# Patient Record
Sex: Female | Born: 1950
Health system: Southern US, Community
[De-identification: ages and names within clinical notes are randomized; demographics above are authoritative.]

## PROBLEM LIST (undated history)

## (undated) ENCOUNTER — Ambulatory Visit (INDEPENDENT_AMBULATORY_CARE_PROVIDER_SITE_OTHER): Admission: EM | Source: Home / Self Care | Attending: Family Medicine | Admitting: Family Medicine

## (undated) DIAGNOSIS — I472 Ventricular tachycardia, unspecified: Secondary | ICD-10-CM

## (undated) DIAGNOSIS — D509 Iron deficiency anemia, unspecified: Secondary | ICD-10-CM

## (undated) DIAGNOSIS — M797 Fibromyalgia: Secondary | ICD-10-CM

## (undated) DIAGNOSIS — I739 Peripheral vascular disease, unspecified: Secondary | ICD-10-CM

## (undated) DIAGNOSIS — I340 Nonrheumatic mitral (valve) insufficiency: Secondary | ICD-10-CM

## (undated) DIAGNOSIS — I447 Left bundle-branch block, unspecified: Secondary | ICD-10-CM

## (undated) DIAGNOSIS — I517 Cardiomegaly: Secondary | ICD-10-CM

## (undated) DIAGNOSIS — E039 Hypothyroidism, unspecified: Secondary | ICD-10-CM

## (undated) DIAGNOSIS — F329 Major depressive disorder, single episode, unspecified: Secondary | ICD-10-CM

## (undated) DIAGNOSIS — K219 Gastro-esophageal reflux disease without esophagitis: Secondary | ICD-10-CM

## (undated) DIAGNOSIS — K7460 Unspecified cirrhosis of liver: Secondary | ICD-10-CM

## (undated) DIAGNOSIS — E119 Type 2 diabetes mellitus without complications: Secondary | ICD-10-CM

## (undated) DIAGNOSIS — G5600 Carpal tunnel syndrome, unspecified upper limb: Secondary | ICD-10-CM

## (undated) DIAGNOSIS — I10 Essential (primary) hypertension: Secondary | ICD-10-CM

## (undated) DIAGNOSIS — I428 Other cardiomyopathies: Secondary | ICD-10-CM

## (undated) DIAGNOSIS — I509 Heart failure, unspecified: Secondary | ICD-10-CM

## (undated) DIAGNOSIS — Z950 Presence of cardiac pacemaker: Secondary | ICD-10-CM

## (undated) DIAGNOSIS — M51369 Other intervertebral disc degeneration, lumbar region without mention of lumbar back pain or lower extremity pain: Secondary | ICD-10-CM

## (undated) DIAGNOSIS — R748 Abnormal levels of other serum enzymes: Secondary | ICD-10-CM

## (undated) DIAGNOSIS — I071 Rheumatic tricuspid insufficiency: Secondary | ICD-10-CM

## (undated) DIAGNOSIS — I272 Pulmonary hypertension, unspecified: Secondary | ICD-10-CM

## (undated) DIAGNOSIS — C50919 Malignant neoplasm of unspecified site of unspecified female breast: Secondary | ICD-10-CM

## (undated) DIAGNOSIS — F32A Depression, unspecified: Secondary | ICD-10-CM

## (undated) DIAGNOSIS — J45909 Unspecified asthma, uncomplicated: Secondary | ICD-10-CM

## (undated) DIAGNOSIS — Z87442 Personal history of urinary calculi: Secondary | ICD-10-CM

## (undated) DIAGNOSIS — S5290XA Unspecified fracture of unspecified forearm, initial encounter for closed fracture: Secondary | ICD-10-CM

## (undated) DIAGNOSIS — I5022 Chronic systolic (congestive) heart failure: Secondary | ICD-10-CM

## (undated) DIAGNOSIS — J189 Pneumonia, unspecified organism: Secondary | ICD-10-CM

## (undated) DIAGNOSIS — G4730 Sleep apnea, unspecified: Secondary | ICD-10-CM

## (undated) DIAGNOSIS — M5136 Other intervertebral disc degeneration, lumbar region: Secondary | ICD-10-CM

## (undated) DIAGNOSIS — K589 Irritable bowel syndrome without diarrhea: Secondary | ICD-10-CM

## (undated) HISTORY — DX: Peripheral vascular disease, unspecified: I73.9

## (undated) HISTORY — DX: Abnormal levels of other serum enzymes: R74.8

## (undated) HISTORY — DX: Carpal tunnel syndrome, unspecified upper limb: G56.00

## (undated) HISTORY — DX: Unspecified fracture of unspecified forearm, initial encounter for closed fracture: S52.90XA

## (undated) HISTORY — DX: Irritable bowel syndrome, unspecified: K58.9

## (undated) HISTORY — DX: Left bundle-branch block, unspecified: I44.7

## (undated) HISTORY — PX: COLONOSCOPY: SHX174

## (undated) HISTORY — DX: Iron deficiency anemia, unspecified: D50.9

## (undated) HISTORY — DX: Nonrheumatic mitral (valve) insufficiency: I34.0

## (undated) HISTORY — DX: Malignant neoplasm of unspecified site of unspecified female breast: C50.919

## (undated) HISTORY — DX: Chronic systolic (congestive) heart failure: I50.22

## (undated) HISTORY — PX: MASTECTOMY: SHX3

## (undated) HISTORY — DX: Heart failure, unspecified: I50.9

## (undated) HISTORY — DX: Ventricular tachycardia, unspecified: I47.20

## (undated) HISTORY — DX: Other cardiomyopathies: I42.8

## (undated) HISTORY — DX: Hypothyroidism, unspecified: E03.9

## (undated) HISTORY — DX: Other intervertebral disc degeneration, lumbar region without mention of lumbar back pain or lower extremity pain: M51.369

## (undated) HISTORY — DX: Pulmonary hypertension, unspecified: I27.20

## (undated) HISTORY — DX: Essential (primary) hypertension: I10

## (undated) HISTORY — PX: KNEE SURGERY: SHX244

## (undated) HISTORY — DX: Major depressive disorder, single episode, unspecified: F32.9

## (undated) HISTORY — DX: Other intervertebral disc degeneration, lumbar region: M51.36

## (undated) HISTORY — PX: TUBAL LIGATION: SHX77

## (undated) HISTORY — PX: REDUCTION MAMMAPLASTY: SUR839

## (undated) HISTORY — DX: Rheumatic tricuspid insufficiency: I07.1

## (undated) HISTORY — DX: Depression, unspecified: F32.A

## (undated) HISTORY — DX: Fibromyalgia: M79.7

## (undated) HISTORY — PX: FOOT SURGERY: SHX648

---

## 1974-08-24 HISTORY — PX: RECTAL SURGERY: SHX760

## 1997-02-26 ENCOUNTER — Encounter: Payer: Self-pay | Admitting: Cardiology

## 1999-10-09 ENCOUNTER — Encounter: Payer: Self-pay | Admitting: Cardiology

## 2001-09-28 ENCOUNTER — Encounter: Payer: Self-pay | Admitting: Cardiology

## 2001-10-18 ENCOUNTER — Encounter: Payer: Self-pay | Admitting: Cardiology

## 2001-11-14 ENCOUNTER — Encounter: Payer: Self-pay | Admitting: Cardiology

## 2002-02-07 ENCOUNTER — Encounter: Payer: Self-pay | Admitting: Cardiology

## 2005-03-18 ENCOUNTER — Encounter: Payer: Self-pay | Admitting: Cardiology

## 2007-08-25 HISTORY — PX: PACEMAKER PLACEMENT: SHX43

## 2008-01-09 ENCOUNTER — Encounter: Payer: Self-pay | Admitting: Cardiology

## 2008-03-26 ENCOUNTER — Encounter: Payer: Self-pay | Admitting: Cardiology

## 2008-03-29 ENCOUNTER — Encounter: Payer: Self-pay | Admitting: Cardiology

## 2009-04-26 ENCOUNTER — Ambulatory Visit: Payer: Self-pay | Admitting: Family Medicine

## 2009-04-26 DIAGNOSIS — I5022 Chronic systolic (congestive) heart failure: Secondary | ICD-10-CM

## 2009-04-26 DIAGNOSIS — E119 Type 2 diabetes mellitus without complications: Secondary | ICD-10-CM | POA: Insufficient documentation

## 2009-04-30 DIAGNOSIS — J309 Allergic rhinitis, unspecified: Secondary | ICD-10-CM | POA: Insufficient documentation

## 2009-06-05 ENCOUNTER — Encounter: Payer: Self-pay | Admitting: Family Medicine

## 2009-06-06 ENCOUNTER — Encounter: Payer: Self-pay | Admitting: Family Medicine

## 2009-06-11 ENCOUNTER — Encounter: Payer: Self-pay | Admitting: Cardiology

## 2009-06-13 ENCOUNTER — Encounter: Payer: Self-pay | Admitting: Family Medicine

## 2009-06-14 ENCOUNTER — Ambulatory Visit: Payer: Self-pay | Admitting: Family Medicine

## 2009-06-22 ENCOUNTER — Encounter: Payer: Self-pay | Admitting: Family Medicine

## 2009-06-26 ENCOUNTER — Encounter: Payer: Self-pay | Admitting: Family Medicine

## 2009-07-02 ENCOUNTER — Ambulatory Visit: Payer: Self-pay | Admitting: Family Medicine

## 2009-07-02 DIAGNOSIS — I10 Essential (primary) hypertension: Secondary | ICD-10-CM | POA: Insufficient documentation

## 2009-07-02 DIAGNOSIS — F329 Major depressive disorder, single episode, unspecified: Secondary | ICD-10-CM | POA: Insufficient documentation

## 2009-07-02 DIAGNOSIS — M549 Dorsalgia, unspecified: Secondary | ICD-10-CM

## 2009-07-02 DIAGNOSIS — G8929 Other chronic pain: Secondary | ICD-10-CM

## 2009-07-02 LAB — CONVERTED CEMR LAB: Albumin/Creatinine Ratio, Urine, POC: <30

## 2009-07-15 ENCOUNTER — Encounter: Payer: Self-pay | Admitting: Cardiology

## 2009-08-12 ENCOUNTER — Telehealth: Payer: Self-pay | Admitting: Family Medicine

## 2009-08-24 DIAGNOSIS — C50919 Malignant neoplasm of unspecified site of unspecified female breast: Secondary | ICD-10-CM

## 2009-08-24 HISTORY — PX: BREAST LUMPECTOMY: SHX2

## 2009-08-24 HISTORY — DX: Malignant neoplasm of unspecified site of unspecified female breast: C50.919

## 2009-09-26 ENCOUNTER — Ambulatory Visit: Payer: Self-pay | Admitting: Family Medicine

## 2009-09-26 DIAGNOSIS — S5290XA Unspecified fracture of unspecified forearm, initial encounter for closed fracture: Secondary | ICD-10-CM | POA: Insufficient documentation

## 2009-09-26 DIAGNOSIS — Z9181 History of falling: Secondary | ICD-10-CM | POA: Insufficient documentation

## 2009-09-26 LAB — CONVERTED CEMR LAB: Blood Glucose, Fingerstick: 122

## 2009-10-01 ENCOUNTER — Encounter: Payer: Self-pay | Admitting: Family Medicine

## 2009-10-03 ENCOUNTER — Ambulatory Visit: Payer: Self-pay | Admitting: Family Medicine

## 2009-10-03 ENCOUNTER — Encounter: Admission: RE | Admit: 2009-10-03 | Discharge: 2009-10-03 | Payer: Self-pay | Admitting: Orthopedic Surgery

## 2009-10-03 DIAGNOSIS — E119 Type 2 diabetes mellitus without complications: Secondary | ICD-10-CM | POA: Insufficient documentation

## 2009-10-03 DIAGNOSIS — E039 Hypothyroidism, unspecified: Secondary | ICD-10-CM | POA: Insufficient documentation

## 2009-10-04 ENCOUNTER — Ambulatory Visit (HOSPITAL_BASED_OUTPATIENT_CLINIC_OR_DEPARTMENT_OTHER): Admission: RE | Admit: 2009-10-04 | Discharge: 2009-10-04 | Payer: Self-pay | Admitting: Orthopedic Surgery

## 2009-10-04 ENCOUNTER — Encounter (INDEPENDENT_AMBULATORY_CARE_PROVIDER_SITE_OTHER): Payer: Self-pay | Admitting: *Deleted

## 2009-10-10 ENCOUNTER — Encounter: Payer: Self-pay | Admitting: Family Medicine

## 2009-10-11 ENCOUNTER — Encounter: Payer: Self-pay | Admitting: Family Medicine

## 2009-10-13 DIAGNOSIS — R748 Abnormal levels of other serum enzymes: Secondary | ICD-10-CM | POA: Insufficient documentation

## 2009-10-14 ENCOUNTER — Encounter: Payer: Self-pay | Admitting: Family Medicine

## 2009-10-14 LAB — CONVERTED CEMR LAB
Albumin: 4 g/dL (ref 3.5–5.2)
BUN: 16 mg/dL (ref 6–23)
CO2: 26 meq/L (ref 19–32)
Chloride: 107 meq/L (ref 96–112)
Glucose, Bld: 122 mg/dL — ABNORMAL HIGH (ref 70–99)
HDL: 55 mg/dL (ref >39)
LDL Cholesterol: 113 mg/dL — ABNORMAL HIGH (ref 0–99)
Platelets: 397 10*3/uL (ref 150–400)
Potassium: 4.4 meq/L (ref 3.5–5.3)
Pro B Natriuretic peptide (BNP): 38.9 pg/mL (ref 0.0–100.0)
RBC: 4.51 M/uL (ref 3.87–5.11)
Sodium: 143 meq/L (ref 135–145)
TSH: 1.424 microintl units/mL (ref 0.350–4.500)
Total CHOL/HDL Ratio: 3.5
Total Protein: 7 g/dL (ref 6.0–8.3)
VLDL: 26 mg/dL (ref 0–40)

## 2009-10-15 ENCOUNTER — Encounter: Admission: RE | Admit: 2009-10-15 | Discharge: 2009-10-15 | Payer: Self-pay | Admitting: Family Medicine

## 2009-10-16 ENCOUNTER — Encounter: Payer: Self-pay | Admitting: Family Medicine

## 2009-10-21 ENCOUNTER — Encounter: Payer: Self-pay | Admitting: Family Medicine

## 2009-10-23 ENCOUNTER — Encounter: Payer: Self-pay | Admitting: Cardiology

## 2009-10-23 ENCOUNTER — Ambulatory Visit: Payer: Self-pay | Admitting: Cardiology

## 2009-10-23 DIAGNOSIS — Z950 Presence of cardiac pacemaker: Secondary | ICD-10-CM | POA: Insufficient documentation

## 2009-10-23 DIAGNOSIS — I447 Left bundle-branch block, unspecified: Secondary | ICD-10-CM

## 2009-10-23 DIAGNOSIS — I442 Atrioventricular block, complete: Secondary | ICD-10-CM | POA: Insufficient documentation

## 2009-10-24 ENCOUNTER — Encounter: Payer: Self-pay | Admitting: Family Medicine

## 2009-10-24 ENCOUNTER — Encounter: Admission: RE | Admit: 2009-10-24 | Discharge: 2009-10-24 | Payer: Self-pay | Admitting: Orthopedic Surgery

## 2009-10-25 ENCOUNTER — Encounter: Payer: Self-pay | Admitting: Internal Medicine

## 2009-10-29 ENCOUNTER — Encounter: Payer: Self-pay | Admitting: Family Medicine

## 2009-10-29 DIAGNOSIS — M797 Fibromyalgia: Secondary | ICD-10-CM

## 2009-11-07 ENCOUNTER — Ambulatory Visit: Payer: Self-pay | Admitting: Family Medicine

## 2009-11-07 DIAGNOSIS — E1169 Type 2 diabetes mellitus with other specified complication: Secondary | ICD-10-CM | POA: Insufficient documentation

## 2009-11-07 DIAGNOSIS — E785 Hyperlipidemia, unspecified: Secondary | ICD-10-CM

## 2009-11-08 ENCOUNTER — Telehealth (INDEPENDENT_AMBULATORY_CARE_PROVIDER_SITE_OTHER): Payer: Self-pay | Admitting: *Deleted

## 2009-12-05 ENCOUNTER — Encounter: Payer: Self-pay | Admitting: Internal Medicine

## 2009-12-05 ENCOUNTER — Ambulatory Visit: Payer: Self-pay | Admitting: Internal Medicine

## 2009-12-16 ENCOUNTER — Encounter: Payer: Self-pay | Admitting: Family Medicine

## 2009-12-19 ENCOUNTER — Ambulatory Visit: Payer: Self-pay

## 2009-12-19 ENCOUNTER — Encounter: Payer: Self-pay | Admitting: Internal Medicine

## 2009-12-19 ENCOUNTER — Ambulatory Visit: Payer: Self-pay | Admitting: Cardiology

## 2009-12-19 ENCOUNTER — Ambulatory Visit (HOSPITAL_COMMUNITY): Admission: RE | Admit: 2009-12-19 | Discharge: 2009-12-19 | Payer: Self-pay | Admitting: Internal Medicine

## 2009-12-24 ENCOUNTER — Telehealth (INDEPENDENT_AMBULATORY_CARE_PROVIDER_SITE_OTHER): Payer: Self-pay | Admitting: *Deleted

## 2010-01-09 ENCOUNTER — Ambulatory Visit: Payer: Self-pay | Admitting: Family Medicine

## 2010-01-09 DIAGNOSIS — R5383 Other fatigue: Secondary | ICD-10-CM

## 2010-01-09 DIAGNOSIS — R5381 Other malaise: Secondary | ICD-10-CM

## 2010-01-09 LAB — CONVERTED CEMR LAB: Hgb A1c MFr Bld: 6.2 %

## 2010-01-10 LAB — CONVERTED CEMR LAB
Iron: 54 ug/dL (ref 42–145)
Saturation Ratios: 17 % — ABNORMAL LOW (ref 20–55)
Vit D, 25-Hydroxy: 41 ng/mL (ref 30–89)

## 2010-01-13 ENCOUNTER — Encounter: Payer: Self-pay | Admitting: Family Medicine

## 2010-01-22 ENCOUNTER — Ambulatory Visit: Payer: Self-pay | Admitting: Family Medicine

## 2010-01-22 DIAGNOSIS — L255 Unspecified contact dermatitis due to plants, except food: Secondary | ICD-10-CM

## 2010-02-25 ENCOUNTER — Telehealth: Payer: Self-pay | Admitting: Family Medicine

## 2010-03-07 ENCOUNTER — Ambulatory Visit: Payer: Self-pay | Admitting: Family Medicine

## 2010-03-10 ENCOUNTER — Encounter: Admission: RE | Admit: 2010-03-10 | Discharge: 2010-03-10 | Payer: Self-pay | Admitting: Family Medicine

## 2010-03-18 DIAGNOSIS — Z853 Personal history of malignant neoplasm of breast: Secondary | ICD-10-CM | POA: Insufficient documentation

## 2010-03-18 DIAGNOSIS — C50212 Malignant neoplasm of upper-inner quadrant of left female breast: Secondary | ICD-10-CM

## 2010-03-19 ENCOUNTER — Encounter: Payer: Self-pay | Admitting: Family Medicine

## 2010-03-19 ENCOUNTER — Encounter: Admission: RE | Admit: 2010-03-19 | Discharge: 2010-03-19 | Payer: Self-pay | Admitting: Family Medicine

## 2010-03-24 ENCOUNTER — Telehealth: Payer: Self-pay | Admitting: Family Medicine

## 2010-03-26 ENCOUNTER — Ambulatory Visit: Payer: Self-pay | Admitting: Family Medicine

## 2010-03-27 ENCOUNTER — Encounter: Payer: Self-pay | Admitting: Cardiology

## 2010-03-27 ENCOUNTER — Encounter: Payer: Self-pay | Admitting: Family Medicine

## 2010-04-02 ENCOUNTER — Ambulatory Visit: Payer: Self-pay | Admitting: Cardiology

## 2010-04-02 ENCOUNTER — Encounter: Payer: Self-pay | Admitting: Physician Assistant

## 2010-04-02 ENCOUNTER — Encounter: Payer: Self-pay | Admitting: Internal Medicine

## 2010-04-04 ENCOUNTER — Ambulatory Visit: Admission: RE | Admit: 2010-04-04 | Discharge: 2010-05-08 | Payer: Self-pay | Admitting: Radiation Oncology

## 2010-04-07 ENCOUNTER — Encounter: Payer: Self-pay | Admitting: Family Medicine

## 2010-04-14 ENCOUNTER — Ambulatory Visit: Payer: Self-pay | Admitting: Internal Medicine

## 2010-04-15 ENCOUNTER — Encounter: Payer: Self-pay | Admitting: Family Medicine

## 2010-04-21 ENCOUNTER — Telehealth: Payer: Self-pay | Admitting: Internal Medicine

## 2010-04-22 ENCOUNTER — Encounter: Payer: Self-pay | Admitting: Family Medicine

## 2010-04-22 ENCOUNTER — Encounter: Admission: RE | Admit: 2010-04-22 | Discharge: 2010-04-22 | Payer: Self-pay | Admitting: General Surgery

## 2010-04-22 ENCOUNTER — Ambulatory Visit (HOSPITAL_COMMUNITY): Admission: RE | Admit: 2010-04-22 | Discharge: 2010-04-22 | Payer: Self-pay | Admitting: General Surgery

## 2010-04-24 ENCOUNTER — Ambulatory Visit: Payer: Self-pay | Admitting: Oncology

## 2010-04-25 ENCOUNTER — Encounter: Payer: Self-pay | Admitting: Cardiology

## 2010-04-30 ENCOUNTER — Encounter: Payer: Self-pay | Admitting: Internal Medicine

## 2010-05-08 ENCOUNTER — Ambulatory Visit: Payer: Self-pay | Admitting: Family Medicine

## 2010-05-08 LAB — CONVERTED CEMR LAB: Hgb A1c MFr Bld: 6.2 %

## 2010-05-13 ENCOUNTER — Encounter: Payer: Self-pay | Admitting: Family Medicine

## 2010-05-13 ENCOUNTER — Encounter: Payer: Self-pay | Admitting: Cardiology

## 2010-05-19 ENCOUNTER — Encounter: Payer: Self-pay | Admitting: Family Medicine

## 2010-05-21 ENCOUNTER — Ambulatory Visit: Payer: Self-pay | Admitting: Internal Medicine

## 2010-05-22 ENCOUNTER — Telehealth: Payer: Self-pay | Admitting: Family Medicine

## 2010-05-26 ENCOUNTER — Ambulatory Visit: Payer: Self-pay | Admitting: Oncology

## 2010-05-26 ENCOUNTER — Encounter: Payer: Self-pay | Admitting: Family Medicine

## 2010-05-27 ENCOUNTER — Encounter: Payer: Self-pay | Admitting: Family Medicine

## 2010-06-02 ENCOUNTER — Encounter: Payer: Self-pay | Admitting: Family Medicine

## 2010-06-23 ENCOUNTER — Encounter: Payer: Self-pay | Admitting: Family Medicine

## 2010-06-26 ENCOUNTER — Encounter: Payer: Self-pay | Admitting: Family Medicine

## 2010-07-14 ENCOUNTER — Ambulatory Visit: Payer: Self-pay | Admitting: Family Medicine

## 2010-07-14 DIAGNOSIS — N898 Other specified noninflammatory disorders of vagina: Secondary | ICD-10-CM | POA: Insufficient documentation

## 2010-07-15 ENCOUNTER — Encounter: Payer: Self-pay | Admitting: Family Medicine

## 2010-07-15 LAB — CONVERTED CEMR LAB
Clue Cells Wet Prep HPF POC: NONE SEEN
Trich, Wet Prep: NONE SEEN
Yeast Wet Prep HPF POC: NONE SEEN

## 2010-07-18 ENCOUNTER — Ambulatory Visit: Payer: Self-pay | Admitting: Oncology

## 2010-07-21 ENCOUNTER — Encounter (INDEPENDENT_AMBULATORY_CARE_PROVIDER_SITE_OTHER): Payer: Self-pay | Admitting: *Deleted

## 2010-07-21 ENCOUNTER — Encounter: Payer: Self-pay | Admitting: Family Medicine

## 2010-07-21 LAB — COMPREHENSIVE METABOLIC PANEL
AST: 15 U/L (ref 0–37)
Alkaline Phosphatase: 72 U/L (ref 39–117)
BUN: 19 mg/dL (ref 6–23)
CO2: 26 mEq/L (ref 19–32)
Chloride: 103 mEq/L (ref 96–112)
Creatinine, Ser: 0.67 mg/dL (ref 0.40–1.20)
Glucose, Bld: 93 mg/dL (ref 70–99)
Total Bilirubin: 0.3 mg/dL (ref 0.3–1.2)

## 2010-07-21 LAB — CBC WITH DIFFERENTIAL/PLATELET
HGB: 12.2 g/dL (ref 11.6–15.9)
MONO#: 0.5 10*3/uL (ref 0.1–0.9)
NEUT#: 4.6 10*3/uL (ref 1.5–6.5)
NEUT%: 62.3 % (ref 38.4–76.8)
Platelets: 317 10*3/uL (ref 145–400)
RBC: 4.2 10*6/uL (ref 3.70–5.45)
RDW: 14.8 % — ABNORMAL HIGH (ref 11.2–14.5)
WBC: 7.4 10*3/uL (ref 3.9–10.3)

## 2010-08-26 ENCOUNTER — Ambulatory Visit
Admission: RE | Admit: 2010-08-26 | Discharge: 2010-08-26 | Payer: Self-pay | Source: Home / Self Care | Attending: Family Medicine | Admitting: Family Medicine

## 2010-08-26 LAB — CONVERTED CEMR LAB
Albumin/Creatinine Ratio, Urine, POC: <30
Microalbumin U total vol: 10 mg/L

## 2010-08-28 ENCOUNTER — Encounter: Payer: Self-pay | Admitting: Family Medicine

## 2010-09-02 ENCOUNTER — Encounter: Payer: Self-pay | Admitting: Family Medicine

## 2010-09-14 ENCOUNTER — Encounter: Payer: Self-pay | Admitting: Family Medicine

## 2010-09-23 NOTE — Letter (Signed)
Summary: Letter Regarding Labs/[facility]  Letter Regarding Labs/[facility]   Imported By: Edmonia James 10/24/2009 10:12:09  _____________________________________________________________________  External Attachment:    Type:   Image     Comment:   External Document

## 2010-09-23 NOTE — Assessment & Plan Note (Signed)
Summary: OK PER KELLY/DX BREAST CA/PACEMAKER/D.MILLER   Visit Type:  rov Primary Tanya Hogan:  Tanya Gambler DO  CC:  pt states she fell 2 days ago....denies any cardiac complaints today.  History of Present Illness: Tanya Hogan returns today for pre-op evaluation.  She has been diagnosed with breast CA and is scheduled either for lumpectomy or total mastectomy, she is not sure which.  The patient is scheduled to see Dr. Dalbert Batman in the next few days.  She has a h/o CHF which has been well controlled with medical therapy and her BiV PM.  The patient denies syncope.  She quite anxious about her treatment for her breast CA.  Current Medications (verified): 1)  Losartan Potassium 50 Mg Tabs (Losartan Potassium) .[name].. 1 Tab By Mouth Bid 2)  Lantus 100 Unit/ml Soln (Insulin Glargine) .... Inject 15 Units in The Morning 3)  Arthritis Pain Relief 650 Mg Cr-Tabs (Acetaminophen) .... Take Two Tablets By Mouth Once A Day 4)  Cymbalta 60 Mg Cpep (Duloxetine Hcl) .... Take One Tablet By Mouth Once Ad Ay 5)  Synthroid 50 Mcg Tabs (Levothyroxine Sodium) .[name].. 1 Tab By Mouth Daily 6)  Isosorbide Mononitrate Cr 30 Mg Xr24h-Tab (Isosorbide Mononitrate) .[name].. 1  Tab Once Daily 7)  Carvedilol 6.25 Mg Tabs (Carvedilol) .... Take One Tablet By Mouth Twice A Day 8)  Gabapentin 600 Mg Tabs (Gabapentin) .... Take 2 Tab By Mouth Three Times A Day 9)  Bumetanide 1 Mg Tabs (Bumetanide) .... Take 1 Tablet By Mouth Two Times A Day 10)  Multivitamins  Tabs (Multiple Vitamin) .... Take One Tablet By Mouth Once Ad Ay 11)  Mucinex 600 Mg Xr12h-Tab (Guaifenesin) .... Take One Tablet By Mouth Once Ad Ay 12)  Calcium 600 1500 Mg Tabs (Calcium Carbonate) .... 2 Tablets Once A Day 13)  Advair Diskus 250-50 Mcg/dose Aepb (Fluticasone-Salmeterol) .... Use One Puff Twice A Day 14)  Bd Insulin Syringe 27.5g X 5/8" 2 Ml Misc (Insulin Syringe-Needle U-100) .... Use As Directed 15)  Freestyle Lite Test  Strp (Glucose Blood) .... Use Twice Daily  As Directed 16)  Nexium 40 Mg Cpdr (Esomeprazole Magnesium) .... Take 1 Capsule By Mouth Once A Day 17)  Singulair 10 Mg Tabs (Montelukast Sodium) .... Take 1 Tablet By Mouth Once A Day 18)  Restasis 0.05 % Emul (Cyclosporine) .... One Drop Two Times A Day 19)  Tizanidine Hcl 4 Mg Tabs (Tizanidine Hcl) .... Take 2 Tablet By Mouth Two Times A Day 20)  Proair Hfa 108 (90 Base) Mcg/act Aers (Albuterol Sulfate) .... As Needed 21)  Accu-Chek Soft Touch Lancets  Misc (Lancets) .... Use Twice Daily As Directed 22)  Pravastatin Sodium 40 Mg Tabs (Pravastatin Sodium) .[name].. 1 Tab By Mouth Qhs 23)  Allegra 180 Mg Tabs (Fexofenadine Hcl) .[name].. 1 Tab By Mouth Daily 24)  Hyoscyamine Sulfate 0.125 Mg Tabs (Hyoscyamine Sulfate) .... As Needed 25)  Megared Capsule .... Take 1 Capsule By Mouth Once A Day 26)  Diltiazem Hcl Er Beads 120 Mg Xr24h-Cap (Diltiazem Hcl Er Beads) .... Take 1 Capsule By Mouth Two Times A Day  Allergies: 1)  ! Quinine 2)  ! Sulfa 3)  ! Pcn 4)  ! Rifadin (Rifampin) 5)  ! Clindamycin 6)  ! Accupril 7)  ! * Baking Soda 8)  ! Codeine  Past History:  Past Medical History: Last updated: 10/23/2009 Asthma H/O PUD ESSENTIAL HYPERTENSION, BENIGN (ICD-401.1) CHF (ICD-428.0) LBBB ALKALINE PHOSPHATASE, ELEVATED (ICD-790.5) DIABETIC PERIPHERAL NEUROPATHY (ICD-250.60) UNSPECIFIED HYPOTHYROIDISM (ICD-244.9)  CLOSED FRACTURE OF UNSPECIFIED PART OF RADIUS (ICD-813.81) DEPRESSION (ICD-311) BACK PAIN, CHRONIC (ICD-724.5) ALLERGIC RHINITIS CAUSE UNSPECIFIED (ICD-477.9) DM (ICD-250.00) anxiety/ depression Lumbar DDD, chronic pain, Dr Sabra Heck at [facility] Neurologic carpal tunnel IBS  Past Surgical History: Last updated: 10/23/2009 pacemaker 2009 [facility] knee and foot surgery hysterectomy at 78 for bleeding Tubal ligation pen reduction and internal fixation, right distal  radius fracture, 2 pieces, with carpal tunnel release.   Review of Systems       All systems  reviewed and negative except as noted in the HPI  Vital Signs:  Patient profile:   60 year old female Height:      62.1 inches Weight:      208.8 pounds BMI:     38.20 Pulse rate:   71 / minute Pulse rhythm:   regular BP sitting:   120 / 80  (left arm) Cuff size:   large  Vitals Entered By: Julaine Hua, CMA (April 14, 2010 4:42 PM)  Physical Exam  General:   Well-nournished, in no acute distress. Neck: No JVD, HJR, Bruit, or thyroid enlargement Lungs: No tachypnea, clear without wheezing, rales, or rhonchi Cardiovascular: RRR, PMI not displaced, heart sounds normal, no murmurs, gallops, bruit, thrill, or heave. Well healed PM incision. Abdomen: BS normal. Soft without organomegaly, masses, lesions or tenderness. Extremities: without cyanosis, clubbing or edema. Good distal pulses bilateral SKin: Warm, no lesions or rashes  Musculoskeletal: No deformities Neuro: no focal signs    PPM Specifications Following MD:  Tanya Peru, MD     Referring MD:  CRENSHAW PPM Vendor:  [employer]     PPM Model Number:  [id number]     PPM Serial Number:  [id number] PPM DOI:  11/14/2001     PPM Implanting MD:  NOT IMPLANTED HERE  Lead 1    Location: RA     DOI: 11/14/2001     Model #: [id number]     Serial #: [id number]     Status: active Lead 2    Location: RV     DOI: 11/14/2001     Model #: 3086     Serial #: [id number]     Status: active Lead 3    Location: LV     DOI: 11/14/2001     Model #: [id number]     Serial #: [id number]     Status: active  Magnet Response Rate:  BOL 85 ERI 65  Indications:  CHF   PPM Follow Up Battery Voltage:  2.985 V     Battery Est. Longevity:  4.5 yrs     Pacer Dependent:  Yes       PPM Device Measurements Atrium  Amplitude: 2.80 mV, Impedance: 627 ohms, Threshold: 1.00 V at 0.50 msec Right Ventricle  Amplitude: PACED mV, Impedance: 696 ohms, Threshold: 1.00 V at 0.45 msec Left Ventricle  Impedance: 1337 ohms, Threshold: 1.00 V at 0.80 msec Configuration: LV TIP TO RV  RING  Episodes MS Episodes:  1     Percent Mode Switch:  0%     Coumadin:  No Ventricular High Rate:  0     Atrial Pacing:  15.5%     Ventricular Pacing:  99.9%  Parameters Mode:  DDD     Lower Rate Limit:  70     Upper Rate Limit:  140 Paced AV Delay:  140     Sensed AV Delay:  130 Tech Comments:  PACER DEPENDENT.  1 AHR EPISODE LASTING 18 SECONDS.  NORMAL DEVICE  FUNCTION.  NO CHANGES MADE.  ROV IN 6 MTHS W/DEVICE CLINIC. Shelly Bombard  April 14, 2010 4:53 PM MD Comments:  Agree with above.  Impression & Recommendations:  Problem # 1:  PACEMAKER, PERMANENT (ICD-V45.01) Her device is working normally.  Options for treatment of her breast CA would be to leave the device in place.  This would be my first choice with the idea of trying any alternative rather than reposition the device.  If movement of the device is mandatory, then several options are available and I will talk to her surgeon and radiation experts.  Problem # 2:  PRE-OPERATIVE CARDIAC EXAM (ICD-V72.81) Her surgical risk from an upcoming breast resection is low.  Her CHF is well compensated. Her updated medication list for this problem includes:    Isosorbide Mononitrate Cr 30 Mg Xr24h-tab (Isosorbide mononitrate) .[name]... 1  tab once daily    Carvedilol 6.25 Mg Tabs (Carvedilol) .[name]... Take one tablet by mouth twice a day    Diltiazem Hcl Er Beads 120 Mg Xr24h-cap (Diltiazem hcl er beads) .[name]... Take 1 capsule by mouth two times a day  Patient Instructions: 1)  Your physician recommends that you schedule a follow-up appointment in: 6 weeks pacer clinic

## 2010-09-23 NOTE — Procedures (Signed)
Summary: [facility]   Current Medications (verified): 1)  Losartan Potassium 50 Mg Tabs (Losartan Potassium) .[name].. 1 Tab By Mouth Bid 2)  Lantus 100 Unit/ml Soln (Insulin Glargine) .... Inject 15 Units in The Morning 3)  B-100 Complex  Tabs (Vitamins-Lipotropics) .... Take One Tablet By Mouth Once A Day 4)  Glucosamine-Chondroitin 250-200 Mg Caps (Glucosamine-Chondroitin) .... Take One Tablet By Mouth Twice Ad Ay 5)  Arthritis Pain Relief 650 Mg Cr-Tabs (Acetaminophen) .... Take Two Tablets By Mouth Once A Day 6)  Cymbalta 60 Mg Cpep (Duloxetine Hcl) .... Take One Tablet By Mouth Once Ad Ay 7)  Synthroid 50 Mcg Tabs (Levothyroxine Sodium) .[name].. 1 Tab By Mouth Daily 8)  Isosorbide Dinitrate 30 Mg Tabs (Isosorbide Dinitrate) .... Take One Tablet By Mouth Once Ad Ay 9)  Carvedilol 6.25 Mg Tabs (Carvedilol) .... Take One Tablet By Mouth Twice A Day 10)  Gabapentin 600 Mg Tabs (Gabapentin) .... Take 1 Tab By Mouth Three Times A Day 11)  Bumetanide 1 Mg Tabs (Bumetanide) .... Take 1 Tablet By Mouth Two Times A Day 12)  Multivitamins  Tabs (Multiple Vitamin) .... Take One Tablet By Mouth Once Ad Ay 13)  Mucinex 600 Mg Xr12h-Tab (Guaifenesin) .... Take One Tablet By Mouth Once Ad Ay 14)  Calcium 600 1500 Mg Tabs (Calcium Carbonate) .... 2 Tablets Once A Day 15)  Advair Diskus 250-50 Mcg/dose Aepb (Fluticasone-Salmeterol) .... Use One Puff Twice A Day 16)  Bd Insulin Syringe 27.5g X 5/8" 2 Ml Misc (Insulin Syringe-Needle U-100) .... Use As Directed 17)  Freestyle Lite Test  Strp (Glucose Blood) .... Use Twice Daily As Directed 18)  Nexium 40 Mg Cpdr (Esomeprazole Magnesium) .... Take 1 Capsule By Mouth Once A Day 19)  Singulair 10 Mg Tabs (Montelukast Sodium) .... Take 1 Tablet By Mouth Once A Day 20)  Restasis 0.05 % Emul (Cyclosporine) .... One Drop Two Times A Day 21)  Tizanidine Hcl 4 Mg Tabs (Tizanidine Hcl) .... 2 Tablets Two Times A Day 22)  Proair Hfa 108 (90 Base) Mcg/act Aers  (Albuterol Sulfate) .... As Needed 23)  Accu-Chek Soft Touch Lancets  Misc (Lancets) .... Use Twice Daily As Directed 24)  Pravastatin Sodium 40 Mg Tabs (Pravastatin Sodium) .[name].. 1 Tab By Mouth Qhs 25)  Allegra 180 Mg Tabs (Fexofenadine Hcl) .[name].. 1 Tab By Mouth Daily 26)  Glucosamine-Chondroitin   Caps (Glucosamine-Chondroit-Vit C-Mn) .... Take One Tablet By Mouth Twice Daily.  Allergies: 1)  ! Quinine 2)  ! Sulfa 3)  ! Pcn 4)  ! Rifadin (Rifampin) 5)  ! Clindamycin 6)  ! Accupril 7)  ! * Baking Soda  PPM Specifications Following MD:  Cristopher Peru, MD     Referring MD:  Conley Simmonds Vendor:  [employer]     PPM Model Number:  [id number]     PPM Serial Number:  [id number] PPM DOI:  11/14/2001     PPM Implanting MD:  NOT IMPLANTED HERE  Lead 1    Location: RA     DOI: 11/14/2001     Model #: [id number]     Serial #: [id number]     Status: active Lead 2    Location: RV     DOI: 11/14/2001     Model #: 7893     Serial #: [id number]     Status: active Lead 3    Location: LV     DOI: 11/14/2001     Model #: [id number]  Serial #: [id number]     Status: active  Magnet Response Rate:  BOL 85 ERI 65  Indications:  CHF   PPM Follow Up Remote Check?  No Battery Voltage:  2.986 V     Battery Est. Longevity:  4.5 years     Pacer Dependent:  Yes       PPM Device Measurements Atrium  Impedance: 627 ohms,  Right Ventricle  Impedance: 670 ohms,  Left Ventricle  Impedance: 1198 ohms,  Configuration: LV TIP TO RV RING  Episodes MS Episodes:  6     Percent Mode Switch:  0%     Coumadin:  No Ventricular High Rate:  0     Atrial Pacing:  12.7%     Ventricular Pacing:  100%  Parameters Mode:  DDD     Lower Rate Limit:  70     Upper Rate Limit:  140 Paced AV Delay:  140     Sensed AV Delay:  130 Tech Comments:  Ms. Arizmendi was seen today for surgical clearance for left breast CA .  Interrogation only no parameter changes.   Alma Friendly, LPN  April 02, 6766 12:47 PM  MD Comments:  Agree with above.

## 2010-09-23 NOTE — Consult Note (Signed)
Summary: [facility]  [facility]   Imported By: Edmonia James 01/27/2010 09:35:18  _____________________________________________________________________  External Attachment:    Type:   Image     Comment:   External Document

## 2010-09-23 NOTE — Consult Note (Signed)
Summary: [facility]   Imported By: Marilynne Drivers 11/21/2009 13:21:12  _____________________________________________________________________  External Attachment:    Type:   Image     Comment:   External Document

## 2010-09-23 NOTE — Cardiovascular Report (Signed)
Summary: Office Visit   Office Visit   Imported By: Sallee Provencal 04/18/2010 10:24:43  _____________________________________________________________________  External Attachment:    Type:   Image     Comment:   External Document

## 2010-09-23 NOTE — Letter (Signed)
Summary: [facility]  [facility]   Imported By: Edmonia James 10/09/2009 10:29:13  _____________________________________________________________________  External Attachment:    Type:   Image     Comment:   External Document

## 2010-09-23 NOTE — Assessment & Plan Note (Signed)
Summary: f/u DM   Vital Signs:  Patient profile:   60 year old female Height:      62.1 inches Weight:      206 pounds BMI:     37.69 O2 Sat:      98 % on Room air Pulse rate:   90 / minute BP sitting:   120 / 74  (left arm) Cuff size:   large  Vitals Entered By: Sherlean Foot CMA (May 08, 2010 9:00 AM)  O2 Flow:  Room air CC: F/U DM   Primary Care Brinklee Cisse:  Loyal Gambler DO  CC:  F/U DM.  History of Present Illness: 60 yo WF presents for f/u DM.  She has L breast cancer.  She is seeing Dr Dalbert Batman, s/p a L breast surgery 8-30 and did Mamm[facility] radiation which she just completed.  She is feeling fatigued and has breast redness.  She has yet to see the medical oncologist.    Her sugars are running low 100s - 130 AM fasting.  Her bedtime readings are 90s -120s.  She is not exercising since she is post op and undergoing radiation (no chemo).    Current Medications (verified): 1)  Losartan Potassium 50 Mg Tabs (Losartan Potassium) .[name].. 1 Tab By Mouth Bid 2)  Lantus 100 Unit/ml Soln (Insulin Glargine) .... Inject 15 Units in The Morning 3)  Arthritis Pain Relief 650 Mg Cr-Tabs (Acetaminophen) .... Take Two Tablets By Mouth Once A Day 4)  Cymbalta 60 Mg Cpep (Duloxetine Hcl) .... Take One Tablet By Mouth Once Ad Ay 5)  Synthroid 50 Mcg Tabs (Levothyroxine Sodium) .[name].. 1 Tab By Mouth Daily 6)  Isosorbide Mononitrate Cr 30 Mg Xr24h-Tab (Isosorbide Mononitrate) .[name].. 1  Tab Once Daily 7)  Carvedilol 6.25 Mg Tabs (Carvedilol) .... Take One Tablet By Mouth Twice A Day 8)  Gabapentin 600 Mg Tabs (Gabapentin) .... Take 2 Tab By Mouth Three Times A Day 9)  Bumetanide 1 Mg Tabs (Bumetanide) .... Take 1 Tablet By Mouth Two Times A Day 10)  Multivitamins  Tabs (Multiple Vitamin) .... Take One Tablet By Mouth Once Ad Ay 11)  Mucinex 600 Mg Xr12h-Tab (Guaifenesin) .... Take One Tablet By Mouth Once Ad Ay 12)  Calcium 600 1500 Mg Tabs (Calcium Carbonate) .... 2 Tablets Once A Day 13)   Advair Diskus 250-50 Mcg/dose Aepb (Fluticasone-Salmeterol) .... Use One Puff Twice A Day 14)  Bd Insulin Syringe 27.5g X 5/8" 2 Ml Misc (Insulin Syringe-Needle U-100) .... Use As Directed 15)  Freestyle Lite Test  Strp (Glucose Blood) .... Use Twice Daily As Directed 16)  Nexium 40 Mg Cpdr (Esomeprazole Magnesium) .... Take 1 Capsule By Mouth Once A Day 17)  Singulair 10 Mg Tabs (Montelukast Sodium) .... Take 1 Tablet By Mouth Once A Day 18)  Restasis 0.05 % Emul (Cyclosporine) .... One Drop Two Times A Day 19)  Tizanidine Hcl 4 Mg Tabs (Tizanidine Hcl) .... Take 2 Tablet By Mouth Two Times A Day 20)  Proair Hfa 108 (90 Base) Mcg/act Aers (Albuterol Sulfate) .... As Needed 21)  Accu-Chek Soft Touch Lancets  Misc (Lancets) .... Use Twice Daily As Directed 22)  Pravastatin Sodium 40 Mg Tabs (Pravastatin Sodium) .[name].. 1 Tab By Mouth Qhs 23)  Allegra 180 Mg Tabs (Fexofenadine Hcl) .[name].. 1 Tab By Mouth Daily 24)  Hyoscyamine Sulfate 0.125 Mg Tabs (Hyoscyamine Sulfate) .... As Needed 25)  Megared Capsule .... Take 1 Capsule By Mouth Once A Day 26)  Diltiazem Hcl Er  Beads 120 Mg Xr24h-Cap (Diltiazem Hcl Er Beads) .... Take 1 Capsule By Mouth Two Times A Day  Allergies (verified): 1)  ! Quinine 2)  ! Sulfa 3)  ! Pcn 4)  ! Rifadin (Rifampin) 5)  ! Clindamycin 6)  ! Accupril 7)  ! * Baking Soda 8)  ! Codeine  Past History:  Past Medical History: Asthma H/O PUD ESSENTIAL HYPERTENSION, BENIGN (ICD-401.1) CHF (ICD-428.0) LBBB ALKALINE PHOSPHATASE, ELEVATED (ICD-790.5) DIABETIC PERIPHERAL NEUROPATHY (ICD-250.60) UNSPECIFIED HYPOTHYROIDISM (ICD-244.9) CLOSED FRACTURE OF UNSPECIFIED PART OF RADIUS (ICD-813.81) DEPRESSION (ICD-311) BACK PAIN, CHRONIC (ICD-724.5) ALLERGIC RHINITIS CAUSE UNSPECIFIED (ICD-477.9) DM (ICD-250.00) anxiety/ depression Lumbar DDD, chronic pain, Dr Sabra Heck at [facility] Neurologic carpal tunnel IBS L breast cancer 03-2010  Past Surgical History: pacemaker 2009  [facility] knee and foot surgery hysterectomy at 1 for bleeding Tubal ligation pen reduction and internal fixation, right distal  radius fracture, 2 pieces, with carpal tunnel release.  lumpectomy 04-2010, Dr Dalbert Batman for breast cancer  Social History: Reviewed history from 10/23/2009 and no changes required. newly divorced. Finished grade school only. Brother lives w/ her.  2 kids in [place] - a son and a daughter. Quit smoking in 1969. Walks 2 days/ wk. Alcohol Use - no  Review of Systems      See HPI  Physical Exam  General:  alert, well-developed, well-nourished, and well-hydrated.  obese, in NAD Head:  normocephalic and atraumatic.   Eyes:  pupils equal, pupils round, and pupils reactive to light.   Mouth:  pharynx pink and moist and fair dentition.   Neck:  no masses.   Breasts:  erythema over the lateral L breast (from radiation) with well healing surgical wounds, bandage removed to check site under lateral breast folds Lungs:  Normal respiratory effort, chest expands symmetrically. Lungs are clear to auscultation, no crackles or wheezes. Heart:  normal rate, regular rhythm, and no murmur.   Extremities:  no LE edema Skin:  color normal.   Psych:  good eye contact, not anxious appearing, and not depressed appearing.     Impression & Recommendations:  Problem # 1:  DM (ICD-250.00) A1C at goal.  Continue current meds.  I did send her home with a Lantus Solostar pen to try instead of the vials.   Her updated medication list for this problem includes:    Losartan Potassium 50 Mg Tabs (Losartan potassium) .[name]... 1 tab by mouth bid    Lantus 100 Unit/ml Soln (Insulin glargine) ..... Inject 15 units in the morning  Orders: Fingerstick (93570) Hgb A1C (17793JQ)  Labs Reviewed: Creat: 0.66 (10/10/2009)   Microalbumin: 10 (07/02/2009)  Last Eye Exam: no retinopathy (Dr Laban Emperor) (06/13/2009) Reviewed HgBA1c results: 6.2 (05/08/2010)  6.2 (01/09/2010)  Problem # 2:   INVASIVE DUCTAL CARCINOMA, LEFT BREAST (ICD-174.9) I removed her dressing today.  Incisions look great and are healing well.  We redressed her wounds under the lateral breast fold with a small gauze dressing/ abx ointment.  She is to f/u with dr Dalbert Batman.  She is completing radiation thru mammosite and seems to be doing well.  Getting her rest. Brother is helping her at home. Spirits are good.  Has f/u with rad onc and ? heme/ onc, though no chemotherapy has been recommended.    Problem # 3:  HYPERLIPIDEMIA (ZES-923.4) Continue statin.  Labs are UTD.   Her updated medication list for this problem includes:    Pravastatin Sodium 40 Mg Tabs (Pravastatin sodium) .[name]... 1 tab by mouth qhs  Labs Reviewed:  SGOT: 21 (10/10/2009)   SGPT: 35 (10/10/2009)   HDL:55 (10/10/2009)  LDL:113 (10/10/2009)  Chol:194 (10/10/2009)  Trig:130 (10/10/2009)  Problem # 4:  ESSENTIAL HYPERTENSION, BENIGN (ICD-401.1) BP perfect.  Continue current meds.   Her updated medication list for this problem includes:    Losartan Potassium 50 Mg Tabs (Losartan potassium) .[name]... 1 tab by mouth bid    Carvedilol 6.25 Mg Tabs (Carvedilol) .[name]... Take one tablet by mouth twice a day    Bumetanide 1 Mg Tabs (Bumetanide) .[name]... Take 1 tablet by mouth two times a day    Diltiazem Hcl Er Beads 120 Mg Xr24h-cap (Diltiazem hcl er beads) .[name]... Take 1 capsule by mouth two times a day  BP today: 120/74 Prior BP: 120/80 (04/14/2010)  Labs Reviewed: K+: 4.4 (10/10/2009) Creat: : 0.66 (10/10/2009)   Chol: 194 (10/10/2009)   HDL: 55 (10/10/2009)   LDL: 113 (10/10/2009)   TG: 130 (10/10/2009)  Complete Medication List: 1)  Losartan Potassium 50 Mg Tabs (Losartan potassium) .[name].. 1 tab by mouth bid 2)  Lantus 100 Unit/ml Soln (Insulin glargine) .... Inject 15 units in the morning 3)  Arthritis Pain Relief 650 Mg Cr-tabs (Acetaminophen) .... Take two tablets by mouth once a day 4)  Cymbalta 60 Mg Cpep (Duloxetine hcl) .... Take one tablet by  mouth once ad ay 5)  Synthroid 50 Mcg Tabs (Levothyroxine sodium) .[name].. 1 tab by mouth daily 6)  Isosorbide Mononitrate Cr 30 Mg Xr24h-tab (Isosorbide mononitrate) .[name].. 1  tab once daily 7)  Carvedilol 6.25 Mg Tabs (Carvedilol) .... Take one tablet by mouth twice a day 8)  Gabapentin 600 Mg Tabs (Gabapentin) .... Take 2 tab by mouth three times a day 9)  Bumetanide 1 Mg Tabs (Bumetanide) .... Take 1 tablet by mouth two times a day 10)  Multivitamins Tabs (Multiple vitamin) .... Take one tablet by mouth once ad ay 11)  Mucinex 600 Mg Xr12h-tab (Guaifenesin) .... Take one tablet by mouth once ad ay 12)  Calcium 600 1500 Mg Tabs (Calcium carbonate) .... 2 tablets once a day 13)  Advair Diskus 250-50 Mcg/dose Aepb (Fluticasone-salmeterol) .... Use one puff twice a day 14)  Bd Insulin Syringe 27.5g X 5/8" 2 Ml Misc (Insulin syringe-needle u-100) .... Use as directed 15)  Freestyle Lite Test Strp (Glucose blood) .... Use twice daily as directed 16)  Nexium 40 Mg Cpdr (Esomeprazole magnesium) .... Take 1 capsule by mouth once a day 17)  Singulair 10 Mg Tabs (Montelukast sodium) .... Take 1 tablet by mouth once a day 18)  Restasis 0.05 % Emul (Cyclosporine) .... One drop two times a day 19)  Tizanidine Hcl 4 Mg Tabs (Tizanidine hcl) .... Take 2 tablet by mouth two times a day 20)  Proair Hfa 108 (90 Base) Mcg/act Aers (Albuterol sulfate) .... As needed 21)  Accu-chek Soft Touch Lancets Misc (Lancets) .... Use twice daily as directed 22)  Pravastatin Sodium 40 Mg Tabs (Pravastatin sodium) .[name].. 1 tab by mouth qhs 23)  Allegra 180 Mg Tabs (Fexofenadine hcl) .[name].. 1 tab by mouth daily 24)  Hyoscyamine Sulfate 0.125 Mg Tabs (Hyoscyamine sulfate) .... As needed 25)  Megared Capsule  .... Take 1 capsule by mouth once a day 26)  Diltiazem Hcl Er Beads 120 Mg Xr24h-cap (Diltiazem hcl er beads) .... Take 1 capsule by mouth two times a day 27)  Bd Pen Needle Ultrafine 29g X 12.51m Misc (Insulin pen needle) ....  Use daily as directed  Other Orders: Future Orders: Flu  Vaccine 56yr + MEDICARE PATIENTS (([id number] ... 05/09/2010 Administration Flu vaccine - MCR (G0008) ... 05/09/2010  Patient Instructions: 1)  A1C perfect at 6.2!  2)  Try the Lantus Solostar Pen instead of the vials. Stay on 15 units with breakfast each day. 3)  Your surgical wound looks great.   4)  Flu shot today. 5)  Return for follow up in 3-4 mos. Prescriptions: BD PEN NEEDLE ULTRAFINE 29G X 12.7MM MISC (INSULIN PEN NEEDLE) use daily as directed  #30 x 12   Entered and Authorized by:   KLoyal GamblerDO   Signed by:   KLoyal GamblerDO on 05/08/2010   Method used:   Electronically to        [facility]* # [phone] (retail)       [address] [place]  [id number]      Ph: [phone]      Fax: [phone]  RxID:   [id number] 50 MCG TABS (LEVOTHYROXINE SODIUM) 1 tab by mouth daily  #90 x 0   Entered and Authorized by:   KLoyal GamblerDO   Signed by:   KLoyal GamblerDO on 05/08/2010   Method used:   Print then Give to Patient   RxID:   [id number] SODIUM 40 MG TABS (PRAVASTATIN SODIUM) 1 tab by mouth qhs  #90 x 1   Entered and Authorized by:   KLoyal GamblerDO   Signed by:   KLoyal GamblerDO on 05/08/2010   Method used:   Print then Give to Patient   RxID:   [id number] POTASSIUM 50 MG TABS (LOSARTAN POTASSIUM) 1 tab by mouth bid  #180 x 1   Entered and Authorized by:   KLoyal GamblerDO   Signed by:   KLoyal GamblerDO on 05/08/2010   Method used:   Print then Give to Patient   RxID:   [id number]  Laboratory Results   Blood Tests     HGBA1C: 6.2%   (Normal Range: Non-Diabetic - 3-6%   Control Diabetic - 6-8%)    Flu Vaccine Consent Questions     Do you have a history of severe allergic reactions to this vaccine? no    Any prior history of allergic reactions to egg and/or gelatin? no    Do you have a sensitivity to the preservative  Thimersol? no    Do you have a past history of Guillan-Barre Syndrome? no    Do you currently have an acute febrile illness? no    Have you ever had a severe reaction to latex? no    Vaccine information given and explained to patient? yes    Are you currently pregnant? no    Lot Number:[id number]   Exp Date:02/21/2011   Site Given  Left Deltoid IM2%   (Normal Range: Non-Diabetic - 3-6%   Control Diabetic - 6-8%)        .lbmedflu

## 2010-09-23 NOTE — Cardiovascular Report (Signed)
Summary: Office Visit   Office Visit   Imported By: Sallee Provencal 04/24/2010 15:45:49  _____________________________________________________________________  External Attachment:    Type:   Image     Comment:   External Document

## 2010-09-23 NOTE — Assessment & Plan Note (Signed)
Summary: rov/sugical clearance/breast cancer/jml   Visit Type:  Follow-up Primary Provider:  Loyal Gambler DO  CC:  Surgical clearance- breast cancer.  History of Present Illness: This is a 60 year old white female patient who has recently been diagnosed with left-sided breast cancer and needs to undergo lumpectomy possible mastectomy and radiation. She has a pacemaker in her left chest and was told that she may need to have this removed. She is also here for cardiac clearance.  The patient has a history of of IV permanent pacemaker placed in September 2010. She also has a history of congestive heart failure secondary to diastolic dysfunction. Her heart failure has been under good control recently. She is walking up to a mile a day and had started losing weight. Now she is needing surgery and is very concerned about possible need for her pacemaker to be moved.  Patient denies chest pain palpitations dyspnea dyspnea on exertion dizziness or presyncope.  Current Medications (verified): 1)  Losartan Potassium 50 Mg Tabs (Losartan Potassium) .[name].. 1 Tab By Mouth Bid 2)  Lantus 100 Unit/ml Soln (Insulin Glargine) .... Inject 15 Units in The Morning 3)  Arthritis Pain Relief 650 Mg Cr-Tabs (Acetaminophen) .... Take Two Tablets By Mouth Once A Day 4)  Cymbalta 60 Mg Cpep (Duloxetine Hcl) .... Take One Tablet By Mouth Once Ad Ay 5)  Synthroid 50 Mcg Tabs (Levothyroxine Sodium) .[name].. 1 Tab By Mouth Daily 6)  Isosorbide Dinitrate 30 Mg Tabs (Isosorbide Dinitrate) .... Take One Tablet By Mouth Once Ad Ay 7)  Carvedilol 6.25 Mg Tabs (Carvedilol) .... Take One Tablet By Mouth Twice A Day 8)  Gabapentin 600 Mg Tabs (Gabapentin) .... Take 1 Tab By Mouth Three Times A Day 9)  Bumetanide 1 Mg Tabs (Bumetanide) .... Take 1 Tablet By Mouth Two Times A Day 10)  Multivitamins  Tabs (Multiple Vitamin) .... Take One Tablet By Mouth Once Ad Ay 11)  Mucinex 600 Mg Xr12h-Tab (Guaifenesin) .... Take One Tablet By Mouth  Once Ad Ay 12)  Calcium 600 1500 Mg Tabs (Calcium Carbonate) .... 2 Tablets Once A Day 13)  Advair Diskus 250-50 Mcg/dose Aepb (Fluticasone-Salmeterol) .... Use One Puff Twice A Day 14)  Bd Insulin Syringe 27.5g X 5/8" 2 Ml Misc (Insulin Syringe-Needle U-100) .... Use As Directed 15)  Freestyle Lite Test  Strp (Glucose Blood) .... Use Twice Daily As Directed 16)  Nexium 40 Mg Cpdr (Esomeprazole Magnesium) .... Take 1 Capsule By Mouth Once A Day 17)  Singulair 10 Mg Tabs (Montelukast Sodium) .... Take 1 Tablet By Mouth Once A Day 18)  Restasis 0.05 % Emul (Cyclosporine) .... One Drop Two Times A Day 19)  Tizanidine Hcl 4 Mg Tabs (Tizanidine Hcl) .... Take 1 Tablet By Mouth Two Times A Day 20)  Proair Hfa 108 (90 Base) Mcg/act Aers (Albuterol Sulfate) .... As Needed 21)  Accu-Chek Soft Touch Lancets  Misc (Lancets) .... Use Twice Daily As Directed 22)  Pravastatin Sodium 40 Mg Tabs (Pravastatin Sodium) .[name].. 1 Tab By Mouth Qhs 23)  Allegra 180 Mg Tabs (Fexofenadine Hcl) .[name].. 1 Tab By Mouth Daily 24)  Hyoscyamine Sulfate 0.125 Mg Tabs (Hyoscyamine Sulfate) .... As Needed 25)  Megared Capsule .... Take 1 Capsule By Mouth Once A Day 26)  Diltiazem Hcl Er Beads 120 Mg Xr24h-Cap (Diltiazem Hcl Er Beads) .... Take 1 Capsule By Mouth Two Times A Day  Allergies: 1)  ! Quinine 2)  ! Sulfa 3)  ! Pcn 4)  ! Rifadin (Rifampin)  5)  ! Clindamycin 6)  ! Accupril 7)  ! * Baking Soda 8)  ! Codeine  Past History:  Past Medical History: Last updated: 10/23/2009 Asthma H/O PUD ESSENTIAL HYPERTENSION, BENIGN (ICD-401.1) CHF (ICD-428.0) LBBB ALKALINE PHOSPHATASE, ELEVATED (ICD-790.5) DIABETIC PERIPHERAL NEUROPATHY (ICD-250.60) UNSPECIFIED HYPOTHYROIDISM (ICD-244.9) CLOSED FRACTURE OF UNSPECIFIED PART OF RADIUS (ICD-813.81) DEPRESSION (ICD-311) BACK PAIN, CHRONIC (ICD-724.5) ALLERGIC RHINITIS CAUSE UNSPECIFIED (ICD-477.9) DM (ICD-250.00) anxiety/ depression Lumbar DDD, chronic pain, Dr Sabra Heck at  [facility] Neurologic carpal tunnel IBS  Family History: Reviewed history from 03/07/2010 and no changes required. brother CHF sister ulcer, breast cancer at 26 mother HTN, died of cancer, melanoma, parkinsons dz father HTN, died in his 43s from CHF and pacemaker  Social History: Reviewed history from 10/23/2009 and no changes required. newly divorced. Finished grade school only. Brother lives w/ her.  2 kids in [place] - a son and a daughter. Quit smoking in 1969. Walks 2 days/ wk. Alcohol Use - no  Review of Systems       see history of present illness  Vital Signs:  Patient profile:   60 year old female Height:      62.1 inches Weight:      209 pounds BMI:     38.24 Pulse rate:   71 / minute Pulse rhythm:   regular Resp:     18 per minute BP sitting:   124 / 84  (left arm) Cuff size:   large  Vitals Entered By: Sidney Ace (April 02, 2010 12:07 PM)  Physical Exam  General:   Well-nournished, in no acute distress. Neck: No JVD, HJR, Bruit, or thyroid enlargement Lungs: No tachypnea, clear without wheezing, rales, or rhonchi Cardiovascular: RRR, PMI not displaced, heart sounds normal, no murmurs, gallops, bruit, thrill, or heave. Abdomen: BS normal. Soft without organomegaly, masses, lesions or tenderness. Extremities: without cyanosis, clubbing or edema. Good distal pulses bilateral SKin: Warm, no lesions or rashes  Musculoskeletal: No deformities Neuro: no focal signs    EKG  Procedure date:  04/02/2010  Findings:      AV paced without the magnet, ventricular paced with the magnet  PPM Specifications Following MD:  Cristopher Peru, MD     Referring MD:  Stanford Breed PPM Vendor:  [employer]     PPM Model Number:  0626     PPM Serial Number:  [id number] PPM DOI:  11/14/2001     PPM Implanting MD:  NOT IMPLANTED HERE  Lead 1    Location: RA     DOI: 11/14/2001     Model #: 3500     Serial #: [id number]     Status: active Lead 2    Location: RV     DOI: 11/14/2001      Model #: 7169     Serial #: [id number]     Status: active Lead 3    Location: LV     DOI: 11/14/2001     Model #: 7510     Serial #: [id number]     Status: active   Indications:  CHF   PPM Follow Up Pacer Dependent:  Yes     Configuration: LV TIP TO RV RING  Episodes Coumadin:  No  Parameters Mode:  DDD     Lower Rate Limit:  70     Upper Rate Limit:  140 Paced AV Delay:  140     Sensed AV Delay:  130  Impression & Recommendations:  Problem # 1:  [place] (ICD-V72.81) Patient  is stable from a cardiac standpoint as far as undergoing lumpectomy or mastectomy. She will need to see Dr. Crissie Sickles back because of possible need for movement of her pacemaker. She is seeing her radiation oncologist next week and  has followup with her surgeon August 23rd. She does not have the names of her physician she is seeing and she will call us back with the names.I discussed this case with Dr. Stanford Breed who recommended she see Dr. Lovena Le prior to undergoing surgery. Her updated medication list for this problem includes:.    Isosorbide Dinitrate 30 Mg Tabs (Isosorbide dinitrate) .[name]... Take one tablet by mouth once ad ay    Carvedilol 6.25 Mg Tabs (Carvedilol) .[name]... Take one tablet by mouth twice a day    Diltiazem Hcl Er Beads 120 Mg Xr24h-cap (Diltiazem hcl er beads) .[name]... Take 1 capsule by mouth two times a day  Problem # 2:  INVASIVE DUCTAL CARCINOMA, LEFT BREAST (ICD-174.9) Needs surgery  Problem # 3:  PACEMAKER, PERMANENT (ICD-V45.01) pacemaker check his able today. She will need a recheck prior to undergoing radiation.  Problem # 4:  CHF (ICD-428.0) Patient had a history congestive heart failure. She does have a normal LV function with mild LVH ejection fraction 55-60% with no wall motion abnormality on 2-D echo December 19, 2009. Her heart failure is compensated. Her updated medication list for this problem includes:    Losartan Potassium 50 Mg Tabs (Losartan potassium) .[name]...  1 tab by mouth bid    Isosorbide Dinitrate 30 Mg Tabs (Isosorbide dinitrate) .[name]... Take one tablet by mouth once ad ay    Carvedilol 6.25 Mg Tabs (Carvedilol) .[name]... Take one tablet by mouth twice a day    Bumetanide 1 Mg Tabs (Bumetanide) .[name]... Take 1 tablet by mouth two times a day    Diltiazem Hcl Er Beads 120 Mg Xr24h-cap (Diltiazem hcl er beads) .[name]... Take 1 capsule by mouth two times a day  Orders: EKG w/ Interpretation (93000)  Patient Instructions: 1)  Your physician recommends that you schedule a follow-up appointment in: August 22nd  at 4:15 PM, with Dr. Lovena Le

## 2010-09-23 NOTE — Assessment & Plan Note (Signed)
Summary: ROV/JML   Visit Type:  Follow-up Primary Provider:  Loyal Gambler DO   History of Present Illness: Tanya Hogan returns today for followup.  She is a pleasant 60 yo woman with a h/o CHB, CHF, s/p BiV PM, who was recently diagnosed with breast CA.  She has undergone XRT to the area near her PPM.  She has completed her treatment.  Prior to the treatment, we considered moving her device so that it would not interfer with her treatment or damage the device.  Neither occured. She denies c/p, sob, or peripheral edema. No skiin changes around the PPM incision.  Current Medications (verified): 1)  Losartan Potassium 50 Mg Tabs (Losartan Potassium) .[name].. 1 Tab By Mouth Bid 2)  Lantus 100 Unit/ml Soln (Insulin Glargine) .... Inject 15 Units in The Morning 3)  Arthritis Pain Relief 650 Mg Cr-Tabs (Acetaminophen) .... Take Two Tablets By Mouth Once A Day 4)  Cymbalta 60 Mg Cpep (Duloxetine Hcl) .... Take One Tablet By Mouth Once Ad Ay 5)  Synthroid 50 Mcg Tabs (Levothyroxine Sodium) .[name].. 1 Tab By Mouth Daily 6)  Isosorbide Mononitrate Cr 30 Mg Xr24h-Tab (Isosorbide Mononitrate) .[name].. 1  Tab Once Daily 7)  Carvedilol 6.25 Mg Tabs (Carvedilol) .... Take One Tablet By Mouth Twice A Day 8)  Gabapentin 600 Mg Tabs (Gabapentin) .... Take 2 Tab By Mouth Three Times A Day 9)  Bumetanide 1 Mg Tabs (Bumetanide) .... Take 1 Tablet By Mouth Two Times A Day 10)  Multivitamins  Tabs (Multiple Vitamin) .... Take One Tablet By Mouth Once Ad Ay 11)  Mucinex 600 Mg Xr12h-Tab (Guaifenesin) .... Take One Tablet By Mouth Once Ad Ay 12)  Calcium 600 1500 Mg Tabs (Calcium Carbonate) .... 2 Tablets Once A Day 13)  Advair Diskus 250-50 Mcg/dose Aepb (Fluticasone-Salmeterol) .... Use One Puff Twice A Day 14)  Bd Insulin Syringe 27.5g X 5/8" 2 Ml Misc (Insulin Syringe-Needle U-100) .... Use As Directed 15)  Freestyle Lite Test  Strp (Glucose Blood) .... Use Twice Daily As Directed 16)  Nexium 40 Mg Cpdr (Esomeprazole  Magnesium) .... Take 1 Capsule By Mouth Once A Day 17)  Singulair 10 Mg Tabs (Montelukast Sodium) .... Take 1 Tablet By Mouth Once A Day 18)  Restasis 0.05 % Emul (Cyclosporine) .... One Drop Two Times A Day 19)  Tizanidine Hcl 4 Mg Tabs (Tizanidine Hcl) .... Take 2 Tablet By Mouth Two Times A Day 20)  Proair Hfa 108 (90 Base) Mcg/act Aers (Albuterol Sulfate) .... As Needed 21)  Accu-Chek Soft Touch Lancets  Misc (Lancets) .... Use Twice Daily As Directed 22)  Pravastatin Sodium 40 Mg Tabs (Pravastatin Sodium) .[name].. 1 Tab By Mouth Qhs 23)  Allegra 180 Mg Tabs (Fexofenadine Hcl) .[name].. 1 Tab By Mouth Daily 24)  Hyoscyamine Sulfate 0.125 Mg Tabs (Hyoscyamine Sulfate) .... As Needed 25)  Megared Capsule .... Take 1 Capsule By Mouth Once A Day 26)  Diltiazem Hcl Er Beads 120 Mg Xr24h-Cap (Diltiazem Hcl Er Beads) .... Take 1 Capsule By Mouth Two Times A Day 27)  Bd Pen Needle Ultrafine 29g X 12.36m Misc (Insulin Pen Needle) .... Use Daily As Directed  Allergies (verified): 1)  ! Quinine 2)  ! Sulfa 3)  ! Pcn 4)  ! Rifadin (Rifampin) 5)  ! Clindamycin 6)  ! Accupril 7)  ! * Baking Soda 8)  ! Codeine  Past History:  Past Medical History: Last updated: 05/08/2010 Asthma H/O PUD ESSENTIAL HYPERTENSION, BENIGN (ICD-401.1) CHF (ICD-428.0) LBBB  ALKALINE PHOSPHATASE, ELEVATED (ICD-790.5) DIABETIC PERIPHERAL NEUROPATHY (ICD-250.60) UNSPECIFIED HYPOTHYROIDISM (ICD-244.9) CLOSED FRACTURE OF UNSPECIFIED PART OF RADIUS (ICD-813.81) DEPRESSION (ICD-311) BACK PAIN, CHRONIC (ICD-724.5) ALLERGIC RHINITIS CAUSE UNSPECIFIED (ICD-477.9) DM (ICD-250.00) anxiety/ depression Lumbar DDD, chronic pain, [facility] Neurologic carpal tunnel IBS L breast cancer 03-2010  Past Surgical History: Last updated: 05/08/2010 pacemaker 2009 [facility] knee and foot surgery hysterectomy at 28 for bleeding Tubal ligation pen reduction and internal fixation, right distal  radius fracture, 2  pieces, with carpal tunnel release.  Tanya Hogan 04-2010, Tanya Hogan for breast cancer  Review of Systems  The patient denies chest pain, syncope, dyspnea on exertion, and peripheral edema.    Vital Signs:  Patient profile:   60 year old female Height:      62.1 inches Weight:      210 pounds BMI:     38.42 Pulse rate:   76 / minute BP sitting:   100 / 60  (left arm)  Vitals Entered By: Margaretmary Bayley CMA (May 21, 2010 9:01 AM)  Physical Exam  General:  alert, well-developed, well-nourished, and well-hydrated.  obese, in NAD Head:  normocephalic and atraumatic.   Eyes:  pupils equal, pupils round, and pupils reactive to light.   Mouth:  pharynx pink and moist and fair dentition.   Neck:  no masses.   Chest Wall:  pacer in the L upper chest wall, well healed. Lungs:  Normal respiratory effort, chest expands symmetrically. Lungs are clear to auscultation, no crackles or wheezes. Heart:  normal rate, regular rhythm, and no murmur.   Abdomen:  Bowel sounds positive,abdomen soft and non-tender without masses, organomegaly or hernias noted. Msk:  no joint tenderness, no joint swelling, no joint warmth, and no redness over joints.   Pulses:  2+ radial and pedal pulses Extremities:  no LE edema Neurologic:  gait normal.     PPM Specifications Following MD:  Tanya Peru, MD     Referring MD:  Tanya Hogan Vendor:  Medtronic     PPM Model Number:  [id number]     PPM Serial Number:  [id number] PPM DOI:  11/14/2001     PPM Implanting MD:  NOT IMPLANTED HERE  Lead 1    Location: RA     DOI: 11/14/2001     Model #: 9038     Serial #: [id number]     Status: active Lead 2    Location: RV     DOI: 11/14/2001     Model #: 1660     Serial #: [id number]     Status: active Lead 3    Location: LV     DOI: 11/14/2001     Model #: [id number]     Serial #: [id number]     Status: active  Magnet Response Rate:  BOL 85 ERI 65  Indications:  CHF   PPM Follow Up Remote Check?  Yes Battery Voltage:  2.984  V     Battery Est. Longevity:  4 years     Pacer Dependent:  Yes       PPM Device Measurements Atrium  Amplitude: 2.8 mV, Impedance: 596 ohms, Threshold: 1.0 V at .5 msec Right Ventricle  Amplitude: 0 mV, Impedance: 682 ohms, Threshold: 1.0 V at .6 msec Left Ventricle  Impedance: 4356861 ohms, Threshold: 1.0 V at .8 msec Configuration: LV TIP TO RV RING  Episodes MS Episodes:  1     Percent Mode Switch:  0     Coumadin:  No  Ventricular High Rate:  0     Atrial Pacing:  14%     Ventricular Pacing:  100%  Parameters Mode:  DDD     Lower Rate Limit:  70     Upper Rate Limit:  140 Paced AV Delay:  140     Sensed AV Delay:  130 Tech Comments:  increased RV PW to .6 per threshold no other changes made  MD Comments:  Agree with above.  Impression & Recommendations:  Problem # 1:  PACEMAKER, PERMANENT (ICD-V45.01) Her device is working normally.  Will followup in several months.  Problem # 2:  CHF (ICD-428.0) Her symptoms are well controlled.  Continue meds as below.  A low sodium diet is recommended. Her updated medication list for this problem includes:    Losartan Potassium 50 Mg Tabs (Losartan potassium) .[name]... 1 tab by mouth bid    Isosorbide Mononitrate Cr 30 Mg Xr24h-tab (Isosorbide mononitrate) .[name]... 1  tab once daily    Carvedilol 6.25 Mg Tabs (Carvedilol) .[name]... Take one tablet by mouth twice a day    Bumetanide 1 Mg Tabs (Bumetanide) .[name]... Take 1 tablet by mouth two times a day    Diltiazem Hcl Er Beads 120 Mg Xr24h-cap (Diltiazem hcl er beads) .[name]... Take 1 capsule by mouth two times a day  Problem # 3:  ESSENTIAL HYPERTENSION, BENIGN (ICD-401.1) Her  blood pressure is well controlled.  Continue current meds. Her updated medication list for this problem includes:    Losartan Potassium 50 Mg Tabs (Losartan potassium) .[name]... 1 tab by mouth bid    Carvedilol 6.25 Mg Tabs (Carvedilol) .[name]... Take one tablet by mouth twice a day    Bumetanide 1 Mg Tabs (Bumetanide) .[name]... Take 1 tablet  by mouth two times a day    Diltiazem Hcl Er Beads 120 Mg Xr24h-cap (Diltiazem hcl er beads) .[name]... Take 1 capsule by mouth two times a day  Patient Instructions: 1)  Your physician recommends that you schedule a follow-up appointment in: with Tanya. Stanford Breed in [place]  in 6 months and Tanya. Lovena Le in 1 year. 2)  Your physician recommends that you continue on your current medications as directed. Please refer to the Current Medication list given to you today. Prescriptions: DILTIAZEM HCL ER BEADS 120 MG XR24H-CAP (DILTIAZEM HCL ER BEADS) Take 1 capsule by mouth two times a day  #180 x 3   Entered by:   Margaretmary Bayley CMA   Authorized by:   Sophronia Simas, MD, [facility]   Signed by:   Margaretmary Bayley CMA on 05/21/2010   Method used:   Faxed to ...       [facility] (mail-order)       [address], [place]  [id number]       Ph: [phone]       Fax: [phone]   RxID:   [id number]

## 2010-09-23 NOTE — Letter (Signed)
Summary: Primary Care Consult Scheduled Letter  [facility] [address] [place], [place] [address]   Phone: [phone]  Fax: [phone]      10/04/2009 MRN: [id number]  [address], [place]  [address]    Dear Tanya Hogan,      We have scheduled an appointment for you.  At the recommendation of Dr.BOWEN , we have scheduled you a consult with [facility], [place]  , DR Stanford Breed  on MARCH 2,2011 at 3:45PM .  Their address is_ [address], [address] ,[name] . The office phone number is [phone].  If this appointment day and time is not convenient for you, please feel free to call the office of the doctor you are being referred to at the number listed above and reschedule the appointment.     It is important for you to keep your scheduled appointments. We are here to make sure you are given good patient care. If you have questions or you have made changes to your appointment, please notify us at  [phone], ask for HELEN.    Thank you,  Patient Care Coordinator [place] at [facility]

## 2010-09-23 NOTE — Assessment & Plan Note (Signed)
Summary: ? yeast infection   Vital Signs:  Patient profile:   60 year old female Height:      62.1 inches Weight:      210 pounds Pulse rate:   108 / minute BP sitting:   120 / 69  (left arm) Cuff size:   large  Vitals Entered By: Geanie Kenning LPN (July 14, 1442 9:19 AM) CC: has bladder mesh and keeps a yeast infection- has brown discharge for sometime now   Primary Care Provider:  Loyal Gambler DO  CC:  has bladder mesh and keeps a yeast infection- has brown discharge for sometime now.  History of Present Illness: 60 yo WF presents for f/u visit.  She is seeing heme/onc in [facility] for breast cancer.  She is on Tamoxifen which is making her have pains all over.  She has had mesh in place after she had a sling procedure 5 yrs ago.  She has vaginal dryness.  She is using OTC yeast cream for a discharge.  She was on diflucan while getting radiation for breast cancer.  She is now done with radiation treatment.    Current Medications (verified): 1)  Losartan Potassium 50 Mg Tabs (Losartan Potassium) .[name].. 1 Tab By Mouth Bid 2)  Lantus Solostar 100 Unit/ml Soln (Insulin Glargine) .[name].. 15 Units [place] Injection Qam 3)  Arthritis Pain Relief 650 Mg Cr-Tabs (Acetaminophen) .... Take Two Tablets By Mouth Once A Day 4)  Cymbalta 60 Mg Cpep (Duloxetine Hcl) .... Take One Tablet By Mouth Once Ad Ay 5)  Synthroid 50 Mcg Tabs (Levothyroxine Sodium) .[name].. 1 Tab By Mouth Daily 6)  Isosorbide Mononitrate Cr 30 Mg Xr24h-Tab (Isosorbide Mononitrate) .[name].. 1  Tab Once Daily 7)  Carvedilol 6.25 Mg Tabs (Carvedilol) .... Take One Tablet By Mouth Twice A Day 8)  Gabapentin 600 Mg Tabs (Gabapentin) .... Take 2 Tab By Mouth Three Times A Day 9)  Bumetanide 1 Mg Tabs (Bumetanide) .... Take 1 Tablet By Mouth Two Times A Day 10)  Multivitamins  Tabs (Multiple Vitamin) .... Take One Tablet By Mouth Once Ad Ay 11)  Calcium 600 1500 Mg Tabs (Calcium Carbonate) .... 2 Tablets Once A Day 12)  Advair Diskus 250-50  Mcg/dose Aepb (Fluticasone-Salmeterol) .... Use One Puff Twice A Day 13)  Bd Insulin Syringe 27.5g X 5/8" 2 Ml Misc (Insulin Syringe-Needle U-100) .... Use As Directed 14)  Freestyle Lite Test  Strp (Glucose Blood) .... Use Twice Daily As Directed 15)  Nexium 40 Mg Cpdr (Esomeprazole Magnesium) .... Take 1 Capsule By Mouth Once A Day 16)  Singulair 10 Mg Tabs (Montelukast Sodium) .... Take 1 Tablet By Mouth Once A Day 17)  Restasis 0.05 % Emul (Cyclosporine) .... One Drop Two Times A Day 18)  Tizanidine Hcl 4 Mg Tabs (Tizanidine Hcl) .... Take 2 Tablet By Mouth Two Times A Day 19)  Proair Hfa 108 (90 Base) Mcg/act Aers (Albuterol Sulfate) .... As Needed 20)  Accu-Chek Soft Touch Lancets  Misc (Lancets) .... Use Twice Daily As Directed 21)  Pravastatin Sodium 40 Mg Tabs (Pravastatin Sodium) .[name].. 1 Tab By Mouth Qhs 22)  Allegra 180 Mg Tabs (Fexofenadine Hcl) .[name].. 1 Tab By Mouth Daily 23)  Hyoscyamine Sulfate 0.125 Mg Tabs (Hyoscyamine Sulfate) .... As Needed 24)  Megared Capsule .... Take 1 Capsule By Mouth Once A Day 25)  Diltiazem Hcl Er Beads 120 Mg Xr24h-Cap (Diltiazem Hcl Er Beads) .... Take 1 Capsule By Mouth Two Times A Day 26)  Bd  Pen Needle Ultrafine 29g X 12.33m Misc (Insulin Pen Needle) .... Use Daily As Directed 27)  Orphenadrine Citrate Cr 100 Mg Xr12h-Tab (Orphenadrine Citrate) .... Take One Tablet By Mouth Twice A Day 28)  Tamoxifen Citrate 20 Mg Tabs (Tamoxifen Citrate) .... Take One Tablet By Mouth Once A Day  Allergies (verified): 1)  ! Quinine 2)  ! Sulfa 3)  ! Pcn 4)  ! Rifadin (Rifampin) 5)  ! Clindamycin 6)  ! Accupril 7)  ! * Baking Soda 8)  ! Codeine  Comments:  Nurse/Medical Assistant: The patient's medications and allergies were reviewed with the patient and were updated in the Medication and Allergy Lists. KGeanie KenningLPN (November 21, 210279:21 AM)  Past History:  Past Medical History: Reviewed history from 05/08/2010 and no changes  required. Asthma H/O PUD ESSENTIAL HYPERTENSION, BENIGN (ICD-401.1) CHF (ICD-428.0) LBBB ALKALINE PHOSPHATASE, ELEVATED (ICD-790.5) DIABETIC PERIPHERAL NEUROPATHY (ICD-250.60) UNSPECIFIED HYPOTHYROIDISM (ICD-244.9) CLOSED FRACTURE OF UNSPECIFIED PART OF RADIUS (ICD-813.81) DEPRESSION (ICD-311) BACK PAIN, CHRONIC (ICD-724.5) ALLERGIC RHINITIS CAUSE UNSPECIFIED (ICD-477.9) DM (ICD-250.00) anxiety/ depression Lumbar DDD, chronic [facility] L [facility]2010  Past Surgical History: Reviewed history from 05/08/2010 and no changes required. pacemaker 2009 [facility] knee and foot surgery hysterectomy at 89for bleeding Tubal ligation pen reduction and internal fixation, right distal  radius fracture, 2 pieces, with carpal tunnel release.  lumpectomy 04-2010, Dr IDalbert Batmanfor breast cancer  Family History: Reviewed history from 03/07/2010 and no changes required. brother CHF sister ulcer, breast cancer at 668mother HTN, died of cancer, melanoma, parkinsons dz father HTN, died in his 759sfrom CHF and pacemaker  Social History: Reviewed history from 10/23/2009 and no changes required. newly divorced. Finished grade school only. Brother lives w/ her.  2 kids in [place] - a son and a daughter. Quit smoking in 1969. Walks 2 days/ wk. Alcohol Use - no  Review of Systems      See HPI  Physical Exam  General:  alert, well-developed, well-nourished, well-hydrated, and overweight-appearing.   Lungs:  Normal respiratory effort, chest expands symmetrically. Lungs are clear to auscultation, no crackles or wheezes. Heart:  normal rate, regular rhythm, and no murmur.   Genitalia:  atrophic vaginitis with fishy odor.  no discharge on speculum exam.  No bleeding.  Normal vaginal mucosa.  No appreciable prolapse on bimanual exam.     Impression & Recommendations:  Problem # 1:  VAGINAL DISCHARGE (ICD-623.5) WET prep sent off today.   Will f/u results tomorrow.  Has vaginal atrophy but is not a candiate for hormones since she is currently on treatment for breast cancer. Orders: T-Wet Prep (([id number]  Problem # 2:  INVASIVE DUCTAL CARCINOMA, LEFT BREAST (ICD-174.9) Seeing Dr HRudene Rein [place]  She just completed radiation and had surgery done.  She is on Tamoxifen which is causing joint pain. Advised trial of tylenol as needed and to let her oncologist know at her f/u appt next wk.  Complete Medication List: 1)  Losartan Potassium 50 Mg Tabs (Losartan potassium) ..[name]. 1 tab by mouth bid 2)  Lantus Solostar 100 Unit/ml Soln (Insulin glargine) ..[name]. 15 units [place] injection qam 3)  Arthritis Pain Relief 650 Mg Cr-tabs (Acetaminophen) .... Take two tablets by mouth once a day 4)  Cymbalta 60 Mg Cpep (Duloxetine hcl) .... Take one tablet by mouth once ad ay 5)  Synthroid 50 Mcg Tabs (Levothyroxine sodium) ..[name]. 1 tab by mouth daily 6)  Isosorbide Mononitrate  Cr 30 Mg Xr24h-tab (Isosorbide mononitrate) .[name].. 1  tab once daily 7)  Carvedilol 6.25 Mg Tabs (Carvedilol) .... Take one tablet by mouth twice a day 8)  Gabapentin 600 Mg Tabs (Gabapentin) .... Take 2 tab by mouth three times a day 9)  Bumetanide 1 Mg Tabs (Bumetanide) .... Take 1 tablet by mouth two times a day 10)  Multivitamins Tabs (Multiple vitamin) .... Take one tablet by mouth once ad ay 11)  Calcium 600 1500 Mg Tabs (Calcium carbonate) .... 2 tablets once a day 12)  Advair Diskus 250-50 Mcg/dose Aepb (Fluticasone-salmeterol) .... Use one puff twice a day 13)  Bd Insulin Syringe 27.5g X 5/8" 2 Ml Misc (Insulin syringe-needle u-100) .... Use as directed 14)  Freestyle Lite Test Strp (Glucose blood) .... Use twice daily as directed 15)  Nexium 40 Mg Cpdr (Esomeprazole magnesium) .... Take 1 capsule by mouth once a day 16)  Singulair 10 Mg Tabs (Montelukast sodium) .... Take 1 tablet by mouth once a day 17)  Restasis 0.05 % Emul (Cyclosporine) .... One drop two times a  day 18)  Tizanidine Hcl 4 Mg Tabs (Tizanidine hcl) .... Take 2 tablet by mouth two times a day 19)  Proair Hfa 108 (90 Base) Mcg/act Aers (Albuterol sulfate) .... As needed 20)  Accu-chek Soft Touch Lancets Misc (Lancets) .... Use twice daily as directed 21)  Pravastatin Sodium 40 Mg Tabs (Pravastatin sodium) .[name].. 1 tab by mouth qhs 22)  Allegra 180 Mg Tabs (Fexofenadine hcl) .[name].. 1 tab by mouth daily 23)  Hyoscyamine Sulfate 0.125 Mg Tabs (Hyoscyamine sulfate) .... As needed 24)  Megared Capsule  .... Take 1 capsule by mouth once a day 25)  Diltiazem Hcl Er Beads 120 Mg Xr24h-cap (Diltiazem hcl er beads) .... Take 1 capsule by mouth two times a day 26)  Bd Pen Needle Ultrafine 29g X 12.8m Misc (Insulin pen needle) .... Use daily as directed 27)  Orphenadrine Citrate Cr 100 Mg Xr12h-tab (Orphenadrine citrate) .... Take one tablet by mouth twice a day 28)  Tamoxifen Citrate 20 Mg Tabs (Tamoxifen citrate) .... Take one tablet by mouth once a day  Patient Instructions: 1)  You are not due for any fasting labs today. 2)  Synthroid (generic) filled. 3)  Will call you with wet prep results tomorrow. 4)  Set up Diabetes follow up in 2-3 mos Prescriptions: SYNTHROID 50 MCG TABS (LEVOTHYROXINE SODIUM) 1 tab by mouth daily  #90 x 1   Entered and Authorized by:   KLoyal GamblerDO   Signed by:   KLoyal GamblerDO on 07/14/2010   Method used:   Print then Give to Patient   RxID:   [id number]   Orders Added: 1)  T-Wet Prep [[[id number]]2)  Est. Patient Level III [[60677]

## 2010-09-23 NOTE — Letter (Signed)
Summary: [facility]   Imported By: Phillis Knack 06/24/2010 10:54:25  _____________________________________________________________________  External Attachment:    Type:   Image     Comment:   External Document

## 2010-09-23 NOTE — Miscellaneous (Signed)
Summary: device preload  Clinical Lists Changes  Observations: Added new observation of PPM INDICATN: CHF (10/25/2009 7:58) Added new observation of PPMLEADSTAT3: active (10/25/2009 7:58) Added new observation of PPMLEADSER3: [id number] V (10/25/2009 7:58) Added new observation of PPMLEADMOD3: 4193  (10/25/2009 7:58) Added new observation of PPMLEADDOI3: 11/14/2001  (10/25/2009 7:58) Added new observation of PPMLEADLOC3: LV  (10/25/2009 7:58) Added new observation of PPMLEADSTAT2: active  (10/25/2009 7:58) Added new observation of PPMLEADSER2: [id number] V  (10/25/2009 7:58) Added new observation of PPMLEADMOD2: 4068  (10/25/2009 7:58) Added new observation of PPMLEADDOI2: 11/14/2001  (10/25/2009 7:58) Added new observation of PPMLEADLOC2: RV  (10/25/2009 7:58) Added new observation of PPMLEADSTAT1: active  (10/25/2009 7:58) Added new observation of PPMLEADSER1: [id number] V  (10/25/2009 7:58) Added new observation of PPMLEADMOD1: 4068  (10/25/2009 7:58) Added new observation of PPMLEADDOI1: 11/14/2001  (10/25/2009 7:58) Added new observation of PPMLEADLOC1: RA  (10/25/2009 7:58) Added new observation of PPM IMP MD: NOT IMPLANTED HERE  (10/25/2009 7:58) Added new observation of PPM DOI: 11/14/2001  (10/25/2009 7:58) Added new observation of PPM SERL#: [id number]  (10/25/2009 7:58) Added new observation of PPM MODL#: [id number]  (10/25/2009 7:58) Added new observation of PACEMAKERMFG: [employer]  (10/25/2009 7:58) Added new observation of PPM REFER MD: CRENSHAW  (10/25/2009 7:58) Added new observation of PACEMAKER MD: Cristopher Peru, MD  (10/25/2009 7:58)      PPM Specifications Following MD:  Cristopher Peru, MD     Referring MD:  Conley Simmonds Vendor:  [employer]     PPM Model Number:  6767     PPM Serial Number:  [id number] PPM DOI:  11/14/2001     PPM Implanting MD:  NOT IMPLANTED HERE  Lead 1    Location: RA     DOI: 11/14/2001     Model #: 8366     Serial #: [id number]     Status: active Lead  2    Location: RV     DOI: 11/14/2001     Model #: 0354     Serial #: [id number]     Status: active Lead 3    Location: LV     DOI: 11/14/2001     Model #: 7001     Serial #: [id number]     Status: active   Indications:  CHF

## 2010-09-23 NOTE — Letter (Signed)
Summary: [facility]  [facility]   Imported By: Edmonia James 12/27/2009 13:00:45  _____________________________________________________________________  External Attachment:    Type:   Image     Comment:   External Document

## 2010-09-23 NOTE — Progress Notes (Signed)
Summary: MEDICINE REQUEST  Phone Note Refill Request Message from:  Patient on February 25, 2010 2:57 PM  PLEASE CALL IN SYNTHROID 50MG... i GAVE HER A SAMPLE PACK TODAY WHEN SHE WALKED IN... CALL INTO [facility]  Initial call taken by: Otho Ket,  February 25, 2010 2:57 PM    New/Updated Medications: SYNTHROID 50 MCG TABS (LEVOTHYROXINE SODIUM) 1 tab by mouth daily [BMN] Prescriptions: SYNTHROID 50 MCG TABS (LEVOTHYROXINE SODIUM) 1 tab by mouth daily Brand medically necessary #30 x 1   Entered and Authorized by:   Loyal Gambler DO   Signed by:   Loyal Gambler DO on 02/25/2010   Method used:   Electronically to        [facility] # [phone]* (retail)       [address]       [place], [place]  [address]       Ph: [phone]       Fax: [phone]   RxID:   [id number]

## 2010-09-23 NOTE — Cardiovascular Report (Signed)
Summary: [facility] Pacemaker/ICD Form   Imported By: Sallee Provencal 05/01/2010 08:49:38  _____________________________________________________________________  External Attachment:    Type:   Image     Comment:   External Document

## 2010-09-23 NOTE — Letter (Signed)
Summary: [facility]   Imported By: Phillis Knack 06/24/2010 10:53:07  _____________________________________________________________________  External Attachment:    Type:   Image     Comment:   External Document

## 2010-09-23 NOTE — Assessment & Plan Note (Signed)
Summary: CPE   Vital Signs:  Patient profile:   60 year old female Height:      62.1 inches Weight:      204 pounds BMI:     37.33 O2 Sat:      95 % on Room air Pulse rate:   77 / minute BP sitting:   141 / 80  (left arm) Cuff size:   large  Vitals Entered By: Sherlean Foot CMA (March 07, 2010 10:35 AM)  O2 Flow:  Room air CC: CPE    Primary Care Provider:  Loyal Gambler DO  CC:  CPE .  History of Present Illness: 60 yo WF presents for CPE.  She is due for her mammogram.  She had a DEXA by ortho in March of this year showing osteopenia.  She is taking a MVI and Caltrate D twice a day.  She recently got divorced and is happy about this.  She is living w/ her brother.  She is not sexually active.  She is doing well on current meds.  Seeing [place] [facility] for her pacemaker and CHF.  Asymptomatic and due for f/u with Dr Stanford Breed in Sept.  Had echo done in the past year.  Denies any CP or DOE or leg swelling.  She has failed to lose any wt and has a fairly poor diet and no exercise.  Had a TAH for noncanceroius reasons and is not on HRT so does not need pap smears.  Had a colonoscopy in 2010 that showed polyps, due for repeat in 2015.  Her fasting labs and A1C are UTD.    Her Tdap is due.  Current Medications (verified): 1)  Losartan Potassium 50 Mg Tabs (Losartan Potassium) .[name].. 1 Tab By Mouth Bid 2)  Lantus 100 Unit/ml Soln (Insulin Glargine) .... Inject 15 Units in The Morning 3)  B-100 Complex  Tabs (Vitamins-Lipotropics) .... Take One Tablet By Mouth Once A Day 4)  Glucosamine-Chondroitin 250-200 Mg Caps (Glucosamine-Chondroitin) .... Take One Tablet By Mouth Twice Ad Ay 5)  Arthritis Pain Relief 650 Mg Cr-Tabs (Acetaminophen) .... Take Two Tablets By Mouth Once A Day 6)  Cymbalta 60 Mg Cpep (Duloxetine Hcl) .... Take One Tablet By Mouth Once Ad Ay 7)  Synthroid 50 Mcg Tabs (Levothyroxine Sodium) .[name].. 1 Tab By Mouth Daily 8)  Isosorbide Dinitrate 30 Mg Tabs (Isosorbide Dinitrate)  .... Take One Tablet By Mouth Once Ad Ay 9)  Carvedilol 6.25 Mg Tabs (Carvedilol) .... Take One Tablet By Mouth Twice A Day 10)  Gabapentin 600 Mg Tabs (Gabapentin) .... Take 1 Tab By Mouth Three Times A Day 11)  Bumetanide 1 Mg Tabs (Bumetanide) .... Take 1 Tablet By Mouth Two Times A Day 12)  Multivitamins  Tabs (Multiple Vitamin) .... Take One Tablet By Mouth Once Ad Ay 13)  Mucinex 600 Mg Xr12h-Tab (Guaifenesin) .... Take One Tablet By Mouth Once Ad Ay 14)  Calcium 600 1500 Mg Tabs (Calcium Carbonate) .... 2 Tablets Once A Day 15)  Advair Diskus 250-50 Mcg/dose Aepb (Fluticasone-Salmeterol) .... Use One Puff Twice A Day 16)  Bd Insulin Syringe 27.5g X 5/8" 2 Ml Misc (Insulin Syringe-Needle U-100) .... Use As Directed 17)  Freestyle Lite Test  Strp (Glucose Blood) .... Use Twice Daily As Directed 18)  Nexium 40 Mg Cpdr (Esomeprazole Magnesium) .... Take 1 Capsule By Mouth Once A Day 19)  Singulair 10 Mg Tabs (Montelukast Sodium) .... Take 1 Tablet By Mouth Once A Day 20)  Restasis 0.05 %  Emul (Cyclosporine) .... One Drop Two Times A Day 21)  Tizanidine Hcl 4 Mg Tabs (Tizanidine Hcl) .... 2 Tablets Two Times A Day 22)  Proair Hfa 108 (90 Base) Mcg/act Aers (Albuterol Sulfate) .... As Needed 23)  Accu-Chek Soft Touch Lancets  Misc (Lancets) .... Use Twice Daily As Directed 24)  Pravastatin Sodium 40 Mg Tabs (Pravastatin Sodium) .[name].. 1 Tab By Mouth Qhs 25)  Fexofenadine Hcl 180 Mg Tabs (Fexofenadine Hcl) .... Take One Tablet By Mouth Once Daily. 26)  Glucosamine-Chondroitin   Caps (Glucosamine-Chondroit-Vit C-Mn) .... Take One Tablet By Mouth Twice Daily.  Allergies (verified): 1)  ! Quinine 2)  ! Sulfa 3)  ! Pcn 4)  ! Rifadin (Rifampin) 5)  ! Clindamycin 6)  ! Accupril 7)  ! * Baking Soda  Past History:  Past Medical History: Reviewed history from 10/23/2009 and no changes required. Asthma H/O PUD ESSENTIAL HYPERTENSION, BENIGN (ICD-401.1) CHF (ICD-428.0) LBBB ALKALINE  PHOSPHATASE, ELEVATED (ICD-790.5) DIABETIC PERIPHERAL NEUROPATHY (ICD-250.60) UNSPECIFIED HYPOTHYROIDISM (ICD-244.9) CLOSED FRACTURE OF UNSPECIFIED PART OF RADIUS (ICD-813.81) DEPRESSION (ICD-311) BACK PAIN, CHRONIC (ICD-724.5) ALLERGIC RHINITIS CAUSE UNSPECIFIED (ICD-477.9) DM (ICD-250.00) anxiety/ depression Lumbar DDD, chronic pain, Dr Sabra Heck at [facility] Neurologic carpal tunnel IBS  Past Surgical History: Reviewed history from 10/23/2009 and no changes required. pacemaker 2009 [facility] knee and foot surgery hysterectomy at 41 for bleeding Tubal ligation pen reduction and internal fixation, right distal  radius fracture, 2 pieces, with carpal tunnel release.   Family History: Reviewed history from 10/23/2009 and no changes required. brother CHF sister ulcer, breast cancer at 74 mother HTN, died of cancer, melanoma, parkinsons dz father HTN, died in his 11s from CHF and pacemaker  Social History: Reviewed history from 10/23/2009 and no changes required. newly divorced. Finished grade school only. Brother lives w/ her.  2 kids in [place] - a son and a daughter. Quit smoking in 1969. Walks 2 days/ wk. Alcohol Use - no  Review of Systems  The patient denies anorexia, fever, weight loss, weight gain, vision loss, decreased hearing, hoarseness, chest pain, syncope, dyspnea on exertion, peripheral edema, prolonged cough, headaches, hemoptysis, abdominal pain, melena, hematochezia, severe indigestion/heartburn, hematuria, incontinence, genital sores, muscle weakness, suspicious skin lesions, transient blindness, difficulty walking, depression, unusual weight change, abnormal bleeding, enlarged lymph nodes, angioedema, breast masses, and testicular masses.    Physical Exam  General:  alert, well-developed, well-nourished, and well-hydrated.  obese Head:  normocephalic and atraumatic.   Eyes:  pupils equal, pupils round, and pupils reactive to light.  wears glasses Ears:   EACs patent; TMs translucent and gray with good cone of light and bony landmarks.  Nose:  no nasal discharge.   Mouth:  pharynx pink and moist and fair dentition.   Neck:  no masses.   Chest Wall:  pacer in the L upper chest wall Breasts:  No mass, nodules, thickening, tenderness, bulging, retraction, inflamation, nipple discharge or skin changes noted.   Lungs:  Normal respiratory effort, chest expands symmetrically. Lungs are clear to auscultation, no crackles or wheezes. Heart:  normal rate, regular rhythm, and no murmur.   Abdomen:  Bowel sounds positive,abdomen soft and non-tender without masses, organomegaly or hernias noted. Msk:  no joint tenderness, no joint swelling, no joint warmth, and no redness over joints.   Pulses:  2+ radial and pedal pulses Extremities:  no LE edema Neurologic:  gait normal.   Skin:  color normal.   Cervical Nodes:  No lymphadenopathy noted Psych:  good eye contact, not  anxious appearing, and not depressed appearing.     Impression & Recommendations:  Problem # 1:  HEALTH MAINTENANCE EXAM (ICD-V70.0) Keeping healthy checklist for women reviewed. Tdap updated today. BP high -- did not take meds today.  Has been at goal previously. BMI 37 c/w class II obesity.  Work on [employer], regular exercise, wt loss. F/U for T2DM in 2 mos. Colonoscopy due 2015. DEXA due 2013, has osteopenia. Continue MVI + Caltrate D two times a day. F/U with Dr Stanford Breed in Sept. Fasting labs are UTD. Update mammogram.  Complete Medication List: 1)  Losartan Potassium 50 Mg Tabs (Losartan potassium) .[name].. 1 tab by mouth bid 2)  Lantus 100 Unit/ml Soln (Insulin glargine) .... Inject 15 units in the morning 3)  B-100 Complex Tabs (Vitamins-lipotropics) .... Take one tablet by mouth once a day 4)  Glucosamine-chondroitin 250-200 Mg Caps (Glucosamine-chondroitin) .... Take one tablet by mouth twice ad ay 5)  Arthritis Pain Relief 650 Mg Cr-tabs (Acetaminophen) .... Take two  tablets by mouth once a day 6)  Cymbalta 60 Mg Cpep (Duloxetine hcl) .... Take one tablet by mouth once ad ay 7)  Synthroid 50 Mcg Tabs (Levothyroxine sodium) .[name].. 1 tab by mouth daily 8)  Isosorbide Dinitrate 30 Mg Tabs (Isosorbide dinitrate) .... Take one tablet by mouth once ad ay 9)  Carvedilol 6.25 Mg Tabs (Carvedilol) .... Take one tablet by mouth twice a day 10)  Gabapentin 600 Mg Tabs (Gabapentin) .... Take 1 tab by mouth three times a day 11)  Bumetanide 1 Mg Tabs (Bumetanide) .... Take 1 tablet by mouth two times a day 12)  Multivitamins Tabs (Multiple vitamin) .... Take one tablet by mouth once ad ay 13)  Mucinex 600 Mg Xr12h-tab (Guaifenesin) .... Take one tablet by mouth once ad ay 14)  Calcium 600 1500 Mg Tabs (Calcium carbonate) .... 2 tablets once a day 15)  Advair Diskus 250-50 Mcg/dose Aepb (Fluticasone-salmeterol) .... Use one puff twice a day 16)  Bd Insulin Syringe 27.5g X 5/8" 2 Ml Misc (Insulin syringe-needle u-100) .... Use as directed 17)  Freestyle Lite Test Strp (Glucose blood) .... Use twice daily as directed 18)  Nexium 40 Mg Cpdr (Esomeprazole magnesium) .... Take 1 capsule by mouth once a day 19)  Singulair 10 Mg Tabs (Montelukast sodium) .... Take 1 tablet by mouth once a day 20)  Restasis 0.05 % Emul (Cyclosporine) .... One drop two times a day 21)  Tizanidine Hcl 4 Mg Tabs (Tizanidine hcl) .... 2 tablets two times a day 22)  Proair Hfa 108 (90 Base) Mcg/act Aers (Albuterol sulfate) .... As needed 23)  Accu-chek Soft Touch Lancets Misc (Lancets) .... Use twice daily as directed 24)  Pravastatin Sodium 40 Mg Tabs (Pravastatin sodium) .[name].. 1 tab by mouth qhs 25)  Lodrane 24 12 Mg Xr24h-cap (Brompheniramine maleate) .... 2 capsules by mouth once daily 26)  Glucosamine-chondroitin Caps (Glucosamine-chondroit-vit c-mn) .... Take one tablet by mouth twice daily.  Other Orders: T-Mammography Bilateral Screening (02585)  Patient Instructions: 1)  Return in 2 mos  for follow up diabetes.   Tetanus/Td Immunization History:    Tetanus/Td # 1:  Historical (08/24/2004)   Appended Document: CPE   Tetanus/Td Vaccine    Vaccine Type: Tdap    Site: right deltoid    Dose: 0.5 ml    Route: IM    Given by: Sherlean Foot CMA    Exp. Date: 11/16/2011    Lot #: [id number]    VIS given:  07/12/07 version given March 07, 2010.

## 2010-09-23 NOTE — Letter (Signed)
Summary: [facility]  [facility]   Imported By: Edmonia James 06/11/2010 10:15:21  _____________________________________________________________________  External Attachment:    Type:   Image     Comment:   External Document

## 2010-09-23 NOTE — Assessment & Plan Note (Signed)
Summary: f/u cholesterol   Vital Signs:  Patient profile:   60 year old female Height:      62.1 inches Weight:      199 pounds BMI:     36.41 O2 Sat:      96 % on Room air Pulse rate:   88 / minute BP sitting:   133 / 73  (left arm) Cuff size:   large  Vitals Entered By: Sherlean Foot CMA (November 07, 2009 9:37 AM)  O2 Flow:  Room air CC: F/U labs. Discuus plan for cholesterol   Primary Care Provider:  Loyal Gambler DO  CC:  F/U labs. Discuus plan for cholesterol.  History of Present Illness: 60 yo WF presents for f/u visit.    Since her last visit with me, she has had labs, DEXA scan and seen Dr Stanford Breed and orthopedics.  She has CHF.  She has not had an ECHO done.  Dr Stanford Breed requested her records from [facility].  She is set up to see EP folks for her pacemaker.  Her thyroid was stable on labs in Feb.  Her alk phos was high on labs  along with GGT.  A RUQ u/s normal other than fatty infiltration of the pancreas.    She had a distal radius ORIF and carpal tunnel release and sees Dr Mardelle Matte.  Her last LDL was 113.    Allergies: 1)  ! Quinine 2)  ! Sulfa 3)  ! Pcn 4)  ! Rifadin (Rifampin) 5)  ! Clindamycin 6)  ! Accupril 7)  ! * Baking Soda  Past History:  Past Surgical History: Reviewed history from 10/23/2009 and no changes required. pacemaker 2009 [facility] knee and foot surgery hysterectomy at 14 for bleeding Tubal ligation pen reduction and internal fixation, right distal  radius fracture, 2 pieces, with carpal tunnel release.   Family History: Reviewed history from 10/23/2009 and no changes required. brother CHF sister ulcer mother HTN, died of cancer, melanoma, parkinsons dz father HTN, died in his 85s from CHF and pacemaker  Social History: Reviewed history from 10/23/2009 and no changes required. newly divorced. Finished grade school only. Brother lives w/ her.  2 kids in [place] - a son and a daughter. Quit smoking in 1969. Walks 2 days/  wk. Alcohol Use - no  Review of Systems  The patient denies weight gain, vision loss, chest pain, and dyspnea on exertion.    Physical Exam  General:  obese WF in NAD Head:  normocephalic and atraumatic.   Eyes:  PERRLA Mouth:  pharynx pink and moist.   Neck:  no masses.   Lungs:  normal respiratory effort, no intercostal retractions, no accessory muscle use, normal breath sounds, no crackles, and no wheezes.   Heart:  normal rate, regular rhythm, and no murmur.   Extremities:  no LE edema Skin:  color normal.   Psych:  good eye contact, not anxious appearing, and not depressed appearing.     Impression & Recommendations:  Problem # 1:  HYPERLIPIDEMIA (BJS-283.4)  LDL goal is <100 with CHF, DM, HTN.  Will start pt on generic Pravastatin 40 mg at bedtime and recheck LDL with LFTs in 8 wks.  Work on diet, exercise, wt loss. Labs Reviewed: SGOT: 21 (10/10/2009)   SGPT: 35 (10/10/2009)   HDL:55 (10/10/2009)  LDL:113 (10/10/2009)  Chol:194 (10/10/2009)  Trig:130 (10/10/2009)  Her updated medication list for this problem includes:    Pravastatin Sodium 40 Mg Tabs (Pravastatin sodium) .[name]... 1 tab  by mouth qhs  Problem # 2:  CHF (ICD-428.0) Seeing Dr Stanford Breed.  She may need an ECHO to eval LV function. Her updated medication list for this problem includes:    Losartan Potassium 100 Mg Tabs (Losartan potassium) .[name]... 1/2 tab by mouth bid    Carvedilol 6.25 Mg Tabs (Carvedilol) .[name]... Take one tablet by mouth twice a day    Bumetanide 1 Mg Tabs (Bumetanide) .[name]... Take 1 tablet by mouth two times a day  Problem # 3:  ESSENTIAL HYPERTENSION, BENIGN (ICD-401.1) BP goal is <130/80, just above this today.  Recommend working on healthy diet, exercise and wt loss.  Reports feeling 'LOW' on a full tab of Losartan once daily, so will split it.   Her updated medication list for this problem includes:    Losartan Potassium 100 Mg Tabs (Losartan potassium) .[name]... 1/2 tab by mouth bid     Carvedilol 6.25 Mg Tabs (Carvedilol) .[name]... Take one tablet by mouth twice a day    Bumetanide 1 Mg Tabs (Bumetanide) .[name]... Take 1 tablet by mouth two times a day  BP today: 133/73 Prior BP: 138/84 (10/23/2009)  Labs Reviewed: K+: 4.4 (10/10/2009) Creat: : 0.66 (10/10/2009)   Chol: 194 (10/10/2009)   HDL: 55 (10/10/2009)   LDL: 113 (10/10/2009)   TG: 130 (10/10/2009)  Problem # 4:  DM (ICD-250.00) At goal on current meds, continue.  F/U in 2 mos. Her updated medication list for this problem includes:    Losartan Potassium 100 Mg Tabs (Losartan potassium) .[name]... 1/2 tab by mouth bid    Lantus 100 Unit/ml Soln (Insulin glargine) ..... Inject 15 units in the morning    Lantus 100 Unit/ml Soln (Insulin glargine) .[name]... As directed  Labs Reviewed: Creat: 0.66 (10/10/2009)   Microalbumin: 10 (07/02/2009)  Last Eye Exam: no retinopathy (Dr Laban Emperor) (06/13/2009) Reviewed HgBA1c results: 6.2 (10/03/2009)  6.3 (07/02/2009)  Complete Medication List: 1)  Losartan Potassium 100 Mg Tabs (Losartan potassium) .... 1/2 tab by mouth bid 2)  Lantus 100 Unit/ml Soln (Insulin glargine) .... Inject 15 units in the morning 3)  B-100 Complex Tabs (Vitamins-lipotropics) .... Take one tablet by mouth once a day 4)  Glucosamine-chondroitin 250-200 Mg Caps (Glucosamine-chondroitin) .... Take one tablet by mouth twice ad ay 5)  Arthritis Pain Relief 650 Mg Cr-tabs (Acetaminophen) .... Take two tablets by mouth once a day 6)  Cymbalta 60 Mg Cpep (Duloxetine hcl) .... Take one tablet by mouth once ad ay 7)  Levothyroxine Sodium 50 Mcg Tabs (Levothyroxine sodium) .... Take one tablet by mouth once aday 8)  Isosorbide Dinitrate 30 Mg Tabs (Isosorbide dinitrate) .... Take one tablet by mouth once ad ay 9)  Carvedilol 6.25 Mg Tabs (Carvedilol) .... Take one tablet by mouth twice a day 10)  Gabapentin 300 Mg Caps (Gabapentin) .... 2 tablets three times a day 11)  Bumetanide 1 Mg Tabs (Bumetanide) .... Take 1 tablet by  mouth two times a day 12)  Multivitamins Tabs (Multiple vitamin) .... Take one tablet by mouth once ad ay 13)  Mucinex 600 Mg Xr12h-tab (Guaifenesin) .... Take one tablet by mouth once ad ay 14)  Calcium 600 1500 Mg Tabs (Calcium carbonate) .... 2 tablets once a day 15)  Advair Diskus 250-50 Mcg/dose Aepb (Fluticasone-salmeterol) .... Use one puff twice a day 16)  Xyzal 5 Mg Tabs (Levocetirizine dihydrochloride) .[name].. 1 tab by mouth daily 17)  Bd Insulin Syringe 27.5g X 5/8" 2 Ml Misc (Insulin syringe-needle u-100) .... Use as directed 18)  Freestyle Lite Test Strp (Glucose blood) .... Use twice daily as directed 19)  Tramadol Hcl 50 Mg Tabs (Tramadol hcl) .[name].. 1 by mouth q 6 hrs as needed pain 20)  Lantus 100 Unit/ml Soln (Insulin glargine) .... As directed 21)  Nexium 40 Mg Cpdr (Esomeprazole magnesium) .... Take 1 capsule by mouth once a day 22)  Singulair 10 Mg Tabs (Montelukast sodium) .... Take 1 tablet by mouth once a day 23)  Restasis 0.05 % Emul (Cyclosporine) .... One drop two times a day 24)  Tizanidine Hcl 4 Mg Tabs (Tizanidine hcl) .... 2 tablets two times a day 25)  Proair Hfa 108 (90 Base) Mcg/act Aers (Albuterol sulfate) .... As needed 26)  Accu-chek Soft Touch Lancets Misc (Lancets) .... Use twice daily as directed 27)  Pravastatin Sodium 40 Mg Tabs (Pravastatin sodium) .[name].. 1 tab by mouth qhs  Patient Instructions: 1)  Work on low cholesterol diet with regular exercise and wt loss. 2)  Recheck LDL in 8 wks.   3)  Take Omega 3 Fish Oil 4 capsules once daily for cholesterol reduction. 4)  Cut Losartan in 1/2 and take 1/2 tab twice a day to see if fatigue improves. 5)  Return for follow up diabetes in 2 months. Prescriptions: PRAVASTATIN SODIUM 40 MG TABS (PRAVASTATIN SODIUM) 1 tab by mouth qhs  #30 x 1   Entered and Authorized by:   Loyal Gambler DO   Signed by:   Loyal Gambler DO on 11/11/2009   Method used:   Electronically to        [facility] # [id number]* (retail)        [address]       [place], [place]  [address]       Ph: [phone]       Fax: [phone]   RxID:   [id number] ACCU-CHEK SOFT TOUCH LANCETS  MISC (LANCETS) use twice daily as directed  #60 x 12   Entered and Authorized by:   Loyal Gambler DO   Signed by:   Loyal Gambler DO on 11/07/2009   Method used:   Electronically to        [facility] # [phone]* (retail)       [address]       [place],   [address]       Ph: [phone]       Fax: [phone]   RxID:   [id number]   Appended Document: f/u cholesterol Pls let pt know that I reviewed all of her records and Dr Stanford Breed wants her LDL to be <70.  Since she is 113, I do think she will need a statin to get her to goal.  I started her on pravastatin 40 mg at bedtime.  Repeat labs in 8 wks.  Call if any problems.  Loyal Gambler, D.O.  Appended Document: f/u cholesterol Pt states she is still not comfortable taking any Rx. Pt wants to continue fish oil and exercise before starting any Rx. I advised Pt to call Dr. Jacalyn Lefevre office to discuss since she is refusing. Pt agreed.

## 2010-09-23 NOTE — Letter (Signed)
Summary: [facility]   Imported By: Sallee Provencal 04/03/2010 12:43:59  _____________________________________________________________________  External Attachment:    Type:   Image     Comment:   External Document

## 2010-09-23 NOTE — Letter (Signed)
Summary: [facility] - Electrophysiology Lab Procedure  [facility] - Elect[facility] Procedure   Imported By: Marilynne Drivers 11/21/2009 13:17:32  _____________________________________________________________________  External Attachment:    Type:   Image     Comment:   External Document

## 2010-09-23 NOTE — Progress Notes (Signed)
Summary: Rx. Request  Phone Note Refill Request Message from:  Glynn on May 22, 2010 3:50 PM  pt. states that [facility] gave her a sample of the Lantus Solarus Pen and she now needs a Rx. called into [facility] for this.Otho Ket  May 22, 2010 3:52 PM   Initial call taken by: Otho Ket,  May 22, 2010 3:52 PM    New/Updated Medications: LANTUS SOLOSTAR 100 UNIT/ML SOLN (INSULIN GLARGINE) 15 units [place] injection qAM Prescriptions: LANTUS SOLOSTAR 100 UNIT/ML SOLN (INSULIN GLARGINE) 15 units [place] injection qAM  #3 boxes x 1   Entered and Authorized by:   Loyal Gambler DO   Signed by:   Loyal Gambler DO on 05/23/2010   Method used:   Print then Give to Patient   RxID:   [id number]

## 2010-09-23 NOTE — Miscellaneous (Signed)
Summary: DEXA: osteopenia  Clinical Lists Changes  Problems: Added new problem of OSTEOPENIA (ICD-733.90)

## 2010-09-23 NOTE — Assessment & Plan Note (Signed)
Summary: new dx of breast cancer   Vital Signs:  Patient profile:   60 year old female Height:      62.1 inches Weight:      208 pounds BMI:     38.06 O2 Sat:      96 % on Room air Pulse rate:   85 / minute BP sitting:   110 / 68  (left arm) Cuff size:   large  Vitals Entered By: Sherlean Foot CMA (March 26, 2010 11:32 AM)  O2 Flow:  Room air CC: F/U biopsy   Primary Care Provider:  Loyal Gambler DO  CC:  F/U biopsy.  History of Present Illness: 60 yo WF presents for a new dx of L breast cancer.  She had an abnormal mammogram followed by a needle biopsy with path showing invasive ductal carcinoma.  She has a long fam hx of breast cancer, inclduing her sister.    Her spirits are good.  She has support from her family and her church.  She is feeling well, healed up from her biopsy.  She has appt with Dr Dalbert Batman tomorrow (gen surgery).  She is not set up to see oncology yet.    Current Medications (verified): 1)  Losartan Potassium 50 Mg Tabs (Losartan Potassium) .[name].. 1 Tab By Mouth Bid 2)  Lantus 100 Unit/ml Soln (Insulin Glargine) .... Inject 15 Units in The Morning 3)  B-100 Complex  Tabs (Vitamins-Lipotropics) .... Take One Tablet By Mouth Once A Day 4)  Glucosamine-Chondroitin 250-200 Mg Caps (Glucosamine-Chondroitin) .... Take One Tablet By Mouth Twice Ad Ay 5)  Arthritis Pain Relief 650 Mg Cr-Tabs (Acetaminophen) .... Take Two Tablets By Mouth Once A Day 6)  Cymbalta 60 Mg Cpep (Duloxetine Hcl) .... Take One Tablet By Mouth Once Ad Ay 7)  Synthroid 50 Mcg Tabs (Levothyroxine Sodium) .[name].. 1 Tab By Mouth Daily 8)  Isosorbide Dinitrate 30 Mg Tabs (Isosorbide Dinitrate) .... Take One Tablet By Mouth Once Ad Ay 9)  Carvedilol 6.25 Mg Tabs (Carvedilol) .... Take One Tablet By Mouth Twice A Day 10)  Gabapentin 600 Mg Tabs (Gabapentin) .... Take 1 Tab By Mouth Three Times A Day 11)  Bumetanide 1 Mg Tabs (Bumetanide) .... Take 1 Tablet By Mouth Two Times A Day 12)  Multivitamins   Tabs (Multiple Vitamin) .... Take One Tablet By Mouth Once Ad Ay 13)  Mucinex 600 Mg Xr12h-Tab (Guaifenesin) .... Take One Tablet By Mouth Once Ad Ay 14)  Calcium 600 1500 Mg Tabs (Calcium Carbonate) .... 2 Tablets Once A Day 15)  Advair Diskus 250-50 Mcg/dose Aepb (Fluticasone-Salmeterol) .... Use One Puff Twice A Day 16)  Bd Insulin Syringe 27.5g X 5/8" 2 Ml Misc (Insulin Syringe-Needle U-100) .... Use As Directed 17)  Freestyle Lite Test  Strp (Glucose Blood) .... Use Twice Daily As Directed 18)  Nexium 40 Mg Cpdr (Esomeprazole Magnesium) .... Take 1 Capsule By Mouth Once A Day 19)  Singulair 10 Mg Tabs (Montelukast Sodium) .... Take 1 Tablet By Mouth Once A Day 20)  Restasis 0.05 % Emul (Cyclosporine) .... One Drop Two Times A Day 21)  Tizanidine Hcl 4 Mg Tabs (Tizanidine Hcl) .... 2 Tablets Two Times A Day 22)  Proair Hfa 108 (90 Base) Mcg/act Aers (Albuterol Sulfate) .... As Needed 23)  Accu-Chek Soft Touch Lancets  Misc (Lancets) .... Use Twice Daily As Directed 24)  Pravastatin Sodium 40 Mg Tabs (Pravastatin Sodium) .[name].. 1 Tab By Mouth Qhs 25)  Lodrane 24 12 Mg  Xr24h-Cap (Brompheniramine Maleate) .... 2 Capsules By Mouth Once Daily 26)  Glucosamine-Chondroitin   Caps (Glucosamine-Chondroit-Vit C-Mn) .... Take One Tablet By Mouth Twice Daily.  Allergies (verified): 1)  ! Quinine 2)  ! Sulfa 3)  ! Pcn 4)  ! Rifadin (Rifampin) 5)  ! Clindamycin 6)  ! Accupril 7)  ! * Baking Soda  Past History:  Past Medical History: Reviewed history from 10/23/2009 and no changes required. Asthma H/O PUD ESSENTIAL HYPERTENSION, BENIGN (ICD-401.1) CHF (ICD-428.0) LBBB ALKALINE PHOSPHATASE, ELEVATED (ICD-790.5) DIABETIC PERIPHERAL NEUROPATHY (ICD-250.60) UNSPECIFIED HYPOTHYROIDISM (ICD-244.9) CLOSED FRACTURE OF UNSPECIFIED PART OF RADIUS (ICD-813.81) DEPRESSION (ICD-311) BACK PAIN, CHRONIC (ICD-724.5) ALLERGIC RHINITIS CAUSE UNSPECIFIED (ICD-477.9) DM (ICD-250.00) anxiety/  depression Lumbar DDD, chronic pain, Dr Sabra Heck at [facility] carpal tunnel IBS  Social History: Reviewed history from 10/23/2009 and no changes required. newly divorced. Finished grade school only. Brother lives w/ her.  2 kids in [place] - a son and a daughter. Quit smoking in 1969. Walks 2 days/ wk. Alcohol Use - no  Review of Systems      See HPI  Physical Exam  General:  alert, well-developed, well-nourished, and well-hydrated.   Psych:  good eye contact, not anxious appearing, and not depressed appearing.     Impression & Recommendations:  Problem # 1:  INVASIVE DUCTAL CARCINOMA, LEFT BREAST (ICD-174.9) Assessment New Counseled face to face x 15 min on her dx. She is handling the news quite well and has family/ church support.   She has appt with Dr Dalbert Batman tomorrow to discuss surgery. She will let me know if she needs anthing.  Complete Medication List: 1)  Losartan Potassium 50 Mg Tabs (Losartan potassium) .[name].. 1 tab by mouth bid 2)  Lantus 100 Unit/ml Soln (Insulin glargine) .... Inject 15 units in the morning 3)  B-100 Complex Tabs (Vitamins-lipotropics) .... Take one tablet by mouth once a day 4)  Glucosamine-chondroitin 250-200 Mg Caps (Glucosamine-chondroitin) .... Take one tablet by mouth twice ad ay 5)  Arthritis Pain Relief 650 Mg Cr-tabs (Acetaminophen) .... Take two tablets by mouth once a day 6)  Cymbalta 60 Mg Cpep (Duloxetine hcl) .... Take one tablet by mouth once ad ay 7)  Synthroid 50 Mcg Tabs (Levothyroxine sodium) .[name].. 1 tab by mouth daily 8)  Isosorbide Dinitrate 30 Mg Tabs (Isosorbide dinitrate) .... Take one tablet by mouth once ad ay 9)  Carvedilol 6.25 Mg Tabs (Carvedilol) .... Take one tablet by mouth twice a day 10)  Gabapentin 600 Mg Tabs (Gabapentin) .... Take 1 tab by mouth three times a day 11)  Bumetanide 1 Mg Tabs (Bumetanide) .... Take 1 tablet by mouth two times a day 12)  Multivitamins Tabs (Multiple vitamin) .... Take one tablet  by mouth once ad ay 13)  Mucinex 600 Mg Xr12h-tab (Guaifenesin) .... Take one tablet by mouth once ad ay 14)  Calcium 600 1500 Mg Tabs (Calcium carbonate) .... 2 tablets once a day 15)  Advair Diskus 250-50 Mcg/dose Aepb (Fluticasone-salmeterol) .... Use one puff twice a day 16)  Bd Insulin Syringe 27.5g X 5/8" 2 Ml Misc (Insulin syringe-needle u-100) .... Use as directed 17)  Freestyle Lite Test Strp (Glucose blood) .... Use twice daily as directed 18)  Nexium 40 Mg Cpdr (Esomeprazole magnesium) .... Take 1 capsule by mouth once a day 19)  Singulair 10 Mg Tabs (Montelukast sodium) .... Take 1 tablet by mouth once a day 20)  Restasis 0.05 % Emul (Cyclosporine) .... One drop two times a day 21)  Tizanidine Hcl 4 Mg Tabs (Tizanidine hcl) .... 2 tablets two times a day 22)  Proair Hfa 108 (90 Base) Mcg/act Aers (Albuterol sulfate) .... As needed 23)  Accu-chek Soft Touch Lancets Misc (Lancets) .... Use twice daily as directed 24)  Pravastatin Sodium 40 Mg Tabs (Pravastatin sodium) .[name].. 1 tab by mouth qhs 25)  Allegra 180 Mg Tabs (Fexofenadine hcl) .[name].. 1 tab by mouth daily 26)  Glucosamine-chondroitin Caps (Glucosamine-chondroit-vit c-mn) .... Take one tablet by mouth twice daily.  Patient Instructions: 1)  Try Ayr Gel for nasal comfort. 2)  Stay on Allegra OTC for allergies.   3)  Let me know if you need anything, including a pre-op clearance. 4)  Return for diabetes check up in 4-6 wks.

## 2010-09-23 NOTE — Letter (Signed)
Summary: [facility]   Imported By: Edmonia James 06/11/2010 10:19:32  _____________________________________________________________________  External Attachment:    Type:   Image     Comment:   External Document

## 2010-09-23 NOTE — Procedures (Signed)
Summary: [facility]   Imported By: Marilynne Drivers 11/21/2009 13:14:14  _____________________________________________________________________  External Attachment:    Type:   Image     Comment:   External Document

## 2010-09-23 NOTE — Letter (Signed)
Summary: [facility]   Imported By: Edmonia James 04/15/2010 12:50:23  _____________________________________________________________________  External Attachment:    Type:   Image     Comment:   External Document

## 2010-09-23 NOTE — Letter (Signed)
Summary: [facility]   Imported By: Edmonia James 07/15/2010 15:02:41  _____________________________________________________________________  External Attachment:    Type:   Image     Comment:   External Document

## 2010-09-23 NOTE — Progress Notes (Signed)
Summary: NEEDS OV  Phone Note Outgoing Call   Call placed by: Loyal Gambler DO,  March 24, 2010 1:13 PM Summary of Call: PLEASE SCHEDULE PT OV TODAY OR TOMORROW TO REVIEW BIOSPY RESULTS. Initial call taken by: Loyal Gambler DO,  March 25, 2010 10:22 AM  New Problems: INVASIVE DUCTAL CARCINOMA, LEFT BREAST (ICD-174.9)   New Problems: INVASIVE DUCTAL CARCINOMA, LEFT BREAST (ICD-174.9)  Appended Document: NEEDS OV Pt scheduled tomorrow at 11:30.

## 2010-09-23 NOTE — Letter (Signed)
Summary: Letter to Patient Regarding Lab Results/[facility]  Letter to Patient Regarding Lab Results/[facility]   Imported By: Edmonia James 09/30/2009 10:52:20  _____________________________________________________________________  External Attachment:    Type:   Image     Comment:   External Document

## 2010-09-23 NOTE — Op Note (Signed)
Summary: [facility]  [facility]   Imported By: Marilynne Drivers 11/21/2009 13:06:46  _____________________________________________________________________  External Attachment:    Type:   Image     Comment:   External Document

## 2010-09-23 NOTE — Assessment & Plan Note (Signed)
Summary: f/u DM   Vital Signs:  Patient profile:   60 year old female Height:      62.1 inches Weight:      200 pounds BMI:     36.59 O2 Sat:      96 % on Room air Temp:     98.0 degrees F oral Pulse rate:   90 / minute BP sitting:   114 / 73  (left arm) Cuff size:   regular  Vitals Entered By: Sherlean Foot CMA/april (October 03, 2009 10:17 AM)  O2 Flow:  Room air CC: wants to discuss a heart dr referral and is having pain on the bottom of her right foot   Primary Care Provider:  Loyal Gambler DO  CC:  wants to discuss a heart dr referral and is having pain on the bottom of her right foot.  History of Present Illness: 60 yo WF presents for a f/u DM visit.    She fractured her R distal radius last wk after tripping over a dog at home.  She had transition lenses on and it was too dark.  Her ORIF is schedule surgery date with Dr Mardelle Matte is tomorrow.    She is having pain and swelling in the R foot.  Hx of diabetic neuropathy.  She is on Lantus once daily with good control of home sugars.    She is due for fasting labs.  She is due for a RF of Neurontin   Allergies (verified): 1)  ! Quinine 2)  ! Sulfa 3)  ! Pcn 4)  ! Rifadin (Rifampin) 5)  ! Clindamycin 6)  ! Accupril 7)  ! * Baking Soda  Review of Systems      See HPI  Physical Exam  General:  obese WF appears older than stated age in NAD Head:  normocephalic and atraumatic.   Eyes:  pupils equal, pupils round, and pupils reactive to light.   Nose:  no nasal discharge.   Mouth:  pharynx pink and moist and fair dentition.   Neck:  no masses.   Lungs:  normal respiratory effort, no intercostal retractions, no accessory muscle use, normal breath sounds, no crackles, and no wheezes.   Heart:  normal rate, regular rhythm, and no murmur.   Abdomen:  soft and non-tender.   Msk:  gait normal. R forearm in cast/ sling R foot tender along longitudinal arch and in between toes 2 and 3 and 4 over the dorusm but no edema,  bruising or redness Pulses:  2+ pedal pulses Extremities:  no LE edema mild R hand edema -- with R forearm cast Skin:  color normal.  dry cracked heels Psych:  flat affect.     Impression & Recommendations:  Problem # 1:  DM (ICD-250.00) A1C 6.2--> 6.3, great.  Continue current dose of Lantus, working on diet, exercise, wt. Her updated medication list for this problem includes:    Losartan Potassium 100 Mg Tabs (Losartan potassium) .[name]... 1 tab by mouth daily    Lantus 100 Unit/ml Soln (Insulin glargine) ..... Inject 16 units in the morning  Orders: Fingerstick (35009) Hemoglobin A1C (83036) T-Lipid Profile (38182-99371) T-Comprehensive Metabolic Panel (69678-93810)  Labs Reviewed: Microalbumin: 10 (07/02/2009)  Last Eye Exam: no retinopathy (Dr Laban Emperor) (06/13/2009) Reviewed HgBA1c results: 6.2 (10/03/2009)  6.3 (07/02/2009)  Problem # 2:  CHF (ICD-428.0) Due to insurance changes, she needs to switch from [facility] and is interested in est care with .  I do not have all  of her old cardiac records in re: to CHF.  She is clinically stable on current meds. Her updated medication list for this problem includes:    Losartan Potassium 100 Mg Tabs (Losartan potassium) .[name]... 1 tab by mouth daily    Carvedilol 6.25 Mg Tabs (Carvedilol) .[name]... Take one tablet by mouth twice a day    Bumetanide 1 Mg Tabs (Bumetanide) .[name]... Take one tablet by mouth oncea d ay  Orders: T-CBC No Diff (56387-56433) T-BNP  (B Natriuretic Peptide) (29518-84166) Cardiology Referral (Cardiology)  Problem # 3:  UNSPECIFIED HYPOTHYROIDISM (ICD-244.9) REcheck TSH and adjust thyroid medication dose next wk. Her updated medication list for this problem includes:    Levothyroxine Sodium 50 Mcg Tabs (Levothyroxine sodium) .[name]... Take one tablet by mouth once aday  Orders: T-TSH (06301-60109)  Problem # 4:  DIABETIC PERIPHERAL NEUROPATHY (ICD-250.60) RFd her Neurontin and wrote RX for diabetic  shoes today.  This is likely the cuase for her R foot pain but it may also be neuromas.  Since she is getting ready to have an ORIF tomorrow, will hold off on Podiatry referral.  Can try change to a compounded cream.   Her updated medication list for this problem includes:    Losartan Potassium 100 Mg Tabs (Losartan potassium) .[name]... 1 tab by mouth daily    Lantus 100 Unit/ml Soln (Insulin glargine) ..... Inject 16 units in the morning  Problem # 5:  CLOSED FRACTURE OF UNSPECIFIED PART OF RADIUS (NAT-557.32) Has surgery with Dr Mardelle Matte tomorrow.  Complete Medication List: 1)  Losartan Potassium 100 Mg Tabs (Losartan potassium) .[name].. 1 tab by mouth daily 2)  Lantus 100 Unit/ml Soln (Insulin glargine) .... Inject 16 units in the morning 3)  B-100 Complex Tabs (Vitamins-lipotropics) .... Take one tablet by mouth once a day 4)  Glucosamine-chondroitin 250-200 Mg Caps (Glucosamine-chondroitin) .... Take one tablet by mouth twice ad ay 5)  Dilacor Xr 120 Mg Xr24h-cap (Diltiazem hcl) .... Take one tablet by mouth twice ad ay 6)  Arthritis Pain Relief 650 Mg Cr-tabs (Acetaminophen) .... Take two tablets by mouth once a day 7)  Cymbalta 60 Mg Cpep (Duloxetine hcl) .... Take one tablet by mouth once ad ay 8)  Omeprazole 20 Mg Cpdr (Omeprazole) .... Take one tablet by mouth once a day 9)  Tizanidine Hcl 4 Mg Tabs (Tizanidine hcl) .... Take one tablet by mouth twice a day 10)  Levothyroxine Sodium 50 Mcg Tabs (Levothyroxine sodium) .... Take one tablet by mouth once aday 11)  Isosorbide Dinitrate 30 Mg Tabs (Isosorbide dinitrate) .... Take one tablet by mouth once ad ay 12)  Carvedilol 6.25 Mg Tabs (Carvedilol) .... Take one tablet by mouth twice a day 13)  Gabapentin 600 Mg Tabs (Gabapentin) .[name].. 1 tab by mouth bid 14)  Bumetanide 1 Mg Tabs (Bumetanide) .... Take one tablet by mouth oncea d ay 15)  Multivitamins Tabs (Multiple vitamin) .... Take one tablet by mouth once ad ay 16)  Mucinex 600 Mg Xr12h-tab  (Guaifenesin) .... Take one tablet by mouth once ad ay 17)  Calcium 600 1500 Mg Tabs (Calcium carbonate) .... Take one tablet by mouth once ad ay 18)  Advair Diskus 250-50 Mcg/dose Aepb (Fluticasone-salmeterol) .... Use one puff twice a day 19)  Proventil Hfa 108 (90 Base) Mcg/act Aers (Albuterol sulfate) .... As needed 20)  Xyzal 5 Mg Tabs (Levocetirizine dihydrochloride) .[name].. 1 tab by mouth daily 21)  Bd Insulin Syringe 27.5g X 5/8" 2 Ml Misc (Insulin syringe-needle u-100) .Marland KitchenMarland KitchenMarland Kitchen  Use as directed 22)  Freestyle Lite Test Strp (Glucose blood) .... Use twice daily as directed 23)  Tramadol Hcl 50 Mg Tabs (Tramadol hcl) .[name].. 1 by mouth q 6 hrs as needed pain 24)  Diabetic Shoes  .... Dx: diabetic neuropathy  Patient Instructions: 1)  Good luck with R wrist surgery tomorrow! 2)  Have fasting labs drawn downstairs next wk. 3)  Will call you w/ the results. 4)  Will set you up with cardiology downstairs. 5)  Take Gabapentin for diabetic foot pain. 6)  RX given for diabetic shoes. 7)  REturn for f/u in 3 months. Prescriptions: DIABETIC SHOES Dx: Diabetic Neuropathy  #1 pair x 0   Entered and Authorized by:   Loyal Gambler DO   Signed by:   Loyal Gambler DO on 10/03/2009   Method used:   Print then Give to Patient   RxID:   [id number] GABAPENTIN 600 MG TABS (GABAPENTIN) 1 tab by mouth bid  #180 x 1   Entered and Authorized by:   Loyal Gambler DO   Signed by:   Loyal Gambler DO on 10/03/2009   Method used:   Print then Give to Patient   RxID:   [id number] FREESTYLE LITE TEST  STRP (GLUCOSE BLOOD) use twice daily as directed  #3 boxes x 1   Entered and Authorized by:   Loyal Gambler DO   Signed by:   Loyal Gambler DO on 10/03/2009   Method used:   Print then Give to Patient   RxID:   [id number] ADVAIR DISKUS 250-50 MCG/DOSE AEPB (FLUTICASONE-SALMETEROL) Use one puff twice a day  #3 x 1   Entered and Authorized by:   Loyal Gambler DO   Signed by:   Loyal Gambler DO on 10/03/2009   Method  used:   Print then Give to Patient   RxID:   [id number] BUMETANIDE 1 MG TABS (BUMETANIDE) take one tablet by mouth oncea d ay  #90 x 1   Entered and Authorized by:   Loyal Gambler DO   Signed by:   Loyal Gambler DO on 10/03/2009   Method used:   Print then Give to Patient   RxID:   [id number] CARVEDILOL 6.25 MG TABS (CARVEDILOL) Take one tablet by mouth twice a day  #180 x 1   Entered and Authorized by:   Loyal Gambler DO   Signed by:   Loyal Gambler DO on 10/03/2009   Method used:   Print then Give to Patient   RxID:   [id number] ISOSORBIDE DINITRATE 30 MG TABS (ISOSORBIDE DINITRATE) take one tablet by mouth once ad ay  #90 x 1   Entered and Authorized by:   Loyal Gambler DO   Signed by:   Loyal Gambler DO on 10/03/2009   Method used:   Print then Give to Patient   RxID:   [id number] TIZANIDINE HCL 4 MG TABS (TIZANIDINE HCL) Take one tablet by mouth twice a day  #180 x 1   Entered and Authorized by:   Loyal Gambler DO   Signed by:   Loyal Gambler DO on 10/03/2009   Method used:   Print then Give to Patient   RxID:   [id number] DILACOR XR 120 MG XR24H-CAP (DILTIAZEM HCL) take one tablet by mouth twice ad ay  #180 x 1   Entered and Authorized by:   Loyal Gambler DO   Signed by:   Loyal Gambler DO on 10/03/2009   Method used:   Print then Give to  Patient   RxID:   [id number] BD INSULIN SYRINGE 27.5G X 5/8" 2 ML MISC (INSULIN SYRINGE-NEEDLE U-100) use as directed  #90 x 1   Entered and Authorized by:   Loyal Gambler DO   Signed by:   Loyal Gambler DO on 10/03/2009   Method used:   Print then Give to Patient   RxID:   [id number] [place] HFA 108 (90 BASE) MCG/ACT AERS (ALBUTEROL SULFATE) as needed  #2 x 1   Entered and Authorized by:   Loyal Gambler DO   Signed by:   Loyal Gambler DO on 10/03/2009   Method used:   Print then Give to Patient   RxID:   [id number] PJPETKKO POTASSIUM 100 MG TABS (LOSARTAN POTASSIUM) 1 tab by mouth daily  #90 x 1   Entered and  Authorized by:   Loyal Gambler DO   Signed by:   Loyal Gambler DO on 10/03/2009   Method used:   Print then Give to Patient   RxID:   [id number]   Laboratory Results   Blood Tests     HGBA1C: 6.2%   (Normal Range: Non-Diabetic - 3-6%   Control Diabetic - 6-8%)

## 2010-09-23 NOTE — Letter (Signed)
Summary: Dr Erenest Blank Office Note   Dr Edsel Petrin Ingram's Office Note   Imported By: Sallee Provencal 05/22/2010 11:19:06  _____________________________________________________________________  External Attachment:    Type:   Image     Comment:   External Document

## 2010-09-23 NOTE — Letter (Signed)
Summary: [facility]   Imported By: Edmonia James 07/11/2010 08:48:24  _____________________________________________________________________  External Attachment:    Type:   Image     Comment:   External Document

## 2010-09-23 NOTE — Progress Notes (Signed)
Summary: refill request  Phone Note Refill Request Message from:  Patient on April 21, 2010 9:14 AM  Refills Requested: Medication #1:  DILTIAZEM HCL ER BEADS 120 MG XR24H-CAP Take 1 capsule by mouth two times a day. [facility] [address]   Method Requested: Telephone to Pharmacy Initial call taken by: Lorenda Hatchet,  April 21, 2010 9:15 AM    Prescriptions: DILTIAZEM HCL ER BEADS 120 MG XR24H-CAP (DILTIAZEM HCL ER BEADS) Take 1 capsule by mouth two times a day  #30 x 6   Entered by:   Mignon Pine, RMA   Authorized by:   Sophronia Simas, MD, [facility]   Signed by:   Mignon Pine, RMA on 04/21/2010   Method used:   Electronically to        [facility] # [phone]* (retail)       [address]       [place], [place]  [address]       Ph: [phone]       Fax: [phone]   RxID:   [id number]

## 2010-09-23 NOTE — Letter (Signed)
Summary: [facility]   Imported By: Phillis Knack 06/24/2010 10:55:31  _____________________________________________________________________  External Attachment:    Type:   Image     Comment:   External Document

## 2010-09-23 NOTE — Op Note (Signed)
Summary: [facility]   Imported By: Marilynne Drivers 11/21/2009 12:54:59  _____________________________________________________________________  External Attachment:    Type:   Image     Comment:   External Document

## 2010-09-23 NOTE — Assessment & Plan Note (Signed)
Summary: FALL/WB   Vital Signs:  Patient Profile:   60 Years Old Female CC:      Fall- stumbled in to refridgerator hitting head, right wrist pain, left pinky pain, abrasion to left knee with swelling Height:     62.1 inches O2 Sat:      97 % O2 treatment:    Room Air Temp:     97.0 degrees F oral Pulse rate:   78 / minute Pulse rhythm:   pace maker Resp:     22 per minute BP sitting:   151 / 86  (left arm) Cuff size:   large  Pt. in pain?   yes    Location:   right wrist, left knee, left pinky finger  Vitals Entered By: Nolon Nations, RN              Is Patient Diabetic? Yes  CBG Result 122      Updated Prior Medication List: LOSARTAN POTASSIUM 100 MG TABS (LOSARTAN POTASSIUM) 1 tab by mouth daily LANTUS 100 UNIT/ML SOLN (INSULIN GLARGINE) Inject 16 units in the morning B-100 COMPLEX  TABS (VITAMINS-LIPOTROPICS) take one tablet by mouth once a day GLUCOSAMINE-CHONDROITIN 250-200 MG CAPS (GLUCOSAMINE-CHONDROITIN) take one tablet by mouth twice ad ay DILACOR XR 120 MG XR24H-CAP (DILTIAZEM HCL) take one tablet by mouth twice ad ay ARTHRITIS PAIN RELIEF 650 MG CR-TABS (ACETAMINOPHEN) take two tablets by mouth once a day CYMBALTA 60 MG CPEP (DULOXETINE HCL) take one tablet by mouth once ad ay OMEPRAZOLE 20 MG CPDR (OMEPRAZOLE) take one tablet by mouth once a day TIZANIDINE HCL 4 MG TABS (TIZANIDINE HCL) Take one tablet by mouth twice a day LEVOTHYROXINE SODIUM 50 MCG TABS (LEVOTHYROXINE SODIUM) take one tablet by mouth once aday ISOSORBIDE DINITRATE 30 MG TABS (ISOSORBIDE DINITRATE) take one tablet by mouth once ad ay CARVEDILOL 6.25 MG TABS (CARVEDILOL) Take one tablet by mouth twice a day GABAPENTIN 300 MG CAPS (GABAPENTIN) Take two tablets by mouth three times a day BUMETANIDE 1 MG TABS (BUMETANIDE) take one tablet by mouth oncea d ay MULTIVITAMINS  TABS (MULTIPLE VITAMIN) take one tablet by mouth once ad ay MUCINEX 600 MG XR12H-TAB (GUAIFENESIN) take one tablet by mouth  once ad ay CALCIUM 600 1500 MG TABS (CALCIUM CARBONATE) take one tablet by mouth once ad ay ADVAIR DISKUS 250-50 MCG/DOSE AEPB (FLUTICASONE-SALMETEROL) Use one puff twice a day PROVENTIL HFA 108 (90 BASE) MCG/ACT AERS (ALBUTEROL SULFATE) as needed XYZAL 5 MG TABS (LEVOCETIRIZINE DIHYDROCHLORIDE) 1 tab by mouth daily BD INSULIN SYRINGE 27.5G X 5/8" 2 ML MISC (INSULIN SYRINGE-NEEDLE U-100) use as directed FREESTYLE LITE TEST  STRP (GLUCOSE BLOOD) use twice daily as directed  Current Allergies (reviewed today): ! QUININE ! SULFA ! PCN ! RIFADIN (RIFAMPIN) ! CLINDAMYCIN ! ACCUPRIL ! * BAKING SODA History of Present Illness History from: patient Chief Complaint: Fall- stumbled in to refridgerator hitting head, right wrist pain, left pinky pain, abrasion to left knee with swelling History of Present Illness: PATIENT STATES HE CAME IN DOORS FROM WALKING AND HER GLASSES DID NOT CHANGE QUICK ENOUGH CAUSING HER TO STUMBLE AND FALL INTO THE [place]. C/O NOSE PAIN . RIGHT FOREARM PAN AND LEFT KNEE PAIN. DENIES LOC. ABLE TO WALK AFTERWARD. MAIN CONCERN IS RIGHT FOREARM. ALSO STATE HER CHRONIC PAIN MED HAS BEEN CHANGED AND IT MAKES HER FEEL FUNNY.   REVIEW OF SYSTEMS Constitutional Symptoms       Complains of fatigue.     Denies fever, chills, night sweats,  weight loss, and weight gain.  Eyes       Denies change in vision, eye pain, eye discharge, glasses, contact lenses, and eye surgery. Ear/Nose/Throat/Mouth       Denies hearing loss/aids, change in hearing, ear pain, ear discharge, dizziness, frequent runny nose, frequent nose bleeds, sinus problems, sore throat, hoarseness, and tooth pain or bleeding.  Respiratory       Denies dry cough, productive cough, wheezing, shortness of breath, asthma, bronchitis, and emphysema/COPD.  Cardiovascular       Denies murmurs, chest pain, and tires easily with exhertion.    Gastrointestinal       Complains of nausea/vomiting.      Denies stomach pain,  diarrhea, constipation, blood in bowel movements, and indigestion. Genitourniary       Denies painful urination, kidney stones, and loss of urinary control. Neurological       Complains of headaches.      Denies paralysis, seizures, and fainting/blackouts.      Comments: syncope Musculoskeletal       Complains of muscle pain, joint pain, decreased range of motion, and swelling.      Denies joint stiffness, redness, muscle weakness, and gout.      Comments: right wrist, left knee Skin       Denies bruising, unusual mles/lumps or sores, and hair/skin or nail changes.      Comments: abrasion to left knee with edema Psych       Denies mood changes, temper/anger issues, anxiety/stress, speech problems, depression, and sleep problems.  Past History:  Past Surgical History: Last updated: 04/26/2009 pacemaker 2009 [facility] teeth knee and foot surgery hysterectomy at 34 for bleeding  Family History: Last updated: 04/26/2009 brother CHF sister ulcer mother HTN, died of cancer, melanoma, parkinsons dz father HTN, died in his 15s from CHF  Social History: Last updated: 04/26/2009 newly divorced. Finished grade school only. Brother lives w/ her.  2 kids in [place] - a son and a daughter. Quit smoking in 1969. Walks 2 days/ wk.  Past Medical History: CHF HTN T2DM recurrent sinusitis/ allergies anxiety/ depression Lumbar DDD, chronic pain, Dr Sabra Heck at [facility] Neurologic carpal tunnel IBS [facility] (Dr Elyn Peers).    Family History: Reviewed history from 04/26/2009 and no changes required. brother CHF sister ulcer mother HTN, died of cancer, melanoma, parkinsons dz father HTN, died in his 90s from CHF  Social History: Reviewed history from 04/26/2009 and no changes required. newly divorced. Finished grade school only. Brother lives w/ her.  2 kids in [place] - a son and a daughter. Quit smoking in 1969. Walks 2 days/ wk. Physical Exam General appearance: well  developed, well nourished, no acute distress Head: AGRASIN TO NOSE WITH SLIGHT SWELLING ABRASION TO UPPER LIP. TEETH INTACT Eyes: conjunctivae and lids normal Pupils: equal, round, reactive to light Ears: normal, no lesions or deformities Neck: neck supple,  trachea midline, no masses Chest/Lungs: no rales, wheezes, or rhonchi bilateral, breath sounds equal without effort Heart: regular rate and  rhythm, no murmur Extremities: PAIN RIGHT FOREARM. NO DEFORMITY. NO SWELLING. SKIN INTACT. N/V INTACT. ABRASION CONTUSION LATERAL LEFT KNEE. ROM INTACT. ABLE TO AMBULATE.  Neurological: grossly intact and non-focal Assessment New Problems: CLOSED FRACTURE OF UNSPECIFIED PART OF RADIUS (ICD-813.81) FALL, HX OF (ICD-V15.88)   Plan New Medications/Changes: TRAMADOL HCL 50 MG TABS (TRAMADOL HCL) 1 by mouth q 6 hrs as needed pain  #30 x 0, 09/26/2009, Orvilla Fus DO  New Orders: T-DG Ronn Melena* [[id number]] T-DG Nasal  Bones [70160] New Patient Level III [99203]   Prescriptions: TRAMADOL HCL 50 MG TABS (TRAMADOL HCL) 1 by mouth q 6 hrs as needed pain  #30 x 0   Entered and Authorized by:   Orvilla Fus DO   Signed by:   Orvilla Fus DO on 09/26/2009   Method used:   Electronically to        [facility] # [id number]* (retail)       [address]       [place], [place]  [address]       Ph: [phone]       Fax: [phone]   RxID:   [id number]   Patient Instructions: 1)  wear sling and splint til ortho follow up. follow up with murphy wainrer tuesday or wensday as discussed with Dr. Jonelle Sports. apply ice intermitantly and elevate.  2)  Please schedule a follow-up appointment in 4 months.

## 2010-09-23 NOTE — Consult Note (Signed)
Summary: [facility]   Imported By: Edmonia James 10/22/2009 14:12:05  _____________________________________________________________________  External Attachment:    Type:   Image     Comment:   External Document

## 2010-09-23 NOTE — Letter (Signed)
Summary: [facility]   Imported By: Edmonia James 04/30/2010 08:56:44  _____________________________________________________________________  External Attachment:    Type:   Image     Comment:   External Document

## 2010-09-23 NOTE — Progress Notes (Signed)
  Phone Note Outgoing Call   Call placed by: Fredia Beets, RN,  November 08, 2009 4:11 PM Summary of Call: records from [facility] reviewed by dr Stanford Breed, pt called to schedule follow up 2D-echo for cardiomyopathy. pt refused at this time due to cost and insurance issues. dr Stanford Breed aware Fredia Beets, RN  November 08, 2009 4:13 PM

## 2010-09-23 NOTE — Letter (Signed)
[facility]   Imported By: Marilynne Drivers 11/21/2009 12:27:59  _____________________________________________________________________  External Attachment:    Type:   Image     Comment:   External Document

## 2010-09-23 NOTE — Assessment & Plan Note (Signed)
Summary: DM f/u   Vital Signs:  Patient profile:   60 year old female Height:      62.1 inches Weight:      205 pounds BMI:     37.51 O2 Sat:      97 % on Room air Pulse rate:   79 / minute BP sitting:   99 / 56  (left arm) Cuff size:   large  Vitals Entered By: Sherlean Foot CMA (Jan 09, 2010 10:32 AM)  O2 Flow:  Room air CC: DM f/u. Also c/o sinus infection x 1 week.   Primary Care Provider:  Loyal Gambler DO  CC:  DM f/u. Also c/o sinus infection x 1 week.[name]  History of Present Illness: 60 yo WF presents for f/u DM visit.   She is on Lantus 15 units/ day.  Her home sugar readings are in the 100s -130.  She is seeing Dr Sabra Heck for her neuropathy.  She has fair diet control.  She now has diabetic shoes.  She saw cardiology back and had her pacemaker checked.  Had a good exam.  She had a normal CXR and an Echo that had a normal EF with mild LVH.    She continue to complain of aches and pains all over.  She had a bout of binge eating last night.  She has a new dog and has started doing water aerobics and walking the dog.    Allergies: 1)  ! Quinine 2)  ! Sulfa 3)  ! Pcn 4)  ! Rifadin (Rifampin) 5)  ! Clindamycin 6)  ! Accupril 7)  ! * Baking Soda  Past History:  Past Medical History: Reviewed history from 10/23/2009 and no changes required. Asthma H/O PUD ESSENTIAL HYPERTENSION, BENIGN (ICD-401.1) CHF (ICD-428.0) LBBB ALKALINE PHOSPHATASE, ELEVATED (ICD-790.5) DIABETIC PERIPHERAL NEUROPATHY (ICD-250.60) UNSPECIFIED HYPOTHYROIDISM (ICD-244.9) CLOSED FRACTURE OF UNSPECIFIED PART OF RADIUS (ICD-813.81) DEPRESSION (ICD-311) BACK PAIN, CHRONIC (ICD-724.5) ALLERGIC RHINITIS CAUSE UNSPECIFIED (ICD-477.9) DM (ICD-250.00) anxiety/ depression Lumbar DDD, chronic pain, Dr Sabra Heck at [facility] Neurologic carpal tunnel IBS  Past Surgical History: Reviewed history from 10/23/2009 and no changes required. pacemaker 2009 [facility] knee and foot  surgery hysterectomy at 25 for bleeding Tubal ligation pen reduction and internal fixation, right distal  radius fracture, 2 pieces, with carpal tunnel release.   Social History: Reviewed history from 10/23/2009 and no changes required. newly divorced. Finished grade school only. Brother lives w/ her.  2 kids in [place] - a son and a daughter. Quit smoking in 1969. Walks 2 days/ wk. Alcohol Use - no  Review of Systems      See HPI  Physical Exam  General:  short statured WF in NAD.  obese Head:  normocephalic and atraumatic.   Eyes:  pupils equal, pupils round, and pupils reactive to light.   Mouth:  pharynx pink and moist and poor dentition.   Neck:  no masses.   Lungs:  normal respiratory effort, no intercostal retractions, no accessory muscle use, normal breath sounds, no crackles, and no wheezes.   Heart:  normal rate, regular rhythm, and no murmur.   Extremities:  no LE edema Skin:  color normal.   Psych:  good eye contact, not anxious appearing, and flat affect.     Impression & Recommendations:  Problem # 1:  DM (ICD-250.00) Good control with A1C of 6.2.  Continue current meds.  Needs to keep working on improving diet (h/o given), exercise (just started ) and wt loss.  BMI 37  c/w class II obesity.  Her updated medication list for this problem includes:    Losartan Potassium 50 Mg Tabs (Losartan potassium) .[name]... 1 tab by mouth bid    Lantus 100 Unit/ml Soln (Insulin glargine) ..... Inject 15 units in the morning  Orders: Fingerstick (36416) Hemoglobin A1C (83036)  Labs Reviewed: Creat: 0.66 (10/10/2009)   Microalbumin: 10 (07/02/2009)  Last Eye Exam: no retinopathy (Dr Laban Emperor) (06/13/2009) Reviewed HgBA1c results: 6.2 (01/09/2010)  6.2 (10/03/2009)  Problem # 2:  HYPERLIPIDEMIA (ICD-272.4) Labs UTD.  Copy given to pt today.  I reviewed all of her recent cardiology notes. Her updated medication list for this problem includes:    Pravastatin Sodium 40 Mg Tabs  (Pravastatin sodium) .[name]... 1 tab by mouth qhs  Labs Reviewed: SGOT: 21 (10/10/2009)   SGPT: 35 (10/10/2009)   HDL:55 (10/10/2009)  LDL:113 (10/10/2009)  Chol:194 (10/10/2009)  Trig:130 (10/10/2009)  Problem # 3:  ESSENTIAL HYPERTENSION, BENIGN (ICD-401.1) Cut Losartan to 50 mg two times a day due to fatigue and fairly low reading today. Her updated medication list for this problem includes:    Losartan Potassium 50 Mg Tabs (Losartan potassium) .[name]... 1 tab by mouth bid    Carvedilol 6.25 Mg Tabs (Carvedilol) .[name]... Take one tablet by mouth twice a day    Bumetanide 1 Mg Tabs (Bumetanide) .[name]... Take 1 tablet by mouth two times a day  BP today: 99/56 Prior BP: 110/60 (12/05/2009)  Labs Reviewed: K+: 4.4 (10/10/2009) Creat: : 0.66 (10/10/2009)   Chol: 194 (10/10/2009)   HDL: 55 (10/10/2009)   LDL: 113 (10/10/2009)   TG: 130 (10/10/2009)  Problem # 4:  UNSPECIFIED HYPOTHYROIDISM (ICD-244.9) TSH UTD.  Continue Levothyroxine.  rEpeat TSH in 4 mos. Her updated medication list for this problem includes:    Levothyroxine Sodium 50 Mcg Tabs (Levothyroxine sodium) .[name]... Take one tablet by mouth once aday  Labs Reviewed: TSH: 1.424 (10/10/2009)    HgBA1c: 6.2 (01/09/2010) Chol: 194 (10/10/2009)   HDL: 55 (10/10/2009)   LDL: 113 (10/10/2009)   TG: 130 (10/10/2009)  Complete Medication List: 1)  Losartan Potassium 50 Mg Tabs (Losartan potassium) .[name].. 1 tab by mouth bid 2)  Lantus 100 Unit/ml Soln (Insulin glargine) .... Inject 15 units in the morning 3)  B-100 Complex Tabs (Vitamins-lipotropics) .... Take one tablet by mouth once a day 4)  Glucosamine-chondroitin 250-200 Mg Caps (Glucosamine-chondroitin) .... Take one tablet by mouth twice ad ay 5)  Arthritis Pain Relief 650 Mg Cr-tabs (Acetaminophen) .... Take two tablets by mouth once a day 6)  Cymbalta 60 Mg Cpep (Duloxetine hcl) .... Take one tablet by mouth once ad ay 7)  Levothyroxine Sodium 50 Mcg Tabs (Levothyroxine sodium) .... Take one  tablet by mouth once aday 8)  Isosorbide Dinitrate 30 Mg Tabs (Isosorbide dinitrate) .... Take one tablet by mouth once ad ay 9)  Carvedilol 6.25 Mg Tabs (Carvedilol) .... Take one tablet by mouth twice a day 10)  Gabapentin 600 Mg Tabs (Gabapentin) .... Take 1 tab by mouth three times a day 11)  Bumetanide 1 Mg Tabs (Bumetanide) .... Take 1 tablet by mouth two times a day 12)  Multivitamins Tabs (Multiple vitamin) .... Take one tablet by mouth once ad ay 13)  Mucinex 600 Mg Xr12h-tab (Guaifenesin) .... Take one tablet by mouth once ad ay 14)  Calcium 600 1500 Mg Tabs (Calcium carbonate) .... 2 tablets once a day 15)  Advair Diskus 250-50 Mcg/dose Aepb (Fluticasone-salmeterol) .... Use one puff twice a day 16)  Bd Insulin Syringe 27.5g X 5/8" 2 Ml Misc (Insulin syringe-needle u-100) .... Use as directed 17)  Freestyle Lite Test Strp (Glucose blood) .... Use twice daily as directed 18)  Nexium 40 Mg Cpdr (Esomeprazole magnesium) .... Take 1 capsule by mouth once a day 19)  Singulair 10 Mg Tabs (Montelukast sodium) .... Take 1 tablet by mouth once a day 20)  Restasis 0.05 % Emul (Cyclosporine) .... One drop two times a day 21)  Tizanidine Hcl 4 Mg Tabs (Tizanidine hcl) .... 2 tablets two times a day 22)  Proair Hfa 108 (90 Base) Mcg/act Aers (Albuterol sulfate) .... As needed 23)  Accu-chek Soft Touch Lancets Misc (Lancets) .... Use twice daily as directed 24)  Pravastatin Sodium 40 Mg Tabs (Pravastatin sodium) .[name].. 1 tab by mouth qhs 25)  Fexofenadine Hcl 180 Mg Tabs (Fexofenadine hcl) .... Take one tablet by mouth once daily. 26)  Glucosamine-chondroitin Caps (Glucosamine-chondroit-vit c-mn) .... Take one tablet by mouth twice daily.  Other Orders: T-Vitamin D (25-Hydroxy) [phone]) T-Vitamin B12 [phone]) T-Iron [phone]) T-Iron Binding Capacity (TIBC) ([id number])  Patient Instructions: 1)  Change losartan to 50 mg twice a day. 2)  A1C great <7. 3)  Check labs downstairs  today. 4)  will call you w/ results tomorrow. 5)  Keep working on diabetic diet, regular exercise, wt loss. 6)  Return for a PHYSICAL in 3 mos. Prescriptions: LOSARTAN POTASSIUM 50 MG TABS (LOSARTAN POTASSIUM) 1 tab by mouth bid  #60 x 3   Entered and Authorized by:   Loyal Gambler DO   Signed by:   Loyal Gambler DO on 01/09/2010   Method used:   Electronically to        [facility] # [phone]* (retail)       [address]       [place], [place]  [address]       Ph: [phone]       Fax: [phone]   RxID:   [id number]   Laboratory Results   Blood Tests     HGBA1C: 6.2%   (Normal Range: Non-Diabetic - 3-6%   Control Diabetic - 6-8%)

## 2010-09-23 NOTE — Assessment & Plan Note (Signed)
Summary: rhus dermatitis   Vital Signs:  Patient profile:   60 year old female Height:      62.1 inches Weight:      204 pounds BMI:     37.33 O2 Sat:      94 % on Room air Pulse rate:   73 / minute BP sitting:   103 / 60  (left arm) Cuff size:   large  Vitals Entered By: Sherlean Foot CMA (January 22, 2010 9:36 AM)  O2 Flow:  Room air CC: Rash on B arms and on upper R thigh x 2.5 weeks.   Primary Care Provider:  Loyal Gambler DO  CC:  Rash on B arms and on upper R thigh x 2.5 weeks.[name]  History of Present Illness: 60 yo WF presents for poison oak on the arms and R thigh x 2 wks.  She thinks that her dog brought it in from the yard.  The rash is itchy.  She feels well o/w other than being tired.  She tried oral benadyl and topical anti fungal but this did not help.    Current Medications (verified): 1)  Losartan Potassium 50 Mg Tabs (Losartan Potassium) .[name].. 1 Tab By Mouth Bid 2)  Lantus 100 Unit/ml Soln (Insulin Glargine) .... Inject 15 Units in The Morning 3)  B-100 Complex  Tabs (Vitamins-Lipotropics) .... Take One Tablet By Mouth Once A Day 4)  Glucosamine-Chondroitin 250-200 Mg Caps (Glucosamine-Chondroitin) .... Take One Tablet By Mouth Twice Ad Ay 5)  Arthritis Pain Relief 650 Mg Cr-Tabs (Acetaminophen) .... Take Two Tablets By Mouth Once A Day 6)  Cymbalta 60 Mg Cpep (Duloxetine Hcl) .... Take One Tablet By Mouth Once Ad Ay 7)  Levothyroxine Sodium 50 Mcg Tabs (Levothyroxine Sodium) .... Take One Tablet By Mouth Once Aday 8)  Isosorbide Dinitrate 30 Mg Tabs (Isosorbide Dinitrate) .... Take One Tablet By Mouth Once Ad Ay 9)  Carvedilol 6.25 Mg Tabs (Carvedilol) .... Take One Tablet By Mouth Twice A Day 10)  Gabapentin 600 Mg Tabs (Gabapentin) .... Take 1 Tab By Mouth Three Times A Day 11)  Bumetanide 1 Mg Tabs (Bumetanide) .... Take 1 Tablet By Mouth Two Times A Day 12)  Multivitamins  Tabs (Multiple Vitamin) .... Take One Tablet By Mouth Once Ad Ay 13)  Mucinex 600 Mg  Xr12h-Tab (Guaifenesin) .... Take One Tablet By Mouth Once Ad Ay 14)  Calcium 600 1500 Mg Tabs (Calcium Carbonate) .... 2 Tablets Once A Day 15)  Advair Diskus 250-50 Mcg/dose Aepb (Fluticasone-Salmeterol) .... Use One Puff Twice A Day 16)  Bd Insulin Syringe 27.5g X 5/8" 2 Ml Misc (Insulin Syringe-Needle U-100) .... Use As Directed 17)  Freestyle Lite Test  Strp (Glucose Blood) .... Use Twice Daily As Directed 18)  Nexium 40 Mg Cpdr (Esomeprazole Magnesium) .... Take 1 Capsule By Mouth Once A Day 19)  Singulair 10 Mg Tabs (Montelukast Sodium) .... Take 1 Tablet By Mouth Once A Day 20)  Restasis 0.05 % Emul (Cyclosporine) .... One Drop Two Times A Day 21)  Tizanidine Hcl 4 Mg Tabs (Tizanidine Hcl) .... 2 Tablets Two Times A Day 22)  Proair Hfa 108 (90 Base) Mcg/act Aers (Albuterol Sulfate) .... As Needed 23)  Accu-Chek Soft Touch Lancets  Misc (Lancets) .... Use Twice Daily As Directed 24)  Pravastatin Sodium 40 Mg Tabs (Pravastatin Sodium) .[name].. 1 Tab By Mouth Qhs 25)  Fexofenadine Hcl 180 Mg Tabs (Fexofenadine Hcl) .... Take One Tablet By Mouth Once Daily. 26)  Glucosamine-Chondroitin   Caps (Glucosamine-Chondroit-Vit C-Mn) .... Take One Tablet By Mouth Twice Daily.  Allergies (verified): 1)  ! Quinine 2)  ! Sulfa 3)  ! Pcn 4)  ! Rifadin (Rifampin) 5)  ! Clindamycin 6)  ! Accupril 7)  ! * Baking Soda  Past History:  Past Medical History: Reviewed history from 10/23/2009 and no changes required. Asthma H/O PUD ESSENTIAL HYPERTENSION, BENIGN (ICD-401.1) CHF (ICD-428.0) LBBB ALKALINE PHOSPHATASE, ELEVATED (ICD-790.5) DIABETIC PERIPHERAL NEUROPATHY (ICD-250.60) UNSPECIFIED HYPOTHYROIDISM (ICD-244.9) CLOSED FRACTURE OF UNSPECIFIED PART OF RADIUS (ICD-813.81) DEPRESSION (ICD-311) BACK PAIN, CHRONIC (ICD-724.5) ALLERGIC RHINITIS CAUSE UNSPECIFIED (ICD-477.9) DM (ICD-250.00) anxiety/ depression Lumbar DDD, chronic pain, Dr Sabra Heck at [facility] Neurologic carpal tunnel IBS  Social  History: Reviewed history from 10/23/2009 and no changes required. newly divorced. Finished grade school only. Brother lives w/ her.  2 kids in [place] - a son and a daughter. Quit smoking in 1969. Walks 2 days/ wk. Alcohol Use - no  Review of Systems      See HPI  Physical Exam  General:  alert, well-developed, well-nourished, and well-hydrated.  obese Skin:  erythematous tiny vesicular rash on both forearms and proximal R thigh.   Psych:  good eye contact, not anxious appearing, and not depressed appearing.     Impression & Recommendations:  Problem # 1:  RHUS DERMATITIS (AJG-811.6) localized rhus dermatitis after 2 wks in a diabetic.  Will treat with topical steroid cream, moderate potency x 7 days.  Control pruritis with Benadryl 25 mg 4 x a day for the next 4 days.  Call if not improved in 7 days. Her updated medication list for this problem includes:    Fexofenadine Hcl 180 Mg Tabs (Fexofenadine hcl) .[name]... Take one tablet by mouth once daily.    Triamcinolone Acetonide 0.5 % Crea (Triamcinolone acetonide) .[name]... Apply to rash three times a day x 7 days  Complete Medication List: 1)  Losartan Potassium 50 Mg Tabs (Losartan potassium) .[name].. 1 tab by mouth bid 2)  Lantus 100 Unit/ml Soln (Insulin glargine) .... Inject 15 units in the morning 3)  B-100 Complex Tabs (Vitamins-lipotropics) .... Take one tablet by mouth once a day 4)  Glucosamine-chondroitin 250-200 Mg Caps (Glucosamine-chondroitin) .... Take one tablet by mouth twice ad ay 5)  Arthritis Pain Relief 650 Mg Cr-tabs (Acetaminophen) .... Take two tablets by mouth once a day 6)  Cymbalta 60 Mg Cpep (Duloxetine hcl) .... Take one tablet by mouth once ad ay 7)  Levothyroxine Sodium 50 Mcg Tabs (Levothyroxine sodium) .... Take one tablet by mouth once aday 8)  Isosorbide Dinitrate 30 Mg Tabs (Isosorbide dinitrate) .... Take one tablet by mouth once ad ay 9)  Carvedilol 6.25 Mg Tabs (Carvedilol) .... Take one tablet by mouth  twice a day 10)  Gabapentin 600 Mg Tabs (Gabapentin) .... Take 1 tab by mouth three times a day 11)  Bumetanide 1 Mg Tabs (Bumetanide) .... Take 1 tablet by mouth two times a day 12)  Multivitamins Tabs (Multiple vitamin) .... Take one tablet by mouth once ad ay 13)  Mucinex 600 Mg Xr12h-tab (Guaifenesin) .... Take one tablet by mouth once ad ay 14)  Calcium 600 1500 Mg Tabs (Calcium carbonate) .... 2 tablets once a day 15)  Advair Diskus 250-50 Mcg/dose Aepb (Fluticasone-salmeterol) .... Use one puff twice a day 16)  Bd Insulin Syringe 27.5g X 5/8" 2 Ml Misc (Insulin syringe-needle u-100) .... Use as directed 17)  Freestyle Lite Test Strp (Glucose blood) .... Use twice daily as  directed 18)  Nexium 40 Mg Cpdr (Esomeprazole magnesium) .... Take 1 capsule by mouth once a day 19)  Singulair 10 Mg Tabs (Montelukast sodium) .... Take 1 tablet by mouth once a day 20)  Restasis 0.05 % Emul (Cyclosporine) .... One drop two times a day 21)  Tizanidine Hcl 4 Mg Tabs (Tizanidine hcl) .... 2 tablets two times a day 22)  Proair Hfa 108 (90 Base) Mcg/act Aers (Albuterol sulfate) .... As needed 23)  Accu-chek Soft Touch Lancets Misc (Lancets) .... Use twice daily as directed 24)  Pravastatin Sodium 40 Mg Tabs (Pravastatin sodium) .[name].. 1 tab by mouth qhs 25)  Fexofenadine Hcl 180 Mg Tabs (Fexofenadine hcl) .... Take one tablet by mouth once daily. 26)  Glucosamine-chondroitin Caps (Glucosamine-chondroit-vit c-mn) .... Take one tablet by mouth twice daily. 27)  Triamcinolone Acetonide 0.5 % Crea (Triamcinolone acetonide) .... Apply to rash three times a day x 7 days  Patient Instructions: 1)  Take lukewarm shower and wash with soap daily. 2)  Cover rash with triamcinolone cream 3 x a day for 7 days. 3)  Use Benadryl 25 mg every 6 hrs for itching for the next 4 days. 4)  This may make you sleepy. 5)  Call if not improved in 7 days. Prescriptions: TRIAMCINOLONE ACETONIDE 0.5 % CREA (TRIAMCINOLONE ACETONIDE)  apply to rash three times a day x 7 days  #15 g x 0   Entered by:   Sherlean Foot CMA   Authorized by:   Loyal Gambler DO   Signed by:   Sherlean Foot CMA on 01/22/2010   Method used:   Electronically to        [facility] # [phone]* (retail)       [address], [place]  [address]       Ph: [phone]       Fax: [phone]   RxID:   [id number] TRIAMCINOLONE ACETONIDE 0.5 % CREA (TRIAMCINOLONE ACETONIDE) apply to rash three times a day x 7 days  #15 g x 0   Entered and Authorized by:   Loyal Gambler DO   Signed by:   Loyal Gambler DO on 01/22/2010   Method used:   Electronically to        [facility] # [phone]* (retail)       [address]       [place], [place]  [address]       Ph: [phone]       Fax: [phone]   RxID:   [id number]

## 2010-09-23 NOTE — Letter (Signed)
Summary: [facility]  [facility]   Imported By: Edmonia James 10/22/2009 13:57:56  _____________________________________________________________________  External Attachment:    Type:   Image     Comment:   External Document

## 2010-09-23 NOTE — Assessment & Plan Note (Signed)
Summary: [facility]   Visit Type:  Initial Consult Primary Provider:  Loyal Gambler DO  CC:  CHF.  History of Present Illness: 60 year old female with past medical history of congestive heart failure to establish. The patient has previously been followed in [facility]. However she is changing due to insurance issues. She has a history of heart failure. We do not have records available at this time. She also has a history of recurrent syncope and has had a pacemaker placed. She has a history of a left bundle branch block. She has been treated with medications which apparently has controlled her congestive heart failure. I have no data concerning previous LV function. She apparently has had 2 cardiac catheterizations showing no coronary disease. She has dyspnea with more extreme activities but not with routine activities. There is no orthopnea, PND, pedal edema, palpitations, syncope or chest pain.  Current Medications (verified): 1)  Losartan Potassium 100 Mg Tabs (Losartan Potassium) .[name].. 1 Tab By Mouth Daily 2)  Lantus 100 Unit/ml Soln (Insulin Glargine) .... Inject 16 Units in The Morning 3)  B-100 Complex  Tabs (Vitamins-Lipotropics) .... Take One Tablet By Mouth Once A Day 4)  Glucosamine-Chondroitin 250-200 Mg Caps (Glucosamine-Chondroitin) .... Take One Tablet By Mouth Twice Ad Ay 5)  Arthritis Pain Relief 650 Mg Cr-Tabs (Acetaminophen) .... Take Two Tablets By Mouth Once A Day 6)  Cymbalta 60 Mg Cpep (Duloxetine Hcl) .... Take One Tablet By Mouth Once Ad Ay 7)  Levothyroxine Sodium 50 Mcg Tabs (Levothyroxine Sodium) .... Take One Tablet By Mouth Once Aday 8)  Isosorbide Dinitrate 30 Mg Tabs (Isosorbide Dinitrate) .... Take One Tablet By Mouth Once Ad Ay 9)  Carvedilol 6.25 Mg Tabs (Carvedilol) .... Take One Tablet By Mouth Twice A Day 10)  Gabapentin 300 Mg Caps (Gabapentin) .... 2 Tablets Three Times A Day 11)  Bumetanide 1 Mg Tabs (Bumetanide) .... Take 1 Tablet By Mouth Two  Times A Day 12)  Multivitamins  Tabs (Multiple Vitamin) .... Take One Tablet By Mouth Once Ad Ay 13)  Mucinex 600 Mg Xr12h-Tab (Guaifenesin) .... Take One Tablet By Mouth Once Ad Ay 14)  Calcium 600 1500 Mg Tabs (Calcium Carbonate) .... 2 Tablets Once A Day 15)  Advair Diskus 250-50 Mcg/dose Aepb (Fluticasone-Salmeterol) .... Use One Puff Twice A Day 16)  Xyzal 5 Mg Tabs (Levocetirizine Dihydrochloride) .[name].. 1 Tab By Mouth Daily 17)  Bd Insulin Syringe 27.5g X 5/8" 2 Ml Misc (Insulin Syringe-Needle U-100) .... Use As Directed 18)  Freestyle Lite Test  Strp (Glucose Blood) .... Use Twice Daily As Directed 19)  Tramadol Hcl 50 Mg Tabs (Tramadol Hcl) .[name].. 1 By Mouth Q 6 Hrs As Needed Pain 20)  Diabetic Shoes .... Dx: Diabetic Neuropathy 21)  Lantus 100 Unit/ml Soln (Insulin Glargine) .... As Directed 22)  Nexium 40 Mg Cpdr (Esomeprazole Magnesium) .... Take 1 Capsule By Mouth Once A Day 23)  Singulair 10 Mg Tabs (Montelukast Sodium) .... Take 1 Tablet By Mouth Once A Day 24)  Restasis 0.05 % Emul (Cyclosporine) .... One Drop Two Times A Day 25)  Tizanidine Hcl 4 Mg Tabs (Tizanidine Hcl) .... 2 Tablets Two Times A Day 26)  Proair Hfa 108 (90 Base) Mcg/act Aers (Albuterol Sulfate) .... As Needed  Allergies: 1)  ! Quinine 2)  ! Sulfa 3)  ! Pcn 4)  ! Rifadin (Rifampin) 5)  ! Clindamycin 6)  ! Accupril 7)  ! * Baking Soda  Past History:  Past Medical History:  Asthma H/O PUD ESSENTIAL HYPERTENSION, BENIGN (ICD-401.1) CHF (ICD-428.0) LBBB ALKALINE PHOSPHATASE, ELEVATED (ICD-790.5) DIABETIC PERIPHERAL NEUROPATHY (ICD-250.60) UNSPECIFIED HYPOTHYROIDISM (ICD-244.9) CLOSED FRACTURE OF UNSPECIFIED PART OF RADIUS (ICD-813.81) DEPRESSION (ICD-311) BACK PAIN, CHRONIC (ICD-724.5) ALLERGIC RHINITIS CAUSE UNSPECIFIED (ICD-477.9) DM (ICD-250.00) anxiety/ depression Lumbar DDD, chronic pain, Dr Sabra Heck at [facility] Neurologic carpal tunnel IBS  Past Surgical History: pacemaker 2009 [facility] hysterectomy at 29 for bleeding Tubal ligation pen reduction and internal fixation, right distal  radius fracture, 2 pieces, with carpal tunnel release.   Family History: Reviewed history from 04/26/2009 and no changes required. brother CHF sister ulcer mother HTN, died of cancer, melanoma, parkinsons dz father HTN, died in his 9s from CHF and pacemaker  Social History: Reviewed history from 04/26/2009 and no changes required. newly divorced. Finished grade school only. Brother lives w/ her.  2 kids in [place] - a son and a daughter. Quit smoking in 1969. Walks 2 days/ wk. Alcohol Use - no  Review of Systems       Problems with sinusitis but no fevers or chills, productive cough, hemoptysis, dysphasia, odynophagia, melena, hematochezia, dysuria, hematuria, rash, seizure activity, orthopnea, PND, pedal edema, claudication. Remaining systems are negative.   Vital Signs:  Patient profile:   60 year old female Height:      62.1 inches Weight:      199.50 pounds BMI:     36.50 Pulse rate:   84 / minute Pulse rhythm:   regular Resp:     18 per minute BP sitting:   138 / 84  (right arm) Cuff size:   large  Vitals Entered By: Sidney Ace (October 23, 2009 3:42 PM)  Physical Exam  General:  Well developed/well nourished in NAD Skin warm/dry Patient not depressed No peripheral clubbing Back-normal HEENT-normal/normal eyelids Neck supple/normal carotid upstroke bilaterally; no bruits; no JVD; no thyromegaly chest - CTA/ normal expansion; pacemaker and left chest area. CV - RRR/normal S1 and S2; no murmurs, rubs or gallops;  PMI nondisplaced Abdomen -NT/ND, no HSM, no mass, + bowel sounds, no bruit 2+ femoral pulses, no bruits Ext-no edema, chords, 2+ DP; right upper extremity immobilized from recent fracture. Neuro-grossly nonfocal     EKG  Procedure date:  10/23/2009  Findings:      Sinus rhythm with ventricular pacing.  Impression  & Recommendations:  Problem # 1:  CHF (ICD-428.0) Patient has a history of congestive heart failure. I will obtain all of her records from [facility]. We have no information concerning prior LV function. However she is not having significant symptoms and euvolemic on examination. Continue ARB, beta blocker and diuretics. Primary care following her renal function and potassium. The following medications were removed from the medication list:    Dilacor Xr 120 Mg Xr24h-cap (Diltiazem hcl) .[name]... Take one tablet by mouth twice ad ay Her updated medication list for this problem includes:    Losartan Potassium 100 Mg Tabs (Losartan potassium) .[name]... 1 tab by mouth daily    Isosorbide Dinitrate 30 Mg Tabs (Isosorbide dinitrate) .[name]... Take one tablet by mouth once ad ay    Carvedilol 6.25 Mg Tabs (Carvedilol) .[name]... Take one tablet by mouth twice a day    Bumetanide 1 Mg Tabs (Bumetanide) .[name]... Take 1 tablet by mouth two times a day  Problem # 2:  ESSENTIAL HYPERTENSION, BENIGN (ICD-401.1) Blood pressure controlled on present medications.Will continue. The following medications were removed from the medication list:    Dilacor Xr 120  Mg Xr24h-cap (Diltiazem hcl) .[name]... Take one tablet by mouth twice ad ay Her updated medication list for this problem includes:    Losartan Potassium 100 Mg Tabs (Losartan potassium) .[name]... 1 tab by mouth daily    Carvedilol 6.25 Mg Tabs (Carvedilol) .[name]... Take one tablet by mouth twice a day    Bumetanide 1 Mg Tabs (Bumetanide) .[name]... Take 1 tablet by mouth two times a day  Problem # 3:  PACEMAKER, PERMANENT (ICD-V45.01) Patient referred to [facility] to follow this issue.  Problem # 4:  UNSPECIFIED HYPOTHYROIDISM (ICD-244.9)  Her updated medication list for this problem includes:    Levothyroxine Sodium 50 Mcg Tabs (Levothyroxine sodium) .[name]... Take one tablet by mouth once aday  Problem # 5:  DM (ICD-250.00) Management per primary care. Her updated medication list for this  problem includes:    Losartan Potassium 100 Mg Tabs (Losartan potassium) .[name]... 1 tab by mouth daily    Lantus 100 Unit/ml Soln (Insulin glargine) ..... Inject 16 units in the morning    Lantus 100 Unit/ml Soln (Insulin glargine) .[name]... As directed  Problem # 6:  ALKALINE PHOSPHATASE, ELEVATED (ICD-790.5) Evaluation ongoing per primary care.  Problem # 7:  LEFT BUNDLE BRANCH BLOCK (ICD-426.3)  The following medications were removed from the medication list:    Dilacor Xr 120 Mg Xr24h-cap (Diltiazem hcl) .[name]... Take one tablet by mouth twice ad ay Her updated medication list for this problem includes:    Isosorbide Dinitrate 30 Mg Tabs (Isosorbide dinitrate) .[name]... Take one tablet by mouth once ad ay    Carvedilol 6.25 Mg Tabs (Carvedilol) .[name]... Take one tablet by mouth twice a day  Patient Instructions: 1)  Your physician recommends that you schedule a follow-up appointment in: dr Stanford Breed in 6 months

## 2010-09-23 NOTE — Assessment & Plan Note (Signed)
Summary: ec6/to establish/medtronic device/dm   Visit Type:  Follow-up Primary Provider:  Loyal Gambler DO   History of Present Illness: Tanya Hogan is a 60 year old female with past medical history of congestive heart failure. The patient has previously been followed in [facility]. However she is changing due to insurance issues. She also has a history of recurrent syncope and has had a pacemaker placed. She has a history of a left bundle branch block. She has been treated with medications which apparently has controlled her congestive heart failure.  She had a BiV PPM replaced back in September 2010.  There is no orthopnea, PND, pedal edema, palpitations, syncope or chest pain. She is referred by Dr. Stanford Breed for ongoing PPM followup.  Current Medications (verified): 1)  Losartan Potassium 100 Mg Tabs (Losartan Potassium) .... Take One Tablet By Mouth Once Daily. 2)  Lantus 100 Unit/ml Soln (Insulin Glargine) .... Inject 15 Units in The Morning 3)  B-100 Complex  Tabs (Vitamins-Lipotropics) .... Take One Tablet By Mouth Once A Day 4)  Glucosamine-Chondroitin 250-200 Mg Caps (Glucosamine-Chondroitin) .... Take One Tablet By Mouth Twice Ad Ay 5)  Arthritis Pain Relief 650 Mg Cr-Tabs (Acetaminophen) .... Take Two Tablets By Mouth Once A Day 6)  Cymbalta 60 Mg Cpep (Duloxetine Hcl) .... Take One Tablet By Mouth Once Ad Ay 7)  Levothyroxine Sodium 50 Mcg Tabs (Levothyroxine Sodium) .... Take One Tablet By Mouth Once Aday 8)  Isosorbide Dinitrate 30 Mg Tabs (Isosorbide Dinitrate) .... Take One Tablet By Mouth Once Ad Ay 9)  Carvedilol 6.25 Mg Tabs (Carvedilol) .... Take One Tablet By Mouth Twice A Day 10)  Gabapentin 300 Mg Caps (Gabapentin) .... 2 Tablets Three Times A Day 11)  Bumetanide 1 Mg Tabs (Bumetanide) .... Take 1 Tablet By Mouth Two Times A Day 12)  Multivitamins  Tabs (Multiple Vitamin) .... Take One Tablet By Mouth Once Ad Ay 13)  Mucinex 600 Mg Xr12h-Tab (Guaifenesin) .... Take One  Tablet By Mouth Once Ad Ay 14)  Calcium 600 1500 Mg Tabs (Calcium Carbonate) .... 2 Tablets Once A Day 15)  Advair Diskus 250-50 Mcg/dose Aepb (Fluticasone-Salmeterol) .... Use One Puff Twice A Day 16)  Bd Insulin Syringe 27.5g X 5/8" 2 Ml Misc (Insulin Syringe-Needle U-100) .... Use As Directed 17)  Freestyle Lite Test  Strp (Glucose Blood) .... Use Twice Daily As Directed 18)  Nexium 40 Mg Cpdr (Esomeprazole Magnesium) .... Take 1 Capsule By Mouth Once A Day 19)  Singulair 10 Mg Tabs (Montelukast Sodium) .... Take 1 Tablet By Mouth Once A Day 20)  Restasis 0.05 % Emul (Cyclosporine) .... One Drop Two Times A Day 21)  Tizanidine Hcl 4 Mg Tabs (Tizanidine Hcl) .... 2 Tablets Two Times A Day 22)  Proair Hfa 108 (90 Base) Mcg/act Aers (Albuterol Sulfate) .... As Needed 23)  Accu-Chek Soft Touch Lancets  Misc (Lancets) .... Use Twice Daily As Directed 24)  Pravastatin Sodium 40 Mg Tabs (Pravastatin Sodium) .[name].. 1 Tab By Mouth Qhs 25)  Fexofenadine Hcl 180 Mg Tabs (Fexofenadine Hcl) .... Take One Tablet By Mouth Once Daily. 26)  Glucosamine-Chondroitin   Caps (Glucosamine-Chondroit-Vit C-Mn) .... Take One Tablet By Mouth Twice Daily.  Allergies (verified): 1)  ! Quinine 2)  ! Sulfa 3)  ! Pcn 4)  ! Rifadin (Rifampin) 5)  ! Clindamycin 6)  ! Accupril 7)  ! * Baking Soda  Past History:  Past Medical History: Last updated: 10/23/2009 Asthma H/O PUD ESSENTIAL HYPERTENSION, BENIGN (ICD-401.1) CHF (ICD-428.0)  LBBB ALKALINE PHOSPHATASE, ELEVATED (ICD-790.5) DIABETIC PERIPHERAL NEUROPATHY (ICD-250.60) UNSPECIFIED HYPOTHYROIDISM (ICD-244.9) CLOSED FRACTURE OF UNSPECIFIED PART OF RADIUS (ICD-813.81) DEPRESSION (ICD-311) BACK PAIN, CHRONIC (ICD-724.5) ALLERGIC RHINITIS CAUSE UNSPECIFIED (ICD-477.9) DM (ICD-250.00) anxiety/ depression Lumbar DDD, chronic pain, Dr Sabra Heck at [facility] carpal tunnel IBS  Past Surgical History: Last updated: 10/23/2009 pacemaker 2009 [facility] knee and foot surgery hysterectomy at 58 for bleeding Tubal ligation pen reduction and internal fixation, right distal  radius fracture, 2 pieces, with carpal tunnel release.   Family History: Last updated: 10/23/2009 brother CHF sister ulcer mother HTN, died of cancer, melanoma, parkinsons dz father HTN, died in his 26s from CHF and pacemaker  Social History: Last updated: 10/23/2009 newly divorced. Finished grade school only. Brother lives w/ her.  2 kids in [place] - a son and a daughter. Quit smoking in 1969. Walks 2 days/ wk. Alcohol Use - no  Review of Systems       All systems reviewed and negative except as noted in the HPI.  Vital Signs:  Patient profile:   60 year old female Height:      62.1 inches Weight:      198 pounds BMI:     36.23 Pulse rate:   81 / minute BP sitting:   110 / 60  (left arm)  Vitals Entered By: Margaretmary Bayley CMA (December 05, 2009 9:31 AM)  Physical Exam  General:  obese WF in NAD Head:  normocephalic and atraumatic.   Eyes:  PERRLA Mouth:  pharynx pink and moist.   Neck:  no masses.   Lungs:  normal respiratory effort, no intercostal retractions, no accessory muscle use, normal breath sounds, no crackles, and no wheezes.   Heart:  normal rate, regular rhythm, and no murmur.   Abdomen:  soft and non-tender.   Msk:  gait normal. R forearm in cast/ sling R foot tender along longitudinal arch and in between toes 2 and 3 and 4 over the dorusm but no edema, bruising or redness Pulses:  2+ pedal pulses Extremities:  no LE edema Neurologic:  grossly intact and non-focal   PPM Specifications Following MD:  Cristopher Peru, MD     Referring MD:  Conley Simmonds Vendor:  Medtronic     PPM Model Number:  [id number]     PPM Serial Number:  [id number] PPM DOI:  11/14/2001     PPM Implanting MD:  NOT IMPLANTED HERE  Lead 1    Location: RA     DOI: 11/14/2001     Model #: [id number]     Serial #: [id number]     Status: active Lead 2    Location: RV      DOI: 11/14/2001     Model #: [id number]     Serial #: [id number]     Status: active Lead 3    Location: LV     DOI: 11/14/2001     Model #: [id number]     Serial #: [id number]     Status: active   Indications:  CHF   PPM Follow Up Remote Check?  No Battery Voltage:  2.989 V     Battery Est. Longevity:  5 YEARS     Pacer Dependent:  Yes       PPM Device Measurements Atrium  Amplitude: 2.8 mV, Impedance: 616 ohms, Threshold: 0.5 V at 0.5 msec Right Ventricle  Amplitude: 20 mV, Impedance: 683 ohms, Threshold: 1.0 V at 0.46 msec Left Ventricle  Impedance: 1260 ohms, Threshold: 2.0  V at 0.6 msec Configuration: LV TIP TO RV RING  Episodes MS Episodes:  25     Percent Mode Switch:  0%     Coumadin:  No Ventricular High Rate:  0     Atrial Pacing:  16.5%     Ventricular Pacing:  99.6%  Parameters Mode:  DDD     Lower Rate Limit:  70     Upper Rate Limit:  140 Paced AV Delay:  140     Sensed AV Delay:  130 Next Cardiology Appt Due:  05/24/2010 Tech Comments:  Normal device function.  LV output changed to 2.5V at  0.76mec.  VS episodes are short runs of NSVT.  Pt very sensitive to testing.  ROV 6 months clinic. AChanetta MarshallRN BSN  December 05, 2009 10:26 AM  MD Comments:  Agree with above.  Impression & Recommendations:  Problem # 1:  PACEMAKER, PERMANENT (ICD-V45.01) Her BiV PPM is working normally.  Will recheck in several months. Orders: T-2 View CXR ([id number]  Problem # 2:  CHF (ICD-428.0) Her symptoms appear to be class 2.  Continue current meds and maintain a low sodium diet. Her updated medication list for this problem includes:    Losartan Potassium 100 Mg Tabs (Losartan potassium) ..[name].. Take one tablet by mouth once daily.    Isosorbide Dinitrate 30 Mg Tabs (Isosorbide dinitrate) ..[name].. Take one tablet by mouth once ad ay    Carvedilol 6.25 Mg Tabs (Carvedilol) ..[name].. Take one tablet by mouth twice a day    Bumetanide 1 Mg Tabs (Bumetanide) ..[name].. Take 1 tablet by mouth two times a  day  Orders: Echocardiogram (Echo)  Problem # 3:  ESSENTIAL HYPERTENSION, BENIGN (ICD-401.1) Her blood pressure is well controlled. A low sodium diet and his current meds have been recommended. Her updated medication list for this problem includes:    Losartan Potassium 100 Mg Tabs (Losartan potassium) ..[name].. Take one tablet by mouth once daily.    Carvedilol 6.25 Mg Tabs (Carvedilol) ..[name].. Take one tablet by mouth twice a day    Bumetanide 1 Mg Tabs (Bumetanide) ..[name].. Take 1 tablet by mouth two times a day  Patient Instructions: 1)  Your physician has requested that you have an echocardiogram.  Echocardiography is a painless test that uses sound waves to create images of your heart. It provides your doctor with information about the size and shape of your heart and how well your heart's chambers and valves are working.  This procedure takes approximately one hour. There are no restrictions for this procedure. we will call you with results 2)  Your physician wants you to follow-up in: 12 months   You will receive a reminder letter in the mail two months in advance. If you don't receive a letter, please call our office to schedule the follow-up appointment.

## 2010-09-23 NOTE — Progress Notes (Signed)
Summary: Mail order refills       New/Updated Medications: GABAPENTIN 600 MG TABS (GABAPENTIN) Take 1 tab by mouth three times a day Prescriptions: GABAPENTIN 600 MG TABS (GABAPENTIN) Take 1 tab by mouth three times a day  #270 x 1   Entered by:   Sherlean Foot CMA   Authorized by:   Loyal Gambler DO   Signed by:   Sherlean Foot CMA on 12/24/2009   Method used:   Faxed to ...       [facility] (mail-order)       [place], [place]  [id number]       Ph: [phone]       Fax: [phone]   RxID:   [id number] BUMETANIDE 1 MG TABS (BUMETANIDE) Take 1 tablet by mouth two times a day  #180 x 1   Entered by:   Sherlean Foot CMA   Authorized by:   Loyal Gambler DO   Signed by:   Sherlean Foot CMA on 12/24/2009   Method used:   Faxed to ...       [facility] (mail-order)       [address], [place]  [id number]       Ph: [phone]       Fax: [phone]   RxID:   [id number]

## 2010-09-23 NOTE — Cardiovascular Report (Signed)
Summary: Office Visit   Office Visit   Imported By: Sallee Provencal 12/10/2009 15:47:43  _____________________________________________________________________  External Attachment:    Type:   Image     Comment:   External Document

## 2010-09-23 NOTE — Letter (Signed)
Summary: [facility]   Imported By: Marilynne Drivers 11/21/2009 13:09:45  _____________________________________________________________________  External Attachment:    Type:   Image     Comment:   External Document

## 2010-09-23 NOTE — Letter (Signed)
Summary: Dr Okey Dupre Office Note   Dr Okey Dupre Office Note   Imported By: Sallee Provencal 06/09/2010 10:27:22  _____________________________________________________________________  External Attachment:    Type:   Image     Comment:   External Document

## 2010-09-23 NOTE — Letter (Signed)
Summary: [facility]   Imported By: Edmonia James 05/02/2010 12:18:01  _____________________________________________________________________  External Attachment:    Type:   Image     Comment:   External Document

## 2010-09-25 NOTE — Consult Note (Signed)
Summary: [facility]   Imported By: Edmonia James 09/12/2010 11:13:53  _____________________________________________________________________  External Attachment:    Type:   Image     Comment:   External Document

## 2010-09-25 NOTE — Assessment & Plan Note (Signed)
Summary: f/u DM   Vital Signs:  Patient profile:   60 year old female Height:      62.1 inches Weight:      207 pounds BMI:     37.88 O2 Sat:      96 % on Room air Pulse rate:   88 / minute BP sitting:   114 / 64  (left arm) Cuff size:   large  Vitals Entered By: Sherlean Foot CMA (August 26, 2010 11:01 AM)  O2 Flow:  Room air  CC: F/U DM.    Primary Care Provider:  Loyal Gambler DO  CC:  F/U DM. [name]  History of Present Illness: 60 yo WF presents for f/u T2DM with neuropathy.  She is using Lantus 15 units of Lantus in the morning.  She has lost 3 lbs and has lost inches per her report, walking more.    She is seeing Dr Humphrey Rolls and Dr Sondra Come for her recent L breast cancer.  She is s/p lumpectomy in August and did radiation.  She is ER, PR +, on Tamoxifen and doing well.  She recently had her eye exam and was told that she has some Macular Degeneration.  She recently saw her dentist.  She goes back for a crown this wk.  She is doing well on her meds. Fair control w/ diet.  AM fastings are running arond 120.  Denies any lows.  She did have some depressed mood and flare of her fibromyalgia pain in DEC, but both have improved.    Allergies: 1)  ! Quinine 2)  ! Sulfa 3)  ! Pcn 4)  ! Rifadin (Rifampin) 5)  ! Clindamycin 6)  ! Accupril 7)  ! * Baking Soda 8)  ! Codeine  Past History:  Past Medical History: Asthma H/O PUD ESSENTIAL HYPERTENSION, BENIGN (ICD-401.1) CHF (ICD-428.0) LBBB Fibromyalgia ALKALINE PHOSPHATASE, ELEVATED (ICD-790.5) DIABETIC PERIPHERAL NEUROPATHY (ICD-250.60) UNSPECIFIED HYPOTHYROIDISM (ICD-244.9) CLOSED FRACTURE OF UNSPECIFIED PART OF RADIUS (ICD-813.81) DEPRESSION (ICD-311) BACK PAIN, CHRONIC (ICD-724.5) ALLERGIC RHINITIS CAUSE UNSPECIFIED (ICD-477.9) DM (ICD-250.00) anxiety/ depression Lumbar DDD, chronic pain, Dr Sabra Heck at [facility] carpal tunnel IBS L breast cancer 03-2010  Past Surgical History: Reviewed history from  05/08/2010 and no changes required. pacemaker 2009 [facility] knee and foot surgery hysterectomy at 38 for bleeding Tubal ligation pen reduction and internal fixation, right distal  radius fracture, 2 pieces, with carpal tunnel release.  lumpectomy 04-2010, Dr Dalbert Batman for breast cancer  Family History: Reviewed history from 03/07/2010 and no changes required. brother CHF sister ulcer, breast cancer at 46 mother HTN, died of cancer, melanoma, parkinsons dz father HTN, died in his 53s from CHF and pacemaker  Social History: Reviewed history from 10/23/2009 and no changes required. newly divorced. Finished grade school only. Brother lives w/ her.  2 kids in [place] - a son and a daughter. Quit smoking in 1969. Walks 2 days/ wk. Alcohol Use - no  Review of Systems      See HPI  Physical Exam  General:  alert, well-developed, well-nourished, and well-hydrated.  obese Head:  normocephalic and atraumatic.   Eyes:  pupils equal, pupils round, and pupils reactive to light.  wears glasses (07-21-10 had eye exam) Mouth:  pharynx pink and moist.   Neck:  no masses.   Lungs:  Normal respiratory effort, chest expands symmetrically. Lungs are clear to auscultation, no crackles or wheezes. Heart:  normal rate, regular rhythm, and no murmur.   Extremities:  no LE edema Skin:  color normal.   Cervical Nodes:  No lymphadenopathy noted Psych:  good eye contact, not anxious appearing, and not depressed appearing.    Diabetes Management Exam:    Foot Exam (with socks and/or shoes not present):       Sensory-Pinprick/Light touch:          Left medial foot (L-4): normal          Left dorsal foot (L-5): normal          Left lateral foot (S-1): normal          Right medial foot (L-4): normal          Right dorsal foot (L-5): normal          Right lateral foot (S-1): normal       Sensory-Monofilament:          Left foot: normal          Right foot: normal       Inspection:          Left  foot: normal          Right foot: normal       Nails:          Left foot: normal          Right foot: normal   Impression & Recommendations:  Problem # 1:  DM (IRS-854.62) Complicated by neuropathy.  Doing well on Lantus alone.  A1C good at 6.2.  Continue current treatment along with a low sugar/ low carb diet, regular exercise and further wt loss.  Eye exam done 09-20-09, Umicro normal today. Her updated medication list for this problem includes:    Losartan Potassium 50 Mg Tabs (Losartan potassium) .[name]... 1 tab by mouth bid    Lantus Solostar 100 Unit/ml Soln (Insulin glargine) .Marland KitchenMarland KitchenMarland KitchenMarland Kitchen 15 units [place] injection qam  Orders: Fingerstick (70350) Hgb A1C (09381WE) Urine Microalbumin (99371)  Problem # 2:  INVASIVE DUCTAL CARCINOMA, LEFT BREAST (ICD-174.9) Reviewed her plan of care for L breast cancer, ER PR+, HER 2 neg s/p Lumpectomy and radiation on 04-22-10, seeing Dr Humphrey Rolls.  On Tamoxifen.  Problem # 3:  ESSENTIAL HYPERTENSION, BENIGN (ICD-401.1) BP perfect.  Labs due at next visit. Her updated medication list for this problem includes:    Losartan Potassium 50 Mg Tabs (Losartan potassium) .[name]... 1 tab by mouth bid    Carvedilol 6.25 Mg Tabs (Carvedilol) .[name]... Take one tablet by mouth twice a day    Bumetanide 1 Mg Tabs (Bumetanide) .[name]... Take 1 tablet by mouth two times a day    Diltiazem Hcl Er Beads 120 Mg Xr24h-cap (Diltiazem hcl er beads) .[name]... Take 1 capsule by mouth two times a day  BP today: 114/64 Prior BP: 120/69 (07/14/2010)  Labs Reviewed: K+: 4.4 (10/10/2009) Creat: : 0.66 (10/10/2009)   Chol: 194 (10/10/2009)   HDL: 55 (10/10/2009)   LDL: 113 (10/10/2009)   TG: 130 (10/10/2009)  Problem # 4:  HYPERLIPIDEMIA (ICD-272.4) Labs UTD.  Continue current meds. Her updated medication list for this problem includes:    Pravastatin Sodium 40 Mg Tabs (Pravastatin sodium) .[name]... 1 tab by mouth qhs  Labs Reviewed: SGOT: 21 (10/10/2009)   SGPT: 35 (10/10/2009)   HDL:55 (10/10/2009)   LDL:113 (10/10/2009)  Chol:194 (10/10/2009)  Trig:130 (10/10/2009)  Complete Medication List: 1)  Losartan Potassium 50 Mg Tabs (Losartan potassium) .[name].. 1 tab by mouth bid 2)  Lantus Solostar 100 Unit/ml Soln (Insulin glargine) .[name].. 15 units [place] injection qam 3)  Arthritis Pain Relief 650 Mg Cr-tabs (Acetaminophen) .... Take two tablets by mouth once a day 4)  Cymbalta 60 Mg Cpep (Duloxetine hcl) .... Take one tablet by mouth once ad ay 5)  Synthroid 50 Mcg Tabs (Levothyroxine sodium) .[name].. 1 tab by mouth daily 6)  Isosorbide Mononitrate Cr 30 Mg Xr24h-tab (Isosorbide mononitrate) .[name].. 1  tab once daily 7)  Carvedilol 6.25 Mg Tabs (Carvedilol) .... Take one tablet by mouth twice a day 8)  Gabapentin 600 Mg Tabs (Gabapentin) .... Take 2 tab by mouth three times a day 9)  Bumetanide 1 Mg Tabs (Bumetanide) .... Take 1 tablet by mouth two times a day 10)  Multivitamins Tabs (Multiple vitamin) .... Take one tablet by mouth once ad ay 11)  Calcium 600 1500 Mg Tabs (Calcium carbonate) .... 2 tablets once a day 12)  Advair Diskus 250-50 Mcg/dose Aepb (Fluticasone-salmeterol) .... Use one puff twice a day 13)  Bd Insulin Syringe 27.5g X 5/8" 2 Ml Misc (Insulin syringe-needle u-100) .... Use as directed 14)  Freestyle Lite Test Strp (Glucose blood) .... Use twice daily as directed 15)  Nexium 40 Mg Cpdr (Esomeprazole magnesium) .... Take 1 capsule by mouth once a day 16)  Singulair 10 Mg Tabs (Montelukast sodium) .... Take 1 tablet by mouth once a day 17)  Restasis 0.05 % Emul (Cyclosporine) .... One drop two times a day 18)  Tizanidine Hcl 4 Mg Tabs (Tizanidine hcl) .... Take 2 tablet by mouth two times a day 19)  Proair Hfa 108 (90 Base) Mcg/act Aers (Albuterol sulfate) .... As needed 20)  Accu-chek Soft Touch Lancets Misc (Lancets) .... Use twice daily as directed 21)  Pravastatin Sodium 40 Mg Tabs (Pravastatin sodium) .[name].. 1 tab by mouth qhs 22)  Allegra 180 Mg Tabs (Fexofenadine hcl) .[name].. 1 tab by  mouth daily 23)  Hyoscyamine Sulfate 0.125 Mg Tabs (Hyoscyamine sulfate) .... As needed 24)  Megared Capsule  .... Take 1 capsule by mouth once a day 25)  Diltiazem Hcl Er Beads 120 Mg Xr24h-cap (Diltiazem hcl er beads) .... Take 1 capsule by mouth two times a day 26)  Bd Pen Needle Ultrafine 29g X 12.46m Misc (Insulin pen needle) .... Use daily as directed 27)  Orphenadrine Citrate Cr 100 Mg Xr12h-tab (Orphenadrine citrate) .... Take one tablet by mouth twice a day 28)  Tamoxifen Citrate 20 Mg Tabs (Tamoxifen citrate) .... Take one tablet by mouth once a day  Patient Instructions: 1)  A1C 6.2 = perfect!  Keep up the good work. 2)  Urine is negative for protein - good! 3)  BP looks great. 4)  Stay on current meds. 5)  Work on low carb/ low sugar diabetic diet with regular exercise. 6)  AM fasting sugar goal is 80-110 and 2 hrs after dinner <150. 7)  Let me know if you need anything, otherwise, follow up in 4 mos.   Orders Added: 1)  Fingerstick [36416] 2)  Hgb A1C [83036QW] 3)  Urine Microalbumin [82044] 4)  Est. Patient Level IV [[36067]   Laboratory Results   Urine Tests    Microalbumin (urine): 10 mg/L Creatinine: 552mdL  A:C Ratio <30  Blood Tests     HGBA1C: 6.2%   (Normal Range: Non-Diabetic - 3-6%   Control Diabetic - 6-8%)

## 2010-09-25 NOTE — Letter (Signed)
Summary: [facility]   Imported By: Edmonia James 09/16/2010 09:51:12  _____________________________________________________________________  External Attachment:    Type:   Image     Comment:   External Document

## 2010-09-25 NOTE — Letter (Signed)
Summary: [facility]   Imported By: Edmonia James 08/15/2010 08:12:26  _____________________________________________________________________  External Attachment:    Type:   Image     Comment:   External Document

## 2010-10-08 ENCOUNTER — Ambulatory Visit: Payer: Self-pay | Admitting: Family Medicine

## 2010-11-05 ENCOUNTER — Ambulatory Visit: Payer: Self-pay | Admitting: Cardiology

## 2010-11-07 LAB — LACTATE DEHYDROGENASE: LDH: 165 U/L (ref 94–250)

## 2010-11-07 LAB — DIFFERENTIAL
Eosinophils Absolute: 0.1 10*3/uL (ref 0.0–0.7)
Eosinophils Relative: 1 % (ref 0–5)
Lymphocytes Relative: 39 % (ref 12–46)
Lymphs Abs: 3.5 10*3/uL (ref 0.7–4.0)
Monocytes Absolute: 0.6 10*3/uL (ref 0.1–1.0)
Monocytes Relative: 7 % (ref 3–12)

## 2010-11-07 LAB — CBC
Hemoglobin: 12.1 g/dL (ref 12.0–15.0)
MCH: 27.9 pg (ref 26.0–34.0)
MCV: 87.3 fL (ref 78.0–100.0)
RBC: 4.33 MIL/uL (ref 3.87–5.11)
RDW: 14.7 % (ref 11.5–15.5)
WBC: 9.1 10*3/uL (ref 4.0–10.5)

## 2010-11-07 LAB — GLUCOSE, CAPILLARY
Glucose-Capillary: 112 mg/dL — ABNORMAL HIGH (ref 70–99)
Glucose-Capillary: 123 mg/dL — ABNORMAL HIGH (ref 70–99)

## 2010-11-07 LAB — COMPREHENSIVE METABOLIC PANEL
ALT: 17 U/L (ref 0–35)
CO2: 28 mEq/L (ref 19–32)
GFR calc non Af Amer: >60 mL/min (ref >60)
Sodium: 138 mEq/L (ref 135–145)
Total Bilirubin: 0.4 mg/dL (ref 0.3–1.2)

## 2010-11-07 LAB — URINALYSIS, ROUTINE W REFLEX MICROSCOPIC
Bilirubin Urine: NEGATIVE
Glucose, UA: NEGATIVE mg/dL
Ketones, ur: NEGATIVE mg/dL
Nitrite: NEGATIVE
Specific Gravity, Urine: 1.016 (ref 1.005–1.030)
pH: 6.5 (ref 5.0–8.0)

## 2010-11-07 LAB — CANCER ANTIGEN 27.29: CA 27.29: 11 U/mL (ref 0–39)

## 2010-11-07 LAB — SURGICAL PCR SCREEN: MRSA, PCR: POSITIVE — AB

## 2010-11-13 LAB — BASIC METABOLIC PANEL
BUN: 18 mg/dL (ref 6–23)
Calcium: 9.9 mg/dL (ref 8.4–10.5)
GFR calc non Af Amer: >60 mL/min (ref >60)
Glucose, Bld: 111 mg/dL — ABNORMAL HIGH (ref 70–99)
Potassium: 3.8 mEq/L (ref 3.5–5.1)
Sodium: 138 mEq/L (ref 135–145)

## 2010-11-13 LAB — GLUCOSE, CAPILLARY
Glucose-Capillary: 101 mg/dL — ABNORMAL HIGH (ref 70–99)
Glucose-Capillary: 113 mg/dL — ABNORMAL HIGH (ref 70–99)

## 2010-11-18 ENCOUNTER — Encounter: Payer: Self-pay | Admitting: Cardiology

## 2010-11-19 ENCOUNTER — Ambulatory Visit (INDEPENDENT_AMBULATORY_CARE_PROVIDER_SITE_OTHER): Payer: Medicare FFS | Admitting: Cardiology

## 2010-11-19 ENCOUNTER — Encounter: Payer: Self-pay | Admitting: Cardiology

## 2010-11-19 VITALS — BP 145/84 | HR 79 | Resp 18 | Ht 62.0 in | Wt 208.4 lb

## 2010-11-19 DIAGNOSIS — I509 Heart failure, unspecified: Secondary | ICD-10-CM

## 2010-11-19 DIAGNOSIS — I5032 Chronic diastolic (congestive) heart failure: Secondary | ICD-10-CM

## 2010-11-19 MED ORDER — DILTIAZEM HCL ER COATED BEADS 120 MG PO CP24: ORAL_CAPSULE | ORAL | Status: DC

## 2010-11-19 NOTE — Assessment & Plan Note (Signed)
Management per electrophysiology. 

## 2010-11-19 NOTE — Assessment & Plan Note (Signed)
Continue statin. 

## 2010-11-19 NOTE — Assessment & Plan Note (Signed)
Patient euvolemic on examination. Continue present medications and diuretic. Potassium and renal function monitored by primary care. Most recent echocardiogram reveals normal function.

## 2010-11-19 NOTE — Assessment & Plan Note (Signed)
Blood pressure mildly elevated. I will consolidate medications. Discontinue nitrates. Increase Cardizem to 120 in the morning and 240 in the evening.

## 2010-11-19 NOTE — Progress Notes (Signed)
HPI: Pleasant female with past medical history of congestive heart failure for F/U. Last Echo in April of 2011 revealed normal LV function, mild left atrial enlargement and a trivial pericardial effusion. Patient also has a history of IV pacemaker. Since she was last seen, she had a near she attributes to pollen. There is no orthopnea, PND, pedal edema, syncope or chest pain.  Current Outpatient Prescriptions  Medication Sig Dispense Refill  . acetaminophen (ARTHRITIS PAIN RELIEF) 650 MG CR tablet Take 650 mg by mouth every 8 (eight) hours as needed.        [name] albuterol (PROAIR HFA) 108 (90 BASE) MCG/ACT inhaler Inhale 2 puffs into the lungs every 6 (six) hours as needed.        . bumetanide (BUMEX) 1 MG tablet Take 1 mg by mouth daily.        . Calcium Carb-Cholecalciferol (CALCIUM PLUS VITAMIN D3 PO) Take 2 tablets by mouth daily.        . carvedilol (COREG) 6.25 MG tablet Take 6.25 mg by mouth 2 (two) times daily with a meal.        . cycloSPORINE (RESTASIS) 0.05 % ophthalmic emulsion 1 drop 2 (two) times daily.        [name] diltiazem (DILT-CD) 120 MG 24 hr capsule Take 120 mg by mouth 2 (two) times daily.        . diphenhydrAMINE (BENYLIN) 12.5 MG/5ML liquid Take by mouth 4 (four) times daily as needed.        . DULoxetine (CYMBALTA) 60 MG capsule Take 60 mg by mouth daily.        [name] esomeprazole (NEXIUM) 40 MG capsule Take 40 mg by mouth daily before breakfast.        . fexofenadine (ALLEGRA) 180 MG tablet Take 180 mg by mouth daily.        . Fluticasone-Salmeterol (ADVAIR DISKUS) 250-50 MCG/DOSE AEPB Inhale 2 puffs into the lungs every 12 (twelve) hours.        . gabapentin (NEURONTIN) 600 MG tablet 2 tabs po tid      . insulin glargine (LANTUS SOLOSTAR) 100 UNIT/ML injection Inject 15 Units into the skin at bedtime.        [name] levothyroxine (SYNTHROID, LEVOTHROID) 50 MCG tablet Take 50 mcg by mouth daily.        [name] losartan (COZAAR) 50 MG tablet Take 50 mg by mouth 2 (two) times daily.        .  montelukast (SINGULAIR) 10 MG tablet Take 10 mg by mouth at bedtime.        . Multiple Vitamin (MULTIVITAMIN) tablet Take 1 tablet by mouth daily.        . Multiple Vitamins-Minerals (OCUVITE ADULT 50+) CAPS Take 1 capsule by mouth daily.        . pravastatin (PRAVACHOL) 40 MG tablet Take 40 mg by mouth daily.        . tamoxifen (NOLVADEX) 20 MG tablet Take 20 mg by mouth daily.        [name] DISCONTD: isosorbide mononitrate (IMDUR) 30 MG CR tablet Take 30 mg by mouth every morning.           Past Medical History  Diagnosis Date  . HTN (hypertension)   . CHF (congestive heart failure)   . LBBB (left bundle branch block)   . Fibromyalgia   . Alkaline phosphatase elevation   . Hypothyroidism   . Closed fracture of unspecified part of radius (alone)   . Depression   .  DDD (degenerative disc disease), lumbar   . Carpal tunnel syndrome   . IBS (irritable bowel syndrome)   . Breast cancer     Past Surgical History  Procedure Date  . Pacemaker placement 2009    [facility] cardiology  . Knee surgery   . Foot surgery   . Tubal ligation   . Breast lumpectomy     History   Social History  . Marital Status: Divorced    Spouse Name: N/A    Number of Children: N/A  . Years of Education: N/A   Occupational History  . Not on file.   Social History Main Topics  . Smoking status: Former Research scientist (life sciences)  . Smokeless tobacco: Not on file  . Alcohol Use: No  . Drug Use: Not on file  . Sexually Active: Not on file   Other Topics Concern  . Not on file   Social History Narrative  . No narrative on file    ROS: no fevers or chills, productive cough, hemoptysis, dysphasia, odynophagia, melena, hematochezia, dysuria, hematuria, rash, seizure activity, orthopnea, PND, pedal edema, claudication. Remaining systems are negative.  Physical Exam: Well-developed well-nourished in no acute distress.  Skin is warm and dry.  HEENT is normal.  Neck is supple. No thyromegaly.  Chest is clear to  auscultation with normal expansion. Pacemaker left chest Cardiovascular exam is regular rate and rhythm.  Abdominal exam nontender or distended. No masses palpated. Extremities show no edema. neuro grossly intact  ECG Sinus rhythm with ventricular pacing.

## 2010-11-19 NOTE — Patient Instructions (Signed)
STOP ISOSORBIDE INCREASE DILTIAZEM 120MG TAKE ONE TABLET EVERY MORNING AND TWO TABLETS EVERY EVENING Your physician recommends that you schedule a follow-up appointment in: [place]

## 2010-11-19 NOTE — Assessment & Plan Note (Signed)
Management per primary care. 

## 2010-12-22 ENCOUNTER — Ambulatory Visit: Payer: Medicare FFS | Attending: Radiation Oncology | Admitting: Radiation Oncology

## 2010-12-26 ENCOUNTER — Ambulatory Visit: Payer: Self-pay | Admitting: Family Medicine

## 2010-12-26 ENCOUNTER — Ambulatory Visit (INDEPENDENT_AMBULATORY_CARE_PROVIDER_SITE_OTHER): Payer: Medicare FFS | Admitting: Family Medicine

## 2010-12-26 ENCOUNTER — Encounter: Payer: Self-pay | Admitting: Family Medicine

## 2010-12-26 VITALS — BP 133/77 | HR 76 | Ht 62.0 in | Wt 210.0 lb

## 2010-12-26 DIAGNOSIS — E039 Hypothyroidism, unspecified: Secondary | ICD-10-CM

## 2010-12-26 DIAGNOSIS — E119 Type 2 diabetes mellitus without complications: Secondary | ICD-10-CM

## 2010-12-26 DIAGNOSIS — C50919 Malignant neoplasm of unspecified site of unspecified female breast: Secondary | ICD-10-CM

## 2010-12-26 DIAGNOSIS — E538 Deficiency of other specified B group vitamins: Secondary | ICD-10-CM

## 2010-12-26 DIAGNOSIS — I10 Essential (primary) hypertension: Secondary | ICD-10-CM

## 2010-12-26 DIAGNOSIS — E559 Vitamin D deficiency, unspecified: Secondary | ICD-10-CM

## 2010-12-26 NOTE — Progress Notes (Signed)
  Subjective:    Patient ID: Tanya Hogan, female    DOB: 1951-07-25, 60 y.o.   MRN: [id number]  HPI 60 yo WF presents for f/u diabetes.  She is on Lantus 15 units HS.  Her A1C was 6.2 4 mos ago and 6.1 today.  Her home sugars are running -- her monofilament and urine micro were done Jan 2012. Fasting labs and TSH are due.    She has hx of Stage I invasive ductal cell carcinoma, 0.5 cm ER, PR neg, HER 2 neg s/p lumpectomy 03-2010 with XRT.  On Tamoxifen and followed by Dr Humphrey Rolls for oncology care.  Seeing Dr Harrie Foreman for neuro.  BP 133/77  Pulse 76  Ht 5' 2"  (1.575 m)  Wt 210 lb (95.255 kg)  BMI 38.41 kg/m2  SpO2 95%  Patient Active Problem List  Diagnoses  . INVASIVE DUCTAL CARCINOMA, LEFT BREAST  . UNSPECIFIED HYPOTHYROIDISM  . DM  . DIABETIC PERIPHERAL NEUROPATHY  . HYPERLIPIDEMIA  . DEPRESSION  . ESSENTIAL HYPERTENSION, BENIGN  . LEFT BUNDLE BRANCH BLOCK  . CHF  . ALLERGIC RHINITIS CAUSE UNSPECIFIED  . VAGINAL DISCHARGE  . RHUS DERMATITIS  . BACK PAIN, CHRONIC  . OSTEOPENIA  . FATIGUE  . ALKALINE PHOSPHATASE, ELEVATED  . CLOSED FRACTURE OF UNSPECIFIED PART OF RADIUS  . FALL, HX OF  . PACEMAKER, PERMANENT       Review of Systems  Constitutional: Positive for fatigue. Negative for fever, chills and appetite change.  HENT: Positive for congestion, rhinorrhea, sneezing and postnasal drip. Negative for sore throat.   Eyes: Negative for visual disturbance.  Respiratory: Negative for shortness of breath and wheezing.   Cardiovascular: Negative for chest pain, palpitations and leg swelling.  Gastrointestinal: Positive for diarrhea. Negative for nausea and abdominal pain.  Genitourinary: Negative for difficulty urinating.  Neurological: Positive for weakness. Negative for light-headedness and headaches.  Psychiatric/Behavioral: Negative for dysphoric mood. The patient is not nervous/anxious.         Objective:   Physical Exam  Constitutional: She appears  well-developed and well-nourished. No distress.  HENT:  Head: Normocephalic and atraumatic.  Eyes: No scleral icterus.  Neck: Neck supple. No thyromegaly present.  Cardiovascular: Normal rate, regular rhythm and normal heart sounds.   Pulmonary/Chest: Effort normal and breath sounds normal. No respiratory distress.  Musculoskeletal: She exhibits tenderness. She exhibits no edema.  Lymphadenopathy:    She has no cervical adenopathy.  Skin: Skin is warm and dry.  Psychiatric: She has a normal mood and affect.          Assessment & Plan:

## 2010-12-26 NOTE — Patient Instructions (Signed)
Update fasting labs downstairs one morning next wk. Will call you w/ results.  A1C great at 6.1. Work on [employer], regular exercise.  F/u with Dr Clarene Essex for allergies.    Return for f/u in 6 mos.

## 2010-12-26 NOTE — Assessment & Plan Note (Signed)
BP goal is <130/80.  Just over that today.  Has f/u with cards and oncology to recheck soon.

## 2010-12-26 NOTE — Assessment & Plan Note (Signed)
Due to recheck TSH today.

## 2010-12-26 NOTE — Assessment & Plan Note (Signed)
Complicated by neuropathy.  A1c good at 6.1, from 6.2.  Continue Lantus 15 units qhs.  Monitor home sugar readings.  Labs due.  Needs to work on diet/ exercise/ wt loss given her BMI of 38.4.

## 2010-12-30 ENCOUNTER — Telehealth: Payer: Self-pay | Admitting: Family Medicine

## 2010-12-30 LAB — COMPLETE METABOLIC PANEL WITH GFR
Albumin: 3.9 g/dL (ref 3.5–5.2)
Alkaline Phosphatase: 69 U/L (ref 39–117)
BUN: 12 mg/dL (ref 6–23)
Creat: 0.67 mg/dL (ref 0.40–1.20)
GFR, Est Non African American: >60 mL/min (ref >60)
Glucose, Bld: 111 mg/dL — ABNORMAL HIGH (ref 70–99)
Total Bilirubin: 0.3 mg/dL (ref 0.3–1.2)

## 2010-12-30 LAB — LIPID PANEL
Cholesterol: 174 mg/dL (ref 0–200)
HDL: 74 mg/dL (ref >39)
Total CHOL/HDL Ratio: 2.4 Ratio
Triglycerides: 143 mg/dL (ref <150)

## 2010-12-30 LAB — CBC WITH DIFFERENTIAL/PLATELET
Basophils Absolute: 0 10*3/uL (ref 0.0–0.1)
Basophils Relative: 1 % (ref 0–1)
Eosinophils Absolute: 0.1 10*3/uL (ref 0.0–0.7)
Eosinophils Relative: 2 % (ref 0–5)
HCT: 39.7 % (ref 36.0–46.0)
Hemoglobin: 12.3 g/dL (ref 12.0–15.0)
MCH: 28.7 pg (ref 26.0–34.0)
MCHC: 31 g/dL (ref 30.0–36.0)
Monocytes Absolute: 0.3 10*3/uL (ref 0.1–1.0)
Monocytes Relative: 6 % (ref 3–12)
RDW: 14.6 % (ref 11.5–15.5)

## 2010-12-30 LAB — VITAMIN B12: Vitamin B-12: 508 pg/mL (ref 211–911)

## 2010-12-30 MED ORDER — LEVOTHYROXINE SODIUM 75 MCG PO TABS: 75.0000 ug | ORAL_TABLET | Freq: Every day | ORAL | Status: DC

## 2010-12-30 NOTE — Telephone Encounter (Signed)
Pt aware of the above

## 2010-12-30 NOTE — Telephone Encounter (Signed)
Pls let pt know that her blood counts , kidney and liver function came back normal.  Cholesterol is at goal.  Vitamin D is at goal.  We have room to go up on her dose of thyroid medication which may help her energy level.  New RX for levothyroxine 75 mcg sent to pharmacy to change to.  reheck TSH in 8 wks.

## 2011-02-20 ENCOUNTER — Encounter: Payer: Self-pay | Admitting: *Deleted

## 2011-02-23 ENCOUNTER — Other Ambulatory Visit: Payer: Self-pay | Admitting: Oncology

## 2011-02-23 ENCOUNTER — Encounter (HOSPITAL_BASED_OUTPATIENT_CLINIC_OR_DEPARTMENT_OTHER): Payer: Medicare FFS | Admitting: Oncology

## 2011-02-23 DIAGNOSIS — C50919 Malignant neoplasm of unspecified site of unspecified female breast: Secondary | ICD-10-CM

## 2011-02-23 DIAGNOSIS — Z170 Estrogen receptor positive status [ER+]: Secondary | ICD-10-CM

## 2011-02-23 DIAGNOSIS — C50419 Malignant neoplasm of upper-outer quadrant of unspecified female breast: Secondary | ICD-10-CM

## 2011-02-23 LAB — COMPREHENSIVE METABOLIC PANEL
AST: 17 U/L (ref 0–37)
Albumin: 3.6 g/dL (ref 3.5–5.2)
BUN: 16 mg/dL (ref 6–23)
CO2: 24 mEq/L (ref 19–32)
Calcium: 9.4 mg/dL (ref 8.4–10.5)
Chloride: 106 mEq/L (ref 96–112)
Creatinine, Ser: 0.82 mg/dL (ref 0.50–1.10)
Glucose, Bld: 141 mg/dL — ABNORMAL HIGH (ref 70–99)
Potassium: 5.2 mEq/L (ref 3.5–5.3)

## 2011-02-23 LAB — CBC WITH DIFFERENTIAL/PLATELET
Basophils Absolute: 0 10*3/uL (ref 0.0–0.1)
Eosinophils Absolute: 0.1 10*3/uL (ref 0.0–0.5)
HCT: 34.5 % — ABNORMAL LOW (ref 34.8–46.6)
HGB: 11.6 g/dL (ref 11.6–15.9)
MCH: 30.1 pg (ref 25.1–34.0)
MONO#: 0.4 10*3/uL (ref 0.1–0.9)
NEUT#: 4.5 10*3/uL (ref 1.5–6.5)
NEUT%: 62 % (ref 38.4–76.8)
RDW: 14.2 % (ref 11.2–14.5)
WBC: 7.2 10*3/uL (ref 3.9–10.3)
lymph#: 2.2 10*3/uL (ref 0.9–3.3)

## 2011-03-05 ENCOUNTER — Other Ambulatory Visit: Payer: Self-pay | Admitting: Family Medicine

## 2011-03-05 DIAGNOSIS — C50919 Malignant neoplasm of unspecified site of unspecified female breast: Secondary | ICD-10-CM

## 2011-03-05 DIAGNOSIS — Z9889 Other specified postprocedural states: Secondary | ICD-10-CM

## 2011-03-18 ENCOUNTER — Telehealth: Payer: Self-pay | Admitting: *Deleted

## 2011-03-18 DIAGNOSIS — E039 Hypothyroidism, unspecified: Secondary | ICD-10-CM

## 2011-03-18 NOTE — Telephone Encounter (Signed)
Lab ordered.

## 2011-03-20 ENCOUNTER — Telehealth: Payer: Self-pay | Admitting: Family Medicine

## 2011-03-20 LAB — TSH: TSH: 1.72 u[IU]/mL (ref 0.350–4.500)

## 2011-03-20 MED ORDER — LEVOTHYROXINE SODIUM 75 MCG PO TABS: 75.0000 ug | ORAL_TABLET | Freq: Every day | ORAL | Status: DC

## 2011-03-20 NOTE — Telephone Encounter (Signed)
Pt aware of the above

## 2011-03-20 NOTE — Telephone Encounter (Signed)
Pls let pt know that her TSH is at goal.  Stay on current dose of thyroid medication.  Repeat TSH in 6 mos.

## 2011-03-30 ENCOUNTER — Ambulatory Visit
Admission: RE | Admit: 2011-03-30 | Discharge: 2011-03-30 | Disposition: A | Discharge status: Home / Self Care | Payer: Medicare FFS | Source: Ambulatory Visit | Attending: Family Medicine | Admitting: Family Medicine

## 2011-03-30 DIAGNOSIS — C50919 Malignant neoplasm of unspecified site of unspecified female breast: Secondary | ICD-10-CM

## 2011-03-30 DIAGNOSIS — Z9889 Other specified postprocedural states: Secondary | ICD-10-CM

## 2011-04-01 ENCOUNTER — Other Ambulatory Visit: Payer: Self-pay | Admitting: Gastroenterology

## 2011-04-01 DIAGNOSIS — R1011 Right upper quadrant pain: Secondary | ICD-10-CM

## 2011-04-02 ENCOUNTER — Ambulatory Visit
Admission: RE | Admit: 2011-04-02 | Discharge: 2011-04-02 | Disposition: A | Discharge status: Home / Self Care | Payer: Medicare FFS | Source: Ambulatory Visit | Attending: Gastroenterology | Admitting: Gastroenterology

## 2011-04-02 DIAGNOSIS — R1011 Right upper quadrant pain: Secondary | ICD-10-CM

## 2011-04-10 ENCOUNTER — Ambulatory Visit: Payer: Medicare FFS | Admitting: Internal Medicine

## 2011-04-10 ENCOUNTER — Ambulatory Visit (INDEPENDENT_AMBULATORY_CARE_PROVIDER_SITE_OTHER): Payer: Medicare FFS | Admitting: Internal Medicine

## 2011-04-10 ENCOUNTER — Encounter: Payer: Self-pay | Admitting: Internal Medicine

## 2011-04-10 DIAGNOSIS — I442 Atrioventricular block, complete: Secondary | ICD-10-CM

## 2011-04-10 DIAGNOSIS — Z950 Presence of cardiac pacemaker: Secondary | ICD-10-CM

## 2011-04-10 DIAGNOSIS — I509 Heart failure, unspecified: Secondary | ICD-10-CM

## 2011-04-10 DIAGNOSIS — I447 Left bundle-branch block, unspecified: Secondary | ICD-10-CM

## 2011-04-10 LAB — PACEMAKER DEVICE OBSERVATION
AL IMPEDENCE PM: 596 Ohm
ATRIAL PACING PM: 15
BAMS-0001: 150 {beats}/min
LV LEAD THRESHOLD: 1 V
RV LEAD THRESHOLD: 1 V

## 2011-04-10 NOTE — Patient Instructions (Signed)
Your physician wants you to follow-up in: 12 months with Dr. Taylor. You will receive a reminder letter in the mail two months in advance. If you don't receive a letter, please call our office to schedule the follow-up appointment.    

## 2011-04-12 ENCOUNTER — Encounter: Payer: Self-pay | Admitting: Internal Medicine

## 2011-04-12 NOTE — Progress Notes (Signed)
HPI [name] returns today for followup. She has a non-ischemic CM, chronic systolic heart failure, and LBBB s/p PPM. The patient has done well. She is s/p treatment for breast cancer. She denies syncope, c/p or sob. Minimal peripheral edema. Allergies  Allergen Reactions  . Clindamycin   . Codeine   . Penicillins   . Quinapril Hcl   . Quinine   . Rifampin   . Sulfonamide Derivatives      Current Outpatient Prescriptions  Medication Sig Dispense Refill  . acetaminophen (ARTHRITIS PAIN RELIEF) 650 MG CR tablet Take 650 mg by mouth every 8 (eight) hours as needed.        [name] albuterol (PROAIR HFA) 108 (90 BASE) MCG/ACT inhaler Inhale 2 puffs into the lungs every 6 (six) hours as needed.        . bumetanide (BUMEX) 1 MG tablet Take 1 mg by mouth daily.        . Calcium Carb-Cholecalciferol (CALCIUM PLUS VITAMIN D3 PO) Take 2 tablets by mouth daily.        . carvedilol (COREG) 6.25 MG tablet Take 6.25 mg by mouth 2 (two) times daily with a meal.        . cycloSPORINE (RESTASIS) 0.05 % ophthalmic emulsion 1 drop 2 (two) times daily.        [name] diltiazem (DILT-CD) 120 MG 24 hr capsule ONE TABLET IN THE AM AND TWO TABLETS IN THE PM  270 capsule  4  . DULoxetine (CYMBALTA) 60 MG capsule Take 60 mg by mouth daily.        [name] esomeprazole (NEXIUM) 40 MG capsule Take 40 mg by mouth daily before breakfast.        . fexofenadine (ALLEGRA) 180 MG tablet Take 180 mg by mouth 2 (two) times daily.       . Fluticasone-Salmeterol (ADVAIR DISKUS) 250-50 MCG/DOSE AEPB Inhale 2 puffs into the lungs every 12 (twelve) hours.        . gabapentin (NEURONTIN) 600 MG tablet 3 tabs po tid      . insulin glargine (LANTUS SOLOSTAR) 100 UNIT/ML injection Inject 15 Units into the skin at bedtime.        [name] levothyroxine (SYNTHROID, LEVOTHROID) 75 MCG tablet Take 1 tablet (75 mcg total) by mouth daily.  90 tablet  1  . losartan (COZAAR) 50 MG tablet Take 50 mg by mouth 2 (two) times daily.        . montelukast (SINGULAIR)  10 MG tablet Take 10 mg by mouth at bedtime.        . Multiple Vitamin (MULTIVITAMIN) tablet Take 1 tablet by mouth daily.        . Multiple Vitamins-Minerals (OCUVITE ADULT 50+) CAPS Take 1 capsule by mouth daily.        . NON FORMULARY Take 0.125 mg by mouth as needed. Muscle relaxer taken as needed before meals       . pravastatin (PRAVACHOL) 40 MG tablet Take 40 mg by mouth daily.        . tamoxifen (NOLVADEX) 20 MG tablet Take 20 mg by mouth daily.        [name] tiZANidine (ZANAFLEX) 2 MG tablet Take 2 mg by mouth 2 (two) times daily.           Past Medical History  Diagnosis Date  . HTN (hypertension)   . CHF (congestive heart failure)   . LBBB (left bundle branch block)   . Fibromyalgia   . Alkaline phosphatase elevation   .  Hypothyroidism   . Closed fracture of unspecified part of radius (alone)   . Depression   . DDD (degenerative disc disease), lumbar   . Carpal tunnel syndrome   . IBS (irritable bowel syndrome)   . Breast cancer     ROS:   All systems reviewed and negative except as noted in the HPI.   Past Surgical History  Procedure Date  . Pacemaker placement 2009    [facility] cardiology  . Knee surgery   . Foot surgery   . Tubal ligation   . Breast lumpectomy      Family History  Problem Relation Age of Onset  . Heart failure    . Breast cancer    . Hypertension       History   Social History  . Marital Status: Divorced    Spouse Name: N/A    Number of Children: N/A  . Years of Education: N/A   Occupational History  . Not on file.   Social History Main Topics  . Smoking status: Former Smoker    Quit date: 08/24/1980  . Smokeless tobacco: Not on file  . Alcohol Use: No  . Drug Use: Not on file  . Sexually Active: Not on file   Other Topics Concern  . Not on file   Social History Narrative  . No narrative on file     BP 142/82  Pulse 66  Ht 5' 1"  (1.549 m)  Wt 210 lb (95.255 kg)  BMI 39.68 kg/m2  Physical Exam: Obese, middle  age woman, NAD HEENT: Unremarkable Neck:  No JVD, no thyromegally Lymphatics:  No adenopathy Back:  No CVA tenderness Lungs:  Clear. Well healed PPM incision. HEART:  Regular rate rhythm, no murmurs, no rubs, no clicks Abd:  Obese, soft, positive bowel sounds, no organomegally, no rebound, no guarding Ext:  2 plus pulses, no edema, no cyanosis, no clubbing Skin:  No rashes no nodules Neuro:  CN II through XII intact, motor grossly intact  DEVICE  Normal device function.  See PaceArt for details.   Assess/Plan:

## 2011-04-12 NOTE — Assessment & Plan Note (Signed)
Her symptoms remain well controlled. I have asked her to continue her current meds, start walking daily and maintain a low sodium diet.

## 2011-04-12 NOTE — Assessment & Plan Note (Signed)
Her device is working normally. Will recheck in several months.

## 2011-04-14 ENCOUNTER — Encounter: Payer: Self-pay | Admitting: Family Medicine

## 2011-04-23 ENCOUNTER — Ambulatory Visit (INDEPENDENT_AMBULATORY_CARE_PROVIDER_SITE_OTHER): Payer: Medicare FFS | Admitting: Family Medicine

## 2011-04-23 ENCOUNTER — Telehealth: Payer: Self-pay | Admitting: Family Medicine

## 2011-04-23 ENCOUNTER — Encounter: Payer: Self-pay | Admitting: Family Medicine

## 2011-04-23 VITALS — BP 124/74 | HR 78 | Ht 62.0 in | Wt 214.0 lb

## 2011-04-23 DIAGNOSIS — K589 Irritable bowel syndrome without diarrhea: Secondary | ICD-10-CM

## 2011-04-23 DIAGNOSIS — E669 Obesity, unspecified: Secondary | ICD-10-CM

## 2011-04-23 DIAGNOSIS — Z23 Encounter for immunization: Secondary | ICD-10-CM

## 2011-04-23 NOTE — Assessment & Plan Note (Signed)
Congratulated her on her 50 lb weight loss. She is frustrated by her recent gain of 10 lbs. Discussed increasing her no. Of days of exercise and actually doing a food diary to see if she is consistantly eating 1600 calories. She is not a candidate for wt loss med phentermine. Maybe could take orlistat.

## 2011-04-23 NOTE — Progress Notes (Signed)
  Subjective:    Patient ID: Tanya Hogan, female    DOB: Dec 22, 1950, 60 y.o.   MRN: [id number]  HPI Hx of spastic colon. Really soft stools lately, has been much worse. When pees will pass some stool.  She takes one capsul of miralx daily because was told to by GI or else she gets very easily constipated.  Was seeing Blair Heys. Had lost about 50 lbs but has gained back 10 lbs.  Says GI put her on 1 cap of miralax once a day. Was eating 1800 calories but told to eat about 1600 calories. She think she is eating this but hasn't actually been tracking her calories.     Review of Systems     Objective:   Physical Exam  Constitutional: She is oriented to person, place, and time. She appears well-developed and well-nourished.  Cardiovascular: Normal rate, regular rhythm and normal heart sounds.   Pulmonary/Chest: Effort normal and breath sounds normal.  Neurological: She is alert and oriented to person, place, and time.  Skin: Skin is warm and dry.  Psychiatric: She has a normal mood and affect. Her behavior is normal.          Assessment & Plan:  Spastic colon- Can try dec her miralax to 1/2 capful and see if less stool incontinenc. Also consider a probiotic to take for one month adn then come off and see if helps.

## 2011-04-23 NOTE — Telephone Encounter (Signed)
Call pt: I forgot to remind her she is due for her diabetic f/u in Sept.

## 2011-04-23 NOTE — Patient Instructions (Addendum)
Decrease your miralax to 1/2 cupful.   Can try Align (probiotic).   Do a calorie diary for one week.   Make sure getting exercise every day for weight loos.

## 2011-04-24 DIAGNOSIS — Z23 Encounter for immunization: Secondary | ICD-10-CM

## 2011-04-24 NOTE — Telephone Encounter (Signed)
Pt aware.

## 2011-05-26 ENCOUNTER — Other Ambulatory Visit: Payer: Self-pay | Admitting: *Deleted

## 2011-05-26 MED ORDER — CARVEDILOL 6.25 MG PO TABS: 6.2500 mg | ORAL_TABLET | Freq: Two times a day (BID) | ORAL | Status: DC

## 2011-05-26 MED ORDER — LOSARTAN POTASSIUM 50 MG PO TABS: 50.0000 mg | ORAL_TABLET | Freq: Two times a day (BID) | ORAL | Status: DC

## 2011-05-29 ENCOUNTER — Inpatient Hospital Stay (INDEPENDENT_AMBULATORY_CARE_PROVIDER_SITE_OTHER)
Admission: RE | Admit: 2011-05-29 | Discharge: 2011-05-29 | Disposition: A | Discharge status: Home / Self Care | Payer: Medicare FFS | Source: Ambulatory Visit | Attending: Family Medicine | Admitting: Family Medicine

## 2011-05-29 ENCOUNTER — Telehealth: Payer: Self-pay | Admitting: Cardiology

## 2011-05-29 ENCOUNTER — Encounter: Payer: Self-pay | Admitting: Family Medicine

## 2011-05-29 DIAGNOSIS — L0201 Cutaneous abscess of face: Secondary | ICD-10-CM

## 2011-05-29 DIAGNOSIS — T6391XA Toxic effect of contact with unspecified venomous animal, accidental (unintentional), initial encounter: Secondary | ICD-10-CM | POA: Insufficient documentation

## 2011-05-29 DIAGNOSIS — L03211 Cellulitis of face: Secondary | ICD-10-CM

## 2011-05-29 MED ORDER — PRAVASTATIN SODIUM 40 MG PO TABS: 40.0000 mg | ORAL_TABLET | Freq: Every day | ORAL | Status: DC

## 2011-05-29 MED ORDER — LOSARTAN POTASSIUM 50 MG PO TABS: 50.0000 mg | ORAL_TABLET | Freq: Two times a day (BID) | ORAL | Status: DC

## 2011-05-29 MED ORDER — CARVEDILOL 6.25 MG PO TABS: 6.2500 mg | ORAL_TABLET | Freq: Two times a day (BID) | ORAL | Status: DC

## 2011-05-29 NOTE — Telephone Encounter (Signed)
Rx's refills sent to pharmacy

## 2011-05-29 NOTE — Telephone Encounter (Signed)
Pt needs refill on previstatin 88m qd, carvedilol 6.213mbid, losartin 5037mid called into [facility]

## 2011-06-30 ENCOUNTER — Ambulatory Visit: Payer: Medicare FFS | Admitting: Family Medicine

## 2011-07-01 ENCOUNTER — Other Ambulatory Visit: Payer: Self-pay | Admitting: *Deleted

## 2011-07-27 NOTE — Progress Notes (Signed)
Summary: Sting on Lip rm 5   Vital Signs:  Patient Profile:   60 Years Old Female CC:      sting on lip x 1wk Height:     62.1 inches Weight:      206.75 pounds O2 Sat:      95 % O2 treatment:    Room Air Temp:     98.2 degrees F oral Pulse rate:   78 / minute Resp:     18 per minute BP sitting:   127 / 80  (right arm) Cuff size:   regular on forearm  Vitals Entered By: Charna Archer LPN (May 28, 5026 8:53 AM)                  Prior Medication List:  LOSARTAN POTASSIUM 50 MG TABS (LOSARTAN POTASSIUM) 1 tab by mouth bid LANTUS SOLOSTAR 100 UNIT/ML SOLN (INSULIN GLARGINE) 15 units Byram injection qAM ARTHRITIS PAIN RELIEF 650 MG CR-TABS (ACETAMINOPHEN) take two tablets by mouth once a day CYMBALTA 60 MG CPEP (DULOXETINE HCL) take one tablet by mouth once ad ay SYNTHROID 50 MCG TABS (LEVOTHYROXINE SODIUM) 1 tab by mouth daily ISOSORBIDE MONONITRATE CR 30 MG XR24H-TAB (ISOSORBIDE MONONITRATE) 1  tab once daily CARVEDILOL 6.25 MG TABS (CARVEDILOL) Take one tablet by mouth twice a day GABAPENTIN 600 MG TABS (GABAPENTIN) Take 2 tab by mouth three times a day BUMETANIDE 1 MG TABS (BUMETANIDE) Take 1 tablet by mouth two times a day MULTIVITAMINS  TABS (MULTIPLE VITAMIN) take one tablet by mouth once ad ay CALCIUM 600 1500 MG TABS (CALCIUM CARBONATE) 2 tablets once a day ADVAIR DISKUS 250-50 MCG/DOSE AEPB (FLUTICASONE-SALMETEROL) Use one puff twice a day BD INSULIN SYRINGE 27.5G X 5/8" 2 ML MISC (INSULIN SYRINGE-NEEDLE U-100) use as directed FREESTYLE LITE TEST  STRP (GLUCOSE BLOOD) use twice daily as directed NEXIUM 40 MG CPDR (ESOMEPRAZOLE MAGNESIUM) Take 1 capsule by mouth once a day SINGULAIR 10 MG TABS (MONTELUKAST SODIUM) Take 1 tablet by mouth once a day RESTASIS 0.05 % EMUL (CYCLOSPORINE) one drop two times a day TIZANIDINE HCL 4 MG TABS (TIZANIDINE HCL) Take 2 tablet by mouth two times a day PROAIR HFA 108 (90 BASE) MCG/ACT AERS (ALBUTEROL SULFATE) as needed ACCU-CHEK  SOFT TOUCH LANCETS  MISC (LANCETS) use twice daily as directed PRAVASTATIN SODIUM 40 MG TABS (PRAVASTATIN SODIUM) 1 tab by mouth qhs ALLEGRA 180 MG TABS (FEXOFENADINE HCL) 1 tab by mouth daily HYOSCYAMINE SULFATE 0.125 MG TABS (HYOSCYAMINE SULFATE) as needed * MEGARED CAPSULE Take 1 capsule by mouth once a day DILTIAZEM HCL ER BEADS 120 MG XR24H-CAP (DILTIAZEM HCL ER BEADS) Take 1 capsule by mouth two times a day BD PEN NEEDLE ULTRAFINE 29G X 12.7MM MISC (INSULIN PEN NEEDLE) use daily as directed ORPHENADRINE CITRATE CR 100 MG XR12H-TAB (ORPHENADRINE CITRATE) Take one tablet by mouth twice a day TAMOXIFEN CITRATE 20 MG TABS (TAMOXIFEN CITRATE) Take one tablet by mouth once a day   Updated Prior Medication List: LOSARTAN POTASSIUM 50 MG TABS (LOSARTAN POTASSIUM) 1 tab by mouth bid LANTUS SOLOSTAR 100 UNIT/ML SOLN (INSULIN GLARGINE) 15 units Baxter injection qAM ARTHRITIS PAIN RELIEF 650 MG CR-TABS (ACETAMINOPHEN) take two tablets by mouth once a day CYMBALTA 60 MG CPEP (DULOXETINE HCL) take one tablet by mouth once ad ay SYNTHROID 50 MCG TABS (LEVOTHYROXINE SODIUM) 1 tab by mouth daily ISOSORBIDE MONONITRATE CR 30 MG XR24H-TAB (ISOSORBIDE MONONITRATE) 1  tab once daily CARVEDILOL 6.25 MG TABS (CARVEDILOL) Take one tablet by mouth twice  a day GABAPENTIN 600 MG TABS (GABAPENTIN) Take 2 tab by mouth three times a day BUMETANIDE 1 MG TABS (BUMETANIDE) Take 1 tablet by mouth two times a day MULTIVITAMINS  TABS (MULTIPLE VITAMIN) take one tablet by mouth once ad ay CALCIUM 600 1500 MG TABS (CALCIUM CARBONATE) 2 tablets once a day ADVAIR DISKUS 250-50 MCG/DOSE AEPB (FLUTICASONE-SALMETEROL) Use one puff twice a day BD INSULIN SYRINGE 27.5G X 5/8" 2 ML MISC (INSULIN SYRINGE-NEEDLE U-100) use as directed FREESTYLE LITE TEST  STRP (GLUCOSE BLOOD) use twice daily as directed NEXIUM 40 MG CPDR (ESOMEPRAZOLE MAGNESIUM) Take 1 capsule by mouth once a day SINGULAIR 10 MG TABS (MONTELUKAST SODIUM) Take 1 tablet  by mouth once a day RESTASIS 0.05 % EMUL (CYCLOSPORINE) one drop two times a day TIZANIDINE HCL 4 MG TABS (TIZANIDINE HCL) Take 2 tablet by mouth two times a day PROAIR HFA 108 (90 BASE) MCG/ACT AERS (ALBUTEROL SULFATE) as needed ACCU-CHEK SOFT TOUCH LANCETS  MISC (LANCETS) use twice daily as directed PRAVASTATIN SODIUM 40 MG TABS (PRAVASTATIN SODIUM) 1 tab by mouth qhs ALLEGRA 180 MG TABS (FEXOFENADINE HCL) 1 tab by mouth daily HYOSCYAMINE SULFATE 0.125 MG TABS (HYOSCYAMINE SULFATE) as needed * MEGARED CAPSULE Take 1 capsule by mouth once a day DILTIAZEM HCL ER BEADS 120 MG XR24H-CAP (DILTIAZEM HCL ER BEADS) Take 1 capsule by mouth two times a day BD PEN NEEDLE ULTRAFINE 29G X 12.7MM MISC (INSULIN PEN NEEDLE) use daily as directed ORPHENADRINE CITRATE CR 100 MG XR12H-TAB (ORPHENADRINE CITRATE) Take one tablet by mouth twice a day TAMOXIFEN CITRATE 20 MG TABS (TAMOXIFEN CITRATE) Take one tablet by mouth once a day  Current Allergies (reviewed today): ! QUININE ! SULFA ! PCN ! RIFADIN (RIFAMPIN) ! CLINDAMYCIN ! ACCUPRIL ! * BAKING SODA ! CODEINEHistory of Present Illness Chief Complaint: sting on lip x 1wk History of Present Illness: Patient reports removing a stinger from the bottom of her L lower lip about a wek ago. She states she has been having swelling of yhe lip since then.  Current Problems: CELLULITIS AND ABSCESS OF FACE (ICD-682.0) STING (ICD-989.5) VAGINAL DISCHARGE (ICD-623.5) PRE-OPERATIVE CARDIAC EXAM (ICD-V72.81) INVASIVE DUCTAL CARCINOMA, LEFT BREAST (ICD-174.9) HEALTH MAINTENANCE EXAM (ICD-V70.0) OTHER SCREENING MAMMOGRAM (ICD-V76.12) RHUS DERMATITIS (ICD-692.6) FATIGUE (ICD-780.79) HYPERLIPIDEMIA (ICD-272.4) OSTEOPENIA (ICD-733.90) LEFT BUNDLE BRANCH BLOCK (ICD-426.3) PACEMAKER, PERMANENT (ICD-V45.01) ESSENTIAL HYPERTENSION, BENIGN (ICD-401.1) CHF (ICD-428.0) ALKALINE PHOSPHATASE, ELEVATED (ICD-790.5) DIABETIC PERIPHERAL NEUROPATHY (ICD-250.60) UNSPECIFIED  HYPOTHYROIDISM (ICD-244.9) CLOSED FRACTURE OF UNSPECIFIED PART OF RADIUS (ICD-813.81) FALL, HX OF (ICD-V15.88) DEPRESSION (ICD-311) BACK PAIN, CHRONIC (ICD-724.5) ALLERGIC RHINITIS CAUSE UNSPECIFIED (ICD-477.9) DM (ICD-250.00)   Current Meds LOSARTAN POTASSIUM 50 MG TABS (LOSARTAN POTASSIUM) 1 tab by mouth bid LANTUS SOLOSTAR 100 UNIT/ML SOLN (INSULIN GLARGINE) 15 units Frannie injection qAM ARTHRITIS PAIN RELIEF 650 MG CR-TABS (ACETAMINOPHEN) take two tablets by mouth once a day CYMBALTA 60 MG CPEP (DULOXETINE HCL) take one tablet by mouth once ad ay SYNTHROID 50 MCG TABS (LEVOTHYROXINE SODIUM) 1 tab by mouth daily ISOSORBIDE MONONITRATE CR 30 MG XR24H-TAB (ISOSORBIDE MONONITRATE) 1  tab once daily CARVEDILOL 6.25 MG TABS (CARVEDILOL) Take one tablet by mouth twice a day GABAPENTIN 600 MG TABS (GABAPENTIN) Take 2 tab by mouth three times a day BUMETANIDE 1 MG TABS (BUMETANIDE) Take 1 tablet by mouth two times a day MULTIVITAMINS  TABS (MULTIPLE VITAMIN) take one tablet by mouth once ad ay CALCIUM 600 1500 MG TABS (CALCIUM CARBONATE) 2 tablets once a day ADVAIR DISKUS 250-50 MCG/DOSE AEPB (FLUTICASONE-SALMETEROL) Use one puff twice a day  BD INSULIN SYRINGE 27.5G X 5/8" 2 ML MISC (INSULIN SYRINGE-NEEDLE U-100) use as directed FREESTYLE LITE TEST  STRP (GLUCOSE BLOOD) use twice daily as directed NEXIUM 40 MG CPDR (ESOMEPRAZOLE MAGNESIUM) Take 1 capsule by mouth once a day SINGULAIR 10 MG TABS (MONTELUKAST SODIUM) Take 1 tablet by mouth once a day RESTASIS 0.05 % EMUL (CYCLOSPORINE) one drop two times a day TIZANIDINE HCL 4 MG TABS (TIZANIDINE HCL) Take 2 tablet by mouth two times a day PROAIR HFA 108 (90 BASE) MCG/ACT AERS (ALBUTEROL SULFATE) as needed ACCU-CHEK SOFT TOUCH LANCETS  MISC (LANCETS) use twice daily as directed PRAVASTATIN SODIUM 40 MG TABS (PRAVASTATIN SODIUM) 1 tab by mouth qhs ALLEGRA 180 MG TABS (FEXOFENADINE HCL) 1 tab by mouth daily HYOSCYAMINE SULFATE 0.125 MG TABS  (HYOSCYAMINE SULFATE) as needed * MEGARED CAPSULE Take 1 capsule by mouth once a day DILTIAZEM HCL ER BEADS 120 MG XR24H-CAP (DILTIAZEM HCL ER BEADS) Take 1 capsule by mouth two times a day BD PEN NEEDLE ULTRAFINE 29G X 12.7MM MISC (INSULIN PEN NEEDLE) use daily as directed ORPHENADRINE CITRATE CR 100 MG XR12H-TAB (ORPHENADRINE CITRATE) Take one tablet by mouth twice a day TAMOXIFEN CITRATE 20 MG TABS (TAMOXIFEN CITRATE) Take one tablet by mouth once a day DOXYCYCLINE HYCLATE 100 MG CAPS (DOXYCYCLINE HYCLATE) Take 1 tab twice a day BACTROBAN 2 % CREA (MUPIROCIN CALCIUM) apply 3 x a day for 10 days HYDROCODONE-ACETAMINOPHEN 5-325 MG TABS (HYDROCODONE-ACETAMINOPHEN) 1/4 to 1/2 tablet by mouth q 6 hrs as needed for pain  REVIEW OF SYSTEMS Constitutional Symptoms       Complains of fatigue.     Denies fever, chills, night sweats, weight loss, and weight gain.  Eyes       Complains of change in vision.      Denies eye pain, eye discharge, glasses, contact lenses, and eye surgery. Ear/Nose/Throat/Mouth       Complains of ear pain, frequent runny nose, frequent nose bleeds, sinus problems, sore throat, and hoarseness.      Denies hearing loss/aids, change in hearing, ear discharge, dizziness, and tooth pain or bleeding.  Respiratory       Complains of shortness of breath and asthma.      Denies dry cough, productive cough, wheezing, bronchitis, and emphysema/COPD.  Cardiovascular       Complains of murmurs and tires easily with exhertion.      Denies chest pain.    Gastrointestinal       Complains of stomach pain.      Denies nausea/vomiting, diarrhea, constipation, blood in bowel movements, and indigestion. Genitourniary       Denies painful urination, kidney stones, and loss of urinary control. Neurological       Complains of headaches, numbness, tingling, and tremors.      Denies paralysis, seizures, and fainting/blackouts. Musculoskeletal       Complains of muscle pain, joint pain, and  decreased range of motion.      Denies joint stiffness, redness, swelling, muscle weakness, and gout.  Skin       Denies bruising, unusual mles/lumps or sores, and hair/skin or nail changes.  Psych       Complains of anxiety/stress.      Denies mood changes, temper/anger issues, speech problems, depression, and sleep problems. Other Comments: pt c/o possible sting on lip x 1wk. no fever. she has applied OTC Benadryl with aloe oint.   Past History:  Family History: Last updated: 03/07/2010 brother CHF sister ulcer, breast cancer at 34  mother HTN, died of cancer, melanoma, parkinsons dz father HTN, died in his 85s from CHF and pacemaker  Social History: Last updated: 10/23/2009 newly divorced. Finished grade school only. Brother lives w/ her.  2 kids in [place] - a son and a daughter. Quit smoking in 1969. Walks 2 days/ wk. Alcohol Use - no  Risk Factors: Alcohol Use: 0 (04/26/2009) Exercise: yes (04/26/2009)  Risk Factors: Smoking Status: quit (04/26/2009)  Past Medical History: Reviewed history from 08/26/2010 and no changes required. Asthma H/O PUD ESSENTIAL HYPERTENSION, BENIGN (ICD-401.1) CHF (ICD-428.0) LBBB Fibromyalgia ALKALINE PHOSPHATASE, ELEVATED (ICD-790.5) DIABETIC PERIPHERAL NEUROPATHY (ICD-250.60) UNSPECIFIED HYPOTHYROIDISM (ICD-244.9) CLOSED FRACTURE OF UNSPECIFIED PART OF RADIUS (ICD-813.81) DEPRESSION (ICD-311) BACK PAIN, CHRONIC (ICD-724.5) ALLERGIC RHINITIS CAUSE UNSPECIFIED (ICD-477.9) DM (ICD-250.00) anxiety/ depression Lumbar DDD, chronic pain, Dr Sabra Heck at [facility] carpal tunnel IBS L breast cancer 03-2010  Past Surgical History: Reviewed history from 05/08/2010 and no changes required. pacemaker 2009 [facility] knee and foot surgery hysterectomy at 88 for bleeding Tubal ligation pen reduction and internal fixation, right distal  radius fracture, 2 pieces, with carpal tunnel release.  lumpectomy 04-2010, Dr Dalbert Batman for  breast cancer  Family History: Reviewed history from 03/07/2010 and no changes required. brother CHF sister ulcer, breast cancer at 43 mother HTN, died of cancer, melanoma, parkinsons dz father HTN, died in his 42s from CHF and pacemaker  Social History: Reviewed history from 10/23/2009 and no changes required. newly divorced. Finished grade school only. Brother lives w/ her.  2 kids in [place] - a son and a daughter. Quit smoking in 1969. Walks 2 days/ wk. Alcohol Use - no Physical Exam General appearance: obese, well developed, ill appearing Head: normocephalic, atraumatic Oral/Pharynx: L lower is swollen and tender to palpation Neck: supple,anterior lymphadenopathy present Skin: no obvious rashes or lesions MSE: oriented to time, place, and person Assessment Problems:   VAGINAL DISCHARGE (ICD-623.5) PRE-OPERATIVE CARDIAC EXAM (ICD-V72.81) INVASIVE DUCTAL CARCINOMA, LEFT BREAST (ICD-174.9) HEALTH MAINTENANCE EXAM (ICD-V70.0) OTHER SCREENING MAMMOGRAM (ICD-V76.12) RHUS DERMATITIS (ICD-692.6) FATIGUE (ICD-780.79) HYPERLIPIDEMIA (ICD-272.4) OSTEOPENIA (ICD-733.90) LEFT BUNDLE BRANCH BLOCK (ICD-426.3) PACEMAKER, PERMANENT (ICD-V45.01) ESSENTIAL HYPERTENSION, BENIGN (ICD-401.1) CHF (ICD-428.0) ALKALINE PHOSPHATASE, ELEVATED (ICD-790.5) DIABETIC PERIPHERAL NEUROPATHY (ICD-250.60) UNSPECIFIED HYPOTHYROIDISM (ICD-244.9) CLOSED FRACTURE OF UNSPECIFIED PART OF RADIUS (ICD-813.81) FALL, HX OF (ICD-V15.88) DEPRESSION (ICD-311) BACK PAIN, CHRONIC (ICD-724.5) ALLERGIC RHINITIS CAUSE UNSPECIFIED (ICD-477.9) DM (ICD-250.00) New Problems: CELLULITIS AND ABSCESS OF FACE (ICD-682.0) STING (ICD-989.5)  place ice on lip  Patient Education: Patient and/or caregiver instructed in the following: rest fluids and Tylenol.  Plan New Medications/Changes: HYDROCODONE-ACETAMINOPHEN 5-325 MG TABS (HYDROCODONE-ACETAMINOPHEN) 1/4 to 1/2 tablet by mouth q 6 hrs as needed for pain  #12 x 0,  05/29/2011, Frederich Cha MD BACTROBAN 2 % CREA (MUPIROCIN CALCIUM) apply 3 x a day for 10 days  #1 x 0, 05/29/2011, Frederich Cha MD DOXYCYCLINE HYCLATE 100 MG CAPS (DOXYCYCLINE HYCLATE) Take 1 tab twice a day  #20 x 0, 05/29/2011, Frederich Cha MD  New Orders: Est. Patient Level III [22297] Follow Up: Follow up in 2-3 days if no improvement, Follow up on an as needed basis Follow Up: ENT if not improving  The patient and/or caregiver has been counseled thoroughly with regard to medications prescribed including dosage, schedule, interactions, rationale for use, and possible side effects and they verbalize understanding.  Diagnoses and expected course of recovery discussed and will return if not improved as expected or if the condition worsens. Patient and/or caregiver verbalized understanding.  Prescriptions: HYDROCODONE-ACETAMINOPHEN 5-325 MG TABS (HYDROCODONE-ACETAMINOPHEN) 1/4 to  1/2 tablet by mouth q 6 hrs as needed for pain  #12 x 0   Entered and Authorized by:   Frederich Cha MD   Signed by:   Frederich Cha MD on 05/29/2011   Method used:   Printed then faxed to ...       [facility] # [phone]* (retail)       [phone], [place]  [address]       Ph: [phone]       Fax: [phone]   RxID:   [id number] BACTROBAN 2 % CREA (MUPIROCIN CALCIUM) apply 3 x a day for 10 days  #1 x 0   Entered and Authorized by:   Frederich Cha MD   Signed by:   Frederich Cha MD on 05/29/2011   Method used:   Printed then faxed to ...       [facility] # [phone]* (retail)       [phone], [place]  [address]       Ph: [phone]       Fax: [phone]   RxID:   [id number] DOXYCYCLINE HYCLATE 100 MG CAPS (DOXYCYCLINE HYCLATE) Take 1 tab twice a day  #20 x 0   Entered and Authorized by:   Frederich Cha MD   Signed by:   Frederich Cha MD on 05/29/2011   Method used:   Printed then faxed to ...       [facility] # [phone]* (retail)       [phone] [address]       [place], [place]  [address]       Ph: [phone]       Fax: [phone]   RxID:   [id number]   Patient Instructions: 1)  Please schedule a follow-up appointment as needed. 2)  Please schedule an appointment with your primary doctor in :7-10 days if needed 3)  Take your antibiotic as prescribed until ALL of it is gone, but stop if you develop a rash or swelling and contact our office as soon as possible. 4)  If the swelling of the lips gets worse recommend [facility]to a facility ie [facility] w/an ENT available incasedrainage is needed. 5)  Apply ice to the area 20 minutes out of 2 hours as tolerated.  Orders Added: 1)  Est. Patient Level III [58832]

## 2011-07-28 ENCOUNTER — Encounter: Payer: Self-pay | Admitting: Family Medicine

## 2011-07-28 ENCOUNTER — Other Ambulatory Visit: Payer: Self-pay | Admitting: Family Medicine

## 2011-07-28 ENCOUNTER — Ambulatory Visit (INDEPENDENT_AMBULATORY_CARE_PROVIDER_SITE_OTHER): Payer: Medicare FFS | Admitting: Family Medicine

## 2011-07-28 VITALS — BP 119/69 | HR 71 | Wt 208.0 lb

## 2011-07-28 DIAGNOSIS — N760 Acute vaginitis: Secondary | ICD-10-CM

## 2011-07-28 MED ORDER — FLUCONAZOLE 150 MG PO TABS: 150.0000 mg | ORAL_TABLET | Freq: Once | ORAL | Status: AC

## 2011-07-28 NOTE — Progress Notes (Signed)
  Subjective:    Patient ID: Tanya Hogan, female    DOB: 19-Apr-1951, 60 y.o.   MRN: [id number]  HPI Had a vag hystrectomy in 1977.  Noticed a stain in her underware for awhile but saw blood when at the [facility] about  A week ago. Bright red spotting. No discomfort, itching. More back pain than usual.  She has been doing a lot of yard work.  Says her urinary incontinence has been better.  Has problem keeping her rectum clean since had hemorrhoid surgery.     Review of Systems     Objective:   Physical Exam  Constitutional: She is oriented to person, place, and time. She appears well-developed and well-nourished.  HENT:  Head: Normocephalic and atraumatic.  Genitourinary:       Vaginal mucosa appear pink and moist. White lesions that are easily wiped away.  No lesion or skin ulcerations, etc  Neurological: She is alert and oriented to person, place, and time.  Skin: Skin is warm and dry.  Psychiatric: She has a normal mood and affect. Her behavior is normal.          Assessment & Plan:  Vaginitis - No lesions to cause her bleeding. Suspect yeast vaginitis Will rx Diflucan 175m x 1. Will call with wet prep results.

## 2011-07-28 NOTE — Patient Instructions (Signed)
We will call you with your lab results. If you don't here from [place] in about a week then please give [place] a call at [phone].

## 2011-07-30 ENCOUNTER — Telehealth: Payer: Self-pay | Admitting: Oncology

## 2011-07-30 LAB — WET PREP, GENITAL: WBC, Wet Prep HPF POC: NONE SEEN

## 2011-07-30 NOTE — Telephone Encounter (Signed)
per pof 07/02 called pts scheduled appts for jan2013

## 2011-08-19 ENCOUNTER — Other Ambulatory Visit: Payer: Self-pay | Admitting: *Deleted

## 2011-08-19 MED ORDER — LEVOTHYROXINE SODIUM 75 MCG PO TABS: 75.0000 ug | ORAL_TABLET | Freq: Every day | ORAL | Status: DC

## 2011-08-21 ENCOUNTER — Other Ambulatory Visit: Payer: Self-pay | Admitting: *Deleted

## 2011-08-21 MED ORDER — LEVOTHYROXINE SODIUM 75 MCG PO TABS: 75.0000 ug | ORAL_TABLET | Freq: Every day | ORAL | Status: DC

## 2011-09-02 ENCOUNTER — Other Ambulatory Visit: Payer: Self-pay | Admitting: *Deleted

## 2011-09-02 MED ORDER — BUMETANIDE 1 MG PO TABS: 1.0000 mg | ORAL_TABLET | Freq: Every day | ORAL | Status: DC

## 2011-09-02 MED ORDER — LOSARTAN POTASSIUM 50 MG PO TABS: 50.0000 mg | ORAL_TABLET | Freq: Two times a day (BID) | ORAL | Status: DC

## 2011-09-09 ENCOUNTER — Telehealth: Payer: Self-pay | Admitting: *Deleted

## 2011-09-09 NOTE — Telephone Encounter (Signed)
patient confirmed over the phone the new date and time of the appointment starting at 9:30am on 09-18-2011

## 2011-09-16 ENCOUNTER — Telehealth: Payer: Self-pay | Admitting: *Deleted

## 2011-09-16 NOTE — Telephone Encounter (Signed)
Pt states she was  Taking 2 mg 1  A day and breaking them in half

## 2011-09-16 NOTE — Telephone Encounter (Signed)
LM for pt to returncall

## 2011-09-16 NOTE — Telephone Encounter (Signed)
Pt states that she takes her Bumex 20m tab BID for 10 years. We have it ordered as 176mtab daily. Please advise.

## 2011-09-16 NOTE — Telephone Encounter (Signed)
Based on the old chart it looks like it was filled for once a day back in February of last year. I am not sure how she had enough to actually take it twice a day when Dr. Valetta Close last filled it for once a day almost  A year ago. I couldn't tell from the notes if she had a reason for changing it to twice a day down to once a day. We can go back up to twice a day and he consented new prescription. Then we need to check a BMP in 2-3 weeks after she goes out twice today to make sure that it's not trying her kidneys out to much.

## 2011-09-17 ENCOUNTER — Other Ambulatory Visit: Payer: Self-pay | Admitting: Family

## 2011-09-17 DIAGNOSIS — C50919 Malignant neoplasm of unspecified site of unspecified female breast: Secondary | ICD-10-CM

## 2011-09-17 MED ORDER — BUMETANIDE 1 MG PO TABS: 1.0000 mg | ORAL_TABLET | Freq: Two times a day (BID) | ORAL | Status: DC

## 2011-09-17 NOTE — Telephone Encounter (Signed)
Sent new rx for 28m bid.

## 2011-09-18 ENCOUNTER — Ambulatory Visit (HOSPITAL_BASED_OUTPATIENT_CLINIC_OR_DEPARTMENT_OTHER): Payer: Medicare FFS | Admitting: Family

## 2011-09-18 ENCOUNTER — Other Ambulatory Visit (HOSPITAL_BASED_OUTPATIENT_CLINIC_OR_DEPARTMENT_OTHER): Payer: Medicare FFS | Admitting: Lab

## 2011-09-18 ENCOUNTER — Encounter: Payer: Self-pay | Admitting: Family

## 2011-09-18 VITALS — BP 104/64 | HR 72 | Temp 97.9°F | Ht 61.0 in | Wt 214.0 lb

## 2011-09-18 DIAGNOSIS — Z79810 Long term (current) use of selective estrogen receptor modulators (SERMs): Secondary | ICD-10-CM

## 2011-09-18 DIAGNOSIS — Z923 Personal history of irradiation: Secondary | ICD-10-CM

## 2011-09-18 DIAGNOSIS — C50919 Malignant neoplasm of unspecified site of unspecified female breast: Secondary | ICD-10-CM

## 2011-09-18 DIAGNOSIS — Z853 Personal history of malignant neoplasm of breast: Secondary | ICD-10-CM

## 2011-09-18 LAB — CBC WITH DIFFERENTIAL/PLATELET
BASO%: 0.9 % (ref 0.0–2.0)
Basophils Absolute: 0.1 10*3/uL (ref 0.0–0.1)
EOS%: 1.4 % (ref 0.0–7.0)
HCT: 34.4 % — ABNORMAL LOW (ref 34.8–46.6)
HGB: 11.4 g/dL — ABNORMAL LOW (ref 11.6–15.9)
LYMPH%: 34 % (ref 14.0–49.7)
MCH: 29.8 pg (ref 25.1–34.0)
MCHC: 33.3 g/dL (ref 31.5–36.0)
MCV: 89.5 fL (ref 79.5–101.0)
MONO%: 6.5 % (ref 0.0–14.0)
NEUT%: 57.2 % (ref 38.4–76.8)
Platelets: 295 10*3/uL (ref 145–400)

## 2011-09-18 LAB — COMPREHENSIVE METABOLIC PANEL
ALT: 17 U/L (ref 0–35)
AST: 19 U/L (ref 0–37)
BUN: 17 mg/dL (ref 6–23)
Calcium: 8.7 mg/dL (ref 8.4–10.5)
Creatinine, Ser: 0.76 mg/dL (ref 0.50–1.10)
Total Bilirubin: 0.3 mg/dL (ref 0.3–1.2)

## 2011-09-18 NOTE — Progress Notes (Signed)
[place]  Name: Tanya Hogan                  DATE: 09/18/2011 MRN: [id number]                      DOB: 1951-04-17  REFERRING PHYSICIAN: Beatrice Lecher, MD  DIAGNOSIS: Patient Active Problem List  Diagnoses Date Noted  . Obesity 04/23/2011  . INVASIVE DUCTAL CARCINOMA, LEFT BREAST 03/18/2010  . FATIGUE 01/09/2010  . HYPERLIPIDEMIA 11/07/2009  . OSTEOPENIA 10/29/2009  . LEFT BUNDLE BRANCH BLOCK 10/23/2009  . PACEMAKER, PERMANENT 10/23/2009  . ALKALINE PHOSPHATASE, ELEVATED 10/13/2009  . UNSPECIFIED HYPOTHYROIDISM 10/03/2009  . DIABETIC PERIPHERAL NEUROPATHY 10/03/2009  . CLOSED FRACTURE OF UNSPECIFIED PART OF RADIUS 09/26/2009  . FALL, HX OF 09/26/2009  . DEPRESSION 07/02/2009  . ESSENTIAL HYPERTENSION, BENIGN 07/02/2009  . BACK PAIN, CHRONIC 07/02/2009  . ALLERGIC RHINITIS CAUSE UNSPECIFIED 04/30/2009  . DM 04/26/2009  . CHF 04/26/2009     Encounter Diagnosis  Name Primary?  . Breast cancer, left Stage IA     PREVIOUS THERAPY: Left lumpectomy 04/22/2010. This was followed with partial breast radiation therapy, began tamoxifen November 2011.  CURRENT THERAPY: Tamoxifen 20 mg daily.  INTERIM HISTORY: Has multiple medical problems, no self detected problems or conditions in the breast. Chief complaint is side effects of anti-estrogen therapy, specifically hot flashes. She reports some 3-4 daily. No winter illnesses, did receive a flu shot. Complains of arthralgias/myalgias, possibly attributed to anti-estrogen therapy. Has concern over 10 pound weight gain since initiating tamoxifen. Has chronic sinus headaches, dyspnea with frequent exacerbations of asthma,  postnasal drip causes a cough. Bowel and bladder function are normal, appetite is good.  Last mammogram August 2012, normal by her report.  PHYSICAL EXAM: BP 104/64  Pulse 72  Temp(Src) 97.9 F (36.6 C) (Oral)  Ht 5' 1"  (1.549 m)  Wt 214 lb (97.07 kg)  BMI 40.44 kg/m2 General: Well developed,  well nourished, in no acute distress.  EENT: No ocular or oral lesions. No stomatitis.  Respiratory: Lungs are clear to auscultation bilaterally with normal respiratory movement and no accessory muscle use. Cardiac: No murmur, rub or tachycardia. No upper or lower extremity edema.  GI: Abdomen is soft, no palpable hepatosplenomegaly. No fluid wave. No tenderness. Musculoskeletal: No kyphosis, no tenderness over the spine, ribs or hips. Lymph: No cervical, infraclavicular, axillary or inguinal adenopathy. Neuro: No focal neurological deficits. Psych: Alert and oriented X 3, appropriate mood and affect.  BREAST EXAM: In the supine position, with the right arm over the head, the right nipple is everted. No periareolar edema or nipple discharge.Areola is chapped and peeling. No mass in any quadrant or subareolar region. No redness of the skin. No right axillary adenopathy. With the left arm over the head, the left nipple is everted. No periareolar edema or nipple discharge. Areola is chapped and peeling. No mass in any quadrant or subareolar region. No redness of the skin. No left axillary adenopathy.     LABORATORY STUDIES:   Results for orders placed in visit on 09/18/11  CBC WITH DIFFERENTIAL      Component Value Range   WBC 6.7  3.9 - 10.3 (10e3/uL)   NEUT# 3.8  1.5 - 6.5 (10e3/uL)   HGB 11.4 (*) 11.6 - 15.9 (g/dL)   HCT 34.4 (*) 34.8 - 46.6 (%)   Platelets 295  145 - 400 (10e3/uL)   MCV 89.5  79.5 - 101.0 (fL)  MCH 29.8  25.1 - 34.0 (pg)   MCHC 33.3  31.5 - 36.0 (g/dL)   RBC 3.84  3.70 - 5.45 (10e6/uL)   RDW 14.2  11.2 - 14.5 (%)   lymph# 2.3  0.9 - 3.3 (10e3/uL)   MONO# 0.4  0.1 - 0.9 (10e3/uL)   Eosinophils Absolute 0.1  0.0 - 0.5 (10e3/uL)   Basophils Absolute 0.1  0.0 - 0.1 (10e3/uL)   NEUT% 57.2  38.4 - 76.8 (%)   LYMPH% 34.0  14.0 - 49.7 (%)   MONO% 6.5  0.0 - 14.0 (%)   EOS% 1.4  0.0 - 7.0 (%)   BASO% 0.9  0.0 - 2.0 (%)  COMPREHENSIVE METABOLIC PANEL      Component Value  Range   Sodium 139  135 - 145 (mEq/L)   Potassium 5.0  3.5 - 5.3 (mEq/L)   Chloride 106  96 - 112 (mEq/L)   CO2 25  19 - 32 (mEq/L)   Glucose, Bld 96  70 - 99 (mg/dL)   BUN 17  6 - 23 (mg/dL)   Creatinine, Ser 0.76  0.50 - 1.10 (mg/dL)   Total Bilirubin 0.3  0.3 - 1.2 (mg/dL)   Alkaline Phosphatase 79  39 - 117 (U/L)   AST 19  0 - 37 (U/L)   ALT 17  0 - 35 (U/L)   Total Protein 6.3  6.0 - 8.3 (g/dL)   Albumin 3.8  3.5 - 5.2 (g/dL)   Calcium 8.7  8.4 - 10.5 (mg/dL)    IMPRESSION:  61 year old female with: 1. History stage IAa left breast cancer, August 2011, post lumpectomy, radiation therapy, now on tamoxifen. No evidence of recurrence. 2. Normal mammogram August 2012. 3. Multiple medical problems, managed by others. 4. Strong family history breast cancer, with sister and mother both with diagnosis of breast cancer  PLAN:   1. Following in CCN guidelines we will see her every 6 months to complete 5 years. She will return to clinic in 6 months to see Dr. Chancy Milroy. 2. Referral to genetics counselor for genetic risk assessment. 3. Continue Tamoxifen 20 mg/daily for total 5 years.

## 2011-10-01 ENCOUNTER — Other Ambulatory Visit: Payer: Self-pay | Admitting: *Deleted

## 2011-10-01 MED ORDER — LOSARTAN POTASSIUM 50 MG PO TABS: 50.0000 mg | ORAL_TABLET | Freq: Two times a day (BID) | ORAL | Status: DC

## 2011-10-01 MED ORDER — CARVEDILOL 6.25 MG PO TABS: 6.2500 mg | ORAL_TABLET | Freq: Two times a day (BID) | ORAL | Status: DC

## 2011-10-02 ENCOUNTER — Other Ambulatory Visit: Payer: Self-pay | Admitting: *Deleted

## 2011-10-02 MED ORDER — LOSARTAN POTASSIUM 50 MG PO TABS: 50.0000 mg | ORAL_TABLET | Freq: Two times a day (BID) | ORAL | Status: DC

## 2011-10-05 ENCOUNTER — Telehealth: Payer: Self-pay | Admitting: *Deleted

## 2011-10-05 DIAGNOSIS — I10 Essential (primary) hypertension: Secondary | ICD-10-CM

## 2011-10-05 DIAGNOSIS — E119 Type 2 diabetes mellitus without complications: Secondary | ICD-10-CM

## 2011-10-05 NOTE — Telephone Encounter (Signed)
Lab ordered.

## 2011-10-06 ENCOUNTER — Encounter: Payer: Self-pay | Admitting: Family Medicine

## 2011-10-06 ENCOUNTER — Ambulatory Visit (INDEPENDENT_AMBULATORY_CARE_PROVIDER_SITE_OTHER): Payer: Medicare FFS | Admitting: Family Medicine

## 2011-10-06 DIAGNOSIS — H9209 Otalgia, unspecified ear: Secondary | ICD-10-CM

## 2011-10-06 DIAGNOSIS — I10 Essential (primary) hypertension: Secondary | ICD-10-CM

## 2011-10-06 DIAGNOSIS — E119 Type 2 diabetes mellitus without complications: Secondary | ICD-10-CM

## 2011-10-06 DIAGNOSIS — E669 Obesity, unspecified: Secondary | ICD-10-CM

## 2011-10-06 LAB — POCT GLYCOSYLATED HEMOGLOBIN (HGB A1C): Hemoglobin A1C: 6.3

## 2011-10-06 LAB — BASIC METABOLIC PANEL WITH GFR
CO2: 23 mEq/L (ref 19–32)
Calcium: 9 mg/dL (ref 8.4–10.5)
Chloride: 105 mEq/L (ref 96–112)
Creat: 0.7 mg/dL (ref 0.50–1.10)
Glucose, Bld: 73 mg/dL (ref 70–99)

## 2011-10-06 MED ORDER — DULOXETINE HCL 60 MG PO CPEP: 60.0000 mg | ORAL_CAPSULE | Freq: Every day | ORAL | Status: DC

## 2011-10-06 NOTE — Progress Notes (Signed)
  Subjective:    Patient ID: Tanya Hogan, female    DOB: [birth year], 61 y.o.   MRN: [id number]  HPI Dsicuss her  weight. Got down to 200 lbs.  she has been trying to exercise. But can't lose past this.  Has been working out in the pool. She said she spoke with her neurologist she said that her calcium channel blocker and beta blocker may be keeping her from seeing her maximal workout potential.  Hypertension-she is taking her medications regularly. No chest pain or short of breath. She has been trying to exercise.  DM- Sugars have been running in upper 120s and low 130s. Up a little.  Taking meds regularluy. No inc thrist or urination.   Left ear feels sore for 2 weeks. Has had persistent ST as well. Some popping or cracking. No fever or cough. Wearing an ear plug helps.   Review of Systems     Objective:   Physical Exam  Constitutional: She is oriented to person, place, and time. She appears well-developed and well-nourished.  HENT:  Head: Normocephalic and atraumatic.  Right Ear: External ear normal.  Left Ear: External ear normal.  Nose: Nose normal.  Mouth/Throat: Oropharynx is clear and moist.       TMs and canals are clear.   Eyes: Conjunctivae and EOM are normal. Pupils are equal, round, and reactive to light.  Neck: Neck supple. No thyromegaly present.  Cardiovascular: Normal rate, regular rhythm and normal heart sounds.   Pulmonary/Chest: Effort normal and breath sounds normal. She has no wheezes.  Lymphadenopathy:    She has no cervical adenopathy.  Neurological: She is alert and oriented to person, place, and time.  Skin: Skin is warm and dry.  Psychiatric: She has a normal mood and affect.          Assessment & Plan:  Obesity - Encouraged to continue to workout.  At this point in time I do not think that we should stop or decrease her calcium to about beta blocker. Certainly they could be affecting her ability to workout maximally by not allowing her to get her  maximal heart rate but she definitely needs the best blood pressure control. Encouraged to workout maybe longer and to really work on her caloric intake. I offered to refer to nutritionist but she declined at this time.  DM- increase Lantus to 17 units based on slight elevation in sugars are the last couple of weeks.  She is doing well, A1c aT goal.  We tried to run a urine microalbumin today but unfortunately we are having problems with the strips that we have ordered. We will send the rest of the urine to the lab for urine microalbumin. Exam was performed and was abnormal. She already follows with neurology. Followup in 3 months.  Otalgia, left - Ear exam is normal continue allegra. Can use OTC pain drops if would like or can write rx. She declined.    HTN - at goal today. Followup in 6 months.

## 2011-10-06 NOTE — Telephone Encounter (Signed)
Quick Note:  All labs are normal. ______ 

## 2011-10-10 LAB — MICROALBUMIN / CREATININE URINE RATIO
Microalb Creat Ratio: 5.4 mg/g (ref 0.0–30.0)
Microalb, Ur: 0.65 mg/dL (ref 0.00–1.89)

## 2011-11-16 ENCOUNTER — Other Ambulatory Visit: Payer: Self-pay | Admitting: *Deleted

## 2011-11-16 MED ORDER — LEVOTHYROXINE SODIUM 75 MCG PO TABS: 75.0000 ug | ORAL_TABLET | Freq: Every day | ORAL | Status: DC

## 2011-11-17 ENCOUNTER — Other Ambulatory Visit: Payer: Self-pay | Admitting: *Deleted

## 2011-11-17 DIAGNOSIS — I5032 Chronic diastolic (congestive) heart failure: Secondary | ICD-10-CM

## 2011-11-17 MED ORDER — DILTIAZEM HCL ER COATED BEADS 120 MG PO CP24: ORAL_CAPSULE | ORAL | Status: DC

## 2011-12-10 ENCOUNTER — Other Ambulatory Visit: Payer: Self-pay | Admitting: *Deleted

## 2011-12-10 MED ORDER — PRAVASTATIN SODIUM 40 MG PO TABS: 40.0000 mg | ORAL_TABLET | Freq: Every day | ORAL | Status: DC

## 2011-12-10 MED ORDER — ESOMEPRAZOLE MAGNESIUM 40 MG PO CPDR: 40.0000 mg | DELAYED_RELEASE_CAPSULE | Freq: Every day | ORAL | Status: DC

## 2011-12-10 MED ORDER — LEVOTHYROXINE SODIUM 75 MCG PO TABS: 75.0000 ug | ORAL_TABLET | Freq: Every day | ORAL | Status: DC

## 2011-12-10 MED ORDER — DULOXETINE HCL 60 MG PO CPEP: 60.0000 mg | ORAL_CAPSULE | Freq: Every day | ORAL | Status: DC

## 2011-12-10 MED ORDER — GABAPENTIN 600 MG PO TABS: 600.0000 mg | ORAL_TABLET | Freq: Three times a day (TID) | ORAL | Status: DC

## 2012-01-05 ENCOUNTER — Encounter: Payer: Self-pay | Admitting: Family Medicine

## 2012-01-05 ENCOUNTER — Ambulatory Visit (INDEPENDENT_AMBULATORY_CARE_PROVIDER_SITE_OTHER): Payer: Medicare FFS | Admitting: Family Medicine

## 2012-01-05 DIAGNOSIS — E119 Type 2 diabetes mellitus without complications: Secondary | ICD-10-CM

## 2012-01-05 DIAGNOSIS — Z808 Family history of malignant neoplasm of other organs or systems: Secondary | ICD-10-CM

## 2012-01-05 DIAGNOSIS — I10 Essential (primary) hypertension: Secondary | ICD-10-CM

## 2012-01-05 LAB — POCT GLYCOSYLATED HEMOGLOBIN (HGB A1C): Hemoglobin A1C: 6.8

## 2012-01-05 NOTE — Progress Notes (Signed)
  Subjective:    Patient ID: Tanya Hogan, female    DOB: 1951-01-24, 61 y.o.   MRN: [id number]  HPI DM- not exercising. Has gained 5 lbs.  She plans on starting the virgin diet.  Glucose 135-99.  No CP or SOB.  Due for cholesterol checked.  Has had pain in her feet from neuropahty.  No hypoglycemic events.   Allergies - Had bronchitis recently.   HTN - dong well. Taking her meds regularly. She is still very concnerned about her BP meds causing fatigue and not allowing her to execise to her max potential but right now not exercising at all   Review of Systems     Objective:   Physical Exam  Constitutional: She is oriented to person, place, and time. She appears well-developed and well-nourished.  HENT:  Head: Normocephalic and atraumatic.  Cardiovascular: Normal rate, regular rhythm and normal heart sounds.   Pulmonary/Chest: Effort normal and breath sounds normal.  Neurological: She is alert and oriented to person, place, and time.  Skin: Skin is warm and dry.  Psychiatric: She has a normal mood and affect. Her behavior is normal.          Assessment & Plan:  DM- Well controlled.  Due for CMP and lipids.  Lab Results  Component Value Date   HGBA1C 6.8 01/05/2012   HTN - WEll controlled. Discussed rial of bystolic 88m for one month in place of teh carvedilol to see if improvnes her fatigue and inability to push her exercise level. Though has to work out consistantly if she wants to reach her weight goals.    Mother with hx of melanoma- I rec yearly skin check with dermatology. Wil refer.

## 2012-01-05 NOTE — Patient Instructions (Addendum)
Take the bystolic 5 mg once a day in place of your carvedilol.   Work on regular exercise.

## 2012-01-07 LAB — COMPLETE METABOLIC PANEL WITH GFR
ALT: 17 U/L (ref 0–35)
AST: 19 U/L (ref 0–37)
Albumin: 4 g/dL (ref 3.5–5.2)
Alkaline Phosphatase: 81 U/L (ref 39–117)
GFR, Est Non African American: >89 mL/min
Potassium: 5.4 mEq/L — ABNORMAL HIGH (ref 3.5–5.3)
Sodium: 140 mEq/L (ref 135–145)
Total Bilirubin: 0.3 mg/dL (ref 0.3–1.2)
Total Protein: 6.5 g/dL (ref 6.0–8.3)

## 2012-01-07 LAB — LIPID PANEL
HDL: 69 mg/dL (ref >39)
Total CHOL/HDL Ratio: 2.5 Ratio
VLDL: 19 mg/dL (ref 0–40)

## 2012-01-11 ENCOUNTER — Telehealth: Payer: Self-pay | Admitting: *Deleted

## 2012-01-11 DIAGNOSIS — E875 Hyperkalemia: Secondary | ICD-10-CM

## 2012-01-11 NOTE — Telephone Encounter (Signed)
Potassium lab entered

## 2012-01-12 LAB — POTASSIUM: Potassium: 5.4 mEq/L — ABNORMAL HIGH (ref 3.5–5.3)

## 2012-01-19 ENCOUNTER — Telehealth: Payer: Self-pay | Admitting: *Deleted

## 2012-01-19 DIAGNOSIS — E875 Hyperkalemia: Secondary | ICD-10-CM

## 2012-01-19 NOTE — Telephone Encounter (Signed)
Pt states that the samples you gave her at the visit are working well. She hopes insurance will cover them.

## 2012-01-19 NOTE — Telephone Encounter (Signed)
Okay to send prescription for medication. I put the medicine and I wasn't sure she wanted it sent to her mail order or locally to [place]. We can also check the Cymbalta to see if we have a coupon cards off the Bystolic. Glad she is tolerating it well.

## 2012-01-20 MED ORDER — NEBIVOLOL HCL 5 MG PO TABS: 5.0000 mg | ORAL_TABLET | Freq: Every day | ORAL | Status: DC

## 2012-01-20 NOTE — Telephone Encounter (Signed)
LM for pt to return call to see what pharmacy to send her medication to.

## 2012-02-15 ENCOUNTER — Ambulatory Visit (INDEPENDENT_AMBULATORY_CARE_PROVIDER_SITE_OTHER): Payer: Medicare FFS | Admitting: Physician Assistant

## 2012-02-15 ENCOUNTER — Other Ambulatory Visit: Payer: Self-pay | Admitting: *Deleted

## 2012-02-15 VITALS — BP 112/66 | HR 88

## 2012-02-15 DIAGNOSIS — I10 Essential (primary) hypertension: Secondary | ICD-10-CM

## 2012-02-15 MED ORDER — BUMETANIDE 1 MG PO TABS: 1.0000 mg | ORAL_TABLET | Freq: Two times a day (BID) | ORAL | Status: DC

## 2012-02-15 NOTE — Progress Notes (Signed)
  Subjective:    Patient ID: Tanya Hogan, female    DOB: 01/13/1951, 61 y.o.   MRN: [id number] BP check. Pt is asking when you would like for her to f/u.  HPI    Review of Systems     Objective:   Physical Exam        Assessment & Plan:  Patient's blood pressure is great today. Meds were changed to see if they helped with fatigued. Recheck with Dr. Madilyn Fireman in 3 months. Iran Planas PA-C

## 2012-03-11 ENCOUNTER — Other Ambulatory Visit: Payer: Self-pay | Admitting: Family Medicine

## 2012-03-11 DIAGNOSIS — Z853 Personal history of malignant neoplasm of breast: Secondary | ICD-10-CM

## 2012-03-17 ENCOUNTER — Ambulatory Visit (HOSPITAL_BASED_OUTPATIENT_CLINIC_OR_DEPARTMENT_OTHER): Payer: Medicare FFS | Admitting: Oncology

## 2012-03-17 ENCOUNTER — Other Ambulatory Visit (HOSPITAL_BASED_OUTPATIENT_CLINIC_OR_DEPARTMENT_OTHER): Payer: Medicare FFS | Admitting: Lab

## 2012-03-17 ENCOUNTER — Encounter: Payer: Self-pay | Admitting: Oncology

## 2012-03-17 ENCOUNTER — Telehealth: Payer: Self-pay | Admitting: Oncology

## 2012-03-17 VITALS — BP 105/61 | HR 69 | Temp 97.3°F | Ht 62.0 in | Wt 212.8 lb

## 2012-03-17 DIAGNOSIS — C50919 Malignant neoplasm of unspecified site of unspecified female breast: Secondary | ICD-10-CM

## 2012-03-17 LAB — CBC WITH DIFFERENTIAL/PLATELET
BASO%: 0.6 % (ref 0.0–2.0)
Basophils Absolute: 0 10*3/uL (ref 0.0–0.1)
EOS%: 1.5 % (ref 0.0–7.0)
HCT: 36.7 % (ref 34.8–46.6)
HGB: 12.1 g/dL (ref 11.6–15.9)
LYMPH%: 35.5 % (ref 14.0–49.7)
MCH: 29.6 pg (ref 25.1–34.0)
MCHC: 32.9 g/dL (ref 31.5–36.0)
MCV: 90.2 fL (ref 79.5–101.0)
NEUT%: 54 % (ref 38.4–76.8)
Platelets: 291 10*3/uL (ref 145–400)

## 2012-03-17 LAB — COMPREHENSIVE METABOLIC PANEL
ALT: 17 U/L (ref 0–35)
AST: 19 U/L (ref 0–37)
BUN: 12 mg/dL (ref 6–23)
Calcium: 8.8 mg/dL (ref 8.4–10.5)
Creatinine, Ser: 0.74 mg/dL (ref 0.50–1.10)
Total Bilirubin: 0.3 mg/dL (ref 0.3–1.2)

## 2012-03-17 NOTE — Patient Instructions (Addendum)
Doing well.  I will see you back in 6 months

## 2012-03-17 NOTE — Progress Notes (Signed)
OFFICE PROGRESS NOTE  CC  [name],[name], MD [address]  [address]  [place] [address] Dr. Fanny [address] Dr. Gery Pray Dr. Loyal Gambler  DIAGNOSIS: 61 year old female with stage I invasive ductal carcinoma measuring 0.5 cm ER positive PR positive HER-2/neu negative diagnosed July 2011.  PRIOR THERAPY:  #1 patient initially underwent a lumpectomy of the left breast 04/22/2010. The final pathology revealed a 0.5 cm invasive ductal carcinoma that was estrogen receptor positive progesterone receptor positive HER-2/neu negative.  #2 she then went on to have partial breast irradiation.  #3 in November 2011 patient began tamoxifen 20 mg daily. Total of 5 years of therapy is planned.  CURRENT THERAPY:Tamoxifen 20 mg daily  INTERVAL HISTORY: Tanya Hogan 61 y.o. female returns for Followup visit today. She is being seen every 6 months. Her last visit was with the nurse practitioner. Clinically she seems to be doing well she is however experiencing hot flashes with the tamoxifen. She describes them as being "evil". She has about 2 episodes a day. His last about one hour each. Otherwise she denies having any aches pains. She does have some blurring of vision she is seeing an ophthalmologist once a year and I have recommended that she be seen again do to change in her vision. She denies having vaginal discharge or bleeding. She also complains of having early morning incontinence. She does have an overactive bladder. I have recommended that she set up alarm clock halfway in the night so that she gets up and empties her bladder so she does not run into problems with incontinence early in the morning. She has no nausea no vomiting no fevers chills. Remainder of the 10 point review of systems is unremarkable.  MEDICAL HISTORY: Past Medical History  Diagnosis Date  . HTN (hypertension)   . CHF (congestive heart failure)   . LBBB (left bundle branch block)   . Fibromyalgia   .  Alkaline phosphatase elevation   . Hypothyroidism   . Closed fracture of unspecified part of radius (alone)   . Depression   . DDD (degenerative disc disease), lumbar   . Carpal tunnel syndrome   . IBS (irritable bowel syndrome)   . Breast cancer     ALLERGIES:  is allergic to baking soda-fluoride; clindamycin; codeine; penicillins; quinapril hcl; quinine; rifampin; and sulfonamide derivatives.  MEDICATIONS:  Current Outpatient Prescriptions  Medication Sig Dispense Refill  . acetaminophen (ARTHRITIS PAIN RELIEF) 650 MG CR tablet Take 650 mg by mouth every 8 (eight) hours as needed.        [name] albuterol (PROAIR HFA) 108 (90 BASE) MCG/ACT inhaler Inhale 2 puffs into the lungs every 6 (six) hours as needed.        . bumetanide (BUMEX) 1 MG tablet Take 1 tablet (1 mg total) by mouth 2 (two) times daily.  180 tablet  1  . Calcium Carb-Cholecalciferol (CALCIUM PLUS VITAMIN D3 PO) Take 2 tablets by mouth daily.        . cycloSPORINE (RESTASIS) 0.05 % ophthalmic emulsion 1 drop 2 (two) times daily.        [name] diltiazem (DILT-CD) 120 MG 24 hr capsule ONE TABLET IN THE AM AND TWO TABLETS IN THE PM  270 capsule  4  . DULoxetine (CYMBALTA) 60 MG capsule Take 1 capsule (60 mg total) by mouth daily.  90 capsule  2  . esomeprazole (NEXIUM) 40 MG capsule Take 1 capsule (40 mg total) by mouth daily before breakfast.  90  capsule  2  . fexofenadine (ALLEGRA) 180 MG tablet Take 180 mg by mouth 2 (two) times daily.       . Fluticasone-Salmeterol (ADVAIR DISKUS) 250-50 MCG/DOSE AEPB Inhale 2 puffs into the lungs every 12 (twelve) hours.        . gabapentin (NEURONTIN) 600 MG tablet Take 1 tablet (600 mg total) by mouth 3 (three) times daily. 3 tabs po tid  270 tablet  2  . glucosamine-chondroitin 500-400 MG tablet Take 1 tablet by mouth 2 (two) times daily.      . hyoscyamine (LEVSIN, ANASPAZ) 0.125 MG tablet Take 0.125 mg by mouth every 4 (four) hours as needed.        . insulin glargine (LANTUS SOLOSTAR) 100  UNIT/ML injection Inject 17 Units into the skin at bedtime.       [name] levothyroxine (SYNTHROID, LEVOTHROID) 75 MCG tablet Take 1 tablet (75 mcg total) by mouth daily.  90 tablet  2  . losartan (COZAAR) 50 MG tablet Take 1 tablet (50 mg total) by mouth 2 (two) times daily.  180 tablet  1  . montelukast (SINGULAIR) 10 MG tablet Take 10 mg by mouth at bedtime.        . Multiple Vitamin (MULTIVITAMIN) tablet Take 1 tablet by mouth daily.        . Multiple Vitamins-Minerals (OCUVITE ADULT 50+) CAPS Take 1 capsule by mouth daily.        . nebivolol (BYSTOLIC) 5 MG tablet Take 1 tablet (5 mg total) by mouth daily.  90 tablet  1  . polyethylene glycol (MIRALAX / GLYCOLAX) packet Take 17 g by mouth daily.        . pravastatin (PRAVACHOL) 40 MG tablet Take 1 tablet (40 mg total) by mouth daily.  90 tablet  2  . Probiotic Product (RESTORA PO) Take 1 tablet by mouth.      . tamoxifen (NOLVADEX) 20 MG tablet Take 20 mg by mouth daily.        [name] tiZANidine (ZANAFLEX) 2 MG tablet Take 2 mg by mouth 2 (two) times daily.          SURGICAL HISTORY:  Past Surgical History  Procedure Date  . Pacemaker placement 2009    [facility] cardiology  . Knee surgery   . Foot surgery   . Tubal ligation   . Breast lumpectomy     REVIEW OF SYSTEMS:  Pertinent items are noted in HPI.   PHYSICAL EXAMINATION: General appearance: alert, cooperative and appears stated age Lymph nodes: Cervical, supraclavicular, and axillary nodes normal. Resp: clear to auscultation bilaterally and normal percussion bilaterally Back: symmetric, no curvature. ROM normal. No CVA tenderness. Cardio: regular rate and rhythm, S1, S2 normal, no murmur, click, rub or gallop GI: soft, non-tender; bowel sounds normal; no masses,  no organomegaly Extremities: extremities normal, atraumatic, no cyanosis or edema Neurologic: Grossly normal Bilateral breast examination: Right breast no masses nipple discharge or skin changes no nipple inversion or a  portion. Left breast reveals a well-healed incisional scar there is no fullness no skin changes no tenderness. No palpable masses. No nipple discharge or inversion or retraction ECOG PERFORMANCE STATUS: 1 - Symptomatic but completely ambulatory  Blood pressure 105/61, pulse 69, temperature 97.3 F (36.3 C), temperature source Oral, height 5' 2"  (1.575 m), weight 212 lb 12.8 oz (96.525 kg).  LABORATORY DATA: Lab Results  Component Value Date   WBC 5.9 03/17/2012   HGB 12.1 03/17/2012   HCT 36.7 03/17/2012  MCV 90.2 03/17/2012   PLT 291 03/17/2012      Chemistry      Component Value Date/Time   NA 140 01/05/2012 1137   K 5.1 01/19/2012 1434   CL 110 01/05/2012 1137   CO2 23 01/05/2012 1137   BUN 13 01/05/2012 1137   CREATININE 0.69 01/05/2012 1137   CREATININE 0.76 09/18/2011 0940      Component Value Date/Time   CALCIUM 9.3 01/05/2012 1137   ALKPHOS 81 01/05/2012 1137   AST 19 01/05/2012 1137   ALT 17 01/05/2012 1137   BILITOT 0.3 01/05/2012 1137       RADIOGRAPHIC STUDIES:  No results found.  ASSESSMENT: 61 year old female with  #1 stage I invasive ductal carcinoma measuring 0.5 cm ER positive PR positive HER-2/neu negative. Patient is status post lumpectomy 04/22/2010 followed by partial breast radiation. She was then begun on adjuvant tamoxifen 20 mg daily overall she seems to be doing well her hot flashes are stable.  #2 urge incontinence especially in the morning   PLAN:   #1 patient will continue tamoxifen 20 mg daily I think overall she's doing well with it except for the hot flashes. Total of 5 years of therapy is planned.  #2 urgent continence emptying of bladder frequently is recommended and she is also recommended to speak to her PCP.  #3 I will plan on seeing the patient back  In 6 months time or certainly I. Can see her back sooner if need arises.   All questions were answered. The patient knows to call the clinic with any problems, questions or concerns. We can  certainly see the patient much sooner if necessary.  I spent 20 minutes counseling the patient face to face. The total time spent in the appointment was 30 minutes.    Marcy Panning, MD Medical/Oncology [facility] [phone] (beeper) [phone] (Office)  03/17/2012, 10:46 AM

## 2012-03-17 NOTE — Telephone Encounter (Signed)
gve the pt her jan 2014 appt calendar

## 2012-03-25 ENCOUNTER — Encounter: Payer: Self-pay | Admitting: Cardiology

## 2012-03-25 ENCOUNTER — Ambulatory Visit (INDEPENDENT_AMBULATORY_CARE_PROVIDER_SITE_OTHER): Payer: Medicare FFS | Admitting: Cardiology

## 2012-03-25 VITALS — BP 126/75 | HR 67 | Wt 210.0 lb

## 2012-03-25 DIAGNOSIS — I10 Essential (primary) hypertension: Secondary | ICD-10-CM

## 2012-03-25 MED ORDER — DILTIAZEM HCL ER COATED BEADS 120 MG PO CP24: 120.0000 mg | ORAL_CAPSULE | Freq: Two times a day (BID) | ORAL | Status: DC

## 2012-03-25 MED ORDER — NEBIVOLOL HCL 5 MG PO TABS: 5.0000 mg | ORAL_TABLET | Freq: Every day | ORAL | Status: DC

## 2012-03-25 NOTE — Assessment & Plan Note (Signed)
Management per electrophysiology. 

## 2012-03-25 NOTE — Assessment & Plan Note (Signed)
Patient states her Cardizem is causing fatigue and she would like to decrease to twice a day dosing. She is also concerned about hyperkalemia with Cozaar and she feels it is contributing to her weakness as well. She would like to discontinue that medication. We will otherwise continue her remaining antihypertensives. She will follow her blood pressure and we will adjust based on followup readings.

## 2012-03-25 NOTE — Patient Instructions (Addendum)
Stop cozaar.  Decrease Diltiazem CD to 16m twice a day.  Your physician wants you to follow-up in: 6 months with Dr CStanford Breed  You will receive a reminder letter in the mail two months in advance. If you don't receive a letter, please call our office to schedule the follow-up appointment.

## 2012-03-25 NOTE — Assessment & Plan Note (Signed)
Patient euvolemic on examination. Continue present medications.

## 2012-03-25 NOTE — Assessment & Plan Note (Signed)
Management per primary care. 

## 2012-03-25 NOTE — Progress Notes (Signed)
HPI: Pleasant female with past medical history of congestive heart failure for F/U. Last Echo in April of 2011 revealed normal LV function, mild left atrial enlargement and a trivial pericardial effusion. Patient also has a history of pacemaker. I last saw her in March of 2012. Since then, she does have some dyspnea on exertion but improved. She denies orthopnea, PND, pedal edema, chest pain or syncope. She is complaining of weakness and fatigue. She attributes this to her blood pressure medications and wishes to change her Cardizem to twice a day. She also wants to discontinue her Cozaar.   Current Outpatient Prescriptions  Medication Sig Dispense Refill  . acetaminophen (ARTHRITIS PAIN RELIEF) 650 MG CR tablet Take 650 mg by mouth every 8 (eight) hours as needed.        [name] albuterol (PROAIR HFA) 108 (90 BASE) MCG/ACT inhaler Inhale 2 puffs into the lungs every 6 (six) hours as needed.        . bumetanide (BUMEX) 1 MG tablet Take 1 tablet (1 mg total) by mouth 2 (two) times daily.  180 tablet  1  . Calcium Carb-Cholecalciferol (CALCIUM PLUS VITAMIN D3 PO) Take 2 tablets by mouth daily.        . cycloSPORINE (RESTASIS) 0.05 % ophthalmic emulsion 1 drop 2 (two) times daily.        . DULoxetine (CYMBALTA) 60 MG capsule Take 1 capsule (60 mg total) by mouth daily.  90 capsule  2  . esomeprazole (NEXIUM) 40 MG capsule Take 1 capsule (40 mg total) by mouth daily before breakfast.  90 capsule  2  . Fluticasone-Salmeterol (ADVAIR DISKUS) 250-50 MCG/DOSE AEPB Inhale 2 puffs into the lungs every 12 (twelve) hours.        . gabapentin (NEURONTIN) 600 MG tablet Take 1 tablet (600 mg total) by mouth 3 (three) times daily. 3 tabs po tid  270 tablet  2  . glucosamine-chondroitin 500-400 MG tablet Take 1 tablet by mouth 2 (two) times daily.      . hyoscyamine (LEVSIN, ANASPAZ) 0.125 MG tablet Take 0.125 mg by mouth every 4 (four) hours as needed.        . insulin glargine (LANTUS SOLOSTAR) 100 UNIT/ML injection  Inject 18 Units into the skin at bedtime.       [name] levothyroxine (SYNTHROID, LEVOTHROID) 75 MCG tablet Take 1 tablet (75 mcg total) by mouth daily.  90 tablet  2  . losartan (COZAAR) 50 MG tablet Take 50 mg by mouth daily.      . montelukast (SINGULAIR) 10 MG tablet Take 10 mg by mouth at bedtime.        . Multiple Vitamin (MULTIVITAMIN) tablet Take 1 tablet by mouth daily.        . nebivolol (BYSTOLIC) 5 MG tablet Take 1 tablet (5 mg total) by mouth daily.  90 tablet  1  . polyethylene glycol (MIRALAX / GLYCOLAX) packet Take 17 g by mouth daily.        . pravastatin (PRAVACHOL) 40 MG tablet Take 1 tablet (40 mg total) by mouth daily.  90 tablet  2  . Probiotic Product (RESTORA PO) Take 1 tablet by mouth.      . tamoxifen (NOLVADEX) 20 MG tablet Take 20 mg by mouth daily.        [name] tiZANidine (ZANAFLEX) 2 MG tablet Take 2 mg by mouth 2 (two) times daily.        [name] DISCONTD: diltiazem (DILT-CD) 120 MG 24 hr capsule ONE  TABLET IN THE AM AND TWO TABLETS IN THE PM  270 capsule  4  . DISCONTD: losartan (COZAAR) 50 MG tablet Take 1 tablet (50 mg total) by mouth 2 (two) times daily.  180 tablet  1     Past Medical History  Diagnosis Date  . HTN (hypertension)   . CHF (congestive heart failure)   . LBBB (left bundle branch block)   . Fibromyalgia   . Alkaline phosphatase elevation   . Hypothyroidism   . Closed fracture of unspecified part of radius (alone)   . Depression   . DDD (degenerative disc disease), lumbar   . Carpal tunnel syndrome   . IBS (irritable bowel syndrome)   . Breast cancer     Past Surgical History  Procedure Date  . Pacemaker placement 2009    [facility] cardiology  . Knee surgery   . Foot surgery   . Tubal ligation   . Breast lumpectomy     History   Social History  . Marital Status: Divorced    Spouse Name: N/A    Number of Children: N/A  . Years of Education: N/A   Occupational History  . Not on file.   Social History Main Topics  . Smoking status:  Former Smoker    Quit date: 08/24/1980  . Smokeless tobacco: Not on file  . Alcohol Use: No  . Drug Use: No  . Sexually Active: Not Currently   Other Topics Concern  . Not on file   Social History Narrative  . No narrative on file    ROS: fatigue and weakness but no fevers or chills, productive cough, hemoptysis, dysphasia, odynophagia, melena, hematochezia, dysuria, hematuria, rash, seizure activity, orthopnea, PND, pedal edema, claudication. Remaining systems are negative.  Physical Exam: Well-developed well-nourished in no acute distress.  Skin is warm and dry.  HEENT is normal.  Neck is supple.  Chest is clear to auscultation with normal expansion.  Cardiovascular exam is regular rate and rhythm.  Abdominal exam nontender or distended. No masses palpated. Extremities show no edema. neuro grossly intact  ECG sinus rhythm with ventricular pacing.

## 2012-04-04 ENCOUNTER — Ambulatory Visit
Admission: RE | Admit: 2012-04-04 | Discharge: 2012-04-04 | Disposition: A | Discharge status: Home / Self Care | Payer: Medicare PPO | Source: Ambulatory Visit | Attending: Family Medicine | Admitting: Family Medicine

## 2012-04-04 DIAGNOSIS — Z853 Personal history of malignant neoplasm of breast: Secondary | ICD-10-CM

## 2012-04-12 ENCOUNTER — Encounter: Payer: Self-pay | Admitting: Internal Medicine

## 2012-04-12 ENCOUNTER — Ambulatory Visit (INDEPENDENT_AMBULATORY_CARE_PROVIDER_SITE_OTHER): Payer: Medicare PPO | Admitting: Internal Medicine

## 2012-04-12 VITALS — BP 106/66 | HR 78 | Ht 63.75 in | Wt 210.4 lb

## 2012-04-12 DIAGNOSIS — I10 Essential (primary) hypertension: Secondary | ICD-10-CM

## 2012-04-12 DIAGNOSIS — Z950 Presence of cardiac pacemaker: Secondary | ICD-10-CM

## 2012-04-12 DIAGNOSIS — I509 Heart failure, unspecified: Secondary | ICD-10-CM

## 2012-04-12 LAB — PACEMAKER DEVICE OBSERVATION
AL AMPLITUDE: 2.8 mv
ATRIAL PACING PM: 16
BAMS-0001: 150 {beats}/min
LV LEAD THRESHOLD: 1 V
VENTRICULAR PACING PM: 100

## 2012-04-12 NOTE — Assessment & Plan Note (Signed)
Her biventricular pacemaker is working normally. We'll plan to recheck in several months.

## 2012-04-12 NOTE — Assessment & Plan Note (Signed)
Her blood pressure is on the low side. I've asked the patient to reduce her Cardizem 220 mg daily. She will continue a low-sodium diet. She will continue her beta blocker.

## 2012-04-12 NOTE — Progress Notes (Signed)
HPI Tanya Hogan returns today for followup. She is pleasant 61 year old woman with a history of obesity, hypertension, chronic systolic heart failure, status post biventricular pacemaker insertion. She denies chest pain, shortness of breath, or peripheral edema. No syncope. She is been bothered by low blood pressure. She was unable take ACE inhibitors or ARB's secondary to hyperkalemia. Allergies  Allergen Reactions  . Baking Soda-Fluoride (Sodium Fluoride) Itching  . Clindamycin   . Codeine   . Penicillins   . Quinapril Hcl   . Quinine   . Rifampin   . Sulfonamide Derivatives      Current Outpatient Prescriptions  Medication Sig Dispense Refill  . acetaminophen (ARTHRITIS PAIN RELIEF) 650 MG CR tablet Take 650 mg by mouth every 8 (eight) hours as needed.        [name] albuterol (PROAIR HFA) 108 (90 BASE) MCG/ACT inhaler Inhale 2 puffs into the lungs every 6 (six) hours as needed.        . bumetanide (BUMEX) 1 MG tablet Take 1 tablet (1 mg total) by mouth 2 (two) times daily.  180 tablet  1  . Calcium Carb-Cholecalciferol (CALCIUM PLUS VITAMIN D3 PO) Take 2 tablets by mouth daily.        . cycloSPORINE (RESTASIS) 0.05 % ophthalmic emulsion 1 drop 2 (two) times daily.        [name] diltiazem (CARDIZEM CD) 120 MG 24 hr capsule Take 1 capsule (120 mg total) by mouth 2 (two) times daily.  180 capsule  3  . DULoxetine (CYMBALTA) 60 MG capsule Take 1 capsule (60 mg total) by mouth daily.  90 capsule  2  . esomeprazole (NEXIUM) 40 MG capsule Take 1 capsule (40 mg total) by mouth daily before breakfast.  90 capsule  2  . fexofenadine (ALLEGRA) 180 MG tablet Take 180 mg by mouth as needed.      . Fluticasone-Salmeterol (ADVAIR DISKUS) 250-50 MCG/DOSE AEPB Inhale 2 puffs into the lungs every 12 (twelve) hours.        . gabapentin (NEURONTIN) 600 MG tablet Take 1 tablet (600 mg total) by mouth 3 (three) times daily. 3 tabs po tid  270 tablet  2  . glucosamine-chondroitin 500-400 MG tablet Take 1 tablet by  mouth 2 (two) times daily.      . hyoscyamine (LEVSIN, ANASPAZ) 0.125 MG tablet Take 0.125 mg by mouth every 4 (four) hours as needed.        . insulin glargine (LANTUS SOLOSTAR) 100 UNIT/ML injection Inject 18 Units into the skin at bedtime.       [name] levothyroxine (SYNTHROID, LEVOTHROID) 75 MCG tablet Take 1 tablet (75 mcg total) by mouth daily.  90 tablet  2  . montelukast (SINGULAIR) 10 MG tablet Take 10 mg by mouth at bedtime.        . Multiple Vitamin (MULTIVITAMIN) tablet Take 1 tablet by mouth daily.        . nebivolol (BYSTOLIC) 5 MG tablet Take 1 tablet (5 mg total) by mouth daily.  90 tablet  3  . polyethylene glycol (MIRALAX / GLYCOLAX) packet Take 17 g by mouth daily.        . pravastatin (PRAVACHOL) 40 MG tablet Take 1 tablet (40 mg total) by mouth daily.  90 tablet  2  . Probiotic Product (RESTORA PO) Take 1 tablet by mouth.      . tamoxifen (NOLVADEX) 20 MG tablet Take 20 mg by mouth daily.        . Wheat Dextrin (EQ  FIBER POWDER PO) Take 10 mLs by mouth 3 (three) times daily.         Past Medical History  Diagnosis Date  . HTN (hypertension)   . CHF (congestive heart failure)   . LBBB (left bundle branch block)   . Fibromyalgia   . Alkaline phosphatase elevation   . Hypothyroidism   . Closed fracture of unspecified part of radius (alone)   . Depression   . DDD (degenerative disc disease), lumbar   . Carpal tunnel syndrome   . IBS (irritable bowel syndrome)   . Breast cancer     ROS:   All systems reviewed and negative except as noted in the HPI.   Past Surgical History  Procedure Date  . Pacemaker placement 2009    [facility] cardiology  . Knee surgery   . Foot surgery   . Tubal ligation   . Breast lumpectomy      Family History  Problem Relation Age of Onset  . Heart failure    . Breast cancer    . Hypertension    . Melanoma Mother      History   Social History  . Marital Status: Divorced    Spouse Name: N/A    Number of Children: N/A  .  Years of Education: N/A   Occupational History  . Not on file.   Social History Main Topics  . Smoking status: Former Smoker    Quit date: 08/24/1966  . Smokeless tobacco: Not on file  . Alcohol Use: No  . Drug Use: No  . Sexually Active: Not Currently   Other Topics Concern  . Not on file   Social History Narrative  . No narrative on file     BP 106/66  Pulse 78  Ht 5' 3.75" (1.619 m)  Wt 210 lb 6.4 oz (95.437 kg)  BMI 36.40 kg/m2  Physical Exam:  Well appearing NAD HEENT: Unremarkable Neck:  No JVD, no thyromegally Lungs:  Clear with no wheezes, rales, or rhonchi. HEART:  Regular rate rhythm, no murmurs, no rubs, no clicks Abd:  soft, positive bowel sounds, no organomegally, no rebound, no guarding Ext:  2 plus pulses, no edema, no cyanosis, no clubbing Skin:  No rashes no nodules Neuro:  CN II through XII intact, motor grossly intact  DEVICE  Normal device function.  See PaceArt for details.   Assess/Plan:

## 2012-04-12 NOTE — Patient Instructions (Signed)
Your physician wants you to follow-up in: 6 months with device clinic and 12 months with Dr Knox Saliva will receive a reminder letter in the mail two months in advance. If you don't receive a letter, please call our office to schedule the follow-up appointment.

## 2012-04-12 NOTE — Assessment & Plan Note (Signed)
Her chronic systolic heart failure is well compensated. She will continue her current medical therapy and maintain a low-sodium diet.

## 2012-04-29 ENCOUNTER — Other Ambulatory Visit: Payer: Self-pay | Admitting: Family Medicine

## 2012-05-10 ENCOUNTER — Ambulatory Visit (INDEPENDENT_AMBULATORY_CARE_PROVIDER_SITE_OTHER): Payer: Medicare PPO | Admitting: Family Medicine

## 2012-05-10 ENCOUNTER — Encounter: Payer: Self-pay | Admitting: Family Medicine

## 2012-05-10 VITALS — BP 143/66 | HR 50 | Wt 207.0 lb

## 2012-05-10 DIAGNOSIS — E119 Type 2 diabetes mellitus without complications: Secondary | ICD-10-CM

## 2012-05-10 DIAGNOSIS — Z23 Encounter for immunization: Secondary | ICD-10-CM

## 2012-05-10 DIAGNOSIS — L509 Urticaria, unspecified: Secondary | ICD-10-CM

## 2012-05-10 DIAGNOSIS — I10 Essential (primary) hypertension: Secondary | ICD-10-CM

## 2012-05-10 LAB — POCT GLYCOSYLATED HEMOGLOBIN (HGB A1C): Hemoglobin A1C: 6.3

## 2012-05-10 MED ORDER — NEBIVOLOL HCL 10 MG PO TABS: 10.0000 mg | ORAL_TABLET | Freq: Every day | ORAL | Status: DC

## 2012-05-10 MED ORDER — TRIAMCINOLONE ACETONIDE 40 MG/ML IJ SUSP: 40.0000 mg | Freq: Once | INTRAMUSCULAR | Status: DC

## 2012-05-10 NOTE — Progress Notes (Signed)
  Subjective:    Patient ID: Tanya Hogan, female    DOB: 1951-05-23, 61 y.o.   MRN: [id number]  HPI 2-3 weeks ago broke out in hives on her abdomen. Went to allergy doc but they didn't want to give her steroids bc of her diabetes.  She is using a steroid cream but says using it all over as well. Continuing her allergy pills. Now rash on on her arms and face.    HTN - Stopped her diltiazem.  She is now on bystolic.   DM- Sugars are uncder 100 fasting.  No hypoglycemic events.  NO cuts or sores that arent' healing well.  She's on 18 units of Lantus. No regular exercise.  Review of Systems     Objective:   Physical Exam  Constitutional: She is oriented to person, place, and time. She appears well-developed and well-nourished.  HENT:  Head: Normocephalic and atraumatic.  Cardiovascular: Normal rate, regular rhythm and normal heart sounds.   Pulmonary/Chest: Effort normal and breath sounds normal.  Neurological: She is alert and oriented to person, place, and time.  Skin: Skin is warm and dry.       Hives on her arm, face, abdomen.  Torso.   Psychiatric: She has a normal mood and affect. Her behavior is normal.          Assessment & Plan:  Hives - Will give steroid injection. Kenalog 76m IM for acute and prolonged relief.   She can increase her insulin as needed for her sugars. Call the office if she's having any problems regulating her sugars with her insulin or has questions. Keep followup appointment with allergy partners.  HTN - Not well controlled.  Will increase bystolic to 117OH F/U in one month.    DM- doing very well. A1c is at goal. She's doing fantastic. Continue current regimen. Followup in 3-4 months. She is on 18 units of Lantus. Lab Results  Component Value Date   HGBA1C 6.3 05/10/2012

## 2012-05-17 ENCOUNTER — Telehealth: Payer: Self-pay | Admitting: *Deleted

## 2012-05-17 NOTE — Telephone Encounter (Signed)
Pt states that yesterday when she went to her allergist that her BP was 90/55 and she had taken Bystolic 29m and a fluid pill. She states she only took 144HQBystolic for 2 days b/c it made her feel bad and she couldn't get up. States today the systolic number is 1759and she didn't remember the bottom number. Please advise.

## 2012-05-17 NOTE — Telephone Encounter (Signed)
Pt states she will try to come in for [place] for BP check.

## 2012-05-17 NOTE — Telephone Encounter (Signed)
Ok, may need ot hold her fluid pill and stay on half a tab of the 59BZ bystolic Come in end of this week for repeat BP check.

## 2012-05-20 ENCOUNTER — Ambulatory Visit (INDEPENDENT_AMBULATORY_CARE_PROVIDER_SITE_OTHER): Payer: Medicare PPO | Admitting: Family Medicine

## 2012-05-20 ENCOUNTER — Telehealth: Payer: Self-pay | Admitting: *Deleted

## 2012-05-20 VITALS — BP 120/70 | HR 60

## 2012-05-20 DIAGNOSIS — I10 Essential (primary) hypertension: Secondary | ICD-10-CM

## 2012-05-20 NOTE — Telephone Encounter (Signed)
Pt.notified

## 2012-05-20 NOTE — Progress Notes (Signed)
  Subjective:    Patient ID: Tanya Hogan, female    DOB: March 31, 1951, 61 y.o.   MRN: [id number]  HPI    Pt denies chest pain, SOB, dizziness, or heart palpitations.  Taking meds as directed w/o problems.  Denies medication side effects.  5 min spent with pt.  Review of Systems     Objective:   Physical Exam        Assessment & Plan:

## 2012-05-20 NOTE — Progress Notes (Signed)
  Subjective:    Patient ID: Tanya Hogan, female    DOB: 03-17-1951, 61 y.o.   MRN: [id number]  HPI    Review of Systems     Objective:   Physical Exam        Assessment & Plan:  HTN- blood pressure looks fantastic on increase to systolic. Let's continue current regimen it looks perfect. Hopefully she's tolerating it well. Beatrice Lecher, MD

## 2012-05-23 ENCOUNTER — Other Ambulatory Visit: Payer: Self-pay | Admitting: *Deleted

## 2012-05-23 ENCOUNTER — Telehealth: Payer: Self-pay | Admitting: *Deleted

## 2012-05-23 MED ORDER — INSULIN GLARGINE 100 UNIT/ML ~~LOC~~ SOLN: 18.0000 [IU] | Freq: Every day | SUBCUTANEOUS | Status: DC

## 2012-05-23 MED ORDER — AMBULATORY NON FORMULARY MEDICATION: Status: DC

## 2012-05-23 NOTE — Telephone Encounter (Signed)
See previous phone note, pt has been notified of advise re nurse visit for BP

## 2012-05-30 ENCOUNTER — Other Ambulatory Visit: Payer: Self-pay | Admitting: *Deleted

## 2012-05-30 DIAGNOSIS — C50419 Malignant neoplasm of upper-outer quadrant of unspecified female breast: Secondary | ICD-10-CM

## 2012-05-30 MED ORDER — TAMOXIFEN CITRATE 20 MG PO TABS: 20.0000 mg | ORAL_TABLET | Freq: Every day | ORAL | Status: DC

## 2012-06-01 ENCOUNTER — Encounter: Payer: Self-pay | Admitting: Oncology

## 2012-06-01 ENCOUNTER — Other Ambulatory Visit: Payer: Self-pay | Admitting: *Deleted

## 2012-06-01 DIAGNOSIS — C50419 Malignant neoplasm of upper-outer quadrant of unspecified female breast: Secondary | ICD-10-CM

## 2012-06-01 MED ORDER — TAMOXIFEN CITRATE 20 MG PO TABS: 20.0000 mg | ORAL_TABLET | Freq: Every day | ORAL | Status: DC

## 2012-06-01 NOTE — Progress Notes (Signed)
Patient came in and bought in new financial application. Current one expires 06/09/12. Will forward to [employer].

## 2012-06-01 NOTE — Telephone Encounter (Signed)
Started 03/2010, total of 5years planned therapy

## 2012-06-09 ENCOUNTER — Encounter: Payer: Self-pay | Admitting: Oncology

## 2012-06-09 NOTE — Progress Notes (Signed)
Pt is denied for financial assistance. Pt is overqualified based on owning stocks.

## 2012-06-21 ENCOUNTER — Other Ambulatory Visit: Payer: Self-pay | Admitting: *Deleted

## 2012-06-21 MED ORDER — AMBULATORY NON FORMULARY MEDICATION: Status: DC

## 2012-07-12 ENCOUNTER — Other Ambulatory Visit: Payer: Self-pay | Admitting: *Deleted

## 2012-07-12 MED ORDER — INSULIN GLARGINE 100 UNIT/ML ~~LOC~~ SOLN: 18.0000 [IU] | Freq: Every day | SUBCUTANEOUS | Status: DC

## 2012-07-14 ENCOUNTER — Encounter: Payer: Self-pay | Admitting: Sports Medicine

## 2012-07-14 ENCOUNTER — Ambulatory Visit (INDEPENDENT_AMBULATORY_CARE_PROVIDER_SITE_OTHER): Payer: Medicare PPO | Admitting: Sports Medicine

## 2012-07-14 VITALS — BP 130/81 | HR 73 | Wt 211.0 lb

## 2012-07-14 DIAGNOSIS — L02519 Cutaneous abscess of unspecified hand: Secondary | ICD-10-CM

## 2012-07-14 DIAGNOSIS — Z23 Encounter for immunization: Secondary | ICD-10-CM

## 2012-07-14 DIAGNOSIS — I891 Lymphangitis: Secondary | ICD-10-CM

## 2012-07-14 MED ORDER — DOXYCYCLINE HYCLATE 100 MG PO TABS: 100.0000 mg | ORAL_TABLET | Freq: Two times a day (BID) | ORAL | Status: AC

## 2012-07-14 NOTE — Assessment & Plan Note (Signed)
Culture taken. Tdap will be given today. Doxycycline for 7 days. Return next week to ensure this is improving.

## 2012-07-14 NOTE — Progress Notes (Addendum)
Subjective:    CC: Hand infection  HPI: Tanya Hogan is a pleasant 61 year old female who comes in with a several-day history of pain, swelling, redness, and drainage and she localizes over her right hand.  She localizes the pain over the index metacarpal phalangeal joint dorsally. She is also noted red streaks that travel up her dorsal forearm. She denies noting any swollen lymph nodes in her axilla. She denies fevers, chills, cat or dog scratches or bites.  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 She is unsure as to when her last tetanus shot was. The pain is moderate, does not radiate.  Past medical history, Surgical history, Family history, Social history, Allergies, and medications have been entered into the medical record, reviewed, and no changes needed.   Review of Systems: No fevers, chills, night sweats, weight loss, chest pain, or shortness of breath.   Objective:    General: Well Developed, well nourished, and in no acute distress.  Neuro: Alert and oriented x3, extra-ocular muscles intact.  HEENT: Normocephalic, atraumatic, pupils equal round reactive to light, neck supple, no masses, no lymphadenopathy, thyroid nonpalpable.  Skin: Warm and dry, no rashes. Cardiac: Regular rate and rhythm, no murmurs rubs or gallops.  Respiratory: Clear to auscultation bilaterally. Not using accessory muscles, speaking in full sentences. Right hand shows an abrasion with mild purulence over the dorsal second metacarpophalangeal joint. There are red streaks that travel up to her mid forearm. There is no fluctuance around the lesion.  Procedure: Incision and drainage of small abscess on dorsum of hand. The area was first cleaned with alcohol. The lesion was then incised superficially with an 18-gauge needle, mild purulence was expressed, this was collected for culture.  Impression and Recommendations:

## 2012-07-17 LAB — WOUND CULTURE
Gram Stain: NONE SEEN
Gram Stain: NONE SEEN
Gram Stain: NONE SEEN

## 2012-07-19 ENCOUNTER — Other Ambulatory Visit: Payer: Self-pay | Admitting: *Deleted

## 2012-07-20 ENCOUNTER — Encounter: Payer: Self-pay | Admitting: Sports Medicine

## 2012-07-20 ENCOUNTER — Ambulatory Visit (INDEPENDENT_AMBULATORY_CARE_PROVIDER_SITE_OTHER): Payer: Medicare PPO | Admitting: Sports Medicine

## 2012-07-20 VITALS — BP 126/69 | HR 79 | Temp 97.5°F | Resp 18 | Wt 213.0 lb

## 2012-07-20 DIAGNOSIS — I891 Lymphangitis: Secondary | ICD-10-CM

## 2012-07-20 NOTE — Progress Notes (Signed)
Subjective:    CC: Followup  HPI: Arina comes back for followup one week after my diagnosis of lymphangitis. I took a wound culture which grew out MRSA. She's been on doxycycline for one week, and is overall significantly better.  Past medical history, Surgical history, Family history, Social history, Allergies, and medications have been entered into the medical record, reviewed, and no changes needed.   Review of Systems: No fevers, chills, night sweats, weight loss, chest pain, or shortness of breath.   Objective:    General: Well Developed, well nourished, and in no acute distress.  Neuro: Alert and oriented x3, extra-ocular muscles intact.  HEENT: Normocephalic, atraumatic, pupils equal round reactive to light, neck supple, no masses, no lymphadenopathy, thyroid nonpalpable.  Skin: Warm and dry, no rashes. The lesion is crusting, there is no longer red streaks or lymphangitis present, there is no longer in duration, tenderness has resolved. Cardiac: Regular rate and rhythm, no murmurs rubs or gallops.  Respiratory: Clear to auscultation bilaterally. Not using accessory muscles, speaking in full sentences.   Impression and Recommendations:

## 2012-07-20 NOTE — Assessment & Plan Note (Signed)
Symptoms resolved after one week of doxycycline. Cultures grew out MRSA, sensitive to doxy. She can come back to see me on an as-needed basis

## 2012-07-25 ENCOUNTER — Other Ambulatory Visit: Payer: Self-pay | Admitting: Family Medicine

## 2012-07-28 ENCOUNTER — Other Ambulatory Visit: Payer: Self-pay | Admitting: *Deleted

## 2012-07-28 MED ORDER — BUMETANIDE 1 MG PO TABS: 1.0000 mg | ORAL_TABLET | Freq: Two times a day (BID) | ORAL | Status: DC

## 2012-08-01 ENCOUNTER — Other Ambulatory Visit: Payer: Self-pay | Admitting: *Deleted

## 2012-08-08 ENCOUNTER — Ambulatory Visit (INDEPENDENT_AMBULATORY_CARE_PROVIDER_SITE_OTHER): Payer: Medicare PPO

## 2012-08-08 ENCOUNTER — Ambulatory Visit (INDEPENDENT_AMBULATORY_CARE_PROVIDER_SITE_OTHER): Payer: Medicare PPO | Admitting: Family Medicine

## 2012-08-08 ENCOUNTER — Encounter: Payer: Self-pay | Admitting: Family Medicine

## 2012-08-08 VITALS — BP 124/81 | HR 86 | Ht 61.0 in | Wt 212.0 lb

## 2012-08-08 DIAGNOSIS — R5383 Other fatigue: Secondary | ICD-10-CM

## 2012-08-08 DIAGNOSIS — E039 Hypothyroidism, unspecified: Secondary | ICD-10-CM

## 2012-08-08 DIAGNOSIS — R5381 Other malaise: Secondary | ICD-10-CM

## 2012-08-08 DIAGNOSIS — R059 Cough, unspecified: Secondary | ICD-10-CM

## 2012-08-08 DIAGNOSIS — R21 Rash and other nonspecific skin eruption: Secondary | ICD-10-CM

## 2012-08-08 DIAGNOSIS — M899 Disorder of bone, unspecified: Secondary | ICD-10-CM

## 2012-08-08 DIAGNOSIS — E119 Type 2 diabetes mellitus without complications: Secondary | ICD-10-CM

## 2012-08-08 DIAGNOSIS — R05 Cough: Secondary | ICD-10-CM

## 2012-08-08 NOTE — Progress Notes (Signed)
Subjective:    Patient ID: Tanya Hogan, female    DOB: 18-Jul-1951, 61 y.o.   MRN: [id number]  HPI DM - well controlled.  No cuts or sores that are not healing well.  Has had 2 hypoglycemic events.    Still struggling with rash on and off. She has seen dermatology. Says she hs been using Sarna. Was told it was eczema.  HTN -  Pt denies chest pain, SOB, dizziness, or heart palpitations.  Taking meds as directed w/o problems.  Denies medication side effects.    Has been so fatigued for the last 4 months hasn't been able to exercise.  Bowels have slowed down.  Sleeping a lot more.  She has had more dry skin and hair changes.  Says occ coughs up phlegm in hte AM. Phlegm is white.  No fever.  Says occ forgets her meds.  No SOB.  She has been having some postnasal drip. She's taking her Allegra daily but says she has taken it twice in the past and it has helped so she is wondering if maybe she needs to go up again.  Review of Systems     BP 124/81  Pulse 86  Ht 5' 1"  (1.549 m)  Wt 212 lb (96.163 kg)  BMI 40.06 kg/m2    Allergies  Allergen Reactions  . Baking Soda-Fluoride (Sodium Fluoride) Itching  . Clindamycin   . Codeine   . Penicillins   . Quinapril Hcl   . Quinine   . Rifampin   . Sulfonamide Derivatives   . Miralax (Polyethylene Glycol) Rash    Past Medical History  Diagnosis Date  . HTN (hypertension)   . CHF (congestive heart failure)   . LBBB (left bundle branch block)   . Fibromyalgia   . Alkaline phosphatase elevation   . Hypothyroidism   . Closed fracture of unspecified part of radius (alone)   . Depression   . DDD (degenerative disc disease), lumbar   . Carpal tunnel syndrome   . IBS (irritable bowel syndrome)   . Breast cancer     Past Surgical History  Procedure Date  . Pacemaker placement 2009    [facility] cardiology  . Knee surgery   . Foot surgery   . Tubal ligation   . Breast lumpectomy     History   Social History  . Marital Status:  Divorced    Spouse Name: N/A    Number of Children: N/A  . Years of Education: N/A   Occupational History  . Not on file.   Social History Main Topics  . Smoking status: Former Smoker    Quit date: 08/24/1966  . Smokeless tobacco: Not on file  . Alcohol Use: No  . Drug Use: No  . Sexually Active: Not Currently   Other Topics Concern  . Not on file   Social History Narrative  . No narrative on file    Family History  Problem Relation Age of Onset  . Heart failure    . Breast cancer    . Hypertension    . Melanoma Mother     Outpatient Encounter Prescriptions as of 08/08/2012  Medication Sig Dispense Refill  . acetaminophen (ARTHRITIS PAIN RELIEF) 650 MG CR tablet Take 650 mg by mouth every 8 (eight) hours as needed.        [name] albuterol (PROAIR HFA) 108 (90 BASE) MCG/ACT inhaler Inhale 2 puffs into the lungs every 6 (six) hours as needed.        [name]  AMBULATORY NON FORMULARY MEDICATION Medication Name: Lantus Solostar Pen Needles TID Testing Diagnosis: Diabetes 250.0  100 each  0  . AMBULATORY NON FORMULARY MEDICATION True Result Glucometer.  Diagnosis:Diabetes Mellitus. Testing 2 times a day  1 each  0  . AMBULATORY NON FORMULARY MEDICATION True Result test strips.  Diagnosis: Diabetes Mellitus  Testing 2 times a day  100 each  4  . bumetanide (BUMEX) 1 MG tablet Take 1 tablet (1 mg total) by mouth 2 (two) times daily.  180 tablet  1  . cycloSPORINE (RESTASIS) 0.05 % ophthalmic emulsion 1 drop 2 (two) times daily.        . cyproheptadine (PERIACTIN) 4 MG tablet Take 4 mg by mouth 3 (three) times daily as needed.      . DULoxetine (CYMBALTA) 60 MG capsule Take 1 capsule (60 mg total) by mouth daily.  90 capsule  2  . esomeprazole (NEXIUM) 40 MG capsule Take 1 capsule (40 mg total) by mouth daily before breakfast.  90 capsule  2  . fexofenadine (ALLEGRA) 180 MG tablet Take 180 mg by mouth as needed.      . Fluticasone-Salmeterol (ADVAIR DISKUS) 250-50 MCG/DOSE AEPB Inhale 2 puffs  into the lungs every 12 (twelve) hours.        . gabapentin (NEURONTIN) 600 MG tablet TAKE 1 TABLET THREE TIMES DAILY  270 tablet  PRN  . insulin glargine (LANTUS SOLOSTAR) 100 UNIT/ML injection Inject 18 Units into the skin at bedtime.  15 pen  2  . levothyroxine (SYNTHROID, LEVOTHROID) 75 MCG tablet TAKE 1 TABLET EVERY DAY  90 tablet  PRN  . montelukast (SINGULAIR) 10 MG tablet Take 10 mg by mouth at bedtime.        . nebivolol (BYSTOLIC) 10 MG tablet Take 1 tablet (10 mg total) by mouth daily.  30 tablet  0  . pravastatin (PRAVACHOL) 40 MG tablet Take 1 tablet (40 mg total) by mouth daily.  90 tablet  2  . tamoxifen (NOLVADEX) 20 MG tablet Take 1 tablet (20 mg total) by mouth daily.  90 tablet  3  . tiZANidine (ZANAFLEX) 4 MG capsule Take 4 mg by mouth 2 (two) times daily.      . hyoscyamine (LEVSIN, ANASPAZ) 0.125 MG tablet Take 0.125 mg by mouth every 4 (four) hours as needed.             Objective:   Physical Exam  Constitutional: She is oriented to person, place, and time. She appears well-developed and well-nourished.  HENT:  Head: Normocephalic and atraumatic.  Right Ear: External ear normal.  Left Ear: External ear normal.  Nose: Nose normal.  Mouth/Throat: Oropharynx is clear and moist.       TMs and canals are clear.  Right nostril with a scab.   Eyes: Conjunctivae normal and EOM are normal. Pupils are equal, round, and reactive to light.  Neck: Neck supple. No thyromegaly present.  Cardiovascular: Normal rate, regular rhythm and normal heart sounds.   Pulmonary/Chest: Effort normal and breath sounds normal. She has no wheezes.  Lymphadenopathy:    She has no cervical adenopathy.  Neurological: She is alert and oriented to person, place, and time.  Skin: Skin is warm and dry.  Psychiatric: She has a normal mood and affect.          Assessment & Plan:  DM- Well controlled.  Continue current regimen. Followup in 3-4 months. Lab Results  Component Value Date    HGBA1C 6.3 08/08/2012  HTN - Well controlled. Continue current regimen.  Hypothyroid - Check level today. Suspect she is hypothyroid based on her current symptoms.    Fatigue-I. really think this could be coming from her thyroid. It may need adjustment to her medication regimen. If her thyroid is normal then consider further evaluation for other organ systems. Check CBC. Rule out anemia. Followup in one month if not improving.  Cough - Will get CXR since productive cough x 2 weeks to rule out infection and possible malignancy. She is a former smoker. She can increase her Allegra to twice a day if needed. She has done that in the past and has been helpful for her. She does have allergic rhinitis.

## 2012-08-09 ENCOUNTER — Ambulatory Visit: Payer: Medicare PPO | Admitting: Family Medicine

## 2012-08-09 LAB — CBC WITH DIFFERENTIAL/PLATELET
Eosinophils Relative: 1 % (ref 0–5)
Lymphocytes Relative: 27 % (ref 12–46)
Lymphs Abs: 2.2 10*3/uL (ref 0.7–4.0)
MCV: 86.2 fL (ref 78.0–100.0)
Neutro Abs: 5.3 10*3/uL (ref 1.7–7.7)
Neutrophils Relative %: 65 % (ref 43–77)
Platelets: 360 10*3/uL (ref 150–400)
RBC: 4.41 MIL/uL (ref 3.87–5.11)
WBC: 8.2 10*3/uL (ref 4.0–10.5)

## 2012-08-09 LAB — COMPLETE METABOLIC PANEL WITH GFR
ALT: 15 U/L (ref 0–35)
Albumin: 4.1 g/dL (ref 3.5–5.2)
CO2: 27 mEq/L (ref 19–32)
Calcium: 9.7 mg/dL (ref 8.4–10.5)
Chloride: 105 mEq/L (ref 96–112)
GFR, Est African American: >89 mL/min
Potassium: 5.1 mEq/L (ref 3.5–5.3)
Sodium: 139 mEq/L (ref 135–145)
Total Protein: 6.7 g/dL (ref 6.0–8.3)

## 2012-09-19 ENCOUNTER — Encounter: Payer: Self-pay | Admitting: Family Medicine

## 2012-09-19 ENCOUNTER — Ambulatory Visit (INDEPENDENT_AMBULATORY_CARE_PROVIDER_SITE_OTHER): Payer: Medicare PPO | Admitting: Family Medicine

## 2012-09-19 VITALS — BP 129/71 | HR 99 | Wt 218.0 lb

## 2012-09-19 DIAGNOSIS — F329 Major depressive disorder, single episode, unspecified: Secondary | ICD-10-CM

## 2012-09-19 DIAGNOSIS — R5383 Other fatigue: Secondary | ICD-10-CM

## 2012-09-19 DIAGNOSIS — J45909 Unspecified asthma, uncomplicated: Secondary | ICD-10-CM

## 2012-09-19 DIAGNOSIS — R5381 Other malaise: Secondary | ICD-10-CM

## 2012-09-19 MED ORDER — AMBULATORY NON FORMULARY MEDICATION: Status: DC

## 2012-09-19 MED ORDER — BUPROPION HCL ER (XL) 150 MG PO TB24: 150.0000 mg | ORAL_TABLET | Freq: Every day | ORAL | Status: DC

## 2012-09-19 NOTE — Progress Notes (Signed)
  Subjective:    Patient ID: Tanya Hogan, female    DOB: 12/04/1950, 62 y.o.   MRN: [id number]  HPI Fatigue. Still fatigued, no energy but states her head doesn't feel like its in a fog anymore. Took ginseng for 1 week and helped her head. Says still really exhausted but says sometimes feels more like this more in the winter time. Says also feels the Cold aggrevates her asthma.  Did have a big fight with her brother early this winter and started thinking about her father who she was the primary caretaker for. Mother died in Apr 23, 2023 and Dad died in 06/23/23 so this is a really hard time of year for her. She does struggle emotionally. She currently takes Cymbalta for mood and chronic pain.  She says her asthma has kept her from going outside as well. She says when she gets a cold she suddenly feels like she can't breathe well. She has not try pretreat with albuterol. Cold is a trigger for her. No other recent respiratory or cold symptoms. She stays indoors she tends to be fine. The she does have a wood stove.  Review of Systems     Objective:   Physical Exam  Constitutional: She is oriented to person, place, and time. She appears well-developed and well-nourished.  HENT:  Head: Normocephalic and atraumatic.  Cardiovascular: Normal rate, regular rhythm and normal heart sounds.   Pulmonary/Chest: Effort normal and breath sounds normal.  Neurological: She is alert and oriented to person, place, and time.  Skin: Skin is warm and dry.  Psychiatric: She has a normal mood and affect. Her behavior is normal.          Assessment & Plan:  Fatigue - consider could be SAD, seasonal affective disorder. Labs were all normal.  Discussed using wellbutrin since already on cymbalta.  Discussed potential side effects. Followup in 4-6 weeks to make sure tolerating it well and adjust dose as needed. This should minimize side effects with the Cymbalta compared to starting an SSRI. Will have her complete her PHQ  9 and her followup appointment.  Asthma - Use 2 puffs on albuterol about 10 min before going outside and see if this helps with the bronchospasm related to cold exposure. If it's still not helping then please let me know. I want her to get out of the house and a little bit more active I think this would help with her mood as well.[name]

## 2012-09-21 ENCOUNTER — Ambulatory Visit: Payer: Medicare PPO | Admitting: Cardiology

## 2012-09-23 ENCOUNTER — Telehealth: Payer: Self-pay | Admitting: Oncology

## 2012-09-23 ENCOUNTER — Other Ambulatory Visit (HOSPITAL_BASED_OUTPATIENT_CLINIC_OR_DEPARTMENT_OTHER): Payer: Medicare Other | Admitting: Lab

## 2012-09-23 ENCOUNTER — Encounter: Payer: Self-pay | Admitting: Oncology

## 2012-09-23 ENCOUNTER — Ambulatory Visit (HOSPITAL_BASED_OUTPATIENT_CLINIC_OR_DEPARTMENT_OTHER): Payer: Medicare Other | Admitting: Oncology

## 2012-09-23 VITALS — BP 138/81 | HR 103 | Temp 98.2°F | Resp 20 | Ht 61.0 in | Wt 216.8 lb

## 2012-09-23 DIAGNOSIS — Z170 Estrogen receptor positive status [ER+]: Secondary | ICD-10-CM

## 2012-09-23 DIAGNOSIS — C50919 Malignant neoplasm of unspecified site of unspecified female breast: Secondary | ICD-10-CM

## 2012-09-23 LAB — CBC WITH DIFFERENTIAL/PLATELET
EOS%: 1.4 % (ref 0.0–7.0)
LYMPH%: 32.9 % (ref 14.0–49.7)
MCH: 28.9 pg (ref 25.1–34.0)
MCV: 86.9 fL (ref 79.5–101.0)
MONO%: 7.5 % (ref 0.0–14.0)
Platelets: 292 10*3/uL (ref 145–400)
RBC: 4.4 10*6/uL (ref 3.70–5.45)
RDW: 14.9 % — ABNORMAL HIGH (ref 11.2–14.5)

## 2012-09-23 LAB — COMPREHENSIVE METABOLIC PANEL (CC13)
AST: 29 U/L (ref 5–34)
Albumin: 3.3 g/dL — ABNORMAL LOW (ref 3.5–5.0)
Alkaline Phosphatase: 93 U/L (ref 40–150)
BUN: 13.2 mg/dL (ref 7.0–26.0)
Potassium: 4.3 mEq/L (ref 3.5–5.1)
Sodium: 140 mEq/L (ref 136–145)
Total Bilirubin: 0.34 mg/dL (ref 0.20–1.20)
Total Protein: 7.2 g/dL (ref 6.4–8.3)

## 2012-09-23 NOTE — Patient Instructions (Addendum)
Continue tamoxifen daily  We will see you in 6 months

## 2012-09-23 NOTE — Telephone Encounter (Signed)
gv pt appt schedule for July.

## 2012-09-23 NOTE — Progress Notes (Signed)
OFFICE PROGRESS NOTE  CC  [name],[name], MD [address] [place] [address] Dr. Fanny Skates Dr. Gery Pray Dr. Loyal Gambler  DIAGNOSIS: 62 year old female with stage I invasive ductal carcinoma measuring 0.5 cm ER positive PR positive HER-2/neu negative diagnosed July 2011.  PRIOR THERAPY:  #1 patient initially underwent a lumpectomy of the left breast 04/22/2010. The final pathology revealed a 0.5 cm invasive ductal carcinoma that was estrogen receptor positive progesterone receptor positive HER-2/neu negative.  #2 she then went on to have partial breast irradiation.  #3 in November 2011 patient began tamoxifen 20 mg daily. Total of 5 years of therapy is planned.  CURRENT THERAPY:Tamoxifen 20 mg daily  INTERVAL HISTORY: Tanya Hogan 62 y.o. female returns for Followup visit today. She is being seen every 6 months.Clinically she seems to be doing well she is however experiencing hot flashes with the tamoxifen. She describes them as being "evil". She has about 2 episodes a day Otherwise she denies having any aches pains. She does have some blurring of vision she is seeing an ophthalmologist once a year and I have recommended that she be seen again do to change in her vision. She denies having vaginal discharge or bleeding.IShe has no nausea no vomiting no fevers chills. Remainder of the 10 point review of systems is unremarkable.  MEDICAL HISTORY: Past Medical History  Diagnosis Date  . HTN (hypertension)   . CHF (congestive heart failure)   . LBBB (left bundle branch block)   . Fibromyalgia   . Alkaline phosphatase elevation   . Hypothyroidism   . Closed fracture of unspecified part of radius (alone)   . Depression   . DDD (degenerative disc disease), lumbar   . Carpal tunnel syndrome   . IBS (irritable bowel syndrome)   . Breast cancer     ALLERGIES:  is allergic to baking soda-fluoride; clindamycin; codeine; penicillins; quinapril hcl;  quinine; rifampin; sulfonamide derivatives; and miralax.  MEDICATIONS:  Current Outpatient Prescriptions  Medication Sig Dispense Refill  . albuterol (PROAIR HFA) 108 (90 BASE) MCG/ACT inhaler Inhale 2 puffs into the lungs every 6 (six) hours as needed.        . AMBULATORY NON FORMULARY MEDICATION True Result Glucometer.  Diagnosis:Diabetes Mellitus. Testing 2 times a day  1 each  0  . AMBULATORY NON FORMULARY MEDICATION True Result test strips.  Diagnosis: Diabetes Mellitus  Testing 2 times a day  100 each  4  . AMBULATORY NON FORMULARY MEDICATION Medication Name: Lantus Solostar Pen Needles TID Testing Diagnosis: Diabetes 250.0  100 each  5  . bumetanide (BUMEX) 1 MG tablet Take 1 tablet (1 mg total) by mouth 2 (two) times daily.  180 tablet  1  . buPROPion (WELLBUTRIN XL) 150 MG 24 hr tablet Take 1 tablet (150 mg total) by mouth daily.  30 tablet  1  . cycloSPORINE (RESTASIS) 0.05 % ophthalmic emulsion 1 drop 2 (two) times daily.        . DULoxetine (CYMBALTA) 60 MG capsule Take 1 capsule (60 mg total) by mouth daily.  90 capsule  2  . esomeprazole (NEXIUM) 40 MG capsule Take 1 capsule (40 mg total) by mouth daily before breakfast.  90 capsule  2  . fexofenadine (ALLEGRA) 180 MG tablet Take 180 mg by mouth as needed.      . Fluticasone-Salmeterol (ADVAIR DISKUS) 250-50 MCG/DOSE AEPB Inhale 2 puffs into the lungs every 12 (twelve) hours.        [name]  gabapentin (NEURONTIN) 600 MG tablet TAKE 1 TABLET THREE TIMES DAILY  270 tablet  PRN  . insulin glargine (LANTUS SOLOSTAR) 100 UNIT/ML injection Inject 18 Units into the skin at bedtime.  15 pen  2  . levothyroxine (SYNTHROID, LEVOTHROID) 75 MCG tablet TAKE 1 TABLET EVERY DAY  90 tablet  PRN  . montelukast (SINGULAIR) 10 MG tablet Take 10 mg by mouth at bedtime.        . nebivolol (BYSTOLIC) 10 MG tablet Take 1 tablet (10 mg total) by mouth daily.  30 tablet  0  . pravastatin (PRAVACHOL) 40 MG tablet Take 1 tablet (40 mg total) by mouth daily.  90  tablet  2  . tamoxifen (NOLVADEX) 20 MG tablet Take 1 tablet (20 mg total) by mouth daily.  90 tablet  3  . tiZANidine (ZANAFLEX) 4 MG capsule Take 4 mg by mouth 2 (two) times daily.      [name] acetaminophen (ARTHRITIS PAIN RELIEF) 650 MG CR tablet Take 650 mg by mouth every 8 (eight) hours as needed.        . cyproheptadine (PERIACTIN) 4 MG tablet Take 4 mg by mouth 3 (three) times daily as needed.        SURGICAL HISTORY:  Past Surgical History  Procedure Date  . Pacemaker placement 2009    [facility] cardiology  . Knee surgery   . Foot surgery   . Tubal ligation   . Breast lumpectomy     REVIEW OF SYSTEMS:  Pertinent items are noted in HPI.   PHYSICAL EXAMINATION: General appearance: alert, cooperative and appears stated age Lymph nodes: Cervical, supraclavicular, and axillary nodes normal. Resp: clear to auscultation bilaterally and normal percussion bilaterally Back: symmetric, no curvature. ROM normal. No CVA tenderness. Cardio: regular rate and rhythm, S1, S2 normal, no murmur, click, rub or gallop GI: soft, non-tender; bowel sounds normal; no masses,  no organomegaly Extremities: extremities normal, atraumatic, no cyanosis or edema Neurologic: Grossly normal Bilateral breast examination: Right breast no masses nipple discharge or skin changes no nipple inversion or a portion. Left breast reveals a well-healed incisional scar there is no fullness no skin changes no tenderness. No palpable masses. No nipple discharge or inversion or retraction ECOG PERFORMANCE STATUS: 1 - Symptomatic but completely ambulatory  Blood pressure 138/81, pulse 103, temperature 98.2 F (36.8 C), temperature source Oral, resp. rate 20, height 5' 1"  (1.549 m), weight 216 lb 12.8 oz (98.34 kg).  LABORATORY DATA: Lab Results  Component Value Date   WBC 6.8 09/23/2012   HGB 12.7 09/23/2012   HCT 38.2 09/23/2012   MCV 86.9 09/23/2012   PLT 292 09/23/2012      Chemistry      Component Value Date/Time    NA 139 08/08/2012 1336   K 5.1 08/08/2012 1336   CL 105 08/08/2012 1336   CO2 27 08/08/2012 1336   BUN 14 08/08/2012 1336   CREATININE 0.78 08/08/2012 1336   CREATININE 0.74 03/17/2012 1004      Component Value Date/Time   CALCIUM 9.7 08/08/2012 1336   ALKPHOS 68 08/08/2012 1336   AST 20 08/08/2012 1336   ALT 15 08/08/2012 1336   BILITOT 0.3 08/08/2012 1336       RADIOGRAPHIC STUDIES:  No results found.  ASSESSMENT: 62 year old female with  #1 stage I invasive ductal carcinoma measuring 0.5 cm ER positive PR positive HER-2/neu negative. Patient is status post lumpectomy 04/22/2010 followed by partial breast radiation. She was then begun on adjuvant tamoxifen  20 mg daily overall she seems to be doing well her hot flashes are stable.     PLAN:   #1 patient will continue tamoxifen 20 mg daily I think overall she's doing well with it except for the hot flashes. Total of 5 years of therapy is planned.  #2 I will plan on seeing the patient back  In 6 months time or certainly I. Can see her back sooner if need arises.   All questions were answered. The patient knows to call the clinic with any problems, questions or concerns. We can certainly see the patient much sooner if necessary.  I spent 15 minutes counseling the patient face to face. The total time spent in the appointment was 30 minutes.    Marcy Panning, MD Medical/Oncology [facility] [phone] (beeper) [phone] (Office)  09/23/2012, 10:30 AM

## 2012-10-03 ENCOUNTER — Other Ambulatory Visit: Payer: Self-pay | Admitting: *Deleted

## 2012-10-03 DIAGNOSIS — I10 Essential (primary) hypertension: Secondary | ICD-10-CM

## 2012-10-03 MED ORDER — FLUTICASONE-SALMETEROL 250-50 MCG/DOSE IN AEPB: 2.0000 | INHALATION_SPRAY | Freq: Two times a day (BID) | RESPIRATORY_TRACT | Status: DC

## 2012-10-03 MED ORDER — LEVOTHYROXINE SODIUM 75 MCG PO TABS: ORAL_TABLET | ORAL | Status: DC

## 2012-10-03 MED ORDER — BUMETANIDE 1 MG PO TABS: 1.0000 mg | ORAL_TABLET | Freq: Two times a day (BID) | ORAL | Status: DC

## 2012-10-03 MED ORDER — INSULIN GLARGINE 100 UNIT/ML ~~LOC~~ SOLN: 18.0000 [IU] | Freq: Every day | SUBCUTANEOUS | Status: DC

## 2012-10-03 MED ORDER — NEBIVOLOL HCL 5 MG PO TABS: 5.0000 mg | ORAL_TABLET | Freq: Every day | ORAL | Status: DC

## 2012-10-03 MED ORDER — PRAVASTATIN SODIUM 40 MG PO TABS: 40.0000 mg | ORAL_TABLET | Freq: Every day | ORAL | Status: DC

## 2012-10-03 MED ORDER — ALBUTEROL SULFATE HFA 108 (90 BASE) MCG/ACT IN AERS: 2.0000 | INHALATION_SPRAY | Freq: Four times a day (QID) | RESPIRATORY_TRACT | Status: DC | PRN

## 2012-10-10 ENCOUNTER — Other Ambulatory Visit: Payer: Self-pay | Admitting: Internal Medicine

## 2012-10-10 ENCOUNTER — Ambulatory Visit (INDEPENDENT_AMBULATORY_CARE_PROVIDER_SITE_OTHER): Payer: Medicare Other | Admitting: *Deleted

## 2012-10-10 ENCOUNTER — Encounter: Payer: Self-pay | Admitting: Internal Medicine

## 2012-10-10 DIAGNOSIS — I509 Heart failure, unspecified: Secondary | ICD-10-CM

## 2012-10-10 LAB — PACEMAKER DEVICE OBSERVATION
ATRIAL PACING PM: 15
LV LEAD THRESHOLD: 1 V
RV LEAD IMPEDENCE PM: 665 Ohm
RV LEAD THRESHOLD: 1 V
VENTRICULAR PACING PM: 99

## 2012-10-10 NOTE — Progress Notes (Signed)
PPM check

## 2012-10-17 ENCOUNTER — Telehealth: Payer: Self-pay | Admitting: Family Medicine

## 2012-10-17 MED ORDER — BECLOMETHASONE DIPROPIONATE 40 MCG/ACT IN AERS: 2.0000 | INHALATION_SPRAY | Freq: Two times a day (BID) | RESPIRATORY_TRACT | Status: DC

## 2012-10-17 NOTE — Telephone Encounter (Signed)
She is ok with which ever you choose. [facility] [place].

## 2012-10-17 NOTE — Telephone Encounter (Signed)
Please call patient: Her insurance will not pay for Advair. She must have tried Qvar or Flovent first. If she's okay with either one of these, then let me know and I can send this over to her pharmacy. Then she can followup with me in 6-8 weeks to make sure that she's doing well on it.

## 2012-10-17 NOTE — Telephone Encounter (Signed)
A prescription was sent to the pharmacy for Qvar 40.

## 2012-10-19 ENCOUNTER — Encounter: Payer: Self-pay | Admitting: Cardiology

## 2012-10-19 ENCOUNTER — Ambulatory Visit (INDEPENDENT_AMBULATORY_CARE_PROVIDER_SITE_OTHER): Payer: Medicare Other | Admitting: Cardiology

## 2012-10-19 VITALS — BP 142/86 | HR 86 | Wt 216.0 lb

## 2012-10-19 DIAGNOSIS — I10 Essential (primary) hypertension: Secondary | ICD-10-CM

## 2012-10-19 MED ORDER — BUMETANIDE 1 MG PO TABS: 1.0000 mg | ORAL_TABLET | Freq: Two times a day (BID) | ORAL | Status: DC

## 2012-10-19 NOTE — Assessment & Plan Note (Signed)
Continue present medications. 

## 2012-10-19 NOTE — Assessment & Plan Note (Signed)
Continue statin. 

## 2012-10-19 NOTE — Assessment & Plan Note (Signed)
Euvolemic. Continue present dose of diuretic.

## 2012-10-19 NOTE — Patient Instructions (Addendum)
Your physician wants you to follow-up in: [place] will receive a reminder letter in the mail two months in advance. If you don't receive a letter, please call our office to schedule the follow-up appointment.

## 2012-10-19 NOTE — Assessment & Plan Note (Signed)
Management per electrophysiology. 

## 2012-10-19 NOTE — Progress Notes (Signed)
HPI: Pleasant female with past medical history of congestive heart failure and hypertension for F/U. Last Echo in April of 2011 revealed normal LV function, mild left atrial enlargement and a trivial pericardial effusion. Patient also has a history of pacemaker. I last saw her in August of 2013. Since then, she denies dyspnea, chest pain, palpitations or syncope.   Current Outpatient Prescriptions  Medication Sig Dispense Refill  . acetaminophen (ARTHRITIS PAIN RELIEF) 650 MG CR tablet Take 650 mg by mouth every 8 (eight) hours as needed.        [name] albuterol (PROAIR HFA) 108 (90 BASE) MCG/ACT inhaler Inhale 2 puffs into the lungs every 6 (six) hours as needed.  18 g  2  . AMBULATORY NON FORMULARY MEDICATION True Result Glucometer.  Diagnosis:Diabetes Mellitus. Testing 2 times a day  1 each  0  . AMBULATORY NON FORMULARY MEDICATION True Result test strips.  Diagnosis: Diabetes Mellitus  Testing 2 times a day  100 each  4  . AMBULATORY NON FORMULARY MEDICATION Medication Name: Lantus Solostar Pen Needles TID Testing Diagnosis: Diabetes 250.0  100 each  5  . beclomethasone (QVAR) 40 MCG/ACT inhaler Inhale 2 puffs into the lungs 2 (two) times daily.  1 Inhaler  6  . bumetanide (BUMEX) 1 MG tablet Take 1 tablet (1 mg total) by mouth 2 (two) times daily.  180 tablet  1  . buPROPion (WELLBUTRIN XL) 150 MG 24 hr tablet Take 1 tablet (150 mg total) by mouth daily.  30 tablet  1  . cycloSPORINE (RESTASIS) 0.05 % ophthalmic emulsion 1 drop 2 (two) times daily.        . cyproheptadine (PERIACTIN) 4 MG tablet Take 4 mg by mouth 3 (three) times daily as needed.      . DULoxetine (CYMBALTA) 60 MG capsule Take 1 capsule (60 mg total) by mouth daily.  90 capsule  2  . esomeprazole (NEXIUM) 40 MG capsule Take 1 capsule (40 mg total) by mouth daily before breakfast.  90 capsule  2  . fexofenadine (ALLEGRA) 180 MG tablet Take 180 mg by mouth as needed.      . gabapentin (NEURONTIN) 600 MG tablet TAKE 1 TABLET THREE  TIMES DAILY  270 tablet  PRN  . insulin glargine (LANTUS SOLOSTAR) 100 UNIT/ML injection Inject 18 Units into the skin at bedtime.  15 pen  2  . levothyroxine (SYNTHROID, LEVOTHROID) 75 MCG tablet TAKE 1 TABLET EVERY DAY  90 tablet  PRN  . montelukast (SINGULAIR) 10 MG tablet Take 10 mg by mouth at bedtime.        . nebivolol (BYSTOLIC) 5 MG tablet Take 1 tablet (5 mg total) by mouth daily.  90 tablet  2  . pravastatin (PRAVACHOL) 40 MG tablet Take 1 tablet (40 mg total) by mouth daily.  90 tablet  2  . tamoxifen (NOLVADEX) 20 MG tablet Take 1 tablet (20 mg total) by mouth daily.  90 tablet  3   No current facility-administered medications for this visit.     Past Medical History  Diagnosis Date  . HTN (hypertension)   . CHF (congestive heart failure)   . LBBB (left bundle branch block)   . Fibromyalgia   . Alkaline phosphatase elevation   . Hypothyroidism   . Closed fracture of unspecified part of radius (alone)   . Depression   . DDD (degenerative disc disease), lumbar   . Carpal tunnel syndrome   . IBS (irritable bowel syndrome)   .  Breast cancer     Past Surgical History  Procedure Laterality Date  . Pacemaker placement  2009    [facility] cardiology  . Knee surgery    . Foot surgery    . Tubal ligation    . Breast lumpectomy      History   Social History  . Marital Status: Divorced    Spouse Name: N/A    Number of Children: N/A  . Years of Education: N/A   Occupational History  . Not on file.   Social History Main Topics  . Smoking status: Former Smoker    Quit date: 08/24/1966  . Smokeless tobacco: Not on file  . Alcohol Use: No  . Drug Use: No  . Sexually Active: Not Currently   Other Topics Concern  . Not on file   Social History Narrative  . No narrative on file    ROS: no fevers or chills, productive cough, hemoptysis, dysphasia, odynophagia, melena, hematochezia, dysuria, hematuria, rash, seizure activity, orthopnea, PND, pedal edema,  claudication. Remaining systems are negative.  Physical Exam: Well-developed well-nourished in no acute distress.  Skin is warm and dry.  HEENT is normal.  Neck is supple.  Chest is clear to auscultation with normal expansion.  Cardiovascular exam is regular rate and rhythm.  Abdominal exam nontender or distended. No masses palpated. Extremities show no edema. neuro grossly intact  ECG sinus rhythm with ventricular pacing.

## 2012-10-31 ENCOUNTER — Ambulatory Visit (INDEPENDENT_AMBULATORY_CARE_PROVIDER_SITE_OTHER): Payer: Medicare Other | Admitting: Family Medicine

## 2012-10-31 ENCOUNTER — Encounter: Payer: Self-pay | Admitting: Family Medicine

## 2012-10-31 VITALS — BP 134/72 | HR 80 | Wt 216.0 lb

## 2012-10-31 DIAGNOSIS — F3289 Other specified depressive episodes: Secondary | ICD-10-CM

## 2012-10-31 DIAGNOSIS — F329 Major depressive disorder, single episode, unspecified: Secondary | ICD-10-CM
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
## 2012-11-13 ENCOUNTER — Other Ambulatory Visit: Payer: Self-pay | Admitting: Family Medicine

## 2012-11-14 ENCOUNTER — Other Ambulatory Visit: Payer: Self-pay | Admitting: *Deleted

## 2012-11-14 MED ORDER — DULOXETINE HCL 60 MG PO CPEP: 60.0000 mg | ORAL_CAPSULE | Freq: Every day | ORAL | Status: DC

## 2012-11-28 ENCOUNTER — Ambulatory Visit (INDEPENDENT_AMBULATORY_CARE_PROVIDER_SITE_OTHER): Payer: Medicare Other | Admitting: Family Medicine

## 2012-11-28 ENCOUNTER — Encounter: Payer: Self-pay | Admitting: Family Medicine

## 2012-11-28 ENCOUNTER — Telehealth: Payer: Self-pay | Admitting: Family Medicine

## 2012-11-28 VITALS — BP 118/73 | HR 52 | Ht 62.0 in | Wt 212.0 lb

## 2012-11-28 DIAGNOSIS — N951 Menopausal and female climacteric states: Secondary | ICD-10-CM

## 2012-11-28 DIAGNOSIS — J029 Acute pharyngitis, unspecified: Secondary | ICD-10-CM

## 2012-11-28 DIAGNOSIS — Z23 Encounter for immunization: Secondary | ICD-10-CM

## 2012-11-28 DIAGNOSIS — F3289 Other specified depressive episodes: Secondary | ICD-10-CM

## 2012-11-28 DIAGNOSIS — E119 Type 2 diabetes mellitus without complications: Secondary | ICD-10-CM

## 2012-11-28 DIAGNOSIS — F329 Major depressive disorder, single episode, unspecified: Secondary | ICD-10-CM

## 2012-11-28 LAB — POCT UA - MICROALBUMIN
Albumin/Creatinine Ratio, Urine, POC: <30
Microalbumin Ur, POC: 30 mg/dL

## 2012-11-28 LAB — POCT RAPID STREP A (OFFICE): Rapid Strep A Screen: NEGATIVE

## 2012-11-28 NOTE — Progress Notes (Signed)
  Subjective:    Patient ID: Tanya Hogan, female    DOB: [birth year], 62 y.o.   MRN: [id number]  HPI Depression - Feels mood is well controlled on the wellbutrin.  WOULD like to continue it.    DM- well controlled.  No hypoglycemic events.  No cuts or sores that aren't healing well. Tolerating medication well. She's on Lantus.  Has had a ST for 1.5 weeks and has had some post nasal drip.  Painful to swallow. Around her grandkids.  No fever.  She did start her allergy meds already.  Some hotflashes form the tamoxifen.  No cough or sinus congestion.  Review of Systems     Objective:   Physical Exam  Constitutional: She is oriented to person, place, and time. She appears well-developed and well-nourished.  HENT:  Head: Normocephalic and atraumatic.  Neck: Neck supple. No thyromegaly present.  Cardiovascular: Normal rate, regular rhythm and normal heart sounds.   Pulmonary/Chest: Effort normal and breath sounds normal.  Lymphadenopathy:    She has no cervical adenopathy.  Neurological: She is alert and oriented to person, place, and time.  Skin: Skin is warm and dry.  Psychiatric: She has a normal mood and affect. Her behavior is normal.          Assessment & Plan:  DM- A1C 6.2 today.  Well contorlled. Due for pneumonia vaccine. F/u in 4 months.  On statin. Not on ACE.    Depression - doing well on Wellbutrin. Continue for now.  Consider weaning when she has been on it for 6-12 months..   Due for bone density - Will schedule.  She is actually overdue. We'll schedule at the breast Center in [place].  Acute pharyngitis - Willl check for strep. Acute strep was negative Maybe secondary to post nasal drip. Continue allergy medication.  Call if not clearing in the next week or 2. If it gets worse or not improving we could consider a short course of steroids.

## 2012-11-28 NOTE — Telephone Encounter (Signed)
Please call patient and find out if she can take ACE inhibitors. Quinapril listed as an allergy but I wasn't sure exactly what reaction she had to. With her history of diabetes and congestive heart failure she should really be on this class of medications unless there is a contraindication.

## 2012-11-29 NOTE — Telephone Encounter (Signed)
Called pt to find out about her taking an ACE inhibitor she stated that years ago when she took Quinapril and Accupril it caused her potassium to go high.Tanya Hogan, [name]

## 2012-11-30 ENCOUNTER — Encounter: Payer: Self-pay | Admitting: *Deleted

## 2012-11-30 ENCOUNTER — Emergency Department
Admission: EM | Admit: 2012-11-30 | Discharge: 2012-11-30 | Disposition: A | Discharge status: Home / Self Care | Payer: Medicare Other | Source: Home / Self Care | Attending: Family Medicine | Admitting: Family Medicine

## 2012-11-30 DIAGNOSIS — L03317 Cellulitis of buttock: Secondary | ICD-10-CM

## 2012-11-30 DIAGNOSIS — L0231 Cutaneous abscess of buttock: Secondary | ICD-10-CM

## 2012-11-30 MED ORDER — DOXYCYCLINE HYCLATE 100 MG PO CAPS: 100.0000 mg | ORAL_CAPSULE | Freq: Two times a day (BID) | ORAL | Status: AC

## 2012-11-30 NOTE — ED Provider Notes (Addendum)
History     CSN: [id number]  Arrival date & time 11/30/12  1727   First MD Initiated Contact with Patient 11/30/12 1730      Chief Complaint  Patient presents with  . Rash   HPI  HPI  This patient complains of a RASH  Location: R buttock   Onset: 5-7 days   Course: mild redness and R post buttock that pt's brother initially noticed. Has had some progressive redness. Mildly tender Self-treated with: nothing   Improvement with treatment: no  History  Itching: no  Tenderness: mild  New medications/antibiotics: no  Pet exposure: yes  Recent travel or tropical exposure: no  New soaps, shampoos, detergent, clothing: no  Tick/insect exposure: unsure, but owns multiple animals   Chemical Exposure: no  Red Flags  Feeling ill: no  Fever: no  Facial/tongue swelling/difficulty breathing: no  Diabetic or immunocompromised: yes; diabetes well controlled per pt.    Past Medical History  Diagnosis Date  . HTN (hypertension)   . CHF (congestive heart failure)   . LBBB (left bundle branch block)   . Fibromyalgia   . Alkaline phosphatase elevation   . Hypothyroidism   . Closed fracture of unspecified part of radius (alone)   . Depression   . DDD (degenerative disc disease), lumbar   . Carpal tunnel syndrome   . IBS (irritable bowel syndrome)   . Breast cancer     Past Surgical History  Procedure Laterality Date  . Pacemaker placement  2009    [facility] cardiology  . Knee surgery    . Foot surgery    . Tubal ligation    . Breast lumpectomy      Family History  Problem Relation Age of Onset  . Heart failure    . Breast cancer    . Hypertension    . Melanoma Mother     History  Substance Use Topics  . Smoking status: Former Smoker    Quit date: 08/24/1966  . Smokeless tobacco: Not on file  . Alcohol Use: No    OB History   Grav Para Term Preterm Abortions TAB SAB Ect Mult Living                  Review of Systems  All other systems reviewed and are  negative.    Allergies  Accupril; Baking soda-fluoride; Clindamycin; Codeine; Penicillins; Quinine; Rifampin; Sulfonamide derivatives; and Miralax  Home Medications   Current Outpatient Rx  Name  Route  Sig  Dispense  Refill  . acetaminophen (ARTHRITIS PAIN RELIEF) 650 MG CR tablet   Oral   Take 650 mg by mouth every 8 (eight) hours as needed.           [name] albuterol (PROAIR HFA) 108 (90 BASE) MCG/ACT inhaler   Inhalation   Inhale 2 puffs into the lungs every 6 (six) hours as needed.   18 g   2   . AMBULATORY NON FORMULARY MEDICATION      True Result Glucometer.  Diagnosis:Diabetes Mellitus. Testing 2 times a day   1 each   0   . AMBULATORY NON FORMULARY MEDICATION      True Result test strips.  Diagnosis: Diabetes Mellitus  Testing 2 times a day   100 each   4   . AMBULATORY NON FORMULARY MEDICATION      Medication Name: Lantus Solostar Pen Needles TID Testing Diagnosis: Diabetes 250.0   100 each   5   . beclomethasone (QVAR)  40 MCG/ACT inhaler   Inhalation   Inhale 2 puffs into the lungs 2 (two) times daily.   1 Inhaler   6   . bumetanide (BUMEX) 1 MG tablet   Oral   Take 1 tablet (1 mg total) by mouth 2 (two) times daily.   180 tablet   3   . buPROPion (WELLBUTRIN XL) 150 MG 24 hr tablet      TAKE ONE TABLET BY MOUTH EVERY DAY   30 tablet   4   . cycloSPORINE (RESTASIS) 0.05 % ophthalmic emulsion      1 drop 2 (two) times daily.           . cyproheptadine (PERIACTIN) 4 MG tablet   Oral   Take 4 mg by mouth 3 (three) times daily as needed.         . doxycycline (VIBRAMYCIN) 100 MG capsule   Oral   Take 1 capsule (100 mg total) by mouth 2 (two) times daily.   14 capsule   0   . DULoxetine (CYMBALTA) 60 MG capsule   Oral   Take 1 capsule (60 mg total) by mouth daily.   90 capsule   2   . esomeprazole (NEXIUM) 40 MG capsule   Oral   Take 1 capsule (40 mg total) by mouth daily before breakfast.   90 capsule   2   . fexofenadine  (ALLEGRA) 180 MG tablet   Oral   Take 180 mg by mouth as needed.         . gabapentin (NEURONTIN) 600 MG tablet      TAKE 1 TABLET THREE TIMES DAILY   270 tablet   PRN   . insulin glargine (LANTUS SOLOSTAR) 100 UNIT/ML injection   Subcutaneous   Inject 18 Units into the skin at bedtime.   15 pen   2   . levothyroxine (SYNTHROID, LEVOTHROID) 75 MCG tablet      TAKE 1 TABLET EVERY DAY   90 tablet   PRN   . montelukast (SINGULAIR) 10 MG tablet   Oral   Take 10 mg by mouth at bedtime.           . nebivolol (BYSTOLIC) 5 MG tablet   Oral   Take 1 tablet (5 mg total) by mouth daily.   90 tablet   2   . pravastatin (PRAVACHOL) 40 MG tablet   Oral   Take 1 tablet (40 mg total) by mouth daily.   90 tablet   2   . tamoxifen (NOLVADEX) 20 MG tablet   Oral   Take 1 tablet (20 mg total) by mouth daily.   90 tablet   3     BP 142/83  Pulse 91  Temp(Src) 98.1 F (36.7 C) (Oral)  Wt 214 lb (97.07 kg)  BMI 39.13 kg/m2  SpO2 97%  Physical Exam  Constitutional: She appears well-developed and well-nourished.  HENT:  Head: Normocephalic and atraumatic.  Eyes: Conjunctivae are normal. Pupils are equal, round, and reactive to light.  Neck: Normal range of motion. Neck supple.  Cardiovascular: Normal rate, regular rhythm and normal heart sounds.   Pulmonary/Chest: Effort normal.  Abdominal: Soft.  Musculoskeletal: Normal range of motion.  Neurological: She is alert.  Skin: Rash noted.   Mild central induration.  Able to express small amount of purulent fluid tomorrow.  ED Course  Procedures (including critical care time)  Labs Reviewed  WOUND CULTURE   No results found.   1. Cellulitis  and abscess of buttock       MDM  Wound culture.  Will place on doxy.  Discussed infectious and derm red flags. Follow up with PCP if not improved.     The patient and/or caregiver has been counseled thoroughly with regard to treatment plan and/or medications  prescribed including dosage, schedule, interactions, rationale for use, and possible side effects and they verbalize understanding. Diagnoses and expected course of recovery discussed and will return if not improved as expected or if the condition worsens. Patient and/or caregiver verbalized understanding.             Shanda Howells, MD 11/30/12 [facility]  Shanda Howells, MD 11/30/12 [phone]

## 2012-11-30 NOTE — ED Notes (Signed)
Pt c/o bright red rash on the back of her RT upper leg x 1 wk. Denies fever.

## 2012-11-30 NOTE — Telephone Encounter (Signed)
AH ok, thank you.

## 2012-12-01 ENCOUNTER — Telehealth: Payer: Self-pay | Admitting: *Deleted

## 2012-12-01 DIAGNOSIS — M8580 Other specified disorders of bone density and structure, unspecified site: Secondary | ICD-10-CM

## 2012-12-03 ENCOUNTER — Telehealth: Payer: Self-pay | Admitting: Emergency Medicine

## 2012-12-03 LAB — WOUND CULTURE

## 2012-12-27 ENCOUNTER — Other Ambulatory Visit: Payer: Medicare Other

## 2013-01-02 ENCOUNTER — Other Ambulatory Visit: Payer: Self-pay | Admitting: *Deleted

## 2013-01-02 MED ORDER — BUMETANIDE 1 MG PO TABS: 1.0000 mg | ORAL_TABLET | Freq: Two times a day (BID) | ORAL | Status: DC

## 2013-01-11 ENCOUNTER — Ambulatory Visit
Admission: RE | Admit: 2013-01-11 | Discharge: 2013-01-11 | Disposition: A | Discharge status: Home / Self Care | Payer: Medicare Other | Source: Ambulatory Visit | Attending: Family Medicine | Admitting: Family Medicine

## 2013-01-11 DIAGNOSIS — M8580 Other specified disorders of bone density and structure, unspecified site: Secondary | ICD-10-CM

## 2013-01-12 ENCOUNTER — Other Ambulatory Visit: Payer: Self-pay | Admitting: Family Medicine

## 2013-01-12 ENCOUNTER — Telehealth: Payer: Self-pay | Admitting: *Deleted

## 2013-01-12 NOTE — Telephone Encounter (Signed)
Error

## 2013-01-25 ENCOUNTER — Other Ambulatory Visit: Payer: Self-pay | Admitting: *Deleted

## 2013-01-25 DIAGNOSIS — C50419 Malignant neoplasm of upper-outer quadrant of unspecified female breast: Secondary | ICD-10-CM

## 2013-01-25 MED ORDER — TAMOXIFEN CITRATE 20 MG PO TABS: 20.0000 mg | ORAL_TABLET | Freq: Every day | ORAL | Status: DC

## 2013-01-25 NOTE — Telephone Encounter (Signed)
Patient has had change to [facility]. No longer uses [facility]

## 2013-02-08 ENCOUNTER — Telehealth: Payer: Self-pay | Admitting: *Deleted

## 2013-02-08 NOTE — Telephone Encounter (Signed)
Called [facility]. Lannette Donath, RN had escribed refill on Tamoxifen on 01/25/13 for 90 tabs, 3 refills. Pharmacy does not show record of that refill. Took verbal refill today.

## 2013-02-13 ENCOUNTER — Encounter: Payer: Self-pay | Admitting: Family Medicine

## 2013-03-02 ENCOUNTER — Other Ambulatory Visit: Payer: Self-pay

## 2013-03-02 ENCOUNTER — Ambulatory Visit (HOSPITAL_BASED_OUTPATIENT_CLINIC_OR_DEPARTMENT_OTHER): Payer: Medicare Other | Admitting: Oncology

## 2013-03-02 ENCOUNTER — Encounter: Payer: Self-pay | Admitting: Oncology

## 2013-03-02 ENCOUNTER — Telehealth: Payer: Self-pay | Admitting: *Deleted

## 2013-03-02 ENCOUNTER — Other Ambulatory Visit (HOSPITAL_BASED_OUTPATIENT_CLINIC_OR_DEPARTMENT_OTHER): Payer: Medicare Other | Admitting: Lab

## 2013-03-02 VITALS — BP 119/76 | HR 111 | Temp 98.2°F | Resp 20 | Ht 62.0 in | Wt 210.5 lb

## 2013-03-02 DIAGNOSIS — C50919 Malignant neoplasm of unspecified site of unspecified female breast: Secondary | ICD-10-CM

## 2013-03-02 LAB — COMPREHENSIVE METABOLIC PANEL (CC13)
AST: 27 U/L (ref 5–34)
Albumin: 3.4 g/dL — ABNORMAL LOW (ref 3.5–5.0)
Alkaline Phosphatase: 96 U/L (ref 40–150)
BUN: 15.8 mg/dL (ref 7.0–26.0)
Potassium: 4.8 mEq/L (ref 3.5–5.1)
Sodium: 141 mEq/L (ref 136–145)
Total Bilirubin: 0.32 mg/dL (ref 0.20–1.20)
Total Protein: 7.1 g/dL (ref 6.4–8.3)

## 2013-03-02 LAB — CBC WITH DIFFERENTIAL/PLATELET
EOS%: 1.7 % (ref 0.0–7.0)
MCH: 29 pg (ref 25.1–34.0)
MCV: 88.3 fL (ref 79.5–101.0)
MONO%: 6 % (ref 0.0–14.0)
RBC: 4.51 10*6/uL (ref 3.70–5.45)
RDW: 14.7 % — ABNORMAL HIGH (ref 11.2–14.5)

## 2013-03-02 NOTE — Telephone Encounter (Signed)
appts made and printed...td 

## 2013-03-02 NOTE — Progress Notes (Signed)
OFFICE PROGRESS NOTE  CC  [name],[name], MD [address] [place] [address] Dr. Fanny Skates Dr. Gery Pray Dr. Loyal Gambler  DIAGNOSIS: 62 year old female with stage I invasive ductal carcinoma measuring 0.5 cm ER positive PR positive HER-2/neu negative diagnosed July 2011.  PRIOR THERAPY:  #1 patient initially underwent a lumpectomy of the left breast 04/22/2010. The final pathology revealed a 0.5 cm invasive ductal carcinoma that was estrogen receptor positive progesterone receptor positive HER-2/neu negative.  #2 she then went on to have partial breast irradiation.  #3 in November 2011 patient began tamoxifen 20 mg daily. Total of 5 years of therapy is planned.  CURRENT THERAPY:Tamoxifen 20 mg daily  INTERVAL HISTORY: Tanya Hogan 62 y.o. female returns for Followup visit today. She is being seen every 6 months.Clinically she seems to be doing well she is however experiencing hot flashes with the tamoxifen. She describes them as being "evil". She has about 2 episodes a day Otherwise she denies having any aches pains. She does have some blurring of vision she is seeing an ophthalmologist once a year and I have recommended that she be seen again do to change in her vision. She denies having vaginal discharge or bleeding.IShe has no nausea no vomiting no fevers chills. Remainder of the 10 point review of systems is unremarkable.  MEDICAL HISTORY: Past Medical History  Diagnosis Date  . HTN (hypertension)   . CHF (congestive heart failure)   . LBBB (left bundle branch block)   . Fibromyalgia   . Alkaline phosphatase elevation   . Hypothyroidism   . Closed fracture of unspecified part of radius (alone)   . Depression   . DDD (degenerative disc disease), lumbar   . Carpal tunnel syndrome   . IBS (irritable bowel syndrome)   . Breast cancer     ALLERGIES:  is allergic to accupril; baking soda-fluoride; clindamycin; codeine; losartan potassium;  penicillins; quinine; rifampin; sulfonamide derivatives; and miralax.  MEDICATIONS:  Current Outpatient Prescriptions  Medication Sig Dispense Refill  . albuterol (PROAIR HFA) 108 (90 BASE) MCG/ACT inhaler Inhale 2 puffs into the lungs every 6 (six) hours as needed.  18 g  2  . AMBULATORY NON FORMULARY MEDICATION True Result test strips.  Diagnosis: Diabetes Mellitus  Testing 2 times a day  100 each  4  . AMBULATORY NON FORMULARY MEDICATION Medication Name: Lantus Solostar Pen Needles TID Testing Diagnosis: Diabetes 250.0  100 each  5  . beclomethasone (QVAR) 40 MCG/ACT inhaler Inhale 2 puffs into the lungs 2 (two) times daily.  1 Inhaler  6  . bumetanide (BUMEX) 1 MG tablet Take 1 tablet (1 mg total) by mouth 2 (two) times daily.  180 tablet  3  . buPROPion (WELLBUTRIN XL) 150 MG 24 hr tablet TAKE ONE TABLET BY MOUTH EVERY DAY  30 tablet  4  . cycloSPORINE (RESTASIS) 0.05 % ophthalmic emulsion 1 drop 2 (two) times daily.        . DULoxetine (CYMBALTA) 60 MG capsule Take 1 capsule (60 mg total) by mouth daily.  90 capsule  2  . esomeprazole (NEXIUM) 40 MG capsule Take 1 capsule (40 mg total) by mouth daily before breakfast.  90 capsule  2  . fexofenadine (ALLEGRA) 180 MG tablet Take 180 mg by mouth as needed.      . gabapentin (NEURONTIN) 600 MG tablet TAKE 1 TABLET THREE TIMES DAILY  270 tablet  PRN  . insulin glargine (LANTUS SOLOSTAR) 100 UNIT/ML injection  Inject 18 Units into the skin at bedtime.  15 pen  2  . levothyroxine (SYNTHROID, LEVOTHROID) 75 MCG tablet TAKE 1 TABLET EVERY DAY  90 tablet  PRN  . montelukast (SINGULAIR) 10 MG tablet Take 10 mg by mouth at bedtime.        . nebivolol (BYSTOLIC) 5 MG tablet Take 1 tablet (5 mg total) by mouth daily.  90 tablet  2  . pravastatin (PRAVACHOL) 40 MG tablet Take 1 tablet (40 mg total) by mouth daily.  90 tablet  2  . tamoxifen (NOLVADEX) 20 MG tablet Take 1 tablet (20 mg total) by mouth daily.  90 tablet  3  . tiZANidine (ZANAFLEX) 4 MG  capsule Take 4 mg by mouth daily.      [name] triamcinolone (KENALOG) 0.1 % paste Place onto teeth as needed.      [name] acetaminophen (ARTHRITIS PAIN RELIEF) 650 MG CR tablet Take 650 mg by mouth every 8 (eight) hours as needed.        . cyproheptadine (PERIACTIN) 4 MG tablet Take 4 mg by mouth 3 (three) times daily as needed.       No current facility-administered medications for this visit.    SURGICAL HISTORY:  Past Surgical History  Procedure Laterality Date  . Pacemaker placement  2009    [facility] cardiology  . Knee surgery    . Foot surgery    . Tubal ligation    . Breast lumpectomy      REVIEW OF SYSTEMS:  Pertinent items are noted in HPI.   PHYSICAL EXAMINATION: General appearance: alert, cooperative and appears stated age Lymph nodes: Cervical, supraclavicular, and axillary nodes normal. Resp: clear to auscultation bilaterally and normal percussion bilaterally Back: symmetric, no curvature. ROM normal. No CVA tenderness. Cardio: regular rate and rhythm, S1, S2 normal, no murmur, click, rub or gallop GI: soft, non-tender; bowel sounds normal; no masses,  no organomegaly Extremities: extremities normal, atraumatic, no cyanosis or edema Neurologic: Grossly normal Bilateral breast examination: Right breast no masses nipple discharge or skin changes no nipple inversion or a portion. Left breast reveals a well-healed incisional scar there is no fullness no skin changes no tenderness. No palpable masses. No nipple discharge or inversion or retraction ECOG PERFORMANCE STATUS: 1 - Symptomatic but completely ambulatory  Blood pressure 119/76, pulse 111, temperature 98.2 F (36.8 C), temperature source Oral, resp. rate 20, height 5' 2"  (1.575 m), weight 210 lb 8 oz (95.482 kg).  LABORATORY DATA: Lab Results  Component Value Date   WBC 6.6 03/02/2013   HGB 13.1 03/02/2013   HCT 39.8 03/02/2013   MCV 88.3 03/02/2013   PLT 308 03/02/2013      Chemistry      Component Value Date/Time    NA 141 03/02/2013 1056   NA 139 08/08/2012 1336   K 4.8 03/02/2013 1056   K 5.1 08/08/2012 1336   CL 106 09/23/2012 0947   CL 105 08/08/2012 1336   CO2 23 03/02/2013 1056   CO2 27 08/08/2012 1336   BUN 15.8 03/02/2013 1056   BUN 14 08/08/2012 1336   CREATININE 0.9 03/02/2013 1056   CREATININE 0.78 08/08/2012 1336   CREATININE 0.74 03/17/2012 1004      Component Value Date/Time   CALCIUM 9.7 03/02/2013 1056   CALCIUM 9.7 08/08/2012 1336   ALKPHOS 96 03/02/2013 1056   ALKPHOS 68 08/08/2012 1336   AST 27 03/02/2013 1056   AST 20 08/08/2012 1336   ALT 28 03/02/2013 1056  ALT 15 08/08/2012 1336   BILITOT 0.32 03/02/2013 1056   BILITOT 0.3 08/08/2012 1336       RADIOGRAPHIC STUDIES:  No results found.  ASSESSMENT: 62 year old female with  #1 stage I invasive ductal carcinoma measuring 0.5 cm ER positive PR positive HER-2/neu negative. Patient is status post lumpectomy 04/22/2010 followed by partial breast radiation. She was then begun on adjuvant tamoxifen 20 mg daily overall she seems to be doing well her hot flashes are stable.     PLAN:  #1Doing well continue tamoxifen daily  #2 We discussed the possibility of starting patient on prolia for your thinning bones  #3 we will continue to do bone densityscan her every 2 years  #4 I will see you back in 6 months   All questions were answered. The patient knows to call the clinic with any problems, questions or concerns. We can certainly see the patient much sooner if necessary.  I spent 15 minutes counseling the patient face to face. The total time spent in the appointment was 30 minutes.    Marcy Panning, MD Medical/Oncology [facility] [phone] (beeper) [phone] (Office)  03/02/2013, 12:22 PM

## 2013-03-02 NOTE — Patient Instructions (Addendum)
Doing well continue tamoxifen daily  We discussed the possibility of starting you on prolia for your thinning bones  We will continue watching your bones density scnas periodically  Iwill see you back in 6 months

## 2013-03-14 ENCOUNTER — Encounter: Payer: Self-pay | Admitting: Family Medicine

## 2013-03-14 ENCOUNTER — Ambulatory Visit (INDEPENDENT_AMBULATORY_CARE_PROVIDER_SITE_OTHER): Payer: Medicare Other | Admitting: Family Medicine

## 2013-03-14 VITALS — BP 139/82 | HR 103 | Ht 61.75 in | Wt 209.0 lb

## 2013-03-14 DIAGNOSIS — E119 Type 2 diabetes mellitus without complications: Secondary | ICD-10-CM

## 2013-03-14 DIAGNOSIS — Z0000 Encounter for general adult medical examination without abnormal findings: Secondary | ICD-10-CM

## 2013-03-14 MED ORDER — FLUTICASONE PROPIONATE 50 MCG/ACT NA SUSP: 1.0000 | Freq: Every day | NASAL | Status: DC

## 2013-03-14 NOTE — Patient Instructions (Signed)
Keep up a regular exercise program and make sure you are eating a healthy diet Try to eat 4 servings of dairy a day, or if you are lactose intolerant take a calcium with vitamin D daily.  Your vaccines are up to date.   

## 2013-03-14 NOTE — Progress Notes (Addendum)
Subjective:    Tanya Hogan is a 62 y.o. female who presents for a welcome to Medicare exam.   Has been coughing up white chunks for several weeks. Worse in AM. Says not from her chest.  Say coming from her sinuses.  Has been having hotflashes. No HA or facial pain.  She is maxed on her allergy therapy.    Cardiac risk factors: diabetes mellitus, dyslipidemia, hypertension and CHF.  Activities of Daily Living  In your present state of health, do you have any difficulty performing the following activities?:  Preparing food and eating?: No Bathing yourself: No Getting dressed: No Using the toilet:No Moving around from place to place: No In the past year have you fallen or had a near fall?:Yes  Current exercise habits: goes to the [facility]   Dietary issues discussed: none   Depression Screen (Note: if answer to either of the following is "Yes", then a more complete depression screening is indicated)  Q1: Over the past two weeks, have you felt down, depressed or hopeless?no Q2: Over the past two weeks, have you felt little interest or pleasure in doing things? no   The following portions of the patient's history were reviewed and updated as appropriate: allergies, current medications, past family history, past medical history, past social history, past surgical history and problem list. Review of Systems A comprehensive review of systems was negative except for: Headache    Objective:     [facility] November 2013.   Blood pressure 139/82, pulse 103, height 5' 1.75" (1.568 m), weight 209 lb (94.802 kg). Body mass index is 38.56 kg/(m^2). BP 139/82  Pulse 103  Ht 5' 1.75" (1.568 m)  Wt 209 lb (94.802 kg)  BMI 38.56 kg/m2  General Appearance:    Alert, cooperative, no distress, appears stated age  Head:    Normocephalic, without obvious abnormality, atraumatic  Eyes:    PERRL, conjunctiva/corneas clear, EOM's intact, both eyes  Ears:    Normal TM's and external ear  canals, both ears  Nose:   Nares normal, septum midline, mucosa normal, no drainage    or sinus tenderness  Throat:   Lips, mucosa, and tongue normal; teeth and gums normal  Neck:   Supple, symmetrical, trachea midline, no adenopathy;    thyroid:  no enlargement/tenderness/nodules; no carotid   bruit or JVD  Back:     Symmetric, no curvature, ROM normal, no CVA tenderness  Lungs:     Clear to auscultation bilaterally, respirations unlabored  Chest Wall:    No tenderness or deformity   Heart:    Regular rate and rhythm, S1 and S2 normal, no murmur, rub   or gallop  Breast Exam:    Not performed.    Abdomen:     Soft, non-tender, bowel sounds active all four quadrants,    no masses, no organomegaly  Genitalia:    Not performed  Rectal:    Not performed.   Extremities:   Extremities normal, atraumatic, no cyanosis or edema  Pulses:   2+ and symmetric all extremities  Skin:   Skin color, texture, turgor normal, no rashes or lesions  Lymph nodes:   Cervical, supraclavicular, and axillary nodes normal  Neurologic:   CNII-XII intact, normal strength, sensation and reflexes    throughout      Assessment:   Medicare Wellness Exam     Plan:     During the course of the visit the patient was educated and counseled about appropriate screening  and preventive services including:   eye exam UTD  DM- well controlled.  F/u in 3 months.   Mammo due next month.   EKG shows rate of 87 beats per minute, she has an ventricular pacemaker..  Patient Instructions (the written plan) was given to the patient.

## 2013-03-16 LAB — LIPID PANEL: LDL Cholesterol: 90 mg/dL (ref 0–99)

## 2013-03-17 ENCOUNTER — Other Ambulatory Visit: Payer: Self-pay | Admitting: Family Medicine

## 2013-03-17 MED ORDER — DULOXETINE HCL 60 MG PO CPEP: 60.0000 mg | ORAL_CAPSULE | Freq: Every day | ORAL | Status: DC

## 2013-03-21 ENCOUNTER — Encounter: Payer: Self-pay | Admitting: *Deleted

## 2013-03-29 ENCOUNTER — Telehealth: Payer: Self-pay | Admitting: Oncology

## 2013-03-29 NOTE — Telephone Encounter (Signed)
Faxed pt medical records to [facility]

## 2013-03-31 ENCOUNTER — Ambulatory Visit (INDEPENDENT_AMBULATORY_CARE_PROVIDER_SITE_OTHER): Payer: Medicare Other | Admitting: Family Medicine

## 2013-03-31 ENCOUNTER — Other Ambulatory Visit: Payer: Self-pay | Admitting: Family Medicine

## 2013-03-31 ENCOUNTER — Telehealth: Payer: Self-pay | Admitting: Family Medicine

## 2013-03-31 ENCOUNTER — Encounter: Payer: Self-pay | Admitting: Family Medicine

## 2013-03-31 VITALS — BP 129/80 | HR 101 | Wt 207.0 lb

## 2013-03-31 DIAGNOSIS — F329 Major depressive disorder, single episode, unspecified: Secondary | ICD-10-CM

## 2013-03-31 DIAGNOSIS — N951 Menopausal and female climacteric states: Secondary | ICD-10-CM

## 2013-03-31 DIAGNOSIS — R232 Flushing: Secondary | ICD-10-CM

## 2013-03-31 DIAGNOSIS — J449 Chronic obstructive pulmonary disease, unspecified: Secondary | ICD-10-CM

## 2013-03-31 DIAGNOSIS — F3289 Other specified depressive episodes: Secondary | ICD-10-CM

## 2013-03-31 MED ORDER — FLUTICASONE PROPIONATE 50 MCG/ACT NA SUSP: 1.0000 | Freq: Every day | NASAL | Status: DC

## 2013-03-31 MED ORDER — VENLAFAXINE HCL 37.5 MG PO TABS: 37.5000 mg | ORAL_TABLET | Freq: Two times a day (BID) | ORAL | Status: DC

## 2013-03-31 NOTE — Telephone Encounter (Signed)
Please call patient and let her know that with the Effexor, we will need to increase her Cymbalta as well. These 2 medications can and her fear with each other. Recommend decrease Cymbalta to 2 tabs daily for 5 days and then drop down to 1 tab daily for 5 days and then discontinue. If she decides that she wants to keep the Cymbalta and not try the Effexor and just stay with the Wellbutrin and we can increase the Wellbutrin if she would like.

## 2013-03-31 NOTE — Patient Instructions (Addendum)
Cut the wellbutrin down to every other day for one week an d then stop it.

## 2013-03-31 NOTE — Telephone Encounter (Signed)
Pt informed of changes. She stated that she has done well on the cymbalta and does not want to change to effexor. And would like to increase the wellbutrin. Maryruth Eve, [name]

## 2013-03-31 NOTE — Telephone Encounter (Signed)
Dr. Madilyn Fireman I do not see the advair on her list of meds

## 2013-03-31 NOTE — Progress Notes (Signed)
  Subjective:    Patient ID: Tanya Hogan, female    DOB: 1950/09/18, 61 y.o.   MRN: [id number]  HPI F/U  On mood. Doing well on the wellbutrin. Does feel it is some helpful. Still miserable with hotflashes.  She haerd about a med that cna help her mood swings and hot flashes.  Is very disappointed that she will have to stay on her medication for her breast cancer. She will start for 2 more years. It tends to cause hot flashes which are making her miserable.   Review of Systems     Objective:   Physical Exam  Constitutional: She is oriented to person, place, and time. She appears well-developed and well-nourished.  HENT:  Head: Normocephalic and atraumatic.  Cardiovascular: Normal rate, regular rhythm and normal heart sounds.   Pulmonary/Chest: Effort normal and breath sounds normal.  Neurological: She is alert and oriented to person, place, and time.  Skin: Skin is warm and dry.  Psychiatric: She has a normal mood and affect. Her behavior is normal.          Assessment & Plan:  F/u mood - discussed options.  Could change to effexor to help with hotflashes. Wean the wellubrin. Followup in about 5 weeks to make sure that she's tolerating it well. Hopefully we'll be helpful for mood swings as well.   Time spent 20 minutes, greater than 50% spent counseling about mood

## 2013-03-31 NOTE — Telephone Encounter (Signed)
Call her and see why on advair.  i really am not sure. Does she have COPD or asthma. I don't have either documented on her list.  When was the last time she had a breathing test?

## 2013-04-02 MED ORDER — BUPROPION HCL ER (XL) 300 MG PO TB24: 300.0000 mg | ORAL_TABLET | Freq: Every day | ORAL | Status: DC

## 2013-04-03 DIAGNOSIS — J441 Chronic obstructive pulmonary disease with (acute) exacerbation: Secondary | ICD-10-CM | POA: Insufficient documentation

## 2013-04-03 NOTE — Telephone Encounter (Signed)
Pt states yes she takes it and usually Dr. Clarene Essex prescribes it.  I don't know how to route this to his office. Clemetine Marker, LPN

## 2013-04-03 NOTE — Telephone Encounter (Signed)
Called and LM on VM to fax last OV notes. Clemetine Marker, LPN

## 2013-04-03 NOTE — Telephone Encounter (Signed)
Ok rx sent. Let's call Dr. Prudy Feeler office for last OV notes.

## 2013-04-06 ENCOUNTER — Telehealth: Payer: Self-pay | Admitting: *Deleted

## 2013-04-06 NOTE — Telephone Encounter (Signed)
Ok to go back down on Wellbutrin. Can try claritin instead of zyrtec, as less sedating.

## 2013-04-06 NOTE — Telephone Encounter (Signed)
Pt called and stated that the "happy" pill (wellbutrin) that Dr.[name] gave her made lightheaded, drunk, she was unable to operate her car while taking this med, she stopped taking the 300 mg and went back down to 150 mg and is doing better and feeling better. She also wanted to let Dr.[name] know that she cannot take the zyrtec because it causes her to sleep for 16 hours.Tanya Hogan, Tanya Hogan

## 2013-04-06 NOTE — Telephone Encounter (Signed)
Pt notified ok to stay on lower dose of Wellbutrin and to try Claritin instead of zyrtec. Clemetine Marker, LPN

## 2013-04-10 ENCOUNTER — Emergency Department
Admission: EM | Admit: 2013-04-10 | Discharge: 2013-04-10 | Disposition: A | Discharge status: Home / Self Care | Payer: Medicare Other | Source: Home / Self Care | Attending: Family Medicine | Admitting: Family Medicine

## 2013-04-10 ENCOUNTER — Emergency Department (INDEPENDENT_AMBULATORY_CARE_PROVIDER_SITE_OTHER): Payer: Medicare Other

## 2013-04-10 ENCOUNTER — Encounter: Payer: Self-pay | Admitting: *Deleted

## 2013-04-10 DIAGNOSIS — M79609 Pain in unspecified limb: Secondary | ICD-10-CM

## 2013-04-10 DIAGNOSIS — S93609A Unspecified sprain of unspecified foot, initial encounter: Secondary | ICD-10-CM

## 2013-04-10 DIAGNOSIS — S93602A Unspecified sprain of left foot, initial encounter: Secondary | ICD-10-CM

## 2013-04-10 MED ORDER — MELOXICAM 15 MG PO TABS: 15.0000 mg | ORAL_TABLET | Freq: Every day | ORAL | Status: DC

## 2013-04-10 NOTE — ED Notes (Signed)
Pt c/o LT foot injury x 1 hr ago. She reports "rolling it" at home.

## 2013-04-10 NOTE — ED Provider Notes (Signed)
CSN: [id number]     Arrival date & time 04/10/13  1627 History     First MD Initiated Contact with Patient 04/10/13 1634     Chief Complaint  Patient presents with  . Foot Injury    HPI Foot pain x 1 day Pt accidentally rolled L foot, putting all of her weight on her big toe before rolling  Has had marked R great toe pain since this point.  No numbness Minimal swelling   Past Medical History  Diagnosis Date  . HTN (hypertension)   . CHF (congestive heart failure)   . LBBB (left bundle branch block)   . Fibromyalgia   . Alkaline phosphatase elevation   . Hypothyroidism   . Closed fracture of unspecified part of radius (alone)   . Depression   . DDD (degenerative disc disease), lumbar   . Carpal tunnel syndrome   . IBS (irritable bowel syndrome)   . Breast cancer    Past Surgical History  Procedure Laterality Date  . Pacemaker placement  2009    [facility] cardiology  . Knee surgery    . Foot surgery    . Tubal ligation    . Breast lumpectomy     Family History  Problem Relation Age of Onset  . Heart failure    . Breast cancer    . Hypertension    . Melanoma Mother    History  Substance Use Topics  . Smoking status: Former Smoker    Quit date: 08/24/1966  . Smokeless tobacco: Not on file  . Alcohol Use: No   OB History   Grav Para Term Preterm Abortions TAB SAB Ect Mult Living                 Review of Systems  All other systems reviewed and are negative.    Allergies  Accupril; Baking soda-fluoride; Clindamycin; Codeine; Losartan potassium; Nasonex; Penicillins; Quinine; Rifampin; Sulfonamide derivatives; and Miralax  Home Medications   Current Outpatient Rx  Name  Route  Sig  Dispense  Refill  . acetaminophen (ARTHRITIS PAIN RELIEF) 650 MG CR tablet   Oral   Take 650 mg by mouth every 8 (eight) hours as needed.           [name] ADVAIR DISKUS 250-50 MCG/DOSE AEPB               . ADVAIR DISKUS 250-50 MCG/DOSE AEPB      Inhale 1 puff twice  daily   180 each   1   . albuterol (PROAIR HFA) 108 (90 BASE) MCG/ACT inhaler   Inhalation   Inhale 2 puffs into the lungs every 6 (six) hours as needed.   18 g   2   . AMBULATORY NON FORMULARY MEDICATION      True Result test strips.  Diagnosis: Diabetes Mellitus  Testing 2 times a day   100 each   4   . AMBULATORY NON FORMULARY MEDICATION      Medication Name: Lantus Solostar Pen Needles TID Testing Diagnosis: Diabetes 250.0   100 each   5   . BD PEN NEEDLE NANO U/F 32G X 4 MM MISC      Test 3 times daily with  Lantus Solostar   270 each   PRN   . beclomethasone (QVAR) 40 MCG/ACT inhaler   Inhalation   Inhale 2 puffs into the lungs 2 (two) times daily.   1 Inhaler   6   . bumetanide (BUMEX) 1 MG  tablet   Oral   Take 1 tablet (1 mg total) by mouth 2 (two) times daily.   180 tablet   3   . buPROPion (WELLBUTRIN XL) 300 MG 24 hr tablet   Oral   Take 1 tablet (300 mg total) by mouth daily.   30 tablet   2   . BYSTOLIC 5 MG tablet      Take 1 tablet (5 mg total)  by mouth daily.   90 tablet   1   . cycloSPORINE (RESTASIS) 0.05 % ophthalmic emulsion      1 drop 2 (two) times daily.           . cyproheptadine (PERIACTIN) 4 MG tablet   Oral   Take 4 mg by mouth 3 (three) times daily as needed.         . DULoxetine (CYMBALTA) 60 MG capsule   Oral   Take 1 capsule (60 mg total) by mouth daily.   90 capsule   1   . esomeprazole (NEXIUM) 40 MG capsule   Oral   Take 1 capsule (40 mg total) by mouth daily before breakfast.   90 capsule   2   . fexofenadine (ALLEGRA) 180 MG tablet   Oral   Take 180 mg by mouth as needed.         . fluticasone (FLONASE) 50 MCG/ACT nasal spray   Nasal   Place 1 spray into the nose daily.   16 g   2   . gabapentin (NEURONTIN) 600 MG tablet      TAKE 1 TABLET THREE TIMES DAILY   270 tablet   PRN   . LANTUS SOLOSTAR 100 UNIT/ML SOPN      Inject subcutaneously 18  units at bedtime   15 pen   1   .  levothyroxine (SYNTHROID, LEVOTHROID) 75 MCG tablet      TAKE 1 TABLET EVERY DAY   90 tablet   PRN   . montelukast (SINGULAIR) 10 MG tablet   Oral   Take 10 mg by mouth at bedtime.           . pravastatin (PRAVACHOL) 40 MG tablet      Take 1 tablet (40 mg total) by mouth daily.   90 tablet   0   . tamoxifen (NOLVADEX) 20 MG tablet   Oral   Take 1 tablet (20 mg total) by mouth daily.   90 tablet   3   . tiZANidine (ZANAFLEX) 4 MG capsule   Oral   Take 4 mg by mouth daily.         [name] triamcinolone (KENALOG) 0.1 % paste   dental   Place onto teeth as needed.          BP 146/84  Pulse 79  Temp(Src) 97.6 F (36.4 C) (Oral)  Resp 18  SpO2 96% Physical Exam  Constitutional: She appears well-developed and well-nourished.  HENT:  Head: Normocephalic and atraumatic.  Eyes: Conjunctivae are normal. Pupils are equal, round, and reactive to light.  Neck: Normal range of motion. Neck supple.  Cardiovascular: Normal rate and regular rhythm.   Pulmonary/Chest: Effort normal.  Abdominal: Soft.  Musculoskeletal:       Feet:  Skin: Skin is warm.    ED Course   Procedures (including critical care time)  Labs Reviewed - No data to display Dg Foot Complete Left  04/10/2013   *RADIOLOGY REPORT*  Clinical Data:  Fall with lateral foot pain  LEFT FOOT - COMPLETE 3+ VIEW  Comparison: None.  Findings: No fracture or dislocation identified.  Small to moderate heel spur present.  8 mm circumscribed rounded lucent lesion involving the proximal shaft of the fifth metatarsal.  Mild cortical thickening noted along the lateral base of the fifth metatarsal. There is a subtle horizontal lucency on the oblique view involving the lateral cortex in this area.  IMPRESSION: No displaced acute fracture.  Cortical thickening involving the proximal fifth metatarsal can be seen with a stress injury in the appropriate clinical setting, and a subtle horizontal lucency can represent a stress fracture.   Correlate clinically in this regard.  8 mm lucent lesion with sclerotic borders proximal fifth metatarsal possibly a bone cyst.   Original Report Authenticated By: Skipper Cliche, M.D.   1. Foot sprain, left, initial encounter     MDM  Negative for any acute fracture over affected area.  Noted ? Stress fracture over 5th metatarsal- non tender over this area on exam. Will place in post op shoe. Tylenol for pain  (initially rxd mobic, but contraindicated as pt reports hx/o GI ulcers-discontinued).  Follow up with sports medicine in 1-2 weeks.  Discussed MSK red flags.    The patient and/or caregiver has been counseled thoroughly with regard to treatment plan and/or medications prescribed including dosage, schedule, interactions, rationale for use, and possible side effects and they verbalize understanding. Diagnoses and expected course of recovery discussed and will return if not improved as expected or if the condition worsens. Patient and/or caregiver verbalized understanding.        Shanda Howells, MD 04/10/13 [phone]

## 2013-04-17 ENCOUNTER — Other Ambulatory Visit: Payer: Self-pay | Admitting: Family Medicine

## 2013-04-18 ENCOUNTER — Encounter: Payer: Medicare Other | Admitting: Internal Medicine

## 2013-04-18 DIAGNOSIS — R053 Chronic cough: Secondary | ICD-10-CM | POA: Insufficient documentation

## 2013-04-18 DIAGNOSIS — R05 Cough: Secondary | ICD-10-CM | POA: Insufficient documentation

## 2013-04-18 DIAGNOSIS — J4540 Moderate persistent asthma, uncomplicated: Secondary | ICD-10-CM | POA: Insufficient documentation

## 2013-04-18 DIAGNOSIS — R0683 Snoring: Secondary | ICD-10-CM | POA: Insufficient documentation

## 2013-04-21 ENCOUNTER — Encounter: Payer: Self-pay | Admitting: Sports Medicine

## 2013-04-21 ENCOUNTER — Ambulatory Visit (INDEPENDENT_AMBULATORY_CARE_PROVIDER_SITE_OTHER): Payer: Medicare Other | Admitting: Sports Medicine

## 2013-04-21 VITALS — BP 146/91 | HR 83 | Wt 209.0 lb

## 2013-04-21 DIAGNOSIS — S92919A Unspecified fracture of unspecified toe(s), initial encounter for closed fracture: Secondary | ICD-10-CM

## 2013-04-21 DIAGNOSIS — S92425A Nondisplaced fracture of distal phalanx of left great toe, initial encounter for closed fracture: Secondary | ICD-10-CM | POA: Insufficient documentation

## 2013-04-21 NOTE — Progress Notes (Signed)
   Subjective:    I'm seeing this patient as a consultation for:  Dr. Ernestina Patches  CC: Toe pain  HPI: This is a very pleasant 62 year old female, she was seen approximately one week ago in the urgent care Center with left foot pain after suffering an inversion injury were all overweight came down onto her great toe. X-rays were done that were read by the radiologist as negative. She had persistent pain, swelling, and bruising on the great toe, she was placed in a postop shoe and referred to me for further evaluation and definitive treatment. Pain continues, moderate to severe, worse with ambulation, no radiation, persistent.  Past medical history, Surgical history, Family history not pertinant except as noted below, Social history, Allergies, and medications have been entered into the medical record, reviewed, and no changes needed.   Review of Systems: No headache, visual changes, nausea, vomiting, diarrhea, constipation, dizziness, abdominal pain, skin rash, fevers, chills, night sweats, weight loss, swollen lymph nodes, body aches, joint swelling, muscle aches, chest pain, shortness of breath, mood changes, visual or auditory hallucinations.   Objective:   General: Well Developed, well nourished, and in no acute distress.  Neuro/Psych: Alert and oriented x3, extra-ocular muscles intact, able to move all 4 extremities, sensation grossly intact. Skin: Warm and dry, no rashes noted.  Respiratory: Not using accessory muscles, speaking in full sentences, trachea midline.  Cardiovascular: Pulses palpable, no extremity edema. Abdomen: Does not appear distended. Left Foot: There is swelling and bruising present over the great toe and under the great toenail. Range of motion is full in all directions. Strength is 5/5 in all directions. No hallux valgus. No pes cavus or pes planus. No abnormal callus noted. No pain over the navicular prominence, or base of fifth metatarsal. No tenderness to palpation  of the calcaneal insertion of plantar fascia. No pain at the Achilles insertion. No pain over the calcaneal bursa. No pain of the retrocalcaneal bursa. There is severe tenderness to palpation over the medial aspect of the base of the first distal phalanx. No hallux rigidus or limitus. No tenderness palpation over interphalangeal joints. No pain with compression of the metatarsal heads. Neurovascularly intact distally.  On my personal review of the x-ray I do see a small fracture through the medial condyle of the base of the distal first phalanx, I called radiology and they will change the report to reflect the fracture.  I strapped the first and second toes.  Impression and Recommendations:   This case required medical decision making of moderate complexity.

## 2013-04-21 NOTE — Assessment & Plan Note (Addendum)
Fracture noted on x-ray based on my personal read, I have called radiology and they will amend their report. Buddy taped, continue postop shoe. She does not need any further pain medication. Return to see me in 3 weeks to see how things are going.   I billed a fracture code for this visit, all subsequent visits for this complaint will be "post-op checks" in the global period.

## 2013-04-25 ENCOUNTER — Other Ambulatory Visit: Payer: Self-pay | Admitting: *Deleted

## 2013-04-25 MED ORDER — BUPROPION HCL ER (XL) 300 MG PO TB24: 300.0000 mg | ORAL_TABLET | Freq: Every day | ORAL | Status: DC

## 2013-04-28 ENCOUNTER — Ambulatory Visit (INDEPENDENT_AMBULATORY_CARE_PROVIDER_SITE_OTHER): Payer: Medicare Other | Admitting: Family Medicine

## 2013-04-28 ENCOUNTER — Encounter: Payer: Self-pay | Admitting: Family Medicine

## 2013-04-28 VITALS — BP 135/61 | HR 84 | Wt 210.0 lb

## 2013-04-28 DIAGNOSIS — F329 Major depressive disorder, single episode, unspecified: Secondary | ICD-10-CM

## 2013-04-28 DIAGNOSIS — J449 Chronic obstructive pulmonary disease, unspecified: Secondary | ICD-10-CM

## 2013-04-28 DIAGNOSIS — J309 Allergic rhinitis, unspecified: Secondary | ICD-10-CM

## 2013-04-28 DIAGNOSIS — F3289 Other specified depressive episodes: Secondary | ICD-10-CM

## 2013-04-28 DIAGNOSIS — Z23 Encounter for immunization: Secondary | ICD-10-CM

## 2013-04-28 NOTE — Progress Notes (Signed)
  Subjective:    Patient ID: Tanya Hogan, female    DOB: [birth year], 62 y.o.   MRN: [id number]  HPI After much  discussion at the last office visit we decided to discontinue the Wellbutrin and switch her to Effexor to help some hot flashes that she's been experiencing on her tamoxifen. She says she's been miserable to the point that she is really him a swat to stop the tamoxifen. She is doing well on the Wellbutrin 166m.    Still having the hotflashes from the tamoxifen.  She is getting chin hairs. Hair is brittle.  Hasn't been able to lose weight.  Sleep is getting better.    AR/nasal irritation- Saw allergist and starting using mupiricin oint in nose and has helped.   She is now seeing a pulmonologist to recently prescribed a Spiriva.   Review of Systems     Objective:   Physical Exam  Constitutional: She is oriented to person, place, and time. She appears well-developed and well-nourished.  HENT:  Head: Normocephalic and atraumatic.  Cardiovascular: Normal rate, regular rhythm and normal heart sounds.   Pulmonary/Chest: Effort normal and breath sounds normal.  Neurological: She is alert and oriented to person, place, and time.  Skin: Skin is warm and dry.  Psychiatric: She has a normal mood and affect. Her behavior is normal.          Assessment & Plan:  Mood is well controlled right now.  Continue wellbutrin 150g lower dose. There is some interction with her tamoxifen.  PHQ- 9 score of 1.   Tamoxifen - She is at the point where she is desperate and really wants to come off the medication. She plans on staying on it as she meets with her oncologist again.  Allergic rhinitis-she's currently on maximal therapy at this point. She is on Singulair, Allegra, nasal steroid spray. For COPD she's on Spiriva and Advair. She still has a fair amount of mucus production but says she does feel it is a little bit better. She is now seeing a pulmonologist to recently prescribed a  Spiriva.  Flu vaccine today.

## 2013-05-12 ENCOUNTER — Telehealth: Payer: Self-pay | Admitting: Family Medicine

## 2013-05-12 ENCOUNTER — Encounter: Payer: Self-pay | Admitting: Sports Medicine

## 2013-05-12 ENCOUNTER — Ambulatory Visit (INDEPENDENT_AMBULATORY_CARE_PROVIDER_SITE_OTHER): Payer: Medicare Other | Admitting: Sports Medicine

## 2013-05-12 VITALS — BP 128/78 | HR 105 | Wt 209.0 lb

## 2013-05-12 DIAGNOSIS — Z853 Personal history of malignant neoplasm of breast: Secondary | ICD-10-CM

## 2013-05-12 DIAGNOSIS — S92425D Nondisplaced fracture of distal phalanx of left great toe, subsequent encounter for fracture with routine healing: Secondary | ICD-10-CM

## 2013-05-12 NOTE — Telephone Encounter (Signed)
Order placed, they should be contacting her soon

## 2013-05-12 NOTE — Progress Notes (Signed)
  Subjective: 4 weeks status post fracture at the base of the first distal phalanx on the left foot. Pain-free.   Objective: General: Well-developed, well-nourished, and in no acute distress. Left Foot: No visible erythema or swelling. Range of motion is full in all directions. Strength is 5/5 in all directions. No hallux valgus. No pes cavus or pes planus. No abnormal callus noted. No pain over the navicular prominence, or base of fifth metatarsal. No tenderness to palpation of the calcaneal insertion of plantar fascia. No pain at the Achilles insertion. No pain over the calcaneal bursa. No pain of the retrocalcaneal bursa. No tenderness to palpation over the tarsals, metatarsals, or phalanges. No hallux rigidus or limitus. No tenderness palpation over interphalangeal joints. No pain with compression of the metatarsal heads. Neurovascularly intact distally.  Assessment/plan:

## 2013-05-12 NOTE — Telephone Encounter (Signed)
Patient request to have a mammogram order and she states needs to go to [place]... Thanks

## 2013-05-12 NOTE — Assessment & Plan Note (Signed)
Healed, return as needed.

## 2013-05-15 ENCOUNTER — Other Ambulatory Visit: Payer: Self-pay | Admitting: *Deleted

## 2013-05-15 MED ORDER — AMBULATORY NON FORMULARY MEDICATION: Status: DC

## 2013-05-16 NOTE — Telephone Encounter (Signed)
Pt informed.Tanya Hogan Tanya Hogan  

## 2013-05-23 ENCOUNTER — Encounter: Payer: Self-pay | Admitting: Internal Medicine

## 2013-05-23 ENCOUNTER — Ambulatory Visit (INDEPENDENT_AMBULATORY_CARE_PROVIDER_SITE_OTHER): Payer: Medicare Other | Admitting: Internal Medicine

## 2013-05-23 VITALS — BP 136/68 | HR 103 | Ht 61.0 in | Wt 208.0 lb

## 2013-05-23 DIAGNOSIS — I509 Heart failure, unspecified: Secondary | ICD-10-CM

## 2013-05-23 DIAGNOSIS — Z950 Presence of cardiac pacemaker: Secondary | ICD-10-CM

## 2013-05-23 DIAGNOSIS — E669 Obesity, unspecified: Secondary | ICD-10-CM

## 2013-05-23 DIAGNOSIS — I5022 Chronic systolic (congestive) heart failure: Secondary | ICD-10-CM | POA: Insufficient documentation

## 2013-05-23 LAB — PACEMAKER DEVICE OBSERVATION
AL AMPLITUDE: 2.8 mv
ATRIAL PACING PM: 6
BAMS-0001: 150 {beats}/min
BATTERY VOLTAGE: 2.928 V
LV LEAD THRESHOLD: 1 V
RV LEAD IMPEDENCE PM: 675 Ohm
RV LEAD THRESHOLD: 1 V
VENTRICULAR PACING PM: 97

## 2013-05-23 NOTE — Patient Instructions (Addendum)
Your physician wants you to follow-up in: 12 months with Dr Knox Saliva will receive a reminder letter in the mail two months in advance. If you don't receive a letter, please call our office to schedule the follow-up appointment.   Remote monitoring is used to monitor your Pacemaker or ICD from home. This monitoring reduces the number of office visits required to check your device to one time per year. It allows [place] to keep an eye on the functioning of your device to ensure it is working properly. You are scheduled for a device check from home on 08/25/13. You may send your transmission at any time that day. If you have a wireless device, the transmission will be sent automatically. After your physician reviews your transmission, you will receive a postcard with your next transmission date.

## 2013-05-23 NOTE — Progress Notes (Signed)
HPI [name] returns today for followup. She is pleasant 62 year old woman with a history of obesity, hypertension, chronic systolic heart failure, status post biventricular pacemaker insertion. She denies chest pain, shortness of breath, or peripheral edema. No syncope. She is been bothered by low blood pressure. She was unable take ACE inhibitors or ARB's secondary to hyperkalemia. In the interim, she has had problems with a broken toe, and been diagnosed with sleep apnea. She has not yet received her CPAP machine. She denies chest pain. She has class II heart failure symptoms. She denies peripheral edema. No syncope or palpitations to speak of. Allergies  Allergen Reactions  . Accupril [Quinapril Hcl] Other (See Comments)    Hyperkalemia  . Baking Soda-Fluoride [Sodium Fluoride] Itching  . Clindamycin   . Codeine   . Losartan Potassium Itching  . Nasonex [Mometasone Furoate] Other (See Comments)    nosebleed  . Penicillins   . Quinine   . Rifampin   . Sulfonamide Derivatives   . Miralax [Polyethylene Glycol] Rash     Current Outpatient Prescriptions  Medication Sig Dispense Refill  . acetaminophen (ARTHRITIS PAIN RELIEF) 650 MG CR tablet Take 650 mg by mouth every 8 (eight) hours as needed.        [name] ADVAIR DISKUS 250-50 MCG/DOSE AEPB Inhale 1 puff twice daily  180 each  1  . albuterol (PROAIR HFA) 108 (90 BASE) MCG/ACT inhaler Inhale 2 puffs into the lungs every 6 (six) hours as needed.  18 g  2  . AMBULATORY NON FORMULARY MEDICATION Medication Name: Lantus Solostar Pen Needles TID Testing Diagnosis: Diabetes 250.0  100 each  5  . AMBULATORY NON FORMULARY MEDICATION True Result test strips.  Diagnosis: Diabetes Mellitus  Testing 2 times a day  100 each  4  . BD PEN NEEDLE NANO U/F 32G X 4 MM MISC Test 3 times daily with  Lantus Solostar  270 each  PRN  . bumetanide (BUMEX) 1 MG tablet Take 1 tablet (1 mg total) by mouth 2 (two) times daily.  180 tablet  3  . buPROPion (WELLBUTRIN  XL) 300 MG 24 hr tablet Take 1 tablet (300 mg total) by mouth daily.  90 tablet  2  . BYSTOLIC 5 MG tablet Take 1 tablet (5 mg total)  by mouth daily.  90 tablet  1  . cycloSPORINE (RESTASIS) 0.05 % ophthalmic emulsion 1 drop 2 (two) times daily.        . cyproheptadine (PERIACTIN) 4 MG tablet Take 4 mg by mouth 3 (three) times daily as needed.      . DULoxetine (CYMBALTA) 60 MG capsule Take 1 capsule (60 mg total) by mouth daily.  90 capsule  1  . esomeprazole (NEXIUM) 40 MG capsule Take 1 capsule (40 mg total) by mouth daily before breakfast.  90 capsule  2  . fexofenadine (ALLEGRA) 180 MG tablet Take 180 mg by mouth as needed.      . fluticasone (FLONASE) 50 MCG/ACT nasal spray Place 1 spray into the nose daily.  16 g  2  . gabapentin (NEURONTIN) 600 MG tablet TAKE 1 TABLET THREE TIMES DAILY  270 tablet  PRN  . LANTUS SOLOSTAR 100 UNIT/ML SOPN Inject subcutaneously 18  units at bedtime  15 pen  1  . levothyroxine (SYNTHROID, LEVOTHROID) 75 MCG tablet TAKE 1 TABLET EVERY DAY  90 tablet  PRN  . meloxicam (MOBIC) 15 MG tablet Take 1 tablet (15 mg total) by mouth daily.  30 tablet  1  . montelukast (SINGULAIR) 10 MG tablet Take 1 tablet by mouth daily.      . mupirocin ointment (BACTROBAN) 2 %       . pravastatin (PRAVACHOL) 40 MG tablet Take 1 tablet (40 mg total) by mouth daily.  90 tablet  0  . SPIRIVA HANDIHALER 18 MCG inhalation capsule Place 1 capsule into inhaler and inhale daily.      . tamoxifen (NOLVADEX) 20 MG tablet Take 1 tablet (20 mg total) by mouth daily.  90 tablet  3  . tiZANidine (ZANAFLEX) 4 MG capsule Take 4 mg by mouth daily.      [name] triamcinolone (KENALOG) 0.1 % paste Place onto teeth as needed.       No current facility-administered medications for this visit.     Past Medical History  Diagnosis Date  . HTN (hypertension)   . CHF (congestive heart failure)   . LBBB (left bundle branch block)   . Fibromyalgia   . Alkaline phosphatase elevation   . Hypothyroidism    . Closed fracture of unspecified part of radius (alone)   . Depression   . DDD (degenerative disc disease), lumbar   . Carpal tunnel syndrome   . IBS (irritable bowel syndrome)   . Breast cancer     ROS:   All systems reviewed and negative except as noted in the HPI.   Past Surgical History  Procedure Laterality Date  . Pacemaker placement  2009    [facility] cardiology  . Knee surgery    . Foot surgery    . Tubal ligation    . Breast lumpectomy       Family History  Problem Relation Age of Onset  . Heart failure    . Breast cancer    . Hypertension    . Melanoma Mother      History   Social History  . Marital Status: Divorced    Spouse Name: N/A    Number of Children: N/A  . Years of Education: N/A   Occupational History  . Not on file.   Social History Main Topics  . Smoking status: Former Smoker    Quit date: 08/24/1966  . Smokeless tobacco: Not on file  . Alcohol Use: No  . Drug Use: No  . Sexual Activity: Not Currently   Other Topics Concern  . Not on file   Social History Narrative  . No narrative on file     There were no vitals taken for this visit.  Physical Exam:  Well appearing 13 old woman, NAD HEENT: Unremarkable Neck:   7 cm JVD, no thyromegally Lungs:  Clear with no wheezes, rales, or rhonchi. HEART:  Regular rate rhythm, no murmurs, no rubs, no clicks Abd:  soft, positive bowel sounds, no organomegally, no rebound, no guarding Ext:  2 plus pulses, no edema, no cyanosis, no clubbing Skin:  No rashes no nodules Neuro:  CN II through XII intact, motor grossly intact  DEVICE  Normal device function.  See PaceArt for details.   Assess/Plan:

## 2013-05-23 NOTE — Assessment & Plan Note (Signed)
Her chronic systolic heart failure symptoms are class II. She is allergic to ACE inhibitor and ARB drugs, and she has been intolerant to beta blocker therapy. She has fairly severe asthma.

## 2013-05-23 NOTE — Assessment & Plan Note (Signed)
We discussed the importance of a low fat diet and weight loss. She will work on both.

## 2013-05-23 NOTE — Assessment & Plan Note (Signed)
Her Medtronic biventricular pacemaker is working normally. We'll plan to recheck in several months.

## 2013-05-31 ENCOUNTER — Encounter: Payer: Self-pay | Admitting: Internal Medicine

## 2013-06-01 ENCOUNTER — Ambulatory Visit
Admission: RE | Admit: 2013-06-01 | Discharge: 2013-06-01 | Disposition: A | Discharge status: Home / Self Care | Payer: Medicare Other | Source: Ambulatory Visit | Attending: Family Medicine | Admitting: Family Medicine

## 2013-06-01 DIAGNOSIS — Z853 Personal history of malignant neoplasm of breast: Secondary | ICD-10-CM

## 2013-06-02 ENCOUNTER — Other Ambulatory Visit: Payer: Self-pay | Admitting: Family Medicine

## 2013-06-08 DIAGNOSIS — L0291 Cutaneous abscess, unspecified: Secondary | ICD-10-CM | POA: Insufficient documentation

## 2013-06-08 DIAGNOSIS — L0390 Cellulitis, unspecified: Secondary | ICD-10-CM

## 2013-06-08 DIAGNOSIS — G4733 Obstructive sleep apnea (adult) (pediatric): Secondary | ICD-10-CM | POA: Insufficient documentation

## 2013-06-09 ENCOUNTER — Encounter: Payer: Self-pay | Admitting: Family Medicine

## 2013-06-09 ENCOUNTER — Ambulatory Visit (INDEPENDENT_AMBULATORY_CARE_PROVIDER_SITE_OTHER): Payer: Medicare Other | Admitting: Family Medicine

## 2013-06-09 VITALS — BP 128/70 | HR 93 | Wt 209.0 lb

## 2013-06-09 DIAGNOSIS — F329 Major depressive disorder, single episode, unspecified: Secondary | ICD-10-CM

## 2013-06-09 DIAGNOSIS — J449 Chronic obstructive pulmonary disease, unspecified: Secondary | ICD-10-CM

## 2013-06-09 DIAGNOSIS — E119 Type 2 diabetes mellitus without complications: Secondary | ICD-10-CM

## 2013-06-09 DIAGNOSIS — I10 Essential (primary) hypertension: Secondary | ICD-10-CM

## 2013-06-09 DIAGNOSIS — IMO0001 Reserved for inherently not codable concepts without codable children: Secondary | ICD-10-CM | POA: Insufficient documentation

## 2013-06-09 LAB — POCT GLYCOSYLATED HEMOGLOBIN (HGB A1C): Hemoglobin A1C: 6.2

## 2013-06-09 MED ORDER — TIOTROPIUM BROMIDE MONOHYDRATE 18 MCG IN CAPS: 1.0000 | ORAL_CAPSULE | Freq: Every day | RESPIRATORY_TRACT | Status: DC

## 2013-06-09 NOTE — Progress Notes (Signed)
Subjective:    Patient ID: Tanya Hogan, female    DOB: July 06, 1951, 62 y.o.   MRN: [id number]  HPI DM- No hypoglycemic events. No wounds that aren't healing well. Sugars have been up a little.  Has been running around 117-120s.  Taking medications as prescribed.  HTN -  Pt denies chest pain, SOB, dizziness, or heart palpitations.  Taking meds as directed w/o problems.  Denies medication side effects.    Got a blister on her chin after wearing CPAP mask during her sleep study.  Sys it got infected. Saw doctor yesterday and put on cleomicin TID.  Now that she knows about her OSA she feels her mood is actually better. She says she has felt better within 2-3 days after being treated.  She discontinued the wellbutrin but still on the the Cymbalta.  Also using mupirin ointment.  Says the wound drianed on its own last night.   COPD-she's been started on Spiriva. This was at her medication list. She's been tolerating it well. she has been rinsing her throat after use.  Depression-she says the pharmacy sent a prescription for Wellbutrin for her milligrams to her. She was on 150 mg if she starts putting them. She took them for about a week and did not feel well so stopped them. She still taking the Cymbalta.  Review of Systems BP 128/70  Pulse 93  Wt 209 lb (94.802 kg)  BMI 39.51 kg/m2    Allergies  Allergen Reactions  . Accupril [Quinapril Hcl] Other (See Comments)    Hyperkalemia  . Baking Soda-Fluoride [Sodium Fluoride] Itching  . Clindamycin   . Codeine   . Losartan Potassium Itching  . Nasonex [Mometasone Furoate] Other (See Comments)    nosebleed  . Penicillins   . Quinine   . Rifampin   . Sulfonamide Derivatives   . Miralax [Polyethylene Glycol] Rash    Past Medical History  Diagnosis Date  . HTN (hypertension)   . CHF (congestive heart failure)   . LBBB (left bundle branch block)   . Fibromyalgia   . Alkaline phosphatase elevation   . Hypothyroidism   . Closed fracture  of unspecified part of radius (alone)   . Depression   . DDD (degenerative disc disease), lumbar   . Carpal tunnel syndrome   . IBS (irritable bowel syndrome)   . Breast cancer     Past Surgical History  Procedure Laterality Date  . Pacemaker placement  2009    [facility] cardiology  . Knee surgery    . Foot surgery    . Tubal ligation    . Breast lumpectomy      History   Social History  . Marital Status: Divorced    Spouse Name: N/A    Number of Children: N/A  . Years of Education: N/A   Occupational History  . Not on file.   Social History Main Topics  . Smoking status: Former Smoker    Quit date: 08/24/1966  . Smokeless tobacco: Not on file  . Alcohol Use: No  . Drug Use: No  . Sexual Activity: Not Currently   Other Topics Concern  . Not on file   Social History Narrative  . No narrative on file    Family History  Problem Relation Age of Onset  . Heart failure    . Breast cancer    . Hypertension    . Melanoma Mother     Outpatient Encounter Prescriptions as of 06/09/2013  Medication Sig  Dispense Refill  . ACCU-CHEK SMARTVIEW test strip       . acetaminophen (ARTHRITIS PAIN RELIEF) 650 MG CR tablet Take 650 mg by mouth every 8 (eight) hours as needed.        [name] ADVAIR DISKUS 250-50 MCG/DOSE AEPB Inhale 1 puff twice daily  180 each  1  . albuterol (PROAIR HFA) 108 (90 BASE) MCG/ACT inhaler Inhale 2 puffs into the lungs every 6 (six) hours as needed.  18 g  2  . AMBULATORY NON FORMULARY MEDICATION Medication Name: Lantus Solostar Pen Needles TID Testing Diagnosis: Diabetes 250.0  100 each  5  . AMBULATORY NON FORMULARY MEDICATION True Result test strips.  Diagnosis: Diabetes Mellitus  Testing 2 times a day  100 each  4  . BD PEN NEEDLE NANO U/F 32G X 4 MM MISC Test 3 times daily with  Lantus Solostar  270 each  PRN  . bumetanide (BUMEX) 1 MG tablet Take 1 tablet (1 mg total) by mouth 2 (two) times daily.  180 tablet  3  . buPROPion (WELLBUTRIN XL) 150 MG  24 hr tablet Take 150 mg by mouth daily.      [name] BYSTOLIC 5 MG tablet Take 1 tablet (5 mg total)  by mouth daily.  90 tablet  1  . cycloSPORINE (RESTASIS) 0.05 % ophthalmic emulsion 1 drop 2 (two) times daily.        . cyproheptadine (PERIACTIN) 4 MG tablet Take 4 mg by mouth 3 (three) times daily as needed.      . DULoxetine (CYMBALTA) 60 MG capsule Take 1 capsule (60 mg total) by mouth daily.  90 capsule  1  . esomeprazole (NEXIUM) 40 MG capsule Take 1 capsule (40 mg total) by mouth daily before breakfast.  90 capsule  2  . fexofenadine (ALLEGRA) 180 MG tablet Take 180 mg by mouth as needed.      . fluticasone (FLONASE) 50 MCG/ACT nasal spray Place 1 spray into the nose daily.  16 g  2  . gabapentin (NEURONTIN) 600 MG tablet TAKE 1 TABLET THREE TIMES DAILY  270 tablet  PRN  . LANTUS SOLOSTAR 100 UNIT/ML SOPN Inject subcutaneously 18   units at bedtime  30 pen  1  . levothyroxine (SYNTHROID, LEVOTHROID) 75 MCG tablet TAKE 1 TABLET EVERY DAY  90 tablet  PRN  . montelukast (SINGULAIR) 10 MG tablet Take 1 tablet by mouth daily.      . mupirocin ointment (BACTROBAN) 2 %       . pravastatin (PRAVACHOL) 40 MG tablet Take 1 tablet (40 mg total) by mouth daily.  90 tablet  0  . tamoxifen (NOLVADEX) 20 MG tablet Take 1 tablet (20 mg total) by mouth daily.  90 tablet  3  . tiotropium (SPIRIVA HANDIHALER) 18 MCG inhalation capsule Place 1 capsule (18 mcg total) into inhaler and inhale daily.  90 capsule  3  . tiZANidine (ZANAFLEX) 4 MG capsule Take 4 mg by mouth daily.      [name] triamcinolone (KENALOG) 0.1 % paste Place onto teeth as needed.      . [DISCONTINUED] SPIRIVA HANDIHALER 18 MCG inhalation capsule Place 1 capsule into inhaler and inhale daily.       No facility-administered encounter medications on file as of 06/09/2013.          Objective:   Physical Exam  Constitutional: She is oriented to person, place, and time. She appears well-developed and well-nourished.  HENT:  Head: Normocephalic  and atraumatic.  Right Ear: External ear normal.  Left Ear: External ear normal.  Nose: Nose normal.  Mouth/Throat: Oropharynx is clear and moist.  TMs and canals are clear.   Eyes: Conjunctivae and EOM are normal. Pupils are equal, round, and reactive to light.  Neck: Neck supple. No thyromegaly present.  Cardiovascular: Normal rate, regular rhythm and normal heart sounds.   Pulmonary/Chest: Effort normal and breath sounds normal. She has no wheezes.  Lymphadenopathy:    She has no cervical adenopathy.  Neurological: She is alert and oriented to person, place, and time.  Skin: Skin is warm and dry.  Psychiatric: She has a normal mood and affect.          Assessment & Plan:  DM  - well controlled. Continue current regimen.. Followup in 3 months. Lab Results  Component Value Date   HGBA1C 6.2 06/09/2013    HTN- well-controlled. Continue current regimen. Followup in 6 months.  Blister- does seem to be healing. She did start her antibiotics and infectious are draining on its own last night. After week if it is not continuing to improve and make an appointment to have it lanced. Her pulmonologist wanted away until the lesion has healed before starting to wear the CPAP.  COPD-she's been started on Spiriva. This was at her medication list. She's been tolerating it well. New prescriptions sent to her mail order.  Depression-am not sure why the Wellbutrin was over 300 mg. Nonetheless, she has stopped it. I removed it from her medication list. Continue with Cymbalta for now. Fortunately, her mood is actually been better since she was diagnosed with sleep apnea. I think it has actually taken weight off of her shoulders in having a potential diagnosis that explains some of her symptoms.

## 2013-06-19 ENCOUNTER — Other Ambulatory Visit: Payer: Self-pay | Admitting: Family Medicine

## 2013-06-19 MED ORDER — FLUTICASONE PROPIONATE 50 MCG/ACT NA SUSP: 1.0000 | Freq: Every day | NASAL | Status: DC

## 2013-07-05 ENCOUNTER — Other Ambulatory Visit: Payer: Self-pay | Admitting: Family Medicine

## 2013-07-26 ENCOUNTER — Other Ambulatory Visit: Payer: Self-pay | Admitting: Family Medicine

## 2013-08-16 ENCOUNTER — Encounter: Payer: Self-pay | Admitting: Family Medicine

## 2013-08-23 ENCOUNTER — Telehealth: Payer: Self-pay | Admitting: Internal Medicine

## 2013-08-23 NOTE — Telephone Encounter (Signed)
Patient is supposed to have her device remotely checked on 08-25-13.  She says she does not have a phone system to do the remote check.  Pls. Call patient regarding this device check.  Should she make an appointment instead?

## 2013-08-25 ENCOUNTER — Encounter: Payer: Medicare Other | Admitting: *Deleted

## 2013-08-25 NOTE — Telephone Encounter (Signed)
N/A--kwm

## 2013-08-31 ENCOUNTER — Telehealth: Payer: Self-pay | Admitting: Oncology

## 2013-08-31 ENCOUNTER — Encounter: Payer: Self-pay | Admitting: *Deleted

## 2013-08-31 NOTE — Telephone Encounter (Signed)
, °

## 2013-09-04 ENCOUNTER — Other Ambulatory Visit: Payer: Medicare Other

## 2013-09-04 ENCOUNTER — Ambulatory Visit: Payer: Medicare Other | Admitting: Oncology

## 2013-09-11 ENCOUNTER — Ambulatory Visit (INDEPENDENT_AMBULATORY_CARE_PROVIDER_SITE_OTHER): Payer: Medicare Other | Admitting: Family Medicine

## 2013-09-11 ENCOUNTER — Encounter: Payer: Self-pay | Admitting: Family Medicine

## 2013-09-11 VITALS — BP 124/69 | HR 94 | Temp 97.8°F | Ht 61.75 in | Wt 215.0 lb

## 2013-09-11 DIAGNOSIS — R498 Other voice and resonance disorders: Secondary | ICD-10-CM

## 2013-09-11 DIAGNOSIS — J329 Chronic sinusitis, unspecified: Secondary | ICD-10-CM

## 2013-09-11 DIAGNOSIS — E119 Type 2 diabetes mellitus without complications: Secondary | ICD-10-CM

## 2013-09-11 DIAGNOSIS — E039 Hypothyroidism, unspecified: Secondary | ICD-10-CM

## 2013-09-11 DIAGNOSIS — B3781 Candidal esophagitis: Secondary | ICD-10-CM

## 2013-09-11 DIAGNOSIS — I10 Essential (primary) hypertension: Secondary | ICD-10-CM

## 2013-09-11 DIAGNOSIS — E1149 Type 2 diabetes mellitus with other diabetic neurological complication: Secondary | ICD-10-CM

## 2013-09-11 DIAGNOSIS — R490 Dysphonia: Secondary | ICD-10-CM

## 2013-09-11 LAB — POCT GLYCOSYLATED HEMOGLOBIN (HGB A1C): Hemoglobin A1C: 6.4

## 2013-09-11 MED ORDER — METFORMIN HCL 500 MG PO TABS: 500.0000 mg | ORAL_TABLET | Freq: Two times a day (BID) | ORAL | Status: DC

## 2013-09-11 MED ORDER — FLUCONAZOLE 150 MG PO TABS
150.0000 mg | ORAL_TABLET | Freq: Every day | ORAL | Status: DC
Start: 2013-09-11 — End: 2013-12-14

## 2013-09-11 MED ORDER — FLUTICASONE-SALMETEROL 500-50 MCG/DOSE IN AEPB: 1.0000 | INHALATION_SPRAY | Freq: Two times a day (BID) | RESPIRATORY_TRACT | Status: DC

## 2013-09-11 NOTE — Progress Notes (Signed)
Subjective:    Patient ID: Tanya Hogan, female    DOB: 07-28-1951, 63 y.o.   MRN: [id number]  HPI Diabetes - no hypoglycemic events. No wounds or sores that are not healing well. No increased thirst or urination. Checking glucose at home. Taking medications as prescribed without any side effects.  Hypertension- Pt denies chest pain, SOB, dizziness, or heart palpitations.  Taking meds as directed w/o problems.  Denies medication side effects.    Hypothyroid - no recent weight changes.  No skin or hair changes.  Lab Results  Component Value Date   TSH 1.435 08/08/2012    COPD-Did see allergist and pulmonology gave her some samples of the Advair 500/50. She actually has felt some significant relief with the increased dose. Says has had more sputum production and says she feels the inhaler has helped her "clean our her lungs".  She has had a raspy voice for 3-4 weeks after having the flu. Has been coughing up white chunks.    Arhtiris has been flaring a littel more recently.   Sinusitis-she has been to see ENT, allergist, and pulmonary. She has been using Bactroban in her nose and has really helped. She actually used a few days in her ears as well and says it helped keep them open and she noticed that the moisture in her eyes has increased. She's wondering if she might even be able to come off the Restasis at some point. It is made of a difference in her sinus congestion and feeling like her ears are blocked. Review of Systems     Objective:   Physical Exam  Constitutional: She is oriented to person, place, and time. She appears well-developed and well-nourished.  HENT:  Head: Normocephalic and atraumatic.  Right Ear: External ear normal.  Left Ear: External ear normal.  Nose: Nose normal.  Mouth/Throat: Oropharynx is clear and moist.  Eyes: Conjunctivae are normal. Pupils are equal, round, and reactive to light.  Neck: Neck supple. No thyromegaly present.  Cardiovascular: Normal  rate, regular rhythm and normal heart sounds.   Pulmonary/Chest: Effort normal and breath sounds normal.  Lymphadenopathy:    She has no cervical adenopathy.  Neurological: She is alert and oriented to person, place, and time.  Skin: Skin is warm and dry.  Psychiatric: She has a normal mood and affect. Her behavior is normal.          Assessment & Plan:  Diabetes with peripheral neuropathy- A1c is 6.4 today. Well controlled. Continue current regimen. Followup in 3 months.  On statin.  Intolerant to ACE inhibitors and ARBs.  Hypertension- Well controlled. Continue current regimen. Followup in 3 months.  Hypothyroidism-due to recheck TSH today.  Arthritis-has been flaring recently.  Cystitis-okay to continue Bactroban in the nose and K. to use in the ears if needed. Do not use for more than 2 weeks of the time as I explained her that the medication can sometimes cause a topical skin reaction as use for prolonged periods Vaseline for moisturizing can be a good alternative  Raspy voice.-suspect esophageal candidiasis. Her voice has been persistently weak and raspy for 3 weeks after a viral illness. She's also been coughing up white chunky sputum. She does use inhalers regularly and so she does rinse properly but I am suspicious of esophageal candidiasis. We'll treat with 3 days of Diflucan. If not noticing significant improvement then please let me know and we may do to get her back in with her ENT doctor, Dr.  Dimas Millin.

## 2013-09-11 NOTE — Patient Instructions (Signed)
After you start metformin, decrease the Lantus to 10 units daily for a few days. If you're tolerating the metformin well then you can stop the Lantus for now. We will recheck her A1c in 3 months and see how well you're doing. Continue work on diet and exercise and weight loss.

## 2013-10-10 ENCOUNTER — Other Ambulatory Visit: Payer: Self-pay | Admitting: Family Medicine

## 2013-10-13 ENCOUNTER — Other Ambulatory Visit: Payer: Self-pay | Admitting: *Deleted

## 2013-10-13 DIAGNOSIS — C50919 Malignant neoplasm of unspecified site of unspecified female breast: Secondary | ICD-10-CM

## 2013-10-13 MED ORDER — TAMOXIFEN CITRATE 20 MG PO TABS: 20.0000 mg | ORAL_TABLET | Freq: Every day | ORAL | Status: DC

## 2013-10-14 ENCOUNTER — Telehealth: Payer: Self-pay | Admitting: Family Medicine

## 2013-10-14 NOTE — Telephone Encounter (Signed)
Call pt: There is an interaction between cymbalta and tamoxifen. It can reduce the efficacy of the tamoxifen.  If she would like to consider changing meds then we can consider it.  Just let me know.

## 2013-10-16 NOTE — Telephone Encounter (Signed)
Pt called and given recommendations she reports that she had problems with zoloft and another med she cannot remember. She stated that she has been on cymbalta for years and this has worked well. She is willing to try something else.Tanya Hogan

## 2013-10-18 ENCOUNTER — Telehealth: Payer: Self-pay | Admitting: Oncology

## 2013-10-18 NOTE — Telephone Encounter (Signed)
, °

## 2013-10-30 ENCOUNTER — Ambulatory Visit: Payer: Medicare Other | Admitting: Oncology

## 2013-10-30 ENCOUNTER — Encounter: Payer: Self-pay | Admitting: Hematology and Oncology

## 2013-10-30 ENCOUNTER — Encounter (INDEPENDENT_AMBULATORY_CARE_PROVIDER_SITE_OTHER): Payer: Self-pay

## 2013-10-30 ENCOUNTER — Ambulatory Visit (HOSPITAL_BASED_OUTPATIENT_CLINIC_OR_DEPARTMENT_OTHER): Payer: Medicare Other | Admitting: Hematology and Oncology

## 2013-10-30 ENCOUNTER — Other Ambulatory Visit (HOSPITAL_BASED_OUTPATIENT_CLINIC_OR_DEPARTMENT_OTHER): Payer: Medicare Other

## 2013-10-30 ENCOUNTER — Telehealth: Payer: Self-pay | Admitting: Oncology

## 2013-10-30 VITALS — BP 130/74 | HR 77 | Temp 98.4°F | Resp 20 | Ht 61.75 in | Wt 210.4 lb

## 2013-10-30 DIAGNOSIS — C50919 Malignant neoplasm of unspecified site of unspecified female breast: Secondary | ICD-10-CM

## 2013-10-30 DIAGNOSIS — Z170 Estrogen receptor positive status [ER+]: Secondary | ICD-10-CM

## 2013-10-30 DIAGNOSIS — R7309 Other abnormal glucose: Secondary | ICD-10-CM

## 2013-10-30 DIAGNOSIS — M810 Age-related osteoporosis without current pathological fracture: Secondary | ICD-10-CM

## 2013-10-30 LAB — CBC WITH DIFFERENTIAL/PLATELET
BASO%: 0.4 % (ref 0.0–2.0)
Basophils Absolute: 0 10*3/uL (ref 0.0–0.1)
EOS%: 0.7 % (ref 0.0–7.0)
Eosinophils Absolute: 0.1 10*3/uL (ref 0.0–0.5)
HCT: 39.7 % (ref 34.8–46.6)
HGB: 12.7 g/dL (ref 11.6–15.9)
LYMPH#: 2.9 10*3/uL (ref 0.9–3.3)
LYMPH%: 36.5 % (ref 14.0–49.7)
MCH: 27.5 pg (ref 25.1–34.0)
MCHC: 31.9 g/dL (ref 31.5–36.0)
MCV: 86 fL (ref 79.5–101.0)
MONO#: 0.6 10*3/uL (ref 0.1–0.9)
MONO%: 7.8 % (ref 0.0–14.0)
NEUT#: 4.3 10*3/uL (ref 1.5–6.5)
NEUT%: 54.6 % (ref 38.4–76.8)
PLATELETS: 321 10*3/uL (ref 145–400)
RBC: 4.61 10*6/uL (ref 3.70–5.45)
RDW: 15.4 % — ABNORMAL HIGH (ref 11.2–14.5)
WBC: 7.9 10*3/uL (ref 3.9–10.3)

## 2013-10-30 LAB — COMPREHENSIVE METABOLIC PANEL (CC13)
ALBUMIN: 3.7 g/dL (ref 3.5–5.0)
ALK PHOS: 87 U/L (ref 40–150)
ALT: 28 U/L (ref 0–55)
AST: 29 U/L (ref 5–34)
Anion Gap: 11 mEq/L (ref 3–11)
BILIRUBIN TOTAL: 0.21 mg/dL (ref 0.20–1.20)
BUN: 14.7 mg/dL (ref 7.0–26.0)
CO2: 25 mEq/L (ref 22–29)
Calcium: 10.2 mg/dL (ref 8.4–10.4)
Chloride: 106 mEq/L (ref 98–109)
Creatinine: 0.8 mg/dL (ref 0.6–1.1)
Glucose: 99 mg/dl (ref 70–140)
POTASSIUM: 4.7 meq/L (ref 3.5–5.1)
Sodium: 141 mEq/L (ref 136–145)
Total Protein: 7.5 g/dL (ref 6.4–8.3)

## 2013-10-30 NOTE — Telephone Encounter (Signed)
, °

## 2013-10-30 NOTE — Progress Notes (Signed)
[name]  OFFICE PROGRESS NOTE  CC  [name],[name], MD [address] [place] [address] Dr. Fanny Skates Dr. Gery Pray Dr. Loyal Gambler  DIAGNOSIS: 63 year old female with stage I invasive ductal carcinoma measuring 0.5 cm ER positive PR positive HER-2/neu negative diagnosed July 2011.  PRIOR THERAPY:  #1 patient initially underwent a lumpectomy of the left breast 04/22/2010. The final pathology revealed a 0.5 cm invasive ductal carcinoma that was estrogen receptor positive progesterone receptor positive HER-2/neu negative.  #2 she then went on to have partial breast irradiation.  #3 in November 2011 patient began tamoxifen 20 mg daily. Total of 5 years of therapy is planned.  CURRENT THERAPY:Tamoxifen 20 mg daily  INTERVAL HISTORY: Tanya Hogan 63 y.o. female returns for followup visit today. She is being seen every 6 months.Clinically she seems to be doing fairly well she is however experiencing hot flashes with the tamoxifen. She has about 2-3 episodes a day Otherwise she denies having any aches pains. She does have some blurring of vision she is seeing an ophthalmologist once a year .She reports early macular degenaration and ?dry eyes and she uses Restasis. She denies having vaginal discharge S/P hysterectomy.IShe has no nausea no vomiting no fevers chills.Denies cough or dyspnea. She is on treatment for sleep apnea.She denies recent problems with  CHF. Blood sugar elevated currently on medical management with Metformin. She had toe fracture due to minor injury and had as well stress feet fractures.She reports history of fibromyalgia. She denies problems or history of clots.She cannot lose weight although tries. No significant teeth problems,upper partial dentures.   Remainder of the 10 point review of systems is unremarkable.  MEDICAL HISTORY: Past Medical History  Diagnosis Date  . HTN (hypertension)   . CHF (congestive heart failure)   . LBBB  (left bundle branch block)   . Fibromyalgia   . Alkaline phosphatase elevation   . Hypothyroidism   . Closed fracture of unspecified part of radius (alone)   . Depression   . DDD (degenerative disc disease), lumbar   . Carpal tunnel syndrome   . IBS (irritable bowel syndrome)   . Breast cancer     ALLERGIES:  is allergic to accupril; baking soda-fluoride; clindamycin; codeine; losartan potassium; nasonex; penicillins; quinine; rifampin; sulfonamide derivatives; and miralax.  MEDICATIONS:  Current Outpatient Prescriptions  Medication Sig Dispense Refill  . ACCU-CHEK SMARTVIEW test strip       . acetaminophen (ARTHRITIS PAIN RELIEF) 650 MG CR tablet Take 650 mg by mouth every 8 (eight) hours as needed.        [name] albuterol (PROAIR HFA) 108 (90 BASE) MCG/ACT inhaler Inhale 2 puffs into the lungs every 6 (six) hours as needed.  18 g  2  . AMBULATORY NON FORMULARY MEDICATION Medication Name: Lantus Solostar Pen Needles TID Testing Diagnosis: Diabetes 250.0  100 each  5  . AMBULATORY NON FORMULARY MEDICATION True Result test strips.  Diagnosis: Diabetes Mellitus  Testing 2 times a day  100 each  4  . BD PEN NEEDLE NANO U/F 32G X 4 MM MISC Test 3 times daily with  Lantus Solostar  270 each  PRN  . bumetanide (BUMEX) 1 MG tablet Take 1 tablet (1 mg total) by mouth 2 (two) times daily.  180 tablet  3  . BYSTOLIC 5 MG tablet Take 1 tablet (5 mg total)  by mouth daily.  90 tablet  0  . cycloSPORINE (RESTASIS) 0.05 % ophthalmic emulsion 1 drop  2 (two) times daily.        . CYMBALTA 60 MG capsule Take 1 capsule by mouth  daily  90 capsule  0  . esomeprazole (NEXIUM) 40 MG capsule Take 1 capsule (40 mg total) by mouth daily before breakfast.  90 capsule  2  . fluconazole (DIFLUCAN) 150 MG tablet Take 1 tablet (150 mg total) by mouth daily.  3 tablet  0  . fluticasone (FLONASE) 50 MCG/ACT nasal spray Place 1 spray into the nose daily.  48 g  1  . Fluticasone-Salmeterol (ADVAIR DISKUS) 500-50 MCG/DOSE  AEPB Inhale 1 puff into the lungs 2 (two) times daily.  60 each  2  . gabapentin (NEURONTIN) 600 MG tablet TAKE 1 TABLET THREE TIMES DAILY  270 tablet  PRN  . levothyroxine (SYNTHROID, LEVOTHROID) 75 MCG tablet Take 1 tablet by mouth  daily  90 tablet  0  . loratadine (CLARITIN) 10 MG tablet Take 10 mg by mouth daily.      . metFORMIN (GLUCOPHAGE) 500 MG tablet Take 1 tablet (500 mg total) by mouth 2 (two) times daily with a meal.  60 tablet  2  . montelukast (SINGULAIR) 10 MG tablet Take 1 tablet by mouth daily.      . mupirocin ointment (BACTROBAN) 2 %       . pravastatin (PRAVACHOL) 40 MG tablet Take 1 tablet by mouth  daily  90 tablet  0  . tamoxifen (NOLVADEX) 20 MG tablet Take 1 tablet (20 mg total) by mouth daily.  90 tablet  0  . tiotropium (SPIRIVA HANDIHALER) 18 MCG inhalation capsule Place 1 capsule (18 mcg total) into inhaler and inhale daily.  90 capsule  3  . fexofenadine (ALLEGRA) 180 MG tablet Take 180 mg by mouth as needed.      . Insulin Glargine (LANTUS SOLOSTAR) 100 UNIT/ML Solostar Pen Inject subcutaneously 10    units at bedtime      . tiZANidine (ZANAFLEX) 4 MG capsule Take 4 mg by mouth daily.      [name] triamcinolone (KENALOG) 0.1 % paste Place onto teeth as needed.       No current facility-administered medications for this visit.    SURGICAL HISTORY:  Past Surgical History  Procedure Laterality Date  . Pacemaker placement  2009    [facility] cardiology  . Knee surgery    . Foot surgery    . Tubal ligation    . Breast lumpectomy      REVIEW OF SYSTEMS:  Pertinent items are noted in HPI.   PHYSICAL EXAMINATION: General appearance: alert, cooperative and appears stated age Lymph nodes: Cervical, supraclavicular, and axillary nodes normal. Resp: clear to auscultation bilaterally and normal percussion bilaterally Back: symmetric, no curvature. ROM normal. No CVA tenderness. Cardio: regular rate and rhythm, S1, S2 normal, no murmur, click, rub or gallop GI: soft,  non-tender; bowel sounds normal; no masses,  no organomegaly Extremities: extremities normal, atraumatic, no cyanosis or edema Neurologic: Grossly normalLeft chest wall well healed pacemaker scar Bilateral breast examination: Right breast no masses nipple discharge or skin changes no nipple inversion or a portion. Left breast reveals a well-healed incisional scar there is  Fullness across the lumpectomy site no mass.No skin changes no tenderness. No palpable masses. No nipple discharge or inversion or retraction ECOG PERFORMANCE STATUS: 1 - Symptomatic but completely ambulatory  Blood pressure 130/74, pulse 77, temperature 98.4 F (36.9 C), temperature source Oral, resp. rate 20, height 5' 1.75" (1.568 m), weight 210 lb  6.4 oz (95.437 kg).  LABORATORY DATA: Lab Results  Component Value Date   WBC 7.9 10/30/2013   HGB 12.7 10/30/2013   HCT 39.7 10/30/2013   MCV 86.0 10/30/2013   PLT 321 10/30/2013      Chemistry      Component Value Date/Time   NA 141 10/30/2013 1455   NA 139 08/08/2012 1336   K 4.7 10/30/2013 1455   K 5.1 08/08/2012 1336   CL 106 09/23/2012 0947   CL 105 08/08/2012 1336   CO2 25 10/30/2013 1455   CO2 27 08/08/2012 1336   BUN 14.7 10/30/2013 1455   BUN 14 08/08/2012 1336   CREATININE 0.8 10/30/2013 1455   CREATININE 0.78 08/08/2012 1336   CREATININE 0.74 03/17/2012 1004      Component Value Date/Time   CALCIUM 10.2 10/30/2013 1455   CALCIUM 9.7 08/08/2012 1336   ALKPHOS 87 10/30/2013 1455   ALKPHOS 68 08/08/2012 1336   AST 29 10/30/2013 1455   AST 20 08/08/2012 1336   ALT 28 10/30/2013 1455   ALT 15 08/08/2012 1336   BILITOT 0.21 10/30/2013 1455   BILITOT 0.3 08/08/2012 1336       RADIOGRAPHIC STUDIES: Study Result    CLINICAL DATA: History of left lumpectomy in 2011. The patient  takes tamoxifen.  EXAM:  DIGITAL DIAGNOSTIC BILATERAL MAMMOGRAM with CAD  COMPARISON: 04/04/2012  ACR Breast Density Category b: There are scattered areas of  fibroglandular density.  FINDINGS:   Postoperative changes are seen in the lateral portion of the left  breast. No suspicious mass, distortion, or microcalcifications  identified in either breast.  Mammographic images were processed with CAD.  IMPRESSION:  1. No mammographic evidence for malignancy.  2. Expected post lumpectomy changes on the left.  RECOMMENDATION:  Diagnostic mammogram is suggested in 1 year.     No results found.RADIOLOGY REPORT*  Clinical Data: Post menopausal osteoporosis screening. The patient  is on Tamoxifen. The patient is also on gabopentin.  DUAL X-RAY ABSORPTIOMETRY (DXA) FOR BONE MINERAL DENSITY  AP LUMBAR SPINE L1-L4  Bone Mineral Density (BMD): 0.809 g/cm2  Young Adult T Score: -2.2  Z Score: -0.5  LEFT FEMUR THE NECK  Bone Mineral Density (BMD): 0.579 g/cm2  Young Adult T Score: -2.4  Z Score: -1.0  ASSESSMENT: Patient's diagnostic category is LOW BONE MASS by WHO  Criteria.  FRACTURE RISK: INCREASED  FRAX: Based on the McKinnon model, the 10  year probability of a major osteoporotic fracture is 31%. The 10  year probability of a hip fracture is 3.1%.  Comparison: No significant change in bone density of the lumbar  spine compared to 10/24/2009. 7.7% decrease in bone density over  the total left hip compared to 2011.  RECOMMENDATIONS:  All patients should ensure an adequate intake of dietary calcium  (1264m daily) and vitamin D (800 IU daily) unless contraindicated.  The National Osteoporosis Foundation recommends that FDA-approved  medical therapies be considered in postmenopausal women and mean  age 3870or older with a:  1) Hip or vertebral (clinical or morphometric) fracture.  2) T-score of -2.5 or lower at the spine or hip.  3) Ten-year fracture probability by FRAX of 3% or greater for hip  fracture or 20% or greater for major osteoporotic fracture.  FOLLOW-UP:   ASSESSMENT: 63year old female with  #1 stage I invasive ductal carcinoma measuring  0.5 cm ER positive PR positive HER-2/neu negative. Patient is status post lumpectomy 04/22/2010 followed by partial  breast radiation. She was then begun on adjuvant tamoxifen 20 mg daily overall she seems to be doing well her hot flashes are stable.     PLAN:  #1Doing well continue tamoxifen daily till end 2016 if clinically stable.  #2 Patient agrees for treatment of thinning bones.Will schedule for Zometa 4 mg twice per year .Continue on  Calcium 600 mg 2 daily.Add Vit D3 2000 IU 1 tabl daily.  #3 we will continue to do bone density scan her every 2 years  #4 I will see you back in 6 months.She will then need mammogram scheduled due in 05/2014.  Will check CBC,CMP,Vit D in 6 months.   All questions were answered. The patient knows to call the clinic with any problems, questions or concerns. We can certainly see the patient much sooner if necessary.  I spent 15 minutes counseling the patient face to face. The total time spent in the appointment was 30 minutes.  Amada Kingfisher, M.D. Oncology/Hematology [place] [phone] (Office)     10/30/2013, 4:41 PM

## 2013-10-31 ENCOUNTER — Telehealth: Payer: Self-pay | Admitting: *Deleted

## 2013-10-31 ENCOUNTER — Telehealth: Payer: Self-pay | Admitting: Oncology

## 2013-10-31 NOTE — Telephone Encounter (Signed)
Per staff message and POF I have scheduled appts.  JMW  

## 2013-10-31 NOTE — Telephone Encounter (Signed)
, °

## 2013-11-03 ENCOUNTER — Ambulatory Visit (HOSPITAL_BASED_OUTPATIENT_CLINIC_OR_DEPARTMENT_OTHER): Payer: Medicare Other

## 2013-11-03 ENCOUNTER — Other Ambulatory Visit: Payer: Self-pay | Admitting: Oncology

## 2013-11-03 VITALS — BP 142/65 | HR 89 | Temp 98.6°F | Resp 18

## 2013-11-03 DIAGNOSIS — M949 Disorder of cartilage, unspecified: Principal | ICD-10-CM

## 2013-11-03 DIAGNOSIS — M899 Disorder of bone, unspecified: Secondary | ICD-10-CM

## 2013-11-03 MED ORDER — ZOLEDRONIC ACID 4 MG/100ML IV SOLN
4.0000 mg | Freq: Once | INTRAVENOUS | Status: AC
  Administered 2013-11-03: 4 mg via INTRAVENOUS
  Filled 2013-11-03: qty 100

## 2013-11-03 MED ORDER — SODIUM CHLORIDE 0.9 % IV SOLN
Freq: Once | INTRAVENOUS | Status: AC
  Administered 2013-11-03: 11:00:00 via INTRAVENOUS

## 2013-11-03 NOTE — Patient Instructions (Signed)
[facility] Discharge Instructions for Patients Receiving Chemotherapy  Today you received the following chemotherapy agents Zometa.   To help prevent nausea and vomiting after your treatment, we encourage you to take your nausea medication as prescribed.    If you develop nausea and vomiting that is not controlled by your nausea medication, call the clinic.   BELOW ARE SYMPTOMS THAT SHOULD BE REPORTED IMMEDIATELY:  *FEVER GREATER THAN 100.5 F  *CHILLS WITH OR WITHOUT FEVER  NAUSEA AND VOMITING THAT IS NOT CONTROLLED WITH YOUR NAUSEA MEDICATION  *UNUSUAL SHORTNESS OF BREATH  *UNUSUAL BRUISING OR BLEEDING  TENDERNESS IN MOUTH AND THROAT WITH OR WITHOUT PRESENCE OF ULCERS  *URINARY PROBLEMS  *BOWEL PROBLEMS  UNUSUAL RASH Items with * indicate a potential emergency and should be followed up as soon as possible.  Feel free to call the clinic should you have any questions or concerns. The clinic phone number is [phone].  Zoledronic Acid injection (Hypercalcemia, Oncology) What is this medicine? ZOLEDRONIC ACID (ZOE le dron ik AS id) lowers the amount of calcium loss from bone. It is used to treat too much calcium in your blood from cancer. It is also used to prevent complications of cancer that has spread to the bone. This medicine may be used for other purposes; ask your health care provider or pharmacist if you have questions. COMMON BRAND NAME(S): Zometa What should I tell my health care provider before I take this medicine? They need to know if you have any of these conditions: -aspirin-sensitive asthma -cancer, especially if you are receiving medicines used to treat cancer -dental disease or wear dentures -infection -kidney disease -receiving corticosteroids like dexamethasone or prednisone -an unusual or allergic reaction to zoledronic acid, other medicines, foods, dyes, or preservatives -pregnant or trying to get pregnant -breast-feeding How  should I use this medicine? This medicine is for infusion into a vein. It is given by a health care professional in a hospital or clinic setting. Talk to your pediatrician regarding the use of this medicine in children. Special care may be needed. Overdosage: If you think you have taken too much of this medicine contact a poison control center or emergency room at once. NOTE: This medicine is only for you. Do not share this medicine with others. What if I miss a dose? It is important not to miss your dose. Call your doctor or health care professional if you are unable to keep an appointment. What may interact with this medicine? -certain antibiotics given by injection -NSAIDs, medicines for pain and inflammation, like ibuprofen or naproxen -some diuretics like bumetanide, furosemide -teriparatide -thalidomide This list may not describe all possible interactions. Give your health care provider a list of all the medicines, herbs, non-prescription drugs, or dietary supplements you use. Also tell them if you smoke, drink alcohol, or use illegal drugs. Some items may interact with your medicine. What should I watch for while using this medicine? Visit your doctor or health care professional for regular checkups. It may be some time before you see the benefit from this medicine. Do not stop taking your medicine unless your doctor tells you to. Your doctor may order blood tests or other tests to see how you are doing. Women should inform their doctor if they wish to become pregnant or think they might be pregnant. There is a potential for serious side effects to an unborn child. Talk to your health care professional or pharmacist for more information. You should make sure that  you get enough calcium and vitamin D while you are taking this medicine. Discuss the foods you eat and the vitamins you take with your health care professional. Some people who take this medicine have severe bone, joint, and/or  muscle pain. This medicine may also increase your risk for jaw problems or a broken thigh bone. Tell your doctor right away if you have severe pain in your jaw, bones, joints, or muscles. Tell your doctor if you have any pain that does not go away or that gets worse. Tell your dentist and dental surgeon that you are taking this medicine. You should not have major dental surgery while on this medicine. See your dentist to have a dental exam and fix any dental problems before starting this medicine. Take good care of your teeth while on this medicine. Make sure you see your dentist for regular follow-up appointments. What side effects may I notice from receiving this medicine? Side effects that you should report to your doctor or health care professional as soon as possible: -allergic reactions like skin rash, itching or hives, swelling of the face, lips, or tongue -anxiety, confusion, or depression -breathing problems -changes in vision -eye pain -feeling faint or lightheaded, falls -jaw pain, especially after dental work -mouth sores -muscle cramps, stiffness, or weakness -trouble passing urine or change in the amount of urine Side effects that usually do not require medical attention (report to your doctor or health care professional if they continue or are bothersome): -bone, joint, or muscle pain -constipation -diarrhea -fever -hair loss -irritation at site where injected -loss of appetite -nausea, vomiting -stomach upset -trouble sleeping -trouble swallowing -weak or tired This list may not describe all possible side effects. Call your doctor for medical advice about side effects. You may report side effects to FDA at 1-800-FDA-1088. Where should I keep my medicine? This drug is given in a hospital or clinic and will not be stored at home. NOTE: This sheet is a summary. It may not cover all possible information. If you have questions about this medicine, talk to your doctor,  pharmacist, or health care provider.  2014, Elsevier/Gold Standard. (2013-01-19 13:03:13)

## 2013-11-08 ENCOUNTER — Ambulatory Visit (INDEPENDENT_AMBULATORY_CARE_PROVIDER_SITE_OTHER): Payer: Medicare Other | Admitting: *Deleted

## 2013-11-08 ENCOUNTER — Encounter: Payer: Self-pay | Admitting: Internal Medicine

## 2013-11-08 DIAGNOSIS — I5022 Chronic systolic (congestive) heart failure: Secondary | ICD-10-CM

## 2013-11-08 DIAGNOSIS — I447 Left bundle-branch block, unspecified: Secondary | ICD-10-CM

## 2013-11-08 LAB — MDC_IDC_ENUM_SESS_TYPE_INCLINIC
Battery Remaining Longevity: 21 mo
Battery Voltage: 2.92 V
Brady Statistic AP VP Percent: 9 %
Brady Statistic AS VS Percent: 0 %
Implantable Pulse Generator Model: 8042
Lead Channel Pacing Threshold Amplitude: 1 V
Lead Channel Pacing Threshold Amplitude: 1 V
Lead Channel Pacing Threshold Amplitude: 1 V
Lead Channel Pacing Threshold Pulse Width: 0.5 ms
Lead Channel Pacing Threshold Pulse Width: 0.6 ms
Lead Channel Pacing Threshold Pulse Width: 0.8 ms
Lead Channel Setting Pacing Amplitude: 2.5 V
Lead Channel Setting Pacing Pulse Width: 0.6 ms
Lead Channel Setting Pacing Pulse Width: 0.8 ms
MDC IDC MSMT LEADCHNL LV IMPEDANCE VALUE: 1334 Ohm
MDC IDC MSMT LEADCHNL RA IMPEDANCE VALUE: 616 Ohm
MDC IDC MSMT LEADCHNL RA SENSING INTR AMPL: 2.8 mV
MDC IDC MSMT LEADCHNL RV IMPEDANCE VALUE: 687 Ohm
MDC IDC SESS DTM: 20150318120028
MDC IDC SET LEADCHNL RA PACING AMPLITUDE: 2 V
MDC IDC SET LEADCHNL RV PACING AMPLITUDE: 2.5 V
MDC IDC SET LEADCHNL RV SENSING SENSITIVITY: 2.8 mV
MDC IDC STAT BRADY AP VS PERCENT: 0 %
MDC IDC STAT BRADY AS VP PERCENT: 90 %

## 2013-11-10 NOTE — Telephone Encounter (Signed)
Pt stated that neither are familiar and she really is hesitant to change meds because what she is currently taking is working well for her.Tanya Hogan'

## 2013-11-10 NOTE — Progress Notes (Signed)
CRT-P device check in clinic. Normal device function. Thresholds, sensing, impedance consistent with previous measurements. Histograms appropriate for patient and level of activity. 33 mode switches (0%)---max dur. 11 mins, Max A 179, Max V 135---AT/AFL per markers. No ventricular high rate episodes. 59 VS episodes---max dur. 8 sec, Max V 83---idioventricular. Patient bi-ventricularly pacing >99% of the time. Device programmed with appropriate safety margins. Estimated longevity 1.5 years. Plan to check device with GT in 6 months.

## 2013-11-10 NOTE — Telephone Encounter (Signed)
We could consider Effexor or Pristiq.  Do either one of those sounds familiar?

## 2013-11-20 ENCOUNTER — Ambulatory Visit (INDEPENDENT_AMBULATORY_CARE_PROVIDER_SITE_OTHER): Payer: Medicare Other | Admitting: Family Medicine

## 2013-11-20 ENCOUNTER — Encounter: Payer: Self-pay | Admitting: Family Medicine

## 2013-11-20 VITALS — BP 152/75 | HR 68 | Wt 211.0 lb

## 2013-11-20 DIAGNOSIS — E669 Obesity, unspecified: Secondary | ICD-10-CM

## 2013-11-20 DIAGNOSIS — Z79899 Other long term (current) drug therapy: Secondary | ICD-10-CM

## 2013-11-20 DIAGNOSIS — IMO0001 Reserved for inherently not codable concepts without codable children: Secondary | ICD-10-CM

## 2013-11-20 MED ORDER — NEBIVOLOL HCL 5 MG PO TABS: ORAL_TABLET | ORAL | Status: DC

## 2013-11-20 MED ORDER — MONTELUKAST SODIUM 10 MG PO TABS: 10.0000 mg | ORAL_TABLET | Freq: Every day | ORAL | Status: DC

## 2013-11-20 MED ORDER — PRAVASTATIN SODIUM 40 MG PO TABS: ORAL_TABLET | ORAL | Status: DC

## 2013-11-20 MED ORDER — LEVOTHYROXINE SODIUM 75 MCG PO TABS: ORAL_TABLET | ORAL | Status: DC

## 2013-11-20 MED ORDER — ALBUTEROL SULFATE HFA 108 (90 BASE) MCG/ACT IN AERS: 2.0000 | INHALATION_SPRAY | Freq: Four times a day (QID) | RESPIRATORY_TRACT | Status: DC | PRN

## 2013-11-20 NOTE — Progress Notes (Signed)
   Subjective:    Patient ID: Tanya Hogan, female    DOB: Feb 25, 1951, 63 y.o.   MRN: [id number]  HPI  She is here today for medication review. She has several questions about her medications. On zometa IV 6 months. She received a paper afterwards saying the shouldn't be on Bumex while on it.  Seh was nervous about this so started cutting her tab in half.  She was on calcium but stopped it after heard on the news that it can increase risk of bone fractures. Had some shaking and dark urine after the infusion.  She's feeling better now overall. Next infusion is scheduled for September.  We also discussed how Cymbalta can interact with her tamoxifen and make a tamoxifen less affected. She tried a couple different agents in the past and is on seems to work the best for her. She's very hesitant to come off of it. Review of Systems     Objective:   Physical Exam  Constitutional: She is oriented to person, place, and time. She appears well-developed and well-nourished.  HENT:  Head: Normocephalic and atraumatic.  Cardiovascular: Normal rate, regular rhythm and normal heart sounds.   Pulmonary/Chest: Effort normal and breath sounds normal.  Neurological: She is alert and oriented to person, place, and time.  Skin: Skin is warm and dry.  Psychiatric: She has a normal mood and affect. Her behavior is normal.          Assessment & Plan:  Medication management-Recommend calcium with vitamin D since doing Zometa and since on Bumex. Did warn about the potential of hypocalcemia with the combo. We can switch to a different diuretic but at this point I think we can safely continue the Bumex as long as her calcium is monitored carefully. Explained her that typically a calcium level is checked before each injection within at least a 30 day window. This should help give her some reassurance. In addition if she gets back on her calcium and vitamin D,  this should help counteract any hypocalcemia that might  occur with a diuretic.  We also discussed that I just want her to be aware of the potential interaction between the Cymbalta and the tamoxifen. Certainly if her oncologist is aware and they're okay with taking a combination and that is perfectly fine. I just want her to understand that there is a potential risk fair. She's happy with the Cymbalta and would like to continue it for now.  Keep regular appointment for diabetic followup.  Time spent 25 minutes, greater than 50% spent counseling about medications.

## 2013-11-28 DIAGNOSIS — G4700 Insomnia, unspecified: Secondary | ICD-10-CM | POA: Insufficient documentation

## 2013-12-11 ENCOUNTER — Other Ambulatory Visit: Payer: Self-pay | Admitting: Family Medicine

## 2013-12-11 ENCOUNTER — Ambulatory Visit: Payer: Medicare Other | Admitting: Family Medicine

## 2013-12-14 ENCOUNTER — Ambulatory Visit (INDEPENDENT_AMBULATORY_CARE_PROVIDER_SITE_OTHER): Payer: Medicare Other | Admitting: Family Medicine

## 2013-12-14 ENCOUNTER — Encounter: Payer: Self-pay | Admitting: Family Medicine

## 2013-12-14 VITALS — BP 131/77 | HR 71

## 2013-12-14 DIAGNOSIS — S7292XA Unspecified fracture of left femur, initial encounter for closed fracture: Secondary | ICD-10-CM

## 2013-12-14 DIAGNOSIS — S7290XA Unspecified fracture of unspecified femur, initial encounter for closed fracture: Secondary | ICD-10-CM

## 2013-12-14 DIAGNOSIS — I10 Essential (primary) hypertension: Secondary | ICD-10-CM

## 2013-12-14 DIAGNOSIS — E039 Hypothyroidism, unspecified: Secondary | ICD-10-CM

## 2013-12-14 DIAGNOSIS — E119 Type 2 diabetes mellitus without complications: Secondary | ICD-10-CM

## 2013-12-14 LAB — POCT GLYCOSYLATED HEMOGLOBIN (HGB A1C): Hemoglobin A1C: 6.1

## 2013-12-14 MED ORDER — TRAZODONE HCL 50 MG PO TABS: 25.0000 mg | ORAL_TABLET | Freq: Every evening | ORAL | Status: DC | PRN

## 2013-12-14 MED ORDER — METFORMIN HCL 500 MG PO TABS: ORAL_TABLET | ORAL | Status: DC

## 2013-12-14 NOTE — Progress Notes (Signed)
Subjective:    Patient ID: Tanya Hogan, female    DOB: [birth year], 63 y.o.   MRN: [id number]  HPI  Diabetes - no hypoglycemic events. No wounds or sores that are not healing well. No increased thirst or urination. Checking glucose at home. Taking medications as prescribed without any side effects. On metformin.  Lab Results  Component Value Date   HGBA1C 6.4 09/11/2013    Hypertension- Pt denies chest pain, SOB, dizziness, or heart palpitations.  Taking meds as directed w/o problems.  Denies medication side effects.    She fractured the "long" bone in her leg and has been seeing ortho.  She has been very inactive since then. She is on percocet for pain.  She is also taking Milk of magnesium to he.; move her bowels.  Hypothyroidism-no recent skin or hair changes or weight changes. Review of Systems BP 131/77  Pulse 71    Allergies  Allergen Reactions  . Accupril [Quinapril Hcl] Other (See Comments)    Hyperkalemia  . Baking Soda-Fluoride [Sodium Fluoride] Itching  . Clindamycin   . Codeine   . Losartan Potassium Itching  . Nasonex [Mometasone Furoate] Other (See Comments)    nosebleed  . Penicillins   . Quinine   . Rifampin   . Sulfonamide Derivatives   . Miralax [Polyethylene Glycol] Rash    Past Medical History  Diagnosis Date  . HTN (hypertension)   . CHF (congestive heart failure)   . LBBB (left bundle branch block)   . Fibromyalgia   . Alkaline phosphatase elevation   . Hypothyroidism   . Closed fracture of unspecified part of radius (alone)   . Depression   . DDD (degenerative disc disease), lumbar   . Carpal tunnel syndrome   . IBS (irritable bowel syndrome)   . Breast cancer     Past Surgical History  Procedure Laterality Date  . Pacemaker placement  2009    [facility] cardiology  . Knee surgery    . Foot surgery    . Tubal ligation    . Breast lumpectomy      History   Social History  . Marital Status: Divorced    Spouse Name: N/A   Number of Children: N/A  . Years of Education: N/A   Occupational History  . Not on file.   Social History Main Topics  . Smoking status: Former Smoker    Quit date: 08/24/1966  . Smokeless tobacco: Not on file  . Alcohol Use: No  . Drug Use: No  . Sexual Activity: Not Currently   Other Topics Concern  . Not on file   Social History Narrative  . No narrative on file    Family History  Problem Relation Age of Onset  . Heart failure    . Breast cancer    . Hypertension    . Melanoma Mother     Outpatient Encounter Prescriptions as of 12/14/2013  Medication Sig  . ACCU-CHEK SMARTVIEW test strip   . acetaminophen (ARTHRITIS PAIN RELIEF) 650 MG CR tablet Take 650 mg by mouth every 8 (eight) hours as needed.    [name] albuterol (PROAIR HFA) 108 (90 BASE) MCG/ACT inhaler Inhale 2 puffs into the lungs every 6 (six) hours as needed.  . AMBULATORY NON FORMULARY MEDICATION Medication Name: Lantus Solostar Pen Needles TID Testing Diagnosis: Diabetes 250.0  . AMBULATORY NON FORMULARY MEDICATION True Result test strips.  Diagnosis: Diabetes Mellitus  Testing 2 times a day  . BD PEN NEEDLE  NANO U/F 32G X 4 MM MISC Test 3 times daily with  Lantus Solostar  . bumetanide (BUMEX) 1 MG tablet Take 1 tablet (1 mg total) by mouth 2 (two) times daily.  . cycloSPORINE (RESTASIS) 0.05 % ophthalmic emulsion 1 drop 2 (two) times daily.    . CYMBALTA 60 MG capsule Take 1 capsule by mouth  daily  . fluticasone (FLONASE) 50 MCG/ACT nasal spray Place 1 spray into the nose daily.  . Fluticasone-Salmeterol (ADVAIR DISKUS) 500-50 MCG/DOSE AEPB Inhale 1 puff into the lungs 2 (two) times daily.  [name] gabapentin (NEURONTIN) 600 MG tablet TAKE 1 TABLET THREE TIMES DAILY  . levothyroxine (SYNTHROID, LEVOTHROID) 75 MCG tablet Take 1 tablet by mouth  daily  . loratadine (CLARITIN) 10 MG tablet Take 10 mg by mouth daily.  . metFORMIN (GLUCOPHAGE) 500 MG tablet TAKE ONE TABLET BY MOUTH TWICE DAILY WITH MEALS  .  montelukast (SINGULAIR) 10 MG tablet Take 1 tablet (10 mg total) by mouth daily.  . mupirocin ointment (BACTROBAN) 2 %   . nebivolol (BYSTOLIC) 5 MG tablet Take 1 tablet (5 mg total)  by mouth daily.  [name] NEXIUM 40 MG capsule   . oxyCODONE-acetaminophen (PERCOCET) 10-325 MG per tablet Take 1 tablet by mouth every 4 (four) hours as needed for pain.  . pravastatin (PRAVACHOL) 40 MG tablet Take 1 tablet by mouth  daily  . tamoxifen (NOLVADEX) 20 MG tablet Take 1 tablet (20 mg total) by mouth daily.  [name] tiotropium (SPIRIVA HANDIHALER) 18 MCG inhalation capsule Place 1 capsule (18 mcg total) into inhaler and inhale daily.  [name] tiZANidine (ZANAFLEX) 4 MG capsule Take 4 mg by mouth daily.  . traZODone (DESYREL) 50 MG tablet Take 0.5-1 tablets (25-50 mg total) by mouth at bedtime as needed for sleep.  [name] triamcinolone (KENALOG) 0.1 % paste Place onto teeth as needed.  . [DISCONTINUED] metFORMIN (GLUCOPHAGE) 500 MG tablet TAKE ONE TABLET BY MOUTH TWICE DAILY WITH MEALS  . [DISCONTINUED] traZODone (DESYREL) 50 MG tablet   . [DISCONTINUED] fluconazole (DIFLUCAN) 150 MG tablet Take 1 tablet (150 mg total) by mouth daily.          Objective:   Physical Exam  Constitutional: She is oriented to person, place, and time. She appears well-developed and well-nourished.  HENT:  Head: Normocephalic and atraumatic.  Cardiovascular: Normal rate, regular rhythm and normal heart sounds.   Pulmonary/Chest: Effort normal and breath sounds normal.  Neurological: She is alert and oriented to person, place, and time.  Skin: Skin is warm and dry.  Psychiatric: She has a normal mood and affect. Her behavior is normal.          Assessment & Plan:  DM- well controlled. Continue current regimen. Followup in 3-4 months. Ac is 6.1 it looks fantastic. Great job. Quickly after the bone heals she'll be released in approximately 6-8 weeks we'll be able to become active again.  HTN - well controlled today.  Continue current  regimen. Followup in 3-4 weeks.  Hypothyroidism-due to recheck TSH. Lab slip given she will go down for it today.  Femur fracture-currently being followed by orthopedics. She is in a brace in a wheelchair today. Hopefully when she is really she'll be able to become more active.

## 2013-12-19 LAB — TSH: TSH: 1.763 u[IU]/mL (ref 0.350–4.500)

## 2013-12-19 NOTE — Progress Notes (Signed)
Quick Note:  All labs are normal. ______ 

## 2014-02-15 ENCOUNTER — Other Ambulatory Visit: Payer: Self-pay | Admitting: Family Medicine

## 2014-02-21 ENCOUNTER — Encounter: Payer: Self-pay | Admitting: Physician Assistant

## 2014-02-21 ENCOUNTER — Ambulatory Visit (INDEPENDENT_AMBULATORY_CARE_PROVIDER_SITE_OTHER): Payer: Medicare Other | Admitting: Physician Assistant

## 2014-02-21 VITALS — BP 113/71 | HR 83 | Ht 61.0 in | Wt 210.0 lb

## 2014-02-21 DIAGNOSIS — J441 Chronic obstructive pulmonary disease with (acute) exacerbation: Secondary | ICD-10-CM

## 2014-02-21 DIAGNOSIS — J069 Acute upper respiratory infection, unspecified: Secondary | ICD-10-CM

## 2014-02-21 MED ORDER — BENZONATATE 200 MG PO CAPS: 200.0000 mg | ORAL_CAPSULE | Freq: Two times a day (BID) | ORAL | Status: DC | PRN

## 2014-02-21 MED ORDER — METHYLPREDNISOLONE SODIUM SUCC 125 MG IJ SOLR
125.0000 mg | Freq: Once | INTRAMUSCULAR | Status: AC
  Administered 2014-02-21: 125 mg via INTRAMUSCULAR

## 2014-02-21 MED ORDER — DOXYCYCLINE HYCLATE 100 MG PO TABS: 100.0000 mg | ORAL_TABLET | Freq: Two times a day (BID) | ORAL | Status: DC

## 2014-02-21 NOTE — Patient Instructions (Addendum)
Albuterol every 2-4 hours.  Doxycycline for 10 days.  Tessalon pearle as needed for cough.

## 2014-02-24 NOTE — Progress Notes (Signed)
   Subjective:    Patient ID: Tanya Hogan, female    DOB: 01/02/1951, 63 y.o.   MRN: [id number]  HPI Pt presents to the clinic with cough and SOB. She has COPD and on maintence inhalers. She has cold like symptoms about 2 weeks ago and they have continued to progress. She is getting tighter and tighter in the chest. Sputum production is green. No fever or chills. No ST, ear pain or sinus pressure. Tried mucinex OTC. Using albuterol inhaler at least 3 times a day with minimal relief.    Review of Systems  All other systems reviewed and are negative.      Objective:   Physical Exam  Constitutional: She is oriented to person, place, and time. She appears well-developed and well-nourished.  HENT:  Head: Normocephalic and atraumatic.  Right Ear: External ear normal.  Left Ear: External ear normal.  Nose: Nose normal.  Mouth/Throat: Oropharynx is clear and moist. No oropharyngeal exudate.  TM's clear.   Eyes: Conjunctivae are normal. Right eye exhibits no discharge. Left eye exhibits no discharge.  Neck: Normal range of motion. Neck supple.  Cardiovascular: Normal rate, regular rhythm and normal heart sounds.   Pulmonary/Chest: Effort normal and breath sounds normal.  Coarse breath sounds, wheezing bilaterally. Productive cough on exam.   Lymphadenopathy:    She has no cervical adenopathy.  Neurological: She is alert and oriented to person, place, and time.  Skin: Skin is dry.  Psychiatric: She has a normal mood and affect. Her behavior is normal.          Assessment & Plan:  URI/COPD exacerbation- pt has been using albuterol regularly. Did not do nebulizer in office today. Solumedrol 12m given IM. Pt has a really hard problem controlling sugars after prednisone and wants the lowest dose possible. Doxycycline to take home. Allergies to PCN. Tessalone pearles for cough. Encouraged usage regualrly of albuterol inhaler. Follow up as needed.

## 2014-03-30 ENCOUNTER — Other Ambulatory Visit: Payer: Self-pay | Admitting: Family Medicine

## 2014-03-30 ENCOUNTER — Other Ambulatory Visit: Payer: Self-pay | Admitting: Oncology

## 2014-03-30 DIAGNOSIS — C50919 Malignant neoplasm of unspecified site of unspecified female breast: Secondary | ICD-10-CM

## 2014-04-13 ENCOUNTER — Encounter: Payer: Self-pay | Admitting: Family Medicine

## 2014-04-13 ENCOUNTER — Ambulatory Visit (INDEPENDENT_AMBULATORY_CARE_PROVIDER_SITE_OTHER): Payer: Medicare Other | Admitting: Family Medicine

## 2014-04-13 VITALS — BP 150/86 | HR 109 | Wt 205.0 lb

## 2014-04-13 DIAGNOSIS — W57XXXA Bitten or stung by nonvenomous insect and other nonvenomous arthropods, initial encounter: Secondary | ICD-10-CM

## 2014-04-13 DIAGNOSIS — J340 Abscess, furuncle and carbuncle of nose: Secondary | ICD-10-CM

## 2014-04-13 DIAGNOSIS — I10 Essential (primary) hypertension: Secondary | ICD-10-CM

## 2014-04-13 DIAGNOSIS — J3489 Other specified disorders of nose and nasal sinuses: Secondary | ICD-10-CM

## 2014-04-13 DIAGNOSIS — J4489 Other specified chronic obstructive pulmonary disease: Secondary | ICD-10-CM

## 2014-04-13 DIAGNOSIS — E119 Type 2 diabetes mellitus without complications: Secondary | ICD-10-CM

## 2014-04-13 DIAGNOSIS — J449 Chronic obstructive pulmonary disease, unspecified: Secondary | ICD-10-CM

## 2014-04-13 LAB — POCT UA - MICROALBUMIN
CREATININE, POC: 100 mg/dL
Microalbumin Ur, POC: 30 mg/L

## 2014-04-13 LAB — POCT GLYCOSYLATED HEMOGLOBIN (HGB A1C): Hemoglobin A1C: 6.4

## 2014-04-13 MED ORDER — AMBULATORY NON FORMULARY MEDICATION
Status: DC
Start: 2014-04-13 — End: 2018-09-09

## 2014-04-13 NOTE — Assessment & Plan Note (Addendum)
Will place an order for new supplies for her nebulizer machine with [facility] who she is already using for her CPAP device. Continue Spiriva. She also is on her allergy medications and follows with the allergist.

## 2014-04-13 NOTE — Progress Notes (Signed)
   Subjective:    Patient ID: Tanya Hogan, female    DOB: February 18, 1951, 63 y.o.   MRN: [id number]  HPI Diabetes - no hypoglycemic events. No wounds or sores that are not healing well. No increased thirst or urination. Checking glucose at home. Taking medications as prescribed without any side effects.   Hypertension- Pt denies chest pain, SOB, dizziness, or heart palpitations.  Taking meds as directed w/o problems.  Denies medication side effects.    COPD - She needs new supplies for her nebulizer.  Says her machine is > 48 yr old but is working well.  Doesn't have a home health company. She wears her CPAP and says it has been very drying but appt with her pulm in a few weeks.    Tick bite was a month ago.  She says it bruised and it has felt warm and says think having some low grade fevers at night.     Would like DMV form complete.    Feels like getting nasal ulcer. Has been using flonase and now feels like there is hole in her nose.  She has appt iwht ENT but not until October.     Review of Systems     Objective:   Physical Exam  Constitutional: She is oriented to person, place, and time. She appears well-developed and well-nourished.  HENT:  Head: Normocephalic and atraumatic.  Right Ear: External ear normal.  Left Ear: External ear normal.  Nose: Nose normal.  Mouth/Throat: Oropharynx is clear and moist.  TMs and canals are clear.   Eyes: Conjunctivae and EOM are normal. Pupils are equal, round, and reactive to light.  Neck: Neck supple. No thyromegaly present.  Cardiovascular: Normal rate, regular rhythm and normal heart sounds.   Pulmonary/Chest: Effort normal and breath sounds normal. She has no wheezes.  Musculoskeletal:  No bruising or swelling on her calf where she had a tick bite. No rash.  Lymphadenopathy:    She has no cervical adenopathy.  Neurological: She is alert and oriented to person, place, and time.  Skin: Skin is warm and dry.  Psychiatric: She has a  normal mood and affect.          Assessment & Plan:  Nasal ulcer-she has followup with ENT in October. Encouraged her to use mupirocin ointment at bedtime and make sure she is using her humidifier on her CPAP machine. Also recommend that she gently clean the nose the morning with saline then she says it tends to get completely plugged overnight. Avoid blowing nose hard and dislodging the scab. It sounds like she's been doing this every morning. Explained her that this actually takes good healthy tissue with the scab. And she needs to be careful about doing that.

## 2014-04-13 NOTE — Assessment & Plan Note (Signed)
BP not well-controlled today. We'll recheck at followup visit in 3 months. If still not well controlled we'll need to adjust her regimen.

## 2014-04-13 NOTE — Assessment & Plan Note (Addendum)
Well-controlled on current regimen. Keep up the good work. Hemoglobin A1c is 6.4 today. Followup in the 3 months since her A1c did go up a little bit compared to previous of 6.1. She is on a statin.

## 2014-04-18 ENCOUNTER — Other Ambulatory Visit: Payer: Self-pay | Admitting: Adult Health

## 2014-04-18 ENCOUNTER — Other Ambulatory Visit: Payer: Self-pay | Admitting: Family Medicine

## 2014-04-18 DIAGNOSIS — C50919 Malignant neoplasm of unspecified site of unspecified female breast: Secondary | ICD-10-CM

## 2014-04-19 ENCOUNTER — Telehealth: Payer: Self-pay | Admitting: *Deleted

## 2014-04-19 NOTE — Telephone Encounter (Signed)
Called and informed pt that handicap placard form is completed.Tanya Hogan [name]

## 2014-04-24 DIAGNOSIS — R5381 Other malaise: Secondary | ICD-10-CM | POA: Insufficient documentation

## 2014-04-27 ENCOUNTER — Telehealth: Payer: Self-pay | Admitting: Hematology and Oncology

## 2014-04-27 NOTE — Telephone Encounter (Signed)
MOVED 9/11 APPT FROM [facility] TO [facility] DUE TO PT TO HAVE ZOMETA. S/W PT SHE IS AWARE. SAME D/T.

## 2014-05-03 ENCOUNTER — Other Ambulatory Visit: Payer: Self-pay | Admitting: *Deleted

## 2014-05-03 DIAGNOSIS — M949 Disorder of cartilage, unspecified: Secondary | ICD-10-CM

## 2014-05-03 DIAGNOSIS — E039 Hypothyroidism, unspecified: Secondary | ICD-10-CM

## 2014-05-03 DIAGNOSIS — M899 Disorder of bone, unspecified: Secondary | ICD-10-CM

## 2014-05-04 ENCOUNTER — Encounter: Payer: Self-pay | Admitting: Adult Health

## 2014-05-04 ENCOUNTER — Ambulatory Visit (HOSPITAL_BASED_OUTPATIENT_CLINIC_OR_DEPARTMENT_OTHER): Payer: Medicare Other

## 2014-05-04 ENCOUNTER — Ambulatory Visit (HOSPITAL_BASED_OUTPATIENT_CLINIC_OR_DEPARTMENT_OTHER): Payer: Medicare Other | Admitting: Adult Health

## 2014-05-04 ENCOUNTER — Telehealth: Payer: Self-pay | Admitting: Adult Health

## 2014-05-04 ENCOUNTER — Other Ambulatory Visit (HOSPITAL_BASED_OUTPATIENT_CLINIC_OR_DEPARTMENT_OTHER): Payer: Medicare Other

## 2014-05-04 VITALS — BP 126/53 | HR 76 | Temp 98.4°F | Resp 20 | Ht 61.0 in | Wt 214.9 lb

## 2014-05-04 DIAGNOSIS — Z853 Personal history of malignant neoplasm of breast: Secondary | ICD-10-CM

## 2014-05-04 DIAGNOSIS — Z23 Encounter for immunization: Secondary | ICD-10-CM

## 2014-05-04 DIAGNOSIS — Z79811 Long term (current) use of aromatase inhibitors: Secondary | ICD-10-CM

## 2014-05-04 DIAGNOSIS — M899 Disorder of bone, unspecified: Secondary | ICD-10-CM

## 2014-05-04 DIAGNOSIS — M949 Disorder of cartilage, unspecified: Principal | ICD-10-CM

## 2014-05-04 DIAGNOSIS — I10 Essential (primary) hypertension: Secondary | ICD-10-CM

## 2014-05-04 DIAGNOSIS — E039 Hypothyroidism, unspecified: Secondary | ICD-10-CM

## 2014-05-04 DIAGNOSIS — C50919 Malignant neoplasm of unspecified site of unspecified female breast: Secondary | ICD-10-CM

## 2014-05-04 LAB — CBC WITH DIFFERENTIAL/PLATELET
BASO%: 0.4 % (ref 0.0–2.0)
Basophils Absolute: 0 10*3/uL (ref 0.0–0.1)
EOS%: 1.4 % (ref 0.0–7.0)
Eosinophils Absolute: 0.1 10*3/uL (ref 0.0–0.5)
HCT: 36.4 % (ref 34.8–46.6)
HGB: 11.6 g/dL (ref 11.6–15.9)
LYMPH%: 33.9 % (ref 14.0–49.7)
MCH: 28.4 pg (ref 25.1–34.0)
MCHC: 31.9 g/dL (ref 31.5–36.0)
MCV: 89 fL (ref 79.5–101.0)
MONO#: 0.5 10*3/uL (ref 0.1–0.9)
MONO%: 6.7 % (ref 0.0–14.0)
NEUT#: 4.2 10*3/uL (ref 1.5–6.5)
NEUT%: 57.6 % (ref 38.4–76.8)
PLATELETS: 276 10*3/uL (ref 145–400)
RBC: 4.09 10*6/uL (ref 3.70–5.45)
RDW: 14.5 % (ref 11.2–14.5)
WBC: 7.3 10*3/uL (ref 3.9–10.3)
lymph#: 2.5 10*3/uL (ref 0.9–3.3)

## 2014-05-04 LAB — COMPREHENSIVE METABOLIC PANEL (CC13)
ALK PHOS: 83 U/L (ref 40–150)
ALT: 26 U/L (ref 0–55)
AST: 28 U/L (ref 5–34)
Albumin: 3.2 g/dL — ABNORMAL LOW (ref 3.5–5.0)
Anion Gap: 10 mEq/L (ref 3–11)
BUN: 14.3 mg/dL (ref 7.0–26.0)
CO2: 22 mEq/L (ref 22–29)
CREATININE: 0.7 mg/dL (ref 0.6–1.1)
Calcium: 9 mg/dL (ref 8.4–10.4)
Chloride: 109 mEq/L (ref 98–109)
GLUCOSE: 168 mg/dL — AB (ref 70–140)
Potassium: 4.6 mEq/L (ref 3.5–5.1)
SODIUM: 141 meq/L (ref 136–145)
TOTAL PROTEIN: 6.6 g/dL (ref 6.4–8.3)

## 2014-05-04 MED ORDER — ZOLEDRONIC ACID 4 MG/100ML IV SOLN
4.0000 mg | Freq: Once | INTRAVENOUS | Status: AC
  Administered 2014-05-04: 4 mg via INTRAVENOUS
  Filled 2014-05-04: qty 100

## 2014-05-04 MED ORDER — INFLUENZA VAC SPLIT QUAD 0.5 ML IM SUSY
0.5000 mL | PREFILLED_SYRINGE | Freq: Once | INTRAMUSCULAR | Status: AC
  Administered 2014-05-04: 0.5 mL via INTRAMUSCULAR
  Filled 2014-05-04: qty 0.5

## 2014-05-04 NOTE — Progress Notes (Signed)
[name]  OFFICE PROGRESS NOTE  CC  [name],[name], MD [address] [place] [address] Dr. Fanny Skates Dr. Gery Pray Dr. Loyal Gambler  DIAGNOSIS: 63 year old female with stage I invasive ductal carcinoma measuring 0.5 cm ER positive PR positive HER-2/neu negative diagnosed July 2011.  PRIOR THERAPY:  #1 patient initially underwent a lumpectomy of the left breast 04/22/2010. The final pathology revealed a 0.5 cm invasive ductal carcinoma that was estrogen receptor positive progesterone receptor positive HER-2/neu negative.  #2 she then went on to have partial breast irradiation.  #3 in November 2011 patient began tamoxifen 20 mg daily. Total of 5 years of therapy is planned.  CURRENT THERAPY:Tamoxifen 20 mg daily  INTERVAL HISTORY: Tanya Hogan 63 y.o. female returns for followup visit today.  She has been taking Tamoxifen daily since November, 2011.  She says that she has had horrible side effects from the Tamoxifen and has been ignored and was told to take it.  She received Zometa 6 months ago and felt horrible afterwards.  She was knocked down and couldn't get back up.  When I asked her if she'd be willing to take a pill, she said she reacts easily to oral medications, so good luck.  She wants to know the purpose of the Zometa and what it is supposed to accomplish.  She did have a bone density Dr. Madilyn Fireman has a copy of.  She says that she has called [place] multiple times and never got a return call.  She did fall and sustain a leg fracture about 5 months ago.  She has fallen a couple of more times with no injuries.  She denies any new pain, or further concerns.     REVIEW OF SYSTEMS:   A 10 point review of systems was conducted and is otherwise negative except for what is noted above.     MEDICAL HISTORY: Past Medical History  Diagnosis Date  . HTN (hypertension)   . CHF (congestive heart failure)   . LBBB (left bundle branch block)   . Fibromyalgia    . Alkaline phosphatase elevation   . Hypothyroidism   . Closed fracture of unspecified part of radius (alone)   . Depression   . DDD (degenerative disc disease), lumbar   . Carpal tunnel syndrome   . IBS (irritable bowel syndrome)   . Breast cancer     ALLERGIES:  is allergic to accupril; baking soda-fluoride; clindamycin; codeine; fluticasone; losartan potassium; nasonex; penicillins; quinine; rifampin; sulfonamide derivatives; and miralax.  MEDICATIONS:  Current Outpatient Prescriptions  Medication Sig Dispense Refill  . ACCU-CHEK SMARTVIEW test strip Test two times daily  100 each  11  . acetaminophen (ARTHRITIS PAIN RELIEF) 650 MG CR tablet Take 650 mg by mouth every 8 (eight) hours as needed.        . AMBULATORY NON FORMULARY MEDICATION Medication Name: Lantus Solostar Pen Needles TID Testing Diagnosis: Diabetes 250.0  100 each  5  . AMBULATORY NON FORMULARY MEDICATION True Result test strips.  Diagnosis: Diabetes Mellitus  Testing 2 times a day  100 each  4  . AMBULATORY NON FORMULARY MEDICATION Medication Name: Needs tubing and suplies for her nebulizer machine. Dx COPD. [facility]  1 vial  0  . BD PEN NEEDLE NANO U/F 32G X 4 MM MISC Test 3 times daily with  Lantus Solostar  270 each  PRN  . bumetanide (BUMEX) 1 MG tablet Take 1 tablet (1 mg total) by mouth 2 (  two) times daily.  180 tablet  3  . cycloSPORINE (RESTASIS) 0.05 % ophthalmic emulsion 1 drop 2 (two) times daily.        [name] doxycycline (VIBRA-TABS) 100 MG tablet Take 1 tablet (100 mg total) by mouth 2 (two) times daily. For 10 days.  20 tablet  0  . DULoxetine (CYMBALTA) 60 MG capsule Take 1 capsule by mouth  daily  30 capsule  0  . fluticasone (FLONASE) 50 MCG/ACT nasal spray Place 1 spray into the nose daily.  48 g  1  . Fluticasone-Salmeterol (ADVAIR DISKUS) 500-50 MCG/DOSE AEPB Inhale 1 puff into the lungs 2 (two) times daily.  60 each  2  . gabapentin (NEURONTIN) 600 MG tablet TAKE 1 TABLET THREE TIMES DAILY   270 tablet  PRN  . levothyroxine (SYNTHROID, LEVOTHROID) 75 MCG tablet Take 1 tablet by mouth  daily  90 tablet  3  . loratadine (CLARITIN) 10 MG tablet Take 10 mg by mouth daily.      . metFORMIN (GLUCOPHAGE) 500 MG tablet TAKE ONE TABLET BY MOUTH TWICE DAILY WITH MEALS  180 tablet  2  . montelukast (SINGULAIR) 10 MG tablet Take 1 tablet (10 mg total) by mouth daily.  90 tablet  3  . mupirocin ointment (BACTROBAN) 2 %       . nebivolol (BYSTOLIC) 5 MG tablet Take 1 tablet (5 mg total)  by mouth daily.  90 tablet  3  . NEXIUM 40 MG capsule       . oxyCODONE-acetaminophen (PERCOCET) 10-325 MG per tablet Take 1 tablet by mouth every 4 (four) hours as needed for pain.      . pravastatin (PRAVACHOL) 40 MG tablet Take 1 tablet by mouth  daily  90 tablet  3  . PROAIR HFA 108 (90 BASE) MCG/ACT inhaler Inhale 2 puffs into the  lungs every 6 hours as  needed  34 g  0  . SPIRIVA HANDIHALER 18 MCG inhalation capsule Inhale the contents of 1  capsule via HandiHaler  daily  30 capsule  0  . tamoxifen (NOLVADEX) 20 MG tablet Take 1 tablet by mouth  daily  90 tablet  0  . tiZANidine (ZANAFLEX) 4 MG capsule Take 4 mg by mouth daily.      . traZODone (DESYREL) 50 MG tablet Take 0.5-1 tablets (25-50  mg total) by mouth at  bedtime as needed for  sleep.  90 tablet  1  . triamcinolone (KENALOG) 0.1 % paste Place onto teeth as needed.       No current facility-administered medications for this visit.    SURGICAL HISTORY:  Past Surgical History  Procedure Laterality Date  . Pacemaker placement  2009    [facility] cardiology  . Knee surgery    . Foot surgery    . Tubal ligation    . Breast lumpectomy      REVIEW OF SYSTEMS:  A 10 point review of systems was conducted and is otherwise negative except for what is noted above.    Health Maintenance  Mammogram: 06/01/2013 Colonoscopy: Due 05/2014 Bone Density Scan:01/11/13 Pap Smear: s/p partial hysterectomy Eye Exam: 2014 Vitamin D Level: pending  today Lipid Panel: 2015    PHYSICAL EXAMINATION:  BP 126/53  Pulse 76  Temp(Src) 98.4 F (36.9 C) (Oral)  Resp 20  Ht 5' 1"  (1.549 m)  Wt 214 lb 14.4 oz (97.478 kg)  BMI 40.63 kg/m2 GENERAL: Patient is a well appearing female in no acute distress HEENT:  Sclerae anicteric.  Oropharynx clear and moist. No ulcerations or evidence of oropharyngeal candidiasis. Neck is supple.  NODES:  No cervical, supraclavicular, or axillary lymphadenopathy palpated.  BREAST EXAM:  Left breast s/p lumpectomy, no nodularity, no masses, right breast no masses or nodules.  Benign bilateral breast exam.   LUNGS:  Clear to auscultation bilaterally.  No wheezes or rhonchi. HEART:  Regular rate and rhythm. No murmur appreciated. ABDOMEN:  Soft, nontender.  Positive, normoactive bowel sounds. No organomegaly palpated. MSK:  No focal spinal tenderness to palpation. Full range of motion bilaterally in the upper extremities. EXTREMITIES:  No peripheral edema.   SKIN:  Clear with no obvious rashes or skin changes. No nail dyscrasia. NEURO:  Nonfocal. Well oriented.  Appropriate affect. ECOG PERFORMANCE STATUS: 1 - Symptomatic but completely ambulatory    LABORATORY DATA: Lab Results  Component Value Date   WBC 7.3 05/04/2014   HGB 11.6 05/04/2014   HCT 36.4 05/04/2014   MCV 89.0 05/04/2014   PLT 276 05/04/2014      Chemistry      Component Value Date/Time   NA 141 05/04/2014 1427   NA 139 08/08/2012 1336   K 4.6 05/04/2014 1427   K 5.1 08/08/2012 1336   CL 106 09/23/2012 0947   CL 105 08/08/2012 1336   CO2 22 05/04/2014 1427   CO2 27 08/08/2012 1336   BUN 14.3 05/04/2014 1427   BUN 14 08/08/2012 1336   CREATININE 0.7 05/04/2014 1427   CREATININE 0.78 08/08/2012 1336   CREATININE 0.74 03/17/2012 1004      Component Value Date/Time   CALCIUM 9.0 05/04/2014 1427   CALCIUM 9.7 08/08/2012 1336   ALKPHOS 83 05/04/2014 1427   ALKPHOS 68 08/08/2012 1336   AST 28 05/04/2014 1427   AST 20 08/08/2012 1336   ALT  26 05/04/2014 1427   ALT 15 08/08/2012 1336   BILITOT <0.20 05/04/2014 1427   BILITOT 0.3 08/08/2012 1336       RADIOGRAPHIC STUDIES: Study Result    CLINICAL DATA: History of left lumpectomy in 2011. The patient  takes tamoxifen.  EXAM:  DIGITAL DIAGNOSTIC BILATERAL MAMMOGRAM with CAD  COMPARISON: 04/04/2012  ACR Breast Density Category b: There are scattered areas of  fibroglandular density.  FINDINGS:  Postoperative changes are seen in the lateral portion of the left  breast. No suspicious mass, distortion, or microcalcifications  identified in either breast.  Mammographic images were processed with CAD.  IMPRESSION:  1. No mammographic evidence for malignancy.  2. Expected post lumpectomy changes on the left.  RECOMMENDATION:  Diagnostic mammogram is suggested in 1 year.     No results found.RADIOLOGY REPORT*  Clinical Data: Post menopausal osteoporosis screening. The patient  is on Tamoxifen. The patient is also on gabopentin.  DUAL X-RAY ABSORPTIOMETRY (DXA) FOR BONE MINERAL DENSITY  AP LUMBAR SPINE L1-L4  Bone Mineral Density (BMD): 0.809 g/cm2  Young Adult T Score: -2.2  Z Score: -0.5  LEFT FEMUR THE NECK  Bone Mineral Density (BMD): 0.579 g/cm2  Young Adult T Score: -2.4  Z Score: -1.0  ASSESSMENT: Patient's diagnostic category is LOW BONE MASS by WHO  Criteria.  FRACTURE RISK: INCREASED  FRAX: Based on the Rigby model, the 10  year probability of a major osteoporotic fracture is 31%. The 10  year probability of a hip fracture is 3.1%.  Comparison: No significant change in bone density of the lumbar  spine compared to 10/24/2009. 7.7% decrease in bone density  over  the total left hip compared to 2011.  RECOMMENDATIONS:  All patients should ensure an adequate intake of dietary calcium  (1266m daily) and vitamin D (800 IU daily) unless contraindicated.  The National Osteoporosis Foundation recommends that FDA-approved  medical  therapies be considered in postmenopausal women and mean  age 1561or older with a:  1) Hip or vertebral (clinical or morphometric) fracture.  2) T-score of -2.5 or lower at the spine or hip.  3) Ten-year fracture probability by FRAX of 3% or greater for hip  fracture or 20% or greater for major osteoporotic fracture.  FOLLOW-UP:   ASSESSMENT: 63year old female with  #1 stage I invasive ductal carcinoma measuring 0.5 cm ER positive PR positive HER-2/neu negative. Patient is status post lumpectomy 04/22/2010 followed by partial breast radiation. She was then begun on adjuvant tamoxifen 20 mg daily overall she seems to be doing well her hot flashes are stable.   PLAN:  I reviewed Kameria's labs with her which were stable.  She has no sign of recurrence.  I told her that if her quality of life is so poor on the Tamoxifen, that she should stop it.  She has early stage disease, and she has been on it for 4 years now.  Due to the leg fracture and osteopenia, I wouldn't switch her to an aromatase inhibitor at this point either.  She told me she would think about it.   I told her that we could look into alternative bisphosphanate therapy such as oral agents, or prolia since she had such a hard time with that.  She told me that she would like to proceed with Zometa today and see how it does.  She will let [place] if her adverse effects are worse or better.  We did review her health maintenance.  I will call Dr. MLacie Scottsoffice for a copy of the bone density.  Otherwise, it appears that she is up to date.    JSharinwill return in 6 months for labs, and an appt.    All questions were answered. The patient knows to call the clinic with any problems, questions or concerns. We can certainly see the patient much sooner if necessary.  I spent 25 minutes counseling the patient face to face. The total time spent in the appointment was 30 minutes.  LMinette Headland [phone]/06/2014, 3:31 PM

## 2014-05-04 NOTE — Telephone Encounter (Signed)
, °

## 2014-05-04 NOTE — Patient Instructions (Signed)

## 2014-05-05 LAB — VITAMIN D 25 HYDROXY (VIT D DEFICIENCY, FRACTURES): VIT D 25 HYDROXY: 65 ng/mL (ref 30–89)

## 2014-05-07 ENCOUNTER — Telehealth: Payer: Self-pay | Admitting: *Deleted

## 2014-05-07 NOTE — Telephone Encounter (Signed)
Message copied by Harmon Pier on Mon May 07, 2014  2:21 PM ------      Message from: Minette Headland      Created: Mon May 07, 2014 11:50 AM       Please call patient with Vitamin D results.              Thanks,       LC       ----- Message -----         From: [facility]         Sent: 05/04/2014   2:39 PM           To: Minette Headland, NP                   ------

## 2014-05-07 NOTE — Telephone Encounter (Signed)
Called to communicate with pt about Vit D level(65). Pt was not home but talked to her brother. He said he would relate information to her about lab results which were WNL's. I will also mail her a copy of labs. Message to be forwarded to Charlestine Massed, NP.

## 2014-05-08 ENCOUNTER — Telehealth: Payer: Self-pay | Admitting: *Deleted

## 2014-05-08 ENCOUNTER — Telehealth: Payer: Self-pay | Admitting: Nurse Practitioner

## 2014-05-08 ENCOUNTER — Telehealth: Payer: Self-pay | Admitting: Adult Health

## 2014-05-08 ENCOUNTER — Ambulatory Visit: Payer: Medicare Other | Admitting: Nurse Practitioner

## 2014-05-08 NOTE — Telephone Encounter (Signed)
Per Marcie Bal pt cancelled but has r/s from 09/15 to 09/16 w/CB, pt is aware...KJ

## 2014-05-08 NOTE — Telephone Encounter (Signed)
PT. DID NOT COME FOR HER 3:00PM APPOINTMENT WITH [name],NP. CALLED PT. SHE STATES AFTER SHE TOOK THE BENADRYL AND PEPCID SHE "PASSED OUT". QUESTIONED PT. TO SEE WHAT SHE MEANT BY "PASSED OUT". PT. SAID SHE WENT TO SLEEP BUT WOKE UP AROUND 3:00PM AND CALLED THIS OFFICE TO SAY SHE WOULD NOT BE ABLE TO COME TO HER APPOINTMENT. PT. STATES THE RASH HAS "GONE DOWN" BUT THE PALMS OF HER HANDS ARE ITCHING AND HER MOUTH IS SORE. ASKED PT. IF SHE WOULD LIKE TO SEE [name],NP TOMORROW WHICH SHE AGREED. VERBAL ORDER AND READ BACK TO [name],NP- PT. MAY TAKE PEPCID 20MG TWICE A DAY AS NEEDED AND BENADRYL 12.5MG [1/2 TABLET] TO 25MG EVERY SIX HOURS AS NEEDED. PT. TO COME FOR AN APPOINTMENT AT 11:00AM TOMORROW. ABOVE INSTRUCTIONS EXPLAINED TO PT. SHE RETURNED THE INFORMATION VIA TEACH BACK METHOD. SCHEDULER TO RESCHEDULE PT.'S APPOINTMENT TO TOMORROW.

## 2014-05-08 NOTE — Telephone Encounter (Signed)
ALSO INFORMED PT. TO GO TO THE EMERGENCY ROOM IF SHE BECOMES SHORT OF BREATH OR FEELS LIKE HER THROAT IS CLOSING. PT. VOICES UNDERSTANDING.

## 2014-05-08 NOTE — Telephone Encounter (Signed)
per nelsa pt need to be added...done.Marland KitchenMarland Kitchen

## 2014-05-08 NOTE — Telephone Encounter (Signed)
   Provider input needed: RASH AND "TENDER MOUTH"    Reason for call: [place]  INTEGUMENT-FINE RASH ALL OVER HER BODY WITH ITCHING FOR THE PAST 24 HOURS.   ALLERGIES:  is allergic to accupril; baking soda-fluoride; clindamycin; codeine; fluticasone; losartan potassium; nasonex; penicillins; quinine; rifampin; sulfonamide derivatives; and miralax.  Patient last received chemotherapy/ treatment on n/a  Patient was last seen in the office on 05/04/14.  Next appt is 11/01/13.  Is patient having fevers greater than 100.5?  no   Is patient having uncontrolled pain, or new pain? no   Is patient having new back pain that changes with position (worsens or eases when laying down?)  no   Is patient able to eat and drink? yes    Is patient able to pass stool without difficulty?   yes     Is patient having uncontrolled nausea?  no    patient calls 05/08/2014 with complaint of  RASH  AND SORE MOUTH.  Summary Based on the above information advised patient to  VERBAL ORDER AND READ BACK TO [name],NP- PT. TO TAKE BENADRYL 25MG AND PEPCID 20MG NOW. PT. TO BE SEEN IN THIS OFFICE TODAY AT 3:00PM. NOTIFIED PT. SHE VOICES UNDERSTANDING.   [name] M  05/08/2014, 11:57 AM   Background Info  Tanya Hogan   DOB: 1951/06/20   MR#: [id number]   CSN#   [id number] 05/08/2014

## 2014-05-09 ENCOUNTER — Telehealth: Payer: Self-pay | Admitting: Adult Health

## 2014-05-09 ENCOUNTER — Telehealth: Payer: Self-pay

## 2014-05-09 ENCOUNTER — Ambulatory Visit (HOSPITAL_BASED_OUTPATIENT_CLINIC_OR_DEPARTMENT_OTHER): Payer: Medicare Other | Admitting: Nurse Practitioner

## 2014-05-09 VITALS — BP 134/55 | HR 80 | Temp 97.5°F | Resp 19 | Ht 61.0 in | Wt 212.8 lb

## 2014-05-09 DIAGNOSIS — Z853 Personal history of malignant neoplasm of breast: Secondary | ICD-10-CM

## 2014-05-09 DIAGNOSIS — I10 Essential (primary) hypertension: Secondary | ICD-10-CM

## 2014-05-09 DIAGNOSIS — Z79810 Long term (current) use of selective estrogen receptor modulators (SERMs): Secondary | ICD-10-CM

## 2014-05-09 DIAGNOSIS — R21 Rash and other nonspecific skin eruption: Secondary | ICD-10-CM

## 2014-05-09 DIAGNOSIS — C50919 Malignant neoplasm of unspecified site of unspecified female breast: Secondary | ICD-10-CM

## 2014-05-09 DIAGNOSIS — R6884 Jaw pain: Secondary | ICD-10-CM

## 2014-05-09 NOTE — Telephone Encounter (Signed)
lvm with triage nurse at Dr Gardiner Ramus office that pt wants the results of her bone density. They had told her husband everything was OK, she wants more detail.

## 2014-05-09 NOTE — Telephone Encounter (Signed)
returned pt call no answer.Tanya KitchenMarland KitchenMarland Hogan

## 2014-05-10 ENCOUNTER — Encounter: Payer: Self-pay | Admitting: Nurse Practitioner

## 2014-05-10 ENCOUNTER — Telehealth: Payer: Self-pay

## 2014-05-10 DIAGNOSIS — R21 Rash and other nonspecific skin eruption: Secondary | ICD-10-CM | POA: Insufficient documentation

## 2014-05-10 DIAGNOSIS — R6884 Jaw pain: Secondary | ICD-10-CM | POA: Insufficient documentation

## 2014-05-10 NOTE — Assessment & Plan Note (Signed)
Patient  continues on tamoxifen 20 mg on a daily basis. she denies any specific side effects that would warrant discontinuation of this medication.  She has plans to followup care the [place] approximate 6 months in March 2016.

## 2014-05-10 NOTE — Assessment & Plan Note (Signed)
Patient is complaining of some mild, chronic right jaw pain for approximate 2 weeks.  She does have poor dentition; specifically at to the right back teeth.  There was also noted that her gums appear somewhat inflamed in general.   patient denies any specific dental pain whatsoever.  Patient was advised to follow back up with her dentist tomorrow.  Will also need to consider imaging if symptoms persist.

## 2014-05-10 NOTE — Progress Notes (Signed)
[place]   Chief Complaint  Patient presents with  . Rash    HPI: Tanya Hogan 63 y.o. female diagnosed with breast cancer.  She is status post lumpectomy; and is currently undergoing tamoxifen therapy.  Patient called the cancer Center yesterday afternoon complaining of new onset rash.  She is attributing the rash to the Zometa infusion she received just this past Friday, 05/04/2014.  She states that she has had some mild reaction symptoms when she received Zometa in the past as well.  She states that she has a generalized rash; and some itching.  She denied any airway or swallowing issues whatsoever.  Also, patient is complaining of some vague, chronic right jaw discomfort.  She denies any specific dental issues whatsoever.  She states she had her teeth cleaned approximately 2 months ago; had no issues at all at that time.  She denies any recent fevers or chills.  Patient was encouraged to come to the [facility] for further evaluation; but patient instead took Benadryl and went to sleep.  Patient was called again; and patient reported that she did indeed take both Benadryl and Pepcid as directed earlier; and her rash was greatly improved.  The patient preferred to wait till today to come to the [facility].   Rash     Review of Systems  Skin: Positive for rash.    Past Medical History  Diagnosis Date  . HTN (hypertension)   . CHF (congestive heart failure)   . LBBB (left bundle branch block)   . Fibromyalgia   . Alkaline phosphatase elevation   . Hypothyroidism   . Closed fracture of unspecified part of radius (alone)   . Depression   . DDD (degenerative disc disease), lumbar   . Carpal tunnel syndrome   . IBS (irritable bowel syndrome)   . Breast cancer     Past Surgical History  Procedure Laterality Date  . Pacemaker placement  2009    [facility] cardiology  . Knee surgery    . Foot surgery    . Tubal ligation    . Breast lumpectomy       has INVASIVE DUCTAL CARCINOMA, LEFT BREAST; UNSPECIFIED HYPOTHYROIDISM; DM; DIABETIC PERIPHERAL NEUROPATHY; HYPERLIPIDEMIA; DEPRESSION; ESSENTIAL HYPERTENSION, BENIGN; LEFT BUNDLE BRANCH BLOCK; CHF; BACK PAIN, CHRONIC; OSTEOPENIA; FATIGUE; ALKALINE PHOSPHATASE, ELEVATED; PACEMAKER, PERMANENT; Obesity; COPD (chronic obstructive pulmonary disease); Closed nondisplaced fracture of distal phalanx of left great toe; I; Obesity, Class II, BMI 35-39.9, with comorbidity; Rash; and Jaw pain on her problem list.     is allergic to accupril; baking soda-fluoride; clindamycin; codeine; fluticasone; losartan potassium; nasonex; penicillins; quinine; rifampin; sulfonamide derivatives; and miralax.    Medication List       This list is accurate as of: 05/09/14 11:59 PM.  Always use your most recent med list.               ACCU-CHEK SMARTVIEW test strip  Generic drug:  glucose blood  Test two times daily     AMBULATORY NON FORMULARY MEDICATION  True Result test strips.  Diagnosis: Diabetes Mellitus  Testing 2 times a day     AMBULATORY NON FORMULARY MEDICATION  Medication Name: Needs tubing and suplies for her nebulizer machine. Dx COPD. [facility]     DULoxetine 60 MG capsule  Commonly known as:  CYMBALTA  Take 1/2 capsule by mouth  daily     Fluticasone-Salmeterol 500-50 MCG/DOSE Aepb  Commonly known as:  ADVAIR DISKUS  Inhale 1 puff into the lungs 2 (two) times daily.     gabapentin 600 MG tablet  Commonly known as:  NEURONTIN  TAKE 1 TABLET TWO TIMES DAILY, taking as needed     levothyroxine 75 MCG tablet  Commonly known as:  SYNTHROID, LEVOTHROID  Take 1 tablet by mouth  daily     loratadine 10 MG tablet  Commonly known as:  CLARITIN  Take 10 mg by mouth daily as needed.     metFORMIN 500 MG tablet  Commonly known as:  GLUCOPHAGE  TAKE ONE TABLET BY MOUTH TWICE DAILY WITH MEALS     montelukast 10 MG tablet  Commonly known as:  SINGULAIR  Take 1 tablet (10 mg total)  by mouth daily.     mupirocin ointment 2 %  Commonly known as:  BACTROBAN  To nose     nebivolol 5 MG tablet  Commonly known as:  BYSTOLIC  Take 1 tablet (5 mg total)  by mouth daily.     NEXIUM 40 MG capsule  Generic drug:  esomeprazole     oxyCODONE-acetaminophen 10-325 MG per tablet  Commonly known as:  PERCOCET  Take 1 tablet by mouth every 4 (four) hours as needed for pain.     pravastatin 40 MG tablet  Commonly known as:  PRAVACHOL  Take 1 tablet by mouth  daily     PROAIR HFA 108 (90 BASE) MCG/ACT inhaler  Generic drug:  albuterol  Inhale 2 puffs into the  lungs every 6 hours as  needed     SPIRIVA HANDIHALER 18 MCG inhalation capsule  Generic drug:  tiotropium  Inhale the contents of 1  capsule via HandiHaler  daily     tamoxifen 20 MG tablet  Commonly known as:  NOLVADEX  Take 1 tablet by mouth  daily         PHYSICAL EXAMINATION  Blood pressure 134/55, pulse 80, temperature 97.5 F (36.4 C), temperature source Oral, resp. rate 19, height 5' 1"  (1.549 m), weight 212 lb 12.8 oz (96.525 kg), SpO2 98.00%.  Physical Exam  Nursing note and vitals reviewed. Constitutional: She is oriented to person, place, and time and well-developed, well-nourished, and in no distress. No distress.  HENT:  Head: Normocephalic and atraumatic.  Right Ear: External ear normal.  Left Ear: External ear normal.  Mouth/Throat: Oropharynx is clear and moist. No oropharyngeal exudate.  Questionable mild decay to right upper back tooth.  All gums do appear erythematous and inflamed.  No tenderness or fullness/abnormalities right jaw area.  Eyes: Conjunctivae are normal. Pupils are equal, round, and reactive to light. No scleral icterus.  Neck: Normal range of motion. Neck supple. No JVD present. No thyromegaly present.  Cardiovascular: Normal rate, regular rhythm, normal heart sounds and intact distal pulses.  Exam reveals no friction rub.   No murmur heard. Pulmonary/Chest: Effort  normal and breath sounds normal. No stridor. No respiratory distress. She has no wheezes.  Abdominal: Soft. Bowel sounds are normal.  Musculoskeletal: Normal range of motion. She exhibits no edema and no tenderness.  Lymphadenopathy:    She has no cervical adenopathy.  Neurological: She is alert and oriented to person, place, and time. Gait normal.  Skin: Skin is warm and dry. No rash noted. No erythema.  Psychiatric: Affect normal.   ASSESSMENT/PLAN:    INVASIVE DUCTAL CARCINOMA, LEFT BREAST  Assessment & Plan Patient  continues on tamoxifen 20 mg on a daily basis. she denies any specific side effects that  would warrant discontinuation of this medication.  She has plans to followup care the [place] approximate 6 months in March 2016.   Rash  Assessment & Plan Patient received Zometa on Friday, 05/04/2014.  His previously experienced some rash and general myalgias following her last Zometa infusion.  She states that she did develop a rash on Monday morning 05/07/2014.  She complained of generalized pruritus; but denies any airway or breathing issues.  Patient was instructed by phone to take Benadryl 25 mg every 6 hours and Pepcid 20 mg by every 12 hours.  Patient was unable to come to the [facility] or to the emergency department for further evaluation at that time; but states that the rash did essentially resolve with these medications as directed.  She is still complaining of some mild itching; but there is no further rash.  Will hold any future Zometa due to multiple issues with Zometa in the past.  Patient was advised to continue taking both Benadryl and Pepcid as needed for any further itching.  She was also advised to go directly to the emergency department if she develops any worsening reaction symptoms whatsoever.     Jaw pain  Assessment & Plan Patient is complaining of some mild, chronic right jaw pain for approximate 2 weeks.  She does have poor dentition; specifically  at to the right back teeth.  There was also noted that her gums appear somewhat inflamed in general.   patient denies any specific dental pain whatsoever.  Patient was advised to follow back up with her dentist tomorrow.  Will also need to consider imaging if symptoms persist.    Also, patient was questioning the results of her most recent bone density test.  Will have the cancer Center nurse call Dr. Gardiner Ramus  office regarding bone density test results.  Primary care provider's nurse will then call results of bone density test to patient.      Patient stated understanding of all instructions; and was in agreement with this plan of care. The patient knows to call the clinic with any problems, questions or concerns.   Review/collaboration with Dr. Lindi Adie regarding all aspects of patient's visit today.   Total time spent with patient was 40 minutes;  with greater than 75 percent of that time spent in face to face counseling regarding her symptoms, communication with greater physician's office regarding bone density test, and coordination of care and follow up.  Disclaimer: This note was dictated with voice recognition software. Similar sounding words can inadvertently be transcribed and may not be corrected upon review.   Drue Second, NP 05/10/2014

## 2014-05-10 NOTE — Assessment & Plan Note (Signed)
Patient received Zometa on Friday, 05/04/2014.  His previously experienced some rash and general myalgias following her last Zometa infusion.  She states that she did develop a rash on Monday morning 05/07/2014.  She complained of generalized pruritus; but denies any airway or breathing issues.  Patient was instructed by phone to take Benadryl 25 mg every 6 hours and Pepcid 20 mg by every 12 hours.  Patient was unable to come to the [facility] or to the emergency department for further evaluation at that time; but states that the rash did essentially resolve with these medications as directed.  She is still complaining of some mild itching; but there is no further rash.  Will hold any future Zometa due to multiple issues with Zometa in the past.  Patient was advised to continue taking both Benadryl and Pepcid as needed for any further itching.  She was also advised to go directly to the emergency department if she develops any worsening reaction symptoms whatsoever.

## 2014-05-10 NOTE — Telephone Encounter (Signed)
I don't see the report. I looked under [facility] and I looked under the scan tab and didn't see the report.

## 2014-05-10 NOTE — Telephone Encounter (Signed)
Tanya Hogan would like the results from her last Dexa scan. Please advise.

## 2014-05-14 ENCOUNTER — Encounter: Payer: Self-pay | Admitting: Internal Medicine

## 2014-05-14 ENCOUNTER — Ambulatory Visit (INDEPENDENT_AMBULATORY_CARE_PROVIDER_SITE_OTHER): Payer: Medicare Other | Admitting: Internal Medicine

## 2014-05-14 VITALS — BP 152/76 | HR 86 | Ht 61.0 in | Wt 213.6 lb

## 2014-05-14 DIAGNOSIS — Z950 Presence of cardiac pacemaker: Secondary | ICD-10-CM

## 2014-05-14 DIAGNOSIS — I5022 Chronic systolic (congestive) heart failure: Secondary | ICD-10-CM

## 2014-05-14 DIAGNOSIS — I428 Other cardiomyopathies: Secondary | ICD-10-CM

## 2014-05-14 DIAGNOSIS — I509 Heart failure, unspecified: Secondary | ICD-10-CM

## 2014-05-14 DIAGNOSIS — IMO0001 Reserved for inherently not codable concepts without codable children: Secondary | ICD-10-CM

## 2014-05-14 LAB — MDC_IDC_ENUM_SESS_TYPE_INCLINIC
Brady Statistic AP VS Percent: 0 %
Brady Statistic AS VS Percent: 0 %
Implantable Pulse Generator Model: 8042
Lead Channel Impedance Value: 672 Ohm
Lead Channel Pacing Threshold Amplitude: 1 V
Lead Channel Pacing Threshold Amplitude: 1 V
Lead Channel Pacing Threshold Pulse Width: 0.5 ms
Lead Channel Pacing Threshold Pulse Width: 0.6 ms
Lead Channel Pacing Threshold Pulse Width: 0.8 ms
Lead Channel Sensing Intrinsic Amplitude: 5.6 mV
Lead Channel Setting Pacing Amplitude: 2.5 V
Lead Channel Setting Pacing Amplitude: 2.5 V
Lead Channel Setting Pacing Pulse Width: 0.6 ms
Lead Channel Setting Pacing Pulse Width: 0.8 ms
MDC IDC MSMT BATTERY REMAINING LONGEVITY: 14 mo
MDC IDC MSMT BATTERY VOLTAGE: 2.89 V
MDC IDC MSMT LEADCHNL LV IMPEDANCE VALUE: 1292 Ohm
MDC IDC MSMT LEADCHNL LV PACING THRESHOLD AMPLITUDE: 1 V
MDC IDC MSMT LEADCHNL RA IMPEDANCE VALUE: 615 Ohm
MDC IDC MSMT LEADCHNL RA SENSING INTR AMPL: 2.8 mV
MDC IDC SESS DTM: 20150921091641
MDC IDC SET LEADCHNL RA PACING AMPLITUDE: 2 V
MDC IDC SET LEADCHNL RV SENSING SENSITIVITY: 2.8 mV
MDC IDC STAT BRADY AP VP PERCENT: 6 %
MDC IDC STAT BRADY AS VP PERCENT: 94 %

## 2014-05-14 NOTE — Assessment & Plan Note (Signed)
Her Medtronic DDD PM is working normally. Will recheck in several months.

## 2014-05-14 NOTE — Assessment & Plan Note (Signed)
Her symptoms remain class 2. She will continue her current meds.

## 2014-05-14 NOTE — Patient Instructions (Signed)
Your physician recommends that you continue on your current medications as directed. Please refer to the Current Medication list given to you today.  Your physician wants you to follow-up in: 6 months with Dr. Lovena Le.  You will receive a reminder letter in the mail two months in advance. If you don't receive a letter, please call our office to schedule the follow-up appointment.  Your physician wants you to follow-up in: 1 year with Dr. Lovena Le.  You will receive a reminder letter in the mail two months in advance. If you don't receive a letter, please call our office to schedule the follow-up appointment.

## 2014-05-14 NOTE — Assessment & Plan Note (Signed)
We discussed the importance of weight loss. I asked that she eat less and increase her physical activity.

## 2014-05-14 NOTE — Progress Notes (Signed)
HPI Mrs. Dant returns today for followup. She is pleasant 63 year old woman with a history of obesity, hypertension, sleep apnea, chronic systolic heart failure, status post biventricular pacemaker insertion. She denies chest pain, shortness of breath, or peripheral edema. No syncope. She is been bothered by falling and she broke her leg. She was unable take ACE inhibitors or ARB's secondary to hyperkalemia.  She denies chest pain. She has class II heart failure symptoms. She denies peripheral edema. No syncope or palpitations. Allergies  Allergen Reactions  . Accupril [Quinapril Hcl] Other (See Comments)    Hyperkalemia  . Baking Soda-Fluoride [Sodium Fluoride] Itching  . Clindamycin   . Codeine   . Fluticasone Other (See Comments)    Nasal ulcer.    . Losartan Potassium Itching  . Nasonex [Mometasone Furoate] Other (See Comments)    nosebleed  . Penicillins   . Quinine   . Rifampin   . Sulfonamide Derivatives   . Miralax [Polyethylene Glycol] Rash     Current Outpatient Prescriptions  Medication Sig Dispense Refill  . ACCU-CHEK SMARTVIEW test strip Test two times daily  100 each  11  . AMBULATORY NON FORMULARY MEDICATION True Result test strips.  Diagnosis: Diabetes Mellitus  Testing 2 times a day  100 each  4  . AMBULATORY NON FORMULARY MEDICATION Medication Name: Needs tubing and suplies for her nebulizer machine. Dx COPD. [facility]  1 vial  0  . DULoxetine (CYMBALTA) 60 MG capsule Take 1/2 capsule by mouth  daily      . Fluticasone-Salmeterol (ADVAIR DISKUS) 500-50 MCG/DOSE AEPB Inhale 1 puff into the lungs 2 (two) times daily.  60 each  2  . gabapentin (NEURONTIN) 600 MG tablet TAKE 1 TABLET TWO TIMES DAILY, taking as needed      . levothyroxine (SYNTHROID, LEVOTHROID) 75 MCG tablet Take 1 tablet by mouth  daily  90 tablet  3  . loratadine (CLARITIN) 10 MG tablet Take 10 mg by mouth daily as needed.       . metFORMIN (GLUCOPHAGE) 500 MG tablet TAKE ONE TABLET BY  MOUTH TWICE DAILY WITH MEALS  180 tablet  2  . montelukast (SINGULAIR) 10 MG tablet Take 1 tablet (10 mg total) by mouth daily.  90 tablet  3  . mupirocin ointment (BACTROBAN) 2 % To nose      . nebivolol (BYSTOLIC) 5 MG tablet Take 1 tablet (5 mg total)  by mouth daily.  90 tablet  3  . NEXIUM 40 MG capsule       . oxyCODONE-acetaminophen (PERCOCET) 10-325 MG per tablet Take 1 tablet by mouth every 4 (four) hours as needed for pain.      . pravastatin (PRAVACHOL) 40 MG tablet Take 1 tablet by mouth  daily  90 tablet  3  . PROAIR HFA 108 (90 BASE) MCG/ACT inhaler Inhale 2 puffs into the  lungs every 6 hours as  needed  34 g  0  . SPIRIVA HANDIHALER 18 MCG inhalation capsule Inhale the contents of 1  capsule via HandiHaler  daily  30 capsule  0  . tamoxifen (NOLVADEX) 20 MG tablet Take 1 tablet by mouth  daily  90 tablet  0   No current facility-administered medications for this visit.     Past Medical History  Diagnosis Date  . HTN (hypertension)   . CHF (congestive heart failure)   . LBBB (left bundle branch block)   . Fibromyalgia   . Alkaline phosphatase elevation   .  Hypothyroidism   . Closed fracture of unspecified part of radius (alone)   . Depression   . DDD (degenerative disc disease), lumbar   . Carpal tunnel syndrome   . IBS (irritable bowel syndrome)   . Breast cancer     ROS:   All systems reviewed and negative except as noted in the HPI.   Past Surgical History  Procedure Laterality Date  . Pacemaker placement  2009    [facility] cardiology  . Knee surgery    . Foot surgery    . Tubal ligation    . Breast lumpectomy       Family History  Problem Relation Age of Onset  . Heart failure    . Breast cancer    . Hypertension    . Melanoma Mother      History   Social History  . Marital Status: Divorced    Spouse Name: N/A    Number of Children: N/A  . Years of Education: N/A   Occupational History  . Not on file.   Social History Main Topics   . Smoking status: Former Smoker    Quit date: 08/24/1966  . Smokeless tobacco: Not on file  . Alcohol Use: No  . Drug Use: No  . Sexual Activity: Not Currently   Other Topics Concern  . Not on file   Social History Narrative  . No narrative on file     There were no vitals taken for this visit.  Physical Exam:  Well appearing 26 old woman, NAD HEENT: Unremarkable Neck:   7 cm JVD, no thyromegally Lungs:  Clear with no wheezes, rales, or rhonchi. HEART:  Regular rate rhythm, no murmurs, no rubs, no clicks Abd:  soft, positive bowel sounds, no organomegally, no rebound, no guarding Ext:  2 plus pulses, no edema, no cyanosis, no clubbing Skin:  No rashes no nodules Neuro:  CN II through XII intact, motor grossly intact  DEVICE  Normal device function.  See PaceArt for details. Approx. 1 year from ERI.  Assess/Plan:

## 2014-05-15 ENCOUNTER — Telehealth: Payer: Self-pay | Admitting: *Deleted

## 2014-05-15 NOTE — Telephone Encounter (Signed)
Tanya Lesser, NP asked me to call patient in regards to appointment on 05/09/14 with her and followup. Asked patient if she has followed up with dentist. Per patient she thought we were going to call her back. I told patient that Tanya Lesser, NP would like her to go ahead and get appt with her dentist. Patient states she cannot afford to go this month but can next month. Encouraged patient to go ahead and get appointment with them as soon as possible next month and to call us when any further questions. Patient agreeable to this.

## 2014-05-16 ENCOUNTER — Ambulatory Visit: Payer: Medicare Other | Admitting: Family Medicine

## 2014-05-17 NOTE — Telephone Encounter (Signed)
Printed and placed in box.

## 2014-05-22 ENCOUNTER — Encounter: Payer: Self-pay | Admitting: Family Medicine

## 2014-05-25 ENCOUNTER — Ambulatory Visit (INDEPENDENT_AMBULATORY_CARE_PROVIDER_SITE_OTHER): Payer: Medicare Other | Admitting: Family Medicine

## 2014-05-25 ENCOUNTER — Encounter: Payer: Self-pay | Admitting: Family Medicine

## 2014-05-25 VITALS — BP 130/72 | HR 68 | Ht 61.0 in | Wt 196.0 lb

## 2014-05-25 DIAGNOSIS — I10 Essential (primary) hypertension: Secondary | ICD-10-CM

## 2014-05-25 DIAGNOSIS — Z6837 Body mass index (BMI) 37.0-37.9, adult: Secondary | ICD-10-CM

## 2014-05-25 DIAGNOSIS — Z23 Encounter for immunization: Secondary | ICD-10-CM

## 2014-05-25 DIAGNOSIS — E118 Type 2 diabetes mellitus with unspecified complications: Secondary | ICD-10-CM | POA: Insufficient documentation

## 2014-05-25 NOTE — Progress Notes (Signed)
   Subjective:    Patient ID: Tanya Hogan, female    DOB: [birth year], 63 y.o.   MRN: [id number]  HPI Here to f/u on labs done by oncology. She says her calcium was high. When i looked over her labs, her calcium is normal.  I reviewed her recent labwork.    She has llost about 5 lbs and is very excited about this.   She has questions about why she is taking a stating since she has great cholesterol.   Hypertension- Pt denies chest pain, SOB, dizziness, or heart palpitations.  Taking meds as directed w/o problems.  Denies medication side effects.    Had her flu shot this year.   Review of Systems     Objective:   Physical Exam  Constitutional: She is oriented to person, place, and time. She appears well-developed and well-nourished.  HENT:  Head: Normocephalic and atraumatic.  Neurological: She is alert and oriented to person, place, and time.  Skin: Skin is warm and dry.  Psychiatric: She has a normal mood and affect. Her behavior is normal.          Assessment & Plan:  Obesity - BMI 37. She ius doing well and has lost 5 lbs.    CHF/ DM- we discussed with her current medical history that a statin  to reduce risk of heart attack and stroke even know she does not have significantly elevated baseline cholesterol and encouraged to stay on it.  HTN- repeat BP at goal.  Keep f/u for DM in 2 months.    GIven prevnar 13 today.   Time spent 25 min, >50% spent counseling on her labs, medications, blood pressure.

## 2014-06-18 ENCOUNTER — Other Ambulatory Visit: Payer: Self-pay | Admitting: Family Medicine

## 2014-06-22 ENCOUNTER — Other Ambulatory Visit: Payer: Self-pay | Admitting: Family Medicine

## 2014-06-22 ENCOUNTER — Other Ambulatory Visit: Payer: Self-pay | Admitting: Adult Health

## 2014-06-22 DIAGNOSIS — C50912 Malignant neoplasm of unspecified site of left female breast: Secondary | ICD-10-CM

## 2014-06-28 ENCOUNTER — Encounter: Payer: Self-pay | Admitting: Family Medicine

## 2014-07-04 ENCOUNTER — Other Ambulatory Visit: Payer: Self-pay | Admitting: Family Medicine

## 2014-07-11 DIAGNOSIS — J3489 Other specified disorders of nose and nasal sinuses: Secondary | ICD-10-CM | POA: Insufficient documentation

## 2014-07-12 ENCOUNTER — Other Ambulatory Visit: Payer: Self-pay | Admitting: Family Medicine

## 2014-07-12 ENCOUNTER — Other Ambulatory Visit: Payer: Self-pay | Admitting: *Deleted

## 2014-07-12 MED ORDER — BUMETANIDE 1 MG PO TABS: 1.0000 mg | ORAL_TABLET | Freq: Two times a day (BID) | ORAL | Status: DC

## 2014-07-13 ENCOUNTER — Ambulatory Visit (INDEPENDENT_AMBULATORY_CARE_PROVIDER_SITE_OTHER): Payer: Medicare Other | Admitting: Family Medicine

## 2014-07-13 ENCOUNTER — Encounter: Payer: Self-pay | Admitting: Family Medicine

## 2014-07-13 VITALS — BP 119/69 | HR 16 | Temp 98.7°F | Ht 61.0 in | Wt 214.0 lb

## 2014-07-13 DIAGNOSIS — I5022 Chronic systolic (congestive) heart failure: Secondary | ICD-10-CM

## 2014-07-13 DIAGNOSIS — E118 Type 2 diabetes mellitus with unspecified complications: Secondary | ICD-10-CM

## 2014-07-13 DIAGNOSIS — I10 Essential (primary) hypertension: Secondary | ICD-10-CM

## 2014-07-13 LAB — POCT GLYCOSYLATED HEMOGLOBIN (HGB A1C): Hemoglobin A1C: 6.7

## 2014-07-13 MED ORDER — METFORMIN HCL 1000 MG PO TABS: 1000.0000 mg | ORAL_TABLET | Freq: Two times a day (BID) | ORAL | Status: DC

## 2014-07-13 NOTE — Progress Notes (Signed)
   Subjective:    Patient ID: Tanya Hogan, female    DOB: 06-11-1951, 63 y.o.   MRN: [id number]  HPI Hypertension- Pt denies chest pain, SOB, dizziness, or heart palpitations.  Taking meds as directed w/o problems.  Denies medication side effects.    Chronic systolic heart failure-Has gained about 8-10 lbs of luid.  Takes her bumex regularly. Says they gave her lasix injection while she was there.  No chest pain or shortness of breath.  Diabetes - no hypoglycemic events. No wounds or sores that are not healing well. + increased thirst .  No inc urination. Checking glucose at home. Taking medications as prescribed without any side effects.  Had CT with contrast for her sinuses and then had a biopsy on her nose.  Sugar was a little high yesterday. She feels really thirsty.  Has been drinking more sine the CT.    Review of Systems     Objective:   Physical Exam  Constitutional: She is oriented to person, place, and time. She appears well-developed and well-nourished.  HENT:  Head: Normocephalic and atraumatic.  Cardiovascular: Normal rate, regular rhythm and normal heart sounds.   Pulmonary/Chest: Effort normal and breath sounds normal.  Neurological: She is alert and oriented to person, place, and time.  Skin: Skin is warm and dry.  Psychiatric: She has a normal mood and affect. Her behavior is normal.          Assessment & Plan:  DM- well controlled. A1C is 6.7.  Continue current regimen. Follow-up in 3 months.  Hypertension-well-controlled. Continue current regimen.  Chronic systolic heart failure-Volume overload-she's actually been drinking extra fluid since the  CT scan. Okay to cut back on fluids again continue to restrict salt. Monitor weights daily. If not responding and weight is not coming down over the weekend and please let me know.

## 2014-07-16 ENCOUNTER — Other Ambulatory Visit: Payer: Self-pay | Admitting: *Deleted

## 2014-07-16 ENCOUNTER — Encounter: Payer: Self-pay | Admitting: Family Medicine

## 2014-07-16 ENCOUNTER — Telehealth: Payer: Self-pay | Admitting: *Deleted

## 2014-07-16 LAB — LIPID PANEL
CHOL/HDL RATIO: 2.1 ratio
Cholesterol: 160 mg/dL (ref 0–200)
HDL: 78 mg/dL (ref >39)
LDL Cholesterol: 62 mg/dL (ref 0–99)
Triglycerides: 99 mg/dL (ref <150)
VLDL: 20 mg/dL (ref 0–40)

## 2014-07-16 NOTE — Telephone Encounter (Signed)
Pt  Called and reported that she only takes cymbalta 30 mg she would like this reflected in her record. Change made.Tanya Hogan [place]

## 2014-07-20 ENCOUNTER — Telehealth: Payer: Self-pay | Admitting: Hematology and Oncology

## 2014-07-20 NOTE — Telephone Encounter (Signed)
m,

## 2014-07-26 LAB — HM DIABETES EYE EXAM

## 2014-07-27 ENCOUNTER — Other Ambulatory Visit: Payer: Self-pay | Admitting: Family Medicine

## 2014-07-27 DIAGNOSIS — Z9889 Other specified postprocedural states: Secondary | ICD-10-CM

## 2014-07-27 DIAGNOSIS — Z853 Personal history of malignant neoplasm of breast: Secondary | ICD-10-CM

## 2014-08-09 ENCOUNTER — Ambulatory Visit
Admission: RE | Admit: 2014-08-09 | Discharge: 2014-08-09 | Disposition: A | Discharge status: Home / Self Care | Payer: Medicare Other | Source: Ambulatory Visit | Attending: Family Medicine | Admitting: Family Medicine

## 2014-08-09 ENCOUNTER — Encounter (INDEPENDENT_AMBULATORY_CARE_PROVIDER_SITE_OTHER): Payer: Self-pay

## 2014-08-09 DIAGNOSIS — Z9889 Other specified postprocedural states: Secondary | ICD-10-CM

## 2014-08-09 DIAGNOSIS — Z853 Personal history of malignant neoplasm of breast: Secondary | ICD-10-CM

## 2014-08-21 ENCOUNTER — Encounter: Payer: Self-pay | Admitting: Internal Medicine

## 2014-08-21 ENCOUNTER — Encounter: Payer: Self-pay | Admitting: *Deleted

## 2014-08-21 ENCOUNTER — Ambulatory Visit (INDEPENDENT_AMBULATORY_CARE_PROVIDER_SITE_OTHER): Payer: Medicare Other | Admitting: Internal Medicine

## 2014-08-21 VITALS — BP 152/74 | HR 78 | Ht 61.75 in | Wt 211.4 lb

## 2014-08-21 DIAGNOSIS — I5022 Chronic systolic (congestive) heart failure: Secondary | ICD-10-CM

## 2014-08-21 DIAGNOSIS — Z950 Presence of cardiac pacemaker: Secondary | ICD-10-CM

## 2014-08-21 DIAGNOSIS — E669 Obesity, unspecified: Secondary | ICD-10-CM

## 2014-08-21 DIAGNOSIS — I10 Essential (primary) hypertension: Secondary | ICD-10-CM

## 2014-08-21 DIAGNOSIS — I442 Atrioventricular block, complete: Secondary | ICD-10-CM

## 2014-08-21 LAB — CBC WITH DIFFERENTIAL/PLATELET
BASOS ABS: 0 10*3/uL (ref 0.0–0.1)
Basophils Relative: 0.4 % (ref 0.0–3.0)
Eosinophils Absolute: 0.1 10*3/uL (ref 0.0–0.7)
Eosinophils Relative: 1.4 % (ref 0.0–5.0)
HCT: 39.8 % (ref 36.0–46.0)
HEMOGLOBIN: 12.5 g/dL (ref 12.0–15.0)
LYMPHS PCT: 26.5 % (ref 12.0–46.0)
Lymphs Abs: 2.5 10*3/uL (ref 0.7–4.0)
MCHC: 31.5 g/dL (ref 30.0–36.0)
MCV: 88.3 fl (ref 78.0–100.0)
MONOS PCT: 7 % (ref 3.0–12.0)
Monocytes Absolute: 0.7 10*3/uL (ref 0.1–1.0)
Neutro Abs: 6.2 10*3/uL (ref 1.4–7.7)
Neutrophils Relative %: 64.7 % (ref 43.0–77.0)
Platelets: 288 10*3/uL (ref 150.0–400.0)
RBC: 4.5 Mil/uL (ref 3.87–5.11)
RDW: 15.4 % (ref 11.5–15.5)
WBC: 9.5 10*3/uL (ref 4.0–10.5)

## 2014-08-21 LAB — MDC_IDC_ENUM_SESS_TYPE_INCLINIC
Battery Remaining Longevity: 13 mo
Battery Voltage: 2.88 V
Brady Statistic AP VP Percent: 14 %
Brady Statistic AP VS Percent: 0 %
Brady Statistic AS VS Percent: 0 %
Implantable Pulse Generator Model: 8042
Lead Channel Impedance Value: 1289 Ohm
Lead Channel Pacing Threshold Amplitude: 1 V
Lead Channel Pacing Threshold Amplitude: 1 V
Lead Channel Pacing Threshold Amplitude: 1 V
Lead Channel Pacing Threshold Pulse Width: 0.6 ms
Lead Channel Pacing Threshold Pulse Width: 0.8 ms
Lead Channel Sensing Intrinsic Amplitude: 2.8 mV
Lead Channel Setting Pacing Amplitude: 2 V
Lead Channel Setting Pacing Pulse Width: 0.6 ms
Lead Channel Setting Pacing Pulse Width: 0.8 ms
Lead Channel Setting Sensing Sensitivity: 2.8 mV
MDC IDC MSMT LEADCHNL RA IMPEDANCE VALUE: 657 Ohm
MDC IDC MSMT LEADCHNL RA PACING THRESHOLD PULSEWIDTH: 0.5 ms
MDC IDC MSMT LEADCHNL RV IMPEDANCE VALUE: 655 Ohm
MDC IDC SESS DTM: 20151229125526
MDC IDC SET LEADCHNL LV PACING AMPLITUDE: 2 V
MDC IDC SET LEADCHNL RV PACING AMPLITUDE: 2.5 V
MDC IDC STAT BRADY AS VP PERCENT: 86 %

## 2014-08-21 NOTE — Progress Notes (Signed)
HPI Tanya Hogan returns today for followup. She is pleasant 64 year old woman with a history of obesity, hypertension, sleep apnea, chronic systolic heart failure, status post biventricular pacemaker insertion. She denies chest pain, shortness of breath, or peripheral edema. No syncope. She is been bothered by falling and she broke her leg. She was unable take ACE inhibitors or ARB's secondary to hyperkalemia.  She denies chest pain. She has class II heart failure symptoms. She denies peripheral edema. No syncope or palpitations.  The patient was found to have an active recall PM and is PM dependent. She less than a year of battery longevity and presents today to discuss the pro's and con's of premature removal of her device and insertion of a new BiV PPM. Allergies  Allergen Reactions  . Penicillins Anaphylaxis  . Accupril [Quinapril Hcl] Other (See Comments)    Hyperkalemia  . Aspirin Other (See Comments)    Pt states "burns holes in stomach"  . Baking Soda-Fluoride [Sodium Fluoride] Itching  . Codeine Nausea And Vomiting  . Fluticasone Other (See Comments)    Nasal ulcer.    . Furosemide Other (See Comments)    Nose bleed  . Lactose Intolerance (Gi) Other (See Comments)    Increased calcium, sneezing, nasal issues  . Losartan Potassium Itching  . Nasonex [Mometasone Furoate] Other (See Comments)    nosebleed  . Quinine     unknown  . Rifampin Other (See Comments)    DOES NOT REMEMBER THIS MEDICATION OR ALLERGY  . Clindamycin/Lincomycin Rash  . Miralax [Polyethylene Glycol] Rash  . Sulfa Antibiotics Rash  . Sulfamethoxazole Rash  . Sulfonamide Derivatives Rash  . Zoledronic Acid Other (See Comments) and Rash    shaking     Current Outpatient Prescriptions  Medication Sig Dispense Refill  . ACCU-CHEK SMARTVIEW test strip Test two times daily 100 each 11  . AMBULATORY NON FORMULARY MEDICATION Medication Name: Needs tubing and suplies for her nebulizer machine. Dx COPD. [facility] 1 vial 0  . bumetanide (BUMEX) 1 MG tablet Take 1 tablet (1 mg total) by mouth 2 (two) times daily. 180 tablet 3  . dicyclomine (BENTYL) 20 MG tablet Take 10-20 mg by mouth every 8 (eight) hours as needed for spasms. Take 1/2-1 tablet by mouth every eights hours as needed for cramps  11  . DULoxetine (CYMBALTA) 30 MG capsule Take 30 mg by mouth daily.     . Fluticasone-Salmeterol (ADVAIR DISKUS) 500-50 MCG/DOSE AEPB Inhale 1 puff into the lungs 2 (two) times daily. 60 each 2  . levothyroxine (SYNTHROID, LEVOTHROID) 75 MCG tablet Take 1 tablet by mouth  daily (Patient taking differently: Take 75 mcg by mouth daily before breakfast. Take 1 tablet by mouth  daily) 90 tablet 3  . loratadine (CLARITIN) 10 MG tablet Take 10 mg by mouth daily as needed for allergies.     . metFORMIN (GLUCOPHAGE) 1000 MG tablet Take 1 tablet (1,000 mg total) by mouth 2 (two) times daily with a meal. TAKE ONE TABLET BY MOUTH TWICE DAILY WITH MEALS (Patient taking differently: Take 1,000 mg by mouth 2 (two) times daily with a meal. ) 60 tablet 2  . mupirocin ointment (BACTROBAN) 2 % Apply 1 application topically daily as needed. nose    . nebivolol (BYSTOLIC) 5 MG tablet Take 1 tablet (5 mg total)  by mouth daily. 90 tablet 3  . oxyCODONE-acetaminophen (PERCOCET) 10-325 MG per tablet Take 1 tablet by mouth every 4 (four) hours as needed for  pain.    . pantoprazole (PROTONIX) 40 MG tablet Take 40 mg by mouth daily.   11  . pravastatin (PRAVACHOL) 40 MG tablet Take 1 tablet by mouth  daily 90 tablet 3  . PROAIR HFA 108 (90 BASE) MCG/ACT inhaler Inhale 2 puffs into the  lungs every 6 hours as  needed (Patient taking differently: Inhale 2 puffs into the  lungs every 6 hours as  needed shortness of breath) 34 g 2  . SINGULAIR 10 MG tablet Take 1 tablet by mouth  daily 90 tablet 0  . SPIRIVA HANDIHALER 18 MCG inhalation capsule Inhale the contents of 1  capsule via HandiHaler  daily 30 capsule 0  . tamoxifen (NOLVADEX) 20  MG tablet Take 1 tablet by mouth  daily 90 tablet 1  . gabapentin (NEURONTIN) 600 MG tablet Take 600 mg by mouth 2 (two) times daily as needed (flare ups).    [name] glucosamine-chondroitin 500-400 MG tablet Take 1 tablet by mouth daily.    . Multiple Vitamins-Minerals (MULTIVITAMIN WITH MINERALS) tablet Take 1 tablet by mouth daily.     No current facility-administered medications for this visit.     Past Medical History  Diagnosis Date  . HTN (hypertension)   . CHF (congestive heart failure)   . LBBB (left bundle branch block)   . Fibromyalgia   . Alkaline phosphatase elevation   . Hypothyroidism   . Closed fracture of unspecified part of radius (alone)   . Depression   . DDD (degenerative disc disease), lumbar   . Carpal tunnel syndrome   . IBS (irritable bowel syndrome)   . Breast cancer     ROS:   All systems reviewed and negative except as noted in the HPI.   Past Surgical History  Procedure Laterality Date  . Pacemaker placement  2009    [facility] cardiology  . Knee surgery    . Foot surgery    . Tubal ligation    . Breast lumpectomy       Family History  Problem Relation Age of Onset  . Heart failure    . Breast cancer    . Hypertension    . Melanoma Mother      History   Social History  . Marital Status: Divorced    Spouse Name: N/A    Number of Children: N/A  . Years of Education: N/A   Occupational History  . Not on file.   Social History Main Topics  . Smoking status: Former Smoker    Quit date: 08/24/1966  . Smokeless tobacco: Not on file  . Alcohol Use: No  . Drug Use: No  . Sexual Activity: Not Currently   Other Topics Concern  . Not on file   Social History Narrative     BP 152/74 mmHg  Pulse 78  Ht 5' 1.75" (1.568 m)  Wt 211 lb 6.4 oz (95.89 kg)  BMI 39.00 kg/m2  Physical Exam:  Well appearing 71 old woman, NAD HEENT: Unremarkable Neck:   7 cm JVD, no thyromegally Lungs:  Clear with no wheezes, rales, or  rhonchi. HEART:  Regular rate rhythm, no murmurs, no rubs, no clicks Abd:  soft, positive bowel sounds, no organomegally, no rebound, no guarding Ext:  2 plus pulses, no edema, no cyanosis, no clubbing Skin:  No rashes no nodules Neuro:  CN II through XII intact, motor grossly intact  DEVICE  Normal device function.  See PaceArt for details. Approx. 1 year from ERI.  Assess/Plan:

## 2014-08-21 NOTE — Assessment & Plan Note (Signed)
She is encouraged to lose weight.

## 2014-08-21 NOTE — Assessment & Plan Note (Signed)
Her BiV device is on active recall. She is device dependent with underlying complete heart block. He device is at risk for sudden and catastrophic failure. I have recommended she undergo removal of the old device and insertion of a new BiV PPM. This will be scheduled ASAP.

## 2014-08-21 NOTE — Patient Instructions (Addendum)
Your physician recommends that you schedule a follow-up appointment in: 7-10 days from 08/23/14 in device clinic for wound check  Your physician recommends that you return for lab work today  Pacemaker Battery Change A pacemaker battery usually lasts 4 to 12 years. Once or twice per year, you will be asked to visit your health care provider to have a full evaluation of your pacemaker. When a battery needs to be replaced, the entire pacemaker is replaced so that you can benefit from new circuitry and any new features that have been added to pacemakers. Most often, this procedure is very simple because the leads are already in place.  There are many things that affect how long a pacemaker battery will last, including:   The age of the pacemaker.   The number of leads (1, 2, or 3).   The pacemaker work load. If the pacemaker is helping the heart more often, the battery will not last as long as it would if the pacemaker did not need to help the heart.   Power (voltage) settings. LET [facility] CARE PROVIDER KNOW ABOUT:   Any allergies you have.   All medicines you are taking, including vitamins, herbs, eye drops, creams, and over-the-counter medicines.   Previous problems you or members of your family have had with the use of anesthetics.   Any blood disorders you have.   Previous surgeries you have had, especially since your last pacemaker placement.   Medical conditions you have.   Possibility of pregnancy, if this applies.  Symptoms of chest pain, trouble breathing, palpitations, light-headedness, or feelings of an abnormal or irregular heartbeat. RISKS AND COMPLICATIONS  Generally, this is a safe procedure. However, as with any procedure, problems can occur and include:   Bleeding.   Bruising of the skin around where the incision was made.   Pain at the incision site.   Pulling apart of the skin at the incision site.   Infection.   Allergic reaction to  anesthetics or other medicines used during the procedure.  People with diabetes may have a temporary increase in their blood sugar after any surgical procedure.  BEFORE THE PROCEDURE   Wash all of the skin around the area of the chest where the pacemaker is located.   Ask your health care provider for help with any medicine adjustments before the pacemaker is replaced.   Do not eat or drink anything after midnight on the night before the procedure or as directed by your health care provider.  Ask your health care provider if you can take a sip of water with any approved medicines the morning of the procedure. PROCEDURE   After giving medicine to numb the skin (local anesthetic), your health care provider will make a cut to reopen the pocket holding the pacemaker.   The old pacemaker will be disconnected from its leads.   The leads will be tested.   If needed, the leads will be replaced. If the leads are functioning properly, the new pacemaker may be connected to the existing leads.  A heart monitor and the pacemaker programmer will be used to make sure that the new pacemaker is working properly.  The incision site will then be closed. A dressing will be placed over the pacemaker site. The dressing will be removed 24-48 hours afterward. AFTER THE PROCEDURE   You will be taken to a recovery area after the new pacemaker implant is completed. Your vital signs such as blood pressure, heart rate, breathing,  and oxygen levels will be monitored.  Your health care provider will tell you when you will need to next test your pacemaker or when to return to the office for follow-up for removal of stitches. Document Released: 11/18/2006 Document Revised: 12/25/2013 Document Reviewed: 02/22/2013 [facility] Patient Information 2015 [place], [place]. This information is not intended to replace advice given to you by your health care provider. Make sure you discuss any questions you have with your  health care provider.

## 2014-08-21 NOTE — Assessment & Plan Note (Signed)
Her blood pressure is elevated but she notes that at home it is well controlled. She is encouraged to lose weight.

## 2014-08-21 NOTE — Assessment & Plan Note (Signed)
Her symptoms remain class 2. No change in medical therapy.

## 2014-08-22 ENCOUNTER — Other Ambulatory Visit: Payer: Self-pay | Admitting: *Deleted

## 2014-08-22 DIAGNOSIS — Z880 Allergy status to penicillin: Secondary | ICD-10-CM | POA: Diagnosis not present

## 2014-08-22 DIAGNOSIS — Z79899 Other long term (current) drug therapy: Secondary | ICD-10-CM | POA: Diagnosis not present

## 2014-08-22 DIAGNOSIS — I5022 Chronic systolic (congestive) heart failure: Secondary | ICD-10-CM | POA: Diagnosis not present

## 2014-08-22 DIAGNOSIS — Z950 Presence of cardiac pacemaker: Secondary | ICD-10-CM

## 2014-08-22 DIAGNOSIS — Z87891 Personal history of nicotine dependence: Secondary | ICD-10-CM | POA: Diagnosis not present

## 2014-08-22 DIAGNOSIS — F329 Major depressive disorder, single episode, unspecified: Secondary | ICD-10-CM | POA: Diagnosis not present

## 2014-08-22 DIAGNOSIS — G4730 Sleep apnea, unspecified: Secondary | ICD-10-CM | POA: Diagnosis not present

## 2014-08-22 DIAGNOSIS — M797 Fibromyalgia: Secondary | ICD-10-CM | POA: Diagnosis not present

## 2014-08-22 DIAGNOSIS — G5600 Carpal tunnel syndrome, unspecified upper limb: Secondary | ICD-10-CM | POA: Diagnosis not present

## 2014-08-22 DIAGNOSIS — I442 Atrioventricular block, complete: Secondary | ICD-10-CM | POA: Diagnosis not present

## 2014-08-22 DIAGNOSIS — Z45010 Encounter for checking and testing of cardiac pacemaker pulse generator [battery]: Secondary | ICD-10-CM | POA: Diagnosis not present

## 2014-08-22 DIAGNOSIS — Z883 Allergy status to other anti-infective agents status: Secondary | ICD-10-CM | POA: Diagnosis not present

## 2014-08-22 DIAGNOSIS — M5136 Other intervertebral disc degeneration, lumbar region: Secondary | ICD-10-CM | POA: Diagnosis not present

## 2014-08-22 DIAGNOSIS — E669 Obesity, unspecified: Secondary | ICD-10-CM | POA: Diagnosis not present

## 2014-08-22 DIAGNOSIS — I10 Essential (primary) hypertension: Secondary | ICD-10-CM | POA: Diagnosis not present

## 2014-08-22 DIAGNOSIS — Z888 Allergy status to other drugs, medicaments and biological substances status: Secondary | ICD-10-CM | POA: Diagnosis not present

## 2014-08-22 DIAGNOSIS — Z853 Personal history of malignant neoplasm of breast: Secondary | ICD-10-CM | POA: Diagnosis not present

## 2014-08-22 DIAGNOSIS — K589 Irritable bowel syndrome without diarrhea: Secondary | ICD-10-CM | POA: Diagnosis not present

## 2014-08-22 DIAGNOSIS — Z6838 Body mass index (BMI) 38.0-38.9, adult: Secondary | ICD-10-CM | POA: Diagnosis not present

## 2014-08-22 DIAGNOSIS — E039 Hypothyroidism, unspecified: Secondary | ICD-10-CM | POA: Diagnosis not present

## 2014-08-22 DIAGNOSIS — Z8249 Family history of ischemic heart disease and other diseases of the circulatory system: Secondary | ICD-10-CM | POA: Diagnosis not present

## 2014-08-22 MED ORDER — CHLORHEXIDINE GLUCONATE 4 % EX LIQD
60.0000 mL | Freq: Once | CUTANEOUS | Status: DC
  Filled 2014-08-22: qty 60

## 2014-08-22 MED ORDER — SODIUM CHLORIDE 0.9 % IV SOLN: 250.0000 mL | INTRAVENOUS | Status: DC

## 2014-08-22 MED ORDER — SODIUM CHLORIDE 0.9 % IV SOLN
INTRAVENOUS | Status: DC
  Administered 2014-08-23: 07:00:00 via INTRAVENOUS

## 2014-08-22 MED ORDER — SODIUM CHLORIDE 0.9 % IJ SOLN: 3.0000 mL | INTRAMUSCULAR | Status: DC | PRN

## 2014-08-22 MED ORDER — VANCOMYCIN HCL IN DEXTROSE 1-5 GM/200ML-% IV SOLN
1000.0000 mg | INTRAVENOUS | Status: DC
  Filled 2014-08-22: qty 200

## 2014-08-22 MED ORDER — SODIUM CHLORIDE 0.9 % IR SOLN
80.0000 mg | Status: DC
  Filled 2014-08-22: qty 2

## 2014-08-22 MED ORDER — SODIUM CHLORIDE 0.9 % IJ SOLN: 3.0000 mL | Freq: Two times a day (BID) | INTRAMUSCULAR | Status: DC

## 2014-08-23 ENCOUNTER — Ambulatory Visit (HOSPITAL_COMMUNITY)
Admission: RE | Admit: 2014-08-23 | Discharge: 2014-08-23 | Disposition: A | Discharge status: Home / Self Care | Payer: Medicare Other | Source: Ambulatory Visit | Attending: Internal Medicine | Admitting: Internal Medicine

## 2014-08-23 ENCOUNTER — Encounter (HOSPITAL_COMMUNITY)
Admission: RE | Disposition: A | Discharge status: Home / Self Care | Payer: Self-pay | Source: Ambulatory Visit | Attending: Internal Medicine

## 2014-08-23 ENCOUNTER — Encounter (HOSPITAL_COMMUNITY): Payer: Self-pay | Admitting: Internal Medicine

## 2014-08-23 DIAGNOSIS — Z6838 Body mass index (BMI) 38.0-38.9, adult: Secondary | ICD-10-CM | POA: Insufficient documentation

## 2014-08-23 DIAGNOSIS — Z45010 Encounter for checking and testing of cardiac pacemaker pulse generator [battery]: Secondary | ICD-10-CM | POA: Diagnosis not present

## 2014-08-23 DIAGNOSIS — F329 Major depressive disorder, single episode, unspecified: Secondary | ICD-10-CM | POA: Insufficient documentation

## 2014-08-23 DIAGNOSIS — M797 Fibromyalgia: Secondary | ICD-10-CM | POA: Insufficient documentation

## 2014-08-23 DIAGNOSIS — Z79899 Other long term (current) drug therapy: Secondary | ICD-10-CM | POA: Insufficient documentation

## 2014-08-23 DIAGNOSIS — Z888 Allergy status to other drugs, medicaments and biological substances status: Secondary | ICD-10-CM | POA: Insufficient documentation

## 2014-08-23 DIAGNOSIS — G5600 Carpal tunnel syndrome, unspecified upper limb: Secondary | ICD-10-CM | POA: Insufficient documentation

## 2014-08-23 DIAGNOSIS — I442 Atrioventricular block, complete: Secondary | ICD-10-CM

## 2014-08-23 DIAGNOSIS — Z883 Allergy status to other anti-infective agents status: Secondary | ICD-10-CM | POA: Insufficient documentation

## 2014-08-23 DIAGNOSIS — Z880 Allergy status to penicillin: Secondary | ICD-10-CM | POA: Insufficient documentation

## 2014-08-23 DIAGNOSIS — K589 Irritable bowel syndrome without diarrhea: Secondary | ICD-10-CM | POA: Insufficient documentation

## 2014-08-23 DIAGNOSIS — M5136 Other intervertebral disc degeneration, lumbar region: Secondary | ICD-10-CM | POA: Insufficient documentation

## 2014-08-23 DIAGNOSIS — Z8249 Family history of ischemic heart disease and other diseases of the circulatory system: Secondary | ICD-10-CM | POA: Insufficient documentation

## 2014-08-23 DIAGNOSIS — Z853 Personal history of malignant neoplasm of breast: Secondary | ICD-10-CM | POA: Insufficient documentation

## 2014-08-23 DIAGNOSIS — E669 Obesity, unspecified: Secondary | ICD-10-CM | POA: Insufficient documentation

## 2014-08-23 DIAGNOSIS — I10 Essential (primary) hypertension: Secondary | ICD-10-CM | POA: Insufficient documentation

## 2014-08-23 DIAGNOSIS — I5022 Chronic systolic (congestive) heart failure: Secondary | ICD-10-CM | POA: Insufficient documentation

## 2014-08-23 DIAGNOSIS — Z950 Presence of cardiac pacemaker: Secondary | ICD-10-CM

## 2014-08-23 DIAGNOSIS — Z87891 Personal history of nicotine dependence: Secondary | ICD-10-CM | POA: Insufficient documentation

## 2014-08-23 DIAGNOSIS — G4730 Sleep apnea, unspecified: Secondary | ICD-10-CM | POA: Insufficient documentation

## 2014-08-23 DIAGNOSIS — E039 Hypothyroidism, unspecified: Secondary | ICD-10-CM | POA: Insufficient documentation

## 2014-08-23 HISTORY — PX: PACEMAKER GENERATOR CHANGE: SHX5481

## 2014-08-23 LAB — BASIC METABOLIC PANEL
ANION GAP: 5 (ref 5–15)
BUN: 9 mg/dL (ref 6–23)
CALCIUM: 9.6 mg/dL (ref 8.4–10.5)
CHLORIDE: 109 meq/L (ref 96–112)
CO2: 28 mmol/L (ref 19–32)
CREATININE: 0.72 mg/dL (ref 0.50–1.10)
GFR calc Af Amer: >90 mL/min (ref >90)
GFR calc non Af Amer: 88 mL/min — ABNORMAL LOW (ref >90)
Glucose, Bld: 123 mg/dL — ABNORMAL HIGH (ref 70–99)
Potassium: 3.5 mmol/L (ref 3.5–5.1)
SODIUM: 142 mmol/L (ref 135–145)

## 2014-08-23 LAB — SURGICAL PCR SCREEN
MRSA, PCR: POSITIVE — AB
STAPHYLOCOCCUS AUREUS: POSITIVE — AB

## 2014-08-23 LAB — GLUCOSE, CAPILLARY: Glucose-Capillary: 113 mg/dL — ABNORMAL HIGH (ref 70–99)

## 2014-08-23 SURGERY — PACEMAKER GENERATOR CHANGE
Anesthesia: LOCAL

## 2014-08-23 MED ORDER — MUPIROCIN 2 % EX OINT
1.0000 "application " | TOPICAL_OINTMENT | Freq: Once | CUTANEOUS | Status: AC
  Administered 2014-08-23: 1 via TOPICAL

## 2014-08-23 MED ORDER — FENTANYL CITRATE 0.05 MG/ML IJ SOLN
INTRAMUSCULAR | Status: AC
  Filled 2014-08-23: qty 2

## 2014-08-23 MED ORDER — ONDANSETRON HCL 4 MG/2ML IJ SOLN: 4.0000 mg | Freq: Four times a day (QID) | INTRAMUSCULAR | Status: DC | PRN

## 2014-08-23 MED ORDER — LIDOCAINE HCL (PF) 1 % IJ SOLN
INTRAMUSCULAR | Status: AC
  Filled 2014-08-23: qty 30

## 2014-08-23 MED ORDER — MIDAZOLAM HCL 5 MG/5ML IJ SOLN
INTRAMUSCULAR | Status: AC
  Filled 2014-08-23: qty 5

## 2014-08-23 MED ORDER — ACETAMINOPHEN 325 MG PO TABS
325.0000 mg | ORAL_TABLET | ORAL | Status: DC | PRN
Start: 2014-08-23 — End: 2014-08-23
  Filled 2014-08-23: qty 2

## 2014-08-23 MED ORDER — MUPIROCIN 2 % EX OINT
TOPICAL_OINTMENT | CUTANEOUS | Status: AC
  Filled 2014-08-23: qty 22

## 2014-08-23 NOTE — Discharge Instructions (Signed)
Pacemaker Battery Change A pacemaker battery usually lasts 4 to 12 years. Once or twice per year, you will be asked to visit your health care provider to have a full evaluation of your pacemaker. When a battery needs to be replaced, the entire pacemaker is replaced so that you can benefit from new circuitry and any new features that have been added to pacemakers. Most often, this procedure is very simple because the leads are already in place.  There are many things that affect how long a pacemaker battery will last, including:   The age of the pacemaker.   The number of leads (1, 2, or 3).   The pacemaker work load. If the pacemaker is helping the heart more often, the battery will not last as long as it would if the pacemaker did not need to help the heart.   Power (voltage) settings. LET [facility] CARE PROVIDER KNOW ABOUT:   Any allergies you have.   All medicines you are taking, including vitamins, herbs, eye drops, creams, and over-the-counter medicines.   Previous problems you or members of your family have had with the use of anesthetics.   Any blood disorders you have.   Previous surgeries you have had, especially since your last pacemaker placement.   Medical conditions you have.   Possibility of pregnancy, if this applies.  Symptoms of chest pain, trouble breathing, palpitations, light-headedness, or feelings of an abnormal or irregular heartbeat. RISKS AND COMPLICATIONS  Generally, this is a safe procedure. However, as with any procedure, problems can occur and include:   Bleeding.   Bruising of the skin around where the incision was made.   Pain at the incision site.   Pulling apart of the skin at the incision site.   Infection.   Allergic reaction to anesthetics or other medicines used during the procedure.  People with diabetes may have a temporary increase in their blood sugar after any surgical procedure.  BEFORE THE PROCEDURE   Wash all  of the skin around the area of the chest where the pacemaker is located.   Ask your health care provider for help with any medicine adjustments before the pacemaker is replaced.   Do not eat or drink anything after midnight on the night before the procedure or as directed by your health care provider.  Ask your health care provider if you can take a sip of water with any approved medicines the morning of the procedure. PROCEDURE   After giving medicine to numb the skin (local anesthetic), your health care provider will make a cut to reopen the pocket holding the pacemaker.   The old pacemaker will be disconnected from its leads.   The leads will be tested.   If needed, the leads will be replaced. If the leads are functioning properly, the new pacemaker may be connected to the existing leads.  A heart monitor and the pacemaker programmer will be used to make sure that the new pacemaker is working properly.  The incision site will then be closed. A dressing will be placed over the pacemaker site. The dressing will be removed 24-48 hours afterward. AFTER THE PROCEDURE   You will be taken to a recovery area after the new pacemaker implant is completed. Your vital signs such as blood pressure, heart rate, breathing, and oxygen levels will be monitored.  Your health care provider will tell you when you will need to next test your pacemaker or when to return to the office for follow-up  for removal of stitches. Document Released: 11/18/2006 Document Revised: 12/25/2013 Document Reviewed: 02/22/2013 [facility] Patient Information 2015 [place], [place]. This information is not intended to replace advice given to you by your health care provider. Make sure you discuss any questions you have with your health care provider.

## 2014-08-23 NOTE — CV Procedure (Signed)
EP Procedure Note  Procedure: Removal of a BiV PM which had reached ERI and insertion of a new BiV PM in a patient with CHB and chronic systolic heart failure  Pre-procedure diagnosis: see above  Post-procedure diagnosis: See above  Description of the procedure: After informed consent was obtained, the patient was taken to the EP lab in the fasting state. After the usual preparation and draping, IV versed and fentanyl were used for sedation. 30 cc of lidocaine was infiltrated into the left infraclavicular region. A 5 cm incision was carried out and electrocautery was utilized to dissect down to the facial plane. The PM pocket was entered and the old PPM was removed with gentle traction. The RV, LV and RA leads were found to be working satisfactorily. The new Medtronic BiV PM, serial [id number] was connected to the old leads and placed back into the subcutaneous pocket. The pocket was irrigated with anti-biotic irrigation. The incision was closed with 2 layers of vicryl suture. The patient was returned to her room in satisfactory condition.   Complications: none immediately  EBL: less than 5 cc.  Conclusion: successful removal of an old BiV PPM which had reached ERI and insertion of a new BiV PM in a patient with chronic systolic heart failure and complete heart block.  Mikle Bosworth.D.

## 2014-08-23 NOTE — H&P (View-Only) (Signed)
HPI Tanya Hogan returns today for followup. She is pleasant 63 year old woman with a history of obesity, hypertension, sleep apnea, chronic systolic heart failure, status post biventricular pacemaker insertion. She denies chest pain, shortness of breath, or peripheral edema. No syncope. She is been bothered by falling and she broke her leg. She was unable take ACE inhibitors or ARB's secondary to hyperkalemia.  She denies chest pain. She has class II heart failure symptoms. She denies peripheral edema. No syncope or palpitations.  The patient was found to have an active recall PM and is PM dependent. She less than a year of battery longevity and presents today to discuss the pro's and con's of premature removal of her device and insertion of a new BiV PPM. Allergies  Allergen Reactions  . Penicillins Anaphylaxis  . Accupril [Quinapril Hcl] Other (See Comments)    Hyperkalemia  . Aspirin Other (See Comments)    Pt states "burns holes in stomach"  . Baking Soda-Fluoride [Sodium Fluoride] Itching  . Codeine Nausea And Vomiting  . Fluticasone Other (See Comments)    Nasal ulcer.    . Furosemide Other (See Comments)    Nose bleed  . Lactose Intolerance (Gi) Other (See Comments)    Increased calcium, sneezing, nasal issues  . Losartan Potassium Itching  . Nasonex [Mometasone Furoate] Other (See Comments)    nosebleed  . Quinine     unknown  . Rifampin Other (See Comments)    DOES NOT REMEMBER THIS MEDICATION OR ALLERGY  . Clindamycin/Lincomycin Rash  . Miralax [Polyethylene Glycol] Rash  . Sulfa Antibiotics Rash  . Sulfamethoxazole Rash  . Sulfonamide Derivatives Rash  . Zoledronic Acid Other (See Comments) and Rash    shaking     Current Outpatient Prescriptions  Medication Sig Dispense Refill  . ACCU-CHEK SMARTVIEW test strip Test two times daily 100 each 11  . AMBULATORY NON FORMULARY MEDICATION Medication Name: Needs tubing and suplies for her nebulizer machine. Dx COPD. [facility] 1 vial 0  . bumetanide (BUMEX) 1 MG tablet Take 1 tablet (1 mg total) by mouth 2 (two) times daily. 180 tablet 3  . dicyclomine (BENTYL) 20 MG tablet Take 10-20 mg by mouth every 8 (eight) hours as needed for spasms. Take 1/2-1 tablet by mouth every eights hours as needed for cramps  11  . DULoxetine (CYMBALTA) 30 MG capsule Take 30 mg by mouth daily.     . Fluticasone-Salmeterol (ADVAIR DISKUS) 500-50 MCG/DOSE AEPB Inhale 1 puff into the lungs 2 (two) times daily. 60 each 2  . levothyroxine (SYNTHROID, LEVOTHROID) 75 MCG tablet Take 1 tablet by mouth  daily (Patient taking differently: Take 75 mcg by mouth daily before breakfast. Take 1 tablet by mouth  daily) 90 tablet 3  . loratadine (CLARITIN) 10 MG tablet Take 10 mg by mouth daily as needed for allergies.     . metFORMIN (GLUCOPHAGE) 1000 MG tablet Take 1 tablet (1,000 mg total) by mouth 2 (two) times daily with a meal. TAKE ONE TABLET BY MOUTH TWICE DAILY WITH MEALS (Patient taking differently: Take 1,000 mg by mouth 2 (two) times daily with a meal. ) 60 tablet 2  . mupirocin ointment (BACTROBAN) 2 % Apply 1 application topically daily as needed. nose    . nebivolol (BYSTOLIC) 5 MG tablet Take 1 tablet (5 mg total)  by mouth daily. 90 tablet 3  . oxyCODONE-acetaminophen (PERCOCET) 10-325 MG per tablet Take 1 tablet by mouth every 4 (four) hours as needed for  pain.    . pantoprazole (PROTONIX) 40 MG tablet Take 40 mg by mouth daily.   11  . pravastatin (PRAVACHOL) 40 MG tablet Take 1 tablet by mouth  daily 90 tablet 3  . PROAIR HFA 108 (90 BASE) MCG/ACT inhaler Inhale 2 puffs into the  lungs every 6 hours as  needed (Patient taking differently: Inhale 2 puffs into the  lungs every 6 hours as  needed shortness of breath) 34 g 2  . SINGULAIR 10 MG tablet Take 1 tablet by mouth  daily 90 tablet 0  . SPIRIVA HANDIHALER 18 MCG inhalation capsule Inhale the contents of 1  capsule via HandiHaler  daily 30 capsule 0  . tamoxifen (NOLVADEX) 20  MG tablet Take 1 tablet by mouth  daily 90 tablet 1  . gabapentin (NEURONTIN) 600 MG tablet Take 600 mg by mouth 2 (two) times daily as needed (flare ups).    [name] glucosamine-chondroitin 500-400 MG tablet Take 1 tablet by mouth daily.    . Multiple Vitamins-Minerals (MULTIVITAMIN WITH MINERALS) tablet Take 1 tablet by mouth daily.     No current facility-administered medications for this visit.     Past Medical History  Diagnosis Date  . HTN (hypertension)   . CHF (congestive heart failure)   . LBBB (left bundle branch block)   . Fibromyalgia   . Alkaline phosphatase elevation   . Hypothyroidism   . Closed fracture of unspecified part of radius (alone)   . Depression   . DDD (degenerative disc disease), lumbar   . Carpal tunnel syndrome   . IBS (irritable bowel syndrome)   . Breast cancer     ROS:   All systems reviewed and negative except as noted in the HPI.   Past Surgical History  Procedure Laterality Date  . Pacemaker placement  2009    [facility] cardiology  . Knee surgery    . Foot surgery    . Tubal ligation    . Breast lumpectomy       Family History  Problem Relation Age of Onset  . Heart failure    . Breast cancer    . Hypertension    . Melanoma Mother      History   Social History  . Marital Status: Divorced    Spouse Name: N/A    Number of Children: N/A  . Years of Education: N/A   Occupational History  . Not on file.   Social History Main Topics  . Smoking status: Former Smoker    Quit date: 08/24/1966  . Smokeless tobacco: Not on file  . Alcohol Use: No  . Drug Use: No  . Sexual Activity: Not Currently   Other Topics Concern  . Not on file   Social History Narrative     BP 152/74 mmHg  Pulse 78  Ht 5' 1.75" (1.568 m)  Wt 211 lb 6.4 oz (95.89 kg)  BMI 39.00 kg/m2  Physical Exam:  Well appearing 69 old woman, NAD HEENT: Unremarkable Neck:   7 cm JVD, no thyromegally Lungs:  Clear with no wheezes, rales, or  rhonchi. HEART:  Regular rate rhythm, no murmurs, no rubs, no clicks Abd:  soft, positive bowel sounds, no organomegally, no rebound, no guarding Ext:  2 plus pulses, no edema, no cyanosis, no clubbing Skin:  No rashes no nodules Neuro:  CN II through XII intact, motor grossly intact  DEVICE  Normal device function.  See PaceArt for details. Approx. 1 year from ERI.  Assess/Plan:

## 2014-08-23 NOTE — Interval H&P Note (Signed)
History and Physical Interval Note:  08/23/2014 7:33 AM  Tanya Hogan  has presented today for surgery, with the diagnosis of battery recall  The various methods of treatment have been discussed with the patient and family. After consideration of risks, benefits and other options for treatment, the patient has consented to  Procedure(s): PACEMAKER GENERATOR CHANGE (N/A) as a surgical intervention .  The patient's history has been reviewed, patient examined, no change in status, stable for surgery.  I have reviewed the patient's chart and labs.  Questions were answered to the patient's satisfaction.     Mikle Bosworth.D.

## 2014-08-27 ENCOUNTER — Telehealth: Payer: Self-pay | Admitting: Internal Medicine

## 2014-08-27 NOTE — Telephone Encounter (Signed)
New Message  Pt had pcmkr put in 12/31 and had a MRSA nasal swab and wondered if that came back neg. Please call back and discuss.

## 2014-08-27 NOTE — Telephone Encounter (Signed)
Informed patient she had a positive MRSA test.  She thought this might be why she had so much phlegm and couldn't get it "up". Advised to contact PCP that OTCs aren't working and for further advisement, and explained this is not related. She verbalized understanding.

## 2014-09-03 ENCOUNTER — Ambulatory Visit (INDEPENDENT_AMBULATORY_CARE_PROVIDER_SITE_OTHER): Payer: Medicare Other | Admitting: *Deleted

## 2014-09-03 ENCOUNTER — Encounter: Payer: Self-pay | Admitting: Internal Medicine

## 2014-09-03 DIAGNOSIS — I5022 Chronic systolic (congestive) heart failure: Secondary | ICD-10-CM

## 2014-09-03 DIAGNOSIS — I442 Atrioventricular block, complete: Secondary | ICD-10-CM | POA: Diagnosis not present

## 2014-09-03 LAB — MDC_IDC_ENUM_SESS_TYPE_INCLINIC
Battery Voltage: 3.06 V
Brady Statistic AP VP Percent: 33.78 %
Brady Statistic AS VP Percent: 65.48 %
Brady Statistic RA Percent Paced: 33.81 %
Lead Channel Impedance Value: 4047 Ohm
Lead Channel Impedance Value: 4047 Ohm
Lead Channel Impedance Value: 418 Ohm
Lead Channel Pacing Threshold Amplitude: 0.5 V
Lead Channel Pacing Threshold Amplitude: 1.25 V
Lead Channel Pacing Threshold Amplitude: 1.5 V
Lead Channel Pacing Threshold Pulse Width: 0.4 ms
Lead Channel Pacing Threshold Pulse Width: 0.4 ms
Lead Channel Pacing Threshold Pulse Width: 0.8 ms
Lead Channel Sensing Intrinsic Amplitude: 2.25 mV
Lead Channel Setting Pacing Amplitude: 2 V
Lead Channel Setting Pacing Amplitude: 2.25 V
Lead Channel Setting Pacing Amplitude: 3 V
Lead Channel Setting Pacing Pulse Width: 0.4 ms
MDC IDC MSMT LEADCHNL LV IMPEDANCE VALUE: 1064 Ohm
MDC IDC MSMT LEADCHNL LV IMPEDANCE VALUE: 1121 Ohm
MDC IDC MSMT LEADCHNL LV IMPEDANCE VALUE: 4047 Ohm
MDC IDC MSMT LEADCHNL RA IMPEDANCE VALUE: 399 Ohm
MDC IDC MSMT LEADCHNL RA SENSING INTR AMPL: 1.375 mV
MDC IDC MSMT LEADCHNL RV IMPEDANCE VALUE: 380 Ohm
MDC IDC MSMT LEADCHNL RV IMPEDANCE VALUE: 418 Ohm
MDC IDC MSMT LEADCHNL RV SENSING INTR AMPL: 4.625 mV
MDC IDC SESS DTM: 20160111123619
MDC IDC SET LEADCHNL LV PACING PULSEWIDTH: 0.8 ms
MDC IDC SET LEADCHNL RV SENSING SENSITIVITY: 4 mV
MDC IDC SET ZONE DETECTION INTERVAL: 400 ms
MDC IDC SET ZONE DETECTION INTERVAL: 400 ms
MDC IDC STAT BRADY AP VS PERCENT: 0.02 %
MDC IDC STAT BRADY AS VS PERCENT: 0.71 %
MDC IDC STAT BRADY RV PERCENT PACED: 99.26 %

## 2014-09-03 NOTE — Progress Notes (Signed)
Wound check appointment. Steri-strips removed. Wound without redness or edema. Incision edges approximated, wound well healed. Normal device function. Thresholds, sensing, and impedances consistent with implant measurements. Device programmed at 3.5V/auto capture programmed on for extra safety margin until 3 month visit. Histogram distribution appropriate for patient and level of activity. No mode switches.  2 high ventricular rates noted 4 beats. Patient educated about wound care, arm mobility, lifting restrictions. ROV in 3 months with implanting physician.

## 2014-09-07 ENCOUNTER — Encounter: Payer: Self-pay | Admitting: Internal Medicine

## 2014-09-20 DIAGNOSIS — J3489 Other specified disorders of nose and nasal sinuses: Secondary | ICD-10-CM | POA: Diagnosis not present

## 2014-09-20 DIAGNOSIS — M069 Rheumatoid arthritis, unspecified: Secondary | ICD-10-CM | POA: Diagnosis not present

## 2014-09-26 ENCOUNTER — Ambulatory Visit: Payer: Medicare Other | Admitting: Family Medicine

## 2014-09-26 DIAGNOSIS — R748 Abnormal levels of other serum enzymes: Secondary | ICD-10-CM | POA: Diagnosis not present

## 2014-09-26 DIAGNOSIS — M8580 Other specified disorders of bone density and structure, unspecified site: Secondary | ICD-10-CM | POA: Diagnosis not present

## 2014-09-26 DIAGNOSIS — M797 Fibromyalgia: Secondary | ICD-10-CM | POA: Diagnosis not present

## 2014-09-26 DIAGNOSIS — D860 Sarcoidosis of lung: Secondary | ICD-10-CM | POA: Diagnosis not present

## 2014-10-02 DIAGNOSIS — R05 Cough: Secondary | ICD-10-CM | POA: Diagnosis not present

## 2014-10-02 DIAGNOSIS — J4540 Moderate persistent asthma, uncomplicated: Secondary | ICD-10-CM | POA: Diagnosis not present

## 2014-10-02 DIAGNOSIS — G4733 Obstructive sleep apnea (adult) (pediatric): Secondary | ICD-10-CM | POA: Diagnosis not present

## 2014-10-10 DIAGNOSIS — R748 Abnormal levels of other serum enzymes: Secondary | ICD-10-CM | POA: Diagnosis not present

## 2014-10-10 DIAGNOSIS — M797 Fibromyalgia: Secondary | ICD-10-CM | POA: Diagnosis not present

## 2014-10-10 DIAGNOSIS — M79672 Pain in left foot: Secondary | ICD-10-CM | POA: Diagnosis not present

## 2014-10-10 DIAGNOSIS — R32 Unspecified urinary incontinence: Secondary | ICD-10-CM | POA: Diagnosis not present

## 2014-10-10 DIAGNOSIS — M159 Polyosteoarthritis, unspecified: Secondary | ICD-10-CM | POA: Diagnosis not present

## 2014-10-10 DIAGNOSIS — G4733 Obstructive sleep apnea (adult) (pediatric): Secondary | ICD-10-CM | POA: Diagnosis not present

## 2014-10-10 DIAGNOSIS — M79671 Pain in right foot: Secondary | ICD-10-CM | POA: Diagnosis not present

## 2014-10-29 ENCOUNTER — Other Ambulatory Visit: Payer: Self-pay

## 2014-10-29 DIAGNOSIS — C50919 Malignant neoplasm of unspecified site of unspecified female breast: Secondary | ICD-10-CM

## 2014-10-30 ENCOUNTER — Other Ambulatory Visit (HOSPITAL_BASED_OUTPATIENT_CLINIC_OR_DEPARTMENT_OTHER): Payer: Medicare Other

## 2014-10-30 ENCOUNTER — Telehealth: Payer: Self-pay | Admitting: Hematology and Oncology

## 2014-10-30 ENCOUNTER — Ambulatory Visit (HOSPITAL_BASED_OUTPATIENT_CLINIC_OR_DEPARTMENT_OTHER): Payer: Medicare Other | Admitting: Hematology and Oncology

## 2014-10-30 VITALS — BP 131/68 | HR 95 | Temp 97.8°F | Resp 20 | Ht 61.75 in | Wt 211.2 lb

## 2014-10-30 DIAGNOSIS — N958 Other specified menopausal and perimenopausal disorders: Secondary | ICD-10-CM

## 2014-10-30 DIAGNOSIS — C50912 Malignant neoplasm of unspecified site of left female breast: Secondary | ICD-10-CM | POA: Diagnosis not present

## 2014-10-30 DIAGNOSIS — C50919 Malignant neoplasm of unspecified site of unspecified female breast: Secondary | ICD-10-CM

## 2014-10-30 DIAGNOSIS — Z170 Estrogen receptor positive status [ER+]: Secondary | ICD-10-CM | POA: Diagnosis not present

## 2014-10-30 LAB — CBC WITH DIFFERENTIAL/PLATELET
BASO%: 0.8 % (ref 0.0–2.0)
Basophils Absolute: 0 10*3/uL (ref 0.0–0.1)
EOS%: 1.4 % (ref 0.0–7.0)
Eosinophils Absolute: 0.1 10*3/uL (ref 0.0–0.5)
HEMATOCRIT: 39.9 % (ref 34.8–46.6)
HGB: 12.5 g/dL (ref 11.6–15.9)
LYMPH%: 36 % (ref 14.0–49.7)
MCH: 28.3 pg (ref 25.1–34.0)
MCHC: 31.3 g/dL — AB (ref 31.5–36.0)
MCV: 90.5 fL (ref 79.5–101.0)
MONO#: 0.3 10*3/uL (ref 0.1–0.9)
MONO%: 6.2 % (ref 0.0–14.0)
NEUT#: 2.8 10*3/uL (ref 1.5–6.5)
NEUT%: 55.6 % (ref 38.4–76.8)
PLATELETS: 259 10*3/uL (ref 145–400)
RBC: 4.41 10*6/uL (ref 3.70–5.45)
RDW: 14.9 % — ABNORMAL HIGH (ref 11.2–14.5)
WBC: 5 10*3/uL (ref 3.9–10.3)
lymph#: 1.8 10*3/uL (ref 0.9–3.3)

## 2014-10-30 LAB — COMPREHENSIVE METABOLIC PANEL (CC13)
ALT: 43 U/L (ref 0–55)
ANION GAP: 11 meq/L (ref 3–11)
AST: 56 U/L — AB (ref 5–34)
Albumin: 3.5 g/dL (ref 3.5–5.0)
Alkaline Phosphatase: 97 U/L (ref 40–150)
BUN: 11.8 mg/dL (ref 7.0–26.0)
CALCIUM: 9.1 mg/dL (ref 8.4–10.4)
CHLORIDE: 105 meq/L (ref 98–109)
CO2: 25 meq/L (ref 22–29)
CREATININE: 0.8 mg/dL (ref 0.6–1.1)
EGFR: 78 mL/min/{1.73_m2} — AB (ref >90)
Glucose: 204 mg/dl — ABNORMAL HIGH (ref 70–140)
Potassium: 4.5 mEq/L (ref 3.5–5.1)
SODIUM: 142 meq/L (ref 136–145)
Total Bilirubin: 0.36 mg/dL (ref 0.20–1.20)
Total Protein: 6.5 g/dL (ref 6.4–8.3)

## 2014-10-30 NOTE — Progress Notes (Signed)
Patient Care Team: Hali Marry, MD as PCP - General (Family Medicine) Consuela Mimes, MD (Hematology and Oncology) Lelon Perla, MD as Attending Physician (Cardiology) Evans Lance, MD (Cardiology) Jacques Navy, MD (Neurology) Dr. Caprice Kluver (Otolaryngology) Amada Kingfisher, MD as Consulting Physician (Hematology and Oncology)  DIAGNOSIS: No matching staging information was found for the patient.  SUMMARY OF ONCOLOGIC HISTORY:   Breast cancer, left breast   04/22/2010 Surgery Left breast lumpectomy: 0.5 cm invasive ductal carcinoma ER positive PR positive HER-2 negative, T1 N0 M0 stage IA    Radiation Therapy Partial breast radiation   07/01/2010 -  Anti-estrogen oral therapy Tamoxifen 20 mg daily 5 years    CHIEF COMPLIANT: Follow-up on tamoxifen  INTERVAL HISTORY: Tanya Hogan is a 64 year old lady with above-mentioned history of left breast cancer treated with surgery and radiation and has been on tamoxifen for the past 4 and half years. She has profound hot flashes and she is planning on stopping tamoxifen after 5 years of treatment which will be end of September 2016. She reports no new problems with her breasts. Denies anything lumps or nodules.  REVIEW OF SYSTEMS:   Constitutional: Denies fevers, chills or abnormal weight loss Eyes: Denies blurriness of vision Ears, nose, mouth, throat, and face: Denies mucositis or sore throat Respiratory: Denies cough, dyspnea or wheezes Cardiovascular: Denies palpitation, chest discomfort or lower extremity swelling Gastrointestinal:  Denies nausea, heartburn or change in bowel habits Skin: Denies abnormal skin rashes Lymphatics: Denies new lymphadenopathy or easy bruising Neurological:Denies numbness, tingling or new weaknesses Behavioral/Psych: Mood is stable, no new changes  Breast:  denies any pain or lumps or nodules in either breasts All other systems were reviewed with the patient and are negative.  I have reviewed  the past medical history, past surgical history, social history and family history with the patient and they are unchanged from previous note.  ALLERGIES:  is allergic to penicillins; accupril; aspirin; baking soda-fluoride; codeine; fluticasone; furosemide; lactose intolerance (gi); losartan potassium; nasonex; quinine; rifampin; clindamycin/lincomycin; miralax; sulfa antibiotics; sulfamethoxazole; sulfonamide derivatives; and zoledronic acid.  MEDICATIONS:  Current Outpatient Prescriptions  Medication Sig Dispense Refill  . ACCU-CHEK SMARTVIEW test strip Test two times daily 100 each 11  . acetaminophen (TYLENOL) 325 MG tablet Take 650 mg by mouth.    . AMBULATORY NON FORMULARY MEDICATION Medication Name: Needs tubing and suplies for her nebulizer machine. Dx COPD. [facility] 1 vial 0  . bumetanide (BUMEX) 1 MG tablet Take 1 tablet (1 mg total) by mouth 2 (two) times daily. 180 tablet 3  . dicyclomine (BENTYL) 20 MG tablet Take 10-20 mg by mouth every 8 (eight) hours as needed for spasms. Take 1/2-1 tablet by mouth every eights hours as needed for cramps  11  . diltiazem (CARDIZEM) 120 MG tablet Take 120 mg by mouth.    . DULoxetine (CYMBALTA) 30 MG capsule Take 30 mg by mouth daily.     . fluconazole (DIFLUCAN) 150 MG tablet   0  . gabapentin (NEURONTIN) 600 MG tablet Take 600 mg by mouth 2 (two) times daily as needed (flare ups).    [name] glucosamine-chondroitin 500-400 MG tablet Take 1 tablet by mouth daily.    [name] levothyroxine (SYNTHROID, LEVOTHROID) 75 MCG tablet Take 1 tablet by mouth  daily (Patient taking differently: Take 75 mcg by mouth daily before breakfast. Take 1 tablet by mouth  daily) 90 tablet 3  . loratadine (CLARITIN) 10 MG tablet Take 10 mg by  mouth daily as needed for allergies.     . metFORMIN (GLUCOPHAGE) 1000 MG tablet Take 1 tablet (1,000 mg total) by mouth 2 (two) times daily with a meal. TAKE ONE TABLET BY MOUTH TWICE DAILY WITH MEALS (Patient taking differently:  Take 1,000 mg by mouth 2 (two) times daily with a meal. ) 60 tablet 2  . Misc Natural Products (GLUCOSAMINE CHONDROITIN ADV) TABS Frequency:daily   Dosage:0.0     Instructions:Glucosamine Chondroitin Complx ( CAPS, 2 Oral daily)  Note:    . Multiple Vitamin (MULTI-VITAMINS) TABS Frequency:daily   Dosage:0.0     Instructions:Multiple Vitamins ( TABS, 1 Oral daily)  Note:    . Multiple Vitamins-Minerals (MULTIVITAMIN WITH MINERALS) tablet Take 1 tablet by mouth daily.    . mupirocin cream (BACTROBAN) 2 % Apply topically.    . mupirocin ointment (BACTROBAN) 2 % Apply 1 application topically 2 (two) times daily. nose    . nebivolol (BYSTOLIC) 5 MG tablet Take 1 tablet (5 mg total)  by mouth daily. 90 tablet 3  . pantoprazole (PROTONIX) 40 MG tablet Take 40 mg by mouth daily.   11  . pravastatin (PRAVACHOL) 40 MG tablet Take 1 tablet by mouth  daily 90 tablet 3  . PROAIR HFA 108 (90 BASE) MCG/ACT inhaler Inhale 2 puffs into the  lungs every 6 hours as  needed (Patient taking differently: Inhale 2 puffs into the  lungs every 6 hours as  needed shortness of breath) 34 g 2  . SINGULAIR 10 MG tablet Take 1 tablet by mouth  daily 90 tablet 0  . SPIRIVA HANDIHALER 18 MCG inhalation capsule Inhale the contents of 1  capsule via HandiHaler  daily 30 capsule 0  . tamoxifen (NOLVADEX) 20 MG tablet Take 1 tablet by mouth  daily 90 tablet 1  . traZODone (DESYREL) 50 MG tablet Take 50 mg by mouth.    . oxyCODONE-acetaminophen (PERCOCET) 10-325 MG per tablet Take 1 tablet by mouth every 4 (four) hours as needed for pain.     No current facility-administered medications for this visit.    PHYSICAL EXAMINATION: ECOG PERFORMANCE STATUS: 0 - Asymptomatic  Filed Vitals:   10/30/14 1130  BP: 131/68  Pulse: 95  Temp: 97.8 F (36.6 C)  Resp: 20   Filed Weights   10/30/14 1130  Weight: 211 lb 3.2 oz (95.8 kg)    GENERAL:alert, no distress and comfortable SKIN: skin color, texture, turgor are normal, no  rashes or significant lesions EYES: normal, Conjunctiva are pink and non-injected, sclera clear OROPHARYNX:no exudate, no erythema and lips, buccal mucosa, and tongue normal  NECK: supple, thyroid normal size, non-tender, without nodularity LYMPH:  no palpable lymphadenopathy in the cervical, axillary or inguinal LUNGS: clear to auscultation and percussion with normal breathing effort HEART: regular rate & rhythm and no murmurs and no lower extremity edema ABDOMEN:abdomen soft, non-tender and normal bowel sounds Musculoskeletal:no cyanosis of digits and no clubbing  NEURO: alert & oriented x 3 with fluent speech, no focal motor/sensory deficits BREAST: No palpable masses or nodules in either right or left breasts. No palpable axillary supraclavicular or infraclavicular adenopathy no breast tenderness or nipple discharge. (exam performed in the presence of a chaperone)  LABORATORY DATA:  I have reviewed the data as listed   Chemistry      Component Value Date/Time   NA 142 10/30/2014 1119   NA 142 08/23/2014 0623   K 4.5 10/30/2014 1119   K 3.5 08/23/2014 0623   CL  109 08/23/2014 0623   CL 106 09/23/2012 0947   CO2 25 10/30/2014 1119   CO2 28 08/23/2014 0623   BUN 11.8 10/30/2014 1119   BUN 9 08/23/2014 0623   CREATININE 0.8 10/30/2014 1119   CREATININE 0.72 08/23/2014 0623   CREATININE 0.78 08/08/2012 1336      Component Value Date/Time   CALCIUM 9.1 10/30/2014 1119   CALCIUM 9.6 08/23/2014 0623   ALKPHOS 97 10/30/2014 1119   ALKPHOS 68 08/08/2012 1336   AST 56* 10/30/2014 1119   AST 20 08/08/2012 1336   ALT 43 10/30/2014 1119   ALT 15 08/08/2012 1336   BILITOT 0.36 10/30/2014 1119   BILITOT 0.3 08/08/2012 1336       Lab Results  Component Value Date   WBC 5.0 10/30/2014   HGB 12.5 10/30/2014   HCT 39.9 10/30/2014   MCV 90.5 10/30/2014   PLT 259 10/30/2014   NEUTROABS 2.8 10/30/2014   ASSESSMENT & PLAN:  Breast cancer, left breast stage I invasive ductal  carcinoma measuring 0.5 cm ER positive PR positive HER-2/neu negative. Patient is status post lumpectomy 04/22/2010 followed by partial breast radiation. She was then begun on adjuvant tamoxifen 20 mg daily since November 2011  Tamoxifen toxicities: 1. Frequent hot flashes  Breast cancer surveillance: 1. Breast exam 10/30/2014 is normal 2. Mammogram 08/09/2014 is normal  Osteoporosis: Apparently had a reaction to Zometa.  Return to clinic in 1 year for follow-up     No orders of the defined types were placed in this encounter.   The patient has a good understanding of the overall plan. she agrees with it. She will call with any problems that may develop before her next visit here.   Rulon Eisenmenger, MD

## 2014-10-30 NOTE — Telephone Encounter (Signed)
appts made and avs printed for pt  Tanya Hogan °

## 2014-10-30 NOTE — Assessment & Plan Note (Signed)
stage I invasive ductal carcinoma measuring 0.5 cm ER positive PR positive HER-2/neu negative. Patient is status post lumpectomy 04/22/2010 followed by partial breast radiation. She was then begun on adjuvant tamoxifen 20 mg daily since November 2011  Tamoxifen toxicities: 1. Occasional hot flashes  Breast cancer surveillance: 1. Breast exam 10/30/2014 is normal 2. Mammogram 08/09/2014 is normal  Osteoporosis: Apparently had a reaction to Zometa.  Return to clinic in 1 year for follow-up

## 2014-11-01 ENCOUNTER — Ambulatory Visit: Payer: Medicare Other | Admitting: Hematology

## 2014-11-01 ENCOUNTER — Other Ambulatory Visit: Payer: Medicare Other

## 2014-11-02 ENCOUNTER — Ambulatory Visit: Payer: Medicare Other | Admitting: Adult Health

## 2014-11-02 ENCOUNTER — Other Ambulatory Visit: Payer: Medicare Other

## 2014-11-13 ENCOUNTER — Other Ambulatory Visit: Payer: Medicare Other

## 2014-11-13 ENCOUNTER — Ambulatory Visit: Payer: Medicare Other | Admitting: Hematology

## 2014-11-14 DIAGNOSIS — R32 Unspecified urinary incontinence: Secondary | ICD-10-CM | POA: Diagnosis not present

## 2014-11-26 ENCOUNTER — Encounter: Payer: Medicare Other | Admitting: Internal Medicine

## 2014-11-27 ENCOUNTER — Encounter: Payer: Medicare Other | Admitting: Internal Medicine

## 2014-11-28 ENCOUNTER — Ambulatory Visit (INDEPENDENT_AMBULATORY_CARE_PROVIDER_SITE_OTHER): Payer: Medicare Other | Admitting: Internal Medicine

## 2014-11-28 ENCOUNTER — Encounter: Payer: Self-pay | Admitting: Internal Medicine

## 2014-11-28 VITALS — BP 140/82 | HR 97 | Ht 62.0 in | Wt 208.6 lb

## 2014-11-28 DIAGNOSIS — I10 Essential (primary) hypertension: Secondary | ICD-10-CM

## 2014-11-28 DIAGNOSIS — Z950 Presence of cardiac pacemaker: Secondary | ICD-10-CM

## 2014-11-28 DIAGNOSIS — I5022 Chronic systolic (congestive) heart failure: Secondary | ICD-10-CM

## 2014-11-28 DIAGNOSIS — IMO0001 Reserved for inherently not codable concepts without codable children: Secondary | ICD-10-CM

## 2014-11-28 LAB — MDC_IDC_ENUM_SESS_TYPE_INCLINIC
Battery Remaining Longevity: 83 mo
Battery Voltage: 3.02 V
Brady Statistic AP VP Percent: 25.86 %
Brady Statistic AP VS Percent: 0.05 %
Brady Statistic AS VS Percent: 0.57 %
Brady Statistic RA Percent Paced: 25.91 %
Brady Statistic RV Percent Paced: 99.38 %
Date Time Interrogation Session: 20160406134325
Lead Channel Impedance Value: 1140 Ohm
Lead Channel Impedance Value: 399 Ohm
Lead Channel Impedance Value: 4047 Ohm
Lead Channel Impedance Value: 4047 Ohm
Lead Channel Impedance Value: 4047 Ohm
Lead Channel Impedance Value: 418 Ohm
Lead Channel Impedance Value: 418 Ohm
Lead Channel Pacing Threshold Amplitude: 0.75 V
Lead Channel Pacing Threshold Pulse Width: 0.4 ms
Lead Channel Pacing Threshold Pulse Width: 0.8 ms
Lead Channel Sensing Intrinsic Amplitude: 2.25 mV
Lead Channel Sensing Intrinsic Amplitude: 7 mV
Lead Channel Setting Pacing Amplitude: 3.25 V
Lead Channel Setting Pacing Pulse Width: 0.4 ms
Lead Channel Setting Pacing Pulse Width: 0.8 ms
Lead Channel Setting Sensing Sensitivity: 4 mV
MDC IDC MSMT LEADCHNL LV IMPEDANCE VALUE: 1178 Ohm
MDC IDC MSMT LEADCHNL LV PACING THRESHOLD AMPLITUDE: 0.75 V
MDC IDC MSMT LEADCHNL RA IMPEDANCE VALUE: 418 Ohm
MDC IDC MSMT LEADCHNL RA PACING THRESHOLD PULSEWIDTH: 0.4 ms
MDC IDC MSMT LEADCHNL RV PACING THRESHOLD AMPLITUDE: 1.5 V
MDC IDC SET LEADCHNL LV PACING AMPLITUDE: 2.25 V
MDC IDC SET LEADCHNL RA PACING AMPLITUDE: 1.5 V
MDC IDC STAT BRADY AS VP PERCENT: 73.52 %
Zone Setting Detection Interval: 400 ms
Zone Setting Detection Interval: 400 ms

## 2014-11-28 NOTE — Patient Instructions (Signed)
Your physician recommends that you continue on your current medications as directed. Please refer to the Current Medication list given to you today.  Remote monitoring is used to monitor your Pacemaker of ICD from home. This monitoring reduces the number of office visits required to check your device to one time per year. It allows [place] to keep an eye on the functioning of your device to ensure it is working properly. You are scheduled for a device check from home on 02/27/15. You may send your transmission at any time that day. If you have a wireless device, the transmission will be sent automatically. After your physician reviews your transmission, you will receive a postcard with your next transmission date.  Your physician wants you to follow-up in: 9 months with Dr. Lovena Le. You will receive a reminder letter in the mail two months in advance. If you don't receive a letter, please call our office to schedule the follow-up appointment.

## 2014-11-28 NOTE — Assessment & Plan Note (Signed)
Interrogation of her Medtronic biventricular pacemaker demonstrates normal device function. We'll plan to recheck in several months.

## 2014-11-28 NOTE — Assessment & Plan Note (Signed)
Her symptoms are class II. She will continue her current medications. She will maintain a low-sodium diet.

## 2014-11-28 NOTE — Progress Notes (Signed)
HPI Tanya Hogan returns today for followup. She is pleasant 64 year old woman with a history of obesity, hypertension, sleep apnea, chronic systolic heart failure, status post biventricular pacemaker insertion. She denies chest pain, shortness of breath, or peripheral edema. No syncope. She has finally healed after she broke her leg. She was unable take ACE inhibitors or ARB's secondary to hyperkalemia.  She denies chest pain. She has class II heart failure symptoms. She denies peripheral edema. No syncope or palpitations.  The patient was found to have an active recall PM and is PM dependent. She has undergone biventricular pacemaker insertion. Allergies  Allergen Reactions  . Penicillins Anaphylaxis  . Accupril [Quinapril Hcl] Other (See Comments)    Hyperkalemia  . Aspirin Other (See Comments)    Pt states "burns holes in stomach"  . Baking Soda-Fluoride [Sodium Fluoride] Itching  . Codeine Nausea And Vomiting  . Fluticasone Other (See Comments)    Nasal ulcer.    . Furosemide Other (See Comments)    Nose bleed  . Lactose Intolerance (Gi) Other (See Comments)    Increased calcium, sneezing, nasal issues  . Losartan Potassium Itching  . Nasonex [Mometasone Furoate] Other (See Comments)    nosebleed  . Quinine     unknown  . Rifampin Other (See Comments)    DOES NOT REMEMBER THIS MEDICATION OR ALLERGY  . Clindamycin/Lincomycin Rash  . Miralax [Polyethylene Glycol] Rash  . Sulfa Antibiotics Rash  . Sulfamethoxazole Rash  . Sulfonamide Derivatives Rash  . Zoledronic Acid Other (See Comments) and Rash    shaking     Current Outpatient Prescriptions  Medication Sig Dispense Refill  . ACCU-CHEK SMARTVIEW test strip Test two times daily 100 each 11  . acetaminophen (TYLENOL) 325 MG tablet Take 650 mg by mouth.    . AMBULATORY NON FORMULARY MEDICATION Medication Name: Needs tubing and suplies for her nebulizer machine. Dx COPD. [facility] 1 vial 0  . bumetanide (BUMEX) 1 MG  tablet Take 1 tablet (1 mg total) by mouth 2 (two) times daily. 180 tablet 3  . dicyclomine (BENTYL) 20 MG tablet Take 10-20 mg by mouth every 8 (eight) hours as needed for spasms. Take 1/2-1 tablet by mouth every eights hours as needed for cramps  11  . diltiazem (CARDIZEM) 120 MG tablet Take 120 mg by mouth.    . DULoxetine (CYMBALTA) 30 MG capsule Take 30 mg by mouth daily.     . fluconazole (DIFLUCAN) 150 MG tablet   0  . Fluticasone-Salmeterol (ADVAIR) 250-50 MCG/DOSE AEPB Frequency:two times daily   Dosage:250-50   MCG/DOSE  Instructions:Advair Diskus (250-50MCG/DOSE MISC, 2  puff  Inhalation two times daily)  Note:    . gabapentin (NEURONTIN) 600 MG tablet Take 600 mg by mouth 2 (two) times daily as needed (flare ups).    [name] glucosamine-chondroitin 500-400 MG tablet Take 1 tablet by mouth daily.    [name] levothyroxine (SYNTHROID, LEVOTHROID) 75 MCG tablet Take 1 tablet by mouth  daily (Patient taking differently: Take 75 mcg by mouth daily before breakfast. Take 1 tablet by mouth  daily) 90 tablet 3  . loratadine (CLARITIN) 10 MG tablet Take 10 mg by mouth daily as needed for allergies.     . metFORMIN (GLUCOPHAGE) 1000 MG tablet Take 1 tablet (1,000 mg total) by mouth 2 (two) times daily with a meal. TAKE ONE TABLET BY MOUTH TWICE DAILY WITH MEALS (Patient taking differently: Take 1,000 mg by mouth 2 (two) times daily with a meal. )  60 tablet 2  . mirabegron ER (MYRBETRIQ) 50 MG TB24 tablet Take 50 mg by mouth daily.    . Misc Natural Products (GLUCOSAMINE CHONDROITIN ADV) TABS Frequency:daily   Dosage:0.0     Instructions:Glucosamine Chondroitin Complx ( CAPS, 2 Oral daily)  Note:    . Multiple Vitamin (MULTI-VITAMINS) TABS Frequency:daily   Dosage:0.0     Instructions:Multiple Vitamins ( TABS, 1 Oral daily)  Note:    . Multiple Vitamins-Minerals (MULTIVITAMIN WITH MINERALS) tablet Take 1 tablet by mouth daily.    . mupirocin cream (BACTROBAN) 2 % Apply topically.    . mupirocin ointment  (BACTROBAN) 2 % Apply 1 application topically 2 (two) times daily. nose    . nebivolol (BYSTOLIC) 5 MG tablet Take 1 tablet (5 mg total)  by mouth daily. 90 tablet 3  . pantoprazole (PROTONIX) 40 MG tablet Take 40 mg by mouth daily.   11  . pravastatin (PRAVACHOL) 40 MG tablet Take 1 tablet by mouth  daily 90 tablet 3  . PROAIR HFA 108 (90 BASE) MCG/ACT inhaler Inhale 2 puffs into the  lungs every 6 hours as  needed (Patient taking differently: Inhale 2 puffs into the  lungs every 6 hours as  needed shortness of breath) 34 g 2  . SINGULAIR 10 MG tablet Take 1 tablet by mouth  daily 90 tablet 0  . SPIRIVA HANDIHALER 18 MCG inhalation capsule Inhale the contents of 1  capsule via HandiHaler  daily 30 capsule 0  . tamoxifen (NOLVADEX) 20 MG tablet Take 1 tablet by mouth  daily 90 tablet 1  . traZODone (DESYREL) 50 MG tablet Take 50 mg by mouth.     No current facility-administered medications for this visit.     Past Medical History  Diagnosis Date  . HTN (hypertension)   . CHF (congestive heart failure)   . LBBB (left bundle branch block)   . Fibromyalgia   . Alkaline phosphatase elevation   . Hypothyroidism   . Closed fracture of unspecified part of radius (alone)   . Depression   . DDD (degenerative disc disease), lumbar   . Carpal tunnel syndrome   . IBS (irritable bowel syndrome)   . Breast cancer     ROS:   All systems reviewed and negative except as noted in the HPI.   Past Surgical History  Procedure Laterality Date  . Pacemaker placement  2009    [facility] cardiology  . Knee surgery    . Foot surgery    . Tubal ligation    . Breast lumpectomy    . Pacemaker generator change N/A 08/23/2014    Procedure: PACEMAKER GENERATOR CHANGE;  Surgeon: Evans Lance, MD;  Location: [facility] CATH LAB;  Service: Cardiovascular;  Laterality: N/A;     Family History  Problem Relation Age of Onset  . Heart failure    . Breast cancer    . Hypertension    . Melanoma Mother       History   Social History  . Marital Status: Divorced    Spouse Name: N/A  . Number of Children: N/A  . Years of Education: N/A   Occupational History  . Not on file.   Social History Main Topics  . Smoking status: Former Smoker    Quit date: 08/24/1966  . Smokeless tobacco: Not on file  . Alcohol Use: No  . Drug Use: No  . Sexual Activity: Not Currently   Other Topics Concern  . Not on file  Social History Narrative     BP 140/82 mmHg  Pulse 97  Ht 5' 2"  (1.575 m)  Wt 208 lb 9.6 oz (94.62 kg)  BMI 38.14 kg/m2  Physical Exam:  Well appearing 71 old woman, NAD HEENT: Unremarkable Neck:   7 cm JVD, no thyromegally Lungs:  Clear with no wheezes, rales, or rhonchi. HEART:  Regular rate rhythm, no murmurs, no rubs, no clicks Abd:  soft, positive bowel sounds, no organomegally, no rebound, no guarding Ext:  2 plus pulses, no edema, no cyanosis, no clubbing Skin:  No rashes no nodules Neuro:  CN II through XII intact, motor grossly intact  DEVICE  Normal device function.  See PaceArt for details.   Assess/Plan:

## 2014-11-28 NOTE — Assessment & Plan Note (Signed)
She is encouraged to lose weight. She will maintain a low-sodium diet. She is encouraged to increase her physical activity.

## 2014-11-28 NOTE — Assessment & Plan Note (Signed)
Her blood pressure is slightly elevated. She will continue her current medications.

## 2014-11-29 ENCOUNTER — Other Ambulatory Visit: Payer: Self-pay | Admitting: Internal Medicine

## 2014-12-12 ENCOUNTER — Other Ambulatory Visit: Payer: Self-pay | Admitting: Family Medicine

## 2014-12-14 ENCOUNTER — Emergency Department (INDEPENDENT_AMBULATORY_CARE_PROVIDER_SITE_OTHER)
Admission: EM | Admit: 2014-12-14 | Discharge: 2014-12-14 | Disposition: A | Discharge status: Home / Self Care | Payer: Medicare Other | Source: Home / Self Care | Attending: Emergency Medicine | Admitting: Emergency Medicine

## 2014-12-14 ENCOUNTER — Encounter: Payer: Self-pay | Admitting: Emergency Medicine

## 2014-12-14 ENCOUNTER — Emergency Department (INDEPENDENT_AMBULATORY_CARE_PROVIDER_SITE_OTHER): Payer: Medicare Other

## 2014-12-14 DIAGNOSIS — S6991XA Unspecified injury of right wrist, hand and finger(s), initial encounter: Secondary | ICD-10-CM | POA: Diagnosis not present

## 2014-12-14 DIAGNOSIS — M79642 Pain in left hand: Secondary | ICD-10-CM | POA: Diagnosis not present

## 2014-12-14 DIAGNOSIS — M25531 Pain in right wrist: Secondary | ICD-10-CM

## 2014-12-14 DIAGNOSIS — S6992XA Unspecified injury of left wrist, hand and finger(s), initial encounter: Secondary | ICD-10-CM | POA: Diagnosis not present

## 2014-12-14 NOTE — ED Provider Notes (Signed)
CSN: [id number]     Arrival date & time 12/14/14  1555 History   First MD Initiated Contact with Patient 12/14/14 1601     Chief Complaint  Patient presents with  . Wrist Pain   (Consider location/radiation/quality/duration/timing/severity/associated sxs/prior Treatment) HPI She fell today while at home.  She does not know how she landed but thinks she fell with outstretched hands.  Has pain in her right wrist and third fourth and fifth fingers of her left hand.  Did not hit her head.  She also has a scrape on her right arm.  She states that she has osteopenia.  She had a previous right wrist ORIF a few years ago.  Not been using any medications or modalities.  Past Medical History  Diagnosis Date  . HTN (hypertension)   . CHF (congestive heart failure)   . LBBB (left bundle branch block)   . Fibromyalgia   . Alkaline phosphatase elevation   . Hypothyroidism   . Closed fracture of unspecified part of radius (alone)   . Depression   . DDD (degenerative disc disease), lumbar   . Carpal tunnel syndrome   . IBS (irritable bowel syndrome)   . Breast cancer    Past Surgical History  Procedure Laterality Date  . Pacemaker placement  2009    [facility] cardiology  . Knee surgery    . Foot surgery    . Tubal ligation    . Breast lumpectomy    . Pacemaker generator change N/A 08/23/2014    Procedure: PACEMAKER GENERATOR CHANGE;  Surgeon: Evans Lance, MD;  Location: [facility] CATH LAB;  Service: Cardiovascular;  Laterality: N/A;   Family History  Problem Relation Age of Onset  . Heart failure    . Breast cancer    . Hypertension    . Melanoma Mother    History  Substance Use Topics  . Smoking status: Former Smoker    Quit date: 08/24/1966  . Smokeless tobacco: Not on file  . Alcohol Use: No   OB History    No data available     Review of Systems  All other systems reviewed and are negative.   Allergies  Penicillins; Accupril; Aspirin; Baking soda-fluoride; Codeine;  Fluticasone; Furosemide; Lactose intolerance (gi); Losartan potassium; Nasonex; Quinine; Rifampin; Clindamycin/lincomycin; Miralax; Sulfa antibiotics; Sulfamethoxazole; Sulfonamide derivatives; and Zoledronic acid  Home Medications   Prior to Admission medications   Medication Sig Start Date End Date Taking? Authorizing Provider  ACCU-CHEK SMARTVIEW test strip Test two times daily    Hali Marry, MD  acetaminophen (TYLENOL) 325 MG tablet Take 650 mg by mouth.    Historical Provider, MD  AMBULATORY NON FORMULARY MEDICATION Medication Name: Needs tubing and suplies for her nebulizer machine. Dx COPD. Sharon 04/13/14   Hali Marry, MD  bumetanide (BUMEX) 1 MG tablet Take 1 tablet (1 mg total) by mouth 2 (two) times daily. 07/12/14   Hali Marry, MD  dicyclomine (BENTYL) 20 MG tablet Take 10-20 mg by mouth every 8 (eight) hours as needed for spasms. Take 1/2-1 tablet by mouth every eights hours as needed for cramps 08/02/14   Historical Provider, MD  diltiazem (CARDIZEM) 120 MG tablet Take 120 mg by mouth.    Historical Provider, MD  DULoxetine (CYMBALTA) 30 MG capsule Take 30 mg by mouth daily.     Historical Provider, MD  fluconazole (DIFLUCAN) 150 MG tablet  10/10/14   Historical Provider, MD  Fluticasone-Salmeterol (ADVAIR) 250-50 MCG/DOSE AEPB Frequency:two  times daily   Dosage:250-50   MCG/DOSE  Instructions:Advair Diskus (250-50MCG/DOSE MISC, 2  puff  Inhalation two times daily)  Note:    Historical Provider, MD  gabapentin (NEURONTIN) 600 MG tablet Take 600 mg by mouth 2 (two) times daily as needed (flare ups).    Historical Provider, MD  glucosamine-chondroitin 500-400 MG tablet Take 1 tablet by mouth daily.    Historical Provider, MD  levothyroxine (SYNTHROID, LEVOTHROID) 75 MCG tablet Take 1 tablet by mouth  daily Patient taking differently: Take 75 mcg by mouth daily before breakfast. Take 1 tablet by mouth  daily 11/20/13   Hali Marry, MD   loratadine (CLARITIN) 10 MG tablet Take 10 mg by mouth daily as needed for allergies.     Historical Provider, MD  metFORMIN (GLUCOPHAGE) 1000 MG tablet Take 1 tablet by mouth two  times daily with meals 12/13/14   Hali Marry, MD  mirabegron ER (MYRBETRIQ) 50 MG TB24 tablet Take 50 mg by mouth daily. 11/14/14   Historical Provider, MD  Misc Natural Products (GLUCOSAMINE CHONDROITIN ADV) TABS Frequency:daily   Dosage:0.0     Instructions:Glucosamine Chondroitin Complx ( CAPS, 2 Oral daily)  Note:    Historical Provider, MD  montelukast (SINGULAIR) 10 MG tablet Take 1 tablet by mouth  daily 12/13/14   Hali Marry, MD  Multiple Vitamin (MULTI-VITAMINS) TABS Frequency:daily   Dosage:0.0     Instructions:Multiple Vitamins ( TABS, 1 Oral daily)  Note:    Historical Provider, MD  Multiple Vitamins-Minerals (MULTIVITAMIN WITH MINERALS) tablet Take 1 tablet by mouth daily.    Historical Provider, MD  mupirocin cream (BACTROBAN) 2 % Apply topically.    Historical Provider, MD  mupirocin ointment (BACTROBAN) 2 % Apply 1 application topically 2 (two) times daily. nose 04/06/13   Historical Provider, MD  nebivolol (BYSTOLIC) 5 MG tablet Take 1 tablet (5 mg total)  by mouth daily. 11/20/13   Hali Marry, MD  pantoprazole (PROTONIX) 40 MG tablet Take 40 mg by mouth daily.  08/02/14   Historical Provider, MD  pravastatin (PRAVACHOL) 40 MG tablet Take 1 tablet by mouth  daily 11/20/13   Hali Marry, MD  [facility] HFA 108 (90 BASE) MCG/ACT inhaler Inhale 2 puffs into the  lungs every 6 hours as  needed 12/13/14   Hali Marry, MD  [facility] HANDIHALER 18 MCG inhalation capsule Inhale the contents of 1  capsule via HandiHaler  daily 04/18/14   Hali Marry, MD  tamoxifen (NOLVADEX) 20 MG tablet Take 1 tablet by mouth  daily 06/22/14   Minette Headland, NP  traZODone (DESYREL) 50 MG tablet Take 50 mg by mouth. 05/10/14   Historical Provider, MD   BP 145/82 mmHg  Pulse 88   Temp(Src) 97.6 F (36.4 C) (Oral)  SpO2 98% Physical Exam  Constitutional: She is oriented to person, place, and time. She appears well-developed and well-nourished.  HENT:  Head: Normocephalic and atraumatic.  Eyes: No scleral icterus.  Neck: Neck supple.  Cardiovascular: Regular rhythm and normal heart sounds.   Pulmonary/Chest: Effort normal and breath sounds normal. No respiratory distress.  Musculoskeletal:  Right wrist: Tender to Palpation on her aspect and TFCC.  No tenderness to distal radius or snuffbox.  No swelling, no ecchymoses.  Skin intact. Left hand: Tenderness to palpation at the third fourth and fifth fingers.  Full range of motion of all of her joints.  Skin intact.  No swelling or bruising. Right forearm: She has a  small linear abrasion, no bleeding, no signs of infection, no foreign bodies at her dorsal forearm.  Neurological: She is alert and oriented to person, place, and time.  Skin: Skin is warm and dry.  Psychiatric: She has a normal mood and affect. Her speech is normal.  Nursing note and vitals reviewed.   ED Course  Procedures (including critical care time) Labs Review Labs Reviewed - No data to display  Imaging Review Dg Wrist Complete Right  12/14/2014   CLINICAL DATA:  Right wrist pain after falling.  EXAM: RIGHT WRIST - COMPLETE 3+ VIEW  COMPARISON:  09/26/2009  FINDINGS: Patient has had ORIF of the distal radius. There is no evidence for acute fracture or dislocation. Intercarpal spaces are normal. There is mild joint space narrowing at the first carpometacarpal joint.  IMPRESSION: 1. Postoperative changes. 2.  No evidence for acute  abnormality.   Electronically Signed   By: Nolon Nations M.D.   On: 12/14/2014 16:45   Dg Hand Complete Left  12/14/2014   CLINICAL DATA:  Fall today.  EXAM: LEFT HAND - COMPLETE 3+ VIEW  COMPARISON:  None.  FINDINGS: Negative for fracture. Mild degenerative change in the radiocarpal joint with spurring of the radial  styloid process. No erosion.  IMPRESSION: Negative for fracture.   Electronically Signed   By: Franchot Gallo M.D.   On: 12/14/2014 16:45     MDM   1. Right wrist pain   2. Left hand pain     X-rays of her right wrist and left hand were obtained and read by the radiologist as above.  Continue conservative treatment including ice, elevation, rest.  Follow-up as directed.  The counter pain control.  Patient declines wrist brace or medication.  Will follow-up with her PCP as directed.   Tanya Forehand, MD 12/14/14 1755

## 2014-12-14 NOTE — ED Notes (Signed)
Pt states she fell this afternoon and landed on her right wrist. She is having some pain in her wrist and thumb.

## 2015-01-08 DIAGNOSIS — G4733 Obstructive sleep apnea (adult) (pediatric): Secondary | ICD-10-CM | POA: Diagnosis not present

## 2015-01-09 DIAGNOSIS — G4733 Obstructive sleep apnea (adult) (pediatric): Secondary | ICD-10-CM | POA: Diagnosis not present

## 2015-01-14 ENCOUNTER — Encounter: Payer: Self-pay | Admitting: Family Medicine

## 2015-01-14 ENCOUNTER — Ambulatory Visit (INDEPENDENT_AMBULATORY_CARE_PROVIDER_SITE_OTHER): Payer: Medicare Other | Admitting: Family Medicine

## 2015-01-14 VITALS — BP 125/76 | HR 85 | Ht 61.25 in | Wt 205.0 lb

## 2015-01-14 DIAGNOSIS — Z0000 Encounter for general adult medical examination without abnormal findings: Secondary | ICD-10-CM | POA: Diagnosis not present

## 2015-01-14 DIAGNOSIS — Z1159 Encounter for screening for other viral diseases: Secondary | ICD-10-CM

## 2015-01-14 DIAGNOSIS — E119 Type 2 diabetes mellitus without complications: Secondary | ICD-10-CM

## 2015-01-14 DIAGNOSIS — Z114 Encounter for screening for human immunodeficiency virus [HIV]: Secondary | ICD-10-CM | POA: Diagnosis not present

## 2015-01-14 NOTE — Patient Instructions (Signed)
complete physical examination Keep up a regular exercise program and make sure you are eating a healthy diet Try to eat 4 servings of dairy a day, or if you are lactose intolerant take a calcium with vitamin D daily.  Your vaccines are up to date.   

## 2015-01-14 NOTE — Progress Notes (Signed)
Subjective:    Tanya Hogan is a 64 y.o. female who presents for Medicare Annual/Subsequent preventive examination.  Preventive Screening-Counseling & Management  Tobacco History  Smoking status  . Former Smoker  . Quit date: 08/24/1966  Smokeless tobacco  . Not on file     Problems Prior to Visit 1. Saw GI and they put her on 2 new medicatins.  One is for spasms and taking as needed.   2. Has seen Urology for incontinence.  Start on Myrbetriq.   3. Feels like she has a dental infection. She has been having dental work, root canal. She was placed on zpack about 2 weeks ago and says doesn't really feel any better.    Current Problems (verified) Patient Active Problem List   Diagnosis Date Noted  . Diabetes mellitus type 2, controlled, with complications 80/32/1224  . Rash 05/10/2014  . Jaw pain 05/10/2014  . Obesity, Class II, BMI 35-39.9, with comorbidity 06/09/2013  . I 05/23/2013  . Closed nondisplaced fracture of distal phalanx of left great toe 04/21/2013  . COPD (chronic obstructive pulmonary disease) 04/03/2013  . Obesity 04/23/2011  . Breast cancer, left breast 03/18/2010  . FATIGUE 01/09/2010  . HYPERLIPIDEMIA 11/07/2009  . OSTEOPENIA 10/29/2009  . LEFT BUNDLE BRANCH BLOCK 10/23/2009  . PACEMAKER, PERMANENT 10/23/2009  . ALKALINE PHOSPHATASE, ELEVATED 10/13/2009  . UNSPECIFIED HYPOTHYROIDISM 10/03/2009  . DIABETIC PERIPHERAL NEUROPATHY 10/03/2009  . DEPRESSION 07/02/2009  . ESSENTIAL HYPERTENSION, BENIGN 07/02/2009  . BACK PAIN, CHRONIC 07/02/2009  . Diabetes type 2, controlled 04/26/2009  . Chronic systolic CHF (congestive heart failure) 04/26/2009    Medications Prior to Visit Current Outpatient Prescriptions on File Prior to Visit  Medication Sig Dispense Refill  . ACCU-CHEK SMARTVIEW test strip Test two times daily 100 each 11  . acetaminophen (TYLENOL) 325 MG tablet Take 650 mg by mouth.    . AMBULATORY NON FORMULARY MEDICATION Medication Name:  Needs tubing and suplies for her nebulizer machine. Dx COPD. [facility] 1 vial 0  . bumetanide (BUMEX) 1 MG tablet Take 1 tablet (1 mg total) by mouth 2 (two) times daily. 180 tablet 3  . dicyclomine (BENTYL) 20 MG tablet Take 10-20 mg by mouth every 8 (eight) hours as needed for spasms. Take 1/2-1 tablet by mouth every eights hours as needed for cramps  11  . diltiazem (CARDIZEM) 120 MG tablet Take 120 mg by mouth.    . DULoxetine (CYMBALTA) 30 MG capsule Take 30 mg by mouth daily.     . Fluticasone-Salmeterol (ADVAIR) 250-50 MCG/DOSE AEPB Frequency:two times daily   Dosage:250-50   MCG/DOSE  Instructions:Advair Diskus (250-50MCG/DOSE MISC, 2  puff  Inhalation two times daily)  Note:    . glucosamine-chondroitin 500-400 MG tablet Take 1 tablet by mouth daily.    [name] levothyroxine (SYNTHROID, LEVOTHROID) 75 MCG tablet Take 1 tablet by mouth  daily (Patient taking differently: Take 75 mcg by mouth daily before breakfast. Take 1 tablet by mouth  daily) 90 tablet 3  . loratadine (CLARITIN) 10 MG tablet Take 10 mg by mouth daily as needed for allergies.     . metFORMIN (GLUCOPHAGE) 1000 MG tablet Take 1 tablet by mouth two  times daily with meals 180 tablet 0  . mirabegron ER (MYRBETRIQ) 50 MG TB24 tablet Take 50 mg by mouth daily.    . Misc Natural Products (GLUCOSAMINE CHONDROITIN ADV) TABS Frequency:daily   Dosage:0.0     Instructions:Glucosamine Chondroitin Complx ( CAPS, 2 Oral daily)  Note:    .  montelukast (SINGULAIR) 10 MG tablet Take 1 tablet by mouth  daily 90 tablet 0  . mupirocin cream (BACTROBAN) 2 % Apply topically.    . mupirocin ointment (BACTROBAN) 2 % Apply 1 application topically 2 (two) times daily. nose    . nebivolol (BYSTOLIC) 5 MG tablet Take 1 tablet (5 mg total)  by mouth daily. 90 tablet 3  . pantoprazole (PROTONIX) 40 MG tablet Take 40 mg by mouth daily.   11  . pravastatin (PRAVACHOL) 40 MG tablet Take 1 tablet by mouth  daily 90 tablet 3  . PROAIR HFA 108 (90 BASE)  MCG/ACT inhaler Inhale 2 puffs into the  lungs every 6 hours as  needed 34 g 0  . SPIRIVA HANDIHALER 18 MCG inhalation capsule Inhale the contents of 1  capsule via HandiHaler  daily 30 capsule 0  . traZODone (DESYREL) 50 MG tablet Take 50 mg by mouth.     No current facility-administered medications on file prior to visit.    Current Medications (verified) Current Outpatient Prescriptions  Medication Sig Dispense Refill  . ACCU-CHEK SMARTVIEW test strip Test two times daily 100 each 11  . acetaminophen (TYLENOL) 325 MG tablet Take 650 mg by mouth.    . AMBULATORY NON FORMULARY MEDICATION Medication Name: Needs tubing and suplies for her nebulizer machine. Dx COPD. [facility] 1 vial 0  . bumetanide (BUMEX) 1 MG tablet Take 1 tablet (1 mg total) by mouth 2 (two) times daily. 180 tablet 3  . dicyclomine (BENTYL) 20 MG tablet Take 10-20 mg by mouth every 8 (eight) hours as needed for spasms. Take 1/2-1 tablet by mouth every eights hours as needed for cramps  11  . diltiazem (CARDIZEM) 120 MG tablet Take 120 mg by mouth.    . DULoxetine (CYMBALTA) 30 MG capsule Take 30 mg by mouth daily.     . Fluticasone-Salmeterol (ADVAIR) 250-50 MCG/DOSE AEPB Frequency:two times daily   Dosage:250-50   MCG/DOSE  Instructions:Advair Diskus (250-50MCG/DOSE MISC, 2  puff  Inhalation two times daily)  Note:    . glucosamine-chondroitin 500-400 MG tablet Take 1 tablet by mouth daily.    [name] levothyroxine (SYNTHROID, LEVOTHROID) 75 MCG tablet Take 1 tablet by mouth  daily (Patient taking differently: Take 75 mcg by mouth daily before breakfast. Take 1 tablet by mouth  daily) 90 tablet 3  . loratadine (CLARITIN) 10 MG tablet Take 10 mg by mouth daily as needed for allergies.     . metFORMIN (GLUCOPHAGE) 1000 MG tablet Take 1 tablet by mouth two  times daily with meals 180 tablet 0  . mirabegron ER (MYRBETRIQ) 50 MG TB24 tablet Take 50 mg by mouth daily.    . Misc Natural Products (GLUCOSAMINE CHONDROITIN ADV)  TABS Frequency:daily   Dosage:0.0     Instructions:Glucosamine Chondroitin Complx ( CAPS, 2 Oral daily)  Note:    . montelukast (SINGULAIR) 10 MG tablet Take 1 tablet by mouth  daily 90 tablet 0  . mupirocin cream (BACTROBAN) 2 % Apply topically.    . mupirocin ointment (BACTROBAN) 2 % Apply 1 application topically 2 (two) times daily. nose    . nebivolol (BYSTOLIC) 5 MG tablet Take 1 tablet (5 mg total)  by mouth daily. 90 tablet 3  . pantoprazole (PROTONIX) 40 MG tablet Take 40 mg by mouth daily.   11  . pravastatin (PRAVACHOL) 40 MG tablet Take 1 tablet by mouth  daily 90 tablet 3  . PROAIR HFA 108 (90 BASE) MCG/ACT inhaler  Inhale 2 puffs into the  lungs every 6 hours as  needed 34 g 0  . SPIRIVA HANDIHALER 18 MCG inhalation capsule Inhale the contents of 1  capsule via HandiHaler  daily 30 capsule 0  . traZODone (DESYREL) 50 MG tablet Take 50 mg by mouth.     No current facility-administered medications for this visit.     Allergies (verified) Penicillins; Accupril; Aspirin; Baking soda-fluoride; Codeine; Fluticasone; Furosemide; Lactose intolerance (gi); Losartan potassium; Nasonex; Quinine; Rifampin; Clindamycin/lincomycin; Miralax; Sulfa antibiotics; Sulfamethoxazole; Sulfonamide derivatives; and Zoledronic acid   PAST HISTORY  Family History Family History  Problem Relation Age of Onset  . Heart failure    . Breast cancer    . Hypertension    . Melanoma Mother     Social History History  Substance Use Topics  . Smoking status: Former Smoker    Quit date: 08/24/1966  . Smokeless tobacco: Not on file  . Alcohol Use: No     Are there smokers in your home (other than you)? No  Risk Factors Current exercise habits: walks some  Dietary issues discussed: None   Cardiac risk factors: diabetes mellitus, hypertension and obesity (BMI >= 30 kg/m2).  Depression Screen (Note: if answer to either of the following is "Yes", a more complete depression screening is indicated)    Over the past two weeks, have you felt down, depressed or hopeless? No  Over the past two weeks, have you felt little interest or pleasure in doing things? No  Have you lost interest or pleasure in daily life? No  Do you often feel hopeless? No  Do you cry easily over simple problems? No  Activities of Daily Living In your present state of health, do you have any difficulty performing the following activities?:  Driving? No Managing money?  No Feeding yourself? No Getting from bed to chair? No  Climbing a flight of stairs? Yes Preparing food and eating?: No Bathing or showering? Yes Getting dressed: No Getting to the toilet? No Using the toilet:No Moving around from place to place: No In the past year have you fallen or had a near fall?:Yes   Are you sexually active?  No  Do you have more than one partner?  No  Hearing Difficulties: Yes Do you often ask people to speak up or repeat themselves? Yes Do you experience ringing or noises in your ears? Yes Do you have difficulty understanding soft or whispered voices? No   Do you feel that you have a problem with memory? Yes  Do you often misplace items? No  Do you feel safe at home?  No  Cognitive Testing  Alert? Yes  Normal Appearance?Yes  Oriented to person? Yes  Place? Yes   Time? Yes  Recall of three objects?  Yes  Can perform simple calculations? Yes  Displays appropriate judgment?Yes  Can read the correct time from a watch face?Yes   6 CIT score of 4/28 ( normal)    Advanced Directives have been discussed with the patient? No  List the Names of Other Physician/Practitioners you currently use: See care team members.   Indicate any recent Medical Services you may have received from other than [facility] providers in the past year (date may be approximate).  Immunization History  Administered Date(s) Administered  . Influenza Split 05/10/2012, 05/04/2014  . Influenza Whole 06/14/2009, 05/08/2010, 04/24/2011  .  Influenza,inj,Quad PF,36+ Mos 04/28/2013, 05/04/2014  . Pneumococcal Conjugate-13 05/25/2014  . Pneumococcal Polysaccharide-23 11/28/2012  . Td 08/24/2004, 03/07/2010  .  Tdap 07/14/2012  . Zoster 04/24/2011    Screening Tests Health Maintenance  Topic Date Due  . Hepatitis C Screening  10[birth year]04/[birth year]  . HIV Screening  05/01/1964  . OPHTHALMOLOGY EXAM  08/01/2014  . HEMOGLOBIN A1C  01/11/2015  . INFLUENZA VACCINE  03/25/2015  . FOOT EXAM  04/14/2015  . URINE MICROALBUMIN  04/14/2015  . MAMMOGRAM  08/09/2016  . PNA vac Low Risk Adult (2 of 2 - PPSV23) 11/28/2017  . COLONOSCOPY  06/13/2019  . TETANUS/TDAP  07/14/2022  . DEXA SCAN  Completed  . ZOSTAVAX  Completed    All answers were reviewed with the patient and necessary referrals were made:  [name],[name], MD   01/14/2015   History reviewed: allergies, current medications, past family history, past medical history, past social history, past surgical history and problem list  Review of Systems A comprehensive review of systems was negative.    Objective:     Vision by Snellen chart: eye exam is UTD  Body mass index is 38.41 kg/(m^2). BP 125/76 mmHg  Pulse 85  Ht 5' 1.25" (1.556 m)  Wt 205 lb (92.987 kg)  BMI 38.41 kg/m2  BP 125/76 mmHg  Pulse 85  Ht 5' 1.25" (1.556 m)  Wt 205 lb (92.987 kg)  BMI 38.41 kg/m2 General appearance: alert, cooperative and appears stated age Head: Normocephalic, without obvious abnormality, atraumatic Eyes: conj clear, EOMI, PEERLA Ears: normal TM's and external ear canals both ears Nose: Nares normal. Septum midline. Mucosa normal. No drainage or sinus tenderness. Throat: lips, mucosa, and tongue normal; teeth and gums normal Neck: no adenopathy, no carotid bruit, no JVD, supple, symmetrical, trachea midline and thyroid not enlarged, symmetric, no tenderness/mass/nodules Back: symmetric, no curvature. ROM normal. No CVA tenderness. Lungs: clear to auscultation bilaterally Breasts:  normal appearance, no masses or tenderness Heart: regular rate and rhythm, S1, S2 normal, no murmur, click, rub or gallop Abdomen: soft, non-tender; bowel sounds normal; no masses,  no organomegaly Extremities: extremities normal, atraumatic, no cyanosis or edema Pulses: 2+ and symmetric Skin: Skin color, texture, turgor normal. No rashes or lesions Lymph nodes: Cervical, supraclavicular, and axillary nodes normal. Neurologic: Alert and oriented X 3, normal strength and tone. Normal symmetric reflexes. Normal coordination and gait     Assessment:     Annual Wellness Exam       Plan:     During the course of the visit the patient was educated and counseled about appropriate screening and preventive services including:    hiv screen   Hep C screen  CMP  Due for A1C for diabetic f/u on Friday.   Diet review for nutrition referral? Yes ____  Not Indicated _X__   Patient Instructions (the written plan) was given to the patient.  Medicare Attestation I have personally reviewed: The patient's medical and social history Their use of alcohol, tobacco or illicit drugs Their current medications and supplements The patient's functional ability including ADLs,fall risks, home safety risks, cognitive, and hearing and visual impairment Diet and physical activities Evidence for depression or mood disorders  The patient's weight, height, BMI, and visual acuity have been recorded in the chart.  I have made referrals, counseling, and provided education to the patient based on review of the above and I have provided the patient with a written personalized care plan for preventive services.     [name],[name], MD   01/14/2015

## 2015-01-15 LAB — HEPATITIS C ANTIBODY: HCV AB: NEGATIVE

## 2015-01-15 LAB — COMPLETE METABOLIC PANEL WITH GFR
ALBUMIN: 4 g/dL (ref 3.5–5.2)
ALK PHOS: 136 U/L — AB (ref 39–117)
ALT: 46 U/L — ABNORMAL HIGH (ref 0–35)
AST: 62 U/L — AB (ref 0–37)
BUN: 10 mg/dL (ref 6–23)
CALCIUM: 9.6 mg/dL (ref 8.4–10.5)
CHLORIDE: 104 meq/L (ref 96–112)
CO2: 23 mEq/L (ref 19–32)
Creat: 0.74 mg/dL (ref 0.50–1.10)
GFR, EST NON AFRICAN AMERICAN: 85 mL/min
GFR, Est African American: >89 mL/min
GLUCOSE: 107 mg/dL — AB (ref 70–99)
POTASSIUM: 4.4 meq/L (ref 3.5–5.3)
SODIUM: 141 meq/L (ref 135–145)
TOTAL PROTEIN: 7 g/dL (ref 6.0–8.3)
Total Bilirubin: 0.3 mg/dL (ref 0.2–1.2)

## 2015-01-15 LAB — HIV ANTIBODY (ROUTINE TESTING W REFLEX): HIV 1&2 Ab, 4th Generation: NONREACTIVE

## 2015-01-15 LAB — HEMOGLOBIN A1C
Hgb A1c MFr Bld: 6.9 % — ABNORMAL HIGH (ref <5.7)
Mean Plasma Glucose: 151 mg/dL — ABNORMAL HIGH (ref <117)

## 2015-01-17 ENCOUNTER — Ambulatory Visit: Payer: Medicare Other | Admitting: Family Medicine

## 2015-01-23 ENCOUNTER — Encounter: Payer: Self-pay | Admitting: Family Medicine

## 2015-01-28 ENCOUNTER — Ambulatory Visit (INDEPENDENT_AMBULATORY_CARE_PROVIDER_SITE_OTHER): Payer: Medicare Other | Admitting: Family Medicine

## 2015-01-28 ENCOUNTER — Encounter: Payer: Self-pay | Admitting: Family Medicine

## 2015-01-28 VITALS — BP 138/81 | HR 92 | Ht 61.25 in | Wt 201.0 lb

## 2015-01-28 DIAGNOSIS — R945 Abnormal results of liver function studies: Secondary | ICD-10-CM

## 2015-01-28 DIAGNOSIS — R7989 Other specified abnormal findings of blood chemistry: Secondary | ICD-10-CM | POA: Diagnosis not present

## 2015-01-28 DIAGNOSIS — E118 Type 2 diabetes mellitus with unspecified complications: Secondary | ICD-10-CM | POA: Diagnosis not present

## 2015-01-28 DIAGNOSIS — I10 Essential (primary) hypertension: Secondary | ICD-10-CM | POA: Diagnosis not present

## 2015-01-28 MED ORDER — INSULIN GLARGINE 100 UNIT/ML SOLOSTAR PEN: 10.0000 [IU] | PEN_INJECTOR | Freq: Every day | SUBCUTANEOUS | Status: DC

## 2015-01-28 NOTE — Patient Instructions (Signed)
Shoot for around 1400 calories per day.

## 2015-01-28 NOTE — Progress Notes (Signed)
   Subjective:    Patient ID: Tanya Hogan, female    DOB: Jul 30, 1951, 64 y.o.   MRN: [id number]  HPI Hypertension- Pt denies chest pain, SOB, dizziness, or heart palpitations.  Taking meds as directed w/o problems.  Denies medication side effects.    Diabetes - no hypoglycemic events. No wounds or sores that are not healing well. No increased thirst or urination. Checking glucose at home. Taking medications as prescribed without any side effects.  She has been getting diarrhea on the metformin. She has spastic colon.   Elevated liver enzymes - she doesn't drink alcohol. She does take Tylenol . Most days she takes about 3 Tylenol per day. But some days up to 6. She did recently increase her Tylenol dose Per her rheumatologist. Some days she has a hard time functioning in keeping the house cleaning because of her pain.  Review of Systems     Objective:   Physical Exam  Constitutional: She is oriented to person, place, and time. She appears well-developed and well-nourished.  HENT:  Head: Normocephalic and atraumatic.  Cardiovascular: Normal rate, regular rhythm and normal heart sounds.   Pulmonary/Chest: Effort normal and breath sounds normal.  Neurological: She is alert and oriented to person, place, and time.  Skin: Skin is warm and dry.  Psychiatric: She has a normal mood and affect. Her behavior is normal.          Assessment & Plan:  HTN - well controlled. Continue current regimen. Follow-up in 3 months.  DM- well controlled. She wanted to know how many calories she should be eating. Based on calculations around 1400 per days.   Elevated liver enzymes - she doesn't drink alcohol. She does take Tylenol . She is on a statin. Will recheck liver enzymes next week as well as an acute hepatitis panel. If levers is still elevated then will do a abdominal ultrasound to look at the liver.

## 2015-01-31 ENCOUNTER — Other Ambulatory Visit: Payer: Self-pay | Admitting: Family Medicine

## 2015-01-31 MED ORDER — ACCU-CHEK NANO SMARTVIEW W/DEVICE KIT: PACK | Status: DC

## 2015-01-31 MED ORDER — ACCU-CHEK FASTCLIX LANCET KIT: PACK | Status: DC

## 2015-01-31 MED ORDER — INSULIN PEN NEEDLE 32G X 4 MM MISC: Status: DC

## 2015-01-31 MED ORDER — INSULIN GLARGINE 100 UNIT/ML SOLOSTAR PEN: 10.0000 [IU] | PEN_INJECTOR | Freq: Every day | SUBCUTANEOUS | Status: DC

## 2015-01-31 MED ORDER — GLUCOSE BLOOD VI STRP: ORAL_STRIP | Status: DC

## 2015-02-04 ENCOUNTER — Telehealth: Payer: Self-pay | Admitting: Family Medicine

## 2015-02-04 ENCOUNTER — Other Ambulatory Visit: Payer: Self-pay | Admitting: *Deleted

## 2015-02-04 DIAGNOSIS — R7989 Other specified abnormal findings of blood chemistry: Secondary | ICD-10-CM | POA: Diagnosis not present

## 2015-02-04 DIAGNOSIS — R799 Abnormal finding of blood chemistry, unspecified: Secondary | ICD-10-CM | POA: Diagnosis not present

## 2015-02-04 DIAGNOSIS — R945 Abnormal results of liver function studies: Secondary | ICD-10-CM | POA: Diagnosis not present

## 2015-02-04 MED ORDER — AMBULATORY NON FORMULARY MEDICATION: Status: DC

## 2015-02-04 NOTE — Telephone Encounter (Signed)
Increase by one unit each night unstill sugar under 130.

## 2015-02-04 NOTE — Telephone Encounter (Signed)
Pt states that her Blood sugar is running 150 in the morning and that is high for her. She is taking 15 units of insulin at bedtime , should she add more units? Please have nurse to call her back with instructions

## 2015-02-05 LAB — HEPATIC FUNCTION PANEL
ALBUMIN: 4.4 g/dL (ref 3.5–5.2)
ALT: 70 U/L — AB (ref 0–35)
AST: 90 U/L — ABNORMAL HIGH (ref 0–37)
Alkaline Phosphatase: 152 U/L — ABNORMAL HIGH (ref 39–117)
BILIRUBIN DIRECT: 0.1 mg/dL (ref 0.0–0.3)
BILIRUBIN TOTAL: 0.4 mg/dL (ref 0.2–1.2)
Indirect Bilirubin: 0.3 mg/dL (ref 0.2–1.2)
Total Protein: 7.3 g/dL (ref 6.0–8.3)

## 2015-02-05 LAB — HEPATITIS PANEL, ACUTE
HCV Ab: NEGATIVE
HEP B S AG: NEGATIVE
Hep A IgM: NONREACTIVE
Hep B C IgM: NONREACTIVE

## 2015-02-05 NOTE — Addendum Note (Signed)
Addended by: Beatrice Lecher D on: 02/05/2015 05:16 PM   Modules accepted: Orders

## 2015-02-06 NOTE — Telephone Encounter (Signed)
Called patient and gave her Dr.[name]'s instructions to increase by 1 unit each night UNTIL blood sugar is under 130

## 2015-02-08 ENCOUNTER — Ambulatory Visit (INDEPENDENT_AMBULATORY_CARE_PROVIDER_SITE_OTHER): Payer: Medicare Other

## 2015-02-08 DIAGNOSIS — K760 Fatty (change of) liver, not elsewhere classified: Secondary | ICD-10-CM | POA: Diagnosis not present

## 2015-02-08 DIAGNOSIS — R7989 Other specified abnormal findings of blood chemistry: Secondary | ICD-10-CM | POA: Diagnosis not present

## 2015-02-08 DIAGNOSIS — N281 Cyst of kidney, acquired: Secondary | ICD-10-CM

## 2015-02-08 DIAGNOSIS — R935 Abnormal findings on diagnostic imaging of other abdominal regions, including retroperitoneum: Secondary | ICD-10-CM

## 2015-02-08 DIAGNOSIS — R945 Abnormal results of liver function studies: Secondary | ICD-10-CM

## 2015-02-26 DIAGNOSIS — K760 Fatty (change of) liver, not elsewhere classified: Secondary | ICD-10-CM | POA: Diagnosis not present

## 2015-02-26 DIAGNOSIS — R197 Diarrhea, unspecified: Secondary | ICD-10-CM | POA: Diagnosis not present

## 2015-02-26 DIAGNOSIS — R740 Nonspecific elevation of levels of transaminase and lactic acid dehydrogenase [LDH]: Secondary | ICD-10-CM | POA: Diagnosis not present

## 2015-02-27 ENCOUNTER — Ambulatory Visit (INDEPENDENT_AMBULATORY_CARE_PROVIDER_SITE_OTHER): Payer: Medicare Other | Admitting: *Deleted

## 2015-02-27 ENCOUNTER — Encounter: Payer: Self-pay | Admitting: Internal Medicine

## 2015-02-27 ENCOUNTER — Telehealth: Payer: Self-pay | Admitting: Internal Medicine

## 2015-02-27 DIAGNOSIS — I442 Atrioventricular block, complete: Secondary | ICD-10-CM

## 2015-02-27 NOTE — Telephone Encounter (Signed)
NewMessage  Pt calling to make sure remote check was sent successfully; please call back and discuss.

## 2015-02-27 NOTE — Progress Notes (Signed)
Remote pacemaker transmission.   

## 2015-02-27 NOTE — Telephone Encounter (Signed)
Spoke w/ pt and attempted to help her trouble shoot her monitor. After several unsuccessful attempts instructed pt to call tech services. Pt verbalized understanding.

## 2015-03-01 ENCOUNTER — Other Ambulatory Visit: Payer: Self-pay | Admitting: *Deleted

## 2015-03-01 ENCOUNTER — Ambulatory Visit (INDEPENDENT_AMBULATORY_CARE_PROVIDER_SITE_OTHER): Payer: Medicare Other | Admitting: Family Medicine

## 2015-03-01 ENCOUNTER — Telehealth: Payer: Self-pay | Admitting: Family Medicine

## 2015-03-01 ENCOUNTER — Encounter: Payer: Self-pay | Admitting: Family Medicine

## 2015-03-01 ENCOUNTER — Other Ambulatory Visit: Payer: Self-pay | Admitting: Family Medicine

## 2015-03-01 VITALS — BP 118/67 | HR 94 | Ht 61.0 in | Wt 205.0 lb

## 2015-03-01 DIAGNOSIS — N281 Cyst of kidney, acquired: Secondary | ICD-10-CM

## 2015-03-01 DIAGNOSIS — R748 Abnormal levels of other serum enzymes: Secondary | ICD-10-CM

## 2015-03-01 DIAGNOSIS — K760 Fatty (change of) liver, not elsewhere classified: Secondary | ICD-10-CM | POA: Diagnosis not present

## 2015-03-01 DIAGNOSIS — R935 Abnormal findings on diagnostic imaging of other abdominal regions, including retroperitoneum: Secondary | ICD-10-CM | POA: Diagnosis not present

## 2015-03-01 DIAGNOSIS — Q6100 Congenital renal cyst, unspecified: Secondary | ICD-10-CM | POA: Diagnosis not present

## 2015-03-01 NOTE — Patient Instructions (Signed)
Fat and Cholesterol Control Diet Fat and cholesterol levels in your blood and organs are influenced by your diet. High levels of fat and cholesterol may lead to diseases of the heart, small and large blood vessels, gallbladder, liver, and pancreas. CONTROLLING FAT AND CHOLESTEROL WITH DIET Although exercise and lifestyle factors are important, your diet is key. That is because certain foods are known to raise cholesterol and others to lower it. The goal is to balance foods for their effect on cholesterol and more importantly, to replace saturated and trans fat with other types of fat, such as monounsaturated fat, polyunsaturated fat, and omega-3 fatty acids. On average, a person should consume no more than 15 to 17 g of saturated fat daily. Saturated and trans fats are considered "bad" fats, and they will raise LDL cholesterol. Saturated fats are primarily found in animal products such as meats, butter, and cream. However, that does not mean you need to give up all your favorite foods. Today, there are good tasting, low-fat, low-cholesterol substitutes for most of the things you like to eat. Choose low-fat or nonfat alternatives. Choose round or loin cuts of red meat. These types of cuts are lowest in fat and cholesterol. Chicken (without the skin), fish, veal, and ground [place] breast are great choices. Eliminate fatty meats, such as hot dogs and salami. Even shellfish have little or no saturated fat. Have a 3 oz (85 g) portion when you eat lean meat, poultry, or fish. Trans fats are also called "partially hydrogenated oils." They are oils that have been scientifically manipulated so that they are solid at room temperature resulting in a longer shelf life and improved taste and texture of foods in which they are added. Trans fats are found in stick margarine, some tub margarines, cookies, crackers, and baked goods.  When baking and cooking, oils are a great substitute for butter. The monounsaturated oils are  especially beneficial since it is believed they lower LDL and raise HDL. The oils you should avoid entirely are saturated tropical oils, such as coconut and palm.  Remember to eat a lot from food groups that are naturally free of saturated and trans fat, including fish, fruit, vegetables, beans, grains (barley, rice, couscous, bulgur wheat), and pasta (without cream sauces).  IDENTIFYING FOODS THAT LOWER FAT AND CHOLESTEROL  Soluble fiber may lower your cholesterol. This type of fiber is found in fruits such as apples, vegetables such as broccoli, potatoes, and carrots, legumes such as beans, peas, and lentils, and grains such as barley. Foods fortified with plant sterols (phytosterol) may also lower cholesterol. You should eat at least 2 g per day of these foods for a cholesterol lowering effect.  Read package labels to identify low-saturated fats, trans fat free, and low-fat foods at the supermarket. Select cheeses that have only 2 to 3 g saturated fat per ounce. Use a heart-healthy tub margarine that is free of trans fats or partially hydrogenated oil. When buying baked goods (cookies, crackers), avoid partially hydrogenated oils. Breads and muffins should be made from whole grains (whole-wheat or whole oat flour, instead of "flour" or "enriched flour"). Buy non-creamy canned soups with reduced salt and no added fats.  FOOD PREPARATION TECHNIQUES  Never deep-fry. If you must fry, either stir-fry, which uses very little fat, or use non-stick cooking sprays. When possible, broil, bake, or roast meats, and steam vegetables. Instead of putting butter or margarine on vegetables, use lemon and herbs, applesauce, and cinnamon (for squash and sweet potatoes). Use nonfat  yogurt, salsa, and low-fat dressings for salads.  LOW-SATURATED FAT / LOW-FAT FOOD SUBSTITUTES Meats / Saturated Fat (g)  Avoid: Steak, marbled (3 oz/85 g) / 11 g  Choose: Steak, lean (3 oz/85 g) / 4 g  Avoid: Hamburger (3 oz/85 g) / 7  g  Choose: Hamburger, lean (3 oz/85 g) / 5 g  Avoid: Ham (3 oz/85 g) / 6 g  Choose: Ham, lean cut (3 oz/85 g) / 2.4 g  Avoid: Chicken, with skin, dark meat (3 oz/85 g) / 4 g  Choose: Chicken, skin removed, dark meat (3 oz/85 g) / 2 g  Avoid: Chicken, with skin, light meat (3 oz/85 g) / 2.5 g  Choose: Chicken, skin removed, light meat (3 oz/85 g) / 1 g Dairy / Saturated Fat (g)  Avoid: Whole milk (1 cup) / 5 g  Choose: Low-fat milk, 2% (1 cup) / 3 g  Choose: Low-fat milk, 1% (1 cup) / 1.5 g  Choose: Skim milk (1 cup) / 0.3 g  Avoid: Hard cheese (1 oz/28 g) / 6 g  Choose: Skim milk cheese (1 oz/28 g) / 2 to 3 g  Avoid: Cottage cheese, 4% fat (1 cup) / 6.5 g  Choose: Low-fat cottage cheese, 1% fat (1 cup) / 1.5 g  Avoid: Ice cream (1 cup) / 9 g  Choose: Sherbet (1 cup) / 2.5 g  Choose: Nonfat frozen yogurt (1 cup) / 0.3 g  Choose: Frozen fruit bar / trace  Avoid: Whipped cream (1 tbs) / 3.5 g  Choose: Nondairy whipped topping (1 tbs) / 1 g Condiments / Saturated Fat (g)  Avoid: Mayonnaise (1 tbs) / 2 g  Choose: Low-fat mayonnaise (1 tbs) / 1 g  Avoid: Butter (1 tbs) / 7 g  Choose: Extra light margarine (1 tbs) / 1 g  Avoid: Coconut oil (1 tbs) / 11.8 g  Choose: Olive oil (1 tbs) / 1.8 g  Choose: Corn oil (1 tbs) / 1.7 g  Choose: Safflower oil (1 tbs) / 1.2 g  Choose: Sunflower oil (1 tbs) / 1.4 g  Choose: Soybean oil (1 tbs) / 2.4 g  Choose: Canola oil (1 tbs) / 1 g Document Released: 08/10/2005 Document Revised: 12/05/2012 Document Reviewed: 11/08/2013 [employer] Patient Information 2015 [place], [place]. This information is not intended to replace advice given to you by your health care provider. Make sure you discuss any questions you have with your health care provider. Low-Fat Diet for Pancreatitis or Gallbladder Conditions A low-fat diet can be helpful if you have pancreatitis or a gallbladder condition. With these conditions, your pancreas and  gallbladder have trouble digesting fats. A healthy eating plan with less fat will help rest your pancreas and gallbladder and reduce your symptoms. WHAT DO I NEED TO KNOW ABOUT THIS DIET?  Eat a low-fat diet.  Reduce your fat intake to less than 20-30% of your total daily calories. This is less than 50-60 g of fat per day.  Remember that you need some fat in your diet. Ask your dietician what your daily goal should be.  Choose nonfat and low-fat healthy foods. Look for the words "nonfat," "low fat," or "fat free."  As a guide, look on the label and choose foods with less than 3 g of fat per serving. Eat only one serving.  Avoid alcohol.  Do not smoke. If you need help quitting, talk with your health care provider.  Eat small frequent meals instead of three large heavy meals. WHAT FOODS  CAN I EAT? Grains Include healthy grains and starches such as potatoes, wheat bread, fiber-rich cereal, and brown rice. Choose whole grain options whenever possible. In adults, whole grains should account for 45-65% of your daily calories.  Fruits and Vegetables Eat plenty of fruits and vegetables. Fresh fruits and vegetables add fiber to your diet. Meats and Other Protein Sources Eat lean meat such as chicken and pork. Trim any fat off of meat before cooking it. Eggs, fish, and beans are other sources of protein. In adults, these foods should account for 10-35% of your daily calories. Dairy Choose low-fat milk and dairy options. Dairy includes fat and protein, as well as calcium.  Fats and Oils Limit high-fat foods such as fried foods, sweets, baked goods, sugary drinks.  Other Creamy sauces and condiments, such as mayonnaise, can add extra fat. Think about whether or not you need to use them, or use smaller amounts or low fat options. WHAT FOODS ARE NOT RECOMMENDED?  High fat foods, such as:  [employer].  Ice cream.  [place] toast.  Sweet rolls.  Pizza.  Cheese bread.  Foods covered  with batter, butter, creamy sauces, or cheese.  Fried foods.  Sugary drinks and desserts.  Foods that cause gas or bloating Document Released: 08/15/2013 Document Reviewed: 08/15/2013 [facility] Patient Information 2015 [place], [place]. This information is not intended to replace advice given to you by your health care provider. Make sure you discuss any questions you have with your health care provider.

## 2015-03-01 NOTE — Telephone Encounter (Signed)
Pt will need a CMP for MRI W W/O contrast. [facility] lab, spoke with [place]. This order was added onto the lab she had drawn today with the specimen #:[id number].

## 2015-03-01 NOTE — Progress Notes (Signed)
   Subjective:    Patient ID: Tanya Hogan, female    DOB: 1950/11/27, 64 y.o.   MRN: [id number]  HPI She comes in today complaining of RUQ pain which she though was  secondary to excessive bloating. She says it is better today. She denies any abdominal pain with the bloating. She feels like it started after she had her ultrasound performed. Ultrasound was performed on June 17 for elevated liver enzymes. They did see a 3 x 3 cm complex cyst on the lower pole of the left kidney and have recommended MRI for further evaluation. I also had her stop her pravastatin and she has now been off of it for 3 weeks to make sure that that was not causing elevation in her liver enzymes.  Saw Dr. Roselyn Reef, GI last week.  He wants the [place] pictures and copy of most recent labs.   She says the medication she was given for the bloating and pain maker her mouth too dry.   Review of Systems     Objective:   Physical Exam  Constitutional: She is oriented to person, place, and time. She appears well-developed and well-nourished.  HENT:  Head: Normocephalic and atraumatic.  Cardiovascular: Normal rate, regular rhythm and normal heart sounds.   Pulmonary/Chest: Effort normal and breath sounds normal.  Abdominal: Soft. Bowel sounds are normal. She exhibits no distension and no mass. There is tenderness. There is no rebound and no guarding.  TTP  Neurological: She is alert and oriented to person, place, and time.  Skin: Skin is warm and dry.  Psychiatric: She has a normal mood and affect. Her behavior is normal.          Assessment & Plan:  Renal cyst - we ordered her MRI 2 weeks ago and never heard anything. We will check on this. May be awaiting PA.    Elevated liver enzymes - will recheck level. If look much better then could be the pravastatin. Acute hep test was negative. Printed out all labs and given to patient to take with her.  I reviewed her recent labs with her.   Fatty liver - Given addition  information of low fat diet to give her a start.  She has already tried to make some dietary changes.

## 2015-03-02 LAB — COMPREHENSIVE METABOLIC PANEL
ALK PHOS: 135 U/L — AB (ref 39–117)
ALT: 47 U/L — ABNORMAL HIGH (ref 0–35)
AST: 72 U/L — ABNORMAL HIGH (ref 0–37)
Albumin: 4 g/dL (ref 3.5–5.2)
BUN: 12 mg/dL (ref 6–23)
CO2: 27 meq/L (ref 19–32)
Calcium: 9.3 mg/dL (ref 8.4–10.5)
Chloride: 103 mEq/L (ref 96–112)
Creat: 0.71 mg/dL (ref 0.50–1.10)
GLUCOSE: 132 mg/dL — AB (ref 70–99)
Potassium: 4.3 mEq/L (ref 3.5–5.3)
SODIUM: 140 meq/L (ref 135–145)
TOTAL PROTEIN: 6.8 g/dL (ref 6.0–8.3)
Total Bilirubin: 0.3 mg/dL (ref 0.2–1.2)

## 2015-03-02 LAB — HEPATIC FUNCTION PANEL
ALBUMIN: 4 g/dL (ref 3.5–5.2)
ALK PHOS: 135 U/L — AB (ref 39–117)
ALT: 47 U/L — ABNORMAL HIGH (ref 0–35)
AST: 72 U/L — ABNORMAL HIGH (ref 0–37)
Bilirubin, Direct: 0.1 mg/dL (ref 0.0–0.3)
Indirect Bilirubin: 0.2 mg/dL (ref 0.2–1.2)
Total Bilirubin: 0.3 mg/dL (ref 0.2–1.2)
Total Protein: 6.8 g/dL (ref 6.0–8.3)

## 2015-03-04 ENCOUNTER — Telehealth: Payer: Self-pay | Admitting: *Deleted

## 2015-03-04 DIAGNOSIS — N281 Cyst of kidney, acquired: Secondary | ICD-10-CM

## 2015-03-04 NOTE — Addendum Note (Signed)
Addended by: Teddy Spike on: 03/04/2015 12:18 PM   Modules accepted: Orders

## 2015-03-04 NOTE — Telephone Encounter (Signed)
Cannot do MRI pt has permanent pacemaker. She recommended that I call [facility] to see what they recommended. [phone]. lvm for rtn call.Audelia Hives [place]

## 2015-03-07 ENCOUNTER — Other Ambulatory Visit: Payer: Self-pay | Admitting: Family Medicine

## 2015-03-07 DIAGNOSIS — J3489 Other specified disorders of nose and nasal sinuses: Secondary | ICD-10-CM | POA: Diagnosis not present

## 2015-03-07 DIAGNOSIS — N281 Cyst of kidney, acquired: Secondary | ICD-10-CM

## 2015-03-08 LAB — CUP PACEART REMOTE DEVICE CHECK
Brady Statistic AP VS Percent: 0.06 %
Brady Statistic AS VP Percent: 74.14 %
Brady Statistic AS VS Percent: 1.5 %
Brady Statistic RV Percent Paced: 98.44 %
Lead Channel Impedance Value: 1159 Ohm
Lead Channel Impedance Value: 1292 Ohm
Lead Channel Impedance Value: 4047 Ohm
Lead Channel Impedance Value: 437 Ohm
Lead Channel Impedance Value: 475 Ohm
Lead Channel Pacing Threshold Amplitude: 1.375 V
Lead Channel Pacing Threshold Pulse Width: 0.4 ms
Lead Channel Sensing Intrinsic Amplitude: 3 mV
Lead Channel Sensing Intrinsic Amplitude: 9.375 mV
Lead Channel Sensing Intrinsic Amplitude: 9.375 mV
Lead Channel Setting Pacing Amplitude: 2.25 V
MDC IDC MSMT BATTERY REMAINING LONGEVITY: 73 mo
MDC IDC MSMT BATTERY VOLTAGE: 3.01 V
MDC IDC MSMT LEADCHNL LV IMPEDANCE VALUE: 4047 Ohm
MDC IDC MSMT LEADCHNL LV IMPEDANCE VALUE: 4047 Ohm
MDC IDC MSMT LEADCHNL RA IMPEDANCE VALUE: 437 Ohm
MDC IDC MSMT LEADCHNL RA PACING THRESHOLD AMPLITUDE: 0.75 V
MDC IDC MSMT LEADCHNL RA PACING THRESHOLD PULSEWIDTH: 0.4 ms
MDC IDC MSMT LEADCHNL RA SENSING INTR AMPL: 3 mV
MDC IDC MSMT LEADCHNL RV IMPEDANCE VALUE: 570 Ohm
MDC IDC SESS DTM: 20160706182021
MDC IDC SET LEADCHNL LV PACING PULSEWIDTH: 0.8 ms
MDC IDC SET LEADCHNL RA PACING AMPLITUDE: 1.5 V
MDC IDC SET LEADCHNL RV PACING AMPLITUDE: 3.5 V
MDC IDC SET LEADCHNL RV PACING PULSEWIDTH: 0.4 ms
MDC IDC SET LEADCHNL RV SENSING SENSITIVITY: 4 mV
MDC IDC SET ZONE DETECTION INTERVAL: 400 ms
MDC IDC STAT BRADY AP VP PERCENT: 24.31 %
MDC IDC STAT BRADY RA PERCENT PACED: 24.36 %
Zone Setting Detection Interval: 400 ms

## 2015-03-13 ENCOUNTER — Encounter (HOSPITAL_BASED_OUTPATIENT_CLINIC_OR_DEPARTMENT_OTHER): Payer: Self-pay

## 2015-03-13 ENCOUNTER — Ambulatory Visit (HOSPITAL_BASED_OUTPATIENT_CLINIC_OR_DEPARTMENT_OTHER)
Admission: RE | Admit: 2015-03-13 | Discharge: 2015-03-13 | Disposition: A | Discharge status: Home / Self Care | Payer: Medicare Other | Source: Ambulatory Visit | Attending: Family Medicine | Admitting: Family Medicine

## 2015-03-13 DIAGNOSIS — N289 Disorder of kidney and ureter, unspecified: Secondary | ICD-10-CM | POA: Insufficient documentation

## 2015-03-13 DIAGNOSIS — K8020 Calculus of gallbladder without cholecystitis without obstruction: Secondary | ICD-10-CM | POA: Diagnosis not present

## 2015-03-13 DIAGNOSIS — K760 Fatty (change of) liver, not elsewhere classified: Secondary | ICD-10-CM | POA: Diagnosis not present

## 2015-03-13 DIAGNOSIS — N281 Cyst of kidney, acquired: Secondary | ICD-10-CM

## 2015-03-13 DIAGNOSIS — Z853 Personal history of malignant neoplasm of breast: Secondary | ICD-10-CM | POA: Insufficient documentation

## 2015-03-13 MED ORDER — IOHEXOL 300 MG/ML  SOLN
100.0000 mL | Freq: Once | INTRAMUSCULAR | Status: AC | PRN
  Administered 2015-03-13: 100 mL via INTRAVENOUS

## 2015-03-20 ENCOUNTER — Encounter: Payer: Self-pay | Admitting: *Deleted

## 2015-03-29 ENCOUNTER — Other Ambulatory Visit: Payer: Self-pay | Admitting: Family Medicine

## 2015-04-12 ENCOUNTER — Other Ambulatory Visit: Payer: Self-pay | Admitting: Family Medicine

## 2015-04-18 ENCOUNTER — Encounter: Payer: Self-pay | Admitting: Emergency Medicine

## 2015-04-18 ENCOUNTER — Emergency Department (INDEPENDENT_AMBULATORY_CARE_PROVIDER_SITE_OTHER)
Admission: EM | Admit: 2015-04-18 | Discharge: 2015-04-18 | Disposition: A | Discharge status: Home / Self Care | Payer: Medicare Other | Source: Home / Self Care | Attending: Family Medicine | Admitting: Family Medicine

## 2015-04-18 DIAGNOSIS — L299 Pruritus, unspecified: Secondary | ICD-10-CM

## 2015-04-18 MED ORDER — TRIAMCINOLONE ACETONIDE 40 MG/ML IJ SUSP
40.0000 mg | Freq: Once | INTRAMUSCULAR | Status: AC
  Administered 2015-04-18: 40 mg via INTRAMUSCULAR

## 2015-04-18 NOTE — ED Provider Notes (Signed)
CSN: [id number]     Arrival date & time 04/18/15  1216 History   First MD Initiated Contact with Patient 04/18/15 1231     Chief Complaint  Patient presents with  . Pruritis      HPI Comments: Patient awoke today with soreness in her mouth that has persisted. About one hour ago she developed a generalized pruritic pin-point erythematous rash.  She took two Benadryl caps and the rash resolved but pruritus persisted.  Her hands and feet itch the most.  She feels well otherwise.  No fevers, chills, and sweats.  No known exposure to allergens.  However, she recalls that she ate some red sausage yesterday morning that she had not had before. Patient reports that she has a history of asthma.  Patient is a 64 y.o. female presenting with rash. The history is provided by the patient.  Rash Location:  Full body Quality: itchiness   Severity:  Moderate Onset quality:  Sudden Duration:  1 hour Timing:  Constant Progression:  Partially resolved Chronicity:  New Context: food   Context: not animal contact, not chemical exposure, not exposure to similar rash, not hot tub use, not insect bite/sting, not medications, not new detergent/soap and not plant contact   Relieved by:  Antihistamines Worsened by:  Nothing tried Ineffective treatments:  None tried Associated symptoms: wheezing   Associated symptoms: no abdominal pain, no diarrhea, no fatigue, no fever, no headaches, no joint pain, no myalgias, no nausea, no periorbital edema, no shortness of breath, no sore throat, no throat swelling, no tongue swelling and no URI     Past Medical History  Diagnosis Date  . HTN (hypertension)   . CHF (congestive heart failure)   . LBBB (left bundle branch block)   . Fibromyalgia   . Alkaline phosphatase elevation   . Hypothyroidism   . Closed fracture of unspecified part of radius (alone)   . Depression   . DDD (degenerative disc disease), lumbar   . Carpal tunnel syndrome   . IBS (irritable bowel  syndrome)   . Breast cancer 2011   Past Surgical History  Procedure Laterality Date  . Pacemaker placement  2009    [facility] cardiology  . Knee surgery    . Foot surgery    . Tubal ligation    . Breast lumpectomy    . Pacemaker generator change N/A 08/23/2014    Procedure: PACEMAKER GENERATOR CHANGE;  Surgeon: Evans Lance, MD;  Location: [facility] CATH LAB;  Service: Cardiovascular;  Laterality: N/A;   Family History  Problem Relation Age of Onset  . Heart failure    . Breast cancer    . Hypertension    . Melanoma Mother    Social History  Substance Use Topics  . Smoking status: Former Smoker    Quit date: 08/24/1966  . Smokeless tobacco: None  . Alcohol Use: No   OB History    No data available     Review of Systems  Constitutional: Negative for fever and fatigue.  HENT: Negative for sore throat.   Respiratory: Positive for wheezing. Negative for shortness of breath.   Gastrointestinal: Negative for nausea, abdominal pain and diarrhea.  Musculoskeletal: Negative for myalgias and arthralgias.  Skin: Positive for rash.  Neurological: Negative for headaches.    Allergies  Penicillins; Accupril; Aspirin; Baking soda-fluoride; Codeine; Fluticasone; Furosemide; Lactose intolerance (gi); Losartan potassium; Metformin and related; Nasonex; Quinine; Rifampin; Clindamycin/lincomycin; Miralax; Sulfa antibiotics; Sulfamethoxazole; Sulfonamide derivatives; and Zoledronic acid  Home Medications  Prior to Admission medications   Medication Sig Start Date End Date Taking? Authorizing Provider  acetaminophen (TYLENOL) 325 MG tablet Take 650 mg by mouth.    Historical Provider, MD  AMBULATORY NON FORMULARY MEDICATION Medication Name: Needs tubing and suplies for her nebulizer machine. Dx COPD. [place] 04/13/14   Hali Marry, MD  AMBULATORY NON FORMULARY MEDICATION Fastclix lancets Dx: E11.9 02/04/15   Hali Marry, MD  Blood Glucose Monitoring Suppl  (ACCU-CHEK NANO SMARTVIEW) W/DEVICE KIT Test two times daily 01/31/15   Hali Marry, MD  bumetanide (BUMEX) 1 MG tablet Take 1 tablet (1 mg total) by mouth 2 (two) times daily. 07/12/14   Hali Marry, MD  BYSTOLIC 5 MG tablet Take 1 tablet by mouth  daily 04/15/15   Hali Marry, MD  dicyclomine (BENTYL) 20 MG tablet Take 10-20 mg by mouth every 8 (eight) hours as needed for spasms. Take 1/2-1 tablet by mouth every eights hours as needed for cramps 08/02/14   Historical Provider, MD  diltiazem (CARDIZEM) 120 MG tablet Take 120 mg by mouth.    Historical Provider, MD  DULoxetine (CYMBALTA) 30 MG capsule Take 30 mg by mouth daily.     Historical Provider, MD  Fluticasone-Salmeterol (ADVAIR) 250-50 MCG/DOSE AEPB Frequency:two times daily   Dosage:250-50   MCG/DOSE  Instructions:Advair Diskus (250-50MCG/DOSE MISC, 2  puff  Inhalation two times daily)  Note:    Historical Provider, MD  glucosamine-chondroitin 500-400 MG tablet Take 1 tablet by mouth daily.    Historical Provider, MD  glucose blood (ACCU-CHEK SMARTVIEW) test strip Test two times daily 01/31/15   Hali Marry, MD  Insulin Glargine (LANTUS SOLOSTAR) 100 UNIT/ML Solostar Pen Inject 10 Units into the skin daily at 10 pm. 01/31/15   Hali Marry, MD  Insulin Pen Needle (BD PEN NEEDLE NANO U/F) 32G X 4 MM MISC Test two times daily 01/31/15   Hali Marry, MD  Lancets Misc. (ACCU-CHEK FASTCLIX LANCET) KIT Test two times daily. 01/31/15   Hali Marry, MD  levothyroxine (SYNTHROID, LEVOTHROID) 75 MCG tablet Take 1 tablet by mouth  daily 04/01/15   Hali Marry, MD  loratadine (CLARITIN) 10 MG tablet Take 10 mg by mouth daily as needed for allergies.     Historical Provider, MD  mirabegron ER (MYRBETRIQ) 50 MG TB24 tablet Take 50 mg by mouth daily. 11/14/14   Historical Provider, MD  Misc Natural Products (GLUCOSAMINE CHONDROITIN ADV) TABS Frequency:daily   Dosage:0.0     Instructions:Glucosamine  Chondroitin Complx ( CAPS, 2 Oral daily)  Note:    Historical Provider, MD  montelukast (SINGULAIR) 10 MG tablet Take 1 tablet by mouth  daily 04/01/15   Hali Marry, MD  mupirocin cream (BACTROBAN) 2 % Apply topically.    Historical Provider, MD  mupirocin ointment (BACTROBAN) 2 % Apply 1 application topically 2 (two) times daily. nose 04/06/13   Historical Provider, MD  pantoprazole (PROTONIX) 40 MG tablet Take 40 mg by mouth daily.  08/02/14   Historical Provider, MD  pravastatin (PRAVACHOL) 40 MG tablet Take 1 tablet by mouth  daily 11/20/13   Hali Marry, MD  [facility] HFA 108 [id number] BASE) MCG/ACT inhaler Use 2 puffs every 4 hours  as needed 04/01/15   Hali Marry, MD  [facility] HANDIHALER 18 MCG inhalation capsule Inhale the contents of 1  capsule via HandiHaler  daily 04/18/14   Hali Marry, MD  traZODone (DESYREL) 50 MG tablet  Take 50 mg by mouth. 05/10/14   Historical Provider, MD   BP 152/80 mmHg  Pulse 78  Temp(Src) 98.3 F (36.8 C) (Oral)  Ht 5' 1"  (1.549 m)  Wt 202 lb (91.627 kg)  BMI 38.19 kg/m2  SpO2 95% Physical Exam Nursing notes and Vital Signs reviewed. Appearance:  Patient appears stated age, and in no acute distress.  Patient is obese (BMI 38.2) Eyes:  Pupils are equal, round, and reactive to light and accomodation.  Extraocular movement is intact.  Conjunctivae are not inflamed  Ears:  Canals normal.  Tympanic membranes normal.  Nose:  Normal turbinates.  No sinus tenderness.    Mouth:  Normal; no lesions Pharynx:  Normal; no lesions Neck:  Supple.  No adenopathy  Lungs:  Clear to auscultation.  Breath sounds are equal.  Moving air well. Heart:  Regular rate and rhythm without murmurs, rubs, or gallops.  Abdomen:  Nontender without masses or hepatosplenomegaly.  Bowel sounds are present.  No CVA or flank tenderness.  Extremities:  No edema.  No calf tenderness Skin:  No rash present.   ED Course  Procedures  none   MDM   1. Pruritus;  suspect allergic reaction.  ?atypical hand/foot/mouth disease   Kenalog 58m IM  May continue taking Benadryl for rash and itching. Followup with Family Doctor if not improved in 4 to 5 days.    SKandra Nicolas MD 04/18/15 1440

## 2015-04-18 NOTE — Discharge Instructions (Signed)
May continue taking Benadryl for rash and itching.   Pruritus  Pruritus is an itch. There are many different problems that can cause an itch. Dry skin is one of the most common causes of itching. Most cases of itching do not require medical attention.  HOME CARE INSTRUCTIONS  Make sure your skin is moistened on a regular basis. A moisturizer that contains petroleum jelly is best for keeping moisture in your skin. If you develop a rash, you may try the following for relief:   Use corticosteroid cream.  Apply cool compresses to the affected areas.  Bathe with Epsom salts or baking soda in the bathwater.  Soak in colloidal oatmeal baths. These are available at your pharmacy.  Apply baking soda paste to the rash. Stir water into baking soda until it reaches a paste-like consistency.  Use an anti-itch lotion.  Take over-the-counter diphenhydramine medicine by mouth as the instructions direct.  Avoid scratching. Scratching may cause the rash to become infected. If itching is very bad, your caregiver may suggest prescription lotions or creams to lessen your symptoms.  Avoid hot showers, which can make itching worse. A cold shower may help with itching as long as you use a moisturizer after the shower. SEEK MEDICAL CARE IF: The itching does not go away after several days. Document Released: 04/22/2011 Document Revised: 12/25/2013 Document Reviewed: 04/22/2011 [facility] Patient Information 2015 [place], [place]. This information is not intended to replace advice given to you by your health care provider. Make sure you discuss any questions you have with your health care provider.

## 2015-04-18 NOTE — ED Notes (Signed)
Woke up this morning with a sore mouth, a little later fine red rash all over body, palms of hands, bottoms of feet itching, eyes red and itching.  Took 2 Benadryl rash is gone, eyes are still red and itching, took 2 more Benadryl, mouth feels like its going to blister, bottoms of feet, palms of hands and eyes still itch.

## 2015-04-22 DIAGNOSIS — G4733 Obstructive sleep apnea (adult) (pediatric): Secondary | ICD-10-CM | POA: Diagnosis not present

## 2015-04-30 ENCOUNTER — Ambulatory Visit: Payer: Medicare Other | Admitting: Family Medicine

## 2015-04-30 DIAGNOSIS — K219 Gastro-esophageal reflux disease without esophagitis: Secondary | ICD-10-CM | POA: Diagnosis not present

## 2015-04-30 DIAGNOSIS — K760 Fatty (change of) liver, not elsewhere classified: Secondary | ICD-10-CM | POA: Diagnosis not present

## 2015-04-30 DIAGNOSIS — R740 Nonspecific elevation of levels of transaminase and lactic acid dehydrogenase [LDH]: Secondary | ICD-10-CM | POA: Diagnosis not present

## 2015-05-01 DIAGNOSIS — G4733 Obstructive sleep apnea (adult) (pediatric): Secondary | ICD-10-CM | POA: Diagnosis not present

## 2015-05-06 ENCOUNTER — Encounter: Payer: Self-pay | Admitting: Family Medicine

## 2015-05-06 ENCOUNTER — Ambulatory Visit (INDEPENDENT_AMBULATORY_CARE_PROVIDER_SITE_OTHER): Payer: Medicare Other | Admitting: Family Medicine

## 2015-05-06 VITALS — BP 146/66 | HR 119 | Temp 98.6°F | Ht 61.0 in | Wt 199.0 lb

## 2015-05-06 DIAGNOSIS — IMO0001 Reserved for inherently not codable concepts without codable children: Secondary | ICD-10-CM

## 2015-05-06 DIAGNOSIS — Z23 Encounter for immunization: Secondary | ICD-10-CM

## 2015-05-06 DIAGNOSIS — E119 Type 2 diabetes mellitus without complications: Secondary | ICD-10-CM | POA: Diagnosis not present

## 2015-05-06 DIAGNOSIS — K760 Fatty (change of) liver, not elsewhere classified: Secondary | ICD-10-CM

## 2015-05-06 DIAGNOSIS — I10 Essential (primary) hypertension: Secondary | ICD-10-CM

## 2015-05-06 MED ORDER — NEBIVOLOL HCL 10 MG PO TABS: 10.0000 mg | ORAL_TABLET | Freq: Every day | ORAL | Status: DC

## 2015-05-06 NOTE — Progress Notes (Signed)
   Subjective:    Patient ID: Tanya Hogan, female    DOB: [birth year], 64 y.o.   MRN: [id number]  HPI Diabetes - no hypoglycemic events. No wounds or sores that are not healing well. No increased thirst or urination. Checking glucose at home. Taking medications as prescribed without any side effects. She has really scut the fat out of her diet.  She has lost 3 lbs.   Hypertension- Pt denies chest pain, SOB, dizziness, or heart palpitations.  Taking meds as directed w/o problems.  Denies medication side effects.    Obesity - she has really been trying to cut fat out of her diet.   NASH - she did see Hepatology and they have recommended a bx. She has really been trying to work on diet and weight loss.    Review of Systems     Objective:   Physical Exam  Constitutional: She is oriented to person, place, and time. She appears well-developed and well-nourished.  HENT:  Head: Normocephalic and atraumatic.  Cardiovascular: Normal rate, regular rhythm and normal heart sounds.   Pulmonary/Chest: Effort normal and breath sounds normal.  Neurological: She is alert and oriented to person, place, and time.  Skin: Skin is warm and dry.  Psychiatric: She has a normal mood and affect. Her behavior is normal.          Assessment & Plan:  DM -  Well controlled.  A1C is down to 6.4.  Continue current regimen.  On statin.  Follow-up in 3 months.   HTN - Uncontrolled. Increase bystolic to 95FM.  Repeat BP was better. reviewed low salt diet,  Weight  Loss and exercise.  F/U in 3 months.    Obesity/BMI 37  --she has lost about 3 pounds since I last saw her and is doing well overall. Continue work on diet and exercise.   NASH - planning bx with hepatology.

## 2015-05-08 DIAGNOSIS — J3081 Allergic rhinitis due to animal (cat) (dog) hair and dander: Secondary | ICD-10-CM | POA: Diagnosis not present

## 2015-05-08 DIAGNOSIS — J3089 Other allergic rhinitis: Secondary | ICD-10-CM | POA: Diagnosis not present

## 2015-05-08 DIAGNOSIS — J301 Allergic rhinitis due to pollen: Secondary | ICD-10-CM | POA: Diagnosis not present

## 2015-05-08 DIAGNOSIS — J4540 Moderate persistent asthma, uncomplicated: Secondary | ICD-10-CM | POA: Diagnosis not present

## 2015-05-08 DIAGNOSIS — L509 Urticaria, unspecified: Secondary | ICD-10-CM | POA: Diagnosis not present

## 2015-05-14 DIAGNOSIS — R7989 Other specified abnormal findings of blood chemistry: Secondary | ICD-10-CM | POA: Diagnosis not present

## 2015-05-14 DIAGNOSIS — Z9889 Other specified postprocedural states: Secondary | ICD-10-CM | POA: Diagnosis not present

## 2015-05-14 DIAGNOSIS — K7460 Unspecified cirrhosis of liver: Secondary | ICD-10-CM | POA: Diagnosis not present

## 2015-05-14 DIAGNOSIS — K7581 Nonalcoholic steatohepatitis (NASH): Secondary | ICD-10-CM | POA: Diagnosis not present

## 2015-05-20 DIAGNOSIS — R938 Abnormal findings on diagnostic imaging of other specified body structures: Secondary | ICD-10-CM | POA: Diagnosis not present

## 2015-05-20 DIAGNOSIS — J4540 Moderate persistent asthma, uncomplicated: Secondary | ICD-10-CM | POA: Diagnosis not present

## 2015-05-20 DIAGNOSIS — E119 Type 2 diabetes mellitus without complications: Secondary | ICD-10-CM | POA: Diagnosis not present

## 2015-05-20 DIAGNOSIS — Z9989 Dependence on other enabling machines and devices: Secondary | ICD-10-CM | POA: Diagnosis not present

## 2015-05-20 DIAGNOSIS — G4733 Obstructive sleep apnea (adult) (pediatric): Secondary | ICD-10-CM | POA: Diagnosis not present

## 2015-05-20 DIAGNOSIS — E079 Disorder of thyroid, unspecified: Secondary | ICD-10-CM | POA: Diagnosis not present

## 2015-05-20 DIAGNOSIS — R05 Cough: Secondary | ICD-10-CM | POA: Diagnosis not present

## 2015-05-20 DIAGNOSIS — Z87891 Personal history of nicotine dependence: Secondary | ICD-10-CM | POA: Diagnosis not present

## 2015-05-20 DIAGNOSIS — R932 Abnormal findings on diagnostic imaging of liver and biliary tract: Secondary | ICD-10-CM | POA: Diagnosis not present

## 2015-05-21 DIAGNOSIS — R32 Unspecified urinary incontinence: Secondary | ICD-10-CM | POA: Diagnosis not present

## 2015-05-26 ENCOUNTER — Encounter: Payer: Self-pay | Admitting: Family Medicine

## 2015-05-26 DIAGNOSIS — K7581 Nonalcoholic steatohepatitis (NASH): Secondary | ICD-10-CM | POA: Insufficient documentation

## 2015-06-03 ENCOUNTER — Encounter: Payer: Self-pay | Admitting: Internal Medicine

## 2015-06-03 ENCOUNTER — Ambulatory Visit (INDEPENDENT_AMBULATORY_CARE_PROVIDER_SITE_OTHER): Payer: Medicare Other | Admitting: *Deleted

## 2015-06-03 DIAGNOSIS — I442 Atrioventricular block, complete: Secondary | ICD-10-CM

## 2015-06-03 NOTE — Progress Notes (Signed)
Remote pacemaker transmission.   

## 2015-06-06 LAB — CUP PACEART REMOTE DEVICE CHECK
Battery Remaining Longevity: 68 mo
Brady Statistic AP VP Percent: 47.41 %
Brady Statistic AP VS Percent: 0.03 %
Brady Statistic AS VP Percent: 52.33 %
Brady Statistic AS VS Percent: 0.23 %
Implantable Lead Implant Date: 20030324
Implantable Lead Location: 753858
Implantable Lead Model: 4068
Lead Channel Impedance Value: 1292 Ohm
Lead Channel Impedance Value: 4047 Ohm
Lead Channel Impedance Value: 4047 Ohm
Lead Channel Impedance Value: 4047 Ohm
Lead Channel Pacing Threshold Amplitude: 0.875 V
Lead Channel Pacing Threshold Pulse Width: 0.4 ms
Lead Channel Sensing Intrinsic Amplitude: 1.25 mV
Lead Channel Sensing Intrinsic Amplitude: 10.5 mV
Lead Channel Setting Pacing Amplitude: 3.5 V
Lead Channel Setting Pacing Pulse Width: 0.4 ms
Lead Channel Setting Sensing Sensitivity: 4 mV
MDC IDC LEAD IMPLANT DT: 20030324
MDC IDC LEAD IMPLANT DT: 20030324
MDC IDC LEAD LOCATION: 753859
MDC IDC LEAD LOCATION: 753860
MDC IDC MSMT BATTERY VOLTAGE: 3.01 V
MDC IDC MSMT LEADCHNL LV IMPEDANCE VALUE: 1121 Ohm
MDC IDC MSMT LEADCHNL RA IMPEDANCE VALUE: 418 Ohm
MDC IDC MSMT LEADCHNL RA IMPEDANCE VALUE: 456 Ohm
MDC IDC MSMT LEADCHNL RA SENSING INTR AMPL: 1.25 mV
MDC IDC MSMT LEADCHNL RV IMPEDANCE VALUE: 475 Ohm
MDC IDC MSMT LEADCHNL RV IMPEDANCE VALUE: 608 Ohm
MDC IDC MSMT LEADCHNL RV PACING THRESHOLD AMPLITUDE: 1.625 V
MDC IDC MSMT LEADCHNL RV PACING THRESHOLD PULSEWIDTH: 0.4 ms
MDC IDC MSMT LEADCHNL RV SENSING INTR AMPL: 10.5 mV
MDC IDC SESS DTM: 20161010151648
MDC IDC SET LEADCHNL LV PACING AMPLITUDE: 2.25 V
MDC IDC SET LEADCHNL LV PACING PULSEWIDTH: 0.8 ms
MDC IDC SET LEADCHNL RA PACING AMPLITUDE: 1.5 V
MDC IDC SET ZONE DETECTION INTERVAL: 400 ms
MDC IDC SET ZONE DETECTION INTERVAL: 400 ms
MDC IDC STAT BRADY RA PERCENT PACED: 47.45 %
MDC IDC STAT BRADY RV PERCENT PACED: 99.74 %

## 2015-06-09 ENCOUNTER — Telehealth: Payer: Self-pay | Admitting: Hematology and Oncology

## 2015-06-09 NOTE — Telephone Encounter (Signed)
s.w. pt and r/s 3.8 appt to 3.16 due to md on pal...pt ok and aware

## 2015-06-21 ENCOUNTER — Encounter: Payer: Self-pay | Admitting: Cardiology

## 2015-06-24 ENCOUNTER — Other Ambulatory Visit: Payer: Self-pay | Admitting: Family Medicine

## 2015-07-04 DIAGNOSIS — I110 Hypertensive heart disease with heart failure: Secondary | ICD-10-CM | POA: Diagnosis not present

## 2015-07-04 DIAGNOSIS — Z950 Presence of cardiac pacemaker: Secondary | ICD-10-CM | POA: Diagnosis not present

## 2015-07-04 DIAGNOSIS — J449 Chronic obstructive pulmonary disease, unspecified: Secondary | ICD-10-CM | POA: Diagnosis not present

## 2015-07-04 DIAGNOSIS — I509 Heart failure, unspecified: Secondary | ICD-10-CM | POA: Diagnosis not present

## 2015-07-04 DIAGNOSIS — E119 Type 2 diabetes mellitus without complications: Secondary | ICD-10-CM | POA: Diagnosis not present

## 2015-07-04 DIAGNOSIS — K7581 Nonalcoholic steatohepatitis (NASH): Secondary | ICD-10-CM | POA: Diagnosis not present

## 2015-07-22 DIAGNOSIS — R197 Diarrhea, unspecified: Secondary | ICD-10-CM | POA: Diagnosis not present

## 2015-07-22 DIAGNOSIS — K219 Gastro-esophageal reflux disease without esophagitis: Secondary | ICD-10-CM | POA: Diagnosis not present

## 2015-07-22 DIAGNOSIS — K7581 Nonalcoholic steatohepatitis (NASH): Secondary | ICD-10-CM | POA: Diagnosis not present

## 2015-08-01 DIAGNOSIS — D3131 Benign neoplasm of right choroid: Secondary | ICD-10-CM | POA: Diagnosis not present

## 2015-08-01 LAB — HM DIABETES EYE EXAM

## 2015-08-05 ENCOUNTER — Ambulatory Visit (INDEPENDENT_AMBULATORY_CARE_PROVIDER_SITE_OTHER): Payer: Medicare Other | Admitting: Family Medicine

## 2015-08-05 ENCOUNTER — Encounter: Payer: Self-pay | Admitting: Family Medicine

## 2015-08-05 VITALS — BP 156/84 | HR 89 | Temp 98.2°F | Wt 202.0 lb

## 2015-08-05 DIAGNOSIS — K7581 Nonalcoholic steatohepatitis (NASH): Secondary | ICD-10-CM | POA: Diagnosis not present

## 2015-08-05 DIAGNOSIS — R1310 Dysphagia, unspecified: Secondary | ICD-10-CM

## 2015-08-05 DIAGNOSIS — I10 Essential (primary) hypertension: Secondary | ICD-10-CM

## 2015-08-05 DIAGNOSIS — E118 Type 2 diabetes mellitus with unspecified complications: Secondary | ICD-10-CM | POA: Diagnosis not present

## 2015-08-05 LAB — POCT GLYCOSYLATED HEMOGLOBIN (HGB A1C)
Hemoglobin A1C: 6.3
Hemoglobin A1C: 6.4

## 2015-08-05 LAB — POCT UA - MICROALBUMIN
CREATININE, POC: 300 mg/dL
Microalbumin Ur, POC: 150 mg/L

## 2015-08-05 NOTE — Patient Instructions (Signed)
The breast [place] for you bone density scan.

## 2015-08-05 NOTE — Progress Notes (Signed)
   Subjective:    Patient ID: Tanya Hogan, female    DOB: Jan 03, 1951, 64 y.o.   MRN: [id number]  HPI Diabetes - no hypoglycemic events. No wounds or sores that are not healing well. No increased thirst or urination. Checking glucose at home. Taking medications as prescribed without any side effects.  Hypertension- Pt denies chest pain, SOB, dizziness, or heart palpitations.  Taking meds as directed w/o problems.  Denies medication side effects.  She forgot to take her meds this mornig. Was sick for the last 3 days and feels better today.    Feels like food is getting hung in her throat. Says yesterday she vomited up a red potato skin.  Hasn't felt well for 3 days.     Review of Systems     Objective:   Physical Exam  Constitutional: She is oriented to person, place, and time. She appears well-developed and well-nourished.  HENT:  Head: Normocephalic and atraumatic.  Cardiovascular: Normal rate, regular rhythm and normal heart sounds.   Pulmonary/Chest: Effort normal and breath sounds normal.  Abdominal: Soft. Bowel sounds are normal. She exhibits no distension and no mass. There is no tenderness. There is no rebound and no guarding.  Neurological: She is alert and oriented to person, place, and time.  Skin: Skin is warm and dry.  Psychiatric: She has a normal mood and affect. Her behavior is normal.          Assessment & Plan:  DM-  well- controlled continue current regimen.A1C of 6.3 today.  Continue current regimen. Follow-up in 3 months.  HTN -  Uncontrolled. She did not take her medication this morning because she s getting ever feelin sick fr the last couple of days. Encouraged her to make sure that she'staking it regularly and we will recheck again at her follow-up in 3 months.   dysphagia - we'll refer back to GI for possible endoscopy it sounds like this is been somewhat recurrent. She complains about it getting stuck in her lower chest area just above the stomach. She  follows with Juanda Crumble , PA for digestive health.  NASH - just had biopsy back in September but hasn't heard back the rsults. Review resulst in My Chart and went over those with her.

## 2015-08-12 DIAGNOSIS — K7581 Nonalcoholic steatohepatitis (NASH): Secondary | ICD-10-CM | POA: Diagnosis not present

## 2015-08-12 DIAGNOSIS — K219 Gastro-esophageal reflux disease without esophagitis: Secondary | ICD-10-CM | POA: Diagnosis not present

## 2015-08-12 DIAGNOSIS — R1310 Dysphagia, unspecified: Secondary | ICD-10-CM | POA: Diagnosis not present

## 2015-08-13 DIAGNOSIS — K210 Gastro-esophageal reflux disease with esophagitis: Secondary | ICD-10-CM | POA: Diagnosis not present

## 2015-08-13 DIAGNOSIS — R1310 Dysphagia, unspecified: Secondary | ICD-10-CM | POA: Diagnosis not present

## 2015-08-13 DIAGNOSIS — K449 Diaphragmatic hernia without obstruction or gangrene: Secondary | ICD-10-CM | POA: Diagnosis not present

## 2015-08-13 DIAGNOSIS — K219 Gastro-esophageal reflux disease without esophagitis: Secondary | ICD-10-CM | POA: Diagnosis not present

## 2015-08-22 DIAGNOSIS — G4733 Obstructive sleep apnea (adult) (pediatric): Secondary | ICD-10-CM | POA: Diagnosis not present

## 2015-08-27 ENCOUNTER — Other Ambulatory Visit: Payer: Self-pay | Admitting: Family Medicine

## 2015-08-28 ENCOUNTER — Other Ambulatory Visit: Payer: Self-pay | Admitting: Family Medicine

## 2015-08-28 DIAGNOSIS — Z853 Personal history of malignant neoplasm of breast: Secondary | ICD-10-CM

## 2015-08-29 ENCOUNTER — Other Ambulatory Visit: Payer: Self-pay

## 2015-08-29 ENCOUNTER — Other Ambulatory Visit: Payer: Self-pay | Admitting: Family Medicine

## 2015-08-29 DIAGNOSIS — Z853 Personal history of malignant neoplasm of breast: Secondary | ICD-10-CM

## 2015-09-04 ENCOUNTER — Ambulatory Visit
Admission: RE | Admit: 2015-09-04 | Discharge: 2015-09-04 | Disposition: A | Discharge status: Home / Self Care | Payer: Medicare Other | Source: Ambulatory Visit | Attending: Family Medicine | Admitting: Family Medicine

## 2015-09-04 ENCOUNTER — Other Ambulatory Visit: Payer: Self-pay | Admitting: Family Medicine

## 2015-09-04 DIAGNOSIS — M8588 Other specified disorders of bone density and structure, other site: Secondary | ICD-10-CM | POA: Diagnosis not present

## 2015-09-04 DIAGNOSIS — E2839 Other primary ovarian failure: Secondary | ICD-10-CM

## 2015-09-04 DIAGNOSIS — Z853 Personal history of malignant neoplasm of breast: Secondary | ICD-10-CM

## 2015-09-04 DIAGNOSIS — R928 Other abnormal and inconclusive findings on diagnostic imaging of breast: Secondary | ICD-10-CM | POA: Diagnosis not present

## 2015-09-04 DIAGNOSIS — S728X9A Other fracture of unspecified femur, initial encounter for closed fracture: Secondary | ICD-10-CM | POA: Diagnosis not present

## 2015-09-05 DIAGNOSIS — G4733 Obstructive sleep apnea (adult) (pediatric): Secondary | ICD-10-CM | POA: Diagnosis not present

## 2015-09-30 ENCOUNTER — Encounter: Payer: Self-pay | Admitting: Internal Medicine

## 2015-09-30 ENCOUNTER — Ambulatory Visit (INDEPENDENT_AMBULATORY_CARE_PROVIDER_SITE_OTHER): Payer: Medicare Other | Admitting: Internal Medicine

## 2015-09-30 VITALS — BP 140/80 | HR 80 | Ht 61.0 in | Wt 205.0 lb

## 2015-09-30 DIAGNOSIS — I442 Atrioventricular block, complete: Secondary | ICD-10-CM

## 2015-09-30 LAB — CUP PACEART INCLINIC DEVICE CHECK
Brady Statistic AP VP Percent: 41.61 %
Brady Statistic AS VP Percent: 57.75 %
Brady Statistic RA Percent Paced: 41.65 %
Implantable Lead Implant Date: 20030324
Implantable Lead Implant Date: 20030324
Implantable Lead Location: 753858
Implantable Lead Location: 753860
Implantable Lead Model: 4068
Lead Channel Impedance Value: 1235 Ohm
Lead Channel Impedance Value: 4047 Ohm
Lead Channel Impedance Value: 4047 Ohm
Lead Channel Impedance Value: 437 Ohm
Lead Channel Impedance Value: 589 Ohm
Lead Channel Pacing Threshold Pulse Width: 0.4 ms
Lead Channel Pacing Threshold Pulse Width: 0.4 ms
Lead Channel Sensing Intrinsic Amplitude: 1.9 mV
Lead Channel Setting Pacing Amplitude: 1.5 V
Lead Channel Setting Pacing Pulse Width: 0.4 ms
Lead Channel Setting Sensing Sensitivity: 4 mV
MDC IDC LEAD IMPLANT DT: 20030324
MDC IDC LEAD LOCATION: 753859
MDC IDC MSMT BATTERY REMAINING LONGEVITY: 61 mo
MDC IDC MSMT BATTERY VOLTAGE: 3.01 V
MDC IDC MSMT LEADCHNL LV IMPEDANCE VALUE: 1045 Ohm
MDC IDC MSMT LEADCHNL LV IMPEDANCE VALUE: 4047 Ohm
MDC IDC MSMT LEADCHNL LV PACING THRESHOLD AMPLITUDE: 1.25 V
MDC IDC MSMT LEADCHNL RA IMPEDANCE VALUE: 399 Ohm
MDC IDC MSMT LEADCHNL RA PACING THRESHOLD AMPLITUDE: 0.5 V
MDC IDC MSMT LEADCHNL RV IMPEDANCE VALUE: 418 Ohm
MDC IDC MSMT LEADCHNL RV PACING THRESHOLD AMPLITUDE: 1.75 V
MDC IDC MSMT LEADCHNL RV PACING THRESHOLD PULSEWIDTH: 0.4 ms
MDC IDC SESS DTM: 20170206145237
MDC IDC SET LEADCHNL LV PACING AMPLITUDE: 2.25 V
MDC IDC SET LEADCHNL LV PACING PULSEWIDTH: 0.8 ms
MDC IDC SET LEADCHNL RV PACING AMPLITUDE: 4.25 V
MDC IDC STAT BRADY AP VS PERCENT: 0.04 %
MDC IDC STAT BRADY AS VS PERCENT: 0.61 %
MDC IDC STAT BRADY RV PERCENT PACED: 99.36 %

## 2015-09-30 NOTE — Progress Notes (Signed)
HPI Mrs. Trabert returns today for followup. She is pleasant 65 year old woman with a history of obesity, hypertension, sleep apnea, chronic systolic heart failure, status post biventricular pacemaker insertion. She denies chest pain, shortness of breath, or peripheral edema. No syncope. She was unable take ACE inhibitors or ARB's secondary to hyperkalemia.  She denies chest pain. She has class II heart failure symptoms. She denies peripheral edema. No syncope or palpitations.  The patient was found to have an active recall PM and is PM dependent. She has undergone biventricular pacemaker insertion. No complaints. She is frustrated by her inability to lose weight.  Allergies  Allergen Reactions  . Penicillins Anaphylaxis  . Accupril [Quinapril Hcl] Other (See Comments)    Hyperkalemia  . Aspirin Other (See Comments)    Pt states "burns holes in stomach"  . Baking Soda-Fluoride [Sodium Fluoride] Itching  . Codeine Nausea And Vomiting  . Fluticasone Other (See Comments)    Nasal ulcer.    . Furosemide Other (See Comments)    Nose bleed  . Lactose Intolerance (Gi) Other (See Comments)    Increased calcium, sneezing, nasal issues  . Losartan Potassium Itching  . Metformin And Related Diarrhea  . Nasonex [Mometasone Furoate] Other (See Comments)    nosebleed  . Quinine     unknown  . Rifampin Other (See Comments)    DOES NOT REMEMBER THIS MEDICATION OR ALLERGY  . Clindamycin/Lincomycin Rash  . Miralax [Polyethylene Glycol] Rash  . Sulfa Antibiotics Rash  . Sulfamethoxazole Rash  . Sulfonamide Derivatives Rash  . Zoledronic Acid Other (See Comments) and Rash    shaking     Current Outpatient Prescriptions  Medication Sig Dispense Refill  . acetaminophen (TYLENOL) 325 MG tablet Take 650 mg by mouth.    . AMBULATORY NON FORMULARY MEDICATION Medication Name: Needs tubing and suplies for her nebulizer machine. Dx COPD. [facility] 1 vial 0  . bumetanide (BUMEX) 1 MG tablet Take 1  tablet by mouth two  times daily 180 tablet 1  . BYSTOLIC 10 MG tablet Take 1 tablet by mouth  daily 90 tablet 2  . dicyclomine (BENTYL) 20 MG tablet Take 10-20 mg by mouth every 8 (eight) hours as needed for spasms. Take 1/2-1 tablet by mouth every eights hours as needed for cramps  11  . diltiazem (CARDIZEM) 120 MG tablet Take 120 mg by mouth daily.     . DULoxetine (CYMBALTA) 30 MG capsule Take 30 mg by mouth daily.     . Fluticasone-Salmeterol (ADVAIR) 250-50 MCG/DOSE AEPB Frequency:two times daily   Dosage:250-50   MCG/DOSE  Instructions:Advair Diskus (250-50MCG/DOSE MISC, 2  puff  Inhalation two times daily)  Note:    . Insulin Glargine (LANTUS SOLOSTAR) 100 UNIT/ML Solostar Pen Inject 10 Units into the skin daily at 10 pm. (Patient taking differently: Inject 20 Units into the skin daily at 10 pm. ) 15 mL 5  . ketotifen (ALAWAY) 0.025 % ophthalmic solution 1 drop 2 (two) times daily.    [name] levothyroxine (SYNTHROID, LEVOTHROID) 75 MCG tablet Take 1 tablet by mouth  daily 90 tablet 0  . mirabegron ER (MYRBETRIQ) 50 MG TB24 tablet Take 50 mg by mouth daily.    . pantoprazole (PROTONIX) 40 MG tablet Take 40 mg by mouth daily.   11  . PROAIR HFA 108 (90 BASE) MCG/ACT inhaler Use 2 puffs every 4 hours  as needed 17 g 2  . SINGULAIR 10 MG tablet Take 1 tablet by mouth  daily  90 tablet 2  . SPIRIVA HANDIHALER 18 MCG inhalation capsule Inhale the contents of 1  capsule via HandiHaler  daily 30 capsule 0  . traZODone (DESYREL) 50 MG tablet Take 50 mg by mouth.    . AMBULATORY NON FORMULARY MEDICATION Fastclix lancets Dx: E11.9 300 each 3  . Blood Glucose Monitoring Suppl (ACCU-CHEK NANO SMARTVIEW) W/DEVICE KIT Test two times daily 1 kit 1  . glucose blood (ACCU-CHEK SMARTVIEW) test strip Test two times daily 100 each 11  . Insulin Pen Needle (BD PEN NEEDLE NANO U/F) 32G X 4 MM MISC Test two times daily 100 each 11  . Lancets Misc. (ACCU-CHEK FASTCLIX LANCET) KIT Test two times daily. 3 kit 11   No  current facility-administered medications for this visit.     Past Medical History  Diagnosis Date  . HTN (hypertension)   . CHF (congestive heart failure) ([place])   . LBBB (left bundle branch block)   . Fibromyalgia   . Alkaline phosphatase elevation   . Hypothyroidism   . Closed fracture of unspecified part of radius (alone)   . Depression   . DDD (degenerative disc disease), lumbar   . Carpal tunnel syndrome   . IBS (irritable bowel syndrome)   . Breast cancer ([place]) 2011    ROS:   All systems reviewed and negative except as noted in the HPI.   Past Surgical History  Procedure Laterality Date  . Pacemaker placement  2009    [facility] cardiology  . Knee surgery    . Foot surgery    . Tubal ligation    . Breast lumpectomy    . Pacemaker generator change N/A 08/23/2014    Procedure: PACEMAKER GENERATOR CHANGE;  Surgeon: Evans Lance, MD;  Location: [facility] CATH LAB;  Service: Cardiovascular;  Laterality: N/A;     Family History  Problem Relation Age of Onset  . Heart failure    . Breast cancer    . Hypertension    . Melanoma Mother      Social History   Social History  . Marital Status: Divorced    Spouse Name: N/A  . Number of Children: N/A  . Years of Education: N/A   Occupational History  . Not on file.   Social History Main Topics  . Smoking status: Former Smoker    Quit date: 08/24/1966  . Smokeless tobacco: Not on file  . Alcohol Use: No  . Drug Use: No  . Sexual Activity: Not Currently   Other Topics Concern  . Not on file   Social History Narrative     BP 140/80 mmHg  Pulse 80  Ht 5' 1"  (1.549 m)  Wt 205 lb (92.987 kg)  BMI 38.75 kg/m2  Physical Exam:  Well appearing 76 old woman, NAD HEENT: Unremarkable Neck:   7 cm JVD, no thyromegally Lungs:  Clear with no wheezes, rales, or rhonchi. HEART:  Regular rate rhythm, no murmurs, no rubs, no clicks Abd:  soft, positive bowel sounds, no organomegally, no rebound, no guarding Ext:  2  plus pulses, no edema, no cyanosis, no clubbing Skin:  No rashes no nodules Neuro:  CN II through XII intact, motor grossly intact  ECG - NSR with BiV pacing  DEVICE  Normal device function.  See PaceArt for details.   Assess/Plan:  1. Chronic systolic heart failure - her symptoms are class 2. She will continue her current meds. 2. HTN heart disease - her blood pressure is good. Will follow.  3. BiV PPM - she is PM dependent from [facility]. Her St[employer] Jude device is working normally. Will follow.

## 2015-09-30 NOTE — Patient Instructions (Signed)
Medication Instructions:  Your physician recommends that you continue on your current medications as directed. Please refer to the Current Medication list given to you today.  Labwork: None ordered  Testing/Procedures: None ordered  Follow-Up: Remote monitoring is used to monitor your Pacemaker of ICD from home. This monitoring reduces the number of office visits required to check your device to one time per year. It allows [place] to keep an eye on the functioning of your device to ensure it is working properly. You are scheduled for a device check from home on 12/30/2015. You may send your transmission at any time that day. If you have a wireless device, the transmission will be sent automatically. After your physician reviews your transmission, you will receive a postcard with your next transmission date.  Your physician wants you to follow-up in: 1 year with Dr. Lovena Le. You will receive a reminder letter in the mail two months in advance. If you don't receive a letter, please call our office to schedule the follow-up appointment.  Any Other Special Instructions Will Be Listed Below (If Applicable).  If you need a refill on your cardiac medications before your next appointment, please call your pharmacy.  Thank you for choosing [facility]!!

## 2015-10-16 ENCOUNTER — Other Ambulatory Visit: Payer: Self-pay | Admitting: Family Medicine

## 2015-10-18 ENCOUNTER — Other Ambulatory Visit: Payer: Self-pay | Admitting: Family Medicine

## 2015-10-31 ENCOUNTER — Ambulatory Visit: Payer: Medicare Other | Admitting: Hematology and Oncology

## 2015-11-04 ENCOUNTER — Ambulatory Visit (INDEPENDENT_AMBULATORY_CARE_PROVIDER_SITE_OTHER): Payer: Medicare Other | Admitting: Family Medicine

## 2015-11-04 ENCOUNTER — Encounter: Payer: Self-pay | Admitting: Family Medicine

## 2015-11-04 VITALS — BP 120/61 | HR 83 | Wt 208.0 lb

## 2015-11-04 DIAGNOSIS — K760 Fatty (change of) liver, not elsewhere classified: Secondary | ICD-10-CM | POA: Diagnosis not present

## 2015-11-04 DIAGNOSIS — J449 Chronic obstructive pulmonary disease, unspecified: Secondary | ICD-10-CM

## 2015-11-04 DIAGNOSIS — I10 Essential (primary) hypertension: Secondary | ICD-10-CM | POA: Diagnosis not present

## 2015-11-04 DIAGNOSIS — E119 Type 2 diabetes mellitus without complications: Secondary | ICD-10-CM

## 2015-11-04 DIAGNOSIS — E039 Hypothyroidism, unspecified: Secondary | ICD-10-CM

## 2015-11-04 LAB — POCT GLYCOSYLATED HEMOGLOBIN (HGB A1C): Hemoglobin A1C: 6.1

## 2015-11-04 MED ORDER — PREDNISONE 20 MG PO TABS: 40.0000 mg | ORAL_TABLET | Freq: Every day | ORAL | Status: DC

## 2015-11-04 MED ORDER — AZITHROMYCIN 250 MG PO TABS: ORAL_TABLET | ORAL | Status: AC

## 2015-11-04 NOTE — Progress Notes (Signed)
   Subjective:    Patient ID: Tanya Hogan, female    DOB: 03-15-1951, 65 y.o.   MRN: [id number]  HPI  Diabetes - no hypoglycemic events. No wounds or sores that are not healing well. No increased thirst or urination. Checking glucose at home. Taking medications as prescribed without any side effects. Sugars running in the 120s in the AM.   Hypertension- Pt denies chest pain, SOB, dizziness, or heart palpitations.  Taking meds as directed w/o problems.  Denies medication side effects.    Fatty liver - she has enrolled in a liver study and starts this week.    Hypothyroid - No recent skin or hair changes.  She is on levothyroxine 44mg.    COPD - has been using her rescue inhaler a little more. Has had nasal congestion and cough x 1.5 weeks. She is coughing up a thick sputm. Has had to use her albuterol a little more No SOB though.    Review of Systems     Objective:   Physical Exam  Constitutional: She is oriented to person, place, and time. She appears well-developed and well-nourished.  HENT:  Head: Normocephalic and atraumatic.  Neck: Neck supple. No thyromegaly present.  Cardiovascular: Normal rate, regular rhythm and normal heart sounds.   Pulmonary/Chest: Effort normal and breath sounds normal.  Lymphadenopathy:    She has no cervical adenopathy.  Neurological: She is alert and oriented to person, place, and time.  Skin: Skin is warm and dry.  Psychiatric: She has a normal mood and affect. Her behavior is normal.          Assessment & Plan:  DM- well controlled. F/U in 3 months.  I examined foot exam are up-to-date. She is not on a statin. Lab Results  Component Value Date   HGBA1C 6.1 11/04/2015   Fatty liver - due to recheck liver enzymes. Will be starting a new investigational study with her liver.  Hypothyroid - doing well.  Check TSH. Continue current regimen.   HTN  - Well-controlled. Continue current regimen. Follow-up in 3 months.  COPD exacrebation -  continue Spiriva and Advair.  Tx with zpack and prednisone. She has follow-up with pulmonology in the next few weeks..[name]

## 2015-11-05 ENCOUNTER — Other Ambulatory Visit: Payer: Self-pay | Admitting: Family Medicine

## 2015-11-05 LAB — COMPLETE METABOLIC PANEL WITH GFR
ALT: 22 U/L (ref 6–29)
AST: 34 U/L (ref 10–35)
Albumin: 4.2 g/dL (ref 3.6–5.1)
Alkaline Phosphatase: 106 U/L (ref 33–130)
BUN: 16 mg/dL (ref 7–25)
CO2: 26 mmol/L (ref 20–31)
Calcium: 9.8 mg/dL (ref 8.6–10.4)
Chloride: 104 mmol/L (ref 98–110)
Creat: 0.71 mg/dL (ref 0.50–0.99)
GFR, EST NON AFRICAN AMERICAN: 89 mL/min (ref >=60)
Glucose, Bld: 119 mg/dL — ABNORMAL HIGH (ref 65–99)
POTASSIUM: 4.7 mmol/L (ref 3.5–5.3)
SODIUM: 139 mmol/L (ref 135–146)
Total Bilirubin: 0.4 mg/dL (ref 0.2–1.2)
Total Protein: 7.1 g/dL (ref 6.1–8.1)

## 2015-11-05 LAB — TSH: TSH: 1.12 m[IU]/L

## 2015-11-07 ENCOUNTER — Telehealth: Payer: Self-pay | Admitting: Hematology and Oncology

## 2015-11-07 ENCOUNTER — Ambulatory Visit (HOSPITAL_BASED_OUTPATIENT_CLINIC_OR_DEPARTMENT_OTHER): Payer: Medicare Other | Admitting: Hematology and Oncology

## 2015-11-07 ENCOUNTER — Encounter: Payer: Self-pay | Admitting: Hematology and Oncology

## 2015-11-07 VITALS — BP 122/62 | HR 74 | Temp 97.8°F | Resp 18 | Ht 61.0 in | Wt 209.6 lb

## 2015-11-07 DIAGNOSIS — Z853 Personal history of malignant neoplasm of breast: Secondary | ICD-10-CM | POA: Diagnosis not present

## 2015-11-07 DIAGNOSIS — C50212 Malignant neoplasm of upper-inner quadrant of left female breast: Secondary | ICD-10-CM

## 2015-11-07 MED ORDER — INSULIN GLARGINE 100 UNIT/ML SOLOSTAR PEN: 20.0000 [IU] | PEN_INJECTOR | Freq: Every day | SUBCUTANEOUS | Status: DC

## 2015-11-07 NOTE — Progress Notes (Signed)
Patient Care Team: Hali Marry, MD as PCP - General (Family Medicine) Consuela Mimes, MD (Hematology and Oncology) Lelon Perla, MD as Attending Physician (Cardiology) Evans Lance, MD (Cardiology) Jacques Navy, MD (Neurology) Dr. Caprice Kluver (Otolaryngology) Amada Kingfisher, MD as Consulting Physician (Hematology and Oncology) Gerre Pebbles, MD as Referring Physician (Gastroenterology)  SUMMARY OF ONCOLOGIC HISTORY:   Breast cancer of upper-inner quadrant of left female breast ([place])   04/22/2010 Surgery Left breast lumpectomy: 0.5 cm invasive ductal carcinoma ER positive PR positive HER-2 negative, T1 N0 M0 stage IA   04/24/2010 - 05/02/2010 Radiation Therapy Partial breast radiation   07/01/2010 - 07/10/2015 Anti-estrogen oral therapy Tamoxifen 20 mg daily 5 years    CHIEF COMPLIANT: Completed tamoxifen therapy  INTERVAL HISTORY: Tanya Hogan is a 65 year old with above-mentioned history of left breast cancer who had lumpectomy followed by radiation 5 years of tamoxifen therapy. She is here for annual follow-up and reports no major problems other than the new diagnosis of fatty liver with cirrhosis. She is going to be on a clinical trial medication to decrease the fatty liver and cirrhosis. The trial also has an observation arm. She is not sure which arm she is going to be on. Denies any lumps or nodules in the breasts. She does have fibromyalgia and does have achiness in her muscles and joints.  REVIEW OF SYSTEMS:   Constitutional: Denies fevers, chills or abnormal weight loss Eyes: Denies blurriness of vision Ears, nose, mouth, throat, and face: Denies mucositis or sore throat Respiratory: Denies cough, dyspnea or wheezes Cardiovascular: Denies palpitation, chest discomfort Gastrointestinal:  Denies nausea, heartburn or change in bowel habits Skin: Denies abnormal skin rashes Lymphatics: Denies new lymphadenopathy or easy bruising Neurological:Denies numbness,  tingling or new weaknesses Behavioral/Psych: Mood is stable, no new changes  Extremities: No lower extremity edema Breast:  denies any pain or lumps or nodules in either breasts All other systems were reviewed with the patient and are negative.  I have reviewed the past medical history, past surgical history, social history and family history with the patient and they are unchanged from previous note.  ALLERGIES:  is allergic to penicillins; accupril; aspirin; baking soda-fluoride; codeine; fluticasone; furosemide; lactose intolerance (gi); losartan potassium; metformin and related; nasonex; quinine; rifampin; clindamycin/lincomycin; miralax; sulfa antibiotics; sulfamethoxazole; sulfonamide derivatives; and zoledronic acid.  MEDICATIONS:  Current Outpatient Prescriptions  Medication Sig Dispense Refill  . acetaminophen (TYLENOL) 325 MG tablet Take 650 mg by mouth.    . AMBULATORY NON FORMULARY MEDICATION Medication Name: Needs tubing and suplies for her nebulizer machine. Dx COPD. [facility] 1 vial 0  . AMBULATORY NON FORMULARY MEDICATION Fastclix lancets Dx: E11.9 300 each 3  . azithromycin (ZITHROMAX) 250 MG tablet 2 Ttabs PO on Day 1, then one a day x 4 days. 6 tablet 0  . Blood Glucose Monitoring Suppl (ACCU-CHEK NANO SMARTVIEW) W/DEVICE KIT Test two times daily 1 kit 1  . bumetanide (BUMEX) 1 MG tablet Take 1 tablet by mouth two  times daily 180 tablet 1  . BYSTOLIC 10 MG tablet Take 1 tablet by mouth  daily 90 tablet 2  . dicyclomine (BENTYL) 20 MG tablet Take 10-20 mg by mouth every 8 (eight) hours as needed for spasms. Take 1/2-1 tablet by mouth every eights hours as needed for cramps  11  . diltiazem (CARDIZEM) 120 MG tablet Take 120 mg by mouth daily.     . DULoxetine (CYMBALTA) 30 MG capsule Take 30  mg by mouth daily.     . Fluticasone-Salmeterol (ADVAIR) 250-50 MCG/DOSE AEPB Frequency:two times daily   Dosage:250-50   MCG/DOSE  Instructions:Advair Diskus (250-50MCG/DOSE  MISC, 2  puff  Inhalation two times daily)  Note:    . glucose blood (ACCU-CHEK SMARTVIEW) test strip Test two times daily 100 each 11  . Insulin Glargine (LANTUS SOLOSTAR) 100 UNIT/ML Solostar Pen Inject 10 Units into the skin daily at 10 pm. (Patient taking differently: Inject 20 Units into the skin daily at 10 pm. ) 15 mL 5  . Insulin Pen Needle (BD PEN NEEDLE NANO U/F) 32G X 4 MM MISC Test two times daily 100 each 11  . ketotifen (ALAWAY) 0.025 % ophthalmic solution 1 drop 2 (two) times daily.    . Lancets Misc. (ACCU-CHEK FASTCLIX LANCET) KIT Test two times daily. 3 kit 11  . levothyroxine (SYNTHROID, LEVOTHROID) 75 MCG tablet Take 1 tablet by mouth  daily 90 tablet 0  . mirabegron ER (MYRBETRIQ) 50 MG TB24 tablet Take 50 mg by mouth daily.    . pantoprazole (PROTONIX) 40 MG tablet Take 40 mg by mouth daily.   11  . predniSONE (DELTASONE) 20 MG tablet Take 2 tablets (40 mg total) by mouth daily. 10 tablet 0  . PROAIR HFA 108 (90 BASE) MCG/ACT inhaler Use 2 puffs every 4 hours  as needed 17 g 2  . SINGULAIR 10 MG tablet Take 1 tablet by mouth  daily 90 tablet 2  . SPIRIVA HANDIHALER 18 MCG inhalation capsule Inhale the contents of 1  capsule via HandiHaler  daily 30 capsule 0  . traZODone (DESYREL) 50 MG tablet Take 50 mg by mouth.     No current facility-administered medications for this visit.    PHYSICAL EXAMINATION: ECOG PERFORMANCE STATUS: 0 - Asymptomatic  Filed Vitals:   11/07/15 1056  BP: 122/62  Pulse: 74  Temp: 97.8 F (36.6 C)  Resp: 18   Filed Weights   11/07/15 1056  Weight: 209 lb 9.6 oz (95.074 kg)    GENERAL:alert, no distress and comfortable SKIN: skin color, texture, turgor are normal, no rashes or significant lesions EYES: normal, Conjunctiva are pink and non-injected, sclera clear OROPHARYNX:no exudate, no erythema and lips, buccal mucosa, and tongue normal  NECK: supple, thyroid normal size, non-tender, without nodularity LYMPH:  no palpable  lymphadenopathy in the cervical, axillary or inguinal LUNGS: clear to auscultation and percussion with normal breathing effort HEART: regular rate & rhythm and no murmurs and no lower extremity edema ABDOMEN:abdomen soft, non-tender and normal bowel sounds MUSCULOSKELETAL:no cyanosis of digits and no clubbing  NEURO: alert & oriented x 3 with fluent speech, no focal motor/sensory deficits EXTREMITIES: No lower extremity edema BREASTDenies any: No palpable masses or nodules in either right or left breasts. No palpable axillary supraclavicular or infraclavicular adenopathy no breast tenderness or nipple discharge. (exam performed in the presence of a chaperone)  LABORATORY DATA:  I have reviewed the data as listed   Chemistry      Component Value Date/Time   NA 139 11/04/2015 1131   NA 142 10/30/2014 1119   K 4.7 11/04/2015 1131   K 4.5 10/30/2014 1119   CL 104 11/04/2015 1131   CL 106 09/23/2012 0947   CO2 26 11/04/2015 1131   CO2 25 10/30/2014 1119   BUN 16 11/04/2015 1131   BUN 11.8 10/30/2014 1119   CREATININE 0.71 11/04/2015 1131   CREATININE 0.8 10/30/2014 1119   CREATININE 0.72 08/23/2014 6237  Component Value Date/Time   CALCIUM 9.8 11/04/2015 1131   CALCIUM 9.1 10/30/2014 1119   ALKPHOS 106 11/04/2015 1131   ALKPHOS 97 10/30/2014 1119   AST 34 11/04/2015 1131   AST 56* 10/30/2014 1119   ALT 22 11/04/2015 1131   ALT 43 10/30/2014 1119   BILITOT 0.4 11/04/2015 1131   BILITOT 0.36 10/30/2014 1119       Lab Results  Component Value Date   WBC 5.0 10/30/2014   HGB 12.5 10/30/2014   HCT 39.9 10/30/2014   MCV 90.5 10/30/2014   PLT 259 10/30/2014   NEUTROABS 2.8 10/30/2014   ASSESSMENT & PLAN:   Breast cancer of upper-inner quadrant of left female breast ([facility]) stage I invasive ductal carcinoma measuring 0.5 cm ER positive PR positive HER-2/neu negative. Patient is status post lumpectomy 04/22/2010 followed by partial breast radiation. She was then begun on  adjuvant tamoxifen 20 mg daily since November 2011 Completed November 2016  Tamoxifen toxicities: 1. Frequent hot flashes Hot flashes have improved. But patient has fibromyalgia which causes achiness.  Breast cancer surveillance: 1. Breast exam 10/30/2014 is normal 2. Mammogram 08/09/2014 is normal  Osteoporosis: Apparently had a reaction to Zometa.  Return to clinic in 1 year for follow-up with survivorship clinic.  No orders of the defined types were placed in this encounter.   The patient has a good understanding of the overall plan. she agrees with it. she will call with any problems that may develop before the next visit here.   Rulon Eisenmenger, MD 11/07/2015

## 2015-11-07 NOTE — Telephone Encounter (Signed)
appt made and avs printed °

## 2015-11-07 NOTE — Assessment & Plan Note (Signed)
stage I invasive ductal carcinoma measuring 0.5 cm ER positive PR positive HER-2/neu negative. Patient is status post lumpectomy 04/22/2010 followed by partial breast radiation. She was then begun on adjuvant tamoxifen 20 mg daily since November 2011  Tamoxifen toxicities: 1. Frequent hot flashes I discussed the pros and cons of continuation of extended adjuvant therapy with tamoxifen beyond 5 years. Based upon the small tumor size and low risk features, her benefit from adjuvant extended therapy may be relatively small. Based on the ATLAS and ATOM trials, there was a 4% advantage overall but that was mostly felt to be in the high risk group to have the most benefit. If she wanted to come off tamoxifen, it would be reasonable. Breast cancer surveillance: 1. Breast exam 10/30/2014 is normal 2. Mammogram 08/09/2014 is normal  Osteoporosis: Apparently had a reaction to Zometa.  Return to clinic in 1 year for follow-up   

## 2015-11-12 DIAGNOSIS — J4540 Moderate persistent asthma, uncomplicated: Secondary | ICD-10-CM | POA: Diagnosis not present

## 2015-11-12 DIAGNOSIS — J3081 Allergic rhinitis due to animal (cat) (dog) hair and dander: Secondary | ICD-10-CM | POA: Diagnosis not present

## 2015-11-12 DIAGNOSIS — L509 Urticaria, unspecified: Secondary | ICD-10-CM | POA: Diagnosis not present

## 2015-11-12 DIAGNOSIS — J3089 Other allergic rhinitis: Secondary | ICD-10-CM | POA: Diagnosis not present

## 2015-11-12 DIAGNOSIS — J301 Allergic rhinitis due to pollen: Secondary | ICD-10-CM | POA: Diagnosis not present

## 2015-12-19 DIAGNOSIS — Z79899 Other long term (current) drug therapy: Secondary | ICD-10-CM | POA: Diagnosis not present

## 2015-12-19 DIAGNOSIS — Z87891 Personal history of nicotine dependence: Secondary | ICD-10-CM | POA: Diagnosis not present

## 2015-12-19 DIAGNOSIS — G4733 Obstructive sleep apnea (adult) (pediatric): Secondary | ICD-10-CM | POA: Diagnosis not present

## 2015-12-19 DIAGNOSIS — R05 Cough: Secondary | ICD-10-CM | POA: Diagnosis not present

## 2015-12-19 DIAGNOSIS — J4540 Moderate persistent asthma, uncomplicated: Secondary | ICD-10-CM | POA: Diagnosis not present

## 2015-12-19 DIAGNOSIS — Z7951 Long term (current) use of inhaled steroids: Secondary | ICD-10-CM | POA: Diagnosis not present

## 2015-12-19 DIAGNOSIS — Z9989 Dependence on other enabling machines and devices: Secondary | ICD-10-CM | POA: Diagnosis not present

## 2015-12-25 ENCOUNTER — Other Ambulatory Visit: Payer: Self-pay | Admitting: Family Medicine

## 2015-12-30 ENCOUNTER — Ambulatory Visit (INDEPENDENT_AMBULATORY_CARE_PROVIDER_SITE_OTHER): Payer: Medicare Other | Admitting: *Deleted

## 2015-12-30 DIAGNOSIS — I442 Atrioventricular block, complete: Secondary | ICD-10-CM | POA: Diagnosis not present

## 2015-12-31 NOTE — Progress Notes (Signed)
Remote pacemaker transmission.   

## 2016-01-15 ENCOUNTER — Ambulatory Visit (INDEPENDENT_AMBULATORY_CARE_PROVIDER_SITE_OTHER): Payer: Medicare Other

## 2016-01-15 ENCOUNTER — Ambulatory Visit (INDEPENDENT_AMBULATORY_CARE_PROVIDER_SITE_OTHER): Payer: Medicare Other | Admitting: Family Medicine

## 2016-01-15 ENCOUNTER — Encounter: Payer: Self-pay | Admitting: Family Medicine

## 2016-01-15 VITALS — BP 134/76 | HR 86 | Wt 209.0 lb

## 2016-01-15 DIAGNOSIS — M25579 Pain in unspecified ankle and joints of unspecified foot: Secondary | ICD-10-CM

## 2016-01-15 DIAGNOSIS — R140 Abdominal distension (gaseous): Secondary | ICD-10-CM | POA: Diagnosis not present

## 2016-01-15 DIAGNOSIS — M7731 Calcaneal spur, right foot: Secondary | ICD-10-CM

## 2016-01-15 DIAGNOSIS — Z0000 Encounter for general adult medical examination without abnormal findings: Secondary | ICD-10-CM

## 2016-01-15 DIAGNOSIS — M7732 Calcaneal spur, left foot: Secondary | ICD-10-CM

## 2016-01-15 DIAGNOSIS — K760 Fatty (change of) liver, not elsewhere classified: Secondary | ICD-10-CM

## 2016-01-15 DIAGNOSIS — E785 Hyperlipidemia, unspecified: Secondary | ICD-10-CM | POA: Diagnosis not present

## 2016-01-15 DIAGNOSIS — E039 Hypothyroidism, unspecified: Secondary | ICD-10-CM

## 2016-01-15 LAB — COMPLETE METABOLIC PANEL WITH GFR
ALT: 47 U/L — AB (ref 6–29)
AST: 64 U/L — AB (ref 10–35)
Albumin: 4.3 g/dL (ref 3.6–5.1)
Alkaline Phosphatase: 94 U/L (ref 33–130)
BUN: 13 mg/dL (ref 7–25)
CALCIUM: 8.9 mg/dL (ref 8.6–10.4)
CHLORIDE: 102 mmol/L (ref 98–110)
CO2: 25 mmol/L (ref 20–31)
CREATININE: 0.87 mg/dL (ref 0.50–0.99)
GFR, Est African American: 80 mL/min (ref >=60)
GFR, Est Non African American: 70 mL/min (ref >=60)
Glucose, Bld: 117 mg/dL — ABNORMAL HIGH (ref 65–99)
POTASSIUM: 4.4 mmol/L (ref 3.5–5.3)
Sodium: 137 mmol/L (ref 135–146)
Total Bilirubin: 0.4 mg/dL (ref 0.2–1.2)
Total Protein: 7.4 g/dL (ref 6.1–8.1)

## 2016-01-15 LAB — LIPID PANEL
CHOL/HDL RATIO: 3.2 ratio (ref <=5.0)
CHOLESTEROL: 199 mg/dL (ref 125–200)
HDL: 63 mg/dL (ref >=46)
LDL Cholesterol: 115 mg/dL (ref <130)
Triglycerides: 104 mg/dL (ref <150)
VLDL: 21 mg/dL (ref <30)

## 2016-01-15 LAB — CBC
HEMATOCRIT: 38.7 % (ref 35.0–45.0)
HEMOGLOBIN: 12.1 g/dL (ref 11.7–15.5)
MCH: 26.5 pg — AB (ref 27.0–33.0)
MCHC: 31.3 g/dL — AB (ref 32.0–36.0)
MCV: 84.7 fL (ref 80.0–100.0)
MPV: 10.3 fL (ref 7.5–12.5)
Platelets: 381 10*3/uL (ref 140–400)
RBC: 4.57 MIL/uL (ref 3.80–5.10)
RDW: 15.5 % — ABNORMAL HIGH (ref 11.0–15.0)
WBC: 6.8 10*3/uL (ref 3.8–10.8)

## 2016-01-15 LAB — TSH: TSH: 3.12 mIU/L

## 2016-01-15 NOTE — Patient Instructions (Addendum)
Keep up a regular exercise program and make sure you are eating a healthy diet Try to eat 4 servings of dairy a day, or if you are lactose intolerant take a calcium with vitamin D daily.  Your vaccines are up to date.  Please schedule with one of our sports med docs for your feet.

## 2016-01-15 NOTE — Progress Notes (Signed)
Subjective:    Tanya Hogan is a 65 y.o. female who presents for Medicare Annual/Subsequent preventive examination.  Preventive Screening-Counseling & Management  Tobacco History  Smoking status  . Former Smoker  . Quit date: 08/24/1966  Smokeless tobacco  . Not on file     Problems Prior to Visit 1. C/O of bilat foot pain. She says it's been going on for over a year but has been worse over the last couple of months. She says her second third toes will sometimes cramp and actually spasm and crossover each other. She is also getting some pain in her fifth digit. At times she says she feels like it's throbbing a mostly gets disconnected from the rest of her foot. She says her pain is similar to what she had about a year ago when she was diagnosed with a stress fracture. She says at night her feet will burn and hurt. No worsening or alleviating factors. She says with the Tylenol because of her liver disease and tries to stay way from aspirin and NSAIDs because of her history of ulcers. 2. Bloating - she also complains of abdominal bloating. She does use her dicyclomine and sometimes that seems to help but sometimes it doesn't. She wonders if something else could be going on. Her last colonoscopy was in October 2015. No blood in the stool.  Current Problems (verified) Patient Active Problem List   Diagnosis Date Noted  . NASH (nonalcoholic steatohepatitis) 05/26/2015  . Fatty liver disease, nonalcoholic 94/85/4627  . Diabetes mellitus type 2, controlled, with complications ([place]) 03/50/0938  . Rash 05/10/2014  . Jaw pain 05/10/2014  . Obesity, Class II, BMI 35-39.9, with comorbidity ([place]) 06/09/2013  . I 05/23/2013  . COPD (chronic obstructive pulmonary disease) ([place]) 04/03/2013  . Obesity 04/23/2011  . Breast cancer of upper-inner quadrant of left female breast ([place]) 03/18/2010  . FATIGUE 01/09/2010  . Hyperlipidemia 11/07/2009  . Disorder of bone and cartilage 10/29/2009  . LEFT  BUNDLE BRANCH BLOCK 10/23/2009  . PACEMAKER, PERMANENT 10/23/2009  . ALKALINE PHOSPHATASE, ELEVATED 10/13/2009  . Hypothyroidism 10/03/2009  . Diabetes mellitus without complication ([place]) 18/29/9371  . DEPRESSION 07/02/2009  . ESSENTIAL HYPERTENSION, BENIGN 07/02/2009  . BACK PAIN, CHRONIC 07/02/2009  . Chronic systolic CHF (congestive heart failure) ([place]) 04/26/2009    Medications Prior to Visit Current Outpatient Prescriptions on File Prior to Visit  Medication Sig Dispense Refill  . acetaminophen (TYLENOL) 325 MG tablet Take 650 mg by mouth.    . AMBULATORY NON FORMULARY MEDICATION Medication Name: Needs tubing and suplies for her nebulizer machine. Dx COPD. [facility] 1 vial 0  . AMBULATORY NON FORMULARY MEDICATION Fastclix lancets Dx: E11.9 300 each 3  . bumetanide (BUMEX) 1 MG tablet Take 1 tablet by mouth two  times daily 180 tablet 1  . BYSTOLIC 10 MG tablet Take 1 tablet by mouth  daily 90 tablet 2  . dicyclomine (BENTYL) 20 MG tablet Take 10-20 mg by mouth every 8 (eight) hours as needed for spasms. Take 1/2-1 tablet by mouth every eights hours as needed for cramps  11  . diltiazem (CARDIZEM) 120 MG tablet Take 120 mg by mouth daily.     . DULoxetine (CYMBALTA) 30 MG capsule Take 30 mg by mouth daily.     . Fluticasone-Salmeterol (ADVAIR) 250-50 MCG/DOSE AEPB Frequency:two times daily   Dosage:250-50   MCG/DOSE  Instructions:Advair Diskus (250-50MCG/DOSE MISC, 2  puff  Inhalation two times daily)  Note:    .  glucose blood (ACCU-CHEK SMARTVIEW) test strip Test two times daily 100 each 11  . Insulin Glargine (LANTUS SOLOSTAR) 100 UNIT/ML Solostar Pen Inject 20 Units into the skin daily at 10 pm. 15 mL 5  . Insulin Pen Needle (BD PEN NEEDLE NANO U/F) 32G X 4 MM MISC Test two times daily 100 each 11  . ketotifen (ALAWAY) 0.025 % ophthalmic solution 1 drop 2 (two) times daily.    . Lancets Misc. (ACCU-CHEK FASTCLIX LANCET) KIT Test two times daily. 3 kit 11  . levothyroxine  (SYNTHROID, LEVOTHROID) 75 MCG tablet Take 1 tablet by mouth  daily 90 tablet 0  . pantoprazole (PROTONIX) 40 MG tablet Take 40 mg by mouth daily.   11  . PROAIR HFA 108 (90 BASE) MCG/ACT inhaler Use 2 puffs every 4 hours  as needed 17 g 2  . SINGULAIR 10 MG tablet Take 1 tablet by mouth  daily 90 tablet 2  . SPIRIVA HANDIHALER 18 MCG inhalation capsule Inhale the contents of 1  capsule via HandiHaler  daily 30 capsule 0   No current facility-administered medications on file prior to visit.    Current Medications (verified) Current Outpatient Prescriptions  Medication Sig Dispense Refill  . acetaminophen (TYLENOL) 325 MG tablet Take 650 mg by mouth.    . AMBULATORY NON FORMULARY MEDICATION Medication Name: Needs tubing and suplies for her nebulizer machine. Dx COPD. [facility] 1 vial 0  . AMBULATORY NON FORMULARY MEDICATION Fastclix lancets Dx: E11.9 300 each 3  . bumetanide (BUMEX) 1 MG tablet Take 1 tablet by mouth two  times daily 180 tablet 1  . BYSTOLIC 10 MG tablet Take 1 tablet by mouth  daily 90 tablet 2  . dicyclomine (BENTYL) 20 MG tablet Take 10-20 mg by mouth every 8 (eight) hours as needed for spasms. Take 1/2-1 tablet by mouth every eights hours as needed for cramps  11  . diltiazem (CARDIZEM) 120 MG tablet Take 120 mg by mouth daily.     . DULoxetine (CYMBALTA) 30 MG capsule Take 30 mg by mouth daily.     [name] EPIPEN 2-PAK 0.3 MG/0.3ML SOAJ injection INJECT 1 PEN INTO MUSCLE OF LATERAL THIGH FOR SEVERE ALLERGIC REACTION. CALL 911 AFTER USING THIS MEDICATION.  2  . fexofenadine (ALLEGRA) 180 MG tablet Take 180 mg by mouth daily.    . Fluticasone-Salmeterol (ADVAIR) 250-50 MCG/DOSE AEPB Frequency:two times daily   Dosage:250-50   MCG/DOSE  Instructions:Advair Diskus (250-50MCG/DOSE MISC, 2  puff  Inhalation two times daily)  Note:    . glucose blood (ACCU-CHEK SMARTVIEW) test strip Test two times daily 100 each 11  . Insulin Glargine (LANTUS SOLOSTAR) 100 UNIT/ML Solostar  Pen Inject 20 Units into the skin daily at 10 pm. 15 mL 5  . Insulin Pen Needle (BD PEN NEEDLE NANO U/F) 32G X 4 MM MISC Test two times daily 100 each 11  . ketotifen (ALAWAY) 0.025 % ophthalmic solution 1 drop 2 (two) times daily.    . Lancets Misc. (ACCU-CHEK FASTCLIX LANCET) KIT Test two times daily. 3 kit 11  . levothyroxine (SYNTHROID, LEVOTHROID) 75 MCG tablet Take 1 tablet by mouth  daily 90 tablet 0  . Olopatadine HCl (PAZEO) 0.7 % SOLN Apply 1 drop to eye daily.    . pantoprazole (PROTONIX) 40 MG tablet Take 40 mg by mouth daily.   11  . PROAIR HFA 108 (90 BASE) MCG/ACT inhaler Use 2 puffs every 4 hours  as needed 17 g 2  .  SINGULAIR 10 MG tablet Take 1 tablet by mouth  daily 90 tablet 2  . SPIRIVA HANDIHALER 18 MCG inhalation capsule Inhale the contents of 1  capsule via HandiHaler  daily 30 capsule 0   No current facility-administered medications for this visit.     Allergies (verified) Penicillins; Accupril; Aspirin; Baking soda-fluoride; Codeine; Fluticasone; Furosemide; Lactose intolerance (gi); Losartan potassium; Metformin and related; Nasonex; Quinine; Rifampin; Clindamycin/lincomycin; Miralax; Sulfa antibiotics; Sulfamethoxazole; Sulfonamide derivatives; and Zoledronic acid   PAST HISTORY  Family History Family History  Problem Relation Age of Onset  . Heart failure    . Breast cancer    . Hypertension    . Melanoma Mother     Social History Social History  Substance Use Topics  . Smoking status: Former Smoker    Quit date: 08/24/1966  . Smokeless tobacco: Not on file  . Alcohol Use: No     Are there smokers in your home (other than you)? No  Risk Factors Current exercise habits: The patient does not participate in regular exercise at present.  Dietary issues discussed: None  Cardiac risk factors: advanced age (older than 18 for men, 13 for women), dyslipidemia, hypertension, obesity (BMI >= 30 kg/m2) and sedentary lifestyle.  Depression Screen (Note:  if answer to either of the following is "Yes", a more complete depression screening is indicated)   Over the past two weeks, have you felt down, depressed or hopeless? No  Over the past two weeks, have you felt little interest or pleasure in doing things? No  Have you lost interest or pleasure in daily life? No  Do you often feel hopeless? No  Do you cry easily over simple problems? No  Activities of Daily Living In your present state of health, do you have any difficulty performing the following activities?:  Driving? No Managing money?  Yes Feeding yourself? No Getting from bed to chair? No   Climbing a flight of stairs? Yes, gets SOB Preparing food and eating?: No Bathing or showering? Yes Getting dressed: Yes Getting to the toilet? Yes Using the toilet:Yes Moving around from place to place: No In the past year have you fallen or had a near fall?:No   Hearing Difficulties: Yes Do you often ask people to speak up or repeat themselves? Yes Do you experience ringing or noises in your ears? Yes Do you have difficulty understanding soft or whispered voices? Yes   Do you feel that you have a problem with memory? No  Do you often misplace items? No  Do you feel safe at home?  Yes  Cognitive Testing  Alert? Yes  Normal Appearance?Yes  Oriented to person? Yes  Place? Yes   Time? Yes  Recall of three objects?  Yes  Can perform simple calculations? Yes  Displays appropriate judgment?Yes  Can read the correct time from a watch face?Yes   Advanced Directives have been discussed with the patient? Yes  List the Names of Other Physician/Practitioners you currently use: 1.    Indicate any recent Medical Services you may have received from other than [facility] providers in the past year (date may be approximate).  Immunization History  Administered Date(s) Administered  . Influenza Split 05/10/2012, 05/04/2014  . Influenza Whole 06/14/2009, 05/08/2010, 04/24/2011  . Influenza,inj,Quad  PF,36+ Mos 04/28/2013, 05/04/2014, 05/06/2015  . Pneumococcal Conjugate-13 05/25/2014  . Pneumococcal Polysaccharide-23 11/28/2012  . Td 08/24/2004, 03/07/2010  . Tdap 07/14/2012  . Zoster 04/24/2011    Screening Tests Health Maintenance  Topic Date Due  .  INFLUENZA VACCINE  03/24/2016  . FOOT EXAM  05/05/2016  . HEMOGLOBIN A1C  05/06/2016  . OPHTHALMOLOGY EXAM  07/31/2016  . URINE MICROALBUMIN  08/04/2016  . MAMMOGRAM  09/03/2017  . PNA vac Low Risk Adult (2 of 2 - PPSV23) 11/28/2017  . COLONOSCOPY  06/13/2019  . TETANUS/TDAP  07/14/2022  . DEXA SCAN  Completed  . ZOSTAVAX  Completed  . Hepatitis C Screening  Completed    All answers were reviewed with the patient and necessary referrals were made:  [name],[name], MD   01/15/2016   History reviewed: allergies, current medications, past family history, past medical history, past social history, past surgical history and problem list  Review of Systems A comprehensive review of systems was negative.    Objective:     Vision by Snellen chart: UTD, see scanned chart.    Body mass index is 39.51 kg/(m^2). BP 134/76 mmHg  Pulse 86  Wt 209 lb (94.802 kg)  SpO2 93%  BP 134/76 mmHg  Pulse 86  Wt 209 lb (94.802 kg)  SpO2 93% General appearance: alert, cooperative and appears stated age Head: Normocephalic, without obvious abnormality, atraumatic Eyes: conj clear, EOMI, PEERLA Ears: normal TM's and external ear canals both ears Nose: Nares normal. Septum midline. Mucosa normal. No drainage or sinus tenderness. Throat: lips, mucosa, and tongue normal; teeth and gums normal Neck: no adenopathy, no carotid bruit, no JVD, supple, symmetrical, trachea midline and thyroid not enlarged, symmetric, no tenderness/mass/nodules Back: symmetric, no curvature. ROM normal. No CVA tenderness. Lungs: clear to auscultation bilaterally Breasts: normal appearance, no masses or tenderness Heart: regular rate and rhythm, S1, S2  normal, no murmur, click, rub or gallop Abdomen: soft, non-tender; bowel sounds normal; no masses,  no organomegaly Pelvic: deferred Extremities: extremities normal, atraumatic, no cyanosis or edema Pulses: 2+ and symmetric Skin: Skin color, texture, turgor normal. No rashes or lesions Lymph nodes: Cervical adenopathy: nl and Axillary adenopathy: nl Neurologic: Alert and oriented X 3, normal strength and tone. Normal symmetric reflexes. Normal coordination and gait     Assessment:     Medicare Wellness Exam       Plan:     During the course of the visit the patient was educated and counseled about appropriate screening and preventive services including:    Screening mammography  Bone densitometry screening  Colorectal cancer screening   She reports that she's not exercising because of her pain and because of her breathing.  Diet review for nutrition referral? Yes ____  Not Indicated ___x_   Patient Instructions (the written plan) was given to the patient.  Medicare Attestation I have personally reviewed: The patient's medical and social history Their use of alcohol, tobacco or illicit drugs Their current medications and supplements The patient's functional ability including ADLs,fall risks, home safety risks, cognitive, and hearing and visual impairment Diet and physical activities Evidence for depression or mood disorders  The patient's weight, height, BMI, and visual acuity have been recorded in the chart.  I have made referrals, counseling, and provided education to the patient based on review of the above and I have provided the patient with a written personalized care plan for preventive services.     [name],[name], MD   01/15/2016    A/P: 1. Bilateral foot pain-metatarsals are nontender on exam but we'll go ahead and get x-rays today since she feels like her pain is similar to when she had a stress fracture. We'll also refer her to one of our sports  medicine doctors for further treatment and evaluation of her foot pain. I think she would actually benefit from custom orthotics as well. 2. Bloating-unclear etiology at this point. Will check for food allergies that might be triggering this for her. Continue dicyclomine since it does seem to help sometimes.  3. Hyperlipidemia - due to recheck lipids.    4. She did want to give me an update that she is now categorized as stage III fibrosis for her nonalcoholic fatty liver disease.

## 2016-01-21 ENCOUNTER — Other Ambulatory Visit: Payer: Self-pay | Admitting: Family Medicine

## 2016-01-22 ENCOUNTER — Encounter: Payer: Self-pay | Admitting: Sports Medicine

## 2016-01-22 ENCOUNTER — Ambulatory Visit (INDEPENDENT_AMBULATORY_CARE_PROVIDER_SITE_OTHER): Payer: Medicare Other | Admitting: Sports Medicine

## 2016-01-22 VITALS — BP 123/74 | HR 80 | Resp 18 | Wt 210.7 lb

## 2016-01-22 DIAGNOSIS — M79672 Pain in left foot: Secondary | ICD-10-CM

## 2016-01-22 DIAGNOSIS — M79671 Pain in right foot: Secondary | ICD-10-CM | POA: Diagnosis not present

## 2016-01-22 LAB — IGG FOOD PANEL
ALLERGEN EGG WHITE IGG: 4.7 ug/mL — AB (ref <2.0)
ALLERGEN WHEAT IGG: 0.91 ug/mL — AB (ref <0.15)
Allergen, Corn, IgG4: 0.47 ug/mL — ABNORMAL HIGH (ref <0.15)
Allergen, Milk, IgG: >30 ug/mL — ABNORMAL HIGH (ref <0.15)
Beef, IgG: 14 ug/mL — ABNORMAL HIGH (ref <2.0)
Egg yolk, IgG: 4.6 ug/mL — ABNORMAL HIGH (ref <2.0)
Peanut, IgG: 3.03 ug/mL — ABNORMAL HIGH (ref <0.15)

## 2016-01-22 MED ORDER — MAGNESIUM OXIDE 400 MG PO TABS: 800.0000 mg | ORAL_TABLET | Freq: Every day | ORAL | Status: DC

## 2016-01-22 MED ORDER — NAPROXEN-ESOMEPRAZOLE 500-20 MG PO TBEC: 1.0000 | DELAYED_RELEASE_TABLET | Freq: Two times a day (BID) | ORAL | Status: DC

## 2016-01-22 NOTE — Progress Notes (Signed)
   Subjective:    I'm seeing this patient as a consultation for:  Dr. Beatrice Lecher  CC: bilateral foot pain  HPI: This is a pleasant 65 year old female, she comes in with a long history of pain in both feet, bilaterally under the metatarsal heads and on the left side of the fifth metatarsophalangeal joint, moderate, persistent, she really has not been taking NSAIDs due to history of peptic ulcer disease, and is not taking Tylenol but she cannot tell me why not. Pain is moderate, persistent without radiation.  Past medical history, Surgical history, Family history not pertinant except as noted below, Social history, Allergies, and medications have been entered into the medical record, reviewed, and no changes needed.   Review of Systems: No headache, visual changes, nausea, vomiting, diarrhea, constipation, dizziness, abdominal pain, skin rash, fevers, chills, night sweats, weight loss, swollen lymph nodes, body aches, joint swelling, muscle aches, chest pain, shortness of breath, mood changes, visual or auditory hallucinations.   Objective:   General: Well Developed, well nourished, and in no acute distress.  Neuro/Psych: Alert and oriented x3, extra-ocular muscles intact, able to move all 4 extremities, sensation grossly intact. Skin: Warm and dry, no rashes noted.  Respiratory: Not using accessory muscles, speaking in full sentences, trachea midline.  Cardiovascular: Pulses palpable, no extremity edema. Abdomen: Does not appear distended. Bilateral feet: No visible erythema or swelling. Range of motion is full in all directions. Strength is 5/5 in all directions. No hallux valgus. Bilateral pes planus. No abnormal callus noted. No pain over the navicular prominence, or base of fifth metatarsal. No tenderness to palpation of the calcaneal insertion of plantar fascia. No pain at the Achilles insertion. No pain over the calcaneal bursa. No pain of the retrocalcaneal  bursa. Tender to palpation under the metatarsal heads bilaterally and at the left fifth metatarsophalangeal joint No hallux rigidus or limitus. No tenderness palpation over interphalangeal joints. No pain with compression of the metatarsal heads. Neurovascularly intact distally.  Impression and Recommendations:   This case required medical decision making of moderate complexity.

## 2016-01-22 NOTE — Assessment & Plan Note (Signed)
Bilateral metatarsalgia, also some left fifth metatarsophalangeal joint synovitis. Adding Vimovo, she will return for custom orthotics.

## 2016-01-27 ENCOUNTER — Ambulatory Visit (INDEPENDENT_AMBULATORY_CARE_PROVIDER_SITE_OTHER): Payer: Medicare Other | Admitting: Sports Medicine

## 2016-01-27 ENCOUNTER — Encounter: Payer: Self-pay | Admitting: Sports Medicine

## 2016-01-27 VITALS — BP 122/68 | HR 86 | Resp 18 | Wt 210.0 lb

## 2016-01-27 DIAGNOSIS — M79671 Pain in right foot: Secondary | ICD-10-CM

## 2016-01-27 DIAGNOSIS — M79672 Pain in left foot: Secondary | ICD-10-CM

## 2016-01-27 MED ORDER — NAPROXEN-ESOMEPRAZOLE 500-20 MG PO TBEC: 1.0000 | DELAYED_RELEASE_TABLET | Freq: Two times a day (BID) | ORAL | Status: DC

## 2016-01-27 NOTE — Addendum Note (Signed)
Addended by: Silverio Decamp on: 01/27/2016 11:03 AM   Modules accepted: Orders

## 2016-01-27 NOTE — Assessment & Plan Note (Signed)
Bilateral metatarsalgia with left fifth metatarsophalangeal synovitis. Custom orthotics as above, return in one month, injection if no better.

## 2016-01-27 NOTE — Addendum Note (Signed)
Addended by: Elizabeth Sauer on: 01/27/2016 10:57 AM   Modules accepted: Orders, Medications

## 2016-01-27 NOTE — Progress Notes (Signed)
    Patient was fitted for a : standard, cushioned, semi-rigid orthotic. The orthotic was heated and afterward the patient stood on the orthotic blank positioned on the orthotic stand. The patient was positioned in subtalar neutral position and 10 degrees of ankle dorsiflexion in a weight bearing stance. After completion of molding, a stable base was applied to the orthotic blank. The blank was ground to a stable position for weight bearing. Size: 6 Base: White EVA Additional Posting and Padding: None The patient ambulated these, and they were very comfortable.  I spent 40 minutes with this patient, greater than 50% was face-to-face time counseling regarding the below diagnosis.   

## 2016-01-28 ENCOUNTER — Other Ambulatory Visit: Payer: Self-pay | Admitting: Family Medicine

## 2016-01-29 DIAGNOSIS — G4733 Obstructive sleep apnea (adult) (pediatric): Secondary | ICD-10-CM | POA: Diagnosis not present

## 2016-02-05 ENCOUNTER — Encounter: Payer: Self-pay | Admitting: Cardiology

## 2016-02-06 LAB — CUP PACEART REMOTE DEVICE CHECK
Brady Statistic AP VP Percent: 48.79 %
Brady Statistic AP VS Percent: 0.01 %
Brady Statistic AS VP Percent: 50.68 %
Implantable Lead Implant Date: 20030324
Implantable Lead Location: 753858
Implantable Lead Location: 753859
Implantable Lead Model: 4068
Lead Channel Impedance Value: 1140 Ohm
Lead Channel Impedance Value: 1330 Ohm
Lead Channel Impedance Value: 4047 Ohm
Lead Channel Impedance Value: 4047 Ohm
Lead Channel Impedance Value: 437 Ohm
Lead Channel Impedance Value: 589 Ohm
Lead Channel Pacing Threshold Amplitude: 0.75 V
Lead Channel Sensing Intrinsic Amplitude: 1.5 mV
Lead Channel Sensing Intrinsic Amplitude: 1.5 mV
Lead Channel Setting Pacing Amplitude: 1.5 V
Lead Channel Setting Pacing Amplitude: 4.25 V
Lead Channel Setting Pacing Pulse Width: 0.8 ms
Lead Channel Setting Sensing Sensitivity: 4 mV
MDC IDC LEAD IMPLANT DT: 20030324
MDC IDC LEAD IMPLANT DT: 20030324
MDC IDC LEAD LOCATION: 753860
MDC IDC MSMT BATTERY REMAINING LONGEVITY: 59 mo
MDC IDC MSMT BATTERY VOLTAGE: 3 V
MDC IDC MSMT LEADCHNL LV IMPEDANCE VALUE: 4047 Ohm
MDC IDC MSMT LEADCHNL RA IMPEDANCE VALUE: 380 Ohm
MDC IDC MSMT LEADCHNL RA IMPEDANCE VALUE: 418 Ohm
MDC IDC MSMT LEADCHNL RA PACING THRESHOLD PULSEWIDTH: 0.4 ms
MDC IDC MSMT LEADCHNL RV PACING THRESHOLD AMPLITUDE: 1.75 V
MDC IDC MSMT LEADCHNL RV PACING THRESHOLD PULSEWIDTH: 0.4 ms
MDC IDC MSMT LEADCHNL RV SENSING INTR AMPL: 10.5 mV
MDC IDC MSMT LEADCHNL RV SENSING INTR AMPL: 10.5 mV
MDC IDC SESS DTM: 20170508133744
MDC IDC SET LEADCHNL LV PACING AMPLITUDE: 2.25 V
MDC IDC SET LEADCHNL RV PACING PULSEWIDTH: 0.4 ms
MDC IDC STAT BRADY AS VS PERCENT: 0.52 %
MDC IDC STAT BRADY RA PERCENT PACED: 48.8 %
MDC IDC STAT BRADY RV PERCENT PACED: 99.47 %

## 2016-02-19 ENCOUNTER — Ambulatory Visit (INDEPENDENT_AMBULATORY_CARE_PROVIDER_SITE_OTHER): Payer: Medicare Other | Admitting: Family Medicine

## 2016-02-19 ENCOUNTER — Encounter: Payer: Self-pay | Admitting: Family Medicine

## 2016-02-19 VITALS — BP 136/71 | HR 82 | Wt 204.0 lb

## 2016-02-19 DIAGNOSIS — E119 Type 2 diabetes mellitus without complications: Secondary | ICD-10-CM | POA: Diagnosis not present

## 2016-02-19 DIAGNOSIS — I10 Essential (primary) hypertension: Secondary | ICD-10-CM

## 2016-02-19 DIAGNOSIS — E039 Hypothyroidism, unspecified: Secondary | ICD-10-CM

## 2016-02-19 DIAGNOSIS — M79672 Pain in left foot: Secondary | ICD-10-CM

## 2016-02-19 DIAGNOSIS — M79671 Pain in right foot: Secondary | ICD-10-CM | POA: Diagnosis not present

## 2016-02-19 LAB — POCT GLYCOSYLATED HEMOGLOBIN (HGB A1C): HEMOGLOBIN A1C: 6.2

## 2016-02-19 MED ORDER — NAPROXEN-ESOMEPRAZOLE 500-20 MG PO TBEC: 1.0000 | DELAYED_RELEASE_TABLET | Freq: Two times a day (BID) | ORAL | Status: DC

## 2016-02-19 NOTE — Progress Notes (Signed)
Subjective:    CC: DM  HPI:  Diabetes - no hypoglycemic events. No wounds or sores that are not healing well. No increased thirst or urination. Checking glucose at home. Taking medications as prescribed without any side effects.  Hypothyroid - doing well. No recent skin or hair changes.  Taking medication regualrly.  TSH 3.12 approx 1 month ago.    Hypertension- Pt denies chest pain, SOB, dizziness, or heart palpitations.  Taking meds as directed w/o problems.  Denies medication side effects.     Past medical history, Surgical history, Family history not pertinant except as noted below, Social history, Allergies, and medications have been entered into the medical record, reviewed, and corrections made.   Review of Systems: No fevers, chills, night sweats, weight loss, chest pain, or shortness of breath.   Objective:    General: Well Developed, well nourished, and in no acute distress.  Neuro: Alert and oriented x3, extra-ocular muscles intact, sensation grossly intact.  HEENT: Normocephalic, atraumatic  Skin: Warm and dry, no rashes. Cardiac: Regular rate and rhythm, no murmurs rubs or gallops, no lower extremity edema.  Respiratory: Clear to auscultation bilaterally. Not using accessory muscles, speaking in full sentences.   Impression and Recommendations:   DM - Well controlled. 11 A1c of 6.2 today. This up just a little bit from previous of 6.1. Continue current regimen of Lantus. Follow-up in 3 months.  Hypothyroid- Thyroid is normal.  . Recheck in 6-12 months.  HTN- Well controlled. Continue current regimen. Follow up in 6 months.   Needs refill on NSAID for her foot pain. Says it is really helping her pain.  Will given samples. Has f/u appt next week.

## 2016-02-24 ENCOUNTER — Encounter: Payer: Self-pay | Admitting: Sports Medicine

## 2016-02-24 ENCOUNTER — Ambulatory Visit (INDEPENDENT_AMBULATORY_CARE_PROVIDER_SITE_OTHER): Payer: Medicare Other | Admitting: Sports Medicine

## 2016-02-24 VITALS — BP 130/67 | HR 77 | Resp 18 | Wt 204.5 lb

## 2016-02-24 DIAGNOSIS — M79671 Pain in right foot: Secondary | ICD-10-CM | POA: Diagnosis not present

## 2016-02-24 DIAGNOSIS — M79672 Pain in left foot: Secondary | ICD-10-CM

## 2016-02-24 DIAGNOSIS — M7501 Adhesive capsulitis of right shoulder: Secondary | ICD-10-CM

## 2016-02-24 DIAGNOSIS — Z96611 Presence of right artificial shoulder joint: Secondary | ICD-10-CM | POA: Insufficient documentation

## 2016-02-24 NOTE — Progress Notes (Signed)
  Subjective:    CC: Follow-up  HPI: Left foot pain: Fantastic improvement with custom orthotics however still has some pain at the first interphalangeal joint and the fifth PIP. Mild, persistent without radiation.  Right shoulder pain: Present for several weeks, localized over the joint line and increasing loss of range of motion.  Past medical history, Surgical history, Family history not pertinant except as noted below, Social history, Allergies, and medications have been entered into the medical record, reviewed, and no changes needed.   Review of Systems: No fevers, chills, night sweats, weight loss, chest pain, or shortness of breath.   Objective:    General: Well Developed, well nourished, and in no acute distress.  Neuro: Alert and oriented x3, extra-ocular muscles intact, sensation grossly intact.  HEENT: Normocephalic, atraumatic, pupils equal round reactive to light, neck supple, no masses, no lymphadenopathy, thyroid nonpalpable.  Skin: Warm and dry, no rashes. Cardiac: Regular rate and rhythm, no murmurs rubs or gallops, no lower extremity edema.  Respiratory: Clear to auscultation bilaterally. Not using accessory muscles, speaking in full sentences. Right shoulder: External rotation to approximate 10 with severe pain. Highly classic for adhesive capsulitis.  Procedure: Real-time Ultrasound Guided Injection of right glenohumeral joint Device: GE Logiq E  Verbal informed consent obtained.  Time-out conducted.  Noted no overlying erythema, induration, or other signs of local infection.  Skin prepped in a sterile fashion.  Local anesthesia: Topical Ethyl chloride.  With sterile technique and under real time ultrasound guidance:  Spinal needle advanced into the joint, 1 mL kenalog 40, 2 mL lidocaine, 2 mL Marcaine injected easily. Completed without difficulty  Pain immediately resolved suggesting accurate placement of the medication.  Advised to call if fevers/chills,  erythema, induration, drainage, or persistent bleeding.  Images permanently stored and available for review in the ultrasound unit.  Impression: Technically successful ultrasound guided injection.  Procedure: Real-time Ultrasound Guided Injection of left first interphalangeal joint Device: GE Logiq E  Verbal informed consent obtained.  Time-out conducted.  Noted no overlying erythema, induration, or other signs of local infection.  Skin prepped in a sterile fashion.  Local anesthesia: Topical Ethyl chloride.  With sterile technique and under real time ultrasound guidance:  1/4 mL kenalog 40, 1/4 mL lidocaine injected easily Completed without difficulty  Pain immediately resolved suggesting accurate placement of the medication.  Advised to call if fevers/chills, erythema, induration, drainage, or persistent bleeding.  Images permanently stored and available for review in the ultrasound unit.  Impression: Technically successful ultrasound guided injection.  rocedure: Real-time Ultrasound Guided Injection of left fifth proximal interphalangeal joint Device: GE Logiq E  Verbal informed consent obtained.  Time-out conducted.  Noted no overlying erythema, induration, or other signs of local infection.  Skin prepped in a sterile fashion.  Local anesthesia: Topical Ethyl chloride.  With sterile technique and under real time ultrasound guidance:  1/4 mL kenalog 40, 1/4 mL lidocaine injected easily Completed without difficulty  Pain immediately resolved suggesting accurate placement of the medication.  Advised to call if fevers/chills, erythema, induration, drainage, or persistent bleeding.  Images permanently stored and available for review in the ultrasound unit.  Impression: Technically successful ultrasound guided injection.  Impression and Recommendations:

## 2016-02-24 NOTE — Assessment & Plan Note (Signed)
Currently early in the inflammatory phase, the glenohumeral joint injection as above, return in one month.

## 2016-02-24 NOTE — Assessment & Plan Note (Signed)
Pain at the left first interphalangeal joint and the left fifth proximal interphalangeal joint, the structures were injected today.

## 2016-03-02 ENCOUNTER — Other Ambulatory Visit: Payer: Self-pay | Admitting: Family Medicine

## 2016-03-05 ENCOUNTER — Telehealth: Payer: Self-pay | Admitting: Family Medicine

## 2016-03-05 DIAGNOSIS — M79672 Pain in left foot: Principal | ICD-10-CM

## 2016-03-05 DIAGNOSIS — M79671 Pain in right foot: Secondary | ICD-10-CM

## 2016-03-05 NOTE — Telephone Encounter (Signed)
Pt has been on Vimovo 553m/20mg  from Dr. TDianah Fieldand has been coming by to get samples but still is waiting to hear back. Said a nMarine scientistwas working to get this approved with her insurance. Patient waiting to hear from you on this med. Thanks

## 2016-03-10 NOTE — Telephone Encounter (Signed)
Patient now only has 3 days left of Vimovo and states she has been waiting weeks for [place] to get this approved with her insurance. Not sure who is working on this for this patient?

## 2016-03-11 MED ORDER — NAPROXEN-ESOMEPRAZOLE 500-20 MG PO TBEC: 1.0000 | DELAYED_RELEASE_TABLET | Freq: Two times a day (BID) | ORAL | Status: DC

## 2016-03-11 NOTE — Telephone Encounter (Signed)
It's probably not approved because she requested it to be sent to her mail order pharmacy, I'm going to send it in again to [facility] in [place], if still not approved I can simply prescribed separate generics for naproxen and Nexium.

## 2016-03-17 ENCOUNTER — Other Ambulatory Visit: Payer: Self-pay | Admitting: Family Medicine

## 2016-03-23 ENCOUNTER — Encounter: Payer: Self-pay | Admitting: Sports Medicine

## 2016-03-23 ENCOUNTER — Ambulatory Visit (INDEPENDENT_AMBULATORY_CARE_PROVIDER_SITE_OTHER): Payer: Medicare Other | Admitting: Sports Medicine

## 2016-03-23 DIAGNOSIS — M79672 Pain in left foot: Secondary | ICD-10-CM

## 2016-03-23 DIAGNOSIS — M79671 Pain in right foot: Secondary | ICD-10-CM

## 2016-03-23 DIAGNOSIS — M7501 Adhesive capsulitis of right shoulder: Secondary | ICD-10-CM | POA: Diagnosis not present

## 2016-03-23 DIAGNOSIS — M797 Fibromyalgia: Secondary | ICD-10-CM | POA: Diagnosis not present

## 2016-03-23 MED ORDER — GABAPENTIN 300 MG PO CAPS: ORAL_CAPSULE | ORAL | 3 refills | Status: DC

## 2016-03-23 NOTE — Assessment & Plan Note (Signed)
Left first IP and left fifth PIP are fine after injection.

## 2016-03-23 NOTE — Assessment & Plan Note (Signed)
Already on Cymbalta, adding gabapentin. There likely is an element of cervical degenerative disc disease however we will treat her myofascial pain syndrome first

## 2016-03-23 NOTE — Assessment & Plan Note (Signed)
Resolved with injection.,

## 2016-03-23 NOTE — Progress Notes (Signed)
  Subjective:    CC: Follow-up  HPI: Foot pain: Resolved after injections  Right frozen shoulder: Resolved after glenohumeral injection, full range of motion.  Neck pain: Persistent, with exquisite tenderness to palpation over the trapezius, shoulders, neck, back, hips. He does tell me she has a history of fibromyalgia. Did well with gabapentin in the distant past.  Past medical history, Surgical history, Family history not pertinant except as noted below, Social history, Allergies, and medications have been entered into the medical record, reviewed, and no changes needed.   Review of Systems: No fevers, chills, night sweats, weight loss, chest pain, or shortness of breath.   Objective:    General: Well Developed, well nourished, and in no acute distress.  Neuro: Alert and oriented x3, extra-ocular muscles intact, sensation grossly intact.  HEENT: Normocephalic, atraumatic, pupils equal round reactive to light, neck supple, no masses, no lymphadenopathy, thyroid nonpalpable.  Skin: Warm and dry, no rashes. Cardiac: Regular rate and rhythm, no murmurs rubs or gallops, no lower extremity edema.  Respiratory: Clear to auscultation bilaterally. Not using accessory muscles, speaking in full sentences. Right Shoulder: Inspection reveals no abnormalities, atrophy or asymmetry. Palpation is normal with no tenderness over AC joint or bicipital groove. ROM is full in all planes. Rotator cuff strength normal throughout. No signs of impingement with negative Neer and Hawkin's tests, empty can. Speeds and Yergason's tests normal. No labral pathology noted with negative Obrien's, negative crank, negative clunk, and good stability. Normal scapular function observed. No painful arc and no drop arm sign. No apprehension sign Exquisite tenderness to palpation over all of her neck, shoulders, back, hips.  Impression and Recommendations:    Adhesive capsulitis of right shoulder Resolved with  injection.,  Bilateral foot pain Left first IP and left fifth PIP are fine after injection.  Fibromyalgia Already on Cymbalta, adding gabapentin. There likely is an element of cervical degenerative disc disease however we will treat her myofascial pain syndrome first  I spent 25 minutes with this patient, greater than 50% was face-to-face time counseling regarding the above diagnoses

## 2016-03-30 ENCOUNTER — Telehealth: Payer: Self-pay | Admitting: Cardiology

## 2016-03-30 ENCOUNTER — Ambulatory Visit (INDEPENDENT_AMBULATORY_CARE_PROVIDER_SITE_OTHER): Payer: Medicare Other | Admitting: *Deleted

## 2016-03-30 DIAGNOSIS — I442 Atrioventricular block, complete: Secondary | ICD-10-CM | POA: Diagnosis not present

## 2016-03-30 NOTE — Telephone Encounter (Signed)
Spoke with pt and reminded pt of remote transmission that is due today. Pt verbalized understanding.   

## 2016-03-30 NOTE — Progress Notes (Signed)
Remote pacemaker transmission.   

## 2016-04-01 ENCOUNTER — Encounter: Payer: Self-pay | Admitting: Cardiology

## 2016-04-02 LAB — CUP PACEART REMOTE DEVICE CHECK
Brady Statistic AP VS Percent: 0.07 %
Brady Statistic AS VP Percent: 37.38 %
Date Time Interrogation Session: 20170807170423
Implantable Lead Location: 753859
Lead Channel Impedance Value: 1045 Ohm
Lead Channel Impedance Value: 1216 Ohm
Lead Channel Impedance Value: 399 Ohm
Lead Channel Impedance Value: 4047 Ohm
Lead Channel Impedance Value: 4047 Ohm
Lead Channel Impedance Value: 494 Ohm
Lead Channel Pacing Threshold Pulse Width: 0.4 ms
Lead Channel Sensing Intrinsic Amplitude: 5.875 mV
Lead Channel Setting Pacing Amplitude: 2.25 V
Lead Channel Setting Pacing Pulse Width: 0.8 ms
Lead Channel Setting Sensing Sensitivity: 4 mV
MDC IDC LEAD IMPLANT DT: 20030324
MDC IDC LEAD IMPLANT DT: 20030324
MDC IDC LEAD IMPLANT DT: 20030324
MDC IDC LEAD LOCATION: 753858
MDC IDC LEAD LOCATION: 753860
MDC IDC MSMT BATTERY REMAINING LONGEVITY: 57 mo
MDC IDC MSMT BATTERY VOLTAGE: 3 V
MDC IDC MSMT LEADCHNL LV IMPEDANCE VALUE: 4047 Ohm
MDC IDC MSMT LEADCHNL RA IMPEDANCE VALUE: 437 Ohm
MDC IDC MSMT LEADCHNL RA PACING THRESHOLD AMPLITUDE: 0.75 V
MDC IDC MSMT LEADCHNL RA PACING THRESHOLD PULSEWIDTH: 0.4 ms
MDC IDC MSMT LEADCHNL RA SENSING INTR AMPL: 1.75 mV
MDC IDC MSMT LEADCHNL RA SENSING INTR AMPL: 1.75 mV
MDC IDC MSMT LEADCHNL RV IMPEDANCE VALUE: 627 Ohm
MDC IDC MSMT LEADCHNL RV PACING THRESHOLD AMPLITUDE: 2 V
MDC IDC MSMT LEADCHNL RV SENSING INTR AMPL: 5.875 mV
MDC IDC SET LEADCHNL RA PACING AMPLITUDE: 1.5 V
MDC IDC SET LEADCHNL RV PACING AMPLITUDE: 4 V
MDC IDC SET LEADCHNL RV PACING PULSEWIDTH: 0.4 ms
MDC IDC STAT BRADY AP VP PERCENT: 61.06 %
MDC IDC STAT BRADY AS VS PERCENT: 1.48 %
MDC IDC STAT BRADY RA PERCENT PACED: 61.13 %
MDC IDC STAT BRADY RV PERCENT PACED: 98.44 %

## 2016-04-20 ENCOUNTER — Ambulatory Visit (INDEPENDENT_AMBULATORY_CARE_PROVIDER_SITE_OTHER): Payer: Medicare Other | Admitting: Sports Medicine

## 2016-04-20 DIAGNOSIS — M797 Fibromyalgia: Secondary | ICD-10-CM

## 2016-04-20 MED ORDER — DULOXETINE HCL 60 MG PO CPEP: 60.0000 mg | ORAL_CAPSULE | Freq: Every day | ORAL | 3 refills | Status: DC

## 2016-04-20 MED ORDER — GABAPENTIN 300 MG PO CAPS: ORAL_CAPSULE | ORAL | 3 refills | Status: DC

## 2016-04-20 NOTE — Progress Notes (Signed)
  Subjective:    CC: fibromyalgia follow-up  HPI: 65 yo with history of myofascial pain syndrome presenting for one month follow-up after starting gabapentin. Patient was previously on Cymbalta but continued to have fibromyalgia pain throughout body, most pronounced in L shoulder and back.  Patient was started on gabapentin 367m one month ago and has been taking 3 tablets per day, once in the morning, once mid-day and once before bed.  She has tolerated the medication well and it has helped resolved the majority of her fibromyalgia pain.  She does mention she has felt tired recently, but is unsure if it is related to the gabapentin.  She has also been constipated recently, and has tried milk of magnesia and a high fiber diet with only mild resolution of constipation.  She has not tried Miralax because she is allergic, saying it causes "leaky bowels".    Past medical history, Surgical history, Family history not pertinant except as noted below, Social history, Allergies, and medications have been entered into the medical record, reviewed, and no changes needed.   Review of Systems: No fevers, chills, night sweats, weight loss, chest pain, or shortness of breath.   Objective:    General: Well Developed, well nourished, and in no acute distress.  Neuro: Alert and oriented x3, extra-ocular muscles intact, sensation grossly intact.  HEENT: Normocephalic, atraumatic, pupils equal round reactive to light, neck supple, no masses, no lymphadenopathy, thyroid nonpalpable.  Skin: Warm and dry, no rashes. Cardiac: Regular rate and rhythm, no murmurs rubs or gallops, no lower extremity edema.  Respiratory: Clear to auscultation bilaterally. Not using accessory muscles, speaking in full sentences.   Impression and Recommendations:    1. Fibromyalgia pain - Pain improved with gabapentin.  Also endorses fatigue, especially in the morning. PHQ-9 13.   -Increase Cymbalta to 624m(high PHQ-9 score)  -Continue  gabapentin 30039mbut change schedule to 1 tab mid-day, 2 tabs at nighttime to help with fatigue. -Return in 1 month for follow-up

## 2016-04-20 NOTE — Assessment & Plan Note (Addendum)
Depression is uncontrolled, also with some fatigue, we will increase her Cymbalta to 60 mg daily. Her gabapentin is creating some morning absentmindedness, so we are going to discontinue the morning dose of gabapentin, she will to 300 mg midday and 600 mg at bedtime. Return in one month.  I spent 40 minutes with this patient, greater than 50% was face-to-face time counseling regarding the above diagnoses

## 2016-05-08 DIAGNOSIS — G4733 Obstructive sleep apnea (adult) (pediatric): Secondary | ICD-10-CM | POA: Diagnosis not present

## 2016-05-12 DIAGNOSIS — J3081 Allergic rhinitis due to animal (cat) (dog) hair and dander: Secondary | ICD-10-CM | POA: Diagnosis not present

## 2016-05-12 DIAGNOSIS — J301 Allergic rhinitis due to pollen: Secondary | ICD-10-CM | POA: Diagnosis not present

## 2016-05-12 DIAGNOSIS — J3089 Other allergic rhinitis: Secondary | ICD-10-CM | POA: Diagnosis not present

## 2016-05-12 DIAGNOSIS — J4540 Moderate persistent asthma, uncomplicated: Secondary | ICD-10-CM | POA: Diagnosis not present

## 2016-05-18 ENCOUNTER — Encounter: Payer: Self-pay | Admitting: Sports Medicine

## 2016-05-18 ENCOUNTER — Ambulatory Visit (INDEPENDENT_AMBULATORY_CARE_PROVIDER_SITE_OTHER): Payer: Medicare Other | Admitting: Sports Medicine

## 2016-05-18 DIAGNOSIS — M797 Fibromyalgia: Secondary | ICD-10-CM | POA: Diagnosis not present

## 2016-05-18 DIAGNOSIS — Z23 Encounter for immunization: Secondary | ICD-10-CM

## 2016-05-18 NOTE — Progress Notes (Signed)
  Subjective:    CC: fibromyalgia/depression one month follow-up  HPI: 65 yo with history of fibromyalgia presenting for one month follow-up after increasing Cymbalta to 35m.  She says her fibromyalgia pain has gotten much better, and she does not have any complaints at this time.  In fact, she has decreased her gabapentin to 3034mat suppertime.  She continues to have trouble falling asleep at night.  She says she gets drowsy but once she lies in bed she has trouble falling asleep.  She says her mood has been good recently, and she's recently lost weight, which she is excited about.  She would like to continue Cymbalta 6050m     Past medical history:  Negative.  See flowsheet/record as well for more information.  Surgical history: Negative.  See flowsheet/record as well for more information.  Family history: Negative.  See flowsheet/record as well for more information.  Social history: Negative.  See flowsheet/record as well for more information.  Allergies, and medications have been entered into the medical record, reviewed, and no changes needed.   Review of Systems: No fevers, chills, night sweats, weight loss, chest pain, or shortness of breath.   Objective:    General: Well Developed, well nourished, and in no acute distress.  Neuro: Alert and oriented x3, extra-ocular muscles intact, sensation grossly intact.  HEENT: Normocephalic, atraumatic, pupils equal round reactive to light, neck supple, no masses, no lymphadenopathy, thyroid nonpalpable.  Skin: Warm and dry, no rashes. Cardiac: Regular rate and rhythm, no murmurs rubs or gallops, no lower extremity edema.  Respiratory: Clear to auscultation bilaterally. Not using accessory muscles, speaking in full sentences.   Impression and Recommendations:    1. Myofascial pain syndrome: Remarkably better after increasing Cymbalta to 80m17mShe has felt so well that she has decreased her dose of gabapentin to 300mg46me daily.  Continues  to have trouble falling asleep at night. PHQ-9 score of 10 today, down from 13 last time -Continue Cymbalta 80mg 39mntinue gabapentin 300mg, 15mtake right before bed rather than after supper to help with sleep -Return PRN

## 2016-05-18 NOTE — Assessment & Plan Note (Signed)
Good improvement in symptoms with increasing Cymbalta to 60. Gabapentin has been decreased to 300 mg at bedtime, I did recommend going up to 600 and bedtime considering some persistent insomnia. Return as needed.

## 2016-05-19 ENCOUNTER — Other Ambulatory Visit: Payer: Self-pay | Admitting: Family Medicine

## 2016-05-21 ENCOUNTER — Ambulatory Visit (INDEPENDENT_AMBULATORY_CARE_PROVIDER_SITE_OTHER): Payer: Medicare Other | Admitting: Family Medicine

## 2016-05-21 ENCOUNTER — Encounter: Payer: Self-pay | Admitting: Family Medicine

## 2016-05-21 VITALS — BP 134/71 | HR 71 | Wt 194.0 lb

## 2016-05-21 DIAGNOSIS — I10 Essential (primary) hypertension: Secondary | ICD-10-CM

## 2016-05-21 DIAGNOSIS — E118 Type 2 diabetes mellitus with unspecified complications: Secondary | ICD-10-CM

## 2016-05-21 DIAGNOSIS — E119 Type 2 diabetes mellitus without complications: Secondary | ICD-10-CM

## 2016-05-21 DIAGNOSIS — K5229 Other allergic and dietetic gastroenteritis and colitis: Secondary | ICD-10-CM

## 2016-05-21 DIAGNOSIS — R198 Other specified symptoms and signs involving the digestive system and abdomen: Secondary | ICD-10-CM

## 2016-05-21 DIAGNOSIS — T781XXA Other adverse food reactions, not elsewhere classified, initial encounter: Secondary | ICD-10-CM | POA: Insufficient documentation

## 2016-05-21 DIAGNOSIS — T7819XA Other adverse food reactions, not elsewhere classified, initial encounter: Secondary | ICD-10-CM | POA: Insufficient documentation

## 2016-05-21 LAB — POCT GLYCOSYLATED HEMOGLOBIN (HGB A1C): Hemoglobin A1C: 5.8

## 2016-05-21 NOTE — Progress Notes (Addendum)
Subjective:    CC: DM HPI: Diabetes - no hypoglycemic events. No wounds or sores that are not healing well. No increased thirst or urination. Checking glucose at home. Taking medications as prescribed without any side effects.She is completely changed her diet around and is hoping her A1c looks great today.  Hypertension- Pt denies chest pain, SOB, dizziness, or heart palpitations.  Taking meds as directed w/o problems.  Denies medication side effects.    Fibromyalgia - Her gabapentin was increased recently.  She says that it has been helping but positive making her feel sleepy. He has been trying to walk some for exercise.  Food sensitivity-she has says she has done fantastic since eliminating several things from her diet that she had elevated antibody levels for. Her bowels are moving more normally though she has to take Senokot a couple times. She's no longer is bloated and has lost some 20 pounds.  Past medical history, Surgical history, Family history not pertinant except as noted below, Social history, Allergies, and medications have been entered into the medical record, reviewed, and corrections made.   Review of Systems: No fevers, chills, night sweats, weight gain, chest pain, or shortness of breath.   Objective:    General: Well Developed, well nourished, and in no acute distress.  Neuro: Alert and oriented x3, extra-ocular muscles intact, sensation grossly intact.  HEENT: Normocephalic, atraumatic  Skin: Warm and dry, no rashes. Cardiac: Regular rate and rhythm, no murmurs rubs or gallops, no lower extremity edema.  Respiratory: Clear to auscultation bilaterally. Not using accessory muscles, speaking in full sentences.   Impression and Recommendations:   DM- well controlled.  A1c down to 5.8.   F/U in 3 months. Dec Lantus 15 units.    HTN - Well controlled. Continue current regimen. Follow up in  3-4 mo.   Fibromyalgia - If gabapentin is still sedating in a few weeks may  want to consider to Going back down the dose and then increase in the Cymbalta instead.  Food sensitivity-symptoms much improved and she is also lost some weight.

## 2016-05-21 NOTE — Patient Instructions (Signed)
Decrease lantus to 15 units.

## 2016-05-26 ENCOUNTER — Encounter: Payer: Self-pay | Admitting: Family Medicine

## 2016-06-14 ENCOUNTER — Other Ambulatory Visit: Payer: Self-pay | Admitting: Sports Medicine

## 2016-06-14 DIAGNOSIS — M797 Fibromyalgia: Secondary | ICD-10-CM

## 2016-06-23 DIAGNOSIS — Z853 Personal history of malignant neoplasm of breast: Secondary | ICD-10-CM | POA: Diagnosis not present

## 2016-06-23 DIAGNOSIS — Z8249 Family history of ischemic heart disease and other diseases of the circulatory system: Secondary | ICD-10-CM | POA: Diagnosis not present

## 2016-06-23 DIAGNOSIS — Z7984 Long term (current) use of oral hypoglycemic drugs: Secondary | ICD-10-CM | POA: Diagnosis not present

## 2016-06-23 DIAGNOSIS — R05 Cough: Secondary | ICD-10-CM | POA: Diagnosis not present

## 2016-06-23 DIAGNOSIS — J4540 Moderate persistent asthma, uncomplicated: Secondary | ICD-10-CM | POA: Diagnosis not present

## 2016-06-23 DIAGNOSIS — Z950 Presence of cardiac pacemaker: Secondary | ICD-10-CM | POA: Diagnosis not present

## 2016-06-23 DIAGNOSIS — J9811 Atelectasis: Secondary | ICD-10-CM | POA: Diagnosis not present

## 2016-06-23 DIAGNOSIS — R0602 Shortness of breath: Secondary | ICD-10-CM | POA: Diagnosis not present

## 2016-06-23 DIAGNOSIS — I2510 Atherosclerotic heart disease of native coronary artery without angina pectoris: Secondary | ICD-10-CM | POA: Diagnosis not present

## 2016-06-23 DIAGNOSIS — I442 Atrioventricular block, complete: Secondary | ICD-10-CM | POA: Diagnosis not present

## 2016-06-23 DIAGNOSIS — K219 Gastro-esophageal reflux disease without esophagitis: Secondary | ICD-10-CM | POA: Diagnosis not present

## 2016-06-23 DIAGNOSIS — Z87891 Personal history of nicotine dependence: Secondary | ICD-10-CM | POA: Diagnosis not present

## 2016-06-23 DIAGNOSIS — G4733 Obstructive sleep apnea (adult) (pediatric): Secondary | ICD-10-CM | POA: Diagnosis not present

## 2016-06-29 ENCOUNTER — Telehealth: Payer: Self-pay | Admitting: Cardiology

## 2016-06-29 ENCOUNTER — Ambulatory Visit (INDEPENDENT_AMBULATORY_CARE_PROVIDER_SITE_OTHER): Payer: Medicare Other | Admitting: *Deleted

## 2016-06-29 DIAGNOSIS — I442 Atrioventricular block, complete: Secondary | ICD-10-CM

## 2016-06-29 NOTE — Telephone Encounter (Signed)
Spoke with pt and reminded pt of remote transmission that is due today. Pt verbalized understanding.   

## 2016-06-30 NOTE — Progress Notes (Signed)
Remote pacemaker transmission.   

## 2016-07-01 ENCOUNTER — Encounter: Payer: Self-pay | Admitting: Cardiology

## 2016-07-07 ENCOUNTER — Other Ambulatory Visit: Payer: Self-pay | Admitting: Family Medicine

## 2016-07-12 LAB — CUP PACEART REMOTE DEVICE CHECK
Battery Remaining Longevity: 54 mo
Brady Statistic RA Percent Paced: 68.16 %
Brady Statistic RV Percent Paced: 97.67 %
Implantable Lead Implant Date: 20030324
Implantable Lead Implant Date: 20030324
Implantable Lead Location: 753858
Implantable Lead Location: 753860
Implantable Lead Model: 4068
Implantable Lead Model: 4068
Implantable Lead Model: 4193
Implantable Pulse Generator Implant Date: 20151231
Lead Channel Impedance Value: 1216 Ohm
Lead Channel Impedance Value: 418 Ohm
Lead Channel Impedance Value: 475 Ohm
Lead Channel Pacing Threshold Amplitude: 0.75 V
Lead Channel Pacing Threshold Amplitude: 2 V
Lead Channel Pacing Threshold Pulse Width: 0.4 ms
Lead Channel Sensing Intrinsic Amplitude: 5.75 mV
Lead Channel Setting Pacing Amplitude: 1.5 V
Lead Channel Setting Pacing Amplitude: 2.25 V
MDC IDC LEAD IMPLANT DT: 20030324
MDC IDC LEAD LOCATION: 753859
MDC IDC MSMT BATTERY VOLTAGE: 3 V
MDC IDC MSMT LEADCHNL LV IMPEDANCE VALUE: 1045 Ohm
MDC IDC MSMT LEADCHNL LV IMPEDANCE VALUE: 4047 Ohm
MDC IDC MSMT LEADCHNL LV IMPEDANCE VALUE: 4047 Ohm
MDC IDC MSMT LEADCHNL LV IMPEDANCE VALUE: 4047 Ohm
MDC IDC MSMT LEADCHNL RA PACING THRESHOLD PULSEWIDTH: 0.4 ms
MDC IDC MSMT LEADCHNL RA SENSING INTR AMPL: 1.5 mV
MDC IDC MSMT LEADCHNL RA SENSING INTR AMPL: 1.5 mV
MDC IDC MSMT LEADCHNL RV IMPEDANCE VALUE: 437 Ohm
MDC IDC MSMT LEADCHNL RV IMPEDANCE VALUE: 570 Ohm
MDC IDC MSMT LEADCHNL RV SENSING INTR AMPL: 5.75 mV
MDC IDC SESS DTM: 20171106134104
MDC IDC SET LEADCHNL LV PACING PULSEWIDTH: 0.8 ms
MDC IDC SET LEADCHNL RV PACING AMPLITUDE: 4 V
MDC IDC SET LEADCHNL RV PACING PULSEWIDTH: 0.4 ms
MDC IDC SET LEADCHNL RV SENSING SENSITIVITY: 4 mV
MDC IDC STAT BRADY AP VP PERCENT: 67.65 %
MDC IDC STAT BRADY AP VS PERCENT: 0.51 %
MDC IDC STAT BRADY AS VP PERCENT: 30.02 %
MDC IDC STAT BRADY AS VS PERCENT: 1.82 %

## 2016-07-31 ENCOUNTER — Other Ambulatory Visit: Payer: Self-pay | Admitting: Family Medicine

## 2016-07-31 DIAGNOSIS — Z853 Personal history of malignant neoplasm of breast: Secondary | ICD-10-CM

## 2016-08-13 ENCOUNTER — Telehealth: Payer: Self-pay | Admitting: Adult Health

## 2016-08-13 NOTE — Telephone Encounter (Signed)
Patient called because she received a letter stating that she needed to call and reschedule her 3/14 appointment with Tanya Hogan. Rescheduled to 3/12 with Renato Battles.

## 2016-08-19 DIAGNOSIS — G4733 Obstructive sleep apnea (adult) (pediatric): Secondary | ICD-10-CM | POA: Diagnosis not present

## 2016-08-19 DIAGNOSIS — M79674 Pain in right toe(s): Secondary | ICD-10-CM | POA: Diagnosis not present

## 2016-08-20 ENCOUNTER — Ambulatory Visit (INDEPENDENT_AMBULATORY_CARE_PROVIDER_SITE_OTHER): Payer: Medicare Other

## 2016-08-20 ENCOUNTER — Encounter: Payer: Self-pay | Admitting: Family Medicine

## 2016-08-20 ENCOUNTER — Other Ambulatory Visit: Payer: Self-pay | Admitting: Sports Medicine

## 2016-08-20 ENCOUNTER — Ambulatory Visit (INDEPENDENT_AMBULATORY_CARE_PROVIDER_SITE_OTHER): Payer: Medicare Other | Admitting: Family Medicine

## 2016-08-20 VITALS — BP 122/81 | HR 69 | Ht 61.0 in | Wt 202.0 lb

## 2016-08-20 DIAGNOSIS — E118 Type 2 diabetes mellitus with unspecified complications: Secondary | ICD-10-CM

## 2016-08-20 DIAGNOSIS — M79674 Pain in right toe(s): Secondary | ICD-10-CM

## 2016-08-20 DIAGNOSIS — M7662 Achilles tendinitis, left leg: Secondary | ICD-10-CM

## 2016-08-20 DIAGNOSIS — M25511 Pain in right shoulder: Secondary | ICD-10-CM | POA: Diagnosis not present

## 2016-08-20 DIAGNOSIS — S90221A Contusion of right lesser toe(s) with damage to nail, initial encounter: Secondary | ICD-10-CM | POA: Diagnosis not present

## 2016-08-20 LAB — COMPLETE METABOLIC PANEL WITH GFR
ALBUMIN: 4 g/dL (ref 3.6–5.1)
ALK PHOS: 91 U/L (ref 33–130)
ALT: 13 U/L (ref 6–29)
AST: 17 U/L (ref 10–35)
BUN: 20 mg/dL (ref 7–25)
CALCIUM: 9.4 mg/dL (ref 8.6–10.4)
CO2: 28 mmol/L (ref 20–31)
Chloride: 104 mmol/L (ref 98–110)
Creat: 0.72 mg/dL (ref 0.50–0.99)
GFR, EST NON AFRICAN AMERICAN: 87 mL/min (ref >=60)
GFR, Est African American: >89 mL/min (ref >=60)
Glucose, Bld: 112 mg/dL — ABNORMAL HIGH (ref 65–99)
POTASSIUM: 4.8 mmol/L (ref 3.5–5.3)
Sodium: 139 mmol/L (ref 135–146)
Total Bilirubin: 0.3 mg/dL (ref 0.2–1.2)
Total Protein: 6.8 g/dL (ref 6.1–8.1)

## 2016-08-20 LAB — POCT UA - MICROALBUMIN
Creatinine, POC: 100 mg/dL
MICROALBUMIN (UR) POC: 30 mg/L

## 2016-08-20 LAB — POCT GLYCOSYLATED HEMOGLOBIN (HGB A1C): HEMOGLOBIN A1C: 6.1

## 2016-08-20 NOTE — Progress Notes (Signed)
Subjective:    CC: DM  HPI:  Diabetes - no hypoglycemic events. No wounds or sores that are not healing well. No increased thirst or urination. Checking glucose at home. Taking medications as prescribed without any side effects.  She is c/o of pain in her right upper arm.She says it started when she was trying to get herbal out of the sensing. He went around her and she said she missed that appear wet. It affected her left Achilles tendon and pulled her right shoulder. Prior hx of fracture, no surgical hardware in place.  She has been taking her gabapentin.  Then a week ago she said she fell and tripped in her home. Now her fifth toe on the right foot is bruised and tender and swollen. It's uncomfortable to walk on it.   Past medical history, Surgical history, Family history not pertinant except as noted below, Social history, Allergies, and medications have been entered into the medical record, reviewed, and corrections made.   Review of Systems: No fevers, chills, night sweats, weight loss, chest pain, or shortness of breath.   Objective:    General: Well Developed, well nourished, and in no acute distress.  Neuro: Alert and oriented x3, extra-ocular muscles intact, sensation grossly intact.  HEENT: Normocephalic, atraumatic  Skin: Warm and dry, no rashes. Cardiac: Regular rate and rhythm, no murmurs rubs or gallops, no lower extremity edema.  Respiratory: Clear to auscultation bilaterally. Not using accessory muscles, speaking in full sentences. Ext: Right shoulder-difficulty getting above 90 but she was able to finally do it. She does have some pain directly over the bicep muscle with palpation. She does have some tenderness directly over the left Achilles tendon. But she is able to flex and extend the foot without difficulty and even against resistance. On her right foot she has a swollen and bruised fifth toe. Though she is tender all the way up the fifth metatarsal.   Impression  and Recommendations:    DM- Well controlled. Continue current regimen. Follow up in  3-4 months. A1C of 6.1 today.   Left achilles tendonitis - Given handout on stretches and exercises to do home physical therapy.  Right fifth toe pain and bruising-suspicious for fracture. We'll send for x-ray for further evaluation. Personally reviewed x-ray did not see any sign of fracture. Given handout on home physical therapy.  Given postop shoe to wear for the next 2 weeks for support and comfort. If not improving then follow-up. If review by radiology gets a different report and we will contact with that information and make adjustments to her treatment plan. Recommend ice as needed.  Right shoulder pain - I really think that she probably had a partial bicep tear and she also seems like she might be getting some early frozen shoulder with her right shoulder as well. I think she would really benefit from formal physical therapy but she is worried about the cost of co-pay etc. I did encourage her to check with her insurance. But for now we'll go ahead and give her handout with some stretches and exercises to do on her own at home and if she is able to afford the PT she will let me know and we'll place referral.

## 2016-08-27 DIAGNOSIS — D3131 Benign neoplasm of right choroid: Secondary | ICD-10-CM | POA: Diagnosis not present

## 2016-09-03 ENCOUNTER — Ambulatory Visit
Admission: RE | Admit: 2016-09-03 | Discharge: 2016-09-03 | Disposition: A | Discharge status: Home / Self Care | Payer: Medicare Other | Source: Ambulatory Visit | Attending: Family Medicine | Admitting: Family Medicine

## 2016-09-03 DIAGNOSIS — Z853 Personal history of malignant neoplasm of breast: Secondary | ICD-10-CM

## 2016-09-03 DIAGNOSIS — R928 Other abnormal and inconclusive findings on diagnostic imaging of breast: Secondary | ICD-10-CM | POA: Diagnosis not present

## 2016-09-15 ENCOUNTER — Telehealth: Payer: Self-pay | Admitting: *Deleted

## 2016-09-15 DIAGNOSIS — I10 Essential (primary) hypertension: Secondary | ICD-10-CM

## 2016-09-15 DIAGNOSIS — I5022 Chronic systolic (congestive) heart failure: Secondary | ICD-10-CM

## 2016-09-15 NOTE — Telephone Encounter (Signed)
Pt stated that she started having trouble breathing and did not associate this with her heart she that this was due to her asthma. She stated that she had been taking Aleve and gabapentin(gabapentin makes her think slower). She stated that she stopped the Aleve due to the nosebleeds and swelling.   She stated that she had an appt with the liver study and they told her to stop the aleve.which she did. Pt stated that she is feeling better now. She has lost 5lbs of the 10 that she gained from the swelling. She is taking the bumex to help with the swelling.   Will fwd to pcp.

## 2016-09-16 NOTE — Telephone Encounter (Signed)
OK, just need to make sure we check kidneys since on the bumex if hasn't been done recently.

## 2016-09-17 ENCOUNTER — Other Ambulatory Visit: Payer: Self-pay | Admitting: Sports Medicine

## 2016-09-18 NOTE — Telephone Encounter (Signed)
Patient notified

## 2016-09-23 DIAGNOSIS — I5022 Chronic systolic (congestive) heart failure: Secondary | ICD-10-CM | POA: Diagnosis not present

## 2016-09-23 DIAGNOSIS — I10 Essential (primary) hypertension: Secondary | ICD-10-CM | POA: Diagnosis not present

## 2016-09-23 NOTE — Telephone Encounter (Signed)
Order for cmp and micro placed and given to patient.Tanya Hogan Tanya Hogan

## 2016-09-23 NOTE — Addendum Note (Signed)
Addended by: Teddy Spike on: 09/23/2016 12:27 PM   Modules accepted: Orders

## 2016-09-24 ENCOUNTER — Ambulatory Visit (INDEPENDENT_AMBULATORY_CARE_PROVIDER_SITE_OTHER): Payer: Medicare Other

## 2016-09-24 ENCOUNTER — Ambulatory Visit (INDEPENDENT_AMBULATORY_CARE_PROVIDER_SITE_OTHER): Payer: Medicare Other | Admitting: Sports Medicine

## 2016-09-24 ENCOUNTER — Encounter: Payer: Self-pay | Admitting: Sports Medicine

## 2016-09-24 DIAGNOSIS — M7501 Adhesive capsulitis of right shoulder: Secondary | ICD-10-CM

## 2016-09-24 DIAGNOSIS — F321 Major depressive disorder, single episode, moderate: Secondary | ICD-10-CM | POA: Diagnosis not present

## 2016-09-24 DIAGNOSIS — M25572 Pain in left ankle and joints of left foot: Secondary | ICD-10-CM | POA: Diagnosis not present

## 2016-09-24 DIAGNOSIS — G8929 Other chronic pain: Secondary | ICD-10-CM | POA: Diagnosis not present

## 2016-09-24 DIAGNOSIS — M19072 Primary osteoarthritis, left ankle and foot: Secondary | ICD-10-CM | POA: Insufficient documentation

## 2016-09-24 DIAGNOSIS — S99912A Unspecified injury of left ankle, initial encounter: Secondary | ICD-10-CM | POA: Diagnosis not present

## 2016-09-24 LAB — COMPLETE METABOLIC PANEL WITH GFR
ALT: 12 U/L (ref 6–29)
AST: 18 U/L (ref 10–35)
Albumin: 4.2 g/dL (ref 3.6–5.1)
Alkaline Phosphatase: 94 U/L (ref 33–130)
BUN: 16 mg/dL (ref 7–25)
CALCIUM: 10 mg/dL (ref 8.6–10.4)
CHLORIDE: 107 mmol/L (ref 98–110)
CO2: 22 mmol/L (ref 20–31)
Creat: 0.9 mg/dL (ref 0.50–0.99)
GFR, EST AFRICAN AMERICAN: 77 mL/min (ref >=60)
GFR, EST NON AFRICAN AMERICAN: 66 mL/min (ref >=60)
Glucose, Bld: 114 mg/dL — ABNORMAL HIGH (ref 65–99)
POTASSIUM: 4.5 mmol/L (ref 3.5–5.3)
Sodium: 141 mmol/L (ref 135–146)
Total Bilirubin: 0.3 mg/dL (ref 0.2–1.2)
Total Protein: 7.1 g/dL (ref 6.1–8.1)

## 2016-09-24 MED ORDER — DULOXETINE HCL 60 MG PO CPEP: 120.0000 mg | ORAL_CAPSULE | Freq: Every day | ORAL | 3 refills | Status: DC

## 2016-09-24 NOTE — Assessment & Plan Note (Signed)
Pain over the posterior tibiotalar joint and a little bit over the proximal Achilles tendon. X-rays of the ankle, MRI. Return to see me for MRI results, we will likely need to do formal aggressive physical therapy for her ankle.

## 2016-09-24 NOTE — Assessment & Plan Note (Signed)
Recurrence of adhesive capsulitis 7 months after the previous episode. Repeat right glenohumeral injection, she is currently between the inflammatory and the freezing phase. Return in one month.

## 2016-09-24 NOTE — Assessment & Plan Note (Signed)
Tearful in exam room suggestive of uncontrolled depression.  Increasing Cymbalta to 120 mg daily.  Return to see me in one month regarding this.

## 2016-09-24 NOTE — Progress Notes (Signed)
Subjective:    I'm seeing this patient as a consultation for:  Dr. Beatrice Lecher  CC: left ankle pain, right shoulder pain  HPI: For the past couple of months this pleasant 66 year old female has had severe pain that she localizes on the posterior aspect of her ankle, she's had rehabilitation, has never had x-rays of the ankle or advanced imaging.  Pain is moderate, persistent.  Right shoulder pain: history of adhesive capsulitis, injected 7 months ago, and did extremely well, now having recurrence of pain over her deltoid, and severe lack of range of motion.  Past medical history:  Negative.  See flowsheet/record as well for more information.  Surgical history: Negative.  See flowsheet/record as well for more information.  Family history: Negative.  See flowsheet/record as well for more information.  Social history: Negative.  See flowsheet/record as well for more information.  Allergies, and medications have been entered into the medical record, reviewed, and no changes needed.   Review of Systems: No headache, visual changes, nausea, vomiting, diarrhea, constipation, dizziness, abdominal pain, skin rash, fevers, chills, night sweats, weight loss, swollen lymph nodes, body aches, joint swelling, muscle aches, chest pain, shortness of breath, mood changes, visual or auditory hallucinations.   Objective:   General: Well Developed, well nourished, and in no acute distress.  Neuro/Psych: Alert and oriented x3, extra-ocular muscles intact, able to move all 4 extremities, sensation grossly intact. Skin: Warm and dry, no rashes noted.  Respiratory: Not using accessory muscles, speaking in full sentences, trachea midline.  Cardiovascular: Pulses palpable, no extremity edema. Abdomen: Does not appear distended. Right Shoulder: Inspection reveals no abnormalities, atrophy or asymmetry. Palpation is normal with no tenderness over AC joint or bicipital groove. Severe lack of range of  motion Rotator cuff strength normal throughout. No signs of impingement with negative Neer and Hawkin's tests, empty can. Speeds and Yergason's tests normal. No labral pathology noted with negative Obrien's, negative crank, negative clunk, and good stability. Normal scapular function observed. No painful arc and no drop arm sign. No apprehension sign LeftAnkle: No visible erythema or swelling. Range of motion is full in all directions. Strength is 5/5 in all directions. Stable lateral and medial ligaments; squeeze test and kleiger test unremarkable; Talar dome nontender; No pain at base of 5th MT; No tenderness over cuboid; No tenderness over N spot or navicular prominence No tenderness on posterior aspects of lateral and medial malleolus No sign of peroneal tendon subluxations; Negative tarsal tunnel tinel's Tender to palpation at the posterior tibiotalar joint as well as over the proximal Achilles at the musculotendinous junction  Procedure: Real-time Ultrasound Guided Injection of right glenohumeral joint. Device: GE Logiq E  Verbal informed consent obtained.  Time-out conducted.  Noted no overlying erythema, induration, or other signs of local infection.  Skin prepped in a sterile fashion.  Local anesthesia: Topical Ethyl chloride.  With sterile technique and under real time ultrasound guidance:  Using a 22-gauge spinal needle advanced into the glenohumeral joint taking care to avoid the labrum, I then injected 1 mL kenalog 40, 2 mL lidocaine, 2 mL Marcaine. Completed without difficulty  Pain immediately resolved suggesting accurate placement of the medication.  Advised to call if fevers/chills, erythema, induration, drainage, or persistent bleeding.  Images permanently stored and available for review in the ultrasound unit.  Impression: Technically successful ultrasound guided injection.  Impression and Recommendations:   This case required medical decision making of moderate  complexity.  Adhesive capsulitis of right shoulder Recurrence of  adhesive capsulitis 7 months after the previous episode. Repeat right glenohumeral injection, she is currently between the inflammatory and the freezing phase. Return in one month.  Left ankle pain Pain over the posterior tibiotalar joint and a little bit over the proximal Achilles tendon. X-rays of the ankle, MRI. Return to see me for MRI results, we will likely need to do formal aggressive physical therapy for her ankle.  Major depressive disorder Tearful in exam room suggestive of uncontrolled depression.  Increasing Cymbalta to 120 mg daily.  Return to see me in one month regarding this.

## 2016-09-25 LAB — MICROALBUMIN, URINE: MICROALB UR: 0.8 mg/dL

## 2016-09-28 ENCOUNTER — Ambulatory Visit: Payer: Medicare Other | Admitting: Family Medicine

## 2016-09-28 MED ORDER — ATORVASTATIN CALCIUM 20 MG PO TABS: 20.0000 mg | ORAL_TABLET | Freq: Every day | ORAL | 3 refills | Status: DC

## 2016-09-28 NOTE — Addendum Note (Signed)
Addended by: Beatrice Lecher D on: 09/28/2016 08:29 AM   Modules accepted: Orders

## 2016-09-29 ENCOUNTER — Encounter: Payer: Self-pay | Admitting: Family Medicine

## 2016-09-29 ENCOUNTER — Other Ambulatory Visit: Payer: Self-pay | Admitting: Family Medicine

## 2016-09-29 ENCOUNTER — Ambulatory Visit (INDEPENDENT_AMBULATORY_CARE_PROVIDER_SITE_OTHER): Payer: Medicare Other | Admitting: Family Medicine

## 2016-09-29 VITALS — BP 116/66 | HR 73 | Temp 98.6°F | Wt 200.0 lb

## 2016-09-29 DIAGNOSIS — J0111 Acute recurrent frontal sinusitis: Secondary | ICD-10-CM

## 2016-09-29 MED ORDER — IPRATROPIUM BROMIDE 0.06 % NA SOLN: 2.0000 | NASAL | 6 refills | Status: DC | PRN

## 2016-09-29 MED ORDER — DOXYCYCLINE HYCLATE 100 MG PO TABS: 100.0000 mg | ORAL_TABLET | Freq: Two times a day (BID) | ORAL | 0 refills | Status: DC

## 2016-09-29 NOTE — Progress Notes (Signed)
Tanya Hogan is a 66 y.o. female who presents to [place]: [place] today for facial pain and pressure associated with sore throat ear pain and subjective fever. Symptoms present for about 3 weeks now. She denies any wheezing or shortness of breath. She's tried some over-the-counter medications which have helped only a little. She's concerned about a potential sinus infection. She notes her symptoms are consistent with previous episodes of sinusitis.   Past Medical History:  Diagnosis Date  . Alkaline phosphatase elevation   . Breast cancer ([place]) 2011  . Carpal tunnel syndrome   . CHF (congestive heart failure) ([place])   . Closed fracture of unspecified part of radius (alone)   . DDD (degenerative disc disease), lumbar   . Depression   . Fibromyalgia   . HTN (hypertension)   . Hypothyroidism   . IBS (irritable bowel syndrome)   . LBBB (left bundle branch block)    Past Surgical History:  Procedure Laterality Date  . BREAST LUMPECTOMY    . FOOT SURGERY    . KNEE SURGERY    . PACEMAKER GENERATOR CHANGE N/A 08/23/2014   Procedure: PACEMAKER GENERATOR CHANGE;  Surgeon: Evans Lance, MD;  Location: [facility] CATH LAB;  Service: Cardiovascular;  Laterality: N/A;  . PACEMAKER PLACEMENT  2009   [place] [facility]  . TUBAL LIGATION     Social History  Substance Use Topics  . Smoking status: Former Smoker    Quit date: 08/24/1966  . Smokeless tobacco: Never Used  . Alcohol use No   family history includes Melanoma in her mother.  ROS as above:  Medications: Current Outpatient Prescriptions  Medication Sig Dispense Refill  . ACCU-CHEK FASTCLIX LANCETS MISC Test twice a day 204 each 11  . ACCU-CHEK SMARTVIEW test strip Test two times daily 200 each 11  . acetaminophen (TYLENOL) 325 MG tablet Take 650 mg by mouth.    . AMBULATORY NON FORMULARY MEDICATION Medication Name:  Needs tubing and suplies for her nebulizer machine. Dx COPD. [facility] 1 vial 0  . AMBULATORY NON FORMULARY MEDICATION Fastclix lancets Dx: E11.9 300 each 3  . atorvastatin (LIPITOR) 20 MG tablet Take 1 tablet (20 mg total) by mouth daily. 90 tablet 3  . BD PEN NEEDLE NANO U/F 32G X 4 MM MISC Test two times daily 180 each 11  . Blood Glucose Monitoring Suppl (ACCU-CHEK NANO SMARTVIEW) w/Device KIT Test two times daily 1 kit 0  . bumetanide (BUMEX) 1 MG tablet TAKE 1 TABLET BY MOUTH TWO  TIMES DAILY 180 tablet 1  . BYSTOLIC 10 MG tablet TAKE 1 TABLET BY MOUTH  DAILY 90 tablet 1  . dicyclomine (BENTYL) 20 MG tablet Take 10-20 mg by mouth every 8 (eight) hours as needed for spasms. Take 1/2-1 tablet by mouth every eights hours as needed for cramps  11  . diltiazem (CARDIZEM) 120 MG tablet Take 120 mg by mouth daily.     [name] doxycycline (VIBRA-TABS) 100 MG tablet Take 1 tablet (100 mg total) by mouth 2 (two) times daily. 14 tablet 0  . DULoxetine (CYMBALTA) 60 MG capsule Take 2 capsules (120 mg total) by mouth daily. 180 capsule 3  . EPIPEN 2-PAK 0.3 MG/0.3ML SOAJ injection INJECT 1 PEN INTO MUSCLE OF LATERAL THIGH FOR SEVERE ALLERGIC REACTION. CALL 911 AFTER USING THIS MEDICATION.  2  . Fluticasone-Salmeterol (ADVAIR) 250-50 MCG/DOSE AEPB Frequency:two times daily   Dosage:250-50   MCG/DOSE  Instructions:Advair Diskus (250-50MCG/DOSE MISC, 2  puff  Inhalation two times daily)  Note:    . gabapentin (NEURONTIN) 300 MG capsule TAKE 1 CAPSULE BY MOUTH DAILY AT BEDTIME FOR 1 WEEK, THEN TWICE DAILY FOR 1 WEEK, THEN THREE TIMES DAILY AS NEEDED 270 capsule 3  . ipratropium (ATROVENT) 0.06 % nasal spray USE 2 SPRAYS IN EACH NOSTRIL EVERY 4 HOURS AS NEEDED FOR RHINITIS 135 mL 1  . LANTUS SOLOSTAR 100 UNIT/ML Solostar Pen Inject subcutaneously 10  units daily at 10pm 15 mL 6  . levothyroxine (SYNTHROID, LEVOTHROID) 75 MCG tablet Take 1 tablet (75 mcg total) by mouth daily. 90 tablet 1  . magnesium oxide  (MAG-OX) 400 MG tablet Take 2 tablets (800 mg total) by mouth at bedtime. 90 tablet 3  . PROAIR HFA 108 (90 Base) MCG/ACT inhaler USE 2 PUFFS EVERY 4 HOURS  AS NEEDED 51 g 3  . SPIRIVA HANDIHALER 18 MCG inhalation capsule Inhale the contents of 1  capsule via HandiHaler  daily 30 capsule 0   No current facility-administered medications for this visit.    Allergies  Allergen Reactions  . Penicillins Anaphylaxis  . Accupril [Quinapril Hcl] Other (See Comments)    Hyperkalemia  . Aspirin Other (See Comments)    Pt states "burns holes in stomach"  . Baking Soda-Fluoride [Sodium Fluoride] Itching  . Codeine Nausea And Vomiting  . Fluticasone Other (See Comments)    Nasal ulcer.    . Furosemide Other (See Comments)    Nose bleed  . Lactose Intolerance (Gi) Other (See Comments)    Increased calcium, sneezing, nasal issues  . Losartan Potassium Itching  . Metformin And Related Diarrhea  . Nasonex [Mometasone Furoate] Other (See Comments)    nosebleed  . Quinine     unknown  . Rifampin Other (See Comments)    DOES NOT REMEMBER THIS MEDICATION OR ALLERGY  . Clindamycin/Lincomycin Rash  . Miralax [Polyethylene Glycol] Rash  . Sulfa Antibiotics Rash  . Sulfamethoxazole Rash  . Sulfonamide Derivatives Rash  . Zoledronic Acid Other (See Comments) and Rash    shaking    Health Maintenance Health Maintenance  Topic Date Due  . OPHTHALMOLOGY EXAM  07/31/2016  . HEMOGLOBIN A1C  02/18/2017  . FOOT EXAM  05/21/2017  . URINE MICROALBUMIN  09/23/2017  . PNA vac Low Risk Adult (2 of 2 - PPSV23) 11/28/2017  . MAMMOGRAM  09/03/2018  . COLONOSCOPY  06/13/2019  . TETANUS/TDAP  07/14/2022  . INFLUENZA VACCINE  Completed  . DEXA SCAN  Completed  . ZOSTAVAX  Completed  . Hepatitis C Screening  Completed     Exam:  BP 116/66   Pulse 73   Temp 98.6 F (37 C) (Oral)   Wt 200 lb (90.7 kg)   SpO2 94%   BMI 37.79 kg/m   Gen: Well NAD Nontoxic appearing HEENT: EOMI,  MMM tender to  palpation frontal sinuses bilaterally. Clear nasal discharge. Posterior pharynx with mild cobblestoning. Normal tympanic membranes bilaterally. Lungs: Normal work of breathing. CTABL Heart: RRR no MRG Abd: NABS, Soft. Nondistended, Nontender Exts: Brisk capillary refill, warm and well perfused.    No results found for this or any previous visit (from the past 72 hour(s)). No results found.    Assessment and Plan: 66 y.o. female with sinusitis. Symptoms present for 3 weeks which is a bit long. Will treat with doxycycline and Atrovent nasal spray. Follow-up with PCP as needed.   No orders of the defined types were  placed in this encounter.  Meds ordered this encounter  Medications  . DISCONTD: ipratropium (ATROVENT) 0.06 % nasal spray    Sig: Place 2 sprays into both nostrils every 4 (four) hours as needed for rhinitis.    Dispense:  10 mL    Refill:  6  . doxycycline (VIBRA-TABS) 100 MG tablet    Sig: Take 1 tablet (100 mg total) by mouth 2 (two) times daily.    Dispense:  14 tablet    Refill:  0     Discussed warning signs or symptoms. Please see discharge instructions. Patient expresses understanding.

## 2016-09-29 NOTE — Patient Instructions (Signed)
Thank you for coming in today. Call or go to the emergency room if you get worse, have trouble breathing, have chest pains, or palpitations.    Sinusitis, Adult Sinusitis is soreness and inflammation of your sinuses. Sinuses are hollow spaces in the bones around your face. They are located:  Around your eyes.  In the middle of your forehead.  Behind your nose.  In your cheekbones. Your sinuses and nasal passages are lined with a stringy fluid (mucus). Mucus normally drains out of your sinuses. When your nasal tissues get inflamed or swollen, the mucus can get trapped or blocked so air cannot flow through your sinuses. This lets bacteria, viruses, and funguses grow, and that leads to infection. Follow these instructions at home: Medicines  Take, use, or apply over-the-counter and prescription medicines only as told by your doctor. These may include nasal sprays.  If you were prescribed an antibiotic medicine, take it as told by your doctor. Do not stop taking the antibiotic even if you start to feel better. Hydrate and Humidify  Drink enough water to keep your pee (urine) clear or pale yellow.  Use a cool mist humidifier to keep the humidity level in your home above 50%.  Breathe in steam for 10-15 minutes, 3-4 times a day or as told by your doctor. You can do this in the bathroom while a hot shower is running.  Try not to spend time in cool or dry air. Rest  Rest as much as possible.  Sleep with your head raised (elevated).  Make sure to get enough sleep each night. General instructions  Put a warm, moist washcloth on your face 3-4 times a day or as told by your doctor. This will help with discomfort.  Wash your hands often with soap and water. If there is no soap and water, use hand sanitizer.  Do not smoke. Avoid being around people who are smoking (secondhand smoke).  Keep all follow-up visits as told by your doctor. This is important. Contact a doctor if:  You have  a fever.  Your symptoms get worse.  Your symptoms do not get better within 10 days. Get help right away if:  You have a very bad headache.  You cannot stop throwing up (vomiting).  You have pain or swelling around your face or eyes.  You have trouble seeing.  You feel confused.  Your neck is stiff.  You have trouble breathing. This information is not intended to replace advice given to you by your health care provider. Make sure you discuss any questions you have with your health care provider. Document Released: 01/27/2008 Document Revised: 04/05/2016 Document Reviewed: 06/05/2015 Elsevier Interactive Patient Education  2017 [employer].

## 2016-09-30 ENCOUNTER — Ambulatory Visit (INDEPENDENT_AMBULATORY_CARE_PROVIDER_SITE_OTHER): Payer: Medicare Other | Admitting: Internal Medicine

## 2016-09-30 ENCOUNTER — Encounter: Payer: Self-pay | Admitting: Internal Medicine

## 2016-09-30 VITALS — BP 140/94 | HR 70 | Ht 61.0 in | Wt 201.4 lb

## 2016-09-30 DIAGNOSIS — Z950 Presence of cardiac pacemaker: Secondary | ICD-10-CM

## 2016-09-30 MED ORDER — DILTIAZEM HCL 120 MG PO TABS: 120.0000 mg | ORAL_TABLET | Freq: Two times a day (BID) | ORAL | 3 refills | Status: DC

## 2016-09-30 NOTE — Patient Instructions (Signed)
Medication Instructions:  Your physician has recommended you make the following change in your medication:  1) INCREASE Cardizem to 120 mg twice daily  Labwork: None Ordered   Testing/Procedures: None Ordered   Follow-Up: Your physician wants you to follow-up in: 1 year with Dr. Lovena Le. You will receive a reminder letter in the mail two months in advance. If you don't receive a letter, please call our office to schedule the follow-up appointment.  Remote monitoring is used to monitor your Pacemaker from home. This monitoring reduces the number of office visits required to check your device to one time per year. It allows [place] to keep an eye on the functioning of your device to ensure it is working properly. You are scheduled for a device check from home on 12/30/16. You may send your transmission at any time that day. If you have a wireless device, the transmission will be sent automatically. After your physician reviews your transmission, you will receive a postcard with your next transmission date.    Any Other Special Instructions Will Be Listed Below (If Applicable).     If you need a refill on your cardiac medications before your next appointment, please call your pharmacy.

## 2016-09-30 NOTE — Progress Notes (Signed)
HPI Tanya Hogan returns today for followup. She is pleasant 66 year old woman with a history of obesity, hypertension, sleep apnea, chronic systolic heart failure, status post biventricular pacemaker insertion. She denies chest pain, shortness of breath, or peripheral edema. No syncope. She was unable take ACE inhibitors or ARB's secondary to hyperkalemia.  She denies chest pain. She has class II heart failure symptoms. She denies peripheral edema. No syncope or palpitations.   She has undergone biventricular pacemaker insertion. No complaints. She is frustrated by her inability to lose weight. Her blood pressure has been high. Allergies  Allergen Reactions  . Penicillins Anaphylaxis  . Petrolatum-Zinc Oxide Rash  . Accupril [Quinapril Hcl] Other (See Comments)    Hyperkalemia  . Aspirin Other (See Comments)    Pt states "burns holes in stomach"  . Baking Soda-Fluoride [Sodium Fluoride] Itching  . Codeine Nausea And Vomiting  . Fluticasone Other (See Comments)    Nasal ulcer.    . Furosemide Other (See Comments)    Nose bleed  . Iodinated Diagnostic Agents   . Lactose Intolerance (Gi) Other (See Comments)    Increased calcium, sneezing, nasal issues  . Levocetirizine   . Losartan Potassium Itching  . Metformin And Related Diarrhea  . Nasonex [Mometasone Furoate] Other (See Comments)    nosebleed  . Quinine     unknown  . Rifampin Other (See Comments)    DOES NOT REMEMBER THIS MEDICATION OR ALLERGY  . Clindamycin/Lincomycin Rash  . Miralax [Polyethylene Glycol] Rash  . Sulfa Antibiotics Rash  . Sulfamethoxazole Rash  . Sulfonamide Derivatives Rash  . Zoledronic Acid Other (See Comments) and Rash    shaking     Current Outpatient Prescriptions  Medication Sig Dispense Refill  . ACCU-CHEK FASTCLIX LANCETS MISC Test twice a day 204 each 11  . ACCU-CHEK SMARTVIEW test strip Test two times daily 200 each 11  . acetaminophen (TYLENOL) 325 MG tablet Take 650 mg by mouth.    .  AMBULATORY NON FORMULARY MEDICATION Medication Name: Needs tubing and suplies for her nebulizer machine. Dx COPD. [facility] 1 vial 0  . AMBULATORY NON FORMULARY MEDICATION Fastclix lancets Dx: E11.9 300 each 3  . atorvastatin (LIPITOR) 20 MG tablet Take 1 tablet (20 mg total) by mouth daily. 90 tablet 3  . BD PEN NEEDLE NANO U/F 32G X 4 MM MISC Test two times daily 180 each 11  . Blood Glucose Monitoring Suppl (ACCU-CHEK NANO SMARTVIEW) w/Device KIT Test two times daily 1 kit 0  . bumetanide (BUMEX) 1 MG tablet TAKE 1 TABLET BY MOUTH TWO  TIMES DAILY 180 tablet 1  . BYSTOLIC 10 MG tablet TAKE 1 TABLET BY MOUTH  DAILY 90 tablet 1  . dicyclomine (BENTYL) 20 MG tablet Take 10-20 mg by mouth every 8 (eight) hours as needed for spasms. Take 1/2-1 tablet by mouth every eights hours as needed for cramps  11  . doxycycline (VIBRA-TABS) 100 MG tablet Take 1 tablet (100 mg total) by mouth 2 (two) times daily. 14 tablet 0  . DULoxetine (CYMBALTA) 60 MG capsule Take 2 capsules (120 mg total) by mouth daily. 180 capsule 3  . EPIPEN 2-PAK 0.3 MG/0.3ML SOAJ injection INJECT 1 PEN INTO MUSCLE OF LATERAL THIGH FOR SEVERE ALLERGIC REACTION. CALL 911 AFTER USING THIS MEDICATION.  2  . Fluticasone-Salmeterol (ADVAIR) 250-50 MCG/DOSE AEPB Frequency:two times daily   Dosage:250-50   MCG/DOSE  Instructions:Advair Diskus (250-50MCG/DOSE MISC, 2  puff  Inhalation two times daily)  Note:    .  gabapentin (NEURONTIN) 300 MG capsule TAKE 1 CAPSULE BY MOUTH DAILY AT BEDTIME FOR 1 WEEK, THEN TWICE DAILY FOR 1 WEEK, THEN THREE TIMES DAILY AS NEEDED 270 capsule 3  . ipratropium (ATROVENT) 0.06 % nasal spray USE 2 SPRAYS IN EACH NOSTRIL EVERY 4 HOURS AS NEEDED FOR RHINITIS 135 mL 1  . LANTUS SOLOSTAR 100 UNIT/ML Solostar Pen Inject subcutaneously 10  units daily at 10pm 15 mL 6  . levothyroxine (SYNTHROID, LEVOTHROID) 75 MCG tablet Take 1 tablet (75 mcg total) by mouth daily. 90 tablet 1  . magnesium oxide (MAG-OX) 400 MG  tablet Take 2 tablets (800 mg total) by mouth at bedtime. 90 tablet 3  . PROAIR HFA 108 (90 Base) MCG/ACT inhaler USE 2 PUFFS EVERY 4 HOURS  AS NEEDED 51 g 3  . SPIRIVA HANDIHALER 18 MCG inhalation capsule Inhale the contents of 1  capsule via HandiHaler  daily 30 capsule 0  . diltiazem (CARDIZEM) 120 MG tablet Take 1 tablet (120 mg total) by mouth 2 (two) times daily. 180 tablet 3   No current facility-administered medications for this visit.      Past Medical History:  Diagnosis Date  . Alkaline phosphatase elevation   . Breast cancer ([place]) 2011  . Carpal tunnel syndrome   . CHF (congestive heart failure) ([place])   . Closed fracture of unspecified part of radius (alone)   . DDD (degenerative disc disease), lumbar   . Depression   . Fibromyalgia   . HTN (hypertension)   . Hypothyroidism   . IBS (irritable bowel syndrome)   . LBBB (left bundle branch block)     ROS:   All systems reviewed and negative except as noted in the HPI.   Past Surgical History:  Procedure Laterality Date  . BREAST LUMPECTOMY    . FOOT SURGERY    . KNEE SURGERY    . PACEMAKER GENERATOR CHANGE N/A 08/23/2014   Procedure: PACEMAKER GENERATOR CHANGE;  Surgeon: Evans Lance, MD;  Location: [facility] CATH LAB;  Service: Cardiovascular;  Laterality: N/A;  . PACEMAKER PLACEMENT  2009   [place] [facility]  . TUBAL LIGATION       Family History  Problem Relation Age of Onset  . Heart failure    . Breast cancer    . Hypertension    . Melanoma Mother      Social History   Social History  . Marital status: Divorced    Spouse name: N/A  . Number of children: N/A  . Years of education: N/A   Occupational History  . Not on file.   Social History Main Topics  . Smoking status: Former Smoker    Quit date: 08/24/1966  . Smokeless tobacco: Never Used  . Alcohol use No  . Drug use: No  . Sexual activity: Not Currently   Other Topics Concern  . Not on file   Social History Narrative  . No  narrative on file     BP (!) 140/94 (BP Location: Left Arm, Patient Position: Sitting, Cuff Size: Normal)   Pulse 70   Ht 5' 1"  (1.549 m)   Wt 201 lb 6.4 oz (91.4 kg)   SpO2 98%   BMI 38.05 kg/m   Physical Exam:  Well appearing 25 old woman, NAD HEENT: Unremarkable Neck:   7 cm JVD, no thyromegally Lungs:  Clear with no wheezes, rales, or rhonchi. HEART:  Regular rate rhythm, no murmurs, no rubs, no clicks Abd:  soft, positive bowel sounds, no  organomegally, no rebound, no guarding Ext:  2 plus pulses, no edema, no cyanosis, no clubbing Skin:  No rashes no nodules Neuro:  CN II through XII intact, motor grossly intact  ECG - NSR with BiV pacing  DEVICE  Normal device function.  See PaceArt for details.   Assess/Plan:  1. Chronic systolic heart failure - her symptoms are class 2. She will continue her current meds. 2. HTN heart disease - her blood pressure is not as good. Will have her increase her cardizem to 120 mg twice daily. 3. BiV PPM - she is PM dependent from CHB. Her Medtronic device is working normally. Will follow.   Cristopher Peru, M.D.

## 2016-10-01 ENCOUNTER — Telehealth: Payer: Self-pay | Admitting: Sports Medicine

## 2016-10-01 DIAGNOSIS — G8929 Other chronic pain: Secondary | ICD-10-CM

## 2016-10-01 DIAGNOSIS — M25572 Pain in left ankle and joints of left foot: Principal | ICD-10-CM

## 2016-10-01 NOTE — Telephone Encounter (Signed)
Dagnabbit!  Switching to Ankle CT.

## 2016-10-01 NOTE — Telephone Encounter (Signed)
Pt has a pacemaker that is not MRI compatible. Routing to ordering Provider. MRI order cancelled.

## 2016-10-07 ENCOUNTER — Ambulatory Visit (INDEPENDENT_AMBULATORY_CARE_PROVIDER_SITE_OTHER): Payer: Medicare Other

## 2016-10-07 DIAGNOSIS — M7732 Calcaneal spur, left foot: Secondary | ICD-10-CM | POA: Diagnosis not present

## 2016-10-07 DIAGNOSIS — G8929 Other chronic pain: Secondary | ICD-10-CM

## 2016-10-07 DIAGNOSIS — M25572 Pain in left ankle and joints of left foot: Principal | ICD-10-CM

## 2016-10-08 ENCOUNTER — Ambulatory Visit (INDEPENDENT_AMBULATORY_CARE_PROVIDER_SITE_OTHER): Payer: Medicare Other | Admitting: Sports Medicine

## 2016-10-08 ENCOUNTER — Encounter: Payer: Self-pay | Admitting: Sports Medicine

## 2016-10-08 DIAGNOSIS — Z79899 Other long term (current) drug therapy: Secondary | ICD-10-CM | POA: Diagnosis not present

## 2016-10-08 DIAGNOSIS — I509 Heart failure, unspecified: Secondary | ICD-10-CM | POA: Diagnosis not present

## 2016-10-08 DIAGNOSIS — E119 Type 2 diabetes mellitus without complications: Secondary | ICD-10-CM | POA: Diagnosis not present

## 2016-10-08 DIAGNOSIS — S99912A Unspecified injury of left ankle, initial encounter: Secondary | ICD-10-CM | POA: Diagnosis not present

## 2016-10-08 DIAGNOSIS — G4733 Obstructive sleep apnea (adult) (pediatric): Secondary | ICD-10-CM | POA: Diagnosis not present

## 2016-10-08 DIAGNOSIS — M25572 Pain in left ankle and joints of left foot: Secondary | ICD-10-CM | POA: Diagnosis not present

## 2016-10-08 DIAGNOSIS — M7989 Other specified soft tissue disorders: Secondary | ICD-10-CM | POA: Diagnosis not present

## 2016-10-08 DIAGNOSIS — Z885 Allergy status to narcotic agent status: Secondary | ICD-10-CM | POA: Diagnosis not present

## 2016-10-08 DIAGNOSIS — M25472 Effusion, left ankle: Secondary | ICD-10-CM | POA: Diagnosis not present

## 2016-10-08 DIAGNOSIS — K589 Irritable bowel syndrome without diarrhea: Secondary | ICD-10-CM | POA: Diagnosis not present

## 2016-10-08 DIAGNOSIS — Z794 Long term (current) use of insulin: Secondary | ICD-10-CM | POA: Diagnosis not present

## 2016-10-08 DIAGNOSIS — Z950 Presence of cardiac pacemaker: Secondary | ICD-10-CM | POA: Diagnosis not present

## 2016-10-08 DIAGNOSIS — Z87891 Personal history of nicotine dependence: Secondary | ICD-10-CM | POA: Diagnosis not present

## 2016-10-08 DIAGNOSIS — K219 Gastro-esophageal reflux disease without esophagitis: Secondary | ICD-10-CM | POA: Diagnosis not present

## 2016-10-08 DIAGNOSIS — Z880 Allergy status to penicillin: Secondary | ICD-10-CM | POA: Diagnosis not present

## 2016-10-08 DIAGNOSIS — G8929 Other chronic pain: Secondary | ICD-10-CM

## 2016-10-08 DIAGNOSIS — Z888 Allergy status to other drugs, medicaments and biological substances status: Secondary | ICD-10-CM | POA: Diagnosis not present

## 2016-10-08 DIAGNOSIS — J449 Chronic obstructive pulmonary disease, unspecified: Secondary | ICD-10-CM | POA: Diagnosis not present

## 2016-10-08 DIAGNOSIS — B379 Candidiasis, unspecified: Secondary | ICD-10-CM | POA: Diagnosis not present

## 2016-10-08 DIAGNOSIS — Z882 Allergy status to sulfonamides status: Secondary | ICD-10-CM | POA: Diagnosis not present

## 2016-10-08 DIAGNOSIS — I110 Hypertensive heart disease with heart failure: Secondary | ICD-10-CM | POA: Diagnosis not present

## 2016-10-08 DIAGNOSIS — Z91011 Allergy to milk products: Secondary | ICD-10-CM | POA: Diagnosis not present

## 2016-10-08 DIAGNOSIS — E079 Disorder of thyroid, unspecified: Secondary | ICD-10-CM | POA: Diagnosis not present

## 2016-10-08 DIAGNOSIS — T8189XA Other complications of procedures, not elsewhere classified, initial encounter: Secondary | ICD-10-CM | POA: Diagnosis not present

## 2016-10-08 DIAGNOSIS — Z881 Allergy status to other antibiotic agents status: Secondary | ICD-10-CM | POA: Diagnosis not present

## 2016-10-08 DIAGNOSIS — Z886 Allergy status to analgesic agent status: Secondary | ICD-10-CM | POA: Diagnosis not present

## 2016-10-08 DIAGNOSIS — Z859 Personal history of malignant neoplasm, unspecified: Secondary | ICD-10-CM | POA: Diagnosis not present

## 2016-10-08 DIAGNOSIS — Z7951 Long term (current) use of inhaled steroids: Secondary | ICD-10-CM | POA: Diagnosis not present

## 2016-10-08 NOTE — Assessment & Plan Note (Signed)
Mild tibiotalar and subtalar joint osteoarthritis on CT. Tibiotalar joint injection as above.  Return in one month.

## 2016-10-08 NOTE — Progress Notes (Signed)
  Subjective:    CC: Follow-up  HPI: Tanya Hogan returns, she has posterior ankle joint pain, she was unable to get an MRI due to implanted device, pain is worst with terminal plantar flexion of the ankle, she also has reproduction of palpable tenderness at the posterior tibiotalar joint. She failed oral analgesics, therapy, orthotics. She is here to consider injection.  Past medical history:  Negative.  See flowsheet/record as well for more information.  Surgical history: Negative.  See flowsheet/record as well for more information.  Family history: Negative.  See flowsheet/record as well for more information.  Social history: Negative.  See flowsheet/record as well for more information.  Allergies, and medications have been entered into the medical record, reviewed, and no changes needed.   Review of Systems: No fevers, chills, night sweats, weight loss, chest pain, or shortness of breath.   Objective:    General: Well Developed, well nourished, and in no acute distress.  Neuro: Alert and oriented x3, extra-ocular muscles intact, sensation grossly intact.  HEENT: Normocephalic, atraumatic, pupils equal round reactive to light, neck supple, no masses, no lymphadenopathy, thyroid nonpalpable.  Skin: Warm and dry, no rashes. Cardiac: Regular rate and rhythm, no murmurs rubs or gallops, no lower extremity edema.  Respiratory: Clear to auscultation bilaterally. Not using accessory muscles, speaking in full sentences.  CT reviewed, shows mild osteoarthritis in the tibiotalar joint, moderate osteoarthritis in most of the subtalar joints.  Procedure: Real-time Ultrasound Guided Injection of left tibiotalar joint Device: GE Logiq E  Verbal informed consent obtained.  Time-out conducted.  Noted no overlying erythema, induration, or other signs of local infection.  Skin prepped in a sterile fashion.  Local anesthesia: Topical Ethyl chloride.  With sterile technique and under real time ultrasound  guidance:  Taking care to avoid the dorsalis pedis artery advanced into the tibiotalar joint from an anterior approach, and injected 1 mL kenalog 40, 1 mL lidocaine, 1 mL Marcaine. Completed without difficulty  Pain immediately resolved suggesting accurate placement of the medication.  Advised to call if fevers/chills, erythema, induration, drainage, or persistent bleeding.  Images permanently stored and available for review in the ultrasound unit.  Impression: Technically successful ultrasound guided injection.  Impression and Recommendations:    Left ankle pain Mild tibiotalar and subtalar joint osteoarthritis on CT. Tibiotalar joint injection as above.  Return in one month.

## 2016-10-09 ENCOUNTER — Telehealth: Payer: Self-pay

## 2016-10-09 DIAGNOSIS — S99912A Unspecified injury of left ankle, initial encounter: Secondary | ICD-10-CM | POA: Diagnosis not present

## 2016-10-09 NOTE — Telephone Encounter (Signed)
Her first mistake was going to the emergency department.  She should understand that emergency providers really have no idea about this.  We went in from the front, there are no major nerves here.  ED provider should know this, however.  Also there's no reason BOTH feet should be swelling, since only one was injection.  The workup came up negative because nothing is wrong with her and if anything there is a steroid flare in ONE ankle but not both.  Give it time, we discussed in the past that injection can create discomfort the same day and next day but often need a few days to really start working.

## 2016-10-09 NOTE — Telephone Encounter (Signed)
Pt reports that she went to [facility] ED last night.  She stated that both her ankles and legs had began to swell and burn. All testing done in the ED came back normal.  MD in the ED thinks that maybe you hit a nerve in her foot. They Rx her oxycodone, zofran, and benadryl.  She also wants you to know that she do trust your judgement and she is ok with you giving her injections in the future.  Redness, swelling, and burning all have subsided. She said that her ankles are just sore. Pt encouraged to go to the nearest ED or UC over the weekend if Sx become worse.  Pt advised to make f/u appointment with DR. T if Sx has not improved by Monday. Per pt request will route to PCP.

## 2016-10-09 NOTE — Telephone Encounter (Signed)
Pt.notified

## 2016-10-15 DIAGNOSIS — J3489 Other specified disorders of nose and nasal sinuses: Secondary | ICD-10-CM | POA: Diagnosis not present

## 2016-10-19 DIAGNOSIS — R0602 Shortness of breath: Secondary | ICD-10-CM | POA: Diagnosis not present

## 2016-10-19 DIAGNOSIS — M549 Dorsalgia, unspecified: Secondary | ICD-10-CM | POA: Diagnosis not present

## 2016-10-19 DIAGNOSIS — I10 Essential (primary) hypertension: Secondary | ICD-10-CM | POA: Diagnosis not present

## 2016-10-19 DIAGNOSIS — S199XXA Unspecified injury of neck, initial encounter: Secondary | ICD-10-CM | POA: Diagnosis not present

## 2016-10-19 DIAGNOSIS — M546 Pain in thoracic spine: Secondary | ICD-10-CM | POA: Diagnosis not present

## 2016-10-19 DIAGNOSIS — M25532 Pain in left wrist: Secondary | ICD-10-CM | POA: Diagnosis not present

## 2016-10-19 DIAGNOSIS — S6992XA Unspecified injury of left wrist, hand and finger(s), initial encounter: Secondary | ICD-10-CM | POA: Diagnosis not present

## 2016-10-19 DIAGNOSIS — S299XXA Unspecified injury of thorax, initial encounter: Secondary | ICD-10-CM | POA: Diagnosis not present

## 2016-10-19 DIAGNOSIS — M1712 Unilateral primary osteoarthritis, left knee: Secondary | ICD-10-CM | POA: Diagnosis not present

## 2016-10-19 DIAGNOSIS — S0990XA Unspecified injury of head, initial encounter: Secondary | ICD-10-CM | POA: Diagnosis not present

## 2016-10-19 DIAGNOSIS — M25562 Pain in left knee: Secondary | ICD-10-CM | POA: Diagnosis not present

## 2016-10-19 DIAGNOSIS — R079 Chest pain, unspecified: Secondary | ICD-10-CM | POA: Diagnosis not present

## 2016-10-22 ENCOUNTER — Ambulatory Visit (INDEPENDENT_AMBULATORY_CARE_PROVIDER_SITE_OTHER): Payer: Medicare Other | Admitting: Sports Medicine

## 2016-10-22 ENCOUNTER — Encounter: Payer: Self-pay | Admitting: Sports Medicine

## 2016-10-22 VITALS — BP 110/67 | HR 67 | Ht 61.0 in | Wt 197.0 lb

## 2016-10-22 DIAGNOSIS — G8929 Other chronic pain: Secondary | ICD-10-CM

## 2016-10-22 DIAGNOSIS — M25572 Pain in left ankle and joints of left foot: Secondary | ICD-10-CM

## 2016-10-22 NOTE — Assessment & Plan Note (Signed)
Did have a steroid flare after tibiotalar injection.  Now is completely pain-free in both of her ankles. Her diagnosis is likely just primary osteoarthritis of the ankle, no tarsal tunnel syndrome.

## 2016-10-22 NOTE — Progress Notes (Signed)
  Subjective:    CC: Follow-up  HPI: Tanya Hogan returns regarding her ankle pain, we injected it, she returns pain-free after a brief steroid flare. Happy with results so far.  Past medical history:  Negative.  See flowsheet/record as well for more information.  Surgical history: Negative.  See flowsheet/record as well for more information.  Family history: Negative.  See flowsheet/record as well for more information.  Social history: Negative.  See flowsheet/record as well for more information.  Allergies, and medications have been entered into the medical record, reviewed, and no changes needed.   Review of Systems: No fevers, chills, night sweats, weight loss, chest pain, or shortness of breath.   Objective:    General: Well Developed, well nourished, and in no acute distress.  Neuro: Alert and oriented x3, extra-ocular muscles intact, sensation grossly intact.  HEENT: Normocephalic, atraumatic, pupils equal round reactive to light, neck supple, no masses, no lymphadenopathy, thyroid nonpalpable.  Skin: Warm and dry, no rashes. Cardiac: Regular rate and rhythm, no murmurs rubs or gallops, no lower extremity edema.  Respiratory: Clear to auscultation bilaterally. Not using accessory muscles, speaking in full sentences. Left Ankle: No visible erythema or swelling. Range of motion is full in all directions. Strength is 5/5 in all directions. Stable lateral and medial ligaments; squeeze test and kleiger test unremarkable; Talar dome nontender; No pain at base of 5th MT; No tenderness over cuboid; No tenderness over N spot or navicular prominence No tenderness on posterior aspects of lateral and medial malleolus No sign of peroneal tendon subluxations; Negative tarsal tunnel tinel's Able to walk 4 steps.  Impression and Recommendations:    Primary osteoarthritis of left ankle Did have a steroid flare after tibiotalar injection.  Now is completely pain-free in both of her  ankles. Her diagnosis is likely just primary osteoarthritis of the ankle, no tarsal tunnel syndrome.

## 2016-10-29 ENCOUNTER — Encounter: Payer: Self-pay | Admitting: Adult Health

## 2016-10-29 ENCOUNTER — Other Ambulatory Visit: Payer: Self-pay | Admitting: Family Medicine

## 2016-10-29 ENCOUNTER — Ambulatory Visit (HOSPITAL_BASED_OUTPATIENT_CLINIC_OR_DEPARTMENT_OTHER): Payer: Medicare Other | Admitting: Adult Health

## 2016-10-29 VITALS — BP 133/63 | HR 70 | Temp 97.8°F | Resp 18 | Ht 61.0 in | Wt 198.3 lb

## 2016-10-29 DIAGNOSIS — J4540 Moderate persistent asthma, uncomplicated: Secondary | ICD-10-CM | POA: Diagnosis not present

## 2016-10-29 DIAGNOSIS — Z853 Personal history of malignant neoplasm of breast: Secondary | ICD-10-CM

## 2016-10-29 DIAGNOSIS — G4721 Circadian rhythm sleep disorder, delayed sleep phase type: Secondary | ICD-10-CM | POA: Diagnosis not present

## 2016-10-29 DIAGNOSIS — Z87891 Personal history of nicotine dependence: Secondary | ICD-10-CM | POA: Diagnosis not present

## 2016-10-29 DIAGNOSIS — R0602 Shortness of breath: Secondary | ICD-10-CM | POA: Diagnosis not present

## 2016-10-29 DIAGNOSIS — M8580 Other specified disorders of bone density and structure, unspecified site: Secondary | ICD-10-CM

## 2016-10-29 DIAGNOSIS — C50212 Malignant neoplasm of upper-inner quadrant of left female breast: Secondary | ICD-10-CM

## 2016-10-29 DIAGNOSIS — G4733 Obstructive sleep apnea (adult) (pediatric): Secondary | ICD-10-CM | POA: Diagnosis not present

## 2016-10-29 DIAGNOSIS — Z170 Estrogen receptor positive status [ER+]: Principal | ICD-10-CM

## 2016-10-29 DIAGNOSIS — Z950 Presence of cardiac pacemaker: Secondary | ICD-10-CM | POA: Diagnosis not present

## 2016-10-29 NOTE — Progress Notes (Signed)
CLINIC:  Survivorship   REASON FOR VISIT:  Routine follow-up for history of breast cancer.   BRIEF ONCOLOGIC HISTORY:    Breast cancer of upper-inner quadrant of left female breast ([place])   04/22/2010 Surgery    Left breast lumpectomy: 0.5 cm invasive ductal carcinoma ER positive PR positive HER-2 negative, T1 N0 M0 stage IA      04/24/2010 - 05/02/2010 Radiation Therapy    Partial breast radiation      07/01/2010 - 07/10/2015 Anti-estrogen oral therapy    Tamoxifen 20 mg daily 5 years        INTERVAL HISTORY:  Tanya Hogan presents to the [facility] today for routine follow-up for her history of breast cancer.  Overall, she reports feeling quite well. She has a lot of chronic issues with her lungs and heart, but otherwise is feeling well.  She is taking calcium.  She does have NASH and is currently in a study about this.  She is ona  56 day GMO free diet as well.  She does have osteopenia based on a bone density from 09/03/16.      REVIEW OF SYSTEMS:  Review of Systems  Constitutional: Negative for chills, fever, malaise/fatigue and weight loss.  HENT: Negative for hearing loss and tinnitus.   Eyes: Negative for blurred vision and double vision.  Respiratory: Negative for cough and hemoptysis.   Cardiovascular: Negative for chest pain, palpitations and leg swelling.  Gastrointestinal: Negative for abdominal pain, blood in stool, constipation, diarrhea, heartburn, melena, nausea and vomiting.  Genitourinary: Negative for dysuria and urgency.  Skin: Negative for rash.  Neurological: Negative for dizziness, tingling and headaches.  Endo/Heme/Allergies: Negative for environmental allergies. Does not bruise/bleed easily.  Psychiatric/Behavioral: Negative for depression. The patient is not nervous/anxious.   Breast: Denies any new nodularity, masses, tenderness, nipple changes, or nipple discharge.   PAST MEDICAL/SURGICAL HISTORY:  Past Medical History:  Diagnosis Date    . Alkaline phosphatase elevation   . Breast cancer ([place]) 2011  . Carpal tunnel syndrome   . CHF (congestive heart failure) ([place])   . Closed fracture of unspecified part of radius (alone)   . DDD (degenerative disc disease), lumbar   . Depression   . Fibromyalgia   . HTN (hypertension)   . Hypothyroidism   . IBS (irritable bowel syndrome)   . LBBB (left bundle branch block)    Past Surgical History:  Procedure Laterality Date  . BREAST LUMPECTOMY    . FOOT SURGERY    . KNEE SURGERY    . PACEMAKER GENERATOR CHANGE N/A 08/23/2014   Procedure: PACEMAKER GENERATOR CHANGE;  Surgeon: Evans Lance, MD;  Location: [facility] CATH LAB;  Service: Cardiovascular;  Laterality: N/A;  . PACEMAKER PLACEMENT  2009   [facility]  . TUBAL LIGATION       ALLERGIES:  Allergies  Allergen Reactions  . Penicillins Anaphylaxis  . Petrolatum-Zinc Oxide Rash  . Accupril [Quinapril Hcl] Other (See Comments)    Hyperkalemia  . Aspirin Other (See Comments)    Pt states "burns holes in stomach"  . Baking Soda-Fluoride [Sodium Fluoride] Itching  . Codeine Nausea And Vomiting  . Fluticasone Other (See Comments)    Nasal ulcer.    . Furosemide Other (See Comments)    Nose bleed  . Iodinated Diagnostic Agents   . Lactose Intolerance (Gi) Other (See Comments)    Increased calcium, sneezing, nasal issues  . Levocetirizine   . Losartan Potassium Itching  . Metformin And  Related Diarrhea  . Nasonex [Mometasone Furoate] Other (See Comments)    nosebleed  . Quinine     unknown  . Rifampin Other (See Comments)    DOES NOT REMEMBER THIS MEDICATION OR ALLERGY  . Clindamycin/Lincomycin Rash  . Miralax [Polyethylene Glycol] Rash  . Sulfa Antibiotics Rash  . Sulfamethoxazole Rash  . Sulfonamide Derivatives Rash  . Zoledronic Acid Other (See Comments) and Rash    shaking     CURRENT MEDICATIONS:  Outpatient Encounter Prescriptions as of 10/29/2016  Medication Sig Note  . ACCU-CHEK FASTCLIX  LANCETS MISC Test twice a day   . ACCU-CHEK SMARTVIEW test strip Test two times daily   . acetaminophen (TYLENOL) 325 MG tablet Take 650 mg by mouth.   . AMBULATORY NON FORMULARY MEDICATION Medication Name: Needs tubing and suplies for her nebulizer machine. Dx COPD. [facility]   . AMBULATORY NON FORMULARY MEDICATION Fastclix lancets Dx: E11.9   . atorvastatin (LIPITOR) 20 MG tablet Take 1 tablet (20 mg total) by mouth daily.   . BD PEN NEEDLE NANO U/F 32G X 4 MM MISC Test two times daily   . Blood Glucose Monitoring Suppl (ACCU-CHEK NANO SMARTVIEW) w/Device KIT Test two times daily   . bumetanide (BUMEX) 1 MG tablet TAKE 1 TABLET BY MOUTH TWO  TIMES DAILY   . BYSTOLIC 10 MG tablet TAKE 1 TABLET BY MOUTH  DAILY   . dicyclomine (BENTYL) 20 MG tablet Take 10-20 mg by mouth every 8 (eight) hours as needed for spasms. Take 1/2-1 tablet by mouth every eights hours as needed for cramps   . diltiazem (CARDIZEM) 120 MG tablet Take 1 tablet (120 mg total) by mouth 2 (two) times daily. (Patient taking differently: Take 120 mg by mouth daily. )   . DULoxetine (CYMBALTA) 60 MG capsule Take 2 capsules (120 mg total) by mouth daily. (Patient taking differently: Take 60 mg by mouth daily. )   . Fluticasone-Salmeterol (ADVAIR) 250-50 MCG/DOSE AEPB Frequency:two times daily   Dosage:250-50   MCG/DOSE  Instructions:Advair Diskus (250-50MCG/DOSE MISC, 2  puff  Inhalation two times daily)  Note:   . ipratropium (ATROVENT) 0.06 % nasal spray USE 2 SPRAYS IN EACH NOSTRIL EVERY 4 HOURS AS NEEDED FOR RHINITIS   . LANTUS SOLOSTAR 100 UNIT/ML Solostar Pen Inject subcutaneously 10  units daily at 10pm   . levothyroxine (SYNTHROID, LEVOTHROID) 75 MCG tablet Take 1 tablet (75 mcg total) by mouth daily.   . pantoprazole (PROTONIX) 40 MG tablet TAKE 1 TABLET BY MOUTH  EVERY MORNING AS DIRECTED   . SPIRIVA HANDIHALER 18 MCG inhalation capsule Inhale the contents of 1  capsule via HandiHaler  daily   . EPIPEN 2-PAK 0.3  MG/0.3ML SOAJ injection INJECT 1 PEN INTO MUSCLE OF LATERAL THIGH FOR SEVERE ALLERGIC REACTION. CALL 911 AFTER USING THIS MEDICATION. 01/15/2016: Received from: External Pharmacy  . PROAIR HFA 108 (90 Base) MCG/ACT inhaler USE 2 PUFFS EVERY 4 HOURS  AS NEEDED (Patient not taking: Reported on 10/29/2016)   . [DISCONTINUED] magnesium oxide (MAG-OX) 400 MG tablet Take 2 tablets (800 mg total) by mouth at bedtime.    No facility-administered encounter medications on file as of 10/29/2016.      ONCOLOGIC FAMILY HISTORY:  Family History  Problem Relation Age of Onset  . Heart failure    . Breast cancer    . Hypertension    . Melanoma Mother     SOCIAL HISTORY:  Tanya Hogan is single and lives alone  in [place], [place].  Tanya Hogan is currently retired.  She denies any current or history of tobacco, alcohol, or illicit drug use.     PHYSICAL EXAMINATION:  Vital Signs: Vitals:   10/29/16 1309  BP: 133/63  Pulse: 70  Resp: 18  Temp: 97.8 F (36.6 C)   Filed Weights   10/29/16 1309  Weight: 198 lb 4.8 oz (89.9 kg)   General: Well-nourished, well-appearing female in no acute distress.  Unaccompanied today.   HEENT: Head is normocephalic.  Pupils equal and reactive to light. Conjunctivae clear without exudate.  Sclerae anicteric. Oral mucosa is pink, moist.  Oropharynx is pink without lesions or erythema.  Lymph: No cervical, supraclavicular, or infraclavicular lymphadenopathy noted on palpation.  Cardiovascular: Regular rate and rhythm.[name] Respiratory: Clear to auscultation bilaterally. Chest expansion symmetric; breathing non-labored.  Breast Exam:  -Left breast: No appreciable masses on palpation. No skin redness, thickening, or peau d'orange appearance; no nipple retraction or nipple discharge; mild distortion in symmetry at previous lumpectomy site well healed scar without erythema or nodularity.  -Right breast: No appreciable masses on palpation. No skin redness,  thickening, or peau d'orange appearance; no nipple retraction or nipple discharge;  -Axilla: No axillary adenopathy bilaterally.  GI: Abdomen soft and round; non-tender, non-distended. Bowel sounds normoactive. No hepatosplenomegaly.   GU: Deferred.  Neuro: No focal deficits. Steady gait.  Psych: Mood and affect normal and appropriate for situation.  Extremities: No edema. Skin: Warm and dry.  LABORATORY DATA:  None for this visit   DIAGNOSTIC IMAGING:  Most recent mammogram:   Most recent bone density:    ASSESSMENT AND PLAN:  Ms.. Hogan is a pleasant 66 y.o. female with history of Stage IA left breast invasive ductal carcinoma, ER+/PR+/HER2-, diagnosed in 2011, treated with lumpectomy, adjuvant radiation therapy, and anti-estrogen therapy with 5 years of Tamoxifen.  She [facility] to [facility] for surveillance and routine follow-up.   1. History of breast cancer:  Tanya Hogan is currently clinically and radiographically without evidence of disease or recurrence of breast cancer. She will be due for mammogram in January, 2019; orders placed today.   I encouraged her to call me with any questions or concerns before her next visit at the cancer center, and I would be happy to see her sooner, if needed.    2. Bone health:  Given Tanya Hogan's age, history of breast cancer, and her current diagnosis of osteopenia, she is at risk for bone demineralization. Her last DEXA scan was on 09/03/2016.  She was encouraged to increase her consumption of foods rich in calcium, as well as increase her weight-bearing activities.  She was given education on specific food and activities to promote bone health. Her PCP is following her bone health.  3. Cancer screening:  Due to Tanya Hogan's history and her age, she should receive screening for skin cancers, colon cancer, and gynecologic cancers. She was encouraged to follow-up with her PCP for appropriate cancer screenings.   4. Health maintenance  and wellness promotion: Tanya Hogan was encouraged to consume 5-7 servings of fruits and vegetables per day. She was also encouraged to engage in moderate to vigorous exercise for 30 minutes per day most days of the week. She was instructed to limit her alcohol consumption and continue to abstain from tobacco use.    Dispo:  -Return to cancer center in one year for LTS follow up -mammogram ordered for 08/2017   A total of (30) minutes of face-to-face  time was spent with this patient with greater than 50% of that time in counseling and care-coordination.   Gardenia Phlegm, NP Survivorship Program [facility], [place] [phone]   Note: PRIMARY CARE PROVIDER Beatrice Lecher, [place] [phone]

## 2016-11-02 ENCOUNTER — Encounter: Payer: Medicare Other | Admitting: Adult Health

## 2016-11-04 ENCOUNTER — Encounter: Payer: Medicare Other | Admitting: Nurse Practitioner

## 2016-11-09 DIAGNOSIS — Z006 Encounter for examination for normal comparison and control in clinical research program: Secondary | ICD-10-CM | POA: Diagnosis not present

## 2016-11-09 DIAGNOSIS — K7581 Nonalcoholic steatohepatitis (NASH): Secondary | ICD-10-CM | POA: Diagnosis not present

## 2016-11-10 DIAGNOSIS — J3089 Other allergic rhinitis: Secondary | ICD-10-CM | POA: Diagnosis not present

## 2016-11-10 DIAGNOSIS — J301 Allergic rhinitis due to pollen: Secondary | ICD-10-CM | POA: Diagnosis not present

## 2016-11-10 DIAGNOSIS — L509 Urticaria, unspecified: Secondary | ICD-10-CM | POA: Diagnosis not present

## 2016-11-10 DIAGNOSIS — J3081 Allergic rhinitis due to animal (cat) (dog) hair and dander: Secondary | ICD-10-CM | POA: Diagnosis not present

## 2016-11-10 DIAGNOSIS — J4540 Moderate persistent asthma, uncomplicated: Secondary | ICD-10-CM | POA: Diagnosis not present

## 2016-11-18 ENCOUNTER — Encounter: Payer: Self-pay | Admitting: Family Medicine

## 2016-11-18 ENCOUNTER — Ambulatory Visit (INDEPENDENT_AMBULATORY_CARE_PROVIDER_SITE_OTHER): Payer: Medicare Other | Admitting: Family Medicine

## 2016-11-18 VITALS — BP 124/61 | HR 80 | Ht 61.0 in | Wt 202.0 lb

## 2016-11-18 DIAGNOSIS — E118 Type 2 diabetes mellitus with unspecified complications: Secondary | ICD-10-CM | POA: Diagnosis not present

## 2016-11-18 DIAGNOSIS — J4540 Moderate persistent asthma, uncomplicated: Secondary | ICD-10-CM | POA: Diagnosis not present

## 2016-11-18 DIAGNOSIS — K7581 Nonalcoholic steatohepatitis (NASH): Secondary | ICD-10-CM

## 2016-11-18 DIAGNOSIS — I10 Essential (primary) hypertension: Secondary | ICD-10-CM

## 2016-11-18 LAB — POCT GLYCOSYLATED HEMOGLOBIN (HGB A1C): Hemoglobin A1C: 7

## 2016-11-18 MED ORDER — DOXYCYCLINE HYCLATE 100 MG PO TABS: 100.0000 mg | ORAL_TABLET | Freq: Two times a day (BID) | ORAL | 0 refills | Status: DC

## 2016-11-18 MED ORDER — INSULIN GLARGINE 100 UNIT/ML SOLOSTAR PEN: PEN_INJECTOR | SUBCUTANEOUS | 0 refills | Status: DC

## 2016-11-18 NOTE — Progress Notes (Signed)
Subjective:    CC: DM  HPI: Diabetes - no hypoglycemic events. No wounds or sores that are not healing well. No increased thirst or urination. Checking glucose at home. Taking medications as prescribed without any side effects.  Hypertension- Pt denies chest pain, SOB, dizziness, or heart palpitations.  Taking meds as directed w/o problems.  Denies medication side effects.    Mod persistant Asthma -  She follows with pulmonology and saw her pulmonologist at [facility] about 2 weeks ago. They did not change her regimen and have encouraged her to continue with her Advair and Spiriva combination and to avoid overuse of her albuterol.  NASH - she just had repeat ultrasound on 3/19 of this year. She is currently enrolled in a research study of obetacholic acid versus placebo. She admits she hasn't been eating the best and hasn't been very physically active.  Still feels like has a sinus infection about 4 weeks ago. Says she got mostly better on the doxy but still having some thick drianage.  But not fever and better than she was. No allergic sxs.   Past medical history, Surgical history, Family history not pertinant except as noted below, Social history, Allergies, and medications have been entered into the medical record, reviewed, and corrections made.   Review of Systems: No fevers, chills, night sweats, weight loss, chest pain, or shortness of breath.   Objective:    General: Well Developed, well nourished, and in no acute distress.  Neuro: Alert and oriented x3, extra-ocular muscles intact, sensation grossly intact.  HEENT: Normocephalic, atraumatic  Skin: Warm and dry, no rashes. Cardiac: Regular rate and rhythm, no murmurs rubs or gallops, no lower extremity edema.  Respiratory: Clear to auscultation bilaterally. Not using accessory muscles, speaking in full sentences.   Impression and Recommendations:   HTN - Well controlled. Continue current regimen. Follow up in  3-4 months.    DM- Well controlledBut A1c up from previous. Increase lantus to 20 units. . Follow up in  3 mo. again encouraged to work on diet and exercise.  Asthma - NASH following with GI.  Says she is trying to work on her diet and trying to increase her activity level. Discuss how can progress to cirrhosis over time.   Acute sinusitis - will refill the doxy.  If not better in one week and please give [place] a call back.  NASH - continue to follow with gastroenterology. Ultrasound up-to-date. Stressed once again that the most important treatment is weight loss and healthy diet and regular exercise.

## 2016-11-18 NOTE — Patient Instructions (Signed)
Go up to 20 units on your Lantus.

## 2016-12-02 DIAGNOSIS — G4733 Obstructive sleep apnea (adult) (pediatric): Secondary | ICD-10-CM | POA: Diagnosis not present

## 2016-12-30 ENCOUNTER — Telehealth: Payer: Self-pay | Admitting: Cardiology

## 2016-12-30 ENCOUNTER — Ambulatory Visit (INDEPENDENT_AMBULATORY_CARE_PROVIDER_SITE_OTHER): Payer: Medicare Other | Admitting: *Deleted

## 2016-12-30 DIAGNOSIS — I442 Atrioventricular block, complete: Secondary | ICD-10-CM

## 2016-12-30 NOTE — Telephone Encounter (Signed)
Spoke with pt and reminded pt of remote transmission that is due today. Pt verbalized understanding.   

## 2016-12-30 NOTE — Progress Notes (Signed)
Remote pacemaker transmission.   

## 2017-01-01 ENCOUNTER — Encounter: Payer: Self-pay | Admitting: Cardiology

## 2017-01-01 LAB — CUP PACEART REMOTE DEVICE CHECK
Battery Remaining Longevity: 53 mo
Battery Voltage: 3 V
Brady Statistic AP VP Percent: 58.75 %
Brady Statistic AP VS Percent: 0.23 %
Brady Statistic AS VP Percent: 39.42 %
Brady Statistic RA Percent Paced: 56.74 %
Brady Statistic RV Percent Paced: 96.34 %
Implantable Lead Implant Date: 20030324
Implantable Lead Model: 4068
Implantable Lead Model: 4068
Implantable Lead Model: 4193
Implantable Pulse Generator Implant Date: 20151231
Lead Channel Impedance Value: 1292 Ohm
Lead Channel Impedance Value: 4047 Ohm
Lead Channel Impedance Value: 4047 Ohm
Lead Channel Impedance Value: 418 Ohm
Lead Channel Impedance Value: 418 Ohm
Lead Channel Pacing Threshold Amplitude: 0.75 V
Lead Channel Pacing Threshold Amplitude: 1.875 V
Lead Channel Pacing Threshold Pulse Width: 0.4 ms
Lead Channel Pacing Threshold Pulse Width: 0.4 ms
Lead Channel Sensing Intrinsic Amplitude: 1.5 mV
Lead Channel Sensing Intrinsic Amplitude: 5.375 mV
Lead Channel Setting Pacing Amplitude: 1.5 V
Lead Channel Setting Pacing Amplitude: 2.25 V
Lead Channel Setting Pacing Pulse Width: 0.4 ms
Lead Channel Setting Pacing Pulse Width: 0.8 ms
Lead Channel Setting Sensing Sensitivity: 4 mV
MDC IDC LEAD IMPLANT DT: 20030324
MDC IDC LEAD IMPLANT DT: 20030324
MDC IDC LEAD LOCATION: 753858
MDC IDC LEAD LOCATION: 753859
MDC IDC LEAD LOCATION: 753860
MDC IDC MSMT LEADCHNL LV IMPEDANCE VALUE: 1121 Ohm
MDC IDC MSMT LEADCHNL LV IMPEDANCE VALUE: 4047 Ohm
MDC IDC MSMT LEADCHNL RA IMPEDANCE VALUE: 380 Ohm
MDC IDC MSMT LEADCHNL RA SENSING INTR AMPL: 1.5 mV
MDC IDC MSMT LEADCHNL RV IMPEDANCE VALUE: 570 Ohm
MDC IDC MSMT LEADCHNL RV SENSING INTR AMPL: 5.375 mV
MDC IDC SESS DTM: 20180509164835
MDC IDC SET LEADCHNL RV PACING AMPLITUDE: 4 V
MDC IDC STAT BRADY AS VS PERCENT: 1.6 %

## 2017-01-05 DIAGNOSIS — G4733 Obstructive sleep apnea (adult) (pediatric): Secondary | ICD-10-CM | POA: Diagnosis not present

## 2017-01-27 ENCOUNTER — Ambulatory Visit (INDEPENDENT_AMBULATORY_CARE_PROVIDER_SITE_OTHER): Payer: Medicare Other | Admitting: Family Medicine

## 2017-01-27 ENCOUNTER — Encounter: Payer: Self-pay | Admitting: Family Medicine

## 2017-01-27 VITALS — BP 103/65 | HR 90 | Ht 61.81 in | Wt 210.0 lb

## 2017-01-27 DIAGNOSIS — R682 Dry mouth, unspecified: Secondary | ICD-10-CM

## 2017-01-27 DIAGNOSIS — K5909 Other constipation: Secondary | ICD-10-CM

## 2017-01-27 MED ORDER — DULOXETINE HCL 60 MG PO CPEP: 60.0000 mg | ORAL_CAPSULE | Freq: Every day | ORAL | 0 refills | Status: DC

## 2017-01-27 NOTE — Patient Instructions (Addendum)
Commend magnesium citrate. Total of 80 ounces. Drank 40 ounces and then 6 hours later drink the other 40 ounces. May need to consider using an enema. Call if no relief after Monday.

## 2017-01-27 NOTE — Progress Notes (Signed)
Subjective:    Patient ID: Tanya Hogan, female    DOB: [birth year], 66 y.o.   MRN: [id number]  HPI 66 year old female comes in today complaining of constipation.  She's had problems on and off with this previously. She has tried Senokot which doesn't seem to work. She is using milk of magnesia area she's tried magnesium citrate. Has also tried a glycerin suppository.  She is very gassy as well.  She has been taking Align probiotic but that hasn't helped either.  She denies any abdominal pain but has had some mild nausea. She says that years ago partly round 1976 she had to have rectal surgery because of some damage to the rectal ring.  The increase on the Cymbalta has worsened her dry mouth.  She recently had some dental work and she's had some challenges because of the excess dry mouth.  Review of Systems   BP 103/65   Pulse 90   Ht 5' 1.81" (1.57 m)   Wt 210 lb (95.3 kg)   SpO2 94%   BMI 38.64 kg/m     Allergies  Allergen Reactions  . Penicillins Anaphylaxis  . Petrolatum-Zinc Oxide Rash  . Accupril [Quinapril Hcl] Other (See Comments)    Hyperkalemia  . Aspirin Other (See Comments)    Pt states "burns holes in stomach"  . Baking Soda-Fluoride [Sodium Fluoride] Itching  . Codeine Nausea And Vomiting  . Fluticasone Other (See Comments)    Nasal ulcer.    . Furosemide Other (See Comments)    Nose bleed  . Iodinated Diagnostic Agents   . Lactose Intolerance (Gi) Other (See Comments)    Increased calcium, sneezing, nasal issues  . Levocetirizine   . Losartan Potassium Itching  . Metformin And Related Diarrhea  . Nasonex [Mometasone Furoate] Other (See Comments)    nosebleed  . Quinine     unknown  . Rifampin Other (See Comments)    DOES NOT REMEMBER THIS MEDICATION OR ALLERGY  . Clindamycin/Lincomycin Rash  . Miralax [Polyethylene Glycol] Rash  . Sulfa Antibiotics Rash  . Sulfamethoxazole Rash  . Sulfonamide Derivatives Rash  . Zoledronic Acid Other (See Comments)  and Rash    shaking    Past Medical History:  Diagnosis Date  . Alkaline phosphatase elevation   . Breast cancer ([place]) 2011  . Carpal tunnel syndrome   . CHF (congestive heart failure) ([place])   . Closed fracture of unspecified part of radius (alone)   . DDD (degenerative disc disease), lumbar   . Depression   . Fibromyalgia   . HTN (hypertension)   . Hypothyroidism   . IBS (irritable bowel syndrome)   . LBBB (left bundle branch block)     Past Surgical History:  Procedure Laterality Date  . BREAST LUMPECTOMY    . FOOT SURGERY    . KNEE SURGERY    . PACEMAKER GENERATOR CHANGE N/A 08/23/2014   Procedure: PACEMAKER GENERATOR CHANGE;  Surgeon: Evans Lance, MD;  Location: [facility] CATH LAB;  Service: Cardiovascular;  Laterality: N/A;  . PACEMAKER PLACEMENT  2009   [facility]  . RECTAL SURGERY  1976  . TUBAL LIGATION      Social History   Social History  . Marital status: Divorced    Spouse name: N/A  . Number of children: N/A  . Years of education: N/A   Occupational History  . Not on file.   Social History Main Topics  . Smoking status: Former Audiological scientist  date: 08/24/1966  . Smokeless tobacco: Never Used  . Alcohol use No  . Drug use: No  . Sexual activity: Not Currently   Other Topics Concern  . Not on file   Social History Narrative  . No narrative on file    Family History  Problem Relation Age of Onset  . Melanoma Mother   . Heart failure Unknown   . Breast cancer Unknown   . Hypertension Unknown     Outpatient Encounter Prescriptions as of 01/27/2017  Medication Sig  . ACCU-CHEK FASTCLIX LANCETS MISC Test twice a day  . ACCU-CHEK SMARTVIEW test strip Test two times daily  . acetaminophen (TYLENOL) 325 MG tablet Take 650 mg by mouth.  . AMBULATORY NON FORMULARY MEDICATION Medication Name: Needs tubing and suplies for her nebulizer machine. Dx COPD. [facility]  . AMBULATORY NON FORMULARY MEDICATION Fastclix lancets Dx: E11.9  .  atorvastatin (LIPITOR) 20 MG tablet Take 1 tablet (20 mg total) by mouth daily.  . BD PEN NEEDLE NANO U/F 32G X 4 MM MISC Test two times daily  . Blood Glucose Monitoring Suppl (ACCU-CHEK NANO SMARTVIEW) w/Device KIT Test two times daily  . bumetanide (BUMEX) 1 MG tablet TAKE 1 TABLET BY MOUTH TWO  TIMES DAILY  . BYSTOLIC 10 MG tablet TAKE 1 TABLET BY MOUTH  DAILY  . dicyclomine (BENTYL) 20 MG tablet Take 10-20 mg by mouth every 8 (eight) hours as needed for spasms. Take 1/2-1 tablet by mouth every eights hours as needed for cramps  . diltiazem (CARDIZEM) 120 MG tablet Take 1 tablet (120 mg total) by mouth 2 (two) times daily. (Patient taking differently: Take 120 mg by mouth daily. )  . DULoxetine (CYMBALTA) 60 MG capsule Take 1 capsule (60 mg total) by mouth daily.  [name] EPIPEN 2-PAK 0.3 MG/0.3ML SOAJ injection INJECT 1 PEN INTO MUSCLE OF LATERAL THIGH FOR SEVERE ALLERGIC REACTION. CALL 911 AFTER USING THIS MEDICATION.  [name] Fluticasone-Salmeterol (ADVAIR) 250-50 MCG/DOSE AEPB Frequency:two times daily   Dosage:250-50   MCG/DOSE  Instructions:Advair Diskus (250-50MCG/DOSE MISC, 2  puff  Inhalation two times daily)  Note:  . Insulin Glargine (LANTUS SOLOSTAR) 100 UNIT/ML Solostar Pen Inject subcutaneously 20  units daily at 10pm  . ipratropium (ATROVENT) 0.06 % nasal spray USE 2 SPRAYS IN EACH NOSTRIL EVERY 4 HOURS AS NEEDED FOR RHINITIS  . levothyroxine (SYNTHROID, LEVOTHROID) 75 MCG tablet TAKE 1 TABLET BY MOUTH  DAILY  . pantoprazole (PROTONIX) 40 MG tablet TAKE 1 TABLET BY MOUTH  EVERY MORNING AS DIRECTED  . PROAIR HFA 108 (90 Base) MCG/ACT inhaler USE 2 PUFFS EVERY 4 HOURS  AS NEEDED  . SPIRIVA HANDIHALER 18 MCG inhalation capsule Inhale the contents of 1  capsule via HandiHaler  daily  . [DISCONTINUED] DULoxetine (CYMBALTA) 60 MG capsule Take 2 capsules (120 mg total) by mouth daily. (Patient taking differently: Take 60 mg by mouth daily. )  . [DISCONTINUED] doxycycline (VIBRA-TABS) 100 MG tablet  Take 1 tablet (100 mg total) by mouth 2 (two) times daily.   No facility-administered encounter medications on file as of 01/27/2017.           Objective:   Physical Exam  Constitutional: She is oriented to person, place, and time. She appears well-developed and well-nourished.  HENT:  Head: Normocephalic.  Abdominal: Soft. Bowel sounds are normal. She exhibits distension. She exhibits no mass. There is no tenderness. There is no rebound and no guarding.  Neurological: She is alert and oriented to person,  place, and time.  Skin: Skin is warm.  Psychiatric: She has a normal mood and affect. Her behavior is normal.        Assessment & Plan:   Chronic constipation-I do think the Cymbalta could certainly be contributing. She actually went ahead and decrease her dose on Sunday back down to 60 mg. I'm hoping over the next week that this will actually help with the dry mouth and the constipation. All of 6. that she may need to do an enema as well. Cymbalta at high doses can cause constipation in the next 10%.  Dry mouth-hopefully decrease the Cymbalta will help.

## 2017-02-08 ENCOUNTER — Telehealth: Payer: Self-pay | Admitting: Internal Medicine

## 2017-02-08 NOTE — Telephone Encounter (Signed)
I spoke with Tanya Hogan. She reports Cardizem was increased at last office visit with Dr. Lovena Le to twice daily.  Since that time she has fallen twice (last fall was about 2 weeks ago).  Reports she also has dizziness, ankle swelling , nausea, vomiting and trembling.  She read side effects of Cardizem and felt like her symptoms made be related to this medication so about 5 days ago she decreased dose to once daily. Since decreasing dose the trembling and dizziness have improved but she still feels jittery.  She does not check her BP at home.  I told Tanya Hogan I would forward message to Dr. Lovena Le for review/recommendations.

## 2017-02-08 NOTE — Telephone Encounter (Signed)
New message     Pt c/o medication issue:  1. Name of Medication:   diltiazem (CARDIZEM) 120 MG tablet Take 1 tablet (120 mg total) by mouth 2 (two) times daily. Patient taking differently: Take 120 mg by mouth daily.     2. How are you currently taking this medication (dosage and times per day)?  Only taking 1 a day,   3. Are you having a reaction (difficulty breathing--STAT)?  Also having shortness of breath , when it was doubled  4. What is your medication issue? Its making her very dizzy and shaking , she has fallen

## 2017-02-11 ENCOUNTER — Other Ambulatory Visit: Payer: Self-pay | Admitting: Family Medicine

## 2017-02-14 NOTE — Telephone Encounter (Signed)
Ask her to stop twice a day and go to once a day. Elliot Cousin

## 2017-02-15 ENCOUNTER — Encounter: Payer: Self-pay | Admitting: Internal Medicine

## 2017-02-15 ENCOUNTER — Encounter (INDEPENDENT_AMBULATORY_CARE_PROVIDER_SITE_OTHER): Payer: Self-pay

## 2017-02-15 ENCOUNTER — Ambulatory Visit (INDEPENDENT_AMBULATORY_CARE_PROVIDER_SITE_OTHER): Payer: Medicare Other | Admitting: Internal Medicine

## 2017-02-15 VITALS — BP 120/70 | HR 76 | Ht 61.0 in | Wt 209.8 lb

## 2017-02-15 DIAGNOSIS — I5022 Chronic systolic (congestive) heart failure: Secondary | ICD-10-CM

## 2017-02-15 DIAGNOSIS — I442 Atrioventricular block, complete: Secondary | ICD-10-CM | POA: Diagnosis not present

## 2017-02-15 DIAGNOSIS — Z950 Presence of cardiac pacemaker: Secondary | ICD-10-CM

## 2017-02-15 LAB — CUP PACEART INCLINIC DEVICE CHECK
Battery Remaining Longevity: 53 mo
Battery Voltage: 3 V
Brady Statistic AS VP Percent: 42.53 %
Brady Statistic RA Percent Paced: 53.68 %
Brady Statistic RV Percent Paced: 96.41 %
Date Time Interrogation Session: 20180625173712
Implantable Lead Implant Date: 20030324
Implantable Lead Location: 753858
Implantable Lead Location: 753860
Implantable Lead Model: 4068
Implantable Lead Model: 4193
Lead Channel Impedance Value: 1178 Ohm
Lead Channel Impedance Value: 4047 Ohm
Lead Channel Impedance Value: 4047 Ohm
Lead Channel Impedance Value: 4047 Ohm
Lead Channel Impedance Value: 418 Ohm
Lead Channel Impedance Value: 418 Ohm
Lead Channel Pacing Threshold Pulse Width: 0.4 ms
Lead Channel Sensing Intrinsic Amplitude: 2 mV
Lead Channel Setting Pacing Amplitude: 2.5 V
Lead Channel Setting Sensing Sensitivity: 4 mV
MDC IDC LEAD IMPLANT DT: 20030324
MDC IDC LEAD IMPLANT DT: 20030324
MDC IDC LEAD LOCATION: 753859
MDC IDC MSMT LEADCHNL LV IMPEDANCE VALUE: 1007 Ohm
MDC IDC MSMT LEADCHNL LV PACING THRESHOLD AMPLITUDE: 1.75 V
MDC IDC MSMT LEADCHNL LV PACING THRESHOLD PULSEWIDTH: 0.8 ms
MDC IDC MSMT LEADCHNL RA IMPEDANCE VALUE: 380 Ohm
MDC IDC MSMT LEADCHNL RA PACING THRESHOLD AMPLITUDE: 0.75 V
MDC IDC MSMT LEADCHNL RA PACING THRESHOLD PULSEWIDTH: 0.4 ms
MDC IDC MSMT LEADCHNL RV IMPEDANCE VALUE: 551 Ohm
MDC IDC MSMT LEADCHNL RV PACING THRESHOLD AMPLITUDE: 1.75 V
MDC IDC PG IMPLANT DT: 20151231
MDC IDC SET LEADCHNL LV PACING PULSEWIDTH: 0.8 ms
MDC IDC SET LEADCHNL RA PACING AMPLITUDE: 1.5 V
MDC IDC SET LEADCHNL RV PACING AMPLITUDE: 3.5 V
MDC IDC SET LEADCHNL RV PACING PULSEWIDTH: 0.8 ms
MDC IDC STAT BRADY AP VP PERCENT: 55.35 %
MDC IDC STAT BRADY AP VS PERCENT: 0.3 %
MDC IDC STAT BRADY AS VS PERCENT: 1.82 %

## 2017-02-15 NOTE — Patient Instructions (Addendum)
Medication Instructions:  Your physician has recommended you make the following change in your medication:  Bumex take 2 tablets in the morning and 1 tablet in the evening for 3 days, then return to normal dose  Labwork: None Ordered   Testing/Procedures: None Ordered   Follow-Up: Your physician wants you to follow-up in: 6 months with Dr. Lovena Le. You will receive a reminder letter in the mail two months in advance. If you don't receive a letter, please call our office to schedule the follow-up appointment.  Remote monitoring is used to monitor your Pacemaker from home. This monitoring reduces the number of office visits required to check your device to one time per year. It allows [place] to keep an eye on the functioning of your device to ensure it is working properly. You are scheduled for a device check from home on 05/17/17  . You may send your transmission at any time that day. If you have a wireless device, the transmission will be sent automatically. After your physician reviews your transmission, you will receive a postcard with your next transmission date.    Any Other Special Instructions Will Be Listed Below (If Applicable).     If you need a refill on your cardiac medications before your next appointment, please call your pharmacy.

## 2017-02-15 NOTE — Progress Notes (Signed)
HPI Mrs. Harries returns today for followup. She is pleasant 66 year old woman with a history of obesity, hypertension, sleep apnea, chronic systolic heart failure, status post biventricular pacemaker insertion. In the interim, the patient has had a chronic cough but her main complaint involves pain in her feet and in her shoulders. She has longstanding fibromyalgia. She remains unable to eat.  Allergies  Allergen Reactions  . Penicillins Anaphylaxis  . Petrolatum-Zinc Oxide Rash  . Accupril [Quinapril Hcl] Other (See Comments)    Hyperkalemia  . Aspirin Other (See Comments)    Pt states "burns holes in stomach"  . Baking Soda-Fluoride [Sodium Fluoride] Itching  . Codeine Nausea And Vomiting  . Fluticasone Other (See Comments)    Nasal ulcer.    . Furosemide Other (See Comments)    Nose bleed  . Lactose Intolerance (Gi) Other (See Comments)    Increased calcium, sneezing, nasal issues  . Levocetirizine     Doesn't recall  . Losartan Potassium Itching  . Metformin And Related Diarrhea  . Nasonex [Mometasone Furoate] Other (See Comments)    nosebleed  . Quinine     unknown  . Rifampin Other (See Comments)    DOES NOT REMEMBER THIS MEDICATION OR ALLERGY  . Clindamycin/Lincomycin Rash  . Iodinated Diagnostic Agents Itching and Rash    Pt states she has had ultrasound with dye but was fine  . Miralax [Polyethylene Glycol] Rash  . Sulfa Antibiotics Rash  . Sulfamethoxazole Rash  . Sulfonamide Derivatives Rash  . Zoledronic Acid Other (See Comments) and Rash    shaking     Current Outpatient Prescriptions  Medication Sig Dispense Refill  . ACCU-CHEK FASTCLIX LANCETS MISC Test twice a day 204 each 11  . ACCU-CHEK SMARTVIEW test strip Test two times daily 200 each 11  . acetaminophen (TYLENOL) 325 MG tablet Take 650 mg by mouth every 6 (six) hours as needed (pain).     [name] albuterol (PROVENTIL HFA;VENTOLIN HFA) 108 (90 Base) MCG/ACT inhaler Inhale 2 puffs into the lungs every 4  (four) hours as needed for wheezing or shortness of breath.    . AMBULATORY NON FORMULARY MEDICATION Medication Name: Needs tubing and suplies for her nebulizer machine. Dx COPD. [facility] 1 vial 0  . AMBULATORY NON FORMULARY MEDICATION Fastclix lancets Dx: E11.9 300 each 3  . atorvastatin (LIPITOR) 20 MG tablet Take 1 tablet (20 mg total) by mouth daily. 90 tablet 3  . BD PEN NEEDLE NANO U/F 32G X 4 MM MISC Test two times daily 180 each 11  . Blood Glucose Monitoring Suppl (ACCU-CHEK NANO SMARTVIEW) w/Device KIT Test two times daily 1 kit 0  . bumetanide (BUMEX) 1 MG tablet TAKE 1 TABLET BY MOUTH TWO  TIMES DAILY 180 tablet 1  . BYSTOLIC 10 MG tablet TAKE 1 TABLET BY MOUTH  DAILY 90 tablet 1  . dicyclomine (BENTYL) 20 MG tablet Take 10-20 mg by mouth every 8 (eight) hours as needed for spasms. Take 1/2-1 tablet by mouth every eights hours as needed for cramps  11  . diltiazem (CARDIZEM) 120 MG tablet Take 120 mg by mouth daily.    [name] EPIPEN 2-PAK 0.3 MG/0.3ML SOAJ injection INJECT 1 PEN INTO MUSCLE OF LATERAL THIGH FOR SEVERE ALLERGIC REACTION. CALL 911 AFTER USING THIS MEDICATION.  2  . Fluticasone-Salmeterol (ADVAIR) 250-50 MCG/DOSE AEPB Frequency:two times daily   Dosage:250-50   MCG/DOSE  Instructions:Advair Diskus (250-50MCG/DOSE MISC, 2  puff  Inhalation two times daily)  Note:    .  insulin glargine (LANTUS) 100 UNIT/ML injection Inject 25 Units into the skin at bedtime.    [name] levothyroxine (SYNTHROID, LEVOTHROID) 75 MCG tablet TAKE 1 TABLET BY MOUTH  DAILY 90 tablet 1  . pantoprazole (PROTONIX) 40 MG tablet TAKE 1 TABLET BY MOUTH  EVERY MORNING AS DIRECTED    . SPIRIVA HANDIHALER 18 MCG inhalation capsule Inhale the contents of 1  capsule via HandiHaler  daily 30 capsule 0   No current facility-administered medications for this visit.      Past Medical History:  Diagnosis Date  . Alkaline phosphatase elevation   . Breast cancer ([place]) 2011  . Carpal tunnel syndrome   . CHF  (congestive heart failure) ([place])   . Closed fracture of unspecified part of radius (alone)   . DDD (degenerative disc disease), lumbar   . Depression   . Fibromyalgia   . HTN (hypertension)   . Hypothyroidism   . IBS (irritable bowel syndrome)   . LBBB (left bundle branch block)     ROS:   All systems reviewed and negative except as noted in the HPI.   Past Surgical History:  Procedure Laterality Date  . BREAST LUMPECTOMY    . FOOT SURGERY    . KNEE SURGERY    . PACEMAKER GENERATOR CHANGE N/A 08/23/2014   Procedure: PACEMAKER GENERATOR CHANGE;  Surgeon: Evans Lance, MD;  [facility];  Service: Cardiovascular;  Laterality: N/A;  . PACEMAKER PLACEMENT  2009   [place] [facility]  . RECTAL SURGERY  1976  . TUBAL LIGATION       Family History  Problem Relation Age of Onset  . Melanoma Mother   . Heart failure Unknown   . Breast cancer Unknown   . Hypertension Unknown      Social History   Social History  . Marital status: Divorced    Spouse name: N/A  . Number of children: N/A  . Years of education: N/A   Occupational History  . Not on file.   Social History Main Topics  . Smoking status: Former Smoker    Quit date: 08/24/1966  . Smokeless tobacco: Never Used  . Alcohol use No  . Drug use: No  . Sexual activity: Not Currently   Other Topics Concern  . Not on file   Social History Narrative  . No narrative on file     BP 120/70   Pulse 76   Ht 5' 1"  (1.549 m)   Wt 209 lb 12.8 oz (95.2 kg)   BMI 39.64 kg/m   Physical Exam:  Well appearing 83 old woman, NAD HEENT: Unremarkable Neck:   7 cm JVD, no thyromegally Lungs:  Clear with no wheezes, rales, or rhonchi. HEART:  Regular rate rhythm, no murmurs, no rubs, no clicks Abd:  soft, positive bowel sounds, no organomegally, no rebound, no guarding Ext:  2 plus pulses, no edema, no cyanosis, no clubbing Skin:  No rashes no nodules Neuro:  CN II through XII intact, motor grossly  intact  ECG - NSR with BiV pacing  DEVICE  Normal device function.  See PaceArt for details.   Assess/Plan:  1. Chronic systolic heart failure - her symptoms are class 2. She will continue her current meds. 2. HTN heart disease - her blood pressure is not as good. Will have her increase her cardizem to 120 mg twice daily. 3. BiV PPM - she is PM dependent from CHB. Her Medtronic device is working normally. Will follow.  4.  Fibromyalgia - she has a lot of pain in her feet and arms.   Cristopher Peru, M.D.

## 2017-02-18 ENCOUNTER — Encounter: Payer: Self-pay | Admitting: Family Medicine

## 2017-02-18 ENCOUNTER — Ambulatory Visit (INDEPENDENT_AMBULATORY_CARE_PROVIDER_SITE_OTHER): Payer: Medicare Other | Admitting: Family Medicine

## 2017-02-18 VITALS — BP 113/68 | HR 91 | Ht 61.0 in | Wt 207.0 lb

## 2017-02-18 DIAGNOSIS — E118 Type 2 diabetes mellitus with unspecified complications: Secondary | ICD-10-CM | POA: Diagnosis not present

## 2017-02-18 DIAGNOSIS — B3789 Other sites of candidiasis: Secondary | ICD-10-CM | POA: Diagnosis not present

## 2017-02-18 DIAGNOSIS — I10 Essential (primary) hypertension: Secondary | ICD-10-CM | POA: Diagnosis not present

## 2017-02-18 LAB — POCT GLYCOSYLATED HEMOGLOBIN (HGB A1C): Hemoglobin A1C: 6.7

## 2017-02-18 MED ORDER — NYSTATIN 100000 UNIT/GM EX CREA: 1.0000 "application " | TOPICAL_CREAM | Freq: Two times a day (BID) | CUTANEOUS | 1 refills | Status: DC

## 2017-02-18 NOTE — Patient Instructions (Signed)
Wash underneath breast area with a acne soap wash. Pat dry. Use a hair dryer on the cold setting to just below air and remove any excess moisture. Then apply the cream twice a day. If no significant improvement after 7 days and please call me back. If the rash is improving and continue with the cream for 5 more days after the rash has completely cleared area this is to remove any extra spores remaining on the scan.

## 2017-02-18 NOTE — Progress Notes (Signed)
Subjective:    CC: DM, new rash    HPI: Diabetes - no hypoglycemic events. No wounds or sores that are not healing well. No increased thirst or urination. Checking glucose at home. Taking medications as prescribed without any side effects.  We increased her Lantus to 20 units at the last office visit. He has actually increased up to 25 units and has done well with this without any hypoglycemic events.  Hypertension- Pt denies chest pain, SOB, dizziness, or heart palpitations.  Taking meds as directed w/o problems.  Denies medication side effects.    She also completed a rash underneath both breasts.  She says the skin is actually been weeping at times. Initially she noticed a blood blister that burst and drained and then started noticing the rash.  Hypertension-she was having side effects on the increased dose of diltiazem to her cardiologist has decreased her back down to 120 mg daily and she is feeling much better.  Past medical history, Surgical history, Family history not pertinant except as noted below, Social history, Allergies, and medications have been entered into the medical record, reviewed, and corrections made.   Review of Systems: No fevers, chills, night sweats, weight loss, chest pain, or shortness of breath.   Objective:    General: Well Developed, well nourished, and in no acute distress.  Neuro: Alert and oriented x3, extra-ocular muscles intact, sensation grossly intact.  HEENT: Normocephalic, atraumatic  Skin: Warm and dry. She does have a moist appearing erythematous demarcated and confluent rash between the breasts and going underneath the left breast. Cardiac: Regular rate and rhythm, no murmurs rubs or gallops, no lower extremity edema.  Respiratory: Clear to auscultation bilaterally. Not using accessory muscles, speaking in full sentences.   Impression and Recommendations:   DM -Much improved. Hemoglobin A1c down to 6.7 today. Continue with 25 units of  Lantus.  RAsh - will treat with nystatin topical peak cream. Instructions printed on the ABS.  HTN - Well controlled. Continue current regimen. Follow up in  3-4 months.

## 2017-02-19 LAB — COMPLETE METABOLIC PANEL WITH GFR
ALBUMIN: 3.9 g/dL (ref 3.6–5.1)
ALK PHOS: 112 U/L (ref 33–130)
ALT: 11 U/L (ref 6–29)
AST: 15 U/L (ref 10–35)
BILIRUBIN TOTAL: 0.4 mg/dL (ref 0.2–1.2)
BUN: 15 mg/dL (ref 7–25)
CALCIUM: 9.4 mg/dL (ref 8.6–10.4)
CO2: 27 mmol/L (ref 20–31)
CREATININE: 0.81 mg/dL (ref 0.50–0.99)
Chloride: 104 mmol/L (ref 98–110)
GFR, EST AFRICAN AMERICAN: 87 mL/min (ref >=60)
GFR, EST NON AFRICAN AMERICAN: 75 mL/min (ref >=60)
Glucose, Bld: 137 mg/dL — ABNORMAL HIGH (ref 65–99)
Potassium: 4.5 mmol/L (ref 3.5–5.3)
Sodium: 141 mmol/L (ref 135–146)
TOTAL PROTEIN: 6.6 g/dL (ref 6.1–8.1)

## 2017-02-19 LAB — LIPID PANEL
CHOLESTEROL: 187 mg/dL (ref <200)
HDL: 73 mg/dL (ref >50)
LDL CALC: 88 mg/dL (ref <100)
TRIGLYCERIDES: 128 mg/dL (ref <150)
Total CHOL/HDL Ratio: 2.6 Ratio (ref <5.0)
VLDL: 26 mg/dL (ref <30)

## 2017-03-15 LAB — HM DIABETES EYE EXAM

## 2017-03-18 DIAGNOSIS — K740 Hepatic fibrosis: Secondary | ICD-10-CM | POA: Diagnosis not present

## 2017-03-18 DIAGNOSIS — K8020 Calculus of gallbladder without cholecystitis without obstruction: Secondary | ICD-10-CM | POA: Diagnosis not present

## 2017-03-26 ENCOUNTER — Encounter: Payer: Self-pay | Admitting: Family Medicine

## 2017-03-26 DIAGNOSIS — K5900 Constipation, unspecified: Secondary | ICD-10-CM | POA: Diagnosis not present

## 2017-03-26 DIAGNOSIS — R1011 Right upper quadrant pain: Secondary | ICD-10-CM | POA: Diagnosis not present

## 2017-03-26 DIAGNOSIS — K8020 Calculus of gallbladder without cholecystitis without obstruction: Secondary | ICD-10-CM | POA: Diagnosis not present

## 2017-03-26 DIAGNOSIS — K740 Hepatic fibrosis: Secondary | ICD-10-CM | POA: Diagnosis not present

## 2017-03-30 DIAGNOSIS — R1011 Right upper quadrant pain: Secondary | ICD-10-CM | POA: Diagnosis not present

## 2017-03-30 DIAGNOSIS — K8020 Calculus of gallbladder without cholecystitis without obstruction: Secondary | ICD-10-CM | POA: Diagnosis not present

## 2017-03-31 ENCOUNTER — Ambulatory Visit (INDEPENDENT_AMBULATORY_CARE_PROVIDER_SITE_OTHER): Payer: Medicare Other | Admitting: *Deleted

## 2017-03-31 DIAGNOSIS — I442 Atrioventricular block, complete: Secondary | ICD-10-CM | POA: Diagnosis not present

## 2017-04-01 ENCOUNTER — Encounter: Payer: Self-pay | Admitting: Cardiology

## 2017-04-01 NOTE — Progress Notes (Signed)
Remote pacemaker transmission.   

## 2017-04-06 ENCOUNTER — Other Ambulatory Visit: Payer: Self-pay

## 2017-04-06 MED ORDER — INSULIN GLARGINE 100 UNIT/ML ~~LOC~~ SOLN: 25.0000 [IU] | Freq: Every day | SUBCUTANEOUS | 1 refills | Status: DC

## 2017-04-08 DIAGNOSIS — G4733 Obstructive sleep apnea (adult) (pediatric): Secondary | ICD-10-CM | POA: Diagnosis not present

## 2017-04-20 LAB — CUP PACEART REMOTE DEVICE CHECK
Brady Statistic AP VP Percent: 44.21 %
Brady Statistic AP VS Percent: 0.36 %
Brady Statistic AS VP Percent: 54.57 %
Brady Statistic AS VS Percent: 0.86 %
Brady Statistic RA Percent Paced: 44.16 %
Brady Statistic RV Percent Paced: 98.16 %
Date Time Interrogation Session: 20180808184325
Implantable Lead Implant Date: 20030324
Implantable Lead Implant Date: 20030324
Implantable Lead Location: 753860
Lead Channel Impedance Value: 1197 Ohm
Lead Channel Impedance Value: 361 Ohm
Lead Channel Impedance Value: 399 Ohm
Lead Channel Impedance Value: 399 Ohm
Lead Channel Impedance Value: 4047 Ohm
Lead Channel Impedance Value: 4047 Ohm
Lead Channel Pacing Threshold Amplitude: 1 V
Lead Channel Pacing Threshold Amplitude: 2 V
Lead Channel Pacing Threshold Pulse Width: 0.4 ms
Lead Channel Pacing Threshold Pulse Width: 0.4 ms
Lead Channel Sensing Intrinsic Amplitude: 1.25 mV
Lead Channel Sensing Intrinsic Amplitude: 5.875 mV
Lead Channel Setting Pacing Amplitude: 1.5 V
Lead Channel Setting Pacing Amplitude: 2.5 V
Lead Channel Setting Pacing Pulse Width: 0.8 ms
Lead Channel Setting Pacing Pulse Width: 0.8 ms
Lead Channel Setting Sensing Sensitivity: 4 mV
MDC IDC LEAD IMPLANT DT: 20030324
MDC IDC LEAD LOCATION: 753858
MDC IDC LEAD LOCATION: 753859
MDC IDC MSMT BATTERY REMAINING LONGEVITY: 52 mo
MDC IDC MSMT BATTERY VOLTAGE: 3 V
MDC IDC MSMT LEADCHNL LV IMPEDANCE VALUE: 1007 Ohm
MDC IDC MSMT LEADCHNL LV IMPEDANCE VALUE: 4047 Ohm
MDC IDC MSMT LEADCHNL RA SENSING INTR AMPL: 1.25 mV
MDC IDC MSMT LEADCHNL RV IMPEDANCE VALUE: 551 Ohm
MDC IDC MSMT LEADCHNL RV SENSING INTR AMPL: 5.875 mV
MDC IDC PG IMPLANT DT: 20151231
MDC IDC SET LEADCHNL RV PACING AMPLITUDE: 3.5 V

## 2017-05-04 ENCOUNTER — Other Ambulatory Visit: Payer: Self-pay | Admitting: Family Medicine

## 2017-05-05 ENCOUNTER — Ambulatory Visit: Payer: Medicare Other | Admitting: Family Medicine

## 2017-05-05 ENCOUNTER — Ambulatory Visit (INDEPENDENT_AMBULATORY_CARE_PROVIDER_SITE_OTHER): Payer: Medicare Other | Admitting: Osteopathic Medicine

## 2017-05-05 ENCOUNTER — Ambulatory Visit (INDEPENDENT_AMBULATORY_CARE_PROVIDER_SITE_OTHER): Payer: Medicare Other

## 2017-05-05 ENCOUNTER — Encounter: Payer: Self-pay | Admitting: Osteopathic Medicine

## 2017-05-05 VITALS — BP 136/56 | HR 78 | Temp 98.0°F | Wt 222.0 lb

## 2017-05-05 DIAGNOSIS — R05 Cough: Secondary | ICD-10-CM

## 2017-05-05 DIAGNOSIS — J209 Acute bronchitis, unspecified: Secondary | ICD-10-CM | POA: Diagnosis not present

## 2017-05-05 DIAGNOSIS — R059 Cough, unspecified: Secondary | ICD-10-CM

## 2017-05-05 MED ORDER — BENZONATATE 200 MG PO CAPS: 200.0000 mg | ORAL_CAPSULE | Freq: Three times a day (TID) | ORAL | 0 refills | Status: DC | PRN

## 2017-05-05 MED ORDER — AZITHROMYCIN 250 MG PO TABS: ORAL_TABLET | ORAL | 0 refills | Status: DC

## 2017-05-05 MED ORDER — PREDNISONE 20 MG PO TABS: 20.0000 mg | ORAL_TABLET | Freq: Two times a day (BID) | ORAL | 0 refills | Status: DC

## 2017-05-05 NOTE — Patient Instructions (Signed)
Plan:  Xray does not appear to have any pneumonia or fluid on the lungs   I think we are looking at possible bronchitis rather than fluid on the lungs or heart issue. Recommend treat with antibiotics, steroids, cough medicine and recheck with Dr. Madilyn Fireman next week - if cough no better may need to do further testing

## 2017-05-05 NOTE — Progress Notes (Signed)
HPI: Tanya Hogan is a 66 y.o. female who presents to [facility] 05/05/17 for chief complaint of:  Chief Complaint  Patient presents with  . Cough    Acute Illness: . Context: History of allergy issues, postnasal drip - unable to tolerate po allergy medications or nasal sprays, questionable diagnosis previously of COPD/asthma. . Location/Quality: Dry cough most of the day, occasional productive of scant mucus morning/evening, fatigue . Assoc signs/symptoms: see ROS . Duration: About a month    Past medical, social and family history reviewed.   Immune compromising conditions or other risk factors: hx asthma, remote hx smoker   Current medications and allergies reviewed.     Review of Systems:  Constitutional: No  fever/chills  HEENT: sinus headache, No  sore throat, No  swollen glands  Cardiovascular: No chest pain  Respiratory:Yes  cough, No  shortness of breath  Gastrointestinal: No  nausea, No  vomiting,  No  diarrhea  Musculoskeletal:   No  myalgia/arthralgia  Skin/Integument:  No  rash   Detailed Exam:  BP (!) 136/56   Pulse 78   Temp 98 F (36.7 C) (Oral)   Wt 222 lb (100.7 kg)   BMI 41.95 kg/m   Constitutional:   VSS, see above.   General Appearance: alert, well-developed, well-nourished, NAD  Eyes:   Normal lids and conjunctive, non-icteric sclera  Ears, Nose, Mouth, Throat:   Normal external inspection ears/nares  Normal mouth/lips/gums, MMM  normal TM  posterior pharynx without erythema, without exudate  nasal mucosa normal  Skin:  Normal inspection, no rash or concerning lesions noted on limited exam  Neck:   No masses, trachea midline. normal lymph nodes  Respiratory:   Normal respiratory effort.   No  wheeze/rhonchi/rales  Cardiovascular:   S1/S2 normal, no murmur/rub/gallop auscultated. RRR.     Chest x-ray on personal review stable mild enlarged heart, no pulmonary  effusion, questionable vascular congestion, await radiology over read.  No results found.   ASSESSMENT/PLAN:  Cough - Plan: DG Chest 2 View, benzonatate (TESSALON) 200 MG capsule  Acute bronchitis, unspecified organism - Plan: azithromycin (ZITHROMAX) 250 MG tablet, predniSONE (DELTASONE) 20 MG tablet     Patient Instructions  Plan:  Xray does not appear to have any pneumonia or fluid on the lungs   I think we are looking at possible bronchitis rather than fluid on the lungs or heart issue. Recommend treat with antibiotics, steroids, cough medicine and recheck with Dr. Madilyn Fireman next week - if cough no better may need to do further testing   Differential diagnosis would of course include postnasal drip, GERD, other bronchospasm, COPD/asthma. Follow-up with PCP for further evaluation of possible other causes of no resolution  Visit summary was printed for the patient with medications and pertinent instructions for patient to review. ER/RTC precautions reviewed. All questions answered. Return for recheck cough with Dr. Madilyn Fireman 1-2 weeks.

## 2017-05-07 ENCOUNTER — Ambulatory Visit: Payer: Medicare Other | Admitting: Family Medicine

## 2017-05-12 DIAGNOSIS — L509 Urticaria, unspecified: Secondary | ICD-10-CM | POA: Diagnosis not present

## 2017-05-12 DIAGNOSIS — J4540 Moderate persistent asthma, uncomplicated: Secondary | ICD-10-CM | POA: Diagnosis not present

## 2017-05-12 DIAGNOSIS — J3089 Other allergic rhinitis: Secondary | ICD-10-CM | POA: Diagnosis not present

## 2017-05-12 DIAGNOSIS — J3081 Allergic rhinitis due to animal (cat) (dog) hair and dander: Secondary | ICD-10-CM | POA: Diagnosis not present

## 2017-05-12 DIAGNOSIS — J301 Allergic rhinitis due to pollen: Secondary | ICD-10-CM | POA: Diagnosis not present

## 2017-05-13 ENCOUNTER — Ambulatory Visit (INDEPENDENT_AMBULATORY_CARE_PROVIDER_SITE_OTHER): Payer: Medicare Other | Admitting: Physician Assistant

## 2017-05-13 VITALS — BP 160/98 | HR 78 | Temp 97.9°F | Wt 222.0 lb

## 2017-05-13 DIAGNOSIS — J441 Chronic obstructive pulmonary disease with (acute) exacerbation: Secondary | ICD-10-CM

## 2017-05-13 DIAGNOSIS — R609 Edema, unspecified: Secondary | ICD-10-CM | POA: Diagnosis not present

## 2017-05-13 DIAGNOSIS — I509 Heart failure, unspecified: Secondary | ICD-10-CM

## 2017-05-13 NOTE — Progress Notes (Signed)
HPI:                                                                Tanya Hogan is a 66 y.o. female who presents to [facility] today for shortness of breath  This pleasant 66 yo female with PMH of CHF, COPD, OSA, asthma/allergies, HTN, DM II and hypothyroidism presents today with shortness of breath and cough.  Reports she is having a bronchospastic cough that makes it hard to breathe. Reports she is using her albuterol nebulizer every 6 hours. Her allergist increased her Advair and instructed her to follow-up with her PCP as soon as possible.  Past Medical History:  Diagnosis Date  . Alkaline phosphatase elevation   . Breast cancer ([place]) 2011  . Carpal tunnel syndrome   . CHF (congestive heart failure) ([place])   . Closed fracture of unspecified part of radius (alone)   . DDD (degenerative disc disease), lumbar   . Depression   . Fibromyalgia   . HTN (hypertension)   . Hypothyroidism   . IBS (irritable bowel syndrome)   . LBBB (left bundle branch block)    Past Surgical History:  Procedure Laterality Date  . BREAST LUMPECTOMY    . FOOT SURGERY    . KNEE SURGERY    . PACEMAKER GENERATOR CHANGE N/A 08/23/2014   Procedure: PACEMAKER GENERATOR CHANGE;  Surgeon: Evans Lance, MD;  Location: [facility] CATH [facility];  Service: Cardiovascular;  Laterality: N/A;  . PACEMAKER PLACEMENT  2009   [facility]  . RECTAL SURGERY  1976  . TUBAL LIGATION     Social History  Substance Use Topics  . Smoking status: Former Smoker    Quit date: 08/24/1966  . Smokeless tobacco: Never Used  . Alcohol use No   family history includes Breast cancer in her unknown relative; Heart failure in her unknown relative; Hypertension in her unknown relative; Melanoma in her mother.  ROS: negative except as noted in the HPI  Medications: Current Outpatient Prescriptions  Medication Sig Dispense Refill  . ACCU-CHEK FASTCLIX LANCETS MISC Test twice a day  204 each 11  . ACCU-CHEK SMARTVIEW test strip Test two times daily 200 each 11  . acetaminophen (TYLENOL) 325 MG tablet Take 650 mg by mouth every 6 (six) hours as needed (pain).     [name] albuterol (PROVENTIL HFA;VENTOLIN HFA) 108 (90 Base) MCG/ACT inhaler Inhale 2 puffs into the lungs every 4 (four) hours as needed for wheezing or shortness of breath.    . AMBULATORY NON FORMULARY MEDICATION Medication Name: Needs tubing and suplies for her nebulizer machine. Dx COPD. [facility] 1 vial 0  . AMBULATORY NON FORMULARY MEDICATION Fastclix lancets Dx: E11.9 300 each 3  . atorvastatin (LIPITOR) 20 MG tablet Take 1 tablet (20 mg total) by mouth daily. 90 tablet 3  . BD PEN NEEDLE NANO U/F 32G X 4 MM MISC Test two times daily 180 each 11  . benzonatate (TESSALON) 200 MG capsule Take 1 capsule (200 mg total) by mouth 3 (three) times daily as needed for cough. 30 capsule 0  . Blood Glucose Monitoring Suppl (ACCU-CHEK NANO SMARTVIEW) w/Device KIT Test two times daily 1 kit 0  . bumetanide (BUMEX) 1 MG tablet  TAKE 1 TABLET BY MOUTH TWO  TIMES DAILY 180 tablet 1  . BYSTOLIC 10 MG tablet TAKE 1 TABLET BY MOUTH  DAILY 90 tablet 1  . dicyclomine (BENTYL) 20 MG tablet Take 10-20 mg by mouth every 8 (eight) hours as needed for spasms. Take 1/2-1 tablet by mouth every eights hours as needed for cramps  11  . diltiazem (CARDIZEM) 120 MG tablet Take 120 mg by mouth daily.    [name] EPIPEN 2-PAK 0.3 MG/0.3ML SOAJ injection INJECT 1 PEN INTO MUSCLE OF LATERAL THIGH FOR SEVERE ALLERGIC REACTION. CALL 911 AFTER USING THIS MEDICATION.  2  . Fluticasone-Salmeterol (ADVAIR) 250-50 MCG/DOSE AEPB Frequency:two times daily   Dosage:250-50   MCG/DOSE  Instructions:Advair Diskus (250-50MCG/DOSE MISC, 2  puff  Inhalation two times daily)  Note:    . insulin glargine (LANTUS) 100 UNIT/ML injection Inject 0.25 mLs (25 Units total) into the skin at bedtime. 10 mL 1  . LANTUS SOLOSTAR 100 UNIT/ML Solostar Pen INJECT 25 UNITS   SUBCUTANEOUSLY AT BEDTIME 15 mL 0  . levothyroxine (SYNTHROID, LEVOTHROID) 75 MCG tablet TAKE 1 TABLET BY MOUTH  DAILY 90 tablet 1  . nystatin cream (MYCOSTATIN) Apply 1 application topically 2 (two) times daily. 60 g 1  . pantoprazole (PROTONIX) 40 MG tablet TAKE 1 TABLET BY MOUTH  EVERY MORNING AS DIRECTED    . predniSONE (DELTASONE) 20 MG tablet Take 1 tablet (20 mg total) by mouth 2 (two) times daily with a meal. 10 tablet 0  . SPIRIVA HANDIHALER 18 MCG inhalation capsule Inhale the contents of 1  capsule via HandiHaler  daily 30 capsule 0   No current facility-administered medications for this visit.    Allergies  Allergen Reactions  . Penicillins Anaphylaxis  . Petrolatum-Zinc Oxide Rash  . Accupril [Quinapril Hcl] Other (See Comments)    Hyperkalemia  . Aspirin Other (See Comments)    Pt states "burns holes in stomach"  . Baking Soda-Fluoride [Sodium Fluoride] Itching  . Codeine Nausea And Vomiting  . Fluticasone Other (See Comments)    Nasal ulcer.    . Furosemide Other (See Comments)    Nose bleed  . Lactose Intolerance (Gi) Other (See Comments)    Increased calcium, sneezing, nasal issues  . Levocetirizine     Doesn't recall  . Losartan Potassium Itching  . Metformin And Related Diarrhea  . Nasonex [Mometasone Furoate] Other (See Comments)    nosebleed  . Quinine     unknown  . Rifampin Other (See Comments)    DOES NOT REMEMBER THIS MEDICATION OR ALLERGY  . Clindamycin/Lincomycin Rash  . Iodinated Diagnostic Agents Itching and Rash    Pt states she has had ultrasound with dye but was fine  . Miralax [Polyethylene Glycol] Rash  . Sulfa Antibiotics Rash  . Sulfamethoxazole Rash  . Sulfonamide Derivatives Rash  . Wool Alcohol [Lanolin] Rash  . Zoledronic Acid Other (See Comments) and Rash    shaking       Objective:  BP (!) 160/98   Pulse 78   Temp 97.9 F (36.6 C) (Oral)   Wt 222 lb (100.7 kg)   SpO2 94%   BMI 41.95 kg/m  Gen: well-groomed,  cooperative, not ill-appearing, no distress HEENT: normal conjunctiva, wearing glasses, trachea midline Pulm: slightly tachypnic at 22 respirations/min, normal phonation, clear to auscultation bilaterally, unable to fully auscultate expiratory breath sounds due to cough CV: Normal rate, regular rhythm, s1 and s2 distinct, no murmurs, clicks or rubs  Neuro: alert and  oriented x 3, EOM's intact, no tremor MSK: extremities atraumatic, normal gait and station, 1+ bilateral peripheral edema Skin: warm, dry, intact; no rashes on exposed skin, no cyanosis  No results found for this or any previous visit (from the past 72 hour(s)). No results found.      Study Result   CLINICAL DATA:  Cough, congestion for a month  EXAM: CHEST  2 VIEW  COMPARISON:  None.  FINDINGS: There is no focal parenchymal opacity. There is no pleural effusion or pneumothorax. The heart and mediastinal contours are unremarkable. There is a 3 lead cardiac pacemaker.  The osseous structures are unremarkable.  IMPRESSION: No active cardiopulmonary disease.   Electronically Signed   By: Kathreen Devoid   On: 05/06/2017 08:15    Assessment and Plan: 66 y.o. female with   1. COPD with acute exacerbation ([place]) - reviewed recent CXR from 05/06/17, which was negative for cardiopulmonary disease - patient is on maximum medical therapy as an outpatient. Recommend increasing Spiriva to 2 inhalations every 4-6 hours. SpO294% on RA at rest and with exertion during 6 minute walk. She did stop the test at 5 min 30 sec and took multiple breaks - B Nat Peptide to r/o CHF exacerbation - CBC with Differential/Platelet - Comprehensive metabolic panel  2. Chronic congestive heart failure, unspecified heart failure type ([place]) - B Nat Peptide  3. Peripheral edema - venous stasis versus CHF - BNP as above - cont Bumex bid  Patient education and anticipatory guidance given Patient agrees with treatment  plan Follow-up with PCP in 1 week or sooner as needed if symptoms worsen or fail to improve  Darlyne Russian PA-C

## 2017-05-13 NOTE — Patient Instructions (Signed)
-   Increase Spiriva to 2 inhalations every 4-6 hours  - Follow-up with Lung Doctor or Dr. Joellyn Quails   Chronic Obstructive Pulmonary Disease Exacerbation Chronic obstructive pulmonary disease (COPD) is a common lung condition in which airflow from the lungs is limited. COPD is a general term that can be used to describe many different lung problems that limit airflow, including chronic bronchitis and emphysema. COPD exacerbations are episodes when breathing symptoms become much worse and require extra treatment. Without treatment, COPD exacerbations can be life threatening, and frequent COPD exacerbations can cause further damage to your lungs. What are the causes?  Respiratory infections.  Exposure to smoke.  Exposure to air pollution, chemical fumes, or dust. Sometimes there is no apparent cause or trigger. What increases the risk?  Smoking cigarettes.  Older age.  Frequent prior COPD exacerbations. What are the signs or symptoms?  Increased coughing.  Increased thick spit (sputum) production.  Increased wheezing.  Increased shortness of breath.  Rapid breathing.  Chest tightness. How is this diagnosed? Your medical history, a physical exam, and tests will help your health care provider make a diagnosis. Tests may include:  A chest X-ray.  Basic lab tests.  Sputum testing.  An arterial blood gas test.  How is this treated? Depending on the severity of your COPD exacerbation, you may need to be admitted to a hospital for treatment. Some of the treatments commonly used to treat COPD exacerbations are:  Antibiotic medicines.  Bronchodilators. These are drugs that expand the air passages. They may be given with an inhaler or nebulizer. Spacer devices may be needed to help improve drug delivery.  Corticosteroid medicines.  Supplemental oxygen therapy.  Airway clearing techniques, such as noninvasive ventilation (NIV) and positive expiratory pressure (PEP). These  provide respiratory support through a mask or other noninvasive device.  Follow these instructions at home:  Do not smoke. Quitting smoking is very important to prevent COPD from getting worse and exacerbations from happening as often.  Avoid exposure to all substances that irritate the airway, especially to tobacco smoke.  If you were prescribed an antibiotic medicine, finish it all even if you start to feel better.  Take all medicines as directed by your health care provider.It is important to use correct technique with inhaled medicines.  Drink enough fluids to keep your urine clear or pale yellow (unless you have a medical condition that requires fluid restriction).  Use a cool mist vaporizer. This makes it easier to clear your chest when you cough.  If you have a home nebulizer and oxygen, continue to use them as directed.  Maintain all necessary vaccinations to prevent infections.  Exercise regularly.  Eat a healthy diet.  Keep all follow-up appointments as directed by your health care provider. Get help right away if:  You have worsening shortness of breath.  You have trouble talking.  You have severe chest pain.  You have blood in your sputum.  You have a fever.  You have weakness, vomit repeatedly, or faint.  You feel confused.  You continue to get worse. This information is not intended to replace advice given to you by your health care provider. Make sure you discuss any questions you have with your health care provider. Document Released: 06/07/2007 Document Revised: 01/16/2016 Document Reviewed: 04/14/2013 Elsevier Interactive Patient Education  2017 [employer].

## 2017-05-14 ENCOUNTER — Encounter: Payer: Self-pay | Admitting: Physician Assistant

## 2017-05-14 ENCOUNTER — Other Ambulatory Visit: Payer: Self-pay | Admitting: Family Medicine

## 2017-05-14 DIAGNOSIS — D509 Iron deficiency anemia, unspecified: Secondary | ICD-10-CM | POA: Insufficient documentation

## 2017-05-14 HISTORY — DX: Iron deficiency anemia, unspecified: D50.9

## 2017-05-14 LAB — COMPREHENSIVE METABOLIC PANEL
AG RATIO: 1.5 (calc) (ref 1.0–2.5)
ALBUMIN MSPROF: 4.1 g/dL (ref 3.6–5.1)
ALKALINE PHOSPHATASE (APISO): 112 U/L (ref 33–130)
ALT: 12 U/L (ref 6–29)
AST: 13 U/L (ref 10–35)
BUN: 20 mg/dL (ref 7–25)
CHLORIDE: 105 mmol/L (ref 98–110)
CO2: 24 mmol/L (ref 20–32)
Calcium: 9.6 mg/dL (ref 8.6–10.4)
Creat: 0.93 mg/dL (ref 0.50–0.99)
GLUCOSE: 244 mg/dL — AB (ref 65–99)
Globulin: 2.7 g/dL (calc) (ref 1.9–3.7)
POTASSIUM: 4.8 mmol/L (ref 3.5–5.3)
Sodium: 138 mmol/L (ref 135–146)
Total Bilirubin: 0.4 mg/dL (ref 0.2–1.2)
Total Protein: 6.8 g/dL (ref 6.1–8.1)

## 2017-05-14 LAB — CBC WITH DIFFERENTIAL/PLATELET
BASOS PCT: 0.2 %
Basophils Absolute: 26 cells/uL (ref 0–200)
EOS ABS: 0 {cells}/uL — AB (ref 15–500)
Eosinophils Relative: 0 %
HCT: 32.4 % — ABNORMAL LOW (ref 35.0–45.0)
HEMOGLOBIN: 9.8 g/dL — AB (ref 11.7–15.5)
LYMPHS ABS: 883 {cells}/uL (ref 850–3900)
MCH: 23.3 pg — AB (ref 27.0–33.0)
MCHC: 30.2 g/dL — ABNORMAL LOW (ref 32.0–36.0)
MCV: 77 fL — AB (ref 80.0–100.0)
MPV: 10.4 fL (ref 7.5–12.5)
Monocytes Relative: 2 %
NEUTROS ABS: 11635 {cells}/uL — AB (ref 1500–7800)
Neutrophils Relative %: 90.9 %
Platelets: 396 10*3/uL (ref 140–400)
RBC: 4.21 10*6/uL (ref 3.80–5.10)
RDW: 15.5 % — ABNORMAL HIGH (ref 11.0–15.0)
Total Lymphocyte: 6.9 %
WBC: 12.8 10*3/uL — ABNORMAL HIGH (ref 3.8–10.8)
WBCMIX: 256 {cells}/uL (ref 200–950)

## 2017-05-14 LAB — BRAIN NATRIURETIC PEPTIDE: Brain Natriuretic Peptide: 84 pg/mL (ref <100)

## 2017-05-14 NOTE — Progress Notes (Signed)
1. White blood cell count is elevated, which is consistent with COPD exacerbation 2. No evidence of heart failure exacerbation 3. She is anemic - this is new. Recommend close follow-up with Dr. Joellyn Quails next week to discuss need for colonoscopy or further work-up

## 2017-05-17 ENCOUNTER — Encounter: Payer: Self-pay | Admitting: Family Medicine

## 2017-05-17 ENCOUNTER — Ambulatory Visit (INDEPENDENT_AMBULATORY_CARE_PROVIDER_SITE_OTHER): Payer: Medicare Other | Admitting: Family Medicine

## 2017-05-17 VITALS — BP 131/56 | HR 81 | Temp 97.4°F | Ht 61.0 in | Wt 221.0 lb

## 2017-05-17 DIAGNOSIS — J441 Chronic obstructive pulmonary disease with (acute) exacerbation: Secondary | ICD-10-CM

## 2017-05-17 DIAGNOSIS — R05 Cough: Secondary | ICD-10-CM

## 2017-05-17 DIAGNOSIS — D509 Iron deficiency anemia, unspecified: Secondary | ICD-10-CM | POA: Diagnosis not present

## 2017-05-17 DIAGNOSIS — R059 Cough, unspecified: Secondary | ICD-10-CM

## 2017-05-17 DIAGNOSIS — Z23 Encounter for immunization: Secondary | ICD-10-CM

## 2017-05-17 MED ORDER — BENZONATATE 200 MG PO CAPS: 200.0000 mg | ORAL_CAPSULE | Freq: Three times a day (TID) | ORAL | 0 refills | Status: DC | PRN

## 2017-05-17 NOTE — Progress Notes (Signed)
Subjective:    Patient ID: Tanya Hogan, female    DOB: 03-20-1951, 66 y.o.   MRN: [id number]  HPI [name] is a 66 year old female to do he is here today to follow-up for recent exacerbation of COPD. Currently on Spiriva and Advair. She was given prednisone. Overall she reports that she is feeling better. She still has some cough and some sputum production but has been able to walk some without getting very short of breath. Upon having blood work she was also found to be anemic. Her hemoglobin dropped to 9.8 in comparison to a normal hemoglobin a year ago. Also the MCV is small. Last colonoscopy was in 2015.She had a chest x-ray on September 12 which was essentially normal. No sign of pleural effusion or pneumothorax.When I asked her about any blood in the stool she mentioned that she had had a couple of dark sticky-looking stools while she's been sick but none before that.   Review of Systems  BP (!) 131/56   Pulse 81   Temp (!) 97.4 F (36.3 C)   Ht _0  (1.549 m)   Wt 221 lb (100.2 kg)   SpO2 97%   PF 400 L/min   BMI 41.76 kg/m     Allergies  Allergen Reactions  . Penicillins Anaphylaxis  . Petrolatum-Zinc Oxide Rash  . Accupril [Quinapril Hcl] Other (See Comments)    Hyperkalemia  . Aspirin Other (See Comments)    Pt states "burns holes in stomach"  . Baking Soda-Fluoride [Sodium Fluoride] Itching  . Codeine Nausea And Vomiting  . Fluticasone Other (See Comments)    Nasal ulcer.    . Furosemide Other (See Comments)    Nose bleed  . Lactose Intolerance (Gi) Other (See Comments)    Increased calcium, sneezing, nasal issues  . Levocetirizine     Doesn't recall  . Losartan Potassium Itching  . Metformin And Related Diarrhea  . Nasonex [Mometasone Furoate] Other (See Comments)    nosebleed  . Quinine     unknown  . Rifampin Other (See Comments)    DOES NOT REMEMBER THIS MEDICATION OR ALLERGY  . Clindamycin/Lincomycin Rash  . Iodinated Diagnostic Agents Itching and  Rash    Pt states she has had ultrasound with dye but was fine  . Miralax [Polyethylene Glycol] Rash  . Sulfa Antibiotics Rash  . Sulfamethoxazole Rash  . Sulfonamide Derivatives Rash  . Wool Alcohol [Lanolin] Rash  . Zoledronic Acid Other (See Comments) and Rash    shaking    Past Medical History:  Diagnosis Date  . Alkaline phosphatase elevation   . Breast cancer ([place]) 2011  . Carpal tunnel syndrome   . CHF (congestive heart failure) ([place])   . Closed fracture of unspecified part of radius (alone)   . DDD (degenerative disc disease), lumbar   . Depression   . Fibromyalgia   . HTN (hypertension)   . Hypothyroidism   . IBS (irritable bowel syndrome)   . LBBB (left bundle branch block)   . Microcytic anemia 05/14/2017    Past Surgical History:  Procedure Laterality Date  . BREAST LUMPECTOMY    . FOOT SURGERY    . KNEE SURGERY    . PACEMAKER GENERATOR CHANGE N/A 08/23/2014   Procedure: PACEMAKER GENERATOR CHANGE;  Surgeon: Evans Lance, MD;  Location: [facility] CATH LAB;  Service: Cardiovascular;  Laterality: N/A;  . PACEMAKER PLACEMENT  2009   [facility]  . RECTAL SURGERY  1976  . TUBAL LIGATION  Social History   Social History  . Marital status: Divorced    Spouse name: N/A  . Number of children: N/A  . Years of education: N/A   Occupational History  . Not on file.   Social History Main Topics  . Smoking status: Former Smoker    Quit date: 08/24/1966  . Smokeless tobacco: Never Used  . Alcohol use No  . Drug use: No  . Sexual activity: Not Currently   Other Topics Concern  . Not on file   Social History Narrative  . No narrative on file    Family History  Problem Relation Age of Onset  . Melanoma Mother   . Heart failure Unknown   . Breast cancer Unknown   . Hypertension Unknown     Outpatient Encounter Prescriptions as of 05/17/2017  Medication Sig  . ACCU-CHEK FASTCLIX LANCETS MISC Test twice a day  . ACCU-CHEK SMARTVIEW test strip  Test two times daily  . acetaminophen (TYLENOL) 325 MG tablet Take 650 mg by mouth every 6 (six) hours as needed (pain).   . ADVAIR DISKUS 500-50 MCG/DOSE AEPB   . albuterol (PROVENTIL HFA;VENTOLIN HFA) 108 (90 Base) MCG/ACT inhaler Inhale 2 puffs into the lungs every 4 (four) hours as needed for wheezing or shortness of breath.  . AMBULATORY NON FORMULARY MEDICATION Medication Name: Needs tubing and suplies for her nebulizer machine. Dx COPD. [facility]  . AMBULATORY NON FORMULARY MEDICATION Fastclix lancets Dx: E11.9  . atorvastatin (LIPITOR) 20 MG tablet Take 1 tablet (20 mg total) by mouth daily.  . BD PEN NEEDLE NANO U/F 32G X 4 MM MISC Test two times daily  . benzonatate (TESSALON) 200 MG capsule Take 1 capsule (200 mg total) by mouth 3 (three) times daily as needed for cough.  . Blood Glucose Monitoring Suppl (ACCU-CHEK NANO SMARTVIEW) w/Device KIT Test two times daily  . bumetanide (BUMEX) 1 MG tablet TAKE 1 TABLET BY MOUTH TWO  TIMES DAILY  . BYSTOLIC 10 MG tablet TAKE 1 TABLET BY MOUTH  DAILY  . dicyclomine (BENTYL) 20 MG tablet Take 10-20 mg by mouth every 8 (eight) hours as needed for spasms. Take 1/2-1 tablet by mouth every eights hours as needed for cramps  . diltiazem (CARDIZEM) 120 MG tablet Take 120 mg by mouth daily.  [name] EPIPEN 2-PAK 0.3 MG/0.3ML SOAJ injection INJECT 1 PEN INTO MUSCLE OF LATERAL THIGH FOR SEVERE ALLERGIC REACTION. CALL 911 AFTER USING THIS MEDICATION.  . insulin glargine (LANTUS) 100 UNIT/ML injection Inject 0.25 mLs (25 Units total) into the skin at bedtime.  [name] LANTUS SOLOSTAR 100 UNIT/ML Solostar Pen INJECT 25 UNITS  SUBCUTANEOUSLY AT BEDTIME  . levothyroxine (SYNTHROID, LEVOTHROID) 75 MCG tablet TAKE 1 TABLET BY MOUTH  DAILY  . nystatin cream (MYCOSTATIN) Apply 1 application topically 2 (two) times daily.  . pantoprazole (PROTONIX) 40 MG tablet TAKE 1 TABLET BY MOUTH  EVERY MORNING AS DIRECTED  . SPIRIVA HANDIHALER 18 MCG inhalation capsule Inhale  the contents of 1  capsule via HandiHaler  daily  . [DISCONTINUED] benzonatate (TESSALON) 200 MG capsule Take 1 capsule (200 mg total) by mouth 3 (three) times daily as needed for cough.  . [DISCONTINUED] Fluticasone-Salmeterol (ADVAIR) 250-50 MCG/DOSE AEPB Frequency:two times daily   Dosage:250-50   MCG/DOSE  Instructions:Advair Diskus (250-50MCG/DOSE MISC, 2  puff  Inhalation two times daily)  Note:  . [DISCONTINUED] predniSONE (DELTASONE) 20 MG tablet Take 1 tablet (20 mg total) by mouth 2 (two) times daily with a  meal.   No facility-administered encounter medications on file as of 05/17/2017.          Objective:   Physical Exam  Constitutional: She is oriented to person, place, and time. She appears well-developed and well-nourished.  HENT:  Head: Normocephalic and atraumatic.  Right Ear: External ear normal.  Left Ear: External ear normal.  Nose: Nose normal.  Mouth/Throat: Oropharynx is clear and moist.  TMs and canals are clear.   Eyes: Pupils are equal, round, and reactive to light. Conjunctivae and EOM are normal.  Neck: Neck supple. No thyromegaly present.  Cardiovascular: Normal rate, regular rhythm and normal heart sounds.   Pulmonary/Chest: Effort normal and breath sounds normal. She has no wheezes.  Lymphadenopathy:    She has no cervical adenopathy.  Neurological: She is alert and oriented to person, place, and time.  Skin: Skin is warm and dry.  Psychiatric: She has a normal mood and affect.        Assessment & Plan:  Recent COPD exacerbation- She's completed her Antibiotics and prednisone. We'll do a peak flow on her today. Still coughing getting some pink frothy mucus in her sputum. Overall she is feeling significantly better. I will refill her Ladona Ridgel today and call if not improving or suddenly getting worse.  Microcytic anemia-we'll check iron levels.  Last colonoscopy 2015. Not due for repeat until 2020. Will check stool cards.   Dark stools - Given  stool cards today to take home to test for blood in the stool. I think we need to work also getting her back in with her GI. Her last colonoscopy was about 3 years ago and she really isn't due for repeat for 2 more years.

## 2017-05-18 LAB — IRON,TIBC AND FERRITIN PANEL
%SAT: 4 % (calc) — ABNORMAL LOW (ref 11–50)
Ferritin: 6 ng/mL — ABNORMAL LOW (ref 20–288)
Iron: 15 ug/dL — ABNORMAL LOW (ref 45–160)
TIBC: 390 mcg/dL (calc) (ref 250–450)

## 2017-05-18 LAB — CBC
HCT: 33.1 % — ABNORMAL LOW (ref 35.0–45.0)
HEMOGLOBIN: 10.1 g/dL — AB (ref 11.7–15.5)
MCH: 23.4 pg — AB (ref 27.0–33.0)
MCHC: 30.5 g/dL — AB (ref 32.0–36.0)
MCV: 76.6 fL — ABNORMAL LOW (ref 80.0–100.0)
MPV: 10.9 fL (ref 7.5–12.5)
PLATELETS: 416 10*3/uL — AB (ref 140–400)
RBC: 4.32 10*6/uL (ref 3.80–5.10)
RDW: 15.9 % — ABNORMAL HIGH (ref 11.0–15.0)
WBC: 15.4 10*3/uL — AB (ref 3.8–10.8)

## 2017-05-19 ENCOUNTER — Telehealth: Payer: Self-pay

## 2017-05-19 ENCOUNTER — Encounter: Payer: Self-pay | Admitting: Physician Assistant

## 2017-05-19 DIAGNOSIS — J3089 Other allergic rhinitis: Secondary | ICD-10-CM | POA: Diagnosis not present

## 2017-05-19 DIAGNOSIS — R609 Edema, unspecified: Secondary | ICD-10-CM | POA: Insufficient documentation

## 2017-05-19 DIAGNOSIS — J3081 Allergic rhinitis due to animal (cat) (dog) hair and dander: Secondary | ICD-10-CM | POA: Diagnosis not present

## 2017-05-19 DIAGNOSIS — L509 Urticaria, unspecified: Secondary | ICD-10-CM | POA: Diagnosis not present

## 2017-05-19 DIAGNOSIS — J4541 Moderate persistent asthma with (acute) exacerbation: Secondary | ICD-10-CM | POA: Diagnosis not present

## 2017-05-19 DIAGNOSIS — I509 Heart failure, unspecified: Secondary | ICD-10-CM | POA: Insufficient documentation

## 2017-05-19 DIAGNOSIS — J301 Allergic rhinitis due to pollen: Secondary | ICD-10-CM | POA: Diagnosis not present
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
## 2017-05-19 NOTE — Telephone Encounter (Signed)
OK , please place order. We will have to call them directly and ok to fax notes.

## 2017-05-20 NOTE — Telephone Encounter (Signed)
Order placed information faxed

## 2017-05-21 ENCOUNTER — Telehealth: Payer: Self-pay | Admitting: Family Medicine

## 2017-05-21 DIAGNOSIS — R0602 Shortness of breath: Secondary | ICD-10-CM | POA: Diagnosis not present

## 2017-05-21 DIAGNOSIS — Z950 Presence of cardiac pacemaker: Secondary | ICD-10-CM | POA: Diagnosis not present

## 2017-05-21 DIAGNOSIS — Z880 Allergy status to penicillin: Secondary | ICD-10-CM | POA: Diagnosis not present

## 2017-05-21 DIAGNOSIS — K7581 Nonalcoholic steatohepatitis (NASH): Secondary | ICD-10-CM | POA: Diagnosis not present

## 2017-05-21 DIAGNOSIS — J9811 Atelectasis: Secondary | ICD-10-CM | POA: Diagnosis not present

## 2017-05-21 DIAGNOSIS — Z794 Long term (current) use of insulin: Secondary | ICD-10-CM | POA: Diagnosis not present

## 2017-05-21 DIAGNOSIS — Z888 Allergy status to other drugs, medicaments and biological substances status: Secondary | ICD-10-CM | POA: Diagnosis not present

## 2017-05-21 DIAGNOSIS — E119 Type 2 diabetes mellitus without complications: Secondary | ICD-10-CM | POA: Diagnosis not present

## 2017-05-21 DIAGNOSIS — I509 Heart failure, unspecified: Secondary | ICD-10-CM | POA: Diagnosis not present

## 2017-05-21 DIAGNOSIS — Z79899 Other long term (current) drug therapy: Secondary | ICD-10-CM | POA: Diagnosis not present

## 2017-05-21 DIAGNOSIS — J441 Chronic obstructive pulmonary disease with (acute) exacerbation: Secondary | ICD-10-CM | POA: Diagnosis not present

## 2017-05-21 DIAGNOSIS — Z951 Presence of aortocoronary bypass graft: Secondary | ICD-10-CM | POA: Diagnosis not present

## 2017-05-21 DIAGNOSIS — Z87891 Personal history of nicotine dependence: Secondary | ICD-10-CM | POA: Diagnosis not present

## 2017-05-21 DIAGNOSIS — E1122 Type 2 diabetes mellitus with diabetic chronic kidney disease: Secondary | ICD-10-CM | POA: Diagnosis not present

## 2017-05-21 DIAGNOSIS — I5022 Chronic systolic (congestive) heart failure: Secondary | ICD-10-CM | POA: Diagnosis not present

## 2017-05-21 DIAGNOSIS — Z886 Allergy status to analgesic agent status: Secondary | ICD-10-CM | POA: Diagnosis not present

## 2017-05-21 DIAGNOSIS — Z885 Allergy status to narcotic agent status: Secondary | ICD-10-CM | POA: Diagnosis not present

## 2017-05-21 DIAGNOSIS — N189 Chronic kidney disease, unspecified: Secondary | ICD-10-CM | POA: Diagnosis not present

## 2017-05-21 DIAGNOSIS — D509 Iron deficiency anemia, unspecified: Secondary | ICD-10-CM | POA: Diagnosis not present

## 2017-05-21 DIAGNOSIS — M5136 Other intervertebral disc degeneration, lumbar region: Secondary | ICD-10-CM | POA: Diagnosis not present

## 2017-05-21 DIAGNOSIS — Z7951 Long term (current) use of inhaled steroids: Secondary | ICD-10-CM | POA: Diagnosis not present

## 2017-05-21 DIAGNOSIS — Z882 Allergy status to sulfonamides status: Secondary | ICD-10-CM | POA: Diagnosis not present

## 2017-05-21 DIAGNOSIS — I130 Hypertensive heart and chronic kidney disease with heart failure and stage 1 through stage 4 chronic kidney disease, or unspecified chronic kidney disease: Secondary | ICD-10-CM | POA: Diagnosis not present

## 2017-05-21 DIAGNOSIS — R918 Other nonspecific abnormal finding of lung field: Secondary | ICD-10-CM | POA: Diagnosis not present

## 2017-05-21 DIAGNOSIS — E785 Hyperlipidemia, unspecified: Secondary | ICD-10-CM | POA: Diagnosis not present

## 2017-05-21 DIAGNOSIS — M797 Fibromyalgia: Secondary | ICD-10-CM | POA: Diagnosis not present

## 2017-05-21 DIAGNOSIS — I110 Hypertensive heart disease with heart failure: Secondary | ICD-10-CM | POA: Diagnosis not present

## 2017-05-21 DIAGNOSIS — J4540 Moderate persistent asthma, uncomplicated: Secondary | ICD-10-CM | POA: Diagnosis not present

## 2017-05-21 DIAGNOSIS — D649 Anemia, unspecified: Secondary | ICD-10-CM | POA: Diagnosis not present

## 2017-05-21 NOTE — Telephone Encounter (Signed)
Called pt no answer or vm.Tanya Hogan, [name]

## 2017-05-21 NOTE — Telephone Encounter (Signed)
FYI: Tiffany with [facility] called.  She states Tanya Hogan's bp is 128/110, she is able to breathe but still having problems.  She is recommending that she goes to the ER.

## 2017-05-21 NOTE — Telephone Encounter (Signed)
I agree if she still short of breath and having difficulty breathing then she needs to go to the ED. She likely needs a repeat chest x-ray.

## 2017-05-24 ENCOUNTER — Ambulatory Visit (INDEPENDENT_AMBULATORY_CARE_PROVIDER_SITE_OTHER): Payer: Medicare Other | Admitting: Osteopathic Medicine

## 2017-05-24 ENCOUNTER — Other Ambulatory Visit (INDEPENDENT_AMBULATORY_CARE_PROVIDER_SITE_OTHER): Payer: Medicare Other | Admitting: *Deleted

## 2017-05-24 ENCOUNTER — Encounter: Payer: Self-pay | Admitting: Osteopathic Medicine

## 2017-05-24 ENCOUNTER — Ambulatory Visit: Payer: Medicare Other

## 2017-05-24 VITALS — BP 140/65 | HR 73 | Wt 225.0 lb

## 2017-05-24 DIAGNOSIS — I509 Heart failure, unspecified: Secondary | ICD-10-CM | POA: Diagnosis not present

## 2017-05-24 DIAGNOSIS — R0609 Other forms of dyspnea: Secondary | ICD-10-CM

## 2017-05-24 DIAGNOSIS — D509 Iron deficiency anemia, unspecified: Secondary | ICD-10-CM

## 2017-05-24 DIAGNOSIS — J449 Chronic obstructive pulmonary disease, unspecified: Secondary | ICD-10-CM | POA: Diagnosis not present

## 2017-05-24 DIAGNOSIS — I10 Essential (primary) hypertension: Secondary | ICD-10-CM

## 2017-05-24 DIAGNOSIS — Z1211 Encounter for screening for malignant neoplasm of colon: Secondary | ICD-10-CM | POA: Diagnosis not present

## 2017-05-24 LAB — POC HEMOCCULT BLD/STL (HOME/3-CARD/SCREEN)
FECAL OCCULT BLD: NEGATIVE
FECAL OCCULT BLD: NEGATIVE
FECAL OCCULT BLD: NEGATIVE

## 2017-05-24 NOTE — Progress Notes (Addendum)
HPI: Tanya Hogan is a 66 y.o. female  who presents to [place] today, 05/24/17,  for chief complaint of:  Chief Complaint  Patient presents with  . Breathing Problem    Multiple recent visits for breathing/dyspnea issues. She missed appointment with PCP earlier today so is now on my schedule as acute visit. She is complaining of persistent dry cough and dyspnea on exertion. Be coming and going. She has not followed up with her pulmonologist despite multiple recommendations to do so. No fever/chills.   05/05/17: saw me, c/o cough w/ CXR WNL, hx COPD, I treated w/ steroid burst and zithromax and advised f/u w/ PCP  05/13/17: saw colleague not PCP, same symptoms, continued Advair and increased Spiriva, 6-min walk ok for SaO2 but took multiple breaks, BNP normal, (+)Microcytic Anemia to 9.8 w/ very low iron - Patient was started on iron supplementation  05/17/17: finally saw PCP, was feeling better. (+)Hx dark stool, last colonoscopy 2015 and overdue for 2-year repeat. Stool cards given, she brings those back today.   05/21/17: 3 days ago ER visit cc SOB, BNP also ok at that point, CBC stable, CXR no infiltrate. D/c home w/ instructions to f/u with PCP and pulmonologist at [facility]. Hemoglobin stable at 9.6, normal creatinine  Recently at allergist twice in 04/2017. Records from one of these visits was reviewed, PFT at that time showed asthma without evidence for restrictive disease.  Weight gain 4 lbs in one week but no significant lower extremity swelling.  Has appt next week with [facility] - anemia/stool concern.    Past medical, surgical, social and family history reviewed: Patient Active Problem List   Diagnosis Date Noted  . Peripheral edema 05/19/2017  . Chronic congestive heart failure ([place]) 05/19/2017  . Microcytic anemia 05/14/2017  . Primary osteoarthritis of left ankle 09/24/2016  . Gastrointestinal food sensitivity 05/21/2016   . Adhesive capsulitis of right shoulder 02/24/2016  . Bilateral foot pain 01/22/2016  . NASH (nonalcoholic steatohepatitis) 05/26/2015    Class: Stage 3  . Diabetes mellitus type 2, controlled, with complications ([place]) 59/16/3846  . Obesity, Class II, BMI 35-39.9, with comorbidity 06/09/2013  . Chronic systolic heart failure ([employer]) 05/23/2013  . Asthma, moderate persistent 04/18/2013  . COPD with acute exacerbation ([place]) 04/03/2013  . Breast cancer of upper-inner quadrant of left female breast ([place]) 03/18/2010  . FATIGUE 01/09/2010  . Hyperlipidemia 11/07/2009  . Fibromyalgia 10/29/2009  . LEFT BUNDLE BRANCH BLOCK 10/23/2009  . PACEMAKER, PERMANENT 10/23/2009  . ALKALINE PHOSPHATASE, ELEVATED 10/13/2009  . Hypothyroidism 10/03/2009  . Major depressive disorder 07/02/2009  . ESSENTIAL HYPERTENSION, BENIGN 07/02/2009  . BACK PAIN, CHRONIC 07/02/2009  . Chronic systolic CHF (congestive heart failure) ([place]) 04/26/2009   Past Surgical History:  Procedure Laterality Date  . BREAST LUMPECTOMY    . FOOT SURGERY    . KNEE SURGERY    . PACEMAKER GENERATOR CHANGE N/A 08/23/2014   Procedure: PACEMAKER GENERATOR CHANGE;  Surgeon: Evans Lance, MD;  Location: [facility] CATH LAB;  Service: Cardiovascular;  Laterality: N/A;  . PACEMAKER PLACEMENT  2009   [facility]  . RECTAL SURGERY  1976  . TUBAL LIGATION     Social History  Substance Use Topics  . Smoking status: Former Smoker    Quit date: 08/24/1966  . Smokeless tobacco: Never Used  . Alcohol use No   Family History  Problem Relation Age of Onset  . Melanoma Mother   . Heart failure Unknown   .  Breast cancer Unknown   . Hypertension Unknown      Current medication list and allergy/intolerance information reviewed:   Current Outpatient Prescriptions  Medication Sig Dispense Refill  . ACCU-CHEK FASTCLIX LANCETS MISC Test twice a day 204 each 11  . ACCU-CHEK SMARTVIEW test strip Test two times daily 200 each 11  .  acetaminophen (TYLENOL) 325 MG tablet Take 650 mg by mouth every 6 (six) hours as needed (pain).     . ADVAIR DISKUS 500-50 MCG/DOSE AEPB   5  . albuterol (PROVENTIL HFA;VENTOLIN HFA) 108 (90 Base) MCG/ACT inhaler Inhale 2 puffs into the lungs every 4 (four) hours as needed for wheezing or shortness of breath.    . AMBULATORY NON FORMULARY MEDICATION Medication Name: Needs tubing and suplies for her nebulizer machine. Dx COPD. [facility] 1 vial 0  . AMBULATORY NON FORMULARY MEDICATION Fastclix lancets Dx: E11.9 300 each 3  . atorvastatin (LIPITOR) 20 MG tablet Take 1 tablet (20 mg total) by mouth daily. 90 tablet 3  . BD PEN NEEDLE NANO U/F 32G X 4 MM MISC Test two times daily 180 each 11  . benzonatate (TESSALON) 200 MG capsule Take 1 capsule (200 mg total) by mouth 3 (three) times daily as needed for cough. 30 capsule 0  . Blood Glucose Monitoring Suppl (ACCU-CHEK NANO SMARTVIEW) w/Device KIT Test two times daily 1 kit 0  . bumetanide (BUMEX) 1 MG tablet TAKE 1 TABLET BY MOUTH TWO  TIMES DAILY 180 tablet 0  . BYSTOLIC 10 MG tablet TAKE 1 TABLET BY MOUTH  DAILY 90 tablet 1  . dicyclomine (BENTYL) 20 MG tablet Take 10-20 mg by mouth every 8 (eight) hours as needed for spasms. Take 1/2-1 tablet by mouth every eights hours as needed for cramps  11  . diltiazem (CARDIZEM) 120 MG tablet Take 120 mg by mouth daily.    [name] EPIPEN 2-PAK 0.3 MG/0.3ML SOAJ injection INJECT 1 PEN INTO MUSCLE OF LATERAL THIGH FOR SEVERE ALLERGIC REACTION. CALL 911 AFTER USING THIS MEDICATION.  2  . insulin glargine (LANTUS) 100 UNIT/ML injection Inject 0.25 mLs (25 Units total) into the skin at bedtime. 10 mL 1  . LANTUS SOLOSTAR 100 UNIT/ML Solostar Pen INJECT 25 UNITS  SUBCUTANEOUSLY AT BEDTIME 15 mL 0  . levothyroxine (SYNTHROID, LEVOTHROID) 75 MCG tablet TAKE 1 TABLET BY MOUTH  DAILY 90 tablet 0  . nystatin cream (MYCOSTATIN) Apply 1 application topically 2 (two) times daily. 60 g 1  . pantoprazole (PROTONIX) 40 MG  tablet TAKE 1 TABLET BY MOUTH  EVERY MORNING AS DIRECTED    . SPIRIVA HANDIHALER 18 MCG inhalation capsule Inhale the contents of 1  capsule via HandiHaler  daily 30 capsule 0   No current facility-administered medications for this visit.    Allergies  Allergen Reactions  . Penicillins Anaphylaxis  . Petrolatum-Zinc Oxide Rash  . Accupril [Quinapril Hcl] Other (See Comments)    Hyperkalemia  . Aspirin Other (See Comments)    Pt states "burns holes in stomach"  . Baking Soda-Fluoride [Sodium Fluoride] Itching  . Codeine Nausea And Vomiting  . Fluticasone Other (See Comments)    Nasal ulcer.    . Furosemide Other (See Comments)    Nose bleed  . Lactose Intolerance (Gi) Other (See Comments)    Increased calcium, sneezing, nasal issues  . Levocetirizine     Doesn't recall  . Losartan Potassium Itching  . Metformin And Related Diarrhea  . Nasonex [Mometasone Furoate] Other (See Comments)  nosebleed  . Quinine     unknown  . Rifampin Other (See Comments)    DOES NOT REMEMBER THIS MEDICATION OR ALLERGY  . Clindamycin/Lincomycin Rash  . Iodinated Diagnostic Agents Itching and Rash    Pt states she has had ultrasound with dye but was fine  . Miralax [Polyethylene Glycol] Rash  . Sulfa Antibiotics Rash  . Sulfamethoxazole Rash  . Sulfonamide Derivatives Rash  . Wool Alcohol [Lanolin] Rash  . Zoledronic Acid Other (See Comments) and Rash    shaking      Review of Systems:  Constitutional:  No  fever, no chills, +unintentional weight gain. +significant fatigue.   HEENT: No  headache, no vision change, no hearing change, No sore throat, No  sinus pressure  Cardiac: No  chest pain, No  pressure, No palpitations, No  Orthopnea  Respiratory:  No  shortness of breath. No  Cough  Gastrointestinal: No  abdominal pain, No  nausea, No  vomiting,  No  blood in stool, No  diarrhea, No  constipation   Musculoskeletal: No new myalgia/arthralgia  Skin: No  Rash  Hem/Onc: No  easy  bruising/bleeding  Neurologic: No  weakness, No  dizziness   Exam:  BP 140/65   Pulse 73   Wt 225 lb (102.1 kg)   SpO2 95%   BMI 42.51 kg/m   Constitutional: VS see above. General Appearance: alert, well-developed, well-nourished, NAD  Eyes: Normal lids and conjunctive, non-icteric sclera  Ears, Nose, Mouth, Throat: MMM, Normal external inspection ears/nares/mouth/lips/gums.   Neck: No masses, trachea midline. No thyroid enlargement. No tenderness/mass appreciated. No lymphadenopathy  Respiratory: Normal respiratory effort. no wheeze, no rhonchi, no rales  Cardiovascular: S1/S2 normal, no murmur, no rub/gallop auscultated. RRR. No lower extremity edema. Pedal pulse II/IV bilaterally DP and PT. No JVD.   Musculoskeletal: Gait normal. No clubbing/cyanosis of digits.   Neurological: Normal balance/coordination. No tremor.   Skin: warm, dry, intact. No rash/ulcer.    Psychiatric: Normal judgment/insight. Normal mood and affect. Oriented x3.    EKG interpretation: Rhythm: paced  Rate: 69 (+)PVC  No ST/T changes concerning for acute ischemia/infarct    ASSESSMENT/PLAN: The primary encounter diagnosis was Dyspnea on exertion. Diagnoses of COPD with asthma ([place]), Microcytic anemia, Chronic congestive heart failure, unspecified heart failure type ([place]), and Essential hypertension, benign were also pertinent to this visit.   Think dyspnea is probably multifactorial  Respiratory: Known asthma/COPD. Patient is maximized on inhalers, 6 minute walk test does not meet criteria for necessity of oxygen. Recent PFT, no restrictive disease. Consideration for chronic cough due to GERD/laryngotracheal reflux, postnasal drip, consider ENT referral for scope or possibly bronchoscopy with pulmonary.   Cardiac: Has pacemaker, no chest pains. Would consider stress testing per cardiology recommendations. Initial blood pressure as measured by machine was low but manual reading was reassuring for  normal range BP  Wells criteria puts her at low probability for PE, plus the relapsing/remitting nature of the dyspnea does not seem consistent with embolus. Given chronic conditions and likely associated inflammatory issues I am reluctant to get d-dimer with low probability based on Wells.   Anemia: Continue with iron supplementation, await GI consult. Certainly this could be contributing.   Need follow-up with specialists: Pulmonology and cardiology. We'll try to arrange follow-up sooner than currently scheduled appointments.  Labs 3 days ago were stable from our recent measurements, I don't think at this point worth repeating.  Advised patient to follow-up with PCP for recheck in the next few  days, to emergency department sooner if worse        Visit summary with medication list and pertinent instructions was printed for patient to review. All questions at time of visit were answered - patient instructed to contact office with any additional concerns. ER/RTC precautions were reviewed with the patient. Follow-up plan: Return for recheck breathing with Dr Madilyn Fireman in 2-3 days .  Note: Total time spent 40 minutes, greater than 50% of the visit was spent face-to-face counseling and coordinating care for the following: The primary encounter diagnosis was Dyspnea on exertion. Diagnoses of COPD with asthma ([place]), Microcytic anemia, Chronic congestive heart failure, unspecified heart failure type ([place]), and Essential hypertension, benign were also pertinent to this visit.[name]

## 2017-05-24 NOTE — Progress Notes (Deleted)
Subjective:    CC: DM, HTN   HPI:  Diabetes - no hypoglycemic events. No wounds or sores that are not healing well. No increased thirst or urination. Checking glucose at home. Taking medications as prescribed without any side effects.  Hypertension- Pt denies chest pain, SOB, dizziness, or heart palpitations.  Taking meds as directed w/o problems.  Denies medication side effects.    CHF -   Past medical history, Surgical history, Family history not pertinant except as noted below, Social history, Allergies, and medications have been entered into the medical record, reviewed, and corrections made.   Review of Systems: No fevers, chills, night sweats, weight loss, chest pain, or shortness of breath.   Objective:    General: Well Developed, well nourished, and in no acute distress.  Neuro: Alert and oriented x3, extra-ocular muscles intact, sensation grossly intact.  HEENT: Normocephalic, atraumatic  Skin: Warm and dry, no rashes. Cardiac: Regular rate and rhythm, no murmurs rubs or gallops, no lower extremity edema.  Respiratory: Clear to auscultation bilaterally. Not using accessory muscles, speaking in full sentences.   Impression and Recommendations:    DM -   HTN -   Chronic systolic heart failure. -

## 2017-05-24 NOTE — Telephone Encounter (Signed)
Pt was seen today. Tanya Hogan, Tanya Hogan

## 2017-05-25 DIAGNOSIS — Z794 Long term (current) use of insulin: Secondary | ICD-10-CM | POA: Diagnosis not present

## 2017-05-25 DIAGNOSIS — J441 Chronic obstructive pulmonary disease with (acute) exacerbation: Secondary | ICD-10-CM | POA: Diagnosis not present

## 2017-05-25 DIAGNOSIS — I110 Hypertensive heart disease with heart failure: Secondary | ICD-10-CM | POA: Diagnosis not present

## 2017-05-25 DIAGNOSIS — K7581 Nonalcoholic steatohepatitis (NASH): Secondary | ICD-10-CM | POA: Diagnosis not present

## 2017-05-25 DIAGNOSIS — E119 Type 2 diabetes mellitus without complications: Secondary | ICD-10-CM | POA: Diagnosis not present

## 2017-05-25 DIAGNOSIS — E785 Hyperlipidemia, unspecified: Secondary | ICD-10-CM | POA: Diagnosis not present

## 2017-05-25 DIAGNOSIS — Z950 Presence of cardiac pacemaker: Secondary | ICD-10-CM | POA: Diagnosis not present

## 2017-05-25 DIAGNOSIS — D649 Anemia, unspecified: Secondary | ICD-10-CM | POA: Diagnosis not present

## 2017-05-25 DIAGNOSIS — Z7951 Long term (current) use of inhaled steroids: Secondary | ICD-10-CM | POA: Diagnosis not present

## 2017-05-25 DIAGNOSIS — M5136 Other intervertebral disc degeneration, lumbar region: Secondary | ICD-10-CM | POA: Diagnosis not present

## 2017-05-25 DIAGNOSIS — I5022 Chronic systolic (congestive) heart failure: Secondary | ICD-10-CM | POA: Diagnosis not present

## 2017-05-25 DIAGNOSIS — M797 Fibromyalgia: Secondary | ICD-10-CM | POA: Diagnosis not present

## 2017-05-25 DIAGNOSIS — J4540 Moderate persistent asthma, uncomplicated: Secondary | ICD-10-CM | POA: Diagnosis not present

## 2017-05-27 ENCOUNTER — Ambulatory Visit: Payer: Medicare Other | Admitting: Family Medicine

## 2017-05-27 NOTE — Addendum Note (Signed)
Addended by: Huel Cote on: 05/27/2017 02:27 PM   Modules accepted: Orders

## 2017-05-28 DIAGNOSIS — K7581 Nonalcoholic steatohepatitis (NASH): Secondary | ICD-10-CM | POA: Diagnosis not present

## 2017-05-28 DIAGNOSIS — E785 Hyperlipidemia, unspecified: Secondary | ICD-10-CM | POA: Diagnosis not present

## 2017-05-28 DIAGNOSIS — K740 Hepatic fibrosis: Secondary | ICD-10-CM | POA: Diagnosis not present

## 2017-05-28 DIAGNOSIS — D649 Anemia, unspecified: Secondary | ICD-10-CM | POA: Diagnosis not present

## 2017-05-28 DIAGNOSIS — Z950 Presence of cardiac pacemaker: Secondary | ICD-10-CM | POA: Diagnosis not present

## 2017-05-28 DIAGNOSIS — J441 Chronic obstructive pulmonary disease with (acute) exacerbation: Secondary | ICD-10-CM | POA: Diagnosis not present

## 2017-05-28 DIAGNOSIS — I5022 Chronic systolic (congestive) heart failure: Secondary | ICD-10-CM | POA: Diagnosis not present

## 2017-05-28 DIAGNOSIS — Z794 Long term (current) use of insulin: Secondary | ICD-10-CM | POA: Diagnosis not present

## 2017-05-28 DIAGNOSIS — K219 Gastro-esophageal reflux disease without esophagitis: Secondary | ICD-10-CM | POA: Diagnosis not present

## 2017-05-28 DIAGNOSIS — E119 Type 2 diabetes mellitus without complications: Secondary | ICD-10-CM | POA: Diagnosis not present

## 2017-05-28 DIAGNOSIS — J4540 Moderate persistent asthma, uncomplicated: Secondary | ICD-10-CM | POA: Diagnosis not present

## 2017-05-28 DIAGNOSIS — I110 Hypertensive heart disease with heart failure: Secondary | ICD-10-CM | POA: Diagnosis not present

## 2017-05-28 DIAGNOSIS — M797 Fibromyalgia: Secondary | ICD-10-CM | POA: Diagnosis not present

## 2017-05-28 DIAGNOSIS — M5136 Other intervertebral disc degeneration, lumbar region: Secondary | ICD-10-CM | POA: Diagnosis not present

## 2017-05-28 DIAGNOSIS — Z7951 Long term (current) use of inhaled steroids: Secondary | ICD-10-CM | POA: Diagnosis not present

## 2017-06-01 ENCOUNTER — Encounter: Payer: Self-pay | Admitting: Family Medicine

## 2017-06-01 ENCOUNTER — Ambulatory Visit (INDEPENDENT_AMBULATORY_CARE_PROVIDER_SITE_OTHER): Payer: Medicare Other | Admitting: Family Medicine

## 2017-06-01 VITALS — BP 132/58 | HR 78 | Ht 61.0 in | Wt 225.0 lb

## 2017-06-01 DIAGNOSIS — D509 Iron deficiency anemia, unspecified: Secondary | ICD-10-CM | POA: Diagnosis not present

## 2017-06-01 DIAGNOSIS — E118 Type 2 diabetes mellitus with unspecified complications: Secondary | ICD-10-CM

## 2017-06-01 DIAGNOSIS — J441 Chronic obstructive pulmonary disease with (acute) exacerbation: Secondary | ICD-10-CM | POA: Diagnosis not present

## 2017-06-01 LAB — POCT GLYCOSYLATED HEMOGLOBIN (HGB A1C): Hemoglobin A1C: 6.9

## 2017-06-01 NOTE — Progress Notes (Signed)
Subjective:    Patient ID: Tanya Hogan, female    DOB: 1950-10-20, 66 y.o.   MRN: [id number]  HPI She is here to follow-up from recent COPD exacerbation. In fact she has appointment with her allergist tomorrow Dr. Clarene Essex. She is currently on Advair, Spiriva and has been using her nebulizer 3 times a day. Overall she is feeling much better. She still has a dry tickling cough and still has a raspy voice but overall feels better. She is a little concerned about her throat because when she first started getting sick she noticed what looked like a small blood clot became the back of her throat and then this morning noticed a little white glob that came up from her throat as well.   Diabetes - no hypoglycemic events. No wounds or sores that are not healing well. No increased thirst or urination. Checking glucose at home. Taking medications as prescribed without any side effects. She has been on prednisone recently and says that her appetite had increased significantly.  He also wanted to let me know that she saw her GI specialist, regular PA, and they have increased her iron to twice a day. She's been on that regimen for about a week so far. She was experiencing what they felt was GI related anemia.  Review of Systems  BP (!) 132/58   Pulse 78   Ht 5' 1"  (1.549 m)   Wt 225 lb (102.1 kg)   SpO2 95%   BMI 42.51 kg/m     Allergies  Allergen Reactions  . Penicillins Anaphylaxis  . Petrolatum-Zinc Oxide Rash  . Accupril [Quinapril Hcl] Other (See Comments)    Hyperkalemia  . Aspirin Other (See Comments)    Pt states "burns holes in stomach"  . Baking Soda-Fluoride [Sodium Fluoride] Itching  . Codeine Nausea And Vomiting  . Fluticasone Other (See Comments)    Nasal ulcer.    . Furosemide Other (See Comments)    Nose bleed  . Lactose Intolerance (Gi) Other (See Comments)    Increased calcium, sneezing, nasal issues  . Levocetirizine     Doesn't recall  . Losartan Potassium Itching   . Metformin And Related Diarrhea  . Nasonex [Mometasone Furoate] Other (See Comments)    nosebleed  . Quinine     unknown  . Rifampin Other (See Comments)    DOES NOT REMEMBER THIS MEDICATION OR ALLERGY  . Clindamycin/Lincomycin Rash  . Iodinated Diagnostic Agents Itching and Rash    Pt states she has had ultrasound with dye but was fine  . Miralax [Polyethylene Glycol] Rash  . Sulfa Antibiotics Rash  . Sulfamethoxazole Rash  . Sulfonamide Derivatives Rash  . Wool Alcohol [Lanolin] Rash  . Zoledronic Acid Other (See Comments) and Rash    shaking    Past Medical History:  Diagnosis Date  . Alkaline phosphatase elevation   . Breast cancer ([place]) 2011  . Carpal tunnel syndrome   . CHF (congestive heart failure) ([place])   . Closed fracture of unspecified part of radius (alone)   . DDD (degenerative disc disease), lumbar   . Depression   . Fibromyalgia   . HTN (hypertension)   . Hypothyroidism   . IBS (irritable bowel syndrome)   . LBBB (left bundle branch block)   . Microcytic anemia 05/14/2017    Past Surgical History:  Procedure Laterality Date  . BREAST LUMPECTOMY    . FOOT SURGERY    . KNEE SURGERY    . PACEMAKER  GENERATOR CHANGE N/A 08/23/2014   Procedure: PACEMAKER GENERATOR CHANGE;  Surgeon: Evans Lance, MD;  Location: [facility] CATH LAB;  Service: Cardiovascular;  Laterality: N/A;  . PACEMAKER PLACEMENT  2009   [facility]  . RECTAL SURGERY  1976  . TUBAL LIGATION      Social History   Social History  . Marital status: Divorced    Spouse name: N/A  . Number of children: N/A  . Years of education: N/A   Occupational History  . Not on file.   Social History Main Topics  . Smoking status: Former Smoker    Quit date: 08/24/1966  . Smokeless tobacco: Never Used  . Alcohol use No  . Drug use: No  . Sexual activity: Not Currently   Other Topics Concern  . Not on file   Social History Narrative  . No narrative on file    Family History  Problem  Relation Age of Onset  . Melanoma Mother   . Heart failure Unknown   . Breast cancer Unknown   . Hypertension Unknown     Outpatient Encounter Prescriptions as of 06/01/2017  Medication Sig  . ACCU-CHEK FASTCLIX LANCETS MISC Test twice a day  . ACCU-CHEK SMARTVIEW test strip Test two times daily  . acetaminophen (TYLENOL) 325 MG tablet Take 650 mg by mouth every 6 (six) hours as needed (pain).   . ADVAIR DISKUS 500-50 MCG/DOSE AEPB   . albuterol (PROVENTIL HFA;VENTOLIN HFA) 108 (90 Base) MCG/ACT inhaler Inhale 2 puffs into the lungs every 4 (four) hours as needed for wheezing or shortness of breath.  . AMBULATORY NON FORMULARY MEDICATION Medication Name: Needs tubing and suplies for her nebulizer machine. Dx COPD. [facility]  . AMBULATORY NON FORMULARY MEDICATION Fastclix lancets Dx: E11.9  . atorvastatin (LIPITOR) 20 MG tablet Take 1 tablet (20 mg total) by mouth daily.  . BD PEN NEEDLE NANO U/F 32G X 4 MM MISC Test two times daily  . benzonatate (TESSALON) 200 MG capsule Take 1 capsule (200 mg total) by mouth 3 (three) times daily as needed for cough.  . Blood Glucose Monitoring Suppl (ACCU-CHEK NANO SMARTVIEW) w/Device KIT Test two times daily  . bumetanide (BUMEX) 1 MG tablet TAKE 1 TABLET BY MOUTH TWO  TIMES DAILY  . BYSTOLIC 10 MG tablet TAKE 1 TABLET BY MOUTH  DAILY  . dicyclomine (BENTYL) 20 MG tablet Take 10-20 mg by mouth every 8 (eight) hours as needed for spasms. Take 1/2-1 tablet by mouth every eights hours as needed for cramps  . diltiazem (CARDIZEM) 120 MG tablet Take 120 mg by mouth daily.  [name] EPIPEN 2-PAK 0.3 MG/0.3ML SOAJ injection INJECT 1 PEN INTO MUSCLE OF LATERAL THIGH FOR SEVERE ALLERGIC REACTION. CALL 911 AFTER USING THIS MEDICATION.  . ferrous sulfate 325 (65 FE) MG tablet Take by mouth.  . insulin glargine (LANTUS) 100 UNIT/ML injection Inject 0.25 mLs (25 Units total) into the skin at bedtime.  [name] LANTUS SOLOSTAR 100 UNIT/ML Solostar Pen INJECT 25 UNITS   SUBCUTANEOUSLY AT BEDTIME  . levalbuterol (XOPENEX) 1.25 MG/3ML nebulizer solution   . levothyroxine (SYNTHROID, LEVOTHROID) 75 MCG tablet TAKE 1 TABLET BY MOUTH  DAILY  . nystatin cream (MYCOSTATIN) Apply 1 application topically 2 (two) times daily.  . pantoprazole (PROTONIX) 40 MG tablet TAKE 1 TABLET BY MOUTH  EVERY MORNING AS DIRECTED  . SPIRIVA HANDIHALER 18 MCG inhalation capsule Inhale the contents of 1  capsule via HandiHaler  daily   No facility-administered encounter  medications on file as of 06/01/2017.          Objective:   Physical Exam  Constitutional: She is oriented to person, place, and time. She appears well-developed and well-nourished.  HENT:  Head: Normocephalic and atraumatic.  Mouth/Throat: Oropharynx is clear and moist.  Cardiovascular: Normal rate, regular rhythm and normal heart sounds.   Pulmonary/Chest: Effort normal and breath sounds normal.  Neurological: She is alert and oriented to person, place, and time.  Skin: Skin is warm and dry.  Psychiatric: She has a normal mood and affect. Her behavior is normal.          Assessment & Plan:  COPD exacerbation-following up with her allergist tomorrow. Continue current regimen until her allergist makes adjustments. Hopefully they'll be ordered get her off the nebulizer treatments over the next couple of weeks.  Anemia-continue with twice a day dosing of iron. Due to recheck check iron levels in about 3 weeks.  DM- not as well controlled but on prednisone recently. F/U in 3 months.   Lab Results  Component Value Date   HGBA1C 6.9 06/01/2017

## 2017-06-01 NOTE — Patient Instructions (Signed)
I will be happy to recheck your iron in about 3 weeks if you would like. I'm happy to put in a lab slip and you can go at your convenience.

## 2017-06-02 ENCOUNTER — Other Ambulatory Visit: Payer: Self-pay | Admitting: *Deleted

## 2017-06-02 DIAGNOSIS — L509 Urticaria, unspecified: Secondary | ICD-10-CM | POA: Diagnosis not present

## 2017-06-02 DIAGNOSIS — J3089 Other allergic rhinitis: Secondary | ICD-10-CM | POA: Diagnosis not present

## 2017-06-02 DIAGNOSIS — J3081 Allergic rhinitis due to animal (cat) (dog) hair and dander: Secondary | ICD-10-CM | POA: Diagnosis not present

## 2017-06-02 DIAGNOSIS — J4541 Moderate persistent asthma with (acute) exacerbation: Secondary | ICD-10-CM | POA: Diagnosis not present

## 2017-06-02 DIAGNOSIS — J301 Allergic rhinitis due to pollen: Secondary | ICD-10-CM | POA: Diagnosis not present

## 2017-06-02 MED ORDER — ATORVASTATIN CALCIUM 20 MG PO TABS: 20.0000 mg | ORAL_TABLET | Freq: Every day | ORAL | 3 refills | Status: DC

## 2017-06-04 DIAGNOSIS — Z794 Long term (current) use of insulin: Secondary | ICD-10-CM | POA: Diagnosis not present

## 2017-06-04 DIAGNOSIS — I110 Hypertensive heart disease with heart failure: Secondary | ICD-10-CM | POA: Diagnosis not present

## 2017-06-04 DIAGNOSIS — E119 Type 2 diabetes mellitus without complications: Secondary | ICD-10-CM | POA: Diagnosis not present

## 2017-06-04 DIAGNOSIS — J4540 Moderate persistent asthma, uncomplicated: Secondary | ICD-10-CM | POA: Diagnosis not present

## 2017-06-04 DIAGNOSIS — E785 Hyperlipidemia, unspecified: Secondary | ICD-10-CM | POA: Diagnosis not present

## 2017-06-04 DIAGNOSIS — M797 Fibromyalgia: Secondary | ICD-10-CM | POA: Diagnosis not present

## 2017-06-04 DIAGNOSIS — Z950 Presence of cardiac pacemaker: Secondary | ICD-10-CM | POA: Diagnosis not present

## 2017-06-04 DIAGNOSIS — Z7951 Long term (current) use of inhaled steroids: Secondary | ICD-10-CM | POA: Diagnosis not present

## 2017-06-04 DIAGNOSIS — D649 Anemia, unspecified: Secondary | ICD-10-CM | POA: Diagnosis not present

## 2017-06-04 DIAGNOSIS — I5022 Chronic systolic (congestive) heart failure: Secondary | ICD-10-CM | POA: Diagnosis not present

## 2017-06-04 DIAGNOSIS — J441 Chronic obstructive pulmonary disease with (acute) exacerbation: Secondary | ICD-10-CM | POA: Diagnosis not present

## 2017-06-04 DIAGNOSIS — M5136 Other intervertebral disc degeneration, lumbar region: Secondary | ICD-10-CM | POA: Diagnosis not present

## 2017-06-04 DIAGNOSIS — K7581 Nonalcoholic steatohepatitis (NASH): Secondary | ICD-10-CM | POA: Diagnosis not present

## 2017-06-07 DIAGNOSIS — G4733 Obstructive sleep apnea (adult) (pediatric): Secondary | ICD-10-CM | POA: Diagnosis not present

## 2017-06-07 DIAGNOSIS — I110 Hypertensive heart disease with heart failure: Secondary | ICD-10-CM | POA: Diagnosis not present

## 2017-06-07 DIAGNOSIS — Z7951 Long term (current) use of inhaled steroids: Secondary | ICD-10-CM | POA: Diagnosis not present

## 2017-06-07 DIAGNOSIS — D649 Anemia, unspecified: Secondary | ICD-10-CM | POA: Diagnosis not present

## 2017-06-07 DIAGNOSIS — J4540 Moderate persistent asthma, uncomplicated: Secondary | ICD-10-CM | POA: Diagnosis not present

## 2017-06-07 DIAGNOSIS — M797 Fibromyalgia: Secondary | ICD-10-CM | POA: Diagnosis not present

## 2017-06-07 DIAGNOSIS — E785 Hyperlipidemia, unspecified: Secondary | ICD-10-CM | POA: Diagnosis not present

## 2017-06-07 DIAGNOSIS — Z950 Presence of cardiac pacemaker: Secondary | ICD-10-CM | POA: Diagnosis not present

## 2017-06-07 DIAGNOSIS — Z794 Long term (current) use of insulin: Secondary | ICD-10-CM | POA: Diagnosis not present

## 2017-06-07 DIAGNOSIS — K7581 Nonalcoholic steatohepatitis (NASH): Secondary | ICD-10-CM | POA: Diagnosis not present

## 2017-06-07 DIAGNOSIS — M5136 Other intervertebral disc degeneration, lumbar region: Secondary | ICD-10-CM | POA: Diagnosis not present

## 2017-06-07 DIAGNOSIS — J441 Chronic obstructive pulmonary disease with (acute) exacerbation: Secondary | ICD-10-CM | POA: Diagnosis not present

## 2017-06-07 DIAGNOSIS — I5022 Chronic systolic (congestive) heart failure: Secondary | ICD-10-CM | POA: Diagnosis not present

## 2017-06-07 DIAGNOSIS — E119 Type 2 diabetes mellitus without complications: Secondary | ICD-10-CM | POA: Diagnosis not present

## 2017-06-09 DIAGNOSIS — K7581 Nonalcoholic steatohepatitis (NASH): Secondary | ICD-10-CM | POA: Diagnosis not present

## 2017-06-09 DIAGNOSIS — Z7951 Long term (current) use of inhaled steroids: Secondary | ICD-10-CM | POA: Diagnosis not present

## 2017-06-09 DIAGNOSIS — I5022 Chronic systolic (congestive) heart failure: Secondary | ICD-10-CM | POA: Diagnosis not present

## 2017-06-09 DIAGNOSIS — E785 Hyperlipidemia, unspecified: Secondary | ICD-10-CM | POA: Diagnosis not present

## 2017-06-09 DIAGNOSIS — Z950 Presence of cardiac pacemaker: Secondary | ICD-10-CM | POA: Diagnosis not present

## 2017-06-09 DIAGNOSIS — E119 Type 2 diabetes mellitus without complications: Secondary | ICD-10-CM | POA: Diagnosis not present

## 2017-06-09 DIAGNOSIS — J441 Chronic obstructive pulmonary disease with (acute) exacerbation: Secondary | ICD-10-CM | POA: Diagnosis not present

## 2017-06-09 DIAGNOSIS — I110 Hypertensive heart disease with heart failure: Secondary | ICD-10-CM | POA: Diagnosis not present

## 2017-06-09 DIAGNOSIS — M797 Fibromyalgia: Secondary | ICD-10-CM | POA: Diagnosis not present

## 2017-06-09 DIAGNOSIS — M5136 Other intervertebral disc degeneration, lumbar region: Secondary | ICD-10-CM | POA: Diagnosis not present

## 2017-06-09 DIAGNOSIS — D649 Anemia, unspecified: Secondary | ICD-10-CM | POA: Diagnosis not present

## 2017-06-09 DIAGNOSIS — Z794 Long term (current) use of insulin: Secondary | ICD-10-CM | POA: Diagnosis not present

## 2017-06-09 DIAGNOSIS — J4540 Moderate persistent asthma, uncomplicated: Secondary | ICD-10-CM | POA: Diagnosis not present

## 2017-06-11 DIAGNOSIS — Z794 Long term (current) use of insulin: Secondary | ICD-10-CM | POA: Diagnosis not present

## 2017-06-11 DIAGNOSIS — J4540 Moderate persistent asthma, uncomplicated: Secondary | ICD-10-CM | POA: Diagnosis not present

## 2017-06-11 DIAGNOSIS — I110 Hypertensive heart disease with heart failure: Secondary | ICD-10-CM | POA: Diagnosis not present

## 2017-06-11 DIAGNOSIS — M797 Fibromyalgia: Secondary | ICD-10-CM | POA: Diagnosis not present

## 2017-06-11 DIAGNOSIS — E785 Hyperlipidemia, unspecified: Secondary | ICD-10-CM | POA: Diagnosis not present

## 2017-06-11 DIAGNOSIS — D649 Anemia, unspecified: Secondary | ICD-10-CM | POA: Diagnosis not present

## 2017-06-11 DIAGNOSIS — M5136 Other intervertebral disc degeneration, lumbar region: Secondary | ICD-10-CM | POA: Diagnosis not present

## 2017-06-11 DIAGNOSIS — E119 Type 2 diabetes mellitus without complications: Secondary | ICD-10-CM | POA: Diagnosis not present

## 2017-06-11 DIAGNOSIS — K7581 Nonalcoholic steatohepatitis (NASH): Secondary | ICD-10-CM | POA: Diagnosis not present

## 2017-06-11 DIAGNOSIS — J441 Chronic obstructive pulmonary disease with (acute) exacerbation: Secondary | ICD-10-CM | POA: Diagnosis not present

## 2017-06-11 DIAGNOSIS — Z950 Presence of cardiac pacemaker: Secondary | ICD-10-CM | POA: Diagnosis not present

## 2017-06-11 DIAGNOSIS — I5022 Chronic systolic (congestive) heart failure: Secondary | ICD-10-CM | POA: Diagnosis not present

## 2017-06-11 DIAGNOSIS — Z7951 Long term (current) use of inhaled steroids: Secondary | ICD-10-CM | POA: Diagnosis not present

## 2017-06-15 DIAGNOSIS — J849 Interstitial pulmonary disease, unspecified: Secondary | ICD-10-CM | POA: Diagnosis not present

## 2017-06-15 DIAGNOSIS — R0602 Shortness of breath: Secondary | ICD-10-CM | POA: Diagnosis not present

## 2017-06-15 DIAGNOSIS — I517 Cardiomegaly: Secondary | ICD-10-CM | POA: Diagnosis not present

## 2017-06-16 DIAGNOSIS — J441 Chronic obstructive pulmonary disease with (acute) exacerbation: Secondary | ICD-10-CM | POA: Diagnosis not present

## 2017-06-16 DIAGNOSIS — E119 Type 2 diabetes mellitus without complications: Secondary | ICD-10-CM | POA: Diagnosis not present

## 2017-06-16 DIAGNOSIS — K7581 Nonalcoholic steatohepatitis (NASH): Secondary | ICD-10-CM | POA: Diagnosis not present

## 2017-06-16 DIAGNOSIS — Z950 Presence of cardiac pacemaker: Secondary | ICD-10-CM | POA: Diagnosis not present

## 2017-06-16 DIAGNOSIS — I110 Hypertensive heart disease with heart failure: Secondary | ICD-10-CM | POA: Diagnosis not present

## 2017-06-16 DIAGNOSIS — I5022 Chronic systolic (congestive) heart failure: Secondary | ICD-10-CM | POA: Diagnosis not present

## 2017-06-16 DIAGNOSIS — E785 Hyperlipidemia, unspecified: Secondary | ICD-10-CM | POA: Diagnosis not present

## 2017-06-16 DIAGNOSIS — M797 Fibromyalgia: Secondary | ICD-10-CM | POA: Diagnosis not present

## 2017-06-16 DIAGNOSIS — Z794 Long term (current) use of insulin: Secondary | ICD-10-CM | POA: Diagnosis not present

## 2017-06-16 DIAGNOSIS — M5136 Other intervertebral disc degeneration, lumbar region: Secondary | ICD-10-CM | POA: Diagnosis not present

## 2017-06-16 DIAGNOSIS — J4540 Moderate persistent asthma, uncomplicated: Secondary | ICD-10-CM | POA: Diagnosis not present

## 2017-06-16 DIAGNOSIS — D649 Anemia, unspecified: Secondary | ICD-10-CM | POA: Diagnosis not present

## 2017-06-16 DIAGNOSIS — Z7951 Long term (current) use of inhaled steroids: Secondary | ICD-10-CM | POA: Diagnosis not present

## 2017-06-18 DIAGNOSIS — E785 Hyperlipidemia, unspecified: Secondary | ICD-10-CM | POA: Diagnosis not present

## 2017-06-18 DIAGNOSIS — J441 Chronic obstructive pulmonary disease with (acute) exacerbation: Secondary | ICD-10-CM | POA: Diagnosis not present

## 2017-06-18 DIAGNOSIS — Z950 Presence of cardiac pacemaker: Secondary | ICD-10-CM | POA: Diagnosis not present

## 2017-06-18 DIAGNOSIS — I110 Hypertensive heart disease with heart failure: Secondary | ICD-10-CM | POA: Diagnosis not present

## 2017-06-18 DIAGNOSIS — Z7951 Long term (current) use of inhaled steroids: Secondary | ICD-10-CM | POA: Diagnosis not present

## 2017-06-18 DIAGNOSIS — J4540 Moderate persistent asthma, uncomplicated: Secondary | ICD-10-CM | POA: Diagnosis not present

## 2017-06-18 DIAGNOSIS — I5022 Chronic systolic (congestive) heart failure: Secondary | ICD-10-CM | POA: Diagnosis not present

## 2017-06-18 DIAGNOSIS — Z794 Long term (current) use of insulin: Secondary | ICD-10-CM | POA: Diagnosis not present

## 2017-06-18 DIAGNOSIS — M797 Fibromyalgia: Secondary | ICD-10-CM | POA: Diagnosis not present

## 2017-06-18 DIAGNOSIS — M5136 Other intervertebral disc degeneration, lumbar region: Secondary | ICD-10-CM | POA: Diagnosis not present

## 2017-06-18 DIAGNOSIS — D649 Anemia, unspecified: Secondary | ICD-10-CM | POA: Diagnosis not present

## 2017-06-18 DIAGNOSIS — E119 Type 2 diabetes mellitus without complications: Secondary | ICD-10-CM | POA: Diagnosis not present

## 2017-06-18 DIAGNOSIS — K7581 Nonalcoholic steatohepatitis (NASH): Secondary | ICD-10-CM | POA: Diagnosis not present

## 2017-06-21 ENCOUNTER — Other Ambulatory Visit: Payer: Self-pay | Admitting: Family Medicine

## 2017-06-21 ENCOUNTER — Other Ambulatory Visit: Payer: Self-pay

## 2017-06-21 DIAGNOSIS — I10 Essential (primary) hypertension: Secondary | ICD-10-CM | POA: Diagnosis not present

## 2017-06-21 DIAGNOSIS — D509 Iron deficiency anemia, unspecified: Secondary | ICD-10-CM

## 2017-06-21 LAB — CBC WITH DIFFERENTIAL/PLATELET
BASOS PCT: 0.8 %
Basophils Absolute: 59 cells/uL (ref 0–200)
EOS PCT: 1.2 %
Eosinophils Absolute: 89 cells/uL (ref 15–500)
HCT: 39.9 % (ref 35.0–45.0)
HEMOGLOBIN: 12.6 g/dL (ref 11.7–15.5)
LYMPHS ABS: 2235 {cells}/uL (ref 850–3900)
MCH: 26 pg — ABNORMAL LOW (ref 27.0–33.0)
MCHC: 31.6 g/dL — AB (ref 32.0–36.0)
MCV: 82.3 fL (ref 80.0–100.0)
MPV: 10.7 fL (ref 7.5–12.5)
Monocytes Relative: 8.1 %
NEUTROS PCT: 59.7 %
Neutro Abs: 4418 cells/uL (ref 1500–7800)
Platelets: 337 10*3/uL (ref 140–400)
RBC: 4.85 10*6/uL (ref 3.80–5.10)
RDW: 22.1 % — AB (ref 11.0–15.0)
Total Lymphocyte: 30.2 %
WBC mixed population: 599 cells/uL (ref 200–950)
WBC: 7.4 10*3/uL (ref 3.8–10.8)

## 2017-06-21 LAB — IRON,TIBC AND FERRITIN PANEL
%SAT: 37 % (calc) (ref 11–50)
FERRITIN: 45 ng/mL (ref 20–288)
IRON: 120 ug/dL (ref 45–160)
TIBC: 322 mcg/dL (calc) (ref 250–450)

## 2017-06-21 MED ORDER — DILTIAZEM HCL 120 MG PO TABS: 120.0000 mg | ORAL_TABLET | Freq: Every day | ORAL | 3 refills | Status: DC

## 2017-06-22 DIAGNOSIS — J441 Chronic obstructive pulmonary disease with (acute) exacerbation: Secondary | ICD-10-CM | POA: Diagnosis not present

## 2017-06-22 DIAGNOSIS — E119 Type 2 diabetes mellitus without complications: Secondary | ICD-10-CM | POA: Diagnosis not present

## 2017-06-22 DIAGNOSIS — I110 Hypertensive heart disease with heart failure: Secondary | ICD-10-CM | POA: Diagnosis not present

## 2017-06-22 DIAGNOSIS — J4540 Moderate persistent asthma, uncomplicated: Secondary | ICD-10-CM | POA: Diagnosis not present

## 2017-06-22 DIAGNOSIS — K7581 Nonalcoholic steatohepatitis (NASH): Secondary | ICD-10-CM | POA: Diagnosis not present

## 2017-06-22 DIAGNOSIS — Z794 Long term (current) use of insulin: Secondary | ICD-10-CM | POA: Diagnosis not present

## 2017-06-22 DIAGNOSIS — E785 Hyperlipidemia, unspecified: Secondary | ICD-10-CM | POA: Diagnosis not present

## 2017-06-22 DIAGNOSIS — M797 Fibromyalgia: Secondary | ICD-10-CM | POA: Diagnosis not present

## 2017-06-22 DIAGNOSIS — Z7951 Long term (current) use of inhaled steroids: Secondary | ICD-10-CM | POA: Diagnosis not present

## 2017-06-22 DIAGNOSIS — I5022 Chronic systolic (congestive) heart failure: Secondary | ICD-10-CM | POA: Diagnosis not present

## 2017-06-22 DIAGNOSIS — D649 Anemia, unspecified: Secondary | ICD-10-CM | POA: Diagnosis not present

## 2017-06-22 DIAGNOSIS — Z950 Presence of cardiac pacemaker: Secondary | ICD-10-CM | POA: Diagnosis not present

## 2017-06-22 DIAGNOSIS — M5136 Other intervertebral disc degeneration, lumbar region: Secondary | ICD-10-CM | POA: Diagnosis not present

## 2017-06-24 ENCOUNTER — Telehealth: Payer: Self-pay | Admitting: *Deleted

## 2017-06-24 DIAGNOSIS — Z794 Long term (current) use of insulin: Secondary | ICD-10-CM | POA: Diagnosis not present

## 2017-06-24 DIAGNOSIS — E119 Type 2 diabetes mellitus without complications: Secondary | ICD-10-CM | POA: Diagnosis not present

## 2017-06-24 DIAGNOSIS — I110 Hypertensive heart disease with heart failure: Secondary | ICD-10-CM | POA: Diagnosis not present

## 2017-06-24 DIAGNOSIS — M5136 Other intervertebral disc degeneration, lumbar region: Secondary | ICD-10-CM | POA: Diagnosis not present

## 2017-06-24 DIAGNOSIS — Z950 Presence of cardiac pacemaker: Secondary | ICD-10-CM | POA: Diagnosis not present

## 2017-06-24 DIAGNOSIS — J441 Chronic obstructive pulmonary disease with (acute) exacerbation: Secondary | ICD-10-CM | POA: Diagnosis not present

## 2017-06-24 DIAGNOSIS — I5022 Chronic systolic (congestive) heart failure: Secondary | ICD-10-CM | POA: Diagnosis not present

## 2017-06-24 DIAGNOSIS — K7581 Nonalcoholic steatohepatitis (NASH): Secondary | ICD-10-CM | POA: Diagnosis not present

## 2017-06-24 DIAGNOSIS — J4540 Moderate persistent asthma, uncomplicated: Secondary | ICD-10-CM | POA: Diagnosis not present

## 2017-06-24 DIAGNOSIS — E785 Hyperlipidemia, unspecified: Secondary | ICD-10-CM | POA: Diagnosis not present

## 2017-06-24 DIAGNOSIS — M797 Fibromyalgia: Secondary | ICD-10-CM | POA: Diagnosis not present

## 2017-06-24 DIAGNOSIS — Z7951 Long term (current) use of inhaled steroids: Secondary | ICD-10-CM | POA: Diagnosis not present

## 2017-06-24 DIAGNOSIS — D649 Anemia, unspecified: Secondary | ICD-10-CM | POA: Diagnosis not present

## 2017-06-24 NOTE — Telephone Encounter (Signed)
  Nakia from [facility] called and states an order needs to be sent to another home health agency for CNA to come out to help with light house duty and bathing. [facility] does not provide this type of service.

## 2017-06-26 ENCOUNTER — Other Ambulatory Visit: Payer: Self-pay | Admitting: Family Medicine

## 2017-06-30 ENCOUNTER — Ambulatory Visit (INDEPENDENT_AMBULATORY_CARE_PROVIDER_SITE_OTHER): Payer: Medicare Other | Admitting: *Deleted

## 2017-06-30 DIAGNOSIS — I442 Atrioventricular block, complete: Secondary | ICD-10-CM | POA: Diagnosis not present

## 2017-06-30 NOTE — Progress Notes (Signed)
Remote pacemaker transmission.   

## 2017-07-01 ENCOUNTER — Encounter: Payer: Self-pay | Admitting: Cardiology

## 2017-07-01 NOTE — Progress Notes (Signed)
Letter  

## 2017-07-03 DIAGNOSIS — J4540 Moderate persistent asthma, uncomplicated: Secondary | ICD-10-CM | POA: Diagnosis not present

## 2017-07-03 DIAGNOSIS — J441 Chronic obstructive pulmonary disease with (acute) exacerbation: Secondary | ICD-10-CM | POA: Diagnosis not present

## 2017-07-03 DIAGNOSIS — Z950 Presence of cardiac pacemaker: Secondary | ICD-10-CM | POA: Diagnosis not present

## 2017-07-03 DIAGNOSIS — M5136 Other intervertebral disc degeneration, lumbar region: Secondary | ICD-10-CM | POA: Diagnosis not present

## 2017-07-03 DIAGNOSIS — Z794 Long term (current) use of insulin: Secondary | ICD-10-CM | POA: Diagnosis not present

## 2017-07-03 DIAGNOSIS — Z7951 Long term (current) use of inhaled steroids: Secondary | ICD-10-CM | POA: Diagnosis not present

## 2017-07-03 DIAGNOSIS — I110 Hypertensive heart disease with heart failure: Secondary | ICD-10-CM | POA: Diagnosis not present

## 2017-07-03 DIAGNOSIS — K7581 Nonalcoholic steatohepatitis (NASH): Secondary | ICD-10-CM | POA: Diagnosis not present

## 2017-07-03 DIAGNOSIS — I5022 Chronic systolic (congestive) heart failure: Secondary | ICD-10-CM | POA: Diagnosis not present

## 2017-07-03 DIAGNOSIS — D649 Anemia, unspecified: Secondary | ICD-10-CM | POA: Diagnosis not present

## 2017-07-03 DIAGNOSIS — E785 Hyperlipidemia, unspecified: Secondary | ICD-10-CM | POA: Diagnosis not present

## 2017-07-03 DIAGNOSIS — M797 Fibromyalgia: Secondary | ICD-10-CM | POA: Diagnosis not present

## 2017-07-03 DIAGNOSIS — E119 Type 2 diabetes mellitus without complications: Secondary | ICD-10-CM | POA: Diagnosis not present

## 2017-07-06 LAB — CUP PACEART REMOTE DEVICE CHECK
Battery Remaining Longevity: 47 mo
Brady Statistic AP VS Percent: 0.27 %
Brady Statistic AS VS Percent: 2.04 %
Date Time Interrogation Session: 20181107160307
Implantable Lead Implant Date: 20030324
Implantable Lead Implant Date: 20030324
Implantable Lead Implant Date: 20030324
Implantable Lead Location: 753860
Implantable Lead Model: 4193
Implantable Pulse Generator Implant Date: 20151231
Lead Channel Impedance Value: 1083 Ohm
Lead Channel Impedance Value: 399 Ohm
Lead Channel Impedance Value: 4047 Ohm
Lead Channel Impedance Value: 893 Ohm
Lead Channel Pacing Threshold Amplitude: 0.875 V
Lead Channel Pacing Threshold Pulse Width: 0.4 ms
Lead Channel Sensing Intrinsic Amplitude: 6.625 mV
Lead Channel Setting Pacing Amplitude: 1.5 V
Lead Channel Setting Pacing Amplitude: 2.5 V
Lead Channel Setting Pacing Pulse Width: 0.8 ms
Lead Channel Setting Pacing Pulse Width: 0.8 ms
MDC IDC LEAD LOCATION: 753858
MDC IDC LEAD LOCATION: 753859
MDC IDC MSMT BATTERY VOLTAGE: 2.99 V
MDC IDC MSMT LEADCHNL LV IMPEDANCE VALUE: 4047 Ohm
MDC IDC MSMT LEADCHNL LV IMPEDANCE VALUE: 4047 Ohm
MDC IDC MSMT LEADCHNL RA IMPEDANCE VALUE: 380 Ohm
MDC IDC MSMT LEADCHNL RA IMPEDANCE VALUE: 418 Ohm
MDC IDC MSMT LEADCHNL RA SENSING INTR AMPL: 1.25 mV
MDC IDC MSMT LEADCHNL RA SENSING INTR AMPL: 1.25 mV
MDC IDC MSMT LEADCHNL RV IMPEDANCE VALUE: 551 Ohm
MDC IDC MSMT LEADCHNL RV PACING THRESHOLD AMPLITUDE: 2.125 V
MDC IDC MSMT LEADCHNL RV PACING THRESHOLD PULSEWIDTH: 0.4 ms
MDC IDC MSMT LEADCHNL RV SENSING INTR AMPL: 6.625 mV
MDC IDC SET LEADCHNL RV PACING AMPLITUDE: 3.5 V
MDC IDC SET LEADCHNL RV SENSING SENSITIVITY: 4 mV
MDC IDC STAT BRADY AP VP PERCENT: 38.75 %
MDC IDC STAT BRADY AS VP PERCENT: 58.95 %
MDC IDC STAT BRADY RA PERCENT PACED: 38.43 %
MDC IDC STAT BRADY RV PERCENT PACED: 96.73 %

## 2017-07-08 DIAGNOSIS — Z7951 Long term (current) use of inhaled steroids: Secondary | ICD-10-CM | POA: Diagnosis not present

## 2017-07-08 DIAGNOSIS — J4540 Moderate persistent asthma, uncomplicated: Secondary | ICD-10-CM | POA: Diagnosis not present

## 2017-07-08 DIAGNOSIS — M797 Fibromyalgia: Secondary | ICD-10-CM | POA: Diagnosis not present

## 2017-07-08 DIAGNOSIS — I5022 Chronic systolic (congestive) heart failure: Secondary | ICD-10-CM | POA: Diagnosis not present

## 2017-07-08 DIAGNOSIS — D649 Anemia, unspecified: Secondary | ICD-10-CM | POA: Diagnosis not present

## 2017-07-08 DIAGNOSIS — I110 Hypertensive heart disease with heart failure: Secondary | ICD-10-CM | POA: Diagnosis not present

## 2017-07-08 DIAGNOSIS — E785 Hyperlipidemia, unspecified: Secondary | ICD-10-CM | POA: Diagnosis not present

## 2017-07-08 DIAGNOSIS — Z794 Long term (current) use of insulin: Secondary | ICD-10-CM | POA: Diagnosis not present

## 2017-07-08 DIAGNOSIS — J441 Chronic obstructive pulmonary disease with (acute) exacerbation: Secondary | ICD-10-CM | POA: Diagnosis not present

## 2017-07-08 DIAGNOSIS — Z950 Presence of cardiac pacemaker: Secondary | ICD-10-CM | POA: Diagnosis not present

## 2017-07-08 DIAGNOSIS — E119 Type 2 diabetes mellitus without complications: Secondary | ICD-10-CM | POA: Diagnosis not present

## 2017-07-08 DIAGNOSIS — K7581 Nonalcoholic steatohepatitis (NASH): Secondary | ICD-10-CM | POA: Diagnosis not present

## 2017-07-08 DIAGNOSIS — M5136 Other intervertebral disc degeneration, lumbar region: Secondary | ICD-10-CM | POA: Diagnosis not present

## 2017-07-10 ENCOUNTER — Other Ambulatory Visit: Payer: Self-pay | Admitting: Family Medicine

## 2017-07-10 DIAGNOSIS — R05 Cough: Secondary | ICD-10-CM

## 2017-07-10 DIAGNOSIS — R059 Cough, unspecified: Secondary | ICD-10-CM

## 2017-07-14 DIAGNOSIS — I5022 Chronic systolic (congestive) heart failure: Secondary | ICD-10-CM | POA: Diagnosis not present

## 2017-07-14 DIAGNOSIS — M797 Fibromyalgia: Secondary | ICD-10-CM | POA: Diagnosis not present

## 2017-07-14 DIAGNOSIS — E785 Hyperlipidemia, unspecified: Secondary | ICD-10-CM | POA: Diagnosis not present

## 2017-07-14 DIAGNOSIS — Z950 Presence of cardiac pacemaker: Secondary | ICD-10-CM | POA: Diagnosis not present

## 2017-07-14 DIAGNOSIS — Z794 Long term (current) use of insulin: Secondary | ICD-10-CM | POA: Diagnosis not present

## 2017-07-14 DIAGNOSIS — Z7951 Long term (current) use of inhaled steroids: Secondary | ICD-10-CM | POA: Diagnosis not present

## 2017-07-14 DIAGNOSIS — K7581 Nonalcoholic steatohepatitis (NASH): Secondary | ICD-10-CM | POA: Diagnosis not present

## 2017-07-14 DIAGNOSIS — D649 Anemia, unspecified: Secondary | ICD-10-CM | POA: Diagnosis not present

## 2017-07-14 DIAGNOSIS — I110 Hypertensive heart disease with heart failure: Secondary | ICD-10-CM | POA: Diagnosis not present

## 2017-07-14 DIAGNOSIS — E119 Type 2 diabetes mellitus without complications: Secondary | ICD-10-CM | POA: Diagnosis not present

## 2017-07-14 DIAGNOSIS — J4540 Moderate persistent asthma, uncomplicated: Secondary | ICD-10-CM | POA: Diagnosis not present

## 2017-07-14 DIAGNOSIS — J441 Chronic obstructive pulmonary disease with (acute) exacerbation: Secondary | ICD-10-CM | POA: Diagnosis not present

## 2017-07-14 DIAGNOSIS — M5136 Other intervertebral disc degeneration, lumbar region: Secondary | ICD-10-CM | POA: Diagnosis not present

## 2017-07-17 ENCOUNTER — Other Ambulatory Visit: Payer: Self-pay | Admitting: Family Medicine

## 2017-07-26 ENCOUNTER — Telehealth: Payer: Self-pay | Admitting: Family Medicine

## 2017-07-26 DIAGNOSIS — Z780 Asymptomatic menopausal state: Secondary | ICD-10-CM

## 2017-07-26 NOTE — Telephone Encounter (Signed)
OK to order bone density test downstairs.  Diagnosis code postmenopausal.

## 2017-07-26 NOTE — Addendum Note (Signed)
Addended by: Doree Albee on: 07/26/2017 03:29 PM   Modules accepted: Orders

## 2017-07-26 NOTE — Telephone Encounter (Signed)
Pt called and would like a referral for Bone Density scan. Thanks

## 2017-07-26 NOTE — Telephone Encounter (Signed)
DEXA HAS BEEN ORDERED. Tanya Hogan,CMA

## 2017-08-04 ENCOUNTER — Other Ambulatory Visit: Payer: Self-pay | Admitting: Family Medicine

## 2017-08-04 ENCOUNTER — Other Ambulatory Visit: Payer: Medicare Other

## 2017-08-09 ENCOUNTER — Other Ambulatory Visit: Payer: Self-pay | Admitting: Family Medicine

## 2017-08-18 ENCOUNTER — Other Ambulatory Visit: Payer: Self-pay | Admitting: Family Medicine

## 2017-08-21 DIAGNOSIS — G4733 Obstructive sleep apnea (adult) (pediatric): Secondary | ICD-10-CM | POA: Diagnosis not present

## 2017-08-26 DIAGNOSIS — H608X9 Other otitis externa, unspecified ear: Secondary | ICD-10-CM | POA: Diagnosis not present

## 2017-08-26 DIAGNOSIS — J3489 Other specified disorders of nose and nasal sinuses: Secondary | ICD-10-CM | POA: Diagnosis not present

## 2017-08-27 ENCOUNTER — Ambulatory Visit (INDEPENDENT_AMBULATORY_CARE_PROVIDER_SITE_OTHER): Payer: Medicare Other | Admitting: Family Medicine

## 2017-08-27 ENCOUNTER — Encounter: Payer: Self-pay | Admitting: Family Medicine

## 2017-08-27 VITALS — BP 140/78 | HR 85 | Ht 61.0 in | Wt 224.0 lb

## 2017-08-27 DIAGNOSIS — R195 Other fecal abnormalities: Secondary | ICD-10-CM

## 2017-08-27 DIAGNOSIS — E118 Type 2 diabetes mellitus with unspecified complications: Secondary | ICD-10-CM

## 2017-08-27 DIAGNOSIS — D509 Iron deficiency anemia, unspecified: Secondary | ICD-10-CM | POA: Diagnosis not present

## 2017-08-27 DIAGNOSIS — J449 Chronic obstructive pulmonary disease, unspecified: Secondary | ICD-10-CM

## 2017-08-27 DIAGNOSIS — I10 Essential (primary) hypertension: Secondary | ICD-10-CM | POA: Diagnosis not present

## 2017-08-27 LAB — POCT UA - MICROALBUMIN
Albumin/Creatinine Ratio, Urine, POC: <30
CREATININE, POC: 200 mg/dL
MICROALBUMIN (UR) POC: 30 mg/L

## 2017-08-27 LAB — POCT GLYCOSYLATED HEMOGLOBIN (HGB A1C): Hemoglobin A1C: 7.4

## 2017-08-27 NOTE — Progress Notes (Addendum)
  Subjective:    CC:   HPI: Diabetes - no hypoglycemic events. No wounds or sores that are not healing well. No increased thirst or urination. Checking glucose at home. Taking medications as prescribed without any side effects.  She is been taking 30 units of Lantus which she increased a couple of weeks ago.  She says may be even 4 months she has been on that dose.  But she also admits she is been eating a lot of things out of the norm over the holidays.  Hypertension- Pt denies chest pain, SOB, dizziness, or heart palpitations.  Taking meds as directed w/o problems.  Denies medication side effects.    Iron deficiency anemia-repeat iron test in October looked great.  I did encourage her to take 1 more month of iron before discontinuing.  Had a persistent cough.  She is been working with the ENT on this.  They felt like some of it could be related to her anemia and they have also been doing this extra cleaning process on her CPAP to see if that will help as well.  No sign of acute heart failure.    Follow-up COPD-she is on Advair daily for her COPD.  We have Spiriva on her list we have not actually filled this medication for her in almost 3 years.  Follows with pulmonology.  She is also been passing mucus in her stools.  She thinks she ate some bad chicken before the holidays and says she had difficulty having a bowel movement for almost a week and then when she finally started to pass stools she is been passing mucus in between the stools.  Past medical history, Surgical history, Family history not pertinant except as noted below, Social history, Allergies, and medications have been entered into the medical record, reviewed, and corrections made.   Review of Systems: No fevers, chills, night sweats, weight loss, chest pain, or shortness of breath.   Objective:    General: Well Developed, well nourished, and in no acute distress.  Neuro: Alert and oriented x3, extra-ocular muscles intact,  sensation grossly intact.  HEENT: Normocephalic, atraumatic  Skin: Warm and dry, no rashes. Cardiac: Regular rate and rhythm, no murmurs rubs or gallops, no lower extremity edema.  Respiratory: Clear to auscultation bilaterally. Not using accessory muscles, speaking in full sentences.   Impression and Recommendations:    DM-uncontrolled today.  Hemoglobin A1c elevated at 7.4.  Will increase Lantus to 40 units.  Now that the holidays are over continue to work on healthy diet and regular exercise.  Urine microalbumin performed today.  Foot exam performed today.  Iron deficiency anemia.  She is now just on a daily multivitamin with extra iron.  We will give it another month and see if her iron level start to peter down or not or if they are about the same.  HTN -borderline today.  Repeat blood pressure was just a little bit better.  Will monitor carefully.  COPD - continu current regimen.  Follows with pulmonology.  Updated her medication list went ahead and removed the Spiriva because I did not see that on the pulmonologist medication list.  mucous stool-she has a GI doctor that she sees regularly.  I did encourage her to maybe try a probiotic but if not improving over the next week then I strongly encouraged her to get in touch with her GI specialist.

## 2017-08-27 NOTE — Patient Instructions (Signed)
Crease Lantus to 40 units a day. Get back on track with healthy diet.  Really reduce carbs including bread, rice, pasta in the diet and stick mainly to vegetables and lean meat.

## 2017-09-01 ENCOUNTER — Ambulatory Visit: Payer: Medicare Other | Admitting: Family Medicine

## 2017-09-01 DIAGNOSIS — K740 Hepatic fibrosis: Secondary | ICD-10-CM | POA: Diagnosis not present

## 2017-09-01 DIAGNOSIS — K7581 Nonalcoholic steatohepatitis (NASH): Secondary | ICD-10-CM | POA: Diagnosis not present

## 2017-09-01 DIAGNOSIS — D509 Iron deficiency anemia, unspecified: Secondary | ICD-10-CM | POA: Diagnosis not present

## 2017-09-06 ENCOUNTER — Other Ambulatory Visit: Payer: Self-pay | Admitting: Adult Health

## 2017-09-06 ENCOUNTER — Ambulatory Visit
Admission: RE | Admit: 2017-09-06 | Discharge: 2017-09-06 | Disposition: A | Discharge status: Home / Self Care | Payer: Medicare Other | Source: Ambulatory Visit | Attending: Adult Health | Admitting: Adult Health

## 2017-09-06 DIAGNOSIS — Z1231 Encounter for screening mammogram for malignant neoplasm of breast: Secondary | ICD-10-CM

## 2017-09-06 DIAGNOSIS — C50212 Malignant neoplasm of upper-inner quadrant of left female breast: Secondary | ICD-10-CM

## 2017-09-06 DIAGNOSIS — Z170 Estrogen receptor positive status [ER+]: Principal | ICD-10-CM

## 2017-09-08 ENCOUNTER — Ambulatory Visit (INDEPENDENT_AMBULATORY_CARE_PROVIDER_SITE_OTHER): Payer: Medicare Other

## 2017-09-08 ENCOUNTER — Telehealth: Payer: Self-pay | Admitting: Family Medicine

## 2017-09-08 DIAGNOSIS — Z780 Asymptomatic menopausal state: Secondary | ICD-10-CM

## 2017-09-08 DIAGNOSIS — M85852 Other specified disorders of bone density and structure, left thigh: Secondary | ICD-10-CM | POA: Diagnosis not present

## 2017-09-08 DIAGNOSIS — M79671 Pain in right foot: Secondary | ICD-10-CM

## 2017-09-08 DIAGNOSIS — M810 Age-related osteoporosis without current pathological fracture: Secondary | ICD-10-CM | POA: Diagnosis not present

## 2017-09-08 DIAGNOSIS — M79672 Pain in left foot: Secondary | ICD-10-CM

## 2017-09-08 DIAGNOSIS — E118 Type 2 diabetes mellitus with unspecified complications: Secondary | ICD-10-CM

## 2017-09-08 MED ORDER — AMBULATORY NON FORMULARY MEDICATION: 0 refills | Status: DC

## 2017-09-08 NOTE — Telephone Encounter (Signed)
Patient came in to ask if she could get a prescription for therapeutic shoes to help her manage her diabetes symptoms. Please advise.

## 2017-09-08 NOTE — Telephone Encounter (Signed)
Order printed. Will place up front.Tanya Hogan Given to [name] to call pt to p/u.Tanya Hogan [name]

## 2017-09-09 ENCOUNTER — Ambulatory Visit (INDEPENDENT_AMBULATORY_CARE_PROVIDER_SITE_OTHER): Payer: Medicare Other | Admitting: Sports Medicine

## 2017-09-09 DIAGNOSIS — M19011 Primary osteoarthritis, right shoulder: Secondary | ICD-10-CM | POA: Diagnosis not present

## 2017-09-09 NOTE — Assessment & Plan Note (Signed)
Right glenohumeral injection as above, previous injection was in February of last year. Rehab exercises given for the shoulder and neck, she is having some neck stiffness, likely due to spondylosis as well as scapular dyskinesia with her shoulder arthritis.

## 2017-09-09 NOTE — Progress Notes (Signed)
Subjective:    CC: Right shoulder pain  HPI: This is a pleasant 67 year old female, she has known right glenohumeral osteoarthritis, last injection was earlier the last year.  Unfortunately she starting to have a recurrence of pain in her right shoulder, loss of range of motion, moderate, persistent, localized at the joint lines without radiation.  She also has some tightness in her neck.  I reviewed the past medical history, family history, social history, surgical history, and allergies today and no changes were needed.  Please see the problem list section below in epic for further details.  Past Medical History: Past Medical History:  Diagnosis Date  . Alkaline phosphatase elevation   . Breast cancer ([place]) 2011  . Carpal tunnel syndrome   . CHF (congestive heart failure) ([place])   . Closed fracture of unspecified part of radius (alone)   . DDD (degenerative disc disease), lumbar   . Depression   . Fibromyalgia   . HTN (hypertension)   . Hypothyroidism   . IBS (irritable bowel syndrome)   . LBBB (left bundle branch block)   . Microcytic anemia 05/14/2017   Past Surgical History: Past Surgical History:  Procedure Laterality Date  . BREAST LUMPECTOMY Left 2011  . FOOT SURGERY    . KNEE SURGERY    . PACEMAKER GENERATOR CHANGE N/A 08/23/2014   Procedure: PACEMAKER GENERATOR CHANGE;  Surgeon: Evans Lance, MD;  Location: [facility] CATH LAB;  Service: Cardiovascular;  Laterality: N/A;  . PACEMAKER PLACEMENT  2009   [facility]  . RECTAL SURGERY  1976  . TUBAL LIGATION     Social History: Social History   Socioeconomic History  . Marital status: Divorced    Spouse name: Not on file  . Number of children: Not on file  . Years of education: Not on file  . Highest education level: Not on file  Social Needs  . Financial resource strain: Not on file  . Food insecurity - worry: Not on file  . Food insecurity - inability: Not on file  . Transportation needs - medical: Not on  file  . Transportation needs - non-medical: Not on file  Occupational History  . Not on file  Tobacco Use  . Smoking status: Former Smoker    Last attempt to quit: 08/24/1966    Years since quitting: 51.0  . Smokeless tobacco: Never Used  Substance and Sexual Activity  . Alcohol use: No  . Drug use: No  . Sexual activity: Not Currently  Other Topics Concern  . Not on file  Social History Narrative  . Not on file   Family History: Family History  Problem Relation Age of Onset  . Melanoma Mother   . Heart failure Unknown   . Breast cancer Unknown   . Hypertension Unknown    Allergies: Allergies  Allergen Reactions  . Penicillins Anaphylaxis  . Petrolatum-Zinc Oxide Rash  . Accupril [Quinapril Hcl] Other (See Comments)    Hyperkalemia  . Aspirin Other (See Comments)    Pt states "burns holes in stomach"  . Baking Soda-Fluoride [Sodium Fluoride] Itching  . Codeine Nausea And Vomiting  . Fluticasone Other (See Comments)    Nasal ulcer.    . Furosemide Other (See Comments)    Nose bleed  . Lactose Intolerance (Gi) Other (See Comments)    Increased calcium, sneezing, nasal issues  . Levocetirizine     Doesn't recall  . Losartan Potassium Itching  . Metformin And Related Diarrhea  . Nasonex [employer]  Furoate] Other (See Comments)    nosebleed  . Quinine     unknown  . Rifampin Other (See Comments)    DOES NOT REMEMBER THIS MEDICATION OR ALLERGY  . Clindamycin/Lincomycin Rash  . Iodinated Diagnostic Agents Itching and Rash    Pt states she has had ultrasound with dye but was fine  . Miralax [Polyethylene Glycol] Rash  . Sulfa Antibiotics Rash  . Sulfamethoxazole Rash  . Sulfonamide Derivatives Rash  . Wool Alcohol [Lanolin] Rash  . Zoledronic Acid Other (See Comments) and Rash    shaking   Medications: See med rec.  Review of Systems: No fevers, chills, night sweats, weight loss, chest pain, or shortness of breath.   Objective:    General: Well Developed,  well nourished, and in no acute distress.  Neuro: Alert and oriented x3, extra-ocular muscles intact, sensation grossly intact.  HEENT: Normocephalic, atraumatic, pupils equal round reactive to light, neck supple, no masses, no lymphadenopathy, thyroid nonpalpable.  Skin: Warm and dry, no rashes. Cardiac: Regular rate and rhythm, no murmurs rubs or gallops, no lower extremity edema.  Respiratory: Clear to auscultation bilaterally. Not using accessory muscles, speaking in full sentences. Right shoulder: Inspection reveals no abnormalities, atrophy or asymmetry. Palpation is normal with no tenderness over AC joint or bicipital groove. ROM is full in all planes. Rotator cuff strength normal throughout. No signs of impingement with negative Neer and Hawkin's tests, empty can. Positive Hawkins, Neer test, positive crank, negative clunk, and good stability. Normal scapular function observed. No painful arc and no drop arm sign. No apprehension sign  Procedure: Real-time Ultrasound Guided Injection of right glenohumeral joint Device: GE Logiq E  Verbal informed consent obtained.  Time-out conducted.  Noted no overlying erythema, induration, or other signs of local infection.  Skin prepped in a sterile fashion.  Local anesthesia: Topical Ethyl chloride.  With sterile technique and under real time ultrasound guidance: Using a 22-gauge spinal needle advanced into the glenohumeral joint from a posterior approach and injected 1 cc Kenalog 40, 2 cc lidocaine, 2 cc bupivacaine. Completed without difficulty  Pain immediately resolved suggesting accurate placement of the medication.  Advised to call if fevers/chills, erythema, induration, drainage, or persistent bleeding.  Images permanently stored and available for review in the ultrasound unit.  Impression: Technically successful ultrasound guided injection.  Impression and Recommendations:    Primary osteoarthritis of right shoulder Right  glenohumeral injection as above, previous injection was in February of last year. Rehab exercises given for the shoulder and neck, she is having some neck stiffness, likely due to spondylosis as well as scapular dyskinesia with her shoulder arthritis. ___________________________________________ [name]. [name], M.D., ABFM., CAQSM. Primary Care and [place] Instructor of [place] [employer] [facility] [employer] [facility]

## 2017-09-10 MED ORDER — ALENDRONATE SODIUM 70 MG PO TABS: 70.0000 mg | ORAL_TABLET | ORAL | 11 refills | Status: DC

## 2017-09-10 NOTE — Progress Notes (Unsigned)
boni

## 2017-09-20 ENCOUNTER — Telehealth: Payer: Self-pay | Admitting: Family Medicine

## 2017-09-20 NOTE — Telephone Encounter (Signed)
Pt called clinic today questioning if the Fosamax could cause muscle weakness? Pt has taken 2 doses so far, and now she said it's hard for her to walk due to leg weakness. Pt states this is not an emergency and can wait for a response from PCP tomorrow.

## 2017-09-20 NOTE — Telephone Encounter (Signed)
I did check side effects listed.  Muscle weakness is not listing.  Cramping can occur in <1% usually do to calcium  Shift  Is she taking calcium with Vitamin D daily?  If the symptoms continue after this next time then we can try stopping it  Do the symptoms last for a few hours or days after she takes her dose?

## 2017-09-20 NOTE — Telephone Encounter (Signed)
Pt advised. She is taking Calcium and Vit D. She reports the leg weakness now as "pain and instability." She states she takes some tylenol and this helps the pain. It will come back if she walks too much. Pt states she has had some left knee pain off and on since she had surgery years ago, but this pain is "different." This symptom is constant, not for a couple hours or days after dose.

## 2017-09-21 NOTE — Telephone Encounter (Signed)
Pt advised of recommendations. She voiced understanding and agreed.Marland KitchenMarland KitchenElouise Hogan, [place]

## 2017-09-21 NOTE — Telephone Encounter (Signed)
OK to stop the medication for now.  Call if symptoms haven't resolved in 2 weeks. If they don't resolve then may not be related to the medication.

## 2017-09-21 NOTE — Telephone Encounter (Signed)
Unable to leave a message, no voicemail.

## 2017-09-29 ENCOUNTER — Ambulatory Visit (INDEPENDENT_AMBULATORY_CARE_PROVIDER_SITE_OTHER): Payer: Medicare Other | Admitting: *Deleted

## 2017-09-29 DIAGNOSIS — I442 Atrioventricular block, complete: Secondary | ICD-10-CM | POA: Diagnosis not present

## 2017-09-29 NOTE — Progress Notes (Signed)
Remote pacemaker transmission.   

## 2017-09-30 ENCOUNTER — Encounter: Payer: Self-pay | Admitting: Cardiology

## 2017-09-30 ENCOUNTER — Encounter (INDEPENDENT_AMBULATORY_CARE_PROVIDER_SITE_OTHER): Payer: Self-pay

## 2017-09-30 ENCOUNTER — Ambulatory Visit: Payer: Medicare Other | Admitting: Internal Medicine

## 2017-09-30 ENCOUNTER — Encounter: Payer: Self-pay | Admitting: Internal Medicine

## 2017-09-30 ENCOUNTER — Encounter: Payer: Self-pay | Admitting: *Deleted

## 2017-09-30 VITALS — BP 132/80 | HR 70 | Ht 61.0 in | Wt 225.0 lb

## 2017-09-30 DIAGNOSIS — I5022 Chronic systolic (congestive) heart failure: Secondary | ICD-10-CM

## 2017-09-30 DIAGNOSIS — I442 Atrioventricular block, complete: Secondary | ICD-10-CM | POA: Diagnosis not present

## 2017-09-30 DIAGNOSIS — Z950 Presence of cardiac pacemaker: Secondary | ICD-10-CM | POA: Diagnosis not present

## 2017-09-30 NOTE — Patient Instructions (Signed)
Medication Instructions:  Your physician recommends that you continue on your current medications as directed. Please refer to the Current Medication list given to you today.  Labwork: None ordered.  Testing/Procedures: None ordered.  Follow-Up: Your physician wants you to follow-up in: one year with Dr. Lovena Le.   You will receive a reminder letter in the mail two months in advance. If you don't receive a letter, please call our office to schedule the follow-up appointment.   Remote monitoring is used to monitor your Pacemaker from home. This monitoring reduces the number of office visits required to check your device to one time per year. It allows [place] to keep an eye on the functioning of your device to ensure it is working properly. You are scheduled for a device check from home on 01/08/2018. You may send your transmission at any time that day. If you have a wireless device, the transmission will be sent automatically. After your physician reviews your transmission, you will receive a postcard with your next transmission date.   Any Other Special Instructions Will Be Listed Below (If Applicable).  If you need a refill on your cardiac medications before your next appointment, please call your pharmacy.

## 2017-09-30 NOTE — Progress Notes (Signed)
HPI Tanya Hogan returns today for followup of her BiV PPM. She is a morbidly obese 67 yo woman with a h/o chronic systolic heart failure, CHB, s/p BiV PPM insertion. She had a h/o fibromylagia but has improved recently. She denies peripheral edema. She has not been able to lose weight. She has class 3 CHF. She admits to some dietary indiscretion.  Allergies  Allergen Reactions  . Penicillins Anaphylaxis  . Petrolatum-Zinc Oxide Rash  . Accupril [Quinapril Hcl] Other (See Comments)    Hyperkalemia  . Aspirin Other (See Comments)    Pt states "burns holes in stomach"  . Baking Soda-Fluoride [Sodium Fluoride] Itching  . Codeine Nausea And Vomiting  . Fluticasone Other (See Comments)    Nasal ulcer.    . Furosemide Other (See Comments)    Nose bleed  . Lactose Intolerance (Gi) Other (See Comments)    Increased calcium, sneezing, nasal issues  . Levocetirizine     Doesn't recall  . Losartan Potassium Itching  . Metformin And Related Diarrhea  . Nasonex [Mometasone Furoate] Other (See Comments)    nosebleed  . Quinine     unknown  . Rifampin Other (See Comments)    DOES NOT REMEMBER THIS MEDICATION OR ALLERGY  . Clindamycin/Lincomycin Rash  . Iodinated Diagnostic Agents Itching and Rash    Pt states she has had ultrasound with dye but was fine  . Miralax [Polyethylene Glycol] Rash  . Sulfa Antibiotics Rash  . Sulfamethoxazole Rash  . Sulfonamide Derivatives Rash  . Wool Alcohol [Lanolin] Rash  . Zoledronic Acid Other (See Comments) and Rash    shaking     Current Outpatient Medications  Medication Sig Dispense Refill  . ACCU-CHEK FASTCLIX LANCETS MISC Test twice a day 204 each 11  . ACCU-CHEK SMARTVIEW test strip TEST TWO TIMES DAILY 200 each 3  . acetaminophen (TYLENOL) 325 MG tablet Take 650 mg by mouth every 6 (six) hours as needed (pain).     . ADVAIR DISKUS 500-50 MCG/DOSE AEPB   5  . albuterol (PROVENTIL HFA;VENTOLIN HFA) 108 (90 Base) MCG/ACT inhaler Inhale 2  puffs into the lungs every 4 (four) hours as needed for wheezing or shortness of breath.    [name] alendronate (FOSAMAX) 70 MG tablet Take 1 tablet (70 mg total) by mouth every 7 (seven) days. Take with a full glass of water on an empty stomach. 4 tablet 11  . AMBULATORY NON FORMULARY MEDICATION Medication Name: Needs tubing and suplies for her nebulizer machine. Dx COPD. [facility] 1 vial 0  . AMBULATORY NON FORMULARY MEDICATION Fastclix lancets Dx: E11.9 300 each 3  . AMBULATORY NON FORMULARY MEDICATION Medication Name:[employer] Footwear. Dx: E11.8 DM type 2; M79.671,M79.672 Bilateral foot pain. 1 each 0  . atorvastatin (LIPITOR) 20 MG tablet Take 1 tablet (20 mg total) by mouth daily. 90 tablet 3  . BD PEN NEEDLE NANO U/F 32G X 4 MM MISC Test two times daily 180 each 11  . Blood Glucose Monitoring Suppl (ACCU-CHEK NANO SMARTVIEW) w/Device KIT To be used to test blood sugars two times daily. Dx:E11.8 1 kit 0  . bumetanide (BUMEX) 1 MG tablet TAKE 1 TABLET BY MOUTH TWO  TIMES DAILY 180 tablet 1  . BYSTOLIC 10 MG tablet TAKE 1 TABLET BY MOUTH  DAILY 90 tablet 1  . dicyclomine (BENTYL) 20 MG tablet Take 10-20 mg by mouth every 8 (eight) hours as needed for spasms. Take 1/2-1 tablet by mouth every eights  hours as needed for cramps  11  . diltiazem (CARDIZEM) 120 MG tablet Take 1 tablet (120 mg total) by mouth daily. 90 tablet 3  . EPIPEN 2-PAK 0.3 MG/0.3ML SOAJ injection INJECT 1 PEN INTO MUSCLE OF LATERAL THIGH FOR SEVERE ALLERGIC REACTION. CALL 911 AFTER USING THIS MEDICATION.  2  . insulin glargine (LANTUS) 100 UNIT/ML injection Inject 0.25 mLs (25 Units total) into the skin at bedtime. 10 mL 1  . levalbuterol (XOPENEX) 1.25 MG/3ML nebulizer solution   2  . levothyroxine (SYNTHROID, LEVOTHROID) 75 MCG tablet TAKE 1 TABLET BY MOUTH  DAILY 90 tablet 1  . nystatin cream (MYCOSTATIN) Apply 1 application topically 2 (two) times daily. 60 g 1  . pantoprazole (PROTONIX) 40 MG tablet TAKE 1 TABLET BY  MOUTH  EVERY MORNING AS DIRECTED     No current facility-administered medications for this visit.      Past Medical History:  Diagnosis Date  . Alkaline phosphatase elevation   . Breast cancer ([place]) 2011  . Carpal tunnel syndrome   . CHF (congestive heart failure) ([place])   . Closed fracture of unspecified part of radius (alone)   . DDD (degenerative disc disease), lumbar   . Depression   . Fibromyalgia   . HTN (hypertension)   . Hypothyroidism   . IBS (irritable bowel syndrome)   . LBBB (left bundle branch block)   . Microcytic anemia 05/14/2017    ROS:   All systems reviewed and negative except as noted in the HPI.   Past Surgical History:  Procedure Laterality Date  . BREAST LUMPECTOMY Left 2011  . FOOT SURGERY    . KNEE SURGERY    . PACEMAKER GENERATOR CHANGE N/A 08/23/2014   Procedure: PACEMAKER GENERATOR CHANGE;  Surgeon: Evans Lance, MD;  [facility] C[facility] LAB;  Service: Cardiovascular;  Laterality: N/A;  . PACEMAKER PLACEMENT  2009   [facility]  . RECTAL SURGERY  1976  . TUBAL LIGATION       Family History  Problem Relation Age of Onset  . Melanoma Mother   . Heart failure Unknown   . Breast cancer Unknown   . Hypertension Unknown      Social History   Socioeconomic History  . Marital status: Divorced    Spouse name: Not on file  . Number of children: Not on file  . Years of education: Not on file  . Highest education level: Not on file  Social Needs  . Financial resource strain: Not on file  . Food insecurity - worry: Not on file  . Food insecurity - inability: Not on file  . Transportation needs - medical: Not on file  . Transportation needs - non-medical: Not on file  Occupational History  . Not on file  Tobacco Use  . Smoking status: Former Smoker    Last attempt to quit: 08/24/1966    Years since quitting: 51.1  . Smokeless tobacco: Never Used  Substance and Sexual Activity  . Alcohol use: No  . Drug use: No  . Sexual  activity: Not Currently  Other Topics Concern  . Not on file  Social History Narrative  . Not on file     BP 132/80   Pulse 70   Ht 5' 1"  (1.549 m)   Wt 225 lb (102.1 kg)   SpO2 98%   BMI 42.51 kg/m   Physical Exam:  stable appearing 67 yo obese woman, NAD HEENT: Unremarkable Neck:  Unable to assess JVD, no thyromegally  Lymphatics:  No adenopathy Back:  No CVA tenderness Lungs:  Clear with no wheezes or rales or rhonchi HEART:  Regular rate rhythm, no murmurs, no rubs, no clicks Abd:  soft, positive bowel sounds, no organomegally, no rebound, no guarding Ext:  2 plus pulses, no edema, no cyanosis, no clubbing Skin:  No rashes no nodules Neuro:  CN II through XII intact, motor grossly intact   DEVICE  Normal device function.  See PaceArt for details.   Assess/Plan: 1. Chronic systolic heart failure - her symptoms are class 2-3. She will continue her current meds. I have asked her to reduce her salt intake. 2. Obesity - she remains overweight. She will continue to try and eat less and burn more calories. 3. BiV PPM - her medtronic BiV PPM is working normally. Will recheck in several months. 4. HTN - her blood pressure is relatively well controlled. She is encouraged to lose weight.  Tanya Hogan.D.

## 2017-10-05 ENCOUNTER — Encounter: Payer: Self-pay | Admitting: Family Medicine

## 2017-10-05 ENCOUNTER — Ambulatory Visit (INDEPENDENT_AMBULATORY_CARE_PROVIDER_SITE_OTHER): Payer: Medicare Other | Admitting: Family Medicine

## 2017-10-05 VITALS — BP 131/62 | HR 69 | Ht 61.0 in | Wt 222.0 lb

## 2017-10-05 DIAGNOSIS — Z0000 Encounter for general adult medical examination without abnormal findings: Secondary | ICD-10-CM

## 2017-10-05 DIAGNOSIS — M19011 Primary osteoarthritis, right shoulder: Secondary | ICD-10-CM | POA: Diagnosis not present

## 2017-10-05 DIAGNOSIS — M545 Low back pain: Secondary | ICD-10-CM

## 2017-10-05 DIAGNOSIS — G8929 Other chronic pain: Secondary | ICD-10-CM

## 2017-10-05 MED ORDER — TRAMADOL HCL 50 MG PO TABS: 50.0000 mg | ORAL_TABLET | Freq: Two times a day (BID) | ORAL | 0 refills | Status: DC | PRN

## 2017-10-05 MED ORDER — GABAPENTIN 100 MG PO CAPS: 100.0000 mg | ORAL_CAPSULE | Freq: Every day | ORAL | 1 refills | Status: DC

## 2017-10-05 NOTE — Progress Notes (Signed)
Subjective:   Tanya Hogan is a 67 y.o. female who presents for Medicare Annual (Subsequent) preventive examination.  Review of Systems:  Comprehensive ROS is negative.        Objective:     Vitals: BP 131/62   Pulse 69   Ht 5' 1"  (1.549 m)   Wt 222 lb (100.7 kg)   SpO2 97%   BMI 41.95 kg/m   Body mass index is 41.95 kg/m.  Advanced Directives 10/05/2017 10/30/2014 08/23/2014 05/09/2014 11/20/2013  Does Patient Have a Medical Advance Directive? No No No No Patient does not have advance directive;Patient would like information  Would patient like information on creating a medical advance directive? No - Patient declined No - patient declined information No - patient declined information No - patient declined information Advance directive packet given    Tobacco Social History   Tobacco Use  Smoking Status Former Smoker  . Last attempt to quit: 08/24/1966  . Years since quitting: 51.1  Smokeless Tobacco Never Used     Counseling given: Not Answered   Clinical Intake:    Physical Exam  Constitutional: She is oriented to person, place, and time. She appears well-developed and well-nourished.  HENT:  Head: Normocephalic and atraumatic.  Right Ear: External ear normal.  Left Ear: External ear normal.  Nose: Nose normal.  Mouth/Throat: Oropharynx is clear and moist.  TMs and canals are clear.   Eyes: Conjunctivae and EOM are normal. Pupils are equal, round, and reactive to light.  Neck: Neck supple. No thyromegaly present.  Cardiovascular: Normal rate, regular rhythm and normal heart sounds.  Pulmonary/Chest: Effort normal and breath sounds normal. She has no wheezes.  Lymphadenopathy:    She has no cervical adenopathy.  Neurological: She is alert and oriented to person, place, and time.  Skin: Skin is warm and dry.  Psychiatric: She has a normal mood and affect.                      Past Medical History:  Diagnosis Date  . Alkaline phosphatase  elevation   . Breast cancer ([place]) 2011  . Carpal tunnel syndrome   . CHF (congestive heart failure) ([place])   . Closed fracture of unspecified part of radius (alone)   . DDD (degenerative disc disease), lumbar   . Depression   . Fibromyalgia   . HTN (hypertension)   . Hypothyroidism   . IBS (irritable bowel syndrome)   . LBBB (left bundle branch block)   . Microcytic anemia 05/14/2017   Past Surgical History:  Procedure Laterality Date  . BREAST LUMPECTOMY Left 2011  . FOOT SURGERY    . KNEE SURGERY    . PACEMAKER GENERATOR CHANGE N/A 08/23/2014   Procedure: PACEMAKER GENERATOR CHANGE;  Surgeon: Evans Lance, MD;  Location: [facility] CATH LAB;  Service: Cardiovascular;  Laterality: N/A;  . PACEMAKER PLACEMENT  2009   [place] [facility]  . RECTAL SURGERY  1976  . TUBAL LIGATION     Family History  Problem Relation Age of Onset  . Melanoma Mother   . Heart failure Unknown   . Breast cancer Unknown   . Hypertension Unknown    Social History   Socioeconomic History  . Marital status: Divorced    Spouse name: None  . Number of children: None  . Years of education: None  . Highest education level: None  Social Needs  . Financial resource strain: None  . Food insecurity - worry: None  .  Food insecurity - inability: None  . Transportation needs - medical: None  . Transportation needs - non-medical: None  Occupational History  . None  Tobacco Use  . Smoking status: Former Smoker    Last attempt to quit: 08/24/1966    Years since quitting: 51.1  . Smokeless tobacco: Never Used  Substance and Sexual Activity  . Alcohol use: No  . Drug use: No  . Sexual activity: Not Currently  Other Topics Concern  . None  Social History Narrative  . None    Outpatient Encounter Medications as of 10/05/2017  Medication Sig  . ACCU-CHEK FASTCLIX LANCETS MISC Test twice a day  . ACCU-CHEK SMARTVIEW test strip TEST TWO TIMES DAILY  . acetaminophen (TYLENOL) 325 MG tablet Take 650 mg by  mouth every 6 (six) hours as needed (pain).   . ADVAIR DISKUS 500-50 MCG/DOSE AEPB   . albuterol (PROVENTIL HFA;VENTOLIN HFA) 108 (90 Base) MCG/ACT inhaler Inhale 2 puffs into the lungs every 4 (four) hours as needed for wheezing or shortness of breath.  . AMBULATORY NON FORMULARY MEDICATION Medication Name: Needs tubing and suplies for her nebulizer machine. Dx COPD. [facility]  . AMBULATORY NON FORMULARY MEDICATION Fastclix lancets Dx: E11.9  . AMBULATORY NON FORMULARY MEDICATION Medication Name:[employer] Footwear. Dx: E11.8 DM type 2; M79.671,M79.672 Bilateral foot pain.  [name] atorvastatin (LIPITOR) 20 MG tablet Take 1 tablet (20 mg total) by mouth daily.  . BD PEN NEEDLE NANO U/F 32G X 4 MM MISC Test two times daily  . Blood Glucose Monitoring Suppl (ACCU-CHEK NANO SMARTVIEW) w/Device KIT To be used to test blood sugars two times daily. Dx:E11.8  . bumetanide (BUMEX) 1 MG tablet TAKE 1 TABLET BY MOUTH TWO  TIMES DAILY  . BYSTOLIC 10 MG tablet TAKE 1 TABLET BY MOUTH  DAILY  . dicyclomine (BENTYL) 20 MG tablet Take 10-20 mg by mouth every 8 (eight) hours as needed for spasms. Take 1/2-1 tablet by mouth every eights hours as needed for cramps  . diltiazem (CARDIZEM) 120 MG tablet Take 1 tablet (120 mg total) by mouth daily.  [name] EPIPEN 2-PAK 0.3 MG/0.3ML SOAJ injection INJECT 1 PEN INTO MUSCLE OF LATERAL THIGH FOR SEVERE ALLERGIC REACTION. CALL 911 AFTER USING THIS MEDICATION.  [name] gabapentin (NEURONTIN) 100 MG capsule Take 1-2 capsules (100-200 mg total) by mouth at bedtime.  . insulin glargine (LANTUS) 100 UNIT/ML injection Inject 0.25 mLs (25 Units total) into the skin at bedtime.  . levalbuterol (XOPENEX) 1.25 MG/3ML nebulizer solution   . levothyroxine (SYNTHROID, LEVOTHROID) 75 MCG tablet TAKE 1 TABLET BY MOUTH  DAILY  . nystatin cream (MYCOSTATIN) Apply 1 application topically 2 (two) times daily.  . pantoprazole (PROTONIX) 40 MG tablet TAKE 1 TABLET BY MOUTH  EVERY MORNING AS DIRECTED   . traMADol (ULTRAM) 50 MG tablet Take 1 tablet (50 mg total) by mouth every 12 (twelve) hours as needed.  [name] alendronate (FOSAMAX) 70 MG tablet Take 1 tablet (70 mg total) by mouth every 7 (seven) days. Take with a full glass of water on an empty stomach. (Patient not taking: Reported on 10/05/2017)   No facility-administered encounter medications on file as of 10/05/2017.     Activities of Daily Living In your present state of health, do you have any difficulty performing the following activities: 10/05/2017  Hearing? Y  Comment decline hearing testing at this time  Vision? N  Difficulty concentrating or making decisions? N  Walking or climbing stairs? Y  Comment knee osteoarthritis  Dressing or bathing? Y  Comment no hot water at home  Doing errands, shopping? Y  Some recent data might be hidden    Patient Care Team: Hali Marry, MD as PCP - General (Family Medicine) Marcy Panning, MD (Hematology and Oncology) Lelon Perla, MD as Attending Physician (Cardiology) Evans Lance, MD (Cardiology) Jacques Navy, MD (Neurology) Dr. Caprice Kluver (Otolaryngology) Amada Kingfisher, MD (Inactive) as Consulting Physician (Hematology and Oncology) Gerre Pebbles, MD as Referring Physician (Gastroenterology)    Assessment:   This is a routine wellness examination for Katisha.  Exercise Activities and Dietary recommendations Current Exercise Habits: The patient does not participate in regular exercise at present, Exercise limited by: orthopedic condition(s)  Goals    None      Fall Risk Fall Risk  08/27/2017 02/18/2017 01/15/2016 05/25/2014 05/09/2014  Falls in the past year? No No No - Yes  Number falls in past yr: - - - 2 or more 2 or more  Injury with Fall? - - - - -  Risk Factor Category  - - - - High Fall Risk  Risk for fall due to : - - - - History of fall(s)     Depression Screen PHQ 2/9 Scores 08/27/2017 02/18/2017 01/15/2016 11/04/2015  PHQ - 2 Score 0 0 0 0      Cognitive Function     6CIT Screen 10/05/2017  What Year? 0 points  What month? 0 points  What time? 0 points  Count back from 20 2 points  Months in reverse 2 points  Repeat phrase 6 points  Total Score 10    Immunization History  Administered Date(s) Administered  . Influenza Split 05/10/2012, 05/04/2014  . Influenza Whole 06/14/2009, 05/08/2010, 04/24/2011  . Influenza, High Dose Seasonal PF 05/17/2017  . Influenza,inj,Quad PF,6+ Mos 04/28/2013, 05/04/2014, 05/06/2015, 05/18/2016  . Pneumococcal Conjugate-13 05/25/2014  . Pneumococcal Polysaccharide-23 11/28/2012  . Td 08/24/2004, 03/07/2010  . Tdap 07/14/2012  . Zoster 04/24/2011  . Zoster Recombinat (Shingrix) 10/06/2017    Qualifies for Shingles Vaccine? Discussed Shingrix.   Screening Tests Health Maintenance  Topic Date Due  . PNA vac Low Risk Adult (2 of 2 - PPSV23) 11/28/2017  . HEMOGLOBIN A1C  02/24/2018  . OPHTHALMOLOGY EXAM  03/15/2018  . FOOT EXAM  08/27/2018  . URINE MICROALBUMIN  08/27/2018  . COLONOSCOPY  06/13/2019  . MAMMOGRAM  09/07/2019  . TETANUS/TDAP  07/14/2022  . INFLUENZA VACCINE  Completed  . DEXA SCAN  Completed  . Hepatitis C Screening  Completed    Cancer Screenings: Lung: Low Dose CT Chest recommended if Age 65-80 years, 30 pack-year currently smoking OR have quit w/in 15years. Patient does not qualify. Breast:  Up to date on Mammogram? Yes   Up to date of Bone Density/Dexa? Yes Colorectal: Up to date  Additional Screenings:  Hepatitis B/HIV/Syphillis: Hepatitis C Screening: Negative      Plan:    Medicare Wellness Exam    I have personally reviewed and noted the following in the patient's chart:   . Medical and social history . Use of alcohol, tobacco or illicit drugs - updated.   . Current medications and supplements - updated.   . Functional ability and status- updated. She is struggling and was unable to get personal care services after contacting her health  insurance.   . Nutritional status . Physical activity - updated. No regular exercise.  . Advanced directives  . List of other physicians - see care team.  .  Hospitalizations, surgeries, and ER visits in previous 12 months . Vitals . Screenings to include cognitive, depression, and falls . Referrals and appointments  In addition, I have reviewed and discussed with patient certain preventive protocols, quality metrics, and best practice recommendations. A written personalized care plan for preventive services as well as general preventive health recommendations were provided to patient.     Beatrice Lecher, MD  10/08/2017

## 2017-10-05 NOTE — Patient Instructions (Addendum)
We will retry the gabapentin at a lower dose and see if you feel like it is helpful.  Like to see you back in about 2 months so that we can adjust her dose and see how you are doing.  We can gradually increase it to twice a day if you are doing well on it.  You can always call me sooner if you would like. Please check with your insurance on the shingles vaccine and if they will cover it in the office or at your pharmacy. We can also try tramadol for pain.

## 2017-10-06 ENCOUNTER — Ambulatory Visit (INDEPENDENT_AMBULATORY_CARE_PROVIDER_SITE_OTHER): Payer: Medicare Other | Admitting: Sports Medicine

## 2017-10-06 ENCOUNTER — Encounter: Payer: Self-pay | Admitting: Sports Medicine

## 2017-10-06 VITALS — BP 103/64 | HR 71 | Resp 16 | Wt 222.0 lb

## 2017-10-06 DIAGNOSIS — M19011 Primary osteoarthritis, right shoulder: Secondary | ICD-10-CM | POA: Diagnosis not present

## 2017-10-06 DIAGNOSIS — Z23 Encounter for immunization: Secondary | ICD-10-CM | POA: Diagnosis not present

## 2017-10-06 NOTE — Assessment & Plan Note (Signed)
Fantastic response to right glenohumeral injection. Adding rehab exercises, return as needed.

## 2017-10-06 NOTE — Addendum Note (Signed)
Addended by: Elizabeth Sauer on: 10/06/2017 11:27 AM   Modules accepted: Orders

## 2017-10-06 NOTE — Progress Notes (Signed)
Subjective:    CC: Recheck shoulder  HPI: Primary osteoarthritis of right shoulder: Resolved after glenohumeral injection at the last visit, her neck pain is also improved after physical therapy, eager to try some rehab exercises for her shoulder as well, she has a bit of clicking.  She also needs a Shingrix vaccine.  I reviewed the past medical history, family history, social history, surgical history, and allergies today and no changes were needed.  Please see the problem list section below in epic for further details.  Past Medical History: Past Medical History:  Diagnosis Date  . Alkaline phosphatase elevation   . Breast cancer ([place]) 2011  . Carpal tunnel syndrome   . CHF (congestive heart failure) ([place])   . Closed fracture of unspecified part of radius (alone)   . DDD (degenerative disc disease), lumbar   . Depression   . Fibromyalgia   . HTN (hypertension)   . Hypothyroidism   . IBS (irritable bowel syndrome)   . LBBB (left bundle branch block)   . Microcytic anemia 05/14/2017   Past Surgical History: Past Surgical History:  Procedure Laterality Date  . BREAST LUMPECTOMY Left 2011  . FOOT SURGERY    . KNEE SURGERY    . PACEMAKER GENERATOR CHANGE N/A 08/23/2014   Procedure: PACEMAKER GENERATOR CHANGE;  Surgeon: Evans Lance, MD;  [facility];  Service: Cardiovascular;  Laterality: N/A;  . PACEMAKER PLACEMENT  2009   [place]  . RECTAL SURGERY  1976  . TUBAL LIGATION     Social History: Social History   Socioeconomic History  . Marital status: Divorced    Spouse name: None  . Number of children: None  . Years of education: None  . Highest education level: None  Social Needs  . Financial resource strain: None  . Food insecurity - worry: None  . Food insecurity - inability: None  . Transportation needs - medical: None  . Transportation needs - non-medical: None  Occupational History  . None  Tobacco Use  . Smoking status: Former  Smoker    Last attempt to quit: 08/24/1966    Years since quitting: 51.1  . Smokeless tobacco: Never Used  Substance and Sexual Activity  . Alcohol use: No  . Drug use: No  . Sexual activity: Not Currently  Other Topics Concern  . None  Social History Narrative  . None   Family History: Family History  Problem Relation Age of Onset  . Melanoma Mother   . Heart failure Unknown   . Breast cancer Unknown   . Hypertension Unknown    Allergies: Allergies  Allergen Reactions  . Penicillins Anaphylaxis  . Petrolatum-Zinc Oxide Rash  . Accupril [Quinapril Hcl] Other (See Comments)    Hyperkalemia  . Aspirin Other (See Comments)    Pt states "burns holes in stomach"  . Baking Soda-Fluoride [Sodium Fluoride] Itching  . Codeine Nausea And Vomiting  . Fluticasone Other (See Comments)    Nasal ulcer.    . Furosemide Other (See Comments)    Nose bleed  . Lactose Intolerance (Gi) Other (See Comments)    Increased calcium, sneezing, nasal issues  . Levocetirizine     Doesn't recall  . Losartan Potassium Itching  . Metformin And Related Diarrhea  . Nasonex [Mometasone Furoate] Other (See Comments)    nosebleed  . Quinine     unknown  . Rifampin Other (See Comments)    DOES NOT REMEMBER THIS MEDICATION OR ALLERGY  . Clindamycin/Lincomycin  Rash  . Iodinated Diagnostic Agents Itching and Rash    Pt states she has had ultrasound with dye but was fine  . Miralax [Polyethylene Glycol] Rash  . Sulfa Antibiotics Rash  . Sulfamethoxazole Rash  . Sulfonamide Derivatives Rash  . Wool Alcohol [Lanolin] Rash  . Zoledronic Acid Other (See Comments) and Rash    shaking   Medications: See med rec.  Review of Systems: No fevers, chills, night sweats, weight loss, chest pain, or shortness of breath.   Objective:    General: Well Developed, well nourished, and in no acute distress.  Neuro: Alert and oriented x3, extra-ocular muscles intact, sensation grossly intact.  HEENT:  Normocephalic, atraumatic, pupils equal round reactive to light, neck supple, no masses, no lymphadenopathy, thyroid nonpalpable.  Skin: Warm and dry, no rashes. Cardiac: Regular rate and rhythm, no murmurs rubs or gallops, no lower extremity edema.  Respiratory: Clear to auscultation bilaterally. Not using accessory muscles, speaking in full sentences.  Impression and Recommendations:    Primary osteoarthritis of right shoulder Fantastic response to right glenohumeral injection. Adding rehab exercises, return as needed.  I spent 25 minutes with this patient, greater than 50% was face-to-face time counseling regarding the above diagnoses ___________________________________________ [name]. [name], M.D., ABFM., CAQSM. Primary Care and [place] Instructor of [place] [employer] [facility] [employer]

## 2017-10-07 ENCOUNTER — Ambulatory Visit: Payer: Medicare Other | Admitting: Sports Medicine

## 2017-10-08 NOTE — Progress Notes (Signed)
   Subjective:    Patient ID: Tanya Hogan, female    DOB: 18-Sep-1950, 67 y.o.   MRN: [id number]  HPI She also wanted to discuss a couple of other concerns today.  She would like to consider retrying the gabapentin for her pain.  She tried it at a high dose previously and it was overly sedating.  She is particularly concerned about the pain in her right shoulder back and neck and foot pain.  He has a history of chronic back pain.  She says she cannot even finish washing 1 load of dishes without having to sit down and rest in between.  She is also trying to help take care of her brother   Review of Systems     Objective:   Physical Exam  Constitutional: She is oriented to person, place, and time. She appears well-developed and well-nourished.  HENT:  Head: Normocephalic and atraumatic.  Eyes: Conjunctivae and EOM are normal.  Cardiovascular: Normal rate.  Pulmonary/Chest: Effort normal.  Neurological: She is alert and oriented to person, place, and time.  Skin: Skin is dry. No pallor.  Psychiatric: She has a normal mood and affect. Her behavior is normal.  Vitals reviewed.         Assessment & Plan:  Chronic back and shoulder pain-we will restart a low dose of gabapentin at bedtime.  Also given a prescription for tramadol to use as needed during the daytime.  She feels like her pain really keeps her from being able to complete her ADLs.

## 2017-10-11 ENCOUNTER — Telehealth: Payer: Self-pay

## 2017-10-11 NOTE — Telephone Encounter (Signed)
I agree

## 2017-10-11 NOTE — Telephone Encounter (Signed)
Tanya Hogan reports anxiety after taking Tramadol. She wanted to know how to stop taking the medication. I advised her she could just stop taking the Tramadol. She wanted to know if there is anything else she can take.

## 2017-10-11 NOTE — Telephone Encounter (Signed)
She would like to know if there is anything else she can take for the pain.

## 2017-10-12 ENCOUNTER — Ambulatory Visit (INDEPENDENT_AMBULATORY_CARE_PROVIDER_SITE_OTHER): Payer: Medicare Other | Admitting: Family Medicine

## 2017-10-12 ENCOUNTER — Encounter: Payer: Self-pay | Admitting: Family Medicine

## 2017-10-12 VITALS — BP 119/69 | HR 72 | Ht 61.0 in | Wt 222.0 lb

## 2017-10-12 DIAGNOSIS — G8929 Other chronic pain: Secondary | ICD-10-CM

## 2017-10-12 DIAGNOSIS — T50905A Adverse effect of unspecified drugs, medicaments and biological substances, initial encounter: Secondary | ICD-10-CM

## 2017-10-12 DIAGNOSIS — M545 Low back pain: Secondary | ICD-10-CM

## 2017-10-12 NOTE — Patient Instructions (Signed)
Try to keep track of how often you take the 2nd gabapentin.

## 2017-10-12 NOTE — Progress Notes (Signed)
Subjective:    Patient ID: Tanya Hogan, female    DOB: 04-Jul-1951, 67 y.o.   MRN: [id number]  HPI   15 you female is here to discuss a medication side effect - tramadol caused her to feel shaky/jittery, she broke out in a rash on the R side of her neck, face and a spot on R hand. she had headache, dizziness,drowsiness, anxious,dry mouth, hives, and uncertian if she experienced gut or back pain. she took her last dose at 7 AM yesterday.  She still feels a little bit off today but overall feeling better.  She has been taking the gabapentin 100 mg at bedtime and then a couple nights has woken up and taken a second tab.  She just wants to be very cautious about it because when she was on it previously she was on very high doses and just felt out of it.  She did see Dr. [name] and received an injection in her right shoulder.  She says it did help for about 2 weeks but now starting to hurt again.  Not nearly to the previous intensity with the burning and aching but it is starting to hurt again.    Review of Systems  BP 119/69   Pulse 72   Ht 5' 1"  (1.549 m)   Wt 222 lb (100.7 kg)   SpO2 100%   BMI 41.95 kg/m     Allergies  Allergen Reactions  . Penicillins Anaphylaxis  . Petrolatum-Zinc Oxide Rash  . Accupril [Quinapril Hcl] Other (See Comments)    Hyperkalemia  . Aspirin Other (See Comments)    Pt states "burns holes in stomach"  . Baking Soda-Fluoride [Sodium Fluoride] Itching  . Codeine Nausea And Vomiting  . Fluticasone Other (See Comments)    Nasal ulcer.    . Furosemide Other (See Comments)    Nose bleed  . Lactose Intolerance (Gi) Other (See Comments)    Increased calcium, sneezing, nasal issues  . Levocetirizine     Doesn't recall  . Losartan Potassium Itching  . Metformin And Related Diarrhea  . Nasonex [Mometasone Furoate] Other (See Comments)    nosebleed  . Quinine     unknown  . Rifampin Other (See Comments)    DOES NOT REMEMBER THIS MEDICATION OR  ALLERGY  . Clindamycin/Lincomycin Rash  . Iodinated Diagnostic Agents Itching and Rash    Pt states she has had ultrasound with dye but was fine  . Miralax [Polyethylene Glycol] Rash  . Sulfa Antibiotics Rash  . Sulfamethoxazole Rash  . Sulfonamide Derivatives Rash  . Tramadol Rash    Anxiety  . Wool Alcohol [Lanolin] Rash  . Zoledronic Acid Other (See Comments) and Rash    shaking    Past Medical History:  Diagnosis Date  . Alkaline phosphatase elevation   . Breast cancer ([place]) 2011  . Carpal tunnel syndrome   . CHF (congestive heart failure) ([place])   . Closed fracture of unspecified part of radius (alone)   . DDD (degenerative disc disease), lumbar   . Depression   . Fibromyalgia   . HTN (hypertension)   . Hypothyroidism   . IBS (irritable bowel syndrome)   . LBBB (left bundle branch block)   . Microcytic anemia 05/14/2017    Past Surgical History:  Procedure Laterality Date  . BREAST LUMPECTOMY Left 2011  . FOOT SURGERY    . KNEE SURGERY    . PACEMAKER GENERATOR CHANGE N/A 08/23/2014   Procedure: PACEMAKER GENERATOR CHANGE;  Surgeon: Evans Lance, MD;  Location: [facility] CATH LAB;  Service: Cardiovascular;  Laterality: N/A;  . PACEMAKER PLACEMENT  2009   [facility]  . RECTAL SURGERY  1976  . TUBAL LIGATION      Social History   Socioeconomic History  . Marital status: Divorced    Spouse name: Not on file  . Number of children: Not on file  . Years of education: Not on file  . Highest education level: Not on file  Social Needs  . Financial resource strain: Not on file  . Food insecurity - worry: Not on file  . Food insecurity - inability: Not on file  . Transportation needs - medical: Not on file  . Transportation needs - non-medical: Not on file  Occupational History  . Not on file  Tobacco Use  . Smoking status: Former Smoker    Last attempt to quit: 08/24/1966    Years since quitting: 51.1  . Smokeless tobacco: Never Used  Substance and Sexual  Activity  . Alcohol use: No  . Drug use: No  . Sexual activity: Not Currently  Other Topics Concern  . Not on file  Social History Narrative  . Not on file    Family History  Problem Relation Age of Onset  . Melanoma Mother   . Heart failure Unknown   . Breast cancer Unknown   . Hypertension Unknown     Outpatient Encounter Medications as of 10/12/2017  Medication Sig  . ACCU-CHEK FASTCLIX LANCETS MISC Test twice a day  . ACCU-CHEK SMARTVIEW test strip TEST TWO TIMES DAILY  . acetaminophen (TYLENOL) 325 MG tablet Take 650 mg by mouth every 6 (six) hours as needed (pain).   . ADVAIR DISKUS 500-50 MCG/DOSE AEPB   . albuterol (PROVENTIL HFA;VENTOLIN HFA) 108 (90 Base) MCG/ACT inhaler Inhale 2 puffs into the lungs every 4 (four) hours as needed for wheezing or shortness of breath.  [name] alendronate (FOSAMAX) 70 MG tablet Take 1 tablet (70 mg total) by mouth every 7 (seven) days. Take with a full glass of water on an empty stomach. (Patient not taking: Reported on 10/05/2017)  . AMBULATORY NON FORMULARY MEDICATION Medication Name: Needs tubing and suplies for her nebulizer machine. Dx COPD. [facility]  . AMBULATORY NON FORMULARY MEDICATION Fastclix lancets Dx: E11.9  . AMBULATORY NON FORMULARY MEDICATION Medication Name:[employer]wear. Dx: E11.8 DM type 2; M79.671,M79.672 Bilateral foot pain.  [name] atorvastatin (LIPITOR) 20 MG tablet Take 1 tablet (20 mg total) by mouth daily.  . BD PEN NEEDLE NANO U/F 32G X 4 MM MISC Test two times daily  . Blood Glucose Monitoring Suppl (ACCU-CHEK NANO SMARTVIEW) w/Device KIT To be used to test blood sugars two times daily. Dx:E11.8  . bumetanide (BUMEX) 1 MG tablet TAKE 1 TABLET BY MOUTH TWO  TIMES DAILY  . BYSTOLIC 10 MG tablet TAKE 1 TABLET BY MOUTH  DAILY  . dicyclomine (BENTYL) 20 MG tablet Take 10-20 mg by mouth every 8 (eight) hours as needed for spasms. Take 1/2-1 tablet by mouth every eights hours as needed for cramps  . diltiazem  (CARDIZEM) 120 MG tablet Take 1 tablet (120 mg total) by mouth daily.  [name] EPIPEN 2-PAK 0.3 MG/0.3ML SOAJ injection INJECT 1 PEN INTO MUSCLE OF LATERAL THIGH FOR SEVERE ALLERGIC REACTION. CALL 911 AFTER USING THIS MEDICATION.  [name] gabapentin (NEURONTIN) 100 MG capsule Take 1-2 capsules (100-200 mg total) by mouth at bedtime.  . insulin glargine (LANTUS) 100 UNIT/ML injection Inject  0.25 mLs (25 Units total) into the skin at bedtime.  . levalbuterol (XOPENEX) 1.25 MG/3ML nebulizer solution   . levothyroxine (SYNTHROID, LEVOTHROID) 75 MCG tablet TAKE 1 TABLET BY MOUTH  DAILY  . nystatin cream (MYCOSTATIN) Apply 1 application topically 2 (two) times daily.  . pantoprazole (PROTONIX) 40 MG tablet TAKE 1 TABLET BY MOUTH  EVERY MORNING AS DIRECTED   No facility-administered encounter medications on file as of 10/12/2017.          Objective:   Physical Exam  Constitutional: She is oriented to person, place, and time. She appears well-developed and well-nourished.  HENT:  Head: Normocephalic and atraumatic.  Eyes: Conjunctivae and EOM are normal.  Cardiovascular: Normal rate.  Pulmonary/Chest: Effort normal.  Neurological: She is alert and oriented to person, place, and time.  Skin: Skin is dry. No pallor.  Is an excoriated lesion on her right facial cheek and on the dorsum of the right hand.  Psychiatric: She has a normal mood and affect. Her behavior is normal.  Vitals reviewed.       Assessment & Plan:  Medication side effect-tramadol added to intolerance list. She had a rash that was mostly resolved on exam today. She had an excoritated bump on her right facial cheek and dorsum of the right hand.  Chronic low back pain-tramadol added to medication intolerance list.  We discussed options.  For now she just wants to stick with the gabapentin so we will continue with 100 mg at bedtime.  She can keep track of how often she is taking a second dose in the middle the night.  After 1 more week on  the medication, which is a full 2 weeks, then she can try increasing to 200 mg nightly and see how she does.  In the meantime she can stick with Tylenol during the day.

## 2017-10-12 NOTE — Telephone Encounter (Signed)
I called to advise patient. She stated she has made an appointment to discuss in detail. She seemed irritated.

## 2017-10-12 NOTE — Telephone Encounter (Signed)
Added to intolerance list.  She able to restart the gabapentin.  Has she been doing okay on the? We could try cymbalta again. I know she was on it for about 5 years until we stopped it  A year ago. FDA approved for chronic MSK pain for her pain.

## 2017-10-21 ENCOUNTER — Encounter: Payer: Self-pay | Admitting: Family Medicine

## 2017-10-21 ENCOUNTER — Telehealth: Payer: Self-pay | Admitting: Family Medicine

## 2017-10-21 LAB — CUP PACEART REMOTE DEVICE CHECK
Battery Voltage: 2.99 V
Date Time Interrogation Session: 20190206135050
Implantable Lead Implant Date: 20030324
Implantable Lead Implant Date: 20030324
Implantable Lead Location: 753858
Implantable Lead Location: 753859
Implantable Lead Location: 753860
Implantable Lead Model: 4068
Lead Channel Impedance Value: 1064 Ohm
Lead Channel Impedance Value: 399 Ohm
Lead Channel Impedance Value: 4047 Ohm
Lead Channel Impedance Value: 437 Ohm
Lead Channel Impedance Value: 570 Ohm
Lead Channel Sensing Intrinsic Amplitude: 1.625 mV
Lead Channel Sensing Intrinsic Amplitude: 6.75 mV
Lead Channel Setting Pacing Amplitude: 2.5 V
Lead Channel Setting Pacing Amplitude: 3.5 V
MDC IDC LEAD IMPLANT DT: 20030324
MDC IDC MSMT BATTERY REMAINING LONGEVITY: 49 mo
MDC IDC MSMT LEADCHNL LV IMPEDANCE VALUE: 1235 Ohm
MDC IDC MSMT LEADCHNL LV IMPEDANCE VALUE: 4047 Ohm
MDC IDC MSMT LEADCHNL LV IMPEDANCE VALUE: 4047 Ohm
MDC IDC MSMT LEADCHNL RA IMPEDANCE VALUE: 456 Ohm
MDC IDC MSMT LEADCHNL RA PACING THRESHOLD AMPLITUDE: 0.875 V
MDC IDC MSMT LEADCHNL RA PACING THRESHOLD PULSEWIDTH: 0.4 ms
MDC IDC MSMT LEADCHNL RA SENSING INTR AMPL: 1.625 mV
MDC IDC MSMT LEADCHNL RV PACING THRESHOLD AMPLITUDE: 1.75 V
MDC IDC MSMT LEADCHNL RV PACING THRESHOLD PULSEWIDTH: 0.4 ms
MDC IDC MSMT LEADCHNL RV SENSING INTR AMPL: 6.75 mV
MDC IDC PG IMPLANT DT: 20151231
MDC IDC SET LEADCHNL LV PACING PULSEWIDTH: 0.8 ms
MDC IDC SET LEADCHNL RA PACING AMPLITUDE: 1.5 V
MDC IDC SET LEADCHNL RV PACING PULSEWIDTH: 0.8 ms
MDC IDC SET LEADCHNL RV SENSING SENSITIVITY: 4 mV
MDC IDC STAT BRADY AP VP PERCENT: 48.61 %
MDC IDC STAT BRADY AP VS PERCENT: 0.5 %
MDC IDC STAT BRADY AS VP PERCENT: 49.17 %
MDC IDC STAT BRADY AS VS PERCENT: 1.72 %
MDC IDC STAT BRADY RA PERCENT PACED: 48.57 %
MDC IDC STAT BRADY RV PERCENT PACED: 97.07 %

## 2017-10-21 NOTE — Telephone Encounter (Signed)
Pt report she had an annual phone check for her insurance yesterday and they raised question about her Lipitor and Cardizem can cause muscle pain when taken together. Pt does complain of muscle pain. Requesting the Lipitor be changed.

## 2017-10-22 ENCOUNTER — Encounter: Payer: Self-pay | Admitting: Family Medicine

## 2017-10-22 MED ORDER — SIMVASTATIN 20 MG PO TABS: 20.0000 mg | ORAL_TABLET | Freq: Every day | ORAL | 3 refills | Status: DC

## 2017-10-22 NOTE — Telephone Encounter (Signed)
OK, new rx sent for simvastatin to maile order

## 2017-10-22 NOTE — Telephone Encounter (Signed)
Pt advised.

## 2017-10-22 NOTE — Telephone Encounter (Signed)
Please have her call her cardiologist and see what they recommend.  Even though there is a slight increased risk they may opt to keep her on both but she can certainly call them and see if they recommend making a change.

## 2017-10-22 NOTE — Telephone Encounter (Signed)
Spoke with Pt, wants to stay on the same BP Rx but she has stopped the Lipitor. Requesting something different. She has not taken it in 3 days and her muscle pain is better. Routing.

## 2017-10-27 ENCOUNTER — Telehealth: Payer: Self-pay | Admitting: Family Medicine

## 2017-10-27 MED ORDER — PRAVASTATIN SODIUM 40 MG PO TABS: 40.0000 mg | ORAL_TABLET | Freq: Every day | ORAL | 3 refills | Status: DC

## 2017-10-27 NOTE — Telephone Encounter (Signed)
Received note from the pharmacy that the simvastatin interacts with his Cardizem.  Number prescription and sent for pravastatin.

## 2017-10-29 ENCOUNTER — Encounter: Payer: Medicare Other | Admitting: Adult Health

## 2017-11-02 DIAGNOSIS — R001 Bradycardia, unspecified: Secondary | ICD-10-CM | POA: Diagnosis not present

## 2017-11-02 DIAGNOSIS — I5022 Chronic systolic (congestive) heart failure: Secondary | ICD-10-CM | POA: Diagnosis not present

## 2017-11-02 DIAGNOSIS — Z950 Presence of cardiac pacemaker: Secondary | ICD-10-CM | POA: Diagnosis not present

## 2017-11-02 DIAGNOSIS — R42 Dizziness and giddiness: Secondary | ICD-10-CM | POA: Insufficient documentation

## 2017-11-02 DIAGNOSIS — J4540 Moderate persistent asthma, uncomplicated: Secondary | ICD-10-CM | POA: Diagnosis not present

## 2017-11-02 DIAGNOSIS — Z87891 Personal history of nicotine dependence: Secondary | ICD-10-CM | POA: Diagnosis not present

## 2017-11-02 DIAGNOSIS — G4733 Obstructive sleep apnea (adult) (pediatric): Secondary | ICD-10-CM | POA: Diagnosis not present

## 2017-11-02 DIAGNOSIS — Z7951 Long term (current) use of inhaled steroids: Secondary | ICD-10-CM | POA: Diagnosis not present

## 2017-11-02 DIAGNOSIS — Z79899 Other long term (current) drug therapy: Secondary | ICD-10-CM | POA: Diagnosis not present

## 2017-11-15 DIAGNOSIS — J449 Chronic obstructive pulmonary disease, unspecified: Secondary | ICD-10-CM | POA: Diagnosis not present

## 2017-11-15 DIAGNOSIS — R0902 Hypoxemia: Secondary | ICD-10-CM | POA: Diagnosis not present

## 2017-11-19 ENCOUNTER — Encounter: Payer: Self-pay | Admitting: Sports Medicine

## 2017-11-19 ENCOUNTER — Ambulatory Visit (INDEPENDENT_AMBULATORY_CARE_PROVIDER_SITE_OTHER): Payer: Medicare Other

## 2017-11-19 ENCOUNTER — Ambulatory Visit (INDEPENDENT_AMBULATORY_CARE_PROVIDER_SITE_OTHER): Payer: Medicare Other | Admitting: Sports Medicine

## 2017-11-19 DIAGNOSIS — M542 Cervicalgia: Secondary | ICD-10-CM

## 2017-11-19 DIAGNOSIS — M25511 Pain in right shoulder: Secondary | ICD-10-CM | POA: Diagnosis not present

## 2017-11-19 DIAGNOSIS — M5030 Other cervical disc degeneration, unspecified cervical region: Secondary | ICD-10-CM | POA: Insufficient documentation

## 2017-11-19 DIAGNOSIS — M19011 Primary osteoarthritis, right shoulder: Secondary | ICD-10-CM

## 2017-11-19 DIAGNOSIS — G4733 Obstructive sleep apnea (adult) (pediatric): Secondary | ICD-10-CM | POA: Diagnosis not present

## 2017-11-19 MED ORDER — AMBULATORY NON FORMULARY MEDICATION: 0 refills | Status: DC

## 2017-11-19 NOTE — Assessment & Plan Note (Signed)
Recent fall, she did well after glenohumeral injection 2 months ago, x-rays, formal physical therapy. Sling for 1 week. Return to see me in 6 weeks.

## 2017-11-19 NOTE — Progress Notes (Signed)
Subjective:    I'm seeing this patient as a consultation for: Dr. Beatrice Lecher  CC: Recent fall  HPI: One week ago this pleasant 67 year old female tripped and fell in her yard, fell onto her right shoulder, right knee, and injured her neck.  Her knee is overall feeling okay, she has some pain over her shoulder but this is chronic at baseline from osteoarthritis.  She also has an increase in pain in her neck without overt radiculitis, symptoms are moderate, improving.  I reviewed the past medical history, family history, social history, surgical history, and allergies today and no changes were needed.  Please see the problem list section below in epic for further details.  Past Medical History: Past Medical History:  Diagnosis Date  . Alkaline phosphatase elevation   . Breast cancer ([place]) 2011  . Carpal tunnel syndrome   . CHF (congestive heart failure) ([place])   . Closed fracture of unspecified part of radius (alone)   . DDD (degenerative disc disease), lumbar   . Depression   . Fibromyalgia   . HTN (hypertension)   . Hypothyroidism   . IBS (irritable bowel syndrome)   . LBBB (left bundle branch block)   . Microcytic anemia 05/14/2017   Past Surgical History: Past Surgical History:  Procedure Laterality Date  . BREAST LUMPECTOMY Left 2011  . FOOT SURGERY    . KNEE SURGERY    . PACEMAKER GENERATOR CHANGE N/A 08/23/2014   Procedure: PACEMAKER GENERATOR CHANGE;  Surgeon: Evans Lance, MD;  Location: [facility] CATH LAB;  Service: Cardiovascular;  Laterality: N/A;  . PACEMAKER PLACEMENT  2009   [facility]  . RECTAL SURGERY  1976  . TUBAL LIGATION     Social History: Social History   Socioeconomic History  . Marital status: Divorced    Spouse name: Not on file  . Number of children: Not on file  . Years of education: Not on file  . Highest education level: Not on file  Occupational History  . Not on file  Social Needs  . Financial resource strain: Not on  file  . Food insecurity:    Worry: Not on file    Inability: Not on file  . Transportation needs:    Medical: Not on file    Non-medical: Not on file  Tobacco Use  . Smoking status: Former Smoker    Last attempt to quit: 08/24/1966    Years since quitting: 51.2  . Smokeless tobacco: Never Used  Substance and Sexual Activity  . Alcohol use: No  . Drug use: No  . Sexual activity: Not Currently  Lifestyle  . Physical activity:    Days per week: Not on file    Minutes per session: Not on file  . Stress: Not on file  Relationships  . Social connections:    Talks on phone: Not on file    Gets together: Not on file    Attends religious service: Not on file    Active member of club or organization: Not on file    Attends meetings of clubs or organizations: Not on file    Relationship status: Not on file  Other Topics Concern  . Not on file  Social History Narrative  . Not on file   Family History: Family History  Problem Relation Age of Onset  . Melanoma Mother   . Heart failure Unknown   . Breast cancer Unknown   . Hypertension Unknown    Allergies: Allergies  Allergen  Reactions  . Penicillins Anaphylaxis  . Petrolatum-Zinc Oxide Rash  . Accupril [Quinapril Hcl] Other (See Comments)    Hyperkalemia  . Aspirin Other (See Comments)    Pt states "burns holes in stomach"  . Atorvastatin Other (See Comments)    msucle aches  . Baking Soda-Fluoride [Sodium Fluoride] Itching  . Codeine Nausea And Vomiting  . Fluticasone Other (See Comments)    Nasal ulcer.    . Furosemide Other (See Comments)    Nose bleed  . Lactose Intolerance (Gi) Other (See Comments)    Increased calcium, sneezing, nasal issues  . Levocetirizine     Doesn't recall  . Losartan Potassium Itching  . Metformin And Related Diarrhea  . Nasonex [Mometasone Furoate] Other (See Comments)    nosebleed  . Quinine     unknown  . Rifampin Other (See Comments)    DOES NOT REMEMBER THIS MEDICATION OR  ALLERGY  . Clindamycin/Lincomycin Rash  . Iodinated Diagnostic Agents Itching and Rash    Pt states she has had ultrasound with dye but was fine  . Miralax [Polyethylene Glycol] Rash  . Sulfa Antibiotics Rash  . Sulfamethoxazole Rash  . Sulfonamide Derivatives Rash  . Tramadol Rash    Anxiety  . Wool Alcohol [Lanolin] Rash  . Zoledronic Acid Other (See Comments) and Rash    shaking   Medications: See med rec.  Review of Systems: No headache, visual changes, nausea, vomiting, diarrhea, constipation, dizziness, abdominal pain, skin rash, fevers, chills, night sweats, weight loss, swollen lymph nodes, body aches, joint swelling, muscle aches, chest pain, shortness of breath, mood changes, visual or auditory hallucinations.   Objective:   General: Well Developed, well nourished, and in no acute distress.  Neuro:  Extra-ocular muscles intact, able to move all 4 extremities, sensation grossly intact.  Deep tendon reflexes tested were normal. Psych: Alert and oriented, mood congruent with affect. ENT:  Ears and nose appear unremarkable.  Hearing grossly normal. Neck: Unremarkable overall appearance, trachea midline.  No visible thyroid enlargement. Eyes: Conjunctivae and lids appear unremarkable.  Pupils equal and round. Skin: Warm and dry, no rashes noted.  Cardiovascular: Pulses palpable, no extremity edema. Neck: Negative spurling's Full neck range of motion Grip strength and sensation normal in bilateral hands Strength good C4 to T1 distribution No sensory change to C4 to T1 Reflexes normal Right shoulder: Inspection reveals no abnormalities, atrophy or asymmetry. Palpation is normal with no tenderness over AC joint or bicipital groove. External rotation is limited to approximately 10 degrees, abduction to 20 or 30 degrees, these rotations and movements are painful. Rotator cuff strength normal throughout. No signs of impingement with negative Neer and Hawkin's tests, empty  can. Speeds and Yergason's tests normal. No labral pathology noted with negative Obrien's, negative crank, negative clunk, and good stability. Normal scapular function observed. No painful arc and no drop arm sign. No apprehension sign  Impression and Recommendations:   This case required medical decision making of moderate complexity.  Primary osteoarthritis of right shoulder Recent fall, she did well after glenohumeral injection 2 months ago, x-rays, formal physical therapy. Sling for 1 week. Return to see me in 6 weeks.  DDD (degenerative disc disease), cervical X-rays, formal PT. Return in 6 weeks, MR for interventional planning if no better. Considering history of falls am also going to write a prescription for a rolling 4 post Rappa with seat. ___________________________________________ [name]. [name], M.D., ABFM., CAQSM. Primary Care and [employer]  Instructor of [place] [employer]

## 2017-11-19 NOTE — Assessment & Plan Note (Signed)
X-rays, formal PT. Return in 6 weeks, MR for interventional planning if no better. Considering history of falls am also going to write a prescription for a rolling 4 post [place] with seat.

## 2017-11-22 DIAGNOSIS — G4733 Obstructive sleep apnea (adult) (pediatric): Secondary | ICD-10-CM | POA: Diagnosis not present

## 2017-11-22 DIAGNOSIS — M5030 Other cervical disc degeneration, unspecified cervical region: Secondary | ICD-10-CM | POA: Diagnosis not present

## 2017-11-25 ENCOUNTER — Encounter: Payer: Self-pay | Admitting: Family Medicine

## 2017-11-25 ENCOUNTER — Ambulatory Visit: Payer: Medicare Other | Admitting: Family Medicine

## 2017-11-25 ENCOUNTER — Ambulatory Visit (INDEPENDENT_AMBULATORY_CARE_PROVIDER_SITE_OTHER): Payer: Medicare Other | Admitting: Family Medicine

## 2017-11-25 VITALS — BP 134/70 | HR 82 | Ht 61.0 in | Wt 223.0 lb

## 2017-11-25 DIAGNOSIS — M19011 Primary osteoarthritis, right shoulder: Secondary | ICD-10-CM

## 2017-11-25 DIAGNOSIS — J4540 Moderate persistent asthma, uncomplicated: Secondary | ICD-10-CM

## 2017-11-25 DIAGNOSIS — E118 Type 2 diabetes mellitus with unspecified complications: Secondary | ICD-10-CM | POA: Diagnosis not present

## 2017-11-25 DIAGNOSIS — K921 Melena: Secondary | ICD-10-CM | POA: Diagnosis not present

## 2017-11-25 DIAGNOSIS — G5603 Carpal tunnel syndrome, bilateral upper limbs: Secondary | ICD-10-CM | POA: Diagnosis not present

## 2017-11-25 LAB — POCT GLYCOSYLATED HEMOGLOBIN (HGB A1C): Hemoglobin A1C: 6.3

## 2017-11-25 NOTE — Progress Notes (Signed)
Subjective:    CC:   HPI: Diabetes - no hypoglycemic events. No wounds or sores that are not healing well. No increased thirst or urination. Checking glucose at home. Taking medications as prescribed without any side effects.  Brought in glucose meter.  Her 7-day average is 129, 14-day average 127 and 30-day average of 124 which is fantastic.  90-day average of 132.  Is currently on 40 units of Lantus.  She was worried because she could not get her glucose under 100 but overall her log looks fantastic.  She is now in a sling for her right shoulder.  She said she is fallen since she last saw me and has significant arthritis in that shoulder.  Dr. [name] had recommended physical therapy but she checked with her insurance and it was going to be $40 a visit even if it was home health physical therapy.  She said she just cannot afford that.  She said she might try to go to one session and see if they can show her some things to work on on her own at home.  She also wanted to let me know that she actually had a black stool for a day or 2 after eating [place] food.  She says she had some jalapeno salsa.  She says she has not noticed it since then but did want me to just be aware so that the next time we check her iron we make sure it has not dropped.  She also complains that her carpal tunnel is flared back up again.  She is having pain in her wrist and numbness and tingling in her fingers.  She is not been using any night splints.  She actually had a couple tunnel surgery years ago, though I did not have it on her surgical list.  In regards to her asthma she does follow with pulmonary.  She says she is been using her Advair and Spiriva regularly and overall is says she is doing better than she normally does this time a year which is great.  Past medical history, Surgical history, Family history not pertinant except as noted below, Social history, Allergies, and medications have been entered into the  medical record, reviewed, and corrections made.   Review of Systems: No fevers, chills, night sweats, weight loss, chest pain, or shortness of breath.   Objective:    General: Well Developed, well nourished, and in no acute distress.  Neuro: Alert and oriented x3, extra-ocular muscles intact, sensation grossly intact.  HEENT: Normocephalic, atraumatic  Skin: Warm and dry, no rashes. Cardiac: Regular rate and rhythm, no murmurs rubs or gallops, no lower extremity edema.  Respiratory: Clear to auscultation bilaterally. Not using accessory muscles, speaking in full sentences.   Impression and Recommendations:    DM -well controlled.  Hemoglobin A1c of 6.3 today which is absolutely fantastic.  Continue current regimen.  She is not having any hypoglycemic events and her A1c looks fantastic.  Continue work on [employer] and try to stay active as much as she is able to.  She is not currently on any oral agents.  Intolerant to ACE inhibitors and arms.  Right shoulder ostia arthritis-I will let Dr. Darene Lamer know that she will not be able to go to physical therapy but may be will try to go to one session.  Black stool-she felt like it was very short-lived after eating jalapenos.  [facility] about 3-1/2 years ago.  Certainly  if it occurs again that I think she needs referral back to GI and probably stool cards.  Bilateral carpal tunnel syndrome-recommend that she start wearing her night splints again.  If not improving over the next few weeks and please let me know.  Asthma, moderate persistent-continue with Advair and Spiriva.  Keep follow-up with pulmonology.

## 2017-12-01 ENCOUNTER — Ambulatory Visit: Payer: Medicare Other | Admitting: Rehabilitative and Restorative Service Providers"

## 2017-12-01 ENCOUNTER — Other Ambulatory Visit: Payer: Self-pay | Admitting: Family Medicine

## 2017-12-04 ENCOUNTER — Other Ambulatory Visit: Payer: Self-pay | Admitting: Family Medicine

## 2017-12-06 DIAGNOSIS — J3089 Other allergic rhinitis: Secondary | ICD-10-CM | POA: Diagnosis not present

## 2017-12-06 DIAGNOSIS — J301 Allergic rhinitis due to pollen: Secondary | ICD-10-CM | POA: Diagnosis not present

## 2017-12-06 DIAGNOSIS — J4541 Moderate persistent asthma with (acute) exacerbation: Secondary | ICD-10-CM | POA: Diagnosis not present

## 2017-12-06 DIAGNOSIS — J3081 Allergic rhinitis due to animal (cat) (dog) hair and dander: Secondary | ICD-10-CM | POA: Diagnosis not present

## 2017-12-06 DIAGNOSIS — J4540 Moderate persistent asthma, uncomplicated: Secondary | ICD-10-CM | POA: Diagnosis not present

## 2017-12-29 ENCOUNTER — Ambulatory Visit (INDEPENDENT_AMBULATORY_CARE_PROVIDER_SITE_OTHER): Payer: Medicare Other | Admitting: *Deleted

## 2017-12-29 DIAGNOSIS — I442 Atrioventricular block, complete: Secondary | ICD-10-CM | POA: Diagnosis not present

## 2017-12-29 NOTE — Progress Notes (Signed)
Remote pacemaker transmission.   

## 2017-12-30 ENCOUNTER — Ambulatory Visit (INDEPENDENT_AMBULATORY_CARE_PROVIDER_SITE_OTHER): Payer: Medicare Other

## 2017-12-30 ENCOUNTER — Encounter: Payer: Self-pay | Admitting: Sports Medicine

## 2017-12-30 ENCOUNTER — Ambulatory Visit (INDEPENDENT_AMBULATORY_CARE_PROVIDER_SITE_OTHER): Payer: Medicare Other | Admitting: Sports Medicine

## 2017-12-30 ENCOUNTER — Encounter: Payer: Self-pay | Admitting: Cardiology

## 2017-12-30 DIAGNOSIS — G8929 Other chronic pain: Secondary | ICD-10-CM

## 2017-12-30 DIAGNOSIS — M25511 Pain in right shoulder: Secondary | ICD-10-CM | POA: Diagnosis not present

## 2017-12-30 DIAGNOSIS — M5030 Other cervical disc degeneration, unspecified cervical region: Secondary | ICD-10-CM | POA: Diagnosis not present

## 2017-12-30 DIAGNOSIS — M19011 Primary osteoarthritis, right shoulder: Secondary | ICD-10-CM | POA: Diagnosis not present

## 2017-12-30 NOTE — Assessment & Plan Note (Signed)
Persistent pain in spite of injection, home rehab exercises. She cannot have an MRI, pacemaker is not MRI safe. Adding CT of the shoulder. No contrast.

## 2017-12-30 NOTE — Progress Notes (Signed)
Subjective:    CC: Follow-up  HPI: This is a pleasant 67 year old female, she returns for follow-up of her neck pain, shoulder pain.  Neck pain has resolved with stretches and rehab exercises, unfortunately her shoulder pain persists, its multifactorial, localized at the glenohumeral joint, we did a glenohumeral injection that provided only temporary relief.  She then had a fall which set her pain off again, short period of sling immobilization has not helped.  Pain is severe, persistent.  I reviewed the past medical history, family history, social history, surgical history, and allergies today and no changes were needed.  Please see the problem list section below in epic for further details.  Past Medical History: Past Medical History:  Diagnosis Date  . Alkaline phosphatase elevation   . Breast cancer ([place]) 2011  . Carpal tunnel syndrome   . CHF (congestive heart failure) ([place])   . Closed fracture of unspecified part of radius (alone)   . DDD (degenerative disc disease), lumbar   . Depression   . Fibromyalgia   . HTN (hypertension)   . Hypothyroidism   . IBS (irritable bowel syndrome)   . LBBB (left bundle branch block)   . Microcytic anemia 05/14/2017   Past Surgical History: Past Surgical History:  Procedure Laterality Date  . BREAST LUMPECTOMY Left 2011  . FOOT SURGERY    . KNEE SURGERY    . PACEMAKER GENERATOR CHANGE N/A 08/23/2014   Procedure: PACEMAKER GENERATOR CHANGE;  Surgeon: Evans Lance, MD;  Location: [facility] CATH LAB;  Service: Cardiovascular;  Laterality: N/A;  . PACEMAKER PLACEMENT  2009   [facility]  . RECTAL SURGERY  1976  . TUBAL LIGATION     Social History: Social History   Socioeconomic History  . Marital status: Divorced    Spouse name: Not on file  . Number of children: Not on file  . Years of education: Not on file  . Highest education level: Not on file  Occupational History  . Not on file  Social Needs  . Financial resource strain:  Not on file  . Food insecurity:    Worry: Not on file    Inability: Not on file  . Transportation needs:    Medical: Not on file    Non-medical: Not on file  Tobacco Use  . Smoking status: Former Smoker    Last attempt to quit: 08/24/1966    Years since quitting: 51.3  . Smokeless tobacco: Never Used  Substance and Sexual Activity  . Alcohol use: No  . Drug use: No  . Sexual activity: Not Currently  Lifestyle  . Physical activity:    Days per week: Not on file    Minutes per session: Not on file  . Stress: Not on file  Relationships  . Social connections:    Talks on phone: Not on file    Gets together: Not on file    Attends religious service: Not on file    Active member of club or organization: Not on file    Attends meetings of clubs or organizations: Not on file    Relationship status: Not on file  Other Topics Concern  . Not on file  Social History Narrative  . Not on file   Family History: Family History  Problem Relation Age of Onset  . Melanoma Mother   . Heart failure Unknown   . Breast cancer Unknown   . Hypertension Unknown    Allergies: Allergies  Allergen Reactions  . Penicillins Anaphylaxis  .  Petrolatum-Zinc Oxide Rash  . Accupril [Quinapril Hcl] Other (See Comments)    Hyperkalemia  . Aspirin Other (See Comments)    Pt states "burns holes in stomach"  . Atorvastatin Other (See Comments)    msucle aches  . Baking Soda-Fluoride [Sodium Fluoride] Itching  . Codeine Nausea And Vomiting  . Fluticasone Other (See Comments)    Nasal ulcer.    . Furosemide Other (See Comments)    Nose bleed  . Lactose Intolerance (Gi) Other (See Comments)    Increased calcium, sneezing, nasal issues  . Levocetirizine     Doesn't recall  . Losartan Potassium Itching  . Metformin And Related Diarrhea  . Nasonex [Mometasone Furoate] Other (See Comments)    nosebleed  . Quinine     unknown  . Rifampin Other (See Comments)    DOES NOT REMEMBER THIS MEDICATION OR  ALLERGY  . Clindamycin/Lincomycin Rash  . Iodinated Diagnostic Agents Itching and Rash    Pt states she has had ultrasound with dye but was fine  . Miralax [Polyethylene Glycol] Rash  . Sulfa Antibiotics Rash  . Sulfamethoxazole Rash  . Sulfonamide Derivatives Rash  . Tramadol Rash    Anxiety  . Wool Alcohol [Lanolin] Rash  . Zoledronic Acid Other (See Comments) and Rash    shaking   Medications: See med rec.  Review of Systems: No fevers, chills, night sweats, weight loss, chest pain, or shortness of breath.   Objective:    General: Well Developed, well nourished, and in no acute distress.  Tearful in exam room Neuro: Alert and oriented x3, extra-ocular muscles intact, sensation grossly intact.  HEENT: Normocephalic, atraumatic, pupils equal round reactive to light, neck supple, no masses, no lymphadenopathy, thyroid nonpalpable.  Skin: Warm and dry, no rashes. Cardiac: Regular rate and rhythm, no murmurs rubs or gallops, no lower extremity edema.  Respiratory: Clear to auscultation bilaterally. Not using accessory muscles, speaking in full sentences.  Impression and Recommendations:    Primary osteoarthritis of right shoulder Persistent pain in spite of injection, home rehab exercises. She cannot have an MRI, pacemaker is not MRI safe. Adding CT of the shoulder. No contrast.  DDD (degenerative disc disease), cervical Mild DDD, better with stretches and home exercises, she was not able to afford formal physical therapy. Tearful in the exam room, uncontrolled depression likely.  I spent 25 minutes with this patient, greater than 50% was face-to-face time counseling regarding the above diagnoses ___________________________________________ [name]. [name], M.D., ABFM., CAQSM. Primary Care and [place] Instructor of [place] of [facility]

## 2017-12-30 NOTE — Assessment & Plan Note (Signed)
Mild DDD, better with stretches and home exercises, she was not able to afford formal physical therapy. Tearful in the exam room, uncontrolled depression likely.

## 2018-01-05 DIAGNOSIS — K7581 Nonalcoholic steatohepatitis (NASH): Secondary | ICD-10-CM | POA: Diagnosis not present

## 2018-01-05 DIAGNOSIS — D509 Iron deficiency anemia, unspecified: Secondary | ICD-10-CM | POA: Diagnosis not present

## 2018-01-05 DIAGNOSIS — K740 Hepatic fibrosis: Secondary | ICD-10-CM | POA: Diagnosis not present

## 2018-01-13 ENCOUNTER — Ambulatory Visit (INDEPENDENT_AMBULATORY_CARE_PROVIDER_SITE_OTHER): Payer: Medicare Other | Admitting: Sports Medicine

## 2018-01-13 ENCOUNTER — Encounter: Payer: Self-pay | Admitting: Sports Medicine

## 2018-01-13 DIAGNOSIS — M19011 Primary osteoarthritis, right shoulder: Secondary | ICD-10-CM | POA: Diagnosis not present

## 2018-01-13 NOTE — Progress Notes (Signed)
Subjective:    CC: Right shoulder pain  HPI: This is a pleasant 67 year old female, we have been treating her glenohumeral arthritis for some time now, we have been recommending shoulder arthroplasty for a while as well, she is had several injections, ultrasound-guided, glenohumeral, more recently we did a CT, she has a non-MRI compatible pacer/ICD.  She is requesting an injection today.  Pain is severe, persistent, localized at the joint line front and back.  No radiation.  Nothing radicular.  I reviewed the past medical history, family history, social history, surgical history, and allergies today and no changes were needed.  Please see the problem list section below in epic for further details.  Past Medical History: Past Medical History:  Diagnosis Date  . Alkaline phosphatase elevation   . Breast cancer ([place]) 2011  . Carpal tunnel syndrome   . CHF (congestive heart failure) ([place])   . Closed fracture of unspecified part of radius (alone)   . DDD (degenerative disc disease), lumbar   . Depression   . Fibromyalgia   . HTN (hypertension)   . Hypothyroidism   . IBS (irritable bowel syndrome)   . LBBB (left bundle branch block)   . Microcytic anemia 05/14/2017   Past Surgical History: Past Surgical History:  Procedure Laterality Date  . BREAST LUMPECTOMY Left 2011  . FOOT SURGERY    . KNEE SURGERY    . PACEMAKER GENERATOR CHANGE N/A 08/23/2014   Procedure: PACEMAKER GENERATOR CHANGE;  Surgeon: Evans Lance, MD;  Location: [facility] CATH LAB;  Service: Cardiovascular;  Laterality: N/A;  . PACEMAKER PLACEMENT  2009   [facility]  . RECTAL SURGERY  1976  . TUBAL LIGATION     Social History: Social History   Socioeconomic History  . Marital status: Divorced    Spouse name: Not on file  . Number of children: Not on file  . Years of education: Not on file  . Highest education level: Not on file  Occupational History  . Not on file  Social Needs  . Financial resource  strain: Not on file  . Food insecurity:    Worry: Not on file    Inability: Not on file  . Transportation needs:    Medical: Not on file    Non-medical: Not on file  Tobacco Use  . Smoking status: Former Smoker    Last attempt to quit: 08/24/1966    Years since quitting: 51.4  . Smokeless tobacco: Never Used  Substance and Sexual Activity  . Alcohol use: No  . Drug use: No  . Sexual activity: Not Currently  Lifestyle  . Physical activity:    Days per week: Not on file    Minutes per session: Not on file  . Stress: Not on file  Relationships  . Social connections:    Talks on phone: Not on file    Gets together: Not on file    Attends religious service: Not on file    Active member of club or organization: Not on file    Attends meetings of clubs or organizations: Not on file    Relationship status: Not on file  Other Topics Concern  . Not on file  Social History Narrative  . Not on file   Family History: Family History  Problem Relation Age of Onset  . Melanoma Mother   . Heart failure Unknown   . Breast cancer Unknown   . Hypertension Unknown    Allergies: Allergies  Allergen Reactions  .  Penicillins Anaphylaxis  . Petrolatum-Zinc Oxide Rash  . Accupril [Quinapril Hcl] Other (See Comments)    Hyperkalemia  . Aspirin Other (See Comments)    Pt states "burns holes in stomach"  . Atorvastatin Other (See Comments)    msucle aches  . Baking Soda-Fluoride [Sodium Fluoride] Itching  . Codeine Nausea And Vomiting  . Fluticasone Other (See Comments)    Nasal ulcer.    . Furosemide Other (See Comments)    Nose bleed  . Lactose Intolerance (Gi) Other (See Comments)    Increased calcium, sneezing, nasal issues  . Levocetirizine     Doesn't recall  . Losartan Potassium Itching  . Metformin And Related Diarrhea  . Nasonex [Mometasone Furoate] Other (See Comments)    nosebleed  . Quinine     unknown  . Rifampin Other (See Comments)    DOES NOT REMEMBER THIS  MEDICATION OR ALLERGY  . Clindamycin/Lincomycin Rash  . Iodinated Diagnostic Agents Itching and Rash    Pt states she has had ultrasound with dye but was fine  . Miralax [Polyethylene Glycol] Rash  . Sulfa Antibiotics Rash  . Sulfamethoxazole Rash  . Sulfonamide Derivatives Rash  . Tramadol Rash    Anxiety  . Wool Alcohol [Lanolin] Rash  . Zoledronic Acid Other (See Comments) and Rash    shaking   Medications: See med rec.  Review of Systems: No fevers, chills, night sweats, weight loss, chest pain, or shortness of breath.   Objective:    General: Well Developed, well nourished, and in no acute distress.  Neuro: Alert and oriented x3, extra-ocular muscles intact, sensation grossly intact.  HEENT: Normocephalic, atraumatic, pupils equal round reactive to light, neck supple, no masses, no lymphadenopathy, thyroid nonpalpable.  Skin: Warm and dry, no rashes. Cardiac: Regular rate and rhythm, no murmurs rubs or gallops, no lower extremity edema.  Respiratory: Clear to auscultation bilaterally. Not using accessory muscles, speaking in full sentences.  Procedure: Real-time Ultrasound Guided Injection of right glenohumeral joint Device: GE Logiq E  Verbal informed consent obtained.  Time-out conducted.  Noted no overlying erythema, induration, or other signs of local infection.  Skin prepped in a sterile fashion.  Local anesthesia: Topical Ethyl chloride.  With sterile technique and under real time ultrasound guidance: 22-gauge spinal needle advanced into the joint from a posterior approach, injected 1 cc: 40, 2 cc lidocaine, 2 cc bupivacaine. Completed without difficulty  Pain immediately resolved suggesting accurate placement of the medication.  Advised to call if fevers/chills, erythema, induration, drainage, or persistent bleeding.  Images permanently stored and available for review in the ultrasound unit.  Impression: Technically successful ultrasound guided  injection.  Impression and Recommendations:    Primary osteoarthritis of right shoulder Tanya Hogan has had right shoulder arthritis for some time now, she is had several glenohumeral joint injections, I have been recommending shoulder arthroplasty for some time. Her last injection was about 4 months ago, repeat glenohumeral injection with ultrasound guidance today, I think it is come time for her to discuss shoulder arthroplasty with Dr. Tamera Punt.  Referral placed, she does have a CT in the chart (non-MRI compatible pacer/ICD).  ___________________________________________ [name]. [name], M.D., ABFM., CAQSM. Primary Care and [place] Instructor of [place] of [facility]

## 2018-01-13 NOTE — Assessment & Plan Note (Signed)
Tanya Hogan has had right shoulder arthritis for some time now, she is had several glenohumeral joint injections, I have been recommending shoulder arthroplasty for some time. Her last injection was about 4 months ago, repeat glenohumeral injection with ultrasound guidance today, I think it is come time for her to discuss shoulder arthroplasty with Dr. Tamera Punt.  Referral placed, she does have a CT in the chart (non-MRI compatible pacer/ICD).

## 2018-01-15 DIAGNOSIS — M19011 Primary osteoarthritis, right shoulder: Secondary | ICD-10-CM | POA: Diagnosis not present

## 2018-01-24 ENCOUNTER — Telehealth: Payer: Self-pay | Admitting: *Deleted

## 2018-01-24 NOTE — Telephone Encounter (Signed)
   Primary Cardiologist: Dr Lovena Le  Chart reviewed and patient contacted by phone today as part of pre-operative protocol coverage. Given past medical history and time since last visit, based on ACC/AHA guidelines, Tanya Hogan would be at acceptable risk for the planned procedure without further cardiovascular testing.   I will route this recommendation to the requesting party via Epic fax function and remove from pre-op pool.  Please call with questions.  Kerin Ransom, PA-C 01/24/2018, 3:22 PM

## 2018-01-24 NOTE — Telephone Encounter (Signed)
   [facility] Pre-operative Risk Assessment    Request for surgical clearance:  1. What type of surgery is being performed? Right Total Shoulder Arthroplasty   2. When is this surgery scheduled? TBD   3. What type of clearance is required (medical clearance vs. Pharmacy clearance to hold med vs. Both)? Medical  4. Are there any medications that need to be held prior to surgery and how long? None noted   5. Practice name and name of physician performing surgery? [facility]. Dr. Tamera Punt   6. What is your office phone number [phone] (Surgery coordinator-Kasey Ola Spurr)   7.   What is your office fax number [phone] (Surgery coordinator--Kasey Ola Spurr)  8.   Anesthesia type (None, local, MAC, general) ? Choice    _________________________________________________________________   (provider comments below)

## 2018-01-25 LAB — CUP PACEART REMOTE DEVICE CHECK
Battery Voltage: 2.99 V
Brady Statistic AP VP Percent: 41.65 %
Brady Statistic AP VS Percent: 0.46 %
Brady Statistic AS VP Percent: 56.21 %
Brady Statistic AS VS Percent: 1.68 %
Brady Statistic RA Percent Paced: 41.57 %
Date Time Interrogation Session: 20190508110616
Implantable Lead Implant Date: 20030324
Implantable Lead Location: 753859
Implantable Lead Model: 4068
Lead Channel Impedance Value: 1064 Ohm
Lead Channel Impedance Value: 4047 Ohm
Lead Channel Impedance Value: 4047 Ohm
Lead Channel Impedance Value: 418 Ohm
Lead Channel Impedance Value: 570 Ohm
Lead Channel Pacing Threshold Pulse Width: 0.4 ms
Lead Channel Sensing Intrinsic Amplitude: 1.75 mV
Lead Channel Sensing Intrinsic Amplitude: 6 mV
Lead Channel Setting Pacing Amplitude: 1.5 V
Lead Channel Setting Pacing Amplitude: 3.5 V
Lead Channel Setting Pacing Pulse Width: 0.8 ms
Lead Channel Setting Pacing Pulse Width: 0.8 ms
Lead Channel Setting Sensing Sensitivity: 4 mV
MDC IDC LEAD IMPLANT DT: 20030324
MDC IDC LEAD IMPLANT DT: 20030324
MDC IDC LEAD LOCATION: 753858
MDC IDC LEAD LOCATION: 753860
MDC IDC MSMT BATTERY REMAINING LONGEVITY: 44 mo
MDC IDC MSMT LEADCHNL LV IMPEDANCE VALUE: 1235 Ohm
MDC IDC MSMT LEADCHNL LV IMPEDANCE VALUE: 4047 Ohm
MDC IDC MSMT LEADCHNL RA IMPEDANCE VALUE: 399 Ohm
MDC IDC MSMT LEADCHNL RA IMPEDANCE VALUE: 437 Ohm
MDC IDC MSMT LEADCHNL RA PACING THRESHOLD AMPLITUDE: 0.875 V
MDC IDC MSMT LEADCHNL RA SENSING INTR AMPL: 1.75 mV
MDC IDC MSMT LEADCHNL RV PACING THRESHOLD AMPLITUDE: 2.125 V
MDC IDC MSMT LEADCHNL RV PACING THRESHOLD PULSEWIDTH: 0.4 ms
MDC IDC MSMT LEADCHNL RV SENSING INTR AMPL: 6 mV
MDC IDC PG IMPLANT DT: 20151231
MDC IDC SET LEADCHNL LV PACING AMPLITUDE: 2.5 V
MDC IDC STAT BRADY RV PERCENT PACED: 97 %

## 2018-01-26 DIAGNOSIS — G4733 Obstructive sleep apnea (adult) (pediatric): Secondary | ICD-10-CM | POA: Diagnosis not present

## 2018-01-26 DIAGNOSIS — Z9989 Dependence on other enabling machines and devices: Secondary | ICD-10-CM | POA: Diagnosis not present

## 2018-01-31 ENCOUNTER — Other Ambulatory Visit: Payer: Self-pay | Admitting: Orthopedic Surgery

## 2018-01-31 ENCOUNTER — Other Ambulatory Visit: Payer: Self-pay

## 2018-01-31 ENCOUNTER — Other Ambulatory Visit: Payer: Self-pay | Admitting: *Deleted

## 2018-01-31 ENCOUNTER — Other Ambulatory Visit: Payer: Self-pay | Admitting: Family Medicine

## 2018-01-31 MED ORDER — PRAVASTATIN SODIUM 40 MG PO TABS: 40.0000 mg | ORAL_TABLET | Freq: Every day | ORAL | 1 refills | Status: DC

## 2018-01-31 MED ORDER — ALENDRONATE SODIUM 70 MG PO TABS: 70.0000 mg | ORAL_TABLET | ORAL | 4 refills | Status: DC

## 2018-01-31 MED ORDER — BUMETANIDE 1 MG PO TABS: 1.0000 mg | ORAL_TABLET | Freq: Two times a day (BID) | ORAL | 1 refills | Status: DC

## 2018-02-01 ENCOUNTER — Other Ambulatory Visit: Payer: Self-pay | Admitting: Orthopedic Surgery

## 2018-02-03 DIAGNOSIS — M25611 Stiffness of right shoulder, not elsewhere classified: Secondary | ICD-10-CM | POA: Diagnosis not present

## 2018-02-03 DIAGNOSIS — M19011 Primary osteoarthritis, right shoulder: Secondary | ICD-10-CM | POA: Diagnosis not present

## 2018-02-03 DIAGNOSIS — M25511 Pain in right shoulder: Secondary | ICD-10-CM | POA: Diagnosis not present

## 2018-02-21 ENCOUNTER — Ambulatory Visit (INDEPENDENT_AMBULATORY_CARE_PROVIDER_SITE_OTHER): Payer: Medicare Other | Admitting: Family Medicine

## 2018-02-21 ENCOUNTER — Encounter: Payer: Self-pay | Admitting: Family Medicine

## 2018-02-21 VITALS — BP 131/60 | HR 75 | Ht 61.0 in | Wt 227.0 lb

## 2018-02-21 DIAGNOSIS — K7581 Nonalcoholic steatohepatitis (NASH): Secondary | ICD-10-CM | POA: Diagnosis not present

## 2018-02-21 DIAGNOSIS — E118 Type 2 diabetes mellitus with unspecified complications: Secondary | ICD-10-CM | POA: Diagnosis not present

## 2018-02-21 DIAGNOSIS — R140 Abdominal distension (gaseous): Secondary | ICD-10-CM

## 2018-02-21 DIAGNOSIS — I10 Essential (primary) hypertension: Secondary | ICD-10-CM

## 2018-02-21 DIAGNOSIS — E038 Other specified hypothyroidism: Secondary | ICD-10-CM | POA: Diagnosis not present

## 2018-02-21 LAB — POCT GLYCOSYLATED HEMOGLOBIN (HGB A1C): HEMOGLOBIN A1C: 7 % — AB (ref 4.0–5.6)

## 2018-02-21 MED ORDER — INSULIN GLARGINE 100 UNITS/ML SOLOSTAR PEN: 40.0000 [IU] | PEN_INJECTOR | Freq: Every day | SUBCUTANEOUS | 1 refills | Status: DC

## 2018-02-21 NOTE — Progress Notes (Signed)
Subjective:    CC:   HPI:  Diabetes - no hypoglycemic events. No wounds or sores that are not healing well. No increased thirst or urination. Checking glucose at home. Taking medications as prescribed without any side effects.  He is currently up to 40 units of Lantus.  She is really has been trying to keep her diet under good control.  Her eye exam is up-to-date.  Hypertension- Pt denies chest pain, SOB, dizziness, or heart palpitations.  Taking meds as directed w/o problems.  Denies medication side effects.    NASH -last liver enzymes from September were normal.  She had a mild elevation in AST in 2017.  Hypothyroidism-no recent skin or hair changes.  No significant changes in weight.  Taking medication regularly.  Currently on 75 mcg daily.  She has been having some difficulty with bloating in her abdomen.  She is been taking her Bentyl little bit more regularly and that has been helping.  She says when she passes a lot of gas she does feel better.  She has been trying to avoid more sugary foods.  She thinks she is been swallowing air from her CPAP.  She wanted to let me know that she is scheduled for her right shoulder surgery this summer.  Past medical history, Surgical history, Family history not pertinant except as noted below, Social history, Allergies, and medications have been entered into the medical record, reviewed, and corrections made.   Review of Systems: No fevers, chills, night sweats, weight loss, chest pain, or shortness of breath.   Objective:    General: Well Developed, well nourished, and in no acute distress.  Neuro: Alert and oriented x3, extra-ocular muscles intact, sensation grossly intact.  HEENT: Normocephalic, atraumatic  Skin: Warm and dry, no rashes. Cardiac: Regular rate and rhythm, no murmurs rubs or gallops, no lower extremity edema.  Respiratory: Clear to auscultation bilaterally. Not using accessory muscles, speaking in full sentences.   Impression  and Recommendations:    DM -C was up to 7.0 but she said she just had it checked through her health insurance nurse in the home a couple weeks ago and it was 6.2.  So we will get of the new puncture to confirm.  We will adjust her dose if needed at that point.  She is Artie up to 40 units of Lantus.. Continue current regimen. Follow up in  3 months.    HTN - Well controlled. Continue current regimen. Follow up in  4 months.    NASH - due to recheck liver enzymes.  She has been following up with gastroenterology.  Hypothyroidism-well overdue to recheck TSH.  Will adjust level if needed.  Abdominal bloating-she does have IBS so it certainly could be related.  She does follow with GI regularly and has a history of [place].  She could be swallowing some air but typically that results and belching from her CPAP.  +1 of her other physicians actually monitors her CPAP and in fact recently had an updated sleep study and they are actually considering putting her on BiPAP.

## 2018-02-22 DIAGNOSIS — K7581 Nonalcoholic steatohepatitis (NASH): Secondary | ICD-10-CM | POA: Diagnosis not present

## 2018-02-22 DIAGNOSIS — E118 Type 2 diabetes mellitus with unspecified complications: Secondary | ICD-10-CM | POA: Diagnosis not present

## 2018-02-22 DIAGNOSIS — E038 Other specified hypothyroidism: Secondary | ICD-10-CM | POA: Diagnosis not present

## 2018-02-23 LAB — COMPLETE METABOLIC PANEL WITH GFR
AG RATIO: 1.8 (calc) (ref 1.0–2.5)
ALKALINE PHOSPHATASE (APISO): 107 U/L (ref 33–130)
ALT: 24 U/L (ref 6–29)
AST: 24 U/L (ref 10–35)
Albumin: 4.2 g/dL (ref 3.6–5.1)
BILIRUBIN TOTAL: 0.5 mg/dL (ref 0.2–1.2)
BUN: 10 mg/dL (ref 7–25)
CHLORIDE: 106 mmol/L (ref 98–110)
CO2: 26 mmol/L (ref 20–32)
Calcium: 9.7 mg/dL (ref 8.6–10.4)
Creat: 0.78 mg/dL (ref 0.50–0.99)
GFR, Est African American: 91 mL/min/{1.73_m2} (ref >=60)
GFR, Est Non African American: 78 mL/min/{1.73_m2} (ref >=60)
GLUCOSE: 125 mg/dL — AB (ref 65–99)
Globulin: 2.4 g/dL (calc) (ref 1.9–3.7)
POTASSIUM: 4.7 mmol/L (ref 3.5–5.3)
Sodium: 141 mmol/L (ref 135–146)
Total Protein: 6.6 g/dL (ref 6.1–8.1)

## 2018-02-23 LAB — HEMOGLOBIN A1C
Hgb A1c MFr Bld: 6.8 % of total Hgb — ABNORMAL HIGH (ref <5.7)
MEAN PLASMA GLUCOSE: 148 (calc)
eAG (mmol/L): 8.2 (calc)

## 2018-02-23 LAB — LIPID PANEL
CHOLESTEROL: 165 mg/dL (ref <200)
HDL: 65 mg/dL (ref >50)
LDL Cholesterol (Calc): 77 mg/dL (calc)
Non-HDL Cholesterol (Calc): 100 mg/dL (calc) (ref <130)
TRIGLYCERIDES: 130 mg/dL (ref <150)
Total CHOL/HDL Ratio: 2.5 (calc) (ref <5.0)

## 2018-02-23 LAB — TSH: TSH: 4.15 m[IU]/L (ref 0.40–4.50)

## 2018-03-01 ENCOUNTER — Encounter (HOSPITAL_COMMUNITY): Payer: Self-pay

## 2018-03-01 ENCOUNTER — Other Ambulatory Visit (HOSPITAL_COMMUNITY): Payer: Self-pay | Admitting: *Deleted

## 2018-03-01 NOTE — Pre-Procedure Instructions (Signed)
Tanya Hogan  03/01/2018   Your procedure is scheduled on Thursday, March 10, 2018 at 7:30 AM.   Report to [facility] Entrance "A" Admitting Office at 5:30 AM.   Call this number if you have problems the morning of surgery: [phone]   Questions prior to day of surgery, please call [phone] between 8 & 4 PM.   Remember:  Do not eat or drink after midnight Wednesday,  03/09/18.  Take these medicines the morning of surgery with A SIP OF WATER: Bystolic, Diltiazem (Cardizem), Levothyroxine (Synthroid), Pantoprazole (Protonix), Tylenol - if needed, Advair inhaler, Spiriva inhaler, Xopenex nebulizer - if needed, Albuterol inhaler - if needed (bring this inhaler with you day of surgery)  Stop Multivitamins 7 days prior to surgery. Do not use NSAIDS (Ibuprofen, Aleve, etc) or Aspirin products 7 days prior to surgery.  Take 1/2 of your regular dose of Lantus Insulin Wednesday evening. You will take 20 units.    How to Manage Your Diabetes Before Surgery   Why is it important to control my blood sugar before and after surgery?   Improving blood sugar levels before and after surgery helps healing and can limit problems.  A way of improving blood sugar control is eating a healthy diet by:  - Eating less sugar and carbohydrates  - Increasing activity/exercise  - Talk with your doctor about reaching your blood sugar goals  High blood sugars (greater than 180 mg/dL) can raise your risk of infections and slow down your recovery so you will need to focus on controlling your diabetes during the weeks before surgery.  Make sure that the doctor who takes care of your diabetes knows about your planned surgery including the date and location.  How do I manage my blood sugars before surgery?   Check your blood sugar at least 4 times a day, 2 days before surgery to make sure that they are not too high or low.  Check your blood sugar the morning of your surgery when you wake up and  every 2 hours until you get to the Short-Stay unit.  Treat a low blood sugar (less than 70 mg/dL) with 1/2 cup of clear juice (cranberry or apple), 4 glucose tablets, OR glucose gel.  Recheck blood sugar in 15 minutes after treatment (to make sure it is greater than 70 mg/dL).  If blood sugar is not greater than 70 mg/dL on re-check, call [phone] for further instructions.   Report your blood sugar to the Short-Stay nurse when you get to Short-Stay.  References:  [facility], 2007 "How to Manage your Diabetes Before and After Surgery".   Do not wear jewelry, make-up or nail polish.  Do not wear lotions, powders, perfumes or deodorant.  Do not shave 48 hours prior to surgery.    Do not bring valuables to the hospital.  [facility] is not responsible for any belongings or valuables.  Contacts, dentures or bridgework may not be worn into surgery.  Leave your suitcase in the car.  After surgery it may be brought to your room.  For patients admitted to the hospital, discharge time will be determined by your treatment team.  [facility] - Preparing for Surgery  Before surgery, you can play an important role.  Because skin is not sterile, your skin needs to be as free of germs as possible.  You can reduce the number of germs on you skin by washing with CHG (chlorahexidine gluconate) soap before surgery.  CHG is an antiseptic cleaner which kills germs and bonds with the skin to continue killing germs even after washing.  Oral Hygiene is also important in reducing the risk of infection.  Remember to brush your teeth with your regular toothpaste the morning of surgery.  Please DO NOT use if you have an allergy to CHG or antibacterial soaps.  If your skin becomes reddened/irritated stop using the CHG and inform your nurse when you arrive at Short Stay.  Do not shave (including legs and underarms) for at least 48 hours prior to the first CHG shower.  You may shave your  face.  Please follow these instructions carefully:   1.  Shower with CHG Soap the night before surgery and the morning of Surgery.  2.  If you choose to wash your hair, wash your hair first as usual with your normal shampoo.  3.  After you shampoo, rinse your hair and body thoroughly to remove the shampoo. 4.  Use CHG as you would any other liquid soap.  You can apply chg directly to the skin and wash gently with a      scrungie or washcloth.           5.  Apply the CHG Soap to your body ONLY FROM THE NECK DOWN.   Do not use on open wounds or open sores. Avoid contact with your eyes, ears, mouth and genitals (private parts).  Wash genitals (private parts) with your normal soap.  6.  Wash thoroughly, paying special attention to the area where your surgery will be performed.  7.  Thoroughly rinse your body with warm water from the neck down.  8.  DO NOT shower/wash with your normal soap after using and rinsing off the CHG Soap.  9.  Pat yourself dry with a clean towel.            10.  Wear clean pajamas.            11.  Place clean sheets on your bed the night of your first shower and do not sleep with pets.  Day of Surgery  Shower as above. Do not apply any lotions/deodorants the morning of surgery.   Please wear clean clothes to the hospital. Remember to brush your teeth with toothpaste.   Please read over the fact sheets that you were given.

## 2018-03-02 ENCOUNTER — Other Ambulatory Visit: Payer: Self-pay

## 2018-03-02 ENCOUNTER — Encounter (HOSPITAL_COMMUNITY): Payer: Self-pay

## 2018-03-02 ENCOUNTER — Encounter (HOSPITAL_COMMUNITY)
Admission: RE | Admit: 2018-03-02 | Discharge: 2018-03-02 | Disposition: A | Discharge status: Home / Self Care | Payer: Medicare Other | Source: Ambulatory Visit | Attending: Orthopedic Surgery | Admitting: Orthopedic Surgery

## 2018-03-02 ENCOUNTER — Ambulatory Visit (HOSPITAL_COMMUNITY)
Admission: RE | Admit: 2018-03-02 | Discharge: 2018-03-02 | Disposition: A | Discharge status: Home / Self Care | Payer: Medicare Other | Source: Ambulatory Visit | Attending: Orthopedic Surgery | Admitting: Orthopedic Surgery

## 2018-03-02 DIAGNOSIS — E119 Type 2 diabetes mellitus without complications: Secondary | ICD-10-CM | POA: Insufficient documentation

## 2018-03-02 DIAGNOSIS — Z950 Presence of cardiac pacemaker: Secondary | ICD-10-CM | POA: Insufficient documentation

## 2018-03-02 DIAGNOSIS — Z01818 Encounter for other preprocedural examination: Secondary | ICD-10-CM | POA: Insufficient documentation

## 2018-03-02 DIAGNOSIS — I517 Cardiomegaly: Secondary | ICD-10-CM | POA: Insufficient documentation

## 2018-03-02 DIAGNOSIS — R0989 Other specified symptoms and signs involving the circulatory and respiratory systems: Secondary | ICD-10-CM | POA: Diagnosis not present

## 2018-03-02 HISTORY — DX: Unspecified cirrhosis of liver: K74.60

## 2018-03-02 HISTORY — DX: Pneumonia, unspecified organism: J18.9

## 2018-03-02 HISTORY — DX: Presence of cardiac pacemaker: Z95.0

## 2018-03-02 HISTORY — DX: Cardiomegaly: I51.7

## 2018-03-02 HISTORY — DX: Type 2 diabetes mellitus without complications: E11.9

## 2018-03-02 HISTORY — DX: Personal history of urinary calculi: Z87.442

## 2018-03-02 HISTORY — DX: Sleep apnea, unspecified: G47.30

## 2018-03-02 HISTORY — DX: Gastro-esophageal reflux disease without esophagitis: K21.9

## 2018-03-02 HISTORY — DX: Unspecified asthma, uncomplicated: J45.909

## 2018-03-02 LAB — URINALYSIS, ROUTINE W REFLEX MICROSCOPIC
BILIRUBIN URINE: NEGATIVE
Glucose, UA: NEGATIVE mg/dL
HGB URINE DIPSTICK: NEGATIVE
KETONES UR: NEGATIVE mg/dL
Leukocytes, UA: NEGATIVE
Nitrite: NEGATIVE
PROTEIN: NEGATIVE mg/dL
Specific Gravity, Urine: 1.008 (ref 1.005–1.030)
pH: 7 (ref 5.0–8.0)

## 2018-03-02 LAB — CBC WITH DIFFERENTIAL/PLATELET
Abs Immature Granulocytes: 0 10*3/uL (ref 0.0–0.1)
Basophils Absolute: 0.1 10*3/uL (ref 0.0–0.1)
Basophils Relative: 1 %
EOS ABS: 0.1 10*3/uL (ref 0.0–0.7)
EOS PCT: 1 %
HEMATOCRIT: 47 % — AB (ref 36.0–46.0)
Hemoglobin: 14.6 g/dL (ref 12.0–15.0)
IMMATURE GRANULOCYTES: 0 %
LYMPHS ABS: 2.3 10*3/uL (ref 0.7–4.0)
Lymphocytes Relative: 28 %
MCH: 29.7 pg (ref 26.0–34.0)
MCHC: 31.1 g/dL (ref 30.0–36.0)
MCV: 95.7 fL (ref 78.0–100.0)
MONOS PCT: 6 %
Monocytes Absolute: 0.5 10*3/uL (ref 0.1–1.0)
NEUTROS PCT: 64 %
Neutro Abs: 5.2 10*3/uL (ref 1.7–7.7)
Platelets: 308 10*3/uL (ref 150–400)
RBC: 4.91 MIL/uL (ref 3.87–5.11)
RDW: 13.2 % (ref 11.5–15.5)
WBC: 8.2 10*3/uL (ref 4.0–10.5)

## 2018-03-02 LAB — TYPE AND SCREEN
ABO/RH(D): O POS
ANTIBODY SCREEN: NEGATIVE

## 2018-03-02 LAB — PROTIME-INR
INR: 0.99
Prothrombin Time: 13 seconds (ref 11.4–15.2)

## 2018-03-02 LAB — COMPREHENSIVE METABOLIC PANEL
ALBUMIN: 4 g/dL (ref 3.5–5.0)
ALT: 28 U/L (ref 0–44)
AST: 30 U/L (ref 15–41)
Alkaline Phosphatase: 100 U/L (ref 38–126)
Anion gap: 8 (ref 5–15)
BILIRUBIN TOTAL: 0.7 mg/dL (ref 0.3–1.2)
BUN: 9 mg/dL (ref 8–23)
CHLORIDE: 106 mmol/L (ref 98–111)
CO2: 27 mmol/L (ref 22–32)
Calcium: 9.7 mg/dL (ref 8.9–10.3)
Creatinine, Ser: 0.81 mg/dL (ref 0.44–1.00)
GFR calc Af Amer: >60 mL/min (ref >60)
GFR calc non Af Amer: >60 mL/min (ref >60)
GLUCOSE: 107 mg/dL — AB (ref 70–99)
POTASSIUM: 4.5 mmol/L (ref 3.5–5.1)
SODIUM: 141 mmol/L (ref 135–145)
TOTAL PROTEIN: 8 g/dL (ref 6.5–8.1)

## 2018-03-02 LAB — APTT: aPTT: 28 seconds (ref 24–36)

## 2018-03-02 LAB — SURGICAL PCR SCREEN
MRSA, PCR: POSITIVE — AB
Staphylococcus aureus: POSITIVE — AB

## 2018-03-02 LAB — ABO/RH: ABO/RH(D): O POS

## 2018-03-02 LAB — GLUCOSE, CAPILLARY: Glucose-Capillary: 110 mg/dL — ABNORMAL HIGH (ref 70–99)

## 2018-03-02 NOTE — Progress Notes (Signed)
Mupirocin Ointment Rx called into [facility] on [address] in [place] for positive PCR of MRSA and Staph. Pt notified of results and need to pick up Rx. She voiced understanding.

## 2018-03-02 NOTE — Progress Notes (Signed)
Pt has hx of enlarged heart, CHF and has a pacemaker. Pt denies any recent chest pain or sob. Pt's cardiologist is Dr. Cristopher Peru, pt has cardiac clearance noted in Epic on 01/24/18. Pt is a type 2 diabetic. Last A1C was 6.8 on 02/22/18. She states her fasting blood sugar is usually between 110-130.  Pt states she's had 2 heart caths in the past at [facility] in 2003 and a few years later. None in the last 10 years.

## 2018-03-03 ENCOUNTER — Other Ambulatory Visit: Payer: Self-pay | Admitting: Orthopedic Surgery

## 2018-03-03 ENCOUNTER — Encounter: Payer: Self-pay | Admitting: Family Medicine

## 2018-03-03 ENCOUNTER — Ambulatory Visit (INDEPENDENT_AMBULATORY_CARE_PROVIDER_SITE_OTHER): Payer: Medicare Other | Admitting: Family Medicine

## 2018-03-03 VITALS — BP 138/87 | HR 55 | Temp 97.9°F | Ht 61.5 in | Wt 227.0 lb

## 2018-03-03 DIAGNOSIS — Z22322 Carrier or suspected carrier of Methicillin resistant Staphylococcus aureus: Secondary | ICD-10-CM | POA: Diagnosis not present

## 2018-03-03 DIAGNOSIS — J029 Acute pharyngitis, unspecified: Secondary | ICD-10-CM | POA: Diagnosis not present

## 2018-03-03 LAB — POCT RAPID STREP A (OFFICE): Rapid Strep A Screen: NEGATIVE

## 2018-03-03 NOTE — Patient Instructions (Signed)
Thank you for coming in today. Continue the nasal ointment.  Use the chlorhexadine body wash as directed.  Use tylenol as needed for pain or sore throat.  Recheck with me or Dr Madilyn Fireman as needed.    Pharyngitis Pharyngitis is a sore throat (pharynx). There is redness, pain, and swelling of your throat. Follow these instructions at home:  Drink enough fluids to keep your pee (urine) clear or pale yellow.  Only take medicine as told by your doctor. ? You may get sick again if you do not take medicine as told. Finish your medicines, even if you start to feel better. ? Do not take aspirin.  Rest.  Rinse your mouth (gargle) with salt water ( tsp of salt per 1 qt of water) every 1-2 hours. This will help the pain.  If you are not at risk for choking, you can suck on hard candy or sore throat lozenges. Contact a doctor if:  You have large, tender lumps on your neck.  You have a rash.  You cough up green, yellow-brown, or bloody spit. Get help right away if:  You have a stiff neck.  You drool or cannot swallow liquids.  You throw up (vomit) or are not able to keep medicine or liquids down.  You have very bad pain that does not go away with medicine.  You have problems breathing (not from a stuffy nose). This information is not intended to replace advice given to you by your health care provider. Make sure you discuss any questions you have with your health care provider. Document Released: 01/27/2008 Document Revised: 01/16/2016 Document Reviewed: 04/17/2013 Elsevier Interactive Patient Education  2017 [employer].

## 2018-03-03 NOTE — Progress Notes (Signed)
Tanya Hogan is a 67 y.o. female who presents to [facility] today for MRSA colonization and pharyngitis.  Tanya Hogan is scheduled for total shoulder replacement in 1 week.  She had her preop anesthesia evaluation yesterday.  She again screen positive for MRSA colonization with nasal swab.  She notes that she is currently using the mupirocin antibiotic ointment and will plan on using the chlorhexidine body wash prior to surgery.  She denies any skin infections and feels well.  She denies significant nose pain.  However she does note a mild sore throat.  Symptoms are mild and have been ongoing for a few days.  She notes a mild occasional productive cough ongoing for years.  She denies severe fevers chills nausea vomiting diarrhea chest pain or palpitations.  She like to be double check to make sure she does not have strep throat prior to surgery.   ROS as above:  Exam:  BP 138/87   Pulse (!) 55   Temp 97.9 F (36.6 C)   Ht 5' 1.5" (1.562 m)   Wt 227 lb (103 kg)   BMI 42.20 kg/m  Gen: Well NAD HEENT: EOMI,  MMM posterior pharynx normal-appearing with no significant erythema.  No cervical lymphadenopathy present. Lungs: Normal work of breathing. CTABL Heart: RRR no MRG Abd: NABS, Soft. Nondistended, Nontender Exts: Brisk capillary refill, warm and well perfused.   Lab and Radiology Results Results for orders placed or performed in visit on 03/03/18 (from the past 72 hour(s))  POCT rapid strep A     Status: None   Collection Time: 03/03/18  8:31 AM  Result Value Ref Range   Rapid Strep A Screen Negative Negative   Dg Chest 2 View  Result Date: 03/02/2018 CLINICAL DATA:  Preop for shoulder surgery EXAM: CHEST - 2 VIEW COMPARISON:  CT right shoulder of 12/31/2015 and chest x-ray of 05/05/2017 FINDINGS: No active infiltrate or effusion is seen. Mediastinal and hilar  contours are unchanged. Cardiomegaly is stable and permanent pacemaker remains. No acute bony abnormality is seen. IMPRESSION: Stable cardiomegaly with permanent pacemaker. No active lung disease. Electronically Signed   By: Ivar Drape M.D.   On: 03/02/2018 14:08      Chemistry      Component Value Date/Time   NA 141 03/02/2018 1019   NA 142 10/30/2014 1119   K 4.5 03/02/2018 1019   K 4.5 10/30/2014 1119   CL 106 03/02/2018 1019   CL 106 09/23/2012 0947   CO2 27 03/02/2018 1019   CO2 25 10/30/2014 1119   BUN 9 03/02/2018 1019   BUN 11.8 10/30/2014 1119   CREATININE 0.81 03/02/2018 1019   CREATININE 0.78 02/22/2018 0941   CREATININE 0.8 10/30/2014 1119      Component Value Date/Time   CALCIUM 9.7 03/02/2018 1019   CALCIUM 9.1 10/30/2014 1119   ALKPHOS 100 03/02/2018 1019   ALKPHOS 97 10/30/2014 1119   AST 30 03/02/2018 1019   AST 56 (H) 10/30/2014 1119   ALT 28 03/02/2018 1019   ALT 43 10/30/2014 1119   BILITOT 0.7 03/02/2018 1019   BILITOT 0.36 10/30/2014 1119     Lab Results  Component Value Date   WBC 8.2 03/02/2018   HGB 14.6 03/02/2018   HCT 47.0 (H) 03/02/2018   MCV 95.7 03/02/2018   PLT 308 03/02/2018   Lab Results  Component Value Date   HGBA1C 6.8 (H) 02/22/2018  Assessment and Plan: 67 y.o. female with  Pharyngitis likely allergic.  Plan for watchful waiting with symptomatic control.  I believe patient is optimized for surgery and I do not foresee her pharyngitis causing any problems.  MRSA colonization: Agree with current plan.  Recheck if needed.  Follow-up with PCP.   Orders Placed This Encounter  Procedures  . POCT rapid strep A   No orders of the defined types were placed in this encounter.    Historical information moved to improve visibility of documentation.  Past Medical History:  Diagnosis Date  . Alkaline phosphatase elevation   . Asthma   . Breast cancer ([place]) 2011  . Carpal tunnel syndrome   . Carpal tunnel syndrome     bilateral  . CHF (congestive heart failure) ([place])   . Cirrhosis, non-alcoholic ([place])   . Closed fracture of unspecified part of radius (alone)   . DDD (degenerative disc disease), lumbar   . Depression    pt denies  . Diabetes mellitus without complication ([place])   . Enlarged heart   . Fibromyalgia   . GERD (gastroesophageal reflux disease)   . History of kidney stones   . HTN (hypertension)   . Hypothyroidism   . IBS (irritable bowel syndrome)   . LBBB (left bundle branch block)   . Microcytic anemia 05/14/2017  . Pneumonia   . Presence of permanent cardiac pacemaker   . Sleep apnea    uses cpap   Past Surgical History:  Procedure Laterality Date  . BREAST LUMPECTOMY Left 2011  . COLONOSCOPY    . FOOT SURGERY    . KNEE SURGERY    . PACEMAKER GENERATOR CHANGE N/A 08/23/2014   Procedure: PACEMAKER GENERATOR CHANGE;  Surgeon: Evans Lance, MD;  Location: [facility] CATH LAB;  Service: Cardiovascular;  Laterality: N/A;  . PACEMAKER PLACEMENT  2009   [place] [facility]  . RECTAL SURGERY  1976  . TUBAL LIGATION     Social History   Tobacco Use  . Smoking status: Former Smoker    Last attempt to quit: 08/24/1966    Years since quitting: 51.5  . Smokeless tobacco: Never Used  Substance Use Topics  . Alcohol use: No   family history includes Breast cancer in her unknown relative; Heart failure in her unknown relative; Hypertension in her unknown relative; Melanoma in her mother.  Medications: Current Outpatient Medications  Medication Sig Dispense Refill  . ACCU-CHEK FASTCLIX LANCETS MISC Test twice a day 204 each 11  . ACCU-CHEK SMARTVIEW test strip TEST TWO TIMES DAILY 200 each 3  . acetaminophen (TYLENOL) 500 MG tablet Take 500 mg by mouth 2 (two) times daily as needed for moderate pain.    [name] albuterol (PROVENTIL HFA;VENTOLIN HFA) 108 (90 Base) MCG/ACT inhaler Inhale 2 puffs into the lungs every 4 (four) hours as needed for wheezing or shortness of breath.    [name] alendronate  (FOSAMAX) 70 MG tablet Take 1 tablet (70 mg total) by mouth every 7 (seven) days. Take with a full glass of water on an empty stomach. 12 tablet 4  . AMBULATORY NON FORMULARY MEDICATION Medication Name: Needs tubing and suplies for her nebulizer machine. Dx COPD. [facility] 1 vial 0  . AMBULATORY NON FORMULARY MEDICATION Fastclix lancets Dx: E11.9 300 each 3  . AMBULATORY NON FORMULARY MEDICATION Medication Name:[employer] Footwear. Dx: E11.8 DM type 2; M79.671,M79.672 Bilateral foot pain. 1 each 0  . AMBULATORY NON FORMULARY MEDICATION Rolling 4 post [place] with a seat  1 each 0  . BD PEN NEEDLE NANO U/F 32G X 4 MM MISC Test two times daily 180 each 11  . Blood Glucose Monitoring Suppl (ACCU-CHEK NANO SMARTVIEW) w/Device KIT To be used to test blood sugars two times daily. Dx:E11.8 1 kit 0  . bumetanide (BUMEX) 1 MG tablet TAKE 1 TABLET(1 MG) BY MOUTH TWICE DAILY 180 tablet 1  . BYSTOLIC 10 MG tablet TAKE 1 TABLET BY MOUTH  DAILY 90 tablet 1  . Calcium Carb-Cholecalciferol (CALCIUM 500/D) 500-400 MG-UNIT CHEW Chew 600 mg by mouth 2 (two) times daily.    . clotrimazole-betamethasone (LOTRISONE) cream Apply 1 application topically 2 (two) times daily as needed.     . dicyclomine (BENTYL) 20 MG tablet Take 10-20 mg by mouth every 8 (eight) hours as needed for spasms (cramps).   11  . diltiazem (CARDIZEM) 120 MG tablet Take 1 tablet (120 mg total) by mouth daily. 90 tablet 3  . diphenhydrAMINE (BENADRYL) 25 MG tablet Take 25 mg by mouth daily as needed for allergies.    [name] EPIPEN 2-PAK 0.3 MG/0.3ML SOAJ injection INJECT 1 PEN INTO MUSCLE OF LATERAL THIGH FOR SEVERE ALLERGIC REACTION. CALL 911 AFTER USING THIS MEDICATION.  2  . Fluticasone-Salmeterol (ADVAIR) 250-50 MCG/DOSE AEPB Inhale 1 puff into the lungs 2 (two) times daily.    [name] gabapentin (NEURONTIN) 100 MG capsule TAKE 1 TO 2 CAPSULES(100 TO 200 MG) BY MOUTH AT BEDTIME (Patient taking differently: Take 100 - 200 mg at bedtime as needed for  pain) 60 capsule 3  . insulin glargine (LANTUS) 100 unit/mL SOPN Inject 0.4 mLs (40 Units total) into the skin at bedtime. Place on hold 10 pen 1  . levalbuterol (XOPENEX) 1.25 MG/3ML nebulizer solution Take 1.25 mg by nebulization every 8 (eight) hours as needed for wheezing or shortness of breath.   2  . levothyroxine (SYNTHROID, LEVOTHROID) 75 MCG tablet TAKE 1 TABLET BY MOUTH  DAILY 90 tablet 1  . Magnesium 500 MG TABS Take 500 mg by mouth daily as needed (spasms).    . Multiple Vitamin (MULTIVITAMIN WITH MINERALS) TABS tablet Take 1 tablet by mouth daily.    . Multiple Vitamins-Minerals (OCUVITE EYE + MULTI) TABS Take 1 tablet by mouth daily.    [name] nystatin cream (MYCOSTATIN) Apply 1 application topically 2 (two) times daily. (Patient taking differently: Apply 1 application topically 2 (two) times daily as needed (rash). ) 60 g 1  . pantoprazole (PROTONIX) 40 MG tablet TAKE 1 TABLET BY MOUTH  EVERY MORNING AS DIRECTED    . pravastatin (PRAVACHOL) 40 MG tablet Take 1 tablet (40 mg total) by mouth at bedtime. 30 tablet 1  . senna-docusate (SENOKOT-S) 8.6-50 MG tablet Take 1 tablet by mouth daily as needed for mild constipation.    [name] tiotropium (SPIRIVA) 18 MCG inhalation capsule Place 18 mcg into inhaler and inhale daily. Inhale the contents of 1 capsule via handhaler     No current facility-administered medications for this visit.    Allergies  Allergen Reactions  . Penicillins Anaphylaxis    Has patient had a PCN reaction causing immediate rash, facial/tongue/throat swelling, SOB or lightheadedness with hypotension: Yes Has patient had a PCN reaction causing severe rash involving mucus membranes or skin necrosis: No Has patient had a PCN reaction that required hospitalization: No Has patient had a PCN reaction occurring within the last 10 years: No If all of the above answers are "NO", then may proceed with Cephalosporin use.   . Naproxen  Swelling  . Accupril [Quinapril Hcl] Other (See  Comments)    Hyperkalemia  . Aspirin Other (See Comments)    Pt states "burns holes in stomach", ulcers   . Atorvastatin Other (See Comments)    msucle aches  . Baking Soda-Fluoride [Sodium Fluoride] Itching  . Celebrex [Celecoxib]     ulcers  . Codeine Nausea And Vomiting  . Fluticasone Other (See Comments)    Nasal ulcer.    . Furosemide Other (See Comments)    Nose bleed  . Lactose Intolerance (Gi) Other (See Comments)    Increased calcium, sneezing, nasal issues  . Levocetirizine     Doesn't recall  . Losartan Potassium Itching  . Metformin And Related Diarrhea  . Nasonex [Mometasone Furoate] Other (See Comments)    nosebleed  . Rifampin Other (See Comments)    DOES NOT REMEMBER THIS MEDICATION OR ALLERGY  . Clindamycin/Lincomycin Rash  . Iodinated Diagnostic Agents Itching and Rash    Pt states she has had ultrasound with dye but was fine  . Lanolin-Petrolatum Rash  . Miralax [Polyethylene Glycol] Rash  . Quinine Rash  . Sulfa Antibiotics Rash  . Sulfonamide Derivatives Rash  . Tramadol Rash    Anxiety  . Wool Alcohol [Lanolin] Rash  . Zoledronic Acid Other (See Comments) and Rash    shaking     Discussed warning signs or symptoms. Please see discharge instructions. Patient expresses understanding.

## 2018-03-04 ENCOUNTER — Other Ambulatory Visit: Payer: Self-pay | Admitting: Orthopedic Surgery

## 2018-03-08 DIAGNOSIS — G4733 Obstructive sleep apnea (adult) (pediatric): Secondary | ICD-10-CM | POA: Diagnosis not present

## 2018-03-08 DIAGNOSIS — Z9989 Dependence on other enabling machines and devices: Secondary | ICD-10-CM | POA: Diagnosis not present

## 2018-03-09 ENCOUNTER — Encounter (HOSPITAL_COMMUNITY): Payer: Self-pay | Admitting: Certified Registered Nurse Anesthetist

## 2018-03-09 MED ORDER — TRANEXAMIC ACID 1000 MG/10ML IV SOLN
1000.0000 mg | INTRAVENOUS | Status: AC
  Administered 2018-03-10: 1000 mg via INTRAVENOUS
  Filled 2018-03-09: qty 1100

## 2018-03-09 MED ORDER — VANCOMYCIN HCL 10 G IV SOLR
1500.0000 mg | INTRAVENOUS | Status: AC
  Administered 2018-03-10 (×2): 1500 mg via INTRAVENOUS
  Filled 2018-03-09: qty 1500

## 2018-03-09 NOTE — Anesthesia Preprocedure Evaluation (Addendum)
Anesthesia Evaluation  Patient identified by MRN, date of birth, ID band Patient awake    Reviewed: Allergy & Precautions, NPO status , Patient's Chart, lab work & pertinent test results  Airway Mallampati: III  TM Distance: >3 FB Neck ROM: Full    Dental  (+) Teeth Intact, Dental Advisory Given   Pulmonary asthma , sleep apnea and Continuous Positive Airway Pressure Ventilation , former smoker,    breath sounds clear to auscultation       Cardiovascular hypertension, Pt. on medications and Pt. on home beta blockers +CHF  + dysrhythmias + pacemaker  Rhythm:Regular Rate:Normal     Neuro/Psych PSYCHIATRIC DISORDERS Depression  Neuromuscular disease    GI/Hepatic GERD  Medicated,(+) Cirrhosis       , Hepatitis -, Toxin Related  Endo/Other  diabetes, Type 2, Insulin DependentHypothyroidism   Renal/GU      Musculoskeletal   Abdominal (+) + obese,   Peds  Hematology   Anesthesia Other Findings - LBBB  Reproductive/Obstetrics                            Anesthesia Physical Anesthesia Plan  ASA: III  Anesthesia Plan: General   Post-op Pain Management: GA combined w/ Regional for post-op pain   Induction: Intravenous  PONV Risk Score and Plan: 4 or greater and Ondansetron, Dexamethasone, Midazolam and Scopolamine patch - Pre-op  Airway Management Planned: Oral ETT  Additional Equipment: None  Intra-op Plan:   Post-operative Plan: Extubation in OR  Informed Consent: I have reviewed the patients History and Physical, chart, labs and discussed the procedure including the risks, benefits and alternatives for the proposed anesthesia with the patient or authorized representative who has indicated his/her understanding and acceptance.   Dental advisory given  Plan Discussed with: CRNA  Anesthesia Plan Comments:        Anesthesia Quick Evaluation

## 2018-03-10 ENCOUNTER — Encounter (HOSPITAL_COMMUNITY)
Admission: RE | Disposition: A | Discharge status: Home / Self Care | Payer: Self-pay | Source: Ambulatory Visit | Attending: Orthopedic Surgery

## 2018-03-10 ENCOUNTER — Inpatient Hospital Stay (HOSPITAL_COMMUNITY)
Admission: RE | Admit: 2018-03-10 | Discharge: 2018-03-11 | DRG: 483 | Disposition: A | Discharge status: Home / Self Care | Payer: Medicare Other | Source: Ambulatory Visit | Attending: Orthopedic Surgery | Admitting: Orthopedic Surgery

## 2018-03-10 ENCOUNTER — Inpatient Hospital Stay (HOSPITAL_COMMUNITY): Payer: Medicare Other

## 2018-03-10 ENCOUNTER — Inpatient Hospital Stay (HOSPITAL_COMMUNITY): Payer: Medicare Other | Admitting: Certified Registered Nurse Anesthetist

## 2018-03-10 ENCOUNTER — Other Ambulatory Visit: Payer: Self-pay

## 2018-03-10 ENCOUNTER — Encounter (HOSPITAL_COMMUNITY): Payer: Self-pay

## 2018-03-10 DIAGNOSIS — M19011 Primary osteoarthritis, right shoulder: Principal | ICD-10-CM | POA: Diagnosis present

## 2018-03-10 DIAGNOSIS — M797 Fibromyalgia: Secondary | ICD-10-CM | POA: Diagnosis not present

## 2018-03-10 DIAGNOSIS — Z7951 Long term (current) use of inhaled steroids: Secondary | ICD-10-CM | POA: Diagnosis not present

## 2018-03-10 DIAGNOSIS — Z853 Personal history of malignant neoplasm of breast: Secondary | ICD-10-CM

## 2018-03-10 DIAGNOSIS — Z950 Presence of cardiac pacemaker: Secondary | ICD-10-CM

## 2018-03-10 DIAGNOSIS — Z888 Allergy status to other drugs, medicaments and biological substances status: Secondary | ICD-10-CM | POA: Diagnosis not present

## 2018-03-10 DIAGNOSIS — Z880 Allergy status to penicillin: Secondary | ICD-10-CM | POA: Diagnosis not present

## 2018-03-10 DIAGNOSIS — Z886 Allergy status to analgesic agent status: Secondary | ICD-10-CM | POA: Diagnosis not present

## 2018-03-10 DIAGNOSIS — K219 Gastro-esophageal reflux disease without esophagitis: Secondary | ICD-10-CM | POA: Diagnosis not present

## 2018-03-10 DIAGNOSIS — I10 Essential (primary) hypertension: Secondary | ICD-10-CM | POA: Diagnosis not present

## 2018-03-10 DIAGNOSIS — I447 Left bundle-branch block, unspecified: Secondary | ICD-10-CM | POA: Diagnosis not present

## 2018-03-10 DIAGNOSIS — Z885 Allergy status to narcotic agent status: Secondary | ICD-10-CM

## 2018-03-10 DIAGNOSIS — G4730 Sleep apnea, unspecified: Secondary | ICD-10-CM | POA: Diagnosis present

## 2018-03-10 DIAGNOSIS — K7460 Unspecified cirrhosis of liver: Secondary | ICD-10-CM | POA: Diagnosis not present

## 2018-03-10 DIAGNOSIS — Z881 Allergy status to other antibiotic agents status: Secondary | ICD-10-CM | POA: Diagnosis not present

## 2018-03-10 DIAGNOSIS — Z96611 Presence of right artificial shoulder joint: Secondary | ICD-10-CM | POA: Diagnosis not present

## 2018-03-10 DIAGNOSIS — J45909 Unspecified asthma, uncomplicated: Secondary | ICD-10-CM | POA: Diagnosis not present

## 2018-03-10 DIAGNOSIS — Z79899 Other long term (current) drug therapy: Secondary | ICD-10-CM

## 2018-03-10 DIAGNOSIS — Z882 Allergy status to sulfonamides status: Secondary | ICD-10-CM | POA: Diagnosis not present

## 2018-03-10 DIAGNOSIS — E669 Obesity, unspecified: Secondary | ICD-10-CM | POA: Diagnosis present

## 2018-03-10 DIAGNOSIS — Z87891 Personal history of nicotine dependence: Secondary | ICD-10-CM

## 2018-03-10 DIAGNOSIS — E039 Hypothyroidism, unspecified: Secondary | ICD-10-CM | POA: Diagnosis not present

## 2018-03-10 DIAGNOSIS — Z794 Long term (current) use of insulin: Secondary | ICD-10-CM

## 2018-03-10 DIAGNOSIS — E119 Type 2 diabetes mellitus without complications: Secondary | ICD-10-CM | POA: Diagnosis present

## 2018-03-10 DIAGNOSIS — G8918 Other acute postprocedural pain: Secondary | ICD-10-CM | POA: Diagnosis not present

## 2018-03-10 DIAGNOSIS — Z6841 Body Mass Index (BMI) 40.0 and over, adult: Secondary | ICD-10-CM | POA: Diagnosis not present

## 2018-03-10 DIAGNOSIS — Z471 Aftercare following joint replacement surgery: Secondary | ICD-10-CM | POA: Diagnosis not present

## 2018-03-10 HISTORY — PX: TOTAL SHOULDER ARTHROPLASTY: SHX126

## 2018-03-10 LAB — GLUCOSE, CAPILLARY
GLUCOSE-CAPILLARY: 143 mg/dL — AB (ref 70–99)
GLUCOSE-CAPILLARY: 170 mg/dL — AB (ref 70–99)
GLUCOSE-CAPILLARY: 193 mg/dL — AB (ref 70–99)
Glucose-Capillary: 157 mg/dL — ABNORMAL HIGH (ref 70–99)
Glucose-Capillary: 170 mg/dL — ABNORMAL HIGH (ref 70–99)

## 2018-03-10 SURGERY — ARTHROPLASTY, SHOULDER, TOTAL
Anesthesia: General | Site: Shoulder | Laterality: Right

## 2018-03-10 MED ORDER — FENTANYL CITRATE (PF) 250 MCG/5ML IJ SOLN
INTRAMUSCULAR | Status: AC
Start: 2018-03-10 — End: ?
  Filled 2018-03-10: qty 5

## 2018-03-10 MED ORDER — METOCLOPRAMIDE HCL 5 MG/ML IJ SOLN: 5.0000 mg | Freq: Three times a day (TID) | INTRAMUSCULAR | Status: DC | PRN

## 2018-03-10 MED ORDER — PANTOPRAZOLE SODIUM 20 MG PO TBEC
20.0000 mg | DELAYED_RELEASE_TABLET | Freq: Every morning | ORAL | Status: DC
  Administered 2018-03-11: 20 mg via ORAL
  Filled 2018-03-10: qty 1

## 2018-03-10 MED ORDER — TIOTROPIUM BROMIDE MONOHYDRATE 18 MCG IN CAPS
18.0000 ug | ORAL_CAPSULE | Freq: Every day | RESPIRATORY_TRACT | Status: DC
  Administered 2018-03-11: 18 ug via RESPIRATORY_TRACT
  Filled 2018-03-10: qty 5

## 2018-03-10 MED ORDER — DEXAMETHASONE SODIUM PHOSPHATE 10 MG/ML IJ SOLN
INTRAMUSCULAR | Status: AC
  Filled 2018-03-10: qty 1

## 2018-03-10 MED ORDER — FENTANYL CITRATE (PF) 100 MCG/2ML IJ SOLN
25.0000 ug | INTRAMUSCULAR | Status: DC | PRN
  Administered 2018-03-10: 50 ug via INTRAVENOUS

## 2018-03-10 MED ORDER — ASPIRIN EC 81 MG PO TBEC: 81.0000 mg | DELAYED_RELEASE_TABLET | Freq: Two times a day (BID) | ORAL | Status: DC

## 2018-03-10 MED ORDER — MEPERIDINE HCL 50 MG/ML IJ SOLN: 6.2500 mg | INTRAMUSCULAR | Status: DC | PRN

## 2018-03-10 MED ORDER — METHOCARBAMOL 1000 MG/10ML IJ SOLN: 500.0000 mg | Freq: Four times a day (QID) | INTRAVENOUS | Status: DC | PRN

## 2018-03-10 MED ORDER — PROMETHAZINE HCL 25 MG/ML IJ SOLN: 6.2500 mg | INTRAMUSCULAR | Status: DC | PRN

## 2018-03-10 MED ORDER — ZOLPIDEM TARTRATE 5 MG PO TABS
5.0000 mg | ORAL_TABLET | Freq: Every evening | ORAL | Status: DC | PRN
Start: 2018-03-10 — End: 2018-03-11
  Filled 2018-03-10: qty 1

## 2018-03-10 MED ORDER — OXYCODONE-ACETAMINOPHEN 5-325 MG PO TABS: 1.0000 | ORAL_TABLET | ORAL | 0 refills | Status: DC | PRN

## 2018-03-10 MED ORDER — BISACODYL 10 MG RE SUPP: 10.0000 mg | Freq: Every day | RECTAL | Status: DC | PRN

## 2018-03-10 MED ORDER — METHOCARBAMOL 500 MG PO TABS
500.0000 mg | ORAL_TABLET | Freq: Four times a day (QID) | ORAL | Status: DC | PRN
  Administered 2018-03-11: 500 mg via ORAL
  Filled 2018-03-10: qty 1

## 2018-03-10 MED ORDER — HYDROMORPHONE HCL 1 MG/ML IJ SOLN
0.5000 mg | INTRAMUSCULAR | Status: DC | PRN
  Administered 2018-03-10 – 2018-03-11 (×3): 1 mg via INTRAVENOUS
  Filled 2018-03-10 (×4): qty 1

## 2018-03-10 MED ORDER — ALUMINUM HYDROXIDE GEL 320 MG/5ML PO SUSP
15.0000 mL | ORAL | Status: DC | PRN
  Administered 2018-03-11: 30 mL via ORAL
  Filled 2018-03-10 (×3): qty 30

## 2018-03-10 MED ORDER — GLYCOPYRROLATE PF 0.2 MG/ML IJ SOSY
PREFILLED_SYRINGE | INTRAMUSCULAR | Status: DC | PRN
  Administered 2018-03-10: 0.6 mg via INTRAVENOUS

## 2018-03-10 MED ORDER — PHENOL 1.4 % MT LIQD: 1.0000 | OROMUCOSAL | Status: DC | PRN

## 2018-03-10 MED ORDER — BUPIVACAINE LIPOSOME 1.3 % IJ SUSP
INTRAMUSCULAR | Status: DC | PRN
  Administered 2018-03-10: 10 mL via PERINEURAL

## 2018-03-10 MED ORDER — ACETAMINOPHEN 500 MG PO TABS
1000.0000 mg | ORAL_TABLET | Freq: Four times a day (QID) | ORAL | Status: AC
  Administered 2018-03-10 – 2018-03-11 (×3): 1000 mg via ORAL
  Filled 2018-03-10 (×3): qty 2

## 2018-03-10 MED ORDER — ROCURONIUM BROMIDE 10 MG/ML (PF) SYRINGE
PREFILLED_SYRINGE | INTRAVENOUS | Status: DC | PRN
  Administered 2018-03-10: 50 mg via INTRAVENOUS

## 2018-03-10 MED ORDER — PROPOFOL 10 MG/ML IV BOLUS
INTRAVENOUS | Status: DC | PRN
  Administered 2018-03-10: 150 mg via INTRAVENOUS
  Administered 2018-03-10: 20 mg via INTRAVENOUS

## 2018-03-10 MED ORDER — POVIDONE-IODINE 7.5 % EX SOLN
Freq: Once | CUTANEOUS | Status: DC
  Filled 2018-03-10: qty 118

## 2018-03-10 MED ORDER — MENTHOL 3 MG MT LOZG: 1.0000 | LOZENGE | OROMUCOSAL | Status: DC | PRN

## 2018-03-10 MED ORDER — ONDANSETRON HCL 4 MG/2ML IJ SOLN
INTRAMUSCULAR | Status: AC
  Filled 2018-03-10: qty 2

## 2018-03-10 MED ORDER — PANTOPRAZOLE SODIUM 40 MG PO TBEC
DELAYED_RELEASE_TABLET | ORAL | Status: AC
  Administered 2018-03-10: 17:00:00
  Filled 2018-03-10: qty 1

## 2018-03-10 MED ORDER — SODIUM CHLORIDE 0.9 % IV SOLN
INTRAVENOUS | Status: DC | PRN
  Administered 2018-03-10: 25 ug/min via INTRAVENOUS

## 2018-03-10 MED ORDER — DICYCLOMINE HCL 20 MG PO TABS: 10.0000 mg | ORAL_TABLET | Freq: Three times a day (TID) | ORAL | Status: DC | PRN

## 2018-03-10 MED ORDER — MIDAZOLAM HCL 2 MG/2ML IJ SOLN
INTRAMUSCULAR | Status: AC
  Filled 2018-03-10: qty 2

## 2018-03-10 MED ORDER — CLINDAMYCIN PHOSPHATE 600 MG/50ML IV SOLN
600.0000 mg | Freq: Four times a day (QID) | INTRAVENOUS | Status: DC
  Filled 2018-03-10: qty 50

## 2018-03-10 MED ORDER — INSULIN GLARGINE 100 UNIT/ML ~~LOC~~ SOLN
40.0000 [IU] | Freq: Every day | SUBCUTANEOUS | Status: DC
  Administered 2018-03-10: 40 [IU] via SUBCUTANEOUS
  Filled 2018-03-10 (×2): qty 0.4

## 2018-03-10 MED ORDER — ROCURONIUM BROMIDE 10 MG/ML (PF) SYRINGE
PREFILLED_SYRINGE | INTRAVENOUS | Status: AC
  Filled 2018-03-10: qty 10

## 2018-03-10 MED ORDER — INSULIN GLARGINE 100 UNITS/ML SOLOSTAR PEN
40.0000 [IU] | PEN_INJECTOR | Freq: Every day | SUBCUTANEOUS | Status: DC
  Filled 2018-03-10: qty 3

## 2018-03-10 MED ORDER — ONDANSETRON HCL 4 MG PO TABS: 4.0000 mg | ORAL_TABLET | Freq: Every day | ORAL | 1 refills | Status: DC | PRN

## 2018-03-10 MED ORDER — SCOPOLAMINE 1 MG/3DAYS TD PT72
MEDICATED_PATCH | TRANSDERMAL | Status: DC | PRN
  Administered 2018-03-10: 1 via TRANSDERMAL

## 2018-03-10 MED ORDER — ACETAMINOPHEN 325 MG PO TABS
325.0000 mg | ORAL_TABLET | Freq: Four times a day (QID) | ORAL | Status: DC | PRN
  Administered 2018-03-11: 650 mg via ORAL
  Filled 2018-03-10 (×2): qty 2

## 2018-03-10 MED ORDER — ONDANSETRON HCL 4 MG/2ML IJ SOLN: 4.0000 mg | Freq: Four times a day (QID) | INTRAMUSCULAR | Status: DC | PRN

## 2018-03-10 MED ORDER — DOCUSATE SODIUM 100 MG PO CAPS
100.0000 mg | ORAL_CAPSULE | Freq: Two times a day (BID) | ORAL | Status: DC
  Administered 2018-03-10: 100 mg via ORAL
  Filled 2018-03-10 (×2): qty 1

## 2018-03-10 MED ORDER — INSULIN ASPART 100 UNIT/ML ~~LOC~~ SOLN
0.0000 [IU] | Freq: Three times a day (TID) | SUBCUTANEOUS | Status: DC
  Administered 2018-03-10: 3 [IU] via SUBCUTANEOUS
  Administered 2018-03-11: 2 [IU] via SUBCUTANEOUS
  Administered 2018-03-11: 3 [IU] via SUBCUTANEOUS

## 2018-03-10 MED ORDER — LACTATED RINGERS IV SOLN: INTRAVENOUS | Status: DC

## 2018-03-10 MED ORDER — PRAVASTATIN SODIUM 40 MG PO TABS
40.0000 mg | ORAL_TABLET | Freq: Every day | ORAL | Status: DC
  Administered 2018-03-10: 40 mg via ORAL
  Filled 2018-03-10: qty 1

## 2018-03-10 MED ORDER — METHOCARBAMOL 500 MG PO TABS: 500.0000 mg | ORAL_TABLET | Freq: Three times a day (TID) | ORAL | 0 refills | Status: DC

## 2018-03-10 MED ORDER — OXYCODONE HCL 5 MG PO TABS
10.0000 mg | ORAL_TABLET | ORAL | Status: DC | PRN
  Filled 2018-03-10: qty 3

## 2018-03-10 MED ORDER — DILTIAZEM HCL ER COATED BEADS 120 MG PO CP24
120.0000 mg | ORAL_CAPSULE | Freq: Every day | ORAL | Status: DC
  Administered 2018-03-11: 120 mg via ORAL
  Filled 2018-03-10 (×2): qty 1

## 2018-03-10 MED ORDER — NEOSTIGMINE METHYLSULFATE 5 MG/5ML IV SOSY
PREFILLED_SYRINGE | INTRAVENOUS | Status: DC | PRN
  Administered 2018-03-10: 4 mg via INTRAVENOUS

## 2018-03-10 MED ORDER — LACTATED RINGERS IV SOLN
INTRAVENOUS | Status: DC | PRN
  Administered 2018-03-10: 07:00:00 via INTRAVENOUS

## 2018-03-10 MED ORDER — ONDANSETRON HCL 4 MG PO TABS: 4.0000 mg | ORAL_TABLET | Freq: Four times a day (QID) | ORAL | Status: DC | PRN

## 2018-03-10 MED ORDER — FLEET ENEMA 7-19 GM/118ML RE ENEM: 1.0000 | ENEMA | Freq: Once | RECTAL | Status: DC | PRN

## 2018-03-10 MED ORDER — LEVOTHYROXINE SODIUM 75 MCG PO TABS
75.0000 ug | ORAL_TABLET | Freq: Every day | ORAL | Status: DC
  Administered 2018-03-11: 75 ug via ORAL
  Filled 2018-03-10: qty 1

## 2018-03-10 MED ORDER — GABAPENTIN 100 MG PO CAPS: 200.0000 mg | ORAL_CAPSULE | Freq: Every day | ORAL | Status: DC | PRN

## 2018-03-10 MED ORDER — CLINDAMYCIN PHOSPHATE 900 MG/50ML IV SOLN: 900.0000 mg | Freq: Once | INTRAVENOUS | Status: DC

## 2018-03-10 MED ORDER — PROPOFOL 10 MG/ML IV BOLUS
INTRAVENOUS | Status: AC
  Filled 2018-03-10: qty 40

## 2018-03-10 MED ORDER — OXYCODONE HCL 5 MG PO TABS
5.0000 mg | ORAL_TABLET | ORAL | Status: DC | PRN
  Administered 2018-03-10: 10 mg via ORAL
  Filled 2018-03-10: qty 2

## 2018-03-10 MED ORDER — LIDOCAINE 2% (20 MG/ML) 5 ML SYRINGE
INTRAMUSCULAR | Status: DC | PRN
  Administered 2018-03-10: 60 mg via INTRAVENOUS

## 2018-03-10 MED ORDER — SODIUM CHLORIDE 0.9 % IR SOLN
Status: DC | PRN
  Administered 2018-03-10: 3000 mL

## 2018-03-10 MED ORDER — LIDOCAINE 2% (20 MG/ML) 5 ML SYRINGE
INTRAMUSCULAR | Status: AC
  Filled 2018-03-10: qty 5

## 2018-03-10 MED ORDER — ONDANSETRON HCL 4 MG/2ML IJ SOLN
INTRAMUSCULAR | Status: DC | PRN
  Administered 2018-03-10: 4 mg via INTRAVENOUS

## 2018-03-10 MED ORDER — 0.9 % SODIUM CHLORIDE (POUR BTL) OPTIME
TOPICAL | Status: DC | PRN
  Administered 2018-03-10: 1000 mL

## 2018-03-10 MED ORDER — METOCLOPRAMIDE HCL 5 MG PO TABS: 5.0000 mg | ORAL_TABLET | Freq: Three times a day (TID) | ORAL | Status: DC | PRN

## 2018-03-10 MED ORDER — SENNOSIDES-DOCUSATE SODIUM 8.6-50 MG PO TABS: 1.0000 | ORAL_TABLET | Freq: Every evening | ORAL | Status: DC | PRN

## 2018-03-10 MED ORDER — BUPIVACAINE HCL (PF) 0.5 % IJ SOLN
INTRAMUSCULAR | Status: DC | PRN
  Administered 2018-03-10: 15 mL via PERINEURAL

## 2018-03-10 MED ORDER — DOCUSATE SODIUM 100 MG PO CAPS
ORAL_CAPSULE | ORAL | Status: AC
  Filled 2018-03-10: qty 1

## 2018-03-10 MED ORDER — NEBIVOLOL HCL 10 MG PO TABS
10.0000 mg | ORAL_TABLET | Freq: Every day | ORAL | Status: DC
  Administered 2018-03-11: 10 mg via ORAL
  Filled 2018-03-10: qty 1

## 2018-03-10 MED ORDER — SCOPOLAMINE 1 MG/3DAYS TD PT72
MEDICATED_PATCH | TRANSDERMAL | Status: AC
  Filled 2018-03-10: qty 1

## 2018-03-10 MED ORDER — DIPHENHYDRAMINE HCL 12.5 MG/5ML PO ELIX: 12.5000 mg | ORAL_SOLUTION | ORAL | Status: DC | PRN

## 2018-03-10 MED ORDER — BUMETANIDE 1 MG PO TABS
1.0000 mg | ORAL_TABLET | Freq: Every day | ORAL | Status: DC
  Administered 2018-03-10 – 2018-03-11 (×2): 1 mg via ORAL
  Filled 2018-03-10 (×2): qty 1

## 2018-03-10 MED ORDER — FENTANYL CITRATE (PF) 100 MCG/2ML IJ SOLN
INTRAMUSCULAR | Status: DC | PRN
  Administered 2018-03-10: 50 ug via INTRAVENOUS

## 2018-03-10 MED ORDER — ALBUTEROL SULFATE (5 MG/ML) 0.5% IN NEBU: 2.5000 mg | INHALATION_SOLUTION | Freq: Three times a day (TID) | RESPIRATORY_TRACT | Status: DC | PRN

## 2018-03-10 MED ORDER — MIDAZOLAM HCL 5 MG/5ML IJ SOLN
INTRAMUSCULAR | Status: DC | PRN
  Administered 2018-03-10: 1 mg via INTRAVENOUS

## 2018-03-10 MED ORDER — ALBUTEROL SULFATE (2.5 MG/3ML) 0.083% IN NEBU: 2.5000 mg | INHALATION_SOLUTION | RESPIRATORY_TRACT | Status: DC | PRN

## 2018-03-10 MED ORDER — MOMETASONE FURO-FORMOTEROL FUM 200-5 MCG/ACT IN AERO
2.0000 | INHALATION_SPRAY | Freq: Two times a day (BID) | RESPIRATORY_TRACT | Status: DC
  Administered 2018-03-10 – 2018-03-11 (×2): 2 via RESPIRATORY_TRACT
  Filled 2018-03-10: qty 8.8

## 2018-03-10 MED ORDER — ALBUTEROL SULFATE HFA 108 (90 BASE) MCG/ACT IN AERS
INHALATION_SPRAY | RESPIRATORY_TRACT | Status: DC | PRN
  Administered 2018-03-10: 5 via RESPIRATORY_TRACT

## 2018-03-10 MED ORDER — SODIUM CHLORIDE 0.9 % IV SOLN
INTRAVENOUS | Status: AC
  Administered 2018-03-10: 125 mL/h via INTRAVENOUS

## 2018-03-10 MED ORDER — FENTANYL CITRATE (PF) 100 MCG/2ML IJ SOLN
INTRAMUSCULAR | Status: AC
  Filled 2018-03-10: qty 2

## 2018-03-10 MED ORDER — DEXAMETHASONE SODIUM PHOSPHATE 10 MG/ML IJ SOLN
INTRAMUSCULAR | Status: DC | PRN
  Administered 2018-03-10: 10 mg via INTRAVENOUS

## 2018-03-10 SURGICAL SUPPLY — 71 items
BIT DRILL 5/64X5 DISP (BIT) ×3 IMPLANT
BLADE SAW SAG 73X25 THK (BLADE) ×2
BLADE SAW SGTL 73X25 THK (BLADE) ×1 IMPLANT
BLADE SURG 15 STRL LF DISP TIS (BLADE) ×1 IMPLANT
BLADE SURG 15 STRL SS (BLADE) ×2
CEMENT BONE DEPUY (Cement) ×3 IMPLANT
CHLORAPREP W/TINT 26ML (MISCELLANEOUS) ×6 IMPLANT
CLOSURE WOUND 1/2 X4 (GAUZE/BANDAGES/DRESSINGS) ×1
COVER SURGICAL LIGHT HANDLE (MISCELLANEOUS) ×3 IMPLANT
DRAPE INCISE IOBAN 66X45 STRL (DRAPES) ×3 IMPLANT
DRAPE ORTHO SPLIT 77X108 STRL (DRAPES) ×4
DRAPE SURG 17X23 STRL (DRAPES) ×3 IMPLANT
DRAPE SURG ORHT 6 SPLT 77X108 (DRAPES) ×2 IMPLANT
DRAPE U-SHAPE 47X51 STRL (DRAPES) ×3 IMPLANT
DRSG AQUACEL AG ADV 3.5X 6 (GAUZE/BANDAGES/DRESSINGS) ×3 IMPLANT
DRSG AQUACEL AG ADV 3.5X10 (GAUZE/BANDAGES/DRESSINGS) IMPLANT
ELECT BLADE 4.0 EZ CLEAN MEGAD (MISCELLANEOUS)
ELECT REM PT RETURN 9FT ADLT (ELECTROSURGICAL) ×3
ELECTRODE BLDE 4.0 EZ CLN MEGD (MISCELLANEOUS) IMPLANT
ELECTRODE REM PT RTRN 9FT ADLT (ELECTROSURGICAL) ×1 IMPLANT
GLENOID PEG SHOULDER 40MM SML (Shoulder) ×1 IMPLANT
GLOVE BIO SURGEON STRL SZ7 (GLOVE) ×6 IMPLANT
GLOVE BIO SURGEON STRL SZ7.5 (GLOVE) ×3 IMPLANT
GLOVE BIOGEL PI IND STRL 7.0 (GLOVE) ×1 IMPLANT
GLOVE BIOGEL PI IND STRL 8 (GLOVE) ×1 IMPLANT
GLOVE BIOGEL PI INDICATOR 7.0 (GLOVE) ×2
GLOVE BIOGEL PI INDICATOR 8 (GLOVE) ×2
GOWN STRL REUS W/ TWL LRG LVL3 (GOWN DISPOSABLE) ×1 IMPLANT
GOWN STRL REUS W/ TWL XL LVL3 (GOWN DISPOSABLE) ×1 IMPLANT
GOWN STRL REUS W/TWL LRG LVL3 (GOWN DISPOSABLE) ×2
GOWN STRL REUS W/TWL XL LVL3 (GOWN DISPOSABLE) ×2
GUIDEWIRE GLENOID 2.5X220 (WIRE) ×3 IMPLANT
HANDPIECE INTERPULSE COAX TIP (DISPOSABLE) ×2
HEAD HUM AEQUALIS 43X16 (Head) ×3 IMPLANT
HEMOSTAT SURGICEL 2X14 (HEMOSTASIS) ×3 IMPLANT
HOOD PEEL AWAY FLYTE STAYCOOL (MISCELLANEOUS) ×6 IMPLANT
KIT BASIN OR (CUSTOM PROCEDURE TRAY) ×3 IMPLANT
KIT TURNOVER KIT B (KITS) ×3 IMPLANT
MANIFOLD NEPTUNE II (INSTRUMENTS) ×3 IMPLANT
NEEDLE MAYO TROCAR (NEEDLE) ×3 IMPLANT
NS IRRIG 1000ML POUR BTL (IV SOLUTION) ×3 IMPLANT
PACK SHOULDER (CUSTOM PROCEDURE TRAY) ×3 IMPLANT
PAD ARMBOARD 7.5X6 YLW CONV (MISCELLANEOUS) ×6 IMPLANT
RESTRAINT HEAD UNIVERSAL NS (MISCELLANEOUS) ×3 IMPLANT
RETRIEVER SUT HEWSON (MISCELLANEOUS) ×3 IMPLANT
SET HNDPC FAN SPRY TIP SCT (DISPOSABLE) ×1 IMPLANT
SHOULDER GLENOID PEG 40MM SML (Shoulder) ×3 IMPLANT
SLING ARM FOAM STRAP LRG (SOFTGOODS) ×3 IMPLANT
SLING ARM FOAM STRAP MED (SOFTGOODS) IMPLANT
SMARTMIX MINI TOWER (MISCELLANEOUS) ×3
SPONGE LAP 18X18 X RAY DECT (DISPOSABLE) ×3 IMPLANT
SPONGE LAP 4X18 RFD (DISPOSABLE) IMPLANT
STEM HUMERAL PTC SZ1C 66 1275D (Stem) ×3 IMPLANT
STRIP CLOSURE SKIN 1/2X4 (GAUZE/BANDAGES/DRESSINGS) ×2 IMPLANT
SUCTION FRAZIER HANDLE 10FR (MISCELLANEOUS) ×2
SUCTION TUBE FRAZIER 10FR DISP (MISCELLANEOUS) ×1 IMPLANT
SUPPORT WRAP ARM LG (MISCELLANEOUS) ×3 IMPLANT
SUT ETHIBOND NAB CT1 #1 30IN (SUTURE) ×9 IMPLANT
SUT FIBERWIRE #2 38 T-5 BLUE (SUTURE)
SUT MNCRL AB 4-0 PS2 18 (SUTURE) ×3 IMPLANT
SUT VIC AB 0 CT1 27 (SUTURE) ×2
SUT VIC AB 0 CT1 27XBRD ANBCTR (SUTURE) ×1 IMPLANT
SUT VIC AB 2-0 CT1 27 (SUTURE) ×2
SUT VIC AB 2-0 CT1 TAPERPNT 27 (SUTURE) ×1 IMPLANT
SUTURE FIBERWR #2 38 T-5 BLUE (SUTURE) IMPLANT
TAPE LABRALWHITE 1.5X36 (TAPE) ×3 IMPLANT
TAPE SUT LABRALTAP WHT/BLK (SUTURE) ×3 IMPLANT
TOWEL OR 17X26 10 PK STRL BLUE (TOWEL DISPOSABLE) ×3 IMPLANT
TOWER SMARTMIX MINI (MISCELLANEOUS) ×1 IMPLANT
TUBE CONNECTING 12'X1/4 (SUCTIONS) ×1
TUBE CONNECTING 12X1/4 (SUCTIONS) ×2 IMPLANT

## 2018-03-10 NOTE — Anesthesia Postprocedure Evaluation (Signed)
Anesthesia Post Note  Patient: Tanya Hogan  Procedure(s) Performed: RIGHT TOTAL SHOULDER ARTHROPLASTY (Right Shoulder)     Patient location during evaluation: PACU Anesthesia Type: General Level of consciousness: awake and alert Pain management: pain level controlled Vital Signs Assessment: post-procedure vital signs reviewed and stable Respiratory status: spontaneous breathing, nonlabored ventilation, respiratory function stable and patient connected to nasal cannula oxygen Cardiovascular status: blood pressure returned to baseline and stable Postop Assessment: no apparent nausea or vomiting Anesthetic complications: no    Last Vitals:  Vitals:   03/10/18 0935 03/10/18 1049  BP: (!) 101/43 (!) 112/51  Pulse: 70 70  Resp: (!) 21 16  Temp:  (!) 36.3 C  SpO2: 97% 92%    Last Pain:  Vitals:   03/10/18 1049  TempSrc: Oral  PainSc:        no pain          Effie Berkshire

## 2018-03-10 NOTE — Discharge Instructions (Signed)
Discharge Instructions after Total Shoulder Arthroplasty   A sling has been provided for you. Remove the sling 5 times each day to perform motion exercises. Use ice on the shoulder intermittently over the first 48 hours after surgery.  Pain medication has been prescribed for you.  Use your medication liberally over the first 48 hours, and then begin to taper your use. You may take Extra Strength Tylenol or Tylenol only in place of the pain pills. DO NOT take ANY nonsteroidal anti-inflammatory pain medications: Advil, Motrin, Ibuprofen, Aleve, Naproxen, or Naprosyn. Take one aspirin a day for 2 weeks after surgery, unless you have an aspirin sensitivity/allergy or asthma. Leave your dressing on until your first follow up visit.  You may shower with the dressing.  Hold your arm as if you still have your sling on while you shower. Active reaching and lifting are not permitted. You may use the operative arm for activities of daily living that do not require the operative arm to leave the side of the body, such as eating, drinking, bathing, etc.  Three to 5 times each day you should perform assisted overhead reaching and external rotation (outward turning) exercises with the operative arm. You were taught these exercises prior to discharge. Both exercises should be done with the non-operative arm used as the "therapist arm" while the operative arm remains relaxed. Ten of each exercise should be done three to five times each day.   Overhead reach is helping to lift your stiff arm up as high as it will go. To stretch your overhead reach, lie flat on your back, relax, and grasp the wrist of the tight shoulder with your opposite hand. Using the power in your opposite arm, bring the stiff arm up as far as it is comfortable. Start holding it for ten seconds and then work up to where you can hold it for a count of 30. Breathe slowly and deeply while the arm is moved. Repeat this stretch ten times, trying to help the  ar up a little higher each time.     External rotation is turning the arm out to the side while your elbow stays close to your body. External rotation is best stretched while you are lying on your back. Hold a cane, yardstick, broom handle, or dowel in both hands. Bend both elbows to a right angle. Use steady, gentle force from your normal arm to rotate the hand of the stiff shoulder out away from your body. Continue the rotation as far as it will go comfortably, holding it there for a count of 10. Repeat this exercise ten times.      Please call [phone] during normal business hours or [phone] after hours for any problems. Including the following:  - excessive redness of the incisions - drainage for more than 4 days - fever of more than 101.5 F  *Please note that pain medications will not be refilled after hours or on weekends.

## 2018-03-10 NOTE — Transfer of Care (Signed)
Immediate Anesthesia Transfer of Care Note  Patient: Tanya Hogan  Procedure(s) Performed: RIGHT TOTAL SHOULDER ARTHROPLASTY (Right Shoulder)  Patient Location: PACU  Anesthesia Type:General and Regional  Level of Consciousness: awake, alert , oriented, patient cooperative and responds to stimulation  Airway & Oxygen Therapy: Patient Spontanous Breathing and Patient connected to nasal cannula oxygen; RT at [facility] for BiPAP  Post-op Assessment: Report given to RN and Post -op Vital signs reviewed and stable  Post vital signs: Reviewed and stable  Last Vitals:  Vitals Value Taken Time  BP 123/83 03/10/2018  9:20 AM  Temp    Pulse 80 03/10/2018  9:20 AM  Resp 29 03/10/2018  9:20 AM  SpO2 95 % 03/10/2018  9:20 AM  Vitals shown include unvalidated device data.  Last Pain:  Vitals:   03/10/18 0636  TempSrc:   PainSc: 0-No pain      Patients Stated Pain Goal: 0 (83/07/46 0029)  Complications: No apparent anesthesia complications

## 2018-03-10 NOTE — Op Note (Signed)
Procedure(s): RIGHT TOTAL SHOULDER ARTHROPLASTY Procedure Note  Tanya Hogan female 67 y.o. 03/10/2018  Procedure(s) and Anesthesia Type:    * RIGHT TOTAL SHOULDER ARTHROPLASTY - General  Surgeon(s) and Role:    [name], MD - Primary   Indications:  67 y.o. female  With endstage right shoulder arthritis. Pain and dysfunction interfered with quality of life and nonoperative treatment with activity modification, NSAIDS and injections failed.     Surgeon: Isabella Stalling   Assistants: Jeanmarie Hubert PA-C [facility] was present and scrubbed throughout the procedure and was essential in positioning, retraction, exposure, and closure)  Anesthesia: General endotracheal anesthesia with preoperative interscalene block given by attending anesthesiologist  43 high offset  Procedure Detail  RIGHT TOTAL SHOULDER ARTHROPLASTY  Findings: Tornier flex anatomic press-fit size 1 stem with a 43 high offset head, cemented size 40s Cortiloc glenoid.   A lesser tuberosity osteotomy was performed and repaired at the conclusion of the procedure.  Estimated Blood Loss:  200 mL         Drains: None   Blood Given: none          Specimens: none        Complications:  * No complications entered in OR log *         Disposition: PACU - hemodynamically stable.         Condition: stable    Procedure:   The patient was identified in the preoperative holding area where I personally marked the operative extremity after verifying with the patient and consent. She  was taken to the operating room where She was transferred to the   operative table.  The patient received an interscalene block in   the holding area by the attending anesthesiologist.  General anesthesia was induced   in the operating room without complication.  The patient did receive IV  Ancef prior to the commencement of the procedure.  The patient was   placed in the beach-chair position with the back raised about 30    degrees.  The nonoperative extremity and head and neck were carefully   positioned and padded protecting against neurovascular compromise.  The   left upper extremity was then prepped and draped in the standard sterile   fashion.    The appropriate operative time-out was performed with   Anesthesia, the perioperative staff, as well as myself and we all agreed   that the right side was the correct operative site. The patient received 1 g IV tranexamic acid at the start of the case around time of the incision.  An approximately   10 cm incision was made from the tip of the coracoid to the center point of the   humerus at the level of the axilla.  Dissection was carried down sharply   through subcutaneous tissues and cephalic vein was identified and taken   laterally with the deltoid.  The pectoralis major was taken medially.  The   upper 1 cm of the pectoralis major was released from its attachment on   the humerus.  The clavipectoral fascia was incised just lateral to the   conjoined tendon.  This incision was carried up to but not into the   coracoacromial ligament.  Digital palpation was used to prove   integrity of the axillary nerve which was protected throughout the   procedure.  Musculocutaneous nerve was not palpated in the operative   field.  Conjoined tendon was then retracted gently medially and the  deltoid laterally.  Anterior circumflex humeral vessels were clamped and   coagulated.  The soft tissues overlying the biceps was incised and this   incision was carried across the transverse humeral ligament to the base   of the coracoid.  The biceps was noted to be severely degenerated. It was released from the superior labrum.  An osteotomy was performed at the lesser tuberosity.  The capsule was then   released all the way down to the 6 o'clock position of the humeral head.   The humeral head was then delivered with simultaneous adduction,   extension and external rotation.  All  humeral osteophytes were removed   and the anatomic neck of the humerus was marked and cut free hand at   approximately 25 degrees retroversion within about 3 mm of the cuff   reflection posteriorly.  The head size was estimated to be a 43 medium   offset.  At that point, the humeral head was retracted posteriorly with   a Fukuda retractor.   Remaining portion of the capsule was released at the base of the   coracoid.  The remaining biceps anchor and the entire anterior-inferior   labrum was excised.  The posterior labrum was also excised but the   posterior capsule was not released.  The guidepin was placed bicortically with non elevated guide.  The reamer was used to ream to concentric bone with punctate bleeding.  This gave an excellent concentric surface.  The center hole was then drilled for an anchor peg glenoid followed by the three peripheral holes and none of the holes   exited the glenoid wall.  I then pulse irrigated these holes and dried   them with Surgicel.  The three peripheral holes were then   pressurized cemented and the anchor peg glenoid was placed and impacted   with an excellent fit.  The glenoid was a 40s component.  The proximal humerus was then again exposed taking care not to displace the glenoid.    The entry awl was used followed by sounding reamers and then sequentially broached from size 1. This was then left in place and the calcar planer was used. Trial head was placed with a 43.  With the trial implantation of the component,  there was approximately 50% posterior translation with immediate snap back to the   anatomic position.  With forward elevation, there was no tendency   towards posterior subluxation.   The trial was removed and the final implant was prepared on a back table.  The trial was removed and the final implant was prepared on a back table.   3 small holes were drilled on the medial side of the lesser tuberosity osteotomy, through which 2 labral tapes  were passed. The implant was then placed through the loop of the 2 labral tapes and impacted with an excellent press-fit. This achieved excellent anatomic reconstruction of the proximal humerus.  The joint was then copiously irrigated with pulse lavage.  The subscapularis and   lesser tuberosity osteotomy were then repaired using the 2 labral tapes previously passed in a double row fashion with horizontal mattress sutures medially brought over through bone tunnels tied over a bone bridge laterally.   One #1 Ethibond was placed at the rotator interval just above   the lesser tuberosity. Copious irrigation was used. Skin was closed with 2-0 Vicryl sutures in the deep dermal layer and 4-0 Monocryl in a subcuticular  running fashion.  Sterile dressings were then  applied including Aquacel.  The patient was placed in a sling and allowed to awaken from general anesthesia and taken to the recovery room in stable condition.      POSTOPERATIVE PLAN:  Early passive range of motion will be allowed with the goal of 0 degrees external rotation and 90 degrees forward elevation.  No internal rotation at this time.  No active motion of the arm until the lesser tuberosity heals.  The patient will likely be kept in the hospital for 1-2 days and then discharged home.

## 2018-03-10 NOTE — Progress Notes (Signed)
Place pt on CPAP, bilevel mode of 16/6 and 2L bled in. H2O in chamber, Pt tolerating well. Will cont to ,monitor.

## 2018-03-10 NOTE — H&P (Signed)
Tanya Hogan is an 67 y.o. female.   Chief Complaint: R shoulder pain and dysfunction HPI: Endstage R shoulder arthritis with significant pain and dysfunction, failed conservative measures.  Pain interferes with sleep and quality of life.   Past Medical History:  Diagnosis Date  . Alkaline phosphatase elevation   . Asthma   . Breast cancer ([place]) 2011  . Carpal tunnel syndrome   . Carpal tunnel syndrome    bilateral  . CHF (congestive heart failure) ([place])   . Cirrhosis, non-alcoholic ([place])   . Closed fracture of unspecified part of radius (alone)   . DDD (degenerative disc disease), lumbar   . Depression    pt denies  . Diabetes mellitus without complication ([place])   . Enlarged heart   . Fibromyalgia   . GERD (gastroesophageal reflux disease)   . History of kidney stones   . HTN (hypertension)   . Hypothyroidism   . IBS (irritable bowel syndrome)   . LBBB (left bundle branch block)   . Microcytic anemia 05/14/2017  . Pneumonia   . Presence of permanent cardiac pacemaker   . Sleep apnea    uses cpap    Past Surgical History:  Procedure Laterality Date  . BREAST LUMPECTOMY Left 2011  . COLONOSCOPY    . FOOT SURGERY    . KNEE SURGERY    . PACEMAKER GENERATOR CHANGE N/A 08/23/2014   Procedure: PACEMAKER GENERATOR CHANGE;  Surgeon: Evans Lance, MD;  Location: [facility] CATH LAB;  Service: Cardiovascular;  Laterality: N/A;  . PACEMAKER PLACEMENT  2009   [facility]  . RECTAL SURGERY  1976  . TUBAL LIGATION      Family History  Problem Relation Age of Onset  . Melanoma Mother   . Heart failure Unknown   . Breast cancer Unknown   . Hypertension Unknown    Social History:  reports that she quit smoking about 51 years ago. She has never used smokeless tobacco. She reports that she does not drink alcohol or use drugs.  Allergies:  Allergies  Allergen Reactions  . Penicillins Anaphylaxis    Has patient had a PCN reaction causing immediate rash,  facial/tongue/throat swelling, SOB or lightheadedness with hypotension: Yes Has patient had a PCN reaction causing severe rash involving mucus membranes or skin necrosis: No Has patient had a PCN reaction that required hospitalization: No Has patient had a PCN reaction occurring within the last 10 years: No If all of the above answers are "NO", then may proceed with Cephalosporin use.   . Naproxen Swelling  . Accupril [Quinapril Hcl] Other (See Comments)    Hyperkalemia  . Aspirin Other (See Comments)    Pt states "burns holes in stomach", ulcers   . Atorvastatin Other (See Comments)    msucle aches  . Baking Soda-Fluoride [Sodium Fluoride] Itching  . Celebrex [Celecoxib]     ulcers  . Codeine Nausea And Vomiting  . Fluticasone Other (See Comments)    Nasal ulcer.    . Furosemide Other (See Comments)    Nose bleed  . Lactose Intolerance (Gi) Other (See Comments)    Increased calcium, sneezing, nasal issues  . Levocetirizine     Doesn't recall  . Losartan Potassium Itching  . Metformin And Related Diarrhea  . Nasonex [Mometasone Furoate] Other (See Comments)    nosebleed  . Rifampin Other (See Comments)    DOES NOT REMEMBER THIS MEDICATION OR ALLERGY  . Clindamycin/Lincomycin Rash  . Iodinated Diagnostic Agents Itching and  Rash    Pt states she has had ultrasound with dye but was fine  . Lanolin-Petrolatum Rash  . Miralax [Polyethylene Glycol] Rash  . Quinine Rash  . Sulfa Antibiotics Rash  . Sulfonamide Derivatives Rash  . Tramadol Rash    Anxiety  . Wool Alcohol [Lanolin] Rash  . Zoledronic Acid Other (See Comments) and Rash    shaking    Medications Prior to Admission  Medication Sig Dispense Refill  . ACCU-CHEK FASTCLIX LANCETS MISC Test twice a day 204 each 11  . ACCU-CHEK SMARTVIEW test strip TEST TWO TIMES DAILY 200 each 3  . acetaminophen (TYLENOL) 500 MG tablet Take 500 mg by mouth 2 (two) times daily as needed for moderate pain.    [name] albuterol (PROVENTIL  HFA;VENTOLIN HFA) 108 (90 Base) MCG/ACT inhaler Inhale 2 puffs into the lungs every 4 (four) hours as needed for wheezing or shortness of breath.    [name] alendronate (FOSAMAX) 70 MG tablet Take 1 tablet (70 mg total) by mouth every 7 (seven) days. Take with a full glass of water on an empty stomach. 12 tablet 4  . AMBULATORY NON FORMULARY MEDICATION Medication Name: Needs tubing and suplies for her nebulizer machine. Dx COPD. [facility] 1 vial 0  . AMBULATORY NON FORMULARY MEDICATION Fastclix lancets Dx: E11.9 300 each 3  . AMBULATORY NON FORMULARY MEDICATION Medication Name:[employer] Footwear. Dx: E11.8 DM type 2; M79.671,M79.672 Bilateral foot pain. 1 each 0  . AMBULATORY NON FORMULARY MEDICATION Rolling 4 post [place] with a seat 1 each 0  . BD PEN NEEDLE NANO U/F 32G X 4 MM MISC Test two times daily 180 each 11  . Blood Glucose Monitoring Suppl (ACCU-CHEK NANO SMARTVIEW) w/Device KIT To be used to test blood sugars two times daily. Dx:E11.8 1 kit 0  . bumetanide (BUMEX) 1 MG tablet TAKE 1 TABLET(1 MG) BY MOUTH TWICE DAILY 180 tablet 1  . BYSTOLIC 10 MG tablet TAKE 1 TABLET BY MOUTH  DAILY 90 tablet 1  . Calcium Carb-Cholecalciferol (CALCIUM 500/D) 500-400 MG-UNIT CHEW Chew 600 mg by mouth 2 (two) times daily.    . clotrimazole-betamethasone (LOTRISONE) cream Apply 1 application topically 2 (two) times daily as needed.     . dicyclomine (BENTYL) 20 MG tablet Take 10-20 mg by mouth every 8 (eight) hours as needed for spasms (cramps).   11  . diltiazem (CARDIZEM) 120 MG tablet Take 1 tablet (120 mg total) by mouth daily. 90 tablet 3  . diphenhydrAMINE (BENADRYL) 25 MG tablet Take 25 mg by mouth daily as needed for allergies.    . Fluticasone-Salmeterol (ADVAIR) 250-50 MCG/DOSE AEPB Inhale 1 puff into the lungs 2 (two) times daily.    [name] gabapentin (NEURONTIN) 100 MG capsule TAKE 1 TO 2 CAPSULES(100 TO 200 MG) BY MOUTH AT BEDTIME (Patient taking differently: Take 100 - 200 mg at bedtime as needed  for pain) 60 capsule 3  . insulin glargine (LANTUS) 100 unit/mL SOPN Inject 0.4 mLs (40 Units total) into the skin at bedtime. Place on hold 10 pen 1  . levothyroxine (SYNTHROID, LEVOTHROID) 75 MCG tablet TAKE 1 TABLET BY MOUTH  DAILY 90 tablet 1  . Magnesium 500 MG TABS Take 500 mg by mouth daily as needed (spasms).    . Multiple Vitamin (MULTIVITAMIN WITH MINERALS) TABS tablet Take 1 tablet by mouth daily.    . Multiple Vitamins-Minerals (OCUVITE EYE + MULTI) TABS Take 1 tablet by mouth daily.    [name] nystatin cream (MYCOSTATIN) Apply  1 application topically 2 (two) times daily. (Patient taking differently: Apply 1 application topically 2 (two) times daily as needed (rash). ) 60 g 1  . pantoprazole (PROTONIX) 40 MG tablet TAKE 1 TABLET BY MOUTH  EVERY MORNING AS DIRECTED    . pravastatin (PRAVACHOL) 40 MG tablet Take 1 tablet (40 mg total) by mouth at bedtime. 30 tablet 1  . senna-docusate (SENOKOT-S) 8.6-50 MG tablet Take 1 tablet by mouth daily as needed for mild constipation.    [name] tiotropium (SPIRIVA) 18 MCG inhalation capsule Place 18 mcg into inhaler and inhale daily. Inhale the contents of 1 capsule via handhaler    . EPIPEN 2-PAK 0.3 MG/0.3ML SOAJ injection INJECT 1 PEN INTO MUSCLE OF LATERAL THIGH FOR SEVERE ALLERGIC REACTION. CALL 911 AFTER USING THIS MEDICATION.  2  . levalbuterol (XOPENEX) 1.25 MG/3ML nebulizer solution Take 1.25 mg by nebulization every 8 (eight) hours as needed for wheezing or shortness of breath.   2    Results for orders placed or performed during the hospital encounter of 03/10/18 (from the past 48 hour(s))  Glucose, capillary     Status: Abnormal   Collection Time: 03/10/18  6:08 AM  Result Value Ref Range   Glucose-Capillary 143 (H) 70 - 99 mg/dL   No results found.  Review of Systems  All other systems reviewed and are negative.   Blood pressure (!) 131/58, pulse 74, temperature 97.6 F (36.4 C), temperature source Oral, resp. rate 20, height 5' 1.5"  (1.562 m), weight 103 kg (227 lb), SpO2 95 %. Physical Exam  Constitutional: She is oriented to person, place, and time. She appears well-developed and well-nourished.  HENT:  Head: Atraumatic.  Eyes: EOM are normal.  Cardiovascular: Intact distal pulses.  Respiratory: Effort normal.  Musculoskeletal:  R shoulder pain with limited ROM. NVID.  Neurological: She is alert and oriented to person, place, and time.  Skin: Skin is warm and dry.  Psychiatric: She has a normal mood and affect.     Assessment/Plan Endstage R shoulder arthritis with significant pain and dysfunction, failed conservative measures.  Pain interferes with sleep and quality of life. Plan R TSA Risks / benefits of surgery discussed Consent on chart  NPO for OR Preop antibiotics   Isabella Stalling, MD 03/10/2018, 7:06 AM

## 2018-03-10 NOTE — Anesthesia Procedure Notes (Signed)
Anesthesia Regional Block: Interscalene brachial plexus block   Pre-Anesthetic Checklist: ,, timeout performed, Correct Patient, Correct Site, Correct Laterality, Correct Procedure, Correct Position, site marked, Risks and benefits discussed,  Surgical consent,  Pre-op evaluation,  At surgeon's request and post-op pain management  Laterality: Right  Prep: chloraprep       Needles:  Injection technique: Single-shot  Needle Type: Echogenic Needle     Needle Length: 9cm  Needle Gauge: 21     Additional Needles:   Procedures:,,,, ultrasound used (permanent image in chart),,,,  Narrative:  Start time: 03/10/2018 7:15 AM End time: 03/10/2018 7:25 AM Injection made incrementally with aspirations every 5 mL.  Performed by: Personally  Anesthesiologist: Effie Berkshire, MD  Additional Notes: Patient tolerated the procedure well. Local anesthetic introduced in an incremental fashion under minimal resistance after negative aspirations. No paresthesias were elicited. After completion of the procedure, no acute issues were identified and patient continued to be monitored by RN.

## 2018-03-11 ENCOUNTER — Encounter (HOSPITAL_COMMUNITY): Payer: Self-pay | Admitting: Orthopedic Surgery

## 2018-03-11 LAB — CBC
HCT: 41.4 % (ref 36.0–46.0)
Hemoglobin: 12.9 g/dL (ref 12.0–15.0)
MCH: 29.8 pg (ref 26.0–34.0)
MCHC: 31.2 g/dL (ref 30.0–36.0)
MCV: 95.6 fL (ref 78.0–100.0)
PLATELETS: 301 10*3/uL (ref 150–400)
RBC: 4.33 MIL/uL (ref 3.87–5.11)
RDW: 13.4 % (ref 11.5–15.5)
WBC: 14.5 10*3/uL — AB (ref 4.0–10.5)

## 2018-03-11 LAB — BASIC METABOLIC PANEL
ANION GAP: 8 (ref 5–15)
BUN: 10 mg/dL (ref 8–23)
CALCIUM: 9.3 mg/dL (ref 8.9–10.3)
CO2: 23 mmol/L (ref 22–32)
Chloride: 108 mmol/L (ref 98–111)
Creatinine, Ser: 0.76 mg/dL (ref 0.44–1.00)
GLUCOSE: 154 mg/dL — AB (ref 70–99)
Potassium: 4.8 mmol/L (ref 3.5–5.1)
Sodium: 139 mmol/L (ref 135–145)

## 2018-03-11 LAB — GLUCOSE, CAPILLARY
GLUCOSE-CAPILLARY: 146 mg/dL — AB (ref 70–99)
GLUCOSE-CAPILLARY: 164 mg/dL — AB (ref 70–99)
Glucose-Capillary: 174 mg/dL — ABNORMAL HIGH (ref 70–99)

## 2018-03-11 NOTE — Progress Notes (Signed)
Occupational Therapy Treatment Patient Details Name: Tanya Hogan MRN: [id number] DOB: [birth year] Today's Date: 03/11/2018    History of present illness 67 yo female s/p R TSA   OT comments  Pt with son present and at adequate level for d/c home. Pt dressed during session and answered all questions regarding adls.    Follow Up Recommendations  Follow surgeon's recommendation for DC plan and follow-up therapies    Equipment Recommendations  3 in 1 bedside commode    Recommendations for Other Services      Precautions / Restrictions Precautions Precautions: Shoulder Type of Shoulder Precautions: FF 90 and ER 0 Shoulder Interventions: Off for dressing/bathing/exercises Required Braces or Orthoses: Sling Restrictions Weight Bearing Restrictions: Yes RUE Weight Bearing: Non weight bearing       Mobility Bed Mobility               General bed mobility comments: in chair  Transfers Overall transfer level: Needs assistance   Transfers: Sit to/from Stand Sit to Stand: Min guard         General transfer comment: pt pushing up with L UE    Balance                                           ADL either performed or assessed with clinical judgement   ADL Overall ADL's : Needs assistance/impaired                 Upper Body Dressing : Minimal assistance   Lower Body Dressing: Minimal assistance   Toilet Transfer: Min guard   Toileting- Clothing Manipulation and Hygiene: Min guard       Functional mobility during ADLs: Min guard General ADL Comments: pt completed dressing for home and educated on positioning and sling management. pt has tongs at home to use for peri and dressing     Vision       Perception     Praxis      Cognition Arousal/Alertness: Awake/alert Behavior During Therapy: WFL for tasks assessed/performed Overall Cognitive Status: Within Functional Limits for tasks assessed                                           Exercises     Shoulder Instructions Shoulder Instructions Donning/doffing shirt without moving shoulder: Minimal assistance Method for sponge bathing under operated UE: Minimal assistance Donning/doffing sling/immobilizer: Min-guard Correct positioning of sling/immobilizer: Min-guard ROM for elbow, wrist and digits of operated UE: Minimal assistance Sling wearing schedule (on at all times/off for ADL's): Modified independent     General Comments educated on bandage and need for new linen each bath and to avoid washing on incision    Pertinent Vitals/ Pain       Pain Assessment: Faces Faces Pain Scale: Hurts little more Pain Location: R shoulder Pain Descriptors / Indicators: Discomfort Pain Intervention(s): Premedicated before session;Repositioned;Monitored during session;Ice applied  Home Living                                          Prior Functioning/Environment              Frequency  Min 3X/week  Progress Toward Goals  OT Goals(current goals can now be found in the care plan section)  Progress towards OT goals: Progressing toward goals  Acute Rehab OT Goals Patient Stated Goal: none stated this session- pt needed name call at times during session to stay aroused OT Goal Formulation: With patient/family Time For Goal Achievement: 03/25/18 Potential to Achieve Goals: Good ADL Goals Pt/caregiver will Perform Home Exercise Program: With written HEP provided;With Supervision Additional ADL Goal #1: Pt will direct caregiver to don doff sling supervision level  Plan Discharge plan remains appropriate    Co-evaluation                 AM-PAC PT "6 Clicks" Daily Activity     Outcome Measure   Help from another person eating meals?: A Little Help from another person taking care of personal grooming?: A Little Help from another person toileting, which includes using toliet, bedpan, or urinal?: A  Little Help from another person bathing (including washing, rinsing, drying)?: A Little Help from another person to put on and taking off regular upper body clothing?: A Little Help from another person to put on and taking off regular lower body clothing?: A Little 6 Click Score: 18    End of Session    OT Visit Diagnosis: Unsteadiness on feet (R26.81)   Activity Tolerance Patient tolerated treatment well   Patient Left in chair;with call bell/phone within reach;with family/visitor present   Nurse Communication Mobility status;Precautions        Time: 1434(1434)-1452 OT Time Calculation (min): 18 min  Charges: OT General Charges $OT Visit: 1 Visit OT Treatments $Self Care/Home Management : 8-22 mins   Jeri Modena   OTR/L Pager: [phone] Office: [phone] .    Parke Poisson B 03/11/2018, 3:51 PM

## 2018-03-11 NOTE — Progress Notes (Addendum)
   PATIENT ID: Tanya Hogan   1 Day Post-Op Procedure(s) (LRB): RIGHT TOTAL SHOULDER ARTHROPLASTY (Right)  Subjective: R shoulder with some pain.   Objective:  Vitals:   03/11/18 0008 03/11/18 0445  BP: 128/72 137/68  Pulse: 73 68  Resp:  16  Temp: 98.6 F (37 C) 98 F (36.7 C)  SpO2: 93%      R UE dressing c/d/i Wiggles fingers, distally NVI  Labs:  Recent Labs    03/11/18 0551  HGB 12.9   Recent Labs    03/11/18 0551  WBC 14.5*  RBC 4.33  HCT 41.4  PLT 301   Recent Labs    03/11/18 0551  NA 139  K 4.8  CL 108  CO2 23  BUN 10  CREATININE 0.76  GLUCOSE 154*  CALCIUM 9.3    Assessment and Plan: 1 day s/p right TSA OT- PROM goal to 90 RR O ER D/c home when cleared by OT Fu with Dr. Tamera Punt in 2 weeks 24 hr post op abx refused by patient due to rash side effect  VTE proph: patient refused ASA, SCDs

## 2018-03-11 NOTE — Progress Notes (Signed)
[name] to be D/C'd Home per MD order.  Discussed prescriptions and follow up appointments with the patient. Prescriptions given to patient, medication list explained in detail. Pt verbalized understanding.  Allergies as of 03/11/2018      Reactions   Penicillins Anaphylaxis   Has patient had a PCN reaction causing immediate rash, facial/tongue/throat swelling, SOB or lightheadedness with hypotension: Yes Has patient had a PCN reaction causing severe rash involving mucus membranes or skin necrosis: No Has patient had a PCN reaction that required hospitalization: No Has patient had a PCN reaction occurring within the last 10 years: No If all of the above answers are "NO", then may proceed with Cephalosporin use.   Naproxen Swelling   Accupril [quinapril Hcl] Other (See Comments)   Hyperkalemia   Aspirin Other (See Comments)   Pt states "burns holes in stomach", ulcers    Atorvastatin Other (See Comments)   msucle aches   Baking Soda-fluoride [sodium Fluoride] Itching   Celebrex [celecoxib]    ulcers   Codeine Nausea And Vomiting   Fluticasone Other (See Comments)   Nasal ulcer.     Furosemide Other (See Comments)   Nose bleed   Lactose Intolerance (gi) Other (See Comments)   Increased calcium, sneezing, nasal issues   Levocetirizine    Doesn't recall   Losartan Potassium Itching   Metformin And Related Diarrhea   Nasonex [mometasone Furoate] Other (See Comments)   nosebleed   Rifampin Other (See Comments)   DOES NOT REMEMBER THIS MEDICATION OR ALLERGY   Clindamycin/lincomycin Rash   Iodinated Diagnostic Agents Itching, Rash   Pt states she has had ultrasound with dye but was fine   Lanolin-petrolatum Rash   Miralax [polyethylene Glycol] Rash   Quinine Rash   Sulfa Antibiotics Rash   Sulfonamide Derivatives Rash   Tramadol Rash   Anxiety   Wool Alcohol [lanolin] Rash   Zoledronic Acid Other (See Comments), Rash   shaking      Medication List    STOP taking  these medications   acetaminophen 500 MG tablet Commonly known as:  TYLENOL     TAKE these medications   ACCU-CHEK FASTCLIX LANCETS Misc Test twice a day   ACCU-CHEK NANO SMARTVIEW w/Device Kit To be used to test blood sugars two times daily. Dx:E11.8   ACCU-CHEK SMARTVIEW test strip Generic drug:  glucose blood TEST TWO TIMES DAILY   albuterol 108 (90 Base) MCG/ACT inhaler Commonly known as:  PROVENTIL HFA;VENTOLIN HFA Inhale 2 puffs into the lungs every 4 (four) hours as needed for wheezing or shortness of breath.   alendronate 70 MG tablet Commonly known as:  FOSAMAX Take 1 tablet (70 mg total) by mouth every 7 (seven) days. Take with a full glass of water on an empty stomach.   AMBULATORY NON FORMULARY MEDICATION Medication Name: Needs tubing and suplies for her nebulizer machine. Dx COPD. [facility]   AMBULATORY NON FORMULARY MEDICATION Fastclix lancets Dx: E11.9   AMBULATORY NON FORMULARY MEDICATION Medication Name:[employer] Footwear. Dx: E11.8 DM type 2; M79.671,M79.672 Bilateral foot pain.   AMBULATORY NON FORMULARY MEDICATION Rolling 4 post [place] with a seat   BD PEN NEEDLE NANO U/F 32G X 4 MM Misc Generic drug:  Insulin Pen Needle Test two times daily   bumetanide 1 MG tablet Commonly known as:  BUMEX TAKE 1 TABLET(1 MG) BY MOUTH TWICE DAILY   BYSTOLIC 10 MG tablet Generic drug:  nebivolol TAKE 1 TABLET BY MOUTH  DAILY  CALCIUM 500/D 500-400 MG-UNIT Chew Generic drug:  Calcium Carb-Cholecalciferol Chew 600 mg by mouth 2 (two) times daily.   clotrimazole-betamethasone cream Commonly known as:  LOTRISONE Apply 1 application topically 2 (two) times daily as needed.   dicyclomine 20 MG tablet Commonly known as:  BENTYL Take 10-20 mg by mouth every 8 (eight) hours as needed for spasms (cramps).   diltiazem 120 MG tablet Commonly known as:  CARDIZEM Take 1 tablet (120 mg total) by mouth daily.   diphenhydrAMINE 25 MG tablet Commonly known  as:  BENADRYL Take 25 mg by mouth daily as needed for allergies.   EPIPEN 2-PAK 0.3 mg/0.3 mL Soaj injection Generic drug:  EPINEPHrine INJECT 1 PEN INTO MUSCLE OF LATERAL THIGH FOR SEVERE ALLERGIC REACTION. CALL 911 AFTER USING THIS MEDICATION.   Fluticasone-Salmeterol 250-50 MCG/DOSE Aepb Commonly known as:  ADVAIR Inhale 1 puff into the lungs 2 (two) times daily.   gabapentin 100 MG capsule Commonly known as:  NEURONTIN TAKE 1 TO 2 CAPSULES(100 TO 200 MG) BY MOUTH AT BEDTIME What changed:  See the new instructions.   insulin glargine 100 unit/mL Sopn Commonly known as:  LANTUS Inject 0.4 mLs (40 Units total) into the skin at bedtime. Place on hold   levalbuterol 1.25 MG/3ML nebulizer solution Commonly known as:  XOPENEX Take 1.25 mg by nebulization every 8 (eight) hours as needed for wheezing or shortness of breath.   levothyroxine 75 MCG tablet Commonly known as:  SYNTHROID, LEVOTHROID TAKE 1 TABLET BY MOUTH  DAILY   Magnesium 500 MG Tabs Take 500 mg by mouth daily as needed (spasms).   methocarbamol 500 MG tablet Commonly known as:  ROBAXIN Take 1 tablet (500 mg total) by mouth 3 (three) times daily.   multivitamin with minerals Tabs tablet Take 1 tablet by mouth daily.   nystatin cream Commonly known as:  MYCOSTATIN Apply 1 application topically 2 (two) times daily. What changed:    when to take this  reasons to take this   OCUVITE EYE + MULTI Tabs Take 1 tablet by mouth daily.   ondansetron 4 MG tablet Commonly known as:  ZOFRAN Take 1 tablet (4 mg total) by mouth daily as needed for nausea or vomiting.   oxyCODONE-acetaminophen 5-325 MG tablet Commonly known as:  PERCOCET Take 1-2 tablets by mouth every 4 (four) hours as needed for severe pain.   pantoprazole 40 MG tablet Commonly known as:  PROTONIX TAKE 1 TABLET BY MOUTH  EVERY MORNING AS DIRECTED   pravastatin 40 MG tablet Commonly known as:  PRAVACHOL Take 1 tablet (40 mg total) by mouth at  bedtime.   senna-docusate 8.6-50 MG tablet Commonly known as:  Senokot-S Take 1 tablet by mouth daily as needed for mild constipation.   tiotropium 18 MCG inhalation capsule Commonly known as:  SPIRIVA Place 18 mcg into inhaler and inhale daily. Inhale the contents of 1 capsule via [place]  (From admission, onward)        Start     Ordered   03/11/18 1436  For home use only DME Bedside commode  Once    Question:  Patient needs a bedside commode to treat with the following condition  Answer:  Status post total shoulder arthroplasty, right   03/11/18 1435      Vitals:   03/11/18 0831 03/11/18 0832  BP:    Pulse:    Resp:    Temp:  SpO2: 93% 93%    Skin clean, dry and intact without evidence of skin break down, no evidence of skin tears noted. Aquacel dressing in place, clean, dry, and intact with sling. IV catheter discontinued intact. Site without signs and symptoms of complications. Dressing and pressure applied. Pt denies pain at this time. No complaints noted.  An After Visit Summary was printed and given to the patient. Patient escorted via [place], and D/C home via private auto.  [place] RN

## 2018-03-11 NOTE — Progress Notes (Signed)
Pt. Refused CPAP at 0140, O2 sat 92% nasal cannula on 2L, will continue to monitor.

## 2018-03-11 NOTE — Progress Notes (Signed)
   03/11/18 1159  Clinical Encounter Type  Visited With Patient;Health care provider  Visit Type Initial;Spiritual support  Referral From Nurse;Patient  Consult/Referral To Chaplain  Spiritual Encounters  Spiritual Needs Prayer   Responded to a [facility] for prayer.  Patient is post shoulder surgery and may go home today.  Patient shared about what caused her to have to have surgery and shared about her life and family.  She wanted to talk about theology.  She stated she lives with her brother, but does have other family in the area.  Seems to have a good support network and seems to be anxious to get back to life.  We prayed together.  Will follow and support as needed. Chaplain Katherene Ponto

## 2018-03-11 NOTE — Evaluation (Signed)
Occupational Therapy Evaluation Patient Details Name: Tanya Hogan MRN: [id number] DOB: 1951-08-15 Today's Date: 03/11/2018    History of Present Illness 67 yo female s/p R TSA PMH Breast CA, BIL carpal tunnerl, CHF, cirrhosis, DDD, depreseion, DM, fibromyalgia, GERD, PNA, LBBB, IBS, hypothyroidism, kidney stones     Clinical Impression   Patient is s/p R TSA surgery resulting in functional limitations due to the deficits listed below (see OT problem list). Pt currently requires max (A) for all adls and can not don doff sling. Pt needs cues for arousal during session. Ot to complete second session today to help assess recall of information provided. Pt and visitor agreeable to this. Visitor without questions and states "I dont know what to ask"  Patient will benefit from skilled OT acutely to increase independence and safety with ADLS to allow discharge follow up by MD.     Follow Up Recommendations  Follow surgeon's recommendation for DC plan and follow-up therapies    Equipment Recommendations  3 in 1 bedside commode    Recommendations for Other Services       Precautions / Restrictions Precautions Precautions: Shoulder Type of Shoulder Precautions: FF 90 and ER 0 Shoulder Interventions: Off for dressing/bathing/exercises Required Braces or Orthoses: Sling Restrictions Weight Bearing Restrictions: Yes RUE Weight Bearing: Non weight bearing      Mobility Bed Mobility               General bed mobility comments: in chair on arrival  Transfers                 General transfer comment: in chair on arrival and focused on shoulder education due to arousal and attention at [facility] stime    Balance                                           ADL either performed or assessed with clinical judgement   ADL Overall ADL's : Needs assistance/impaired Eating/Feeding: Set up;Sitting Eating/Feeding Details (indicate cue type and reason): pt is R hand  dominant Grooming: Set up;Sitting   Upper Body Bathing: Maximal assistance   Lower Body Bathing: Maximal assistance   Upper Body Dressing : Maximal assistance   Lower Body Dressing: Maximal assistance     Toilet Transfer Details (indicate cue type and reason): in chair on arrival           General ADL Comments: due to patient R ue remaining numb with nerve block in affect OT provided handout with education. Ot to return to help reinforce this education. OT to assess recall of information provided to patient and visitors.     Vision         Perception     Praxis      Pertinent Vitals/Pain Pain Assessment: 0-10 Pain Score: 3  Pain Location: R shoulder Pain Descriptors / Indicators: Discomfort Pain Intervention(s): Premedicated before session;Monitored during session;Repositioned;Ice applied     Hand Dominance     Extremity/Trunk Assessment Upper Extremity Assessment Upper Extremity Assessment: RUE deficits/detail RUE Deficits / Details: s/p surg with PROM protocol and FF 90 and ER o    Lower Extremity Assessment Lower Extremity Assessment: Overall WFL for tasks assessed   Cervical / Trunk Assessment Cervical / Trunk Assessment: Kyphotic   Communication     Cognition Arousal/Alertness: Lethargic Behavior During Therapy: Flat affect  General Comments: pt answering quesitons but falling asleep. pt with female visitor in the room. the female visitor reports "i will do whatever i need to do" this female visitor does not give detail responses to questions and rather looks to patient to respond   General Comments       Exercises Exercises: Shoulder Shoulder Exercises Shoulder Flexion: PROM;Right;10 reps;Seated(reclined and educating visitor witih patient) Shoulder External Rotation: (educated neutral only)   Shoulder Instructions Shoulder Instructions Donning/doffing sling/immobilizer: Maximal assistance Correct  positioning of sling/immobilizer: Moderate assistance Pendulum exercises (written home exercise program): (na) ROM for elbow, wrist and digits of operated UE: Maximal assistance(demonstrated FF 90 degrees with therapist) Sling wearing schedule (on at all times/off for ADL's): Moderate assistance    Home Living Family/patient expects to be discharged to:: Private residence Living Arrangements: Other (Comment)(brother)                                      Prior Functioning/Environment                   OT Problem List: Decreased strength;Decreased activity tolerance;Decreased range of motion;Impaired balance (sitting and/or standing);Decreased safety awareness;Decreased knowledge of use of DME or AE;Decreased knowledge of precautions;Impaired UE functional use      OT Treatment/Interventions: Self-care/ADL training;Therapeutic exercise;Neuromuscular education;DME and/or AE instruction;Therapeutic activities;Patient/family education;Balance training    OT Goals(Current goals can be found in the care plan section) Acute Rehab OT Goals Patient Stated Goal: none stated this session- pt needed name call at times during session to stay aroused OT Goal Formulation: With patient/family Time For Goal Achievement: 03/25/18 Potential to Achieve Goals: Good  OT Frequency: Min 3X/week   Barriers to D/C:            Co-evaluation              AM-PAC PT "6 Clicks" Daily Activity     Outcome Measure Help from another person eating meals?: A Lot Help from another person taking care of personal grooming?: A Lot Help from another person toileting, which includes using toliet, bedpan, or urinal?: A Lot Help from another person bathing (including washing, rinsing, drying)?: A Lot Help from another person to put on and taking off regular upper body clothing?: A Lot Help from another person to put on and taking off regular lower body clothing?: A Lot 6 Click Score: 12    End of Session Equipment Utilized During Treatment: Other (comment)(sling) Nurse Communication: Mobility status;Precautions;Weight bearing status  Activity Tolerance: Patient tolerated treatment well Patient left: in chair;with call bell/phone within reach;with family/visitor present;with nursing/sitter in room  OT Visit Diagnosis: Unsteadiness on feet (R26.81)                Time: 3343-5686 OT Time Calculation (min): 14 min Charges:  OT General Charges $OT Visit: 1 Visit OT Evaluation $OT Eval Moderate Complexity: 1 Mod G-Codes:      Jeri Modena   OTR/L Pager: [phone] Office: [phone] .   Parke Poisson B 03/11/2018, 11:54 AM

## 2018-03-11 NOTE — Discharge Summary (Signed)
Patient ID: Tanya Hogan MRN: [id number] DOB/AGE: 09-22-1950 67 y.o.  Admit date: 03/10/2018 Discharge date: 03/11/2018  Admission Diagnoses:  Active Problems:   Status post total shoulder arthroplasty, right   Discharge Diagnoses:  Same  Past Medical History:  Diagnosis Date  . Alkaline phosphatase elevation   . Asthma   . Breast cancer ([place]) 2011  . Carpal tunnel syndrome   . Carpal tunnel syndrome    bilateral  . CHF (congestive heart failure) ([place])   . Cirrhosis, non-alcoholic ([place])   . Closed fracture of unspecified part of radius (alone)   . DDD (degenerative disc disease), lumbar   . Depression    pt denies  . Diabetes mellitus without complication ([place])   . Enlarged heart   . Fibromyalgia   . GERD (gastroesophageal reflux disease)   . History of kidney stones   . HTN (hypertension)   . Hypothyroidism   . IBS (irritable bowel syndrome)   . LBBB (left bundle branch block)   . Microcytic anemia 05/14/2017  . Pneumonia   . Presence of permanent cardiac pacemaker   . Sleep apnea    uses cpap    Surgeries: Procedure(s): RIGHT TOTAL SHOULDER ARTHROPLASTY on 03/10/2018   Consultants:   Discharged Condition: Improved  Hospital Course: Tanya Hogan is an 67 y.o. female who was admitted 03/10/2018 for operative treatment of right shoulder OA. Patient has severe unremitting pain that affects sleep, daily activities, and work/hobbies. After pre-op clearance the patient was taken to the operating room on 03/10/2018 and underwent  Procedure(s): RIGHT TOTAL SHOULDER ARTHROPLASTY.    Patient was given perioperative antibiotics:  Anti-infectives (From admission, onward)   Start     Dose/Rate Route Frequency Ordered Stop   03/10/18 1500  clindamycin (CLEOCIN) IVPB 600 mg  Status:  Discontinued     600 mg 100 mL/hr over 30 Minutes Intravenous Every 6 hours 03/10/18 1316 03/10/18 1454   03/10/18 0700  vancomycin (VANCOCIN) 1,500 mg in sodium chloride 0.9 % 500 mL IVPB      1,500 mg 250 mL/hr over 120 Minutes Intravenous To Surgery 03/09/18 1329 03/10/18 0655   03/10/18 0600  clindamycin (CLEOCIN) IVPB 900 mg  Status:  Discontinued     900 mg 100 mL/hr over 30 Minutes Intravenous  Once 03/10/18 0550 03/10/18 2952       Patient was given sequential compression devices, early ambulation, and chemoprophylaxis to prevent DVT.  Patient benefited maximally from hospital stay and there were no complications.    Recent vital signs:  Patient Vitals for the past 24 hrs:  BP Temp Temp src Pulse Resp SpO2  03/11/18 0445 137/68 98 F (36.7 C) Oral 68 16 -  03/11/18 0008 128/72 98.6 F (37 C) Oral 73 - 93 %  03/10/18 2101 - - - 78 20 91 %  03/10/18 1933 138/68 97.7 F (36.5 C) Oral 82 16 91 %  03/10/18 1049 (!) 112/51 (!) 97.3 F (36.3 C) Oral 70 16 92 %  03/10/18 0935 (!) 101/43 - - 70 (!) 21 97 %  03/10/18 0931 - - - 70 (!) 23 98 %  03/10/18 0920 123/83 97.7 F (36.5 C) - 81 (!) 26 93 %     Recent laboratory studies:  Recent Labs    03/11/18 0551  WBC 14.5*  HGB 12.9  HCT 41.4  PLT 301  NA 139  K 4.8  CL 108  CO2 23  BUN 10  CREATININE 0.76  GLUCOSE 154*  CALCIUM 9.3     Discharge Medications:   Allergies as of 03/11/2018      Reactions   Penicillins Anaphylaxis   Has patient had a PCN reaction causing immediate rash, facial/tongue/throat swelling, SOB or lightheadedness with hypotension: Yes Has patient had a PCN reaction causing severe rash involving mucus membranes or skin necrosis: No Has patient had a PCN reaction that required hospitalization: No Has patient had a PCN reaction occurring within the last 10 years: No If all of the above answers are "NO", then may proceed with Cephalosporin use.   Naproxen Swelling   Accupril [quinapril Hcl] Other (See Comments)   Hyperkalemia   Aspirin Other (See Comments)   Pt states "burns holes in stomach", ulcers    Atorvastatin Other (See Comments)   msucle aches   Baking Soda-fluoride  [sodium Fluoride] Itching   Celebrex [celecoxib]    ulcers   Codeine Nausea And Vomiting   Fluticasone Other (See Comments)   Nasal ulcer.     Furosemide Other (See Comments)   Nose bleed   Lactose Intolerance (gi) Other (See Comments)   Increased calcium, sneezing, nasal issues   Levocetirizine    Doesn't recall   Losartan Potassium Itching   Metformin And Related Diarrhea   Nasonex [mometasone Furoate] Other (See Comments)   nosebleed   Rifampin Other (See Comments)   DOES NOT REMEMBER THIS MEDICATION OR ALLERGY   Clindamycin/lincomycin Rash   Iodinated Diagnostic Agents Itching, Rash   Pt states she has had ultrasound with dye but was fine   Lanolin-petrolatum Rash   Miralax [polyethylene Glycol] Rash   Quinine Rash   Sulfa Antibiotics Rash   Sulfonamide Derivatives Rash   Tramadol Rash   Anxiety   Wool Alcohol [lanolin] Rash   Zoledronic Acid Other (See Comments), Rash   shaking      Medication List    STOP taking these medications   acetaminophen 500 MG tablet Commonly known as:  TYLENOL     TAKE these medications   ACCU-CHEK FASTCLIX LANCETS Misc Test twice a day   ACCU-CHEK NANO SMARTVIEW w/Device Kit To be used to test blood sugars two times daily. Dx:E11.8   ACCU-CHEK SMARTVIEW test strip Generic drug:  glucose blood TEST TWO TIMES DAILY   albuterol 108 (90 Base) MCG/ACT inhaler Commonly known as:  PROVENTIL HFA;VENTOLIN HFA Inhale 2 puffs into the lungs every 4 (four) hours as needed for wheezing or shortness of breath.   alendronate 70 MG tablet Commonly known as:  FOSAMAX Take 1 tablet (70 mg total) by mouth every 7 (seven) days. Take with a full glass of water on an empty stomach.   AMBULATORY NON FORMULARY MEDICATION Medication Name: Needs tubing and suplies for her nebulizer machine. Dx COPD. [facility]   AMBULATORY NON FORMULARY MEDICATION Fastclix lancets Dx: E11.9   AMBULATORY NON FORMULARY MEDICATION Medication  Name:[employer]ibetic Footwear. Dx: E11.8 DM type 2; M79.671,M79.672 Bilateral foot pain.   AMBULATORY NON FORMULARY MEDICATION Rolling 4 post [place] with a seat   BD PEN NEEDLE NANO U/F 32G X 4 MM Misc Generic drug:  Insulin Pen Needle Test two times daily   bumetanide 1 MG tablet Commonly known as:  BUMEX TAKE 1 TABLET(1 MG) BY MOUTH TWICE DAILY   BYSTOLIC 10 MG tablet Generic drug:  nebivolol TAKE 1 TABLET BY MOUTH  DAILY   CALCIUM 500/D 500-400 MG-UNIT Chew Generic drug:  Calcium Carb-Cholecalciferol Chew 600 mg by mouth 2 (two) times daily.   clotrimazole-betamethasone  cream Commonly known as:  LOTRISONE Apply 1 application topically 2 (two) times daily as needed.   dicyclomine 20 MG tablet Commonly known as:  BENTYL Take 10-20 mg by mouth every 8 (eight) hours as needed for spasms (cramps).   diltiazem 120 MG tablet Commonly known as:  CARDIZEM Take 1 tablet (120 mg total) by mouth daily.   diphenhydrAMINE 25 MG tablet Commonly known as:  BENADRYL Take 25 mg by mouth daily as needed for allergies.   EPIPEN 2-PAK 0.3 mg/0.3 mL Soaj injection Generic drug:  EPINEPHrine INJECT 1 PEN INTO MUSCLE OF LATERAL THIGH FOR SEVERE ALLERGIC REACTION. CALL 911 AFTER USING THIS MEDICATION.   Fluticasone-Salmeterol 250-50 MCG/DOSE Aepb Commonly known as:  ADVAIR Inhale 1 puff into the lungs 2 (two) times daily.   gabapentin 100 MG capsule Commonly known as:  NEURONTIN TAKE 1 TO 2 CAPSULES(100 TO 200 MG) BY MOUTH AT BEDTIME What changed:  See the new instructions.   insulin glargine 100 unit/mL Sopn Commonly known as:  LANTUS Inject 0.4 mLs (40 Units total) into the skin at bedtime. Place on hold   levalbuterol 1.25 MG/3ML nebulizer solution Commonly known as:  XOPENEX Take 1.25 mg by nebulization every 8 (eight) hours as needed for wheezing or shortness of breath.   levothyroxine 75 MCG tablet Commonly known as:  SYNTHROID, LEVOTHROID TAKE 1 TABLET BY MOUTH  DAILY    Magnesium 500 MG Tabs Take 500 mg by mouth daily as needed (spasms).   methocarbamol 500 MG tablet Commonly known as:  ROBAXIN Take 1 tablet (500 mg total) by mouth 3 (three) times daily.   multivitamin with minerals Tabs tablet Take 1 tablet by mouth daily.   nystatin cream Commonly known as:  MYCOSTATIN Apply 1 application topically 2 (two) times daily. What changed:    when to take this  reasons to take this   OCUVITE EYE + MULTI Tabs Take 1 tablet by mouth daily.   ondansetron 4 MG tablet Commonly known as:  ZOFRAN Take 1 tablet (4 mg total) by mouth daily as needed for nausea or vomiting.   oxyCODONE-acetaminophen 5-325 MG tablet Commonly known as:  PERCOCET Take 1-2 tablets by mouth every 4 (four) hours as needed for severe pain.   pantoprazole 40 MG tablet Commonly known as:  PROTONIX TAKE 1 TABLET BY MOUTH  EVERY MORNING AS DIRECTED   pravastatin 40 MG tablet Commonly known as:  PRAVACHOL Take 1 tablet (40 mg total) by mouth at bedtime.   senna-docusate 8.6-50 MG tablet Commonly known as:  Senokot-S Take 1 tablet by mouth daily as needed for mild constipation.   tiotropium 18 MCG inhalation capsule Commonly known as:  SPIRIVA Place 18 mcg into inhaler and inhale daily. Inhale the contents of 1 capsule via handhaler       Diagnostic Studies: Dg Chest 2 View  Result Date: 03/02/2018 CLINICAL DATA:  Preop for shoulder surgery EXAM: CHEST - 2 VIEW COMPARISON:  CT right shoulder of 12/31/2015 and chest x-ray of 05/05/2017 FINDINGS: No active infiltrate or effusion is seen. Mediastinal and hilar contours are unchanged. Cardiomegaly is stable and permanent pacemaker remains. No acute bony abnormality is seen. IMPRESSION: Stable cardiomegaly with permanent pacemaker. No active lung disease. Electronically Signed   By: Ivar Drape M.D.   On: 03/02/2018 14:08   Dg Shoulder Right Port  Result Date: 03/10/2018 CLINICAL DATA:  Right total shoulder arthroplasty  EXAM: PORTABLE RIGHT SHOULDER COMPARISON:  11/19/2017 right shoulder radiograph FINDINGS: Status post right  total shoulder arthroplasty, with well-positioned right proximal humeral and right glenoid prostheses on this single frontal view. No right acromioclavicular separation. No evidence of right glenohumeral dislocation. No suspicious focal osseous lesions. No acute osseous fracture. IMPRESSION: Satisfactory single frontal view appearance status post right total shoulder arthroplasty. Electronically Signed   By: Ilona Sorrel M.D.   On: 03/10/2018 11:29    Disposition: Discharge disposition: 01-Home or Self Care       Discharge Instructions    Call MD / Call 911   Complete by:  As directed    If you experience chest pain or shortness of breath, CALL 911 and be transported to the hospital emergency room.  If you develope a fever above 101 F, pus (white drainage) or increased drainage or redness at the wound, or calf pain, call your surgeon's office.   Constipation Prevention   Complete by:  As directed    Drink plenty of fluids.  Prune juice may be helpful.  You may use a stool softener, such as Colace (over the counter) 100 mg twice a day.  Use MiraLax (over the counter) for constipation as needed.   Diet - low sodium heart healthy   Complete by:  As directed    Increase activity slowly as tolerated   Complete by:  As directed       Follow-up Information    [name], MD. Schedule an appointment as soon as possible for a visit in 2 weeks.   Specialty:  Orthopedic Surgery Contact information: [address] [place] [address] [phone]            Signed: Grier Mitts 03/11/2018, 8:30 AM

## 2018-03-17 ENCOUNTER — Encounter: Payer: Self-pay | Admitting: Family Medicine

## 2018-03-17 ENCOUNTER — Telehealth: Payer: Self-pay | Admitting: *Deleted

## 2018-03-17 ENCOUNTER — Ambulatory Visit (INDEPENDENT_AMBULATORY_CARE_PROVIDER_SITE_OTHER): Payer: Medicare Other | Admitting: Family Medicine

## 2018-03-17 VITALS — BP 128/70 | HR 76 | Temp 98.2°F | Ht 61.5 in | Wt 216.0 lb

## 2018-03-17 DIAGNOSIS — J029 Acute pharyngitis, unspecified: Secondary | ICD-10-CM

## 2018-03-17 DIAGNOSIS — T148XXA Other injury of unspecified body region, initial encounter: Secondary | ICD-10-CM

## 2018-03-17 DIAGNOSIS — F329 Major depressive disorder, single episode, unspecified: Secondary | ICD-10-CM | POA: Diagnosis not present

## 2018-03-17 DIAGNOSIS — R05 Cough: Secondary | ICD-10-CM

## 2018-03-17 DIAGNOSIS — R059 Cough, unspecified: Secondary | ICD-10-CM

## 2018-03-17 DIAGNOSIS — F32A Depression, unspecified: Secondary | ICD-10-CM

## 2018-03-17 MED ORDER — BUPROPION HCL ER (XL) 150 MG PO TB24: 150.0000 mg | ORAL_TABLET | ORAL | 2 refills | Status: DC

## 2018-03-17 NOTE — Progress Notes (Signed)
Subjective:    Patient ID: Tanya Hogan, female    DOB: 1951/01/22, 67 y.o.   MRN: [id number]  HPI  Pt is a 67 yr old female presenting to the clinic complaining of a cough. Pt had a right total shoulder arthroplasty 03/10/18 and was intubated at the time. Since then she has been having a constant cough. She was told to gargle with salt water as needed and if that has not improved to see her PCP.  She says is always worse in the morning and then gets a little bit better in the afternoon.   Pt reports coughing up phlegm w/ a ting of blood Pt says she has tried antibacterial tooth paste and gargling salt water w/ no relief. Pt also reports feeling diaphoretic. But denies fever, chills, CP, SOB.   Pt reports feeling down due to not being able to sleep. She has a hx of OSA and was unable to get her BiPAP due to insurance not covering it. She says she has not been sleeping and has been feeling down because of this. No SI/HI.  In fact she is been quite tearful about it.  She also has some bruising on her right inner arm from her surgery but wanted to make sure that it looked okay.  The bruising goes all the way down to her elbow.  Review of Systems  Constitutional: Positive for diaphoresis. Negative for chills and fever.  HENT: Positive for postnasal drip and sore throat. Negative for congestion, ear discharge, ear pain, sinus pressure, sinus pain and sneezing.   Eyes: Negative.   Respiratory: Positive for cough (productive). Negative for chest tightness, shortness of breath and wheezing.   Cardiovascular: Negative for chest pain, palpitations and leg swelling.  Psychiatric/Behavioral: Positive for dysphoric mood.  All other systems reviewed and are negative.  BP 128/70   Pulse 76   Temp 98.2 F (36.8 C)   Ht 5' 1.5" (1.562 m)   Wt 216 lb (98 kg)   BMI 40.15 kg/m     Allergies  Allergen Reactions  . Penicillins Anaphylaxis    Has patient had a PCN reaction causing immediate rash,  facial/tongue/throat swelling, SOB or lightheadedness with hypotension: Yes Has patient had a PCN reaction causing severe rash involving mucus membranes or skin necrosis: No Has patient had a PCN reaction that required hospitalization: No Has patient had a PCN reaction occurring within the last 10 years: No If all of the above answers are "NO", then may proceed with Cephalosporin use.   . Naproxen Swelling  . Accupril [Quinapril Hcl] Other (See Comments)    Hyperkalemia  . Aspirin Other (See Comments)    Pt states "burns holes in stomach", ulcers   . Atorvastatin Other (See Comments)    msucle aches  . Baking Soda-Fluoride [Sodium Fluoride] Itching  . Celebrex [Celecoxib]     ulcers  . Codeine Nausea And Vomiting  . Fluticasone Other (See Comments)    Nasal ulcer.    . Furosemide Other (See Comments)    Nose bleed  . Lactose Intolerance (Gi) Other (See Comments)    Increased calcium, sneezing, nasal issues  . Levocetirizine     Doesn't recall  . Losartan Potassium Itching  . Metformin And Related Diarrhea  . Nasonex [Mometasone Furoate] Other (See Comments)    nosebleed  . Rifampin Other (See Comments)    DOES NOT REMEMBER THIS MEDICATION OR ALLERGY  . Clindamycin/Lincomycin Rash  . Iodinated Diagnostic Agents Itching and Rash  Pt states she has had ultrasound with dye but was fine  . Lanolin-Petrolatum Rash  . Miralax [Polyethylene Glycol] Rash  . Quinine Rash  . Sulfa Antibiotics Rash  . Sulfonamide Derivatives Rash  . Tramadol Rash    Anxiety  . Wool Alcohol [Lanolin] Rash  . Zoledronic Acid Other (See Comments) and Rash    shaking    Past Medical History:  Diagnosis Date  . Alkaline phosphatase elevation   . Asthma   . Breast cancer ([place]) 2011  . Carpal tunnel syndrome   . Carpal tunnel syndrome    bilateral  . CHF (congestive heart failure) ([place])   . Cirrhosis, non-alcoholic ([place])   . Closed fracture of unspecified part of radius (alone)   . DDD  (degenerative disc disease), lumbar   . Depression    pt denies  . Diabetes mellitus without complication ([place])   . Enlarged heart   . Fibromyalgia   . GERD (gastroesophageal reflux disease)   . History of kidney stones   . HTN (hypertension)   . Hypothyroidism   . IBS (irritable bowel syndrome)   . LBBB (left bundle branch block)   . Microcytic anemia 05/14/2017  . Pneumonia   . Presence of permanent cardiac pacemaker   . Sleep apnea    uses cpap    Past Surgical History:  Procedure Laterality Date  . BREAST LUMPECTOMY Left 2011  . COLONOSCOPY    . FOOT SURGERY    . KNEE SURGERY    . PACEMAKER GENERATOR CHANGE N/A 08/23/2014   Procedure: PACEMAKER GENERATOR CHANGE;  Surgeon: Evans Lance, MD;  Location[facility] C[facility] LAB;  Service: Cardiovascular;  Laterality: N/A;  . PACEMAKER PLACEMENT  2009   [facility]  . RECTAL SURGERY  1976  . TOTAL SHOULDER ARTHROPLASTY Right 03/10/2018  . TOTAL SHOULDER ARTHROPLASTY Right 03/10/2018   Procedure: RIGHT TOTAL SHOULDER ARTHROPLASTY;  Surgeon: [name], MD;  Location: [facility];  Service: Orthopedics;  Laterality: Right;  . TUBAL LIGATION      Social History   Socioeconomic History  . Marital status: Divorced    Spouse name: Not on file  . Number of children: Not on file  . Years of education: Not on file  . Highest education level: Not on file  Occupational History  . Not on file  Social Needs  . Financial resource strain: Not on file  . Food insecurity:    Worry: Not on file    Inability: Not on file  . Transportation needs:    Medical: Not on file    Non-medical: Not on file  Tobacco Use  . Smoking status: Former Smoker    Last attempt to quit: 08/24/1966    Years since quitting: 51.5  . Smokeless tobacco: Never Used  Substance and Sexual Activity  . Alcohol use: No  . Drug use: No  . Sexual activity: Not Currently  Lifestyle  . Physical activity:    Days per week: Not on file    Minutes per session: Not  on file  . Stress: Not on file  Relationships  . Social connections:    Talks on phone: Not on file    Gets together: Not on file    Attends religious service: Not on file    Active member of club or organization: Not on file    Attends meetings of clubs or organizations: Not on file    Relationship status: Not on file  . Intimate partner violence:  Fear of current or ex partner: Not on file    Emotionally abused: Not on file    Physically abused: Not on file    Forced sexual activity: Not on file  Other Topics Concern  . Not on file  Social History Narrative  . Not on file    Family History  Problem Relation Age of Onset  . Melanoma Mother   . Heart failure Unknown   . Breast cancer Unknown   . Hypertension Unknown     Outpatient Encounter Medications as of 03/17/2018  Medication Sig  . ACCU-CHEK FASTCLIX LANCETS MISC Test twice a day  . ACCU-CHEK SMARTVIEW test strip TEST TWO TIMES DAILY  . albuterol (PROVENTIL HFA;VENTOLIN HFA) 108 (90 Base) MCG/ACT inhaler Inhale 2 puffs into the lungs every 4 (four) hours as needed for wheezing or shortness of breath.  [name] alendronate (FOSAMAX) 70 MG tablet Take 1 tablet (70 mg total) by mouth every 7 (seven) days. Take with a full glass of water on an empty stomach.  . AMBULATORY NON FORMULARY MEDICATION Medication Name: Needs tubing and suplies for her nebulizer machine. Dx COPD. [facility]  . AMBULATORY NON FORMULARY MEDICATION Fastclix lancets Dx: E11.9  . AMBULATORY NON FORMULARY MEDICATION Medication Name:[employer]ibetic Footwear. Dx: E11.8 DM type 2; M79.671,M79.672 Bilateral foot pain.  [name] AMBULATORY NON FORMULARY MEDICATION Rolling 4 post [place] with a seat  . BD PEN NEEDLE NANO U/F 32G X 4 MM MISC Test two times daily  . Blood Glucose Monitoring Suppl (ACCU-CHEK NANO SMARTVIEW) w/Device KIT To be used to test blood sugars two times daily. Dx:E11.8  . bumetanide (BUMEX) 1 MG tablet TAKE 1 TABLET(1 MG) BY MOUTH TWICE DAILY  .  buPROPion (WELLBUTRIN XL) 150 MG 24 hr tablet Take 1 tablet (150 mg total) by mouth every morning.  [name] BYSTOLIC 10 MG tablet TAKE 1 TABLET BY MOUTH  DAILY  . Calcium Carb-Cholecalciferol (CALCIUM 500/D) 500-400 MG-UNIT CHEW Chew 600 mg by mouth 2 (two) times daily.  . clotrimazole-betamethasone (LOTRISONE) cream Apply 1 application topically 2 (two) times daily as needed.   . dicyclomine (BENTYL) 20 MG tablet Take 10-20 mg by mouth every 8 (eight) hours as needed for spasms (cramps).   [name] diltiazem (CARDIZEM) 120 MG tablet Take 1 tablet (120 mg total) by mouth daily.  . diphenhydrAMINE (BENADRYL) 25 MG tablet Take 25 mg by mouth daily as needed for allergies.  [name] EPIPEN 2-PAK 0.3 MG/0.3ML SOAJ injection INJECT 1 PEN INTO MUSCLE OF LATERAL THIGH FOR SEVERE ALLERGIC REACTION. CALL 911 AFTER USING THIS MEDICATION.  [name] Fluticasone-Salmeterol (ADVAIR) 250-50 MCG/DOSE AEPB Inhale 1 puff into the lungs 2 (two) times daily.  [name] gabapentin (NEURONTIN) 100 MG capsule TAKE 1 TO 2 CAPSULES(100 TO 200 MG) BY MOUTH AT BEDTIME (Patient taking differently: Take 100 - 200 mg at bedtime as needed for pain)  . insulin glargine (LANTUS) 100 unit/mL SOPN Inject 0.4 mLs (40 Units total) into the skin at bedtime. Place on hold  . levalbuterol (XOPENEX) 1.25 MG/3ML nebulizer solution Take 1.25 mg by nebulization every 8 (eight) hours as needed for wheezing or shortness of breath.   . levothyroxine (SYNTHROID, LEVOTHROID) 75 MCG tablet TAKE 1 TABLET BY MOUTH  DAILY  . Magnesium 500 MG TABS Take 500 mg by mouth daily as needed (spasms).  . methocarbamol (ROBAXIN) 500 MG tablet Take 1 tablet (500 mg total) by mouth 3 (three) times daily.  . Multiple Vitamin (MULTIVITAMIN WITH MINERALS) TABS tablet Take 1 tablet by  mouth daily.  . Multiple Vitamins-Minerals (OCUVITE EYE + MULTI) TABS Take 1 tablet by mouth daily.  [name] nystatin cream (MYCOSTATIN) Apply 1 application topically 2 (two) times daily. (Patient taking differently: Apply 1  application topically 2 (two) times daily as needed (rash). )  . ondansetron (ZOFRAN) 4 MG tablet Take 1 tablet (4 mg total) by mouth daily as needed for nausea or vomiting.  [name] oxyCODONE-acetaminophen (PERCOCET) 5-325 MG tablet Take 1-2 tablets by mouth every 4 (four) hours as needed for severe pain.  . pantoprazole (PROTONIX) 40 MG tablet TAKE 1 TABLET BY MOUTH  EVERY MORNING AS DIRECTED  . pravastatin (PRAVACHOL) 40 MG tablet Take 1 tablet (40 mg total) by mouth at bedtime.  . senna-docusate (SENOKOT-S) 8.6-50 MG tablet Take 1 tablet by mouth daily as needed for mild constipation.  [name] tiotropium (SPIRIVA) 18 MCG inhalation capsule Place 18 mcg into inhaler and inhale daily. Inhale the contents of 1 capsule via handhaler   No facility-administered encounter medications on file as of 03/17/2018.          Objective:   Physical Exam  Constitutional: She is oriented to person, place, and time. She appears well-developed and well-nourished. No distress.  HENT:  Head: Normocephalic and atraumatic.  Right Ear: External ear normal.  Left Ear: External ear normal.  TMs and canals are clear bilaterally.  Eyes: Pupils are equal, round, and reactive to light. Conjunctivae and EOM are normal.  Neck: Normal range of motion. No thyromegaly present.  Cardiovascular: Normal rate, regular rhythm and normal heart sounds.  Pulmonary/Chest: Effort normal and breath sounds normal. No respiratory distress. She has no wheezes. She has no rales. She exhibits no tenderness.  Neurological: She is alert and oriented to person, place, and time.  Skin: Skin is warm and dry.  Significant and extensive bruising of the left inner arm all the way down to the elbow.  I did not feel any nodules or hardening of the skin.  No significant erythema.  Starting to turn a yellowish color which means it is healing.      Assessment & Plan:  Marland KitchenMarland KitchenDiagnoses and all orders for this visit:  Sore throat  Cough  Bruising  Acute  depression  Other orders -     buPROPion (WELLBUTRIN XL) 150 MG 24 hr tablet; Take 1 tablet (150 mg total) by mouth every morning.   -Pt told this may be related to her being intubated. Pt encouraged to use Maalox as needed and lozenges for pain relief.  I would normally send over Magic mouthwash but with Medicare they usually will not cover the prescription since it is compounded.  Pt told that PNA is a common side effect post surgery. Low suspicion for PNA due to no fever, chills, SOB, and normal pulmonary exam. Pt encouraged to follow up if she has worsening symptoms, or fever chills, hoarseness, SOB, CP. Pt voiced understanding.   -Acute depression-we will treat with Wellbutrin which is mild.  Its temporary and should hopefully help.  Pt requesting medicine for mood since not being able to sleep. Will follow up this.  We can certainly see if we can be of any assistance in helping her get her BiPAP.  Bruising and hematoma right in her arm.  Gave reassurance that I think this is normal post surgery and I did not feel any thickening or hardening of the skin.  No erythema or rash.

## 2018-03-17 NOTE — Patient Instructions (Signed)
Try to gargle and spit Maalox 3 times a day as needed.  Can you chloroseptic spray.   Call if not better.

## 2018-03-17 NOTE — Telephone Encounter (Signed)
Pt reports that since being discharged from hospital she has been having lumpy white stuff that she has been coughing up, no fever. She has been using salt water and a toothpaste to help with this. This is not helping much she stated she has been using her CPAP as much as possible. She was very tearful. I asked if she had made a f/u appt with Dr. Madilyn Fireman for a hospital f/u she has not. I informed her that Dr. Madilyn Fireman does NOT have ANY openings and since she has NOT been released from her surgeon I feel that it would be best to just see her for an ACUTE issue which is her mouth. She voiced understanding and agreed. I told her that I would see what I could work out and call her back.Marland KitchenMarland KitchenElouise Munroe, [place]

## 2018-03-17 NOTE — Telephone Encounter (Signed)
Called pt and informed her that appt made for today @ 420 she will need to check in at 410. Pt stated that she can remember 400 better. I said that will be fine we will see her then.Elouise Munroe, [place]

## 2018-03-23 DIAGNOSIS — M19011 Primary osteoarthritis, right shoulder: Secondary | ICD-10-CM | POA: Diagnosis not present

## 2018-03-29 ENCOUNTER — Telehealth: Payer: Self-pay | Admitting: *Deleted

## 2018-03-29 NOTE — Telephone Encounter (Signed)
Pt reports that she stopped taking the wellbutrin, she said it caused her skin to crawl, and have aches and pains and it just did not help with her mood. She said she only took 9 tablets. This was added to her allergy list..Marland KitchenMarland KitchenElouise Hogan, [place]

## 2018-03-30 ENCOUNTER — Ambulatory Visit (INDEPENDENT_AMBULATORY_CARE_PROVIDER_SITE_OTHER): Payer: Medicare Other | Admitting: *Deleted

## 2018-03-30 DIAGNOSIS — I442 Atrioventricular block, complete: Secondary | ICD-10-CM

## 2018-03-30 DIAGNOSIS — I5022 Chronic systolic (congestive) heart failure: Secondary | ICD-10-CM

## 2018-03-31 NOTE — Progress Notes (Signed)
Remote pacemaker transmission.   

## 2018-04-06 DIAGNOSIS — G4733 Obstructive sleep apnea (adult) (pediatric): Secondary | ICD-10-CM | POA: Diagnosis not present

## 2018-04-06 DIAGNOSIS — M5030 Other cervical disc degeneration, unspecified cervical region: Secondary | ICD-10-CM | POA: Diagnosis not present

## 2018-04-06 DIAGNOSIS — G4737 Central sleep apnea in conditions classified elsewhere: Secondary | ICD-10-CM | POA: Diagnosis not present

## 2018-04-07 DIAGNOSIS — D3131 Benign neoplasm of right choroid: Secondary | ICD-10-CM | POA: Diagnosis not present

## 2018-04-07 LAB — HM DIABETES EYE EXAM

## 2018-04-19 LAB — CUP PACEART REMOTE DEVICE CHECK
Battery Voltage: 2.99 V
Brady Statistic AP VP Percent: 42.15 %
Brady Statistic AP VS Percent: 0.35 %
Brady Statistic AS VP Percent: 56.47 %
Brady Statistic RA Percent Paced: 42.13 %
Brady Statistic RV Percent Paced: 98.04 %
Implantable Lead Implant Date: 20030324
Implantable Lead Location: 753858
Implantable Lead Location: 753859
Implantable Lead Location: 753860
Implantable Lead Model: 4068
Implantable Lead Model: 4068
Lead Channel Impedance Value: 1235 Ohm
Lead Channel Impedance Value: 4047 Ohm
Lead Channel Impedance Value: 4047 Ohm
Lead Channel Impedance Value: 4047 Ohm
Lead Channel Impedance Value: 418 Ohm
Lead Channel Impedance Value: 418 Ohm
Lead Channel Pacing Threshold Amplitude: 1 V
Lead Channel Pacing Threshold Amplitude: 1.875 V
Lead Channel Sensing Intrinsic Amplitude: 1.25 mV
Lead Channel Sensing Intrinsic Amplitude: 1.25 mV
Lead Channel Sensing Intrinsic Amplitude: 6.5 mV
Lead Channel Setting Pacing Amplitude: 1.5 V
Lead Channel Setting Pacing Amplitude: 3.5 V
Lead Channel Setting Pacing Pulse Width: 0.8 ms
Lead Channel Setting Sensing Sensitivity: 4 mV
MDC IDC LEAD IMPLANT DT: 20030324
MDC IDC LEAD IMPLANT DT: 20030324
MDC IDC MSMT BATTERY REMAINING LONGEVITY: 43 mo
MDC IDC MSMT LEADCHNL LV IMPEDANCE VALUE: 1045 Ohm
MDC IDC MSMT LEADCHNL RA IMPEDANCE VALUE: 418 Ohm
MDC IDC MSMT LEADCHNL RA PACING THRESHOLD PULSEWIDTH: 0.4 ms
MDC IDC MSMT LEADCHNL RV IMPEDANCE VALUE: 551 Ohm
MDC IDC MSMT LEADCHNL RV PACING THRESHOLD PULSEWIDTH: 0.4 ms
MDC IDC MSMT LEADCHNL RV SENSING INTR AMPL: 6.5 mV
MDC IDC PG IMPLANT DT: 20151231
MDC IDC SESS DTM: 20190807131303
MDC IDC SET LEADCHNL LV PACING AMPLITUDE: 2.5 V
MDC IDC SET LEADCHNL RV PACING PULSEWIDTH: 0.8 ms
MDC IDC STAT BRADY AS VS PERCENT: 1.03 %

## 2018-04-20 DIAGNOSIS — M19011 Primary osteoarthritis, right shoulder: Secondary | ICD-10-CM | POA: Diagnosis not present

## 2018-04-22 DIAGNOSIS — Z96611 Presence of right artificial shoulder joint: Secondary | ICD-10-CM | POA: Diagnosis not present

## 2018-04-27 DIAGNOSIS — G4733 Obstructive sleep apnea (adult) (pediatric): Secondary | ICD-10-CM | POA: Diagnosis not present

## 2018-04-28 DIAGNOSIS — Z96611 Presence of right artificial shoulder joint: Secondary | ICD-10-CM | POA: Diagnosis not present

## 2018-05-05 DIAGNOSIS — Z96611 Presence of right artificial shoulder joint: Secondary | ICD-10-CM | POA: Diagnosis not present

## 2018-05-07 ENCOUNTER — Other Ambulatory Visit: Payer: Self-pay | Admitting: Internal Medicine

## 2018-05-07 DIAGNOSIS — G4733 Obstructive sleep apnea (adult) (pediatric): Secondary | ICD-10-CM | POA: Diagnosis not present

## 2018-05-12 DIAGNOSIS — Z96611 Presence of right artificial shoulder joint: Secondary | ICD-10-CM | POA: Diagnosis not present

## 2018-05-19 DIAGNOSIS — Z96611 Presence of right artificial shoulder joint: Secondary | ICD-10-CM | POA: Diagnosis not present

## 2018-05-23 ENCOUNTER — Ambulatory Visit: Payer: Medicare Other | Admitting: Family Medicine

## 2018-05-24 ENCOUNTER — Ambulatory Visit (INDEPENDENT_AMBULATORY_CARE_PROVIDER_SITE_OTHER): Payer: Medicare Other | Admitting: Family Medicine

## 2018-05-24 ENCOUNTER — Encounter: Payer: Self-pay | Admitting: Family Medicine

## 2018-05-24 VITALS — BP 135/58 | HR 84 | Ht 62.0 in | Wt 224.0 lb

## 2018-05-24 DIAGNOSIS — Z6841 Body Mass Index (BMI) 40.0 and over, adult: Secondary | ICD-10-CM

## 2018-05-24 DIAGNOSIS — I10 Essential (primary) hypertension: Secondary | ICD-10-CM | POA: Diagnosis not present

## 2018-05-24 DIAGNOSIS — I5022 Chronic systolic (congestive) heart failure: Secondary | ICD-10-CM

## 2018-05-24 DIAGNOSIS — Z23 Encounter for immunization: Secondary | ICD-10-CM

## 2018-05-24 DIAGNOSIS — E7800 Pure hypercholesterolemia, unspecified: Secondary | ICD-10-CM

## 2018-05-24 DIAGNOSIS — E118 Type 2 diabetes mellitus with unspecified complications: Secondary | ICD-10-CM | POA: Diagnosis not present

## 2018-05-24 DIAGNOSIS — D649 Anemia, unspecified: Secondary | ICD-10-CM

## 2018-05-24 DIAGNOSIS — E038 Other specified hypothyroidism: Secondary | ICD-10-CM

## 2018-05-24 LAB — CBC
HEMATOCRIT: 40.7 % (ref 35.0–45.0)
HEMOGLOBIN: 13.2 g/dL (ref 11.7–15.5)
MCH: 29.3 pg (ref 27.0–33.0)
MCHC: 32.4 g/dL (ref 32.0–36.0)
MCV: 90.2 fL (ref 80.0–100.0)
MPV: 10.6 fL (ref 7.5–12.5)
Platelets: 321 10*3/uL (ref 140–400)
RBC: 4.51 10*6/uL (ref 3.80–5.10)
RDW: 12.3 % (ref 11.0–15.0)
WBC: 7.2 10*3/uL (ref 3.8–10.8)

## 2018-05-24 LAB — BASIC METABOLIC PANEL WITH GFR
BUN: 12 mg/dL (ref 7–25)
CALCIUM: 10 mg/dL (ref 8.6–10.4)
CO2: 26 mmol/L (ref 20–32)
Chloride: 103 mmol/L (ref 98–110)
Creat: 0.81 mg/dL (ref 0.50–0.99)
GFR, EST NON AFRICAN AMERICAN: 74 mL/min/{1.73_m2} (ref >=60)
GFR, Est African American: 86 mL/min/{1.73_m2} (ref >=60)
GLUCOSE: 173 mg/dL — AB (ref 65–139)
Potassium: 4.7 mmol/L (ref 3.5–5.3)
SODIUM: 138 mmol/L (ref 135–146)

## 2018-05-24 LAB — TSH: TSH: 2.09 m[IU]/L (ref 0.40–4.50)

## 2018-05-24 LAB — POCT GLYCOSYLATED HEMOGLOBIN (HGB A1C): HEMOGLOBIN A1C: 6.9 % — AB (ref 4.0–5.6)

## 2018-05-24 MED ORDER — PRAVASTATIN SODIUM 40 MG PO TABS: 40.0000 mg | ORAL_TABLET | Freq: Every day | ORAL | 3 refills | Status: DC

## 2018-05-24 NOTE — Patient Instructions (Signed)
Decrease you Lantus to 36 units nightly and call me in about 2 weeks with your blood sugars so we can adjust your dose again if needed.

## 2018-05-24 NOTE — Progress Notes (Signed)
Subjective:    CC: DM  HPI:  67 year old female had right shoulder total arthroplasty in July and the following week ended up getting a blood transfusion.  Really been working hard to eating iron rich diet but says she still feels a little weak and tired like she is still anemic.  Diabetes - no hypoglycemic events. No wounds or sores that are not healing well. No increased thirst or urination. Checking glucose at home. Taking medications as prescribed without any side effects.  Feels like she needs a refresher course on diabetes and nutrition.  He is actually been having several low blood sugars in the evening and so when she has a low she will actually skip her insulin and in the next day it is too high.  She says in the last month she is probably had about 10 lows in the evening where she is completely skipped her insulin.  She is very fearful of having hypoglycemic event in the morning  Hypertension- Pt denies chest pain, SOB, dizziness, or heart palpitations.  Taking meds as directed w/o problems.  Denies medication side effects.    She also complains about headaches today. She c/o of frontal HA that she thinks is coming from her mask on her BiPAP. She plans on seeing her pulm who manages this for her next week.   She is still struggling with her weight. She has had a lot of bloating too.    Past medical history, Surgical history, Family history not pertinant except as noted below, Social history, Allergies, and medications have been entered into the medical record, reviewed, and corrections made.   Review of Systems: No fevers, chills, night sweats, weight loss, chest pain, or shortness of breath.   Objective:    General: Well Developed, well nourished, and in no acute distress.  Neuro: Alert and oriented x3, extra-ocular muscles intact, sensation grossly intact.  HEENT: Normocephalic, atraumatic  Skin: Warm and dry, no rashes. Cardiac: Regular rate and rhythm, no murmurs rubs or  gallops, no lower extremity edema.  Respiratory: Clear to auscultation bilaterally. Not using accessory muscles, speaking in full sentences.   Impression and Recommendations:    DM - A1C is borderline at 6.9.  She is having several lows but we discussed options.  Since she is having having several hypoglycemic events we will decrease her Lantus to 36 units and see if that eliminates the hypoglycemia which in turn should help her not completely skip her medication.  We also discussed that if she is having a lower blood sugar it makes more sense to decrease her insulin by about 10 units and still give it then to not take it at all.[name]    HTN - Well controlled. Continue current regimen. Follow up in  3-4 months.    Headaches -plans on following up with her pulmonologist so that they can maybe make some adjustments to her BiPAP.  She actually has an appointment on Friday to go and have her machine checked and [facility] so she is feeling like she is having some electronic issues.  She should certainly would also request a different mask while she is there as well.  Anemia status post post surgery-she is now 2 months out from a blood transfusion would like to check CBC today just to make sure that she is no longer anemic and she is been able to maintain her hemoglobin.  10 you with iron rich diet.  Hypothyroid - due to recheck level.  Flu shot given.  Due for Pneumovax 23 given.

## 2018-06-06 DIAGNOSIS — G4733 Obstructive sleep apnea (adult) (pediatric): Secondary | ICD-10-CM | POA: Diagnosis not present

## 2018-06-10 DIAGNOSIS — Z96611 Presence of right artificial shoulder joint: Secondary | ICD-10-CM | POA: Diagnosis not present

## 2018-06-10 DIAGNOSIS — Z09 Encounter for follow-up examination after completed treatment for conditions other than malignant neoplasm: Secondary | ICD-10-CM | POA: Diagnosis not present

## 2018-06-10 DIAGNOSIS — M25511 Pain in right shoulder: Secondary | ICD-10-CM | POA: Diagnosis not present

## 2018-06-13 ENCOUNTER — Encounter: Payer: Self-pay | Admitting: Registered"

## 2018-06-13 ENCOUNTER — Encounter: Payer: Medicare Other | Attending: Family Medicine | Admitting: Registered"

## 2018-06-13 DIAGNOSIS — E118 Type 2 diabetes mellitus with unspecified complications: Secondary | ICD-10-CM

## 2018-06-13 DIAGNOSIS — E7800 Pure hypercholesterolemia, unspecified: Secondary | ICD-10-CM | POA: Diagnosis not present

## 2018-06-13 DIAGNOSIS — Z713 Dietary counseling and surveillance: Secondary | ICD-10-CM | POA: Diagnosis not present

## 2018-06-13 NOTE — Patient Instructions (Addendum)
   More vegetables in your diet will help with blood sugar as well as overall health.  Visit farmers markets for fresh fruits and vegetables.  Look into SNAP benefits to see if you qualify, and if so, see if farmers market near you take the SNAP benefits and may even double them.  Please go to this website to help you find fresh food: [place]: https://findfood.BuyingShow.uy  Aim to eat balanced meals and snacks, having protein when eating carbs  Look through the snack sheet for ideas for balanced snacks  Continue with the stretching and movement that you are doing daily. You can also consider Arm Chair exercises in the handout provided.  If Danton Clap breakfast sandwiches are a regular go-to, consider the [employer] delights version, they have less saturated fat.

## 2018-06-13 NOTE — Progress Notes (Signed)
Diabetes Self-Management Education  Visit Type: First/Initial  Appt. Start Time: 1030 Appt. End Time: 4540  06/13/2018  Ms. Jackalyn Lombard, identified by name and date of birth, is a 67 y.o. female with a diagnosis of Diabetes: Type 2.   ASSESSMENT Pt states she has a goal to get her A1c in the 5's. Pt also states she has a fear of going to low and will eat cereal as her dinner if her evening BG is in the low 100's. Pt states she was not aware of how to treat low blood sugar.  Pt states due to arthritis and inflammation she avoids night shades, potatoes, tomatoes, eggplant, cucumbers,onions & garlic, learned from staff member [employer] in [employer] years ago. Pt states store bought tomatoes make her eat more food the next day. Pt states she has to use canned fruit because fruit in the store isn't ripe. Pt states she has had allergy/food sensitivity test done and believes that she is not support to eat many foods including beef, peanuts, soy bean oil and more. Pt states she uses pink Buras salt for the minerals to help with arthritis pain.    Patient states she can eat chicken, pork, fish, nuts. Pt states she mostly eats sandwiches because her brother is a picky eater, she would like to cook chicken more for dinner. Pt states she likes to make a [place] vegetable soup that could last several days, but has to wait now until she gets paid again.  Pt states she has been living with her brother over 34 yrs and is very grateful to him but his complaining about her cooking and eats a lot creates stress for her. Pt states sh has $300/month food budget and runs out of food. Pt states she keeps canned beans & rice in the house.   Pt states she recently had shoulder surgery and is still weak, but has mobility. Pt states she was walking on dirt road, but stopped due to fall risk. Pt states the [place] she is using today is just a safety precaution, but doesn't like using it, cumbersome.  Diabetes  Self-Management Education - 06/13/18 1038      Visit Information   Visit Type  First/Initial      Initial Visit   Diabetes Type  Type 2    Are you currently following a meal plan?  No    Are you taking your medications as prescribed?  Yes    Date Diagnosed  2004?      Health Coping   How would you rate your overall health?  Good      Psychosocial Assessment   Patient Belief/Attitude about Diabetes  Motivated to manage diabetes    How often do you need to have someone help you when you read instructions, pamphlets, or other written materials from your doctor or pharmacy?  1 - Never    What is the last grade level you completed in school?  college      Complications   Last HgB A1C per patient/outside source  6.9 %    How often do you check your blood sugar?  1-2 times/day    Fasting Blood glucose range (mg/dL)  70-129   117-168   Postprandial Blood glucose range (mg/dL)  70-129    Number of hypoglycemic episodes per month  0    Number of hyperglycemic episodes per week  0    Have you had a dilated eye exam in the past 12 months?  Yes    Have you had a dental exam in the past 12 months?  Yes    Are you checking your feet?  Yes    How many days per week are you checking your feet?  7      Dietary Intake   Breakfast  jimmy dean cressant (feeds dog the cheese) OR Engl muffin, 1 tsp butter, jelly, coffee creamer original, stevia    Snack (morning)  none    Lunch  salad, tuna or chicken OR sandwich, tea    Snack (afternoon)  none OR toast, with butter OR almonds OR cheese    Dinner  sandwich OR bowl of cereal    Snack (evening)  bowl of cereal when BG is 111 or 115    Beverage(s)  water, tea,       Exercise   Exercise Type  ADL's    How many days per week to you exercise?  0    How many minutes per day do you exercise?  0    Total minutes per week of exercise  0      Patient Education   Previous Diabetes Education  Yes (please comment)   2004   Nutrition management   Role of  diet in the treatment of diabetes and the relationship between the three main macronutrients and blood glucose level    Physical activity and exercise   Role of exercise on diabetes management, blood pressure control and cardiac health.    Acute complications  Taught treatment of hypoglycemia - the 15 rule.    Psychosocial adjustment  Role of stress on diabetes      Individualized Goals (developed by patient)   Nutrition  General guidelines for healthy choices and portions discussed    Problem Solving  Affordable solutions for getting more fruits and especially vegetables in diet      Outcomes   Expected Outcomes  Demonstrated interest in learning. Expect positive outcomes    Future DMSE  PRN    Program Status  Completed       Individualized Plan for Diabetes Self-Management Training:   Learning Objective:  Patient will have a greater understanding of diabetes self-management. Patient education plan is to attend individual and/or group sessions per assessed needs and concerns.   Patient Instructions   More vegetables in your diet will help with blood sugar as well as overall health.  Visit farmers markets for fresh fruits and vegetables.  Look into SNAP benefits to see if you qualify, and if so, see if farmers market near you take the SNAP benefits and may even double them.  Please go to this website to help you find fresh food: [place]: https://findfood.BuyingShow.uy  Aim to eat balanced meals and snacks, having protein when eating carbs  Look through the snack sheet for ideas for balanced snacks  Continue with the stretching and movement that you are doing daily. You can also consider Arm Chair exercises in the handout provided.  If Danton Clap breakfast sandwiches are a regular go-to, consider the [employer] delights version, they have less saturated fat.  Expected Outcomes:  Demonstrated interest in learning. Expect positive outcomes  Education  material provided: My Plate and Snack sheet , Low blood sugar rule of 15, Arm chair exercises, eating healthy at the dollar store   If problems or questions, patient to contact team via:  Phone  Future DSME appointment: PRN

## 2018-06-15 DIAGNOSIS — Z87891 Personal history of nicotine dependence: Secondary | ICD-10-CM | POA: Diagnosis not present

## 2018-06-15 DIAGNOSIS — R093 Abnormal sputum: Secondary | ICD-10-CM | POA: Diagnosis not present

## 2018-06-15 DIAGNOSIS — G4721 Circadian rhythm sleep disorder, delayed sleep phase type: Secondary | ICD-10-CM | POA: Diagnosis not present

## 2018-06-15 DIAGNOSIS — K7581 Nonalcoholic steatohepatitis (NASH): Secondary | ICD-10-CM | POA: Diagnosis not present

## 2018-06-15 DIAGNOSIS — J4540 Moderate persistent asthma, uncomplicated: Secondary | ICD-10-CM | POA: Diagnosis not present

## 2018-06-15 DIAGNOSIS — R05 Cough: Secondary | ICD-10-CM | POA: Diagnosis not present

## 2018-06-15 DIAGNOSIS — Z9989 Dependence on other enabling machines and devices: Secondary | ICD-10-CM | POA: Diagnosis not present

## 2018-06-15 DIAGNOSIS — I5020 Unspecified systolic (congestive) heart failure: Secondary | ICD-10-CM | POA: Diagnosis not present

## 2018-06-15 DIAGNOSIS — Z7189 Other specified counseling: Secondary | ICD-10-CM | POA: Diagnosis not present

## 2018-06-15 DIAGNOSIS — G4733 Obstructive sleep apnea (adult) (pediatric): Secondary | ICD-10-CM | POA: Diagnosis not present

## 2018-06-16 DIAGNOSIS — Z7189 Other specified counseling: Secondary | ICD-10-CM | POA: Diagnosis not present

## 2018-06-16 DIAGNOSIS — Z9989 Dependence on other enabling machines and devices: Secondary | ICD-10-CM | POA: Diagnosis not present

## 2018-06-16 DIAGNOSIS — K7581 Nonalcoholic steatohepatitis (NASH): Secondary | ICD-10-CM | POA: Diagnosis not present

## 2018-06-16 DIAGNOSIS — R093 Abnormal sputum: Secondary | ICD-10-CM | POA: Diagnosis not present

## 2018-06-16 DIAGNOSIS — R05 Cough: Secondary | ICD-10-CM | POA: Diagnosis not present

## 2018-06-16 DIAGNOSIS — G4721 Circadian rhythm sleep disorder, delayed sleep phase type: Secondary | ICD-10-CM | POA: Diagnosis not present

## 2018-06-16 DIAGNOSIS — I5020 Unspecified systolic (congestive) heart failure: Secondary | ICD-10-CM | POA: Diagnosis not present

## 2018-06-16 DIAGNOSIS — Z87891 Personal history of nicotine dependence: Secondary | ICD-10-CM | POA: Diagnosis not present

## 2018-06-16 DIAGNOSIS — J4540 Moderate persistent asthma, uncomplicated: Secondary | ICD-10-CM | POA: Diagnosis not present

## 2018-06-16 DIAGNOSIS — G4733 Obstructive sleep apnea (adult) (pediatric): Secondary | ICD-10-CM | POA: Diagnosis not present

## 2018-06-19 ENCOUNTER — Other Ambulatory Visit: Payer: Self-pay | Admitting: Family Medicine

## 2018-06-21 DIAGNOSIS — G4733 Obstructive sleep apnea (adult) (pediatric): Secondary | ICD-10-CM | POA: Diagnosis not present

## 2018-06-29 ENCOUNTER — Encounter: Payer: Medicare Other | Admitting: *Deleted

## 2018-06-29 ENCOUNTER — Telehealth: Payer: Self-pay

## 2018-06-29 NOTE — Telephone Encounter (Signed)
Attempted to confirm remote transmission with pt. No answer and was unable to leave a message.   

## 2018-07-04 ENCOUNTER — Ambulatory Visit (INDEPENDENT_AMBULATORY_CARE_PROVIDER_SITE_OTHER): Payer: Medicare Other | Admitting: *Deleted

## 2018-07-04 DIAGNOSIS — I5022 Chronic systolic (congestive) heart failure: Secondary | ICD-10-CM | POA: Diagnosis not present

## 2018-07-04 DIAGNOSIS — I442 Atrioventricular block, complete: Secondary | ICD-10-CM

## 2018-07-05 ENCOUNTER — Ambulatory Visit (INDEPENDENT_AMBULATORY_CARE_PROVIDER_SITE_OTHER): Payer: Medicare Other | Admitting: Physician Assistant

## 2018-07-05 ENCOUNTER — Encounter: Payer: Self-pay | Admitting: Physician Assistant

## 2018-07-05 VITALS — BP 127/60 | HR 69 | Ht 62.0 in | Wt 220.0 lb

## 2018-07-05 DIAGNOSIS — B379 Candidiasis, unspecified: Secondary | ICD-10-CM

## 2018-07-05 MED ORDER — NYSTATIN 100000 UNIT/GM EX CREA: 1.0000 "application " | TOPICAL_CREAM | Freq: Two times a day (BID) | CUTANEOUS | 1 refills | Status: DC

## 2018-07-05 MED ORDER — FLUCONAZOLE 150 MG PO TABS: 150.0000 mg | ORAL_TABLET | Freq: Once | ORAL | 0 refills | Status: AC

## 2018-07-05 NOTE — Progress Notes (Signed)
Subjective:    Patient ID: Tanya Hogan, female    DOB: September 20, 1950, 67 y.o.   MRN: [id number]  HPI  Patient is a 67 year old female with congestive heart failure, hypertension, type 2 diabetes, persistent asthma who presents to the clinic to discuss recent symptoms after finishing a very strong antibiotic linezolid.  She was given this antibiotic by her pulmonologist on 06/22/2018 for 7 days.  She finished antibiotic on 06/29/2018 she started itching and developing rashes all over her body on 11/7.  She has noticed rashes in her axilla, under her breast, in her groin.  She is also had some white to brown vaginal discharge and a lot of vaginal itching.  She also reports a dry mouth and crusty corners of her mouth.  She certainly wonders if she could have a bad yeast infection since being on the antibiotic.  .. Active Ambulatory Problems    Diagnosis Date Noted  . Breast cancer of upper-inner quadrant of left female breast ([place]) 03/18/2010  . Hypothyroidism 10/03/2009  . Hyperlipidemia 11/07/2009  . Major depressive disorder 07/02/2009  . ESSENTIAL HYPERTENSION, BENIGN 07/02/2009  . LEFT BUNDLE BRANCH BLOCK 10/23/2009  . Chronic back pain 07/02/2009  . Fibromyalgia 10/29/2009  . FATIGUE 01/09/2010  . ALKALINE PHOSPHATASE, ELEVATED 10/13/2009  . PACEMAKER, PERMANENT 10/23/2009  . Chronic systolic heart failure ([place]) 05/23/2013  . Obesity, Class II, BMI 35-39.9, with comorbidity 06/09/2013  . Controlled diabetes mellitus type 2 with complications ([place]) 40/98/1191  . NASH (nonalcoholic steatohepatitis) 05/26/2015  . Bilateral foot pain 01/22/2016  . Primary osteoarthritis of right shoulder 02/24/2016  . Gastrointestinal food sensitivity 05/21/2016  . Primary osteoarthritis of left ankle 09/24/2016  . Asthma, moderate persistent 04/18/2013  . Microcytic anemia 05/14/2017  . Peripheral edema 05/19/2017  . Chronic cough 04/18/2013  . Delayed sleep phase syndrome 10/29/2016  . Hyposomnia  11/28/2013  . Nasal septal perforation 07/11/2014  . OSA (obstructive sleep apnea) 06/08/2013  . Physical deconditioning 04/24/2014  . Shortness of breath 06/23/2016  . Snoring 04/18/2013  . DDD (degenerative disc disease), cervical 11/19/2017  . MRSA colonization 03/03/2018  . Status post total shoulder arthroplasty, right 03/10/2018   Resolved Ambulatory Problems    Diagnosis Date Noted  . Diabetes type 2, controlled ([place]) 04/26/2009  . Diabetes mellitus without complication ([place]) 47/82/9562  . Chronic systolic CHF (congestive heart failure) ([place]) 04/26/2009  . ALLERGIC RHINITIS CAUSE UNSPECIFIED 04/30/2009  . VAGINAL DISCHARGE 07/14/2010  . RHUS DERMATITIS 01/22/2010  . CLOSED FRACTURE OF UNSPECIFIED PART OF RADIUS 09/26/2009  . FALL, HX OF 09/26/2009  . Obesity 04/23/2011  . Cellulitis and abscess of face 05/29/2011  . STING 05/29/2011  . Lymphangitis 07/14/2012  . COPD with acute exacerbation ([place]) 04/03/2013  . Closed nondisplaced fracture of distal phalanx of left great toe 04/21/2013  . Rash 05/10/2014  . Jaw pain 05/10/2014  . Fatty liver disease, nonalcoholic 13/03/6577  . Chronic congestive heart failure ([place]) 05/19/2017  . Cellulitis and abscess 06/08/2013   Past Medical History:  Diagnosis Date  . Alkaline phosphatase elevation   . Asthma   . Breast cancer ([place]) 2011  . Carpal tunnel syndrome   . Carpal tunnel syndrome   . CHF (congestive heart failure) ([place])   . Cirrhosis, non-alcoholic ([place])   . DDD (degenerative disc disease), lumbar   . Depression   . Enlarged heart   . GERD (gastroesophageal reflux disease)   . History of kidney stones   . HTN (hypertension)   .  IBS (irritable bowel syndrome)   . LBBB (left bundle branch block)   . Pneumonia   . Presence of permanent cardiac pacemaker   . Sleep apnea    .    Review of Systems See HPI.     Objective:   Physical Exam  Constitutional: She appears well-developed and well-nourished.  HENT:   Head: Normocephalic and atraumatic.  Right Ear: External ear normal.  Left Ear: External ear normal.  Oropharynx and tongue looks a little glossy but no white patches. Sore with cracking in the corner of left lip.   Cardiovascular: Normal rate and regular rhythm.  Skin:  Under axilla, inguinal area and under breast erythematous patches with satellite lesion with some central maceration.           Assessment & Plan:  Marland KitchenMarland KitchenDiagnoses and all orders for this visit:  Candidiasis -     fluconazole (DIFLUCAN) 150 MG tablet; Take 1 tablet (150 mg total) by mouth once for 1 dose. Repeat in 48 hours. -     nystatin cream (MYCOSTATIN); Apply 1 application topically 2 (two) times daily.   It would be reasonable to think such a strong abx could cause yeast in multiple locations. 2 tablets of diflucan sent to pharmacy with nystatin cream. Follow up if not improving or if symptoms worsening.

## 2018-07-05 NOTE — Progress Notes (Signed)
Remote pacemaker transmission.   

## 2018-07-05 NOTE — Patient Instructions (Signed)
Intertrigo Intertrigo is skin irritation (inflammation) that happens in warm, moist areas of the body. The irritation can cause a rash and make skin raw and itchy. The rash is usually pink or red. It happens mostly between folds of skin or where skin rubs together, such as:  Toes.  Armpits.  Groin.  Belly.  Breasts.  Buttocks.  This condition is not passed from person to person (is not contagious). Follow these instructions at home:  Keep the affected area clean and dry.  Do not scratch your skin.  Stay cool as much as possible. Use an air conditioner or fan, if you can.  Apply over-the-counter and prescription medicines only as told by your doctor.  If you were prescribed an antibiotic medicine, use it as told by your doctor. Do not stop using the antibiotic even if your condition starts to get better.  Keep all follow-up visits as told by your doctor. This is important. How is this prevented?  Stay at a healthy weight.  Keep your feet dry. This is very important if you have diabetes. Wear cotton or wool socks.  Take care of and protect the skin in your groin and butt area as told by your doctor.  Do not wear tight clothes. Wear clothes that: ? Are loose. ? Take away moisture from your body. ? Are made of cotton.  Wear a bra that gives good support, if needed.  Shower and dry yourself fully after being active.  Keep your blood sugar under control if you have diabetes. Contact a doctor if:  Your symptoms do not get better with treatment.  Your symptoms get worse or they spread.  You notice more redness and warmth.  You have a fever. This information is not intended to replace advice given to you by your health care provider. Make sure you discuss any questions you have with your health care provider. Document Released: 09/12/2010 Document Revised: 01/16/2016 Document Reviewed: 02/11/2015 Elsevier Interactive Patient Education  [employer].

## 2018-07-07 DIAGNOSIS — G4733 Obstructive sleep apnea (adult) (pediatric): Secondary | ICD-10-CM | POA: Diagnosis not present

## 2018-07-14 ENCOUNTER — Telehealth: Payer: Self-pay | Admitting: Family Medicine

## 2018-07-14 ENCOUNTER — Ambulatory Visit (INDEPENDENT_AMBULATORY_CARE_PROVIDER_SITE_OTHER): Payer: Medicare Other | Admitting: Family Medicine

## 2018-07-14 ENCOUNTER — Encounter: Payer: Self-pay | Admitting: Family Medicine

## 2018-07-14 ENCOUNTER — Other Ambulatory Visit: Payer: Self-pay | Admitting: Family Medicine

## 2018-07-14 VITALS — BP 138/62 | HR 76 | Ht 62.0 in | Wt 220.0 lb

## 2018-07-14 DIAGNOSIS — N3001 Acute cystitis with hematuria: Secondary | ICD-10-CM | POA: Diagnosis not present

## 2018-07-14 DIAGNOSIS — E118 Type 2 diabetes mellitus with unspecified complications: Secondary | ICD-10-CM

## 2018-07-14 DIAGNOSIS — R350 Frequency of micturition: Secondary | ICD-10-CM

## 2018-07-14 LAB — POCT URINALYSIS DIPSTICK
Bilirubin, UA: NEGATIVE
Glucose, UA: NEGATIVE
Ketones, UA: NEGATIVE
NITRITE UA: NEGATIVE
PROTEIN UA: POSITIVE — AB
Spec Grav, UA: >=1.03 — AB (ref 1.010–1.025)
Urobilinogen, UA: 0.2 E.U./dL
pH, UA: 5 (ref 5.0–8.0)

## 2018-07-14 MED ORDER — FLUCONAZOLE 150 MG PO TABS: 150.0000 mg | ORAL_TABLET | Freq: Once | ORAL | 0 refills | Status: AC

## 2018-07-14 MED ORDER — NITROFURANTOIN MONOHYD MACRO 100 MG PO CAPS: 100.0000 mg | ORAL_CAPSULE | Freq: Two times a day (BID) | ORAL | 0 refills | Status: DC

## 2018-07-14 NOTE — Telephone Encounter (Signed)
Pt called. She said that  after taking a round of antibiotics, she started urinating in the day every  25 minutes and at night every hour.  She wants to know what do we recommend.  Thanks

## 2018-07-14 NOTE — Progress Notes (Signed)
Subjective:    Patient ID: Tanya Hogan, female    DOB: December 12, 1950, 67 y.o.   MRN: [id number]  HPI 67 year old female is here today for urinary frequency.  She was originally treated with lens on [place] nasal lid and felt like she was getting a yeast infection.  She came to the office as was treated with fluconazole.  She was having some vaginal itching at the time.  But now she is having urinary frequency urgency and still continuing to have some itching.  Urinalysis was positive for leukocytes and protein and blood. No fever, chills or sweats.  No back pain.  Some pelvic pressure.     Review of Systems  BP 138/62   Pulse 76   Ht _0  (1.575 m)   Wt 220 lb (99.8 kg)   SpO2 98%   BMI 40.24 kg/m     Allergies  Allergen Reactions  . Penicillins Anaphylaxis    Has patient had a PCN reaction causing immediate rash, facial/tongue/throat swelling, SOB or lightheadedness with hypotension: Yes Has patient had a PCN reaction causing severe rash involving mucus membranes or skin necrosis: No Has patient had a PCN reaction that required hospitalization: No Has patient had a PCN reaction occurring within the last 10 years: No If all of the above answers are "NO", then may proceed with Cephalosporin use.   . Naproxen Swelling  . Accupril [Quinapril Hcl] Other (See Comments)    Hyperkalemia  . Aspirin Other (See Comments)    Pt states "burns holes in stomach", ulcers   . Atorvastatin Other (See Comments)    msucle aches  . Baking Soda-Fluoride [Sodium Fluoride] Itching  . Celebrex [Celecoxib]     ulcers  . Codeine Nausea And Vomiting  . Fluticasone Other (See Comments)    Nasal ulcer.    . Furosemide Other (See Comments)    Nose bleed  . Lactose Intolerance (Gi) Other (See Comments)    Increased calcium, sneezing, nasal issues  . Levocetirizine     Doesn't recall  . Losartan Potassium Itching  . Metformin And Related Diarrhea  . Nasonex [Mometasone Furoate] Other (See Comments)     nosebleed  . Rifampin Other (See Comments)    DOES NOT REMEMBER THIS MEDICATION OR ALLERGY  . Wellbutrin [Bupropion] Other (See Comments)    Muscle aches, pain, "made my skin crawl"  . Clindamycin/Lincomycin Rash  . Iodinated Diagnostic Agents Itching and Rash    Pt states she has had ultrasound with dye but was fine  . Lanolin-Petrolatum Rash  . Miralax [Polyethylene Glycol] Rash  . Quinine Rash  . Sulfa Antibiotics Rash  . Sulfonamide Derivatives Rash  . Tramadol Rash    Anxiety  . Wool Alcohol [Lanolin] Rash  . Zoledronic Acid Other (See Comments) and Rash    shaking    Past Medical History:  Diagnosis Date  . Alkaline phosphatase elevation   . Asthma   . Breast cancer ([place]) 2011  . Carpal tunnel syndrome   . Carpal tunnel syndrome    bilateral  . CHF (congestive heart failure) ([place])   . Cirrhosis, non-alcoholic ([place])   . Closed fracture of unspecified part of radius (alone)   . DDD (degenerative disc disease), lumbar   . Depression    pt denies  . Diabetes mellitus without complication ([place])   . Enlarged heart   . Fibromyalgia   . GERD (gastroesophageal reflux disease)   . History of kidney stones   . HTN (hypertension)   .  Hypothyroidism   . IBS (irritable bowel syndrome)   . LBBB (left bundle branch block)   . Microcytic anemia 05/14/2017  . Pneumonia   . Presence of permanent cardiac pacemaker   . Sleep apnea    uses cpap    Past Surgical History:  Procedure Laterality Date  . BREAST LUMPECTOMY Left 2011  . COLONOSCOPY    . FOOT SURGERY    . KNEE SURGERY    . PACEMAKER GENERATOR CHANGE N/A 08/23/2014   Procedure: PACEMAKER GENERATOR CHANGE;  Surgeon: Evans Lance, MD;  Location: [facility] CATH LAB;  Service: Cardiovascular;  Laterality: N/A;  . PACEMAKER PLACEMENT  2009   [facility]  . RECTAL SURGERY  1976  . TOTAL SHOULDER ARTHROPLASTY Right 03/10/2018  . TOTAL SHOULDER ARTHROPLASTY Right 03/10/2018   Procedure: RIGHT TOTAL SHOULDER  ARTHROPLASTY;  Surgeon: [name], MD;  Location: [facility];  Service: Orthopedics;  Laterality: Right;  . TUBAL LIGATION      Social History   Socioeconomic History  . Marital status: Divorced    Spouse name: Not on file  . Number of children: Not on file  . Years of education: Not on file  . Highest education level: Not on file  Occupational History  . Not on file  Social Needs  . Financial resource strain: Not on file  . Food insecurity:    Worry: Not on file    Inability: Not on file  . Transportation needs:    Medical: Not on file    Non-medical: Not on file  Tobacco Use  . Smoking status: Former Smoker    Last attempt to quit: 08/24/1966    Years since quitting: 51.9  . Smokeless tobacco: Never Used  Substance and Sexual Activity  . Alcohol use: No  . Drug use: No  . Sexual activity: Not Currently  Lifestyle  . Physical activity:    Days per week: Not on file    Minutes per session: Not on file  . Stress: Not on file  Relationships  . Social connections:    Talks on phone: Not on file    Gets together: Not on file    Attends religious service: Not on file    Active member of club or organization: Not on file    Attends meetings of clubs or organizations: Not on file    Relationship status: Not on file  . Intimate partner violence:    Fear of current or ex partner: Not on file    Emotionally abused: Not on file    Physically abused: Not on file    Forced sexual activity: Not on file  Other Topics Concern  . Not on file  Social History Narrative  . Not on file    Family History  Problem Relation Age of Onset  . Melanoma Mother   . Heart failure Unknown   . Breast cancer Unknown   . Hypertension Unknown     Outpatient Encounter Medications as of 07/14/2018  Medication Sig  . ACCU-CHEK FASTCLIX LANCETS MISC Test twice a day  . albuterol (PROVENTIL HFA;VENTOLIN HFA) 108 (90 Base) MCG/ACT inhaler Inhale 2 puffs into the lungs every 4 (four) hours as  needed for wheezing or shortness of breath.  [name] alendronate (FOSAMAX) 70 MG tablet Take 1 tablet (70 mg total) by mouth every 7 (seven) days. Take with a full glass of water on an empty stomach.  . AMBULATORY NON FORMULARY MEDICATION Medication Name: Needs tubing and suplies for her  nebulizer machine. Dx COPD. [facility]  . AMBULATORY NON FORMULARY MEDICATION Medication Name:[employer] Footwear. Dx: E11.8 DM type 2; M79.671,M79.672 Bilateral foot pain.  . BD PEN NEEDLE NANO U/F 32G X 4 MM MISC Test two times daily  . Blood Glucose Monitoring Suppl (ACCU-CHEK NANO SMARTVIEW) w/Device KIT To be used to test blood sugars two times daily. Dx:E11.8  . bumetanide (BUMEX) 1 MG tablet TAKE 1 TABLET BY MOUTH TWO  TIMES DAILY  . BYSTOLIC 10 MG tablet TAKE 1 TABLET BY MOUTH  DAILY  . Calcium Carb-Cholecalciferol (CALCIUM 500/D) 500-400 MG-UNIT CHEW Chew 600 mg by mouth 2 (two) times daily.  . clotrimazole-betamethasone (LOTRISONE) cream Apply 1 application topically 2 (two) times daily as needed.   . dicyclomine (BENTYL) 20 MG tablet Take 10-20 mg by mouth every 8 (eight) hours as needed for spasms (cramps).   [name] diltiazem (CARDIZEM) 120 MG tablet Take 1 tablet (120 mg total) by mouth daily.  [name] EPIPEN 2-PAK 0.3 MG/0.3ML SOAJ injection INJECT 1 PEN INTO MUSCLE OF LATERAL THIGH FOR SEVERE ALLERGIC REACTION. CALL 911 AFTER USING THIS MEDICATION.  [name] Fluticasone-Salmeterol (ADVAIR) 250-50 MCG/DOSE AEPB Inhale 1 puff into the lungs 2 (two) times daily.  [name] gabapentin (NEURONTIN) 100 MG capsule TAKE 1 TO 2 CAPSULES(100 TO 200 MG) BY MOUTH AT BEDTIME (Patient taking differently: Take 100 - 200 mg at bedtime as needed for pain)  . glucose blood (ACCU-CHEK SMARTVIEW) test strip FOR TESTING BLOOD SUGARS DAILY. DX: E11.8  . LANTUS SOLOSTAR 100 UNIT/ML Solostar Pen INJECT SUBCUTANEOUSLY 40  UNITS AT BEDTIME  . levalbuterol (XOPENEX) 1.25 MG/3ML nebulizer solution Take 1.25 mg by nebulization every 8 (eight) hours as  needed for wheezing or shortness of breath.   . levothyroxine (SYNTHROID, LEVOTHROID) 75 MCG tablet TAKE 1 TABLET BY MOUTH  DAILY  . Magnesium 500 MG TABS Take 500 mg by mouth daily as needed (spasms).  . methocarbamol (ROBAXIN) 500 MG tablet Take 1 tablet (500 mg total) by mouth 3 (three) times daily.  . Multiple Vitamin (MULTIVITAMIN WITH MINERALS) TABS tablet Take 1 tablet by mouth daily.  . Multiple Vitamins-Minerals (OCUVITE EYE + MULTI) TABS Take 1 tablet by mouth daily.  [name] nystatin cream (MYCOSTATIN) Apply 1 application topically 2 (two) times daily.  . pantoprazole (PROTONIX) 40 MG tablet TAKE 1 TABLET BY MOUTH  EVERY MORNING AS DIRECTED  . pravastatin (PRAVACHOL) 40 MG tablet Take 1 tablet (40 mg total) by mouth at bedtime.  [name] tiotropium (SPIRIVA) 18 MCG inhalation capsule Place 18 mcg into inhaler and inhale daily. Inhale the contents of 1 capsule via handhaler  . fluconazole (DIFLUCAN) 150 MG tablet Take 1 tablet (150 mg total) by mouth once for 1 dose.  . nitrofurantoin, macrocrystal-monohydrate, (MACROBID) 100 MG capsule Take 1 capsule (100 mg total) by mouth 2 (two) times daily.   No facility-administered encounter medications on file as of 07/14/2018.          Objective:   Physical Exam  Constitutional: She is oriented to person, place, and time. She appears well-developed and well-nourished.  HENT:  Head: Normocephalic and atraumatic.  Cardiovascular: Normal rate, regular rhythm and normal heart sounds.  Pulmonary/Chest: Effort normal and breath sounds normal.  Neurological: She is alert and oriented to person, place, and time.  Skin: Skin is warm and dry.  Psychiatric: She has a normal mood and affect. Her behavior is normal.          Assessment & Plan:  Urinary tract infection-treat with  nitrofurantoin based on allergies if not improving then please let us know.  I did go ahead and send over prescription for an extra Diflucan since she just completed a course of  Diflucan just to make sure that being on another antibiotic does not cause any recurrence.

## 2018-07-14 NOTE — Telephone Encounter (Signed)
Pt advised to come in to have urine checked. appt made.Tanya Hogan, [place]

## 2018-07-14 NOTE — Addendum Note (Signed)
Addended by: Teddy Spike on: 07/14/2018 04:24 PM   Modules accepted: Orders

## 2018-07-15 ENCOUNTER — Other Ambulatory Visit: Payer: Self-pay | Admitting: *Deleted

## 2018-07-15 MED ORDER — INSULIN PEN NEEDLE 32G X 4 MM MISC: 6 refills | Status: DC

## 2018-07-16 LAB — URINE CULTURE
MICRO NUMBER:: 91405375
SPECIMEN QUALITY:: ADEQUATE

## 2018-07-19 ENCOUNTER — Other Ambulatory Visit: Payer: Self-pay | Admitting: *Deleted

## 2018-07-19 DIAGNOSIS — E118 Type 2 diabetes mellitus with unspecified complications: Secondary | ICD-10-CM

## 2018-07-19 MED ORDER — INSULIN PEN NEEDLE 32G X 4 MM MISC: 6 refills | Status: DC

## 2018-07-28 ENCOUNTER — Other Ambulatory Visit: Payer: Self-pay | Admitting: Physician Assistant

## 2018-07-28 DIAGNOSIS — B379 Candidiasis, unspecified: Secondary | ICD-10-CM

## 2018-08-04 DIAGNOSIS — G4733 Obstructive sleep apnea (adult) (pediatric): Secondary | ICD-10-CM | POA: Diagnosis not present

## 2018-08-06 DIAGNOSIS — G4733 Obstructive sleep apnea (adult) (pediatric): Secondary | ICD-10-CM | POA: Diagnosis not present

## 2018-08-25 ENCOUNTER — Ambulatory Visit (INDEPENDENT_AMBULATORY_CARE_PROVIDER_SITE_OTHER): Payer: Medicare Other | Admitting: Family Medicine

## 2018-08-25 ENCOUNTER — Encounter: Payer: Self-pay | Admitting: Family Medicine

## 2018-08-25 VITALS — BP 134/50 | HR 76 | Ht 62.0 in | Wt 215.0 lb

## 2018-08-25 DIAGNOSIS — M542 Cervicalgia: Secondary | ICD-10-CM

## 2018-08-25 DIAGNOSIS — I5022 Chronic systolic (congestive) heart failure: Secondary | ICD-10-CM | POA: Diagnosis not present

## 2018-08-25 DIAGNOSIS — I10 Essential (primary) hypertension: Secondary | ICD-10-CM | POA: Diagnosis not present

## 2018-08-25 DIAGNOSIS — E118 Type 2 diabetes mellitus with unspecified complications: Secondary | ICD-10-CM

## 2018-08-25 LAB — POCT GLYCOSYLATED HEMOGLOBIN (HGB A1C): HEMOGLOBIN A1C: 6.6 % — AB (ref 4.0–5.6)

## 2018-08-25 LAB — POCT UA - MICROALBUMIN
CREATININE, POC: 50 mg/dL
Microalbumin Ur, POC: 10 mg/L

## 2018-08-25 NOTE — Progress Notes (Signed)
Subjective:    CC: BP and DM  HPI:  Hypertension- Pt denies chest pain, SOB, dizziness, or heart palpitations.  Taking meds as directed w/o problems.  Denies medication side effects.    Diabetes - no hypoglycemic events. No wounds or sores that are not healing well. No increased thirst or urination. Checking glucose at home. Taking medications as prescribed without any side effects.  He was actually getting some low blood sugars and so actually had decreased her Lantus down to about 20 units and then a couple times only used 10.  For about the last week and a half she is actually not been using it at all and her blood sugars have been running in the 130s to 140s. Lab Results  Component Value Date   HGBA1C 6.6 (A) 20/94/7096    F/U Systolic heart failure. -She denies any increase in swelling except for today.  She said she drank some salty bicarbonated water yesterday and feels like that has caused her to feel stiff and retaining some fluid today.  She also complains of increased neck and knee pain.  She is asking for cervical collar today.  She says she is used those in the past and they are usually really helpful when she starts to get a lot of pain in her neck.  She has some history of cervical osteoarthritis.   Past medical history, Surgical history, Family history not pertinant except as noted below, Social history, Allergies, and medications have been entered into the medical record, reviewed, and corrections made.   Review of Systems: No fevers, chills, night sweats, weight loss, chest pain, or shortness of breath.   Objective:    General: Well Developed, well nourished, and in no acute distress.  Neuro: Alert and oriented x3, extra-ocular muscles intact, sensation grossly intact.  HEENT: Normocephalic, atraumatic  Skin: Warm and dry, no rashes. Cardiac: Regular rate and rhythm, no murmurs rubs or gallops, trace ankle edema bilaterally,  extremity edema.  Respiratory: Clear to  auscultation bilaterally. Not using accessory muscles, speaking in full sentences. MSK: normal flexion and rotation right and left.    Impression and Recommendations:    HTN - Well controlled. Continue current regimen. Follow up in  4 months.    DM - restart Lantus to 10 units at bedtime nightly.  Optimally we want her blood sugars under 130 consistently.  No up in 3 months.  Systolic heart failure - stable.  No sign of volume overload.  She is doing well overall.  Cervical strain-handout for stretches given to do on her own at home.  Also provided her a cervical collar for support if she is not improving over the next couple weeks and I would like her for to return specifically for the neck pain.

## 2018-08-25 NOTE — Patient Instructions (Signed)
Restart Lantus 10 units at bedtime.

## 2018-08-31 ENCOUNTER — Other Ambulatory Visit: Payer: Self-pay | Admitting: Adult Health

## 2018-08-31 ENCOUNTER — Other Ambulatory Visit: Payer: Self-pay | Admitting: Family Medicine

## 2018-08-31 DIAGNOSIS — D509 Iron deficiency anemia, unspecified: Secondary | ICD-10-CM | POA: Diagnosis not present

## 2018-08-31 DIAGNOSIS — K7581 Nonalcoholic steatohepatitis (NASH): Secondary | ICD-10-CM | POA: Diagnosis not present

## 2018-08-31 DIAGNOSIS — D126 Benign neoplasm of colon, unspecified: Secondary | ICD-10-CM | POA: Diagnosis not present

## 2018-08-31 DIAGNOSIS — K740 Hepatic fibrosis: Secondary | ICD-10-CM | POA: Diagnosis not present

## 2018-08-31 DIAGNOSIS — Z1231 Encounter for screening mammogram for malignant neoplasm of breast: Secondary | ICD-10-CM

## 2018-09-01 ENCOUNTER — Ambulatory Visit (INDEPENDENT_AMBULATORY_CARE_PROVIDER_SITE_OTHER): Payer: Medicare Other | Admitting: Sports Medicine

## 2018-09-01 DIAGNOSIS — K0510 Chronic gingivitis, plaque induced: Secondary | ICD-10-CM | POA: Insufficient documentation

## 2018-09-01 DIAGNOSIS — M5030 Other cervical disc degeneration, unspecified cervical region: Secondary | ICD-10-CM

## 2018-09-01 MED ORDER — CEFUROXIME AXETIL 500 MG PO TABS: 500.0000 mg | ORAL_TABLET | Freq: Two times a day (BID) | ORAL | 0 refills | Status: DC

## 2018-09-01 NOTE — Assessment & Plan Note (Addendum)
There does also appear to be some mild gingivitis with a mucinoid cyst on the left maxillary region. Using Ceftin. Allergic to penicillins, allergy list is 27 medications long, meaning unlikely allergic to any of them. She will follow-up with her dentist for this.

## 2018-09-01 NOTE — Addendum Note (Signed)
Addended by: Silverio Decamp on: 09/01/2018 02:03 PM   Modules accepted: Orders

## 2018-09-01 NOTE — Assessment & Plan Note (Signed)
Persistent pain, at this point we are going to proceed with a cervical spine CT, she cannot have an MRI due to her pacemaker. Soft collar. This will be for epidural planning.

## 2018-09-01 NOTE — Progress Notes (Addendum)
Subjective:    CC: Neck pain  HPI: This is a pleasant 68 year old female, she has known cervical degenerative disc disease and facet arthritis.  Chronic intermittent pain, mostly at the base of the skull, worse with extension, and rotation of the neck.  Nothing down the arms, no radicular symptoms, no bowel or bladder dysfunction, saddle numbness, progressive weakness.  We have already treated her with physical therapy, oral medications, activity modification.  She also has a lesion in her mouth that she would like me to look at.  I reviewed the past medical history, family history, social history, surgical history, and allergies today and no changes were needed.  Please see the problem list section below in epic for further details.  Past Medical History: Past Medical History:  Diagnosis Date  . Alkaline phosphatase elevation   . Asthma   . Breast cancer ([place]) 2011  . Carpal tunnel syndrome   . Carpal tunnel syndrome    bilateral  . CHF (congestive heart failure) ([place])   . Cirrhosis, non-alcoholic ([place])   . Closed fracture of unspecified part of radius (alone)   . DDD (degenerative disc disease), lumbar   . Depression    pt denies  . Diabetes mellitus without complication ([place])   . Enlarged heart   . Fibromyalgia   . GERD (gastroesophageal reflux disease)   . History of kidney stones   . HTN (hypertension)   . Hypothyroidism   . IBS (irritable bowel syndrome)   . LBBB (left bundle branch block)   . Microcytic anemia 05/14/2017  . Pneumonia   . Presence of permanent cardiac pacemaker   . Sleep apnea    uses cpap   Past Surgical History: Past Surgical History:  Procedure Laterality Date  . BREAST LUMPECTOMY Left 2011  . COLONOSCOPY    . FOOT SURGERY    . KNEE SURGERY    . PACEMAKER GENERATOR CHANGE N/A 08/23/2014   Procedure: PACEMAKER GENERATOR CHANGE;  Surgeon: Evans Lance, MD;  [facility] C[facility] LAB;  Service: Cardiovascular;  Laterality: N/A;  . PACEMAKER  PLACEMENT  2009   [facility]  . RECTAL SURGERY  1976  . TOTAL SHOULDER ARTHROPLASTY Right 03/10/2018  . TOTAL SHOULDER ARTHROPLASTY Right 03/10/2018   Procedure: RIGHT TOTAL SHOULDER ARTHROPLASTY;  Surgeon: [name], MD;  Location: [place];  Service: Orthopedics;  Laterality: Right;  . TUBAL LIGATION     Social History: Social History   Socioeconomic History  . Marital status: Divorced    Spouse name: Not on file  . Number of children: Not on file  . Years of education: Not on file  . Highest education level: Not on file  Occupational History  . Not on file  Social Needs  . Financial resource strain: Not on file  . Food insecurity:    Worry: Not on file    Inability: Not on file  . Transportation needs:    Medical: Not on file    Non-medical: Not on file  Tobacco Use  . Smoking status: Former Smoker    Last attempt to quit: 08/24/1966    Years since quitting: 52.0  . Smokeless tobacco: Never Used  Substance and Sexual Activity  . Alcohol use: No  . Drug use: No  . Sexual activity: Not Currently  Lifestyle  . Physical activity:    Days per week: Not on file    Minutes per session: Not on file  . Stress: Not on file  Relationships  . Social  connections:    Talks on phone: Not on file    Gets together: Not on file    Attends religious service: Not on file    Active member of club or organization: Not on file    Attends meetings of clubs or organizations: Not on file    Relationship status: Not on file  Other Topics Concern  . Not on file  Social History Narrative  . Not on file   Family History: Family History  Problem Relation Age of Onset  . Melanoma Mother   . Heart failure Unknown   . Breast cancer Unknown   . Hypertension Unknown    Allergies: Allergies  Allergen Reactions  . Penicillins Anaphylaxis    Has patient had a PCN reaction causing immediate rash, facial/tongue/throat swelling, SOB or lightheadedness with hypotension: Yes Has  patient had a PCN reaction causing severe rash involving mucus membranes or skin necrosis: No Has patient had a PCN reaction that required hospitalization: No Has patient had a PCN reaction occurring within the last 10 years: No If all of the above answers are "NO", then may proceed with Cephalosporin use.   . Naproxen Swelling  . Accupril [Quinapril Hcl] Other (See Comments)    Hyperkalemia  . Aspirin Other (See Comments)    Pt states "burns holes in stomach", ulcers   . Atorvastatin Other (See Comments)    msucle aches  . Baking Soda-Fluoride [Sodium Fluoride] Itching  . Celebrex [Celecoxib]     ulcers  . Codeine Nausea And Vomiting  . Fluticasone Other (See Comments)    Nasal ulcer.    . Furosemide Other (See Comments)    Nose bleed  . Lactose Intolerance (Gi) Other (See Comments)    Increased calcium, sneezing, nasal issues  . Levocetirizine     Doesn't recall  . Losartan Potassium Itching  . Metformin And Related Diarrhea  . Nasonex [Mometasone Furoate] Other (See Comments)    nosebleed  . Rifampin Other (See Comments)    DOES NOT REMEMBER THIS MEDICATION OR ALLERGY  . Wellbutrin [Bupropion] Other (See Comments)    Muscle aches, pain, "made my skin crawl"  . Clindamycin/Lincomycin Rash  . Iodinated Diagnostic Agents Itching and Rash    Pt states she has had ultrasound with dye but was fine  . Lanolin-Petrolatum Rash  . Miralax [Polyethylene Glycol] Rash  . Quinine Rash  . Sulfa Antibiotics Rash  . Sulfonamide Derivatives Rash  . Tramadol Rash    Anxiety  . Wool Alcohol [Lanolin] Rash  . Zoledronic Acid Other (See Comments) and Rash    shaking   Medications: See med rec.  Review of Systems: No fevers, chills, night sweats, weight loss, chest pain, or shortness of breath.   Objective:    General: Well Developed, well nourished, and in no acute distress.  Neuro: Alert and oriented x3, extra-ocular muscles intact, sensation grossly intact.  HEENT: Normocephalic,  atraumatic, pupils equal round reactive to light, neck supple, no masses, no lymphadenopathy, thyroid nonpalpable.  There is a cystic lesion on the left upper gums at the level of the incisors, appears to be a mucinoid cyst.  Gums are somewhat reddish and swollen. Skin: Warm and dry, no rashes. Cardiac: Regular rate and rhythm, no murmurs rubs or gallops, no lower extremity edema.  Respiratory: Clear to auscultation bilaterally. Not using accessory muscles, speaking in full sentences. Neck: Negative spurling's Range of motion is decreased in all directions. Grip strength and sensation normal in bilateral hands Strength good  C4 to T1 distribution No sensory change to C4 to T1 Reflexes normal Tender to palpation in the paramedian musculature.  Spine x-rays personally reviewed, there is multilevel cervical degenerative disc disease and straightening of the normal cervical lordosis consistent with spasm  Impression and Recommendations:    DDD (degenerative disc disease), cervical Persistent pain, at this point we are going to proceed with a cervical spine CT, she cannot have an MRI due to her pacemaker. Soft collar. This will be for epidural planning.  Gingivitis There does also appear to be some mild gingivitis with a mucinoid cyst on the left maxillary region. Using Ceftin. Allergic to penicillins, allergy list is 27 medications long, meaning unlikely allergic to any of them. She will follow-up with her dentist for this. ___________________________________________ [name]. [name], M.D., ABFM., CAQSM. Primary Care and Sports Medicine [facility]  Adjunct Professor of [employer]

## 2018-09-03 LAB — CUP PACEART REMOTE DEVICE CHECK
Battery Voltage: 2.98 V
Brady Statistic AP VS Percent: 0.03 %
Brady Statistic AS VP Percent: 34.52 %
Brady Statistic AS VS Percent: 0.25 %
Brady Statistic RA Percent Paced: 64.42 %
Date Time Interrogation Session: 20191111131419
Implantable Lead Implant Date: 20030324
Implantable Lead Implant Date: 20030324
Implantable Lead Implant Date: 20030324
Implantable Lead Location: 753858
Implantable Lead Location: 753859
Implantable Lead Location: 753860
Implantable Lead Model: 4068
Implantable Lead Model: 4068
Implantable Lead Model: 4193
Implantable Pulse Generator Implant Date: 20151231
Lead Channel Impedance Value: 1026 Ohm
Lead Channel Impedance Value: 1197 Ohm
Lead Channel Impedance Value: 4047 Ohm
Lead Channel Impedance Value: 4047 Ohm
Lead Channel Impedance Value: 4047 Ohm
Lead Channel Impedance Value: 418 Ohm
Lead Channel Impedance Value: 418 Ohm
Lead Channel Impedance Value: 437 Ohm
Lead Channel Impedance Value: 570 Ohm
Lead Channel Pacing Threshold Amplitude: 1 V
Lead Channel Pacing Threshold Amplitude: 2 V
Lead Channel Sensing Intrinsic Amplitude: 1.25 mV
Lead Channel Sensing Intrinsic Amplitude: 1.25 mV
Lead Channel Sensing Intrinsic Amplitude: 5.875 mV
Lead Channel Sensing Intrinsic Amplitude: 5.875 mV
Lead Channel Setting Pacing Amplitude: 1.5 V
Lead Channel Setting Pacing Amplitude: 2.5 V
Lead Channel Setting Pacing Amplitude: 3.5 V
Lead Channel Setting Pacing Pulse Width: 0.8 ms
Lead Channel Setting Sensing Sensitivity: 4 mV
MDC IDC MSMT BATTERY REMAINING LONGEVITY: 42 mo
MDC IDC MSMT LEADCHNL RA PACING THRESHOLD PULSEWIDTH: 0.4 ms
MDC IDC MSMT LEADCHNL RV PACING THRESHOLD PULSEWIDTH: 0.4 ms
MDC IDC SET LEADCHNL RV PACING PULSEWIDTH: 0.8 ms
MDC IDC STAT BRADY AP VP PERCENT: 65.2 %
MDC IDC STAT BRADY RV PERCENT PACED: 98.77 %

## 2018-09-06 DIAGNOSIS — G4733 Obstructive sleep apnea (adult) (pediatric): Secondary | ICD-10-CM | POA: Diagnosis not present

## 2018-09-07 DIAGNOSIS — Z471 Aftercare following joint replacement surgery: Secondary | ICD-10-CM | POA: Diagnosis not present

## 2018-09-07 DIAGNOSIS — M25511 Pain in right shoulder: Secondary | ICD-10-CM | POA: Diagnosis not present

## 2018-09-07 DIAGNOSIS — Z96611 Presence of right artificial shoulder joint: Secondary | ICD-10-CM | POA: Diagnosis not present

## 2018-09-08 ENCOUNTER — Ambulatory Visit (INDEPENDENT_AMBULATORY_CARE_PROVIDER_SITE_OTHER): Payer: Medicare Other

## 2018-09-08 DIAGNOSIS — Z1231 Encounter for screening mammogram for malignant neoplasm of breast: Secondary | ICD-10-CM

## 2018-09-08 DIAGNOSIS — M50322 Other cervical disc degeneration at C5-C6 level: Secondary | ICD-10-CM | POA: Diagnosis not present

## 2018-09-08 DIAGNOSIS — M5030 Other cervical disc degeneration, unspecified cervical region: Secondary | ICD-10-CM

## 2018-09-08 DIAGNOSIS — M50222 Other cervical disc displacement at C5-C6 level: Secondary | ICD-10-CM | POA: Diagnosis not present

## 2018-09-09 ENCOUNTER — Other Ambulatory Visit: Payer: Self-pay | Admitting: *Deleted

## 2018-09-09 DIAGNOSIS — E118 Type 2 diabetes mellitus with unspecified complications: Secondary | ICD-10-CM

## 2018-09-09 MED ORDER — ACCU-CHEK FASTCLIX LANCETS MISC: 11 refills | Status: DC

## 2018-09-09 MED ORDER — ACCU-CHEK AVIVA PLUS W/DEVICE KIT: PACK | 0 refills | Status: DC

## 2018-09-09 MED ORDER — GLUCOSE BLOOD VI STRP: ORAL_STRIP | 4 refills | Status: DC

## 2018-09-15 ENCOUNTER — Encounter: Payer: Self-pay | Admitting: Internal Medicine

## 2018-09-22 ENCOUNTER — Telehealth: Payer: Self-pay | Admitting: Sports Medicine

## 2018-09-22 DIAGNOSIS — M5030 Other cervical disc degeneration, unspecified cervical region: Secondary | ICD-10-CM

## 2018-09-22 NOTE — Telephone Encounter (Signed)
Patient called and is wanting to schedule her neck injection that her Dr.T last discussed.

## 2018-09-22 NOTE — Telephone Encounter (Signed)
Msg left with pt's MRN for Tanya Hogan at [facility] imaging to call and schedule

## 2018-09-22 NOTE — Telephone Encounter (Signed)
Ordered, left-sided, please call [facility] imaging for scheduling.  If this fails we will proceed with multilevel facet joint injections.

## 2018-09-23 ENCOUNTER — Telehealth: Payer: Self-pay

## 2018-09-23 DIAGNOSIS — R0989 Other specified symptoms and signs involving the circulatory and respiratory systems: Secondary | ICD-10-CM

## 2018-09-23 NOTE — Telephone Encounter (Signed)
[facility] nurse went out and did a PAD screening for patient. Right leg was moderate risk for PAD.

## 2018-09-26 DIAGNOSIS — G4733 Obstructive sleep apnea (adult) (pediatric): Secondary | ICD-10-CM | POA: Diagnosis not present

## 2018-09-26 NOTE — Telephone Encounter (Signed)
A, typically what we do is we order more formal ABIs to evaluate.  Sometimes the screening test that they do is incorrect.  If she is okay with moving forward with formal ABIs then we can order those.  Typically we schedule those in [place] but if she would prefer [place] then please let us know and we can schedule through [place].

## 2018-09-26 NOTE — Telephone Encounter (Signed)
Tanya Hogan agreed to have ABI's in [place].

## 2018-09-28 ENCOUNTER — Other Ambulatory Visit: Payer: Medicare Other

## 2018-10-03 ENCOUNTER — Ambulatory Visit (INDEPENDENT_AMBULATORY_CARE_PROVIDER_SITE_OTHER): Payer: Medicare Other

## 2018-10-03 DIAGNOSIS — I5022 Chronic systolic (congestive) heart failure: Secondary | ICD-10-CM

## 2018-10-04 ENCOUNTER — Encounter: Payer: Self-pay | Admitting: Internal Medicine

## 2018-10-04 ENCOUNTER — Ambulatory Visit (INDEPENDENT_AMBULATORY_CARE_PROVIDER_SITE_OTHER): Payer: Medicare Other | Admitting: Internal Medicine

## 2018-10-04 VITALS — BP 122/64 | HR 77 | Ht 62.0 in | Wt 218.0 lb

## 2018-10-04 DIAGNOSIS — I5022 Chronic systolic (congestive) heart failure: Secondary | ICD-10-CM

## 2018-10-04 DIAGNOSIS — I442 Atrioventricular block, complete: Secondary | ICD-10-CM | POA: Diagnosis not present

## 2018-10-04 DIAGNOSIS — Z950 Presence of cardiac pacemaker: Secondary | ICD-10-CM | POA: Diagnosis not present

## 2018-10-04 DIAGNOSIS — I10 Essential (primary) hypertension: Secondary | ICD-10-CM

## 2018-10-04 LAB — CUP PACEART REMOTE DEVICE CHECK
Battery Remaining Longevity: 39 mo
Battery Voltage: 2.97 V
Brady Statistic AP VP Percent: 55.02 %
Brady Statistic AP VS Percent: 0.09 %
Brady Statistic AS VP Percent: 43.71 %
Brady Statistic AS VS Percent: 1.17 %
Brady Statistic RV Percent Paced: 96.79 %
Date Time Interrogation Session: 20200210105254
Implantable Lead Implant Date: 20030324
Implantable Lead Implant Date: 20030324
Implantable Lead Implant Date: 20030324
Implantable Lead Location: 753858
Implantable Lead Location: 753859
Implantable Lead Location: 753860
Implantable Lead Model: 4068
Implantable Lead Model: 4068
Implantable Pulse Generator Implant Date: 20151231
Lead Channel Impedance Value: 1102 Ohm
Lead Channel Impedance Value: 399 Ohm
Lead Channel Impedance Value: 4047 Ohm
Lead Channel Impedance Value: 4047 Ohm
Lead Channel Impedance Value: 418 Ohm
Lead Channel Impedance Value: 437 Ohm
Lead Channel Impedance Value: 551 Ohm
Lead Channel Impedance Value: 931 Ohm
Lead Channel Pacing Threshold Amplitude: 0.875 V
Lead Channel Pacing Threshold Pulse Width: 0.4 ms
Lead Channel Pacing Threshold Pulse Width: 0.4 ms
Lead Channel Sensing Intrinsic Amplitude: 1.125 mV
Lead Channel Sensing Intrinsic Amplitude: 1.125 mV
Lead Channel Sensing Intrinsic Amplitude: 7.75 mV
Lead Channel Sensing Intrinsic Amplitude: 7.75 mV
Lead Channel Setting Pacing Amplitude: 1.5 V
Lead Channel Setting Pacing Amplitude: 2.5 V
Lead Channel Setting Pacing Amplitude: 3.5 V
Lead Channel Setting Pacing Pulse Width: 0.8 ms
Lead Channel Setting Sensing Sensitivity: 4 mV
MDC IDC MSMT LEADCHNL LV IMPEDANCE VALUE: 4047 Ohm
MDC IDC MSMT LEADCHNL RV PACING THRESHOLD AMPLITUDE: 1.875 V
MDC IDC SET LEADCHNL LV PACING PULSEWIDTH: 0.8 ms
MDC IDC STAT BRADY RA PERCENT PACED: 53.64 %

## 2018-10-04 NOTE — Patient Instructions (Signed)
Medication Instructions:  Your physician recommends that you continue on your current medications as directed. Please refer to the Current Medication list given to you today.  Labwork: None ordered.  Testing/Procedures: None ordered.  Follow-Up: Your physician wants you to follow-up in: one year with Dr. Lovena Le.   You will receive a reminder letter in the mail two months in advance. If you don't receive a letter, please call our office to schedule the follow-up appointment.  Remote monitoring is used to monitor your Pacemaker from home. This monitoring reduces the number of office visits required to check your device to one time per year. It allows [place] to keep an eye on the functioning of your device to ensure it is working properly. You are scheduled for a device check from home on 01/02/2019. You may send your transmission at any time that day. If you have a wireless device, the transmission will be sent automatically. After your physician reviews your transmission, you will receive a postcard with your next transmission date.  Any Other Special Instructions Will Be Listed Below (If Applicable).  If you need a refill on your cardiac medications before your next appointment, please call your pharmacy.

## 2018-10-04 NOTE — Progress Notes (Signed)
HPI Tanya Hogan returns today for followup of her BiV PPM. She is a morbidly obese 68 yo woman with a h/o chronic systolic heart failure, CHB, s/p BiV PPM insertion. She had a h/o fibromylagia but has improved recently. She denies peripheral edema.  She has class 2 CHF. She admits to some dietary indiscretion. She has lost 8 lbs.  Allergies  Allergen Reactions  . Penicillins Anaphylaxis    Has patient had a PCN reaction causing immediate rash, facial/tongue/throat swelling, SOB or lightheadedness with hypotension: Yes Has patient had a PCN reaction causing severe rash involving mucus membranes or skin necrosis: No Has patient had a PCN reaction that required hospitalization: No Has patient had a PCN reaction occurring within the last 10 years: No If all of the above answers are "NO", then may proceed with Cephalosporin use.   . Naproxen Swelling  . Accupril [Quinapril Hcl] Other (See Comments)    Hyperkalemia  . Aspirin Other (See Comments)    Pt states "burns holes in stomach", ulcers   . Atorvastatin Other (See Comments)    msucle aches  . Baking Soda-Fluoride [Sodium Fluoride] Itching  . Celebrex [Celecoxib]     ulcers  . Codeine Nausea And Vomiting  . Fluticasone Other (See Comments)    Nasal ulcer.    . Furosemide Other (See Comments)    Nose bleed  . Lactose Intolerance (Gi) Other (See Comments)    Increased calcium, sneezing, nasal issues  . Levocetirizine     Doesn't recall  . Losartan Potassium Itching  . Metformin And Related Diarrhea  . Nasonex [Mometasone Furoate] Other (See Comments)    nosebleed  . Rifampin Other (See Comments)    DOES NOT REMEMBER THIS MEDICATION OR ALLERGY  . Wellbutrin [Bupropion] Other (See Comments)    Muscle aches, pain, "made my skin crawl"  . Clindamycin/Lincomycin Rash  . Iodinated Diagnostic Agents Itching and Rash    Pt states she has had ultrasound with dye but was fine  . Lanolin-Petrolatum Rash  . Miralax [Polyethylene  Glycol] Rash  . Quinine Rash  . Sulfa Antibiotics Rash  . Sulfonamide Derivatives Rash  . Tramadol Rash    Anxiety  . Wool Alcohol [Lanolin] Rash  . Zoledronic Acid Other (See Comments) and Rash    shaking     Current Outpatient Medications  Medication Sig Dispense Refill  . ACCU-CHEK FASTCLIX LANCETS MISC Test twice a day. Dx:E11.8 204 each 11  . albuterol (PROVENTIL HFA;VENTOLIN HFA) 108 (90 Base) MCG/ACT inhaler Inhale 2 puffs into the lungs every 4 (four) hours as needed for wheezing or shortness of breath.    [name] alendronate (FOSAMAX) 70 MG tablet Take 1 tablet (70 mg total) by mouth every 7 (seven) days. Take with a full glass of water on an empty stomach. 12 tablet 4  . Blood Glucose Monitoring Suppl (ACCU-CHEK AVIVA PLUS) w/Device KIT For testing blood sugars twice daily. Dx: E11.8 1 kit 0  . bumetanide (BUMEX) 1 MG tablet TAKE 1 TABLET BY MOUTH TWO  TIMES DAILY 180 tablet 1  . BYSTOLIC 10 MG tablet TAKE 1 TABLET BY MOUTH  DAILY 90 tablet 1  . Calcium Carb-Cholecalciferol (CALCIUM 500/D) 500-400 MG-UNIT CHEW Chew 600 mg by mouth 2 (two) times daily.    . clotrimazole-betamethasone (LOTRISONE) cream Apply 1 application topically 2 (two) times daily as needed.     . dicyclomine (BENTYL) 20 MG tablet Take 10-20 mg by mouth every 8 (eight) hours as needed  for spasms (cramps).   11  . diltiazem (CARDIZEM) 120 MG tablet Take 1 tablet (120 mg total) by mouth daily. 90 tablet 1  . EPIPEN 2-PAK 0.3 MG/0.3ML SOAJ injection INJECT 1 PEN INTO MUSCLE OF LATERAL THIGH FOR SEVERE ALLERGIC REACTION. CALL 911 AFTER USING THIS MEDICATION.  2  . Fluticasone-Salmeterol (ADVAIR) 250-50 MCG/DOSE AEPB Inhale 1 puff into the lungs 2 (two) times daily.    [name] gabapentin (NEURONTIN) 100 MG capsule TAKE 1 TO 2 CAPSULES(100 TO 200 MG) BY MOUTH AT BEDTIME (Patient taking differently: Take 100 - 200 mg at bedtime as needed for pain) 60 capsule 3  . glucose blood (ACCU-CHEK AVIVA PLUS) test strip For testing blood  sugars twice daily. Dx: E11.8 200 each 4  . Insulin Pen Needle (BD PEN NEEDLE NANO U/F) 32G X 4 MM MISC Use once daily as directed 200 each 6  . LANTUS SOLOSTAR 100 UNIT/ML Solostar Pen INJECT SUBCUTANEOUSLY 40  UNITS AT BEDTIME 45 mL 4  . levalbuterol (XOPENEX) 1.25 MG/3ML nebulizer solution Take 1.25 mg by nebulization every 8 (eight) hours as needed for wheezing or shortness of breath.   2  . levothyroxine (SYNTHROID, LEVOTHROID) 75 MCG tablet TAKE 1 TABLET BY MOUTH  DAILY 90 tablet 1  . Magnesium 500 MG TABS Take 500 mg by mouth daily as needed (spasms).    . methocarbamol (ROBAXIN) 500 MG tablet Take 1 tablet (500 mg total) by mouth 3 (three) times daily. 30 tablet 0  . Multiple Vitamin (MULTIVITAMIN WITH MINERALS) TABS tablet Take 1 tablet by mouth daily.    . Multiple Vitamins-Minerals (OCUVITE EYE + MULTI) TABS Take 1 tablet by mouth daily.    [name] nystatin cream (MYCOSTATIN) Apply 1 application topically 2 (two) times daily. 60 g 1  . Probiotic Product (DIGESTIVE ADVANTAGE PO) Take by mouth.    . RESTASIS 0.05 % ophthalmic emulsion     . tiotropium (SPIRIVA) 18 MCG inhalation capsule Place 18 mcg into inhaler and inhale daily. Inhale the contents of 1 capsule via handhaler     No current facility-administered medications for this visit.      Past Medical History:  Diagnosis Date  . Alkaline phosphatase elevation   . Asthma   . Breast cancer ([place]) 2011  . Carpal tunnel syndrome   . Carpal tunnel syndrome    bilateral  . CHF (congestive heart failure) ([place])   . Cirrhosis, non-alcoholic ([place])   . Closed fracture of unspecified part of radius (alone)   . DDD (degenerative disc disease), lumbar   . Depression    pt denies  . Diabetes mellitus without complication ([place])   . Enlarged heart   . Fibromyalgia   . GERD (gastroesophageal reflux disease)   . History of kidney stones   . HTN (hypertension)   . Hypothyroidism   . IBS (irritable bowel syndrome)   . LBBB (left bundle  branch block)   . Microcytic anemia 05/14/2017  . Pneumonia   . Presence of permanent cardiac pacemaker   . Sleep apnea    uses cpap    ROS:   All systems reviewed and negative except as noted in the HPI.   Past Surgical History:  Procedure Laterality Date  . BREAST LUMPECTOMY Left 2011  . COLONOSCOPY    . FOOT SURGERY    . KNEE SURGERY    . PACEMAKER GENERATOR CHANGE N/A 08/23/2014   Procedure: PACEMAKER GENERATOR CHANGE;  Surgeon: Evans Lance, MD;  Location: [facility] CATH LAB;  Service: Cardiovascular;  Laterality: N/A;  . PACEMAKER PLACEMENT  2009   [facility]  . RECTAL SURGERY  1976  . TOTAL SHOULDER ARTHROPLASTY Right 03/10/2018  . TOTAL SHOULDER ARTHROPLASTY Right 03/10/2018   Procedure: RIGHT TOTAL SHOULDER ARTHROPLASTY;  Surgeon: [name], MD;  Location: [place];  Service: Orthopedics;  Laterality: Right;  . TUBAL LIGATION       Family History  Problem Relation Age of Onset  . Melanoma Mother   . Heart failure Other   . Breast cancer Other   . Hypertension Other      Social History   Socioeconomic History  . Marital status: Divorced    Spouse name: Not on file  . Number of children: Not on file  . Years of education: Not on file  . Highest education level: Not on file  Occupational History  . Not on file  Social Needs  . Financial resource strain: Not on file  . Food insecurity:    Worry: Not on file    Inability: Not on file  . Transportation needs:    Medical: Not on file    Non-medical: Not on file  Tobacco Use  . Smoking status: Former Smoker    Last attempt to quit: 08/24/1966    Years since quitting: 52.1  . Smokeless tobacco: Never Used  Substance and Sexual Activity  . Alcohol use: No  . Drug use: No  . Sexual activity: Not Currently  Lifestyle  . Physical activity:    Days per week: Not on file    Minutes per session: Not on file  . Stress: Not on file  Relationships  . Social connections:    Talks on phone: Not on  file    Gets together: Not on file    Attends religious service: Not on file    Active member of club or organization: Not on file    Attends meetings of clubs or organizations: Not on file    Relationship status: Not on file  . Intimate partner violence:    Fear of current or ex partner: Not on file    Emotionally abused: Not on file    Physically abused: Not on file    Forced sexual activity: Not on file  Other Topics Concern  . Not on file  Social History Narrative  . Not on file     BP 122/64   Pulse 77   Ht 5' 2"  (1.575 m)   Wt 218 lb (98.9 kg)   BMI 39.87 kg/m   Physical Exam:  Well appearing NAD HEENT: Unremarkable Neck:  No JVD, no thyromegally Lymphatics:  No adenopathy Back:  No CVA tenderness Lungs:  Clear with no wheezes HEART:  Regular rate rhythm, no murmurs, no rubs, no clicks Abd:  soft, positive bowel sounds, no organomegally, no rebound, no guarding Ext:  2 plus pulses, no edema, no cyanosis, no clubbing Skin:  No rashes no nodules Neuro:  CN II through XII intact, motor grossly intact  EKG - nsr with ventricular pacing  DEVICE  Normal device function.  See PaceArt for details.   Assess/Plan: 1. Chronic systolic heart failure - her symptoms are a bit better today. I have encouraged her to maintain a low sodium diet and to continue her current meds. 2. Obesity - she has lost 8 lbs. I encouraged her to get in the pool and continue her weight loss program. 3. Biv PPM - her device is working normally. We will recheck in  several months. 4. HTN - her blood pressure is reasonably well controlled. We will continue her current meds. I asked her to maintain a low sodium diet.  Mikle Bosworth.D.

## 2018-10-05 ENCOUNTER — Ambulatory Visit: Payer: Medicare Other

## 2018-10-05 NOTE — Progress Notes (Signed)
Subjective:   Tanya Hogan is a 68 y.o. female who presents for Medicare Annual (Subsequent) preventive examination.  Review of Systems:  No ROS.  Medicare Wellness Visit. Additional risk factors are reflected in the social history.  Cardiac Risk Factors include: advanced age (>30mn, >>59women);diabetes mellitus;dyslipidemia;hypertension;obesity (BMI >30kg/m2) Sleep patterns: Getting around 4-6 hours of sleep at night. Wears Cpap. Wakes upmat least 3 times a week. Wakies in the morning feeling refreshed Home Safety/Smoke Alarms: Feels safe in home. Smoke alarms in place.  Living environment; Lives with brother in a one story home. Ramp in place instead of steps. Shower is  [place] Wears seat belt.   Female:   Pap-  Aged out     Mammo- utd      Dexa scan-  utd      CCS- utd    Objective:     Vitals: BP (!) 142/70 (BP Location: Left Arm, Patient Position: Sitting, Cuff Size: Normal)   Pulse 72   Ht _0  (1.575 m)   SpO2 98%   BMI 39.87 kg/m   Body mass index is 39.87 kg/m.  Advanced Directives 10/10/2018 06/13/2018 03/10/2018 03/10/2018 10/05/2017 10/30/2014 08/23/2014  Does Patient Have a Medical Advance Directive? _1  No No  Would patient like information on creating a medical advance directive? No - Patient declined No - Patient declined No - Patient declined No - Patient declined No - Patient declined No - patient declined information No - patient declined information    Tobacco Social History   Tobacco Use  Smoking Status Former Smoker  . Packs/day: 0.00  . Years: 0.00  . Pack years: 0.00  . Last attempt to quit: 08/24/1966  . Years since quitting: 52.1  Smokeless Tobacco Never Used     Counseling given: Not Answered   Clinical Intake:                       Past Medical History:  Diagnosis Date  . Alkaline phosphatase elevation   . Asthma   . Breast cancer ([place]  . Carpal tunnel syndrome   . Carpal  tunnel syndrome    bilateral  . CHF (congestive heart failure) ([facility]   . [place]-alcoholic ([place]   . Closed fracture of unspecified part of radius (alone)   . DDD (degenerative disc disease), lumbar   . Depression    pt denies  . Diabetes mellitus without complication ([place]   . Enlarged heart   . Fibromyalgia   . GERD (gastroesophageal reflux disease)   . History of kidney stones   . HTN (hypertension)   . Hypothyroidism   . IBS (irritable bowel syndrome)   . LBBB (left bundle branch block)   . Microcytic anemia 05/14/2017  . Pneumonia   . Presence of permanent cardiac pacemaker   . PVD (peripheral vascular disease) ([facility]    2019  . Sleep apnea    uses cpap   Past Surgical History:  Procedure Laterality Date  . BREAST LUMPECTOMY Left 2011  . COLONOSCOPY    . FOOT SURGERY    . KNEE SURGERY    . PACEMAKER GENERATOR CHANGE N/A 08/23/2014   Procedure: PACEMAKER GENERATOR CHANGE;  Surgeon: GEvans Lance MD;  [facility];  Service: Cardiovascular;  Laterality: N/A;  . [facility]ER [facility]  [facility]  . RECTAL SURGERY  1976  . TOTAL SHOULDER ARTHROPLASTY Right 03/10/2018  .  TOTAL SHOULDER ARTHROPLASTY Right 03/10/2018   Procedure: RIGHT TOTAL SHOULDER ARTHROPLASTY;  Surgeon: [name], MD;  Location: [facility];  Service: Orthopedics;  Laterality: Right;  . TUBAL LIGATION     Family History  Problem Relation Age of Onset  . Melanoma Mother   . Heart failure Other   . Breast cancer Other   . Hypertension Other    Social History   Socioeconomic History  . Marital status: Divorced    Spouse name: Not on file  . Number of children: 2  . Years of education: 85  . Highest education level: Associate degree: academic program  Occupational History  . 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 Financial resource strain: Not hard at all  . Food insecurity:    Worry: Never true    Inability:  Never true  . Transportation needs:    Medical: No    Non-medical: No  Tobacco Use  . Smoking status: Former Smoker    Packs/day: 0.00    Years: 0.00    Pack years: 0.00    Last attempt to quit: 08/24/1966    Years since quitting: 52.1  . Smokeless tobacco: Never Used  Substance and Sexual Activity  . Alcohol use: No  . Drug use: No  . Sexual activity: Not Currently  Lifestyle  . Physical activity:    Days per week: 0 days    Minutes per session: Not on file  . Stress: Not at all  Relationships  . Social connections:    Talks on phone: More than three times a week    Gets together: Never    Attends religious service: Never    Active member of club or organization: No    Attends meetings of clubs or organizations: Never    Relationship status: Divorced  Other Topics Concern  . Not on file  Social History Narrative   Retired. 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 Likes Systems developer and music. Loves animals.    Outpatient Encounter Medications as of 10/10/2018  Medication Sig  . ACCU-CHEK FASTCLIX LANCETS MISC Test twice a day. Dx:E11.8  . alendronate (FOSAMAX) 70 MG tablet Take 1 tablet (70 mg total) by mouth every 7 (seven) days. Take with a full glass of water on an empty stomach.  . Blood Glucose Monitoring Suppl (ACCU-CHEK AVIVA PLUS) w/Device KIT For testing blood sugars twice daily. Dx: E11.8  . bumetanide (BUMEX) 1 MG tablet TAKE 1 TABLET BY MOUTH TWO  TIMES DAILY  . BYSTOLIC 10 MG tablet TAKE 1 TABLET BY MOUTH  DAILY  . Calcium Carb-Cholecalciferol (CALCIUM 500/D) 500-400 MG-UNIT CHEW Chew 600 mg by mouth 2 (two) times daily.  [name] diltiazem (CARDIZEM) 120 MG tablet Take 1 tablet (120 mg total) by mouth daily.  . Fluticasone-Salmeterol (ADVAIR) 250-50 MCG/DOSE AEPB Inhale 1 puff into the lungs 2 (two) times daily.  [name] glucose blood (ACCU-CHEK AVIVA PLUS) test strip For testing blood sugars twice daily. Dx: E11.8  . Insulin Pen Needle (BD PEN NEEDLE NANO U/F) 32G X 4  MM MISC Use once daily as directed  . LANTUS SOLOSTAR 100 UNIT/ML Solostar Pen INJECT SUBCUTANEOUSLY 40  UNITS AT BEDTIME  . levalbuterol (XOPENEX) 1.25 MG/3ML nebulizer solution Take 1.25 mg by nebulization every 8 (eight) hours as needed for wheezing or shortness of breath.   . levothyroxine (SYNTHROID, LEVOTHROID) 75 MCG tablet TAKE 1 TABLET BY MOUTH  DAILY  . Multiple Vitamin (MULTIVITAMIN WITH  MINERALS) TABS tablet Take 1 tablet by mouth daily.  . Multiple Vitamins-Minerals (OCUVITE EYE + MULTI) TABS Take 1 tablet by mouth daily.  . RESTASIS 0.05 % ophthalmic emulsion   . tiotropium (SPIRIVA) 18 MCG inhalation capsule Place 18 mcg into inhaler and inhale daily. Inhale the contents of 1 capsule via handhaler  . albuterol (PROVENTIL HFA;VENTOLIN HFA) 108 (90 Base) MCG/ACT inhaler Inhale 2 puffs into the lungs every 4 (four) hours as needed for wheezing or shortness of breath.  . clotrimazole-betamethasone (LOTRISONE) cream Apply 1 application topically 2 (two) times daily as needed.   . dicyclomine (BENTYL) 20 MG tablet Take 10-20 mg by mouth every 8 (eight) hours as needed for spasms (cramps).   . EPIPEN 2-PAK 0.3 MG/0.3ML SOAJ injection INJECT 1 PEN INTO MUSCLE OF LATERAL THIGH FOR SEVERE ALLERGIC REACTION. CALL 911 AFTER USING THIS MEDICATION.  [name] gabapentin (NEURONTIN) 100 MG capsule TAKE 1 TO 2 CAPSULES(100 TO 200 MG) BY MOUTH AT BEDTIME (Patient not taking: Reported on 10/10/2018)  . Magnesium 500 MG TABS Take 500 mg by mouth daily as needed (spasms).  . methocarbamol (ROBAXIN) 500 MG tablet Take 1 tablet (500 mg total) by mouth 3 (three) times daily. (Patient not taking: Reported on 10/10/2018)  . nystatin cream (MYCOSTATIN) Apply 1 application topically 2 (two) times daily. (Patient not taking: Reported on 10/10/2018)  . Probiotic Product (DIGESTIVE ADVANTAGE PO) Take by mouth.   No facility-administered encounter medications on file as of 10/10/2018.     Activities of Daily Living In  your present state of health, do you have any difficulty performing the following activities: 10/10/2018 03/10/2018  Hearing? Y N  Comment itchy ears. and states has noticed some loss. -  Vision? N N  Difficulty concentrating or making decisions? N N  Walking or climbing stairs? N N  Dressing or bathing? N N  Doing errands, shopping? N N  Preparing Food and eating ? N -  Using the Toilet? N -  In the past six months, have you accidently leaked urine? Y -  Comment patient states that happens all the time. -  Do you have problems with loss of bowel control? Y -  Comment has IBS -  Managing your Medications? N -  Managing your Finances? N -  Housekeeping or managing your Housekeeping? N -  Some recent data might be hidden    Patient Care Team: Hali Marry, MD as PCP - General (Family Medicine) Marcy Panning, MD (Hematology and Oncology) Lelon Perla, MD as Attending Physician (Cardiology) Evans Lance, MD (Cardiology) Jacques Navy, MD (Neurology) Dr. Caprice Kluver (Otolaryngology) Amada Kingfisher, MD (Inactive) as Consulting Physician (Hematology and Oncology) Gerre Pebbles, MD as Referring Physician (Gastroenterology)    Assessment:   This is a routine wellness examination for Marquelle.Physical assessment deferred to PCP.   Exercise Activities and Dietary recommendations Current Exercise Habits: Home exercise routine, Type of exercise: walking, Time (Minutes): 20, Frequency (Times/Week): 2, Weekly Exercise (Minutes/Week): 40, Intensity: Mild, Exercise limited by: Other - see comments Diet  Breakfast: sausage biscuit, cereal Lunch:  sandwich Dinner:  Meat and beans and a vegeable.      Goals    . Exercise 150 min/wk Moderate Activity     Wants to get back into going to the pool and do water exercising.       Fall Risk Fall Risk  10/10/2018 06/13/2018 08/27/2017 02/18/2017 01/15/2016  Falls in the past year? 0 No No No No  Number falls in past yr: - - - - -   Injury with Fall? - - - - -  Risk Factor Category  - - - - -  Risk for fall due to : Impaired balance/gait - - - -  Risk for fall due to: Comment states some days is shaky when walks. - - - -   Is the patient's home free of loose throw rugs in walkways, pet beds, electrical cords, etc?    shower      Grab bars in the bathroom? no      Handrails on the stairs?   yes      Adequate lighting?   yes   Depression Screen PHQ 2/9 Scores 10/10/2018 06/13/2018 05/24/2018 08/27/2017  PHQ - 2 Score 0 0 0 0     Cognitive Function     6CIT Screen 10/10/2018 10/05/2017  What Year? 0 points 0 points  What month? 0 points 0 points  What time? 0 points 0 points  Count back from 20 0 points 2 points  Months in reverse 2 points 2 points  Repeat phrase 0 points 6 points  Total Score 2 10    Immunization History  Administered Date(s) Administered  . Influenza Split 05/10/2012, 05/04/2014  . Influenza Whole 06/14/2009, 05/08/2010, 04/24/2011  . Influenza, High Dose Seasonal PF 05/17/2017, 05/24/2018  . Influenza,inj,Quad PF,6+ Mos 04/28/2013, 05/04/2014, 05/06/2015, 05/18/2016  . Pneumococcal Conjugate-13 05/25/2014  . Pneumococcal Polysaccharide-23 11/28/2012  . Td 08/24/2004, 03/07/2010  . Tdap 07/14/2012  . Zoster 04/24/2011  . Zoster Recombinat (Shingrix) 10/06/2017    Screening Tests Health Maintenance  Topic Date Due  . PNA vac Low Risk Adult (2 of 2 - PPSV23) 11/28/2017  . HEMOGLOBIN A1C  02/23/2019  . OPHTHALMOLOGY EXAM  04/08/2019  . COLONOSCOPY  06/13/2019  . FOOT EXAM  08/26/2019  . URINE MICROALBUMIN  08/26/2019  . MAMMOGRAM  09/08/2020  . TETANUS/TDAP  07/14/2022  . INFLUENZA VACCINE  Completed  . DEXA SCAN  Completed  . Hepatitis C Screening  Completed       Plan:    Ms. Keckler , Thank you for taking time to come for your Medicare Wellness Visit. I appreciate your ongoing commitment to your health goals. Please review the following plan we discussed and let me know  if I can assist you in the future. Continue doing brain stimulating activities (puzzles, reading, adult coloring books, staying active) to keep memory sharp.    These are the goals we discussed: Goals    . Exercise 150 min/wk Moderate Activity     Wants to get back into going to the pool and do water exercising.       This is a list of the screening recommended for you and due dates:  Health Maintenance  Topic Date Due  . Pneumonia vaccines (2 of 2 - PPSV23) 11/28/2017  . Hemoglobin A1C  02/23/2019  . Eye exam for diabetics  04/08/2019  . Colon Cancer Screening  06/13/2019  . Complete foot exam   08/26/2019  . Urine Protein Check  08/26/2019  . Mammogram  09/08/2020  . Tetanus Vaccine  07/14/2022  . Flu Shot  Completed  . DEXA scan (bone density measurement)  Completed  .  Hepatitis C: One time screening is recommended by Center for Disease Control  (CDC) for  adults born from [birth year] through 1967.   Completed     These are the goals we discussed: Goals    . Exercise 150  min/wk Moderate Activity     Wants to get back into going to the pool and do water exercising.       This is a list of the screening recommended for you and due dates:  Health Maintenance  Topic Date Due  . Pneumonia vaccines (2 of 2 - PPSV23) 11/28/2017  . Hemoglobin A1C  02/23/2019  . Eye exam for diabetics  04/08/2019  . Colon Cancer Screening  06/13/2019  . Complete foot exam   08/26/2019  . Urine Protein Check  08/26/2019  . Mammogram  09/08/2020  . Tetanus Vaccine  07/14/2022  . Flu Shot  Completed  . DEXA scan (bone density measurement)  Completed  .  Hepatitis C: One time screening is recommended by Center for Disease Control  (CDC) for  adults born from [birth year] through 1967.   Completed          These are the goals we discussed: Goals    . Exercise 150 min/wk Moderate Activity     Wants to get back into going to the pool and do water exercising.       This is a list of the screening  recommended for you and due dates:  Health Maintenance  Topic Date Due  . Pneumonia vaccines (2 of 2 - PPSV23) 11/28/2017  . Hemoglobin A1C  02/23/2019  . Eye exam for diabetics  04/08/2019  . Colon Cancer Screening  06/13/2019  . Complete foot exam   08/26/2019  . Urine Protein Check  08/26/2019  . Mammogram  09/08/2020  . Tetanus Vaccine  07/14/2022  . Flu Shot  Completed  . DEXA scan (bone density measurement)  Completed  .  Hepatitis C: One time screening is recommended by Center for Disease Control  (CDC) for  adults born from [birth year] through 1967.   Completed      I have personally reviewed and noted the following in the patient's chart:   . Medical and social history . Use of alcohol, tobacco or illicit drugs  . Current medications and supplements . Functional ability and status . Nutritional status . Physical activity . Advanced directives . List of other physicians . Hospitalizations, surgeries, and ER visits in previous 12 months . Vitals . Screenings to include cognitive, depression, and falls . Referrals and appointments  In addition, I have reviewed and discussed with patient certain preventive protocols, quality metrics, and best practice recommendations. A written personalized care plan for preventive services as well as general preventive health recommendations were provided to patient.     Joanne Chars, LPN  0/27/2536

## 2018-10-07 DIAGNOSIS — G4733 Obstructive sleep apnea (adult) (pediatric): Secondary | ICD-10-CM | POA: Diagnosis not present

## 2018-10-10 ENCOUNTER — Ambulatory Visit (INDEPENDENT_AMBULATORY_CARE_PROVIDER_SITE_OTHER): Payer: Medicare Other | Admitting: *Deleted

## 2018-10-10 VITALS — BP 142/70 | HR 72 | Ht 62.0 in | Wt 224.0 lb

## 2018-10-10 DIAGNOSIS — Z0000 Encounter for general adult medical examination without abnormal findings: Secondary | ICD-10-CM | POA: Diagnosis not present

## 2018-10-10 NOTE — Patient Instructions (Addendum)
Tanya Hogan , Thank you for taking time to come for your Medicare Wellness Visit. I appreciate your ongoing commitment to your health goals. Please review the following plan we discussed and let me know if I can assist you in the future. Continue doing brain stimulating activities (puzzles, reading, adult coloring books, staying active) to keep memory sharp.   These are the goals we discussed: Goals    . Exercise 150 min/wk Moderate Activity     Wants to get back into going to the pool and do water exercising.

## 2018-10-11 ENCOUNTER — Telehealth: Payer: Self-pay

## 2018-10-11 ENCOUNTER — Encounter: Payer: Self-pay | Admitting: Family Medicine

## 2018-10-11 LAB — CUP PACEART INCLINIC DEVICE CHECK
Date Time Interrogation Session: 20200218102207
Implantable Lead Implant Date: 20030324
Implantable Lead Implant Date: 20030324
Implantable Lead Location: 753859
Implantable Lead Location: 753860
Implantable Lead Model: 4068
Implantable Lead Model: 4068
Implantable Lead Model: 4193
Implantable Pulse Generator Implant Date: 20151231
MDC IDC LEAD IMPLANT DT: 20030324
MDC IDC LEAD LOCATION: 753858

## 2018-10-11 NOTE — Telephone Encounter (Signed)
No.  She should definitely be on it.  Thank you for adding to medication list.

## 2018-10-11 NOTE — Telephone Encounter (Signed)
Rhylei called and states the pravastatin was not on her medication list and she takes it daily. I added it back to her list. Do you have any reason she should not be on this medication?   It was discontinued at the discharge of a visit on 03/10/18.

## 2018-10-11 NOTE — Progress Notes (Signed)
Arpi called and states the pravastatin was not on her medication list and she takes it daily. I added it back to her list. Do you have any reason she should not be on this medication?

## 2018-10-12 ENCOUNTER — Ambulatory Visit (HOSPITAL_COMMUNITY)
Admission: RE | Admit: 2018-10-12 | Discharge: 2018-10-12 | Disposition: A | Discharge status: Home / Self Care | Payer: Medicare Other | Source: Ambulatory Visit | Attending: Internal Medicine | Admitting: Internal Medicine

## 2018-10-12 DIAGNOSIS — R0989 Other specified symptoms and signs involving the circulatory and respiratory systems: Secondary | ICD-10-CM | POA: Insufficient documentation

## 2018-10-17 NOTE — Progress Notes (Signed)
Remote pacemaker transmission.   

## 2018-11-04 DIAGNOSIS — G4737 Central sleep apnea in conditions classified elsewhere: Secondary | ICD-10-CM | POA: Diagnosis not present

## 2018-11-04 DIAGNOSIS — G4733 Obstructive sleep apnea (adult) (pediatric): Secondary | ICD-10-CM | POA: Diagnosis not present

## 2018-11-05 DIAGNOSIS — M5030 Other cervical disc degeneration, unspecified cervical region: Secondary | ICD-10-CM | POA: Diagnosis not present

## 2018-11-05 DIAGNOSIS — G4733 Obstructive sleep apnea (adult) (pediatric): Secondary | ICD-10-CM | POA: Diagnosis not present

## 2018-11-05 DIAGNOSIS — G4737 Central sleep apnea in conditions classified elsewhere: Secondary | ICD-10-CM | POA: Diagnosis not present

## 2018-11-15 ENCOUNTER — Other Ambulatory Visit: Payer: Self-pay | Admitting: Family Medicine

## 2018-11-15 ENCOUNTER — Other Ambulatory Visit: Payer: Self-pay | Admitting: Internal Medicine

## 2018-11-22 ENCOUNTER — Other Ambulatory Visit: Payer: Self-pay | Admitting: Family Medicine

## 2018-11-22 ENCOUNTER — Other Ambulatory Visit: Payer: Self-pay | Admitting: *Deleted

## 2018-11-23 ENCOUNTER — Other Ambulatory Visit: Payer: Self-pay | Admitting: *Deleted

## 2018-11-23 ENCOUNTER — Other Ambulatory Visit: Payer: Self-pay | Admitting: Family Medicine

## 2018-11-23 MED ORDER — NEBIVOLOL HCL 10 MG PO TABS: 10.0000 mg | ORAL_TABLET | Freq: Every day | ORAL | 1 refills | Status: DC

## 2018-11-24 ENCOUNTER — Ambulatory Visit: Payer: Medicare Other | Admitting: Family Medicine

## 2018-11-28 ENCOUNTER — Encounter: Payer: Self-pay | Admitting: Sports Medicine

## 2018-11-28 ENCOUNTER — Ambulatory Visit (INDEPENDENT_AMBULATORY_CARE_PROVIDER_SITE_OTHER): Payer: Medicare Other | Admitting: Sports Medicine

## 2018-11-28 ENCOUNTER — Other Ambulatory Visit: Payer: Self-pay

## 2018-11-28 DIAGNOSIS — K0510 Chronic gingivitis, plaque induced: Secondary | ICD-10-CM | POA: Diagnosis not present

## 2018-11-28 MED ORDER — CEFUROXIME AXETIL 500 MG PO TABS: 500.0000 mg | ORAL_TABLET | Freq: Two times a day (BID) | ORAL | 0 refills | Status: DC

## 2018-11-28 MED ORDER — VALACYCLOVIR HCL 1 G PO TABS: 1000.0000 mg | ORAL_TABLET | Freq: Two times a day (BID) | ORAL | 2 refills | Status: DC

## 2018-11-28 MED ORDER — TRIAMCINOLONE ACETONIDE 0.1 % MT PSTE: 1.0000 "application " | PASTE | Freq: Two times a day (BID) | OROMUCOSAL | 11 refills | Status: DC

## 2018-11-28 NOTE — Progress Notes (Signed)
Subjective:    CC: Sore on gums  HPI: This is a pleasant 68 year old female, for the past several days she is noted a painful sore on her upper gums, adjacent to the maxillary incisors.  This happened sometime ago and she responded well to Ceftin.  Symptoms are moderate, localized without radiation.  Worsening.  I reviewed the past medical history, family history, social history, surgical history, and allergies today and no changes were needed.  Please see the problem list section below in epic for further details.  Past Medical History: Past Medical History:  Diagnosis Date  . Alkaline phosphatase elevation   . Asthma   . Breast cancer ([place]) 2011  . Carpal tunnel syndrome   . Carpal tunnel syndrome    bilateral  . CHF (congestive heart failure) ([place])   . Cirrhosis, non-alcoholic ([place])   . Closed fracture of unspecified part of radius (alone)   . DDD (degenerative disc disease), lumbar   . Depression    pt denies  . Diabetes mellitus without complication ([place])   . Enlarged heart   . Fibromyalgia   . GERD (gastroesophageal reflux disease)   . History of kidney stones   . HTN (hypertension)   . Hypothyroidism   . IBS (irritable bowel syndrome)   . LBBB (left bundle branch block)   . Microcytic anemia 05/14/2017  . Pneumonia   . Presence of permanent cardiac pacemaker   . PVD (peripheral vascular disease) ([place])    2019  . Sleep apnea    uses cpap   Past Surgical History: Past Surgical History:  Procedure Laterality Date  . BREAST LUMPECTOMY Left 2011  . COLONOSCOPY    . FOOT SURGERY    . KNEE SURGERY    . PACEMAKER GENERATOR CHANGE N/A 08/23/2014   Procedure: PACEMAKER GENERATOR CHANGE;  Surgeon: Evans Lance, MD;  [facility] LAB;  Service: Cardiovascular;  Laterality: N/A;  . PACEMAKER PLACEMENT  2009   [facility]  . RECTAL SURGERY  1976  . TOTAL SHOULDER ARTHROPLASTY Right 03/10/2018  . TOTAL SHOULDER ARTHROPLASTY Right 03/10/2018   Procedure:  RIGHT TOTAL SHOULDER ARTHROPLASTY;  Surgeon: [name], MD;  Location: [facility];  Service: Orthopedics;  Laterality: Right;  . TUBAL LIGATION     Social History: Social History   Socioeconomic History  . Marital status: Divorced    Spouse name: Not on file  . Number of children: 2  . Years of education: 22  . Highest education level: Associate degree: academic program  Occupational History  . 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 Financial resource strain: Not hard at all  . Food insecurity:    Worry: Never true    Inability: Never true  . Transportation needs:    Medical: No    Non-medical: No  Tobacco Use  . Smoking status: Former Smoker    Packs/day: 0.00    Years: 0.00    Pack years: 0.00    Last attempt to quit: 08/24/1966    Years since quitting: 52.2  . Smokeless tobacco: Never Used  Substance and Sexual Activity  . Alcohol use: No  . Drug use: No  . Sexual activity: Not Currently  Lifestyle  . Physical activity:    Days per week: 0 days    Minutes per session: Not on file  . Stress: Not at all  Relationships  . Social connections:    Talks on phone: More than  three times a week    Gets together: Never    Attends religious service: Never    Active member of club or organization: No    Attends meetings of clubs or organizations: Never    Relationship status: Divorced  Other Topics Concern  . Not on file  Social History Narrative   Retired. 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 Likes Systems developer and music. Loves animals.   Family History: Family History  Problem Relation Age of Onset  . Melanoma Mother   . Heart failure Other   . Breast cancer Other   . Hypertension Other    Allergies: Allergies  Allergen Reactions  . Penicillins Anaphylaxis    Has patient had a PCN reaction causing immediate rash, facial/tongue/throat swelling, SOB or lightheadedness with hypotension: Yes Has patient had a  PCN reaction causing severe rash involving mucus membranes or skin necrosis: No Has patient had a PCN reaction that required hospitalization: No Has patient had a PCN reaction occurring within the last 10 years: No If all of the above answers are "NO", then may proceed with Cephalosporin use.   . Naproxen Swelling  . Accupril [Quinapril Hcl] Other (See Comments)    Hyperkalemia  . Aspirin Other (See Comments)    Pt states "burns holes in stomach", ulcers   . Atorvastatin Other (See Comments)    msucle aches  . Baking Soda-Fluoride [Sodium Fluoride] Itching  . Celebrex [Celecoxib]     ulcers  . Codeine Nausea And Vomiting  . Fluticasone Other (See Comments)    Nasal ulcer.    . Furosemide Other (See Comments)    Nose bleed  . Lactose Intolerance (Gi) Other (See Comments)    Increased calcium, sneezing, nasal issues  . Levocetirizine     Doesn't recall  . Losartan Potassium Itching  . Metformin And Related Diarrhea  . Nasonex [Mometasone Furoate] Other (See Comments)    nosebleed  . Rifampin Other (See Comments)    DOES NOT REMEMBER THIS MEDICATION OR ALLERGY  . Wellbutrin [Bupropion] Other (See Comments)    Muscle aches, pain, "made my skin crawl"  . Clindamycin/Lincomycin Rash  . Iodinated Diagnostic Agents Itching and Rash    Pt states she has had ultrasound with dye but was fine  . Lanolin-Petrolatum Rash  . Miralax [Polyethylene Glycol] Rash  . Quinine Rash  . Sulfa Antibiotics Rash  . Sulfonamide Derivatives Rash  . Tramadol Rash    Anxiety  . Wool Alcohol [Lanolin] Rash  . Zoledronic Acid Other (See Comments) and Rash    shaking   Medications: See med rec.  Review of Systems: No fevers, chills, night sweats, weight loss, chest pain, or shortness of breath.   Objective:    General: Well Developed, well nourished, and in no acute distress.  Neuro: Alert and oriented x3, extra-ocular muscles intact, sensation grossly intact.  HEENT: Normocephalic, atraumatic,  pupils equal round reactive to light, neck supple, no masses, no lymphadenopathy, thyroid nonpalpable.  There appears to be a mucocele with a papulovesicular component adjacent to the left maxillary incisors. Skin: Warm and dry, no rashes. Cardiac: Regular rate and rhythm, no murmurs rubs or gallops, no lower extremity edema.  Respiratory: Clear to auscultation bilaterally. Not using accessory muscles, speaking in full sentences.  Impression and Recommendations:    Gingivitis Sore in mouth. There may be a mucoid cyst versus herpetic lesion between the gums and the buccal mucosa. This appears to be adjacent to the left maxillary canine. Adding topical  triamcinolone dental paste, Valtrex, Ceftin (we have used Ceftin in the past without any issues). Her allergy list is 27 medications along indicating she is unlikely allergic to any of them. Return to see PCP if no better in a week.   ___________________________________________ [name]. [name], M.D., ABFM., CAQSM. Primary Care and Sports Medicine [facility]  Adjunct Professor of [employer]

## 2018-11-28 NOTE — Assessment & Plan Note (Signed)
Sore in mouth. There may be a mucoid cyst versus herpetic lesion between the gums and the buccal mucosa. This appears to be adjacent to the left maxillary canine. Adding topical triamcinolone dental paste, Valtrex, Ceftin (we have used Ceftin in the past without any issues). Her allergy list is 27 medications along indicating she is unlikely allergic to any of them. Return to see PCP if no better in a week.

## 2018-12-06 DIAGNOSIS — G4737 Central sleep apnea in conditions classified elsewhere: Secondary | ICD-10-CM | POA: Diagnosis not present

## 2018-12-06 DIAGNOSIS — M5030 Other cervical disc degeneration, unspecified cervical region: Secondary | ICD-10-CM | POA: Diagnosis not present

## 2018-12-06 DIAGNOSIS — G4733 Obstructive sleep apnea (adult) (pediatric): Secondary | ICD-10-CM | POA: Diagnosis not present

## 2018-12-07 ENCOUNTER — Telehealth: Payer: Self-pay | Admitting: Sports Medicine

## 2018-12-07 NOTE — Telephone Encounter (Signed)
Pt was seen for sore on her gum. States she completed Rx's 1-2 days ago and the bump is still there and hard. States it is the same color as the rest of her gum. Does report about 4 days ago it drained overnight while she was sleeping, did have her cpap mask on.   Questions if she needs to be reevaluated or if another round of Rx should be called in. Routing.

## 2018-12-07 NOTE — Telephone Encounter (Signed)
If it is simply a hard bump and not painful then my advice is to leave it alone, if it is still painful she likely needs to come in to see her PCP in the office to reevaluate.

## 2018-12-07 NOTE — Telephone Encounter (Signed)
Pt advised. Spot is still very sore, requesting appointment. PCP out of office the rest of the week, scheduled for tomorrow with Dr Georgina Snell for eval.

## 2018-12-08 ENCOUNTER — Other Ambulatory Visit: Payer: Self-pay

## 2018-12-08 ENCOUNTER — Encounter: Payer: Self-pay | Admitting: Family Medicine

## 2018-12-08 ENCOUNTER — Ambulatory Visit (INDEPENDENT_AMBULATORY_CARE_PROVIDER_SITE_OTHER): Payer: Medicare Other | Admitting: Family Medicine

## 2018-12-08 VITALS — BP 124/43 | HR 72 | Wt 222.0 lb

## 2018-12-08 DIAGNOSIS — K122 Cellulitis and abscess of mouth: Secondary | ICD-10-CM

## 2018-12-08 DIAGNOSIS — K0510 Chronic gingivitis, plaque induced: Secondary | ICD-10-CM

## 2018-12-08 MED ORDER — VALACYCLOVIR HCL 1 G PO TABS: 1000.0000 mg | ORAL_TABLET | Freq: Two times a day (BID) | ORAL | 2 refills | Status: DC

## 2018-12-08 MED ORDER — CEFUROXIME AXETIL 500 MG PO TABS: 500.0000 mg | ORAL_TABLET | Freq: Two times a day (BID) | ORAL | 0 refills | Status: DC

## 2018-12-08 NOTE — Progress Notes (Signed)
Tanya Hogan is a 68 y.o. female who presents to [facility] today for dental or oral lesion.  Tanya Hogan has a history of recurrent abscesses or infections in her left upper gumline.  She had a dental injection and at some point the past that punctured into her nose and since then she has had recurrent infections in that same area in her maxillary gumline.  She notes that starting a few weeks ago she had pain and swelling and was seen in clinic on April 6.  At that point she was thought to have a mucocele and was treated with empiric antibiotics that she has tolerated that pretty well in the past.  She notes on April 10 however while sleeping the swollen cyst in her maxillary gumline ruptured draining large amounts of pus.  Since then the cyst has drained significantly and the pain has improved quite a bit however she notes that it starting to reaccumulate she thinks.  She notes that she cannot get in with her dentist until at least June due to COVID-19 pandemic.  She denies fevers chills nausea vomiting or diarrhea.  She tolerated Ceftin antibiotics quite well in the past.    ROS as above:  Exam:  BP (!) 124/43   Pulse 72   Wt 222 lb (100.7 kg)   BMI 40.60 kg/m  Wt Readings from Last 5 Encounters:  12/08/18 222 lb (100.7 kg)  11/28/18 223 lb (101.2 kg)  10/10/18 224 lb (101.6 kg)  10/04/18 218 lb (98.9 kg)  09/01/18 217 lb (98.4 kg)    Gen: Well NAD HEENT: EOMI,  MMM small papule upper maxillary gumline tender to touch.  No fluctuance or expressible pus.  Mild erythema.  Tooth nontender. Lungs: Normal work of breathing. CTABL Heart: RRR no MRG Abd: NABS, Soft. Nondistended, Nontender Exts: Brisk capillary refill, warm and well perfused.   Lab and Radiology Results No results found for this or any previous visit (from the past 72 hour(s)). No results found.    Assessment and Plan: 68 y.o. female with dental infection.  Appears to be a recurrent issue and I am worried that she may have a fistula into her sinuses or perhaps a recurrent abscess.  Currently right now it is not drainable however I am worried that it may become drainable in the future.  Will treat with Ceftin antibiotic as she tolerated that in the past.  Additionally at some point it be reasonable for her to follow back up with ENT for potential surgical option planning.   CC: Dr Tanya Hogan at [facility] [address] [place], [place] [address] [phone] FAX  I spent 25 minutes with this patient, greater than 50% was face-to-face time counseling regarding ddx fistual and abscess and treatment plan.  Meds ordered this encounter  Medications  . cefUROXime (CEFTIN) 500 MG tablet    Sig: Take 1 tablet (500 mg total) by mouth 2 (two) times daily with a meal.    Dispense:  14 tablet    Refill:  0  . valACYclovir (VALTREX) 1000 MG tablet    Sig: Take 1 tablet (1,000 mg total) by mouth 2 (two) times daily.    Dispense:  14 tablet    Refill:  2     Historical information moved to improve visibility of documentation.  Past Medical History:  Diagnosis Date  . Alkaline phosphatase elevation   . Asthma   .  Breast cancer ([place]) 2011  . Carpal tunnel syndrome   . Carpal tunnel syndrome    bilateral  . CHF (congestive heart failure) ([place])   . Cirrhosis, non-alcoholic ([place])   . Closed fracture of unspecified part of radius (alone)   . DDD (degenerative disc disease), lumbar   . Depression    pt denies  . Diabetes mellitus without complication ([place])   . Enlarged heart   . Fibromyalgia   . GERD (gastroesophageal reflux disease)   . History of kidney stones   . HTN (hypertension)   . Hypothyroidism   . IBS (irritable bowel syndrome)   . LBBB (left bundle branch block)   . Microcytic anemia 05/14/2017  . Pneumonia   . Presence of permanent cardiac  pacemaker   . PVD (peripheral vascular disease) ([place])    2019  . Sleep apnea    uses cpap   Past Surgical History:  Procedure Laterality Date  . BREAST LUMPECTOMY Left 2011  . COLONOSCOPY    . FOOT SURGERY    . KNEE SURGERY    . PACEMAKER GENERATOR CHANGE N/A 08/23/2014   Procedure: PACEMAKER GENERATOR CHANGE;  Surgeon: Tanya Lance, MD;  Location: [facility] CATH LAB;  Service: Cardiovascular;  Laterality: N/A;  . PACEMAKER PLACEMENT  2009   [place] [facility]  . RECTAL SURGERY  1976  . TOTAL SHOULDER ARTHROPLASTY Right 03/10/2018  . TOTAL SHOULDER ARTHROPLASTY Right 03/10/2018   Procedure: RIGHT TOTAL SHOULDER ARTHROPLASTY;  Surgeon: Tanya Ade, MD;  Location: [facility];  Service: Orthopedics;  Laterality: Right;  . TUBAL LIGATION     Social History   Tobacco Use  . Smoking status: Former Smoker    Packs/day: 0.00    Years: 0.00    Pack years: 0.00    Last attempt to quit: 08/24/1966    Years since quitting: 52.3  . Smokeless tobacco: Never Used  Substance Use Topics  . Alcohol use: No   family history includes Breast cancer in an other family member; Heart failure in an other family member; Hypertension in an other family member; Melanoma in her mother.  Medications: Current Outpatient Medications  Medication Sig Dispense Refill  . ACCU-CHEK FASTCLIX LANCETS MISC Test twice a day. Dx:E11.8 204 each 11  . albuterol (PROVENTIL HFA;VENTOLIN HFA) 108 (90 Base) MCG/ACT inhaler Inhale 2 puffs into the lungs every 4 (four) hours as needed for wheezing or shortness of breath.    [name] alendronate (FOSAMAX) 70 MG tablet Take 1 tablet (70 mg total) by mouth every 7 (seven) days. Take with a full glass of water on an empty stomach. 12 tablet 4  . Blood Glucose Monitoring Suppl (ACCU-CHEK AVIVA PLUS) w/Device KIT For testing blood sugars twice daily. Dx: E11.8 1 kit 0  . bumetanide (BUMEX) 1 MG tablet TAKE 1 TABLET BY MOUTH TWO  TIMES DAILY 180 tablet 1  . Calcium Carb-Cholecalciferol  (CALCIUM 500/D) 500-400 MG-UNIT CHEW Chew 600 mg by mouth 2 (two) times daily.    . cefUROXime (CEFTIN) 500 MG tablet Take 1 tablet (500 mg total) by mouth 2 (two) times daily with a meal. 14 tablet 0  . clotrimazole-betamethasone (LOTRISONE) cream Apply 1 application topically 2 (two) times daily as needed.     . dicyclomine (BENTYL) 20 MG tablet Take 10-20 mg by mouth every 8 (eight) hours as needed for spasms (cramps).   11  . diltiazem (CARDIZEM) 120 MG tablet TAKE 1 TABLET BY MOUTH  DAILY 90 tablet 3  . EPIPEN 2-PAK  0.3 MG/0.3ML SOAJ injection INJECT 1 PEN INTO MUSCLE OF LATERAL THIGH FOR SEVERE ALLERGIC REACTION. CALL 911 AFTER USING THIS MEDICATION.  2  . Fluticasone-Salmeterol (ADVAIR) 250-50 MCG/DOSE AEPB Inhale 1 puff into the lungs 2 (two) times daily.    [name] gabapentin (NEURONTIN) 100 MG capsule TAKE 1 TO 2 CAPSULES(100 TO 200 MG) BY MOUTH AT BEDTIME 60 capsule 3  . glucose blood (ACCU-CHEK AVIVA PLUS) test strip For testing blood sugars twice daily. Dx: E11.8 200 each 4  . Insulin Pen Needle (BD PEN NEEDLE NANO U/F) 32G X 4 MM MISC Use once daily as directed 200 each 6  . LANTUS SOLOSTAR 100 UNIT/ML Solostar Pen INJECT SUBCUTANEOUSLY 40  UNITS AT BEDTIME 45 mL 4  . levalbuterol (XOPENEX) 1.25 MG/3ML nebulizer solution Take 1.25 mg by nebulization every 8 (eight) hours as needed for wheezing or shortness of breath.   2  . levothyroxine (SYNTHROID, LEVOTHROID) 75 MCG tablet TAKE 1 TABLET BY MOUTH  DAILY 90 tablet 1  . Magnesium 500 MG TABS Take 500 mg by mouth daily as needed (spasms).    . Multiple Vitamin (MULTIVITAMIN WITH MINERALS) TABS tablet Take 1 tablet by mouth daily.    . Multiple Vitamins-Minerals (OCUVITE EYE + MULTI) TABS Take 1 tablet by mouth daily.    . nebivolol (BYSTOLIC) 10 MG tablet Take 1 tablet (10 mg total) by mouth daily. 90 tablet 1  . nystatin cream (MYCOSTATIN) Apply 1 application topically 2 (two) times daily. 60 g 1  . Olopatadine HCl 0.2 % SOLN     .  pravastatin (PRAVACHOL) 40 MG tablet Take 40 mg by mouth daily.    . Probiotic Product (DIGESTIVE ADVANTAGE PO) Take by mouth.    . RESTASIS 0.05 % ophthalmic emulsion     . tiotropium (SPIRIVA) 18 MCG inhalation capsule Place 18 mcg into inhaler and inhale daily. Inhale the contents of 1 capsule via handhaler    . triamcinolone (KENALOG) 0.1 % paste Use as directed 1 application in the mouth or throat 2 (two) times daily. 5 g 11  . valACYclovir (VALTREX) 1000 MG tablet Take 1 tablet (1,000 mg total) by mouth 2 (two) times daily. 14 tablet 2   No current facility-administered medications for this visit.    Allergies  Allergen Reactions  . Penicillins Anaphylaxis    Has patient had a PCN reaction causing immediate rash, facial/tongue/throat swelling, SOB or lightheadedness with hypotension: Yes Has patient had a PCN reaction causing severe rash involving mucus membranes or skin necrosis: No Has patient had a PCN reaction that required hospitalization: No Has patient had a PCN reaction occurring within the last 10 years: No If all of the above answers are "NO", then may proceed with Cephalosporin use.   . Naproxen Swelling  . Accupril [Quinapril Hcl] Other (See Comments)    Hyperkalemia  . Aspirin Other (See Comments)    Pt states "burns holes in stomach", ulcers   . Atorvastatin Other (See Comments)    msucle aches  . Baking Soda-Fluoride [Sodium Fluoride] Itching  . Celebrex [Celecoxib]     ulcers  . Codeine Nausea And Vomiting  . Fluticasone Other (See Comments)    Nasal ulcer.    . Furosemide Other (See Comments)    Nose bleed  . Lactose Intolerance (Gi) Other (See Comments)    Increased calcium, sneezing, nasal issues  . Levocetirizine     Doesn't recall  . Losartan Potassium Itching  . Metformin And Related Diarrhea  .  Nasonex [Mometasone Furoate] Other (See Comments)    nosebleed  . Rifampin Other (See Comments)    DOES NOT REMEMBER THIS MEDICATION OR ALLERGY  .  Wellbutrin [Bupropion] Other (See Comments)    Muscle aches, pain, "made my skin crawl"  . Clindamycin/Lincomycin Rash  . Iodinated Diagnostic Agents Itching and Rash    Pt states she has had ultrasound with dye but was fine  . Lanolin-Petrolatum Rash  . Miralax [Polyethylene Glycol] Rash  . Quinine Rash  . Sulfa Antibiotics Rash  . Sulfonamide Derivatives Rash  . Tramadol Rash    Anxiety  . Wool Alcohol [Lanolin] Rash  . Zoledronic Acid Other (See Comments) and Rash    shaking     Discussed warning signs or symptoms. Please see discharge instructions. Patient expresses understanding.

## 2018-12-08 NOTE — Patient Instructions (Addendum)
Thank you for coming in today.  I think you are having repeating abscess in the gum possibly due to a Fistula.   Take the antibiotic for 7 days.  Restart valtrex as needed.    Dental Abscess  A dental abscess is a collection of pus in or around a tooth that results from an infection. An abscess can cause pain in the affected area as well as other symptoms. Treatment is important to help with symptoms and to prevent the infection from spreading. What are the causes? This condition is caused by a bacterial infection around the root of the tooth that involves the inner part of the tooth (pulp). It may result from:  Severe tooth decay.  Trauma to the tooth, such as a broken or chipped tooth, that allows bacteria to enter into the pulp.  Severe gum disease around a tooth. What increases the risk? This condition is more likely to develop in males. It is also more likely to develop in people who:  Have dental decay (cavities).  Eat sugary snacks between meals.  Use tobacco products.  Have diabetes.  Have a weakened disease-fighting system (immune system).  Do not brush and care for their teeth regularly. What are the signs or symptoms? Symptoms of this condition include:  Severe pain in and around the infected tooth.  Swelling and redness around the infected tooth, in the mouth, or in the face.  Tenderness.  Pus drainage.  Bad breath.  Bitter taste in the mouth.  Difficulty swallowing.  Difficulty opening the mouth.  Nausea.  Vomiting.  Chills.  Swollen neck glands.  Fever. How is this diagnosed? This condition is diagnosed based on:  Your symptoms and your medical and dental history.  An examination of the infected tooth. During the exam, your dentist may tap on the infected tooth. You may also have X-rays of the affected area. How is this treated? This condition is treated by getting rid of the infection. This may be done with:  Incision and drainage.  This procedure is done by making an incision in the abscess to drain out the pus. Removing pus is the first priority in treating an abscess.  Antibiotic medicines. These may be used in certain situations.  Antibacterial mouth rinse.  A root canal. This may be performed to save the tooth. Your dentist accesses the visible part of your tooth (crown) with a drill and removes any damaged pulp. Then the space is filled and sealed off.  Tooth extraction. The tooth is pulled out if it cannot be saved by other treatment. You may also receive treatment for pain, such as:  Acetaminophen or NSAIDs.  Gels that contain a numbing medicine.  An injection to block the pain near your nerve. Follow these instructions at home: Medicines  Take over-the-counter and prescription medicines only as told by your dentist.  If you were prescribed an antibiotic, take it as told by your dentist. Do not stop taking the antibiotic even if you start to feel better.  If you were prescribed a gel that contains a numbing medicine, use it exactly as told in the directions. Do not use these gels for children who are younger than 64 years of age.  Do not drive or use heavy machinery while taking prescription pain medicine. General instructions  Rinse out your mouth often with salt water to relieve pain or swelling. To make a salt-water mixture, completely dissolve -1 tsp of salt in 1 cup of warm water.  Eat a  soft diet while your abscess is healing.  Drink enough fluid to keep your urine pale yellow.  Do not apply heat to the outside of your mouth.  Do not use any products that contain nicotine or tobacco, such as cigarettes and e-cigarettes. If you need help quitting, ask your health care provider.  Keep all follow-up visits as told by your dentist. This is important. How is this prevented?  Brush your teeth every morning and night with fluoride toothpaste. Floss one time each day.  Get regularly scheduled  dental cleanings.  Consider having a dental sealant applied on teeth that have deep holes (caries).  Drink fluoridated water regularly. This includes most tap water. Check the label on bottled water to see if it contains fluoride.  Drink water instead of sugary drinks.  Eat healthy meals and snacks.  Wear a mouth guard or face shield to protect your teeth while playing sports. Contact a health care provider if:  Your pain is worse and is not helped by medicine. Get help right away if:  You have a fever or chills.  Your symptoms suddenly get worse.  You have a very bad headache.  You have problems breathing or swallowing.  You have trouble opening your mouth.  You have swelling in your neck or around your eye. Summary  A dental abscess is a collection of pus in or around a tooth that results from an infection.  A dental abscess may result from severe tooth decay, trauma to the tooth, or severe gum disease around a tooth.  Symptoms include severe pain, swelling, redness, and drainage of pus in and around the infected tooth.  The first priority in treating a dental abscess is to drain out the pus. Treatment may also involve removing damage inside the tooth (root canal) or pulling out (extracting) the tooth. This information is not intended to replace advice given to you by your health care provider. Make sure you discuss any questions you have with your health care provider. Document Released: 08/10/2005 Document Revised: 04/12/2017 Document Reviewed: 04/12/2017 Elsevier Interactive Patient Education  2019 [employer].

## 2018-12-13 NOTE — Progress Notes (Signed)
Notes faxed to [facility]

## 2018-12-13 NOTE — Progress Notes (Signed)
Can you please fax office note to:  CC: Dr Dimas Millin at [facility]  [address]  [place], [place] [address]  [phone]  [phone] FAX

## 2018-12-28 ENCOUNTER — Ambulatory Visit (INDEPENDENT_AMBULATORY_CARE_PROVIDER_SITE_OTHER): Payer: Medicare Other | Admitting: Family Medicine

## 2018-12-28 ENCOUNTER — Encounter: Payer: Self-pay | Admitting: Family Medicine

## 2018-12-28 VITALS — BP 125/73 | HR 77 | Temp 98.2°F | Ht 62.0 in | Wt 226.0 lb

## 2018-12-28 DIAGNOSIS — D649 Anemia, unspecified: Secondary | ICD-10-CM

## 2018-12-28 DIAGNOSIS — K7581 Nonalcoholic steatohepatitis (NASH): Secondary | ICD-10-CM | POA: Diagnosis not present

## 2018-12-28 DIAGNOSIS — Z23 Encounter for immunization: Secondary | ICD-10-CM | POA: Diagnosis not present

## 2018-12-28 DIAGNOSIS — H1013 Acute atopic conjunctivitis, bilateral: Secondary | ICD-10-CM

## 2018-12-28 DIAGNOSIS — I10 Essential (primary) hypertension: Secondary | ICD-10-CM

## 2018-12-28 DIAGNOSIS — E118 Type 2 diabetes mellitus with unspecified complications: Secondary | ICD-10-CM

## 2018-12-28 LAB — POCT GLYCOSYLATED HEMOGLOBIN (HGB A1C): Hemoglobin A1C: 6.5 % — AB (ref 4.0–5.6)

## 2018-12-28 MED ORDER — KETOTIFEN FUMARATE 0.025 % OP SOLN: 1.0000 [drp] | Freq: Two times a day (BID) | OPHTHALMIC | 1 refills | Status: DC

## 2018-12-28 NOTE — Progress Notes (Signed)
Subjective:    CC: BP and DM  HPI:  Hypertension- Pt denies chest pain, SOB, dizziness, or heart palpitations.  Taking meds as directed w/o problems.  Denies medication side effects.    Diabetes - no hypoglycemic events. No wounds or sores that are not healing well. No increased thirst or urination. Checking glucose at home. Taking medications as prescribed without any side effects.  F/U NASH -she did have an episode of right upper quadrant pain.  She says that it was a "good attack,.  It started Sunday night and was quite severe.  She says she is had gallbladder attacks before but this was the worst that she is ever experienced.  She was not sure if she ate something bad or not.  She was extremely nauseated and never vomited.  She said she did not go to the emergency room because she did not want to be exposed to [place].  She says she is actually been feeling better the last couple days but still feeling just a little nauseated with some decreased appetite.  Allergic conjunctivitis-she is currently on an eyedrop through her eye doctor for allergies but would like to try something different.  She feels like it is making her eyes feel a little bit more sore and sensitive.  But she also uses Restasis and says the Restasis has been burning a little bit when she puts it in lately.  She is unable to use a nasal steroid spray because of a hole in her septum.  Reports she does use her nasal saline before she puts on her CPAP at night.   She was also seen recently in April at the beginning of the month and then midmonth for oral abscess.  She says both times it drained a lot.  She says it is much smaller now she is had drainage a couple of times she does have a dental appointment scheduled for the beginning of June.  It was scheduled for this month but then was canceled because of COVID.  Past medical history, Surgical history, Family history not pertinant except as noted below, Social history, Allergies,  and medications have been entered into the medical record, reviewed, and corrections made.   Review of Systems: No fevers, chills, night sweats, weight loss, chest pain, or shortness of breath.   Objective:    General: Well Developed, well nourished, and in no acute distress.  Neuro: Alert and oriented x3, extra-ocular muscles intact, sensation grossly intact.  HEENT: Normocephalic, atraumatic  Skin: Warm and dry, no rashes. Cardiac: Regular rate and rhythm, no murmurs rubs or gallops, no lower extremity edema.  Respiratory: Clear to auscultation bilaterally. Not using accessory muscles, speaking in full sentences.   Impression and Recommendations:      HTN -pressure looks fantastic today.  We will continue to monitor.  DM -well controlled with hemoglobin A1c of 6.5.  Though she did have a few blood sugars recently that were in the 200s so just encouraged her to continue to monitor diet and stay active.  She has been trying to walk a little bit more around in the yard.  NASH -need to monitor liver enzymes every 6 months.  Anemia after surgery-was plan to recheck hemoglobin today as well as iron levels.  She has been taking a prenatal vitamin with extra iron.  Allergic conjunctivitis-I am still not very clear why she wants to change eyedrops but we can try Zaditor instead.  Prescription sent to pharmacy and she can try it  for a month.  May need to discuss any changes with her eye doctor as well.  If the Restasis has been burning when she puts it in lately then she also needs to contact her eye doctor about that.  Asthma-she has not had any wheezing but mostly some dry cough on and off for the last year.  We gave her a peak flow meter today and showed her how to properly use it.  We will try to track her peak flows when she is having days where she is coughing more to see if it could be related to her asthma or if it could be coming from something else such as postnasal drip or  reflux.  Right upper quadrant pain-she did not want me to do an exam on her belly today she really did not want me pressing on her so I did not she is feeling better which is reassuring.  Certainly if it happens again please let me know.  We will get some blood work today including a CBC just to make sure her white blood cell count is not elevated.  Pneumonia vaccine updated today.  She does have a follow-up with her dentist on June 1 she has had some gum infections and was seen in the office by my partners for this a couple times.  Right now she says it is doing okay but encouraged her to have her dentist check this out when she goes in.

## 2018-12-28 NOTE — Patient Instructions (Signed)
Please track your peak flows over the next 2-3 weeks and call or send a my chart with those numbers.

## 2018-12-28 NOTE — Progress Notes (Deleted)
Virtual Visit via Video Note  I connected with Tanya Hogan on 12/28/18 at  9:30 AM EDT by a video enabled telemedicine application and verified that I am speaking with the correct person using two identifiers.   I discussed the limitations of evaluation and management by telemedicine and the availability of in person appointments. The patient expressed understanding and agreed to proceed.  Subjective:    CC:   HPI:  Hypertension- Pt denies chest pain, SOB, dizziness, or heart palpitations.  Taking meds as directed w/o problems.  Denies medication side effects.    Diabetes - no hypoglycemic events. No wounds or sores that are not healing well. No increased thirst or urination. Checking glucose at home. Taking medications as prescribed without any side effects.  F/U NASH -     Past medical history, Surgical history, Family history not pertinant except as noted below, Social history, Allergies, and medications have been entered into the medical record, reviewed, and corrections made.   Review of Systems: No fevers, chills, night sweats, weight loss, chest pain, or shortness of breath.   Objective:    General: Speaking clearly in complete sentences without any shortness of breath.  Alert and oriented x3.  Normal judgment. No apparent acute distress.    Impression and Recommendations:   HTN -   DM -   NASH -        I discussed the assessment and treatment plan with the patient. The patient was provided an opportunity to ask questions and all were answered. The patient agreed with the plan and demonstrated an understanding of the instructions.   The patient was advised to call back or seek an in-person evaluation if the symptoms worsen or if the condition fails to improve as anticipated.   Beatrice Lecher, MD

## 2018-12-29 LAB — FERRITIN: Ferritin: 52 ng/mL (ref 16–288)

## 2018-12-29 LAB — COMPLETE METABOLIC PANEL WITH GFR
AG Ratio: 1.6 (calc) (ref 1.0–2.5)
ALT: 22 U/L (ref 6–29)
AST: 26 U/L (ref 10–35)
Albumin: 4.6 g/dL (ref 3.6–5.1)
Alkaline phosphatase (APISO): 79 U/L (ref 37–153)
BUN: 16 mg/dL (ref 7–25)
CO2: 27 mmol/L (ref 20–32)
Calcium: 10.1 mg/dL (ref 8.6–10.4)
Chloride: 104 mmol/L (ref 98–110)
Creat: 0.8 mg/dL (ref 0.50–0.99)
GFR, Est African American: 87 mL/min/{1.73_m2} (ref >=60)
GFR, Est Non African American: 75 mL/min/{1.73_m2} (ref >=60)
Globulin: 2.8 g/dL (calc) (ref 1.9–3.7)
Glucose, Bld: 141 mg/dL — ABNORMAL HIGH (ref 65–99)
Potassium: 4.9 mmol/L (ref 3.5–5.3)
Sodium: 139 mmol/L (ref 135–146)
Total Bilirubin: 0.6 mg/dL (ref 0.2–1.2)
Total Protein: 7.4 g/dL (ref 6.1–8.1)

## 2018-12-29 LAB — CBC
HCT: 45.2 % — ABNORMAL HIGH (ref 35.0–45.0)
Hemoglobin: 15 g/dL (ref 11.7–15.5)
MCH: 30.7 pg (ref 27.0–33.0)
MCHC: 33.2 g/dL (ref 32.0–36.0)
MCV: 92.6 fL (ref 80.0–100.0)
MPV: 10.5 fL (ref 7.5–12.5)
Platelets: 357 10*3/uL (ref 140–400)
RBC: 4.88 10*6/uL (ref 3.80–5.10)
RDW: 13.1 % (ref 11.0–15.0)
WBC: 7.8 10*3/uL (ref 3.8–10.8)

## 2019-01-02 ENCOUNTER — Telehealth: Payer: Self-pay | Admitting: Family Medicine

## 2019-01-02 ENCOUNTER — Other Ambulatory Visit: Payer: Self-pay

## 2019-01-02 ENCOUNTER — Ambulatory Visit (INDEPENDENT_AMBULATORY_CARE_PROVIDER_SITE_OTHER): Payer: Medicare Other | Admitting: *Deleted

## 2019-01-02 DIAGNOSIS — I442 Atrioventricular block, complete: Secondary | ICD-10-CM | POA: Diagnosis not present

## 2019-01-02 DIAGNOSIS — I5022 Chronic systolic (congestive) heart failure: Secondary | ICD-10-CM

## 2019-01-02 NOTE — Telephone Encounter (Signed)
She would like for a nurse to call her on her cell and let her know what her labs mean and if she needs to do anything for those results

## 2019-01-02 NOTE — Telephone Encounter (Signed)
Spoke with Pt, clarified questions. Nothing further required.

## 2019-01-03 LAB — CUP PACEART REMOTE DEVICE CHECK
Battery Remaining Longevity: 41 mo
Battery Voltage: 2.97 V
Brady Statistic AP VP Percent: 49.59 %
Brady Statistic AP VS Percent: 0.07 %
Brady Statistic AS VP Percent: 48 %
Brady Statistic AS VS Percent: 2.35 %
Brady Statistic RA Percent Paced: 47.52 %
Brady Statistic RV Percent Paced: 94.49 %
Date Time Interrogation Session: 20200511102716
Implantable Lead Implant Date: 20030324
Implantable Lead Implant Date: 20030324
Implantable Lead Implant Date: 20030324
Implantable Lead Location: 753858
Implantable Lead Location: 753859
Implantable Lead Location: 753860
Implantable Lead Model: 4068
Implantable Lead Model: 4068
Implantable Lead Model: 4193
Implantable Pulse Generator Implant Date: 20151231
Lead Channel Impedance Value: 1102 Ohm
Lead Channel Impedance Value: 1273 Ohm
Lead Channel Impedance Value: 4047 Ohm
Lead Channel Impedance Value: 4047 Ohm
Lead Channel Impedance Value: 4047 Ohm
Lead Channel Impedance Value: 418 Ohm
Lead Channel Impedance Value: 437 Ohm
Lead Channel Impedance Value: 437 Ohm
Lead Channel Impedance Value: 570 Ohm
Lead Channel Pacing Threshold Amplitude: 1 V
Lead Channel Pacing Threshold Amplitude: 2 V
Lead Channel Pacing Threshold Pulse Width: 0.4 ms
Lead Channel Pacing Threshold Pulse Width: 0.4 ms
Lead Channel Sensing Intrinsic Amplitude: 1.375 mV
Lead Channel Sensing Intrinsic Amplitude: 1.375 mV
Lead Channel Sensing Intrinsic Amplitude: 7 mV
Lead Channel Sensing Intrinsic Amplitude: 7 mV
Lead Channel Setting Pacing Amplitude: 1.5 V
Lead Channel Setting Pacing Amplitude: 2.5 V
Lead Channel Setting Pacing Amplitude: 3.5 V
Lead Channel Setting Pacing Pulse Width: 0.8 ms
Lead Channel Setting Pacing Pulse Width: 0.8 ms
Lead Channel Setting Sensing Sensitivity: 4 mV

## 2019-01-05 DIAGNOSIS — G4737 Central sleep apnea in conditions classified elsewhere: Secondary | ICD-10-CM | POA: Diagnosis not present

## 2019-01-05 DIAGNOSIS — G4733 Obstructive sleep apnea (adult) (pediatric): Secondary | ICD-10-CM | POA: Diagnosis not present

## 2019-01-05 DIAGNOSIS — M5030 Other cervical disc degeneration, unspecified cervical region: Secondary | ICD-10-CM | POA: Diagnosis not present

## 2019-01-11 ENCOUNTER — Telehealth: Payer: Self-pay | Admitting: Family Medicine

## 2019-01-11 NOTE — Telephone Encounter (Signed)
Pt left VM that she has been using the home asthma meter and all of her readings have been in the yellow. Advised she was supposed to call PCP with update.

## 2019-01-12 MED ORDER — FLUTICASONE PROPIONATE HFA 110 MCG/ACT IN AERO: 2.0000 | INHALATION_SPRAY | Freq: Two times a day (BID) | RESPIRATORY_TRACT | 1 refills | Status: DC

## 2019-01-12 NOTE — Telephone Encounter (Signed)
Pt advised. She questions if she should take this in addition to her other 2 inhalers or if the Flovent replaces one. Routing for clarification.   She reports her cough is the same, no better or worse.

## 2019-01-12 NOTE — Telephone Encounter (Signed)
OK, script sent for FLovent to the pharmacy to use every day.  Is her cough any better or about the same?

## 2019-01-13 NOTE — Telephone Encounter (Signed)
Pt did already get Rx. She will let us know how she is tolerating it, and for next refill she would prefer the combo. She will call us when ready for that.   The allergy eye drops are not helping that were sent in. She got some Zyrtec OTC that also didn't help but reports now that she has used the new inhaler her drainage has gotten better.

## 2019-01-13 NOTE — Telephone Encounter (Signed)
If she has not picked it up then let me change what you are doing.  I can send over a combination that has the Spiriva already and it instead of 2 separate inhalers.

## 2019-01-13 NOTE — Addendum Note (Signed)
Addended by: Beatrice Lecher D on: 01/13/2019 11:53 AM   Modules accepted: Orders

## 2019-01-17 DIAGNOSIS — J4540 Moderate persistent asthma, uncomplicated: Secondary | ICD-10-CM | POA: Diagnosis not present

## 2019-01-17 DIAGNOSIS — Z87891 Personal history of nicotine dependence: Secondary | ICD-10-CM | POA: Diagnosis not present

## 2019-01-17 DIAGNOSIS — Z7189 Other specified counseling: Secondary | ICD-10-CM | POA: Diagnosis not present

## 2019-01-17 DIAGNOSIS — I5022 Chronic systolic (congestive) heart failure: Secondary | ICD-10-CM | POA: Diagnosis not present

## 2019-01-17 DIAGNOSIS — Z7951 Long term (current) use of inhaled steroids: Secondary | ICD-10-CM | POA: Diagnosis not present

## 2019-01-17 DIAGNOSIS — Z713 Dietary counseling and surveillance: Secondary | ICD-10-CM | POA: Diagnosis not present

## 2019-01-17 DIAGNOSIS — Z9989 Dependence on other enabling machines and devices: Secondary | ICD-10-CM | POA: Diagnosis not present

## 2019-01-17 DIAGNOSIS — G4733 Obstructive sleep apnea (adult) (pediatric): Secondary | ICD-10-CM | POA: Diagnosis not present

## 2019-01-17 NOTE — Progress Notes (Signed)
Remote pacemaker transmission.   

## 2019-01-19 ENCOUNTER — Telehealth: Payer: Self-pay

## 2019-01-19 MED ORDER — FLUTICASONE PROPIONATE HFA 110 MCG/ACT IN AERO: 2.0000 | INHALATION_SPRAY | Freq: Two times a day (BID) | RESPIRATORY_TRACT | 1 refills | Status: DC

## 2019-01-19 NOTE — Telephone Encounter (Signed)
I called Tanya Hogan. She wasn't home. I will call back later.

## 2019-01-19 NOTE — Telephone Encounter (Signed)
Patient advised.

## 2019-01-19 NOTE — Telephone Encounter (Signed)
OK, no problem.  New prescription sent to mail order.  Glad to hear that she is doing well.

## 2019-01-19 NOTE — Telephone Encounter (Signed)
Patient called stating that she met with her pulmonologist and he was very happy with how she is doing with Flovent. Patient wanted to know if Dr Madilyn Fireman would consider sending in just a 90 day supply to mail order for her to continue, and then re-evaluate any needs at her follow up with Dr Madilyn Fireman in August.   Please advise and send if ok

## 2019-01-30 ENCOUNTER — Other Ambulatory Visit: Payer: Self-pay | Admitting: *Deleted

## 2019-01-30 MED ORDER — ALBUTEROL SULFATE HFA 108 (90 BASE) MCG/ACT IN AERS: 2.0000 | INHALATION_SPRAY | RESPIRATORY_TRACT | 3 refills | Status: DC | PRN

## 2019-01-31 DIAGNOSIS — G4733 Obstructive sleep apnea (adult) (pediatric): Secondary | ICD-10-CM | POA: Diagnosis not present

## 2019-01-31 DIAGNOSIS — G4737 Central sleep apnea in conditions classified elsewhere: Secondary | ICD-10-CM | POA: Diagnosis not present

## 2019-02-02 ENCOUNTER — Other Ambulatory Visit: Payer: Self-pay | Admitting: *Deleted

## 2019-02-02 ENCOUNTER — Other Ambulatory Visit: Payer: Self-pay | Admitting: Family Medicine

## 2019-02-02 MED ORDER — PROAIR HFA 108 (90 BASE) MCG/ACT IN AERS: 1.0000 | INHALATION_SPRAY | Freq: Four times a day (QID) | RESPIRATORY_TRACT | 1 refills | Status: DC | PRN

## 2019-02-02 NOTE — Progress Notes (Signed)
Ventolin no longer covered by her insurance.  New prescription sent for pro-air HFA to [facility].

## 2019-02-13 ENCOUNTER — Telehealth: Payer: Self-pay

## 2019-02-13 MED ORDER — PROAIR HFA 108 (90 BASE) MCG/ACT IN AERS: 1.0000 | INHALATION_SPRAY | Freq: Four times a day (QID) | RESPIRATORY_TRACT | 0 refills | Status: DC | PRN

## 2019-02-13 NOTE — Telephone Encounter (Signed)
Patient states her proair was delayed coming in from mail order and is completely out.    Sending RX to [facility] for 1 inhaler to hold her over until it arrives

## 2019-02-14 ENCOUNTER — Encounter: Payer: Self-pay | Admitting: Family Medicine

## 2019-02-14 ENCOUNTER — Ambulatory Visit (INDEPENDENT_AMBULATORY_CARE_PROVIDER_SITE_OTHER): Payer: Medicare Other | Admitting: Sports Medicine

## 2019-02-14 ENCOUNTER — Encounter: Payer: Self-pay | Admitting: Sports Medicine

## 2019-02-14 ENCOUNTER — Ambulatory Visit (INDEPENDENT_AMBULATORY_CARE_PROVIDER_SITE_OTHER): Payer: Medicare Other | Admitting: Family Medicine

## 2019-02-14 VITALS — BP 123/78 | HR 83 | Ht 62.0 in | Wt 220.0 lb

## 2019-02-14 DIAGNOSIS — B370 Candidal stomatitis: Secondary | ICD-10-CM | POA: Diagnosis not present

## 2019-02-14 DIAGNOSIS — E118 Type 2 diabetes mellitus with unspecified complications: Secondary | ICD-10-CM | POA: Diagnosis not present

## 2019-02-14 DIAGNOSIS — M5030 Other cervical disc degeneration, unspecified cervical region: Secondary | ICD-10-CM

## 2019-02-14 DIAGNOSIS — N644 Mastodynia: Secondary | ICD-10-CM | POA: Diagnosis not present

## 2019-02-14 MED ORDER — NYSTATIN 100000 UNIT/ML MT SUSP: 5.0000 mL | Freq: Four times a day (QID) | OROMUCOSAL | 0 refills | Status: DC

## 2019-02-14 NOTE — Assessment & Plan Note (Addendum)
Tanya Hogan has C5-C6 cervical DDD, left-sided neck pain. She is unable to take NSAIDs, she has a pacemaker so we had to get a CT. She is trying to avoid a cervical epidural secondary to her diabetes, she is homebound, and has great difficulty leaving the home safely, so for this reason we will try home health physical therapy which should be free for Medicare patients. On Tylenol for pain, Tramadol causes excessive sedation. Return to see me in 6 weeks, if persistent discomfort we will proceed with an epidural.
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
## 2019-02-14 NOTE — Progress Notes (Signed)
Subjective:    CC: Neck pain  HPI: Tanya Hogan is a 68 year old female with cervical DDD, we diagnosed her back in January, I had recommended a cervical epidural, she never got this done.  Pain is moderate, persistent, localized on the left side of the neck with radiation into the left trapezius.  I reviewed the past medical history, family history, social history, surgical history, and allergies today and no changes were needed.  Please see the problem list section below in epic for further details.  Past Medical History: Past Medical History:  Diagnosis Date  . Alkaline phosphatase elevation   . Asthma   . Breast cancer ([place]) 2011  . Carpal tunnel syndrome   . Carpal tunnel syndrome    bilateral  . CHF (congestive heart failure) ([place])   . Cirrhosis, non-alcoholic ([place])   . Closed fracture of unspecified part of radius (alone)   . DDD (degenerative disc disease), lumbar   . Depression    pt denies  . Diabetes mellitus without complication ([place])   . Enlarged heart   . Fibromyalgia   . GERD (gastroesophageal reflux disease)   . History of kidney stones   . HTN (hypertension)   . Hypothyroidism   . IBS (irritable bowel syndrome)   . LBBB (left bundle branch block)   . Microcytic anemia 05/14/2017  . Pneumonia   . Presence of permanent cardiac pacemaker   . PVD (peripheral vascular disease) ([place])    2019  . Sleep apnea    uses cpap   Past Surgical History: Past Surgical History:  Procedure Laterality Date  . BREAST LUMPECTOMY Left 2011  . COLONOSCOPY    . FOOT SURGERY    . KNEE SURGERY    . PACEMAKER GENERATOR CHANGE N/A 08/23/2014   Procedure: PACEMAKER GENERATOR CHANGE;  Surgeon: Evans Lance, MD;  Location: [facility] CATH LAB;  Service: Cardiovascular;  Laterality: N/A;  . PACEMAKER PLACEMENT  2009   [facility]  . RECTAL SURGERY  1976  . TOTAL SHOULDER ARTHROPLASTY Right 03/10/2018  . TOTAL SHOULDER ARTHROPLASTY Right 03/10/2018   Procedure: RIGHT TOTAL SHOULDER  ARTHROPLASTY;  Surgeon: [name], MD;  Location: [facility];  Service: Orthopedics;  Laterality: Right;  . TUBAL LIGATION     Social History: Social History   Socioeconomic History  . Marital status: Divorced    Spouse name: Not on file  . Number of children: 2  . Years of education: 8  . Highest education level: Associate degree: academic program  Occupational History  . 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 Financial resource strain: Not hard at all  . Food insecurity    Worry: Never true    Inability: Never true  . Transportation needs    Medical: No    Non-medical: No  Tobacco Use  . Smoking status: Former Smoker    Packs/day: 0.00    Years: 0.00    Pack years: 0.00    Quit date: 08/24/1966    Years since quitting: 52.5  . Smokeless tobacco: Never Used  Substance and Sexual Activity  . Alcohol use: No  . Drug use: No  . Sexual activity: Not Currently  Lifestyle  . Physical activity    Days per week: 0 days    Minutes per session: Not on file  . Stress: Not at all  Relationships  . Social connections    Talks on phone: More than three times a week  Gets together: Never    Attends religious service: Never    Active member of club or organization: No    Attends meetings of clubs or organizations: Never    Relationship status: Divorced  Other Topics Concern  . Not on file  Social History Narrative   Retired. 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 Likes Systems developer and music. Loves animals.   Family History: Family History  Problem Relation Age of Onset  . Melanoma Mother   . Heart failure Other   . Breast cancer Other   . Hypertension Other    Allergies: Allergies  Allergen Reactions  . Penicillins Anaphylaxis    Has patient had a PCN reaction causing immediate rash, facial/tongue/throat swelling, SOB or lightheadedness with hypotension: Yes Has patient had a PCN reaction causing severe rash  involving mucus membranes or skin necrosis: No Has patient had a PCN reaction that required hospitalization: No Has patient had a PCN reaction occurring within the last 10 years: No If all of the above answers are "NO", then may proceed with Cephalosporin use.   . Naproxen Swelling  . Accupril [Quinapril Hcl] Other (See Comments)    Hyperkalemia  . Aspirin Other (See Comments)    Pt states "burns holes in stomach", ulcers   . Atorvastatin Other (See Comments)    msucle aches  . Baking Soda-Fluoride [Sodium Fluoride] Itching  . Celebrex [Celecoxib]     ulcers  . Codeine Nausea And Vomiting  . Fluticasone Other (See Comments)    Nasal ulcer.    . Furosemide Other (See Comments)    Nose bleed  . Lactose Intolerance (Gi) Other (See Comments)    Increased calcium, sneezing, nasal issues  . Levocetirizine     Doesn't recall  . Losartan Potassium Itching  . Metformin And Related Diarrhea  . Nasonex [Mometasone Furoate] Other (See Comments)    nosebleed  . Rifampin Other (See Comments)    DOES NOT REMEMBER THIS MEDICATION OR ALLERGY  . Wellbutrin [Bupropion] Other (See Comments)    Muscle aches, pain, "made my skin crawl"  . Clindamycin/Lincomycin Rash  . Iodinated Diagnostic Agents Itching and Rash    Pt states she has had ultrasound with dye but was fine  . Lanolin-Petrolatum Rash  . Miralax [Polyethylene Glycol] Rash  . Quinine Rash  . Sulfa Antibiotics Rash  . Sulfonamide Derivatives Rash  . Tramadol Rash    Anxiety  . Wool Alcohol [Lanolin] Rash  . Zoledronic Acid Other (See Comments) and Rash    shaking   Medications: See med rec.  Review of Systems: No fevers, chills, night sweats, weight loss, chest pain, or shortness of breath.   Objective:    General: Well Developed, well nourished, and in no acute distress.  Neuro: Alert and oriented x3, extra-ocular muscles intact, sensation grossly intact.  HEENT: Normocephalic, atraumatic, pupils equal round reactive to  light, neck supple, no masses, no lymphadenopathy, thyroid nonpalpable.  Skin: Warm and dry, no rashes. Cardiac: Regular rate and rhythm, no murmurs rubs or gallops, no lower extremity edema.  Respiratory: Clear to auscultation bilaterally. Not using accessory muscles, speaking in full sentences.  Impression and Recommendations:    DDD (degenerative disc disease), cervical Tanya Hogan has C5-C6 cervical DDD, left-sided neck pain. She is unable to take NSAIDs, she has a pacemaker so we had to get a CT. She is trying to avoid a cervical epidural secondary to her diabetes, she is homebound, and has great difficulty leaving the home safely, so for  this reason we will try home health physical therapy which should be free for Medicare patients. On Tylenol for pain, Tramadol causes excessive sedation. Return to see me in 6 weeks, if persistent discomfort we will proceed with an epidural.  I spent 25 minutes with this patient, greater than 50% was face-to-face time counseling regarding the above diagnoses.  ___________________________________________ [name]. [name], M.D., ABFM., CAQSM. Primary Care and Sports Medicine [facility]  Adjunct Professor of [place] [employer]

## 2019-02-15 DIAGNOSIS — Z87891 Personal history of nicotine dependence: Secondary | ICD-10-CM | POA: Diagnosis not present

## 2019-02-15 DIAGNOSIS — E785 Hyperlipidemia, unspecified: Secondary | ICD-10-CM | POA: Diagnosis not present

## 2019-02-15 DIAGNOSIS — Z951 Presence of aortocoronary bypass graft: Secondary | ICD-10-CM | POA: Diagnosis not present

## 2019-02-15 DIAGNOSIS — M50322 Other cervical disc degeneration at C5-C6 level: Secondary | ICD-10-CM | POA: Diagnosis not present

## 2019-02-15 DIAGNOSIS — M797 Fibromyalgia: Secondary | ICD-10-CM | POA: Diagnosis not present

## 2019-02-15 DIAGNOSIS — Z8781 Personal history of (healed) traumatic fracture: Secondary | ICD-10-CM | POA: Diagnosis not present

## 2019-02-15 DIAGNOSIS — E1151 Type 2 diabetes mellitus with diabetic peripheral angiopathy without gangrene: Secondary | ICD-10-CM | POA: Diagnosis not present

## 2019-02-15 DIAGNOSIS — I5022 Chronic systolic (congestive) heart failure: Secondary | ICD-10-CM | POA: Diagnosis not present

## 2019-02-15 DIAGNOSIS — Z96611 Presence of right artificial shoulder joint: Secondary | ICD-10-CM | POA: Diagnosis not present

## 2019-02-15 DIAGNOSIS — K7581 Nonalcoholic steatohepatitis (NASH): Secondary | ICD-10-CM | POA: Diagnosis not present

## 2019-02-15 DIAGNOSIS — M19072 Primary osteoarthritis, left ankle and foot: Secondary | ICD-10-CM | POA: Diagnosis not present

## 2019-02-15 DIAGNOSIS — Z853 Personal history of malignant neoplasm of breast: Secondary | ICD-10-CM | POA: Diagnosis not present

## 2019-02-15 DIAGNOSIS — J4540 Moderate persistent asthma, uncomplicated: Secondary | ICD-10-CM | POA: Diagnosis not present

## 2019-02-15 DIAGNOSIS — D649 Anemia, unspecified: Secondary | ICD-10-CM | POA: Diagnosis not present

## 2019-02-15 DIAGNOSIS — E039 Hypothyroidism, unspecified: Secondary | ICD-10-CM | POA: Diagnosis not present

## 2019-02-15 DIAGNOSIS — G5602 Carpal tunnel syndrome, left upper limb: Secondary | ICD-10-CM | POA: Diagnosis not present

## 2019-02-15 DIAGNOSIS — G4733 Obstructive sleep apnea (adult) (pediatric): Secondary | ICD-10-CM | POA: Diagnosis not present

## 2019-02-15 DIAGNOSIS — G5601 Carpal tunnel syndrome, right upper limb: Secondary | ICD-10-CM | POA: Diagnosis not present

## 2019-02-15 DIAGNOSIS — I447 Left bundle-branch block, unspecified: Secondary | ICD-10-CM | POA: Diagnosis not present

## 2019-02-15 DIAGNOSIS — K219 Gastro-esophageal reflux disease without esophagitis: Secondary | ICD-10-CM | POA: Diagnosis not present

## 2019-02-15 DIAGNOSIS — I110 Hypertensive heart disease with heart failure: Secondary | ICD-10-CM | POA: Diagnosis not present

## 2019-02-18 ENCOUNTER — Encounter: Payer: Self-pay | Admitting: Family Medicine

## 2019-02-20 DIAGNOSIS — D649 Anemia, unspecified: Secondary | ICD-10-CM | POA: Diagnosis not present

## 2019-02-20 DIAGNOSIS — M797 Fibromyalgia: Secondary | ICD-10-CM | POA: Diagnosis not present

## 2019-02-20 DIAGNOSIS — Z87891 Personal history of nicotine dependence: Secondary | ICD-10-CM | POA: Diagnosis not present

## 2019-02-20 DIAGNOSIS — Z951 Presence of aortocoronary bypass graft: Secondary | ICD-10-CM | POA: Diagnosis not present

## 2019-02-20 DIAGNOSIS — G5601 Carpal tunnel syndrome, right upper limb: Secondary | ICD-10-CM | POA: Diagnosis not present

## 2019-02-20 DIAGNOSIS — Z96611 Presence of right artificial shoulder joint: Secondary | ICD-10-CM | POA: Diagnosis not present

## 2019-02-20 DIAGNOSIS — G4733 Obstructive sleep apnea (adult) (pediatric): Secondary | ICD-10-CM | POA: Diagnosis not present

## 2019-02-20 DIAGNOSIS — Z8781 Personal history of (healed) traumatic fracture: Secondary | ICD-10-CM | POA: Diagnosis not present

## 2019-02-20 DIAGNOSIS — E1151 Type 2 diabetes mellitus with diabetic peripheral angiopathy without gangrene: Secondary | ICD-10-CM | POA: Diagnosis not present

## 2019-02-20 DIAGNOSIS — K7581 Nonalcoholic steatohepatitis (NASH): Secondary | ICD-10-CM | POA: Diagnosis not present

## 2019-02-20 DIAGNOSIS — Z853 Personal history of malignant neoplasm of breast: Secondary | ICD-10-CM | POA: Diagnosis not present

## 2019-02-20 DIAGNOSIS — J4540 Moderate persistent asthma, uncomplicated: Secondary | ICD-10-CM | POA: Diagnosis not present

## 2019-02-20 DIAGNOSIS — I447 Left bundle-branch block, unspecified: Secondary | ICD-10-CM | POA: Diagnosis not present

## 2019-02-20 DIAGNOSIS — M19072 Primary osteoarthritis, left ankle and foot: Secondary | ICD-10-CM | POA: Diagnosis not present

## 2019-02-20 DIAGNOSIS — E785 Hyperlipidemia, unspecified: Secondary | ICD-10-CM | POA: Diagnosis not present

## 2019-02-20 DIAGNOSIS — M50322 Other cervical disc degeneration at C5-C6 level: Secondary | ICD-10-CM | POA: Diagnosis not present

## 2019-02-20 DIAGNOSIS — G5602 Carpal tunnel syndrome, left upper limb: Secondary | ICD-10-CM | POA: Diagnosis not present

## 2019-02-20 DIAGNOSIS — I5022 Chronic systolic (congestive) heart failure: Secondary | ICD-10-CM | POA: Diagnosis not present

## 2019-02-20 DIAGNOSIS — K219 Gastro-esophageal reflux disease without esophagitis: Secondary | ICD-10-CM | POA: Diagnosis not present

## 2019-02-20 DIAGNOSIS — E039 Hypothyroidism, unspecified: Secondary | ICD-10-CM | POA: Diagnosis not present

## 2019-02-20 DIAGNOSIS — I110 Hypertensive heart disease with heart failure: Secondary | ICD-10-CM | POA: Diagnosis not present

## 2019-02-20 NOTE — Telephone Encounter (Signed)
Called patient, states she had been constipated. Patient ate some almonds and restarted probiotics and it seemed to help with bowels but she had some rectal bleeding around anus. States she has known hemorrhoids but they have not been bothersome in a while.   Described blood as bright red and fresh. Bleeding incident happened Friday night, couple teaspoons possible, and then small spotting Saturday.   Patient wondering if possibly the nystatin caused some hemorrhoids issues .. please advise

## 2019-02-20 NOTE — Telephone Encounter (Signed)
Patient advised. No further concerns.. will call back if happens again

## 2019-02-20 NOTE — Telephone Encounter (Signed)
No, the nystatin does not cause hemorrhoids or cause them to get irritated or flare.  If she was having a more hard bowel movement and straining or the opposite, it was a more loose bowel movement sometimes that can irritate hemorrhoids.  At this point I would just keep an eye on it and if it happens again then let us know.  But if it seems to have slowed down and stopped on its own and she is able to just keep her bowels moving with a soft movement and not having to strain much then it should get better on its own.

## 2019-02-22 DIAGNOSIS — I447 Left bundle-branch block, unspecified: Secondary | ICD-10-CM | POA: Diagnosis not present

## 2019-02-22 DIAGNOSIS — Z8781 Personal history of (healed) traumatic fracture: Secondary | ICD-10-CM | POA: Diagnosis not present

## 2019-02-22 DIAGNOSIS — G5601 Carpal tunnel syndrome, right upper limb: Secondary | ICD-10-CM | POA: Diagnosis not present

## 2019-02-22 DIAGNOSIS — Z87891 Personal history of nicotine dependence: Secondary | ICD-10-CM | POA: Diagnosis not present

## 2019-02-22 DIAGNOSIS — Z96611 Presence of right artificial shoulder joint: Secondary | ICD-10-CM | POA: Diagnosis not present

## 2019-02-22 DIAGNOSIS — G5602 Carpal tunnel syndrome, left upper limb: Secondary | ICD-10-CM | POA: Diagnosis not present

## 2019-02-22 DIAGNOSIS — M50322 Other cervical disc degeneration at C5-C6 level: Secondary | ICD-10-CM | POA: Diagnosis not present

## 2019-02-22 DIAGNOSIS — G4733 Obstructive sleep apnea (adult) (pediatric): Secondary | ICD-10-CM | POA: Diagnosis not present

## 2019-02-22 DIAGNOSIS — E1151 Type 2 diabetes mellitus with diabetic peripheral angiopathy without gangrene: Secondary | ICD-10-CM | POA: Diagnosis not present

## 2019-02-22 DIAGNOSIS — E785 Hyperlipidemia, unspecified: Secondary | ICD-10-CM | POA: Diagnosis not present

## 2019-02-22 DIAGNOSIS — J4540 Moderate persistent asthma, uncomplicated: Secondary | ICD-10-CM | POA: Diagnosis not present

## 2019-02-22 DIAGNOSIS — Z853 Personal history of malignant neoplasm of breast: Secondary | ICD-10-CM | POA: Diagnosis not present

## 2019-02-22 DIAGNOSIS — K7581 Nonalcoholic steatohepatitis (NASH): Secondary | ICD-10-CM | POA: Diagnosis not present

## 2019-02-22 DIAGNOSIS — M19072 Primary osteoarthritis, left ankle and foot: Secondary | ICD-10-CM | POA: Diagnosis not present

## 2019-02-22 DIAGNOSIS — E039 Hypothyroidism, unspecified: Secondary | ICD-10-CM | POA: Diagnosis not present

## 2019-02-22 DIAGNOSIS — Z951 Presence of aortocoronary bypass graft: Secondary | ICD-10-CM | POA: Diagnosis not present

## 2019-02-22 DIAGNOSIS — D649 Anemia, unspecified: Secondary | ICD-10-CM | POA: Diagnosis not present

## 2019-02-22 DIAGNOSIS — M797 Fibromyalgia: Secondary | ICD-10-CM | POA: Diagnosis not present

## 2019-02-22 DIAGNOSIS — I5022 Chronic systolic (congestive) heart failure: Secondary | ICD-10-CM | POA: Diagnosis not present

## 2019-02-22 DIAGNOSIS — K219 Gastro-esophageal reflux disease without esophagitis: Secondary | ICD-10-CM | POA: Diagnosis not present

## 2019-02-22 DIAGNOSIS — I110 Hypertensive heart disease with heart failure: Secondary | ICD-10-CM | POA: Diagnosis not present

## 2019-02-27 DIAGNOSIS — M25511 Pain in right shoulder: Secondary | ICD-10-CM | POA: Diagnosis not present

## 2019-02-28 DIAGNOSIS — Z853 Personal history of malignant neoplasm of breast: Secondary | ICD-10-CM | POA: Diagnosis not present

## 2019-02-28 DIAGNOSIS — Z951 Presence of aortocoronary bypass graft: Secondary | ICD-10-CM | POA: Diagnosis not present

## 2019-02-28 DIAGNOSIS — Z8781 Personal history of (healed) traumatic fracture: Secondary | ICD-10-CM | POA: Diagnosis not present

## 2019-02-28 DIAGNOSIS — E1151 Type 2 diabetes mellitus with diabetic peripheral angiopathy without gangrene: Secondary | ICD-10-CM | POA: Diagnosis not present

## 2019-02-28 DIAGNOSIS — G4733 Obstructive sleep apnea (adult) (pediatric): Secondary | ICD-10-CM | POA: Diagnosis not present

## 2019-02-28 DIAGNOSIS — E039 Hypothyroidism, unspecified: Secondary | ICD-10-CM | POA: Diagnosis not present

## 2019-02-28 DIAGNOSIS — K219 Gastro-esophageal reflux disease without esophagitis: Secondary | ICD-10-CM | POA: Diagnosis not present

## 2019-02-28 DIAGNOSIS — G5602 Carpal tunnel syndrome, left upper limb: Secondary | ICD-10-CM | POA: Diagnosis not present

## 2019-02-28 DIAGNOSIS — M50322 Other cervical disc degeneration at C5-C6 level: Secondary | ICD-10-CM | POA: Diagnosis not present

## 2019-02-28 DIAGNOSIS — J4540 Moderate persistent asthma, uncomplicated: Secondary | ICD-10-CM | POA: Diagnosis not present

## 2019-02-28 DIAGNOSIS — G5601 Carpal tunnel syndrome, right upper limb: Secondary | ICD-10-CM | POA: Diagnosis not present

## 2019-02-28 DIAGNOSIS — I447 Left bundle-branch block, unspecified: Secondary | ICD-10-CM | POA: Diagnosis not present

## 2019-02-28 DIAGNOSIS — M19072 Primary osteoarthritis, left ankle and foot: Secondary | ICD-10-CM | POA: Diagnosis not present

## 2019-02-28 DIAGNOSIS — Z87891 Personal history of nicotine dependence: Secondary | ICD-10-CM | POA: Diagnosis not present

## 2019-02-28 DIAGNOSIS — K7581 Nonalcoholic steatohepatitis (NASH): Secondary | ICD-10-CM | POA: Diagnosis not present

## 2019-02-28 DIAGNOSIS — D649 Anemia, unspecified: Secondary | ICD-10-CM | POA: Diagnosis not present

## 2019-02-28 DIAGNOSIS — E785 Hyperlipidemia, unspecified: Secondary | ICD-10-CM | POA: Diagnosis not present

## 2019-02-28 DIAGNOSIS — Z96611 Presence of right artificial shoulder joint: Secondary | ICD-10-CM | POA: Diagnosis not present

## 2019-02-28 DIAGNOSIS — I5022 Chronic systolic (congestive) heart failure: Secondary | ICD-10-CM | POA: Diagnosis not present

## 2019-02-28 DIAGNOSIS — M797 Fibromyalgia: Secondary | ICD-10-CM | POA: Diagnosis not present

## 2019-02-28 DIAGNOSIS — I110 Hypertensive heart disease with heart failure: Secondary | ICD-10-CM | POA: Diagnosis not present

## 2019-03-01 DIAGNOSIS — I447 Left bundle-branch block, unspecified: Secondary | ICD-10-CM | POA: Diagnosis not present

## 2019-03-01 DIAGNOSIS — M50322 Other cervical disc degeneration at C5-C6 level: Secondary | ICD-10-CM | POA: Diagnosis not present

## 2019-03-01 DIAGNOSIS — D649 Anemia, unspecified: Secondary | ICD-10-CM | POA: Diagnosis not present

## 2019-03-01 DIAGNOSIS — M797 Fibromyalgia: Secondary | ICD-10-CM | POA: Diagnosis not present

## 2019-03-01 DIAGNOSIS — Z951 Presence of aortocoronary bypass graft: Secondary | ICD-10-CM | POA: Diagnosis not present

## 2019-03-01 DIAGNOSIS — G5601 Carpal tunnel syndrome, right upper limb: Secondary | ICD-10-CM | POA: Diagnosis not present

## 2019-03-01 DIAGNOSIS — I110 Hypertensive heart disease with heart failure: Secondary | ICD-10-CM | POA: Diagnosis not present

## 2019-03-01 DIAGNOSIS — E785 Hyperlipidemia, unspecified: Secondary | ICD-10-CM | POA: Diagnosis not present

## 2019-03-01 DIAGNOSIS — Z853 Personal history of malignant neoplasm of breast: Secondary | ICD-10-CM | POA: Diagnosis not present

## 2019-03-01 DIAGNOSIS — Z87891 Personal history of nicotine dependence: Secondary | ICD-10-CM | POA: Diagnosis not present

## 2019-03-01 DIAGNOSIS — J4540 Moderate persistent asthma, uncomplicated: Secondary | ICD-10-CM | POA: Diagnosis not present

## 2019-03-01 DIAGNOSIS — E039 Hypothyroidism, unspecified: Secondary | ICD-10-CM | POA: Diagnosis not present

## 2019-03-01 DIAGNOSIS — M19072 Primary osteoarthritis, left ankle and foot: Secondary | ICD-10-CM | POA: Diagnosis not present

## 2019-03-01 DIAGNOSIS — E1151 Type 2 diabetes mellitus with diabetic peripheral angiopathy without gangrene: Secondary | ICD-10-CM | POA: Diagnosis not present

## 2019-03-01 DIAGNOSIS — K219 Gastro-esophageal reflux disease without esophagitis: Secondary | ICD-10-CM | POA: Diagnosis not present

## 2019-03-01 DIAGNOSIS — Z8781 Personal history of (healed) traumatic fracture: Secondary | ICD-10-CM | POA: Diagnosis not present

## 2019-03-01 DIAGNOSIS — G4733 Obstructive sleep apnea (adult) (pediatric): Secondary | ICD-10-CM | POA: Diagnosis not present

## 2019-03-01 DIAGNOSIS — Z96611 Presence of right artificial shoulder joint: Secondary | ICD-10-CM | POA: Diagnosis not present

## 2019-03-01 DIAGNOSIS — G5602 Carpal tunnel syndrome, left upper limb: Secondary | ICD-10-CM | POA: Diagnosis not present

## 2019-03-01 DIAGNOSIS — I5022 Chronic systolic (congestive) heart failure: Secondary | ICD-10-CM | POA: Diagnosis not present

## 2019-03-01 DIAGNOSIS — K7581 Nonalcoholic steatohepatitis (NASH): Secondary | ICD-10-CM | POA: Diagnosis not present

## 2019-03-06 DIAGNOSIS — R5383 Other fatigue: Secondary | ICD-10-CM | POA: Insufficient documentation

## 2019-03-06 DIAGNOSIS — Z8614 Personal history of Methicillin resistant Staphylococcus aureus infection: Secondary | ICD-10-CM | POA: Insufficient documentation

## 2019-03-06 DIAGNOSIS — C50919 Malignant neoplasm of unspecified site of unspecified female breast: Secondary | ICD-10-CM | POA: Insufficient documentation

## 2019-03-06 DIAGNOSIS — J45909 Unspecified asthma, uncomplicated: Secondary | ICD-10-CM | POA: Insufficient documentation

## 2019-03-06 DIAGNOSIS — N62 Hypertrophy of breast: Secondary | ICD-10-CM | POA: Diagnosis not present

## 2019-03-07 ENCOUNTER — Other Ambulatory Visit: Payer: Self-pay | Admitting: *Deleted

## 2019-03-07 DIAGNOSIS — G5601 Carpal tunnel syndrome, right upper limb: Secondary | ICD-10-CM | POA: Diagnosis not present

## 2019-03-07 DIAGNOSIS — E039 Hypothyroidism, unspecified: Secondary | ICD-10-CM | POA: Diagnosis not present

## 2019-03-07 DIAGNOSIS — Z951 Presence of aortocoronary bypass graft: Secondary | ICD-10-CM | POA: Diagnosis not present

## 2019-03-07 DIAGNOSIS — E1151 Type 2 diabetes mellitus with diabetic peripheral angiopathy without gangrene: Secondary | ICD-10-CM | POA: Diagnosis not present

## 2019-03-07 DIAGNOSIS — D649 Anemia, unspecified: Secondary | ICD-10-CM | POA: Diagnosis not present

## 2019-03-07 DIAGNOSIS — I110 Hypertensive heart disease with heart failure: Secondary | ICD-10-CM | POA: Diagnosis not present

## 2019-03-07 DIAGNOSIS — G4733 Obstructive sleep apnea (adult) (pediatric): Secondary | ICD-10-CM | POA: Diagnosis not present

## 2019-03-07 DIAGNOSIS — K219 Gastro-esophageal reflux disease without esophagitis: Secondary | ICD-10-CM | POA: Diagnosis not present

## 2019-03-07 DIAGNOSIS — K7581 Nonalcoholic steatohepatitis (NASH): Secondary | ICD-10-CM | POA: Diagnosis not present

## 2019-03-07 DIAGNOSIS — Z853 Personal history of malignant neoplasm of breast: Secondary | ICD-10-CM | POA: Diagnosis not present

## 2019-03-07 DIAGNOSIS — M19072 Primary osteoarthritis, left ankle and foot: Secondary | ICD-10-CM | POA: Diagnosis not present

## 2019-03-07 DIAGNOSIS — Z96611 Presence of right artificial shoulder joint: Secondary | ICD-10-CM | POA: Diagnosis not present

## 2019-03-07 DIAGNOSIS — J4540 Moderate persistent asthma, uncomplicated: Secondary | ICD-10-CM | POA: Diagnosis not present

## 2019-03-07 DIAGNOSIS — Z8781 Personal history of (healed) traumatic fracture: Secondary | ICD-10-CM | POA: Diagnosis not present

## 2019-03-07 DIAGNOSIS — I5022 Chronic systolic (congestive) heart failure: Secondary | ICD-10-CM | POA: Diagnosis not present

## 2019-03-07 DIAGNOSIS — G5602 Carpal tunnel syndrome, left upper limb: Secondary | ICD-10-CM | POA: Diagnosis not present

## 2019-03-07 DIAGNOSIS — M50322 Other cervical disc degeneration at C5-C6 level: Secondary | ICD-10-CM | POA: Diagnosis not present

## 2019-03-07 DIAGNOSIS — Z87891 Personal history of nicotine dependence: Secondary | ICD-10-CM | POA: Diagnosis not present

## 2019-03-07 DIAGNOSIS — M797 Fibromyalgia: Secondary | ICD-10-CM | POA: Diagnosis not present

## 2019-03-07 DIAGNOSIS — I447 Left bundle-branch block, unspecified: Secondary | ICD-10-CM | POA: Diagnosis not present

## 2019-03-07 DIAGNOSIS — E785 Hyperlipidemia, unspecified: Secondary | ICD-10-CM | POA: Diagnosis not present

## 2019-03-07 MED ORDER — ALENDRONATE SODIUM 70 MG PO TABS: 70.0000 mg | ORAL_TABLET | ORAL | 4 refills | Status: DC

## 2019-03-09 DIAGNOSIS — Z951 Presence of aortocoronary bypass graft: Secondary | ICD-10-CM | POA: Diagnosis not present

## 2019-03-09 DIAGNOSIS — E1151 Type 2 diabetes mellitus with diabetic peripheral angiopathy without gangrene: Secondary | ICD-10-CM | POA: Diagnosis not present

## 2019-03-09 DIAGNOSIS — Z8781 Personal history of (healed) traumatic fracture: Secondary | ICD-10-CM | POA: Diagnosis not present

## 2019-03-09 DIAGNOSIS — G5601 Carpal tunnel syndrome, right upper limb: Secondary | ICD-10-CM | POA: Diagnosis not present

## 2019-03-09 DIAGNOSIS — I447 Left bundle-branch block, unspecified: Secondary | ICD-10-CM | POA: Diagnosis not present

## 2019-03-09 DIAGNOSIS — M19072 Primary osteoarthritis, left ankle and foot: Secondary | ICD-10-CM | POA: Diagnosis not present

## 2019-03-09 DIAGNOSIS — M797 Fibromyalgia: Secondary | ICD-10-CM | POA: Diagnosis not present

## 2019-03-09 DIAGNOSIS — K219 Gastro-esophageal reflux disease without esophagitis: Secondary | ICD-10-CM | POA: Diagnosis not present

## 2019-03-09 DIAGNOSIS — G5602 Carpal tunnel syndrome, left upper limb: Secondary | ICD-10-CM | POA: Diagnosis not present

## 2019-03-09 DIAGNOSIS — K7581 Nonalcoholic steatohepatitis (NASH): Secondary | ICD-10-CM | POA: Diagnosis not present

## 2019-03-09 DIAGNOSIS — E039 Hypothyroidism, unspecified: Secondary | ICD-10-CM | POA: Diagnosis not present

## 2019-03-09 DIAGNOSIS — M50322 Other cervical disc degeneration at C5-C6 level: Secondary | ICD-10-CM | POA: Diagnosis not present

## 2019-03-09 DIAGNOSIS — J4540 Moderate persistent asthma, uncomplicated: Secondary | ICD-10-CM | POA: Diagnosis not present

## 2019-03-09 DIAGNOSIS — I5022 Chronic systolic (congestive) heart failure: Secondary | ICD-10-CM | POA: Diagnosis not present

## 2019-03-09 DIAGNOSIS — E785 Hyperlipidemia, unspecified: Secondary | ICD-10-CM | POA: Diagnosis not present

## 2019-03-09 DIAGNOSIS — Z87891 Personal history of nicotine dependence: Secondary | ICD-10-CM | POA: Diagnosis not present

## 2019-03-09 DIAGNOSIS — I110 Hypertensive heart disease with heart failure: Secondary | ICD-10-CM | POA: Diagnosis not present

## 2019-03-09 DIAGNOSIS — D649 Anemia, unspecified: Secondary | ICD-10-CM | POA: Diagnosis not present

## 2019-03-09 DIAGNOSIS — Z96611 Presence of right artificial shoulder joint: Secondary | ICD-10-CM | POA: Diagnosis not present

## 2019-03-09 DIAGNOSIS — Z853 Personal history of malignant neoplasm of breast: Secondary | ICD-10-CM | POA: Diagnosis not present

## 2019-03-09 DIAGNOSIS — G4733 Obstructive sleep apnea (adult) (pediatric): Secondary | ICD-10-CM | POA: Diagnosis not present

## 2019-03-14 ENCOUNTER — Other Ambulatory Visit: Payer: Self-pay

## 2019-03-14 ENCOUNTER — Ambulatory Visit (INDEPENDENT_AMBULATORY_CARE_PROVIDER_SITE_OTHER): Payer: Medicare Other

## 2019-03-14 ENCOUNTER — Encounter: Payer: Self-pay | Admitting: Family Medicine

## 2019-03-14 ENCOUNTER — Ambulatory Visit (INDEPENDENT_AMBULATORY_CARE_PROVIDER_SITE_OTHER): Payer: Medicare Other | Admitting: Family Medicine

## 2019-03-14 VITALS — BP 124/56 | HR 69 | Ht 62.0 in | Wt 218.0 lb

## 2019-03-14 DIAGNOSIS — G4733 Obstructive sleep apnea (adult) (pediatric): Secondary | ICD-10-CM | POA: Diagnosis not present

## 2019-03-14 DIAGNOSIS — K7581 Nonalcoholic steatohepatitis (NASH): Secondary | ICD-10-CM | POA: Diagnosis not present

## 2019-03-14 DIAGNOSIS — E1151 Type 2 diabetes mellitus with diabetic peripheral angiopathy without gangrene: Secondary | ICD-10-CM | POA: Diagnosis not present

## 2019-03-14 DIAGNOSIS — I110 Hypertensive heart disease with heart failure: Secondary | ICD-10-CM | POA: Diagnosis not present

## 2019-03-14 DIAGNOSIS — K219 Gastro-esophageal reflux disease without esophagitis: Secondary | ICD-10-CM | POA: Diagnosis not present

## 2019-03-14 DIAGNOSIS — I5022 Chronic systolic (congestive) heart failure: Secondary | ICD-10-CM | POA: Diagnosis not present

## 2019-03-14 DIAGNOSIS — D649 Anemia, unspecified: Secondary | ICD-10-CM | POA: Diagnosis not present

## 2019-03-14 DIAGNOSIS — J4540 Moderate persistent asthma, uncomplicated: Secondary | ICD-10-CM | POA: Diagnosis not present

## 2019-03-14 DIAGNOSIS — R1030 Lower abdominal pain, unspecified: Secondary | ICD-10-CM | POA: Diagnosis not present

## 2019-03-14 DIAGNOSIS — I447 Left bundle-branch block, unspecified: Secondary | ICD-10-CM | POA: Diagnosis not present

## 2019-03-14 DIAGNOSIS — K625 Hemorrhage of anus and rectum: Secondary | ICD-10-CM

## 2019-03-14 DIAGNOSIS — E785 Hyperlipidemia, unspecified: Secondary | ICD-10-CM | POA: Diagnosis not present

## 2019-03-14 DIAGNOSIS — G5601 Carpal tunnel syndrome, right upper limb: Secondary | ICD-10-CM | POA: Diagnosis not present

## 2019-03-14 DIAGNOSIS — Z96611 Presence of right artificial shoulder joint: Secondary | ICD-10-CM | POA: Diagnosis not present

## 2019-03-14 DIAGNOSIS — Z853 Personal history of malignant neoplasm of breast: Secondary | ICD-10-CM | POA: Diagnosis not present

## 2019-03-14 DIAGNOSIS — Z87891 Personal history of nicotine dependence: Secondary | ICD-10-CM | POA: Diagnosis not present

## 2019-03-14 DIAGNOSIS — M797 Fibromyalgia: Secondary | ICD-10-CM | POA: Diagnosis not present

## 2019-03-14 DIAGNOSIS — M50322 Other cervical disc degeneration at C5-C6 level: Secondary | ICD-10-CM | POA: Diagnosis not present

## 2019-03-14 DIAGNOSIS — M19072 Primary osteoarthritis, left ankle and foot: Secondary | ICD-10-CM | POA: Diagnosis not present

## 2019-03-14 DIAGNOSIS — Z8781 Personal history of (healed) traumatic fracture: Secondary | ICD-10-CM | POA: Diagnosis not present

## 2019-03-14 DIAGNOSIS — Z951 Presence of aortocoronary bypass graft: Secondary | ICD-10-CM | POA: Diagnosis not present

## 2019-03-14 DIAGNOSIS — K5730 Diverticulosis of large intestine without perforation or abscess without bleeding: Secondary | ICD-10-CM | POA: Diagnosis not present

## 2019-03-14 DIAGNOSIS — E039 Hypothyroidism, unspecified: Secondary | ICD-10-CM | POA: Diagnosis not present

## 2019-03-14 DIAGNOSIS — G5602 Carpal tunnel syndrome, left upper limb: Secondary | ICD-10-CM | POA: Diagnosis not present

## 2019-03-14 NOTE — Patient Instructions (Signed)
Okay to take a stool softener with each meal and if you still need to take something extra and okay to use a laxative such as MiraLAX or Dulcolax at bedtime if still not moving bowels normally.  Make sure drinking plenty of water to keep the stools softer.

## 2019-03-14 NOTE — Progress Notes (Signed)
She reports that her sxs began 6 days ago. She said that she has "gut" pain it is worse on the R side and it feels like a knife. The pain also affects the L. She said that  Last week she was changing the light bulbs and felt a pop and noticed bright red blood. A few days later she was changing another light and was on a ladder and felt another pop again had bright red blood this time within a few hours she said that it was brown.  she said that whenever she is unable to empty her bowels she has taken some OTC dietary fiber she took 5 capsules x 3 days she also took pantoprazole once and dicyclomine to see if this would help. She stated that she still doesn't feel as if her bowels are empty. Her last BM was this am and it was a little ball. She hasn't had a complete BM x 4days.Marland KitchenMarland KitchenElouise Munroe, [place]

## 2019-03-14 NOTE — Progress Notes (Signed)
Acute Office Visit  Subjective:    Patient ID: Tanya Hogan, female    DOB: [birth year], 68 y.o.   MRN: [id number]  Chief Complaint  Patient presents with  . dark stools    HPI Patient is in today for She reports that her sxs began 6 days ago.  She says she was putting up a light bulb and felt a sudden sharp stinging pain in the left lower abdomen.  Afterwards she noticed some bright red blood in the stool.   A few days later she was changing another light and was on a ladder and felt another pop again, but this time it was in the right lower quadrant had bright red blood this time within a few hours she said that it was brown.  She says a couple of days ago the stool actually look like it was a tarry black stool. No vomiting or nausea.     she said that whenever she is unable to empty her bowels she has taken some OTC dietary fiber she took 5 capsules x 3 days she also took pantoprazole once and dicyclomine to see if this would help. She stated that she still doesn't feel as if her bowels are empty. Her last BM was this am and it was a little ball. She hasn't had a complete BM x 4days she has had a rectal repair.  Last colonoscopy was June 12, 2014 with [facility].  She had 2 polyps removed and they recommended repeat follow-up in 5 years so she is due for follow-up in 3 months.  Past Medical History:  Diagnosis Date  . Alkaline phosphatase elevation   . Asthma   . Breast cancer ([place]) 2011  . Carpal tunnel syndrome   . Carpal tunnel syndrome    bilateral  . CHF (congestive heart failure) ([place])   . Cirrhosis, non-alcoholic ([place])   . Closed fracture of unspecified part of radius (alone)   . DDD (degenerative disc disease), lumbar   . Depression    pt denies  . Diabetes mellitus without complication ([place])   . Enlarged heart   . Fibromyalgia   . GERD (gastroesophageal reflux disease)   . History of kidney stones   . HTN (hypertension)   . Hypothyroidism   . IBS  (irritable bowel syndrome)   . LBBB (left bundle branch block)   . Microcytic anemia 05/14/2017  . Pneumonia   . Presence of permanent cardiac pacemaker   . PVD (peripheral vascular disease) ([place])    2019  . Sleep apnea    uses cpap    Past Surgical History:  Procedure Laterality Date  . BREAST LUMPECTOMY Left 2011  . COLONOSCOPY    . FOOT SURGERY    . KNEE SURGERY    . PACEMAKER GENERATOR CHANGE N/A 08/23/2014   Procedure: PACEMAKER GENERATOR CHANGE;  Surgeon: Evans Lance, MD;  Location: [facility] CATH LAB;  Service: Cardiovascular;  Laterality: N/A;  . PACEMAKER PLACEMENT  2009   [facility]  . RECTAL SURGERY  1976  . TOTAL SHOULDER ARTHROPLASTY Right 03/10/2018  . TOTAL SHOULDER ARTHROPLASTY Right 03/10/2018   Procedure: RIGHT TOTAL SHOULDER ARTHROPLASTY;  Surgeon: [name], MD;  Location: [facility];  Service: Orthopedics;  Laterality: Right;  . TUBAL LIGATION      Family History  Problem Relation Age of Onset  . Melanoma Mother   . Heart failure Other   . Breast cancer Other   . Hypertension Other  Social History   Socioeconomic History  . Marital status: Divorced    Spouse name: Not on file  . Number of children: 2  . Years of education: 65  . Highest education level: Associate degree: academic program  Occupational History  . 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 Financial resource strain: Not hard at all  . Food insecurity    Worry: Never true    Inability: Never true  . Transportation needs    Medical: No    Non-medical: No  Tobacco Use  . Smoking status: Former Smoker    Packs/day: 0.00    Years: 0.00    Pack years: 0.00    Quit date: 08/24/1966    Years since quitting: 52.5  . Smokeless tobacco: Never Used  Substance and Sexual Activity  . Alcohol use: No  . Drug use: No  . Sexual activity: Not Currently  Lifestyle  . Physical activity    Days per week: 0 days    Minutes per session:  Not on file  . Stress: Not at all  Relationships  . Social Herbalist on phone: More than three times a week    Gets together: Never    Attends religious service: Never    Active member of club or organization: No    Attends meetings of clubs or organizations: Never    Relationship status: Divorced  . Intimate partner violence    Fear of current or ex partner: No    Emotionally abused: No    Physically abused: No    Forced sexual activity: No  Other Topics Concern  . Not on file  Social History Narrative   Retired. 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 Likes Systems developer and music. Loves animals.    Outpatient Medications Prior to Visit  Medication Sig Dispense Refill  . ACCU-CHEK FASTCLIX LANCETS MISC Test twice a day. Dx:E11.8 204 each 11  . alendronate (FOSAMAX) 70 MG tablet Take 1 tablet (70 mg total) by mouth every 7 (seven) days. Take with a full glass of water on an empty stomach. 12 tablet 4  . Blood Glucose Monitoring Suppl (ACCU-CHEK AVIVA PLUS) w/Device KIT For testing blood sugars twice daily. Dx: E11.8 1 kit 0  . bumetanide (BUMEX) 1 MG tablet TAKE 1 TABLET BY MOUTH TWO  TIMES DAILY 180 tablet 1  . Calcium Carb-Cholecalciferol (CALCIUM 500/D) 500-400 MG-UNIT CHEW Chew 600 mg by mouth 3 (three) times daily.     [name] dicyclomine (BENTYL) 20 MG tablet Take 10-20 mg by mouth every 8 (eight) hours as needed for spasms (cramps).   11  . diltiazem (CARDIZEM) 120 MG tablet TAKE 1 TABLET BY MOUTH  DAILY 90 tablet 3  . EPIPEN 2-PAK 0.3 MG/0.3ML SOAJ injection INJECT 1 PEN INTO MUSCLE OF LATERAL THIGH FOR SEVERE ALLERGIC REACTION. CALL 911 AFTER USING THIS MEDICATION.  2  . fluticasone (FLOVENT HFA) 110 MCG/ACT inhaler Inhale 2 puffs into the lungs 2 (two) times a day. 3 Inhaler 1  . glucose blood (ACCU-CHEK AVIVA PLUS) test strip For testing blood sugars twice daily. Dx: E11.8 200 each 4  . Insulin Pen Needle (BD PEN NEEDLE NANO U/F) 32G X 4 MM MISC Use once daily as  directed 200 each 6  . ketotifen (ZADITOR) 0.025 % ophthalmic solution Place 1 drop into both eyes 2 (two) times daily. 5 mL 1  . LANTUS SOLOSTAR 100 UNIT/ML Solostar Pen INJECT  SUBCUTANEOUSLY 40  UNITS AT BEDTIME 45 mL 4  . levalbuterol (XOPENEX) 1.25 MG/3ML nebulizer solution Take 1.25 mg by nebulization every 8 (eight) hours as needed for wheezing or shortness of breath.   2  . levothyroxine (SYNTHROID, LEVOTHROID) 75 MCG tablet TAKE 1 TABLET BY MOUTH  DAILY 90 tablet 1  . Multiple Vitamin (MULTIVITAMIN WITH MINERALS) TABS tablet Take 1 tablet by mouth daily.    . Multiple Vitamins-Minerals (OCUVITE EYE + MULTI) TABS Take 1 tablet by mouth daily.    . nebivolol (BYSTOLIC) 10 MG tablet Take 1 tablet (10 mg total) by mouth daily. 90 tablet 1  . pantoprazole (PROTONIX) 40 MG tablet Take 1 tablet by mouth.    . pravastatin (PRAVACHOL) 40 MG tablet Take 40 mg by mouth daily.    [name] PROAIR HFA 108 (90 Base) MCG/ACT inhaler Inhale 1-2 puffs into the lungs every 6 (six) hours as needed for wheezing or shortness of breath. To hold pt over until mail order arrives 8.5 g 0  . Probiotic Product (DIGESTIVE ADVANTAGE PO) Take by mouth.    . RESTASIS 0.05 % ophthalmic emulsion     . Magnesium 500 MG TABS Take 500 mg by mouth daily as needed (spasms).    . nystatin (MYCOSTATIN) 100000 UNIT/ML suspension Take 5 mLs (500,000 Units total) by mouth 4 (four) times daily. X 1 week. Swish and hold in mouth for at least 2 minutes and then swallow. 473 mL 0  . triamcinolone (KENALOG) 0.1 % paste Use as directed 1 application in the mouth or throat 2 (two) times daily. 5 g 11  . valACYclovir (VALTREX) 1000 MG tablet Take 1 tablet (1,000 mg total) by mouth 2 (two) times daily. 14 tablet 2   No facility-administered medications prior to visit.     Allergies  Allergen Reactions  . Penicillins Anaphylaxis    Has patient had a PCN reaction causing immediate rash, facial/tongue/throat swelling, SOB or lightheadedness with  hypotension: Yes Has patient had a PCN reaction causing severe rash involving mucus membranes or skin necrosis: No Has patient had a PCN reaction that required hospitalization: No Has patient had a PCN reaction occurring within the last 10 years: No If all of the above answers are "NO", then may proceed with Cephalosporin use.   . Naproxen Swelling  . Accupril [Quinapril Hcl] Other (See Comments)    Hyperkalemia  . Aspirin Other (See Comments)    Pt states "burns holes in stomach", ulcers   . Atorvastatin Other (See Comments)    msucle aches  . Baking Soda-Fluoride [Sodium Fluoride] Itching  . Celebrex [Celecoxib]     ulcers  . Codeine Nausea And Vomiting  . Fluticasone Other (See Comments)    Nasal ulcer.    . Furosemide Other (See Comments)    Nose bleed  . Lactose Intolerance (Gi) Other (See Comments)    Increased calcium, sneezing, nasal issues  . Levocetirizine     Doesn't recall  . Losartan Potassium Itching  . Metformin And Related Diarrhea  . Nasonex [Mometasone Furoate] Other (See Comments)    nosebleed  . Rifampin Other (See Comments)    DOES NOT REMEMBER THIS MEDICATION OR ALLERGY  . Wellbutrin [Bupropion] Other (See Comments)    Muscle aches, pain, "made my skin crawl"  . Clindamycin/Lincomycin Rash  . Iodinated Diagnostic Agents Itching and Rash    Pt states she has had ultrasound with dye but was fine  . Lanolin-Petrolatum Rash  . Miralax [Polyethylene Glycol] Rash  .  Quinine Rash  . Sulfa Antibiotics Rash  . Sulfonamide Derivatives Rash  . Tramadol Rash    Anxiety  . Wool Alcohol [Lanolin] Rash  . Zoledronic Acid Other (See Comments) and Rash    shaking    ROS     Objective:    Physical Exam  Constitutional: She is oriented to person, place, and time. She appears well-developed and well-nourished.  HENT:  Head: Normocephalic and atraumatic.  Cardiovascular: Normal rate, regular rhythm and normal heart sounds.  Pulmonary/Chest: Effort normal and  breath sounds normal.  Abdominal: Soft. Bowel sounds are normal. She exhibits no distension and no mass. There is abdominal tenderness. There is no rebound and no guarding.  Tender in the RLQ and the LLQ.    Genitourinary: Rectum:     Guaiac result negative.     Rectal mass, external hemorrhoid and internal hemorrhoid present.     No tenderness or abnormal anal tone.     Genitourinary Comments: She does have some external hemorrhoids but they are not inflamed.  On digital rectal exam no palpable mass or lesion normal sphincter tone.  Guaiac was negative.  Anoscope was used and did visualize some internal hemorrhoids as well as some pinpoint bleeding.   Neurological: She is alert and oriented to person, place, and time.  Skin: Skin is warm and dry.  Psychiatric: She has a normal mood and affect. Her behavior is normal.    BP (!) 124/56   Pulse 69   Ht _0  (1.575 m)   Wt 218 lb (98.9 kg)   SpO2 98%   BMI 39.87 kg/m  Wt Readings from Last 3 Encounters:  03/14/19 218 lb (98.9 kg)  02/14/19 220 lb (99.8 kg)  02/14/19 220 lb (99.8 kg)    There are no preventive care reminders to display for this patient.  There are no preventive care reminders to display for this patient.   Lab Results  Component Value Date   TSH 2.09 05/24/2018   Lab Results  Component Value Date   WBC 7.8 12/28/2018   HGB 15.0 12/28/2018   HCT 45.2 (H) 12/28/2018   MCV 92.6 12/28/2018   PLT 357 12/28/2018   Lab Results  Component Value Date   NA 139 12/28/2018   K 4.9 12/28/2018   CHLORIDE 105 10/30/2014   CO2 27 12/28/2018   GLUCOSE 141 (H) 12/28/2018   BUN 16 12/28/2018   CREATININE 0.80 12/28/2018   BILITOT 0.6 12/28/2018   ALKPHOS 100 03/02/2018   AST 26 12/28/2018   ALT 22 12/28/2018   PROT 7.4 12/28/2018   ALBUMIN 4.0 03/02/2018   CALCIUM 10.1 12/28/2018   ANIONGAP 8 03/11/2018   EGFR 78 (L) 10/30/2014   Lab Results  Component Value Date   CHOL 165 02/22/2018   Lab Results   Component Value Date   HDL 65 02/22/2018   Lab Results  Component Value Date   LDLCALC 77 02/22/2018   Lab Results  Component Value Date   TRIG 130 02/22/2018   Lab Results  Component Value Date   CHOLHDL 2.5 02/22/2018   Lab Results  Component Value Date   HGBA1C 6.5 (A) 12/28/2018       Assessment & Plan:   Problem List Items Addressed This Visit    None    Visit Diagnoses    Rectal bleeding    -  Primary   Relevant Orders   COMPLETE METABOLIC PANEL WITH GFR   CBC with Differential/Platelet  Lower abdominal pain       Relevant Orders   CT Abdomen Pelvis Wo Contrast   COMPLETE METABOLIC PANEL WITH GFR   CBC with Differential/Platelet     Blood in stool/lower abdominal pain-unclear etiology-possible diverticulitis.  She seems to be in significant discomfort today so we will get this scheduled ASAP for further work-up.  In meantime we will also check a CBC with differential.  No fevers or chills or sweats.  Did do Hemoccult and it was negative today though she did have pinpoint bleeding on rectal exam with the anoscope.  No orders of the defined types were placed in this encounter.    Beatrice Lecher, MD

## 2019-03-15 LAB — CBC WITH DIFFERENTIAL/PLATELET
Absolute Monocytes: 462 cells/uL (ref 200–950)
Basophils Absolute: 52 cells/uL (ref 0–200)
Basophils Relative: 0.8 %
Eosinophils Absolute: 91 cells/uL (ref 15–500)
Eosinophils Relative: 1.4 %
HCT: 42.7 % (ref 35.0–45.0)
Hemoglobin: 14.1 g/dL (ref 11.7–15.5)
Lymphs Abs: 2288 cells/uL (ref 850–3900)
MCH: 30.4 pg (ref 27.0–33.0)
MCHC: 33 g/dL (ref 32.0–36.0)
MCV: 92 fL (ref 80.0–100.0)
MPV: 10.7 fL (ref 7.5–12.5)
Monocytes Relative: 7.1 %
Neutro Abs: 3608 cells/uL (ref 1500–7800)
Neutrophils Relative %: 55.5 %
Platelets: 287 10*3/uL (ref 140–400)
RBC: 4.64 10*6/uL (ref 3.80–5.10)
RDW: 12.1 % (ref 11.0–15.0)
Total Lymphocyte: 35.2 %
WBC: 6.5 10*3/uL (ref 3.8–10.8)

## 2019-03-15 LAB — COMPLETE METABOLIC PANEL WITH GFR
AG Ratio: 1.8 (calc) (ref 1.0–2.5)
ALT: 21 U/L (ref 6–29)
AST: 24 U/L (ref 10–35)
Albumin: 4.4 g/dL (ref 3.6–5.1)
Alkaline phosphatase (APISO): 76 U/L (ref 37–153)
BUN: 12 mg/dL (ref 7–25)
CO2: 29 mmol/L (ref 20–32)
Calcium: 10.5 mg/dL — ABNORMAL HIGH (ref 8.6–10.4)
Chloride: 103 mmol/L (ref 98–110)
Creat: 0.82 mg/dL (ref 0.50–0.99)
GFR, Est African American: 85 mL/min/{1.73_m2} (ref >=60)
GFR, Est Non African American: 73 mL/min/{1.73_m2} (ref >=60)
Globulin: 2.5 g/dL (calc) (ref 1.9–3.7)
Glucose, Bld: 111 mg/dL — ABNORMAL HIGH (ref 65–99)
Potassium: 4.6 mmol/L (ref 3.5–5.3)
Sodium: 144 mmol/L (ref 135–146)
Total Bilirubin: 0.5 mg/dL (ref 0.2–1.2)
Total Protein: 6.9 g/dL (ref 6.1–8.1)

## 2019-03-15 NOTE — Progress Notes (Signed)
All labs are normal. 

## 2019-03-16 ENCOUNTER — Other Ambulatory Visit: Payer: Self-pay | Admitting: *Deleted

## 2019-03-16 DIAGNOSIS — K625 Hemorrhage of anus and rectum: Secondary | ICD-10-CM

## 2019-03-16 NOTE — Progress Notes (Signed)
Pended the referral for GI not sure about the Dx code.Marland KitchenMarland KitchenElouise Munroe, [place]

## 2019-03-27 DIAGNOSIS — K740 Hepatic fibrosis: Secondary | ICD-10-CM | POA: Diagnosis not present

## 2019-03-27 DIAGNOSIS — Z86010 Personal history of colonic polyps: Secondary | ICD-10-CM | POA: Diagnosis not present

## 2019-03-27 DIAGNOSIS — K5900 Constipation, unspecified: Secondary | ICD-10-CM | POA: Diagnosis not present

## 2019-03-27 DIAGNOSIS — K7581 Nonalcoholic steatohepatitis (NASH): Secondary | ICD-10-CM | POA: Diagnosis not present

## 2019-03-27 DIAGNOSIS — K921 Melena: Secondary | ICD-10-CM | POA: Diagnosis not present

## 2019-03-30 ENCOUNTER — Ambulatory Visit: Payer: Medicare Other | Admitting: Family Medicine

## 2019-04-03 ENCOUNTER — Ambulatory Visit (INDEPENDENT_AMBULATORY_CARE_PROVIDER_SITE_OTHER): Payer: Medicare Other | Admitting: *Deleted

## 2019-04-03 DIAGNOSIS — I442 Atrioventricular block, complete: Secondary | ICD-10-CM

## 2019-04-03 DIAGNOSIS — D126 Benign neoplasm of colon, unspecified: Secondary | ICD-10-CM | POA: Diagnosis not present

## 2019-04-03 DIAGNOSIS — Z01812 Encounter for preprocedural laboratory examination: Secondary | ICD-10-CM | POA: Diagnosis not present

## 2019-04-03 LAB — CUP PACEART REMOTE DEVICE CHECK
Battery Remaining Longevity: 39 mo
Battery Voltage: 2.97 V
Brady Statistic AP VP Percent: 51.5 %
Brady Statistic AP VS Percent: 0.06 %
Brady Statistic AS VP Percent: 47.69 %
Brady Statistic AS VS Percent: 0.75 %
Brady Statistic RA Percent Paced: 49.78 %
Brady Statistic RV Percent Paced: 96.65 %
Date Time Interrogation Session: 20200810190436
Implantable Lead Implant Date: 20030324
Implantable Lead Implant Date: 20030324
Implantable Lead Implant Date: 20030324
Implantable Lead Location: 753858
Implantable Lead Location: 753859
Implantable Lead Location: 753860
Implantable Lead Model: 4068
Implantable Lead Model: 4068
Implantable Lead Model: 4193
Implantable Pulse Generator Implant Date: 20151231
Lead Channel Impedance Value: 1026 Ohm
Lead Channel Impedance Value: 1197 Ohm
Lead Channel Impedance Value: 4047 Ohm
Lead Channel Impedance Value: 4047 Ohm
Lead Channel Impedance Value: 4047 Ohm
Lead Channel Impedance Value: 456 Ohm
Lead Channel Impedance Value: 475 Ohm
Lead Channel Impedance Value: 475 Ohm
Lead Channel Impedance Value: 608 Ohm
Lead Channel Pacing Threshold Amplitude: 1 V
Lead Channel Pacing Threshold Amplitude: 2.125 V
Lead Channel Pacing Threshold Pulse Width: 0.4 ms
Lead Channel Pacing Threshold Pulse Width: 0.4 ms
Lead Channel Sensing Intrinsic Amplitude: 1.625 mV
Lead Channel Sensing Intrinsic Amplitude: 1.625 mV
Lead Channel Sensing Intrinsic Amplitude: 7 mV
Lead Channel Sensing Intrinsic Amplitude: 7 mV
Lead Channel Setting Pacing Amplitude: 1.5 V
Lead Channel Setting Pacing Amplitude: 2.5 V
Lead Channel Setting Pacing Amplitude: 3.5 V
Lead Channel Setting Pacing Pulse Width: 0.8 ms
Lead Channel Setting Pacing Pulse Width: 0.8 ms
Lead Channel Setting Sensing Sensitivity: 4 mV

## 2019-04-06 ENCOUNTER — Ambulatory Visit: Payer: Medicare Other | Admitting: Family Medicine

## 2019-04-07 DIAGNOSIS — Z881 Allergy status to other antibiotic agents status: Secondary | ICD-10-CM | POA: Diagnosis not present

## 2019-04-07 DIAGNOSIS — Z880 Allergy status to penicillin: Secondary | ICD-10-CM | POA: Diagnosis not present

## 2019-04-07 DIAGNOSIS — Z950 Presence of cardiac pacemaker: Secondary | ICD-10-CM | POA: Diagnosis not present

## 2019-04-07 DIAGNOSIS — K5790 Diverticulosis of intestine, part unspecified, without perforation or abscess without bleeding: Secondary | ICD-10-CM | POA: Diagnosis not present

## 2019-04-07 DIAGNOSIS — K635 Polyp of colon: Secondary | ICD-10-CM | POA: Diagnosis not present

## 2019-04-07 DIAGNOSIS — G4733 Obstructive sleep apnea (adult) (pediatric): Secondary | ICD-10-CM | POA: Diagnosis not present

## 2019-04-07 DIAGNOSIS — K219 Gastro-esophageal reflux disease without esophagitis: Secondary | ICD-10-CM | POA: Diagnosis not present

## 2019-04-07 DIAGNOSIS — Z79899 Other long term (current) drug therapy: Secondary | ICD-10-CM | POA: Diagnosis not present

## 2019-04-07 DIAGNOSIS — Z91041 Radiographic dye allergy status: Secondary | ICD-10-CM | POA: Diagnosis not present

## 2019-04-07 DIAGNOSIS — Z794 Long term (current) use of insulin: Secondary | ICD-10-CM | POA: Diagnosis not present

## 2019-04-07 DIAGNOSIS — D125 Benign neoplasm of sigmoid colon: Secondary | ICD-10-CM | POA: Diagnosis not present

## 2019-04-07 DIAGNOSIS — Z87891 Personal history of nicotine dependence: Secondary | ICD-10-CM | POA: Diagnosis not present

## 2019-04-07 DIAGNOSIS — Z882 Allergy status to sulfonamides status: Secondary | ICD-10-CM | POA: Diagnosis not present

## 2019-04-07 DIAGNOSIS — E119 Type 2 diabetes mellitus without complications: Secondary | ICD-10-CM | POA: Diagnosis not present

## 2019-04-07 DIAGNOSIS — D123 Benign neoplasm of transverse colon: Secondary | ICD-10-CM | POA: Diagnosis not present

## 2019-04-07 DIAGNOSIS — E039 Hypothyroidism, unspecified: Secondary | ICD-10-CM | POA: Diagnosis not present

## 2019-04-07 DIAGNOSIS — J449 Chronic obstructive pulmonary disease, unspecified: Secondary | ICD-10-CM | POA: Diagnosis not present

## 2019-04-07 DIAGNOSIS — K621 Rectal polyp: Secondary | ICD-10-CM | POA: Diagnosis not present

## 2019-04-07 DIAGNOSIS — Z7951 Long term (current) use of inhaled steroids: Secondary | ICD-10-CM | POA: Diagnosis not present

## 2019-04-07 DIAGNOSIS — Z888 Allergy status to other drugs, medicaments and biological substances status: Secondary | ICD-10-CM | POA: Diagnosis not present

## 2019-04-07 DIAGNOSIS — I10 Essential (primary) hypertension: Secondary | ICD-10-CM | POA: Diagnosis not present

## 2019-04-11 ENCOUNTER — Telehealth: Payer: Self-pay

## 2019-04-11 ENCOUNTER — Ambulatory Visit (INDEPENDENT_AMBULATORY_CARE_PROVIDER_SITE_OTHER): Payer: Medicare Other | Admitting: Family Medicine

## 2019-04-11 ENCOUNTER — Other Ambulatory Visit: Payer: Self-pay

## 2019-04-11 ENCOUNTER — Encounter: Payer: Self-pay | Admitting: Cardiology

## 2019-04-11 ENCOUNTER — Encounter: Payer: Self-pay | Admitting: Family Medicine

## 2019-04-11 VITALS — BP 131/79 | HR 103 | Temp 98.2°F | Ht 62.0 in | Wt 220.0 lb

## 2019-04-11 DIAGNOSIS — M2550 Pain in unspecified joint: Secondary | ICD-10-CM | POA: Diagnosis not present

## 2019-04-11 DIAGNOSIS — M2540 Effusion, unspecified joint: Secondary | ICD-10-CM

## 2019-04-11 DIAGNOSIS — Z23 Encounter for immunization: Secondary | ICD-10-CM

## 2019-04-11 DIAGNOSIS — E118 Type 2 diabetes mellitus with unspecified complications: Secondary | ICD-10-CM

## 2019-04-11 DIAGNOSIS — E038 Other specified hypothyroidism: Secondary | ICD-10-CM

## 2019-04-11 LAB — POCT GLYCOSYLATED HEMOGLOBIN (HGB A1C): Hemoglobin A1C: 6.5 % — AB (ref 4.0–5.6)

## 2019-04-11 MED ORDER — FLUTICASONE-SALMETEROL 250-50 MCG/DOSE IN AEPB: 1.0000 | INHALATION_SPRAY | Freq: Two times a day (BID) | RESPIRATORY_TRACT | 3 refills | Status: DC

## 2019-04-11 NOTE — Progress Notes (Signed)
Established Patient Office Visit  Subjective:  Patient ID: Tanya Hogan, female    DOB: 04/30/1951  Age: 68 y.o. MRN: [id number]  CC:  Chief Complaint  Patient presents with  . Diabetes    HPI Tanya Hogan presents for   Diabetes - no hypoglycemic events. No wounds or sores that are not healing well. No increased thirst or urination. Checking glucose at home. Taking medications as prescribed without any side effects. Last time here we reecommended to increase long-acting insulin to 42 units for 1 week and if blood sugars are still running above 130 then increase to 44 units.  She says she was having some lows in the evening before she would go to bed sometimes in the 120s and so decided to stay with 40 units.  She did bring in her glucose log for [place] to review.  She has some great numbers but some elevated numbers.  She says today she just does not feel good.  She feels like her gut is "shaky".  She denies any bowel pain or spasms or diarrhea.  She says she just does not feel good because she has not had her coffee in her Tylenol yet.  She also complains of significant joint pain.  She has been waking up with her back and neck and joints and her extremities hurting.  She says she will usually take a Tylenol in the morning and will get some relief after that kicks in.  Also been having hot flashes and just feeling hot all over.  She did see the plastic surgeon for consultation for breast surgery.  She says that they have been able to get it approved through her insurance.  Past Medical History:  Diagnosis Date  . Alkaline phosphatase elevation   . Asthma   . Breast cancer ([place]) 2011  . Carpal tunnel syndrome   . Carpal tunnel syndrome    bilateral  . CHF (congestive heart failure) ([place])   . Cirrhosis, non-alcoholic ([place])   . Closed fracture of unspecified part of radius (alone)   . DDD (degenerative disc disease), lumbar   . Depression    pt denies  . Diabetes mellitus  without complication ([place])   . Enlarged heart   . Fibromyalgia   . GERD (gastroesophageal reflux disease)   . History of kidney stones   . HTN (hypertension)   . Hypothyroidism   . IBS (irritable bowel syndrome)   . LBBB (left bundle branch block)   . Microcytic anemia 05/14/2017  . Pneumonia   . Presence of permanent cardiac pacemaker   . PVD (peripheral vascular disease) ([place])    2019  . Sleep apnea    uses cpap    Past Surgical History:  Procedure Laterality Date  . BREAST LUMPECTOMY Left 2011  . COLONOSCOPY    . FOOT SURGERY    . KNEE SURGERY    . PACEMAKER GENERATOR CHANGE N/A 08/23/2014   Procedure: PACEMAKER GENERATOR CHANGE;  Surgeon: Evans Lance, MD;  Location: [facility] CATH LAB;  Service: Cardiovascular;  Laterality: N/A;  . PACEMAKER PLACEMENT  2009   [facility]  . RECTAL SURGERY  1976  . TOTAL SHOULDER ARTHROPLASTY Right 03/10/2018  . TOTAL SHOULDER ARTHROPLASTY Right 03/10/2018   Procedure: RIGHT TOTAL SHOULDER ARTHROPLASTY;  Surgeon: [name], MD;  Location: [facility];  Service: Orthopedics;  Laterality: Right;  . TUBAL LIGATION      Family History  Problem Relation Age of Onset  . Melanoma Mother   .  Parkinson's disease Mother   . Heart failure Other   . Breast cancer Other   . Hypertension Other     Social History   Socioeconomic History  . Marital status: Divorced    Spouse name: Not on file  . Number of children: 2  . Years of education: 32  . Highest education level: Associate degree: academic program  Occupational History  . 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 Financial resource strain: Not hard at all  . Food insecurity    Worry: Never true    Inability: Never true  . Transportation needs    Medical: No    Non-medical: No  Tobacco Use  . Smoking status: Former Smoker    Packs/day: 0.00    Years: 0.00    Pack years: 0.00    Quit date: 08/24/1966    Years since quitting:  52.6  . Smokeless tobacco: Never Used  Substance and Sexual Activity  . Alcohol use: No  . Drug use: No  . Sexual activity: Not Currently  Lifestyle  . Physical activity    Days per week: 0 days    Minutes per session: Not on file  . Stress: Not at all  Relationships  . Social Herbalist on phone: More than three times a week    Gets together: Never    Attends religious service: Never    Active member of club or organization: No    Attends meetings of clubs or organizations: Never    Relationship status: Divorced  . Intimate partner violence    Fear of current or ex partner: No    Emotionally abused: No    Physically abused: No    Forced sexual activity: No  Other Topics Concern  . Not on file  Social History Narrative   Retired. 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 Likes Systems developer and music. Loves animals.    Outpatient Medications Prior to Visit  Medication Sig Dispense Refill  . ACCU-CHEK FASTCLIX LANCETS MISC Test twice a day. Dx:E11.8 204 each 11  . alendronate (FOSAMAX) 70 MG tablet Take 1 tablet (70 mg total) by mouth every 7 (seven) days. Take with a full glass of water on an empty stomach. 12 tablet 4  . Blood Glucose Monitoring Suppl (ACCU-CHEK AVIVA PLUS) w/Device KIT For testing blood sugars twice daily. Dx: E11.8 1 kit 0  . bumetanide (BUMEX) 1 MG tablet TAKE 1 TABLET BY MOUTH TWO  TIMES DAILY 180 tablet 1  . Calcium Carb-Cholecalciferol (CALCIUM 500/D) 500-400 MG-UNIT CHEW Chew 600 mg by mouth 3 (three) times daily.     [name] dicyclomine (BENTYL) 20 MG tablet Take 10-20 mg by mouth every 8 (eight) hours as needed for spasms (cramps).   11  . diltiazem (CARDIZEM) 120 MG tablet TAKE 1 TABLET BY MOUTH  DAILY 90 tablet 3  . EPIPEN 2-PAK 0.3 MG/0.3ML SOAJ injection INJECT 1 PEN INTO MUSCLE OF LATERAL THIGH FOR SEVERE ALLERGIC REACTION. CALL 911 AFTER USING THIS MEDICATION.  2  . fluticasone (FLOVENT HFA) 110 MCG/ACT inhaler Inhale 2 puffs into the  lungs 2 (two) times a day. 3 Inhaler 1  . glucose blood (ACCU-CHEK AVIVA PLUS) test strip For testing blood sugars twice daily. Dx: E11.8 200 each 4  . Insulin Pen Needle (BD PEN NEEDLE NANO U/F) 32G X 4 MM MISC Use once daily as directed 200 each 6  . ketotifen (ZADITOR) 0.025 %  ophthalmic solution Place 1 drop into both eyes 2 (two) times daily. 5 mL 1  . LANTUS SOLOSTAR 100 UNIT/ML Solostar Pen INJECT SUBCUTANEOUSLY 40  UNITS AT BEDTIME 45 mL 4  . levalbuterol (XOPENEX) 1.25 MG/3ML nebulizer solution Take 1.25 mg by nebulization every 8 (eight) hours as needed for wheezing or shortness of breath.   2  . levothyroxine (SYNTHROID, LEVOTHROID) 75 MCG tablet TAKE 1 TABLET BY MOUTH  DAILY 90 tablet 1  . Multiple Vitamins-Minerals (OCUVITE EYE + MULTI) TABS Take 1 tablet by mouth daily.    . nebivolol (BYSTOLIC) 10 MG tablet Take 1 tablet (10 mg total) by mouth daily. 90 tablet 1  . pravastatin (PRAVACHOL) 40 MG tablet Take 40 mg by mouth daily.    [name] PROAIR HFA 108 (90 Base) MCG/ACT inhaler Inhale 1-2 puffs into the lungs every 6 (six) hours as needed for wheezing or shortness of breath. To hold pt over until mail order arrives 8.5 g 0  . RESTASIS 0.05 % ophthalmic emulsion     . Multiple Vitamin (MULTIVITAMIN WITH MINERALS) TABS tablet Take 1 tablet by mouth daily.    . pantoprazole (PROTONIX) 40 MG tablet Take 1 tablet by mouth.    . Probiotic Product (DIGESTIVE ADVANTAGE PO) Take by mouth.     No facility-administered medications prior to visit.     Allergies  Allergen Reactions  . Penicillins Anaphylaxis    Has patient had a PCN reaction causing immediate rash, facial/tongue/throat swelling, SOB or lightheadedness with hypotension: Yes Has patient had a PCN reaction causing severe rash involving mucus membranes or skin necrosis: No Has patient had a PCN reaction that required hospitalization: No Has patient had a PCN reaction occurring within the last 10 years: No If all of the above  answers are "NO", then may proceed with Cephalosporin use.   . Naproxen Swelling  . Accupril [Quinapril Hcl] Other (See Comments)    Hyperkalemia  . Aspirin Other (See Comments)    Pt states "burns holes in stomach", ulcers   . Atorvastatin Other (See Comments)    msucle aches  . Baking Soda-Fluoride [Sodium Fluoride] Itching  . Celebrex [Celecoxib]     ulcers  . Codeine Nausea And Vomiting  . Fluticasone Other (See Comments)    Nasal ulcer.    . Furosemide Other (See Comments)    Nose bleed  . Lactose   . Lactose Intolerance (Gi) Other (See Comments)    Increased calcium, sneezing, nasal issues  . Levocetirizine     Doesn't recall  . Losartan Potassium Itching  . Metformin And Related Diarrhea  . Miralax  [Polyethylene Glycol 3350]   . Nasonex  [Mometasone Furoate]   . Nasonex [Mometasone Furoate] Other (See Comments)    nosebleed  . Rifampin Other (See Comments)    DOES NOT REMEMBER THIS MEDICATION OR ALLERGY  . Wellbutrin [Bupropion] Other (See Comments)    Muscle aches, pain, "made my skin crawl"  . Clindamycin/Lincomycin Rash  . Iodinated Diagnostic Agents Itching and Rash    Pt states she has had ultrasound with dye but was fine  . Lanolin-Petrolatum Rash  . Miralax [Polyethylene Glycol] Rash  . Quinine Rash  . Sulfa Antibiotics Rash  . Sulfonamide Derivatives Rash  . Tramadol Rash    Anxiety  . Wool Alcohol [Lanolin] Rash  . Zoledronic Acid Other (See Comments) and Rash    shaking    ROS Review of Systems    Objective:    Physical Exam  Constitutional: She is oriented to person, place, and time. She appears well-developed and well-nourished.  HENT:  Head: Normocephalic and atraumatic.  Cardiovascular: Normal rate, regular rhythm and normal heart sounds.  Pulmonary/Chest: Effort normal and breath sounds normal.  Neurological: She is alert and oriented to person, place, and time.  Skin: Skin is warm and dry.  Psychiatric: She has a normal mood and  affect. Her behavior is normal.    BP 131/79   Pulse (!) 103   Temp 98.2 F (36.8 C)   Ht 5' 2"  (1.575 m)   Wt 220 lb (99.8 kg)   SpO2 96%   BMI 40.24 kg/m  Wt Readings from Last 3 Encounters:  04/11/19 220 lb (99.8 kg)  03/14/19 218 lb (98.9 kg)  02/14/19 220 lb (99.8 kg)     Health Maintenance Due  Topic Date Due  . INFLUENZA VACCINE  03/25/2019  . OPHTHALMOLOGY EXAM  04/08/2019    There are no preventive care reminders to display for this patient.  Lab Results  Component Value Date   TSH 2.09 05/24/2018   Lab Results  Component Value Date   WBC 6.5 03/14/2019   HGB 14.1 03/14/2019   HCT 42.7 03/14/2019   MCV 92.0 03/14/2019   PLT 287 03/14/2019   Lab Results  Component Value Date   NA 144 03/14/2019   K 4.6 03/14/2019   CHLORIDE 105 10/30/2014   CO2 29 03/14/2019   GLUCOSE 111 (H) 03/14/2019   BUN 12 03/14/2019   CREATININE 0.82 03/14/2019   BILITOT 0.5 03/14/2019   ALKPHOS 100 03/02/2018   AST 24 03/14/2019   ALT 21 03/14/2019   PROT 6.9 03/14/2019   ALBUMIN 4.0 03/02/2018   CALCIUM 10.5 (H) 03/14/2019   ANIONGAP 8 03/11/2018   EGFR 78 (L) 10/30/2014   Lab Results  Component Value Date   CHOL 165 02/22/2018   Lab Results  Component Value Date   HDL 65 02/22/2018   Lab Results  Component Value Date   LDLCALC 77 02/22/2018   Lab Results  Component Value Date   TRIG 130 02/22/2018   Lab Results  Component Value Date   CHOLHDL 2.5 02/22/2018   Lab Results  Component Value Date   HGBA1C 6.5 (A) 04/11/2019      Assessment & Plan:   Problem List Items Addressed This Visit      Endocrine   Hypothyroidism    Due to recheck thyroid level today.  Will make adjustments based on results.  But it sounds like she is fairly asymptomatic.      Relevant Orders   TSH   Controlled diabetes mellitus type 2 with complications ([place]) - Primary    Even that her sugars are up and down overall her A1c looks great.  Just that 120 is actually a  normal blood sugar and its not a low number.  Though she may feel a little shaky when that happens but it is actually a great number to have.  Also reminded her that if she takes her insulin at night it does not really peak for about another 12 hours.  She would also like referral to podiatry to have her nails cut because of her joint pain she is having a harder and hard time getting to her feet to be able to do nail care.      Relevant Orders   POCT glycosylated hemoglobin (Hb A1C) (Completed)   Uric acid   TSH   Sedimentation rate  ANA   Cyclic citrul peptide antibody, IgG   Rheumatoid factor   COMPLETE METABOLIC PANEL WITH GFR   Lipid panel   Ambulatory referral to Podiatry     Other   Arthralgia    Will evaluate for other autoimmune types of arthritis but I suspect it is probably just osteoarthritis.  Continue with Tylenol as needed recommend a trial of Tylenol arthritis which lasts a little bit longer.  We will also check uric acid level to rule out gout since she does notice some joint swelling at times.      Relevant Orders   Uric acid   TSH   Sedimentation rate   ANA   Cyclic citrul peptide antibody, IgG   Rheumatoid factor   COMPLETE METABOLIC PANEL WITH GFR   Lipid panel    Other Visit Diagnoses    Joint swelling       Relevant Orders   Uric acid   TSH   Sedimentation rate   ANA   Cyclic citrul peptide antibody, IgG   Rheumatoid factor   COMPLETE METABOLIC PANEL WITH GFR   Lipid panel      No orders of the defined types were placed in this encounter.   Follow-up: Return in about 3 months (around 07/12/2019) for Diabetes follow-up.    Beatrice Lecher, MD

## 2019-04-11 NOTE — Progress Notes (Signed)
Remote pacemaker transmission.   

## 2019-04-11 NOTE — Telephone Encounter (Signed)
Tanya Hogan called stating that she was told by Dr Madilyn Fireman that she could have an inhaler called in that was a combo of her Flovent and another inhaler that she wasn't sure the name of.  Do you know what pt is referring to?  She wants sent to mail order.   Thanks!

## 2019-04-11 NOTE — Assessment & Plan Note (Signed)
Will evaluate for other autoimmune types of arthritis but I suspect it is probably just osteoarthritis.  Continue with Tylenol as needed recommend a trial of Tylenol arthritis which lasts a little bit longer.  We will also check uric acid level to rule out gout since she does notice some joint swelling at times.

## 2019-04-11 NOTE — Telephone Encounter (Signed)
Okay, prescription sent for generic Advair.  That already has inhaled corticosteroid like the Flovent already in it and long-acting albuterol so she does not have to use her regular albuterol nearly as often.

## 2019-04-11 NOTE — Assessment & Plan Note (Addendum)
Even that her sugars are up and down overall her A1c looks great.  Just that 120 is actually a normal blood sugar and its not a low number.  Though she may feel a little shaky when that happens but it is actually a great number to have.  Also reminded her that if she takes her insulin at night it does not really peak for about another 12 hours.  She would also like referral to podiatry to have her nails cut because of her joint pain she is having a harder and hard time getting to her feet to be able to do nail care.

## 2019-04-11 NOTE — Assessment & Plan Note (Signed)
Due to recheck thyroid level today.  Will make adjustments based on results.  But it sounds like she is fairly asymptomatic.

## 2019-04-12 MED ORDER — FLUTICASONE-SALMETEROL 250-50 MCG/DOSE IN AEPB: 1.0000 | INHALATION_SPRAY | Freq: Two times a day (BID) | RESPIRATORY_TRACT | 0 refills | Status: DC

## 2019-04-12 NOTE — Telephone Encounter (Signed)
Patient advised. Wanted RX to go to mail order but jsut leave 1 RF at local while she waits.   Called [facility] and spoke with pharmacist Altha Harm and cancelled the refills, patient will just be getting 1 inhaler from there.   I send RX to mail order for 90 day supply of inhaler with no refills

## 2019-04-13 DIAGNOSIS — D3131 Benign neoplasm of right choroid: Secondary | ICD-10-CM | POA: Diagnosis not present

## 2019-04-13 DIAGNOSIS — H04123 Dry eye syndrome of bilateral lacrimal glands: Secondary | ICD-10-CM | POA: Diagnosis not present

## 2019-04-13 LAB — COMPLETE METABOLIC PANEL WITH GFR
AG Ratio: 1.6 (calc) (ref 1.0–2.5)
ALT: 19 U/L (ref 6–29)
AST: 20 U/L (ref 10–35)
Albumin: 4.3 g/dL (ref 3.6–5.1)
Alkaline phosphatase (APISO): 88 U/L (ref 37–153)
BUN: 11 mg/dL (ref 7–25)
CO2: 29 mmol/L (ref 20–32)
Calcium: 10.5 mg/dL — ABNORMAL HIGH (ref 8.6–10.4)
Chloride: 104 mmol/L (ref 98–110)
Creat: 0.8 mg/dL (ref 0.50–0.99)
GFR, Est African American: 87 mL/min/{1.73_m2} (ref >=60)
GFR, Est Non African American: 75 mL/min/{1.73_m2} (ref >=60)
Globulin: 2.7 g/dL (calc) (ref 1.9–3.7)
Glucose, Bld: 139 mg/dL — ABNORMAL HIGH (ref 65–99)
Potassium: 4.1 mmol/L (ref 3.5–5.3)
Sodium: 141 mmol/L (ref 135–146)
Total Bilirubin: 0.5 mg/dL (ref 0.2–1.2)
Total Protein: 7 g/dL (ref 6.1–8.1)

## 2019-04-13 LAB — LIPID PANEL
Cholesterol: 164 mg/dL (ref <200)
HDL: 68 mg/dL (ref >=50)
LDL Cholesterol (Calc): 76 mg/dL (calc)
Non-HDL Cholesterol (Calc): 96 mg/dL (calc) (ref <130)
Total CHOL/HDL Ratio: 2.4 (calc) (ref <5.0)
Triglycerides: 121 mg/dL (ref <150)

## 2019-04-13 LAB — TSH: TSH: 2.44 mIU/L (ref 0.40–4.50)

## 2019-04-13 LAB — SEDIMENTATION RATE: Sed Rate: 22 mm/h (ref 0–30)

## 2019-04-13 LAB — RHEUMATOID FACTOR: Rheumatoid fact SerPl-aCnc: <14 IU/mL (ref <14)

## 2019-04-13 LAB — URIC ACID: Uric Acid, Serum: 6.6 mg/dL (ref 2.5–7.0)

## 2019-04-13 LAB — ANA: Anti Nuclear Antibody (ANA): NEGATIVE

## 2019-04-13 LAB — CYCLIC CITRUL PEPTIDE ANTIBODY, IGG: Cyclic Citrullin Peptide Ab: <16 UNITS

## 2019-04-14 ENCOUNTER — Telehealth: Payer: Self-pay

## 2019-04-14 NOTE — Telephone Encounter (Signed)
Patient advised.

## 2019-04-14 NOTE — Telephone Encounter (Signed)
Tanya Hogan called wanted to confirm she should take the Advair, Flovent, Proair as needed, xopenex as needed and Spiriva. Please advise.

## 2019-04-14 NOTE — Telephone Encounter (Signed)
She needs to use the Advair and Spiriva daily. Throw away the Flovent.  The Proair and Xopenex are rescue inhalers so just needs to use those when SOB or wheezing.

## 2019-04-17 ENCOUNTER — Telehealth: Payer: Self-pay

## 2019-04-17 NOTE — Telephone Encounter (Signed)
Patient advised.

## 2019-04-17 NOTE — Telephone Encounter (Signed)
Tanya Hogan called and left a message asking if Dr [name] would write a letter stating she has trouble walking to the mail box. She would like her mail delivered to her door. Please advise.

## 2019-04-17 NOTE — Telephone Encounter (Signed)
Letter written, can be printed from mychart.

## 2019-04-18 ENCOUNTER — Telehealth: Payer: Self-pay | Admitting: Family Medicine

## 2019-04-18 NOTE — Telephone Encounter (Signed)
Tanya Hogan, can you update this referral please?

## 2019-04-18 NOTE — Telephone Encounter (Signed)
Patient is requesting to be sent to Dr. Mallie Mussel on [address] in [place] instead of being sent to [facility] in [place].

## 2019-04-20 NOTE — Telephone Encounter (Signed)
Printed referral out and gave to [facility]

## 2019-04-27 DIAGNOSIS — G4737 Central sleep apnea in conditions classified elsewhere: Secondary | ICD-10-CM | POA: Diagnosis not present

## 2019-04-27 DIAGNOSIS — G4733 Obstructive sleep apnea (adult) (pediatric): Secondary | ICD-10-CM | POA: Diagnosis not present

## 2019-04-28 ENCOUNTER — Other Ambulatory Visit: Payer: Self-pay | Admitting: *Deleted

## 2019-04-28 MED ORDER — BUMETANIDE 1 MG PO TABS: 1.0000 mg | ORAL_TABLET | Freq: Two times a day (BID) | ORAL | 1 refills | Status: DC

## 2019-04-28 MED ORDER — LEVOTHYROXINE SODIUM 75 MCG PO TABS: 75.0000 ug | ORAL_TABLET | Freq: Every day | ORAL | 1 refills | Status: DC

## 2019-05-12 DIAGNOSIS — M79672 Pain in left foot: Secondary | ICD-10-CM | POA: Diagnosis not present

## 2019-05-12 DIAGNOSIS — M79671 Pain in right foot: Secondary | ICD-10-CM | POA: Diagnosis not present

## 2019-05-12 DIAGNOSIS — L602 Onychogryphosis: Secondary | ICD-10-CM | POA: Diagnosis not present

## 2019-05-12 DIAGNOSIS — E119 Type 2 diabetes mellitus without complications: Secondary | ICD-10-CM | POA: Diagnosis not present

## 2019-05-12 DIAGNOSIS — L84 Corns and callosities: Secondary | ICD-10-CM | POA: Diagnosis not present

## 2019-05-15 ENCOUNTER — Other Ambulatory Visit: Payer: Self-pay | Admitting: Family Medicine

## 2019-05-15 DIAGNOSIS — R262 Difficulty in walking, not elsewhere classified: Secondary | ICD-10-CM | POA: Diagnosis not present

## 2019-05-15 DIAGNOSIS — S060X0A Concussion without loss of consciousness, initial encounter: Secondary | ICD-10-CM | POA: Diagnosis not present

## 2019-05-15 DIAGNOSIS — R51 Headache: Secondary | ICD-10-CM | POA: Diagnosis not present

## 2019-05-15 DIAGNOSIS — W208XXA Other cause of strike by thrown, projected or falling object, initial encounter: Secondary | ICD-10-CM | POA: Diagnosis not present

## 2019-05-15 DIAGNOSIS — S199XXA Unspecified injury of neck, initial encounter: Secondary | ICD-10-CM | POA: Diagnosis not present

## 2019-05-15 DIAGNOSIS — S0990XA Unspecified injury of head, initial encounter: Secondary | ICD-10-CM | POA: Diagnosis not present

## 2019-05-15 DIAGNOSIS — W19XXXA Unspecified fall, initial encounter: Secondary | ICD-10-CM | POA: Diagnosis not present

## 2019-05-15 DIAGNOSIS — Y998 Other external cause status: Secondary | ICD-10-CM | POA: Diagnosis not present

## 2019-05-15 DIAGNOSIS — M47812 Spondylosis without myelopathy or radiculopathy, cervical region: Secondary | ICD-10-CM | POA: Diagnosis not present

## 2019-05-15 DIAGNOSIS — M542 Cervicalgia: Secondary | ICD-10-CM | POA: Diagnosis not present

## 2019-05-15 DIAGNOSIS — S0003XA Contusion of scalp, initial encounter: Secondary | ICD-10-CM | POA: Diagnosis not present

## 2019-05-15 MED ORDER — EPIPEN 2-PAK 0.3 MG/0.3ML IJ SOAJ: INTRAMUSCULAR | 2 refills | Status: DC

## 2019-05-18 ENCOUNTER — Encounter: Payer: Self-pay | Admitting: Family Medicine

## 2019-05-18 ENCOUNTER — Ambulatory Visit (INDEPENDENT_AMBULATORY_CARE_PROVIDER_SITE_OTHER): Payer: Medicare Other | Admitting: Family Medicine

## 2019-05-18 VITALS — BP 123/72 | HR 85 | Ht 62.0 in | Wt 218.0 lb

## 2019-05-18 DIAGNOSIS — L89891 Pressure ulcer of other site, stage 1: Secondary | ICD-10-CM | POA: Diagnosis not present

## 2019-05-18 DIAGNOSIS — J328 Other chronic sinusitis: Secondary | ICD-10-CM | POA: Diagnosis not present

## 2019-05-18 DIAGNOSIS — R42 Dizziness and giddiness: Secondary | ICD-10-CM

## 2019-05-18 DIAGNOSIS — S0990XA Unspecified injury of head, initial encounter: Secondary | ICD-10-CM | POA: Diagnosis not present

## 2019-05-18 MED ORDER — AZITHROMYCIN 250 MG PO TABS: ORAL_TABLET | ORAL | 0 refills | Status: AC

## 2019-05-18 NOTE — Progress Notes (Signed)
Established Patient Office Visit  Subjective:  Patient ID: Tanya Hogan, female    DOB: 1951-06-02  Age: 68 y.o. MRN: [id number]  CC:  Chief Complaint  Patient presents with  . Hospitalization Follow-up    HPI [name] presents for hospital follow-up head injury.  She says she was attempting to reach for groceries on the top shelf when a metal can fell and struck her head mostly in the back of her head.  She says the can fell approximately 3 to 4 feet before striking her head.  She did not lose consciousness but did have some nausea but no vomiting.  They did do a head CT and CT cervical spine.  CT of the cervical spine showed no acute trauma but she did have some straightened cervical lordosis and some mild degenerative changes at C5-6 and C6-7 on the right greater than left.  Head CT was normal.  She is feeling some better she does not feel nearly as confused.  In fact the day of the event she has very distant memories and cannot remember it very well.  She denies any significant nausea.  She still feels like she has a soreness and stiffness in her left temple.  The goose egg only left top of her head seems to be getting smaller.  She also wanted let me know that she is been having more gas than usual over the last couple of weeks.  More belching and more gas from the colon.  She also wanted to follow-up on some the dizziness that she was experiencing before she was hit in the head.  She said she did saline irrigation of her sinuses and actually had some debris come out of her ears and says that her dizziness actually got significantly better for about a week and then it started to come back again.  She also has a tender area on the left axilla she would like me look at today.  She has not been able to actually see the area herself to see if there is any type of rash but says it just feels tender.  She currently wears 3 bras for support.  She is actually scheduled for surgery in  October for breast reduction  She is now seeing Dr. Mallie Mussel, podiatry in [facility].  Past Medical History:  Diagnosis Date  . Alkaline phosphatase elevation   . Asthma   . Breast cancer ([place]) 2011  . Carpal tunnel syndrome   . Carpal tunnel syndrome    bilateral  . CHF (congestive heart failure) ([place])   . Cirrhosis, non-alcoholic ([place])   . Closed fracture of unspecified part of radius (alone)   . DDD (degenerative disc disease), lumbar   . Depression    pt denies  . Diabetes mellitus without complication ([place])   . Enlarged heart   . Fibromyalgia   . GERD (gastroesophageal reflux disease)   . History of kidney stones   . HTN (hypertension)   . Hypothyroidism   . IBS (irritable bowel syndrome)   . LBBB (left bundle branch block)   . Microcytic anemia 05/14/2017  . Pneumonia   . Presence of permanent cardiac pacemaker   . PVD (peripheral vascular disease) ([place])    2019  . Sleep apnea    uses cpap    Past Surgical History:  Procedure Laterality Date  . BREAST LUMPECTOMY Left 2011  . COLONOSCOPY    . FOOT SURGERY    . KNEE SURGERY    .  PACEMAKER GENERATOR CHANGE N/A 08/23/2014   Procedure: PACEMAKER GENERATOR CHANGE;  Surgeon: Evans Lance, MD;  Location: [facility] CATH LAB;  Service: Cardiovascular;  Laterality: N/A;  . PACEMAKER PLACEMENT  2009   [facility]  . RECTAL SURGERY  1976  . TOTAL SHOULDER ARTHROPLASTY Right 03/10/2018  . TOTAL SHOULDER ARTHROPLASTY Right 03/10/2018   Procedure: RIGHT TOTAL SHOULDER ARTHROPLASTY;  Surgeon: [name], MD;  Location: [facility];  Service: Orthopedics;  Laterality: Right;  . TUBAL LIGATION      Family History  Problem Relation Age of Onset  . Melanoma Mother   . Parkinson's disease Mother   . Heart failure Other   . Breast cancer Other   . Hypertension Other     Social History   Socioeconomic History  . Marital status: Divorced    Spouse name: Not on file  . Number of children: 2  . Years of education: 42  .  Highest education level: Associate degree: academic program  Occupational History  . 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 Financial resource strain: Not hard at all  . Food insecurity    Worry: Never true    Inability: Never true  . Transportation needs    Medical: No    Non-medical: No  Tobacco Use  . Smoking status: Former Smoker    Packs/day: 0.00    Years: 0.00    Pack years: 0.00    Quit date: 08/24/1966    Years since quitting: 52.7  . Smokeless tobacco: Never Used  Substance and Sexual Activity  . Alcohol use: No  . Drug use: No  . Sexual activity: Not Currently  Lifestyle  . Physical activity    Days per week: 0 days    Minutes per session: Not on file  . Stress: Not at all  Relationships  . Social Herbalist on phone: More than three times a week    Gets together: Never    Attends religious service: Never    Active member of club or organization: No    Attends meetings of clubs or organizations: Never    Relationship status: Divorced  . Intimate partner violence    Fear of current or ex partner: No    Emotionally abused: No    Physically abused: No    Forced sexual activity: No  Other Topics Concern  . Not on file  Social History Narrative   Retired. 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 Likes Systems developer and music. Loves animals.    Outpatient Medications Prior to Visit  Medication Sig Dispense Refill  . ACCU-CHEK FASTCLIX LANCETS MISC Test twice a day. Dx:E11.8 204 each 11  . alendronate (FOSAMAX) 70 MG tablet Take 1 tablet (70 mg total) by mouth every 7 (seven) days. Take with a full glass of water on an empty stomach. 12 tablet 4  . Blood Glucose Monitoring Suppl (ACCU-CHEK AVIVA PLUS) w/Device KIT For testing blood sugars twice daily. Dx: E11.8 1 kit 0  . bumetanide (BUMEX) 1 MG tablet Take 1 tablet (1 mg total) by mouth 2 (two) times daily. 180 tablet 1  . Calcium Carb-Cholecalciferol  (CALCIUM 500/D) 500-400 MG-UNIT CHEW Chew 600 mg by mouth 3 (three) times daily.     [name] dicyclomine (BENTYL) 20 MG tablet Take 10-20 mg by mouth every 8 (eight) hours as needed for spasms (cramps).   11  . diltiazem (CARDIZEM) 120 MG tablet TAKE 1  TABLET BY MOUTH  DAILY 90 tablet 3  . EPIPEN 2-PAK 0.3 MG/0.3ML SOAJ injection INJECT 1 PEN INTO MUSCLE OF LATERAL THIGH FOR SEVERE ALLERGIC REACTION. CALL 911 AFTER USING THIS MEDICATION. 1 each 2  . Fluticasone-Salmeterol (ADVAIR DISKUS) 250-50 MCG/DOSE AEPB Inhale 1 puff into the lungs 2 (two) times daily. 3 each 0  . glucose blood (ACCU-CHEK AVIVA PLUS) test strip For testing blood sugars twice daily. Dx: E11.8 200 each 4  . Insulin Pen Needle (BD PEN NEEDLE NANO U/F) 32G X 4 MM MISC Use once daily as directed 200 each 6  . ketotifen (ZADITOR) 0.025 % ophthalmic solution Place 1 drop into both eyes 2 (two) times daily. 5 mL 1  . LANTUS SOLOSTAR 100 UNIT/ML Solostar Pen INJECT SUBCUTANEOUSLY 40  UNITS AT BEDTIME 45 mL 4  . levalbuterol (XOPENEX) 1.25 MG/3ML nebulizer solution Take 1.25 mg by nebulization every 8 (eight) hours as needed for wheezing or shortness of breath.   2  . levothyroxine (SYNTHROID) 75 MCG tablet Take 1 tablet (75 mcg total) by mouth daily. 90 tablet 1  . Multiple Vitamins-Minerals (OCUVITE EYE + MULTI) TABS Take 1 tablet by mouth daily.    . nebivolol (BYSTOLIC) 10 MG tablet Take 1 tablet (10 mg total) by mouth daily. 90 tablet 1  . pravastatin (PRAVACHOL) 40 MG tablet Take 40 mg by mouth daily.    [name] PROAIR HFA 108 (90 Base) MCG/ACT inhaler Inhale 1-2 puffs into the lungs every 6 (six) hours as needed for wheezing or shortness of breath. To hold pt over until mail order arrives 8.5 g 0  . RESTASIS 0.05 % ophthalmic emulsion      No facility-administered medications prior to visit.     Allergies  Allergen Reactions  . Penicillins Anaphylaxis    Has patient had a PCN reaction causing immediate rash, facial/tongue/throat  swelling, SOB or lightheadedness with hypotension: Yes Has patient had a PCN reaction causing severe rash involving mucus membranes or skin necrosis: No Has patient had a PCN reaction that required hospitalization: No Has patient had a PCN reaction occurring within the last 10 years: No If all of the above answers are "NO", then may proceed with Cephalosporin use.   . Naproxen Swelling  . Accupril [Quinapril Hcl] Other (See Comments)    Hyperkalemia  . Aspirin Other (See Comments)    Pt states "burns holes in stomach", ulcers   . Atorvastatin Other (See Comments)    msucle aches  . Baking Soda-Fluoride [Sodium Fluoride] Itching  . Celebrex [Celecoxib]     ulcers  . Codeine Nausea And Vomiting  . Fluticasone Other (See Comments)    Nasal ulcer.    . Furosemide Other (See Comments)    Nose bleed  . Lactose   . Lactose Intolerance (Gi) Other (See Comments)    Increased calcium, sneezing, nasal issues  . Levocetirizine     Doesn't recall  . Losartan Potassium Itching  . Metformin And Related Diarrhea  . Miralax  [Polyethylene Glycol 3350]   . Nasonex  [Mometasone Furoate]   . Nasonex [Mometasone Furoate] Other (See Comments)    nosebleed  . Rifampin Other (See Comments)    DOES NOT REMEMBER THIS MEDICATION OR ALLERGY  . Wellbutrin [Bupropion] Other (See Comments)    Muscle aches, pain, "made my skin crawl"  . Clindamycin/Lincomycin Rash  . Iodinated Diagnostic Agents Itching and Rash    Pt states she has had ultrasound with dye but was fine  . Lanolin-Petrolatum  Rash  . Miralax [Polyethylene Glycol] Rash  . Quinine Rash  . Sulfa Antibiotics Rash  . Sulfonamide Derivatives Rash  . Tramadol Rash    Anxiety  . Wool Alcohol [Lanolin] Rash  . Zoledronic Acid Other (See Comments) and Rash    shaking    ROS Review of Systems    Objective:    Physical Exam  Constitutional: She is oriented to person, place, and time. She appears well-developed and well-nourished.  HENT:   Head: Normocephalic and atraumatic.  Right Ear: External ear normal.  Left Ear: External ear normal.  Nose: Nose normal.  Mouth/Throat: Oropharynx is clear and moist.  abnromal TM on the left vs debris on the TM.  Canal is clear. Right TM and canal are clear.   Eyes: Conjunctivae are normal.  Neck: Neck supple. No thyromegaly present.  Cardiovascular: Normal rate, regular rhythm and normal heart sounds.  Pulmonary/Chest: Effort normal and breath sounds normal.  Musculoskeletal:     Comments: She has a slightly reddened area underneath the right axilla where her bra strap hits.  Neurological: She is alert and oriented to person, place, and time. She displays normal reflexes. No cranial nerve deficit. She exhibits normal muscle tone. Coordination normal.  Skin: Skin is warm and dry.   Skin under the right axilla is red and blanches to touch and is tender.  There is no actual breakdown of skin.   Psychiatric: She has a normal mood and affect. Her behavior is normal. Thought content normal.    BP 123/72   Pulse 85   Ht 5' 2"  (1.575 m)   Wt 218 lb (98.9 kg)   SpO2 98%   BMI 39.87 kg/m  Wt Readings from Last 3 Encounters:  05/18/19 218 lb (98.9 kg)  04/11/19 220 lb (99.8 kg)  03/14/19 218 lb (98.9 kg)     Health Maintenance Due  Topic Date Due  . OPHTHALMOLOGY EXAM  04/08/2019    There are no preventive care reminders to display for this patient.  Lab Results  Component Value Date   TSH 2.44 04/11/2019   Lab Results  Component Value Date   WBC 6.5 03/14/2019   HGB 14.1 03/14/2019   HCT 42.7 03/14/2019   MCV 92.0 03/14/2019   PLT 287 03/14/2019   Lab Results  Component Value Date   NA 141 04/11/2019   K 4.1 04/11/2019   CHLORIDE 105 10/30/2014   CO2 29 04/11/2019   GLUCOSE 139 (H) 04/11/2019   BUN 11 04/11/2019   CREATININE 0.80 04/11/2019   BILITOT 0.5 04/11/2019   ALKPHOS 100 03/02/2018   AST 20 04/11/2019   ALT 19 04/11/2019   PROT 7.0 04/11/2019    ALBUMIN 4.0 03/02/2018   CALCIUM 10.5 (H) 04/11/2019   ANIONGAP 8 03/11/2018   EGFR 78 (L) 10/30/2014   Lab Results  Component Value Date   CHOL 164 04/11/2019   Lab Results  Component Value Date   HDL 68 04/11/2019   Lab Results  Component Value Date   LDLCALC 76 04/11/2019   Lab Results  Component Value Date   TRIG 121 04/11/2019   Lab Results  Component Value Date   CHOLHDL 2.4 04/11/2019   Lab Results  Component Value Date   HGBA1C 6.5 (A) 04/11/2019      Assessment & Plan:   Problem List Items Addressed This Visit      Other   Dizziness    Other Visit Diagnoses    Traumatic injury of  head, initial encounter    -  Primary   Other chronic sinusitis       Relevant Medications   azithromycin (ZITHROMAX) 250 MG tablet   Pressure injury of other site, stage 1         Head injury - she is feeling much better though symptoms have not completely resolved..  Reviewed results with her. Will likely take a couple more weeks to improve.  Chronic sinusitis - seen on CT recently.  She does have surgery coming up some medical head and just give her azithromycin for now.  But we may need to look at a more long-term 2 to 3-week treatment after her surgery.  Dizziness-may be related to the chronic sinus symptoms that we saw on the head CT.  After her breast surgery I would like for her to consider treatment for may be chronic sinusitis versus actually seeing ENT as she has also had prior sinus surgery.  The pressure sore underneath the right axilla where her bra is pressing.  It is red and blanches to touch and is tender.  There is no actual breakdown of skin.  But we did discuss getting pressure off of it so that it can heal.  She says she cannot go without wearing her bra so recommended that she put a little gauze in between the bra strap and the skin to see if that would help.  Meds ordered this encounter  Medications  . azithromycin (ZITHROMAX) 250 MG tablet    Sig: 2  Ttabs PO on Day 1, then one a day x 4 days.    Dispense:  6 tablet    Refill:  0    Follow-up: No follow-ups on file.    Beatrice Lecher, MD

## 2019-05-22 DIAGNOSIS — Z01818 Encounter for other preprocedural examination: Secondary | ICD-10-CM | POA: Diagnosis not present

## 2019-05-24 DIAGNOSIS — J3081 Allergic rhinitis due to animal (cat) (dog) hair and dander: Secondary | ICD-10-CM | POA: Diagnosis not present

## 2019-05-24 DIAGNOSIS — J3089 Other allergic rhinitis: Secondary | ICD-10-CM | POA: Diagnosis not present

## 2019-05-24 DIAGNOSIS — J4540 Moderate persistent asthma, uncomplicated: Secondary | ICD-10-CM | POA: Diagnosis not present

## 2019-05-24 DIAGNOSIS — J301 Allergic rhinitis due to pollen: Secondary | ICD-10-CM | POA: Diagnosis not present

## 2019-05-25 DIAGNOSIS — M546 Pain in thoracic spine: Secondary | ICD-10-CM | POA: Diagnosis not present

## 2019-05-25 DIAGNOSIS — Z886 Allergy status to analgesic agent status: Secondary | ICD-10-CM | POA: Diagnosis not present

## 2019-05-25 DIAGNOSIS — M1990 Unspecified osteoarthritis, unspecified site: Secondary | ICD-10-CM | POA: Diagnosis not present

## 2019-05-25 DIAGNOSIS — E119 Type 2 diabetes mellitus without complications: Secondary | ICD-10-CM | POA: Diagnosis not present

## 2019-05-25 DIAGNOSIS — E739 Lactose intolerance, unspecified: Secondary | ICD-10-CM | POA: Diagnosis not present

## 2019-05-25 DIAGNOSIS — Z882 Allergy status to sulfonamides status: Secondary | ICD-10-CM | POA: Diagnosis not present

## 2019-05-25 DIAGNOSIS — M797 Fibromyalgia: Secondary | ICD-10-CM | POA: Diagnosis not present

## 2019-05-25 DIAGNOSIS — Z888 Allergy status to other drugs, medicaments and biological substances status: Secondary | ICD-10-CM | POA: Diagnosis not present

## 2019-05-25 DIAGNOSIS — Z87891 Personal history of nicotine dependence: Secondary | ICD-10-CM | POA: Diagnosis not present

## 2019-05-25 DIAGNOSIS — Z880 Allergy status to penicillin: Secondary | ICD-10-CM | POA: Diagnosis not present

## 2019-05-25 DIAGNOSIS — N6012 Diffuse cystic mastopathy of left breast: Secondary | ICD-10-CM | POA: Diagnosis not present

## 2019-05-25 DIAGNOSIS — E785 Hyperlipidemia, unspecified: Secondary | ICD-10-CM | POA: Diagnosis not present

## 2019-05-25 DIAGNOSIS — N62 Hypertrophy of breast: Secondary | ICD-10-CM | POA: Diagnosis not present

## 2019-05-25 DIAGNOSIS — Z885 Allergy status to narcotic agent status: Secondary | ICD-10-CM | POA: Diagnosis not present

## 2019-05-25 DIAGNOSIS — Z950 Presence of cardiac pacemaker: Secondary | ICD-10-CM | POA: Diagnosis not present

## 2019-05-25 DIAGNOSIS — G4733 Obstructive sleep apnea (adult) (pediatric): Secondary | ICD-10-CM | POA: Diagnosis not present

## 2019-05-25 DIAGNOSIS — E039 Hypothyroidism, unspecified: Secondary | ICD-10-CM | POA: Diagnosis not present

## 2019-05-25 DIAGNOSIS — I110 Hypertensive heart disease with heart failure: Secondary | ICD-10-CM | POA: Diagnosis not present

## 2019-05-25 DIAGNOSIS — D0511 Intraductal carcinoma in situ of right breast: Secondary | ICD-10-CM | POA: Diagnosis not present

## 2019-05-25 DIAGNOSIS — Z881 Allergy status to other antibiotic agents status: Secondary | ICD-10-CM | POA: Diagnosis not present

## 2019-05-25 DIAGNOSIS — K219 Gastro-esophageal reflux disease without esophagitis: Secondary | ICD-10-CM | POA: Diagnosis not present

## 2019-05-25 DIAGNOSIS — D0501 Lobular carcinoma in situ of right breast: Secondary | ICD-10-CM | POA: Diagnosis not present

## 2019-05-25 DIAGNOSIS — J449 Chronic obstructive pulmonary disease, unspecified: Secondary | ICD-10-CM | POA: Diagnosis not present

## 2019-05-25 DIAGNOSIS — I509 Heart failure, unspecified: Secondary | ICD-10-CM | POA: Diagnosis not present

## 2019-05-26 DIAGNOSIS — Z885 Allergy status to narcotic agent status: Secondary | ICD-10-CM | POA: Diagnosis not present

## 2019-05-26 DIAGNOSIS — Z880 Allergy status to penicillin: Secondary | ICD-10-CM | POA: Diagnosis not present

## 2019-05-26 DIAGNOSIS — Z888 Allergy status to other drugs, medicaments and biological substances status: Secondary | ICD-10-CM | POA: Diagnosis not present

## 2019-05-26 DIAGNOSIS — Z881 Allergy status to other antibiotic agents status: Secondary | ICD-10-CM | POA: Diagnosis not present

## 2019-05-26 DIAGNOSIS — E039 Hypothyroidism, unspecified: Secondary | ICD-10-CM | POA: Diagnosis not present

## 2019-05-26 DIAGNOSIS — M1990 Unspecified osteoarthritis, unspecified site: Secondary | ICD-10-CM | POA: Diagnosis not present

## 2019-05-26 DIAGNOSIS — D0501 Lobular carcinoma in situ of right breast: Secondary | ICD-10-CM | POA: Diagnosis not present

## 2019-05-26 DIAGNOSIS — E119 Type 2 diabetes mellitus without complications: Secondary | ICD-10-CM | POA: Diagnosis not present

## 2019-05-26 DIAGNOSIS — Z882 Allergy status to sulfonamides status: Secondary | ICD-10-CM | POA: Diagnosis not present

## 2019-05-26 DIAGNOSIS — M797 Fibromyalgia: Secondary | ICD-10-CM | POA: Diagnosis not present

## 2019-05-26 DIAGNOSIS — I110 Hypertensive heart disease with heart failure: Secondary | ICD-10-CM | POA: Diagnosis not present

## 2019-05-26 DIAGNOSIS — J449 Chronic obstructive pulmonary disease, unspecified: Secondary | ICD-10-CM | POA: Diagnosis not present

## 2019-05-26 DIAGNOSIS — D0511 Intraductal carcinoma in situ of right breast: Secondary | ICD-10-CM | POA: Diagnosis not present

## 2019-05-26 DIAGNOSIS — Z87891 Personal history of nicotine dependence: Secondary | ICD-10-CM | POA: Diagnosis not present

## 2019-05-26 DIAGNOSIS — E785 Hyperlipidemia, unspecified: Secondary | ICD-10-CM | POA: Diagnosis not present

## 2019-05-26 DIAGNOSIS — E739 Lactose intolerance, unspecified: Secondary | ICD-10-CM | POA: Diagnosis not present

## 2019-05-26 DIAGNOSIS — K219 Gastro-esophageal reflux disease without esophagitis: Secondary | ICD-10-CM | POA: Diagnosis not present

## 2019-05-26 DIAGNOSIS — Z886 Allergy status to analgesic agent status: Secondary | ICD-10-CM | POA: Diagnosis not present

## 2019-05-26 DIAGNOSIS — Z950 Presence of cardiac pacemaker: Secondary | ICD-10-CM | POA: Diagnosis not present

## 2019-05-26 DIAGNOSIS — N6012 Diffuse cystic mastopathy of left breast: Secondary | ICD-10-CM | POA: Diagnosis not present

## 2019-05-26 DIAGNOSIS — I509 Heart failure, unspecified: Secondary | ICD-10-CM | POA: Diagnosis not present

## 2019-05-26 DIAGNOSIS — G4733 Obstructive sleep apnea (adult) (pediatric): Secondary | ICD-10-CM | POA: Diagnosis not present

## 2019-06-07 ENCOUNTER — Other Ambulatory Visit: Payer: Self-pay | Admitting: Family Medicine

## 2019-06-13 DIAGNOSIS — D0511 Intraductal carcinoma in situ of right breast: Secondary | ICD-10-CM | POA: Diagnosis not present

## 2019-06-15 DIAGNOSIS — D0511 Intraductal carcinoma in situ of right breast: Secondary | ICD-10-CM | POA: Insufficient documentation

## 2019-06-17 DIAGNOSIS — Z01818 Encounter for other preprocedural examination: Secondary | ICD-10-CM | POA: Diagnosis not present

## 2019-06-19 ENCOUNTER — Telehealth: Payer: Self-pay | Admitting: Internal Medicine

## 2019-06-19 NOTE — Telephone Encounter (Signed)
Faxed preop form not received from [facility]. Form to be faxed to device clinic fax #. Will return form ASAP.

## 2019-06-19 NOTE — Telephone Encounter (Signed)
  Tanya Hogan is calling to f/u on the device management form that was faxed over this morning

## 2019-06-20 ENCOUNTER — Other Ambulatory Visit: Payer: Self-pay | Admitting: Family Medicine

## 2019-06-20 DIAGNOSIS — I110 Hypertensive heart disease with heart failure: Secondary | ICD-10-CM | POA: Diagnosis not present

## 2019-06-20 DIAGNOSIS — Z880 Allergy status to penicillin: Secondary | ICD-10-CM | POA: Diagnosis not present

## 2019-06-20 DIAGNOSIS — Z79899 Other long term (current) drug therapy: Secondary | ICD-10-CM | POA: Diagnosis not present

## 2019-06-20 DIAGNOSIS — G4733 Obstructive sleep apnea (adult) (pediatric): Secondary | ICD-10-CM | POA: Diagnosis not present

## 2019-06-20 DIAGNOSIS — J449 Chronic obstructive pulmonary disease, unspecified: Secondary | ICD-10-CM | POA: Diagnosis not present

## 2019-06-20 DIAGNOSIS — K589 Irritable bowel syndrome without diarrhea: Secondary | ICD-10-CM | POA: Diagnosis not present

## 2019-06-20 DIAGNOSIS — N6021 Fibroadenosis of right breast: Secondary | ICD-10-CM | POA: Diagnosis not present

## 2019-06-20 DIAGNOSIS — Z91048 Other nonmedicinal substance allergy status: Secondary | ICD-10-CM | POA: Diagnosis not present

## 2019-06-20 DIAGNOSIS — Z803 Family history of malignant neoplasm of breast: Secondary | ICD-10-CM | POA: Diagnosis not present

## 2019-06-20 DIAGNOSIS — Z794 Long term (current) use of insulin: Secondary | ICD-10-CM | POA: Diagnosis not present

## 2019-06-20 DIAGNOSIS — Z950 Presence of cardiac pacemaker: Secondary | ICD-10-CM | POA: Diagnosis not present

## 2019-06-20 DIAGNOSIS — K219 Gastro-esophageal reflux disease without esophagitis: Secondary | ICD-10-CM | POA: Diagnosis not present

## 2019-06-20 DIAGNOSIS — Z91041 Radiographic dye allergy status: Secondary | ICD-10-CM | POA: Diagnosis not present

## 2019-06-20 DIAGNOSIS — E119 Type 2 diabetes mellitus without complications: Secondary | ICD-10-CM | POA: Diagnosis not present

## 2019-06-20 DIAGNOSIS — Z882 Allergy status to sulfonamides status: Secondary | ICD-10-CM | POA: Diagnosis not present

## 2019-06-20 DIAGNOSIS — Z86010 Personal history of colonic polyps: Secondary | ICD-10-CM | POA: Diagnosis not present

## 2019-06-20 DIAGNOSIS — N641 Fat necrosis of breast: Secondary | ICD-10-CM | POA: Diagnosis not present

## 2019-06-20 DIAGNOSIS — Z886 Allergy status to analgesic agent status: Secondary | ICD-10-CM | POA: Diagnosis not present

## 2019-06-20 DIAGNOSIS — Z888 Allergy status to other drugs, medicaments and biological substances status: Secondary | ICD-10-CM | POA: Diagnosis not present

## 2019-06-20 DIAGNOSIS — Z87891 Personal history of nicotine dependence: Secondary | ICD-10-CM | POA: Diagnosis not present

## 2019-06-20 DIAGNOSIS — I10 Essential (primary) hypertension: Secondary | ICD-10-CM | POA: Diagnosis not present

## 2019-06-20 DIAGNOSIS — Z885 Allergy status to narcotic agent status: Secondary | ICD-10-CM | POA: Diagnosis not present

## 2019-06-20 DIAGNOSIS — D0511 Intraductal carcinoma in situ of right breast: Secondary | ICD-10-CM | POA: Diagnosis not present

## 2019-06-21 DIAGNOSIS — J449 Chronic obstructive pulmonary disease, unspecified: Secondary | ICD-10-CM | POA: Diagnosis not present

## 2019-06-21 DIAGNOSIS — Z91048 Other nonmedicinal substance allergy status: Secondary | ICD-10-CM | POA: Diagnosis not present

## 2019-06-21 DIAGNOSIS — G4733 Obstructive sleep apnea (adult) (pediatric): Secondary | ICD-10-CM | POA: Diagnosis not present

## 2019-06-21 DIAGNOSIS — Z794 Long term (current) use of insulin: Secondary | ICD-10-CM | POA: Diagnosis not present

## 2019-06-21 DIAGNOSIS — Z86010 Personal history of colonic polyps: Secondary | ICD-10-CM | POA: Diagnosis not present

## 2019-06-21 DIAGNOSIS — Z79899 Other long term (current) drug therapy: Secondary | ICD-10-CM | POA: Diagnosis not present

## 2019-06-21 DIAGNOSIS — Z886 Allergy status to analgesic agent status: Secondary | ICD-10-CM | POA: Diagnosis not present

## 2019-06-21 DIAGNOSIS — Z885 Allergy status to narcotic agent status: Secondary | ICD-10-CM | POA: Diagnosis not present

## 2019-06-21 DIAGNOSIS — N6021 Fibroadenosis of right breast: Secondary | ICD-10-CM | POA: Diagnosis not present

## 2019-06-21 DIAGNOSIS — K219 Gastro-esophageal reflux disease without esophagitis: Secondary | ICD-10-CM | POA: Diagnosis not present

## 2019-06-21 DIAGNOSIS — I10 Essential (primary) hypertension: Secondary | ICD-10-CM | POA: Diagnosis not present

## 2019-06-21 DIAGNOSIS — E119 Type 2 diabetes mellitus without complications: Secondary | ICD-10-CM | POA: Diagnosis not present

## 2019-06-21 DIAGNOSIS — Z87891 Personal history of nicotine dependence: Secondary | ICD-10-CM | POA: Diagnosis not present

## 2019-06-21 DIAGNOSIS — C50912 Malignant neoplasm of unspecified site of left female breast: Secondary | ICD-10-CM | POA: Diagnosis not present

## 2019-06-21 DIAGNOSIS — K589 Irritable bowel syndrome without diarrhea: Secondary | ICD-10-CM | POA: Diagnosis not present

## 2019-06-21 DIAGNOSIS — Z91041 Radiographic dye allergy status: Secondary | ICD-10-CM | POA: Diagnosis not present

## 2019-06-21 DIAGNOSIS — Z880 Allergy status to penicillin: Secondary | ICD-10-CM | POA: Diagnosis not present

## 2019-06-21 DIAGNOSIS — Z882 Allergy status to sulfonamides status: Secondary | ICD-10-CM | POA: Diagnosis not present

## 2019-06-21 DIAGNOSIS — Z803 Family history of malignant neoplasm of breast: Secondary | ICD-10-CM | POA: Diagnosis not present

## 2019-06-21 DIAGNOSIS — Z950 Presence of cardiac pacemaker: Secondary | ICD-10-CM | POA: Diagnosis not present

## 2019-06-21 DIAGNOSIS — Z888 Allergy status to other drugs, medicaments and biological substances status: Secondary | ICD-10-CM | POA: Diagnosis not present

## 2019-07-03 ENCOUNTER — Ambulatory Visit (INDEPENDENT_AMBULATORY_CARE_PROVIDER_SITE_OTHER): Payer: Medicare Other | Admitting: *Deleted

## 2019-07-03 DIAGNOSIS — I442 Atrioventricular block, complete: Secondary | ICD-10-CM

## 2019-07-03 DIAGNOSIS — I5022 Chronic systolic (congestive) heart failure: Secondary | ICD-10-CM | POA: Diagnosis not present

## 2019-07-04 ENCOUNTER — Other Ambulatory Visit: Payer: Self-pay | Admitting: Family Medicine

## 2019-07-04 LAB — CUP PACEART REMOTE DEVICE CHECK
Battery Remaining Longevity: 35 mo
Battery Voltage: 2.96 V
Brady Statistic AP VP Percent: 54.73 %
Brady Statistic AP VS Percent: 0.07 %
Brady Statistic AS VP Percent: 44.63 %
Brady Statistic AS VS Percent: 0.56 %
Brady Statistic RA Percent Paced: 53.53 %
Brady Statistic RV Percent Paced: 97.72 %
Date Time Interrogation Session: 20201109161213
Implantable Lead Implant Date: 20030324
Implantable Lead Implant Date: 20030324
Implantable Lead Implant Date: 20030324
Implantable Lead Location: 753858
Implantable Lead Location: 753859
Implantable Lead Location: 753860
Implantable Lead Model: 4068
Implantable Lead Model: 4068
Implantable Lead Model: 4193
Implantable Pulse Generator Implant Date: 20151231
Lead Channel Impedance Value: 1178 Ohm
Lead Channel Impedance Value: 380 Ohm
Lead Channel Impedance Value: 399 Ohm
Lead Channel Impedance Value: 399 Ohm
Lead Channel Impedance Value: 4047 Ohm
Lead Channel Impedance Value: 4047 Ohm
Lead Channel Impedance Value: 4047 Ohm
Lead Channel Impedance Value: 551 Ohm
Lead Channel Impedance Value: 969 Ohm
Lead Channel Pacing Threshold Amplitude: 0.875 V
Lead Channel Pacing Threshold Amplitude: 2.125 V
Lead Channel Pacing Threshold Pulse Width: 0.4 ms
Lead Channel Pacing Threshold Pulse Width: 0.4 ms
Lead Channel Sensing Intrinsic Amplitude: 1.125 mV
Lead Channel Sensing Intrinsic Amplitude: 1.125 mV
Lead Channel Sensing Intrinsic Amplitude: 7 mV
Lead Channel Sensing Intrinsic Amplitude: 7 mV
Lead Channel Setting Pacing Amplitude: 1.5 V
Lead Channel Setting Pacing Amplitude: 2.5 V
Lead Channel Setting Pacing Amplitude: 3.5 V
Lead Channel Setting Pacing Pulse Width: 0.8 ms
Lead Channel Setting Pacing Pulse Width: 0.8 ms
Lead Channel Setting Sensing Sensitivity: 4 mV

## 2019-07-06 DIAGNOSIS — E039 Hypothyroidism, unspecified: Secondary | ICD-10-CM | POA: Diagnosis not present

## 2019-07-06 DIAGNOSIS — Z87891 Personal history of nicotine dependence: Secondary | ICD-10-CM | POA: Diagnosis not present

## 2019-07-06 DIAGNOSIS — I5022 Chronic systolic (congestive) heart failure: Secondary | ICD-10-CM | POA: Diagnosis not present

## 2019-07-06 DIAGNOSIS — J441 Chronic obstructive pulmonary disease with (acute) exacerbation: Secondary | ICD-10-CM | POA: Diagnosis not present

## 2019-07-06 DIAGNOSIS — E119 Type 2 diabetes mellitus without complications: Secondary | ICD-10-CM | POA: Diagnosis not present

## 2019-07-06 DIAGNOSIS — E118 Type 2 diabetes mellitus with unspecified complications: Secondary | ICD-10-CM | POA: Diagnosis not present

## 2019-07-06 DIAGNOSIS — N62 Hypertrophy of breast: Secondary | ICD-10-CM | POA: Diagnosis not present

## 2019-07-06 DIAGNOSIS — I447 Left bundle-branch block, unspecified: Secondary | ICD-10-CM | POA: Diagnosis not present

## 2019-07-06 DIAGNOSIS — D0511 Intraductal carcinoma in situ of right breast: Secondary | ICD-10-CM | POA: Diagnosis not present

## 2019-07-06 DIAGNOSIS — E785 Hyperlipidemia, unspecified: Secondary | ICD-10-CM | POA: Diagnosis not present

## 2019-07-06 DIAGNOSIS — Z79899 Other long term (current) drug therapy: Secondary | ICD-10-CM | POA: Diagnosis not present

## 2019-07-06 DIAGNOSIS — M797 Fibromyalgia: Secondary | ICD-10-CM | POA: Diagnosis not present

## 2019-07-06 DIAGNOSIS — Z7982 Long term (current) use of aspirin: Secondary | ICD-10-CM | POA: Diagnosis not present

## 2019-07-12 ENCOUNTER — Ambulatory Visit (INDEPENDENT_AMBULATORY_CARE_PROVIDER_SITE_OTHER): Payer: Medicare Other | Admitting: Family Medicine

## 2019-07-12 ENCOUNTER — Telehealth: Payer: Self-pay | Admitting: Family Medicine

## 2019-07-12 ENCOUNTER — Other Ambulatory Visit: Payer: Self-pay

## 2019-07-12 ENCOUNTER — Encounter: Payer: Self-pay | Admitting: Family Medicine

## 2019-07-12 ENCOUNTER — Telehealth: Payer: Self-pay | Admitting: Sports Medicine

## 2019-07-12 VITALS — BP 128/53 | HR 77 | Ht 62.0 in | Wt 223.0 lb

## 2019-07-12 DIAGNOSIS — I10 Essential (primary) hypertension: Secondary | ICD-10-CM | POA: Diagnosis not present

## 2019-07-12 DIAGNOSIS — K7581 Nonalcoholic steatohepatitis (NASH): Secondary | ICD-10-CM

## 2019-07-12 DIAGNOSIS — E118 Type 2 diabetes mellitus with unspecified complications: Secondary | ICD-10-CM

## 2019-07-12 DIAGNOSIS — R1011 Right upper quadrant pain: Secondary | ICD-10-CM | POA: Diagnosis not present

## 2019-07-12 DIAGNOSIS — J4540 Moderate persistent asthma, uncomplicated: Secondary | ICD-10-CM | POA: Diagnosis not present

## 2019-07-12 LAB — POCT GLYCOSYLATED HEMOGLOBIN (HGB A1C): Hemoglobin A1C: 6.1 % — AB (ref 4.0–5.6)

## 2019-07-12 MED ORDER — ADVAIR DISKUS 250-50 MCG/DOSE IN AEPB: 1.0000 | INHALATION_SPRAY | Freq: Two times a day (BID) | RESPIRATORY_TRACT | 1 refills | Status: DC

## 2019-07-12 MED ORDER — PREDNISONE 20 MG PO TABS: 40.0000 mg | ORAL_TABLET | Freq: Every day | ORAL | 0 refills | Status: DC

## 2019-07-12 MED ORDER — AMBULATORY NON FORMULARY MEDICATION: 0 refills | Status: DC

## 2019-07-12 NOTE — Assessment & Plan Note (Addendum)
We can certainly try switching back to branded Advair which she would prefer so I did send over new prescription for her mail order in the meantime it does sound like is having a flare with her asthma some but I put her on a prednisone burst to see if this helps.  Okay to continue with nebulizer treatments as needed.  We will also order new supplies for her nebulizer machine through [facility].

## 2019-07-12 NOTE — Assessment & Plan Note (Signed)
Well controlled. Continue current regimen. Follow up in  6 mo  

## 2019-07-12 NOTE — Assessment & Plan Note (Signed)
Cutting back on her carbs and sugars and working on weight loss would make a big difference in her fatty liver.  Just encouraged her to continue to work at it.

## 2019-07-12 NOTE — Telephone Encounter (Signed)
Yes, now we use Dr. Clearance Coots at [facility].  She can really just call them and make an appointment for orthotics.

## 2019-07-12 NOTE — Telephone Encounter (Signed)
Patient is aware and will call their office to get them completed. No other questions.

## 2019-07-12 NOTE — Telephone Encounter (Signed)
Please call patient and let her know that I did find her gallbladder scan from 2018 at [facility] it was basically normal they did not see anything worrisome no abnormal function.  So if she is having pain in that right upper quadrant again we can always start with an ultrasound so there is no radiation etc. and see if the gallbladder looks normal.  I went ahead and placed an order but if she does not want to do it we can always cancel it.

## 2019-07-12 NOTE — Assessment & Plan Note (Signed)
Gave her praise and encouragement today as her A1c is down to 6.1, from previous of 6.5.  It sounds like some of the dietary changes that she has made more recently have made a good impact on her blood sugars even if her weight has not changed significantly.  We just discussed the overall goal of getting more healthy and controlling her blood sugars and her blood pressure etc.  Also encouraged her to try to be more active we discussed walking at the local parks since she is not able to do Silver sneakers right now.

## 2019-07-12 NOTE — Telephone Encounter (Signed)
Pt wasn't home will try later.Maryruth Eve, [name], CMA

## 2019-07-12 NOTE — Progress Notes (Signed)
Established Patient Office Visit  Subjective:  Patient ID: XOEY WARMOTH, female    DOB: February 09, 1951  Age: 68 y.o. MRN: [id number]  CC:  Chief Complaint  Patient presents with  . Diabetes    HPI Tanya Hogan presents for     Diabetes - no hypoglycemic events. No wounds or sores that are not healing well. No increased thirst or urination. Checking glucose at home. Taking medications as prescribed without any side effects.  F/U asthma -she says she has not been doing well since her inhaler changed.  We have received notification from her pharmacy to switch her to generic Advair so they sent a prescription for [facility] which she has been on for little over 2 weeks.  She says ever since she switched she is just felt like she has had more cough and shortness of breath.  No fevers chills or sweats but she has been getting some phlegm up.  She felt much better on the brand Advair she says she has been using her nebulizer almost every morning and occasionally in the evenings but not every day.  No chest pain.  She has been having some recent onset of right upper quadrant pain similar to what she experienced a couple years ago when she went to [facility] and had a gallbladder evaluation done.  She says the most like a stinging sensation.  It comes and goes.  She had a nuclear hepatobiliary scan done March 30, 2017 at [facility].  Gallbladder ejection fraction was calculated to be 89% and was a normal study.  She has had some dietary changes recently.  She went to see Dr. Georgiann Cocker who encouraged her to really cut out carbs and sugars from her diet.  But she had some questions around some of the dietary recommendations today.  She also had some questions about ability to increase her heart rate since she has a pacemaker.  She says that she would really like to use lose weight but says that she has never been able to lose a significant amount of weight most of her adult life.  She says that her weight can  fluctuate as much as 15 to 20 pounds from day to day  Past Medical History:  Diagnosis Date  . Alkaline phosphatase elevation   . Asthma   . Breast cancer ([place]) 2011  . Carpal tunnel syndrome   . Carpal tunnel syndrome    bilateral  . CHF (congestive heart failure) ([place])   . Cirrhosis, non-alcoholic ([place])   . Closed fracture of unspecified part of radius (alone)   . DDD (degenerative disc disease), lumbar   . Depression    pt denies  . Diabetes mellitus without complication ([place])   . Enlarged heart   . Fibromyalgia   . GERD (gastroesophageal reflux disease)   . History of kidney stones   . HTN (hypertension)   . Hypothyroidism   . IBS (irritable bowel syndrome)   . LBBB (left bundle branch block)   . Microcytic anemia 05/14/2017  . Pneumonia   . Presence of permanent cardiac pacemaker   . PVD (peripheral vascular disease) ([place])    2019  . Sleep apnea    uses cpap    Past Surgical History:  Procedure Laterality Date  . BREAST LUMPECTOMY Left 2011  . COLONOSCOPY    . FOOT SURGERY    . KNEE SURGERY    . PACEMAKER GENERATOR CHANGE N/A 08/23/2014   Procedure: PACEMAKER GENERATOR CHANGE;  Surgeon:  Evans Lance, MD;  Location: [facility] CATH LAB;  Service: Cardiovascular;  Laterality: N/A;  . PACEMAKER PLACEMENT  2009   [facility]  . RECTAL SURGERY  1976  . TOTAL SHOULDER ARTHROPLASTY Right 03/10/2018  . TOTAL SHOULDER ARTHROPLASTY Right 03/10/2018   Procedure: RIGHT TOTAL SHOULDER ARTHROPLASTY;  Surgeon: [name], MD;  Location: [facility];  Service: Orthopedics;  Laterality: Right;  . TUBAL LIGATION      Family History  Problem Relation Age of Onset  . Melanoma Mother   . Parkinson's disease Mother   . Heart failure Other   . Breast cancer Other   . Hypertension Other     Social History   Socioeconomic History  . Marital status: Divorced    Spouse name: Not on file  . Number of children: 2  . Years of education: 32  . Highest education level:  Associate degree: academic program  Occupational History  . 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 Financial resource strain: Not hard at all  . Food insecurity    Worry: Never true    Inability: Never true  . Transportation needs    Medical: No    Non-medical: No  Tobacco Use  . Smoking status: Former Smoker    Packs/day: 0.00    Years: 0.00    Pack years: 0.00    Quit date: 08/24/1966    Years since quitting: 52.9  . Smokeless tobacco: Never Used  Substance and Sexual Activity  . Alcohol use: No  . Drug use: No  . Sexual activity: Not Currently  Lifestyle  . Physical activity    Days per week: 0 days    Minutes per session: Not on file  . Stress: Not at all  Relationships  . Social Herbalist on phone: More than three times a week    Gets together: Never    Attends religious service: Never    Active member of club or organization: No    Attends meetings of clubs or organizations: Never    Relationship status: Divorced  . Intimate partner violence    Fear of current or ex partner: No    Emotionally abused: No    Physically abused: No    Forced sexual activity: No  Other Topics Concern  . Not on file  Social History Narrative   Retired. 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 Likes Systems developer and music. Loves animals.    Outpatient Medications Prior to Visit  Medication Sig Dispense Refill  . ACCU-CHEK FASTCLIX LANCETS MISC Test twice a day. Dx:E11.8 204 each 11  . alendronate (FOSAMAX) 70 MG tablet Take 1 tablet (70 mg total) by mouth every 7 (seven) days. Take with a full glass of water on an empty stomach. 12 tablet 4  . Blood Glucose Monitoring Suppl (ACCU-CHEK AVIVA PLUS) w/Device KIT For testing blood sugars twice daily. Dx: E11.8 1 kit 0  . bumetanide (BUMEX) 1 MG tablet Take 1 tablet (1 mg total) by mouth 2 (two) times daily. 180 tablet 1  . BYSTOLIC 10 MG tablet TAKE 1 TABLET BY MOUTH  DAILY 90  tablet 3  . Calcium Carb-Cholecalciferol (CALCIUM 500/D) 500-400 MG-UNIT CHEW Chew 600 mg by mouth 3 (three) times daily.     [name] dicyclomine (BENTYL) 20 MG tablet Take 10-20 mg by mouth every 8 (eight) hours as needed for spasms (cramps).   11  . diltiazem (CARDIZEM) 120 MG  tablet TAKE 1 TABLET BY MOUTH  DAILY 90 tablet 3  . EPIPEN 2-PAK 0.3 MG/0.3ML SOAJ injection INJECT 1 PEN INTO MUSCLE OF LATERAL THIGH FOR SEVERE ALLERGIC REACTION. CALL 911 AFTER USING THIS MEDICATION. 1 each 2  . glucose blood (ACCU-CHEK AVIVA PLUS) test strip For testing blood sugars twice daily. Dx: E11.8 200 each 4  . Insulin Pen Needle (BD PEN NEEDLE NANO U/F) 32G X 4 MM MISC Use once daily as directed 200 each 6  . LANTUS SOLOSTAR 100 UNIT/ML Solostar Pen INJECT SUBCUTANEOUSLY 40  UNITS AT BEDTIME 45 mL 4  . levalbuterol (XOPENEX) 1.25 MG/3ML nebulizer solution Take 1.25 mg by nebulization every 8 (eight) hours as needed for wheezing or shortness of breath.   2  . levothyroxine (SYNTHROID) 75 MCG tablet Take 1 tablet (75 mcg total) by mouth daily. 90 tablet 1  . Multiple Vitamins-Minerals (OCUVITE EYE + MULTI) TABS Take 1 tablet by mouth daily.    . Olopatadine HCl 0.2 % SOLN Place 1 drop into both eyes daily.    . pravastatin (PRAVACHOL) 40 MG tablet TAKE 1 TABLET BY MOUTH AT  BEDTIME 90 tablet 3  . PROAIR HFA 108 (90 Base) MCG/ACT inhaler Inhale 1-2 puffs into the lungs every 6 (six) hours as needed for wheezing or shortness of breath. To hold pt over until mail order arrives 8.5 g 0  . RESTASIS 0.05 % ophthalmic emulsion     . tiotropium (SPIRIVA HANDIHALER) 18 MCG inhalation capsule Spiriva with HandiHaler 18 mcg and inhalation capsules    . WIXELA INHUB 250-50 MCG/DOSE AEPB USE 1 INHALATION BY MOUTH  TWICE DAILY 180 each 3  . ketotifen (ZADITOR) 0.025 % ophthalmic solution Place 1 drop into both eyes 2 (two) times daily. 5 mL 1   No facility-administered medications prior to visit.     Allergies  Allergen  Reactions  . Penicillins Anaphylaxis    Has patient had a PCN reaction causing immediate rash, facial/tongue/throat swelling, SOB or lightheadedness with hypotension: Yes Has patient had a PCN reaction causing severe rash involving mucus membranes or skin necrosis: No Has patient had a PCN reaction that required hospitalization: No Has patient had a PCN reaction occurring within the last 10 years: No If all of the above answers are "NO", then may proceed with Cephalosporin use.   . Naproxen Swelling  . Accupril [Quinapril Hcl] Other (See Comments)    Hyperkalemia  . Aspirin Other (See Comments)    Pt states "burns holes in stomach", ulcers   . Atorvastatin Other (See Comments)    msucle aches  . Baking Soda-Fluoride [Sodium Fluoride] Itching  . Celebrex [Celecoxib]     ulcers  . Codeine Nausea And Vomiting  . Fluticasone Other (See Comments)    Nasal ulcer.    . Furosemide Other (See Comments)    Nose bleed  . Lactose   . Lactose Intolerance (Gi) Other (See Comments)    Increased calcium, sneezing, nasal issues  . Levocetirizine     Doesn't recall  . Losartan Potassium Itching  . Metformin And Related Diarrhea  . Miralax  [Polyethylene Glycol 3350]   . Nasonex  [Mometasone Furoate]   . Nasonex [Mometasone Furoate] Other (See Comments)    nosebleed  . Rifampin Other (See Comments)    DOES NOT REMEMBER THIS MEDICATION OR ALLERGY  . Wellbutrin [Bupropion] Other (See Comments)    Muscle aches, pain, "made my skin crawl"  . Clindamycin/Lincomycin Rash  . Iodinated Diagnostic Agents Itching  and Rash    Pt states she has had ultrasound with dye but was fine  . Lanolin-Petrolatum Rash  . Miralax [Polyethylene Glycol] Rash  . Quinine Rash  . Sulfa Antibiotics Rash  . Sulfonamide Derivatives Rash  . Tramadol Rash    Anxiety  . Wool Alcohol [Lanolin] Rash  . Zoledronic Acid Other (See Comments) and Rash    shaking    ROS Review of Systems    Objective:    Physical Exam   Constitutional: She is oriented to person, place, and time. She appears well-developed and well-nourished.  HENT:  Head: Normocephalic and atraumatic.  Right Ear: External ear normal.  Left Ear: External ear normal.  Nose: Nose normal.  Mouth/Throat: Oropharynx is clear and moist.  TMs and canals are clear.   Eyes: Pupils are equal, round, and reactive to light. Conjunctivae and EOM are normal.  Neck: Neck supple. No thyromegaly present.  Cardiovascular: Normal rate, regular rhythm and normal heart sounds.  Pulmonary/Chest: Effort normal and breath sounds normal. She has no wheezes.  Lymphadenopathy:    She has no cervical adenopathy.  Neurological: She is alert and oriented to person, place, and time.  Skin: Skin is warm and dry.  Psychiatric: She has a normal mood and affect.    BP (!) 128/53   Pulse 77   Ht _0  (1.575 m)   Wt 223 lb (101.2 kg)   SpO2 98%   BMI 40.79 kg/m  Wt Readings from Last 3 Encounters:  07/12/19 223 lb (101.2 kg)  05/18/19 218 lb (98.9 kg)  04/11/19 220 lb (99.8 kg)     Health Maintenance Due  Topic Date Due  . OPHTHALMOLOGY EXAM  04/08/2019  . COLONOSCOPY  06/13/2019  . URINE MICROALBUMIN  08/26/2019    There are no preventive care reminders to display for this patient.  Lab Results  Component Value Date   TSH 2.44 04/11/2019   Lab Results  Component Value Date   WBC 6.5 03/14/2019   HGB 14.1 03/14/2019   HCT 42.7 03/14/2019   MCV 92.0 03/14/2019   PLT 287 03/14/2019   Lab Results  Component Value Date   NA 141 04/11/2019   K 4.1 04/11/2019   CHLORIDE 105 10/30/2014   CO2 29 04/11/2019   GLUCOSE 139 (H) 04/11/2019   BUN 11 04/11/2019   CREATININE 0.80 04/11/2019   BILITOT 0.5 04/11/2019   ALKPHOS 100 03/02/2018   AST 20 04/11/2019   ALT 19 04/11/2019   PROT 7.0 04/11/2019   ALBUMIN 4.0 03/02/2018   CALCIUM 10.5 (H) 04/11/2019   ANIONGAP 8 03/11/2018   EGFR 78 (L) 10/30/2014   Lab Results  Component Value Date    CHOL 164 04/11/2019   Lab Results  Component Value Date   HDL 68 04/11/2019   Lab Results  Component Value Date   LDLCALC 76 04/11/2019   Lab Results  Component Value Date   TRIG 121 04/11/2019   Lab Results  Component Value Date   CHOLHDL 2.4 04/11/2019   Lab Results  Component Value Date   HGBA1C 6.1 (A) 07/12/2019      Assessment & Plan:   Problem List Items Addressed This Visit      Cardiovascular and Mediastinum   Benign essential hypertension    Well controlled. Continue current regimen. Follow up in  6 mo        Respiratory   Asthma, moderate persistent    We can certainly try switching back to  branded Advair which she would prefer so I did send over new prescription for her mail order in the meantime it does sound like is having a flare with her asthma some but I put her on a prednisone burst to see if this helps.  Okay to continue with nebulizer treatments as needed.  We will also order new supplies for her [facility]bulizer [facility].      Relevant Medications   tiotropium (SPIRIVA HANDIHALER) 18 MCG inhalation capsule   ADVAIR DISKUS 250-50 MCG/DOSE AEPB   predniSONE (DELTASONE) 20 MG tablet   AMBULATORY NON FORMULARY MEDICATION     Digestive   NASH (nonalcoholic steatohepatitis)    Cutting back on her carbs and sugars and working on weight loss would make a big difference in her fatty liver.  Just encouraged her to continue to work at it.        Endocrine   Controlled diabetes mellitus type 2 with complications ([place]) - Primary    Gave her praise and encouragement today as her A1c is down to 6.1, from previous of 6.5.  It sounds like some of the dietary changes that she has made more recently have made a good impact on her blood sugars even if her weight has not changed significantly.  We just discussed the overall goal of getting more healthy and controlling her blood sugars and her blood pressure etc.  Also encouraged her to try to be  more active we discussed walking at the local parks since she is not able to do Silver sneakers right now.      Relevant Orders   POCT HgB A1C (Completed)    Other Visit Diagnoses    RUQ pain       Relevant Orders   US Abdomen Complete      Right upper quadrant pain-we can always start with ultrasound for further evaluation.  It sounds like right now her symptoms are fairly mild and she had a normal hepatobiliary scan done at [facility] in August 2018.  Meds ordered this encounter  Medications  . ADVAIR DISKUS 250-50 MCG/DOSE AEPB    Sig: Inhale 1 puff into the lungs 2 (two) times daily.    Dispense:  180 each    Refill:  1  . predniSONE (DELTASONE) 20 MG tablet    Sig: Take 2 tablets (40 mg total) by mouth daily with breakfast.    Dispense:  10 tablet    Refill:  0  . AMBULATORY NON FORMULARY MEDICATION    Sig: Medication Name: Tubing and supplies for nebulizer.  [facility] to [facility].    Dispense:  1 vial    Refill:  0    Follow-up: Return in about 3 months (around 10/12/2019) for Diabetes follow-up asthma.  .   Time spent 45 minutes, greater than 50% of that time spent on hypertension, right upper quadrant pain, asthma, diabetes, etc.  Beatrice Lecher, MD

## 2019-07-12 NOTE — Telephone Encounter (Signed)
Tanya Hogan was in today for an appt. She inquired about orthotics for a new pair of shoes that she has. I explained to her that you aren't doing those. Is there someone you can refer her to?

## 2019-07-14 ENCOUNTER — Other Ambulatory Visit: Payer: Self-pay | Admitting: Family Medicine

## 2019-07-14 DIAGNOSIS — E118 Type 2 diabetes mellitus with unspecified complications: Secondary | ICD-10-CM

## 2019-07-17 ENCOUNTER — Other Ambulatory Visit: Payer: Self-pay

## 2019-07-17 ENCOUNTER — Ambulatory Visit (INDEPENDENT_AMBULATORY_CARE_PROVIDER_SITE_OTHER): Payer: Medicare Other

## 2019-07-17 DIAGNOSIS — R1011 Right upper quadrant pain: Secondary | ICD-10-CM

## 2019-07-17 DIAGNOSIS — K8020 Calculus of gallbladder without cholecystitis without obstruction: Secondary | ICD-10-CM | POA: Diagnosis not present

## 2019-07-18 NOTE — Telephone Encounter (Signed)
No answer.Tanya Hogan, [name], CMA

## 2019-07-18 NOTE — Telephone Encounter (Signed)
Pt advised.Tanya Hogan, [name], CMA

## 2019-07-24 ENCOUNTER — Other Ambulatory Visit: Payer: Self-pay | Admitting: *Deleted

## 2019-07-24 MED ORDER — SPIRIVA HANDIHALER 18 MCG IN CAPS: ORAL_CAPSULE | RESPIRATORY_TRACT | 3 refills | Status: DC

## 2019-07-26 DIAGNOSIS — G4733 Obstructive sleep apnea (adult) (pediatric): Secondary | ICD-10-CM | POA: Diagnosis not present

## 2019-07-26 DIAGNOSIS — G4737 Central sleep apnea in conditions classified elsewhere: Secondary | ICD-10-CM | POA: Diagnosis not present

## 2019-07-31 ENCOUNTER — Other Ambulatory Visit: Payer: Self-pay

## 2019-07-31 DIAGNOSIS — K219 Gastro-esophageal reflux disease without esophagitis: Secondary | ICD-10-CM | POA: Diagnosis not present

## 2019-07-31 DIAGNOSIS — K581 Irritable bowel syndrome with constipation: Secondary | ICD-10-CM | POA: Diagnosis not present

## 2019-07-31 DIAGNOSIS — Z86010 Personal history of colonic polyps: Secondary | ICD-10-CM | POA: Diagnosis not present

## 2019-07-31 DIAGNOSIS — J4540 Moderate persistent asthma, uncomplicated: Secondary | ICD-10-CM

## 2019-07-31 DIAGNOSIS — K7400 Hepatic fibrosis, unspecified: Secondary | ICD-10-CM | POA: Diagnosis not present

## 2019-07-31 MED ORDER — AMBULATORY NON FORMULARY MEDICATION: 0 refills | Status: DC

## 2019-08-01 ENCOUNTER — Other Ambulatory Visit: Payer: Self-pay | Admitting: Family Medicine

## 2019-08-01 MED ORDER — SPIRIVA HANDIHALER 18 MCG IN CAPS: ORAL_CAPSULE | RESPIRATORY_TRACT | 0 refills | Status: DC

## 2019-08-01 NOTE — Telephone Encounter (Signed)
Patient called and spoke with me to let me know that her mail order is behind and I can send an emergency supply to [facility] for her. She has been out for 2 days and did not want to wait any longer. She stated that the post office told her " It may take a week or longer before the next shipment would arrive." I have let the patient know that a 30 day supply was sent to local pharmacy. No other questions at this time.

## 2019-08-02 NOTE — Progress Notes (Signed)
Remote pacemaker transmission.   

## 2019-08-03 DIAGNOSIS — D0511 Intraductal carcinoma in situ of right breast: Secondary | ICD-10-CM | POA: Diagnosis not present

## 2019-08-03 DIAGNOSIS — Z808 Family history of malignant neoplasm of other organs or systems: Secondary | ICD-10-CM | POA: Diagnosis not present

## 2019-08-03 DIAGNOSIS — Z803 Family history of malignant neoplasm of breast: Secondary | ICD-10-CM | POA: Diagnosis not present

## 2019-08-04 DIAGNOSIS — K8020 Calculus of gallbladder without cholecystitis without obstruction: Secondary | ICD-10-CM | POA: Diagnosis not present

## 2019-08-14 ENCOUNTER — Other Ambulatory Visit: Payer: Self-pay

## 2019-08-14 ENCOUNTER — Other Ambulatory Visit: Payer: Self-pay | Admitting: Family Medicine

## 2019-08-14 DIAGNOSIS — Z1379 Encounter for other screening for genetic and chromosomal anomalies: Secondary | ICD-10-CM | POA: Insufficient documentation

## 2019-08-14 DIAGNOSIS — Z1231 Encounter for screening mammogram for malignant neoplasm of breast: Secondary | ICD-10-CM

## 2019-08-14 DIAGNOSIS — J4540 Moderate persistent asthma, uncomplicated: Secondary | ICD-10-CM

## 2019-08-14 MED ORDER — AMBULATORY NON FORMULARY MEDICATION: 0 refills | Status: DC

## 2019-08-16 ENCOUNTER — Other Ambulatory Visit: Payer: Self-pay | Admitting: *Deleted

## 2019-08-16 DIAGNOSIS — E118 Type 2 diabetes mellitus with unspecified complications: Secondary | ICD-10-CM

## 2019-08-16 MED ORDER — ACCU-CHEK FASTCLIX LANCETS MISC: 4 refills | Status: DC

## 2019-08-22 ENCOUNTER — Ambulatory Visit: Payer: Medicare Other | Admitting: Family Medicine

## 2019-08-23 ENCOUNTER — Other Ambulatory Visit: Payer: Self-pay | Admitting: Family Medicine

## 2019-08-23 DIAGNOSIS — E118 Type 2 diabetes mellitus with unspecified complications: Secondary | ICD-10-CM

## 2019-08-23 MED ORDER — BLOOD GLUCOSE MONITOR KIT: PACK | 0 refills | Status: DC

## 2019-08-23 NOTE — Telephone Encounter (Signed)
Patient called and needed a new meter sent to [facility]. I have re-sent this to [facility] per patient request. No other questions.

## 2019-08-29 ENCOUNTER — Encounter: Payer: Self-pay | Admitting: Medical-Surgical

## 2019-08-29 ENCOUNTER — Ambulatory Visit (INDEPENDENT_AMBULATORY_CARE_PROVIDER_SITE_OTHER): Payer: Medicare Other | Admitting: Medical-Surgical

## 2019-08-29 VITALS — BP 145/69 | HR 59

## 2019-08-29 DIAGNOSIS — J4541 Moderate persistent asthma with (acute) exacerbation: Secondary | ICD-10-CM | POA: Diagnosis not present

## 2019-08-29 DIAGNOSIS — Z7951 Long term (current) use of inhaled steroids: Secondary | ICD-10-CM

## 2019-08-29 DIAGNOSIS — J0140 Acute pansinusitis, unspecified: Secondary | ICD-10-CM | POA: Insufficient documentation

## 2019-08-29 DIAGNOSIS — Z209 Contact with and (suspected) exposure to unspecified communicable disease: Secondary | ICD-10-CM

## 2019-08-29 MED ORDER — PREDNISONE 10 MG (21) PO TBPK: ORAL_TABLET | ORAL | 0 refills | Status: DC

## 2019-08-29 MED ORDER — DOXYCYCLINE HYCLATE 100 MG PO TABS: 100.0000 mg | ORAL_TABLET | Freq: Two times a day (BID) | ORAL | 0 refills | Status: AC

## 2019-08-29 NOTE — Progress Notes (Signed)
Virtual Visit via Telephone   I connected with  Tanya Hogan  on 08/29/19 by telephone/telehealth and verified that I am speaking with the correct person using two identifiers.   I discussed the limitations, risks, security and privacy concerns of performing an evaluation and management service by telephone, including the higher likelihood of inaccurate diagnosis and treatment, and the availability of in person appointments.  We also discussed the likely need of an additional face to face encounter for complete and high quality delivery of care.  I also discussed with the patient that there may be a patient responsible charge related to this service. The patient expressed understanding and wishes to proceed.  Provider location is in office. Patient location is at their home. People involved in care of the patient during this telehealth encounter were myself, my nurse/medical assistant, and my front office/scheduling team member.  CC: Sinus pain and pressure  HPI:  Pleasant 69 year old female presenting with complaints of sinus pain and pressure, headache, facial pain, eye pressure, postnasal drip, sore throat, productive cough starting 2 weeks ago.  Sputum yellowish-white, mostly in the morning with small amounts intermittently during the day.  Reports increased shortness of breath for the last week and has been using her Advair, Spiriva, and ProAir inhalers as needed.  Has not use the nebulizer albuterol.  Denies fever but reports having a couple of hot flashes.  She checked her temperature, it was below normal.  Endorses some looser stools but attributes this to taking Linzess.  Lives with her brother who has been coughing a lot recently.  Has been practicing social distancing and is aware of her multiple comorbidities but did go out to eat at a restaurant 2 weeks ago.   Review of Systems: No fevers, chills, night sweats, weight loss, chest pain, or shortness of breath.   Objective Findings:     General: Speaking full sentences, no audible heavy breathing.  Sounds alert and appropriately interactive.    Independent interpretation of tests performed by another provider:   None.  Impression and Recommendations:    Moderate persistent asthma with exacerbation Prednisone 10 mg taper over 12 days.  Continue Spiriva, Advair and as needed albuterol.  May use nebulizer instead of inhaler if this provides more relief.  Will be tested for Covid today.  Acute non-recurrent pansinusitis Doxycycline twice daily for 7 days.  Continue supportive treatment with as needed Tylenol, throat lozenges, and saline nasal sprays if desired.   I discussed the above assessment and treatment plan with the patient. The patient was provided an opportunity to ask questions and all were answered. The patient agreed with the plan and demonstrated an understanding of the instructions.   The patient was advised to call back or seek an in-person evaluation if the symptoms worsen or if the condition fails to improve as anticipated.   Return if symptoms worsen or fail to improve.   Clearnce Sorrel, DNP, APRN, FNP-BC

## 2019-08-29 NOTE — Assessment & Plan Note (Signed)
Prednisone 10 mg taper over 12 days.  Continue Spiriva, Advair and as needed albuterol.  May use nebulizer instead of inhaler if this provides more relief.  Will be tested for Covid today.

## 2019-08-29 NOTE — Assessment & Plan Note (Signed)
Doxycycline twice daily for 7 days.  Continue supportive treatment with as needed Tylenol, throat lozenges, and saline nasal sprays if desired.

## 2019-08-30 ENCOUNTER — Other Ambulatory Visit: Payer: Self-pay

## 2019-08-30 MED ORDER — ACCU-CHEK MULTICLIX LANCET DEV KIT: PACK | 0 refills | Status: DC

## 2019-08-31 ENCOUNTER — Telehealth: Payer: Self-pay

## 2019-08-31 ENCOUNTER — Ambulatory Visit (INDEPENDENT_AMBULATORY_CARE_PROVIDER_SITE_OTHER): Payer: Medicare Other | Admitting: Physician Assistant

## 2019-08-31 ENCOUNTER — Encounter: Payer: Self-pay | Admitting: Physician Assistant

## 2019-08-31 VITALS — BP 119/70 | HR 70 | Ht 62.0 in | Wt 223.0 lb

## 2019-08-31 DIAGNOSIS — R739 Hyperglycemia, unspecified: Secondary | ICD-10-CM

## 2019-08-31 DIAGNOSIS — E0965 Drug or chemical induced diabetes mellitus with hyperglycemia: Secondary | ICD-10-CM | POA: Diagnosis not present

## 2019-08-31 DIAGNOSIS — Z794 Long term (current) use of insulin: Secondary | ICD-10-CM | POA: Diagnosis not present

## 2019-08-31 DIAGNOSIS — Z7952 Long term (current) use of systemic steroids: Secondary | ICD-10-CM | POA: Diagnosis not present

## 2019-08-31 DIAGNOSIS — T380X5A Adverse effect of glucocorticoids and synthetic analogues, initial encounter: Secondary | ICD-10-CM

## 2019-08-31 MED ORDER — INSULIN PEN NEEDLE 31G X 8 MM MISC: 0 refills | Status: DC

## 2019-08-31 MED ORDER — INSULIN LISPRO (1 UNIT DIAL) 100 UNIT/ML (KWIKPEN): 3.0000 [IU] | PEN_INJECTOR | Freq: Three times a day (TID) | SUBCUTANEOUS | 0 refills | Status: DC | PRN

## 2019-08-31 NOTE — Telephone Encounter (Signed)
Would she be willing for one of the APP is to call her today to do a virtual visit or telephone visit to discuss what to do.  It sounds like she is not making a lot of good dietary choices especially while she is on prednisone.

## 2019-08-31 NOTE — Progress Notes (Signed)
Blood sugar - 300 this morning On recheck she states more around 200 this afternoon.  On prednisone  Telephone note from conversation with Levada Dy:   Tanya Hogan is currently being treated with prednisone. She called to report elevated blood sugars. She states today her blood sugar was 300 mg/dl. She wanted to know what she can do about the elevation. I asked what she has been eating for the last 2 days. I did advise her she would have to be very carefull with the amount of carbohydrates and sugars while taking the prednisone. Advised more lean meats and vegetables.    Eggs Spaghetti Pecan pie Coffee 2 biscuits sausage

## 2019-08-31 NOTE — Progress Notes (Signed)
Virtual Visit via Video (App used: DOXIMITY) Note  I connected with      Tanya Hogan on 12/05/19 at 9:40 PM  by a telemedicine application and verified that I am speaking with the correct person using two identifiers.  Patient is at home in [facility], [place] I am in office   I discussed the limitations of evaluation and management by telemedicine and the availability of in person appointments. The patient expressed understanding and agreed to proceed.  History of Present Illness: Tanya Hogan is a 69 y.o. female who would like to discuss hyperglycemia   Patient with well-controlled Type 2 DM with long-term insulin use (last A1C 6.1) presents with elevated home blood sugars for the last 2 days Started on Prednisone taper on 08/29/19 for asthma exacerbation She states prior to starting steroid her morning FBG was 102 FBG yesterday was >300 PPG last night was in the 200's She states she took 60U of Lantus last night to self-correct for this; this is 20U more than her usual dose FBG this AM was 171 Denies headache, dizziness, confusion, diaphoresis, nausea/vomiting    Observations/Objective: BP 119/70   Pulse 70   Ht 5' 2"  (1.575 m)   Wt 223 lb (101.2 kg)   SpO2 97%   BMI 40.79 kg/m  BP Readings from Last 3 Encounters:  11/06/19 (!) 137/55  10/20/19 135/76  10/06/19 138/68   Home VS reviewed Gen: alert, well appearing, no distress Pulm: speaking in full sentences, normal work of breathing Neuro: alert and oriented x 3   Lab and Radiology Results No results found for this or any previous visit (from the past 72 hour(s)). No results found.     Assessment and Plan: 69 y.o. female with The encounter diagnosis was Hyperglycemia, drug-induced.  Patient counseled on use of sliding scale Humalog for duration of Prednisone taper Patient to contact office for glucose>350 Patient not to administer insulin for glucose <150 Cont current diabetes medication as  prescribed Follow-up with PCP at least every 3 months or sooner as needed  PDMP not reviewed this encounter. No orders of the defined types were placed in this encounter.  Meds ordered this encounter  Medications  . insulin lispro (HUMALOG KWIKPEN) 100 UNIT/ML KwikPen    Sig: Inject 0.03-0.12 mLs (3-12 Units total) into the skin 3 (three) times daily as needed (hyperglycemia>150). Sliding scale. Administer subQ within 15 minutes before or immediately after a meal    Dispense:  3 mL    Refill:  0    Order Specific Question:   Supervising Provider    Answer:   Emeterio Reeve [[id number]]  . DISCONTD: Insulin Pen Needle 31G X 8 MM MISC    Sig: For SQ injection of Humalog    Dispense:  30 each    Refill:  0    Order Specific Question:   Supervising Provider    Answer:   Emeterio Reeve [[id number]]   There are no Patient Instructions on file for this visit.  Instructions sent via MyChart. If MyChart not available, pt was given option for info via personal e-mail w/ no guarantee of protected health info over unsecured e-mail communication, and MyChart sign-up instructions were sent to patient.   Follow Up Instructions: No follow-ups on file.    I discussed the assessment and treatment plan with the patient. The patient was provided an opportunity to ask questions and all were answered. The patient agreed with the plan and demonstrated an understanding  of the instructions.   The patient was advised to call back or seek an in-person evaluation if any new concerns, if symptoms worsen or if the condition fails to improve as anticipated.  15 minutes of non-face-to-face time was provided during this encounter.      . . . . . . . . . . . . . [name]                   Historical information moved to improve visibility of documentation.  Past Medical History:  Diagnosis Date  . Alkaline phosphatase elevation   . Asthma   . Breast cancer ([place]) 2011  . Carpal  tunnel syndrome   . Carpal tunnel syndrome    bilateral  . CHF (congestive heart failure) ([place])   . Cirrhosis, non-alcoholic ([place])   . Closed fracture of unspecified part of radius (alone)   . DDD (degenerative disc disease), lumbar   . Depression    pt denies  . Diabetes mellitus without complication ([place])   . Enlarged heart   . Fibromyalgia   . GERD (gastroesophageal reflux disease)   . History of kidney stones   . HTN (hypertension)   . Hypothyroidism   . IBS (irritable bowel syndrome)   . LBBB (left bundle branch block)   . Microcytic anemia 05/14/2017  . Pneumonia   . Presence of permanent cardiac pacemaker   . PVD (peripheral vascular disease) ([place])    2019  . Sleep apnea    uses cpap   Past Surgical History:  Procedure Laterality Date  . BREAST LUMPECTOMY Left 2011  . COLONOSCOPY    . FOOT SURGERY    . KNEE SURGERY    . MASTECTOMY Right    05/2019  . PACEMAKER GENERATOR CHANGE N/A 08/23/2014   Procedure: PACEMAKER GENERATOR CHANGE;  Surgeon: Evans Lance, MD;  Location: [facility] CATH LAB;  Service: Cardiovascular;  Laterality: N/A;  . PACEMAKER PLACEMENT  2009   [facility]  . RECTAL SURGERY  1976  . REDUCTION MAMMAPLASTY Left    05/2019  . TOTAL SHOULDER ARTHROPLASTY Right 03/10/2018  . TOTAL SHOULDER ARTHROPLASTY Right 03/10/2018   Procedure: RIGHT TOTAL SHOULDER ARTHROPLASTY;  Surgeon: [name], MD;  Location: [facility];  Service: Orthopedics;  Laterality: Right;  . TUBAL LIGATION     Social History   Tobacco Use  . Smoking status: Former Smoker    Packs/day: 0.00    Years: 0.00    Pack years: 0.00    Quit date: 08/24/1966    Years since quitting: 53.3  . Smokeless tobacco: Never Used  Substance Use Topics  . Alcohol use: No   family history includes Breast cancer in an other family member; Heart failure in an other family member; Hypertension in an other family member; Melanoma in her mother; Parkinson's disease in her  mother.  Medications: Current Outpatient Medications  Medication Sig Dispense Refill  . Accu-Chek FastClix Lancets MISC Test twice a day. Dx:E11.8 406 each 4  . alendronate (FOSAMAX) 70 MG tablet Take 1 tablet (70 mg total) by mouth every 7 (seven) days. Take with a full glass of water on an empty stomach. 12 tablet 4  . AMBULATORY NON FORMULARY MEDICATION Medication Name: Tubing and supplies for nebulizer. Fax - [phone] 1 vial 0  . blood glucose meter kit and supplies KIT Dispense based on patient and insurance preference. 1 each 0  . bumetanide (BUMEX) 1 MG tablet Take 1 tablet (1 mg total) by  mouth 2 (two) times daily. 180 tablet 1  . BYSTOLIC 10 MG tablet TAKE 1 TABLET BY MOUTH  DAILY 90 tablet 3  . Calcium Carb-Cholecalciferol (CALCIUM 500/D) 500-400 MG-UNIT CHEW Chew 600 mg by mouth 3 (three) times daily.     [name] dicyclomine (BENTYL) 20 MG tablet Take 10-20 mg by mouth every 8 (eight) hours as needed for spasms (cramps).   11  . Lancets Misc. (ACCU-CHEK MULTICLIX LANCET DEV) KIT Test twice a day. Dx:E11.8 1 kit 0  . levalbuterol (XOPENEX) 1.25 MG/3ML nebulizer solution Take 1.25 mg by nebulization every 8 (eight) hours as needed for wheezing or shortness of breath.   2  . LINZESS 145 MCG CAPS capsule Take 145 mcg by mouth daily.    . Multiple Vitamins-Minerals (OCUVITE EYE + MULTI) TABS Take 1 tablet by mouth daily.    . Olopatadine HCl 0.2 % SOLN Place 1 drop into both eyes daily.    . pantoprazole (PROTONIX) 40 MG tablet Take 40 mg by mouth daily.    . pravastatin (PRAVACHOL) 40 MG tablet TAKE 1 TABLET BY MOUTH AT  BEDTIME 90 tablet 3  . PROAIR HFA 108 (90 Base) MCG/ACT inhaler USE 1 TO 2 INHALATIONS BY  MOUTH INTO THE LUNGS EVERY  6 HOURS AS NEEDED FOR  WHEEZING OR SHORTNESS OF  BREATH 18 g 4  . RESTASIS 0.05 % ophthalmic emulsion     . tiotropium (SPIRIVA HANDIHALER) 18 MCG inhalation capsule Spiriva with HandiHaler 18 mcg and inhalation capsules 30 capsule 0  . ADVAIR DISKUS  250-50 MCG/DOSE AEPB USE 1 INHALATION BY MOUTH  TWICE DAILY 180 each 3  . diltiazem (CARDIZEM) 120 MG tablet TAKE 1 TABLET BY MOUTH  DAILY 90 tablet 3  . EPIPEN 2-PAK 0.3 MG/0.3ML SOAJ injection INJECT 1 PEN INTO MUSCLE OF LATERAL THIGH FOR SEVERE ALLERGIC REACTION. CALL 911 AFTER USING THIS MEDICATION. 1 each 2  . glucose blood (ACCU-CHEK GUIDE) test strip Dx DM E11.8 - Check fasting blood sugar every morning and once 2 hours after largest meal of the day. 300 each prn  . insulin lispro (HUMALOG KWIKPEN) 100 UNIT/ML KwikPen Inject 0.03-0.12 mLs (3-12 Units total) into the skin 3 (three) times daily as needed (hyperglycemia>150). Sliding scale. Administer subQ within 15 minutes before or immediately after a meal 3 mL 0  . Insulin Pen Needle 31G X 8 MM MISC To be used for injecting insulin 100 each 4  . LANTUS SOLOSTAR 100 UNIT/ML Solostar Pen INJECT SUBCUTANEOUSLY 40  UNITS AT BEDTIME 45 mL 3  . levothyroxine (SYNTHROID) 75 MCG tablet TAKE 1 TABLET BY MOUTH  DAILY 90 tablet 3  . mupirocin ointment (BACTROBAN) 2 % Apply topically 2 (two) times daily. 30 g 0   No current facility-administered medications for this visit.   Allergies  Allergen Reactions  . Penicillins Anaphylaxis    Has patient had a PCN reaction causing immediate rash, facial/tongue/throat swelling, SOB or lightheadedness with hypotension: Yes Has patient had a PCN reaction causing severe rash involving mucus membranes or skin necrosis: No Has patient had a PCN reaction that required hospitalization: No Has patient had a PCN reaction occurring within the last 10 years: No If all of the above answers are "NO", then may proceed with Cephalosporin use.   [name] Hcl] Other (See Comments)    Hyperkalemia  . Atorvastatin Other (See Comments)    msucle aches  . Celebrex [Celecoxib]     ulcers  . Fluticasone Other (See Comments)  Nasal ulcer.    . Levocetirizine     Doesn't recall  . Losartan Potassium Itching   . Naproxen Swelling  . Nasonex [Mometasone Furoate] Other (See Comments)    nosebleed  . Aspirin Other (See Comments)    Pt states "burns holes in stomach", ulcers   . Baking Soda-Fluoride [Sodium Fluoride] Itching  . Clindamycin/Lincomycin Rash  . Codeine Nausea And Vomiting  . Furosemide Other (See Comments)    Nose bleed  . Iodinated Diagnostic Agents Itching and Rash    Pt states she has had ultrasound with dye but was fine  . Lactose   . Lactose Intolerance (Gi) Other (See Comments)    Increased calcium, sneezing, nasal issues  . Lanolin-Petrolatum Rash  . Metformin And Related Diarrhea  . Miralax [Polyethylene Glycol 3350]   . Miralax [Polyethylene Glycol] Rash  . Nasonex [Mometasone Furoate] Other (See Comments)  . Quinine Rash  . Rifampin Other (See Comments)    DOES NOT REMEMBER THIS MEDICATION OR ALLERGY  . Sulfa Antibiotics Rash  . Sulfonamide Derivatives Rash  . Tramadol Rash    Anxiety  . Wellbutrin [Bupropion] Other (See Comments)    Muscle aches, pain, "made my skin crawl"  . Wool Alcohol [Lanolin] Rash  . Zoledronic Acid Other (See Comments) and Rash    shaking

## 2019-08-31 NOTE — Telephone Encounter (Signed)
Tanya Hogan is currently being treated with prednisone. She called to report elevated blood sugars. She states today her blood sugar was 300 mg/dl. She wanted to know what she can do about the elevation. I asked what she has been eating for the last 2 days. I did advise her she would have to be very carefull with the amount of carbohydrates and sugars while taking the prednisone. Advised more lean meats and vegetables.    Eggs Spaghetti Pecan pie Coffee 2 biscuits sausage

## 2019-08-31 NOTE — Telephone Encounter (Signed)
Patient scheduled.

## 2019-09-02 LAB — NOVEL CORONAVIRUS, NAA: SARS-CoV-2, NAA: NOT DETECTED

## 2019-09-04 ENCOUNTER — Telehealth: Payer: Self-pay | Admitting: Medical-Surgical

## 2019-09-04 NOTE — Telephone Encounter (Signed)
Patient called and is still having some throat and it feels raw and occasional shortness of breath but not more than her normal. She reports she is using her inhalers but is not sure they are helping much. She is coughing up some "white stuff" and will take her last antibiotic tonight. She wants to know what else you recommend. Please advise.

## 2019-09-04 NOTE — Telephone Encounter (Signed)
She can always use OTC sore throat remedies such as Chloraseptic spray or Cepacol lozenges. If they don't work, we could try some viscous lidocaine. As for her shortness of breath, continue using the inhalers as needed. If she develops shortness of breath that is worse than normal, we could get a chest xray. Coughing up white stuff is okay as our body does tend to produce mucus to help protect our airways. She should still have a few days of her prednisone taper left, so finish those.

## 2019-09-04 NOTE — Telephone Encounter (Signed)
Pt aware of recommendations. She states she already finished the prednisone dose pack, that she was only given the 6 day taper pack. I called and spoke with Darius Bump at the pharmacy and verified that they only dispensed the 6 day 21-tab pack. She said that the Rx was written for the 6 day taper, the sig said for it to be the 12 day taper, and that it was written to dispense 1 tablet so they should have called for several reasons for clarification but they did not.

## 2019-09-07 ENCOUNTER — Telehealth: Payer: Self-pay | Admitting: Family Medicine

## 2019-09-07 NOTE — Telephone Encounter (Signed)
Patient called and has been taking 40 units of her bedtime insuline since she was on prednisone. Her sugars were 85 a couple of mornings ago, then it was 88 the next morning and then she took only 35 units last night and her sugar was 109. Patient wants to know if she should keep on the same dose. She is concerned that her sugar will get too low and she may black out. Please advise.

## 2019-09-07 NOTE — Telephone Encounter (Signed)
I would stay at 35 as long as sugars are under 120 and above 80.  Start getting really close to 80 then we can always drop down to 30 units.

## 2019-09-08 NOTE — Telephone Encounter (Signed)
Pt stated that she had already dropped down to taking 35 U. She informed me that yesterday her BS was 190 and today it was 101. She has been really mindful about food choices and said that she has been doing much better.   Also advised her that should her BS go below 80 to go down to 30 U. She voiced understanding and agreed.Maryruth Eve, [name], CMA

## 2019-09-13 ENCOUNTER — Other Ambulatory Visit: Payer: Self-pay

## 2019-09-13 ENCOUNTER — Ambulatory Visit (INDEPENDENT_AMBULATORY_CARE_PROVIDER_SITE_OTHER): Payer: Medicare Other

## 2019-09-13 DIAGNOSIS — Z1231 Encounter for screening mammogram for malignant neoplasm of breast: Secondary | ICD-10-CM

## 2019-09-15 ENCOUNTER — Telehealth: Payer: Self-pay

## 2019-09-15 NOTE — Telephone Encounter (Signed)
Tanya Hogan called and states she will never have another Mammogram. She states that the position she had to stand in caused horrible pain to her back and neck. She reports she has chronic pain to her back and neck and this only worsened the pain. She states "the tech was doing her job and she isn't blaming the tech. My body will not let her do this. I can't take pain medications to help with the pain." She states she will never have another mammogram.

## 2019-09-15 NOTE — Telephone Encounter (Signed)
OK, I do understand her frustration.  But I am concerned because she does have a history of breast cancer in the left breast for any possible recurrence on the opposite side.  We can discuss further at our next office visit.

## 2019-09-15 NOTE — Telephone Encounter (Signed)
I called the patient and she is aware and she reports "they are not going to happen". No other questions.

## 2019-09-16 ENCOUNTER — Other Ambulatory Visit: Payer: Self-pay | Admitting: Family Medicine

## 2019-09-27 ENCOUNTER — Other Ambulatory Visit: Payer: Self-pay | Admitting: Family Medicine

## 2019-10-02 ENCOUNTER — Ambulatory Visit (INDEPENDENT_AMBULATORY_CARE_PROVIDER_SITE_OTHER): Payer: Medicare Other | Admitting: *Deleted

## 2019-10-02 ENCOUNTER — Telehealth: Payer: Self-pay

## 2019-10-02 DIAGNOSIS — I5022 Chronic systolic (congestive) heart failure: Secondary | ICD-10-CM | POA: Diagnosis not present

## 2019-10-02 DIAGNOSIS — Z950 Presence of cardiac pacemaker: Secondary | ICD-10-CM | POA: Diagnosis not present

## 2019-10-02 NOTE — Telephone Encounter (Signed)
  The pt states she been trying to send a transmission. Her monitor goes half way then shuts off. I gave her the number to [facility] to get additional help.

## 2019-10-06 ENCOUNTER — Other Ambulatory Visit: Payer: Self-pay

## 2019-10-06 ENCOUNTER — Ambulatory Visit (INDEPENDENT_AMBULATORY_CARE_PROVIDER_SITE_OTHER): Payer: Medicare Other | Admitting: Family Medicine

## 2019-10-06 ENCOUNTER — Ambulatory Visit: Payer: Medicare Other | Admitting: Internal Medicine

## 2019-10-06 ENCOUNTER — Encounter: Payer: Self-pay | Admitting: Family Medicine

## 2019-10-06 ENCOUNTER — Encounter: Payer: Self-pay | Admitting: Internal Medicine

## 2019-10-06 ENCOUNTER — Other Ambulatory Visit: Payer: Self-pay | Admitting: Family Medicine

## 2019-10-06 VITALS — BP 138/68 | HR 71 | Ht 62.0 in | Wt 211.0 lb

## 2019-10-06 VITALS — Ht 62.0 in | Wt 216.0 lb

## 2019-10-06 DIAGNOSIS — K5909 Other constipation: Secondary | ICD-10-CM | POA: Insufficient documentation

## 2019-10-06 DIAGNOSIS — I442 Atrioventricular block, complete: Secondary | ICD-10-CM

## 2019-10-06 DIAGNOSIS — E118 Type 2 diabetes mellitus with unspecified complications: Secondary | ICD-10-CM | POA: Diagnosis not present

## 2019-10-06 DIAGNOSIS — R140 Abdominal distension (gaseous): Secondary | ICD-10-CM | POA: Diagnosis not present

## 2019-10-06 DIAGNOSIS — Z950 Presence of cardiac pacemaker: Secondary | ICD-10-CM

## 2019-10-06 DIAGNOSIS — R82998 Other abnormal findings in urine: Secondary | ICD-10-CM | POA: Diagnosis not present

## 2019-10-06 DIAGNOSIS — I5022 Chronic systolic (congestive) heart failure: Secondary | ICD-10-CM

## 2019-10-06 DIAGNOSIS — R1084 Generalized abdominal pain: Secondary | ICD-10-CM

## 2019-10-06 LAB — CUP PACEART REMOTE DEVICE CHECK
Battery Remaining Longevity: 31 mo
Battery Voltage: 2.95 V
Brady Statistic AP VP Percent: 54.1 %
Brady Statistic AP VS Percent: 0.29 %
Brady Statistic AS VP Percent: 43.88 %
Brady Statistic AS VS Percent: 1.73 %
Brady Statistic RA Percent Paced: 52.96 %
Brady Statistic RV Percent Paced: 96.08 %
Date Time Interrogation Session: 20210212094226
Implantable Lead Implant Date: 20030324
Implantable Lead Implant Date: 20030324
Implantable Lead Implant Date: 20030324
Implantable Lead Location: 753858
Implantable Lead Location: 753859
Implantable Lead Location: 753860
Implantable Lead Model: 4068
Implantable Lead Model: 4068
Implantable Lead Model: 4193
Implantable Pulse Generator Implant Date: 20151231
Lead Channel Impedance Value: 1159 Ohm
Lead Channel Impedance Value: 380 Ohm
Lead Channel Impedance Value: 399 Ohm
Lead Channel Impedance Value: 4047 Ohm
Lead Channel Impedance Value: 4047 Ohm
Lead Channel Impedance Value: 4047 Ohm
Lead Channel Impedance Value: 418 Ohm
Lead Channel Impedance Value: 551 Ohm
Lead Channel Impedance Value: 988 Ohm
Lead Channel Pacing Threshold Amplitude: 0.875 V
Lead Channel Pacing Threshold Amplitude: 2 V
Lead Channel Pacing Threshold Pulse Width: 0.4 ms
Lead Channel Pacing Threshold Pulse Width: 0.4 ms
Lead Channel Sensing Intrinsic Amplitude: 2.375 mV
Lead Channel Sensing Intrinsic Amplitude: 2.375 mV
Lead Channel Sensing Intrinsic Amplitude: 8.875 mV
Lead Channel Sensing Intrinsic Amplitude: 8.875 mV
Lead Channel Setting Pacing Amplitude: 1.5 V
Lead Channel Setting Pacing Amplitude: 2.5 V
Lead Channel Setting Pacing Amplitude: 3.5 V
Lead Channel Setting Pacing Pulse Width: 0.8 ms
Lead Channel Setting Pacing Pulse Width: 0.8 ms
Lead Channel Setting Sensing Sensitivity: 4 mV

## 2019-10-06 LAB — CUP PACEART INCLINIC DEVICE CHECK
Battery Remaining Longevity: 31 mo
Battery Voltage: 2.95 V
Brady Statistic AP VP Percent: 54.05 %
Brady Statistic AP VS Percent: 0.29 %
Brady Statistic AS VP Percent: 43.93 %
Brady Statistic AS VS Percent: 1.73 %
Brady Statistic RA Percent Paced: 52.91 %
Brady Statistic RV Percent Paced: 96.08 %
Date Time Interrogation Session: 20210212163335
Implantable Lead Implant Date: 20030324
Implantable Lead Implant Date: 20030324
Implantable Lead Implant Date: 20030324
Implantable Lead Location: 753858
Implantable Lead Location: 753859
Implantable Lead Location: 753860
Implantable Lead Model: 4068
Implantable Lead Model: 4068
Implantable Lead Model: 4193
Implantable Pulse Generator Implant Date: 20151231
Lead Channel Impedance Value: 1159 Ohm
Lead Channel Impedance Value: 380 Ohm
Lead Channel Impedance Value: 399 Ohm
Lead Channel Impedance Value: 4047 Ohm
Lead Channel Impedance Value: 4047 Ohm
Lead Channel Impedance Value: 4047 Ohm
Lead Channel Impedance Value: 418 Ohm
Lead Channel Impedance Value: 551 Ohm
Lead Channel Impedance Value: 988 Ohm
Lead Channel Pacing Threshold Amplitude: 0.75 V
Lead Channel Pacing Threshold Amplitude: 1.75 V
Lead Channel Pacing Threshold Amplitude: 1.75 V
Lead Channel Pacing Threshold Pulse Width: 0.4 ms
Lead Channel Pacing Threshold Pulse Width: 0.8 ms
Lead Channel Pacing Threshold Pulse Width: 0.8 ms
Lead Channel Sensing Intrinsic Amplitude: 1.4 mV
Lead Channel Setting Pacing Amplitude: 1.5 V
Lead Channel Setting Pacing Amplitude: 2.5 V
Lead Channel Setting Pacing Amplitude: 3.5 V
Lead Channel Setting Pacing Pulse Width: 0.8 ms
Lead Channel Setting Pacing Pulse Width: 0.8 ms
Lead Channel Setting Sensing Sensitivity: 4 mV

## 2019-10-06 LAB — POCT URINALYSIS DIP (CLINITEK)
Bilirubin, UA: NEGATIVE
Glucose, UA: NEGATIVE mg/dL
Ketones, POC UA: NEGATIVE mg/dL
Nitrite, UA: NEGATIVE
POC PROTEIN,UA: NEGATIVE
Spec Grav, UA: 1.025 (ref 1.010–1.025)
Urobilinogen, UA: 0.2 E.U./dL
pH, UA: 5.5 (ref 5.0–8.0)

## 2019-10-06 LAB — POCT UA - MICROALBUMIN
Albumin/Creatinine Ratio, Urine, POC: 30
Creatinine, POC: 200 mg/dL
Microalbumin Ur, POC: 30 mg/L

## 2019-10-06 NOTE — Progress Notes (Signed)
HPI [name] returns today for followup. She is a pleasant  69 yo woman with chronic systolic heart failure and CHB, s/p Biv PPM insertion. She has had some increased thresholds but has been stable from a cardiac perspective. She has breast CA. She is s/p surgery. She has also had a breast reduction and has had some residual drainage. No infectious symptoms.  She has class 2 CHF symptoms. No syncope.   Allergies  Allergen Reactions  . Penicillins Anaphylaxis    Has patient had a PCN reaction causing immediate rash, facial/tongue/throat swelling, SOB or lightheadedness with hypotension: Yes Has patient had a PCN reaction causing severe rash involving mucus membranes or skin necrosis: No Has patient had a PCN reaction that required hospitalization: No Has patient had a PCN reaction occurring within the last 10 years: No If all of the above answers are "NO", then may proceed with Cephalosporin use.   . Naproxen Swelling  . Accupril [Quinapril Hcl] Other (See Comments)    Hyperkalemia  . Aspirin Other (See Comments)    Pt states "burns holes in stomach", ulcers   . Atorvastatin Other (See Comments)    msucle aches  . Baking Soda-Fluoride [Sodium Fluoride] Itching  . Celebrex [Celecoxib]     ulcers  . Codeine Nausea And Vomiting  . Fluticasone Other (See Comments)    Nasal ulcer.    . Furosemide Other (See Comments)    Nose bleed  . Lactose   . Lactose Intolerance (Gi) Other (See Comments)    Increased calcium, sneezing, nasal issues  . Levocetirizine     Doesn't recall  . Losartan Potassium Itching  . Metformin And Related Diarrhea  . Miralax  [Polyethylene Glycol 3350]   . Nasonex  [Mometasone Furoate]   . Nasonex [Mometasone Furoate] Other (See Comments)    nosebleed  . Rifampin Other (See Comments)    DOES NOT REMEMBER THIS MEDICATION OR ALLERGY  . Wellbutrin [Bupropion] Other (See Comments)    Muscle aches, pain, "made my skin crawl"  . Clindamycin/Lincomycin  Rash  . Iodinated Diagnostic Agents Itching and Rash    Pt states she has had ultrasound with dye but was fine  . Lanolin-Petrolatum Rash  . Miralax [Polyethylene Glycol] Rash  . Quinine Rash  . Sulfa Antibiotics Rash  . Sulfonamide Derivatives Rash  . Tramadol Rash    Anxiety  . Wool Alcohol [Lanolin] Rash  . Zoledronic Acid Other (See Comments) and Rash    shaking     Current Outpatient Medications  Medication Sig Dispense Refill  . Accu-Chek FastClix Lancets MISC Test twice a day. Dx:E11.8 406 each 4  . ADVAIR DISKUS 250-50 MCG/DOSE AEPB Inhale 1 puff into the lungs 2 (two) times daily. 180 each 1  . alendronate (FOSAMAX) 70 MG tablet Take 1 tablet (70 mg total) by mouth every 7 (seven) days. Take with a full glass of water on an empty stomach. 12 tablet 4  . AMBULATORY NON FORMULARY MEDICATION Medication Name: Tubing and supplies for nebulizer. Fax - [phone] 1 vial 0  . blood glucose meter kit and supplies KIT Dispense based on patient and insurance preference. 1 each 0  . bumetanide (BUMEX) 1 MG tablet Take 1 tablet (1 mg total) by mouth 2 (two) times daily. 180 tablet 1  . BYSTOLIC 10 MG tablet TAKE 1 TABLET BY MOUTH  DAILY 90 tablet 3  . Calcium Carb-Cholecalciferol (CALCIUM 500/D) 500-400 MG-UNIT CHEW Chew 600 mg by mouth 3 (three)  times daily.     [name] dicyclomine (BENTYL) 20 MG tablet Take 10-20 mg by mouth every 8 (eight) hours as needed for spasms (cramps).   11  . diltiazem (CARDIZEM) 120 MG tablet TAKE 1 TABLET BY MOUTH  DAILY 90 tablet 3  . EPIPEN 2-PAK 0.3 MG/0.3ML SOAJ injection INJECT 1 PEN INTO MUSCLE OF LATERAL THIGH FOR SEVERE ALLERGIC REACTION. CALL 911 AFTER USING THIS MEDICATION. 1 each 2  . glucose blood (ACCU-CHEK AVIVA PLUS) test strip For testing blood sugars twice daily. Dx: E11.8 200 each 4  . insulin lispro (HUMALOG KWIKPEN) 100 UNIT/ML KwikPen Inject 0.03-0.12 mLs (3-12 Units total) into the skin 3 (three) times daily as needed (hyperglycemia>150).  Sliding scale. Administer subQ within 15 minutes before or immediately after a meal 3 mL 0  . Insulin Pen Needle (BD PEN NEEDLE NANO U/F) 32G X 4 MM MISC Use once daily as directed 200 each 6  . Insulin Pen Needle 31G X 8 MM MISC For SQ injection of Humalog 30 each 0  . Lancets Misc. (ACCU-CHEK MULTICLIX LANCET DEV) KIT Test twice a day. Dx:E11.8 1 kit 0  . LANTUS SOLOSTAR 100 UNIT/ML Solostar Pen INJECT SUBCUTANEOUSLY 40  UNITS AT BEDTIME 45 mL 3  . levalbuterol (XOPENEX) 1.25 MG/3ML nebulizer solution Take 1.25 mg by nebulization every 8 (eight) hours as needed for wheezing or shortness of breath.   2  . levothyroxine (SYNTHROID) 75 MCG tablet Take 1 tablet (75 mcg total) by mouth daily. 90 tablet 1  . LINZESS 145 MCG CAPS capsule Take 145 mcg by mouth daily.    . Multiple Vitamins-Minerals (OCUVITE EYE + MULTI) TABS Take 1 tablet by mouth daily.    . Olopatadine HCl 0.2 % SOLN Place 1 drop into both eyes daily.    . pantoprazole (PROTONIX) 40 MG tablet Take 40 mg by mouth daily.    . pravastatin (PRAVACHOL) 40 MG tablet TAKE 1 TABLET BY MOUTH AT  BEDTIME 90 tablet 3  . PROAIR HFA 108 (90 Base) MCG/ACT inhaler USE 1 TO 2 INHALATIONS BY  MOUTH INTO THE LUNGS EVERY  6 HOURS AS NEEDED FOR  WHEEZING OR SHORTNESS OF  BREATH 18 g 4  . RESTASIS 0.05 % ophthalmic emulsion     . tiotropium (SPIRIVA HANDIHALER) 18 MCG inhalation capsule Spiriva with HandiHaler 18 mcg and inhalation capsules 30 capsule 0   No current facility-administered medications for this visit.     Past Medical History:  Diagnosis Date  . Alkaline phosphatase elevation   . Asthma   . Breast cancer ([place]) 2011  . Carpal tunnel syndrome   . Carpal tunnel syndrome    bilateral  . CHF (congestive heart failure) ([place])   . Cirrhosis, non-alcoholic ([place])   . Closed fracture of unspecified part of radius (alone)   . DDD (degenerative disc disease), lumbar   . Depression    pt denies  . Diabetes mellitus without complication  ([place])   . Enlarged heart   . Fibromyalgia   . GERD (gastroesophageal reflux disease)   . History of kidney stones   . HTN (hypertension)   . Hypothyroidism   . IBS (irritable bowel syndrome)   . LBBB (left bundle branch block)   . Microcytic anemia 05/14/2017  . Pneumonia   . Presence of permanent cardiac pacemaker   . PVD (peripheral vascular disease) ([place])    2019  . Sleep apnea    uses cpap    ROS:   All systems  reviewed and negative except as noted in the HPI.   Past Surgical History:  Procedure Laterality Date  . BREAST LUMPECTOMY Left 2011  . COLONOSCOPY    . FOOT SURGERY    . KNEE SURGERY    . MASTECTOMY Right    05/2019  . PACEMAKER GENERATOR CHANGE N/A 08/23/2014   Procedure: PACEMAKER GENERATOR CHANGE;  Surgeon: Evans Lance, MD;  Location[facility] CATH LAB;  Service: Cardiovascular;  Laterality: N/A;  . PACEMAKER PLACEMENT  2009   [facility]  . RECTAL SURGERY  1976  . REDUCTION MAMMAPLASTY Left    05/2019  . TOTAL SHOULDER ARTHROPLASTY Right 03/10/2018  . TOTAL SHOULDER ARTHROPLASTY Right 03/10/2018   Procedure: RIGHT TOTAL SHOULDER ARTHROPLASTY;  Surgeon: [name], MD;  Location: [facility];  Service: Orthopedics;  Laterality: Right;  . TUBAL LIGATION       Family History  Problem Relation Age of Onset  . Melanoma Mother   . Parkinson's disease Mother   . Heart failure Other   . Breast cancer Other   . Hypertension Other      Social History   Socioeconomic History  . Marital status: Divorced    Spouse name: Not on file  . Number of children: 2  . Years of education: 72  . Highest education level: Associate degree: academic program  Occupational History  . 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 Smoking status: Former Smoker    Packs/day: 0.00    Years: 0.00    Pack years: 0.00    Quit date: 08/24/1966    Years since quitting: 53.1  . Smokeless tobacco: Never Used  Substance and  Sexual Activity  . Alcohol use: No  . Drug use: No  . Sexual activity: Not Currently  Other Topics Concern  . Not on file  Social History Narrative   Retired. 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 Likes Systems developer and music. Loves animals.   Social Determinants of Health   Financial Resource Strain: Low Risk   . Difficulty of Paying Living Expenses: Not hard at all  Food Insecurity: No Food Insecurity  . Worried About Charity fundraiser in the Last Year: Never true  . Ran Out of Food in the Last Year: Never true  Transportation Needs: No Transportation Needs  . Lack of Transportation (Medical): No  . Lack of Transportation (Non-Medical): No  Physical Activity: Unknown  . Days of Exercise per Week: 0 days  . Minutes of Exercise per Session: Not on file  Stress: No Stress Concern Present  . Feeling of Stress : Not at all  Social Connections: Moderately Isolated  . Frequency of Communication with Friends and Family: More than three times a week  . Frequency of Social Gatherings with Friends and Family: Never  . Attends Religious Services: Never  . Active Member of Clubs or Organizations: No  . Attends Archivist Meetings: Never  . Marital Status: Divorced  Human resources officer Violence: Not At Risk  . Fear of Current or Ex-Partner: No  . Emotionally Abused: No  . Physically Abused: No  . Sexually Abused: No     BP 138/68   Pulse 71   Ht 5' 2"  (1.575 m)   Wt 211 lb (95.7 kg)   SpO2 98%   BMI 38.59 kg/m   Physical Exam:  Well appearing NAD HEENT: Unremarkable Neck:  No JVD, no thyromegally Lymphatics:  No adenopathy Back:  No CVA tenderness Lungs:  Clear with no wheezes HEART:  Regular rate rhythm, no murmurs, no rubs, no clicks Abd:  soft, positive bowel sounds, no organomegally, no rebound, no guarding Ext:  2 plus pulses, no edema, no cyanosis, no clubbing Skin:  No rashes no nodules Neuro:  CN II through XII intact, motor grossly intact  EKG  - nsr with biv pacing  DEVICE  Normal device function.  See PaceArt for details.   Assess/Plan: 1. CHB - she is asymptomatic, s/p PPM insertion. 2. Chronic systolic heart failure - she has class 2 symptoms. Her bp is minimally elevated.  3. Biv PPM - her Medtronic device is working normally. Her RV and LV thresholds are up but stable. We will follow. 4. Obesity - she is encouraged to lose weight.  Mikle Bosworth.D.

## 2019-10-06 NOTE — Assessment & Plan Note (Addendum)
-  Likely related to her IBS. No red flags on her history or exam.  She is having normal bowel movements without nausea or significant pain so unlikely to have bowel obstruction. -She appears euvolemic on exam with other signs of CHF exacerbation so I think bowel wall edema would be less likely as well.    -UA with some leukocytes, sent for culture but denies urinary sx at this time.  -Her constipation seems well controlled with current dose of linzess.  -Unable to tolerate bentyl.  -No relief with probiotics in the past.  -She may continue gas x and/or bean-o but these are likely to only provide limited to relief for her.  -I recommended she follow up with her GI doctor to discuss ongoing symptoms as well as I think we have exhausted options in primary care for management of her symptoms.  -Unfortunately she did not seem very pleased that I was not able to offer immediate relief of her chronic symptoms.

## 2019-10-06 NOTE — Patient Instructions (Signed)
Medication Instructions:  Your physician recommends that you continue on your current medications as directed. Please refer to the Current Medication list given to you today.  Labwork: None ordered.  Testing/Procedures: None ordered.  Follow-Up: Your physician wants you to follow-up in: one year with Dr. Lovena Le.   You will receive a reminder letter in the mail two months in advance. If you don't receive a letter, please call our office to schedule the follow-up appointment.  Remote monitoring is used to monitor your Pacemaker from home. This monitoring reduces the number of office visits required to check your device to one time per year. It allows [place] to keep an eye on the functioning of your device to ensure it is working properly. You are scheduled for a device check from home on 01/01/2020. You may send your transmission at any time that day. If you have a wireless device, the transmission will be sent automatically. After your physician reviews your transmission, you will receive a postcard with your next transmission date.  Any Other Special Instructions Will Be Listed Below (If Applicable).  If you need a refill on your cardiac medications before your next appointment, please call your pharmacy.

## 2019-10-06 NOTE — Progress Notes (Signed)
Tanya Hogan - 69 y.o. female MRN [id number]  Date of birth: July 26, 1951  Subjective Chief Complaint  Patient presents with  . Bloated    HPI Tanya Hogan is a 69 y.o. female with history of multiple medical problems including IBS-C here today with complaint of abdominal bloating.  She reports that this is a chronic issue for her.  She has seen GI provider and started on linzess x3 months ago.  She reports that she was told that this would resolve her constipation and bloating.  Constipation has improved and she is having regular bowel movements but continues to have issues with bloating.  Her last BM was this morning and was normal.  Her appetite has been normal.  She denies abdominal pain, nausea or vomiting.  She has not had fever.   She has tried to resolve this with gas-x and bean-o, started last night.  She has not had much relief with this yet.  She has tried probiotics in the past without improvement.  She does also have rx for bentyl but reports that this causes her to feel too drowsy so she doesn't like taking it.   ROS:  A comprehensive ROS was completed and negative except as noted per HPI  Allergies  Allergen Reactions  . Penicillins Anaphylaxis    Has patient had a PCN reaction causing immediate rash, facial/tongue/throat swelling, SOB or lightheadedness with hypotension: Yes Has patient had a PCN reaction causing severe rash involving mucus membranes or skin necrosis: No Has patient had a PCN reaction that required hospitalization: No Has patient had a PCN reaction occurring within the last 10 years: No If all of the above answers are "NO", then may proceed with Cephalosporin use.   Tanya Hogan Hcl] Other (See Comments)    Hyperkalemia  . Atorvastatin Other (See Comments)    msucle aches  . Celebrex [Celecoxib]     ulcers  . Fluticasone Other (See Comments)    Nasal ulcer.    . Levocetirizine     Doesn't recall  . Losartan Potassium Itching  . Naproxen  Swelling  . Nasonex [Mometasone Furoate] Other (See Comments)    nosebleed  . Aspirin Other (See Comments)    Pt states "burns holes in stomach", ulcers   . Baking Soda-Fluoride [Sodium Fluoride] Itching  . Clindamycin/Lincomycin Rash  . Codeine Nausea And Vomiting  . Furosemide Other (See Comments)    Nose bleed  . Iodinated Diagnostic Agents Itching and Rash    Pt states she has had ultrasound with dye but was fine  . Lactose   . Lactose Intolerance (Gi) Other (See Comments)    Increased calcium, sneezing, nasal issues  . Lanolin-Petrolatum Rash  . Metformin And Related Diarrhea  . Miralax [Polyethylene Glycol 3350]   . Miralax [Polyethylene Glycol] Rash  . Nasonex [Mometasone Furoate] Other (See Comments)  . Quinine Rash  . Rifampin Other (See Comments)    DOES NOT REMEMBER THIS MEDICATION OR ALLERGY  . Sulfa Antibiotics Rash  . Sulfonamide Derivatives Rash  . Tramadol Rash    Anxiety  . Wellbutrin [Bupropion] Other (See Comments)    Muscle aches, pain, "made my skin crawl"  . Wool Alcohol [Lanolin] Rash  . Zoledronic Acid Other (See Comments) and Rash    shaking    Past Medical History:  Diagnosis Date  . Alkaline phosphatase elevation   . Asthma   . Breast cancer ([place]) 2011  . Carpal tunnel syndrome   . Carpal tunnel  syndrome    bilateral  . CHF (congestive heart failure) ([place])   . Cirrhosis, non-alcoholic ([place])   . Closed fracture of unspecified part of radius (alone)   . DDD (degenerative disc disease), lumbar   . Depression    pt denies  . Diabetes mellitus without complication ([place])   . Enlarged heart   . Fibromyalgia   . GERD (gastroesophageal reflux disease)   . History of kidney stones   . HTN (hypertension)   . Hypothyroidism   . IBS (irritable bowel syndrome)   . LBBB (left bundle branch block)   . Microcytic anemia 05/14/2017  . Pneumonia   . Presence of permanent cardiac pacemaker   . PVD (peripheral vascular disease) ([place])    2019  . Sleep  apnea    uses cpap    Past Surgical History:  Procedure Laterality Date  . BREAST LUMPECTOMY Left 2011  . COLONOSCOPY    . FOOT SURGERY    . KNEE SURGERY    . MASTECTOMY Right    05/2019  . PACEMAKER GENERATOR CHANGE N/A 08/23/2014   Procedure: PACEMAKER GENERATOR CHANGE;  Surgeon: Evans Lance, MD;  Location: [facility] CATH LAB;  Service: Cardiovascular;  Laterality: N/A;  . PACEMAKER PLACEMENT  2009   [place] [facility]  . RECTAL SURGERY  1976  . REDUCTION MAMMAPLASTY Left    05/2019  . TOTAL SHOULDER ARTHROPLASTY Right 03/10/2018  . TOTAL SHOULDER ARTHROPLASTY Right 03/10/2018   Procedure: RIGHT TOTAL SHOULDER ARTHROPLASTY;  Surgeon: [name], MD;  Location: [facility];  Service: Orthopedics;  Laterality: Right;  . TUBAL LIGATION      Social History   Socioeconomic History  . Marital status: Divorced    Spouse name: Not on file  . Number of children: 2  . Years of education: 79  . Highest education level: Associate degree: academic program  Occupational History  . 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 Smoking status: Former Smoker    Packs/day: 0.00    Years: 0.00    Pack years: 0.00    Quit date: 08/24/1966    Years since quitting: 53.1  . Smokeless tobacco: Never Used  Substance and Sexual Activity  . Alcohol use: No  . Drug use: No  . Sexual activity: Not Currently  Other Topics Concern  . Not on file  Social History Narrative   Retired. 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 Likes Systems developer and music. Loves animals.   Social Determinants of Health   Financial Resource Strain: Low Risk   . Difficulty of Paying Living Expenses: Not hard at all  Food Insecurity: No Food Insecurity  . Worried About Charity fundraiser in the Last Year: Never true  . Ran Out of Food in the Last Year: Never true  Transportation Needs: No Transportation Needs  . Lack of Transportation (Medical): No  . Lack of  Transportation (Non-Medical): No  Physical Activity: Unknown  . Days of Exercise per Week: 0 days  . Minutes of Exercise per Session: Not on file  Stress: No Stress Concern Present  . Feeling of Stress : Not at all  Social Connections: Moderately Isolated  . Frequency of Communication with Friends and Family: More than three times a week  . Frequency of Social Gatherings with Friends and Family: Never  . Attends Religious Services: Never  . Active Member of Clubs or Organizations: No  . Attends Archivist Meetings: Never  .  Marital Status: Divorced    Family History  Problem Relation Age of Onset  . Melanoma Mother   . Parkinson's disease Mother   . Heart failure Other   . Breast cancer Other   . Hypertension Other     Health Maintenance  Topic Date Due  . OPHTHALMOLOGY EXAM  04/08/2019  . COLONOSCOPY  06/13/2019  . FOOT EXAM  08/26/2019  . HEMOGLOBIN A1C  01/09/2020  . URINE MICROALBUMIN  10/05/2020  . MAMMOGRAM  09/12/2021  . TETANUS/TDAP  07/14/2022  . INFLUENZA VACCINE  Completed  . DEXA SCAN  Completed  . Hepatitis C Screening  Completed  . PNA vac Low Risk Adult  Completed    ----------------------------------------------------------------------------------------------------------------------------------------------------------------------------------------------------------------- Physical Exam Ht 5' 2"  (1.575 m)   Wt 216 lb (98 kg)   BMI 39.51 kg/m   Physical Exam Constitutional:      Appearance: Normal appearance.  HENT:     Head: Normocephalic and atraumatic.  Eyes:     General: No scleral icterus. Cardiovascular:     Rate and Rhythm: Normal rate and regular rhythm.  Pulmonary:     Effort: Pulmonary effort is normal.     Breath sounds: Normal breath sounds.  Abdominal:     General: Abdomen is flat. Bowel sounds are normal. There is no distension.     Palpations: Abdomen is soft.     Tenderness: There is no abdominal tenderness. There is  no guarding.  Skin:    General: Skin is warm and dry.  Neurological:     General: No focal deficit present.     Mental Status: She is alert.  Psychiatric:        Mood and Affect: Mood normal.        Behavior: Behavior normal.     ------------------------------------------------------------------------------------------------------------------------------------------------------------------------------------------------------------------- Assessment and Plan  Abdominal bloating -Likely related to her IBS. No red flags on her history or exam.  She is having normal bowel movements without nausea or significant pain so unlikely to have bowel obstruction. -She appears euvolemic on exam with other signs of CHF exacerbation so I think bowel wall edema would be less likely as well.    -UA with some leukocytes, sent for culture but denies urinary sx at this time.  -Her constipation seems well controlled with current dose of linzess.  -Unable to tolerate bentyl.  -No relief with probiotics in the past.  -She may continue gas x and/or bean-o but these are likely to only provide limited to relief for her.  -I recommended she follow up with her GI doctor to discuss ongoing symptoms as well as I think we have exhausted options in primary care for management of her symptoms.  -Unfortunately she did not seem very pleased that I was not able to offer immediate relief of her chronic symptoms.    35 minutes spent including pre visit preparation, review of prior notes and labs, face to face encounter with patient, counseling  and same day documentation.   This visit occurred during the SARS-CoV-2 public health emergency.  Safety protocols were in place, including screening questions prior to the visit, additional usage of staff PPE, and extensive cleaning of exam room while observing appropriate contact time as indicated for disinfecting solutions.

## 2019-10-06 NOTE — Progress Notes (Signed)
PPM Remote  

## 2019-10-06 NOTE — Patient Instructions (Signed)
Very nice to meet you today.  You may continue gas-x and/or bean-o over the weekend.  Please schedule follow up with your GI specialist.

## 2019-10-09 ENCOUNTER — Other Ambulatory Visit: Payer: Self-pay

## 2019-10-09 LAB — URINE CULTURE
MICRO NUMBER:: 10148196
SPECIMEN QUALITY:: ADEQUATE

## 2019-10-09 LAB — CLIENT EDUCATION TRACKING

## 2019-10-09 LAB — HOUSE ACCOUNT TRACKING

## 2019-10-09 MED ORDER — ACCU-CHEK GUIDE VI STRP: ORAL_STRIP | 99 refills | Status: DC

## 2019-10-12 ENCOUNTER — Ambulatory Visit: Payer: Medicare Other | Admitting: Family Medicine

## 2019-10-16 ENCOUNTER — Ambulatory Visit: Payer: Medicare Other

## 2019-10-20 ENCOUNTER — Encounter: Payer: Self-pay | Admitting: Family Medicine

## 2019-10-20 ENCOUNTER — Other Ambulatory Visit: Payer: Self-pay

## 2019-10-20 ENCOUNTER — Ambulatory Visit (INDEPENDENT_AMBULATORY_CARE_PROVIDER_SITE_OTHER): Payer: Medicare Other | Admitting: Family Medicine

## 2019-10-20 VITALS — BP 135/76 | HR 83 | Ht 62.0 in | Wt 213.0 lb

## 2019-10-20 DIAGNOSIS — T50905A Adverse effect of unspecified drugs, medicaments and biological substances, initial encounter: Secondary | ICD-10-CM

## 2019-10-20 DIAGNOSIS — J4540 Moderate persistent asthma, uncomplicated: Secondary | ICD-10-CM

## 2019-10-20 DIAGNOSIS — R739 Hyperglycemia, unspecified: Secondary | ICD-10-CM

## 2019-10-20 DIAGNOSIS — L089 Local infection of the skin and subcutaneous tissue, unspecified: Secondary | ICD-10-CM

## 2019-10-20 DIAGNOSIS — I10 Essential (primary) hypertension: Secondary | ICD-10-CM | POA: Diagnosis not present

## 2019-10-20 DIAGNOSIS — K7581 Nonalcoholic steatohepatitis (NASH): Secondary | ICD-10-CM | POA: Diagnosis not present

## 2019-10-20 DIAGNOSIS — E118 Type 2 diabetes mellitus with unspecified complications: Secondary | ICD-10-CM | POA: Diagnosis not present

## 2019-10-20 DIAGNOSIS — R140 Abdominal distension (gaseous): Secondary | ICD-10-CM

## 2019-10-20 LAB — POCT GLYCOSYLATED HEMOGLOBIN (HGB A1C): Hemoglobin A1C: 6.1 % — AB (ref 4.0–5.6)

## 2019-10-20 MED ORDER — MUPIROCIN 2 % EX OINT: TOPICAL_OINTMENT | Freq: Two times a day (BID) | CUTANEOUS | 0 refills | Status: DC

## 2019-10-20 MED ORDER — INSULIN PEN NEEDLE 31G X 8 MM MISC: 4 refills | Status: DC

## 2019-10-20 NOTE — Progress Notes (Signed)
Established Patient Office Visit  Subjective:  Patient ID: Tanya Hogan, female    DOB: Aug 11, 1951  Age: 69 y.o. MRN: [id number]  CC:  Chief Complaint  Patient presents with  . Diabetes    HPI Tanya Hogan presents for   Hypertension- Pt denies chest pain, SOB, dizziness, or heart palpitations.  Taking meds as directed w/o problems.  Denies medication side effects.    F/U Asthma - she is doing OK.  No recent flares.    Constipation-started on Linzess in December and is doing well overall.  She does have concerned knowing that she has problems with severe gas.  She says she does not drink soda etc. but has noticed that it has gotten some better on Linzess but still a little bit problematic.  She also wanted to let me know that she was bleeding a little bit after her mammogram of her breast.  She just noticed it was a little itchy and irritated.  Diabetes - no hypoglycemic events. No wounds or sores that are not healing well. No increased thirst or urination. Checking glucose at home. Taking medications as prescribed without any side effects.   Past Medical History:  Diagnosis Date  . Alkaline phosphatase elevation   . Asthma   . Breast cancer ([place]) 2011  . Carpal tunnel syndrome   . Carpal tunnel syndrome    bilateral  . CHF (congestive heart failure) ([place])   . Cirrhosis, non-alcoholic ([place])   . Closed fracture of unspecified part of radius (alone)   . DDD (degenerative disc disease), lumbar   . Depression    pt denies  . Diabetes mellitus without complication ([place])   . Enlarged heart   . Fibromyalgia   . GERD (gastroesophageal reflux disease)   . History of kidney stones   . HTN (hypertension)   . Hypothyroidism   . IBS (irritable bowel syndrome)   . LBBB (left bundle branch block)   . Microcytic anemia 05/14/2017  . Pneumonia   . Presence of permanent cardiac pacemaker   . PVD (peripheral vascular disease) ([place])    2019  . Sleep apnea    uses cpap     Past Surgical History:  Procedure Laterality Date  . BREAST LUMPECTOMY Left 2011  . COLONOSCOPY    . FOOT SURGERY    . KNEE SURGERY    . MASTECTOMY Right    05/2019  . PACEMAKER GENERATOR CHANGE N/A 08/23/2014   Procedure: PACEMAKER GENERATOR CHANGE;  Surgeon: Evans Lance, MD;  Location: [facility] CATH LAB;  Service: Cardiovascular;  Laterality: N/A;  . PACEMAKER PLACEMENT  2009   [facility]  . RECTAL SURGERY  1976  . REDUCTION MAMMAPLASTY Left    05/2019  . TOTAL SHOULDER ARTHROPLASTY Right 03/10/2018  . TOTAL SHOULDER ARTHROPLASTY Right 03/10/2018   Procedure: RIGHT TOTAL SHOULDER ARTHROPLASTY;  Surgeon: [name], MD;  Location: [facility];  Service: Orthopedics;  Laterality: Right;  . TUBAL LIGATION      Family History  Problem Relation Age of Onset  . Melanoma Mother   . Parkinson's disease Mother   . Heart failure Other   . Breast cancer Other   . Hypertension Other     Social History   Socioeconomic History  . Marital status: Divorced    Spouse name: Not on file  . Number of children: 2  . Years of education: 29  . Highest education level: Associate degree: academic program  Occupational History  . Occupation: quality  insurcance in [employer]    Comment: retired  Tobacco Use  . Smoking status: Former Smoker    Packs/day: 0.00    Years: 0.00    Pack years: 0.00    Quit date: 08/24/1966    Years since quitting: 53.2  . Smokeless tobacco: Never Used  Substance and Sexual Activity  . Alcohol use: No  . Drug use: No  . Sexual activity: Not Currently  Other Topics Concern  . Not on file  Social History Narrative   Retired. 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 Likes Systems developer and music. Loves animals.   Social Determinants of Health   Financial Resource Strain:   . Difficulty of Paying Living Expenses: Not on file  Food Insecurity:   . Worried About Charity fundraiser in the Last Year: Not on file  . Ran Out of Food in the Last  Year: Not on file  Transportation Needs:   . Lack of Transportation (Medical): Not on file  . Lack of Transportation (Non-Medical): Not on file  Physical Activity:   . Days of Exercise per Week: Not on file  . Minutes of Exercise per Session: Not on file  Stress:   . Feeling of Stress : Not on file  Social Connections:   . Frequency of Communication with Friends and Family: Not on file  . Frequency of Social Gatherings with Friends and Family: Not on file  . Attends Religious Services: Not on file  . Active Member of Clubs or Organizations: Not on file  . Attends Archivist Meetings: Not on file  . Marital Status: Not on file  Intimate Partner Violence:   . Fear of Current or Ex-Partner: Not on file  . Emotionally Abused: Not on file  . Physically Abused: Not on file  . Sexually Abused: Not on file    Outpatient Medications Prior to Visit  Medication Sig Dispense Refill  . Accu-Chek FastClix Lancets MISC Test twice a day. Dx:E11.8 406 each 4  . ADVAIR DISKUS 250-50 MCG/DOSE AEPB Inhale 1 puff into the lungs 2 (two) times daily. 180 each 1  . alendronate (FOSAMAX) 70 MG tablet Take 1 tablet (70 mg total) by mouth every 7 (seven) days. Take with a full glass of water on an empty stomach. 12 tablet 4  . AMBULATORY NON FORMULARY MEDICATION Medication Name: Tubing and supplies for nebulizer. Fax - [phone] 1 vial 0  . blood glucose meter kit and supplies KIT Dispense based on patient and insurance preference. 1 each 0  . bumetanide (BUMEX) 1 MG tablet Take 1 tablet (1 mg total) by mouth 2 (two) times daily. 180 tablet 1  . BYSTOLIC 10 MG tablet TAKE 1 TABLET BY MOUTH  DAILY 90 tablet 3  . Calcium Carb-Cholecalciferol (CALCIUM 500/D) 500-400 MG-UNIT CHEW Chew 600 mg by mouth 3 (three) times daily.     [name] dicyclomine (BENTYL) 20 MG tablet Take 10-20 mg by mouth every 8 (eight) hours as needed for spasms (cramps).   11  . diltiazem (CARDIZEM) 120 MG tablet TAKE 1 TABLET BY  MOUTH  DAILY 90 tablet 3  . EPIPEN 2-PAK 0.3 MG/0.3ML SOAJ injection INJECT 1 PEN INTO MUSCLE OF LATERAL THIGH FOR SEVERE ALLERGIC REACTION. CALL 911 AFTER USING THIS MEDICATION. 1 each 2  . glucose blood (ACCU-CHEK GUIDE) test strip Dx DM E11.8 - Check fasting blood sugar every morning and once 2 hours after largest meal of the day. 300 each prn  . insulin lispro (  HUMALOG KWIKPEN) 100 UNIT/ML KwikPen Inject 0.03-0.12 mLs (3-12 Units total) into the skin 3 (three) times daily as needed (hyperglycemia>150). Sliding scale. Administer subQ within 15 minutes before or immediately after a meal 3 mL 0  . Lancets Misc. (ACCU-CHEK MULTICLIX LANCET DEV) KIT Test twice a day. Dx:E11.8 1 kit 0  . LANTUS SOLOSTAR 100 UNIT/ML Solostar Pen INJECT SUBCUTANEOUSLY 40  UNITS AT BEDTIME 45 mL 3  . levalbuterol (XOPENEX) 1.25 MG/3ML nebulizer solution Take 1.25 mg by nebulization every 8 (eight) hours as needed for wheezing or shortness of breath.   2  . levothyroxine (SYNTHROID) 75 MCG tablet Take 1 tablet (75 mcg total) by mouth daily. 90 tablet 1  . LINZESS 145 MCG CAPS capsule Take 145 mcg by mouth daily.    . Multiple Vitamins-Minerals (OCUVITE EYE + MULTI) TABS Take 1 tablet by mouth daily.    . Olopatadine HCl 0.2 % SOLN Place 1 drop into both eyes daily.    . pantoprazole (PROTONIX) 40 MG tablet Take 40 mg by mouth daily.    . pravastatin (PRAVACHOL) 40 MG tablet TAKE 1 TABLET BY MOUTH AT  BEDTIME 90 tablet 3  . PROAIR HFA 108 (90 Base) MCG/ACT inhaler USE 1 TO 2 INHALATIONS BY  MOUTH INTO THE LUNGS EVERY  6 HOURS AS NEEDED FOR  WHEEZING OR SHORTNESS OF  BREATH 18 g 4  . RESTASIS 0.05 % ophthalmic emulsion     . tiotropium (SPIRIVA HANDIHALER) 18 MCG inhalation capsule Spiriva with HandiHaler 18 mcg and inhalation capsules 30 capsule 0  . Insulin Pen Needle 31G X 8 MM MISC For SQ injection of Humalog 30 each 0  . Insulin Pen Needle (BD PEN NEEDLE NANO U/F) 32G X 4 MM MISC Use once daily as directed 200 each 6    No facility-administered medications prior to visit.    Allergies  Allergen Reactions  . Penicillins Anaphylaxis    Has patient had a PCN reaction causing immediate rash, facial/tongue/throat swelling, SOB or lightheadedness with hypotension: Yes Has patient had a PCN reaction causing severe rash involving mucus membranes or skin necrosis: No Has patient had a PCN reaction that required hospitalization: No Has patient had a PCN reaction occurring within the last 10 years: No If all of the above answers are "NO", then may proceed with Cephalosporin use.   [name] Hcl] Other (See Comments)    Hyperkalemia  . Atorvastatin Other (See Comments)    msucle aches  . Celebrex [Celecoxib]     ulcers  . Fluticasone Other (See Comments)    Nasal ulcer.    . Levocetirizine     Doesn't recall  . Losartan Potassium Itching  . Naproxen Swelling  . Nasonex [Mometasone Furoate] Other (See Comments)    nosebleed  . Aspirin Other (See Comments)    Pt states "burns holes in stomach", ulcers   . Baking Soda-Fluoride [Sodium Fluoride] Itching  . Clindamycin/Lincomycin Rash  . Codeine Nausea And Vomiting  . Furosemide Other (See Comments)    Nose bleed  . Iodinated Diagnostic Agents Itching and Rash    Pt states she has had ultrasound with dye but was fine  . Lactose   . Lactose Intolerance (Gi) Other (See Comments)    Increased calcium, sneezing, nasal issues  . Lanolin-Petrolatum Rash  . Metformin And Related Diarrhea  . Miralax [Polyethylene Glycol 3350]   . Miralax [Polyethylene Glycol] Rash  . Nasonex [Mometasone Furoate] Other (See Comments)  . Quinine Rash  .  Rifampin Other (See Comments)    DOES NOT REMEMBER THIS MEDICATION OR ALLERGY  . Sulfa Antibiotics Rash  . Sulfonamide Derivatives Rash  . Tramadol Rash    Anxiety  . Wellbutrin [Bupropion] Other (See Comments)    Muscle aches, pain, "made my skin crawl"  . Wool Alcohol [Lanolin] Rash  . Zoledronic Acid  Other (See Comments) and Rash    shaking    ROS Review of Systems    Objective:    Physical Exam  Constitutional: She is oriented to person, place, and time. She appears well-developed and well-nourished.  HENT:  Head: Normocephalic and atraumatic.  Cardiovascular: Normal rate, regular rhythm and normal heart sounds.  Pulmonary/Chest: Effort normal and breath sounds normal.  Neurological: She is alert and oriented to person, place, and time.  Skin: Skin is warm and dry.  Small scab on the breast.  Appears to be healing well. No drainage from wound.    Psychiatric: She has a normal mood and affect. Her behavior is normal.    BP 135/76   Pulse 83   Ht 5' 2"  (1.575 m)   Wt 213 lb (96.6 kg)   SpO2 98%   BMI 38.96 kg/m  Wt Readings from Last 3 Encounters:  10/20/19 213 lb (96.6 kg)  10/06/19 216 lb (98 kg)  10/06/19 211 lb (95.7 kg)     Health Maintenance Due  Topic Date Due  . OPHTHALMOLOGY EXAM  04/08/2019  . COLONOSCOPY  06/13/2019  . FOOT EXAM  08/26/2019    There are no preventive care reminders to display for this patient.  Lab Results  Component Value Date   TSH 2.44 04/11/2019   Lab Results  Component Value Date   WBC 6.5 03/14/2019   HGB 14.1 03/14/2019   HCT 42.7 03/14/2019   MCV 92.0 03/14/2019   PLT 287 03/14/2019   Lab Results  Component Value Date   NA 139 10/20/2019   K 4.2 10/20/2019   CHLORIDE 105 10/30/2014   CO2 25 10/20/2019   GLUCOSE 102 (H) 10/20/2019   BUN 17 10/20/2019   CREATININE 0.82 10/20/2019   BILITOT 0.3 10/20/2019   ALKPHOS 100 03/02/2018   AST 19 10/20/2019   ALT 15 10/20/2019   PROT 6.4 10/20/2019   ALBUMIN 4.0 03/02/2018   CALCIUM 10.6 (H) 10/20/2019   ANIONGAP 8 03/11/2018   EGFR 78 (L) 10/30/2014   Lab Results  Component Value Date   CHOL 164 04/11/2019   Lab Results  Component Value Date   HDL 68 04/11/2019   Lab Results  Component Value Date   LDLCALC 76 04/11/2019   Lab Results  Component Value  Date   TRIG 121 04/11/2019   Lab Results  Component Value Date   CHOLHDL 2.4 04/11/2019   Lab Results  Component Value Date   HGBA1C 6.1 (A) 10/20/2019      Assessment & Plan:   Problem List Items Addressed This Visit      Cardiovascular and Mediastinum   Benign essential hypertension    Well controlled. Continue current regimen. Follow up in  5mo       Respiratory   Asthma, moderate persistent    Stable.         Digestive   NASH (nonalcoholic steatohepatitis)    1 liver enzymes every 6 months.  Also follows with digestive health.      Relevant Orders   COMPLETE METABOLIC PANEL WITH GFR (Completed)     Endocrine  Controlled diabetes mellitus type 2 with complications (HCC) - Primary    A1C looks great at 6.1  Continue current regimen.        Relevant Orders   POCT glycosylated hemoglobin (Hb A1C) (Completed)     Other   Abdominal bloating    Doing better since has started Linzess.         Other Visit Diagnoses    Hyperglycemia, drug-induced       Relevant Medications   Insulin Pen Needle 31G X 8 MM MISC   Skin infection       Relevant Medications   mupirocin ointment (BACTROBAN) 2 %     Breast bleeding after mammogram. No active bleeding today. Apply Mupiricin BID to moisturize and monitor for any redness.    Meds ordered this encounter  Medications  . Insulin Pen Needle 31G X 8 MM MISC    Sig: To be used for injecting insulin    Dispense:  100 each    Refill:  4  . mupirocin ointment (BACTROBAN) 2 %    Sig: Apply topically 2 (two) times daily.    Dispense:  30 g    Refill:  0    Follow-up: Return in about 2 weeks (around 11/03/2019) for address additional problems .    Beatrice Lecher, MD

## 2019-10-21 LAB — COMPLETE METABOLIC PANEL WITH GFR
AG Ratio: 1.9 (calc) (ref 1.0–2.5)
ALT: 15 U/L (ref 6–29)
AST: 19 U/L (ref 10–35)
Albumin: 4.2 g/dL (ref 3.6–5.1)
Alkaline phosphatase (APISO): 88 U/L (ref 37–153)
BUN: 17 mg/dL (ref 7–25)
CO2: 25 mmol/L (ref 20–32)
Calcium: 10.6 mg/dL — ABNORMAL HIGH (ref 8.6–10.4)
Chloride: 105 mmol/L (ref 98–110)
Creat: 0.82 mg/dL (ref 0.60–0.93)
GFR, Est African American: 84 mL/min/{1.73_m2} (ref >=60)
GFR, Est Non African American: 72 mL/min/{1.73_m2} (ref >=60)
Globulin: 2.2 g/dL (calc) (ref 1.9–3.7)
Glucose, Bld: 102 mg/dL — ABNORMAL HIGH (ref 65–99)
Potassium: 4.2 mmol/L (ref 3.5–5.3)
Sodium: 139 mmol/L (ref 135–146)
Total Bilirubin: 0.3 mg/dL (ref 0.2–1.2)
Total Protein: 6.4 g/dL (ref 6.1–8.1)

## 2019-10-23 NOTE — Progress Notes (Signed)
All labs are normal. 

## 2019-10-24 ENCOUNTER — Encounter: Payer: Self-pay | Admitting: Family Medicine

## 2019-10-24 DIAGNOSIS — G4733 Obstructive sleep apnea (adult) (pediatric): Secondary | ICD-10-CM | POA: Diagnosis not present

## 2019-10-24 DIAGNOSIS — G4737 Central sleep apnea in conditions classified elsewhere: Secondary | ICD-10-CM | POA: Diagnosis not present

## 2019-10-24 NOTE — Assessment & Plan Note (Signed)
Doing better since has started Linzess.

## 2019-10-24 NOTE — Assessment & Plan Note (Signed)
A1C looks great at 6.1  Continue current regimen.

## 2019-10-24 NOTE — Assessment & Plan Note (Signed)
1 liver enzymes every 6 months.  Also follows with digestive health.

## 2019-10-24 NOTE — Assessment & Plan Note (Signed)
Well controlled. Continue current regimen. Follow up in  6 mo  

## 2019-10-24 NOTE — Assessment & Plan Note (Signed)
Stable

## 2019-10-25 ENCOUNTER — Other Ambulatory Visit: Payer: Self-pay | Admitting: Internal Medicine

## 2019-10-25 ENCOUNTER — Other Ambulatory Visit: Payer: Self-pay | Admitting: Family Medicine

## 2019-11-06 ENCOUNTER — Encounter: Payer: Self-pay | Admitting: Family Medicine

## 2019-11-06 ENCOUNTER — Other Ambulatory Visit: Payer: Self-pay

## 2019-11-06 ENCOUNTER — Ambulatory Visit (INDEPENDENT_AMBULATORY_CARE_PROVIDER_SITE_OTHER): Payer: Medicare Other

## 2019-11-06 ENCOUNTER — Ambulatory Visit (INDEPENDENT_AMBULATORY_CARE_PROVIDER_SITE_OTHER): Payer: Medicare Other | Admitting: Family Medicine

## 2019-11-06 VITALS — BP 137/55 | HR 70 | Ht 62.0 in | Wt 215.0 lb

## 2019-11-06 DIAGNOSIS — E118 Type 2 diabetes mellitus with unspecified complications: Secondary | ICD-10-CM

## 2019-11-06 DIAGNOSIS — F439 Reaction to severe stress, unspecified: Secondary | ICD-10-CM

## 2019-11-06 DIAGNOSIS — L720 Epidermal cyst: Secondary | ICD-10-CM

## 2019-11-06 DIAGNOSIS — M79672 Pain in left foot: Secondary | ICD-10-CM

## 2019-11-06 DIAGNOSIS — M542 Cervicalgia: Secondary | ICD-10-CM

## 2019-11-06 NOTE — Progress Notes (Signed)
Established Patient Office Visit  Subjective:  Patient ID: Tanya Hogan, female    DOB: 1950-11-27  Age: 69 y.o. MRN: [id number]  CC:  Chief Complaint  Patient presents with  . Follow-up    HPI [name] presents for   Spot on scalp - she says it not tenderness but would like me to look at it today.    5th toe pain - she reports pain since had callous shaved by podiatry, Dr. Mallie Mussel.  She says is been very tender and sore ever since then she does remember any trauma or injury. No wounds, etc. Doesn't remember any recent trauma.    Diabetes - no hypoglycemic events. No wounds or sores that are not healing well. No increased thirst or urination. Checking glucose at home. Taking medications as prescribed without any side effects. She would like to attend diabetic classes.    She lives with her brother and this has been really stressful for her. She feels he has been playing trick on her and he is not doing any work around the house so feels like she is having to carry the burden of housework.  She feels she cannot sustain this.    Past Medical History:  Diagnosis Date  . Alkaline phosphatase elevation   . Asthma   . Breast cancer ([place]) 2011  . Carpal tunnel syndrome   . Carpal tunnel syndrome    bilateral  . CHF (congestive heart failure) ([place])   . Cirrhosis, non-alcoholic ([place])   . Closed fracture of unspecified part of radius (alone)   . DDD (degenerative disc disease), lumbar   . Depression    pt denies  . Diabetes mellitus without complication ([place])   . Enlarged heart   . Fibromyalgia   . GERD (gastroesophageal reflux disease)   . History of kidney stones   . HTN (hypertension)   . Hypothyroidism   . IBS (irritable bowel syndrome)   . LBBB (left bundle branch block)   . Microcytic anemia 05/14/2017  . Pneumonia   . Presence of permanent cardiac pacemaker   . PVD (peripheral vascular disease) ([place])    2019  . Sleep apnea    uses cpap    Past Surgical  History:  Procedure Laterality Date  . BREAST LUMPECTOMY Left 2011  . COLONOSCOPY    . FOOT SURGERY    . KNEE SURGERY    . MASTECTOMY Right    05/2019  . PACEMAKER GENERATOR CHANGE N/A 08/23/2014   Procedure: PACEMAKER GENERATOR CHANGE;  Surgeon: Evans Lance, MD;  Location[facility] C[facility] LAB;  Service: Cardiovascular;  Laterality: N/A;  . PACEMAKER PLACEMENT  2009   [place] [facility]  . RECTAL SURGERY  1976  . REDUCTION MAMMAPLASTY Left    05/2019  . TOTAL SHOULDER ARTHROPLASTY Right 03/10/2018  . TOTAL SHOULDER ARTHROPLASTY Right 03/10/2018   Procedure: RIGHT TOTAL SHOULDER ARTHROPLASTY;  Surgeon: [name], MD;  Location: [facility];  Service: Orthopedics;  Laterality: Right;  . TUBAL LIGATION      Family History  Problem Relation Age of Onset  . Melanoma Mother   . Parkinson's disease Mother   . Heart failure Other   . Breast cancer Other   . Hypertension Other     Social History   Socioeconomic History  . Marital status: Divorced    Spouse name: Not on file  . Number of children: 2  . Years of education: 37  . Highest education level: Associate degree: academic program  Occupational History  . 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 Smoking status: Former Smoker    Packs/day: 0.00    Years: 0.00    Pack years: 0.00    Quit date: 08/24/1966    Years since quitting: 53.2  . Smokeless tobacco: Never Used  Substance and Sexual Activity  . Alcohol use: No  . Drug use: No  . Sexual activity: Not Currently  Other Topics Concern  . Not on file  Social History Narrative   Retired. 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 Likes Systems developer and music. Loves animals.   Social Determinants of Health   Financial Resource Strain:   . Difficulty of Paying Living Expenses:   Food Insecurity:   . Worried About Charity fundraiser in the Last Year:   . Arboriculturist in the Last Year:   Transportation Needs:   . Consulting civil engineer (Medical):   [name] Lack of Transportation (Non-Medical):   Physical Activity:   . Days of Exercise per Week:   . Minutes of Exercise per Session:   Stress:   . Feeling of Stress :   Social Connections:   . Frequency of Communication with Friends and Family:   . Frequency of Social Gatherings with Friends and Family:   . Attends Religious Services:   . Active Member of Clubs or Organizations:   . Attends Archivist Meetings:   [name] Marital Status:   Intimate Partner Violence:   . Fear of Current or Ex-Partner:   . Emotionally Abused:   [name] Physically Abused:   . Sexually Abused:     Outpatient Medications Prior to Visit  Medication Sig Dispense Refill  . Accu-Chek FastClix Lancets MISC Test twice a day. Dx:E11.8 406 each 4  . ADVAIR DISKUS 250-50 MCG/DOSE AEPB Inhale 1 puff into the lungs 2 (two) times daily. 180 each 1  . alendronate (FOSAMAX) 70 MG tablet Take 1 tablet (70 mg total) by mouth every 7 (seven) days. Take with a full glass of water on an empty stomach. 12 tablet 4  . AMBULATORY NON FORMULARY MEDICATION Medication Name: Tubing and supplies for nebulizer. Fax - [phone] 1 vial 0  . blood glucose meter kit and supplies KIT Dispense based on patient and insurance preference. 1 each 0  . bumetanide (BUMEX) 1 MG tablet Take 1 tablet (1 mg total) by mouth 2 (two) times daily. 180 tablet 1  . BYSTOLIC 10 MG tablet TAKE 1 TABLET BY MOUTH  DAILY 90 tablet 3  . Calcium Carb-Cholecalciferol (CALCIUM 500/D) 500-400 MG-UNIT CHEW Chew 600 mg by mouth 3 (three) times daily.     [name] dicyclomine (BENTYL) 20 MG tablet Take 10-20 mg by mouth every 8 (eight) hours as needed for spasms (cramps).   11  . diltiazem (CARDIZEM) 120 MG tablet TAKE 1 TABLET BY MOUTH  DAILY 90 tablet 3  . EPIPEN 2-PAK 0.3 MG/0.3ML SOAJ injection INJECT 1 PEN INTO MUSCLE OF LATERAL THIGH FOR SEVERE ALLERGIC REACTION. CALL 911 AFTER USING THIS MEDICATION. 1 each 2  . glucose blood (ACCU-CHEK  GUIDE) test strip Dx DM E11.8 - Check fasting blood sugar every morning and once 2 hours after largest meal of the day. 300 each prn  . insulin lispro (HUMALOG KWIKPEN) 100 UNIT/ML KwikPen Inject 0.03-0.12 mLs (3-12 Units total) into the skin 3 (three) times daily as needed (hyperglycemia>150). Sliding scale. Administer subQ within 15 minutes before or  immediately after a meal 3 mL 0  . Insulin Pen Needle 31G X 8 MM MISC To be used for injecting insulin 100 each 4  . Lancets Misc. (ACCU-CHEK MULTICLIX LANCET DEV) KIT Test twice a day. Dx:E11.8 1 kit 0  . LANTUS SOLOSTAR 100 UNIT/ML Solostar Pen INJECT SUBCUTANEOUSLY 40  UNITS AT BEDTIME 45 mL 3  . levalbuterol (XOPENEX) 1.25 MG/3ML nebulizer solution Take 1.25 mg by nebulization every 8 (eight) hours as needed for wheezing or shortness of breath.   2  . levothyroxine (SYNTHROID) 75 MCG tablet TAKE 1 TABLET BY MOUTH  DAILY 90 tablet 3  . LINZESS 145 MCG CAPS capsule Take 145 mcg by mouth daily.    . Multiple Vitamins-Minerals (OCUVITE EYE + MULTI) TABS Take 1 tablet by mouth daily.    . mupirocin ointment (BACTROBAN) 2 % Apply topically 2 (two) times daily. 30 g 0  . Olopatadine HCl 0.2 % SOLN Place 1 drop into both eyes daily.    . pantoprazole (PROTONIX) 40 MG tablet Take 40 mg by mouth daily.    . pravastatin (PRAVACHOL) 40 MG tablet TAKE 1 TABLET BY MOUTH AT  BEDTIME 90 tablet 3  . PROAIR HFA 108 (90 Base) MCG/ACT inhaler USE 1 TO 2 INHALATIONS BY  MOUTH INTO THE LUNGS EVERY  6 HOURS AS NEEDED FOR  WHEEZING OR SHORTNESS OF  BREATH 18 g 4  . RESTASIS 0.05 % ophthalmic emulsion     . tiotropium (SPIRIVA HANDIHALER) 18 MCG inhalation capsule Spiriva with HandiHaler 18 mcg and inhalation capsules 30 capsule 0   No facility-administered medications prior to visit.    Allergies  Allergen Reactions  . Penicillins Anaphylaxis    Has patient had a PCN reaction causing immediate rash, facial/tongue/throat swelling, SOB or lightheadedness with  hypotension: Yes Has patient had a PCN reaction causing severe rash involving mucus membranes or skin necrosis: No Has patient had a PCN reaction that required hospitalization: No Has patient had a PCN reaction occurring within the last 10 years: No If all of the above answers are "NO", then may proceed with Cephalosporin use.   [name] Hcl] Other (See Comments)    Hyperkalemia  . Atorvastatin Other (See Comments)    msucle aches  . Celebrex [Celecoxib]     ulcers  . Fluticasone Other (See Comments)    Nasal ulcer.    . Levocetirizine     Doesn't recall  . Losartan Potassium Itching  . Naproxen Swelling  . Nasonex [Mometasone Furoate] Other (See Comments)    nosebleed  . Aspirin Other (See Comments)    Pt states "burns holes in stomach", ulcers   . Baking Soda-Fluoride [Sodium Fluoride] Itching  . Clindamycin/Lincomycin Rash  . Codeine Nausea And Vomiting  . Furosemide Other (See Comments)    Nose bleed  . Iodinated Diagnostic Agents Itching and Rash    Pt states she has had ultrasound with dye but was fine  . Lactose   . Lactose Intolerance (Gi) Other (See Comments)    Increased calcium, sneezing, nasal issues  . Lanolin-Petrolatum Rash  . Metformin And Related Diarrhea  . Miralax [Polyethylene Glycol 3350]   . Miralax [Polyethylene Glycol] Rash  . Nasonex [Mometasone Furoate] Other (See Comments)  . Quinine Rash  . Rifampin Other (See Comments)    DOES NOT REMEMBER THIS MEDICATION OR ALLERGY  . Sulfa Antibiotics Rash  . Sulfonamide Derivatives Rash  . Tramadol Rash    Anxiety  . Wellbutrin [Bupropion] Other (  See Comments)    Muscle aches, pain, "made my skin crawl"  . Wool Alcohol [Lanolin] Rash  . Zoledronic Acid Other (See Comments) and Rash    shaking    ROS Review of Systems    Objective:    Physical Exam  BP (!) 137/55   Pulse 70   Ht 5' 2"  (1.575 m)   Wt 215 lb (97.5 kg)   SpO2 99%   BMI 39.32 kg/m  Wt Readings from Last 3  Encounters:  11/06/19 215 lb (97.5 kg)  10/20/19 213 lb (96.6 kg)  10/06/19 216 lb (98 kg)     Health Maintenance Due  Topic Date Due  . OPHTHALMOLOGY EXAM  04/08/2019  . COLONOSCOPY  06/13/2019  . FOOT EXAM  08/26/2019    There are no preventive care reminders to display for this patient.  Lab Results  Component Value Date   TSH 2.44 04/11/2019   Lab Results  Component Value Date   WBC 6.5 03/14/2019   HGB 14.1 03/14/2019   HCT 42.7 03/14/2019   MCV 92.0 03/14/2019   PLT 287 03/14/2019   Lab Results  Component Value Date   NA 139 10/20/2019   K 4.2 10/20/2019   CHLORIDE 105 10/30/2014   CO2 25 10/20/2019   GLUCOSE 102 (H) 10/20/2019   BUN 17 10/20/2019   CREATININE 0.82 10/20/2019   BILITOT 0.3 10/20/2019   ALKPHOS 100 03/02/2018   AST 19 10/20/2019   ALT 15 10/20/2019   PROT 6.4 10/20/2019   ALBUMIN 4.0 03/02/2018   CALCIUM 10.6 (H) 10/20/2019   ANIONGAP 8 03/11/2018   EGFR 78 (L) 10/30/2014   Lab Results  Component Value Date   CHOL 164 04/11/2019   Lab Results  Component Value Date   HDL 68 04/11/2019   Lab Results  Component Value Date   LDLCALC 76 04/11/2019   Lab Results  Component Value Date   TRIG 121 04/11/2019   Lab Results  Component Value Date   CHOLHDL 2.4 04/11/2019   Lab Results  Component Value Date   HGBA1C 6.1 (A) 10/20/2019      Assessment & Plan:   Problem List Items Addressed This Visit      Endocrine   Controlled diabetes mellitus type 2 with complications ([place])    At goal. Will refer for diabetic classes.   Lab Results  Component Value Date   HGBA1C 6.1 (A) 10/20/2019         Relevant Orders   Ambulatory referral to diabetic education    Other Visit Diagnoses    Left foot pain    -  Primary   Relevant Orders   DG Foot Complete Left (Completed)   Epidermal cyst       Cervical pain       Stress at home          Epidermal cyst-gave reassurance that it is benign.  It is not bothering her right now  so we will just continue to monitor if at any point it gets larger then we can always remove it.  Left foot pain-we will get x-ray to evaluate for possible fracture.  She thinks she did have an old injury in that foot.  I think she would benefit from a postop shoe at least for couple of weeks for support.  Ice as needed.  Stress home - we discussed setting expectatons with her brother or looking into hiring someone to do some of the work if he is  not willing to do so.     No orders of the defined types were placed in this encounter.   Follow-up: Return in about 2 months (around 01/06/2020).   Time spent in encounter 35 minutes   Beatrice Lecher, MD

## 2019-11-10 ENCOUNTER — Encounter: Payer: Self-pay | Admitting: Family Medicine

## 2019-11-10 ENCOUNTER — Other Ambulatory Visit: Payer: Self-pay | Admitting: Sports Medicine

## 2019-11-10 ENCOUNTER — Ambulatory Visit (INDEPENDENT_AMBULATORY_CARE_PROVIDER_SITE_OTHER): Payer: Medicare Other | Admitting: Sports Medicine

## 2019-11-10 DIAGNOSIS — M5030 Other cervical disc degeneration, unspecified cervical region: Secondary | ICD-10-CM | POA: Diagnosis not present

## 2019-11-10 DIAGNOSIS — E118 Type 2 diabetes mellitus with unspecified complications: Secondary | ICD-10-CM | POA: Diagnosis not present

## 2019-11-10 NOTE — Assessment & Plan Note (Signed)
Returns, she is a 69 year old female with cervical spondylosis, C5-C6 cervical DDD with left-sided neck pain, we have been holding off on cervical epidurals due to her diabetes, at this point oral analgesics are not effective, and neither her neuropathic agents will be agreed to proceed with left C6-C7 interlaminar epidural, she does have a sliding scale rapid acting insulin that she can use to keep her blood sugars down in the meantime, but this will be through her PCP. Return to see me 1 month after injection.

## 2019-11-10 NOTE — Patient Instructions (Signed)
DIABETIC SLIDING SCALE Check blood sugar 15 minutes before a meal IF BLOOD SUGAR IS:   LESS THAN 150 (NO INSULIN)   151-200 (3 UNITS)   201-250 (5 UNITS)   251-300 (8 UNITS)   301-350 (10 UNITS)   >350 - 12 UNITS and Contact Dr/Office

## 2019-11-10 NOTE — Assessment & Plan Note (Signed)
At goal. Will refer for diabetic classes.   Lab Results  Component Value Date   HGBA1C 6.1 (A) 10/20/2019

## 2019-11-10 NOTE — Progress Notes (Signed)
    Procedures performed today:    None.  Independent interpretation of notes and tests performed by another provider:   None.  Impression and Recommendations:    DDD (degenerative disc disease), cervical Returns, she is a 69 year old female with cervical spondylosis, C5-C6 cervical DDD with left-sided neck pain, we have been holding off on cervical epidurals due to her diabetes, at this point oral analgesics are not effective, and neither her neuropathic agents will be agreed to proceed with left C6-C7 interlaminar epidural, she does have a sliding scale rapid acting insulin that she can use to keep her blood sugars down in the meantime, but this will be through her PCP. Return to see me 1 month after injection.  Controlled diabetes mellitus type 2 with complications (HCC) D0V has been looking good at 6.1% from earlier this month. I have copied her sliding scale regimen for use after steroid injections from her visit with Nelson Chimes, PA-C. She can refer to this for the first month after her epidural.    ___________________________________________ [name]. [name], M.D., ABFM., CAQSM. Primary Care and [place] Instructor of [place] [facility]

## 2019-11-10 NOTE — Assessment & Plan Note (Signed)
A1c has been looking good at 6.1% from earlier this month. I have copied her sliding scale regimen for use after steroid injections from her visit with Nelson Chimes, PA-C. She can refer to this for the first month after her epidural.

## 2019-11-28 ENCOUNTER — Other Ambulatory Visit: Payer: Self-pay | Admitting: Family Medicine

## 2019-11-28 DIAGNOSIS — J4540 Moderate persistent asthma, uncomplicated: Secondary | ICD-10-CM

## 2019-11-30 DIAGNOSIS — R140 Abdominal distension (gaseous): Secondary | ICD-10-CM | POA: Diagnosis not present

## 2019-11-30 DIAGNOSIS — Z86010 Personal history of colonic polyps: Secondary | ICD-10-CM | POA: Diagnosis not present

## 2019-11-30 DIAGNOSIS — K5909 Other constipation: Secondary | ICD-10-CM | POA: Diagnosis not present

## 2019-12-08 ENCOUNTER — Other Ambulatory Visit: Payer: Self-pay

## 2019-12-08 ENCOUNTER — Ambulatory Visit
Admission: RE | Admit: 2019-12-08 | Discharge: 2019-12-08 | Disposition: A | Discharge status: Home / Self Care | Payer: Medicare Other | Source: Ambulatory Visit | Attending: Sports Medicine | Admitting: Sports Medicine

## 2019-12-08 ENCOUNTER — Other Ambulatory Visit: Payer: Medicare Other

## 2019-12-08 DIAGNOSIS — M542 Cervicalgia: Secondary | ICD-10-CM | POA: Diagnosis not present

## 2019-12-08 DIAGNOSIS — M5030 Other cervical disc degeneration, unspecified cervical region: Secondary | ICD-10-CM

## 2019-12-08 MED ORDER — TRIAMCINOLONE ACETONIDE 40 MG/ML IJ SUSP (RADIOLOGY)
60.0000 mg | Freq: Once | INTRAMUSCULAR | Status: AC
  Administered 2019-12-08: 60 mg via EPIDURAL

## 2019-12-08 MED ORDER — IOPAMIDOL (ISOVUE-M 300) INJECTION 61%
1.0000 mL | Freq: Once | INTRAMUSCULAR | Status: AC | PRN
  Administered 2019-12-08: 1 mL via EPIDURAL

## 2019-12-08 NOTE — Discharge Instructions (Signed)

## 2019-12-11 ENCOUNTER — Other Ambulatory Visit: Payer: Self-pay

## 2019-12-11 DIAGNOSIS — R739 Hyperglycemia, unspecified: Secondary | ICD-10-CM

## 2019-12-11 MED ORDER — INSULIN LISPRO (1 UNIT DIAL) 100 UNIT/ML (KWIKPEN): 3.0000 [IU] | PEN_INJECTOR | Freq: Three times a day (TID) | SUBCUTANEOUS | 2 refills | Status: DC | PRN

## 2019-12-24 ENCOUNTER — Other Ambulatory Visit: Payer: Self-pay | Admitting: Family Medicine

## 2019-12-27 ENCOUNTER — Other Ambulatory Visit: Payer: Self-pay | Admitting: *Deleted

## 2019-12-27 ENCOUNTER — Other Ambulatory Visit: Payer: Self-pay | Admitting: Family Medicine

## 2019-12-27 DIAGNOSIS — K0510 Chronic gingivitis, plaque induced: Secondary | ICD-10-CM

## 2019-12-27 DIAGNOSIS — D0511 Intraductal carcinoma in situ of right breast: Secondary | ICD-10-CM | POA: Diagnosis not present

## 2019-12-27 DIAGNOSIS — T50905A Adverse effect of unspecified drugs, medicaments and biological substances, initial encounter: Secondary | ICD-10-CM

## 2019-12-27 DIAGNOSIS — E118 Type 2 diabetes mellitus with unspecified complications: Secondary | ICD-10-CM | POA: Diagnosis not present

## 2019-12-27 DIAGNOSIS — R739 Hyperglycemia, unspecified: Secondary | ICD-10-CM

## 2019-12-27 DIAGNOSIS — J4541 Moderate persistent asthma with (acute) exacerbation: Secondary | ICD-10-CM | POA: Diagnosis not present

## 2019-12-27 DIAGNOSIS — I5022 Chronic systolic (congestive) heart failure: Secondary | ICD-10-CM | POA: Diagnosis not present

## 2019-12-27 DIAGNOSIS — K7581 Nonalcoholic steatohepatitis (NASH): Secondary | ICD-10-CM | POA: Diagnosis not present

## 2019-12-27 DIAGNOSIS — G4733 Obstructive sleep apnea (adult) (pediatric): Secondary | ICD-10-CM | POA: Diagnosis not present

## 2019-12-27 DIAGNOSIS — E039 Hypothyroidism, unspecified: Secondary | ICD-10-CM | POA: Diagnosis not present

## 2019-12-27 MED ORDER — INSULIN PEN NEEDLE 31G X 8 MM MISC
4 refills | Status: DC
Start: 2019-12-27 — End: 2021-05-26

## 2019-12-27 NOTE — Telephone Encounter (Signed)
Not written in 1 year and it was written by Dr Georgina Snell once and Dr T once  Note to PCP   Please advise if OK for refill, RX pended

## 2020-01-01 ENCOUNTER — Ambulatory Visit (INDEPENDENT_AMBULATORY_CARE_PROVIDER_SITE_OTHER): Payer: Medicare Other | Admitting: *Deleted

## 2020-01-01 DIAGNOSIS — I5022 Chronic systolic (congestive) heart failure: Secondary | ICD-10-CM

## 2020-01-01 DIAGNOSIS — I442 Atrioventricular block, complete: Secondary | ICD-10-CM | POA: Diagnosis not present

## 2020-01-01 LAB — CUP PACEART REMOTE DEVICE CHECK
Battery Remaining Longevity: 28 mo
Battery Voltage: 2.94 V
Brady Statistic AP VP Percent: 53.8 %
Brady Statistic AP VS Percent: 0.53 %
Brady Statistic AS VP Percent: 44.54 %
Brady Statistic AS VS Percent: 1.13 %
Brady Statistic RA Percent Paced: 53.19 %
Brady Statistic RV Percent Paced: 96.86 %
Date Time Interrogation Session: 20210510124439
Implantable Lead Implant Date: 20030324
Implantable Lead Implant Date: 20030324
Implantable Lead Implant Date: 20030324
Implantable Lead Location: 753858
Implantable Lead Location: 753859
Implantable Lead Location: 753860
Implantable Lead Model: 4068
Implantable Lead Model: 4068
Implantable Lead Model: 4193
Implantable Pulse Generator Implant Date: 20151231
Lead Channel Impedance Value: 1007 Ohm
Lead Channel Impedance Value: 1197 Ohm
Lead Channel Impedance Value: 4047 Ohm
Lead Channel Impedance Value: 4047 Ohm
Lead Channel Impedance Value: 4047 Ohm
Lead Channel Impedance Value: 418 Ohm
Lead Channel Impedance Value: 418 Ohm
Lead Channel Impedance Value: 418 Ohm
Lead Channel Impedance Value: 589 Ohm
Lead Channel Pacing Threshold Amplitude: 1 V
Lead Channel Pacing Threshold Amplitude: 1.75 V
Lead Channel Pacing Threshold Pulse Width: 0.4 ms
Lead Channel Pacing Threshold Pulse Width: 0.4 ms
Lead Channel Sensing Intrinsic Amplitude: 1.75 mV
Lead Channel Sensing Intrinsic Amplitude: 1.75 mV
Lead Channel Sensing Intrinsic Amplitude: 6.875 mV
Lead Channel Sensing Intrinsic Amplitude: 6.875 mV
Lead Channel Setting Pacing Amplitude: 1.5 V
Lead Channel Setting Pacing Amplitude: 2.5 V
Lead Channel Setting Pacing Amplitude: 3.5 V
Lead Channel Setting Pacing Pulse Width: 0.8 ms
Lead Channel Setting Pacing Pulse Width: 0.8 ms
Lead Channel Setting Sensing Sensitivity: 4 mV

## 2020-01-02 NOTE — Progress Notes (Signed)
Remote pacemaker transmission.   

## 2020-01-03 DIAGNOSIS — M79671 Pain in right foot: Secondary | ICD-10-CM | POA: Diagnosis not present

## 2020-01-03 DIAGNOSIS — L84 Corns and callosities: Secondary | ICD-10-CM | POA: Diagnosis not present

## 2020-01-03 DIAGNOSIS — E119 Type 2 diabetes mellitus without complications: Secondary | ICD-10-CM | POA: Diagnosis not present

## 2020-01-03 DIAGNOSIS — M2041 Other hammer toe(s) (acquired), right foot: Secondary | ICD-10-CM | POA: Diagnosis not present

## 2020-01-03 DIAGNOSIS — M2042 Other hammer toe(s) (acquired), left foot: Secondary | ICD-10-CM | POA: Diagnosis not present

## 2020-01-04 ENCOUNTER — Ambulatory Visit: Payer: Medicare Other | Admitting: Family Medicine

## 2020-01-08 ENCOUNTER — Encounter: Payer: Self-pay | Admitting: Sports Medicine

## 2020-01-08 ENCOUNTER — Ambulatory Visit (INDEPENDENT_AMBULATORY_CARE_PROVIDER_SITE_OTHER): Payer: Medicare Other | Admitting: Sports Medicine

## 2020-01-08 ENCOUNTER — Other Ambulatory Visit: Payer: Self-pay

## 2020-01-08 DIAGNOSIS — M5030 Other cervical disc degeneration, unspecified cervical region: Secondary | ICD-10-CM

## 2020-01-08 NOTE — Assessment & Plan Note (Signed)
Tanya Hogan returns, she is a 69 year old female with cervical spondylosis at C5-C6 noted on CT scan, she cannot have MRIs. We have been holding off on injections, she has intolerances to multiple medications. We ultimately proceeded with a cervical epidural, she returns today not having gotten any relief. She is not a surgical candidate. She declines medications in the antiepileptic and antidepressant class, but she is interested in considering Lyrica, she will let me know if she would like to try it. We would likely start at 25 mg 3 times daily. At this point she is no longer a candidate for other neuropathic agents, surgery, injections, she will do her home rehab but understands that we have reached the limit of what modern medicine has to offer with the exception of alternative techniques such as chiropractic manipulation and acupuncture. Return to see me on an as-needed basis.

## 2020-01-08 NOTE — Progress Notes (Signed)
    Procedures performed today:    None.  Independent interpretation of notes and tests performed by another provider:   None.  Brief History, Exam, Impression, and Recommendations:    DDD (degenerative disc disease), cervical Almyra Free returns, she is a 69 year old female with cervical spondylosis at C5-C6 noted on CT scan, she cannot have MRIs. We have been holding off on injections, she has intolerances to multiple medications. We ultimately proceeded with a cervical epidural, she returns today not having gotten any relief. She is not a surgical candidate. She declines medications in the antiepileptic and antidepressant class, but she is interested in considering Lyrica, she will let me know if she would like to try it. We would likely start at 25 mg 3 times daily. At this point she is no longer a candidate for other neuropathic agents, surgery, injections, she will do her home rehab but understands that we have reached the limit of what modern medicine has to offer with the exception of alternative techniques such as chiropractic manipulation and acupuncture. Return to see me on an as-needed basis.    ___________________________________________ [name]. [name], M.D., ABFM., CAQSM. Primary Care and [place] Instructor of [place] [facility]

## 2020-01-09 ENCOUNTER — Other Ambulatory Visit: Payer: Self-pay

## 2020-01-09 ENCOUNTER — Ambulatory Visit (INDEPENDENT_AMBULATORY_CARE_PROVIDER_SITE_OTHER): Payer: Medicare Other | Admitting: Family Medicine

## 2020-01-09 VITALS — BP 122/53 | HR 62 | Ht 62.0 in | Wt 210.0 lb

## 2020-01-09 DIAGNOSIS — I10 Essential (primary) hypertension: Secondary | ICD-10-CM

## 2020-01-09 DIAGNOSIS — E038 Other specified hypothyroidism: Secondary | ICD-10-CM

## 2020-01-09 DIAGNOSIS — J4540 Moderate persistent asthma, uncomplicated: Secondary | ICD-10-CM

## 2020-01-09 DIAGNOSIS — M79672 Pain in left foot: Secondary | ICD-10-CM | POA: Diagnosis not present

## 2020-01-09 NOTE — Assessment & Plan Note (Signed)
Due to recheck thyroid level.  Is been almost a year.  He denies any recent symptoms change.

## 2020-01-09 NOTE — Progress Notes (Addendum)
Established Patient Office Visit  Subjective:  Patient ID: Tanya Hogan, female    DOB: 1951/02/09  Age: 69 y.o. MRN: [id number]  CC:  Chief Complaint  Patient presents with  . Follow-up    HPI Tanya Hogan presents for follow-up.     Hypothyroidism - Taking medication regularly in the AM away from food and vitamins, etc. No recent change to skin, hair, or energy levels.  Follow-up diabetes-she did want to let me know that she did get an appointment with the nutritionist.  She said she went for appointment and the person was not there was evidently a lot of confusion she says that they told her that she did not show up twice.  Nonetheless it got scheduled in [place] instead of here locally which I am not sure why.  Left foot pain-she did follow-up with podiatry she says that she was told that there is some arthritis and scar tissue and they have referred her for what sounds like either custom shoes and or orthotics.  She also wanted to know what to do about the hole in her nose.  Evidently it has been there for years and was actually followed by ENT, Dr. Dimas Millin and [facility] for years but has not been back for about 3 years.  She recently evidently had some dental work and the dentist evidently put the needle through the hole not realizing that it was there.  She is concerned that the hole may actually be getting larger.   Past Medical History:  Diagnosis Date  . Alkaline phosphatase elevation   . Asthma   . Breast cancer ([place]) 2011  . Carpal tunnel syndrome   . Carpal tunnel syndrome    bilateral  . CHF (congestive heart failure) ([place])   . Cirrhosis, non-alcoholic ([place])   . Closed fracture of unspecified part of radius (alone)   . DDD (degenerative disc disease), lumbar   . Depression    pt denies  . Diabetes mellitus without complication ([place])   . Enlarged heart   . Fibromyalgia   . GERD (gastroesophageal reflux disease)   . History of kidney stones   . HTN  (hypertension)   . Hypothyroidism   . IBS (irritable bowel syndrome)   . LBBB (left bundle branch block)   . Microcytic anemia 05/14/2017  . Pneumonia   . Presence of permanent cardiac pacemaker   . PVD (peripheral vascular disease) ([place])    2019  . Sleep apnea    uses cpap    Past Surgical History:  Procedure Laterality Date  . BREAST LUMPECTOMY Left 2011  . COLONOSCOPY    . FOOT SURGERY    . KNEE SURGERY    . MASTECTOMY Right    05/2019  . PACEMAKER GENERATOR CHANGE N/A 08/23/2014   Procedure: PACEMAKER GENERATOR CHANGE;  Surgeon: Evans Lance, MD;  Location: [facility] CATH LAB;  Service: Cardiovascular;  Laterality: N/A;  . PACEMAKER PLACEMENT  2009   [facility]  . RECTAL SURGERY  1976  . REDUCTION MAMMAPLASTY Left    05/2019  . TOTAL SHOULDER ARTHROPLASTY Right 03/10/2018  . TOTAL SHOULDER ARTHROPLASTY Right 03/10/2018   Procedure: RIGHT TOTAL SHOULDER ARTHROPLASTY;  Surgeon: [name], MD;  Location: [facility];  Service: Orthopedics;  Laterality: Right;  . TUBAL LIGATION      Family History  Problem Relation Age of Onset  . Melanoma Mother   . Parkinson's disease Mother   . Heart failure Other   . Breast  cancer Other   . Hypertension Other     Social History   Socioeconomic History  . Marital status: Divorced    Spouse name: Not on file  . Number of children: 2  . Years of education: 75  . Highest education level: Associate degree: academic program  Occupational History  . 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 Smoking status: Former Smoker    Packs/day: 0.00    Years: 0.00    Pack years: 0.00    Quit date: 08/24/1966    Years since quitting: 53.4  . Smokeless tobacco: Never Used  Substance and Sexual Activity  . Alcohol use: No  . Drug use: No  . Sexual activity: Not Currently  Other Topics Concern  . Not on file  Social History Narrative   Retired. 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  Likes Systems developer and music. Loves animals.   Social Determinants of Health   Financial Resource Strain:   . Difficulty of Paying Living Expenses:   Food Insecurity:   . Worried About Charity fundraiser in the Last Year:   . Arboriculturist in the Last Year:   Transportation Needs:   . Film/video editor (Medical):   [name] Lack of Transportation (Non-Medical):   Physical Activity:   . Days of Exercise per Week:   . Minutes of Exercise per Session:   Stress:   . Feeling of Stress :   Social Connections:   . Frequency of Communication with Friends and Family:   . Frequency of Social Gatherings with Friends and Family:   . Attends Religious Services:   . Active Member of Clubs or Organizations:   . Attends Archivist Meetings:   [name] Marital Status:   Intimate Partner Violence:   . Fear of Current or Ex-Partner:   . Emotionally Abused:   [name] Physically Abused:   . Sexually Abused:     Outpatient Medications Prior to Visit  Medication Sig Dispense Refill  . cetirizine (ZYRTEC) 10 MG tablet Take 10 mg by mouth daily.    . valACYclovir (VALTREX) 1000 MG tablet TAKE 1 TABLET BY MOUTH TWICE DAILY 14 tablet 2  . ADVAIR DISKUS 250-50 MCG/DOSE AEPB USE 1 INHALATION BY MOUTH  TWICE DAILY 180 each 3  . alendronate (FOSAMAX) 70 MG tablet Take 1 tablet (70 mg total) by mouth every 7 (seven) days. Take with a full glass of water on an empty stomach. 12 tablet 4  . AMBULATORY NON FORMULARY MEDICATION Medication Name: Tubing and supplies for nebulizer. Fax - [phone] 1 vial 0  . blood glucose meter kit and supplies KIT Dispense based on patient and insurance preference. 1 each 0  . bumetanide (BUMEX) 1 MG tablet TAKE 1 TABLET BY MOUTH  TWICE DAILY 180 tablet 3  . BYSTOLIC 10 MG tablet TAKE 1 TABLET BY MOUTH  DAILY 90 tablet 3  . dicyclomine (BENTYL) 20 MG tablet Take by mouth.    . diltiazem (CARDIZEM) 120 MG tablet TAKE 1 TABLET BY MOUTH  DAILY 90 tablet 3  . EPIPEN 2-PAK  0.3 MG/0.3ML SOAJ injection INJECT 1 PEN INTO MUSCLE OF LATERAL THIGH FOR SEVERE ALLERGIC REACTION. CALL 911 AFTER USING THIS MEDICATION. 1 each 2  . glucose blood (ACCU-CHEK GUIDE) test strip Dx DM E11.8 - Check fasting blood sugar every morning and once 2 hours after largest meal of the day. 300 each prn  . insulin  lispro (HUMALOG KWIKPEN) 100 UNIT/ML KwikPen Inject 0.03-0.12 mLs (3-12 Units total) into the skin 3 (three) times daily as needed (hyperglycemia>150). Sliding scale. Administer subQ within 15 minutes before or immediately after a meal 9 mL 2  . Insulin Pen Needle 31G X 8 MM MISC To be used for injecting insulin Dx: E11.8 300 each 4  . Lancets Misc. (ACCU-CHEK MULTICLIX LANCET DEV) KIT Test twice a day. Dx:E11.8 1 kit 0  . LANTUS SOLOSTAR 100 UNIT/ML Solostar Pen INJECT SUBCUTANEOUSLY 40  UNITS AT BEDTIME 45 mL 3  . levothyroxine (SYNTHROID) 75 MCG tablet TAKE 1 TABLET BY MOUTH  DAILY 90 tablet 3  . LINZESS 145 MCG CAPS capsule Take 145 mcg by mouth daily.    . Multiple Vitamins-Minerals (OCUVITE EYE + MULTI) TABS Take 1 tablet by mouth daily.    . Olopatadine HCl 0.2 % SOLN Place 1 drop into both eyes daily.    . pantoprazole (PROTONIX) 40 MG tablet Take 40 mg by mouth daily.    . pravastatin (PRAVACHOL) 40 MG tablet TAKE 1 TABLET BY MOUTH AT  BEDTIME 90 tablet 3  . PROAIR HFA 108 (90 Base) MCG/ACT inhaler USE 1 TO 2 INHALATIONS BY  MOUTH INTO THE LUNGS EVERY  6 HOURS AS NEEDED FOR  WHEEZING OR SHORTNESS OF  BREATH 18 g 4  . RESTASIS 0.05 % ophthalmic emulsion     . tiotropium (SPIRIVA HANDIHALER) 18 MCG inhalation capsule Spiriva with HandiHaler 18 mcg and inhalation capsules 30 capsule 0  . Accu-Chek FastClix Lancets MISC Test twice a day. Dx:E11.8 406 each 4  . levalbuterol (XOPENEX) 1.25 MG/3ML nebulizer solution Take 1.25 mg by nebulization every 8 (eight) hours as needed for wheezing or shortness of breath.   2  . mupirocin ointment (BACTROBAN) 2 % Apply topically 2 (two) times  daily. 30 g 0   No facility-administered medications prior to visit.    Allergies  Allergen Reactions  . Penicillins Anaphylaxis    Has patient had a PCN reaction causing immediate rash, facial/tongue/throat swelling, SOB or lightheadedness with hypotension: Yes Has patient had a PCN reaction causing severe rash involving mucus membranes or skin necrosis: No Has patient had a PCN reaction that required hospitalization: No Has patient had a PCN reaction occurring within the last 10 years: No If all of the above answers are "NO", then may proceed with Cephalosporin use.   [name] Hcl] Other (See Comments)    Hyperkalemia  . Atorvastatin Other (See Comments)    msucle aches  . Celebrex [Celecoxib]     ulcers  . Fluticasone Other (See Comments)    Nasal ulcer.    . Levocetirizine     Doesn't recall  . Losartan Potassium Itching  . Naproxen Swelling  . Nasonex [Mometasone Furoate] Other (See Comments)    nosebleed  . Aspirin Other (See Comments)    Pt states "burns holes in stomach", ulcers   . Baking Soda-Fluoride [Sodium Fluoride] Itching  . Clindamycin/Lincomycin Rash  . Codeine Nausea And Vomiting  . Furosemide Other (See Comments)    Nose bleed  . Iodinated Diagnostic Agents Itching and Rash    Pt states she has had ultrasound with dye but was fine  . Lactose   . Lactose Intolerance (Gi) Other (See Comments)    Increased calcium, sneezing, nasal issues  . Lanolin-Petrolatum Rash  . Metformin And Related Diarrhea  . Miralax [Polyethylene Glycol 3350]   . Miralax [Polyethylene Glycol] Rash  . Nasonex [employer]  Furoate] Other (See Comments)  . Quinine Rash  . Rifampin Other (See Comments)    DOES NOT REMEMBER THIS MEDICATION OR ALLERGY  . Sulfa Antibiotics Rash  . Sulfonamide Derivatives Rash  . Tramadol Rash    Anxiety  . Wellbutrin [Bupropion] Other (See Comments)    Muscle aches, pain, "made my skin crawl"  . Wool Alcohol [Lanolin] Rash  .  Zoledronic Acid Other (See Comments) and Rash    shaking    ROS Review of Systems    Objective:    Physical Exam  Constitutional: She is oriented to person, place, and time. She appears well-developed and well-nourished.  HENT:  Head: Normocephalic and atraumatic.  Cardiovascular: Normal rate, regular rhythm and normal heart sounds.  Pulmonary/Chest: Effort normal and breath sounds normal.  Neurological: She is alert and oriented to person, place, and time.  Skin: Skin is warm and dry.  Psychiatric: She has a normal mood and affect. Her behavior is normal.    BP (!) 122/53   Pulse 62   Ht _0  (1.575 m)   Wt 210 lb (95.3 kg)   SpO2 96%   BMI 38.41 kg/m  Wt Readings from Last 3 Encounters:  01/09/20 210 lb (95.3 kg)  11/06/19 215 lb (97.5 kg)  10/20/19 213 lb (96.6 kg)     Health Maintenance Due  Topic Date Due  . COVID-19 Vaccine (1) Never done  . OPHTHALMOLOGY EXAM  04/08/2019  . COLONOSCOPY  06/13/2019  . FOOT EXAM  08/26/2019    There are no preventive care reminders to display for this patient.  Lab Results  Component Value Date   TSH 1.27 01/09/2020   Lab Results  Component Value Date   WBC 6.5 03/14/2019   HGB 14.1 03/14/2019   HCT 42.7 03/14/2019   MCV 92.0 03/14/2019   PLT 287 03/14/2019   Lab Results  Component Value Date   NA 139 10/20/2019   K 4.2 10/20/2019   CHLORIDE 105 10/30/2014   CO2 25 10/20/2019   GLUCOSE 102 (H) 10/20/2019   BUN 17 10/20/2019   CREATININE 0.82 10/20/2019   BILITOT 0.3 10/20/2019   ALKPHOS 100 03/02/2018   AST 19 10/20/2019   ALT 15 10/20/2019   PROT 6.4 10/20/2019   ALBUMIN 4.0 03/02/2018   CALCIUM 10.6 (H) 10/20/2019   ANIONGAP 8 03/11/2018   EGFR 78 (L) 10/30/2014   Lab Results  Component Value Date   CHOL 164 04/11/2019   Lab Results  Component Value Date   HDL 68 04/11/2019   Lab Results  Component Value Date   LDLCALC 76 04/11/2019   Lab Results  Component Value Date   TRIG 121  04/11/2019   Lab Results  Component Value Date   CHOLHDL 2.4 04/11/2019   Lab Results  Component Value Date   HGBA1C 6.1 (A) 10/20/2019      Assessment & Plan:   Problem List Items Addressed This Visit      Cardiovascular and Mediastinum   Benign essential hypertension    Herschel [place] looks great today continue current regimen.        Respiratory   Asthma, moderate persistent    Reports that she is actually doing really well on her Advair she is also using her allergy eyedrops and oral medication with a heavy pollens and says so far she is doing well.        Endocrine   Hypothyroidism - Primary    Due to recheck thyroid level.  Is been almost a year.  He denies any recent symptoms change.      Relevant Orders   TSH (Completed)    Other Visit Diagnoses    Left foot pain         Left foot pain - she would like to get diabetic shoes for support. I think she will be a good candidate with her persistent pain and scar tissue.    " Hole in sinuses"-unclear about exactly what is going on with this area.  I do not have records from the dentist specifically about this.  Evidently she was followed by ENT for quite some time and they basically just had her doing some saline rinses.  But we discussed that there may not be any additional treatment recommended but certainly if she would like to have a second opinion I am more than happy to refer her or if she would like to go back to [place] [facility] I am happy to refer her there.  Patient never clarified what she wanted to do about this before she left today.  I was able to find a ENT note from January 2019 where she basically had some nasal crusting.  The ENT doctor suspected that she had likely had a dental abscess from a prior procedure and that the exam was reassuring and that it looks like the abscess had resolved.  He recommended that she continue daily nasal saline spray with a nasal crusting he also gave her some Lotrisone  cream to use on her outer ear where she was getting a lot of crusting and itching felt to be eczema related.     No orders of the defined types were placed in this encounter.   Follow-up: Return in about 2 months (around 03/10/2020) for Diabetes follow-up.    Beatrice Lecher, MD

## 2020-01-09 NOTE — Assessment & Plan Note (Signed)
Reports that she is actually doing really well on her Advair she is also using her allergy eyedrops and oral medication with a heavy pollens and says so far she is doing well.

## 2020-01-09 NOTE — Assessment & Plan Note (Signed)
Tanya Hogan looks great today continue current regimen.

## 2020-01-10 ENCOUNTER — Telehealth: Payer: Self-pay | Admitting: Family Medicine

## 2020-01-10 LAB — TSH: TSH: 1.27 mIU/L (ref 0.40–4.50)

## 2020-01-10 NOTE — Progress Notes (Signed)
All labs are normal. 

## 2020-01-10 NOTE — Telephone Encounter (Signed)
I will be happy to sign for them.  Just need to forms send to our office to complete

## 2020-01-10 NOTE — Telephone Encounter (Signed)
Patient calling in wanting to make sure that PCP can sign the authorization for her diabetic shoes. States that insurance is waiting on that to get it approved.

## 2020-01-11 ENCOUNTER — Encounter: Payer: Self-pay | Admitting: Family Medicine

## 2020-01-11 NOTE — Telephone Encounter (Signed)
Patient did not answer the phone. Left voicemail for her to call us back with any questions but let her know to fax or bring in the form.

## 2020-01-12 ENCOUNTER — Encounter: Payer: Self-pay | Admitting: Family Medicine

## 2020-01-16 ENCOUNTER — Encounter: Payer: Self-pay | Admitting: Family Medicine

## 2020-01-16 ENCOUNTER — Ambulatory Visit (INDEPENDENT_AMBULATORY_CARE_PROVIDER_SITE_OTHER): Payer: Medicare Other | Admitting: Family Medicine

## 2020-01-16 ENCOUNTER — Other Ambulatory Visit: Payer: Self-pay

## 2020-01-16 VITALS — BP 137/72 | HR 70 | Ht 62.0 in | Wt 209.0 lb

## 2020-01-16 DIAGNOSIS — Z23 Encounter for immunization: Secondary | ICD-10-CM

## 2020-01-16 DIAGNOSIS — Z0000 Encounter for general adult medical examination without abnormal findings: Secondary | ICD-10-CM

## 2020-01-16 NOTE — Patient Instructions (Signed)
Health Maintenance After Age 71 After age 71, you are at a higher risk for certain long-term diseases and infections as well as injuries from falls. Falls are a major cause of broken bones and head injuries in people who are older than age 71. Getting regular preventive care can help to keep you healthy and well. Preventive care includes getting regular testing and making lifestyle changes as recommended by your health care provider. Talk with your health care provider about:  Which screenings and tests you should have. A screening is a test that checks for a disease when you have no symptoms.  A diet and exercise plan that is right for you. What should I know about screenings and tests to prevent falls? Screening and testing are the best ways to find a health problem early. Early diagnosis and treatment give you the best chance of managing medical conditions that are common after age 71. Certain conditions and lifestyle choices may make you more likely to have a fall. Your health care provider may recommend:  Regular vision checks. Poor vision and conditions such as cataracts can make you more likely to have a fall. If you wear glasses, make sure to get your prescription updated if your vision changes.  Medicine review. Work with your health care provider to regularly review all of the medicines you are taking, including over-the-counter medicines. Ask your health care provider about any side effects that may make you more likely to have a fall. Tell your health care provider if any medicines that you take make you feel dizzy or sleepy.  Osteoporosis screening. Osteoporosis is a condition that causes the bones to get weaker. This can make the bones weak and cause them to break more easily.  Blood pressure screening. Blood pressure changes and medicines to control blood pressure can make you feel dizzy.  Strength and balance checks. Your health care provider may recommend certain tests to check your  strength and balance while standing, walking, or changing positions.  Foot health exam. Foot pain and numbness, as well as not wearing proper footwear, can make you more likely to have a fall.  Depression screening. You may be more likely to have a fall if you have a fear of falling, feel emotionally low, or feel unable to do activities that you used to do.  Alcohol use screening. Using too much alcohol can affect your balance and may make you more likely to have a fall. What actions can I take to lower my risk of falls? General instructions  Talk with your health care provider about your risks for falling. Tell your health care provider if: ? You fall. Be sure to tell your health care provider about all falls, even ones that seem minor. ? You feel dizzy, sleepy, or off-balance.  Take over-the-counter and prescription medicines only as told by your health care provider. These include any supplements.  Eat a healthy diet and maintain a healthy weight. A healthy diet includes low-fat dairy products, low-fat (lean) meats, and fiber from whole grains, beans, and lots of fruits and vegetables. Home safety  Remove any tripping hazards, such as rugs, cords, and clutter.  Install safety equipment such as grab bars in bathrooms and safety rails on stairs.  Keep rooms and walkways well-lit. Activity   Follow a regular exercise program to stay fit. This will help you maintain your balance. Ask your health care provider what types of exercise are appropriate for you.  If you need a cane or   Deyo, use it as recommended by your health care provider.  Wear supportive shoes that have nonskid soles. Lifestyle  Do not drink alcohol if your health care provider tells you not to drink.  If you drink alcohol, limit how much you have: ? 0-1 drink a day for women. ? 0-2 drinks a day for men.  Be aware of how much alcohol is in your drink. In the [place] one drink equals one typical bottle of beer (12  oz), one-half glass of wine (5 oz), or one shot of hard liquor (1 oz).  Do not use any products that contain nicotine or tobacco, such as cigarettes and e-cigarettes. If you need help quitting, ask your health care provider. Summary  Having a healthy lifestyle and getting preventive care can help to protect your health and wellness after age 71.  Screening and testing are the best way to find a health problem early and help you avoid having a fall. Early diagnosis and treatment give you the best chance for managing medical conditions that are more common for people who are older than age 71.  Falls are a major cause of broken bones and head injuries in people who are older than age 71. Take precautions to prevent a fall at home.  Work with your health care provider to learn what changes you can make to improve your health and wellness and to prevent falls. This information is not intended to replace advice given to you by your health care provider. Make sure you discuss any questions you have with your health care provider. Document Revised: 12/01/2018 Document Reviewed: 06/23/2017 Elsevier Patient Education  2020 [employer]  

## 2020-01-16 NOTE — Progress Notes (Addendum)
Subjective:   Tanya Hogan is a 69 y.o. female who presents for Medicare Annual (Subsequent) preventive examination.  Review of Systems:  Comprehensive ROS is negative.         Objective:     Vitals: BP 137/72   Pulse 70   Ht 5' 2"  (1.575 m)   Wt 209 lb (94.8 kg)   SpO2 96%   BMI 38.23 kg/m   Body mass index is 38.23 kg/m.  Advanced Directives 10/10/2018 06/13/2018 03/10/2018 03/10/2018 10/05/2017 10/30/2014 08/23/2014  Does Patient Have a Medical Advance Directive? No No No No No No No  Would patient like information on creating a medical advance directive? No - Patient declined No - Patient declined No - Patient declined No - Patient declined No - Patient declined No - patient declined information No - patient declined information    Tobacco Social History   Tobacco Use  Smoking Status Former Smoker  . Packs/day: 0.00  . Years: 0.00  . Pack years: 0.00  . Quit date: 08/24/1966  . Years since quitting: 53.4  Smokeless Tobacco Never Used     Counseling given: Not Answered   Clinical Intake:  Pre-visit preparation completed: Yes        BMI - recorded: 38 Nutritional Status: BMI > 30  Obese Diabetes: No     Interpreter Needed?: No     Past Medical History:  Diagnosis Date  . Alkaline phosphatase elevation   . Asthma   . Breast cancer ([place]) 2011  . Carpal tunnel syndrome   . Carpal tunnel syndrome    bilateral  . CHF (congestive heart failure) ([place])   . Cirrhosis, non-alcoholic ([place])   . Closed fracture of unspecified part of radius (alone)   . DDD (degenerative disc disease), lumbar   . Depression    pt denies  . Diabetes mellitus without complication ([place])   . Enlarged heart   . Fibromyalgia   . GERD (gastroesophageal reflux disease)   . History of kidney stones   . HTN (hypertension)   . Hypothyroidism   . IBS (irritable bowel syndrome)   . LBBB (left bundle branch block)   . Microcytic anemia 05/14/2017  . Pneumonia   . Presence of  permanent cardiac pacemaker   . PVD (peripheral vascular disease) ([place])    2019  . Sleep apnea    uses cpap   Past Surgical History:  Procedure Laterality Date  . BREAST LUMPECTOMY Left 2011  . COLONOSCOPY    . FOOT SURGERY    . KNEE SURGERY    . MASTECTOMY Right    05/2019  . PACEMAKER GENERATOR CHANGE N/A 08/23/2014   Procedure: PACEMAKER GENERATOR CHANGE;  Surgeon: Evans Lance, MD;  Location: [facility] CATH [facility];  Service: Cardiovascular;  Laterality: N/A;  . PACEMAKER PLACEMENT  2009   [facility]  . RECTAL SURGERY  1976  . REDUCTION MAMMAPLASTY Left    05/2019  . TOTAL SHOULDER ARTHROPLASTY Right 03/10/2018  . TOTAL SHOULDER ARTHROPLASTY Right 03/10/2018   Procedure: RIGHT TOTAL SHOULDER ARTHROPLASTY;  Surgeon: [name], MD;  Location: [facility];  Service: Orthopedics;  Laterality: Right;  . TUBAL LIGATION     Family History  Problem Relation Age of Onset  . Melanoma Mother   . Parkinson's disease Mother   . Heart failure Other   . Breast cancer Other   . Hypertension Other    Social History   Socioeconomic History  . Marital status: Divorced  Spouse name: Not on file  . Number of children: 2  . Years of education: 22  . Highest education level: Associate degree: academic program  Occupational History  . 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 Smoking status: Former Smoker    Packs/day: 0.00    Years: 0.00    Pack years: 0.00    Quit date: 08/24/1966    Years since quitting: 53.4  . Smokeless tobacco: Never Used  Substance and Sexual Activity  . Alcohol use: No  . Drug use: No  . Sexual activity: Not Currently  Other Topics Concern  . Not on file  Social History Narrative   Retired. 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 Likes Systems developer and music. Loves animals.   Social Determinants of Health   Financial Resource Strain:   . Difficulty of Paying Living Expenses:   Food Insecurity:   .  Worried About Charity fundraiser in the Last Year:   . Arboriculturist in the Last Year:   Transportation Needs: No Transportation Needs  . Lack of Transportation (Medical): No  . Lack of Transportation (Non-Medical): No  Physical Activity:   . Days of Exercise per Week:   . Minutes of Exercise per Session:   Stress:   . Feeling of Stress :   Social Connections:   . Frequency of Communication with Friends and Family:   . Frequency of Social Gatherings with Friends and Family:   . Attends Religious Services:   . Active Member of Clubs or Organizations:   . Attends Archivist Meetings:   [name] Marital Status:     Outpatient Encounter Medications as of 01/16/2020  Medication Sig  . ADVAIR DISKUS 250-50 MCG/DOSE AEPB USE 1 INHALATION BY MOUTH  TWICE DAILY  . alendronate (FOSAMAX) 70 MG tablet Take 1 tablet (70 mg total) by mouth every 7 (seven) days. Take with a full glass of water on an empty stomach.  . AMBULATORY NON FORMULARY MEDICATION Medication Name: Tubing and supplies for nebulizer. Fax - [phone]  . blood glucose meter kit and supplies KIT Dispense based on patient and insurance preference.  . bumetanide (BUMEX) 1 MG tablet TAKE 1 TABLET BY MOUTH  TWICE DAILY  . BYSTOLIC 10 MG tablet TAKE 1 TABLET BY MOUTH  DAILY  . cetirizine (ZYRTEC) 10 MG tablet Take 10 mg by mouth daily.  [name] dicyclomine (BENTYL) 20 MG tablet Take by mouth.  . diltiazem (CARDIZEM) 120 MG tablet TAKE 1 TABLET BY MOUTH  DAILY  . EPIPEN 2-PAK 0.3 MG/0.3ML SOAJ injection INJECT 1 PEN INTO MUSCLE OF LATERAL THIGH FOR SEVERE ALLERGIC REACTION. CALL 911 AFTER USING THIS MEDICATION.  [name] glucose blood (ACCU-CHEK GUIDE) test strip Dx DM E11.8 - Check fasting blood sugar every morning and once 2 hours after largest meal of the day.  . insulin lispro (HUMALOG KWIKPEN) 100 UNIT/ML KwikPen Inject 0.03-0.12 mLs (3-12 Units total) into the skin 3 (three) times daily as needed (hyperglycemia>150). Sliding scale.  Administer subQ within 15 minutes before or immediately after a meal  . Insulin Pen Needle 31G X 8 MM MISC To be used for injecting insulin Dx: E11.8  . Lancets Misc. (ACCU-CHEK MULTICLIX LANCET DEV) KIT Test twice a day. Dx:E11.8  . LANTUS SOLOSTAR 100 UNIT/ML Solostar Pen INJECT SUBCUTANEOUSLY 40  UNITS AT BEDTIME  . levothyroxine (SYNTHROID) 75 MCG tablet TAKE 1 TABLET BY MOUTH  DAILY  . LINZESS 145  MCG CAPS capsule Take 145 mcg by mouth daily.  . Multiple Vitamins-Minerals (OCUVITE EYE + MULTI) TABS Take 1 tablet by mouth daily.  . Olopatadine HCl 0.2 % SOLN Place 1 drop into both eyes daily.  . pantoprazole (PROTONIX) 40 MG tablet Take 40 mg by mouth daily.  . pravastatin (PRAVACHOL) 40 MG tablet TAKE 1 TABLET BY MOUTH AT  BEDTIME  . PROAIR HFA 108 (90 Base) MCG/ACT inhaler USE 1 TO 2 INHALATIONS BY  MOUTH INTO THE LUNGS EVERY  6 HOURS AS NEEDED FOR  WHEEZING OR SHORTNESS OF  BREATH  . RESTASIS 0.05 % ophthalmic emulsion   . tiotropium (SPIRIVA HANDIHALER) 18 MCG inhalation capsule Spiriva with HandiHaler 18 mcg and inhalation capsules  . valACYclovir (VALTREX) 1000 MG tablet TAKE 1 TABLET BY MOUTH TWICE DAILY   No facility-administered encounter medications on file as of 01/16/2020.    Activities of Daily Living In your present state of health, do you have any difficulty performing the following activities: 01/16/2020  Hearing? N  Vision? N  Difficulty concentrating or making decisions? N  Walking or climbing stairs? N  Dressing or bathing? N  Doing errands, shopping? N  Some recent data might be hidden    Patient Care Team: Hali Marry, MD as PCP - General (Family Medicine) Marcy Panning, MD (Hematology and Oncology) Lelon Perla, MD as Attending Physician (Cardiology) Evans Lance, MD (Cardiology) Dr. Caprice Kluver (Otolaryngology) Amada Kingfisher, MD as Consulting Physician (Hematology and Oncology) Gerre Pebbles, MD as Referring Physician  (Gastroenterology)    Assessment:   This is a routine wellness examination for Mittie.  Physical Exam Constitutional:      Appearance: She is well-developed.  HENT:     Head: Normocephalic and atraumatic.  Cardiovascular:     Rate and Rhythm: Normal rate and regular rhythm.     Heart sounds: Normal heart sounds.  Pulmonary:     Effort: Pulmonary effort is normal.     Breath sounds: Normal breath sounds.  Skin:    General: Skin is warm and dry.  Neurological:     Mental Status: She is alert and oriented to person, place, and time.  Psychiatric:        Behavior: Behavior normal.      Exercise Activities and Dietary recommendations    Goals    . Exercise 150 min/wk Moderate Activity     Wants to get back into going to the pool and do water exercising.    . Exercise 150 min/wk Moderate Activity       Fall Risk Fall Risk  11/06/2019 10/10/2018 06/13/2018 08/27/2017 02/18/2017  Falls in the past year? 0 0 No No No  Number falls in past yr: 0 - - - -  Injury with Fall? 0 - - - -  Risk Factor Category  - - - - -  Risk for fall due to : No Fall Risks Impaired balance/gait - - -  Risk for fall due to: Comment - states some days is shaky when walks. - - -     Depression Screen PHQ 2/9 Scores 01/16/2020 10/10/2018 06/13/2018 05/24/2018  PHQ - 2 Score 0 0 0 0  PHQ- 9 Score 0 - - -     Cognitive Function     6CIT Screen 01/16/2020 10/10/2018 10/05/2017  What Year? 0 points 0 points 0 points  What month? 0 points 0 points 0 points  What time? 0 points 0 points 0 points  Count  back from 20 0 points 0 points 2 points  Months in reverse 2 points 2 points 2 points  Repeat phrase 2 points 0 points 6 points  Total Score 4 2 10     Immunization History  Administered Date(s) Administered  . Fluad Quad(high Dose 65+) 04/11/2019  . Influenza Split 05/10/2012, 05/04/2014  . Influenza Whole 06/14/2009, 05/08/2010, 04/24/2011  . Influenza, High Dose Seasonal PF 05/17/2017, 05/24/2018   . Influenza,inj,Quad PF,6+ Mos 04/28/2013, 05/04/2014, 05/06/2015, 05/18/2016  . Moderna SARS-COVID-2 Vaccination 11/01/2019, 11/29/2019  . Pneumococcal Conjugate-13 05/25/2014  . Pneumococcal Polysaccharide-23 11/28/2012, 12/28/2018  . Td 08/24/2004, 03/07/2010  . Tdap 07/14/2012  . Zoster 04/24/2011  . Zoster Recombinat (Shingrix) 10/06/2017, 01/16/2020    Qualifies for Shingles Vaccine?yes, 2nd given today.   Screening Tests Health Maintenance  Topic Date Due  . OPHTHALMOLOGY EXAM  04/08/2019  . COLONOSCOPY  06/13/2019  . INFLUENZA VACCINE  03/24/2020  . HEMOGLOBIN A1C  04/18/2020  . URINE MICROALBUMIN  10/05/2020  . FOOT EXAM  01/15/2021  . MAMMOGRAM  09/12/2021  . TETANUS/TDAP  07/14/2022  . DEXA SCAN  Completed  . COVID-19 Vaccine  Completed  . Hepatitis C Screening  Completed  . PNA vac Low Risk Adult  Completed    Cancer Screenings: Lung: Low Dose CT Chest recommended if Age 71-80 years, 30 pack-year currently smoking OR have quit w/in 15years. Patient does not qualify. Breast:  Up to date on Mammogram? Yes   Up to date of Bone Density/Dexa? Yes Colorectal: UTD  Additional Screenings:  Hepatitis C Screening: Done     Plan:    Medicare Wellness Exam    I have personally reviewed and noted the following in the patient's chart:   . Medical and social history . Use of alcohol, tobacco or illicit drugs  . Current medications and supplements . Functional ability and status . Nutritional status . Physical activity . Advanced directives . List of other physicians . Hospitalizations, surgeries, and ER visits in previous 12 months . Vitals . Screenings to include cognitive, depression, and falls -low fall risk. [name] Referrals and appointments -we will be sending over paperwork from [employer] for diabetic shoes.  In addition, I have reviewed and discussed with patient certain preventive protocols, quality metrics, and best practice recommendations. A written  personalized care plan for preventive services as well as general preventive health recommendations were provided to patient.     Beatrice Lecher, MD  01/16/2020

## 2020-01-23 ENCOUNTER — Telehealth (INDEPENDENT_AMBULATORY_CARE_PROVIDER_SITE_OTHER): Payer: Medicare Other | Admitting: Family Medicine

## 2020-01-23 DIAGNOSIS — J019 Acute sinusitis, unspecified: Secondary | ICD-10-CM

## 2020-01-23 DIAGNOSIS — G4737 Central sleep apnea in conditions classified elsewhere: Secondary | ICD-10-CM | POA: Diagnosis not present

## 2020-01-23 DIAGNOSIS — G4733 Obstructive sleep apnea (adult) (pediatric): Secondary | ICD-10-CM | POA: Diagnosis not present

## 2020-01-23 MED ORDER — DOXYCYCLINE HYCLATE 100 MG PO TABS: 100.0000 mg | ORAL_TABLET | Freq: Two times a day (BID) | ORAL | 0 refills | Status: DC

## 2020-01-23 NOTE — Progress Notes (Signed)
Virtual Visit via Telephone Note  I connected with Tanya Hogan on 01/24/20 at  2:40 PM EDT by telephone and verified that I am speaking with the correct person using two identifiers.   I discussed the limitations, risks, security and privacy concerns of performing an evaluation and management service by telephone and the availability of in person appointments. I also discussed with the patient that there may be a patient responsible charge related to this service. The patient expressed understanding and agreed to proceed.  Patient location: Provider loccation: In office   Subjective:    CC: Sinus pain   HPI: Sxs x 1 year or more.  Ears have been popping and cracking and feel plugged.  Took Zyrtec for possible allergies.  + Coughing up phlegm.  Some post nasal drip that is constant.  No fever or chills.  No GI sxs.  No cold medications.  Mild SOB.     Past medical history, Surgical history, Family history not pertinant except as noted below, Social history, Allergies, and medications have been entered into the medical record, reviewed, and corrections made.   Review of Systems: No fevers, chills, night sweats, weight loss, chest pain, or shortness of breath.   Objective:    General: Speaking clearly in complete sentences without any shortness of breath.  Alert and oriented x3.  Normal judgment. No apparent acute distress.    Impression and Recommendations:     Acute sinusitis-discussed treatment options.  We will start on doxycycline and she does have significant allergy list.  If she is not feeling better by the end of the week please let me know.  Recommend a trial of nasal saline okay to use over-the-counter decongestant if needed.     I discussed the assessment and treatment plan with the patient. The patient was provided an opportunity to ask questions and all were answered. The patient agreed with the plan and demonstrated an understanding of the instructions.   The  patient was advised to call back or seek an in-person evaluation if the symptoms worsen or if the condition fails to improve as anticipated.  I provided 18 minutes of non-face-to-face time during this encounter.   Beatrice Lecher, MD

## 2020-01-24 ENCOUNTER — Encounter: Payer: Self-pay | Admitting: Family Medicine

## 2020-01-29 ENCOUNTER — Telehealth: Payer: Self-pay | Admitting: Family Medicine

## 2020-01-29 NOTE — Telephone Encounter (Signed)
Tanya Hogan came into the office in search of some paperwork she dropped off. It was form for Dr. Madilyn Fireman to fill out and have faxed to [employer]. Paperwork might be in the process of the scan center. I cannot find any record in the patients chart.   Would you happen to have any documentation around your desk?  This concerns her orthotics.

## 2020-01-31 ENCOUNTER — Telehealth: Payer: Self-pay | Admitting: Internal Medicine

## 2020-01-31 ENCOUNTER — Telehealth: Payer: Self-pay

## 2020-01-31 DIAGNOSIS — J0140 Acute pansinusitis, unspecified: Secondary | ICD-10-CM

## 2020-01-31 DIAGNOSIS — H9203 Otalgia, bilateral: Secondary | ICD-10-CM

## 2020-01-31 NOTE — Telephone Encounter (Signed)
New Message:     Pt said she was just evaluated by her insurance company. They told her to call and discuss her medicine with you.

## 2020-01-31 NOTE — Telephone Encounter (Signed)
Dr. Madilyn Fireman do you know anything about this? I did place some forms in your box recently however, I don't believe these were the same ones.

## 2020-01-31 NOTE — Telephone Encounter (Signed)
Patient called to let Dr Madilyn Fireman know she is finishing up her antibiotic but is still having issues with her ears. Reports a constant fullness like moisture in her ears with constant popping and cracking, and the sensation of bubbles. States this is affecting her hearing.   Patient also wants to states she received a call from insurance that her pantoprazole could be causing damage to her liver. Patient notes that she trusts [place] more than insurance and did not really worry about this call. I advised patient is doesn't look like we prescribe this for her, she states it could possibly be her "gut doctor" but she just wanted to make [place] aware.   ENT referral pended if that is your recommended next step?

## 2020-01-31 NOTE — Telephone Encounter (Signed)
We can try either a nasal steroid like flonase or an oral steroid. If that doesn't help then will consider ENT referral.

## 2020-02-01 MED ORDER — PREDNISONE 20 MG PO TABS: 40.0000 mg | ORAL_TABLET | Freq: Every day | ORAL | 0 refills | Status: DC

## 2020-02-01 NOTE — Telephone Encounter (Signed)
Patient advised. States she cannot do nasal steroids because Flonase is what "started the hole in my nose"  She ok with oral steroid if Dr Madilyn Fireman feels that is appropriate

## 2020-02-01 NOTE — Telephone Encounter (Signed)
rx for prednisone sent to [facility]

## 2020-02-02 NOTE — Telephone Encounter (Signed)
Returned call to Pt.  Per Pt her insurance company has been calling her and wants her to call her cardiologist to see if there are other medications she should be on for her heart.  Pt states she feels great and really doesn't want to make any medication changes but her insurance company won't stop calling.  We discussed her medications.  Also discussed that she has not been tolerant of many medications (see Pt allergies).  Advised if she felt ok, no sob or fatigue, would not recommend making any medication changes at this time.  Last Echo was 2011-will discuss with [facility] ordering a routine Echo at her next follow up appt.  Pt in agreement.

## 2020-02-04 DIAGNOSIS — M069 Rheumatoid arthritis, unspecified: Secondary | ICD-10-CM | POA: Diagnosis not present

## 2020-02-04 DIAGNOSIS — I10 Essential (primary) hypertension: Secondary | ICD-10-CM | POA: Diagnosis not present

## 2020-02-04 DIAGNOSIS — K219 Gastro-esophageal reflux disease without esophagitis: Secondary | ICD-10-CM | POA: Diagnosis not present

## 2020-02-04 DIAGNOSIS — Z7983 Long term (current) use of bisphosphonates: Secondary | ICD-10-CM | POA: Diagnosis not present

## 2020-02-04 DIAGNOSIS — G4733 Obstructive sleep apnea (adult) (pediatric): Secondary | ICD-10-CM | POA: Diagnosis not present

## 2020-02-04 DIAGNOSIS — I428 Other cardiomyopathies: Secondary | ICD-10-CM | POA: Diagnosis not present

## 2020-02-04 DIAGNOSIS — I501 Left ventricular failure: Secondary | ICD-10-CM | POA: Diagnosis not present

## 2020-02-04 DIAGNOSIS — I517 Cardiomegaly: Secondary | ICD-10-CM | POA: Diagnosis not present

## 2020-02-04 DIAGNOSIS — R55 Syncope and collapse: Secondary | ICD-10-CM | POA: Diagnosis not present

## 2020-02-04 DIAGNOSIS — G894 Chronic pain syndrome: Secondary | ICD-10-CM | POA: Diagnosis not present

## 2020-02-04 DIAGNOSIS — I420 Dilated cardiomyopathy: Secondary | ICD-10-CM | POA: Diagnosis not present

## 2020-02-04 DIAGNOSIS — R42 Dizziness and giddiness: Secondary | ICD-10-CM | POA: Diagnosis not present

## 2020-02-04 DIAGNOSIS — I5020 Unspecified systolic (congestive) heart failure: Secondary | ICD-10-CM | POA: Diagnosis not present

## 2020-02-04 DIAGNOSIS — I472 Ventricular tachycardia: Secondary | ICD-10-CM | POA: Diagnosis not present

## 2020-02-04 DIAGNOSIS — E119 Type 2 diabetes mellitus without complications: Secondary | ICD-10-CM | POA: Diagnosis not present

## 2020-02-04 DIAGNOSIS — I110 Hypertensive heart disease with heart failure: Secondary | ICD-10-CM | POA: Diagnosis not present

## 2020-02-04 DIAGNOSIS — I442 Atrioventricular block, complete: Secondary | ICD-10-CM | POA: Diagnosis not present

## 2020-02-04 DIAGNOSIS — Z950 Presence of cardiac pacemaker: Secondary | ICD-10-CM | POA: Diagnosis not present

## 2020-02-04 DIAGNOSIS — D72829 Elevated white blood cell count, unspecified: Secondary | ICD-10-CM | POA: Diagnosis not present

## 2020-02-04 DIAGNOSIS — I5022 Chronic systolic (congestive) heart failure: Secondary | ICD-10-CM | POA: Diagnosis not present

## 2020-02-04 DIAGNOSIS — E039 Hypothyroidism, unspecified: Secondary | ICD-10-CM | POA: Diagnosis not present

## 2020-02-04 DIAGNOSIS — Z87891 Personal history of nicotine dependence: Secondary | ICD-10-CM | POA: Diagnosis not present

## 2020-02-04 DIAGNOSIS — I248 Other forms of acute ischemic heart disease: Secondary | ICD-10-CM | POA: Diagnosis not present

## 2020-02-04 DIAGNOSIS — Z79899 Other long term (current) drug therapy: Secondary | ICD-10-CM | POA: Diagnosis not present

## 2020-02-04 DIAGNOSIS — Z853 Personal history of malignant neoplasm of breast: Secondary | ICD-10-CM | POA: Diagnosis not present

## 2020-02-04 DIAGNOSIS — Z7951 Long term (current) use of inhaled steroids: Secondary | ICD-10-CM | POA: Diagnosis not present

## 2020-02-04 DIAGNOSIS — J4540 Moderate persistent asthma, uncomplicated: Secondary | ICD-10-CM | POA: Diagnosis not present

## 2020-02-04 DIAGNOSIS — Z8249 Family history of ischemic heart disease and other diseases of the circulatory system: Secondary | ICD-10-CM | POA: Diagnosis not present

## 2020-02-04 DIAGNOSIS — K589 Irritable bowel syndrome without diarrhea: Secondary | ICD-10-CM | POA: Diagnosis not present

## 2020-02-04 DIAGNOSIS — Z96611 Presence of right artificial shoulder joint: Secondary | ICD-10-CM | POA: Diagnosis not present

## 2020-02-06 NOTE — Telephone Encounter (Signed)
We did receive the paperwork from [employer].  She has to have a diabetic foot exam that is extensive within the last 2 months.  So see if we can get her on the schedule just for the documentation of the foot exam.maybe Friday AM

## 2020-02-07 NOTE — Telephone Encounter (Signed)
Attempted to contact pt.  However, line was busy.  Charyl Bigger, CMA

## 2020-02-09 DIAGNOSIS — E039 Hypothyroidism, unspecified: Secondary | ICD-10-CM | POA: Diagnosis not present

## 2020-02-09 DIAGNOSIS — I5022 Chronic systolic (congestive) heart failure: Secondary | ICD-10-CM | POA: Diagnosis not present

## 2020-02-09 DIAGNOSIS — I951 Orthostatic hypotension: Secondary | ICD-10-CM | POA: Diagnosis not present

## 2020-02-09 DIAGNOSIS — I472 Ventricular tachycardia: Secondary | ICD-10-CM | POA: Diagnosis not present

## 2020-02-09 DIAGNOSIS — Z950 Presence of cardiac pacemaker: Secondary | ICD-10-CM | POA: Diagnosis not present

## 2020-02-09 DIAGNOSIS — I428 Other cardiomyopathies: Secondary | ICD-10-CM | POA: Diagnosis not present

## 2020-02-09 NOTE — Telephone Encounter (Signed)
Pt has an appt nxt week

## 2020-02-13 ENCOUNTER — Encounter: Payer: Self-pay | Admitting: Family Medicine

## 2020-02-13 ENCOUNTER — Ambulatory Visit (INDEPENDENT_AMBULATORY_CARE_PROVIDER_SITE_OTHER): Payer: Medicare Other | Admitting: Family Medicine

## 2020-02-13 VITALS — BP 122/59 | HR 69 | Ht 62.0 in | Wt 212.0 lb

## 2020-02-13 DIAGNOSIS — E038 Other specified hypothyroidism: Secondary | ICD-10-CM | POA: Diagnosis not present

## 2020-02-13 DIAGNOSIS — R609 Edema, unspecified: Secondary | ICD-10-CM

## 2020-02-13 DIAGNOSIS — I5022 Chronic systolic (congestive) heart failure: Secondary | ICD-10-CM | POA: Diagnosis not present

## 2020-02-13 DIAGNOSIS — T148XXA Other injury of unspecified body region, initial encounter: Secondary | ICD-10-CM | POA: Diagnosis not present

## 2020-02-13 DIAGNOSIS — I472 Ventricular tachycardia, unspecified: Secondary | ICD-10-CM

## 2020-02-13 DIAGNOSIS — R600 Localized edema: Secondary | ICD-10-CM

## 2020-02-13 DIAGNOSIS — I4729 Other ventricular tachycardia: Secondary | ICD-10-CM

## 2020-02-13 NOTE — Progress Notes (Addendum)
Established Patient Office Visit  Subjective:  Patient ID: Tanya Hogan, female    DOB: 1951/05/20  Age: 69 y.o. MRN: [id number]  CC:  Chief Complaint  Patient presents with  . Hospitalization Follow-up    HPI [name] presents for hospital follow-up.  She was actually admitted to the hospital at [facility] through [facility] on June 13 and discharged home on June 16..  She had presented with a presyncopal dizzy spell and found to be in nonsustained V. tach with 1 sustained V. tach lasting a most 20 minutes.  She has a known history of complete heart block status post MDT biventricular pacemaker.  Device interrogation revealed the multiple VT episodes lasting from seconds to minutes.  She describes personally feeling like her heart was quivering or vibrating.  TTE on 614 showed a decreased EF of 35 to 40% with mild to moderate global hypokinesis of the left ventricle.  She also had some mild mitral regurg and tricuspid regurg.  Her Bystolic was discontinued and she was started on carvedilol.  Cardizem was also discontinued.  She was started on amiodarone.  She also underwent a cardiac cath on June 16 which was negative no significant findings.  She feels like the amiodarone causes her to not feel well.  She says she just feels extremely fatigued she is noted she is swelling a little bit more than usual and she also feels short of breath.  She really like to get off of it at all possible but understands the importance of being on the medication.  She really wants to get back in with her regular cardiologist Dr. Lovena Le and get his opinion on everything.  She was restarted on diltiazem.  She was also started on Bumex.  She also complains of significant bruising of her arms and hands from the IV and blood draws.  She has some ankle edema that is new onset.  Diabetes-she is currently just using 20 units of Lantus.    She is also here for diabetic foot exam.  Past  Medical History:  Diagnosis Date  . Alkaline phosphatase elevation   . Asthma   . Breast cancer ([place]) 2011  . Carpal tunnel syndrome   . Carpal tunnel syndrome    bilateral  . CHF (congestive heart failure) ([place])   . Cirrhosis, non-alcoholic ([place])   . Closed fracture of unspecified part of radius (alone)   . DDD (degenerative disc disease), lumbar   . Depression    pt denies  . Diabetes mellitus without complication ([place])   . Enlarged heart   . Fibromyalgia   . GERD (gastroesophageal reflux disease)   . History of kidney stones   . HTN (hypertension)   . Hypothyroidism   . IBS (irritable bowel syndrome)   . LBBB (left bundle branch block)   . Microcytic anemia 05/14/2017  . Pneumonia   . Presence of permanent cardiac pacemaker   . PVD (peripheral vascular disease) ([place])    2019  . Sleep apnea    uses cpap    Past Surgical History:  Procedure Laterality Date  . BREAST LUMPECTOMY Left 2011  . COLONOSCOPY    . FOOT SURGERY    . KNEE SURGERY    . MASTECTOMY Right    05/2019  . PACEMAKER GENERATOR CHANGE N/A 08/23/2014   Procedure: PACEMAKER GENERATOR CHANGE;  Surgeon: Evans Lance, MD;  Location: [facility] CATH LAB;  Service: Cardiovascular;  Laterality: N/A;  .  PACEMAKER PLACEMENT  2009   [facility] cardiology  . RECTAL SURGERY  1976  . REDUCTION MAMMAPLASTY Left    05/2019  . TOTAL SHOULDER ARTHROPLASTY Right 03/10/2018  . TOTAL SHOULDER ARTHROPLASTY Right 03/10/2018   Procedure: RIGHT TOTAL SHOULDER ARTHROPLASTY;  Surgeon: [name], MD;  Location: [place];  Service: Orthopedics;  Laterality: Right;  . TUBAL LIGATION      Family History  Problem Relation Age of Onset  . Melanoma Mother   . Parkinson's disease Mother   . Heart failure Other   . Breast cancer Other   . Hypertension Other     Social History   Socioeconomic History  . Marital status: Divorced    Spouse name: Not on file  . Number of children: 2  . Years of education: 22  . Highest education  level: Associate degree: academic program  Occupational History  . 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 Smoking status: Former Smoker    Packs/day: 0.00    Years: 0.00    Pack years: 0.00    Quit date: 08/24/1966    Years since quitting: 53.5  . Smokeless tobacco: Never Used  Vaping Use  . Vaping Use: Never used  Substance and Sexual Activity  . Alcohol use: No  . Drug use: No  . Sexual activity: Not Currently  Other Topics Concern  . Not on file  Social History Narrative   Retired. 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 Likes Systems developer and music. Loves animals.   Social Determinants of Health   Financial Resource Strain:   . Difficulty of Paying Living Expenses:   Food Insecurity:   . Worried About Charity fundraiser in the Last Year:   . Arboriculturist in the Last Year:   Transportation Needs: No Transportation Needs  . Lack of Transportation (Medical): No  . Lack of Transportation (Non-Medical): No  Physical Activity:   . Days of Exercise per Week:   . Minutes of Exercise per Session:   Stress:   . Feeling of Stress :   Social Connections:   . Frequency of Communication with Friends and Family:   . Frequency of Social Gatherings with Friends and Family:   . Attends Religious Services:   . Active Member of Clubs or Organizations:   . Attends Archivist Meetings:   [name] Marital Status:   Intimate Partner Violence: Unknown  . Fear of Current or Ex-Partner: No  . Emotionally Abused: Not on file  . Physically Abused: Not on file  . Sexually Abused: Not on file    Outpatient Medications Prior to Visit  Medication Sig Dispense Refill  . ADVAIR DISKUS 250-50 MCG/DOSE AEPB USE 1 INHALATION BY MOUTH  TWICE DAILY 180 each 3  . alendronate (FOSAMAX) 70 MG tablet Take 1 tablet (70 mg total) by mouth every 7 (seven) days. Take with a full glass of water on an empty stomach. 12 tablet 4  . AMBULATORY NON  FORMULARY MEDICATION Medication Name: Tubing and supplies for nebulizer. Fax - [phone] 1 vial 0  . amiodarone (PACERONE) 200 MG tablet Take 200 mg by mouth 2 (two) times daily.    . blood glucose meter kit and supplies KIT Dispense based on patient and insurance preference. 1 each 0  . bumetanide (BUMEX) 1 MG tablet TAKE 1 TABLET BY MOUTH  TWICE DAILY 180 tablet 3  . carvedilol (COREG) 12.5 MG tablet Take by  mouth.    . cetirizine (ZYRTEC) 10 MG tablet Take 10 mg by mouth daily.    [name] dicyclomine (BENTYL) 20 MG tablet Take by mouth.    . EPIPEN 2-PAK 0.3 MG/0.3ML SOAJ injection INJECT 1 PEN INTO MUSCLE OF LATERAL THIGH FOR SEVERE ALLERGIC REACTION. CALL 911 AFTER USING THIS MEDICATION. 1 each 2  . glucose blood (ACCU-CHEK GUIDE) test strip Dx DM E11.8 - Check fasting blood sugar every morning and once 2 hours after largest meal of the day. 300 each prn  . insulin lispro (HUMALOG KWIKPEN) 100 UNIT/ML KwikPen Inject 0.03-0.12 mLs (3-12 Units total) into the skin 3 (three) times daily as needed (hyperglycemia>150). Sliding scale. Administer subQ within 15 minutes before or immediately after a meal 9 mL 2  . Insulin Pen Needle 31G X 8 MM MISC To be used for injecting insulin Dx: E11.8 300 each 4  . Lancets Misc. (ACCU-CHEK MULTICLIX LANCET DEV) KIT Test twice a day. Dx:E11.8 1 kit 0  . LANTUS SOLOSTAR 100 UNIT/ML Solostar Pen INJECT SUBCUTANEOUSLY 40  UNITS AT BEDTIME 45 mL 3  . levothyroxine (SYNTHROID) 75 MCG tablet TAKE 1 TABLET BY MOUTH  DAILY 90 tablet 3  . LINZESS 145 MCG CAPS capsule Take 145 mcg by mouth daily.    [name] lisinopril (ZESTRIL) 2.5 MG tablet Take 2.5 mg by mouth daily.    . Multiple Vitamins-Minerals (OCUVITE EYE + MULTI) TABS Take 1 tablet by mouth daily.    . Olopatadine HCl 0.2 % SOLN Place 1 drop into both eyes daily.    . pantoprazole (PROTONIX) 40 MG tablet Take 40 mg by mouth daily.    . pravastatin (PRAVACHOL) 40 MG tablet TAKE 1 TABLET BY MOUTH AT  BEDTIME 90 tablet 3   . PROAIR HFA 108 (90 Base) MCG/ACT inhaler USE 1 TO 2 INHALATIONS BY  MOUTH INTO THE LUNGS EVERY  6 HOURS AS NEEDED FOR  WHEEZING OR SHORTNESS OF  BREATH 18 g 4  . RESTASIS 0.05 % ophthalmic emulsion     . tiotropium (SPIRIVA HANDIHALER) 18 MCG inhalation capsule Spiriva with HandiHaler 18 mcg and inhalation capsules 30 capsule 0  . valACYclovir (VALTREX) 1000 MG tablet TAKE 1 TABLET BY MOUTH TWICE DAILY 14 tablet 2  . BYSTOLIC 10 MG tablet TAKE 1 TABLET BY MOUTH  DAILY 90 tablet 3  . diltiazem (CARDIZEM) 120 MG tablet TAKE 1 TABLET BY MOUTH  DAILY 90 tablet 3  . doxycycline (VIBRA-TABS) 100 MG tablet Take 1 tablet (100 mg total) by mouth 2 (two) times daily. 20 tablet 0  . predniSONE (DELTASONE) 20 MG tablet Take 2 tablets (40 mg total) by mouth daily with breakfast. 10 tablet 0   No facility-administered medications prior to visit.    Allergies  Allergen Reactions  . Penicillins Anaphylaxis    Has patient had a PCN reaction causing immediate rash, facial/tongue/throat swelling, SOB or lightheadedness with hypotension: Yes Has patient had a PCN reaction causing severe rash involving mucus membranes or skin necrosis: No Has patient had a PCN reaction that required hospitalization: No Has patient had a PCN reaction occurring within the last 10 years: No If all of the above answers are "NO", then may proceed with Cephalosporin use.   [name] Hcl] Other (See Comments)    Hyperkalemia  . Atorvastatin Other (See Comments)    msucle aches  . Celebrex [Celecoxib]     ulcers  . Fluticasone Other (See Comments)    Nasal ulcer.    [name]  Levocetirizine     Doesn't recall  . Losartan Potassium Itching  . Naproxen Swelling  . Nasonex [Mometasone Furoate] Other (See Comments)    nosebleed  . Aspirin Other (See Comments)    Pt states "burns holes in stomach", ulcers   . Baking Soda-Fluoride [Sodium Fluoride] Itching  . Clindamycin/Lincomycin Rash  . Codeine Nausea And Vomiting  .  Furosemide Other (See Comments)    Nose bleed  . Iodinated Diagnostic Agents Itching and Rash    Pt states she has had ultrasound with dye but was fine  . Lactose   . Lactose Intolerance (Gi) Other (See Comments)    Increased calcium, sneezing, nasal issues  . Lanolin-Petrolatum Rash  . Metformin And Related Diarrhea  . Miralax [Polyethylene Glycol 3350]   . Miralax [Polyethylene Glycol] Rash  . Nasonex [Mometasone Furoate] Other (See Comments)  . Quinine Rash  . Rifampin Other (See Comments)    DOES NOT REMEMBER THIS MEDICATION OR ALLERGY  . Sulfa Antibiotics Rash  . Sulfonamide Derivatives Rash  . Tramadol Rash    Anxiety  . Wellbutrin [Bupropion] Other (See Comments)    Muscle aches, pain, "made my skin crawl"  . Wool Alcohol [Lanolin] Rash  . Zoledronic Acid Other (See Comments) and Rash    shaking    ROS Review of Systems    Objective:    Physical Exam Constitutional:      Appearance: She is well-developed.  HENT:     Head: Normocephalic and atraumatic.  Cardiovascular:     Rate and Rhythm: Normal rate and regular rhythm.     Heart sounds: Normal heart sounds.  Pulmonary:     Effort: Pulmonary effort is normal.     Breath sounds: Normal breath sounds.  Musculoskeletal:     Comments: Trace ankle edema bilaterally.  Skin:    General: Skin is warm and dry.     Comments: She has calluses on the lateral edge of the great toes bilaterally as well as the fifth toes bilaterally.  She also has erythema and increased warmth over her fifth toes.  The skin appears to be shiny with hair loss on the top of the foot.  Diminished dorsal pedal and posterior tibial pulses.  Neurological:     Mental Status: She is alert and oriented to person, place, and time.  Psychiatric:        Behavior: Behavior normal.     Diabetic Foot Form - Detailed   Diabetic Foot Exam - detailed Diabetic Foot exam was performed with the following findings: Yes 02/13/2020  2:40 PM  Can the patient  see the bottom of their feet?: No Are the shoes appropriate in style and fit?: Yes Is there swelling or and abnormal foot shape?: No Is there a claw toe deformity?: No Is there elevated skin temparature?: Yes Is there foot or ankle muscle weakness?: No Normal Range of Motion: Yes Right posterior Tibialias: Diminished Left posterior Tibialias: Diminished  Right Dorsalis Pedis: Diminished Left Dorsalis Pedis: Diminished  Semmes-Weinstein Monofilament Test R Site 1-Great Toe: Neg L Site 1-Great Toe: Neg         BP (!) 122/59   Pulse 69   Ht 5' 2"  (1.575 m)   Wt 212 lb (96.2 kg)   SpO2 96%   BMI 38.78 kg/m  Wt Readings from Last 3 Encounters:  02/13/20 212 lb (96.2 kg)  01/16/20 209 lb (94.8 kg)  01/09/20 210 lb (95.3 kg)     Health Maintenance Due  Topic Date Due  . OPHTHALMOLOGY EXAM  04/08/2019  . COLONOSCOPY  06/13/2019    There are no preventive care reminders to display for this patient.  Lab Results  Component Value Date   TSH 1.27 01/09/2020   Lab Results  Component Value Date   WBC 6.5 03/14/2019   HGB 14.1 03/14/2019   HCT 42.7 03/14/2019   MCV 92.0 03/14/2019   PLT 287 03/14/2019   Lab Results  Component Value Date   NA 139 10/20/2019   K 4.2 10/20/2019   CHLORIDE 105 10/30/2014   CO2 25 10/20/2019   GLUCOSE 102 (H) 10/20/2019   BUN 17 10/20/2019   CREATININE 0.82 10/20/2019   BILITOT 0.3 10/20/2019   ALKPHOS 100 03/02/2018   AST 19 10/20/2019   ALT 15 10/20/2019   PROT 6.4 10/20/2019   ALBUMIN 4.0 03/02/2018   CALCIUM 10.6 (H) 10/20/2019   ANIONGAP 8 03/11/2018   EGFR 78 (L) 10/30/2014   Lab Results  Component Value Date   CHOL 164 04/11/2019   Lab Results  Component Value Date   HDL 68 04/11/2019   Lab Results  Component Value Date   LDLCALC 76 04/11/2019   Lab Results  Component Value Date   TRIG 121 04/11/2019   Lab Results  Component Value Date   CHOLHDL 2.4 04/11/2019   Lab Results  Component Value Date   HGBA1C  6.1 (A) 10/20/2019      Assessment & Plan:   Problem List Items Addressed This Visit      Cardiovascular and Mediastinum   Paroxysmal VT ([place]) - Primary   Relevant Medications   lisinopril (ZESTRIL) 2.5 MG tablet   carvedilol (COREG) 12.5 MG tablet   amiodarone (PACERONE) 200 MG tablet   Other Relevant Orders   Ambulatory referral to Cardiology   Chronic systolic heart failure (HCC)    Recent EF of 35 to 40%.  Bystolic was discontinued in the hospital but she is on carvedilol.  She is intolerant to ACEs and arms.  She does look a little volume overloaded on exam today so we will increase Bumex temporarily.      Relevant Medications   lisinopril (ZESTRIL) 2.5 MG tablet   carvedilol (COREG) 12.5 MG tablet   amiodarone (PACERONE) 200 MG tablet     Endocrine   Hypothyroidism    Recent TSH was at goal.  Continue current regimen.      Relevant Medications   carvedilol (COREG) 12.5 MG tablet     Other   Peripheral edema    She does have ankle swelling on exam today.  Discussed increasing her Bumex from twice a day to adding a third tab in the middle of the day for the next 2 to 3 days.  And checking her weights daily to see if this helps.  Might even improve some of her shortness of breath.       Other Visit Diagnoses    Bruising          Bruising should improve relatively soon.  Nothing worrisome on exam today.  I did ask her to bring all her medications at the next office visit to make sure that what we have her taking is what she is actually taking.  I do feel like there may be a little bit of confusion on a couple of these medications and I just want to make sure that were all clear.  No orders of the defined types were placed in this encounter.  Follow-up: Return in about 4 weeks (around 03/12/2020).    Beatrice Lecher, MD

## 2020-02-14 ENCOUNTER — Encounter: Payer: Self-pay | Admitting: Family Medicine

## 2020-02-14 DIAGNOSIS — I4729 Other ventricular tachycardia: Secondary | ICD-10-CM | POA: Insufficient documentation

## 2020-02-14 DIAGNOSIS — E118 Type 2 diabetes mellitus with unspecified complications: Secondary | ICD-10-CM | POA: Diagnosis not present

## 2020-02-14 NOTE — Assessment & Plan Note (Signed)
Recent EF of 35 to 40%.  Bystolic was discontinued in the hospital but she is on carvedilol.  She is intolerant to ACEs and arms.  She does look a little volume overloaded on exam today so we will increase Bumex temporarily.

## 2020-02-14 NOTE — Assessment & Plan Note (Signed)
Recent TSH was at goal.  Continue current regimen.

## 2020-02-14 NOTE — Assessment & Plan Note (Signed)
She does have ankle swelling on exam today.  Discussed increasing her Bumex from twice a day to adding a third tab in the middle of the day for the next 2 to 3 days.  And checking her weights daily to see if this helps.  Might even improve some of her shortness of breath.

## 2020-02-23 ENCOUNTER — Other Ambulatory Visit: Payer: Self-pay

## 2020-02-23 ENCOUNTER — Ambulatory Visit: Payer: Medicare Other | Admitting: Internal Medicine

## 2020-02-23 VITALS — BP 126/70 | HR 73 | Ht 62.0 in | Wt 213.8 lb

## 2020-02-23 DIAGNOSIS — I5022 Chronic systolic (congestive) heart failure: Secondary | ICD-10-CM

## 2020-02-23 DIAGNOSIS — I10 Essential (primary) hypertension: Secondary | ICD-10-CM

## 2020-02-23 DIAGNOSIS — I442 Atrioventricular block, complete: Secondary | ICD-10-CM

## 2020-02-23 MED ORDER — AMIODARONE HCL 200 MG PO TABS
100.0000 mg | ORAL_TABLET | Freq: Every day | ORAL | 3 refills | Status: DC
Start: 2020-02-23 — End: 2021-04-15

## 2020-02-23 NOTE — Patient Instructions (Addendum)
Medication Instructions:  Your physician has recommended you make the following change in your medication:  1 reduce your Amio 263m - take a 1/2 tablet (1059m by mouth daily 2 stop pravastatin   *If you need a refill on your cardiac medications before your next appointment, please call your pharmacy*  Lab Work: None ordered.  If you have labs (blood work) drawn today and your tests are completely normal, you will receive your results only by: . Marland KitchenyChart Message (if you have MyChart) OR . A paper copy in the mail If you have any lab test that is abnormal or we need to change your treatment, we will call you to review the results.  Testing/Procedures: .none  Follow-Up: [facility] and your health needs are our priority.  As part of our continuing mission to provide you with exceptional heart care, we have created designated Provider Care Teams.  These Care Teams include your primary Cardiologist (physician) and Advanced Practice Providers (APPs -  Physician Assistants and Nurse Practitioners) who all work together to provide you with the care you need, when you need it.  We recommend signing up for the patient portal called "MyChart".  Sign up information is provided on this After Visit Summary.  MyChart is used to connect with patients for Virtual Visits (Telemedicine).  Patients are able to view lab/test results, encounter notes, upcoming appointments, etc.  Non-urgent messages can be sent to your provider as well.   To learn more about what you can do with MyChart, go to [email or web address]   Your next appointment:   Your physician wants you to follow-up in: 6 months with Dr. TaLovena LeYou will receive a reminder letter in the mail two months in advance. If you don't receive a letter, please call our office to schedule the follow-up appointment.  Remote monitoring is used to monitor your Pacemaker  from home. This monitoring reduces the number of office visits required to  check your device to one time per year. It allows usKoreao keep an eye on the functioning of your device to ensure it is working properly. You are scheduled for a device check from home on 04/01/2020. You may send your transmission at any time that day. If you have a wireless device, the transmission will be sent automatically. After your physician reviews your transmission, you will receive a postcard with your next transmission date.  Other Instructions:

## 2020-02-23 NOTE — Progress Notes (Signed)
HPI Mrs. Mcdaris returns today for followup. She is a pleasant  69 yo woman with chronic systolic heart failure and CHB, s/p Biv PPM insertion. She has had some increased thresholds but has been stable from a cardiac perspective. She has breast CA. She is s/p surgery. She has also had a breast resection.No syncope. She was in the hospital with a CHF exacerbation a couple of weeks ago. She had NSVT and was placed on amiodarone which she does not tolerate. She has not had angina. Her left cath demonstrated no CAD. Her ef by echo was 35-40%. He is on coreg and amiodarone. She had been intolerant to an ACE inhibitor.    Allergies  Allergen Reactions  . Penicillins Anaphylaxis    Has patient had a PCN reaction causing immediate rash, facial/tongue/throat swelling, SOB or lightheadedness with hypotension: Yes Has patient had a PCN reaction causing severe rash involving mucus membranes or skin necrosis: No Has patient had a PCN reaction that required hospitalization: No Has patient had a PCN reaction occurring within the last 10 years: No If all of the above answers are "NO", then may proceed with Cephalosporin use.   [name] Hcl] Other (See Comments)    Hyperkalemia  . Atorvastatin Other (See Comments)    msucle aches  . Celebrex [Celecoxib]     ulcers  . Fluticasone Other (See Comments)    Nasal ulcer.    . Levocetirizine     Doesn't recall  . Losartan Potassium Itching  . Naproxen Swelling  . Nasonex [Mometasone Furoate] Other (See Comments)    nosebleed  . Aspirin Other (See Comments)    Pt states "burns holes in stomach", ulcers   . Baking Soda-Fluoride [Sodium Fluoride] Itching  . Clindamycin/Lincomycin Rash  . Codeine Nausea And Vomiting  . Furosemide Other (See Comments)    Nose bleed  . Iodinated Diagnostic Agents Itching and Rash    Pt states she has had ultrasound with dye but was fine  . Lactose   . Lactose Intolerance (Gi) Other (See Comments)     Increased calcium, sneezing, nasal issues  . Lanolin-Petrolatum Rash  . Metformin And Related Diarrhea  . Miralax [Polyethylene Glycol 3350]   . Miralax [Polyethylene Glycol] Rash  . Nasonex [Mometasone Furoate] Other (See Comments)  . Quinine Rash  . Rifampin Other (See Comments)    DOES NOT REMEMBER THIS MEDICATION OR ALLERGY  . Sulfa Antibiotics Rash  . Sulfonamide Derivatives Rash  . Tramadol Rash    Anxiety  . Wellbutrin [Bupropion] Other (See Comments)    Muscle aches, pain, "made my skin crawl"  . Wool Alcohol [Lanolin] Rash  . Zoledronic Acid Other (See Comments) and Rash    shaking     Current Outpatient Medications  Medication Sig Dispense Refill  . ADVAIR DISKUS 250-50 MCG/DOSE AEPB USE 1 INHALATION BY MOUTH  TWICE DAILY 180 each 3  . alendronate (FOSAMAX) 70 MG tablet Take 1 tablet (70 mg total) by mouth every 7 (seven) days. Take with a full glass of water on an empty stomach. 12 tablet 4  . AMBULATORY NON FORMULARY MEDICATION Medication Name: Tubing and supplies for nebulizer. Fax - [phone] 1 vial 0  . amiodarone (PACERONE) 200 MG tablet Take 200 mg by mouth daily.     . blood glucose meter kit and supplies KIT Dispense based on patient and insurance preference. 1 each 0  . bumetanide (BUMEX) 1 MG tablet TAKE 1 TABLET BY MOUTH  TWICE DAILY 180 tablet 3  . carvedilol (COREG) 12.5 MG tablet Take 12.5 mg by mouth 2 (two) times daily with a meal.     . cetirizine (ZYRTEC) 10 MG tablet Take 10 mg by mouth daily.    [name] dicyclomine (BENTYL) 20 MG tablet Take by mouth.    . EPIPEN 2-PAK 0.3 MG/0.3ML SOAJ injection INJECT 1 PEN INTO MUSCLE OF LATERAL THIGH FOR SEVERE ALLERGIC REACTION. CALL 911 AFTER USING THIS MEDICATION. 1 each 2  . glucose blood (ACCU-CHEK GUIDE) test strip Dx DM E11.8 - Check fasting blood sugar every morning and once 2 hours after largest meal of the day. 300 each prn  . insulin lispro (HUMALOG KWIKPEN) 100 UNIT/ML KwikPen Inject 0.03-0.12 mLs (3-12  Units total) into the skin 3 (three) times daily as needed (hyperglycemia>150). Sliding scale. Administer subQ within 15 minutes before or immediately after a meal 9 mL 2  . Insulin Pen Needle 31G X 8 MM MISC To be used for injecting insulin Dx: E11.8 300 each 4  . Lancets Misc. (ACCU-CHEK MULTICLIX LANCET DEV) KIT Test twice a day. Dx:E11.8 1 kit 0  . LANTUS SOLOSTAR 100 UNIT/ML Solostar Pen INJECT SUBCUTANEOUSLY 40  UNITS AT BEDTIME 45 mL 3  . levothyroxine (SYNTHROID) 75 MCG tablet TAKE 1 TABLET BY MOUTH  DAILY 90 tablet 3  . LINZESS 145 MCG CAPS capsule Take 145 mcg by mouth daily.    . Multiple Vitamins-Minerals (OCUVITE EYE + MULTI) TABS Take 1 tablet by mouth daily.    . Olopatadine HCl 0.2 % SOLN Place 1 drop into both eyes daily.    . pantoprazole (PROTONIX) 40 MG tablet Take 40 mg by mouth daily.    . pravastatin (PRAVACHOL) 40 MG tablet TAKE 1 TABLET BY MOUTH AT  BEDTIME 90 tablet 3  . PROAIR HFA 108 (90 Base) MCG/ACT inhaler USE 1 TO 2 INHALATIONS BY  MOUTH INTO THE LUNGS EVERY  6 HOURS AS NEEDED FOR  WHEEZING OR SHORTNESS OF  BREATH 18 g 4  . RESTASIS 0.05 % ophthalmic emulsion     . tiotropium (SPIRIVA HANDIHALER) 18 MCG inhalation capsule Spiriva with HandiHaler 18 mcg and inhalation capsules 30 capsule 0  . valACYclovir (VALTREX) 1000 MG tablet TAKE 1 TABLET BY MOUTH TWICE DAILY 14 tablet 2   No current facility-administered medications for this visit.     Past Medical History:  Diagnosis Date  . Alkaline phosphatase elevation   . Asthma   . Breast cancer ([place]) 2011  . Carpal tunnel syndrome   . Carpal tunnel syndrome    bilateral  . CHF (congestive heart failure) ([place])   . Cirrhosis, non-alcoholic ([place])   . Closed fracture of unspecified part of radius (alone)   . DDD (degenerative disc disease), lumbar   . Depression    pt denies  . Diabetes mellitus without complication ([place])   . Enlarged heart   . Fibromyalgia   . GERD (gastroesophageal reflux disease)   .  History of kidney stones   . HTN (hypertension)   . Hypothyroidism   . IBS (irritable bowel syndrome)   . LBBB (left bundle branch block)   . Microcytic anemia 05/14/2017  . Pneumonia   . Presence of permanent cardiac pacemaker   . PVD (peripheral vascular disease) ([place])    2019  . Sleep apnea    uses cpap    ROS:   All systems reviewed and negative except as noted in the HPI.   Past Surgical History:  Procedure Laterality Date  . BREAST LUMPECTOMY Left 2011  . COLONOSCOPY    . FOOT SURGERY    . KNEE SURGERY    . MASTECTOMY Right    05/2019  . PACEMAKER GENERATOR CHANGE N/A 08/23/2014   Procedure: PACEMAKER GENERATOR CHANGE;  Surgeon: Evans Lance, MD;  Location: [facility] CATH LAB;  Service: Cardiovascular;  Laterality: N/A;  . PACEMAKER PLACEMENT  2009   [facility]  . RECTAL SURGERY  1976  . REDUCTION MAMMAPLASTY Left    05/2019  . TOTAL SHOULDER ARTHROPLASTY Right 03/10/2018  . TOTAL SHOULDER ARTHROPLASTY Right 03/10/2018   Procedure: RIGHT TOTAL SHOULDER ARTHROPLASTY;  Surgeon: [name], MD;  Location: [facility];  Service: Orthopedics;  Laterality: Right;  . TUBAL LIGATION       Family History  Problem Relation Age of Onset  . Melanoma Mother   . Parkinson's disease Mother   . Heart failure Other   . Breast cancer Other   . Hypertension Other      Social History   Socioeconomic History  . Marital status: Divorced    Spouse name: Not on file  . Number of children: 2  . Years of education: 71  . Highest education level: Associate degree: academic program  Occupational History  . 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 Smoking status: Former Smoker    Packs/day: 0.00    Years: 0.00    Pack years: 0.00    Quit date: 08/24/1966    Years since quitting: 53.5  . Smokeless tobacco: Never Used  Vaping Use  . Vaping Use: Never used  Substance and Sexual Activity  . Alcohol use: No  . Drug use: No  .  Sexual activity: Not Currently  Other Topics Concern  . Not on file  Social History Narrative   Retired. 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 Likes Systems developer and music. Loves animals.   Social Determinants of Health   Financial Resource Strain:   . Difficulty of Paying Living Expenses:   Food Insecurity:   . Worried About Charity fundraiser in the Last Year:   . Arboriculturist in the Last Year:   Transportation Needs: No Transportation Needs  . Lack of Transportation (Medical): No  . Lack of Transportation (Non-Medical): No  Physical Activity:   . Days of Exercise per Week:   . Minutes of Exercise per Session:   Stress:   . Feeling of Stress :   Social Connections:   . Frequency of Communication with Friends and Family:   . Frequency of Social Gatherings with Friends and Family:   . Attends Religious Services:   . Active Member of Clubs or Organizations:   . Attends Archivist Meetings:   [name] Marital Status:   Intimate Partner Violence: Unknown  . Fear of Current or Ex-Partner: No  . Emotionally Abused: Not on file  . Physically Abused: Not on file  . Sexually Abused: Not on file     BP 126/70   Pulse 73   Ht 5' 2"  (1.575 m)   Wt 213 lb 12.8 oz (97 kg)   SpO2 97%   BMI 39.10 kg/m   Physical Exam:  obese appearing 69 yo woman, NAD HEENT: Unremarkable Neck:  No JVD, no thyromegally Lymphatics:  No adenopathy Back:  No CVA tenderness Lungs:  Clear with no wheezes HEART:  Regular rate rhythm, no murmurs, no rubs, no clicks  Abd:  soft, positive bowel sounds, no organomegally, no rebound, no guarding Ext:  2 plus pulses, no edema, no cyanosis, no clubbing Skin:  No rashes no nodules Neuro:  CN II through XII intact, motor grossly intact  EKG - nsr with biv pacing  DEVICE  Normal device function.  See PaceArt for details. chtronically elevated RV and LV thresholds  Assess/Plan: 1. Chronic systolic heart failure - I encouraged her to take an  extra bumex if her weight is above 210.  2. Obesity - she is encouraged to lose weight. 3. PVC's - she will reduce her dose of amiodarone.  4. HTN -her bp is controlled. She will maintain a low sodium diet.  Mikle Bosworth.D

## 2020-02-28 DIAGNOSIS — Z471 Aftercare following joint replacement surgery: Secondary | ICD-10-CM | POA: Diagnosis not present

## 2020-02-28 DIAGNOSIS — Z96611 Presence of right artificial shoulder joint: Secondary | ICD-10-CM | POA: Diagnosis not present

## 2020-02-29 ENCOUNTER — Inpatient Hospital Stay: Payer: Medicare Other | Admitting: Family Medicine

## 2020-03-11 ENCOUNTER — Other Ambulatory Visit: Payer: Self-pay

## 2020-03-11 ENCOUNTER — Other Ambulatory Visit: Payer: Self-pay | Admitting: Family Medicine

## 2020-03-11 ENCOUNTER — Encounter: Payer: Self-pay | Admitting: Family Medicine

## 2020-03-11 ENCOUNTER — Ambulatory Visit (INDEPENDENT_AMBULATORY_CARE_PROVIDER_SITE_OTHER): Payer: Medicare Other | Admitting: Family Medicine

## 2020-03-11 VITALS — BP 125/57 | HR 79 | Ht 62.0 in | Wt 219.0 lb

## 2020-03-11 DIAGNOSIS — R600 Localized edema: Secondary | ICD-10-CM

## 2020-03-11 DIAGNOSIS — E118 Type 2 diabetes mellitus with unspecified complications: Secondary | ICD-10-CM | POA: Diagnosis not present

## 2020-03-11 DIAGNOSIS — R609 Edema, unspecified: Secondary | ICD-10-CM

## 2020-03-11 DIAGNOSIS — I5022 Chronic systolic (congestive) heart failure: Secondary | ICD-10-CM | POA: Diagnosis not present

## 2020-03-11 DIAGNOSIS — F324 Major depressive disorder, single episode, in partial remission: Secondary | ICD-10-CM

## 2020-03-11 DIAGNOSIS — I10 Essential (primary) hypertension: Secondary | ICD-10-CM

## 2020-03-11 LAB — POCT GLYCOSYLATED HEMOGLOBIN (HGB A1C): Hemoglobin A1C: 6.3 % — AB (ref 4.0–5.6)

## 2020-03-11 NOTE — Progress Notes (Signed)
=  BS:124  Pt reports that she has only taken the Humalog once this week. She also stated that sometimes the Advair has a "sweet" taste

## 2020-03-11 NOTE — Progress Notes (Signed)
Established Patient Office Visit  Subjective:  Patient ID: Tanya Hogan, female    DOB: 11/04/1950  Age: 69 y.o. MRN: [id number]  CC:  Chief Complaint  Patient presents with  . Diabetes  . Hypertension    HPI Tanya Hogan presents for   Hypertension- Pt denies chest pain, SOB, dizziness, or heart palpitations.  Taking meds as directed w/o problems.  Denies medication side effects.    Diabetes - no hypoglycemic events. No wounds or sores that are not healing well. No increased thirst or urination. Checking glucose at home. Taking medications as prescribed without any side effects.  She says her irritable bowel has actually been better in fact she is no longer using the Linzess daily she is just using it more as needed.  She tries to avoid certain foods that she knows are big triggers for her got such as ice cream.  We discussed the importance of cutting back on sugars and carbs.  Saw Dr. Cristopher Hogan her cards recently for her Complete heart block s/P Bi VP.  She says that Dr. Lovena Hogan actually stopped her statin.  Past Medical History:  Diagnosis Date  . Alkaline phosphatase elevation   . Asthma   . Breast cancer ([place]) 2011  . Carpal tunnel syndrome   . Carpal tunnel syndrome    bilateral  . CHF (congestive heart failure) ([place])   . Cirrhosis, non-alcoholic ([place])   . Closed fracture of unspecified part of radius (alone)   . DDD (degenerative disc disease), lumbar   . Depression    pt denies  . Diabetes mellitus without complication ([place])   . Enlarged heart   . Fibromyalgia   . GERD (gastroesophageal reflux disease)   . History of kidney stones   . HTN (hypertension)   . Hypothyroidism   . IBS (irritable bowel syndrome)   . LBBB (left bundle branch block)   . Microcytic anemia 05/14/2017  . Pneumonia   . Presence of permanent cardiac pacemaker   . PVD (peripheral vascular disease) ([place])    2019  . Sleep apnea    uses cpap    Past Surgical History:  Procedure  Laterality Date  . BREAST LUMPECTOMY Left 2011  . COLONOSCOPY    . FOOT SURGERY    . KNEE SURGERY    . MASTECTOMY Right    05/2019  . PACEMAKER GENERATOR CHANGE N/A 08/23/2014   Procedure: PACEMAKER GENERATOR CHANGE;  Surgeon: Evans Lance, MD;  Location: [facility] CATH LAB;  Service: Cardiovascular;  Laterality: N/A;  . PACEMAKER PLACEMENT  2009   [facility]  . RECTAL SURGERY  1976  . REDUCTION MAMMAPLASTY Left    05/2019  . TOTAL SHOULDER ARTHROPLASTY Right 03/10/2018  . TOTAL SHOULDER ARTHROPLASTY Right 03/10/2018   Procedure: RIGHT TOTAL SHOULDER ARTHROPLASTY;  Surgeon: [name], MD;  Location: [facility];  Service: Orthopedics;  Laterality: Right;  . TUBAL LIGATION      Family History  Problem Relation Age of Onset  . Melanoma Mother   . Parkinson's disease Mother   . Heart failure Other   . Breast cancer Other   . Hypertension Other     Social History   Socioeconomic History  . Marital status: Divorced    Spouse name: Not on file  . Number of children: 2  . Years of education: 80  . Highest education level: Associate degree: academic program  Occupational History  . 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 Smoking status: Former Smoker    Packs/day: 0.00    Years: 0.00    Pack years: 0.00    Quit date: 08/24/1966    Years since quitting: 53.5  . Smokeless tobacco: Never Used  Vaping Use  . Vaping Use: Never used  Substance and Sexual Activity  . Alcohol use: No  . Drug use: No  . Sexual activity: Not Currently  Other Topics Concern  . Not on file  Social History Narrative   Retired. 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 Likes Systems developer and music. Loves animals.   Social Determinants of Health   Financial Resource Strain:   . Difficulty of Paying Living Expenses:   Food Insecurity:   . Worried About Charity fundraiser in the Last Year:   . Arboriculturist in the Last Year:    Transportation Needs: No Transportation Needs  . Lack of Transportation (Medical): No  . Lack of Transportation (Non-Medical): No  Physical Activity:   . Days of Exercise per Week:   . Minutes of Exercise per Session:   Stress:   . Feeling of Stress :   Social Connections:   . Frequency of Communication with Friends and Family:   . Frequency of Social Gatherings with Friends and Family:   . Attends Religious Services:   . Active Member of Clubs or Organizations:   . Attends Archivist Meetings:   [name] Marital Status:   Intimate Partner Violence: Unknown  . Fear of Current or Ex-Partner: No  . Emotionally Abused: Not on file  . Physically Abused: Not on file  . Sexually Abused: Not on file    Outpatient Medications Prior to Visit  Medication Sig Dispense Refill  . ADVAIR DISKUS 250-50 MCG/DOSE AEPB USE 1 INHALATION BY MOUTH  TWICE DAILY 180 each 3  . alendronate (FOSAMAX) 70 MG tablet Take 1 tablet (70 mg total) by mouth every 7 (seven) days. Take with a full glass of water on an empty stomach. 12 tablet 4  . AMBULATORY NON FORMULARY MEDICATION Medication Name: Tubing and supplies for nebulizer. Fax - [phone] 1 vial 0  . amiodarone (PACERONE) 200 MG tablet Take 0.5 tablets (100 mg total) by mouth daily. 45 tablet 3  . blood glucose meter kit and supplies KIT Dispense based on patient and insurance preference. 1 each 0  . bumetanide (BUMEX) 1 MG tablet TAKE 1 TABLET BY MOUTH  TWICE DAILY 180 tablet 3  . carvedilol (COREG) 12.5 MG tablet Take 12.5 mg by mouth 2 (two) times daily with a meal.     . cetirizine (ZYRTEC) 10 MG tablet Take 10 mg by mouth daily.    [name] dicyclomine (BENTYL) 20 MG tablet Take by mouth.    . EPIPEN 2-PAK 0.3 MG/0.3ML SOAJ injection INJECT 1 PEN INTO MUSCLE OF LATERAL THIGH FOR SEVERE ALLERGIC REACTION. CALL 911 AFTER USING THIS MEDICATION. 1 each 2  . glucose blood (ACCU-CHEK GUIDE) test strip Dx DM E11.8 - Check fasting blood sugar every morning  and once 2 hours after largest meal of the day. 300 each prn  . insulin lispro (HUMALOG KWIKPEN) 100 UNIT/ML KwikPen Inject 0.03-0.12 mLs (3-12 Units total) into the skin 3 (three) times daily as needed (hyperglycemia>150). Sliding scale. Administer subQ within 15 minutes before or immediately after a meal 9 mL 2  . Insulin Pen Needle 31G X 8 MM MISC To be used for injecting insulin Dx: E11.8 300 each  4  . Lancets Misc. (ACCU-CHEK MULTICLIX LANCET DEV) KIT Test twice a day. Dx:E11.8 1 kit 0  . LANTUS SOLOSTAR 100 UNIT/ML Solostar Pen INJECT SUBCUTANEOUSLY 40  UNITS AT BEDTIME 45 mL 3  . levothyroxine (SYNTHROID) 75 MCG tablet TAKE 1 TABLET BY MOUTH  DAILY 90 tablet 3  . LINZESS 145 MCG CAPS capsule Take 145 mcg by mouth daily.    . Multiple Vitamins-Minerals (OCUVITE EYE + MULTI) TABS Take 1 tablet by mouth daily.    . Olopatadine HCl 0.2 % SOLN Place 1 drop into both eyes daily.    . pantoprazole (PROTONIX) 40 MG tablet Take 40 mg by mouth daily.    [name] PROAIR HFA 108 (90 Base) MCG/ACT inhaler USE 1 TO 2 INHALATIONS BY  MOUTH INTO THE LUNGS EVERY  6 HOURS AS NEEDED FOR  WHEEZING OR SHORTNESS OF  BREATH 18 g 4  . RESTASIS 0.05 % ophthalmic emulsion     . tiotropium (SPIRIVA HANDIHALER) 18 MCG inhalation capsule Spiriva with HandiHaler 18 mcg and inhalation capsules 30 capsule 0  . valACYclovir (VALTREX) 1000 MG tablet TAKE 1 TABLET BY MOUTH TWICE DAILY 14 tablet 2   No facility-administered medications prior to visit.    Allergies  Allergen Reactions  . Penicillins Anaphylaxis    Has patient had a PCN reaction causing immediate rash, facial/tongue/throat swelling, SOB or lightheadedness with hypotension: Yes Has patient had a PCN reaction causing severe rash involving mucus membranes or skin necrosis: No Has patient had a PCN reaction that required hospitalization: No Has patient had a PCN reaction occurring within the last 10 years: No If all of the above answers are "NO", then may proceed  with Cephalosporin use.   [name] Hcl] Other (See Comments)    Hyperkalemia  . Atorvastatin Other (See Comments)    msucle aches  . Celebrex [Celecoxib]     ulcers  . Fluticasone Other (See Comments)    Nasal ulcer.    . Levocetirizine     Doesn't recall  . Losartan Potassium Itching  . Naproxen Swelling  . Nasonex [Mometasone Furoate] Other (See Comments)    nosebleed  . Aspirin Other (See Comments)    Pt states "burns holes in stomach", ulcers   . Baking Soda-Fluoride [Sodium Fluoride] Itching  . Clindamycin/Lincomycin Rash  . Codeine Nausea And Vomiting  . Furosemide Other (See Comments)    Nose bleed  . Iodinated Diagnostic Agents Itching and Rash    Pt states she has had ultrasound with dye but was fine  . Lactose   . Lactose Intolerance (Gi) Other (See Comments)    Increased calcium, sneezing, nasal issues  . Lanolin-Petrolatum Rash  . Metformin And Related Diarrhea  . Miralax [Polyethylene Glycol 3350]   . Miralax [Polyethylene Glycol] Rash  . Nasonex [Mometasone Furoate] Other (See Comments)  . Quinine Rash  . Rifampin Other (See Comments)    DOES NOT REMEMBER THIS MEDICATION OR ALLERGY  . Sulfa Antibiotics Rash  . Sulfonamide Derivatives Rash  . Tramadol Rash    Anxiety  . Wellbutrin [Bupropion] Other (See Comments)    Muscle aches, pain, "made my skin crawl"  . Wool Alcohol [Lanolin] Rash  . Zoledronic Acid Other (See Comments) and Rash    shaking    ROS Review of Systems    Objective:    Physical Exam Constitutional:      Appearance: She is well-developed.  HENT:     Head: Normocephalic and atraumatic.  Cardiovascular:  Rate and Rhythm: Normal rate and regular rhythm.     Heart sounds: Normal heart sounds.  Pulmonary:     Effort: Pulmonary effort is normal.     Breath sounds: Normal breath sounds.  Skin:    General: Skin is warm and dry.  Neurological:     Mental Status: She is alert and oriented to person, place, and  time.  Psychiatric:        Behavior: Behavior normal.     BP (!) 125/57   Pulse 79   Ht 5' 2"  (1.575 m)   Wt 219 lb (99.3 kg)   SpO2 98%   BMI 40.06 kg/m  Wt Readings from Last 3 Encounters:  03/11/20 219 lb (99.3 kg)  02/23/20 213 lb 12.8 oz (97 kg)  02/13/20 212 lb (96.2 kg)     Health Maintenance Due  Topic Date Due  . OPHTHALMOLOGY EXAM  04/08/2019  . COLONOSCOPY  06/13/2019    There are no preventive care reminders to display for this patient.  Lab Results  Component Value Date   TSH 1.27 01/09/2020   Lab Results  Component Value Date   WBC 6.5 03/14/2019   HGB 14.1 03/14/2019   HCT 42.7 03/14/2019   MCV 92.0 03/14/2019   PLT 287 03/14/2019   Lab Results  Component Value Date   NA 139 10/20/2019   K 4.2 10/20/2019   CHLORIDE 105 10/30/2014   CO2 25 10/20/2019   GLUCOSE 102 (H) 10/20/2019   BUN 17 10/20/2019   CREATININE 0.82 10/20/2019   BILITOT 0.3 10/20/2019   ALKPHOS 100 03/02/2018   AST 19 10/20/2019   ALT 15 10/20/2019   PROT 6.4 10/20/2019   ALBUMIN 4.0 03/02/2018   CALCIUM 10.6 (H) 10/20/2019   ANIONGAP 8 03/11/2018   EGFR 78 (L) 10/30/2014   Lab Results  Component Value Date   CHOL 164 04/11/2019   Lab Results  Component Value Date   HDL 68 04/11/2019   Lab Results  Component Value Date   LDLCALC 76 04/11/2019   Lab Results  Component Value Date   TRIG 121 04/11/2019   Lab Results  Component Value Date   CHOLHDL 2.4 04/11/2019   Lab Results  Component Value Date   HGBA1C 6.3 (A) 03/11/2020      Assessment & Plan:   Problem List Items Addressed This Visit      Cardiovascular and Mediastinum   Chronic systolic heart failure ([place])    She says that the Bumex worked well.  But she was just told to double up for couple days and then stop it.  Okay to restart 1 tab every other day also discussed the importance of salt restricting sounds like she has a fairly high salt intake.  Shoot for less than 2000 mg daily additional  handout provided.      Relevant Orders   Lipid panel   COMPLETE METABOLIC PANEL WITH GFR   Benign essential hypertension - Primary    Well controlled. Continue current regimen. Follow up in  6 mo      Relevant Orders   Lipid panel   COMPLETE METABOLIC PANEL WITH GFR     Endocrine   Controlled diabetes mellitus type 2 with complications (H[facility])    Q7R looks great today at 6.3.  Continue current regimen.  Continue to work on dietary choices and regular exercise.  Low up in 3 months.      Relevant Orders   POCT glycosylated hemoglobin (Hb A1C) (  Completed)   Lipid panel   COMPLETE METABOLIC PANEL WITH GFR     Other   Peripheral edema    Okay to use Bumex 1 tab every other day for the next week to see if she feels that that is helpful but also discussed the importance of eating a low-salt diet additional information and handout provided today.      Major depressive disorder    Overall doing well.  PHQ 2 was negative and GAD-7 score of 1.  She is not currently on any medication for mood we will continue to monitor.          No orders of the defined types were placed in this encounter.   Follow-up: Return in about 3 months (around 06/11/2020) for Diabetes follow-up.    Beatrice Lecher, MD

## 2020-03-11 NOTE — Assessment & Plan Note (Signed)
She says that the Bumex worked well.  But she was just told to double up for couple days and then stop it.  Okay to restart 1 tab every other day also discussed the importance of salt restricting sounds like she has a fairly high salt intake.  Shoot for less than 2000 mg daily additional handout provided.

## 2020-03-11 NOTE — Assessment & Plan Note (Signed)
Well controlled. Continue current regimen. Follow up in  6 mo  

## 2020-03-11 NOTE — Assessment & Plan Note (Signed)
Okay to use Bumex 1 tab every other day for the next week to see if she feels that that is helpful but also discussed the importance of eating a low-salt diet additional information and handout provided today.

## 2020-03-11 NOTE — Assessment & Plan Note (Signed)
Overall doing well.  PHQ 2 was negative and GAD-7 score of 1.  She is not currently on any medication for mood we will continue to monitor.

## 2020-03-11 NOTE — Patient Instructions (Signed)
DASH Eating Plan DASH stands for "Dietary Approaches to Stop Hypertension." The DASH eating plan is a healthy eating plan that has been shown to reduce high blood pressure (hypertension). It may also reduce your risk for type 2 diabetes, heart disease, and stroke. The DASH eating plan may also help with weight loss. What are tips for following this plan?  General guidelines  Avoid eating more than 2,300 mg (milligrams) of salt (sodium) a day. If you have hypertension, you may need to reduce your sodium intake to 1,500 mg a day.  Limit alcohol intake to no more than 1 drink a day for nonpregnant women and 2 drinks a day for men. One drink equals 12 oz of beer, 5 oz of wine, or 1 oz of hard liquor.  Work with your health care provider to maintain a healthy body weight or to lose weight. Ask what an ideal weight is for you.  Get at least 30 minutes of exercise that causes your heart to beat faster (aerobic exercise) most days of the week. Activities may include walking, swimming, or biking.  Work with your health care provider or diet and nutrition specialist (dietitian) to adjust your eating plan to your individual calorie needs. Reading food labels   Check food labels for the amount of sodium per serving. Choose foods with less than 5 percent of the Daily Value of sodium. Generally, foods with less than 300 mg of sodium per serving fit into this eating plan.  To find whole grains, look for the word "whole" as the first word in the ingredient list. Shopping  Buy products labeled as "low-sodium" or "no salt added."  Buy fresh foods. Avoid canned foods and premade or frozen meals. Cooking  Avoid adding salt when cooking. Use salt-free seasonings or herbs instead of table salt or sea salt. Check with your health care provider or pharmacist before using salt substitutes.  Do not fry foods. Cook foods using healthy methods such as baking, boiling, grilling, and broiling instead.  Cook with  heart-healthy oils, such as olive, canola, soybean, or sunflower oil. Meal planning  Eat a balanced diet that includes: ? 5 or more servings of fruits and vegetables each day. At each meal, try to fill half of your plate with fruits and vegetables. ? Up to 6-8 servings of whole grains each day. ? Less than 6 oz of lean meat, poultry, or fish each day. A 3-oz serving of meat is about the same size as a deck of cards. One egg equals 1 oz. ? 2 servings of low-fat dairy each day. ? A serving of nuts, seeds, or beans 5 times each week. ? Heart-healthy fats. Healthy fats called Omega-3 fatty acids are found in foods such as flaxseeds and coldwater fish, like sardines, salmon, and mackerel.  Limit how much you eat of the following: ? Canned or prepackaged foods. ? Food that is high in trans fat, such as fried foods. ? Food that is high in saturated fat, such as fatty meat. ? Sweets, desserts, sugary drinks, and other foods with added sugar. ? Full-fat dairy products.  Do not salt foods before eating.  Try to eat at least 2 vegetarian meals each week.  Eat more home-cooked food and less restaurant, buffet, and fast food.  When eating at a restaurant, ask that your food be prepared with less salt or no salt, if possible. What foods are recommended? The items listed may not be a complete list. Talk with your dietitian about   what dietary choices are best for you. Grains Whole-grain or whole-wheat bread. Whole-grain or whole-wheat pasta. Brown rice. Oatmeal. Quinoa. Bulgur. Whole-grain and low-sodium cereals. Pita bread. Low-fat, low-sodium crackers. Whole-wheat flour tortillas. Vegetables Fresh or frozen vegetables (raw, steamed, roasted, or grilled). Low-sodium or reduced-sodium tomato and vegetable juice. Low-sodium or reduced-sodium tomato sauce and tomato paste. Low-sodium or reduced-sodium canned vegetables. Fruits All fresh, dried, or frozen fruit. Canned fruit in natural juice (without  added sugar). Meat and other protein foods Skinless chicken or turkey. Ground chicken or turkey. Pork with fat trimmed off. Fish and seafood. Egg whites. Dried beans, peas, or lentils. Unsalted nuts, nut butters, and seeds. Unsalted canned beans. Lean cuts of beef with fat trimmed off. Low-sodium, lean deli meat. Dairy Low-fat (1%) or fat-free (skim) milk. Fat-free, low-fat, or reduced-fat cheeses. Nonfat, low-sodium ricotta or cottage cheese. Low-fat or nonfat yogurt. Low-fat, low-sodium cheese. Fats and oils Soft margarine without trans fats. Vegetable oil. Low-fat, reduced-fat, or light mayonnaise and salad dressings (reduced-sodium). Canola, safflower, olive, soybean, and sunflower oils. Avocado. Seasoning and other foods Herbs. Spices. Seasoning mixes without salt. Unsalted popcorn and pretzels. Fat-free sweets. What foods are not recommended? The items listed may not be a complete list. Talk with your dietitian about what dietary choices are best for you. Grains Baked goods made with fat, such as croissants, muffins, or some breads. Dry pasta or rice meal packs. Vegetables Creamed or fried vegetables. Vegetables in a cheese sauce. Regular canned vegetables (not low-sodium or reduced-sodium). Regular canned tomato sauce and paste (not low-sodium or reduced-sodium). Regular tomato and vegetable juice (not low-sodium or reduced-sodium). Pickles. Olives. Fruits Canned fruit in a light or heavy syrup. Fried fruit. Fruit in cream or butter sauce. Meat and other protein foods Fatty cuts of meat. Ribs. Fried meat. Bacon. Sausage. Bologna and other processed lunch meats. Salami. Fatback. Hotdogs. Bratwurst. Salted nuts and seeds. Canned beans with added salt. Canned or smoked fish. Whole eggs or egg yolks. Chicken or turkey with skin. Dairy Whole or 2% milk, cream, and half-and-half. Whole or full-fat cream cheese. Whole-fat or sweetened yogurt. Full-fat cheese. Nondairy creamers. Whipped toppings.  Processed cheese and cheese spreads. Fats and oils Butter. Stick margarine. Lard. Shortening. Ghee. Bacon fat. Tropical oils, such as coconut, palm kernel, or palm oil. Seasoning and other foods Salted popcorn and pretzels. Onion salt, garlic salt, seasoned salt, table salt, and sea salt. Worcestershire sauce. Tartar sauce. Barbecue sauce. Teriyaki sauce. Soy sauce, including reduced-sodium. Steak sauce. Canned and packaged gravies. Fish sauce. Oyster sauce. Cocktail sauce. Horseradish that you find on the shelf. Ketchup. Mustard. Meat flavorings and tenderizers. Bouillon cubes. Hot sauce and Tabasco sauce. Premade or packaged marinades. Premade or packaged taco seasonings. Relishes. Regular salad dressings. Where to find more information:  National Heart, Lung, and Blood Institute: www.nhlbi.nih.gov  American Heart Association: www.heart.org Summary  The DASH eating plan is a healthy eating plan that has been shown to reduce high blood pressure (hypertension). It may also reduce your risk for type 2 diabetes, heart disease, and stroke.  With the DASH eating plan, you should limit salt (sodium) intake to 2,300 mg a day. If you have hypertension, you may need to reduce your sodium intake to 1,500 mg a day.  When on the DASH eating plan, aim to eat more fresh fruits and vegetables, whole grains, lean proteins, low-fat dairy, and heart-healthy fats.  Work with your health care provider or diet and nutrition specialist (dietitian) to adjust your eating plan to your   individual calorie needs. This information is not intended to replace advice given to you by your health care provider. Make sure you discuss any questions you have with your health care provider. Document Revised: 07/23/2017 Document Reviewed: 08/03/2016 Elsevier Patient Education  2020 Elsevier Inc.  

## 2020-03-11 NOTE — Assessment & Plan Note (Signed)
A1c looks great today at 6.3.  Continue current regimen.  Continue to work on dietary choices and regular exercise.  Low up in 3 months.

## 2020-03-12 ENCOUNTER — Ambulatory Visit: Payer: Medicare Other | Admitting: Family Medicine

## 2020-03-18 ENCOUNTER — Other Ambulatory Visit: Payer: Self-pay | Admitting: Neurology

## 2020-03-18 ENCOUNTER — Other Ambulatory Visit: Payer: Self-pay | Admitting: Family Medicine

## 2020-03-18 MED ORDER — ACCU-CHEK GUIDE VI STRP: ORAL_STRIP | 99 refills | Status: DC

## 2020-03-18 NOTE — Telephone Encounter (Signed)
Pt called. She needs script for her strips to use  4 a day instead of 2 because she is on two different insulins.  Company has contacted [place] to approve a new meter but they haven't heard back from [place] as yet. She wants [place] to go ahead and approve the meter because her current meter has slowed down and her readings are low.

## 2020-03-18 NOTE — Telephone Encounter (Signed)
I haven't seen anything specific.  But we can definitely send over new prescription to test 4 times a day.

## 2020-03-19 MED ORDER — BLOOD GLUCOSE METER KIT: PACK | 0 refills | Status: DC

## 2020-03-19 NOTE — Telephone Encounter (Signed)
RX faxed to [facility].

## 2020-03-19 NOTE — Telephone Encounter (Signed)
Contacted the pt regarding meter and test strips. Per pt, mail order pharmacy is going to send her a new glucose meter. Requesting a rx to be sent to [facility]. Rx pended for approval.

## 2020-03-25 ENCOUNTER — Other Ambulatory Visit: Payer: Self-pay

## 2020-03-25 DIAGNOSIS — E1121 Type 2 diabetes mellitus with diabetic nephropathy: Secondary | ICD-10-CM

## 2020-03-25 DIAGNOSIS — Z794 Long term (current) use of insulin: Secondary | ICD-10-CM

## 2020-03-25 MED ORDER — ACCU-CHEK GUIDE VI STRP: ORAL_STRIP | 99 refills | Status: DC

## 2020-03-25 MED ORDER — ACCU-CHEK FASTCLIX LANCET KIT: PACK | 99 refills | Status: DC

## 2020-03-26 DIAGNOSIS — I10 Essential (primary) hypertension: Secondary | ICD-10-CM | POA: Diagnosis not present

## 2020-03-26 DIAGNOSIS — I5022 Chronic systolic (congestive) heart failure: Secondary | ICD-10-CM | POA: Diagnosis not present

## 2020-03-26 DIAGNOSIS — E118 Type 2 diabetes mellitus with unspecified complications: Secondary | ICD-10-CM | POA: Diagnosis not present

## 2020-03-26 LAB — LIPID PANEL
Cholesterol: 194 mg/dL (ref <200)
HDL: 67 mg/dL (ref >=50)
LDL Cholesterol (Calc): 99 mg/dL (calc)
Non-HDL Cholesterol (Calc): 127 mg/dL (calc) (ref <130)
Total CHOL/HDL Ratio: 2.9 (calc) (ref <5.0)
Triglycerides: 181 mg/dL — ABNORMAL HIGH (ref <150)

## 2020-03-26 LAB — COMPLETE METABOLIC PANEL WITH GFR
AG Ratio: 1.6 (calc) (ref 1.0–2.5)
ALT: 16 U/L (ref 6–29)
AST: 20 U/L (ref 10–35)
Albumin: 4.1 g/dL (ref 3.6–5.1)
Alkaline phosphatase (APISO): 85 U/L (ref 37–153)
BUN: 19 mg/dL (ref 7–25)
CO2: 27 mmol/L (ref 20–32)
Calcium: 9.9 mg/dL (ref 8.6–10.4)
Chloride: 105 mmol/L (ref 98–110)
Creat: 0.89 mg/dL (ref 0.60–0.93)
GFR, Est African American: 76 mL/min/{1.73_m2} (ref >=60)
GFR, Est Non African American: 66 mL/min/{1.73_m2} (ref >=60)
Globulin: 2.5 g/dL (calc) (ref 1.9–3.7)
Glucose, Bld: 123 mg/dL — ABNORMAL HIGH (ref 65–99)
Potassium: 4.8 mmol/L (ref 3.5–5.3)
Sodium: 140 mmol/L (ref 135–146)
Total Bilirubin: 0.4 mg/dL (ref 0.2–1.2)
Total Protein: 6.6 g/dL (ref 6.1–8.1)

## 2020-03-28 ENCOUNTER — Ambulatory Visit (INDEPENDENT_AMBULATORY_CARE_PROVIDER_SITE_OTHER): Payer: Medicare Other

## 2020-03-28 ENCOUNTER — Encounter: Payer: Self-pay | Admitting: Medical-Surgical

## 2020-03-28 ENCOUNTER — Other Ambulatory Visit: Payer: Self-pay

## 2020-03-28 ENCOUNTER — Ambulatory Visit (INDEPENDENT_AMBULATORY_CARE_PROVIDER_SITE_OTHER): Payer: Medicare Other | Admitting: Medical-Surgical

## 2020-03-28 VITALS — BP 117/58 | HR 76 | Temp 98.1°F | Ht 62.0 in | Wt 216.5 lb

## 2020-03-28 DIAGNOSIS — R0600 Dyspnea, unspecified: Secondary | ICD-10-CM

## 2020-03-28 DIAGNOSIS — R1084 Generalized abdominal pain: Secondary | ICD-10-CM | POA: Diagnosis not present

## 2020-03-28 DIAGNOSIS — R300 Dysuria: Secondary | ICD-10-CM

## 2020-03-28 DIAGNOSIS — I517 Cardiomegaly: Secondary | ICD-10-CM | POA: Diagnosis not present

## 2020-03-28 LAB — POCT URINALYSIS DIPSTICK
Bilirubin, UA: NEGATIVE
Blood, UA: NEGATIVE
Glucose, UA: NEGATIVE
Ketones, UA: NEGATIVE
Leukocytes, UA: NEGATIVE
Nitrite, UA: NEGATIVE
Odor: NEGATIVE
Protein, UA: NEGATIVE
Spec Grav, UA: 1.02 (ref 1.010–1.025)
Urobilinogen, UA: 0.2 E.U./dL
pH, UA: 5 (ref 5.0–8.0)

## 2020-03-28 MED ORDER — LANTUS SOLOSTAR 100 UNIT/ML ~~LOC~~ SOPN: PEN_INJECTOR | SUBCUTANEOUS | 3 refills | Status: DC

## 2020-03-28 MED ORDER — PREDNISONE 20 MG PO TABS: 20.0000 mg | ORAL_TABLET | Freq: Two times a day (BID) | ORAL | 0 refills | Status: DC

## 2020-03-28 NOTE — Patient Instructions (Signed)
Breathing: Take Prednisone 61m twice a day for 5 days  Bowels: Try taking Bentyl twice daily as needed for a couple of days to see if this will help reduce the stomach discomfort. This is likely a flare of IBS. If no improvement, contact your GI office for further instructions.  Sugars: on the prednisone, they will likely run high. Monitor closely and treat with insulin as prescribed.

## 2020-03-28 NOTE — Progress Notes (Signed)
Subjective:    CC: Dysuria, dyspnea, GI upset  HPI: Tanya Hogan 69 year old female presenting today with several concerns.  Dysuria-notes that her urine has been pretty clear lately but sometimes she has discomfort with urinating.  Reports that does not specifically burn or hurt but just describes it as uncomfortable.  No urinary odor or hematuria noted.  Dyspnea-has been using Spiriva and Advair as prescribed.  Using albuterol inhaler 1-2 times daily.  Notes that her breathing has been more difficult lately.  She does not feel short of breath or as if she cannot take a breath, she describes it as feeling as if her lungs quit working from the inside.  Mild cough productive of small amounts of white mucus.  Denies fevers, chills.  GI upset-has been very careful lately to avoid foods that exacerbate her IBS.  Notes that for the past 4 to 5 days that she has had increasing abdominal cramping, bloating, and few episodes of explosive diarrhea.  She does have Bentyl at home but does not take this as she reports it causes her to sleep too much.  Did not take Linzess on the days she had diarrhea but usually takes it every other day.  Stool is described as sticky and dark brown.  Has been experiencing fecal incontinence, especially when stool is very loose.  I reviewed the past medical history, family history, social history, surgical history, and allergies today and no changes were needed.  Please see the problem list section below in epic for further details.  Past Medical History: Past Medical History:  Diagnosis Date  . Alkaline phosphatase elevation   . Asthma   . Breast cancer ([place]) 2011  . Carpal tunnel syndrome   . Carpal tunnel syndrome    bilateral  . CHF (congestive heart failure) ([place])   . Cirrhosis, non-alcoholic ([place])   . Closed fracture of unspecified part of radius (alone)   . DDD (degenerative disc disease), lumbar   . Depression    pt denies  . Diabetes mellitus without  complication ([place])   . Enlarged heart   . Fibromyalgia   . GERD (gastroesophageal reflux disease)   . History of kidney stones   . HTN (hypertension)   . Hypothyroidism   . IBS (irritable bowel syndrome)   . LBBB (left bundle branch block)   . Microcytic anemia 05/14/2017  . Pneumonia   . Presence of permanent cardiac pacemaker   . PVD (peripheral vascular disease) ([place])    2019  . Sleep apnea    uses cpap   Past Surgical History: Past Surgical History:  Procedure Laterality Date  . BREAST LUMPECTOMY Left 2011  . COLONOSCOPY    . FOOT SURGERY    . KNEE SURGERY    . MASTECTOMY Right    05/2019  . PACEMAKER GENERATOR CHANGE N/A 08/23/2014   Procedure: PACEMAKER GENERATOR CHANGE;  Surgeon: Evans Lance, MD;  Location: [facility];  Service: Cardiovascular;  Laterality: N/A;  . PACEMAKER PLACEMENT  2009   [place] [facility]  . RECTAL SURGERY  1976  . REDUCTION MAMMAPLASTY Left    05/2019  . TOTAL SHOULDER ARTHROPLASTY Right 03/10/2018  . TOTAL SHOULDER ARTHROPLASTY Right 03/10/2018   Procedure: RIGHT TOTAL SHOULDER ARTHROPLASTY;  Surgeon: [name], MD;  Location: [facility];  Service: Orthopedics;  Laterality: Right;  . TUBAL LIGATION     Social History: Social History   Socioeconomic History  . Marital status: Divorced    Spouse name: Not on file  .  Number of children: 2  . Years of education: 44  . Highest education level: Associate degree: academic program  Occupational History  . 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 Smoking status: Former Smoker    Packs/day: 0.00    Years: 0.00    Pack years: 0.00    Quit date: 08/24/1966    Years since quitting: 53.6  . Smokeless tobacco: Never Used  Vaping Use  . Vaping Use: Never used  Substance and Sexual Activity  . Alcohol use: No  . Drug use: No  . Sexual activity: Not Currently  Other Topics Concern  . Not on file  Social History Narrative   Retired. 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 Likes Systems developer and music. Loves animals.   Social Determinants of Health   Financial Resource Strain:   . Difficulty of Paying Living Expenses:   Food Insecurity:   . Worried About Charity fundraiser in the Last Year:   . Arboriculturist in the Last Year:   Transportation Needs: No Transportation Needs  . Lack of Transportation (Medical): No  . Lack of Transportation (Non-Medical): No  Physical Activity:   . Days of Exercise per Week:   . Minutes of Exercise per Session:   Stress:   . Feeling of Stress :   Social Connections:   . Frequency of Communication with Friends and Family:   . Frequency of Social Gatherings with Friends and Family:   . Attends Religious Services:   . Active Member of Clubs or Organizations:   . Attends Archivist Meetings:   [name] Marital Status:    Family History: Family History  Problem Relation Age of Onset  . Melanoma Mother   . Parkinson's disease Mother   . Heart failure Other   . Breast cancer Other   . Hypertension Other    Allergies: Allergies  Allergen Reactions  . Penicillins Anaphylaxis    Has patient had a PCN reaction causing immediate rash, facial/tongue/throat swelling, SOB or lightheadedness with hypotension: Yes Has patient had a PCN reaction causing severe rash involving mucus membranes or skin necrosis: No Has patient had a PCN reaction that required hospitalization: No Has patient had a PCN reaction occurring within the last 10 years: No If all of the above answers are "NO", then may proceed with Cephalosporin use.   [name] Hcl] Other (See Comments)    Hyperkalemia  . Atorvastatin Other (See Comments)    msucle aches  . Celebrex [Celecoxib]     ulcers  . Fluticasone Other (See Comments)    Nasal ulcer.    . Levocetirizine     Doesn't recall  . Losartan Potassium Itching  . Naproxen Swelling  . Nasonex [Mometasone Furoate] Other (See Comments)     nosebleed  . Aspirin Other (See Comments)    Pt states "burns holes in stomach", ulcers   . Baking Soda-Fluoride [Sodium Fluoride] Itching  . Clindamycin/Lincomycin Rash  . Codeine Nausea And Vomiting  . Furosemide Other (See Comments)    Nose bleed  . Iodinated Diagnostic Agents Itching and Rash    Pt states she has had ultrasound with dye but was fine  . Lactose   . Lactose Intolerance (Gi) Other (See Comments)    Increased calcium, sneezing, nasal issues  . Lanolin-Petrolatum Rash  . Metformin And Related Diarrhea  . Miralax [Polyethylene Glycol 3350]   . Miralax [Polyethylene Glycol]  Rash  . Nasonex [Mometasone Furoate] Other (See Comments)  . Quinine Rash  . Rifampin Other (See Comments)    DOES NOT REMEMBER THIS MEDICATION OR ALLERGY  . Sulfa Antibiotics Rash  . Sulfonamide Derivatives Rash  . Tramadol Rash    Anxiety  . Wellbutrin [Bupropion] Other (See Comments)    Muscle aches, pain, "made my skin crawl"  . Wool Alcohol [Lanolin] Rash  . Zoledronic Acid Other (See Comments) and Rash    shaking   Medications: See med rec.  Review of Systems: See HPI for pertinent positives and negatives.   Objective:    General: Well Developed, well nourished, and in no acute distress.  Neuro: Alert and oriented x3.  HEENT: Normocephalic, atraumatic.  Skin: Warm and dry. Cardiac: Regular rate and rhythm, no murmurs rubs or gallops, no lower extremity edema.  Respiratory: Clear to auscultation bilaterally. Not using accessory muscles, speaking in full sentences.   Impression and Recommendations:    1. Dysuria POCT UA negative.  Encouraged patient to stay well-hydrated and practice good bladder hygiene. - POCT Urinalysis Dipstick  2. Dyspnea, unspecified type We will get a chest x-ray today.  Suspect this is an asthma exacerbation so we will treat with a burst dose of prednisone for 5 days.  Continue using Advair and Spiriva as prescribed.  Continue using albuterol inhaler as  needed. - DG Chest 2 View; Future - predniSONE (DELTASONE) 20 MG tablet; Take 1 tablet (20 mg total) by mouth 2 (two) times daily with a meal.  Dispense: 10 tablet; Refill: 0  3. Generalized abdominal pain This is likely a flare of her IBS although we do not have a known trigger.  Recommend trying to take Bentyl at least a couple times a day for 2 or 3 days to see if this settles her flare.  If this is not helpful, advised her to call her GI doctor's office for further recommendations.  Return if symptoms worsen or fail to improve. ___________________________________________ Clearnce Sorrel, DNP, APRN, FNP-BC [facility]

## 2020-04-01 ENCOUNTER — Ambulatory Visit (INDEPENDENT_AMBULATORY_CARE_PROVIDER_SITE_OTHER): Payer: Medicare Other | Admitting: *Deleted

## 2020-04-01 DIAGNOSIS — I442 Atrioventricular block, complete: Secondary | ICD-10-CM | POA: Diagnosis not present

## 2020-04-03 LAB — CUP PACEART REMOTE DEVICE CHECK
Battery Remaining Longevity: 25 mo
Battery Voltage: 2.93 V
Brady Statistic AP VP Percent: 37.35 %
Brady Statistic AP VS Percent: 0.15 %
Brady Statistic AS VP Percent: 60.99 %
Brady Statistic AS VS Percent: 1.51 %
Brady Statistic RA Percent Paced: 36.79 %
Brady Statistic RV Percent Paced: 97.01 %
Date Time Interrogation Session: 20210809054903
Implantable Lead Implant Date: 20030324
Implantable Lead Implant Date: 20030324
Implantable Lead Implant Date: 20030324
Implantable Lead Location: 753858
Implantable Lead Location: 753859
Implantable Lead Location: 753860
Implantable Lead Model: 4068
Implantable Lead Model: 4068
Implantable Lead Model: 4193
Implantable Pulse Generator Implant Date: 20151231
Lead Channel Impedance Value: 1159 Ohm
Lead Channel Impedance Value: 4047 Ohm
Lead Channel Impedance Value: 4047 Ohm
Lead Channel Impedance Value: 4047 Ohm
Lead Channel Impedance Value: 418 Ohm
Lead Channel Impedance Value: 418 Ohm
Lead Channel Impedance Value: 437 Ohm
Lead Channel Impedance Value: 570 Ohm
Lead Channel Impedance Value: 988 Ohm
Lead Channel Pacing Threshold Amplitude: 0.875 V
Lead Channel Pacing Threshold Amplitude: 1.625 V
Lead Channel Pacing Threshold Pulse Width: 0.4 ms
Lead Channel Pacing Threshold Pulse Width: 0.4 ms
Lead Channel Sensing Intrinsic Amplitude: 0.75 mV
Lead Channel Sensing Intrinsic Amplitude: 0.75 mV
Lead Channel Sensing Intrinsic Amplitude: 5.75 mV
Lead Channel Sensing Intrinsic Amplitude: 5.75 mV
Lead Channel Setting Pacing Amplitude: 1.5 V
Lead Channel Setting Pacing Amplitude: 2.5 V
Lead Channel Setting Pacing Amplitude: 3.5 V
Lead Channel Setting Pacing Pulse Width: 0.8 ms
Lead Channel Setting Pacing Pulse Width: 0.8 ms
Lead Channel Setting Sensing Sensitivity: 4 mV

## 2020-04-04 DIAGNOSIS — Z87891 Personal history of nicotine dependence: Secondary | ICD-10-CM | POA: Diagnosis not present

## 2020-04-04 DIAGNOSIS — Z9989 Dependence on other enabling machines and devices: Secondary | ICD-10-CM | POA: Diagnosis not present

## 2020-04-04 DIAGNOSIS — I5022 Chronic systolic (congestive) heart failure: Secondary | ICD-10-CM | POA: Diagnosis not present

## 2020-04-04 DIAGNOSIS — J4540 Moderate persistent asthma, uncomplicated: Secondary | ICD-10-CM | POA: Diagnosis not present

## 2020-04-04 DIAGNOSIS — G4733 Obstructive sleep apnea (adult) (pediatric): Secondary | ICD-10-CM | POA: Diagnosis not present

## 2020-04-04 DIAGNOSIS — I472 Ventricular tachycardia: Secondary | ICD-10-CM | POA: Diagnosis not present

## 2020-04-04 NOTE — Progress Notes (Signed)
Remote pacemaker transmission.   

## 2020-04-10 DIAGNOSIS — L84 Corns and callosities: Secondary | ICD-10-CM | POA: Diagnosis not present

## 2020-04-10 DIAGNOSIS — M79672 Pain in left foot: Secondary | ICD-10-CM | POA: Diagnosis not present

## 2020-04-10 DIAGNOSIS — M79671 Pain in right foot: Secondary | ICD-10-CM | POA: Diagnosis not present

## 2020-04-10 DIAGNOSIS — L602 Onychogryphosis: Secondary | ICD-10-CM | POA: Diagnosis not present

## 2020-04-10 DIAGNOSIS — E119 Type 2 diabetes mellitus without complications: Secondary | ICD-10-CM | POA: Diagnosis not present

## 2020-04-12 DIAGNOSIS — J4540 Moderate persistent asthma, uncomplicated: Secondary | ICD-10-CM | POA: Diagnosis not present

## 2020-04-13 DIAGNOSIS — J4540 Moderate persistent asthma, uncomplicated: Secondary | ICD-10-CM | POA: Diagnosis not present

## 2020-04-16 ENCOUNTER — Ambulatory Visit: Payer: Medicare Other

## 2020-04-22 ENCOUNTER — Encounter: Payer: Self-pay | Admitting: Physician Assistant

## 2020-04-22 ENCOUNTER — Telehealth (INDEPENDENT_AMBULATORY_CARE_PROVIDER_SITE_OTHER): Payer: Medicare Other | Admitting: Physician Assistant

## 2020-04-22 ENCOUNTER — Other Ambulatory Visit: Payer: Self-pay | Admitting: Family Medicine

## 2020-04-22 VITALS — BP 131/75 | HR 94 | Ht 62.0 in | Wt 216.0 lb

## 2020-04-22 DIAGNOSIS — G4733 Obstructive sleep apnea (adult) (pediatric): Secondary | ICD-10-CM | POA: Diagnosis not present

## 2020-04-22 DIAGNOSIS — J40 Bronchitis, not specified as acute or chronic: Secondary | ICD-10-CM | POA: Diagnosis not present

## 2020-04-22 DIAGNOSIS — J329 Chronic sinusitis, unspecified: Secondary | ICD-10-CM | POA: Diagnosis not present

## 2020-04-22 DIAGNOSIS — J4541 Moderate persistent asthma with (acute) exacerbation: Secondary | ICD-10-CM

## 2020-04-22 DIAGNOSIS — J0140 Acute pansinusitis, unspecified: Secondary | ICD-10-CM | POA: Diagnosis not present

## 2020-04-22 DIAGNOSIS — G4737 Central sleep apnea in conditions classified elsewhere: Secondary | ICD-10-CM | POA: Diagnosis not present

## 2020-04-22 MED ORDER — PREDNISONE 20 MG PO TABS: ORAL_TABLET | ORAL | 0 refills | Status: DC

## 2020-04-22 MED ORDER — DOXYCYCLINE HYCLATE 100 MG PO TABS: 100.0000 mg | ORAL_TABLET | Freq: Two times a day (BID) | ORAL | 0 refills | Status: DC

## 2020-04-22 NOTE — Progress Notes (Signed)
Congestion / asthma issues, coughing up phlegm (clear/snotty)  No fever, did try to switch to new inhaler but didn't work  [facility] pulmonologist a couple weeks ago [facility])  Does have a headache, has had one since she had a breathing test with pulmonology

## 2020-04-22 NOTE — Progress Notes (Signed)
Patient ID: Tanya Hogan, female   DOB: 1951/06/29, 69 y.o.   MRN: [id number] .Marland KitchenVirtual Visit via Telephone Note  I connected with [name] on 04/22/2020 at  2:20 PM EDT by telephone and verified that I am speaking with the correct person using two identifiers.  Location: Patient: home Provider: clinic   I discussed the limitations, risks, security and privacy concerns of performing an evaluation and management service by telephone and the availability of in person appointments. I also discussed with the patient that there may be a patient responsible charge related to this service. The patient expressed understanding and agreed to proceed.   History of Present Illness: Pt is a 69 yo obese female with CHF, paroxysmal VT, HTN, Asthma, OSA, T2DM who calls into the clinic with weeks of congestion, SOB, chest tightness, productive cough. She has been tested for covid many times and negative. The most recent was 1 month ago. Denies any fever, chills, body aches. She does have headache since visit with pulmonology and breathing test. Her inhalers have been switched but not started new inhaler yet. She is not sure of the name of new inhaler to replace advair. She thinks it could be [place] or [employer]. She watches her swelling and fluid level closes she has had to take a few extra bumex for swelling over past few weeks. She really feels like she has a sinus infection. Her head feels like it is going to explode. No other sick contacts in the hourse.   .. Active Ambulatory Problems    Diagnosis Date Noted  . Breast cancer of upper-inner quadrant of left female breast ([place]) 03/18/2010  . Hypothyroidism 10/03/2009  . Hyperlipidemia 11/07/2009  . Major depressive disorder 07/02/2009  . Benign essential hypertension 07/02/2009  . Complete heart block ([place]) s/p MDT BiVP 10/23/2009  . Chronic back pain 07/02/2009  . Fibromyalgia 10/29/2009  . ALKALINE PHOSPHATASE, ELEVATED 10/13/2009  . PACEMAKER,  PERMANENT 10/23/2009  . Chronic systolic heart failure ([place]) 05/23/2013  . Obesity, Class II, BMI 35-39.9, with comorbidity 06/09/2013  . Controlled diabetes mellitus type 2 with complications ([place]) 96/29/5284  . NASH (nonalcoholic steatohepatitis) 05/26/2015  . Bilateral foot pain 01/22/2016  . History of arthroplasty of right shoulder 02/24/2016  . Gastrointestinal food sensitivity 05/21/2016  . Primary osteoarthritis of left ankle 09/24/2016  . Asthma, moderate persistent 04/18/2013  . Microcytic anemia 05/14/2017  . Peripheral edema 05/19/2017  . Chronic cough 04/18/2013  . Delayed sleep phase syndrome 10/29/2016  . Hyposomnia 11/28/2013  . Nasal septal perforation 07/11/2014  . OSA (obstructive sleep apnea) 06/08/2013  . Physical deconditioning 04/24/2014  . Snoring 04/18/2013  . DDD (degenerative disc disease), cervical 11/19/2017  . Status post total shoulder arthroplasty, right 03/10/2018  . Bradycardia with less than 60 beats per minute 11/02/2017  . Arthralgia 04/11/2019  . Moderate persistent asthma with exacerbation 08/29/2019  . Chronic constipation 10/06/2019  . Malignant tumor of breast ([place]) 03/06/2019  . Macromastia 05/25/2019  . History of methicillin resistant Staphylococcus aureus infection 03/06/2019  . Genetic testing 08/14/2019  . Fatigue 03/06/2019  . Ductal carcinoma in situ (DCIS) of right breast 06/15/2019  . Paroxysmal VT ([place]) 02/14/2020   Resolved Ambulatory Problems    Diagnosis Date Noted  . Diabetes type 2, controlled ([place]) 04/26/2009  . Diabetes mellitus without complication ([place]) 13/24/4010  . Chronic systolic CHF (congestive heart failure) ([place]) 04/26/2009  . ALLERGIC RHINITIS CAUSE UNSPECIFIED 04/30/2009  . VAGINAL DISCHARGE 07/14/2010  . RHUS DERMATITIS 01/22/2010  .  FATIGUE 01/09/2010  . CLOSED FRACTURE OF UNSPECIFIED PART OF RADIUS 09/26/2009  . FALL, HX OF 09/26/2009  . Obesity 04/23/2011  . Cellulitis and abscess of face  05/29/2011  . STING 05/29/2011  . Lymphangitis 07/14/2012  . COPD with acute exacerbation ([place]) 04/03/2013  . Closed nondisplaced fracture of distal phalanx of left great toe 04/21/2013  . Rash 05/10/2014  . Jaw pain 05/10/2014  . Fatty liver disease, nonalcoholic 35/46/5681  . Chronic congestive heart failure ([place]) 05/19/2017  . Cellulitis and abscess 06/08/2013  . Shortness of breath 06/23/2016  . MRSA colonization 03/03/2018  . Gingivitis 09/01/2018  . Dizziness 11/02/2017  . Acute non-recurrent pansinusitis 08/29/2019  . Abdominal bloating 10/06/2019  . Asthma 03/06/2019   Past Medical History:  Diagnosis Date  . Alkaline phosphatase elevation   . Breast cancer ([place]) 2011  . Carpal tunnel syndrome   . Carpal tunnel syndrome   . CHF (congestive heart failure) ([place])   . Cirrhosis, non-alcoholic ([place])   . DDD (degenerative disc disease), lumbar   . Depression   . Enlarged heart   . GERD (gastroesophageal reflux disease)   . History of kidney stones   . HTN (hypertension)   . IBS (irritable bowel syndrome)   . LBBB (left bundle branch block)   . Pneumonia   . Presence of permanent cardiac pacemaker   . PVD (peripheral vascular disease) ([place])   . Sleep apnea    Reviewed med, allergy, problem list.    Observations/Objective: No video. Productive cough on audio. No labored breathing.   .. Today's Vitals   04/22/20 1154  BP: 131/75  Pulse: 94  Weight: 216 lb (98 kg)  Height: 5' 2"  (1.575 m)   Body mass index is 39.51 kg/m.     Assessment and Plan: .Marland KitchenBret was seen today for nasal congestion.  Diagnoses and all orders for this visit:  Sinobronchitis -     predniSONE (DELTASONE) 20 MG tablet; Take 3 tablets for 3 days, take 2 tablets for 3 days, take 1 tablets for 3 days, take 1/2 tablet for 4 days. -     doxycycline (VIBRA-TABS) 100 MG tablet; Take 1 tablet (100 mg total) by mouth 2 (two) times daily.  Moderate persistent asthma with exacerbation -      predniSONE (DELTASONE) 20 MG tablet; Take 3 tablets for 3 days, take 2 tablets for 3 days, take 1 tablets for 3 days, take 1/2 tablet for 4 days.  Acute non-recurrent pansinusitis -     predniSONE (DELTASONE) 20 MG tablet; Take 3 tablets for 3 days, take 2 tablets for 3 days, take 1 tablets for 3 days, take 1/2 tablet for 4 days. -     doxycycline (VIBRA-TABS) 100 MG tablet; Take 1 tablet (100 mg total) by mouth 2 (two) times daily.   Pt is high risk for complications if she gets covid. She is vaccinated. She has had these symptoms for weeks and been tested pretty frequently last being 1 month ago. I would expect more lung involvement if covid at this point. STrongly urged patient caution in being out in public right now. Start doxycycline and prednisone. Rest and hydrate. Watch for worsening heart failure with swelling. If breathing and lungs worsen follow up.     Follow Up Instructions:    I discussed the assessment and treatment plan with the patient. The patient was provided an opportunity to ask questions and all were answered. The patient agreed with the plan and demonstrated an understanding  of the instructions.   The patient was advised to call back or seek an in-person evaluation if the symptoms worsen or if the condition fails to improve as anticipated.  I provided 15 minutes of non-face-to-face time during this encounter discussing covid risk factors, treatment plan, reviewing previous visits and diagnosis.   Iran Planas, PA-C

## 2020-05-02 ENCOUNTER — Telehealth: Payer: Self-pay | Admitting: Internal Medicine

## 2020-05-02 ENCOUNTER — Ambulatory Visit (INDEPENDENT_AMBULATORY_CARE_PROVIDER_SITE_OTHER): Payer: Medicare Other | Admitting: Family Medicine

## 2020-05-02 ENCOUNTER — Other Ambulatory Visit: Payer: Self-pay

## 2020-05-02 VITALS — BP 114/72 | HR 82

## 2020-05-02 DIAGNOSIS — Z23 Encounter for immunization: Secondary | ICD-10-CM

## 2020-05-02 MED ORDER — DILTIAZEM HCL ER COATED BEADS 120 MG PO CP24
120.0000 mg | ORAL_CAPSULE | Freq: Every day | ORAL | 3 refills | Status: DC
Start: 2020-05-02 — End: 2020-06-19

## 2020-05-02 NOTE — Telephone Encounter (Signed)
Call returned to Pt.  Per Pt prior to her most recent hospitalization she was taking diltiazem and feels like she was doing better as far as breathing and swelling.  During her last hospitalization the diltiazem was stopped and she was changed to carvedilol 12.5 mg bid.  Pt would like to change back to diltiazem.  Spoke with Dr. Pietro Cassis Dr. Lovena Le that would be ok.  Advised Pt new prescription sent to pharmacy.  Advised Pt to call this nurse back if she had further issues on the new medication.

## 2020-05-02 NOTE — Progress Notes (Signed)
Patient is here for a flu vaccine. Verified no previous allergy to flu vaccine, eggs, or latex. Flu injection to left deltoid with no apparent complications. Patient advised to call with any problems.

## 2020-05-02 NOTE — Telephone Encounter (Signed)
Pt said that the dosage of carvedilol (COREG) 12.5 MG tablet is wrong. She said that she was told to take 3.75 MG twice a day. Saying that she is suffering from swelling and her asthma is really bad. Thinks that the carvedilol has something to do with it and do not think that she should be taking it because she is experiencing all the side effects that it causes. Please call to discuss. Call cell number at [phone]

## 2020-05-02 NOTE — Progress Notes (Signed)
Agree with documentation as above.   Maida Widger, MD  

## 2020-05-06 DIAGNOSIS — E119 Type 2 diabetes mellitus without complications: Secondary | ICD-10-CM | POA: Diagnosis not present

## 2020-05-06 LAB — HM DIABETES EYE EXAM

## 2020-05-08 ENCOUNTER — Other Ambulatory Visit: Payer: Self-pay | Admitting: *Deleted

## 2020-05-08 MED ORDER — ALENDRONATE SODIUM 70 MG PO TABS
70.0000 mg | ORAL_TABLET | ORAL | 4 refills | Status: DC
Start: 2020-05-08 — End: 2021-07-28

## 2020-05-10 DIAGNOSIS — M79662 Pain in left lower leg: Secondary | ICD-10-CM | POA: Diagnosis not present

## 2020-05-10 DIAGNOSIS — R2242 Localized swelling, mass and lump, left lower limb: Secondary | ICD-10-CM | POA: Diagnosis not present

## 2020-05-10 DIAGNOSIS — M7989 Other specified soft tissue disorders: Secondary | ICD-10-CM | POA: Diagnosis not present

## 2020-05-10 DIAGNOSIS — D0511 Intraductal carcinoma in situ of right breast: Secondary | ICD-10-CM | POA: Diagnosis not present

## 2020-05-14 ENCOUNTER — Other Ambulatory Visit: Payer: Self-pay

## 2020-05-14 DIAGNOSIS — E1121 Type 2 diabetes mellitus with diabetic nephropathy: Secondary | ICD-10-CM

## 2020-05-14 MED ORDER — ACCU-CHEK GUIDE VI STRP: ORAL_STRIP | 99 refills | Status: DC

## 2020-05-16 ENCOUNTER — Telehealth: Payer: Self-pay

## 2020-05-16 NOTE — Telephone Encounter (Signed)
Pt called stating that her Pulmonologist switch her to [facility] inhaler. Per pt, since using the inhaler her blood sugar has been elevated. Today's fasting blood glucose was 290. Pt mentioned that common side effects from the inhaler is abnormal potassium and glucose reading. Pt has had no luck in contacting her Pulmonologist. She wants to know what can she do to lower elevated blood sugar. Please advise, thanks.

## 2020-05-16 NOTE — Telephone Encounter (Signed)
Task completed. Pt has been updated of provider's recommendations. Pt was agreeable with plan. She will call the office on Monday with an update. No other inquiries during the call.

## 2020-05-16 NOTE — Telephone Encounter (Signed)
We can increase her Lantus dose for the short-term and tell they can figure something out with the inhaler.  She may have to consider switching back to Advair if she did better on that inhaler but again for the short-term she can increase her Lantus.  I believe she is currently doing 20 units daily.  So she can try increasing that to 25 units daily.  She can go up by 1 unit daily as needed depending on the elevated blood sugar numbers.  She is welcome to call back after the weekend and let us know what it is doing.  Also make sure avoiding sweets and carbs and drinking plenty of water.

## 2020-05-27 ENCOUNTER — Telehealth: Payer: Self-pay | Admitting: Internal Medicine

## 2020-05-27 NOTE — Telephone Encounter (Signed)
*  STAT* If patient is at the pharmacy, call can be transferred to refill team.   1. Which medications need to be refilled? (please list name of each medication and dose if known) amiodarone (PACERONE) 200 MG tablet  2. Which pharmacy/location (including street and city if local pharmacy) is medication to be sent to? [place], [address], [address]  3. Do they need a 30 day or 90 day supply? 90 day

## 2020-05-27 NOTE — Telephone Encounter (Signed)
Called pt to inform her that her medication was sent to [facility] mail order pharmacy in July 2021 with a year supply. I also called [facility] mail order pharmacy and they had the medication on hold and they are getting pt's medication ready to be shipped out. Pharmacist verbalized confirmation.

## 2020-05-29 DIAGNOSIS — J45909 Unspecified asthma, uncomplicated: Secondary | ICD-10-CM | POA: Diagnosis not present

## 2020-06-05 DIAGNOSIS — I5033 Acute on chronic diastolic (congestive) heart failure: Secondary | ICD-10-CM | POA: Diagnosis not present

## 2020-06-05 DIAGNOSIS — R0602 Shortness of breath: Secondary | ICD-10-CM | POA: Diagnosis not present

## 2020-06-11 ENCOUNTER — Ambulatory Visit (INDEPENDENT_AMBULATORY_CARE_PROVIDER_SITE_OTHER): Payer: Medicare Other | Admitting: Family Medicine

## 2020-06-11 ENCOUNTER — Encounter: Payer: Self-pay | Admitting: Family Medicine

## 2020-06-11 VITALS — BP 137/96 | HR 90 | Ht 62.0 in | Wt 219.0 lb

## 2020-06-11 DIAGNOSIS — R5383 Other fatigue: Secondary | ICD-10-CM

## 2020-06-11 DIAGNOSIS — D1724 Benign lipomatous neoplasm of skin and subcutaneous tissue of left leg: Secondary | ICD-10-CM

## 2020-06-11 DIAGNOSIS — G4733 Obstructive sleep apnea (adult) (pediatric): Secondary | ICD-10-CM

## 2020-06-11 DIAGNOSIS — I10 Essential (primary) hypertension: Secondary | ICD-10-CM

## 2020-06-11 DIAGNOSIS — E118 Type 2 diabetes mellitus with unspecified complications: Secondary | ICD-10-CM | POA: Diagnosis not present

## 2020-06-11 DIAGNOSIS — I700 Atherosclerosis of aorta: Secondary | ICD-10-CM | POA: Diagnosis not present

## 2020-06-11 LAB — POCT GLYCOSYLATED HEMOGLOBIN (HGB A1C): Hemoglobin A1C: 7.1 % — AB (ref 4.0–5.6)

## 2020-06-11 NOTE — Assessment & Plan Note (Signed)
Still really struggling with fatigue overall she is not sure why they have checked her BiPAP and it seems to be working well.  She actually has a CT scan of her lung scheduled for Monday hopefully that will give some additional information she is been working very closely with her pulmonologist on this.  Her A1c is elevated to 7.1 today so it is up a little bit again she was on prednisone for several weeks and noticed a bump with the change in her inhaler.

## 2020-06-11 NOTE — Assessment & Plan Note (Addendum)
Uncontrolled.  Hemoglobin A1c jumped up to 7.1 from previous of 6.3.  Most likely secondary to steroids and change in her inhaler.  We discussed increasing the Lantus by 1 unit daily until fastings are under 130.  She did not realize that she could go past 25 units.  I think it was just a misunderstanding when she had called into the office.  Continue with sliding scale as needed.  Follow back up in 3 months.

## 2020-06-11 NOTE — Assessment & Plan Note (Signed)
Currently on pravastatin and tolerating well.

## 2020-06-11 NOTE — Assessment & Plan Note (Signed)
Ports she has been wearing her BiPAP very consistently she says even if she lays down and takes a nap during the day which she does almost daily she wears her BiPAP.

## 2020-06-11 NOTE — Progress Notes (Signed)
Established Patient Office Visit  Subjective:  Patient ID: Tanya Hogan, female    DOB: 1951/06/07  Age: 69 y.o. MRN: [id number]  CC:  Chief Complaint  Patient presents with  . Diabetes    HPI Tanya Hogan presents for   Hypertension- Pt denies chest pain, SOB, dizziness, or heart palpitations.  Taking meds as directed w/o problems.  Denies medication side effects.    Diabetes - no hypoglycemic events. No wounds or sores that are not healing well. No increased thirst or urination. Checking glucose at home. Taking medications as prescribed without any side effects.  Dr. Donzetta Kohut her pulmonologist has her scheduled for a CT scan of her chest on Monday.  She is still having some difficulty with shortness of breath and sputum production in her upper chest area but feels like it is a little better.  She been on 2 rounds of prednisone which increased her sugar up to the 400s.  Her Advair was also increased to 500 and that caused a significant jump in her blood sugars to the 200s.  She is now back on the Advair 250 and doing a little bit better.  She did go up on her Lantus to 25 units but never went higher than that.  She also wanted to let me know about a lump on her left lower leg just below her knee.  She spoke to her oncologist about it.  They did do a scan just to make sure that it was not any type of abnormal tumor.  Past Medical History:  Diagnosis Date  . Alkaline phosphatase elevation   . Asthma   . Breast cancer ([place]) 2011  . Carpal tunnel syndrome   . Carpal tunnel syndrome    bilateral  . CHF (congestive heart failure) ([place])   . Cirrhosis, non-alcoholic ([place])   . Closed fracture of unspecified part of radius (alone)   . DDD (degenerative disc disease), lumbar   . Depression    pt denies  . Diabetes mellitus without complication ([place])   . Enlarged heart   . Fibromyalgia   . GERD (gastroesophageal reflux disease)   . History of kidney stones   . HTN (hypertension)    . Hypothyroidism   . IBS (irritable bowel syndrome)   . LBBB (left bundle branch block)   . Microcytic anemia 05/14/2017  . Pneumonia   . Presence of permanent cardiac pacemaker   . PVD (peripheral vascular disease) ([place])    2019  . Sleep apnea    uses cpap    Past Surgical History:  Procedure Laterality Date  . BREAST LUMPECTOMY Left 2011  . COLONOSCOPY    . FOOT SURGERY    . KNEE SURGERY    . MASTECTOMY Right    05/2019  . PACEMAKER GENERATOR CHANGE N/A 08/23/2014   Procedure: PACEMAKER GENERATOR CHANGE;  Surgeon: Evans Lance, MD;  Location: [facility] C[facility] LAB;  Service: Cardiovascular;  Laterality: N/A;  . PACEMAKER PLACEMENT  2009   [place] [facility]  . RECTAL SURGERY  1976  . REDUCTION MAMMAPLASTY Left    05/2019  . TOTAL SHOULDER ARTHROPLASTY Right 03/10/2018  . TOTAL SHOULDER ARTHROPLASTY Right 03/10/2018   Procedure: RIGHT TOTAL SHOULDER ARTHROPLASTY;  Surgeon: [name], MD;  Location: [place];  Service: Orthopedics;  Laterality: Right;  . TUBAL LIGATION      Family History  Problem Relation Age of Onset  . Melanoma Mother   . Parkinson's disease Mother   . Heart failure  Other   . Breast cancer Other   . Hypertension Other     Social History   Socioeconomic History  . Marital status: Divorced    Spouse name: Not on file  . Number of children: 2  . Years of education: 90  . Highest education level: Associate degree: academic program  Occupational History  . 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 Smoking status: Former Smoker    Packs/day: 0.00    Years: 0.00    Pack years: 0.00    Quit date: 08/24/1966    Years since quitting: 53.8  . Smokeless tobacco: Never Used  Vaping Use  . Vaping Use: Never used  Substance and Sexual Activity  . Alcohol use: No  . Drug use: No  . Sexual activity: Not Currently  Other Topics Concern  . Not on file  Social History Narrative   Retired. Cooks breakfast,  helps with her brother. Likes Systems developer and music. Loves animals.   Social Determinants of Health   Financial Resource Strain:   . Difficulty of Paying Living Expenses: Not on file  Food Insecurity:   . Worried About Charity fundraiser in the Last Year: Not on file  . Ran Out of Food in the Last Year: Not on file  Transportation Needs: No Transportation Needs  . Lack of Transportation (Medical): No  . Lack of Transportation (Non-Medical): No  Physical Activity:   . Days of Exercise per Week: Not on file  . Minutes of Exercise per Session: Not on file  Stress:   . Feeling of Stress : Not on file  Social Connections:   . Frequency of Communication with Friends and Family: Not on file  . Frequency of Social Gatherings with Friends and Family: Not on file  . Attends Religious Services: Not on file  . Active Member of Clubs or Organizations: Not on file  . Attends Archivist Meetings: Not on file  . Marital Status: Not on file  Intimate Partner Violence: Unknown  . Fear of Current or Ex-Partner: No  . Emotionally Abused: Not on file  . Physically Abused: Not on file  . Sexually Abused: Not on file    Outpatient Medications Prior to Visit  Medication Sig Dispense Refill  . Accu-Chek FastClix Lancets MISC Apply topically.    [name] alendronate (FOSAMAX) 70 MG tablet Take 1 tablet (70 mg total) by mouth every 7 (seven) days. Take with a full glass of water on an empty stomach. 12 tablet 4  . AMBULATORY NON FORMULARY MEDICATION Medication Name: Tubing and supplies for nebulizer. Fax - [phone] 1 vial 0  . amiodarone (PACERONE) 200 MG tablet Take 0.5 tablets (100 mg total) by mouth daily. 45 tablet 3  . blood glucose meter kit and supplies KIT Dispense based on patient and insurance preference. 1 each 0  . bumetanide (BUMEX) 1 MG tablet TAKE 1 TABLET BY MOUTH  TWICE DAILY 180 tablet 3  . cetirizine (ZYRTEC) 10 MG tablet Take 10 mg by mouth daily.    [name] dicyclomine  (BENTYL) 20 MG tablet Take by mouth.    . diltiazem (CARDIZEM CD) 120 MG 24 hr capsule Take 1 capsule (120 mg total) by mouth daily. 90 capsule 3  . EPIPEN 2-PAK 0.3 MG/0.3ML SOAJ injection INJECT 1 PEN INTO MUSCLE OF LATERAL THIGH FOR SEVERE ALLERGIC REACTION. CALL 911 AFTER USING THIS MEDICATION. 1 each 2  . Fluticasone-Salmeterol (ADVAIR) 250-50  MCG/DOSE AEPB Inhale 1 puff into the lungs in the morning and at bedtime. Inhale 1 puff into the lungs in the morning and at bedtime. Wash mouth out after using.    [name] glucose blood (ACCU-CHEK GUIDE) test strip Dx DM E11.21 - Check fasting blood sugar every morning before breakfast and before lunch and dinner. 3 times daily 900 each prn  . insulin glargine (LANTUS SOLOSTAR) 100 UNIT/ML Solostar Pen INJECT SUBCUTANEOUSLY 20UNITS AT BEDTIME 30 mL 3  . insulin lispro (HUMALOG KWIKPEN) 100 UNIT/ML KwikPen Inject 0.03-0.12 mLs (3-12 Units total) into the skin 3 (three) times daily as needed (hyperglycemia>150). Sliding scale. Administer subQ within 15 minutes before or immediately after a meal 9 mL 2  . Insulin Pen Needle 31G X 8 MM MISC To be used for injecting insulin Dx: E11.8 300 each 4  . Lancets Misc. (ACCU-CHEK FASTCLIX LANCET) KIT E11.21 Dx DM Check fasting blood sugar every morning before breakfast and before lunch and dinner. 3 times daily 300 kit prn  . levothyroxine (SYNTHROID) 75 MCG tablet TAKE 1 TABLET BY MOUTH  DAILY 90 tablet 3  . LINZESS 145 MCG CAPS capsule Take 145 mcg by mouth daily.    . montelukast (SINGULAIR) 10 MG tablet Take 1 tablet by mouth at bedtime.    . Multiple Vitamins-Minerals (OCUVITE EYE + MULTI) TABS Take 1 tablet by mouth daily.    . Olopatadine HCl 0.2 % SOLN Place 1 drop into both eyes daily.    . pantoprazole (PROTONIX) 40 MG tablet Take 40 mg by mouth daily.    . pravastatin (PRAVACHOL) 40 MG tablet TAKE 1 TABLET BY MOUTH AT  BEDTIME 90 tablet 3  . PROAIR HFA 108 (90 Base) MCG/ACT inhaler USE 1 TO 2 INHALATIONS BY   MOUTH INTO THE LUNGS EVERY  6 HOURS AS NEEDED FOR  WHEEZING OR SHORTNESS OF  BREATH 17 g 6  . RESTASIS 0.05 % ophthalmic emulsion     . tiotropium (SPIRIVA HANDIHALER) 18 MCG inhalation capsule INHALE THE CONTENTS OF 1  CAPSULE BY MOUTH VIA  HANDIHALER DAILY 90 capsule 2  . valACYclovir (VALTREX) 1000 MG tablet TAKE 1 TABLET BY MOUTH TWICE DAILY 14 tablet 2  . ADVAIR DISKUS 250-50 MCG/DOSE AEPB USE 1 INHALATION BY MOUTH  TWICE DAILY 180 each 3  . blood glucose meter kit and supplies Dispense based on patient and insurance preference. Use up to four times daily as directed. (FOR ICD-10 E10.9, E11.9). 1 each 0  . Blood Glucose Monitoring Suppl (ACCU-CHEK GUIDE) w/Device KIT     . doxycycline (VIBRA-TABS) 100 MG tablet Take 1 tablet (100 mg total) by mouth 2 (two) times daily. 20 tablet 0  . predniSONE (DELTASONE) 20 MG tablet Take 3 tablets for 3 days, take 2 tablets for 3 days, take 1 tablets for 3 days, take 1/2 tablet for 4 days. 20 tablet 0   No facility-administered medications prior to visit.    Allergies  Allergen Reactions  . Penicillins Anaphylaxis    Has patient had a PCN reaction causing immediate rash, facial/tongue/throat swelling, SOB or lightheadedness with hypotension: Yes Has patient had a PCN reaction causing severe rash involving mucus membranes or skin necrosis: No Has patient had a PCN reaction that required hospitalization: No Has patient had a PCN reaction occurring within the last 10 years: No If all of the above answers are "NO", then may proceed with Cephalosporin use.   [name] Hcl] Other (See Comments)    Hyperkalemia  .  Atorvastatin Other (See Comments)    msucle aches  . Celebrex [Celecoxib]     ulcers  . Fluticasone Other (See Comments)    Nasal ulcer.    . Levocetirizine     Doesn't recall  . Losartan Potassium Itching  . Naproxen Swelling  . Nasonex [Mometasone Furoate] Other (See Comments)    nosebleed  . Aspirin Other (See Comments)     Pt states "burns holes in stomach", ulcers   . Baking Soda-Fluoride [Sodium Fluoride] Itching  . Clindamycin/Lincomycin Rash  . Codeine Nausea And Vomiting  . Furosemide Other (See Comments)    Nose bleed  . Iodinated Diagnostic Agents Itching and Rash    Pt states she has had ultrasound with dye but was fine  . Lactose   . Lactose Intolerance (Gi) Other (See Comments)    Increased calcium, sneezing, nasal issues  . Lanolin-Petrolatum Rash  . Metformin And Related Diarrhea  . Miralax [Polyethylene Glycol 3350]   . Miralax [Polyethylene Glycol] Rash  . Nasonex [Mometasone Furoate] Other (See Comments)  . Quinine Rash  . Rifampin Other (See Comments)    DOES NOT REMEMBER THIS MEDICATION OR ALLERGY  . Sulfa Antibiotics Rash  . Sulfonamide Derivatives Rash  . Tramadol Rash    Anxiety  . Wellbutrin [Bupropion] Other (See Comments)    Muscle aches, pain, "made my skin crawl"  . Wool Alcohol [Lanolin] Rash  . Zoledronic Acid Other (See Comments) and Rash    shaking    ROS Review of Systems    Objective:    Physical Exam Constitutional:      Appearance: She is well-developed.  HENT:     Head: Normocephalic and atraumatic.  Cardiovascular:     Rate and Rhythm: Normal rate and regular rhythm.     Heart sounds: Normal heart sounds.  Pulmonary:     Effort: Pulmonary effort is normal.     Breath sounds: Normal breath sounds.  Skin:    General: Skin is warm and dry.  Neurological:     Mental Status: She is alert and oriented to person, place, and time.  Psychiatric:        Behavior: Behavior normal.     BP (!) 137/96   Pulse 90   Ht _0  (1.575 m)   Wt 219 lb (99.3 kg)   SpO2 99%   BMI 40.06 kg/m  Wt Readings from Last 3 Encounters:  06/11/20 219 lb (99.3 kg)  04/22/20 216 lb (98 kg)  03/28/20 216 lb 8 oz (98.2 kg)     Health Maintenance Due  Topic Date Due  . OPHTHALMOLOGY EXAM  04/08/2019    There are no preventive care reminders to display for this  patient.  Lab Results  Component Value Date   TSH 1.27 01/09/2020   Lab Results  Component Value Date   WBC 6.5 03/14/2019   HGB 14.1 03/14/2019   HCT 42.7 03/14/2019   MCV 92.0 03/14/2019   PLT 287 03/14/2019   Lab Results  Component Value Date   NA 140 03/26/2020   K 4.8 03/26/2020   CHLORIDE 105 10/30/2014   CO2 27 03/26/2020   GLUCOSE 123 (H) 03/26/2020   BUN 19 03/26/2020   CREATININE 0.89 03/26/2020   BILITOT 0.4 03/26/2020   ALKPHOS 100 03/02/2018   AST 20 03/26/2020   ALT 16 03/26/2020   PROT 6.6 03/26/2020   ALBUMIN 4.0 03/02/2018   CALCIUM 9.9 03/26/2020   ANIONGAP 8 03/11/2018   EGFR 78 (  L) 10/30/2014   Lab Results  Component Value Date   CHOL 194 03/26/2020   Lab Results  Component Value Date   HDL 67 03/26/2020   Lab Results  Component Value Date   LDLCALC 99 03/26/2020   Lab Results  Component Value Date   TRIG 181 (H) 03/26/2020   Lab Results  Component Value Date   CHOLHDL 2.9 03/26/2020   Lab Results  Component Value Date   HGBA1C 7.1 (A) 06/11/2020      Assessment & Plan:   Problem List Items Addressed This Visit      Cardiovascular and Mediastinum   Benign essential hypertension - Primary    Diastolic pressure elevated today.  F/U in 2 weeks for nurse visit check.        Aortic atherosclerosis (HCC)    Currently on pravastatin and tolerating well.        Respiratory   OSA (obstructive sleep apnea)    Ports she has been wearing her BiPAP very consistently she says even if she lays down and takes a nap during the day which she does almost daily she wears her BiPAP.        Endocrine   Controlled diabetes mellitus type 2 with complications (HCC)    Uncontrolled.  Hemoglobin A1c jumped up to 7.1 from previous of 6.3.  Most likely secondary to steroids and change in her inhaler.  We discussed increasing the Lantus by 1 unit daily until fastings are under 130.  She did not realize that she could go past 25 units.  I think it  was just a misunderstanding when she had called into the office.  Continue with sliding scale as needed.  Follow back up in 3 months.      Relevant Orders   POCT glycosylated hemoglobin (Hb A1C) (Completed)     Other   Fatigue    Still really struggling with fatigue overall she is not sure why they have checked her BiPAP and it seems to be working well.  She actually has a CT scan of her lung scheduled for Monday hopefully that will give some additional information she is been working very closely with her pulmonologist on this.  Her A1c is elevated to 7.1 today so it is up a little bit again she was on prednisone for several weeks and noticed a bump with the change in her inhaler.      Benign lipomatous neoplasm of skin and subcutaneous tissue of left leg      Mass on her left leg just below the knee is most consistent with a lipoma it is very smooth round and firm but not hard.  She had a negative work-up.  No orders of the defined types were placed in this encounter.   Follow-up: Return in about 3 months (around 09/11/2020) for Diabetes follow-up.    Beatrice Lecher, MD

## 2020-06-11 NOTE — Assessment & Plan Note (Signed)
Diastolic pressure elevated today.  F/U in 2 weeks for nurse visit check.

## 2020-06-11 NOTE — Patient Instructions (Addendum)
F/u in 2 weeks for bp check with nurse  Increase your Lantus by 1 unit each day until your fasting blood sugars in the morning are under 130.  Once you get there than just stay at that dose.  If all of a sudden you are getting blood sugars under 100 then you can go backwards by 1 unit if needed.  You to work on eating low carb and low sugar diet.  Try to get lots of vegetables and lean proteins in.

## 2020-06-12 ENCOUNTER — Telehealth: Payer: Self-pay

## 2020-06-12 NOTE — Telephone Encounter (Signed)
Tanya Hogan called to say she stopped the Pravastatin awhile back. She states it is still on her medication list. She did not have a reason as to why she stopped taking the medication.

## 2020-06-13 NOTE — Telephone Encounter (Signed)
Patient advised, she is OK to restart. She is restarting QHS.

## 2020-06-13 NOTE — Telephone Encounter (Signed)
Because she has diabetes,  She really needs to be on a statin to reduce her risk for heart attach and stroke. She also has known atherosclerosis, so another reason to be on a statin.  Would she be willing to restart it . Taken daily at bedtime?

## 2020-06-17 DIAGNOSIS — R0602 Shortness of breath: Secondary | ICD-10-CM | POA: Diagnosis not present

## 2020-06-17 DIAGNOSIS — I2699 Other pulmonary embolism without acute cor pulmonale: Secondary | ICD-10-CM | POA: Diagnosis not present

## 2020-06-17 DIAGNOSIS — I517 Cardiomegaly: Secondary | ICD-10-CM | POA: Diagnosis not present

## 2020-06-17 DIAGNOSIS — J9811 Atelectasis: Secondary | ICD-10-CM | POA: Diagnosis not present

## 2020-06-17 DIAGNOSIS — K760 Fatty (change of) liver, not elsewhere classified: Secondary | ICD-10-CM | POA: Diagnosis not present

## 2020-06-18 ENCOUNTER — Telehealth: Payer: Self-pay

## 2020-06-18 ENCOUNTER — Telehealth: Payer: Self-pay | Admitting: Internal Medicine

## 2020-06-18 NOTE — Telephone Encounter (Signed)
I went back and looked at historical records around that time that she had a renal ultrasound I do not see any notes about any type of reaction.  The contraindication was added by a nurse to her chart.  Some not sure if maybe she had initially reported some itching afterwards and so they added it but they did mark it is a low intolerance.  I think it would be reasonable to remove that from her allergy list.

## 2020-06-18 NOTE — Telephone Encounter (Signed)
   Pt c/o BP issue: STAT if pt c/o blurred vision, one-sided weakness or slurred speech  1. What are your last 5 BP readings? 137/96 HR 90 160/99 HR 111 143/90 HR 84   2. Are you having any other symptoms (ex. Dizziness, headache, blurred vision, passed out)? Fatigue and SOB  3. What is your BP issue? Pt said her BP is fluctuating especially after her dye ct scan yesterday. She wanted to check in with Dr. Lovena Le if she needs to change her medications

## 2020-06-18 NOTE — Telephone Encounter (Signed)
Patient called requesting that the allergy to Iodinated Diagnostic Agents be removed from her chart. She states she does not ever recall having any kind of reaction to this before and it was added in her chart in 2016. She states she had to do the prednisone and benadryl pre-med yesterday before some imaging and she felt very sick and shaky due to it.  She wants to know if she can do some kind of allergy testing to verify she is not in fact allergic and have it removed from her chart so that she ever has to do the benadryl and prednisone pre med again

## 2020-06-19 ENCOUNTER — Other Ambulatory Visit: Payer: Self-pay | Admitting: Family Medicine

## 2020-06-19 MED ORDER — DILTIAZEM HCL ER COATED BEADS 180 MG PO CP24: 180.0000 mg | ORAL_CAPSULE | Freq: Every day | ORAL | 0 refills | Status: DC

## 2020-06-19 NOTE — Telephone Encounter (Signed)
Outreach made to Pt.  At last office visit Pt was on carvedilol 12.5 mg bid-but she subsequently called back and wanted to change from carvedilol to diltiazem.  Pt now has increased blood pressure.  Pt has recently seen her PCP and is scheduled for a nurse visit to follow blood pressure.  Advised Pt would send this note to her PCP for her to start a new blood pressure medication if needed.  Pt in agreement.

## 2020-06-19 NOTE — Telephone Encounter (Signed)
Call pt: Sent over slightly higher dose of the diltiazem for her to try between now and her appointment next week to recheck her blood pressure.

## 2020-06-21 NOTE — Telephone Encounter (Signed)
Patient advised, starting new medication today.

## 2020-06-21 NOTE — Telephone Encounter (Signed)
Patient advised, allergy removed from chart.

## 2020-06-24 ENCOUNTER — Telehealth: Payer: Self-pay

## 2020-06-24 NOTE — Telephone Encounter (Signed)
Ok, that is fine.

## 2020-06-24 NOTE — Telephone Encounter (Signed)
Tanya Hogan called and states the Singulair is drying her out. She states she is going to stop taking the medication.

## 2020-06-24 NOTE — Telephone Encounter (Signed)
Patient advised.

## 2020-06-25 ENCOUNTER — Ambulatory Visit (INDEPENDENT_AMBULATORY_CARE_PROVIDER_SITE_OTHER): Payer: Medicare Other | Admitting: Family Medicine

## 2020-06-25 ENCOUNTER — Other Ambulatory Visit: Payer: Self-pay

## 2020-06-25 VITALS — BP 137/50 | HR 84

## 2020-06-25 DIAGNOSIS — I10 Essential (primary) hypertension: Secondary | ICD-10-CM | POA: Diagnosis not present

## 2020-06-25 NOTE — Progress Notes (Signed)
Pt's diastolic is down today.  137/50

## 2020-06-25 NOTE — Progress Notes (Signed)
Hypertension-it looks like patient is doing really well with recent change in blood pressure medication continue current regimen.  Beatrice Lecher, MD

## 2020-07-01 ENCOUNTER — Ambulatory Visit (INDEPENDENT_AMBULATORY_CARE_PROVIDER_SITE_OTHER): Payer: Medicare Other

## 2020-07-01 DIAGNOSIS — I442 Atrioventricular block, complete: Secondary | ICD-10-CM

## 2020-07-01 LAB — CUP PACEART REMOTE DEVICE CHECK
Battery Remaining Longevity: 23 mo
Battery Voltage: 2.92 V
Brady Statistic AP VP Percent: 17.74 %
Brady Statistic AP VS Percent: 0.03 %
Brady Statistic AS VP Percent: 80.92 %
Brady Statistic AS VS Percent: 1.31 %
Brady Statistic RA Percent Paced: 17.46 %
Brady Statistic RV Percent Paced: 97.36 %
Date Time Interrogation Session: 20211108063734
Implantable Lead Implant Date: 20030324
Implantable Lead Implant Date: 20030324
Implantable Lead Implant Date: 20030324
Implantable Lead Location: 753858
Implantable Lead Location: 753859
Implantable Lead Location: 753860
Implantable Lead Model: 4068
Implantable Lead Model: 4068
Implantable Lead Model: 4193
Implantable Pulse Generator Implant Date: 20151231
Lead Channel Impedance Value: 1045 Ohm
Lead Channel Impedance Value: 4047 Ohm
Lead Channel Impedance Value: 4047 Ohm
Lead Channel Impedance Value: 4047 Ohm
Lead Channel Impedance Value: 418 Ohm
Lead Channel Impedance Value: 418 Ohm
Lead Channel Impedance Value: 437 Ohm
Lead Channel Impedance Value: 551 Ohm
Lead Channel Impedance Value: 855 Ohm
Lead Channel Pacing Threshold Amplitude: 0.875 V
Lead Channel Pacing Threshold Amplitude: 2.25 V
Lead Channel Pacing Threshold Pulse Width: 0.4 ms
Lead Channel Pacing Threshold Pulse Width: 0.4 ms
Lead Channel Sensing Intrinsic Amplitude: 1.625 mV
Lead Channel Sensing Intrinsic Amplitude: 1.625 mV
Lead Channel Sensing Intrinsic Amplitude: 11.5 mV
Lead Channel Sensing Intrinsic Amplitude: 11.5 mV
Lead Channel Setting Pacing Amplitude: 1.5 V
Lead Channel Setting Pacing Amplitude: 2.5 V
Lead Channel Setting Pacing Amplitude: 3.5 V
Lead Channel Setting Pacing Pulse Width: 0.8 ms
Lead Channel Setting Pacing Pulse Width: 0.8 ms
Lead Channel Setting Sensing Sensitivity: 4 mV

## 2020-07-01 NOTE — Progress Notes (Signed)
Remote pacemaker transmission.   

## 2020-07-09 ENCOUNTER — Telehealth: Payer: Self-pay | Admitting: Cardiovascular Disease

## 2020-07-09 ENCOUNTER — Telehealth: Payer: Self-pay | Admitting: Internal Medicine

## 2020-07-09 ENCOUNTER — Telehealth: Payer: Self-pay

## 2020-07-09 NOTE — Telephone Encounter (Signed)
Patient is calling to let Dr. Lovena Le know that she wants him to see what the results of the scans she took at [facility]. Request letterhead to fax number [phone]. Cat scan on heart on June 17, 2020

## 2020-07-09 NOTE — Telephone Encounter (Signed)
Tanya Hogan called and wanted Dr Madilyn Fireman to see the results of CTA with [facility]. It was done on 06/17/2020.  IMPRESSION:  1. Negative for pulmonary embolism, pneumonia or other acute  cardiopulmonary process.  2. Low inspiratory volumes with mild bibasilar atelectasis.  3. Cardiomegaly with left ventricular dilation.  4. Biventricular cardiac rhythm maintenance device.  5. Hepatic steatosis.

## 2020-07-10 NOTE — Telephone Encounter (Signed)
Results available for review in [place].

## 2020-07-10 NOTE — Telephone Encounter (Signed)
Okay, thanks for letting me know.  It looks like there is no worrisome findings.  CT confirms her fatty liver.  A little atelectasis at the base of the lungs is not unusual.  Just work on staying active and taking deep breath exercises throughout the day.

## 2020-07-11 ENCOUNTER — Telehealth: Payer: Self-pay | Admitting: *Deleted

## 2020-07-11 MED ORDER — DILTIAZEM HCL ER COATED BEADS 180 MG PO CP24: 180.0000 mg | ORAL_CAPSULE | Freq: Every day | ORAL | 1 refills | Status: DC

## 2020-07-11 NOTE — Telephone Encounter (Signed)
   Primary Cardiologist: Dr Lovena Le  Chart reviewed as part of pre-operative protocol coverage.   Simple dental extractions are considered low risk procedures per guidelines and generally do not require any specific cardiac clearance. It is also generally accepted that for simple extractions and dental cleanings, there is no need to interrupt blood thinner therapy.   SBE prophylaxis is not required for the patient from a cardiac standpoint.  I will route this recommendation to the requesting party via Epic fax function and remove from pre-op pool.  Please call with questions.  Kerin Ransom, PA-C 07/11/2020, 4:32 PM

## 2020-07-11 NOTE — Telephone Encounter (Signed)
   [facility] Pre-operative Risk Assessment    HEARTCARE STAFF: - Please ensure there is not already an duplicate clearance open for this procedure. - Under Visit Info/Reason for Call, type in Other and utilize the format Clearance MM/DD/YY or Clearance TBD. Do not use dashes or single digits. - If request is for dental extraction, please clarify the # of teeth to be extracted.  Request for surgical clearance:  1. What type of surgery is being performed? 1 TOOTH TO BE EXTRACTED   2. When is this surgery scheduled? TBD   3. What type of clearance is required (medical clearance vs. Pharmacy clearance to hold med vs. Both)? MEDICAL  4. Are there any medications that need to be held prior to surgery and how long? NONE LISTED   5. Practice name and name of physician performing surgery? [facility]; DR. Joneen Caraway   6. What is the office phone number? [phone]   7.   What is the office fax number? [phone]  8.   Anesthesia type (None, local, MAC, general) ? LOCAL   Julaine Hua 07/11/2020, 4:12 PM  _________________________________________________________________   (provider comments below)

## 2020-07-11 NOTE — Telephone Encounter (Signed)
Patient advised.

## 2020-07-15 ENCOUNTER — Ambulatory Visit (INDEPENDENT_AMBULATORY_CARE_PROVIDER_SITE_OTHER): Payer: Medicare Other | Admitting: Sports Medicine

## 2020-07-15 DIAGNOSIS — M5030 Other cervical disc degeneration, unspecified cervical region: Secondary | ICD-10-CM | POA: Diagnosis not present

## 2020-07-15 DIAGNOSIS — F324 Major depressive disorder, single episode, in partial remission: Secondary | ICD-10-CM | POA: Diagnosis not present

## 2020-07-15 NOTE — Assessment & Plan Note (Signed)
Tearful in the exam room, she knows that she is depressed and anxious, she is however resistant to most of the effective forms of treatment. She will discuss this with her PCP.

## 2020-07-15 NOTE — Assessment & Plan Note (Signed)
Morbid obesity, she is getting set up for bariatric surgery.

## 2020-07-15 NOTE — Progress Notes (Signed)
    Procedures performed today:    None.  Independent interpretation of notes and tests performed by another provider:   None.  Brief History, Exam, Impression, and Recommendations:    DDD (degenerative disc disease), cervical Tanya Hogan returns, she is a 69 year old female with spondylosis at C5-C6 on CT scan, she is unable to have MRIs due to her pacemaker. She did have a cervical epidural, initially she told me she did not have relief but now she saw me she had fantastic relief, I would like to repeat. Ordering repeat cervical epidural [place] [facility]. She does have uncontrolled depression and is resistant to taking antidepressants. Distal make it very difficult to fully control her pain symptoms.  Major depressive disorder Tearful in the exam room, she knows that she is depressed and anxious, she is however resistant to most of the effective forms of treatment. She will discuss this with her PCP.  Obesity, Class II, BMI 35-39.9, with comorbidity Morbid obesity, she is getting set up for bariatric surgery.    ___________________________________________ [name]. [name], M.D., ABFM., CAQSM. Primary Care and [place] Instructor of [place] [employer] [facility] [employer]

## 2020-07-15 NOTE — Assessment & Plan Note (Signed)
Tanya Hogan returns, she is a 69 year old female with spondylosis at C5-C6 on CT scan, she is unable to have MRIs due to her pacemaker. She did have a cervical epidural, initially she told me she did not have relief but now she saw me she had fantastic relief, I would like to repeat. Ordering repeat cervical epidural [place] [facility]. She does have uncontrolled depression and is resistant to taking antidepressants. Distal make it very difficult to fully control her pain symptoms.

## 2020-07-16 ENCOUNTER — Other Ambulatory Visit: Payer: Self-pay | Admitting: Family Medicine

## 2020-07-17 DIAGNOSIS — I10 Essential (primary) hypertension: Secondary | ICD-10-CM | POA: Diagnosis not present

## 2020-07-17 DIAGNOSIS — Z794 Long term (current) use of insulin: Secondary | ICD-10-CM | POA: Diagnosis not present

## 2020-07-17 DIAGNOSIS — E1100 Type 2 diabetes mellitus with hyperosmolarity without nonketotic hyperglycemic-hyperosmolar coma (NKHHC): Secondary | ICD-10-CM | POA: Diagnosis not present

## 2020-07-22 ENCOUNTER — Telehealth: Payer: Self-pay

## 2020-07-22 NOTE — Telephone Encounter (Signed)
Yes that is correct.

## 2020-07-22 NOTE — Telephone Encounter (Signed)
Tanya Hogan called and states she hasn't been called for the epidural injection. I see the order so I did call [name]. I hope this was correct. I didn't see a referral for Dr Dorothey Baseman.

## 2020-07-23 DIAGNOSIS — E119 Type 2 diabetes mellitus without complications: Secondary | ICD-10-CM | POA: Diagnosis not present

## 2020-07-23 DIAGNOSIS — G4737 Central sleep apnea in conditions classified elsewhere: Secondary | ICD-10-CM | POA: Diagnosis not present

## 2020-07-23 DIAGNOSIS — M79672 Pain in left foot: Secondary | ICD-10-CM | POA: Diagnosis not present

## 2020-07-23 DIAGNOSIS — L602 Onychogryphosis: Secondary | ICD-10-CM | POA: Diagnosis not present

## 2020-07-23 DIAGNOSIS — G4733 Obstructive sleep apnea (adult) (pediatric): Secondary | ICD-10-CM | POA: Diagnosis not present

## 2020-07-23 DIAGNOSIS — L84 Corns and callosities: Secondary | ICD-10-CM | POA: Diagnosis not present

## 2020-07-23 DIAGNOSIS — M79671 Pain in right foot: Secondary | ICD-10-CM | POA: Diagnosis not present

## 2020-07-26 ENCOUNTER — Other Ambulatory Visit: Payer: Self-pay | Admitting: Family Medicine

## 2020-08-06 ENCOUNTER — Ambulatory Visit
Admission: RE | Admit: 2020-08-06 | Discharge: 2020-08-06 | Disposition: A | Discharge status: Home / Self Care | Payer: Medicare Other | Source: Ambulatory Visit | Attending: Sports Medicine | Admitting: Sports Medicine

## 2020-08-06 ENCOUNTER — Other Ambulatory Visit: Payer: Self-pay

## 2020-08-06 DIAGNOSIS — M47812 Spondylosis without myelopathy or radiculopathy, cervical region: Secondary | ICD-10-CM | POA: Diagnosis not present

## 2020-08-06 DIAGNOSIS — M5030 Other cervical disc degeneration, unspecified cervical region: Secondary | ICD-10-CM

## 2020-08-06 MED ORDER — IOPAMIDOL (ISOVUE-M 300) INJECTION 61%
1.0000 mL | Freq: Once | INTRAMUSCULAR | Status: AC
  Administered 2020-08-06: 1 mL via EPIDURAL

## 2020-08-06 MED ORDER — TRIAMCINOLONE ACETONIDE 40 MG/ML IJ SUSP (RADIOLOGY)
60.0000 mg | Freq: Once | INTRAMUSCULAR | Status: AC
  Administered 2020-08-06: 60 mg via EPIDURAL

## 2020-08-06 NOTE — Discharge Instructions (Signed)

## 2020-08-08 DIAGNOSIS — I5022 Chronic systolic (congestive) heart failure: Secondary | ICD-10-CM | POA: Diagnosis not present

## 2020-08-08 DIAGNOSIS — I10 Essential (primary) hypertension: Secondary | ICD-10-CM | POA: Diagnosis not present

## 2020-08-08 DIAGNOSIS — E118 Type 2 diabetes mellitus with unspecified complications: Secondary | ICD-10-CM | POA: Diagnosis not present

## 2020-08-08 DIAGNOSIS — Z794 Long term (current) use of insulin: Secondary | ICD-10-CM | POA: Diagnosis not present

## 2020-08-08 DIAGNOSIS — Z713 Dietary counseling and surveillance: Secondary | ICD-10-CM | POA: Diagnosis not present

## 2020-08-14 ENCOUNTER — Other Ambulatory Visit: Payer: Self-pay | Admitting: Family Medicine

## 2020-08-14 DIAGNOSIS — T50905A Adverse effect of unspecified drugs, medicaments and biological substances, initial encounter: Secondary | ICD-10-CM

## 2020-08-15 ENCOUNTER — Other Ambulatory Visit: Payer: Self-pay

## 2020-08-15 DIAGNOSIS — R739 Hyperglycemia, unspecified: Secondary | ICD-10-CM

## 2020-08-15 MED ORDER — INSULIN LISPRO (1 UNIT DIAL) 100 UNIT/ML (KWIKPEN): 3.0000 [IU] | PEN_INJECTOR | Freq: Three times a day (TID) | SUBCUTANEOUS | 2 refills | Status: DC | PRN

## 2020-08-19 ENCOUNTER — Other Ambulatory Visit: Payer: Self-pay

## 2020-08-19 ENCOUNTER — Ambulatory Visit (INDEPENDENT_AMBULATORY_CARE_PROVIDER_SITE_OTHER): Payer: Medicare Other | Admitting: Sports Medicine

## 2020-08-19 DIAGNOSIS — M5030 Other cervical disc degeneration, unspecified cervical region: Secondary | ICD-10-CM

## 2020-08-19 DIAGNOSIS — F324 Major depressive disorder, single episode, in partial remission: Secondary | ICD-10-CM
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
## 2020-08-27 ENCOUNTER — Ambulatory Visit: Payer: Medicare Other | Admitting: Physical Therapy

## 2020-08-29 DIAGNOSIS — K7581 Nonalcoholic steatohepatitis (NASH): Secondary | ICD-10-CM | POA: Diagnosis not present

## 2020-08-29 DIAGNOSIS — K7400 Hepatic fibrosis, unspecified: Secondary | ICD-10-CM | POA: Diagnosis not present

## 2020-08-29 DIAGNOSIS — R140 Abdominal distension (gaseous): Secondary | ICD-10-CM | POA: Diagnosis not present

## 2020-08-29 DIAGNOSIS — K5909 Other constipation: Secondary | ICD-10-CM | POA: Diagnosis not present

## 2020-08-29 DIAGNOSIS — I10 Essential (primary) hypertension: Secondary | ICD-10-CM | POA: Diagnosis not present

## 2020-08-29 DIAGNOSIS — K219 Gastro-esophageal reflux disease without esophagitis: Secondary | ICD-10-CM | POA: Diagnosis not present

## 2020-08-29 DIAGNOSIS — Z86010 Personal history of colonic polyps: Secondary | ICD-10-CM | POA: Diagnosis not present

## 2020-08-29 DIAGNOSIS — K581 Irritable bowel syndrome with constipation: Secondary | ICD-10-CM | POA: Diagnosis not present

## 2020-08-30 DIAGNOSIS — I10 Essential (primary) hypertension: Secondary | ICD-10-CM | POA: Diagnosis not present

## 2020-08-30 DIAGNOSIS — K7581 Nonalcoholic steatohepatitis (NASH): Secondary | ICD-10-CM | POA: Diagnosis not present

## 2020-09-04 ENCOUNTER — Other Ambulatory Visit: Payer: Self-pay | Admitting: Family Medicine

## 2020-09-04 DIAGNOSIS — K0510 Chronic gingivitis, plaque induced: Secondary | ICD-10-CM

## 2020-09-12 ENCOUNTER — Encounter: Payer: Medicare Other | Admitting: Internal Medicine

## 2020-09-16 ENCOUNTER — Other Ambulatory Visit: Payer: Self-pay

## 2020-09-16 ENCOUNTER — Encounter: Payer: Self-pay | Admitting: Family Medicine

## 2020-09-16 ENCOUNTER — Ambulatory Visit (INDEPENDENT_AMBULATORY_CARE_PROVIDER_SITE_OTHER): Payer: Medicare Other | Admitting: Family Medicine

## 2020-09-16 VITALS — BP 136/77 | HR 85 | Ht 62.0 in | Wt 197.0 lb

## 2020-09-16 DIAGNOSIS — Z170 Estrogen receptor positive status [ER+]: Secondary | ICD-10-CM | POA: Diagnosis not present

## 2020-09-16 DIAGNOSIS — C50212 Malignant neoplasm of upper-inner quadrant of left female breast: Secondary | ICD-10-CM | POA: Diagnosis not present

## 2020-09-16 DIAGNOSIS — K581 Irritable bowel syndrome with constipation: Secondary | ICD-10-CM | POA: Insufficient documentation

## 2020-09-16 DIAGNOSIS — Z950 Presence of cardiac pacemaker: Secondary | ICD-10-CM | POA: Diagnosis not present

## 2020-09-16 DIAGNOSIS — R32 Unspecified urinary incontinence: Secondary | ICD-10-CM

## 2020-09-16 DIAGNOSIS — N898 Other specified noninflammatory disorders of vagina: Secondary | ICD-10-CM

## 2020-09-16 DIAGNOSIS — N3281 Overactive bladder: Secondary | ICD-10-CM

## 2020-09-16 DIAGNOSIS — E118 Type 2 diabetes mellitus with unspecified complications: Secondary | ICD-10-CM

## 2020-09-16 LAB — POCT URINALYSIS DIP (CLINITEK)
Bilirubin, UA: NEGATIVE
Blood, UA: NEGATIVE
Glucose, UA: NEGATIVE mg/dL
Ketones, POC UA: NEGATIVE mg/dL
Leukocytes, UA: NEGATIVE
Nitrite, UA: NEGATIVE
POC PROTEIN,UA: NEGATIVE
Spec Grav, UA: 1.02 (ref 1.010–1.025)
Urobilinogen, UA: 0.2 E.U./dL
pH, UA: 5.5 (ref 5.0–8.0)

## 2020-09-16 LAB — POCT GLYCOSYLATED HEMOGLOBIN (HGB A1C): Hemoglobin A1C: 6 % — AB (ref 4.0–5.6)

## 2020-09-16 LAB — POCT UA - MICROALBUMIN
Albumin/Creatinine Ratio, Urine, POC: <30
Creatinine, POC: 100 mg/dL
Microalbumin Ur, POC: 30 mg/L

## 2020-09-16 MED ORDER — DARIFENACIN HYDROBROMIDE ER 7.5 MG PO TB24: 7.5000 mg | ORAL_TABLET | Freq: Every day | ORAL | 1 refills | Status: DC

## 2020-09-16 NOTE — Assessment & Plan Note (Signed)
Lab Results  Component Value Date   HGBA1C 6.0 (A) 09/16/2020   1 see looks phenomenal today she has brought it down from 7.1-6.0.  Just encouraged her to keep up the great work she is doing fantastic no significant hypoglycemic episodes.  Fastings have mostly been in the 120s which is great.  Getting her blood sugars under control will also help with her fatty liver.

## 2020-09-16 NOTE — Progress Notes (Signed)
Established Patient Office Visit  Subjective:  Patient ID: Tanya Hogan, female    DOB: 1951-05-19  Age: 70 y.o. MRN: [id number]  CC:  Chief Complaint  Patient presents with  . Diabetes  . Hypertension    HPI Tanya Hogan presents for   Diabetes - no hypoglycemic events. No wounds or sores that are not healing well. No increased thirst or urination. Checking glucose at home. Taking medications as prescribed without any side effects.  She also had some questions about her Linzess she wondered if she could actually go up on the dose.  She has a history of chronic constipation she does follow with digestive health I believe they are the ones prescribing the medication.  They did check her liver and they did not see any sign of fibrosis which is great.  She does still have fatty liver.  Should continue with healthy diet and regular exercise. She has IBS.   She also reports some irritation in the groin area.  Little discomfort when she urinates and also just feels a little irritated in general she says that has been going on for about 3 months ever since she has been noticing some worsening incontinence.  She said she had a bladder tack done about 10 years ago and it worked well up until recently she wonders if she would benefit from being on a bladder control medication.    Also had some questions about tamoxifen since she cannot get a mammogram right now she would like to take the tamoxifen she had initially declined it with the oncologist.  Her follow-up appointment is not until March.  Strongly encouraged her to call them and see if they would be willing to start the medication and then follow-up in March  Past Medical History:  Diagnosis Date  . Alkaline phosphatase elevation   . Asthma   . Breast cancer ([place]) 2011  . Carpal tunnel syndrome   . Carpal tunnel syndrome    bilateral  . CHF (congestive heart failure) ([place])   . Cirrhosis, non-alcoholic ([place])   . Closed fracture of  unspecified part of radius (alone)   . DDD (degenerative disc disease), lumbar   . Depression    pt denies  . Diabetes mellitus without complication ([place])   . Enlarged heart   . Fibromyalgia   . GERD (gastroesophageal reflux disease)   . History of kidney stones   . HTN (hypertension)   . Hypothyroidism   . IBS (irritable bowel syndrome)   . LBBB (left bundle branch block)   . Microcytic anemia 05/14/2017  . Pneumonia   . Presence of permanent cardiac pacemaker   . PVD (peripheral vascular disease) ([place])    2019  . Sleep apnea    uses cpap    Past Surgical History:  Procedure Laterality Date  . BREAST LUMPECTOMY Left 2011  . COLONOSCOPY    . FOOT SURGERY    . KNEE SURGERY    . MASTECTOMY Right    05/2019  . PACEMAKER GENERATOR CHANGE N/A 08/23/2014   Procedure: PACEMAKER GENERATOR CHANGE;  Surgeon: Evans Lance, MD;  Location: [facility] CATH LAB;  Service: Cardiovascular;  Laterality: N/A;  . PACEMAKER PLACEMENT  2009   [facility]  . RECTAL SURGERY  1976  . REDUCTION MAMMAPLASTY Left    05/2019  . TOTAL SHOULDER ARTHROPLASTY Right 03/10/2018  . TOTAL SHOULDER ARTHROPLASTY Right 03/10/2018   Procedure: RIGHT TOTAL SHOULDER ARTHROPLASTY;  Surgeon: [name], MD;  Location:  [facility];  Service: Orthopedics;  Laterality: Right;  . TUBAL LIGATION      Family History  Problem Relation Age of Onset  . Melanoma Mother   . Parkinson's disease Mother   . Heart failure Other   . Breast cancer Other   . Hypertension Other     Social History   Socioeconomic History  . Marital status: Divorced    Spouse name: Not on file  . Number of children: 2  . Years of education: 67  . Highest education level: Associate degree: academic program  Occupational History  . 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 Smoking status: Former Smoker    Packs/day: 0.00    Years: 0.00    Pack years: 0.00    Quit date: 08/24/1966    Years  since quitting: 54.1  . Smokeless tobacco: Never Used  Vaping Use  . Vaping Use: Never used  Substance and Sexual Activity  . Alcohol use: No  . Drug use: No  . Sexual activity: Not Currently  Other Topics Concern  . Not on file  Social History Narrative   Retired. 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 Likes Systems developer and music. Loves animals.   Social Determinants of Health   Financial Resource Strain: Not on file  Food Insecurity: Not on file  Transportation Needs: No Transportation Needs  . Lack of Transportation (Medical): No  . Lack of Transportation (Non-Medical): No  Physical Activity: Not on file  Stress: Not on file  Social Connections: Not on file  Intimate Partner Violence: Unknown  . Fear of Current or Ex-Partner: No  . Emotionally Abused: Not on file  . Physically Abused: Not on file  . Sexually Abused: Not on file    Outpatient Medications Prior to Visit  Medication Sig Dispense Refill  . Accu-Chek FastClix Lancets MISC Apply topically.    [name] alendronate (FOSAMAX) 70 MG tablet Take 1 tablet (70 mg total) by mouth every 7 (seven) days. Take with a full glass of water on an empty stomach. 12 tablet 4  . AMBULATORY NON FORMULARY MEDICATION Medication Name: Tubing and supplies for nebulizer. Fax - [phone] 1 vial 0  . amiodarone (PACERONE) 200 MG tablet Take 0.5 tablets (100 mg total) by mouth daily. 45 tablet 3  . blood glucose meter kit and supplies KIT Dispense based on patient and insurance preference. 1 each 0  . bumetanide (BUMEX) 1 MG tablet TAKE 1 TABLET BY MOUTH  TWICE DAILY 180 tablet 3  . cetirizine (ZYRTEC) 10 MG tablet Take 10 mg by mouth daily.    [name] dicyclomine (BENTYL) 20 MG tablet Take by mouth.    . diltiazem (CARDIZEM CD) 180 MG 24 hr capsule Take 1 capsule (180 mg total) by mouth daily. 90 capsule 1  . EPIPEN 2-PAK 0.3 MG/0.3ML SOAJ injection INJECT 1 PEN INTO MUSCLE OF LATERAL THIGH FOR SEVERE ALLERGIC REACTION. CALL 911 AFTER USING  THIS MEDICATION. 1 each 2  . Fluticasone-Salmeterol (ADVAIR) 250-50 MCG/DOSE AEPB Inhale 1 puff into the lungs in the morning and at bedtime. Inhale 1 puff into the lungs in the morning and at bedtime. Wash mouth out after using.    [name] glucose blood (ACCU-CHEK GUIDE) test strip Dx DM E11.21 - Check fasting blood sugar every morning before breakfast and before lunch and dinner. 3 times daily 900 each prn  . insulin glargine (LANTUS SOLOSTAR) 100 UNIT/ML Solostar Pen INJECT SUBCUTANEOUSLY 20UNITS AT  BEDTIME (Patient taking differently: Inject 45 Units into the skin at bedtime. INJECT SUBCUTANEOUSLY 20UNITS AT BEDTIME) 30 mL 3  . insulin lispro (HUMALOG KWIKPEN) 100 UNIT/ML KwikPen Inject 3-12 Units into the skin 3 (three) times daily as needed (hyperglycemia>150). Sliding scale. Administer subQ within 15 minutes before or immediately after a meal 9 mL 2  . Insulin Pen Needle 31G X 8 MM MISC To be used for injecting insulin Dx: E11.8 300 each 4  . Lancets Misc. (ACCU-CHEK FASTCLIX LANCET) KIT E11.21 Dx DM Check fasting blood sugar every morning before breakfast and before lunch and dinner. 3 times daily 300 kit prn  . levothyroxine (SYNTHROID) 75 MCG tablet TAKE 1 TABLET BY MOUTH  DAILY 90 tablet 3  . LINZESS 145 MCG CAPS capsule Take 145 mcg by mouth daily.    . montelukast (SINGULAIR) 10 MG tablet Take 1 tablet by mouth at bedtime.    . Multiple Vitamins-Minerals (OCUVITE EYE + MULTI) TABS Take 1 tablet by mouth daily.    . Olopatadine HCl 0.2 % SOLN Place 1 drop into both eyes daily.    . pantoprazole (PROTONIX) 40 MG tablet Take 40 mg by mouth daily.    . pravastatin (PRAVACHOL) 40 MG tablet TAKE 1 TABLET BY MOUTH AT  BEDTIME 90 tablet 3  . PROAIR HFA 108 (90 Base) MCG/ACT inhaler USE 1 TO 2 INHALATIONS BY  MOUTH INTO THE LUNGS EVERY  6 HOURS AS NEEDED FOR  WHEEZING OR SHORTNESS OF  BREATH 17 g 6  . RESTASIS 0.05 % ophthalmic emulsion     . tiotropium (SPIRIVA HANDIHALER) 18 MCG inhalation capsule  INHALE THE CONTENTS OF 1  CAPSULE BY MOUTH VIA  HANDIHALER DAILY 90 capsule 2  . valACYclovir (VALTREX) 1000 MG tablet TAKE 1 TABLET BY MOUTH TWICE DAILY 14 tablet 2   No facility-administered medications prior to visit.    Allergies  Allergen Reactions  . Penicillins Anaphylaxis    Has patient had a PCN reaction causing immediate rash, facial/tongue/throat swelling, SOB or lightheadedness with hypotension: Yes Has patient had a PCN reaction causing severe rash involving mucus membranes or skin necrosis: No Has patient had a PCN reaction that required hospitalization: No Has patient had a PCN reaction occurring within the last 10 years: No If all of the above answers are "NO", then may proceed with Cephalosporin use.   [name] Hcl] Other (See Comments)    Hyperkalemia  . Atorvastatin Other (See Comments)    msucle aches  . Celebrex [Celecoxib]     ulcers  . Fluticasone Other (See Comments)    Nasal ulcer.    . Levocetirizine     Doesn't recall  . Losartan Potassium Itching  . Naproxen Swelling  . Nasonex [Mometasone Furoate] Other (See Comments)    nosebleed  . Aspirin Other (See Comments)    Pt states "burns holes in stomach", ulcers   . Baking Soda-Fluoride [Sodium Fluoride] Itching  . Clindamycin/Lincomycin Rash  . Codeine Nausea And Vomiting  . Furosemide Other (See Comments)    Nose bleed  . Lactose   . Lactose Intolerance (Gi) Other (See Comments)    Increased calcium, sneezing, nasal issues  . Lanolin-Petrolatum Rash  . Metformin And Related Diarrhea  . Miralax [Polyethylene Glycol 3350]   . Miralax [Polyethylene Glycol] Rash  . Nasonex [Mometasone Furoate] Other (See Comments)  . Quinine Rash  . Rifampin Other (See Comments)    DOES NOT REMEMBER THIS MEDICATION OR ALLERGY  . Sulfa  Antibiotics Rash  . Sulfonamide Derivatives Rash  . Tramadol Rash    Anxiety  . Wellbutrin [Bupropion] Other (See Comments)    Muscle aches, pain, "made my skin  crawl"  . Wool Alcohol [Lanolin] Rash  . Zoledronic Acid Other (See Comments) and Rash    shaking    ROS Review of Systems    Objective:    Physical Exam Vitals reviewed.  Constitutional:      Appearance: She is well-developed and well-nourished.  HENT:     Head: Normocephalic and atraumatic.  Eyes:     Extraocular Movements: EOM normal.     Conjunctiva/sclera: Conjunctivae normal.  Cardiovascular:     Rate and Rhythm: Normal rate.  Pulmonary:     Effort: Pulmonary effort is normal.  Skin:    General: Skin is dry.     Coloration: Skin is not pale.  Neurological:     Mental Status: She is alert and oriented to person, place, and time.  Psychiatric:        Mood and Affect: Mood and affect normal.        Behavior: Behavior normal.     BP 136/77   Pulse 85   Ht _0  (1.575 m)   Wt 197 lb (89.4 kg)   SpO2 98%   BMI 36.03 kg/m  Wt Readings from Last 3 Encounters:  09/16/20 197 lb (89.4 kg)  06/11/20 219 lb (99.3 kg)  04/22/20 216 lb (98 kg)     Health Maintenance Due  Topic Date Due  . OPHTHALMOLOGY EXAM  04/08/2019  . COVID-19 Vaccine (3 - Moderna risk 4-dose series) 12/27/2019    There are no preventive care reminders to display for this patient.  Lab Results  Component Value Date   TSH 1.27 01/09/2020   Lab Results  Component Value Date   WBC 6.5 03/14/2019   HGB 14.1 03/14/2019   HCT 42.7 03/14/2019   MCV 92.0 03/14/2019   PLT 287 03/14/2019   Lab Results  Component Value Date   NA 140 03/26/2020   K 4.8 03/26/2020   CHLORIDE 105 10/30/2014   CO2 27 03/26/2020   GLUCOSE 123 (H) 03/26/2020   BUN 19 03/26/2020   CREATININE 0.89 03/26/2020   BILITOT 0.4 03/26/2020   ALKPHOS 100 03/02/2018   AST 20 03/26/2020   ALT 16 03/26/2020   PROT 6.6 03/26/2020   ALBUMIN 4.0 03/02/2018   CALCIUM 9.9 03/26/2020   ANIONGAP 8 03/11/2018   EGFR 78 (L) 10/30/2014   Lab Results  Component Value Date   CHOL 194 03/26/2020   Lab Results  Component  Value Date   HDL 67 03/26/2020   Lab Results  Component Value Date   LDLCALC 99 03/26/2020   Lab Results  Component Value Date   TRIG 181 (H) 03/26/2020   Lab Results  Component Value Date   CHOLHDL 2.9 03/26/2020   Lab Results  Component Value Date   HGBA1C 6.0 (A) 09/16/2020      Assessment & Plan:   Problem List Items Addressed This Visit      Digestive   Irritable bowel syndrome with constipation    She would like to go up on her Linzess I think this would be perfectly reasonable she says GI is going to call her back today.  In fact the higher dose on the Linzess is what is currently FDA approved for irritable bowel/constipation predominant.        Endocrine   Controlled diabetes mellitus  type 2 with complications [facility]) - Primary    Lab Results  Component Value Date   HGBA1C 6.0 (A) 09/16/2020   1 see looks phenomenal today she has brought it down from 7.1-6.0.  Just encouraged her to keep up the great work she is doing fantastic no significant hypoglycemic episodes.  Fastings have mostly been in the 120s which is great.  Getting her blood sugars under control will also help with her fatty liver.      Relevant Orders   POCT glycosylated hemoglobin (Hb A1C) (Completed)   POCT UA - Microalbumin (Completed)     Other   PACEMAKER, PERMANENT    She is trying to decide if she wants to get the battery replaced and her pacemaker is coming up on that time.      Breast cancer of upper-inner quadrant of left female breast [facility])    She is wanting to consider starting tamoxifen.  Encouraged her to reach out to her oncologist she actually already has a follow-up appointment in March but encouraged her to call their office now to see if they might be willing to call it in and then keep the appointment in March or not.       Other Visit Diagnoses    Urinary incontinence, unspecified type       Relevant Medications   darifenacin (ENABLEX) 7.5 MG 24 hr tablet   Other  Relevant Orders   WET PREP FOR TRICH, YEAST, CLUE   POCT URINALYSIS DIP (CLINITEK) (Completed)   Vaginal irritation       Relevant Orders   WET PREP FOR TRICH, YEAST, CLUE   OAB (overactive bladder)       Relevant Medications   darifenacin (ENABLEX) 7.5 MG 24 hr tablet      Urge incontinence-we did discuss options I would like to at least do a urinalysis just to rule out possible UTI since the symptoms definitely ramped up about 3 years ago. But she may also have some prolapse of her bladder tack that she had done about 10 years ago causing symptoms.  We certainly could consider a trial of an overactive bladder medication.  A 30-day trial is absolutely reasonable.  She thinks she took either Vesicare or Enablex years ago before she had her surgery  Vaginitis - wet prep preformed.  Will call with results.   Meds ordered this encounter  Medications  . darifenacin (ENABLEX) 7.5 MG 24 hr tablet    Sig: Take 1 tablet (7.5 mg total) by mouth daily.    Dispense:  30 tablet    Refill:  1    Follow-up: Return in about 4 months (around 01/14/2021) for DM/HTN.   I spent 42 minutes on the day of the encounter to include pre-visit record review, face-to-face time with the patient and post visit ordering of test.   Beatrice Lecher, MD

## 2020-09-16 NOTE — Assessment & Plan Note (Signed)
She would like to go up on her Linzess I think this would be perfectly reasonable she says GI is going to call her back today.  In fact the higher dose on the Linzess is what is currently FDA approved for irritable bowel/constipation predominant.

## 2020-09-16 NOTE — Assessment & Plan Note (Signed)
She is trying to decide if she wants to get the battery replaced and her pacemaker is coming up on that time.

## 2020-09-16 NOTE — Assessment & Plan Note (Signed)
She is wanting to consider starting tamoxifen.  Encouraged her to reach out to her oncologist she actually already has a follow-up appointment in March but encouraged her to call their office now to see if they might be willing to call it in and then keep the appointment in March or not.

## 2020-09-17 LAB — WET PREP FOR TRICH, YEAST, CLUE
MICRO NUMBER:: 11451566
Specimen Quality: ADEQUATE

## 2020-09-26 ENCOUNTER — Other Ambulatory Visit: Payer: Self-pay | Admitting: *Deleted

## 2020-09-26 DIAGNOSIS — I442 Atrioventricular block, complete: Secondary | ICD-10-CM | POA: Diagnosis not present

## 2020-09-26 DIAGNOSIS — R55 Syncope and collapse: Secondary | ICD-10-CM | POA: Diagnosis not present

## 2020-09-26 DIAGNOSIS — G4733 Obstructive sleep apnea (adult) (pediatric): Secondary | ICD-10-CM | POA: Diagnosis not present

## 2020-09-26 DIAGNOSIS — I951 Orthostatic hypotension: Secondary | ICD-10-CM | POA: Diagnosis not present

## 2020-09-26 DIAGNOSIS — J4541 Moderate persistent asthma with (acute) exacerbation: Secondary | ICD-10-CM | POA: Diagnosis not present

## 2020-09-26 DIAGNOSIS — E039 Hypothyroidism, unspecified: Secondary | ICD-10-CM | POA: Diagnosis not present

## 2020-09-26 DIAGNOSIS — N62 Hypertrophy of breast: Secondary | ICD-10-CM | POA: Diagnosis not present

## 2020-09-26 DIAGNOSIS — D0511 Intraductal carcinoma in situ of right breast: Secondary | ICD-10-CM | POA: Diagnosis not present

## 2020-09-26 DIAGNOSIS — E118 Type 2 diabetes mellitus with unspecified complications: Secondary | ICD-10-CM | POA: Diagnosis not present

## 2020-09-26 DIAGNOSIS — K7581 Nonalcoholic steatohepatitis (NASH): Secondary | ICD-10-CM | POA: Diagnosis not present

## 2020-09-26 MED ORDER — SOLIFENACIN SUCCINATE 5 MG PO TABS: 5.0000 mg | ORAL_TABLET | Freq: Every day | ORAL | 1 refills | Status: DC

## 2020-09-26 NOTE — Progress Notes (Signed)
enablex 7.5 mg not covered by pt's insurance. Changed to vesicare 5 mg.

## 2020-09-29 ENCOUNTER — Other Ambulatory Visit: Payer: Self-pay | Admitting: Family Medicine

## 2020-09-30 ENCOUNTER — Ambulatory Visit (INDEPENDENT_AMBULATORY_CARE_PROVIDER_SITE_OTHER): Payer: Medicare Other

## 2020-09-30 ENCOUNTER — Telehealth: Payer: Medicare Other | Admitting: Sports Medicine

## 2020-09-30 DIAGNOSIS — I442 Atrioventricular block, complete: Secondary | ICD-10-CM

## 2020-10-02 LAB — CUP PACEART REMOTE DEVICE CHECK
Battery Remaining Longevity: 20 mo
Battery Voltage: 2.91 V
Brady Statistic AP VP Percent: 16.76 %
Brady Statistic AP VS Percent: 0.05 %
Brady Statistic AS VP Percent: 80.32 %
Brady Statistic AS VS Percent: 2.86 %
Brady Statistic RA Percent Paced: 16.22 %
Brady Statistic RV Percent Paced: 93.93 %
Date Time Interrogation Session: 20220208123224
Implantable Lead Implant Date: 20030324
Implantable Lead Implant Date: 20030324
Implantable Lead Implant Date: 20030324
Implantable Lead Location: 753858
Implantable Lead Location: 753859
Implantable Lead Location: 753860
Implantable Lead Model: 4068
Implantable Lead Model: 4068
Implantable Lead Model: 4193
Implantable Pulse Generator Implant Date: 20151231
Lead Channel Impedance Value: 1007 Ohm
Lead Channel Impedance Value: 380 Ohm
Lead Channel Impedance Value: 399 Ohm
Lead Channel Impedance Value: 4047 Ohm
Lead Channel Impedance Value: 4047 Ohm
Lead Channel Impedance Value: 4047 Ohm
Lead Channel Impedance Value: 418 Ohm
Lead Channel Impedance Value: 570 Ohm
Lead Channel Impedance Value: 836 Ohm
Lead Channel Pacing Threshold Amplitude: 0.875 V
Lead Channel Pacing Threshold Amplitude: 1.875 V
Lead Channel Pacing Threshold Pulse Width: 0.4 ms
Lead Channel Pacing Threshold Pulse Width: 0.4 ms
Lead Channel Sensing Intrinsic Amplitude: 1.375 mV
Lead Channel Sensing Intrinsic Amplitude: 1.375 mV
Lead Channel Sensing Intrinsic Amplitude: 8.375 mV
Lead Channel Sensing Intrinsic Amplitude: 8.375 mV
Lead Channel Setting Pacing Amplitude: 1.5 V
Lead Channel Setting Pacing Amplitude: 2.5 V
Lead Channel Setting Pacing Amplitude: 3.5 V
Lead Channel Setting Pacing Pulse Width: 0.8 ms
Lead Channel Setting Pacing Pulse Width: 0.8 ms
Lead Channel Setting Sensing Sensitivity: 4 mV

## 2020-10-07 NOTE — Progress Notes (Signed)
Remote pacemaker transmission.   

## 2020-10-09 ENCOUNTER — Telehealth: Payer: Self-pay | Admitting: Family Medicine

## 2020-10-09 NOTE — Telephone Encounter (Signed)
Patient called and left a voicemail stating that she has a question about a pain medicine she was given and would like for someone to call her back.

## 2020-10-10 NOTE — Telephone Encounter (Signed)
Pt called and said that the Vesicare has caused her to have IBS. She said she hasn't been able to "Let go" and she has taken the maximum that she can take of her linzess.   She wanted to know if she can take the Vesicare every other day.    Also she has a "jelly-like  Bump" on her L knee that it went down and thinks that its just fluid and wanted to know if she should get this checked out.   She stated that she has been trying to walk for 30 min daily and feels that this has caused this to flare up. Pt advised that she should have this checked out. She doesn't want to go back to see Dr. Darene Lamer. She said that he was pushing "tranquilizers" and she doesn't want to do that. He's a good doctor but we just didn't gel after my last visit.  She asked if she can get this x rayed first before she sees someone else. She also doesn't want to see the person that he sent her to for the injections in her neck.   Will fwd to pcp for advice

## 2020-10-10 NOTE — Telephone Encounter (Signed)
Yes okay to take the Vesicare every other day or even just take it occasionally if she is gone to go out of the house and skip days where she is got be home that would be perfectly fine.  I am happy to look at the bump on her knee next week if she would like this does not need an urgent appointment.  I would recommend rest ice and gentle range of motion and if it is not better after the weekend I am more than happy to see her next week and evaluate it to see if she might need x-rays.[name]

## 2020-10-10 NOTE — Telephone Encounter (Signed)
Called and informed pt of recommendations.  She stated that it is not a bump it is a fatty tumor and its under her knee cap?  She will do the ice and gentle stretches because she has been using heat for this. She will call if sxs get worse.

## 2020-10-14 DIAGNOSIS — Z79899 Other long term (current) drug therapy: Secondary | ICD-10-CM | POA: Diagnosis not present

## 2020-10-14 DIAGNOSIS — K219 Gastro-esophageal reflux disease without esophagitis: Secondary | ICD-10-CM | POA: Diagnosis not present

## 2020-10-14 DIAGNOSIS — M25862 Other specified joint disorders, left knee: Secondary | ICD-10-CM | POA: Diagnosis not present

## 2020-10-14 DIAGNOSIS — J45909 Unspecified asthma, uncomplicated: Secondary | ICD-10-CM | POA: Diagnosis not present

## 2020-10-14 DIAGNOSIS — Z885 Allergy status to narcotic agent status: Secondary | ICD-10-CM | POA: Diagnosis not present

## 2020-10-14 DIAGNOSIS — Z882 Allergy status to sulfonamides status: Secondary | ICD-10-CM | POA: Diagnosis not present

## 2020-10-14 DIAGNOSIS — E119 Type 2 diabetes mellitus without complications: Secondary | ICD-10-CM | POA: Diagnosis not present

## 2020-10-14 DIAGNOSIS — M1712 Unilateral primary osteoarthritis, left knee: Secondary | ICD-10-CM | POA: Diagnosis not present

## 2020-10-14 DIAGNOSIS — M79605 Pain in left leg: Secondary | ICD-10-CM | POA: Diagnosis not present

## 2020-10-14 DIAGNOSIS — Z794 Long term (current) use of insulin: Secondary | ICD-10-CM | POA: Diagnosis not present

## 2020-10-14 DIAGNOSIS — Z888 Allergy status to other drugs, medicaments and biological substances status: Secondary | ICD-10-CM | POA: Diagnosis not present

## 2020-10-14 DIAGNOSIS — Z886 Allergy status to analgesic agent status: Secondary | ICD-10-CM | POA: Diagnosis not present

## 2020-10-14 DIAGNOSIS — M25562 Pain in left knee: Secondary | ICD-10-CM | POA: Diagnosis not present

## 2020-10-14 DIAGNOSIS — Z880 Allergy status to penicillin: Secondary | ICD-10-CM | POA: Diagnosis not present

## 2020-10-14 DIAGNOSIS — G4733 Obstructive sleep apnea (adult) (pediatric): Secondary | ICD-10-CM | POA: Diagnosis not present

## 2020-10-14 DIAGNOSIS — E739 Lactose intolerance, unspecified: Secondary | ICD-10-CM | POA: Diagnosis not present

## 2020-10-14 DIAGNOSIS — Z881 Allergy status to other antibiotic agents status: Secondary | ICD-10-CM | POA: Diagnosis not present

## 2020-10-14 DIAGNOSIS — J449 Chronic obstructive pulmonary disease, unspecified: Secondary | ICD-10-CM | POA: Diagnosis not present

## 2020-10-14 DIAGNOSIS — M25762 Osteophyte, left knee: Secondary | ICD-10-CM | POA: Diagnosis not present

## 2020-10-14 DIAGNOSIS — Z87891 Personal history of nicotine dependence: Secondary | ICD-10-CM | POA: Diagnosis not present

## 2020-10-14 DIAGNOSIS — E079 Disorder of thyroid, unspecified: Secondary | ICD-10-CM | POA: Diagnosis not present

## 2020-10-14 DIAGNOSIS — Z91041 Radiographic dye allergy status: Secondary | ICD-10-CM | POA: Diagnosis not present

## 2020-10-14 DIAGNOSIS — I509 Heart failure, unspecified: Secondary | ICD-10-CM | POA: Diagnosis not present

## 2020-10-14 DIAGNOSIS — Z91048 Other nonmedicinal substance allergy status: Secondary | ICD-10-CM | POA: Diagnosis not present

## 2020-10-14 DIAGNOSIS — K7460 Unspecified cirrhosis of liver: Secondary | ICD-10-CM | POA: Diagnosis not present

## 2020-10-14 DIAGNOSIS — I10 Essential (primary) hypertension: Secondary | ICD-10-CM | POA: Diagnosis not present

## 2020-10-14 DIAGNOSIS — K589 Irritable bowel syndrome without diarrhea: Secondary | ICD-10-CM | POA: Diagnosis not present

## 2020-10-16 DIAGNOSIS — Z1231 Encounter for screening mammogram for malignant neoplasm of breast: Secondary | ICD-10-CM | POA: Diagnosis not present

## 2020-10-16 DIAGNOSIS — R928 Other abnormal and inconclusive findings on diagnostic imaging of breast: Secondary | ICD-10-CM | POA: Diagnosis not present

## 2020-10-21 DIAGNOSIS — M79671 Pain in right foot: Secondary | ICD-10-CM | POA: Diagnosis not present

## 2020-10-21 DIAGNOSIS — L84 Corns and callosities: Secondary | ICD-10-CM | POA: Diagnosis not present

## 2020-10-21 DIAGNOSIS — M79672 Pain in left foot: Secondary | ICD-10-CM | POA: Diagnosis not present

## 2020-10-21 DIAGNOSIS — E119 Type 2 diabetes mellitus without complications: Secondary | ICD-10-CM | POA: Diagnosis not present

## 2020-10-21 DIAGNOSIS — L602 Onychogryphosis: Secondary | ICD-10-CM | POA: Diagnosis not present

## 2020-10-22 DIAGNOSIS — G4733 Obstructive sleep apnea (adult) (pediatric): Secondary | ICD-10-CM | POA: Diagnosis not present

## 2020-10-22 DIAGNOSIS — Z713 Dietary counseling and surveillance: Secondary | ICD-10-CM | POA: Diagnosis not present

## 2020-10-22 DIAGNOSIS — E785 Hyperlipidemia, unspecified: Secondary | ICD-10-CM | POA: Diagnosis not present

## 2020-10-22 DIAGNOSIS — I5022 Chronic systolic (congestive) heart failure: Secondary | ICD-10-CM | POA: Diagnosis not present

## 2020-10-23 ENCOUNTER — Other Ambulatory Visit: Payer: Self-pay

## 2020-10-23 ENCOUNTER — Ambulatory Visit (INDEPENDENT_AMBULATORY_CARE_PROVIDER_SITE_OTHER): Payer: Medicare Other | Admitting: Family Medicine

## 2020-10-23 ENCOUNTER — Encounter: Payer: Self-pay | Admitting: Family Medicine

## 2020-10-23 VITALS — BP 138/58 | HR 87 | Ht 62.0 in | Wt 214.0 lb

## 2020-10-23 DIAGNOSIS — E118 Type 2 diabetes mellitus with unspecified complications: Secondary | ICD-10-CM

## 2020-10-23 DIAGNOSIS — M25562 Pain in left knee: Secondary | ICD-10-CM

## 2020-10-23 NOTE — Progress Notes (Signed)
Established Patient Office Visit  Subjective:  Patient ID: Tanya Hogan, female    DOB: Jul 05, 1951  Age: 70 y.o. MRN: [id number]  CC:  Chief Complaint  Patient presents with  . Follow-up    Seen at [facility] on 2/21 for her L Knee    HPI Tanya Hogan presents for left knee pain.  She was seen at [facility] on February 21 for her knee pain.  She said it had been bothering her for a couple of weeks after she had gone out her backyard she was wearing loosefitting shoes and the yard was wet and muddy.  She does not remember specifically injuring her knee but just remembers after going back out there and coming back into the house that she was having some knee pain.  It continued to get worse and started to swell to the point that she was having significant pain she had tried icing it and heating it.  The swelling just was not going down so she went to the emergency department.  She was worried that she might have a blood clot at that point.  She had a Doppler which was negative and an x-ray just showing significant tricompartmental arthritis.  She is overall feeling some better she says the swelling has gone down significantly it still sore to stand on it.  But it is gradually getting better.  Diabetes-she wanted let me know she is up to 22 units on her insulin she had bumped it up slightly because her glucoses were running a little greater than 120.  She has been trying to eat around 1200 to 1300 cal/day  Past Medical History:  Diagnosis Date  . Alkaline phosphatase elevation   . Asthma   . Breast cancer ([place]) 2011  . Carpal tunnel syndrome   . Carpal tunnel syndrome    bilateral  . CHF (congestive heart failure) ([place])   . Cirrhosis, non-alcoholic ([place])   . Closed fracture of unspecified part of radius (alone)   . DDD (degenerative disc disease), lumbar   . Depression    pt denies  . Diabetes mellitus without complication ([place])   . Enlarged heart   . Fibromyalgia   .  GERD (gastroesophageal reflux disease)   . History of kidney stones   . HTN (hypertension)   . Hypothyroidism   . IBS (irritable bowel syndrome)   . LBBB (left bundle branch block)   . Microcytic anemia 05/14/2017  . Pneumonia   . Presence of permanent cardiac pacemaker   . PVD (peripheral vascular disease) ([place])    2019  . Sleep apnea    uses cpap    Past Surgical History:  Procedure Laterality Date  . BREAST LUMPECTOMY Left 2011  . COLONOSCOPY    . FOOT SURGERY    . KNEE SURGERY    . MASTECTOMY Right    05/2019  . PACEMAKER GENERATOR CHANGE N/A 08/23/2014   Procedure: PACEMAKER GENERATOR CHANGE;  Surgeon: Evans Lance, MD;  Location: [facility] CATH LAB;  Service: Cardiovascular;  Laterality: N/A;  . PACEMAKER PLACEMENT  2009   [facility]  . RECTAL SURGERY  1976  . REDUCTION MAMMAPLASTY Left    05/2019  . TOTAL SHOULDER ARTHROPLASTY Right 03/10/2018  . TOTAL SHOULDER ARTHROPLASTY Right 03/10/2018   Procedure: RIGHT TOTAL SHOULDER ARTHROPLASTY;  Surgeon: [name], MD;  Location: [place];  Service: Orthopedics;  Laterality: Right;  . TUBAL LIGATION      Family History  Problem Relation  Age of Onset  . Melanoma Mother   . Parkinson's disease Mother   . Heart failure Other   . Breast cancer Other   . Hypertension Other     Social History   Socioeconomic History  . Marital status: Divorced    Spouse name: Not on file  . Number of children: 2  . Years of education: 97  . Highest education level: Associate degree: academic program  Occupational History  . 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 Smoking status: Former Smoker    Packs/day: 0.00    Years: 0.00    Pack years: 0.00    Quit date: 08/24/1966    Years since quitting: 54.2  . Smokeless tobacco: Never Used  Vaping Use  . Vaping Use: Never used  Substance and Sexual Activity  . Alcohol use: No  . Drug use: No  . Sexual activity: Not Currently   Other Topics Concern  . Not on file  Social History Narrative   Retired. 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 Likes Systems developer and music. Loves animals.   Social Determinants of Health   Financial Resource Strain: Not on file  Food Insecurity: Not on file  Transportation Needs: No Transportation Needs  . Lack of Transportation (Medical): No  . Lack of Transportation (Non-Medical): No  Physical Activity: Not on file  Stress: Not on file  Social Connections: Not on file  Intimate Partner Violence: Unknown  . Fear of Current or Ex-Partner: No  . Emotionally Abused: Not on file  . Physically Abused: Not on file  . Sexually Abused: Not on file    Outpatient Medications Prior to Visit  Medication Sig Dispense Refill  . Accu-Chek FastClix Lancets MISC Apply topically.    [name] alendronate (FOSAMAX) 70 MG tablet Take 1 tablet (70 mg total) by mouth every 7 (seven) days. Take with a full glass of water on an empty stomach. 12 tablet 4  . AMBULATORY NON FORMULARY MEDICATION Medication Name: Tubing and supplies for nebulizer. Fax - [phone] 1 vial 0  . amiodarone (PACERONE) 200 MG tablet Take 0.5 tablets (100 mg total) by mouth daily. 45 tablet 3  . blood glucose meter kit and supplies KIT Dispense based on patient and insurance preference. 1 each 0  . bumetanide (BUMEX) 1 MG tablet TAKE 1 TABLET BY MOUTH  TWICE DAILY 180 tablet 3  . cetirizine (ZYRTEC) 10 MG tablet Take 10 mg by mouth daily.    [name] dicyclomine (BENTYL) 20 MG tablet Take by mouth.    . diltiazem (CARDIZEM CD) 180 MG 24 hr capsule Take 1 capsule (180 mg total) by mouth daily. 90 capsule 1  . EPIPEN 2-PAK 0.3 MG/0.3ML SOAJ injection INJECT 1 PEN INTO MUSCLE OF LATERAL THIGH FOR SEVERE ALLERGIC REACTION. CALL 911 AFTER USING THIS MEDICATION. 1 each 2  . fexofenadine-pseudoephedrine (ALLEGRA-D 24) 180-240 MG 24 hr tablet Take 1 tablet by mouth daily.    . Fluticasone-Salmeterol (ADVAIR) 250-50 MCG/DOSE AEPB Inhale 1  puff into the lungs in the morning and at bedtime. Inhale 1 puff into the lungs in the morning and at bedtime. Wash mouth out after using.    [name] glucose blood (ACCU-CHEK GUIDE) test strip Dx DM E11.21 - Check fasting blood sugar every morning before breakfast and before lunch and dinner. 3 times daily 900 each prn  . insulin glargine (LANTUS SOLOSTAR) 100 UNIT/ML Solostar Pen INJECT SUBCUTANEOUSLY 20UNITS AT BEDTIME (Patient taking differently:  Inject 22 Units into the skin at bedtime. INJECT SUBCUTANEOUSLY 20UNITS AT BEDTIME) 30 mL 3  . insulin lispro (HUMALOG KWIKPEN) 100 UNIT/ML KwikPen Inject 3-12 Units into the skin 3 (three) times daily as needed (hyperglycemia>150). Sliding scale. Administer subQ within 15 minutes before or immediately after a meal 9 mL 2  . Insulin Pen Needle 31G X 8 MM MISC To be used for injecting insulin Dx: E11.8 300 each 4  . Lancets Misc. (ACCU-CHEK FASTCLIX LANCET) KIT E11.21 Dx DM Check fasting blood sugar every morning before breakfast and before lunch and dinner. 3 times daily 300 kit prn  . levothyroxine (SYNTHROID) 75 MCG tablet TAKE 1 TABLET BY MOUTH  DAILY 90 tablet 3  . LINZESS 290 MCG CAPS capsule Take 290 mcg by mouth daily.    . Multiple Vitamins-Minerals (OCUVITE EYE + MULTI) TABS Take 1 tablet by mouth daily.    . Olopatadine HCl 0.2 % SOLN Place 1 drop into both eyes daily.    . pantoprazole (PROTONIX) 40 MG tablet Take 40 mg by mouth daily.    . pravastatin (PRAVACHOL) 40 MG tablet TAKE 1 TABLET BY MOUTH AT  BEDTIME 90 tablet 3  . PROAIR HFA 108 (90 Base) MCG/ACT inhaler USE 1 TO 2 INHALATIONS BY  MOUTH EVERY 6 HOURS AS  NEEDED FOR WHEEZING OR  SHORTNESS OF BREATH 34 g 3  . RESTASIS 0.05 % ophthalmic emulsion     . tiotropium (SPIRIVA HANDIHALER) 18 MCG inhalation capsule INHALE THE CONTENTS OF 1  CAPSULE BY MOUTH VIA  HANDIHALER DAILY 90 capsule 2  . valACYclovir (VALTREX) 1000 MG tablet TAKE 1 TABLET BY MOUTH TWICE DAILY 14 tablet 2  . LINZESS 145 MCG  CAPS capsule Take 145 mcg by mouth daily.    . montelukast (SINGULAIR) 10 MG tablet Take 1 tablet by mouth at bedtime.    . solifenacin (VESICARE) 5 MG tablet Take 1 tablet (5 mg total) by mouth daily. 30 tablet 1   No facility-administered medications prior to visit.    Allergies  Allergen Reactions  . Penicillins Anaphylaxis    Has patient had a PCN reaction causing immediate rash, facial/tongue/throat swelling, SOB or lightheadedness with hypotension: Yes Has patient had a PCN reaction causing severe rash involving mucus membranes or skin necrosis: No Has patient had a PCN reaction that required hospitalization: No Has patient had a PCN reaction occurring within the last 10 years: No If all of the above answers are "NO", then may proceed with Cephalosporin use.   [name] Hcl] Other (See Comments)    Hyperkalemia  . Atorvastatin Other (See Comments)    msucle aches  . Celebrex [Celecoxib]     ulcers  . Fluticasone Other (See Comments)    Nasal ulcer.    . Levocetirizine     Doesn't recall  . Losartan Potassium Itching  . Naproxen Swelling  . Nasonex [Mometasone Furoate] Other (See Comments)    nosebleed  . Vesicare [Solifenacin] Other (See Comments)    "stopped me from urinating"  . Aspirin Other (See Comments)    Pt states "burns holes in stomach", ulcers   . Baking Soda-Fluoride [Sodium Fluoride] Itching  . Clindamycin/Lincomycin Rash  . Codeine Nausea And Vomiting  . Furosemide Other (See Comments)    Nose bleed  . Lactose   . Lactose Intolerance (Gi) Other (See Comments)    Increased calcium, sneezing, nasal issues  . Lanolin-Petrolatum Rash  . Metformin And Related Diarrhea  . Miralax [Polyethylene  Glycol 3350]   . Miralax [Polyethylene Glycol] Rash  . Nasonex [Mometasone Furoate] Other (See Comments)  . Quinine Rash  . Rifampin Other (See Comments)    DOES NOT REMEMBER THIS MEDICATION OR ALLERGY  . Sulfa Antibiotics Rash  . Sulfonamide  Derivatives Rash  . Tramadol Rash    Anxiety  . Wellbutrin [Bupropion] Other (See Comments)    Muscle aches, pain, "made my skin crawl"  . Wool Alcohol [Lanolin] Rash  . Zoledronic Acid Other (See Comments) and Rash    shaking    ROS Review of Systems    Objective:    Physical Exam  BP (!) 138/58   Pulse 87   Ht 5' 2"  (1.575 m)   Wt 214 lb (97.1 kg)   SpO2 94%   BMI 39.14 kg/m  Wt Readings from Last 3 Encounters:  10/23/20 214 lb (97.1 kg)  09/16/20 197 lb (89.4 kg)  06/11/20 219 lb (99.3 kg)     Health Maintenance Due  Topic Date Due  . OPHTHALMOLOGY EXAM  04/08/2019  . COVID-19 Vaccine (3 - Moderna risk 4-dose series) 12/27/2019    There are no preventive care reminders to display for this patient.  Lab Results  Component Value Date   TSH 1.27 01/09/2020   Lab Results  Component Value Date   WBC 6.5 03/14/2019   HGB 14.1 03/14/2019   HCT 42.7 03/14/2019   MCV 92.0 03/14/2019   PLT 287 03/14/2019   Lab Results  Component Value Date   NA 140 03/26/2020   K 4.8 03/26/2020   CHLORIDE 105 10/30/2014   CO2 27 03/26/2020   GLUCOSE 123 (H) 03/26/2020   BUN 19 03/26/2020   CREATININE 0.89 03/26/2020   BILITOT 0.4 03/26/2020   ALKPHOS 100 03/02/2018   AST 20 03/26/2020   ALT 16 03/26/2020   PROT 6.6 03/26/2020   ALBUMIN 4.0 03/02/2018   CALCIUM 9.9 03/26/2020   ANIONGAP 8 03/11/2018   EGFR 78 (L) 10/30/2014   Lab Results  Component Value Date   CHOL 194 03/26/2020   Lab Results  Component Value Date   HDL 67 03/26/2020   Lab Results  Component Value Date   LDLCALC 99 03/26/2020   Lab Results  Component Value Date   TRIG 181 (H) 03/26/2020   Lab Results  Component Value Date   CHOLHDL 2.9 03/26/2020   Lab Results  Component Value Date   HGBA1C 6.0 (A) 09/16/2020      Assessment & Plan:   Problem List Items Addressed This Visit      Endocrine   Controlled diabetes mellitus type 2 with complications ([place])    To 22 units on  insulin.  Doing well.  She should have a regular schedule followed up in the next couple of weeks and I will see her then we can address further at that time.       Other Visit Diagnoses    Acute pain of left knee    -  Primary     Left knee pain secondary to osteoarthritis-knee exam is fairly benign today she is a little bit tender just superiorly above the patella.  Both of her patellas are very laterally positioned.  She does have a lipoma/fatty deposit medially to her left knee.  This was the area of most of her pain and swelling.  There is no erythema or induration or rash at this point.  Discussed working on gentle range of motion given handout today for patellofemoral  syndrome.  Also discussed working on strengthening her upper leg as she reports that sometimes she feels like that knee is getting give out if she is been standing for long periods that actually been going on for a long time is not a new problem.  If it flares again or continues to bother her then we will get her in with sports medicine.  No orders of the defined types were placed in this encounter.   Follow-up: No follow-ups on file.    Beatrice Lecher, MD

## 2020-10-23 NOTE — Assessment & Plan Note (Signed)
To 22 units on insulin.  Doing well.  She should have a regular schedule followed up in the next couple of weeks and I will see her then we can address further at that time.

## 2020-10-29 DIAGNOSIS — G4733 Obstructive sleep apnea (adult) (pediatric): Secondary | ICD-10-CM | POA: Diagnosis not present

## 2020-10-29 DIAGNOSIS — G4737 Central sleep apnea in conditions classified elsewhere: Secondary | ICD-10-CM | POA: Diagnosis not present

## 2020-10-29 DIAGNOSIS — M5030 Other cervical disc degeneration, unspecified cervical region: Secondary | ICD-10-CM | POA: Diagnosis not present

## 2020-10-30 IMAGING — MG DIGITAL SCREENING UNILAT LEFT W/ TOMO W/ CAD
6 series · 6 of 18 positions shown · non-contrast
Comparison: Previous exam(s).

CLINICAL DATA: Screening.

EXAM:
DIGITAL SCREENING UNILATERAL LEFT MAMMOGRAM WITH CAD AND TOMO

[L CC synth-2D]
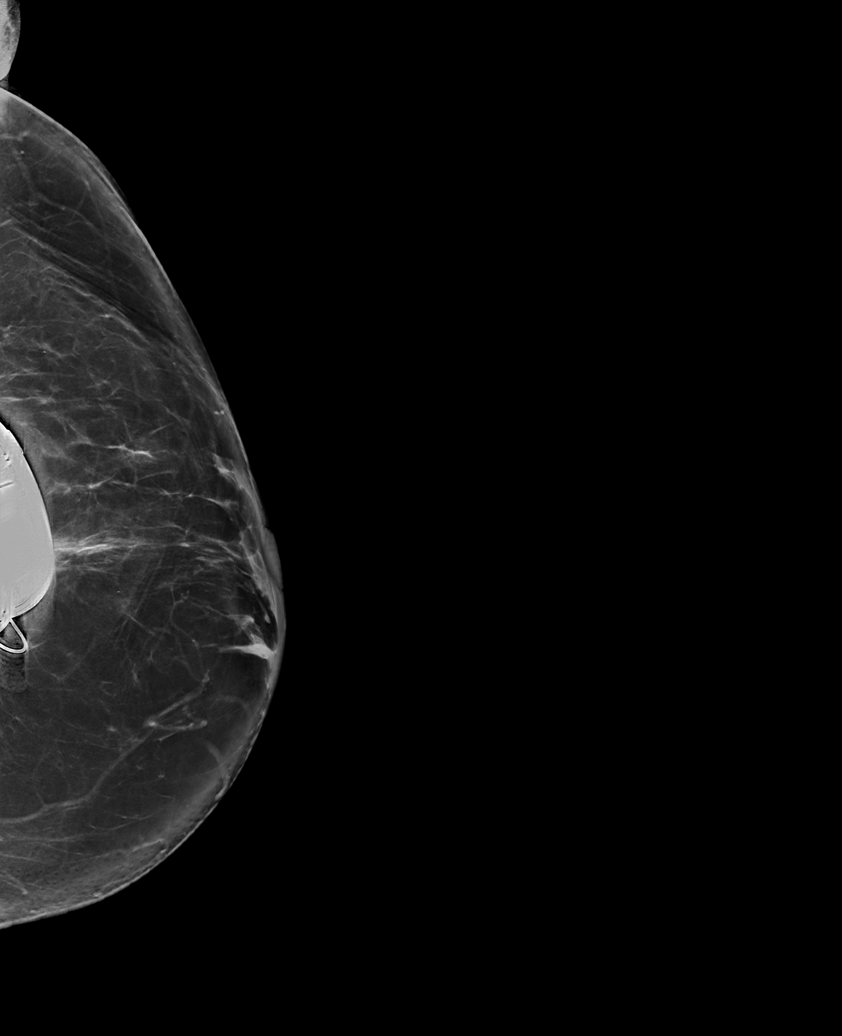

[L MLO synth-2D (1 of 2)]
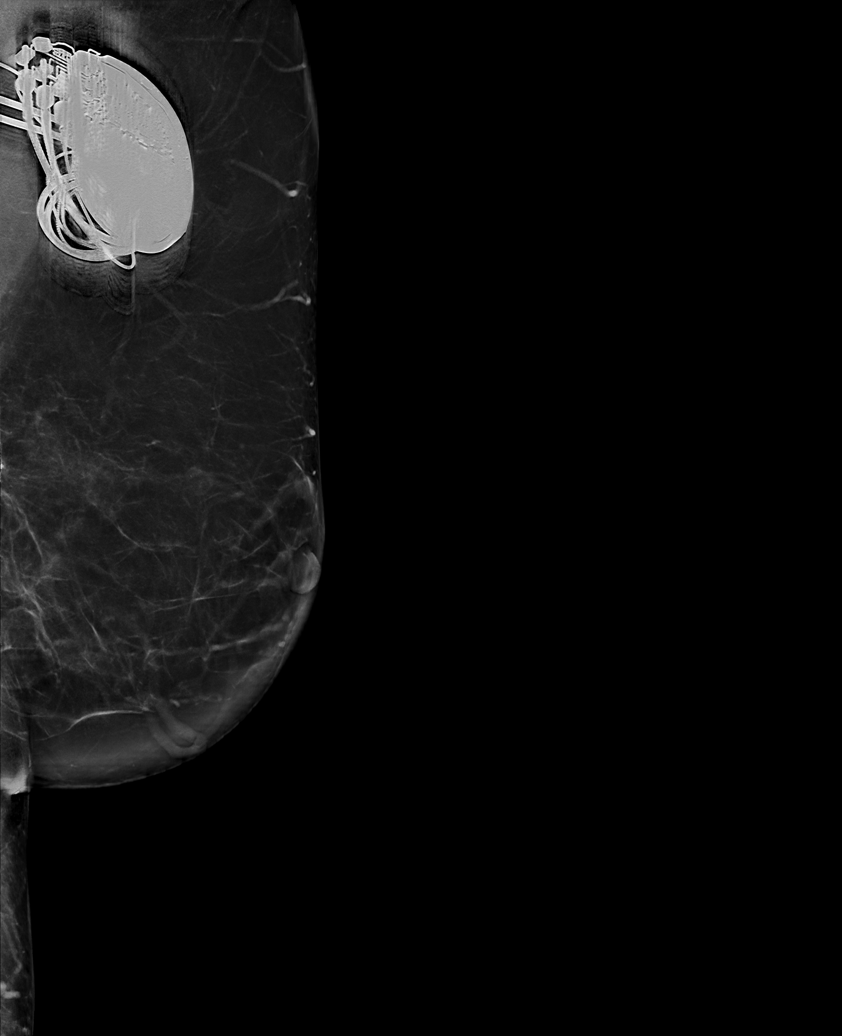

[L MLO synth-2D (2 of 2)]
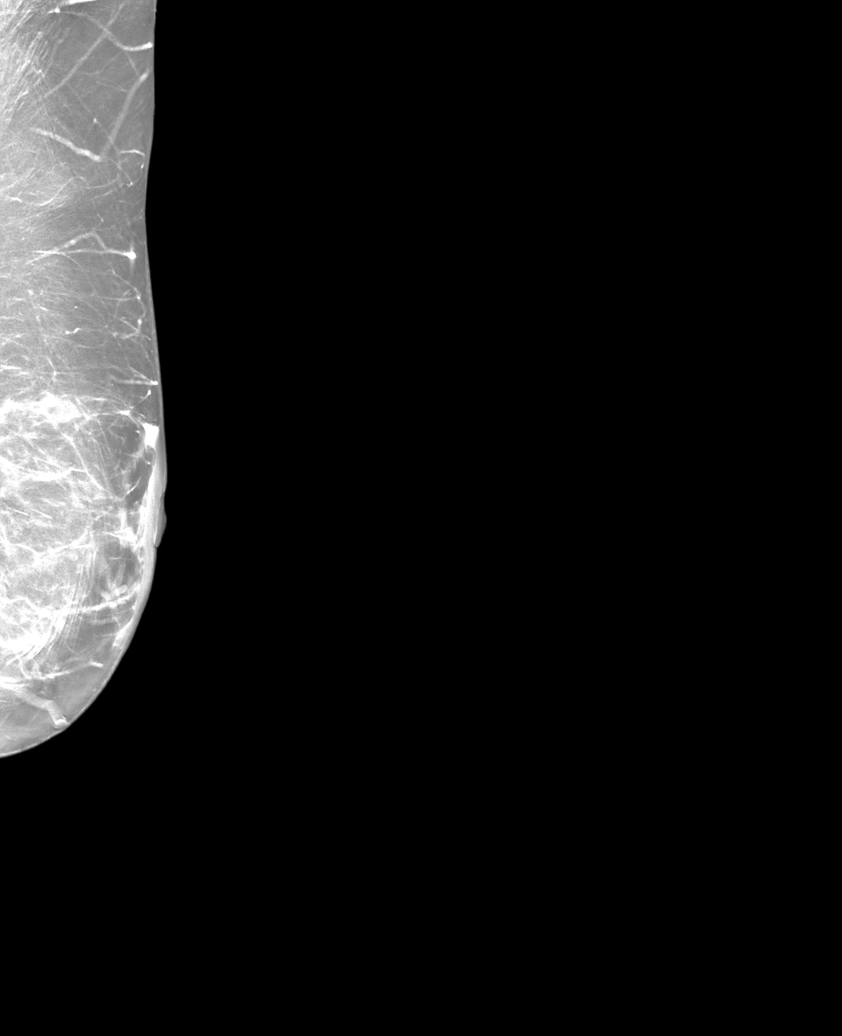

[L CC tomo · tomo slice 41/82.0]
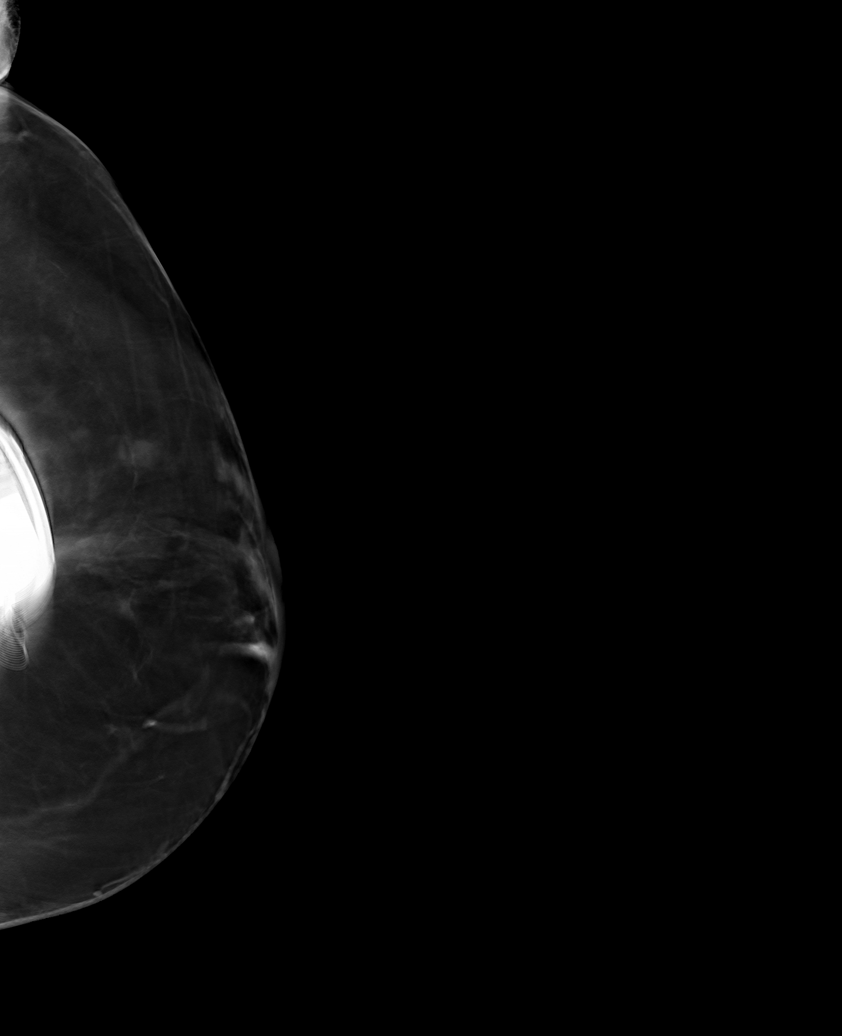

[L MLO tomo (1 of 2) · tomo slice 47/92.0]
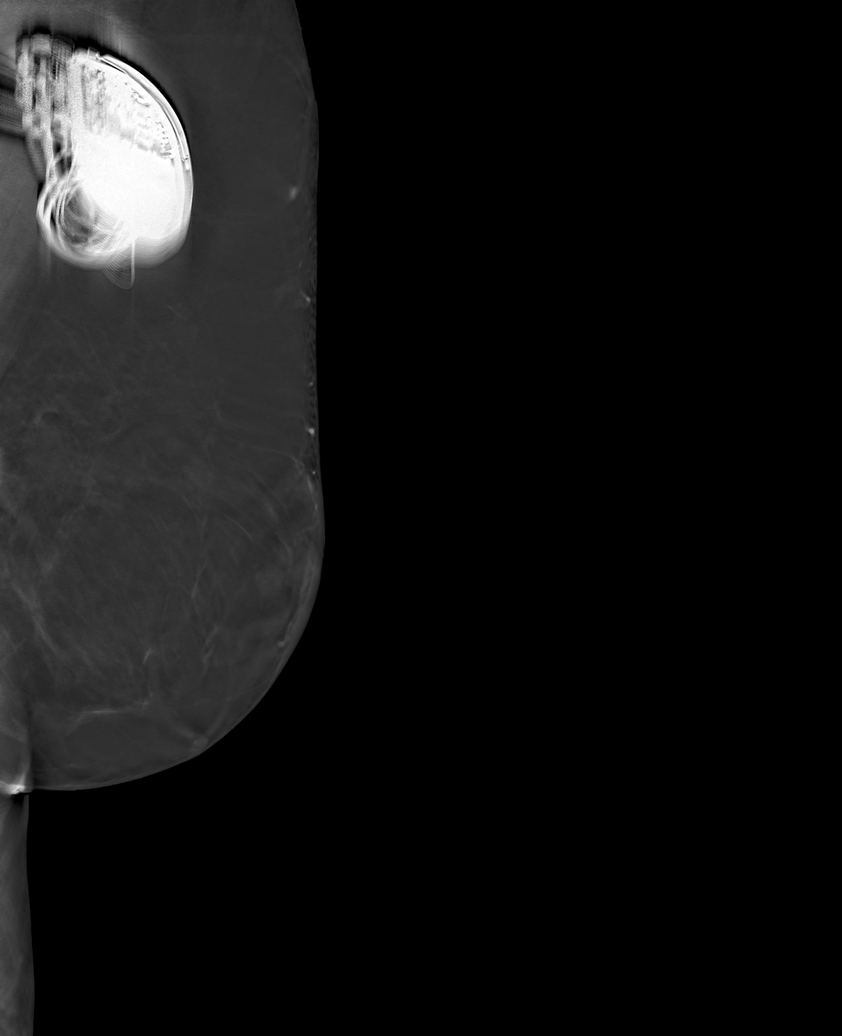

[L MLO tomo (2 of 2) · tomo slice 35/69.0]
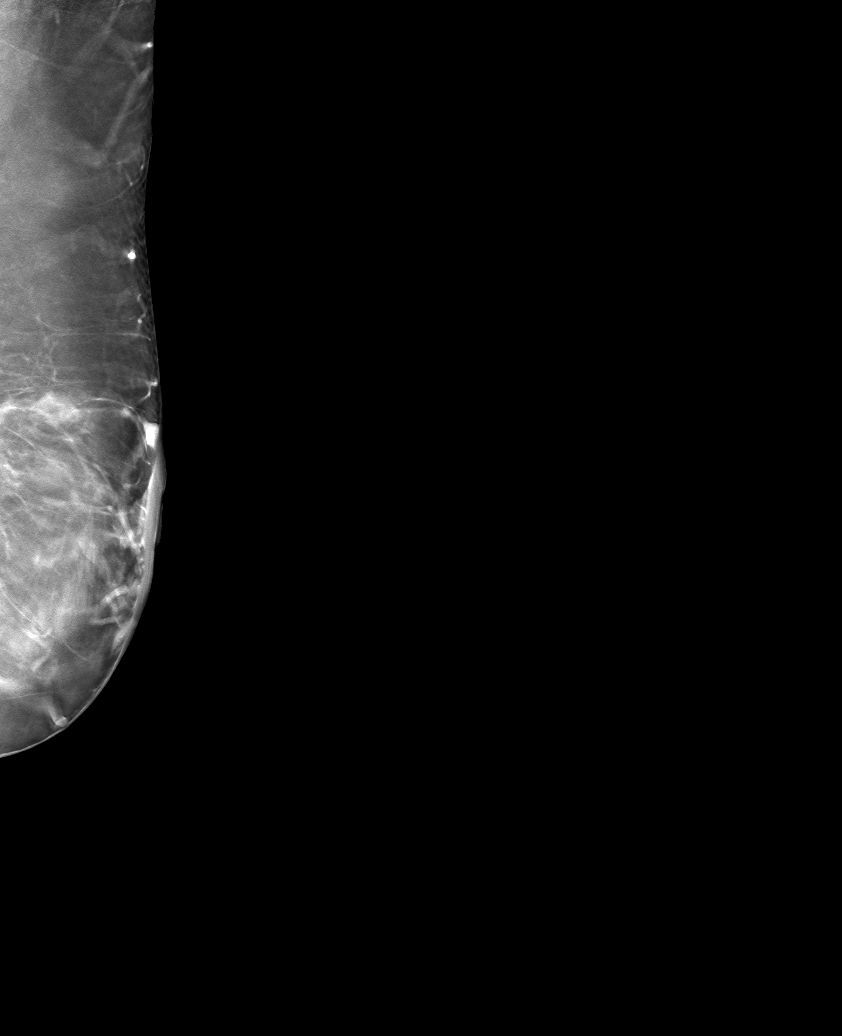

[6 of 18 positions shown; findings below may reference images not displayed]

ACR Breast Density Category b: There are scattered areas of
fibroglandular density.
FINDINGS: The patient has had a right mastectomy. There are no findings
suspicious for malignancy.

Images were processed with CAD.
IMPRESSION: No mammographic evidence of malignancy. A result letter of this
screening mammogram will be mailed directly to the patient.

RECOMMENDATION:
Screening mammogram in one year.  (Code:GY-Q-70Y)

BI-RADS CATEGORY  1: Negative.

## 2020-10-31 DIAGNOSIS — G4733 Obstructive sleep apnea (adult) (pediatric): Secondary | ICD-10-CM | POA: Diagnosis not present

## 2020-10-31 DIAGNOSIS — I472 Ventricular tachycardia: Secondary | ICD-10-CM | POA: Diagnosis not present

## 2020-10-31 DIAGNOSIS — I951 Orthostatic hypotension: Secondary | ICD-10-CM | POA: Diagnosis not present

## 2020-11-05 ENCOUNTER — Encounter: Payer: Self-pay | Admitting: Family Medicine

## 2020-11-10 ENCOUNTER — Other Ambulatory Visit: Payer: Self-pay | Admitting: Family Medicine

## 2020-11-20 DIAGNOSIS — K7581 Nonalcoholic steatohepatitis (NASH): Secondary | ICD-10-CM | POA: Diagnosis not present

## 2020-11-20 DIAGNOSIS — K581 Irritable bowel syndrome with constipation: Secondary | ICD-10-CM | POA: Diagnosis not present

## 2020-11-20 DIAGNOSIS — I10 Essential (primary) hypertension: Secondary | ICD-10-CM | POA: Diagnosis not present

## 2020-11-25 DIAGNOSIS — C50911 Malignant neoplasm of unspecified site of right female breast: Secondary | ICD-10-CM | POA: Diagnosis not present

## 2020-11-25 DIAGNOSIS — R0602 Shortness of breath: Secondary | ICD-10-CM | POA: Diagnosis not present

## 2020-11-29 ENCOUNTER — Ambulatory Visit: Payer: Medicare Other | Admitting: Internal Medicine

## 2020-11-29 ENCOUNTER — Encounter: Payer: Self-pay | Admitting: Internal Medicine

## 2020-11-29 ENCOUNTER — Other Ambulatory Visit: Payer: Self-pay

## 2020-11-29 VITALS — BP 122/74 | HR 82 | Ht 62.0 in | Wt 216.2 lb

## 2020-11-29 DIAGNOSIS — I442 Atrioventricular block, complete: Secondary | ICD-10-CM | POA: Diagnosis not present

## 2020-11-29 DIAGNOSIS — I10 Essential (primary) hypertension: Secondary | ICD-10-CM | POA: Diagnosis not present

## 2020-11-29 DIAGNOSIS — I5022 Chronic systolic (congestive) heart failure: Secondary | ICD-10-CM | POA: Diagnosis not present

## 2020-11-29 NOTE — Progress Notes (Signed)
HPI Tanya Hogan returns today for followup. She is a pleasant 70 yo woman with chronic systolic heart failure and CHB, s/p Biv PPM insertion. She has had some increased thresholds but has been stable from a cardiac perspective. She has breast CA. She is s/p surgery. She has also had a breast resection.No syncope. She was in the hospital with a CHF exacerbation a couple of months ago. She had NSVT and was placed on amiodarone which she did not tolerate well but manages with 100 mg daily. She has not had angina. Her left cath demonstrated no CAD. Her ef by echo was 35-40%. She is on coreg and amiodarone. She had been intolerant to an ACE inhibitor Allergies  Allergen Reactions  . Penicillins Anaphylaxis    Has patient had a PCN reaction causing immediate rash, facial/tongue/throat swelling, SOB or lightheadedness with hypotension: Yes Has patient had a PCN reaction causing severe rash involving mucus membranes or skin necrosis: No Has patient had a PCN reaction that required hospitalization: No Has patient had a PCN reaction occurring within the last 10 years: No If all of the above answers are "NO", then may proceed with Cephalosporin use.   Tanya Hogan Hcl] Other (See Comments)    Hyperkalemia  . Atorvastatin Other (See Comments)    msucle aches  . Celebrex [Celecoxib]     ulcers  . Fluticasone Other (See Comments)    Nasal ulcer.    . Levocetirizine     Doesn't recall  . Losartan Potassium Itching  . Naproxen Swelling  . Nasonex [Mometasone Furoate] Other (See Comments)    nosebleed  . Vesicare [Solifenacin] Other (See Comments)    "stopped me from urinating"  . Aspirin Other (See Comments)    Pt states "burns holes in stomach", ulcers   . Baking Soda-Fluoride [Sodium Fluoride] Itching  . Clindamycin/Lincomycin Rash  . Codeine Nausea And Vomiting  . Furosemide Other (See Comments)    Nose bleed  . Lactose   . Lactose Intolerance (Gi) Other (See Comments)     Increased calcium, sneezing, nasal issues  . Lanolin-Petrolatum Rash  . Metformin And Related Diarrhea  . Miralax [Polyethylene Glycol 3350]   . Miralax [Polyethylene Glycol] Rash  . Nasonex [Mometasone Furoate] Other (See Comments)  . Quinine Rash  . Rifampin Other (See Comments)    DOES NOT REMEMBER THIS MEDICATION OR ALLERGY  . Sulfa Antibiotics Rash  . Sulfonamide Derivatives Rash  . Tramadol Rash    Anxiety  . Wellbutrin [Bupropion] Other (See Comments)    Muscle aches, pain, "made my skin crawl"  . Wool Alcohol [Lanolin] Rash  . Zoledronic Acid Other (See Comments) and Rash    shaking     Current Outpatient Medications  Medication Sig Dispense Refill  . Accu-Chek FastClix Lancets MISC Apply topically.    [name] alendronate (FOSAMAX) 70 MG tablet Take 1 tablet (70 mg total) by mouth every 7 (seven) days. Take with a full glass of water on an empty stomach. 12 tablet 4  . AMBULATORY NON FORMULARY MEDICATION Medication Name: Tubing and supplies for nebulizer. Fax - [phone] 1 vial 0  . amiodarone (PACERONE) 200 MG tablet Take 0.5 tablets (100 mg total) by mouth daily. 45 tablet 3  . blood glucose meter kit and supplies KIT Dispense based on patient and insurance preference. 1 each 0  . bumetanide (BUMEX) 1 MG tablet TAKE 1 TABLET BY MOUTH  TWICE DAILY 180 tablet 3  . cetirizine (ZYRTEC)  10 MG tablet Take 10 mg by mouth daily.    [name] dicyclomine (BENTYL) 20 MG tablet Take by mouth.    . diltiazem (CARDIZEM CD) 180 MG 24 hr capsule TAKE 1 CAPSULE BY MOUTH  DAILY 90 capsule 3  . EPIPEN 2-PAK 0.3 MG/0.3ML SOAJ injection INJECT 1 PEN INTO MUSCLE OF LATERAL THIGH FOR SEVERE ALLERGIC REACTION. CALL 911 AFTER USING THIS MEDICATION. 1 each 2  . fexofenadine-pseudoephedrine (ALLEGRA-D 24) 180-240 MG 24 hr tablet Take 1 tablet by mouth daily.    . fluticasone-salmeterol (ADVAIR HFA) 115-21 MCG/ACT inhaler Inhale 2 puffs into the lungs 2 (two) times daily.    [name] glucose blood (ACCU-CHEK  GUIDE) test strip Dx DM E11.21 - Check fasting blood sugar every morning before breakfast and before lunch and dinner. 3 times daily 900 each prn  . insulin glargine (LANTUS SOLOSTAR) 100 UNIT/ML Solostar Pen INJECT SUBCUTANEOUSLY 20UNITS AT BEDTIME (Patient taking differently: Inject 22 Units into the skin at bedtime. INJECT SUBCUTANEOUSLY 20UNITS AT BEDTIME) 30 mL 3  . insulin lispro (HUMALOG KWIKPEN) 100 UNIT/ML KwikPen Inject 3-12 Units into the skin 3 (three) times daily as needed (hyperglycemia>150). Sliding scale. Administer subQ within 15 minutes before or immediately after a meal 9 mL 2  . Insulin Pen Needle 31G X 8 MM MISC To be used for injecting insulin Dx: E11.8 300 each 4  . Lancets Misc. (ACCU-CHEK FASTCLIX LANCET) KIT E11.21 Dx DM Check fasting blood sugar every morning before breakfast and before lunch and dinner. 3 times daily 300 kit prn  . levothyroxine (SYNTHROID) 75 MCG tablet TAKE 1 TABLET BY MOUTH  DAILY 90 tablet 3  . Multiple Vitamins-Minerals (OCUVITE EYE + MULTI) TABS Take 1 tablet by mouth daily.    . Olopatadine HCl 0.2 % SOLN Place 1 drop into both eyes daily.    . pantoprazole (PROTONIX) 40 MG tablet Take 40 mg by mouth daily.    [name] Plecanatide (TRULANCE) 3 MG TABS Take 1 tablet by mouth daily.    . pravastatin (PRAVACHOL) 40 MG tablet TAKE 1 TABLET BY MOUTH AT  BEDTIME 90 tablet 3  . PROAIR HFA 108 (90 Base) MCG/ACT inhaler USE 1 TO 2 INHALATIONS BY  MOUTH EVERY 6 HOURS AS  NEEDED FOR WHEEZING OR  SHORTNESS OF BREATH 34 g 3  . RESTASIS 0.05 % ophthalmic emulsion     . Tiotropium Bromide Monohydrate 2.5 MCG/ACT AERS Inhale 2 puffs into the lungs daily.    . valACYclovir (VALTREX) 1000 MG tablet TAKE 1 TABLET BY MOUTH TWICE DAILY 14 tablet 2   No current facility-administered medications for this visit.     Past Medical History:  Diagnosis Date  . Alkaline phosphatase elevation   . Asthma   . Breast cancer ([place]) 2011  . Carpal tunnel syndrome   . Carpal tunnel  syndrome    bilateral  . CHF (congestive heart failure) ([place])   . Cirrhosis, non-alcoholic ([place])   . Closed fracture of unspecified part of radius (alone)   . DDD (degenerative disc disease), lumbar   . Depression    pt denies  . Diabetes mellitus without complication ([place])   . Enlarged heart   . Fibromyalgia   . GERD (gastroesophageal reflux disease)   . History of kidney stones   . HTN (hypertension)   . Hypothyroidism   . IBS (irritable bowel syndrome)   . LBBB (left bundle branch block)   . Microcytic anemia 05/14/2017  . Pneumonia   . Presence of  permanent cardiac pacemaker   . PVD (peripheral vascular disease) ([place])    2019  . Sleep apnea    uses cpap    ROS:   All systems reviewed and negative except as noted in the HPI.   Past Surgical History:  Procedure Laterality Date  . BREAST LUMPECTOMY Left 2011  . COLONOSCOPY    . FOOT SURGERY    . KNEE SURGERY    . MASTECTOMY Right    05/2019  . PACEMAKER GENERATOR CHANGE N/A 08/23/2014   Procedure: PACEMAKER GENERATOR CHANGE;  Surgeon: Evans Lance, MD;  Location: [facility] C[facility] LAB;  Service: Cardiovascular;  Laterality: N/A;  . PACEMAKER PLACEMENT  2009   [facility]  . RECTAL SURGERY  1976  . REDUCTION MAMMAPLASTY Left    05/2019  . TOTAL SHOULDER ARTHROPLASTY Right 03/10/2018  . TOTAL SHOULDER ARTHROPLASTY Right 03/10/2018   Procedure: RIGHT TOTAL SHOULDER ARTHROPLASTY;  Surgeon: [name], MD;  Location: [place];  Service: Orthopedics;  Laterality: Right;  . TUBAL LIGATION       Family History  Problem Relation Age of Onset  . Melanoma Mother   . Parkinson's disease Mother   . Heart failure Other   . Breast cancer Other   . Hypertension Other      Social History   Socioeconomic History  . Marital status: Divorced    Spouse name: Not on file  . Number of children: 2  . Years of education: 99  . Highest education level: Associate degree: academic program  Occupational History  .  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 Smoking status: Former Smoker    Packs/day: 0.00    Years: 0.00    Pack years: 0.00    Quit date: 08/24/1966    Years since quitting: 54.3  . Smokeless tobacco: Never Used  Vaping Use  . Vaping Use: Never used  Substance and Sexual Activity  . Alcohol use: No  . Drug use: No  . Sexual activity: Not Currently  Other Topics Concern  . Not on file  Social History Narrative   Retired. 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 Likes Systems developer and music. Loves animals.   Social Determinants of Health   Financial Resource Strain: Not on file  Food Insecurity: Not on file  Transportation Needs: No Transportation Needs  . Lack of Transportation (Medical): No  . Lack of Transportation (Non-Medical): No  Physical Activity: Not on file  Stress: Not on file  Social Connections: Not on file  Intimate Partner Violence: Unknown  . Fear of Current or Ex-Partner: No  . Emotionally Abused: Not on file  . Physically Abused: Not on file  . Sexually Abused: Not on file     BP 122/74   Pulse 82   Ht 5' 2"  (1.575 m)   Wt 216 lb 3.2 oz (98.1 kg)   SpO2 94%   BMI 39.54 kg/m   Physical Exam:  obese appearing NAD HEENT: Unremarkable Neck:  No JVD, no thyromegally Lymphatics:  No adenopathy Back:  No CVA tenderness Lungs:  Clear with no wheezes HEART:  Regular rate rhythm, no murmurs, no rubs, no clicks Abd:  soft, positive bowel sounds, no organomegally, no rebound, no guarding Ext:  2 plus pulses, no edema, no cyanosis, no clubbing Skin:  No rashes no nodules Neuro:  CN II through XII intact, motor grossly intact  EKG - NSR with biv pacing  DEVICE  Normal  device function.  See PaceArt for details.   Assess/Plan: 1.Chronic systolic heart failure - I encouraged her to continue her current meds. She appears fairly euvolemic today  2. Obesity - she is encouraged to lose weight.She has gained  3 lbs since her last visit. 3. PVC's/NSVT - she will continue her dose of amiodarone 100 mg daily.   4. HTN -her bp is controlled. She will maintain a low sodium diet.  Mikle Bosworth.D

## 2020-11-29 NOTE — Patient Instructions (Signed)
Medication Instructions:  Your physician recommends that you continue on your current medications as directed. Please refer to the Current Medication list given to you today.  Labwork: None ordered.  Testing/Procedures: None ordered.  Follow-Up: Your physician wants you to follow-up in: one year with Cristopher Peru, MD or one of the following Advanced Practice Providers on your designated Care Team:    Chanetta Marshall, NP  Tommye Standard, PA-C  Legrand Como "Jonni Sanger" [place], [place]  Remote monitoring is used to monitor your Pacemaker from home. This monitoring reduces the number of office visits required to check your device to one time per year. It allows [place] to keep an eye on the functioning of your device to ensure it is working properly. You are scheduled for a device check from home on 12/30/2020. You may send your transmission at any time that day. If you have a wireless device, the transmission will be sent automatically. After your physician reviews your transmission, you will receive a postcard with your next transmission date.  Any Other Special Instructions Will Be Listed Below (If Applicable).  If you need a refill on your cardiac medications before your next appointment, please call your pharmacy.

## 2020-12-04 DIAGNOSIS — Z853 Personal history of malignant neoplasm of breast: Secondary | ICD-10-CM | POA: Diagnosis not present

## 2020-12-04 DIAGNOSIS — J849 Interstitial pulmonary disease, unspecified: Secondary | ICD-10-CM | POA: Diagnosis not present

## 2020-12-04 DIAGNOSIS — I517 Cardiomegaly: Secondary | ICD-10-CM | POA: Diagnosis not present

## 2020-12-04 DIAGNOSIS — I700 Atherosclerosis of aorta: Secondary | ICD-10-CM | POA: Diagnosis not present

## 2020-12-04 DIAGNOSIS — R590 Localized enlarged lymph nodes: Secondary | ICD-10-CM | POA: Diagnosis not present

## 2020-12-10 ENCOUNTER — Telehealth: Payer: Self-pay

## 2020-12-10 NOTE — Telephone Encounter (Signed)
OK please add to intolerance list.

## 2020-12-10 NOTE — Telephone Encounter (Signed)
[name] called and states the Laurin Coder is causing her to have back/kidney pain and a feeling of dehydration. She states she even tried to take it every other day. She states she is going to stop taking the medication.

## 2020-12-11 NOTE — Telephone Encounter (Signed)
Added to allergy list.

## 2020-12-30 ENCOUNTER — Ambulatory Visit (INDEPENDENT_AMBULATORY_CARE_PROVIDER_SITE_OTHER): Payer: Medicare Other

## 2020-12-30 DIAGNOSIS — I442 Atrioventricular block, complete: Secondary | ICD-10-CM | POA: Diagnosis not present

## 2020-12-31 ENCOUNTER — Other Ambulatory Visit: Payer: Self-pay | Admitting: Family Medicine

## 2020-12-31 DIAGNOSIS — C50911 Malignant neoplasm of unspecified site of right female breast: Secondary | ICD-10-CM | POA: Diagnosis not present

## 2020-12-31 LAB — CUP PACEART REMOTE DEVICE CHECK
Battery Remaining Longevity: 17 mo
Battery Voltage: 2.9 V
Brady Statistic AP VP Percent: 10.67 %
Brady Statistic AP VS Percent: 0.02 %
Brady Statistic AS VP Percent: 88.34 %
Brady Statistic AS VS Percent: 0.97 %
Brady Statistic RA Percent Paced: 10.55 %
Brady Statistic RV Percent Paced: 98.11 %
Date Time Interrogation Session: 20220509054959
Implantable Lead Implant Date: 20030324
Implantable Lead Implant Date: 20030324
Implantable Lead Implant Date: 20030324
Implantable Lead Location: 753858
Implantable Lead Location: 753859
Implantable Lead Location: 753860
Implantable Lead Model: 4068
Implantable Lead Model: 4068
Implantable Lead Model: 4193
Implantable Pulse Generator Implant Date: 20151231
Lead Channel Impedance Value: 1026 Ohm
Lead Channel Impedance Value: 399 Ohm
Lead Channel Impedance Value: 399 Ohm
Lead Channel Impedance Value: 399 Ohm
Lead Channel Impedance Value: 4047 Ohm
Lead Channel Impedance Value: 4047 Ohm
Lead Channel Impedance Value: 4047 Ohm
Lead Channel Impedance Value: 551 Ohm
Lead Channel Impedance Value: 836 Ohm
Lead Channel Pacing Threshold Amplitude: 1 V
Lead Channel Pacing Threshold Amplitude: 2.125 V
Lead Channel Pacing Threshold Pulse Width: 0.4 ms
Lead Channel Pacing Threshold Pulse Width: 0.4 ms
Lead Channel Sensing Intrinsic Amplitude: 1.875 mV
Lead Channel Sensing Intrinsic Amplitude: 1.875 mV
Lead Channel Sensing Intrinsic Amplitude: 6.875 mV
Lead Channel Sensing Intrinsic Amplitude: 6.875 mV
Lead Channel Setting Pacing Amplitude: 1.5 V
Lead Channel Setting Pacing Amplitude: 2.5 V
Lead Channel Setting Pacing Amplitude: 3.5 V
Lead Channel Setting Pacing Pulse Width: 0.8 ms
Lead Channel Setting Pacing Pulse Width: 0.8 ms
Lead Channel Setting Sensing Sensitivity: 4 mV

## 2021-01-08 ENCOUNTER — Other Ambulatory Visit: Payer: Self-pay

## 2021-01-08 MED ORDER — PRAVASTATIN SODIUM 40 MG PO TABS: 40.0000 mg | ORAL_TABLET | Freq: Every day | ORAL | 2 refills | Status: DC

## 2021-01-09 DIAGNOSIS — G4737 Central sleep apnea in conditions classified elsewhere: Secondary | ICD-10-CM | POA: Diagnosis not present

## 2021-01-09 DIAGNOSIS — G4733 Obstructive sleep apnea (adult) (pediatric): Secondary | ICD-10-CM | POA: Diagnosis not present

## 2021-01-14 ENCOUNTER — Other Ambulatory Visit: Payer: Self-pay

## 2021-01-14 ENCOUNTER — Ambulatory Visit (INDEPENDENT_AMBULATORY_CARE_PROVIDER_SITE_OTHER): Payer: Medicare Other | Admitting: Family Medicine

## 2021-01-14 ENCOUNTER — Encounter: Payer: Self-pay | Admitting: Family Medicine

## 2021-01-14 VITALS — BP 131/60 | HR 79 | Ht 62.0 in | Wt 216.0 lb

## 2021-01-14 DIAGNOSIS — I10 Essential (primary) hypertension: Secondary | ICD-10-CM | POA: Diagnosis not present

## 2021-01-14 DIAGNOSIS — M1712 Unilateral primary osteoarthritis, left knee: Secondary | ICD-10-CM | POA: Diagnosis not present

## 2021-01-14 DIAGNOSIS — E118 Type 2 diabetes mellitus with unspecified complications: Secondary | ICD-10-CM | POA: Diagnosis not present

## 2021-01-14 DIAGNOSIS — K5909 Other constipation: Secondary | ICD-10-CM

## 2021-01-14 DIAGNOSIS — I5022 Chronic systolic (congestive) heart failure: Secondary | ICD-10-CM | POA: Diagnosis not present

## 2021-01-14 DIAGNOSIS — M170 Bilateral primary osteoarthritis of knee: Secondary | ICD-10-CM | POA: Insufficient documentation

## 2021-01-14 LAB — POCT GLYCOSYLATED HEMOGLOBIN (HGB A1C): HbA1c, POC (controlled diabetic range): 6.2 % (ref 0.0–7.0)

## 2021-01-14 NOTE — Assessment & Plan Note (Signed)
Back on Linzess but she would like to be able to take the lower dose tabs.  She will speak to her GI about this.

## 2021-01-14 NOTE — Assessment & Plan Note (Signed)
Well controlled. Continue current regimen. Follow up in  3 months.

## 2021-01-14 NOTE — Progress Notes (Signed)
Established Patient Office Visit  Subjective:  Patient ID: Tanya Hogan, female    DOB: 1950/11/12  Age: 69 y.o. MRN: [id number]  CC:  Chief Complaint  Patient presents with  . Diabetes  . Hypertension    HPI Tanya Hogan presents for   Diabetes - no hypoglycemic events. No wounds or sores that are not healing well. No increased thirst or urination. Checking glucose at home. Taking medications as prescribed without any side effects.  She says not long ago she was actually switched to Trulance but was not doing as well with it so actually went back to the [place] about 2 months ago.  She feels like it works better but sometimes can be a little too powerful she is actually like to go to the lower dose and says she plans on speaking with her GI doctor about that.  The Trulance was causing some nausea.  She says her Carbon has been off a little bit.  He denies any recent lower extremity swelling or chest pain.  He has been still having some problems with her left knee she knows she has a lot of arthritis and it.  She says the last time she got a steroid shot it only worked for about 2 weeks.  She also wanted to let me know that she tried the bladder medicine for overactive bladder and did not like how it made her feel so she stopped it.     Past Medical History:  Diagnosis Date  . Alkaline phosphatase elevation   . Asthma   . Breast cancer ([place]) 2011  . Carpal tunnel syndrome   . Carpal tunnel syndrome    bilateral  . CHF (congestive heart failure) ([place])   . Cirrhosis, non-alcoholic ([place])   . Closed fracture of unspecified part of radius (alone)   . DDD (degenerative disc disease), lumbar   . Depression    pt denies  . Diabetes mellitus without complication ([place])   . Enlarged heart   . Fibromyalgia   . GERD (gastroesophageal reflux disease)   . History of kidney stones   . HTN (hypertension)   . Hypothyroidism   . IBS (irritable bowel syndrome)   . LBBB (left  bundle branch block)   . Microcytic anemia 05/14/2017  . Pneumonia   . Presence of permanent cardiac pacemaker   . PVD (peripheral vascular disease) ([place])    2019  . Sleep apnea    uses cpap    Past Surgical History:  Procedure Laterality Date  . BREAST LUMPECTOMY Left 2011  . COLONOSCOPY    . FOOT SURGERY    . KNEE SURGERY    . MASTECTOMY Right    05/2019  . PACEMAKER GENERATOR CHANGE N/A 08/23/2014   Procedure: PACEMAKER GENERATOR CHANGE;  Surgeon: Evans Lance, MD;  Location: [facility] CATH LAB;  Service: Cardiovascular;  Laterality: N/A;  . PACEMAKER PLACEMENT  2009   [facility]  . RECTAL SURGERY  1976  . REDUCTION MAMMAPLASTY Left    05/2019  . TOTAL SHOULDER ARTHROPLASTY Right 03/10/2018  . TOTAL SHOULDER ARTHROPLASTY Right 03/10/2018   Procedure: RIGHT TOTAL SHOULDER ARTHROPLASTY;  Surgeon: [name], MD;  Location: [facility];  Service: Orthopedics;  Laterality: Right;  . TUBAL LIGATION      Family History  Problem Relation Age of Onset  . Melanoma Mother   . Parkinson's disease Mother   . Heart failure Other   . Breast cancer Other   . Hypertension  Other     Social History   Socioeconomic History  . Marital status: Divorced    Spouse name: Not on file  . Number of children: 2  . Years of education: 104  . Highest education level: Associate degree: academic program  Occupational History  . 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 Smoking status: Former Smoker    Packs/day: 0.00    Years: 0.00    Pack years: 0.00    Quit date: 08/24/1966    Years since quitting: 54.4  . Smokeless tobacco: Never Used  Vaping Use  . Vaping Use: Never used  Substance and Sexual Activity  . Alcohol use: No  . Drug use: No  . Sexual activity: Not Currently  Other Topics Concern  . Not on file  Social History Narrative   Retired. 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 Likes Systems developer and music. Loves animals.    Social Determinants of Health   Financial Resource Strain: Not on file  Food Insecurity: Not on file  Transportation Needs: No Transportation Needs  . Lack of Transportation (Medical): No  . Lack of Transportation (Non-Medical): No  Physical Activity: Not on file  Stress: Not on file  Social Connections: Not on file  Intimate Partner Violence: Unknown  . Fear of Current or Ex-Partner: No  . Emotionally Abused: Not on file  . Physically Abused: Not on file  . Sexually Abused: Not on file    Outpatient Medications Prior to Visit  Medication Sig Dispense Refill  . Accu-Chek FastClix Lancets MISC Apply topically.    [name] alendronate (FOSAMAX) 70 MG tablet Take 1 tablet (70 mg total) by mouth every 7 (seven) days. Take with a full glass of water on an empty stomach. 12 tablet 4  . AMBULATORY NON FORMULARY MEDICATION Medication Name: Tubing and supplies for nebulizer. Fax - [phone] 1 vial 0  . amiodarone (PACERONE) 200 MG tablet Take 0.5 tablets (100 mg total) by mouth daily. 45 tablet 3  . blood glucose meter kit and supplies KIT Dispense based on patient and insurance preference. 1 each 0  . bumetanide (BUMEX) 1 MG tablet TAKE 1 TABLET BY MOUTH  TWICE DAILY 180 tablet 3  . dicyclomine (BENTYL) 20 MG tablet Take by mouth.    . diltiazem (CARDIZEM CD) 180 MG 24 hr capsule TAKE 1 CAPSULE BY MOUTH  DAILY 90 capsule 3  . EPIPEN 2-PAK 0.3 MG/0.3ML SOAJ injection INJECT 1 PEN INTO MUSCLE OF LATERAL THIGH FOR SEVERE ALLERGIC REACTION. CALL 911 AFTER USING THIS MEDICATION. 1 each 2  . fexofenadine-pseudoephedrine (ALLEGRA-D 24) 180-240 MG 24 hr tablet Take 1 tablet by mouth daily.    [name] glucose blood (ACCU-CHEK GUIDE) test strip Dx DM E11.21 - Check fasting blood sugar every morning before breakfast and before lunch and dinner. 3 times daily 900 each prn  . insulin glargine (LANTUS SOLOSTAR) 100 UNIT/ML Solostar Pen Inject 40 Units into the skin at bedtime. 45 mL 1  . insulin lispro (HUMALOG  KWIKPEN) 100 UNIT/ML KwikPen Inject 3-12 Units into the skin 3 (three) times daily as needed (hyperglycemia>150). Sliding scale. Administer subQ within 15 minutes before or immediately after a meal 9 mL 2  . Insulin Pen Needle 31G X 8 MM MISC To be used for injecting insulin Dx: E11.8 300 each 4  . Lancets Misc. (ACCU-CHEK FASTCLIX LANCET) KIT E11.21 Dx DM Check fasting blood sugar every morning before breakfast and before lunch  and dinner. 3 times daily 300 kit prn  . levothyroxine (SYNTHROID) 75 MCG tablet TAKE 1 TABLET BY MOUTH  DAILY 90 tablet 3  . LINZESS 145 MCG CAPS capsule Take 145 mcg by mouth daily.  4  . Multiple Vitamins-Minerals (OCUVITE EYE + MULTI) TABS Take 1 tablet by mouth daily.    . Olopatadine HCl 0.2 % SOLN Place 1 drop into both eyes daily.    . pravastatin (PRAVACHOL) 40 MG tablet Take 1 tablet (40 mg total) by mouth at bedtime. 90 tablet 2  . PROAIR HFA 108 (90 Base) MCG/ACT inhaler USE 1 TO 2 INHALATIONS BY  MOUTH EVERY 6 HOURS AS  NEEDED FOR WHEEZING OR  SHORTNESS OF BREATH 34 g 3  . RESTASIS 0.05 % ophthalmic emulsion     . Tiotropium Bromide Monohydrate 2.5 MCG/ACT AERS Inhale 2 puffs into the lungs daily.    . valACYclovir (VALTREX) 1000 MG tablet TAKE 1 TABLET BY MOUTH TWICE DAILY 14 tablet 2  . fluticasone-salmeterol (ADVAIR HFA) 115-21 MCG/ACT inhaler Inhale 2 puffs into the lungs 2 (two) times daily.    . pantoprazole (PROTONIX) 40 MG tablet Take 40 mg by mouth daily. (Patient not taking: Reported on 01/14/2021)    . cetirizine (ZYRTEC) 10 MG tablet Take 10 mg by mouth daily.    [name] Plecanatide (TRULANCE) 3 MG TABS Take 1 tablet by mouth daily.     No facility-administered medications prior to visit.    Allergies  Allergen Reactions  . Penicillins Anaphylaxis    Has patient had a PCN reaction causing immediate rash, facial/tongue/throat swelling, SOB or lightheadedness with hypotension: Yes Has patient had a PCN reaction causing severe rash involving mucus  membranes or skin necrosis: No Has patient had a PCN reaction that required hospitalization: No Has patient had a PCN reaction occurring within the last 10 years: No If all of the above answers are "NO", then may proceed with Cephalosporin use.   [name] Hcl] Other (See Comments)    Hyperkalemia  . Atorvastatin Other (See Comments)    msucle aches  . Celebrex [Celecoxib]     ulcers  . Fluticasone Other (See Comments)    Nasal ulcer.    . Levocetirizine     Doesn't recall  . Losartan Potassium Itching  . Naproxen Swelling  . Nasonex [Mometasone Furoate] Other (See Comments)    nosebleed  . Vesicare [Solifenacin] Other (See Comments)    "stopped me from urinating"  . Aspirin Other (See Comments)    Pt states "burns holes in stomach", ulcers   . Baking Soda-Fluoride [Sodium Fluoride] Itching  . Clindamycin/Lincomycin Rash  . Codeine Nausea And Vomiting  . Furosemide Other (See Comments)    Nose bleed  . Lactose   . Lactose Intolerance (Gi) Other (See Comments)    Increased calcium, sneezing, nasal issues  . Lanolin-Petrolatum Rash  . Metformin And Related Diarrhea  . Miralax [Polyethylene Glycol 3350]   . Miralax [Polyethylene Glycol] Rash  . Nasonex [Mometasone Furoate] Other (See Comments)  . Quinine Rash  . Rifampin Other (See Comments)    DOES NOT REMEMBER THIS MEDICATION OR ALLERGY  . Sulfa Antibiotics Rash  . Sulfonamide Derivatives Rash  . Tramadol Rash    Anxiety  . Wellbutrin [Bupropion] Other (See Comments)    Muscle aches, pain, "made my skin crawl"  . Wool Alcohol [Lanolin] Rash  . Zoledronic Acid Other (See Comments) and Rash    shaking    ROS Review of  Systems    Objective:    Physical Exam Constitutional:      Appearance: She is well-developed.  HENT:     Head: Normocephalic and atraumatic.  Cardiovascular:     Rate and Rhythm: Normal rate and regular rhythm.     Heart sounds: Normal heart sounds.  Pulmonary:     Effort:  Pulmonary effort is normal.     Breath sounds: Normal breath sounds.  Skin:    General: Skin is warm and dry.  Neurological:     Mental Status: She is alert and oriented to person, place, and time.  Psychiatric:        Behavior: Behavior normal.     BP 131/60   Pulse 79   Ht 5' 2"  (1.575 m)   Wt 216 lb (98 kg)   SpO2 100%   BMI 39.51 kg/m  Wt Readings from Last 3 Encounters:  01/14/21 216 lb (98 kg)  11/29/20 216 lb 3.2 oz (98.1 kg)  10/23/20 214 lb (97.1 kg)     Health Maintenance Due  Topic Date Due  . COVID-19 Vaccine (3 - Moderna risk 4-dose series) 12/27/2019    There are no preventive care reminders to display for this patient.  Lab Results  Component Value Date   TSH 1.27 01/09/2020   Lab Results  Component Value Date   WBC 6.5 03/14/2019   HGB 14.1 03/14/2019   HCT 42.7 03/14/2019   MCV 92.0 03/14/2019   PLT 287 03/14/2019   Lab Results  Component Value Date   NA 140 03/26/2020   K 4.8 03/26/2020   CHLORIDE 105 10/30/2014   CO2 27 03/26/2020   GLUCOSE 123 (H) 03/26/2020   BUN 19 03/26/2020   CREATININE 0.89 03/26/2020   BILITOT 0.4 03/26/2020   ALKPHOS 100 03/02/2018   AST 20 03/26/2020   ALT 16 03/26/2020   PROT 6.6 03/26/2020   ALBUMIN 4.0 03/02/2018   CALCIUM 9.9 03/26/2020   ANIONGAP 8 03/11/2018   EGFR 78 (L) 10/30/2014   Lab Results  Component Value Date   CHOL 194 03/26/2020   Lab Results  Component Value Date   HDL 67 03/26/2020   Lab Results  Component Value Date   LDLCALC 99 03/26/2020   Lab Results  Component Value Date   TRIG 181 (H) 03/26/2020   Lab Results  Component Value Date   CHOLHDL 2.9 03/26/2020   Lab Results  Component Value Date   HGBA1C 6.2 01/14/2021      Assessment & Plan:   Problem List Items Addressed This Visit      Cardiovascular and Mediastinum   Chronic systolic heart failure (HCC)    No sign of volume overload.  She is intolerant to ACEs and ARB's.  And she has been intolerant to  previous trials of beta-blockers..  She does take Bumex.  Stable.      Benign essential hypertension    Well controlled. Continue current regimen. Follow up in  3 mo .         Digestive   Chronic constipation    Back on Linzess but she would like to be able to take the lower dose tabs.  She will speak to her GI about this.        Endocrine   Controlled diabetes mellitus type 2 with complications ([place]) - Primary    Well controlled. Continue current regimen. Follow up in  3 months.       Relevant Orders   POCT  glycosylated hemoglobin (Hb A1C) (Completed)     Musculoskeletal and Integument   Primary osteoarthritis of left knee    Reports that last steroid injection really only lasted about 2 weeks.  Discussed getting back in with Ortho if she feels like it is getting worse over time.  She might benefit from a supportive brace.  She did say putting inserts in sure her shoes have actually been helpful as well.         No orders of the defined types were placed in this encounter.   Follow-up: Return in about 3 months (around 04/16/2021) for Diabetes follow-up.   I spent 35 minutes on the day of the encounter to include pre-visit record review, face-to-face time with the patient and post visit ordering of test.   Beatrice Lecher, MD

## 2021-01-14 NOTE — Assessment & Plan Note (Signed)
Reports that last steroid injection really only lasted about 2 weeks.  Discussed getting back in with Ortho if she feels like it is getting worse over time.  She might benefit from a supportive brace.  She did say putting inserts in sure her shoes have actually been helpful as well.

## 2021-01-14 NOTE — Assessment & Plan Note (Signed)
No sign of volume overload.  She is intolerant to ACEs and ARB's.  And she has been intolerant to previous trials of beta-blockers..  She does take Bumex.  Stable.

## 2021-01-14 NOTE — Assessment & Plan Note (Signed)
Well controlled. Continue current regimen. Follow up in  3 mo .

## 2021-01-17 NOTE — Progress Notes (Signed)
Remote pacemaker transmission.   

## 2021-01-22 DIAGNOSIS — M79672 Pain in left foot: Secondary | ICD-10-CM | POA: Diagnosis not present

## 2021-01-22 DIAGNOSIS — M79671 Pain in right foot: Secondary | ICD-10-CM | POA: Diagnosis not present

## 2021-01-22 DIAGNOSIS — L602 Onychogryphosis: Secondary | ICD-10-CM | POA: Diagnosis not present

## 2021-01-22 DIAGNOSIS — L84 Corns and callosities: Secondary | ICD-10-CM | POA: Diagnosis not present

## 2021-01-22 DIAGNOSIS — E119 Type 2 diabetes mellitus without complications: Secondary | ICD-10-CM | POA: Diagnosis not present

## 2021-02-04 DIAGNOSIS — K581 Irritable bowel syndrome with constipation: Secondary | ICD-10-CM | POA: Diagnosis not present

## 2021-02-04 DIAGNOSIS — K5900 Constipation, unspecified: Secondary | ICD-10-CM | POA: Diagnosis not present

## 2021-02-20 DIAGNOSIS — R140 Abdominal distension (gaseous): Secondary | ICD-10-CM | POA: Diagnosis not present

## 2021-02-20 DIAGNOSIS — K5909 Other constipation: Secondary | ICD-10-CM | POA: Diagnosis not present

## 2021-02-20 DIAGNOSIS — I10 Essential (primary) hypertension: Secondary | ICD-10-CM | POA: Diagnosis not present

## 2021-02-20 DIAGNOSIS — K581 Irritable bowel syndrome with constipation: Secondary | ICD-10-CM | POA: Diagnosis not present

## 2021-02-20 DIAGNOSIS — K7400 Hepatic fibrosis, unspecified: Secondary | ICD-10-CM | POA: Diagnosis not present

## 2021-02-25 ENCOUNTER — Ambulatory Visit (INDEPENDENT_AMBULATORY_CARE_PROVIDER_SITE_OTHER): Payer: Medicare Other | Admitting: Family Medicine

## 2021-02-25 ENCOUNTER — Other Ambulatory Visit: Payer: Self-pay

## 2021-02-25 DIAGNOSIS — Z780 Asymptomatic menopausal state: Secondary | ICD-10-CM

## 2021-02-25 DIAGNOSIS — Z0000 Encounter for general adult medical examination without abnormal findings: Secondary | ICD-10-CM | POA: Diagnosis not present

## 2021-02-25 NOTE — Progress Notes (Signed)
MEDICARE ANNUAL WELLNESS VISIT  02/25/2021  Subjective:  Tanya Hogan is a 70 y.o. female patient of [name], Rene Kocher, MD who had a Medicare Annual Wellness Visit today. Tanya Hogan is Retired and lives with her brother. she has 2 children. she reports that she is socially active and does interact with friends/family regularly. she is minimally physically active and enjoys making jewelry.  Patient Care Team: Hali Marry, MD as PCP - General (Family Medicine) Marcy Panning, MD (Hematology and Oncology) Stanford Breed Denice Bors, MD as Attending Physician (Cardiology) Evans Lance, MD (Cardiology) Dr. Caprice Kluver (Otolaryngology) Amada Kingfisher, MD as Consulting Physician (Hematology and Oncology) Gerre Pebbles, MD as Referring Physician (Gastroenterology) Haynes Dage, OD as Referring Physician (Optometry)  Advanced Directives 02/25/2021 10/10/2018 06/13/2018 03/10/2018 03/10/2018 10/05/2017 10/30/2014  Does Patient Have a Medical Advance Directive? No No No No No No No  Would patient like information on creating a medical advance directive? No - Patient declined No - Patient declined No - Patient declined No - Patient declined No - Patient declined No - Patient declined No - patient declined information    Hospital Utilization Over the Past 12 Months: # of hospitalizations or ER visits: 1 # of surgeries: 0  Review of Systems    Patient reports that her overall health is worse when compared to last year.  Review of Systems: History obtained from chart review and the patient  All other systems negative.  Pain Assessment Pain : 0-10 Pain Score: 5  Pain Type: Chronic pain Pain Location: Back Pain Orientation: Lower Pain Descriptors / Indicators: Aching Pain Onset: More than a month ago Pain Frequency: Constant Pain Relieving Factors: rest  Pain Relieving Factors: rest  Current Medications & Allergies (verified) Allergies as of 02/25/2021       Reactions   Penicillins  Anaphylaxis   Has patient had a PCN reaction causing immediate rash, facial/tongue/throat swelling, SOB or lightheadedness with hypotension: Yes Has patient had a PCN reaction causing severe rash involving mucus membranes or skin necrosis: No Has patient had a PCN reaction that required hospitalization: No Has patient had a PCN reaction occurring within the last 10 years: No If all of the above answers are "NO", then may proceed with Cephalosporin use.   Accupril [quinapril Hcl] Other (See Comments)   Hyperkalemia   Atorvastatin Other (See Comments)   msucle aches   Celebrex [celecoxib]    ulcers   Fluticasone Other (See Comments)   Nasal ulcer.     Levocetirizine    Doesn't recall   Losartan Potassium Itching   Naproxen Swelling   Nasonex [mometasone Furoate] Other (See Comments)   nosebleed   Chamomile Other (See Comments)   unknown   Vesicare [solifenacin] Other (See Comments)   "stopped me from urinating"   Aspirin Other (See Comments)   Pt states "burns holes in stomach", ulcers    Baking Soda-fluoride [sodium Fluoride] Itching   Clindamycin/lincomycin Rash   Codeine Nausea And Vomiting   Furosemide Other (See Comments)   Nose bleed   Lactose    Lactose Intolerance (gi) Other (See Comments)   Increased calcium, sneezing, nasal issues   Lanolin-petrolatum Rash   Metformin And Related Diarrhea   Miralax [polyethylene Glycol 3350]    Miralax [polyethylene Glycol] Rash   Nasonex [mometasone Furoate] Other (See Comments)   Quinine Rash   Rifampin Other (See Comments)   DOES NOT REMEMBER THIS MEDICATION OR ALLERGY   Sulfa Antibiotics Rash   Sulfonamide Derivatives  Rash   Tramadol Rash   Anxiety   Wellbutrin [bupropion] Other (See Comments)   Muscle aches, pain, "made my skin crawl"   Wool Alcohol [lanolin] Rash   Zoledronic Acid Other (See Comments), Rash   shaking        Medication List        Accurate as of February 25, 2021  9:01 AM. If you have any questions,  ask your nurse or doctor.          Accu-Chek [employer] Kit E11.21 Dx DM Check fasting blood sugar every morning before breakfast and before lunch and dinner. 3 times daily   Accu-Chek FastClix Lancets Misc Apply topically.   Accu-Chek Guide test strip Generic drug: glucose blood Dx DM E11.21 - Check fasting blood sugar every morning before breakfast and before lunch and dinner. 3 times daily   Advair HFA 115-21 MCG/ACT inhaler Generic drug: fluticasone-salmeterol Inhale 2 puffs into the lungs 2 (two) times daily.   alendronate 70 MG tablet Commonly known as: FOSAMAX Take 1 tablet (70 mg total) by mouth every 7 (seven) days. Take with a full glass of water on an empty stomach.   AMBULATORY NON FORMULARY MEDICATION Medication Name: Tubing and supplies for nebulizer. Fax - [phone]   amiodarone 200 MG tablet Commonly known as: PACERONE Take 0.5 tablets (100 mg total) by mouth daily.   blood glucose meter kit and supplies Kit Dispense based on patient and insurance preference.   bumetanide 1 MG tablet Commonly known as: BUMEX TAKE 1 TABLET BY MOUTH  TWICE DAILY   clotrimazole-betamethasone cream Commonly known as: LOTRISONE   dicyclomine 20 MG tablet Commonly known as: BENTYL Take by mouth.   diltiazem 180 MG 24 hr capsule Commonly known as: CARDIZEM CD TAKE 1 CAPSULE BY MOUTH  DAILY   EpiPen 2-Pak 0.3 mg/0.3 mL Soaj injection Generic drug: EPINEPHrine INJECT 1 PEN INTO MUSCLE OF LATERAL THIGH FOR SEVERE ALLERGIC REACTION. CALL 911 AFTER USING THIS MEDICATION.   fexofenadine-pseudoephedrine 180-240 MG 24 hr tablet Commonly known as: ALLEGRA-D 24 Take 1 tablet by mouth daily.   insulin lispro 100 UNIT/ML KwikPen Commonly known as: HumaLOG KwikPen Inject 3-12 Units into the skin 3 (three) times daily as needed (hyperglycemia>150). Sliding scale. Administer subQ within 15 minutes before or immediately after a meal   Insulin Pen Needle 31G X 8 MM  Misc To be used for injecting insulin Dx: E11.8   Lantus SoloStar 100 UNIT/ML Solostar Pen Generic drug: insulin glargine Inject 40 Units into the skin at bedtime. What changed: additional instructions   levothyroxine 75 MCG tablet Commonly known as: SYNTHROID TAKE 1 TABLET BY MOUTH  DAILY   Linzess 145 MCG Caps capsule Generic drug: linaclotide Take 145 mcg by mouth daily.   Linzess 72 MCG capsule Generic drug: linaclotide Take 72 mcg by mouth every morning.   Ocuvite Eye + Multi Tabs Take 1 tablet by mouth daily.   Olopatadine HCl 0.2 % Soln Place 1 drop into both eyes daily.   pantoprazole 40 MG tablet Commonly known as: PROTONIX Take 40 mg by mouth daily.   pravastatin 40 MG tablet Commonly known as: PRAVACHOL Take 1 tablet (40 mg total) by mouth at bedtime.   ProAir HFA 108 (90 Base) MCG/ACT inhaler Generic drug: albuterol USE 1 TO 2 INHALATIONS BY  MOUTH EVERY 6 HOURS AS  NEEDED FOR WHEEZING OR  SHORTNESS OF BREATH   Restasis 0.05 % ophthalmic emulsion Generic drug: cycloSPORINE   Tiotropium Bromide Monohydrate  2.5 MCG/ACT Aers Inhale 2 puffs into the lungs daily.   valACYclovir 1000 MG tablet Commonly known as: VALTREX TAKE 1 TABLET BY MOUTH TWICE DAILY        History (reviewed): Past Medical History:  Diagnosis Date   Alkaline phosphatase elevation    Asthma    Breast cancer ([place]) 2011   Carpal tunnel syndrome    Carpal tunnel syndrome    bilateral   CHF (congestive heart failure) (HCC)    Cirrhosis, non-alcoholic (HCC)    Closed fracture of unspecified part of radius (alone)    DDD (degenerative disc disease), lumbar    Depression    pt denies   Diabetes mellitus without complication ([place])    Enlarged heart    Fibromyalgia    GERD (gastroesophageal reflux disease)    History of kidney stones    HTN (hypertension)    Hypothyroidism    IBS (irritable bowel syndrome)    LBBB (left bundle branch block)    Microcytic anemia 05/14/2017    Pneumonia    Presence of permanent cardiac pacemaker    PVD (peripheral vascular disease) ([place])    2019   Sleep apnea    uses cpap   Past Surgical History:  Procedure Laterality Date   BREAST LUMPECTOMY Left 2011   COLONOSCOPY     FOOT SURGERY     KNEE SURGERY     MASTECTOMY Right    05/2019   PACEMAKER GENERATOR CHANGE N/A 08/23/2014   Procedure: PACEMAKER GENERATOR CHANGE;  Surgeon: Evans Lance, MD;  Location: [facility] CATH LAB;  Service: Cardiovascular;  Laterality: N/A;   PACEMAKER PLACEMENT  2009   [facility]   RECTAL SURGERY  1976   REDUCTION MAMMAPLASTY Left    05/2019   TOTAL SHOULDER ARTHROPLASTY Right 03/10/2018   TOTAL SHOULDER ARTHROPLASTY Right 03/10/2018   Procedure: RIGHT TOTAL SHOULDER ARTHROPLASTY;  Surgeon: [name], MD;  Location: [place];  Service: Orthopedics;  Laterality: Right;   TUBAL LIGATION     Family History  Problem Relation Age of Onset   Heart disease Mother    Melanoma Mother    Parkinson's disease Mother    Heart disease Father    Heart disease Brother    Heart failure Other    Breast cancer Other    Hypertension Other    Social History   Socioeconomic History   Marital status: Divorced    Spouse name: Not on file   Number of children: 2   Years of education: 16   Highest education level: Associate degree: academic program  Occupational History   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 Lives with and helps her brother. Likes Systems developer, Social worker and music. Loves animals.    Social Determinants of Health   Financial Resource Strain: Low Risk    Difficulty of Paying Living Expenses: Not hard at all  Food Insecurity: No Food Insecurity  Worried About Charity fundraiser in the Last Year: Never true   Ballplay in the Last Year: Never true  Transportation Needs: No Transportation Needs   Lack of Transportation (Medical): No   Lack of Transportation (Non-Medical): No  Physical Activity: Inactive   Days of Exercise per Week: 0 days   Minutes of Exercise per Session: 0 min  Stress: No Stress Concern Present   Feeling of Stress : Not at all  Social Connections: Socially Isolated   Frequency of Communication with Friends and Family: More than three times a week   Frequency of Social Gatherings with Friends and Family: Once a week   Attends Religious Services: Never   Marine scientist or Organizations: No   Attends Archivist Meetings: Never   Marital Status: Divorced    Activities of Daily Living In your present state of health, do you have any difficulty performing the following activities: 02/25/2021  Hearing? N  Vision? Y  Difficulty concentrating or making decisions? N  Walking or climbing stairs? Y  Comment Patient doesn't climb stairs.  Dressing or bathing? Y  Comment has difficulty but can get it done.  Doing errands, shopping? N  Preparing Food and eating ? N  Using the Toilet? N  In the past six months, have you accidently leaked urine? Y  Comment she doesn't have stress incontinence.  Do you have problems with loss of bowel control? N  Managing your Medications? N  Managing your Finances? N  Housekeeping or managing your Housekeeping? Y  Comment has difficulty due to her arthritis  Some recent data might be hidden    Patient Education/Literacy How often do you need to have someone help you when you read instructions, pamphlets, or other written materials from your doctor or pharmacy?: 1 - Never What is the last  grade level you completed in school?: 2 Assocaites Degrees  Exercise Current Exercise Habits: Home exercise routine, Type of exercise: walking, Time (Minutes): 30, Frequency (Times/Week): >7, Weekly Exercise (Minutes/Week): 0, Intensity: Mild, Exercise limited by: orthopedic condition(s)  Diet Patient reports consuming  2-4  meals a day and 0 snack(s) a day Patient reports that her primary diet is: Regular Patient reports that she does have regular access to food.   Depression Screen PHQ 2/9 Scores 02/25/2021 01/14/2021 09/16/2020 09/16/2020 01/16/2020 10/10/2018 06/13/2018  PHQ - 2 Score 0 0 0 0 0 0 0  PHQ- 9 Score 0 0 - 0 0 - -     Fall Risk Fall Risk  02/25/2021 01/14/2021 11/06/2019 10/10/2018 06/13/2018  Falls in the past year? 0 1 0 0 No  Number falls in past yr: 0 0 0 - -  Injury with Fall? 0 0 0 - -  Risk Factor Category  - - - - -  Risk for fall due to : Other (Comment);History of fall(s) History of fall(s) No Fall Risks Impaired balance/gait -  Risk for fall due to: Comment patient does have neuropathy but hasn't had any falls. - - states some days is shaky when walks. -  Follow up Falls evaluation completed Falls prevention discussed;Falls evaluation completed - - -     Objective:   BP (!) 122/56 (BP Location: Right Arm, Patient Position: Sitting, Cuff Size: Large)   Pulse 71   Ht 5' 1"  (1.549 m)   Wt 220 lb 1.9 oz (99.8 kg)   SpO2 97%   BMI 41.59 kg/m   Last Weight  Most recent  update: 02/25/2021  8:53 AM    Weight  99.8 kg (220 lb 1.9 oz)             Body mass index is 41.59 kg/m.  Hearing/Vision  Regene did not have difficulty with hearing/understanding during the face-to-face interview Margaurite did not have difficulty with her vision during the face-to-face interview Reports that she has had a formal eye exam by an eye care professional within the past year Reports that she has not had a formal hearing evaluation within the past year  Cognitive Function: 6CIT  Screen 02/25/2021 01/16/2020 10/10/2018 10/05/2017  What Year? 0 points 0 points 0 points 0 points  What month? 0 points 0 points 0 points 0 points  What time? 0 points 0 points 0 points 0 points  Count back from 20 0 points 0 points 0 points 2 points  Months in reverse 2 points 2 points 2 points 2 points  Repeat phrase 2 points 2 points 0 points 6 points  Total Score 4 4 2 10     Normal Cognitive Function Screening: Yes (Normal:0-7, Significant for Dysfunction: >8)  Immunization & Health Maintenance Record Immunization History  Administered Date(s) Administered   Fluad Quad(high Dose 65+) 04/11/2019, 05/02/2020   Influenza Split 05/10/2012, 05/04/2014   Influenza Whole 06/14/2009, 05/08/2010, 04/24/2011   Influenza, High Dose Seasonal PF 05/17/2017, 05/24/2018, 04/11/2019, 05/02/2020   Influenza,inj,Quad PF,6+ Mos 04/28/2013, 05/04/2014, 05/06/2015, 05/18/2016   Moderna Sars-Covid-2 Vaccination 11/01/2019, 11/29/2019   Pneumococcal Conjugate-13 05/25/2014   Pneumococcal Polysaccharide-23 11/28/2012, 12/28/2018   Td 08/24/2004, 03/07/2010   Tdap 07/14/2012   Zoster Recombinat (Shingrix) 10/06/2017, 01/16/2020   Zoster, Live 04/24/2011    Health Maintenance  Topic Date Due   COVID-19 Vaccine (3 - Moderna risk series) 12/27/2019   INFLUENZA VACCINE  03/24/2021   OPHTHALMOLOGY EXAM  05/06/2021   HEMOGLOBIN A1C  07/17/2021   URINE MICROALBUMIN  09/16/2021   FOOT EXAM  01/14/2022   TETANUS/TDAP  07/14/2022   MAMMOGRAM  10/16/2022   COLONOSCOPY (Pts 45-53yr Insurance coverage will need to be confirmed)  04/06/2024   DEXA SCAN  Completed   Hepatitis C Screening  Completed   PNA vac Low Risk Adult  Completed   Zoster Vaccines- Shingrix  Completed   HPV VACCINES  Aged Out       Assessment  This is a routine wellness examination for JRayann Heman  Health Maintenance: Due or Overdue Health Maintenance Due  Topic Date Due   COVID-19 Vaccine (3 - Moderna risk series)  12/27/2019    JRayann Hemandoes not need a referral for Community Assistance: Care Management:   no Social Work:    no Prescription Assistance:  no Nutrition/Diabetes Education:  no   Plan:  Personalized Goals  Goals Addressed               This Visit's Progress     Patient Stated (pt-stated)        02/25/2021 AWV Goal: Exercise for General Health  Patient will verbalize understanding of the benefits of increased physical activity: Exercising regularly is important. It will improve your overall fitness, flexibility, and endurance. Regular exercise also will improve your overall health. It can help you control your weight, reduce stress, and improve your bone density. Over the next year, patient will increase physical activity as tolerated with a goal of at least 150 minutes of moderate physical activity per week.  You can tell that you are exercising at a moderate intensity if your  heart starts beating faster and you start breathing faster but can still hold a conversation. Moderate-intensity exercise ideas include: Walking 1 mile (1.6 km) in about 15 minutes Biking Hiking Golfing Dancing Water aerobics Patient will verbalize understanding of everyday activities that increase physical activity by providing examples like the following: Yard work, such as: Sales promotion account executive Gardening Washing windows or floors Patient will be able to explain general safety guidelines for exercising:  Before you start a new exercise program, talk with your health care provider. Do not exercise so much that you hurt yourself, feel dizzy, or get very short of breath. Wear comfortable clothes and wear shoes with good support. Drink plenty of water while you exercise to prevent dehydration or heat stroke. Work out until your breathing and your heartbeat get faster.         Personalized Health Maintenance &  Screening Recommendations  Bone densitometry screening  Lung Cancer Screening Recommended: no (Low Dose CT Chest recommended if Age 61-80 years, 30 pack-year currently smoking OR have quit w/in past 15 years) Hepatitis C Screening recommended: no HIV Screening recommended: no  Advanced Directives: Written information was not given per the patient's request.  Referrals & Orders Orders Placed This Encounter  Procedures   [place]     Follow-up Plan Follow-up with Hali Marry, MD as planned Referral  for bone density has been sent and they will call you to schedule. Medicare wellness visit in one year. AVS printed and given to patient.    I have personally reviewed and noted the following in the patient's chart:   Medical and social history Use of alcohol, tobacco or illicit drugs  Current medications and supplements Functional ability and status Nutritional status Physical activity Advanced directives List of other physicians Hospitalizations, surgeries, and ER visits in previous 12 months Vitals Screenings to include cognitive, depression, and falls Referrals and appointments  In addition, I have reviewed and discussed with patient certain preventive protocols, quality metrics, and best practice recommendations. A written personalized care plan for preventive services as well as general preventive health recommendations were provided to patient.     Tinnie Gens, RN  02/25/2021

## 2021-02-25 NOTE — Patient Instructions (Addendum)
[facility] Maintenance Summary and Written Plan of Care  Tanya Hogan ,  Thank you for allowing me to perform your Medicare Annual Wellness Visit and for your ongoing commitment to your health.   Health Maintenance & Immunization History Health Maintenance  Topic Date Due   COVID-19 Vaccine (3 - Moderna risk series) 12/27/2019   INFLUENZA VACCINE  03/24/2021   OPHTHALMOLOGY EXAM  05/06/2021   HEMOGLOBIN A1C  07/17/2021   URINE MICROALBUMIN  09/16/2021   FOOT EXAM  01/14/2022   TETANUS/TDAP  07/14/2022   MAMMOGRAM  10/16/2022   COLONOSCOPY (Pts 45-64yr Insurance coverage will need to be confirmed)  04/06/2024   DEXA SCAN  Completed   Hepatitis C Screening  Completed   PNA vac Low Risk Adult  Completed   Zoster Vaccines- Shingrix  Completed   HPV VACCINES  Aged Out   Immunization History  Administered Date(s) Administered   Fluad Quad(high Dose 65+) 04/11/2019, 05/02/2020   Influenza Split 05/10/2012, 05/04/2014   Influenza Whole 06/14/2009, 05/08/2010, 04/24/2011   Influenza, High Dose Seasonal PF 05/17/2017, 05/24/2018, 04/11/2019, 05/02/2020   Influenza,inj,Quad PF,6+ Mos 04/28/2013, 05/04/2014, 05/06/2015, 05/18/2016   Moderna Sars-Covid-2 Vaccination 11/01/2019, 11/29/2019   Pneumococcal Conjugate-13 05/25/2014   Pneumococcal Polysaccharide-23 11/28/2012, 12/28/2018   Td 08/24/2004, 03/07/2010   Tdap 07/14/2012   Zoster Recombinat (Shingrix) 10/06/2017, 01/16/2020   Zoster, Live 04/24/2011    These are the patient goals that we discussed:  Goals Addressed               This Visit's Progress     Patient Stated (pt-stated)        02/25/2021 AWV Goal: Exercise for General Health  Patient will verbalize understanding of the benefits of increased physical activity: Exercising regularly is important. It will improve your overall fitness, flexibility, and endurance. Regular exercise also will improve your overall health. It can help you  control your weight, reduce stress, and improve your bone density. Over the next year, patient will increase physical activity as tolerated with a goal of at least 150 minutes of moderate physical activity per week.  You can tell that you are exercising at a moderate intensity if your heart starts beating faster and you start breathing faster but can still hold a conversation. Moderate-intensity exercise ideas include: Walking 1 mile (1.6 km) in about 15 minutes Biking Hiking Golfing Dancing Water aerobics Patient will verbalize understanding of everyday activities that increase physical activity by providing examples like the following: Yard work, such as: PSales promotion account executiveGardening Washing windows or floors Patient will be able to explain general safety guidelines for exercising:  Before you start a new exercise program, talk with your health care provider. Do not exercise so much that you hurt yourself, feel dizzy, or get very short of breath. Wear comfortable clothes and wear shoes with good support. Drink plenty of water while you exercise to prevent dehydration or heat stroke. Work out until your breathing and your heartbeat get faster.           This is a list of Health Maintenance Items that are overdue or due now: Health Maintenance Due  Topic Date Due   COVID-19 Vaccine (3 - Moderna risk series) 12/27/2019     Orders/Referrals Placed Today: Orders Placed This Encounter  Procedures   DEXAScan    Standing Status:   Future    Standing Expiration Date:  02/25/2022    Scheduling Instructions:     Please call patient to schedule    Order Specific Question:   Reason for exam:    Answer:   Post Menopausal    Order Specific Question:   Preferred imaging location?    Answer:   [facility]   (Contact our referral department at [phone] if you have not spoken with someone  about your referral appointment within the next 5 days)    Follow-up Plan Follow-up with Hali Marry, MD as planned Referral  for bone density has been sent and they will call you to schedule. Medicare wellness visit in one year. AVS printed and given to patient.   Health Maintenance, Female Adopting a healthy lifestyle and getting preventive care are important in promoting health and wellness. Ask your health care provider about: The right schedule for you to have regular tests and exams. Things you can do on your own to prevent diseases and keep yourself healthy. What should I know about diet, weight, and exercise? Eat a healthy diet  Eat a diet that includes plenty of vegetables, fruits, low-fat dairy products, and lean protein. Do not eat a lot of foods that are high in solid fats, added sugars, or sodium.  Maintain a healthy weight Body mass index (BMI) is used to identify weight problems. It estimates body fat based on height and weight. Your health care provider can help determineyour BMI and help you achieve or maintain a healthy weight. Get regular exercise Get regular exercise. This is one of the most important things you can do for your health. Most adults should: Exercise for at least 150 minutes each week. The exercise should increase your heart rate and make you sweat (moderate-intensity exercise). Do strengthening exercises at least twice a week. This is in addition to the moderate-intensity exercise. Spend less time sitting. Even light physical activity can be beneficial. Watch cholesterol and blood lipids Have your blood tested for lipids and cholesterol at 72 years of age, then havethis test every 5 years. Have your cholesterol levels checked more often if: Your lipid or cholesterol levels are high. You are older than 72 years of age. You are at high risk for heart disease. What should I know about cancer screening? Depending on your health history and  family history, you may need to have cancer screening at various ages. This may include screening for: Breast cancer. Cervical cancer. Colorectal cancer. Skin cancer. Lung cancer. What should I know about heart disease, diabetes, and high blood pressure? Blood pressure and heart disease High blood pressure causes heart disease and increases the risk of stroke. This is more likely to develop in people who have high blood pressure readings, are of African descent, or are overweight. Have your blood pressure checked: Every 3-5 years if you are 42-71 years of age. Every year if you are 37 years old or older. Diabetes Have regular diabetes screenings. This checks your fasting blood sugar level. Have the screening done: Once every three years after age 65 if you are at a normal weight and have a low risk for diabetes. More often and at a younger age if you are overweight or have a high risk for diabetes. What should I know about preventing infection? Hepatitis B If you have a higher risk for hepatitis B, you should be screened for this virus. Talk with your health care provider to find out if you are at risk forhepatitis B infection. Hepatitis C Testing is recommended  for: Everyone born from [birth year] through 1967. Anyone with known risk factors for hepatitis C. Sexually transmitted infections (STIs) Get screened for STIs, including gonorrhea and chlamydia, if: You are sexually active and are younger than 72 years of age. You are older than 72 years of age and your health care provider tells you that you are at risk for this type of infection. Your sexual activity has changed since you were last screened, and you are at increased risk for chlamydia or gonorrhea. Ask your health care provider if you are at risk. Ask your health care provider about whether you are at high risk for HIV. Your health care provider may recommend a prescription medicine to help prevent HIV infection. If you choose to take  medicine to prevent HIV, you should first get tested for HIV. You should then be tested every 3 months for as long as you are taking the medicine. Pregnancy If you are about to stop having your period (premenopausal) and you may become pregnant, seek counseling before you get pregnant. Take 400 to 800 micrograms (mcg) of folic acid every day if you become pregnant. Ask for birth control (contraception) if you want to prevent pregnancy. Osteoporosis and menopause Osteoporosis is a disease in which the bones lose minerals and strength with aging. This can result in bone fractures. If you are 95 years old or older, or if you are at risk for osteoporosis and fractures, ask your health care provider if you should: Be screened for bone loss. Take a calcium or vitamin D supplement to lower your risk of fractures. Be given hormone replacement therapy (HRT) to treat symptoms of menopause. Follow these instructions at home: Lifestyle Do not use any products that contain nicotine or tobacco, such as cigarettes, e-cigarettes, and chewing tobacco. If you need help quitting, ask your health care provider. Do not use street drugs. Do not share needles. Ask your health care provider for help if you need support or information about quitting drugs. Alcohol use Do not drink alcohol if: Your health care provider tells you not to drink. You are pregnant, may be pregnant, or are planning to become pregnant. If you drink alcohol: Limit how much you use to 0-1 drink a day. Limit intake if you are breastfeeding. Be aware of how much alcohol is in your drink. In the [place] one drink equals one 12 oz bottle of beer (355 mL), one 5 oz glass of wine (148 mL), or one 1 oz glass of hard liquor (44 mL). General instructions Schedule regular health, dental, and eye exams. Stay current with your vaccines. Tell your health care provider if: You often feel depressed. You have ever been abused or do not feel safe at  home. Summary Adopting a healthy lifestyle and getting preventive care are important in promoting health and wellness. Follow your health care provider's instructions about healthy diet, exercising, and getting tested or screened for diseases. Follow your health care provider's instructions on monitoring your cholesterol and blood pressure. This information is not intended to replace advice given to you by your health care provider. Make sure you discuss any questions you have with your healthcare provider. Document Revised: 08/03/2018 Document Reviewed: 08/03/2018 Elsevier Patient Education  2022 [employer].

## 2021-02-26 ENCOUNTER — Ambulatory Visit (INDEPENDENT_AMBULATORY_CARE_PROVIDER_SITE_OTHER): Payer: Medicare Other

## 2021-02-26 DIAGNOSIS — Z780 Asymptomatic menopausal state: Secondary | ICD-10-CM

## 2021-02-26 DIAGNOSIS — Z0000 Encounter for general adult medical examination without abnormal findings: Secondary | ICD-10-CM

## 2021-02-26 DIAGNOSIS — M810 Age-related osteoporosis without current pathological fracture: Secondary | ICD-10-CM | POA: Diagnosis not present

## 2021-02-28 ENCOUNTER — Encounter: Payer: Self-pay | Admitting: Family Medicine

## 2021-02-28 DIAGNOSIS — M810 Age-related osteoporosis without current pathological fracture: Secondary | ICD-10-CM | POA: Insufficient documentation

## 2021-03-08 ENCOUNTER — Emergency Department
Admission: EM | Admit: 2021-03-08 | Discharge: 2021-03-08 | Disposition: A | Discharge status: Home / Self Care | Payer: Medicare Other | Source: Home / Self Care

## 2021-03-08 ENCOUNTER — Encounter: Payer: Self-pay | Admitting: Emergency Medicine

## 2021-03-08 ENCOUNTER — Other Ambulatory Visit: Payer: Self-pay

## 2021-03-08 DIAGNOSIS — R040 Epistaxis: Secondary | ICD-10-CM

## 2021-03-08 NOTE — ED Provider Notes (Signed)
[name]    CSN: [id number] Arrival date & time: 03/08/21  1204      History   Chief Complaint Chief Complaint  Patient presents with   Epistaxis    HPI Tanya Hogan is a 70 y.o. female.   HPI 70 year old female presents with nose bleed for since awakening this morning and reports 3 significant nosebleeds prior to presentation at our clinic this afternoon.  Patient reports using/inserting tampons into each nare to occlude bleeding 45 minutes prior to arrival (with left nare having more bleeding than right).  PMH significant CHF with enlarged heart, LBBB, presence of permanent cardiac pacemaker, PVD, hypertension, and microcytic anemia.  Past Medical History:  Diagnosis Date   Alkaline phosphatase elevation    Asthma    Breast cancer ([place]) 2011   Carpal tunnel syndrome    Carpal tunnel syndrome    bilateral   CHF (congestive heart failure) ([place])    Cirrhosis, non-alcoholic (HCC)    Closed fracture of unspecified part of radius (alone)    DDD (degenerative disc disease), lumbar    Depression    pt denies   Diabetes mellitus without complication ([place])    Enlarged heart    Fibromyalgia    GERD (gastroesophageal reflux disease)    History of kidney stones    HTN (hypertension)    Hypothyroidism    IBS (irritable bowel syndrome)    LBBB (left bundle branch block)    Microcytic anemia 05/14/2017   Pneumonia    Presence of permanent cardiac pacemaker    PVD (peripheral vascular disease) ([place])    2019   Sleep apnea    uses cpap    Patient Active Problem List   Diagnosis Date Noted   Osteoporosis 02/28/2021   Primary osteoarthritis of left knee 01/14/2021   Irritable bowel syndrome with constipation 09/16/2020   Aortic atherosclerosis ([place]) 06/11/2020   Benign lipomatous neoplasm of skin and subcutaneous tissue of left leg 06/11/2020   Paroxysmal VT ([place]) 02/14/2020   Chronic constipation 10/06/2019   Moderate persistent asthma with exacerbation  08/29/2019   Genetic testing 08/14/2019   Ductal carcinoma in situ (DCIS) of right breast 06/15/2019   Arthralgia 04/11/2019   Malignant tumor of breast ([place]) 03/06/2019   Fatigue 03/06/2019   Status post total shoulder arthroplasty, right 03/10/2018   DDD (degenerative disc disease), cervical 11/19/2017   Bradycardia with less than 60 beats per minute 11/02/2017   Peripheral edema 05/19/2017   Microcytic anemia 05/14/2017   Delayed sleep phase syndrome 10/29/2016   Primary osteoarthritis of left ankle 09/24/2016   Gastrointestinal food sensitivity 05/21/2016   History of arthroplasty of right shoulder 02/24/2016   Bilateral foot pain 01/22/2016   NASH (nonalcoholic steatohepatitis) 05/26/2015    Class: Stage 3   Nasal septal perforation 07/11/2014   Controlled diabetes mellitus type 2 with complications ([place]) 53/29/9242   Physical deconditioning 04/24/2014   Obesity, Class II, BMI 35-39.9, with comorbidity 06/09/2013   OSA (obstructive sleep apnea) 68/34/1962   Chronic systolic heart failure ([place]) 05/23/2013   Asthma, moderate persistent 04/18/2013   Chronic cough 04/18/2013   Breast cancer of upper-inner quadrant of left female breast ([place]) 03/18/2010   Hyperlipidemia 11/07/2009   Fibromyalgia 10/29/2009   Complete heart block ([place]) s/p MDT BiVP 10/23/2009   PACEMAKER, PERMANENT 10/23/2009   ALKALINE PHOSPHATASE, ELEVATED 10/13/2009   Hypothyroidism 10/03/2009   Major depressive disorder 07/02/2009   Benign essential hypertension 07/02/2009   Chronic back pain 07/02/2009  Past Surgical History:  Procedure Laterality Date   BREAST LUMPECTOMY Left 2011   COLONOSCOPY     FOOT SURGERY     KNEE SURGERY     MASTECTOMY Right    05/2019   PACEMAKER GENERATOR CHANGE N/A 08/23/2014   Procedure: PACEMAKER GENERATOR CHANGE;  Surgeon: Evans Lance, MD;  Location: [facility] CATH LAB;  Service: Cardiovascular;  Laterality: N/A;   PACEMAKER PLACEMENT  2009   [facility]    RECTAL SURGERY  1976   REDUCTION MAMMAPLASTY Left    05/2019   TOTAL SHOULDER ARTHROPLASTY Right 03/10/2018   TOTAL SHOULDER ARTHROPLASTY Right 03/10/2018   Procedure: RIGHT TOTAL SHOULDER ARTHROPLASTY;  Surgeon: [name], MD;  Location: [facility];  Service: Orthopedics;  Laterality: Right;   TUBAL LIGATION      OB History   No obstetric history on file.      Home Medications    Prior to Admission medications   Medication Sig Start Date End Date Taking? Authorizing Provider  Accu-Chek FastClix Lancets MISC Apply topically. 03/25/20   [provider]  alendronate (FOSAMAX) 70 MG tablet Take 1 tablet (70 mg total) by mouth every 7 (seven) days. Take with a full glass of water on an empty stomach. 05/08/20   Hali Marry, MD  AMBULATORY NON FORMULARY MEDICATION Medication Name: Tubing and supplies for nebulizer. Fax - [phone] 08/14/19   Hali Marry, MD  amiodarone (PACERONE) 200 MG tablet Take 0.5 tablets (100 mg total) by mouth daily. 02/23/20   Evans Lance, MD  blood glucose meter kit and supplies KIT Dispense based on patient and insurance preference. 08/23/19   Hali Marry, MD  bumetanide (BUMEX) 1 MG tablet TAKE 1 TABLET BY MOUTH  TWICE DAILY 09/30/20   Hali Marry, MD  clotrimazole-betamethasone (LOTRISONE) cream  08/26/17   [provider]  dicyclomine (BENTYL) 20 MG tablet Take by mouth. 12/25/19   [provider]  diltiazem (CARDIZEM CD) 180 MG 24 hr capsule TAKE 1 CAPSULE BY MOUTH  DAILY 11/11/20   Hali Marry, MD  EPIPEN 2-PAK 0.3 MG/0.3ML SOAJ injection INJECT 1 PEN INTO MUSCLE OF LATERAL THIGH FOR SEVERE ALLERGIC REACTION. CALL 911 AFTER USING THIS MEDICATION. 05/15/19   Hali Marry, MD  fexofenadine-pseudoephedrine (ALLEGRA-D 24) 180-240 MG 24 hr tablet Take 1 tablet by mouth daily.    [provider]  fluticasone-salmeterol (ADVAIR HFA) 115-21 MCG/ACT inhaler Inhale 2 puffs into  the lungs 2 (two) times daily. 10/31/20 02/25/21  [provider]  glucose blood (ACCU-CHEK GUIDE) test strip Dx DM E11.21 - Check fasting blood sugar every morning before breakfast and before lunch and dinner. 3 times daily 05/14/20   Hali Marry, MD  insulin glargine (LANTUS SOLOSTAR) 100 UNIT/ML Solostar Pen Inject 40 Units into the skin at bedtime. Patient taking differently: Inject 40 Units into the skin at bedtime. Takes 20 units at bedtime. 12/31/20   Hali Marry, MD  insulin lispro (HUMALOG KWIKPEN) 100 UNIT/ML KwikPen Inject 3-12 Units into the skin 3 (three) times daily as needed (hyperglycemia>150). Sliding scale. Administer subQ within 15 minutes before or immediately after a meal 08/15/20   Hali Marry, MD  Insulin Pen Needle 31G X 8 MM MISC To be used for injecting insulin Dx: E11.8 12/27/19   Hali Marry, MD  Lancets Misc. (ACCU-CHEK FASTCLIX LANCET) KIT E11.21 Dx DM Check fasting blood sugar every morning before breakfast and before lunch and dinner. 3 times daily  03/25/20   Hali Marry, MD  levothyroxine (SYNTHROID) 75 MCG tablet TAKE 1 TABLET BY MOUTH  DAILY 09/30/20   Hali Marry, MD  LINZESS 145 MCG CAPS capsule Take 145 mcg by mouth daily. Patient not taking: Reported on 02/25/2021 01/10/21   Jacki Cones, MD  LINZESS 72 MCG capsule Take 72 mcg by mouth every morning. 02/20/21   [provider]  Multiple Vitamins-Minerals (OCUVITE EYE + MULTI) TABS Take 1 tablet by mouth daily.    [provider]  Olopatadine HCl 0.2 % SOLN Place 1 drop into both eyes daily. Patient not taking: Reported on 02/25/2021    [provider]  pantoprazole (PROTONIX) 40 MG tablet Take 40 mg by mouth daily. 08/02/19   [provider]  pravastatin (PRAVACHOL) 40 MG tablet Take 1 tablet (40 mg total) by mouth at bedtime. 01/08/21   Hali Marry, MD  PROAIR HFA 108 [phone] Base) MCG/ACT inhaler USE 1 TO 2  INHALATIONS BY  MOUTH EVERY 6 HOURS AS  NEEDED FOR WHEEZING OR  SHORTNESS OF BREATH 09/30/20   Hali Marry, MD  RESTASIS 0.05 % ophthalmic emulsion  08/27/18   [provider]  Tiotropium Bromide Monohydrate 2.5 MCG/ACT AERS Inhale 2 puffs into the lungs daily. 10/31/20 02/25/21  [provider]  valACYclovir (VALTREX) 1000 MG tablet TAKE 1 TABLET BY MOUTH TWICE DAILY Patient not taking: Reported on 02/25/2021 09/04/20   Hali Marry, MD    Family History Family History  Problem Relation Age of Onset   Heart disease Mother    Melanoma Mother    Parkinson's disease Mother    Heart disease Father    Heart disease Brother    Heart failure Other    Breast cancer Other    Hypertension Other     Social History Social History   Tobacco Use   Smoking status: Former    Packs/day: 0.00    Years: 0.00    Pack years: 0.00    Types: Cigarettes    Quit date: 08/24/1966    Years since quitting: 54.5   Smokeless tobacco: Never  Vaping Use   Vaping Use: Never used  Substance Use Topics   Alcohol use: No   Drug use: No     Allergies   Penicillins, Accupril [quinapril hcl], Atorvastatin, Celebrex [celecoxib], Fluticasone, Levocetirizine, Losartan potassium, Naproxen, Nasonex [mometasone furoate], Chamomile, Vesicare [solifenacin], Aspirin, Baking soda-fluoride [sodium fluoride], Clindamycin/lincomycin, Codeine, Furosemide, Lactose, Lactose intolerance (gi), Lanolin-petrolatum, Metformin and related, Miralax [polyethylene glycol 3350], Miralax [polyethylene glycol], Nasonex [mometasone furoate], Quinine, Rifampin, Sulfa antibiotics, Sulfonamide derivatives, Tramadol, Wellbutrin [bupropion], Wool alcohol [lanolin], and Zoledronic acid   Review of Systems Review of Systems  HENT:  Positive for nosebleeds.     Physical Exam Triage Vital Signs ED Triage Vitals  Enc Vitals Group     BP      Pulse      Resp      Temp      Temp src      SpO2      Weight       Height      Head Circumference      Peak Flow      Pain Score      Pain Loc      Pain Edu?      Excl. in [place]?    No data found.  Updated Vital Signs BP (!) 154/78 (BP Location: Left Wrist)   Pulse 83  Temp 98 F (36.7 C) (Tympanic)   Resp 18   Ht 5' 1"  (1.549 m)   Wt 215 lb (97.5 kg)   SpO2 98%   BMI 40.62 kg/m       Physical Exam HENT:     Nose:     Right Nostril: Epistaxis present.     Left Nostril: Epistaxis present.     Comments: Post tampon removal/thorough inspection with nasal speculum: Turbinates are erythematous, homeostasis achieved with conservative measurements (previous tampon installation at home prior to presenting at clinic), trace dried occluding blood noticed in region of Kiesselbach's plexus, 10 minutes observance for rebleed, with no bleeding noted.    Mouth/Throat:     Mouth: Mucous membranes are moist.     Pharynx: Oropharynx is clear.  Eyes:     Extraocular Movements: Extraocular movements intact.     Conjunctiva/sclera: Conjunctivae normal.     Pupils: Pupils are equal, round, and reactive to light.  Cardiovascular:     Rate and Rhythm: Normal rate and regular rhythm.     Pulses: Normal pulses.     Heart sounds: Normal heart sounds.     Comments: S1, S2, paced Pulmonary:     Effort: Pulmonary effort is normal. No respiratory distress.     Breath sounds: Normal breath sounds. No stridor. No wheezing, rhonchi or rales.  Musculoskeletal:        General: Normal range of motion.     Cervical back: Normal range of motion and neck supple.  Skin:    General: Skin is warm and dry.  Neurological:     General: No focal deficit present.     Mental Status: She is oriented to person, place, and time.     Cranial Nerves: No cranial nerve deficit.     Sensory: No sensory deficit.     Motor: No weakness.     Coordination: Coordination normal.     Gait: Gait normal.  Psychiatric:        Mood and Affect: Mood normal.        Behavior: Behavior normal.         Thought Content: Thought content normal.     UC Treatments / Results  Labs (all labs ordered are listed, but only abnormal results are displayed) Labs Reviewed - No data to display  EKG   Radiology No results found.  Procedures Procedures (including critical care time)  Medications Ordered in UC Medications - No data to display  Initial Impression / Assessment and Plan / UC Course  I have reviewed the triage vital signs and the nursing notes.  Pertinent labs & imaging results that were available during my care of the patient were reviewed by me and considered in my medical decision making (see chart for details).    MDM: Epistaxis-resolved with conservative measures alone.  Patient discharged home, hemodynamically stable. Final Clinical Impressions(s) / UC Diagnoses   Final diagnoses:  Epistaxis     Discharge Instructions      Advised/encouraged patient if nose bleeding reoccurs to please repeat previous occlusion measures at home and follow-up with PCP or here for evaluation.     ED Prescriptions   None    PDMP not reviewed this encounter.   Eliezer Lofts, [place] 03/08/21 1645

## 2021-03-08 NOTE — ED Triage Notes (Signed)
Patient here for epistaxis starting this morning; states she has stopped and started 3 x today; has 2 tampons in nostrils. She is concerned because she has cardiac problems and has pacemaer; denies being on blood thinner and states usually bleeding stops quickly. Did get over heated yesterday while walking outdoor track. Has had covid vaccinations.

## 2021-03-08 NOTE — Discharge Instructions (Addendum)
Advised/encouraged patient if nose bleeding reoccurs to please repeat previous occlusion measures at home and follow-up with PCP or here for evaluation.

## 2021-03-09 DIAGNOSIS — E039 Hypothyroidism, unspecified: Secondary | ICD-10-CM | POA: Diagnosis not present

## 2021-03-09 DIAGNOSIS — R040 Epistaxis: Secondary | ICD-10-CM | POA: Diagnosis not present

## 2021-03-09 DIAGNOSIS — I428 Other cardiomyopathies: Secondary | ICD-10-CM | POA: Diagnosis not present

## 2021-03-09 DIAGNOSIS — E785 Hyperlipidemia, unspecified: Secondary | ICD-10-CM | POA: Diagnosis not present

## 2021-03-09 DIAGNOSIS — R58 Hemorrhage, not elsewhere classified: Secondary | ICD-10-CM | POA: Diagnosis not present

## 2021-03-09 DIAGNOSIS — Z950 Presence of cardiac pacemaker: Secondary | ICD-10-CM | POA: Diagnosis not present

## 2021-03-09 DIAGNOSIS — E119 Type 2 diabetes mellitus without complications: Secondary | ICD-10-CM | POA: Diagnosis not present

## 2021-03-09 DIAGNOSIS — Z794 Long term (current) use of insulin: Secondary | ICD-10-CM | POA: Diagnosis not present

## 2021-03-10 DIAGNOSIS — Z950 Presence of cardiac pacemaker: Secondary | ICD-10-CM | POA: Diagnosis not present

## 2021-03-10 DIAGNOSIS — E785 Hyperlipidemia, unspecified: Secondary | ICD-10-CM | POA: Diagnosis not present

## 2021-03-10 DIAGNOSIS — E119 Type 2 diabetes mellitus without complications: Secondary | ICD-10-CM | POA: Diagnosis not present

## 2021-03-10 DIAGNOSIS — E039 Hypothyroidism, unspecified: Secondary | ICD-10-CM | POA: Diagnosis not present

## 2021-03-10 DIAGNOSIS — R040 Epistaxis: Secondary | ICD-10-CM | POA: Diagnosis not present

## 2021-03-10 DIAGNOSIS — Z794 Long term (current) use of insulin: Secondary | ICD-10-CM | POA: Diagnosis not present

## 2021-03-10 DIAGNOSIS — I428 Other cardiomyopathies: Secondary | ICD-10-CM | POA: Diagnosis not present

## 2021-03-11 DIAGNOSIS — R040 Epistaxis: Secondary | ICD-10-CM | POA: Diagnosis not present

## 2021-03-12 DIAGNOSIS — J3489 Other specified disorders of nose and nasal sinuses: Secondary | ICD-10-CM | POA: Diagnosis not present

## 2021-03-12 DIAGNOSIS — R040 Epistaxis: Secondary | ICD-10-CM | POA: Diagnosis not present

## 2021-03-19 ENCOUNTER — Telehealth: Payer: Self-pay | Admitting: Family Medicine

## 2021-03-19 NOTE — Chronic Care Management (AMB) (Signed)
  Chronic Care Management   Note  03/19/2021 Name: Tanya Hogan MRN: [id number] DOB: 10-21-1950  Tanya Hogan is a 70 y.o. year old female who is a primary care patient of Madilyn Fireman, Rene Kocher, MD. I reached out to Tanya Hogan by phone today in response to a referral sent by Tanya Hogan PCP, Hali Marry, MD.   Tanya Hogan was given information about Chronic Care Management services today including:  CCM service includes personalized support from designated clinical staff supervised by her physician, including individualized plan of care and coordination with other care providers 24/7 contact phone numbers for assistance for urgent and routine care needs. Service will only be billed when office clinical staff spend 20 minutes or more in a month to coordinate care. Only one practitioner may furnish and bill the service in a calendar month. The patient may stop CCM services at any time (effective at the end of the month) by phone call to the office staff.   Patient agreed to services and verbal consent obtained.   Follow up plan:   Tanya Hogan Upstream Scheduler

## 2021-03-26 DIAGNOSIS — D0511 Intraductal carcinoma in situ of right breast: Secondary | ICD-10-CM | POA: Diagnosis not present

## 2021-03-26 DIAGNOSIS — I700 Atherosclerosis of aorta: Secondary | ICD-10-CM | POA: Diagnosis not present

## 2021-03-26 DIAGNOSIS — K7581 Nonalcoholic steatohepatitis (NASH): Secondary | ICD-10-CM | POA: Diagnosis not present

## 2021-03-26 DIAGNOSIS — I10 Essential (primary) hypertension: Secondary | ICD-10-CM | POA: Diagnosis not present

## 2021-03-26 DIAGNOSIS — I5022 Chronic systolic (congestive) heart failure: Secondary | ICD-10-CM | POA: Diagnosis not present

## 2021-03-26 DIAGNOSIS — Z1231 Encounter for screening mammogram for malignant neoplasm of breast: Secondary | ICD-10-CM | POA: Diagnosis not present

## 2021-03-31 ENCOUNTER — Telehealth: Payer: Self-pay | Admitting: Family Medicine

## 2021-03-31 ENCOUNTER — Ambulatory Visit (INDEPENDENT_AMBULATORY_CARE_PROVIDER_SITE_OTHER): Payer: Medicare Other

## 2021-03-31 DIAGNOSIS — I442 Atrioventricular block, complete: Secondary | ICD-10-CM | POA: Diagnosis not present

## 2021-03-31 DIAGNOSIS — L602 Onychogryphosis: Secondary | ICD-10-CM | POA: Diagnosis not present

## 2021-03-31 DIAGNOSIS — E119 Type 2 diabetes mellitus without complications: Secondary | ICD-10-CM | POA: Diagnosis not present

## 2021-03-31 DIAGNOSIS — M79671 Pain in right foot: Secondary | ICD-10-CM | POA: Diagnosis not present

## 2021-03-31 DIAGNOSIS — L84 Corns and callosities: Secondary | ICD-10-CM | POA: Diagnosis not present

## 2021-03-31 DIAGNOSIS — M79672 Pain in left foot: Secondary | ICD-10-CM | POA: Diagnosis not present

## 2021-03-31 NOTE — Telephone Encounter (Signed)
Tanya Hogan dropped off paperwork to be filled out by a provider, forms did not require that the patient fill out any portion. Paperwork was left in [employer]. Pt informed of Dr. Madilyn Fireman being out and that there is still a possible fee and 3-5 business day turn around.

## 2021-04-02 LAB — CUP PACEART REMOTE DEVICE CHECK
Battery Remaining Longevity: 13 mo
Battery Voltage: 2.88 V
Brady Statistic AP VP Percent: 6.51 %
Brady Statistic AP VS Percent: 0.02 %
Brady Statistic AS VP Percent: 92.43 %
Brady Statistic AS VS Percent: 1.04 %
Brady Statistic RA Percent Paced: 6.48 %
Brady Statistic RV Percent Paced: 98.33 %
Date Time Interrogation Session: 20220807054329
Implantable Lead Implant Date: 20030324
Implantable Lead Implant Date: 20030324
Implantable Lead Implant Date: 20030324
Implantable Lead Location: 753858
Implantable Lead Location: 753859
Implantable Lead Location: 753860
Implantable Lead Model: 4068
Implantable Lead Model: 4068
Implantable Lead Model: 4193
Implantable Pulse Generator Implant Date: 20151231
Lead Channel Impedance Value: 380 Ohm
Lead Channel Impedance Value: 380 Ohm
Lead Channel Impedance Value: 380 Ohm
Lead Channel Impedance Value: 4047 Ohm
Lead Channel Impedance Value: 4047 Ohm
Lead Channel Impedance Value: 4047 Ohm
Lead Channel Impedance Value: 513 Ohm
Lead Channel Impedance Value: 760 Ohm
Lead Channel Impedance Value: 931 Ohm
Lead Channel Pacing Threshold Amplitude: 1 V
Lead Channel Pacing Threshold Amplitude: 1.875 V
Lead Channel Pacing Threshold Pulse Width: 0.4 ms
Lead Channel Pacing Threshold Pulse Width: 0.4 ms
Lead Channel Sensing Intrinsic Amplitude: 1.875 mV
Lead Channel Sensing Intrinsic Amplitude: 1.875 mV
Lead Channel Sensing Intrinsic Amplitude: 13.25 mV
Lead Channel Sensing Intrinsic Amplitude: 13.25 mV
Lead Channel Setting Pacing Amplitude: 1.75 V
Lead Channel Setting Pacing Amplitude: 2.5 V
Lead Channel Setting Pacing Amplitude: 3.5 V
Lead Channel Setting Pacing Pulse Width: 0.8 ms
Lead Channel Setting Pacing Pulse Width: 0.8 ms
Lead Channel Setting Sensing Sensitivity: 4 mV

## 2021-04-07 DIAGNOSIS — Z950 Presence of cardiac pacemaker: Secondary | ICD-10-CM | POA: Diagnosis not present

## 2021-04-07 DIAGNOSIS — Z880 Allergy status to penicillin: Secondary | ICD-10-CM | POA: Diagnosis not present

## 2021-04-07 DIAGNOSIS — M1990 Unspecified osteoarthritis, unspecified site: Secondary | ICD-10-CM | POA: Diagnosis not present

## 2021-04-07 DIAGNOSIS — K589 Irritable bowel syndrome without diarrhea: Secondary | ICD-10-CM | POA: Diagnosis not present

## 2021-04-07 DIAGNOSIS — Z886 Allergy status to analgesic agent status: Secondary | ICD-10-CM | POA: Diagnosis not present

## 2021-04-07 DIAGNOSIS — R7989 Other specified abnormal findings of blood chemistry: Secondary | ICD-10-CM | POA: Diagnosis not present

## 2021-04-07 DIAGNOSIS — I509 Heart failure, unspecified: Secondary | ICD-10-CM | POA: Diagnosis not present

## 2021-04-07 DIAGNOSIS — Z91018 Allergy to other foods: Secondary | ICD-10-CM | POA: Diagnosis not present

## 2021-04-07 DIAGNOSIS — Z881 Allergy status to other antibiotic agents status: Secondary | ICD-10-CM | POA: Diagnosis not present

## 2021-04-07 DIAGNOSIS — K7460 Unspecified cirrhosis of liver: Secondary | ICD-10-CM | POA: Diagnosis not present

## 2021-04-07 DIAGNOSIS — Z87891 Personal history of nicotine dependence: Secondary | ICD-10-CM | POA: Diagnosis not present

## 2021-04-07 DIAGNOSIS — Z853 Personal history of malignant neoplasm of breast: Secondary | ICD-10-CM | POA: Diagnosis not present

## 2021-04-07 DIAGNOSIS — K219 Gastro-esophageal reflux disease without esophagitis: Secondary | ICD-10-CM | POA: Diagnosis not present

## 2021-04-07 DIAGNOSIS — Z888 Allergy status to other drugs, medicaments and biological substances status: Secondary | ICD-10-CM | POA: Diagnosis not present

## 2021-04-07 DIAGNOSIS — K760 Fatty (change of) liver, not elsewhere classified: Secondary | ICD-10-CM | POA: Diagnosis not present

## 2021-04-07 DIAGNOSIS — E1122 Type 2 diabetes mellitus with diabetic chronic kidney disease: Secondary | ICD-10-CM | POA: Diagnosis not present

## 2021-04-07 DIAGNOSIS — E079 Disorder of thyroid, unspecified: Secondary | ICD-10-CM | POA: Diagnosis not present

## 2021-04-07 DIAGNOSIS — Z794 Long term (current) use of insulin: Secondary | ICD-10-CM | POA: Diagnosis not present

## 2021-04-07 DIAGNOSIS — N189 Chronic kidney disease, unspecified: Secondary | ICD-10-CM | POA: Diagnosis not present

## 2021-04-07 DIAGNOSIS — G4733 Obstructive sleep apnea (adult) (pediatric): Secondary | ICD-10-CM | POA: Diagnosis not present

## 2021-04-07 DIAGNOSIS — T383X1A Poisoning by insulin and oral hypoglycemic [antidiabetic] drugs, accidental (unintentional), initial encounter: Secondary | ICD-10-CM | POA: Diagnosis not present

## 2021-04-07 DIAGNOSIS — Z91041 Radiographic dye allergy status: Secondary | ICD-10-CM | POA: Diagnosis not present

## 2021-04-07 DIAGNOSIS — E7800 Pure hypercholesterolemia, unspecified: Secondary | ICD-10-CM | POA: Diagnosis not present

## 2021-04-07 DIAGNOSIS — I130 Hypertensive heart and chronic kidney disease with heart failure and stage 1 through stage 4 chronic kidney disease, or unspecified chronic kidney disease: Secondary | ICD-10-CM | POA: Diagnosis not present

## 2021-04-09 DIAGNOSIS — G4733 Obstructive sleep apnea (adult) (pediatric): Secondary | ICD-10-CM | POA: Diagnosis not present

## 2021-04-09 DIAGNOSIS — G4737 Central sleep apnea in conditions classified elsewhere: Secondary | ICD-10-CM | POA: Diagnosis not present

## 2021-04-09 NOTE — Telephone Encounter (Signed)
Signed and placed back in [employer].

## 2021-04-09 NOTE — Telephone Encounter (Signed)
Forms completed and placed in pcp's box for signature.

## 2021-04-09 NOTE — Telephone Encounter (Signed)
Form completed,faxed,confirmation received and scanned into patient's chart.   Pt called and informed that this has been completed.

## 2021-04-10 ENCOUNTER — Telehealth: Payer: Self-pay

## 2021-04-10 NOTE — Telephone Encounter (Signed)
Transition Care Management Follow-up Telephone Call Date of discharge and from where: 04/08/2021 from [facility] How have you been since you were released from the hospital? Pt stated that she is feeling a lot better.  Any questions or concerns? No  Items Reviewed: Did the pt receive and understand the discharge instructions provided? Yes  Medications obtained and verified? Yes  Other? No  Any new allergies since your discharge? No  Dietary orders reviewed? No Do you have support at home? Yes   Functional Questionnaire: (I = Independent and D = Dependent) ADLs: I  Bathing/Dressing- I  Meal Prep- I  Eating- I  Maintaining continence- I  Transferring/Ambulation- I  Managing Meds- I   Follow up appointments reviewed:  PCP Hospital f/u appt confirmed? Yes  Scheduled to see Beatrice Lecher, MD on 04/16/2021 @ 08:50am. Are transportation arrangements needed? No  If their condition worsens, is the pt aware to call PCP or go to the Emergency Dept.? Yes Was the patient provided with contact information for the PCP's office or ED? Yes Was to pt encouraged to call back with questions or concerns? Yes

## 2021-04-15 ENCOUNTER — Other Ambulatory Visit: Payer: Self-pay | Admitting: Internal Medicine

## 2021-04-16 ENCOUNTER — Encounter: Payer: Self-pay | Admitting: Family Medicine

## 2021-04-16 ENCOUNTER — Ambulatory Visit (INDEPENDENT_AMBULATORY_CARE_PROVIDER_SITE_OTHER): Payer: Medicare Other | Admitting: Family Medicine

## 2021-04-16 ENCOUNTER — Other Ambulatory Visit: Payer: Self-pay

## 2021-04-16 VITALS — BP 132/57 | HR 88 | Temp 98.4°F | Ht 61.0 in | Wt 219.0 lb

## 2021-04-16 DIAGNOSIS — E118 Type 2 diabetes mellitus with unspecified complications: Secondary | ICD-10-CM

## 2021-04-16 DIAGNOSIS — J3489 Other specified disorders of nose and nasal sinuses: Secondary | ICD-10-CM | POA: Diagnosis not present

## 2021-04-16 DIAGNOSIS — J4540 Moderate persistent asthma, uncomplicated: Secondary | ICD-10-CM

## 2021-04-16 DIAGNOSIS — I10 Essential (primary) hypertension: Secondary | ICD-10-CM | POA: Diagnosis not present

## 2021-04-16 DIAGNOSIS — K7581 Nonalcoholic steatohepatitis (NASH): Secondary | ICD-10-CM

## 2021-04-16 DIAGNOSIS — R609 Edema, unspecified: Secondary | ICD-10-CM

## 2021-04-16 DIAGNOSIS — E038 Other specified hypothyroidism: Secondary | ICD-10-CM | POA: Diagnosis not present

## 2021-04-16 DIAGNOSIS — K581 Irritable bowel syndrome with constipation: Secondary | ICD-10-CM

## 2021-04-16 DIAGNOSIS — I5022 Chronic systolic (congestive) heart failure: Secondary | ICD-10-CM | POA: Diagnosis not present

## 2021-04-16 LAB — POCT GLYCOSYLATED HEMOGLOBIN (HGB A1C): Hemoglobin A1C: 6.4 % — AB (ref 4.0–5.6)

## 2021-04-16 NOTE — Assessment & Plan Note (Addendum)
Continue daily Advair.  Using her albuterol inhaler as needed.  She did respond well to prednisone last week.  He is feeling some better but still has a cough in the morning.

## 2021-04-16 NOTE — Progress Notes (Signed)
Your lab work is within acceptable range and there are no concerning findings.   ?

## 2021-04-16 NOTE — Assessment & Plan Note (Signed)
He does report a little bit more swelling in her ankles today she did take her diuretic she says around 4- 5 AM.  Okay to take an extra tab around lunch if she does not feel like it is causing diuresis.

## 2021-04-16 NOTE — Assessment & Plan Note (Signed)
Well controlled. Continue current regimen. Follow up in  3-4 mo

## 2021-04-16 NOTE — Progress Notes (Signed)
Established Patient Office Visit  Subjective:  Patient ID: Tanya Hogan, female    DOB: 11/17/1950  Age: 70 y.o. MRN: [id number]  CC:  Chief Complaint  Patient presents with   Diabetes    HPI Tanya Hogan presents for   Diabetes - no hypoglycemic events. No wounds or sores that are not healing well. No increased thirst or urination. Checking glucose at home. Taking medications as prescribed without any side effects.  He is having to use her mealtime insulin 1-2 times a day.  Blood sugars have been running in the 140s to 150s and she is concerned.  She was also on prednisone last week.  Also seen in the ED on 04/07/2021 for accidentally taking 25 units of Humalog instead of her Lantus.  She felt shaky but otherwise did well and did not have any severe hypoglycemia.  She was released back home from the ED.  Seen at [facility] about 3 weeks ago for a nosebleed.  Now has a new ENT, Dr. Laurance Flatten for her nosebleeds.  She said she had another small bleed on Sunday but used her Afrin and put a tampon in her nose and was able to get it stopped on her own.  Cough -ports that she has been a little bit of an asthma flare since she had the nosebleed she has been getting some white chunks up in the morning when she coughs.  She had one of her provider send in some prednisone last week and did complete the 5 days she said that did help greatly.   Hypothyroidism-no specific concerns.  Past Medical History:  Diagnosis Date   Alkaline phosphatase elevation    Asthma    Breast cancer ([place]) 2011   Carpal tunnel syndrome    Carpal tunnel syndrome    bilateral   CHF (congestive heart failure) (HCC)    Cirrhosis, non-alcoholic (HCC)    Closed fracture of unspecified part of radius (alone)    DDD (degenerative disc disease), lumbar    Depression    pt denies   Diabetes mellitus without complication ([place])    Enlarged heart    Fibromyalgia    GERD (gastroesophageal reflux disease)     History of kidney stones    HTN (hypertension)    Hypothyroidism    IBS (irritable bowel syndrome)    LBBB (left bundle branch block)    Microcytic anemia 05/14/2017   Pneumonia    Presence of permanent cardiac pacemaker    PVD (peripheral vascular disease) ([place])    2019   Sleep apnea    uses cpap    Past Surgical History:  Procedure Laterality Date   BREAST LUMPECTOMY Left 2011   COLONOSCOPY     FOOT SURGERY     KNEE SURGERY     MASTECTOMY Right    05/2019   PACEMAKER GENERATOR CHANGE N/A 08/23/2014   Procedure: PACEMAKER GENERATOR CHANGE;  Surgeon: Evans Lance, MD;  Location: [facility] CATH LAB;  Service: Cardiovascular;  Laterality: N/A;   PACEMAKER PLACEMENT  2009   [facility]   RECTAL SURGERY  1976   REDUCTION MAMMAPLASTY Left    05/2019   TOTAL SHOULDER ARTHROPLASTY Right 03/10/2018   TOTAL SHOULDER ARTHROPLASTY Right 03/10/2018   Procedure: RIGHT TOTAL SHOULDER ARTHROPLASTY;  Surgeon: [name], MD;  Location: [place];  Service: Orthopedics;  Laterality: Right;   TUBAL LIGATION      Family History  Problem Relation Age of Onset   Heart  disease Mother    Melanoma Mother    Parkinson's disease Mother    Heart disease Father    Heart disease Brother    Heart failure Other    Breast cancer Other    Hypertension Other     Social History   Socioeconomic History   Marital status: Divorced    Spouse name: Not on file   Number of children: 2   Years of education: 16   Highest education level: Associate degree: academic program  Occupational History   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 Lives with and helps her brother. Likes Systems developer, Social worker and music. Loves animals.   Social Determinants of Health   Financial Resource Strain: Low Risk    Difficulty of Paying Living Expenses: Not hard at all  Food Insecurity: No Food Insecurity   Worried About Charity fundraiser in the Last Year: Never true   [place] in the Last Year: Never true  Transportation Needs: No Transportation Needs   Lack of Transportation (Medical): No   Lack of Transportation (Non-Medical): No  Physical Activity: Inactive   Days of Exercise per Week: 0 days   Minutes of Exercise per Session: 0 min  Stress: No Stress Concern Present   Feeling of Stress : Not at all  Social Connections: Socially Isolated   Frequency of Communication with Friends and Family: More than three times a week   Frequency of Social Gatherings with Friends and Family: Once a week   Attends Religious Services: Never   Marine scientist or Organizations: No   Attends Music therapist: Never   Marital Status: Divorced  Human resources officer Violence: Not At Risk   Fear of Current or Ex-Partner: No   Emotionally Abused: No   Physically Abused: No   Sexually Abused: No    Outpatient Medications Prior to Visit  Medication Sig Dispense Refill   Accu-Chek FastClix Lancets MISC Apply topically.     alendronate (FOSAMAX) 70 MG tablet Take 1 tablet (70 mg total) by mouth every 7 (seven) days. Take with a full glass of water on an empty stomach. 12 tablet 4   AMBULATORY NON FORMULARY MEDICATION Medication Name: Tubing and supplies for nebulizer. Fax - [phone] 1 vial 0   amiodarone (PACERONE) 200 MG tablet TAKE ONE-HALF TABLET BY  MOUTH DAILY 45 tablet 2   blood glucose meter kit and supplies KIT Dispense based on patient and insurance preference. 1 each 0   bumetanide (BUMEX) 1 MG tablet TAKE 1 TABLET BY MOUTH  TWICE  DAILY 180 tablet 3   clotrimazole-betamethasone (LOTRISONE) cream      dicyclomine (BENTYL) 20 MG tablet Take by mouth.     diltiazem (CARDIZEM CD) 180 MG 24 hr capsule TAKE 1 CAPSULE BY MOUTH  DAILY 90 capsule 3   EPIPEN 2-PAK 0.3 MG/0.3ML SOAJ injection INJECT 1 PEN INTO MUSCLE OF  LATERAL THIGH FOR SEVERE ALLERGIC REACTION. CALL 911 AFTER USING THIS MEDICATION. 1 each 2   fexofenadine-pseudoephedrine (ALLEGRA-D 24) 180-240 MG 24 hr tablet Take 1 tablet by mouth daily.     glucose blood (ACCU-CHEK GUIDE) test strip Dx DM E11.21 - Check fasting blood sugar every morning before breakfast and before lunch and dinner. 3 times daily 900 each prn   insulin glargine (LANTUS SOLOSTAR) 100 UNIT/ML Solostar Pen Inject 40 Units into the skin at bedtime. (Patient taking differently: Inject 40 Units into the skin at bedtime. Takes 20 units at bedtime.) 45 mL 1   insulin lispro (HUMALOG KWIKPEN) 100 UNIT/ML KwikPen Inject 3-12 Units into the skin 3 (three) times daily as needed (hyperglycemia>150). Sliding scale. Administer subQ within 15 minutes before or immediately after a meal 9 mL 2   Insulin Pen Needle 31G X 8 MM MISC To be used for injecting insulin Dx: E11.8 300 each 4   Lancets Misc. (ACCU-CHEK FASTCLIX LANCET) KIT E11.21 Dx DM Check fasting blood sugar every morning before breakfast and before lunch and dinner. 3 times daily 300 kit prn   levothyroxine (SYNTHROID) 75 MCG tablet TAKE 1 TABLET BY MOUTH  DAILY 90 tablet 3   LINZESS 72 MCG capsule Take 72 mcg by mouth 2 (two) times daily.     Multiple Vitamins-Minerals (OCUVITE EYE + MULTI) TABS Take 1 tablet by mouth daily.     Olopatadine HCl 0.2 % SOLN Place 1 drop into both eyes daily.     pantoprazole (PROTONIX) 40 MG tablet Take 40 mg by mouth daily.     pravastatin (PRAVACHOL) 40 MG tablet Take 1 tablet (40 mg total) by mouth at bedtime. 90 tablet 2   PROAIR HFA 108 (90 Base) MCG/ACT inhaler USE 1 TO 2 INHALATIONS BY  MOUTH EVERY 6 HOURS AS  NEEDED  FOR WHEEZING OR  SHORTNESS OF BREATH 34 g 3   RESTASIS 0.05 % ophthalmic emulsion      valACYclovir (VALTREX) 1000 MG tablet TAKE 1 TABLET BY MOUTH TWICE DAILY 14 tablet 2   fluticasone-salmeterol (ADVAIR HFA) 115-21 MCG/ACT inhaler Inhale 2 puffs into the lungs 2 (two) times daily.     Tiotropium Bromide Monohydrate 2.5 MCG/ACT AERS Inhale 2 puffs into the lungs daily.     LINZESS 145 MCG CAPS capsule Take 145 mcg by mouth daily.  4   No facility-administered medications prior to visit.    Allergies  Allergen Reactions   Penicillins Anaphylaxis    Has patient had a PCN reaction causing immediate rash, facial/tongue/throat swelling, SOB or lightheadedness with hypotension: Yes Has patient had a PCN reaction causing severe rash involving mucus membranes or skin necrosis: No Has patient had a PCN reaction that required hospitalization: No Has patient had a PCN reaction occurring within the last 10 years: No If all of the above answers are "NO", then may proceed with Cephalosporin use.    Accupril [Quinapril Hcl] Other (See Comments)    Hyperkalemia   Atorvastatin Other (See Comments)    msucle aches   Celebrex [Celecoxib]     ulcers   Fluticasone Other (See Comments)    Nasal ulcer.     Levocetirizine     Doesn't recall   Levomenol Other (See Comments)    unknown unknown    Losartan Potassium Itching   Naproxen Swelling   Nasonex [Mometasone Furoate] Other (See Comments)    nosebleed   Chamomile Other (See Comments)    unknown   Vesicare [Solifenacin] Other (  See Comments)    "stopped me from urinating"   Aspirin Other (See Comments)    Pt states "burns holes in stomach", ulcers    Baking Soda-Fluoride [Sodium Fluoride] Itching   Clindamycin/Lincomycin Rash   Codeine Nausea And Vomiting   Furosemide Other (See Comments)    Nose bleed   Lactose    Lactose Intolerance (Gi) Other (See Comments)    Increased calcium, sneezing, nasal issues   Lanolin-Petrolatum Rash    Metformin And Related Diarrhea   Miralax [Polyethylene Glycol 3350]    Miralax [Polyethylene Glycol] Rash   Nasonex [Mometasone Furoate] Other (See Comments)   Quinine Rash   Rifampin Other (See Comments)    DOES NOT REMEMBER THIS MEDICATION OR ALLERGY   Sulfa Antibiotics Rash   Sulfonamide Derivatives Rash   Tramadol Rash    Anxiety   Wellbutrin [Bupropion] Other (See Comments)    Muscle aches, pain, "made my skin crawl"   Wool Alcohol [Lanolin] Rash   Zoledronic Acid Other (See Comments) and Rash    shaking    ROS Review of Systems    Objective:    Physical Exam Constitutional:      Appearance: Normal appearance. She is well-developed.  HENT:     Head: Normocephalic and atraumatic.  Cardiovascular:     Rate and Rhythm: Normal rate and regular rhythm.     Heart sounds: Normal heart sounds.  Pulmonary:     Effort: Pulmonary effort is normal.     Breath sounds: Normal breath sounds.  Skin:    General: Skin is warm and dry.  Neurological:     Mental Status: She is alert and oriented to person, place, and time.  Psychiatric:        Behavior: Behavior normal.    BP (!) 132/57   Pulse 88   Temp 98.4 F (36.9 C)   Ht 5' 1"  (1.549 m)   Wt 219 lb (99.3 kg)   SpO2 98%   BMI 41.38 kg/m  Wt Readings from Last 3 Encounters:  04/16/21 219 lb (99.3 kg)  03/08/21 215 lb (97.5 kg)  02/25/21 220 lb 1.9 oz (99.8 kg)     Health Maintenance Due  Topic Date Due   COVID-19 Vaccine (4 - Booster for Moderna series) 10/19/2020    There are no preventive care reminders to display for this patient.  Lab Results  Component Value Date   TSH 1.27 01/09/2020   Lab Results  Component Value Date   WBC 6.5 03/14/2019   HGB 14.1 03/14/2019   HCT 42.7 03/14/2019   MCV 92.0 03/14/2019   PLT 287 03/14/2019   Lab Results  Component Value Date   NA 140 03/26/2020   K 4.8 03/26/2020   CHLORIDE 105 10/30/2014   CO2 27 03/26/2020   GLUCOSE 123 (H) 03/26/2020   BUN 19  03/26/2020   CREATININE 0.89 03/26/2020   BILITOT 0.4 03/26/2020   ALKPHOS 100 03/02/2018   AST 20 03/26/2020   ALT 16 03/26/2020   PROT 6.6 03/26/2020   ALBUMIN 4.0 03/02/2018   CALCIUM 9.9 03/26/2020   ANIONGAP 8 03/11/2018   EGFR 78 (L) 10/30/2014   Lab Results  Component Value Date   CHOL 194 03/26/2020   Lab Results  Component Value Date   HDL 67 03/26/2020   Lab Results  Component Value Date   LDLCALC 99 03/26/2020   Lab Results  Component Value Date   TRIG 181 (H) 03/26/2020   Lab Results  Component  Value Date   CHOLHDL 2.9 03/26/2020   Lab Results  Component Value Date   HGBA1C 6.4 (A) 04/16/2021      Assessment & Plan:   Problem List Items Addressed This Visit       Cardiovascular and Mediastinum   Chronic systolic heart failure ([place])    He does report a little bit more swelling in her ankles today she did take her diuretic she says around 4- 5 AM.  Okay to take an extra tab around lunch if she does not feel like it is causing diuresis.      Benign essential hypertension    Well controlled. Continue current regimen. Follow up in  3-4 mo       Relevant Orders   CBC   COMPLETE METABOLIC PANEL WITH GFR   Lipid panel   TSH     Respiratory   Nasal septal perforation   Asthma, moderate persistent    Continue daily Advair.  Using her albuterol inhaler as needed.  She did respond well to prednisone last week.  He is feeling some better but still has a cough in the morning.        Digestive   NASH (nonalcoholic steatohepatitis)    Due to recheck liver enzymes.      Irritable bowel syndrome with constipation    SHe is now on a lower dose of Linzess.        Endocrine   Hypothyroidism    Due to recheck TSH.      Relevant Orders   CBC   COMPLETE METABOLIC PANEL WITH GFR   Lipid panel   TSH   Controlled diabetes mellitus type 2 with complications ([place]) - Primary    1C up slightly to 6.4 but I still think this is fantastic we had a  discussion about letting her run just a little bit higher not trying to push her sugar all the way down to 100.  Try to keep it closer to under 130 or even right around 130 and rather her run just a little bit higher than have hypoglycemia.  Also encouraged her to continue to work on eating a low-carb diet.      Relevant Orders   POCT glycosylated hemoglobin (Hb A1C) (Completed)   CBC   COMPLETE METABOLIC PANEL WITH GFR   Lipid panel   TSH     Other   Peripheral edema    No orders of the defined types were placed in this encounter.   Follow-up: No follow-ups on file.   I spent 45 minutes on the day of the encounter to include pre-visit record review, face-to-face time with the patient and post visit ordering of test.   Beatrice Lecher, MD

## 2021-04-16 NOTE — Assessment & Plan Note (Signed)
Due to recheck liver enzymes.

## 2021-04-16 NOTE — Assessment & Plan Note (Signed)
1C up slightly to 6.4 but I still think this is fantastic we had a discussion about letting her run just a little bit higher not trying to push her sugar all the way down to 100.  Try to keep it closer to under 130 or even right around 130 and rather her run just a little bit higher than have hypoglycemia.  Also encouraged her to continue to work on eating a low-carb diet.

## 2021-04-16 NOTE — Assessment & Plan Note (Signed)
SHe is now on a lower dose of Linzess.

## 2021-04-16 NOTE — Assessment & Plan Note (Signed)
Due to recheck TSH.

## 2021-04-17 LAB — COMPLETE METABOLIC PANEL WITH GFR
AG Ratio: 1.6 (calc) (ref 1.0–2.5)
ALT: 18 U/L (ref 6–29)
AST: 22 U/L (ref 10–35)
Albumin: 4.4 g/dL (ref 3.6–5.1)
Alkaline phosphatase (APISO): 76 U/L (ref 37–153)
BUN: 14 mg/dL (ref 7–25)
CO2: 29 mmol/L (ref 20–32)
Calcium: 10 mg/dL (ref 8.6–10.4)
Chloride: 104 mmol/L (ref 98–110)
Creat: 0.94 mg/dL (ref 0.60–1.00)
Globulin: 2.7 g/dL (calc) (ref 1.9–3.7)
Glucose, Bld: 118 mg/dL — ABNORMAL HIGH (ref 65–99)
Potassium: 4 mmol/L (ref 3.5–5.3)
Sodium: 142 mmol/L (ref 135–146)
Total Bilirubin: 0.4 mg/dL (ref 0.2–1.2)
Total Protein: 7.1 g/dL (ref 6.1–8.1)
eGFR: 65 mL/min/{1.73_m2} (ref >=60)

## 2021-04-17 LAB — CBC
HCT: 38 % (ref 35.0–45.0)
Hemoglobin: 12.2 g/dL (ref 11.7–15.5)
MCH: 29.2 pg (ref 27.0–33.0)
MCHC: 32.1 g/dL (ref 32.0–36.0)
MCV: 90.9 fL (ref 80.0–100.0)
MPV: 11.3 fL (ref 7.5–12.5)
Platelets: 299 10*3/uL (ref 140–400)
RBC: 4.18 10*6/uL (ref 3.80–5.10)
RDW: 12.8 % (ref 11.0–15.0)
WBC: 5.4 10*3/uL (ref 3.8–10.8)

## 2021-04-17 LAB — LIPID PANEL
Cholesterol: 168 mg/dL (ref <200)
HDL: 73 mg/dL (ref >=50)
LDL Cholesterol (Calc): 74 mg/dL (calc)
Non-HDL Cholesterol (Calc): 95 mg/dL (calc) (ref <130)
Total CHOL/HDL Ratio: 2.3 (calc) (ref <5.0)
Triglycerides: 120 mg/dL (ref <150)

## 2021-04-17 LAB — TSH: TSH: 4.9 mIU/L — ABNORMAL HIGH (ref 0.40–4.50)

## 2021-04-18 DIAGNOSIS — G4737 Central sleep apnea in conditions classified elsewhere: Secondary | ICD-10-CM | POA: Diagnosis not present

## 2021-04-18 DIAGNOSIS — G4733 Obstructive sleep apnea (adult) (pediatric): Secondary | ICD-10-CM | POA: Diagnosis not present

## 2021-04-18 NOTE — Progress Notes (Signed)
The thyroid test came back separately today.  It looks like it is off slightly.  Please just check that she has been taking her thyroid medication regularly and has not missed any doses recently if she has been pretty consistent then I would like to make a change to her regimen if she has missed some doses here there then we can always recheck it again in 6 to 8 weeks.  Also make sure you are taking it away from other multivitamins, minerals or supplements.

## 2021-04-21 DIAGNOSIS — I10 Essential (primary) hypertension: Secondary | ICD-10-CM | POA: Diagnosis not present

## 2021-04-21 DIAGNOSIS — C50911 Malignant neoplasm of unspecified site of right female breast: Secondary | ICD-10-CM | POA: Diagnosis not present

## 2021-04-21 DIAGNOSIS — K581 Irritable bowel syndrome with constipation: Secondary | ICD-10-CM | POA: Diagnosis not present

## 2021-04-21 DIAGNOSIS — K7581 Nonalcoholic steatohepatitis (NASH): Secondary | ICD-10-CM | POA: Diagnosis not present

## 2021-04-21 DIAGNOSIS — E785 Hyperlipidemia, unspecified: Secondary | ICD-10-CM | POA: Diagnosis not present

## 2021-04-21 DIAGNOSIS — K7401 Hepatic fibrosis, early fibrosis: Secondary | ICD-10-CM | POA: Diagnosis not present

## 2021-04-22 ENCOUNTER — Other Ambulatory Visit: Payer: Self-pay

## 2021-04-22 DIAGNOSIS — E038 Other specified hypothyroidism: Secondary | ICD-10-CM

## 2021-04-24 NOTE — Progress Notes (Signed)
Remote pacemaker transmission.   

## 2021-04-26 DIAGNOSIS — K760 Fatty (change of) liver, not elsewhere classified: Secondary | ICD-10-CM | POA: Diagnosis not present

## 2021-04-26 DIAGNOSIS — N281 Cyst of kidney, acquired: Secondary | ICD-10-CM | POA: Diagnosis not present

## 2021-04-26 DIAGNOSIS — K581 Irritable bowel syndrome with constipation: Secondary | ICD-10-CM | POA: Diagnosis not present

## 2021-04-26 DIAGNOSIS — K8020 Calculus of gallbladder without cholecystitis without obstruction: Secondary | ICD-10-CM | POA: Diagnosis not present

## 2021-04-26 DIAGNOSIS — K7581 Nonalcoholic steatohepatitis (NASH): Secondary | ICD-10-CM | POA: Diagnosis not present

## 2021-05-01 ENCOUNTER — Telehealth: Payer: Self-pay

## 2021-05-01 NOTE — Telephone Encounter (Signed)
Tanya Hogan called and left a message stating she does want to go ahead with an increase in the levothyroxine. Please advise.

## 2021-05-01 NOTE — Telephone Encounter (Signed)
I would rather stick with original plan of moving the MVI and recheck in  6 weeks.

## 2021-05-02 NOTE — Telephone Encounter (Signed)
LVM for pt to call to discuss.  T. Ariza Evans, CMA  

## 2021-05-05 ENCOUNTER — Telehealth: Payer: Self-pay

## 2021-05-05 NOTE — Telephone Encounter (Signed)
Pt states that hair is falling out, extreme fatigue that is causing difficulty functioning and not losing any weight.  This is the reasons that she wants to increase dosage.  Pt would like for you to consider increasing dosage.  Pt states that she is also "fighting with allergies and asthma" and is really struggling to breath the last few days.  Pt is requesting refill of nebulizer medication.  Charyl Bigger, CMA

## 2021-05-05 NOTE — Progress Notes (Signed)
Chronic Care Management Pharmacy Assistant   Name: Tanya Hogan  MRN: [id number] DOB: 1951/07/28  Tanya Hogan is an 70 y.o. year old female who presents for his initial CCM visit with the clinical pharmacist.   Recent office visits:  04/16/21 Hali Marry MD - Seen for diabetes - Labs ordered - No medication changes noted - Follow up in 3-4 months  02/25/21 Hali Marry MD - Seen for medicare wellness exam - Referral for bone density - No medication changes noted - follow up in 1 year  01/14/21 Hali Marry MD - Seen for diabetes - Labs ordered - Follow up in 3 onths     Recent consult visits:  04/26/21 [facility] - Seen for IBS - No medication changes noted - No follow up noted  04/21/21 [facility] - Seen for IBS - No medication changes noted - No follow up noted - No Medication changes - Follow up in 6 months 03/26/21 [facility] - Seen for ductal carcinoma in situ - No medication changes noted - Follow up in 6 months  03/12/21 [facility] - Seen for epistaxis - No medication changes notes - No follow up noted  03/11/21 [facility] - consult visit - No notes available  02/20/21 gastroenterologist - Seen for IBS - Start lower dose of linzess 72 Mcg 2 times daily - Follow up in 2 months  02/04/21 Radiology - Seen for IBS - No medication changes notes - No follow up noted  12/04/20 Radiology - Seen for MRI scan - No medication changes notes - No follow up noted  11/29/20 Evans Lance MD - Cardiology - Seen for complete heart block - No medication changes notes - No follow up noted  11/25/20 Satira Mccallum MD - Radiology - Seen for SOB - No medication changes notes - No follow up noted  11/20/20 Jacki Cones MD - Gastroenterologist - Seen for IBS - Start Trulance - Follow up in 4 months      Hospital visits:  Medication Reconciliation was completed by comparing discharge summary,  patient's EMR and Pharmacy list, and upon discussion with patient.  Admitted to the hospital on 04/07/21 due to Medication administered in error. Discharge date was 04/08/21. Discharged from [facility] Emergency department .  New?Medications Started at [facility] Discharge:?? N/a  Medication Changes at Hospital Discharge: N/a  Medications Discontinued at Hospital Discharge: N/a  Medications that remain the same after Hospital Discharge:??  -All other medications will remain the same.     Admitted to the hospital on 03/09/21 due to Acute anterior epistaxis. Discharge date was 03/10/21. Discharged from [place]?Medications Started at [facility] Discharge:?? N/a  Medication Changes at Hospital Discharge: N/a  Medications Discontinued at Hospital Discharge: N/a  Medications that remain the same after Hospital Discharge:??  -All other medications will remain the same.    Admitted to the hospital on 03/08/21 due to epistaxis. Discharge date was 03/08/21. Discharged from [facility].  New?Medications Started at [facility] Discharge:?? N/a  Medication Changes at Hospital Discharge: N/a  Medications Discontinued at Hospital Discharge: N/a  Medications that remain the same after Hospital Discharge:??  -All other medications will remain the same.    Medications: Outpatient Encounter Medications as of 05/05/2021  Medication Sig   Accu-Chek FastClix Lancets MISC Apply topically.   alendronate (FOSAMAX) 70 MG tablet Take 1 tablet (70 mg  total) by mouth every 7 (seven) days. Take with a full glass of water on an empty stomach.   AMBULATORY NON FORMULARY MEDICATION Medication Name: Tubing and supplies for nebulizer. Fax - [phone]   amiodarone (PACERONE) 200 MG tablet TAKE ONE-HALF TABLET BY  MOUTH DAILY   blood glucose meter kit and supplies KIT Dispense based on patient and insurance preference.   bumetanide (BUMEX) 1 MG tablet TAKE 1 TABLET BY MOUTH   TWICE DAILY   clotrimazole-betamethasone (LOTRISONE) cream    dicyclomine (BENTYL) 20 MG tablet Take by mouth.   diltiazem (CARDIZEM CD) 180 MG 24 hr capsule TAKE 1 CAPSULE BY MOUTH  DAILY   EPIPEN 2-PAK 0.3 MG/0.3ML SOAJ injection INJECT 1 PEN INTO MUSCLE OF LATERAL THIGH FOR SEVERE ALLERGIC REACTION. CALL 911 AFTER USING THIS MEDICATION.   fexofenadine-pseudoephedrine (ALLEGRA-D 24) 180-240 MG 24 hr tablet Take 1 tablet by mouth daily.   fluticasone-salmeterol (ADVAIR HFA) 115-21 MCG/ACT inhaler Inhale 2 puffs into the lungs 2 (two) times daily.   glucose blood (ACCU-CHEK GUIDE) test strip Dx DM E11.21 - Check fasting blood sugar every morning before breakfast and before lunch and dinner. 3 times daily   insulin glargine (LANTUS SOLOSTAR) 100 UNIT/ML Solostar Pen Inject 40 Units into the skin at bedtime. (Patient taking differently: Inject 40 Units into the skin at bedtime. Takes 20 units at bedtime.)   insulin lispro (HUMALOG KWIKPEN) 100 UNIT/ML KwikPen Inject 3-12 Units into the skin 3 (three) times daily as needed (hyperglycemia>150). Sliding scale. Administer subQ within 15 minutes before or immediately after a meal   Insulin Pen Needle 31G X 8 MM MISC To be used for injecting insulin Dx: E11.8   Lancets Misc. (ACCU-CHEK FASTCLIX LANCET) KIT E11.21 Dx DM Check fasting blood sugar every morning before breakfast and before lunch and dinner. 3 times daily   levothyroxine (SYNTHROID) 75 MCG tablet TAKE 1 TABLET BY MOUTH  DAILY   LINZESS 72 MCG capsule Take 72 mcg by mouth 2 (two) times daily.   Multiple Vitamins-Minerals (OCUVITE EYE + MULTI) TABS Take 1 tablet by mouth daily.   Olopatadine HCl 0.2 % SOLN Place 1 drop into both eyes daily.   pantoprazole (PROTONIX) 40 MG tablet Take 40 mg by mouth daily.   pravastatin (PRAVACHOL) 40 MG tablet Take 1 tablet (40 mg total) by mouth at bedtime.   PROAIR HFA 108 (90 Base) MCG/ACT inhaler USE 1 TO 2 INHALATIONS BY  MOUTH EVERY 6 HOURS AS  NEEDED FOR  WHEEZING OR  SHORTNESS OF BREATH   RESTASIS 0.05 % ophthalmic emulsion    Tiotropium Bromide Monohydrate 2.5 MCG/ACT AERS Inhale 2 puffs into the lungs daily.   valACYclovir (VALTREX) 1000 MG tablet TAKE 1 TABLET BY MOUTH TWICE DAILY   No facility-administered encounter medications on file as of 05/05/2021.    Care Gaps: COVID-19 Vaccine  alendronate (FOSAMAX) 70 MG tablet - Last filled 04/15/21 84 DS amiodarone (PACERONE) 200 MG tablet - Last filled 04/15/21 90 DS bumetanide (BUMEX) 1 MG tablet - Last filled 03/14/21 90 DS clotrimazole-betamethasone (LOTRISONE) cream - Last filled 02/25/21 DS unknown  dicyclomine (BENTYL) 20 MG tablet - Last filled 12/25/19 40 DS diltiazem (CARDIZEM CD) 180 MG 24 hr capsule - Last filled 02/28/21 90 DS EPIPEN 2-PAK 0.3 MG/0.3ML SOAJ injection - Last filled 04/18/20 15 DS fexofenadine-pseudoephedrine (ALLEGRA-D 24) 180-240 MG 24 hr tablet - Last filled 10/23/20 DS unknown  fluticasone-salmeterol (ADVAIR HFA) 115-21 MCG/ACT inhaler (Expired) - Last filled 11/04/20 90 DS insulin glargine (  LANTUS SOLOSTAR) 100 UNIT/ML Solostar Pen - Last filled 03/21/21 113 DS insulin lispro (HUMALOG KWIKPEN) 100 UNIT/ML KwikPen - Last filled 08/15/20 25 DS levothyroxine (SYNTHROID) 75 MCG tablet - Last filled 03/14/21 90 DS LINZESS 72 MCG capsule - Last filled 02/25/21 DS unknown  Multiple Vitamins-Minerals (OCUVITE EYE + MULTI) TABS - Last filled  Olopatadine HCl 0.2 % SOLN - Last filled 04/15/21 90 DS pantoprazole (PROTONIX) 40 MG tablet - Last filled 07/29/20 90 DS pravastatin (PRAVACHOL) 40 MG tablet - Last filled 03/14/21 90 DS PROAIR HFA 108 (90 Base) MCG/ACT inhaler - Last filled 09/30/20 unknown RESTASIS 0.05 % ophthalmic emulsion - Last filled 09/09/18 unknown Tiotropium Bromide Monohydrate 2.5 MCG/ACT AERS (Expired) - Last filled 11/29/20 DS unknown valACYclovir (VALTREX) 1000 MG tablet - Last filled 09/04/20 7 DS    Star Rating Drugs: pravastatin (PRAVACHOL) 40 MG  tablet - Last filled 03/14/21 90 DS   Andee Poles, CMA

## 2021-05-05 NOTE — Telephone Encounter (Signed)
OK, increase dose by an extra half a tab 1 day/week.  So for example on Saturday she can take 1-1/2 tabs.  The other 6 days a week take a whole tab.  Continue this pattern weekly.  And then we will recheck the level in 6 weeks.

## 2021-05-06 NOTE — Telephone Encounter (Signed)
Pt informed.  Pt expressed understanding and is agreeable.  T. Elianne Gubser, CMA  

## 2021-05-07 ENCOUNTER — Ambulatory Visit: Payer: Medicare Other

## 2021-05-07 DIAGNOSIS — E119 Type 2 diabetes mellitus without complications: Secondary | ICD-10-CM | POA: Diagnosis not present

## 2021-05-07 DIAGNOSIS — Z888 Allergy status to other drugs, medicaments and biological substances status: Secondary | ICD-10-CM | POA: Diagnosis not present

## 2021-05-07 DIAGNOSIS — I2510 Atherosclerotic heart disease of native coronary artery without angina pectoris: Secondary | ICD-10-CM | POA: Diagnosis not present

## 2021-05-07 DIAGNOSIS — J449 Chronic obstructive pulmonary disease, unspecified: Secondary | ICD-10-CM | POA: Diagnosis not present

## 2021-05-07 DIAGNOSIS — R918 Other nonspecific abnormal finding of lung field: Secondary | ICD-10-CM | POA: Diagnosis not present

## 2021-05-07 DIAGNOSIS — Z91011 Allergy to milk products: Secondary | ICD-10-CM | POA: Diagnosis not present

## 2021-05-07 DIAGNOSIS — Z794 Long term (current) use of insulin: Secondary | ICD-10-CM | POA: Diagnosis not present

## 2021-05-07 DIAGNOSIS — Z91048 Other nonmedicinal substance allergy status: Secondary | ICD-10-CM | POA: Diagnosis not present

## 2021-05-07 DIAGNOSIS — U071 COVID-19: Secondary | ICD-10-CM | POA: Diagnosis not present

## 2021-05-07 DIAGNOSIS — I517 Cardiomegaly: Secondary | ICD-10-CM | POA: Diagnosis not present

## 2021-05-07 DIAGNOSIS — Z882 Allergy status to sulfonamides status: Secondary | ICD-10-CM | POA: Diagnosis not present

## 2021-05-07 DIAGNOSIS — I493 Ventricular premature depolarization: Secondary | ICD-10-CM | POA: Diagnosis not present

## 2021-05-07 DIAGNOSIS — Z8616 Personal history of COVID-19: Secondary | ICD-10-CM | POA: Diagnosis not present

## 2021-05-07 DIAGNOSIS — Z79899 Other long term (current) drug therapy: Secondary | ICD-10-CM | POA: Diagnosis not present

## 2021-05-07 DIAGNOSIS — Z880 Allergy status to penicillin: Secondary | ICD-10-CM | POA: Diagnosis not present

## 2021-05-07 DIAGNOSIS — I10 Essential (primary) hypertension: Secondary | ICD-10-CM | POA: Diagnosis not present

## 2021-05-07 DIAGNOSIS — Z950 Presence of cardiac pacemaker: Secondary | ICD-10-CM | POA: Diagnosis not present

## 2021-05-07 DIAGNOSIS — R0602 Shortness of breath: Secondary | ICD-10-CM | POA: Diagnosis not present

## 2021-05-07 DIAGNOSIS — Z91018 Allergy to other foods: Secondary | ICD-10-CM | POA: Diagnosis not present

## 2021-05-07 DIAGNOSIS — K219 Gastro-esophageal reflux disease without esophagitis: Secondary | ICD-10-CM | POA: Diagnosis not present

## 2021-05-07 DIAGNOSIS — Z87891 Personal history of nicotine dependence: Secondary | ICD-10-CM | POA: Diagnosis not present

## 2021-05-09 ENCOUNTER — Telehealth: Payer: Self-pay

## 2021-05-09 NOTE — Telephone Encounter (Signed)
Please call to schedule patient for a virtual HFU on Tuesday. Thanks!!!!!

## 2021-05-13 ENCOUNTER — Telehealth (INDEPENDENT_AMBULATORY_CARE_PROVIDER_SITE_OTHER): Payer: Medicare Other | Admitting: Family Medicine

## 2021-05-13 ENCOUNTER — Encounter: Payer: Self-pay | Admitting: Family Medicine

## 2021-05-13 VITALS — HR 88

## 2021-05-13 DIAGNOSIS — U071 COVID-19: Secondary | ICD-10-CM

## 2021-05-13 DIAGNOSIS — J4541 Moderate persistent asthma with (acute) exacerbation: Secondary | ICD-10-CM

## 2021-05-13 MED ORDER — LEVALBUTEROL HCL 1.25 MG/3ML IN NEBU: 1.2500 mg | INHALATION_SOLUTION | Freq: Four times a day (QID) | RESPIRATORY_TRACT | 0 refills | Status: DC | PRN

## 2021-05-13 NOTE — Progress Notes (Signed)
Virtual Visit via Telephone Note  I connected with Tanya Hogan on 05/13/21 at  9:30 AM EDT by telephone and verified that I am speaking with the correct person using two identifiers.   I discussed the limitations, risks, security and privacy concerns of performing an evaluation and management service by telephone and the availability of in person appointments. I also discussed with the patient that there may be a patient responsible charge related to this service. The patient expressed understanding and agreed to proceed.  Patient location: Provider loccation: In office   Subjective:    CC: COVID +   HPI: She became really SOB last weekend and ended up going to the ED on 9/14 and dx with COVID 19.  She had already had a cough for almost 2 weeks before she went to the ED.  She thought maybe it was a side effect of the flu shot.  Has been using her Nebulizer in AM to help get her phlegm up.  Also started her Mucinex and that has been helping  she is breathing much better. Mucous with thick white and is now clear. No fever but some hot flashes.  Dec appetite.  Sugars are doing much better. They were high but better in the last couple of days.  Had some watery stools.  Wants to know when can get out of quarantine.    Past medical history, Surgical history, Family history not pertinant except as noted below, Social history, Allergies, and medications have been entered into the medical record, reviewed, and corrections made.   Review of Systems: No fevers, chills, night sweats, weight loss, chest pain, or shortness of breath.   Objective:    General: Speaking clearly in complete sentences without any shortness of breath.  Alert and oriented x3.  Normal judgment. No apparent acute distress.    Impression and Recommendations:    COVID 19 - continue symptomatic care.  She is feeling much better but not 100% better.  Call if feeling worse.    Asthma flare - says went through almost an  entire inhaler in the  last week.  She has been using a spacer.  She is doing much better.  She will check on the Xopenex neb vials and see if needs a refill and will let us know.    She would also like to get back on Keesha, our clinical pharmacist schedule again she was unable to make her appointment because she has been sick these last several weeks.  I will get her rescheduled.   I discussed the assessment and treatment plan with the patient. The patient was provided an opportunity to ask questions and all were answered. The patient agreed with the plan and demonstrated an understanding of the instructions.   The patient was advised to call back or seek an in-person evaluation if the symptoms worsen or if the condition fails to improve as anticipated.  I provided 22 minutes of non-face-to-face time during this encounter.   Beatrice Lecher, MD

## 2021-05-14 ENCOUNTER — Other Ambulatory Visit: Payer: Self-pay

## 2021-05-14 MED ORDER — LEVALBUTEROL HCL 1.25 MG/3ML IN NEBU: 1.2500 mg | INHALATION_SOLUTION | Freq: Four times a day (QID) | RESPIRATORY_TRACT | 0 refills | Status: DC | PRN

## 2021-05-20 ENCOUNTER — Telehealth: Payer: Medicare Other

## 2021-05-21 ENCOUNTER — Ambulatory Visit (INDEPENDENT_AMBULATORY_CARE_PROVIDER_SITE_OTHER): Payer: Medicare Other | Admitting: Pharmacist

## 2021-05-21 ENCOUNTER — Other Ambulatory Visit: Payer: Self-pay

## 2021-05-21 DIAGNOSIS — I700 Atherosclerosis of aorta: Secondary | ICD-10-CM

## 2021-05-21 DIAGNOSIS — E118 Type 2 diabetes mellitus with unspecified complications: Secondary | ICD-10-CM

## 2021-05-21 DIAGNOSIS — I10 Essential (primary) hypertension: Secondary | ICD-10-CM

## 2021-05-21 NOTE — Progress Notes (Signed)
Chronic Care Management Pharmacy Note  05/21/2021 Name:  Tanya Hogan MRN:  [id number] DOB:  May 06, 1951  Summary: addressed HTN, HLD, and primarily DM.  Recommendations/Changes made from today's visit: Recommended patient initiate ozempic 0.77m weekly, pending collaboration with PCP. Will also provide novonordisk application for cost assistance with BOTH GLP1 + insulin to ease financial burden. Pt also requests CGM as she states she is tired of pricking her fingers and they are growing very sore, will submit DME application to see if CGM will be covered under insurance  Plan: f/u with pharmacist in 1 month  Subjective: Tanya ROMANSKIis an 70y.o. year old female who is a primary patient of [name], CRene Kocher MD.  The CCM team was consulted for assistance with disease management and care coordination needs.    Engaged with patient face to face for initial visit in response to provider referral for pharmacy case management and/or care coordination services.   Consent to Services:  The patient was given information about Chronic Care Management services, agreed to services, and gave verbal consent prior to initiation of services.  Please see initial visit note for detailed documentation.   Patient Care Team: MHali Marry MD as PCP - General (Family Medicine) KMarcy Panning MD (Hematology and Oncology) CLelon Perla MD as Attending Physician (Cardiology) TEvans Lance MD (Cardiology) Dr. LCaprice Kluver(Otolaryngology) FAmada Kingfisher MD as Consulting Physician (Hematology and Oncology) AGerre Pebbles MD as Referring Physician (Gastroenterology) FHaynes Dage OD as Referring Physician (Optometry) KDarius Bump RS[facility] Campusas Pharmacist (Pharmacist)   Objective:  Lab Results  Component Value Date   CREATININE 0.94 04/16/2021   CREATININE 0.89 03/26/2020   CREATININE 0.82 10/20/2019    Lab Results  Component Value Date   HGBA1C 6.4 (A) 04/16/2021   Last  diabetic Eye exam:  Lab Results  Component Value Date/Time   HMDIABEYEEXA No Retinopathy 05/06/2020 12:00 AM    Last diabetic Foot exam: No results found for: HMDIABFOOTEX      Component Value Date/Time   CHOL 168 04/16/2021 0000   TRIG 120 04/16/2021 0000   HDL 73 04/16/2021 0000   CHOLHDL 2.3 04/16/2021 0000   VLDL 26 02/18/2017 0939   LDLCALC 74 04/16/2021 0000    Hepatic Function Latest Ref Rng & Units 04/16/2021 03/26/2020 10/20/2019  Total Protein 6.1 - 8.1 g/dL 7.1 6.6 6.4  Albumin 3.5 - 5.0 g/dL - - -  AST 10 - 35 U/L 22 20 19   ALT 6 - 29 U/L 18 16 15   Alk Phosphatase 38 - 126 U/L - - -  Total Bilirubin 0.2 - 1.2 mg/dL 0.4 0.4 0.3  Bilirubin, Direct 0.0 - 0.3 mg/dL - - -    Lab Results  Component Value Date/Time   TSH 4.90 (H) 04/16/2021 12:00 AM   TSH 1.27 01/09/2020 02:31 PM    CBC Latest Ref Rng & Units 04/16/2021 03/14/2019 12/28/2018  WBC 3.8 - 10.8 Thousand/uL 5.4 6.5 7.8  Hemoglobin 11.7 - 15.5 g/dL 12.2 14.1 15.0  Hematocrit 35.0 - 45.0 % 38.0 42.7 45.2(H)  Platelets 140 - 400 Thousand/uL 299 287 357    Lab Results  Component Value Date/Time   VD25OH 65 05/04/2014 02:27 PM   VD25OH 61 12/29/2010 08:05 AM    Clinical ASCVD: Yes  The 10-year ASCVD risk score (Arnett DK, et al., 2019) is: 28%   Values used to calculate the score:     Age: 70years  Sex: Female     Is Non-Hispanic African American: No     Diabetic: Yes     Tobacco smoker: No     Systolic Blood Pressure: 811 mmHg     Is BP treated: Yes     HDL Cholesterol: 73 mg/dL     Total Cholesterol: 168 mg/dL    Other: (CHADS2VASc if Afib, PHQ9 if depression, MMRC or CAT for COPD, ACT, DEXA)  Social History   Tobacco Use  Smoking Status Former   Packs/day: 0.00   Years: 0.00   Pack years: 0.00   Types: Cigarettes   Quit date: 08/24/1966   Years since quitting: 54.7  Smokeless Tobacco Never   BP Readings from Last 3 Encounters:  04/16/21 (!) 132/57  03/08/21 (!) 154/78  02/25/21 (!)  122/56   Pulse Readings from Last 3 Encounters:  05/13/21 88  04/16/21 88  03/08/21 83   Wt Readings from Last 3 Encounters:  04/16/21 219 lb (99.3 kg)  03/08/21 215 lb (97.5 kg)  02/25/21 220 lb 1.9 oz (99.8 kg)    Assessment: Review of patient past medical history, allergies, medications, health status, including review of consultants reports, laboratory and other test data, was performed as part of comprehensive evaluation and provision of chronic care management services.   SDOH:  (Social Determinants of Health) assessments and interventions performed:    CCM Care Plan  Allergies  Allergen Reactions   Penicillins Anaphylaxis    Has patient had a PCN reaction causing immediate rash, facial/tongue/throat swelling, SOB or lightheadedness with hypotension: Yes Has patient had a PCN reaction causing severe rash involving mucus membranes or skin necrosis: No Has patient had a PCN reaction that required hospitalization: No Has patient had a PCN reaction occurring within the last 10 years: No If all of the above answers are "NO", then may proceed with Cephalosporin use.    Accupril [Quinapril Hcl] Other (See Comments)    Hyperkalemia   Atorvastatin Other (See Comments)    msucle aches   Celebrex [Celecoxib]     ulcers   Fluticasone Other (See Comments)    Nasal ulcer.     Levocetirizine     Doesn't recall   Levomenol Other (See Comments)    unknown unknown    Losartan Potassium Itching   Naproxen Swelling   Nasonex [Mometasone Furoate] Other (See Comments)    nosebleed   Chamomile Other (See Comments)    unknown   Vesicare [Solifenacin] Other (See Comments)    "stopped me from urinating"   Aspirin Other (See Comments)    Pt states "burns holes in stomach", ulcers    Baking Soda-Fluoride [Sodium Fluoride] Itching   Clindamycin/Lincomycin Rash   Codeine Nausea And Vomiting   Furosemide Other (See Comments)    Nose bleed   Lactose    Lactose Intolerance (Gi) Other  (See Comments)    Increased calcium, sneezing, nasal issues   Lanolin-Petrolatum Rash   Metformin And Related Diarrhea   Miralax [Polyethylene Glycol 3350]    Miralax [Polyethylene Glycol] Rash   Nasonex [Mometasone Furoate] Other (See Comments)   Quinine Rash   Rifampin Other (See Comments)    DOES NOT REMEMBER THIS MEDICATION OR ALLERGY   Sulfa Antibiotics Rash   Sulfonamide Derivatives Rash   Tramadol Rash    Anxiety   Wellbutrin [Bupropion] Other (See Comments)    Muscle aches, pain, "made my skin crawl"   Wool Alcohol [Lanolin] Rash   Zoledronic Acid Other (See Comments) and Rash  shaking    Medications Reviewed Today     Reviewed by Hali Marry, MD (Physician) on 05/13/21 at [phone]  Med List Status: <None>   Medication Order Taking? Sig Documenting Provider Last Dose Status Informant  Accu-Chek FastClix Lancets MISC [id number] No Apply topically. [provider] Taking Active   alendronate (FOSAMAX) 70 MG tablet [id number] No Take 1 tablet (70 mg total) by mouth every 7 (seven) days. Take with a full glass of water on an empty stomach. Hali Marry, MD Taking Active   Camillo Flaming MEDICATION [id number] No Medication Name: Tubing and supplies for nebulizer. Fax - [phone] Hali Marry, MD Taking Active   amiodarone (PACERONE) 200 MG tablet [id number] No TAKE ONE-HALF TABLET BY  MOUTH DAILY Evans Lance, MD Taking Active   blood glucose meter kit and supplies KIT [id number] No Dispense based on patient and insurance preference. Hali Marry, MD Taking Active   bumetanide [facility]) 1 MG tablet [id number] No TAKE 1 TABLET BY MOUTH  TWICE DAILY Hali Marry, MD Taking Active   clotrimazole-betamethasone (LOTRISONE) cream [id number] No  [provider] Taking Active   dicyclomine (BENTYL) 20 MG tablet [id number] No Take by mouth. [provider] Taking Active   diltiazem (CARDIZEM CD) 180 MG 24 hr  capsule [id number] No TAKE 1 CAPSULE BY MOUTH  DAILY Hali Marry, MD Taking Active   EPIPEN 2-PAK 0.3 MG/0.3ML SOAJ injection [id number] No INJECT 1 PEN INTO MUSCLE OF LATERAL THIGH FOR SEVERE ALLERGIC REACTION. CALL 911 AFTER USING THIS MEDICATION. Hali Marry, MD Taking Active   fexofenadine-pseudoephedrine (ALLEGRA-D 24) 180-240 MG 24 hr tablet [id number] No Take 1 tablet by mouth daily. [provider] Taking Active Self  fluticasone-salmeterol (ADVAIR HFA) 967-89 MCG/ACT inhaler [id number] No Inhale 2 puffs into the lungs 2 (two) times daily. [provider] Taking Expired 02/25/21 2359   glucose blood (ACCU-CHEK GUIDE) test strip [id number] No Dx DM E11.21 - Check fasting blood sugar every morning before breakfast and before lunch and dinner. 3 times daily Hali Marry, MD Taking Active   insulin glargine (LANTUS SOLOSTAR) 100 UNIT/ML Solostar Pen [id number] No Inject 40 Units into the skin at bedtime.  Patient taking differently: Inject 40 Units into the skin at bedtime. Takes 20 units at bedtime.   Hali Marry, MD Taking Active   insulin lispro (HUMALOG KWIKPEN) 100 UNIT/ML KwikPen [id number] No Inject 3-12 Units into the skin 3 (three) times daily as needed (hyperglycemia>150). Sliding scale. Administer subQ within 15 minutes before or immediately after a meal Hali Marry, MD Taking Active   Insulin Pen Needle 31G X 8 MM MISC [id number] No To be used for injecting insulin Dx: E11.8 Hali Marry, MD Taking Active   Lancets Misc. (ACCU-CHEK FASTCLIX LANCET) KIT [id number] No E11.21 Dx DM Check fasting blood sugar every morning before breakfast and before lunch and dinner. 3 times daily Hali Marry, MD Taking Active   levalbuterol Penne Lash) 1.25 MG/3ML nebulizer solution [id number] Yes Take 1.25 mg by nebulization every 6 (six) hours as needed for wheezing. Hali Marry, MD  Active   levothyroxine (SYNTHROID) 75  MCG tablet [id number] No TAKE 1 TABLET BY MOUTH  DAILY Hali Marry, MD Taking Active   LINZESS 72 MCG capsule [id number] No Take 72 mcg by mouth 2 (two) times daily. [provider] Taking Active   Multiple Vitamins-Minerals (OCUVITE EYE + MULTI) TABS [id number] No Take 1 tablet by mouth daily.  [provider] Taking Active Self  Olopatadine HCl 0.2 % SOLN [id number] No Place 1 drop into both eyes daily. [provider] Taking Active   pantoprazole (PROTONIX) 40 MG tablet [id number] No Take 40 mg by mouth daily. [provider] Taking Active   pravastatin (PRAVACHOL) 40 MG tablet [id number] No Take 1 tablet (40 mg total) by mouth at bedtime. Hali Marry, MD Taking Active   PROAIR HFA [phone] Base) MCG/ACT inhaler [id number] No USE 1 TO 2 INHALATIONS BY  MOUTH EVERY 6 HOURS AS  NEEDED FOR WHEEZING OR  SHORTNESS OF BREATH Hali Marry, MD Taking Active   RESTASIS 0.05 % ophthalmic emulsion [id number] No  [provider] Taking Active   Tiotropium Bromide Monohydrate 2.5 MCG/ACT AERS [id number] No Inhale 2 puffs into the lungs daily. [provider] Taking Expired 02/25/21 2359   valACYclovir (VALTREX) 1000 MG tablet [id number] No TAKE 1 TABLET BY MOUTH TWICE DAILY Hali Marry, MD Taking Active             Patient Active Problem List   Diagnosis Date Noted   Osteoporosis 02/28/2021   Primary osteoarthritis of left knee 01/14/2021   Irritable bowel syndrome with constipation 09/16/2020   Aortic atherosclerosis ([place]) 06/11/2020   Benign lipomatous neoplasm of skin and subcutaneous tissue of left leg 06/11/2020   Paroxysmal VT ([place]) 02/14/2020   Chronic constipation 10/06/2019   Moderate persistent asthma with exacerbation 08/29/2019   Genetic testing 08/14/2019   Ductal carcinoma in situ (DCIS) of right breast 06/15/2019   Arthralgia 04/11/2019   Malignant tumor of breast ([place]) 03/06/2019   Fatigue 03/06/2019    Status post total shoulder arthroplasty, right 03/10/2018   DDD (degenerative disc disease), cervical 11/19/2017   Bradycardia with less than 60 beats per minute 11/02/2017   Peripheral edema 05/19/2017   Microcytic anemia 05/14/2017   Delayed sleep phase syndrome 10/29/2016   Primary osteoarthritis of left ankle 09/24/2016   Gastrointestinal food sensitivity 05/21/2016   History of arthroplasty of right shoulder 02/24/2016   Bilateral foot pain 01/22/2016   NASH (nonalcoholic steatohepatitis) 05/26/2015    Class: Stage 3   Nasal septal perforation 07/11/2014   Controlled diabetes mellitus type 2 with complications ([place]) 00/51/1021   Physical deconditioning 04/24/2014   Obesity, Class II, BMI 35-39.9, with comorbidity 06/09/2013   OSA (obstructive sleep apnea) 11/73/5670   Chronic systolic heart failure ([place]) 05/23/2013   Asthma, moderate persistent 04/18/2013   Chronic cough 04/18/2013   Breast cancer of upper-inner quadrant of left female breast ([place]) 03/18/2010   Hyperlipidemia 11/07/2009   Fibromyalgia 10/29/2009   Complete heart block ([place]) s/p MDT BiVP 10/23/2009   PACEMAKER, PERMANENT 10/23/2009   ALKALINE PHOSPHATASE, ELEVATED 10/13/2009   Hypothyroidism 10/03/2009   Benign essential hypertension 07/02/2009   Chronic back pain 07/02/2009    Immunization History  Administered Date(s) Administered   Fluad Quad(high Dose 65+) 04/11/2019, 05/02/2020   Influenza Split 05/10/2012, 05/04/2014   Influenza Whole 06/14/2009, 05/08/2010, 04/24/2011   Influenza, High Dose Seasonal PF 05/17/2017, 05/24/2018, 04/11/2019, 05/02/2020   Influenza,inj,Quad PF,6+ Mos 04/28/2013, 05/04/2014, 05/06/2015, 05/18/2016   Moderna Sars-Covid-2 Vaccination 11/01/2019, 11/29/2019, 07/19/2020   Pneumococcal Conjugate-13 05/25/2014   Pneumococcal Polysaccharide-23 11/28/2012, 12/28/2018   Td 08/24/2004, 03/07/2010   Tdap 07/14/2012   Zoster Recombinat (Shingrix) 10/06/2017, 01/16/2020    Zoster, Live 04/24/2011    Conditions to be addressed/monitored: HTN, HLD, and DMII  There are no care plans that you recently modified to display for  this patient.   Medication Assistance:  Pending  Patient's preferred pharmacy is:  [id number] - [place], [place] - [address] AT [place] [address] [place] [address] Phone: [phone] Fax: [phone]  [facility]  ([place], [place] [facility] [place] [address] [address] Phone: [phone] Fax: [place] [phone] - [place], [place] [address] [place] [address] Phone: [phone] Fax: [phone]  [facility] ([facility]) - [place], [place] [address] Phone: [phone] Fax: [phone]  Uses pill box? Yes Pt endorses 100% compliance  Follow Up:  Patient agrees to Care Plan and Follow-up.  Plan: Telephone follow up appointment with care management team member scheduled for:  1 month  Darius Bump

## 2021-05-22 NOTE — Patient Instructions (Signed)
Visit Information   PATIENT GOALS:   Goals Addressed             This Visit's Progress    Medication Management       Patient Goals/Self-Care Activities Over the next 30 days, patient will:  take medications as prescribed  Follow Up Plan: Telephone follow up appointment with care management team member scheduled for:  1 month         Consent to CCM Services: Tanya Hogan was given information about Chronic Care Management services including:  CCM service includes personalized support from designated clinical staff supervised by her physician, including individualized plan of care and coordination with other care providers 24/7 contact phone numbers for assistance for urgent and routine care needs. Service will only be billed when office clinical staff spend 20 minutes or more in a month to coordinate care. Only one practitioner may furnish and bill the service in a calendar month. The patient may stop CCM services at any time (effective at the end of the month) by phone call to the office staff. The patient will be responsible for cost sharing (co-pay) of up to 20% of the service fee (after annual deductible is met).  Patient agreed to services and verbal consent obtained.   Patient verbalizes understanding of instructions provided today and agrees to view in [employer].   Telephone follow up appointment with care management team member scheduled for: 1 month  [place]: Patient Care Plan: Medication Management     Problem Identified: DM, HTN, HLD      Long-Range Goal: Disease Progression Prevention   Start Date: 05/21/2021  This Visit's Progress: On track  Priority: High  Note:   Current Barriers:  Unable to independently afford treatment regimen Pt describes falling into donut hole after first 3 months of the year, then June or July into catastrophic coverage. Stressful process for her.  Pharmacist Clinical Goal(s):  Over the next 30 days,  patient will verbalize ability to afford treatment regimen adhere to plan to optimize therapeutic regimen for diabetes as evidenced by report of adherence to recommended medication management changes through collaboration with PharmD and provider.   Interventions: 1:1 collaboration with Hali Marry, MD regarding development and update of comprehensive plan of care as evidenced by provider attestation and co-signature Inter-disciplinary care team collaboration (see longitudinal plan of care) Comprehensive medication review performed; medication list updated in electronic medical record  Diabetes:  Controlled; current treatment:lantus 20 units at bedtime, lispro 3-12 units TID (sliding scale), ;   Current glucose readings:  9/24: 149, 126 9/25: 135, 151 9/23: 216 (AM. Gave 5 units novolog) 9/21: 100 9/20: 120,168  Denies hypoglycemic/hyperglycemic symptoms  Current meal patterns: to be discussed at future visits  Current exercise: to be discussed at future visits  Counseled on goal BG values, answered pt questions about CGM vs traditional glucometer Recommended patient initiate ozempic 0.44m weekly, pending collaboration with PCP. Will also provide novonordisk application for cost assistance with BOTH GLP1 + insulin to ease financial burden. Pt also requests CGM as she states she is tired of pricking her fingers and they are growing very sore, will submit DME application to see if CGM will be covered under insurance,    Hypertension:  Controlled; current treatment:diltiazem 1872mdaily, ;   Current home readings: to be discussed at future visits  Denies hypotensive/hypertensive symptoms  Recommended continue current regimen, and  Hyperlipidemia:  Controlled; current treatment:pravastatin 4084maily;  Recommended continue current regimen  Patient Goals/Self-Care Activities Over the next 30 days, patient will:  take medications as prescribed  Follow Up Plan: Telephone  follow up appointment with care management team member scheduled for:  1 month

## 2021-05-23 DIAGNOSIS — E118 Type 2 diabetes mellitus with unspecified complications: Secondary | ICD-10-CM

## 2021-05-23 DIAGNOSIS — I10 Essential (primary) hypertension: Secondary | ICD-10-CM | POA: Diagnosis not present

## 2021-05-26 ENCOUNTER — Other Ambulatory Visit: Payer: Self-pay

## 2021-05-26 DIAGNOSIS — R739 Hyperglycemia, unspecified: Secondary | ICD-10-CM

## 2021-05-26 DIAGNOSIS — Z794 Long term (current) use of insulin: Secondary | ICD-10-CM

## 2021-05-26 DIAGNOSIS — E1121 Type 2 diabetes mellitus with diabetic nephropathy: Secondary | ICD-10-CM

## 2021-05-26 DIAGNOSIS — T50905A Adverse effect of unspecified drugs, medicaments and biological substances, initial encounter: Secondary | ICD-10-CM

## 2021-05-26 MED ORDER — ACCU-CHEK FASTCLIX LANCET KIT: PACK | 99 refills | Status: DC

## 2021-05-26 MED ORDER — ACCU-CHEK GUIDE VI STRP: ORAL_STRIP | 99 refills | Status: DC

## 2021-05-26 MED ORDER — INSULIN PEN NEEDLE 31G X 8 MM MISC: 4 refills | Status: DC

## 2021-05-27 ENCOUNTER — Other Ambulatory Visit: Payer: Self-pay | Admitting: *Deleted

## 2021-05-27 MED ORDER — ALBUTEROL SULFATE HFA 108 (90 BASE) MCG/ACT IN AERS: 1.0000 | INHALATION_SPRAY | Freq: Four times a day (QID) | RESPIRATORY_TRACT | 3 refills | Status: DC | PRN

## 2021-05-28 ENCOUNTER — Other Ambulatory Visit: Payer: Self-pay

## 2021-05-28 DIAGNOSIS — I700 Atherosclerosis of aorta: Secondary | ICD-10-CM

## 2021-05-28 MED ORDER — PRAVASTATIN SODIUM 40 MG PO TABS: 40.0000 mg | ORAL_TABLET | Freq: Every day | ORAL | 1 refills | Status: DC

## 2021-06-02 ENCOUNTER — Telehealth: Payer: Medicare Other

## 2021-06-02 ENCOUNTER — Ambulatory Visit (INDEPENDENT_AMBULATORY_CARE_PROVIDER_SITE_OTHER): Payer: Medicare Other | Admitting: Pharmacist

## 2021-06-02 ENCOUNTER — Other Ambulatory Visit: Payer: Self-pay

## 2021-06-02 DIAGNOSIS — D0511 Intraductal carcinoma in situ of right breast: Secondary | ICD-10-CM | POA: Diagnosis not present

## 2021-06-02 DIAGNOSIS — M79671 Pain in right foot: Secondary | ICD-10-CM | POA: Diagnosis not present

## 2021-06-02 DIAGNOSIS — L602 Onychogryphosis: Secondary | ICD-10-CM | POA: Diagnosis not present

## 2021-06-02 DIAGNOSIS — E038 Other specified hypothyroidism: Secondary | ICD-10-CM | POA: Diagnosis not present

## 2021-06-02 DIAGNOSIS — L84 Corns and callosities: Secondary | ICD-10-CM | POA: Diagnosis not present

## 2021-06-02 DIAGNOSIS — Z794 Long term (current) use of insulin: Secondary | ICD-10-CM

## 2021-06-02 DIAGNOSIS — I700 Atherosclerosis of aorta: Secondary | ICD-10-CM

## 2021-06-02 DIAGNOSIS — I10 Essential (primary) hypertension: Secondary | ICD-10-CM

## 2021-06-02 DIAGNOSIS — E1121 Type 2 diabetes mellitus with diabetic nephropathy: Secondary | ICD-10-CM

## 2021-06-02 DIAGNOSIS — E119 Type 2 diabetes mellitus without complications: Secondary | ICD-10-CM | POA: Diagnosis not present

## 2021-06-02 DIAGNOSIS — M79672 Pain in left foot: Secondary | ICD-10-CM | POA: Diagnosis not present

## 2021-06-02 NOTE — Patient Instructions (Signed)
Visit Information  PATIENT GOALS:  Goals Addressed             This Visit's Progress    Medication Management       Patient Goals/Self-Care Activities Over the next 30 days, patient will:  take medications as prescribed  Follow Up Plan: Telephone follow up appointment with care management team member scheduled for:  2 weeks        Patient verbalizes understanding of instructions provided today and agrees to view in [place].   Telephone follow up appointment with care management team member scheduled for: 2 weeks  Darius Bump

## 2021-06-02 NOTE — Progress Notes (Signed)
Chronic Care Management Pharmacy Note  06/02/2021 Name:  Tanya Hogan MRN:  [id number] DOB:  [birth year]  Summary: addressed HTN, HLD, and primarily DM. She has increased her insulin back to 25 units as she states her sugars "want to stay slightly elevated, about 130-140s."  Patient has not yet heard anything from ozempic patient assistance, but is in the process of working with DME company for free shipment of CGM!   Recommendations/Changes made from today's visit: Continue current regimen with insulin titration by 2 units every few days to goal of fasting BG <130. Recommended patient initiate ozempic 0.21m weekly. Initiating cost assistance for BOTH GLP1 + insulin to ease financial burden. Pharmacist will follow up with patient assistance application update.  Plan: f/u with pharmacist in 2 weeks  Subjective: Tanya KERCHEVALis an 70y.o. year old female who is a primary patient of [name], CRene Kocher MD.  The CCM team was consulted for assistance with disease management and care coordination needs.    Engaged with patient face to face for initial visit in response to provider referral for pharmacy case management and/or care coordination services.   Consent to Services:  The patient was given information about Chronic Care Management services, agreed to services, and gave verbal consent prior to initiation of services.  Please see initial visit note for detailed documentation.   Patient Care Team: MHali Marry MD as PCP - General (Family Medicine) KMarcy Panning MD (Hematology and Oncology) CLelon Perla MD as Attending Physician (Cardiology) TEvans Lance MD (Cardiology) Dr. LCaprice Kluver(Otolaryngology) FAmada Kingfisher MD as Consulting Physician (Hematology and Oncology) AGerre Pebbles MD as Referring Physician (Gastroenterology) FHaynes Dage OD as Referring Physician (Optometry) KDarius Bump RAtl[facility] Center LLCas Pharmacist (Pharmacist)   Objective:  Lab  Results  Component Value Date   CREATININE 0.94 04/16/2021   CREATININE 0.89 03/26/2020   CREATININE 0.82 10/20/2019    Lab Results  Component Value Date   HGBA1C 6.4 (A) 04/16/2021   Last diabetic Eye exam:  Lab Results  Component Value Date/Time   HMDIABEYEEXA No Retinopathy 05/06/2020 12:00 AM    Last diabetic Foot exam: No results found for: HMDIABFOOTEX      Component Value Date/Time   CHOL 168 04/16/2021 0000   TRIG 120 04/16/2021 0000   HDL 73 04/16/2021 0000   CHOLHDL 2.3 04/16/2021 0000   VLDL 26 02/18/2017 0939   LDLCALC 74 04/16/2021 0000    Hepatic Function Latest Ref Rng & Units 04/16/2021 03/26/2020 10/20/2019  Total Protein 6.1 - 8.1 g/dL 7.1 6.6 6.4  Albumin 3.5 - 5.0 g/dL - - -  AST 10 - 35 U/L _0 ALT 6 - 29 U/L _1 Alk Phosphatase 38 - 126 U/L - - -  Total Bilirubin 0.2 - 1.2 mg/dL 0.4 0.4 0.3  Bilirubin, Direct 0.0 - 0.3 mg/dL - - -    Lab Results  Component Value Date/Time   TSH 4.90 (H) 04/16/2021 12:00 AM   TSH 1.27 01/09/2020 02:31 PM    CBC Latest Ref Rng & Units 04/16/2021 03/14/2019 12/28/2018  WBC 3.8 - 10.8 Thousand/uL 5.4 6.5 7.8  Hemoglobin 11.7 - 15.5 g/dL 12.2 14.1 15.0  Hematocrit 35.0 - 45.0 % 38.0 42.7 45.2(H)  Platelets 140 - 400 Thousand/uL 299 287 357    Lab Results  Component Value Date/Time   VD25OH 65 05/04/2014 02:27 PM   VD25OH 61 12/29/2010 08:05 AM    Clinical  ASCVD: Yes  The 10-year ASCVD risk score (Arnett DK, et al., 2019) is: 28%   Values used to calculate the score:     Age: 70 years     Sex: Female     Is Non-Hispanic African American: No     Diabetic: Yes     Tobacco smoker: No     Systolic Blood Pressure: 314 mmHg     Is BP treated: Yes     HDL Cholesterol: 73 mg/dL     Total Cholesterol: 168 mg/dL    Other: (CHADS2VASc if Afib, PHQ9 if depression, MMRC or CAT for COPD, ACT, DEXA)  Social History   Tobacco Use  Smoking Status Former   Packs/day: 0.00   Years: 0.00   Pack years: 0.00    Types: Cigarettes   Quit date: 08/24/1966   Years since quitting: 54.8  Smokeless Tobacco Never   BP Readings from Last 3 Encounters:  04/16/21 (!) 132/57  03/08/21 (!) 154/78  02/25/21 (!) 122/56   Pulse Readings from Last 3 Encounters:  05/13/21 88  04/16/21 88  03/08/21 83   Wt Readings from Last 3 Encounters:  04/16/21 219 lb (99.3 kg)  03/08/21 215 lb (97.5 kg)  02/25/21 220 lb 1.9 oz (99.8 kg)    Assessment: Review of patient past medical history, allergies, medications, health status, including review of consultants reports, laboratory and other test data, was performed as part of comprehensive evaluation and provision of chronic care management services.   SDOH:  (Social Determinants of Health) assessments and interventions performed:    CCM Care Plan  Allergies  Allergen Reactions   Penicillins Anaphylaxis    Has patient had a PCN reaction causing immediate rash, facial/tongue/throat swelling, SOB or lightheadedness with hypotension: Yes Has patient had a PCN reaction causing severe rash involving mucus membranes or skin necrosis: No Has patient had a PCN reaction that required hospitalization: No Has patient had a PCN reaction occurring within the last 10 years: No If all of the above answers are "NO", then may proceed with Cephalosporin use.    Accupril [Quinapril Hcl] Other (See Comments)    Hyperkalemia   Atorvastatin Other (See Comments)    msucle aches   Celebrex [Celecoxib]     ulcers   Fluticasone Other (See Comments)    Nasal ulcer.     Levocetirizine     Doesn't recall   Levomenol Other (See Comments)    unknown unknown    Losartan Potassium Itching   Naproxen Swelling   Nasonex [Mometasone Furoate] Other (See Comments)    nosebleed   Chamomile Other (See Comments)    unknown   Vesicare [Solifenacin] Other (See Comments)    "stopped me from urinating"   Aspirin Other (See Comments)    Pt states "burns holes in stomach", ulcers    Baking  Soda-Fluoride [Sodium Fluoride] Itching   Clindamycin/Lincomycin Rash   Codeine Nausea And Vomiting   Furosemide Other (See Comments)    Nose bleed   Lactose    Lactose Intolerance (Gi) Other (See Comments)    Increased calcium, sneezing, nasal issues   Lanolin-Petrolatum Rash   Metformin And Related Diarrhea   Miralax [Polyethylene Glycol 3350]    Miralax [Polyethylene Glycol] Rash   Nasonex [Mometasone Furoate] Other (See Comments)   Quinine Rash   Rifampin Other (See Comments)    DOES NOT REMEMBER THIS MEDICATION OR ALLERGY   Sulfa Antibiotics Rash   Sulfonamide Derivatives Rash   Tramadol Rash  Anxiety   Wellbutrin [Bupropion] Other (See Comments)    Muscle aches, pain, "made my skin crawl"   Wool Alcohol [Lanolin] Rash   Zoledronic Acid Other (See Comments) and Rash    shaking    Medications Reviewed Today     Reviewed by Hali Marry, MD (Physician) on 05/13/21 at [phone]  Med List Status: <None>   Medication Order Taking? Sig Documenting Provider Last Dose Status Informant  Accu-Chek FastClix Lancets MISC [id number] No Apply topically. [provider] Taking Active   alendronate (FOSAMAX) 70 MG tablet [id number] No Take 1 tablet (70 mg total) by mouth every 7 (seven) days. Take with a full glass of water on an empty stomach. Hali Marry, MD Taking Active   Camillo Flaming MEDICATION [id number] No Medication Name: Tubing and supplies for nebulizer. Fax - [phone] Hali Marry, MD Taking Active   amiodarone (PACERONE) 200 MG tablet [id number] No TAKE ONE-HALF TABLET BY  MOUTH DAILY Evans Lance, MD Taking Active   blood glucose meter kit and supplies KIT [id number] No Dispense based on patient and insurance preference. Hali Marry, MD Taking Active   bumetanide [facility]) 1 MG tablet [id number] No TAKE 1 TABLET BY MOUTH  TWICE DAILY Hali Marry, MD Taking Active   clotrimazole-betamethasone (LOTRISONE) cream  [id number] No  [provider] Taking Active   dicyclomine (BENTYL) 20 MG tablet [id number] No Take by mouth. [provider] Taking Active   diltiazem (CARDIZEM CD) 180 MG 24 hr capsule [id number] No TAKE 1 CAPSULE BY MOUTH  DAILY Hali Marry, MD Taking Active   EPIPEN 2-PAK 0.3 MG/0.3ML SOAJ injection [id number] No INJECT 1 PEN INTO MUSCLE OF LATERAL THIGH FOR SEVERE ALLERGIC REACTION. CALL 911 AFTER USING THIS MEDICATION. Hali Marry, MD Taking Active   fexofenadine-pseudoephedrine (ALLEGRA-D 24) 180-240 MG 24 hr tablet [id number] No Take 1 tablet by mouth daily. [provider] Taking Active Self  fluticasone-salmeterol (ADVAIR HFA) 809-98 MCG/ACT inhaler [id number] No Inhale 2 puffs into the lungs 2 (two) times daily. [provider] Taking Expired 02/25/21 2359   glucose blood (ACCU-CHEK GUIDE) test strip [id number] No Dx DM E11.21 - Check fasting blood sugar every morning before breakfast and before lunch and dinner. 3 times daily Hali Marry, MD Taking Active   insulin glargine (LANTUS SOLOSTAR) 100 UNIT/ML Solostar Pen [id number] No Inject 40 Units into the skin at bedtime.  Patient taking differently: Inject 40 Units into the skin at bedtime. Takes 20 units at bedtime.   Hali Marry, MD Taking Active   insulin lispro (HUMALOG KWIKPEN) 100 UNIT/ML KwikPen [id number] No Inject 3-12 Units into the skin 3 (three) times daily as needed (hyperglycemia>150). Sliding scale. Administer subQ within 15 minutes before or immediately after a meal Hali Marry, MD Taking Active   Insulin Pen Needle 31G X 8 MM MISC [id number] No To be used for injecting insulin Dx: E11.8 Hali Marry, MD Taking Active   Lancets Misc. (ACCU-CHEK FASTCLIX LANCET) KIT [id number] No E11.21 Dx DM Check fasting blood sugar every morning before breakfast and before lunch and dinner. 3 times daily Hali Marry, MD Taking Active   levalbuterol  Penne Lash) 1.25 MG/3ML nebulizer solution [id number] Yes Take 1.25 mg by nebulization every 6 (six) hours as needed for wheezing. Hali Marry, MD  Active   levothyroxine (SYNTHROID) 75 MCG tablet [id number] No TAKE 1 TABLET BY MOUTH  DAILY Hali Marry, MD Taking Active   [facility]  72 MCG capsule [id number] No Take 72 mcg by mouth 2 (two) times daily. [provider] Taking Active   Multiple Vitamins-Minerals (OCUVITE EYE + MULTI) TABS [id number] No Take 1 tablet by mouth daily. [provider] Taking Active Self  Olopatadine HCl 0.2 % SOLN [id number] No Place 1 drop into both eyes daily. [provider] Taking Active   pantoprazole (PROTONIX) 40 MG tablet [id number] No Take 40 mg by mouth daily. [provider] Taking Active   pravastatin (PRAVACHOL) 40 MG tablet [id number] No Take 1 tablet (40 mg total) by mouth at bedtime. Hali Marry, MD Taking Active   PROAIR HFA 108 [phone] Base) MCG/ACT inhaler [id number] No USE 1 TO 2 INHALATIONS BY  MOUTH EVERY 6 HOURS AS  NEEDED FOR WHEEZING OR  SHORTNESS OF BREATH Hali Marry, MD Taking Active   RESTASIS 0.05 % ophthalmic emulsion [id number] No  [provider] Taking Active   Tiotropium Bromide Monohydrate 2.5 MCG/ACT AERS [id number] No Inhale 2 puffs into the lungs daily. [provider] Taking Expired 02/25/21 2359   valACYclovir (VALTREX) 1000 MG tablet [id number] No TAKE 1 TABLET BY MOUTH TWICE DAILY Hali Marry, MD Taking Active             Patient Active Problem List   Diagnosis Date Noted   Osteoporosis 02/28/2021   Primary osteoarthritis of left knee 01/14/2021   Irritable bowel syndrome with constipation 09/16/2020   Aortic atherosclerosis ([place]) 06/11/2020   Benign lipomatous neoplasm of skin and subcutaneous tissue of left leg 06/11/2020   Paroxysmal VT 02/14/2020   Chronic constipation 10/06/2019   Moderate persistent asthma with exacerbation  08/29/2019   Genetic testing 08/14/2019   Ductal carcinoma in situ (DCIS) of right breast 06/15/2019   Arthralgia 04/11/2019   Malignant tumor of breast ([place]) 03/06/2019   Fatigue 03/06/2019   Status post total shoulder arthroplasty, right 03/10/2018   DDD (degenerative disc disease), cervical 11/19/2017   Bradycardia with less than 60 beats per minute 11/02/2017   Peripheral edema 05/19/2017   Microcytic anemia 05/14/2017   Delayed sleep phase syndrome 10/29/2016   Primary osteoarthritis of left ankle 09/24/2016   Gastrointestinal food sensitivity 05/21/2016   History of arthroplasty of right shoulder 02/24/2016   Bilateral foot pain 01/22/2016   NASH (nonalcoholic steatohepatitis) 05/26/2015    Class: Stage 3   Nasal septal perforation 07/11/2014   Controlled diabetes mellitus type 2 with complications ([place]) 19/50/9326   Physical deconditioning 04/24/2014   Obesity, Class II, BMI 35-39.9, with comorbidity 06/09/2013   OSA (obstructive sleep apnea) 71/24/5809   Chronic systolic heart failure ([place]) 05/23/2013   Asthma, moderate persistent 04/18/2013   Chronic cough 04/18/2013   Breast cancer of upper-inner quadrant of left female breast ([place]) 03/18/2010   Hyperlipidemia 11/07/2009   Fibromyalgia 10/29/2009   Complete heart block ([place]) s/p MDT BiVP 10/23/2009   PACEMAKER, PERMANENT 10/23/2009   ALKALINE PHOSPHATASE, ELEVATED 10/13/2009   Hypothyroidism 10/03/2009   Benign essential hypertension 07/02/2009   Chronic back pain 07/02/2009    Immunization History  Administered Date(s) Administered   Fluad Quad(high Dose 65+) 04/11/2019, 05/02/2020   Influenza Split 05/10/2012, 05/04/2014   Influenza Whole 06/14/2009, 05/08/2010, 04/24/2011   Influenza, High Dose Seasonal PF 05/17/2017, 05/24/2018, 04/11/2019, 05/02/2020   Influenza,inj,Quad PF,6+ Mos 04/28/2013, 05/04/2014, 05/06/2015, 05/18/2016   Moderna Sars-Covid-2 Vaccination 11/01/2019, 11/29/2019, 07/19/2020    Pneumococcal Conjugate-13 05/25/2014   Pneumococcal Polysaccharide-23 11/28/2012, 12/28/2018   Td 08/24/2004, 03/07/2010   Tdap 07/14/2012  Zoster Recombinat (Shingrix) 10/06/2017, 01/16/2020   Zoster, Live 04/24/2011    Conditions to be addressed/monitored: HTN, HLD, and DMII  There are no care plans that you recently modified to display for this patient.   Medication Assistance:  Pending  Patient's preferred pharmacy is:  [id number] - [place], [place] - [address] AT [address] [place] [address] Phone: [phone] Fax: [phone]  [facility]  ([place], [facility] [place] [address] [address] Phone: [phone] Fax: [phone] - [place], [place] [address] [place] [address] Phone: [phone] Fax: [phone]  [facility] ([facility]) - [place], [place] [address] Phone: [phone] Fax: [phone]  Uses pill box? Yes Pt endorses 100% compliance  Follow Up:  Patient agrees to Care Plan and Follow-up.  Plan: Telephone follow up appointment with care management team member scheduled for:  1 month  Darius Bump

## 2021-06-03 LAB — TSH: TSH: 3.26 mIU/L (ref 0.40–4.50)

## 2021-06-03 NOTE — Progress Notes (Signed)
Hi Tanya Hogan, your thyroid looks better this time at 3.2.  We will continue to monitor and plan to recheck again in 6 months.  Also we would love to get your flu vaccine up-to-date or happy to give that in a nurse visit if you would like.

## 2021-06-05 DIAGNOSIS — C50912 Malignant neoplasm of unspecified site of left female breast: Secondary | ICD-10-CM | POA: Diagnosis not present

## 2021-06-06 ENCOUNTER — Other Ambulatory Visit: Payer: Self-pay | Admitting: Family Medicine

## 2021-06-06 ENCOUNTER — Other Ambulatory Visit: Payer: Self-pay

## 2021-06-06 ENCOUNTER — Ambulatory Visit: Payer: Medicare Other | Admitting: Pharmacist

## 2021-06-06 DIAGNOSIS — E118 Type 2 diabetes mellitus with unspecified complications: Secondary | ICD-10-CM | POA: Diagnosis not present

## 2021-06-06 DIAGNOSIS — E1169 Type 2 diabetes mellitus with other specified complication: Secondary | ICD-10-CM

## 2021-06-06 DIAGNOSIS — E785 Hyperlipidemia, unspecified: Secondary | ICD-10-CM

## 2021-06-06 DIAGNOSIS — I10 Essential (primary) hypertension: Secondary | ICD-10-CM

## 2021-06-06 NOTE — Patient Instructions (Signed)
Visit Information  PATIENT GOALS:  Goals Addressed             This Visit's Progress    Medication Management       Patient Goals/Self-Care Activities Over the next 30 days, patient will:  take medications as prescribed  Follow Up Plan: Telephone follow up appointment with care management team member scheduled for:  2 weeks         Patient verbalizes understanding of instructions provided today and agrees to view in [place].   Telephone follow up appointment with care management team member scheduled for: 2 weeks  Tanya Hogan

## 2021-06-06 NOTE — Progress Notes (Signed)
Chronic Care Management Pharmacy Note  06/06/2021 Name:  Tanya Hogan MRN:  [id number] DOB:  Mar 23, 1951  Summary: working with Tanya Hogan for HTN, HLD, and primarily DM. This visit is to document care coordination efforts to assist w/ Ozempic patient assistance which is held up d/t financial paperwork issues. Next steps are for Tanya Hogan to submit a letter (handwritten or typed) documenting her monthly income, or submit SS annual statement.   Recommendations/Changes made from today's visit: Continue current regimen, cost assistance for BOTH ozempic + insulin to ease financial burden. Notified Tanya Hogan to complete next paperwork request and drop it off at the office.  Plan: f/u with pharmacist in 2 weeks  Subjective: Tanya Hogan is an 70 y.o. year old female who is a primary patient of [name], Rene Kocher, MD.  The CCM team was consulted for assistance with disease management and care coordination needs.    Engaged with patient face to face for initial visit in response to provider referral for pharmacy case management and/or care coordination services.   Consent to Services:  The patient was given information about Chronic Care Management services, agreed to services, and gave verbal consent prior to initiation of services.  Please see initial visit note for detailed documentation.   Patient Care Team: Hali Marry, MD as PCP - General (Family Medicine) Marcy Panning, MD (Hematology and Oncology) Lelon Perla, MD as Attending Physician (Cardiology) Evans Lance, MD (Cardiology) Dr. Caprice Kluver (Otolaryngology) Amada Kingfisher, MD as Consulting Physician (Hematology and Oncology) Gerre Pebbles, MD as Referring Physician (Gastroenterology) Haynes Dage, OD as Referring Physician (Optometry) Darius Bump, [facility] as Pharmacist (Pharmacist)   Objective:  Lab Results  Component Value Date   CREATININE 0.94 04/16/2021   CREATININE 0.89 03/26/2020    CREATININE 0.82 10/20/2019    Lab Results  Component Value Date   HGBA1C 6.4 (A) 04/16/2021   Last diabetic Eye exam:  Lab Results  Component Value Date/Time   HMDIABEYEEXA No Retinopathy 05/06/2020 12:00 AM    Last diabetic Foot exam: No results found for: HMDIABFOOTEX      Component Value Date/Time   CHOL 168 04/16/2021 0000   TRIG 120 04/16/2021 0000   HDL 73 04/16/2021 0000   CHOLHDL 2.3 04/16/2021 0000   VLDL 26 02/18/2017 0939   LDLCALC 74 04/16/2021 0000    Hepatic Function Latest Ref Rng & Units 04/16/2021 03/26/2020 10/20/2019  Total Protein 6.1 - 8.1 g/dL 7.1 6.6 6.4  Albumin 3.5 - 5.0 g/dL - - -  AST 10 - 35 U/L _0 ALT 6 - 29 U/L _1 Alk Phosphatase 38 - 126 U/L - - -  Total Bilirubin 0.2 - 1.2 mg/dL 0.4 0.4 0.3  Bilirubin, Direct 0.0 - 0.3 mg/dL - - -    Lab Results  Component Value Date/Time   TSH 3.26 06/02/2021 12:00 AM   TSH 4.90 (H) 04/16/2021 12:00 AM    CBC Latest Ref Rng & Units 04/16/2021 03/14/2019 12/28/2018  WBC 3.8 - 10.8 Thousand/uL 5.4 6.5 7.8  Hemoglobin 11.7 - 15.5 g/dL 12.2 14.1 15.0  Hematocrit 35.0 - 45.0 % 38.0 42.7 45.2(H)  Platelets 140 - 400 Thousand/uL 299 287 357    Lab Results  Component Value Date/Time   VD25OH 65 05/04/2014 02:27 PM   VD25OH 61 12/29/2010 08:05 AM    Clinical ASCVD: Yes  The 10-year ASCVD risk score (Arnett DK, et al., 2019) is: 28%  Values used to calculate the score:     Age: 70 years     Sex: Female     Is Non-Hispanic African American: No     Diabetic: Yes     Tobacco smoker: No     Systolic Blood Pressure: 283 mmHg     Is BP treated: Yes     HDL Cholesterol: 73 mg/dL     Total Cholesterol: 168 mg/dL    Other: (CHADS2VASc if Afib, PHQ9 if depression, MMRC or CAT for COPD, ACT, DEXA)  Social History   Tobacco Use  Smoking Status Former   Packs/day: 0.00   Years: 0.00   Pack years: 0.00   Types: Cigarettes   Quit date: 08/24/1966   Years since quitting: 54.8  Smokeless Tobacco  Never   BP Readings from Last 3 Encounters:  04/16/21 (!) 132/57  03/08/21 (!) 154/78  02/25/21 (!) 122/56   Pulse Readings from Last 3 Encounters:  05/13/21 88  04/16/21 88  03/08/21 83   Wt Readings from Last 3 Encounters:  04/16/21 219 lb (99.3 kg)  03/08/21 215 lb (97.5 kg)  02/25/21 220 lb 1.9 oz (99.8 kg)    Assessment: Review of patient past medical history, allergies, medications, health status, including review of consultants reports, laboratory and other test data, was performed as part of comprehensive evaluation and provision of chronic care management services.   SDOH:  (Social Determinants of Health) assessments and interventions performed:    CCM Care Plan  Allergies  Allergen Reactions   Penicillins Anaphylaxis    Has patient had a PCN reaction causing immediate rash, facial/tongue/throat swelling, SOB or lightheadedness with hypotension: Yes Has patient had a PCN reaction causing severe rash involving mucus membranes or skin necrosis: No Has patient had a PCN reaction that required hospitalization: No Has patient had a PCN reaction occurring within the last 10 years: No If all of the above answers are "NO", then may proceed with Cephalosporin use.    Accupril [Quinapril Hcl] Other (See Comments)    Hyperkalemia   Atorvastatin Other (See Comments)    msucle aches   Celebrex [Celecoxib]     ulcers   Fluticasone Other (See Comments)    Nasal ulcer.     Levocetirizine     Doesn't recall   Levomenol Other (See Comments)    unknown unknown    Losartan Potassium Itching   Naproxen Swelling   Nasonex [Mometasone Furoate] Other (See Comments)    nosebleed   Chamomile Other (See Comments)    unknown   Vesicare [Solifenacin] Other (See Comments)    "stopped me from urinating"   Aspirin Other (See Comments)    Pt states "burns holes in stomach", ulcers    Baking Soda-Fluoride [Sodium Fluoride] Itching   Clindamycin/Lincomycin Rash   Codeine Nausea And  Vomiting   Furosemide Other (See Comments)    Nose bleed   Lactose    Lactose Intolerance (Gi) Other (See Comments)    Increased calcium, sneezing, nasal issues   Lanolin-Petrolatum Rash   Metformin And Related Diarrhea   Miralax [Polyethylene Glycol 3350]    Miralax [Polyethylene Glycol] Rash   Nasonex [Mometasone Furoate] Other (See Comments)   Quinine Rash   Rifampin Other (See Comments)    DOES NOT REMEMBER THIS MEDICATION OR ALLERGY   Sulfa Antibiotics Rash   Sulfonamide Derivatives Rash   Tramadol Rash    Anxiety   Wellbutrin [Bupropion] Other (See Comments)    Muscle aches, pain, "made my  skin crawl"   Wool Alcohol [Lanolin] Rash   Zoledronic Acid Other (See Comments) and Rash    shaking    Medications Reviewed Today     Reviewed by Hali Marry, MD (Physician) on 05/13/21 at [phone]  Med List Status: <None>   Medication Order Taking? Sig Documenting Provider Last Dose Status Informant  Accu-Chek FastClix Lancets MISC [id number] No Apply topically. [provider] Taking Active   alendronate (FOSAMAX) 70 MG tablet [id number] No Take 1 tablet (70 mg total) by mouth every 7 (seven) days. Take with a full glass of water on an empty stomach. Hali Marry, MD Taking Active   Camillo Flaming MEDICATION [id number] No Medication Name: Tubing and supplies for nebulizer. Fax - [phone] Hali Marry, MD Taking Active   amiodarone (PACERONE) 200 MG tablet [id number] No TAKE ONE-HALF TABLET BY  MOUTH DAILY Evans Lance, MD Taking Active   blood glucose meter kit and supplies KIT [id number] No Dispense based on patient and insurance preference. Hali Marry, MD Taking Active   bumetanide [facility]) 1 MG tablet [id number] No TAKE 1 TABLET BY MOUTH  TWICE DAILY Hali Marry, MD Taking Active   clotrimazole-betamethasone (LOTRISONE) cream [id number] No  [provider] Taking Active   dicyclomine (BENTYL) 20 MG tablet  [id number] No Take by mouth. [provider] Taking Active   diltiazem (CARDIZEM CD) 180 MG 24 hr capsule [id number] No TAKE 1 CAPSULE BY MOUTH  DAILY Hali Marry, MD Taking Active   EPIPEN 2-PAK 0.3 MG/0.3ML SOAJ injection [id number] No INJECT 1 PEN INTO MUSCLE OF LATERAL THIGH FOR SEVERE ALLERGIC REACTION. CALL 911 AFTER USING THIS MEDICATION. Hali Marry, MD Taking Active   fexofenadine-pseudoephedrine (ALLEGRA-D 24) 180-240 MG 24 hr tablet [id number] No Take 1 tablet by mouth daily. [provider] Taking Active Self  fluticasone-salmeterol (ADVAIR HFA) 165-79 MCG/ACT inhaler [id number] No Inhale 2 puffs into the lungs 2 (two) times daily. [provider] Taking Expired 02/25/21 2359   glucose blood (ACCU-CHEK GUIDE) test strip [id number] No Dx DM E11.21 - Check fasting blood sugar every morning before breakfast and before lunch and dinner. 3 times daily Hali Marry, MD Taking Active   insulin glargine (LANTUS SOLOSTAR) 100 UNIT/ML Solostar Pen [id number] No Inject 40 Units into the skin at bedtime.  Patient taking differently: Inject 40 Units into the skin at bedtime. Takes 20 units at bedtime.   Hali Marry, MD Taking Active   insulin lispro (HUMALOG KWIKPEN) 100 UNIT/ML KwikPen [id number] No Inject 3-12 Units into the skin 3 (three) times daily as needed (hyperglycemia>150). Sliding scale. Administer subQ within 15 minutes before or immediately after a meal Hali Marry, MD Taking Active   Insulin Pen Needle 31G X 8 MM MISC [id number] No To be used for injecting insulin Dx: E11.8 Hali Marry, MD Taking Active   Lancets Misc. (ACCU-CHEK FASTCLIX LANCET) KIT [id number] No E11.21 Dx DM Check fasting blood sugar every morning before breakfast and before lunch and dinner. 3 times daily Hali Marry, MD Taking Active   levalbuterol Penne Lash) 1.25 MG/3ML nebulizer solution [id number] Yes Take 1.25 mg by nebulization every  6 (six) hours as needed for wheezing. Hali Marry, MD  Active   levothyroxine (SYNTHROID) 75 MCG tablet [id number] No TAKE 1 TABLET BY MOUTH  DAILY Hali Marry, MD Taking Active   LINZESS 72 MCG capsule [id number] No Take 72 mcg by mouth 2 (two) times daily. [provider] Taking Active   Multiple Vitamins-Minerals (OCUVITE EYE + MULTI) TABS [id number] No Take 1 tablet by mouth daily. [provider] Taking Active Self  Olopatadine HCl 0.2 % SOLN [id number] No Place 1 drop into both eyes daily. [provider] Taking Active   pantoprazole (PROTONIX) 40 MG tablet [id number] No Take 40 mg by mouth daily. [provider] Taking Active   pravastatin (PRAVACHOL) 40 MG tablet [id number] No Take 1 tablet (40 mg total) by mouth at bedtime. Hali Marry, MD Taking Active   PROAIR HFA 108 [id number] Base) MCG/ACT inhaler [id number] No USE 1 TO 2 INHALATIONS BY  MOUTH EVERY 6 HOURS AS  NEEDED FOR WHEEZING OR  SHORTNESS OF BREATH Hali Marry, MD Taking Active   RESTASIS 0.05 % ophthalmic emulsion [id number] No  [provider] Taking Active   Tiotropium Bromide Monohydrate 2.5 MCG/ACT AERS [id number] No Inhale 2 puffs into the lungs daily. [provider] Taking Expired 02/25/21 2359   valACYclovir (VALTREX) 1000 MG tablet [id number] No TAKE 1 TABLET BY MOUTH TWICE DAILY Hali Marry, MD Taking Active             Patient Active Problem List   Diagnosis Date Noted   Osteoporosis 02/28/2021   Primary osteoarthritis of left knee 01/14/2021   Irritable bowel syndrome with constipation 09/16/2020   Aortic atherosclerosis ([place]) 06/11/2020   Benign lipomatous neoplasm of skin and subcutaneous tissue of left leg 06/11/2020   Paroxysmal VT 02/14/2020   Chronic constipation 10/06/2019   Moderate persistent asthma with exacerbation 08/29/2019   Genetic testing 08/14/2019   Ductal carcinoma in situ (DCIS) of right breast  06/15/2019   Arthralgia 04/11/2019   Malignant tumor of breast ([place]) 03/06/2019   Fatigue 03/06/2019   Status post total shoulder arthroplasty, right 03/10/2018   DDD (degenerative disc disease), cervical 11/19/2017   Bradycardia with less than 60 beats per minute 11/02/2017   Peripheral edema 05/19/2017   Microcytic anemia 05/14/2017   Delayed sleep phase syndrome 10/29/2016   Primary osteoarthritis of left ankle 09/24/2016   Gastrointestinal food sensitivity 05/21/2016   History of arthroplasty of right shoulder 02/24/2016   Bilateral foot pain 01/22/2016   NASH (nonalcoholic steatohepatitis) 05/26/2015    Class: Stage 3   Nasal septal perforation 07/11/2014   Controlled diabetes mellitus type 2 with complications ([place]) 38/46/6599   Physical deconditioning 04/24/2014   Obesity, Class II, BMI 35-39.9, with comorbidity 06/09/2013   OSA (obstructive sleep apnea) 35/70/1779   Chronic systolic heart failure ([place]) 05/23/2013   Asthma, moderate persistent 04/18/2013   Chronic cough 04/18/2013   Breast cancer of upper-inner quadrant of left female breast ([place]) 03/18/2010   Hyperlipidemia 11/07/2009   Fibromyalgia 10/29/2009   Complete heart block ([place]) s/p MDT BiVP 10/23/2009   PACEMAKER, PERMANENT 10/23/2009   ALKALINE PHOSPHATASE, ELEVATED 10/13/2009   Hypothyroidism 10/03/2009   Benign essential hypertension 07/02/2009   Chronic back pain 07/02/2009    Immunization History  Administered Date(s) Administered   Fluad Quad(high Dose 65+) 04/11/2019, 05/02/2020   Influenza Split 05/10/2012, 05/04/2014   Influenza Whole 06/14/2009, 05/08/2010, 04/24/2011   Influenza, High Dose Seasonal PF 05/17/2017, 05/24/2018, 04/11/2019, 05/02/2020   Influenza,inj,Quad PF,6+ Mos 04/28/2013, 05/04/2014, 05/06/2015, 05/18/2016   Moderna Sars-Covid-2 Vaccination 11/01/2019, 11/29/2019, 07/19/2020   Pneumococcal Conjugate-13 05/25/2014   Pneumococcal Polysaccharide-23 11/28/2012, 12/28/2018   Td  08/24/2004, 03/07/2010   Tdap 07/14/2012   Zoster Recombinat (Shingrix) 10/06/2017, 01/16/2020   Zoster, Live 04/24/2011    Conditions to  be addressed/monitored: HTN, HLD, and DMII  There are no care plans that you recently modified to display for this patient.   Medication Assistance:  Pending  Patient's preferred pharmacy is:  [id number] - [place], [place] - [address] AT [place] [address] [place] [address] Phone: [phone] Fax: [phone]  [facility]  ([place], [place] [facility] [place] [address] [address] Phone: [phone] Fax: [phone] - [place], [place] [address] [place] [address] Phone: [phone] Fax: [phone]  [facility] ([facility]) - [place], [place] [address] Phone: [phone] Fax: [phone]  Uses pill box? Yes Pt endorses 100% compliance  Follow Up:  Patient agrees to Care Plan and Follow-up.  Plan: Telephone follow up appointment with care management team member scheduled for:  1 month  Darius Bump

## 2021-06-09 ENCOUNTER — Telehealth: Payer: Self-pay | Admitting: Family Medicine

## 2021-06-09 NOTE — Telephone Encounter (Signed)
Received this message and will collect the letter - thank you!

## 2021-06-09 NOTE — Telephone Encounter (Signed)
Pt left a letter for [facility] and I placed it on [employer].

## 2021-06-10 DIAGNOSIS — D649 Anemia, unspecified: Secondary | ICD-10-CM | POA: Diagnosis not present

## 2021-06-11 LAB — HM DIABETES EYE EXAM

## 2021-06-12 DIAGNOSIS — E1142 Type 2 diabetes mellitus with diabetic polyneuropathy: Secondary | ICD-10-CM | POA: Diagnosis not present

## 2021-06-16 ENCOUNTER — Other Ambulatory Visit: Payer: Self-pay

## 2021-06-16 ENCOUNTER — Ambulatory Visit: Payer: Medicare Other | Admitting: Pharmacist

## 2021-06-16 DIAGNOSIS — T50905A Adverse effect of unspecified drugs, medicaments and biological substances, initial encounter: Secondary | ICD-10-CM

## 2021-06-16 DIAGNOSIS — E1169 Type 2 diabetes mellitus with other specified complication: Secondary | ICD-10-CM

## 2021-06-16 DIAGNOSIS — R739 Hyperglycemia, unspecified: Secondary | ICD-10-CM

## 2021-06-16 DIAGNOSIS — E118 Type 2 diabetes mellitus with unspecified complications: Secondary | ICD-10-CM

## 2021-06-16 DIAGNOSIS — I10 Essential (primary) hypertension: Secondary | ICD-10-CM

## 2021-06-16 MED ORDER — INSULIN PEN NEEDLE 31G X 8 MM MISC: 3 refills | Status: DC

## 2021-06-16 NOTE — Patient Instructions (Signed)
Visit Information  PATIENT GOALS:  Goals Addressed             This Visit's Progress    Medication Management       Patient Goals/Self-Care Activities Over the next 30 days, patient will:  take medications as prescribed  Follow Up Plan: Telephone follow up appointment with care management team member scheduled for:  3 weeks         Patient verbalizes understanding of instructions provided today and agrees to view in [place].   Telephone follow up appointment with care management team member scheduled for: 3 weeks  Darius Bump

## 2021-06-16 NOTE — Progress Notes (Signed)
Chronic Care Management Pharmacy Note  06/16/2021 Name:  Tanya Hogan MRN:  [id number] DOB:  09/01/1950  Summary: addressed HTN, HLD, and primarily DM. Pt recently began CGM & states she is noticing nocturnal hyperglycemia & seems unexplained/unrelated to diet; denies eating late at night. BG 130s in AM but 200s at night. Also receiving iron infusions for anemia (+ 48m methylprednisolone) on 06/10/21 and 06/16/21, which steroids may be the culprit for some BG irregularity.  Recommendations/Changes made from today's visit: Continue current regimen, cost assistance for BOTH ozempic + insulin is pending.   Plan: f/u with pharmacist in 3 weeks  Subjective: Tanya MISHKINis an 70y.o. year old female who is a primary patient of [name], CRene Kocher MD.  The CCM team was consulted for assistance with disease management and care coordination needs.    Engaged with patient face to face for initial visit in response to provider referral for pharmacy case management and/or care coordination services.   Consent to Services:  The patient was given information about Chronic Care Management services, agreed to services, and gave verbal consent prior to initiation of services.  Please see initial visit note for detailed documentation.   Patient Care Team: MHali Marry MD as PCP - General (Family Medicine) KMarcy Panning MD (Hematology and Oncology) CLelon Perla MD as Attending Physician (Cardiology) TEvans Lance MD (Cardiology) Dr. LCaprice Kluver(Otolaryngology) FAmada Kingfisher MD as Consulting Physician (Hematology and Oncology) AGerre Pebbles MD as Referring Physician (Gastroenterology) FHaynes Dage OD as Referring Physician (Optometry) KDarius Bump [facility] Pharmacist (Pharmacist)   Objective:  Lab Results  Component Value Date   CREATININE 0.94 04/16/2021   CREATININE 0.89 03/26/2020   CREATININE 0.82 10/20/2019    Lab Results  Component Value Date    HGBA1C 6.4 (A) 04/16/2021   Last diabetic Eye exam:  Lab Results  Component Value Date/Time   HMDIABEYEEXA No Retinopathy 06/11/2021 12:00 AM    Last diabetic Foot exam: No results found for: HMDIABFOOTEX      Component Value Date/Time   CHOL 168 04/16/2021 0000   TRIG 120 04/16/2021 0000   HDL 73 04/16/2021 0000   CHOLHDL 2.3 04/16/2021 0000   VLDL 26 02/18/2017 0939   LDLCALC 74 04/16/2021 0000    Hepatic Function Latest Ref Rng & Units 04/16/2021 03/26/2020 10/20/2019  Total Protein 6.1 - 8.1 g/dL 7.1 6.6 6.4  Albumin 3.5 - 5.0 g/dL - - -  AST 10 - 35 U/L _0 ALT 6 - 29 U/L _1 Alk Phosphatase 38 - 126 U/L - - -  Total Bilirubin 0.2 - 1.2 mg/dL 0.4 0.4 0.3  Bilirubin, Direct 0.0 - 0.3 mg/dL - - -    Lab Results  Component Value Date/Time   TSH 3.26 06/02/2021 12:00 AM   TSH 4.90 (H) 04/16/2021 12:00 AM    CBC Latest Ref Rng & Units 04/16/2021 03/14/2019 12/28/2018  WBC 3.8 - 10.8 Thousand/uL 5.4 6.5 7.8  Hemoglobin 11.7 - 15.5 g/dL 12.2 14.1 15.0  Hematocrit 35.0 - 45.0 % 38.0 42.7 45.2(H)  Platelets 140 - 400 Thousand/uL 299 287 357    Lab Results  Component Value Date/Time   VD25OH 65 05/04/2014 02:27 PM   VD25OH 61 12/29/2010 08:05 AM    Clinical ASCVD: Yes  The 10-year ASCVD risk score (Arnett DK, et al., 2019) is: 29.1%   Values used to calculate the score:     Age:  70 years     Sex: Female     Is Non-Hispanic African American: No     Diabetic: Yes     Tobacco smoker: No     Systolic Blood Pressure: 161 mmHg     Is BP treated: Yes     HDL Cholesterol: 73 mg/dL     Total Cholesterol: 168 mg/dL      Social History   Tobacco Use  Smoking Status Former   Packs/day: 0.00   Years: 0.00   Pack years: 0.00   Types: Cigarettes   Quit date: 08/24/1966   Years since quitting: 54.8  Smokeless Tobacco Never   BP Readings from Last 3 Encounters:  04/16/21 (!) 132/57  03/08/21 (!) 154/78  02/25/21 (!) 122/56   Pulse Readings from Last 3  Encounters:  05/13/21 88  04/16/21 88  03/08/21 83   Wt Readings from Last 3 Encounters:  04/16/21 219 lb (99.3 kg)  03/08/21 215 lb (97.5 kg)  02/25/21 220 lb 1.9 oz (99.8 kg)    Assessment: Review of patient past medical history, allergies, medications, health status, including review of consultants reports, laboratory and other test data, was performed as part of comprehensive evaluation and provision of chronic care management services.   SDOH:  (Social Determinants of Health) assessments and interventions performed:    CCM Care Plan  Allergies  Allergen Reactions   Penicillins Anaphylaxis    Has patient had a PCN reaction causing immediate rash, facial/tongue/throat swelling, SOB or lightheadedness with hypotension: Yes Has patient had a PCN reaction causing severe rash involving mucus membranes or skin necrosis: No Has patient had a PCN reaction that required hospitalization: No Has patient had a PCN reaction occurring within the last 10 years: No If all of the above answers are "NO", then may proceed with Cephalosporin use.    Accupril [Quinapril Hcl] Other (See Comments)    Hyperkalemia   Atorvastatin Other (See Comments)    msucle aches   Celebrex [Celecoxib]     ulcers   Fluticasone Other (See Comments)    Nasal ulcer.     Levocetirizine     Doesn't recall   Levomenol Other (See Comments)    unknown unknown    Losartan Potassium Itching   Naproxen Swelling   Nasonex [Mometasone Furoate] Other (See Comments)    nosebleed   Chamomile Other (See Comments)    unknown   Vesicare [Solifenacin] Other (See Comments)    "stopped me from urinating"   Aspirin Other (See Comments)    Pt states "burns holes in stomach", ulcers    Baking Soda-Fluoride [Sodium Fluoride] Itching   Clindamycin/Lincomycin Rash   Codeine Nausea And Vomiting   Furosemide Other (See Comments)    Nose bleed   Lactose    Lactose Intolerance (Gi) Other (See Comments)    Increased calcium,  sneezing, nasal issues   Lanolin-Petrolatum Rash   Metformin And Related Diarrhea   Miralax [Polyethylene Glycol 3350]    Miralax [Polyethylene Glycol] Rash   Nasonex [Mometasone Furoate] Other (See Comments)   Quinine Rash   Rifampin Other (See Comments)    DOES NOT REMEMBER THIS MEDICATION OR ALLERGY   Sulfa Antibiotics Rash   Sulfonamide Derivatives Rash   Tramadol Rash    Anxiety   Wellbutrin [Bupropion] Other (See Comments)    Muscle aches, pain, "made my skin crawl"   Wool Alcohol [Lanolin] Rash   Zoledronic Acid Other (See Comments) and Rash    shaking  Medications Reviewed Today     Reviewed by Hali Marry, MD (Physician) on 05/13/21 at [phone]  Med List Status: <None>   Medication Order Taking? Sig Documenting Provider Last Dose Status Informant  Accu-Chek FastClix Lancets MISC [id number] No Apply topically. [provider] Taking Active   alendronate (FOSAMAX) 70 MG tablet [id number] No Take 1 tablet (70 mg total) by mouth every 7 (seven) days. Take with a full glass of water on an empty stomach. Hali Marry, MD Taking Active   Camillo Flaming MEDICATION [id number] No Medication Name: Tubing and supplies for nebulizer. Fax - [phone] Hali Marry, MD Taking Active   amiodarone (PACERONE) 200 MG tablet [id number] No TAKE ONE-HALF TABLET BY  MOUTH DAILY Evans Lance, MD Taking Active   blood glucose meter kit and supplies KIT [id number] No Dispense based on patient and insurance preference. Hali Marry, MD Taking Active   bumetanide [facility]) 1 MG tablet [id number] No TAKE 1 TABLET BY MOUTH  TWICE DAILY Hali Marry, MD Taking Active   clotrimazole-betamethasone (LOTRISONE) cream [id number] No  [provider] Taking Active   dicyclomine (BENTYL) 20 MG tablet [id number] No Take by mouth. [provider] Taking Active   diltiazem (CARDIZEM CD) 180 MG 24 hr capsule [id number] No TAKE 1 CAPSULE BY  MOUTH  DAILY Hali Marry, MD Taking Active   EPIPEN 2-PAK 0.3 MG/0.3ML SOAJ injection [id number] No INJECT 1 PEN INTO MUSCLE OF LATERAL THIGH FOR SEVERE ALLERGIC REACTION. CALL 911 AFTER USING THIS MEDICATION. Hali Marry, MD Taking Active   fexofenadine-pseudoephedrine (ALLEGRA-D 24) 180-240 MG 24 hr tablet [id number] No Take 1 tablet by mouth daily. [provider] Taking Active Self  fluticasone-salmeterol (ADVAIR HFA) 800-34 MCG/ACT inhaler [id number] No Inhale 2 puffs into the lungs 2 (two) times daily. [provider] Taking Expired 02/25/21 2359   glucose blood (ACCU-CHEK GUIDE) test strip [id number] No Dx DM E11.21 - Check fasting blood sugar every morning before breakfast and before lunch and dinner. 3 times daily Hali Marry, MD Taking Active   insulin glargine (LANTUS SOLOSTAR) 100 UNIT/ML Solostar Pen [id number] No Inject 40 Units into the skin at bedtime.  Patient taking differently: Inject 40 Units into the skin at bedtime. Takes 20 units at bedtime.   Hali Marry, MD Taking Active   insulin lispro (HUMALOG KWIKPEN) 100 UNIT/ML KwikPen [id number] No Inject 3-12 Units into the skin 3 (three) times daily as needed (hyperglycemia>150). Sliding scale. Administer subQ within 15 minutes before or immediately after a meal Hali Marry, MD Taking Active   Insulin Pen Needle 31G X 8 MM MISC [id number] No To be used for injecting insulin Dx: E11.8 Hali Marry, MD Taking Active   Lancets Misc. (ACCU-CHEK FASTCLIX LANCET) KIT [id number] No E11.21 Dx DM Check fasting blood sugar every morning before breakfast and before lunch and dinner. 3 times daily Hali Marry, MD Taking Active   levalbuterol Penne Lash) 1.25 MG/3ML nebulizer solution [id number] Yes Take 1.25 mg by nebulization every 6 (six) hours as needed for wheezing. Hali Marry, MD  Active   levothyroxine (SYNTHROID) 75 MCG tablet [id number] No TAKE 1 TABLET BY  MOUTH  DAILY Hali Marry, MD Taking Active   LINZESS 72 MCG capsule [id number] No Take 72 mcg by mouth 2 (two) times daily. [provider] Taking Active   Multiple Vitamins-Minerals (OCUVITE EYE + MULTI) TABS [id number] No Take 1 tablet by mouth daily. [provider] Taking  Active Self  Olopatadine HCl 0.2 % SOLN [id number] No Place 1 drop into both eyes daily. [provider] Taking Active   pantoprazole (PROTONIX) 40 MG tablet [id number] No Take 40 mg by mouth daily. [provider] Taking Active   pravastatin (PRAVACHOL) 40 MG tablet [id number] No Take 1 tablet (40 mg total) by mouth at bedtime. Hali Marry, MD Taking Active   PROAIR HFA 108 330 450 9607 Base) MCG/ACT inhaler [id number] No USE 1 TO 2 INHALATIONS BY  MOUTH EVERY 6 HOURS AS  NEEDED FOR WHEEZING OR  SHORTNESS OF BREATH Hali Marry, MD Taking Active   RESTASIS 0.05 % ophthalmic emulsion [id number] No  [provider] Taking Active   Tiotropium Bromide Monohydrate 2.5 MCG/ACT AERS [id number] No Inhale 2 puffs into the lungs daily. [provider] Taking Expired 02/25/21 2359   valACYclovir (VALTREX) 1000 MG tablet [id number] No TAKE 1 TABLET BY MOUTH TWICE DAILY Hali Marry, MD Taking Active             Patient Active Problem List   Diagnosis Date Noted   Osteoporosis 02/28/2021   Primary osteoarthritis of left knee 01/14/2021   Irritable bowel syndrome with constipation 09/16/2020   Aortic atherosclerosis ([place]) 06/11/2020   Benign lipomatous neoplasm of skin and subcutaneous tissue of left leg 06/11/2020   Paroxysmal VT 02/14/2020   Chronic constipation 10/06/2019   Moderate persistent asthma with exacerbation 08/29/2019   Genetic testing 08/14/2019   Ductal carcinoma in situ (DCIS) of right breast 06/15/2019   Arthralgia 04/11/2019   Malignant tumor of breast ([place]) 03/06/2019   Fatigue 03/06/2019   Status post total shoulder arthroplasty,  right 03/10/2018   DDD (degenerative disc disease), cervical 11/19/2017   Bradycardia with less than 60 beats per minute 11/02/2017   Peripheral edema 05/19/2017   Microcytic anemia 05/14/2017   Delayed sleep phase syndrome 10/29/2016   Primary osteoarthritis of left ankle 09/24/2016   Gastrointestinal food sensitivity 05/21/2016   History of arthroplasty of right shoulder 02/24/2016   Bilateral foot pain 01/22/2016   NASH (nonalcoholic steatohepatitis) 05/26/2015    Class: Stage 3   Nasal septal perforation 07/11/2014   Controlled diabetes mellitus type 2 with complications ([place]) 37/85/8850   Physical deconditioning 04/24/2014   Obesity, Class II, BMI 35-39.9, with comorbidity 06/09/2013   OSA (obstructive sleep apnea) 27/74/1287   Chronic systolic heart failure ([place]) 05/23/2013   Asthma, moderate persistent 04/18/2013   Chronic cough 04/18/2013   Breast cancer of upper-inner quadrant of left female breast ([place]) 03/18/2010   Hyperlipidemia 11/07/2009   Fibromyalgia 10/29/2009   Complete heart block ([place]) s/p MDT BiVP 10/23/2009   PACEMAKER, PERMANENT 10/23/2009   ALKALINE PHOSPHATASE, ELEVATED 10/13/2009   Hypothyroidism 10/03/2009   Benign essential hypertension 07/02/2009   Chronic back pain 07/02/2009    Immunization History  Administered Date(s) Administered   Fluad Quad(high Dose 65+) 04/11/2019, 05/02/2020   Influenza Split 05/10/2012, 05/04/2014   Influenza Whole 06/14/2009, 05/08/2010, 04/24/2011   Influenza, High Dose Seasonal PF 05/17/2017, 05/24/2018, 04/11/2019, 05/02/2020   Influenza,inj,Quad PF,6+ Mos 04/28/2013, 05/04/2014, 05/06/2015, 05/18/2016   Moderna Sars-Covid-2 Vaccination 11/01/2019, 11/29/2019, 07/19/2020   Pneumococcal Conjugate-13 05/25/2014   Pneumococcal Polysaccharide-23 11/28/2012, 12/28/2018   Td 08/24/2004, 03/07/2010   Tdap 07/14/2012   Zoster Recombinat (Shingrix) 10/06/2017, 01/16/2020   Zoster, Live 04/24/2011    Conditions to be  addressed/monitored: HTN, HLD, and DMII  Care Plan : Medication Management  Updates made by Darius Bump, [place] since 06/16/2021 12:00 AM  Problem: DM, HTN, HLD      Long-Range Goal: Disease Progression Prevention   Start Date: 05/21/2021  Recent Progress: On track  Priority: High  Note:   Current Barriers:  Unable to independently afford treatment regimen Pt describes falling into donut hole after first 3 months of the year, then June or July into catastrophic coverage. Stressful process for her.  Pharmacist Clinical Goal(s):  Over the next 30 days, patient will verbalize ability to afford treatment regimen adhere to plan to optimize therapeutic regimen for diabetes as evidenced by report of adherence to recommended medication management changes through collaboration with PharmD and provider.   Interventions: 1:1 collaboration with Hali Marry, MD regarding development and update of comprehensive plan of care as evidenced by provider attestation and co-signature Inter-disciplinary care team collaboration (see longitudinal plan of care) Comprehensive medication review performed; medication list updated in electronic medical record  Diabetes:  Controlled; current treatment:lantus 28 units at bedtime (pt self-increased due to higher sugars than usual), lispro 3-12 units TID (sliding scale), ;   Current glucose readings: "stays slightly elevated, 130-140s" per patient Per CGM & verbal discussion with patient: 206, 220 at 1am. She is still working on technique for CGM.  Denies hypoglycemic/hyperglycemic symptoms  Current meal patterns: to be discussed at future visits  Current exercise: to be discussed at future visits  Counseled on goal BG values, answered pt questions about CGM vs traditional glucometer Recommended continue insulin titration by 2 units every few days to goal fasting BG <130. Recommend patient initiate ozempic 0.47m weekly, novonordisk application for  cost assistance in progress with BOTH GLP1 + insulin to ease financial burden.   Hypertension:   Controlled; current treatment:diltiazem 187mdaily, ;   Current home readings: to be discussed at future visits  Denies hypotensive/hypertensive symptoms  Recommended continue current regimen, and  Hyperlipidemia:  Controlled; current treatment:pravastatin 4040maily;   Recommended continue current regimen  Patient Goals/Self-Care Activities Over the next 30 days, patient will:  take medications as prescribed  Follow Up Plan: Telephone follow up appointment with care management team member scheduled for:  3 weeks      Medication Assistance:  Pending  Patient's preferred pharmacy is:  [id number] [address] [place]one: [phone]: [phone]  ([address] [facility] [place] [place]87-[address]one: [phone]: [phone] OppenheimARMACY [id number] [id number]: [phone]: [phone] Rehabilitation Hospitall[facility] ([facility]) - [address] [place] [id number]: [phone]: [phone] pill box? Yes Pt endorses 100% compliance  Follow Up:  Patient agrees to Care Plan and Follow-up.  Plan: Telephone follow up appointment with care management team member scheduled for:  3 weeks  KeeDarius Bump

## 2021-06-17 DIAGNOSIS — D649 Anemia, unspecified: Secondary | ICD-10-CM | POA: Diagnosis not present

## 2021-06-23 DIAGNOSIS — I10 Essential (primary) hypertension: Secondary | ICD-10-CM

## 2021-06-23 DIAGNOSIS — Z794 Long term (current) use of insulin: Secondary | ICD-10-CM | POA: Diagnosis not present

## 2021-06-23 DIAGNOSIS — E1121 Type 2 diabetes mellitus with diabetic nephropathy: Secondary | ICD-10-CM | POA: Diagnosis not present

## 2021-06-23 DIAGNOSIS — E785 Hyperlipidemia, unspecified: Secondary | ICD-10-CM

## 2021-06-23 DIAGNOSIS — E118 Type 2 diabetes mellitus with unspecified complications: Secondary | ICD-10-CM

## 2021-06-23 DIAGNOSIS — E1169 Type 2 diabetes mellitus with other specified complication: Secondary | ICD-10-CM

## 2021-06-30 ENCOUNTER — Ambulatory Visit (INDEPENDENT_AMBULATORY_CARE_PROVIDER_SITE_OTHER): Payer: Medicare Other

## 2021-06-30 DIAGNOSIS — I442 Atrioventricular block, complete: Secondary | ICD-10-CM

## 2021-07-01 LAB — CUP PACEART REMOTE DEVICE CHECK
Battery Remaining Longevity: 11 mo
Battery Voltage: 2.87 V
Brady Statistic AP VP Percent: 4.51 %
Brady Statistic AP VS Percent: 0.01 %
Brady Statistic AS VP Percent: 94.12 %
Brady Statistic AS VS Percent: 1.35 %
Brady Statistic RA Percent Paced: 4.49 %
Brady Statistic RV Percent Paced: 98 %
Date Time Interrogation Session: 20221107072427
Implantable Lead Implant Date: 20030324
Implantable Lead Implant Date: 20030324
Implantable Lead Implant Date: 20030324
Implantable Lead Location: 753858
Implantable Lead Location: 753859
Implantable Lead Location: 753860
Implantable Lead Model: 4068
Implantable Lead Model: 4068
Implantable Lead Model: 4193
Implantable Pulse Generator Implant Date: 20151231
Lead Channel Impedance Value: 399 Ohm
Lead Channel Impedance Value: 4047 Ohm
Lead Channel Impedance Value: 4047 Ohm
Lead Channel Impedance Value: 4047 Ohm
Lead Channel Impedance Value: 418 Ohm
Lead Channel Impedance Value: 418 Ohm
Lead Channel Impedance Value: 532 Ohm
Lead Channel Impedance Value: 779 Ohm
Lead Channel Impedance Value: 950 Ohm
Lead Channel Pacing Threshold Amplitude: 1.125 V
Lead Channel Pacing Threshold Amplitude: 1.875 V
Lead Channel Pacing Threshold Pulse Width: 0.4 ms
Lead Channel Pacing Threshold Pulse Width: 0.4 ms
Lead Channel Sensing Intrinsic Amplitude: 1.5 mV
Lead Channel Sensing Intrinsic Amplitude: 1.5 mV
Lead Channel Sensing Intrinsic Amplitude: 13.25 mV
Lead Channel Sensing Intrinsic Amplitude: 13.25 mV
Lead Channel Setting Pacing Amplitude: 1.75 V
Lead Channel Setting Pacing Amplitude: 2.5 V
Lead Channel Setting Pacing Amplitude: 3.5 V
Lead Channel Setting Pacing Pulse Width: 0.8 ms
Lead Channel Setting Pacing Pulse Width: 0.8 ms
Lead Channel Setting Sensing Sensitivity: 4 mV

## 2021-07-03 ENCOUNTER — Other Ambulatory Visit: Payer: Self-pay

## 2021-07-03 DIAGNOSIS — E038 Other specified hypothyroidism: Secondary | ICD-10-CM | POA: Diagnosis not present

## 2021-07-03 NOTE — Progress Notes (Signed)
Ordered labs

## 2021-07-04 ENCOUNTER — Other Ambulatory Visit: Payer: Self-pay | Admitting: *Deleted

## 2021-07-04 ENCOUNTER — Other Ambulatory Visit: Payer: Self-pay | Admitting: Family Medicine

## 2021-07-04 DIAGNOSIS — E038 Other specified hypothyroidism: Secondary | ICD-10-CM

## 2021-07-04 LAB — TSH: TSH: 5.96 mIU/L — ABNORMAL HIGH (ref 0.40–4.50)

## 2021-07-04 MED ORDER — LEVOTHYROXINE SODIUM 88 MCG PO TABS: 88.0000 ug | ORAL_TABLET | Freq: Every day | ORAL | 0 refills | Status: DC

## 2021-07-04 NOTE — Progress Notes (Signed)
Remote pacemaker transmission.   

## 2021-07-04 NOTE — Progress Notes (Signed)
Meds ordered this encounter  Medications   levothyroxine (SYNTHROID) 88 MCG tablet    Sig: Take 1 tablet (88 mcg total) by mouth daily.    Dispense:  90 tablet    Refill:  0    Requesting 1 year supply

## 2021-07-04 NOTE — Progress Notes (Signed)
OK, get a send over a new prescription for the 88 mcg dose to take daily instead of the 75's.  New prescription sent to pharmacy.  Plan to recheck level again in about 8 weeks.

## 2021-07-04 NOTE — Progress Notes (Signed)
Hi Tanya Hogan, your thyroid is off again.  It actually looks better last month.  As you changed how you are taking the medication recently?  Please verify how you are dosing it.

## 2021-07-07 ENCOUNTER — Ambulatory Visit (INDEPENDENT_AMBULATORY_CARE_PROVIDER_SITE_OTHER): Payer: Medicare Other | Admitting: Pharmacist

## 2021-07-07 ENCOUNTER — Other Ambulatory Visit: Payer: Self-pay

## 2021-07-07 DIAGNOSIS — I10 Essential (primary) hypertension: Secondary | ICD-10-CM

## 2021-07-07 DIAGNOSIS — E1169 Type 2 diabetes mellitus with other specified complication: Secondary | ICD-10-CM

## 2021-07-07 DIAGNOSIS — E118 Type 2 diabetes mellitus with unspecified complications: Secondary | ICD-10-CM | POA: Diagnosis not present

## 2021-07-07 DIAGNOSIS — E785 Hyperlipidemia, unspecified: Secondary | ICD-10-CM

## 2021-07-07 DIAGNOSIS — Z794 Long term (current) use of insulin: Secondary | ICD-10-CM

## 2021-07-07 DIAGNOSIS — E1121 Type 2 diabetes mellitus with diabetic nephropathy: Secondary | ICD-10-CM

## 2021-07-07 NOTE — Progress Notes (Addendum)
Chronic Care Management Pharmacy Note  07/07/2021 Name:  LARK LANGENFELD MRN:  [id number] DOB:  1951-07-01  Summary: addressed HTN, HLD, and primarily DM. She is thrilled with her CGM monitor, and states her readings as follows: - Average BG last 7 days= 140, last 14 days = 139.  - Lowest BG 79, 81 - Highest BG 204 (birthday party for granddaughter, cake) Currently using lantus 16 units daily, and hasn't needed short acting other than 2-3 times per week.  Recommendations/Changes made from today's visit: Continue current regimen & begin ozempic 0.40m weekly using sample. Cost assistance for BOTH ozempic + insulin is pending.   Plan: f/u with pharmacist in 1 month  Subjective: JRIELYN KRUPINSKIis an 70y.o. year old female who is a primary patient of [name], CRene Kocher MD.  The CCM team was consulted for assistance with disease management and care coordination needs.    Engaged with patient by telephone for follow up visit in response to provider referral for pharmacy case management and/or care coordination services.   Consent to Services:  The patient was given information about Chronic Care Management services, agreed to services, and gave verbal consent prior to initiation of services.  Please see initial visit note for detailed documentation.   Patient Care Team: MHali Marry MD as PCP - General (Family Medicine) KMarcy Panning MD (Hematology and Oncology) CLelon Perla MD as Attending Physician (Cardiology) TEvans Lance MD (Cardiology) Dr. LCaprice Kluver(Otolaryngology) FAmada Kingfisher MD as Consulting Physician (Hematology and Oncology) AGerre Pebbles MD as Referring Physician (Gastroenterology) FHaynes Dage OD as Referring Physician (Optometry) KDarius Bump [facility] Pharmacist (Pharmacist)   Objective:  Lab Results  Component Value Date   CREATININE 0.94 04/16/2021   CREATININE 0.89 03/26/2020   CREATININE 0.82 10/20/2019    Lab Results   Component Value Date   HGBA1C 6.4 (A) 04/16/2021   Last diabetic Eye exam:  Lab Results  Component Value Date/Time   HMDIABEYEEXA No Retinopathy 06/11/2021 12:00 AM    Last diabetic Foot exam: No results found for: HMDIABFOOTEX      Component Value Date/Time   CHOL 168 04/16/2021 0000   TRIG 120 04/16/2021 0000   HDL 73 04/16/2021 0000   CHOLHDL 2.3 04/16/2021 0000   VLDL 26 02/18/2017 0939   LDLCALC 74 04/16/2021 0000    Hepatic Function Latest Ref Rng & Units 04/16/2021 03/26/2020 10/20/2019  Total Protein 6.1 - 8.1 g/dL 7.1 6.6 6.4  Albumin 3.5 - 5.0 g/dL - - -  AST 10 - 35 U/L 22 20 19   ALT 6 - 29 U/L 18 16 15   Alk Phosphatase 38 - 126 U/L - - -  Total Bilirubin 0.2 - 1.2 mg/dL 0.4 0.4 0.3  Bilirubin, Direct 0.0 - 0.3 mg/dL - - -    Lab Results  Component Value Date/Time   TSH 5.96 (H) 07/03/2021 08:29 AM   TSH 3.26 06/02/2021 12:00 AM    CBC Latest Ref Rng & Units 04/16/2021 03/14/2019 12/28/2018  WBC 3.8 - 10.8 Thousand/uL 5.4 6.5 7.8  Hemoglobin 11.7 - 15.5 g/dL 12.2 14.1 15.0  Hematocrit 35.0 - 45.0 % 38.0 42.7 45.2(H)  Platelets 140 - 400 Thousand/uL 299 287 357    Lab Results  Component Value Date/Time   VD25OH 65 05/04/2014 02:27 PM   VD25OH 61 12/29/2010 08:05 AM    Clinical ASCVD: Yes  The 10-year ASCVD risk score (Arnett DK, et al., 2019) is: 32%  Values used to calculate the score:     Age: 70 years     Sex: Female     Is Non-Hispanic African American: No     Diabetic: Yes     Tobacco smoker: No     Systolic Blood Pressure: 193 mmHg     Is BP treated: Yes     HDL Cholesterol: 73 mg/dL     Total Cholesterol: 168 mg/dL      Social History   Tobacco Use  Smoking Status Former   Packs/day: 0.00   Years: 0.00   Pack years: 0.00   Types: Cigarettes   Quit date: 08/24/1966   Years since quitting: 54.9  Smokeless Tobacco Never   BP Readings from Last 3 Encounters:  04/16/21 (!) 132/57  03/08/21 (!) 154/78  02/25/21 (!) 122/56   Pulse  Readings from Last 3 Encounters:  05/13/21 88  04/16/21 88  03/08/21 83   Wt Readings from Last 3 Encounters:  04/16/21 219 lb (99.3 kg)  03/08/21 215 lb (97.5 kg)  02/25/21 220 lb 1.9 oz (99.8 kg)    Assessment: Review of patient past medical history, allergies, medications, health status, including review of consultants reports, laboratory and other test data, was performed as part of comprehensive evaluation and provision of chronic care management services.   SDOH:  (Social Determinants of Health) assessments and interventions performed:    CCM Care Plan  Allergies  Allergen Reactions   Penicillins Anaphylaxis    Has patient had a PCN reaction causing immediate rash, facial/tongue/throat swelling, SOB or lightheadedness with hypotension: Yes Has patient had a PCN reaction causing severe rash involving mucus membranes or skin necrosis: No Has patient had a PCN reaction that required hospitalization: No Has patient had a PCN reaction occurring within the last 10 years: No If all of the above answers are "NO", then may proceed with Cephalosporin use.    Accupril [Quinapril Hcl] Other (See Comments)    Hyperkalemia   Atorvastatin Other (See Comments)    msucle aches   Celebrex [Celecoxib]     ulcers   Fluticasone Other (See Comments)    Nasal ulcer.     Levocetirizine     Doesn't recall   Levomenol Other (See Comments)    unknown unknown    Losartan Potassium Itching   Naproxen Swelling   Nasonex [Mometasone Furoate] Other (See Comments)    nosebleed   Chamomile Other (See Comments)    unknown   Vesicare [Solifenacin] Other (See Comments)    "stopped me from urinating"   Aspirin Other (See Comments)    Pt states "burns holes in stomach", ulcers    Baking Soda-Fluoride [Sodium Fluoride] Itching   Clindamycin/Lincomycin Rash   Codeine Nausea And Vomiting   Furosemide Other (See Comments)    Nose bleed   Lactose    Lactose Intolerance (Gi) Other (See Comments)     Increased calcium, sneezing, nasal issues   Lanolin-Petrolatum Rash   Metformin And Related Diarrhea   Miralax [Polyethylene Glycol 3350]    Miralax [Polyethylene Glycol] Rash   Nasonex [Mometasone Furoate] Other (See Comments)   Quinine Rash   Rifampin Other (See Comments)    DOES NOT REMEMBER THIS MEDICATION OR ALLERGY   Sulfa Antibiotics Rash   Sulfonamide Derivatives Rash   Tramadol Rash    Anxiety   Wellbutrin [Bupropion] Other (See Comments)    Muscle aches, pain, "made my skin crawl"   Wool Alcohol [Lanolin] Rash   Zoledronic Acid Other (  See Comments) and Rash    shaking    Medications Reviewed Today     Reviewed by Hali Marry, MD (Physician) on 05/13/21 at [phone]  Med List Status: <None>   Medication Order Taking? Sig Documenting Provider Last Dose Status Informant  Accu-Chek FastClix Lancets MISC [id number] No Apply topically. [provider] Taking Active   alendronate (FOSAMAX) 70 MG tablet [id number] No Take 1 tablet (70 mg total) by mouth every 7 (seven) days. Take with a full glass of water on an empty stomach. Hali Marry, MD Taking Active   Camillo Flaming MEDICATION [id number] No Medication Name: Tubing and supplies for nebulizer. Fax - [phone] Hali Marry, MD Taking Active   amiodarone (PACERONE) 200 MG tablet [id number] No TAKE ONE-HALF TABLET BY  MOUTH DAILY Evans Lance, MD Taking Active   blood glucose meter kit and supplies KIT [id number] No Dispense based on patient and insurance preference. Hali Marry, MD Taking Active   bumetanide [facility]) 1 MG tablet [id number] No TAKE 1 TABLET BY MOUTH  TWICE DAILY Hali Marry, MD Taking Active   clotrimazole-betamethasone (LOTRISONE) cream [id number] No  [provider] Taking Active   dicyclomine (BENTYL) 20 MG tablet [id number] No Take by mouth. [provider] Taking Active   diltiazem (CARDIZEM CD) 180 MG 24 hr capsule [id number] No  TAKE 1 CAPSULE BY MOUTH  DAILY Hali Marry, MD Taking Active   EPIPEN 2-PAK 0.3 MG/0.3ML SOAJ injection [id number] No INJECT 1 PEN INTO MUSCLE OF LATERAL THIGH FOR SEVERE ALLERGIC REACTION. CALL 911 AFTER USING THIS MEDICATION. Hali Marry, MD Taking Active   fexofenadine-pseudoephedrine (ALLEGRA-D 24) 180-240 MG 24 hr tablet [id number] No Take 1 tablet by mouth daily. [provider] Taking Active Self  fluticasone-salmeterol (ADVAIR HFA) 812-75 MCG/ACT inhaler [id number] No Inhale 2 puffs into the lungs 2 (two) times daily. [provider] Taking Expired 02/25/21 2359   glucose blood (ACCU-CHEK GUIDE) test strip [id number] No Dx DM E11.21 - Check fasting blood sugar every morning before breakfast and before lunch and dinner. 3 times daily Hali Marry, MD Taking Active   insulin glargine (LANTUS SOLOSTAR) 100 UNIT/ML Solostar Pen [id number] No Inject 40 Units into the skin at bedtime.  Patient taking differently: Inject 40 Units into the skin at bedtime. Takes 20 units at bedtime.   Hali Marry, MD Taking Active   insulin lispro (HUMALOG KWIKPEN) 100 UNIT/ML KwikPen [id number] No Inject 3-12 Units into the skin 3 (three) times daily as needed (hyperglycemia>150). Sliding scale. Administer subQ within 15 minutes before or immediately after a meal Hali Marry, MD Taking Active   Insulin Pen Needle 31G X 8 MM MISC [id number] No To be used for injecting insulin Dx: E11.8 Hali Marry, MD Taking Active   Lancets Misc. (ACCU-CHEK FASTCLIX LANCET) KIT [id number] No E11.21 Dx DM Check fasting blood sugar every morning before breakfast and before lunch and dinner. 3 times daily Hali Marry, MD Taking Active   levalbuterol Penne Lash) 1.25 MG/3ML nebulizer solution [id number] Yes Take 1.25 mg by nebulization every 6 (six) hours as needed for wheezing. Hali Marry, MD  Active   levothyroxine (SYNTHROID) 75 MCG tablet [id number] No  TAKE 1 TABLET BY MOUTH  DAILY Hali Marry, MD Taking Active   LINZESS 72 MCG capsule [id number] No Take 72 mcg by mouth 2 (two) times daily. [provider] Taking Active   Multiple Vitamins-Minerals ([place] + [place]) TABS [id number]  No Take 1 tablet by mouth daily. [provider] Taking Active Self  Olopatadine HCl 0.2 % SOLN [id number] No Place 1 drop into both eyes daily. [provider] Taking Active   pantoprazole (PROTONIX) 40 MG tablet [id number] No Take 40 mg by mouth daily. [provider] Taking Active   pravastatin (PRAVACHOL) 40 MG tablet [id number] No Take 1 tablet (40 mg total) by mouth at bedtime. Hali Marry, MD Taking Active   PROAIR HFA 108 801 423 2651 Base) MCG/ACT inhaler [id number] No USE 1 TO 2 INHALATIONS BY  MOUTH EVERY 6 HOURS AS  NEEDED FOR WHEEZING OR  SHORTNESS OF BREATH Hali Marry, MD Taking Active   RESTASIS 0.05 % ophthalmic emulsion [id number] No  [provider] Taking Active   Tiotropium Bromide Monohydrate 2.5 MCG/ACT AERS [id number] No Inhale 2 puffs into the lungs daily. [provider] Taking Expired 02/25/21 2359   valACYclovir (VALTREX) 1000 MG tablet [id number] No TAKE 1 TABLET BY MOUTH TWICE DAILY Hali Marry, MD Taking Active             Patient Active Problem List   Diagnosis Date Noted   Osteoporosis 02/28/2021   Primary osteoarthritis of left knee 01/14/2021   Irritable bowel syndrome with constipation 09/16/2020   Aortic atherosclerosis ([place]) 06/11/2020   Benign lipomatous neoplasm of skin and subcutaneous tissue of left leg 06/11/2020   Paroxysmal VT 02/14/2020   Chronic constipation 10/06/2019   Moderate persistent asthma with exacerbation 08/29/2019   Genetic testing 08/14/2019   Ductal carcinoma in situ (DCIS) of right breast 06/15/2019   Arthralgia 04/11/2019   Malignant tumor of breast ([name]) 03/06/2019   Fatigue 03/06/2019   Status post total shoulder  arthroplasty, right 03/10/2018   DDD (degenerative disc disease), cervical 11/19/2017   Bradycardia with less than 60 beats per minute 11/02/2017   Peripheral edema 05/19/2017   Microcytic anemia 05/14/2017   Delayed sleep phase syndrome 10/29/2016   Primary osteoarthritis of left ankle 09/24/2016   Gastrointestinal food sensitivity 05/21/2016   History of arthroplasty of right shoulder 02/24/2016   Bilateral foot pain 01/22/2016   NASH (nonalcoholic steatohepatitis) 05/26/2015    Class: Stage 3   Nasal septal perforation 07/11/2014   Controlled diabetes mellitus type 2 with complications ([place]) 30/04/2329   Physical deconditioning 04/24/2014   Obesity, Class II, BMI 35-39.9, with comorbidity 06/09/2013   OSA (obstructive sleep apnea) 07/62/2633   Chronic systolic heart failure ([place]) 05/23/2013   Asthma, moderate persistent 04/18/2013   Chronic cough 04/18/2013   Breast cancer of upper-inner quadrant of left female breast ([place]) 03/18/2010   Hyperlipidemia 11/07/2009   Fibromyalgia 10/29/2009   Complete heart block ([place]) s/p MDT BiVP 10/23/2009   PACEMAKER, PERMANENT 10/23/2009   ALKALINE PHOSPHATASE, ELEVATED 10/13/2009   Hypothyroidism 10/03/2009   Benign essential hypertension 07/02/2009   Chronic back pain 07/02/2009    Immunization History  Administered Date(s) Administered   Fluad Quad(high Dose 65+) 04/11/2019, 05/02/2020   Influenza Split 05/10/2012, 05/04/2014   Influenza Whole 06/14/2009, 05/08/2010, 04/24/2011   Influenza, High Dose Seasonal PF 05/17/2017, 05/24/2018, 04/11/2019, 05/02/2020   Influenza,inj,Quad PF,6+ Mos 04/28/2013, 05/04/2014, 05/06/2015, 05/18/2016   Moderna Sars-Covid-2 Vaccination 11/01/2019, 11/29/2019, 07/19/2020   Pneumococcal Conjugate-13 05/25/2014   Pneumococcal Polysaccharide-23 11/28/2012, 12/28/2018   Td 08/24/2004, 03/07/2010   Tdap 07/14/2012   Zoster Recombinat (Shingrix) 10/06/2017, 01/16/2020   Zoster, Live 04/24/2011     Conditions to be addressed/monitored: HTN, HLD, and DMII  There are no care plans that  you recently modified to display for this patient.    Medication Assistance:  Pending  Patient's preferred pharmacy is:  [id number] - [place], [place] - [address] AT [place] [address] [place] [address] Phone: [phone] Fax: [phone]  [facility] ([place], [place] [facility] [place] [address] Phone: [phone] Fax: [facility] [phone] - [place], [place] [address] [place] [address] Phone: [phone] Fax: [phone]  [facility] ([facility]) - [place], [place] [address] Phone: [phone] Fax: [phone]  Uses pill box? Yes Pt endorses 100% compliance  Follow Up:  Patient agrees to Care Plan and Follow-up.  Plan: Telephone follow up appointment with care management team member scheduled for:  1 month  Larinda Buttery, PharmD Clinical Pharmacist [facility] [phone]

## 2021-07-08 NOTE — Patient Instructions (Signed)
Visit Information  Patient Goals/Self-Care Activities Over the next 30 days, patient will:  take medications as prescribed  Follow Up Plan: Telephone follow up appointment with care management team member scheduled for: 1 month  Patient verbalizes understanding of instructions provided today and agrees to view in [place].   Telephone follow up appointment with care management team member scheduled for: 1 month  Darius Bump

## 2021-07-21 ENCOUNTER — Encounter: Payer: Self-pay | Admitting: Family Medicine

## 2021-07-21 ENCOUNTER — Ambulatory Visit (INDEPENDENT_AMBULATORY_CARE_PROVIDER_SITE_OTHER): Payer: Medicare Other | Admitting: Family Medicine

## 2021-07-21 ENCOUNTER — Ambulatory Visit (INDEPENDENT_AMBULATORY_CARE_PROVIDER_SITE_OTHER): Payer: Medicare Other

## 2021-07-21 ENCOUNTER — Other Ambulatory Visit: Payer: Self-pay

## 2021-07-21 VITALS — BP 126/66 | HR 94 | Ht 61.0 in | Wt 217.0 lb

## 2021-07-21 DIAGNOSIS — R0902 Hypoxemia: Secondary | ICD-10-CM

## 2021-07-21 DIAGNOSIS — E038 Other specified hypothyroidism: Secondary | ICD-10-CM | POA: Diagnosis not present

## 2021-07-21 DIAGNOSIS — I10 Essential (primary) hypertension: Secondary | ICD-10-CM | POA: Diagnosis not present

## 2021-07-21 DIAGNOSIS — E118 Type 2 diabetes mellitus with unspecified complications: Secondary | ICD-10-CM

## 2021-07-21 DIAGNOSIS — M79675 Pain in left toe(s): Secondary | ICD-10-CM

## 2021-07-21 LAB — POCT GLYCOSYLATED HEMOGLOBIN (HGB A1C): Hemoglobin A1C: 6.4 % — AB (ref 4.0–5.6)

## 2021-07-21 NOTE — Patient Instructions (Signed)
Please come here to get your labs drawn in 4 weeks to recheck your thyroid.

## 2021-07-21 NOTE — Assessment & Plan Note (Signed)
A1c looks great today at 6.4 just started Ozempic about 2 weeks ago and she is completely stopped her insulin.  Right now we will continue to hold and continue with the Ozempic as she is tolerating it well thus far.  I will see her back in 3 months.  Problem call if any problems or side effects.  Okay to go up to 0.5 mg after 1 month.

## 2021-07-21 NOTE — Assessment & Plan Note (Signed)
Well controlled. Continue current regimen. Follow up in  3-4  Mo

## 2021-07-21 NOTE — Progress Notes (Signed)
Established Patient Office Visit  Subjective:  Patient ID: Tanya Hogan, female    DOB: 01-01-1951  Age: 70 y.o. MRN: [id number]  CC:  Chief Complaint  Patient presents with   Diabetes    HPI Tanya Hogan presents for   Diabetes - no hypoglycemic events. No wounds or sores that are not healing well. No increased thirst or urination. Checking glucose at home. Taking medications as prescribed without any side effects.  She also reports that she has noticed occasionally that her oxygen will drop down to 90% especially in the morning sometimes with just getting up and walking across the room she feels like a lot of it has to do with her nasal congestion because when she does her sinus rinse her oxygen will usually go back up to 97.  Hypothyroidism - Taking medication regularly in the AM away from food and vitamins, etc. No recent change to skin, hair, or energy levels.  We just increased her dose to 88 mcg daily about 2 weeks ago.    She is hoping that getting her thyroid regulated will also help with her bowels.  She injured her left great toe about a month ago.  She says ever since she got her new diabetic shoes she is actually fallen a couple of times she feels like they might actually be too big.  She has a large callus that usually gets debrided by podiatry every 6 weeks on that great toe and she says that is the area that is been hurting ever since the fall she thinks she may have actually twisted her foot.  She says it hurts worse when she gets off her foot after walking it will just ache.  Past Medical History:  Diagnosis Date   Alkaline phosphatase elevation    Asthma    Breast cancer ([place]) 2011   Carpal tunnel syndrome    Carpal tunnel syndrome    bilateral   CHF (congestive heart failure) (HCC)    Cirrhosis, non-alcoholic (HCC)    Closed fracture of unspecified part of radius (alone)    DDD (degenerative disc disease), lumbar    Depression    pt denies   Diabetes  mellitus without complication ([place])    Enlarged heart    Fibromyalgia    GERD (gastroesophageal reflux disease)    History of kidney stones    HTN (hypertension)    Hypothyroidism    IBS (irritable bowel syndrome)    LBBB (left bundle branch block)    Microcytic anemia 05/14/2017   Pneumonia    Presence of permanent cardiac pacemaker    PVD (peripheral vascular disease) ([place])    2019   Sleep apnea    uses cpap    Past Surgical History:  Procedure Laterality Date   BREAST LUMPECTOMY Left 2011   COLONOSCOPY     FOOT SURGERY     KNEE SURGERY     MASTECTOMY Right    05/2019   PACEMAKER GENERATOR CHANGE N/A 08/23/2014   Procedure: PACEMAKER GENERATOR CHANGE;  Surgeon: Evans Lance, MD;  Location: [facility] CATH LAB;  Service: Cardiovascular;  Laterality: N/A;   PACEMAKER PLACEMENT  2009   [facility]   RECTAL SURGERY  1976   REDUCTION MAMMAPLASTY Left    05/2019   TOTAL SHOULDER ARTHROPLASTY Right 03/10/2018   TOTAL SHOULDER ARTHROPLASTY Right 03/10/2018   Procedure: RIGHT TOTAL SHOULDER ARTHROPLASTY;  Surgeon: [name], MD;  Location: [place];  Service: Orthopedics;  Laterality: Right;  TUBAL LIGATION      Family History  Problem Relation Age of Onset   Heart disease Mother    Melanoma Mother    Parkinson's disease Mother    Heart disease Father    Heart disease Brother    Heart failure Other    Breast cancer Other    Hypertension Other     Social History   Socioeconomic History   Marital status: Divorced    Spouse name: Not on file   Number of children: 2   Years of education: 16   Highest education level: Associate degree: academic program  Occupational History   Occupation: quality insurcance in [employer]    Comment: retired  Tobacco Use   Smoking status: Former    Packs/day: 0.00    Years: 0.00    Pack years: 0.00    Types: Cigarettes    Quit date: 08/24/1966    Years since quitting: 54.9   Smokeless tobacco: Never  Vaping Use    Vaping Use: Never used  Substance and Sexual Activity   Alcohol use: No   Drug use: No   Sexual activity: Not Currently  Other Topics Concern   Not on file  Social History Narrative   Retired. Lives with and helps her brother. Likes Systems developer, Social worker and music. Loves animals.   Social Determinants of Health   Financial Resource Strain: Low Risk    Difficulty of Paying Living Expenses: Not hard at all  Food Insecurity: No Food Insecurity   Worried About Charity fundraiser in the Last Year: Never true   [place] in the Last Year: Never true  Transportation Needs: No Transportation Needs   Lack of Transportation (Medical): No   Lack of Transportation (Non-Medical): No  Physical Activity: Inactive   Days of Exercise per Week: 0 days   Minutes of Exercise per Session: 0 min  Stress: No Stress Concern Present   Feeling of Stress : Not at all  Social Connections: Socially Isolated   Frequency of Communication with Friends and Family: More than three times a week   Frequency of Social Gatherings with Friends and Family: Once a week   Attends Religious Services: Never   Marine scientist or Organizations: No   Attends Music therapist: Never   Marital Status: Divorced  Human resources officer Violence: Not At Risk   Fear of Current or Ex-Partner: No   Emotionally Abused: No   Physically Abused: No   Sexually Abused: No    Outpatient Medications Prior to Visit  Medication Sig Dispense Refill   albuterol (PROVENTIL) (2.5 MG/3ML) 0.083% nebulizer solution Take 2.5 mg by nebulization every 6 (six) hours as needed for wheezing or shortness of breath.     albuterol (VENTOLIN HFA) 108 (90 Base) MCG/ACT inhaler Inhale 1-2 puffs into the lungs every 6 (six) hours as needed for wheezing or shortness of breath. 54 g 3   alendronate (FOSAMAX) 70 MG tablet Take 1 tablet (70 mg total) by mouth every 7 (seven) days. Take with a full glass of water on an empty  stomach. 12 tablet 4   AMBULATORY NON FORMULARY MEDICATION Medication Name: Tubing and supplies for nebulizer. Fax - [phone] 1 vial 0   amiodarone (PACERONE) 200 MG tablet TAKE ONE-HALF TABLET BY  MOUTH DAILY 45 tablet 2   B Complex-C (B-COMPLEX WITH VITAMIN C) tablet Take 1 tablet by mouth daily.     bumetanide (BUMEX) 1 MG tablet  TAKE 1 TABLET BY MOUTH  TWICE DAILY 180 tablet 3   Calcium Citrate-Vitamin D (CITRACAL + D PO) Take 1 tablet by mouth daily.     Continuous Blood Gluc Receiver (FREESTYLE LIBRE 2 READER) DEVI by Does not apply route.     Continuous Blood Gluc Sensor (FREESTYLE LIBRE 2 SENSOR) MISC by Does not apply route.     dicyclomine (BENTYL) 20 MG tablet Take by mouth.     diltiazem (CARDIZEM CD) 180 MG 24 hr capsule TAKE 1 CAPSULE BY MOUTH  DAILY 90 capsule 3   EPIPEN 2-PAK 0.3 MG/0.3ML SOAJ injection INJECT 1 PEN INTO MUSCLE OF LATERAL THIGH FOR SEVERE ALLERGIC REACTION. CALL 911 AFTER USING THIS MEDICATION. 1 each 2   ferrous sulfate 324 MG TBEC Take 324 mg by mouth.     fexofenadine-pseudoephedrine (ALLEGRA-D 24) 180-240 MG 24 hr tablet Take 1 tablet by mouth daily.     Insulin Pen Needle 31G X 8 MM MISC To be used for injecting insulin daily at bedtime. Dx: E11.8 100 each 3   levothyroxine (SYNTHROID) 88 MCG tablet Take 1 tablet (88 mcg total) by mouth daily. 90 tablet 0   LINZESS 72 MCG capsule Take 72 mcg by mouth 2 (two) times daily.     Multiple Vitamins-Minerals (OCUVITE EYE + MULTI) TABS Take 1 tablet by mouth daily.     pantoprazole (PROTONIX) 40 MG tablet Take 40 mg by mouth daily.     pravastatin (PRAVACHOL) 40 MG tablet Take 1 tablet (40 mg total) by mouth at bedtime. 90 tablet 1   Probiotic Product (PROBIOTIC BLEND PO) Take 1 tablet by mouth daily.     Semaglutide,0.25 or 0.5MG/DOS, (OZEMPIC, 0.25 OR 0.5 MG/DOSE,) 2 MG/1.5ML SOPN Inject 0.25 mg into the skin.     insulin lispro (HUMALOG KWIKPEN) 100 UNIT/ML KwikPen Inject 3-12 Units into the skin 3 (three)  times daily as needed (hyperglycemia>150). Sliding scale. Administer subQ within 15 minutes before or immediately after a meal 9 mL 2   LANTUS SOLOSTAR 100 UNIT/ML Solostar Pen INJECT SUBCUTANEOUSLY 40  UNITS AT BEDTIME (Patient taking differently: Inject 16 Units into the skin daily.) 45 mL 3   fluticasone-salmeterol (ADVAIR HFA) 115-21 MCG/ACT inhaler Inhale 2 puffs into the lungs 2 (two) times daily.     Tiotropium Bromide Monohydrate 2.5 MCG/ACT AERS Inhale 2 puffs into the lungs daily.     Accu-Chek FastClix Lancets MISC Apply topically.     blood glucose meter kit and supplies KIT Dispense based on patient and insurance preference. 1 each 0   clotrimazole-betamethasone (LOTRISONE) cream  (Patient not taking: Reported on 07/07/2021)     glucose blood (ACCU-CHEK GUIDE) test strip Dx DM E11.21 - Check fasting blood sugar every morning before breakfast and before lunch and dinner. 3 times daily 900 each prn   Lancets Misc. (ACCU-CHEK FASTCLIX LANCET) KIT E11.21 Dx DM Check fasting blood sugar every morning before breakfast and before lunch and dinner. 3 times daily 300 kit prn   Olopatadine HCl 0.2 % SOLN Place 1 drop into both eyes daily. (Patient not taking: Reported on 07/07/2021)     RESTASIS 0.05 % ophthalmic emulsion  (Patient not taking: Reported on 07/07/2021)     valACYclovir (VALTREX) 1000 MG tablet TAKE 1 TABLET BY MOUTH TWICE DAILY (Patient not taking: No sig reported) 14 tablet 2   No facility-administered medications prior to visit.    Allergies  Allergen Reactions   Penicillins Anaphylaxis    Has patient had a  PCN reaction causing immediate rash, facial/tongue/throat swelling, SOB or lightheadedness with hypotension: Yes Has patient had a PCN reaction causing severe rash involving mucus membranes or skin necrosis: No Has patient had a PCN reaction that required hospitalization: No Has patient had a PCN reaction occurring within the last 10 years: No If all of the above answers  are "NO", then may proceed with Cephalosporin use.    Accupril [Quinapril Hcl] Other (See Comments)    Hyperkalemia   Atorvastatin Other (See Comments)    msucle aches   Celebrex [Celecoxib]     ulcers   Fluticasone Other (See Comments)    Nasal ulcer.     Levocetirizine     Doesn't recall   Levomenol Other (See Comments)    unknown unknown    Losartan Potassium Itching   Naproxen Swelling   Nasonex [Mometasone Furoate] Other (See Comments)    nosebleed   Chamomile Other (See Comments)    unknown   Vesicare [Solifenacin] Other (See Comments)    "stopped me from urinating"   Aspirin Other (See Comments)    Pt states "burns holes in stomach", ulcers    Baking Soda-Fluoride [Sodium Fluoride] Itching   Clindamycin/Lincomycin Rash   Codeine Nausea And Vomiting   Furosemide Other (See Comments)    Nose bleed   Lactose    Lactose Intolerance (Gi) Other (See Comments)    Increased calcium, sneezing, nasal issues   Lanolin-Petrolatum Rash   Metformin And Related Diarrhea   Miralax [Polyethylene Glycol 3350]    Miralax [Polyethylene Glycol] Rash   Nasonex [Mometasone Furoate] Other (See Comments)   Quinine Rash   Rifampin Other (See Comments)    DOES NOT REMEMBER THIS MEDICATION OR ALLERGY   Sulfa Antibiotics Rash   Sulfonamide Derivatives Rash   Tramadol Rash    Anxiety   Wellbutrin [Bupropion] Other (See Comments)    Muscle aches, pain, "made my skin crawl"   Wool Alcohol [Lanolin] Rash   Zoledronic Acid Other (See Comments) and Rash    shaking    ROS Review of Systems    Objective:    Physical Exam Constitutional:      Appearance: Normal appearance. She is well-developed.  HENT:     Head: Normocephalic and atraumatic.  Cardiovascular:     Rate and Rhythm: Normal rate and regular rhythm.     Heart sounds: Normal heart sounds.  Pulmonary:     Effort: Pulmonary effort is normal.     Breath sounds: Normal breath sounds.  Musculoskeletal:     Comments: Great  toe is tender diffusely including in between and over the joints.  She does have a thick callus medially.  Skin:    General: Skin is warm and dry.  Neurological:     Mental Status: She is alert and oriented to person, place, and time.  Psychiatric:        Behavior: Behavior normal.    BP 126/66   Pulse 94   Ht 5' 1"  (1.549 m)   Wt 217 lb (98.4 kg)   SpO2 95%   BMI 41.00 kg/m  Wt Readings from Last 3 Encounters:  07/21/21 217 lb (98.4 kg)  04/16/21 219 lb (99.3 kg)  03/08/21 215 lb (97.5 kg)     Health Maintenance Due  Topic Date Due   COVID-19 Vaccine (4 - Booster for Moderna series) 09/13/2020   URINE MICROALBUMIN  09/16/2021    There are no preventive care reminders to display for this patient.  Lab  Results  Component Value Date   TSH 5.96 (H) 07/03/2021   Lab Results  Component Value Date   WBC 5.4 04/16/2021   HGB 12.2 04/16/2021   HCT 38.0 04/16/2021   MCV 90.9 04/16/2021   PLT 299 04/16/2021   Lab Results  Component Value Date   NA 142 04/16/2021   K 4.0 04/16/2021   CHLORIDE 105 10/30/2014   CO2 29 04/16/2021   GLUCOSE 118 (H) 04/16/2021   BUN 14 04/16/2021   CREATININE 0.94 04/16/2021   BILITOT 0.4 04/16/2021   ALKPHOS 100 03/02/2018   AST 22 04/16/2021   ALT 18 04/16/2021   PROT 7.1 04/16/2021   ALBUMIN 4.0 03/02/2018   CALCIUM 10.0 04/16/2021   ANIONGAP 8 03/11/2018   EGFR 65 04/16/2021   Lab Results  Component Value Date   CHOL 168 04/16/2021   Lab Results  Component Value Date   HDL 73 04/16/2021   Lab Results  Component Value Date   LDLCALC 74 04/16/2021   Lab Results  Component Value Date   TRIG 120 04/16/2021   Lab Results  Component Value Date   CHOLHDL 2.3 04/16/2021   Lab Results  Component Value Date   HGBA1C 6.4 (A) 07/21/2021      Assessment & Plan:   Problem List Items Addressed This Visit       Cardiovascular and Mediastinum   Benign essential hypertension    Well controlled. Continue current  regimen. Follow up in  3-4  Mo         Endocrine   Hypothyroidism    Due to recheck TSH in 4 weeks.    Lab Results  Component Value Date   TSH 5.96 (H) 07/03/2021         Controlled diabetes mellitus type 2 with complications (HCC) - Primary    A1c looks great today at 6.4 just started Ozempic about 2 weeks ago and she is completely stopped her insulin.  Right now we will continue to hold and continue with the Ozempic as she is tolerating it well thus far.  I will see her back in 3 months.  Problem call if any problems or side effects.  Okay to go up to 0.5 mg after 1 month.      Relevant Medications   Semaglutide,0.25 or 0.5MG/DOS, (OZEMPIC, 0.25 OR 0.5 MG/DOSE,) 2 MG/1.5ML SOPN   Other Relevant Orders   POCT glycosylated hemoglobin (Hb A1C) (Completed)   Other Visit Diagnoses     Hypoxemia       Great toe pain, left       Relevant Orders   DG Toe Great Left       No orders of the defined types were placed in this encounter.   Follow-up: Return in about 3 months (around 10/21/2021) for Diabetes follow-up.    Beatrice Lecher, MD

## 2021-07-21 NOTE — Assessment & Plan Note (Signed)
Due to recheck TSH in 4 weeks.    Lab Results  Component Value Date   TSH 5.96 (H) 07/03/2021

## 2021-07-22 DIAGNOSIS — G4737 Central sleep apnea in conditions classified elsewhere: Secondary | ICD-10-CM | POA: Diagnosis not present

## 2021-07-22 DIAGNOSIS — G4733 Obstructive sleep apnea (adult) (pediatric): Secondary | ICD-10-CM | POA: Diagnosis not present

## 2021-07-22 NOTE — Progress Notes (Signed)
H iJudy,  Xray of your to looks good.  No sign of fracture.  I would just try to ice and massage the area as tolerated.  Try to keep pressure off that toe is much as possible.  You can also speak with the podiatrist about it when you see them for the debulking of your calluses.

## 2021-07-23 DIAGNOSIS — H35432 Paving stone degeneration of retina, left eye: Secondary | ICD-10-CM | POA: Diagnosis not present

## 2021-07-23 DIAGNOSIS — E1169 Type 2 diabetes mellitus with other specified complication: Secondary | ICD-10-CM

## 2021-07-23 DIAGNOSIS — H527 Unspecified disorder of refraction: Secondary | ICD-10-CM | POA: Diagnosis not present

## 2021-07-23 DIAGNOSIS — H25813 Combined forms of age-related cataract, bilateral: Secondary | ICD-10-CM | POA: Diagnosis not present

## 2021-07-23 DIAGNOSIS — E1121 Type 2 diabetes mellitus with diabetic nephropathy: Secondary | ICD-10-CM

## 2021-07-23 DIAGNOSIS — H353131 Nonexudative age-related macular degeneration, bilateral, early dry stage: Secondary | ICD-10-CM | POA: Diagnosis not present

## 2021-07-23 DIAGNOSIS — H52203 Unspecified astigmatism, bilateral: Secondary | ICD-10-CM | POA: Diagnosis not present

## 2021-07-23 DIAGNOSIS — H53001 Unspecified amblyopia, right eye: Secondary | ICD-10-CM | POA: Diagnosis not present

## 2021-07-23 DIAGNOSIS — E785 Hyperlipidemia, unspecified: Secondary | ICD-10-CM

## 2021-07-23 DIAGNOSIS — I10 Essential (primary) hypertension: Secondary | ICD-10-CM

## 2021-07-23 DIAGNOSIS — Z794 Long term (current) use of insulin: Secondary | ICD-10-CM

## 2021-07-23 DIAGNOSIS — H04123 Dry eye syndrome of bilateral lacrimal glands: Secondary | ICD-10-CM | POA: Diagnosis not present

## 2021-07-23 DIAGNOSIS — E119 Type 2 diabetes mellitus without complications: Secondary | ICD-10-CM | POA: Diagnosis not present

## 2021-07-23 LAB — HM DIABETES EYE EXAM

## 2021-07-24 DIAGNOSIS — R040 Epistaxis: Secondary | ICD-10-CM | POA: Diagnosis not present

## 2021-07-24 DIAGNOSIS — J3489 Other specified disorders of nose and nasal sinuses: Secondary | ICD-10-CM | POA: Diagnosis not present

## 2021-07-24 DIAGNOSIS — J019 Acute sinusitis, unspecified: Secondary | ICD-10-CM | POA: Diagnosis not present

## 2021-07-24 DIAGNOSIS — J342 Deviated nasal septum: Secondary | ICD-10-CM | POA: Diagnosis not present

## 2021-07-27 ENCOUNTER — Other Ambulatory Visit: Payer: Self-pay | Admitting: Family Medicine

## 2021-07-27 DIAGNOSIS — I700 Atherosclerosis of aorta: Secondary | ICD-10-CM

## 2021-07-28 ENCOUNTER — Other Ambulatory Visit: Payer: Self-pay | Admitting: Family Medicine

## 2021-07-29 ENCOUNTER — Telehealth (INDEPENDENT_AMBULATORY_CARE_PROVIDER_SITE_OTHER): Payer: Medicare Other | Admitting: Family Medicine

## 2021-07-29 ENCOUNTER — Encounter: Payer: Self-pay | Admitting: Family Medicine

## 2021-07-29 VITALS — BP 111/68 | HR 95 | Temp 99.4°F | Wt 217.0 lb

## 2021-07-29 DIAGNOSIS — J0140 Acute pansinusitis, unspecified: Secondary | ICD-10-CM | POA: Diagnosis not present

## 2021-07-29 NOTE — Progress Notes (Signed)
Virtual Visit via Telephone Note  I connected with  Tanya Hogan on 07/29/21 at  2:20 PM EST by telephone and verified that I am speaking with the correct person using two identifiers.   I discussed the limitations, risks, security and privacy concerns of performing an evaluation and management service by telephone and the availability of in person appointments. I also discussed with the patient that there may be a patient responsible charge related to this service. The patient expressed understanding and agreed to proceed.  Participating parties included in this telephone visit include: The patient and the nurse practitioner listed.  The patient is: At home I am: In the office  Subjective:    CC: congestion  HPI: Tanya Hogan is a 70 y.o. year old female presenting today via telephone visit to discuss congestion.  Patient wanted to discuss her nasal congestion and purulent mucus. She saw ENT on 07/24/21 for nasal endoscopy for recurrent epistaxis. The debrided some scabbed areas and cauterized the sites; there was some low-grad purulence noted. She was started on doxycycline for sinus infection and told to follow-up with them as needed.  She reports that she has only had some scant bleeding since then, but has had a significant about of purulent mucous/drainage. She still has about 5 days left of doxycycline. No fevers, sinus pain, headaches, breathing trouble, wheezing, chest pain, nausea, vomiting, diarrhea.      Past medical history, Surgical history, Family history not pertinant except as noted below, Social history, Allergies, and medications have been entered into the medical record, reviewed, and corrections made.   Review of Systems:  All review of systems negative except what is listed in the HPI  Objective:    General:  Patient speaking clearly in complete sentences. No shortness of breath noted.   Alert and oriented x3.   Normal judgment.  No apparent acute  distress.  Impression and Recommendations:    1. Acute non-recurrent pansinusitis Recommend that she finish out the doxycycline she was given by ENT. She can also try to add Mucinex. Encouraged supportive measures including rest, hydration, humidifier use, steam showers, warm liquids with lemon and honey, and over-the-counter cough, cold, and analgesics as needed. If not improving by the end of the week, recommend she follow-up with ENT again. Patient aware of signs/symptoms requiring further/urgent evaluation.     I discussed the assessment and treatment plan with the patient. The patient was provided an opportunity to ask questions and all were answered. The patient agreed with the plan and demonstrated an understanding of the instructions.   The patient was advised to call back or seek an in-person evaluation if the symptoms worsen or if the condition fails to improve as anticipated.  I provided 20 minutes of non-face-to-face time during this TELEPHONE encounter.    Terrilyn Saver, NP

## 2021-07-31 ENCOUNTER — Other Ambulatory Visit: Payer: Self-pay

## 2021-07-31 DIAGNOSIS — R739 Hyperglycemia, unspecified: Secondary | ICD-10-CM

## 2021-07-31 MED ORDER — INSULIN PEN NEEDLE 31G X 8 MM MISC: 3 refills | Status: DC

## 2021-08-05 ENCOUNTER — Telehealth: Payer: Self-pay

## 2021-08-05 DIAGNOSIS — G4737 Central sleep apnea in conditions classified elsewhere: Secondary | ICD-10-CM | POA: Diagnosis not present

## 2021-08-05 DIAGNOSIS — K0510 Chronic gingivitis, plaque induced: Secondary | ICD-10-CM

## 2021-08-05 DIAGNOSIS — G4733 Obstructive sleep apnea (adult) (pediatric): Secondary | ICD-10-CM | POA: Diagnosis not present

## 2021-08-05 MED ORDER — VALACYCLOVIR HCL 1 G PO TABS: 1000.0000 mg | ORAL_TABLET | Freq: Two times a day (BID) | ORAL | 2 refills | Status: DC

## 2021-08-05 NOTE — Telephone Encounter (Signed)
See if she is called the pharmacy.  It looks like we wrote the prescription in January and it did have 2 refills on and I just want a make sure that she really needs a new one sent.

## 2021-08-05 NOTE — Telephone Encounter (Signed)
No prescription on file. Sent in a new prescription. Left message advising patient.

## 2021-08-05 NOTE — Telephone Encounter (Signed)
Tanya Hogan called and left a message stating she has an ulcer on her gums. She would like an antiviral medication, as for cold sores. Please advise.

## 2021-08-06 DIAGNOSIS — L84 Corns and callosities: Secondary | ICD-10-CM | POA: Diagnosis not present

## 2021-08-06 DIAGNOSIS — L602 Onychogryphosis: Secondary | ICD-10-CM | POA: Diagnosis not present

## 2021-08-06 DIAGNOSIS — M79672 Pain in left foot: Secondary | ICD-10-CM | POA: Diagnosis not present

## 2021-08-06 DIAGNOSIS — E119 Type 2 diabetes mellitus without complications: Secondary | ICD-10-CM | POA: Diagnosis not present

## 2021-08-06 DIAGNOSIS — M79671 Pain in right foot: Secondary | ICD-10-CM | POA: Diagnosis not present

## 2021-08-07 DIAGNOSIS — R040 Epistaxis: Secondary | ICD-10-CM | POA: Diagnosis not present

## 2021-08-07 DIAGNOSIS — E118 Type 2 diabetes mellitus with unspecified complications: Secondary | ICD-10-CM | POA: Diagnosis not present

## 2021-08-07 DIAGNOSIS — I5022 Chronic systolic (congestive) heart failure: Secondary | ICD-10-CM | POA: Diagnosis not present

## 2021-08-07 DIAGNOSIS — G4733 Obstructive sleep apnea (adult) (pediatric): Secondary | ICD-10-CM | POA: Diagnosis not present

## 2021-08-07 DIAGNOSIS — I4729 Other ventricular tachycardia: Secondary | ICD-10-CM | POA: Diagnosis not present

## 2021-08-11 ENCOUNTER — Ambulatory Visit (INDEPENDENT_AMBULATORY_CARE_PROVIDER_SITE_OTHER): Payer: Medicare Other | Admitting: Pharmacist

## 2021-08-11 ENCOUNTER — Other Ambulatory Visit: Payer: Self-pay

## 2021-08-11 DIAGNOSIS — I10 Essential (primary) hypertension: Secondary | ICD-10-CM

## 2021-08-11 DIAGNOSIS — E118 Type 2 diabetes mellitus with unspecified complications: Secondary | ICD-10-CM

## 2021-08-11 DIAGNOSIS — E785 Hyperlipidemia, unspecified: Secondary | ICD-10-CM

## 2021-08-11 NOTE — Progress Notes (Signed)
Chronic Care Management Pharmacy Note  08/11/2021 Name:  Tanya Hogan MRN:  [id number] DOB:  1950/11/14  Summary: addressed HTN, HLD, and primarily DM. She is thrilled with her CGM monitor, and states her readings as follows: - Average BG last 7 days= 125, last 14 days = 128, last 30 days = 130.  - 166 after just eating, BG 200 spikes only twice over past 2 weeks  Currently stopped insulin altogether, taking ozempic 0.56m weekly.   Recommendations/Changes made from today's visit:  Continue current regimen, she does not want to increase ozempic dosing just yet, which is reasonable since her sugars have stayed within range. Suspect this is related to dietary changes as she reports feeling much fuller with small meals.    Cost assistance for ozempic is pending, we are providing samples in meantime until company is caught up with shipment/status.  Plan: f/u with pharmacist in 1 month  Subjective: Tanya DIONISIOis an 70y.o. year old female who is a primary patient of [name], CRene Kocher MD.  The CCM team was consulted for assistance with disease management and care coordination needs.    Engaged with patient by telephone for follow up visit in response to provider referral for pharmacy case management and/or care coordination services.   Consent to Services:  The patient was given information about Chronic Care Management services, agreed to services, and gave verbal consent prior to initiation of services.  Please see initial visit note for detailed documentation.   Patient Care Team: MHali Marry MD as PCP - General (Family Medicine) KMarcy Panning MD (Hematology and Oncology) CLelon Perla MD as Attending Physician (Cardiology) TEvans Lance MD (Cardiology) Dr. LCaprice Kluver(Otolaryngology) FAmada Kingfisher MD as Consulting Physician (Hematology and Oncology) AGerre Pebbles MD as Referring Physician (Gastroenterology) FHaynes Dage OD as Referring  Physician (Optometry) KDarius Bump [facility] (Pharmacist)   Objective:  Lab Results  Component Value Date   CREATININE 0.94 04/16/2021   CREATININE 0.89 03/26/2020   CREATININE 0.82 10/20/2019    Lab Results  Component Value Date   HGBA1C 6.4 (A) 07/21/2021   Last diabetic Eye exam:  Lab Results  Component Value Date/Time   HMDIABEYEEXA No Retinopathy 06/11/2021 12:00 AM    Last diabetic Foot exam: No results found for: HMDIABFOOTEX      Component Value Date/Time   CHOL 168 04/16/2021 0000   TRIG 120 04/16/2021 0000   HDL 73 04/16/2021 0000   CHOLHDL 2.3 04/16/2021 0000   VLDL 26 02/18/2017 0939   LDLCALC 74 04/16/2021 0000    Hepatic Function Latest Ref Rng & Units 04/16/2021 03/26/2020 10/20/2019  Total Protein 6.1 - 8.1 g/dL 7.1 6.6 6.4  Albumin 3.5 - 5.0 g/dL - - -  AST 10 - 35 U/L _0 ALT 6 - 29 U/L _1 Alk Phosphatase 38 - 126 U/L - - -  Total Bilirubin 0.2 - 1.2 mg/dL 0.4 0.4 0.3  Bilirubin, Direct 0.0 - 0.3 mg/dL - - -    Lab Results  Component Value Date/Time   TSH 5.96 (H) 07/03/2021 08:29 AM   TSH 3.26 06/02/2021 12:00 AM    CBC Latest Ref Rng & Units 04/16/2021 03/14/2019 12/28/2018  WBC 3.8 - 10.8 Thousand/uL 5.4 6.5 7.8  Hemoglobin 11.7 - 15.5 g/dL 12.2 14.1 15.0  Hematocrit 35.0 - 45.0 % 38.0 42.7 45.2(H)  Platelets 140 - 400 Thousand/uL 299 287 357  Lab Results  Component Value Date/Time   VD25OH 65 05/04/2014 02:27 PM   VD25OH 61 12/29/2010 08:05 AM    Clinical ASCVD: Yes  The 10-year ASCVD risk score (Arnett DK, et al., 2019) is: 26.2%   Values used to calculate the score:     Age: 70 years     Sex: Female     Is Non-Hispanic African American: No     Diabetic: Yes     Tobacco smoker: No     Systolic Blood Pressure: 185 mmHg     Is BP treated: Yes     HDL Cholesterol: 73 mg/dL     Total Cholesterol: 168 mg/dL      Social History   Tobacco Use  Smoking Status Former   Packs/day: 0.00   Years: 0.00    Pack years: 0.00   Types: Cigarettes   Quit date: 08/24/1966   Years since quitting: 55.0  Smokeless Tobacco Never   BP Readings from Last 3 Encounters:  07/29/21 111/68  07/21/21 126/66  04/16/21 (!) 132/57   Pulse Readings from Last 3 Encounters:  07/29/21 95  07/21/21 94  05/13/21 88   Wt Readings from Last 3 Encounters:  07/29/21 217 lb (98.4 kg)  07/21/21 217 lb (98.4 kg)  04/16/21 219 lb (99.3 kg)    Assessment: Review of patient past medical history, allergies, medications, health status, including review of consultants reports, laboratory and other test data, was performed as part of comprehensive evaluation and provision of chronic care management services.   SDOH:  (Social Determinants of Health) assessments and interventions performed:    CCM Care Plan  Allergies  Allergen Reactions   Ferric Carboxymaltose Shortness Of Breath    Mild SOB following injectafer infusion Mild SOB following injectafer infusion    Penicillins Anaphylaxis    Has patient had a PCN reaction causing immediate rash, facial/tongue/throat swelling, SOB or lightheadedness with hypotension: Yes Has patient had a PCN reaction causing severe rash involving mucus membranes or skin necrosis: No Has patient had a PCN reaction that required hospitalization: No Has patient had a PCN reaction occurring within the last 10 years: No If all of the above answers are "NO", then may proceed with Cephalosporin use.    Accupril [Quinapril Hcl] Other (See Comments)    Hyperkalemia   Atorvastatin Other (See Comments)    msucle aches   Celebrex [Celecoxib]     ulcers   Fluticasone Other (See Comments)    Nasal ulcer.     Levocetirizine     Doesn't recall   Levomenol Other (See Comments)    unknown unknown    Losartan Potassium Itching   Naproxen Swelling   Nasonex [Mometasone Furoate] Other (See Comments)    nosebleed   Chamomile Other (See Comments)    unknown   Vesicare [Solifenacin] Other (See  Comments)    "stopped me from urinating"   Aspirin Other (See Comments)    Pt states "burns holes in stomach", ulcers    Baking Soda-Fluoride [Sodium Fluoride] Itching   Clindamycin/Lincomycin Rash   Codeine Nausea And Vomiting   Furosemide Other (See Comments)    Nose bleed   Lactose    Lactose Intolerance (Gi) Other (See Comments)    Increased calcium, sneezing, nasal issues   Lanolin-Petrolatum Rash   Metformin And Related Diarrhea   Miralax [Polyethylene Glycol 3350]    Miralax [Polyethylene Glycol] Rash   Nasonex [Mometasone Furoate] Other (See Comments)   Quinine Rash   Rifampin Other (  See Comments)    DOES NOT REMEMBER THIS MEDICATION OR ALLERGY   Sulfa Antibiotics Rash   Sulfonamide Derivatives Rash   Tramadol Rash    Anxiety   Wellbutrin [Bupropion] Other (See Comments)    Muscle aches, pain, "made my skin crawl"   Wool Alcohol [Lanolin] Rash   Zoledronic Acid Other (See Comments) and Rash    shaking    Medications Reviewed Today     Reviewed by Terrilyn Saver, NP (Nurse Practitioner) on 07/29/21 at 1442  Med List Status: <None>   Medication Order Taking? Sig Documenting Provider Last Dose Status Informant  albuterol (PROVENTIL) (2.5 MG/3ML) 0.083% nebulizer solution [id number] Yes Take 2.5 mg by nebulization every 6 (six) hours as needed for wheezing or shortness of breath. [provider] Taking Active   albuterol (VENTOLIN HFA) 108 (90 Base) MCG/ACT inhaler [id number] Yes Inhale 1-2 puffs into the lungs every 6 (six) hours as needed for wheezing or shortness of breath. Hali Marry, MD Taking Active   alendronate (FOSAMAX) 70 MG tablet [id number] Yes TAKE 1 TABLET BY MOUTH  EVERY 7 DAYS WITH A FULL  GLASS OF WATER ON AN EMPTY  STOMACH Hali Marry, MD Taking Active   AMBULATORY NON [place] [id number] Yes Medication Name: Tubing and supplies for nebulizer. Fax - [phone] Hali Marry, MD Taking Active   amiodarone  (PACERONE) 200 MG tablet [id number] Yes TAKE ONE-HALF TABLET BY  MOUTH DAILY Evans Lance, MD Taking Active   B Complex-C (B-COMPLEX WITH VITAMIN C) tablet [id number] Yes Take 1 tablet by mouth daily. [provider] Taking Active   bumetanide (BUMEX) 1 MG tablet [id number] Yes TAKE 1 TABLET BY MOUTH  TWICE DAILY Hali Marry, MD Taking Active   Calcium Citrate-Vitamin D (CITRACAL + D PO) [id number] Yes Take 1 tablet by mouth daily. [provider] Taking Active   Continuous Blood Gluc Receiver (FREESTYLE LIBRE 2 READER) [place] [id number] Yes by Does not apply route. [provider] Taking Active   Continuous Blood Gluc Sensor (FREESTYLE LIBRE 2 SENSOR) [place] [id number] Yes by Does not apply route. [provider] Taking Active   dicyclomine (BENTYL) 20 MG tablet [id number] Yes Take by mouth. [provider] Taking Active   diltiazem (CARDIZEM CD) 180 MG 24 hr capsule [id number] Yes TAKE 1 CAPSULE BY MOUTH  DAILY Hali Marry, MD Taking Active   EPIPEN 2-PAK 0.3 MG/0.3ML SOAJ injection [id number] Yes INJECT 1 PEN INTO MUSCLE OF LATERAL THIGH FOR SEVERE ALLERGIC REACTION. CALL 911 AFTER USING THIS MEDICATION. Hali Marry, MD Taking Active   ferrous sulfate 324 MG TBEC [id number] Yes Take 324 mg by mouth. [provider] Taking Active            Med Note Rikki Spearing Jul 07, 2021  2:19 PM) Instructed to hold while doing iron infusions as of 07/07/21 review  fexofenadine-pseudoephedrine (ALLEGRA-D 24) 180-240 MG 24 hr tablet [id number] Yes Take 1 tablet by mouth daily. [provider] Taking Active Self  fluticasone-salmeterol (ADVAIR HFA) 570-17 MCG/ACT inhaler [id number]  Inhale 2 puffs into the lungs 2 (two) times daily. [provider]  Expired 02/25/21 2359   Insulin Pen Needle 31G X 8 MM MISC [id number] Yes To be used for injecting insulin daily at bedtime. Dx: E11.8 Hali Marry, MD Taking  Active   levothyroxine (SYNTHROID) 88 MCG tablet [id number] Yes Take 1 tablet (88 mcg total) by mouth daily. Hali Marry,  MD Taking Active   LINZESS 72 MCG capsule [id number] Yes Take 72 mcg by mouth 2 (two) times daily. [provider] Taking Active   Multiple Vitamins-Minerals (OCUVITE EYE + MULTI) TABS [id number] Yes Take 1 tablet by mouth daily. [provider] Taking Active Self  pantoprazole (PROTONIX) 40 MG tablet [id number] Yes Take 40 mg by mouth daily. [provider] Taking Active   pravastatin (PRAVACHOL) 40 MG tablet [id number] Yes TAKE 1 TABLET BY MOUTH AT  BEDTIME Hali Marry, MD Taking Active   Probiotic Product (PROBIOTIC BLEND PO) [id number] Yes Take 1 tablet by mouth daily. [provider] Taking Active   Semaglutide,0.25 or 0.5MG/DOS, (OZEMPIC, 0.25 OR 0.5 MG/DOSE,) 2 MG/1.5ML SOPN [id number] Yes Inject 0.25 mg into the skin. [provider] Taking Active   Tiotropium Bromide Monohydrate 2.5 MCG/ACT AERS [id number]  Inhale 2 puffs into the lungs daily. [provider]  Expired 02/25/21 2359             Patient Active Problem List   Diagnosis Date Noted   Osteoporosis 02/28/2021   Primary osteoarthritis of left knee 01/14/2021   Irritable bowel syndrome with constipation 09/16/2020   Aortic atherosclerosis ([place]) 06/11/2020   Benign lipomatous neoplasm of skin and subcutaneous tissue of left leg 06/11/2020   Paroxysmal VT 02/14/2020   Chronic constipation 10/06/2019   Moderate persistent asthma with exacerbation 08/29/2019   Genetic testing 08/14/2019   Ductal carcinoma in situ (DCIS) of right breast 06/15/2019   Arthralgia 04/11/2019   Malignant tumor of breast ([place]) 03/06/2019   Fatigue 03/06/2019   Status post total shoulder arthroplasty, right 03/10/2018   DDD (degenerative disc disease), cervical 11/19/2017   Bradycardia with less than 60 beats per minute 11/02/2017   Peripheral edema 05/19/2017    Microcytic anemia 05/14/2017   Delayed sleep phase syndrome 10/29/2016   Primary osteoarthritis of left ankle 09/24/2016   Gastrointestinal food sensitivity 05/21/2016   History of arthroplasty of right shoulder 02/24/2016   Bilateral foot pain 01/22/2016   NASH (nonalcoholic steatohepatitis) 05/26/2015    Class: Stage 3   Nasal septal perforation 07/11/2014   Controlled diabetes mellitus type 2 with complications ([place]) 93/57/0177   Physical deconditioning 04/24/2014   Obesity, Class II, BMI 35-39.9, with comorbidity 06/09/2013   OSA (obstructive sleep apnea) 93/90/3009   Chronic systolic heart failure ([place]) 05/23/2013   Asthma, moderate persistent 04/18/2013   Chronic cough 04/18/2013   Breast cancer of upper-inner quadrant of left female breast ([place]) 03/18/2010   Hyperlipidemia 11/07/2009   Fibromyalgia 10/29/2009   Complete heart block ([place]) s/p MDT BiVP 10/23/2009   PACEMAKER, PERMANENT 10/23/2009   ALKALINE PHOSPHATASE, ELEVATED 10/13/2009   Hypothyroidism 10/03/2009   Benign essential hypertension 07/02/2009   Chronic back pain 07/02/2009    Immunization History  Administered Date(s) Administered   Fluad Quad(high Dose 65+) 04/11/2019, 05/02/2020, 04/24/2021   Influenza Split 05/10/2012, 05/04/2014   Influenza Whole 06/14/2009, 05/08/2010, 04/24/2011   Influenza, High Dose Seasonal PF 05/17/2017, 05/24/2018, 04/11/2019, 05/02/2020   Influenza,inj,Quad PF,6+ Mos 04/28/2013, 05/04/2014, 05/06/2015, 05/18/2016   Moderna Sars-Covid-2 Vaccination 11/01/2019, 11/29/2019, 07/19/2020   Pneumococcal Conjugate-13 05/25/2014   Pneumococcal Polysaccharide-23 11/28/2012, 12/28/2018   Td 08/24/2004, 03/07/2010   Tdap 07/14/2012   Zoster Recombinat (Shingrix) 10/06/2017, 01/16/2020   Zoster, Live 04/24/2011    Conditions to be addressed/monitored: HTN, HLD, and DMII  There are no care plans that you recently modified to display for this patient.     Medication Assistance:   Pending  Patient's preferred pharmacy is:  [id number] - [place], [place] - [address] AT [place] [address] [place] [address] Phone: [phone] Fax: [phone]  [facility] ([place], [place] [facility] [place] [address] [place] [address] Phone: [phone] Fax: [place] [phone] - [place], [place] [address] [place] [address] Phone: [phone] Fax: [phone]  [facility] ([facility] ) - [place], [address] [place] [address] Phone: [phone] Fax: [phone]   Uses pill box? Yes Pt endorses 100% compliance  Follow Up:  Patient agrees to Care Plan and Follow-up.  Plan: Telephone follow up appointment with care management team member scheduled for:  1 month  Larinda Buttery, PharmD Clinical Pharmacist [facility] [phone]

## 2021-08-12 DIAGNOSIS — I10 Essential (primary) hypertension: Secondary | ICD-10-CM | POA: Diagnosis not present

## 2021-08-12 DIAGNOSIS — H04123 Dry eye syndrome of bilateral lacrimal glands: Secondary | ICD-10-CM | POA: Diagnosis not present

## 2021-08-12 DIAGNOSIS — M1990 Unspecified osteoarthritis, unspecified site: Secondary | ICD-10-CM | POA: Diagnosis not present

## 2021-08-12 DIAGNOSIS — H25811 Combined forms of age-related cataract, right eye: Secondary | ICD-10-CM | POA: Diagnosis not present

## 2021-08-12 DIAGNOSIS — J4540 Moderate persistent asthma, uncomplicated: Secondary | ICD-10-CM | POA: Diagnosis not present

## 2021-08-12 DIAGNOSIS — K219 Gastro-esophageal reflux disease without esophagitis: Secondary | ICD-10-CM | POA: Diagnosis not present

## 2021-08-12 DIAGNOSIS — K581 Irritable bowel syndrome with constipation: Secondary | ICD-10-CM | POA: Diagnosis not present

## 2021-08-12 DIAGNOSIS — E039 Hypothyroidism, unspecified: Secondary | ICD-10-CM | POA: Diagnosis not present

## 2021-08-12 DIAGNOSIS — H25813 Combined forms of age-related cataract, bilateral: Secondary | ICD-10-CM | POA: Diagnosis not present

## 2021-08-12 DIAGNOSIS — G4733 Obstructive sleep apnea (adult) (pediatric): Secondary | ICD-10-CM | POA: Diagnosis not present

## 2021-08-12 DIAGNOSIS — E1136 Type 2 diabetes mellitus with diabetic cataract: Secondary | ICD-10-CM | POA: Diagnosis not present

## 2021-08-12 DIAGNOSIS — H52223 Regular astigmatism, bilateral: Secondary | ICD-10-CM | POA: Diagnosis not present

## 2021-08-12 DIAGNOSIS — K7581 Nonalcoholic steatohepatitis (NASH): Secondary | ICD-10-CM | POA: Diagnosis not present

## 2021-08-12 DIAGNOSIS — I442 Atrioventricular block, complete: Secondary | ICD-10-CM | POA: Diagnosis not present

## 2021-08-12 NOTE — Patient Instructions (Signed)
Visit Information  Thank you for taking time to visit with me today. Please don't hesitate to contact me if I can be of assistance to you before our next scheduled telephone appointment.  Following are the goals we discussed today:   Patient Goals/Self-Care Activities Over the next 30 days, patient will:  take medications as prescribed  Follow Up Plan: Telephone follow up appointment with care management team member scheduled for: 1 month  Please call the care guide team at [phone] if you need to cancel or reschedule your appointment.   The patient verbalized understanding of instructions, educational materials, and care plan provided today and agreed to receive a mailed copy of patient instructions, educational materials, and care plan.   Darius Bump

## 2021-08-14 ENCOUNTER — Telehealth: Payer: Self-pay | Admitting: Pharmacist

## 2021-08-14 DIAGNOSIS — E038 Other specified hypothyroidism: Secondary | ICD-10-CM | POA: Diagnosis not present

## 2021-08-14 NOTE — Progress Notes (Signed)
[employer] about Patient assistance application on Ozempic. The automated system stated that the application of the patient was not processed due to missing information on the application, which was patients name and address information. The application will need to be mailed again or re-faxed to them with the fixed missing information. Fax number is [phone].

## 2021-08-15 LAB — TSH: TSH: 2.54 mIU/L (ref 0.40–4.50)

## 2021-08-15 NOTE — Progress Notes (Signed)
Thyroid looks great.

## 2021-08-19 DIAGNOSIS — H04123 Dry eye syndrome of bilateral lacrimal glands: Secondary | ICD-10-CM | POA: Diagnosis not present

## 2021-08-19 DIAGNOSIS — Z95810 Presence of automatic (implantable) cardiac defibrillator: Secondary | ICD-10-CM | POA: Diagnosis not present

## 2021-08-19 DIAGNOSIS — G4733 Obstructive sleep apnea (adult) (pediatric): Secondary | ICD-10-CM | POA: Diagnosis not present

## 2021-08-19 DIAGNOSIS — I272 Pulmonary hypertension, unspecified: Secondary | ICD-10-CM | POA: Diagnosis not present

## 2021-08-19 DIAGNOSIS — H52223 Regular astigmatism, bilateral: Secondary | ICD-10-CM | POA: Diagnosis not present

## 2021-08-19 DIAGNOSIS — J449 Chronic obstructive pulmonary disease, unspecified: Secondary | ICD-10-CM | POA: Diagnosis not present

## 2021-08-19 DIAGNOSIS — E1136 Type 2 diabetes mellitus with diabetic cataract: Secondary | ICD-10-CM | POA: Diagnosis not present

## 2021-08-19 DIAGNOSIS — H25812 Combined forms of age-related cataract, left eye: Secondary | ICD-10-CM | POA: Diagnosis not present

## 2021-08-19 DIAGNOSIS — E785 Hyperlipidemia, unspecified: Secondary | ICD-10-CM | POA: Diagnosis not present

## 2021-08-19 DIAGNOSIS — E039 Hypothyroidism, unspecified: Secondary | ICD-10-CM | POA: Diagnosis not present

## 2021-08-19 DIAGNOSIS — Z961 Presence of intraocular lens: Secondary | ICD-10-CM | POA: Diagnosis not present

## 2021-08-19 DIAGNOSIS — I10 Essential (primary) hypertension: Secondary | ICD-10-CM | POA: Diagnosis not present

## 2021-08-19 DIAGNOSIS — J4541 Moderate persistent asthma with (acute) exacerbation: Secondary | ICD-10-CM | POA: Diagnosis not present

## 2021-08-19 DIAGNOSIS — H53003 Unspecified amblyopia, bilateral: Secondary | ICD-10-CM | POA: Diagnosis not present

## 2021-08-19 DIAGNOSIS — K7581 Nonalcoholic steatohepatitis (NASH): Secondary | ICD-10-CM | POA: Diagnosis not present

## 2021-08-19 DIAGNOSIS — Z853 Personal history of malignant neoplasm of breast: Secondary | ICD-10-CM | POA: Diagnosis not present

## 2021-08-19 DIAGNOSIS — Z9841 Cataract extraction status, right eye: Secondary | ICD-10-CM | POA: Diagnosis not present

## 2021-08-20 ENCOUNTER — Other Ambulatory Visit: Payer: Self-pay | Admitting: Family Medicine

## 2021-08-23 DIAGNOSIS — E118 Type 2 diabetes mellitus with unspecified complications: Secondary | ICD-10-CM | POA: Diagnosis not present

## 2021-08-23 DIAGNOSIS — E1169 Type 2 diabetes mellitus with other specified complication: Secondary | ICD-10-CM

## 2021-08-23 DIAGNOSIS — I10 Essential (primary) hypertension: Secondary | ICD-10-CM

## 2021-08-23 DIAGNOSIS — E785 Hyperlipidemia, unspecified: Secondary | ICD-10-CM

## 2021-09-01 ENCOUNTER — Telehealth: Payer: Self-pay | Admitting: Family Medicine

## 2021-09-01 DIAGNOSIS — K5909 Other constipation: Secondary | ICD-10-CM | POA: Diagnosis not present

## 2021-09-01 DIAGNOSIS — K581 Irritable bowel syndrome with constipation: Secondary | ICD-10-CM | POA: Diagnosis not present

## 2021-09-01 NOTE — Telephone Encounter (Signed)
Patient left a voicemail requesting to speak with [facility] the pharmacist in reference to needing Ozempic. Please advise

## 2021-09-01 NOTE — Telephone Encounter (Signed)
Called patient re: ozempic for her and brother, as well as patient assistance.   Patient to pick up medication supplies and more paperwork for [id number] applications, this Wednesday.  Tanya Hogan

## 2021-09-03 ENCOUNTER — Other Ambulatory Visit: Payer: Self-pay

## 2021-09-03 ENCOUNTER — Ambulatory Visit (INDEPENDENT_AMBULATORY_CARE_PROVIDER_SITE_OTHER): Payer: Medicare Other | Admitting: Pharmacist

## 2021-09-03 DIAGNOSIS — I10 Essential (primary) hypertension: Secondary | ICD-10-CM

## 2021-09-03 DIAGNOSIS — E118 Type 2 diabetes mellitus with unspecified complications: Secondary | ICD-10-CM

## 2021-09-03 DIAGNOSIS — E785 Hyperlipidemia, unspecified: Secondary | ICD-10-CM

## 2021-09-03 DIAGNOSIS — E1169 Type 2 diabetes mellitus with other specified complication: Secondary | ICD-10-CM

## 2021-09-04 NOTE — Progress Notes (Signed)
Chronic Care Management Pharmacy Note  09/04/2021 Name:  Tanya Hogan MRN:  [id number] DOB:  06/12/1951  Summary: addressed HTN, HLD, and primarily DM. This encounter is for care coordination of patient assistance for 2023.   Previous visit: She is thrilled with her CGM monitor, and states her readings as follows: - Average BG last 7 days= 125, last 14 days = 128, last 30 days = 130.  Currently stopped insulin altogether, taking ozempic 0.82m weekly.   Recommendations/Changes made from today's visit:  Continue current regimen, she does not want to increase ozempic dosing just yet, which is reasonable since her sugars have stayed within range. Suspect this is related to dietary changes as she reports feeling much fuller with small meals.    Cost assistance for ozempic for 2023 is pending, we completed application on 16/26/94 awaiting provider signature & fax. We are providing samples in meantime until company is caught up with shipment/status.  Plan: f/u with pharmacist in 2 weeks  Subjective: Tanya ADAMIis an 71y.o. year old female who is a primary patient of [name], CRene Kocher MD.  The CCM team was consulted for assistance with disease management and care coordination needs.    Engaged with patient face to face for  coordination of care  in response to provider referral for pharmacy case management and/or care coordination services.   Consent to Services:  The patient was given information about Chronic Care Management services, agreed to services, and gave verbal consent prior to initiation of services.  Please see initial visit note for detailed documentation.   Patient Care Team: MHali Marry MD as PCP - General (Family Medicine) KMarcy Panning MD (Hematology and Oncology) CLelon Perla MD as Attending Physician (Cardiology) TEvans Lance MD (Cardiology) Dr. LCaprice Kluver(Otolaryngology) FAmada Kingfisher MD as Consulting Physician (Hematology and  Oncology) AGerre Pebbles MD as Referring Physician (Gastroenterology) FHaynes Dage OD as Referring Physician (Optometry) KDarius Bump RJoyce [facility] Center[facility] Pharmacist (Pharmacist)   Objective:  Lab Results  Component Value Date   CREATININE 0.94 04/16/2021   CREATININE 0.89 03/26/2020   CREATININE 0.82 10/20/2019    Lab Results  Component Value Date   HGBA1C 6.4 (A) 07/21/2021   Last diabetic Eye exam:  Lab Results  Component Value Date/Time   HMDIABEYEEXA No Retinopathy 06/11/2021 12:00 AM    Last diabetic Foot exam: No results found for: HMDIABFOOTEX      Component Value Date/Time   CHOL 168 04/16/2021 0000   TRIG 120 04/16/2021 0000   HDL 73 04/16/2021 0000   CHOLHDL 2.3 04/16/2021 0000   VLDL 26 02/18/2017 0939   LDLCALC 74 04/16/2021 0000    Hepatic Function Latest Ref Rng & Units 04/16/2021 03/26/2020 10/20/2019  Total Protein 6.1 - 8.1 g/dL 7.1 6.6 6.4  Albumin 3.5 - 5.0 g/dL - - -  AST 10 - 35 U/L 22 20 19   ALT 6 - 29 U/L 18 16 15   Alk Phosphatase 38 - 126 U/L - - -  Total Bilirubin 0.2 - 1.2 mg/dL 0.4 0.4 0.3  Bilirubin, Direct 0.0 - 0.3 mg/dL - - -    Lab Results  Component Value Date/Time   TSH 2.54 08/14/2021 12:00 AM   TSH 5.96 (H) 07/03/2021 08:29 AM    CBC Latest Ref Rng & Units 04/16/2021 03/14/2019 12/28/2018  WBC 3.8 - 10.8 Thousand/uL 5.4 6.5 7.8  Hemoglobin 11.7 - 15.5 g/dL 12.2 14.1 15.0  Hematocrit 35.0 - 45.0 %  38.0 42.7 45.2(H)  Platelets 140 - 400 Thousand/uL 299 287 357    Lab Results  Component Value Date/Time   VD25OH 65 05/04/2014 02:27 PM   VD25OH 61 12/29/2010 08:05 AM    Clinical ASCVD: Yes  The 10-year ASCVD risk score (Arnett DK, et al., 2019) is: 24.8%   Values used to calculate the score:     Age: 71 years     Sex: Female     Is Non-Hispanic African American: No     Diabetic: Yes     Tobacco smoker: No     Systolic Blood Pressure: 093 mmHg     Is BP treated: Yes     HDL Cholesterol: 73 mg/dL     Total Cholesterol: 168  mg/dL      Social History   Tobacco Use  Smoking Status Former   Packs/day: 0.00   Years: 0.00   Pack years: 0.00   Types: Cigarettes   Quit date: 08/24/1966   Years since quitting: 55.0  Smokeless Tobacco Never   BP Readings from Last 3 Encounters:  07/29/21 111/68  07/21/21 126/66  04/16/21 (!) 132/57   Pulse Readings from Last 3 Encounters:  07/29/21 95  07/21/21 94  05/13/21 88   Wt Readings from Last 3 Encounters:  07/29/21 217 lb (98.4 kg)  07/21/21 217 lb (98.4 kg)  04/16/21 219 lb (99.3 kg)    Assessment: Review of patient past medical history, allergies, medications, health status, including review of consultants reports, laboratory and other test data, was performed as part of comprehensive evaluation and provision of chronic care management services.   SDOH:  (Social Determinants of Health) assessments and interventions performed:    CCM Care Plan  Allergies  Allergen Reactions   Ferric Carboxymaltose Shortness Of Breath    Mild SOB following injectafer infusion Mild SOB following injectafer infusion    Penicillins Anaphylaxis    Has patient had a PCN reaction causing immediate rash, facial/tongue/throat swelling, SOB or lightheadedness with hypotension: Yes Has patient had a PCN reaction causing severe rash involving mucus membranes or skin necrosis: No Has patient had a PCN reaction that required hospitalization: No Has patient had a PCN reaction occurring within the last 10 years: No If all of the above answers are "NO", then may proceed with Cephalosporin use.    Accupril [Quinapril Hcl] Other (See Comments)    Hyperkalemia   Atorvastatin Other (See Comments)    msucle aches   Celebrex [Celecoxib]     ulcers   Fluticasone Other (See Comments)    Nasal ulcer.     Levocetirizine     Doesn't recall   Levomenol Other (See Comments)    unknown unknown    Losartan Potassium Itching   Naproxen Swelling   Nasonex [Mometasone Furoate] Other (See  Comments)    nosebleed   Chamomile Other (See Comments)    unknown   Vesicare [Solifenacin] Other (See Comments)    "stopped me from urinating"   Aspirin Other (See Comments)    Pt states "burns holes in stomach", ulcers    Baking Soda-Fluoride [Sodium Fluoride] Itching   Clindamycin/Lincomycin Rash   Codeine Nausea And Vomiting   Furosemide Other (See Comments)    Nose bleed   Lactose    Lactose Intolerance (Gi) Other (See Comments)    Increased calcium, sneezing, nasal issues   Lanolin-Petrolatum Rash   Metformin And Related Diarrhea   Miralax [Polyethylene Glycol 3350]    Miralax [Polyethylene Glycol] Rash  Nasonex [Mometasone Furoate] Other (See Comments)   Quinine Rash   Rifampin Other (See Comments)    DOES NOT REMEMBER THIS MEDICATION OR ALLERGY   Sulfa Antibiotics Rash   Sulfonamide Derivatives Rash   Tramadol Rash    Anxiety   Wellbutrin [Bupropion] Other (See Comments)    Muscle aches, pain, "made my skin crawl"   Wool Alcohol [Lanolin] Rash   Zoledronic Acid Other (See Comments) and Rash    shaking    Medications Reviewed Today     Reviewed by Darius Bump, [facility] (Pharmacist) on 09/04/21 at 1434  Med List Status: <None>   Medication Order Taking? Sig Documenting Provider Last Dose Status Informant  albuterol (PROVENTIL) (2.5 MG/3ML) 0.083% nebulizer solution [id number] No Take 2.5 mg by nebulization every 6 (six) hours as needed for wheezing or shortness of breath. [provider] Taking Active   albuterol (VENTOLIN HFA) 108 (90 Base) MCG/ACT inhaler [id number] No Inhale 1-2 puffs into the lungs every 6 (six) hours as needed for wheezing or shortness of breath. Hali Marry, MD Taking Active   alendronate (FOSAMAX) 70 MG tablet [id number] No TAKE 1 TABLET BY MOUTH  EVERY 7 DAYS WITH A FULL  GLASS OF WATER ON AN EMPTY  STOMACH Hali Marry, MD Taking Active   AMBULATORY Baruch Gouty MEDICATION [id number] No Medication Name: Tubing and  supplies for nebulizer. Fax - [phone] Hali Marry, MD Taking Active   amiodarone (PACERONE) 200 MG tablet [id number] No TAKE ONE-HALF TABLET BY  MOUTH DAILY Evans Lance, MD Taking Active   B Complex-C (B-COMPLEX WITH VITAMIN C) tablet [id number] No Take 1 tablet by mouth daily. [provider] Taking Active   bumetanide (BUMEX) 1 MG tablet [id number]  TAKE 1 TABLET BY MOUTH  TWICE DAILY Hali Marry, MD  Active   Calcium Citrate-Vitamin D (CITRACAL + D PO) [id number] No Take 1 tablet by mouth daily. [provider] Taking Active   Continuous Blood Gluc Receiver (FREESTYLE LIBRE 2 READER) DEVI [id number] No by Does not apply route. [provider] Taking Active   Continuous Blood Gluc Sensor (FREESTYLE LIBRE 2 SENSOR) MISC [id number] No by Does not apply route. [provider] Taking Active   dicyclomine (BENTYL) 20 MG tablet [id number] No Take by mouth.  Patient not taking: Reported on 08/11/2021   [provider] Not Taking Active   diltiazem (CARDIZEM CD) 180 MG 24 hr capsule [id number]  TAKE 1 CAPSULE BY MOUTH  DAILY Hali Marry, MD  Active   EPIPEN 2-PAK 0.3 MG/0.3ML SOAJ injection [id number] No INJECT 1 PEN INTO MUSCLE OF LATERAL THIGH FOR SEVERE ALLERGIC REACTION. CALL 911 AFTER USING THIS MEDICATION. Hali Marry, MD Taking Active   ferrous sulfate 324 MG TBEC [id number] No Take 324 mg by mouth.  Patient not taking: Reported on 08/11/2021   [provider] Not Taking Active            Med Note Rikki Spearing Jul 07, 2021  2:19 PM) Instructed to hold while doing iron infusions as of 07/07/21 review  fexofenadine-pseudoephedrine (ALLEGRA-D 24) 180-240 MG 24 hr tablet [id number] No Take 1 tablet by mouth daily. [provider] Taking Active Self  fluticasone-salmeterol (ADVAIR HFA) 902-40 MCG/ACT inhaler [id number] No Inhale 2 puffs into the lungs 2 (two) times daily. [provider] Taking Expired 02/25/21 2359   Insulin Pen Needle 31G X 8 MM MISC [id number] No To be used for injecting  insulin daily at bedtime. Dx: E11.8 Hali Marry, MD Taking Active   levothyroxine (SYNTHROID) 88 MCG tablet [id number] No Take 1 tablet (88 mcg total) by mouth daily. Hali Marry, MD Taking Active   LINZESS 72 MCG capsule [id number] No Take 72 mcg by mouth 2 (two) times daily. [provider] Taking Active   Multiple Vitamins-Minerals (OCUVITE EYE + MULTI) TABS [id number] No Take 1 tablet by mouth daily. [provider] Taking Active Self  pantoprazole (PROTONIX) 40 MG tablet [id number] No Take 40 mg by mouth daily. [provider] Taking Active   pravastatin (PRAVACHOL) 40 MG tablet [id number] No TAKE 1 TABLET BY MOUTH AT  BEDTIME Hali Marry, MD Taking Active   Probiotic Product (PROBIOTIC BLEND PO) [id number] No Take 1 tablet by mouth daily. [provider] Taking Active   Semaglutide,0.25 or 0.5MG/DOS, (OZEMPIC, 0.25 OR 0.5 MG/DOSE,) 2 MG/1.5ML SOPN [id number] No Inject 0.25 mg into the skin. [provider] Taking Active            Med Note Rikki Spearing Aug 11, 2021  2:47 PM) Taking Saturdays  Tiotropium Bromide Monohydrate 2.5 MCG/ACT AERS [id number] No Inhale 2 puffs into the lungs daily. [provider] Taking Expired 02/25/21 2359   valACYclovir (VALTREX) 1000 MG tablet [id number] No Take 1 tablet (1,000 mg total) by mouth 2 (two) times daily. Hali Marry, MD Taking Active             Patient Active Problem List   Diagnosis Date Noted   Osteoporosis 02/28/2021   Primary osteoarthritis of left knee 01/14/2021   Irritable bowel syndrome with constipation 09/16/2020   Aortic atherosclerosis ([place]) 06/11/2020   Benign lipomatous neoplasm of skin and subcutaneous tissue of left leg 06/11/2020   Paroxysmal VT 02/14/2020   Chronic constipation 10/06/2019   Moderate persistent  asthma with exacerbation 08/29/2019   Genetic testing 08/14/2019   Ductal carcinoma in situ (DCIS) of right breast 06/15/2019   Arthralgia 04/11/2019   Malignant tumor of breast ([place]) 03/06/2019   Fatigue 03/06/2019   Status post total shoulder arthroplasty, right 03/10/2018   DDD (degenerative disc disease), cervical 11/19/2017   Bradycardia with less than 60 beats per minute 11/02/2017   Peripheral edema 05/19/2017   Microcytic anemia 05/14/2017   Delayed sleep phase syndrome 10/29/2016   Primary osteoarthritis of left ankle 09/24/2016   Gastrointestinal food sensitivity 05/21/2016   History of arthroplasty of right shoulder 02/24/2016   Bilateral foot pain 01/22/2016   NASH (nonalcoholic steatohepatitis) 05/26/2015    Class: Stage 3   Nasal septal perforation 07/11/2014   Controlled diabetes mellitus type 2 with complications ([place]) 16/38/4665   Physical deconditioning 04/24/2014   Obesity, Class II, BMI 35-39.9, with comorbidity 06/09/2013   OSA (obstructive sleep apnea) 99/35/7017   Chronic systolic heart failure ([place]) 05/23/2013   Asthma, moderate persistent 04/18/2013   Chronic cough 04/18/2013   Breast cancer of upper-inner quadrant of left female breast ([place]) 03/18/2010   Hyperlipidemia 11/07/2009   Fibromyalgia 10/29/2009   Complete heart block ([place]) s/p MDT BiVP 10/23/2009   PACEMAKER, PERMANENT 10/23/2009   ALKALINE PHOSPHATASE, ELEVATED 10/13/2009   Hypothyroidism 10/03/2009   Benign essential hypertension 07/02/2009   Chronic back pain 07/02/2009    Immunization History  Administered Date(s) Administered   Fluad Quad(high Dose 65+) 04/11/2019, 05/02/2020, 04/24/2021   Influenza Split 05/10/2012, 05/04/2014   Influenza Whole 06/14/2009, 05/08/2010, 04/24/2011   Influenza, High Dose Seasonal PF 05/17/2017, 05/24/2018,  04/11/2019, 05/02/2020   Influenza,inj,Quad PF,6+ Mos 04/28/2013, 05/04/2014, 05/06/2015, 05/18/2016   Moderna Sars-Covid-2 Vaccination  11/01/2019, 11/29/2019, 07/19/2020   Pneumococcal Conjugate-13 05/25/2014   Pneumococcal Polysaccharide-23 11/28/2012, 12/28/2018   Td 08/24/2004, 03/07/2010   Tdap 07/14/2012   Zoster Recombinat (Shingrix) 10/06/2017, 01/16/2020   Zoster, Live 04/24/2011    Conditions to be addressed/monitored: HTN, HLD, and DMII  Care Plan : Medication Management  Updates made by Darius Bump, [place] since 09/04/2021 12:00 AM     Problem: DM, HTN, HLD      Long-Range Goal: Disease Progression Prevention   Start Date: 05/21/2021  Recent Progress: On track  Priority: High  Note:   Current Barriers:  Unable to independently afford treatment regimen Pt describes falling into donut hole after first 3 months of the year, then June or July into catastrophic coverage. Stressful process for her.  Pharmacist Clinical Goal(s):  Over the next 14 days, patient will verbalize ability to afford treatment regimen adhere to plan to optimize therapeutic regimen for diabetes as evidenced by report of adherence to recommended medication management changes through collaboration with PharmD and provider.   Interventions: 1:1 collaboration with Hali Marry, MD regarding development and update of comprehensive plan of care as evidenced by provider attestation and co-signature Inter-disciplinary care team collaboration (see longitudinal plan of care) Comprehensive medication review performed; medication list updated in electronic medical record  Diabetes:  Controlled; current treatment: ozempic 0.25 mg weekly, tolerating well and states she has lost 10-15lbs, and discontinued insulin!  Current glucose readings: per CGM, which she is loving: - Average BG last 7 days= 125, last 14 days = 128, last 30 days = 130.  - 116, 102, 108, 169, 152, 125, 111 - 166 after just eating, BG 200 spikes only twice over past 2 weeks  Denies hypoglycemic/hyperglycemic symptoms  Current meal patterns: to be discussed at  future visits  Current exercise: to be discussed at future visits  Counseled on goal BG values, answered pt questions about CGM vs traditional glucometer Recommend patient continue ozempic 0.37m weekly using sample, novonordisk application for cost assistance in progress for ozempic  Hypertension:   Controlled; current treatment:diltiazem 1835mdaily, ;   Current home readings: states is "controlled" at home  Denies hypotensive/hypertensive symptoms  Recommended continue current regimen, and  Hyperlipidemia:  Controlled; current treatment:pravastatin 4074maily;   Recommended continue current regimen  Patient Goals/Self-Care Activities Over the next 14 days, patient will:  take medications as prescribed  Follow Up Plan: Telephone follow up appointment with care management team member scheduled for: 2 weeks       Medication Assistance:  Pending  [facility]  [id number] [address]: [phone]: [phone] (OptBark RanchA B[facility]erkThe [facility] [place]one: [phone]: [phone] OppenheimARMACY [id number] [place] [id number]: [phone]: [phone] Surgery [facility] I[facility] LLClivery ([facility] ) - [address] [place]one: [phone]: [phone] pill box? Yes Pt endorses 100% compliance  Follow Up:  Patient agrees to Care Plan and Follow-up.  Plan: Telephone follow  up appointment with care management team member scheduled for:  2 weeks  Larinda Buttery, PharmD Clinical Pharmacist [facility] [phone]

## 2021-09-04 NOTE — Patient Instructions (Signed)
Visit Information  Thank you for taking time to visit with me today. Please don't hesitate to contact me if I can be of assistance to you before our next scheduled telephone appointment.  Following are the goals we discussed today:   Patient Goals/Self-Care Activities Over the next 14 days, patient will:  take medications as prescribed  Follow Up Plan: Telephone follow up appointment with care management team member scheduled for: 2 weeks  Please call the care guide team at [phone] if you need to cancel or reschedule your appointment.   Patient verbalizes understanding of instructions and care plan provided today and agrees to view in [place]. Active MyChart status confirmed with patient.    Tanya Hogan

## 2021-09-06 DIAGNOSIS — E860 Dehydration: Secondary | ICD-10-CM | POA: Diagnosis not present

## 2021-09-06 DIAGNOSIS — R053 Chronic cough: Secondary | ICD-10-CM | POA: Diagnosis not present

## 2021-09-06 DIAGNOSIS — I700 Atherosclerosis of aorta: Secondary | ICD-10-CM | POA: Diagnosis not present

## 2021-09-06 DIAGNOSIS — I517 Cardiomegaly: Secondary | ICD-10-CM | POA: Diagnosis not present

## 2021-09-06 DIAGNOSIS — K5790 Diverticulosis of intestine, part unspecified, without perforation or abscess without bleeding: Secondary | ICD-10-CM | POA: Diagnosis not present

## 2021-09-06 DIAGNOSIS — R531 Weakness: Secondary | ICD-10-CM | POA: Diagnosis not present

## 2021-09-06 DIAGNOSIS — K5900 Constipation, unspecified: Secondary | ICD-10-CM | POA: Diagnosis not present

## 2021-09-06 DIAGNOSIS — R110 Nausea: Secondary | ICD-10-CM | POA: Diagnosis not present

## 2021-09-06 DIAGNOSIS — R0789 Other chest pain: Secondary | ICD-10-CM | POA: Diagnosis not present

## 2021-09-06 DIAGNOSIS — R778 Other specified abnormalities of plasma proteins: Secondary | ICD-10-CM | POA: Diagnosis not present

## 2021-09-06 DIAGNOSIS — Z87891 Personal history of nicotine dependence: Secondary | ICD-10-CM | POA: Diagnosis not present

## 2021-09-06 DIAGNOSIS — K8020 Calculus of gallbladder without cholecystitis without obstruction: Secondary | ICD-10-CM | POA: Diagnosis not present

## 2021-09-06 DIAGNOSIS — K8080 Other cholelithiasis without obstruction: Secondary | ICD-10-CM | POA: Diagnosis not present

## 2021-09-06 DIAGNOSIS — M546 Pain in thoracic spine: Secondary | ICD-10-CM | POA: Diagnosis not present

## 2021-09-06 DIAGNOSIS — M549 Dorsalgia, unspecified: Secondary | ICD-10-CM | POA: Diagnosis not present

## 2021-09-06 DIAGNOSIS — R1011 Right upper quadrant pain: Secondary | ICD-10-CM | POA: Diagnosis not present

## 2021-09-08 DIAGNOSIS — Z950 Presence of cardiac pacemaker: Secondary | ICD-10-CM | POA: Diagnosis not present

## 2021-09-08 DIAGNOSIS — R1011 Right upper quadrant pain: Secondary | ICD-10-CM | POA: Diagnosis not present

## 2021-09-08 DIAGNOSIS — E860 Dehydration: Secondary | ICD-10-CM | POA: Diagnosis not present

## 2021-09-08 DIAGNOSIS — R079 Chest pain, unspecified: Secondary | ICD-10-CM | POA: Diagnosis not present

## 2021-09-08 DIAGNOSIS — M546 Pain in thoracic spine: Secondary | ICD-10-CM | POA: Diagnosis not present

## 2021-09-11 ENCOUNTER — Other Ambulatory Visit: Payer: Self-pay | Admitting: Neurology

## 2021-09-11 DIAGNOSIS — T50905A Adverse effect of unspecified drugs, medicaments and biological substances, initial encounter: Secondary | ICD-10-CM

## 2021-09-11 DIAGNOSIS — R739 Hyperglycemia, unspecified: Secondary | ICD-10-CM

## 2021-09-11 MED ORDER — INSULIN PEN NEEDLE 31G X 8 MM MISC: 3 refills | Status: DC

## 2021-09-12 DIAGNOSIS — E118 Type 2 diabetes mellitus with unspecified complications: Secondary | ICD-10-CM | POA: Diagnosis not present

## 2021-09-15 ENCOUNTER — Other Ambulatory Visit: Payer: Self-pay

## 2021-09-15 ENCOUNTER — Ambulatory Visit: Payer: Medicare Other | Admitting: Pharmacist

## 2021-09-15 DIAGNOSIS — E118 Type 2 diabetes mellitus with unspecified complications: Secondary | ICD-10-CM

## 2021-09-15 DIAGNOSIS — I10 Essential (primary) hypertension: Secondary | ICD-10-CM

## 2021-09-15 DIAGNOSIS — E1169 Type 2 diabetes mellitus with other specified complication: Secondary | ICD-10-CM

## 2021-09-15 NOTE — Progress Notes (Signed)
Chronic Care Management Pharmacy Note  09/15/2021 Name:  Tanya Hogan MRN:  [id number] DOB:  06-27-1951  Summary: addressed HTN, HLD, and primarily DM. She had a recent ED visit for dehydration and vertigo.  BG per CGM as follows: - Average BG last 7 days= 128, last 14 days = 123, last 30 days = 124.  Today's BG values: 117, 77, 110, 88, 79.   Patient states she is a little dehydrated, no appetite, IBS was acting up, constipated.  Recommendations/Changes made from today's visit:  Continue current regimen.   Cost assistance for ozempic for 2023 has been faxed and is pending update from company.  Plan: f/u with pharmacist in 1 month  Subjective: Tanya Hogan is an 71 y.o. year old female who is a primary patient of [name], Rene Kocher, MD.  The CCM team was consulted for assistance with disease management and care coordination needs.    Engaged with patient by telephone for follow up visit in response to provider referral for pharmacy case management and/or care coordination services.   Consent to Services:  The patient was given information about Chronic Care Management services, agreed to services, and gave verbal consent prior to initiation of services.  Please see initial visit note for detailed documentation.   Patient Care Team: Hali Marry, MD as PCP - General (Family Medicine) Marcy Panning, MD (Hematology and Oncology) Lelon Perla, MD as Attending Physician (Cardiology) Evans Lance, MD (Cardiology) Dr. Caprice Kluver (Otolaryngology) Amada Kingfisher, MD as Consulting Physician (Hematology and Oncology) Gerre Pebbles, MD as Referring Physician (Gastroenterology) Haynes Dage, OD as Referring Physician (Optometry) Darius Bump, [facility] as Pharmacist (Pharmacist)   Objective:  Lab Results  Component Value Date   CREATININE 0.94 04/16/2021   CREATININE 0.89 03/26/2020   CREATININE 0.82 10/20/2019    Lab Results  Component Value Date    HGBA1C 6.4 (A) 07/21/2021   Last diabetic Eye exam:  Lab Results  Component Value Date/Time   HMDIABEYEEXA No Retinopathy 06/11/2021 12:00 AM    Last diabetic Foot exam: No results found for: HMDIABFOOTEX      Component Value Date/Time   CHOL 168 04/16/2021 0000   TRIG 120 04/16/2021 0000   HDL 73 04/16/2021 0000   CHOLHDL 2.3 04/16/2021 0000   VLDL 26 02/18/2017 0939   LDLCALC 74 04/16/2021 0000    Hepatic Function Latest Ref Rng & Units 04/16/2021 03/26/2020 10/20/2019  Total Protein 6.1 - 8.1 g/dL 7.1 6.6 6.4  Albumin 3.5 - 5.0 g/dL - - -  AST 10 - 35 U/L 22 20 19   ALT 6 - 29 U/L 18 16 15   Alk Phosphatase 38 - 126 U/L - - -  Total Bilirubin 0.2 - 1.2 mg/dL 0.4 0.4 0.3  Bilirubin, Direct 0.0 - 0.3 mg/dL - - -    Lab Results  Component Value Date/Time   TSH 2.54 08/14/2021 12:00 AM   TSH 5.96 (H) 07/03/2021 08:29 AM    CBC Latest Ref Rng & Units 04/16/2021 03/14/2019 12/28/2018  WBC 3.8 - 10.8 Thousand/uL 5.4 6.5 7.8  Hemoglobin 11.7 - 15.5 g/dL 12.2 14.1 15.0  Hematocrit 35.0 - 45.0 % 38.0 42.7 45.2(H)  Platelets 140 - 400 Thousand/uL 299 287 357    Lab Results  Component Value Date/Time   VD25OH 65 05/04/2014 02:27 PM   VD25OH 61 12/29/2010 08:05 AM    Clinical ASCVD: Yes  The 10-year ASCVD risk score (Arnett DK, et al., 2019) is:  20.4%   Values used to calculate the score:     Age: 66 years     Sex: Female     Is Non-Hispanic African American: No     Diabetic: Yes     Tobacco smoker: No     Systolic Blood Pressure: 419 mmHg     Is BP treated: Yes     HDL Cholesterol: 73 mg/dL     Total Cholesterol: 168 mg/dL      Social History   Tobacco Use  Smoking Status Former   Packs/day: 0.00   Years: 0.00   Pack years: 0.00   Types: Cigarettes   Quit date: 08/24/1966   Years since quitting: 55.0  Smokeless Tobacco Never   BP Readings from Last 3 Encounters:  07/29/21 111/68  07/21/21 126/66  04/16/21 (!) 132/57   Pulse Readings from Last 3 Encounters:   07/29/21 95  07/21/21 94  05/13/21 88   Wt Readings from Last 3 Encounters:  07/29/21 217 lb (98.4 kg)  07/21/21 217 lb (98.4 kg)  04/16/21 219 lb (99.3 kg)    Assessment: Review of patient past medical history, allergies, medications, health status, including review of consultants reports, laboratory and other test data, was performed as part of comprehensive evaluation and provision of chronic care management services.   SDOH:  (Social Determinants of Health) assessments and interventions performed:    CCM Care Plan  Allergies  Allergen Reactions   Ferric Carboxymaltose Shortness Of Breath    Mild SOB following injectafer infusion Mild SOB following injectafer infusion    Penicillins Anaphylaxis    Has patient had a PCN reaction causing immediate rash, facial/tongue/throat swelling, SOB or lightheadedness with hypotension: Yes Has patient had a PCN reaction causing severe rash involving mucus membranes or skin necrosis: No Has patient had a PCN reaction that required hospitalization: No Has patient had a PCN reaction occurring within the last 10 years: No If all of the above answers are "NO", then may proceed with Cephalosporin use.    Accupril [Quinapril Hcl] Other (See Comments)    Hyperkalemia   Atorvastatin Other (See Comments)    msucle aches   Celebrex [Celecoxib]     ulcers   Fluticasone Other (See Comments)    Nasal ulcer.     Levocetirizine     Doesn't recall   Levomenol Other (See Comments)    unknown unknown    Losartan Potassium Itching   Naproxen Swelling   Nasonex [Mometasone Furoate] Other (See Comments)    nosebleed   Chamomile Other (See Comments)    unknown   Vesicare [Solifenacin] Other (See Comments)    "stopped me from urinating"   Aspirin Other (See Comments)    Pt states "burns holes in stomach", ulcers    Baking Soda-Fluoride [Sodium Fluoride] Itching   Clindamycin/Lincomycin Rash   Codeine Nausea And Vomiting   Furosemide Other  (See Comments)    Nose bleed   Lactose    Lactose Intolerance (Gi) Other (See Comments)    Increased calcium, sneezing, nasal issues   Lanolin-Petrolatum Rash   Metformin And Related Diarrhea   Miralax [Polyethylene Glycol 3350]    Miralax [Polyethylene Glycol] Rash   Nasonex [Mometasone Furoate] Other (See Comments)   Quinine Rash   Rifampin Other (See Comments)    DOES NOT REMEMBER THIS MEDICATION OR ALLERGY   Sulfa Antibiotics Rash   Sulfonamide Derivatives Rash   Tramadol Rash    Anxiety   Wellbutrin [Bupropion] Other (See Comments)  Muscle aches, pain, "made my skin crawl"   Wool Alcohol [Lanolin] Rash   Zoledronic Acid Other (See Comments) and Rash    shaking    Medications Reviewed Today     Reviewed by Darius Bump, [facility] (Pharmacist) on 09/15/21 at 1455  Med List Status: <None>   Medication Order Taking? Sig Documenting Provider Last Dose Status Informant  albuterol (PROVENTIL) (2.5 MG/3ML) 0.083% nebulizer solution [id number] Yes Take 2.5 mg by nebulization every 6 (six) hours as needed for wheezing or shortness of breath. [provider] Taking Active   albuterol (VENTOLIN HFA) 108 (90 Base) MCG/ACT inhaler [id number] Yes Inhale 1-2 puffs into the lungs every 6 (six) hours as needed for wheezing or shortness of breath. Hali Marry, MD Taking Active   alendronate (FOSAMAX) 70 MG tablet [id number] Yes TAKE 1 TABLET BY MOUTH  EVERY 7 DAYS WITH A FULL  GLASS OF WATER ON AN EMPTY  STOMACH Hali Marry, MD Taking Active   AMBULATORY NON [place] [id number] Yes Medication Name: Tubing and supplies for nebulizer. Fax - [phone] Hali Marry, MD Taking Active   amiodarone (PACERONE) 200 MG tablet [id number] Yes TAKE ONE-HALF TABLET BY  MOUTH DAILY Evans Lance, MD Taking Active   B Complex-C (B-COMPLEX WITH VITAMIN C) tablet [id number] Yes Take 1 tablet by mouth daily. [provider] Taking Active   bumetanide  (BUMEX) 1 MG tablet [id number] Yes TAKE 1 TABLET BY MOUTH  TWICE DAILY Hali Marry, MD Taking Active   Calcium Citrate-Vitamin D (CITRACAL + D PO) [id number] Yes Take 1 tablet by mouth daily. [provider] Taking Active   Continuous Blood Gluc Receiver (FREESTYLE LIBRE 2 READER) [place] [id number] Yes by Does not apply route. [provider] Taking Active   Continuous Blood Gluc Sensor (FREESTYLE LIBRE 2 SENSOR) [place] [id number] Yes by Does not apply route. [provider] Taking Active   dicyclomine (BENTYL) 20 MG tablet [id number] No Take by mouth.  Patient not taking: Reported on 08/11/2021   [provider] Not Taking Active   diltiazem (CARDIZEM CD) 180 MG 24 hr capsule [id number] Yes TAKE 1 CAPSULE BY MOUTH  DAILY Hali Marry, MD Taking Active   EPIPEN 2-PAK 0.3 MG/0.3ML SOAJ injection [id number] Yes INJECT 1 PEN INTO MUSCLE OF LATERAL THIGH FOR SEVERE ALLERGIC REACTION. CALL 911 AFTER USING THIS MEDICATION. Hali Marry, MD Taking Active   ferrous sulfate 324 MG TBEC [id number]  Take 324 mg by mouth.  Patient not taking: Reported on 08/11/2021   [provider]  Active            Med Note Rikki Spearing Jul 07, 2021  2:19 PM) Instructed to hold while doing iron infusions as of 07/07/21 review  fexofenadine-pseudoephedrine (ALLEGRA-D 24) 180-240 MG 24 hr tablet [id number] Yes Take 1 tablet by mouth daily. [provider] Taking Active Self  fluticasone-salmeterol (ADVAIR HFA) 349-17 MCG/ACT inhaler [id number]  Inhale 2 puffs into the lungs 2 (two) times daily. [provider]  Expired 02/25/21 2359   Insulin Pen Needle 31G X 8 MM MISC [id number] Yes To be used for injecting insulin daily at bedtime. Dx: E11.8 Hali Marry, MD Taking Active   levothyroxine (SYNTHROID) 88 MCG tablet [id number] Yes Take 1 tablet (88 mcg total) by mouth daily. Hali Marry, MD Taking Active   LINZESS 72 MCG  capsule [id number] Yes Take 72 mcg by mouth 2 (two) times daily. [provider] Taking Active            Med Note Rikki Spearing Sep 15, 2021  2:54 PM) Instructed to take once daily per GI as of 09/15/21 review, patient may increase back to BID based on symptoms.  Multiple Vitamins-Minerals (OCUVITE EYE + MULTI) TABS [id number] Yes Take 1 tablet by mouth daily. [provider] Taking Active Self  pantoprazole (PROTONIX) 40 MG tablet [id number] Yes Take 40 mg by mouth daily. [provider] Taking Active   pravastatin (PRAVACHOL) 40 MG tablet [id number] Yes TAKE 1 TABLET BY MOUTH AT  BEDTIME Hali Marry, MD Taking Active   Probiotic Product (PROBIOTIC BLEND PO) [id number] Yes Take 1 tablet by mouth daily. [provider] Taking Active   Semaglutide,0.25 or 0.5MG/DOS, (OZEMPIC, 0.25 OR 0.5 MG/DOSE,) 2 MG/1.5ML SOPN [id number] Yes Inject 0.25 mg into the skin. [provider] Taking Active            Med Note Rikki Spearing Aug 11, 2021  2:47 PM) Taking Saturdays  Tiotropium Bromide Monohydrate 2.5 MCG/ACT AERS [id number]  Inhale 2 puffs into the lungs daily. [provider]  Expired 02/25/21 2359   valACYclovir (VALTREX) 1000 MG tablet [id number] Yes Take 1 tablet (1,000 mg total) by mouth 2 (two) times daily. Hali Marry, MD Taking Active             Patient Active Problem List   Diagnosis Date Noted   Osteoporosis 02/28/2021   Primary osteoarthritis of left knee 01/14/2021   Irritable bowel syndrome with constipation 09/16/2020   Aortic atherosclerosis ([place]) 06/11/2020   Benign lipomatous neoplasm of skin and subcutaneous tissue of left leg 06/11/2020   Paroxysmal VT 02/14/2020   Chronic constipation 10/06/2019   Moderate persistent asthma with exacerbation 08/29/2019   Genetic testing 08/14/2019   Ductal carcinoma in situ (DCIS) of right breast 06/15/2019   Arthralgia 04/11/2019   Malignant tumor of  breast ([place]) 03/06/2019   Fatigue 03/06/2019   Status post total shoulder arthroplasty, right 03/10/2018   DDD (degenerative disc disease), cervical 11/19/2017   Bradycardia with less than 60 beats per minute 11/02/2017   Peripheral edema 05/19/2017   Microcytic anemia 05/14/2017   Delayed sleep phase syndrome 10/29/2016   Primary osteoarthritis of left ankle 09/24/2016   Gastrointestinal food sensitivity 05/21/2016   History of arthroplasty of right shoulder 02/24/2016   Bilateral foot pain 01/22/2016   NASH (nonalcoholic steatohepatitis) 05/26/2015    Class: Stage 3   Nasal septal perforation 07/11/2014   Controlled diabetes mellitus type 2 with complications ([place]) 97/67/3419   Physical deconditioning 04/24/2014   Obesity, Class II, BMI 35-39.9, with comorbidity 06/09/2013   OSA (obstructive sleep apnea) 37/90/2409   Chronic systolic heart failure ([place]) 05/23/2013   Asthma, moderate persistent 04/18/2013   Chronic cough 04/18/2013   Breast cancer of upper-inner quadrant of left female breast ([place]) 03/18/2010   Hyperlipidemia 11/07/2009   Fibromyalgia 10/29/2009   Complete heart block ([place]) s/p MDT BiVP 10/23/2009   PACEMAKER, PERMANENT 10/23/2009   ALKALINE PHOSPHATASE, ELEVATED 10/13/2009   Hypothyroidism 10/03/2009   Benign essential hypertension 07/02/2009   Chronic back pain 07/02/2009    Immunization History  Administered Date(s) Administered   Fluad Quad(high Dose 65+) 04/11/2019, 05/02/2020, 04/24/2021   Influenza Split 05/10/2012, 05/04/2014   Influenza Whole 06/14/2009, 05/08/2010, 04/24/2011   Influenza, High Dose Seasonal PF 05/17/2017, 05/24/2018, 04/11/2019, 05/02/2020   Influenza,inj,Quad PF,6+ Mos 04/28/2013, 05/04/2014, 05/06/2015, 05/18/2016  Moderna Sars-Covid-2 Vaccination 11/01/2019, 11/29/2019, 07/19/2020   Pneumococcal Conjugate-13 05/25/2014   Pneumococcal Polysaccharide-23 11/28/2012, 12/28/2018   Td 08/24/2004, 03/07/2010   Tdap 07/14/2012    Zoster Recombinat (Shingrix) 10/06/2017, 01/16/2020   Zoster, Live 04/24/2011    Conditions to be addressed/monitored: HTN, HLD, and DMII  There are no care plans that you recently modified to display for this patient.      Medication Assistance:  Pending  Patient's preferred pharmacy is:  [id number] - [place], [place] - [address] AT [place] [address] [place] [address] Phone: [phone] Fax: [phone]  [facility] ([place], [place] [facility] [address] [address] Phone: [phone] Fax: [facility] [phone] - [place], [place] [address] [place] [address] Phone: [phone] Fax: [phone]  [facility] ([facility] ) - [place], [place] [address] Phone: [phone] Fax: [phone]   Uses pill box? Yes Pt endorses 100% compliance  Follow Up:  Patient agrees to Care Plan and Follow-up.  Plan: Telephone follow up appointment with care management team member scheduled for:  1 month  Larinda Buttery, PharmD Clinical Pharmacist [facility] [phone]

## 2021-09-15 NOTE — Patient Instructions (Signed)
Visit Information  Thank you for taking time to visit with me today. Please don't hesitate to contact me if I can be of assistance to you before our next scheduled telephone appointment.  Following are the goals we discussed today:  Patient Goals/Self-Care Activities Over the next 30 days, patient will:  take medications as prescribed  Follow Up Plan: Telephone follow up appointment with care management team member scheduled for: 1 month  Please call the care guide team at [phone] if you need to cancel or reschedule your appointment.    Patient verbalizes understanding of instructions and care plan provided today and agrees to view in [place]. Active MyChart status confirmed with patient.    Darius Bump

## 2021-09-16 ENCOUNTER — Ambulatory Visit (INDEPENDENT_AMBULATORY_CARE_PROVIDER_SITE_OTHER): Payer: Medicare Other

## 2021-09-16 ENCOUNTER — Encounter: Payer: Self-pay | Admitting: Family Medicine

## 2021-09-16 ENCOUNTER — Ambulatory Visit (INDEPENDENT_AMBULATORY_CARE_PROVIDER_SITE_OTHER): Payer: Medicare Other | Admitting: Family Medicine

## 2021-09-16 VITALS — BP 134/71 | HR 82 | Resp 18 | Ht 61.0 in | Wt 206.0 lb

## 2021-09-16 DIAGNOSIS — K5900 Constipation, unspecified: Secondary | ICD-10-CM | POA: Diagnosis not present

## 2021-09-16 DIAGNOSIS — E860 Dehydration: Secondary | ICD-10-CM | POA: Diagnosis not present

## 2021-09-16 DIAGNOSIS — M2578 Osteophyte, vertebrae: Secondary | ICD-10-CM | POA: Diagnosis not present

## 2021-09-16 DIAGNOSIS — M546 Pain in thoracic spine: Secondary | ICD-10-CM | POA: Diagnosis not present

## 2021-09-16 DIAGNOSIS — I5022 Chronic systolic (congestive) heart failure: Secondary | ICD-10-CM

## 2021-09-16 DIAGNOSIS — M549 Dorsalgia, unspecified: Secondary | ICD-10-CM

## 2021-09-16 LAB — POCT URINALYSIS DIP (CLINITEK)
Bilirubin, UA: NEGATIVE
Blood, UA: NEGATIVE
Glucose, UA: NEGATIVE mg/dL
Ketones, POC UA: NEGATIVE mg/dL
Leukocytes, UA: NEGATIVE
Nitrite, UA: NEGATIVE
POC PROTEIN,UA: NEGATIVE
Spec Grav, UA: 1.015 (ref 1.010–1.025)
Urobilinogen, UA: 0.2 E.U./dL
pH, UA: 7 (ref 5.0–8.0)

## 2021-09-16 LAB — BASIC METABOLIC PANEL WITH GFR
BUN: 12 mg/dL (ref 7–25)
CO2: 34 mmol/L — ABNORMAL HIGH (ref 20–32)
Calcium: 10.2 mg/dL (ref 8.6–10.4)
Chloride: 102 mmol/L (ref 98–110)
Creat: 0.89 mg/dL (ref 0.60–1.00)
Glucose, Bld: 119 mg/dL — ABNORMAL HIGH (ref 65–99)
Potassium: 3.4 mmol/L — ABNORMAL LOW (ref 3.5–5.3)
Sodium: 143 mmol/L (ref 135–146)
eGFR: 69 mL/min/{1.73_m2} (ref >=60)

## 2021-09-16 NOTE — Assessment & Plan Note (Signed)
No sign of volume overload on exam today.

## 2021-09-16 NOTE — Patient Instructions (Signed)
Continue with your Linzess until you have a soft or loose bowel movement.

## 2021-09-16 NOTE — Progress Notes (Signed)
Established Patient Office Visit  Subjective:  Patient ID: Tanya Hogan, female    DOB: 1950-12-21  Age: 71 y.o. MRN: [id number]  CC:  Chief Complaint  Patient presents with   [facility] for dehydration. Patient complains of fatigue     HPI [name] presents for follow-up of recent emergency department visit on January 14.  She presented initially with back pain constipation nausea and weakness.  She was dehydrated.  She feels like she is doing better getting her water and she is been drinking about 8 ounces every 2-3 hours and urinating normally.  But still feels like she has a dry mouth.  She reports that she still having some upper back pain that is at the very top of her bra strap almost between her shoulder blades.  She denies any known injury or trauma she has not been using any heat or ice.  She also reports that she still struggling with constipation as below.  Have a CT of the chest and abdomen.  Like she is also been battling constipation she restarted her Linzess about 2 days ago and has also been taking some mag citrate but cannot seem to move her bowels.  Has been taking an over-the-counter multi B vitamin.  She says in fact when she got from from the hospital she was taking it every several hours for a couple of days.  Past Medical History:  Diagnosis Date   Alkaline phosphatase elevation    Asthma    Breast cancer ([place]) 2011   Carpal tunnel syndrome    Carpal tunnel syndrome    bilateral   CHF (congestive heart failure) (HCC)    Cirrhosis, non-alcoholic (HCC)    Closed fracture of unspecified part of radius (alone)    DDD (degenerative disc disease), lumbar    Depression    pt denies   Diabetes mellitus without complication ([place])    Enlarged heart    Fibromyalgia    GERD (gastroesophageal reflux disease)    History of kidney stones    HTN (hypertension)    Hypothyroidism    IBS (irritable bowel syndrome)    LBBB (left bundle branch  block)    Microcytic anemia 05/14/2017   Pneumonia    Presence of permanent cardiac pacemaker    PVD (peripheral vascular disease) ([place])    2019   Sleep apnea    uses cpap    Past Surgical History:  Procedure Laterality Date   BREAST LUMPECTOMY Left 2011   COLONOSCOPY     FOOT SURGERY     KNEE SURGERY     MASTECTOMY Right    05/2019   PACEMAKER GENERATOR CHANGE N/A 08/23/2014   Procedure: PACEMAKER GENERATOR CHANGE;  Surgeon: Evans Lance, MD;  Location[facility] C[facility] LAB;  Service: Cardiovascular;  Laterality: N/A;   PACEMAKER PLACEMENT  2009   [facility]   RECTAL SURGERY  1976   REDUCTION MAMMAPLASTY Left    05/2019   TOTAL SHOULDER ARTHROPLASTY Right 03/10/2018   TOTAL SHOULDER ARTHROPLASTY Right 03/10/2018   Procedure: RIGHT TOTAL SHOULDER ARTHROPLASTY;  Surgeon: [name], MD;  Location: [place];  Service: Orthopedics;  Laterality: Right;   TUBAL LIGATION      Family History  Problem Relation Age of Onset   Heart disease Mother    Melanoma Mother    Parkinson's disease Mother    Heart disease Father    Heart disease Brother    Heart failure Other  Breast cancer Other    Hypertension Other     Social History   Socioeconomic History   Marital status: Divorced    Spouse name: Not on file   Number of children: 2   Years of education: 16   Highest education level: Associate degree: academic program  Occupational History   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 Lives with and helps her brother. Likes Surveyor, minerals, Social worker and music. Loves animals.   Social Determinants of Health   Financial Resource Strain: Low Risk    Difficulty of Paying Living Expenses: Not hard at all  Food Insecurity: No Food Insecurity   Worried About Charity fundraiser in the Last Year: Never true   [place] in the Last Year: Never true  Transportation Needs: No Transportation Needs   Lack of Transportation (Medical): No   Lack of Transportation (Non-Medical): No  Physical Activity: Inactive   Days of Exercise per Week: 0 days   Minutes of Exercise per Session: 0 min  Stress: No Stress Concern Present   Feeling of Stress : Not at all  Social Connections: Socially Isolated   Frequency of Communication with Friends and Family: More than three times a week   Frequency of Social Gatherings with Friends and Family: Once a week   Attends Religious Services: Never   Marine scientist or Organizations: No   Attends Music therapist: Never   Marital Status: Divorced  Human resources officer Violence: Not At Risk   Fear of Current or Ex-Partner: No   Emotionally Abused: No   Physically Abused: No   Sexually Abused: No    Outpatient Medications Prior to Visit  Medication Sig Dispense Refill   albuterol (PROVENTIL) (2.5 MG/3ML) 0.083% nebulizer solution Take 2.5 mg by nebulization every 6 (six) hours as needed for wheezing or shortness of breath.     albuterol (VENTOLIN HFA) 108 (90 Base) MCG/ACT inhaler Inhale 1-2 puffs into the lungs every 6 (six) hours as needed for wheezing or shortness of breath. 54 g 3   alendronate (FOSAMAX) 70 MG tablet TAKE 1 TABLET BY MOUTH  EVERY 7 DAYS WITH A FULL  GLASS OF WATER ON AN EMPTY  STOMACH 12 tablet 4   AMBULATORY NON FORMULARY MEDICATION Medication Name: Tubing and supplies for nebulizer. Fax - [phone] 1 vial 0   amiodarone (PACERONE) 200 MG tablet TAKE ONE-HALF TABLET BY  MOUTH DAILY 45 tablet 2   B Complex-C (B-COMPLEX WITH VITAMIN C) tablet  Take 1 tablet by mouth daily.     bumetanide (BUMEX) 1 MG tablet TAKE 1 TABLET BY MOUTH  TWICE DAILY 180 tablet 0   Calcium Citrate-Vitamin D (CITRACAL + D PO) Take 1 tablet by mouth daily.     Continuous Blood Gluc Receiver (FREESTYLE LIBRE 2 READER) DEVI by Does not apply route.     Continuous Blood Gluc Sensor (FREESTYLE LIBRE  2 SENSOR) MISC by Does not apply route.     dicyclomine (BENTYL) 20 MG tablet Take by mouth.     diltiazem (CARDIZEM CD) 180 MG 24 hr capsule TAKE 1 CAPSULE BY MOUTH  DAILY 90 capsule 0   EPIPEN 2-PAK 0.3 MG/0.3ML SOAJ injection INJECT 1 PEN INTO MUSCLE OF LATERAL THIGH FOR SEVERE ALLERGIC REACTION. CALL 911 AFTER USING THIS MEDICATION. 1 each 2   ferrous sulfate 324 MG TBEC Take 324 mg by mouth.     Insulin Pen Needle 31G X 8 MM MISC To be used for injecting insulin daily at bedtime. Dx: E11.8 100 each 3   levothyroxine (SYNTHROID) 88 MCG tablet Take 1 tablet (88 mcg total) by mouth daily. 90 tablet 0   LINZESS 72 MCG capsule Take 72 mcg by mouth 2 (two) times daily.     Multiple Vitamins-Minerals (OCUVITE EYE + MULTI) TABS Take 1 tablet by mouth daily.     pantoprazole (PROTONIX) 40 MG tablet Take 40 mg by mouth daily.     pravastatin (PRAVACHOL) 40 MG tablet TAKE 1 TABLET BY MOUTH AT  BEDTIME 90 tablet 3   Probiotic Product (PROBIOTIC BLEND PO) Take 1 tablet by mouth daily.     Semaglutide,0.25 or 0.5MG/DOS, (OZEMPIC, 0.25 OR 0.5 MG/DOSE,) 2 MG/1.5ML SOPN Inject 0.25 mg into the skin.     valACYclovir (VALTREX) 1000 MG tablet Take 1 tablet (1,000 mg total) by mouth 2 (two) times daily. 14 tablet 2   fexofenadine-pseudoephedrine (ALLEGRA-D 24) 180-240 MG 24 hr tablet Take 1 tablet by mouth daily.     fluticasone-salmeterol (ADVAIR HFA) 115-21 MCG/ACT inhaler Inhale 2 puffs into the lungs 2 (two) times daily.     Tiotropium Bromide Monohydrate 2.5 MCG/ACT AERS Inhale 2 puffs into the lungs daily.     diltiazem (CARDIZEM) 120 MG tablet Take 120 mg by mouth daily.      No facility-administered medications prior to visit.    Allergies  Allergen Reactions   Ferric Carboxymaltose Shortness Of Breath    Mild SOB following injectafer infusion Mild SOB following injectafer infusion    Penicillins Anaphylaxis    Has patient had a PCN reaction causing immediate rash, facial/tongue/throat swelling, SOB or lightheadedness with hypotension: Yes Has patient had a PCN reaction causing severe rash involving mucus membranes or skin necrosis: No Has patient had a PCN reaction that required hospitalization: No Has patient had a PCN reaction occurring within the last 10 years: No If all of the above answers are "NO", then may proceed with Cephalosporin use.    Accupril [Quinapril Hcl] Other (See Comments)    Hyperkalemia   Atorvastatin Other (See Comments)    msucle aches   Celebrex [Celecoxib]     ulcers   Fluticasone Other (See Comments)    Nasal ulcer.     Levocetirizine     Doesn't recall   Levomenol Other (See Comments)    unknown unknown    Losartan Potassium Itching   Naproxen Swelling   Nasonex [Mometasone Furoate] Other (See Comments)    nosebleed   Chamomile Other (See Comments)    unknown   Vesicare [Solifenacin] Other (See Comments)    "stopped me from urinating"   Aspirin Other (See Comments)    Pt states "burns holes in stomach", ulcers    Baking Soda-Fluoride [Sodium Fluoride] Itching   Clindamycin/Lincomycin Rash   Codeine Nausea And Vomiting   Furosemide Other (See Comments)    Nose bleed   Lactose  Lactose Intolerance (Gi) Other (See Comments)    Increased calcium, sneezing, nasal issues   Lanolin-Petrolatum Rash   Metformin And Related Diarrhea   Miralax [Polyethylene Glycol 3350]    Miralax [Polyethylene Glycol] Rash   Nasonex [Mometasone Furoate] Other (See Comments)   Quinine Rash   Rifampin Other (See Comments)    DOES NOT REMEMBER THIS MEDICATION OR ALLERGY   Sulfa Antibiotics Rash   Sulfonamide Derivatives Rash    Tramadol Rash    Anxiety   Wellbutrin [Bupropion] Other (See Comments)    Muscle aches, pain, "made my skin crawl"   Wool Alcohol [Lanolin] Rash   Zoledronic Acid Other (See Comments) and Rash    shaking    ROS Review of Systems    Objective:    Physical Exam  BP 134/71    Pulse 82    Resp 18    Ht 5' 1"  (1.549 m)    Wt 206 lb (93.4 kg)    SpO2 98%    BMI 38.92 kg/m  Wt Readings from Last 3 Encounters:  09/16/21 206 lb (93.4 kg)  07/29/21 217 lb (98.4 kg)  07/21/21 217 lb (98.4 kg)     Health Maintenance Due  Topic Date Due   URINE MICROALBUMIN  09/16/2021    There are no preventive care reminders to display for this patient.  Lab Results  Component Value Date   TSH 2.54 08/14/2021   Lab Results  Component Value Date   WBC 5.4 04/16/2021   HGB 12.2 04/16/2021   HCT 38.0 04/16/2021   MCV 90.9 04/16/2021   PLT 299 04/16/2021   Lab Results  Component Value Date   NA 142 04/16/2021   K 4.0 04/16/2021   CHLORIDE 105 10/30/2014   CO2 29 04/16/2021   GLUCOSE 118 (H) 04/16/2021   BUN 14 04/16/2021   CREATININE 0.94 04/16/2021   BILITOT 0.4 04/16/2021   ALKPHOS 100 03/02/2018   AST 22 04/16/2021   ALT 18 04/16/2021   PROT 7.1 04/16/2021   ALBUMIN 4.0 03/02/2018   CALCIUM 10.0 04/16/2021   ANIONGAP 8 03/11/2018   EGFR 65 04/16/2021   Lab Results  Component Value Date   CHOL 168 04/16/2021   Lab Results  Component Value Date   HDL 73 04/16/2021   Lab Results  Component Value Date   LDLCALC 74 04/16/2021   Lab Results  Component Value Date   TRIG 120 04/16/2021   Lab Results  Component Value Date   CHOLHDL 2.3 04/16/2021   Lab Results  Component Value Date   HGBA1C 6.4 (A) 07/21/2021      Assessment & Plan:   Problem List Items Addressed This Visit       Cardiovascular and Mediastinum   Chronic systolic heart failure (HCC)    No sign of volume overload on exam today.      Other Visit Diagnoses     Dehydration    -  Primary    Relevant Orders   BASIC METABOLIC PANEL WITH GFR   POCT URINALYSIS DIP (CLINITEK) (Completed)   Upper back pain       Relevant Orders   DG Thoracic Spine 2 View   POCT URINALYSIS DIP (CLINITEK) (Completed)   Constipation, unspecified constipation type           Dehydration-she is feels like she is doing better by getting her fluids and she says she is drinking 8 ounces every 2-3 hours.  We may be able to decrease her  Bumex down to 1 tab daily or alternate between 2 and 1.  Upper back pain-it is pretty significant today she seems pretty uncomfortable.  She is actually tender directly over her spine so I am concerned about potential for a compression fracture.  Though she did have a CT scan done of the chest and abdomen in the ED but they did not note anything specific about her spine so I am can go ahead and get a plain film x-ray today.  Constipation-continue with Linzess until she has a soft bowel movement she is worried about getting dehydrated again but right now she is constipated so I am less concerned with the hot and she is able to take p.o. fluids.  We did discuss that it is possible to take too much vitamin B and it can be dangerous and so to just take as prescribed on the bottle which is typically once a day.  No orders of the defined types were placed in this encounter.   Follow-up: Return in about 1 week (around 09/23/2021).    Beatrice Lecher, MD

## 2021-09-17 ENCOUNTER — Encounter: Payer: Self-pay | Admitting: Family Medicine

## 2021-09-17 ENCOUNTER — Other Ambulatory Visit: Payer: Self-pay

## 2021-09-17 DIAGNOSIS — M549 Dorsalgia, unspecified: Secondary | ICD-10-CM

## 2021-09-17 MED ORDER — POTASSIUM CHLORIDE CRYS ER 10 MEQ PO TBCR: 10.0000 meq | EXTENDED_RELEASE_TABLET | Freq: Every day | ORAL | 1 refills | Status: DC

## 2021-09-17 NOTE — Addendum Note (Signed)
Addended by: Beatrice Lecher D on: 09/17/2021 09:37 AM   Modules accepted: Orders

## 2021-09-17 NOTE — Progress Notes (Signed)
Hi Tanya Hogan, your kidney function looks stable.  No sign of dehydration on your blood work which is great so you are doing a fantastic job keeping your hydration up.  Potassium was a little borderline low.  So I will go ahead and send over a prescription for daily potassium for you to start and then we can recheck your blood level in about 2 weeks.

## 2021-09-17 NOTE — Progress Notes (Signed)
Hi [name], x-ray of your thoracic spine looks okay there is some arthritis but nothing worrisome such as a fracture which is what I was concerned about.  I like to get you into physical therapy for your back if you are open to that then please let me know and we can get you scheduled down the hall.

## 2021-09-18 ENCOUNTER — Ambulatory Visit (INDEPENDENT_AMBULATORY_CARE_PROVIDER_SITE_OTHER): Payer: Medicare Other | Admitting: Family Medicine

## 2021-09-18 ENCOUNTER — Other Ambulatory Visit: Payer: Self-pay

## 2021-09-18 VITALS — HR 90

## 2021-09-18 DIAGNOSIS — M549 Dorsalgia, unspecified: Secondary | ICD-10-CM | POA: Diagnosis not present

## 2021-09-18 MED ORDER — KETOROLAC TROMETHAMINE 60 MG/2ML IM SOLN
60.0000 mg | Freq: Once | INTRAMUSCULAR | Status: AC
  Administered 2021-09-18: 60 mg via INTRAMUSCULAR

## 2021-09-18 NOTE — Progress Notes (Signed)
Agree with documentation as above.   Deboraha Goar, MD  

## 2021-09-18 NOTE — Progress Notes (Signed)
Established Patient Office Visit  Subjective:  Patient ID: Tanya Hogan, female    DOB: 10-21-1950  Age: 71 y.o. MRN: [id number]  CC:  Chief Complaint  Patient presents with   Back Pain    HPI Tanya Hogan presents for Toradol injection.    Past Medical History:  Diagnosis Date   Alkaline phosphatase elevation    Asthma    Breast cancer ([place]) 2011   Carpal tunnel syndrome    Carpal tunnel syndrome    bilateral   CHF (congestive heart failure) (HCC)    Cirrhosis, non-alcoholic (HCC)    Closed fracture of unspecified part of radius (alone)    DDD (degenerative disc disease), lumbar    Depression    pt denies   Diabetes mellitus without complication ([place])    Enlarged heart    Fibromyalgia    GERD (gastroesophageal reflux disease)    History of kidney stones    HTN (hypertension)    Hypothyroidism    IBS (irritable bowel syndrome)    LBBB (left bundle branch block)    Microcytic anemia 05/14/2017   Pneumonia    Presence of permanent cardiac pacemaker    PVD (peripheral vascular disease) ([place])    2019   Sleep apnea    uses cpap    Past Surgical History:  Procedure Laterality Date   BREAST LUMPECTOMY Left 2011   COLONOSCOPY     FOOT SURGERY     KNEE SURGERY     MASTECTOMY Right    05/2019   PACEMAKER GENERATOR CHANGE N/A 08/23/2014   Procedure: PACEMAKER GENERATOR CHANGE;  Surgeon: Evans Lance, MD;  [facility];  Service: Cardiovascular;  Laterality: N/A;   PACEMAKER PLACEMENT  2009   [facility]   RECTAL SURGERY  1976   REDUCTION MAMMAPLASTY Left    05/2019   TOTAL SHOULDER ARTHROPLASTY Right 03/10/2018   TOTAL SHOULDER ARTHROPLASTY Right 03/10/2018   Procedure: RIGHT TOTAL SHOULDER ARTHROPLASTY;  Surgeon: [name], MD;  Location: [facility];  Service: Orthopedics;  Laterality: Right;   TUBAL LIGATION      Family History  Problem Relation Age of Onset   Heart disease Mother    Melanoma Mother    Parkinson's disease Mother     Heart disease Father    Heart disease Brother    Heart failure Other    Breast cancer Other    Hypertension Other     Social History   Socioeconomic History   Marital status: Divorced    Spouse name: Not on file   Number of children: 2   Years of education: 16   Highest education level: Associate degree: academic program  Occupational History   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 Lives with and helps her brother. Likes Systems developer, Social worker and music. Loves animals.   Social Determinants of Health   Financial Resource Strain: Low Risk  Difficulty of Paying Living Expenses: Not hard at all  Food Insecurity: No Food Insecurity   Worried About [place] in the Last Year: Never true   Ran Out of Food in the Last Year: Never true  Transportation Needs: No Transportation Needs   Lack of Transportation (Medical): No   Lack of Transportation (Non-Medical): No  Physical Activity: Inactive   Days of Exercise per Week: 0 days   Minutes of Exercise per Session: 0 min  Stress: No Stress Concern Present   Feeling of Stress : Not at all  Social Connections: Socially Isolated   Frequency of Communication with Friends and Family: More than three times a week   Frequency of Social Gatherings with Friends and Family: Once a week   Attends Religious Services: Never   Marine scientist or Organizations: No   Attends Music therapist: Never   Marital Status: Divorced  Human resources officer Violence: Not At Risk    Fear of Current or Ex-Partner: No   Emotionally Abused: No   Physically Abused: No   Sexually Abused: No    Outpatient Medications Prior to Visit  Medication Sig Dispense Refill   albuterol (PROVENTIL) (2.5 MG/3ML) 0.083% nebulizer solution Take 2.5 mg by nebulization every 6 (six) hours as needed for wheezing or shortness of breath.     albuterol (VENTOLIN HFA) 108 (90 Base) MCG/ACT inhaler Inhale 1-2 puffs into the lungs every 6 (six) hours as needed for wheezing or shortness of breath. 54 g 3   alendronate (FOSAMAX) 70 MG tablet TAKE 1 TABLET BY MOUTH  EVERY 7 DAYS WITH A FULL  GLASS OF WATER ON AN EMPTY  STOMACH 12 tablet 4   AMBULATORY NON FORMULARY MEDICATION Medication Name: Tubing and supplies for nebulizer. Fax - [phone] 1 vial 0   amiodarone (PACERONE) 200 MG tablet TAKE ONE-HALF TABLET BY  MOUTH DAILY 45 tablet 2   B Complex-C (B-COMPLEX WITH VITAMIN C) tablet Take 1 tablet by mouth daily.     bumetanide (BUMEX) 1 MG tablet TAKE 1 TABLET BY MOUTH  TWICE DAILY 180 tablet 0   Calcium Citrate-Vitamin D (CITRACAL + D PO) Take 1 tablet by mouth daily.     Continuous Blood Gluc Receiver (FREESTYLE LIBRE 2 READER) DEVI by Does not apply route.     Continuous Blood Gluc Sensor (FREESTYLE LIBRE 2 SENSOR) MISC by Does not apply route.     dicyclomine (BENTYL) 20 MG tablet Take by mouth.     diltiazem (CARDIZEM CD) 180 MG 24 hr capsule TAKE 1 CAPSULE BY MOUTH  DAILY 90 capsule 0   EPIPEN 2-PAK 0.3 MG/0.3ML SOAJ injection INJECT 1 PEN INTO MUSCLE OF LATERAL THIGH FOR SEVERE ALLERGIC REACTION. CALL 911 AFTER USING THIS MEDICATION. 1 each 2   ferrous sulfate 324 MG TBEC Take 324 mg by mouth.     Insulin Pen Needle 31G X 8 MM MISC To be used for injecting insulin daily at bedtime. Dx: E11.8 100 each 3   levothyroxine (SYNTHROID) 88 MCG tablet Take 1 tablet (88 mcg total) by mouth daily. 90 tablet 0   LINZESS 72 MCG capsule Take 72 mcg by mouth 2 (two) times daily.     Multiple  Vitamins-Minerals (OCUVITE EYE + MULTI) TABS Take 1 tablet by mouth daily.     pantoprazole (PROTONIX) 40 MG tablet Take 40 mg by mouth daily.     potassium chloride (KLOR-CON M) 10 MEQ tablet Take 1 tablet (10 mEq total)  by mouth daily. 30 tablet 1   pravastatin (PRAVACHOL) 40 MG tablet TAKE 1 TABLET BY MOUTH AT  BEDTIME 90 tablet 3   Probiotic Product (PROBIOTIC BLEND PO) Take 1 tablet by mouth daily.     Semaglutide,0.25 or 0.5MG/DOS, (OZEMPIC, 0.25 OR 0.5 MG/DOSE,) 2 MG/1.5ML SOPN Inject 0.25 mg into the skin.     valACYclovir (VALTREX) 1000 MG tablet Take 1 tablet (1,000 mg total) by mouth 2 (two) times daily. 14 tablet 2   fluticasone-salmeterol (ADVAIR HFA) 115-21 MCG/ACT inhaler Inhale 2 puffs into the lungs 2 (two) times daily.     Tiotropium Bromide Monohydrate 2.5 MCG/ACT AERS Inhale 2 puffs into the lungs daily.     No facility-administered medications prior to visit.    Allergies  Allergen Reactions   Ferric Carboxymaltose Shortness Of Breath    Mild SOB following injectafer infusion Mild SOB following injectafer infusion    Penicillins Anaphylaxis    Has patient had a PCN reaction causing immediate rash, facial/tongue/throat swelling, SOB or lightheadedness with hypotension: Yes Has patient had a PCN reaction causing severe rash involving mucus membranes or skin necrosis: No Has patient had a PCN reaction that required hospitalization: No Has patient had a PCN reaction occurring within the last 10 years: No If all of the above answers are "NO", then may proceed with Cephalosporin use.    Accupril [Quinapril Hcl] Other (See Comments)    Hyperkalemia   Atorvastatin Other (See Comments)    msucle aches   Celebrex [Celecoxib]     ulcers   Fluticasone Other (See Comments)    Nasal ulcer.     Levocetirizine     Doesn't recall   Levomenol Other (See Comments)    unknown unknown    Losartan Potassium Itching   Naproxen Swelling   Nasonex [Mometasone Furoate] Other (See  Comments)    nosebleed   Chamomile Other (See Comments)    unknown   Vesicare [Solifenacin] Other (See Comments)    "stopped me from urinating"   Aspirin Other (See Comments)    Pt states "burns holes in stomach", ulcers    Baking Soda-Fluoride [Sodium Fluoride] Itching   Clindamycin/Lincomycin Rash   Codeine Nausea And Vomiting   Furosemide Other (See Comments)    Nose bleed   Lactose    Lactose Intolerance (Gi) Other (See Comments)    Increased calcium, sneezing, nasal issues   Lanolin-Petrolatum Rash   Metformin And Related Diarrhea   Miralax [Polyethylene Glycol 3350]    Miralax [Polyethylene Glycol] Rash   Nasonex [Mometasone Furoate] Other (See Comments)   Quinine Rash   Rifampin Other (See Comments)    DOES NOT REMEMBER THIS MEDICATION OR ALLERGY   Sulfa Antibiotics Rash   Sulfonamide Derivatives Rash   Tramadol Rash    Anxiety   Wellbutrin [Bupropion] Other (See Comments)    Muscle aches, pain, "made my skin crawl"   Wool Alcohol [Lanolin] Rash   Zoledronic Acid Other (See Comments) and Rash    shaking    ROS Review of Systems    Objective:    Physical Exam  Pulse 90    SpO2 97%  Wt Readings from Last 3 Encounters:  09/16/21 206 lb (93.4 kg)  07/29/21 217 lb (98.4 kg)  07/21/21 217 lb (98.4 kg)     Health Maintenance Due  Topic Date Due   URINE MICROALBUMIN  09/16/2021    There are no preventive care reminders to display for this patient.  Lab Results  Component  Value Date   TSH 2.54 08/14/2021   Lab Results  Component Value Date   WBC 5.4 04/16/2021   HGB 12.2 04/16/2021   HCT 38.0 04/16/2021   MCV 90.9 04/16/2021   PLT 299 04/16/2021   Lab Results  Component Value Date   NA 143 09/16/2021   K 3.4 (L) 09/16/2021   CHLORIDE 105 10/30/2014   CO2 34 (H) 09/16/2021   GLUCOSE 119 (H) 09/16/2021   BUN 12 09/16/2021   CREATININE 0.89 09/16/2021   BILITOT 0.4 04/16/2021   ALKPHOS 100 03/02/2018   AST 22 04/16/2021   ALT 18 04/16/2021    PROT 7.1 04/16/2021   ALBUMIN 4.0 03/02/2018   CALCIUM 10.2 09/16/2021   ANIONGAP 8 03/11/2018   EGFR 69 09/16/2021   Lab Results  Component Value Date   CHOL 168 04/16/2021   Lab Results  Component Value Date   HDL 73 04/16/2021   Lab Results  Component Value Date   LDLCALC 74 04/16/2021   Lab Results  Component Value Date   TRIG 120 04/16/2021   Lab Results  Component Value Date   CHOLHDL 2.3 04/16/2021   Lab Results  Component Value Date   HGBA1C 6.4 (A) 07/21/2021      Assessment & Plan:  Back pain - Patient tolerated injection well without complications.   Problem List Items Addressed This Visit   None Visit Diagnoses     Upper back pain    -  Primary   Relevant Medications   ketorolac (TORADOL) injection 60 mg (Completed) (Start on 09/18/2021 11:00 AM)       Meds ordered this encounter  Medications   ketorolac (TORADOL) injection 60 mg    Follow-up: Return if symptoms worsen or fail to improve.    Lavell Luster, [place]

## 2021-09-18 NOTE — Telephone Encounter (Signed)
Discussed with patient during visit.

## 2021-09-20 DIAGNOSIS — I517 Cardiomegaly: Secondary | ICD-10-CM | POA: Diagnosis not present

## 2021-09-20 DIAGNOSIS — R0989 Other specified symptoms and signs involving the circulatory and respiratory systems: Secondary | ICD-10-CM | POA: Diagnosis not present

## 2021-09-20 DIAGNOSIS — Z79899 Other long term (current) drug therapy: Secondary | ICD-10-CM | POA: Diagnosis not present

## 2021-09-20 DIAGNOSIS — Z794 Long term (current) use of insulin: Secondary | ICD-10-CM | POA: Diagnosis not present

## 2021-09-20 DIAGNOSIS — I5023 Acute on chronic systolic (congestive) heart failure: Secondary | ICD-10-CM | POA: Diagnosis not present

## 2021-09-20 DIAGNOSIS — J449 Chronic obstructive pulmonary disease, unspecified: Secondary | ICD-10-CM | POA: Diagnosis not present

## 2021-09-20 DIAGNOSIS — I509 Heart failure, unspecified: Secondary | ICD-10-CM | POA: Diagnosis not present

## 2021-09-20 DIAGNOSIS — Z7951 Long term (current) use of inhaled steroids: Secondary | ICD-10-CM | POA: Diagnosis not present

## 2021-09-20 DIAGNOSIS — E039 Hypothyroidism, unspecified: Secondary | ICD-10-CM | POA: Diagnosis not present

## 2021-09-20 DIAGNOSIS — Z23 Encounter for immunization: Secondary | ICD-10-CM | POA: Diagnosis not present

## 2021-09-20 DIAGNOSIS — E119 Type 2 diabetes mellitus without complications: Secondary | ICD-10-CM | POA: Diagnosis not present

## 2021-09-20 DIAGNOSIS — K589 Irritable bowel syndrome without diarrhea: Secondary | ICD-10-CM | POA: Diagnosis not present

## 2021-09-20 DIAGNOSIS — J90 Pleural effusion, not elsewhere classified: Secondary | ICD-10-CM | POA: Diagnosis not present

## 2021-09-20 DIAGNOSIS — J9601 Acute respiratory failure with hypoxia: Secondary | ICD-10-CM | POA: Diagnosis not present

## 2021-09-20 DIAGNOSIS — R918 Other nonspecific abnormal finding of lung field: Secondary | ICD-10-CM | POA: Diagnosis not present

## 2021-09-20 DIAGNOSIS — I110 Hypertensive heart disease with heart failure: Secondary | ICD-10-CM | POA: Diagnosis not present

## 2021-09-20 DIAGNOSIS — I4729 Other ventricular tachycardia: Secondary | ICD-10-CM | POA: Diagnosis not present

## 2021-09-20 DIAGNOSIS — Z20822 Contact with and (suspected) exposure to covid-19: Secondary | ICD-10-CM | POA: Diagnosis not present

## 2021-09-21 DIAGNOSIS — I4729 Other ventricular tachycardia: Secondary | ICD-10-CM | POA: Diagnosis not present

## 2021-09-21 DIAGNOSIS — I10 Essential (primary) hypertension: Secondary | ICD-10-CM | POA: Diagnosis not present

## 2021-09-21 DIAGNOSIS — J9601 Acute respiratory failure with hypoxia: Secondary | ICD-10-CM | POA: Diagnosis not present

## 2021-09-21 DIAGNOSIS — I517 Cardiomegaly: Secondary | ICD-10-CM | POA: Diagnosis not present

## 2021-09-21 DIAGNOSIS — K581 Irritable bowel syndrome with constipation: Secondary | ICD-10-CM | POA: Diagnosis not present

## 2021-09-21 DIAGNOSIS — E119 Type 2 diabetes mellitus without complications: Secondary | ICD-10-CM | POA: Diagnosis not present

## 2021-09-21 DIAGNOSIS — J449 Chronic obstructive pulmonary disease, unspecified: Secondary | ICD-10-CM | POA: Diagnosis not present

## 2021-09-21 DIAGNOSIS — R9431 Abnormal electrocardiogram [ECG] [EKG]: Secondary | ICD-10-CM | POA: Diagnosis not present

## 2021-09-21 DIAGNOSIS — E785 Hyperlipidemia, unspecified: Secondary | ICD-10-CM | POA: Diagnosis not present

## 2021-09-21 DIAGNOSIS — I081 Rheumatic disorders of both mitral and tricuspid valves: Secondary | ICD-10-CM | POA: Diagnosis not present

## 2021-09-21 DIAGNOSIS — E039 Hypothyroidism, unspecified: Secondary | ICD-10-CM | POA: Diagnosis not present

## 2021-09-21 DIAGNOSIS — I5023 Acute on chronic systolic (congestive) heart failure: Secondary | ICD-10-CM | POA: Diagnosis not present

## 2021-09-22 DIAGNOSIS — I5023 Acute on chronic systolic (congestive) heart failure: Secondary | ICD-10-CM | POA: Diagnosis not present

## 2021-09-22 DIAGNOSIS — E039 Hypothyroidism, unspecified: Secondary | ICD-10-CM | POA: Diagnosis not present

## 2021-09-22 DIAGNOSIS — E119 Type 2 diabetes mellitus without complications: Secondary | ICD-10-CM | POA: Diagnosis not present

## 2021-09-22 DIAGNOSIS — I10 Essential (primary) hypertension: Secondary | ICD-10-CM | POA: Diagnosis not present

## 2021-09-22 DIAGNOSIS — J449 Chronic obstructive pulmonary disease, unspecified: Secondary | ICD-10-CM | POA: Diagnosis not present

## 2021-09-22 DIAGNOSIS — J9601 Acute respiratory failure with hypoxia: Secondary | ICD-10-CM | POA: Diagnosis not present

## 2021-09-23 ENCOUNTER — Ambulatory Visit (INDEPENDENT_AMBULATORY_CARE_PROVIDER_SITE_OTHER): Payer: Medicare Other | Admitting: Family Medicine

## 2021-09-23 ENCOUNTER — Encounter: Payer: Self-pay | Admitting: Family Medicine

## 2021-09-23 ENCOUNTER — Other Ambulatory Visit: Payer: Self-pay

## 2021-09-23 VITALS — BP 109/56 | HR 93 | Resp 18 | Ht 61.0 in | Wt 202.0 lb

## 2021-09-23 DIAGNOSIS — I10 Essential (primary) hypertension: Secondary | ICD-10-CM

## 2021-09-23 DIAGNOSIS — I5022 Chronic systolic (congestive) heart failure: Secondary | ICD-10-CM

## 2021-09-23 DIAGNOSIS — E118 Type 2 diabetes mellitus with unspecified complications: Secondary | ICD-10-CM

## 2021-09-23 DIAGNOSIS — R0602 Shortness of breath: Secondary | ICD-10-CM | POA: Diagnosis not present

## 2021-09-23 DIAGNOSIS — E1169 Type 2 diabetes mellitus with other specified complication: Secondary | ICD-10-CM | POA: Diagnosis not present

## 2021-09-23 DIAGNOSIS — E785 Hyperlipidemia, unspecified: Secondary | ICD-10-CM | POA: Diagnosis not present

## 2021-09-23 MED ORDER — SPIRONOLACTONE 25 MG PO TABS: 25.0000 mg | ORAL_TABLET | Freq: Every day | ORAL | 3 refills | Status: DC

## 2021-09-23 NOTE — Progress Notes (Signed)
Established Patient Office Visit  Subjective:  Patient ID: Tanya Hogan, female    DOB: 27-Aug-1950  Age: 71 y.o. MRN: [id number]  CC:  Chief Complaint  Patient presents with   Follow up     Hospital. Patient would like to discuss medications. Patient stated hospital would like for her to change to Jardiance instead of Ozempic and Metoprolol instead of Ditalizem. Patiemnt would like to know what to do?    HPI Tanya Hogan presents for spittle follow-up for acute on chronic heart failure with reduced EF.  She was admitted on January 28 and discharged home on January 30.  Acute problem was acute hypoxemic respiratory failure.  After admission they wanted to switch her Bystolic to diltiazem and start her on Jardiance instead of Ozempic.  She said she spent most of yesterday just sleeping she feels completely exhausted.  She does wear her CPAP at night when she sleeps or if she takes a nap.  She she is feeling very overwhelmed today and became very tearful during the conversation as she is also the primary caretaker for her brother who lives with her.  She just has not been able to do things around the house and that she has to go to the grocery store today or she says they will not have anything to eat but she just feels weak and tired.  Past Medical History:  Diagnosis Date   Alkaline phosphatase elevation    Asthma    Breast cancer ([place]) 2011   Carpal tunnel syndrome    Carpal tunnel syndrome    bilateral   CHF (congestive heart failure) (HCC)    Cirrhosis, non-alcoholic (HCC)    Closed fracture of unspecified part of radius (alone)    DDD (degenerative disc disease), lumbar    Depression    pt denies   Diabetes mellitus without complication ([place])    Enlarged heart    Fibromyalgia    GERD (gastroesophageal reflux disease)    History of kidney stones    HTN (hypertension)    Hypothyroidism    IBS (irritable bowel syndrome)    LBBB (left bundle branch block)    Microcytic  anemia 05/14/2017   Pneumonia    Presence of permanent cardiac pacemaker    PVD (peripheral vascular disease) ([employer])    2019   Sleep apnea    uses cpap    Past Surgical History:  Procedure Laterality Date   BREAST LUMPECTOMY Left 2011   COLONOSCOPY     FOOT SURGERY     KNEE SURGERY     MASTECTOMY Right    05/2019   PACEMAKER GENERATOR CHANGE N/A 08/23/2014   Procedure: PACEMAKER GENERATOR CHANGE;  Surgeon: Evans Lance, MD;  Location: [facility] C[facility];  Service: Cardiovascular;  Laterality: N/A;   PACEMAKER PLACEMENT  2009   [facility]   RECTAL SURGERY  1976   REDUCTION MAMMAPLASTY Left    05/2019   TOTAL SHOULDER ARTHROPLASTY Right 03/10/2018   TOTAL SHOULDER ARTHROPLASTY Right 03/10/2018   Procedure: RIGHT TOTAL SHOULDER ARTHROPLASTY;  Surgeon: [name], MD;  Location: [facility];  Service: Orthopedics;  Laterality: Right;   TUBAL LIGATION      Family History  Problem Relation Age of Onset   Heart disease Mother    Melanoma Mother    Parkinson's disease Mother    Heart disease Father    Heart disease Brother    Heart failure Other    Breast cancer Other  Hypertension Other     Social History   Socioeconomic History   Marital status: Divorced    Spouse name: Not on file   Number of children: 2   Years of education: 16   Highest education level: Associate degree: academic program  Occupational History   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 Lives with and helps her brother. Likes Systems developer, Social worker and  music. Loves animals.   Social Determinants of Health   Financial Resource Strain: Low Risk    Difficulty of Paying Living Expenses: Not hard at all  Food Insecurity: No Food Insecurity   Worried About Charity fundraiser in the Last Year: Never true   [place] in the Last Year: Never true  Transportation Needs: No Transportation Needs   Lack of Transportation (Medical): No   Lack of Transportation (Non-Medical): No  Physical Activity: Inactive   Days of Exercise per Week: 0 days   Minutes of Exercise per Session: 0 min  Stress: No Stress Concern Present   Feeling of Stress : Not at all  Social Connections: Socially Isolated   Frequency of Communication with Friends and Family: More than three times a week   Frequency of Social Gatherings with Friends and Family: Once a week   Attends Religious Services: Never   Marine scientist or Organizations: No   Attends Music therapist: Never   Marital Status: Divorced  Human resources officer Violence: Not At Risk   Fear of Current or Ex-Partner: No   Emotionally Abused: No   Physically Abused: No   Sexually Abused: No    Outpatient Medications Prior to Visit  Medication Sig Dispense Refill   albuterol (PROVENTIL) (2.5 MG/3ML) 0.083% nebulizer solution Take 2.5 mg by nebulization every 6 (six) hours as needed for wheezing or shortness of breath.     albuterol (VENTOLIN HFA) 108 (90 Base) MCG/ACT inhaler Inhale 1-2 puffs into the lungs every 6 (six) hours as needed for wheezing or shortness of breath. 54 g 3   alendronate (FOSAMAX) 70 MG tablet TAKE 1 TABLET BY MOUTH  EVERY 7 DAYS WITH A FULL  GLASS OF WATER ON AN EMPTY  STOMACH 12 tablet 4   AMBULATORY NON FORMULARY MEDICATION Medication Name: Tubing and supplies for nebulizer. Fax - [phone] 1 vial 0   amiodarone (PACERONE) 200 MG tablet TAKE ONE-HALF TABLET BY  MOUTH DAILY 45 tablet 2   B Complex-C (B-COMPLEX WITH VITAMIN C) tablet Take 1 tablet by mouth daily.      bumetanide (BUMEX) 1 MG tablet TAKE 1 TABLET BY MOUTH  TWICE DAILY 180 tablet 0   Calcium Citrate-Vitamin D (CITRACAL + D PO) Take 1 tablet by mouth daily.     Continuous Blood Gluc Receiver (FREESTYLE LIBRE 2 READER) DEVI by Does not apply route.     Continuous Blood Gluc Sensor (FREESTYLE LIBRE 2 SENSOR) MISC by Does not  apply route.     diltiazem (CARDIZEM CD) 180 MG 24 hr capsule TAKE 1 CAPSULE BY MOUTH  DAILY 90 capsule 0   ferrous sulfate 324 MG TBEC Take 324 mg by mouth.     Insulin Pen Needle 31G X 8 MM MISC To be used for injecting insulin daily at bedtime. Dx: E11.8 100 each 3   levothyroxine (SYNTHROID) 88 MCG tablet Take 1 tablet (88 mcg total) by mouth daily. 90 tablet 0   LINZESS 72 MCG capsule Take 72 mcg by mouth 2 (two) times daily.     Multiple Vitamins-Minerals (OCUVITE EYE + MULTI) TABS Take 1 tablet by mouth daily.     pantoprazole (PROTONIX) 40 MG tablet Take 40 mg by mouth daily.     potassium chloride (KLOR-CON M) 10 MEQ tablet Take 1 tablet (10 mEq total) by mouth daily. 30 tablet 1   pravastatin (PRAVACHOL) 40 MG tablet TAKE 1 TABLET BY MOUTH AT  BEDTIME 90 tablet 3   Semaglutide,0.25 or 0.5MG/DOS, (OZEMPIC, 0.25 OR 0.5 MG/DOSE,) 2 MG/1.5ML SOPN Inject 0.25 mg into the skin.     valACYclovir (VALTREX) 1000 MG tablet Take 1 tablet (1,000 mg total) by mouth 2 (two) times daily. 14 tablet 2   dicyclomine (BENTYL) 20 MG tablet Take 20 mg by mouth daily as needed. Patient takes 1 tablet daily as needed for abdominal pain.     EPIPEN 2-PAK 0.3 MG/0.3ML SOAJ injection INJECT 1 PEN INTO MUSCLE OF LATERAL THIGH FOR SEVERE ALLERGIC REACTION. CALL 911 AFTER USING THIS MEDICATION. 1 each 2   Probiotic Product (PROBIOTIC BLEND PO) Take 1 tablet by mouth daily.     fluticasone-salmeterol (ADVAIR HFA) 115-21 MCG/ACT inhaler Inhale 2 puffs into the lungs 2 (two) times daily.     Tiotropium Bromide Monohydrate 2.5 MCG/ACT AERS Inhale 2 puffs into the lungs daily.     No  facility-administered medications prior to visit.    Allergies  Allergen Reactions   Ferric Carboxymaltose Shortness Of Breath    Mild SOB following injectafer infusion Mild SOB following injectafer infusion    Penicillins Anaphylaxis    Has patient had a PCN reaction causing immediate rash, facial/tongue/throat swelling, SOB or lightheadedness with hypotension: Yes Has patient had a PCN reaction causing severe rash involving mucus membranes or skin necrosis: No Has patient had a PCN reaction that required hospitalization: No Has patient had a PCN reaction occurring within the last 10 years: No If all of the above answers are "NO", then may proceed with Cephalosporin use.    Accupril [Quinapril Hcl] Other (See Comments)    Hyperkalemia   Atorvastatin Other (See Comments)    msucle aches   Celebrex [Celecoxib]     ulcers   Fluticasone Other (See Comments)    Nasal ulcer.     Levocetirizine     Doesn't recall   Levomenol Other (See Comments)    unknown unknown    Losartan Potassium Itching   Naproxen Swelling   Nasonex [Mometasone Furoate] Other (See Comments)    nosebleed   Chamomile Other (See Comments)    unknown   Vesicare [Solifenacin] Other (See Comments)    "stopped me from urinating"   Aspirin Other (See Comments)    Pt states "burns holes in stomach", ulcers    Baking Soda-Fluoride [Sodium Fluoride] Itching   Clindamycin/Lincomycin Rash   Codeine Nausea And Vomiting   Furosemide Other (See Comments)    Nose bleed   Lactose    Lactose Intolerance (Gi)  Other (See Comments)    Increased calcium, sneezing, nasal issues   Lanolin-Petrolatum Rash   Metformin And Related Diarrhea   Miralax [Polyethylene Glycol 3350]    Miralax [Polyethylene Glycol] Rash   Nasonex [Mometasone Furoate] Other (See Comments)   Quinine Rash   Rifampin Other (See Comments)    DOES NOT REMEMBER THIS MEDICATION OR ALLERGY   Sulfa Antibiotics Rash   Sulfonamide Derivatives Rash    Tramadol Rash    Anxiety   Wellbutrin [Bupropion] Other (See Comments)    Muscle aches, pain, "made my skin crawl"   Wool Alcohol [Lanolin] Rash   Zoledronic Acid Other (See Comments) and Rash    shaking    ROS Review of Systems    Objective:    Physical Exam Constitutional:      Appearance: Normal appearance. She is well-developed.  HENT:     Head: Normocephalic and atraumatic.  Cardiovascular:     Rate and Rhythm: Normal rate and regular rhythm.     Heart sounds: Normal heart sounds.  Pulmonary:     Effort: Pulmonary effort is normal.     Breath sounds: Normal breath sounds.  Skin:    General: Skin is warm and dry.     Comments: Edema around the ankles bilaterally.  Neurological:     Mental Status: She is alert and oriented to person, place, and time.  Psychiatric:        Behavior: Behavior normal.    BP (!) 109/56    Pulse 93    Resp 18    Ht _0  (1.549 m)    Wt 202 lb (91.6 kg)    SpO2 98%    BMI 38.17 kg/m  Wt Readings from Last 3 Encounters:  09/23/21 202 lb (91.6 kg)  09/16/21 206 lb (93.4 kg)  07/29/21 217 lb (98.4 kg)     Health Maintenance Due  Topic Date Due   URINE MICROALBUMIN  09/16/2021    There are no preventive care reminders to display for this patient.  Lab Results  Component Value Date   TSH 2.54 08/14/2021   Lab Results  Component Value Date   WBC 5.4 04/16/2021   HGB 12.2 04/16/2021   HCT 38.0 04/16/2021   MCV 90.9 04/16/2021   PLT 299 04/16/2021   Lab Results  Component Value Date   NA 143 09/16/2021   K 3.4 (L) 09/16/2021   CHLORIDE 105 10/30/2014   CO2 34 (H) 09/16/2021   GLUCOSE 119 (H) 09/16/2021   BUN 12 09/16/2021   CREATININE 0.89 09/16/2021   BILITOT 0.4 04/16/2021   ALKPHOS 100 03/02/2018   AST 22 04/16/2021   ALT 18 04/16/2021   PROT 7.1 04/16/2021   ALBUMIN 4.0 03/02/2018   CALCIUM 10.2 09/16/2021   ANIONGAP 8 03/11/2018   EGFR 69 09/16/2021   Lab Results  Component Value Date   CHOL 168 04/16/2021    Lab Results  Component Value Date   HDL 73 04/16/2021   Lab Results  Component Value Date   LDLCALC 74 04/16/2021   Lab Results  Component Value Date   TRIG 120 04/16/2021   Lab Results  Component Value Date   CHOLHDL 2.3 04/16/2021   Lab Results  Component Value Date   HGBA1C 6.4 (A) 07/21/2021      Assessment & Plan:   Problem List Items Addressed This Visit       Cardiovascular and Mediastinum   Chronic systolic heart failure ([place])    Your  dry weight is 200 pounds on your home scale.  So if at any point your weight hits 203 or higher, please take an extra Bumex and please take an extra potassium.  You do need to take your potassium daily. We are going to order home health so if you do not get a phone call from them by the end of the week then please let us know. Please start the spironolactone. We will hold off on the Jardiance for now until we can come up with a plan with Dr. Lovena Le. Please continue your Ozempic.  Do not drink more than 50 ounces total of fluid a day.       Relevant Medications   spironolactone (ALDACTONE) 25 MG tablet   Other Relevant Orders   Ambulatory referral to Home Health   Benign essential hypertension    Pressure looks really good today if anything it is a little bit on the low side.  But she was diuresed at least 10 pounds.      Relevant Medications   spironolactone (ALDACTONE) 25 MG tablet     Endocrine   Controlled diabetes mellitus type 2 with complications ([place]) - Primary    She is actually done really well on Ozempic so far and in fact her last A1c was 6.4 some very hesitant to take this away because it is pretty powerful and it has also helped keep her weight under control.  But I do understand the need for considering Jardiance for heart failure.  But she is also concerned about the cost.  Speak with our clinical pharmacist to discuss options and maybe reach out to Dr. Lovena Le.  Patient does report she has a appointment  scheduled in about a week      Relevant Orders   BASIC METABOLIC PANEL WITH GFR   Urine Microalbumin w/creat. ratio   Other Visit Diagnoses     SOB (shortness of breath) on exertion       Relevant Orders   Ambulatory referral to [facility] to clarify her medications because they did make some changes . [name]  They also recommended that she come off of Ozempic and use Jardiance.  They wanted her to start spironolactone.  She is taking her Bumex twice a day.  May have to have her come back in with her medication bottles and make sure that we are all on the same page.  Meds ordered this encounter  Medications   spironolactone (ALDACTONE) 25 MG tablet    Sig: Take 1 tablet (25 mg total) by mouth daily.    Dispense:  90 tablet    Refill:  3    Follow-up: Return in about 3 weeks (around 10/14/2021) for follow up .   I spent 45 minutes on the day of the encounter to include pre-visit record review, face-to-face time with the patient and post visit ordering of test.   Beatrice Lecher, MD

## 2021-09-23 NOTE — Assessment & Plan Note (Signed)
She is actually done really well on Ozempic so far and in fact her last A1c was 6.4 some very hesitant to take this away because it is pretty powerful and it has also helped keep her weight under control.  But I do understand the need for considering Jardiance for heart failure.  But she is also concerned about the cost.  Speak with our clinical pharmacist to discuss options and maybe reach out to Dr. Lovena Le.  Patient does report she has a appointment scheduled in about a week

## 2021-09-23 NOTE — Assessment & Plan Note (Signed)
Pressure looks really good today if anything it is a little bit on the low side.  But she was diuresed at least 10 pounds.

## 2021-09-23 NOTE — Patient Instructions (Addendum)
Your dry weight is 200 pounds on your home scale.  So if at any point your weight hits 203 or higher, please take an extra Bumex and please take an extra potassium.  You do need to take your potassium daily. We are going to order home health so if you do not get a phone call from them by the end of the week then please let us know. Please start the spironolactone. We will hold off on the Jardiance for now until we can come up with a plan with Dr. Lovena Le. Please continue your Ozempic.  Do not drink more than 50 ounces total of fluid a day.

## 2021-09-23 NOTE — Assessment & Plan Note (Signed)
Your dry weight is 200 pounds on your home scale.  So if at any point your weight hits 203 or higher, please take an extra Bumex and please take an extra potassium.  You do need to take your potassium daily. We are going to order home health so if you do not get a phone call from them by the end of the week then please let us know. Please start the spironolactone. We will hold off on the Jardiance for now until we can come up with a plan with Dr. Lovena Le. Please continue your Ozempic.  Do not drink more than 50 ounces total of fluid a day.

## 2021-09-24 ENCOUNTER — Other Ambulatory Visit: Payer: Self-pay

## 2021-09-24 ENCOUNTER — Other Ambulatory Visit: Payer: Self-pay | Admitting: Family Medicine

## 2021-09-24 LAB — BASIC METABOLIC PANEL WITH GFR
BUN/Creatinine Ratio: 13 (calc) (ref 6–22)
BUN: 15 mg/dL (ref 7–25)
CO2: 26 mmol/L (ref 20–32)
Calcium: 9.7 mg/dL (ref 8.6–10.4)
Chloride: 104 mmol/L (ref 98–110)
Creat: 1.12 mg/dL — ABNORMAL HIGH (ref 0.60–1.00)
Glucose, Bld: 156 mg/dL — ABNORMAL HIGH (ref 65–99)
Potassium: 4.2 mmol/L (ref 3.5–5.3)
Sodium: 141 mmol/L (ref 135–146)
eGFR: 52 mL/min/{1.73_m2} — ABNORMAL LOW (ref >=60)

## 2021-09-24 LAB — MICROALBUMIN / CREATININE URINE RATIO
Creatinine, Urine: 83 mg/dL (ref 20–275)
Microalb Creat Ratio: 24 mcg/mg creat (ref <30)
Microalb, Ur: 2 mg/dL

## 2021-09-24 MED ORDER — SPIRONOLACTONE 25 MG PO TABS: 25.0000 mg | ORAL_TABLET | Freq: Every day | ORAL | 0 refills | Status: DC

## 2021-09-24 NOTE — Progress Notes (Signed)
Hi Tanya Hogan, kidney functions up just slightly normally around 0.8 this time it was 1.1.  I do want to just keep an eye on this and we will plan to recheck it again in about a week.  Your potassium actually looks really good at 4.2 which is fantastic continue to take your potassium daily.  No worrisome protein levels in the urine which is great.

## 2021-09-26 ENCOUNTER — Encounter: Payer: Self-pay | Admitting: Family Medicine

## 2021-09-26 ENCOUNTER — Ambulatory Visit (INDEPENDENT_AMBULATORY_CARE_PROVIDER_SITE_OTHER): Payer: Medicare Other | Admitting: Family Medicine

## 2021-09-26 ENCOUNTER — Other Ambulatory Visit: Payer: Self-pay

## 2021-09-26 VITALS — BP 97/59 | HR 91 | Wt 201.0 lb

## 2021-09-26 DIAGNOSIS — R0602 Shortness of breath: Secondary | ICD-10-CM

## 2021-09-26 DIAGNOSIS — R42 Dizziness and giddiness: Secondary | ICD-10-CM

## 2021-09-26 DIAGNOSIS — I5022 Chronic systolic (congestive) heart failure: Secondary | ICD-10-CM

## 2021-09-26 DIAGNOSIS — D0511 Intraductal carcinoma in situ of right breast: Secondary | ICD-10-CM | POA: Diagnosis not present

## 2021-09-26 NOTE — Assessment & Plan Note (Signed)
Currently not on ACE or ARB due to interactions and allergies.  So she will not be a candidate for Entresto either.  Has been intolerant to beta-blocker therapy in the past but may be willing to try another low-dose at 1 point she was actually on carvedilol 3.125 mg and did okay but it was increased and that is when she started not feeling well.  We certainly could consider restarting the medication.  I also like to consider restarting the spironolactone at half of a tab if her blood pressure tolerates it and if her potassium levels were okay.  We will also look into potentially adding Jardiance for heart failure to check on cost and coverage with her insurance plan.

## 2021-09-26 NOTE — Progress Notes (Signed)
Acute Office Visit  Subjective:    Patient ID: Tanya Hogan, female    DOB: May 12, 1951, 71 y.o.   MRN: [id number]  Chief Complaint  Patient presents with   Shortness of Breath    HPI Patient is in today for SOB.  She says she has been SOB since she was released from the hospital.  But she says that after she started the spironolactone about 3 days ago she just felt like her arms and legs have been extremely heavy.  She says she feels like it was hard to move out of her chair and she felt like it now feels like it is hard to take a deep breath.  Is a little unclear exactly when the shortness of breath started.  She has been monitoring her fluid and has not gained any weight she has been trying to stick to the fluid restrictions.  She feels like her symptoms are related to starting the spironolactone.  She also reports that she feels dizzy today.  She denies feeling vertigo.  But also does not quite feel like she is going to pass out but does feel little lightheaded when she stands up.  Past Medical History:  Diagnosis Date   Alkaline phosphatase elevation    Asthma    Breast cancer ([place]) 2011   Carpal tunnel syndrome    Carpal tunnel syndrome    bilateral   CHF (congestive heart failure) (HCC)    Cirrhosis, non-alcoholic (HCC)    Closed fracture of unspecified part of radius (alone)    DDD (degenerative disc disease), lumbar    Depression    pt denies   Diabetes mellitus without complication ([place])    Enlarged heart    Fibromyalgia    GERD (gastroesophageal reflux disease)    History of kidney stones    HTN (hypertension)    Hypothyroidism    IBS (irritable bowel syndrome)    LBBB (left bundle branch block)    Microcytic anemia 05/14/2017   Pneumonia    Presence of permanent cardiac pacemaker    PVD (peripheral vascular disease) ([place])    2019   Sleep apnea    uses cpap    Past Surgical History:  Procedure Laterality Date   BREAST LUMPECTOMY Left 2011   COLONOSCOPY      FOOT SURGERY     KNEE SURGERY     MASTECTOMY Right    05/2019   PACEMAKER GENERATOR CHANGE N/A 08/23/2014   Procedure: PACEMAKER GENERATOR CHANGE;  Surgeon: Evans Lance, MD;  Location: [facility] CATH LAB;  Service: Cardiovascular;  Laterality: N/A;   PACEMAKER PLACEMENT  2009   [facility]   RECTAL SURGERY  1976   REDUCTION MAMMAPLASTY Left    05/2019   TOTAL SHOULDER ARTHROPLASTY Right 03/10/2018   TOTAL SHOULDER ARTHROPLASTY Right 03/10/2018   Procedure: RIGHT TOTAL SHOULDER ARTHROPLASTY;  Surgeon: [name], MD;  Location: [facility];  Service: Orthopedics;  Laterality: Right;   TUBAL LIGATION      Family History  Problem Relation Age of Onset   Heart disease Mother    Melanoma Mother    Parkinson's disease Mother    Heart disease Father    Heart disease Brother    Heart failure Other    Breast cancer Other    Hypertension Other     Social History   Socioeconomic History   Marital status: Divorced    Spouse name: Not on file   Number of children: 2  Years of education: 6   Highest education level: Associate degree: academic program  Occupational History   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 Lives with and helps her brother. Likes Systems developer, Social worker and music. Loves animals.   Social Determinants of Health   Financial Resource Strain: Low Risk    Difficulty of Paying Living Expenses: Not hard at all  Food Insecurity: No Food Insecurity   Worried About Charity fundraiser in the Last Year: Never true    [place] in the Last Year: Never true  Transportation Needs: No Transportation Needs   Lack of Transportation (Medical): No   Lack of Transportation (Non-Medical): No  Physical Activity: Inactive   Days of Exercise per Week: 0 days   Minutes of Exercise per Session: 0 min  Stress: No Stress Concern Present   Feeling of Stress : Not at all  Social Connections: Socially Isolated   Frequency of Communication with Friends and Family: More than three times a week   Frequency of Social Gatherings with Friends and Family: Once a week   Attends Religious Services: Never   Marine scientist or Organizations: No   Attends Music therapist: Never   Marital Status: Divorced  Human resources officer Violence: Not At Risk   Fear of Current or Ex-Partner: No   Emotionally Abused: No   Physically Abused: No   Sexually Abused: No    Outpatient Medications Prior to Visit  Medication Sig Dispense Refill   albuterol (PROVENTIL) (2.5 MG/3ML) 0.083% nebulizer solution Take 2.5 mg by nebulization every 6 (six) hours as needed for wheezing or shortness of breath.     albuterol (VENTOLIN HFA) 108 (90 Base) MCG/ACT inhaler Inhale 1-2 puffs into the lungs every 6 (six) hours as needed for wheezing or shortness of breath. 54 g 3   alendronate (FOSAMAX) 70 MG tablet TAKE 1 TABLET BY MOUTH  EVERY 7 DAYS WITH A FULL  GLASS OF WATER ON AN EMPTY  STOMACH 12 tablet 4   AMBULATORY NON FORMULARY MEDICATION Medication Name: Tubing and supplies for nebulizer. Fax - [phone] 1 vial 0   amiodarone (PACERONE) 200 MG tablet TAKE ONE-HALF TABLET BY  MOUTH DAILY 45 tablet 2   B Complex-C (B-COMPLEX WITH VITAMIN C) tablet Take 1 tablet by mouth daily.     bumetanide (BUMEX) 1 MG tablet TAKE 1 TABLET BY MOUTH  TWICE DAILY 180 tablet 0   Calcium Citrate-Vitamin D (CITRACAL + D PO) Take 1 tablet by mouth daily.     Continuous Blood Gluc Receiver (FREESTYLE LIBRE 2 READER) DEVI by Does not apply route.      Continuous Blood Gluc Sensor (FREESTYLE LIBRE 2 SENSOR) MISC by Does not apply route.     diltiazem (CARDIZEM CD) 180 MG 24 hr capsule TAKE 1 CAPSULE BY MOUTH  DAILY 90 capsule 0   ferrous sulfate 324 MG TBEC Take 324  mg by mouth.     Insulin Pen Needle 31G X 8 MM MISC To be used for injecting insulin daily at bedtime. Dx: E11.8 100 each 3   levothyroxine (SYNTHROID) 88 MCG tablet TAKE 1 TABLET(88 MCG) BY MOUTH DAILY 90 tablet 0   LINZESS 72 MCG capsule Take 72 mcg by mouth 2 (two) times daily.     Multiple Vitamins-Minerals (OCUVITE EYE + MULTI) TABS Take 1 tablet by mouth daily.     pantoprazole (PROTONIX) 40 MG tablet Take 40 mg by mouth daily.     potassium chloride (KLOR-CON M) 10 MEQ tablet Take 1 tablet (10 mEq total) by mouth daily. 30 tablet 1   pravastatin (PRAVACHOL) 40 MG tablet TAKE 1 TABLET BY MOUTH AT  BEDTIME 90 tablet 3   Semaglutide,0.25 or 0.5MG/DOS, (OZEMPIC, 0.25 OR 0.5 MG/DOSE,) 2 MG/1.5ML SOPN Inject 0.25 mg into the skin.     spironolactone (ALDACTONE) 25 MG tablet Take 1 tablet (25 mg total) by mouth daily. 15 tablet 0   valACYclovir (VALTREX) 1000 MG tablet Take 1 tablet (1,000 mg total) by mouth 2 (two) times daily. 14 tablet 2   fluticasone-salmeterol (ADVAIR HFA) 115-21 MCG/ACT inhaler Inhale 2 puffs into the lungs 2 (two) times daily.     Tiotropium Bromide Monohydrate 2.5 MCG/ACT AERS Inhale 2 puffs into the lungs daily.     No facility-administered medications prior to visit.    Allergies  Allergen Reactions   Ferric Carboxymaltose Shortness Of Breath    Mild SOB following injectafer infusion Mild SOB following injectafer infusion    Penicillins Anaphylaxis    Has patient had a PCN reaction causing immediate rash, facial/tongue/throat swelling, SOB or lightheadedness with hypotension: Yes Has patient had a PCN reaction causing severe rash involving mucus membranes or skin necrosis: No Has patient had a PCN reaction that required hospitalization: No Has  patient had a PCN reaction occurring within the last 10 years: No If all of the above answers are "NO", then may proceed with Cephalosporin use.    Accupril [Quinapril Hcl] Other (See Comments)    Hyperkalemia   Atorvastatin Other (See Comments)    msucle aches   Celebrex [Celecoxib]     ulcers   Fluticasone Other (See Comments)    Nasal ulcer.     Levocetirizine     Doesn't recall   Levomenol Other (See Comments)    unknown unknown    Losartan Potassium Itching   Naproxen Swelling   Nasonex [Mometasone Furoate] Other (See Comments)    nosebleed   Chamomile Other (See Comments)    unknown   Vesicare [Solifenacin] Other (See Comments)    "stopped me from urinating"   Aspirin Other (See Comments)    Pt states "burns holes in stomach", ulcers    Baking Soda-Fluoride [Sodium Fluoride] Itching   Clindamycin/Lincomycin Rash   Codeine Nausea And Vomiting   Furosemide Other (See Comments)    Nose bleed   Lactose    Lactose Intolerance (Gi) Other (See Comments)    Increased calcium, sneezing, nasal issues   Lanolin-Petrolatum Rash   Metformin And Related Diarrhea   Miralax [Polyethylene Glycol 3350]    Miralax [Polyethylene Glycol] Rash   Nasonex [Mometasone Furoate] Other (See Comments)   Quinine Rash   Rifampin Other (See Comments)    DOES NOT REMEMBER THIS MEDICATION OR ALLERGY   Sulfa Antibiotics Rash   Sulfonamide Derivatives Rash   Tramadol Rash    Anxiety   Wellbutrin [Bupropion] Other (See Comments)  Muscle aches, pain, "made my skin crawl"   Wool Alcohol [Lanolin] Rash   Zoledronic Acid Other (See Comments) and Rash    shaking    Review of Systems     Objective:    Physical Exam Constitutional:      Appearance: She is well-developed.  HENT:     Head: Normocephalic and atraumatic.     Right Ear: External ear normal.     Left Ear: External ear normal.     Nose: Nose normal.  Eyes:     Conjunctiva/sclera: Conjunctivae normal.     Pupils: Pupils are  equal, round, and reactive to light.  Neck:     Thyroid: No thyromegaly.  Cardiovascular:     Rate and Rhythm: Normal rate and regular rhythm.     Heart sounds: Normal heart sounds.  Pulmonary:     Effort: Pulmonary effort is normal.     Breath sounds: Normal breath sounds. No wheezing.  Musculoskeletal:     Cervical back: Neck supple.  Lymphadenopathy:     Cervical: No cervical adenopathy.  Skin:    General: Skin is warm and dry.  Neurological:     Mental Status: She is alert and oriented to person, place, and time.    BP (!) 97/59    Pulse 91    Wt 201 lb (91.2 kg)    SpO2 98%    BMI 37.98 kg/m  Wt Readings from Last 3 Encounters:  09/26/21 201 lb (91.2 kg)  09/23/21 202 lb (91.6 kg)  09/16/21 206 lb (93.4 kg)    There are no preventive care reminders to display for this patient.  There are no preventive care reminders to display for this patient.   Lab Results  Component Value Date   TSH 2.54 08/14/2021   Lab Results  Component Value Date   WBC 5.4 04/16/2021   HGB 12.2 04/16/2021   HCT 38.0 04/16/2021   MCV 90.9 04/16/2021   PLT 299 04/16/2021   Lab Results  Component Value Date   NA 141 09/23/2021   K 4.2 09/23/2021   CHLORIDE 105 10/30/2014   CO2 26 09/23/2021   GLUCOSE 156 (H) 09/23/2021   BUN 15 09/23/2021   CREATININE 1.12 (H) 09/23/2021   BILITOT 0.4 04/16/2021   ALKPHOS 100 03/02/2018   AST 22 04/16/2021   ALT 18 04/16/2021   PROT 7.1 04/16/2021   ALBUMIN 4.0 03/02/2018   CALCIUM 9.7 09/23/2021   ANIONGAP 8 03/11/2018   EGFR 52 (L) 09/23/2021   Lab Results  Component Value Date   CHOL 168 04/16/2021   Lab Results  Component Value Date   HDL 73 04/16/2021   Lab Results  Component Value Date   LDLCALC 74 04/16/2021   Lab Results  Component Value Date   TRIG 120 04/16/2021   Lab Results  Component Value Date   CHOLHDL 2.3 04/16/2021   Lab Results  Component Value Date   HGBA1C 6.4 (A) 07/21/2021       Assessment & Plan:    Problem List Items Addressed This Visit       Cardiovascular and Mediastinum   Chronic systolic heart failure (HCC)    Currently not on ACE or ARB due to interactions and allergies.  So she will not be a candidate for Entresto either.  Has been intolerant to beta-blocker therapy in the past but may be willing to try another low-dose at 1 point she was actually on carvedilol 3.125 mg and did okay  but it was increased and that is when she started not feeling well.  We certainly could consider restarting the medication.  I also like to consider restarting the spironolactone at half of a tab if her blood pressure tolerates it and if her potassium levels were okay.  We will also look into potentially adding Jardiance for heart failure to check on cost and coverage with her insurance plan.      Other Visit Diagnoses     Lightheadedness    -  Primary   SOB (shortness of breath)          Shortness of breath-chest is clear on exam today.  Pulse ox is normal though she does says it drops a little bit with activity.  She does not she felt like she could not do a walk test today.  Did encourage her to go ahead and skip her spironolactone for today she has not taken it yet just to see if she feels better we will check on labs that were ordered by the oncologist either later today or tomorrow  Lightheadedness-I would replete her blood pressure and systolic was less than 048 so I do suspect that she might be having a little hypotension when she stands up I am to have her hold her spironolactone over the weekend.  She did have some blood work drawn with her oncologist today so I want to see what her potassium looks like I wonder if this could be causing some of the heaviness feeling that she is getting in her limbs or if it is just related to low blood pressure we may try to restart the spironolactone at half of a tab instead of a whole 25 mg.  No orders of the defined types were placed in this  encounter.    Beatrice Lecher, MD

## 2021-09-26 NOTE — Progress Notes (Addendum)
Office Visit    Patient Name: Tanya Hogan Date of Encounter: 09/29/2021  PCP:  Hali Marry, MD   [facility]  Cardiologist:  Cristopher Peru, MD  Advanced Practice Provider:  No care team member to display Electrophysiologist:  None    HPI    Tanya Hogan is a 71 y.o. female with a hx of chronic systolic heart failure and CHB status post biventricular PPM, depression, GERD, LBBB, diabetes mellitus without complication, hypertension, hypothyroidism presents today for follow-up.  The patient had breast cancer and was status post meniscectomy.  She has been hospital for CHF exacerbation early 2022.  She had an SVT was placed on amiodarone which she did not tolerate but does tolerate 100 mg daily.  She was last seen in the office April 2022 by Dr. Lovena Le at that time she was not having any angina.  Her left heart cath demonstrated no CAD.  Her EF was 35 to 40% by echocardiogram.  She remained on Coreg and amiodarone.  She had been intolerant to ACE inhibitor.  At that time no medication changes were made and she appeared euvolemic on exam.  Patient was recently in the ED over at [place] on 09/12/2021.  Noted to be in decompensated heart failure with a BNP of 4700 and chest x-ray showing evidence of vascular congestion with bilateral pleural effusions.  She is started on Bumex twice daily with improvement of symptoms.  She initially was requiring 3 L of oxygen was able to be weaned down to room air.  Repeat echocardiogram showed EF of 20%.  Patient achieved euvolemia and was started on spironolactone and Jardiance at discharge.  Today, she states that she felt like she was being held down and had no energy. She has felt much better after discontinuing this medication. She continues to have weight loss since starting on Ozempic. She has not noticed any significant edema in her legs. She states she does try to maintain a low-sodium, heart healthy diet  however it is challenging because her diet is already restricted with her IBS. She also has many medication intolerances and allergies. She is willing however, to try Coreg at the lowest dose. She is also interested in participating in cardiac rehab as long as her insurance pays for it. We discussed getting her in to see a nutritionist as well which she seemed interested in.  Reports no chest pain, pressure, or tightness. No orthopnea, PND. Reports no palpitations.    Past Medical History    Past Medical History:  Diagnosis Date   Alkaline phosphatase elevation    Asthma    Breast cancer ([place]) 2011   Carpal tunnel syndrome    Carpal tunnel syndrome    bilateral   CHF (congestive heart failure) (HCC)    Cirrhosis, non-alcoholic (HCC)    Closed fracture of unspecified part of radius (alone)    DDD (degenerative disc disease), lumbar    Depression    pt denies   Diabetes mellitus without complication ([place])    Enlarged heart    Fibromyalgia    GERD (gastroesophageal reflux disease)    History of kidney stones    HTN (hypertension)    Hypothyroidism    IBS (irritable bowel syndrome)    LBBB (left bundle branch block)    Microcytic anemia 05/14/2017   Pneumonia    Presence of permanent cardiac pacemaker    PVD (peripheral vascular disease) ([place])    2019   Sleep  apnea    uses cpap   Past Surgical History:  Procedure Laterality Date   BREAST LUMPECTOMY Left 2011   COLONOSCOPY     FOOT SURGERY     KNEE SURGERY     MASTECTOMY Right    05/2019   PACEMAKER GENERATOR CHANGE N/A 08/23/2014   Procedure: PACEMAKER GENERATOR CHANGE;  Surgeon: Evans Lance, MD;  Location: [facility] CATH LAB;  Service: Cardiovascular;  Laterality: N/A;   PACEMAKER PLACEMENT  2009   [facility]   RECTAL SURGERY  1976   REDUCTION MAMMAPLASTY Left    05/2019   TOTAL SHOULDER ARTHROPLASTY Right 03/10/2018   TOTAL SHOULDER ARTHROPLASTY Right 03/10/2018   Procedure: RIGHT TOTAL SHOULDER ARTHROPLASTY;   Surgeon: [name], MD;  Location: [facility];  Service: Orthopedics;  Laterality: Right;   TUBAL LIGATION      Allergies  Allergies  Allergen Reactions   Ferric Carboxymaltose Shortness Of Breath    Mild SOB following injectafer infusion Mild SOB following injectafer infusion    Penicillins Anaphylaxis    Has patient had a PCN reaction causing immediate rash, facial/tongue/throat swelling, SOB or lightheadedness with hypotension: Yes Has patient had a PCN reaction causing severe rash involving mucus membranes or skin necrosis: No Has patient had a PCN reaction that required hospitalization: No Has patient had a PCN reaction occurring within the last 10 years: No If all of the above answers are "NO", then may proceed with Cephalosporin use.    Accupril [Quinapril Hcl] Other (See Comments)    Hyperkalemia   Atorvastatin Other (See Comments)    msucle aches   Celebrex [Celecoxib]     ulcers   Fluticasone Other (See Comments)    Nasal ulcer.     Levocetirizine     Doesn't recall   Levomenol Other (See Comments)    unknown unknown    Losartan Potassium Itching   Naproxen Swelling   Nasonex [Mometasone Furoate] Other (See Comments)    nosebleed   Chamomile Other (See Comments)    unknown   Vesicare [Solifenacin] Other (See Comments)    "stopped me from urinating"   Aspirin Other (See Comments)    Pt states "burns holes in stomach", ulcers    Baking Soda-Fluoride [Sodium Fluoride] Itching   Clindamycin/Lincomycin Rash   Codeine Nausea And Vomiting   Furosemide Other (See Comments)    Nose bleed   Lactose    Lactose Intolerance (Gi) Other (See Comments)    Increased calcium, sneezing, nasal issues   Lanolin-Petrolatum Rash   Metformin And Related Diarrhea   Miralax [Polyethylene Glycol 3350]    Miralax [Polyethylene Glycol] Rash   Nasonex [Mometasone Furoate] Other (See Comments)   Quinine Rash   Rifampin Other (See Comments)    DOES NOT REMEMBER THIS MEDICATION  OR ALLERGY   Sulfa Antibiotics Rash   Sulfonamide Derivatives Rash   Tramadol Rash    Anxiety   Wellbutrin [Bupropion] Other (See Comments)    Muscle aches, pain, "made my skin crawl"   Wool Alcohol [Lanolin] Rash   Zoledronic Acid Other (See Comments) and Rash    shaking     EKGs/Labs/Other Studies Reviewed:   The following studies were reviewed today:  Echocardiogram 09/21/2021   The study was technically difficult.    Left Ventricle: Left ventricle is mildly dilated.Systolic function is  severely abnormal. EF: 20 %.    Right Ventricle: Right ventricle is mildly dilated. Systolic function  is moderately reduced. Abnormal tricuspid annular plane systolic excursion  (  TAPSE) <1.7 cm.    Left Atrium: Left atrium is mildly dilated.    Mitral Valve: There is mild regurgitation with a centrally directed  jet.    Tricuspid Valve: The right ventricular systolic pressure is mildly  elevated (37-49 mmHg).   EKG:  EKG is not ordered today.   Recent Labs: 04/16/2021: ALT 18; Hemoglobin 12.2; Platelets 299 08/14/2021: TSH 2.54 09/23/2021: BUN 15; Creat 1.12; Potassium 4.2; Sodium 141  Recent Lipid Panel    Component Value Date/Time   CHOL 168 04/16/2021 0000   TRIG 120 04/16/2021 0000   HDL 73 04/16/2021 0000   CHOLHDL 2.3 04/16/2021 0000   VLDL 26 02/18/2017 0939   LDLCALC 74 04/16/2021 0000     Home Medications   Current Meds  Medication Sig   albuterol (PROVENTIL) (2.5 MG/3ML) 0.083% nebulizer solution Take 2.5 mg by nebulization every 6 (six) hours as needed for wheezing or shortness of breath.   albuterol (VENTOLIN HFA) 108 (90 Base) MCG/ACT inhaler Inhale 1-2 puffs into the lungs every 6 (six) hours as needed for wheezing or shortness of breath.   alendronate (FOSAMAX) 70 MG tablet TAKE 1 TABLET BY MOUTH  EVERY 7 DAYS WITH A FULL  GLASS OF WATER ON AN EMPTY  STOMACH   ALLEGRA-D ALLERGY & CONGESTION 60-120 MG 12 hr tablet    AMBULATORY NON FORMULARY MEDICATION  Medication Name: Tubing and supplies for nebulizer. Fax - [phone]   amiodarone (PACERONE) 200 MG tablet TAKE ONE-HALF TABLET BY  MOUTH DAILY   B Complex-C (B-COMPLEX WITH VITAMIN C) tablet Take 1 tablet by mouth daily.   bumetanide (BUMEX) 1 MG tablet TAKE 1 TABLET BY MOUTH  TWICE DAILY   Calcium Citrate-Vitamin D (CITRACAL + D PO) Take 1 tablet by mouth daily.   carvedilol (COREG) 3.125 MG tablet Take 1 tablet (3.125 mg total) by mouth 2 (two) times daily.   Continuous Blood Gluc Receiver (FREESTYLE LIBRE 2 READER) DEVI by Does not apply route.   Continuous Blood Gluc Sensor (FREESTYLE LIBRE 2 SENSOR) MISC by Does not apply route.   diltiazem (CARDIZEM CD) 180 MG 24 hr capsule TAKE 1 CAPSULE BY MOUTH  DAILY   levothyroxine (SYNTHROID) 88 MCG tablet TAKE 1 TABLET(88 MCG) BY MOUTH DAILY   LINZESS 72 MCG capsule Take 72 mcg by mouth 2 (two) times daily.   Multiple Vitamins-Minerals (OCUVITE EYE + MULTI) TABS Take 1 tablet by mouth daily.   pantoprazole (PROTONIX) 40 MG tablet Take 40 mg by mouth daily.   potassium chloride (KLOR-CON M) 10 MEQ tablet Take 1 tablet (10 mEq total) by mouth daily.   pravastatin (PRAVACHOL) 40 MG tablet TAKE 1 TABLET BY MOUTH AT  BEDTIME   Probiotic Product ([employer] PROBIOTIC) CAPS    Semaglutide,0.25 or 0.5MG/DOS, (OZEMPIC, 0.25 OR 0.5 MG/DOSE,) 2 MG/1.5ML SOPN Inject 0.25 mg into the skin.   valACYclovir (VALTREX) 1000 MG tablet Take 1 tablet (1,000 mg total) by mouth 2 (two) times daily.     Review of Systems      All other systems reviewed and are otherwise negative except as noted above.  Physical Exam    VS:  BP 136/86    Pulse 93    Ht 5' 1.5" (1.562 m)    Wt 199 lb 14.4 oz (90.7 kg)    BMI 37.16 kg/m  , BMI Body mass index is 37.16 kg/m.  Wt Readings from Last 3 Encounters:  09/29/21 199 lb 14.4 oz (90.7 kg)  09/26/21 201 lb (  91.2 kg)  09/23/21 202 lb (91.6 kg)     GEN: Well nourished, well developed, in no acute distress. HEENT:  normal. Neck: Supple, no JVD, carotid bruits, or masses. Cardiac: RRR, no murmurs, rubs, or gallops. No clubbing, cyanosis, edema.  Radials/PT 2+ and equal bilaterally.  Respiratory:  Respirations regular and unlabored, clear to auscultation bilaterally. GI: Soft, nontender, nondistended. MS: No deformity or atrophy. Skin: Warm and dry, no rash. Neuro:  Strength and sensation are intact. Psych: Normal affect.  Assessment & Plan    Acute on chronic HFrEF -She appears euvolemic on exam today -She has stopped spironolactone due to fatigue and feels better since doing so -Continue GDMT: Bumex 1 mg twice daily, diltiazem 180 mg daily, Ozempic 0.25 mg injection -Heart failure medications have been difficult to add due to a lot of medication intolerances and allergies. -We will add Coreg 3.125 mg twice daily to her regimen today. -Cardiac rehab referral sent - Discontinue Diltiazem as not recommended in HFrEF at next appointment if she tolerates Coreg 3.154m  COPD -Continue follow-up with your pulmonologist  Hypertension -Well-controlled today at the clinic -Continue to monitor your blood pressure at home  Diabetes mellitus type 2 -Recently started on Ozempic, Jardiance prescription was never filled since it is still going through the insurance authorization process. -Blood glucose was well controlled on current regimen -Referral to nutritionist for assistance with diet for IBS, HF, and DM2  NSVT -Amio 1044mdaily -She has not had any recurrent symptoms  Hypothyroidism -Continue to f/u with your PCP    Cardiac Rehabilitation Eligibility Assessment      Disposition: Follow up 1 month with GrCristopher PeruMD or APP.  Signed, TeElgie CollardPA-C 09/29/2021, 11:48 AM [place]

## 2021-09-26 NOTE — Patient Instructions (Signed)
Ok to stop your spironolactone for the weekend until we see your labs back Continue to monitor your weight daily.   Go ahead and take 1 potassium pill daily starting today.

## 2021-09-29 ENCOUNTER — Ambulatory Visit (INDEPENDENT_AMBULATORY_CARE_PROVIDER_SITE_OTHER): Payer: Medicare Other

## 2021-09-29 ENCOUNTER — Encounter (HOSPITAL_BASED_OUTPATIENT_CLINIC_OR_DEPARTMENT_OTHER): Payer: Self-pay | Admitting: Physician Assistant

## 2021-09-29 ENCOUNTER — Other Ambulatory Visit: Payer: Self-pay

## 2021-09-29 ENCOUNTER — Ambulatory Visit (HOSPITAL_BASED_OUTPATIENT_CLINIC_OR_DEPARTMENT_OTHER): Payer: Medicare Other | Admitting: Physician Assistant

## 2021-09-29 VITALS — BP 136/86 | HR 93 | Ht 61.5 in | Wt 199.9 lb

## 2021-09-29 DIAGNOSIS — I5020 Unspecified systolic (congestive) heart failure: Secondary | ICD-10-CM | POA: Diagnosis not present

## 2021-09-29 DIAGNOSIS — E119 Type 2 diabetes mellitus without complications: Secondary | ICD-10-CM

## 2021-09-29 DIAGNOSIS — I4729 Other ventricular tachycardia: Secondary | ICD-10-CM

## 2021-09-29 DIAGNOSIS — E038 Other specified hypothyroidism: Secondary | ICD-10-CM | POA: Diagnosis not present

## 2021-09-29 DIAGNOSIS — I442 Atrioventricular block, complete: Secondary | ICD-10-CM

## 2021-09-29 DIAGNOSIS — I10 Essential (primary) hypertension: Secondary | ICD-10-CM | POA: Diagnosis not present

## 2021-09-29 DIAGNOSIS — Z794 Long term (current) use of insulin: Secondary | ICD-10-CM | POA: Diagnosis not present

## 2021-09-29 MED ORDER — EMPAGLIFLOZIN 10 MG PO TABS: 10.0000 mg | ORAL_TABLET | Freq: Every day | ORAL | 1 refills | Status: DC

## 2021-09-29 MED ORDER — CARVEDILOL 3.125 MG PO TABS: 3.1250 mg | ORAL_TABLET | Freq: Two times a day (BID) | ORAL | 3 refills | Status: DC

## 2021-09-29 NOTE — Patient Instructions (Signed)
Medication Instructions:  Your physician has recommended you make the following change in your medication:   Start: Coreg 3.113m twice daily   *If you need a refill on your cardiac medications before your next appointment, please call your pharmacy*   Lab Work: None ordered today   Testing/Procedures: None ordered today    Follow-Up: [facility] you and your health needs are our priority.  As part of our continuing mission to provide you with exceptional heart care, we have created designated Provider Care Teams.  These Care Teams include your primary Cardiologist (physician) and Advanced Practice Providers (APPs -  Physician Assistants and Nurse Practitioners) who all work together to provide you with the care you need, when you need it.  We recommend signing up for the patient portal called "MyChart".  Sign up information is provided on this After Visit Summary.  MyChart is used to connect with patients for Virtual Visits (Telemedicine).  Patients are able to view lab/test results, encounter notes, upcoming appointments, etc.  Non-urgent messages can be sent to your provider as well.   To learn more about what you can do with MyChart, go to [email or web address]    Your next appointment:   1 month(s)  The format for your next appointment:   In Person  Provider:  [facility]  If primary card or EP is not listed click here to update    :1}    Other Instructions We have placed referrals for you to the nutrition center and Cardiac Rehab they should be reaching out to you!    Recommend weighing daily and keeping a log. Please call our office if you have weight gain of 2 pounds overnight or 5 pounds in 1 week.   Date  Time Weight

## 2021-09-29 NOTE — Addendum Note (Signed)
Addended by: Gerald Stabs on: 09/29/2021 01:31 PM   Modules accepted: Orders

## 2021-09-29 NOTE — Addendum Note (Signed)
Addended by: Beatrice Lecher D on: 09/29/2021 05:19 PM   Modules accepted: Orders

## 2021-09-30 ENCOUNTER — Telehealth: Payer: Self-pay

## 2021-09-30 LAB — CUP PACEART REMOTE DEVICE CHECK
Battery Remaining Longevity: 7 mo
Battery Voltage: 2.85 V
Brady Statistic AP VP Percent: 3.41 %
Brady Statistic AP VS Percent: 0.01 %
Brady Statistic AS VP Percent: 95.57 %
Brady Statistic AS VS Percent: 1.01 %
Brady Statistic RA Percent Paced: 3.37 %
Brady Statistic RV Percent Paced: 98.61 %
Date Time Interrogation Session: 20230206071714
Implantable Lead Implant Date: 20030324
Implantable Lead Implant Date: 20030324
Implantable Lead Implant Date: 20030324
Implantable Lead Location: 753858
Implantable Lead Location: 753859
Implantable Lead Location: 753860
Implantable Lead Model: 4068
Implantable Lead Model: 4068
Implantable Lead Model: 4193
Implantable Pulse Generator Implant Date: 20151231
Lead Channel Impedance Value: 361 Ohm
Lead Channel Impedance Value: 380 Ohm
Lead Channel Impedance Value: 380 Ohm
Lead Channel Impedance Value: 4047 Ohm
Lead Channel Impedance Value: 4047 Ohm
Lead Channel Impedance Value: 4047 Ohm
Lead Channel Impedance Value: 532 Ohm
Lead Channel Impedance Value: 665 Ohm
Lead Channel Impedance Value: 874 Ohm
Lead Channel Pacing Threshold Amplitude: 1.5 V
Lead Channel Pacing Threshold Amplitude: 2.125 V
Lead Channel Pacing Threshold Pulse Width: 0.4 ms
Lead Channel Pacing Threshold Pulse Width: 0.4 ms
Lead Channel Sensing Intrinsic Amplitude: 1.75 mV
Lead Channel Sensing Intrinsic Amplitude: 1.75 mV
Lead Channel Sensing Intrinsic Amplitude: 13.25 mV
Lead Channel Sensing Intrinsic Amplitude: 13.25 mV
Lead Channel Setting Pacing Amplitude: 2.25 V
Lead Channel Setting Pacing Amplitude: 2.5 V
Lead Channel Setting Pacing Amplitude: 3.5 V
Lead Channel Setting Pacing Pulse Width: 0.8 ms
Lead Channel Setting Pacing Pulse Width: 0.8 ms
Lead Channel Setting Sensing Sensitivity: 4 mV

## 2021-09-30 NOTE — Telephone Encounter (Signed)
Scheduled remote reviewed. Normal device function.   9 NSVT, 5-15 beats, 1 monitored VT, 37mn 31sec in duration HR 195 19 AF, available EGM's appear to be FF Battery estimated 752moext remote to be determined Route to triage regarding battery and monitored VT   Spoke with patient.  advised of battery nearing ERI.  Scheduled for monthly battery checks.  Next scheduled check 10/28/21.    Discussed Monitored VT episode on 09/06/21.  Pt was in [facility] ED that day with N/V and abdominal pain.    Patient meds include Amiodarone 10075maily,  Diltiazem 180m36mily, Carevedilol 3.125mg60m  Pt confirmed compliance with meds as ordered.

## 2021-10-01 ENCOUNTER — Ambulatory Visit: Payer: Medicare Other | Admitting: Physical Therapy

## 2021-10-01 NOTE — Telephone Encounter (Signed)
Attempted to contact patient for apt in clinic for device reprogramming per Dr. Lovena Le.  Patient will need inclinic apt with EP APP for programming or device clinic apt with Dr. Lovena Le is in the office to confirm changes.   No answer, LMTCB.

## 2021-10-01 NOTE — Telephone Encounter (Signed)
Dr. Lovena Le, patient has pacemaker. Do you want her medications adjusted? Need to see if patient just needs programming in device or else I will schedule with EP APP.

## 2021-10-01 NOTE — Telephone Encounter (Signed)
We need to reprogram her device to treat that VT.

## 2021-10-02 NOTE — Progress Notes (Signed)
Remote pacemaker transmission.   

## 2021-10-03 ENCOUNTER — Telehealth: Payer: Self-pay | Admitting: Neurology

## 2021-10-03 ENCOUNTER — Telehealth: Payer: Self-pay | Admitting: Internal Medicine

## 2021-10-03 MED ORDER — PEN NEEDLES 30G X 8 MM MISC: 1.0000 | 0 refills | Status: DC | PRN

## 2021-10-03 NOTE — Telephone Encounter (Signed)
STAT if patient feels like he/she is going to faint   Are you dizzy now? no  Do you feel faint or have you passed out? no  Do you have any other symptoms? Patient is nausea, sob  Have you checked your HR and BP (record if available)? no   ?

## 2021-10-03 NOTE — Telephone Encounter (Signed)
Returned call to patient who states that she had a "dizzy spell" this morning. Pt states that she had been to the beauty shop and had to walk down a long ramp when leaving and she felt tired. By the time she arrived home she felt dizzy as if the room was spinning and she states she felt like she could fall. States she sat in her recliner immediately and the dizziness passed within 1-2 minutes, but it took her about 5 minutes to regain the ability to stand again. Pt denies LOC, vision changes, SOB, or weight gain. Attests to a 2lb weight loss since yesterday. Checked her vitals during episode once sitting: o2=92%, BP 106/74, CBG 149, states her 02 quickly came back up to 96%. Asked pt to send a manual transmission of her device into [employer] so that device team here can review due to hx of Paroxysmal VT and just to verify that this episode was not due to a rhythm change. Pt performed transmission while on phone. Pt rested, feels good at this time, no recent med changes, will await advice from MD.

## 2021-10-03 NOTE — Telephone Encounter (Signed)
RX request received from [facility] and sent.

## 2021-10-08 ENCOUNTER — Ambulatory Visit (INDEPENDENT_AMBULATORY_CARE_PROVIDER_SITE_OTHER): Payer: Medicare Other | Admitting: Pharmacist

## 2021-10-08 ENCOUNTER — Other Ambulatory Visit: Payer: Self-pay

## 2021-10-08 DIAGNOSIS — E119 Type 2 diabetes mellitus without complications: Secondary | ICD-10-CM | POA: Diagnosis not present

## 2021-10-08 DIAGNOSIS — E785 Hyperlipidemia, unspecified: Secondary | ICD-10-CM

## 2021-10-08 DIAGNOSIS — L602 Onychogryphosis: Secondary | ICD-10-CM | POA: Diagnosis not present

## 2021-10-08 DIAGNOSIS — L84 Corns and callosities: Secondary | ICD-10-CM | POA: Diagnosis not present

## 2021-10-08 DIAGNOSIS — I10 Essential (primary) hypertension: Secondary | ICD-10-CM

## 2021-10-08 DIAGNOSIS — E1169 Type 2 diabetes mellitus with other specified complication: Secondary | ICD-10-CM

## 2021-10-08 DIAGNOSIS — M79672 Pain in left foot: Secondary | ICD-10-CM | POA: Diagnosis not present

## 2021-10-08 DIAGNOSIS — M79671 Pain in right foot: Secondary | ICD-10-CM | POA: Diagnosis not present

## 2021-10-08 DIAGNOSIS — E118 Type 2 diabetes mellitus with unspecified complications: Secondary | ICD-10-CM

## 2021-10-08 NOTE — Telephone Encounter (Signed)
Patient scheduled for clinic visit 10/13/21 with Loel Dubonnet, NP on day Raeford Razor is in clinic.   Loel Dubonnet, NP

## 2021-10-08 NOTE — Progress Notes (Signed)
Chronic Care Management Pharmacy Note  10/09/2021 Name:  Tanya Hogan MRN:  [id number] DOB:  [birth year]  Summary: addressed HTN, HLD, and primarily DM. She had a recent ED visit for dehydration and vertigo.  BG values per CGM: 125-130s, and 140-150s after meals, one single high (180)  Recommendations/Changes made from today's visit:  Continue current regimen.   Cost assistance for ozempic for 2023 has been faxed and is pending update from company.  Plan: f/u with pharmacist in 3-5 months  Subjective: Tanya Hogan is an 71 y.o. year old female who is a primary patient of [name], Rene Kocher, MD.  The CCM team was consulted for assistance with disease management and care coordination needs.    Engaged with patient by telephone for follow up visit in response to provider referral for pharmacy case management and/or care coordination services.   Consent to Services:  The patient was given information about Chronic Care Management services, agreed to services, and gave verbal consent prior to initiation of services.  Please see initial visit note for detailed documentation.   Patient Care Team: Hali Marry, MD as PCP - General (Family Medicine) Freada Bergeron, MD as PCP - Cardiology (Cardiology) Marcy Panning, MD (Hematology and Oncology) Lelon Perla, MD as Attending Physician (Cardiology) Evans Lance, MD (Cardiology) Dr. Caprice Kluver (Otolaryngology) Amada Kingfisher, MD as Consulting Physician (Hematology and Oncology) Gerre Pebbles, MD as Referring Physician (Gastroenterology) Haynes Dage, OD as Referring Physician (Optometry) Darius Bump, [facility] as Pharmacist (Pharmacist)   Objective:  Lab Results  Component Value Date   CREATININE 1.12 (H) 09/23/2021   CREATININE 0.89 09/16/2021   CREATININE 0.94 04/16/2021    Lab Results  Component Value Date   HGBA1C 6.4 (A) 07/21/2021   Last diabetic Eye exam:  Lab Results  Component Value  Date/Time   HMDIABEYEEXA No Retinopathy 06/11/2021 12:00 AM    Last diabetic Foot exam: No results found for: HMDIABFOOTEX      Component Value Date/Time   CHOL 168 04/16/2021 0000   TRIG 120 04/16/2021 0000   HDL 73 04/16/2021 0000   CHOLHDL 2.3 04/16/2021 0000   VLDL 26 02/18/2017 0939   LDLCALC 74 04/16/2021 0000    Hepatic Function Latest Ref Rng & Units 04/16/2021 03/26/2020 10/20/2019  Total Protein 6.1 - 8.1 g/dL 7.1 6.6 6.4  Albumin 3.5 - 5.0 g/dL - - -  AST 10 - 35 U/L _0 ALT 6 - 29 U/L _1 Alk Phosphatase 38 - 126 U/L - - -  Total Bilirubin 0.2 - 1.2 mg/dL 0.4 0.4 0.3  Bilirubin, Direct 0.0 - 0.3 mg/dL - - -    Lab Results  Component Value Date/Time   TSH 2.54 08/14/2021 12:00 AM   TSH 5.96 (H) 07/03/2021 08:29 AM    CBC Latest Ref Rng & Units 04/16/2021 03/14/2019 12/28/2018  WBC 3.8 - 10.8 Thousand/uL 5.4 6.5 7.8  Hemoglobin 11.7 - 15.5 g/dL 12.2 14.1 15.0  Hematocrit 35.0 - 45.0 % 38.0 42.7 45.2(H)  Platelets 140 - 400 Thousand/uL 299 287 357    Lab Results  Component Value Date/Time   VD25OH 65 05/04/2014 02:27 PM   VD25OH 61 12/29/2010 08:05 AM    Clinical ASCVD: Yes  The 10-year ASCVD risk score (Arnett DK, et al., 2019) is: 29.1%   Values used to calculate the score:     Age: 75 years     Sex: Female  Is Non-Hispanic African American: No     Diabetic: Yes     Tobacco smoker: No     Systolic Blood Pressure: 253 mmHg     Is BP treated: Yes     HDL Cholesterol: 73 mg/dL     Total Cholesterol: 168 mg/dL      Social History   Tobacco Use  Smoking Status Former   Packs/day: 0.00   Years: 0.00   Pack years: 0.00   Types: Cigarettes   Quit date: 08/24/1966   Years since quitting: 55.1  Smokeless Tobacco Never   BP Readings from Last 3 Encounters:  09/29/21 136/86  09/26/21 (!) 97/59  09/23/21 (!) 109/56   Pulse Readings from Last 3 Encounters:  09/29/21 93  09/26/21 91  09/23/21 93   Wt Readings from Last 3 Encounters:   09/29/21 199 lb 14.4 oz (90.7 kg)  09/26/21 201 lb (91.2 kg)  09/23/21 202 lb (91.6 kg)    Assessment: Review of patient past medical history, allergies, medications, health status, including review of consultants reports, laboratory and other test data, was performed as part of comprehensive evaluation and provision of chronic care management services.   SDOH:  (Social Determinants of Health) assessments and interventions performed:    CCM Care Plan  Allergies  Allergen Reactions   Ferric Carboxymaltose Shortness Of Breath    Mild SOB following injectafer infusion Mild SOB following injectafer infusion    Penicillins Anaphylaxis    Has patient had a PCN reaction causing immediate rash, facial/tongue/throat swelling, SOB or lightheadedness with hypotension: Yes Has patient had a PCN reaction causing severe rash involving mucus membranes or skin necrosis: No Has patient had a PCN reaction that required hospitalization: No Has patient had a PCN reaction occurring within the last 10 years: No If all of the above answers are "NO", then may proceed with Cephalosporin use.    Accupril [Quinapril Hcl] Other (See Comments)    Hyperkalemia   Atorvastatin Other (See Comments)    msucle aches   Celebrex [Celecoxib]     ulcers   Fluticasone Other (See Comments)    Nasal ulcer.     Levocetirizine     Doesn't recall   Levomenol Other (See Comments)    unknown unknown    Losartan Potassium Itching   Naproxen Swelling   Nasonex [Mometasone Furoate] Other (See Comments)    nosebleed   Chamomile Other (See Comments)    unknown   Vesicare [Solifenacin] Other (See Comments)    "stopped me from urinating"   Aspirin Other (See Comments)    Pt states "burns holes in stomach", ulcers    Baking Soda-Fluoride [Sodium Fluoride] Itching   Clindamycin/Lincomycin Rash   Codeine Nausea And Vomiting   Furosemide Other (See Comments)    Nose bleed   Lactose    Lactose Intolerance (Gi) Other  (See Comments)    Increased calcium, sneezing, nasal issues   Lanolin-Petrolatum Rash   Metformin And Related Diarrhea   Miralax [Polyethylene Glycol 3350]    Miralax [Polyethylene Glycol] Rash   Nasonex [Mometasone Furoate] Other (See Comments)   Quinine Rash   Rifampin Other (See Comments)    DOES NOT REMEMBER THIS MEDICATION OR ALLERGY   Sulfa Antibiotics Rash   Sulfonamide Derivatives Rash   Tramadol Rash    Anxiety   Wellbutrin [Bupropion] Other (See Comments)    Muscle aches, pain, "made my skin crawl"   Wool Alcohol [Lanolin] Rash   Zoledronic Acid Other (See Comments) and  Rash    shaking    Medications Reviewed Today     Reviewed by Darius Bump, [facility] (Pharmacist) on 10/08/21 at 1355  Med List Status: <None>   Medication Order Taking? Sig Documenting Provider Last Dose Status Informant  albuterol (PROVENTIL) (2.5 MG/3ML) 0.083% nebulizer solution [id number] Yes Take 2.5 mg by nebulization every 6 (six) hours as needed for wheezing or shortness of breath. [provider] Taking Active   albuterol (VENTOLIN HFA) 108 (90 Base) MCG/ACT inhaler [id number] Yes Inhale 1-2 puffs into the lungs every 6 (six) hours as needed for wheezing or shortness of breath. Hali Marry, MD Taking Active   alendronate (FOSAMAX) 70 MG tablet [id number] Yes TAKE 1 TABLET BY MOUTH  EVERY 7 DAYS WITH A FULL  GLASS OF WATER ON AN EMPTY  STOMACH Hali Marry, MD Taking Active   ALLEGRA-D ALLERGY & CONGESTION 60-120 MG 12 hr tablet [id number] Yes  [provider] Taking [place] [id number] Yes Medication Name: Tubing and supplies for nebulizer. Fax - [phone] Hali Marry, MD Taking Active   amiodarone (PACERONE) 200 MG tablet [id number] Yes TAKE ONE-HALF TABLET BY  MOUTH DAILY Evans Lance, MD Taking Active   B Complex-C (B-COMPLEX WITH VITAMIN C) tablet [id number] Yes Take 1 tablet by mouth daily. [provider] Taking Active   bumetanide (BUMEX) 1 MG tablet [id number] Yes TAKE 1 TABLET BY MOUTH  TWICE DAILY Hali Marry, MD Taking Active   Calcium Citrate-Vitamin D (CITRACAL + D PO) [id number] Yes Take 1 tablet by mouth daily. [provider] Taking Active   carvedilol (COREG) 3.125 MG tablet [id number] Yes Take 1 tablet (3.125 mg total) by mouth 2 (two) times daily. Elgie Collard, PA-C Taking Active   Continuous Blood Gluc Receiver (FREESTYLE LIBRE 2 READER) [place] [id number] Yes by Does not apply route. [provider] Taking Active   Continuous Blood Gluc Sensor (FREESTYLE LIBRE 2 SENSOR) [place] [id number] Yes by Does not apply route. [provider] Taking Active   diltiazem (CARDIZEM CD) 180 MG 24 hr capsule [id number] Yes TAKE 1 CAPSULE BY MOUTH  DAILY Hali Marry, MD Taking Active   empagliflozin (JARDIANCE) 10 MG TABS tablet [id number] Yes Take 1 tablet (10 mg total) by mouth daily before breakfast. Hali Marry, MD Taking Active   fluticasone-salmeterol (ADVAIR [facility]) 778-24 MCG/ACT inhaler [id number]  Inhale 2 puffs into the lungs 2 (two) times daily. [provider]  Expired 02/25/21 2359   Insulin Pen Needle (PEN NEEDLES) 30G X 8 MM MISC [id number] Yes 1 Device by Does not apply route as needed. Hali Marry, MD Taking Active   levothyroxine (SYNTHROID) 88 MCG tablet [id number] Yes TAKE 1 TABLET(88 MCG) BY MOUTH DAILY Hali Marry, MD Taking Active   LINZESS 72 MCG capsule [id number] Yes Take 72 mcg by mouth 2 (two) times daily. [provider] Taking Active            Med Note Rikki Spearing Sep 15, 2021  2:54 PM) Instructed to take once daily per GI as of 09/15/21 review, patient may increase back to BID based on symptoms.  Multiple Vitamins-Minerals (OCUVITE EYE + MULTI) TABS [id number] Yes Take 1 tablet by mouth daily. [provider] Taking Active Self  pantoprazole (PROTONIX) 40 MG tablet [id number]  Yes Take 40 mg by mouth daily. [provider] Taking Active   potassium chloride (KLOR-CON M) 10 MEQ  tablet [id number] Yes Take 1 tablet (10 mEq total) by mouth daily. Hali Marry, MD Taking Active   pravastatin (PRAVACHOL) 40 MG tablet [id number] Yes TAKE 1 TABLET BY MOUTH AT  BEDTIME Hali Marry, MD Taking Active   Probiotic Product ([employer] PROBIOTIC) CAPS [id number] Yes  [provider] Taking Active   Semaglutide,0.25 or 0.5MG/DOS, (OZEMPIC, 0.25 OR 0.5 MG/DOSE,) 2 MG/1.5ML SOPN [id number] Yes Inject 0.25 mg into the skin. [provider] Taking Active            Med Note Rikki Spearing Aug 11, 2021  2:47 PM) Taking Saturdays  spironolactone (ALDACTONE) 25 MG tablet [id number] Yes Take 1 tablet (25 mg total) by mouth daily. Hali Marry, MD Taking Active            Med Note Bufford Buttner Sep 29, 2021 10:50 AM) Pt. Was instructed to hold until this appointment   Tiotropium Bromide Monohydrate 2.5 MCG/ACT AERS [id number]  Inhale 2 puffs into the lungs daily. [provider]  Expired 02/25/21 2359   valACYclovir (VALTREX) 1000 MG tablet [id number] Yes Take 1 tablet (1,000 mg total) by mouth 2 (two) times daily. Hali Marry, MD Taking Active             Patient Active Problem List   Diagnosis Date Noted   Osteoporosis 02/28/2021   Primary osteoarthritis of left knee 01/14/2021   Irritable bowel syndrome with constipation 09/16/2020   Aortic atherosclerosis ([place]) 06/11/2020   Benign lipomatous neoplasm of skin and subcutaneous tissue of left leg 06/11/2020   Paroxysmal VT 02/14/2020   Chronic constipation 10/06/2019   Moderate persistent asthma with exacerbation 08/29/2019   Genetic testing 08/14/2019   Ductal carcinoma in situ (DCIS) of right breast 06/15/2019   Arthralgia 04/11/2019   Malignant tumor of breast ([place]) 03/06/2019   Fatigue 03/06/2019   Status post total shoulder arthroplasty, right  03/10/2018   DDD (degenerative disc disease), cervical 11/19/2017   Bradycardia with less than 60 beats per minute 11/02/2017   Peripheral edema 05/19/2017   Microcytic anemia 05/14/2017   Delayed sleep phase syndrome 10/29/2016   Primary osteoarthritis of left ankle 09/24/2016   Gastrointestinal food sensitivity 05/21/2016   History of arthroplasty of right shoulder 02/24/2016   Bilateral foot pain 01/22/2016   NASH (nonalcoholic steatohepatitis) 05/26/2015    Class: Stage 3   Nasal septal perforation 07/11/2014   Controlled diabetes mellitus type 2 with complications ([place]) 93/81/8299   Physical deconditioning 04/24/2014   Obesity, Class II, BMI 35-39.9, with comorbidity 06/09/2013   OSA (obstructive sleep apnea) 37/16/9678   Chronic systolic heart failure ([place]) 05/23/2013   Asthma, moderate persistent 04/18/2013   Chronic cough 04/18/2013   Breast cancer of upper-inner quadrant of left female breast ([place]) 03/18/2010   Hyperlipidemia 11/07/2009   Fibromyalgia 10/29/2009   Complete heart block ([place]) s/p MDT BiVP 10/23/2009   PACEMAKER, PERMANENT 10/23/2009   ALKALINE PHOSPHATASE, ELEVATED 10/13/2009   Hypothyroidism 10/03/2009   Benign essential hypertension 07/02/2009   Chronic back pain 07/02/2009    Immunization History  Administered Date(s) Administered   Fluad Quad(high Dose 65+) 04/11/2019, 05/02/2020, 04/24/2021   Influenza Split 05/10/2012, 05/04/2014   Influenza Whole 06/14/2009, 05/08/2010, 04/24/2011   Influenza, High Dose Seasonal PF 05/17/2017, 05/24/2018, 04/11/2019, 05/02/2020   Influenza,inj,Quad PF,6+ Mos 04/28/2013, 05/04/2014, 05/06/2015, 05/18/2016   Moderna Sars-Covid-2 Vaccination 11/01/2019, 11/29/2019, 07/19/2020   Pneumococcal Conjugate-13 05/25/2014   Pneumococcal Polysaccharide-23 11/28/2012, 12/28/2018  Td 08/24/2004, 03/07/2010   Tdap 07/14/2012   Zoster Recombinat (Shingrix) 10/06/2017, 01/16/2020   Zoster, Live 04/24/2011    Conditions to  be addressed/monitored: HTN, HLD, and DMII  Care Plan : Medication Management  Updates made by Darius Bump, [place] since 10/09/2021 12:00 AM     Problem: DM, HTN, HLD      Long-Range Goal: Disease Progression Prevention   Start Date: 05/21/2021  Recent Progress: On track  Priority: High  Note:   Current Barriers:  Unable to independently afford treatment regimen Pt describes falling into donut hole after first 3 months of the year, then June or July into catastrophic coverage. Stressful process for her.  Pharmacist Clinical Goal(s):  Over the next 30 days, patient will verbalize ability to afford treatment regimen adhere to plan to optimize therapeutic regimen for diabetes as evidenced by report of adherence to recommended medication management changes through collaboration with PharmD and provider.   Interventions: 1:1 collaboration with Hali Marry, MD regarding development and update of comprehensive plan of care as evidenced by provider attestation and co-signature Inter-disciplinary care team collaboration (see longitudinal plan of care) Comprehensive medication review performed; medication list updated in electronic medical record  Diabetes:  Controlled; current treatment: ozempic 0.25 mg weekly, tolerating well and states she has lost 10-15lbs, and discontinued insulin!  Current glucose readings: per CGM, which she is loving:  Average BG last 7 days= 128, last 14 days = 123, last 30 days = 124.  Denies hypoglycemic/hyperglycemic symptoms  Current meal patterns: to be discussed at future visits  Current exercise: to be discussed at future visits  Counseled on goal BG values, answered pt questions about CGM vs traditional glucometer Recommend patient continue ozempic 0.91m weekly using sample, novonordisk application for cost assistance in progress for ozempic  Hypertension:   Controlled; current treatment:diltiazem 1854mdaily, ;   Current home readings: states  is "controlled" at home  Denies hypotensive/hypertensive symptoms  Recommended continue current regimen, and  Hyperlipidemia:  Controlled; current treatment:pravastatin 4066maily;   Recommended continue current regimen  Patient Goals/Self-Care Activities Over the next 90 days, patient will:  take medications as prescribed  Follow Up Plan: Telephone follow up appointment with care management team member scheduled for: 3-5 month        Medication Assistance:  Pending  [facility]  [id number] - [address]one: [phone]: [phone] Mail Service (OptCharltonA Lake Cityk[facility] CarFloresville0[address]one: [phone]: [phone] 1496ARKristopher OppenheimARMACY [id number] [place] [address]one: [phone]: [phone] Rivers Medical Centerlivery ([facility] ) - [place] [place]one: [phone]: [phone] pill box? Yes Pt endorses 100% compliance  Follow Up:  Patient agrees to Care Plan and Follow-up.  Plan: Telephone follow up appointment with care management team member scheduled for:  3-5 month  KeeLarinda ButteryharmD Clinical Pharmacist ConGreat [phone]

## 2021-10-08 NOTE — Telephone Encounter (Signed)
Spoke with patient 10/08/21 at this message was just routed today, reviewed transmission from 10/03/21, no VT episodes noted (patient had several episodes back in January) optivol on 10/03/21 remote showed possible fluid accumulation patient stated that her weight is down although she is still having problems with SOB with any activity at all informed her I would route this message to general cards, appointment scheduled for October 24 2021 at 9:30 with Dr. Lovena Le to discuss potential upgrade to Bi-V ICD due to VT last EF 20% 09/21/2021

## 2021-10-09 ENCOUNTER — Telehealth: Payer: Self-pay | Admitting: *Deleted

## 2021-10-09 ENCOUNTER — Other Ambulatory Visit: Payer: Self-pay | Admitting: Neurology

## 2021-10-09 MED ORDER — POTASSIUM CHLORIDE CRYS ER 10 MEQ PO TBCR: 10.0000 meq | EXTENDED_RELEASE_TABLET | Freq: Every day | ORAL | 0 refills | Status: DC

## 2021-10-09 NOTE — Chronic Care Management (AMB) (Signed)
°  Chronic Care Management   Note  10/09/2021 Name: AALIYA MAULTSBY MRN: [id number] DOB: 1950-10-03  [name] is a 72 y.o. year old female who is a primary care patient of Madilyn Fireman, Rene Kocher, MD. NECHUMA BOVEN is currently enrolled in care management services. An additional referral for Licensed Clinical SW was placed.   Follow up plan: Telephone appointment with care management team member scheduled for: 10/16/2021  Julian Hy, [place] [facility]  Direct Dial: [phone]

## 2021-10-09 NOTE — Patient Instructions (Signed)
Visit Information  Thank you for taking time to visit with me today. Please don't hesitate to contact me if I can be of assistance to you before our next scheduled telephone appointment.  Following are the goals we discussed today:  Patient Goals/Self-Care Activities Over the next 90 days, patient will:  take medications as prescribed  Follow Up Plan: Telephone follow up appointment with care management team member scheduled for: 3-5 month  Please call the care guide team at [phone] if you need to cancel or reschedule your appointment.    Patient verbalizes understanding of instructions and care plan provided today and agrees to view in [place]. Active MyChart status confirmed with patient.    Tanya Hogan

## 2021-10-12 NOTE — Progress Notes (Signed)
Office Visit    Patient Name: Tanya Hogan Date of Encounter: 09/29/2021  PCP:  Hali Marry, MD   [facility]  Cardiologist:  Cristopher Peru, MD  Advanced Practice Provider:  No care team member to display Electrophysiologist:  None    HPI    Tanya Hogan is a 71 y.o. female with a hx of chronic systolic heart failure and CHB status post biventricular PPM, depression, GERD, LBBB, diabetes mellitus without complication, hypertension, hypothyroidism presents today for follow-up.  The patient had breast cancer and was status post meniscectomy.  She has been hospital for CHF exacerbation early 2022.  She had an SVT was placed on amiodarone which she did not tolerate but does tolerate 100 mg daily.  She was last seen in the office April 2022 by Dr. Lovena Le at that time she was not having any angina.  Her left heart cath demonstrated no CAD.  Her EF was 35 to 40% by echocardiogram.  She remained on Coreg and amiodarone.  She had been intolerant to ACE inhibitor.  At that time no medication changes were made and she appeared euvolemic on exam.  Patient was recently in the ED over at [facility] on 09/12/2021.  Noted to be in decompensated heart failure with a BNP of 4700 and chest x-ray showing evidence of vascular congestion with bilateral pleural effusions.  She is started on Bumex twice daily with improvement of symptoms.  She initially was requiring 3 L of oxygen was able to be weaned down to room air.  Repeat echocardiogram showed EF of 20%.  Patient achieved euvolemia and was started on spironolactone and Jardiance at discharge.  She was last seen by me about two weeks ago and at that time she felt like she was being held down and had no energy. She had felt much better after discontinuing spirolactone. She continued to have weight loss since starting on Ozempic. She has not noticed any significant edema in her legs. She states she does try to maintain a  low-sodium, heart healthy diet however it is challenging because her diet is already restricted with her IBS. She also has many medication intolerances and allergies. She was willing however, to try Coreg at the lowest dose. She was also interested in participating in cardiac rehab as long as her insurance pays for it. We discussed getting her in to see a nutritionist as well which she seemed interested in.  Today, she continues to have dizziness/lightheadedness.  On Saturday she was walking around [employer] trying to get some steps and and felt fine during and after.  However, on Sunday she was down the entire day and felt dizzy and unwell.  She states that her back is extremely sore as well and she sees Dr. Lonia Mad for this she states she still having occasional shortness of breath.  Her weight has been stable.  We discussed stopping diltiazem since her EF is still low and this will also help bring up her blood pressure a bit.  Today her blood pressure was low normal and I encouraged her to take it at home and record those values.  I asked her to take her blood pressure at the same time every day.  She seems to be tolerating the Coreg that we started her on last time well.  We started her on the lowest dose 3.125.  We sent her a cardiac rehab referral last visit and she has not heard from anybody.  We  plan to reach out again to see if they are just really busy or what the hold up is. I think she would really benefit from rehab because I think at least some of her symptoms are deconditioning related.  She sees her primary care tomorrow and will likely get labs at that time.  Recommend a CMP to check liver and kidney function.  The only medication that might have a slight effect on her liver is the amiodarone but she is only on 100 mg a day which is a very low dose.  She also states that she is on BiPAP at night and occasionally wakes up short of breath and has to take her albuterol.  Asked her to discuss this with  her primary care since her BiPAP settings may need adjusting.  Reports no chest pain, pressure, or tightness. No edema, orthopnea, PND. Reports no palpitations.     Past Medical History    Past Medical History:  Diagnosis Date   Alkaline phosphatase elevation    Asthma    Breast cancer ([place]) 2011   Carpal tunnel syndrome    Carpal tunnel syndrome    bilateral   CHF (congestive heart failure) (HCC)    Cirrhosis, non-alcoholic (HCC)    Closed fracture of unspecified part of radius (alone)    DDD (degenerative disc disease), lumbar    Depression    pt denies   Diabetes mellitus without complication ([place])    Enlarged heart    Fibromyalgia    GERD (gastroesophageal reflux disease)    History of kidney stones    HTN (hypertension)    Hypothyroidism    IBS (irritable bowel syndrome)    LBBB (left bundle branch block)    Microcytic anemia 05/14/2017   Pneumonia    Presence of permanent cardiac pacemaker    PVD (peripheral vascular disease) ([place])    2019   Sleep apnea    uses cpap   Past Surgical History:  Procedure Laterality Date   BREAST LUMPECTOMY Left 2011   COLONOSCOPY     FOOT SURGERY     KNEE SURGERY     MASTECTOMY Right    05/2019   PACEMAKER GENERATOR CHANGE N/A 08/23/2014   Procedure: PACEMAKER GENERATOR CHANGE;  Surgeon: Evans Lance, MD;  Location: [facility] CATH LAB;  Service: Cardiovascular;  Laterality: N/A;   PACEMAKER PLACEMENT  2009   [facility]   RECTAL SURGERY  1976   REDUCTION MAMMAPLASTY Left    05/2019   TOTAL SHOULDER ARTHROPLASTY Right 03/10/2018   TOTAL SHOULDER ARTHROPLASTY Right 03/10/2018   Procedure: RIGHT TOTAL SHOULDER ARTHROPLASTY;  Surgeon: [name], MD;  Location: [employer] [facility];  Service: Orthopedics;  Laterality: Right;   TUBAL LIGATION      Allergies  Allergies  Allergen Reactions   Ferric Carboxymaltose Shortness Of Breath    Mild SOB following injectafer infusion Mild SOB following injectafer infusion    Penicillins  Anaphylaxis    Has patient had a PCN reaction causing immediate rash, facial/tongue/throat swelling, SOB or lightheadedness with hypotension: Yes Has patient had a PCN reaction causing severe rash involving mucus membranes or skin necrosis: No Has patient had a PCN reaction that required hospitalization: No Has patient had a PCN reaction occurring within the last 10 years: No If all of the above answers are "NO", then may proceed with Cephalosporin use.    Accupril [Quinapril Hcl] Other (See Comments)    Hyperkalemia   Atorvastatin Other (See Comments)    msucle  aches   Celebrex [Celecoxib]     ulcers   Fluticasone Other (See Comments)    Nasal ulcer.     Levocetirizine     Doesn't recall   Levomenol Other (See Comments)    unknown unknown    Losartan Potassium Itching   Naproxen Swelling   Nasonex [Mometasone Furoate] Other (See Comments)    nosebleed   Chamomile Other (See Comments)    unknown   Vesicare [Solifenacin] Other (See Comments)    "stopped me from urinating"   Aspirin Other (See Comments)    Pt states "burns holes in stomach", ulcers    Baking Soda-Fluoride [Sodium Fluoride] Itching   Clindamycin/Lincomycin Rash   Codeine Nausea And Vomiting   Furosemide Other (See Comments)    Nose bleed   Lactose    Lactose Intolerance (Gi) Other (See Comments)    Increased calcium, sneezing, nasal issues   Lanolin-Petrolatum Rash   Metformin And Related Diarrhea   Miralax [Polyethylene Glycol 3350]    Miralax [Polyethylene Glycol] Rash   Nasonex [Mometasone Furoate] Other (See Comments)   Quinine Rash   Rifampin Other (See Comments)    DOES NOT REMEMBER THIS MEDICATION OR ALLERGY   Sulfa Antibiotics Rash   Sulfonamide Derivatives Rash   Tramadol Rash    Anxiety   Wellbutrin [Bupropion] Other (See Comments)    Muscle aches, pain, "made my skin crawl"   Wool Alcohol [Lanolin] Rash   Zoledronic Acid Other (See Comments) and Rash    shaking     EKGs/Labs/Other  Studies Reviewed:   The following studies were reviewed today:  Echocardiogram 09/21/2021   The study was technically difficult.    Left Ventricle: Left ventricle is mildly dilated.Systolic function is  severely abnormal. EF: 20 %.    Right Ventricle: Right ventricle is mildly dilated. Systolic function  is moderately reduced. Abnormal tricuspid annular plane systolic excursion  (TAPSE) <1.7 cm.    Left Atrium: Left atrium is mildly dilated.    Mitral Valve: There is mild regurgitation with a centrally directed  jet.    Tricuspid Valve: The right ventricular systolic pressure is mildly  elevated (37-49 mmHg).   EKG:  EKG is not ordered today.   Recent Labs: 04/16/2021: ALT 18; Hemoglobin 12.2; Platelets 299 08/14/2021: TSH 2.54 09/23/2021: BUN 15; Creat 1.12; Potassium 4.2; Sodium 141  Recent Lipid Panel    Component Value Date/Time   CHOL 168 04/16/2021 0000   TRIG 120 04/16/2021 0000   HDL 73 04/16/2021 0000   CHOLHDL 2.3 04/16/2021 0000   VLDL 26 02/18/2017 0939   LDLCALC 74 04/16/2021 0000     Home Medications   Current Meds  Medication Sig   albuterol (PROVENTIL) (2.5 MG/3ML) 0.083% nebulizer solution Take 2.5 mg by nebulization every 6 (six) hours as needed for wheezing or shortness of breath.   albuterol (VENTOLIN HFA) 108 (90 Base) MCG/ACT inhaler Inhale 1-2 puffs into the lungs every 6 (six) hours as needed for wheezing or shortness of breath.   alendronate (FOSAMAX) 70 MG tablet TAKE 1 TABLET BY MOUTH  EVERY 7 DAYS WITH A FULL  GLASS OF WATER ON AN EMPTY  STOMACH   ALLEGRA-D ALLERGY & CONGESTION 60-120 MG 12 hr tablet    AMBULATORY NON FORMULARY MEDICATION Medication Name: Tubing and supplies for nebulizer. Fax - [phone]   amiodarone (PACERONE) 200 MG tablet TAKE ONE-HALF TABLET BY  MOUTH DAILY   B Complex-C (B-COMPLEX WITH VITAMIN C) tablet Take 1 tablet by mouth daily.  bumetanide (BUMEX) 1 MG tablet TAKE 1 TABLET BY MOUTH  TWICE DAILY   Calcium  Citrate-Vitamin D (CITRACAL + D PO) Take 1 tablet by mouth daily.   carvedilol (COREG) 3.125 MG tablet Take 1 tablet (3.125 mg total) by mouth 2 (two) times daily.   Continuous Blood Gluc Receiver (FREESTYLE LIBRE 2 READER) DEVI by Does not apply route.   Continuous Blood Gluc Sensor (FREESTYLE LIBRE 2 SENSOR) MISC by Does not apply route.   diltiazem (CARDIZEM CD) 180 MG 24 hr capsule TAKE 1 CAPSULE BY MOUTH  DAILY   levothyroxine (SYNTHROID) 88 MCG tablet TAKE 1 TABLET(88 MCG) BY MOUTH DAILY   LINZESS 72 MCG capsule Take 72 mcg by mouth 2 (two) times daily.   Multiple Vitamins-Minerals (OCUVITE EYE + MULTI) TABS Take 1 tablet by mouth daily.   pantoprazole (PROTONIX) 40 MG tablet Take 40 mg by mouth daily.   potassium chloride (KLOR-CON M) 10 MEQ tablet Take 1 tablet (10 mEq total) by mouth daily.   pravastatin (PRAVACHOL) 40 MG tablet TAKE 1 TABLET BY MOUTH AT  BEDTIME   Probiotic Product ([employer] PROBIOTIC) CAPS    Semaglutide,0.25 or 0.5MG/DOS, (OZEMPIC, 0.25 OR 0.5 MG/DOSE,) 2 MG/1.5ML SOPN Inject 0.25 mg into the skin.   valACYclovir (VALTREX) 1000 MG tablet Take 1 tablet (1,000 mg total) by mouth 2 (two) times daily.     Review of Systems      All other systems reviewed and are otherwise negative except as noted above.  Physical Exam    Vitals:   10/13/21 1114  BP: (!) 104/56  Pulse: 72  SpO2: 96%     Wt Readings from Last 3 Encounters:  09/29/21 199 lb 14.4 oz (90.7 kg)  09/26/21 201 lb (91.2 kg)  09/23/21 202 lb (91.6 kg)     GEN: Well nourished, well developed, in no acute distress. HEENT: normal. Neck: Supple, no JVD, carotid bruits, or masses. Cardiac: RRR, no murmurs, rubs, or gallops. No clubbing, cyanosis, edema.  Radials/PT 2+ and equal bilaterally.  Respiratory:  Respirations regular and unlabored, clear to auscultation bilaterally. GI: Soft, nontender, nondistended. MS: No deformity or atrophy. Skin: Warm and dry, no rash. Neuro:  Strength and sensation are  intact. Psych: Slightly depressed today  Assessment & Plan    Acute on chronic HFrEF -She appears euvolemic on exam today -She has stopped spironolactone due to fatigue and feels better since doing so -Recent Echo 09/21/2021, EF is 20% -Continue GDMT: Bumex 1 mg twice daily, Ozempic 0.25 mg injection, Coreg 3.122m -She is on a 50 oz fluid restriction  -Heart failure medications have been difficult to add due to a lot of medication intolerances and allergies. -Cardiac rehab referral sent again - Discontinue Diltiazem as not recommended in HFrEF  COPD -Continue follow-up with your pulmonologist  Hypertension -Low normal in the clinic, stop Diltiazem  -Continue to monitor your blood pressure at home  Diabetes mellitus type 2 -Recently started on Ozempic, Jardiance also prescribed -Blood glucose was well controlled on current regimen -Referral to nutritionist for assistance with diet for IBS, HF, and DM2. She states she doesn't have the money for this right now but is interested in pursuing in the future.  NSVT -Amio 1065mdaily -She has not had any recurrent symptoms -Primary checking LFTs tomorrow  Hypothyroidism -Continue to f/u with your PCP  7. SOB at night -BiPAP at night -settings need to be changed? She wakes up SOB and has to take her albuterol inhaler  Cardiac  Rehabilitation Eligibility Assessment   Cleared for cardiac rehab  Disposition: Follow up 2 weeks with Cristopher Peru, MD or APP.   Signed, Elgie Collard, PA-C 10/13/2021 [facility]

## 2021-10-13 ENCOUNTER — Ambulatory Visit (HOSPITAL_BASED_OUTPATIENT_CLINIC_OR_DEPARTMENT_OTHER): Payer: Medicare Other | Admitting: Physician Assistant

## 2021-10-13 ENCOUNTER — Encounter (HOSPITAL_BASED_OUTPATIENT_CLINIC_OR_DEPARTMENT_OTHER): Payer: Self-pay | Admitting: Physician Assistant

## 2021-10-13 ENCOUNTER — Telehealth: Payer: Self-pay | Admitting: Pharmacist

## 2021-10-13 ENCOUNTER — Other Ambulatory Visit: Payer: Self-pay

## 2021-10-13 VITALS — BP 104/56 | HR 72 | Ht 61.0 in | Wt 197.8 lb

## 2021-10-13 DIAGNOSIS — E119 Type 2 diabetes mellitus without complications: Secondary | ICD-10-CM | POA: Diagnosis not present

## 2021-10-13 DIAGNOSIS — R0602 Shortness of breath: Secondary | ICD-10-CM | POA: Diagnosis not present

## 2021-10-13 DIAGNOSIS — I10 Essential (primary) hypertension: Secondary | ICD-10-CM | POA: Diagnosis not present

## 2021-10-13 DIAGNOSIS — I4729 Other ventricular tachycardia: Secondary | ICD-10-CM

## 2021-10-13 DIAGNOSIS — I5020 Unspecified systolic (congestive) heart failure: Secondary | ICD-10-CM | POA: Diagnosis not present

## 2021-10-13 DIAGNOSIS — Z794 Long term (current) use of insulin: Secondary | ICD-10-CM

## 2021-10-13 DIAGNOSIS — J439 Emphysema, unspecified: Secondary | ICD-10-CM | POA: Diagnosis not present

## 2021-10-13 DIAGNOSIS — I5022 Chronic systolic (congestive) heart failure: Secondary | ICD-10-CM | POA: Diagnosis not present

## 2021-10-13 DIAGNOSIS — E038 Other specified hypothyroidism: Secondary | ICD-10-CM | POA: Diagnosis not present

## 2021-10-13 DIAGNOSIS — E118 Type 2 diabetes mellitus with unspecified complications: Secondary | ICD-10-CM | POA: Diagnosis not present

## 2021-10-13 NOTE — Patient Instructions (Signed)
Medication Instructions:  Your physician has recommended you make the following change in your medication:   DISCONTINUE: Diltiazem  *If you need a refill on your cardiac medications before your next appointment, please call your pharmacy*   Lab Work: None If you have labs (blood work) drawn today and your tests are completely normal, you will receive your results only by: [place] (if you have MyChart) OR A paper copy in the mail If you have any lab test that is abnormal or we need to change your treatment, we will call you to review the results.   Follow-Up: At [facility], you and your health needs are our priority.  As part of our continuing mission to provide you with exceptional heart care, we have created designated Provider Care Teams.  These Care Teams include your primary Cardiologist (physician) and Advanced Practice Providers (APPs -  Physician Assistants and Nurse Practitioners) who all work together to provide you with the care you need, when you need it.  We recommend signing up for the patient portal called "MyChart".  Sign up information is provided on this After Visit Summary.  MyChart is used to connect with patients for Virtual Visits (Telemedicine).  Patients are able to view lab/test results, encounter notes, upcoming appointments, etc.  Non-urgent messages can be sent to your provider as well.   To learn more about what you can do with MyChart, go to [email or web address].    Your next appointment:   As scheduled :1}    Other Instructions Keep a Blood Pressure log with Heart Rate for 2 weeks and bring to your next appointment.

## 2021-10-13 NOTE — Progress Notes (Signed)
I have contacted [employer] about the update on Ozempic patient assistance application. They have received the application but it cannot be processed due to the missing information on the application. It will need to have the patients names and address on the application and it can be refax to [phone].  Corrie Mckusick, [place]

## 2021-10-14 ENCOUNTER — Ambulatory Visit (INDEPENDENT_AMBULATORY_CARE_PROVIDER_SITE_OTHER): Payer: Medicare Other | Admitting: Family Medicine

## 2021-10-14 ENCOUNTER — Telehealth: Payer: Self-pay | Admitting: Family Medicine

## 2021-10-14 ENCOUNTER — Encounter: Payer: Self-pay | Admitting: Family Medicine

## 2021-10-14 VITALS — BP 111/67 | HR 72 | Resp 16 | Ht 61.0 in | Wt 197.0 lb

## 2021-10-14 DIAGNOSIS — I5022 Chronic systolic (congestive) heart failure: Secondary | ICD-10-CM | POA: Diagnosis not present

## 2021-10-14 DIAGNOSIS — E118 Type 2 diabetes mellitus with unspecified complications: Secondary | ICD-10-CM

## 2021-10-14 DIAGNOSIS — Z6837 Body mass index (BMI) 37.0-37.9, adult: Secondary | ICD-10-CM

## 2021-10-14 NOTE — Assessment & Plan Note (Signed)
She feels like overall she is doing really well on her regimen.  She has lost a fair amount of weight and is actually under 200 pounds now.  She is down about 9 pounds since I last saw her.  She does feel like she is plateaued a little bit in the last 2 weeks.  But I wanted to just continue with her current regimen.  Would not can make any changes today.  You to work on good food choices she has noticed a big drop in her appetite.

## 2021-10-14 NOTE — Assessment & Plan Note (Signed)
She is actually doing really well on her current regimen she would really like to be able to go up on the [place] but right now are still waiting to see if we can get assistance through the drug manufacture we did give her 1 more week sample pens today.

## 2021-10-14 NOTE — Patient Instructions (Signed)
Stop your diltiazem and restart your carvedilol.

## 2021-10-14 NOTE — Telephone Encounter (Signed)
Please call advanced home care I think it is now [facility] and see if we can get a download from her BiPAP.  See if they can also let us know what the settings are.

## 2021-10-14 NOTE — Assessment & Plan Note (Signed)
Unfortunately there was a little bit of confusion about her medication she thought she was post to hold her carvedilol instead of the diltiazem.  But we reviewed that carefully today and actually printed out the cardiology note for her.  The change was just made yesterday so she really had not even had a chance to implement it.  But again we will hold the diltiazem.  In fact good off of her medication list completely.  And reassured her that she is to take her carvedilol.  I be interested to see if her blood pressure comes up a little bit if she also feels a little bit better in regards to energy and not feeling lightheaded.

## 2021-10-14 NOTE — Progress Notes (Signed)
Established Patient Office Visit  Subjective:  Patient ID: Tanya Hogan, female    DOB: 1951-07-18  Age: 71 y.o. MRN: [id number]  CC:  Chief Complaint  Patient presents with   Follow-up    2 week for weight and BP check     HPI Tanya Hogan presents for   Follow-up of chronic systolic heart failure-she just saw cardiology yesterday.  She was unable to the spironolactone and we had actually discontinued that when I saw her last time.  Recommended that she continue Bumex 1 mg twice daily, Ozempic 2.5 mg injection and Coreg 3.125 mg.  They did discontinue her diltiazem.  They have also referred her for cardiac rehab.  She just feels like she has no energy she runs out of steam and she still having a lot of pain in her upper back.  She is still struggling with her BiPAP which is prescribed by Dr. Mariea Clonts.  She says she will wake up in the middle the night and feel like it is really just completely blowing her away and then will have to take it off and then try to put it back on and then it is more difficult to fall back asleep.  She has noticed a significant decrease in her appetite on Ozempic.  She says she feels like she is only eating about a third of what she was previously eating.  She is lost about 9 pounds since I last saw her.  Past Medical History:  Diagnosis Date   Alkaline phosphatase elevation    Asthma    Breast cancer ([place]) 2011   Carpal tunnel syndrome    Carpal tunnel syndrome    bilateral   CHF (congestive heart failure) (HCC)    Cirrhosis, non-alcoholic (HCC)    Closed fracture of unspecified part of radius (alone)    DDD (degenerative disc disease), lumbar    Depression    pt denies   Diabetes mellitus without complication ([place])    Enlarged heart    Fibromyalgia    GERD (gastroesophageal reflux disease)    History of kidney stones    HTN (hypertension)    Hypothyroidism    IBS (irritable bowel syndrome)    LBBB (left bundle branch block)    Microcytic  anemia 05/14/2017   Pneumonia    Presence of permanent cardiac pacemaker    PVD (peripheral vascular disease) ([place])    2019   Sleep apnea    uses cpap    Past Surgical History:  Procedure Laterality Date   BREAST LUMPECTOMY Left 2011   COLONOSCOPY     FOOT SURGERY     KNEE SURGERY     MASTECTOMY Right    05/2019   PACEMAKER GENERATOR CHANGE N/A 08/23/2014   Procedure: PACEMAKER GENERATOR CHANGE;  Surgeon: Evans Lance, MD;  Location: [facility] CATH LAB;  Service: Cardiovascular;  Laterality: N/A;   PACEMAKER PLACEMENT  2009   [facility]   RECTAL SURGERY  1976   REDUCTION MAMMAPLASTY Left    05/2019   TOTAL SHOULDER ARTHROPLASTY Right 03/10/2018   TOTAL SHOULDER ARTHROPLASTY Right 03/10/2018   Procedure: RIGHT TOTAL SHOULDER ARTHROPLASTY;  Surgeon: [name], MD;  Location: Home;  Service: Orthopedics;  Laterality: Right;   TUBAL LIGATION      Family History  Problem Relation Age of Onset   Heart disease Mother    Melanoma Mother    Parkinson's disease Mother    Heart disease Father    Heart  disease Brother    Heart failure Other    Breast cancer Other    Hypertension Other     Social History   Socioeconomic History   Marital status: Divorced    Spouse name: Not on file   Number of children: 2   Years of education: 16   Highest education level: Associate degree: academic program  Occupational History   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 Lives with and helps her brother. Likes Systems developer, Social worker and  music. Loves animals.   Social Determinants of Health   Financial Resource Strain: Low Risk    Difficulty of Paying Living Expenses: Not hard at all  Food Insecurity: No Food Insecurity   Worried About Charity fundraiser in the Last Year: Never true   [place] in the Last Year: Never true  Transportation Needs: No Transportation Needs   Lack of Transportation (Medical): No   Lack of Transportation (Non-Medical): No  Physical Activity: Inactive   Days of Exercise per Week: 0 days   Minutes of Exercise per Session: 0 min  Stress: No Stress Concern Present   Feeling of Stress : Not at all  Social Connections: Socially Isolated   Frequency of Communication with Friends and Family: More than three times a week   Frequency of Social Gatherings with Friends and Family: Once a week   Attends Religious Services: Never   Marine scientist or Organizations: No   Attends Music therapist: Never   Marital Status: Divorced  Human resources officer Violence: Not At Risk   Fear of Current or Ex-Partner: No   Emotionally Abused: No   Physically Abused: No   Sexually Abused: No    Outpatient Medications Prior to Visit  Medication Sig Dispense Refill   albuterol (PROVENTIL) (2.5 MG/3ML) 0.083% nebulizer solution Take 2.5 mg by nebulization every 6 (six) hours as needed for wheezing or shortness of breath.     albuterol (VENTOLIN HFA) 108 (90 Base) MCG/ACT inhaler Inhale 1-2 puffs into the lungs every 6 (six) hours as needed for wheezing or shortness of breath. 54 g 3   alendronate (FOSAMAX) 70 MG tablet TAKE 1 TABLET BY MOUTH  EVERY 7 DAYS WITH A FULL  GLASS OF WATER ON AN EMPTY  STOMACH 12 tablet 4   ALLEGRA-D ALLERGY & CONGESTION 60-120 MG 12 hr tablet      AMBULATORY NON FORMULARY MEDICATION Medication Name: Tubing and supplies for nebulizer. Fax - [phone] 1 vial 0   amiodarone (PACERONE) 200 MG tablet TAKE ONE-HALF TABLET BY  MOUTH DAILY 45 tablet 2   B Complex-C  (B-COMPLEX WITH VITAMIN C) tablet Take 1 tablet by mouth daily.     bumetanide (BUMEX) 1 MG tablet TAKE 1 TABLET BY MOUTH  TWICE DAILY 180 tablet 0   Calcium Citrate-Vitamin D (CITRACAL + D PO) Take 1 tablet by mouth daily.  Continuous Blood Gluc Receiver (FREESTYLE LIBRE 2 READER) DEVI by Does not apply route.     Continuous Blood Gluc Sensor (FREESTYLE LIBRE 2 SENSOR) MISC by Does not apply route.     empagliflozin (JARDIANCE) 10 MG TABS tablet Take 1 tablet (10 mg total) by mouth daily before breakfast. 30 tablet 1   Insulin Pen Needle (PEN NEEDLES) 30G X 8 MM MISC 1 Device by Does not apply route as needed. 100 each 0   levothyroxine (SYNTHROID) 88 MCG tablet TAKE 1 TABLET(88 MCG) BY MOUTH DAILY 90 tablet 0   LINZESS 72 MCG capsule Take 72 mcg by mouth 2 (two) times daily.     Multiple Vitamins-Minerals (OCUVITE EYE + MULTI) TABS Take 1 tablet by mouth daily.     pantoprazole (PROTONIX) 40 MG tablet Take 40 mg by mouth daily.     potassium chloride (KLOR-CON M) 10 MEQ tablet Take 1 tablet (10 mEq total) by mouth daily. 90 tablet 0   pravastatin (PRAVACHOL) 40 MG tablet TAKE 1 TABLET BY MOUTH AT  BEDTIME 90 tablet 3   Probiotic Product ([employer] PROBIOTIC) CAPS      Semaglutide,0.25 or 0.5MG/DOS, (OZEMPIC, 0.25 OR 0.5 MG/DOSE,) 2 MG/1.5ML SOPN Inject 0.25 mg into the skin.     valACYclovir (VALTREX) 1000 MG tablet Take 1 tablet (1,000 mg total) by mouth 2 (two) times daily. 14 tablet 2   carvedilol (COREG) 3.125 MG tablet Take 1 tablet (3.125 mg total) by mouth 2 (two) times daily. 180 tablet 3   fluticasone-salmeterol (ADVAIR HFA) 115-21 MCG/ACT inhaler Inhale 2 puffs into the lungs 2 (two) times daily.     Tiotropium Bromide Monohydrate 2.5 MCG/ACT AERS Inhale 2 puffs into the lungs daily.     No facility-administered medications prior to visit.    Allergies  Allergen Reactions   Ferric Carboxymaltose Shortness Of Breath    Mild SOB following injectafer infusion Mild SOB following  injectafer infusion    Penicillins Anaphylaxis    Has patient had a PCN reaction causing immediate rash, facial/tongue/throat swelling, SOB or lightheadedness with hypotension: Yes Has patient had a PCN reaction causing severe rash involving mucus membranes or skin necrosis: No Has patient had a PCN reaction that required hospitalization: No Has patient had a PCN reaction occurring within the last 10 years: No If all of the above answers are "NO", then may proceed with Cephalosporin use.    Accupril [Quinapril Hcl] Other (See Comments)    Hyperkalemia   Atorvastatin Other (See Comments)    msucle aches   Celebrex [Celecoxib]     ulcers   Fluticasone Other (See Comments)    Nasal ulcer.     Levocetirizine     Doesn't recall   Levomenol Other (See Comments)    unknown unknown    Losartan Potassium Itching   Naproxen Swelling   Nasonex [Mometasone Furoate] Other (See Comments)    nosebleed   Chamomile Other (See Comments)    unknown   Vesicare [Solifenacin] Other (See Comments)    "stopped me from urinating"   Aspirin Other (See Comments)    Pt states "burns holes in stomach", ulcers    Baking Soda-Fluoride [Sodium Fluoride] Itching   Clindamycin/Lincomycin Rash   Codeine Nausea And Vomiting   Furosemide Other (See Comments)    Nose bleed   Lactose    Lactose Intolerance (Gi) Other (See Comments)    Increased calcium, sneezing, nasal issues   Lanolin-Petrolatum Rash   Metformin And Related Diarrhea  Miralax [Polyethylene Glycol 3350]    Miralax [Polyethylene Glycol] Rash   Nasonex [Mometasone Furoate] Other (See Comments)   Quinine Rash   Rifampin Other (See Comments)    DOES NOT REMEMBER THIS MEDICATION OR ALLERGY   Sulfa Antibiotics Rash   Sulfonamide Derivatives Rash   Tramadol Rash    Anxiety   Wellbutrin [Bupropion] Other (See Comments)    Muscle aches, pain, "made my skin crawl"   Wool Alcohol [Lanolin] Rash   Zoledronic Acid Other (See Comments) and Rash     shaking    ROS Review of Systems    Objective:    Physical Exam Constitutional:      Appearance: Normal appearance. She is well-developed.  HENT:     Head: Normocephalic and atraumatic.  Cardiovascular:     Rate and Rhythm: Normal rate and regular rhythm.     Heart sounds: Normal heart sounds.  Pulmonary:     Effort: Pulmonary effort is normal.     Breath sounds: Normal breath sounds.  Musculoskeletal:        General: No swelling.  Skin:    General: Skin is warm and dry.  Neurological:     Mental Status: She is alert and oriented to person, place, and time.  Psychiatric:        Behavior: Behavior normal.    BP 111/67    Pulse 72    Resp 16    Ht _0  (1.549 m)    Wt 197 lb (89.4 kg)    SpO2 97%    BMI 37.22 kg/m  Wt Readings from Last 3 Encounters:  10/14/21 197 lb (89.4 kg)  10/13/21 197 lb 12.8 oz (89.7 kg)  09/29/21 199 lb 14.4 oz (90.7 kg)     Health Maintenance Due  Topic Date Due   COVID-19 Vaccine (4 - Booster for Moderna series) 09/13/2020    There are no preventive care reminders to display for this patient.  Lab Results  Component Value Date   TSH 2.54 08/14/2021   Lab Results  Component Value Date   WBC 5.4 04/16/2021   HGB 12.2 04/16/2021   HCT 38.0 04/16/2021   MCV 90.9 04/16/2021   PLT 299 04/16/2021   Lab Results  Component Value Date   NA 141 09/23/2021   K 4.2 09/23/2021   CHLORIDE 105 10/30/2014   CO2 26 09/23/2021   GLUCOSE 156 (H) 09/23/2021   BUN 15 09/23/2021   CREATININE 1.12 (H) 09/23/2021   BILITOT 0.4 04/16/2021   ALKPHOS 100 03/02/2018   AST 22 04/16/2021   ALT 18 04/16/2021   PROT 7.1 04/16/2021   ALBUMIN 4.0 03/02/2018   CALCIUM 9.7 09/23/2021   ANIONGAP 8 03/11/2018   EGFR 52 (L) 09/23/2021   Lab Results  Component Value Date   CHOL 168 04/16/2021   Lab Results  Component Value Date   HDL 73 04/16/2021   Lab Results  Component Value Date   LDLCALC 74 04/16/2021   Lab Results  Component Value  Date   TRIG 120 04/16/2021   Lab Results  Component Value Date   CHOLHDL 2.3 04/16/2021   Lab Results  Component Value Date   HGBA1C 6.4 (A) 07/21/2021      Assessment & Plan:   Problem List Items Addressed This Visit       Cardiovascular and Mediastinum   Chronic systolic heart failure ([place]) - Primary    Unfortunately there was a little bit of confusion about her medication she  thought she was post to hold her carvedilol instead of the diltiazem.  But we reviewed that carefully today and actually printed out the cardiology note for her.  The change was just made yesterday so she really had not even had a chance to implement it.  But again we will hold the diltiazem.  In fact good off of her medication list completely.  And reassured her that she is to take her carvedilol.  I be interested to see if her blood pressure comes up a little bit if she also feels a little bit better in regards to energy and not feeling lightheaded.        Endocrine   Controlled diabetes mellitus type 2 with complications ([place])    She is actually doing really well on her current regimen she would really like to be able to go up on the [place] but right now are still waiting to see if we can get assistance through the drug manufacture we did give her 1 more week sample pens today.        Other   BMI 37.0-37.9, adult    She feels like overall she is doing really well on her regimen.  She has lost a fair amount of weight and is actually under 200 pounds now.  She is down about 9 pounds since I last saw her.  She does feel like she is plateaued a little bit in the last 2 weeks.  But I wanted to just continue with her current regimen.  Would not can make any changes today.  You to work on good food choices she has noticed a big drop in her appetite.       No orders of the defined types were placed in this encounter.   Follow-up: Return in about 3 months (around 01/11/2022) for Diabetes follow-up.     Beatrice Lecher, MD

## 2021-10-15 ENCOUNTER — Telehealth: Payer: Self-pay

## 2021-10-15 DIAGNOSIS — E876 Hypokalemia: Secondary | ICD-10-CM

## 2021-10-15 MED ORDER — POTASSIUM CHLORIDE CRYS ER 10 MEQ PO TBCR
10.0000 meq | EXTENDED_RELEASE_TABLET | Freq: Every day | ORAL | 0 refills | Status: DC
Start: 2021-10-15 — End: 2021-10-27

## 2021-10-15 NOTE — Telephone Encounter (Signed)
Patient advised.

## 2021-10-15 NOTE — Telephone Encounter (Signed)
Tanya Hogan called and states she is worried about taking potassium daily. She states 20 years ago she had elevated potassium. She is not sure it is same to take it daily. Please advise.

## 2021-10-15 NOTE — Telephone Encounter (Signed)
We would just need to monitor her for carefully.  So in other words she would take it daily for about 10 days and then we would recheck her potassium to make sure its not going too high.  If it looks good then we will recheck it again in a month and if it still looks good then we will recheck it again in 3 months and so on.

## 2021-10-16 ENCOUNTER — Ambulatory Visit: Payer: Medicare Other | Admitting: *Deleted

## 2021-10-16 DIAGNOSIS — R0602 Shortness of breath: Secondary | ICD-10-CM

## 2021-10-16 DIAGNOSIS — E118 Type 2 diabetes mellitus with unspecified complications: Secondary | ICD-10-CM

## 2021-10-16 DIAGNOSIS — M545 Low back pain, unspecified: Secondary | ICD-10-CM

## 2021-10-16 DIAGNOSIS — I5022 Chronic systolic (congestive) heart failure: Secondary | ICD-10-CM

## 2021-10-16 DIAGNOSIS — R5381 Other malaise: Secondary | ICD-10-CM

## 2021-10-16 DIAGNOSIS — E1169 Type 2 diabetes mellitus with other specified complication: Secondary | ICD-10-CM

## 2021-10-16 DIAGNOSIS — R001 Bradycardia, unspecified: Secondary | ICD-10-CM

## 2021-10-16 DIAGNOSIS — Z950 Presence of cardiac pacemaker: Secondary | ICD-10-CM

## 2021-10-16 DIAGNOSIS — M5030 Other cervical disc degeneration, unspecified cervical region: Secondary | ICD-10-CM

## 2021-10-16 DIAGNOSIS — M19072 Primary osteoarthritis, left ankle and foot: Secondary | ICD-10-CM

## 2021-10-16 DIAGNOSIS — E785 Hyperlipidemia, unspecified: Secondary | ICD-10-CM

## 2021-10-16 DIAGNOSIS — G8929 Other chronic pain: Secondary | ICD-10-CM

## 2021-10-16 DIAGNOSIS — E7800 Pure hypercholesterolemia, unspecified: Secondary | ICD-10-CM

## 2021-10-16 DIAGNOSIS — D509 Iron deficiency anemia, unspecified: Secondary | ICD-10-CM

## 2021-10-16 DIAGNOSIS — R42 Dizziness and giddiness: Secondary | ICD-10-CM

## 2021-10-16 DIAGNOSIS — M797 Fibromyalgia: Secondary | ICD-10-CM

## 2021-10-17 ENCOUNTER — Encounter: Payer: Self-pay | Admitting: *Deleted

## 2021-10-17 NOTE — Telephone Encounter (Signed)
[place] at [phone] at spoke with Melody. Per Melody last updated information for patient BP information/setting was 02/27/2020. Min pressure 8 and Max 20. Forward to Dr. Deberah [place].

## 2021-10-17 NOTE — Chronic Care Management (AMB) (Signed)
Chronic Care Management    Clinical Social Work Note  10/17/2021 Name: Tanya Hogan MRN: [id number] DOB: 1951/07/11  Tanya Hogan is a 71 y.o. year old female who is a primary care patient of [name], Tanya Kocher, MD. The CCM team was consulted to assist the patient with chronic disease management and/or care coordination needs related to: [employer] and Financial Difficulties.   Engaged with patient by telephone for initial visit in response to provider referral for social work chronic care management and care coordination services.   Consent to Services:  The patient was given information about Chronic Care Management services, agreed to services, and gave verbal consent prior to initiation of services.  Please see initial visit note for detailed documentation.   Patient agreed to services and consent obtained.   Assessment: Review of patient past medical history, allergies, medications, and health status, including review of relevant consultants reports was performed today as part of a comprehensive evaluation and provision of chronic care management and care coordination services.     SDOH (Social Determinants of Health) assessments and interventions performed:    Advanced Directives Status: Not ready or willing to discuss.  CCM Care Plan  Allergies  Allergen Reactions   Ferric Carboxymaltose Shortness Of Breath    Mild SOB following injectafer infusion Mild SOB following injectafer infusion    Penicillins Anaphylaxis    Has patient had a PCN reaction causing immediate rash, facial/tongue/throat swelling, SOB or lightheadedness with hypotension: Yes Has patient had a PCN reaction causing severe rash involving mucus membranes or skin necrosis: No Has patient had a PCN reaction that required hospitalization: No Has patient had a PCN reaction occurring within the last 10 years: No If all of the above answers are "NO", then may proceed with Cephalosporin use.     Accupril [Quinapril Hcl] Other (See Comments)    Hyperkalemia   Atorvastatin Other (See Comments)    msucle aches   Celebrex [Celecoxib]     ulcers   Fluticasone Other (See Comments)    Nasal ulcer.     Levocetirizine     Doesn't recall   Levomenol Other (See Comments)    unknown unknown    Losartan Potassium Itching   Naproxen Swelling   Nasonex [Mometasone Furoate] Other (See Comments)    nosebleed   Chamomile Other (See Comments)    unknown   Vesicare [Solifenacin] Other (See Comments)    "stopped me from urinating"   Aspirin Other (See Comments)    Pt states "burns holes in stomach", ulcers    Baking Soda-Fluoride [Sodium Fluoride] Itching   Clindamycin/Lincomycin Rash   Codeine Nausea And Vomiting   Furosemide Other (See Comments)    Nose bleed   Lactose    Lactose Intolerance (Gi) Other (See Comments)    Increased calcium, sneezing, nasal issues   Lanolin-Petrolatum Rash   Metformin And Related Diarrhea   Miralax [Polyethylene Glycol 3350]    Miralax [Polyethylene Glycol] Rash   Nasonex [Mometasone Furoate] Other (See Comments)   Quinine Rash   Rifampin Other (See Comments)    DOES NOT REMEMBER THIS MEDICATION OR ALLERGY   Sulfa Antibiotics Rash   Sulfonamide Derivatives Rash   Tramadol Rash    Anxiety   Wellbutrin [Bupropion] Other (See Comments)    Muscle aches, pain, "made my skin crawl"   Wool Alcohol [Lanolin] Rash   Zoledronic Acid Other (See Comments) and Rash    shaking    Outpatient Encounter Medications as of 10/16/2021  Medication Sig   albuterol (PROVENTIL) (2.5 MG/3ML) 0.083% nebulizer solution Take 2.5 mg by nebulization every 6 (six) hours as needed for wheezing or shortness of breath.   albuterol (VENTOLIN HFA) 108 (90 Base) MCG/ACT inhaler Inhale 1-2 puffs into the lungs every 6 (six) hours as needed for wheezing or shortness of breath.   alendronate (FOSAMAX) 70 MG tablet TAKE 1 TABLET BY MOUTH  EVERY 7 DAYS WITH A FULL  GLASS OF WATER ON  AN EMPTY  STOMACH   ALLEGRA-D ALLERGY & CONGESTION 60-120 MG 12 hr tablet    AMBULATORY NON FORMULARY MEDICATION Medication Name: Tubing and supplies for nebulizer. Fax - [phone]   amiodarone (PACERONE) 200 MG tablet TAKE ONE-HALF TABLET BY  MOUTH DAILY   B Complex-C (B-COMPLEX WITH VITAMIN C) tablet Take 1 tablet by mouth daily.   bumetanide (BUMEX) 1 MG tablet TAKE 1 TABLET BY MOUTH  TWICE DAILY   Calcium Citrate-Vitamin D (CITRACAL + D PO) Take 1 tablet by mouth daily.   carvedilol (COREG) 3.125 MG tablet Take 1 tablet (3.125 mg total) by mouth 2 (two) times daily.   Continuous Blood Gluc Receiver (FREESTYLE LIBRE 2 READER) DEVI by Does not apply route.   Continuous Blood Gluc Sensor (FREESTYLE LIBRE 2 SENSOR) MISC by Does not apply route.   empagliflozin (JARDIANCE) 10 MG TABS tablet Take 1 tablet (10 mg total) by mouth daily before breakfast.   fluticasone-salmeterol (ADVAIR HFA) 115-21 MCG/ACT inhaler Inhale 2 puffs into the lungs 2 (two) times daily.   Insulin Pen Needle (PEN NEEDLES) 30G X 8 MM MISC 1 Device by Does not apply route as needed.   levothyroxine (SYNTHROID) 88 MCG tablet TAKE 1 TABLET(88 MCG) BY MOUTH DAILY   LINZESS 72 MCG capsule Take 72 mcg by mouth 2 (two) times daily.   Multiple Vitamins-Minerals (OCUVITE EYE + MULTI) TABS Take 1 tablet by mouth daily.   pantoprazole (PROTONIX) 40 MG tablet Take 40 mg by mouth daily.   potassium chloride (KLOR-CON M) 10 MEQ tablet Take 1 tablet (10 mEq total) by mouth daily.   pravastatin (PRAVACHOL) 40 MG tablet TAKE 1 TABLET BY MOUTH AT  BEDTIME   Probiotic Product ([employer] PROBIOTIC) CAPS    Semaglutide,0.25 or 0.5MG/DOS, (OZEMPIC, 0.25 OR 0.5 MG/DOSE,) 2 MG/1.5ML SOPN Inject 0.25 mg into the skin.   Tiotropium Bromide Monohydrate 2.5 MCG/ACT AERS Inhale 2 puffs into the lungs daily.   valACYclovir (VALTREX) 1000 MG tablet Take 1 tablet (1,000 mg total) by mouth 2 (two) times daily.   No facility-administered encounter  medications on file as of 10/16/2021.    Patient Active Problem List   Diagnosis Date Noted   BMI 37.0-37.9, adult 10/14/2021   Osteoporosis 02/28/2021   Primary osteoarthritis of left knee 01/14/2021   Irritable bowel syndrome with constipation 09/16/2020   Aortic atherosclerosis ([place]) 06/11/2020   Benign lipomatous neoplasm of skin and subcutaneous tissue of left leg 06/11/2020   Paroxysmal VT 02/14/2020   Chronic constipation 10/06/2019   Moderate persistent asthma with exacerbation 08/29/2019   Genetic testing 08/14/2019   Ductal carcinoma in situ (DCIS) of right breast 06/15/2019   Arthralgia 04/11/2019   Malignant tumor of breast ([place]) 03/06/2019   Fatigue 03/06/2019   Status post total shoulder arthroplasty, right 03/10/2018   DDD (degenerative disc disease), cervical 11/19/2017   Bradycardia with less than 60 beats per minute 11/02/2017   Peripheral edema 05/19/2017   Microcytic anemia 05/14/2017   Delayed sleep phase syndrome 10/29/2016   Primary  osteoarthritis of left ankle 09/24/2016   Gastrointestinal food sensitivity 05/21/2016   History of arthroplasty of right shoulder 02/24/2016   Bilateral foot pain 01/22/2016   NASH (nonalcoholic steatohepatitis) 05/26/2015    Class: Stage 3   Nasal septal perforation 07/11/2014   Controlled diabetes mellitus type 2 with complications ([place]) 59/56/3875   Physical deconditioning 04/24/2014   Obesity, Class II, BMI 35-39.9, with comorbidity 06/09/2013   OSA (obstructive sleep apnea) 64/33/2951   Chronic systolic heart failure ([place]) 05/23/2013   Asthma, moderate persistent 04/18/2013   Chronic cough 04/18/2013   Breast cancer of upper-inner quadrant of left female breast ([place]) 03/18/2010   Hyperlipidemia 11/07/2009   Fibromyalgia 10/29/2009   Complete heart block ([place]) s/p MDT BiVP 10/23/2009   PACEMAKER, PERMANENT 10/23/2009   ALKALINE PHOSPHATASE, ELEVATED 10/13/2009   Hypothyroidism 10/03/2009   Benign essential  hypertension 07/02/2009   Chronic back pain 07/02/2009    Conditions to be addressed/monitored: HTN and HLD.  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  Care Plan : LCSW Plan of Care  Updates made by Francis Gaines, LCSW since 10/17/2021 12:00 AM     Problem: Find Help in My Community.   Priority: High     Goal: Find Help in My Community.   Start Date: 10/16/2021  Expected End Date: 01/13/2022  This Visit's Progress: On track  Priority: High  Note:   Current Barriers:   Financial constraints related to limited/fixed income. Lacks knowledge of available community agencies and resources in [place]. Clinical Goals:  Patient will work with LCSW to address needs related to financial insecurities and inability to pay outstanding medical expenses.   Interventions: Collaboration with Primary Care Physician, Dr. Beatrice Lecher, regarding development and update of comprehensive plan of care as evidenced by provider attestation and co-signature. Inter-disciplinary care team collaboration (see longitudinal plan of care). Clinical Interventions: Patient interviewed and appropriate assessments performed. Assessment of needs, barriers, agencies contacted, as well as how impacting. Reviewed various resources and discussed options with patient. Provided patient with information about all available community agencies and resources in [facility] that may be able to offer financial assistance. Discussed plans with patient for ongoing care management follow-up and provided patient with direct contact information for care management team. Advised patient to begin calling list of community agencies and resources, provided to her by LCSW, in an effort to obtain financial assistance. Assisted patient with obtaining information about health plan benefits through [employer]. Other Clinical Interventions: PHQ 2 and PHQ 9 Depression Screen performed  and results reviewed with patient. Solution-Focused Strategies implemented. Active Listening/Reflection utilized. Emotional Support provided and Verbalization of Feelings encouraged.   Problem Solving/Task-Centered Skills developed. Motivational Interviewing performed. Quality of Sleep assessed and Sleep Hygiene Techniques promoted. Crisis Resource Education/Information provided. Patient Goals/Self-Care Activities: Work with LCSW, on a bi-weekly basis, to obtain financial assistance to help pay for outstanding medical expenses.  Contact [facility] [phone]), to request financial assistance through their Emergency Assistance Program, to help pay for rent and utilities. Contact [employer] of [place] [phone]), to request community health and human service resources.  Secondary school teacher of [place] [phone]), to request budget counseling, rental assistance, and utility assistance. Complete application for [facility], and submit to [facility] Department for processing. Contact LCSW directly (# [phone]), if you have questions, need assistance, or if additional social work needs are identified between now and our next scheduled telephone outreach call.  Follow-Up Date:  10/31/2021 at 1:00 pm     Nat Christen Algona Clinical Social Worker [place] [phone]

## 2021-10-17 NOTE — Patient Instructions (Signed)
Visit Information   Thank you for taking time to visit with me today. Please don't hesitate to contact me if I can be of assistance to you before our next scheduled telephone appointment.  Following are the goals we discussed today:  Patient Goals/Self-Care Activities: Work with LCSW, on a bi-weekly basis, to obtain financial assistance to help pay for outstanding medical expenses.  Contact [facility] [phone]), to request financial assistance through their Emergency Assistance Program, to help pay for rent and utilities. Contact [employer] of [place] [phone]), to request community health and human service resources.  Secondary school teacher of [place] [phone]), to request budget counseling, rental assistance, and utility assistance. Complete application for [facility], and submit to [facility] Department for processing. Contact LCSW directly (# [phone]), if you have questions, need assistance, or if additional social work needs are identified between now and our next scheduled telephone outreach call. Follow-Up Date:  10/31/2021 at 1:00 pm   Please call the care guide team at [phone] if you need to cancel or reschedule your appointment.   If you are experiencing a Mental Health or [place] or need someone to talk to, please call the Suicide and Crisis Lifeline: 988 call the [place] National Suicide Prevention Lifeline: 5414307759 or TTY: 281 325 1653 TTY 860-352-7141) to talk to a trained counselor call 1-800-273-TALK (toll free, 24 hour hotline) go to [facility] [place] [phone]) call the [facility]: [phone] call 911   Following is a copy of your full care plan:  Care Plan : LCSW Plan of Care  Updates made by Francis Gaines, LCSW since 10/17/2021 12:00 AM     Problem: Find Help in  My Community.   Priority: High     Goal: Find Help in My Community.   Start Date: 10/16/2021  Expected End Date: 01/13/2022  This Visit's Progress: On track  Priority: High  Note:   Current Barriers:   Financial constraints related to limited/fixed income. Lacks knowledge of available community agencies and resources in [place]. Clinical Goals:  Patient will work with LCSW to address needs related to financial insecurities and inability to pay outstanding medical expenses.   Interventions: Collaboration with Primary Care Physician, Dr. Beatrice Lecher, regarding development and update of comprehensive plan of care as evidenced by provider attestation and co-signature. Inter-disciplinary care team collaboration (see longitudinal plan of care). Clinical Interventions: Patient interviewed and appropriate assessments performed. Assessment of needs, barriers, agencies contacted, as well as how impacting. Reviewed various resources and discussed options with patient. Provided patient with information about all available community agencies and resources in [facility] that may be able to offer financial assistance. Discussed plans with patient for ongoing care management follow-up and provided patient with direct contact information for care management team. Advised patient to begin calling list of community agencies and resources, provided to her by LCSW, in an effort to obtain financial assistance. Assisted patient with obtaining information about health plan benefits through [employer]. Other Clinical Interventions: PHQ 2 and PHQ 9 Depression Screen performed and results reviewed with patient. Solution-Focused Strategies implemented. Active Listening/Reflection utilized. Emotional Support provided and Verbalization of Feelings encouraged.   Problem Solving/Task-Centered Skills developed. Motivational Interviewing performed. Quality of Sleep assessed and Sleep  Hygiene Techniques promoted. Crisis Resource Education/Information provided. Patient Goals/Self-Care Activities: Work with LCSW, on a bi-weekly basis, to obtain financial assistance to help pay for  outstanding medical expenses.  Contact [facility] [phone]), to request financial assistance through their Emergency Assistance Program, to help pay for rent and utilities. Contact [employer] of [place] [phone]), to request community health and human service resources.  Secondary school teacher of [place] [phone]), to request budget counseling, rental assistance, and utility assistance. Complete application for [facility], and submit to [facility] Department for processing. Contact LCSW directly (# [phone]), if you have questions, need assistance, or if additional social work needs are identified between now and our next scheduled telephone outreach call. Follow-Up Date:  10/31/2021 at 1:00 pm       Consent to CCM Services: Ms. Rivard was given information about Chronic Care Management services including:  CCM service includes personalized support from designated clinical staff supervised by her physician, including individualized plan of care and coordination with other care providers 24/7 contact phone numbers for assistance for urgent and routine care needs. Service will only be billed when office clinical staff spend 20 minutes or more in a month to coordinate care. Only one practitioner may furnish and bill the service in a calendar month. The patient may stop CCM services at any time (effective at the end of the month) by phone call to the office staff. The patient will be responsible for cost sharing (co-pay) of up to 20% of the service fee (after annual deductible is met).  Patient agreed to services and verbal consent obtained.   Patient verbalizes understanding of instructions and care plan  provided today and agrees to view in [facility]. Active MyChart status confirmed with patient.    Telephone follow up appointment with care management team member scheduled for:  10/31/2021 at 1:00 pm.  Nat Christen Knott Licensed Clinical Social Worker [place] [phone]

## 2021-10-20 DIAGNOSIS — K581 Irritable bowel syndrome with constipation: Secondary | ICD-10-CM | POA: Diagnosis not present

## 2021-10-20 DIAGNOSIS — K644 Residual hemorrhoidal skin tags: Secondary | ICD-10-CM | POA: Diagnosis not present

## 2021-10-20 DIAGNOSIS — K7401 Hepatic fibrosis, early fibrosis: Secondary | ICD-10-CM | POA: Diagnosis not present

## 2021-10-20 DIAGNOSIS — K7581 Nonalcoholic steatohepatitis (NASH): Secondary | ICD-10-CM | POA: Diagnosis not present

## 2021-10-20 DIAGNOSIS — K219 Gastro-esophageal reflux disease without esophagitis: Secondary | ICD-10-CM | POA: Diagnosis not present

## 2021-10-20 NOTE — Telephone Encounter (Signed)
Okay, are they able to obtain a new up-to-date download so that we can make sure that her AHI is adequately treated.

## 2021-10-21 ENCOUNTER — Ambulatory Visit: Payer: Medicare Other | Admitting: Family Medicine

## 2021-10-21 ENCOUNTER — Other Ambulatory Visit: Payer: Self-pay | Admitting: Family Medicine

## 2021-10-21 DIAGNOSIS — I10 Essential (primary) hypertension: Secondary | ICD-10-CM

## 2021-10-21 DIAGNOSIS — M19072 Primary osteoarthritis, left ankle and foot: Secondary | ICD-10-CM

## 2021-10-21 DIAGNOSIS — E785 Hyperlipidemia, unspecified: Secondary | ICD-10-CM

## 2021-10-21 DIAGNOSIS — E118 Type 2 diabetes mellitus with unspecified complications: Secondary | ICD-10-CM

## 2021-10-21 DIAGNOSIS — D509 Iron deficiency anemia, unspecified: Secondary | ICD-10-CM

## 2021-10-21 DIAGNOSIS — E7800 Pure hypercholesterolemia, unspecified: Secondary | ICD-10-CM

## 2021-10-21 DIAGNOSIS — E1169 Type 2 diabetes mellitus with other specified complication: Secondary | ICD-10-CM

## 2021-10-21 DIAGNOSIS — I5022 Chronic systolic (congestive) heart failure: Secondary | ICD-10-CM

## 2021-10-21 NOTE — Progress Notes (Signed)
Office Visit    Patient Name: Tanya Hogan Date of Encounter: 09/29/2021  PCP:  Hali Marry, MD   [facility]  Cardiologist:  Cristopher Peru, MD  Advanced Practice Provider:  No care team member to display Electrophysiologist:  None    HPI    Tanya Hogan is a 71 y.o. female with a hx of chronic systolic heart failure and CHB status post biventricular PPM, depression, GERD, LBBB, diabetes mellitus without complication, hypertension, hypothyroidism presents today for follow-up.  The patient had breast cancer and was status post meniscectomy.  She has been hospital for CHF exacerbation early 2022.  She had an SVT was placed on amiodarone which she did not tolerate but does tolerate 100 mg daily.  She was last seen in the office April 2022 by Dr. Lovena Le at that time she was not having any angina.  Her left heart cath demonstrated no CAD.  Her EF was 35 to 40% by echocardiogram.  She remained on Coreg and amiodarone.  She had been intolerant to ACE inhibitor.  At that time no medication changes were made and she appeared euvolemic on exam.  Patient was recently in the ED over at [place] on 09/12/2021.  Noted to be in decompensated heart failure with a BNP of 4700 and chest x-ray showing evidence of vascular congestion with bilateral pleural effusions.  She is started on Bumex twice daily with improvement of symptoms.  She initially was requiring 3 L of oxygen was able to be weaned down to room air.  Repeat echocardiogram showed EF of 20%.  Patient achieved euvolemia and was started on spironolactone and Jardiance at discharge.  She was last seen by me early February 2023 and at that time she felt like she was being held down and had no energy. She had felt much better after discontinuing spirolactone. She continued to have weight loss since starting on Ozempic. She has not noticed any significant edema in her legs. She states she does try to maintain a  low-sodium, heart healthy diet however it is challenging because her diet is already restricted with her IBS. She also has many medication intolerances and allergies. She was willing however, to try Coreg at the lowest dose. She was also interested in participating in cardiac rehab as long as her insurance pays for it. We discussed getting her in to see a nutritionist as well which she seemed interested in.  She was last seen 10/13/2021 and continued to have dizziness/lightheadedness.  On Saturday she was walking around [employer] trying to get some steps and and felt fine during and after.  However, on Sunday she was down the entire day and felt dizzy and unwell.  She stated that her back was extremely sore as well and she sees Dr. Lonia Mad for this.  She states she was still having occasional shortness of breath.  Her weight has been stable.  We discussed stopping diltiazem since her EF is still low and this will also help bring up her blood pressure a bit.  Her blood pressure was low normal and I encouraged her to take it at home and record those values.  I asked her to take her blood pressure at the same time every day.  She seemed to be tolerating the Coreg that we started her on last time well.  We started her on the lowest dose 3.138m.   I think she would really benefit from rehab because I think at least  some of her symptoms are deconditioning related.  She sees her primary care tomorrow and will likely get labs at that time.  Recommend a CMP to check liver and kidney function.  The only medication that might have a slight effect on her liver is the amiodarone but she is only on 100 mg a day which is a very low dose.  She also states that she is on BiPAP at night and occasionally wakes up short of breath and has to take her albuterol.  Asked her to discuss this with her primary care since her BiPAP settings may need adjusting.  Today, she was seen Friday by Dr. Lovena Le and he recommended left bundle area  pacing. She plans to have the procedure done next week and hopes this will make her feel better. We reviewed her labs and changed her potassium supplement to every other day. She is encouraged to take mucinex and allegra for her nasal congestion and cough, without the decongestive portion. She mentioned that she has been eating lots of fruits and vegetables. She states that she has had some BP numbers that were in the 16S systolic a few days in a row, but they have been better. Today BP was 110/72. Would be hesitant to decrease her diuretics since she has been on that dose of Bumex for a long time and appears euvolemic on exam today. When she misses a dose she had trouble breathing and has increased edema in her lower legs.   Reports no shortness of breath nor dyspnea on exertion. Reports no chest pain, pressure, or tightness. No edema, orthopnea, PND. Reports no palpitations.     Past Medical History    Past Medical History:  Diagnosis Date   Alkaline phosphatase elevation    Asthma    Breast cancer ([place]) 2011   Carpal tunnel syndrome    Carpal tunnel syndrome    bilateral   CHF (congestive heart failure) (HCC)    Cirrhosis, non-alcoholic (HCC)    Closed fracture of unspecified part of radius (alone)    DDD (degenerative disc disease), lumbar    Depression    pt denies   Diabetes mellitus without complication ([place])    Enlarged heart    Fibromyalgia    GERD (gastroesophageal reflux disease)    History of kidney stones    HTN (hypertension)    Hypothyroidism    IBS (irritable bowel syndrome)    LBBB (left bundle branch block)    Microcytic anemia 05/14/2017   Pneumonia    Presence of permanent cardiac pacemaker    PVD (peripheral vascular disease) ([place])    2019   Sleep apnea    uses cpap   Past Surgical History:  Procedure Laterality Date   BREAST LUMPECTOMY Left 2011   COLONOSCOPY     FOOT SURGERY     KNEE SURGERY     MASTECTOMY Right    05/2019   PACEMAKER GENERATOR  CHANGE N/A 08/23/2014   Procedure: PACEMAKER GENERATOR CHANGE;  Surgeon: Evans Lance, MD;  Location: [facility] CATH LAB;  Service: Cardiovascular;  Laterality: N/A;   PACEMAKER PLACEMENT  2009   [facility]   RECTAL SURGERY  1976   REDUCTION MAMMAPLASTY Left    05/2019   TOTAL SHOULDER ARTHROPLASTY Right 03/10/2018   TOTAL SHOULDER ARTHROPLASTY Right 03/10/2018   Procedure: RIGHT TOTAL SHOULDER ARTHROPLASTY;  Surgeon: [name], MD;  Location: [facility];  Service: Orthopedics;  Laterality: Right;   TUBAL LIGATION  Allergies  Allergies  Allergen Reactions   Ferric Carboxymaltose Shortness Of Breath    Mild SOB following injectafer infusion Mild SOB following injectafer infusion    Penicillins Anaphylaxis    Has patient had a PCN reaction causing immediate rash, facial/tongue/throat swelling, SOB or lightheadedness with hypotension: Yes Has patient had a PCN reaction causing severe rash involving mucus membranes or skin necrosis: No Has patient had a PCN reaction that required hospitalization: No Has patient had a PCN reaction occurring within the last 10 years: No If all of the above answers are "NO", then may proceed with Cephalosporin use.    Accupril [Quinapril Hcl] Other (See Comments)    Hyperkalemia   Atorvastatin Other (See Comments)    msucle aches   Celebrex [Celecoxib]     ulcers   Fluticasone Other (See Comments)    Nasal ulcer.     Levocetirizine     Doesn't recall   Levomenol Other (See Comments)    unknown unknown    Losartan Potassium Itching   Naproxen Swelling   Nasonex [Mometasone Furoate] Other (See Comments)    nosebleed   Chamomile Other (See Comments)    unknown   Vesicare [Solifenacin] Other (See Comments)    "stopped me from urinating"   Aspirin Other (See Comments)    Pt states "burns holes in stomach", ulcers    Baking Soda-Fluoride [Sodium Fluoride] Itching   Clindamycin/Lincomycin Rash   Codeine Nausea And Vomiting    Furosemide Other (See Comments)    Nose bleed   Lactose    Lactose Intolerance (Gi) Other (See Comments)    Increased calcium, sneezing, nasal issues   Lanolin-Petrolatum Rash   Metformin And Related Diarrhea   Miralax [Polyethylene Glycol 3350]    Miralax [Polyethylene Glycol] Rash   Nasonex [Mometasone Furoate] Other (See Comments)   Quinine Rash   Rifampin Other (See Comments)    DOES NOT REMEMBER THIS MEDICATION OR ALLERGY   Sulfa Antibiotics Rash   Sulfonamide Derivatives Rash   Tramadol Rash    Anxiety   Wellbutrin [Bupropion] Other (See Comments)    Muscle aches, pain, "made my skin crawl"   Wool Alcohol [Lanolin] Rash   Zoledronic Acid Other (See Comments) and Rash    shaking     EKGs/Labs/Other Studies Reviewed:   The following studies were reviewed today:  Echocardiogram 09/21/2021   The study was technically difficult.    Left Ventricle: Left ventricle is mildly dilated.Systolic function is  severely abnormal. EF: 20 %.    Right Ventricle: Right ventricle is mildly dilated. Systolic function  is moderately reduced. Abnormal tricuspid annular plane systolic excursion  (TAPSE) <1.7 cm.    Left Atrium: Left atrium is mildly dilated.    Mitral Valve: There is mild regurgitation with a centrally directed  jet.    Tricuspid Valve: The right ventricular systolic pressure is mildly  elevated (37-49 mmHg).   EKG:  EKG is not ordered today.   Recent Labs: 04/16/2021: ALT 18; Hemoglobin 12.2; Platelets 299 08/14/2021: TSH 2.54 09/23/2021: BUN 15; Creat 1.12; Potassium 4.2; Sodium 141  Recent Lipid Panel    Component Value Date/Time   CHOL 168 04/16/2021 0000   TRIG 120 04/16/2021 0000   HDL 73 04/16/2021 0000   CHOLHDL 2.3 04/16/2021 0000   VLDL 26 02/18/2017 0939   LDLCALC 74 04/16/2021 0000     Home Medications   Current Meds  Medication Sig   albuterol (PROVENTIL) (2.5 MG/3ML) 0.083% nebulizer solution Take 2.5 mg  by nebulization every 6 (six) hours as  needed for wheezing or shortness of breath.   albuterol (VENTOLIN HFA) 108 (90 Base) MCG/ACT inhaler Inhale 1-2 puffs into the lungs every 6 (six) hours as needed for wheezing or shortness of breath.   alendronate (FOSAMAX) 70 MG tablet TAKE 1 TABLET BY MOUTH  EVERY 7 DAYS WITH A FULL  GLASS OF WATER ON AN EMPTY  STOMACH   ALLEGRA-D ALLERGY & CONGESTION 60-120 MG 12 hr tablet    AMBULATORY NON FORMULARY MEDICATION Medication Name: Tubing and supplies for nebulizer. Fax - [phone]   amiodarone (PACERONE) 200 MG tablet TAKE ONE-HALF TABLET BY  MOUTH DAILY   B Complex-C (B-COMPLEX WITH VITAMIN C) tablet Take 1 tablet by mouth daily.   bumetanide (BUMEX) 1 MG tablet TAKE 1 TABLET BY MOUTH  TWICE DAILY   Calcium Citrate-Vitamin D (CITRACAL + D PO) Take 1 tablet by mouth daily.   carvedilol (COREG) 3.125 MG tablet Take 1 tablet (3.125 mg total) by mouth 2 (two) times daily.   Continuous Blood Gluc Receiver (FREESTYLE LIBRE 2 READER) DEVI by Does not apply route.   Continuous Blood Gluc Sensor (FREESTYLE LIBRE 2 SENSOR) MISC by Does not apply route.   diltiazem (CARDIZEM CD) 180 MG 24 hr capsule TAKE 1 CAPSULE BY MOUTH  DAILY   levothyroxine (SYNTHROID) 88 MCG tablet TAKE 1 TABLET(88 MCG) BY MOUTH DAILY   LINZESS 72 MCG capsule Take 72 mcg by mouth 2 (two) times daily.   Multiple Vitamins-Minerals (OCUVITE EYE + MULTI) TABS Take 1 tablet by mouth daily.   pantoprazole (PROTONIX) 40 MG tablet Take 40 mg by mouth daily.   potassium chloride (KLOR-CON M) 10 MEQ tablet Take 1 tablet (10 mEq total) by mouth daily.   pravastatin (PRAVACHOL) 40 MG tablet TAKE 1 TABLET BY MOUTH AT  BEDTIME   Probiotic Product ([employer] PROBIOTIC) CAPS    Semaglutide,0.25 or 0.5MG/DOS, (OZEMPIC, 0.25 OR 0.5 MG/DOSE,) 2 MG/1.5ML SOPN Inject 0.25 mg into the skin.   valACYclovir (VALTREX) 1000 MG tablet Take 1 tablet (1,000 mg total) by mouth 2 (two) times daily.     Review of Systems      All other systems reviewed and are  otherwise negative except as noted above.  Physical Exam    Vitals:   10/27/21 1029  BP: 110/72  Pulse: 73  SpO2: 97%      Wt Readings from Last 3 Encounters:  09/29/21 199 lb 14.4 oz (90.7 kg)  09/26/21 201 lb (91.2 kg)  09/23/21 202 lb (91.6 kg)     GEN: Well nourished, well developed, in no acute distress. HEENT: normal. Neck: Supple, no JVD, carotid bruits, or masses. Cardiac: RRR, no murmurs, rubs, or gallops. No clubbing, cyanosis, edema.  Radials/PT 2+ and equal bilaterally.  Respiratory:  Respirations regular and unlabored, clear to auscultation bilaterally. GI: Soft, nontender, nondistended. MS: No deformity or atrophy. Skin: Warm and dry, no rash. Neuro:  Strength and sensation are intact. Psych: Slightly depressed today  Assessment & Plan    Acute on chronic HFrEF -She appears euvolemic on exam today -She has stopped spironolactone due to fatigue and feels better since doing so -Recent Echo 09/21/2021, EF is 20% -Continue GDMT: Bumex 1 mg twice daily, Ozempic 0.25 mg injection, Coreg 3.142m -She is on a 50 oz fluid restriction  -Heart failure medications have been difficult to add due to a lot of medication intolerances and allergies.  COPD -Continue follow-up with your pulmonologist  Hypertension -Today,  110/72 -Continue to monitor your blood pressure at home   Diabetes mellitus type 2 -Recently started on Ozempic, Jardiance also prescribed -Continues to loose weight -Blood glucose was well controlled on current regimen -Referral to nutritionist for assistance with diet for IBS, HF, and DM2. She states she doesn't have the money for this right now but is interested in pursuing in the future.  NSVT -Amio 167m daily -She has not had any recurrent symptoms -Primary checking LFTs tomorrow  Hypothyroidism -Continue to f/u with your PCP  7. SOB at night -BiPAP at night  8. Planned left bundle area lead with Dr. TLovena Lenext week -Plan to f/u with  the patient after her procedure to see how she is feeling    Cardiac Rehabilitation Eligibility Assessment   Cleared for cardiac rehab  Disposition: Follow up 6 weeks with Dr. PJohney Frameor APP.   Signed, TElgie Collard PA-C 10/13/2021 [facility]

## 2021-10-22 NOTE — Telephone Encounter (Signed)
I called [employer]. They state they do not have anything to download. They also states she may have received a different machine. They are going to reach out to the patient to get the information they need. Then they will fax [place] the download.  ?

## 2021-10-24 ENCOUNTER — Other Ambulatory Visit: Payer: Self-pay

## 2021-10-24 ENCOUNTER — Ambulatory Visit: Payer: Medicare Other | Admitting: Dietician

## 2021-10-24 ENCOUNTER — Ambulatory Visit: Payer: Medicare Other | Admitting: Internal Medicine

## 2021-10-24 VITALS — BP 112/76 | HR 75 | Ht 61.0 in | Wt 195.0 lb

## 2021-10-24 DIAGNOSIS — I442 Atrioventricular block, complete: Secondary | ICD-10-CM | POA: Diagnosis not present

## 2021-10-24 DIAGNOSIS — I472 Ventricular tachycardia, unspecified: Secondary | ICD-10-CM | POA: Insufficient documentation

## 2021-10-24 DIAGNOSIS — Z950 Presence of cardiac pacemaker: Secondary | ICD-10-CM

## 2021-10-24 DIAGNOSIS — I4729 Other ventricular tachycardia: Secondary | ICD-10-CM | POA: Diagnosis not present

## 2021-10-24 DIAGNOSIS — I5022 Chronic systolic (congestive) heart failure: Secondary | ICD-10-CM

## 2021-10-24 LAB — CBC WITH DIFFERENTIAL/PLATELET
Basophils Absolute: 0 10*3/uL (ref 0.0–0.2)
Basos: 1 %
EOS (ABSOLUTE): 0.1 10*3/uL (ref 0.0–0.4)
Eos: 1 %
Hematocrit: 46.6 % (ref 34.0–46.6)
Hemoglobin: 15.1 g/dL (ref 11.1–15.9)
Immature Grans (Abs): 0 10*3/uL (ref 0.0–0.1)
Immature Granulocytes: 0 %
Lymphocytes Absolute: 1.5 10*3/uL (ref 0.7–3.1)
Lymphs: 25 %
MCH: 30.9 pg (ref 26.6–33.0)
MCHC: 32.4 g/dL (ref 31.5–35.7)
MCV: 95 fL (ref 79–97)
Monocytes Absolute: 0.4 10*3/uL (ref 0.1–0.9)
Monocytes: 7 %
Neutrophils Absolute: 4 10*3/uL (ref 1.4–7.0)
Neutrophils: 66 %
Platelets: 243 10*3/uL (ref 150–450)
RBC: 4.89 x10E6/uL (ref 3.77–5.28)
RDW: 12.6 % (ref 11.7–15.4)
WBC: 6.1 10*3/uL (ref 3.4–10.8)

## 2021-10-24 LAB — BASIC METABOLIC PANEL
BUN/Creatinine Ratio: 14 (ref 12–28)
BUN: 15 mg/dL (ref 8–27)
CO2: 25 mmol/L (ref 20–29)
Calcium: 10.4 mg/dL — ABNORMAL HIGH (ref 8.7–10.3)
Chloride: 103 mmol/L (ref 96–106)
Creatinine, Ser: 1.08 mg/dL — ABNORMAL HIGH (ref 0.57–1.00)
Glucose: 121 mg/dL — ABNORMAL HIGH (ref 70–99)
Potassium: 5.1 mmol/L (ref 3.5–5.2)
Sodium: 142 mmol/L (ref 134–144)
eGFR: 55 mL/min/{1.73_m2} — ABNORMAL LOW (ref >59)

## 2021-10-24 NOTE — H&P (View-Only) (Signed)
HPI Tanya Hogan returns today for followup. She is a pleasant  71 yo woman with chronic systolic heart failure and CHB, s/p Biv PPM insertion. She has had some increased thresholds but has been stable from a cardiac perspective. She has breast CA. She is s/p surgery. She has also had a breast resection.No syncope. Over the past several months she has had progressive worsening of her sob and fatigue and weakness. Her paced QRS is 200 ms. Her LV threshold is elevated.  Allergies  Allergen Reactions   Ferric Carboxymaltose Shortness Of Breath    Mild SOB following injectafer infusion Mild SOB following injectafer infusion    Penicillins Anaphylaxis    Has patient had a PCN reaction causing immediate rash, facial/tongue/throat swelling, SOB or lightheadedness with hypotension: Yes Has patient had a PCN reaction causing severe rash involving mucus membranes or skin necrosis: No Has patient had a PCN reaction that required hospitalization: No Has patient had a PCN reaction occurring within the last 10 years: No If all of the above answers are "NO", then may proceed with Cephalosporin use.    Accupril [Quinapril Hcl] Other (See Comments)    Hyperkalemia   Atorvastatin Other (See Comments)    msucle aches   Celebrex [Celecoxib]     ulcers   Fluticasone Other (See Comments)    Nasal ulcer.     Levocetirizine     Doesn't recall   Levomenol Other (See Comments)    unknown unknown    Losartan Potassium Itching   Naproxen Swelling   Nasonex [Mometasone Furoate] Other (See Comments)    nosebleed   Chamomile Other (See Comments)    unknown   Vesicare [Solifenacin] Other (See Comments)    "stopped me from urinating"   Aspirin Other (See Comments)    Pt states "burns holes in stomach", ulcers    Baking Soda-Fluoride [Sodium Fluoride] Itching   Clindamycin/Lincomycin Rash   Codeine Nausea And Vomiting   Furosemide Other (See Comments)    Nose bleed   Lactose    Lactose  Intolerance (Gi) Other (See Comments)    Increased calcium, sneezing, nasal issues   Lanolin-Petrolatum Rash   Metformin And Related Diarrhea   Miralax [Polyethylene Glycol 3350]    Miralax [Polyethylene Glycol] Rash   Nasonex [Mometasone Furoate] Other (See Comments)   Quinine Rash   Rifampin Other (See Comments)    DOES NOT REMEMBER THIS MEDICATION OR ALLERGY   Sulfa Antibiotics Rash   Sulfonamide Derivatives Rash   Tramadol Rash    Anxiety   Wellbutrin [Bupropion] Other (See Comments)    Muscle aches, pain, "made my skin crawl"   Wool Alcohol [Lanolin] Rash   Zoledronic Acid Other (See Comments) and Rash    shaking     Current Outpatient Medications  Medication Sig Dispense Refill   albuterol (PROVENTIL) (2.5 MG/3ML) 0.083% nebulizer solution Take 2.5 mg by nebulization every 6 (six) hours as needed for wheezing or shortness of breath.     albuterol (VENTOLIN HFA) 108 (90 Base) MCG/ACT inhaler Inhale 1-2 puffs into the lungs every 6 (six) hours as needed for wheezing or shortness of breath. 54 g 3   alendronate (FOSAMAX) 70 MG tablet TAKE 1 TABLET BY MOUTH  EVERY 7 DAYS WITH A FULL  GLASS OF WATER ON AN EMPTY  STOMACH 12 tablet 4   ALLEGRA-D ALLERGY & CONGESTION 60-120 MG 12 hr tablet      AMBULATORY NON FORMULARY MEDICATION Medication Name: Tubing and  supplies for nebulizer. Fax - [phone] 1 vial 0   amiodarone (PACERONE) 200 MG tablet TAKE ONE-HALF TABLET BY  MOUTH DAILY 45 tablet 2   B Complex-C (B-COMPLEX WITH VITAMIN C) tablet Take 1 tablet by mouth daily.     bumetanide (BUMEX) 1 MG tablet TAKE 1 TABLET BY MOUTH TWICE  DAILY 180 tablet 3   Calcium Citrate-Vitamin D (CITRACAL + D PO) Take 1 tablet by mouth daily.     carvedilol (COREG) 3.125 MG tablet Take 1 tablet (3.125 mg total) by mouth 2 (two) times daily. 180 tablet 3   Continuous Blood Gluc Receiver (FREESTYLE LIBRE 2 READER) DEVI by Does not apply route.     Continuous Blood Gluc Sensor (FREESTYLE LIBRE 2  SENSOR) MISC by Does not apply route.     empagliflozin (JARDIANCE) 10 MG TABS tablet Take 1 tablet (10 mg total) by mouth daily before breakfast. 30 tablet 1   Insulin Pen Needle (PEN NEEDLES) 30G X 8 MM MISC 1 Device by Does not apply route as needed. 100 each 0   levothyroxine (SYNTHROID) 88 MCG tablet TAKE 1 TABLET(88 MCG) BY MOUTH DAILY 90 tablet 0   LINZESS 72 MCG capsule Take 72 mcg by mouth 2 (two) times daily.     Multiple Vitamins-Minerals (OCUVITE EYE + MULTI) TABS Take 1 tablet by mouth daily.     pantoprazole (PROTONIX) 40 MG tablet Take 40 mg by mouth daily.     potassium chloride (KLOR-CON M) 10 MEQ tablet Take 1 tablet (10 mEq total) by mouth daily. 90 tablet 0   pravastatin (PRAVACHOL) 40 MG tablet TAKE 1 TABLET BY MOUTH AT  BEDTIME 90 tablet 3   Probiotic Product ([employer] PROBIOTIC) CAPS      Semaglutide,0.25 or 0.5MG/DOS, (OZEMPIC, 0.25 OR 0.5 MG/DOSE,) 2 MG/1.5ML SOPN Inject 0.25 mg into the skin.     valACYclovir (VALTREX) 1000 MG tablet Take 1 tablet (1,000 mg total) by mouth 2 (two) times daily. 14 tablet 2   fluticasone-salmeterol (ADVAIR HFA) 115-21 MCG/ACT inhaler Inhale 2 puffs into the lungs 2 (two) times daily.     Tiotropium Bromide Monohydrate 2.5 MCG/ACT AERS Inhale 2 puffs into the lungs daily.     No current facility-administered medications for this visit.     Past Medical History:  Diagnosis Date   Alkaline phosphatase elevation    Asthma    Breast cancer ([place]) 2011   Carpal tunnel syndrome    Carpal tunnel syndrome    bilateral   CHF (congestive heart failure) (HCC)    Cirrhosis, non-alcoholic (HCC)    Closed fracture of unspecified part of radius (alone)    DDD (degenerative disc disease), lumbar    Depression    pt denies   Diabetes mellitus without complication ([place])    Enlarged heart    Fibromyalgia    GERD (gastroesophageal reflux disease)    History of kidney stones    HTN (hypertension)    Hypothyroidism    IBS (irritable bowel syndrome)     LBBB (left bundle branch block)    Microcytic anemia 05/14/2017   Pneumonia    Presence of permanent cardiac pacemaker    PVD (peripheral vascular disease) ([place])    2019   Sleep apnea    uses cpap    ROS:   All systems reviewed and negative except as noted in the HPI.   Past Surgical History:  Procedure Laterality Date   BREAST LUMPECTOMY Left 2011   COLONOSCOPY  FOOT SURGERY     KNEE SURGERY     MASTECTOMY Right    05/2019   PACEMAKER GENERATOR CHANGE N/A 08/23/2014   Procedure: PACEMAKER GENERATOR CHANGE;  Surgeon: Evans Lance, MD;  Location: [facility] C[facility] LAB;  Service: Cardiovascular;  Laterality: N/A;   PACEMAKER PLACEMENT  2009   [facility]   RECTAL SURGERY  1976   REDUCTION MAMMAPLASTY Left    05/2019   TOTAL SHOULDER ARTHROPLASTY Right 03/10/2018   TOTAL SHOULDER ARTHROPLASTY Right 03/10/2018   Procedure: RIGHT TOTAL SHOULDER ARTHROPLASTY;  Surgeon: [name], MD;  Location: [facility];  Service: Orthopedics;  Laterality: Right;   TUBAL LIGATION       Family History  Problem Relation Age of Onset   Heart disease Mother    Melanoma Mother    Parkinson's disease Mother    Heart disease Father    Heart disease Brother    Heart failure Other    Breast cancer Other    Hypertension Other      Social History   Socioeconomic History   Marital status: Divorced    Spouse name: Not on file   Number of children: 2   Years of education: 16   Highest education level: Associate degree: academic program  Occupational History   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 Lives with and helps her brother. Likes Systems developer, Social worker and music. Loves animals.   Social Determinants of Health   Financial Resource Strain: Low Risk    Difficulty of Paying Living Expenses: Not hard at all  Food Insecurity: No Food Insecurity   Worried About Charity fundraiser in the Last Year: Never true   [place] in the Last Year: Never true  Transportation Needs: No Transportation Needs   Lack of Transportation (Medical): No   Lack of Transportation (Non-Medical): No  Physical Activity: Inactive   Days of Exercise per Week: 0 days   Minutes of Exercise per Session: 0 min  Stress: No Stress Concern Present   Feeling of Stress : Not at all  Social Connections: Socially Isolated   Frequency of Communication with Friends and Family: More than three times a week   Frequency of Social Gatherings with Friends and Family: Once a week   Attends Religious Services: Never   Marine scientist or Organizations: No   Attends Music therapist: Never   Marital Status: Divorced  Human resources officer Violence: Not At Risk   Fear of Current or Ex-Partner: No   Emotionally Abused: No   Physically Abused: No   Sexually Abused: No     BP 112/76    Pulse 75    Ht 5' 1"  (1.549 m)    Wt 195 lb (88.5 kg)    SpO2 96%    BMI 36.84 kg/m   Physical Exam:  Well appearing NAD HEENT: Unremarkable Neck:  No JVD, no  thyromegally Lymphatics:  No adenopathy Back:  No CVA tenderness Lungs:  Clear HEART:  Regular rate rhythm, no murmurs, no rubs, no clicks Abd:  soft, positive bowel sounds, no organomegally, no rebound, no guarding Ext:  2 plus pulses, no edema, no cyanosis, no clubbing Skin:  No rashes no nodules Neuro:  CN II through XII intact, motor grossly intact  EKG - reviewed. NSR with ventricular pacing.   DEVICE  Normal device function.  See PaceArt for details.   Assess/Plan:  Chronic  systolic heart failure - her symptoms are class 3B. She has a very wide QRS, despite pacing. I have discussed the treatment options with the patient and recommended left bundle area pacing in hopes of improving her symptoms. Her current paced QRS is 200 ms.  Biv PPM - she is approaching ERI.  3. Obesity her bmi is 78. She will need to work on this. 4. PVC's - these are stable as she is v. Paced 99%.   Tanya Overlie Jerra Huckeby,MD

## 2021-10-24 NOTE — Patient Instructions (Addendum)
Medication Instructions:  ?Your physician recommends that you continue on your current medications as directed. Please refer to the Current Medication list given to you today. ? ?Labwork: ?You will get lab work today:  CBC and BMP ? ?Testing/Procedures: ?None ordered. ? ?Follow-Up: ? ?SEE INSTRUCTION LETTER ? ?Any Other Special Instructions Will Be Listed Below (If Applicable). ? ?If you need a refill on your cardiac medications before your next appointment, please call your pharmacy.  ? ? ? ? ? ?

## 2021-10-24 NOTE — Progress Notes (Signed)
HPI Tanya Hogan returns today for followup. She is a pleasant  71 yo woman with chronic systolic heart failure and CHB, s/p Biv PPM insertion. She has had some increased thresholds but has been stable from a cardiac perspective. She has breast CA. She is s/p surgery. She has also had a breast resection.No syncope. Over the past several months she has had progressive worsening of her sob and fatigue and weakness. Her paced QRS is 200 ms. Her LV threshold is elevated.  Allergies  Allergen Reactions   Ferric Carboxymaltose Shortness Of Breath    Mild SOB following injectafer infusion Mild SOB following injectafer infusion    Penicillins Anaphylaxis    Has patient had a PCN reaction causing immediate rash, facial/tongue/throat swelling, SOB or lightheadedness with hypotension: Yes Has patient had a PCN reaction causing severe rash involving mucus membranes or skin necrosis: No Has patient had a PCN reaction that required hospitalization: No Has patient had a PCN reaction occurring within the last 10 years: No If all of the above answers are "NO", then may proceed with Cephalosporin use.    Accupril [Quinapril Hcl] Other (See Comments)    Hyperkalemia   Atorvastatin Other (See Comments)    msucle aches   Celebrex [Celecoxib]     ulcers   Fluticasone Other (See Comments)    Nasal ulcer.     Levocetirizine     Doesn't recall   Levomenol Other (See Comments)    unknown unknown    Losartan Potassium Itching   Naproxen Swelling   Nasonex [Mometasone Furoate] Other (See Comments)    nosebleed   Chamomile Other (See Comments)    unknown   Vesicare [Solifenacin] Other (See Comments)    "stopped me from urinating"   Aspirin Other (See Comments)    Pt states "burns holes in stomach", ulcers    Baking Soda-Fluoride [Sodium Fluoride] Itching   Clindamycin/Lincomycin Rash   Codeine Nausea And Vomiting   Furosemide Other (See Comments)    Nose bleed   Lactose    Lactose  Intolerance (Gi) Other (See Comments)    Increased calcium, sneezing, nasal issues   Lanolin-Petrolatum Rash   Metformin And Related Diarrhea   Miralax [Polyethylene Glycol 3350]    Miralax [Polyethylene Glycol] Rash   Nasonex [Mometasone Furoate] Other (See Comments)   Quinine Rash   Rifampin Other (See Comments)    DOES NOT REMEMBER THIS MEDICATION OR ALLERGY   Sulfa Antibiotics Rash   Sulfonamide Derivatives Rash   Tramadol Rash    Anxiety   Wellbutrin [Bupropion] Other (See Comments)    Muscle aches, pain, "made my skin crawl"   Wool Alcohol [Lanolin] Rash   Zoledronic Acid Other (See Comments) and Rash    shaking     Current Outpatient Medications  Medication Sig Dispense Refill   albuterol (PROVENTIL) (2.5 MG/3ML) 0.083% nebulizer solution Take 2.5 mg by nebulization every 6 (six) hours as needed for wheezing or shortness of breath.     albuterol (VENTOLIN HFA) 108 (90 Base) MCG/ACT inhaler Inhale 1-2 puffs into the lungs every 6 (six) hours as needed for wheezing or shortness of breath. 54 g 3   alendronate (FOSAMAX) 70 MG tablet TAKE 1 TABLET BY MOUTH  EVERY 7 DAYS WITH A FULL  GLASS OF WATER ON AN EMPTY  STOMACH 12 tablet 4   ALLEGRA-D ALLERGY & CONGESTION 60-120 MG 12 hr tablet      AMBULATORY NON FORMULARY MEDICATION Medication Name: Tubing and  supplies for nebulizer. Fax - [phone] 1 vial 0   amiodarone (PACERONE) 200 MG tablet TAKE ONE-HALF TABLET BY  MOUTH DAILY 45 tablet 2   B Complex-C (B-COMPLEX WITH VITAMIN C) tablet Take 1 tablet by mouth daily.     bumetanide (BUMEX) 1 MG tablet TAKE 1 TABLET BY MOUTH TWICE  DAILY 180 tablet 3   Calcium Citrate-Vitamin D (CITRACAL + D PO) Take 1 tablet by mouth daily.     carvedilol (COREG) 3.125 MG tablet Take 1 tablet (3.125 mg total) by mouth 2 (two) times daily. 180 tablet 3   Continuous Blood Gluc Receiver (FREESTYLE LIBRE 2 READER) DEVI by Does not apply route.     Continuous Blood Gluc Sensor (FREESTYLE LIBRE 2  SENSOR) MISC by Does not apply route.     empagliflozin (JARDIANCE) 10 MG TABS tablet Take 1 tablet (10 mg total) by mouth daily before breakfast. 30 tablet 1   Insulin Pen Needle (PEN NEEDLES) 30G X 8 MM MISC 1 Device by Does not apply route as needed. 100 each 0   levothyroxine (SYNTHROID) 88 MCG tablet TAKE 1 TABLET(88 MCG) BY MOUTH DAILY 90 tablet 0   LINZESS 72 MCG capsule Take 72 mcg by mouth 2 (two) times daily.     Multiple Vitamins-Minerals (OCUVITE EYE + MULTI) TABS Take 1 tablet by mouth daily.     pantoprazole (PROTONIX) 40 MG tablet Take 40 mg by mouth daily.     potassium chloride (KLOR-CON M) 10 MEQ tablet Take 1 tablet (10 mEq total) by mouth daily. 90 tablet 0   pravastatin (PRAVACHOL) 40 MG tablet TAKE 1 TABLET BY MOUTH AT  BEDTIME 90 tablet 3   Probiotic Product ([employer] PROBIOTIC) CAPS      Semaglutide,0.25 or 0.5MG/DOS, (OZEMPIC, 0.25 OR 0.5 MG/DOSE,) 2 MG/1.5ML SOPN Inject 0.25 mg into the skin.     valACYclovir (VALTREX) 1000 MG tablet Take 1 tablet (1,000 mg total) by mouth 2 (two) times daily. 14 tablet 2   fluticasone-salmeterol (ADVAIR HFA) 115-21 MCG/ACT inhaler Inhale 2 puffs into the lungs 2 (two) times daily.     Tiotropium Bromide Monohydrate 2.5 MCG/ACT AERS Inhale 2 puffs into the lungs daily.     No current facility-administered medications for this visit.     Past Medical History:  Diagnosis Date   Alkaline phosphatase elevation    Asthma    Breast cancer ([place]) 2011   Carpal tunnel syndrome    Carpal tunnel syndrome    bilateral   CHF (congestive heart failure) (HCC)    Cirrhosis, non-alcoholic (HCC)    Closed fracture of unspecified part of radius (alone)    DDD (degenerative disc disease), lumbar    Depression    pt denies   Diabetes mellitus without complication ([place])    Enlarged heart    Fibromyalgia    GERD (gastroesophageal reflux disease)    History of kidney stones    HTN (hypertension)    Hypothyroidism    IBS (irritable bowel syndrome)     LBBB (left bundle branch block)    Microcytic anemia 05/14/2017   Pneumonia    Presence of permanent cardiac pacemaker    PVD (peripheral vascular disease) ([place])    2019   Sleep apnea    uses cpap    ROS:   All systems reviewed and negative except as noted in the HPI.   Past Surgical History:  Procedure Laterality Date   BREAST LUMPECTOMY Left 2011   COLONOSCOPY  FOOT SURGERY     KNEE SURGERY     MASTECTOMY Right    05/2019   PACEMAKER GENERATOR CHANGE N/A 08/23/2014   Procedure: PACEMAKER GENERATOR CHANGE;  Surgeon: Evans Lance, MD;  Location[facility] C[facility] LAB;  Service: Cardiovascular;  Laterality: N/A;   PACEMAKER PLACEMENT  2009   [place] [facility]   RECTAL SURGERY  1976   REDUCTION MAMMAPLASTY Left    05/2019   TOTAL SHOULDER ARTHROPLASTY Right 03/10/2018   TOTAL SHOULDER ARTHROPLASTY Right 03/10/2018   Procedure: RIGHT TOTAL SHOULDER ARTHROPLASTY;  Surgeon: [name], MD;  Location: [facility];  Service: Orthopedics;  Laterality: Right;   TUBAL LIGATION       Family History  Problem Relation Age of Onset   Heart disease Mother    Melanoma Mother    Parkinson's disease Mother    Heart disease Father    Heart disease Brother    Heart failure Other    Breast cancer Other    Hypertension Other      Social History   Socioeconomic History   Marital status: Divorced    Spouse name: Not on file   Number of children: 2   Years of education: 16   Highest education level: Associate degree: academic program  Occupational History   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 Lives with and helps her brother. Likes Systems developer, Social worker and music. Loves animals.   Social Determinants of Health   Financial Resource Strain: Low Risk    Difficulty of Paying Living Expenses: Not hard at all  Food Insecurity: No Food Insecurity   Worried About Charity fundraiser in the Last Year: Never true   [place] in the Last Year: Never true  Transportation Needs: No Transportation Needs   Lack of Transportation (Medical): No   Lack of Transportation (Non-Medical): No  Physical Activity: Inactive   Days of Exercise per Week: 0 days   Minutes of Exercise per Session: 0 min  Stress: No Stress Concern Present   Feeling of Stress : Not at all  Social Connections: Socially Isolated   Frequency of Communication with Friends and Family: More than three times a week   Frequency of Social Gatherings with Friends and Family: Once a week   Attends Religious Services: Never   Marine scientist or Organizations: No   Attends Music therapist: Never   Marital Status: Divorced  Human resources officer Violence: Not At Risk   Fear of Current or Ex-Partner: No   Emotionally Abused: No   Physically Abused: No   Sexually Abused: No     BP 112/76    Pulse 75    Ht 5' 1"  (1.549 m)    Wt 195 lb (88.5 kg)    SpO2 96%    BMI 36.84 kg/m   Physical Exam:  Well appearing NAD HEENT: Unremarkable Neck:  No JVD, no  thyromegally Lymphatics:  No adenopathy Back:  No CVA tenderness Lungs:  Clear HEART:  Regular rate rhythm, no murmurs, no rubs, no clicks Abd:  soft, positive bowel sounds, no organomegally, no rebound, no guarding Ext:  2 plus pulses, no edema, no cyanosis, no clubbing Skin:  No rashes no nodules Neuro:  CN II through XII intact, motor grossly intact  EKG - reviewed. NSR with ventricular pacing.   DEVICE  Normal device function.  See PaceArt for details.   Assess/Plan:  Chronic  systolic heart failure - her symptoms are class 3B. She has a very wide QRS, despite pacing. I have discussed the treatment options with the patient and recommended left bundle area pacing in hopes of improving her symptoms. Her current paced QRS is 200 ms.  Biv PPM - she is approaching ERI.  3. Obesity her bmi is 20. She will need to work on this. 4. PVC's - these are stable as she is v. Paced 99%.   Carleene Overlie Galo Sayed,MD

## 2021-10-27 ENCOUNTER — Encounter: Payer: Self-pay | Admitting: Physician Assistant

## 2021-10-27 ENCOUNTER — Ambulatory Visit: Payer: Medicare Other | Admitting: Physician Assistant

## 2021-10-27 ENCOUNTER — Other Ambulatory Visit: Payer: Self-pay

## 2021-10-27 VITALS — BP 110/72 | HR 73 | Ht 61.5 in | Wt 193.2 lb

## 2021-10-27 DIAGNOSIS — I10 Essential (primary) hypertension: Secondary | ICD-10-CM | POA: Diagnosis not present

## 2021-10-27 DIAGNOSIS — R0602 Shortness of breath: Secondary | ICD-10-CM | POA: Diagnosis not present

## 2021-10-27 DIAGNOSIS — E038 Other specified hypothyroidism: Secondary | ICD-10-CM

## 2021-10-27 DIAGNOSIS — E119 Type 2 diabetes mellitus without complications: Secondary | ICD-10-CM | POA: Diagnosis not present

## 2021-10-27 DIAGNOSIS — I4729 Other ventricular tachycardia: Secondary | ICD-10-CM | POA: Diagnosis not present

## 2021-10-27 DIAGNOSIS — Z789 Other specified health status: Secondary | ICD-10-CM

## 2021-10-27 DIAGNOSIS — Z794 Long term (current) use of insulin: Secondary | ICD-10-CM | POA: Diagnosis not present

## 2021-10-27 DIAGNOSIS — I5020 Unspecified systolic (congestive) heart failure: Secondary | ICD-10-CM

## 2021-10-27 DIAGNOSIS — J439 Emphysema, unspecified: Secondary | ICD-10-CM | POA: Diagnosis not present

## 2021-10-27 MED ORDER — POTASSIUM CHLORIDE ER 10 MEQ PO TBCR: 10.0000 meq | EXTENDED_RELEASE_TABLET | ORAL | 3 refills | Status: DC

## 2021-10-27 NOTE — Patient Instructions (Addendum)
Medication Instructions:  ?Your physician has recommended you make the following change in your medication:  ? REDUCE the Potassium to every other day  ?. ? ?*If you need a refill on your cardiac medications before your next appointment, please call your pharmacy* ? ? ?Lab Work: ?None ordered ? ?If you have labs (blood work) drawn today and your tests are completely normal, you will receive your results only by: ?MyChart Message (if you have MyChart) OR ?A paper copy in the mail ?If you have any lab test that is abnormal or we need to change your treatment, we will call you to review the results. ? ? ?Testing/Procedures: ?None ordered ? ? ?Follow-Up: ?At [facility], you and your health needs are our priority.  As part of our continuing mission to provide you with exceptional heart care, we have created designated Provider Care Teams.  These Care Teams include your primary Cardiologist (physician) and Advanced Practice Providers (APPs -  Physician Assistants and Nurse Practitioners) who all work together to provide you with the care you need, when you need it. ? ?We recommend signing up for the patient portal called "MyChart".  Sign up information is provided on this After Visit Summary.  MyChart is used to connect with patients for Virtual Visits (Telemedicine).  Patients are able to view lab/test results, encounter notes, upcoming appointments, etc.  Non-urgent messages can be sent to your provider as well.   ?To learn more about what you can do with MyChart, go to [email or web address].   ? ?Your next appointment:   ?12/22/21 ARRIVE 11:30  ? ?The format for your next appointment:   ?In Person ? ?Provider:   ?Freada Bergeron, MD  ? ? ?Other Instructions ?You can take Muccinex over the counter, not the one with D in it ? ?You can take Allegra over the counter ?

## 2021-10-28 ENCOUNTER — Ambulatory Visit (INDEPENDENT_AMBULATORY_CARE_PROVIDER_SITE_OTHER): Payer: Medicare Other

## 2021-10-28 DIAGNOSIS — I442 Atrioventricular block, complete: Secondary | ICD-10-CM

## 2021-10-28 LAB — CUP PACEART REMOTE DEVICE CHECK
Battery Remaining Longevity: 7 mo
Battery Voltage: 2.85 V
Brady Statistic AP VP Percent: 28.06 %
Brady Statistic AP VS Percent: 0.01 %
Brady Statistic AS VP Percent: 71.46 %
Brady Statistic AS VS Percent: 0.47 %
Brady Statistic RA Percent Paced: 25.43 %
Brady Statistic RV Percent Paced: 99.44 %
Date Time Interrogation Session: 20230307085005
Implantable Lead Implant Date: 20030324
Implantable Lead Implant Date: 20030324
Implantable Lead Implant Date: 20030324
Implantable Lead Location: 753858
Implantable Lead Location: 753859
Implantable Lead Location: 753860
Implantable Lead Model: 4068
Implantable Lead Model: 4068
Implantable Lead Model: 4193
Implantable Pulse Generator Implant Date: 20151231
Lead Channel Impedance Value: 380 Ohm
Lead Channel Impedance Value: 399 Ohm
Lead Channel Impedance Value: 399 Ohm
Lead Channel Impedance Value: 4047 Ohm
Lead Channel Impedance Value: 4047 Ohm
Lead Channel Impedance Value: 4047 Ohm
Lead Channel Impedance Value: 551 Ohm
Lead Channel Impedance Value: 741 Ohm
Lead Channel Impedance Value: 950 Ohm
Lead Channel Pacing Threshold Amplitude: 1.375 V
Lead Channel Pacing Threshold Amplitude: 1.875 V
Lead Channel Pacing Threshold Pulse Width: 0.4 ms
Lead Channel Pacing Threshold Pulse Width: 0.4 ms
Lead Channel Sensing Intrinsic Amplitude: 1.75 mV
Lead Channel Sensing Intrinsic Amplitude: 1.75 mV
Lead Channel Sensing Intrinsic Amplitude: 13.25 mV
Lead Channel Sensing Intrinsic Amplitude: 9.875 mV
Lead Channel Setting Pacing Amplitude: 2.25 V
Lead Channel Setting Pacing Amplitude: 2.5 V
Lead Channel Setting Pacing Amplitude: 3.5 V
Lead Channel Setting Pacing Pulse Width: 0.8 ms
Lead Channel Setting Pacing Pulse Width: 0.8 ms
Lead Channel Setting Sensing Sensitivity: 4 mV

## 2021-10-30 ENCOUNTER — Other Ambulatory Visit: Payer: Self-pay | Admitting: *Deleted

## 2021-10-30 ENCOUNTER — Ambulatory Visit (INDEPENDENT_AMBULATORY_CARE_PROVIDER_SITE_OTHER): Payer: Medicare Other | Admitting: *Deleted

## 2021-10-30 DIAGNOSIS — R0602 Shortness of breath: Secondary | ICD-10-CM

## 2021-10-30 DIAGNOSIS — E785 Hyperlipidemia, unspecified: Secondary | ICD-10-CM

## 2021-10-30 DIAGNOSIS — G8929 Other chronic pain: Secondary | ICD-10-CM

## 2021-10-30 DIAGNOSIS — M797 Fibromyalgia: Secondary | ICD-10-CM

## 2021-10-30 DIAGNOSIS — M5030 Other cervical disc degeneration, unspecified cervical region: Secondary | ICD-10-CM

## 2021-10-30 DIAGNOSIS — I5022 Chronic systolic (congestive) heart failure: Secondary | ICD-10-CM

## 2021-10-30 DIAGNOSIS — N281 Cyst of kidney, acquired: Secondary | ICD-10-CM | POA: Diagnosis not present

## 2021-10-30 DIAGNOSIS — R932 Abnormal findings on diagnostic imaging of liver and biliary tract: Secondary | ICD-10-CM | POA: Diagnosis not present

## 2021-10-30 DIAGNOSIS — K7581 Nonalcoholic steatohepatitis (NASH): Secondary | ICD-10-CM | POA: Diagnosis not present

## 2021-10-30 DIAGNOSIS — E118 Type 2 diabetes mellitus with unspecified complications: Secondary | ICD-10-CM

## 2021-10-30 DIAGNOSIS — Z950 Presence of cardiac pacemaker: Secondary | ICD-10-CM

## 2021-10-30 DIAGNOSIS — R609 Edema, unspecified: Secondary | ICD-10-CM

## 2021-10-30 DIAGNOSIS — K828 Other specified diseases of gallbladder: Secondary | ICD-10-CM | POA: Diagnosis not present

## 2021-10-30 DIAGNOSIS — M19072 Primary osteoarthritis, left ankle and foot: Secondary | ICD-10-CM

## 2021-10-30 DIAGNOSIS — R5381 Other malaise: Secondary | ICD-10-CM

## 2021-10-30 DIAGNOSIS — R42 Dizziness and giddiness: Secondary | ICD-10-CM

## 2021-10-30 DIAGNOSIS — K8020 Calculus of gallbladder without cholecystitis without obstruction: Secondary | ICD-10-CM | POA: Diagnosis not present

## 2021-10-30 MED ORDER — PEN NEEDLES 30G X 8 MM MISC: 1.0000 | 6 refills | Status: DC | PRN

## 2021-10-31 ENCOUNTER — Telehealth: Payer: Self-pay | Admitting: Family Medicine

## 2021-10-31 ENCOUNTER — Telehealth: Payer: Medicare Other

## 2021-10-31 NOTE — Telephone Encounter (Signed)
Immunizations added.  ?

## 2021-10-31 NOTE — Telephone Encounter (Signed)
Patient dropped off vaccine print out from pharmacy. Papers placed in [place] box - lmr. ?

## 2021-10-31 NOTE — Patient Instructions (Signed)
Visit Information ? ?Thank you for taking time to visit with me today. Please don't hesitate to contact me if I can be of assistance to you before our next scheduled telephone appointment. ? ?Following are the goals we discussed today:  ?Patient Goals/Self-Care Activities: ?Contact [employer] [phone]), to request financial assistance through their Emergency Assistance Program, to help pay for rent and utilities. ? ~ Not eligible for Rent and Utility Assistance. ?[place] (# [phone]), Adult Medicaid Division, to inquire about eligibility for Adult Medicaid coverage. ?~ Not eligible for Adult Medicaid. ?[place] (# [phone]), Food Stamps Division, to inquire about eligibility for [employer]. ? ~ Not eligible for Food Stamps. ?Contact [employer] of [place] [phone]), to request community health and human service resources.  ? ~ Not eligible for [employer]. ?Secondary school teacher of [place] [phone]), to request budget counseling, rental assistance, and utility assistance. ?Complete application for [facility], and submit to [facility] Department for processing. ? ~ Working with Development worker, community with [employer]. ?Review additional list of community agencies and resources offering financial assistance in [facility], emailed to you on 10/31/2021. ?Contact LCSW directly (# [phone]), if you have questions, need assistance, or if additional social work needs are identified between now and our next scheduled telephone outreach call.     ?Follow-Up Date:  11/13/2021 at 9:15 am ? ?Please call the care guide team at [phone] if you need to cancel or reschedule your appointment.  ? ?If you are experiencing a Mental Health or [place] or need  someone to talk to, please call the Suicide and Crisis Lifeline: 988 ?call the [place] National Suicide Prevention Lifeline: 604-613-5400 or TTY: (970) 491-9966 TTY 9122616399) to talk to a trained counselor ?call 1-800-273-TALK (toll free, 24 hour hotline) ?go to [facility] [address] [place] [phone]) ?call the [facility]: [phone] ?call 911  ? ?Patient verbalizes understanding of instructions and care plan provided today and agrees to view in [place]. Active MyChart status confirmed with patient.   ? ?Nat Christen LCSW ?Licensed Clinical Social Worker ?[place] ?[phone]  ?

## 2021-10-31 NOTE — Telephone Encounter (Signed)
Pls abstact if not already done ?

## 2021-10-31 NOTE — Chronic Care Management (AMB) (Signed)
Chronic Care Management    Clinical Social Work Note  10/31/2021 Name: Tanya Hogan MRN: [id number] DOB: 11/04/1950  Tanya Hogan is a 71 y.o. year old female who is a primary care patient of [name], Rene Kocher, MD. The CCM team was consulted to assist the patient with chronic disease management and/or care coordination needs related to: [employer] and Financial Difficulties.   Engaged with patient by telephone for follow up visit in response to provider referral for social work chronic care management and care coordination services.   Consent to Services:  The patient was given information about Chronic Care Management services, agreed to services, and gave verbal consent prior to initiation of services.  Please see initial visit note for detailed documentation.   Patient agreed to services and consent obtained.   Assessment: Review of patient past medical history, allergies, medications, and health status, including review of relevant consultants reports was performed today as part of a comprehensive evaluation and provision of chronic care management and care coordination services.     SDOH (Social Determinants of Health) assessments and interventions performed:    Advanced Directives Status: Not addressed in this encounter.  CCM Care Plan  Allergies  Allergen Reactions   Ferric Carboxymaltose Shortness Of Breath    Mild SOB following injectafer infusion Mild SOB following injectafer infusion    Penicillins Anaphylaxis    Has patient had a PCN reaction causing immediate rash, facial/tongue/throat swelling, SOB or lightheadedness with hypotension: Yes Has patient had a PCN reaction causing severe rash involving mucus membranes or skin necrosis: No Has patient had a PCN reaction that required hospitalization: No Has patient had a PCN reaction occurring within the last 10 years: No If all of the above answers are "NO", then may proceed with Cephalosporin use.     Accupril [Quinapril Hcl] Other (See Comments)    Hyperkalemia   Atorvastatin Other (See Comments)    msucle aches   Celebrex [Celecoxib]     ulcers   Fluticasone Other (See Comments)    Nasal ulcer.     Levocetirizine     Doesn't recall   Levomenol Other (See Comments)    unknown unknown    Losartan Potassium Itching   Naproxen Swelling   Nasonex [Mometasone Furoate] Other (See Comments)    nosebleed   Chamomile Other (See Comments)    unknown   Vesicare [Solifenacin] Other (See Comments)    "stopped me from urinating"   Aspirin Other (See Comments)    Pt states "burns holes in stomach", ulcers    Baking Soda-Fluoride [Sodium Fluoride] Itching and Rash   Clindamycin/Lincomycin Rash   Codeine Nausea And Vomiting   Furosemide Other (See Comments)    Nose bleed   Lactose Intolerance (Gi) Other (See Comments)    Increased calcium, sneezing, nasal issues   Lanolin-Petrolatum Rash   Metformin And Related Diarrhea   Miralax [Polyethylene Glycol] Rash   Quinine Rash   Rifampin Other (See Comments)    DOES NOT REMEMBER THIS MEDICATION OR ALLERGY   Sulfa Antibiotics Rash   Sulfonamide Derivatives Rash   Tramadol Rash    Anxiety   Wellbutrin [Bupropion] Other (See Comments)    Muscle aches, pain, "made my skin crawl"   Wool Alcohol [Lanolin] Rash   Zoledronic Acid Other (See Comments) and Rash    shaking    Outpatient Encounter Medications as of 10/30/2021  Medication Sig   acetaminophen (TYLENOL) 500 MG tablet Take 500-1,000 mg by mouth every  6 (six) hours as needed (for pain.).   albuterol (PROVENTIL) (2.5 MG/3ML) 0.083% nebulizer solution Take 2.5 mg by nebulization every 6 (six) hours as needed for wheezing or shortness of breath.   albuterol (VENTOLIN HFA) 108 (90 Base) MCG/ACT inhaler Inhale 1-2 puffs into the lungs every 6 (six) hours as needed for wheezing or shortness of breath.   alendronate (FOSAMAX) 70 MG tablet TAKE 1 TABLET BY MOUTH  EVERY 7 DAYS WITH A FULL   GLASS OF WATER ON AN EMPTY  STOMACH (Patient taking differently: Take 70 mg by mouth every Saturday.)   AMBULATORY NON FORMULARY MEDICATION Medication Name: Tubing and supplies for nebulizer. Fax - [phone]   amiodarone (PACERONE) 200 MG tablet TAKE ONE-HALF TABLET BY  MOUTH DAILY   B Complex-C (B-COMPLEX WITH VITAMIN C) tablet Take 1 tablet by mouth in the morning.   bumetanide (BUMEX) 1 MG tablet TAKE 1 TABLET BY MOUTH TWICE  DAILY   carvedilol (COREG) 3.125 MG tablet Take 1 tablet (3.125 mg total) by mouth 2 (two) times daily.   Continuous Blood Gluc Receiver (FREESTYLE LIBRE 2 READER) DEVI by Does not apply route.   Continuous Blood Gluc Sensor (FREESTYLE LIBRE 2 SENSOR) MISC by Does not apply route.   empagliflozin (JARDIANCE) 10 MG TABS tablet Take 1 tablet (10 mg total) by mouth daily before breakfast.   fexofenadine (ALLEGRA) 180 MG tablet Take 180 mg by mouth in the morning.   fluticasone-salmeterol (ADVAIR HFA) 115-21 MCG/ACT inhaler Inhale 2 puffs into the lungs 2 (two) times daily.   Insulin Pen Needle (PEN NEEDLES) 30G X 8 MM MISC 1 Device by Does not apply route as needed.   levothyroxine (SYNTHROID) 88 MCG tablet TAKE 1 TABLET(88 MCG) BY MOUTH DAILY   LINZESS 72 MCG capsule Take 72 mcg by mouth 2 (two) times daily as needed (constipation.).   Multiple Vitamins-Minerals (OCUVITE EYE + MULTI) TABS Take 1 tablet by mouth in the morning.   pantoprazole (PROTONIX) 40 MG tablet Take 40 mg by mouth in the morning.   PAPAYA ENZYME PO Take 1 capsule by mouth in the morning.   potassium chloride (KLOR-CON) 10 MEQ tablet Take 1 tablet (10 mEq total) by mouth every other day.   pravastatin (PRAVACHOL) 40 MG tablet TAKE 1 TABLET BY MOUTH AT  BEDTIME   Probiotic Product ([employer] PROBIOTIC) CAPS Take 2 capsules by mouth in the morning.   Semaglutide,0.25 or 0.5MG/DOS, (OZEMPIC, 0.25 OR 0.5 MG/DOSE,) 2 MG/1.5ML SOPN Inject 0.25 mg into the skin every Saturday.   Tiotropium Bromide Monohydrate  (SPIRIVA RESPIMAT) 1.25 MCG/ACT AERS Inhale 2 puffs into the lungs daily.   valACYclovir (VALTREX) 1000 MG tablet Take 1 tablet (1,000 mg total) by mouth 2 (two) times daily. (Patient taking differently: Take 1,000 mg by mouth 2 (two) times daily as needed (fever blisters).)   No facility-administered encounter medications on file as of 10/30/2021.    Patient Active Problem List   Diagnosis Date Noted   Complete heart block ([place]) 10/24/2021   BMI 37.0-37.9, adult 10/14/2021   Osteoporosis 02/28/2021   Primary osteoarthritis of left knee 01/14/2021   Irritable bowel syndrome with constipation 09/16/2020   Aortic atherosclerosis ([place]) 06/11/2020   Benign lipomatous neoplasm of skin and subcutaneous tissue of left leg 06/11/2020   Paroxysmal VT 02/14/2020   Chronic constipation 10/06/2019   Moderate persistent asthma with exacerbation 08/29/2019   Genetic testing 08/14/2019   Ductal carcinoma in situ (DCIS) of right breast 06/15/2019   Arthralgia 04/11/2019  Malignant tumor of breast ([place]) 03/06/2019   Fatigue 03/06/2019   Status post total shoulder arthroplasty, right 03/10/2018   DDD (degenerative disc disease), cervical 11/19/2017   Bradycardia with less than 60 beats per minute 11/02/2017   Peripheral edema 05/19/2017   Microcytic anemia 05/14/2017   Delayed sleep phase syndrome 10/29/2016   Primary osteoarthritis of left ankle 09/24/2016   Gastrointestinal food sensitivity 05/21/2016   History of arthroplasty of right shoulder 02/24/2016   Bilateral foot pain 01/22/2016   NASH (nonalcoholic steatohepatitis) 05/26/2015    Class: Stage 3   Nasal septal perforation 07/11/2014   Controlled diabetes mellitus type 2 with complications ([place]) 07/68/0881   Physical deconditioning 04/24/2014   Obesity, Class II, BMI 35-39.9, with comorbidity 06/09/2013   OSA (obstructive sleep apnea) 06/23/5944   Chronic systolic heart failure ([place]) 05/23/2013   Asthma, moderate persistent 04/18/2013    Chronic cough 04/18/2013   Breast cancer of upper-inner quadrant of left female breast ([place]) 03/18/2010   Hyperlipidemia 11/07/2009   Fibromyalgia 10/29/2009   Biventricular cardiac pacemaker in situ 10/23/2009   ALKALINE PHOSPHATASE, ELEVATED 10/13/2009   Hypothyroidism 10/03/2009   Benign essential hypertension 07/02/2009   Chronic back pain 07/02/2009    Conditions to be addressed/monitored: Financial Insecurities, Fibromyalgia, and Chronic Back Pain.  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  Care Plan : LCSW Plan of Care  Updates made by Francis Gaines, LCSW since 10/31/2021 12:00 AM     Problem: Find Help in My Community.   Priority: High     Goal: Find Help in My Community.   Start Date: 10/16/2021  Expected End Date: 01/13/2022  This Visit's Progress: On track  Recent Progress: On track  Priority: High  Note:   Current Barriers:   Financial constraints related to limited/fixed income. Lacks knowledge of available community agencies and resources in [place]. Clinical Goals:  Patient will work with LCSW to address needs related to financial insecurities and inability to pay outstanding medical expenses.   Interventions: Collaboration with Primary Care Physician, Dr. Beatrice Lecher, regarding development and update of comprehensive plan of care, as evidenced by provider attestation and co-signature. Inter-disciplinary care team collaboration (see longitudinal plan of care). Clinical Interventions: Reviewed various resources and discussed options with patient. Other Clinical Interventions: Solution-Focused Strategies implemented. Active Listening/Reflection utilized. Emotional Support provided, and Verbalization of Feelings encouraged.   Problem Solving/Task-Centered Skills developed. Cognitive Behavioral Therapy performed. Client-Centered Therapy initiated.   Patient Goals/Self-Care Activities: Contact [employer] [phone]), to request financial assistance through their Emergency Assistance Program, to help pay for rent and utilities.  ~ Not eligible for Rent and Utility Assistance. [place] (# [phone]), Adult Medicaid Division, to inquire about eligibility for Adult Medicaid coverage. ~ Not eligible for Adult Medicaid. [place] (# [phone]), Food Stamps Division, to inquire about eligibility for [employer].  ~ Not eligible for [employer]. Contact [employer] of [place] [phone]), to request community health and human service resources.   ~ Not eligible for [employer]. Secondary school teacher of [place] [phone]), to request budget counseling, rental assistance, and utility assistance. Complete application for [facility], and submit to [facility] Department for processing.  ~ Working with Development worker, community with [facility]. Review additional list of community agencies and resources offering financial assistance in [facility], emailed to you on  10/31/2021. Contact LCSW directly (# [phone]), if you have questions, need assistance, or if additional social work needs are identified between now and our next scheduled telephone outreach call.     Follow-Up Date:  11/13/2021 at 9:15 am   Nat Christen [place] Clinical Social Worker [place] [phone]

## 2021-10-31 NOTE — Pre-Procedure Instructions (Signed)
Instructed patient on the following items: ?Arrival time 0530 ?Nothing to eat or drink after midnight ?No meds AM of procedure, except the ones they told you to take Monday morning. ?Responsible person to drive you home and stay with you for 24 hrs ?Wash with special soap night before and morning of procedure ? ?

## 2021-11-03 ENCOUNTER — Other Ambulatory Visit: Payer: Self-pay

## 2021-11-03 ENCOUNTER — Ambulatory Visit (HOSPITAL_COMMUNITY): Payer: Medicare Other

## 2021-11-03 ENCOUNTER — Ambulatory Visit (HOSPITAL_COMMUNITY)
Admission: RE | Admit: 2021-11-03 | Discharge: 2021-11-03 | Disposition: A | Discharge status: Home / Self Care | Payer: Medicare Other | Attending: Internal Medicine | Admitting: Internal Medicine

## 2021-11-03 ENCOUNTER — Ambulatory Visit (HOSPITAL_COMMUNITY)
Admission: RE | Disposition: A | Discharge status: Home / Self Care | Payer: Self-pay | Source: Home / Self Care | Attending: Internal Medicine

## 2021-11-03 ENCOUNTER — Encounter (HOSPITAL_COMMUNITY): Payer: Self-pay | Admitting: Internal Medicine

## 2021-11-03 DIAGNOSIS — E1151 Type 2 diabetes mellitus with diabetic peripheral angiopathy without gangrene: Secondary | ICD-10-CM | POA: Diagnosis not present

## 2021-11-03 DIAGNOSIS — Z87891 Personal history of nicotine dependence: Secondary | ICD-10-CM | POA: Diagnosis not present

## 2021-11-03 DIAGNOSIS — E669 Obesity, unspecified: Secondary | ICD-10-CM | POA: Insufficient documentation

## 2021-11-03 DIAGNOSIS — Z7985 Long-term (current) use of injectable non-insulin antidiabetic drugs: Secondary | ICD-10-CM | POA: Insufficient documentation

## 2021-11-03 DIAGNOSIS — I110 Hypertensive heart disease with heart failure: Secondary | ICD-10-CM | POA: Insufficient documentation

## 2021-11-03 DIAGNOSIS — Z79899 Other long term (current) drug therapy: Secondary | ICD-10-CM | POA: Insufficient documentation

## 2021-11-03 DIAGNOSIS — I442 Atrioventricular block, complete: Secondary | ICD-10-CM | POA: Diagnosis not present

## 2021-11-03 DIAGNOSIS — Z45010 Encounter for checking and testing of cardiac pacemaker pulse generator [battery]: Secondary | ICD-10-CM | POA: Insufficient documentation

## 2021-11-03 DIAGNOSIS — Z853 Personal history of malignant neoplasm of breast: Secondary | ICD-10-CM | POA: Insufficient documentation

## 2021-11-03 DIAGNOSIS — Z6837 Body mass index (BMI) 37.0-37.9, adult: Secondary | ICD-10-CM | POA: Insufficient documentation

## 2021-11-03 DIAGNOSIS — I5022 Chronic systolic (congestive) heart failure: Secondary | ICD-10-CM | POA: Diagnosis not present

## 2021-11-03 DIAGNOSIS — K7460 Unspecified cirrhosis of liver: Secondary | ICD-10-CM | POA: Insufficient documentation

## 2021-11-03 DIAGNOSIS — I517 Cardiomegaly: Secondary | ICD-10-CM | POA: Diagnosis not present

## 2021-11-03 DIAGNOSIS — Z950 Presence of cardiac pacemaker: Secondary | ICD-10-CM

## 2021-11-03 DIAGNOSIS — E039 Hypothyroidism, unspecified: Secondary | ICD-10-CM | POA: Insufficient documentation

## 2021-11-03 DIAGNOSIS — Z79890 Hormone replacement therapy (postmenopausal): Secondary | ICD-10-CM | POA: Insufficient documentation

## 2021-11-03 DIAGNOSIS — Z7984 Long term (current) use of oral hypoglycemic drugs: Secondary | ICD-10-CM | POA: Diagnosis not present

## 2021-11-03 HISTORY — PX: BIV PACEMAKER GENERATOR CHANGEOUT: EP1198

## 2021-11-03 HISTORY — PX: LEAD REVISION/REPAIR: EP1213

## 2021-11-03 LAB — GLUCOSE, CAPILLARY
Glucose-Capillary: 116 mg/dL — ABNORMAL HIGH (ref 70–99)
Glucose-Capillary: 113 mg/dL — ABNORMAL HIGH (ref 70–99)

## 2021-11-03 SURGERY — BIV PACEMAKER GENERATOR CHANGEOUT

## 2021-11-03 MED ORDER — FENTANYL CITRATE (PF) 100 MCG/2ML IJ SOLN
INTRAMUSCULAR | Status: AC
  Filled 2021-11-03: qty 2

## 2021-11-03 MED ORDER — SODIUM CHLORIDE 0.9 % IV SOLN
80.0000 mg | INTRAVENOUS | Status: AC
  Administered 2021-11-03: 80 mg

## 2021-11-03 MED ORDER — LIDOCAINE HCL 1 % IJ SOLN
INTRAMUSCULAR | Status: AC
  Filled 2021-11-03: qty 20

## 2021-11-03 MED ORDER — ALBUTEROL SULFATE HFA 108 (90 BASE) MCG/ACT IN AERS
2.0000 | INHALATION_SPRAY | RESPIRATORY_TRACT | Status: DC
  Administered 2021-11-03: 2 via RESPIRATORY_TRACT
  Filled 2021-11-03 (×2): qty 6.7

## 2021-11-03 MED ORDER — FENTANYL CITRATE (PF) 100 MCG/2ML IJ SOLN
INTRAMUSCULAR | Status: DC | PRN
  Administered 2021-11-03 (×3): 12.5 ug via INTRAVENOUS

## 2021-11-03 MED ORDER — HEPARIN (PORCINE) IN NACL 2-0.9 UNITS/ML
INTRAMUSCULAR | Status: DC | PRN
Start: 2021-11-03 — End: 2021-11-03
  Administered 2021-11-03: 500 mL

## 2021-11-03 MED ORDER — ACETAMINOPHEN 325 MG PO TABS
325.0000 mg | ORAL_TABLET | ORAL | Status: DC | PRN
  Administered 2021-11-03: 650 mg via ORAL
  Filled 2021-11-03: qty 2

## 2021-11-03 MED ORDER — LIDOCAINE HCL (PF) 1 % IJ SOLN
INTRAMUSCULAR | Status: DC | PRN
  Administered 2021-11-03: 60 mL

## 2021-11-03 MED ORDER — VANCOMYCIN HCL IN DEXTROSE 1-5 GM/200ML-% IV SOLN
INTRAVENOUS | Status: AC
  Filled 2021-11-03: qty 200

## 2021-11-03 MED ORDER — POVIDONE-IODINE 10 % EX SWAB: 2.0000 "application " | Freq: Once | CUTANEOUS | Status: DC

## 2021-11-03 MED ORDER — MIDAZOLAM HCL 5 MG/5ML IJ SOLN
INTRAMUSCULAR | Status: AC
  Filled 2021-11-03: qty 5

## 2021-11-03 MED ORDER — VANCOMYCIN HCL IN DEXTROSE 1-5 GM/200ML-% IV SOLN: 1000.0000 mg | Freq: Once | INTRAVENOUS | Status: DC

## 2021-11-03 MED ORDER — CHLORHEXIDINE GLUCONATE 4 % EX LIQD: 4.0000 "application " | Freq: Once | CUTANEOUS | Status: DC

## 2021-11-03 MED ORDER — ALBUTEROL SULFATE (2.5 MG/3ML) 0.083% IN NEBU
INHALATION_SOLUTION | RESPIRATORY_TRACT | Status: AC
  Filled 2021-11-03: qty 3

## 2021-11-03 MED ORDER — SODIUM CHLORIDE 0.9 % IV SOLN
INTRAVENOUS | Status: AC
  Filled 2021-11-03: qty 2

## 2021-11-03 MED ORDER — ONDANSETRON HCL 4 MG/2ML IJ SOLN: 4.0000 mg | Freq: Four times a day (QID) | INTRAMUSCULAR | Status: DC | PRN

## 2021-11-03 MED ORDER — MIDAZOLAM HCL 5 MG/5ML IJ SOLN
INTRAMUSCULAR | Status: DC | PRN
  Administered 2021-11-03 (×3): 1 mg via INTRAVENOUS

## 2021-11-03 MED ORDER — VANCOMYCIN HCL IN DEXTROSE 1-5 GM/200ML-% IV SOLN
1000.0000 mg | INTRAVENOUS | Status: AC
  Administered 2021-11-03: 1000 mg via INTRAVENOUS

## 2021-11-03 MED ORDER — FUROSEMIDE 10 MG/ML IJ SOLN
40.0000 mg | Freq: Once | INTRAMUSCULAR | Status: AC
  Administered 2021-11-03: 40 mg via INTRAVENOUS
  Filled 2021-11-03: qty 4

## 2021-11-03 MED ORDER — SODIUM CHLORIDE 0.9 % IV SOLN: INTRAVENOUS | Status: DC

## 2021-11-03 MED ORDER — HEPARIN (PORCINE) IN NACL 1000-0.9 UT/500ML-% IV SOLN
INTRAVENOUS | Status: AC
  Filled 2021-11-03: qty 500

## 2021-11-03 SURGICAL SUPPLY — 17 items
CABLE SURGICAL S-101-97-12 (CABLE) ×3 IMPLANT
CATH RIGHTSITE C315HIS02 (CATHETERS) ×2 IMPLANT
KIT LEAD END CAP (Cap) IMPLANT
KIT MICROPUNCTURE NIT STIFF (SHEATH) ×2 IMPLANT
LEAD END CAP (Cap) ×2 IMPLANT
LEAD SELECT SECURE 3830 383069 (Lead) IMPLANT
MAT PREVALON FULL STRYKER (MISCELLANEOUS) ×2 IMPLANT
PACEMAKER PRCT MRI CRTP W1TR01 (Pacemaker) IMPLANT
PAD DEFIB RADIO PHYSIO CONN (PAD) ×3 IMPLANT
POUCH AIGIS-R ANTIBACT PPM (Mesh General) ×3 IMPLANT
POUCH AIGIS-R ANTIBACT PPM MED (Mesh General) IMPLANT
PPM PRECEPTA MRI CRT-P W1TR01 (Pacemaker) ×3 IMPLANT
SELECT SECURE 3830 383069 (Lead) ×3 IMPLANT
SHEATH 7FR PRELUDE SNAP 13 (SHEATH) ×2 IMPLANT
SLITTER 6232ADJ (MISCELLANEOUS) ×2 IMPLANT
TRAY PACEMAKER INSERTION (PACKS) ×3 IMPLANT
WIRE HI TORQ VERSACORE-J 145CM (WIRE) ×2 IMPLANT

## 2021-11-03 NOTE — Discharge Instructions (Addendum)
After Your Pacemaker ? ? ?You have a Medtronic Pacemaker ? ?ACTIVITY ?Do not lift your arm above shoulder height for 1 week after your procedure. After 7 days, you may progress as below.  ?You should remove your sling 24 hours after your procedure, unless otherwise instructed by your provider.  ? ? ? Monday November 10, 2021  Tuesday November 11, 2021 Wednesday November 12, 2021 Thursday November 13, 2021  ? ?Do not lift, push, pull, or carry anything over 10 pounds with the affected arm until 6 weeks (Monday December 15, 2021 ) after your procedure.  ? ?You may drive AFTER your wound check, unless you have been told otherwise by your provider.  ? ?Ask your healthcare provider when you can go back to work ? ? ?INCISION/Dressing ?If you are on a blood thinner such as Coumadin, Xarelto, Eliquis, Plavix, or Pradaxa please confirm with your provider when this should be resumed.  ? ?If large square, outer bandage is left in place, this can be removed after 24 hours from your procedure. Do not remove steri-strips or glue as below.  ? ?Monitor your Pacemaker site for redness, swelling, and drainage. Call the device clinic at [phone] if you experience these symptoms or fever/chills. ? ?If your incision is sealed with Steri-strips or staples, you may shower 10 days after your procedure or when told by your provider. Do not remove the steri-strips or let the shower hit directly on your site. You may wash around your site with soap and water.   ? ?If you were discharged in a sling, please do not wear this during the day more than 48 hours after your surgery unless otherwise instructed. This may increase the risk of stiffness and soreness in your shoulder.  ? ?Avoid lotions, ointments, or perfumes over your incision until it is well-healed. ? ?You may use a hot tub or a pool AFTER your wound check appointment if the incision is completely closed. ? ?Pacemaker Alerts:  Some alerts are vibratory and others beep. These are NOT emergencies.  Please call our office to let us know. If this occurs at night or on weekends, it can wait until the next business day. Send a remote transmission. ? ?If your device is capable of reading fluid status (for heart failure), you will be offered monthly monitoring to review this with you.  ? ?DEVICE MANAGEMENT ?Remote monitoring is used to monitor your pacemaker from home. This monitoring is scheduled every 91 days by our office. It allows [place] to keep an eye on the functioning of your device to ensure it is working properly. You will routinely see your Electrophysiologist annually (more often if necessary).  ? ?You should receive your ID card for your new device in 4-8 weeks. Keep this card with you at all times once received. Consider wearing a medical alert bracelet or necklace. ? ?Your Pacemaker may be MRI compatible. This will be discussed at your next office visit/wound check.  You should avoid contact with strong electric or magnetic fields.  ? ?Do not use amateur (ham) radio equipment or electric (arc) welding torches. MP3 player headphones with magnets should not be used. Some devices are safe to use if held at least 12 inches (30 cm) from your Pacemaker. These include power tools, lawn mowers, and speakers. If you are unsure if something is safe to use, ask your health care provider. ? ?When using your cell phone, hold it to the ear that is on the opposite side from the  Pacemaker. Do not leave your cell phone in a pocket over the Pacemaker. ? ?You may safely use electric blankets, heating pads, computers, and microwave ovens. ? ?Call the office right away if: ?You have chest pain. ?You feel more short of breath than you have felt before. ?You feel more light-headed than you have felt before. ?Your incision starts to open up. ? ?This information is not intended to replace advice given to you by your health care provider. Make sure you discuss any questions you have with your health care provider.  ?

## 2021-11-03 NOTE — Interval H&P Note (Signed)
History and Physical Interval Note: ? ?11/03/2021 ?8:12 AM ? ?Tanya Hogan  has presented today for surgery, with the diagnosis of cardiomyopathy, vt.  The various methods of treatment have been discussed with the patient and family. After consideration of risks, benefits and other options for treatment, the patient has consented to  Procedure(s): ?BIV PACEMAKER GENERATOR CHANGEOUT (N/A) and insertion of a new left bundle area lead as a surgical intervention.  The patient's history has been reviewed, patient examined, no change in status, stable for surgery.  I have reviewed the patient's chart and labs.  Questions were answered to the patient's satisfaction.   ? ? ?Tanya Hogan ? ? ?

## 2021-11-03 NOTE — Progress Notes (Signed)
Patient requesting her "inhaler" because she feels as if she is having an asthma flare up. States she uses her albuterol about twice a week and her current symptoms of chest tightness and cough feel the same as her usual asthma flares. Dr. Lovena Le and Joseph Art PA-C made aware. Orders given for asthma inhaler 2 puffs from Dr. Lovena Le. Patient agrees to this plan.  ?

## 2021-11-03 NOTE — Progress Notes (Signed)
Renee PA-C in to see client and informed client Dr Lovena Le wants her to have some lasix and client states had nose bleed after lasix before ?

## 2021-11-04 ENCOUNTER — Telehealth: Payer: Self-pay | Admitting: Cardiology

## 2021-11-04 ENCOUNTER — Encounter (HOSPITAL_COMMUNITY): Payer: Self-pay | Admitting: Internal Medicine

## 2021-11-04 MED FILL — Lidocaine HCl Local Inj 1%: INTRAMUSCULAR | Qty: 60 | Status: AC

## 2021-11-04 MED FILL — Gentamicin Sulfate Inj 40 MG/ML: INTRAMUSCULAR | Qty: 80 | Status: AC

## 2021-11-04 MED FILL — Vancomycin HCl IV Soln 1000 MG/200ML (Base Equivalent): INTRAVENOUS | Qty: 200 | Status: AC

## 2021-11-04 NOTE — Telephone Encounter (Signed)
Pt stating she got a pacemaker yesterday and went home uneventful. ? ?Today evening, she is reporting that she heard the pacemaker making sound (bells) and stopped. She also felt dizzy. ? - I asked her to check her BP: was 130/99. Her pacemaker stopped making sounds. I reviewed her CXR and she has Left bundle pacing, RV lead is capped. She has Chronic systolic HF, baseline LBBB, EF 20% ? ? -I have advised her to seek medical attention if she continues to feel dizzy/lightheaded or ppm starts making sounds. We need to make sure leads are not misplaced- get CXR, device interrogation (medtronic BIV). ?

## 2021-11-05 ENCOUNTER — Telehealth: Payer: Self-pay

## 2021-11-05 NOTE — Telephone Encounter (Addendum)
Follow-up after same day discharge: ?Implant date: 11/03/21 ?MD: Cristopher Peru, MD ?Device: MDT CRT-P with new 3830 left bundle lead ?Location: left chest ? ? ?Wound check visit: 11/13/21 at 2:00 ?90 day MD follow-up: 02/20/22 at 3:45 ? ?Remote Transmission received:yes ? ?Dressing removed: will be confirmed at call back ? ?Unsuccessful telephone encounter to patient to follow up post CRT-P gen change with new 3830 left bundle lead. [employer] compliant VM message left requesting call back to [phone]. ? ?Addendum 11/05/21 @ 10:45 ?Patient returning call. Confirms outer dressing was removed yesterday. No drainage, bleeding, or swelling noted. Admits to moderate pain. Taking tylenol every 4 to 6 hours with relief. Advised patient may use ice pack for comfort. Reinforced arm restrictions. Patient has removed sling. Confirmed appointments as above. Provided patient with device clinic contact for additional questions or concerns.  ? ?

## 2021-11-05 NOTE — Telephone Encounter (Signed)
-----   Message from [place], [place] sent at 11/03/2021  3:11 PM EDT ----- ?Same day d/c ? ?MDT ?Upgrade to CRT (left bundle lead) ? ?GT ? ?

## 2021-11-10 ENCOUNTER — Other Ambulatory Visit: Payer: Self-pay

## 2021-11-10 ENCOUNTER — Telehealth: Payer: Self-pay

## 2021-11-10 ENCOUNTER — Encounter: Payer: Self-pay | Admitting: Internal Medicine

## 2021-11-10 DIAGNOSIS — E118 Type 2 diabetes mellitus with unspecified complications: Secondary | ICD-10-CM

## 2021-11-10 MED ORDER — PEN NEEDLES 30G X 8 MM MISC: 1.0000 | 6 refills | Status: DC | PRN

## 2021-11-10 NOTE — Telephone Encounter (Signed)
Patient called in stating her ppm is making a beeping noise inside of her chest. I have walked patient through on how to send a transmission and it has sent successfully  ?

## 2021-11-10 NOTE — Telephone Encounter (Signed)
Returned patients phone number.  Patient reports she had episode of fatigue and needed to sit down in her chair. States she exerted herself today but overall had a good day.  ? ?Patient reports her device made a beeping noise today. Remote transmission received and reviewed, presenting is NSR. 1 NSVT event logged, 9 beats in duration. Patient advised her device does not beep or vibrate. I confirmed with Med[employer] rep [place]. Patient insist it was her device that beeped and she was told by Dr. Lovena Le she had a ICD implanted. I attempted to answer patients questions and offered to have [employer] rep call to discuss specifics about device. States she trust Dr. Lovena Le and would like for him to call her back. Advised I would forward to Dr. Lovena Le. ? ? ? ? ? ?

## 2021-11-10 NOTE — Progress Notes (Signed)
Remote pacemaker transmission.   

## 2021-11-11 NOTE — Telephone Encounter (Signed)
She has a biv pacemaker. Not sure about the beeping. Keep followup appointments. GT ?

## 2021-11-13 ENCOUNTER — Ambulatory Visit: Payer: Medicare Other | Admitting: *Deleted

## 2021-11-13 ENCOUNTER — Ambulatory Visit (INDEPENDENT_AMBULATORY_CARE_PROVIDER_SITE_OTHER): Payer: Medicare Other

## 2021-11-13 ENCOUNTER — Encounter: Payer: Self-pay | Admitting: *Deleted

## 2021-11-13 ENCOUNTER — Other Ambulatory Visit: Payer: Self-pay

## 2021-11-13 DIAGNOSIS — I442 Atrioventricular block, complete: Secondary | ICD-10-CM

## 2021-11-13 DIAGNOSIS — E118 Type 2 diabetes mellitus with unspecified complications: Secondary | ICD-10-CM

## 2021-11-13 DIAGNOSIS — M797 Fibromyalgia: Secondary | ICD-10-CM

## 2021-11-13 DIAGNOSIS — I5022 Chronic systolic (congestive) heart failure: Secondary | ICD-10-CM

## 2021-11-13 DIAGNOSIS — Z950 Presence of cardiac pacemaker: Secondary | ICD-10-CM

## 2021-11-13 DIAGNOSIS — M5030 Other cervical disc degeneration, unspecified cervical region: Secondary | ICD-10-CM

## 2021-11-13 DIAGNOSIS — R5381 Other malaise: Secondary | ICD-10-CM

## 2021-11-13 DIAGNOSIS — K7581 Nonalcoholic steatohepatitis (NASH): Secondary | ICD-10-CM

## 2021-11-13 LAB — CUP PACEART INCLINIC DEVICE CHECK
Battery Remaining Longevity: 82 mo
Battery Voltage: 3.18 V
Brady Statistic AP VP Percent: 39.43 %
Brady Statistic AP VS Percent: 0.03 %
Brady Statistic AS VP Percent: 58.96 %
Brady Statistic AS VS Percent: 1.58 %
Brady Statistic RA Percent Paced: 40.42 %
Brady Statistic RV Percent Paced: 98.39 %
Date Time Interrogation Session: 20230323170910
Implantable Lead Implant Date: 20030324
Implantable Lead Implant Date: 20030324
Implantable Lead Implant Date: 20230313
Implantable Lead Location: 753858
Implantable Lead Location: 753859
Implantable Lead Location: 753860
Implantable Lead Model: 3830
Implantable Lead Model: 4068
Implantable Lead Model: 4193
Implantable Pulse Generator Implant Date: 20230313
Lead Channel Impedance Value: 3306 Ohm
Lead Channel Impedance Value: 3306 Ohm
Lead Channel Impedance Value: 3306 Ohm
Lead Channel Impedance Value: 342 Ohm
Lead Channel Impedance Value: 399 Ohm
Lead Channel Impedance Value: 418 Ohm
Lead Channel Impedance Value: 570 Ohm
Lead Channel Impedance Value: 798 Ohm
Lead Channel Impedance Value: 969 Ohm
Lead Channel Pacing Threshold Amplitude: 0.5 V
Lead Channel Pacing Threshold Amplitude: 1.5 V
Lead Channel Pacing Threshold Amplitude: 1.875 V
Lead Channel Pacing Threshold Pulse Width: 0.4 ms
Lead Channel Pacing Threshold Pulse Width: 0.4 ms
Lead Channel Pacing Threshold Pulse Width: 0.8 ms
Lead Channel Sensing Intrinsic Amplitude: 15.125 mV
Lead Channel Sensing Intrinsic Amplitude: 2 mV
Lead Channel Setting Pacing Amplitude: 2.5 V
Lead Channel Setting Pacing Amplitude: 3 V
Lead Channel Setting Pacing Amplitude: 3.5 V
Lead Channel Setting Pacing Pulse Width: 0.6 ms
Lead Channel Setting Pacing Pulse Width: 0.8 ms
Lead Channel Setting Sensing Sensitivity: 2.8 mV

## 2021-11-13 NOTE — Patient Instructions (Signed)
   After Your Pacemaker   Monitor your pacemaker site for redness, swelling, and drainage. Call the device clinic at [phone] if you experience these symptoms or fever/chills.  Your incision was closed with Steri-strips or staples:  You may shower 7 days after your procedure and wash your incision with soap and water. Avoid lotions, ointments, or perfumes over your incision until it is well-healed.  You may use a hot tub or a pool after your wound check appointment if the incision is completely closed.  Do not lift, push or pull greater than 10 pounds with the affected arm until 6 weeks after your procedure. There are no other restrictions in arm movement after your wound check appointment.  You may drive, unless driving has been restricted by your healthcare providers.  Remote monitoring is used to monitor your pacemaker from home. This monitoring is scheduled every 91 days by our office. It allows us to keep an eye on the functioning of your device to ensure it is working properly. You will routinely see your Electrophysiologist annually (more often if necessary).  

## 2021-11-13 NOTE — Chronic Care Management (AMB) (Signed)
?Chronic Care Management  ? ? Clinical Social Work Note ? ?11/13/2021 ?Name: Tanya Hogan MRN: [id number] DOB: 1950-10-24 ? ?Tanya Hogan is a 71 y.o. year old female who is a primary care patient of [name], Rene Kocher, MD. The CCM team was consulted to assist the patient with chronic disease management and/or care coordination needs related to: [employer] and Financial Difficulties.  ? ?Engaged with patient by telephone for follow up visit in response to provider referral for social work chronic care management and care coordination services.  ? ?Consent to Services:  ?The patient was given information about Chronic Care Management services, agreed to services, and gave verbal consent prior to initiation of services.  Please see initial visit note for detailed documentation.  ? ?Patient agreed to services and consent obtained.  ? ?Assessment: Review of patient past medical history, allergies, medications, and health status, including review of relevant consultants reports was performed today as part of a comprehensive evaluation and provision of chronic care management and care coordination services.    ? ?SDOH (Social Determinants of Health) assessments and interventions performed:   ? ?Advanced Directives Status: Not addressed in this encounter. ? ?CCM Care Plan ? ?Allergies  ?Allergen Reactions  ? Ferric Carboxymaltose Shortness Of Breath  ?  Mild SOB following injectafer infusion ?Mild SOB following injectafer infusion ?  ? Penicillins Anaphylaxis  ?  Has patient had a PCN reaction causing immediate rash, facial/tongue/throat swelling, SOB or lightheadedness with hypotension: Yes ?Has patient had a PCN reaction causing severe rash involving mucus membranes or skin necrosis: No ?Has patient had a PCN reaction that required hospitalization: No ?Has patient had a PCN reaction occurring within the last 10 years: No ?If all of the above answers are "NO", then may proceed with Cephalosporin use. ?  ?  Accupril [Quinapril Hcl] Other (See Comments)  ?  Hyperkalemia  ? Atorvastatin Other (See Comments)  ?  msucle aches  ? Celebrex [Celecoxib]   ?  ulcers  ? Fluticasone Other (See Comments)  ?  Nasal ulcer.    ? Levocetirizine   ?  Doesn't recall  ? Levomenol Other (See Comments)  ?  unknown ?unknown ?  ? Losartan Potassium Itching  ? Naproxen Swelling  ? Nasonex [Mometasone Furoate] Other (See Comments)  ?  nosebleed  ? Chamomile Other (See Comments)  ?  unknown  ? Vesicare [Solifenacin] Other (See Comments)  ?  "stopped me from urinating"  ? Aspirin Other (See Comments)  ?  Pt states "burns holes in stomach", ulcers   ? Baking Soda-Fluoride [Sodium Fluoride] Itching and Rash  ? Clindamycin/Lincomycin Rash  ? Codeine Nausea And Vomiting  ? Furosemide Other (See Comments)  ?  Nose bleed  ? Lactose Intolerance (Gi) Other (See Comments)  ?  Increased calcium, sneezing, nasal issues  ? Lanolin-Petrolatum Rash  ? Metformin And Related Diarrhea  ? Miralax [Polyethylene Glycol] Rash  ? Quinine Rash  ? Rifampin Other (See Comments)  ?  DOES NOT REMEMBER THIS MEDICATION OR ALLERGY  ? Sulfa Antibiotics Rash  ? Sulfonamide Derivatives Rash  ? Tramadol Rash  ?  Anxiety  ? Wellbutrin [Bupropion] Other (See Comments)  ?  Muscle aches, pain, "made my skin crawl"  ? Wool Alcohol [Lanolin] Rash  ? Zoledronic Acid Other (See Comments) and Rash  ?  shaking  ? ? ?Outpatient Encounter Medications as of 11/13/2021  ?Medication Sig  ? acetaminophen (TYLENOL) 500 MG tablet Take 500-1,000 mg by mouth every 6 (  six) hours as needed (for pain.).  ? albuterol (PROVENTIL) (2.5 MG/3ML) 0.083% nebulizer solution Take 2.5 mg by nebulization every 6 (six) hours as needed for wheezing or shortness of breath.  ? albuterol (VENTOLIN HFA) 108 (90 Base) MCG/ACT inhaler Inhale 1-2 puffs into the lungs every 6 (six) hours as needed for wheezing or shortness of breath.  ? alendronate (FOSAMAX) 70 MG tablet TAKE 1 TABLET BY MOUTH  EVERY 7 DAYS WITH A FULL   GLASS OF WATER ON AN EMPTY  STOMACH (Patient taking differently: Take 70 mg by mouth every Saturday.)  ? AMBULATORY NON FORMULARY MEDICATION Medication Name: Tubing and supplies for nebulizer. Fax - [phone]  ? amiodarone (PACERONE) 200 MG tablet TAKE ONE-HALF TABLET BY  MOUTH DAILY  ? B Complex-C (B-COMPLEX WITH VITAMIN C) tablet Take 1 tablet by mouth in the morning.  ? bumetanide (BUMEX) 1 MG tablet TAKE 1 TABLET BY MOUTH TWICE  DAILY  ? carvedilol (COREG) 3.125 MG tablet Take 1 tablet (3.125 mg total) by mouth 2 (two) times daily.  ? Continuous Blood Gluc Receiver (FREESTYLE LIBRE 2 READER) DEVI by Does not apply route.  ? Continuous Blood Gluc Sensor (FREESTYLE LIBRE 2 SENSOR) MISC by Does not apply route.  ? empagliflozin (JARDIANCE) 10 MG TABS tablet Take 1 tablet (10 mg total) by mouth daily before breakfast.  ? fexofenadine (ALLEGRA) 180 MG tablet Take 180 mg by mouth in the morning.  ? fluticasone-salmeterol (ADVAIR HFA) 115-21 MCG/ACT inhaler Inhale 2 puffs into the lungs 2 (two) times daily.  ? Insulin Pen Needle (PEN NEEDLES) 30G X 8 MM MISC 1 Dose by Does not apply route as needed. Use as directed. Check blood sugar once a day. Dx code E11.8  ? levothyroxine (SYNTHROID) 88 MCG tablet TAKE 1 TABLET(88 MCG) BY MOUTH DAILY  ? LINZESS 72 MCG capsule Take 72 mcg by mouth 2 (two) times daily as needed (constipation.).  ? Multiple Vitamins-Minerals (OCUVITE EYE + MULTI) TABS Take 1 tablet by mouth in the morning.  ? pantoprazole (PROTONIX) 40 MG tablet Take 40 mg by mouth in the morning.  ? PAPAYA ENZYME PO Take 1 capsule by mouth in the morning.  ? potassium chloride (KLOR-CON) 10 MEQ tablet Take 1 tablet (10 mEq total) by mouth every other day.  ? pravastatin (PRAVACHOL) 40 MG tablet TAKE 1 TABLET BY MOUTH AT  BEDTIME  ? Probiotic Product ([employer] PROBIOTIC) CAPS Take 2 capsules by mouth in the morning.  ? Semaglutide,0.25 or 0.5MG/DOS, (OZEMPIC, 0.25 OR 0.5 MG/DOSE,) 2 MG/1.5ML SOPN Inject 0.25 mg into  the skin every Saturday.  ? Tiotropium Bromide Monohydrate (SPIRIVA RESPIMAT) 1.25 MCG/ACT AERS Inhale 2 puffs into the lungs daily.  ? valACYclovir (VALTREX) 1000 MG tablet Take 1 tablet (1,000 mg total) by mouth 2 (two) times daily. (Patient taking differently: Take 1,000 mg by mouth 2 (two) times daily as needed (fever blisters).)  ? ?No facility-administered encounter medications on file as of 11/13/2021.  ? ? ?Patient Active Problem List  ? Diagnosis Date Noted  ? Complete heart block ([place]) 10/24/2021  ? BMI 37.0-37.9, adult 10/14/2021  ? Osteoporosis 02/28/2021  ? Primary osteoarthritis of left knee 01/14/2021  ? Irritable bowel syndrome with constipation 09/16/2020  ? Aortic atherosclerosis ([place]) 06/11/2020  ? Benign lipomatous neoplasm of skin and subcutaneous tissue of left leg 06/11/2020  ? Paroxysmal VT 02/14/2020  ? Chronic constipation 10/06/2019  ? Moderate persistent asthma with exacerbation 08/29/2019  ? Genetic testing 08/14/2019  ? Ductal carcinoma  in situ (DCIS) of right breast 06/15/2019  ? Arthralgia 04/11/2019  ? Malignant tumor of breast ([place]) 03/06/2019  ? Fatigue 03/06/2019  ? Status post total shoulder arthroplasty, right 03/10/2018  ? DDD (degenerative disc disease), cervical 11/19/2017  ? Bradycardia with less than 60 beats per minute 11/02/2017  ? Peripheral edema 05/19/2017  ? Microcytic anemia 05/14/2017  ? Delayed sleep phase syndrome 10/29/2016  ? Primary osteoarthritis of left ankle 09/24/2016  ? Gastrointestinal food sensitivity 05/21/2016  ? History of arthroplasty of right shoulder 02/24/2016  ? Bilateral foot pain 01/22/2016  ? NASH (nonalcoholic steatohepatitis) 05/26/2015  ?  Class: Stage 3  ? Nasal septal perforation 07/11/2014  ? Controlled diabetes mellitus type 2 with complications ([place]) 06/25/1116  ? Physical deconditioning 04/24/2014  ? Obesity, Class II, BMI 35-39.9, with comorbidity 06/09/2013  ? OSA (obstructive sleep apnea) 06/08/2013  ? Chronic systolic heart failure  ([place]) 05/23/2013  ? Asthma, moderate persistent 04/18/2013  ? Chronic cough 04/18/2013  ? Breast cancer of upper-inner quadrant of left female breast ([place]) 03/18/2010  ? Hyperlipidemia 11/07/2009  ? Fibr

## 2021-11-13 NOTE — Patient Instructions (Signed)
Visit Information ? ?Thank you for taking time to visit with me today. Please don't hesitate to contact me if I can be of assistance to you before our next scheduled telephone appointment. ? ?Following are the goals we discussed today:  ?Patient Goals/Self-Care Activities: ?Contact LCSW directly (# [phone]), if you have questions, need assistance, or if additional social work needs are identified in the near future.      ?No Follow-Up Date Required. ? ?Please call the care guide team at [phone] if you need to cancel or reschedule your appointment.  ? ?If you are experiencing a Mental Health or [place] or need someone to talk to, please call the Suicide and Crisis Lifeline: 988 ?call the [place] National Suicide Prevention Lifeline: 639-862-8101 or TTY: 717-453-8204 TTY (423)045-2536) to talk to a trained counselor ?call 1-800-273-TALK (toll free, 24 hour hotline) ?go to [facility] [address] [place] ([phone]) ?call the [facility]: [phone] ?call 911  ? ?Patient verbalizes understanding of instructions and care plan provided today and agrees to view in [place]. Active MyChart status confirmed with patient.   ? ?Nat Christen LCSW ?Licensed Clinical Social Worker ?[place] ?[phone]  ?

## 2021-11-13 NOTE — Progress Notes (Signed)
Wound check appointment. Steri-strips removed. Wound without redness or edema. Incision edges approximated, wound well healed. Normal device function. Thresholds, sensing, and impedances consistent with implant measurements. RA output programmed to 3.5V to allow 2:1 safety margin. Device programmed at 3.5V/auto capture programmed on for extra safety margin until 3 month visit. Histogram distribution appropriate for patient and level of activity. AT/AF burden <0.1%, no EGM' available. 1 NSVT event logged, <20 beats.  Patient educated about wound care, arm mobility, lifting restrictions. ROV in 3 months with implanting physician. ?

## 2021-11-14 DIAGNOSIS — C50912 Malignant neoplasm of unspecified site of left female breast: Secondary | ICD-10-CM | POA: Diagnosis not present

## 2021-11-20 ENCOUNTER — Other Ambulatory Visit: Payer: Self-pay | Admitting: Family Medicine

## 2021-11-20 DIAGNOSIS — G4737 Central sleep apnea in conditions classified elsewhere: Secondary | ICD-10-CM | POA: Diagnosis not present

## 2021-11-20 DIAGNOSIS — I5022 Chronic systolic (congestive) heart failure: Secondary | ICD-10-CM

## 2021-11-20 DIAGNOSIS — G4733 Obstructive sleep apnea (adult) (pediatric): Secondary | ICD-10-CM | POA: Diagnosis not present

## 2021-11-21 DIAGNOSIS — M19072 Primary osteoarthritis, left ankle and foot: Secondary | ICD-10-CM

## 2021-11-21 DIAGNOSIS — E1169 Type 2 diabetes mellitus with other specified complication: Secondary | ICD-10-CM

## 2021-11-21 DIAGNOSIS — E118 Type 2 diabetes mellitus with unspecified complications: Secondary | ICD-10-CM

## 2021-11-21 DIAGNOSIS — E785 Hyperlipidemia, unspecified: Secondary | ICD-10-CM

## 2021-11-21 DIAGNOSIS — I5022 Chronic systolic (congestive) heart failure: Secondary | ICD-10-CM

## 2021-11-24 DIAGNOSIS — R040 Epistaxis: Secondary | ICD-10-CM | POA: Diagnosis not present

## 2021-12-01 ENCOUNTER — Encounter: Payer: Medicare Other | Admitting: Internal Medicine

## 2021-12-03 ENCOUNTER — Telehealth: Payer: Self-pay | Admitting: *Deleted

## 2021-12-03 NOTE — Telephone Encounter (Signed)
Pt called and advised that her Ozempic is here and she can pick this up at her convenience.  ?

## 2021-12-04 ENCOUNTER — Telehealth: Payer: Self-pay | Admitting: Family Medicine

## 2021-12-04 ENCOUNTER — Telehealth: Payer: Self-pay | Admitting: Internal Medicine

## 2021-12-04 NOTE — Telephone Encounter (Signed)
Patient is due for a mammogram. She recently has a pace maker implant she wants to make sure it's okay to have one done.   ?

## 2021-12-04 NOTE — Telephone Encounter (Signed)
Patient came in to office and stated that she needs the papers completed for pre approval for [place] 2 SENSOR with [facility] [employer] [place] [address] Phone:[phone]. ?Patient stated when she called for her refill she was informed that this approval need to be completed by 12/12/2021 or she will not get a new refill - lmr. ?

## 2021-12-04 NOTE — Telephone Encounter (Signed)
Successful telephone encounter to patient to discuss need for mammogram. Patient states she presented to her routein mammogram appointment today "not thinking about my pacemaker" and was told she needed to reschedule. Discuss with patient she needed to wait 6 weeks from implant if possible because of arm positioning during process. Patient has a history of bilateral breast cancer. She advised new appointment was made for [facility] May. This will be well beyond her 6 weeks post implant restrictions. Patient appreciative of follow up.  ?

## 2021-12-10 DIAGNOSIS — L602 Onychogryphosis: Secondary | ICD-10-CM | POA: Diagnosis not present

## 2021-12-10 DIAGNOSIS — L84 Corns and callosities: Secondary | ICD-10-CM | POA: Diagnosis not present

## 2021-12-10 DIAGNOSIS — M79671 Pain in right foot: Secondary | ICD-10-CM | POA: Diagnosis not present

## 2021-12-10 DIAGNOSIS — E119 Type 2 diabetes mellitus without complications: Secondary | ICD-10-CM | POA: Diagnosis not present

## 2021-12-10 DIAGNOSIS — M79672 Pain in left foot: Secondary | ICD-10-CM | POA: Diagnosis not present

## 2021-12-15 ENCOUNTER — Other Ambulatory Visit: Payer: Self-pay | Admitting: Family Medicine

## 2021-12-16 NOTE — Progress Notes (Deleted)
?Cardiology Office Note:   ? ?Date:  12/16/2021  ? ?ID:  Tanya Hogan, DOB 26-Apr-1951, MRN [id number] ? ?PCP:  Hali Marry, MD ?  ?[facility] Providers ?Cardiologist:  Freada Bergeron, MD { ? ? ?Referring MD: Hali Marry, *  ? ? ?History of Present Illness:   ? ?Tanya Hogan is a 71 y.o. female with a hx of chronic systolic heart failure with LVEF 35-40%>20% on TTE 08/2021, NICM, CHB s/p BiV PPM placement s/p recent gent change in 19/5093, nonalcoholic cirrhosis, DMII HTN, HLD, PVD, and breast cancer s/p mastectomy who presents to clinic for follow-up. ? ?Per review of the record, the patient has a known history of NICM with cath in 01/2020 at OSH with normal coronaries. TTE 01/2020 with LVEF 35-40%.  Was admitted in the ED at [facility] in 08/2021 for decompensated HF with BNP 4700. She was started on bumex BID. Repeat TTE with EF 20%. She was started on spiro and jardiance at that time. ? ?Saw Nicholes Rough in 09/2021 where she had low energy. She stopped the spiro with improvement. She was also on ozempic. Has not tolerated ARB due to itching. ? ?She underwent generator change with Dr. Lovena Le in 10/2021. ? ?Today, *** ? ?Past Medical History:  ?Diagnosis Date  ? Alkaline phosphatase elevation   ? Asthma   ? Breast cancer [facility]) 2011  ? Carpal tunnel syndrome   ? Carpal tunnel syndrome   ? bilateral  ? CHF (congestive heart failure) ([place])   ? Cirrhosis, non-alcoholic ([place])   ? Closed fracture of unspecified part of radius (alone)   ? DDD (degenerative disc disease), lumbar   ? Depression   ? pt denies  ? Diabetes mellitus without complication ([place])   ? Enlarged heart   ? Fibromyalgia   ? GERD (gastroesophageal reflux disease)   ? History of kidney stones   ? HTN (hypertension)   ? Hypothyroidism   ? IBS (irritable bowel syndrome)   ? LBBB (left bundle branch block)   ? Microcytic anemia 05/14/2017  ? Pneumonia   ? Presence of permanent cardiac pacemaker   ? PVD (peripheral vascular  disease) ([place])   ? 2019  ? Sleep apnea   ? uses cpap  ? ? ?Past Surgical History:  ?Procedure Laterality Date  ? [place] N/A 11/03/2021  ? Procedure: BIV PACEMAKER GENERATOR CHANGEOUT;  Surgeon: Evans Lance, MD;  Location: [place] CV LAB;  Service: Cardiovascular;  Laterality: N/A;  ? BREAST LUMPECTOMY Left 2011  ? COLONOSCOPY    ? FOOT SURGERY    ? KNEE SURGERY    ? LEAD REVISION/REPAIR N/A 11/03/2021  ? Procedure: LEAD REVISION/REPAIR;  Surgeon: Evans Lance, MD;  Location: [place] [facility];  Service: Cardiovascular;  Laterality: N/A;  ? MASTECTOMY Right   ? 05/2019  ? PACEMAKER GENERATOR CHANGE N/A 08/23/2014  ? Procedure: PACEMAKER GENERATOR CHANGE;  Surgeon: Evans Lance, MD;  Location: [facility] CATH LAB;  Service: Cardiovascular;  Laterality: N/A;  ? PACEMAKER PLACEMENT  2009  ? [facility] cardiology  ? RECTAL SURGERY  1976  ? REDUCTION MAMMAPLASTY Left   ? 05/2019  ? TOTAL SHOULDER ARTHROPLASTY Right 03/10/2018  ? TOTAL SHOULDER ARTHROPLASTY Right 03/10/2018  ? Procedure: RIGHT TOTAL SHOULDER ARTHROPLASTY;  Surgeon: [name], MD;  Location: [facility];  Service: Orthopedics;  Laterality: Right;  ? TUBAL LIGATION    ? ? ?Current Medications: ?No outpatient medications have been marked as  taking for the 12/22/21 encounter (Appointment) with Freada Bergeron, MD.  ?  ? ?Allergies:   Ferric carboxymaltose, Penicillins, Accupril [quinapril hcl], Atorvastatin, Celebrex [celecoxib], Fluticasone, Levocetirizine, Levomenol, Losartan potassium, Naproxen, Nasonex [mometasone furoate], Chamomile, Vesicare [solifenacin], Aspirin, Baking soda-fluoride [sodium fluoride], Clindamycin/lincomycin, Codeine, Furosemide, Lactose intolerance (gi), Lanolin-petrolatum, Metformin and related, Miralax [polyethylene glycol], Quinine, Rifampin, Sulfa antibiotics, Sulfonamide derivatives, Tramadol, Wellbutrin [bupropion], Wool alcohol [lanolin], and Zoledronic acid  ? ?Social History  ? ?Socioeconomic  History  ? Marital status: Divorced  ?  Spouse name: Not on file  ? Number of children: 2  ? Years of education: 101  ? Highest education level: Associate degree: academic program  ?Occupational History  ? Occupation: quality insurcance in production line  ?  Comment: retired  ?Tobacco Use  ? Smoking status: Former  ?  Packs/day: 0.00  ?  Years: 0.00  ?  Pack years: 0.00  ?  Types: Cigarettes  ?  Quit date: 08/24/1966  ?  Years since quitting: 55.3  ? Smokeless tobacco: Never  ?Vaping Use  ? Vaping Use: Never used  ?Substance and Sexual Activity  ? Alcohol use: No  ? Drug use: No  ? Sexual activity: Not Currently  ?Other Topics Concern  ? Not on file  ?Social History Narrative  ? Retired. Lives with and helps her brother. Likes Systems developer, Social worker and music. Loves animals.  ? ?Social Determinants of Health  ? ?Financial Resource Strain: Low Risk   ? Difficulty of Paying Living Expenses: Not hard at all  ?Food Insecurity: No Food Insecurity  ? Worried About Charity fundraiser in the Last Year: Never true  ? Ran Out of Food in the Last Year: Never true  ?Transportation Needs: No Transportation Needs  ? Lack of Transportation (Medical): No  ? Lack of Transportation (Non-Medical): No  ?Physical Activity: Inactive  ? Days of Exercise per Week: 0 days  ? Minutes of Exercise per Session: 0 min  ?Stress: No Stress Concern Present  ? Feeling of Stress : Not at all  ?Social Connections: Socially Isolated  ? Frequency of Communication with Friends and Family: More than three times a week  ? Frequency of Social Gatherings with Friends and Family: Once a week  ? Attends Religious Services: Never  ? Active Member of Clubs or Organizations: No  ? Attends Archivist Meetings: Never  ? Marital Status: Divorced  ?  ? ?Family History: ?The patient's ***family history includes Breast cancer in an other family member; Heart disease in her brother, father, and mother; Heart failure in an other family member;  Hypertension in an other family member; Melanoma in her mother; Parkinson's disease in her mother. ? ?ROS:   ?Please see the history of present illness.    ?*** All other systems reviewed and are negative. ? ?EKGs/Labs/Other Studies Reviewed:   ? ?The following studies were reviewed today: ?TTE 08/2021 ([facility]): ?Left Ventricle  ?Left ventricle is mildly dilated. Wall thickness is normal. Systolic function is severely abnormal. EF: 20 %. Wall motion is normal.  ? ?Right Ventricle  ?Right ventricle is mildly dilated. Systolic function is moderately reduced. Abnormal tricuspid annular plane systolic excursion (TAPSE) <1.7 cm.  ? ?Left Atrium  ?Left atrium is mildly dilated.  ? ?Right Atrium  ?Right atrium size is normal.  ? ?IVC/SVC  ?The inferior vena cava demonstrates a diameter of >2.1 cm and collapses >50%; therefore, the right atrial pressure is estimated at 8 mmHg.  ? ?Mitral Valve  ?  Mitral valve structure is normal. There is mild regurgitation with a centrally directed jet.  ? ?Tricuspid Valve  ?Tricuspid valve structure is normal. There is trace regurgitation. The right ventricular systolic pressure is mildly elevated (37-49 mmHg).  ? ?Aortic Valve  ?The aortic valve was not well visualized.  ? ?Ascending Aorta  ?The aortic root is normal in size. The ascending aorta is normal in size.  ? ?Pericardium  ?There is no pericardial effusion.  ? ?Study Details  ?A complete echo was performed using complete 2D, color flow Doppler and spectral Doppler. During the study the apical, parasternal and subcostal views were captured. Overall the study quality was adequate. The study was technically difficult. The study was difficult due to patient's clinical status and body habitus. ? ?EKG:  EKG is *** ordered today.  The ekg ordered today demonstrates *** ? ?Recent Labs: ?04/16/2021: ALT 18 ?08/14/2021: TSH 2.54 ?10/24/2021: BUN 15; Creatinine, Ser 1.08; Hemoglobin 15.1; Platelets 243; Potassium 5.1; Sodium 142  ?Recent Lipid  Panel ?   ?Component Value Date/Time  ? CHOL 168 04/16/2021 0000  ? TRIG 120 04/16/2021 0000  ? HDL 73 04/16/2021 0000  ? CHOLHDL 2.3 04/16/2021 0000  ? VLDL 26 02/18/2017 0939  ? [place] 74 04/16/2021 0000  ? ? ? ?

## 2021-12-18 DIAGNOSIS — E118 Type 2 diabetes mellitus with unspecified complications: Secondary | ICD-10-CM | POA: Diagnosis not present

## 2021-12-22 ENCOUNTER — Other Ambulatory Visit: Payer: Self-pay | Admitting: Internal Medicine

## 2021-12-22 ENCOUNTER — Encounter: Payer: Self-pay | Admitting: Cardiology

## 2021-12-22 ENCOUNTER — Ambulatory Visit: Payer: Medicare Other | Admitting: Cardiology

## 2021-12-22 VITALS — BP 112/76 | HR 101 | Ht 61.0 in | Wt 191.2 lb

## 2021-12-22 DIAGNOSIS — Z950 Presence of cardiac pacemaker: Secondary | ICD-10-CM | POA: Diagnosis not present

## 2021-12-22 DIAGNOSIS — I509 Heart failure, unspecified: Secondary | ICD-10-CM | POA: Diagnosis not present

## 2021-12-22 DIAGNOSIS — Z794 Long term (current) use of insulin: Secondary | ICD-10-CM

## 2021-12-22 DIAGNOSIS — I442 Atrioventricular block, complete: Secondary | ICD-10-CM

## 2021-12-22 DIAGNOSIS — E119 Type 2 diabetes mellitus without complications: Secondary | ICD-10-CM | POA: Diagnosis not present

## 2021-12-22 DIAGNOSIS — I10 Essential (primary) hypertension: Secondary | ICD-10-CM | POA: Diagnosis not present

## 2021-12-22 DIAGNOSIS — I5020 Unspecified systolic (congestive) heart failure: Secondary | ICD-10-CM

## 2021-12-22 LAB — BASIC METABOLIC PANEL
BUN/Creatinine Ratio: 20 (ref 12–28)
BUN: 18 mg/dL (ref 8–27)
CO2: 28 mmol/L (ref 20–29)
Calcium: 10.5 mg/dL — ABNORMAL HIGH (ref 8.7–10.3)
Chloride: 100 mmol/L (ref 96–106)
Creatinine, Ser: 0.92 mg/dL (ref 0.57–1.00)
Glucose: 94 mg/dL (ref 70–99)
Potassium: 4.9 mmol/L (ref 3.5–5.2)
Sodium: 140 mmol/L (ref 134–144)
eGFR: 66 mL/min/{1.73_m2} (ref >59)

## 2021-12-22 NOTE — Patient Instructions (Signed)
Medication Instructions:  ? ?Your physician recommends that you continue on your current medications as directed. Please refer to the Current Medication list given to you today. ? ?*If you need a refill on your cardiac medications before your next appointment, please call your pharmacy* ? ? ?Lab Work: ? ?TODAY--BMET ? ?If you have labs (blood work) drawn today and your tests are completely normal, you will receive your results only by: ?MyChart Message (if you have MyChart) OR ?A paper copy in the mail ?If you have any lab test that is abnormal or we need to change your treatment, we will call you to review the results. ? ? ?Testing/Procedures: ? ?Your physician has requested that you have an LIMITED echocardiogram. Echocardiography is a painless test that uses sound waves to create images of your heart. It provides your doctor with information about the size and shape of your heart and how well your heart?s chambers and valves are working. This procedure takes approximately one hour. There are no restrictions for this procedure. ? ? ?Follow-Up: ?At [facility], you and your health needs are our priority.  As part of our continuing mission to provide you with exceptional heart care, we have created designated Provider Care Teams.  These Care Teams include your primary Cardiologist (physician) and Advanced Practice Providers (APPs -  Physician Assistants and Nurse Practitioners) who all work together to provide you with the care you need, when you need it. ? ?We recommend signing up for the patient portal called "MyChart".  Sign up information is provided on this After Visit Summary.  MyChart is used to connect with patients for Virtual Visits (Telemedicine).  Patients are able to view lab/test results, encounter notes, upcoming appointments, etc.  Non-urgent messages can be sent to your provider as well.   ?To learn more about what you can do with MyChart, go to [email or web address].   ? ?Your next  appointment:   ?6 month(s) ? ?The format for your next appointment:   ?In Person ? ?Provider:   ?Freada Bergeron, MD { ? ?Important Information About Sugar ? ? ? ? ? ? ?
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
## 2021-12-26 ENCOUNTER — Encounter (HOSPITAL_COMMUNITY): Payer: Self-pay

## 2021-12-26 ENCOUNTER — Telehealth (HOSPITAL_COMMUNITY): Payer: Self-pay

## 2021-12-26 NOTE — Telephone Encounter (Signed)
Pt insurance is active and benefits verified through [facility]. Co-pay $0.00, DED $0.00/$0.00 met, out of pocket $3,600.00/$711.57 met, co-insurance 0%. No pre-authorization required. Passport, 12/26/21 @ 9:20AM, ZJQ#[id number] ?  ?Will contact patient to see if she is interested in the Cardiac Rehab Program. ?

## 2021-12-26 NOTE — Telephone Encounter (Signed)
Attempted to call patient in regards to Cardiac Rehab - LM on VM Mailed letter 

## 2022-01-01 ENCOUNTER — Telehealth: Payer: Self-pay | Admitting: Cardiology

## 2022-01-01 ENCOUNTER — Encounter (HOSPITAL_COMMUNITY): Payer: Self-pay

## 2022-01-01 ENCOUNTER — Encounter (HOSPITAL_COMMUNITY)
Admission: RE | Admit: 2022-01-01 | Discharge: 2022-01-01 | Disposition: A | Discharge status: Home / Self Care | Payer: Medicare Other | Source: Ambulatory Visit | Attending: Cardiology | Admitting: Cardiology

## 2022-01-01 ENCOUNTER — Telehealth (HOSPITAL_COMMUNITY): Payer: Self-pay | Admitting: *Deleted

## 2022-01-01 VITALS — BP 104/72 | HR 62 | Ht 61.0 in | Wt 194.9 lb

## 2022-01-01 DIAGNOSIS — E739 Lactose intolerance, unspecified: Secondary | ICD-10-CM | POA: Insufficient documentation

## 2022-01-01 DIAGNOSIS — Z713 Dietary counseling and surveillance: Secondary | ICD-10-CM | POA: Insufficient documentation

## 2022-01-01 DIAGNOSIS — G4733 Obstructive sleep apnea (adult) (pediatric): Secondary | ICD-10-CM | POA: Diagnosis not present

## 2022-01-01 DIAGNOSIS — I110 Hypertensive heart disease with heart failure: Secondary | ICD-10-CM | POA: Diagnosis not present

## 2022-01-01 DIAGNOSIS — Z6836 Body mass index (BMI) 36.0-36.9, adult: Secondary | ICD-10-CM | POA: Insufficient documentation

## 2022-01-01 DIAGNOSIS — E118 Type 2 diabetes mellitus with unspecified complications: Secondary | ICD-10-CM | POA: Diagnosis not present

## 2022-01-01 DIAGNOSIS — E039 Hypothyroidism, unspecified: Secondary | ICD-10-CM | POA: Insufficient documentation

## 2022-01-01 DIAGNOSIS — K7581 Nonalcoholic steatohepatitis (NASH): Secondary | ICD-10-CM | POA: Insufficient documentation

## 2022-01-01 DIAGNOSIS — I5022 Chronic systolic (congestive) heart failure: Secondary | ICD-10-CM | POA: Diagnosis not present

## 2022-01-01 DIAGNOSIS — E669 Obesity, unspecified: Secondary | ICD-10-CM | POA: Insufficient documentation

## 2022-01-01 DIAGNOSIS — I700 Atherosclerosis of aorta: Secondary | ICD-10-CM | POA: Insufficient documentation

## 2022-01-01 DIAGNOSIS — I442 Atrioventricular block, complete: Secondary | ICD-10-CM | POA: Diagnosis not present

## 2022-01-01 LAB — GLUCOSE, CAPILLARY: Glucose-Capillary: 107 mg/dL — ABNORMAL HIGH (ref 70–99)
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
## 2022-01-01 NOTE — Telephone Encounter (Signed)
Tanya Hogan was at cardiac rehab orientation today and became dizzy, she did take an extra dose of bumex yesterday she was pacing and BP lying 118/75 sitting and standing 110/74  sp02 of 96%  she kept walking and she became dizzy again BP sitting 114/68 standing 118/64.   ? ?I felt she should continue to exercise and monitor.  Will notify Dr. Johney Frame of this as well ?
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
## 2022-01-01 NOTE — Progress Notes (Signed)
Cardiac Rehab Medication Review by a Nurse ? ?Does the patient  feel that his/her medications are working for him/her?  yes ? ?Has the patient been experiencing any side effects to the medications prescribed?  yes ? ?Does the patient measure his/her own blood pressure or blood glucose at home?  yes  ? ?Does the patient have any problems obtaining medications due to transportation or finances?   no ? ?Understanding of regimen: good ?Understanding of indications: excellent ?Potential of compliance: excellent ? ? ? ?Nurse comments: Tanya Hogan is taking her medications as prescribed and has a good understanding of what her medications are for. Tanya Hogan reports that she is experiencing hair loss from ozempic. Tanya Hogan has a CGM which she is not wearing currently. Tanya Hogan checks her blood sugars and CBG's twice a day. ? ? ? ?Harrell Gave RN ?01/01/2022 11:52 AM ?  ?

## 2022-01-01 NOTE — Progress Notes (Signed)
Patient reported feeling lightheaded twice during orientation. Tanya Hogan reports that she took an extra bumex yesterday and lost 4 pounds overnight. Patient reports that she ate breakfast had coffee and drank 8 ounces of water before coming to orientation.  Patient went to the bathroom recheck BP 114/68 sitting Standing BP 118/68. Heart rate 61 paced rhythm. Oxygen saturation 96% on room air. CBG 107. [name] NP paged and notified. Mickel Baas said that Tanya Hogan may proceed with her 6 minute walk test. Will continue to monitor the patient throughout  the program. Will continue to monitor the patient throughout  the program.Christianne Zacher Venetia Maxon, RN,BSN ?01/01/2022 12:13 PM  ?

## 2022-01-01 NOTE — Telephone Encounter (Signed)
Agree with you! ?

## 2022-01-05 ENCOUNTER — Encounter (HOSPITAL_COMMUNITY): Payer: Medicare Other

## 2022-01-05 ENCOUNTER — Encounter: Payer: Self-pay | Admitting: Family Medicine

## 2022-01-05 ENCOUNTER — Ambulatory Visit (INDEPENDENT_AMBULATORY_CARE_PROVIDER_SITE_OTHER): Payer: Medicare Other | Admitting: Family Medicine

## 2022-01-05 VITALS — BP 130/69 | HR 78 | Ht 61.0 in | Wt 193.0 lb

## 2022-01-05 DIAGNOSIS — J019 Acute sinusitis, unspecified: Secondary | ICD-10-CM | POA: Diagnosis not present

## 2022-01-05 DIAGNOSIS — E875 Hyperkalemia: Secondary | ICD-10-CM | POA: Diagnosis not present

## 2022-01-05 DIAGNOSIS — E118 Type 2 diabetes mellitus with unspecified complications: Secondary | ICD-10-CM | POA: Diagnosis not present

## 2022-01-05 DIAGNOSIS — I442 Atrioventricular block, complete: Secondary | ICD-10-CM

## 2022-01-05 DIAGNOSIS — I10 Essential (primary) hypertension: Secondary | ICD-10-CM | POA: Diagnosis not present

## 2022-01-05 LAB — POCT GLYCOSYLATED HEMOGLOBIN (HGB A1C): Hemoglobin A1C: 6.1 % — AB (ref 4.0–5.6)

## 2022-01-05 MED ORDER — DOXYCYCLINE HYCLATE 100 MG PO TABS: 100.0000 mg | ORAL_TABLET | Freq: Two times a day (BID) | ORAL | 0 refills | Status: DC

## 2022-01-05 NOTE — Assessment & Plan Note (Signed)
Well controlled. Continue current regimen. Follow up in  3-4 mo  ?

## 2022-01-05 NOTE — Patient Instructions (Addendum)
OK to hold your potassium   ?Go for labs in 10 days.   ?

## 2022-01-05 NOTE — Assessment & Plan Note (Signed)
She is actually feeling well and doing well with her new pacemaker ?

## 2022-01-05 NOTE — Progress Notes (Signed)
? ?  Acute Office Visit ? ?Subjective:  ? ?  ?Patient ID: Tanya Hogan, female    DOB: [birth year], 71 y.o.   MRN: [id number] ? ?Chief Complaint  ?Patient presents with  ? Sinusitis  ?  She reports that for the past 2 weeks she has been coughing up solid spongy yellow, tinges of red. Denies any f/s/c/n/v sinus pain pressure.  ? ?Pt stated that she stopped taking Protonix 3 weeks ago  ? ? ?HPI ?Patient is in today for She reports that for the past 2 weeks she has been coughing up solid spongy yellow, tinges of red. Denies any f/s/c/n/v sinus pain pressure. ? ?Pt stated that she stopped taking Protonix 3 weeks ago ? ? ?ROS ? ? ?   ?Objective:  ?  ?BP 130/69   Pulse 78   Ht 5' 1"  (1.549 m)   Wt 193 lb (87.5 kg)   SpO2 97%   BMI 36.47 kg/m?  ? ? ?Physical Exam ?Constitutional:   ?   Appearance: She is well-developed.  ?HENT:  ?   Head: Normocephalic and atraumatic.  ?   Right Ear: External ear normal.  ?   Left Ear: External ear normal.  ?   Nose: Nose normal.  ?Eyes:  ?   Conjunctiva/sclera: Conjunctivae normal.  ?   Pupils: Pupils are equal, round, and reactive to light.  ?Neck:  ?   Thyroid: No thyromegaly.  ?Cardiovascular:  ?   Rate and Rhythm: Normal rate and regular rhythm.  ?   Heart sounds: Normal heart sounds.  ?Pulmonary:  ?   Effort: Pulmonary effort is normal.  ?   Breath sounds: Normal breath sounds. No wheezing.  ?Musculoskeletal:  ?   Cervical back: Neck supple.  ?Lymphadenopathy:  ?   Cervical: No cervical adenopathy.  ?Skin: ?   General: Skin is warm and dry.  ?Neurological:  ?   Mental Status: She is alert and oriented to person, place, and time.  ? ? ?No results found for any visits on 01/05/22. ? ? ?   ?Assessment & Plan:  ? ?Problem List Items Addressed This Visit   ?None ?Visit Diagnoses   ? ? Acute non-recurrent sinusitis, unspecified location    -  Primary  ? Relevant Medications  ? doxycycline (VIBRA-TABS) 100 MG tablet  ? ?  ? ? ?Meds ordered this encounter  ?Medications  ? doxycycline  (VIBRA-TABS) 100 MG tablet  ?  Sig: Take 1 tablet (100 mg total) by mouth 2 (two) times daily.  ?  Dispense:  14 tablet  ?  Refill:  0  ? ? ?No follow-ups on file. ? ?Beatrice Lecher, MD ? ? ?

## 2022-01-05 NOTE — Assessment & Plan Note (Signed)
She reports she is doing well overall.  A1c is 6.1 today which was absolutely fantastic.  Follow-up in 3 months. ?

## 2022-01-05 NOTE — Progress Notes (Addendum)
? ?Established Patient Office Visit ? ?Subjective   ?Patient ID: Tanya Hogan, female    DOB: 03-01-1951  Age: 71 y.o. MRN: [id number] ? ?Chief Complaint  ?Patient presents with  ? Sinusitis  ?  She reports that for the past 2 weeks she has been coughing up solid spongy yellow, tinges of red. Denies any f/s/c/n/v sinus pain pressure.  ? ?Pt stated that she stopped taking Protonix 3 weeks ago  ? ? ?HPI ?She reports that for the past 2 weeks she has been coughing up solid spongy yellow, tinges of red. Denies any f/s/c/n/v . She has also had a lot of sinus congestion and pressure as well. ? ?She did want to let me know that she had stopped her Protonix about 3 weeks ago. ? ?She actually has been feeling better since having had her pacemaker placed.  She is worried about being near electrical devices that can affect the frequency.sdhe will start cardiac rehab soon.  ? ?She also reports that her last potassium level was normal and wants to know if she could try holding the potassium to see if she still needs it.  Last potassium was 4.9 on May 1. ? ?Diabetes - no hypoglycemic events. No wounds or sores that are not healing well. No increased thirst or urination. Checking glucose at home. Taking medications as prescribed without any side effects. ? ?She also let me know she had a little bit of drainage from her left nipple.  It only happened once and it was clear.  She is scheduled for her mammogram tomorrow ? ? ? ?ROS ? ?  ?Objective:  ?  ? ?BP 130/69   Pulse 78   Ht 5' 1"  (1.549 m)   Wt 193 lb (87.5 kg)   SpO2 97%   BMI 36.47 kg/m?  ? ? ?Physical Exam ?Vitals and nursing note reviewed.  ?Constitutional:   ?   Appearance: She is well-developed.  ?HENT:  ?   Head: Normocephalic and atraumatic.  ?   Right Ear: External ear normal.  ?   Left Ear: External ear normal.  ?   Nose: Nose normal.  ?Eyes:  ?   Conjunctiva/sclera: Conjunctivae normal.  ?   Pupils: Pupils are equal, round, and reactive to light.  ?Neck:  ?    Thyroid: No thyromegaly.  ?Cardiovascular:  ?   Rate and Rhythm: Normal rate and regular rhythm.  ?   Heart sounds: Normal heart sounds.  ?Pulmonary:  ?   Effort: Pulmonary effort is normal.  ?   Breath sounds: Normal breath sounds. No wheezing.  ?Musculoskeletal:  ?   Cervical back: Neck supple.  ?Lymphadenopathy:  ?   Cervical: No cervical adenopathy.  ?Skin: ?   General: Skin is warm and dry.  ?Neurological:  ?   Mental Status: She is alert and oriented to person, place, and time.  ?Psychiatric:     ?   Behavior: Behavior normal.  ? ? ? ?Results for orders placed or performed in visit on 01/05/22  ?POCT glycosylated hemoglobin (Hb A1C)  ?Result Value Ref Range  ? Hemoglobin A1C 6.1 (A) 4.0 - 5.6 %  ? HbA1c POC (<> result, manual entry)    ? HbA1c, POC (prediabetic range)    ? HbA1c, POC (controlled diabetic range)    ? ? ? ? ?The 10-year ASCVD risk score (Arnett DK, et al., 2019) is: 26.9% ? ?  ?Assessment & Plan:  ? ?Problem List Items Addressed This Visit   ? ?  ?  Cardiovascular and Mediastinum  ? Complete heart block ([place])  ?  She is actually feeling well and doing well with her new pacemaker ? ?  ?  ? Benign essential hypertension  ?  Well controlled. Continue current regimen. Follow up in  3-4 mo  ? ?  ?  ?  ? Endocrine  ? Controlled diabetes mellitus type 2 with complications ([place])  ?  She reports she is doing well overall.  A1c is 6.1 today which was absolutely fantastic.  Follow-up in 3 months. ? ?  ?  ? Relevant Orders  ? POCT glycosylated hemoglobin (Hb A1C) (Completed)  ? ?Other Visit Diagnoses   ? ? Acute non-recurrent sinusitis, unspecified location    -  Primary  ? Relevant Medications  ? doxycycline (VIBRA-TABS) 100 MG tablet  ? Other Relevant Orders  ? Fe+TIBC+Fer  ? BASIC METABOLIC PANEL WITH GFR  ? High potassium      ? Relevant Orders  ? Fe+TIBC+Fer  ? BASIC METABOLIC PANEL WITH GFR  ? ?  ? ? ?Acute sinusitis-we will treat with doxycycline.  If not better in 1 week then please let us  know. ? ?Did discuss that if she notices drainage again from the nipple to please let me know and we will refer her back to her breast surgeon. ? ?Hypokalemia -currently on potassium supplementation.  Okay to hold for the next 10 days and we can recheck it.  She really feels like the potassium is causing some constipation and would like to be able to get off of it if possible. ? ?Return in about 3 months (around 04/07/2022) for dm.  ? ? ?Beatrice Lecher, MD ? ?

## 2022-01-06 ENCOUNTER — Telehealth: Payer: Self-pay

## 2022-01-06 DIAGNOSIS — E039 Hypothyroidism, unspecified: Secondary | ICD-10-CM | POA: Diagnosis not present

## 2022-01-06 DIAGNOSIS — N643 Galactorrhea not associated with childbirth: Secondary | ICD-10-CM | POA: Diagnosis not present

## 2022-01-06 DIAGNOSIS — Z1231 Encounter for screening mammogram for malignant neoplasm of breast: Secondary | ICD-10-CM | POA: Diagnosis not present

## 2022-01-06 NOTE — Telephone Encounter (Signed)
Pt called requesting a refill of EpiPen, stating that the one that she has on hand is expired.  EpiPen is not on pt's current medication list.  Charyl Bigger, CMA ?

## 2022-01-07 ENCOUNTER — Encounter (HOSPITAL_COMMUNITY)
Admission: RE | Admit: 2022-01-07 | Discharge: 2022-01-07 | Disposition: A | Discharge status: Home / Self Care | Payer: Medicare Other | Source: Ambulatory Visit | Attending: Cardiology | Admitting: Cardiology

## 2022-01-07 DIAGNOSIS — G4733 Obstructive sleep apnea (adult) (pediatric): Secondary | ICD-10-CM | POA: Diagnosis not present

## 2022-01-07 DIAGNOSIS — I700 Atherosclerosis of aorta: Secondary | ICD-10-CM | POA: Diagnosis not present

## 2022-01-07 DIAGNOSIS — I442 Atrioventricular block, complete: Secondary | ICD-10-CM | POA: Diagnosis not present

## 2022-01-07 DIAGNOSIS — E739 Lactose intolerance, unspecified: Secondary | ICD-10-CM | POA: Diagnosis not present

## 2022-01-07 DIAGNOSIS — I5022 Chronic systolic (congestive) heart failure: Secondary | ICD-10-CM | POA: Diagnosis not present

## 2022-01-07 DIAGNOSIS — K7581 Nonalcoholic steatohepatitis (NASH): Secondary | ICD-10-CM | POA: Diagnosis not present

## 2022-01-07 DIAGNOSIS — Z713 Dietary counseling and surveillance: Secondary | ICD-10-CM | POA: Diagnosis not present

## 2022-01-07 DIAGNOSIS — E118 Type 2 diabetes mellitus with unspecified complications: Secondary | ICD-10-CM | POA: Diagnosis not present

## 2022-01-07 DIAGNOSIS — E039 Hypothyroidism, unspecified: Secondary | ICD-10-CM | POA: Diagnosis not present

## 2022-01-07 DIAGNOSIS — I110 Hypertensive heart disease with heart failure: Secondary | ICD-10-CM | POA: Diagnosis not present

## 2022-01-07 LAB — GLUCOSE, CAPILLARY: Glucose-Capillary: 92 mg/dL (ref 70–99)

## 2022-01-07 MED ORDER — EPIPEN 2-PAK 0.3 MG/0.3ML IJ SOAJ: INTRAMUSCULAR | 2 refills | Status: DC

## 2022-01-07 NOTE — Progress Notes (Signed)
Tanya Hogan 71 y.o. female ?Nutrition Note ?Tanya Hogan is contemplative of lifestyle changes to aid with cardiac  rehab. Patient has medical history of HTN, chronic systolic heart failure, aortic atherosclerosis, complete heart block, OSA, NASH, chronic constipation, IBS, hypothyroidism. She lives with her brother; she cares for him as he has Parkinson's disease. She does the grocery shopping and cooking, though she reports difficulty cooking and they eat out frequently. She reports history of food sensitives identified by blood testing as opposed to skin allergy tests; she reports sensitivities to grass which she stated includes corn and wheat, soy, possibly peanuts, wheat, beef, etc. She does report lactose intolerance. She reports doing some intermittent fasting 12-18 hours; she reports fasting helps her have regular BMs. She reports regular BM when taking Linzess twice daily; she uses a suppository daily. She reports inability to tolerate fiber supplements. She reports fluid restriction of 55oz per day.  ? ?Patient was unable to exercise today as she was late and blood sugar was <110.  ? ? ?24 hour Recall:  ?Breakfast: 2 [employer] dinner rolls, [place] bacon, tomato, 1 pad of butter ?Lunch: Ham sandwich OR scrambled egg sandwich OR blueberry,toast, cheddar cheese ?Dinner: [employer], salad, sweet potatoes ?Drinks: water, 1 cup of coffee  ? ?Labs: A1c 6.3 ? ?Nutrition Diagnosis ?Obesity related to excessive energy intake as evidenced by a 36.47 ? ?Nutrition Intervention ?Pt?s individual nutrition plan reviewed with pt. ?Benefits of adopting Heart Healthy diet discussed.   ?Continue client-centered nutrition education by RD, as part of interdisciplinary care. ? ?Monitor/Evaluation: ?Patient reports motivation to make lifestyle changes for adherence to heart healthy diet recommendation, blood sugar control, and weight management.  Discouraged 18hr intermittent fasting and encouraged regular  consistent eating patterns to support hunger/ satiety management, blood sugar control, etc. We discussed the plate method as a guide for meal planning and increasing fiber intake. Discussed lactose intolerance and appropriate lower lactose dairy products. Patient amicable to RD suggestions and verbalizes understanding. Will follow-up as needed.  ? ?23 minutes spent in review of topics related to a heart healthy diet including sodium intake, blood sugar control, weight management, and fiber intake. ? ?Goal(s): ?Patient to use the plate method as a guide for meal planning ?Patient to increase fiber intake through vegetables, whole grains, fruit. ?Patient to prioritize protein + complex carbohydrates with 1.5 hours of exercise to support blood sugar control.  ?Pt to identify and limit food sources of saturated fat, trans fat, refined carbohydrates and sodium ?Pt to identify food quantities necessary to achieve weight loss of 6-24 lb at graduation from cardiac rehab.  ?Pt able to name foods that affect blood glucose. Continue to limit simple sugars, refined carbohydrates, sugary beverages, etc.  ?Pt to describe the benefit of including lean protein/plant proteins, fruits, vegetables, whole grains, nuts/seeds, and low-fat dairy products in a heart healthy meal plan. ?Pt to practice mindful and intuitive eating exercises ? ?Plan:  ?Will provide client-centered nutrition education as part of interdisciplinary care ?Monitor and evaluate progress toward nutrition goal with team. ? ? ?Tanya Teed [place], MS, RDN, LDN  ?

## 2022-01-07 NOTE — Progress Notes (Signed)
Incomplete Session Note ? ?Patient Details  ?Name: Tanya Hogan ?MRN: [id number] ?Date of Birth: 09-26-1950 ?Referring Provider:   ?Flowsheet Row CARDIAC REHAB PHASE II ORIENTATION from 01/01/2022 in [place]  ?Referring Provider Gwyndolyn Kaufman, MD  ? ?  ? ? ?[name] did not complete her rehab session.  CBG 92. [name] arrived late and reported eating breakfast at 0800. No exercise per protocol. Patient was asymptomatic and was given a ginger ale. [name] was counseled by the dietitian and plans to return to exercise on Friday.Harrell Gave RN BSN  ?

## 2022-01-07 NOTE — Telephone Encounter (Signed)
Epi pen recorded. ? ?LVM notifying pt  ?

## 2022-01-08 ENCOUNTER — Telehealth: Payer: Self-pay

## 2022-01-08 ENCOUNTER — Ambulatory Visit (HOSPITAL_COMMUNITY): Payer: Medicare Other | Attending: Cardiovascular Disease

## 2022-01-08 DIAGNOSIS — I5022 Chronic systolic (congestive) heart failure: Secondary | ICD-10-CM

## 2022-01-08 DIAGNOSIS — I509 Heart failure, unspecified: Secondary | ICD-10-CM | POA: Insufficient documentation

## 2022-01-08 LAB — ECHOCARDIOGRAM LIMITED
Area-P 1/2: 4.21 cm2
S' Lateral: 5.8 cm

## 2022-01-08 MED ORDER — EPINEPHRINE 0.3 MG/0.3ML IJ SOAJ: INTRAMUSCULAR | 2 refills | Status: DC

## 2022-01-08 NOTE — Telephone Encounter (Signed)
Pt called stating that her insurance will not cover EpiPen.  Pt is requesting a new RX be sent for the generic version.  RX for generic sent to pharmacy.  Charyl Bigger, CMA

## 2022-01-09 ENCOUNTER — Telehealth: Payer: Self-pay

## 2022-01-09 ENCOUNTER — Encounter (HOSPITAL_COMMUNITY)
Admission: RE | Admit: 2022-01-09 | Discharge: 2022-01-09 | Disposition: A | Discharge status: Home / Self Care | Payer: Medicare Other | Source: Ambulatory Visit | Attending: Cardiology | Admitting: Cardiology

## 2022-01-09 DIAGNOSIS — E739 Lactose intolerance, unspecified: Secondary | ICD-10-CM | POA: Diagnosis not present

## 2022-01-09 DIAGNOSIS — I5022 Chronic systolic (congestive) heart failure: Secondary | ICD-10-CM

## 2022-01-09 DIAGNOSIS — Z713 Dietary counseling and surveillance: Secondary | ICD-10-CM | POA: Diagnosis not present

## 2022-01-09 DIAGNOSIS — I442 Atrioventricular block, complete: Secondary | ICD-10-CM | POA: Diagnosis not present

## 2022-01-09 DIAGNOSIS — I110 Hypertensive heart disease with heart failure: Secondary | ICD-10-CM | POA: Diagnosis not present

## 2022-01-09 DIAGNOSIS — E039 Hypothyroidism, unspecified: Secondary | ICD-10-CM | POA: Diagnosis not present

## 2022-01-09 DIAGNOSIS — E118 Type 2 diabetes mellitus with unspecified complications: Secondary | ICD-10-CM | POA: Diagnosis not present

## 2022-01-09 DIAGNOSIS — G4733 Obstructive sleep apnea (adult) (pediatric): Secondary | ICD-10-CM | POA: Diagnosis not present

## 2022-01-09 DIAGNOSIS — K7581 Nonalcoholic steatohepatitis (NASH): Secondary | ICD-10-CM | POA: Diagnosis not present

## 2022-01-09 DIAGNOSIS — I700 Atherosclerosis of aorta: Secondary | ICD-10-CM | POA: Diagnosis not present

## 2022-01-09 LAB — GLUCOSE, CAPILLARY
Glucose-Capillary: 169 mg/dL — ABNORMAL HIGH (ref 70–99)
Glucose-Capillary: 105 mg/dL — ABNORMAL HIGH (ref 70–99)

## 2022-01-09 NOTE — Telephone Encounter (Addendum)
Initiated Prior authorization MGQ:QPYPPJ 2-PAK) 0.3 mg/0.3 mL IJ SOAJ injection   Via: Covermymeds Case/Key:[id number]  Status: partially denied as of 01/09/22 Reason:The requested drug requires non-formulary review. Additionally, the quantity you requested also requires quantity limit review. Brand EPIPEN 2-PAK INJ 0.3MG, use as directed, is approved for a non-formulary exception through 08/23/2022 under your Medicare Part D benefit. Brand EPIPEN 2-PAK INJ 0.3MG (use as directed: 2 pens per day) is denied for not meeting the quantity limit requirement. Quantities at or below your plan's limit 4 pens per 30 days will continue to process at the pharmacy for you. Coverage for the requested quantity requires the following: One of the following: (1) The higher dose or quantity is supported in the dosage and administration section of the manufacturer's prescribing information. Notified Pt via: Mychart

## 2022-01-09 NOTE — Progress Notes (Signed)
Daily Session Note  Patient Details  Name: Tanya Hogan MRN: [id number] Date of Birth: June 05, 1951 Referring Provider:   Flowsheet Row CARDIAC REHAB PHASE II ORIENTATION from 01/01/2022 in [place]  Referring Provider Gwyndolyn Kaufman, MD       Encounter Date: 01/09/2022  Check In:  Session Check In - 01/09/22 1032       Check-In   Supervising physician immediately available to respond to emergencies [facility] immediately available    Physician(s) Dr. Tawanna Solo    Location [facility]    Staff Present Barnet Pall, RN, Milus Glazier, MS, ACSM-CEP, CCRP, Exercise Physiologist;Olinty Celesta Aver, MS, ACSM CEP, Exercise Physiologist;Carlette Wilber [place], RN, BSN    Virtual Visit No    Medication changes reported     No    Fall or balance concerns reported    No    Tobacco Cessation No Change    Warm-up and Cool-down Performed as group-led instruction    Resistance Training Performed Yes    VAD Patient? No    PAD/SET Patient? No      Pain Assessment   Currently in Pain? No/denies    Pain Score 0-No pain    Multiple Pain Sites No             Capillary Blood Glucose: Results for orders placed or performed during the hospital encounter of 01/07/22 (from the past 24 hour(s))  Glucose, capillary     Status: Abnormal   Collection Time: 01/09/22 10:18 AM  Result Value Ref Range   Glucose-Capillary 169 (H) 70 - 99 mg/dL  Glucose, capillary     Status: Abnormal   Collection Time: 01/09/22 11:09 AM  Result Value Ref Range   Glucose-Capillary 105 (H) 70 - 99 mg/dL     Exercise Prescription Changes - 01/09/22 1027       Response to Exercise   Blood Pressure (Admit) 108/70    Blood Pressure (Exercise) 118/70    Blood Pressure (Exit) 118/72    Heart Rate (Admit) 63 bpm    Heart Rate (Exercise) 87 bpm    Heart Rate (Exit) 68 bpm    Rating of Perceived Exertion (Exercise) 13    Symptoms None    Comments Off to a  good start with exercise.    Duration Progress to 30 minutes of  aerobic without signs/symptoms of physical distress    Intensity THRR unchanged      Progression   Progression Continue to progress workloads to maintain intensity without signs/symptoms of physical distress.    Average METs 1.5      Resistance Training   Training Prescription Yes    Weight 1 lb    Reps 10-15    Time 10 Minutes      Interval Training   Interval Training No      NuStep   Level 1    SPM 56    Minutes 25    METs 1.5             Social History   Tobacco Use  Smoking Status Former   Packs/day: 0.00   Years: 0.00   Pack years: 0.00   Types: Cigarettes   Quit date: 08/24/1966   Years since quitting: 55.4  Smokeless Tobacco Never    Goals Met:  Exercise tolerated well No report of concerns or symptoms today Strength training completed today  Goals Unmet:  Not Applicable  Comments: Tanya Hogan started cardiac rehab today.  Pt tolerated light  exercise without difficulty. VSS, telemetry-paced, asymptomatic.  Medication list reconciled. Pt denies barriers to medicaiton compliance.  PSYCHOSOCIAL ASSESSMENT:  PHQ-0. Pt exhibits positive coping skills, hopeful outlook with supportive family. No psychosocial needs identified at this time, no psychosocial interventions necessary.    Pt enjoys making jewelry and going to church .   Pt oriented to exercise equipment and routine.    Understanding verbalized.Harrell Gave RN BSN    Dr. Fransico Him is Medical Director for Cardiac Rehab at [facility].

## 2022-01-09 NOTE — Telephone Encounter (Signed)
Patient called back, she states this needs PA. Please work on this when you can.

## 2022-01-12 ENCOUNTER — Encounter (HOSPITAL_COMMUNITY)
Admission: RE | Admit: 2022-01-12 | Discharge: 2022-01-12 | Disposition: A | Discharge status: Home / Self Care | Payer: Medicare Other | Source: Ambulatory Visit | Attending: Cardiology | Admitting: Cardiology

## 2022-01-12 ENCOUNTER — Telehealth: Payer: Self-pay

## 2022-01-12 ENCOUNTER — Ambulatory Visit: Payer: Medicare Other | Admitting: Family Medicine

## 2022-01-12 DIAGNOSIS — I5022 Chronic systolic (congestive) heart failure: Secondary | ICD-10-CM

## 2022-01-12 DIAGNOSIS — E118 Type 2 diabetes mellitus with unspecified complications: Secondary | ICD-10-CM | POA: Diagnosis not present

## 2022-01-12 DIAGNOSIS — I442 Atrioventricular block, complete: Secondary | ICD-10-CM | POA: Diagnosis not present

## 2022-01-12 DIAGNOSIS — K7581 Nonalcoholic steatohepatitis (NASH): Secondary | ICD-10-CM | POA: Diagnosis not present

## 2022-01-12 DIAGNOSIS — G4733 Obstructive sleep apnea (adult) (pediatric): Secondary | ICD-10-CM | POA: Diagnosis not present

## 2022-01-12 DIAGNOSIS — Z713 Dietary counseling and surveillance: Secondary | ICD-10-CM | POA: Diagnosis not present

## 2022-01-12 DIAGNOSIS — E739 Lactose intolerance, unspecified: Secondary | ICD-10-CM | POA: Diagnosis not present

## 2022-01-12 DIAGNOSIS — I700 Atherosclerosis of aorta: Secondary | ICD-10-CM | POA: Diagnosis not present

## 2022-01-12 DIAGNOSIS — E039 Hypothyroidism, unspecified: Secondary | ICD-10-CM | POA: Diagnosis not present

## 2022-01-12 DIAGNOSIS — I110 Hypertensive heart disease with heart failure: Secondary | ICD-10-CM | POA: Diagnosis not present

## 2022-01-12 NOTE — Telephone Encounter (Signed)
Tanya Hogan states when she goes to outpatient rehab her blood glucose is around 100 mg/dl. The rehab center is concerned her blood glucose will drop to low. She is requesting Dr Madilyn Fireman cut her [name] in half while she does rehab. Please advise.

## 2022-01-12 NOTE — Telephone Encounter (Signed)
Ok to stop cut FJardiance and a half on days that she goes to physical therapy. Also make sure eats before PT

## 2022-01-13 ENCOUNTER — Telehealth: Payer: Self-pay

## 2022-01-13 NOTE — Telephone Encounter (Signed)
Not necessarily opposed to sending an EpiPen but I still do not see any evidence of a visit with an allergist.  Where did she go before and who did she see.  I tried to scroll through my chart and look under the media tab and did not see anything.

## 2022-01-13 NOTE — Telephone Encounter (Signed)
Patient advised.

## 2022-01-13 NOTE — Telephone Encounter (Signed)
Okay, can we please clarify for what.  I am not seeing a allergy on her list such as venom etc.  Not sure if we have written for it before not.

## 2022-01-13 NOTE — Telephone Encounter (Signed)
Tanya Hogan states the allergy doctor sent in this prescription to have on hand. She states she had a terrible allergic reaction once when she went outside around the goats and was bitten by a bunch of mosquitoes.

## 2022-01-13 NOTE — Telephone Encounter (Signed)
[name] called and left a message stating she needs a generic Epi pen.

## 2022-01-14 ENCOUNTER — Encounter (HOSPITAL_COMMUNITY)
Admission: RE | Admit: 2022-01-14 | Discharge: 2022-01-14 | Disposition: A | Discharge status: Home / Self Care | Payer: Medicare Other | Source: Ambulatory Visit | Attending: Cardiology | Admitting: Cardiology

## 2022-01-14 DIAGNOSIS — I442 Atrioventricular block, complete: Secondary | ICD-10-CM | POA: Diagnosis not present

## 2022-01-14 DIAGNOSIS — E118 Type 2 diabetes mellitus with unspecified complications: Secondary | ICD-10-CM | POA: Diagnosis not present

## 2022-01-14 DIAGNOSIS — Z713 Dietary counseling and surveillance: Secondary | ICD-10-CM | POA: Diagnosis not present

## 2022-01-14 DIAGNOSIS — E039 Hypothyroidism, unspecified: Secondary | ICD-10-CM | POA: Diagnosis not present

## 2022-01-14 DIAGNOSIS — I5022 Chronic systolic (congestive) heart failure: Secondary | ICD-10-CM

## 2022-01-14 DIAGNOSIS — K7581 Nonalcoholic steatohepatitis (NASH): Secondary | ICD-10-CM | POA: Diagnosis not present

## 2022-01-14 DIAGNOSIS — E739 Lactose intolerance, unspecified: Secondary | ICD-10-CM | POA: Diagnosis not present

## 2022-01-14 DIAGNOSIS — I110 Hypertensive heart disease with heart failure: Secondary | ICD-10-CM | POA: Diagnosis not present

## 2022-01-14 DIAGNOSIS — G4733 Obstructive sleep apnea (adult) (pediatric): Secondary | ICD-10-CM | POA: Diagnosis not present

## 2022-01-14 DIAGNOSIS — I700 Atherosclerosis of aorta: Secondary | ICD-10-CM | POA: Diagnosis not present

## 2022-01-14 NOTE — Telephone Encounter (Signed)
Patient advised of message. Patient stated she saw [facility]. Dr. Pablo Lawrence. Patient stated it has been several years since she has saw her but will make an appointment with her.

## 2022-01-15 MED ORDER — EPINEPHRINE 0.3 MG/0.3ML IJ SOAJ: 0.3000 mg | INTRAMUSCULAR | 1 refills | Status: DC | PRN

## 2022-01-15 NOTE — Telephone Encounter (Signed)
Okay, I did not realize there was initial prescription and then we were working on a PA and then maybe it was denied?  Still not sure?  So I will send over a generic 1.

## 2022-01-16 ENCOUNTER — Encounter (HOSPITAL_COMMUNITY)
Admission: RE | Admit: 2022-01-16 | Discharge: 2022-01-16 | Disposition: A | Discharge status: Home / Self Care | Payer: Medicare Other | Source: Ambulatory Visit | Attending: Cardiology | Admitting: Cardiology

## 2022-01-16 DIAGNOSIS — G4733 Obstructive sleep apnea (adult) (pediatric): Secondary | ICD-10-CM | POA: Diagnosis not present

## 2022-01-16 DIAGNOSIS — I110 Hypertensive heart disease with heart failure: Secondary | ICD-10-CM | POA: Diagnosis not present

## 2022-01-16 DIAGNOSIS — Z713 Dietary counseling and surveillance: Secondary | ICD-10-CM | POA: Diagnosis not present

## 2022-01-16 DIAGNOSIS — E039 Hypothyroidism, unspecified: Secondary | ICD-10-CM | POA: Diagnosis not present

## 2022-01-16 DIAGNOSIS — I700 Atherosclerosis of aorta: Secondary | ICD-10-CM | POA: Diagnosis not present

## 2022-01-16 DIAGNOSIS — E739 Lactose intolerance, unspecified: Secondary | ICD-10-CM | POA: Diagnosis not present

## 2022-01-16 DIAGNOSIS — K7581 Nonalcoholic steatohepatitis (NASH): Secondary | ICD-10-CM | POA: Diagnosis not present

## 2022-01-16 DIAGNOSIS — E118 Type 2 diabetes mellitus with unspecified complications: Secondary | ICD-10-CM | POA: Diagnosis not present

## 2022-01-16 DIAGNOSIS — I442 Atrioventricular block, complete: Secondary | ICD-10-CM | POA: Diagnosis not present

## 2022-01-16 DIAGNOSIS — I5022 Chronic systolic (congestive) heart failure: Secondary | ICD-10-CM

## 2022-01-18 DIAGNOSIS — E118 Type 2 diabetes mellitus with unspecified complications: Secondary | ICD-10-CM | POA: Diagnosis not present

## 2022-01-21 ENCOUNTER — Telehealth: Payer: Self-pay | Admitting: *Deleted

## 2022-01-21 ENCOUNTER — Encounter (HOSPITAL_COMMUNITY)
Admission: RE | Admit: 2022-01-21 | Discharge: 2022-01-21 | Disposition: A | Discharge status: Home / Self Care | Payer: Medicare Other | Source: Ambulatory Visit | Attending: Cardiology | Admitting: Cardiology

## 2022-01-21 DIAGNOSIS — J3081 Allergic rhinitis due to animal (cat) (dog) hair and dander: Secondary | ICD-10-CM | POA: Diagnosis not present

## 2022-01-21 DIAGNOSIS — I5022 Chronic systolic (congestive) heart failure: Secondary | ICD-10-CM | POA: Diagnosis not present

## 2022-01-21 DIAGNOSIS — E039 Hypothyroidism, unspecified: Secondary | ICD-10-CM | POA: Diagnosis not present

## 2022-01-21 DIAGNOSIS — E739 Lactose intolerance, unspecified: Secondary | ICD-10-CM | POA: Diagnosis not present

## 2022-01-21 DIAGNOSIS — J301 Allergic rhinitis due to pollen: Secondary | ICD-10-CM | POA: Diagnosis not present

## 2022-01-21 DIAGNOSIS — I442 Atrioventricular block, complete: Secondary | ICD-10-CM | POA: Diagnosis not present

## 2022-01-21 DIAGNOSIS — J3089 Other allergic rhinitis: Secondary | ICD-10-CM | POA: Diagnosis not present

## 2022-01-21 DIAGNOSIS — K7581 Nonalcoholic steatohepatitis (NASH): Secondary | ICD-10-CM | POA: Diagnosis not present

## 2022-01-21 DIAGNOSIS — I700 Atherosclerosis of aorta: Secondary | ICD-10-CM | POA: Diagnosis not present

## 2022-01-21 DIAGNOSIS — E118 Type 2 diabetes mellitus with unspecified complications: Secondary | ICD-10-CM | POA: Diagnosis not present

## 2022-01-21 DIAGNOSIS — J4540 Moderate persistent asthma, uncomplicated: Secondary | ICD-10-CM | POA: Diagnosis not present

## 2022-01-21 DIAGNOSIS — I110 Hypertensive heart disease with heart failure: Secondary | ICD-10-CM | POA: Diagnosis not present

## 2022-01-21 DIAGNOSIS — G4733 Obstructive sleep apnea (adult) (pediatric): Secondary | ICD-10-CM | POA: Diagnosis not present

## 2022-01-21 DIAGNOSIS — Z713 Dietary counseling and surveillance: Secondary | ICD-10-CM | POA: Diagnosis not present

## 2022-01-21 NOTE — Telephone Encounter (Signed)
Received fax from [employer] Patient assistance

## 2022-01-22 ENCOUNTER — Ambulatory Visit (INDEPENDENT_AMBULATORY_CARE_PROVIDER_SITE_OTHER): Payer: Medicare Other | Admitting: Pharmacist

## 2022-01-22 DIAGNOSIS — E118 Type 2 diabetes mellitus with unspecified complications: Secondary | ICD-10-CM

## 2022-01-22 DIAGNOSIS — I10 Essential (primary) hypertension: Secondary | ICD-10-CM

## 2022-01-22 DIAGNOSIS — I5022 Chronic systolic (congestive) heart failure: Secondary | ICD-10-CM

## 2022-01-22 DIAGNOSIS — E1169 Type 2 diabetes mellitus with other specified complication: Secondary | ICD-10-CM

## 2022-01-22 NOTE — Progress Notes (Signed)
Chronic Care Management Pharmacy Note  01/22/2022 Name:  Tanya Hogan MRN:  [id number] DOB:  1951/04/14  Summary: addressed HTN, HLD, and primarily DM. She is back to taking a full tablet of jardiance 27m daily (after previous arrangements for half tablet on therapy days due to staff concern that she would go too low on blood glucose. She states she prefers to take full medicine dose for glycemic control, and drinks boost prior to therapy sessions to make sure she is hydrated & has energy for their session.  BG values per CGM: 150, 128, 130, mostly under 120s.    Recommendations/Changes made from today's visit:  Continue current regimen.   Cost assistance for ozempic for 2023 approved and doing well for her.  Plan: f/u with pharmacist in 3 months  Subjective: Tanya Hogan an 71y.o. year old female who is a primary patient of [name], CRene Kocher MD.  The CCM team was consulted for assistance with disease management and care coordination needs.    Engaged with patient by telephone for follow up visit in response to provider referral for pharmacy case management and/or care coordination services.   Consent to Services:  The patient was given information about Chronic Care Management services, agreed to services, and gave verbal consent prior to initiation of services.  Please see initial visit note for detailed documentation.   Patient Care Team: MHali Marry MD as PCP - General (Family Medicine) PFreada Bergeron MD as PCP - Cardiology (Cardiology) KMarcy Panning MD (Hematology and Oncology) CLelon Perla MD as Attending Physician (Cardiology) TEvans Lance MD (Cardiology) Dr. LCaprice Kluver(Otolaryngology) FAmada Kingfisher MD as Consulting Physician (Hematology and Oncology) AGerre Pebbles MD as Referring Physician (Gastroenterology) FHaynes Dage OD as Referring Physician (Optometry) KDarius Bump [facility] Pharmacist  (Pharmacist)   Objective:  Lab Results  Component Value Date   CREATININE 0.92 12/22/2021   CREATININE 1.08 (H) 10/24/2021   CREATININE 1.12 (H) 09/23/2021    Lab Results  Component Value Date   HGBA1C 6.1 (A) 01/05/2022   Last diabetic Eye exam:  Lab Results  Component Value Date/Time   HMDIABEYEEXA No Retinopathy 06/11/2021 12:00 AM    Last diabetic Foot exam: No results found for: HMDIABFOOTEX      Component Value Date/Time   CHOL 168 04/16/2021 0000   TRIG 120 04/16/2021 0000   HDL 73 04/16/2021 0000   CHOLHDL 2.3 04/16/2021 0000   VLDL 26 02/18/2017 0939   LDLCALC 74 04/16/2021 0000       Latest Ref Rng & Units 04/16/2021   12:00 AM 03/26/2020    7:55 AM 10/20/2019    4:57 PM  Hepatic Function  Total Protein 6.1 - 8.1 g/dL 7.1   6.6   6.4    AST 10 - 35 U/L 22   20   19     ALT 6 - 29 U/L 18   16   15     Total Bilirubin 0.2 - 1.2 mg/dL 0.4   0.4   0.3      Lab Results  Component Value Date/Time   TSH 2.54 08/14/2021 12:00 AM   TSH 5.96 (H) 07/03/2021 08:29 AM       Latest Ref Rng & Units 10/24/2021   10:45 AM 04/16/2021   12:00 AM 03/14/2019   11:06 AM  CBC  WBC 3.4 - 10.8 x10E3/uL 6.1   5.4   6.5    Hemoglobin 11.1 - 15.9  g/dL 15.1   12.2   14.1    Hematocrit 34.0 - 46.6 % 46.6   38.0   42.7    Platelets 150 - 450 x10E3/uL 243   299   287      Lab Results  Component Value Date/Time   VD25OH 65 05/04/2014 02:27 PM   VD25OH 61 12/29/2010 08:05 AM    Clinical ASCVD: Yes  The 10-year ASCVD risk score (Arnett DK, et al., 2019) is: 22.1%   Values used to calculate the score:     Age: 71 years     Sex: Female     Is Non-Hispanic African American: No     Diabetic: Yes     Tobacco smoker: No     Systolic Blood Pressure: 166 mmHg     Is BP treated: Yes     HDL Cholesterol: 73 mg/dL     Total Cholesterol: 168 mg/dL      Social History   Tobacco Use  Smoking Status Former   Packs/day: 0.00   Years: 0.00   Pack years: 0.00   Types: Cigarettes    Quit date: 08/24/1966   Years since quitting: 55.4  Smokeless Tobacco Never   BP Readings from Last 3 Encounters:  01/05/22 130/69  01/01/22 104/72  12/22/21 112/76   Pulse Readings from Last 3 Encounters:  01/05/22 78  01/01/22 62  12/22/21 (!) 101   Wt Readings from Last 3 Encounters:  01/05/22 193 lb (87.5 kg)  01/01/22 194 lb 14.2 oz (88.4 kg)  12/22/21 191 lb 3.2 oz (86.7 kg)    Assessment: Review of patient past medical history, allergies, medications, health status, including review of consultants reports, laboratory and other test data, was performed as part of comprehensive evaluation and provision of chronic care management services.   SDOH:  (Social Determinants of Health) assessments and interventions performed:    CCM Care Plan  Allergies  Allergen Reactions   Injectafer [Ferric Carboxymaltose] Shortness Of Breath    Mild SOB following injectafer infusion Mild SOB following injectafer infusion    Penicillins Anaphylaxis and Rash    Has patient had a PCN reaction causing immediate rash, facial/tongue/throat swelling, SOB or lightheadedness with hypotension: Yes Has patient had a PCN reaction causing severe rash involving mucus membranes or skin necrosis: No Has patient had a PCN reaction that required hospitalization: No Has patient had a PCN reaction occurring within the last 10 years: No If all of the above answers are "NO", then may proceed with Cephalosporin use. "Stuttered with medication"     Accupril [Quinapril Hcl] Other (See Comments)    Hyperkalemia=high potassium level   Aleve [Naproxen] Swelling   Celebrex [Celecoxib]     ulcers   Cozaar [Losartan Potassium] Itching   Flonase [Fluticasone] Other (See Comments)    Nasal ulcer.     Levomenol Other (See Comments)    unknown unknown    Lipitor [Atorvastatin] Other (See Comments)    muscle aches/pains   Nasonex [Mometasone Furoate] Other (See Comments)    nosebleed   Xyzal [Levocetirizine]  Other (See Comments)    Doesn't recall reaction type   Chamomile Other (See Comments)    unknown   Vesicare [Solifenacin] Other (See Comments)    "stopped me from urinating"   Aspirin Other (See Comments)    Pt states "burns holes in stomach", ulcers    Baking Soda-Fluoride [Sodium Fluoride] Itching and Rash   Clindamycin/Lincomycin Rash   Codeine Nausea And Vomiting   Furosemide  Other (See Comments)    Nose bleed   Lactose Intolerance (Gi) Other (See Comments)    Increased calcium, sneezing, nasal issues   Lanolin-Petrolatum Rash   Metformin And Related Diarrhea   Miralax [Polyethylene Glycol] Rash   Quinine Rash   Reclast [Zoledronic Acid] Rash and Other (See Comments)    shaking   Rifampin Other (See Comments)    DOES NOT REMEMBER THIS MEDICATION OR ALLERGY   Sulfa Antibiotics Rash   Sulfonamide Derivatives Rash   Tramadol Rash    Anxiety   Wellbutrin [Bupropion] Other (See Comments)    Muscle aches, pain, "made my skin crawl"   Wool Alcohol [Lanolin] Rash    Medications Reviewed Today     Reviewed by Teddy Spike, CMA (Certified Medical Assistant) on 01/05/22 at 1044  Med List Status: <None>   Medication Order Taking? Sig Documenting Provider Last Dose Status Informant  acetaminophen (TYLENOL) 500 MG tablet [id number] Yes Take 500-1,000 mg by mouth every 6 (six) hours as needed (for pain.). [provider] Taking Active Self  albuterol (PROVENTIL) (2.5 MG/3ML) 0.083% nebulizer solution [id number] Yes Take 2.5 mg by nebulization every 6 (six) hours as needed for wheezing or shortness of breath. [provider] Taking Active Self  albuterol (VENTOLIN HFA) 108 (90 Base) MCG/ACT inhaler [id number] Yes Inhale 1-2 puffs into the lungs every 6 (six) hours as needed for wheezing or shortness of breath. Hali Marry, MD Taking Active Self  alendronate (FOSAMAX) 70 MG tablet [id number] Yes TAKE 1 TABLET BY MOUTH  EVERY 7 DAYS WITH A FULL  GLASS OF WATER ON  AN EMPTY  STOMACH  Patient taking differently: Take 70 mg by mouth every Saturday.   Hali Marry, MD Taking Active Self  Camillo Flaming MEDICATION [id number] Yes Medication Name: Tubing and supplies for nebulizer. Fax - [phone] Hali Marry, MD Taking Active Self  amiodarone (PACERONE) 200 MG tablet [id number] Yes TAKE ONE-HALF TABLET BY  MOUTH DAILY Evans Lance, MD Taking Active Self  B Complex-C (B-COMPLEX WITH VITAMIN C) tablet [id number] Yes Take 1 tablet by mouth in the morning. [provider] Taking Active Self  bumetanide (BUMEX) 1 MG tablet [id number] Yes TAKE 1 TABLET BY MOUTH TWICE  DAILY Hali Marry, MD Taking Active Self  carvedilol (COREG) 3.125 MG tablet [id number] Yes Take 1 tablet (3.125 mg total) by mouth 2 (two) times daily. Elgie Collard, PA-C Taking Active Self  Continuous Blood Gluc Receiver (FREESTYLE LIBRE 2 READER) DEVI [id number] Yes by Does not apply route. [provider] Taking Active Self  Continuous Blood Gluc Sensor (FREESTYLE LIBRE 2 SENSOR) MISC [id number] Yes by Does not apply route. [provider] Taking Active Self  fexofenadine (ALLEGRA) 180 MG tablet [id number] Yes Take 180 mg by mouth in the morning. [provider] Taking Active Self  fluticasone-salmeterol (ADVAIR HFA) 067-70 MCG/ACT inhaler [id number] Yes Inhale 2 puffs into the lungs 2 (two) times daily. [provider] Taking Active Self  JARDIANCE 10 MG TABS tablet [id number] Yes TAKE 1 TABLET(10 MG) BY MOUTH DAILY BEFORE BREAKFAST Hali Marry, MD Taking Active Self  levothyroxine (SYNTHROID) 88 MCG tablet [id number] Yes TAKE 1 TABLET(88 MCG) BY MOUTH DAILY Hali Marry, MD Taking Active Self  LINZESS 72 MCG capsule [id number] Yes Take 72-144 mcg by mouth 2 (two) times daily as needed (constipation.). [provider] Taking Active Self           Med Note Louretta Shorten, APRIL  Mon Oct 13, 2021  11:02 AM)    Multiple Vitamins-Minerals (OCUVITE EYE + MULTI) TABS [id number] Yes Take 1 tablet by mouth in the morning. [provider] Taking Active Self  PAPAYA ENZYME PO [id number] Yes Take 1 capsule by mouth in the morning. [provider] Taking Active Self  potassium chloride (KLOR-CON) 10 MEQ tablet [id number] Yes Take 1 tablet (10 mEq total) by mouth every other day.  Patient taking differently: Take 10 mEq by mouth daily.   Elgie Collard, [place] Taking Active Self  pravastatin (PRAVACHOL) 40 MG tablet [id number] No TAKE 1 TABLET BY MOUTH AT  BEDTIME  Patient not taking: Reported on 01/05/2022   Hali Marry, MD Not Taking Active Self  Probiotic Product ([employer] PROBIOTIC) CAPS [id number] Yes Take 2 capsules by mouth in the morning. [provider] Taking Active Self  Semaglutide,0.25 or 0.5MG/DOS, (OZEMPIC, 0.25 OR 0.5 MG/DOSE,) 2 MG/1.5ML SOPN [id number] Yes Inject 0.25 mg into the skin every Saturday. [provider] Taking Active Self           Med Note Louretta Shorten, APRIL   Mon Oct 13, 2021 11:02 AM)    Tiotropium Bromide Monohydrate (SPIRIVA RESPIMAT) 1.25 MCG/ACT AERS [id number] Yes Inhale 2 puffs into the lungs daily. [provider] Taking Active Self  valACYclovir (VALTREX) 1000 MG tablet [id number] Yes Take 1 tablet (1,000 mg total) by mouth 2 (two) times daily.  Patient taking differently: Take 1,000 mg by mouth 2 (two) times daily as needed (fever blisters/cold sores.).   Hali Marry, MD Taking Active Self            Patient Active Problem List   Diagnosis Date Noted   Complete heart block ([place]) 10/24/2021   Osteoporosis 02/28/2021   Primary osteoarthritis of left knee 01/14/2021   Irritable bowel syndrome with constipation 09/16/2020   Aortic atherosclerosis ([place]) 06/11/2020   Benign lipomatous neoplasm of skin and subcutaneous tissue of left leg 06/11/2020   Paroxysmal VT ([place]) 02/14/2020   Chronic constipation  10/06/2019   Moderate persistent asthma with exacerbation 08/29/2019   Genetic testing 08/14/2019   Ductal carcinoma in situ (DCIS) of right breast 06/15/2019   Arthralgia 04/11/2019   Malignant tumor of breast ([place]) 03/06/2019   Fatigue 03/06/2019   Status post total shoulder arthroplasty, right 03/10/2018   DDD (degenerative disc disease), cervical 11/19/2017   Peripheral edema 05/19/2017   Microcytic anemia 05/14/2017   Delayed sleep phase syndrome 10/29/2016   Primary osteoarthritis of left ankle 09/24/2016   Gastrointestinal food sensitivity 05/21/2016   History of arthroplasty of right shoulder 02/24/2016   Bilateral foot pain 01/22/2016   NASH (nonalcoholic steatohepatitis) 05/26/2015    Class: Stage 3   Nasal septal perforation 07/11/2014   Controlled diabetes mellitus type 2 with complications ([place]) 04/88/8916   Physical deconditioning 04/24/2014   Obesity, Class II, BMI 35-39.9, with comorbidity 06/09/2013   OSA (obstructive sleep apnea) 94/50/3888   Chronic systolic heart failure ([place]) 05/23/2013   Asthma, moderate persistent 04/18/2013   Chronic cough 04/18/2013   Breast cancer of upper-inner quadrant of left female breast ([place]) 03/18/2010   Hyperlipidemia 11/07/2009   Fibromyalgia 10/29/2009   Biventricular cardiac pacemaker in situ 10/23/2009   ALKALINE PHOSPHATASE, ELEVATED 10/13/2009   Hypothyroidism 10/03/2009   Benign essential hypertension 07/02/2009   Chronic back pain 07/02/2009    Immunization History  Administered Date(s) Administered   Fluad Quad(high Dose 65+) 04/11/2019, 05/02/2020, 04/18/2021, 04/24/2021   Influenza Split 05/10/2012, 05/04/2014  Influenza Whole 06/14/2009, 05/08/2010, 04/24/2011   Influenza, High Dose Seasonal PF 05/17/2017, 05/24/2018, 04/11/2019, 05/02/2020   Influenza,inj,Quad PF,6+ Mos 04/28/2013, 05/04/2014, 05/06/2015, 05/18/2016   Moderna SARS-COV2 Booster Vaccination 10/29/2021   Moderna Sars-Covid-2 Vaccination  11/01/2019, 11/29/2019, 07/19/2020   Pneumococcal Conjugate-13 05/25/2014   Pneumococcal Polysaccharide-23 11/28/2012, 12/28/2018   Td 08/24/2004, 03/07/2010   Tdap 07/14/2012   Zoster Recombinat (Shingrix) 10/06/2017, 01/16/2020   Zoster, Live 04/24/2011    Conditions to be addressed/monitored: HTN, HLD, and DMII  There are no care plans that you recently modified to display for this patient.       Medication Assistance:  Pending  Patient's preferred pharmacy is:  [id number] - [place], [place] - [address] AT [place] [address] [place] [address] Phone: [phone] Fax: [phone]  [facility] ([place], [place] [facility] [place] [address] [address] Phone: [phone] Fax: [facility] [phone] - [place] [address] [place] [address] Phone: [phone] Fax: [phone]  [facility] ([facility] ) - [place], [place] [address] Phone: [phone] Fax: [phone]   Uses pill box? Yes Pt endorses 100% compliance  Follow Up:  Patient agrees to Care Plan and Follow-up.  Plan: Telephone follow up appointment with care management team member scheduled for:  3 month  Larinda Buttery, PharmD Clinical Pharmacist [facility] [phone]

## 2022-01-22 NOTE — Patient Instructions (Signed)
Visit Information  Thank you for taking time to visit with me today. Please don't hesitate to contact me if I can be of assistance to you before our next scheduled telephone appointment.  Following are the goals we discussed today:  Patient Goals/Self-Care Activities Over the next 90 days, patient will:  take medications as prescribed  Follow Up Plan: Telephone follow up appointment with care management team member scheduled for: 3 month  Please call the care guide team at [phone] if you need to cancel or reschedule your appointment.   Patient verbalizes understanding of instructions and care plan provided today and agrees to view in [place]. Active MyChart status and patient understanding of how to access instructions and care plan via MyChart confirmed with patient.     Tanya Hogan

## 2022-01-23 ENCOUNTER — Encounter (HOSPITAL_COMMUNITY)
Admission: RE | Admit: 2022-01-23 | Discharge: 2022-01-23 | Disposition: A | Discharge status: Home / Self Care | Payer: Medicare Other | Source: Ambulatory Visit | Attending: Cardiology | Admitting: Cardiology

## 2022-01-23 DIAGNOSIS — I5022 Chronic systolic (congestive) heart failure: Secondary | ICD-10-CM | POA: Insufficient documentation

## 2022-01-24 ENCOUNTER — Other Ambulatory Visit: Payer: Self-pay | Admitting: Family Medicine

## 2022-01-26 ENCOUNTER — Encounter (HOSPITAL_COMMUNITY)
Admission: RE | Admit: 2022-01-26 | Discharge: 2022-01-26 | Disposition: A | Discharge status: Home / Self Care | Payer: Medicare Other | Source: Ambulatory Visit | Attending: Cardiology | Admitting: Cardiology

## 2022-01-26 DIAGNOSIS — I5022 Chronic systolic (congestive) heart failure: Secondary | ICD-10-CM

## 2022-01-26 NOTE — Progress Notes (Signed)
Cardiac Individual Treatment Plan  Patient Details  Name: Tanya Hogan MRN: [id number] Date of Birth: 1950/11/12 Referring Provider:   Flowsheet Row CARDIAC REHAB PHASE II ORIENTATION from 01/01/2022 in [place]  Referring Provider Gwyndolyn Kaufman, MD       Initial Encounter Date:  [place] PHASE II ORIENTATION from 01/01/2022 in [place]  Date 01/01/22       Visit Diagnosis: Heart failure, chronic systolic ([place])  Patient's Home Medications on Admission:  Current Outpatient Medications:    acetaminophen (TYLENOL) 500 MG tablet, Take 500-1,000 mg by mouth every 6 (six) hours as needed (for pain.)., Disp: , Rfl:    albuterol (PROVENTIL) (2.5 MG/3ML) 0.083% nebulizer solution, Take 2.5 mg by nebulization every 6 (six) hours as needed for wheezing or shortness of breath., Disp: , Rfl:    albuterol (VENTOLIN HFA) 108 (90 Base) MCG/ACT inhaler, Inhale 1-2 puffs into the lungs every 6 (six) hours as needed for wheezing or shortness of breath., Disp: 54 g, Rfl: 3   alendronate (FOSAMAX) 70 MG tablet, TAKE 1 TABLET BY MOUTH  EVERY 7 DAYS WITH A FULL  GLASS OF WATER ON AN EMPTY  STOMACH (Patient taking differently: Take 70 mg by mouth every Saturday.), Disp: 12 tablet, Rfl: 4   AMBULATORY NON FORMULARY MEDICATION, Medication Name: Tubing and supplies for nebulizer. Fax [phone], Disp: 1 vial, Rfl: 0   amiodarone (PACERONE) 200 MG tablet, TAKE ONE-HALF TABLET BY  MOUTH DAILY, Disp: 45 tablet, Rfl: 0   B Complex-C (B-COMPLEX WITH VITAMIN C) tablet, Take 1 tablet by mouth in the morning., Disp: , Rfl:    bumetanide (BUMEX) 1 MG tablet, TAKE 1 TABLET BY MOUTH TWICE  DAILY, Disp: 180 tablet, Rfl: 3   carvedilol (COREG) 3.125 MG tablet, Take 1 tablet (3.125 mg total) by mouth 2 (two) times daily., Disp: 180 tablet, Rfl: 3   Continuous Blood Gluc Receiver (FREESTYLE LIBRE 2 READER) DEVI, by Does not apply  route., Disp: , Rfl:    Continuous Blood Gluc Sensor (FREESTYLE LIBRE 2 SENSOR) MISC, by Does not apply route., Disp: , Rfl:    EPINEPHrine 0.3 mg/0.3 mL IJ SOAJ injection, Inject 0.3 mg into the muscle as needed for anaphylaxis. Then call 911, Disp: 1 each, Rfl: 1   fexofenadine (ALLEGRA) 180 MG tablet, Take 180 mg by mouth in the morning., Disp: , Rfl:    fluticasone-salmeterol (ADVAIR HFA) 115-21 MCG/ACT inhaler, Inhale 2 puffs into the lungs 2 (two) times daily., Disp: , Rfl:    JARDIANCE 10 MG TABS tablet, TAKE 1 TABLET(10 MG) BY MOUTH DAILY BEFORE BREAKFAST, Disp: 30 tablet, Rfl: 4   levothyroxine (SYNTHROID) 88 MCG tablet, TAKE 1 TABLET(88 MCG) BY MOUTH DAILY, Disp: 90 tablet, Rfl: 0   LINZESS 72 MCG capsule, Take 72-144 mcg by mouth 2 (two) times daily as needed (constipation.)., Disp: , Rfl:    Multiple Vitamins-Minerals (OCUVITE EYE + MULTI) TABS, Take 1 tablet by mouth in the morning., Disp: , Rfl:    PAPAYA ENZYME PO, Take 1 capsule by mouth in the morning., Disp: , Rfl:    potassium chloride (KLOR-CON M) 10 MEQ tablet, TAKE 1 TABLET(10 MEQ) BY MOUTH DAILY, Disp: 90 tablet, Rfl: 0   pravastatin (PRAVACHOL) 40 MG tablet, TAKE 1 TABLET BY MOUTH AT  BEDTIME, Disp: 90 tablet, Rfl: 3   Probiotic Product ([employer] PROBIOTIC) CAPS, Take 2 capsules by mouth in the morning., Disp: , Rfl:  Semaglutide,0.25 or 0.5MG/DOS, (OZEMPIC, 0.25 OR 0.5 MG/DOSE,) 2 MG/1.5ML SOPN, Inject 0.25 mg into the skin every Saturday., Disp: , Rfl:    Tiotropium Bromide Monohydrate (SPIRIVA RESPIMAT) 1.25 MCG/ACT AERS, Inhale 2 puffs into the lungs daily., Disp: , Rfl:    valACYclovir (VALTREX) 1000 MG tablet, Take 1 tablet (1,000 mg total) by mouth 2 (two) times daily. (Patient taking differently: Take 1,000 mg by mouth 2 (two) times daily as needed (fever blisters/cold sores.).), Disp: 14 tablet, Rfl: 2  Past Medical History: Past Medical History:  Diagnosis Date   Alkaline phosphatase elevation    Asthma    Breast  cancer ([place]) 2011   Carpal tunnel syndrome    Carpal tunnel syndrome    bilateral   CHF (congestive heart failure) (HCC)    Cirrhosis, non-alcoholic (HCC)    Closed fracture of unspecified part of radius (alone)    DDD (degenerative disc disease), lumbar    Depression    pt denies   Diabetes mellitus without complication ([place])    Enlarged heart    Fibromyalgia    GERD (gastroesophageal reflux disease)    History of kidney stones    HTN (hypertension)    Hypothyroidism    IBS (irritable bowel syndrome)    LBBB (left bundle branch block)    Microcytic anemia 05/14/2017   Pneumonia    Presence of permanent cardiac pacemaker    PVD (peripheral vascular disease) ([place])    2019   Sleep apnea    uses cpap    Tobacco Use: Social History   Tobacco Use  Smoking Status Former   Packs/day: 0.00   Years: 0.00   Pack years: 0.00   Types: Cigarettes   Quit date: 08/24/1966   Years since quitting: 55.4  Smokeless Tobacco Never    Labs: Review Flowsheet        Latest Ref Rng & Units 09/16/2020 01/14/2021 04/16/2021 07/21/2021  Labs for ITP Cardiac and Pulmonary Rehab  Cholestrol <200 mg/dL   168     LDL (calc) mg/dL (calc)   74     HDL-C > OR = 50 mg/dL   73     Trlycerides <150 mg/dL   120     Hemoglobin A1c 4.0 - 5.6 % 6.0   6.2   6.4   6.4       01/05/2022  Labs for ITP Cardiac and Pulmonary Rehab  Cholestrol   LDL (calc)   HDL-C   Trlycerides   Hemoglobin A1c 6.1            Capillary Blood Glucose: Lab Results  Component Value Date   GLUCAP 105 (H) 01/09/2022   GLUCAP 169 (H) 01/09/2022   GLUCAP 92 01/07/2022   GLUCAP 107 (H) 01/01/2022   GLUCAP 113 (H) 11/03/2021     Exercise Target Goals: Exercise Program Goal: Individual exercise prescription set using results from initial 6 min walk test and THRR while considering  patient's activity barriers and safety.   Exercise Prescription Goal: Initial exercise prescription builds to 30-45 minutes a day of  aerobic activity, 2-3 days per week.  Home exercise guidelines will be given to patient during program as part of exercise prescription that the participant will acknowledge.  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 HR 97 bpm     Max Ex. BP 124/74     2 Minute Post BP 120/78       Interval HR   1 Minute HR 93     2 Minute HR 95     3 Minute HR 97     4 Minute HR 97     5 Minute HR 97     6 Minute HR 97     2 Minute Post HR 67     Interval Heart Rate? Yes       Interval Oxygen   Interval Oxygen? Yes     Baseline Oxygen Saturation % 97 %     1 Minute Oxygen Saturation % 98 %     1 Minute Liters of Oxygen 0 L     2 Minute Oxygen Saturation % 97 %     2 Minute Liters of Oxygen 0 L     3 Minute Oxygen Saturation % 97 %     3 Minute Liters of Oxygen 0 L     4 Minute Oxygen Saturation % 97 %     4 Minute Liters of Oxygen 0 L     5 Minute Oxygen Saturation % 97 %     5 Minute Liters of Oxygen 0 L     6 Minute Oxygen Saturation % 97 %     6 Minute Liters of Oxygen 0 L     2 Minute Post Oxygen Saturation % 97 %               Oxygen Initial Assessment:   Oxygen Re-Evaluation:   Oxygen Discharge (Final Oxygen Re-Evaluation):   Initial Exercise Prescription:  Initial Exercise Prescription - 01/01/22 1500       Date of Initial Exercise RX and Referring Provider   Date 01/01/22    Referring Provider Gwyndolyn Kaufman, MD    Expected Discharge Date 02/27/22      NuStep   Level 1    SPM 75    Minutes 25    METs 1.5      Prescription Details   Frequency (times per week) 3    Duration Progress to 30 minutes of continuous aerobic without signs/symptoms of physical distress      Intensity   THRR 40-80% of Max Heartrate 60-118    Ratings of Perceived Exertion 11-13    Perceived Dyspnea 0-4      Progression   Progression Continue progressive overload as per policy without signs/symptoms or physical distress.  Resistance Training   Training Prescription Yes    Weight 1 lb    Reps 10-15             Perform Capillary Blood Glucose checks as needed.  Exercise Prescription Changes:   Exercise Prescription Changes     Row Name 01/09/22 1027 01/12/22 1019 01/26/22 1011         Response to Exercise   Blood Pressure (Admit) 108/70 114/78 122/60     Blood Pressure (Exercise) 118/70 122/84 128/70     Blood Pressure (Exit) 118/72 104/72 106/78     Heart Rate (Admit) 63 bpm 62 bpm 79 bpm     Heart Rate (Exercise) 87 bpm 86 bpm 100 bpm     Heart Rate (Exit) 68 bpm 60 bpm 68 bpm     Rating of Perceived Exertion (Exercise) _0 Symptoms None None None     Comments Off to a good start with exercise. Performed resistance exercises without the weights today. --     Duration Progress to 30 minutes of  aerobic without signs/symptoms of physical distress Progress to 30 minutes of  aerobic without signs/symptoms of physical distress Progress to 30 minutes of  aerobic without signs/symptoms of physical distress     Intensity THRR unchanged THRR unchanged THRR unchanged       Progression    Progression Continue to progress workloads to maintain intensity without signs/symptoms of physical distress. Continue to progress workloads to maintain intensity without signs/symptoms of physical distress. Continue to progress workloads to maintain intensity without signs/symptoms of physical distress.     Average METs 1.5 1.6 2       Resistance Training   Training Prescription Yes Yes Yes     Weight 1 lb 0 1 lb     Reps 10-15 10-15 10-15     Time 10 Minutes 10 Minutes 10 Minutes       Interval Training   Interval Training No No No       NuStep   Level _1 SPM 56 64 85     Minutes _2 METs 1.5 1.6 2              Exercise Comments:   Exercise Comments     Row Name 01/09/22 1134 01/12/22 1113 01/14/22 1106 01/26/22 1013     Exercise Comments Patient tolerated low intensity exercise well without symptoms. Reviewed METs with patient. Reviewed METs and goals with patient. Reviewed METs with patient.             Exercise Goals and Review:   Exercise Goals     Row Name 01/01/22 1502             Exercise Goals   Increase Physical Activity Yes       Intervention Provide advice, education, support and counseling about physical activity/exercise needs.;Develop an individualized exercise prescription for aerobic and resistive training based on initial evaluation findings, risk stratification, comorbidities and participant's personal goals.       Expected Outcomes Short Term: Attend rehab on a regular basis to increase amount of physical activity.;Long Term: Add in home exercise to make exercise part of routine and to increase amount of physical activity.;Long Term: Exercising regularly at least 3-5 days a week.       Increase Strength and Stamina Yes       Intervention Provide advice, education, support and counseling  about physical activity/exercise needs.;Develop an individualized exercise prescription for aerobic and resistive training based on initial  evaluation findings, risk stratification, comorbidities and participant's personal goals.       Expected Outcomes Short Term: Increase workloads from initial exercise prescription for resistance, speed, and METs.;Short Term: Perform resistance training exercises routinely during rehab and add in resistance training at home;Long Term: Improve cardiorespiratory fitness, muscular endurance and strength as measured by increased METs and functional capacity (6MWT)       Able to understand and use rate of perceived exertion (RPE) scale Yes       Intervention Provide education and explanation on how to use RPE scale       Expected Outcomes Short Term: Able to use RPE daily in rehab to express subjective intensity level;Long Term:  Able to use RPE to guide intensity level when exercising independently       Knowledge and understanding of Target Heart Rate Range (THRR) Yes       Intervention Provide education and explanation of THRR including how the numbers were predicted and where they are located for reference       Expected Outcomes Short Term: Able to state/look up THRR;Long Term: Able to use THRR to govern intensity when exercising independently;Short Term: Able to use daily as guideline for intensity in rehab       Understanding of Exercise Prescription Yes       Intervention Provide education, explanation, and written materials on patient's individual exercise prescription       Expected Outcomes Short Term: Able to explain program exercise prescription;Long Term: Able to explain home exercise prescription to exercise independently                Exercise Goals Re-Evaluation :  Exercise Goals Re-Evaluation     [place] Name 01/09/22 1134 01/14/22 1106           Exercise Goal Re-Evaluation   Exercise Goals Review Increase Physical Activity;Able to understand and use rate of perceived exertion (RPE) scale Increase Physical Activity;Able to understand and use rate of perceived exertion (RPE) scale       Comments Patient able to understand and use RPE scale appropriately. Patient was clipping and pulling vines yesterday and "gave out". Discussed increasing MET level and endurance increasing as she progresses with exericse. Patient asked how often she should be doing the stretching exercises at home, and I advised that she could do the stretches everyday or as tolerated but hand weights no more than every other day.      Expected Outcomes Progress workloads as tolerated to help increase strength and stamoina and decrease shortness of breath. Continue to progress workloads as tolerated to help increse endurance to do gardening and other ADLs.               Discharge Exercise Prescription (Final Exercise Prescription Changes):  Exercise Prescription Changes - 01/26/22 1011       Response to Exercise   Blood Pressure (Admit) 122/60    Blood Pressure (Exercise) 128/70    Blood Pressure (Exit) 106/78    Heart Rate (Admit) 79 bpm    Heart Rate (Exercise) 100 bpm    Heart Rate (Exit) 68 bpm    Rating of Perceived Exertion (Exercise) 12    Symptoms None    Duration Progress to 30 minutes of  aerobic without signs/symptoms of physical distress    Intensity THRR unchanged      Progression   Progression Continue to progress  workloads to maintain intensity without signs/symptoms of physical distress.    Average METs 2      Resistance Training   Training Prescription Yes    Weight 1 lb    Reps 10-15    Time 10 Minutes      Interval Training   Interval Training No      NuStep   Level 1    SPM 85    Minutes 27    METs 2             Nutrition:  Target Goals: Understanding of nutrition guidelines, daily intake of sodium <1531m, cholesterol <2039m calories 30% from fat and 7% or less from saturated fats, daily to have 5 or more servings of fruits and vegetables.  Biometrics:  Pre Biometrics - 01/01/22 1045       Pre Biometrics   Waist Circumference 47.75 inches    Hip  Circumference 49.5 inches    Waist to Hip Ratio 0.96 %    Triceps Skinfold 34 mm    % Body Fat 50.5 %    Grip Strength 14 kg    Flexibility --   Not performed due to back issues   Single Leg Stand 2.06 seconds   Pt high fall risk             Nutrition Therapy Plan and Nutrition Goals:  Nutrition Therapy & Goals - 01/07/22 1309       Nutrition Therapy   Diet Heart Healthy diet    Drug/Food Interactions Statins/Certain Fruits      Personal Nutrition Goals   Nutrition Goal Patient to implement heart healthy eating patterns with fruits/vegetables, whole grains, low fat dairy, lean protein/plant protein.    Personal Goal #2 Patient to reduce sodium intake to <230084mer day    Personal Goal #3 Patient to identify food sources of refined carbohydrates, saturated fats, trans fats, and sodium      Intervention Plan   Intervention Prescribe, educate and counsel regarding individualized specific dietary modifications aiming towards targeted core components such as weight, hypertension, lipid management, diabetes, heart failure and other comorbidities.    Expected Outcomes Short Term Goal: Understand basic principles of dietary content, such as calories, fat, sodium, cholesterol and nutrients.;Long Term Goal: Adherence to prescribed nutrition plan.             Nutrition Assessments:  Nutrition Assessments - 01/07/22 1346       Rate Your Plate Scores   Pre Score 49            MEDIFICTS Score Key: ?70 Need to make dietary changes  40-70 Heart Healthy Diet ? 40 Therapeutic Level Cholesterol Diet   Flowsheet Row CARDIAC REHAB PHASE II EXERC[place] Total Score on Admission 49      Picture Your Plate Scores: <40<76healthy dietary pattern with much room for improvement. 41-50 Dietary pattern unlikely to meet recommendations for good health and room for improvement. 51-60 More healthful dietary  pattern, with some room for improvement.  >60 Healthy dietary pattern, although there may be some specific behaviors that could be improved.    Nutrition Goals Re-Evaluation:   Nutrition Goals Re-Evaluation:   Nutrition Goals Discharge (Final Nutrition Goals Re-Evaluation):   Psychosocial: Target Goals: Acknowledge presence or absence of significant depression and/or stress, maximize coping skills, provide positive support system. Participant is able to verbalize types and ability to use techniques and skills needed  for reducing stress and depression.  Initial Review & Psychosocial Screening:  Initial Psych Review & Screening - 01/01/22 1200       Initial Review   Current issues with Current Stress Concerns    Source of Stress Concerns Chronic Illness;Unable to participate in former interests or hobbies;Unable to perform yard/household activities    Comments Tanya Hogan has stressors regarding health, family and finances. Tanya Hogan's ex husband recenlty passed away      [place]? Yes   Tanya Hogan lives with her brother and has a daughter who lives in the area for support     Barriers   Psychosocial barriers to participate in program The patient should benefit from training in stress management and relaxation.      Screening Interventions   Interventions Encouraged to exercise;To provide support and resources with identified psychosocial needs    Expected Outcomes Long Term Goal: Stressors or current issues are controlled or eliminated.             Quality of Life Scores:  Quality of Life - 01/01/22 1451       Quality of Life   Select Quality of Life      Quality of Life Scores   Health/Function Pre 22.83 %    Socioeconomic Pre 20.31 %    Psych/Spiritual Pre 25.71 %    Family Pre 22.9 %    GLOBAL Pre 22.84 %            Scores of 19 and below usually indicate a poorer quality of life in these areas.  A difference of  2-3 points is a clinically  meaningful difference.  A difference of 2-3 points in the total score of the Quality of Life Index has been associated with significant improvement in overall quality of life, self-image, physical symptoms, and general health in studies assessing change in quality of life.  PHQ-9: Review Flowsheet        01/01/2022 10/16/2021 09/23/2021 09/16/2021 07/21/2021  Depression screen PHQ 2/9  Decreased Interest 0 0 0 0 0  Down, Depressed, Hopeless 0 0 0 0 0  PHQ - 2 Score 0 0 0 0 0         Interpretation of Total Score  Total Score Depression Severity:  1-4 = Minimal depression, 5-9 = Mild depression, 10-14 = Moderate depression, 15-19 = Moderately severe depression, 20-27 = Severe depression   Psychosocial Evaluation and Intervention:   Psychosocial Re-Evaluation:  Psychosocial Re-Evaluation     [place] Name 01/09/22 1431 01/26/22 1752           Psychosocial Re-Evaluation   Current issues with Current Stress Concerns Current Stress Concerns      Comments Tanya Hogan did voice she was concerned about her CBG's running to low other wise not other concerns were voiced Tanya Hogan has not voiced any increased concersn or stressors since participating in phase 2.      Expected Outcomes Tanya Hogan will continue to participate in phase 2 cardiac rehab for exercise, nutrition and lifestyle modifications. Tanya Hogan will continue to participate in phase 2 cardiac rehab for exercise, nutrition and lifestyle modifications.      Interventions Stress management education;Relaxation education;Encouraged to attend Cardiac Rehabilitation for the exercise Stress management education;Relaxation education;Encouraged to attend Cardiac Rehabilitation for the exercise      Continue Psychosocial Services  Follow up required by staff Follow up required by staff        Initial Review   Source of Stress Concerns  Unable to perform yard/household activities;Unable to participate in former interests or hobbies;Chronic Illness Unable to perform  yard/household activities;Unable to participate in former interests or hobbies;Chronic Illness      Comments Will continue to monitor and offer support as needed. Will continue to monitor and offer support as needed.               Psychosocial Discharge (Final Psychosocial Re-Evaluation):  Psychosocial Re-Evaluation - 01/26/22 1752       Psychosocial Re-Evaluation   Current issues with Current Stress Concerns    Comments Tanya Hogan has not voiced any increased concersn or stressors since participating in phase 2.    Expected Outcomes Tanya Hogan will continue to participate in phase 2 cardiac rehab for exercise, nutrition and lifestyle modifications.    Interventions Stress management education;Relaxation education;Encouraged to attend Cardiac Rehabilitation for the exercise    Continue Psychosocial Services  Follow up required by staff      Initial Review   Source of Stress Concerns Unable to perform yard/household activities;Unable to participate in former interests or hobbies;Chronic Illness    Comments Will continue to monitor and offer support as needed.             Vocational Rehabilitation: Provide vocational rehab assistance to qualifying candidates.   Vocational Rehab Evaluation & Intervention:  Vocational Rehab - 01/01/22 1211       Initial Vocational Rehab Evaluation & Intervention   Assessment shows need for Vocational Rehabilitation No   Tanya Hogan is retired and does not need vocational rehab at this time            Education: Education Goals: Education classes will be provided on a weekly basis, covering required topics. Participant will state understanding/return demonstration of topics presented.  Learning Barriers/Preferences:  Learning Barriers/Preferences - 01/01/22 1500       Learning Barriers/Preferences   Learning Barriers Sight   wears glasses   Learning Preferences Audio;Computer/Internet;Group Instruction;Individual Instruction;Pictoral;Skilled  Demonstration;Verbal Instruction;Video;Written Material             Education Topics: Count Your Pulse:  -Group instruction provided by verbal instruction, demonstration, patient participation and written materials to support subject.  Instructors address importance of being able to find your pulse and how to count your pulse when at home without a heart monitor.  Patients get hands on experience counting their pulse with staff help and individually.   Heart Attack, Angina, and Risk Factor Modification:  -Group instruction provided by verbal instruction, video, and written materials to support subject.  Instructors address signs and symptoms of angina and heart attacks.    Also discuss risk factors for heart disease and how to make changes to improve heart health risk factors.   Functional Fitness:  -Group instruction provided by verbal instruction, demonstration, patient participation, and written materials to support subject.  Instructors address safety measures for doing things around the house.  Discuss how to get up and down off the floor, how to pick things up properly, how to safely get out of a chair without assistance, and balance training.   Meditation and Mindfulness:  -Group instruction provided by verbal instruction, patient participation, and written materials to support subject.  Instructor addresses importance of mindfulness and meditation practice to help reduce stress and improve awareness.  Instructor also leads participants through a meditation exercise.    Stretching for Flexibility and Mobility:  -Group instruction provided by verbal instruction, patient participation, and written materials to support subject.  Instructors lead participants through series of stretches  that are designed to increase flexibility thus improving mobility.  These stretches are additional exercise for major muscle groups that are typically performed during regular warm up and cool  down.   Hands Only CPR:  -Group verbal, video, and participation provides a basic overview of AHA guidelines for community CPR. Role-play of emergencies allow participants the opportunity to practice calling for help and chest compression technique with discussion of AED use.   Hypertension: -Group verbal and written instruction that provides a basic overview of hypertension including the most recent diagnostic guidelines, risk factor reduction with self-care instructions and medication management.    Nutrition I class: Heart Healthy Eating:  -Group instruction provided by PowerPoint slides, verbal discussion, and written materials to support subject matter. The instructor gives an explanation and review of the Therapeutic Lifestyle Changes diet recommendations, which includes a discussion on lipid goals, dietary fat, sodium, fiber, plant stanol/sterol esters, sugar, and the components of a well-balanced, healthy diet.   Nutrition II class: Lifestyle Skills:  -Group instruction provided by PowerPoint slides, verbal discussion, and written materials to support subject matter. The instructor gives an explanation and review of label reading, grocery shopping for heart health, heart healthy recipe modifications, and ways to make healthier choices when eating out.   Diabetes Question & Answer:  -Group instruction provided by PowerPoint slides, verbal discussion, and written materials to support subject matter. The instructor gives an explanation and review of diabetes co-morbidities, pre- and post-prandial blood glucose goals, pre-exercise blood glucose goals, signs, symptoms, and treatment of hypoglycemia and hyperglycemia, and foot care basics.   Diabetes Blitz:  -Group instruction provided by PowerPoint slides, verbal discussion, and written materials to support subject matter. The instructor gives an explanation and review of the physiology behind type 1 and type 2 diabetes, diabetes  medications and rational behind using different medications, pre- and post-prandial blood glucose recommendations and Hemoglobin A1c goals, diabetes diet, and exercise including blood glucose guidelines for exercising safely.    Portion Distortion:  -Group instruction provided by PowerPoint slides, verbal discussion, written materials, and food models to support subject matter. The instructor gives an explanation of serving size versus portion size, changes in portions sizes over the last 20 years, and what consists of a serving from each food group.   Stress Management:  -Group instruction provided by verbal instruction, video, and written materials to support subject matter.  Instructors review role of stress in heart disease and how to cope with stress positively.     Exercising on Your Own:  -Group instruction provided by verbal instruction, power point, and written materials to support subject.  Instructors discuss benefits of exercise, components of exercise, frequency and intensity of exercise, and end points for exercise.  Also discuss use of nitroglycerin and activating EMS.  Review options of places to exercise outside of rehab.  Review guidelines for sex with heart disease.   Cardiac Drugs I:  -Group instruction provided by verbal instruction and written materials to support subject.  Instructor reviews cardiac drug classes: antiplatelets, anticoagulants, beta blockers, and statins.  Instructor discusses reasons, side effects, and lifestyle considerations for each drug class.   Cardiac Drugs II:  -Group instruction provided by verbal instruction and written materials to support subject.  Instructor reviews cardiac drug classes: angiotensin converting enzyme inhibitors (ACE-I), angiotensin II receptor blockers (ARBs), nitrates, and calcium channel blockers.  Instructor discusses reasons, side effects, and lifestyle considerations for each drug class.   Anatomy and Physiology of the  Circulatory System:  Group verbal and written instruction and models provide basic cardiac anatomy and physiology, with the coronary electrical and arterial systems. Review of: AMI, Angina, Valve disease, Heart Failure, Peripheral Artery Disease, Cardiac Arrhythmia, Pacemakers, and the ICD.   Other Education:  -Group or individual verbal, written, or video instructions that support the educational goals of the cardiac rehab program.   Holiday Eating Survival Tips:  -Group instruction provided by PowerPoint slides, verbal discussion, and written materials to support subject matter. The instructor gives patients tips, tricks, and techniques to help them not only survive but enjoy the holidays despite the onslaught of food that accompanies the holidays.   Knowledge Questionnaire Score:  Knowledge Questionnaire Score - 01/01/22 1452       Knowledge Questionnaire Score   Pre Score 19/24             Core Components/Risk Factors/Patient Goals at Admission:  Personal Goals and Risk Factors at Admission - 01/01/22 1456       Core Components/Risk Factors/Patient Goals on Admission    Weight Management Yes;Obesity;Weight Loss    Intervention Weight Management: Develop a combined nutrition and exercise program designed to reach desired caloric intake, while maintaining appropriate intake of nutrient and fiber, sodium and fats, and appropriate energy expenditure required for the weight goal.;Weight Management: Provide education and appropriate resources to help participant work on and attain dietary goals.;Weight Management/Obesity: Establish reasonable short term and long term weight goals.;Obesity: Provide education and appropriate resources to help participant work on and attain dietary goals.    Admit Weight 194 lb 14.2 oz (88.4 kg)    Expected Outcomes Short Term: Continue to assess and modify interventions until short term weight is achieved;Long Term: Adherence to nutrition and physical  activity/exercise program aimed toward attainment of established weight goal;Weight Maintenance: Understanding of the daily nutrition guidelines, which includes 25-35% calories from fat, 7% or less cal from saturated fats, less than 258m cholesterol, less than 1.5gm of sodium, & 5 or more servings of fruits and vegetables daily;Weight Loss: Understanding of general recommendations for a balanced deficit meal plan, which promotes 1-2 lb weight loss per week and includes a negative energy balance of [id number] kcal/d;Understanding recommendations for meals to include 15-35% energy as protein, 25-35% energy from fat, 35-60% energy from carbohydrates, less than 2026mof dietary cholesterol, 20-35 gm of total fiber daily;Understanding of distribution of calorie intake throughout the day with the consumption of 4-5 meals/snacks    Diabetes Yes    Intervention Provide education about signs/symptoms and action to take for hypo/hyperglycemia.;Provide education about proper nutrition, including hydration, and aerobic/resistive exercise prescription along with prescribed medications to achieve blood glucose in normal ranges: Fasting glucose 65-99 mg/dL    Expected Outcomes Long Term: Attainment of HbA1C < 7%.;Short Term: Participant verbalizes understanding of the signs/symptoms and immediate care of hyper/hypoglycemia, proper foot care and importance of medication, aerobic/resistive exercise and nutrition plan for blood glucose control.    Heart Failure Yes    Intervention Provide a combined exercise and nutrition program that is supplemented with education, support and counseling about heart failure. Directed toward relieving symptoms such as shortness of breath, decreased exercise tolerance, and extremity edema.    Expected Outcomes Improve functional capacity of life;Short term: Attendance in program 2-3 days a week with increased exercise capacity. Reported lower sodium intake. Reported increased fruit and vegetable  intake. Reports medication compliance.;Short term: Daily weights obtained and reported for increase. Utilizing diuretic protocols set by physician.;Long term: Adoption of self-care skills and  reduction of barriers for early signs and symptoms recognition and intervention leading to self-care maintenance.    Hypertension Yes    Intervention Provide education on lifestyle modifcations including regular physical activity/exercise, weight management, moderate sodium restriction and increased consumption of fresh fruit, vegetables, and low fat dairy, alcohol moderation, and smoking cessation.;Monitor prescription use compliance.    Expected Outcomes Short Term: Continued assessment and intervention until BP is < 140/24m HG in hypertensive participants. < 130/817mHG in hypertensive participants with diabetes, heart failure or chronic kidney disease.;Long Term: Maintenance of blood pressure at goal levels.    Lipids Yes    Intervention Provide education and support for participant on nutrition & aerobic/resistive exercise along with prescribed medications to achieve LDL <7058mHDL >37m38m  Expected Outcomes Short Term: Participant states understanding of desired cholesterol values and is compliant with medications prescribed. Participant is following exercise prescription and nutrition guidelines.;Long Term: Cholesterol controlled with medications as prescribed, with individualized exercise RX and with personalized nutrition plan. Value goals: LDL < 70mg75mL > 40 mg.             Core Components/Risk Factors/Patient Goals Review:   Goals and Risk Factor Review     Row Name 01/09/22 1435 01/26/22 1754           Core Components/Risk Factors/Patient Goals Review   Personal Goals Review Weight Management/Obesity;Heart Failure;Stress;Diabetes;Lipids;Hypertension Weight Management/Obesity;Heart Failure;Stress;Diabetes;Lipids;Hypertension      Review Tanya [facility] cardiac rehab and did well with  exercise. Vital signs stable. Patient was given a snack prior to exercise. Tanya Hogan Tanya Roysbeen doing well with exercise at cardiac rehab. Vital signs stable and CBG's have been stable. Tanya Hogan Tanya Roysot taking Jardiance on her exercise days per Dr MetheMadilyn Fireman  Expected Outcomes Judywill continue to participate in phase 2 cardiac rehab for exercise, nutrition and lifestyle modifications Judywill continue to participate in phase 2 cardiac rehab for exercise, nutrition and lifestyle modifications               Core Components/Risk Factors/Patient Goals at Discharge (Final Review):   Goals and Risk Factor Review - 01/26/22 1754       Core Components/Risk Factors/Patient Goals Review   Personal Goals Review Weight Management/Obesity;Heart Failure;Stress;Diabetes;Lipids;Hypertension    Review Tanya Hogan Tanya Roysbeen doing well with exercise at cardiac rehab. Vital signs stable and CBG's have been stable. Tanya Hogan Tanya Roysot taking Jardiance on her exercise days per Dr MetheMadilyn FiremanExpected Outcomes Judywill continue to participate in phase 2 cardiac rehab for exercise, nutrition and lifestyle modifications             ITP Comments:  ITP Comments     Row Name 01/01/22 1136 01/09/22 1427 01/26/22 1752       ITP Comments Dr TraciFransico HimMedical Director 30 Day ITP Review. Tanya Hogan [facility] exercise at cardiac rehab on 01/09/22 and did well with exercise. 30 Day ITP Review. Tanya Hogan Tanya Roysgood attendance and participation in phase 2 cardiac rehab              Comments: See ITP Comments

## 2022-01-28 ENCOUNTER — Encounter (HOSPITAL_COMMUNITY)
Admission: RE | Admit: 2022-01-28 | Discharge: 2022-01-28 | Disposition: A | Discharge status: Home / Self Care | Payer: Medicare Other | Source: Ambulatory Visit | Attending: Cardiology | Admitting: Cardiology

## 2022-01-28 ENCOUNTER — Telehealth: Payer: Self-pay | Admitting: Cardiology

## 2022-01-28 ENCOUNTER — Other Ambulatory Visit: Payer: Self-pay | Admitting: *Deleted

## 2022-01-28 DIAGNOSIS — E875 Hyperkalemia: Secondary | ICD-10-CM | POA: Diagnosis not present

## 2022-01-28 DIAGNOSIS — J019 Acute sinusitis, unspecified: Secondary | ICD-10-CM | POA: Diagnosis not present

## 2022-01-28 DIAGNOSIS — D649 Anemia, unspecified: Secondary | ICD-10-CM | POA: Diagnosis not present

## 2022-01-28 DIAGNOSIS — I5022 Chronic systolic (congestive) heart failure: Secondary | ICD-10-CM

## 2022-01-28 NOTE — Telephone Encounter (Signed)
Pt in cardiac rehab and was hearing voices from radio and she was demanding her pacemaker be taken out.  The RN was able to calm pt and plan is to take her to ER for eval.

## 2022-01-28 NOTE — Progress Notes (Signed)
Incomplete Session Note  Patient Details  Name: Tanya Hogan MRN: [id number] Date of Birth: [birth year] Referring Provider:   Flowsheet Row CARDIAC REHAB PHASE II ORIENTATION from 01/01/2022 in [facility]  Referring Provider Gwyndolyn Kaufman, MD       [name] did not complete her rehab session.  Patient sitting in the lobby crying saying that she wants her pacemaker taken out right now. I don't like electronics.  "My car is being hacked. I have turned off the blue tooth on my phone and turned my cell phone off. I want to talk do someone about my pacemaker. Vital signs obtained. Blood pressure 124/70 heart rate 78. Oxygen saturation 96% on room air CBG 118 by CGM. " I was told not to walk up any hills and it has taken two days for me to recover from walking from the parking lot into this building." From walking to the parking lot at cardiac rehab. I paged [name] NP to notify about today's events. Mickel Baas advised that ms Gilford Rile gon to the ED for further evaluation.  I advised Ms Tolen to go to the ED. Patient became tearful. "I am not going  to the ED, I dont have the money." I am already on a payment plan. Rapid response called Kelli Churn RN came and talked with Ms Sandra. The patient still refused to go to the ED. Patient said she agreed to talk to Dr Lovena Le at her next scheduled appointment. Will cancel exercise appointments until she has been evaluated in the office. Next scheduled appointment is on 02/20/22. [name] left cardiac rehab without further complaints using her rollator. Ms Cordoba said she will walk to her car to blow off some steam. [name] NP re paged and notified. Harrell Gave RN BSN

## 2022-01-28 NOTE — Progress Notes (Signed)
Recommended that she go to the emergency room for evaluation.  Patient refused care in the Emergency Department.  Per Staff she is more calm than when she arrived. She is still periodically tearful and paranoid about any electronics.  She states that she feels vibrations in her neck and face when she tries to sleep.

## 2022-01-29 LAB — BASIC METABOLIC PANEL WITH GFR
BUN/Creatinine Ratio: 23 (calc) — ABNORMAL HIGH (ref 6–22)
BUN: 25 mg/dL (ref 7–25)
CO2: 28 mmol/L (ref 20–32)
Calcium: 10.7 mg/dL — ABNORMAL HIGH (ref 8.6–10.4)
Chloride: 104 mmol/L (ref 98–110)
Creat: 1.11 mg/dL — ABNORMAL HIGH (ref 0.60–1.00)
Glucose, Bld: 91 mg/dL (ref 65–99)
Potassium: 4.7 mmol/L (ref 3.5–5.3)
Sodium: 141 mmol/L (ref 135–146)
eGFR: 53 mL/min/{1.73_m2} — ABNORMAL LOW (ref >=60)

## 2022-01-29 LAB — IRON,TIBC AND FERRITIN PANEL
%SAT: 29 % (calc) (ref 16–45)
Ferritin: 257 ng/mL (ref 16–288)
Iron: 92 ug/dL (ref 45–160)
TIBC: 313 mcg/dL (calc) (ref 250–450)

## 2022-01-29 NOTE — Progress Notes (Signed)
Hi [name], kidney function is stable.  Calcium level is just a little elevated but I do think you take extra calcium.  Iron levels look fantastic.

## 2022-01-30 ENCOUNTER — Telehealth (HOSPITAL_COMMUNITY): Payer: Self-pay | Admitting: *Deleted

## 2022-01-30 ENCOUNTER — Encounter (HOSPITAL_COMMUNITY): Payer: Medicare Other

## 2022-01-30 ENCOUNTER — Telehealth: Payer: Self-pay | Admitting: Cardiology

## 2022-01-30 NOTE — Telephone Encounter (Signed)
Left message to call cardiac rehab.Harrell Gave RN BSN

## 2022-01-30 NOTE — Telephone Encounter (Signed)
The patient has been notified of the result and verbalized understanding.  All questions (if any) were answered. Nuala Alpha, LPN 03/01/2177 37:54 PM

## 2022-01-30 NOTE — Telephone Encounter (Signed)
Patient came to exercise today. I notified Tanya Hogan that she will not be able to return to exercise until she follows up with Dr Lovena Le. Patient states understanding.Harrell Gave RN BSN

## 2022-01-30 NOTE — Telephone Encounter (Signed)
Patient would like a call back to go over her echo results. To refresh her memory.

## 2022-02-02 ENCOUNTER — Encounter (HOSPITAL_COMMUNITY): Payer: Medicare Other

## 2022-02-03 ENCOUNTER — Telehealth: Payer: Self-pay | Admitting: Internal Medicine

## 2022-02-03 ENCOUNTER — Ambulatory Visit (INDEPENDENT_AMBULATORY_CARE_PROVIDER_SITE_OTHER): Payer: Medicare Other

## 2022-02-03 DIAGNOSIS — I442 Atrioventricular block, complete: Secondary | ICD-10-CM

## 2022-02-03 LAB — CUP PACEART REMOTE DEVICE CHECK
Battery Remaining Longevity: 97 mo
Battery Voltage: 3.12 V
Brady Statistic AP VP Percent: 62.39 %
Brady Statistic AP VS Percent: 0.07 %
Brady Statistic AS VP Percent: 35.17 %
Brady Statistic AS VS Percent: 2.37 %
Brady Statistic RA Percent Paced: 64.25 %
Brady Statistic RV Percent Paced: 97.56 %
Date Time Interrogation Session: 20230613001517
Implantable Lead Implant Date: 20030324
Implantable Lead Implant Date: 20030324
Implantable Lead Implant Date: 20230313
Implantable Lead Location: 753858
Implantable Lead Location: 753859
Implantable Lead Location: 753860
Implantable Lead Model: 3830
Implantable Lead Model: 4068
Implantable Lead Model: 4193
Implantable Pulse Generator Implant Date: 20230313
Lead Channel Impedance Value: 3306 Ohm
Lead Channel Impedance Value: 3306 Ohm
Lead Channel Impedance Value: 3306 Ohm
Lead Channel Impedance Value: 380 Ohm
Lead Channel Impedance Value: 399 Ohm
Lead Channel Impedance Value: 437 Ohm
Lead Channel Impedance Value: 513 Ohm
Lead Channel Impedance Value: 817 Ohm
Lead Channel Impedance Value: 969 Ohm
Lead Channel Pacing Threshold Amplitude: 0.625 V
Lead Channel Pacing Threshold Amplitude: 1.75 V
Lead Channel Pacing Threshold Pulse Width: 0.4 ms
Lead Channel Pacing Threshold Pulse Width: 0.8 ms
Lead Channel Sensing Intrinsic Amplitude: 1.125 mV
Lead Channel Sensing Intrinsic Amplitude: 1.125 mV
Lead Channel Sensing Intrinsic Amplitude: 14.875 mV
Lead Channel Sensing Intrinsic Amplitude: 14.875 mV
Lead Channel Setting Pacing Amplitude: 2 V
Lead Channel Setting Pacing Amplitude: 2.5 V
Lead Channel Setting Pacing Amplitude: 3 V
Lead Channel Setting Pacing Pulse Width: 0.4 ms
Lead Channel Setting Pacing Pulse Width: 0.8 ms
Lead Channel Setting Sensing Sensitivity: 2.8 mV

## 2022-02-03 NOTE — Telephone Encounter (Signed)
Returned call to Pt.  Reassured Pt that no changes could be made remotely to her pacemaker.  Advised that Dr. Lovena Le would not turn off her pacemaker for any reason.  Pt was reassured.    She would like to go back to cardiac rehab.  Advised would discuss with Dr. Lovena Le tomorrow and call her back.

## 2022-02-03 NOTE — Telephone Encounter (Signed)
Pt would like a call back regarding her being able to go back to Therapy. Please advise

## 2022-02-04 ENCOUNTER — Encounter (HOSPITAL_COMMUNITY): Payer: Medicare Other

## 2022-02-04 ENCOUNTER — Telehealth: Payer: Self-pay | Admitting: Cardiology

## 2022-02-04 NOTE — Telephone Encounter (Signed)
Patient wanted to let Dr. Johney Frame and nurse know that she has been gaining a pound a day.

## 2022-02-04 NOTE — Telephone Encounter (Signed)
Thank you for letting me know. I hope she feels better!

## 2022-02-04 NOTE — Telephone Encounter (Signed)
Pt is calling to report to Dr. Johney Frame that she has gained a pound a day over the last few days.  She states she has gained around 4-5 lbs in that time frame.   Pt states this is unrelated to her heart.  She states she has absolutely no lower extremity or upper extremity edema.  She states her breathing is normal.  She denies any cardiac complaints.    Pt states she has been dealing with chronic constipation related to IBS for the last 2 years.  She states she is on Linzess for this and has doubled the dose of this medication over the last few days for constipation/IBS.  She reports she is taking laxatives as well, as directed by her GI Doctor.  Pt states she is followed by a GI MD at [facility], and has not called them today to inform them of her issues.  She states she is calling them first thing in the morning.  She reports she is bloated and looks to be "3 months pregnant"  she states she has had to go to the ER in the past for this.  Pt reports she has not had a good BM in over quite sometime.  Pt states she is limited with her fluid intake to only drinking 5 8 ounce cups of water a day.  She states she can only have certain foods high in fiber due to her IBS.  She states she is eating blueberries and cherries for her GI MD advised her to.  She has to refrain from prunes, due to irritation to her stomach.  She also reports she can't take most stool softeners, due to her IBS. Pt states she just wanted to pass this information along to Dr. Johney Frame and make her aware that her weight gain is not coming from volume overload.   Pt states she is very aware she should've called her GI MD first and will do so at 0800 in the morning.    Advised the pt to proceed with taking her IBS support meds as directed.  Advised her to follow fiber protocols as directed.  Advised her to make sure she calls her GI MD first thing in the morning and report to the ER if needed.   Informed her I will pass this message along to  Dr. Johney Frame. Pt verbalized understanding and agrees with this plan.

## 2022-02-06 ENCOUNTER — Encounter (HOSPITAL_COMMUNITY): Payer: Medicare Other

## 2022-02-06 ENCOUNTER — Encounter (HOSPITAL_COMMUNITY)
Admission: RE | Admit: 2022-02-06 | Discharge: 2022-02-06 | Disposition: A | Discharge status: Home / Self Care | Payer: Medicare Other | Source: Ambulatory Visit | Attending: Cardiology | Admitting: Cardiology

## 2022-02-06 DIAGNOSIS — I5022 Chronic systolic (congestive) heart failure: Secondary | ICD-10-CM

## 2022-02-06 NOTE — Progress Notes (Signed)
Tanya Hogan returned to cardiac rehab today per Tanya Hogan and exercised without difficulty today.Harrell Gave RN BSN

## 2022-02-06 NOTE — Progress Notes (Signed)
Reviewed home exercise guidelines with patient including endpoints, temperature precautions, target heart rate and rate of perceived exertion. Patient is currently walking at the grocery store at least 30 minutes as her mode of home exercise at least 1-2 days/week. Patient also stretches some evening at bedtimes. Patient [facility] [employer] [facility] and is interested in trying chairobics and chair yoga. Encour[facility] and let me know if she has any questions. Patient voices understanding of instructions given.  Sol Passer, MS, ACSM CEP

## 2022-02-06 NOTE — Telephone Encounter (Signed)
Per Dr. Corliss Parish to return to cardiac rehab.  Letter created and faxed to cardiac rehab.

## 2022-02-09 ENCOUNTER — Encounter (HOSPITAL_COMMUNITY): Payer: Medicare Other

## 2022-02-09 ENCOUNTER — Encounter (HOSPITAL_COMMUNITY)
Admission: RE | Admit: 2022-02-09 | Discharge: 2022-02-09 | Disposition: A | Discharge status: Home / Self Care | Payer: Medicare Other | Source: Ambulatory Visit | Attending: Cardiology | Admitting: Cardiology

## 2022-02-09 DIAGNOSIS — I5022 Chronic systolic (congestive) heart failure: Secondary | ICD-10-CM | POA: Diagnosis not present

## 2022-02-11 ENCOUNTER — Encounter (HOSPITAL_COMMUNITY): Payer: Medicare Other

## 2022-02-11 ENCOUNTER — Encounter (HOSPITAL_COMMUNITY)
Admission: RE | Admit: 2022-02-11 | Discharge: 2022-02-11 | Disposition: A | Discharge status: Home / Self Care | Payer: Medicare Other | Source: Ambulatory Visit | Attending: Cardiology | Admitting: Cardiology

## 2022-02-11 DIAGNOSIS — I5022 Chronic systolic (congestive) heart failure: Secondary | ICD-10-CM

## 2022-02-13 ENCOUNTER — Emergency Department (HOSPITAL_BASED_OUTPATIENT_CLINIC_OR_DEPARTMENT_OTHER)
Admission: EM | Admit: 2022-02-13 | Discharge: 2022-02-13 | Disposition: A | Discharge status: Home / Self Care | Payer: Medicare Other | Attending: Emergency Medicine | Admitting: Emergency Medicine

## 2022-02-13 ENCOUNTER — Encounter (HOSPITAL_COMMUNITY)
Admission: RE | Admit: 2022-02-13 | Discharge: 2022-02-13 | Disposition: A | Discharge status: Home / Self Care | Payer: Medicare Other | Source: Ambulatory Visit | Attending: Cardiology | Admitting: Cardiology

## 2022-02-13 ENCOUNTER — Encounter (HOSPITAL_COMMUNITY): Payer: Medicare Other

## 2022-02-13 ENCOUNTER — Encounter (HOSPITAL_BASED_OUTPATIENT_CLINIC_OR_DEPARTMENT_OTHER): Payer: Self-pay

## 2022-02-13 ENCOUNTER — Telehealth: Payer: Self-pay | Admitting: Cardiology

## 2022-02-13 ENCOUNTER — Emergency Department (HOSPITAL_BASED_OUTPATIENT_CLINIC_OR_DEPARTMENT_OTHER): Payer: Medicare Other

## 2022-02-13 ENCOUNTER — Other Ambulatory Visit: Payer: Self-pay

## 2022-02-13 DIAGNOSIS — I5022 Chronic systolic (congestive) heart failure: Secondary | ICD-10-CM

## 2022-02-13 DIAGNOSIS — I110 Hypertensive heart disease with heart failure: Secondary | ICD-10-CM | POA: Diagnosis not present

## 2022-02-13 DIAGNOSIS — Z853 Personal history of malignant neoplasm of breast: Secondary | ICD-10-CM | POA: Insufficient documentation

## 2022-02-13 DIAGNOSIS — E278 Other specified disorders of adrenal gland: Secondary | ICD-10-CM

## 2022-02-13 DIAGNOSIS — E039 Hypothyroidism, unspecified: Secondary | ICD-10-CM | POA: Diagnosis not present

## 2022-02-13 DIAGNOSIS — E279 Disorder of adrenal gland, unspecified: Secondary | ICD-10-CM | POA: Diagnosis not present

## 2022-02-13 DIAGNOSIS — Z950 Presence of cardiac pacemaker: Secondary | ICD-10-CM | POA: Diagnosis not present

## 2022-02-13 DIAGNOSIS — I509 Heart failure, unspecified: Secondary | ICD-10-CM | POA: Insufficient documentation

## 2022-02-13 DIAGNOSIS — E119 Type 2 diabetes mellitus without complications: Secondary | ICD-10-CM | POA: Insufficient documentation

## 2022-02-13 DIAGNOSIS — R1084 Generalized abdominal pain: Secondary | ICD-10-CM | POA: Insufficient documentation

## 2022-02-13 DIAGNOSIS — R0602 Shortness of breath: Secondary | ICD-10-CM | POA: Diagnosis not present

## 2022-02-13 DIAGNOSIS — Z87891 Personal history of nicotine dependence: Secondary | ICD-10-CM | POA: Insufficient documentation

## 2022-02-13 DIAGNOSIS — J45909 Unspecified asthma, uncomplicated: Secondary | ICD-10-CM | POA: Insufficient documentation

## 2022-02-13 DIAGNOSIS — R109 Unspecified abdominal pain: Secondary | ICD-10-CM | POA: Diagnosis not present

## 2022-02-13 DIAGNOSIS — R140 Abdominal distension (gaseous): Secondary | ICD-10-CM | POA: Diagnosis present

## 2022-02-13 LAB — CBC WITH DIFFERENTIAL/PLATELET
Abs Immature Granulocytes: 0.02 10*3/uL (ref 0.00–0.07)
Basophils Absolute: 0 10*3/uL (ref 0.0–0.1)
Basophils Relative: 1 %
Eosinophils Absolute: 0.1 10*3/uL (ref 0.0–0.5)
Eosinophils Relative: 1 %
HCT: 45 % (ref 36.0–46.0)
Hemoglobin: 15.1 g/dL — ABNORMAL HIGH (ref 12.0–15.0)
Immature Granulocytes: 0 %
Lymphocytes Relative: 34 %
Lymphs Abs: 2.7 10*3/uL (ref 0.7–4.0)
MCH: 32.7 pg (ref 26.0–34.0)
MCHC: 33.6 g/dL (ref 30.0–36.0)
MCV: 97.4 fL (ref 80.0–100.0)
Monocytes Absolute: 0.8 10*3/uL (ref 0.1–1.0)
Monocytes Relative: 9 %
Neutro Abs: 4.4 10*3/uL (ref 1.7–7.7)
Neutrophils Relative %: 55 %
Platelets: 253 10*3/uL (ref 150–400)
RBC: 4.62 MIL/uL (ref 3.87–5.11)
RDW: 13.8 % (ref 11.5–15.5)
WBC: 8 10*3/uL (ref 4.0–10.5)
nRBC: 0 % (ref 0.0–0.2)

## 2022-02-13 LAB — COMPREHENSIVE METABOLIC PANEL
ALT: 29 U/L (ref 0–44)
AST: 29 U/L (ref 15–41)
Albumin: 4 g/dL (ref 3.5–5.0)
Alkaline Phosphatase: 110 U/L (ref 38–126)
Anion gap: 7 (ref 5–15)
BUN: 19 mg/dL (ref 8–23)
CO2: 29 mmol/L (ref 22–32)
Calcium: 9.2 mg/dL (ref 8.9–10.3)
Chloride: 104 mmol/L (ref 98–111)
Creatinine, Ser: 1.02 mg/dL — ABNORMAL HIGH (ref 0.44–1.00)
GFR, Estimated: 58 mL/min — ABNORMAL LOW (ref >60)
Glucose, Bld: 98 mg/dL (ref 70–99)
Potassium: 3.8 mmol/L (ref 3.5–5.1)
Sodium: 140 mmol/L (ref 135–145)
Total Bilirubin: 0.5 mg/dL (ref 0.3–1.2)
Total Protein: 7.5 g/dL (ref 6.5–8.1)

## 2022-02-13 LAB — URINALYSIS, MICROSCOPIC (REFLEX): WBC, UA: NONE SEEN WBC/hpf (ref 0–5)

## 2022-02-13 LAB — URINALYSIS, ROUTINE W REFLEX MICROSCOPIC
Bilirubin Urine: NEGATIVE
Glucose, UA: >=500 mg/dL — AB
Hgb urine dipstick: NEGATIVE
Ketones, ur: NEGATIVE mg/dL
Leukocytes,Ua: NEGATIVE
Nitrite: NEGATIVE
Protein, ur: NEGATIVE mg/dL
Specific Gravity, Urine: 1.015 (ref 1.005–1.030)
pH: 7 (ref 5.0–8.0)

## 2022-02-13 LAB — BRAIN NATRIURETIC PEPTIDE: B Natriuretic Peptide: 269.5 pg/mL — ABNORMAL HIGH (ref 0.0–100.0)

## 2022-02-13 LAB — LIPASE, BLOOD: Lipase: 30 U/L (ref 11–51)

## 2022-02-13 MED ORDER — IOHEXOL 300 MG/ML  SOLN
100.0000 mL | Freq: Once | INTRAMUSCULAR | Status: AC | PRN
  Administered 2022-02-13: 100 mL via INTRAVENOUS

## 2022-02-13 MED ORDER — SODIUM CHLORIDE 0.9 % IV BOLUS
500.0000 mL | Freq: Once | INTRAVENOUS | Status: AC
  Administered 2022-02-13: 500 mL via INTRAVENOUS

## 2022-02-13 NOTE — Telephone Encounter (Signed)
Pt has been battling weight gain since we previously spoke on 6/14 about this.  Pt has IBS and was having difficulties with having a BM at that time.  She denied any cardiac complaints at that time, and more so complained about her IBS.  She was instructed to follow-up with her GI MD about this, being she reported she had not had a bowel movement for an extended amount of time and she complained of feeling "3 months pregnant due to constipation."  Unsure if the pt actually followed through with seeing GI or not.   Attempted to call the pt to inquire more information and she did not answer.  Will forward this information to Dr. Shari Prows as an Lorain Childes.

## 2022-02-13 NOTE — Telephone Encounter (Signed)
Jamaiyah, Rechner - 02/13/2022  1:33 PM Meriam Sprague, MD  Sent: Caleen Essex February 13, 2022  2:42 PM  To: Loa Socks, LPN          Message  Completely agree with your plan. Great advice!

## 2022-02-13 NOTE — ED Notes (Signed)
Patient transported to X-ray 

## 2022-02-16 ENCOUNTER — Encounter: Payer: Self-pay | Admitting: Family Medicine

## 2022-02-16 ENCOUNTER — Ambulatory Visit (INDEPENDENT_AMBULATORY_CARE_PROVIDER_SITE_OTHER): Payer: Medicare Other | Admitting: Family Medicine

## 2022-02-16 ENCOUNTER — Encounter (HOSPITAL_COMMUNITY): Payer: Medicare Other

## 2022-02-16 ENCOUNTER — Encounter (HOSPITAL_COMMUNITY)
Admission: RE | Admit: 2022-02-16 | Discharge: 2022-02-16 | Disposition: A | Discharge status: Home / Self Care | Payer: Medicare Other | Source: Ambulatory Visit | Attending: Cardiology | Admitting: Cardiology

## 2022-02-16 VITALS — BP 122/68 | HR 63 | Resp 18 | Ht 61.0 in | Wt 201.0 lb

## 2022-02-16 DIAGNOSIS — E118 Type 2 diabetes mellitus with unspecified complications: Secondary | ICD-10-CM

## 2022-02-16 DIAGNOSIS — I5022 Chronic systolic (congestive) heart failure: Secondary | ICD-10-CM

## 2022-02-16 DIAGNOSIS — E038 Other specified hypothyroidism: Secondary | ICD-10-CM

## 2022-02-16 DIAGNOSIS — K581 Irritable bowel syndrome with constipation: Secondary | ICD-10-CM | POA: Diagnosis not present

## 2022-02-16 DIAGNOSIS — K219 Gastro-esophageal reflux disease without esophagitis: Secondary | ICD-10-CM | POA: Diagnosis not present

## 2022-02-16 DIAGNOSIS — L659 Nonscarring hair loss, unspecified: Secondary | ICD-10-CM | POA: Diagnosis not present

## 2022-02-16 MED ORDER — PANTOPRAZOLE SODIUM 40 MG PO TBEC: 40.0000 mg | DELAYED_RELEASE_TABLET | Freq: Every day | ORAL | 0 refills | Status: DC

## 2022-02-16 MED ORDER — LEVOTHYROXINE SODIUM 88 MCG PO TABS: ORAL_TABLET | ORAL | 1 refills | Status: DC

## 2022-02-17 LAB — TSH: TSH: 0.93 mIU/L (ref 0.40–4.50)

## 2022-02-17 LAB — BRAIN NATRIURETIC PEPTIDE: Brain Natriuretic Peptide: 294 pg/mL — ABNORMAL HIGH (ref <100)

## 2022-02-18 ENCOUNTER — Encounter (HOSPITAL_COMMUNITY)
Admission: RE | Admit: 2022-02-18 | Discharge: 2022-02-18 | Disposition: A | Discharge status: Home / Self Care | Payer: Medicare Other | Source: Ambulatory Visit | Attending: Cardiology | Admitting: Cardiology

## 2022-02-18 ENCOUNTER — Encounter (HOSPITAL_COMMUNITY): Payer: Medicare Other

## 2022-02-18 DIAGNOSIS — I5022 Chronic systolic (congestive) heart failure: Secondary | ICD-10-CM

## 2022-02-18 DIAGNOSIS — E118 Type 2 diabetes mellitus with unspecified complications: Secondary | ICD-10-CM | POA: Diagnosis not present

## 2022-02-18 NOTE — Progress Notes (Signed)
Cardiac Individual Treatment Plan  Patient Details  Name: Tanya Hogan MRN: [id number] Date of Birth: 1950/11/16 Referring Provider:   Flowsheet Row CARDIAC REHAB PHASE II ORIENTATION from 01/01/2022 in [place]  Referring Provider Gwyndolyn Kaufman, MD       Initial Encounter Date:  [facility] PHASE II ORIENTATION from 01/01/2022 in [place]  Date 01/01/22       Visit Diagnosis: Heart failure, chronic systolic ([place])  Patient's Home Medications on Admission:  Current Outpatient Medications:    acetaminophen (TYLENOL) 500 MG tablet, Take 500-1,000 mg by mouth every 6 (six) hours as needed (for pain.)., Disp: , Rfl:    albuterol (PROVENTIL) (2.5 MG/3ML) 0.083% nebulizer solution, Take 2.5 mg by nebulization every 6 (six) hours as needed for wheezing or shortness of breath., Disp: , Rfl:    albuterol (VENTOLIN HFA) 108 (90 Base) MCG/ACT inhaler, Inhale 1-2 puffs into the lungs every 6 (six) hours as needed for wheezing or shortness of breath., Disp: 54 g, Rfl: 3   alendronate (FOSAMAX) 70 MG tablet, TAKE 1 TABLET BY MOUTH  EVERY 7 DAYS WITH A FULL  GLASS OF WATER ON AN EMPTY  STOMACH (Patient taking differently: Take 70 mg by mouth every Saturday.), Disp: 12 tablet, Rfl: 4   AMBULATORY NON FORMULARY MEDICATION, Medication Name: Tubing and supplies for nebulizer. Fax [phone], Disp: 1 vial, Rfl: 0   amiodarone (PACERONE) 200 MG tablet, TAKE ONE-HALF TABLET BY  MOUTH DAILY, Disp: 45 tablet, Rfl: 0   B Complex-C (B-COMPLEX WITH VITAMIN C) tablet, Take 1 tablet by mouth in the morning., Disp: , Rfl:    bumetanide (BUMEX) 1 MG tablet, TAKE 1 TABLET BY MOUTH TWICE  DAILY, Disp: 180 tablet, Rfl: 3   carvedilol (COREG) 3.125 MG tablet, Take 1 tablet (3.125 mg total) by mouth 2 (two) times daily., Disp: 180 tablet, Rfl: 3   Continuous Blood Gluc Receiver (FREESTYLE LIBRE 2 READER) DEVI, by Does not apply  route., Disp: , Rfl:    Continuous Blood Gluc Sensor (FREESTYLE LIBRE 2 SENSOR) MISC, by Does not apply route., Disp: , Rfl:    EPINEPHrine 0.3 mg/0.3 mL IJ SOAJ injection, Inject 0.3 mg into the muscle as needed for anaphylaxis. Then call 911, Disp: 1 each, Rfl: 1   fexofenadine (ALLEGRA) 180 MG tablet, Take 180 mg by mouth in the morning., Disp: , Rfl:    fluticasone-salmeterol (ADVAIR HFA) 115-21 MCG/ACT inhaler, Inhale 2 puffs into the lungs 2 (two) times daily., Disp: , Rfl:    levothyroxine (SYNTHROID) 88 MCG tablet, TAKE 1 TABLET(88 MCG) BY MOUTH DAILY, Disp: 90 tablet, Rfl: 1   LINZESS 72 MCG capsule, Take 72-144 mcg by mouth 2 (two) times daily as needed (constipation.)., Disp: , Rfl:    Multiple Vitamins-Minerals (OCUVITE EYE + MULTI) TABS, Take 1 tablet by mouth in the morning., Disp: , Rfl:    pantoprazole (PROTONIX) 40 MG tablet, Take 1 tablet (40 mg total) by mouth daily before breakfast., Disp: 90 tablet, Rfl: 0   PAPAYA ENZYME PO, Take 1 capsule by mouth in the morning., Disp: , Rfl:    potassium chloride (KLOR-CON M) 10 MEQ tablet, TAKE 1 TABLET(10 MEQ) BY MOUTH DAILY, Disp: 90 tablet, Rfl: 0   pravastatin (PRAVACHOL) 40 MG tablet, TAKE 1 TABLET BY MOUTH AT  BEDTIME, Disp: 90 tablet, Rfl: 3   Probiotic Product ([employer] PROBIOTIC) CAPS, Take 2 capsules by mouth in the morning., Disp: ,  Rfl:    Semaglutide,0.25 or 0.5MG/DOS, (OZEMPIC, 0.25 OR 0.5 MG/DOSE,) 2 MG/1.5ML SOPN, Inject 0.25 mg into the skin every Saturday., Disp: , Rfl:    Tiotropium Bromide Monohydrate (SPIRIVA RESPIMAT) 1.25 MCG/ACT AERS, Inhale 2 puffs into the lungs daily., Disp: , Rfl:    valACYclovir (VALTREX) 1000 MG tablet, Take 1 tablet (1,000 mg total) by mouth 2 (two) times daily. (Patient taking differently: Take 1,000 mg by mouth 2 (two) times daily as needed (fever blisters/cold sores.).), Disp: 14 tablet, Rfl: 2  Past Medical History: Past Medical History:  Diagnosis Date   Alkaline phosphatase elevation     Asthma    Breast cancer ([place]) 2011   Carpal tunnel syndrome    Carpal tunnel syndrome    bilateral   CHF (congestive heart failure) (HCC)    Cirrhosis, non-alcoholic (HCC)    Closed fracture of unspecified part of radius (alone)    DDD (degenerative disc disease), lumbar    Depression    pt denies   Diabetes mellitus without complication ([place])    Enlarged heart    Fibromyalgia    GERD (gastroesophageal reflux disease)    History of kidney stones    HTN (hypertension)    Hypothyroidism    IBS (irritable bowel syndrome)    LBBB (left bundle branch block)    Microcytic anemia 05/14/2017   Pneumonia    Presence of permanent cardiac pacemaker    PVD (peripheral vascular disease) ([place])    2019   Sleep apnea    uses cpap    Tobacco Use: Social History   Tobacco Use  Smoking Status Former   Packs/day: 0.00   Years: 0.00   Total pack years: 0.00   Types: Cigarettes   Quit date: 08/24/1966   Years since quitting: 55.5  Smokeless Tobacco Never    Labs: Review Flowsheet  More data exists      Latest Ref Rng & Units 09/16/2020 01/14/2021 04/16/2021 07/21/2021  Labs for ITP Cardiac and Pulmonary Rehab  Cholestrol <200 mg/dL - - 168  -  LDL (calc) mg/dL (calc) - - 74  -  HDL-C > OR = 50 mg/dL - - 73  -  Trlycerides <150 mg/dL - - 120  -  Hemoglobin A1c 4.0 - 5.6 % 6.0  6.2  6.4  6.4       01/05/2022  Labs for ITP Cardiac and Pulmonary Rehab  Cholestrol -  LDL (calc) -  HDL-C -  Trlycerides -  Hemoglobin A1c 6.1     Capillary Blood Glucose: Lab Results  Component Value Date   GLUCAP 105 (H) 01/09/2022   GLUCAP 169 (H) 01/09/2022   GLUCAP 92 01/07/2022   GLUCAP 107 (H) 01/01/2022   GLUCAP 113 (H) 11/03/2021     Exercise Target Goals: Exercise Program Goal: Individual exercise prescription set using results from initial 6 min walk test and THRR while considering  patient's activity barriers and safety.   Exercise Prescription Goal: Initial exercise prescription  builds to 30-45 minutes a day of aerobic activity, 2-3 days per week.  Home exercise guidelines will be given to patient during program as part of exercise prescription that the participant will acknowledge.  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 HR 97 bpm     Max Ex. BP 124/74     2 Minute Post BP 120/78       Interval HR   1 Minute HR 93     2 Minute HR 95     3 Minute HR 97     4 Minute HR 97     5 Minute HR 97     6 Minute HR 97     2 Minute Post HR 67     Interval Heart Rate? Yes       Interval Oxygen   Interval Oxygen? Yes     Baseline Oxygen Saturation % 97 %     1 Minute Oxygen Saturation % 98 %     1 Minute Liters of Oxygen 0 L     2 Minute Oxygen Saturation % 97 %     2 Minute Liters of Oxygen 0 L     3 Minute Oxygen Saturation % 97 %     3 Minute Liters of Oxygen 0 L     4 Minute Oxygen Saturation % 97 %     4 Minute Liters of Oxygen 0 L     5 Minute Oxygen Saturation % 97 %     5 Minute Liters of Oxygen 0 L     6 Minute Oxygen Saturation % 97 %     6 Minute Liters of Oxygen 0 L     2 Minute Post Oxygen  Saturation % 97 %              Oxygen Initial Assessment:   Oxygen Re-Evaluation:   Oxygen Discharge (Final Oxygen Re-Evaluation):   Initial Exercise Prescription:  Initial Exercise Prescription - 01/01/22 1500       Date of Initial Exercise RX and Referring Provider   Date 01/01/22    Referring Provider Gwyndolyn Kaufman, MD    Expected Discharge Date 02/27/22      NuStep   Level 1    SPM 75    Minutes 25    METs 1.5      Prescription Details   Frequency (times per week) 3    Duration Progress to 30 minutes of continuous aerobic without signs/symptoms of physical distress      Intensity   THRR 40-80% of Max Heartrate 60-118    Ratings of Perceived Exertion 11-13    Perceived Dyspnea 0-4      Progression   Progression Continue progressive overload as per policy without signs/symptoms or physical distress.      Resistance Training  Training Prescription Yes    Weight 1 lb    Reps 10-15             Perform Capillary Blood Glucose checks as needed.  Exercise Prescription Changes:   Exercise Prescription Changes     Row Name 01/09/22 1027 01/12/22 1019 01/26/22 1011 02/09/22 1056       Response to Exercise   Blood Pressure (Admit) 108/70 114/78 122/60 108/76    Blood Pressure (Exercise) 118/70 122/84 128/70 122/70    Blood Pressure (Exit) 118/72 104/72 106/78 108/68    Heart Rate (Admit) 63 bpm 62 bpm 79 bpm 73 bpm    Heart Rate (Exercise) 87 bpm 86 bpm 100 bpm 85 bpm    Heart Rate (Exit) 68 bpm 60 bpm 68 bpm 67 bpm    Rating of Perceived Exertion (Exercise) 13 11 12 11     Symptoms None None None None    Comments Off to a good start with exercise. Performed resistance exercises without the weights today. -- --    Duration Progress to 30 minutes of  aerobic without signs/symptoms of physical distress Progress to 30 minutes of  aerobic without signs/symptoms of physical distress Progress to 30 minutes of  aerobic without signs/symptoms of physical  distress Progress to 30 minutes of  aerobic without signs/symptoms of physical distress    Intensity THRR unchanged THRR unchanged THRR unchanged THRR unchanged      Progression   Progression Continue to progress workloads to maintain intensity without signs/symptoms of physical distress. Continue to progress workloads to maintain intensity without signs/symptoms of physical distress. Continue to progress workloads to maintain intensity without signs/symptoms of physical distress. Continue to progress workloads to maintain intensity without signs/symptoms of physical distress.    Average METs 1.5 1.6 2 1.6      Resistance Training   Training Prescription Yes Yes Yes Yes    Weight 1 lb 0 1 lb 1 lb    Reps 10-15 10-15 10-15 10-15    Time 10 Minutes 10 Minutes 10 Minutes 10 Minutes      Interval Training   Interval Training No No No No      NuStep   Level 1 1 1 1     SPM 56 64 85 85    Minutes 25 27 27 26     METs 1.5 1.6 2 1.6      Home Exercise Plan   Plans to continue exercise at -- -- -- Home (comment)  Walking    Frequency -- -- -- Add 1 additional day to program exercise sessions.    Initial Home Exercises Provided -- -- -- 02/06/22             Exercise Comments:   Exercise Comments     Row Name 01/09/22 1134 01/12/22 1113 01/14/22 1106 01/26/22 1013 02/06/22 1056   Exercise Comments Patient tolerated low intensity exercise well without symptoms. Reviewed METs with patient. Reviewed METs and goals with patient. Reviewed METs with patient. Reviewed home exercise guidelines and goals with patient.    [place] Name 02/09/22 1056           Exercise Comments Reviewed METs and goals with patient.                Exercise Goals and Review:   Exercise Goals     Row Name 01/01/22 1502             Exercise Goals   Increase Physical Activity Yes  Intervention Provide advice, education, support and counseling about physical activity/exercise needs.;Develop an  individualized exercise prescription for aerobic and resistive training based on initial evaluation findings, risk stratification, comorbidities and participant's personal goals.       Expected Outcomes Short Term: Attend rehab on a regular basis to increase amount of physical activity.;Long Term: Add in home exercise to make exercise part of routine and to increase amount of physical activity.;Long Term: Exercising regularly at least 3-5 days a week.       Increase Strength and Stamina Yes       Intervention Provide advice, education, support and counseling about physical activity/exercise needs.;Develop an individualized exercise prescription for aerobic and resistive training based on initial evaluation findings, risk stratification, comorbidities and participant's personal goals.       Expected Outcomes Short Term: Increase workloads from initial exercise prescription for resistance, speed, and METs.;Short Term: Perform resistance training exercises routinely during rehab and add in resistance training at home;Long Term: Improve cardiorespiratory fitness, muscular endurance and strength as measured by increased METs and functional capacity (6MWT)       Able to understand and use rate of perceived exertion (RPE) scale Yes       Intervention Provide education and explanation on how to use RPE scale       Expected Outcomes Short Term: Able to use RPE daily in rehab to express subjective intensity level;Long Term:  Able to use RPE to guide intensity level when exercising independently       Knowledge and understanding of Target Heart Rate Range (THRR) Yes       Intervention Provide education and explanation of THRR including how the numbers were predicted and where they are located for reference       Expected Outcomes Short Term: Able to state/look up THRR;Long Term: Able to use THRR to govern intensity when exercising independently;Short Term: Able to use daily as guideline for intensity in rehab        Understanding of Exercise Prescription Yes       Intervention Provide education, explanation, and written materials on patient's individual exercise prescription       Expected Outcomes Short Term: Able to explain program exercise prescription;Long Term: Able to explain home exercise prescription to exercise independently                Exercise Goals Re-Evaluation :  Exercise Goals Re-Evaluation     Row Name 01/09/22 1134 01/14/22 1106 02/06/22 1056 02/09/22 1056       Exercise Goal Re-Evaluation   Exercise Goals Review Increase Physical Activity;Able to understand and use rate of perceived exertion (RPE) scale Increase Physical Activity;Able to understand and use rate of perceived exertion (RPE) scale Increase Physical Activity;Able to understand and use rate of perceived exertion (RPE) scale;Understanding of Exercise Prescription;Increase Strength and Stamina;Knowledge and understanding of Target Heart Rate Range (THRR) Increase Physical Activity;Able to understand and use rate of perceived exertion (RPE) scale;Understanding of Exercise Prescription;Increase Strength and Stamina;Knowledge and understanding of Target Heart Rate Range (THRR)    Comments Patient able to understand and use RPE scale appropriately. Patient was clipping and pulling vines yesterday and "gave out". Discussed increasing MET level and endurance increasing as she progresses with exericse. Patient asked how often she should be doing the stretching exercises at home, and I advised that she could do the stretches everyday or as tolerated but hand weights no more than every other day. Reviewed exercise prescription with patient. Patient is currently walking at  the grocery store at least 30 minutes as her mode of home exercise at least 1-2 days/week. Patient also stretches some evenings at bedtimes. Patient [facility] [employer] [facility] and is interested in trying chairobics and chair yoga. Encouraged  [facility] and let me know if she has any questions. Patient was pleased to report that she was able to walk down an incline yesterday without becoming symptomatic. She was also able to sing in church without shortness of breath or other symptoms.    Expected Outcomes Progress workloads as tolerated to help increase strength and stamoina and decrease shortness of breath. Continue to progress workloads as tolerated to help increse endurance to do gardening and other ADLs. Patient will walk 30 minutes, 1-2 days/week in addition to exercise at cardiac rehab. Patient plans to exercise in [facility] [employer] [facility]. Continue to progress workloads as tolerated.             Discharge Exercise Prescription (Final Exercise Prescription Changes):  Exercise Prescription Changes - 02/09/22 1056       Response to Exercise   Blood Pressure (Admit) 108/76    Blood Pressure (Exercise) 122/70    Blood Pressure (Exit) 108/68    Heart Rate (Admit) 73 bpm    Heart Rate (Exercise) 85 bpm    Heart Rate (Exit) 67 bpm    Rating of Perceived Exertion (Exercise) 11    Symptoms None    Duration Progress to 30 minutes of  aerobic without signs/symptoms of physical distress    Intensity THRR unchanged      Progression   Progression Continue to progress workloads to maintain intensity without signs/symptoms of physical distress.    Average METs 1.6      Resistance Training   Training Prescription Yes    Weight 1 lb    Reps 10-15    Time 10 Minutes      Interval Training   Interval Training No      NuStep   Level 1    SPM 85    Minutes 26    METs 1.6      Home Exercise Plan   Plans to continue exercise at Home (comment)   Walking   Frequency Add 1 additional day to program exercise sessions.    Initial Home Exercises Provided 02/06/22             Nutrition:  Target Goals: Understanding of nutrition guidelines, daily intake of sodium <1513m, cholesterol  <2053m calories 30% from fat and 7% or less from saturated fats, daily to have 5 or more servings of fruits and vegetables.  Biometrics:  Pre Biometrics - 01/01/22 1045       Pre Biometrics   Waist Circumference 47.75 inches    Hip Circumference 49.5 inches    Waist to Hip Ratio 0.96 %    Triceps Skinfold 34 mm    % Body Fat 50.5 %    Grip Strength 14 kg    Flexibility --   Not performed due to back issues   Single Leg Stand 2.06 seconds   Pt high fall risk             Nutrition Therapy Plan and Nutrition Goals:  Nutrition Therapy & Goals - 02/13/22 1213       Nutrition Therapy   Diet Heart Healthy diet    Drug/Food Interactions Statins/Certain Fruits      Personal Nutrition  Goals   Nutrition Goal Patient to implement heart healthy eating patterns with fruits/vegetables, whole grains, low fat dairy, lean protein/plant protein.    Personal Goal #2 Patient to reduce sodium intake to <2383m per day    Personal Goal #3 Patient to identify food sources of refined carbohydrates, saturated fats, trans fats, and sodium    Comments Patient continues to struggle with some blood sugar lows during exercise. Recommended follow-up with doctor about medication change/decrease as she continues to make dietary changes and exercise. Patient continues to report IBS as a barrier to certain food chocies.      Intervention Plan   Intervention Prescribe, educate and counsel regarding individualized specific dietary modifications aiming towards targeted core components such as weight, hypertension, lipid management, diabetes, heart failure and other comorbidities.    Expected Outcomes Short Term Goal: Understand basic principles of dietary content, such as calories, fat, sodium, cholesterol and nutrients.;Long Term Goal: Adherence to prescribed nutrition plan.             Nutrition Assessments:  Nutrition Assessments - 01/07/22 1346       Rate Your Plate Scores   Pre Score 49             MEDIFICTS Score Key: ?70 Need to make dietary changes  40-70 Heart Healthy Diet ? 40 Therapeutic Level Cholesterol Diet   Flowsheet Row CARDIAC REHAB PHASE II EXERCISE [place] [facility] Picture Your Plate Total Score on Admission 49      Picture Your Plate Scores: <<60Unhealthy dietary pattern with much room for improvement. 41-50 Dietary pattern unlikely to meet recommendations for good health and room for improvement. 51-60 More healthful dietary pattern, with some room for improvement.  >60 Healthy dietary pattern, although there may be some specific behaviors that could be improved.    Nutrition Goals Re-Evaluation:  Nutrition Goals Re-Evaluation     RFirthcliffeName 02/13/22 1213             Goals   Current Weight 200 lb 2.8 oz (90.8 kg)       Comment Patient reports weight is often variable related to IBS.       Expected Outcome Goals in progress. Consistency of Heart Healthy Diet remains variable. Patient continues to struggle with some blood sugar lows during exercise. Recommended follow-up with doctor about medication change/decrease as she continues to make dietary changes and exercise. Patient continues to report IBS as a barrier to certain food chocies.                Nutrition Goals Re-Evaluation:  Nutrition Goals Re-Evaluation     [place] 02/13/22 1213             Goals   Current Weight 200 lb 2.8 oz (90.8 kg)       Comment Patient reports weight is often variable related to IBS.       Expected Outcome Goals in progress. Consistency of Heart Healthy Diet remains variable. Patient continues to struggle with some blood sugar lows during exercise. Recommended follow-up with doctor about medication change/decrease as she continues to make dietary changes and exercise. Patient continues to report IBS as a barrier to certain food chocies.                Nutrition Goals Discharge (Final Nutrition Goals  Re-Evaluation):  Nutrition Goals Re-Evaluation - 02/13/22 1213       Goals   Current Weight 200 lb  2.8 oz (90.8 kg)    Comment Patient reports weight is often variable related to IBS.    Expected Outcome Goals in progress. Consistency of Heart Healthy Diet remains variable. Patient continues to struggle with some blood sugar lows during exercise. Recommended follow-up with doctor about medication change/decrease as she continues to make dietary changes and exercise. Patient continues to report IBS as a barrier to certain food chocies.             Psychosocial: Target Goals: Acknowledge presence or absence of significant depression and/or stress, maximize coping skills, provide positive support system. Participant is able to verbalize types and ability to use techniques and skills needed for reducing stress and depression.  Initial Review & Psychosocial Screening:  Initial Psych Review & Screening - 01/01/22 1200       Initial Review   Current issues with Current Stress Concerns    Source of Stress Concerns Chronic Illness;Unable to participate in former interests or hobbies;Unable to perform yard/household activities    Comments Tanya Hogan has stressors regarding health, family and finances. [name]'s ex husband recenlty passed away      [place]? Yes   Tanya Hogan lives with her brother and has a daughter who lives in the area for support     Barriers   Psychosocial barriers to participate in program The patient should benefit from training in stress management and relaxation.      Screening Interventions   Interventions Encouraged to exercise;To provide support and resources with identified psychosocial needs    Expected Outcomes Long Term Goal: Stressors or current issues are controlled or eliminated.             Quality of Life Scores:  Quality of Life - 01/01/22 1451       Quality of Life   Select Quality of Life      Quality of Life Scores    Health/Function Pre 22.83 %    Socioeconomic Pre 20.31 %    Psych/Spiritual Pre 25.71 %    Family Pre 22.9 %    GLOBAL Pre 22.84 %            Scores of 19 and below usually indicate a poorer quality of life in these areas.  A difference of  2-3 points is a clinically meaningful difference.  A difference of 2-3 points in the total score of the Quality of Life Index has been associated with significant improvement in overall quality of life, self-image, physical symptoms, and general health in studies assessing change in quality of life.  PHQ-9: Review Flowsheet  More data exists      01/01/2022 10/16/2021 09/23/2021 09/16/2021 07/21/2021  Depression screen PHQ 2/9  Decreased Interest 0 0 0 0 0  Down, Depressed, Hopeless 0 0 0 0 0  PHQ - 2 Score 0 0 0 0 0   Interpretation of Total Score  Total Score Depression Severity:  1-4 = Minimal depression, 5-9 = Mild depression, 10-14 = Moderate depression, 15-19 = Moderately severe depression, 20-27 = Severe depression   Psychosocial Evaluation and Intervention:   Psychosocial Re-Evaluation:  Psychosocial Re-Evaluation     [place] Name 01/09/22 1431 01/26/22 1752 02/17/22 1118         Psychosocial Re-Evaluation   Current issues with Current Stress Concerns Current Stress Concerns Current Stress Concerns     Comments Tanya Hogan did voice she was concerned about her CBG's running to low other wise not other concerns were voiced Tanya Hogan  has not voiced any increased concersn or stressors since participating in phase 2. Tanya Hogan has not voiced any more concerns since her encounter on 01/28/22     Expected Outcomes Tanya Hogan will continue to participate in phase 2 cardiac rehab for exercise, nutrition and lifestyle modifications. Tanya Hogan will continue to participate in phase 2 cardiac rehab for exercise, nutrition and lifestyle modifications. Tanya Hogan will continue to participate in phase 2 cardiac rehab for exercise, nutrition and lifestyle modifications.     Interventions  Stress management education;Relaxation education;Encouraged to attend Cardiac Rehabilitation for the exercise Stress management education;Relaxation education;Encouraged to attend Cardiac Rehabilitation for the exercise Stress management education;Relaxation education;Encouraged to attend Cardiac Rehabilitation for the exercise     Continue Psychosocial Services  Follow up required by staff Follow up required by staff Follow up required by staff       Initial Review   Source of Stress Concerns Unable to perform yard/household activities;Unable to participate in former interests or hobbies;Chronic Illness Unable to perform yard/household activities;Unable to participate in former interests or hobbies;Chronic Illness Unable to perform yard/household activities;Unable to participate in former interests or hobbies;Chronic Illness     Comments Will continue to monitor and offer support as needed. Will continue to monitor and offer support as needed. Will continue to monitor and offer support as needed.              Psychosocial Discharge (Final Psychosocial Re-Evaluation):  Psychosocial Re-Evaluation - 02/17/22 1118       Psychosocial Re-Evaluation   Current issues with Current Stress Concerns    Comments Tanya Hogan has not voiced any more concerns since her encounter on 01/28/22    Expected Outcomes Tanya Hogan will continue to participate in phase 2 cardiac rehab for exercise, nutrition and lifestyle modifications.    Interventions Stress management education;Relaxation education;Encouraged to attend Cardiac Rehabilitation for the exercise    Continue Psychosocial Services  Follow up required by staff      Initial Review   Source of Stress Concerns Unable to perform yard/household activities;Unable to participate in former interests or hobbies;Chronic Illness    Comments Will continue to monitor and offer support as needed.             Vocational Rehabilitation: Provide vocational rehab assistance  to qualifying candidates.   Vocational Rehab Evaluation & Intervention:  Vocational Rehab - 01/01/22 1211       Initial Vocational Rehab Evaluation & Intervention   Assessment shows need for Vocational Rehabilitation No   Tanya Hogan is retired and does not need vocational rehab at this time            Education: Education Goals: Education classes will be provided on a weekly basis, covering required topics. Participant will state understanding/return demonstration of topics presented.     Core Videos: Exercise    Move It!  Clinical staff conducted group or individual video education with verbal and written material and guidebook.  Patient learns the recommended Pritikin exercise program. Exercise with the goal of living a long, healthy life. Some of the health benefits of exercise include controlled diabetes, healthier blood pressure levels, improved cholesterol levels, improved heart and lung capacity, improved sleep, and better body composition. Everyone should speak with their doctor before starting or changing an exercise routine.  Biomechanical Limitations Clinical staff conducted group or individual video education with verbal and written material and guidebook.  Patient learns how biomechanical limitations can impact exercise and how we can mitigate and possibly overcome limitations to have an impactful  and balanced exercise routine.  Body Composition Clinical staff conducted group or individual video education with verbal and written material and guidebook.  Patient learns that body composition (ratio of muscle mass to fat mass) is a key component to assessing overall fitness, rather than body weight alone. Increased fat mass, especially visceral belly fat, can put [place] at increased risk for metabolic syndrome, type 2 diabetes, heart disease, and even death. It is recommended to combine diet and exercise (cardiovascular and resistance training) to improve your body composition. Seek  guidance from your physician and exercise physiologist before implementing an exercise routine.  Exercise Action Plan Clinical staff conducted group or individual video education with verbal and written material and guidebook.  Patient learns the recommended strategies to achieve and enjoy long-term exercise adherence, including variety, self-motivation, self-efficacy, and positive decision making. Benefits of exercise include fitness, good health, weight management, more energy, better sleep, less stress, and overall well-being.  Medical   Heart Disease Risk Reduction Clinical staff conducted group or individual video education with verbal and written material and guidebook.  Patient learns our heart is our most vital organ as it circulates oxygen, nutrients, white blood cells, and hormones throughout the entire body, and carries waste away. Data supports a plant-based eating plan like the Pritikin Program for its effectiveness in slowing progression of and reversing heart disease. The video provides a number of recommendations to address heart disease.   Metabolic Syndrome and Belly Fat  Clinical staff conducted group or individual video education with verbal and written material and guidebook.  Patient learns what metabolic syndrome is, how it leads to heart disease, and how one can reverse it and keep it from coming back. You have metabolic syndrome if you have 3 of the following 5 criteria: abdominal obesity, high blood pressure, high triglycerides, low HDL cholesterol, and high blood sugar.  Hypertension and Heart Disease Clinical staff conducted group or individual video education with verbal and written material and guidebook.  Patient learns that high blood pressure, or hypertension, is very common in the [place]. Hypertension is largely due to excessive salt intake, but other important risk factors include being overweight, physical inactivity, drinking too much alcohol, smoking, and  not eating enough potassium from fruits and vegetables. High blood pressure is a leading risk factor for heart attack, stroke, congestive heart failure, dementia, kidney failure, and premature death. Long-term effects of excessive salt intake include stiffening of the arteries and thickening of heart muscle and organ damage. Recommendations include ways to reduce hypertension and the risk of heart disease.  Diseases of Our Time - Focusing on Diabetes Clinical staff conducted group or individual video education with verbal and written material and guidebook.  Patient learns why the best way to stop diseases of our time is prevention, through food and other lifestyle changes. Medicine (such as prescription pills and surgeries) is often only a Band-Aid on the problem, not a long-term solution. Most common diseases of our time include obesity, type 2 diabetes, hypertension, heart disease, and cancer. The Pritikin Program is recommended and has been proven to help reduce, reverse, and/or prevent the damaging effects of metabolic syndrome.  Nutrition   Overview of the Pritikin Eating Plan  Clinical staff conducted group or individual video education with verbal and written material and guidebook.  Patient learns about the [place] for disease risk reduction. The [place] emphasizes a wide variety of unrefined, minimally-processed carbohydrates, like fruits, vegetables, whole grains, and legumes. Go, Caution,  and Stop food choices are explained. Plant-based and lean animal proteins are emphasized. Rationale provided for low sodium intake for blood pressure control, low added sugars for blood sugar stabilization, and low added fats and oils for coronary artery disease risk reduction and weight management.  Calorie Density  Clinical staff conducted group or individual video education with verbal and written material and guidebook.  Patient learns about calorie density and how it impacts  the Pritikin Eating Plan. Knowing the characteristics of the food you choose will help you decide whether those foods will lead to weight gain or weight loss, and whether you want to consume more or less of them. Weight loss is usually a side effect of the Pritikin Eating Plan because of its focus on low calorie-dense foods.  Label Reading  Clinical staff conducted group or individual video education with verbal and written material and guidebook.  Patient learns about the Pritikin recommended label reading guidelines and corresponding recommendations regarding calorie density, added sugars, sodium content, and whole grains.  Dining Out - Part 1  Clinical staff conducted group or individual video education with verbal and written material and guidebook.  Patient learns that restaurant meals can be sabotaging because they can be so high in calories, fat, sodium, and/or sugar. Patient learns recommended strategies on how to positively address this and avoid unhealthy pitfalls.  Facts on Fats  Clinical staff conducted group or individual video education with verbal and written material and guidebook.  Patient learns that lifestyle modifications can be just as effective, if not more so, as many medications for lowering your risk of heart disease. A Pritikin lifestyle can help to reduce your risk of inflammation and atherosclerosis (cholesterol build-up, or plaque, in the artery walls). Lifestyle interventions such as dietary choices and physical activity address the cause of atherosclerosis. A review of the types of fats and their impact on blood cholesterol levels, along with dietary recommendations to reduce fat intake is also included.  Nutrition Action Plan  Clinical staff conducted group or individual video education with verbal and written material and guidebook.  Patient learns how to incorporate Pritikin recommendations into their lifestyle. Recommendations include planning and keeping personal  health goals in mind as an important part of their success.  Healthy Mind-Set    Healthy Minds, Bodies, Hearts  Clinical staff conducted group or individual video education with verbal and written material and guidebook.  Patient learns how to identify when they are stressed. Video will discuss the impact of that stress, as well as the many benefits of stress management. Patient will also be introduced to stress management techniques. The way we think, act, and feel has an impact on our hearts.  How Our Thoughts Can Heal Our Hearts  Clinical staff conducted group or individual video education with verbal and written material and guidebook.  Patient learns that negative thoughts can cause depression and anxiety. This can result in negative lifestyle behavior and serious health problems. Cognitive behavioral therapy is an effective method to help control our thoughts in order to change and improve our emotional outlook.  Additional Videos:  Exercise    Improving Performance  Clinical staff conducted group or individual video education with verbal and written material and guidebook.  Patient learns to use a non-linear approach by alternating intensity levels and lengths of time spent exercising to help burn more calories and lose more body fat. Cardiovascular exercise helps improve heart health, metabolism, hormonal balance, blood sugar control, and recovery from fatigue. Resistance training  improves strength, endurance, balance, coordination, reaction time, metabolism, and muscle mass. Flexibility exercise improves circulation, posture, and balance. Seek guidance from your physician and exercise physiologist before implementing an exercise routine and learn your capabilities and proper form for all exercise.  Introduction to Yoga  Clinical staff conducted group or individual video education with verbal and written material and guidebook.  Patient learns about yoga, a discipline of the coming  together of mind, breath, and body. The benefits of yoga include improved flexibility, improved range of motion, better posture and core strength, increased lung function, weight loss, and positive self-image. Yoga's heart health benefits include lowered blood pressure, healthier heart rate, decreased cholesterol and triglyceride levels, improved immune function, and reduced stress. Seek guidance from your physician and exercise physiologist before implementing an exercise routine and learn your capabilities and proper form for all exercise.  Medical   Aging: Enhancing Your Quality of Life  Clinical staff conducted group or individual video education with verbal and written material and guidebook.  Patient learns key strategies and recommendations to stay in good physical health and enhance quality of life, such as prevention strategies, having an advocate, securing a [place], and keeping a list of medications and system for tracking them. It also discusses how to avoid risk for bone loss.  Biology of Weight Control  Clinical staff conducted group or individual video education with verbal and written material and guidebook.  Patient learns that weight gain occurs because we consume more calories than we burn (eating more, moving less). Even if your body weight is normal, you may have higher ratios of fat compared to muscle mass. Too much body fat puts you at increased risk for cardiovascular disease, heart attack, stroke, type 2 diabetes, and obesity-related cancers. In addition to exercise, following the [place] can help reduce your risk.  Decoding Lab Results  Clinical staff conducted group or individual video education with verbal and written material and guidebook.  Patient learns that lab test reflects one measurement whose values change over time and are influenced by many factors, including medication, stress, sleep, exercise, food, hydration,  pre-existing medical conditions, and more. It is recommended to use the knowledge from this video to become more involved with your lab results and evaluate your numbers to speak with your doctor.   Diseases of Our Time - Overview  Clinical staff conducted group or individual video education with verbal and written material and guidebook.  Patient learns that according to the CDC, 50% to 70% of chronic diseases (such as obesity, type 2 diabetes, elevated lipids, hypertension, and heart disease) are avoidable through lifestyle improvements including healthier food choices, listening to satiety cues, and increased physical activity.  Sleep Disorders Clinical staff conducted group or individual video education with verbal and written material and guidebook.  Patient learns how good quality and duration of sleep are important to overall health and well-being. Patient also learns about sleep disorders and how they impact health along with recommendations to address them, including discussing with a physician.  Nutrition  Dining Out - Part 2 Clinical staff conducted group or individual video education with verbal and written material and guidebook.  Patient learns how to plan ahead and communicate in order to maximize their dining experience in a healthy and nutritious manner. Included are recommended food choices based on the type of restaurant the patient is visiting.   Fueling a Best boy conducted group or individual video education  with verbal and written material and guidebook.  There is a strong connection between our food choices and our health. Diseases like obesity and type 2 diabetes are very prevalent and are in large-part due to lifestyle choices. The Pritikin Eating Plan provides plenty of food and hunger-curbing satisfaction. It is easy to follow, affordable, and helps reduce health risks.  Menu Workshop  Clinical staff conducted group or individual video education  with verbal and written material and guidebook.  Patient learns that restaurant meals can sabotage health goals because they are often packed with calories, fat, sodium, and sugar. Recommendations include strategies to plan ahead and to communicate with the manager, chef, or server to help order a healthier meal.  Planning Your Eating Strategy  Clinical staff conducted group or individual video education with verbal and written material and guidebook.  Patient learns about the [place] and its benefit of reducing the risk of disease. The [place] does not focus on calories. Instead, it emphasizes high-quality, nutrient-rich foods. By knowing the characteristics of the foods, we choose, we can determine their calorie density and make informed decisions.  Targeting Your Nutrition Priorities  Clinical staff conducted group or individual video education with verbal and written material and guidebook.  Patient learns that lifestyle habits have a tremendous impact on disease risk and progression. This video provides eating and physical activity recommendations based on your personal health goals, such as reducing LDL cholesterol, losing weight, preventing or controlling type 2 diabetes, and reducing high blood pressure.  Vitamins and Minerals  Clinical staff conducted group or individual video education with verbal and written material and guidebook.  Patient learns different ways to obtain key vitamins and minerals, including through a recommended healthy diet. It is important to discuss all supplements you take with your doctor.   Healthy Mind-Set    Smoking Cessation  Clinical staff conducted group or individual video education with verbal and written material and guidebook.  Patient learns that cigarette smoking and tobacco addiction pose a serious health risk which affects millions of people. Stopping smoking will significantly reduce the risk of heart disease, lung disease,  and many forms of cancer. Recommended strategies for quitting are covered, including working with your doctor to develop a successful plan.  Culinary   Becoming a Financial trader conducted group or individual video education with verbal and written material and guidebook.  Patient learns that cooking at home can be healthy, cost-effective, quick, and puts them in control. Keys to cooking healthy recipes will include looking at your recipe, assessing your equipment needs, planning ahead, making it simple, choosing cost-effective seasonal ingredients, and limiting the use of added fats, salts, and sugars.  Cooking - Breakfast and Snacks  Clinical staff conducted group or individual video education with verbal and written material and guidebook.  Patient learns how important breakfast is to satiety and nutrition through the entire day. Recommendations include key foods to eat during breakfast to help stabilize blood sugar levels and to prevent overeating at meals later in the day. Planning ahead is also a key component.  Cooking - Human resources officer conducted group or individual video education with verbal and written material and guidebook.  Patient learns eating strategies to improve overall health, including an approach to cook more at home. Recommendations include thinking of animal protein as a side on your plate rather than center stage and focusing instead on lower calorie dense options like vegetables, fruits, whole grains,  and plant-based proteins, such as beans. Making sauces in large quantities to freeze for later and leaving the skin on your vegetables are also recommended to maximize your experience.  Cooking - Healthy Salads and Dressing Clinical staff conducted group or individual video education with verbal and written material and guidebook.  Patient learns that vegetables, fruits, whole grains, and legumes are the foundations of the [place].  Recommendations include how to incorporate each of these in flavorful and healthy salads, and how to create homemade salad dressings. Proper handling of ingredients is also covered. Cooking - Soups and [employer] - Soups and Desserts Clinical staff conducted group or individual video education with verbal and written material and guidebook.  Patient learns that Pritikin soups and desserts make for easy, nutritious, and delicious snacks and meal components that are low in sodium, fat, sugar, and calorie density, while high in vitamins, minerals, and filling fiber. Recommendations include simple and healthy ideas for soups and desserts.   Overview     The Pritikin Solution Program Overview Clinical staff conducted group or individual video education with verbal and written material and guidebook.  Patient learns that the results of the [place] Program have been documented in more than 100 articles published in peer-reviewed journals, and the benefits include reducing risk factors for (and, in some cases, even reversing) high cholesterol, high blood pressure, type 2 diabetes, obesity, and more! An overview of the three key pillars of the Pritikin Program will be covered: eating well, doing regular exercise, and having a healthy mind-set.  WORKSHOPS  Exercise: Exercise Basics: Building Your Action Plan Clinical staff led group instruction and group discussion with PowerPoint presentation and patient guidebook. To enhance the learning environment the use of posters, models and videos may be added. At the conclusion of this workshop, patients will comprehend the difference between physical activity and exercise, as well as the benefits of incorporating both, into their routine. Patients will understand the FITT (Frequency, Intensity, Time, and Type) principle and how to use it to build an exercise action plan. In addition, safety concerns and other considerations for exercise and cardiac rehab  will be addressed by the presenter. The purpose of this lesson is to promote a comprehensive and effective weekly exercise routine in order to improve patients' overall level of fitness.   Managing Heart Disease: Your Path to a Healthier Heart Clinical staff led group instruction and group discussion with PowerPoint presentation and patient guidebook. To enhance the learning environment the use of posters, models and videos may be added.At the conclusion of this workshop, patients will understand the anatomy and physiology of the heart. Additionally, they will understand how Pritikin's three pillars impact the risk factors, the progression, and the management of heart disease.  The purpose of this lesson is to provide a high-level overview of the heart, heart disease, and how the Pritikin lifestyle positively impacts risk factors.  Exercise Biomechanics Clinical staff led group instruction and group discussion with PowerPoint presentation and patient guidebook. To enhance the learning environment the use of posters, models and videos may be added. Patients will learn how the structural parts of their bodies function and how these functions impact their daily activities, movement, and exercise. Patients will learn how to promote a neutral spine, learn how to manage pain, and identify ways to improve their physical movement in order to promote healthy living. The purpose of this lesson is to expose patients to common physical limitations that impact physical activity. Participants will  learn practical ways to adapt and manage aches and pains, and to minimize their effect on regular exercise. Patients will learn how to maintain good posture while sitting, walking, and lifting.  Balance Training and Fall Prevention  Clinical staff led group instruction and group discussion with PowerPoint presentation and patient guidebook. To enhance the learning environment the use of posters, models and videos  may be added. At the conclusion of this workshop, patients will understand the importance of their sensorimotor skills (vision, proprioception, and the vestibular system) in maintaining their ability to balance as they age. Patients will apply a variety of balancing exercises that are appropriate for their current level of function. Patients will understand the common causes for poor balance, possible solutions to these problems, and ways to modify their physical environment in order to minimize their fall risk. The purpose of this lesson is to teach patients about the importance of maintaining balance as they age and ways to minimize their risk of falling.  WORKSHOPS   Nutrition:  Fueling a Scientist, research (physical sciences) led group instruction and group discussion with PowerPoint presentation and patient guidebook. To enhance the learning environment the use of posters, models and videos may be added. Patients will review the foundational principles of the [place] and understand what constitutes a serving size in each of the food groups. Patients will also learn Pritikin-friendly foods that are better choices when away from home and review make-ahead meal and snack options. Calorie density will be reviewed and applied to three nutrition priorities: weight maintenance, weight loss, and weight gain. The purpose of this lesson is to reinforce (in a group setting) the key concepts around what patients are recommended to eat and how to apply these guidelines when away from home by planning and selecting Pritikin-friendly options. Patients will understand how calorie density may be adjusted for different weight management goals.  Mindful Eating  Clinical staff led group instruction and group discussion with PowerPoint presentation and patient guidebook. To enhance the learning environment the use of posters, models and videos may be added. Patients will briefly review the concepts of the [place] and the importance of low-calorie dense foods. The concept of mindful eating will be introduced as well as the importance of paying attention to internal hunger signals. Triggers for non-hunger eating and techniques for dealing with triggers will be explored. The purpose of this lesson is to provide patients with the opportunity to review the basic principles of the [place], discuss the value of eating mindfully and how to measure internal cues of hunger and fullness using the Hunger Scale. Patients will also discuss reasons for non-hunger eating and learn strategies to use for controlling emotional eating.  Targeting Your Nutrition Priorities Clinical staff led group instruction and group discussion with PowerPoint presentation and patient guidebook. To enhance the learning environment the use of posters, models and videos may be added. Patients will learn how to determine their genetic susceptibility to disease by reviewing their family history. Patients will gain insight into the importance of diet as part of an overall healthy lifestyle in mitigating the impact of genetics and other environmental insults. The purpose of this lesson is to provide patients with the opportunity to assess their personal nutrition priorities by looking at their family history, their own health history and current risk factors. Patients will also be able to discuss ways of prioritizing and modifying the [place] for their highest risk areas  Menu  Clinical  staff led group instruction and group discussion with PowerPoint presentation and patient guidebook. To enhance the learning environment the use of posters, models and videos may be added. Using menus brought in from [employer], or printed from [employer], patients will apply the [place] dining out guidelines that were presented in the [employer] video. Patients will also be able to practice these guidelines  in a variety of provided scenarios. The purpose of this lesson is to provide patients with the opportunity to practice hands-on learning of the [place] with actual menus and practice scenarios.  Label Reading Clinical staff led group instruction and group discussion with PowerPoint presentation and patient guidebook. To enhance the learning environment the use of posters, models and videos may be added. Patients will review and discuss the Pritikin label reading guidelines presented in Pritikin's Label Reading Educational series video. Using fool labels brought in from local grocery stores and markets, patients will apply the label reading guidelines and determine if the packaged food meet the Pritikin guidelines. The purpose of this lesson is to provide patients with the opportunity to review, discuss, and practice hands-on learning of the Pritikin Label Reading guidelines with actual packaged food labels. [place] Workshops are designed to teach patients ways to prepare quick, simple, and affordable recipes at home. The importance of nutrition's role in chronic disease risk reduction is reflected in its emphasis in the overall Pritikin program. By learning how to prepare essential core Pritikin Eating Plan recipes, patients will increase control over what they eat; be able to customize the flavor of foods without the use of added salt, sugar, or fat; and improve the quality of the food they consume. By learning a set of core recipes which are easily assembled, quickly prepared, and affordable, patients are more likely to prepare more healthy foods at home. These workshops focus on convenient breakfasts, simple entres, side dishes, and desserts which can be prepared with minimal effort and are consistent with nutrition recommendations for cardiovascular risk reduction. Cooking [employer] are taught by a Engineer, materials (RD) who has  been trained by the [employer]. The chef or RD has a clear understanding of the importance of minimizing - if not completely eliminating - added fat, sugar, and sodium in recipes. Throughout the series of [place] Workshop sessions, patients will learn about healthy ingredients and efficient methods of cooking to build confidence in their capability to prepare    Cooking School weekly topics:  Adding Flavor- Sodium-Free  Fast and Healthy Breakfasts  Powerhouse Plant-Based Proteins  Satisfying Salads and Dressings  Simple Sides and Sauces  International Cuisine-Spotlight on the [place] Zones  Delicious Desserts  Savory Soups  Efficiency Cooking - Meals in a Snap  Tasty Appetizers and Snacks  Comforting Weekend Breakfasts  One-Pot Wonders   Fast Evening Meals  Easy [place] (Psychosocial): New Thoughts, New Behaviors Clinical staff led group instruction and group discussion with PowerPoint presentation and patient guidebook. To enhance the learning environment the use of posters, models and videos may be added. Patients will learn and practice techniques for developing effective health and lifestyle goals. Patients will be able to effectively apply the goal setting process learned to develop at least one new personal goal.  The purpose of this lesson is to expose patients to a new skill set of behavior modification techniques such as techniques setting  SMART goals, overcoming barriers, and achieving new thoughts and new behaviors.  Managing Moods and Relationships Clinical staff led group instruction and group discussion with PowerPoint presentation and patient guidebook. To enhance the learning environment the use of posters, models and videos may be added. Patients will learn how emotional and chronic stress factors can impact their health and relationships. They will learn healthy ways to manage their  moods and utilize positive coping mechanisms. In addition, ICR patients will learn ways to improve communication skills. The purpose of this lesson is to expose patients to ways of understanding how one's mood and health are intimately connected. Developing a healthy outlook can help build positive relationships and connections with others. Patients will understand the importance of utilizing effective communication skills that include actively listening and being heard. They will learn and understand the importance of the "4 Cs" and especially Connections in fostering of a Healthy Mind-Set.  Healthy Sleep for a Healthy Heart Clinical staff led group instruction and group discussion with PowerPoint presentation and patient guidebook. To enhance the learning environment the use of posters, models and videos may be added. At the conclusion of this workshop, patients will be able to demonstrate knowledge of the importance of sleep to overall health, well-being, and quality of life. They will understand the symptoms of, and treatments for, common sleep disorders. Patients will also be able to identify daytime and nighttime behaviors which impact sleep, and they will be able to apply these tools to help manage sleep-related challenges. The purpose of this lesson is to provide patients with a general overview of sleep and outline the importance of quality sleep. Patients will learn about a few of the most common sleep disorders. Patients will also be introduced to the concept of "sleep hygiene," and discover ways to self-manage certain sleeping problems through simple daily behavior changes. Finally, the workshop will motivate patients by clarifying the links between quality sleep and their goals of heart-healthy living.   Recognizing and Reducing Stress Clinical staff led group instruction and group discussion with PowerPoint presentation and patient guidebook. To enhance the learning environment the use of  posters, models and videos may be added. At the conclusion of this workshop, patients will be able to understand the types of stress reactions, differentiate between acute and chronic stress, and recognize the impact that chronic stress has on their health. They will also be able to apply different coping mechanisms, such as reframing negative self-talk. Patients will have the opportunity to practice a variety of stress management techniques, such as deep abdominal breathing, progressive muscle relaxation, and/or guided imagery.  The purpose of this lesson is to educate patients on the role of stress in their lives and to provide healthy techniques for coping with it.  Learning Barriers/Preferences:  Learning Barriers/Preferences - 01/01/22 1500       Learning Barriers/Preferences   Learning Barriers Sight   wears glasses   Learning Preferences Audio;Computer/Internet;Group Instruction;Individual Instruction;Pictoral;Skilled Demonstration;Verbal Instruction;Video;Written Material             Education Topics:  Knowledge Questionnaire Score:  Knowledge Questionnaire Score - 01/01/22 1452       Knowledge Questionnaire Score   Pre Score 19/24             Core Components/Risk Factors/Patient Goals at Admission:  Personal Goals and Risk Factors at Admission - 01/01/22 1456       Core Components/Risk Factors/Patient Goals on Admission    Weight Management Yes;Obesity;Weight Loss    Intervention Weight  Management: Develop a combined nutrition and exercise program designed to reach desired caloric intake, while maintaining appropriate intake of nutrient and fiber, sodium and fats, and appropriate energy expenditure required for the weight goal.;Weight Management: Provide education and appropriate resources to help participant work on and attain dietary goals.;Weight Management/Obesity: Establish reasonable short term and long term weight goals.;Obesity: Provide education and appropriate  resources to help participant work on and attain dietary goals.    Admit Weight 194 lb 14.2 oz (88.4 kg)    Expected Outcomes Short Term: Continue to assess and modify interventions until short term weight is achieved;Long Term: Adherence to nutrition and physical activity/exercise program aimed toward attainment of established weight goal;Weight Maintenance: Understanding of the daily nutrition guidelines, which includes 25-35% calories from fat, 7% or less cal from saturated fats, less than 231m cholesterol, less than 1.5gm of sodium, & 5 or more servings of fruits and vegetables daily;Weight Loss: Understanding of general recommendations for a balanced deficit meal plan, which promotes 1-2 lb weight loss per week and includes a negative energy balance of [phone] kcal/d;Understanding recommendations for meals to include 15-35% energy as protein, 25-35% energy from fat, 35-60% energy from carbohydrates, less than 2055mof dietary cholesterol, 20-35 gm of total fiber daily;Understanding of distribution of calorie intake throughout the day with the consumption of 4-5 meals/snacks    Diabetes Yes    Intervention Provide education about signs/symptoms and action to take for hypo/hyperglycemia.;Provide education about proper nutrition, including hydration, and aerobic/resistive exercise prescription along with prescribed medications to achieve blood glucose in normal ranges: Fasting glucose 65-99 mg/dL    Expected Outcomes Long Term: Attainment of HbA1C < 7%.;Short Term: Participant verbalizes understanding of the signs/symptoms and immediate care of hyper/hypoglycemia, proper foot care and importance of medication, aerobic/resistive exercise and nutrition plan for blood glucose control.    Heart Failure Yes    Intervention Provide a combined exercise and nutrition program that is supplemented with education, support and counseling about heart failure. Directed toward relieving symptoms such as shortness of  breath, decreased exercise tolerance, and extremity edema.    Expected Outcomes Improve functional capacity of life;Short term: Attendance in program 2-3 days a week with increased exercise capacity. Reported lower sodium intake. Reported increased fruit and vegetable intake. Reports medication compliance.;Short term: Daily weights obtained and reported for increase. Utilizing diuretic protocols set by physician.;Long term: Adoption of self-care skills and reduction of barriers for early signs and symptoms recognition and intervention leading to self-care maintenance.    Hypertension Yes    Intervention Provide education on lifestyle modifcations including regular physical activity/exercise, weight management, moderate sodium restriction and increased consumption of fresh fruit, vegetables, and low fat dairy, alcohol moderation, and smoking cessation.;Monitor prescription use compliance.    Expected Outcomes Short Term: Continued assessment and intervention until BP is < 140/9046mG in hypertensive participants. < 130/14m47m in hypertensive participants with diabetes, heart failure or chronic kidney disease.;Long Term: Maintenance of blood pressure at goal levels.    Lipids Yes    Intervention Provide education and support for participant on nutrition & aerobic/resistive exercise along with prescribed medications to achieve LDL <70mg55mL >40mg.49mExpected Outcomes Short Term: Participant states understanding of desired cholesterol values and is compliant with medications prescribed. Participant is following exercise prescription and nutrition guidelines.;Long Term: Cholesterol controlled with medications as prescribed, with individualized exercise RX and with personalized nutrition plan. Value goals: LDL < 70mg, 22m> 40 mg.  Core Components/Risk Factors/Patient Goals Review:   Goals and Risk Factor Review     Row Name 01/09/22 1435 01/26/22 1754 02/17/22 1120         Core  Components/Risk Factors/Patient Goals Review   Personal Goals Review Weight Management/Obesity;Heart Failure;Stress;Diabetes;Lipids;Hypertension Weight Management/Obesity;Heart Failure;Stress;Diabetes;Lipids;Hypertension Weight Management/Obesity;Heart Failure;Stress;Diabetes;Lipids;Hypertension     Review Tanya Hogan started cardiac rehab and did well with exercise. Vital signs stable. Patient was given a snack prior to exercise. Tanya Hogan has been doing well with exercise at cardiac rehab. Vital signs stable and CBG's have been stable. Tanya Hogan is not taking Jardiance on her exercise days per Dr Madilyn Fireman. Tanya Hogan has been doing well with exercise at cardiac rehab. Vital signs stable and CBG's have been stable. [name]'s weight as been going up on the last 3 sessions. [facility] and primary care are aware.     Expected Outcomes Judywill continue to participate in phase 2 cardiac rehab for exercise, nutrition and lifestyle modifications Judywill continue to participate in phase 2 cardiac rehab for exercise, nutrition and lifestyle modifications Judywill continue to participate in phase 2 cardiac rehab for exercise, nutrition and lifestyle modifications              Core Components/Risk Factors/Patient Goals at Discharge (Final Review):   Goals and Risk Factor Review - 02/17/22 1120       Core Components/Risk Factors/Patient Goals Review   Personal Goals Review Weight Management/Obesity;Heart Failure;Stress;Diabetes;Lipids;Hypertension    Review Tanya Hogan has been doing well with exercise at cardiac rehab. Vital signs stable and CBG's have been stable. [name]'s weight as been going up on the last 3 sessions. [facility] and primary care are aware.    Expected Outcomes Judywill continue to participate in phase 2 cardiac rehab for exercise, nutrition and lifestyle modifications             ITP Comments:  ITP Comments     Row Name 01/01/22 1136 01/09/22 1427 01/26/22 1752 02/17/22 1117     ITP Comments Dr Fransico Him  MD, Medical Director 30 Day ITP Review. Tanya Hogan started exercise at cardiac rehab on 01/09/22 and did well with exercise. 30 Day ITP Review. Tanya Hogan has good attendance and participation in phase 2 cardiac rehab 30 Day ITP Review. Tanya Hogan has good attendance and participation in phase 2 cardiac rehab. Tanya Hogan will complete phase 2 cardiac rehab on 02/27/22             Comments: See ITP Comments

## 2022-02-18 NOTE — Progress Notes (Signed)
Remote pacemaker transmission.   

## 2022-02-19 DIAGNOSIS — G4737 Central sleep apnea in conditions classified elsewhere: Secondary | ICD-10-CM | POA: Diagnosis not present

## 2022-02-19 DIAGNOSIS — G4733 Obstructive sleep apnea (adult) (pediatric): Secondary | ICD-10-CM | POA: Diagnosis not present

## 2022-02-20 ENCOUNTER — Encounter (HOSPITAL_COMMUNITY)
Admission: RE | Admit: 2022-02-20 | Discharge: 2022-02-20 | Disposition: A | Discharge status: Home / Self Care | Payer: Medicare Other | Source: Ambulatory Visit | Attending: Cardiology | Admitting: Cardiology

## 2022-02-20 ENCOUNTER — Ambulatory Visit: Payer: Medicare Other | Admitting: Internal Medicine

## 2022-02-20 ENCOUNTER — Encounter (HOSPITAL_COMMUNITY): Payer: Medicare Other

## 2022-02-20 ENCOUNTER — Encounter: Payer: Self-pay | Admitting: Internal Medicine

## 2022-02-20 VITALS — BP 130/70 | HR 75 | Ht 61.0 in | Wt 204.0 lb

## 2022-02-20 DIAGNOSIS — E118 Type 2 diabetes mellitus with unspecified complications: Secondary | ICD-10-CM

## 2022-02-20 DIAGNOSIS — I5022 Chronic systolic (congestive) heart failure: Secondary | ICD-10-CM

## 2022-02-20 DIAGNOSIS — E1169 Type 2 diabetes mellitus with other specified complication: Secondary | ICD-10-CM

## 2022-02-20 DIAGNOSIS — I10 Essential (primary) hypertension: Secondary | ICD-10-CM

## 2022-02-20 DIAGNOSIS — Z950 Presence of cardiac pacemaker: Secondary | ICD-10-CM | POA: Diagnosis not present

## 2022-02-20 DIAGNOSIS — E785 Hyperlipidemia, unspecified: Secondary | ICD-10-CM

## 2022-02-20 NOTE — Patient Instructions (Signed)
Medication Instructions:  Your physician recommends that you continue on your current medications as directed. Please refer to the Current Medication list given to you today.  Labwork: None ordered.  Testing/Procedures: None ordered.  Follow-Up: Your physician wants you to follow-up in: one year with Cristopher Peru, MD or one of the following Advanced Practice Providers on your designated Care Team:   Tommye Standard, [place] Legrand Como "Jonni Sanger" Chalmers Cater, [place]  Remote monitoring is used to monitor your Pacemaker from home. This monitoring reduces the number of office visits required to check your device to one time per year. It allows [place] to keep an eye on the functioning of your device to ensure it is working properly. You are scheduled for a device check from home on 05/05/2022. You may send your transmission at any time that day. If you have a wireless device, the transmission will be sent automatically. After your physician reviews your transmission, you will receive a postcard with your next transmission date.  Any Other Special Instructions Will Be Listed Below (If Applicable).  If you need a refill on your cardiac medications before your next appointment, please call your pharmacy.   Important Information About Sugar

## 2022-02-20 NOTE — Progress Notes (Signed)
HPI Mrs. Stonerock returns today for followup. She is a pleasant  71 yo woman with chronic systolic heart failure and CHB, s/p Biv PPM insertion. She has had some increased thresholds and we upgraded her to a left bundle area lead in the RV port in conjunction with her old RA and LV leads.She has breast CA. She is s/p surgery. She has also had a breast resection.No syncope. Her dyspnea is improved.  Allergies  Allergen Reactions   Injectafer [Ferric Carboxymaltose] Shortness Of Breath    Mild SOB following injectafer infusion Mild SOB following injectafer infusion    Penicillins Anaphylaxis and Rash    Has patient had a PCN reaction causing immediate rash, facial/tongue/throat swelling, SOB or lightheadedness with hypotension: Yes Has patient had a PCN reaction causing severe rash involving mucus membranes or skin necrosis: No Has patient had a PCN reaction that required hospitalization: No Has patient had a PCN reaction occurring within the last 10 years: No If all of the above answers are "NO", then may proceed with Cephalosporin use. "Stuttered with medication"     Accupril [Quinapril Hcl] Other (See Comments)    Hyperkalemia=high potassium level   Aleve [Naproxen] Swelling   Celebrex [Celecoxib]     ulcers   Cozaar [Losartan Potassium] Itching   Flonase [Fluticasone] Other (See Comments)    Nasal ulcer.     Levomenol Other (See Comments)    unknown unknown    Lipitor [Atorvastatin] Other (See Comments)    muscle aches/pains   Nasonex [Mometasone Furoate] Other (See Comments)    nosebleed   Xyzal [Levocetirizine] Other (See Comments)    Doesn't recall reaction type   Chamomile Other (See Comments)    unknown   Vesicare [Solifenacin] Other (See Comments)    "stopped me from urinating"   Aspirin Other (See Comments)    Pt states "burns holes in stomach", ulcers    Baking Soda-Fluoride [Sodium Fluoride] Itching and Rash   Clindamycin/Lincomycin Rash   Codeine Nausea  And Vomiting   Furosemide Other (See Comments)    Nose bleed   Lactose Intolerance (Gi) Other (See Comments)    Increased calcium, sneezing, nasal issues   Lanolin-Petrolatum Rash   Metformin And Related Diarrhea   Miralax [Polyethylene Glycol] Rash   Quinine Rash   Reclast [Zoledronic Acid] Rash and Other (See Comments)    shaking   Rifampin Other (See Comments)    DOES NOT REMEMBER THIS MEDICATION OR ALLERGY   Sulfa Antibiotics Rash   Sulfonamide Derivatives Rash   Tramadol Rash    Anxiety   Wellbutrin [Bupropion] Other (See Comments)    Muscle aches, pain, "made my skin crawl"   Wool Alcohol [Lanolin] Rash     Current Outpatient Medications  Medication Sig Dispense Refill   acetaminophen (TYLENOL) 500 MG tablet Take 500-1,000 mg by mouth every 6 (six) hours as needed (for pain.).     albuterol (PROVENTIL) (2.5 MG/3ML) 0.083% nebulizer solution Take 2.5 mg by nebulization every 6 (six) hours as needed for wheezing or shortness of breath.     albuterol (VENTOLIN HFA) 108 (90 Base) MCG/ACT inhaler Inhale 1-2 puffs into the lungs every 6 (six) hours as needed for wheezing or shortness of breath. 54 g 3   alendronate (FOSAMAX) 70 MG tablet TAKE 1 TABLET BY MOUTH  EVERY 7 DAYS WITH A FULL  GLASS OF WATER ON AN EMPTY  STOMACH 12 tablet 4   AMBULATORY NON FORMULARY MEDICATION Medication Name: Tubing and  supplies for nebulizer. Fax - [phone] 1 vial 0   amiodarone (PACERONE) 200 MG tablet TAKE ONE-HALF TABLET BY  MOUTH DAILY 45 tablet 0   B Complex-C (B-COMPLEX WITH VITAMIN C) tablet Take 1 tablet by mouth in the morning.     bumetanide (BUMEX) 1 MG tablet TAKE 1 TABLET BY MOUTH TWICE  DAILY 180 tablet 3   carvedilol (COREG) 3.125 MG tablet Take 1 tablet (3.125 mg total) by mouth 2 (two) times daily. 180 tablet 3   Continuous Blood Gluc Receiver (FREESTYLE LIBRE 2 READER) DEVI by Does not apply route.     Continuous Blood Gluc Sensor (FREESTYLE LIBRE 2 SENSOR) MISC by Does not  apply route.     EPINEPHrine 0.3 mg/0.3 mL IJ SOAJ injection Inject 0.3 mg into the muscle as needed for anaphylaxis. Then call 911 1 each 1   fexofenadine (ALLEGRA) 180 MG tablet Take 180 mg by mouth in the morning.     fluticasone-salmeterol (ADVAIR HFA) 115-21 MCG/ACT inhaler Inhale 2 puffs into the lungs 2 (two) times daily.     levothyroxine (SYNTHROID) 88 MCG tablet TAKE 1 TABLET(88 MCG) BY MOUTH DAILY 90 tablet 1   LINZESS 72 MCG capsule Take 72-144 mcg by mouth 4 (four) times daily as needed (constipation.).     Multiple Vitamins-Minerals (OCUVITE EYE + MULTI) TABS Take 1 tablet by mouth in the morning.     pantoprazole (PROTONIX) 40 MG tablet Take 1 tablet (40 mg total) by mouth daily before breakfast. 90 tablet 0   PAPAYA ENZYME PO Take 1 capsule by mouth in the morning.     potassium chloride (KLOR-CON M) 10 MEQ tablet TAKE 1 TABLET(10 MEQ) BY MOUTH DAILY 90 tablet 0   pravastatin (PRAVACHOL) 40 MG tablet TAKE 1 TABLET BY MOUTH AT  BEDTIME 90 tablet 3   Probiotic Product ([employer] PROBIOTIC) CAPS Take 2 capsules by mouth in the morning.     Semaglutide,0.25 or 0.5MG/DOS, (OZEMPIC, 0.25 OR 0.5 MG/DOSE,) 2 MG/1.5ML SOPN Inject 0.25 mg into the skin every Saturday.     Tiotropium Bromide Monohydrate (SPIRIVA RESPIMAT) 1.25 MCG/ACT AERS Inhale 2 puffs into the lungs daily.     valACYclovir (VALTREX) 1000 MG tablet Take 1 tablet (1,000 mg total) by mouth 2 (two) times daily. (Patient taking differently: Take 1,000 mg by mouth 2 (two) times daily as needed (fever blisters/cold sores.).) 14 tablet 2   No current facility-administered medications for this visit.     Past Medical History:  Diagnosis Date   Alkaline phosphatase elevation    Asthma    Breast cancer ([place]) 2011   Carpal tunnel syndrome    Carpal tunnel syndrome    bilateral   CHF (congestive heart failure) (HCC)    Cirrhosis, non-alcoholic (HCC)    Closed fracture of unspecified part of radius (alone)    DDD (degenerative  disc disease), lumbar    Depression    pt denies   Diabetes mellitus without complication ([place])    Enlarged heart    Fibromyalgia    GERD (gastroesophageal reflux disease)    History of kidney stones    HTN (hypertension)    Hypothyroidism    IBS (irritable bowel syndrome)    LBBB (left bundle branch block)    Microcytic anemia 05/14/2017   Pneumonia    Presence of permanent cardiac pacemaker    PVD (peripheral vascular disease) ([place])    2019   Sleep apnea    uses cpap    ROS:  All systems reviewed and negative except as noted in the HPI.   Past Surgical History:  Procedure Laterality Date   BIV PACEMAKER GENERATOR CHANGEOUT N/A 11/03/2021   Procedure: BIV PACEMAKER GENERATOR CHANGEOUT;  Surgeon: Evans Lance, MD;  Location: [facility];  Service: Cardiovascular;  Laterality: N/A;   BREAST LUMPECTOMY Left 2011   COLONOSCOPY     FOOT SURGERY     KNEE SURGERY     LEAD REVISION/REPAIR N/A 11/03/2021   Procedure: LEAD REVISION/REPAIR;  Surgeon: Evans Lance, MD;  Location: [facility];  Service: Cardiovascular;  Laterality: N/A;   MASTECTOMY Right    05/2019   PACEMAKER GENERATOR CHANGE N/A 08/23/2014   Procedure: PACEMAKER GENERATOR CHANGE;  Surgeon: Evans Lance, MD;  Location: [facility];  Service: Cardiovascular;  Laterality: N/A;   PACEMAKER PLACEMENT  2009   [facility]   RECTAL SURGERY  1976   REDUCTION MAMMAPLASTY Left    05/2019   TOTAL SHOULDER ARTHROPLASTY Right 03/10/2018   TOTAL SHOULDER ARTHROPLASTY Right 03/10/2018   Procedure: RIGHT TOTAL SHOULDER ARTHROPLASTY;  Surgeon: [name], MD;  Location: [facility];  Service: Orthopedics;  Laterality: Right;   TUBAL LIGATION       Family History  Problem Relation Age of Onset   Heart disease Mother    Melanoma Mother    Parkinson's disease Mother    Heart disease Father    Heart disease Brother    Heart failure Other    Breast cancer Other    Hypertension Other       Social History   Socioeconomic History   Marital status: Divorced    Spouse name: Not on file   Number of children: 2   Years of education: 14   Highest education level: Associate degree: academic program  Occupational History   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 Lives with and helps her brother. Likes Systems developer, Social worker and music. Loves animals.   Social Determinants of Health   Financial Resource Strain: Low Risk  (02/25/2021)   Overall Financial Resource Strain (CARDIA)    Difficulty of Paying Living Expenses: Not hard at all  Food Insecurity: No Food Insecurity (02/25/2021)   Hunger Vital Sign    Worried About Running Out of Food in the Last Year: Never true    Ran Out of Food in the Last Year: Never true  Transportation Needs: No Transportation Needs (02/25/2021)   PRAPARE - Hydrologist (Medical): No    Lack of Transportation (Non-Medical): No  Physical Activity: Inactive (02/25/2021)   Exercise Vital Sign    Days of Exercise per Week: 0 days    Minutes of Exercise per Session: 0 min  Stress: No Stress Concern Present (02/25/2021)   [place]    Feeling of Stress : Not at all  Social  Connections: Socially Isolated (02/25/2021)   Social Connection and Isolation Panel [NHANES]    Frequency of Communication with Friends and Family: More than three times a week    Frequency of Social Gatherings with Friends and Family: Once a  week    Attends Religious Services: Never    Marine scientist or Organizations: No    Attends Archivist Meetings: Never    Marital Status: Divorced  Human resources officer Violence: Not At Risk (02/25/2021)   Humiliation, Afraid, Rape, and Kick questionnaire    Fear of Current or Ex-Partner: No    Emotionally Abused: No    Physically Abused: No    Sexually Abused: No     BP 130/70   Pulse 75   Ht 5' 1"  (1.549 m)   Wt 204 lb (92.5 kg)   SpO2 96%   BMI 38.55 kg/m   Physical Exam:  Well appearing NAD HEENT: Unremarkable Neck:  No JVD, no thyromegally Lymphatics:  No adenopathy Back:  No CVA tenderness Lungs:  Clear with no wheezes HEART:  Regular rate rhythm, no murmurs, no rubs, no clicks Abd:  soft, positive bowel sounds, no organomegally, no rebound, no guarding Ext:  2 plus pulses, no edema, no cyanosis, no clubbing Skin:  No rashes no nodules Neuro:  CN II through XII intact, motor grossly intact  EKG - NSR with biv pacing  DEVICE  Normal device function.  See PaceArt for details.   Assess/Plan:   Chronic systolic heart failure - her symptoms are improved after placement of a left bundle area lead. She will undergo watchful waiting.   Biv PPM - she is approaching ERI.  3. Obesity her bmi is 61. She will need to work on this. She is taking ozempic. 4. PVC's - these are stable as she is v. Paced 99%.    Carleene Overlie Cristoval Teall,MD

## 2022-02-23 ENCOUNTER — Encounter (HOSPITAL_COMMUNITY)
Admission: RE | Admit: 2022-02-23 | Discharge: 2022-02-23 | Disposition: A | Discharge status: Home / Self Care | Payer: Medicare Other | Source: Ambulatory Visit | Attending: Cardiology | Admitting: Cardiology

## 2022-02-23 ENCOUNTER — Other Ambulatory Visit: Payer: Self-pay | Admitting: Internal Medicine

## 2022-02-23 VITALS — Ht 61.0 in | Wt 200.6 lb

## 2022-02-23 DIAGNOSIS — I5022 Chronic systolic (congestive) heart failure: Secondary | ICD-10-CM | POA: Insufficient documentation

## 2022-02-25 ENCOUNTER — Telehealth: Payer: Self-pay | Admitting: Cardiology

## 2022-02-25 ENCOUNTER — Encounter (HOSPITAL_COMMUNITY)
Admission: RE | Admit: 2022-02-25 | Discharge: 2022-02-25 | Disposition: A | Discharge status: Home / Self Care | Payer: Medicare Other | Source: Ambulatory Visit | Attending: Cardiology | Admitting: Cardiology

## 2022-02-25 DIAGNOSIS — I5022 Chronic systolic (congestive) heart failure: Secondary | ICD-10-CM | POA: Diagnosis not present

## 2022-02-25 MED ORDER — BUMETANIDE 2 MG PO TABS: 2.0000 mg | ORAL_TABLET | Freq: Two times a day (BID) | ORAL | 0 refills | Status: DC

## 2022-02-25 MED ORDER — POTASSIUM CHLORIDE CRYS ER 10 MEQ PO TBCR: 10.0000 meq | EXTENDED_RELEASE_TABLET | Freq: Two times a day (BID) | ORAL | 0 refills | Status: DC

## 2022-02-25 NOTE — Telephone Encounter (Signed)
Spoke with pt in regards to wt gain reported by Cardiac Rehab nurse.  Pt reports the following daily wts:  7/1- 200 lbs 7/2- 200 lbs 7/3- 200 lbs 7/4- 201 lbs 7/5-205 lbs Has a little SOB thinks could be r/t asthma.  Wonders if wt gain could be r/t to linzess (has taken for a couple yrs per pt) and ozempic which causes explosive bowels.   Asked about diet: reports ate cook out food over the holidays also had an roast beef sandwich from [employer].  Has BLE edema.  Feels that she drinks enough fluids.  Does not check BP at home last checked at Cardiac rehab OV. Has not missed any doses of Bumex 1 mg PO BID.  Last BNP 294 on 02/16/22  Last echo 01/08/22:  Freada Bergeron, MD  01/08/2022  4:00 PM EDT     Echo shows that her pumping function is stable at 20-25% with no significant changes from her prior echo report from [place]. No changes in medications at this time. We will continue to monitor going forward.   Will route to MD to review

## 2022-02-25 NOTE — Telephone Encounter (Signed)
Called pt reviewed MD recommendations.  Pt is agreeable to plan.  Scheduled f/u OV with Harriet Pho, [place] on 03/03/22 at 8:45 advised pt that labs will be drawn at this visit.

## 2022-02-25 NOTE — Addendum Note (Signed)
Addended by: Precious Gilding on: 02/25/2022 05:19 PM   Modules accepted: Orders

## 2022-02-25 NOTE — Telephone Encounter (Signed)
Called and spoke to the patient. She reports weight gain and LE edema. We will increase her bumex to 73m BID for 3 days and potassium to 119m BID for 3 days. Can we get her in for a visit with APP next week if there is an opening with a BMET at that time to assess her renal function and K?  She is scheduled for cardiac rehab this Friday and they will also send in her weights.   Thank you!

## 2022-02-25 NOTE — Telephone Encounter (Signed)
Maria a RN with Cardiac rehab is calling stating today the pt's weight was 93.5 KG. On 02/23/22 she was 91 KG, so she is up 2.5 KG since Monday. She reports she is documenting in Epic not regarding this now and is requesting the patient be contacted by our office regarding the weight gain.

## 2022-02-25 NOTE — Telephone Encounter (Signed)
Left a message to call back.

## 2022-02-25 NOTE — Telephone Encounter (Signed)
Patient is returning call. Transferred to RN.

## 2022-02-25 NOTE — Progress Notes (Addendum)
Weight gain noted today 93.5 kg. Weight on Monday was 91.0 kg. Which is a gain of 2.5 kg/ 5.5 lbs. Patient denies shortness of breath. Oxygen saturation 98% on room air. Lung fields clear upon ascultation. Trace bilateral lower extremity edema noted. Medication reviewed.Taking as prescribed. Abdomen soft. [name] reported eating an [employer] roast beef sandwich last evening and a 1/2 egg salad sandwich this morning for breakfast pre packaged from  [facility]. Dr Jacolyn Reedy office called and notified about weight gain. No complaints upon exit from cardiac rehab. [name] will complete cardiac rehab on Friday.Barnet Pall, RN,BSN 02/25/2022 12:04 PM

## 2022-02-27 ENCOUNTER — Encounter (HOSPITAL_COMMUNITY)
Admission: RE | Admit: 2022-02-27 | Discharge: 2022-02-27 | Disposition: A | Discharge status: Home / Self Care | Payer: Medicare Other | Source: Ambulatory Visit | Attending: Cardiology | Admitting: Cardiology

## 2022-02-27 VITALS — BP 110/80 | HR 68 | Ht 61.0 in | Wt 210.1 lb

## 2022-02-27 DIAGNOSIS — I5022 Chronic systolic (congestive) heart failure: Secondary | ICD-10-CM

## 2022-02-27 NOTE — Progress Notes (Signed)
Discharge Progress Report  Patient Details  Name: Tanya Hogan MRN: [id number] Date of Birth: [birth year] Referring Provider:   Flowsheet Row CARDIAC REHAB PHASE II ORIENTATION from 01/01/2022 in [place]  Referring Provider Gwyndolyn Kaufman, MD        Number of Visits: 17  Reason for Discharge:  Patient reached a stable level of exercise. Patient independent in their exercise. Patient has met program and personal goals.  Smoking History:  Social History   Tobacco Use  Smoking Status Former   Packs/day: 0.00   Years: 0.00   Total pack years: 0.00   Types: Cigarettes   Quit date: 08/24/1966   Years since quitting: 55.5  Smokeless Tobacco Never    Diagnosis:  Heart failure, chronic systolic ([place])  ADL [facility]:   Initial Exercise Prescription:  Initial Exercise Prescription - 01/01/22 1500       Date of Initial Exercise RX and Referring Provider   Date 01/01/22    Referring Provider Gwyndolyn Kaufman, MD    Expected Discharge Date 02/27/22      NuStep   Level 1    SPM 75    Minutes 25    METs 1.5      Prescription Details   Frequency (times per week) 3    Duration Progress to 30 minutes of continuous aerobic without signs/symptoms of physical distress      Intensity   THRR 40-80% of Max Heartrate 60-118    Ratings of Perceived Exertion 11-13    Perceived Dyspnea 0-4      Progression   Progression Continue progressive overload as per policy without signs/symptoms or physical distress.      Resistance Training   Training Prescription Yes    Weight 1 lb    Reps 10-15             Discharge Exercise Prescription (Final Exercise Prescription Changes):  Exercise Prescription Changes - 02/27/22 1027       Response to Exercise   Blood Pressure (Admit) 110/80    Blood Pressure (Exercise) 146/78    Blood Pressure (Exit) 128/78    Heart Rate (Admit) 68 bpm    Heart Rate (Exercise) 88 bpm    Heart Rate (Exit) 66 bpm     Rating of Perceived Exertion (Exercise) 11    Symptoms None    Comments Patient completed the cardiac rehab program.    Duration Progress to 30 minutes of  aerobic without signs/symptoms of physical distress    Intensity THRR unchanged      Progression   Progression Continue to progress workloads to maintain intensity without signs/symptoms of physical distress.    Average METs 1.6      Resistance Training   Training Prescription Yes    Weight 1 lb    Reps 10-15    Time 10 Minutes      Interval Training   Interval Training No      NuStep   Level 1    SPM 85    Minutes 30    METs 1.6      Home Exercise Plan   Plans to continue exercise at Home (comment)   Walking   Frequency Add 1 additional day to program exercise sessions.    Initial Home Exercises Provided 02/06/22             Functional Capacity:  6 Minute Walk     Row Name 01/01/22 1210 02/23/22 1037  6 Minute Walk   Phase Initial Discharge    Distance 952 feet 1336 feet    Distance % Change -- 40.34 %    Distance Feet Change -- 384 ft    Walk Time 6 minutes 6 minutes    # of Rest Breaks 0 0    MPH 1.8 2.53    METS 1.54 2.35    RPE 11 12    Perceived Dyspnea  0 2    VO2 Peak 5.39 8.2    Symptoms No Yes (comment)    Comments -- bilateral foot pain 3/10    Resting HR 62 bpm 64 bpm    Resting BP 104/72 112/68    Resting Oxygen Saturation  97 % 97 %    Exercise Oxygen Saturation  during 6 min walk 97 % 98 %    Max Ex. HR 97 bpm 112 bpm    Max Ex. BP 124/74 130/70    2 Minute Post BP 120/78 102/60      Interval HR   1 Minute HR 93 --    2 Minute HR 95 --    3 Minute HR 97 --    4 Minute HR 97 --    5 Minute HR 97 --    6 Minute HR 97 --    2 Minute Post HR 67 --    Interval Heart Rate? Yes --      Interval Oxygen   Interval Oxygen? Yes --    Baseline Oxygen Saturation % 97 % --    1 Minute Oxygen Saturation % 98 % --    1 Minute Liters of Oxygen 0 L --    2 Minute Oxygen Saturation  % 97 % --    2 Minute Liters of Oxygen 0 L --    3 Minute Oxygen Saturation % 97 % --    3 Minute Liters of Oxygen 0 L --    4 Minute Oxygen Saturation % 97 % --    4 Minute Liters of Oxygen 0 L --    5 Minute Oxygen Saturation % 97 % --    5 Minute Liters of Oxygen 0 L --    6 Minute Oxygen Saturation % 97 % --    6 Minute Liters of Oxygen 0 L --    2 Minute Post Oxygen Saturation % 97 % --             Psychological, QOL, Others - Outcomes: PHQ 2/9:    02/27/2022   11:55 AM 01/01/2022    3:34 PM 10/16/2021    3:58 PM 09/23/2021   12:20 PM 09/16/2021    9:34 AM  Depression screen PHQ 2/9  Decreased Interest 0 0 0 0 0  Down, Depressed, Hopeless 0 0 0 0 0  PHQ - 2 Score 0 0 0 0 0    Quality of Life:  Quality of Life - 02/27/22 1150       Quality of Life   Select Quality of Life      Quality of Life Scores   Health/Function Pre 22.83 %    Health/Function Post 17.67 %    Health/Function % Change -22.6 %    Socioeconomic Pre 20.31 %    Socioeconomic Post 17.57 %    Socioeconomic % Change  -13.49 %    Psych/Spiritual Pre 25.71 %    Psych/Spiritual Post 27.14 %    Psych/Spiritual % Change 5.56 %    Family Pre 22.9 %  Family Post 18.13 %    Family % Change -20.83 %    GLOBAL Pre 22.84 %    GLOBAL Post 19.71 %    GLOBAL % Change -13.7 %             Personal Goals: Goals established at orientation with interventions provided to work toward goal.  Personal Goals and Risk Factors at Admission - 01/01/22 1456       Core Components/Risk Factors/Patient Goals on Admission    Weight Management Yes;Obesity;Weight Loss    Intervention Weight Management: Develop a combined nutrition and exercise program designed to reach desired caloric intake, while maintaining appropriate intake of nutrient and fiber, sodium and fats, and appropriate energy expenditure required for the weight goal.;Weight Management: Provide education and appropriate resources to help participant work  on and attain dietary goals.;Weight Management/Obesity: Establish reasonable short term and long term weight goals.;Obesity: Provide education and appropriate resources to help participant work on and attain dietary goals.    Admit Weight 194 lb 14.2 oz (88.4 kg)    Expected Outcomes Short Term: Continue to assess and modify interventions until short term weight is achieved;Long Term: Adherence to nutrition and physical activity/exercise program aimed toward attainment of established weight goal;Weight Maintenance: Understanding of the daily nutrition guidelines, which includes 25-35% calories from fat, 7% or less cal from saturated fats, less than 260m cholesterol, less than 1.5gm of sodium, & 5 or more servings of fruits and vegetables daily;Weight Loss: Understanding of general recommendations for a balanced deficit meal plan, which promotes 1-2 lb weight loss per week and includes a negative energy balance of [phone] kcal/d;Understanding recommendations for meals to include 15-35% energy as protein, 25-35% energy from fat, 35-60% energy from carbohydrates, less than 2018mof dietary cholesterol, 20-35 gm of total fiber daily;Understanding of distribution of calorie intake throughout the day with the consumption of 4-5 meals/snacks    Diabetes Yes    Intervention Provide education about signs/symptoms and action to take for hypo/hyperglycemia.;Provide education about proper nutrition, including hydration, and aerobic/resistive exercise prescription along with prescribed medications to achieve blood glucose in normal ranges: Fasting glucose 65-99 mg/dL    Expected Outcomes Long Term: Attainment of HbA1C < 7%.;Short Term: Participant verbalizes understanding of the signs/symptoms and immediate care of hyper/hypoglycemia, proper foot care and importance of medication, aerobic/resistive exercise and nutrition plan for blood glucose control.    Heart Failure Yes    Intervention Provide a combined exercise and  nutrition program that is supplemented with education, support and counseling about heart failure. Directed toward relieving symptoms such as shortness of breath, decreased exercise tolerance, and extremity edema.    Expected Outcomes Improve functional capacity of life;Short term: Attendance in program 2-3 days a week with increased exercise capacity. Reported lower sodium intake. Reported increased fruit and vegetable intake. Reports medication compliance.;Short term: Daily weights obtained and reported for increase. Utilizing diuretic protocols set by physician.;Long term: Adoption of self-care skills and reduction of barriers for early signs and symptoms recognition and intervention leading to self-care maintenance.    Hypertension Yes    Intervention Provide education on lifestyle modifcations including regular physical activity/exercise, weight management, moderate sodium restriction and increased consumption of fresh fruit, vegetables, and low fat dairy, alcohol moderation, and smoking cessation.;Monitor prescription use compliance.    Expected Outcomes Short Term: Continued assessment and intervention until BP is < 140/9096mG in hypertensive participants. < 130/25m2m in hypertensive participants with diabetes, heart failure or chronic kidney disease.;Long Term: Maintenance of  blood pressure at goal levels.    Lipids Yes    Intervention Provide education and support for participant on nutrition & aerobic/resistive exercise along with prescribed medications to achieve LDL <29m, HDL >43m    Expected Outcomes Short Term: Participant states understanding of desired cholesterol values and is compliant with medications prescribed. Participant is following exercise prescription and nutrition guidelines.;Long Term: Cholesterol controlled with medications as prescribed, with individualized exercise RX and with personalized nutrition plan. Value goals: LDL < 705mHDL > 40 mg.              Personal  Goals Discharge:  Goals and Risk Factor Review     Row Name 01/09/22 1435 01/26/22 1754 02/17/22 1120 03/04/22 0950       Core Components/Risk Factors/Patient Goals Review   Personal Goals Review Weight Management/Obesity;Heart Failure;Stress;Diabetes;Lipids;Hypertension Weight Management/Obesity;Heart Failure;Stress;Diabetes;Lipids;Hypertension Weight Management/Obesity;Heart Failure;Stress;Diabetes;Lipids;Hypertension Weight Management/Obesity;Heart Failure;Stress;Diabetes;Lipids;Hypertension    Review JudBethena Roysarted cardiac rehab and did well with exercise. Vital signs stable. Patient was given a snack prior to exercise. JudBethena Royss been doing well with exercise at cardiac rehab. Vital signs stable and CBG's have been stable. JudBethena Roys not taking Jardiance on her exercise days per Dr MetMadilyn FiremanudBethena Royss been doing well with exercise at cardiac rehab. Vital signs stable and CBG's have been stable. [name]'s weight as been going up on the last 3 sessions. [facility] and primary care are aware. JudBethena Royss been doing well with exercise at cardiac rehab. Vital signs stable and CBG's have been stable. JudBethena Roysmpleted phase 2 cardiac rehab on 02/27/22.    Expected Outcomes Judywill continue to participate in phase 2 cardiac rehab for exercise, nutrition and lifestyle modifications Judywill continue to participate in phase 2 cardiac rehab for exercise, nutrition and lifestyle modifications Judywill continue to participate in phase 2 cardiac rehab for exercise, nutrition and lifestyle modifications Judywill continue to  exercise, follow  nutrition and lifestyle modifications upon completion of phase 2 cardiac rehab.             Exercise Goals and Review:  Exercise Goals     Row Name 01/01/22 1502             Exercise Goals   Increase Physical Activity Yes       Intervention Provide advice, education, support and counseling about physical activity/exercise needs.;Develop an individualized exercise prescription  for aerobic and resistive training based on initial evaluation findings, risk stratification, comorbidities and participant's personal goals.       Expected Outcomes Short Term: Attend rehab on a regular basis to increase amount of physical activity.;Long Term: Add in home exercise to make exercise part of routine and to increase amount of physical activity.;Long Term: Exercising regularly at least 3-5 days a week.       Increase Strength and Stamina Yes       Intervention Provide advice, education, support and counseling about physical activity/exercise needs.;Develop an individualized exercise prescription for aerobic and resistive training based on initial evaluation findings, risk stratification, comorbidities and participant's personal goals.       Expected Outcomes Short Term: Increase workloads from initial exercise prescription for resistance, speed, and METs.;Short Term: Perform resistance training exercises routinely during rehab and add in resistance training at home;Long Term: Improve cardiorespiratory fitness, muscular endurance and strength as measured by increased METs and functional capacity (6MWT)       Able to understand and use rate of perceived exertion (RPE) scale Yes       Intervention Provide education  and explanation on how to use RPE scale       Expected Outcomes Short Term: Able to use RPE daily in rehab to express subjective intensity level;Long Term:  Able to use RPE to guide intensity level when exercising independently       Knowledge and understanding of Target Heart Rate Range (THRR) Yes       Intervention Provide education and explanation of THRR including how the numbers were predicted and where they are located for reference       Expected Outcomes Short Term: Able to state/look up THRR;Long Term: Able to use THRR to govern intensity when exercising independently;Short Term: Able to use daily as guideline for intensity in rehab       Understanding of Exercise  Prescription Yes       Intervention Provide education, explanation, and written materials on patient's individual exercise prescription       Expected Outcomes Short Term: Able to explain program exercise prescription;Long Term: Able to explain home exercise prescription to exercise independently                Exercise Goals Re-Evaluation:  Exercise Goals Re-Evaluation     Row Name 01/09/22 1134 01/14/22 1106 02/06/22 1056 02/09/22 1056 02/27/22 1138     Exercise Goal Re-Evaluation   Exercise Goals Review Increase Physical Activity;Able to understand and use rate of perceived exertion (RPE) scale Increase Physical Activity;Able to understand and use rate of perceived exertion (RPE) scale Increase Physical Activity;Able to understand and use rate of perceived exertion (RPE) scale;Understanding of Exercise Prescription;Increase Strength and Stamina;Knowledge and understanding of Target Heart Rate Range (THRR) Increase Physical Activity;Able to understand and use rate of perceived exertion (RPE) scale;Understanding of Exercise Prescription;Increase Strength and Stamina;Knowledge and understanding of Target Heart Rate Range (THRR) Increase Physical Activity;Able to understand and use rate of perceived exertion (RPE) scale;Understanding of Exercise Prescription;Increase Strength and Stamina;Knowledge and understanding of Target Heart Rate Range (THRR)   Comments Patient able to understand and use RPE scale appropriately. Patient was clipping and pulling vines yesterday and "gave out". Discussed increasing MET level and endurance increasing as she progresses with exericse. Patient asked how often she should be doing the stretching exercises at home, and I advised that she could do the stretches everyday or as tolerated but hand weights no more than every other day. Reviewed exercise prescription with patient. Patient is currently walking at the grocery store at least 30 minutes as her mode of home  exercise at least 1-2 days/week. Patient also stretches some evenings at bedtimes. Patient [facility] [employer] [facility] and is interested in trying chairobics and chair yoga. Encouraged [facility] to [facility] and let me know if she has any questions. Patient was pleased to report that she was able to walk down an incline yesterday without becoming symptomatic. She was also able to sing in church without shortness of breath or other symptoms. Patient completed the cardiac rehab program and made gradual progress with exercise. Patient's functional capacity increased 40% as measured by 6MWT and strength increased 14% as measured by grip strength test. [facility] [place] [facility] [place] using the recumbent stepper 30 minutes, 3 days/week. Patient also plans to walk at the mall on the days she doesn't go to the Y.   Expected Outcomes Progress workloads as tolerated to help increase strength and stamoina and decrease shortness of breath. Continue to progress workloads as tolerated  to help increse endurance to do gardening and other ADLs. Patient will walk 30 minutes, 1-2 days/week in addition to exercise at cardiac rehab. Patient plans to exercise in the [employer] [facility]. Continue to progress workloads as tolerated. Patient will continue exericse 30 minutes 3-5 days/week to maintain health and fitness gains.            Nutrition & Weight - Outcomes:  Pre Biometrics - 01/01/22 1045       Pre Biometrics   Waist Circumference 47.75 inches    Hip Circumference 49.5 inches    Waist to Hip Ratio 0.96 %    Triceps Skinfold 34 mm    % Body Fat 50.5 %    Grip Strength 14 kg    Flexibility --   Not performed due to back issues   Single Leg Stand 2.06 seconds   Pt high fall risk            Post Biometrics - 02/27/22 1113        Post  Biometrics   Height 5' 1"  (1.549 m)    Waist Circumference 48.5 inches    Hip Circumference  50.5 inches    Waist to Hip Ratio 0.96 %    Triceps Skinfold 30 mm    % Body Fat 51.7 %    Grip Strength 16 kg    Single Leg Stand 3 seconds             Nutrition:  Nutrition Therapy & Goals - 02/27/22 1203       Nutrition Therapy   Diet Heart Healthy diet    Drug/Food Interactions Statins/Certain Fruits      Personal Nutrition Goals   Nutrition Goal Patient to implement heart healthy eating patterns with fruits/vegetables, whole grains, low fat dairy, lean protein/plant protein.    Personal Goal #2 Patient to reduce sodium intake to <2371m per day    Personal Goal #3 Patient to identify food sources of refined carbohydrates, saturated fats, trans fats, and sodium    Comments Goals in progress. Patient PYP score improved reflects improved nutrition intake. Improved blood sugar control during exercise since medications reduced; patient will benefit from continued followup with PCP regarding blood sugar. IBS continues to be a barrier to some high fiber foods choices.      Intervention Plan   Intervention Prescribe, educate and counsel regarding individualized specific dietary modifications aiming towards targeted core components such as weight, hypertension, lipid management, diabetes, heart failure and other comorbidities.    Expected Outcomes Short Term Goal: Understand basic principles of dietary content, such as calories, fat, sodium, cholesterol and nutrients.;Long Term Goal: Adherence to prescribed nutrition plan.             Nutrition Discharge:  Nutrition Assessments - 02/20/22 1329       Rate Your Plate Scores   Post Score 66             Education Questionnaire Score:  Knowledge Questionnaire Score - 02/23/22 1647       Knowledge Questionnaire Score   Pre Score 19/24    Post Score 23/24             Goals reviewed with patient; copy given to patient.[facility] completion of  17  exercise and  education sessions. Pt maintained good attendance and progressed nicely during their participation in rehab as evidenced by increased MET level.   Medication list reconciled. Repeat  PHQ score- 0 .  Pt has made significant lifestyle changes and should be commended for their success. [name] achieved their goals during cardiac rehab.   Pt plans to continue exercise at the [facility] in [place]. [name] increased her distance on her post exercise walk test by 384 feet [name] did gain weight towards the end of the program which Dr Johney Frame was aware of. [name] reports feeling stronger. We are proud of [name]'s progress!Harrell Gave RN BSN

## 2022-02-27 NOTE — Progress Notes (Signed)
QUALITY OF LIFE SCORE REVIEW  Pt completed Quality of Life survey as a participant in Cardiac Rehab.  Scores 19.0 or below are considered low. Overall  19.71, Health and Function 17.67, socioeconomic 17.57, physiological and spiritual 27.14, family 25.13. Patient quality of life slightly altered by physical constraints which limits ability to perform as prior to recent cardiac illness. Offered emotional support and reassurance.  Will continue to monitor and intervene as necessary. Patient denies depression.    Albertine Grates RN  02/27/22 1337

## 2022-03-01 ENCOUNTER — Other Ambulatory Visit: Payer: Self-pay | Admitting: Family Medicine

## 2022-03-02 ENCOUNTER — Other Ambulatory Visit: Payer: Self-pay

## 2022-03-02 MED ORDER — CARVEDILOL 3.125 MG PO TABS
3.1250 mg | ORAL_TABLET | Freq: Two times a day (BID) | ORAL | 3 refills | Status: DC
Start: 2022-03-02 — End: 2022-09-07

## 2022-03-02 NOTE — Progress Notes (Unsigned)
Office Visit    Patient Name: Tanya Hogan Date of Encounter: 09/29/2021  PCP:  Hali Marry, MD   [facility]  Cardiologist:  Cristopher Peru, MD  Advanced Practice Provider:  No care team member to display Electrophysiologist:  None    HPI    Tanya Hogan is a 71 y.o. female with a hx of chronic systolic heart failure and CHB status post biventricular PPM, depression, GERD, LBBB, diabetes mellitus without complication, hypertension, hypothyroidism presents today for follow-up.  The patient had breast cancer and was status post meniscectomy.  She has been hospital for CHF exacerbation early 2022.  She had an SVT was placed on amiodarone which she did not tolerate but does tolerate 100 mg daily.  She was last seen in the office April 2022 by Dr. Lovena Le at that time she was not having any angina.  Her left heart cath demonstrated no CAD.  Her EF was 35 to 40% by echocardiogram.  She remained on Coreg and amiodarone.  She had been intolerant to ACE inhibitor.  At that time no medication changes were made and she appeared euvolemic on exam.  Patient was recently in the ED over at [place] on 09/12/2021.  Noted to be in decompensated heart failure with a BNP of 4700 and chest x-ray showing evidence of vascular congestion with bilateral pleural effusions.  She is started on Bumex twice daily with improvement of symptoms.  She initially was requiring 3 L of oxygen was able to be weaned down to room air.  Repeat echocardiogram showed EF of 20%.  Patient achieved euvolemia and was started on spironolactone and Jardiance at discharge.  She was last seen by me early February 2023 and at that time she felt like she was being held down and had no energy. She had felt much better after discontinuing spirolactone. She continued to have weight loss since starting on Ozempic. She has not noticed any significant edema in her legs. She states she does try to maintain a  low-sodium, heart healthy diet however it is challenging because her diet is already restricted with her IBS. She also has many medication intolerances and allergies. She was willing however, to try Coreg at the lowest dose. She was also interested in participating in cardiac rehab as long as her insurance pays for it. We discussed getting her in to see a nutritionist as well which she seemed interested in.  She was last seen 10/13/2021 and continued to have dizziness/lightheadedness.  On Saturday she was walking around [employer] trying to get some steps and and felt fine during and after.  However, on Sunday she was down the entire day and felt dizzy and unwell.  She stated that her back was extremely sore as well and she sees Dr. Lonia Mad for this.  She states she was still having occasional shortness of breath.  Her weight has been stable.  We discussed stopping diltiazem since her EF is still low and this will also help bring up her blood pressure a bit.  Her blood pressure was low normal and I encouraged her to take it at home and record those values.  I asked her to take her blood pressure at the same time every day.  She seemed to be tolerating the Coreg that we started her on last time well.  We started her on the lowest dose 3.169m.   I think she would really benefit from rehab because I think at least  some of her symptoms are deconditioning related.  She sees her primary care tomorrow and will likely get labs at that time.  Recommend a CMP to check liver and kidney function.  The only medication that might have a slight effect on her liver is the amiodarone but she is only on 100 mg a day which is a very low dose.  She also states that she is on BiPAP at night and occasionally wakes up short of breath and has to take her albuterol.  Asked her to discuss this with her primary care since her BiPAP settings may need adjusting.  She was seen by me 10/27/21.  She was seen the previous Friday by Dr. Lovena Le and he  recommended left bundle area pacing. She plans to have the procedure done next week and hopes this will make her feel better. We reviewed her labs and changed her potassium supplement to every other day. She is encouraged to take mucinex and allegra for her nasal congestion and cough, without the decongestive portion. She mentioned that she has been eating lots of fruits and vegetables. She states that she has had some BP numbers that were in the 96E systolic a few days in a row, but they have been better. Today BP was 110/72. Would be hesitant to decrease her diuretics since she has been on that dose of Bumex for a long time and appears euvolemic on exam today. When she misses a dose she had trouble breathing and has increased edema in her lower legs.   She was seen by Dr. Johney Frame 12/22/2021 and has been doing a lot better.  She was reportedly taking Bumex twice a day.  If her ankles become round and puffy she takes an extra dose.  Her weight has been stable ranging from 188 to 193 pounds.  She has been doing intermittent fasting and is now up to 18 hours of fasting in the time.  Increasing her fluid during that time.   Today, she feels okay from cardiovascular standpoint.  Her IBS has been giving her a hard time.  She is still doing intermittent fasting and tries to do 12 hours at a time, then the most she will do is 18 hours.  She has been going to the gym in the mornings on an empty stomach and that seems to work well for her.  Her blood glucose level remains in control when she does this.  She states she has had some weight gain and she attributes this to edema, gas, and stool.  On exam today she did not particularly seem fluid overloaded however, since she occasionally does get lower extremity edema we have provided her with a as needed Bumex prescription.  Reports no shortness of breath nor dyspnea on exertion. Reports no chest pain, pressure, or tightness. No edema, orthopnea, PND. Reports no  palpitations.     Past Medical History    Past Medical History:  Diagnosis Date   Alkaline phosphatase elevation    Asthma    Breast cancer ([place]) 2011   Carpal tunnel syndrome    Carpal tunnel syndrome    bilateral   CHF (congestive heart failure) (HCC)    Cirrhosis, non-alcoholic (HCC)    Closed fracture of unspecified part of radius (alone)    DDD (degenerative disc disease), lumbar    Depression    pt denies   Diabetes mellitus without complication ([place])    Enlarged heart    Fibromyalgia    GERD (gastroesophageal reflux  disease)    History of kidney stones    HTN (hypertension)    Hypothyroidism    IBS (irritable bowel syndrome)    LBBB (left bundle branch block)    Microcytic anemia 05/14/2017   Pneumonia    Presence of permanent cardiac pacemaker    PVD (peripheral vascular disease) ([place])    2019   Sleep apnea    uses cpap   Past Surgical History:  Procedure Laterality Date   BREAST LUMPECTOMY Left 2011   COLONOSCOPY     FOOT SURGERY     KNEE SURGERY     MASTECTOMY Right    05/2019   PACEMAKER GENERATOR CHANGE N/A 08/23/2014   Procedure: PACEMAKER GENERATOR CHANGE;  Surgeon: Evans Lance, MD;  Location[facility] C[facility] LAB;  Service: Cardiovascular;  Laterality: N/A;   PACEMAKER PLACEMENT  2009   [facility]   RECTAL SURGERY  1976   REDUCTION MAMMAPLASTY Left    05/2019   TOTAL SHOULDER ARTHROPLASTY Right 03/10/2018   TOTAL SHOULDER ARTHROPLASTY Right 03/10/2018   Procedure: RIGHT TOTAL SHOULDER ARTHROPLASTY;  Surgeon: [name], MD;  Location: [facility];  Service: Orthopedics;  Laterality: Right;   TUBAL LIGATION      Allergies  Allergies  Allergen Reactions   Ferric Carboxymaltose Shortness Of Breath    Mild SOB following injectafer infusion Mild SOB following injectafer infusion    Penicillins Anaphylaxis    Has patient had a PCN reaction causing immediate rash, facial/tongue/throat swelling, SOB or lightheadedness with hypotension: Yes Has  patient had a PCN reaction causing severe rash involving mucus membranes or skin necrosis: No Has patient had a PCN reaction that required hospitalization: No Has patient had a PCN reaction occurring within the last 10 years: No If all of the above answers are "NO", then may proceed with Cephalosporin use.    Accupril [Quinapril Hcl] Other (See Comments)    Hyperkalemia   Atorvastatin Other (See Comments)    msucle aches   Celebrex [Celecoxib]     ulcers   Fluticasone Other (See Comments)    Nasal ulcer.     Levocetirizine     Doesn't recall   Levomenol Other (See Comments)    unknown unknown    Losartan Potassium Itching   Naproxen Swelling   Nasonex [Mometasone Furoate] Other (See Comments)    nosebleed   Chamomile Other (See Comments)    unknown   Vesicare [Solifenacin] Other (See Comments)    "stopped me from urinating"   Aspirin Other (See Comments)    Pt states "burns holes in stomach", ulcers    Baking Soda-Fluoride [Sodium Fluoride] Itching   Clindamycin/Lincomycin Rash   Codeine Nausea And Vomiting   Furosemide Other (See Comments)    Nose bleed   Lactose    Lactose Intolerance (Gi) Other (See Comments)    Increased calcium, sneezing, nasal issues   Lanolin-Petrolatum Rash   Metformin And Related Diarrhea   Miralax [Polyethylene Glycol 3350]    Miralax [Polyethylene Glycol] Rash   Nasonex [Mometasone Furoate] Other (See Comments)   Quinine Rash   Rifampin Other (See Comments)    DOES NOT REMEMBER THIS MEDICATION OR ALLERGY   Sulfa Antibiotics Rash   Sulfonamide Derivatives Rash   Tramadol Rash    Anxiety   Wellbutrin [Bupropion] Other (See Comments)    Muscle aches, pain, "made my skin crawl"   Wool Alcohol [Lanolin] Rash   Zoledronic Acid Other (See Comments) and Rash    shaking  EKGs/Labs/Other Studies Reviewed:   The following studies were reviewed today:  Echocardiogram 09/21/2021   The study was technically difficult.    Left Ventricle:  Left ventricle is mildly dilated.Systolic function is  severely abnormal. EF: 20 %.    Right Ventricle: Right ventricle is mildly dilated. Systolic function  is moderately reduced. Abnormal tricuspid annular plane systolic excursion  (TAPSE) <1.7 cm.    Left Atrium: Left atrium is mildly dilated.    Mitral Valve: There is mild regurgitation with a centrally directed  jet.    Tricuspid Valve: The right ventricular systolic pressure is mildly  elevated (37-49 mmHg).   EKG:  EKG is not ordered today.   Recent Labs: 04/16/2021: ALT 18; Hemoglobin 12.2; Platelets 299 08/14/2021: TSH 2.54 09/23/2021: BUN 15; Creat 1.12; Potassium 4.2; Sodium 141  Recent Lipid Panel    Component Value Date/Time   CHOL 168 04/16/2021 0000   TRIG 120 04/16/2021 0000   HDL 73 04/16/2021 0000   CHOLHDL 2.3 04/16/2021 0000   VLDL 26 02/18/2017 0939   LDLCALC 74 04/16/2021 0000     Home Medications   Current Meds  Medication Sig   albuterol (PROVENTIL) (2.5 MG/3ML) 0.083% nebulizer solution Take 2.5 mg by nebulization every 6 (six) hours as needed for wheezing or shortness of breath.   albuterol (VENTOLIN HFA) 108 (90 Base) MCG/ACT inhaler Inhale 1-2 puffs into the lungs every 6 (six) hours as needed for wheezing or shortness of breath.   alendronate (FOSAMAX) 70 MG tablet TAKE 1 TABLET BY MOUTH  EVERY 7 DAYS WITH A FULL  GLASS OF WATER ON AN EMPTY  STOMACH   ALLEGRA-D ALLERGY & CONGESTION 60-120 MG 12 hr tablet    AMBULATORY NON FORMULARY MEDICATION Medication Name: Tubing and supplies for nebulizer. Fax - [phone]   amiodarone (PACERONE) 200 MG tablet TAKE ONE-HALF TABLET BY  MOUTH DAILY   B Complex-C (B-COMPLEX WITH VITAMIN C) tablet Take 1 tablet by mouth daily.   bumetanide (BUMEX) 1 MG tablet TAKE 1 TABLET BY MOUTH  TWICE DAILY   Calcium Citrate-Vitamin D (CITRACAL + D PO) Take 1 tablet by mouth daily.   carvedilol (COREG) 3.125 MG tablet Take 1 tablet (3.125 mg total) by mouth 2 (two) times  daily.   Continuous Blood Gluc Receiver (FREESTYLE LIBRE 2 READER) DEVI by Does not apply route.   Continuous Blood Gluc Sensor (FREESTYLE LIBRE 2 SENSOR) MISC by Does not apply route.   diltiazem (CARDIZEM CD) 180 MG 24 hr capsule TAKE 1 CAPSULE BY MOUTH  DAILY   levothyroxine (SYNTHROID) 88 MCG tablet TAKE 1 TABLET(88 MCG) BY MOUTH DAILY   LINZESS 72 MCG capsule Take 72 mcg by mouth 2 (two) times daily.   Multiple Vitamins-Minerals (OCUVITE EYE + MULTI) TABS Take 1 tablet by mouth daily.   pantoprazole (PROTONIX) 40 MG tablet Take 40 mg by mouth daily.   potassium chloride (KLOR-CON M) 10 MEQ tablet Take 1 tablet (10 mEq total) by mouth daily.   pravastatin (PRAVACHOL) 40 MG tablet TAKE 1 TABLET BY MOUTH AT  BEDTIME   Probiotic Product ([employer] PROBIOTIC) CAPS    Semaglutide,0.25 or 0.5MG/DOS, (OZEMPIC, 0.25 OR 0.5 MG/DOSE,) 2 MG/1.5ML SOPN Inject 0.25 mg into the skin.   valACYclovir (VALTREX) 1000 MG tablet Take 1 tablet (1,000 mg total) by mouth 2 (two) times daily.     Review of Systems      All other systems reviewed and are otherwise negative except as noted above.  Physical Exam  Vitals:   03/03/22 0836  BP: 134/80  Pulse: 83  SpO2: 99%       Wt Readings from Last 3 Encounters:  09/29/21 199 lb 14.4 oz (90.7 kg)  09/26/21 201 lb (91.2 kg)  09/23/21 202 lb (91.6 kg)     GEN: Well nourished, well developed, in no acute distress. HEENT: normal. Neck: Supple, no JVD, carotid bruits, or masses. Cardiac: RRR, no murmurs, rubs, or gallops. No clubbing, cyanosis, edema.  Radials/PT 2+ and equal bilaterally.  Respiratory:  Respirations regular and unlabored, clear to auscultation bilaterally. GI: Soft, nontender, nondistended. MS: No deformity or atrophy. Skin: Warm and dry, no rash. Neuro:  Strength and sensation are intact. Psych: Slightly depressed today  Assessment & Plan    Acute on chronic HFrEF -She appears euvolemic on exam today -We will provide as needed  Bumex 1 mg to take as needed for lower extremity edema -She is to take her weight daily and record.  If she gains more than 2 pounds overnight or 5 pounds in a week she can take an extra dose of Bumex -Echocardiogram 5/23 showed EF 20 to 25% with no significant change from her prior echo report -Continue GDMT: Bumex 1 mg twice daily, Ozempic 0.25 mg injection, Coreg 3.121m -She did not tolerate ARB or spironolactone -Heart failure medications have been difficult to add due to a lot of medication intolerances and allergies. -She will need a BMET to evaluate renal function and potassium  COPD -Continue follow-up with your pulmonologist  Hypertension -Today, 134/80 -Continue current medication regimen -Continue to monitor your blood pressure at home   Diabetes mellitus type 2 -Recently started on Ozempic, Jardiance also prescribed -Blood glucose was well controlled on current regimen   NSVT -Amio 1079mdaily -She has not had any recurrent symptoms  Hypothyroidism -Continue to f/u with your PCP  7. SOB at night -BiPAP at night  Cardiac Rehabilitation Eligibility Assessment   Cleared for cardiac rehab  Disposition: Follow up 3 months with Dr. PeJohney Framer APP.   Signed, TeElgie CollardPA-C 10/13/2021 [facility]

## 2022-03-03 ENCOUNTER — Telehealth: Payer: Self-pay | Admitting: *Deleted

## 2022-03-03 ENCOUNTER — Encounter: Payer: Self-pay | Admitting: Physician Assistant

## 2022-03-03 ENCOUNTER — Ambulatory Visit (INDEPENDENT_AMBULATORY_CARE_PROVIDER_SITE_OTHER): Payer: Medicare Other | Admitting: Physician Assistant

## 2022-03-03 ENCOUNTER — Other Ambulatory Visit: Payer: Medicare Other

## 2022-03-03 VITALS — BP 134/80 | HR 83 | Ht 61.0 in | Wt 203.6 lb

## 2022-03-03 DIAGNOSIS — I4729 Other ventricular tachycardia: Secondary | ICD-10-CM | POA: Diagnosis not present

## 2022-03-03 DIAGNOSIS — Z794 Long term (current) use of insulin: Secondary | ICD-10-CM | POA: Diagnosis not present

## 2022-03-03 DIAGNOSIS — I5020 Unspecified systolic (congestive) heart failure: Secondary | ICD-10-CM | POA: Diagnosis not present

## 2022-03-03 DIAGNOSIS — J439 Emphysema, unspecified: Secondary | ICD-10-CM

## 2022-03-03 DIAGNOSIS — E038 Other specified hypothyroidism: Secondary | ICD-10-CM

## 2022-03-03 DIAGNOSIS — I10 Essential (primary) hypertension: Secondary | ICD-10-CM | POA: Diagnosis not present

## 2022-03-03 DIAGNOSIS — R0602 Shortness of breath: Secondary | ICD-10-CM

## 2022-03-03 DIAGNOSIS — E119 Type 2 diabetes mellitus without complications: Secondary | ICD-10-CM | POA: Diagnosis not present

## 2022-03-03 MED ORDER — BUMETANIDE 1 MG PO TABS: 1.0000 mg | ORAL_TABLET | Freq: Two times a day (BID) | ORAL | 3 refills | Status: DC

## 2022-03-03 NOTE — Telephone Encounter (Signed)
Per triage RN, patient called back and stated that she will come today to have her labs drawn. Order is in and she has been added to the lab schedule.

## 2022-03-03 NOTE — Patient Instructions (Addendum)
Medication Instructions:  1.You may take an additional 1 mg tablet of the bumex as needed for lower extremity edema *If you need a refill on your cardiac medications before your next appointment, please call your pharmacy*   Lab Work: None If you have labs (blood work) drawn today and your tests are completely normal, you will receive your results only by: [place] (if you have MyChart) OR A paper copy in the mail If you have any lab test that is abnormal or we need to change your treatment, we will call you to review the results.   Follow-Up: At [facility], you and your health needs are our priority.  As part of our continuing mission to provide you with exceptional heart care, we have created designated Provider Care Teams.  These Care Teams include your primary Cardiologist (physician) and Advanced Practice Providers (APPs -  Physician Assistants and Nurse Practitioners) who all work together to provide you with the care you need, when you need it.  Your next appointment:   06/29/2022 at 9:00 AM  The format for your next appointment:   In Person  Provider:   Freada Bergeron, MD   Important Information About Sugar

## 2022-03-03 NOTE — Addendum Note (Signed)
Addended by: Juventino Slovak on: 03/03/2022 09:55 AM   Modules accepted: Orders

## 2022-03-03 NOTE — Telephone Encounter (Signed)
Called patient to let her know that per Dr Johney Frame she is due to have a BMET drawn. She did not answer so I left her a message to call us back to either have this drawn today or another day that works for her.

## 2022-03-04 DIAGNOSIS — E119 Type 2 diabetes mellitus without complications: Secondary | ICD-10-CM | POA: Diagnosis not present

## 2022-03-04 DIAGNOSIS — M79672 Pain in left foot: Secondary | ICD-10-CM | POA: Diagnosis not present

## 2022-03-04 DIAGNOSIS — L602 Onychogryphosis: Secondary | ICD-10-CM | POA: Diagnosis not present

## 2022-03-04 DIAGNOSIS — L84 Corns and callosities: Secondary | ICD-10-CM | POA: Diagnosis not present

## 2022-03-04 DIAGNOSIS — M79671 Pain in right foot: Secondary | ICD-10-CM | POA: Diagnosis not present

## 2022-03-04 LAB — BASIC METABOLIC PANEL
BUN/Creatinine Ratio: 20 (ref 12–28)
BUN: 18 mg/dL (ref 8–27)
CO2: 26 mmol/L (ref 20–29)
Calcium: 11.1 mg/dL — ABNORMAL HIGH (ref 8.7–10.3)
Chloride: 102 mmol/L (ref 96–106)
Creatinine, Ser: 0.89 mg/dL (ref 0.57–1.00)
Glucose: 97 mg/dL (ref 70–99)
Potassium: 4.9 mmol/L (ref 3.5–5.2)
Sodium: 142 mmol/L (ref 134–144)
eGFR: 69 mL/min/{1.73_m2} (ref >59)

## 2022-03-05 ENCOUNTER — Telehealth: Payer: Self-pay | Admitting: Physician Assistant

## 2022-03-05 NOTE — Telephone Encounter (Signed)
The patient has been notified of the result and verbalized understanding.  All questions (if any) were answered. Bedelia Person Wayman Hoard, RN 03/05/2022 12:48 PM  Pt reports has recently eaten more diary products then normal. Thinks this may be contributing to high Calcium level.  Stopped taking Calcium supplement around 1-2 months ago.

## 2022-03-05 NOTE — Telephone Encounter (Signed)
Patient returned call for lab results.  

## 2022-03-06 ENCOUNTER — Other Ambulatory Visit: Payer: Self-pay | Admitting: *Deleted

## 2022-03-06 DIAGNOSIS — Z79899 Other long term (current) drug therapy: Secondary | ICD-10-CM

## 2022-03-10 DIAGNOSIS — G4733 Obstructive sleep apnea (adult) (pediatric): Secondary | ICD-10-CM | POA: Diagnosis not present

## 2022-03-10 DIAGNOSIS — K7581 Nonalcoholic steatohepatitis (NASH): Secondary | ICD-10-CM | POA: Diagnosis not present

## 2022-03-10 DIAGNOSIS — I5022 Chronic systolic (congestive) heart failure: Secondary | ICD-10-CM | POA: Diagnosis not present

## 2022-03-10 DIAGNOSIS — I4729 Other ventricular tachycardia: Secondary | ICD-10-CM | POA: Diagnosis not present

## 2022-03-17 ENCOUNTER — Other Ambulatory Visit: Payer: Self-pay

## 2022-03-17 MED ORDER — LEVOTHYROXINE SODIUM 88 MCG PO TABS: ORAL_TABLET | ORAL | 1 refills | Status: DC

## 2022-03-20 ENCOUNTER — Telehealth: Payer: Self-pay

## 2022-03-20 NOTE — Telephone Encounter (Signed)
Tanya Hogan called and questioned if she should be on Jardiance. It was discontinued on 11/20/2021. I don't see a reason to discontinue. She she be on or off Jardiance?

## 2022-03-21 ENCOUNTER — Other Ambulatory Visit: Payer: Self-pay | Admitting: Family Medicine

## 2022-03-23 ENCOUNTER — Other Ambulatory Visit: Payer: Self-pay

## 2022-03-23 DIAGNOSIS — E118 Type 2 diabetes mellitus with unspecified complications: Secondary | ICD-10-CM | POA: Diagnosis not present

## 2022-03-23 MED ORDER — LEVOTHYROXINE SODIUM 88 MCG PO TABS: ORAL_TABLET | ORAL | 1 refills | Status: DC

## 2022-03-23 NOTE — Telephone Encounter (Signed)
Was she on it when we did her last A1C. If she was not, then no we don't need to refill it.  Or she can stay off until I see her August/Sept for her 3 mo f/u for diabetes. Her a1c was awesome.

## 2022-03-24 NOTE — Telephone Encounter (Signed)
Left message for a return call

## 2022-03-26 ENCOUNTER — Other Ambulatory Visit: Payer: Self-pay

## 2022-03-26 DIAGNOSIS — K7581 Nonalcoholic steatohepatitis (NASH): Secondary | ICD-10-CM | POA: Diagnosis not present

## 2022-03-26 DIAGNOSIS — E039 Hypothyroidism, unspecified: Secondary | ICD-10-CM | POA: Diagnosis not present

## 2022-03-26 DIAGNOSIS — E118 Type 2 diabetes mellitus with unspecified complications: Secondary | ICD-10-CM | POA: Diagnosis not present

## 2022-03-26 DIAGNOSIS — D0511 Intraductal carcinoma in situ of right breast: Secondary | ICD-10-CM | POA: Diagnosis not present

## 2022-03-26 DIAGNOSIS — E278 Other specified disorders of adrenal gland: Secondary | ICD-10-CM | POA: Diagnosis not present

## 2022-03-26 DIAGNOSIS — I509 Heart failure, unspecified: Secondary | ICD-10-CM | POA: Diagnosis not present

## 2022-03-26 DIAGNOSIS — M8000XA Age-related osteoporosis with current pathological fracture, unspecified site, initial encounter for fracture: Secondary | ICD-10-CM | POA: Diagnosis not present

## 2022-03-26 DIAGNOSIS — J438 Other emphysema: Secondary | ICD-10-CM | POA: Diagnosis not present

## 2022-03-26 DIAGNOSIS — Z950 Presence of cardiac pacemaker: Secondary | ICD-10-CM | POA: Diagnosis not present

## 2022-03-26 DIAGNOSIS — D649 Anemia, unspecified: Secondary | ICD-10-CM | POA: Diagnosis not present

## 2022-04-01 DIAGNOSIS — K7401 Hepatic fibrosis, early fibrosis: Secondary | ICD-10-CM | POA: Diagnosis not present

## 2022-04-01 DIAGNOSIS — I2510 Atherosclerotic heart disease of native coronary artery without angina pectoris: Secondary | ICD-10-CM | POA: Diagnosis not present

## 2022-04-01 DIAGNOSIS — K7581 Nonalcoholic steatohepatitis (NASH): Secondary | ICD-10-CM | POA: Diagnosis not present

## 2022-04-01 DIAGNOSIS — K581 Irritable bowel syndrome with constipation: Secondary | ICD-10-CM | POA: Diagnosis not present

## 2022-04-06 ENCOUNTER — Other Ambulatory Visit: Payer: Medicare Other

## 2022-04-06 DIAGNOSIS — D61818 Other pancytopenia: Secondary | ICD-10-CM | POA: Diagnosis not present

## 2022-04-06 DIAGNOSIS — Z79899 Other long term (current) drug therapy: Secondary | ICD-10-CM | POA: Diagnosis not present

## 2022-04-06 LAB — COMPREHENSIVE METABOLIC PANEL
ALT: 22 IU/L (ref 0–32)
AST: 21 IU/L (ref 0–40)
Albumin/Globulin Ratio: 2 (ref 1.2–2.2)
Albumin: 4.7 g/dL (ref 3.8–4.8)
Alkaline Phosphatase: 125 IU/L — ABNORMAL HIGH (ref 44–121)
BUN/Creatinine Ratio: 14 (ref 12–28)
BUN: 13 mg/dL (ref 8–27)
Bilirubin Total: 0.5 mg/dL (ref 0.0–1.2)
CO2: 27 mmol/L (ref 20–29)
Calcium: 9.8 mg/dL (ref 8.7–10.3)
Chloride: 100 mmol/L (ref 96–106)
Creatinine, Ser: 0.94 mg/dL (ref 0.57–1.00)
Globulin, Total: 2.4 g/dL (ref 1.5–4.5)
Glucose: 100 mg/dL — ABNORMAL HIGH (ref 70–99)
Potassium: 5.3 mmol/L — ABNORMAL HIGH (ref 3.5–5.2)
Sodium: 138 mmol/L (ref 134–144)
Total Protein: 7.1 g/dL (ref 6.0–8.5)
eGFR: 64 mL/min/{1.73_m2} (ref >59)

## 2022-04-08 ENCOUNTER — Telehealth: Payer: Self-pay | Admitting: Family Medicine

## 2022-04-08 ENCOUNTER — Telehealth (HOSPITAL_BASED_OUTPATIENT_CLINIC_OR_DEPARTMENT_OTHER): Payer: Self-pay | Admitting: Cardiology

## 2022-04-08 ENCOUNTER — Encounter: Payer: Self-pay | Admitting: Family Medicine

## 2022-04-08 ENCOUNTER — Ambulatory Visit (INDEPENDENT_AMBULATORY_CARE_PROVIDER_SITE_OTHER): Payer: Medicare Other | Admitting: Family Medicine

## 2022-04-08 VITALS — BP 133/68 | HR 89 | Ht 61.0 in | Wt 207.0 lb

## 2022-04-08 DIAGNOSIS — F4329 Adjustment disorder with other symptoms: Secondary | ICD-10-CM | POA: Diagnosis not present

## 2022-04-08 DIAGNOSIS — I5022 Chronic systolic (congestive) heart failure: Secondary | ICD-10-CM

## 2022-04-08 DIAGNOSIS — I10 Essential (primary) hypertension: Secondary | ICD-10-CM | POA: Diagnosis not present

## 2022-04-08 DIAGNOSIS — E118 Type 2 diabetes mellitus with unspecified complications: Secondary | ICD-10-CM

## 2022-04-08 LAB — POCT GLYCOSYLATED HEMOGLOBIN (HGB A1C): HbA1c POC (<> result, manual entry): 5.5 % (ref 4.0–5.6)

## 2022-04-08 NOTE — Telephone Encounter (Signed)
Pt called to confirm her dose of coreg we have her taking.  Pt aware that she should be taking coreg 3.125 mg po bid, with last refill sent on 03/02/22. Pt verbalized understanding and agrees with this plan.  Pt states she is taking this dose.

## 2022-04-08 NOTE — Assessment & Plan Note (Signed)
A1c looks absolutely phenomenal today.  She has had a couple of blood sugars in the 90s but nothing much lower than that.  She really is doing great.

## 2022-04-08 NOTE — Telephone Encounter (Signed)
Please call patient to her know that her medications are in the refrigerator.  We meant to remind her while she came in for her appointment today but we forgot.

## 2022-04-08 NOTE — Telephone Encounter (Signed)
Pt c/o medication issue:  1. Name of Medication:   carvedilol (COREG) 3.125 MG tablet  2. How are you currently taking this medication (dosage and times per day)?  As prescribed  3. Are you having a reaction (difficulty breathing--STAT)?  No  4. What is your medication issue?   Patient called wanting to get clarification of this medication and it's side effects.

## 2022-04-08 NOTE — Telephone Encounter (Signed)
error 

## 2022-04-08 NOTE — Progress Notes (Addendum)
Established Patient Office Visit  Subjective   Patient ID: Tanya Hogan, female    DOB: 1951-01-08  Age: 71 y.o. MRN: [id number]  Chief Complaint  Patient presents with   Follow-up    HPI  Diabetes - no hypoglycemic events. No wounds or sores that are not healing well. No increased thirst or urination. Checking glucose at home. Taking medications as prescribed without any side effects.  She has been doing really well with her weekly injections.  She really has not had any problems or complications and she has been able to completely come off of her insulin and pills.  She is up to 0.5 mg on her Ozempic. She is using her CGM and found it incredibly useful.    Hypertension-she did bring in her home blood pressure log and that look absolutely fantastic.  She has been gaining weight despite being on the Ozempic.  She said she did take her fluid pill about 3 times last week and did drop a couple of pounds with the medication but just feels like she is actually gaining weight overall.  She did want to let me know that she was having difficulty getting her thyroid medication from the mail order.  She does have the medication in because we ended up calling in a shorter supply to her local pharmacy and she says she will try again.  She saw GI recently and they added Bentyl to her regimen but she says that it was actually causing her to feel constipated like she could not move her bowels and so she has stopped it.  She also was told to discontinue her spironolactone at least short-term because of hyperkalemia.  She still has potassium on her medication list but says he had quit taking that months ago and was off of it when she had the recent elevated potassium level.  He also reports that she has had some increased nasal mucus and sputum for the last 3 month or more.  It seems to have gotten worse in the last couple of days she says she is getting up a little bit of yellow-colored mucus with it.   Did speak with the pulmonologist about it.  She says they did discontinue her Advair because she felt like it really was not helping so she has just been using her Spiriva, albuterol twice a day, and nasal saline rinse daily.  She has been doing well with cardiac rehab and also doing some exercises at the [facility].     ROS    Objective:     BP 133/68   Pulse 89   Ht 5' 1"  (1.549 m)   Wt 207 lb (93.9 kg)   SpO2 99%   BMI 39.11 kg/m    Physical Exam Vitals and nursing note reviewed.  Constitutional:      Appearance: She is well-developed.  HENT:     Head: Normocephalic and atraumatic.  Cardiovascular:     Rate and Rhythm: Normal rate and regular rhythm.     Heart sounds: Normal heart sounds.  Pulmonary:     Effort: Pulmonary effort is normal.     Breath sounds: Normal breath sounds.  Skin:    General: Skin is warm and dry.  Neurological:     Mental Status: She is alert and oriented to person, place, and time.  Psychiatric:        Behavior: Behavior normal.      Results for orders placed or performed in visit on 04/08/22  POCT HgB A1C  Result Value Ref Range   Hemoglobin A1C     HbA1c POC (<> result, manual entry) 5.5 4.0 - 5.6 %   HbA1c, POC (prediabetic range)     HbA1c, POC (controlled diabetic range)        The 10-year ASCVD risk score (Arnett DK, et al., 2019) is: 28.3%    Assessment & Plan:   Problem List Items Addressed This Visit       Cardiovascular and Mediastinum   Chronic systolic heart failure (HCC)    Currently off of spironolactone because of elevated potassium levels.  Recommend hold the medication we may try to do a retrial in the near future.  Intolerant to ACEs and ARB's as well as beta-blockers.      Benign essential hypertension    Blood pressure today at home blood pressures look absolutely fantastic most of her home blood pressures are actually less than 130.        Endocrine   Controlled diabetes mellitus type 2 with  complications (HCC) - Primary    A1c looks absolutely phenomenal today.  She has had a couple of blood sugars in the 90s but nothing much lower than that.  She really is doing great.      Relevant Orders   POCT HgB A1C (Completed)     Other   Stress and adjustment reaction    He is interested in talking with a therapist or counselor she has had a lot going on over the last several years and does not always feel supported by her family.  She is also engaged in a lot of caretaking for her brother who lives with her.  We will place referral for therapy/counseling.      Relevant Orders   Ambulatory referral to Behavioral Health    Diabetic Foot Exam - Simple   Simple Foot Form Diabetic Foot exam was performed with the following findings: Yes 04/08/2022 11:31 AM  Visual Inspection No deformities, no ulcerations, no other skin breakdown bilaterally: Yes Sensation Testing Intact to touch and monofilament testing bilaterally: Yes Pulse Check Posterior Tibialis and Dorsalis pulse intact bilaterally: Yes Comments    Some deformity of the distal foot bilaterally and callous formation    Return in about 4 months (around 08/08/2022) for Diabetes follow-up.   I spent 42 minutes on the day of the encounter to include pre-visit record review, face-to-face time with the patient and post visit ordering of test.   Beatrice Lecher, MD

## 2022-04-08 NOTE — Telephone Encounter (Signed)
Task completed. Patient will stop by on 04/09/22 to pick up her medications.

## 2022-04-08 NOTE — Assessment & Plan Note (Signed)
Blood pressure today at home blood pressures look absolutely fantastic most of her home blood pressures are actually less than 130.

## 2022-04-08 NOTE — Assessment & Plan Note (Signed)
Currently off of spironolactone because of elevated potassium levels.  Recommend hold the medication we may try to do a retrial in the near future.  Intolerant to ACEs and ARB's as well as beta-blockers.

## 2022-04-08 NOTE — Assessment & Plan Note (Signed)
He is interested in talking with a therapist or counselor she has had a lot going on over the last several years and does not always feel supported by her family.  She is also engaged in a lot of caretaking for her brother who lives with her.  We will place referral for therapy/counseling.

## 2022-04-15 ENCOUNTER — Encounter: Payer: Self-pay | Admitting: General Practice

## 2022-04-23 DIAGNOSIS — E118 Type 2 diabetes mellitus with unspecified complications: Secondary | ICD-10-CM | POA: Diagnosis not present

## 2022-04-29 DIAGNOSIS — C50911 Malignant neoplasm of unspecified site of right female breast: Secondary | ICD-10-CM | POA: Diagnosis not present

## 2022-05-05 ENCOUNTER — Ambulatory Visit (INDEPENDENT_AMBULATORY_CARE_PROVIDER_SITE_OTHER): Payer: Medicare Other

## 2022-05-05 DIAGNOSIS — I442 Atrioventricular block, complete: Secondary | ICD-10-CM | POA: Diagnosis not present

## 2022-05-05 LAB — CUP PACEART REMOTE DEVICE CHECK
Battery Remaining Longevity: 97 mo
Battery Voltage: 3.07 V
Brady Statistic AP VP Percent: 57.11 %
Brady Statistic AP VS Percent: 0.13 %
Brady Statistic AS VP Percent: 40.68 %
Brady Statistic AS VS Percent: 2.07 %
Brady Statistic RA Percent Paced: 58.62 %
Brady Statistic RV Percent Paced: 97.8 %
Date Time Interrogation Session: 20230911222742
Implantable Lead Implant Date: 20030324
Implantable Lead Implant Date: 20030324
Implantable Lead Implant Date: 20230313
Implantable Lead Location: 753858
Implantable Lead Location: 753859
Implantable Lead Location: 753860
Implantable Lead Model: 3830
Implantable Lead Model: 4068
Implantable Lead Model: 4193
Implantable Pulse Generator Implant Date: 20230313
Lead Channel Impedance Value: 1007 Ohm
Lead Channel Impedance Value: 3306 Ohm
Lead Channel Impedance Value: 3306 Ohm
Lead Channel Impedance Value: 3306 Ohm
Lead Channel Impedance Value: 361 Ohm
Lead Channel Impedance Value: 399 Ohm
Lead Channel Impedance Value: 494 Ohm
Lead Channel Impedance Value: 532 Ohm
Lead Channel Impedance Value: 874 Ohm
Lead Channel Pacing Threshold Amplitude: 0.625 V
Lead Channel Pacing Threshold Amplitude: 2 V
Lead Channel Pacing Threshold Pulse Width: 0.4 ms
Lead Channel Pacing Threshold Pulse Width: 0.8 ms
Lead Channel Sensing Intrinsic Amplitude: 1.875 mV
Lead Channel Sensing Intrinsic Amplitude: 1.875 mV
Lead Channel Sensing Intrinsic Amplitude: 13.625 mV
Lead Channel Sensing Intrinsic Amplitude: 13.625 mV
Lead Channel Setting Pacing Amplitude: 2 V
Lead Channel Setting Pacing Amplitude: 2.5 V
Lead Channel Setting Pacing Amplitude: 2.5 V
Lead Channel Setting Pacing Pulse Width: 0.4 ms
Lead Channel Setting Pacing Pulse Width: 0.8 ms
Lead Channel Setting Sensing Sensitivity: 2.8 mV

## 2022-05-05 NOTE — Telephone Encounter (Signed)
Patient advised.

## 2022-05-06 DIAGNOSIS — L84 Corns and callosities: Secondary | ICD-10-CM | POA: Diagnosis not present

## 2022-05-06 DIAGNOSIS — E119 Type 2 diabetes mellitus without complications: Secondary | ICD-10-CM | POA: Diagnosis not present

## 2022-05-06 DIAGNOSIS — L602 Onychogryphosis: Secondary | ICD-10-CM | POA: Diagnosis not present

## 2022-05-06 DIAGNOSIS — M79672 Pain in left foot: Secondary | ICD-10-CM | POA: Diagnosis not present

## 2022-05-06 DIAGNOSIS — M79671 Pain in right foot: Secondary | ICD-10-CM | POA: Diagnosis not present

## 2022-05-07 ENCOUNTER — Ambulatory Visit (INDEPENDENT_AMBULATORY_CARE_PROVIDER_SITE_OTHER): Payer: Medicare Other | Admitting: Pharmacist

## 2022-05-07 ENCOUNTER — Telehealth: Payer: Self-pay | Admitting: Cardiology

## 2022-05-07 DIAGNOSIS — E1169 Type 2 diabetes mellitus with other specified complication: Secondary | ICD-10-CM

## 2022-05-07 DIAGNOSIS — I10 Essential (primary) hypertension: Secondary | ICD-10-CM

## 2022-05-07 DIAGNOSIS — E118 Type 2 diabetes mellitus with unspecified complications: Secondary | ICD-10-CM

## 2022-05-07 NOTE — Progress Notes (Signed)
Chronic Care Management Pharmacy Note  05/08/2022 Name:  Tanya Hogan MRN:  [id number] DOB:  March 07, 1951  Summary: addressed HTN, HLD, and primarily DM.   She provides an overall health update:  - GI: Stomach still acting up, but states she has found a good balance on 2 tablets of her linzess - Pulmonology - working on inhalers. Patient describes feeling as though her troubles are either sinuses, allergies, or infection.  - Fatigue, exhaustion, weight gain - working with cardiology & has appointment end of month.   BG via sensors: highest 137, others usually 115-120s.  Recommendations/Changes made from today's visit:  - No medication changes, agree with current plan and specialist workups   - Will provide ongoing support for 2024 renewal for ozempic patient assistance.  Plan: f/u with pharmacist in 1-2 months  Subjective: Tanya Hogan is an 71 y.o. year old female who is a primary patient of [name], Rene Kocher, MD.  The CCM team was consulted for assistance with disease management and care coordination needs.    Engaged with patient by telephone for follow up visit in response to provider referral for pharmacy case management and/or care coordination services.   Consent to Services:  The patient was given information about Chronic Care Management services, agreed to services, and gave verbal consent prior to initiation of services.  Please see initial visit note for detailed documentation.   Patient Care Team: Hali Marry, MD as PCP - General (Family Medicine) Freada Bergeron, MD as PCP - Cardiology (Cardiology) Marcy Panning, MD (Hematology and Oncology) Lelon Perla, MD as Attending Physician (Cardiology) Evans Lance, MD (Cardiology) Dr. Caprice Kluver (Otolaryngology) Amada Kingfisher, MD as Consulting Physician (Hematology and Oncology) Gerre Pebbles, MD as Referring Physician (Gastroenterology) Haynes Dage, OD as Referring Physician  (Optometry) Darius Bump, [facility] as Pharmacist (Pharmacist)   Objective:  Lab Results  Component Value Date   CREATININE 0.94 04/06/2022   CREATININE 0.89 03/03/2022   CREATININE 1.02 (H) 02/13/2022    Lab Results  Component Value Date   HGBA1C 5.5 04/08/2022   Last diabetic Eye exam:  Lab Results  Component Value Date/Time   HMDIABEYEEXA No Retinopathy 06/11/2021 12:00 AM    Last diabetic Foot exam: No results found for: "HMDIABFOOTEX"      Component Value Date/Time   CHOL 168 04/16/2021 0000   TRIG 120 04/16/2021 0000   HDL 73 04/16/2021 0000   CHOLHDL 2.3 04/16/2021 0000   VLDL 26 02/18/2017 0939   LDLCALC 74 04/16/2021 0000       Latest Ref Rng & Units 04/06/2022   11:00 AM 02/13/2022    4:01 PM 04/16/2021   12:00 AM  Hepatic Function  Total Protein 6.0 - 8.5 g/dL 7.1  7.5  7.1   Albumin 3.8 - 4.8 g/dL 4.7  4.0    AST 0 - 40 IU/L _0 ALT 0 - 32 IU/L _1 Alk Phosphatase 44 - 121 IU/L 125  110    Total Bilirubin 0.0 - 1.2 mg/dL 0.5  0.5  0.4     Lab Results  Component Value Date/Time   TSH 0.93 02/16/2022 01:43 PM   TSH 2.54 08/14/2021 12:00 AM       Latest Ref Rng & Units 02/13/2022    4:01 PM 10/24/2021   10:45 AM 04/16/2021   12:00 AM  CBC  WBC 4.0 - 10.5 K/uL 8.0  6.1  5.4   Hemoglobin 12.0 - 15.0 g/dL 15.1  15.1  12.2   Hematocrit 36.0 - 46.0 % 45.0  46.6  38.0   Platelets 150 - 400 K/uL 253  243  299     Lab Results  Component Value Date/Time   VD25OH 65 05/04/2014 02:27 PM   VD25OH 61 12/29/2010 08:05 AM    Clinical ASCVD: Yes  The 10-year ASCVD risk score (Arnett DK, et al., 2019) is: 31.6%   Values used to calculate the score:     Age: 71 years     Sex: Female     Is Non-Hispanic African American: No     Diabetic: Yes     Tobacco smoker: No     Systolic Blood Pressure: 951 mmHg     Is BP treated: Yes     HDL Cholesterol: 73 mg/dL     Total Cholesterol: 178 mg/dL      Social History   Tobacco Use  Smoking  Status Former   Packs/day: 0.00   Years: 0.00   Total pack years: 0.00   Types: Cigarettes   Quit date: 08/24/1966   Years since quitting: 55.7  Smokeless Tobacco Never   BP Readings from Last 3 Encounters:  04/08/22 133/68  03/03/22 134/80  02/27/22 110/80   Pulse Readings from Last 3 Encounters:  04/08/22 89  03/03/22 83  02/27/22 68   Wt Readings from Last 3 Encounters:  04/08/22 207 lb (93.9 kg)  03/03/22 203 lb 9.6 oz (92.4 kg)  02/27/22 210 lb 1.6 oz (95.3 kg)    Assessment: Review of patient past medical history, allergies, medications, health status, including review of consultants reports, laboratory and other test data, was performed as part of comprehensive evaluation and provision of chronic care management services.   SDOH:  (Social Determinants of Health) assessments and interventions performed:  SDOH Interventions    Flowsheet Row Office Visit from 02/25/2021 in [place] Interventions Intervention Not Indicated  Housing Interventions Intervention Not Indicated  Transportation Interventions Intervention Not Indicated  Financial Strain Interventions Intervention Not Indicated  Physical Activity Interventions Intervention Not Indicated  Stress Interventions Intervention Not Indicated  Social Connections Interventions Intervention Not Indicated       CCM Care Plan  Allergies  Allergen Reactions   Injectafer [Ferric Carboxymaltose] Shortness Of Breath    Mild SOB following injectafer infusion Mild SOB following injectafer infusion    Penicillins Anaphylaxis and Rash    Has patient had a PCN reaction causing immediate rash, facial/tongue/throat swelling, SOB or lightheadedness with hypotension: Yes Has patient had a PCN reaction causing severe rash involving mucus membranes or skin necrosis: No Has patient had a PCN reaction that required hospitalization: No Has patient had a PCN  reaction occurring within the last 10 years: No If all of the above answers are "NO", then may proceed with Cephalosporin use. "Stuttered with medication"     Accupril [Quinapril Hcl] Other (See Comments)    Hyperkalemia=high potassium level   Aleve [Naproxen] Swelling   Celebrex [Celecoxib]     ulcers   Cozaar [Losartan Potassium] Itching   Flonase [Fluticasone] Other (See Comments)    Nasal ulcer.     Levomenol Other (See Comments)    unknown unknown    Lipitor [Atorvastatin] Other (See Comments)    muscle aches/pains   Nasonex [Mometasone Furoate] Other (See Comments)    nosebleed   Xyzal [Levocetirizine]  Other (See Comments)    Doesn't recall reaction type   Chamomile Other (See Comments)    unknown   Vesicare [Solifenacin] Other (See Comments)    "stopped me from urinating"   Aspirin Other (See Comments)    Pt states "burns holes in stomach", ulcers    Baking Soda-Fluoride [Sodium Fluoride] Itching and Rash   Clindamycin/Lincomycin Rash   Codeine Nausea And Vomiting   Furosemide Other (See Comments)    Nose bleed   Lactose Intolerance (Gi) Other (See Comments)    Increased calcium, sneezing, nasal issues   Lanolin-Petrolatum Rash   Metformin And Related Diarrhea   Miralax [Polyethylene Glycol] Rash   Quinine Rash   Reclast [Zoledronic Acid] Rash and Other (See Comments)    shaking   Rifampin Other (See Comments)    DOES NOT REMEMBER THIS MEDICATION OR ALLERGY   Sulfa Antibiotics Rash   Sulfonamide Derivatives Rash   Tramadol Rash    Anxiety   Wellbutrin [Bupropion] Other (See Comments)    Muscle aches, pain, "made my skin crawl"   Wool Alcohol [Lanolin] Rash    Medications Reviewed Today     Reviewed by Darius Bump, [facility] (Pharmacist) on 05/07/22 at 1442  Med List Status: <None>   Medication Order Taking? Sig Documenting Provider Last Dose Status Informant  acetaminophen (TYLENOL) 500 MG tablet [id number] Yes Take 500-1,000 mg by mouth every 6 (six)  hours as needed (for pain.). [provider] Taking Active Self  albuterol (PROVENTIL) (2.5 MG/3ML) 0.083% nebulizer solution [id number] Yes Take 2.5 mg by nebulization every 6 (six) hours as needed for wheezing or shortness of breath. [provider] Taking Active Self  albuterol (VENTOLIN HFA) 108 (90 Base) MCG/ACT inhaler [id number] Yes Inhale 1-2 puffs into the lungs every 6 (six) hours as needed for wheezing or shortness of breath. Hali Marry, MD Taking Active Self  alendronate (FOSAMAX) 70 MG tablet [id number] Yes TAKE 1 TABLET BY MOUTH  EVERY 7 DAYS WITH A FULL  GLASS OF WATER ON AN EMPTY  STOMACH Hali Marry, MD Taking Active Self  Camillo Flaming MEDICATION [id number] Yes Medication Name: Tubing and supplies for nebulizer. Fax - [phone] Hali Marry, MD Taking Active Self  amiodarone (PACERONE) 200 MG tablet [id number] Yes TAKE ONE-HALF TABLET BY MOUTH  DAILY Evans Lance, MD Taking Active   B Complex-C (B-COMPLEX WITH VITAMIN C) tablet [id number] Yes Take 1 tablet by mouth in the morning. [provider] Taking Active Self  bumetanide (BUMEX) 1 MG tablet [id number] Yes Take 1 tablet (1 mg total) by mouth 2 (two) times daily. You may take an additional tablet (1 mg) as needed for lower extremity edema Harriet Pho, Tessa N, PA-C Taking Active   carvedilol (COREG) 3.125 MG tablet [id number] Yes Take 1 tablet (3.125 mg total) by mouth 2 (two) times daily. Freada Bergeron, MD Taking Active   Continuous Blood Gluc Receiver (FREESTYLE LIBRE 2 READER) DEVI [id number] Yes by Does not apply route. [provider] Taking Active Self  Continuous Blood Gluc Sensor (FREESTYLE LIBRE 2 SENSOR) MISC [id number] Yes by Does not apply route. [provider] Taking Active Self  EPINEPHRINE 0.3 mg/0.3 mL IJ SOAJ injection [id number] Yes INJECT 1 PEN IN THE MUSCLE ONE TIME AS DIRECTED FOR ANAPHYLAXIS. PLEASE CALL 911 AFTERWARDS Hali Marry, MD Taking Active   fexofenadine [facility]) 180 MG tablet [id number] Yes Take 180 mg by mouth in the morning. [provider] Taking Active Self  fluticasone-salmeterol (ADVAIR HFA) 115-21 MCG/ACT inhaler [id number] Yes Inhale 2 puffs into the lungs 2 (two) times daily. [provider] Taking Active Self  levothyroxine (SYNTHROID) 88 MCG tablet [id number] Yes TAKE 1 TABLET(88 MCG) BY MOUTH DAILY Hali Marry, MD Taking Active   LINZESS 72 MCG capsule [id number] Yes Take 72-144 mcg by mouth 4 (four) times daily as needed (constipation.). [provider] Taking Active Self           Med Note Louretta Shorten, APRIL   Mon Oct 13, 2021 11:02 AM)    montelukast (SINGULAIR) 10 MG tablet [id number] Yes Take by mouth. [provider] Taking Active   Multiple Vitamins-Minerals (OCUVITE EYE + MULTI) TABS [id number] Yes Take 1 tablet by mouth in the morning. [provider] Taking Active Self  pantoprazole (PROTONIX) 40 MG tablet [id number] Yes Take 1 tablet (40 mg total) by mouth daily before breakfast. Hali Marry, MD Taking Active   PAPAYA ENZYME PO [id number] Yes Take 1 capsule by mouth in the morning. [provider] Taking Active Self  pravastatin (PRAVACHOL) 40 MG tablet [id number] Yes TAKE 1 TABLET BY MOUTH AT  BEDTIME Hali Marry, MD Taking Active Self  Probiotic Product ([employer] PROBIOTIC) CAPS [id number] Yes Take 2 capsules by mouth in the morning. [provider] Taking Active Self  Semaglutide,0.25 or 0.5MG/DOS, (OZEMPIC, 0.25 OR 0.5 MG/DOSE,) 2 MG/1.5ML SOPN [id number] Yes Inject 0.25 mg into the skin every Saturday. [provider] Taking Active Self           Med Note Louretta Shorten, APRIL   Mon Oct 13, 2021 11:02 AM)    Tiotropium Bromide Monohydrate (SPIRIVA RESPIMAT) 1.25 MCG/ACT AERS [id number] Yes Inhale 2 puffs into the lungs daily. [provider] Taking Active Self  valACYclovir (VALTREX) 1000  MG tablet [id number] Yes Take 1 tablet (1,000 mg total) by mouth 2 (two) times daily.  Patient taking differently: Take 1,000 mg by mouth 2 (two) times daily as needed (fever blisters/cold sores.).   Hali Marry, MD Taking Active Self            Patient Active Problem List   Diagnosis Date Noted   Stress and adjustment reaction 04/08/2022   Gastroesophageal reflux disease 02/16/2022   Complete heart block ([place]) 10/24/2021   Osteoporosis 02/28/2021   Primary osteoarthritis of left knee 01/14/2021   Irritable bowel syndrome with constipation 09/16/2020   Aortic atherosclerosis ([place]) 06/11/2020   Benign lipomatous neoplasm of skin and subcutaneous tissue of left leg 06/11/2020   Paroxysmal VT ([place]) 02/14/2020   Chronic constipation 10/06/2019   Moderate persistent asthma with exacerbation 08/29/2019   Genetic testing 08/14/2019   Ductal carcinoma in situ (DCIS) of right breast 06/15/2019   Arthralgia 04/11/2019   Malignant tumor of breast ([place]) 03/06/2019   Fatigue 03/06/2019   Status post total shoulder arthroplasty, right 03/10/2018   DDD (degenerative disc disease), cervical 11/19/2017   Peripheral edema 05/19/2017   Microcytic anemia 05/14/2017   Delayed sleep phase syndrome 10/29/2016   Primary osteoarthritis of left ankle 09/24/2016   Gastrointestinal food sensitivity 05/21/2016   History of arthroplasty of right shoulder 02/24/2016   Bilateral foot pain 01/22/2016   NASH (nonalcoholic steatohepatitis) 05/26/2015    Class: Stage 3   Nasal septal perforation 07/11/2014   Controlled diabetes mellitus type 2 with complications ([place]) 50/27/7412   Physical deconditioning 04/24/2014   Obesity, Class II, BMI 35-39.9, with comorbidity 06/09/2013   OSA (obstructive sleep apnea) 87/86/7672   Chronic systolic  heart failure ([place]) 05/23/2013   Asthma, moderate persistent 04/18/2013   Chronic cough 04/18/2013   Breast cancer of upper-inner quadrant of left female breast  ([place]) 03/18/2010   Hyperlipidemia 11/07/2009   Fibromyalgia 10/29/2009   Biventricular cardiac pacemaker in situ 10/23/2009   ALKALINE PHOSPHATASE, ELEVATED 10/13/2009   Hypothyroidism 10/03/2009   Benign essential hypertension 07/02/2009   Chronic back pain 07/02/2009    Immunization History  Administered Date(s) Administered   Fluad Quad(high Dose 65+) 04/11/2019, 05/02/2020, 04/18/2021, 04/24/2021   Influenza Split 05/10/2012, 05/04/2014   Influenza Whole 06/14/2009, 05/08/2010, 04/24/2011   Influenza, High Dose Seasonal PF 05/17/2017, 05/24/2018, 04/11/2019, 05/02/2020   Influenza,inj,Quad PF,6+ Mos 04/28/2013, 05/04/2014, 05/06/2015, 05/18/2016   Moderna SARS-COV2 Booster Vaccination 10/29/2021   Moderna Sars-Covid-2 Vaccination 11/01/2019, 11/29/2019, 07/19/2020   Pneumococcal Conjugate-13 05/25/2014   Pneumococcal Polysaccharide-23 11/28/2012, 12/28/2018   Td 08/24/2004, 03/07/2010   Tdap 07/14/2012   Zoster Recombinat (Shingrix) 10/06/2017, 01/16/2020   Zoster, Live 04/24/2011    Conditions to be addressed/monitored: HTN, HLD, and DMII  Care Plan : Medication Management  Updates made by Darius Bump, [place] since 05/08/2022 12:00 AM     Problem: DM, HTN, HLD      Long-Range Goal: Disease Progression Prevention   Start Date: 05/21/2021  Recent Progress: On track  Priority: High  Note:   Current Barriers:  Unable to independently afford treatment regimen  Pharmacist Clinical Goal(s):  Over the next 90 days, patient will verbalize ability to afford treatment regimen adhere to plan to optimize therapeutic regimen for diabetes as evidenced by report of adherence to recommended medication management changes through collaboration with PharmD and provider.   Interventions: 1:1 collaboration with Hali Marry, MD regarding development and update of comprehensive plan of care as evidenced by provider attestation and co-signature Inter-disciplinary care team  collaboration (see longitudinal plan of care) Comprehensive medication review performed; medication list updated in electronic medical record  Diabetes:  Controlled; current treatment: ozempic 0.25 mg weekly, jardiance 6m daily  Current glucose readings: per CGM mostly 110-130s Previously: Average BG last 7 days= 128, last 14 days = 123, last 30 days = 124.  Denies hypoglycemic/hyperglycemic symptoms  Current meal patterns: to be discussed at future visits  Current exercise: to be discussed at future visits  Counseled on goal BG values, answered pt questions about CGM vs traditional glucometer Recommend patient continue  current regimen  Hypertension:   Controlled; current treatment: coreg 3.1219mBID ;   Current home readings: states is "controlled" at home  Denies hypotensive/hypertensive symptoms  Recommended continue current regimen, and  Hyperlipidemia:  Controlled; current treatment:pravastatin 4058maily;   Recommended continue current regimen  Patient Goals/Self-Care Activities Over the next 60 days, patient will:  take medications as prescribed  Follow Up Plan: Telephone follow up appointment with care management team member scheduled for: 1-2 months         Medication Assistance:  Pending  [facility]  [id number] [address] [place] [place]one: [phone]: [phone] ([facility] [employer]one: [phone]: [phone] [place]43154008[place] [id number]: [phone]: [phone] ([facility]) -  [place] [place]  [address] Phone: [phone] Fax: [phone]   Uses pill box? Yes Pt endorses 100% compliance  Follow Up:  Patient agrees to Care Plan and Follow-up.  Plan: Telephone follow up appointment with care management team member scheduled for:  1-2 month  Larinda Buttery, PharmD Clinical Pharmacist [facility] [phone]

## 2022-05-07 NOTE — Telephone Encounter (Signed)
Pt c/o BP issue: STAT if pt c/o blurred vision, one-sided weakness or slurred speech  1. What are your last 5 BP readings? 128/81, this morning 86/47 few minutes later came up to 137/70  2. Are you having any other symptoms (ex. Dizziness, headache, blurred vision, passed out)? Gets a flutter in chest every once in a while once a week since her pacemaker was put in, fatigue for 2 weeks  3. What is your BP issue? Patient states her BP was very low this morning, but when she took it again a few minutes later it came back up.   Pt c/o swelling: STAT is pt has developed SOB within 24 hours  If swelling, where is the swelling located? In stomach  How much weight have you gained and in what time span? 4 lbs in a 4 days  Have you gained 3 pounds in a day or 5 pounds in a week? 4 lbs in 4 days  Do you have a log of your daily weights (if so, list)? Is 205 lbs now  Are you currently taking a fluid pill? yes  Are you currently SOB? no  Have you traveled recently? no  Patient states she has swelling in her stomach. She says she has liver issues and the swelling is near the liver. She says her liver issues are better than they used to be [facility] She says she does an hour of aerobics and then does water aerobics  Patient c/o Palpitations:  High priority if patient c/o lightheadedness, shortness of breath, or chest pain  How long have you had palpitations/irregular HR/ Afib? Are you having the symptoms now? Since her pacemaker was put in, no  Are you currently experiencing lightheadedness, SOB or CP? Lightheadedness not now, and gets some sob not now  Do you have a history of afib (atrial fibrillation) or irregular heart rhythm? yes  Have you checked your BP or HR? (document readings if available): 128/81, this morning 86/47 few minutes later came up to 137/70  Are you experiencing any other symptoms? Swelling in stomach

## 2022-05-07 NOTE — Telephone Encounter (Signed)
Pt 9/12 Pacer transmission assessed;   Per device, no VT, SVT, or A-Fib seen.   Pt called back to make her aware of the above data received.  Pt verbalized understanding.    Pt concerned about ABD edema, Wt gain, and ongoing Fatigue, requested an earlier appointment than November to see Dr. Johney Frame.    Pt scheduled to see Dr. Johney Frame to address her concerns on 05/20/2022 at 1130 am.  Pt advised that if her symptoms continue, worsen, or has additional symptoms to call us, or go to nearest ER.    Pt understood, and stated 9/27 appointment will work.  All concerns in original Triage message were addressed, Pt stated she took all of her medications as ordered today.  No f/u required.

## 2022-05-07 NOTE — Telephone Encounter (Signed)
Called Pt back regarding message received in Triage.   [facility] participating in chair aerobics at time of call.   Pt c/o: Fatigue 2 weeks, occasional palpitations, low BP this morning 86/47 after getting up, resolved after a few minutes to 137/70; Swelling in abdomen that went away after taking Lasix, 4 lb weight gain in 4 days, occasional lightheadedness with ambulation.    See note below.    Pt MyChart messaged to send pacemaker transmission when she gets home.    Will assess transmission, and determine who to see for appointment to address concerns next week.  Concerns above non-emergent.  Follow up required.

## 2022-05-08 NOTE — Patient Instructions (Signed)
Visit Information  Thank you for taking time to visit with me today. Please don't hesitate to contact me if I can be of assistance to you before our next scheduled telephone appointment.  Following are the goals we discussed today:   Patient Goals/Self-Care Activities Over the next 60 days, patient will:  take medications as prescribed  Follow Up Plan: Telephone follow up appointment with care management team member scheduled for: 1-2 months  Please call the care guide team at [phone] if you need to cancel or reschedule your appointment.   Tanya Hogan  Patient verbalizes understanding of instructions and care plan provided today and agrees to view in [place]. Active MyChart status and patient understanding of how to access instructions and care plan via MyChart confirmed with patient.

## 2022-05-17 NOTE — Progress Notes (Deleted)
Cardiology Office Note:    Date:  05/17/2022   ID:  Tanya Hogan, DOB 1951/03/18, MRN [id number]  PCP:  Hali Marry, MD   [facility] Providers Cardiologist:  Freada Bergeron, MD {   Referring MD: Hali Marry, *    History of Present Illness:    Tanya Hogan is a 71 y.o. female with a hx of chronic systolic heart failure with LVEF 35-40%>20% on TTE 08/2021, NICM, CHB s/p BiV PPM placement s/p recent gent change in 94/7096, nonalcoholic cirrhosis, DMII HTN, HLD, PVD, and breast cancer s/p mastectomy who presents to clinic for follow-up.  Per review of the record, the patient has a known history of NICM with cath in 01/2020 at OSH with normal coronaries. TTE 01/2020 with LVEF 35-40%.  Was admitted in the ED at [facility] in 08/2021 for decompensated HF with BNP 4700. She was started on bumex BID. Repeat TTE with EF 20%. She was started on spiro and jardiance at that time.  Saw Nicholes Rough in 09/2021 where she had low energy. She stopped the spiro with improvement. She was also on ozempic. Has not tolerated ARB due to itching.  She underwent generator change with Dr. Lovena Le in 10/2021.  Repeat limited TTE 01/08/22 showed LVEF 20-25%, mild-to-moderate LV dilation, G1DD, trivial MR, normal PASP consistent with prior TTE in [facility].   She last saw Nicholes Rough in clinic on 03/03/22. Was doing well from a CV standpoint but had been having a lot of IBS symptoms.  Today, ***  Past Medical History:  Diagnosis Date   Alkaline phosphatase elevation    Asthma    Breast cancer ([place]) 2011   Carpal tunnel syndrome    Carpal tunnel syndrome    bilateral   CHF (congestive heart failure) (HCC)    Cirrhosis, non-alcoholic (HCC)    Closed fracture of unspecified part of radius (alone)    DDD (degenerative disc disease), lumbar    Depression    pt denies   Diabetes mellitus without complication ([place])    Enlarged heart    Fibromyalgia    GERD  (gastroesophageal reflux disease)    History of kidney stones    HTN (hypertension)    Hypothyroidism    IBS (irritable bowel syndrome)    LBBB (left bundle branch block)    Microcytic anemia 05/14/2017   Pneumonia    Presence of permanent cardiac pacemaker    PVD (peripheral vascular disease) ([place])    2019   Sleep apnea    uses cpap    Past Surgical History:  Procedure Laterality Date   BIV PACEMAKER GENERATOR CHANGEOUT N/A 11/03/2021   Procedure: BIV PACEMAKER GENERATOR CHANGEOUT;  Surgeon: Evans Lance, MD;  Location: [place] [facility];  Service: Cardiovascular;  Laterality: N/A;   BREAST LUMPECTOMY Left 2011   COLONOSCOPY     FOOT SURGERY     KNEE SURGERY     LEAD REVISION/REPAIR N/A 11/03/2021   Procedure: LEAD REVISION/REPAIR;  Surgeon: Evans Lance, MD;  Location: [place] [facility];  Service: Cardiovascular;  Laterality: N/A;   MASTECTOMY Right    05/2019   PACEMAKER GENERATOR CHANGE N/A 08/23/2014   Procedure: PACEMAKER GENERATOR CHANGE;  Surgeon: Evans Lance, MD;  Location: [facility];  Service: Cardiovascular;  Laterality: N/A;   PACEMAKER PLACEMENT  2009   [place] [facility]   RECTAL SURGERY  1976   REDUCTION MAMMAPLASTY Left    05/2019   TOTAL SHOULDER  ARTHROPLASTY Right 03/10/2018   TOTAL SHOULDER ARTHROPLASTY Right 03/10/2018   Procedure: RIGHT TOTAL SHOULDER ARTHROPLASTY;  Surgeon: [name], MD;  Location: [place];  Service: Orthopedics;  Laterality: Right;   TUBAL LIGATION      Current Medications: No outpatient medications have been marked as taking for the 05/20/22 encounter (Appointment) with Freada Bergeron, MD.     Allergies:   Injectafer [ferric carboxymaltose], Penicillins, Accupril [quinapril hcl], Aleve [naproxen], Celebrex [celecoxib], Cozaar [losartan potassium], Flonase [fluticasone], Levomenol, Lipitor [atorvastatin], Nasonex [mometasone furoate], Xyzal [levocetirizine], Chamomile, Vesicare [solifenacin], Aspirin, Baking  soda-fluoride [sodium fluoride], Clindamycin/lincomycin, Codeine, Furosemide, Lactose intolerance (gi), Lanolin-petrolatum, Metformin and related, Miralax [polyethylene glycol], Quinine, Reclast [zoledronic acid], Rifampin, Sulfa antibiotics, Sulfonamide derivatives, Tramadol, Wellbutrin [bupropion], and Wool alcohol [lanolin]   Social History   Socioeconomic History   Marital status: Divorced    Spouse name: Not on file   Number of children: 2   Years of education: 14   Highest education level: Associate degree: academic program  Occupational History   Occupation: quality insurcance in [employer]    Comment: retired  Tobacco Use   Smoking status: Former    Packs/day: 0.00    Years: 0.00    Total pack years: 0.00    Types: Cigarettes    Quit date: 08/24/1966    Years since quitting: 55.7   Smokeless tobacco: Never  Vaping Use   Vaping Use: Never used  Substance and Sexual Activity   Alcohol use: No   Drug use: No   Sexual activity: Not Currently  Other Topics Concern   Not on file  Social History Narrative   Retired. Lives with and helps her brother. Likes Systems developer, Social worker and music. Loves animals.   Social Determinants of Health   Financial Resource Strain: Low Risk  (02/25/2021)   Overall Financial Resource Strain (CARDIA)    Difficulty of Paying Living Expenses: Not hard at all  Food Insecurity: No Food Insecurity (02/25/2021)   Hunger Vital Sign    Worried About Running Out of Food in the Last Year: Never true    Ran Out of Food in the Last Year: Never true  Transportation Needs: No Transportation Needs (02/25/2021)   PRAPARE - Hydrologist (Medical): No    Lack of Transportation (Non-Medical): No  Physical Activity: Inactive (02/25/2021)   Exercise Vital Sign    Days of Exercise per Week: 0 days    Minutes of Exercise per Session: 0 min  Stress: No Stress Concern Present (02/25/2021)   [place]    Feeling of Stress : Not at all  Social Connections: Socially Isolated (02/25/2021)   Social Connection and Isolation Panel [NHANES]    Frequency of Communication with Friends and Family: More than three times a week    Frequency of Social Gatherings with Friends and Family: Once a week    Attends Religious Services: Never    Marine scientist or Organizations: No    Attends Music therapist: Never    Marital Status: Divorced     Family History: The patient's family history includes Breast cancer in an other family member; Heart disease in her brother, father, and mother; Heart failure in an other family member; Hypertension in an other family member; Melanoma in her mother; Parkinson's disease in her mother.  ROS:   Review of Systems  Constitutional:  Negative for chills and fever.  HENT:  Negative for ear pain and sore throat.   Eyes:  Negative for pain.  Respiratory:  Negative for cough and sputum production.   Cardiovascular:  Negative for chest pain, palpitations, orthopnea, claudication, leg swelling and PND.  Gastrointestinal:  Negative for heartburn and vomiting.  Genitourinary:  Negative for hematuria.  Musculoskeletal:  Negative for back pain.  Neurological:  Negative for speech change and loss of consciousness.  Endo/Heme/Allergies:  Does not bruise/bleed easily.  Psychiatric/Behavioral:  Negative for depression and hallucinations.      EKGs/Labs/Other Studies Reviewed:    The following studies were reviewed today:  TTE 08/2021 (OSH): Left Ventricle  Left ventricle is mildly dilated. Wall thickness is normal. Systolic function is severely abnormal. EF: 20 %. Wall motion is normal.   Right Ventricle  Right ventricle is mildly dilated. Systolic function is moderately reduced. Abnormal tricuspid annular plane systolic excursion (TAPSE) <1.7 cm.   Left Atrium  Left atrium is mildly dilated.   Right Atrium   Right atrium size is normal.   IVC/SVC  The inferior vena cava demonstrates a diameter of >2.1 cm and collapses >50%; therefore, the right atrial pressure is estimated at 8 mmHg.   Mitral Valve  Mitral valve structure is normal. There is mild regurgitation with a centrally directed jet.   Tricuspid Valve  Tricuspid valve structure is normal. There is trace regurgitation. The right ventricular systolic pressure is mildly elevated (37-49 mmHg).   Aortic Valve  The aortic valve was not well visualized.   Ascending Aorta  The aortic root is normal in size. The ascending aorta is normal in size.   Pericardium  There is no pericardial effusion.   Study Details  A complete echo was performed using complete 2D, color flow Doppler and spectral Doppler. During the study the apical, parasternal and subcostal views were captured. Overall the study quality was adequate. The study was technically difficult. The study was difficult due to patient's clinical status and body habitus.  EKG:  EKG is personally reviewed. 12/22/2021: EKG was not ordered. 11/04/2021: NSR with ventricular pacing.  Recent Labs: 02/13/2022: Hemoglobin 15.1; Platelets 253 02/16/2022: Brain Natriuretic Peptide 294; TSH 0.93 04/06/2022: ALT 22; BUN 13; Creatinine, Ser 0.94; Potassium 5.3; Sodium 138   Recent Lipid Panel    Component Value Date/Time   CHOL 168 04/16/2021 0000   TRIG 120 04/16/2021 0000   HDL 73 04/16/2021 0000   CHOLHDL 2.3 04/16/2021 0000   VLDL 26 02/18/2017 0939   LDLCALC 74 04/16/2021 0000     Risk Assessment/Calculations:           Physical Exam:    VS:  There were no vitals taken for this visit.    Wt Readings from Last 3 Encounters:  04/08/22 207 lb (93.9 kg)  03/03/22 203 lb 9.6 oz (92.4 kg)  02/27/22 210 lb 1.6 oz (95.3 kg)     GEN: Well nourished, well developed in no acute distress HEENT: Normal NECK: No JVD; No carotid bruits CARDIAC: RRR, no murmurs, rubs,  gallops RESPIRATORY:  Clear to auscultation without rales, wheezing or rhonchi  ABDOMEN: Soft, non-tender, non-distended MUSCULOSKELETAL:  No edema; No deformity  SKIN: Warm and dry NEUROLOGIC:  Alert and oriented x 3 PSYCHIATRIC:  Normal affect   ASSESSMENT:    No diagnosis found.  PLAN:    In order of problems listed above:  #Chronic Systolic HF: #NICM: TTE 16/1096 with LVEF 20-25% consistent with TTE in 08/2021 from [facility]. Cath 01/2020 without obstructive disease. GDMT limited  due to allergies/sensitivities. Currently appears euvolemic and compensated with NYHA class II-III symptoms.   -Continue bumex 67m BID with K supplementation -Continue coreg 3.1254mdaily -Continue ozempic -Off jardiance due to *** -Did not tolerate ARB or spironolactone -Low Na diet -Daily weights -BMET today  #CHB s/p BiV PPM Placement: -Follows with Dr. TaLovena Le#OS[facility] -Followed by Dr. NaEarly OsmondContinue BiPAP  #HTN: Well controlled and at goal -Continue coreg 3.12512mID  #DMII: -Continue ozempic  -Off jardiance due to *** -Management per PCP  #HLD: -Continue pravastatin 42m65mily         Follow-up:   6 months.  Medication Adjustments/Labs and Tests Ordered: Current medicines are reviewed at length with the patient today.  Concerns regarding medicines are outlined above.   No orders of the defined types were placed in this encounter.  No orders of the defined types were placed in this encounter.  There are no Patient Instructions on file for this visit.    I,Mathew Stumpf,acting as a scriEducation administrator HeatFreada Bergeron.,have documented all relevant documentation on the behalf of HeatFreada Bergeron,as directed by  HeatFreada Bergeron while in the presence of HeatFreada Bergeron.  I, HeatFreada Bergeron, have reviewed all documentation for this visit. The documentation on 05/17/22 for the exam, diagnosis, procedures, and orders are all accurate and complete.    Signed, HeatFreada Bergeron  05/17/2022 8:20 AM    [facility]

## 2022-05-20 ENCOUNTER — Ambulatory Visit: Payer: Medicare Other | Admitting: Cardiology

## 2022-05-20 DIAGNOSIS — G4733 Obstructive sleep apnea (adult) (pediatric): Secondary | ICD-10-CM | POA: Diagnosis not present

## 2022-05-20 DIAGNOSIS — G4737 Central sleep apnea in conditions classified elsewhere: Secondary | ICD-10-CM | POA: Diagnosis not present

## 2022-05-20 NOTE — Progress Notes (Signed)
Cardiology Office Note:    Date:  05/22/2022   ID:  Tanya Hogan, DOB 1950-11-06, MRN [id number]  PCP:  Hali Marry, MD   [facility] Providers Cardiologist:  Freada Bergeron, MD     Referring MD: Hali Marry, *   Chief Complaint: fatigue  History of Present Illness:    Tanya Hogan is a pleasant 71 y.o. female with a hx of chronic HFrEF with LVEF 35 to 40% ? 20% on TTE 08/2021, NICM,, CHB s/p biventricular PPM implant with recent gen change 08/3084, nonalcoholic cirrhosis, depression, GERD, LBBB, diabetes, HTN, breast CA s/p mastectomy, and hypothyroidism.  Known history of NICM with cath 01/2020 at OSH with normal coronaries.  TTE 01/2020 with LVEF 35 to 40%.  Was admitted to ED at [facility] 08/2021 for decompensated HF with BNP 4700.  She was started on Bumex twice daily.  Repeat TTE with EF 20%, was started on spironolactone and Jardiance at that time.  Seen by Nicholes Rough, PA 09/2021 where she had low energy.  She stopped spironolactone with improvement.  Was also on Ozempic.  Has not tolerated ARB due to itching.  She underwent generator change with Dr. Lovena Le 10/2021.  Seen by Dr. Johney Frame 12/22/2021 and reported she was feeling well with improved oxygen levels.  Reported only wearing oxygen for saturation < 90%.  Takes extra Bumex if ankles become puffy.  Echocardiogram revealed LVEF 20 to 25% with no significant changes from prior echo report from [place].  No medication changes were advised.   Seen in follow-up on 03/03/22 by Nicholes Rough, PA at which time she had no specific concerns.  Was advised to record her weight daily and take extra Bumex for weight gain.  Kidney function and electrolytes were stable on bmet and she was advised to f/u in November with Dr. Johney Frame.   She contacted our office 05/07/2022 with complaints of fatigue x2 weeks, occasional palpitations, low BP the morning of the call 86/47 after getting up from bed with improvement a few  minutes later to 137/70, swelling in abdomen that went away after taking diuretic, 4 pound weight gain in 4 days, and occasional lightheadedness with ambulation.  She was asked to send a pacemaker transmission which revealed no VT, no SVT, or A-fib seen.  She requested a sooner appointment than November and was scheduled to see Dr. Johney Frame on 9/27, unfortunately Dr. Johney Frame had to be out of the office and she was moved to my schedule.  Today, she is here alone for evaluation. She uses a rollator Clear. States she gets symptoms of fluttering in the center of her chest that cause her to lose her breath, she stops and then resumes activities after a few seconds. Symptoms strong enough to cause her to stop. Most often notices it if she picks up something heavy. Doing chair aerobics and water aerobics almost daily, but has not been this week due to having a new puppy. Feeling of fatigue described as a balloon being deflated. No tachycardia. Feels like her breathing is improved recently. Home BPs mostly 578I systolic. No presyncope, syncope, orthopnea, or PND. Hydrating well. She has complicated history from both [facility] and [facility] and multiple non-specific complaints.   Past Medical History:  Diagnosis Date   Alkaline phosphatase elevation    Asthma    Breast cancer [facility]) 2011   Carpal tunnel syndrome    Carpal tunnel syndrome    bilateral   CHF (congestive heart failure) ([place])  Cirrhosis, non-alcoholic (HCC)    Closed fracture of unspecified part of radius (alone)    DDD (degenerative disc disease), lumbar    Depression    pt denies   Diabetes mellitus without complication ([place])    Enlarged heart    Fibromyalgia    GERD (gastroesophageal reflux disease)    History of kidney stones    HTN (hypertension)    Hypothyroidism    IBS (irritable bowel syndrome)    LBBB (left bundle branch block)    Microcytic anemia 05/14/2017   Pneumonia    Presence of permanent cardiac pacemaker    PVD  (peripheral vascular disease) ([place])    2019   Sleep apnea    uses cpap    Past Surgical History:  Procedure Laterality Date   BIV PACEMAKER GENERATOR CHANGEOUT N/A 11/03/2021   Procedure: BIV PACEMAKER GENERATOR CHANGEOUT;  Surgeon: Evans Lance, MD;  Location: [place] [facility];  Service: Cardiovascular;  Laterality: N/A;   BREAST LUMPECTOMY Left 2011   COLONOSCOPY     FOOT SURGERY     KNEE SURGERY     LEAD REVISION/REPAIR N/A 11/03/2021   Procedure: LEAD REVISION/REPAIR;  Surgeon: Evans Lance, MD;  Location: [place] [facility];  Service: Cardiovascular;  Laterality: N/A;   MASTECTOMY Right    05/2019   PACEMAKER GENERATOR CHANGE N/A 08/23/2014   Procedure: PACEMAKER GENERATOR CHANGE;  Surgeon: Evans Lance, MD;  Location: [facility] C[facility];  Service: Cardiovascular;  Laterality: N/A;   PACEMAKER PLACEMENT  2009   [place] [facility]   RECTAL SURGERY  1976   REDUCTION MAMMAPLASTY Left    05/2019   TOTAL SHOULDER ARTHROPLASTY Right 03/10/2018   TOTAL SHOULDER ARTHROPLASTY Right 03/10/2018   Procedure: RIGHT TOTAL SHOULDER ARTHROPLASTY;  Surgeon: [name], MD;  Location: [place];  Service: Orthopedics;  Laterality: Right;   TUBAL LIGATION      Current Medications: Current Meds  Medication Sig   acetaminophen (TYLENOL) 500 MG tablet Take 500-1,000 mg by mouth every 6 (six) hours as needed (for pain.).   albuterol (PROVENTIL) (2.5 MG/3ML) 0.083% nebulizer solution Take 2.5 mg by nebulization every 6 (six) hours as needed for wheezing or shortness of breath.   albuterol (VENTOLIN HFA) 108 (90 Base) MCG/ACT inhaler Inhale 1-2 puffs into the lungs every 6 (six) hours as needed for wheezing or shortness of breath.   alendronate (FOSAMAX) 70 MG tablet TAKE 1 TABLET BY MOUTH  EVERY 7 DAYS WITH A FULL  GLASS OF WATER ON AN EMPTY  STOMACH   AMBULATORY NON FORMULARY MEDICATION Medication Name: Tubing and supplies for nebulizer. Fax - [phone]   amiodarone (PACERONE) 200 MG  tablet Take 0.5 tablets (100 mg total) by mouth daily.   B Complex-C (B-COMPLEX WITH VITAMIN C) tablet Take 1 tablet by mouth in the morning.   bumetanide (BUMEX) 1 MG tablet Take 1 tablet (1 mg total) by mouth 2 (two) times daily. You may take an additional tablet (1 mg) as needed for lower extremity edema   carvedilol (COREG) 3.125 MG tablet Take 1 tablet (3.125 mg total) by mouth 2 (two) times daily.   Continuous Blood Gluc Receiver (FREESTYLE LIBRE 2 READER) DEVI by Does not apply route.   Continuous Blood Gluc Sensor (FREESTYLE LIBRE 2 SENSOR) MISC by Does not apply route.   EPINEPHRINE 0.3 mg/0.3 mL IJ SOAJ injection INJECT 1 PEN IN THE MUSCLE ONE TIME AS DIRECTED FOR ANAPHYLAXIS. PLEASE CALL 911 AFTERWARDS   fexofenadine (ALLEGRA) 180  MG tablet Take 180 mg by mouth in the morning.   fluticasone-salmeterol (ADVAIR HFA) 115-21 MCG/ACT inhaler Inhale 2 puffs into the lungs 2 (two) times daily.   levothyroxine (SYNTHROID) 88 MCG tablet TAKE 1 TABLET(88 MCG) BY MOUTH DAILY   LINZESS 72 MCG capsule Take 72-144 mcg by mouth 4 (four) times daily as needed (constipation.).   montelukast (SINGULAIR) 10 MG tablet Take by mouth.   Multiple Vitamins-Minerals (OCUVITE EYE + MULTI) TABS Take 1 tablet by mouth in the morning.   pantoprazole (PROTONIX) 40 MG tablet Take 1 tablet (40 mg total) by mouth daily before breakfast.   PAPAYA ENZYME PO Take 1 capsule by mouth in the morning.   pravastatin (PRAVACHOL) 40 MG tablet TAKE 1 TABLET BY MOUTH AT  BEDTIME   Probiotic Product ([employer] PROBIOTIC) CAPS Take 2 capsules by mouth in the morning.   Semaglutide,0.25 or 0.5MG/DOS, (OZEMPIC, 0.25 OR 0.5 MG/DOSE,) 2 MG/1.5ML SOPN Inject 0.25 mg into the skin every Saturday.   Tiotropium Bromide Monohydrate (SPIRIVA RESPIMAT) 1.25 MCG/ACT AERS Inhale 2 puffs into the lungs daily.   valACYclovir (VALTREX) 1000 MG tablet Take 1 tablet (1,000 mg total) by mouth 2 (two) times daily. (Patient taking differently: Take 1,000 mg  by mouth 2 (two) times daily as needed (fever blisters/cold sores.).)   [DISCONTINUED] amiodarone (PACERONE) 200 MG tablet TAKE ONE-HALF TABLET BY MOUTH  DAILY     Allergies:   Injectafer [ferric carboxymaltose], Penicillins, Accupril [quinapril hcl], Aleve [naproxen], Celebrex [celecoxib], Cozaar [losartan potassium], Flonase [fluticasone], Levomenol, Lipitor [atorvastatin], Nasonex [mometasone furoate], Xyzal [levocetirizine], Chamomile, Vesicare [solifenacin], Aspirin, Baking soda-fluoride [sodium fluoride], Clindamycin/lincomycin, Codeine, Furosemide, Lactose intolerance (gi), Lanolin-petrolatum, Metformin and related, Miralax [polyethylene glycol], Quinine, Reclast [zoledronic acid], Rifampin, Sulfa antibiotics, Sulfonamide derivatives, Tramadol, Wellbutrin [bupropion], and Wool alcohol [lanolin]   Social History   Socioeconomic History   Marital status: Divorced    Spouse name: Not on file   Number of children: 2   Years of education: 14   Highest education level: Associate degree: academic program  Occupational History   Occupation: quality insurcance in [employer]    Comment: retired  Tobacco Use   Smoking status: Former    Packs/day: 0.00    Years: 0.00    Total pack years: 0.00    Types: Cigarettes    Quit date: 08/24/1966    Years since quitting: 55.7   Smokeless tobacco: Never  Vaping Use   Vaping Use: Never used  Substance and Sexual Activity   Alcohol use: No   Drug use: No   Sexual activity: Not Currently  Other Topics Concern   Not on file  Social History Narrative   Retired. Lives with and helps her brother. Likes Systems developer, Social worker and music. Loves animals.   Social Determinants of Health   Financial Resource Strain: Low Risk  (02/25/2021)   Overall Financial Resource Strain (CARDIA)    Difficulty of Paying Living Expenses: Not hard at all  Food Insecurity: No Food Insecurity (02/25/2021)   Hunger Vital Sign    Worried About Running Out of  Food in the Last Year: Never true    Ran Out of Food in the Last Year: Never true  Transportation Needs: No Transportation Needs (02/25/2021)   PRAPARE - Hydrologist (Medical): No    Lack of Transportation (Non-Medical): No  Physical Activity: Inactive (02/25/2021)   Exercise Vital Sign    Days of Exercise per Week: 0 days    Minutes  of Exercise per Session: 0 min  Stress: No Stress Concern Present (02/25/2021)   [place]    Feeling of Stress : Not at all  Social Connections: Socially Isolated (02/25/2021)   Social Connection and Isolation Panel [NHANES]    Frequency of Communication with Friends and Family: More than three times a week    Frequency of Social Gatherings with Friends and Family: Once a week    Attends Religious Services: Never    Marine scientist or Organizations: No    Attends Music therapist: Never    Marital Status: Divorced     Family History: The patient's family history includes Breast cancer in an other family member; Heart disease in her brother, father, and mother; Heart failure in an other family member; Hypertension in an other family member; Melanoma in her mother; Parkinson's disease in her mother.  ROS:   Please see the history of present illness.    + fatigue +  palpitations All other systems reviewed and are negative.  Labs/Other Studies Reviewed:    The following studies were reviewed today:  Echo 01/08/22  1. Left ventricular ejection fraction, by estimation, is 20 to 25%. The  left ventricle has severely decreased function. The left ventricle  demonstrates regional wall motion abnormalities (see scoring  diagram/findings for description). The left  ventricular internal cavity size was mildly to moderately dilated. Left  ventricular diastolic parameters are consistent with Grade I diastolic  dysfunction (impaired relaxation).  Elevated left ventricular end-diastolic  pressure.   2. Trivial mitral valve regurgitation.   3. The aortic valve is tricuspid.   4. There is normal pulmonary artery systolic pressure.   Comparison(s): Care Everywhere @ [facility] 09/21/21 EF 20%.    Recent Labs: 02/13/2022: Hemoglobin 15.1; Platelets 253 02/16/2022: Brain Natriuretic Peptide 294; TSH 0.93 04/06/2022: ALT 22; BUN 13; Creatinine, Ser 0.94; Potassium 5.3; Sodium 138  Recent Lipid Panel    Component Value Date/Time   CHOL 168 04/16/2021 0000   TRIG 120 04/16/2021 0000   HDL 73 04/16/2021 0000   CHOLHDL 2.3 04/16/2021 0000   VLDL 26 02/18/2017 0939   LDLCALC 74 04/16/2021 0000     Risk Assessment/Calculations:       Physical Exam:    VS:  BP 110/74   Pulse 82   Ht 5' 1"  (1.549 m)   Wt 208 lb 3.2 oz (94.4 kg)   SpO2 99%   BMI 39.34 kg/m     Wt Readings from Last 3 Encounters:  05/21/22 208 lb 3.2 oz (94.4 kg)  04/08/22 207 lb (93.9 kg)  03/03/22 203 lb 9.6 oz (92.4 kg)     GEN:  Well developed, obese in no acute distress HEENT: Normal NECK: No JVD; No carotid bruits CARDIAC: RRR, no murmurs, rubs, gallops RESPIRATORY:  Clear to auscultation without rales, wheezing or rhonchi  ABDOMEN: Soft, non-tender, non-distended MUSCULOSKELETAL:  No edema; No deformity. 2+ pedal pulses, equal bilaterally SKIN: Warm and dry NEUROLOGIC:  Alert and oriented x 3 PSYCHIATRIC:  Normal affect   EKG:  EKG is not ordered today.    Diagnoses:    1. HFrEF (heart failure with reduced ejection fraction) ([place])   2. NSVT (nonsustained ventricular tachycardia) ([place])   3. Complete heart block ([place])   4. Fatigue, unspecified type    Assessment and Plan:     Fatigue: Increased episodes of fatigue, particularly when attempting to carry something heavy. BP soft at  times. Lengthy discussion about frequency of symptoms and interference with regular activities. She remains active without significant symptoms. Reports "this may be  my new normal."  Recent lab work reviewed with no clear indication for worsening fatigue. Encouraged her to continue to monitor for worsening episodes of fatigue or with interference with regular activities and call back to report if worsened prior to next office visit. She is agreeable with plan.   Chronic HFrEF: LVEF 20 to 25%, G1DD. Denies Appears euvolemic today. Denies dyspnea, orthopnea, PND, or edema.  Reports her breathing feels very good right now.  Consideration to decreasing Bumex for diuresis due to cc fatigue, however she reports historically she experiences worsening swelling on lower doses of diuretic. Will continue Bumex at 1 mg twice daily.  Continue carvedilol.  Medication management: Multiple prior intolerances. History of self adjusting medications to her preferences. Initially advised her to hold amiodarone to see if it is contributing to fatigue, however upon further evaluation into her history of NSVT, I called her back and advised her to continue amiodarone 100 mg daily. Carvedilol cannot be further reduced, she is agreeable to continue for now.   NSVT: Seen on device transmission. No recent episodes of presyncope or syncope. Occasional palpitations which resolve quickly with rest. Continue amiodarone 100 mg daily.   Pacemaker: No evidence of device malfunction. Remote transmission 05/05/22 revealed stable function. Continue remote device transmissions. Management per EP.      Disposition: Keep your November appointment with Dr. Johney Frame   Medication Adjustments/Labs and Tests Ordered: Current medicines are reviewed at length with the patient today.  Concerns regarding medicines are outlined above.  No orders of the defined types were placed in this encounter.  Meds ordered this encounter  Medications   amiodarone (PACERONE) 200 MG tablet    Sig: Take 0.5 tablets (100 mg total) by mouth daily.    Dispense:  45 tablet    Refill:  3    Order Specific Question:    Supervising Provider    Answer:   Thayer Headings [phone]    Patient Instructions  Medication Instructions:   Your physician recommends that you continue on your current medications as directed. Please refer to the Current Medication list given to you today.  *If you need a refill on your cardiac medications before your next appointment, please call your pharmacy*   Lab Work:  None ordered.  If you have labs (blood work) drawn today and your tests are completely normal, you will receive your results only by: [place] (if you have MyChart) OR A paper copy in the mail If you have any lab test that is abnormal or we need to change your treatment, we will call you to review the results.   Testing/Procedures:  None ordered.   Follow-Up: At [facility], you and your health needs are our priority.  As part of our continuing mission to provide you with exceptional heart care, we have created designated Provider Care Teams.  These Care Teams include your primary Cardiologist (physician) and Advanced Practice Providers (APPs -  Physician Assistants and Nurse Practitioners) who all work together to provide you with the care you need, when you need it.  We recommend signing up for the patient portal called "MyChart".  Sign up information is provided on this After Visit Summary.  MyChart is used to connect with patients for Virtual Visits (Telemedicine).  Patients are able to view lab/test results, encounter notes, upcoming appointments, etc.  Non-urgent messages  can be sent to your provider as well.   To learn more about what you can do with MyChart, go to [email or web address].    Your next appointment:   2 month(s)  The format for your next appointment:   In Person  Provider:   Freada Bergeron, MD     Important Information About Sugar         Signed, Emmaline Life, NP  05/22/2022 8:41 AM    [place]

## 2022-05-21 ENCOUNTER — Encounter: Payer: Medicare Other | Attending: Cardiology | Admitting: Nurse Practitioner

## 2022-05-21 ENCOUNTER — Encounter: Payer: Self-pay | Admitting: Nurse Practitioner

## 2022-05-21 VITALS — BP 110/74 | HR 82 | Ht 61.0 in | Wt 208.2 lb

## 2022-05-21 DIAGNOSIS — I5020 Unspecified systolic (congestive) heart failure: Secondary | ICD-10-CM | POA: Insufficient documentation

## 2022-05-21 DIAGNOSIS — I4729 Other ventricular tachycardia: Secondary | ICD-10-CM | POA: Diagnosis not present

## 2022-05-21 DIAGNOSIS — I442 Atrioventricular block, complete: Secondary | ICD-10-CM | POA: Diagnosis not present

## 2022-05-21 DIAGNOSIS — R5383 Other fatigue: Secondary | ICD-10-CM

## 2022-05-21 NOTE — Patient Instructions (Addendum)
Medication Instructions:   Your physician recommends that you continue on your current medications as directed. Please refer to the Current Medication list given to you today.  *If you need a refill on your cardiac medications before your next appointment, please call your pharmacy*   Lab Work:  None ordered.  If you have labs (blood work) drawn today and your tests are completely normal, you will receive your results only by: [place] (if you have MyChart) OR A paper copy in the mail If you have any lab test that is abnormal or we need to change your treatment, we will call you to review the results.   Testing/Procedures:  None ordered.   Follow-Up: At [name], you and your health needs are our priority.  As part of our continuing mission to provide you with exceptional heart care, we have created designated Provider Care Teams.  These Care Teams include your primary Cardiologist (physician) and Advanced Practice Providers (APPs -  Physician Assistants and Nurse Practitioners) who all work together to provide you with the care you need, when you need it.  We recommend signing up for the patient portal called "MyChart".  Sign up information is provided on this After Visit Summary.  MyChart is used to connect with patients for Virtual Visits (Telemedicine).  Patients are able to view lab/test results, encounter notes, upcoming appointments, etc.  Non-urgent messages can be sent to your provider as well.   To learn more about what you can do with MyChart, go to [email or web address].    Your next appointment:   2 month(s)  The format for your next appointment:   In Person  Provider:   Freada Bergeron, MD     Important Information About Sugar

## 2022-05-21 NOTE — Progress Notes (Signed)
Remote pacemaker transmission.   

## 2022-05-22 ENCOUNTER — Encounter: Payer: Self-pay | Admitting: Nurse Practitioner

## 2022-05-22 MED ORDER — AMIODARONE HCL 200 MG PO TABS: 100.0000 mg | ORAL_TABLET | Freq: Every day | ORAL | 3 refills | Status: DC

## 2022-05-23 DIAGNOSIS — E1169 Type 2 diabetes mellitus with other specified complication: Secondary | ICD-10-CM

## 2022-05-23 DIAGNOSIS — I10 Essential (primary) hypertension: Secondary | ICD-10-CM | POA: Diagnosis not present

## 2022-05-23 DIAGNOSIS — Z7985 Long-term (current) use of injectable non-insulin antidiabetic drugs: Secondary | ICD-10-CM | POA: Diagnosis not present

## 2022-05-23 DIAGNOSIS — E785 Hyperlipidemia, unspecified: Secondary | ICD-10-CM | POA: Diagnosis not present

## 2022-05-24 DIAGNOSIS — E118 Type 2 diabetes mellitus with unspecified complications: Secondary | ICD-10-CM | POA: Diagnosis not present

## 2022-05-25 ENCOUNTER — Ambulatory Visit: Payer: Medicare Other | Admitting: Family Medicine

## 2022-05-26 ENCOUNTER — Ambulatory Visit: Payer: Medicare Other | Admitting: Family Medicine

## 2022-05-26 LAB — HM DIABETES EYE EXAM

## 2022-06-12 ENCOUNTER — Telehealth: Payer: Self-pay | Admitting: Pharmacist

## 2022-06-12 ENCOUNTER — Telehealth: Payer: Self-pay | Admitting: Family Medicine

## 2022-06-12 NOTE — Telephone Encounter (Signed)
Received 5 boxes ozempic 0.25/0.5 on 06/12/22 at 9:40 am. I will call patient for pickup and place in fridge  I spoke with patient and she is aware medication is here. - CF

## 2022-06-12 NOTE — Telephone Encounter (Signed)
Patient picked up medication - Tanya Hogan

## 2022-06-12 NOTE — Telephone Encounter (Signed)
Good Afternoon, Tanya Hogan called today 06-12-22 @ 4:20pm and states she can't make her appointment on Tuesday [phone] she has a conflict. She asked to be rescheduled for her and her brother. She can be reached at # [phone].

## 2022-06-16 ENCOUNTER — Ambulatory Visit (INDEPENDENT_AMBULATORY_CARE_PROVIDER_SITE_OTHER): Payer: Medicare Other | Admitting: Pharmacist

## 2022-06-16 DIAGNOSIS — I5022 Chronic systolic (congestive) heart failure: Secondary | ICD-10-CM

## 2022-06-16 DIAGNOSIS — E118 Type 2 diabetes mellitus with unspecified complications: Secondary | ICD-10-CM

## 2022-06-16 DIAGNOSIS — E785 Hyperlipidemia, unspecified: Secondary | ICD-10-CM

## 2022-06-16 DIAGNOSIS — E1169 Type 2 diabetes mellitus with other specified complication: Secondary | ICD-10-CM

## 2022-06-16 DIAGNOSIS — I10 Essential (primary) hypertension: Secondary | ICD-10-CM

## 2022-06-16 NOTE — Progress Notes (Incomplete)
 Chronic Care Management Pharmacy Note  06/16/2022 Name:  Tanya Hogan MRN:  [id number] DOB:  05/14/1951  Summary: addressed HTN, HLD, and primarily DM.   She provides an overall health update:  - GI: Stomach still acting up, but trying a few days worth of not taking linzess at all. If this doesn't fix it, she states we may need to investigate GI symptoms with ozempic as culprit. - Pulmonology - working on inhalers. Patient describes feeling as though her troubles are either sinuses, allergies, or infection.  - Fatigue, exhaustion, weight gain - working with cardiology & has appointment end of month.   She notes that she had some low BP with a glitch in her pacemaker settings, they have since adjusted.   She is exercising daily.  BG via sensors: 106, 132, 110. A1c 5.5  Recommendations/Changes made from today's visit:  - No medication changes, agree with current plans - Will provide ongoing support for 2024 renewal for ozempic patient assistance.  Plan: f/u with pharmacist in 1 month  Subjective: Tanya Hogan is an 71 y.o. year old female who is a primary patient of [name], [name] D, MD.  The CCM team was consulted for assistance with disease management and care coordination needs.    Engaged with patient by telephone for follow up visit in response to provider referral for pharmacy case management and/or care coordination services.   Consent to Services:  The patient was given information about Chronic Care Management services, agreed to services, and gave verbal consent prior to initiation of services.  Please see initial visit note for detailed documentation.   Patient Care Team: [name], [name] D, MD as PCP - General (Family Medicine) Pemberton, Heather E, MD as PCP - Cardiology (Cardiology) Khan, Kalsoom, MD (Hematology and Oncology) Crenshaw, Brian S, MD as Attending Physician (Cardiology) Taylor, Gregg W, MD (Cardiology) Dr. Lucas Inman  (Otolaryngology) Faidas, Anna, MD as Consulting Physician (Hematology and Oncology) Akdamar, Murat K, MD as Referring Physician (Gastroenterology) Freeman, Rex H, OD as Referring Physician (Optometry) Kline, Keesha J, RPH as Pharmacist (Pharmacist)   Objective:  Lab Results  Component Value Date   CREATININE 0.94 04/06/2022   CREATININE 0.89 03/03/2022   CREATININE 1.02 (H) 02/13/2022    Lab Results  Component Value Date   HGBA1C 5.5 04/08/2022   Last diabetic Eye exam:  Lab Results  Component Value Date/Time   HMDIABEYEEXA No Retinopathy 06/11/2021 12:00 AM    Last diabetic Foot exam: No results found for: "HMDIABFOOTEX"      Component Value Date/Time   CHOL 168 04/16/2021 0000   TRIG 120 04/16/2021 0000   HDL 73 04/16/2021 0000   CHOLHDL 2.3 04/16/2021 0000   VLDL 26 02/18/2017 0939   LDLCALC 74 04/16/2021 0000       Latest Ref Rng & Units 04/06/2022   11:00 AM 02/13/2022    4:01 PM 04/16/2021   12:00 AM  Hepatic Function  Total Protein 6.0 - 8.5 g/dL 7.1  7.5  7.1   Albumin 3.8 - 4.8 g/dL 4.7  4.0    AST 0 - 40 IU/L 21  29  22   ALT 0 - 32 IU/L 22  29  18   Alk Phosphatase 44 - 121 IU/L 125  110    Total Bilirubin 0.0 - 1.2 mg/dL 0.5  0.5  0.4     Lab Results  Component Value Date/Time   TSH 0.93 02/16/2022 01:43 PM   TSH 2.54 08/14/2021 12:00 AM         Latest Ref Rng & Units 02/13/2022    4:01 PM 10/24/2021   10:45 AM 04/16/2021   12:00 AM  CBC  WBC 4.0 - 10.5 K/uL 8.0  6.1  5.4   Hemoglobin 12.0 - 15.0 g/dL 15.1  15.1  12.2   Hematocrit 36.0 - 46.0 % 45.0  46.6  38.0   Platelets 150 - 400 K/uL 253  243  299     Lab Results  Component Value Date/Time   VD25OH 65 05/04/2014 02:27 PM   VD25OH 61 12/29/2010 08:05 AM    Clinical ASCVD: Yes  The 10-year ASCVD risk score (Arnett DK, et al., 2019) is: 22.8%   Values used to calculate the score:     Age: 71 years     Sex: Female     Is Non-Hispanic African American: No     Diabetic: Yes      Tobacco smoker: No     Systolic Blood Pressure: 110 mmHg     Is BP treated: Yes     HDL Cholesterol: 73 mg/dL     Total Cholesterol: 178 mg/dL      Social History   Tobacco Use  Smoking Status Former   Packs/day: 0.00   Years: 0.00   Total pack years: 0.00   Types: Cigarettes   Quit date: 08/24/1966   Years since quitting: 55.8  Smokeless Tobacco Never   BP Readings from Last 3 Encounters:  05/21/22 110/74  04/08/22 133/68  03/03/22 134/80   Pulse Readings from Last 3 Encounters:  05/21/22 82  04/08/22 89  03/03/22 83   Wt Readings from Last 3 Encounters:  05/21/22 208 lb 3.2 oz (94.4 kg)  04/08/22 207 lb (93.9 kg)  03/03/22 203 lb 9.6 oz (92.4 kg)    Assessment: Review of patient past medical history, allergies, medications, health status, including review of consultants reports, laboratory and other test data, was performed as part of comprehensive evaluation and provision of chronic care management services.   SDOH:  (Social Determinants of Health) assessments and interventions performed:  SDOH Interventions    Flowsheet Row Office Visit from 02/25/2021 in [facility]  SDOH Interventions   Food Insecurity Interventions Intervention Not Indicated  Housing Interventions Intervention Not Indicated  Transportation Interventions Intervention Not Indicated  Financial Strain Interventions Intervention Not Indicated  Physical Activity Interventions Intervention Not Indicated  Stress Interventions Intervention Not Indicated  Social Connections Interventions Intervention Not Indicated       CCM Care Plan  Allergies  Allergen Reactions   Injectafer [Ferric Carboxymaltose] Shortness Of Breath    Mild SOB following injectafer infusion Mild SOB following injectafer infusion    Penicillins Anaphylaxis and Rash    Has patient had a PCN reaction causing immediate rash, facial/tongue/throat swelling, SOB or lightheadedness with  hypotension: Yes Has patient had a PCN reaction causing severe rash involving mucus membranes or skin necrosis: No Has patient had a PCN reaction that required hospitalization: No Has patient had a PCN reaction occurring within the last 10 years: No If all of the above answers are "NO", then may proceed with Cephalosporin use. "Stuttered with medication"     Accupril [Quinapril Hcl] Other (See Comments)    Hyperkalemia=high potassium level   Aleve [Naproxen] Swelling   Celebrex [Celecoxib]     ulcers   Cozaar [Losartan Potassium] Itching   Flonase [Fluticasone] Other (See Comments)    Nasal ulcer.     Levomenol Other (See Comments)      unknown unknown    Lipitor [Atorvastatin] Other (See Comments)    muscle aches/pains   Nasonex [Mometasone Furoate] Other (See Comments)    nosebleed   Xyzal [Levocetirizine] Other (See Comments)    Doesn't recall reaction type   Chamomile Other (See Comments)    unknown   Vesicare [Solifenacin] Other (See Comments)    "stopped me from urinating"   Aspirin Other (See Comments)    Pt states "burns holes in stomach", ulcers    Baking Soda-Fluoride [Sodium Fluoride] Itching and Rash   Clindamycin/Lincomycin Rash   Codeine Nausea And Vomiting   Furosemide Other (See Comments)    Nose bleed   Lactose Intolerance (Gi) Other (See Comments)    Increased calcium, sneezing, nasal issues   Lanolin-Petrolatum Rash   Metformin And Related Diarrhea   Miralax [Polyethylene Glycol] Rash   Quinine Rash   Reclast [Zoledronic Acid] Rash and Other (See Comments)    shaking   Rifampin Other (See Comments)    DOES NOT REMEMBER THIS MEDICATION OR ALLERGY   Sulfa Antibiotics Rash   Sulfonamide Derivatives Rash   Tramadol Rash    Anxiety   Wellbutrin [Bupropion] Other (See Comments)    Muscle aches, pain, "made my skin crawl"   Wool Alcohol [Lanolin] Rash    Medications Reviewed Today     Reviewed by Swinyer, Michelle M, NP (Nurse Practitioner) on  05/22/22 at 0841  Med List Status: <None>   Medication Order Taking? Sig Documenting Provider Last Dose Status Informant  acetaminophen (TYLENOL) 500 MG tablet [id number] Yes Take 500-1,000 mg by mouth every 6 (six) hours as needed (for pain.). [provider] Taking Active Self  albuterol (PROVENTIL) (2.5 MG/3ML) 0.083% nebulizer solution [id number] Yes Take 2.5 mg by nebulization every 6 (six) hours as needed for wheezing or shortness of breath. [provider] Taking Active Self  albuterol (VENTOLIN HFA) 108 (90 Base) MCG/ACT inhaler [id number] Yes Inhale 1-2 puffs into the lungs every 6 (six) hours as needed for wheezing or shortness of breath. [name], [name] D, MD Taking Active Self  alendronate (FOSAMAX) 70 MG tablet [id number] Yes TAKE 1 TABLET BY MOUTH  EVERY 7 DAYS WITH A FULL  GLASS OF WATER ON AN EMPTY  STOMACH [name], [name] D, MD Taking Active Self  AMBULATORY NON FORMULARY MEDICATION [id number] Yes Medication Name: Tubing and supplies for nebulizer. Fax - [phone] [name], [name] D, MD Taking Active Self  amiodarone (PACERONE) 200 MG tablet [id number] Yes Take 0.5 tablets (100 mg total) by mouth daily. Swinyer, Michelle M, NP  Active   B Complex-C (B-COMPLEX WITH VITAMIN C) tablet [id number] Yes Take 1 tablet by mouth in the morning. [provider] Taking Active Self  bumetanide (BUMEX) 1 MG tablet [id number] Yes Take 1 tablet (1 mg total) by mouth 2 (two) times daily. You may take an additional tablet (1 mg) as needed for lower extremity edema Conte, Tessa N, PA-C Taking Active   carvedilol (COREG) 3.125 MG tablet [id number] Yes Take 1 tablet (3.125 mg total) by mouth 2 (two) times daily. Pemberton, Heather E, MD Taking Active   Continuous Blood Gluc Receiver (FREESTYLE LIBRE 2 READER) DEVI [id number] Yes by Does not apply route. [provider] Taking Active Self  Continuous Blood Gluc Sensor (FREESTYLE LIBRE 2 SENSOR) MISC [id number] Yes by  Does not apply route. [provider] Taking Active Self  EPINEPHRINE 0.3 mg/0.3 mL IJ SOAJ injection [id number] Yes INJECT 1 PEN IN THE MUSCLE ONE TIME AS DIRECTED FOR ANAPHYLAXIS.   PLEASE CALL 911 AFTERWARDS Hali Marry, MD Taking Active   fexofenadine [facility]) 180 MG tablet [id number] Yes Take 180 mg by mouth in the morning. [provider] Taking Active Self  fluticasone-salmeterol (ADVAIR HFA) 573-22 MCG/ACT inhaler [id number] Yes Inhale 2 puffs into the lungs 2 (two) times daily. [provider] Taking Active Self  levothyroxine (SYNTHROID) 88 MCG tablet [id number] Yes TAKE 1 TABLET(88 MCG) BY MOUTH DAILY Hali Marry, MD Taking Active   LINZESS 72 MCG capsule [id number] Yes Take 72-144 mcg by mouth 4 (four) times daily as needed (constipation.). [provider] Taking Active Self           Med Note Louretta Shorten, APRIL   Mon Oct 13, 2021 11:02 AM)    montelukast (SINGULAIR) 10 MG tablet [id number] Yes Take by mouth. [provider] Taking Active   Multiple Vitamins-Minerals (OCUVITE EYE + MULTI) TABS [id number] Yes Take 1 tablet by mouth in the morning. [provider] Taking Active Self  pantoprazole (PROTONIX) 40 MG tablet [id number] Yes Take 1 tablet (40 mg total) by mouth daily before breakfast. Hali Marry, MD Taking Active   PAPAYA ENZYME PO [id number] Yes Take 1 capsule by mouth in the morning. [provider] Taking Active Self  pravastatin (PRAVACHOL) 40 MG tablet [id number] Yes TAKE 1 TABLET BY MOUTH AT  BEDTIME Hali Marry, MD Taking Active Self  Probiotic Product ([employer] PROBIOTIC) CAPS [id number] Yes Take 2 capsules by mouth in the morning. [provider] Taking Active Self  Semaglutide,0.25 or 0.5MG/DOS, (OZEMPIC, 0.25 OR 0.5 MG/DOSE,) 2 MG/1.5ML SOPN [id number] Yes Inject 0.25 mg into the skin every Saturday. [provider] Taking Active Self           Med Note Louretta Shorten, APRIL    Mon Oct 13, 2021 11:02 AM)    Tiotropium Bromide Monohydrate (SPIRIVA RESPIMAT) 1.25 MCG/ACT AERS [id number] Yes Inhale 2 puffs into the lungs daily. [provider] Taking Active Self  valACYclovir (VALTREX) 1000 MG tablet [id number] Yes Take 1 tablet (1,000 mg total) by mouth 2 (two) times daily.  Patient taking differently: Take 1,000 mg by mouth 2 (two) times daily as needed (fever blisters/cold sores.).   Hali Marry, MD Taking Active Self            Patient Active Problem List   Diagnosis Date Noted   Stress and adjustment reaction 04/08/2022   Gastroesophageal reflux disease 02/16/2022   Complete heart block ([place]) 10/24/2021   Osteoporosis 02/28/2021   Primary osteoarthritis of left knee 01/14/2021   Irritable bowel syndrome with constipation 09/16/2020   Aortic atherosclerosis ([place]) 06/11/2020   Benign lipomatous neoplasm of skin and subcutaneous tissue of left leg 06/11/2020   Paroxysmal VT ([place]) 02/14/2020   Chronic constipation 10/06/2019   Moderate persistent asthma with exacerbation 08/29/2019   Genetic testing 08/14/2019   Ductal carcinoma in situ (DCIS) of right breast 06/15/2019   Arthralgia 04/11/2019   Malignant tumor of breast ([place]) 03/06/2019   Fatigue 03/06/2019   Status post total shoulder arthroplasty, right 03/10/2018   DDD (degenerative disc disease), cervical 11/19/2017   Peripheral edema 05/19/2017   Microcytic anemia 05/14/2017   Delayed sleep phase syndrome 10/29/2016   Primary osteoarthritis of left ankle 09/24/2016   Gastrointestinal food sensitivity 05/21/2016   History of arthroplasty of right shoulder 02/24/2016   Bilateral foot pain 01/22/2016   NASH (nonalcoholic steatohepatitis) 05/26/2015    Class: Stage 3   Nasal septal perforation 07/11/2014   Controlled  diabetes mellitus type 2 with complications (HCC) 05/25/2014   Physical deconditioning 04/24/2014   Obesity, Class II, BMI 35-39.9, with comorbidity 06/09/2013    OSA (obstructive sleep apnea) 06/08/2013   Chronic systolic heart failure (HCC) 05/23/2013   Asthma, moderate persistent 04/18/2013   Chronic cough 04/18/2013   Breast cancer of upper-inner quadrant of left female breast (HCC) 03/18/2010   Hyperlipidemia 11/07/2009   Fibromyalgia 10/29/2009   Biventricular cardiac pacemaker in situ 10/23/2009   ALKALINE PHOSPHATASE, ELEVATED 10/13/2009   Hypothyroidism 10/03/2009   Benign essential hypertension 07/02/2009   Chronic back pain 07/02/2009    Immunization History  Administered Date(s) Administered   Fluad Quad(high Dose 65+) 04/11/2019, 05/02/2020, 04/18/2021, 04/24/2021   Influenza Split 05/10/2012, 05/04/2014   Influenza Whole 06/14/2009, 05/08/2010, 04/24/2011   Influenza, High Dose Seasonal PF 05/17/2017, 05/24/2018, 04/11/2019, 05/02/2020   Influenza,inj,Quad PF,6+ Mos 04/28/2013, 05/04/2014, 05/06/2015, 05/18/2016   Moderna SARS-COV2 Booster Vaccination 10/29/2021   Moderna Sars-Covid-2 Vaccination 11/01/2019, 11/29/2019, 07/19/2020   Pneumococcal Conjugate-13 05/25/2014   Pneumococcal Polysaccharide-23 11/28/2012, 12/28/2018   Td 08/24/2004, 03/07/2010   Tdap 07/14/2012   Zoster Recombinat (Shingrix) 10/06/2017, 01/16/2020   Zoster, Live 04/24/2011    Conditions to be addressed/monitored: HTN, HLD, and DMII  There are no care plans that you recently modified to display for this patient.        Medication Assistance:  Pending  Patient's preferred pharmacy is:  [id number] - [place], [place] - [address] [place] [address] Phone: [phone] Fax: [phone]  [facility] - [place], [place] - [address] [place] [address] Phone: [phone] Fax: [phone]  [id number] - [place], [place] - [address] [place] [address] Phone: [phone] Fax: [phone]  [facility] - [place], [place] - [address] [place] [address] Phone: [phone] Fax: [phone]   Uses pill box? Yes Pt endorses 100% compliance  Follow Up:  Patient agrees to Care Plan and Follow-up.  Plan: Telephone follow up appointment with care management team member scheduled for:  1 month  Keesha Kline, PharmD Clinical Pharmacist [facility] [phone]     

## 2022-06-17 DIAGNOSIS — K5900 Constipation, unspecified: Secondary | ICD-10-CM | POA: Diagnosis not present

## 2022-06-17 DIAGNOSIS — R197 Diarrhea, unspecified: Secondary | ICD-10-CM | POA: Diagnosis not present

## 2022-06-17 DIAGNOSIS — R140 Abdominal distension (gaseous): Secondary | ICD-10-CM | POA: Diagnosis not present

## 2022-06-17 DIAGNOSIS — R195 Other fecal abnormalities: Secondary | ICD-10-CM | POA: Diagnosis not present

## 2022-06-17 DIAGNOSIS — K921 Melena: Secondary | ICD-10-CM | POA: Diagnosis not present

## 2022-06-17 NOTE — Progress Notes (Signed)
Tanya Hogan has benefited greatly from services  Orders Placed This Encounter  Procedures   AMB Referral to Chronic Care Management Services    Referral Priority:   Routine    Referral Type:   Consultation    Referral Reason:   Chronic Care Management (CCM)    Number of Visits Requested:   1

## 2022-06-17 NOTE — Addendum Note (Signed)
Addended by: Beatrice Lecher D on: 06/17/2022 06:45 PM   Modules accepted: Orders

## 2022-06-18 ENCOUNTER — Other Ambulatory Visit: Payer: Self-pay | Admitting: Family Medicine

## 2022-06-18 ENCOUNTER — Telehealth: Payer: Self-pay | Admitting: Family Medicine

## 2022-06-18 NOTE — Telephone Encounter (Signed)
Patient came into office stating she needs a new rx for diabetic sensor sent in to pharmacy she stated order is ready to be sent out however they're in need of a new rx must be received by 06-23-22. Please contact patient

## 2022-06-19 NOTE — Telephone Encounter (Signed)
Completed RX for patient via [employer] online ordering system. Order submitted to [employer], the DME company who processed her sensors for 2023.   This should cover her for another year, and shipment will deliver to her house.   She also will be contacted via phone (call and/or text) for status updates from the supply company.  Will notify patient.  Larinda Buttery, PharmD Clinical Pharmacist [facility] [phone]

## 2022-06-22 ENCOUNTER — Other Ambulatory Visit: Payer: Self-pay | Admitting: Family Medicine

## 2022-06-22 DIAGNOSIS — I700 Atherosclerosis of aorta: Secondary | ICD-10-CM

## 2022-06-23 ENCOUNTER — Telehealth: Payer: Self-pay

## 2022-06-23 NOTE — Telephone Encounter (Signed)
Tanya Hogan, and Prior Auth for the sensor, patient spoke with drug company ([place]) and they stated they have sent something to here to the office for [facility], please advise, thanks!

## 2022-06-24 DIAGNOSIS — E118 Type 2 diabetes mellitus with unspecified complications: Secondary | ICD-10-CM | POA: Diagnosis not present

## 2022-06-25 NOTE — Progress Notes (Unsigned)
Cardiology Office Note:    Date:  06/25/2022   ID:  Tanya Hogan, DOB 01-Jul-1951, MRN [id number]  PCP:  Hali Marry, MD   [facility] Providers Cardiologist:  Freada Bergeron, MD {   Referring MD: Hali Marry, *    History of Present Illness:    Tanya Hogan is a 71 y.o. female with a hx of chronic systolic heart failure with LVEF 35-40%>20% on TTE 08/2021, NICM, CHB s/p BiV PPM placement s/p recent gent change in 87/5643, nonalcoholic cirrhosis, DMII HTN, HLD, PVD, and breast cancer s/p mastectomy who presents to clinic for follow-up.  Per review of the record, the patient has a known history of NICM with cath in 01/2020 at OSH with normal coronaries. TTE 01/2020 with LVEF 35-40%.  Was admitted in the ED at [facility] in 08/2021 for decompensated HF with BNP 4700. She was started on bumex BID. Repeat TTE with EF 20%. She was started on spiro and jardiance at that time.  Saw Nicholes Rough in 09/2021 where she had low energy. She stopped the spiro with improvement. She was also on ozempic. Has not tolerated ARB due to itching.  She underwent generator change with Dr. Lovena Le in 10/2021.  Was seen in clinic on 12/2021 where she was feeling well with improved oxygen levels. Reported only wearing oxygen for saturation < 90%.  Takes extra Bumex if ankles become puffy.  Echocardiogram revealed LVEF 20 to 25% with no significant changes from prior echo report from [place].    Was seen by Christen Bame, NP where she was doing okay. She was euvolemic on exam and blood pressure was stable. No med changes made.   Today, ***  Past Medical History:  Diagnosis Date   Alkaline phosphatase elevation    Asthma    Breast cancer ([place]) 2011   Carpal tunnel syndrome    Carpal tunnel syndrome    bilateral   CHF (congestive heart failure) (HCC)    Cirrhosis, non-alcoholic (HCC)    Closed fracture of unspecified part of radius (alone)    DDD (degenerative disc  disease), lumbar    Depression    pt denies   Diabetes mellitus without complication ([place])    Enlarged heart    Fibromyalgia    GERD (gastroesophageal reflux disease)    History of kidney stones    HTN (hypertension)    Hypothyroidism    IBS (irritable bowel syndrome)    LBBB (left bundle branch block)    Microcytic anemia 05/14/2017   Pneumonia    Presence of permanent cardiac pacemaker    PVD (peripheral vascular disease) ([place])    2019   Sleep apnea    uses cpap    Past Surgical History:  Procedure Laterality Date   BIV PACEMAKER GENERATOR CHANGEOUT N/A 11/03/2021   Procedure: BIV PACEMAKER GENERATOR CHANGEOUT;  Surgeon: Evans Lance, MD;  Location: [place] [facility];  Service: Cardiovascular;  Laterality: N/A;   BREAST LUMPECTOMY Left 2011   COLONOSCOPY     FOOT SURGERY     KNEE SURGERY     LEAD REVISION/REPAIR N/A 11/03/2021   Procedure: LEAD REVISION/REPAIR;  Surgeon: Evans Lance, MD;  Location: [place] [facility];  Service: Cardiovascular;  Laterality: N/A;   MASTECTOMY Right    05/2019   PACEMAKER GENERATOR CHANGE N/A 08/23/2014   Procedure: PACEMAKER GENERATOR CHANGE;  Surgeon: Evans Lance, MD;  Location: [facility] CATH LAB;  Service: Cardiovascular;  Laterality: N/A;  PACEMAKER PLACEMENT  2009   [place] [facility]   RECTAL SURGERY  1976   REDUCTION MAMMAPLASTY Left    05/2019   TOTAL SHOULDER ARTHROPLASTY Right 03/10/2018   TOTAL SHOULDER ARTHROPLASTY Right 03/10/2018   Procedure: RIGHT TOTAL SHOULDER ARTHROPLASTY;  Surgeon: [name], MD;  Location: [place];  Service: Orthopedics;  Laterality: Right;   TUBAL LIGATION      Current Medications: No outpatient medications have been marked as taking for the 06/29/22 encounter (Appointment) with Freada Bergeron, MD.     Allergies:   Injectafer [ferric carboxymaltose], Penicillins, Accupril [quinapril hcl], Aleve [naproxen], Celebrex [celecoxib], Cozaar [losartan potassium], Flonase [fluticasone],  Levomenol, Lipitor [atorvastatin], Nasonex [mometasone furoate], Xyzal [levocetirizine], Chamomile, Vesicare [solifenacin], Aspirin, Baking soda-fluoride [sodium fluoride], Clindamycin/lincomycin, Codeine, Furosemide, Lactose intolerance (gi), Lanolin-petrolatum, Metformin and related, Miralax [polyethylene glycol], Quinine, Reclast [zoledronic acid], Rifampin, Sulfa antibiotics, Sulfonamide derivatives, Tramadol, Wellbutrin [bupropion], and Wool alcohol [lanolin]   Social History   Socioeconomic History   Marital status: Divorced    Spouse name: Not on file   Number of children: 2   Years of education: 14   Highest education level: Associate degree: academic program  Occupational History   Occupation: quality insurcance in [employer]    Comment: retired  Tobacco Use   Smoking status: Former    Packs/day: 0.00    Years: 0.00    Total pack years: 0.00    Types: Cigarettes    Quit date: 08/24/1966    Years since quitting: 55.8   Smokeless tobacco: Never  Vaping Use   Vaping Use: Never used  Substance and Sexual Activity   Alcohol use: No   Drug use: No   Sexual activity: Not Currently  Other Topics Concern   Not on file  Social History Narrative   Retired. Lives with and helps her brother. Likes Systems developer, Social worker and music. Loves animals.   Social Determinants of Health   Financial Resource Strain: Low Risk  (02/25/2021)   Overall Financial Resource Strain (CARDIA)    Difficulty of Paying Living Expenses: Not hard at all  Food Insecurity: No Food Insecurity (02/25/2021)   Hunger Vital Sign    Worried About Running Out of Food in the Last Year: Never true    Ran Out of Food in the Last Year: Never true  Transportation Needs: No Transportation Needs (02/25/2021)   PRAPARE - Hydrologist (Medical): No    Lack of Transportation (Non-Medical): No  Physical Activity: Inactive (02/25/2021)   Exercise Vital Sign    Days of Exercise per Week:  0 days    Minutes of Exercise per Session: 0 min  Stress: No Stress Concern Present (02/25/2021)   [place]    Feeling of Stress : Not at all  Social Connections: Socially Isolated (02/25/2021)   Social Connection and Isolation Panel [NHANES]    Frequency of Communication with Friends and Family: More than three times a week    Frequency of Social Gatherings with Friends and Family: Once a week    Attends Religious Services: Never    Marine scientist or Organizations: No    Attends Music therapist: Never    Marital Status: Divorced     Family History: The patient's family history includes Breast cancer in an other family member; Heart disease in her brother, father, and mother; Heart failure in an other family member; Hypertension in an other family member;  Melanoma in her mother; Parkinson's disease in her mother.  ROS:   Review of Systems  Constitutional:  Negative for chills and fever.  HENT:  Negative for ear pain and sore throat.   Eyes:  Negative for pain.  Respiratory:  Negative for cough and sputum production.   Cardiovascular:  Negative for chest pain, palpitations, orthopnea, claudication, leg swelling and PND.  Gastrointestinal:  Negative for heartburn and vomiting.  Genitourinary:  Negative for hematuria.  Musculoskeletal:  Negative for back pain.  Neurological:  Negative for speech change and loss of consciousness.  Endo/Heme/Allergies:  Does not bruise/bleed easily.  Psychiatric/Behavioral:  Negative for depression and hallucinations.      EKGs/Labs/Other Studies Reviewed:    The following studies were reviewed today: TTE 01/31/2022: IMPRESSIONS     1. Left ventricular ejection fraction, by estimation, is 20 to 25%. The  left ventricle has severely decreased function. The left ventricle  demonstrates regional wall motion abnormalities (see scoring  diagram/findings for  description). The left  ventricular internal cavity size was mildly to moderately dilated. Left  ventricular diastolic parameters are consistent with Grade I diastolic  dysfunction (impaired relaxation). Elevated left ventricular end-diastolic  pressure.   2. Trivial mitral valve regurgitation.   3. The aortic valve is tricuspid.   4. There is normal pulmonary artery systolic pressure.   Comparison(s): Care Everywhere @ [facility] 09/21/21 EF 20%.   TTE 08/2021 (OSH): Left Ventricle  Left ventricle is mildly dilated. Wall thickness is normal. Systolic function is severely abnormal. EF: 20 %. Wall motion is normal.   Right Ventricle  Right ventricle is mildly dilated. Systolic function is moderately reduced. Abnormal tricuspid annular plane systolic excursion (TAPSE) <1.7 cm.   Left Atrium  Left atrium is mildly dilated.   Right Atrium  Right atrium size is normal.   IVC/SVC  The inferior vena cava demonstrates a diameter of >2.1 cm and collapses >50%; therefore, the right atrial pressure is estimated at 8 mmHg.   Mitral Valve  Mitral valve structure is normal. There is mild regurgitation with a centrally directed jet.   Tricuspid Valve  Tricuspid valve structure is normal. There is trace regurgitation. The right ventricular systolic pressure is mildly elevated (37-49 mmHg).   Aortic Valve  The aortic valve was not well visualized.   Ascending Aorta  The aortic root is normal in size. The ascending aorta is normal in size.   Pericardium  There is no pericardial effusion.   Study Details  A complete echo was performed using complete 2D, color flow Doppler and spectral Doppler. During the study the apical, parasternal and subcostal views were captured. Overall the study quality was adequate. The study was technically difficult. The study was difficult due to patient's clinical status and body habitus.  EKG:  EKG is personally reviewed. 12/22/2021: EKG was not ordered. 11/04/2021:  NSR with ventricular pacing.  Recent Labs: 02/13/2022: Hemoglobin 15.1; Platelets 253 02/16/2022: Brain Natriuretic Peptide 294; TSH 0.93 04/06/2022: ALT 22; BUN 13; Creatinine, Ser 0.94; Potassium 5.3; Sodium 138   Recent Lipid Panel    Component Value Date/Time   CHOL 168 04/16/2021 0000   TRIG 120 04/16/2021 0000   HDL 73 04/16/2021 0000   CHOLHDL 2.3 04/16/2021 0000   VLDL 26 02/18/2017 0939   LDLCALC 74 04/16/2021 0000     Risk Assessment/Calculations:           Physical Exam:    VS:  There were no vitals taken for this visit.  Wt Readings from Last 3 Encounters:  05/21/22 208 lb 3.2 oz (94.4 kg)  04/08/22 207 lb (93.9 kg)  03/03/22 203 lb 9.6 oz (92.4 kg)     GEN: Well nourished, well developed in no acute distress HEENT: Normal NECK: No JVD; No carotid bruits CARDIAC: RRR, no murmurs, rubs, gallops RESPIRATORY:  Clear to auscultation without rales, wheezing or rhonchi  ABDOMEN: Soft, non-tender, non-distended MUSCULOSKELETAL:  No edema; No deformity  SKIN: Warm and dry NEUROLOGIC:  Alert and oriented x 3 PSYCHIATRIC:  Normal affect   ASSESSMENT:    No diagnosis found.  PLAN:    In order of problems listed above:  #Chronic Systolic HF: #NICM: Last TTE at [place] in 08/2021 with LVEF 20%. Cath 01/2020 without obstructive disease. GDMT limited due to allergies/sensitivities. Currently appears euvolemic and compensated with NYHA class II-III symptoms.   -Repeat limited TTE to reassess LVEF -Continue bumex 26m BID with K supplementation -Continue coreg 3.1242mdaily -Off jardiance due to low blood glucoses -Did not tolerate ARB or spironolactone -Low Na diet -Daily weights  #CHB s/p BiV PPM Placement: -Follows with Dr. TaLovena Le#[facility] -Followed by Dr. NaEarly OsmondContinue BiPAP  #HTN: Well controlled and at goal -Continue coreg 3.12575mID  #DMII: -Continue ozempic and jardiance -Management per PCP  #HLD: -Continue pravastatin 75m43mily          Follow-up:   6 months.  Medication Adjustments/Labs and Tests Ordered: Current medicines are reviewed at length with the patient today.  Concerns regarding medicines are outlined above.   No orders of the defined types were placed in this encounter.  No orders of the defined types were placed in this encounter.  There are no Patient Instructions on file for this visit.    I,Mathew Stumpf,acting as a scriEducation administrator HeatFreada Bergeron.,have documented all relevant documentation on the behalf of HeatFreada Bergeron,as directed by  HeatFreada Bergeron while in the presence of HeatFreada Bergeron.  I, HeatFreada Bergeron, have reviewed all documentation for this visit. The documentation on 06/25/22 for the exam, diagnosis, procedures, and orders are all accurate and complete.   Signed, HeatFreada Bergeron  06/25/2022 7:43 PM    [facility]

## 2022-06-26 NOTE — Telephone Encounter (Signed)
I called patient for more information about the company. She states she will call us back with the information.

## 2022-06-26 NOTE — Telephone Encounter (Signed)
Have you seen anything on this?  I have not had anything in my basket.  I been hanging onto this note for couple days thinking that it would show up.

## 2022-06-29 ENCOUNTER — Encounter: Payer: Self-pay | Admitting: Cardiology

## 2022-06-29 ENCOUNTER — Encounter: Payer: Medicare Other | Attending: Cardiology | Admitting: Cardiology

## 2022-06-29 VITALS — BP 124/84 | HR 65 | Ht 61.0 in | Wt 212.2 lb

## 2022-06-29 DIAGNOSIS — I10 Essential (primary) hypertension: Secondary | ICD-10-CM | POA: Diagnosis not present

## 2022-06-29 DIAGNOSIS — I4729 Other ventricular tachycardia: Secondary | ICD-10-CM

## 2022-06-29 DIAGNOSIS — E119 Type 2 diabetes mellitus without complications: Secondary | ICD-10-CM

## 2022-06-29 DIAGNOSIS — I442 Atrioventricular block, complete: Secondary | ICD-10-CM | POA: Diagnosis not present

## 2022-06-29 DIAGNOSIS — I5022 Chronic systolic (congestive) heart failure: Secondary | ICD-10-CM

## 2022-06-29 DIAGNOSIS — I5020 Unspecified systolic (congestive) heart failure: Secondary | ICD-10-CM

## 2022-06-29 DIAGNOSIS — R0602 Shortness of breath: Secondary | ICD-10-CM | POA: Diagnosis not present

## 2022-06-29 MED ORDER — EMPAGLIFLOZIN 10 MG PO TABS
10.0000 mg | ORAL_TABLET | Freq: Every day | ORAL | 4 refills | Status: DC
Start: 2022-06-29 — End: 2022-08-10

## 2022-06-29 NOTE — Progress Notes (Signed)
Cardiology Office Note:    Date:  06/29/2022   ID:  Tanya Hogan, DOB 04/08/1951, MRN [id number]  PCP:  Hali Marry, MD   [facility] Providers Cardiologist:  Freada Bergeron, MD {   Referring MD: Hali Marry, *    History of Present Illness:    Tanya Hogan is a 71 y.o. female with a hx of chronic systolic heart failure with LVEF 35-40%>20% on TTE 08/2021, NICM, CHB s/p BiV PPM placement s/p recent gent change in 62/2297, nonalcoholic cirrhosis, DMII HTN, HLD, PVD, and breast cancer s/p mastectomy who presents to clinic for follow-up.  Per review of the record, the patient has a known history of NICM with cath in 01/2020 at OSH with normal coronaries. TTE 01/2020 with LVEF 35-40%.  Was admitted in the ED at [facility] in 08/2021 for decompensated HF with BNP 4700. She was started on bumex BID. Repeat TTE with EF 20%. She was started on spiro and jardiance at that time.  Saw Nicholes Rough in 09/2021 where she had low energy. She stopped the spiro with improvement. She was also on ozempic. Has not tolerated ARB due to itching.  She underwent generator change with Dr. Lovena Le in 10/2021.  Was seen in clinic on 12/2021 where she was feeling well with improved oxygen levels.  eported only wearing oxygen for saturation < 90%.  Takes extra Bumex if ankles become puffy.  Echocardiogram revealed LVEF 20 to 25% with no significant changes from prior echo report from [place].    Was seen by Christen Bame, NP where she was doing okay. She was euvolemic on exam and blood pressure was stable. No med changes made.   Today, the patient states that she is struggling with constipation and impacted bowels. From yesterday she has noticed weight gain of 5 lbs. She has seen her gastroenterologist. She has tried multiple medications and has bowel movements but still feels very bloated. Her weight has not decreased. After starting her Ozempic, her bowel issues have gradually  worsened but she reports it has been working very well for her diabetes with last A1C 5.5.  Lately she also complains of dyspnea which she attributes to weather changes, asthma, and her bowel issues. No LE edema or orthopnea.  She confirms that her potassium is usually in the high normal range. After taking her diuretic she will eat a banana.  She denies any palpitations, chest pain, or peripheral edema. No lightheadedness, headaches, syncope, orthopnea, or PND.   Past Medical History:  Diagnosis Date   Alkaline phosphatase elevation    Asthma    Breast cancer ([place]) 2011   Carpal tunnel syndrome    Carpal tunnel syndrome    bilateral   CHF (congestive heart failure) (HCC)    Cirrhosis, non-alcoholic (HCC)    Closed fracture of unspecified part of radius (alone)    DDD (degenerative disc disease), lumbar    Depression    pt denies   Diabetes mellitus without complication ([place])    Enlarged heart    Fibromyalgia    GERD (gastroesophageal reflux disease)    History of kidney stones    HTN (hypertension)    Hypothyroidism    IBS (irritable bowel syndrome)    LBBB (left bundle branch block)    Microcytic anemia 05/14/2017   Pneumonia    Presence of permanent cardiac pacemaker    PVD (peripheral vascular disease) ([place])    2019   Sleep apnea    uses  cpap    Past Surgical History:  Procedure Laterality Date   BIV PACEMAKER GENERATOR CHANGEOUT N/A 11/03/2021   Procedure: BIV PACEMAKER GENERATOR CHANGEOUT;  Surgeon: Evans Lance, MD;  Location: [place] [facility];  Service: Cardiovascular;  Laterality: N/A;   BREAST LUMPECTOMY Left 2011   COLONOSCOPY     FOOT SURGERY     KNEE SURGERY     LEAD REVISION/REPAIR N/A 11/03/2021   Procedure: LEAD REVISION/REPAIR;  Surgeon: Evans Lance, MD;  Location: [place] [facility];  Service: Cardiovascular;  Laterality: N/A;   MASTECTOMY Right    05/2019   PACEMAKER GENERATOR CHANGE N/A 08/23/2014   Procedure: PACEMAKER GENERATOR CHANGE;   Surgeon: Evans Lance, MD;  Location: [place] [facility];  Service: Cardiovascular;  Laterality: N/A;   PACEMAKER PLACEMENT  2009   [facility]   RECTAL SURGERY  1976   REDUCTION MAMMAPLASTY Left    05/2019   TOTAL SHOULDER ARTHROPLASTY Right 03/10/2018   TOTAL SHOULDER ARTHROPLASTY Right 03/10/2018   Procedure: RIGHT TOTAL SHOULDER ARTHROPLASTY;  Surgeon: [name], MD;  Location: [place];  Service: Orthopedics;  Laterality: Right;   TUBAL LIGATION      Current Medications: Current Meds  Medication Sig   acetaminophen (TYLENOL) 500 MG tablet Take 500-1,000 mg by mouth every 6 (six) hours as needed (for pain.).   albuterol (PROVENTIL) (2.5 MG/3ML) 0.083% nebulizer solution Take 2.5 mg by nebulization every 6 (six) hours as needed for wheezing or shortness of breath.   albuterol (VENTOLIN HFA) 108 (90 Base) MCG/ACT inhaler Inhale 1-2 puffs into the lungs every 6 (six) hours as needed for wheezing or shortness of breath.   alendronate (FOSAMAX) 70 MG tablet TAKE 1 TABLET BY MOUTH  EVERY 7 DAYS WITH A FULL  GLASS OF WATER ON AN EMPTY  STOMACH   AMBULATORY NON FORMULARY MEDICATION Medication Name: Tubing and supplies for nebulizer. Fax - [phone]   amiodarone (PACERONE) 200 MG tablet Take 0.5 tablets (100 mg total) by mouth daily.   B Complex-C (B-COMPLEX WITH VITAMIN C) tablet Take 1 tablet by mouth in the morning.   bumetanide (BUMEX) 1 MG tablet Take 1 tablet (1 mg total) by mouth 2 (two) times daily. You may take an additional tablet (1 mg) as needed for lower extremity edema   carvedilol (COREG) 3.125 MG tablet Take 1 tablet (3.125 mg total) by mouth 2 (two) times daily.   Continuous Blood Gluc Receiver (FREESTYLE LIBRE 2 READER) DEVI by Does not apply route.   Continuous Blood Gluc Sensor (FREESTYLE LIBRE 2 SENSOR) MISC by Does not apply route.   empagliflozin (JARDIANCE) 10 MG TABS tablet Take 1 tablet (10 mg total) by mouth daily before breakfast.   EPINEPHRINE 0.3 mg/0.3  mL IJ SOAJ injection INJECT 1 PEN IN THE MUSCLE ONE TIME AS DIRECTED FOR ANAPHYLAXIS. PLEASE CALL 911 AFTERWARDS   fexofenadine (ALLEGRA) 180 MG tablet Take 180 mg by mouth in the morning.   fluticasone-salmeterol (ADVAIR HFA) 115-21 MCG/ACT inhaler Inhale 2 puffs into the lungs 2 (two) times daily.   levothyroxine (SYNTHROID) 88 MCG tablet TAKE 1 TABLET(88 MCG) BY MOUTH DAILY   LINZESS 72 MCG capsule Take 72-144 mcg by mouth 4 (four) times daily as needed (constipation.).   Multiple Vitamins-Minerals (OCUVITE EYE + MULTI) TABS Take 1 tablet by mouth in the morning.   pantoprazole (PROTONIX) 40 MG tablet Take 1 tablet (40 mg total) by mouth daily before breakfast.   PAPAYA ENZYME PO Take 1 capsule  by mouth in the morning.   pravastatin (PRAVACHOL) 40 MG tablet TAKE 1 TABLET BY MOUTH AT  BEDTIME   Probiotic Product ([employer] PROBIOTIC) CAPS Take 2 capsules by mouth in the morning.   Semaglutide,0.25 or 0.5MG/DOS, (OZEMPIC, 0.25 OR 0.5 MG/DOSE,) 2 MG/1.5ML SOPN Inject 0.5 mg into the skin every Saturday.   SODIUM FLUORIDE 5000 PLUS 1.1 % CREA dental cream as directed.   Tiotropium Bromide Monohydrate (SPIRIVA RESPIMAT) 1.25 MCG/ACT AERS Inhale 2 puffs into the lungs daily.   valACYclovir (VALTREX) 1000 MG tablet Take 1 tablet (1,000 mg total) by mouth 2 (two) times daily. (Patient taking differently: Take 1,000 mg by mouth as needed (fever blisters/cold sores.).)   [DISCONTINUED] montelukast (SINGULAIR) 10 MG tablet Take by mouth.     Allergies:   Injectafer [ferric carboxymaltose], Penicillins, Accupril [quinapril hcl], Aleve [naproxen], Celebrex [celecoxib], Cozaar [losartan potassium], Flonase [fluticasone], Levomenol, Lipitor [atorvastatin], Nasonex [mometasone furoate], Xyzal [levocetirizine], Chamomile, Vesicare [solifenacin], Aspirin, Baking soda-fluoride [sodium fluoride], Clindamycin/lincomycin, Codeine, Furosemide, Lactose intolerance (gi), Lanolin-petrolatum, Metformin and related, Miralax  [polyethylene glycol], Quinine, Reclast [zoledronic acid], Rifampin, Sulfa antibiotics, Sulfonamide derivatives, Tramadol, Wellbutrin [bupropion], and Wool alcohol [lanolin]   Social History   Socioeconomic History   Marital status: Divorced    Spouse name: Not on file   Number of children: 2   Years of education: 14   Highest education level: Associate degree: academic program  Occupational History   Occupation: quality insurcance in [employer]    Comment: retired  Tobacco Use   Smoking status: Former    Packs/day: 0.00    Years: 0.00    Total pack years: 0.00    Types: Cigarettes    Quit date: 08/24/1966    Years since quitting: 55.8   Smokeless tobacco: Never  Vaping Use   Vaping Use: Never used  Substance and Sexual Activity   Alcohol use: No   Drug use: No   Sexual activity: Not Currently  Other Topics Concern   Not on file  Social History Narrative   Retired. Lives with and helps her brother. Likes Systems developer, Social worker and music. Loves animals.   Social Determinants of Health   Financial Resource Strain: Low Risk  (02/25/2021)   Overall Financial Resource Strain (CARDIA)    Difficulty of Paying Living Expenses: Not hard at all  Food Insecurity: No Food Insecurity (02/25/2021)   Hunger Vital Sign    Worried About Running Out of Food in the Last Year: Never true    Ran Out of Food in the Last Year: Never true  Transportation Needs: No Transportation Needs (02/25/2021)   PRAPARE - Hydrologist (Medical): No    Lack of Transportation (Non-Medical): No  Physical Activity: Inactive (02/25/2021)   Exercise Vital Sign    Days of Exercise per Week: 0 days    Minutes of Exercise per Session: 0 min  Stress: No Stress Concern Present (02/25/2021)   [place]    Feeling of Stress : Not at all  Social Connections: Socially Isolated (02/25/2021)   Social Connection and  Isolation Panel [NHANES]    Frequency of Communication with Friends and Family: More than three times a week    Frequency of Social Gatherings with Friends and Family: Once a week    Attends Religious Services: Never    Marine scientist or Organizations: No    Attends Archivist Meetings: Never    Marital  Status: Divorced     Family History: The patient's family history includes Breast cancer in an other family member; Heart disease in her brother, father, and mother; Heart failure in an other family member; Hypertension in an other family member; Melanoma in her mother; Parkinson's disease in her mother.  ROS:   Review of Systems  Constitutional:  Negative for chills and fever.  HENT:  Negative for ear pain and sore throat.   Eyes:  Negative for pain.  Respiratory:  Positive for shortness of breath. Negative for cough and sputum production.   Cardiovascular:  Negative for chest pain, palpitations, orthopnea, claudication, leg swelling and PND.  Gastrointestinal:  Positive for constipation. Negative for heartburn and vomiting.  Genitourinary:  Negative for hematuria.  Musculoskeletal:  Negative for back pain.  Neurological:  Negative for speech change and loss of consciousness.  Endo/Heme/Allergies:  Does not bruise/bleed easily.  Psychiatric/Behavioral:  Negative for depression and hallucinations.      EKGs/Labs/Other Studies Reviewed:    The following studies were reviewed today:  Echo  01/08/2022: Sonographer Comments: Technically difficult study due to poor echo windows and suboptimal apical window. Image acquisition challenging due to patient body habitus. Definity not utilized due to hypersensitivity  IMPRESSIONS    1. Left ventricular ejection fraction, by estimation, is 20 to 25%. The  left ventricle has severely decreased function. The left ventricle  demonstrates regional wall motion abnormalities (see scoring  diagram/findings for description). The  left  ventricular internal cavity size was mildly to moderately dilated. Left  ventricular diastolic parameters are consistent with Grade I diastolic  dysfunction (impaired relaxation). Elevated left ventricular end-diastolic  pressure.   2. Trivial mitral valve regurgitation.   3. The aortic valve is tricuspid.   4. There is normal pulmonary artery systolic pressure.   Comparison(s): Care Everywhere @ [facility] 09/21/21 EF 20%.   BIV PACEMAKER GENERATOR CHANGEOUT  11/03/2021: Conclusion: Successful insertion of a new left bundle area pacing lead, removal of a previously implanted BiV pacemaker which was approaching elective replacement, and insertion of a new biventricular pacing system utilizing left upper extremity venography in a patient with chronic systolic heart failure class III, and a paced QRS duration of 190 ms.   TTE 08/2021 (OSH): Left Ventricle  Left ventricle is mildly dilated. Wall thickness is normal. Systolic function is severely abnormal. EF: 20 %. Wall motion is normal.   Right Ventricle  Right ventricle is mildly dilated. Systolic function is moderately reduced. Abnormal tricuspid annular plane systolic excursion (TAPSE) <1.7 cm.   Left Atrium  Left atrium is mildly dilated.   Right Atrium  Right atrium size is normal.   IVC/SVC  The inferior vena cava demonstrates a diameter of >2.1 cm and collapses >50%; therefore, the right atrial pressure is estimated at 8 mmHg.   Mitral Valve  Mitral valve structure is normal. There is mild regurgitation with a centrally directed jet.   Tricuspid Valve  Tricuspid valve structure is normal. There is trace regurgitation. The right ventricular systolic pressure is mildly elevated (37-49 mmHg).   Aortic Valve  The aortic valve was not well visualized.   Ascending Aorta  The aortic root is normal in size. The ascending aorta is normal in size.   Pericardium  There is no pericardial effusion.   Study Details  A complete  echo was performed using complete 2D, color flow Doppler and spectral Doppler. During the study the apical, parasternal and subcostal views were captured. Overall the study quality was adequate.  The study was technically difficult. The study was difficult due to patient's clinical status and body habitus.  EKG:  EKG is personally reviewed. 06/29/2022:  EKG was not ordered. 02/20/2022 (Dr. Lovena Le):  NSR with biv pacing. 12/22/2021: EKG was not ordered. 11/04/2021: NSR with ventricular pacing.  Recent Labs: 02/13/2022: Hemoglobin 15.1; Platelets 253 02/16/2022: Brain Natriuretic Peptide 294; TSH 0.93 04/06/2022: ALT 22; BUN 13; Creatinine, Ser 0.94; Potassium 5.3; Sodium 138   Recent Lipid Panel    Component Value Date/Time   CHOL 168 04/16/2021 0000   TRIG 120 04/16/2021 0000   HDL 73 04/16/2021 0000   CHOLHDL 2.3 04/16/2021 0000   VLDL 26 02/18/2017 0939   LDLCALC 74 04/16/2021 0000     Risk Assessment/Calculations:           Physical Exam:    VS:  BP 124/84   Pulse 65   Ht 5' 1"  (1.549 m)   Wt 212 lb 3.2 oz (96.3 kg)   SpO2 97%   BMI 40.09 kg/m     Wt Readings from Last 3 Encounters:  06/29/22 212 lb 3.2 oz (96.3 kg)  05/21/22 208 lb 3.2 oz (94.4 kg)  04/08/22 207 lb (93.9 kg)     GEN: Well nourished, well developed in no acute distress HEENT: Normal NECK: No JVD; No carotid bruits CARDIAC: RRR, no murmrus RESPIRATORY:  Clear to auscultation without rales, wheezing or rhonchi  ABDOMEN: Soft, non-tender, non-distended MUSCULOSKELETAL:  No edema; No deformity  SKIN: Warm and dry NEUROLOGIC:  Alert and oriented x 3 PSYCHIATRIC:  Normal affect   ASSESSMENT:    1. Chronic systolic heart failure (HCC)   2. HFrEF (heart failure with reduced ejection fraction) ([place])   3. NSVT (nonsustained ventricular tachycardia) ([place])   4. Complete heart block ([place])   5. Essential hypertension   6. SOB (shortness of breath)   7. Diabetes mellitus with coincident hypertension ([place])      PLAN:    In order of problems listed above:  #Chronic Systolic HF: #NICM: Last TTE at [facility] in 08/2021 with LVEF 20% which is stable on repeat TTE 12/2021 with EF 20-25% with inferior, basal-to-mid inferoseptal akniesis and lateral and anterior wall hypokinesis, G1DD. Cath 01/2020 without obstructive disease. GDMT limited due to allergies/sensitivities. Currently appears euvolemic and compensated with NYHA class II-III symptoms.   -Continue bumex 51m BID with K supplementation -Continue coreg 3.1270mdaily -Went off jardiance due to low BG with combination with ozempic; given bowel issues, she would like to trial back on jardiance and hold the ozempic to see if her bowel issues improve -Did not tolerate ARB or spironolactone due to hyperkalemia -Low Na diet -Daily weights  #CHB s/p BiV PPM Placement: -Follows with Dr. TaLovena Le#OSA -Followed by Dr. NaEarly OsmondContinue BiPAP  #HTN: Well controlled and at goal -Continue coreg 3.12541mID  #DMII: -Trial on jardiance monotherapy to see if bowel issues improve -Management per PCP  #HLD: -Continue pravastatin 24m55mily  #IBS: #Constipation: -Follow-up with GI as scheduled        Follow-up:   3 months with APP.  Medication Adjustments/Labs and Tests Ordered: Current medicines are reviewed at length with the patient today.  Concerns regarding medicines are outlined above.   No orders of the defined types were placed in this encounter.  Meds ordered this encounter  Medications   empagliflozin (JARDIANCE) 10 MG TABS tablet    Sig: Take 1 tablet (10 mg total) by mouth daily before breakfast.  Dispense:  30 tablet    Refill:  4   Patient Instructions  Medication Instructions:   TRIAL OFF OF OZEMPIC FOR ABOUT A MONTH AND SEE IF YOUR SYMPTOMS IMPROVE OR NOT  START TAKING JARDIANCE 10 MG BY MOUTH DAILY BEFORE BREAKFAST  *If you need a refill on your cardiac medications before your next appointment, please call your  pharmacy*    Follow-Up:  3 MONTHS WITH AN EXTENDER IN THE OFFICE PER DR. Johney Frame   Important Information About Sugar         I,Mathew Stumpf,acting as a scribe for Freada Bergeron, MD.,have documented all relevant documentation on the behalf of Freada Bergeron, MD,as directed by  Freada Bergeron, MD while in the presence of Freada Bergeron, MD.  I, Freada Bergeron, MD, have reviewed all documentation for this visit. The documentation on 06/29/22 for the exam, diagnosis, procedures, and orders are all accurate and complete.   Signed, Freada Bergeron, MD  06/29/2022 9:40 AM    [place]

## 2022-06-29 NOTE — Telephone Encounter (Signed)
Pt called and lvm stating that her sensors were delivered to her home. She had also came by and dropped off the box with this information.

## 2022-06-29 NOTE — Patient Instructions (Signed)
Medication Instructions:   TRIAL OFF OF OZEMPIC FOR ABOUT A MONTH AND SEE IF YOUR SYMPTOMS IMPROVE OR NOT  START TAKING JARDIANCE 10 MG BY MOUTH DAILY BEFORE BREAKFAST  *If you need a refill on your cardiac medications before your next appointment, please call your pharmacy*    Follow-Up:  3 MONTHS WITH AN EXTENDER IN THE OFFICE PER DR. Johney Frame   Important Information About Sugar

## 2022-06-30 ENCOUNTER — Telehealth: Payer: Self-pay

## 2022-06-30 DIAGNOSIS — E1142 Type 2 diabetes mellitus with diabetic polyneuropathy: Secondary | ICD-10-CM | POA: Diagnosis not present

## 2022-06-30 NOTE — Progress Notes (Unsigned)
  Chronic Care Management   Note  07/01/2022 Name: Tanya Hogan MRN: [id number] DOB: 10-Feb-1951  Tanya Hogan is a 71 y.o. year old female who is a primary care patient of Madilyn Fireman, Rene Kocher, MD. I reached out to Tanya Hogan by phone today in response to a referral sent by Tanya Hogan PCP.  Tanya Hogan  agreedto scheduling an appointment with the CCM RN Case Manager   Follow up plan: Patient agreed to scheduled appointment with RN Case Manager on 07/03/2022(date/time).   SIGNATURE Chronic Care Management   Note  06/30/2022 Name: Tanya Hogan MRN: [id number] DOB: [birth year]  Tanya Hogan is a 71 y.o. year old female who is a primary care patient of Madilyn Fireman, Rene Kocher, MD. I reached out to Tanya Hogan by phone today in response to a referral sent by Tanya Hogan PCP.  Tanya Hogan was not successfully contacted today. A HIPAA compliant voice message was left requesting a return call.   Follow up plan: Additional outreach attempts will be made.  Tanya Hogan, [place], [place] [address] Direct Dial: [phone] [email or web address]

## 2022-07-03 ENCOUNTER — Telehealth: Payer: Medicare Other

## 2022-07-03 ENCOUNTER — Ambulatory Visit (INDEPENDENT_AMBULATORY_CARE_PROVIDER_SITE_OTHER): Payer: Medicare Other

## 2022-07-03 DIAGNOSIS — E118 Type 2 diabetes mellitus with unspecified complications: Secondary | ICD-10-CM

## 2022-07-03 DIAGNOSIS — I5022 Chronic systolic (congestive) heart failure: Secondary | ICD-10-CM

## 2022-07-03 NOTE — Patient Instructions (Signed)
Please call the care guide team at [phone] if you need to cancel or reschedule your appointment.   If you are experiencing a Mental Health or [place] or need someone to talk to, please call the Suicide and Crisis Lifeline: 988 call the [place] National Suicide Prevention Lifeline: 732-380-3061 or TTY: 938-385-3607 TTY (510)423-9954) to talk to a trained counselor call 1-800-273-TALK (toll free, 24 hour hotline)   Following is a copy of your full provider care plan:   Goals Addressed             This Visit's Progress    CCM Expected Outcome:  Monitor, Self-Manage and Reduce Symptoms of Diabetes       Current Barriers:  Knowledge Deficits related to constipation and having to change medications to see if constipation is resolved and blood sugar elevations after medication changes Chronic Disease Management support and education needs related to effective management of DM  Planned Interventions: Provided education to patient about basic DM disease process; Reviewed medications with patient and discussed importance of medication adherence;        Reviewed prescribed diet with patient heart healthy/ADA; Counseled on importance of regular laboratory monitoring as prescribed;        Discussed plans with patient for ongoing care management follow up and provided patient with direct contact information for care management team;      Provided patient with written educational materials related to hypo and hyperglycemia and importance of correct treatment;       Reviewed scheduled/upcoming provider appointments including: 08-10-2022 ;         Advised patient, providing education and rationale, to check cbg twice daily and when you have symptoms of low or high blood sugar and record        Referral made to pharmacy team for assistance with PAP for DM medications. Patient is having issues with neuropathy. Has taken gabapentin in the past but does not want to take more medications.  Education and support given. ;       Review of patient status, including review of consultants reports, relevant laboratory and other test results, and medications completed;       Advised patient to discuss changes in DM, questions, or concerns with provider;      Screening for signs and symptoms of depression related to chronic disease state;        Assessed social determinant of health barriers;         Symptom Management: Take medications as prescribed   Attend all scheduled provider appointments Call provider office for new concerns or questions  call the Suicide and Crisis Lifeline: 988 call the [place] National Suicide Prevention Lifeline: 562 676 6679 or TTY: 430 157 8811 TTY [phone]) to talk to a trained counselor call 1-800-273-TALK (toll free, 24 hour hotline) if experiencing a Mental Health or [place]  check feet daily for cuts, sores or redness enter blood sugar readings and medication or insulin into daily log take the blood sugar log to all doctor visits trim toenails straight across manage portion size keep feet up while sitting wash and dry feet carefully every day wear comfortable, cotton socks wear comfortable, well-fitting shoes  Follow Up Plan: Telephone follow up appointment with care management team member scheduled for: 09-04-2022 at 1 pm       CCM Expected Outcome:  Monitor, Self-Manage and Reduce Symptoms of Heart Failure       Current Barriers:  Knowledge Deficits related to the biventricular pacing system and the  generator changes and fully understanding the pacing system and how it works Chronic Disease Management support and education needs related to effective management of HF EF% 20-25 % in May of 2023 Pacemaker generator setting changed in May of 2023  Planned Interventions: Basic overview and discussion of pathophysiology of Heart Failure reviewed Provided education on low sodium diet/ADA Reviewed Heart Failure Action Plan in  depth and provided written copy Assessed need for readable accurate scales in home. Weighs daily. Is having some issues with constipation but it seems to be resolving at this time. Weight yesterday was 211 Provided education about placing scale on hard, flat surface Advised patient to weigh each morning after emptying bladder Discussed importance of daily weight and advised patient to weigh and record daily. The patient is compliant Reviewed role of diuretics in prevention of fluid overload and management of heart failure Discussed the importance of keeping all appointments with provider Provided patient with education about the role of exercise in the management of heart failure Advised patient to discuss changes in HF or other  with provider Screening for signs and symptoms of depression related to chronic disease state  Assessed social determinant of health barriers  Symptom Management: Take medications as prescribed   Attend all scheduled provider appointments Call provider office for new concerns or questions  call the Suicide and Crisis Lifeline: 988 call the [place] National Suicide Prevention Lifeline: 606-668-4077 or TTY: 4378470168 TTY [phone]) to talk to a trained counselor call 1-800-273-TALK (toll free, 24 hour hotline) if experiencing a Mental Health or [place]  call office if I gain more than 2 pounds in one day or 5 pounds in one week keep legs up while sitting track weight in diary use salt in moderation watch for swelling in feet, ankles and legs every day weigh myself daily begin a heart failure diary bring diary to all appointments develop a rescue plan follow rescue plan if symptoms flare-up track symptoms and what helps feel better or worse dress right for the weather, hot or cold  Follow Up Plan: Telephone follow up appointment with care management team member scheduled for: 09-04-2022 at 1 pm          Patient verbalizes  understanding of instructions and care plan provided today and agrees to view in [place]. Active MyChart status and patient understanding of how to access instructions and care plan via MyChart confirmed with patient.     Telephone follow up appointment with care management team member scheduled for: 09-04-2022 at 1 pm

## 2022-07-03 NOTE — Chronic Care Management (AMB) (Signed)
Chronic Care Management Provider Comprehensive Care Plan    07/03/2022 Name: Tanya Hogan MRN: [id number] DOB: August 11, 1951  Referral to Chronic Care Management (CCM) services was placed by Provider:  Dr. Beatrice Lecher on Date: 06-17-2022.  Chronic Condition 1: HF Provider Assessment and Plan Last TTE at [facility] in 08/2021 with LVEF 20% which is stable on repeat TTE 12/2021 with EF 20-25% with inferior, basal-to-mid inferoseptal akniesis and lateral and anterior wall hypokinesis, G1DD. Cath 01/2020 without obstructive disease. GDMT limited due to allergies/sensitivities. Currently appears euvolemic and compensated with NYHA class II-III symptoms.   -Continue bumex 34m BID with K supplementation -Continue coreg 3.1211mdaily -Went off jardiance due to low BG with combination with ozempic; given bowel issues, she would like to trial back on jardiance and hold the ozempic to see if her bowel issues improve -Did not tolerate ARB or spironolactone due to hyperkalemia -Low Na diet -Daily weights   Expected Outcome/Goals Addressed This Visit (Provider CCM goals/Provider Assessment and plan    CCM (HEART FAILURE)  EXPECTED OUTCOME:  MONITOR, SELF-MANAGE AND REDUCE SYMPTOMS OF HEART FAILURE  Symptom Management Condition 1: Take all medications as prescribed Attend all scheduled provider appointments Call provider office for new concerns or questions  call the Suicide and Crisis Lifeline: 988 call the [place] Suicide Prevention Lifeline: 1-818 113 3766r TTY: 1-701-167-37[place](234) [phone] talk to a trained counselor call 1-800-273-TALK (toll free, 24 hour hotline) if experiencing a Mental Health or [place] if I gain more than 2 pounds in one day or 5 pounds in one week keep legs up while sitting track weight in diary use salt in moderation watch for swelling in feet, ankles and legs every day weigh myself daily begin a heart failure  diary develop a rescue plan follow rescue plan if symptoms flare-up track symptoms and what helps feel better or worse dress right for the weather, hot or cold  Chronic Condition 2: DM Provider Assessment and Plan Trial on jardiance monotherapy to see if bowel issues improve  She is exercising daily.   BG via sensors: 106, 132, 110. A1c 5.5   Expected Outcome/Goals Addressed This Visit (Provider CCM goals/Provider Assessment and plan   CCM (DIABETES) EXPECTED OUTCOME: MONITOR, SELF-MANAGE AND REDUCE SYMPTOMS OF DIABETES  Symptom Management Condition 2: Take all medications as prescribed Attend all scheduled provider appointments Work with the social worker to address care coordination needs and will continue to work with the clinical team to address health care and disease management related needs call the Suicide and Crisis Lifeline: 988 call the [place] Su[place] Prevention Lifeline: 1-270-554-8701r TTY: 1-5025390372TY (1(816)324-2449to talk to a trained counselor call 1-800-273-TALK (toll free, 24 hour hotline) if experiencing a Mental Health or [place] daily for cuts, sores or redness enter blood sugar readings and medication or insulin into daily log take the blood sugar log to all doctor visits trim toenails straight across manage portion size keep feet up while sitting wash and dry feet carefully every day wear comfortable, cotton socks wear comfortable, well-fitting shoes  Problem List Patient Active Problem List   Diagnosis Date Noted   Stress and adjustment reaction 04/08/2022   Gastroesophageal reflux disease 02/16/2022   Complete heart block ([facility]/10/2021   Osteoporosis 02/28/2021   Primary osteoarthritis of left knee 01/14/2021   Irritable bowel syndrome with constipation 09/16/2020   Aortic atherosclerosis ([facility]/19/2021   Benign lipomatous neoplasm of skin  and subcutaneous tissue of left leg 06/11/2020   Paroxysmal  VT ([place]) 02/14/2020   Chronic constipation 10/06/2019   Moderate persistent asthma with exacerbation 08/29/2019   Genetic testing 08/14/2019   Ductal carcinoma in situ (DCIS) of right breast 06/15/2019   Arthralgia 04/11/2019   Malignant tumor of breast ([place]) 03/06/2019   Fatigue 03/06/2019   Status post total shoulder arthroplasty, right 03/10/2018   DDD (degenerative disc disease), cervical 11/19/2017   Peripheral edema 05/19/2017   Microcytic anemia 05/14/2017   Delayed sleep phase syndrome 10/29/2016   Primary osteoarthritis of left ankle 09/24/2016   Gastrointestinal food sensitivity 05/21/2016   History of arthroplasty of right shoulder 02/24/2016   Bilateral foot pain 01/22/2016   NASH (nonalcoholic steatohepatitis) 05/26/2015    Class: Stage 3   Nasal septal perforation 07/11/2014   Controlled diabetes mellitus type 2 with complications ([place]) 85/27/7824   Physical deconditioning 04/24/2014   Obesity, Class II, BMI 35-39.9, with comorbidity 06/09/2013   OSA (obstructive sleep apnea) 23/53/6144   Chronic systolic heart failure ([place]) 05/23/2013   Asthma, moderate persistent 04/18/2013   Chronic cough 04/18/2013   Breast cancer of upper-inner quadrant of left female breast ([place]) 03/18/2010   Hyperlipidemia 11/07/2009   Fibromyalgia 10/29/2009   Biventricular cardiac pacemaker in situ 10/23/2009   ALKALINE PHOSPHATASE, ELEVATED 10/13/2009   Hypothyroidism 10/03/2009   Benign essential hypertension 07/02/2009   Chronic back pain 07/02/2009    Medication Management  Current Outpatient Medications:    acetaminophen (TYLENOL) 500 MG tablet, Take 500-1,000 mg by mouth every 6 (six) hours as needed (for pain.)., Disp: , Rfl:    albuterol (PROVENTIL) (2.5 MG/3ML) 0.083% nebulizer solution, Take 2.5 mg by nebulization every 6 (six) hours as needed for wheezing or shortness of breath., Disp: , Rfl:    albuterol (VENTOLIN HFA) 108 (90 Base) MCG/ACT inhaler, Inhale 1-2 puffs into  the lungs every 6 (six) hours as needed for wheezing or shortness of breath., Disp: 54 g, Rfl: 3   alendronate (FOSAMAX) 70 MG tablet, TAKE 1 TABLET BY MOUTH  EVERY 7 DAYS WITH A FULL  GLASS OF WATER ON AN EMPTY  STOMACH, Disp: 12 tablet, Rfl: 3   AMBULATORY NON FORMULARY MEDICATION, Medication Name: Tubing and supplies for nebulizer. Fax [phone], Disp: 1 vial, Rfl: 0   amiodarone (PACERONE) 200 MG tablet, Take 0.5 tablets (100 mg total) by mouth daily., Disp: 45 tablet, Rfl: 3   B Complex-C (B-COMPLEX WITH VITAMIN C) tablet, Take 1 tablet by mouth in the morning., Disp: , Rfl:    bumetanide (BUMEX) 1 MG tablet, Take 1 tablet (1 mg total) by mouth 2 (two) times daily. You may take an additional tablet (1 mg) as needed for lower extremity edema, Disp: 225 tablet, Rfl: 3   carvedilol (COREG) 3.125 MG tablet, Take 1 tablet (3.125 mg total) by mouth 2 (two) times daily., Disp: 180 tablet, Rfl: 3   Continuous Blood Gluc Receiver (FREESTYLE LIBRE 2 READER) DEVI, by Does not apply route., Disp: , Rfl:    Continuous Blood Gluc Sensor (FREESTYLE LIBRE 2 SENSOR) MISC, by Does not apply route., Disp: , Rfl:    empagliflozin (JARDIANCE) 10 MG TABS tablet, Take 1 tablet (10 mg total) by mouth daily before breakfast., Disp: 30 tablet, Rfl: 4   EPINEPHRINE 0.3 mg/0.3 mL IJ SOAJ injection, INJECT 1 PEN IN THE MUSCLE ONE TIME AS DIRECTED FOR ANAPHYLAXIS. PLEASE CALL 911 AFTERWARDS, Disp: 2 each, Rfl: 1   fexofenadine (ALLEGRA) 180 MG tablet,  Take 180 mg by mouth in the morning., Disp: , Rfl:    fluticasone-salmeterol (ADVAIR HFA) 115-21 MCG/ACT inhaler, Inhale 2 puffs into the lungs 2 (two) times daily., Disp: , Rfl:    levothyroxine (SYNTHROID) 88 MCG tablet, TAKE 1 TABLET(88 MCG) BY MOUTH DAILY, Disp: 90 tablet, Rfl: 1   LINZESS 72 MCG capsule, Take 72-144 mcg by mouth 4 (four) times daily as needed (constipation.)., Disp: , Rfl:    Multiple Vitamins-Minerals (OCUVITE EYE + MULTI) TABS, Take 1 tablet by  mouth in the morning., Disp: , Rfl:    pantoprazole (PROTONIX) 40 MG tablet, Take 1 tablet (40 mg total) by mouth daily before breakfast., Disp: 90 tablet, Rfl: 0   PAPAYA ENZYME PO, Take 1 capsule by mouth in the morning., Disp: , Rfl:    pravastatin (PRAVACHOL) 40 MG tablet, TAKE 1 TABLET BY MOUTH AT  BEDTIME, Disp: 90 tablet, Rfl: 3   Probiotic Product ([employer] PROBIOTIC) CAPS, Take 2 capsules by mouth in the morning., Disp: , Rfl:    Semaglutide,0.25 or 0.5MG/DOS, (OZEMPIC, 0.25 OR 0.5 MG/DOSE,) 2 MG/1.5ML SOPN, Inject 0.5 mg into the skin every Saturday., Disp: , Rfl:    SODIUM FLUORIDE 5000 PLUS 1.1 % CREA dental cream, as directed., Disp: , Rfl:    Tiotropium Bromide Monohydrate (SPIRIVA RESPIMAT) 1.25 MCG/ACT AERS, Inhale 2 puffs into the lungs daily., Disp: , Rfl:    valACYclovir (VALTREX) 1000 MG tablet, Take 1 tablet (1,000 mg total) by mouth 2 (two) times daily. (Patient taking differently: Take 1,000 mg by mouth as needed (fever blisters/cold sores.).), Disp: 14 tablet, Rfl: 2  Cognitive Assessment Identity Confirmed: : Name; DOB Cognitive Status: Normal Other:  : N/A   Functional Assessment Hearing Difficulty or Deaf: no Wear Glasses or Blind: yes Vision Management: wears glasses for reading, Surgery December 2022 and only uses reading glasses Concentrating, Remembering or Making Decisions Difficulty (CP): no Difficulty Communicating: no Difficulty Eating/Swallowing: no Walking or Climbing Stairs Difficulty: yes Walking or Climbing Stairs: ambulation difficulty, requires equipment Mobility Management: uses a Retter when ambulating Dressing/Bathing Difficulty: no Doing Errands Independently Difficulty (such as shopping) (CP): no Change in Functional Status Since Onset of Current Illness/Injury: no   Caregiver Assessment  Primary Source of Support/Comfort: child(ren) Name of Support/Comfort Primary Source: Shevy Yaney- daughter People in Home: sibling(s) (brother lives  with her) Name(s) of People in Home: Brother- [place] if Needed: child(ren), adult Family Caregiver Names: Sharyn Lull- daughter Primary Roles/Responsibilities: retired Expected Impact of Illness/Hospitalization: N/A Concerns About Impact on Relationships: N/A   Planned Interventions  Provided education to patient about basic DM disease process; Reviewed medications with patient and discussed importance of medication adherence;        Reviewed prescribed diet with patient heart healthy/ADA; Counseled on importance of regular laboratory monitoring as prescribed;        Discussed plans with patient for ongoing care management follow up and provided patient with direct contact information for care management team;      Provided patient with written educational materials related to hypo and hyperglycemia and importance of correct treatment;       Reviewed scheduled/upcoming provider appointments including: 08-10-2022 ;         Advised patient, providing education and rationale, to check cbg twice daily and when you have symptoms of low or high blood sugar and record        Referral made to pharmacy team for assistance with PAP for DM medications. Patient is having issues with  neuropathy. Has taken gabapentin in the past but does not want to take more medications. Education and support given. ;       Review of patient status, including review of consultants reports, relevant laboratory and other test results, and medications completed;       Advised patient to discuss changes in DM, questions, or concerns with provider;      Screening for signs and symptoms of depression related to chronic disease state;        Assessed social determinant of health barriers;        Basic overview and discussion of pathophysiology of Heart Failure reviewed Provided education on low sodium diet/ADA Reviewed Heart Failure Action Plan in depth and provided written copy Assessed need for readable accurate  scales in home. Weighs daily. Is having some issues with constipation but it seems to be resolving at this time. Weight yesterday was 211 Provided education about placing scale on hard, flat surface Advised patient to weigh each morning after emptying bladder Discussed importance of daily weight and advised patient to weigh and record daily. The patient is compliant Reviewed role of diuretics in prevention of fluid overload and management of heart failure Discussed the importance of keeping all appointments with provider Provided patient with education about the role of exercise in the management of heart failure Advised patient to discuss changes in HF or other  with provider Screening for signs and symptoms of depression related to chronic disease state  Assessed social determinant of health barriers    Interaction and coordination with outside resources, practitioners, and providers See CCM Referral  Care Plan: Available in MyChart

## 2022-07-03 NOTE — Chronic Care Management (AMB) (Signed)
Chronic Care Management   CCM RN Visit Note  07/03/2022 Name: Tanya Hogan MRN: [id number] DOB: 1951/07/30  Subjective: Tanya Hogan is a 71 y.o. year old female who is a primary care patient of [name], Rene Kocher, MD. The patient was referred to the Chronic Care Management team for assistance with care management needs subsequent to provider initiation of CCM services and plan of care.    Today's Visit:  Engaged with patient by telephone for initial visit.     SDOH Interventions Today    Flowsheet Row Most Recent Value  SDOH Interventions   Food Insecurity Interventions Intervention Not Indicated  Housing Interventions Intervention Not Indicated  Transportation Interventions Intervention Not Indicated  Utilities Interventions Intervention Not Indicated  Alcohol Usage Interventions Intervention Not Indicated (Score <7)  Financial Strain Interventions Intervention Not Indicated  Physical Activity Interventions Intervention Not Indicated  [goes to the [facility] and takes classes almost daily]  Stress Interventions Intervention Not Indicated  Social Connections Interventions Intervention Not Indicated, Other (Comment)  [has good support system and church friends]         Goals Addressed             This Visit's Progress    CCM Expected Outcome:  Monitor, Self-Manage and Reduce Symptoms of Diabetes       Current Barriers:  Knowledge Deficits related to constipation and having to change medications to see if constipation is resolved and blood sugar elevations after medication changes Chronic Disease Management support and education needs related to effective management of DM  Planned Interventions: Provided education to patient about basic DM disease process; Reviewed medications with patient and discussed importance of medication adherence;        Reviewed prescribed diet with patient heart healthy/ADA; Counseled on importance of regular laboratory monitoring as  prescribed;        Discussed plans with patient for ongoing care management follow up and provided patient with direct contact information for care management team;      Provided patient with written educational materials related to hypo and hyperglycemia and importance of correct treatment;       Reviewed scheduled/upcoming provider appointments including: 08-10-2022 ;         Advised patient, providing education and rationale, to check cbg twice daily and when you have symptoms of low or high blood sugar and record        Referral made to pharmacy team for assistance with PAP for DM medications. Patient is having issues with neuropathy. Has taken gabapentin in the past but does not want to take more medications. Education and support given. ;       Review of patient status, including review of consultants reports, relevant laboratory and other test results, and medications completed;       Advised patient to discuss changes in DM, questions, or concerns with provider;      Screening for signs and symptoms of depression related to chronic disease state;        Assessed social determinant of health barriers;         Symptom Management: Take medications as prescribed   Attend all scheduled provider appointments Call provider office for new concerns or questions  call the Suicide and Crisis Lifeline: 988 call the [place] National Suicide Prevention Lifeline: 865-219-6761 or TTY: (772)312-5507 TTY 367-549-3292) to talk to a trained counselor call 1-800-273-TALK (toll free, 24 hour hotline) if experiencing a Mental Health or [place]  check feet daily for  cuts, sores or redness enter blood sugar readings and medication or insulin into daily log take the blood sugar log to all doctor visits trim toenails straight across manage portion size keep feet up while sitting wash and dry feet carefully every day wear comfortable, cotton socks wear comfortable, well-fitting shoes  Follow  Up Plan: Telephone follow up appointment with care management team member scheduled for: 09-04-2022 at 1 pm       CCM Expected Outcome:  Monitor, Self-Manage and Reduce Symptoms of Heart Failure       Current Barriers:  Knowledge Deficits related to the biventricular pacing system and the generator changes and fully understanding the pacing system and how it works Chronic Disease Management support and education needs related to effective management of HF EF% 20-25 % in May of 2023 Pacemaker generator setting changed in May of 2023  Planned Interventions: Basic overview and discussion of pathophysiology of Heart Failure reviewed Provided education on low sodium diet/ADA Reviewed Heart Failure Action Plan in depth and provided written copy Assessed need for readable accurate scales in home. Weighs daily. Is having some issues with constipation but it seems to be resolving at this time. Weight yesterday was 211 Provided education about placing scale on hard, flat surface Advised patient to weigh each morning after emptying bladder Discussed importance of daily weight and advised patient to weigh and record daily. The patient is compliant Reviewed role of diuretics in prevention of fluid overload and management of heart failure Discussed the importance of keeping all appointments with provider Provided patient with education about the role of exercise in the management of heart failure Advised patient to discuss changes in HF or other  with provider Screening for signs and symptoms of depression related to chronic disease state  Assessed social determinant of health barriers  Symptom Management: Take medications as prescribed   Attend all scheduled provider appointments Call provider office for new concerns or questions  call the Suicide and Crisis Lifeline: 988 call the [place] National Suicide Prevention Lifeline: 609-161-5686 or TTY: 432-876-5897 TTY (318) 127-8609) to talk to a trained  counselor call 1-800-273-TALK (toll free, 24 hour hotline) if experiencing a Mental Health or [place]  call office if I gain more than 2 pounds in one day or 5 pounds in one week keep legs up while sitting track weight in diary use salt in moderation watch for swelling in feet, ankles and legs every day weigh myself daily begin a heart failure diary bring diary to all appointments develop a rescue plan follow rescue plan if symptoms flare-up track symptoms and what helps feel better or worse dress right for the weather, hot or cold  Follow Up Plan: Telephone follow up appointment with care management team member scheduled for: 09-04-2022 at 1 pm          Plan:Telephone follow up appointment with care management team member scheduled for:  09-04-2022 at 1 pm  Noreene Larsson RN, MSN, CCM RN Care Manager  Chronic Care Management Direct Number: [phone]

## 2022-07-05 DIAGNOSIS — E739 Lactose intolerance, unspecified: Secondary | ICD-10-CM | POA: Diagnosis not present

## 2022-07-05 DIAGNOSIS — G4733 Obstructive sleep apnea (adult) (pediatric): Secondary | ICD-10-CM | POA: Diagnosis not present

## 2022-07-05 DIAGNOSIS — Z881 Allergy status to other antibiotic agents status: Secondary | ICD-10-CM | POA: Diagnosis not present

## 2022-07-05 DIAGNOSIS — Z886 Allergy status to analgesic agent status: Secondary | ICD-10-CM | POA: Diagnosis not present

## 2022-07-05 DIAGNOSIS — J449 Chronic obstructive pulmonary disease, unspecified: Secondary | ICD-10-CM | POA: Diagnosis not present

## 2022-07-05 DIAGNOSIS — S20211A Contusion of right front wall of thorax, initial encounter: Secondary | ICD-10-CM | POA: Diagnosis not present

## 2022-07-05 DIAGNOSIS — Z79899 Other long term (current) drug therapy: Secondary | ICD-10-CM | POA: Diagnosis not present

## 2022-07-05 DIAGNOSIS — Z7983 Long term (current) use of bisphosphonates: Secondary | ICD-10-CM | POA: Diagnosis not present

## 2022-07-05 DIAGNOSIS — Z950 Presence of cardiac pacemaker: Secondary | ICD-10-CM | POA: Diagnosis not present

## 2022-07-05 DIAGNOSIS — Z882 Allergy status to sulfonamides status: Secondary | ICD-10-CM | POA: Diagnosis not present

## 2022-07-05 DIAGNOSIS — Z853 Personal history of malignant neoplasm of breast: Secondary | ICD-10-CM | POA: Diagnosis not present

## 2022-07-05 DIAGNOSIS — Z888 Allergy status to other drugs, medicaments and biological substances status: Secondary | ICD-10-CM | POA: Diagnosis not present

## 2022-07-05 DIAGNOSIS — Z8616 Personal history of COVID-19: Secondary | ICD-10-CM | POA: Diagnosis not present

## 2022-07-05 DIAGNOSIS — I509 Heart failure, unspecified: Secondary | ICD-10-CM | POA: Diagnosis not present

## 2022-07-05 DIAGNOSIS — Z885 Allergy status to narcotic agent status: Secondary | ICD-10-CM | POA: Diagnosis not present

## 2022-07-05 DIAGNOSIS — Z880 Allergy status to penicillin: Secondary | ICD-10-CM | POA: Diagnosis not present

## 2022-07-05 DIAGNOSIS — I110 Hypertensive heart disease with heart failure: Secondary | ICD-10-CM | POA: Diagnosis not present

## 2022-07-05 DIAGNOSIS — K219 Gastro-esophageal reflux disease without esophagitis: Secondary | ICD-10-CM | POA: Diagnosis not present

## 2022-07-05 DIAGNOSIS — Z91041 Radiographic dye allergy status: Secondary | ICD-10-CM | POA: Diagnosis not present

## 2022-07-05 DIAGNOSIS — Z7984 Long term (current) use of oral hypoglycemic drugs: Secondary | ICD-10-CM | POA: Diagnosis not present

## 2022-07-05 DIAGNOSIS — Z9989 Dependence on other enabling machines and devices: Secondary | ICD-10-CM | POA: Diagnosis not present

## 2022-07-05 DIAGNOSIS — Z87891 Personal history of nicotine dependence: Secondary | ICD-10-CM | POA: Diagnosis not present

## 2022-07-05 DIAGNOSIS — S299XXA Unspecified injury of thorax, initial encounter: Secondary | ICD-10-CM | POA: Diagnosis not present

## 2022-07-06 ENCOUNTER — Encounter: Payer: Self-pay | Admitting: Hematology and Oncology

## 2022-07-07 ENCOUNTER — Encounter: Payer: Self-pay | Admitting: Hematology and Oncology

## 2022-07-07 ENCOUNTER — Encounter: Payer: Self-pay | Admitting: Family Medicine

## 2022-07-07 ENCOUNTER — Ambulatory Visit (INDEPENDENT_AMBULATORY_CARE_PROVIDER_SITE_OTHER): Payer: Medicare Other | Admitting: Family Medicine

## 2022-07-07 VITALS — BP 136/62 | HR 66 | Ht 61.0 in | Wt 212.0 lb

## 2022-07-07 DIAGNOSIS — R0789 Other chest pain: Secondary | ICD-10-CM

## 2022-07-07 DIAGNOSIS — E118 Type 2 diabetes mellitus with unspecified complications: Secondary | ICD-10-CM

## 2022-07-07 DIAGNOSIS — R632 Polyphagia: Secondary | ICD-10-CM | POA: Diagnosis not present

## 2022-07-07 DIAGNOSIS — W010XXA Fall on same level from slipping, tripping and stumbling without subsequent striking against object, initial encounter: Secondary | ICD-10-CM

## 2022-07-07 DIAGNOSIS — M542 Cervicalgia: Secondary | ICD-10-CM | POA: Diagnosis not present

## 2022-07-07 DIAGNOSIS — R0781 Pleurodynia: Secondary | ICD-10-CM

## 2022-07-07 NOTE — Progress Notes (Signed)
Acute Office Visit  Subjective:     Patient ID: Tanya Hogan, female    DOB: Jan 09, 1951, 71 y.o.   MRN: [id number]  Chief Complaint  Patient presents with   Fall    HPI Patient is in today for follow-up on a fall that actually occurred on Sunday.  She was walking her dog when she tripped and fell.  Her right arm was outstretched and she landed on that arm and on her right side of her chest just below the axilla.  She has been sore and having pain and tenderness there.  She did go to the emergency department to be evaluated 2 days ago on November 12 and had a negative work-up.  Was told she did not have a rib fracture but she is still having a lot of soreness.  She is also having some pain on the right side of her neck in the trapezius area going towards the shoulder.  Diabetes-she is also really been struggling with increased appetite.  She does not know why.  It started before she started the Jardiance.  She was taking the Jardiance a little bit on and off because she read on the box that she should not fast and she had been fasting for short periods because of her constipation.  ROS      Objective:    BP 136/62   Pulse 66   Ht 5' 1"  (1.549 m)   Wt 212 lb (96.2 kg)   SpO2 97%   BMI 40.06 kg/m    Physical Exam Vitals reviewed.  Constitutional:      Appearance: She is well-developed.  HENT:     Head: Normocephalic and atraumatic.  Eyes:     Conjunctiva/sclera: Conjunctivae normal.  Cardiovascular:     Rate and Rhythm: Normal rate.  Pulmonary:     Effort: Pulmonary effort is normal.  Musculoskeletal:     Comments: Pain with normal flexion and of the cervical spine.  Normal extension.  Slight discomfort with rotation to the right.  But symmetric rotation.  Right shoulder with normal range of motion.  Nontender over the elbow wrist and shoulder.  No bruising over the right ribs or axilla.  But she is tender over the area just below the axilla laterally over the ribs.   Skin:    General: Skin is dry.     Coloration: Skin is not pale.  Neurological:     Mental Status: She is alert and oriented to person, place, and time.  Psychiatric:        Behavior: Behavior normal.     No results found for any visits on 07/07/22.      Assessment & Plan:   Problem List Items Addressed This Visit       Endocrine   Controlled diabetes mellitus type 2 with complications ([place])    We will get an updated A1c today.  She is off of the semaglutide and is now just taking the Jardiance.  The increased hunger was before she started the [place].  We also discussed that it is okay to continue to take the Jardiance even if she does fast for a few hours in the morning.  Its not that she is fasting all day.  She would just need to monitor for any hypoglycemia.  Her last A1c looked great at 6.1.        Relevant Orders   Hemoglobin A1c   Other Visit Diagnoses     Fall on same  level from slipping, tripping or stumbling, initial encounter    -  Primary   Rib pain       Neck pain on right side       Increased appetite       Relevant Orders   TSH       Recent fall with contusion to the right axilla and arm-recommend continue with gentle stretches and range of motion using ice and/or heat whichever feels better.  If not continuing to improve please let me know I think it is okay to for her to return to the gym on Monday.  Right-sided neck pain-continue with again gentle stretches.  Okay to return to the gym next week if she is feeling better.  Can use heat or ice whichever feels better.  Increased appetite and abnormal weight gain we will go ahead and check TSH.  She feels like the weight gain was starting while she was still in the semaglutide.  No orders of the defined types were placed in this encounter.   No follow-ups on file.  Beatrice Lecher, MD

## 2022-07-07 NOTE — Assessment & Plan Note (Signed)
We will get an updated A1c today.  She is off of the semaglutide and is now just taking the Jardiance.  The increased hunger was before she started the [place].  We also discussed that it is okay to continue to take the Jardiance even if she does fast for a few hours in the morning.  Its not that she is fasting all day.  She would just need to monitor for any hypoglycemia.  Her last A1c looked great at 6.1.

## 2022-07-08 LAB — HEMOGLOBIN A1C
Hgb A1c MFr Bld: 5.9 % of total Hgb — ABNORMAL HIGH (ref <5.7)
Mean Plasma Glucose: 123 mg/dL
eAG (mmol/L): 6.8 mmol/L

## 2022-07-08 LAB — TSH: TSH: 2.78 mIU/L (ref 0.40–4.50)

## 2022-07-08 NOTE — Progress Notes (Signed)
Hi Rashi your A1C look good this time!!!!  Thyroid looks great!!!

## 2022-07-10 ENCOUNTER — Other Ambulatory Visit: Payer: Self-pay | Admitting: Neurology

## 2022-07-10 MED ORDER — FREESTYLE LIBRE 2 SENSOR MISC: 1.0000 | 1 refills | Status: DC

## 2022-07-11 ENCOUNTER — Encounter: Payer: Self-pay | Admitting: Cardiology

## 2022-07-13 ENCOUNTER — Other Ambulatory Visit: Payer: Self-pay

## 2022-07-13 ENCOUNTER — Encounter: Payer: Self-pay | Admitting: Cardiology

## 2022-07-13 ENCOUNTER — Emergency Department (HOSPITAL_BASED_OUTPATIENT_CLINIC_OR_DEPARTMENT_OTHER)
Admission: EM | Admit: 2022-07-13 | Discharge: 2022-07-13 | Disposition: A | Discharge status: Home / Self Care | Payer: Medicare Other | Attending: Emergency Medicine | Admitting: Emergency Medicine

## 2022-07-13 ENCOUNTER — Emergency Department (HOSPITAL_BASED_OUTPATIENT_CLINIC_OR_DEPARTMENT_OTHER): Payer: Medicare Other

## 2022-07-13 DIAGNOSIS — R0789 Other chest pain: Secondary | ICD-10-CM | POA: Insufficient documentation

## 2022-07-13 DIAGNOSIS — W19XXXD Unspecified fall, subsequent encounter: Secondary | ICD-10-CM | POA: Diagnosis not present

## 2022-07-13 DIAGNOSIS — E119 Type 2 diabetes mellitus without complications: Secondary | ICD-10-CM | POA: Insufficient documentation

## 2022-07-13 DIAGNOSIS — R609 Edema, unspecified: Secondary | ICD-10-CM | POA: Diagnosis not present

## 2022-07-13 DIAGNOSIS — I517 Cardiomegaly: Secondary | ICD-10-CM | POA: Diagnosis not present

## 2022-07-13 DIAGNOSIS — R0602 Shortness of breath: Secondary | ICD-10-CM | POA: Diagnosis not present

## 2022-07-13 DIAGNOSIS — Z7984 Long term (current) use of oral hypoglycemic drugs: Secondary | ICD-10-CM | POA: Diagnosis not present

## 2022-07-13 DIAGNOSIS — Z79899 Other long term (current) drug therapy: Secondary | ICD-10-CM | POA: Diagnosis not present

## 2022-07-13 DIAGNOSIS — I2510 Atherosclerotic heart disease of native coronary artery without angina pectoris: Secondary | ICD-10-CM | POA: Insufficient documentation

## 2022-07-13 DIAGNOSIS — R079 Chest pain, unspecified: Secondary | ICD-10-CM | POA: Diagnosis not present

## 2022-07-13 DIAGNOSIS — Z950 Presence of cardiac pacemaker: Secondary | ICD-10-CM | POA: Diagnosis not present

## 2022-07-13 LAB — CBC WITH DIFFERENTIAL/PLATELET
Abs Immature Granulocytes: 0.02 10*3/uL (ref 0.00–0.07)
Basophils Absolute: 0 10*3/uL (ref 0.0–0.1)
Basophils Relative: 1 %
Eosinophils Absolute: 0.2 10*3/uL (ref 0.0–0.5)
Eosinophils Relative: 2 %
HCT: 43.6 % (ref 36.0–46.0)
Hemoglobin: 14 g/dL (ref 12.0–15.0)
Immature Granulocytes: 0 %
Lymphocytes Relative: 22 %
Lymphs Abs: 1.7 10*3/uL (ref 0.7–4.0)
MCH: 31.5 pg (ref 26.0–34.0)
MCHC: 32.1 g/dL (ref 30.0–36.0)
MCV: 98 fL (ref 80.0–100.0)
Monocytes Absolute: 0.6 10*3/uL (ref 0.1–1.0)
Monocytes Relative: 8 %
Neutro Abs: 5.1 10*3/uL (ref 1.7–7.7)
Neutrophils Relative %: 67 %
Platelets: 237 10*3/uL (ref 150–400)
RBC: 4.45 MIL/uL (ref 3.87–5.11)
RDW: 13 % (ref 11.5–15.5)
WBC: 7.6 10*3/uL (ref 4.0–10.5)
nRBC: 0 % (ref 0.0–0.2)

## 2022-07-13 LAB — BRAIN NATRIURETIC PEPTIDE: B Natriuretic Peptide: 530.7 pg/mL — ABNORMAL HIGH (ref 0.0–100.0)

## 2022-07-13 LAB — COMPREHENSIVE METABOLIC PANEL
ALT: 31 U/L (ref 0–44)
AST: 34 U/L (ref 15–41)
Albumin: 3.7 g/dL (ref 3.5–5.0)
Alkaline Phosphatase: 132 U/L — ABNORMAL HIGH (ref 38–126)
Anion gap: 5 (ref 5–15)
BUN: 17 mg/dL (ref 8–23)
CO2: 24 mmol/L (ref 22–32)
Calcium: 8.6 mg/dL — ABNORMAL LOW (ref 8.9–10.3)
Chloride: 110 mmol/L (ref 98–111)
Creatinine, Ser: 0.82 mg/dL (ref 0.44–1.00)
GFR, Estimated: >60 mL/min (ref >60)
Glucose, Bld: 132 mg/dL — ABNORMAL HIGH (ref 70–99)
Potassium: 4.8 mmol/L (ref 3.5–5.1)
Sodium: 139 mmol/L (ref 135–145)
Total Bilirubin: 0.3 mg/dL (ref 0.3–1.2)
Total Protein: 7.1 g/dL (ref 6.5–8.1)

## 2022-07-13 LAB — URINALYSIS, MICROSCOPIC (REFLEX)

## 2022-07-13 LAB — URINALYSIS, ROUTINE W REFLEX MICROSCOPIC
Bilirubin Urine: NEGATIVE
Glucose, UA: >=500 mg/dL — AB
Hgb urine dipstick: NEGATIVE
Ketones, ur: NEGATIVE mg/dL
Leukocytes,Ua: NEGATIVE
Nitrite: NEGATIVE
Protein, ur: NEGATIVE mg/dL
Specific Gravity, Urine: 1.025 (ref 1.005–1.030)
pH: 7 (ref 5.0–8.0)

## 2022-07-13 LAB — TROPONIN I (HIGH SENSITIVITY): Troponin I (High Sensitivity): 17 ng/L (ref <18)

## 2022-07-13 MED ORDER — FENTANYL CITRATE PF 50 MCG/ML IJ SOSY
50.0000 ug | PREFILLED_SYRINGE | Freq: Once | INTRAMUSCULAR | Status: AC
  Administered 2022-07-13: 50 ug via INTRAVENOUS
  Filled 2022-07-13: qty 1

## 2022-07-13 MED ORDER — OXYCODONE-ACETAMINOPHEN 5-325 MG PO TABS: 1.0000 | ORAL_TABLET | Freq: Three times a day (TID) | ORAL | 0 refills | Status: DC | PRN

## 2022-07-13 NOTE — Telephone Encounter (Signed)
RE: Weight gain Received: Today Freada Bergeron, MD  Nuala Alpha, LPN Can we call her and get her an appointment.  If not, we can at least do BMET and BNP and see where fluid levels are.         Called the pt to assist in getting her an appt with our office, as indicated above.  Pt states she just left the ER for complaints and is currently just getting at home.  She states the ER got her squared away and has arranged all her follow-up appts with her care team.  She states she will be following up with her PCP soon for ER visit and this was confirmed on her discharge instructions.   Pt states she would like to hold off on getting in with our office at this time, being she has several appts with her care team scheduled for ER visit and recent fall.  She said she will call our office soon to get an appt scheduled with [place], but from a cardiac perspective she said she is doing ok and just wants to rest in her bed since being at the ER all night.   Pt aware I will let Dr. Johney Frame know this and please feel free to reach out to [place] at any time if needed.   Pt verbalized understanding and agrees with his plan.

## 2022-07-13 NOTE — ED Provider Notes (Signed)
[facility] Provider Note   CSN: [id number] Arrival date & time: 07/13/22  0735     History  Chief Complaint  Patient presents with   Chest Pain    Tanya Hogan is a 71 y.o. female.  She is here with a complaint of right-sided chest wall pain after a fall about 8 days ago.  She said she landed on her right side.  Was seen at outside hospital had negative x-rays.  She said she was doing pretty well until yesterday when the pain worsened.  Having difficulty breathing and coughing twisting and turning.  Taking Alka-Seltzer for pain control and has been having increasing weight gain.  She also said she has not moved her bowels in 24 hours.  No fevers or chills nausea or vomiting.  No numbness or weakness.  The history is provided by the patient.  Chest Pain Pain location:  R lateral chest Pain quality: stabbing   Pain severity:  Severe Onset quality:  Sudden Duration:  8 days Timing:  Constant Progression:  Unchanged Chronicity:  New Context: breathing, movement and trauma   Relieved by:  Nothing Worsened by:  Coughing, deep breathing and movement Ineffective treatments: Alka-Seltzer and Tylenol. Associated symptoms: shortness of breath   Associated symptoms: no abdominal pain, no cough, no diaphoresis, no fever, no nausea, no vomiting and no weakness   Associated symptoms comment:  Constipation Risk factors: coronary artery disease and diabetes mellitus        Home Medications Prior to Admission medications   Medication Sig Start Date End Date Taking? Authorizing Provider  acetaminophen (TYLENOL) 500 MG tablet Take 500-1,000 mg by mouth every 6 (six) hours as needed (for pain.).    [provider]  albuterol (PROVENTIL) (2.5 MG/3ML) 0.083% nebulizer solution Take 2.5 mg by nebulization every 6 (six) hours as needed for wheezing or shortness of breath.    [provider]  albuterol (VENTOLIN HFA) 108 (90 Base) MCG/ACT  inhaler Inhale 1-2 puffs into the lungs every 6 (six) hours as needed for wheezing or shortness of breath. 05/27/21   Hali Marry, MD  alendronate (FOSAMAX) 70 MG tablet TAKE 1 TABLET BY MOUTH  EVERY 7 DAYS WITH A FULL  GLASS OF WATER ON AN EMPTY  STOMACH 06/18/22   Hali Marry, MD  AMBULATORY NON FORMULARY MEDICATION Medication Name: Tubing and supplies for nebulizer. Fax - [phone] 08/14/19   Hali Marry, MD  amiodarone (PACERONE) 200 MG tablet Take 0.5 tablets (100 mg total) by mouth daily. 05/22/22   Swinyer, Lanice Schwab, NP  B Complex-C (B-COMPLEX WITH VITAMIN C) tablet Take 1 tablet by mouth in the morning.    [provider]  bumetanide (BUMEX) 1 MG tablet Take 1 tablet (1 mg total) by mouth 2 (two) times daily. You may take an additional tablet (1 mg) as needed for lower extremity edema 03/03/22   Elgie Collard, PA-C  carvedilol (COREG) 3.125 MG tablet Take 1 tablet (3.125 mg total) by mouth 2 (two) times daily. 03/02/22   Freada Bergeron, MD  Continuous Blood Gluc Receiver (FREESTYLE LIBRE 2 READER) DEVI by Does not apply route.    [provider]  Continuous Blood Gluc Sensor (FREESTYLE LIBRE 2 SENSOR) MISC 1 Device by Does not apply route every 14 (fourteen) days. 07/10/22   Hali Marry, MD  empagliflozin (JARDIANCE) 10 MG TABS tablet Take 1 tablet (10 mg total) by mouth daily before breakfast. 06/29/22  Freada Bergeron, MD  EPINEPHRINE 0.3 mg/0.3 mL IJ SOAJ injection INJECT 1 PEN IN THE MUSCLE ONE TIME AS DIRECTED FOR ANAPHYLAXIS. PLEASE CALL 911 AFTERWARDS 03/02/22   Hali Marry, MD  fexofenadine (ALLEGRA) 180 MG tablet Take 180 mg by mouth in the morning.    [provider]  fluticasone-salmeterol (ADVAIR HFA) 115-21 MCG/ACT inhaler Inhale 2 puffs into the lungs 2 (two) times daily.    [provider]  levothyroxine (SYNTHROID) 88 MCG tablet TAKE 1 TABLET(88 MCG) BY MOUTH DAILY 03/23/22    Hali Marry, MD  LINZESS 72 MCG capsule Take 72-144 mcg by mouth 4 (four) times daily as needed (constipation.). 02/20/21   [provider]  Multiple Vitamins-Minerals (OCUVITE EYE + MULTI) TABS Take 1 tablet by mouth in the morning.    [provider]  pantoprazole (PROTONIX) 40 MG tablet Take 1 tablet (40 mg total) by mouth daily before breakfast. 02/16/22   Hali Marry, MD  PAPAYA ENZYME PO Take 1 capsule by mouth in the morning.    [provider]  pravastatin (PRAVACHOL) 40 MG tablet TAKE 1 TABLET BY MOUTH AT  BEDTIME 06/23/22   Hali Marry, MD  Probiotic Product ([employer] PROBIOTIC) CAPS Take 2 capsules by mouth in the morning. 07/23/21   [provider]  Semaglutide,0.25 or 0.5MG/DOS, (OZEMPIC, 0.25 OR 0.5 MG/DOSE,) 2 MG/1.5ML SOPN Inject 0.5 mg into the skin every Saturday.    [provider]  SODIUM FLUORIDE 5000 PLUS 1.1 % CREA dental cream as directed. 06/10/22   [provider]  Tiotropium Bromide Monohydrate (SPIRIVA RESPIMAT) 1.25 MCG/ACT AERS Inhale 2 puffs into the lungs daily.    [provider]  valACYclovir (VALTREX) 1000 MG tablet Take 1 tablet (1,000 mg total) by mouth 2 (two) times daily. Patient taking differently: Take 1,000 mg by mouth as needed (fever blisters/cold sores.). 08/05/21   Hali Marry, MD      Allergies    Christeen Douglas [ferric carboxymaltose], Penicillins, Accupril [quinapril hcl], Aleve [naproxen], Celebrex [celecoxib], Cozaar [losartan potassium], Flonase [fluticasone], Levomenol, Lipitor [atorvastatin], Nasonex [mometasone furoate], Xyzal [levocetirizine], Chamomile, Vesicare [solifenacin], Aspirin, Baking soda-fluoride [sodium fluoride], Clindamycin/lincomycin, Codeine, Furosemide, Lactose intolerance (gi), Lanolin-petrolatum, Metformin and related, Miralax [polyethylene glycol], Quinine, Reclast [zoledronic acid], Rifampin, Sulfa antibiotics, Sulfonamide derivatives,  Tramadol, Wellbutrin [bupropion], and Wool alcohol [lanolin]    Review of Systems   Review of Systems  Constitutional:  Negative for diaphoresis and fever.  Respiratory:  Positive for shortness of breath. Negative for cough.   Cardiovascular:  Positive for chest pain.  Gastrointestinal:  Positive for constipation. Negative for abdominal pain, nausea and vomiting.  Genitourinary:  Negative for dysuria.  Neurological:  Negative for weakness.    Physical Exam Updated Vital Signs BP (!) 153/80   Pulse 77   Temp (!) 97.5 F (36.4 C)   Resp 18   SpO2 96%  Physical Exam Vitals and nursing note reviewed.  Constitutional:      General: She is not in acute distress.    Appearance: She is well-developed. She is obese.  HENT:     Head: Normocephalic and atraumatic.  Eyes:     Conjunctiva/sclera: Conjunctivae normal.  Cardiovascular:     Rate and Rhythm: Normal rate and regular rhythm.     Heart sounds: Normal heart sounds. No murmur heard. Pulmonary:     Effort: Pulmonary effort is normal. No respiratory distress.     Breath sounds: Normal breath sounds.  Chest:  Chest wall: Tenderness present.     Comments: She is tender right lower lateral chest Abdominal:     Palpations: Abdomen is soft.     Tenderness: There is no abdominal tenderness.  Musculoskeletal:        General: No swelling.     Cervical back: Neck supple.  Skin:    General: Skin is warm and dry.     Capillary Refill: Capillary refill takes less than 2 seconds.  Neurological:     General: No focal deficit present.     Mental Status: She is alert.     ED Results / Procedures / Treatments   Labs (all labs ordered are listed, but only abnormal results are displayed) Labs Reviewed  COMPREHENSIVE METABOLIC PANEL - Abnormal; Notable for the following components:      Result Value   Glucose, Bld 132 (*)    Calcium 8.6 (*)    Alkaline Phosphatase 132 (*)    All other components within normal limits  BRAIN  NATRIURETIC PEPTIDE - Abnormal; Notable for the following components:   B Natriuretic Peptide 530.7 (*)    All other components within normal limits  URINALYSIS, ROUTINE W REFLEX MICROSCOPIC - Abnormal; Notable for the following components:   Glucose, UA >=500 (*)    All other components within normal limits  URINALYSIS, MICROSCOPIC (REFLEX) - Abnormal; Notable for the following components:   Bacteria, UA RARE (*)    All other components within normal limits  CBC WITH DIFFERENTIAL/PLATELET  TROPONIN I (HIGH SENSITIVITY)    EKG EKG Interpretation  Date/Time:  Monday July 13 2022 07:48:21 EST Ventricular Rate:  79 PR Interval:  143 QRS Duration: 182 QT Interval:  462 QTC Calculation: 530 R Axis:   0 Text Interpretation: atrial sensed ventricular paced rhythm ST depr, consider ischemia, inferior leads Prolonged QT interval No significant change since prior 3/23 Confirmed by Aletta Edouard [phone]) on 07/13/2022 7:52:22 AM  Radiology DG Ribs Unilateral W/Chest Right  Result Date: 07/13/2022 CLINICAL DATA:  Chest pain.  Status post fall. EXAM: RIGHT RIBS AND CHEST - 3+ VIEW COMPARISON:  02/13/2022 FINDINGS: There is a left chest wall pacer device with leads in the coronary sinus, right ventricle and right atrial appendage. Stable cardiac enlargement. Mild interstitial edema. No displaced rib fractures identified. Status post right shoulder arthroplasty. IMPRESSION: 1. No displaced rib fractures identified. 2. Cardiac enlargement and mild interstitial edema. Electronically Signed   By: Kerby Moors M.D.   On: 07/13/2022 08:16    Procedures Procedures    Medications Ordered in ED Medications  fentaNYL (SUBLIMAZE) injection 50 mcg (has no administration in time range)    ED Course/ Medical Decision Making/ A&P Clinical Course as of 07/13/22 1728  Mon Jul 13, 2022  0824 Chest x-ray interpreted by me as pacer in place no acute infiltrates no pneumothorax.  Awaiting radiology  reading. [MB]  0940 Results of work-up fairly unremarkable.  BNP is elevated for patient and she is on diuretics.  We will have her take an extra dose for few days.  Chest x-ray does not show obvious fracture.  Will cover with some pain medication although she is counseled that this may make her more constipated than her chronic constipation and she should take a laxative.  Recommended close follow-up with PCP. [MB]    Clinical Course User Index [MB] Hayden Rasmussen, MD  Medical Decision Making Amount and/or Complexity of Data Reviewed Labs: ordered. Radiology: ordered.  Risk Prescription drug management.   This patient complains of right-sided chest pain after a fall; this involves an extensive number of treatment Options and is a complaint that carries with it a high risk of complications and morbidity. The differential includes fracture, contusion, ACS, pneumothorax, pneumonia  I ordered, reviewed and interpreted labs, which included CBC normal chemistries normal other than mildly elevated glucose, troponins flat, BNP mildly elevated, urinalysis without signs of infection I ordered medication IV pain medicine with improvement in her symptoms and reviewed PMP when indicated. I ordered imaging studies which included chest x-ray and right rib series and I independently    visualized and interpreted imaging which showed no acute traumatic findings Additional history obtained from patient's husband Previous records obtained and reviewed in epic including prior urgent care visit Cardiac monitoring reviewed, normal sinus rhythm Social determinants considered, no significant barriers Critical Interventions: None  After the interventions stated above, I reevaluated the patient and found patient to be oxygenating well on room air Admission and further testing considered, no indications for admission or further work-up at this time.  Will place patient on some pain  medication but discussed side effects and need for taking a laxative.  Recommended close follow-up with PCP.  Return instructions discussed         Final Clinical Impression(s) / ED Diagnoses Final diagnoses:  Chest wall pain  Fall, subsequent encounter    Rx / DC Orders ED Discharge Orders          Ordered    oxyCODONE-acetaminophen (PERCOCET/ROXICET) 5-325 MG tablet  Every 8 hours PRN        07/13/22 0934              Hayden Rasmussen, MD 07/13/22 1730

## 2022-07-13 NOTE — Discharge Instructions (Signed)
You were seen in the emergency department for pain in your right side after a fall.  Your chest x-ray did not show any obvious rib fractures.  Your labs showed some extra fluid retention and so please take an extra dose of your fluid pill bumetanide daily for 3 days.  We are prescribing you some pain medication.  Please use caution with this as this can make you more constipated and you should take with a stool softener and laxative.  Follow-up with your regular doctor.  Avoid excess salt intake.  Turn if any worsening or concerning symptoms

## 2022-07-13 NOTE — Telephone Encounter (Signed)
Patient is in the ED now for fall.

## 2022-07-13 NOTE — ED Triage Notes (Signed)
Patient presents to ED via POV from home. Here with chest pain x 1 week. Pain has been present since fall a week ago. Mechanical fall. Patient was seen at [facility] ED post fall, no injuries reported. Not on blood thinners. No LOC.

## 2022-07-20 ENCOUNTER — Telehealth: Payer: Self-pay | Admitting: Cardiology

## 2022-07-20 NOTE — Telephone Encounter (Signed)
Pt was calling back as Dr. Johney Frame advised at last OV, to report to [place] how her symptoms are since stopping ozempic and starting her jardiance.   Pt states since stopping ozempic, her stomach and bowel issues have improved and her sugars have as well.  She states her sugars average in the 130s as it's highest level.  She states she is tolerating jardiance appropriately and would like to continue staying off the ozempic and continuing the jardiance for another month, and report back to [place] again if her symptoms are still improving at that time or not, and report off blood sugars again.  Pt aware that I will route this message to Dr. Johney Frame to make her aware of this plan.  She is aware that I will call her back if any additional changes need to be made.   Pt verbalized understadnign and agrees with this plan.   Off note:  Pt would like for our device team to give her a call back sometime today, to discuss her pacemaker questions/concerns. She did not go into detail, but wanted me to ask for them to give her a call to assist her with basic questions.   Pt aware that I will route this message to our Device team as well, so that they can return a call back to her sometime today. Pt agreed to this plan.

## 2022-07-20 NOTE — Telephone Encounter (Signed)
Pt c/o medication issue:  1. Name of Medication:   empagliflozin (JARDIANCE) 10 MG TABS tablet    2. How are you currently taking this medication (dosage and times per day)? 1 tablet daily  3. Are you having a reaction (difficulty breathing--STAT)? no  4. What is your medication issue? Patient states she was put on jardiance and was told to call back after a month. She says it is a week shy of a month and she has noticed her blood sugar rising. She says she has been to the ED twice and her A1c can be seen in her chart. She says she is not sure if her jardiance needs to be increased before she gets a refill. She says due to the holiday's she has also been eating more rich foods. She says she has also had low blood sugar in the past. She says her A1c rising has been 150 and has been running 130. She says it is in a normal range, but it is rising.

## 2022-07-20 NOTE — Telephone Encounter (Signed)
Pt aware that Dr. Johney Frame wants her to continue remaining off of ozempic and continue with her jardiance regimen, and keep [place] posted in about a month, to report is symptom improvement is still there or not.    Pt verbalized understanding  and agrees with this plan.

## 2022-07-20 NOTE — Telephone Encounter (Signed)
Returned call to Pt.  Per Pt she is having low readings in the morning.  Asked if she meant heart rate or blood pressure.  She states it is her blood pressure.  Advised Pt that her device does not effect her blood pressure, and blood pressure concerns she should discuss with Dr. Johney Frame.  She indicates understanding.

## 2022-07-20 NOTE — Telephone Encounter (Signed)
Brita, Jurgensen - 07/20/2022  8:51 AM Freada Bergeron, MD  Sent: Mon July 20, 2022 12:45 PM  To: Nuala Alpha, LPN         Message  Sounds great. Thank you!!

## 2022-07-22 ENCOUNTER — Other Ambulatory Visit: Payer: Self-pay | Admitting: *Deleted

## 2022-07-22 MED ORDER — BUMETANIDE 1 MG PO TABS: 1.0000 mg | ORAL_TABLET | Freq: Two times a day (BID) | ORAL | 3 refills | Status: DC

## 2022-07-23 DIAGNOSIS — I5020 Unspecified systolic (congestive) heart failure: Secondary | ICD-10-CM | POA: Diagnosis not present

## 2022-07-23 DIAGNOSIS — Z7984 Long term (current) use of oral hypoglycemic drugs: Secondary | ICD-10-CM

## 2022-07-23 DIAGNOSIS — E1159 Type 2 diabetes mellitus with other circulatory complications: Secondary | ICD-10-CM

## 2022-07-28 ENCOUNTER — Encounter: Payer: Self-pay | Admitting: Family Medicine

## 2022-07-28 ENCOUNTER — Ambulatory Visit (INDEPENDENT_AMBULATORY_CARE_PROVIDER_SITE_OTHER): Payer: Medicare Other | Admitting: Pharmacist

## 2022-07-28 DIAGNOSIS — I10 Essential (primary) hypertension: Secondary | ICD-10-CM

## 2022-07-28 DIAGNOSIS — E118 Type 2 diabetes mellitus with unspecified complications: Secondary | ICD-10-CM

## 2022-07-28 DIAGNOSIS — E1169 Type 2 diabetes mellitus with other specified complication: Secondary | ICD-10-CM

## 2022-07-28 NOTE — Progress Notes (Signed)
Chronic Care Management Pharmacy Note  07/28/2022 Name:  Tanya Hogan MRN:  [id number] DOB:  September 15, 1950  Summary: addressed HTN, HLD, and primarily DM. She has discontinued ozempic from guidance of another provider due to stomach/bowel side effects.  BG 140-150s in AM. She is concerned about them running higher than her normal.  She suffered a fall in November, bruised ribs, is now recovered.  She is exercising by going to the gym.  Recommendations/Changes made from today's visit:  - No medication changes, provided counseling on BG and A1c goals. If A1c <7 at next PCP visit, this should reassure her that she is still at goal despite higher blood sugar than she is used to seeing.  Plan: f/u with pharmacist in 1 month  Subjective: Tanya Hogan is an 71 y.o. year old female who is a primary patient of [name], Rene Kocher, MD.  The CCM team was consulted for assistance with disease management and care coordination needs.    Engaged with patient by telephone for follow up visit in response to provider referral for pharmacy case management and/or care coordination services.   Consent to Services:  The patient was given information about Chronic Care Management services, agreed to services, and gave verbal consent prior to initiation of services.  Please see initial visit note for detailed documentation.   Patient Care Team: Hali Marry, MD as PCP - General (Family Medicine) Freada Bergeron, MD as PCP - Cardiology (Cardiology) Marcy Panning, MD (Hematology and Oncology) Lelon Perla, MD as Attending Physician (Cardiology) Evans Lance, MD (Cardiology) Dr. Caprice Kluver (Otolaryngology) Amada Kingfisher, MD as Consulting Physician (Hematology and Oncology) Gerre Pebbles, MD as Referring Physician (Gastroenterology) Haynes Dage, OD as Referring Physician (Optometry) Darius Bump, [facility] as Pharmacist (Pharmacist) Vanita Ingles, RN as Case Manager (General  Practice)   Objective:  Lab Results  Component Value Date   CREATININE 0.82 07/13/2022   CREATININE 0.94 04/06/2022   CREATININE 0.89 03/03/2022    Lab Results  Component Value Date   HGBA1C 5.9 (H) 07/07/2022   Last diabetic Eye exam:  Lab Results  Component Value Date/Time   HMDIABEYEEXA No Retinopathy 06/11/2021 12:00 AM    Last diabetic Foot exam: No results found for: "HMDIABFOOTEX"      Component Value Date/Time   CHOL 168 04/16/2021 0000   TRIG 120 04/16/2021 0000   HDL 73 04/16/2021 0000   CHOLHDL 2.3 04/16/2021 0000   VLDL 26 02/18/2017 0939   LDLCALC 74 04/16/2021 0000       Latest Ref Rng & Units 07/13/2022    7:54 AM 04/06/2022   11:00 AM 02/13/2022    4:01 PM  Hepatic Function  Total Protein 6.5 - 8.1 g/dL 7.1  7.1  7.5   Albumin 3.5 - 5.0 g/dL 3.7  4.7  4.0   AST 15 - 41 U/L 34  21  29   ALT 0 - 44 U/L _0 Alk Phosphatase 38 - 126 U/L 132  125  110   Total Bilirubin 0.3 - 1.2 mg/dL 0.3  0.5  0.5     Lab Results  Component Value Date/Time   TSH 2.78 07/07/2022 12:00 AM   TSH 0.93 02/16/2022 01:43 PM       Latest Ref Rng & Units 07/13/2022    7:54 AM 02/13/2022    4:01 PM 10/24/2021   10:45 AM  CBC  WBC 4.0 - 10.5 K/uL  7.6  8.0  6.1   Hemoglobin 12.0 - 15.0 g/dL 14.0  15.1  15.1   Hematocrit 36.0 - 46.0 % 43.6  45.0  46.6   Platelets 150 - 400 K/uL 237  253  243     Lab Results  Component Value Date/Time   VD25OH 65 05/04/2014 02:27 PM   VD25OH 61 12/29/2010 08:05 AM    Clinical ASCVD: Yes  The 10-year ASCVD risk score (Arnett DK, et al., 2019) is: 31.9%   Values used to calculate the score:     Age: 71 years     Sex: Female     Is Non-Hispanic African American: No     Diabetic: Yes     Tobacco smoker: No     Systolic Blood Pressure: 025 mmHg     Is BP treated: Yes     HDL Cholesterol: 73 mg/dL     Total Cholesterol: 178 mg/dL      Social History   Tobacco Use  Smoking Status Former   Packs/day: 0.00   Years:  0.00   Total pack years: 0.00   Types: Cigarettes   Quit date: 08/24/1966   Years since quitting: 55.9  Smokeless Tobacco Never   BP Readings from Last 3 Encounters:  07/13/22 134/84  07/07/22 136/62  06/29/22 124/84   Pulse Readings from Last 3 Encounters:  07/13/22 67  07/07/22 66  06/29/22 65   Wt Readings from Last 3 Encounters:  07/07/22 212 lb (96.2 kg)  06/29/22 212 lb 3.2 oz (96.3 kg)  05/21/22 208 lb 3.2 oz (94.4 kg)    Assessment: Review of patient past medical history, allergies, medications, health status, including review of consultants reports, laboratory and other test data, was performed as part of comprehensive evaluation and provision of chronic care management services.   SDOH:  (Social Determinants of Health) assessments and interventions performed:  SDOH Interventions    Flowsheet Row Chronic Care Management from 07/03/2022 in [place] Office Visit from 02/25/2021 in [place] Interventions Intervention Not Indicated Intervention Not Indicated  Housing Interventions Intervention Not Indicated Intervention Not Indicated  Transportation Interventions Intervention Not Indicated Intervention Not Indicated  Utilities Interventions Intervention Not Indicated --  Alcohol Usage Interventions Intervention Not Indicated (Score <7) --  Financial Strain Interventions Intervention Not Indicated Intervention Not Indicated  Physical Activity Interventions Intervention Not Indicated  [goes to the [facility] and takes classes almost daily] Intervention Not Indicated  Stress Interventions Intervention Not Indicated Intervention Not Indicated  Social Connections Interventions Intervention Not Indicated, Other (Comment)  [has good support system and church friends] Intervention Not Indicated       CCM Care Plan  Allergies  Allergen Reactions   Injectafer [Ferric  Carboxymaltose] Shortness Of Breath    Mild SOB following injectafer infusion Mild SOB following injectafer infusion    Penicillins Anaphylaxis and Rash    Has patient had a PCN reaction causing immediate rash, facial/tongue/throat swelling, SOB or lightheadedness with hypotension: Yes Has patient had a PCN reaction causing severe rash involving mucus membranes or skin necrosis: No Has patient had a PCN reaction that required hospitalization: No Has patient had a PCN reaction occurring within the last 10 years: No If all of the above answers are "NO", then may proceed with Cephalosporin use. "Stuttered with medication"     Accupril [Quinapril Hcl] Other (See Comments)    Hyperkalemia=high potassium level  Aleve [Naproxen] Swelling   Celebrex [Celecoxib]     ulcers   Cozaar [Losartan Potassium] Itching   Flonase [Fluticasone] Other (See Comments)    Nasal ulcer.     Levomenol Other (See Comments)    unknown unknown    Lipitor [Atorvastatin] Other (See Comments)    muscle aches/pains   Nasonex [Mometasone Furoate] Other (See Comments)    nosebleed   Xyzal [Levocetirizine] Other (See Comments)    Doesn't recall reaction type   Chamomile Other (See Comments)    unknown   Vesicare [Solifenacin] Other (See Comments)    "stopped me from urinating"   Aspirin Other (See Comments)    Pt states "burns holes in stomach", ulcers    Baking Soda-Fluoride [Sodium Fluoride] Itching and Rash   Clindamycin/Lincomycin Rash   Codeine Nausea And Vomiting   Furosemide Other (See Comments)    Nose bleed   Lactose Intolerance (Gi) Other (See Comments)    Increased calcium, sneezing, nasal issues   Lanolin-Petrolatum Rash   Metformin And Related Diarrhea   Miralax [Polyethylene Glycol] Rash   Quinine Rash   Reclast [Zoledronic Acid] Rash and Other (See Comments)    shaking   Rifampin Other (See Comments)    DOES NOT REMEMBER THIS MEDICATION OR ALLERGY   Sulfa Antibiotics Rash    Sulfonamide Derivatives Rash   Tramadol Rash    Anxiety   Wellbutrin [Bupropion] Other (See Comments)    Muscle aches, pain, "made my skin crawl"   Wool Alcohol [Lanolin] Rash    Medications Reviewed Today     Reviewed by Hali Marry, MD (Physician) on 07/07/22 at 1049  Med List Status: <None>   Medication Order Taking? Sig Documenting Provider Last Dose Status Informant  acetaminophen (TYLENOL) 500 MG tablet [id number] Yes Take 500-1,000 mg by mouth every 6 (six) hours as needed (for pain.). [provider] Taking Active Self  albuterol (PROVENTIL) (2.5 MG/3ML) 0.083% nebulizer solution [id number] Yes Take 2.5 mg by nebulization every 6 (six) hours as needed for wheezing or shortness of breath. [provider] Taking Active Self  albuterol (VENTOLIN HFA) 108 (90 Base) MCG/ACT inhaler [id number] Yes Inhale 1-2 puffs into the lungs every 6 (six) hours as needed for wheezing or shortness of breath. Hali Marry, MD Taking Active Self  alendronate (FOSAMAX) 70 MG tablet [id number] Yes TAKE 1 TABLET BY MOUTH  EVERY 7 DAYS WITH A FULL  GLASS OF WATER ON AN EMPTY  STOMACH Hali Marry, MD Taking Active   AMBULATORY NON [place] [id number] Yes Medication Name: Tubing and supplies for nebulizer. Fax - [phone] Hali Marry, MD Taking Active Self  amiodarone (PACERONE) 200 MG tablet [id number] Yes Take 0.5 tablets (100 mg total) by mouth daily. Swinyer, Lanice Schwab, NP Taking Active   B Complex-C (B-COMPLEX WITH VITAMIN C) tablet [id number] Yes Take 1 tablet by mouth in the morning. [provider] Taking Active Self  bumetanide (BUMEX) 1 MG tablet [id number] Yes Take 1 tablet (1 mg total) by mouth 2 (two) times daily. You may take an additional tablet (1 mg) as needed for lower extremity edema Harriet Pho, Tessa N, PA-C Taking Active   carvedilol (COREG) 3.125 MG tablet [id number] Yes Take 1 tablet (3.125 mg total) by mouth 2 (two) times  daily. Freada Bergeron, MD Taking Active   Continuous Blood Gluc Receiver (FREESTYLE LIBRE 2 READER) DEVI [id number] Yes by Does not apply route. [provider] Taking Active Self  Continuous Blood Gluc Sensor (  [place]) MISC [id number] Yes by Does not apply route. [provider] Taking Active Self  empagliflozin (JARDIANCE) 10 MG TABS tablet [id number] Yes Take 1 tablet (10 mg total) by mouth daily before breakfast. Freada Bergeron, MD Taking Active   EPINEPHRINE 0.3 mg/0.3 mL IJ SOAJ injection [id number] Yes INJECT 1 PEN IN THE MUSCLE ONE TIME AS DIRECTED FOR ANAPHYLAXIS. PLEASE CALL 911 AFTERWARDS Hali Marry, MD Taking Active   fexofenadine [facility]) 180 MG tablet [id number] Yes Take 180 mg by mouth in the morning. [provider] Taking Active Self  fluticasone-salmeterol (ADVAIR HFA) 800-34 MCG/ACT inhaler [id number] Yes Inhale 2 puffs into the lungs 2 (two) times daily. [provider] Taking Active Self  levothyroxine (SYNTHROID) 88 MCG tablet [id number] Yes TAKE 1 TABLET(88 MCG) BY MOUTH DAILY Hali Marry, MD Taking Active   LINZESS 72 MCG capsule [id number] Yes Take 72-144 mcg by mouth 4 (four) times daily as needed (constipation.). [provider] Taking Active Self           Med Note Louretta Shorten, APRIL   Mon Oct 13, 2021 11:02 AM)    Multiple Vitamins-Minerals (OCUVITE EYE + MULTI) TABS [id number] Yes Take 1 tablet by mouth in the morning. [provider] Taking Active Self  pantoprazole (PROTONIX) 40 MG tablet [id number] Yes Take 1 tablet (40 mg total) by mouth daily before breakfast. Hali Marry, MD Taking Active   PAPAYA ENZYME PO [id number] Yes Take 1 capsule by mouth in the morning. [provider] Taking Active Self  pravastatin (PRAVACHOL) 40 MG tablet [id number] Yes TAKE 1 TABLET BY MOUTH AT  BEDTIME Hali Marry, MD Taking Active   Probiotic Product ([employer] PROBIOTIC)  CAPS [id number] Yes Take 2 capsules by mouth in the morning. [provider] Taking Active Self  Semaglutide,0.25 or 0.5MG/DOS, (OZEMPIC, 0.25 OR 0.5 MG/DOSE,) 2 MG/1.5ML SOPN [id number] Yes Inject 0.5 mg into the skin every Saturday. [provider] Taking Active Self           Med Note Louretta Shorten, APRIL   Mon Oct 13, 2021 11:02 AM)    SODIUM FLUORIDE 5000 PLUS 1.1 % CREA dental cream [id number] Yes as directed. [provider] Taking Active   Tiotropium Bromide Monohydrate (SPIRIVA RESPIMAT) 1.25 MCG/ACT AERS [id number] Yes Inhale 2 puffs into the lungs daily. [provider] Taking Active Self  valACYclovir (VALTREX) 1000 MG tablet [id number] Yes Take 1 tablet (1,000 mg total) by mouth 2 (two) times daily.  Patient taking differently: Take 1,000 mg by mouth as needed (fever blisters/cold sores.).   Hali Marry, MD Taking Active Self            Patient Active Problem List   Diagnosis Date Noted   Stress and adjustment reaction 04/08/2022   Gastroesophageal reflux disease 02/16/2022   Complete heart block ([place]) 10/24/2021   Osteoporosis 02/28/2021   Primary osteoarthritis of left knee 01/14/2021   Irritable bowel syndrome with constipation 09/16/2020   Aortic atherosclerosis ([place]) 06/11/2020   Benign lipomatous neoplasm of skin and subcutaneous tissue of left leg 06/11/2020   Paroxysmal VT ([place]) 02/14/2020   Chronic constipation 10/06/2019   Moderate persistent asthma with exacerbation 08/29/2019   Genetic testing 08/14/2019   Ductal carcinoma in situ (DCIS) of right breast 06/15/2019   Arthralgia 04/11/2019   Malignant tumor of breast ([place]) 03/06/2019   Fatigue 03/06/2019   Status post total shoulder arthroplasty, right 03/10/2018   DDD (degenerative disc disease), cervical 11/19/2017  Peripheral edema 05/19/2017   Microcytic anemia 05/14/2017   Delayed sleep phase syndrome 10/29/2016   Primary osteoarthritis of left ankle 09/24/2016    Gastrointestinal food sensitivity 05/21/2016   History of arthroplasty of right shoulder 02/24/2016   Bilateral foot pain 01/22/2016   NASH (nonalcoholic steatohepatitis) 05/26/2015    Class: Stage 3   Nasal septal perforation 07/11/2014   Controlled diabetes mellitus type 2 with complications ([place]) 28/06/8866   Physical deconditioning 04/24/2014   Obesity, Class II, BMI 35-39.9, with comorbidity 06/09/2013   OSA (obstructive sleep apnea) 73/73/6681   Chronic systolic heart failure ([place]) 05/23/2013   Asthma, moderate persistent 04/18/2013   Chronic cough 04/18/2013   Breast cancer of upper-inner quadrant of left female breast ([place]) 03/18/2010   Hyperlipidemia 11/07/2009   Fibromyalgia 10/29/2009   Biventricular cardiac pacemaker in situ 10/23/2009   ALKALINE PHOSPHATASE, ELEVATED 10/13/2009   Hypothyroidism 10/03/2009   Benign essential hypertension 07/02/2009   Chronic back pain 07/02/2009    Immunization History  Administered Date(s) Administered   Fluad Quad(high Dose 65+) 04/11/2019, 05/02/2020, 04/18/2021, 04/24/2021   Influenza Split 05/10/2012, 05/04/2014   Influenza Whole 06/14/2009, 05/08/2010, 04/24/2011   Influenza, High Dose Seasonal PF 05/17/2017, 05/24/2018, 04/11/2019, 05/02/2020   Influenza,inj,Quad PF,6+ Mos 04/28/2013, 05/04/2014, 05/06/2015, 05/18/2016   Moderna SARS-COV2 Booster Vaccination 10/29/2021   Moderna Sars-Covid-2 Vaccination 11/01/2019, 11/29/2019, 07/19/2020   Pneumococcal Conjugate-13 05/25/2014   Pneumococcal Polysaccharide-23 11/28/2012, 12/28/2018   Td 08/24/2004, 03/07/2010   Tdap 07/14/2012   Zoster Recombinat (Shingrix) 10/06/2017, 01/16/2020   Zoster, Live 04/24/2011    Conditions to be addressed/monitored: HTN, HLD, and DMII  There are no care plans that you recently modified to display for this patient.        Medication Assistance:  Pending  Patient's preferred pharmacy is:  [id number] - [place],  [place] - [address] AT [place] [address] [place] [address] Phone: [phone] Fax: [phone]  [facility] ([place], [place] [facility] [address] [place] [address] Phone: [phone] Fax: [facility] [phone] - [place], [place] [address] [place] [address] Phone: [phone] Fax: [phone]  [place], [place] [address] Phone: [phone] Fax: [phone]   Uses pill box? Yes Pt endorses 100% compliance  Follow Up:  Patient agrees to Care Plan and Follow-up.  Plan: Telephone follow up appointment with care management team member scheduled for:  1 month  Larinda Buttery, PharmD Clinical Pharmacist [facility] [phone]

## 2022-07-29 NOTE — Patient Instructions (Signed)
Visit Information  Thank you for taking time to visit with me today. Please don't hesitate to contact me if I can be of assistance to you before our next scheduled telephone appointment.   Please call the care guide team at [phone] if you need to cancel or reschedule your appointment.    Patient verbalizes understanding of instructions and care plan provided today and agrees to view in [place]. Active MyChart status and patient understanding of how to access instructions and care plan via MyChart confirmed with patient.     Larinda Buttery, PharmD Clinical Pharmacist [facility] [phone]

## 2022-08-04 ENCOUNTER — Ambulatory Visit (INDEPENDENT_AMBULATORY_CARE_PROVIDER_SITE_OTHER): Payer: Medicare Other

## 2022-08-04 DIAGNOSIS — I442 Atrioventricular block, complete: Secondary | ICD-10-CM

## 2022-08-04 LAB — CUP PACEART REMOTE DEVICE CHECK
Battery Remaining Longevity: 92 mo
Battery Voltage: 3.04 V
Brady Statistic AP VP Percent: 40.01 %
Brady Statistic AP VS Percent: 0.1 %
Brady Statistic AS VP Percent: 58.12 %
Brady Statistic AS VS Percent: 1.78 %
Brady Statistic RA Percent Paced: 41.02 %
Brady Statistic RV Percent Paced: 98.13 %
Date Time Interrogation Session: 20231212021800
Implantable Lead Connection Status: 753985
Implantable Lead Connection Status: 753985
Implantable Lead Connection Status: 753985
Implantable Lead Implant Date: 20030324
Implantable Lead Implant Date: 20030324
Implantable Lead Implant Date: 20230313
Implantable Lead Location: 753858
Implantable Lead Location: 753859
Implantable Lead Location: 753860
Implantable Lead Model: 3830
Implantable Lead Model: 4068
Implantable Lead Model: 4193
Implantable Pulse Generator Implant Date: 20230313
Lead Channel Impedance Value: 3306 Ohm
Lead Channel Impedance Value: 3306 Ohm
Lead Channel Impedance Value: 3306 Ohm
Lead Channel Impedance Value: 361 Ohm
Lead Channel Impedance Value: 361 Ohm
Lead Channel Impedance Value: 513 Ohm
Lead Channel Impedance Value: 532 Ohm
Lead Channel Impedance Value: 760 Ohm
Lead Channel Impedance Value: 912 Ohm
Lead Channel Pacing Threshold Amplitude: 0.625 V
Lead Channel Pacing Threshold Amplitude: 1.75 V
Lead Channel Pacing Threshold Pulse Width: 0.4 ms
Lead Channel Pacing Threshold Pulse Width: 0.8 ms
Lead Channel Sensing Intrinsic Amplitude: 15.125 mV
Lead Channel Sensing Intrinsic Amplitude: 15.125 mV
Lead Channel Sensing Intrinsic Amplitude: 2.5 mV
Lead Channel Sensing Intrinsic Amplitude: 2.5 mV
Lead Channel Setting Pacing Amplitude: 2 V
Lead Channel Setting Pacing Amplitude: 2.5 V
Lead Channel Setting Pacing Amplitude: 2.5 V
Lead Channel Setting Pacing Pulse Width: 0.4 ms
Lead Channel Setting Pacing Pulse Width: 0.8 ms
Lead Channel Setting Sensing Sensitivity: 2.8 mV
Zone Setting Status: 755011
Zone Setting Status: 755011

## 2022-08-05 DIAGNOSIS — M79672 Pain in left foot: Secondary | ICD-10-CM | POA: Diagnosis not present

## 2022-08-05 DIAGNOSIS — E119 Type 2 diabetes mellitus without complications: Secondary | ICD-10-CM | POA: Diagnosis not present

## 2022-08-05 DIAGNOSIS — M79671 Pain in right foot: Secondary | ICD-10-CM | POA: Diagnosis not present

## 2022-08-05 DIAGNOSIS — L602 Onychogryphosis: Secondary | ICD-10-CM | POA: Diagnosis not present

## 2022-08-06 ENCOUNTER — Ambulatory Visit (INDEPENDENT_AMBULATORY_CARE_PROVIDER_SITE_OTHER): Payer: Medicare Other | Admitting: Sports Medicine

## 2022-08-06 DIAGNOSIS — Z0000 Encounter for general adult medical examination without abnormal findings: Secondary | ICD-10-CM

## 2022-08-06 NOTE — Patient Instructions (Signed)
[facility] Maintenance Summary and Written Plan of Care  Ms. Hogan ,  Thank you for allowing me to perform your Medicare Annual Wellness Visit and for your ongoing commitment to your health.   Health Maintenance & Immunization History Health Maintenance  Topic Date Due   DTaP/Tdap/Td (4 - Td or Tdap) 07/14/2022   COVID-19 Vaccine (5 - 2023-24 season) 08/22/2022 (Originally 07/25/2022)   Diabetic kidney evaluation - Urine ACR  09/23/2022   HEMOGLOBIN A1C  01/05/2023   FOOT EXAM  04/09/2023   OPHTHALMOLOGY EXAM  05/27/2023   Diabetic kidney evaluation - eGFR measurement  07/14/2023   Medicare Annual Wellness (AWV)  08/07/2023   MAMMOGRAM  01/07/2024   COLONOSCOPY (Pts 45-16yr Insurance coverage will need to be confirmed)  04/06/2024   Pneumonia Vaccine 71 Years old  Completed   INFLUENZA VACCINE  Completed   DEXA SCAN  Completed   Hepatitis C Screening  Completed   Zoster Vaccines- Shingrix  Completed   HPV VACCINES  Aged Out   Immunization History  Administered Date(s) Administered   Fluad Quad(high Dose 65+) 04/11/2019, 05/02/2020, 04/18/2021, 04/24/2021   Influenza Split 05/10/2012, 05/04/2014   Influenza Whole 06/14/2009, 05/08/2010, 04/24/2011   Influenza, High Dose Seasonal PF 05/17/2017, 05/24/2018, 04/11/2019, 05/02/2020, 05/30/2022   Influenza,inj,Quad PF,6+ Mos 04/28/2013, 05/04/2014, 05/06/2015, 05/18/2016   Moderna Covid-19 Vaccine Bivalent Booster 11yr& up 05/30/2022   Moderna SARS-COV2 Booster Vaccination 10/29/2021   Moderna Sars-Covid-2 Vaccination 11/01/2019, 11/29/2019, 07/19/2020   Pneumococcal Conjugate-13 05/25/2014   Pneumococcal Polysaccharide-23 11/28/2012, 12/28/2018   Td 08/24/2004, 03/07/2010   Tdap 07/14/2012   Zoster Recombinat (Shingrix) 10/06/2017, 01/16/2020   Zoster, Live 04/24/2011    These are the patient goals that we discussed:  Goals Addressed               This Visit's Progress     Patient  Stated (pt-stated)        Patient would like to get her bowels regular.          This is a list of Health Maintenance Items that are overdue or due now: Health Maintenance Due  Topic Date Due   DTaP/Tdap/Td (4 - Td or Tdap) 07/14/2022     Orders/Referrals Placed Today: No orders of the defined types were placed in this encounter.  (Contact our referral department at [phone] you have not spoken with someone about your referral appointment within the next 5 days)    Follow-up Plan Follow-up with MeHali MarryMD as planned Schedule tetanus vaccine at the pharmacy. Medicare wellness visit in one year.  AVS printed and mailed to the patient.      Health Maintenance, Female Adopting a healthy lifestyle and getting preventive care are important in promoting health and wellness. Ask your health care provider about: The right schedule for you to have regular tests and exams. Things you can do on your own to prevent diseases and keep yourself healthy. What should I know about diet, weight, and exercise? Eat a healthy diet  Eat a diet that includes plenty of vegetables, fruits, low-fat dairy products, and lean protein. Do not eat a lot of foods that are high in solid fats, added sugars, or sodium. Maintain a healthy weight Body mass index (BMI) is used to identify weight problems. It estimates body fat based on height and weight. Your health care provider can help determine your BMI and help you achieve or maintain a healthy weight. Get regular exercise Get regular  exercise. This is one of the most important things you can do for your health. Most adults should: Exercise for at least 150 minutes each week. The exercise should increase your heart rate and make you sweat (moderate-intensity exercise). Do strengthening exercises at least twice a week. This is in addition to the moderate-intensity exercise. Spend less time sitting. Even light physical activity can be  beneficial. Watch cholesterol and blood lipids Have your blood tested for lipids and cholesterol at 73 years of age, then have this test every 5 years. Have your cholesterol levels checked more often if: Your lipid or cholesterol levels are high. You are older than 73 years of age. You are at high risk for heart disease. What should I know about cancer screening? Depending on your health history and family history, you may need to have cancer screening at various ages. This may include screening for: Breast cancer. Cervical cancer. Colorectal cancer. Skin cancer. Lung cancer. What should I know about heart disease, diabetes, and high blood pressure? Blood pressure and heart disease High blood pressure causes heart disease and increases the risk of stroke. This is more likely to develop in people who have high blood pressure readings or are overweight. Have your blood pressure checked: Every 3-5 years if you are 73-28 years of age. Every year if you are 73 years old or older. Diabetes Have regular diabetes screenings. This checks your fasting blood sugar level. Have the screening done: Once every three years after age 73 if you are at a normal weight and have a low risk for diabetes. More often and at a younger age if you are overweight or have a high risk for diabetes. What should I know about preventing infection? Hepatitis B If you have a higher risk for hepatitis B, you should be screened for this virus. Talk with your health care provider to find out if you are at risk for hepatitis B infection. Hepatitis C Testing is recommended for: Everyone born from [birth year] through 1967. Anyone with known risk factors for hepatitis C. Sexually transmitted infections (STIs) Get screened for STIs, including gonorrhea and chlamydia, if: You are sexually active and are younger than 73 years of age. You are older than 73 years of age and your health care provider tells you that you are at risk for  this type of infection. Your sexual activity has changed since you were last screened, and you are at increased risk for chlamydia or gonorrhea. Ask your health care provider if you are at risk. Ask your health care provider about whether you are at high risk for HIV. Your health care provider may recommend a prescription medicine to help prevent HIV infection. If you choose to take medicine to prevent HIV, you should first get tested for HIV. You should then be tested every 3 months for as long as you are taking the medicine. Pregnancy If you are about to stop having your period (premenopausal) and you may become pregnant, seek counseling before you get pregnant. Take 400 to 800 micrograms (mcg) of folic acid every day if you become pregnant. Ask for birth control (contraception) if you want to prevent pregnancy. Osteoporosis and menopause Osteoporosis is a disease in which the bones lose minerals and strength with aging. This can result in bone fractures. If you are 25 years old or older, or if you are at risk for osteoporosis and fractures, ask your health care provider if you should: Be screened for bone loss. Take a calcium or  vitamin D supplement to lower your risk of fractures. Be given hormone replacement therapy (HRT) to treat symptoms of menopause. Follow these instructions at home: Alcohol use Do not drink alcohol if: Your health care provider tells you not to drink. You are pregnant, may be pregnant, or are planning to become pregnant. If you drink alcohol: Limit how much you have to: 0-1 drink a day. Know how much alcohol is in your drink. In the [place] one drink equals one 12 oz bottle of beer (355 mL), one 5 oz glass of wine (148 mL), or one 1 oz glass of hard liquor (44 mL). Lifestyle Do not use any products that contain nicotine or tobacco. These products include cigarettes, chewing tobacco, and vaping devices, such as e-cigarettes. If you need help quitting, ask your health  care provider. Do not use street drugs. Do not share needles. Ask your health care provider for help if you need support or information about quitting drugs. General instructions Schedule regular health, dental, and eye exams. Stay current with your vaccines. Tell your health care provider if: You often feel depressed. You have ever been abused or do not feel safe at home. Summary Adopting a healthy lifestyle and getting preventive care are important in promoting health and wellness. Follow your health care provider's instructions about healthy diet, exercising, and getting tested or screened for diseases. Follow your health care provider's instructions on monitoring your cholesterol and blood pressure. This information is not intended to replace advice given to you by your health care provider. Make sure you discuss any questions you have with your health care provider. Document Revised: 12/30/2020 Document Reviewed: 12/30/2020 Elsevier Patient Education  [place].

## 2022-08-06 NOTE — Progress Notes (Signed)
MEDICARE ANNUAL WELLNESS VISIT  08/06/2022  Telephone Visit Disclaimer This Medicare AWV was conducted by telephone due to national recommendations for restrictions regarding the COVID-19 Pandemic (e.g. social distancing).  I verified, using two identifiers, that I am speaking with Tanya Hogan or their authorized healthcare agent. I discussed the limitations, risks, security, and privacy concerns of performing an evaluation and management service by telephone and the potential availability of an in-person appointment in the future. The patient expressed understanding and agreed to proceed.  Location of Patient: Home Location of Provider (nurse):  In the office.  Subjective:    Tanya Hogan is a 71 y.o. female patient of [name], Rene Kocher, MD who had a Medicare Annual Wellness Visit today via telephone. Tanya Hogan is Retired and Disabled and lives with her brother. she has 2 children. she reports that she is socially active and does interact with friends/family regularly. she is moderately physically active and enjoys Systems developer, Social worker, music and loves animals.  Patient Care Team: Hali Marry, MD as PCP - General (Family Medicine) Freada Bergeron, MD as PCP - Cardiology (Cardiology) Marcy Panning, MD (Hematology and Oncology) Stanford Breed Denice Bors, MD as Attending Physician (Cardiology) Evans Lance, MD (Cardiology) Dr. Caprice Kluver (Otolaryngology) Amada Kingfisher, MD as Consulting Physician (Hematology and Oncology) Gerre Pebbles, MD as Referring Physician (Gastroenterology) Haynes Dage, OD as Referring Physician (Optometry) Darius Bump, [facility] as Pharmacist (Pharmacist) Vanita Ingles, RN as Case Manager (General Practice)     08/06/2022    1:09 PM 11/03/2021    6:11 AM 10/16/2021    4:01 PM 02/25/2021    8:26 AM 10/10/2018   11:23 AM 06/13/2018   10:34 AM 03/10/2018    1:49 PM  Advanced Directives  Does Patient Have a Medical Advance  Directive? No No Yes No No No No  Type of Comptroller;Living will      Does patient want to make changes to medical advance directive?   No - Patient declined      Copy of [place] in Chart?   No - copy requested      Would patient like information on creating a medical advance directive? No - Patient declined No - Patient declined  No - Patient declined No - Patient declined No - Patient declined No - Patient declined    Hospital Utilization Over the Past 12 Months: # of hospitalizations or ER visits: 6 # of surgeries: 1  Review of Systems    Patient reports that her overall health is better compared to last year.  History obtained from chart review and the patient  Patient Reported Readings (BP, Pulse, CBG, Weight, etc) none  Pain Assessment Pain : No/denies pain     Current Medications & Allergies (verified) Allergies as of 08/06/2022       Reactions   Injectafer [ferric Carboxymaltose] Shortness Of Breath   Mild SOB following injectafer infusion Mild SOB following injectafer infusion   Penicillins Anaphylaxis, Rash   Has patient had a PCN reaction causing immediate rash, facial/tongue/throat swelling, SOB or lightheadedness with hypotension: Yes Has patient had a PCN reaction causing severe rash involving mucus membranes or skin necrosis: No Has patient had a PCN reaction that required hospitalization: No Has patient had a PCN reaction occurring within the last 10 years: No If all of the above answers are "NO", then may proceed with Cephalosporin use. "Stuttered with medication"  Accupril [quinapril Hcl] Other (See Comments)   Hyperkalemia=high potassium level   Aleve [naproxen] Swelling   Celebrex [celecoxib]    ulcers   Cozaar [losartan Potassium] Itching   Flonase [fluticasone] Other (See Comments)   Nasal ulcer.     Levomenol Other (See Comments)   unknown unknown   Lipitor [atorvastatin] Other (See  Comments)   muscle aches/pains   Nasonex [mometasone Furoate] Other (See Comments)   nosebleed   Xyzal [levocetirizine] Other (See Comments)   Doesn't recall reaction type   Chamomile Other (See Comments)   unknown   Vesicare [solifenacin] Other (See Comments)   "stopped me from urinating"   Aspirin Other (See Comments)   Pt states "burns holes in stomach", ulcers    Baking Soda-fluoride [sodium Fluoride] Itching, Rash   Clindamycin/lincomycin Rash   Codeine Nausea And Vomiting   Furosemide Other (See Comments)   Nose bleed   Lactose Intolerance (gi) Other (See Comments)   Increased calcium, sneezing, nasal issues   Lanolin-petrolatum Rash   Metformin And Related Diarrhea   Miralax [polyethylene Glycol] Rash   Quinine Rash   Reclast [zoledronic Acid] Rash, Other (See Comments)   shaking   Rifampin Other (See Comments)   DOES NOT REMEMBER THIS MEDICATION OR ALLERGY   Sulfa Antibiotics Rash   Sulfonamide Derivatives Rash   Tramadol Rash   Anxiety   Wellbutrin [bupropion] Other (See Comments)   Muscle aches, pain, "made my skin crawl"   Wool Alcohol [lanolin] Rash        Medication List        Accurate as of August 06, 2022  1:31 PM. If you have any questions, ask your nurse or doctor.          acetaminophen 500 MG tablet Commonly known as: TYLENOL Take 500-1,000 mg by mouth every 6 (six) hours as needed (for pain.).   albuterol (2.5 MG/3ML) 0.083% nebulizer solution Commonly known as: PROVENTIL Take 2.5 mg by nebulization every 6 (six) hours as needed for wheezing or shortness of breath.   albuterol 108 (90 Base) MCG/ACT inhaler Commonly known as: VENTOLIN HFA Inhale 1-2 puffs into the lungs every 6 (six) hours as needed for wheezing or shortness of breath.   alendronate 70 MG tablet Commonly known as: FOSAMAX TAKE 1 TABLET BY MOUTH  EVERY 7 DAYS WITH A FULL  GLASS OF WATER ON AN EMPTY  STOMACH   AMBULATORY NON FORMULARY MEDICATION Medication Name:  Tubing and supplies for nebulizer. Fax - [phone]   amiodarone 200 MG tablet Commonly known as: PACERONE Take 0.5 tablets (100 mg total) by mouth daily.   B-complex with vitamin C tablet Take 1 tablet by mouth in the morning.   bumetanide 1 MG tablet Commonly known as: BUMEX Take 1 tablet (1 mg total) by mouth 2 (two) times daily. You may take an additional tablet (1 mg) as needed for lower extremity edema   carvedilol 3.125 MG tablet Commonly known as: COREG Take 1 tablet (3.125 mg total) by mouth 2 (two) times daily.   [employer] Probiotic Caps Take 2 capsules by mouth in the morning.   empagliflozin 10 MG Tabs tablet Commonly known as: Jardiance Take 1 tablet (10 mg total) by mouth daily before breakfast.   EPINEPHrine 0.3 mg/0.3 mL Soaj injection Commonly known as: EPI-PEN INJECT 1 PEN IN THE MUSCLE ONE TIME AS DIRECTED FOR ANAPHYLAXIS. PLEASE CALL 911 AFTERWARDS   fexofenadine 180 MG tablet Commonly known as: ALLEGRA Take 180 mg by mouth in  the morning.   Fish Oil 1000 MG Caps Take by mouth.   fluticasone-salmeterol 115-21 MCG/ACT inhaler Commonly known as: ADVAIR HFA Inhale 2 puffs into the lungs 2 (two) times daily.   FreeStyle Libre 2 Reader [place] by Does not apply route.   FreeStyle Libre 2 Sensor Misc 1 Device by Does not apply route every 14 (fourteen) days.   levothyroxine 88 MCG tablet Commonly known as: SYNTHROID TAKE 1 TABLET(88 MCG) BY MOUTH DAILY   Linzess 72 MCG capsule Generic drug: linaclotide Take 72-144 mcg by mouth 4 (four) times daily as needed (constipation.).   MAGNESIUM PO Take 1 tablet by mouth daily.   Ocuvite Eye + Multi Tabs Take 1 tablet by mouth in the morning.   oxyCODONE-acetaminophen 5-325 MG tablet Commonly known as: PERCOCET/ROXICET Take 1 tablet by mouth every 8 (eight) hours as needed for severe pain.   Ozempic (0.25 or 0.5 MG/DOSE) 2 MG/1.5ML Sopn Generic drug: Semaglutide(0.25 or 0.5MG/DOS) Inject 0.5 mg into the  skin every Saturday.   pantoprazole 40 MG tablet Commonly known as: PROTONIX Take 1 tablet (40 mg total) by mouth daily before breakfast.   PAPAYA ENZYME PO Take 1 capsule by mouth in the morning.   pravastatin 40 MG tablet Commonly known as: PRAVACHOL TAKE 1 TABLET BY MOUTH AT  BEDTIME   Sodium Fluoride 5000 Plus 1.1 % Crea dental cream Generic drug: sodium fluoride as directed.   Spiriva Respimat 1.25 MCG/ACT Aers Generic drug: Tiotropium Bromide Monohydrate Inhale 2 puffs into the lungs daily.   valACYclovir 1000 MG tablet Commonly known as: VALTREX Take 1 tablet (1,000 mg total) by mouth 2 (two) times daily. What changed:  when to take this reasons to take this        History (reviewed): Past Medical History:  Diagnosis Date   Alkaline phosphatase elevation    Asthma    Breast cancer ([place]) 2011   Carpal tunnel syndrome    Carpal tunnel syndrome    bilateral   CHF (congestive heart failure) (HCC)    Cirrhosis, non-alcoholic (HCC)    Closed fracture of unspecified part of radius (alone)    DDD (degenerative disc disease), lumbar    Depression    pt denies   Diabetes mellitus without complication ([place])    Enlarged heart    Fibromyalgia    GERD (gastroesophageal reflux disease)    History of kidney stones    HTN (hypertension)    Hypothyroidism    IBS (irritable bowel syndrome)    LBBB (left bundle branch block)    Microcytic anemia 05/14/2017   Pneumonia    Presence of permanent cardiac pacemaker    PVD (peripheral vascular disease) ([place])    2019   Sleep apnea    uses cpap   Past Surgical History:  Procedure Laterality Date   BIV PACEMAKER GENERATOR CHANGEOUT N/A 11/03/2021   Procedure: BIV PACEMAKER GENERATOR CHANGEOUT;  Surgeon: Evans Lance, MD;  Location: [facility];  Service: Cardiovascular;  Laterality: N/A;   BREAST LUMPECTOMY Left 2011   COLONOSCOPY     FOOT SURGERY     KNEE SURGERY     LEAD REVISION/REPAIR N/A 11/03/2021    Procedure: LEAD REVISION/REPAIR;  Surgeon: Evans Lance, MD;  Location: [facility];  Service: Cardiovascular;  Laterality: N/A;   MASTECTOMY Right    05/2019   PACEMAKER GENERATOR CHANGE N/A 08/23/2014   Procedure: PACEMAKER GENERATOR CHANGE;  Surgeon: Evans Lance, MD;  Location: [facility]  [facility];  Service: Cardiovascular;  Laterality: N/A;   PACEMAKER PLACEMENT  2009   [facility]   RECTAL SURGERY  1976   REDUCTION MAMMAPLASTY Left    05/2019   TOTAL SHOULDER ARTHROPLASTY Right 03/10/2018   TOTAL SHOULDER ARTHROPLASTY Right 03/10/2018   Procedure: RIGHT TOTAL SHOULDER ARTHROPLASTY;  Surgeon: [name], MD;  Location: [facility];  Service: Orthopedics;  Laterality: Right;   TUBAL LIGATION     Family History  Problem Relation Age of Onset   Heart disease Mother    Melanoma Mother    Parkinson's disease Mother    Heart disease Father    Heart disease Brother    Heart failure Other    Breast cancer Other    Hypertension Other    Social History   Socioeconomic History   Marital status: Divorced    Spouse name: Not on file   Number of children: 2   Years of education: 14   Highest education level: Associate degree: academic program  Occupational History   Occupation: quality insurcance in [employer]    Comment: retired  Tobacco Use   Smoking status: Former    Packs/day: 0.00    Years: 0.00    Total pack years: 0.00    Types: Cigarettes    Quit date: 08/24/1966    Years since quitting: 55.9   Smokeless tobacco: Never  Vaping Use   Vaping Use: Never used  Substance and Sexual Activity   Alcohol use: No   Drug use: No   Sexual activity: Not Currently  Other Topics Concern   Not on file  Social History Narrative   Retired. Lives with and helps her brother. Likes Systems developer, Social worker and music. Loves animals.   Social Determinants of Health   Financial Resource Strain: Low Risk  (08/06/2022)   Overall Financial Resource Strain (CARDIA)     Difficulty of Paying Living Expenses: Not hard at all  Food Insecurity: No Food Insecurity (08/06/2022)   Hunger Vital Sign    Worried About Running Out of Food in the Last Year: Never true    Ran Out of Food in the Last Year: Never true  Transportation Needs: No Transportation Needs (08/06/2022)   PRAPARE - Hydrologist (Medical): No    Lack of Transportation (Non-Medical): No  Physical Activity: Sufficiently Active (08/06/2022)   Exercise Vital Sign    Days of Exercise per Week: 5 days    Minutes of Exercise per Session: 60 min  Stress: No Stress Concern Present (08/06/2022)   [place]    Feeling of Stress : Not at all  Social Connections: Moderately Integrated (08/06/2022)   Social Connection and Isolation Panel [NHANES]    Frequency of Communication with Friends and Family: More than three times a week    Frequency of Social Gatherings with Friends and Family: Three times a week    Attends Religious Services: More than 4 times per year    Active Member of Clubs or Organizations: Yes    Attends Archivist Meetings: More than 4 times per year    Marital Status: Divorced    Activities of Daily Living    08/06/2022    1:17 PM 07/03/2022    2:57 PM  In your present state of health, do you have any difficulty performing the following activities:  Hearing? 0 0  Vision? 0 0  Difficulty concentrating or making decisions? 0 0  Walking or climbing stairs? 0 0  Dressing or bathing? 0 0  Doing errands, shopping? 0 0  Preparing Food and eating ? N N  Using the Toilet? N N  In the past six months, have you accidently leaked urine? N N  Do you have problems with loss of bowel control? Y Y  Comment has consitpation and has been working with GI doctor. the patient has a hard time with constipation- sees GI specialist  Managing your Medications? N N  Managing your Finances? N N   Housekeeping or managing your Housekeeping? N N    Patient Education/ Literacy How often do you need to have someone help you when you read instructions, pamphlets, or other written materials from your doctor or pharmacy?: 1 - Never What is the last grade level you completed in school?: 2 associates degree  Exercise Current Exercise Habits: Home exercise routine, Type of exercise: walking;Other - see comments (aerobics), Time (Minutes): 60, Frequency (Times/Week): 5, Weekly Exercise (Minutes/Week): 300, Intensity: Moderate, Exercise limited by: cardiac condition(s);orthopedic condition(s)  Diet Patient reports consuming 2 meals a day and 1 snack(s) a day Patient reports that her primary diet is: Regular Patient reports that she does have regular access to food.   Depression Screen    08/06/2022    1:10 PM 02/27/2022   11:55 AM 01/01/2022    3:34 PM 10/16/2021    3:58 PM 09/23/2021   12:20 PM 09/16/2021    9:34 AM 07/21/2021    9:01 AM  PHQ 2/9 Scores  PHQ - 2 Score 0 0 0 0 0 0 0     Fall Risk    08/06/2022    1:10 PM 07/03/2022    2:57 PM 01/01/2022    2:49 PM 01/01/2022    1:39 PM 10/16/2021    3:58 PM  [place] in the past year? 1 0 0 0 1  Number falls in past yr: 0 0 0 0 0  Injury with Fall? 1 0 0 0 0  Risk for fall due to : History of fall(s) Impaired balance/gait Impaired balance/gait;Impaired mobility;Other (Comment) Impaired balance/gait;Impaired mobility History of fall(s);Impaired balance/gait;Impaired mobility  Risk for fall due to: Comment   Pt has episodes of lightheadedness    Follow up Falls evaluation completed;Falls prevention discussed;Education provided Falls evaluation completed;Education provided;Falls prevention discussed Falls evaluation completed Falls evaluation completed Falls evaluation completed;Education provided;Falls prevention discussed     Objective:  ZANYLA KLEBBA seemed alert and oriented and she participated appropriately during  our telephone visit.  Blood Pressure Weight BMI  BP Readings from Last 3 Encounters:  07/13/22 134/84  07/07/22 136/62  06/29/22 124/84   Wt Readings from Last 3 Encounters:  07/07/22 212 lb (96.2 kg)  06/29/22 212 lb 3.2 oz (96.3 kg)  05/21/22 208 lb 3.2 oz (94.4 kg)   BMI Readings from Last 1 Encounters:  07/07/22 40.06 kg/m    *Unable to obtain current vital signs, weight, and BMI due to telephone visit type  Hearing/Vision  Vaughn did not seem to have difficulty with hearing/understanding during the telephone conversation Reports that she has had a formal eye exam by an eye care professional within the past year Reports that she has not had a formal hearing evaluation within the past year *Unable to fully assess hearing and vision during telephone visit type  Cognitive Function:    08/06/2022    1:19 PM 02/25/2021    8:46 AM 01/16/2020   10:30 AM  10/10/2018   11:31 AM 10/05/2017   10:48 AM  6CIT Screen  What Year? 0 points 0 points 0 points 0 points 0 points  What month? 0 points 0 points 0 points 0 points 0 points  What time? 0 points 0 points 0 points 0 points 0 points  Count back from 20 0 points 0 points 0 points 0 points 2 points  Months in reverse 2 points 2 points 2 points 2 points 2 points  Repeat phrase 4 points 2 points 2 points 0 points 6 points  Total Score 6 points 4 points 4 points 2 points 10 points   (Normal:0-7, Significant for Dysfunction: >8)  Normal Cognitive Function Screening: Yes   Immunization & Health Maintenance Record Immunization History  Administered Date(s) Administered   Fluad Quad(high Dose 65+) 04/11/2019, 05/02/2020, 04/18/2021, 04/24/2021   Influenza Split 05/10/2012, 05/04/2014   Influenza Whole 06/14/2009, 05/08/2010, 04/24/2011   Influenza, High Dose Seasonal PF 05/17/2017, 05/24/2018, 04/11/2019, 05/02/2020, 05/30/2022   Influenza,inj,Quad PF,6+ Mos 04/28/2013, 05/04/2014, 05/06/2015, 05/18/2016   Moderna Covid-19 Vaccine  Bivalent Booster 85yr & up 05/30/2022   Moderna SARS-COV2 Booster Vaccination 10/29/2021   Moderna Sars-Covid-2 Vaccination 11/01/2019, 11/29/2019, 07/19/2020   Pneumococcal Conjugate-13 05/25/2014   Pneumococcal Polysaccharide-23 11/28/2012, 12/28/2018   Td 08/24/2004, 03/07/2010   Tdap 07/14/2012   Zoster Recombinat (Shingrix) 10/06/2017, 01/16/2020   Zoster, Live 04/24/2011    Health Maintenance  Topic Date Due   DTaP/Tdap/Td (4 - Td or Tdap) 07/14/2022   COVID-19 Vaccine (5 - 2023-24 season) 08/22/2022 (Originally 07/25/2022)   Diabetic kidney evaluation - Urine ACR  09/23/2022   HEMOGLOBIN A1C  01/05/2023   FOOT EXAM  04/09/2023   OPHTHALMOLOGY EXAM  05/27/2023   Diabetic kidney evaluation - eGFR measurement  07/14/2023   Medicare Annual Wellness (AWV)  08/07/2023   MAMMOGRAM  01/07/2024   COLONOSCOPY (Pts 45-476yrInsurance coverage will need to be confirmed)  04/06/2024   Pneumonia Vaccine 6541Years old  Completed   INFLUENZA VACCINE  Completed   DEXA SCAN  Completed   Hepatitis C Screening  Completed   Zoster Vaccines- Shingrix  Completed   HPV VACCINES  Aged Out       Assessment  This is a routine wellness examination for JuRayann Hogan Health Maintenance: Due or Overdue Health Maintenance Due  Topic Date Due   DTaP/Tdap/Td (4 - Td or Tdap) 07/14/2022    JuRayann Hemanoes not need a referral for Community Assistance: Care Management:   no Social Work:    no Prescription Assistance:  no Nutrition/Diabetes Education:  no   Plan:  Personalized Goals  Goals Addressed               This Visit's Progress     Patient Stated (pt-stated)        Patient would like to get her bowels regular.        Personalized Health Maintenance & Screening Recommendations  Td vaccine  Lung Cancer Screening Recommended: no (Low Dose CT Chest recommended if Age 73-80ears, 30 pack-year currently smoking OR have quit w/in past 15 years) Hepatitis C Screening  recommended: no HIV Screening recommended: no  Advanced Directives: Written information was not prepared per patient's request.  Referrals & Orders No orders of the defined types were placed in this encounter.   Follow-up Plan Follow-up with MeHali MarryMD as planned Schedule tetanus vaccine at the pharmacy. Medicare wellness visit in one year.  AVS printed and mailed  to the patient.   I have personally reviewed and noted the following in the patient's chart:   Medical and social history Use of alcohol, tobacco or illicit drugs  Current medications and supplements Functional ability and status Nutritional status Physical activity Advanced directives List of other physicians Hospitalizations, surgeries, and ER visits in previous 12 months Vitals Screenings to include cognitive, depression, and falls Referrals and appointments  In addition, I have reviewed and discussed with Tanya Hogan certain preventive protocols, quality metrics, and best practice recommendations. A written personalized care plan for preventive services as well as general preventive health recommendations is available and can be mailed to the patient at her request.      Tinnie Gens, RN BSN  08/06/2022

## 2022-08-07 ENCOUNTER — Telehealth: Payer: Self-pay | Admitting: *Deleted

## 2022-08-07 NOTE — Telephone Encounter (Signed)
Pt called back and stated that she will need to keep this appointment because she was in the hospital.

## 2022-08-07 NOTE — Telephone Encounter (Signed)
Called pt and LVM advising her that since we did her A1c in November and it was 5.9 she doesn't need to be seen until March 2024.   Asked that she call the office and reschedule the appointment for Monday December 18th.

## 2022-08-10 ENCOUNTER — Ambulatory Visit (INDEPENDENT_AMBULATORY_CARE_PROVIDER_SITE_OTHER): Payer: Medicare Other | Admitting: Family Medicine

## 2022-08-10 VITALS — BP 116/78 | HR 70 | Ht 61.0 in | Wt 220.0 lb

## 2022-08-10 DIAGNOSIS — E118 Type 2 diabetes mellitus with unspecified complications: Secondary | ICD-10-CM

## 2022-08-10 DIAGNOSIS — R7989 Other specified abnormal findings of blood chemistry: Secondary | ICD-10-CM

## 2022-08-10 DIAGNOSIS — R0781 Pleurodynia: Secondary | ICD-10-CM | POA: Diagnosis not present

## 2022-08-10 DIAGNOSIS — I10 Essential (primary) hypertension: Secondary | ICD-10-CM | POA: Diagnosis not present

## 2022-08-10 DIAGNOSIS — R5383 Other fatigue: Secondary | ICD-10-CM | POA: Diagnosis not present

## 2022-08-10 DIAGNOSIS — I5022 Chronic systolic (congestive) heart failure: Secondary | ICD-10-CM

## 2022-08-10 DIAGNOSIS — N760 Acute vaginitis: Secondary | ICD-10-CM

## 2022-08-10 DIAGNOSIS — R0602 Shortness of breath: Secondary | ICD-10-CM | POA: Diagnosis not present

## 2022-08-10 LAB — POCT URINALYSIS DIP (CLINITEK)
Bilirubin, UA: NEGATIVE
Blood, UA: NEGATIVE
Glucose, UA: >=1000 mg/dL — AB
Ketones, POC UA: NEGATIVE mg/dL
Leukocytes, UA: NEGATIVE
Nitrite, UA: NEGATIVE
POC PROTEIN,UA: NEGATIVE
Spec Grav, UA: 1.02 (ref 1.010–1.025)
Urobilinogen, UA: 0.2 E.U./dL
pH, UA: 5.5 (ref 5.0–8.0)

## 2022-08-10 MED ORDER — METRONIDAZOLE 500 MG PO TABS: 500.0000 mg | ORAL_TABLET | Freq: Two times a day (BID) | ORAL | 0 refills | Status: DC

## 2022-08-10 NOTE — Assessment & Plan Note (Signed)
Discontinue Jardiance for now though I do see some benefit potentially for her heart failure so we may circle back to this class of medication but for now we will discontinue since I do think it is causing some side effects.  Will evaluate for yeast infection.  Will go ahead and restart Ozempic she says she still has some at home so she will restart at 0.25 mg daily.  I like to see her back in about 3 to 4 weeks to make sure that she is improving.

## 2022-08-10 NOTE — Addendum Note (Signed)
Addended by: Beatrice Lecher D on: 08/10/2022 04:29 PM   Modules accepted: Orders

## 2022-08-10 NOTE — Patient Instructions (Addendum)
Go ahead and restart your Ozempic 0.25 mg weekly.  After 1 month of injections and you can go ahead and increase to 0.5 mg. He is discontinue the Jardiance. He is use your albuterol every 4-6 hours during the day to help open up your lungs and your chest. You are using the Advair twice a day and the Spiriva once a day.

## 2022-08-10 NOTE — Assessment & Plan Note (Signed)
Blood pressure is quite elevated today.  She says it has been up and down.  She is taking her Bumex twice a day.  But her weight still seems to be climbing.

## 2022-08-10 NOTE — Progress Notes (Signed)
Established Patient Office Visit  Subjective   Patient ID: Tanya Hogan, female    DOB: 10/29/1950  Age: 71 y.o. MRN: [id number]  Chief Complaint  Patient presents with   Follow-up    HPI  Today for recent ED follow-up.  She had had a fall where she injured her right side.  She went to the ED on member 20th because she was having significant discomfort and pain.  She had complained of increased pain and shortness of breath.  BNP was slightly elevated in the ED so they had to bump up her diuretic for a day to diurese a little bit more.  She says her rib pain is getting a lot better.  She is able to cough now without excruciating pain.  DM- she wants to stop Jardiance and go back on Ozempic.  She feels like she is getting dehydrated and getting yeast in her mouth.  She has noticed that ever since she started the Jardiance she has had a lot more coughing and sputum production.  She feels like it is directly related to the [place].  She is also having some urinary frequency and some vaginal itching as well.  History of chronic systolic heart failure-she has been on Jardiance but feels like she is having significant side effects.  So for right now organ to discontinue the medication and see if we can get her feeling better.  We also increased her diuretic to a whole tab twice a day.  She just reports that she feels really fatigued.    ROS    Objective:     BP 116/78   Pulse 70   Ht 5' 1"  (1.549 m)   Wt 220 lb (99.8 kg)   SpO2 99%   BMI 41.57 kg/m    Physical Exam Vitals and nursing note reviewed.  Constitutional:      Appearance: She is well-developed.  HENT:     Head: Normocephalic and atraumatic.  Cardiovascular:     Rate and Rhythm: Normal rate and regular rhythm.     Heart sounds: Normal heart sounds.  Pulmonary:     Effort: Pulmonary effort is normal.     Breath sounds: Normal breath sounds.  Musculoskeletal:     Comments: Trace ankle edema bilaterally   Skin:    General: Skin is warm and dry.  Neurological:     Mental Status: She is alert and oriented to person, place, and time.  Psychiatric:        Behavior: Behavior normal.      Results for orders placed or performed in visit on 08/10/22  POCT URINALYSIS DIP (CLINITEK)  Result Value Ref Range   Color, UA yellow yellow   Clarity, UA clear clear   Glucose, UA >=1,000 (A) negative mg/dL   Bilirubin, UA negative negative   Ketones, POC UA negative negative mg/dL   Spec Grav, UA 1.020 1.010 - 1.025   Blood, UA negative negative   pH, UA 5.5 5.0 - 8.0   POC PROTEIN,UA negative negative, trace   Urobilinogen, UA 0.2 0.2 or 1.0 E.U./dL   Nitrite, UA Negative Negative   Leukocytes, UA Negative Negative      The 10-year ASCVD risk score (Arnett DK, et al., 2019) is: 25%    Assessment & Plan:   Problem List Items Addressed This Visit       Cardiovascular and Mediastinum   Chronic systolic heart failure (HCC)    Recheck BNP again since she has been  taking her diuretic BID.        Benign essential hypertension    Blood pressure is quite elevated today.  She says it has been up and down.  She is taking her Bumex twice a day.  But her weight still seems to be climbing.      Relevant Orders   CBC   B Nat Peptide   BASIC METABOLIC PANEL WITH GFR     Endocrine   Controlled diabetes mellitus type 2 with complications ([place])    Discontinue Jardiance for now though I do see some benefit potentially for her heart failure so we may circle back to this class of medication but for now we will discontinue since I do think it is causing some side effects.  Will evaluate for yeast infection.  Will go ahead and restart Ozempic she says she still has some at home so she will restart at 0.25 mg daily.  I like to see her back in about 3 to 4 weeks to make sure that she is improving.        Other   Fatigue   Relevant Orders   CBC   B Nat Peptide   BASIC METABOLIC PANEL WITH GFR    Other Visit Diagnoses     Acute vaginitis    -  Primary   Relevant Orders   POCT URINALYSIS DIP (CLINITEK) (Completed)   WET PREP FOR TRICH, YEAST, CLUE   Rib pain       Elevated brain natriuretic peptide (BNP) level       Relevant Orders   CBC   B Nat Peptide   BASIC METABOLIC PANEL WITH GFR      Vaginitis-will perform wet prep she is at risk for yeast infection with being on Jardiance which we are going to discontinue currently.    Return in about 2 weeks (around 08/24/2022) for shortness of breath, restart Ozempic.  Beatrice Lecher, MD

## 2022-08-10 NOTE — Assessment & Plan Note (Signed)
Recheck BNP again since she has been taking her diuretic BID.

## 2022-08-10 NOTE — Progress Notes (Signed)
Hi Yaminah, they did not see any yeast but did see some overgrowth of bacteria some get a send in a prescription so that we can try to get that cleared up.  Orders Placed This Encounter     metroNIDAZOLE (FLAGYL) 500 MG tablet         Sig: Take 1 tablet (500 mg total) by mouth 2 (two) times daily.         Dispense:  14 tablet         Refill:  0

## 2022-08-11 LAB — BRAIN NATRIURETIC PEPTIDE: Brain Natriuretic Peptide: 193 pg/mL — ABNORMAL HIGH (ref <100)

## 2022-08-11 LAB — BASIC METABOLIC PANEL WITH GFR
BUN: 14 mg/dL (ref 7–25)
CO2: 25 mmol/L (ref 20–32)
Calcium: 10 mg/dL (ref 8.6–10.4)
Chloride: 108 mmol/L (ref 98–110)
Creat: 0.84 mg/dL (ref 0.60–1.00)
Glucose, Bld: 91 mg/dL (ref 65–99)
Potassium: 5.2 mmol/L (ref 3.5–5.3)
Sodium: 140 mmol/L (ref 135–146)
eGFR: 73 mL/min/{1.73_m2} (ref >=60)

## 2022-08-11 LAB — CBC
HCT: 43.5 % (ref 35.0–45.0)
Hemoglobin: 14.5 g/dL (ref 11.7–15.5)
MCH: 32.4 pg (ref 27.0–33.0)
MCHC: 33.3 g/dL (ref 32.0–36.0)
MCV: 97.1 fL (ref 80.0–100.0)
MPV: 11.1 fL (ref 7.5–12.5)
Platelets: 276 10*3/uL (ref 140–400)
RBC: 4.48 10*6/uL (ref 3.80–5.10)
RDW: 12.1 % (ref 11.0–15.0)
WBC: 7 10*3/uL (ref 3.8–10.8)

## 2022-08-11 NOTE — Progress Notes (Signed)
Hi Tanya Hogan, the BNP which tells [place] if you are still have too much fluid on board is still a little elevated but it does look much better.  If your cough is not improving by the end of the week after stopping the Jardiance then please let me know.

## 2022-08-13 LAB — WET PREP FOR TRICH, YEAST, CLUE
MICRO NUMBER:: 14328054
Specimen Quality: ADEQUATE

## 2022-08-14 ENCOUNTER — Encounter: Payer: Self-pay | Admitting: Family Medicine

## 2022-08-19 ENCOUNTER — Telehealth: Payer: Self-pay | Admitting: Cardiology

## 2022-08-19 NOTE — Telephone Encounter (Signed)
Pt reports PCP stopped Jardiance and restarted Ozempic 0.25 the lowest dose. PCP increased fluid pill for a few days d/t weight gain and productive cough. Please see my chart encounter from 08/14/22.    VS this AM 149/98-76 112 lbs while on phone pt wt 115 lbs.  Reports ate more salt than normal over the Christmas holiday.  Is trying to stick to PO fluid intake of 50 oz per PCP.    Will send to Dr. Johney Frame to review.

## 2022-08-19 NOTE — Telephone Encounter (Signed)
Pt c/o medication issue:  1. Name of Medication:   Semaglutide,0.25 or 0.5MG/DOS, (OZEMPIC, 0.25 OR 0.5 MG/DOSE,) 2 MG/1.5ML SOPN    2. How are you currently taking this medication (dosage and times per day)?   3. Are you having a reaction (difficulty breathing--STAT)?   4. What is your medication issue? Pt wanted to let Dr. Johney Frame know that her PCP took her off of Jardiance due to dehydration and they put her back on ozempic.

## 2022-08-23 DIAGNOSIS — E785 Hyperlipidemia, unspecified: Secondary | ICD-10-CM

## 2022-08-23 DIAGNOSIS — E118 Type 2 diabetes mellitus with unspecified complications: Secondary | ICD-10-CM

## 2022-08-23 DIAGNOSIS — E1169 Type 2 diabetes mellitus with other specified complication: Secondary | ICD-10-CM

## 2022-08-23 DIAGNOSIS — I10 Essential (primary) hypertension: Secondary | ICD-10-CM

## 2022-08-25 ENCOUNTER — Telehealth: Payer: Self-pay | Admitting: Family Medicine

## 2022-08-25 ENCOUNTER — Ambulatory Visit (INDEPENDENT_AMBULATORY_CARE_PROVIDER_SITE_OTHER): Payer: Medicare Other | Admitting: Family Medicine

## 2022-08-25 ENCOUNTER — Encounter: Payer: Self-pay | Admitting: Family Medicine

## 2022-08-25 VITALS — BP 130/79 | HR 79 | Ht 61.0 in | Wt 204.0 lb

## 2022-08-25 DIAGNOSIS — I10 Essential (primary) hypertension: Secondary | ICD-10-CM

## 2022-08-25 DIAGNOSIS — I5022 Chronic systolic (congestive) heart failure: Secondary | ICD-10-CM | POA: Diagnosis not present

## 2022-08-25 DIAGNOSIS — E118 Type 2 diabetes mellitus with unspecified complications: Secondary | ICD-10-CM

## 2022-08-25 MED ORDER — GLUCOSE BLOOD VI STRP: ORAL_STRIP | 12 refills | Status: DC

## 2022-08-25 MED ORDER — BUMETANIDE 1 MG PO TABS: ORAL_TABLET | ORAL | 1 refills | Status: DC

## 2022-08-25 NOTE — Assessment & Plan Note (Signed)
Pressure looks good today.

## 2022-08-25 NOTE — Progress Notes (Signed)
Established Patient Office Visit  Subjective   Patient ID: Tanya Hogan, female    DOB: 1950/10/16  Age: 72 y.o. MRN: [id number]  Chief Complaint  Patient presents with   Follow-up         HPI Follow-up systolic heart failure-on the last saw her she was more short of breath and volume overloaded.  She is currently taking 1 tab in the morning and 2 tabs in the evening.  Diabetes-she did restart her Ozempic. She has been working out and reports no appetite.   She has had a very dry mouth.    She tried to restart the jardiance and says that it made her back and kidneys hurt.  She just did not feel good on it so she stopped the medication.  But she is taking her furosemide.  Has been working out and going to the gym 5 days/week she has been doing a 45-minute aerobic class.  For her breathing she did try Breo with the pulmonologist but says that she felt like it was not working so she is back on Advair and Spiriva.    ROS    Objective:     BP 130/79   Pulse 79   Ht '5\' 1"'$  (1.549 m)   Wt 204 lb (92.5 kg)   SpO2 93%   BMI 38.55 kg/m    Physical Exam Vitals and nursing note reviewed.  Constitutional:      Appearance: She is well-developed.  HENT:     Head: Normocephalic and atraumatic.  Cardiovascular:     Rate and Rhythm: Normal rate and regular rhythm.     Heart sounds: Normal heart sounds.  Pulmonary:     Effort: Pulmonary effort is normal.     Breath sounds: Normal breath sounds.  Skin:    General: Skin is warm and dry.  Neurological:     Mental Status: She is alert and oriented to person, place, and time.  Psychiatric:        Behavior: Behavior normal.      No results found for any visits on 08/25/22.    The 10-year ASCVD risk score (Arnett DK, et al., 2019) is: 30.4%    Assessment & Plan:   Problem List Items Addressed This Visit       Cardiovascular and Mediastinum   Chronic systolic heart failure ([place]) - Primary    Discussed looking at a  BMP today to see if maybe she is a little bit on the dry side. We may be able to dec her diuretic to one in AM and 1.5 in PM.  Will call with results.  No sign of volume overload on exam today.  She is off of Jardiance she feels like she was not able to tolerate it.      Relevant Medications   bumetanide (BUMEX) 1 MG tablet   Other Relevant Orders   BASIC METABOLIC PANEL WITH GFR   Benign essential hypertension    Pressure looks good today.      Relevant Medications   bumetanide (BUMEX) 1 MG tablet     Endocrine   Controlled diabetes mellitus type 2 with complications ([place])    Went well back on Ozempic.  Her weight is down significantly but some of this may also just be diuresis.      Relevant Medications   glucose blood test strip   Other Relevant Orders   BASIC METABOLIC PANEL WITH GFR    Return in about 2 months (around  10/24/2022) for Diabetes follow-up.    Beatrice Lecher, MD

## 2022-08-25 NOTE — Assessment & Plan Note (Signed)
Went well back on Ozempic.  Her weight is down significantly but some of this may also just be diuresis.

## 2022-08-25 NOTE — Telephone Encounter (Signed)
Patient dropped off Rx assistance forms to be completed. Forms placed in [facility].

## 2022-08-25 NOTE — Assessment & Plan Note (Addendum)
Discussed looking at a BMP today to see if maybe she is a little bit on the dry side. We may be able to dec her diuretic to one in AM and 1.5 in PM.  Will call with results.  No sign of volume overload on exam today.  She is off of Jardiance she feels like she was not able to tolerate it.

## 2022-08-26 LAB — BASIC METABOLIC PANEL WITH GFR
BUN: 13 mg/dL (ref 7–25)
CO2: 26 mmol/L (ref 20–32)
Calcium: 9.7 mg/dL (ref 8.6–10.4)
Chloride: 103 mmol/L (ref 98–110)
Creat: 0.88 mg/dL (ref 0.60–1.00)
Glucose, Bld: 99 mg/dL (ref 65–99)
Potassium: 3.9 mmol/L (ref 3.5–5.3)
Sodium: 142 mmol/L (ref 135–146)
eGFR: 69 mL/min/{1.73_m2} (ref >=60)

## 2022-08-26 NOTE — Progress Notes (Signed)
Your lab work is within acceptable range and there are no concerning findings.   ?

## 2022-08-28 ENCOUNTER — Encounter: Payer: Self-pay | Admitting: Family Medicine

## 2022-08-28 DIAGNOSIS — J449 Chronic obstructive pulmonary disease, unspecified: Secondary | ICD-10-CM | POA: Diagnosis not present

## 2022-08-28 DIAGNOSIS — E785 Hyperlipidemia, unspecified: Secondary | ICD-10-CM | POA: Diagnosis not present

## 2022-08-28 DIAGNOSIS — I471 Supraventricular tachycardia, unspecified: Secondary | ICD-10-CM | POA: Diagnosis not present

## 2022-08-28 DIAGNOSIS — E876 Hypokalemia: Secondary | ICD-10-CM | POA: Diagnosis not present

## 2022-08-28 DIAGNOSIS — R9431 Abnormal electrocardiogram [ECG] [EKG]: Secondary | ICD-10-CM | POA: Diagnosis not present

## 2022-08-28 DIAGNOSIS — N189 Chronic kidney disease, unspecified: Secondary | ICD-10-CM | POA: Diagnosis not present

## 2022-08-28 DIAGNOSIS — Z87891 Personal history of nicotine dependence: Secondary | ICD-10-CM | POA: Diagnosis not present

## 2022-08-28 DIAGNOSIS — Z91011 Allergy to milk products: Secondary | ICD-10-CM | POA: Diagnosis not present

## 2022-08-28 DIAGNOSIS — I428 Other cardiomyopathies: Secondary | ICD-10-CM | POA: Diagnosis not present

## 2022-08-28 DIAGNOSIS — J438 Other emphysema: Secondary | ICD-10-CM | POA: Diagnosis not present

## 2022-08-28 DIAGNOSIS — I081 Rheumatic disorders of both mitral and tricuspid valves: Secondary | ICD-10-CM | POA: Diagnosis not present

## 2022-08-28 DIAGNOSIS — Z7985 Long-term (current) use of injectable non-insulin antidiabetic drugs: Secondary | ICD-10-CM | POA: Diagnosis not present

## 2022-08-28 DIAGNOSIS — Z91041 Radiographic dye allergy status: Secondary | ICD-10-CM | POA: Diagnosis not present

## 2022-08-28 DIAGNOSIS — I472 Ventricular tachycardia, unspecified: Secondary | ICD-10-CM | POA: Diagnosis not present

## 2022-08-28 DIAGNOSIS — E119 Type 2 diabetes mellitus without complications: Secondary | ICD-10-CM | POA: Diagnosis not present

## 2022-08-28 DIAGNOSIS — I130 Hypertensive heart and chronic kidney disease with heart failure and stage 1 through stage 4 chronic kidney disease, or unspecified chronic kidney disease: Secondary | ICD-10-CM | POA: Diagnosis not present

## 2022-08-28 DIAGNOSIS — I340 Nonrheumatic mitral (valve) insufficiency: Secondary | ICD-10-CM | POA: Diagnosis not present

## 2022-08-28 DIAGNOSIS — E1122 Type 2 diabetes mellitus with diabetic chronic kidney disease: Secondary | ICD-10-CM | POA: Diagnosis not present

## 2022-08-28 DIAGNOSIS — K7469 Other cirrhosis of liver: Secondary | ICD-10-CM | POA: Diagnosis not present

## 2022-08-28 DIAGNOSIS — I509 Heart failure, unspecified: Secondary | ICD-10-CM | POA: Diagnosis not present

## 2022-08-28 DIAGNOSIS — I214 Non-ST elevation (NSTEMI) myocardial infarction: Secondary | ICD-10-CM | POA: Diagnosis not present

## 2022-08-28 DIAGNOSIS — I110 Hypertensive heart disease with heart failure: Secondary | ICD-10-CM | POA: Diagnosis not present

## 2022-08-28 DIAGNOSIS — K581 Irritable bowel syndrome with constipation: Secondary | ICD-10-CM | POA: Diagnosis not present

## 2022-08-28 DIAGNOSIS — R079 Chest pain, unspecified: Secondary | ICD-10-CM | POA: Diagnosis not present

## 2022-08-28 DIAGNOSIS — K219 Gastro-esophageal reflux disease without esophagitis: Secondary | ICD-10-CM | POA: Diagnosis not present

## 2022-08-28 DIAGNOSIS — I5022 Chronic systolic (congestive) heart failure: Secondary | ICD-10-CM | POA: Diagnosis not present

## 2022-08-28 DIAGNOSIS — Z923 Personal history of irradiation: Secondary | ICD-10-CM | POA: Diagnosis not present

## 2022-08-28 DIAGNOSIS — Z950 Presence of cardiac pacemaker: Secondary | ICD-10-CM | POA: Diagnosis not present

## 2022-08-28 DIAGNOSIS — I442 Atrioventricular block, complete: Secondary | ICD-10-CM | POA: Diagnosis not present

## 2022-08-28 DIAGNOSIS — I5189 Other ill-defined heart diseases: Secondary | ICD-10-CM | POA: Diagnosis not present

## 2022-08-28 DIAGNOSIS — G4733 Obstructive sleep apnea (adult) (pediatric): Secondary | ICD-10-CM | POA: Diagnosis not present

## 2022-08-28 DIAGNOSIS — E039 Hypothyroidism, unspecified: Secondary | ICD-10-CM | POA: Diagnosis not present

## 2022-08-28 DIAGNOSIS — I517 Cardiomegaly: Secondary | ICD-10-CM | POA: Diagnosis not present

## 2022-08-31 ENCOUNTER — Telehealth: Payer: Self-pay | Admitting: Internal Medicine

## 2022-08-31 NOTE — Telephone Encounter (Signed)
Call / request received in [facility] Triage.    Text message sent to Dr. Lovena Le in the Cath Lab 08/31/2022 at 1208 pm, asking him to touch base with Dr Seabron Spates, when he has a free moment, sometime today.  [phone].    Follow up required.

## 2022-08-31 NOTE — Telephone Encounter (Signed)
Dr. Delice Bison a cardiologist with [facility] is calling requesting a callback from Dr. Lovena Le at some point today regarding this patient. Please advise.

## 2022-09-01 ENCOUNTER — Telehealth: Payer: Medicare Other

## 2022-09-01 NOTE — Telephone Encounter (Signed)
Message given to Dr. Lovena Le, provider Dr Delice Bison contacted.  See Dr. Lovena Le notes. 09/01/2022

## 2022-09-02 ENCOUNTER — Encounter: Payer: Self-pay | Admitting: Hematology and Oncology

## 2022-09-02 NOTE — Progress Notes (Signed)
Remote pacemaker transmission.   

## 2022-09-04 ENCOUNTER — Ambulatory Visit (INDEPENDENT_AMBULATORY_CARE_PROVIDER_SITE_OTHER): Payer: 59

## 2022-09-04 ENCOUNTER — Telehealth: Payer: Medicare Other

## 2022-09-04 DIAGNOSIS — I5022 Chronic systolic (congestive) heart failure: Secondary | ICD-10-CM

## 2022-09-04 DIAGNOSIS — E118 Type 2 diabetes mellitus with unspecified complications: Secondary | ICD-10-CM

## 2022-09-04 NOTE — Chronic Care Management (AMB) (Signed)
   09/04/2022  ZENA VITELLI [birth year] [id number]  Transition Care Management Follow-up Telephone Call Date of discharge and from where: 09-03-2022, [facility] How have you been since you were released from the hospital? Tired and "trying to behave" Any questions or concerns? Yes- Wants to make sure she is taking her medications right and unsure what to do about upgrading her pacemaker  Items Reviewed: Did the pt receive and understand the discharge instructions provided? Yes  Medications obtained and verified? Yes  Other? Yes  the patient states she does not have her Entresto yet. Has her paperwork and will read over again well Any new allergies since your discharge? No  Dietary orders reviewed? Yes Do you have support at home? Yes   Home Care and Equipment/Supplies: Were home health services ordered? no If so, what is the name of the agency? N/A  Has the agency set up a time to come to the patient's home? not applicable Were any new equipment or medical supplies ordered?  No What is the name of the medical supply agency? N/A Were you able to get the supplies/equipment? not applicable Do you have any questions related to the use of the equipment or supplies? No  Functional Questionnaire: (I = Independent and D = Dependent) ADLs: I  Bathing/Dressing- I  Meal Prep- I  Eating- I  Maintaining continence- I  Transferring/Ambulation- I  Managing Meds- I  Follow up appointments reviewed:  PCP Hospital f/u appt confirmed? Yes  Scheduled to see Dr. Madilyn Fireman on 09-07-2022 @ 1130 am. [facility] f/u appt confirmed? Yes  Scheduled to see [facility] on 09-09-2022 at  10:55. Are transportation arrangements needed? No  If their condition worsens, is the pt aware to call PCP or go to the Emergency Dept.? Yes Was the patient provided with contact information for the PCP's office or ED? Yes Was to pt encouraged to call back with questions or concerns? Yes

## 2022-09-04 NOTE — Chronic Care Management (AMB) (Signed)
Chronic Care Management   CCM RN Visit Note  09/04/2022 Name: Tanya Hogan MRN: [id number] DOB: [birth year]  Subjective: Tanya Hogan is a 72 y.o. year old female who is a primary care patient of [name], Rene Kocher, MD. The patient was referred to the Chronic Care Management team for assistance with care management needs subsequent to provider initiation of CCM services and plan of care.    Today's Visit:  Engaged with patient by telephone for follow up visit and post discharge follow up for discharge on 09-03-2022       Goals Addressed             This Visit's Progress    CCM Expected Outcome:  Monitor, Self-Manage and Reduce Symptoms of Diabetes       Current Barriers:  Knowledge Deficits related to constipation and having to change medications to see if constipation is resolved and blood sugar elevations after medication changes Chronic Disease Management support and education needs related to effective management of DM Lab Results  Component Value Date   HGBA1C 5.9 (H) 07/07/2022     Planned Interventions: Provided education to patient about basic DM disease process. The patient states her blood sugars have been doing good post hospitalization. ; Reviewed medications with patient and discussed importance of medication adherence. The patient states she has stopped taking Ozempic and her blood sugars are staying stable. Plans to talk to pcp about this at her upcoming visit on Monday;        Reviewed prescribed diet with patient heart healthy/ADA. Review and education provided; Counseled on importance of regular laboratory monitoring as prescribed;        Discussed plans with patient for ongoing care management follow up and provided patient with direct contact information for care management team;      Provided patient with written educational materials related to hypo and hyperglycemia and importance of correct treatment. Denies any issues with hypo or hyperglycemia;        Reviewed scheduled/upcoming provider appointments including: 09-07-2022 at 1130 am ;         Advised patient, providing education and rationale, to check cbg twice daily and when you have symptoms of low or high blood sugar and record. Currently the patient is checking her blood sugars TID per instructions by discharging provider. She states they told her to do this until she sees pcp for further instructions. States her blood sugar this am was 119 and has been around 104 to 119.       Referral made to pharmacy team for assistance with PAP for DM medications. The patient has a follow up with pharm D on 09-08-2022. Reminder provided today.        Review of patient status, including review of consultants reports, relevant laboratory and other test results, and medications completed;       Advised patient to discuss changes in DM, questions, or concerns with provider;      Screening for signs and symptoms of depression related to chronic disease state;        Assessed social determinant of health barriers;  Encouraged the patient to write down question to take with her to her upcoming appointment on Monday to see the pcp. The patient wants to make sure she understands fully before making any decisions about upgrading her pacemaker.        Symptom Management: Take medications as prescribed   Attend all scheduled provider appointments Call provider office for new concerns or  questions  call the Suicide and Crisis Lifeline: 988 call the [place] National Suicide Prevention Lifeline: 6205223817 or TTY: 765-207-1668 TTY 737-746-4374) to talk to a trained counselor call 1-800-273-TALK (toll free, 24 hour hotline) if experiencing a Mental Health or [place]  check feet daily for cuts, sores or redness enter blood sugar readings and medication or insulin into daily log take the blood sugar log to all doctor visits trim toenails straight across manage portion size keep feet up while  sitting wash and dry feet carefully every day wear comfortable, cotton socks wear comfortable, well-fitting shoes  Follow Up Plan: Telephone follow up appointment with care management team member scheduled for: 09-25-2022 at 1 pm       CCM Expected Outcome:  Monitor, Self-Manage and Reduce Symptoms of Heart Failure       Current Barriers:  Knowledge Deficits related to the biventricular pacing system and the generator changes and fully understanding the pacing system and how it works Chronic Disease Management support and education needs related to effective management of HF EF% 20-25 % in May of 2023 Pacemaker generator setting changed in May of 2023. During hospitalization they discussed her upgrading her pacemaker to an ICD- the patient is unsure if she wants to do this Post discharge from the hospital for SVT 08-28-2022 to 09-03-2022  Wt Readings from Last 3 Encounters:  08/25/22 204 lb (92.5 kg)  08/10/22 220 lb (99.8 kg)  07/07/22 212 lb (96.2 kg)     Planned Interventions: Basic overview and discussion of pathophysiology of Heart Failure reviewed. Patient states her dry weight is 208 and this is good for her. She is weight currently TID since discharge from the hospital. She states that she will follow up with the pcp on 09-07-2022 and had an incoming call from cardiologist today during the outreach. The patient states they want to upgrade her pacemaker to an ICD. Provided education on low sodium diet/ADA. Education and review. The patient is compliant with dietary restrictions.  Reviewed Heart Failure Action Plan in depth and provided written copy Assessed need for readable accurate scales in home. Post hospitalization she is weight TID. She has had changes in her medications and has her medications.  Provided education about placing scale on hard, flat surface Advised patient to weigh each morning after emptying bladder Discussed importance of daily weight and advised patient to weigh  and record daily. The patient is compliant with weighing at this time. She states that she has lost a lot of weight with some of it being fluid Reviewed role of diuretics in prevention of fluid overload and management of heart failure. Review of medications. She has her discharge paperwork. The patient does not think she has the Entresto 24/26 mg and she did take her Bumex last night but not this am. Read over the discharge paperwork available and the patient states she will read back over it also. Review of taking paperwork and medications with her to her follow up appointment on Monday and having the pcp give her parameters on when to take the Bumex and other questions she may have concerning medications.  Education and support provided. Has a follow up with the pharm D on Tuesday.  Discussed the importance of keeping all appointments with provider. Follow up post discharge from the hospital with pcp on 09-07-2022 at 1130 am Provided patient with education about the role of exercise in the management of heart failure. The patient has been doing aerobics 5 days a week.  Is currently at home resting post discharge from the hospital.  Advised patient to discuss changes in HF or other  with provider Screening for signs and symptoms of depression related to chronic disease state  Assessed social determinant of health barriers  Symptom Management: Take medications as prescribed   Attend all scheduled provider appointments Call provider office for new concerns or questions  call the Suicide and Crisis Lifeline: 988 call the [place] National Suicide Prevention Lifeline: (802)319-7671 or TTY: 220-843-8320 TTY (623)578-2242) to talk to a trained counselor call 1-800-273-TALK (toll free, 24 hour hotline) if experiencing a Mental Health or [place]  call office if I gain more than 2 pounds in one day or 5 pounds in one week keep legs up while sitting track weight in diary use salt in  moderation watch for swelling in feet, ankles and legs every day weigh myself daily begin a heart failure diary bring diary to all appointments develop a rescue plan follow rescue plan if symptoms flare-up track symptoms and what helps feel better or worse dress right for the weather, hot or cold  Follow Up Plan: Telephone follow up appointment with care management team member scheduled for: 09-25-2022 at 1 pm          Plan:Telephone follow up appointment with care management team member scheduled for:  09-25-2022 at 1 pm  Noreene Larsson RN, MSN, CCM RN Care Manager  Chronic Care Management Direct Number: [phone]

## 2022-09-04 NOTE — Patient Instructions (Signed)
Please call the care guide team at [phone] if you need to cancel or reschedule your appointment.   If you are experiencing a Mental Health or [place] or need someone to talk to, please call the Suicide and Crisis Lifeline: 988 call the [place] National Suicide Prevention Lifeline: (415) 670-3292 or TTY: 947-082-9018 TTY (716) 484-4365) to talk to a trained counselor call 1-800-273-TALK (toll free, 24 hour hotline)   Following is a copy of the CCM Program Consent:  CCM service includes personalized support from designated clinical staff supervised by the physician, including individualized plan of care and coordination with other care providers 24/7 contact phone numbers for assistance for urgent and routine care needs. Service will only be billed when office clinical staff spend 20 minutes or more in a month to coordinate care. Only one practitioner may furnish and bill the service in a calendar month. The patient may stop CCM services at amy time (effective at the end of the month) by phone call to the office staff. The patient will be responsible for cost sharing (co-pay) or up to 20% of the service fee (after annual deductible is met)  Following is a copy of your full provider care plan:   Goals Addressed             This Visit's Progress    CCM Expected Outcome:  Monitor, Self-Manage and Reduce Symptoms of Diabetes       Current Barriers:  Knowledge Deficits related to constipation and having to change medications to see if constipation is resolved and blood sugar elevations after medication changes Chronic Disease Management support and education needs related to effective management of DM Lab Results  Component Value Date   HGBA1C 5.9 (H) 07/07/2022     Planned Interventions: Provided education to patient about basic DM disease process. The patient states her blood sugars have been doing good post hospitalization. ; Reviewed medications with patient and  discussed importance of medication adherence. The patient states she has stopped taking Ozempic and her blood sugars are staying stable. Plans to talk to pcp about this at her upcoming visit on Monday;        Reviewed prescribed diet with patient heart healthy/ADA. Review and education provided; Counseled on importance of regular laboratory monitoring as prescribed;        Discussed plans with patient for ongoing care management follow up and provided patient with direct contact information for care management team;      Provided patient with written educational materials related to hypo and hyperglycemia and importance of correct treatment. Denies any issues with hypo or hyperglycemia;       Reviewed scheduled/upcoming provider appointments including: 09-07-2022 at 1130 am ;         Advised patient, providing education and rationale, to check cbg twice daily and when you have symptoms of low or high blood sugar and record. Currently the patient is checking her blood sugars TID per instructions by discharging provider. She states they told her to do this until she sees pcp for further instructions. States her blood sugar this am was 119 and has been around 104 to 119.       Referral made to pharmacy team for assistance with PAP for DM medications. The patient has a follow up with pharm D on 09-08-2022. Reminder provided today.        Review of patient status, including review of consultants reports, relevant laboratory and other test results, and medications completed;  Advised patient to discuss changes in DM, questions, or concerns with provider;      Screening for signs and symptoms of depression related to chronic disease state;        Assessed social determinant of health barriers;  Encouraged the patient to write down question to take with her to her upcoming appointment on Monday to see the pcp. The patient wants to make sure she understands fully before making any decisions about upgrading her  pacemaker.        Symptom Management: Take medications as prescribed   Attend all scheduled provider appointments Call provider office for new concerns or questions  call the Suicide and Crisis Lifeline: 988 call the [place] National Suicide Prevention Lifeline: 848-857-5163 or TTY: 442-823-5732 TTY 657-667-0348) to talk to a trained counselor call 1-800-273-TALK (toll free, 24 hour hotline) if experiencing a Mental Health or [place]  check feet daily for cuts, sores or redness enter blood sugar readings and medication or insulin into daily log take the blood sugar log to all doctor visits trim toenails straight across manage portion size keep feet up while sitting wash and dry feet carefully every day wear comfortable, cotton socks wear comfortable, well-fitting shoes  Follow Up Plan: Telephone follow up appointment with care management team member scheduled for: 09-25-2022 at 1 pm       CCM Expected Outcome:  Monitor, Self-Manage and Reduce Symptoms of Heart Failure       Current Barriers:  Knowledge Deficits related to the biventricular pacing system and the generator changes and fully understanding the pacing system and how it works Chronic Disease Management support and education needs related to effective management of HF EF% 20-25 % in May of 2023 Pacemaker generator setting changed in May of 2023. During hospitalization they discussed her upgrading her pacemaker to an ICD- the patient is unsure if she wants to do this Post discharge from the hospital for SVT 08-28-2022 to 09-03-2022  Wt Readings from Last 3 Encounters:  08/25/22 204 lb (92.5 kg)  08/10/22 220 lb (99.8 kg)  07/07/22 212 lb (96.2 kg)     Planned Interventions: Basic overview and discussion of pathophysiology of Heart Failure reviewed. Patient states her dry weight is 208 and this is good for her. She is weight currently TID since discharge from the hospital. She states that she will follow up  with the pcp on 09-07-2022 and had an incoming call from cardiologist today during the outreach. The patient states they want to upgrade her pacemaker to an ICD. Provided education on low sodium diet/ADA. Education and review. The patient is compliant with dietary restrictions.  Reviewed Heart Failure Action Plan in depth and provided written copy Assessed need for readable accurate scales in home. Post hospitalization she is weight TID. She has had changes in her medications and has her medications.  Provided education about placing scale on hard, flat surface Advised patient to weigh each morning after emptying bladder Discussed importance of daily weight and advised patient to weigh and record daily. The patient is compliant with weighing at this time. She states that she has lost a lot of weight with some of it being fluid Reviewed role of diuretics in prevention of fluid overload and management of heart failure. Review of medications. She has her discharge paperwork. The patient does not think she has the Entresto 24/26 mg and she did take her Bumex last night but not this am. Read over the discharge paperwork available and the patient  states she will read back over it also. Review of taking paperwork and medications with her to her follow up appointment on Monday and having the pcp give her parameters on when to take the Bumex and other questions she may have concerning medications.  Education and support provided. Has a follow up with the pharm D on Tuesday.  Discussed the importance of keeping all appointments with provider. Follow up post discharge from the hospital with pcp on 09-07-2022 at 1130 am Provided patient with education about the role of exercise in the management of heart failure. The patient has been doing aerobics 5 days a week. Is currently at home resting post discharge from the hospital.  Advised patient to discuss changes in HF or other  with provider Screening for signs and  symptoms of depression related to chronic disease state  Assessed social determinant of health barriers  Symptom Management: Take medications as prescribed   Attend all scheduled provider appointments Call provider office for new concerns or questions  call the Suicide and Crisis Lifeline: 988 call the [place] National Suicide Prevention Lifeline: (224)166-1785 or TTY: 985 685 5611 TTY 339-528-7833) to talk to a trained counselor call 1-800-273-TALK (toll free, 24 hour hotline) if experiencing a Mental Health or [place]  call office if I gain more than 2 pounds in one day or 5 pounds in one week keep legs up while sitting track weight in diary use salt in moderation watch for swelling in feet, ankles and legs every day weigh myself daily begin a heart failure diary bring diary to all appointments develop a rescue plan follow rescue plan if symptoms flare-up track symptoms and what helps feel better or worse dress right for the weather, hot or cold  Follow Up Plan: Telephone follow up appointment with care management team member scheduled for: 09-25-2022 at 1 pm          Patient verbalizes understanding of instructions and care plan provided today and agrees to view in [place]. Active MyChart status and patient understanding of how to access instructions and care plan via MyChart confirmed with patient.     Telephone follow up appointment with care management team member scheduled for: 09-25-2022 at 1 pm

## 2022-09-07 ENCOUNTER — Encounter: Payer: Self-pay | Admitting: Family Medicine

## 2022-09-07 ENCOUNTER — Ambulatory Visit (INDEPENDENT_AMBULATORY_CARE_PROVIDER_SITE_OTHER): Payer: 59 | Admitting: Family Medicine

## 2022-09-07 VITALS — BP 114/75 | HR 64 | Ht 61.0 in | Wt 216.0 lb

## 2022-09-07 DIAGNOSIS — I5022 Chronic systolic (congestive) heart failure: Secondary | ICD-10-CM

## 2022-09-07 DIAGNOSIS — I10 Essential (primary) hypertension: Secondary | ICD-10-CM | POA: Diagnosis not present

## 2022-09-07 NOTE — Progress Notes (Signed)
   Established Patient Office Visit  Subjective   Patient ID: Tanya Hogan, female    DOB: [birth year]  Age: 72 y.o. MRN: [id number]  Chief Complaint  Patient presents with   Hospitalization Follow-up    Admitted for SVT    HPI  Hospital f/u for SVT.  He was admitted on January 5 and discharged home on January 11 for a 6-day hospital stay for SVT.  She has a history of stage II systolic congestive heart failure with an EF of 20% as well as a pacemaker.  She developed sudden onset of rapid heart rate while walking in the grocery store with lightheadedness nausea and chest pain and presented to the ER after driving herself there.  She had symptomatic V. tach and was cardioverted to a pacemaker rhythm with IV amiodarone.  They discharged her home on amiodarone home.  She has a follow-up with Dr. Crissie Sickles on Wednesday at [place].  They discontinued her pravastatin and switched her to Crestor and increase the dose of her Coreg.  She had previously tried [place] and spironolactone and did not tolerate either secondary to hypotension and dizziness.  She was started on Entresto as well.  He reports she still just feels tired and exhausted.    ROS    Objective:     BP 114/75   Pulse 64   Ht '5\' 1"'$  (1.549 m)   Wt 216 lb (98 kg)   SpO2 97%   BMI 40.81 kg/m    Physical Exam Vitals and nursing note reviewed.  Constitutional:      Appearance: She is well-developed.  HENT:     Head: Normocephalic and atraumatic.  Cardiovascular:     Rate and Rhythm: Normal rate and regular rhythm.     Heart sounds: Normal heart sounds.  Pulmonary:     Effort: Pulmonary effort is normal.     Breath sounds: Normal breath sounds.  Skin:    General: Skin is warm and dry.  Neurological:     Mental Status: She is alert and oriented to person, place, and time.  Psychiatric:        Behavior: Behavior normal.      No results found for any visits on 09/07/22.    The ASCVD Risk score (Arnett  DK, et al., 2019) failed to calculate for the following reasons:   The patient has a prior MI or stroke diagnosis    Assessment & Plan:   Problem List Items Addressed This Visit       Cardiovascular and Mediastinum   Chronic systolic heart failure ([place]) - Primary    Doing okay on Entresto encouraged her to continue and keep follow-up with cardiology later this week. Monitor for low BP.       Relevant Medications   sacubitril-valsartan (ENTRESTO) 24-26 MG   amiodarone (PACERONE) 200 MG tablet   carvedilol (COREG) 6.25 MG tablet   Benign essential hypertension    Pressure looks good today at home blood pressures look good as well.      Relevant Medications   sacubitril-valsartan (ENTRESTO) 24-26 MG   amiodarone (PACERONE) 200 MG tablet   carvedilol (COREG) 6.25 MG tablet    No follow-ups on file.    Beatrice Lecher, MD

## 2022-09-07 NOTE — Patient Instructions (Signed)
Your baseline weight is 208 lbs If you weight 211 lbs or more than please take a fluid pill once a day until your weight is back down to 209 lbs.

## 2022-09-07 NOTE — Assessment & Plan Note (Addendum)
Doing okay on Entresto encouraged her to continue and keep follow-up with cardiology later this week. Monitor for low BP.

## 2022-09-07 NOTE — Assessment & Plan Note (Signed)
Pressure looks good today at home blood pressures look good as well.

## 2022-09-08 ENCOUNTER — Telehealth: Payer: 59

## 2022-09-08 ENCOUNTER — Encounter: Payer: Self-pay | Admitting: Physician Assistant

## 2022-09-08 NOTE — Progress Notes (Addendum)
Cardiology Office Note    Date:  09/09/2022   ID:  Tanya Hogan, DOB 09/27/1950, MRN [id number]  PCP:  Hali Marry, MD  Cardiologist:  Freada Bergeron, MD  Electrophysiologist:  None   Chief Complaint: f/u hospitalization for VT - EP follow-up requested  History of Present Illness:   Tanya Hogan is a 72 y.o. female with history of chronic HFrEF/NICM and CHB with LBBB s/p remote BiV PPM s/p MDT gen change 10/2021, normal cors 2021, moderate MR and mild-moderate TR with moderate pulm HTN by echo 08/2022, PVCs and NSVT on amiodarone with recent sustained VT, prolonged QT interval, remotely diagnosed nonalcoholic cirrhosis (liver OK by CT 01/2022), DM2, HTN with prior orthostatic hypotension, HLD, PVD (listed in [place] but patient denies), breast CA s/p mastectomy, fibromyalgia, IBS, morbid obesity with OSA, bilateral carpal tunnel, and numerous medication allergies/intolerances seen for follow-up.  Per review of the record, the patient has a longstanding history of chronic systolic heart failure anid NICM. She reports first device placed around 2004. Remote notes from 2013 reference that she had BiV-PPM due to this. Her last ischemic eval was by cath in 01/2020 with normal coronaries and EF 35-40% at that time. Was admitted in the ED at [facility] in 08/2021 for decompensated HF with BNP 4700. She was started on bumex BID. Repeat TTE showed EF 20%. She was started on spironolactone and Jardiance at that time. However, in follow-up she stopped spironolactone due to low energy (though not improved after stopping so eventually restarted), did not tolerate Jardiance due to patient-reported dehydration, and did not tolerate ARB due to Itching. Hypotension has been an issue as well. She also has a remote history of hyperkalemia prohibiting ACEI/ARB very remotely (though was recently started on Entresto as outlined below). She underwent gen change of her BiV-PPM by Dr. Lovena Le in 10/2021. She  has previously followed simultaneously with Dr. Minna Merritts in the past who had her on amiodarone for NSVT though last OV there was in 2021. Last echo here 12/2021 EF 20-25%, mild-moderate LV enlargement, G1DD, trivial MR.   She was recently admitted to [facility] 1/5-1/11/24 with arrhythmia (suspect primary dx VT), hypokalemia, elevated troponin to peak 171, prolonged QT interval, and CHF. She did not pass out. Her discharge summary gives the diagnosis of SVT but narrative and cardiology note referred to this as VT. Per discharge summary, "#Symptomatic VTach. Cardioverted to pacemaker rhythm with IV amiodarone. Patient has history of prior episodes in the past and had been maintained on low dose amiodarone. Cardiology recommending optimization w/ GDMT for acute SHF, prior TTE w/ 20% EF. cardiology recommended continuing her on higher dose of amiodarone and having her f/u with her EP physician at [facility], Dr. Cristopher Peru. Cardiology placed her on crestor (discontinued pravastatin) and also increased dose of coreg. Unfortunately, pt did not tolerate addition of farxiga or spironolactone secondary to symptomatic hypotension with dizziness. She is unsure if she will be able to tolerate Entresto. I have told her to hold entresto if blood pressure low. " It was later mentioned that she would continue pravastatin. Repeat echo 08/29/22 EF 25-30%, severe HK of LV, diastolic dysfunction, severe LAE, moderate MR, mild-moderate TR, moderate elevation of RVSP. She was discharged on higher dose of amiodarone and carvedilol as well as new Entresto with recommendation to f/u with EP. Elevated troponin was felt due to demand ischemia.  She is seen back for follow-up overall feeling better. She does state she had a flurry  of palpitations shortly after discharge and sent in a transmission but no further events after that episode. I do not have that information at this time. She continues to note her baseline BP running low. She brings in a log  of BPs that are in the 90s at times. No chest pain reported. She asks for clarification on pravastatin versus rosuvastatin - we discussed that her hospital team had intended for her to continue pravastatin so that is what we will continue. She also clarifies that her PCP told her to resume Bumex PRN only, taking '1mg'$  daily as needed if weight 211 or higher which she has been taking the last few days. Weight today 214, was 212 at last OV.    Labwork independently reviewed: 09/01/22 K 3.7, Cr 0.72, Mg 2.1, TSH wnl, Ft4 wnl, pBNP 2012, trop peak 171, LFTs ok, Hgb 13.7, plt wnl 06/2022 A1c 5.9 03/2021 LDL 74  Past History   Past Medical History:  Diagnosis Date   Alkaline phosphatase elevation    Asthma    Breast cancer ([place]) 2011   Carpal tunnel syndrome    bilateral   Chronic HFrEF (heart failure with reduced ejection fraction) (HCC)    Cirrhosis, non-alcoholic (HCC)    Closed fracture of unspecified part of radius (alone)    DDD (degenerative disc disease), lumbar    Depression    pt denies   Diabetes mellitus without complication ([place])    Enlarged heart    Fibromyalgia    GERD (gastroesophageal reflux disease)    History of kidney stones    HTN (hypertension)    Hypothyroidism    IBS (irritable bowel syndrome)    LBBB (left bundle branch block)    Microcytic anemia 05/14/2017   Mitral regurgitation    NICM (nonischemic cardiomyopathy) ([place])    Pneumonia    Presence of permanent cardiac pacemaker    Pulmonary hypertension (HCC)    PVD (peripheral vascular disease) ([place])    2019   Sleep apnea    uses cpap   Tricuspid regurgitation    Ventricular tachyarrhythmia [facility])     Past Surgical History:  Procedure Laterality Date   BIV PACEMAKER GENERATOR CHANGEOUT N/A 11/03/2021   Procedure: BIV PACEMAKER GENERATOR CHANGEOUT;  Surgeon: Evans Lance, MD;  Location: [place] CV LAB;  Service: Cardiovascular;  Laterality: N/A;   BREAST LUMPECTOMY Left 2011   COLONOSCOPY     FOOT  SURGERY     KNEE SURGERY     LEAD REVISION/REPAIR N/A 11/03/2021   Procedure: LEAD REVISION/REPAIR;  Surgeon: Evans Lance, MD;  Location: [place] CV LAB;  Service: Cardiovascular;  Laterality: N/A;   MASTECTOMY Right    05/2019   PACEMAKER GENERATOR CHANGE N/A 08/23/2014   Procedure: PACEMAKER GENERATOR CHANGE;  Surgeon: Evans Lance, MD;  Location: [facility] C[facility] LAB;  Service: Cardiovascular;  Laterality: N/A;   PACEMAKER PLACEMENT  2009   [facility]   RECTAL SURGERY  1976   REDUCTION MAMMAPLASTY Left    05/2019   TOTAL SHOULDER ARTHROPLASTY Right 03/10/2018   TOTAL SHOULDER ARTHROPLASTY Right 03/10/2018   Procedure: RIGHT TOTAL SHOULDER ARTHROPLASTY;  Surgeon: [name], MD;  Location: [place];  Service: Orthopedics;  Laterality: Right;   TUBAL LIGATION      Current Medications: Current Meds  Medication Sig   acetaminophen (TYLENOL) 500 MG tablet Take 500-1,000 mg by mouth every 6 (six) hours as needed (for pain.).   albuterol (PROVENTIL) (2.5 MG/3ML) 0.083% nebulizer solution Take  2.5 mg by nebulization every 6 (six) hours as needed for wheezing or shortness of breath.   albuterol (VENTOLIN HFA) 108 (90 Base) MCG/ACT inhaler Inhale 1-2 puffs into the lungs every 6 (six) hours as needed for wheezing or shortness of breath.   alendronate (FOSAMAX) 70 MG tablet TAKE 1 TABLET BY MOUTH  EVERY 7 DAYS WITH A FULL  GLASS OF WATER ON AN EMPTY  STOMACH   AMBULATORY NON FORMULARY MEDICATION Medication Name: Tubing and supplies for nebulizer. Fax - [phone]   amiodarone (PACERONE) 200 MG tablet Take 200 mg by mouth 2 (two) times daily with a meal.   bumetanide (BUMEX) 1 MG tablet Take 1 tablet (1 mg total) by mouth daily as needed (weight gain or fluid retention).   carvedilol (COREG) 6.25 MG tablet Take 6.25 mg by mouth in the morning and at bedtime.   clotrimazole-betamethasone (LOTRISONE) cream 1 Application 2 (two) times daily.   Continuous Blood Gluc Receiver  (FREESTYLE LIBRE 2 READER) DEVI by Does not apply route.   Continuous Blood Gluc Sensor (FREESTYLE LIBRE 2 SENSOR) MISC 1 Device by Does not apply route every 14 (fourteen) days.   fexofenadine (ALLEGRA) 180 MG tablet Take 180 mg by mouth in the morning.   fluticasone-salmeterol (ADVAIR HFA) 115-21 MCG/ACT inhaler Inhale 2 puffs into the lungs 2 (two) times daily.   glucose blood test strip To be used to check blood glucose Dx:E11.8 FreeStyle Precision blood glucose for Freestyle Libre 2   levothyroxine (SYNTHROID) 88 MCG tablet TAKE 1 TABLET(88 MCG) BY MOUTH DAILY   LINZESS 72 MCG capsule Take 72-144 mcg by mouth 4 (four) times daily as needed (constipation.).   MAGNESIUM PO Take 1 tablet by mouth daily.   Multiple Vitamins-Minerals (OCUVITE EYE + MULTI) TABS Take 1 tablet by mouth in the morning.   Omega-3 Fatty Acids (FISH OIL) 1000 MG CAPS Take by mouth.   pantoprazole (PROTONIX) 40 MG tablet Take 1 tablet (40 mg total) by mouth daily before breakfast.   pravastatin (PRAVACHOL) 40 MG tablet Take 1 tablet (40 mg total) by mouth every evening.   sacubitril-valsartan (ENTRESTO) 24-26 MG Take 1 tablet by mouth 2 (two) times daily.   Semaglutide,0.25 or 0.'5MG'$ /DOS, (OZEMPIC, 0.25 OR 0.5 MG/DOSE,) 2 MG/1.5ML SOPN Inject 0.5 mg into the skin every Saturday.   SODIUM FLUORIDE 5000 PLUS 1.1 % CREA dental cream as directed.   Tiotropium Bromide Monohydrate (SPIRIVA RESPIMAT) 1.25 MCG/ACT AERS Inhale 2 puffs into the lungs daily.   valACYclovir (VALTREX) 1000 MG tablet Take 1 tablet (1,000 mg total) by mouth 2 (two) times daily. (Patient taking differently: Take 1,000 mg by mouth as needed (fever blisters/cold sores.).)   [DISCONTINUED] rosuvastatin (CRESTOR) 40 MG tablet Take 40 mg by mouth at bedtime.     Allergies:   Injectafer [ferric carboxymaltose], Penicillins, Accupril [quinapril hcl], Aleve [naproxen], Celebrex [celecoxib], Cozaar [losartan potassium], Flonase [fluticasone], Levomenol, Lipitor  [atorvastatin], Nasonex [mometasone furoate], Xyzal [levocetirizine], Chamomile, Vesicare [solifenacin], Aspirin, Baking soda-fluoride [sodium fluoride], Clindamycin/lincomycin, Codeine, Furosemide, Lactose intolerance (gi), Lanolin-petrolatum, Metformin and related, Miralax [polyethylene glycol], Quinine, Reclast [zoledronic acid], Rifampin, Sulfa antibiotics, Sulfonamide derivatives, Tramadol, Wellbutrin [bupropion], and Wool alcohol [lanolin]   Social History   Socioeconomic History   Marital status: Divorced    Spouse name: Not on file   Number of children: 2   Years of education: 14   Highest education level: Associate degree: academic program  Occupational History   Occupation: quality insurcance in [employer]    Comment: retired  Tobacco Use   Smoking status: Former    Packs/day: 0.00    Years: 0.00    Total pack years: 0.00    Types: Cigarettes    Quit date: 08/24/1966    Years since quitting: 56.0   Smokeless tobacco: Never  Vaping Use   Vaping Use: Never used  Substance and Sexual Activity   Alcohol use: No   Drug use: No   Sexual activity: Not Currently  Other Topics Concern   Not on file  Social History Narrative   Retired. Lives with and helps her brother. Likes Systems developer, Social worker and music. Loves animals.   Social Determinants of Health   Financial Resource Strain: Low Risk  (08/06/2022)   Overall Financial Resource Strain (CARDIA)    Difficulty of Paying Living Expenses: Not hard at all  Food Insecurity: No Food Insecurity (08/06/2022)   Hunger Vital Sign    Worried About Running Out of Food in the Last Year: Never true    Ran Out of Food in the Last Year: Never true  Transportation Needs: No Transportation Needs (08/06/2022)   PRAPARE - Hydrologist (Medical): No    Lack of Transportation (Non-Medical): No  Physical Activity: Sufficiently Active (08/06/2022)   Exercise Vital Sign    Days of Exercise per Week:  5 days    Minutes of Exercise per Session: 60 min  Stress: No Stress Concern Present (08/06/2022)   [place]    Feeling of Stress : Not at all  Social Connections: Moderately Integrated (08/06/2022)   Social Connection and Isolation Panel [NHANES]    Frequency of Communication with Friends and Family: More than three times a week    Frequency of Social Gatherings with Friends and Family: Three times a week    Attends Religious Services: More than 4 times per year    Active Member of Clubs or Organizations: Yes    Attends Music therapist: More than 4 times per year    Marital Status: Divorced     Family History:  The patient's family history includes Breast cancer in an other family member; Heart disease in her brother, father, and mother; Heart failure in an other family member; Hypertension in an other family member; Melanoma in her mother; Parkinson's disease in her mother.  ROS:   Please see the history of present illness. All other systems are reviewed and otherwise negative.    EKG(s)/Additional Testing   EKG:  EKG is ordered today, personally reviewed, demonstrating AV dual paced rhythm 61bpm, QTc 459m in setting of QRS duration 1659m CV Studies: Cardiac studies reviewed are outlined and summarized above. Otherwise please see EMR for full report.  Recent Labs: 07/07/2022: TSH 2.78 07/13/2022: ALT 31 08/10/2022: Brain Natriuretic Peptide 193; Hemoglobin 14.5; Platelets 276 08/25/2022: BUN 13; Creat 0.88; Potassium 3.9; Sodium 142  Recent Lipid Panel    Component Value Date/Time   CHOL 168 04/16/2021 0000   TRIG 120 04/16/2021 0000   HDL 73 04/16/2021 0000   CHOLHDL 2.3 04/16/2021 0000   VLDL 26 02/18/2017 0939   LDLCALC 74 04/16/2021 0000    PHYSICAL EXAM:    VS:  BP 120/72   Pulse 61   Ht '5\' 1"'$  (1.549 m)   Wt 214 lb 12.8 oz (97.4 kg)   SpO2 97%   BMI 40.59 kg/m   BMI: Body mass  index is 40.59 kg/m.  GEN: Well nourished,  well developed female in no acute distress HEENT: normocephalic, atraumatic Neck: no JVD, carotid bruits, or masses Cardiac: RRR; no murmurs, rubs, or gallops, trace ankle edema  Respiratory:  clear to auscultation bilaterally, normal work of breathing GI: soft, nontender, nondistended, + BS MS: no deformity or atrophy Skin: warm and dry, no rash Neuro:  Alert and Oriented x 3, Strength and sensation are intact, follows commands Psych: euthymic mood, full affect  Wt Readings from Last 3 Encounters:  09/09/22 214 lb 12.8 oz (97.4 kg)  09/07/22 216 lb (98 kg)  08/25/22 204 lb (92.5 kg)     ASSESSMENT & PLAN:   1. Ventricular tachycarda, prolonged QT interval, elevated troponin - recommended to f/u with EP post-discharge, here today for general cardiology follow-up. The patient was on amiodarone '100mg'$  PTA, now on '200mg'$  BID. She states she was advised to take '200mg'$  BID x 1 month then decrease to '200mg'$  daily. QTC stable. She did not lose consciousness with her arrhythmia. I asked device clinic to download the last manual transmission where patient had symptoms after discharge and their prelim review was no recurrent events. I have reached out to Dr. Lovena Le to review and advise on dosing as well as any additional steps for assessment. With her NICM, LBBB, bilateral carpal tunnel and arrhythmia, I question whether infiltrative disease is a consideration though CMR may be limited by device. Will see what Dr. Lovena Le thinks. We will request EP f/u ASAP to help guide further amiodarone dosing. Further monitoring of organ systems while on amiodarone will be at discretion of EP team. ADDENDUM: per review with Dr. Lovena Le, he agrees with continuing amiodarone '200mg'$  BID until they see her back in follow-up to guide next steps.  2. Elevated troponin - felt demand ischemia in the hospital, not felt c/w ACS. Given normal coronaries just 2 years ago this seems the most  likely case.  3. Chronic HFrEF/NICM - mild volume overload on exam with trace edema with mild weight gain though not markedly overloaded. Agree with PCP recommendation for Bumex '1mg'$  daily PRN when weight is 211 or higher at home. We will add back to medicine list. Get BMET/Mg today. Continue Entresto and carvedilol at current doses - will avoid additional titration given home reported BPs in the 90s at times. Not on SGLTi and spironolactone for reasons above. Given her complex history as well as intolerances of medications and tendency for hypotension, would like the input of the advanced heart failure team - will refer - patient agreeable.  4. CHB, LBBB s/p BiV PPM - will arrange EP f/u as requested.   5. Moderate MR and mild-moderate TR with moderate pulm HTN by echo 08/2022 - anticipate following clinically, consider recheck echo in 1 year if not otherwise repeated in the interim with the heart failure team.    Disposition: F/u with EP ASAP for VT f/u, as well as referral to advanced HF clinic. Based on labs will also advise when to f/u gen cards.   Medication Adjustments/Labs and Tests Ordered: Current medicines are reviewed at length with the patient today.  Concerns regarding medicines are outlined above. Medication changes, Labs and Tests ordered today are summarized above and listed in the Patient Instructions accessible in Encounters.   Signed, Charlie Pitter, PA-C  09/09/2022 11:15 AM    [place] Phone: [phone]; Fax: [phone]

## 2022-09-09 ENCOUNTER — Telehealth: Payer: Self-pay | Admitting: Cardiology

## 2022-09-09 ENCOUNTER — Encounter: Payer: 59 | Attending: Cardiology | Admitting: Physician Assistant

## 2022-09-09 ENCOUNTER — Encounter: Payer: Self-pay | Admitting: Physician Assistant

## 2022-09-09 VITALS — BP 120/72 | HR 61 | Ht 61.0 in | Wt 214.8 lb

## 2022-09-09 DIAGNOSIS — I361 Nonrheumatic tricuspid (valve) insufficiency: Secondary | ICD-10-CM | POA: Diagnosis not present

## 2022-09-09 DIAGNOSIS — Z950 Presence of cardiac pacemaker: Secondary | ICD-10-CM | POA: Diagnosis not present

## 2022-09-09 DIAGNOSIS — I472 Ventricular tachycardia, unspecified: Secondary | ICD-10-CM

## 2022-09-09 DIAGNOSIS — I272 Pulmonary hypertension, unspecified: Secondary | ICD-10-CM | POA: Diagnosis not present

## 2022-09-09 DIAGNOSIS — E875 Hyperkalemia: Secondary | ICD-10-CM | POA: Insufficient documentation

## 2022-09-09 DIAGNOSIS — I340 Nonrheumatic mitral (valve) insufficiency: Secondary | ICD-10-CM | POA: Diagnosis not present

## 2022-09-09 DIAGNOSIS — I5022 Chronic systolic (congestive) heart failure: Secondary | ICD-10-CM

## 2022-09-09 DIAGNOSIS — R9431 Abnormal electrocardiogram [ECG] [EKG]: Secondary | ICD-10-CM | POA: Diagnosis not present

## 2022-09-09 LAB — BASIC METABOLIC PANEL
BUN/Creatinine Ratio: 15 (ref 12–28)
BUN: 13 mg/dL (ref 8–27)
CO2: 22 mmol/L (ref 20–29)
Calcium: 10 mg/dL (ref 8.7–10.3)
Chloride: 106 mmol/L (ref 96–106)
Creatinine, Ser: 0.86 mg/dL (ref 0.57–1.00)
Glucose: 122 mg/dL — ABNORMAL HIGH (ref 70–99)
Potassium: 6 mmol/L (ref 3.5–5.2)
Sodium: 139 mmol/L (ref 134–144)
eGFR: 71 mL/min/{1.73_m2} (ref >59)

## 2022-09-09 LAB — MAGNESIUM: Magnesium: 2.2 mg/dL (ref 1.6–2.3)

## 2022-09-09 MED ORDER — BUMETANIDE 1 MG PO TABS: 1.0000 mg | ORAL_TABLET | Freq: Every day | ORAL | 1 refills | Status: DC | PRN

## 2022-09-09 MED ORDER — PRAVASTATIN SODIUM 40 MG PO TABS: 40.0000 mg | ORAL_TABLET | Freq: Every evening | ORAL | 3 refills | Status: DC

## 2022-09-09 NOTE — Addendum Note (Signed)
Addended by: Whitman Hero on: 09/09/2022 12:22 PM   Modules accepted: Orders

## 2022-09-09 NOTE — Patient Instructions (Addendum)
Medication Instructions:  We've updated your medicine list to reflect that you will be continuing on pravastatin at previous dose of '40mg'$  every evening rather than switching to rosuvastatin.   We also updated your medicine list to reflect that you will continue bumetanide '1mg'$  daily as needed for weight gain or fluid retention. *If you need a refill on your cardiac medications before your next appointment, please call your pharmacy*   Lab Work: TODAY-BMET & MAG If you have labs (blood work) drawn today and your tests are completely normal, you will receive your results only by: [place] (if you have MyChart) OR A paper copy in the mail If you have any lab test that is abnormal or we need to change your treatment, we will call you to review the results.   Testing/Procedures: NONE ORDERED   Follow-Up: At [facility], you and your health needs are our priority.  As part of our continuing mission to provide you with exceptional heart care, we have created designated Provider Care Teams.  These Care Teams include your primary Cardiologist (physician) and Advanced Practice Providers (APPs -  Physician Assistants and Nurse Practitioners) who all work together to provide you with the care you need, when you need it.  We recommend signing up for the patient portal called "MyChart".  Sign up information is provided on this After Visit Summary.  MyChart is used to connect with patients for Virtual Visits (Telemedicine).  Patients are able to view lab/test results, encounter notes, upcoming appointments, etc.  Non-urgent messages can be sent to your provider as well.   To learn more about what you can do with MyChart, go to [email or web address].    Your next appointment:   FIRST AVAILABLE  Provider:   You may see Cristopher Peru, MD or one of the following Advanced Practice Providers on your designated Care Team:   Tommye Standard, [place] Legrand Como "Jonni Sanger" Stella, [place] Mamie Levers, NP    Other Instructions  You have been referred to [facility].

## 2022-09-09 NOTE — Telephone Encounter (Signed)
[facility] calling with critical labs

## 2022-09-09 NOTE — Telephone Encounter (Signed)
Dunn, [name] N, PA-C  P Cv Div [facility] St Triage Labs discussed with Dr. Johney Frame K at discharge was 3.7, now 6.0 though newly on Entresto. EKG today showed stable QTC. Mg OK. Per d/w MD, HOLD further Entresto and recheck BMET ASAP - Ok to be in AM but would run stat. Any new symptoms -> ER. Thank you! Please also arrange gen cards f/u in ~3 weeks.  The patient has been notified of the result and verbalized understanding.  All questions (if any) were answered. Patient will come in tomorrow for lab work. Michaelyn Barter, RN 09/09/2022 4:54 PM

## 2022-09-10 ENCOUNTER — Telehealth: Payer: Self-pay

## 2022-09-10 ENCOUNTER — Encounter: Payer: Self-pay | Admitting: Physician Assistant

## 2022-09-10 DIAGNOSIS — E875 Hyperkalemia: Secondary | ICD-10-CM

## 2022-09-10 LAB — BASIC METABOLIC PANEL
BUN/Creatinine Ratio: 19 (ref 12–28)
BUN: 16 mg/dL (ref 8–27)
CO2: 22 mmol/L (ref 20–29)
Calcium: 9.7 mg/dL (ref 8.7–10.3)
Chloride: 107 mmol/L — ABNORMAL HIGH (ref 96–106)
Creatinine, Ser: 0.83 mg/dL (ref 0.57–1.00)
Glucose: 89 mg/dL (ref 70–99)
Potassium: 5.6 mmol/L — ABNORMAL HIGH (ref 3.5–5.2)
Sodium: 136 mmol/L (ref 134–144)
eGFR: 74 mL/min/{1.73_m2} (ref >59)

## 2022-09-10 MED ORDER — LOKELMA 10 G PO PACK: 10.0000 g | PACK | Freq: Once | ORAL | 0 refills | Status: AC

## 2022-09-10 NOTE — Telephone Encounter (Signed)
The patient has been notified of the result and verbalized understanding.  All questions (if any) were answered. Bernestine Amass, RN 09/10/2022 1:36 PM  Rx has been sent in. Repeat labs have been ordered.  Patient reports no new symptoms, she is aware that if anything new develops she should go to the ER.

## 2022-09-10 NOTE — Telephone Encounter (Signed)
-----  Message from Charlie Pitter, [place] sent at 09/10/2022  1:17 PM EST ----- Labs still show persistent hyperkalemia though improving. Discussed with Dr. Johney Frame. As long as she is still feeling OK without interim new symptoms from yesterday, would have her discontinue Entresto indefinitely and needs to pick up a dose of Lokelma 10 g x 1 dose today ASAP. I confirmed that the [facility] in [facility] on [address] have this in stock - please send in rx. Would limit sources of excess potassium in diet for now- bananas, squash, yogurt, white beans, sweet potatoes. Recheck stat BMET tomorrow to trend. However, if any new symptoms -> ER. Will send to triage team lead nurse in chat to expedite this. Thank you!

## 2022-09-11 ENCOUNTER — Telehealth: Payer: Self-pay | Admitting: Cardiology

## 2022-09-11 ENCOUNTER — Other Ambulatory Visit: Payer: Self-pay

## 2022-09-11 ENCOUNTER — Encounter (HOSPITAL_BASED_OUTPATIENT_CLINIC_OR_DEPARTMENT_OTHER): Payer: Self-pay | Admitting: Emergency Medicine

## 2022-09-11 ENCOUNTER — Emergency Department (HOSPITAL_BASED_OUTPATIENT_CLINIC_OR_DEPARTMENT_OTHER)
Admission: EM | Admit: 2022-09-11 | Discharge: 2022-09-11 | Disposition: A | Discharge status: Home / Self Care | Payer: 59 | Attending: Emergency Medicine | Admitting: Emergency Medicine

## 2022-09-11 DIAGNOSIS — Z853 Personal history of malignant neoplasm of breast: Secondary | ICD-10-CM | POA: Insufficient documentation

## 2022-09-11 DIAGNOSIS — J4540 Moderate persistent asthma, uncomplicated: Secondary | ICD-10-CM | POA: Insufficient documentation

## 2022-09-11 DIAGNOSIS — I5022 Chronic systolic (congestive) heart failure: Secondary | ICD-10-CM | POA: Insufficient documentation

## 2022-09-11 DIAGNOSIS — Z950 Presence of cardiac pacemaker: Secondary | ICD-10-CM | POA: Insufficient documentation

## 2022-09-11 DIAGNOSIS — R7989 Other specified abnormal findings of blood chemistry: Secondary | ICD-10-CM | POA: Diagnosis not present

## 2022-09-11 DIAGNOSIS — J45909 Unspecified asthma, uncomplicated: Secondary | ICD-10-CM | POA: Diagnosis not present

## 2022-09-11 DIAGNOSIS — E119 Type 2 diabetes mellitus without complications: Secondary | ICD-10-CM | POA: Insufficient documentation

## 2022-09-11 DIAGNOSIS — E875 Hyperkalemia: Secondary | ICD-10-CM | POA: Diagnosis not present

## 2022-09-11 DIAGNOSIS — Z96611 Presence of right artificial shoulder joint: Secondary | ICD-10-CM | POA: Insufficient documentation

## 2022-09-11 DIAGNOSIS — Z79899 Other long term (current) drug therapy: Secondary | ICD-10-CM | POA: Insufficient documentation

## 2022-09-11 DIAGNOSIS — I110 Hypertensive heart disease with heart failure: Secondary | ICD-10-CM | POA: Diagnosis not present

## 2022-09-11 DIAGNOSIS — E039 Hypothyroidism, unspecified: Secondary | ICD-10-CM | POA: Diagnosis not present

## 2022-09-11 DIAGNOSIS — Z0000 Encounter for general adult medical examination without abnormal findings: Secondary | ICD-10-CM

## 2022-09-11 DIAGNOSIS — Z87891 Personal history of nicotine dependence: Secondary | ICD-10-CM | POA: Insufficient documentation

## 2022-09-11 DIAGNOSIS — R0789 Other chest pain: Secondary | ICD-10-CM | POA: Diagnosis not present

## 2022-09-11 LAB — CBC WITH DIFFERENTIAL/PLATELET
Abs Immature Granulocytes: 0.03 10*3/uL (ref 0.00–0.07)
Basophils Absolute: 0.1 10*3/uL (ref 0.0–0.1)
Basophils Relative: 1 %
Eosinophils Absolute: 0.1 10*3/uL (ref 0.0–0.5)
Eosinophils Relative: 1 %
HCT: 43.1 % (ref 36.0–46.0)
Hemoglobin: 13.8 g/dL (ref 12.0–15.0)
Immature Granulocytes: 0 %
Lymphocytes Relative: 26 %
Lymphs Abs: 2.5 10*3/uL (ref 0.7–4.0)
MCH: 31.2 pg (ref 26.0–34.0)
MCHC: 32 g/dL (ref 30.0–36.0)
MCV: 97.3 fL (ref 80.0–100.0)
Monocytes Absolute: 0.7 10*3/uL (ref 0.1–1.0)
Monocytes Relative: 7 %
Neutro Abs: 6.4 10*3/uL (ref 1.7–7.7)
Neutrophils Relative %: 65 %
Platelets: 279 10*3/uL (ref 150–400)
RBC: 4.43 MIL/uL (ref 3.87–5.11)
RDW: 12.8 % (ref 11.5–15.5)
WBC: 9.9 10*3/uL (ref 4.0–10.5)
nRBC: 0 % (ref 0.0–0.2)

## 2022-09-11 LAB — COMPREHENSIVE METABOLIC PANEL
ALT: 22 U/L (ref 0–44)
AST: 26 U/L (ref 15–41)
Albumin: 3.9 g/dL (ref 3.5–5.0)
Alkaline Phosphatase: 97 U/L (ref 38–126)
Anion gap: 10 (ref 5–15)
BUN: 21 mg/dL (ref 8–23)
CO2: 20 mmol/L — ABNORMAL LOW (ref 22–32)
Calcium: 9.4 mg/dL (ref 8.9–10.3)
Chloride: 106 mmol/L (ref 98–111)
Creatinine, Ser: 1.01 mg/dL — ABNORMAL HIGH (ref 0.44–1.00)
GFR, Estimated: 59 mL/min — ABNORMAL LOW (ref >60)
Glucose, Bld: 106 mg/dL — ABNORMAL HIGH (ref 70–99)
Potassium: 4.5 mmol/L (ref 3.5–5.1)
Sodium: 136 mmol/L (ref 135–145)
Total Bilirubin: 0.5 mg/dL (ref 0.3–1.2)
Total Protein: 7 g/dL (ref 6.5–8.1)

## 2022-09-11 LAB — BASIC METABOLIC PANEL
BUN/Creatinine Ratio: 19 (ref 12–28)
BUN: 18 mg/dL (ref 8–27)
CO2: 21 mmol/L (ref 20–29)
Calcium: 9.9 mg/dL (ref 8.7–10.3)
Chloride: 108 mmol/L — ABNORMAL HIGH (ref 96–106)
Creatinine, Ser: 0.96 mg/dL (ref 0.57–1.00)
Glucose: 88 mg/dL (ref 70–99)
Potassium: 5.4 mmol/L — ABNORMAL HIGH (ref 3.5–5.2)
Sodium: 143 mmol/L (ref 134–144)
eGFR: 62 mL/min/{1.73_m2} (ref >59)

## 2022-09-11 LAB — MAGNESIUM: Magnesium: 2 mg/dL (ref 1.7–2.4)

## 2022-09-11 NOTE — Telephone Encounter (Signed)
Spoke with patient to advise Melina Copa, PA would like for her to go to the ED for potassium recheck. Patient agreed to go this afternoon.   Also she was advised to stop Entresto and she can take bumetanide as prescribed to help lower her potassium.  Patient verbalized understanding and had no additional questions.

## 2022-09-11 NOTE — Telephone Encounter (Signed)
Pt is returning call in regards to results from today's labs. Would like to talk to someone before the weekend begins.

## 2022-09-11 NOTE — Telephone Encounter (Signed)
Patient is following up. She states she went to the ED for potassium recheck as advised, but they closed at 4:00 PM.

## 2022-09-11 NOTE — ED Provider Notes (Signed)
[facility] Provider Note  CSN: [id number] Arrival date & time: 09/11/22 1704  Chief Complaint(s) Abnormal labs  HPI Tanya Hogan is a 72 y.o. female with past medical history as below, significant for anxiety, hld, chf, nash, osa, ibs, chb who presents to the ED with complaint of lab check. Pt was recently admitted , discharged following SVT. Found to have had elevated potassium, she had labs drawn yesterday which showed improving potassium but she reports to ED today with concern for elevated K. She has ongoing fatigue over the past month that is improving since she was recently hospitalized. She tried to go to lab office for potassium check but they were closed so was instructed to report to ED for potassium check by cardiology office.  She has no cp or dib, no n/v, no fevers or chills, compliant with home meds.   Past Medical History Past Medical History:  Diagnosis Date   Alkaline phosphatase elevation    Asthma    Breast cancer ([place]) 2011   Carpal tunnel syndrome    bilateral   Chronic HFrEF (heart failure with reduced ejection fraction) (HCC)    Cirrhosis, non-alcoholic (HCC)    Closed fracture of unspecified part of radius (alone)    DDD (degenerative disc disease), lumbar    Depression    pt denies   Diabetes mellitus without complication ([place])    Enlarged heart    Fibromyalgia    GERD (gastroesophageal reflux disease)    History of kidney stones    HTN (hypertension)    Hypothyroidism    IBS (irritable bowel syndrome)    LBBB (left bundle branch block)    Microcytic anemia 05/14/2017   Mitral regurgitation    NICM (nonischemic cardiomyopathy) ([place])    Pneumonia    Presence of permanent cardiac pacemaker    Pulmonary hypertension ([place])    PVD (peripheral vascular disease) ([place])    2019   Sleep apnea    uses cpap   Tricuspid regurgitation    Ventricular tachyarrhythmia [facility])    Patient Active Problem List    Diagnosis Date Noted   Stress and adjustment reaction 04/08/2022   Gastroesophageal reflux disease 02/16/2022   Complete heart block ([place]) 10/24/2021   Osteoporosis 02/28/2021   Primary osteoarthritis of left knee 01/14/2021   Irritable bowel syndrome with constipation 09/16/2020   Aortic atherosclerosis ([place]) 06/11/2020   Benign lipomatous neoplasm of skin and subcutaneous tissue of left leg 06/11/2020   Paroxysmal VT ([place]) 02/14/2020   Chronic constipation 10/06/2019   Moderate persistent asthma with exacerbation 08/29/2019   Genetic testing 08/14/2019   Ductal carcinoma in situ (DCIS) of right breast 06/15/2019   Arthralgia 04/11/2019   Malignant tumor of breast ([place]) 03/06/2019   Fatigue 03/06/2019   Status post total shoulder arthroplasty, right 03/10/2018   DDD (degenerative disc disease), cervical 11/19/2017   Peripheral edema 05/19/2017   Microcytic anemia 05/14/2017   Delayed sleep phase syndrome 10/29/2016   Primary osteoarthritis of left ankle 09/24/2016   Gastrointestinal food sensitivity 05/21/2016   History of arthroplasty of right shoulder 02/24/2016   Bilateral foot pain 01/22/2016   NASH (nonalcoholic steatohepatitis) 05/26/2015    Class: Stage 3   Nasal septal perforation 07/11/2014   Controlled diabetes mellitus type 2 with complications ([place]) 23/76/2831   Physical deconditioning 04/24/2014   Obesity, Class II, BMI 35-39.9, with comorbidity 06/09/2013   OSA (obstructive sleep apnea) 51/76/1607   Chronic systolic heart failure ([place]) 05/23/2013  Asthma, moderate persistent 04/18/2013   Chronic cough 04/18/2013   Breast cancer of upper-inner quadrant of left female breast ([place]) 03/18/2010   Hyperlipidemia 11/07/2009   Fibromyalgia 10/29/2009   Biventricular cardiac pacemaker in situ 10/23/2009   ALKALINE PHOSPHATASE, ELEVATED 10/13/2009   Hypothyroidism 10/03/2009   Benign essential hypertension 07/02/2009   Chronic back pain 07/02/2009   Home  Medication(s) Prior to Admission medications   Medication Sig Start Date End Date Taking? Authorizing Provider  acetaminophen (TYLENOL) 500 MG tablet Take 500-1,000 mg by mouth every 6 (six) hours as needed (for pain.).    [provider]  albuterol (PROVENTIL) (2.5 MG/3ML) 0.083% nebulizer solution Take 2.5 mg by nebulization every 6 (six) hours as needed for wheezing or shortness of breath.    [provider]  albuterol (VENTOLIN HFA) 108 (90 Base) MCG/ACT inhaler Inhale 1-2 puffs into the lungs every 6 (six) hours as needed for wheezing or shortness of breath. 05/27/21   Hali Marry, MD  alendronate (FOSAMAX) 70 MG tablet TAKE 1 TABLET BY MOUTH  EVERY 7 DAYS WITH A FULL  GLASS OF WATER ON AN EMPTY  STOMACH 06/18/22   Hali Marry, MD  AMBULATORY NON FORMULARY MEDICATION Medication Name: Tubing and supplies for nebulizer. Fax - [phone] 08/14/19   Hali Marry, MD  amiodarone (PACERONE) 200 MG tablet Take 200 mg by mouth 2 (two) times daily with a meal. 09/03/22   [provider]  bumetanide (BUMEX) 1 MG tablet Take 1 tablet (1 mg total) by mouth daily as needed (weight gain or fluid retention). 09/09/22   Dunn, Nedra Hai, PA-C  carvedilol (COREG) 6.25 MG tablet Take 6.25 mg by mouth in the morning and at bedtime. 09/03/22   [provider]  clotrimazole-betamethasone (LOTRISONE) cream 1 Application 2 (two) times daily. 09/03/22 09/03/23  [provider]  Continuous Blood Gluc Receiver (FREESTYLE LIBRE 2 READER) DEVI by Does not apply route.    [provider]  Continuous Blood Gluc Sensor (FREESTYLE LIBRE 2 SENSOR) MISC 1 Device by Does not apply route every 14 (fourteen) days. 07/10/22   Hali Marry, MD  fexofenadine (ALLEGRA) 180 MG tablet Take 180 mg by mouth in the morning.    [provider]  fluticasone-salmeterol (ADVAIR HFA) 115-21 MCG/ACT inhaler Inhale 2 puffs into the lungs 2 (two) times  daily.    [provider]  glucose blood test strip To be used to check blood glucose Dx:E11.8 FreeStyle Precision blood glucose for Freestyle Libre 2 08/25/22   Hali Marry, MD  levothyroxine (SYNTHROID) 88 MCG tablet TAKE 1 TABLET(88 MCG) BY MOUTH DAILY 03/23/22   Hali Marry, MD  LINZESS 72 MCG capsule Take 72-144 mcg by mouth 4 (four) times daily as needed (constipation.). 02/20/21   [provider]  MAGNESIUM PO Take 1 tablet by mouth daily.    [provider]  Multiple Vitamins-Minerals (OCUVITE EYE + MULTI) TABS Take 1 tablet by mouth in the morning.    [provider]  Omega-3 Fatty Acids (FISH OIL) 1000 MG CAPS Take by mouth.    [provider]  pantoprazole (PROTONIX) 40 MG tablet Take 1 tablet (40 mg total) by mouth daily before breakfast. 02/16/22   Hali Marry, MD  pravastatin (PRAVACHOL) 40 MG tablet Take 1 tablet (40 mg total) by mouth every evening. 09/09/22   Dunn, Nedra Hai, PA-C  Semaglutide,0.25 or 0.'5MG'$ /DOS, (OZEMPIC, 0.25 OR 0.5 MG/DOSE,) 2 MG/1.5ML SOPN Inject 0.5 mg into  the skin every Saturday.    [provider]  SODIUM FLUORIDE 5000 PLUS 1.1 % CREA dental cream as directed. 06/10/22   [provider]  Tiotropium Bromide Monohydrate (SPIRIVA RESPIMAT) 1.25 MCG/ACT AERS Inhale 2 puffs into the lungs daily.    [provider]  valACYclovir (VALTREX) 1000 MG tablet Take 1 tablet (1,000 mg total) by mouth 2 (two) times daily. Patient taking differently: Take 1,000 mg by mouth as needed (fever blisters/cold sores.). 08/05/21   Hali Marry, MD                                                                                                                                    Past Surgical History Past Surgical History:  Procedure Laterality Date   BIV PACEMAKER GENERATOR CHANGEOUT N/A 11/03/2021   Procedure: BIV PACEMAKER GENERATOR CHANGEOUT;  Surgeon: Evans Lance, MD;   Location: [facility];  Service: Cardiovascular;  Laterality: N/A;   BREAST LUMPECTOMY Left 2011   COLONOSCOPY     FOOT SURGERY     KNEE SURGERY     LEAD REVISION/REPAIR N/A 11/03/2021   Procedure: LEAD REVISION/REPAIR;  Surgeon: Evans Lance, MD;  Location: [facility];  Service: Cardiovascular;  Laterality: N/A;   MASTECTOMY Right    05/2019   PACEMAKER GENERATOR CHANGE N/A 08/23/2014   Procedure: PACEMAKER GENERATOR CHANGE;  Surgeon: Evans Lance, MD;  [facility];  Service: Cardiovascular;  Laterality: N/A;   PACEMAKER PLACEMENT  2009   [place] [facility]   RECTAL SURGERY  1976   REDUCTION MAMMAPLASTY Left    05/2019   TOTAL SHOULDER ARTHROPLASTY Right 03/10/2018   TOTAL SHOULDER ARTHROPLASTY Right 03/10/2018   Procedure: RIGHT TOTAL SHOULDER ARTHROPLASTY;  Surgeon: [name], MD;  Location: [facility];  Service: Orthopedics;  Laterality: Right;   TUBAL LIGATION     Family History Family History  Problem Relation Age of Onset   Heart disease Mother    Melanoma Mother    Parkinson's disease Mother    Heart disease Father    Heart disease Brother    Heart failure Other    Breast cancer Other    Hypertension Other     Social History Social History   Tobacco Use   Smoking status: Former    Packs/day: 0.00    Years: 0.00    Total pack years: 0.00    Types: Cigarettes    Quit date: 08/24/1966    Years since quitting: 56.0   Smokeless tobacco: Never  Vaping Use   Vaping Use: Never used  Substance Use Topics   Alcohol use: No   Drug use: No   Allergies Injectafer [ferric carboxymaltose], Penicillins, Accupril [quinapril hcl], Aleve [naproxen], Celebrex [celecoxib], Cozaar [losartan potassium], Flonase [fluticasone], Levomenol, Lipitor [atorvastatin], Nasonex [mometasone furoate], Xyzal [levocetirizine], Chamomile, Entresto [sacubitril-valsartan], Jardiance [empagliflozin], Vesicare [solifenacin], Aspirin, Baking soda-fluoride [sodium  fluoride], Clindamycin/lincomycin, Codeine, Furosemide,  Lactose intolerance (gi), Lanolin-petrolatum, Metformin and related, Miralax [polyethylene glycol], Quinine, Reclast [zoledronic acid], Rifampin, Sulfa antibiotics, Sulfonamide derivatives, Tramadol, Wellbutrin [bupropion], and Wool alcohol [lanolin]  Review of Systems Review of Systems  Constitutional:  Positive for fatigue. Negative for activity change and fever.  HENT:  Negative for facial swelling and trouble swallowing.   Eyes:  Negative for discharge and redness.  Respiratory:  Negative for cough and shortness of breath.   Cardiovascular:  Negative for chest pain and palpitations.  Gastrointestinal:  Negative for abdominal pain and nausea.  Genitourinary:  Negative for dysuria and flank pain.  Musculoskeletal:  Negative for back pain and gait problem.  Skin:  Negative for pallor and rash.  Neurological:  Negative for syncope and headaches.    Physical Exam Vital Signs  I have reviewed the triage vital signs BP 133/68 (BP Location: Right Arm)   Pulse 64   Temp 98.1 F (36.7 C) (Oral)   Resp 19   SpO2 97%  Physical Exam Vitals and nursing note reviewed.  Constitutional:      General: She is not in acute distress.    Appearance: Normal appearance.  HENT:     Head: Normocephalic and atraumatic.     Right Ear: External ear normal.     Left Ear: External ear normal.     Nose: Nose normal.     Mouth/Throat:     Mouth: Mucous membranes are moist.  Eyes:     General: No scleral icterus.       Right eye: No discharge.        Left eye: No discharge.  Cardiovascular:     Rate and Rhythm: Normal rate and regular rhythm.     Pulses: Normal pulses.     Heart sounds: Normal heart sounds.  Pulmonary:     Effort: Pulmonary effort is normal. No respiratory distress.     Breath sounds: Normal breath sounds.  Abdominal:     General: Abdomen is flat.     Tenderness: There is no abdominal tenderness.  Musculoskeletal:         General: Normal range of motion.     Cervical back: Normal range of motion.     Right lower leg: No edema.     Left lower leg: No edema.  Skin:    General: Skin is warm and dry.     Capillary Refill: Capillary refill takes less than 2 seconds.  Neurological:     Mental Status: She is alert and oriented to person, place, and time.     GCS: GCS eye subscore is 4. GCS verbal subscore is 5. GCS motor subscore is 6.  Psychiatric:        Mood and Affect: Mood normal.        Behavior: Behavior normal.     ED Results and Treatments Labs (all labs ordered are listed, but only abnormal results are displayed) Labs Reviewed  COMPREHENSIVE METABOLIC PANEL - Abnormal; Notable for the following components:      Result Value   CO2 20 (*)    Glucose, Bld 106 (*)    Creatinine, Ser 1.01 (*)    GFR, Estimated 59 (*)    All other components within normal limits  MAGNESIUM  CBC WITH DIFFERENTIAL/PLATELET  Radiology No results found.  Pertinent labs & imaging results that were available during my care of the patient were reviewed by me and considered in my medical decision making (see MDM for details).  Medications Ordered in ED Medications - No data to display                                                                                                                                   Procedures Procedures  (including critical care time)  Medical Decision Making / ED Course   MDM:  Tanya Hogan is a 72 y.o. female with past medical history as below, significant for anxiety, hld, chf, nash, osa, ibs, chb who presents to the ED with complaint of lab check. . The complaint involves an extensive differential diagnosis and also carries with it a high risk of complications and morbidity.  Serious etiology was considered. Ddx includes but is not limited to: metabolic  derangement, medication effect, dehydration, etc  On initial assessment the patient is: resting comfortably, NAD, HDS, ambient air Vital signs and nursing notes were reviewed    Pt here primarily for potassium level check, this was WNL today, was 5.6 on prior. Her entresto is managed by cardiology. I spoke with cardiology PA Roda Shutters, reports she will f/u with patient on Monday, also has f/u with her pcp on Monday as well.   She is feeling better, her potassium is stable. Discussed foods she can eat at home that will not cause her potassium to increase so much, extensive discussion at bedside in regards to patients recent hospitalization and further questions she had in regards to her ongoing care  The patient improved significantly and was discharged in stable condition. Detailed discussions were had with the patient regarding current findings, and need for close f/u with PCP or on call doctor. The patient has been instructed to return immediately if the symptoms worsen in any way for re-evaluation. Patient verbalized understanding and is in agreement with current care plan. All questions answered prior to discharge.     Additional history obtained: -Additional history obtained from friend -External records from outside source obtained and reviewed including: Chart review including previous notes, labs, imaging, consultation notes including prior admission documentation, prior labs/imagimg/home meds; spoke with PA at cardiology office who provided further hx    Lab Tests: -I ordered, reviewed, and interpreted labs.   The pertinent results include:   Labs Reviewed  COMPREHENSIVE METABOLIC PANEL - Abnormal; Notable for the following components:      Result Value   CO2 20 (*)    Glucose, Bld 106 (*)    Creatinine, Ser 1.01 (*)    GFR, Estimated 59 (*)    All other components within normal limits  MAGNESIUM  CBC WITH DIFFERENTIAL/PLATELET    Notable for labs stable  EKG   EKG  Interpretation  Date/Time:  Friday September 11 2022 19:17:29 EST Ventricular Rate:  61 PR Interval:  126 QRS Duration: 183 QT Interval:  463 QTC Calculation: 467 R Axis:   126 Text Interpretation: Sinus or ectopic atrial rhythm Consider left ventricular hypertrophy Repol abnrm, severe global ischemia (LM/MVD) similar to prior no stemi Confirmed by Wynona Dove ([id number]) on 09/11/2022 7:24:13 PM         Imaging Studies ordered: Na  Medicines ordered and prescription drug management: No orders of the defined types were placed in this encounter.   -I have reviewed the patients home medicines and have made adjustments as needed   Consultations Obtained: na   Cardiac Monitoring: The patient was maintained on a cardiac monitor.  I personally viewed and interpreted the cardiac monitored which showed an underlying rhythm of: NSR  Social Determinants of Health:  Diagnosis or treatment significantly limited by social determinants of health: former smoker and obesity   Reevaluation: After the interventions noted above, I reevaluated the patient and found that they have improved  Co morbidities that complicate the patient evaluation  Past Medical History:  Diagnosis Date   Alkaline phosphatase elevation    Asthma    Breast cancer ([place]) 2011   Carpal tunnel syndrome    bilateral   Chronic HFrEF (heart failure with reduced ejection fraction) (HCC)    Cirrhosis, non-alcoholic (HCC)    Closed fracture of unspecified part of radius (alone)    DDD (degenerative disc disease), lumbar    Depression    pt denies   Diabetes mellitus without complication ([place])    Enlarged heart    Fibromyalgia    GERD (gastroesophageal reflux disease)    History of kidney stones    HTN (hypertension)    Hypothyroidism    IBS (irritable bowel syndrome)    LBBB (left bundle branch block)    Microcytic anemia 05/14/2017   Mitral regurgitation    NICM (nonischemic cardiomyopathy) ([place])    Pneumonia     Presence of permanent cardiac pacemaker    Pulmonary hypertension (HCC)    PVD (peripheral vascular disease) ([place])    2019   Sleep apnea    uses cpap   Tricuspid regurgitation    Ventricular tachyarrhythmia ([place])       Dispostion: Disposition decision including need for hospitalization was considered, and patient discharged from emergency department.    Final Clinical Impression(s) / ED Diagnoses Final diagnoses:  Physical exam     This chart was dictated using voice recognition software.  Despite best efforts to proofread,  errors can occur which can change the documentation meaning.    Jeanell Sparrow, DO 09/12/22 [id number]

## 2022-09-11 NOTE — Telephone Encounter (Signed)
Return call to patient who states she went the lab office instead of the ED. She is going to the ED now to have follow labs.

## 2022-09-11 NOTE — ED Triage Notes (Signed)
Pt sent for repeat K level as it was 5.4 this morning.  Pt states recent change in meds due to an "afib episode" last week.

## 2022-09-11 NOTE — Telephone Encounter (Addendum)
I do not see where the 5.8 is coming from. Can you please reach out to the lab for details? It has not resulted on our end so not sure how this is crossing over unless she has some sort of [facility] access to which it has dropped. She is not on torsemide by our records, she is on bumetanide so please clarify. OK to take bumetanide as prescribed which may actually help lower K. But must remain OFF Entresto as we advised the other day. This is concerning to me - with K of 5.8 despite holding Entresto/giving Lokelma, unfortunately we will have to ask her to go to the emergency department to have this managed further as we cannot safely monitor over the weekend while the office is closed. Given her h/o ventricular arrhythmias, this really needs to be treated/tracked until it can be proven resolved.  Would also recommend arranging gen cards f/u with [place] in 2 weeks. Has EP f/u but needs gen cards to help follow her CHF therapy.

## 2022-09-11 NOTE — Telephone Encounter (Signed)
Spoke with patient who wanted to know results of lab work today. She states she received a notification and that her potassium is 5.8.  I advised I could not see the result yet and that it has not been reviewed by her provider.   Provided education on low potassium foods. Advised we would follow up with her once her labs have been reviewed.

## 2022-09-11 NOTE — Addendum Note (Signed)
Addended by: Charlie Pitter on: 09/11/2022 05:55 PM   Modules accepted: Orders

## 2022-09-11 NOTE — Discharge Instructions (Addendum)
It was a pleasure caring for you today in the emergency department.  Please return to the emergency department for any worsening or worrisome symptoms.  Please follow-up with your PCP and cardiology team on Monday  Would recommend limiting your intake of tree fruit such as avocados apples oranges and bananas and leafy greens.  Dried fruits.  Carrots, potatoes, sweet potatoes.  May still consume these fruits and vegetables but in moderation

## 2022-09-14 ENCOUNTER — Ambulatory Visit (INDEPENDENT_AMBULATORY_CARE_PROVIDER_SITE_OTHER): Payer: 59

## 2022-09-14 DIAGNOSIS — E1169 Type 2 diabetes mellitus with other specified complication: Secondary | ICD-10-CM

## 2022-09-14 DIAGNOSIS — E118 Type 2 diabetes mellitus with unspecified complications: Secondary | ICD-10-CM

## 2022-09-14 DIAGNOSIS — I10 Essential (primary) hypertension: Secondary | ICD-10-CM

## 2022-09-14 DIAGNOSIS — E785 Hyperlipidemia, unspecified: Secondary | ICD-10-CM

## 2022-09-14 NOTE — Telephone Encounter (Signed)
Spoke with the patient and reviewed recommendations from Melina Copa, [place]. Follow up has been scheduled.

## 2022-09-14 NOTE — Telephone Encounter (Signed)
Following up from ER visit - to recap, pt called our office early Fri PM to let us know that her labs had resulted on her phone with potassium of 5.8 (but not yet crossing over to Epic) so we recommended she go to ER for treatment. When she got there her labs had result of 5.4, with recheck of 4.5.  Recommendations: - remain OFF Entresto - limit high potassium foods in diet - In addition to EP f/u, please arrange gen cards f/u in 4 weeks so that we have some place to land from gen cards standpoint before she can get in with [facility] clinic  Thank you!

## 2022-09-14 NOTE — Progress Notes (Signed)
Chronic Care Management Pharmacy Note  09/14/2022 Name:  Tanya Hogan MRN:  [id number] DOB:  July 21, 1951  Summary: addressed HTN, HLD, and primarily DM. Since our last discussion, she was hospitalized for SVT. She also completed post-hospital follow up with PCP.   BG 120-130s.   Today she is tearful and struggled to thoroughly review medications. She is feeling very exhausted and overwhelmed by the medication changes and overall health condition.  She also states she received a letter from [employer] in the mail about her and brother's ozempic patient assistance, but cannot find the paper.  Recommendations/Changes made from today's visit:  - No medication changes, provided counseling on medication side effects, answered patient questions, and encouraged review of medication regimen at upcoming cardiology visit.  - Will inquire status update from [employer] regarding ozempic patient assistance, though it is important to note that company is ~2 months behind processing. Made patient aware of this.  Plan: f/u with pharmacist in 1 month  Subjective: Tanya Hogan is an 72 y.o. year old female who is a primary patient of [name], Rene Kocher, MD.  The CCM team was consulted for assistance with disease management and care coordination needs.    Engaged with patient by telephone for follow up visit in response to provider referral for pharmacy case management and/or care coordination services.   Consent to Services:  The patient was given information about Chronic Care Management services, agreed to services, and gave verbal consent prior to initiation of services.  Please see initial visit note for detailed documentation.   Patient Care Team: Hali Marry, MD as PCP - General (Family Medicine) Freada Bergeron, MD as PCP - Cardiology (Cardiology) Marcy Panning, MD (Hematology and Oncology) Lelon Perla, MD as Attending Physician (Cardiology) Evans Lance, MD  (Cardiology) Dr. Caprice Kluver (Otolaryngology) Amada Kingfisher, MD as Consulting Physician (Hematology and Oncology) Gerre Pebbles, MD as Referring Physician (Gastroenterology) Haynes Dage, OD as Referring Physician (Optometry) Darius Bump, [facility] as Pharmacist (Pharmacist) Vanita Ingles, RN as Case Manager (General Practice)   Objective:  Lab Results  Component Value Date   CREATININE 1.01 (H) 09/11/2022   CREATININE 0.96 09/11/2022   CREATININE 0.83 09/10/2022    Lab Results  Component Value Date   HGBA1C 5.9 (H) 07/07/2022   Last diabetic Eye exam:  Lab Results  Component Value Date/Time   HMDIABEYEEXA No Retinopathy 05/26/2022 12:00 AM    Last diabetic Foot exam: No results found for: "HMDIABFOOTEX"      Component Value Date/Time   CHOL 168 04/16/2021 0000   TRIG 120 04/16/2021 0000   HDL 73 04/16/2021 0000   CHOLHDL 2.3 04/16/2021 0000   VLDL 26 02/18/2017 0939   LDLCALC 74 04/16/2021 0000       Latest Ref Rng & Units 09/11/2022    5:53 PM 07/13/2022    7:54 AM 04/06/2022   11:00 AM  Hepatic Function  Total Protein 6.5 - 8.1 g/dL 7.0  7.1  7.1   Albumin 3.5 - 5.0 g/dL 3.9  3.7  4.7   AST 15 - 41 U/L 26  34  21   ALT 0 - 44 U/L '22  31  22   '$ Alk Phosphatase 38 - 126 U/L 97  132  125   Total Bilirubin 0.3 - 1.2 mg/dL 0.5  0.3  0.5     Lab Results  Component Value Date/Time   TSH 2.78 07/07/2022 12:00 AM   TSH 0.93  02/16/2022 01:43 PM       Latest Ref Rng & Units 09/11/2022    5:53 PM 08/10/2022   11:58 AM 07/13/2022    7:54 AM  CBC  WBC 4.0 - 10.5 K/uL 9.9  7.0  7.6   Hemoglobin 12.0 - 15.0 g/dL 13.8  14.5  14.0   Hematocrit 36.0 - 46.0 % 43.1  43.5  43.6   Platelets 150 - 400 K/uL 279  276  237     Lab Results  Component Value Date/Time   VD25OH 65 05/04/2014 02:27 PM   VD25OH 61 12/29/2010 08:05 AM    Clinical ASCVD: Yes  The ASCVD Risk score (Arnett DK, et al., 2019) failed to calculate for the following reasons:   The patient has a  prior MI or stroke diagnosis      Social History   Tobacco Use  Smoking Status Former   Packs/day: 0.00   Years: 0.00   Total pack years: 0.00   Types: Cigarettes   Quit date: 08/24/1966   Years since quitting: 56.0  Smokeless Tobacco Never   BP Readings from Last 3 Encounters:  09/11/22 133/68  09/09/22 120/72  09/07/22 114/75   Pulse Readings from Last 3 Encounters:  09/11/22 64  09/09/22 61  09/07/22 64   Wt Readings from Last 3 Encounters:  09/09/22 214 lb 12.8 oz (97.4 kg)  09/07/22 216 lb (98 kg)  08/25/22 204 lb (92.5 kg)    Assessment: Review of patient past medical history, allergies, medications, health status, including review of consultants reports, laboratory and other test data, was performed as part of comprehensive evaluation and provision of chronic care management services.   SDOH:  (Social Determinants of Health) assessments and interventions performed:  SDOH Interventions    Flowsheet Row Chronic Care Management from 07/03/2022 in [place] at [place] Visit from 02/25/2021 in [place] at [place] Interventions    Food Insecurity Interventions Intervention Not Indicated Intervention Not Indicated  Housing Interventions Intervention Not Indicated Intervention Not Indicated  Transportation Interventions Intervention Not Indicated Intervention Not Indicated  Utilities Interventions Intervention Not Indicated --  Alcohol Usage Interventions Intervention Not Indicated (Score <7) --  Financial Strain Interventions Intervention Not Indicated Intervention Not Indicated  Physical Activity Interventions Intervention Not Indicated  [goes to the [facility] and takes classes almost daily] Intervention Not Indicated  Stress Interventions Intervention Not Indicated Intervention Not Indicated  Social Connections Interventions Intervention Not Indicated, Other (Comment)   [has good support system and church friends] Intervention Not Indicated       CCM Care Plan  Allergies  Allergen Reactions   Injectafer [Ferric Carboxymaltose] Shortness Of Breath    Mild SOB following injectafer infusion Mild SOB following injectafer infusion    Penicillins Anaphylaxis and Rash    Has patient had a PCN reaction causing immediate rash, facial/tongue/throat swelling, SOB or lightheadedness with hypotension: Yes Has patient had a PCN reaction causing severe rash involving mucus membranes or skin necrosis: No Has patient had a PCN reaction that required hospitalization: No Has patient had a PCN reaction occurring within the last 10 years: No If all of the above answers are "NO", then may proceed with Cephalosporin use. "Stuttered with medication"     Accupril [Quinapril Hcl] Other (See Comments)    Hyperkalemia=high potassium level   Aleve [Naproxen] Swelling   Celebrex [Celecoxib]     ulcers   Cozaar Donnie Coffin  Potassium] Itching   Flonase [Fluticasone] Other (See Comments)    Nasal ulcer.     Levomenol Other (See Comments)    unknown unknown    Lipitor [Atorvastatin] Other (See Comments)    muscle aches/pains   Nasonex [Mometasone Furoate] Other (See Comments)    nosebleed   Xyzal [Levocetirizine] Other (See Comments)    Doesn't recall reaction type   Chamomile Other (See Comments)    unknown   Entresto [Sacubitril-Valsartan]     Hyperkalemia   Jardiance [Empagliflozin]     Dehydrated, kidney dysfunction per patient   Vesicare [Solifenacin] Other (See Comments)    "stopped me from urinating"   Aspirin Other (See Comments)    Pt states "burns holes in stomach", ulcers    Baking Soda-Fluoride [Sodium Fluoride] Itching and Rash   Clindamycin/Lincomycin Rash   Codeine Nausea And Vomiting   Furosemide Other (See Comments)    Nose bleed   Lactose Intolerance (Gi) Other (See Comments)    Increased calcium, sneezing, nasal issues   Lanolin-Petrolatum  Rash   Metformin And Related Diarrhea   Miralax [Polyethylene Glycol] Rash   Quinine Rash   Reclast [Zoledronic Acid] Rash and Other (See Comments)    shaking   Rifampin Other (See Comments)    DOES NOT REMEMBER THIS MEDICATION OR ALLERGY   Sulfa Antibiotics Rash   Sulfonamide Derivatives Rash   Tramadol Rash    Anxiety   Wellbutrin [Bupropion] Other (See Comments)    Muscle aches, pain, "made my skin crawl"   Wool Alcohol [Lanolin] Rash    Medications Reviewed Today     Reviewed by Felipa Evener (Physician Assistant Certified) on [birth year] at [place] List Status: <None>   Medication Order Taking? Sig Documenting Provider Last Dose Status Informant  acetaminophen (TYLENOL) 500 MG tablet [id number] Yes Take 500-1,000 mg by mouth every 6 (six) hours as needed (for pain.). [provider] Taking Active Self  albuterol (PROVENTIL) (2.5 MG/3ML) 0.083% nebulizer solution [id number] Yes Take 2.5 mg by nebulization every 6 (six) hours as needed for wheezing or shortness of breath. [provider] Taking Active Self  albuterol (VENTOLIN HFA) 108 (90 Base) MCG/ACT inhaler [id number] Yes Inhale 1-2 puffs into the lungs every 6 (six) hours as needed for wheezing or shortness of breath. Hali Marry, MD Taking Active Self  alendronate (FOSAMAX) 70 MG tablet [id number] Yes TAKE 1 TABLET BY MOUTH  EVERY 7 DAYS WITH A FULL  GLASS OF WATER ON AN EMPTY  STOMACH Hali Marry, MD Taking Active   AMBULATORY NON [place] [id number] Yes Medication Name: Tubing and supplies for nebulizer. Fax - [phone] Hali Marry, MD Taking Active Self  amiodarone (PACERONE) 200 MG tablet [id number] Yes Take 200 mg by mouth 2 (two) times daily with a meal. [provider] Taking Active   carvedilol (COREG) 6.25 MG tablet [id number] Yes Take 6.25 mg by mouth in the morning and at bedtime. [provider] Taking Active   clotrimazole-betamethasone  (LOTRISONE) cream [id number] Yes 1 Application 2 (two) times daily. [provider] Taking Active   Continuous Blood Gluc Receiver (FREESTYLE LIBRE 2 READER) DEVI [id number] Yes by Does not apply route. [provider] Taking Active Self  Continuous Blood Gluc Sensor (FREESTYLE LIBRE 2 SENSOR) MISC [id number] Yes 1 Device by Does not apply route every 14 (fourteen) days. Hali Marry, MD Taking Active   fexofenadine [facility]) 180 MG tablet [id number] Yes Take 180 mg by mouth in  the morning. [provider] Taking Active Self  fluticasone-salmeterol (ADVAIR HFA) 109-32 MCG/ACT inhaler [id number] Yes Inhale 2 puffs into the lungs 2 (two) times daily. [provider] Taking Active Self  glucose blood test strip [id number] Yes To be used to check blood glucose Dx:E11.8 FreeStyle Precision blood glucose for Freestyle Libre 2 Hali Marry, MD Taking Active   levothyroxine (SYNTHROID) 88 MCG tablet [id number] Yes TAKE 1 TABLET(88 MCG) BY MOUTH DAILY Hali Marry, MD Taking Active   LINZESS 72 MCG capsule [id number] Yes Take 72-144 mcg by mouth 4 (four) times daily as needed (constipation.). [provider] Taking Active Self           Med Note Louretta Shorten, APRIL   Mon Oct 13, 2021 11:02 AM)    MAGNESIUM PO [id number] Yes Take 1 tablet by mouth daily. [provider] Taking Active   Multiple Vitamins-Minerals (OCUVITE EYE + MULTI) TABS [id number] Yes Take 1 tablet by mouth in the morning. [provider] Taking Active Self  Omega-3 Fatty Acids (FISH OIL) 1000 MG CAPS [id number] Yes Take by mouth. [provider] Taking Active   pantoprazole (PROTONIX) 40 MG tablet [id number] Yes Take 1 tablet (40 mg total) by mouth daily before breakfast. Hali Marry, MD Taking Active   rosuvastatin (CRESTOR) 40 MG tablet [id number] Yes Take 40 mg by mouth at bedtime. [provider] Taking Active   sacubitril-valsartan  (ENTRESTO) 24-26 MG [id number] Yes Take 1 tablet by mouth 2 (two) times daily. [provider] Taking Active   Semaglutide,0.25 or 0.'5MG'$ /DOS, (OZEMPIC, 0.25 OR 0.5 MG/DOSE,) 2 MG/1.5ML SOPN [id number] Yes Inject 0.5 mg into the skin every Saturday. [provider] Taking Active Self           Med Note Louretta Shorten, APRIL   Mon Oct 13, 2021 11:02 AM)    SODIUM FLUORIDE 5000 PLUS 1.1 % CREA dental cream [id number] Yes as directed. [provider] Taking Active   Tiotropium Bromide Monohydrate (SPIRIVA RESPIMAT) 1.25 MCG/ACT AERS [id number] Yes Inhale 2 puffs into the lungs daily. [provider] Taking Active Self  valACYclovir (VALTREX) 1000 MG tablet [id number] Yes Take 1 tablet (1,000 mg total) by mouth 2 (two) times daily.  Patient taking differently: Take 1,000 mg by mouth as needed (fever blisters/cold sores.).   Hali Marry, MD Taking Active Self            Patient Active Problem List   Diagnosis Date Noted   Stress and adjustment reaction 04/08/2022   Gastroesophageal reflux disease 02/16/2022   Complete heart block ([place]) 10/24/2021   Osteoporosis 02/28/2021   Primary osteoarthritis of left knee 01/14/2021   Irritable bowel syndrome with constipation 09/16/2020   Aortic atherosclerosis ([place]) 06/11/2020   Benign lipomatous neoplasm of skin and subcutaneous tissue of left leg 06/11/2020   Paroxysmal VT ([name]) 02/14/2020   Chronic constipation 10/06/2019   Moderate persistent asthma with exacerbation 08/29/2019   Genetic testing 08/14/2019   Ductal carcinoma in situ (DCIS) of right breast 06/15/2019   Arthralgia 04/11/2019   Malignant tumor of breast ([place]) 03/06/2019   Fatigue 03/06/2019   Status post total shoulder arthroplasty, right 03/10/2018   DDD (degenerative disc disease), cervical 11/19/2017   Peripheral edema 05/19/2017   Microcytic anemia 05/14/2017   Delayed sleep phase syndrome 10/29/2016   Primary osteoarthritis of left  ankle 09/24/2016   Gastrointestinal food sensitivity 05/21/2016   History of arthroplasty of right shoulder 02/24/2016   Bilateral foot pain 01/22/2016  NASH (nonalcoholic steatohepatitis) 05/26/2015    Class: Stage 3   Nasal septal perforation 07/11/2014   Controlled diabetes mellitus type 2 with complications ([place]) 81/15/7262   Physical deconditioning 04/24/2014   Obesity, Class II, BMI 35-39.9, with comorbidity 06/09/2013   OSA (obstructive sleep apnea) 03/55/9741   Chronic systolic heart failure ([place]) 05/23/2013   Asthma, moderate persistent 04/18/2013   Chronic cough 04/18/2013   Breast cancer of upper-inner quadrant of left female breast ([place]) 03/18/2010   Hyperlipidemia 11/07/2009   Fibromyalgia 10/29/2009   Biventricular cardiac pacemaker in situ 10/23/2009   ALKALINE PHOSPHATASE, ELEVATED 10/13/2009   Hypothyroidism 10/03/2009   Benign essential hypertension 07/02/2009   Chronic back pain 07/02/2009    Immunization History  Administered Date(s) Administered   Fluad Quad(high Dose 65+) 04/11/2019, 05/02/2020, 04/18/2021, 04/24/2021   Influenza Split 05/10/2012, 05/04/2014   Influenza Whole 06/14/2009, 05/08/2010, 04/24/2011   Influenza, High Dose Seasonal PF 05/17/2017, 05/24/2018, 04/11/2019, 05/02/2020, 05/30/2022   Influenza,inj,Quad PF,6+ Mos 04/28/2013, 05/04/2014, 05/06/2015, 05/18/2016   Moderna Covid-19 Vaccine Bivalent Booster 43yr & up 05/30/2022   Moderna SARS-COV2 Booster Vaccination 10/29/2021   Moderna Sars-Covid-2 Vaccination 11/01/2019, 11/29/2019, 07/19/2020   Pneumococcal Conjugate-13 05/25/2014   Pneumococcal Polysaccharide-23 11/28/2012, 12/28/2018   Td 08/24/2004, 03/07/2010   Tdap 07/14/2012, 07/27/2022   Zoster Recombinat (Shingrix) 10/06/2017, 01/16/2020   Zoster, Live 04/24/2011    Conditions to be addressed/monitored: HTN, HLD, and DMII  There are no care plans that you recently modified to display for this  patient.        Medication Assistance:  Pending  Patient's preferred pharmacy is:  [id number] [place] [place] [address] [place] [place] [address]Phone: [phone]: [phone] [facility] (OM[place] CBoyne FallsLTexas [facility]2[address] CenterLCloverdaleSuite [address] [place]Phone: [phone]: [phone]- [place] - [address] [place] [address]Phone: [phone]: [phone] [place] [address]Phone: [phone]: [phone]  Uses pill box? Yes Pt endorses 100% compliance  Follow Up:  Patient agrees to Care Plan and Follow-up.  Plan: Telephone follow up appointment with care management team member scheduled for:  1 month  KLarinda Buttery PharmD Clinical Pharmacist CMetairie La Endoscopy [facility] LLCPrim[facility]St [facility] [phone]

## 2022-09-15 ENCOUNTER — Telehealth: Payer: 59

## 2022-09-22 ENCOUNTER — Other Ambulatory Visit: Payer: Self-pay | Admitting: Family Medicine

## 2022-09-23 DIAGNOSIS — E1159 Type 2 diabetes mellitus with other circulatory complications: Secondary | ICD-10-CM | POA: Diagnosis not present

## 2022-09-23 DIAGNOSIS — I5020 Unspecified systolic (congestive) heart failure: Secondary | ICD-10-CM | POA: Diagnosis not present

## 2022-09-24 ENCOUNTER — Telehealth: Payer: Self-pay

## 2022-09-24 DIAGNOSIS — E118 Type 2 diabetes mellitus with unspecified complications: Secondary | ICD-10-CM | POA: Diagnosis not present

## 2022-09-24 NOTE — Telephone Encounter (Signed)
Patient made aware.

## 2022-09-24 NOTE — Telephone Encounter (Signed)
[employer] PAP shipment for Ozempic 2 mg/39m (5 boxes) received this morning. Please contact the patient to come and pick up their order today. Placed in the fridge with patient identifier. Thanks in advance.   [place] [id number]: [id number]: 2024-05-23

## 2022-09-24 NOTE — Progress Notes (Signed)
Cardiology Office Note Date:  09/24/2022  Patient ID:  Tanya Hogan 1951/02/24, MRN [id number] PCP:  Tanya Marry, MD  Cardiologist:  Dr. Johney Hogan Electrophysiologist: Dr. Lovena Hogan    Chief Complaint:  post hospital  History of Present Illness: Tanya Hogan is a 72 y.o. female with history of HTN, HLD, DM, LBBB, NICM, OSA w/CPAP, CHB, chronic CHF (systolic), w/CRT-P, PVCs, breast Ca (s/p mastectomy) Nonalcoholic cirrhosis (resolved?)  She saw Dr. Lovena Hogan 02/20/22, doing well, with upgrade of her device to LB/conduction system pacing HF symptoms were improved, PVC burden low with 99% BP  She saw Dr. Johney Hogan 06/29/22, struggling with constipation, felt bloated, uncomfortable, seeing GI without improvement, Ozempic held to see if this helped her GI issues. Noted GDMT limited 2/2 Itching with ARB Low BG with jardiance  She saw Tanya Hogan, Tanya Hogan 09/09/22, recently admitted to [place] She summarizes: Admitted 1/5-06/2023 discharge summary, "#Symptomatic VTach. Cardioverted to pacemaker rhythm with IV amiodarone. Patient has history of prior episodes in the past and had been maintained on low dose amiodarone. Cardiology recommending optimization w/ GDMT for acute SHF, prior TTE w/ 20% EF. cardiology recommended continuing her on higher dose of amiodarone and having her f/u with her EP physician at [facility], Dr. Cristopher Peru. Cardiology placed her on crestor (discontinued pravastatin) and also increased dose of coreg. Unfortunately, pt did not tolerate addition of farxiga or spironolactone secondary to symptomatic hypotension with dizziness. She is unsure if she will be able to tolerate Entresto. I have told her to hold entresto if blood pressure low.    At her visit with [facility], she reported feeling better, did have a flury of some palpitations after her discharged though nothing noted by her device via transmission. Home BPs running in the 90's D/w Dr. Lovena Hogan, planned to continue  amiodarone '200mg'$  BID until seen by EP Planned to refer to HF clinic/team to help with GDMT given her numerous intolerances, tendency towards low BPs   Subsequent labs/messaging/ER visit for hyperkalemia, Tanya Hogan was addressing  09/11/22 K+ 4.5 BUN/Creat 21/1.01 Mag 2.0 LFTs wnl CBC ok  2011: LVEF 55-60% Jan 2023 LVEF 20% ([facility]) May 2023 LVEF 20-25% Jan 2024 LVEF 25-30% ([facility])  TODAY She has had a couple light "butterfly" flutters since home, nothing that is anything she felt going to the hospital She states she was grocery shopping when she suddenly became heavy in the chest, very SOB, was able to get to the front of the store and staff helped her, eventually to her car and she drive to the ER. She did not have syncope, but events in the ER were a blur to her.  She struggles with IBS and thinks the ozempic and coreg add to this.  She is very thirsty though following her fluid restriction and taking her meds exactly as ordered,  she would like some consideration to adjustments to both her allowed fluid intake and bumex dosing  Review of CLE from [facility] Jan 4th 9:30mn of VT, avg HR 203 Jan 5th 52 minutes of VT, avg HR 240  K+ on admission only slightly low 3.4 and mag was 1/9    Device information MDT CRT-P implanted 11/14/2001 >> gen change and LB area lead placed 11/03/21 She has an abandoned RV (apical) lead  At time of gen change, LB lead addition, discussed elevated LV thresholds and wide QRS despite CRT pacing   Amiodarone goes back to 2021   Past Medical History:  Diagnosis Date  Alkaline phosphatase elevation    Asthma    Breast cancer ([place]) 2011   Carpal tunnel syndrome    bilateral   Chronic HFrEF (heart failure with reduced ejection fraction) (HCC)    Cirrhosis, non-alcoholic (HCC)    Closed fracture of unspecified part of radius (alone)    DDD (degenerative disc disease), lumbar    Depression    pt denies   Diabetes mellitus without complication  ([place])    Enlarged heart    Fibromyalgia    GERD (gastroesophageal reflux disease)    History of kidney stones    HTN (hypertension)    Hypothyroidism    IBS (irritable bowel syndrome)    LBBB (left bundle branch block)    Microcytic anemia 05/14/2017   Mitral regurgitation    NICM (nonischemic cardiomyopathy) (HCC)    Pneumonia    Presence of permanent cardiac pacemaker    Pulmonary hypertension (HCC)    PVD (peripheral vascular disease) ([place])    2019   Sleep apnea    uses cpap   Tricuspid regurgitation    Ventricular tachyarrhythmia [facility])     Past Surgical History:  Procedure Laterality Date   BIV PACEMAKER GENERATOR CHANGEOUT N/A 11/03/2021   Procedure: BIV PACEMAKER GENERATOR CHANGEOUT;  Surgeon: Evans Lance, MD;  Location: [place] CV LAB;  Service: Cardiovascular;  Laterality: N/A;   BREAST LUMPECTOMY Left 2011   COLONOSCOPY     FOOT SURGERY     KNEE SURGERY     LEAD REVISION/REPAIR N/A 11/03/2021   Procedure: LEAD REVISION/REPAIR;  Surgeon: Evans Lance, MD;  Location: [place] CV LAB;  Service: Cardiovascular;  Laterality: N/A;   MASTECTOMY Right    05/2019   PACEMAKER GENERATOR CHANGE N/A 08/23/2014   Procedure: PACEMAKER GENERATOR CHANGE;  Surgeon: Evans Lance, MD;  Location: [facility] CATH LAB;  Service: Cardiovascular;  Laterality: N/A;   PACEMAKER PLACEMENT  2009   [place] [facility]   RECTAL SURGERY  1976   REDUCTION MAMMAPLASTY Left    05/2019   TOTAL SHOULDER ARTHROPLASTY Right 03/10/2018   TOTAL SHOULDER ARTHROPLASTY Right 03/10/2018   Procedure: RIGHT TOTAL SHOULDER ARTHROPLASTY;  Surgeon: [name], MD;  Location: [place];  Service: Orthopedics;  Laterality: Right;   TUBAL LIGATION      Current Outpatient Medications  Medication Sig Dispense Refill   acetaminophen (TYLENOL) 500 MG tablet Take 500-1,000 mg by mouth every 6 (six) hours as needed (for pain.).     albuterol (PROVENTIL) (2.5 MG/3ML) 0.083% nebulizer solution Take 2.5 mg by  nebulization every 6 (six) hours as needed for wheezing or shortness of breath.     albuterol (VENTOLIN HFA) 108 (90 Base) MCG/ACT inhaler Inhale 1-2 puffs into the lungs every 6 (six) hours as needed for wheezing or shortness of breath. 54 g 3   alendronate (FOSAMAX) 70 MG tablet TAKE 1 TABLET BY MOUTH  EVERY 7 DAYS WITH A FULL  GLASS OF WATER ON AN EMPTY  STOMACH 12 tablet 3   AMBULATORY NON FORMULARY MEDICATION Medication Name: Tubing and supplies for nebulizer. Fax - [phone] 1 vial 0   amiodarone (PACERONE) 200 MG tablet Take 200 mg by mouth 2 (two) times daily with a meal.     bumetanide (BUMEX) 1 MG tablet Take 1 tablet (1 mg total) by mouth daily as needed (weight gain or fluid retention). 90 tablet 1   carvedilol (COREG) 6.25 MG tablet Take 6.25 mg by mouth in the morning and at bedtime.  clotrimazole-betamethasone (LOTRISONE) cream 1 Application 2 (two) times daily.     Continuous Blood Gluc Receiver (FREESTYLE LIBRE 2 READER) DEVI by Does not apply route.     Continuous Blood Gluc Sensor (FREESTYLE LIBRE 2 SENSOR) MISC 1 Device by Does not apply route every 14 (fourteen) days. 6 each 1   fexofenadine (ALLEGRA) 180 MG tablet Take 180 mg by mouth in the morning.     fluticasone-salmeterol (ADVAIR HFA) 115-21 MCG/ACT inhaler Inhale 2 puffs into the lungs 2 (two) times daily.     glucose blood test strip To be used to check blood glucose Dx:E11.8 FreeStyle Precision blood glucose for Freestyle Libre 2 100 each 12   levothyroxine (SYNTHROID) 88 MCG tablet TAKE 1 TABLET(88 MCG) BY MOUTH DAILY 90 tablet 0   LINZESS 72 MCG capsule Take 72-144 mcg by mouth 4 (four) times daily as needed (constipation.).     MAGNESIUM PO Take 1 tablet by mouth daily.     Multiple Vitamins-Minerals (OCUVITE EYE + MULTI) TABS Take 1 tablet by mouth in the morning.     Omega-3 Fatty Acids (FISH OIL) 1000 MG CAPS Take by mouth.     pantoprazole (PROTONIX) 40 MG tablet Take 1 tablet (40 mg total) by mouth  daily before breakfast. 90 tablet 0   pravastatin (PRAVACHOL) 40 MG tablet Take 1 tablet (40 mg total) by mouth every evening. 90 tablet 3   Semaglutide,0.25 or 0.'5MG'$ /DOS, (OZEMPIC, 0.25 OR 0.5 MG/DOSE,) 2 MG/1.5ML SOPN Inject 0.5 mg into the skin every Saturday.     SODIUM FLUORIDE 5000 PLUS 1.1 % CREA dental cream as directed.     Tiotropium Bromide Monohydrate (SPIRIVA RESPIMAT) 1.25 MCG/ACT AERS Inhale 2 puffs into the lungs daily.     valACYclovir (VALTREX) 1000 MG tablet Take 1 tablet (1,000 mg total) by mouth 2 (two) times daily. (Patient taking differently: Take 1,000 mg by mouth as needed (fever blisters/cold sores.).) 14 tablet 2   No current facility-administered medications for this visit.    Allergies:   Injectafer [ferric carboxymaltose], Penicillins, Accupril [quinapril hcl], Aleve [naproxen], Celebrex [celecoxib], Cozaar [losartan potassium], Flonase [fluticasone], Levomenol, Lipitor [atorvastatin], Nasonex [mometasone furoate], Xyzal [levocetirizine], Chamomile, Entresto [sacubitril-valsartan], Jardiance [empagliflozin], Vesicare [solifenacin], Aspirin, Baking soda-fluoride [sodium fluoride], Clindamycin/lincomycin, Codeine, Furosemide, Lactose intolerance (gi), Lanolin-petrolatum, Metformin and related, Miralax [polyethylene glycol], Quinine, Reclast [zoledronic acid], Rifampin, Sulfa antibiotics, Sulfonamide derivatives, Tramadol, Wellbutrin [bupropion], and Wool alcohol [lanolin]   Social History:  The patient  reports that she quit smoking about 56 years ago. Her smoking use included cigarettes. She has never used smokeless tobacco. She reports that she does not drink alcohol and does not use drugs.   Family History:  The patient's family history includes Breast cancer in an other family member; Heart disease in her brother, father, and mother; Heart failure in an other family member; Hypertension in an other family member; Melanoma in her mother; Parkinson's disease in her  mother.  ROS:  Please see the history of present illness.    All other systems are reviewed and otherwise negative.   PHYSICAL EXAM:  VS:  There were no vitals taken for this visit. BMI: There is no height or weight on file to calculate BMI. Well nourished, well developed, in no acute distress HEENT: normocephalic, atraumatic Neck: no JVD, carotid bruits or masses Cardiac:  RRR; no significant murmurs, no rubs, or gallops Lungs:  CTA b/l, no wheezing, rhonchi or rales Abd: soft, nontender MS: no deformity or atrophy Ext: no edema Skin: warm  and dry, no rash Neuro:  No gross deficits appreciated Psych: euthymic mood, full affect  PPM site is stable, no tethering or discomfort   EKG:  not done today  Device interrogation done today and reviewed by myself:  Battery and lead measurements are good No further arrhythmias OptiVol is well below treshold  08/29/22: TTE Left Ventricle: Systolic function is severely abnormal. EF: 25-30%.  Quantitative analysis of left ventricular Global Longitudinal Strain (GLS)  imaging is -4.400%, which is abnormal. Ejection fraction measured by 3D is  28%, which is abnormal.    Left Ventricle: Wall thickness is normal.    Left Ventricle: Left ventricle is severely dilated.    Left Ventricle: There is severe  hypokinesis of the left ventricle.    Left Ventricle: Doppler parameters consistent with moderate diastolic  dysfunction and elevated LA pressure.    Left Atrium: Left atrium is moderately dilated at 5.200 cmLeft atrium  volume index is severely increased (>48 mL/m2).    Right Ventricle: A pacing/ICD lead is noted in the right ventricle.    Mitral Valve: Mitral valve structure is normal. The leaflets are mildly  thickened.   Mitral Valve: There is moderate regurgitation with a centrally directed  jet.   Tricuspid Valve: There is mild to moderate regurgitation.    Tricuspid Valve: The right ventricular systolic pressure is moderately   elevated (50-59 mmHg).   01/08/22: limited echo 1. Left ventricular ejection fraction, by estimation, is 20 to 25%. The  left ventricle has severely decreased function. The left ventricle  demonstrates regional wall motion abnormalities (see scoring  diagram/findings for description). The left  ventricular internal cavity size was mildly to moderately dilated. Left  ventricular diastolic parameters are consistent with Grade I diastolic  dysfunction (impaired relaxation). Elevated left ventricular end-diastolic  pressure.   2. Trivial mitral valve regurgitation.   3. The aortic valve is tricuspid.   4. There is normal pulmonary artery systolic pressure.   Comparison(s): Care Everywhere @ [facility] 09/21/21 EF 20%.   09/21/2021: TTE The study was technically difficult.    Left Ventricle: Left ventricle is mildly dilated.Systolic function is  severely abnormal. EF: 20 %.    Right Ventricle: Right ventricle is mildly dilated. Systolic function  is moderately reduced. Abnormal tricuspid annular plane systolic excursion  (TAPSE) <1.7 cm.    Left Atrium: Left atrium is mildly dilated.    Mitral Valve: There is mild regurgitation with a centrally directed  jet.   Tricuspid Valve: The right ventricular systolic pressure is mildly  elevated (37-49 mmHg).    Recent Labs: 07/07/2022: TSH 2.78 08/10/2022: Brain Natriuretic Peptide 193 09/11/2022: ALT 22; BUN 21; Creatinine, Ser 1.01; Hemoglobin 13.8; Magnesium 2.0; Platelets 279; Potassium 4.5; Sodium 136  No results found for requested labs within last 365 days.   CrCl cannot be calculated (Unknown ideal weight.).   Wt Readings from Last 3 Encounters:  09/09/22 214 lb 12.8 oz (97.4 kg)  09/07/22 216 lb (98 kg)  08/25/22 204 lb (92.5 kg)     Other studies reviewed: Additional studies/records reviewed today include: summarized above  ASSESSMENT AND PLAN:  PPM Intact function, no   VT PVCs Chronic amiodarone  D/w Dr.  Lovena Hogan Device system extraction would be required in order to put ICD and the risk of the procedure he feels far outweighs the potential benefit. Contiue amiodarone '200mg'$  BID for another month Then '200mg'$  daily Monday-Thursday and '200mg'$  BID Fri-Sun To see him in 84mo  NICM Chronic CHF (  systolic) Volume stable C/w Dr. Lenoria Chime Pending HF consult I will send the note to TanyaDunn, PA to weigh in on her fluid restriction, bumex, K+ management   Disposition: F/u with Dr. Lovena Hogan in 11mo EP, sooner if needed  Current medicines are reviewed at length with the patient today.  The patient did not have any concerns regarding medicines.  SVenetia Night PA-C 09/24/2022 8:23 AM     [place] [phone])  [phone](fax)

## 2022-09-25 ENCOUNTER — Telehealth: Payer: 59

## 2022-09-25 ENCOUNTER — Encounter: Payer: 59 | Attending: Physician Assistant | Admitting: Physician Assistant

## 2022-09-25 ENCOUNTER — Telehealth: Payer: Self-pay

## 2022-09-25 ENCOUNTER — Encounter: Payer: Self-pay | Admitting: Physician Assistant

## 2022-09-25 VITALS — BP 132/60 | HR 65 | Ht 61.0 in | Wt 214.0 lb

## 2022-09-25 DIAGNOSIS — Z950 Presence of cardiac pacemaker: Secondary | ICD-10-CM | POA: Diagnosis not present

## 2022-09-25 DIAGNOSIS — Z79899 Other long term (current) drug therapy: Secondary | ICD-10-CM | POA: Diagnosis not present

## 2022-09-25 DIAGNOSIS — I472 Ventricular tachycardia, unspecified: Secondary | ICD-10-CM

## 2022-09-25 DIAGNOSIS — I5022 Chronic systolic (congestive) heart failure: Secondary | ICD-10-CM | POA: Diagnosis not present

## 2022-09-25 DIAGNOSIS — I428 Other cardiomyopathies: Secondary | ICD-10-CM

## 2022-09-25 LAB — CUP PACEART REMOTE DEVICE CHECK
Battery Remaining Longevity: 85 mo
Battery Voltage: 3.02 V
Brady Statistic AP VP Percent: 86.99 %
Brady Statistic AP VS Percent: 0.17 %
Brady Statistic AS VP Percent: 11.24 %
Brady Statistic AS VS Percent: 1.6 %
Brady Statistic RA Percent Paced: 88.12 %
Brady Statistic RV Percent Paced: 98.23 %
Date Time Interrogation Session: 20240201212557
Implantable Lead Connection Status: 753985
Implantable Lead Connection Status: 753985
Implantable Lead Connection Status: 753985
Implantable Lead Implant Date: 20030324
Implantable Lead Implant Date: 20030324
Implantable Lead Implant Date: 20230313
Implantable Lead Location: 753858
Implantable Lead Location: 753859
Implantable Lead Location: 753860
Implantable Lead Model: 3830
Implantable Lead Model: 4068
Implantable Lead Model: 4193
Implantable Pulse Generator Implant Date: 20230313
Lead Channel Impedance Value: 3306 Ohm
Lead Channel Impedance Value: 3306 Ohm
Lead Channel Impedance Value: 3306 Ohm
Lead Channel Impedance Value: 361 Ohm
Lead Channel Impedance Value: 361 Ohm
Lead Channel Impedance Value: 456 Ohm
Lead Channel Impedance Value: 513 Ohm
Lead Channel Impedance Value: 627 Ohm
Lead Channel Impedance Value: 779 Ohm
Lead Channel Pacing Threshold Amplitude: 0.75 V
Lead Channel Pacing Threshold Amplitude: 2.125 V
Lead Channel Pacing Threshold Pulse Width: 0.4 ms
Lead Channel Pacing Threshold Pulse Width: 0.8 ms
Lead Channel Sensing Intrinsic Amplitude: 1.125 mV
Lead Channel Sensing Intrinsic Amplitude: 1.125 mV
Lead Channel Sensing Intrinsic Amplitude: 15 mV
Lead Channel Sensing Intrinsic Amplitude: 15 mV
Lead Channel Setting Pacing Amplitude: 2 V
Lead Channel Setting Pacing Amplitude: 2.5 V
Lead Channel Setting Pacing Amplitude: 2.5 V
Lead Channel Setting Pacing Pulse Width: 0.4 ms
Lead Channel Setting Pacing Pulse Width: 0.8 ms
Lead Channel Setting Sensing Sensitivity: 2.8 mV
Zone Setting Status: 755011
Zone Setting Status: 755011

## 2022-09-25 LAB — CUP PACEART INCLINIC DEVICE CHECK
Date Time Interrogation Session: 20240202170251
Implantable Lead Connection Status: 753985
Implantable Lead Connection Status: 753985
Implantable Lead Connection Status: 753985
Implantable Lead Implant Date: 20030324
Implantable Lead Implant Date: 20030324
Implantable Lead Implant Date: 20230313
Implantable Lead Location: 753858
Implantable Lead Location: 753859
Implantable Lead Location: 753860
Implantable Lead Model: 3830
Implantable Lead Model: 4068
Implantable Lead Model: 4193
Implantable Pulse Generator Implant Date: 20230313

## 2022-09-25 NOTE — Patient Instructions (Addendum)
Medication Instructions:   FOR ONE MORE MONTH : TAKE AMIODARONE 200 MG TWICE A DAY   THEN START TAKING : 1. AMIODARONE 200 MG ONCE A DAY MONDAY THRU THURSDAY                                            2. AMIODARONE  200 MG TWICE A DAY [place]      *If you need a refill on your cardiac medications before your next appointment, please call your pharmacy*   Lab Work:  BMET  AND TSH TODAY    If you have labs (blood work) drawn today and your tests are completely normal, you will receive your results only by: MyChart Message (if you have MyChart) OR A paper copy in the mail If you have any lab test that is abnormal or we need to change your treatment, we will call you to review the results.   Testing/Procedures: NONE ORDERED  TODAY      Follow-Up: At [facility], you and your health needs are our priority.  As part of our continuing mission to provide you with exceptional heart care, we have created designated Provider Care Teams.  These Care Teams include your primary Cardiologist (physician) and Advanced Practice Providers (APPs -  Physician Assistants and Nurse Practitioners) who all work together to provide you with the care you need, when you need it.  We recommend signing up for the patient portal called "MyChart".  Sign up information is provided on this After Visit Summary.  MyChart is used to connect with patients for Virtual Visits (Telemedicine).  Patients are able to view lab/test results, encounter notes, upcoming appointments, etc.  Non-urgent messages can be sent to your provider as well.   To learn more about what you can do with MyChart, go to [email or web address].    Your next appointment:   6 month(s)  Provider:   You may see Dr. Lovena Le  or one of the following Advanced Practice Providers on your designated Care Team:       Other Instructions

## 2022-09-25 NOTE — Telephone Encounter (Signed)
   CCM RN Visit Note   09-25-2022 Name: ARASELY AKKERMAN MRN: [id number]      DOB: 1950-10-15  Subjective: Tanya Hogan is a 72 y.o. year old female who is a primary care patient of Dr. Madilyn Fireman The patient was referred to the Chronic Care Management team for assistance with care management needs subsequent to provider initiation of CCM services and plan of care.      An unsuccessful telephone outreach was attempted today to contact the patient about Chronic Care Management needs.    Plan:A HIPAA compliant phone message was left for the patient providing contact information and requesting a return call.  Noreene Larsson RN, MSN, CCM RN Care Manager  Chronic Care Management Direct Number: [phone]

## 2022-09-26 ENCOUNTER — Other Ambulatory Visit: Payer: Self-pay | Admitting: Family Medicine

## 2022-09-26 LAB — BASIC METABOLIC PANEL
BUN/Creatinine Ratio: 15 (ref 12–28)
BUN: 13 mg/dL (ref 8–27)
CO2: 22 mmol/L (ref 20–29)
Calcium: 10.2 mg/dL (ref 8.7–10.3)
Chloride: 108 mmol/L — ABNORMAL HIGH (ref 96–106)
Creatinine, Ser: 0.85 mg/dL (ref 0.57–1.00)
Glucose: 109 mg/dL — ABNORMAL HIGH (ref 70–99)
Potassium: 4.5 mmol/L (ref 3.5–5.2)
Sodium: 143 mmol/L (ref 134–144)
eGFR: 72 mL/min/{1.73_m2} (ref >59)

## 2022-09-26 LAB — TSH: TSH: 3.88 u[IU]/mL (ref 0.450–4.500)

## 2022-09-28 ENCOUNTER — Telehealth: Payer: Self-pay | Admitting: Physician Assistant

## 2022-09-28 MED ORDER — CARVEDILOL 6.25 MG PO TABS: 6.2500 mg | ORAL_TABLET | Freq: Two times a day (BID) | ORAL | 1 refills | Status: DC

## 2022-09-28 MED ORDER — BUMETANIDE 1 MG PO TABS: ORAL_TABLET | ORAL | 1 refills | Status: DC

## 2022-09-28 NOTE — Telephone Encounter (Addendum)
Received msg from Tommye Standard PA-C that patient had questions about fluid regimen - appreciate the follow-up. I called Tanya Hogan who informed me that over the last week, she has been having to take her Bumex '1mg'$  daily (was most recently PRN) in order to maintain her normal fluid status. In the past it appears she required as much as BID dosing, but this has been a moving target per note review.  Yesterday her weight jumped up to 215 on the PM scale (she usually weighs in AM) with some increase in SOB so she took an extra 1 tablet with improvement down to 210. She is feeling much better today. Her dry weight is 208 at home per her report. She feels she needs to go back to a scheduled regimen of 1 tablet daily, with extra 1 tablet daily as needed for additional weight gain. Her labs were stable on 2/2 - she reports this was drawn after she had been back on the Bumex pretty much every day for the last week. She has not had any hypotension at home. She denies excessive fluid intake. We discussed that her prior high potassium level was likely medication related.  At her request I sent in the updated Bumex rx to 1 tablet daily with 1 extra tablet daily PRN weight gain 3lb above baseline (which for her is 211 or above) as well as carvedilol refills to the [facility] in [place]. (I spoke with [facility] to verify the Bumex rx.) We also moved her f/u appt to 2/21 with Dr. Johney Frame on a hold slot - appropriate for TOC f/u. Still awaiting scheduling from CHF clinic.  She also requests amiodarone rx be sent in as well - the dosing looks very specific so I will defer to EP to send in. Thank you!

## 2022-09-29 ENCOUNTER — Ambulatory Visit: Payer: Medicare Other | Admitting: Nurse Practitioner

## 2022-10-02 ENCOUNTER — Telehealth: Payer: Self-pay | Admitting: Cardiology

## 2022-10-02 ENCOUNTER — Encounter: Payer: Self-pay | Admitting: Cardiology

## 2022-10-02 DIAGNOSIS — G4733 Obstructive sleep apnea (adult) (pediatric): Secondary | ICD-10-CM | POA: Diagnosis not present

## 2022-10-02 DIAGNOSIS — G4737 Central sleep apnea in conditions classified elsewhere: Secondary | ICD-10-CM | POA: Diagnosis not present

## 2022-10-02 NOTE — Telephone Encounter (Signed)
Tanya Hogan states she is unable to message Dr. Johney Frame on [place] and she would like assistance with doing so.

## 2022-10-02 NOTE — Telephone Encounter (Signed)
Pt is setting up her mychart again and was calling our office to ask if we could provide her with the  mychart help desk information and call back number, so that she could have them assist her in troubleshooting a mychart/IT problem she is experiencing.   Provided the pt with the mychart help desk number to call at [phone].   Pt verbalized understanding and agrees with this plan.  Pt was more than gracious for all the assistance provided.

## 2022-10-06 ENCOUNTER — Other Ambulatory Visit: Payer: Self-pay | Admitting: *Deleted

## 2022-10-06 DIAGNOSIS — E118 Type 2 diabetes mellitus with unspecified complications: Secondary | ICD-10-CM

## 2022-10-06 MED ORDER — FREESTYLE LIBRE 2 READER DEVI: 4 refills | Status: DC

## 2022-10-07 ENCOUNTER — Other Ambulatory Visit: Payer: Self-pay | Admitting: *Deleted

## 2022-10-07 DIAGNOSIS — E119 Type 2 diabetes mellitus without complications: Secondary | ICD-10-CM | POA: Diagnosis not present

## 2022-10-07 DIAGNOSIS — L602 Onychogryphosis: Secondary | ICD-10-CM | POA: Diagnosis not present

## 2022-10-07 DIAGNOSIS — M79671 Pain in right foot: Secondary | ICD-10-CM | POA: Diagnosis not present

## 2022-10-07 DIAGNOSIS — M79672 Pain in left foot: Secondary | ICD-10-CM | POA: Diagnosis not present

## 2022-10-07 MED ORDER — AMIODARONE HCL 200 MG PO TABS: 200.0000 mg | ORAL_TABLET | Freq: Every day | ORAL | 1 refills | Status: DC

## 2022-10-07 NOTE — Telephone Encounter (Addendum)
Hi team! Just wanted to make sure you saw this msg from 2/5, needing amiodarone rx sent in (see bolded part). Thanks!

## 2022-10-09 ENCOUNTER — Encounter: Payer: Self-pay | Admitting: Cardiology

## 2022-10-09 ENCOUNTER — Encounter (INDEPENDENT_AMBULATORY_CARE_PROVIDER_SITE_OTHER): Payer: 59 | Admitting: Cardiology

## 2022-10-09 VITALS — BP 154/85 | HR 65 | Ht 61.0 in | Wt 217.6 lb

## 2022-10-09 DIAGNOSIS — I5020 Unspecified systolic (congestive) heart failure: Secondary | ICD-10-CM | POA: Diagnosis not present

## 2022-10-09 DIAGNOSIS — Z79899 Other long term (current) drug therapy: Secondary | ICD-10-CM

## 2022-10-09 DIAGNOSIS — I340 Nonrheumatic mitral (valve) insufficiency: Secondary | ICD-10-CM | POA: Diagnosis not present

## 2022-10-09 DIAGNOSIS — I361 Nonrheumatic tricuspid (valve) insufficiency: Secondary | ICD-10-CM

## 2022-10-09 DIAGNOSIS — I428 Other cardiomyopathies: Secondary | ICD-10-CM

## 2022-10-09 DIAGNOSIS — I5022 Chronic systolic (congestive) heart failure: Secondary | ICD-10-CM | POA: Diagnosis not present

## 2022-10-09 DIAGNOSIS — I472 Ventricular tachycardia, unspecified: Secondary | ICD-10-CM

## 2022-10-09 DIAGNOSIS — Z950 Presence of cardiac pacemaker: Secondary | ICD-10-CM

## 2022-10-09 MED ORDER — BUMETANIDE 1 MG PO TABS: 1.0000 mg | ORAL_TABLET | Freq: Two times a day (BID) | ORAL | 1 refills | Status: DC

## 2022-10-09 MED ORDER — AMIODARONE HCL 200 MG PO TABS: ORAL_TABLET | ORAL | 2 refills | Status: DC

## 2022-10-09 NOTE — Addendum Note (Signed)
Addended by: Claude Manges on: 10/09/2022 08:11 AM   Modules accepted: Orders

## 2022-10-09 NOTE — Patient Instructions (Signed)
Medication Instructions:   INCREASE YOUR BUMEX TO 1 MG BY MOUTH TWICE DAILY  *If you need a refill on your cardiac medications before your next appointment, please call your pharmacy*   You have been referred to [facility]--PATIENT WAS PREVIOUSLY REFERRED TO CHF CLINIC BUT NOBODY CALLED HER TO ARRANGE THIS APPOINTMENT--PATIENT NEEDS AN APPOINTMENT WITH THEM PER DR. Johney Frame     Lab Work:  IN ONE WEEK HERE IN THE OFFICE--BMET AND PRO-BNP  If you have labs (blood work) drawn today and your tests are completely normal, you will receive your results only by: [employer] (if you have MyChart) OR A paper copy in the mail If you have any lab test that is abnormal or we need to change your treatment, we will call you to review the results.    Follow-Up:  6 WEEKS WITH AN EXTENDER IN THE OFFICE--PATIENT HAS BEEN PREVIOUSLY SEEN BY [name] DUNN PA-C, SO IDEALLY DR. Johney Frame WOULD LIKE HER TO SEE THE PATIENT AND IF NO AVAILABILITY WITH HER, CAN SCHEDULE WITH AN APP

## 2022-10-09 NOTE — Progress Notes (Signed)
Cardiology Office Note:    Date:  10/09/2022   ID:  Tanya Hogan, DOB 1951-01-29, MRN [id number]  PCP:  Hali Marry, MD   [facility] Providers Cardiologist:  Freada Bergeron, MD {   Referring MD: Hali Marry, *    History of Present Illness:    Tanya Hogan is a 72 y.o. female with a hx of chronic systolic heart failure with LVEF 35-40%>20% on TTE 08/2021, NICM, CHB s/p BiV PPM placement s/p recent gent change in 0000000, nonalcoholic cirrhosis, DMII HTN, HLD, PVD, and breast cancer s/p mastectomy who presents to clinic for follow-up.  Per review of the record, the patient has a known history of NICM with cath in 01/2020 at OSH with normal coronaries. TTE 01/2020 with LVEF 35-40%.  Was admitted in the ED at [facility] in 08/2021 for decompensated HF with BNP 4700. She was started on bumex BID. Repeat TTE with EF 20%. She was started on spiro and jardiance at that time.  Saw Nicholes Rough in 09/2021 where she had low energy. She stopped the spiro with improvement. She was also on ozempic. Has not tolerated ARB due to itching.  She underwent generator change with Dr. Lovena Le in 10/2021.  Was seen in clinic on 12/2021 where she was feeling well with improved oxygen levels.  eported only wearing oxygen for saturation < 90%.  Takes extra Bumex if ankles become puffy.  Echocardiogram revealed LVEF 20 to 25% with no significant changes from prior echo report from [place].    Was admitted to [facility] 1/5-1/11/24 with arrhythmia (suspect primary dx VT), hypokalemia, elevated troponin to peak 171, prolonged QT interval, and CHF. She did not pass out. Per review of their discharge symmary,  patient presented with Vtach and converted to paced rhythm with IV amiodarone. Her coreg was increased and her amiodarone was uptitrated during that visit. GDMT has been limited due to significant intolerances including hyperK with ARB/entresto, worsening AKI with farxiga, orthostasis  with spironolactone. Repeat echo 08/29/22 EF 25-30%, severe HK of LV, diastolic dysfunction, severe LAE, moderate MR, mild-moderate TR, moderate elevation of RVSP. She was discharged on higher dose of amiodarone and carvedilol as well as new Entresto with recommendation to f/u with EP. Elevated troponin was felt due to demand ischemia.   Was last seen in clinic by Tommye Standard on 09/25/2022 where she was doing better from a CV standpoint. Was continued on amiodarone.   Today, the patient states that she has occasional flutters but no sustained events. States she has been off of the entresto due to hyperkalemia. She overall feels very weak. No chest pain, orthopnea, or PND. Continues to have significant dyspnea on exertion. Wt have been stable in 210-213lbs over the past couple of weeks, but she feels like she is retaining fluid. Also has been having GI symptoms which attributes to her low K diet. We spent a long time discussing that I am worried about her overall trajectory given frequent readmissions with HF, severely depressed EF, recent VT, and medication intolerances. She is adament that she would like to continue to pursue all treatments.   Past Medical History:  Diagnosis Date   Alkaline phosphatase elevation    Asthma    Breast cancer ([place]) 2011   Carpal tunnel syndrome    bilateral   Chronic HFrEF (heart failure with reduced ejection fraction) (HCC)    Cirrhosis, non-alcoholic (HCC)    Closed fracture of unspecified part of radius (alone)  DDD (degenerative disc disease), lumbar    Depression    pt denies   Diabetes mellitus without complication ([place])    Enlarged heart    Fibromyalgia    GERD (gastroesophageal reflux disease)    History of kidney stones    HTN (hypertension)    Hypothyroidism    IBS (irritable bowel syndrome)    LBBB (left bundle branch block)    Microcytic anemia 05/14/2017   Mitral regurgitation    NICM (nonischemic cardiomyopathy) (HCC)    Pneumonia     Presence of permanent cardiac pacemaker    Pulmonary hypertension (HCC)    PVD (peripheral vascular disease) ([place])    2019   Sleep apnea    uses cpap   Tricuspid regurgitation    Ventricular tachyarrhythmia [facility])     Past Surgical History:  Procedure Laterality Date   BIV PACEMAKER GENERATOR CHANGEOUT N/A 11/03/2021   Procedure: BIV PACEMAKER GENERATOR CHANGEOUT;  Surgeon: Evans Lance, MD;  Location: [place] [facility];  Service: Cardiovascular;  Laterality: N/A;   BREAST LUMPECTOMY Left 2011   COLONOSCOPY     FOOT SURGERY     KNEE SURGERY     LEAD REVISION/REPAIR N/A 11/03/2021   Procedure: LEAD REVISION/REPAIR;  Surgeon: Evans Lance, MD;  Location: [place] [facility];  Service: Cardiovascular;  Laterality: N/A;   MASTECTOMY Right    05/2019   PACEMAKER GENERATOR CHANGE N/A 08/23/2014   Procedure: PACEMAKER GENERATOR CHANGE;  Surgeon: Evans Lance, MD;  Location: [facility] CATH LAB;  Service: Cardiovascular;  Laterality: N/A;   PACEMAKER PLACEMENT  2009   [facility]   RECTAL SURGERY  1976   REDUCTION MAMMAPLASTY Left    05/2019   TOTAL SHOULDER ARTHROPLASTY Right 03/10/2018   TOTAL SHOULDER ARTHROPLASTY Right 03/10/2018   Procedure: RIGHT TOTAL SHOULDER ARTHROPLASTY;  Surgeon: [name], MD;  Location: [place];  Service: Orthopedics;  Laterality: Right;   TUBAL LIGATION      Current Medications: Current Meds  Medication Sig   acetaminophen (TYLENOL) 500 MG tablet Take 500-1,000 mg by mouth every 6 (six) hours as needed (for pain.).   albuterol (PROVENTIL) (2.5 MG/3ML) 0.083% nebulizer solution Take 2.5 mg by nebulization every 6 (six) hours as needed for wheezing or shortness of breath.   albuterol (VENTOLIN HFA) 108 (90 Base) MCG/ACT inhaler Inhale 1-2 puffs into the lungs every 6 (six) hours as needed for wheezing or shortness of breath.   alendronate (FOSAMAX) 70 MG tablet TAKE 1 TABLET BY MOUTH  EVERY 7 DAYS WITH A FULL  GLASS OF WATER ON AN EMPTY  STOMACH    AMBULATORY NON FORMULARY MEDICATION Medication Name: Tubing and supplies for nebulizer. Fax - [phone]   amiodarone (PACERONE) 200 MG tablet TAKE  200 MG ONCE A DAY MONDAY THRU THURSDAY  TAKE  200 MG TWICE A DAY FRIDAY THROUGH SUNDAY   aspirin 81 MG chewable tablet Chew by mouth daily.   bumetanide (BUMEX) 1 MG tablet Take 1 tablet (1 mg total) by mouth 2 (two) times daily.   carvedilol (COREG) 6.25 MG tablet Take 1 tablet (6.25 mg total) by mouth in the morning and at bedtime.   clotrimazole-betamethasone (LOTRISONE) cream 1 Application 2 (two) times daily.   Continuous Blood Gluc Receiver (FREESTYLE LIBRE 2 READER) DEVI 6 (six) sensors/90 day supply. 4 refills per year. DX:E11.8   Continuous Blood Gluc Sensor (FREESTYLE LIBRE 2 SENSOR) MISC 1 Device by Does not apply route every 14 (fourteen) days.  doxycycline (VIBRA-TABS) 100 MG tablet Take 100 mg by mouth 2 (two) times daily.   fexofenadine (ALLEGRA) 180 MG tablet Take 180 mg by mouth in the morning.   fluticasone-salmeterol (ADVAIR HFA) 115-21 MCG/ACT inhaler Inhale 2 puffs into the lungs 2 (two) times daily.   glucose blood test strip To be used to check blood glucose Dx:E11.8 FreeStyle Precision blood glucose for Freestyle Libre 2   levothyroxine (SYNTHROID) 88 MCG tablet TAKE 1 TABLET(88 MCG) BY MOUTH DAILY   LINZESS 72 MCG capsule Take 72-144 mcg by mouth 4 (four) times daily as needed (constipation.).   MAGNESIUM PO Take 1 tablet by mouth daily.   Multiple Vitamins-Minerals (OCUVITE EYE + MULTI) TABS Take 1 tablet by mouth in the morning.   Omega-3 Fatty Acids (FISH OIL) 1000 MG CAPS Take by mouth.   pantoprazole (PROTONIX) 40 MG tablet Take 1 tablet (40 mg total) by mouth daily before breakfast.   pravastatin (PRAVACHOL) 40 MG tablet Take 1 tablet (40 mg total) by mouth every evening.   Semaglutide,0.25 or 0.5MG/DOS, (OZEMPIC, 0.25 OR 0.5 MG/DOSE,) 2 MG/1.5ML SOPN Inject 0.25 mg into the skin every Saturday.   SODIUM  FLUORIDE 5000 PLUS 1.1 % CREA dental cream as directed.   Tiotropium Bromide Monohydrate (SPIRIVA RESPIMAT) 1.25 MCG/ACT AERS Inhale 2 puffs into the lungs daily.   valACYclovir (VALTREX) 1000 MG tablet Take 1 tablet (1,000 mg total) by mouth 2 (two) times daily. (Patient taking differently: Take 1,000 mg by mouth as needed (fever blisters/cold sores.).)   [DISCONTINUED] bumetanide (BUMEX) 1 MG tablet Take 1 tablet (1 mg) by mouth daily. May take an extra 1 tablet daily as needed for 3 pound weight gain above baseline.     Allergies:   Injectafer [ferric carboxymaltose], Penicillins, Accupril [quinapril hcl], Aleve [naproxen], Celebrex [celecoxib], Cozaar [losartan potassium], Flonase [fluticasone], Levomenol, Lipitor [atorvastatin], Nasonex [mometasone furoate], Xyzal [levocetirizine], Chamomile, Entresto [sacubitril-valsartan], Jardiance [empagliflozin], Vesicare [solifenacin], Aspirin, Baking soda-fluoride [sodium fluoride], Clindamycin/lincomycin, Codeine, Furosemide, Lactose intolerance (gi), Lanolin-petrolatum, Metformin and related, Miralax [polyethylene glycol], Quinine, Reclast [zoledronic acid], Rifampin, Sulfa antibiotics, Sulfonamide derivatives, Tramadol, Wellbutrin [bupropion], and Wool alcohol [lanolin]   Social History   Socioeconomic History   Marital status: Divorced    Spouse name: Not on file   Number of children: 2   Years of education: 14   Highest education level: Associate degree: academic program  Occupational History   Occupation: quality insurcance in [employer]    Comment: retired  Tobacco Use   Smoking status: Former    Packs/day: 0.00    Years: 0.00    Total pack years: 0.00    Types: Cigarettes    Quit date: 08/24/1966    Years since quitting: 56.1   Smokeless tobacco: Never  Vaping Use   Vaping Use: Never used  Substance and Sexual Activity   Alcohol use: No   Drug use: No   Sexual activity: Not Currently  Other Topics Concern   Not on file   Social History Narrative   Retired. Lives with and helps her brother. Likes Systems developer, Social worker and music. Loves animals.   Social Determinants of Health   Financial Resource Strain: Low Risk  (08/06/2022)   Overall Financial Resource Strain (CARDIA)    Difficulty of Paying Living Expenses: Not hard at all  Food Insecurity: No Food Insecurity (08/06/2022)   Hunger Vital Sign    Worried About Running Out of Food in the Last Year: Never true    Ran Out of Food  in the Last Year: Never true  Transportation Needs: No Transportation Needs (08/06/2022)   PRAPARE - Hydrologist (Medical): No    Lack of Transportation (Non-Medical): No  Physical Activity: Sufficiently Active (08/06/2022)   Exercise Vital Sign    Days of Exercise per Week: 5 days    Minutes of Exercise per Session: 60 min  Stress: No Stress Concern Present (08/06/2022)   [place]    Feeling of Stress : Not at all  Social Connections: Moderately Integrated (08/06/2022)   Social Connection and Isolation Panel [NHANES]    Frequency of Communication with Friends and Family: More than three times a week    Frequency of Social Gatherings with Friends and Family: Three times a week    Attends Religious Services: More than 4 times per year    Active Member of Clubs or Organizations: Yes    Attends Music therapist: More than 4 times per year    Marital Status: Divorced     Family History: The patient's family history includes Breast cancer in an other family member; Heart disease in her brother, father, and mother; Heart failure in an other family member; Hypertension in an other family member; Melanoma in her mother; Parkinson's disease in her mother.  ROS:   Review of Systems  Constitutional:  Positive for malaise/fatigue. Negative for chills and fever.  HENT:  Negative for ear pain and sore throat.    Eyes:  Negative for pain.  Respiratory:  Positive for shortness of breath. Negative for cough and sputum production.   Cardiovascular:  Negative for chest pain, palpitations, orthopnea, claudication, leg swelling and PND.  Gastrointestinal:  Positive for abdominal pain, constipation and nausea. Negative for heartburn and vomiting.  Genitourinary:  Negative for hematuria.  Musculoskeletal:  Negative for back pain.  Neurological:  Positive for weakness. Negative for speech change and loss of consciousness.  Endo/Heme/Allergies:  Does not bruise/bleed easily.  Psychiatric/Behavioral:  Positive for depression. Negative for hallucinations.      EKGs/Labs/Other Studies Reviewed:    The following studies were reviewed today: TTE 09-08-2022: Left Ventricle: Systolic function is severely abnormal. EF: 25-30%.  Quantitative analysis of left ventricular Global Longitudinal Strain (GLS)  imaging is -4.400%, which is abnormal. Ejection fraction measured by 3D is  28%, which is abnormal.    Left Ventricle: Wall thickness is normal.    Left Ventricle: Left ventricle is severely dilated.    Left Ventricle: There is severe  hypokinesis of the left ventricle.    Left Ventricle: Doppler parameters consistent with moderate diastolic  dysfunction and elevated LA pressure.    Left Atrium: Left atrium is moderately dilated at 5.200 cmLeft atrium  volume index is severely increased (>48 mL/m2).    Right Ventricle: A pacing/ICD lead is noted in the right ventricle.    Mitral Valve: Mitral valve structure is normal. The leaflets are mildly  thickened.   Mitral Valve: There is moderate regurgitation with a centrally directed  jet.   Tricuspid Valve: There is mild to moderate regurgitation.    Tricuspid Valve: The right ventricular systolic pressure is moderately  elevated (50-59 mmHg).   Echo  01/08/2022: Sonographer Comments: Technically difficult study due to poor echo windows and suboptimal apical window.  Image acquisition challenging due to patient body habitus. Definity not utilized due to hypersensitivity  IMPRESSIONS    1. Left ventricular ejection fraction, by estimation, is 20 to 25%. The  left ventricle has severely decreased function. The left ventricle  demonstrates regional wall motion abnormalities (see scoring  diagram/findings for description). The left  ventricular internal cavity size was mildly to moderately dilated. Left  ventricular diastolic parameters are consistent with Grade I diastolic  dysfunction (impaired relaxation). Elevated left ventricular end-diastolic  pressure.   2. Trivial mitral valve regurgitation.   3. The aortic valve is tricuspid.   4. There is normal pulmonary artery systolic pressure.   Comparison(s): Care Everywhere @ [facility] 09/21/21 EF 20%.   BIV PACEMAKER GENERATOR CHANGEOUT  11/03/2021: Conclusion: Successful insertion of a new left bundle area pacing lead, removal of a previously implanted BiV pacemaker which was approaching elective replacement, and insertion of a new biventricular pacing system utilizing left upper extremity venography in a patient with chronic systolic heart failure class III, and a paced QRS duration of 190 ms.   TTE 08/2021 (OSH): Left Ventricle  Left ventricle is mildly dilated. Wall thickness is normal. Systolic function is severely abnormal. EF: 20 %. Wall motion is normal.   Right Ventricle  Right ventricle is mildly dilated. Systolic function is moderately reduced. Abnormal tricuspid annular plane systolic excursion (TAPSE) <1.7 cm.   Left Atrium  Left atrium is mildly dilated.   Right Atrium  Right atrium size is normal.   IVC/SVC  The inferior vena cava demonstrates a diameter of >2.1 cm and collapses >50%; therefore, the right atrial pressure is estimated at 8 mmHg.   Mitral Valve  Mitral valve structure is normal. There is mild regurgitation with a centrally directed jet.   Tricuspid Valve  Tricuspid  valve structure is normal. There is trace regurgitation. The right ventricular systolic pressure is mildly elevated (37-49 mmHg).   Aortic Valve  The aortic valve was not well visualized.   Ascending Aorta  The aortic root is normal in size. The ascending aorta is normal in size.   Pericardium  There is no pericardial effusion.   Study Details  A complete echo was performed using complete 2D, color flow Doppler and spectral Doppler. During the study the apical, parasternal and subcostal views were captured. Overall the study quality was adequate. The study was technically difficult. The study was difficult due to patient's clinical status and body habitus.  EKG:  EKG is personally reviewed. No new tracing today  Recent Labs: 08/10/2022: Brain Natriuretic Peptide 193 09/11/2022: ALT 22; Hemoglobin 13.8; Magnesium 2.0; Platelets 279 09/25/2022: BUN 13; Creatinine, Ser 0.85; Potassium 4.5; Sodium 143; TSH 3.880   Recent Lipid Panel    Component Value Date/Time   CHOL 168 04/16/2021 0000   TRIG 120 04/16/2021 0000   HDL 73 04/16/2021 0000   CHOLHDL 2.3 04/16/2021 0000   VLDL 26 02/18/2017 0939   LDLCALC 74 04/16/2021 0000     Risk Assessment/Calculations:           Physical Exam:    VS:  BP (!) 154/85   Pulse 65   Ht 5' 1"$  (1.549 m)   Wt 217 lb 9.6 oz (98.7 kg)   SpO2 98%   BMI 41.12 kg/m     Wt Readings from Last 3 Encounters:  10/09/22 217 lb 9.6 oz (98.7 kg)  09/25/22 214 lb (97.1 kg)  09/09/22 214 lb 12.8 oz (97.4 kg)     GEN: Comfortable, NAD HEENT: Normal NECK: No JVD; No carotid bruits CARDIAC: RRR, no murmrus RESPIRATORY:  Clear bilaterally ABDOMEN: Obese, soft MUSCULOSKELETAL:  Trace edema, warm  SKIN: Warm and dry NEUROLOGIC:  Alert and oriented x 3 PSYCHIATRIC:  Normal affect   ASSESSMENT:    1. Chronic HFrEF (heart failure with reduced ejection fraction) ([place])   2. Medication management   3. Chronic systolic congestive heart failure (HCC)    4. HFrEF (heart failure with reduced ejection fraction) ([place])   5. NICM (nonischemic cardiomyopathy) ([place])   6. Ventricular tachycardia ([place])   7. Biventricular cardiac pacemaker in situ   8. Nonrheumatic tricuspid valve regurgitation   9. Nonrheumatic mitral valve regurgitation     PLAN:    In order of problems listed above:  #Chronic Systolic HF: #NICM:  TTE 123XX123 with EF 20-25% with inferior, basal-to-mid inferoseptal akniesis and lateral and anterior wall hypokinesis, G1DD. Cath 01/2020 without obstructive disease. Repeat TTE 08/2022 with EF 25-30%. GDMT limited due to allergies/sensitivities. Overall, concerned about her general decline and afraid she is reaching end stage HF. Discussed this with the patient given, however, she would like to continue to proceed with all medications/treatments at this time. Will refer to HF team as well.   -Okay to resume bumex 54m BID and monitor response -Continue coreg 3.1249mdaily -On ozempic; cannot titrate up due to significant GI symptoms -Did not jardiance due to worsening AKI -Did not tolerate entresto, ARB or spironolactone due to hyperkalemia -Low Na diet -Daily weights -BMET next week  #VT: Patient with recent hospitalization with VT as detailed above. Converted with IV amiodarone. She is wondering about ICD placement, however, I agree that the risk of device extraction with ICD placement is very high given her overall tenuous status. She is currently on amiodarone and following with EP. -Continue amiodarone  -Follow-up with EP as scheduled -Repeat BMET next week  #CHB s/p BiV PPM Placement: -Follows with Dr. TaLovena Le#OSA -Followed by Dr. NaEarly OsmondContinue BiPAP  #HTN: Elevated at home but given significant medication intolerances and previously well controlled BP on current regimen, will continue for now and monitor. -Continue coreg 6.2595mID  #DMII: -Did not tolerate jardiance, now on low dose ozempic and cannot  uptitrate due to significant GI distress  #HLD: -Continue pravastatin 80m8mily -Okay not to start crestor as she does not have significant CAD and she has so many medication intolerances that will avoid changing off a medication that she is currently tolerating well  #IBS: #Constipation: -Follow-up with GI as scheduled  Will need continued ongoing [place].        Follow-up:   F/u 6 weeka  Medication Adjustments/Labs and Tests Ordered: Current medicines are reviewed at length with the patient today.  Concerns regarding medicines are outlined above.   Orders Placed This Encounter  Procedures   Basic metabolic panel   Pro b natriuretic peptide   AMB referral to CHF clinic   Meds ordered this encounter  Medications   bumetanide (BUMEX) 1 MG tablet    Sig: Take 1 tablet (1 mg total) by mouth 2 (two) times daily.    Dispense:  180 tablet    Refill:  1    Dose increase- fill later for pt will use supply on hand first and call for further refills.   Patient Instructions  Medication Instructions:   INCREASE YOUR BUMEX TO 1 MG BY MOUTH TWICE DAILY  *If you need a refill on your cardiac medications before your next appointment, please call your pharmacy*   You have been referred to [facility]--PATIENT WAS PREVIOUSLY REFERRED TO CHF CLINIC BUT NOBODY CALLED HER TO ARRANGE THIS APPOINTMENT--PATIENT NEEDS  AN APPOINTMENT WITH THEM PER DR. Johney Frame     Lab Work:  IN ONE WEEK HERE IN THE OFFICE--BMET AND PRO-BNP  If you have labs (blood work) drawn today and your tests are completely normal, you will receive your results only by: [place] (if you have MyChart) OR A paper copy in the mail If you have any lab test that is abnormal or we need to change your treatment, we will call you to review the results.    Follow-Up:  6 WEEKS WITH AN EXTENDER IN THE OFFICE--PATIENT HAS BEEN PREVIOUSLY SEEN BY [name] DUNN PA-C, SO IDEALLY DR. Johney Frame  WOULD LIKE HER TO SEE THE PATIENT AND IF NO AVAILABILITY WITH HER, CAN SCHEDULE WITH AN APP   Signed, Freada Bergeron, MD  10/09/2022 11:38 AM    [facility]

## 2022-10-14 ENCOUNTER — Ambulatory Visit: Payer: 59 | Admitting: Cardiology

## 2022-10-15 DIAGNOSIS — Z79899 Other long term (current) drug therapy: Secondary | ICD-10-CM

## 2022-10-15 DIAGNOSIS — I5022 Chronic systolic (congestive) heart failure: Secondary | ICD-10-CM | POA: Diagnosis not present

## 2022-10-15 DIAGNOSIS — I5020 Unspecified systolic (congestive) heart failure: Secondary | ICD-10-CM

## 2022-10-16 ENCOUNTER — Other Ambulatory Visit: Payer: 59 | Admitting: Pharmacist

## 2022-10-16 DIAGNOSIS — I4891 Unspecified atrial fibrillation: Secondary | ICD-10-CM | POA: Diagnosis not present

## 2022-10-16 DIAGNOSIS — D649 Anemia, unspecified: Secondary | ICD-10-CM | POA: Diagnosis not present

## 2022-10-16 DIAGNOSIS — D0511 Intraductal carcinoma in situ of right breast: Secondary | ICD-10-CM | POA: Diagnosis not present

## 2022-10-16 NOTE — Progress Notes (Signed)
10/16/2022 Name: Tanya Hogan MRN: [id number] DOB: 06-04-1951  Chief Complaint  Patient presents with   Diabetes   Medication Assistance    Tanya Hogan is a 72 y.o. year old female who presented for a telephone visit.   They were referred to the pharmacist by their PCP for assistance in managing diabetes and medication access.   Patient expresses concern over constipation and requires senokot s, dulcolax, extra magnesium, miralax which is a burden for her quality of life.    Subjective:  Care Team: Primary Care Provider: Hali Marry, MD ; Next Scheduled Visit: 10/27/22   Medication Access/Adherence  Current Pharmacy:  [facility] [phone] - [place], [place] - [address] AT [place] [address] [place] [address] Phone: [phone] Fax: [phone]  [facility] ([place], [place] [facility] [place] [address] [address] Phone: [phone] Fax: [place] [id number] - [place], [place] [address] [place] [address] Phone: [phone] Fax: [phone]  [place] [place] [address] Phone: [phone] Fax: [phone]   Diabetes:  Current medications: ozempic 0.'25mg'$  weeky Medications tried in the past: jardiance (dehydration), metformin (diarrhea)  Current glucose readings: BG 200 (but that was pancakes and syrup), otherwise "high" for her is 129, usually 89-100s Using libre 2 meter; testing multiple times daily   Patient denies hypoglycemic s/sx including dizziness, shakiness, sweating. Patient denies hyperglycemic symptoms including polyuria, polydipsia, polyphagia, nocturia, neuropathy, blurred vision.   Current medication access support: ozempic via novonordisk   Objective:  Lab  Results  Component Value Date   HGBA1C 5.9 (H) 07/07/2022    Lab Results  Component Value Date   CREATININE 0.99 10/15/2022   BUN 23 10/15/2022   NA 139 10/15/2022   K 5.2 10/15/2022   CL 105 10/15/2022   CO2 21 10/15/2022    Lab Results  Component Value Date   CHOL 168 04/16/2021   HDL 73 04/16/2021   LDLCALC 74 04/16/2021   TRIG 120 04/16/2021   CHOLHDL 2.3 04/16/2021    Medications Reviewed Today     Reviewed by Darius Bump, RPH (Pharmacist) on 10/16/22 at [place] Status: <None>   Medication Order Taking? Sig Documenting Provider Last Dose Status Informant  acetaminophen (TYLENOL) 500 MG tablet [id number] Yes Take 500-1,000 mg by mouth every 6 (six) hours as needed (for pain.). [provider]  Active Self  albuterol (PROVENTIL) (2.5 MG/3ML) 0.083% nebulizer solution [id number] Yes Take 2.5 mg by nebulization every 6 (six) hours as needed for wheezing or shortness of breath. [provider]  Active Self  albuterol (VENTOLIN HFA) 108 (90 Base) MCG/ACT inhaler [id number] Yes Inhale 1-2 puffs into the lungs every 6 (six) hours as needed for wheezing or shortness of breath. Hali Marry, MD  Active Self  alendronate (FOSAMAX) 70 MG tablet [id number] Yes TAKE 1 TABLET BY MOUTH  EVERY 7 DAYS WITH A FULL  GLASS OF WATER ON AN EMPTY  STOMACH Hali Marry, MD  Active   AMBULATORY NON [place] [id number] Yes Medication Name: Tubing and supplies for nebulizer. Fax - [phone] Hali Marry, MD  Active Self  amiodarone (PACERONE) 200 MG tablet [id number] Yes TAKE  200 MG ONCE  A DAY MONDAY THRU THURSDAY  TAKE  200 MG TWICE A DAY FRIDAY THROUGH SUNDAY [place], [place]  Active   aspirin 81 MG chewable tablet [id number] Yes Chew by mouth daily. [provider]  Active   bumetanide (BUMEX) 1 MG tablet [id number] Yes Take 1 tablet (1 mg total) by mouth 2 (two) times daily. Freada Bergeron, MD  Active    carvedilol (COREG) 6.25 MG tablet [id number] Yes Take 1 tablet (6.25 mg total) by mouth in the morning and at bedtime. Charlie Pitter, PA-C  Active   clotrimazole-betamethasone (LOTRISONE) cream [id number] Yes 1 Application 2 (two) times daily. [provider]  Active   Continuous Blood Gluc Receiver (FREESTYLE LIBRE 2 READER) [place] [id number] Yes 6 (six) sensors/90 day supply. 4 refills per year. DX:E11.8 Hali Marry, MD  Active   Continuous Blood Gluc Sensor (FREESTYLE LIBRE 2 SENSOR) [place] [id number] Yes 1 Device by Does not apply route every 14 (fourteen) days. Hali Marry, MD  Active   doxycycline (VIBRA-TABS) 100 MG tablet [id number] Yes Take 100 mg by mouth 2 (two) times daily. [provider]  Active   fexofenadine (ALLEGRA) 180 MG tablet [id number] Yes Take 180 mg by mouth in the morning. [provider]  Active Self  fluticasone-salmeterol (ADVAIR HFA) [id number] MCG/ACT inhaler [id number] Yes Inhale 2 puffs into the lungs 2 (two) times daily. [provider]  Active Self  glucose blood test strip [id number] Yes To be used to check blood glucose Dx:E11.8 FreeStyle Precision blood glucose for Freestyle Libre 2 Hali Marry, MD  Active   levothyroxine (SYNTHROID) 88 MCG tablet [id number] Yes TAKE 1 TABLET(88 MCG) BY MOUTH DAILY Hali Marry, MD  Active   LINZESS 72 MCG capsule [id number] Yes Take 72-144 mcg by mouth 4 (four) times daily as needed (constipation.). [provider]  Active Self           Med Note Louretta Shorten, APRIL   Mon Oct 13, 2021 11:02 AM)    MAGNESIUM PO [id number] Yes Take 1 tablet by mouth daily. [provider]  Active   Multiple Vitamins-Minerals (OCUVITE EYE + MULTI) TABS [id number] Yes Take 1 tablet by mouth in the morning. [provider]  Active Self  Omega-3 Fatty Acids (FISH OIL) 1000 MG CAPS [id number] Yes Take by mouth. [provider]  Active   pantoprazole (PROTONIX) 40 MG  tablet [id number] Yes Take 1 tablet (40 mg total) by mouth daily before breakfast. Hali Marry, MD  Active   pravastatin (PRAVACHOL) 40 MG tablet [id number] Yes Take 1 tablet (40 mg total) by mouth every evening. Charlie Pitter, PA-C  Active   Semaglutide,0.25 or 0.'5MG'$ /DOS, (OZEMPIC, 0.25 OR 0.5 MG/DOSE,) 2 MG/1.5ML SOPN [id number] Yes Inject 0.25 mg into the skin every Saturday. [provider]  Active Self           Med Note Louretta Shorten, APRIL   Mon Oct 13, 2021 11:02 AM)    SODIUM FLUORIDE 5000 PLUS 1.1 % CREA dental cream [id number] Yes as directed. [provider]  Active   Tiotropium Bromide Monohydrate (SPIRIVA RESPIMAT) 1.25 MCG/ACT AERS [id number] Yes Inhale 2 puffs into the lungs daily. [provider]  Active Self  valACYclovir (VALTREX) 1000 MG tablet [id number] Yes Take 1 tablet (1,000 mg total) by mouth 2 (two) times daily.  Patient taking differently: Take 1,000 mg by mouth as needed (fever blisters/cold sores.).   Hali Marry, MD  Active Self  Assessment/Plan:   Diabetes: - Currently controlled, but patient expresses concerns with ozempic delaying gastric emptying and exacerbating her constipation - Recommend to hold ozempic for 2 weeks, see if improves consitpation. In meantime, patient wants to retrial metformin for glycemic control. Counseled patient on GI side effects, she verbalized understanding and is okay with possibility of GI side effects.  - Recommend to check glucose as currently     Follow Up Plan: 2-3 weeks  Larinda Buttery, PharmD Clinical Pharmacist [facility] [phone]

## 2022-10-17 LAB — PRO B NATRIURETIC PEPTIDE: NT-Pro BNP: 1693 pg/mL — ABNORMAL HIGH (ref 0–301)

## 2022-10-17 LAB — BASIC METABOLIC PANEL
BUN/Creatinine Ratio: 23 (ref 12–28)
BUN: 23 mg/dL (ref 8–27)
CO2: 21 mmol/L (ref 20–29)
Calcium: 10.1 mg/dL (ref 8.7–10.3)
Chloride: 105 mmol/L (ref 96–106)
Creatinine, Ser: 0.99 mg/dL (ref 0.57–1.00)
Glucose: 104 mg/dL — ABNORMAL HIGH (ref 70–99)
Potassium: 5.2 mmol/L (ref 3.5–5.2)
Sodium: 139 mmol/L (ref 134–144)
eGFR: 60 mL/min/{1.73_m2} (ref >59)

## 2022-10-19 ENCOUNTER — Telehealth: Payer: Self-pay | Admitting: *Deleted

## 2022-10-19 DIAGNOSIS — Z79899 Other long term (current) drug therapy: Secondary | ICD-10-CM

## 2022-10-19 DIAGNOSIS — I428 Other cardiomyopathies: Secondary | ICD-10-CM

## 2022-10-19 DIAGNOSIS — I5020 Unspecified systolic (congestive) heart failure: Secondary | ICD-10-CM

## 2022-10-19 DIAGNOSIS — I5022 Chronic systolic (congestive) heart failure: Secondary | ICD-10-CM

## 2022-10-19 MED ORDER — METFORMIN HCL ER 500 MG PO TB24: 500.0000 mg | ORAL_TABLET | Freq: Two times a day (BID) | ORAL | 1 refills | Status: DC

## 2022-10-19 NOTE — Progress Notes (Signed)
Agree with retrial of metformin.   Meds ordered this encounter  Medications   metFORMIN (GLUCOPHAGE-XR) 500 MG 24 hr tablet    Sig: Take 1 tablet (500 mg total) by mouth 2 (two) times daily with a meal.    Dispense:  180 tablet    Refill:  1

## 2022-10-19 NOTE — Telephone Encounter (Signed)
The patient has been notified of the result and verbalized understanding.  All questions (if any) were answered.  Pt aware to increase her Bumex starting today to taking 2 mg po in the AM, then take 1 mg po in the PM x 3 days only, then go back to her old regimen of taking 1 mg po BID thereafter.  Pt states she has plenty  Bumex on hand to get this amount.  She understands full dosing instructions.   She will come in for repeat BMET/PRO-BNP in one week on Monday 10/26/22.  Pt verbalized understanding and agrees with this plan.  Updated temporary dose increase of Bumex to the pts medication list.

## 2022-10-19 NOTE — Telephone Encounter (Signed)
-----   Message from Freada Bergeron, MD sent at 10/17/2022  4:20 PM EST ----- Her fluid levels are significantly elevated with BNP 1693. Is she feeling better on the bumex 396m BID? I think we should bump her to 259min the AM and 96m74mn the PM for 3 days and then go back to 96mg96mD thereafter. We can repeat BMET and BNP in 7 days to ensure things are improving.

## 2022-10-19 NOTE — Addendum Note (Signed)
Addended by: Beatrice Lecher D on: 10/19/2022 01:43 PM   Modules accepted: Orders

## 2022-10-20 ENCOUNTER — Other Ambulatory Visit: Payer: Self-pay | Admitting: Family Medicine

## 2022-10-20 ENCOUNTER — Ambulatory Visit: Payer: 59 | Admitting: Physician Assistant

## 2022-10-20 DIAGNOSIS — K219 Gastro-esophageal reflux disease without esophagitis: Secondary | ICD-10-CM

## 2022-10-21 ENCOUNTER — Telehealth: Payer: 59

## 2022-10-21 ENCOUNTER — Ambulatory Visit (INDEPENDENT_AMBULATORY_CARE_PROVIDER_SITE_OTHER): Payer: 59

## 2022-10-21 DIAGNOSIS — E118 Type 2 diabetes mellitus with unspecified complications: Secondary | ICD-10-CM

## 2022-10-21 DIAGNOSIS — I5022 Chronic systolic (congestive) heart failure: Secondary | ICD-10-CM

## 2022-10-21 NOTE — Patient Instructions (Signed)
Please call the care guide team at [phone] if you need to cancel or reschedule your appointment.   If you are experiencing a Mental Health or [place] or need someone to talk to, please call the Suicide and Crisis Lifeline: 988 call the [place] National Suicide Prevention Lifeline: 912-229-0161 or TTY: 505 661 2974 TTY (661) 203-4456) to talk to a trained counselor call 1-800-273-TALK (toll free, 24 hour hotline)   Following is a copy of the CCM Program Consent:  CCM service includes personalized support from designated clinical staff supervised by the physician, including individualized plan of care and coordination with other care providers 24/7 contact phone numbers for assistance for urgent and routine care needs. Service will only be billed when office clinical staff spend 20 minutes or more in a month to coordinate care. Only one practitioner may furnish and bill the service in a calendar month. The patient may stop CCM services at amy time (effective at the end of the month) by phone call to the office staff. The patient will be responsible for cost sharing (co-pay) or up to 20% of the service fee (after annual deductible is met)  Following is a copy of your full provider care plan:   Goals Addressed             This Visit's Progress    CCM Expected Outcome:  Monitor, Self-Manage and Reduce Symptoms of Diabetes       Current Barriers:  Knowledge Deficits related to constipation and having to change medications to see if constipation is resolved and blood sugar elevations after medication changes Chronic Disease Management support and education needs related to effective management of DM Lab Results  Component Value Date   HGBA1C 5.9 (H) 07/07/2022     Planned Interventions: Provided education to patient about basic DM disease process. The patient states her blood sugars have been bouncing around since she has not been taking her Ozempic. She is waiting for  the providers to tell her when she can start taking the [place] again ; Reviewed medications with patient and discussed importance of medication adherence. The patient states she has stopped taking Ozempic and her blood sugars are staying stable. Plans to talk to the provider about Ozempic;        Reviewed prescribed diet with patient heart healthy/ADA. Review and education provided; Counseled on importance of regular laboratory monitoring as prescribed. Has regular labwork;        Discussed plans with patient for ongoing care management follow up and provided patient with direct contact information for care management team;      Provided patient with written educational materials related to hypo and hyperglycemia and importance of correct treatment. Denies any issues with hypo or hyperglycemia;       Reviewed scheduled/upcoming provider appointments including: 10-27-2022;         Advised patient, providing education and rationale, to check cbg twice daily and when you have symptoms of low or high blood sugar and record. Currently the patient is checking her blood sugars and recording. She states that it has been 99 to 188. She states fasting it is 110 and 120. Today after lunch it was 188. Review of goal of fasting is <130 and post prandial of <180.      Referral made to pharmacy team for assistance with PAP for DM medications. The patient is working with the pharm D for medication management and support Review of patient status, including review of consultants reports, relevant laboratory and other  test results, and medications completed;       Advised patient to discuss changes in DM, questions, or concerns with provider;      Screening for signs and symptoms of depression related to chronic disease state;        Assessed social determinant of health barriers;  Encouraged the patient to write down question to take with her to her upcoming appointment next week.       Symptom Management: Take  medications as prescribed   Attend all scheduled provider appointments Call provider office for new concerns or questions  call the Suicide and Crisis Lifeline: 988 call the [place] National Suicide Prevention Lifeline: 631-597-8791 or TTY: 2254116313 TTY 331-328-5309) to talk to a trained counselor call 1-800-273-TALK (toll free, 24 hour hotline) if experiencing a Mental Health or [place]  check feet daily for cuts, sores or redness enter blood sugar readings and medication or insulin into daily log take the blood sugar log to all doctor visits trim toenails straight across manage portion size keep feet up while sitting wash and dry feet carefully every day wear comfortable, cotton socks wear comfortable, well-fitting shoes  Follow Up Plan: Telephone follow up appointment with care management team member scheduled for: 12-03-2022 at 345 pm       CCM Expected Outcome:  Monitor, Self-Manage and Reduce Symptoms of Heart Failure       Current Barriers:  Knowledge Deficits related to the biventricular pacing system and the generator changes and fully understanding the pacing system and how it works Chronic Disease Management support and education needs related to effective management of HF EF% 20-25 % in May of 2023, 08-29-2022 EF 25 to 30%  Pacemaker generator setting changed in May of 2023. During hospitalization they discussed her upgrading her pacemaker to an ICD- the patient is unsure if she wants to do this Post discharge from the hospital for SVT 08-28-2022 to 09-03-2022  Wt Readings from Last 3 Encounters:  10/09/22 217 lb 9.6 oz (98.7 kg)  09/25/22 214 lb (97.1 kg)  09/09/22 214 lb 12.8 oz (97.4 kg)     Planned Interventions: Basic overview and discussion of pathophysiology of Heart Failure reviewed. Patient states her dry weight is 208 and this is good for her. She is weighing daily and her weight today was 212. She has been taking her Bumex TID as instructed. The  patient states she does not have any swelling in her feet or legs but she feels she does have fluid present. She knows her BNP is elevated. Education provided on what the BNP level was. They mentioned increasing her Coreg to 12.5 mg but she says that she cannot drive and she is in a fog like state when she takes the higher dose of Coreg. Encouraged her to discuss this with her provider.  Provided education on low sodium diet/ADA. Education and review. The patient is compliant with dietary restrictions. She has had issues in the past with hyperkalemia and she is mindful of this. Education on monitoring sodium content of foods closely Reviewed Heart Failure Action Plan in depth and provided written copy. The patient has a plan to when to take fluid pills TID Assessed need for readable accurate scales in home. Post hospitalization she is weight TID. She has had changes in her medications and has her medications.  Provided education about placing scale on hard, flat surface Advised patient to weigh each morning after emptying bladder Discussed importance of daily weight and advised patient to  weigh and record daily. The patient is compliant with weighing at this time. She says her dry weight is 208 and this is a good place for her but today her weight is 212. She feels she has more fluid on board. She has to sleep with 3 pillows and she easily gets short of breath but states her oxygen stays up.  Reviewed role of diuretics in prevention of fluid overload and management of heart failure. The patient has parameters and is taking her Bumex as instructed. She states she had been very frustrated because the providers were not listening to her but she says that is better now. She was emotional and states she had a "hissy fit" and cried when they told her she was in the end stages of her heart failure but she is over that now. She wants to do what she can to make sure she takes care of herself. Reflective listening and  support given.  Discussed the importance of keeping all appointments with provider. 10-27-2022 with the pcp, reminder given today. Sees cardiologist regularly.  Provided patient with education about the role of exercise in the management of heart failure. The patient had been doing aerobics 5 days a week. She has not been able to return to doing her exercises. This is something she really wants to do because she felt better when she did this. She states that her goal is to get back to exercising. She knows she cannot do several things in one day. Review of pacing activity and not overdoing things.  Advised patient to discuss changes in HF or other  with provider Screening for signs and symptoms of depression related to chronic disease state  Assessed social determinant of health barriers  Symptom Management: Take medications as prescribed   Attend all scheduled provider appointments Call provider office for new concerns or questions  call the Suicide and Crisis Lifeline: 988 call the [place] National Suicide Prevention Lifeline: (604)526-9479 or TTY: 930-223-0423 TTY 610-252-9140) to talk to a trained counselor call 1-800-273-TALK (toll free, 24 hour hotline) if experiencing a Mental Health or [place]  call office if I gain more than 2 pounds in one day or 5 pounds in one week keep legs up while sitting track weight in diary use salt in moderation watch for swelling in feet, ankles and legs every day weigh myself daily begin a heart failure diary bring diary to all appointments develop a rescue plan follow rescue plan if symptoms flare-up track symptoms and what helps feel better or worse dress right for the weather, hot or cold  Follow Up Plan: Telephone follow up appointment with care management team member scheduled for: 12-03-2022 at 345 pm          Patient verbalizes understanding of instructions and care plan provided today and agrees to view in [place]. Active  MyChart status and patient understanding of how to access instructions and care plan via MyChart confirmed with patient.     Telephone follow up appointment with care management team member scheduled for:  12-03-2022 at 345 pm

## 2022-10-21 NOTE — Chronic Care Management (AMB) (Signed)
Chronic Care Management   CCM RN Visit Note  10/21/2022 Name: Tanya Hogan MRN: [id number] DOB: 1951-05-28  Subjective: Tanya Hogan is a 72 y.o. year old female who is a primary care patient of [name], Rene Kocher, MD. The patient was referred to the Chronic Care Management team for assistance with care management needs subsequent to provider initiation of CCM services and plan of care.    Today's Visit:  Engaged with patient by telephone for follow up visit.        Goals Addressed             This Visit's Progress    CCM Expected Outcome:  Monitor, Self-Manage and Reduce Symptoms of Diabetes       Current Barriers:  Knowledge Deficits related to constipation and having to change medications to see if constipation is resolved and blood sugar elevations after medication changes Chronic Disease Management support and education needs related to effective management of DM Lab Results  Component Value Date   HGBA1C 5.9 (H) 07/07/2022     Planned Interventions: Provided education to patient about basic DM disease process. The patient states her blood sugars have been bouncing around since she has not been taking her Ozempic. She is waiting for the providers to tell her when she can start taking the [place] again ; Reviewed medications with patient and discussed importance of medication adherence. The patient states she has stopped taking Ozempic and her blood sugars are staying stable. Plans to talk to the provider about Ozempic;        Reviewed prescribed diet with patient heart healthy/ADA. Review and education provided; Counseled on importance of regular laboratory monitoring as prescribed. Has regular labwork;        Discussed plans with patient for ongoing care management follow up and provided patient with direct contact information for care management team;      Provided patient with written educational materials related to hypo and hyperglycemia and importance of correct  treatment. Denies any issues with hypo or hyperglycemia;       Reviewed scheduled/upcoming provider appointments including: 10-27-2022;         Advised patient, providing education and rationale, to check cbg twice daily and when you have symptoms of low or high blood sugar and record. Currently the patient is checking her blood sugars and recording. She states that it has been 99 to 188. She states fasting it is 110 and 120. Today after lunch it was 188. Review of goal of fasting is <130 and post prandial of <180.      Referral made to pharmacy team for assistance with PAP for DM medications. The patient is working with the pharm D for medication management and support Review of patient status, including review of consultants reports, relevant laboratory and other test results, and medications completed;       Advised patient to discuss changes in DM, questions, or concerns with provider;      Screening for signs and symptoms of depression related to chronic disease state;        Assessed social determinant of health barriers;  Encouraged the patient to write down question to take with her to her upcoming appointment next week.       Symptom Management: Take medications as prescribed   Attend all scheduled provider appointments Call provider office for new concerns or questions  call the Suicide and Crisis Lifeline: 988 call the [place] National Suicide Prevention Lifeline: 613-544-9523 or TTY: 408 075 0833 TTY (  706-569-3312) to talk to a trained counselor call 1-800-273-TALK (toll free, 24 hour hotline) if experiencing a Mental Health or [place]  check feet daily for cuts, sores or redness enter blood sugar readings and medication or insulin into daily log take the blood sugar log to all doctor visits trim toenails straight across manage portion size keep feet up while sitting wash and dry feet carefully every day wear comfortable, cotton socks wear comfortable, well-fitting  shoes  Follow Up Plan: Telephone follow up appointment with care management team member scheduled for: 12-03-2022 at 345 pm       CCM Expected Outcome:  Monitor, Self-Manage and Reduce Symptoms of Heart Failure       Current Barriers:  Knowledge Deficits related to the biventricular pacing system and the generator changes and fully understanding the pacing system and how it works Chronic Disease Management support and education needs related to effective management of HF EF% 20-25 % in May of 2023, 08-29-2022 EF 25 to 30%  Pacemaker generator setting changed in May of 2023. During hospitalization they discussed her upgrading her pacemaker to an ICD- the patient is unsure if she wants to do this Post discharge from the hospital for SVT 08-28-2022 to 09-03-2022  Wt Readings from Last 3 Encounters:  10/09/22 217 lb 9.6 oz (98.7 kg)  09/25/22 214 lb (97.1 kg)  09/09/22 214 lb 12.8 oz (97.4 kg)     Planned Interventions: Basic overview and discussion of pathophysiology of Heart Failure reviewed. Patient states her dry weight is 208 and this is good for her. She is weighing daily and her weight today was 212. She has been taking her Bumex TID as instructed. The patient states she does not have any swelling in her feet or legs but she feels she does have fluid present. She knows her BNP is elevated. Education provided on what the BNP level was. They mentioned increasing her Coreg to 12.5 mg but she says that she cannot drive and she is in a fog like state when she takes the higher dose of Coreg. Encouraged her to discuss this with her provider.  Provided education on low sodium diet/ADA. Education and review. The patient is compliant with dietary restrictions. She has had issues in the past with hyperkalemia and she is mindful of this. Education on monitoring sodium content of foods closely Reviewed Heart Failure Action Plan in depth and provided written copy. The patient has a plan to when to take fluid  pills TID Assessed need for readable accurate scales in home. Post hospitalization she is weight TID. She has had changes in her medications and has her medications.  Provided education about placing scale on hard, flat surface Advised patient to weigh each morning after emptying bladder Discussed importance of daily weight and advised patient to weigh and record daily. The patient is compliant with weighing at this time. She says her dry weight is 208 and this is a good place for her but today her weight is 212. She feels she has more fluid on board. She has to sleep with 3 pillows and she easily gets short of breath but states her oxygen stays up.  Reviewed role of diuretics in prevention of fluid overload and management of heart failure. The patient has parameters and is taking her Bumex as instructed. She states she had been very frustrated because the providers were not listening to her but she says that is better now. She was emotional and states she had a "  hissy fit" and cried when they told her she was in the end stages of her heart failure but she is over that now. She wants to do what she can to make sure she takes care of herself. Reflective listening and support given.  Discussed the importance of keeping all appointments with provider. 10-27-2022 with the pcp, reminder given today. Sees cardiologist regularly.  Provided patient with education about the role of exercise in the management of heart failure. The patient had been doing aerobics 5 days a week. She has not been able to return to doing her exercises. This is something she really wants to do because she felt better when she did this. She states that her goal is to get back to exercising. She knows she cannot do several things in one day. Review of pacing activity and not overdoing things.  Advised patient to discuss changes in HF or other  with provider Screening for signs and symptoms of depression related to chronic disease state   Assessed social determinant of health barriers  Symptom Management: Take medications as prescribed   Attend all scheduled provider appointments Call provider office for new concerns or questions  call the Suicide and Crisis Lifeline: 988 call the [place] National Suicide Prevention Lifeline: 831-218-8074 or TTY: 214-695-4295 TTY (518) 700-8747) to talk to a trained counselor call 1-800-273-TALK (toll free, 24 hour hotline) if experiencing a Mental Health or [place]  call office if I gain more than 2 pounds in one day or 5 pounds in one week keep legs up while sitting track weight in diary use salt in moderation watch for swelling in feet, ankles and legs every day weigh myself daily begin a heart failure diary bring diary to all appointments develop a rescue plan follow rescue plan if symptoms flare-up track symptoms and what helps feel better or worse dress right for the weather, hot or cold  Follow Up Plan: Telephone follow up appointment with care management team member scheduled for: 12-03-2022 at 345 pm          Plan:Telephone follow up appointment with care management team member scheduled for:  12-03-2022 at 345 pm  [place], MSN, CCM RN Care Manager  Chronic Care Management Direct Number: [phone]

## 2022-10-22 DIAGNOSIS — E1159 Type 2 diabetes mellitus with other circulatory complications: Secondary | ICD-10-CM | POA: Diagnosis not present

## 2022-10-22 DIAGNOSIS — I5020 Unspecified systolic (congestive) heart failure: Secondary | ICD-10-CM | POA: Diagnosis not present

## 2022-10-26 ENCOUNTER — Encounter: Payer: 59 | Attending: Cardiology

## 2022-10-26 DIAGNOSIS — I5020 Unspecified systolic (congestive) heart failure: Secondary | ICD-10-CM | POA: Insufficient documentation

## 2022-10-26 DIAGNOSIS — Z79899 Other long term (current) drug therapy: Secondary | ICD-10-CM | POA: Insufficient documentation

## 2022-10-26 DIAGNOSIS — I428 Other cardiomyopathies: Secondary | ICD-10-CM | POA: Diagnosis not present

## 2022-10-26 DIAGNOSIS — I5022 Chronic systolic (congestive) heart failure: Secondary | ICD-10-CM | POA: Insufficient documentation

## 2022-10-27 ENCOUNTER — Encounter: Payer: Self-pay | Admitting: Family Medicine

## 2022-10-27 ENCOUNTER — Ambulatory Visit (INDEPENDENT_AMBULATORY_CARE_PROVIDER_SITE_OTHER): Payer: 59 | Admitting: Family Medicine

## 2022-10-27 VITALS — BP 126/57 | HR 69 | Resp 20 | Ht 61.0 in | Wt 215.1 lb

## 2022-10-27 DIAGNOSIS — I5022 Chronic systolic (congestive) heart failure: Secondary | ICD-10-CM

## 2022-10-27 DIAGNOSIS — G4733 Obstructive sleep apnea (adult) (pediatric): Secondary | ICD-10-CM | POA: Diagnosis not present

## 2022-10-27 DIAGNOSIS — E118 Type 2 diabetes mellitus with unspecified complications: Secondary | ICD-10-CM

## 2022-10-27 DIAGNOSIS — I700 Atherosclerosis of aorta: Secondary | ICD-10-CM | POA: Diagnosis not present

## 2022-10-27 DIAGNOSIS — F4329 Adjustment disorder with other symptoms: Secondary | ICD-10-CM

## 2022-10-27 LAB — POCT GLYCOSYLATED HEMOGLOBIN (HGB A1C): Hemoglobin A1C: 6.2 % — AB (ref 4.0–5.6)

## 2022-10-27 NOTE — Assessment & Plan Note (Signed)
No sign of volume overload.  Intolerant to ACE and ARB's she is on carvedilol and Pacerone.

## 2022-10-27 NOTE — Assessment & Plan Note (Signed)
Blood pressure looks great today.  Continue current regimen.

## 2022-10-27 NOTE — Assessment & Plan Note (Signed)
BiPAP is working well again.  Continue with current regimen.

## 2022-10-27 NOTE — Progress Notes (Signed)
Established Patient Office Visit  Subjective   Patient ID: Tanya Hogan, female    DOB: Dec 23, 1950  Age: 72 y.o. MRN: [id number]  Chief Complaint  Patient presents with   Follow-up   Diabetes    HPI Diabetes - no hypoglycemic events. No wounds or sores that are not healing well. No increased thirst or urination. Checking glucose at home. Taking medications as prescribed without any side effects. She would like to restart Ozempic.  She did have a hypoglycemic event this morning blood sugar was 64.  But then this afternoon after lunch it was 240.  She is not seeing a lot of low blood sugars.  She did bring in her home glucometer.  7-day average was 128, 14-day average of 127, 30-day average of 115 and 90-day average of 120.  Lab Results  Component Value Date   HGBA1C 6.2 (A) 10/27/2022   She was able to get a new SIM card put in her BiPAP machine so now it is working again she has been using it regularly.  Since she had an episode of A-fib on Saturday after she got really upset at her brother.  It took her a while to feel like she could finally calm down.  She is still taking the Pacerone as well as an aspirin daily.    ROS    Objective:     BP (!) 126/57   Pulse 69   Resp 20   Ht '5\' 1"'$  (1.549 m)   Wt 215 lb 1.9 oz (97.6 kg)   SpO2 98%   BMI 40.65 kg/m    Physical Exam Vitals and nursing note reviewed.  Constitutional:      Appearance: She is well-developed.  HENT:     Head: Normocephalic and atraumatic.  Cardiovascular:     Rate and Rhythm: Normal rate and regular rhythm.     Heart sounds: Normal heart sounds.  Pulmonary:     Effort: Pulmonary effort is normal.     Breath sounds: Normal breath sounds.  Skin:    General: Skin is warm and dry.  Neurological:     Mental Status: She is alert and oriented to person, place, and time.  Psychiatric:        Behavior: Behavior normal.      Results for orders placed or performed in visit on 10/27/22  POCT HgB  A1C  Result Value Ref Range   Hemoglobin A1C 6.2 (A) 4.0 - 5.6 %   HbA1c POC (<> result, manual entry)     HbA1c, POC (prediabetic range)     HbA1c, POC (controlled diabetic range)        The ASCVD Risk score (Arnett DK, et al., 2019) failed to calculate for the following reasons:   The patient has a prior MI or stroke diagnosis    Assessment & Plan:   Problem List Items Addressed This Visit       Cardiovascular and Mediastinum   Chronic systolic heart failure ([place])    No sign of volume overload.  Intolerant to ACE and ARB's she is on carvedilol and Pacerone.      Aortic atherosclerosis (HCC)    Conitinue daily pravastatin.          Respiratory   OSA (obstructive sleep apnea)    BiPAP is working well again.  Continue with current regimen.        Endocrine   Controlled diabetes mellitus type 2 with complications (HCC) - Primary    A1c  looks great today at 6.2.  90, 30 and 14-day average is all look fantastic as well.  Continue current regimen with metformin.  Will hold off on restarting Ozempic.      Relevant Orders   POCT HgB A1C (Completed)   POCT UA - Microalbumin     Other   Stress and adjustment reaction    Unfortunately she lives with her brother and it is quite stressful and negatively impacts her health at times.  Her daughter has actually been encouraging her to move out but most places are too expensive for her to really afford it.       Return in about 16 weeks (around 02/16/2023) for Diabetes follow-up.    Beatrice Lecher, MD

## 2022-10-27 NOTE — Assessment & Plan Note (Signed)
Unfortunately she lives with her brother and it is quite stressful and negatively impacts her health at times.  Her daughter has actually been encouraging her to move out but most places are too expensive for her to really afford it.

## 2022-10-27 NOTE — Assessment & Plan Note (Signed)
A1c looks great today at 6.2.  90, 30 and 14-day average is all look fantastic as well.  Continue current regimen with metformin.  Will hold off on restarting Ozempic.

## 2022-10-27 NOTE — Assessment & Plan Note (Signed)
Conitinue daily pravastatin.

## 2022-10-28 DIAGNOSIS — K219 Gastro-esophageal reflux disease without esophagitis: Secondary | ICD-10-CM | POA: Diagnosis not present

## 2022-10-28 DIAGNOSIS — K581 Irritable bowel syndrome with constipation: Secondary | ICD-10-CM | POA: Diagnosis not present

## 2022-10-28 DIAGNOSIS — K7401 Hepatic fibrosis, early fibrosis: Secondary | ICD-10-CM | POA: Diagnosis not present

## 2022-10-28 LAB — PRO B NATRIURETIC PEPTIDE: NT-Pro BNP: 1312 pg/mL — ABNORMAL HIGH (ref 0–301)

## 2022-10-28 LAB — BASIC METABOLIC PANEL
BUN/Creatinine Ratio: 18 (ref 12–28)
BUN: 17 mg/dL (ref 8–27)
CO2: 26 mmol/L (ref 20–29)
Calcium: 10.6 mg/dL — ABNORMAL HIGH (ref 8.7–10.3)
Chloride: 101 mmol/L (ref 96–106)
Creatinine, Ser: 0.93 mg/dL (ref 0.57–1.00)
Glucose: 104 mg/dL — ABNORMAL HIGH (ref 70–99)
Potassium: 4.8 mmol/L (ref 3.5–5.2)
Sodium: 139 mmol/L (ref 134–144)
eGFR: 65 mL/min/{1.73_m2} (ref >59)

## 2022-10-29 ENCOUNTER — Inpatient Hospital Stay (HOSPITAL_BASED_OUTPATIENT_CLINIC_OR_DEPARTMENT_OTHER)
Admission: EM | Admit: 2022-10-29 | Discharge: 2022-11-01 | DRG: 291 | Disposition: A | Discharge status: Home / Self Care | Payer: 59 | Attending: Internal Medicine | Admitting: Internal Medicine

## 2022-10-29 ENCOUNTER — Encounter (HOSPITAL_BASED_OUTPATIENT_CLINIC_OR_DEPARTMENT_OTHER): Payer: Self-pay | Admitting: Pediatrics

## 2022-10-29 ENCOUNTER — Telehealth: Payer: Self-pay

## 2022-10-29 ENCOUNTER — Other Ambulatory Visit: Payer: Self-pay

## 2022-10-29 ENCOUNTER — Emergency Department (HOSPITAL_BASED_OUTPATIENT_CLINIC_OR_DEPARTMENT_OTHER): Payer: 59

## 2022-10-29 DIAGNOSIS — I428 Other cardiomyopathies: Secondary | ICD-10-CM | POA: Diagnosis present

## 2022-10-29 DIAGNOSIS — I110 Hypertensive heart disease with heart failure: Principal | ICD-10-CM | POA: Diagnosis present

## 2022-10-29 DIAGNOSIS — I459 Conduction disorder, unspecified: Secondary | ICD-10-CM | POA: Diagnosis present

## 2022-10-29 DIAGNOSIS — K219 Gastro-esophageal reflux disease without esophagitis: Secondary | ICD-10-CM | POA: Diagnosis present

## 2022-10-29 DIAGNOSIS — Z6841 Body Mass Index (BMI) 40.0 and over, adult: Secondary | ICD-10-CM | POA: Diagnosis not present

## 2022-10-29 DIAGNOSIS — I5043 Acute on chronic combined systolic (congestive) and diastolic (congestive) heart failure: Secondary | ICD-10-CM | POA: Diagnosis present

## 2022-10-29 DIAGNOSIS — E785 Hyperlipidemia, unspecified: Secondary | ICD-10-CM | POA: Diagnosis present

## 2022-10-29 DIAGNOSIS — Z885 Allergy status to narcotic agent status: Secondary | ICD-10-CM

## 2022-10-29 DIAGNOSIS — M797 Fibromyalgia: Secondary | ICD-10-CM | POA: Diagnosis not present

## 2022-10-29 DIAGNOSIS — Z79890 Hormone replacement therapy (postmenopausal): Secondary | ICD-10-CM | POA: Diagnosis not present

## 2022-10-29 DIAGNOSIS — E1151 Type 2 diabetes mellitus with diabetic peripheral angiopathy without gangrene: Secondary | ICD-10-CM | POA: Diagnosis not present

## 2022-10-29 DIAGNOSIS — Z9989 Dependence on other enabling machines and devices: Secondary | ICD-10-CM | POA: Diagnosis not present

## 2022-10-29 DIAGNOSIS — I272 Pulmonary hypertension, unspecified: Secondary | ICD-10-CM | POA: Diagnosis present

## 2022-10-29 DIAGNOSIS — I472 Ventricular tachycardia, unspecified: Secondary | ICD-10-CM | POA: Diagnosis present

## 2022-10-29 DIAGNOSIS — I5020 Unspecified systolic (congestive) heart failure: Secondary | ICD-10-CM

## 2022-10-29 DIAGNOSIS — I5021 Acute systolic (congestive) heart failure: Secondary | ICD-10-CM | POA: Diagnosis not present

## 2022-10-29 DIAGNOSIS — Z882 Allergy status to sulfonamides status: Secondary | ICD-10-CM

## 2022-10-29 DIAGNOSIS — I509 Heart failure, unspecified: Principal | ICD-10-CM

## 2022-10-29 DIAGNOSIS — Z853 Personal history of malignant neoplasm of breast: Secondary | ICD-10-CM

## 2022-10-29 DIAGNOSIS — Z9011 Acquired absence of right breast and nipple: Secondary | ICD-10-CM

## 2022-10-29 DIAGNOSIS — Z820 Family history of epilepsy and other diseases of the nervous system: Secondary | ICD-10-CM

## 2022-10-29 DIAGNOSIS — E875 Hyperkalemia: Secondary | ICD-10-CM | POA: Diagnosis not present

## 2022-10-29 DIAGNOSIS — I5023 Acute on chronic systolic (congestive) heart failure: Secondary | ICD-10-CM | POA: Diagnosis not present

## 2022-10-29 DIAGNOSIS — E66813 Obesity, class 3: Secondary | ICD-10-CM | POA: Diagnosis present

## 2022-10-29 DIAGNOSIS — Z9109 Other allergy status, other than to drugs and biological substances: Secondary | ICD-10-CM

## 2022-10-29 DIAGNOSIS — Z87442 Personal history of urinary calculi: Secondary | ICD-10-CM

## 2022-10-29 DIAGNOSIS — I340 Nonrheumatic mitral (valve) insufficiency: Secondary | ICD-10-CM | POA: Diagnosis present

## 2022-10-29 DIAGNOSIS — I4729 Other ventricular tachycardia: Secondary | ICD-10-CM | POA: Diagnosis present

## 2022-10-29 DIAGNOSIS — G4733 Obstructive sleep apnea (adult) (pediatric): Secondary | ICD-10-CM | POA: Diagnosis not present

## 2022-10-29 DIAGNOSIS — Z96611 Presence of right artificial shoulder joint: Secondary | ICD-10-CM | POA: Diagnosis present

## 2022-10-29 DIAGNOSIS — Z8249 Family history of ischemic heart disease and other diseases of the circulatory system: Secondary | ICD-10-CM

## 2022-10-29 DIAGNOSIS — E1169 Type 2 diabetes mellitus with other specified complication: Secondary | ICD-10-CM | POA: Diagnosis present

## 2022-10-29 DIAGNOSIS — E038 Other specified hypothyroidism: Secondary | ICD-10-CM | POA: Diagnosis not present

## 2022-10-29 DIAGNOSIS — Z95810 Presence of automatic (implantable) cardiac defibrillator: Secondary | ICD-10-CM

## 2022-10-29 DIAGNOSIS — Z803 Family history of malignant neoplasm of breast: Secondary | ICD-10-CM

## 2022-10-29 DIAGNOSIS — I10 Essential (primary) hypertension: Secondary | ICD-10-CM | POA: Diagnosis not present

## 2022-10-29 DIAGNOSIS — J45909 Unspecified asthma, uncomplicated: Secondary | ICD-10-CM | POA: Diagnosis present

## 2022-10-29 DIAGNOSIS — Z8701 Personal history of pneumonia (recurrent): Secondary | ICD-10-CM

## 2022-10-29 DIAGNOSIS — R0602 Shortness of breath: Secondary | ICD-10-CM | POA: Diagnosis not present

## 2022-10-29 DIAGNOSIS — Z881 Allergy status to other antibiotic agents status: Secondary | ICD-10-CM

## 2022-10-29 DIAGNOSIS — R682 Dry mouth, unspecified: Secondary | ICD-10-CM | POA: Diagnosis not present

## 2022-10-29 DIAGNOSIS — K7460 Unspecified cirrhosis of liver: Secondary | ICD-10-CM | POA: Diagnosis present

## 2022-10-29 DIAGNOSIS — I5022 Chronic systolic (congestive) heart failure: Secondary | ICD-10-CM

## 2022-10-29 DIAGNOSIS — Z7982 Long term (current) use of aspirin: Secondary | ICD-10-CM

## 2022-10-29 DIAGNOSIS — Z808 Family history of malignant neoplasm of other organs or systems: Secondary | ICD-10-CM

## 2022-10-29 DIAGNOSIS — E039 Hypothyroidism, unspecified: Secondary | ICD-10-CM | POA: Diagnosis present

## 2022-10-29 DIAGNOSIS — Z888 Allergy status to other drugs, medicaments and biological substances status: Secondary | ICD-10-CM

## 2022-10-29 DIAGNOSIS — I4821 Permanent atrial fibrillation: Secondary | ICD-10-CM | POA: Diagnosis not present

## 2022-10-29 DIAGNOSIS — Z880 Allergy status to penicillin: Secondary | ICD-10-CM

## 2022-10-29 DIAGNOSIS — Z7951 Long term (current) use of inhaled steroids: Secondary | ICD-10-CM

## 2022-10-29 DIAGNOSIS — E7800 Pure hypercholesterolemia, unspecified: Secondary | ICD-10-CM | POA: Diagnosis not present

## 2022-10-29 DIAGNOSIS — Z7983 Long term (current) use of bisphosphonates: Secondary | ICD-10-CM

## 2022-10-29 DIAGNOSIS — Z87891 Personal history of nicotine dependence: Secondary | ICD-10-CM

## 2022-10-29 DIAGNOSIS — Z79899 Other long term (current) drug therapy: Secondary | ICD-10-CM | POA: Diagnosis not present

## 2022-10-29 LAB — CBC WITH DIFFERENTIAL/PLATELET
Abs Immature Granulocytes: 0.04 10*3/uL (ref 0.00–0.07)
Basophils Absolute: 0.1 10*3/uL (ref 0.0–0.1)
Basophils Relative: 1 %
Eosinophils Absolute: 0.1 10*3/uL (ref 0.0–0.5)
Eosinophils Relative: 2 %
HCT: 40.7 % (ref 36.0–46.0)
Hemoglobin: 13.2 g/dL (ref 12.0–15.0)
Immature Granulocytes: 1 %
Lymphocytes Relative: 27 %
Lymphs Abs: 1.9 10*3/uL (ref 0.7–4.0)
MCH: 31.2 pg (ref 26.0–34.0)
MCHC: 32.4 g/dL (ref 30.0–36.0)
MCV: 96.2 fL (ref 80.0–100.0)
Monocytes Absolute: 0.6 10*3/uL (ref 0.1–1.0)
Monocytes Relative: 9 %
Neutro Abs: 4.3 10*3/uL (ref 1.7–7.7)
Neutrophils Relative %: 60 %
Platelets: 238 10*3/uL (ref 150–400)
RBC: 4.23 MIL/uL (ref 3.87–5.11)
RDW: 13.5 % (ref 11.5–15.5)
WBC: 7 10*3/uL (ref 4.0–10.5)
nRBC: 0 % (ref 0.0–0.2)

## 2022-10-29 LAB — COMPREHENSIVE METABOLIC PANEL
ALT: 25 U/L (ref 0–44)
AST: 28 U/L (ref 15–41)
Albumin: 3.6 g/dL (ref 3.5–5.0)
Alkaline Phosphatase: 102 U/L (ref 38–126)
Anion gap: 7 (ref 5–15)
BUN: 21 mg/dL (ref 8–23)
CO2: 24 mmol/L (ref 22–32)
Calcium: 9.2 mg/dL (ref 8.9–10.3)
Chloride: 105 mmol/L (ref 98–111)
Creatinine, Ser: 1.15 mg/dL — ABNORMAL HIGH (ref 0.44–1.00)
GFR, Estimated: 50 mL/min — ABNORMAL LOW (ref >60)
Glucose, Bld: 120 mg/dL — ABNORMAL HIGH (ref 70–99)
Potassium: 4.1 mmol/L (ref 3.5–5.1)
Sodium: 136 mmol/L (ref 135–145)
Total Bilirubin: 0.5 mg/dL (ref 0.3–1.2)
Total Protein: 6.9 g/dL (ref 6.5–8.1)

## 2022-10-29 LAB — BRAIN NATRIURETIC PEPTIDE: B Natriuretic Peptide: 535.5 pg/mL — ABNORMAL HIGH (ref 0.0–100.0)

## 2022-10-29 LAB — TROPONIN I (HIGH SENSITIVITY): Troponin I (High Sensitivity): 19 ng/L — ABNORMAL HIGH (ref <18)

## 2022-10-29 MED ORDER — POLYETHYLENE GLYCOL 3350 17 G PO PACK: 17.0000 g | PACK | Freq: Every day | ORAL | Status: DC | PRN

## 2022-10-29 MED ORDER — BUMETANIDE 1 MG PO TABS
1.0000 mg | ORAL_TABLET | Freq: Two times a day (BID) | ORAL | Status: DC
  Administered 2022-10-30: 1 mg via ORAL
  Filled 2022-10-29 (×2): qty 1

## 2022-10-29 MED ORDER — PANTOPRAZOLE SODIUM 40 MG PO TBEC
40.0000 mg | DELAYED_RELEASE_TABLET | Freq: Every day | ORAL | Status: DC
  Administered 2022-10-30 – 2022-11-01 (×3): 40 mg via ORAL
  Filled 2022-10-29 (×3): qty 1

## 2022-10-29 MED ORDER — CARVEDILOL 6.25 MG PO TABS: 6.2500 mg | ORAL_TABLET | Freq: Two times a day (BID) | ORAL | Status: DC

## 2022-10-29 MED ORDER — LEVOTHYROXINE SODIUM 88 MCG PO TABS
88.0000 ug | ORAL_TABLET | Freq: Every day | ORAL | Status: DC
  Administered 2022-10-30 – 2022-11-01 (×3): 88 ug via ORAL
  Filled 2022-10-29 (×3): qty 1

## 2022-10-29 MED ORDER — UMECLIDINIUM BROMIDE 62.5 MCG/ACT IN AEPB
1.0000 | INHALATION_SPRAY | Freq: Every day | RESPIRATORY_TRACT | Status: DC
  Administered 2022-10-30 – 2022-11-01 (×3): 1 via RESPIRATORY_TRACT
  Filled 2022-10-29: qty 7

## 2022-10-29 MED ORDER — BUMETANIDE 0.25 MG/ML IJ SOLN
1.0000 mg | Freq: Once | INTRAMUSCULAR | Status: DC
  Filled 2022-10-29: qty 4

## 2022-10-29 MED ORDER — ENOXAPARIN SODIUM 40 MG/0.4ML IJ SOSY
40.0000 mg | PREFILLED_SYRINGE | Freq: Every day | INTRAMUSCULAR | Status: DC
  Administered 2022-10-30 – 2022-11-01 (×3): 40 mg via SUBCUTANEOUS
  Filled 2022-10-29 (×3): qty 0.4

## 2022-10-29 MED ORDER — PRAVASTATIN SODIUM 40 MG PO TABS
40.0000 mg | ORAL_TABLET | Freq: Every evening | ORAL | Status: DC
  Administered 2022-10-30 – 2022-10-31 (×2): 40 mg via ORAL
  Filled 2022-10-29 (×2): qty 1

## 2022-10-29 MED ORDER — ASPIRIN 81 MG PO CHEW
81.0000 mg | CHEWABLE_TABLET | Freq: Every day | ORAL | Status: DC
  Administered 2022-10-30 – 2022-11-01 (×3): 81 mg via ORAL
  Filled 2022-10-29 (×3): qty 1

## 2022-10-29 MED ORDER — PROCHLORPERAZINE EDISYLATE 10 MG/2ML IJ SOLN: 5.0000 mg | Freq: Four times a day (QID) | INTRAMUSCULAR | Status: DC | PRN

## 2022-10-29 MED ORDER — MELATONIN 5 MG PO TABS
5.0000 mg | ORAL_TABLET | Freq: Every evening | ORAL | Status: DC | PRN
  Administered 2022-10-29: 5 mg via ORAL
  Filled 2022-10-29 (×2): qty 1

## 2022-10-29 MED ORDER — CARVEDILOL 3.125 MG PO TABS
3.1250 mg | ORAL_TABLET | Freq: Two times a day (BID) | ORAL | Status: DC
  Administered 2022-10-29 – 2022-11-01 (×6): 3.125 mg via ORAL
  Filled 2022-10-29 (×6): qty 1

## 2022-10-29 MED ORDER — ACETAMINOPHEN 325 MG PO TABS: 650.0000 mg | ORAL_TABLET | Freq: Four times a day (QID) | ORAL | Status: DC | PRN

## 2022-10-29 NOTE — ED Notes (Addendum)
Pt ambulated to restroom without difficulty.  [facility] in route ETA 66mn

## 2022-10-29 NOTE — ED Triage Notes (Signed)
C/O shortness of breathe and 10 lbs weight gain x 3 days, endorsed hx of HF.

## 2022-10-29 NOTE — H&P (Addendum)
History and Physical  Tanya Hogan [id number] DOB: 1950/11/03 DOA: 10/29/2022  Referring physician: Accepted by Dr. Olevia Bowens, [facility]  PCP: Hali Marry, MD  Outpatient Specialists: Cardiology. Patient coming from: Home through [facility] ED  Chief Complaint:  Shortness of breath, unintentional weight gain.  HPI: Tanya Hogan is a 72 y.o. female with medical history significant for HFrEF 20-25%, type 2 diabetes, severe morbid obesity, OSA on BiPAP, who presents to [facility] ED with complaints of dyspnea with minimal exertion and unintentional weight gain.  Recently admitted for heart failure exacerbation.  States she has gained about 15 pounds over the last 3 days.  She is on Bumex 1 mg twice daily.  Has taken extra doses of Bumex without improvement of her weight gain.  Has had difficulty restricting fluid due to dry mouth.  In the ED, workup revealed acute heart failure exacerbation with elevated BNP, mild pulmonary edema on chest x-ray.  TRH, hospitalist service, was asked to admit.  The patient was accepted by Dr. Olevia Bowens.  Transfer to [facility] telemetry cardiac unit as inpatient status.   ED Course: Tmax 98.2.  BP 149/79, pulse 65, respiration rate 18, saturation 98% on room air.  Lab studies remarkable for creatinine 1.15 with GFR 50.  BNP 535.  Troponin negative.  CBC essentially unremarkable.  Review of Systems: Review of systems as noted in the HPI. All other systems reviewed and are negative.   Past Medical History:  Diagnosis Date   Alkaline phosphatase elevation    Asthma    Breast cancer ([place]) 2011   Carpal tunnel syndrome    bilateral   Chronic HFrEF (heart failure with reduced ejection fraction) (HCC)    Cirrhosis, non-alcoholic (HCC)    Closed fracture of unspecified part of radius (alone)    DDD (degenerative disc disease), lumbar    Depression    pt denies   Diabetes mellitus without complication ([place])    Enlarged heart    Fibromyalgia    GERD  (gastroesophageal reflux disease)    History of kidney stones    HTN (hypertension)    Hypothyroidism    IBS (irritable bowel syndrome)    LBBB (left bundle branch block)    Microcytic anemia 05/14/2017   Mitral regurgitation    NICM (nonischemic cardiomyopathy) (HCC)    Pneumonia    Presence of permanent cardiac pacemaker    Pulmonary hypertension (HCC)    PVD (peripheral vascular disease) ([place])    2019   Sleep apnea    uses cpap   Tricuspid regurgitation    Ventricular tachyarrhythmia [facility])    Past Surgical History:  Procedure Laterality Date   BIV PACEMAKER GENERATOR CHANGEOUT N/A 11/03/2021   Procedure: BIV PACEMAKER GENERATOR CHANGEOUT;  Surgeon: Evans Lance, MD;  Location: [place] CV LAB;  Service: Cardiovascular;  Laterality: N/A;   BREAST LUMPECTOMY Left 2011   COLONOSCOPY     FOOT SURGERY     KNEE SURGERY     LEAD REVISION/REPAIR N/A 11/03/2021   Procedure: LEAD REVISION/REPAIR;  Surgeon: Evans Lance, MD;  Location: [place] CV LAB;  Service: Cardiovascular;  Laterality: N/A;   MASTECTOMY Right    05/2019   PACEMAKER GENERATOR CHANGE N/A 08/23/2014   Procedure: PACEMAKER GENERATOR CHANGE;  Surgeon: Evans Lance, MD;  Location: [facility] CATH LAB;  Service: Cardiovascular;  Laterality: N/A;   PACEMAKER PLACEMENT  2009   [facility]   RECTAL SURGERY  1976   REDUCTION MAMMAPLASTY Left  05/2019   TOTAL SHOULDER ARTHROPLASTY Right 03/10/2018   TOTAL SHOULDER ARTHROPLASTY Right 03/10/2018   Procedure: RIGHT TOTAL SHOULDER ARTHROPLASTY;  Surgeon: [name], MD;  Location: [place];  Service: Orthopedics;  Laterality: Right;   TUBAL LIGATION      Social History:  reports that she quit smoking about 56 years ago. Her smoking use included cigarettes. She has never used smokeless tobacco. She reports that she does not drink alcohol and does not use drugs.   Allergies  Allergen Reactions   Injectafer [Ferric Carboxymaltose] Shortness Of Breath     Mild SOB following injectafer infusion Mild SOB following injectafer infusion    Penicillins Anaphylaxis and Rash    Has patient had a PCN reaction causing immediate rash, facial/tongue/throat swelling, SOB or lightheadedness with hypotension: Yes Has patient had a PCN reaction causing severe rash involving mucus membranes or skin necrosis: No Has patient had a PCN reaction that required hospitalization: No Has patient had a PCN reaction occurring within the last 10 years: No If all of the above answers are "NO", then may proceed with Cephalosporin use. "Stuttered with medication"     Accupril [Quinapril Hcl] Other (See Comments)    Hyperkalemia=high potassium level   Aleve [Naproxen] Swelling   Celebrex [Celecoxib]     ulcers   Cozaar [Losartan Potassium] Itching   Flonase [Fluticasone] Other (See Comments)    Nasal ulcer.     Levomenol Other (See Comments)    unknown unknown    Lipitor [Atorvastatin] Other (See Comments)    muscle aches/pains   Nasonex [Mometasone Furoate] Other (See Comments)    nosebleed   Xyzal [Levocetirizine] Other (See Comments)    Doesn't recall reaction type   Bentyl [Dicyclomine]    Chamomile Other (See Comments)    unknown   Entresto [Sacubitril-Valsartan]     Hyperkalemia   Jardiance [Empagliflozin]     Dehydrated, kidney dysfunction per patient   Vesicare [Solifenacin] Other (See Comments)    "stopped me from urinating"   Aspirin Other (See Comments)    Pt states "burns holes in stomach", ulcers    Baking Soda-Fluoride [Sodium Fluoride] Itching and Rash   Clindamycin/Lincomycin Rash   Codeine Nausea And Vomiting   Furosemide Other (See Comments)    Nose bleed   Lactose Intolerance (Gi) Other (See Comments)    Increased calcium, sneezing, nasal issues   Lanolin-Petrolatum Rash   Metformin And Related Diarrhea   Miralax [Polyethylene Glycol] Rash   Quinine Rash   Reclast [Zoledronic Acid] Rash and Other (See Comments)    shaking    Rifampin Other (See Comments)    DOES NOT REMEMBER THIS MEDICATION OR ALLERGY   Sulfa Antibiotics Rash   Sulfonamide Derivatives Rash   Tramadol Rash    Anxiety   Wellbutrin [Bupropion] Other (See Comments)    Muscle aches, pain, "made my skin crawl"   Wool Alcohol [Lanolin] Rash    Family History  Problem Relation Age of Onset   Heart disease Mother    Melanoma Mother    Parkinson's disease Mother    Heart disease Father    Heart disease Brother    Heart failure Other    Breast cancer Other    Hypertension Other       Prior to Admission medications   Medication Sig Start Date End Date Taking? Authorizing Provider  albuterol (PROVENTIL) (2.5 MG/3ML) 0.083% nebulizer solution Take 2.5 mg by nebulization every 6 (six) hours as needed for wheezing or shortness of  breath.    [provider]  albuterol (VENTOLIN HFA) 108 (90 Base) MCG/ACT inhaler Inhale 1-2 puffs into the lungs every 6 (six) hours as needed for wheezing or shortness of breath. 05/27/21   Hali Marry, MD  alendronate (FOSAMAX) 70 MG tablet TAKE 1 TABLET BY MOUTH  EVERY 7 DAYS WITH A FULL  GLASS OF WATER ON AN EMPTY  STOMACH 06/18/22   Hali Marry, MD  AMBULATORY NON FORMULARY MEDICATION Medication Name: Tubing and supplies for nebulizer. Fax - [phone] 08/14/19   Hali Marry, MD  amiodarone (PACERONE) 200 MG tablet TAKE  200 MG ONCE A DAY MONDAY THRU THURSDAY  TAKE  200 MG TWICE A DAY FRIDAY THROUGH SUNDAY 10/09/22   Baldwin Jamaica, PA-C  aspirin 81 MG chewable tablet Chew by mouth daily.    [provider]  bumetanide (BUMEX) 1 MG tablet Take 1 tablet (1 mg total) by mouth 2 (two) times daily. 10/09/22   Freada Bergeron, MD  carvedilol (COREG) 6.25 MG tablet Take 1 tablet (6.25 mg total) by mouth in the morning and at bedtime. 09/28/22   Dunn, Nedra Hai, PA-C  clotrimazole-betamethasone (LOTRISONE) cream 1 Application 2 (two) times daily. 09/03/22 09/03/23  [provider]  Continuous Blood Gluc Receiver (FREESTYLE LIBRE 2 READER) DEVI 6 (six) sensors/90 day supply. 4 refills per year. DX:E11.8 10/06/22   Hali Marry, MD  Continuous Blood Gluc Sensor (FREESTYLE LIBRE 2 SENSOR) MISC 1 Device by Does not apply route every 14 (fourteen) days. 07/10/22   Hali Marry, MD  fexofenadine (ALLEGRA) 180 MG tablet Take 180 mg by mouth in the morning.    [provider]  fluticasone-salmeterol (ADVAIR HFA) 115-21 MCG/ACT inhaler Inhale 2 puffs into the lungs 2 (two) times daily.    [provider]  glucose blood test strip To be used to check blood glucose Dx:E11.8 FreeStyle Precision blood glucose for Freestyle Libre 2 08/25/22   Hali Marry, MD  levothyroxine (SYNTHROID) 88 MCG tablet TAKE 1 TABLET(88 MCG) BY MOUTH DAILY 09/22/22   Hali Marry, MD  LINZESS 72 MCG capsule Take 72-144 mcg by mouth 4 (four) times daily as needed (constipation.). 02/20/21   [provider]  MAGNESIUM PO Take 1 tablet by mouth daily.    [provider]  metFORMIN (GLUCOPHAGE-XR) 500 MG 24 hr tablet Take 1 tablet (500 mg total) by mouth 2 (two) times daily with a meal. 10/19/22   Hali Marry, MD  Multiple Vitamins-Minerals (OCUVITE EYE + MULTI) TABS Take 1 tablet by mouth in the morning.    [provider]  Omega-3 Fatty Acids (FISH OIL) 1000 MG CAPS Take by mouth.    [provider]  pantoprazole (PROTONIX) 40 MG tablet TAKE 1 TABLET BY MOUTH DAILY  BEFORE BREAKFAST 10/20/22   Hali Marry, MD  pravastatin (PRAVACHOL) 40 MG tablet Take 1 tablet (40 mg total) by mouth every evening. 09/09/22   Dunn, [name] N, PA-C  SODIUM FLUORIDE 5000 PLUS 1.1 % CREA dental cream as directed. 06/10/22   [provider]  Tiotropium Bromide Monohydrate (SPIRIVA RESPIMAT) 1.25 MCG/ACT AERS Inhale 2 puffs into the lungs daily.    [provider]  valACYclovir (VALTREX) 1000 MG tablet  Take 1 tablet (1,000 mg total) by mouth 2 (two) times daily. Patient taking differently: Take 1,000 mg by mouth as needed (fever blisters/cold sores.). 08/05/21   Hali Marry, MD    Physical Exam: BP [name])  149/79 (BP Location: Right Arm)   Pulse 65   Temp 98.2 F (36.8 C) (Oral)   Resp 18   Ht '5\' 1"'$  (1.549 m)   Wt 98.8 kg   SpO2 98%   BMI 41.16 kg/m   General: 72 y.o. year-old female well developed well nourished in no acute distress.  Alert and oriented x3. Cardiovascular: Irregular rate and rhythm with no rubs or gallops.  No thyromegaly or JVD noted.  No lower extremity edema. 2/4 pulses in all 4 extremities. Respiratory: Faint rales at bases. Good inspiratory effort. Abdomen: Soft nontender nondistended with normal bowel sounds x4 quadrants. Muskuloskeletal: No cyanosis, clubbing or edema noted bilaterally Neuro: CN II-XII intact, strength, sensation, reflexes Skin: No ulcerative lesions noted or rashes Psychiatry: Judgement and insight appear normal. Mood is appropriate for condition and setting          Labs on Admission:  Basic Metabolic Panel: Recent Labs  Lab 10/26/22 0734 10/29/22 1429  NA 139 136  K 4.8 4.1  CL 101 105  CO2 26 24  GLUCOSE 104* 120*  BUN 17 21  CREATININE 0.93 1.15*  CALCIUM 10.6* 9.2   Liver Function Tests: Recent Labs  Lab 10/29/22 1429  AST 28  ALT 25  ALKPHOS 102  BILITOT 0.5  PROT 6.9  ALBUMIN 3.6   No results for input(s): "LIPASE", "AMYLASE" in the last 168 hours. No results for input(s): "AMMONIA" in the last 168 hours. CBC: Recent Labs  Lab 10/29/22 1429  WBC 7.0  NEUTROABS 4.3  HGB 13.2  HCT 40.7  MCV 96.2  PLT 238   Cardiac Enzymes: No results for input(s): "CKTOTAL", "CKMB", "CKMBINDEX", "TROPONINI" in the last 168 hours.  BNP (last 3 results) Recent Labs    07/13/22 0754 08/10/22 1158 10/29/22 1429  BNP 530.7* 193* 535.5*    ProBNP (last 3 results) Recent Labs    10/15/22 0847  10/26/22 0734  PROBNP 1,693* 1,312*    CBG: No results for input(s): "GLUCAP" in the last 168 hours.  Radiological Exams on Admission: DG Chest 2 View  Result Date: 10/29/2022 CLINICAL DATA:  Shortness of breath EXAM: CHEST - 2 VIEW COMPARISON:  Chest x-ray 07/13/2022 and older FINDINGS: Right shoulder arthroplasty. Left upper chest pacemaker. Enlarged cardiopericardial silhouette with some vascular congestion. No pneumothorax, effusion or consolidation. Degenerative changes seen of the spine on lateral view. Films are under penetrated. IMPRESSION: Enlarged cardiopericardial silhouette with vascular congestion. Pacemaker. Electronically Signed   By: Jill Side M.D.   On: 10/29/2022 15:05    EKG: I independently viewed the EKG done and my findings are as followed: Atrial fibrillation rate of 60.  Nonspecific ST-T changes.  QTc 537.  Assessment/Plan Present on Admission:  Acute systolic heart failure (HCC)  Principal Problem:   Acute systolic heart failure (HCC)  Acute on chronic combined diastolic and systolic CHF with LVEF 20 to 25% and grade 1 diastolic dysfunction. Presented with elevated BNP greater than 500, mild pulmonary edema seen on chest x-ray, personally reviewed Follow 2D echo Last 2D echo with LVEF 20 to [id number], grade 1 diastolic dysfunction on [id number]. Start strict I's and O's and daily weight  Prolonged QTc Avoid QTc prolonging agents Optimize magnesium and potassium levels.  Permanent atrial fibrillation, rate controlled Not on oral anticoagulation Resume home Coreg Closely monitor on telemetry.  Hypothyroidism Resume home regimen.  OSA, on BiPAP Resume BiPAP nightly  Severe obesity BMI 41 Recommend weight loss outpatient with regular physical activity and healthy dieting.  DVT prophylaxis: SQ Lovenox daily   Code Status: Full code, stated by the patient herself.  Family Communication: None at bedside.  Disposition Plan: Admitted to telemetry, no  unit  Consults called: None.  Consider consulting cardiology in the morning.  Admission status: Inpatient status.   Status is: Inpatient The patient requires at least 2 midnights for further evaluation and treatment of present condition.   Kayleen Memos MD [facility] Pager [phone]  If 7PM-7AM, please contact night-coverage [email or web address] Password [facility]  10/29/2022, 9:53 PM

## 2022-10-29 NOTE — ED Notes (Signed)
Ambulated on r/a endorses mild shortness of breath. SpO2 >94%  10/29/22 1500  Resting  Supplemental oxygen during test? No  Resting Heart Rate 72  Resting Sp02 95  Lap 1 (250 feet)  HR 80  02 Sat 94

## 2022-10-29 NOTE — ED Provider Notes (Signed)
[place] EMERGENCY DEPARTMENT AT [place] Provider Note   CSN: ID:[id number] Arrival date & time: 10/29/22  1419     History  Chief Complaint  Patient presents with   Shortness of Breath    Tanya Hogan is a 72 y.o. female history of heart failure with a EF of 25%, here presenting with shortness of breath and weight gain.  Patient was recently admitted for heart failure exacerbation.  Patient states that she gained about 15 pounds over the last 3 days.  She is taking her Bumex 1 mg twice daily.  She states that she added another 1 mg daily for the last 3 days but patient was unable to get rid of the weight. Patient states that she uses BiPAP at night to sleep but she gets very short of breath despite using the BiPAP.  The history is provided by the patient.       Home Medications Prior to Admission medications   Medication Sig Start Date End Date Taking? Authorizing Provider  albuterol (PROVENTIL) (2.5 MG/3ML) 0.083% nebulizer solution Take 2.5 mg by nebulization every 6 (six) hours as needed for wheezing or shortness of breath.    [provider]  albuterol (VENTOLIN HFA) 108 (90 Base) MCG/ACT inhaler Inhale 1-2 puffs into the lungs every 6 (six) hours as needed for wheezing or shortness of breath. 05/27/21   Hali Marry, MD  alendronate (FOSAMAX) 70 MG tablet TAKE 1 TABLET BY MOUTH  EVERY 7 DAYS WITH A FULL  GLASS OF WATER ON AN EMPTY  STOMACH 06/18/22   Hali Marry, MD  AMBULATORY NON FORMULARY MEDICATION Medication Name: Tubing and supplies for nebulizer. Fax - [phone] 08/14/19   Hali Marry, MD  amiodarone (PACERONE) 200 MG tablet TAKE  200 MG ONCE A DAY MONDAY THRU THURSDAY  TAKE  200 MG TWICE A DAY FRIDAY THROUGH SUNDAY 10/09/22   Baldwin Jamaica, PA-C  aspirin 81 MG chewable tablet Chew by mouth daily.    [provider]  bumetanide (BUMEX) 1 MG tablet Take 1 tablet (1 mg total) by mouth 2 (two) times daily.  10/09/22   Freada Bergeron, MD  carvedilol (COREG) 6.25 MG tablet Take 1 tablet (6.25 mg total) by mouth in the morning and at bedtime. 09/28/22   Dunn, Nedra Hai, PA-C  clotrimazole-betamethasone (LOTRISONE) cream 1 Application 2 (two) times daily. 09/03/22 09/03/23  [provider]  Continuous Blood Gluc Receiver (FREESTYLE LIBRE 2 READER) DEVI 6 (six) sensors/90 day supply. 4 refills per year. DX:E11.8 10/06/22   Hali Marry, MD  Continuous Blood Gluc Sensor (FREESTYLE LIBRE 2 SENSOR) MISC 1 Device by Does not apply route every 14 (fourteen) days. 07/10/22   Hali Marry, MD  fexofenadine (ALLEGRA) 180 MG tablet Take 180 mg by mouth in the morning.    [provider]  fluticasone-salmeterol (ADVAIR HFA) 115-21 MCG/ACT inhaler Inhale 2 puffs into the lungs 2 (two) times daily.    [provider]  glucose blood test strip To be used to check blood glucose Dx:E11.8 FreeStyle Precision blood glucose for Freestyle Libre 2 08/25/22   Hali Marry, MD  levothyroxine (SYNTHROID) 88 MCG tablet TAKE 1 TABLET(88 MCG) BY MOUTH DAILY 09/22/22   Hali Marry, MD  LINZESS 72 MCG capsule Take 72-144 mcg by mouth 4 (four) times daily as needed (constipation.). 02/20/21   [provider]  MAGNESIUM PO Take 1 tablet by mouth daily.    [provider]  metFORMIN (GLUCOPHAGE-XR) 500 MG 24 hr tablet Take 1 tablet (500 mg total) by mouth 2 (two) times daily with a meal. 10/19/22   Hali Marry, MD  Multiple Vitamins-Minerals (OCUVITE EYE + MULTI) TABS Take 1 tablet by mouth in the morning.    [provider]  Omega-3 Fatty Acids (FISH OIL) 1000 MG CAPS Take by mouth.    [provider]  pantoprazole (PROTONIX) 40 MG tablet TAKE 1 TABLET BY MOUTH DAILY  BEFORE BREAKFAST 10/20/22   Hali Marry, MD  pravastatin (PRAVACHOL) 40 MG tablet Take 1 tablet (40 mg total) by mouth every evening. 09/09/22   Dunn, [name] N, PA-C   SODIUM FLUORIDE 5000 PLUS 1.1 % CREA dental cream as directed. 06/10/22   [provider]  Tiotropium Bromide Monohydrate (SPIRIVA RESPIMAT) 1.25 MCG/ACT AERS Inhale 2 puffs into the lungs daily.    [provider]  valACYclovir (VALTREX) 1000 MG tablet Take 1 tablet (1,000 mg total) by mouth 2 (two) times daily. Patient taking differently: Take 1,000 mg by mouth as needed (fever blisters/cold sores.). 08/05/21   Hali Marry, MD      Allergies    Christeen Douglas [ferric carboxymaltose], Penicillins, Accupril [quinapril hcl], Aleve [naproxen], Celebrex [celecoxib], Cozaar [losartan potassium], Flonase [fluticasone], Levomenol, Lipitor [atorvastatin], Nasonex [mometasone furoate], Xyzal [levocetirizine], Bentyl [dicyclomine], Chamomile, Entresto [sacubitril-valsartan], Jardiance [empagliflozin], Vesicare [solifenacin], Aspirin, Baking soda-fluoride [sodium fluoride], Clindamycin/lincomycin, Codeine, Furosemide, Lactose intolerance (gi), Lanolin-petrolatum, Metformin and related, Miralax [polyethylene glycol], Quinine, Reclast [zoledronic acid], Rifampin, Sulfa antibiotics, Sulfonamide derivatives, Tramadol, Wellbutrin [bupropion], and Wool alcohol [lanolin]    Review of Systems   Review of Systems  Respiratory:  Positive for shortness of breath.   All other systems reviewed and are negative.   Physical Exam Updated Vital Signs BP 134/60 (BP Location: Left Arm)   Pulse 65   Temp 97.7 F (36.5 C) (Oral)   Resp (!) 32   Ht '5\' 1"'$  (1.549 m)   Wt 97.5 kg   SpO2 100%   BMI 40.62 kg/m  Physical Exam Vitals and nursing note reviewed.  Constitutional:      Comments: Chronically ill and tachypneic  HENT:     Head: Normocephalic.  Eyes:     Extraocular Movements: Extraocular movements intact.     Pupils: Pupils are equal, round, and reactive to light.  Cardiovascular:     Rate and Rhythm: Normal rate and regular rhythm.  Pulmonary:     Comments: Tachypneic and  crackles bilateral bases Musculoskeletal:        General: Normal range of motion.     Cervical back: Normal range of motion and neck supple.     Comments: 1+ edema bilaterally  Skin:    Capillary Refill: Capillary refill takes less than 2 seconds.  Neurological:     General: No focal deficit present.     Mental Status: She is alert and oriented to person, place, and time.  Psychiatric:        Mood and Affect: Mood normal.        Behavior: Behavior normal.     ED Results / Procedures / Treatments   Labs (all labs ordered are listed, but only abnormal results are displayed) Labs Reviewed  COMPREHENSIVE METABOLIC PANEL - Abnormal; Notable for the following components:      Result Value   Glucose, Bld 120 (*)    Creatinine, Ser 1.15 (*)    GFR, Estimated 50 (*)    All other components within normal  limits  BRAIN NATRIURETIC PEPTIDE - Abnormal; Notable for the following components:   B Natriuretic Peptide 535.5 (*)    All other components within normal limits  TROPONIN I (HIGH SENSITIVITY) - Abnormal; Notable for the following components:   Troponin I (High Sensitivity) 19 (*)    All other components within normal limits  CBC WITH DIFFERENTIAL/PLATELET    EKG EKG Interpretation  Date/Time:  Thursday October 29 2022 14:35:01 EST Ventricular Rate:  60 PR Interval:  167 QRS Duration: 191 QT Interval:  537 QTC Calculation: 537 R Axis:   128 Text Interpretation: VENTRICULAR PACED RHYTHM Paired ventricular premature complexes Nonspecific intraventricular conduction delay Repol abnrm suggests ischemia, diffuse leads No significant change since last tracing Confirmed by Wandra Arthurs [phone]) on 10/29/2022 3:12:22 PM  Radiology DG Chest 2 View  Result Date: 10/29/2022 CLINICAL DATA:  Shortness of breath EXAM: CHEST - 2 VIEW COMPARISON:  Chest x-ray 07/13/2022 and older FINDINGS: Right shoulder arthroplasty. Left upper chest pacemaker. Enlarged cardiopericardial silhouette with some  vascular congestion. No pneumothorax, effusion or consolidation. Degenerative changes seen of the spine on lateral view. Films are under penetrated. IMPRESSION: Enlarged cardiopericardial silhouette with vascular congestion. Pacemaker. Electronically Signed   By: Jill Side M.D.   On: 10/29/2022 15:05    Procedures Procedures    Medications Ordered in ED Medications  bumetanide (BUMEX) injection 1 mg (has no administration in time range)    ED Course/ Medical Decision Making/ A&P                             Medical Decision Making LARSYN LOSA is a 72 y.o. female here presenting with shortness of breath and weight gain.  I think likely heart failure exacerbation.  Patient is taking Bumex 1 mg 3 times a day.  Patient appears volume overloaded.  Plan to get CBC and CMP and BNP and troponin and chest x-ray.  Patient states that she cannot get Lasix as she has side effects from it.  She request Bumex IV.  5:22 PM BNP is 500.  Chest x-ray showed vascular congestion.  I talked to Dr. Harriet Masson from cardiology.  She states that patient has multiple medical problems she recommend hospitalist admission.  Hospitalist to admit for CHF exacerbation.  Problems Addressed: Acute on chronic congestive heart failure, unspecified heart failure type [facility]): chronic illness or injury with exacerbation, progression, or side effects of treatment  Amount and/or Complexity of Data Reviewed Labs: ordered. Decision-making details documented in ED Course. Radiology: ordered and independent interpretation performed. Decision-making details documented in ED Course. ECG/medicine tests: ordered and independent interpretation performed. Decision-making details documented in ED Course.  Risk Prescription drug management. Decision regarding hospitalization.    Final Clinical Impression(s) / ED Diagnoses Final diagnoses:  None    Rx / DC Orders ED Discharge Orders     None         Drenda Freeze,  MD 10/29/22 1723

## 2022-10-29 NOTE — ED Notes (Signed)
Called lab to add troponin onto previously collected labs

## 2022-10-29 NOTE — ED Notes (Addendum)
Report given to Artois @ [facility] Report given to [facility]

## 2022-10-29 NOTE — Telephone Encounter (Signed)
Called patient to inform her of lab results. She reporting of swelling, shortness of breath, and feeling sluggish. She does weight her self daily  3/5 206 lb 3/6 216 lb On this day the patient took 3 fluid pills 3/7 215 lb  Pt stated she did have an appointment with digestive health and some of diabetic medications were changed. The patient did not contact our clinic pertaining to her weight gain. Educated the patient on the importance of daily weights. Patient replied " I was going to call the office but I can't make bowel and I'm kind of confused".  Advised by Dr. Johney Frame: Patient should go to the emergency room, she  need IV diuresis. Called the patient back and explained Dr. Johney Frame advise. Patient stated she will to to the Emergency room in [facility]. Will forward to MD and nurse.

## 2022-10-30 ENCOUNTER — Inpatient Hospital Stay (HOSPITAL_COMMUNITY): Payer: 59

## 2022-10-30 ENCOUNTER — Encounter: Payer: Self-pay | Admitting: Hematology and Oncology

## 2022-10-30 ENCOUNTER — Other Ambulatory Visit (HOSPITAL_COMMUNITY): Payer: Self-pay

## 2022-10-30 DIAGNOSIS — I5021 Acute systolic (congestive) heart failure: Secondary | ICD-10-CM

## 2022-10-30 DIAGNOSIS — E7800 Pure hypercholesterolemia, unspecified: Secondary | ICD-10-CM

## 2022-10-30 DIAGNOSIS — I5023 Acute on chronic systolic (congestive) heart failure: Secondary | ICD-10-CM

## 2022-10-30 DIAGNOSIS — E66813 Obesity, class 3: Secondary | ICD-10-CM | POA: Diagnosis present

## 2022-10-30 DIAGNOSIS — I10 Essential (primary) hypertension: Secondary | ICD-10-CM

## 2022-10-30 LAB — CBC
HCT: 39.5 % (ref 36.0–46.0)
Hemoglobin: 12.6 g/dL (ref 12.0–15.0)
MCH: 31.2 pg (ref 26.0–34.0)
MCHC: 31.9 g/dL (ref 30.0–36.0)
MCV: 97.8 fL (ref 80.0–100.0)
Platelets: 202 10*3/uL (ref 150–400)
RBC: 4.04 MIL/uL (ref 3.87–5.11)
RDW: 13.3 % (ref 11.5–15.5)
WBC: 7.1 10*3/uL (ref 4.0–10.5)
nRBC: 0 % (ref 0.0–0.2)

## 2022-10-30 LAB — PHOSPHORUS: Phosphorus: 3.3 mg/dL (ref 2.5–4.6)

## 2022-10-30 LAB — ECHOCARDIOGRAM COMPLETE
AR max vel: 2.39 cm2
AV Area VTI: 2.29 cm2
AV Area mean vel: 2.24 cm2
AV Mean grad: 5 mmHg
AV Peak grad: 9.6 mmHg
Ao pk vel: 1.55 m/s
Area-P 1/2: 4.49 cm2
Height: 61 in
S' Lateral: 5.5 cm
Weight: 3467.39 oz

## 2022-10-30 LAB — BASIC METABOLIC PANEL
Anion gap: 9 (ref 5–15)
BUN: 16 mg/dL (ref 8–23)
CO2: 21 mmol/L — ABNORMAL LOW (ref 22–32)
Calcium: 9.3 mg/dL (ref 8.9–10.3)
Chloride: 108 mmol/L (ref 98–111)
Creatinine, Ser: 1.02 mg/dL — ABNORMAL HIGH (ref 0.44–1.00)
GFR, Estimated: 58 mL/min — ABNORMAL LOW (ref >60)
Glucose, Bld: 162 mg/dL — ABNORMAL HIGH (ref 70–99)
Potassium: 3.7 mmol/L (ref 3.5–5.1)
Sodium: 138 mmol/L (ref 135–145)

## 2022-10-30 LAB — MAGNESIUM: Magnesium: 2 mg/dL (ref 1.7–2.4)

## 2022-10-30 MED ORDER — SPIRONOLACTONE 12.5 MG HALF TABLET
12.5000 mg | ORAL_TABLET | Freq: Every day | ORAL | Status: DC
  Administered 2022-10-30 – 2022-10-31 (×2): 12.5 mg via ORAL
  Filled 2022-10-30 (×2): qty 1

## 2022-10-30 MED ORDER — BUMETANIDE 0.25 MG/ML IJ SOLN
2.0000 mg | Freq: Once | INTRAMUSCULAR | Status: AC
  Administered 2022-10-30: 2 mg via INTRAVENOUS
  Filled 2022-10-30: qty 8

## 2022-10-30 MED ORDER — FUROSEMIDE 10 MG/ML IJ SOLN
60.0000 mg | Freq: Once | INTRAMUSCULAR | Status: DC
  Filled 2022-10-30: qty 6

## 2022-10-30 MED ORDER — PERFLUTREN LIPID MICROSPHERE
1.0000 mL | INTRAVENOUS | Status: AC | PRN
  Administered 2022-10-30: 2 mL via INTRAVENOUS

## 2022-10-30 NOTE — Assessment & Plan Note (Signed)
Her glucose remained stable during her hospitalization.  At the time of her discharge her fasting glucose is 129 mg/dl.  Patient has been placed on SGLT 2 inh and will hold on metformin for now.  Follow up HgA1c as outpatient.   Continue with statin therapy.

## 2022-10-30 NOTE — Assessment & Plan Note (Signed)
Calculated BMI is 40,9

## 2022-10-30 NOTE — TOC Progression Note (Signed)
Transition of Care [facility]) - Progression Note    Patient Details  Name: Tanya Hogan MRN: [id number] Date of Birth: 1950-12-18  Transition [facility] CM/SW Contact  Zenon Mayo, RN Phone Number: 10/30/2022, 4:29 PM  Clinical Narrative:    From home with brother, acute sys HF ex, on bumex po, indep.  TOC following.        Expected Discharge Plan and Services                                               Social Determinants of Health (SDOH) Interventions SDOH Screenings   Food Insecurity: No Food Insecurity (10/29/2022)  Housing: Low Risk  (10/29/2022)  Transportation Needs: No Transportation Needs (10/29/2022)  Utilities: Not At Risk (10/29/2022)  Alcohol Screen: Low Risk  (08/06/2022)  Depression (PHQ2-9): Low Risk  (10/27/2022)  Financial Resource Strain: Low Risk  (08/06/2022)  Physical Activity: Sufficiently Active (08/06/2022)  Social Connections: Moderately Integrated (08/06/2022)  Stress: No Stress Concern Present (08/06/2022)  Tobacco Use: Medium Risk (10/29/2022)    Readmission Risk Interventions     No data to display

## 2022-10-30 NOTE — Progress Notes (Signed)
Heart Failure Navigator Progress Note  Assessed for Heart & Vascular TOC clinic readiness.  Patient EF 20-25%, has a scheduled [facility] appointment on 11/24/2022.   Navigator will sign off at this time.   Earnestine Leys, BSN, Clinical cytogeneticist Only

## 2022-10-30 NOTE — Assessment & Plan Note (Signed)
Continue with levothyroxine  

## 2022-10-30 NOTE — Assessment & Plan Note (Addendum)
Echocardiogram with reduced LV systolic function 20 to 123456, global hypokinesis, RV systolic function with moderate reduction, LA with severe dilatation, RA with moderate dilatation, trivial pericardial effusion. No significant valvular disease.  Sp BiV PPM   Patient was placed on IV furosemide, negative fluid balance was achieved, -3,230 ml, with significant improvement in her symptoms.   Limited pharmacological therapy due multiple drug intolerances.  She tolerated well SGLT 2 inh  Plan to continue bumetanide 1 mg bid and increase to 3 mg per day in case of volume overload.  Continue carvedilol at 3,125 mg po daily.   Patient did not tolerate MRA, ARB or ACE inh due to hyperkalemia.  May consider adding sodium zirconium along with RAS inhibition as outpatient with close monitoring of renal function and electrolytes.   Outpatient records from cardiology personally reviewed.  Follow up with cardiology as scheduled.

## 2022-10-30 NOTE — Progress Notes (Signed)
   Heart Failure Stewardship Pharmacist Progress Note   PCP: Hali Marry, MD PCP-Cardiologist: Freada Bergeron, MD    HPI:  72 yo F with PMH significant for CHF, non-alcoholic cirrhosis, W0JW, HTN, HLD, PVD, and breast cancer s/p mastectomy. She presented to the [facility] ED on 10/29/2022 with 10 lbs of weight gain over the past 3 days. She administered an extra dose of Bumex without improvement. She denies fluid restriction d/t a dry mouth. She was transferred to [facility] hospital for CHF exacerbation.   ECHO on 12/2021 showed EF of 20-25% with severely decreased LV function, G1DD. Echo scheduled for later today. Spoke with patient regarding her medication allergies. She stated she is willing to trial MRA and/or RAAS therapy with appropriate serum K monitoring. She is not willing to trial SGLT2i therapy at this time.    Current HF Medications: Diuretic: bumetanide 1 mg PO BID Beta Blocker: carvedilol 3.125 mg BID  Prior to admission HF Medications: Diuretic: bumetanide 1 mg BID Beta blocker: carvedilol 6.25 mg BID  Pertinent Lab Values: Serum creatinine 1.02, BUN 16, Potassium 3.7, Sodium 138, BNP 535.5, Magnesium 2.0, A1c 6.2  Vital Signs: Weight: 216 lbs (admission weight: 217 lbs) Blood pressure: 120-130/50-70s  Heart rate: 60-70s  I/O: net -0.06L  Medication Assistance / Insurance Benefits Check: Does the patient have prescription insurance?  Yes Type of insurance plan: [facility] Medicare  Outpatient Pharmacy:  Prior to admission outpatient pharmacy: [facility] Is the patient willing to use [place] at discharge?yes Is the patient willing to transition their outpatient pharmacy to utilize a [facility] outpatient pharmacy?   Pending    Assessment: 1. Acute on chronic systolic CHF (LVEF 11-91%). NYHA class III-IV symptoms. - Keep K>4 and Mg>2. Strict I/Os. Daily standing weights - Allergy listed in chart to RAAS therapy with a rxn of itching. Could trial later during  hospitalization if patient is willing.  - Reported hypotension in the past with MRA and SGLT2i therapy. Recommend MRA initiation at this time with serum K monitoring - Patient reported that she had dehydration with Jardiance and is not willing to trial SLGT2i therapy at this time.   Plan: 1) Medication changes recommended at this time: - Initiate spironolactone 12.5 mg once daily - Initiate furosemide 60 mg IV x1  - Discontinue PO bumetanide  2) Patient assistance: - Entresto/Jardiance copay: $0 - Farxiga not on insurance formulary  3)  Education  - To be completed prior to discharge  Maryan Puls, PharmD PGY-1 [facility] Resident

## 2022-10-30 NOTE — Progress Notes (Signed)
Echocardiogram 2D Echocardiogram has been performed.  Tanya Hogan 10/30/2022, 4:21 PM

## 2022-10-30 NOTE — Hospital Course (Addendum)
Tanya Hogan was admitted to the hospital with the working diagnosis of heart failure.   72 yo female with the past medical history of heart failure, T2DM and obesity who presented with dyspnea and weight gain. 15 lbs weight gain over last 3 days, despite increase dose of oral diuretic therapy at home. On her initial physical examination her blood pressure was 149/79, HR 65, RR 18 and 02 saturation 98% on room air, lungs with rales at bases with no wheezing, heart with S1 and S2 present irregular, with no gallops, rubs or murmurs, abdomen with distention, no lower extremity edema.   Na 136, K 4,1 CL 105 bicarbonate 24 glucose 120 bun 21 cr 1.15  BNP 535  High sensitive troponin 19  Wbc 7.0 hgb 13,2 plt 238   Chest radiograph with cardiomegaly, bilateral hilar vascular congestion, with no effusions, pacemaker in place with one atrial and 2 ventricular leads.   EKG 63 bpm, right axis deviation, interventricular conduction delay, atrial sensing and ventricular pacing, with ST depression V4 to V6, with no significant T wave changes.   03/09 patient with improvement in her volume status.  Adjusting medical therapy, she has many drug intolerances.  Possible discharge home tomorrow.

## 2022-10-30 NOTE — Progress Notes (Signed)
   10/30/22 1422  Spiritual Encounters  Type of Visit Attempt (pt unavailable)  OnCall Visit No   Chaplain Tanecia Mccay followed up after Chaplain Exis to provide AD education. Patient was not available as another staff member was with her. Staff stated that she would be done soon and chaplain Kenton Kingfisher waited a few minutes. A code came in and chaplain Kenton Kingfisher had to leave.   Note Prepared by Abbott Pao, Chaplain Resident [phone]

## 2022-10-30 NOTE — Progress Notes (Signed)
   10/30/22 1100  Spiritual Encounters  Type of Visit Initial  Care provided to: Patient  Referral source Patient request  Reason for visit Routine spiritual support  OnCall Visit No  Spiritual Framework  Presenting Themes Values and beliefs  Community/Connection Faith community;Family  Patient Stress Factors Health changes  Interventions  Spiritual Care Interventions Made Reflective listening;Normalization of emotions;Compassionate presence;Prayer   Ch responded to request for emotional and spiritual support. No family at bedside. Pt's is going through significant spiritual distress. She said that her body is tired. Pt's faith plays an important role in her health decisions. Ch asked guided question about faith and values. No follow-up needed at this time.

## 2022-10-30 NOTE — Plan of Care (Signed)

## 2022-10-30 NOTE — Progress Notes (Signed)
  Progress Note   Patient: Tanya Hogan:[id number] DOB: May 31, 1951 DOA: 10/29/2022     1 DOS: the patient was seen and examined on 10/30/2022   Brief hospital course: Tanya Hogan was admitted to the hospital with the working diagnosis of heart failure.   72 yo female with the past medical history of heart failure, T2DM and obesity who presented with dyspnea and weight gain. 15 lbs weight gain over last 3 days, despite increase dose of oral diuretic therapy at home. On her initial physical examination her blood pressure was 149/79, HR 65, RR 18 and 02 saturation 98% on room air, lungs with rales at bases with no wheezing, heart with S1 and S2 present irregular, with no gallops, rubs or murmurs, abdomen with distention, no lower extremity edema.   Na 136, K 4,1 CL 105 bicarbonate 24 glucose 120 bun 21 cr 1.15  BNP 535  High sensitive troponin 19  Wbc 7.0 hgb 13,2 plt 238   Chest radiograph with cardiomegaly, bilateral hilar vascular congestion, with no effusions, pacemaker in place with one atrial and 2 ventricular leads.   EKG 63 bpm, right axis deviation, interventricular conduction delay, atrial sensing and ventricular pacing, with ST depression V4 to V6, with no significant T wave changes.   Assessment and Plan: * Acute on chronic systolic CHF (congestive heart failure) (HCC) Echocardiogram 12/2021 LV with reduction in systolic function 20 to 09%, mild to moderate dilated cavity. Akinetic inferior wall, mid inferoseptal wall, entire anterior septum, and apical inferioseptal. Entire lateral wall, septum and apical inferior segment hypokinetic. RVSP 31.9 (limited study).   Documented urine output is 323 cc  Systolic blood pressure is 140 to 120 mmHg.   Plan to continue diuresis with IV furosemide  Add spironolactone Patient did not tolerate ARB or ACE inh due to hyperkalemia.  Continue carvedilol.   Benign essential hypertension Continue blood pressure control with carvedilol, will  add spironolactone and continue diuresis with furosemide.   Hyperlipidemia Continue with statin therapy.   Hypothyroidism Continue with levothyroxine.   Class 3 obesity (HCC) Calculated BMI is 40,9         Subjective: Patient with improvement in edema but not back to baseline, mainly fluid retention in her abdomen, no chest pain   Physical Exam: Vitals:   10/30/22 0720 10/30/22 0932 10/30/22 1057 10/30/22 1118  BP: 125/73   123/65  Pulse: 65 66  61  Resp: 17 18  18   Temp:    98 F (36.7 C)  TempSrc:    Oral  SpO2: 97% 96% 95% 95%  Weight:      Height:       Neurology awake and alert ENT With mild pallor Cardiovascular with S1 and S2 present and regular with no gallops, rubs or murmurs No JVD No ankle edema Respiratory with no rales or wheezing, no rhonchi Abdomen with mild distention  Data Reviewed:    Family Communication: no family at the bedside   Disposition: Status is: Inpatient Remains inpatient appropriate because: heart failure   Planned Discharge Destination: Home     Author: Tawni Millers, MD 10/30/2022 3:44 PM  For on call review [email or web address].

## 2022-10-30 NOTE — Assessment & Plan Note (Signed)
Continue blood pressure control with carvedilol, will add spironolactone and continue diuresis with furosemide.

## 2022-10-31 LAB — GLUCOSE, CAPILLARY: Glucose-Capillary: 141 mg/dL — ABNORMAL HIGH (ref 70–99)

## 2022-10-31 LAB — BASIC METABOLIC PANEL
Anion gap: 10 (ref 5–15)
BUN: 15 mg/dL (ref 8–23)
CO2: 25 mmol/L (ref 22–32)
Calcium: 9.4 mg/dL (ref 8.9–10.3)
Chloride: 103 mmol/L (ref 98–111)
Creatinine, Ser: 1.07 mg/dL — ABNORMAL HIGH (ref 0.44–1.00)
GFR, Estimated: 55 mL/min — ABNORMAL LOW (ref >60)
Glucose, Bld: 128 mg/dL — ABNORMAL HIGH (ref 70–99)
Potassium: 4.1 mmol/L (ref 3.5–5.1)
Sodium: 138 mmol/L (ref 135–145)

## 2022-10-31 LAB — MAGNESIUM: Magnesium: 2 mg/dL (ref 1.7–2.4)

## 2022-10-31 MED ORDER — AMIODARONE HCL 200 MG PO TABS
200.0000 mg | ORAL_TABLET | ORAL | Status: DC
  Administered 2022-10-31 – 2022-11-01 (×3): 200 mg via ORAL
  Filled 2022-10-31 (×3): qty 1

## 2022-10-31 MED ORDER — AMIODARONE HCL 200 MG PO TABS: 200.0000 mg | ORAL_TABLET | ORAL | Status: DC

## 2022-10-31 MED ORDER — SENNOSIDES-DOCUSATE SODIUM 8.6-50 MG PO TABS
1.0000 | ORAL_TABLET | Freq: Every day | ORAL | Status: DC
  Administered 2022-10-31 – 2022-11-01 (×2): 1 via ORAL
  Filled 2022-10-31 (×2): qty 1

## 2022-10-31 MED ORDER — LORATADINE 10 MG PO TABS
10.0000 mg | ORAL_TABLET | Freq: Every day | ORAL | Status: DC
  Administered 2022-10-31 – 2022-11-01 (×2): 10 mg via ORAL
  Filled 2022-10-31 (×2): qty 1

## 2022-10-31 MED ORDER — EMPAGLIFLOZIN 10 MG PO TABS
10.0000 mg | ORAL_TABLET | Freq: Every day | ORAL | Status: DC
  Administered 2022-10-31 – 2022-11-01 (×2): 10 mg via ORAL
  Filled 2022-10-31 (×2): qty 1

## 2022-10-31 MED ORDER — BUMETANIDE 1 MG PO TABS
1.0000 mg | ORAL_TABLET | Freq: Every day | ORAL | Status: DC
  Administered 2022-10-31 – 2022-11-01 (×2): 1 mg via ORAL
  Filled 2022-10-31 (×2): qty 1

## 2022-10-31 MED ORDER — BISACODYL 10 MG RE SUPP
10.0000 mg | Freq: Once | RECTAL | Status: AC | PRN
  Administered 2022-10-31: 10 mg via RECTAL
  Filled 2022-10-31: qty 1

## 2022-10-31 MED ORDER — LINACLOTIDE 72 MCG PO CAPS
72.0000 ug | ORAL_CAPSULE | Freq: Two times a day (BID) | ORAL | Status: DC
  Administered 2022-10-31 – 2022-11-01 (×2): 72 ug via ORAL
  Filled 2022-10-31 (×3): qty 1

## 2022-10-31 MED ORDER — SENNA 8.6 MG PO TABS
1.0000 | ORAL_TABLET | Freq: Every day | ORAL | Status: DC
  Administered 2022-10-31: 8.6 mg via ORAL
  Filled 2022-10-31: qty 1

## 2022-10-31 NOTE — Assessment & Plan Note (Signed)
Patient has an ICD implanted.  Plan to continue with amiodarone per her home regimen.

## 2022-10-31 NOTE — Progress Notes (Addendum)
Progress Note   Patient: Tanya Hogan [id number] DOB: July 15, 1951 DOA: 10/29/2022     2 DOS: the patient was seen and examined on 10/31/2022   Brief hospital course: Mrs. Caviness was admitted to the hospital with the working diagnosis of heart failure.   72 yo female with the past medical history of heart failure, T2DM and obesity who presented with dyspnea and weight gain. 15 lbs weight gain over last 3 days, despite increase dose of oral diuretic therapy at home. On her initial physical examination her blood pressure was 149/79, HR 65, RR 18 and 02 saturation 98% on room air, lungs with rales at bases with no wheezing, heart with S1 and S2 present irregular, with no gallops, rubs or murmurs, abdomen with distention, no lower extremity edema.   Na 136, K 4,1 CL 105 bicarbonate 24 glucose 120 bun 21 cr 1.15  BNP 535  High sensitive troponin 19  Wbc 7.0 hgb 13,2 plt 238   Chest radiograph with cardiomegaly, bilateral hilar vascular congestion, with no effusions, pacemaker in place with one atrial and 2 ventricular leads.   EKG 63 bpm, right axis deviation, interventricular conduction delay, atrial sensing and ventricular pacing, with ST depression V4 to V6, with no significant T wave changes.   03/09 patient with improvement in her volume status.  Adjusting medical therapy, she has many drug intolerances.  Possible discharge home tomorrow.   Assessment and Plan: * Acute on chronic systolic CHF (congestive heart failure) (HCC) Echocardiogram with reduced LV systolic function 20 to 123456, global hypokinesis, RV systolic function with moderate reduction, LA with severe dilatation, RA with moderate dilatation, trivial pericardial effusion. No significant valvular disease.  Sp BiV PPM   Documented urine output is 0000000  cc  Systolic blood pressure is 120 to 130 mmHg.   Limited pharmacological therapy due multiple drug intolerances.  Plan to add SGLT 2 inh and continue with bumetanide for  diuresis.  Continue carvedilol.   Patient did not tolerate MRA, ARB or ACE inh due to hyperkalemia.  Outpatient records from cardiology personally reviewed.   Benign essential hypertension Continue blood pressure control with carvedilol, diuresis with bumetanide.   Hyperlipidemia Continue with statin therapy.   Hypothyroidism Continue with levothyroxine.   Class 3 obesity (HCC) Calculated BMI is 40,9   Paroxysmal VT ([place]) Patient has an ICD implanted.  Plan to continue with amiodarone per her home regimen.         Subjective: Patient with no chest pain or leg edema, abdominal distention is improving, but not back to baseline.   Physical Exam: Vitals:   10/31/22 0600 10/31/22 0816 10/31/22 0833 10/31/22 0913  BP: 137/67 122/64    Pulse: 64 65  66  Resp: '18 18  18  '$ Temp: (!) 97.5 F (36.4 C) 97.9 F (36.6 C)    TempSrc: Axillary Oral    SpO2: 99% 96%  97%  Weight:   97.6 kg   Height:       Neurology awake and alert ENT with mild pallor Cardiovascular with S1 and S2 present and rhythmic with no gallops, rubs or murmurs No JVD Respiratory with no rales or wheezing, no rhonchi. Abdomen with no distention   Data Reviewed:    Family Communication: no family at the bedside   Disposition: Status is: Inpatient Remains inpatient appropriate because: adjusting medical regimen,  Planned Discharge Destination: Home possible discharge home tomorrow.       Author: Tawni Millers, MD 10/31/2022 11:47 AM  For on call review [email or web address].

## 2022-11-01 DIAGNOSIS — E785 Hyperlipidemia, unspecified: Secondary | ICD-10-CM

## 2022-11-01 DIAGNOSIS — E038 Other specified hypothyroidism: Secondary | ICD-10-CM

## 2022-11-01 DIAGNOSIS — I4729 Other ventricular tachycardia: Secondary | ICD-10-CM

## 2022-11-01 DIAGNOSIS — E1169 Type 2 diabetes mellitus with other specified complication: Secondary | ICD-10-CM

## 2022-11-01 LAB — BASIC METABOLIC PANEL
Anion gap: 8 (ref 5–15)
BUN: 20 mg/dL (ref 8–23)
CO2: 25 mmol/L (ref 22–32)
Calcium: 10.1 mg/dL (ref 8.9–10.3)
Chloride: 106 mmol/L (ref 98–111)
Creatinine, Ser: 1.1 mg/dL — ABNORMAL HIGH (ref 0.44–1.00)
GFR, Estimated: 53 mL/min — ABNORMAL LOW (ref >60)
Glucose, Bld: 129 mg/dL — ABNORMAL HIGH (ref 70–99)
Potassium: 4.4 mmol/L (ref 3.5–5.1)
Sodium: 139 mmol/L (ref 135–145)

## 2022-11-01 LAB — MAGNESIUM: Magnesium: 2.3 mg/dL (ref 1.7–2.4)

## 2022-11-01 MED ORDER — CARVEDILOL 6.25 MG PO TABS: 3.1250 mg | ORAL_TABLET | Freq: Two times a day (BID) | ORAL | 1 refills | Status: DC

## 2022-11-01 MED ORDER — BUMETANIDE 1 MG PO TABS: 1.0000 mg | ORAL_TABLET | Freq: Two times a day (BID) | ORAL | 1 refills | Status: DC

## 2022-11-01 MED ORDER — BISACODYL 10 MG RE SUPP
10.0000 mg | Freq: Once | RECTAL | Status: AC
  Administered 2022-11-01: 10 mg via RECTAL
  Filled 2022-11-01: qty 1

## 2022-11-01 MED ORDER — EMPAGLIFLOZIN 10 MG PO TABS: 10.0000 mg | ORAL_TABLET | Freq: Every day | ORAL | 0 refills | Status: DC

## 2022-11-01 NOTE — Discharge Summary (Signed)
Physician Discharge Summary   Patient: Tanya Hogan MRN: [id number] DOB: 1951-04-02  Admit date:     10/29/2022  Discharge date: 11/01/22  Discharge Physician: Jimmy Picket Timon Geissinger   PCP: Hali Marry, MD   Recommendations at discharge:    Patient has been placed on SGLT 2 inh with good toleration. Continue with bumetanide 1 mg po bid at home, instructed to take extra 1 mg tablet in case of volume overload 2 to 3 lbs in 24 hrs or 5 lbs in 7 days.  Discontinue metformin for now.  Follow up renal function and electrolytes in 7 days as outpatient. Follow up with Dr Madilyn Fireman in 7 to 10 days.  Follow up with Cardiology as scheduled.   Discharge Diagnoses: Principal Problem:   Acute on chronic systolic CHF (congestive heart failure) (HCC) Active Problems:   Essential hypertension   Hyperlipidemia   Hypothyroidism   Class 3 obesity (HCC)   Paroxysmal VT (HCC)  Resolved Problems:   * No resolved hospital problems. [facility] Course: [name] was admitted to the hospital with the working diagnosis of heart failure.   72 yo female with the past medical history of heart failure, T2DM and obesity who presented with dyspnea and weight gain. 15 lbs weight gain over last 3 days, despite increase dose of oral diuretic therapy at home. On her initial physical examination her blood pressure was 149/79, HR 65, RR 18 and 02 saturation 98% on room air, lungs with rales at bases with no wheezing, heart with S1 and S2 present irregular, with no gallops, rubs or murmurs, abdomen with distention, no lower extremity edema.   Na 136, K 4,1 CL 105 bicarbonate 24 glucose 120 bun 21 cr 1.15  BNP 535  High sensitive troponin 19  Wbc 7.0 hgb 13,2 plt 238   Chest radiograph with cardiomegaly, bilateral hilar vascular congestion, with no effusions, pacemaker in place with one atrial and 2 ventricular leads.   EKG 63 bpm, right axis deviation, interventricular conduction delay, atrial sensing  and ventricular pacing, with ST depression V4 to V6, with no significant T wave changes.   03/09 patient with improvement in her volume status.  Adjusting medical therapy, she has many drug intolerances.  03/10 patient is tolerating well SGLT 2 inh, plan to discharge home today and follow up renal function as outpatient.   Assessment and Plan: * Acute on chronic systolic CHF (congestive heart failure) (HCC) Echocardiogram with reduced LV systolic function 20 to [id number], global hypokinesis, RV systolic function with moderate reduction, LA with severe dilatation, RA with moderate dilatation, trivial pericardial effusion. No significant valvular disease.  Sp BiV PPM   Patient was placed on IV furosemide, negative fluid balance was achieved, -3,230 ml, with significant improvement in her symptoms.   Limited pharmacological therapy due multiple drug intolerances.  She tolerated well SGLT 2 inh  Plan to continue bumetanide 1 mg bid and increase to 3 mg per day in case of volume overload.  Continue carvedilol at 3,125 mg po daily.   Patient did not tolerate MRA, ARB or ACE inh due to hyperkalemia.  May consider adding sodium zirconium along with RAS inhibition as outpatient with close monitoring of renal function and electrolytes.   Outpatient records from cardiology personally reviewed.  Follow up with cardiology as scheduled.   Essential hypertension Continue blood pressure control with carvedilol, diuresis with bumetanide.   Type 2 diabetes mellitus with hyperlipidemia (HCC) Her glucose remained stable during her hospitalization.  At the time of her discharge her fasting glucose is 129 mg/dl.  Patient has been placed on SGLT 2 inh and will hold on metformin for now.  Follow up HgA1c as outpatient.   Continue with statin therapy.   Hypothyroidism Continue with levothyroxine.   Class 3 obesity (HCC) Calculated BMI is 40,9   Paroxysmal VT ([place]) Patient has an ICD implanted.  Plan to  continue with amiodarone per her home regimen.          Consultants: none  Procedures performed: none   Disposition: Home Diet recommendation:  Cardiac and Carb modified diet DISCHARGE MEDICATION: Allergies as of 11/01/2022       Reactions   Injectafer [ferric Carboxymaltose] Shortness Of Breath   Mild SOB following injectafer infusion Mild SOB following injectafer infusion   Penicillins Anaphylaxis, Rash   Has patient had a PCN reaction causing immediate rash, facial/tongue/throat swelling, SOB or lightheadedness with hypotension: Yes Has patient had a PCN reaction causing severe rash involving mucus membranes or skin necrosis: No Has patient had a PCN reaction that required hospitalization: No Has patient had a PCN reaction occurring within the last 10 years: No If all of the above answers are "NO", then may proceed with Cephalosporin use. "Stuttered with medication"   Accupril [quinapril Hcl] Other (See Comments)   Hyperkalemia=high potassium level   Aleve [naproxen] Swelling   Celebrex [celecoxib]    ulcers   Cozaar [losartan Potassium] Itching   Flonase [fluticasone] Other (See Comments)   Nasal ulcer.     Levomenol Other (See Comments)   unknown unknown   Lipitor [atorvastatin] Other (See Comments)   muscle aches/pains   Nasonex [mometasone Furoate] Other (See Comments)   nosebleed   Xyzal [levocetirizine] Other (See Comments)   Doesn't recall reaction type   Bentyl [dicyclomine]    Chamomile Other (See Comments)   unknown   Entresto [sacubitril-valsartan]    Hyperkalemia   Jardiance [empagliflozin]    Dehydrated, kidney dysfunction per patient   Vesicare [solifenacin] Other (See Comments)   "stopped me from urinating"   Aspirin Other (See Comments)   Pt states "burns holes in stomach", ulcers    Baking Soda-fluoride [sodium Fluoride] Itching, Rash   Clindamycin/lincomycin Rash   Codeine Nausea And Vomiting   Furosemide Other (See Comments)   Nose bleed    Lactose Intolerance (gi) Other (See Comments)   Increased calcium, sneezing, nasal issues   Lanolin-petrolatum Rash   Metformin And Related Diarrhea   Miralax [polyethylene Glycol] Rash   Quinine Rash   Reclast [zoledronic Acid] Rash, Other (See Comments)   shaking   Rifampin Other (See Comments)   DOES NOT REMEMBER THIS MEDICATION OR ALLERGY   Sulfa Antibiotics Rash   Sulfonamide Derivatives Rash   Tramadol Rash   Anxiety   Wellbutrin [bupropion] Other (See Comments)   Muscle aches, pain, "made my skin crawl"   Wool Alcohol [lanolin] Rash        Medication List     STOP taking these medications    metFORMIN 500 MG 24 hr tablet Commonly known as: GLUCOPHAGE-XR       TAKE these medications    albuterol (2.5 MG/3ML) 0.083% nebulizer solution Commonly known as: PROVENTIL Take 2.5 mg by nebulization every 6 (six) hours as needed for wheezing or shortness of breath.   albuterol 108 (90 Base) MCG/ACT inhaler Commonly known as: VENTOLIN HFA Inhale 1-2 puffs into the lungs every 6 (six) hours as needed for wheezing or shortness of breath.  alendronate 70 MG tablet Commonly known as: FOSAMAX TAKE 1 TABLET BY MOUTH  EVERY 7 DAYS WITH A FULL  GLASS OF WATER ON AN EMPTY  STOMACH What changed: See the new instructions.   AMBULATORY NON FORMULARY MEDICATION Medication Name: Tubing and supplies for nebulizer. Fax - [phone]   amiodarone 200 MG tablet Commonly known as: PACERONE TAKE  200 MG ONCE A DAY MONDAY THRU THURSDAY  TAKE  200 MG TWICE A DAY [place] What changed:  how much to take how to take this when to take this additional instructions   aspirin 81 MG chewable tablet Chew 81 mg by mouth daily.   bumetanide 1 MG tablet Commonly known as: Bumex Take 1 tablet (1 mg total) by mouth 2 (two) times daily. Take an extra tablet in the morning, if weight gain 2 to 3 lbs in 24 hrs or 5 lbs in 7 days. What changed: additional instructions    carvedilol 6.25 MG tablet Commonly known as: COREG Take 0.5 tablets (3.125 mg total) by mouth in the morning and at bedtime. What changed: how much to take   clotrimazole-betamethasone cream Commonly known as: LOTRISONE Apply 1 Application topically 2 (two) times daily.   empagliflozin 10 MG Tabs tablet Commonly known as: JARDIANCE Take 1 tablet (10 mg total) by mouth daily.   fexofenadine 180 MG tablet Commonly known as: ALLEGRA Take 180 mg by mouth in the morning.   Fish Oil 1000 MG Caps Take 1,000 mg by mouth daily.   fluticasone-salmeterol 115-21 MCG/ACT inhaler Commonly known as: ADVAIR HFA Inhale 2 puffs into the lungs 2 (two) times daily.   FreeStyle Libre 2 Reader [employer] 6 (six) sensors/90 day supply. 4 refills per year. DX:E11.8   FreeStyle Libre 2 Sensor Misc 1 Device by Does not apply route every 14 (fourteen) days.   glucose blood test strip To be used to check blood glucose Dx:E11.8 FreeStyle Precision blood glucose for Freestyle Libre 2   levothyroxine 88 MCG tablet Commonly known as: SYNTHROID TAKE 1 TABLET(88 MCG) BY MOUTH DAILY What changed: See the new instructions.   Linzess 72 MCG capsule Generic drug: linaclotide Take 72 mcg by mouth 2 (two) times daily.   MAGNESIUM PO Take 1 tablet by mouth daily.   Ocuvite Eye + Multi Tabs Take 1 tablet by mouth in the morning.   pantoprazole 40 MG tablet Commonly known as: PROTONIX TAKE 1 TABLET BY MOUTH DAILY  BEFORE BREAKFAST   pravastatin 40 MG tablet Commonly known as: PRAVACHOL Take 1 tablet (40 mg total) by mouth every evening.   senna-docusate 8.6-50 MG tablet Commonly known as: Senokot-S Take 1 tablet by mouth daily.   Sodium Fluoride 5000 Plus 1.1 % Crea dental cream Generic drug: sodium fluoride Place 1 Application onto teeth as directed.   Spiriva Respimat 1.25 MCG/ACT Aers Generic drug: Tiotropium Bromide Monohydrate Inhale 2 puffs into the lungs daily.   valACYclovir 1000 MG  tablet Commonly known as: VALTREX Take 1 tablet (1,000 mg total) by mouth 2 (two) times daily. What changed:  when to take this reasons to take this   VITAMIN D PO Take 1 tablet by mouth daily.        Discharge Exam: Filed Weights   10/31/22 0030 10/31/22 0833 11/01/22 0436  Weight: 97.6 kg 97.6 kg 97 kg   BP (!) 116/42 (BP Location: Right Arm)   Pulse 60   Temp 97.7 F (36.5 C) (Oral)   Resp 19   Ht 5'  1" (1.549 m)   Wt 97 kg   SpO2 96%   BMI 40.42 kg/m   Patient is feeling better, no dyspnea or chest pain, abdominal distention has improved.   Neurology awake and alert ENT with mild pallor Cardiovascular with S1 and S2 present and regular with no gallops, rubs or murmurs No JVD No lower extremity edema Respiratory with no rales or wheezing, no rhonchi Abdomen with no distention   Condition at discharge: stable  The results of significant diagnostics from this hospitalization (including imaging, microbiology, ancillary and laboratory) are listed below for reference.   Imaging Studies: ECHOCARDIOGRAM COMPLETE  Result Date: 10/30/2022    ECHOCARDIOGRAM REPORT   Patient Name:   Tanya Hogan Date of Exam: 10/30/2022 Medical Rec #:  [id number]       Height:       61.0 in Accession #:    [id number]      Weight:       216.7 lb Date of Birth:  17-Nov-1950        BSA:          1.954 m Patient Age:    18 years        BP:           126/64 mmHg Patient Gender: F               HR:           65 bpm. Exam Location:  Inpatient Procedure: 2D Echo, Cardiac Doppler, Color Doppler and Intracardiac            Opacification Agent Indications:    CHF-Acute Systolic [id number]  History:        Patient has prior history of Echocardiogram examinations, most                 recent 01/08/2022. CHF, Pacemaker; Risk Factors:Hypertension,                 Diabetes, Dyslipidemia and Sleep Apnea. Breast Cancer of Left                 breast.  Sonographer:    Ronny Flurry Referring Phys: [id number] [place]  1. Left ventricular ejection fraction, by estimation, is 20 to 25%. The left ventricle has severely decreased function. The left ventricle demonstrates global hypokinesis. The left ventricular internal cavity size was moderately dilated. Left ventricular diastolic parameters are consistent with Grade II diastolic dysfunction (pseudonormalization).  2. Right ventricular systolic function is moderately reduced. The right ventricular size is normal.  3. Left atrial size was severely dilated.  4. Right atrial size was moderately dilated.  5. The mitral valve is normal in structure. Mild mitral valve regurgitation. No evidence of mitral stenosis.  6. The aortic valve is tricuspid. There is mild calcification of the aortic valve. Aortic valve regurgitation is not visualized. Aortic valve sclerosis/calcification is present, without any evidence of aortic stenosis. Aortic valve area, by VTI measures  2.29 cm. Aortic valve mean gradient measures 5.0 mmHg. Aortic valve Vmax measures 1.55 m/s.  7. The inferior vena cava is normal in size with greater than 50% respiratory variability, suggesting right atrial pressure of 3 mmHg. FINDINGS  Left Ventricle: Left ventricular ejection fraction, by estimation, is 20 to 25%. The left ventricle has severely decreased function. The left ventricle demonstrates global hypokinesis. Definity contrast agent was given IV to delineate the left ventricular endocardial borders. The left ventricular internal cavity size was moderately dilated. There  is no left ventricular hypertrophy. Left ventricular diastolic parameters are consistent with Grade II diastolic dysfunction (pseudonormalization). Right Ventricle: The right ventricular size is normal. No increase in right ventricular wall thickness. Right ventricular systolic function is moderately reduced. Left Atrium: Left atrial size was severely dilated. Right Atrium: Right atrial size was moderately dilated. Pericardium:  Trivial pericardial effusion is present. The pericardial effusion is anterior to the right ventricle. Mitral Valve: The mitral valve is normal in structure. Mild mitral valve regurgitation. No evidence of mitral valve stenosis. Tricuspid Valve: The tricuspid valve is normal in structure. Tricuspid valve regurgitation is trivial. No evidence of tricuspid stenosis. Aortic Valve: The aortic valve is tricuspid. There is mild calcification of the aortic valve. Aortic valve regurgitation is not visualized. Aortic valve sclerosis/calcification is present, without any evidence of aortic stenosis. Aortic valve mean gradient measures 5.0 mmHg. Aortic valve peak gradient measures 9.6 mmHg. Aortic valve area, by VTI measures 2.29 cm. Pulmonic Valve: The pulmonic valve was normal in structure. Pulmonic valve regurgitation is trivial. No evidence of pulmonic stenosis. Aorta: The aortic root is normal in size and structure. Venous: The inferior vena cava is normal in size with greater than 50% respiratory variability, suggesting right atrial pressure of 3 mmHg. IAS/Shunts: No atrial level shunt detected by color flow Doppler. Additional Comments: A device lead is visualized.  LEFT VENTRICLE PLAX 2D LVIDd:         6.50 cm   Diastology LVIDs:         5.50 cm   LV e' medial:    4.60 cm/s LV PW:         1.30 cm   LV E/e' medial:  27.6 LV IVS:        0.90 cm   LV e' lateral:   6.25 cm/s LVOT diam:     2.10 cm   LV E/e' lateral: 20.3 LV SV:         80 LV SV Index:   41 LVOT Area:     3.46 cm  RIGHT VENTRICLE             IVC RV S prime:     12.50 cm/s  IVC diam: 1.80 cm TAPSE (M-mode): 2.8 cm LEFT ATRIUM              Index        RIGHT ATRIUM           Index LA diam:        5.50 cm  2.81 cm/m   RA Area:     21.80 cm LA Vol (A2C):   144.0 ml 73.68 ml/m  RA Volume:   69.50 ml  35.56 ml/m LA Vol (A4C):   97.5 ml  49.89 ml/m LA Biplane Vol: 122.0 ml 62.43 ml/m  AORTIC VALVE AV Area (Vmax):    2.39 cm AV Area (Vmean):   2.24 cm AV  Area (VTI):     2.29 cm AV Vmax:           155.00 cm/s AV Vmean:          104.000 cm/s AV VTI:            0.350 m AV Peak Grad:      9.6 mmHg AV Mean Grad:      5.0 mmHg LVOT Vmax:         107.00 cm/s LVOT Vmean:        67.367 cm/s LVOT VTI:  0.231 m LVOT/AV VTI ratio: 0.66  AORTA Ao Root diam: 3.30 cm Ao Asc diam:  3.10 cm MITRAL VALVE                TRICUSPID VALVE MV Area (PHT): 4.49 cm     TR Peak grad:   36.2 mmHg MV Decel Time: 169 msec     TR Vmax:        301.00 cm/s MV E velocity: 127.00 cm/s MV A velocity: 90.00 cm/s   SHUNTS MV E/A ratio:  1.41         Systemic VTI:  0.23 m                             Systemic Diam: 2.10 cm Glori Bickers MD Electronically signed by Glori Bickers MD Signature Date/Time: 10/30/2022/4:29:12 PM    Final    DG Chest 2 View  Result Date: 10/29/2022 CLINICAL DATA:  Shortness of breath EXAM: CHEST - 2 VIEW COMPARISON:  Chest x-ray 07/13/2022 and older FINDINGS: Right shoulder arthroplasty. Left upper chest pacemaker. Enlarged cardiopericardial silhouette with some vascular congestion. No pneumothorax, effusion or consolidation. Degenerative changes seen of the spine on lateral view. Films are under penetrated. IMPRESSION: Enlarged cardiopericardial silhouette with vascular congestion. Pacemaker. Electronically Signed   By: Jill Side M.D.   On: 10/29/2022 15:05    Microbiology: Results for orders placed or performed in visit on 08/10/22  WET PREP FOR [place], YEAST, CLUE     Status: Abnormal   Collection Time: 08/10/22 11:47 AM  Result Value Ref Range Status   MICRO NUMBER: [id number]  Final   Specimen Quality Adequate  Final   SOURCE: NOT GIVEN  Final   Status FINAL  Final   RESULT (A)  Final    No Trichomonas vaginalis seen. No yeast seen Clue cells seen Epithelial Cells Present   *Note: Due to a large number of results and/or encounters for the requested time period, some results have not been displayed. A complete set of results can be found in  Results Review.    Labs: CBC: Recent Labs  Lab 10/29/22 1429 10/30/22 0100  WBC 7.0 7.1  NEUTROABS 4.3  --   HGB 13.2 12.6  HCT 40.7 39.5  MCV 96.2 97.8  PLT 238 [id number]   Basic Metabolic Panel: Recent Labs  Lab 10/26/22 0734 10/29/22 1429 10/30/22 0100 10/31/22 0050 11/01/22 0040  NA 139 136 138 138 139  K 4.8 4.1 3.7 4.1 4.4  CL 101 105 108 103 106  CO2 26 24 21* 25 25  GLUCOSE 104* 120* 162* 128* 129*  BUN '17 21 16 15 20  '$ CREATININE 0.93 1.15* 1.02* 1.07* 1.10*  CALCIUM 10.6* 9.2 9.3 9.4 10.1  MG  --   --  2.0 2.0 2.3  PHOS  --   --  3.3  --   --    Liver Function Tests: Recent Labs  Lab 10/29/22 1429  AST 28  ALT 25  ALKPHOS 102  BILITOT 0.5  PROT 6.9  ALBUMIN 3.6   CBG: Recent Labs  Lab 10/31/22 0119  GLUCAP 141*    Discharge time spent: greater than 30 minutes.  Signed: Tawni Millers, MD [facility] 11/01/2022

## 2022-11-01 NOTE — Plan of Care (Signed)
Problem: Education: Goal: Knowledge of General Education information will improve Description: Including pain rating scale, medication(s)/side effects and non-pharmacologic comfort measures 11/01/2022 1046 by Archie Patten, RN Outcome: Adequate for Discharge 11/01/2022 1045 by Archie Patten, RN Outcome: Adequate for Discharge   Problem: Health Behavior/Discharge Planning: Goal: Ability to manage health-related needs will improve 11/01/2022 1046 by Archie Patten, RN Outcome: Adequate for Discharge 11/01/2022 1045 by Archie Patten, RN Outcome: Adequate for Discharge   Problem: Clinical Measurements: Goal: Ability to maintain clinical measurements within normal limits will improve 11/01/2022 1046 by Archie Patten, RN Outcome: Adequate for Discharge 11/01/2022 1045 by Archie Patten, RN Outcome: Adequate for Discharge Goal: Will remain free from infection 11/01/2022 1046 by Archie Patten, RN Outcome: Adequate for Discharge 11/01/2022 1045 by Archie Patten, RN Outcome: Adequate for Discharge Goal: Diagnostic test results will improve 11/01/2022 1046 by Archie Patten, RN Outcome: Adequate for Discharge 11/01/2022 1045 by Archie Patten, RN Outcome: Adequate for Discharge Goal: Respiratory complications will improve 11/01/2022 1046 by Archie Patten, RN Outcome: Adequate for Discharge 11/01/2022 1045 by Archie Patten, RN Outcome: Adequate for Discharge Goal: Cardiovascular complication will be avoided 11/01/2022 1046 by Archie Patten, RN Outcome: Adequate for Discharge 11/01/2022 1045 by Archie Patten, RN Outcome: Adequate for Discharge   Problem: Activity: Goal: Risk for activity intolerance will decrease 11/01/2022 1046 by Archie Patten, RN Outcome: Adequate for Discharge 11/01/2022 1045 by Archie Patten, RN Outcome: Adequate for Discharge   Problem: Nutrition: Goal: Adequate nutrition will be maintained 11/01/2022 1046 by Archie Patten, RN Outcome: Adequate for Discharge 11/01/2022 1045  by Archie Patten, RN Outcome: Adequate for Discharge   Problem: Coping: Goal: Level of anxiety will decrease 11/01/2022 1046 by Archie Patten, RN Outcome: Adequate for Discharge 11/01/2022 1045 by Archie Patten, RN Outcome: Adequate for Discharge   Problem: Elimination: Goal: Will not experience complications related to bowel motility 11/01/2022 1046 by Archie Patten, RN Outcome: Adequate for Discharge 11/01/2022 1045 by Archie Patten, RN Outcome: Adequate for Discharge Goal: Will not experience complications related to urinary retention 11/01/2022 1046 by Archie Patten, RN Outcome: Adequate for Discharge 11/01/2022 1045 by Archie Patten, RN Outcome: Adequate for Discharge   Problem: Pain Managment: Goal: General experience of comfort will improve 11/01/2022 1046 by Archie Patten, RN Outcome: Adequate for Discharge 11/01/2022 1045 by Archie Patten, RN Outcome: Adequate for Discharge   Problem: Safety: Goal: Ability to remain free from injury will improve 11/01/2022 1046 by Archie Patten, RN Outcome: Adequate for Discharge 11/01/2022 1045 by Archie Patten, RN Outcome: Adequate for Discharge   Problem: Skin Integrity: Goal: Risk for impaired skin integrity will decrease 11/01/2022 1046 by Archie Patten, RN Outcome: Adequate for Discharge 11/01/2022 1045 by Archie Patten, RN Outcome: Adequate for Discharge   Problem: Education: Goal: Ability to demonstrate management of disease process will improve 11/01/2022 1046 by Archie Patten, RN Outcome: Adequate for Discharge 11/01/2022 1045 by Archie Patten, RN Outcome: Adequate for Discharge Goal: Ability to verbalize understanding of medication therapies will improve 11/01/2022 1046 by Archie Patten, RN Outcome: Adequate for Discharge 11/01/2022 1045 by Archie Patten, RN Outcome: Adequate for Discharge Goal: Individualized Educational Video(s) 11/01/2022 1046 by Archie Patten, RN Outcome: Adequate for Discharge 11/01/2022 1045 by Archie Patten, RN Outcome: Adequate for Discharge   Problem: Activity: Goal: Capacity to carry out  activities will improve 11/01/2022 1046 by Archie Patten, RN Outcome: Adequate for Discharge 11/01/2022 1045 by Archie Patten, RN Outcome: Adequate for Discharge   Problem: Cardiac: Goal: Ability to achieve and maintain adequate cardiopulmonary perfusion will improve 11/01/2022 1046 by Archie Patten, RN Outcome: Adequate for Discharge 11/01/2022 1045 by Archie Patten, RN Outcome: Adequate for Discharge

## 2022-11-01 NOTE — Plan of Care (Signed)
PT preparing for discharge. No s/s distress. Ambulating independently and independent with ADLs. VSS. Call bell in reach and used appropriately.  Problem: Education: Goal: Knowledge of General Education information will improve Description: Including pain rating scale, medication(s)/side effects and non-pharmacologic comfort measures Outcome: Adequate for Discharge   Problem: Health Behavior/Discharge Planning: Goal: Ability to manage health-related needs will improve Outcome: Adequate for Discharge   Problem: Clinical Measurements: Goal: Ability to maintain clinical measurements within normal limits will improve Outcome: Adequate for Discharge Goal: Will remain free from infection Outcome: Adequate for Discharge Goal: Diagnostic test results will improve Outcome: Adequate for Discharge Goal: Respiratory complications will improve Outcome: Adequate for Discharge Goal: Cardiovascular complication will be avoided Outcome: Adequate for Discharge   Problem: Activity: Goal: Risk for activity intolerance will decrease Outcome: Adequate for Discharge   Problem: Nutrition: Goal: Adequate nutrition will be maintained Outcome: Adequate for Discharge   Problem: Coping: Goal: Level of anxiety will decrease Outcome: Adequate for Discharge   Problem: Elimination: Goal: Will not experience complications related to bowel motility Outcome: Adequate for Discharge Goal: Will not experience complications related to urinary retention Outcome: Adequate for Discharge   Problem: Pain Managment: Goal: General experience of comfort will improve Outcome: Adequate for Discharge   Problem: Safety: Goal: Ability to remain free from injury will improve Outcome: Adequate for Discharge   Problem: Skin Integrity: Goal: Risk for impaired skin integrity will decrease Outcome: Adequate for Discharge   Problem: Education: Goal: Ability to demonstrate management of disease process will  improve Outcome: Adequate for Discharge Goal: Ability to verbalize understanding of medication therapies will improve Outcome: Adequate for Discharge Goal: Individualized Educational Video(s) Outcome: Adequate for Discharge   Problem: Activity: Goal: Capacity to carry out activities will improve Outcome: Adequate for Discharge   Problem: Cardiac: Goal: Ability to achieve and maintain adequate cardiopulmonary perfusion will improve Outcome: Adequate for Discharge

## 2022-11-01 NOTE — Progress Notes (Signed)
Nsg Discharge Note  Admit Date:  10/29/2022 Discharge date: 11/01/2022   Tanya Hogan to be D/C'd Home per MD order.  AVS completed.  Patient/caregiver able to verbalize understanding.  Discharge Medication: Allergies as of 11/01/2022       Reactions   Injectafer [ferric Carboxymaltose] Shortness Of Breath   Mild SOB following injectafer infusion Mild SOB following injectafer infusion   Penicillins Anaphylaxis, Rash   Has patient had a PCN reaction causing immediate rash, facial/tongue/throat swelling, SOB or lightheadedness with hypotension: Yes Has patient had a PCN reaction causing severe rash involving mucus membranes or skin necrosis: No Has patient had a PCN reaction that required hospitalization: No Has patient had a PCN reaction occurring within the last 10 years: No If all of the above answers are "NO", then may proceed with Cephalosporin use. "Stuttered with medication"   Accupril [quinapril Hcl] Other (See Comments)   Hyperkalemia=high potassium level   Aleve [naproxen] Swelling   Celebrex [celecoxib]    ulcers   Cozaar [losartan Potassium] Itching   Flonase [fluticasone] Other (See Comments)   Nasal ulcer.     Levomenol Other (See Comments)   unknown unknown   Lipitor [atorvastatin] Other (See Comments)   muscle aches/pains   Nasonex [mometasone Furoate] Other (See Comments)   nosebleed   Xyzal [levocetirizine] Other (See Comments)   Doesn't recall reaction type   Bentyl [dicyclomine]    Chamomile Other (See Comments)   unknown   Entresto [sacubitril-valsartan]    Hyperkalemia   Jardiance [empagliflozin]    Dehydrated, kidney dysfunction per patient   Vesicare [solifenacin] Other (See Comments)   "stopped me from urinating"   Aspirin Other (See Comments)   Pt states "burns holes in stomach", ulcers    Baking Soda-fluoride [sodium Fluoride] Itching, Rash   Clindamycin/lincomycin Rash   Codeine Nausea And Vomiting   Furosemide Other (See Comments)   Nose  bleed   Lactose Intolerance (gi) Other (See Comments)   Increased calcium, sneezing, nasal issues   Lanolin-petrolatum Rash   Metformin And Related Diarrhea   Miralax [polyethylene Glycol] Rash   Quinine Rash   Reclast [zoledronic Acid] Rash, Other (See Comments)   shaking   Rifampin Other (See Comments)   DOES NOT REMEMBER THIS MEDICATION OR ALLERGY   Sulfa Antibiotics Rash   Sulfonamide Derivatives Rash   Tramadol Rash   Anxiety   Wellbutrin [bupropion] Other (See Comments)   Muscle aches, pain, "made my skin crawl"   Wool Alcohol [lanolin] Rash        Medication List     STOP taking these medications    metFORMIN 500 MG 24 hr tablet Commonly known as: GLUCOPHAGE-XR       TAKE these medications    albuterol (2.5 MG/3ML) 0.083% nebulizer solution Commonly known as: PROVENTIL Take 2.5 mg by nebulization every 6 (six) hours as needed for wheezing or shortness of breath.   albuterol 108 (90 Base) MCG/ACT inhaler Commonly known as: VENTOLIN HFA Inhale 1-2 puffs into the lungs every 6 (six) hours as needed for wheezing or shortness of breath.   alendronate 70 MG tablet Commonly known as: FOSAMAX TAKE 1 TABLET BY MOUTH  EVERY 7 DAYS WITH A FULL  GLASS OF WATER ON AN EMPTY  STOMACH What changed: See the new instructions.   AMBULATORY NON FORMULARY MEDICATION Medication Name: Tubing and supplies for nebulizer. Fax - [phone]   amiodarone 200 MG tablet Commonly known as: PACERONE TAKE  [place]  THRU THURSDAY  TAKE  200 MG TWICE A DAY FRIDAY THROUGH SUNDAY What changed:  how much to take how to take this when to take this additional instructions   aspirin 81 MG chewable tablet Chew 81 mg by mouth daily.   bumetanide 1 MG tablet Commonly known as: Bumex Take 1 tablet (1 mg total) by mouth 2 (two) times daily. Take an extra tablet in the morning, if weight gain 2 to 3 lbs in 24 hrs or 5 lbs in 7 days. What changed: additional instructions    carvedilol 6.25 MG tablet Commonly known as: COREG Take 0.5 tablets (3.125 mg total) by mouth in the morning and at bedtime. What changed: how much to take   clotrimazole-betamethasone cream Commonly known as: LOTRISONE Apply 1 Application topically 2 (two) times daily.   empagliflozin 10 MG Tabs tablet Commonly known as: JARDIANCE Take 1 tablet (10 mg total) by mouth daily.   fexofenadine 180 MG tablet Commonly known as: ALLEGRA Take 180 mg by mouth in the morning.   Fish Oil 1000 MG Caps Take 1,000 mg by mouth daily.   fluticasone-salmeterol 115-21 MCG/ACT inhaler Commonly known as: ADVAIR HFA Inhale 2 puffs into the lungs 2 (two) times daily.   FreeStyle Libre 2 Reader [employer] 6 (six) sensors/90 day supply. 4 refills per year. DX:E11.8   FreeStyle Libre 2 Sensor Misc 1 Device by Does not apply route every 14 (fourteen) days.   glucose blood test strip To be used to check blood glucose Dx:E11.8 FreeStyle Precision blood glucose for Freestyle Libre 2   levothyroxine 88 MCG tablet Commonly known as: SYNTHROID TAKE 1 TABLET(88 MCG) BY MOUTH DAILY What changed: See the new instructions.   Linzess 72 MCG capsule Generic drug: linaclotide Take 72 mcg by mouth 2 (two) times daily.   MAGNESIUM PO Take 1 tablet by mouth daily.   Ocuvite Eye + Multi Tabs Take 1 tablet by mouth in the morning.   pantoprazole 40 MG tablet Commonly known as: PROTONIX TAKE 1 TABLET BY MOUTH DAILY  BEFORE BREAKFAST   pravastatin 40 MG tablet Commonly known as: PRAVACHOL Take 1 tablet (40 mg total) by mouth every evening.   senna-docusate 8.6-50 MG tablet Commonly known as: Senokot-S Take 1 tablet by mouth daily.   Sodium Fluoride 5000 Plus 1.1 % Crea dental cream Generic drug: sodium fluoride Place 1 Application onto teeth as directed.   Spiriva Respimat 1.25 MCG/ACT Aers Generic drug: Tiotropium Bromide Monohydrate Inhale 2 puffs into the lungs daily.   valACYclovir 1000 MG  tablet Commonly known as: VALTREX Take 1 tablet (1,000 mg total) by mouth 2 (two) times daily. What changed:  when to take this reasons to take this   VITAMIN D PO Take 1 tablet by mouth daily.        Discharge Assessment: Vitals:   11/01/22 0900 11/01/22 1026  BP:  (!) 126/51  Pulse: 66 62  Resp: 18   Temp:    SpO2: 97%    Skin clean, dry and intact without evidence of skin break down, no evidence of skin tears noted. IV catheter discontinued intact. Site without signs and symptoms of complications - no redness or edema noted at insertion site, patient denies c/o pain - only slight tenderness at site.  Dressing with slight pressure applied.  D/c Instructions-Education: Discharge instructions given to patient/family with verbalized understanding. D/c education completed with patient/family including follow up instructions, medication list, d/c activities limitations if indicated, with other d/c instructions as indicated  by MD - patient able to verbalize understanding, all questions fully answered. Patient instructed to return to ED, call 911, or call MD for any changes in condition.  Patient escorted via [place], and D/C home via private auto.  Atilano Ina, RN 11/01/2022 11:55 AM

## 2022-11-02 ENCOUNTER — Encounter: Payer: Self-pay | Admitting: *Deleted

## 2022-11-02 ENCOUNTER — Telehealth: Payer: Self-pay | Admitting: *Deleted

## 2022-11-02 NOTE — Transitions of Care (Post Inpatient/ED Visit) (Signed)
   11/02/2022  Name: Tanya Hogan MRN: [id number] DOB: 08-May-1951  Today's TOC FU Call Status: Today's TOC FU Call Status:: Unsuccessul Call (1st Attempt) Unsuccessful Call (1st Attempt) Date: 11/02/22  Attempted to reach the patient regarding the most recent Inpatient visit; left HIPAA compliant voice message requesting call back  Follow Up Plan: Additional outreach attempts will be made to reach the patient to complete the Transitions of Care (Post Inpatient visit) call.   [name], RN, BSN, CCRN Alumnus RN CM Care Coordination/ Transition of [place] Management [phone]: direct office

## 2022-11-03 ENCOUNTER — Ambulatory Visit (INDEPENDENT_AMBULATORY_CARE_PROVIDER_SITE_OTHER): Payer: 59

## 2022-11-03 ENCOUNTER — Encounter: Payer: Self-pay | Admitting: *Deleted

## 2022-11-03 ENCOUNTER — Telehealth: Payer: Self-pay | Admitting: *Deleted

## 2022-11-03 DIAGNOSIS — I428 Other cardiomyopathies: Secondary | ICD-10-CM | POA: Diagnosis not present

## 2022-11-03 NOTE — Transitions of Care (Post Inpatient/ED Visit) (Signed)
   11/03/2022  Name: Tanya Hogan MRN: [id number] DOB: [birth year]  Today's TOC FU Call Status: Today's TOC FU Call Status:: Successful TOC FU Call Competed TOC FU Call Complete Date: 11/03/22  Transition Care Management Follow-up Telephone Call Date of Discharge: 11/01/22 Discharge Facility: [facility]) Type of Discharge: Inpatient Admission Primary Inpatient Discharge Diagnosis:: Acute on chronic CHF exacerbation How have you been since you were released from the hospital?: Same Any questions or concerns?: No  Items Reviewed: Did you receive and understand the discharge instructions provided?: Yes (thoroughly reviewed with patient who verbalizes good understanding of same) Medications obtained and verified?: Yes (Medications Reviewed) (full medication review completed; no concerns or discrepancies identified; confirmed patient obtained/ is taking all newly Rx'd medications as instructed; self-manages medications and denies questions/ concerns around medications today) Any new allergies since your discharge?: No Dietary orders reviewed?: Yes Type of Diet Ordered:: Heart Healthy/ low salt/ diabetic Do you have support at home?: Yes People in Home: sibling(s) Name of Support/Comfort Primary Source: patient reports she is independent in self-care activities; resides with her brother  [place] and Equipment/Supplies: [place] Ordered?: No Any new equipment or medical supplies ordered?: No  Functional Questionnaire: Do you need assistance with bathing/showering or dressing?: No Do you need assistance with meal preparation?: No Do you need assistance with eating?: No Do you have difficulty maintaining continence: No Do you need assistance with getting out of bed/getting out of a chair/moving?: No Do you have difficulty managing or taking your medications?: No  Folllow up appointments reviewed: PCP Follow-up appointment confirmed?: Yes Date of PCP follow-up  appointment?: 11/04/22 Follow-up Provider: PCP, Dr. Suzi Roots Specialist [facility] Follow-up appointment confirmed?: Yes Date of Specialist follow-up appointment?: 11/24/22 Follow-Up Specialty Provider:: cardiology provider Do you need transportation to your follow-up appointment?: No Do you understand care options if your condition(s) worsen?: Yes-patient verbalized understanding  SDOH Interventions Today    Flowsheet Row Most Recent Value  SDOH Interventions   Food Insecurity Interventions Intervention Not Indicated  Transportation Interventions Intervention Not Indicated  [reports drives self,  friends/ family assists as/ if needed]      TOC Interventions Today    Flowsheet Row Most Recent Value  TOC Interventions   TOC Interventions Discussed/Reviewed TOC Interventions Discussed      Interventions Today    Flowsheet Row Most Recent Value  Chronic Disease   Chronic disease during today's visit Congestive Heart Failure (CHF)  General Interventions   General Interventions Discussed/Reviewed General Interventions Discussed, Doctor Visits, Communication with  Doctor Visits Discussed/Reviewed Doctor Visits Discussed, PCP, Specialist  PCP/Specialist Visits Compliance with follow-up visit  Communication with RN  [verified CCM RN CM currently active]  Education Interventions   Education Provided Provided Education  Provided Verbal Education On Medication, When to see the doctor, Other  [process to have new order written for "updated [place], " advised to discuss with Dr. Suzi Roots during tomorrow's office visit for hospital follow up]  Nutrition Interventions   Nutrition Discussed/Reviewed Nutrition Discussed, Decreasing salt  Pharmacy Interventions   Pharmacy Dicussed/Reviewed Pharmacy Topics Discussed  [full medication review completed]      [name], RN, BSN, CCRN Alumnus RN CM Care Coordination/ Transition of [place] Management [phone]: direct office

## 2022-11-04 ENCOUNTER — Encounter: Payer: Self-pay | Admitting: Family Medicine

## 2022-11-04 ENCOUNTER — Ambulatory Visit (INDEPENDENT_AMBULATORY_CARE_PROVIDER_SITE_OTHER): Payer: 59 | Admitting: Family Medicine

## 2022-11-04 VITALS — BP 139/68 | HR 75 | Ht 61.0 in | Wt 216.0 lb

## 2022-11-04 DIAGNOSIS — I5023 Acute on chronic systolic (congestive) heart failure: Secondary | ICD-10-CM

## 2022-11-04 DIAGNOSIS — R682 Dry mouth, unspecified: Secondary | ICD-10-CM

## 2022-11-04 DIAGNOSIS — E785 Hyperlipidemia, unspecified: Secondary | ICD-10-CM | POA: Diagnosis not present

## 2022-11-04 DIAGNOSIS — I10 Essential (primary) hypertension: Secondary | ICD-10-CM | POA: Diagnosis not present

## 2022-11-04 DIAGNOSIS — E1169 Type 2 diabetes mellitus with other specified complication: Secondary | ICD-10-CM | POA: Diagnosis not present

## 2022-11-04 DIAGNOSIS — K5909 Other constipation: Secondary | ICD-10-CM

## 2022-11-04 DIAGNOSIS — B001 Herpesviral vesicular dermatitis: Secondary | ICD-10-CM

## 2022-11-04 MED ORDER — VALACYCLOVIR HCL 1 G PO TABS: 1000.0000 mg | ORAL_TABLET | Freq: Two times a day (BID) | ORAL | 2 refills | Status: DC

## 2022-11-04 NOTE — Progress Notes (Signed)
Established Patient Office Visit  Subjective   Patient ID: LIZMAR JUNOT, female    DOB: Jun 19, 1951  Age: 72 y.o. MRN: [id number]  Chief Complaint  Patient presents with   Hospitalization Follow-up    HPI  She is here today for hospital follow-up she was admitted to [facility] health on March 7 and discharged home 3 days later on March 10.  She was admitted for acute on chronic systolic congestive heart failure she had rapidly gained about 15 pounds over 3-day.  Despite trying to increase her diuretics at home.  They did place her on an SGL 2 and had her continue the bumetanide 1 mg twice a day at home instructed to take 1 extra tab in case volume overload of 2 to 3 pounds within a 24-hour.  Or 5 pounds within a 7-day.  They recommended continuing her metformin.  She still feels tired and still noticing some shortness of breath and fatigue.  She is taking the Jardiance.  She feels like her mouth is dry but she still feels very bloated in her abdomen she has not been able to have a good bowel movement she is been taking her Linzess.  She does feel swollen intermittently.  Also has a cold sore on her left lateral mouth.    Brought in home log for BP, weights.      ROS    Objective:     BP 139/68   Pulse 75   Ht '5\' 1"'$  (1.549 m)   Wt 216 lb (98 kg)   SpO2 97%   BMI 40.81 kg/m    Physical Exam Vitals and nursing note reviewed.  Constitutional:      Appearance: She is well-developed.  HENT:     Head: Normocephalic and atraumatic.  Cardiovascular:     Rate and Rhythm: Normal rate and regular rhythm.     Heart sounds: Normal heart sounds.  Pulmonary:     Effort: Pulmonary effort is normal.     Breath sounds: Normal breath sounds.  Skin:    General: Skin is warm and dry.  Neurological:     Mental Status: She is alert and oriented to person, place, and time.  Psychiatric:        Behavior: Behavior normal.      No results found for any visits on 11/04/22.    The ASCVD  Risk score (Arnett DK, et al., 2019) failed to calculate for the following reasons:   The patient has a prior MI or stroke diagnosis    Assessment & Plan:   Problem List Items Addressed This Visit       Cardiovascular and Mediastinum   Essential hypertension    Her blood pressure is slightly elevated. She will continue her current medications.       Acute on chronic systolic CHF (congestive heart failure) ([place]) - Primary    Now on Jardiance.  I am still questioning her home dry weight. Based on home logs I would say 214, . Patient did not tolerate MRA, ARB or ACE inh due to hyperkalemia.   Continue carvedilol  Continue to weigh daily Take extra Bumex if up 2 lbs from dry weight.  F/U in 3 weeks. Check BMP today.       Relevant Orders   BASIC METABOLIC PANEL WITH GFR     Digestive   Chronic constipation     Endocrine   Type 2 diabetes mellitus with hyperlipidemia ([place])    Ok to restart 0.25  mg Ozempic weekly. F/U in e weeks. She has a supply at home      Relevant Medications   Semaglutide,0.25 or 0.'5MG'$ /DOS, (OZEMPIC, 0.25 OR 0.5 MG/DOSE,) 2 MG/1.5ML SOPN   Other Visit Diagnoses     Cold sore       Relevant Medications   valACYclovir (VALTREX) 1000 MG tablet   Dry mouth          Chronic constipation - recommend starting her senokot since already taking her linzess.    Return in about 3 weeks (around 11/25/2022) for heart failure.    Beatrice Lecher, MD

## 2022-11-04 NOTE — Assessment & Plan Note (Addendum)
Now on Jardiance.  I am still questioning her home dry weight. Based on home logs I would say 214, . Patient did not tolerate MRA, ARB or ACE inh due to hyperkalemia.   Continue carvedilol  Continue to weigh daily Take extra Bumex if up 2 lbs from dry weight.  F/U in 3 weeks. Check BMP today.

## 2022-11-04 NOTE — Assessment & Plan Note (Signed)
Ok to restart 0.25 mg Ozempic weekly. F/U in e weeks. She has a supply at home

## 2022-11-04 NOTE — Assessment & Plan Note (Signed)
Her blood pressure is slightly elevated. She will continue her current medications. 

## 2022-11-05 LAB — CUP PACEART REMOTE DEVICE CHECK
Battery Remaining Longevity: 87 mo
Battery Voltage: 3.02 V
Brady Statistic AP VP Percent: 77.92 %
Brady Statistic AP VS Percent: 0.15 %
Brady Statistic AS VP Percent: 20.02 %
Brady Statistic AS VS Percent: 1.91 %
Brady Statistic RA Percent Paced: 79.35 %
Brady Statistic RV Percent Paced: 97.95 %
Date Time Interrogation Session: 20240311191404
Implantable Lead Connection Status: 753985
Implantable Lead Connection Status: 753985
Implantable Lead Connection Status: 753985
Implantable Lead Implant Date: 20030324
Implantable Lead Implant Date: 20030324
Implantable Lead Implant Date: 20230313
Implantable Lead Location: 753858
Implantable Lead Location: 753859
Implantable Lead Location: 753860
Implantable Lead Model: 3830
Implantable Lead Model: 4068
Implantable Lead Model: 4193
Implantable Pulse Generator Implant Date: 20230313
Lead Channel Impedance Value: 3306 Ohm
Lead Channel Impedance Value: 3306 Ohm
Lead Channel Impedance Value: 3306 Ohm
Lead Channel Impedance Value: 380 Ohm
Lead Channel Impedance Value: 399 Ohm
Lead Channel Impedance Value: 456 Ohm
Lead Channel Impedance Value: 494 Ohm
Lead Channel Impedance Value: 741 Ohm
Lead Channel Impedance Value: 855 Ohm
Lead Channel Pacing Threshold Amplitude: 0.625 V
Lead Channel Pacing Threshold Amplitude: 2 V
Lead Channel Pacing Threshold Pulse Width: 0.4 ms
Lead Channel Pacing Threshold Pulse Width: 0.8 ms
Lead Channel Sensing Intrinsic Amplitude: 1.125 mV
Lead Channel Sensing Intrinsic Amplitude: 1.125 mV
Lead Channel Sensing Intrinsic Amplitude: 16.125 mV
Lead Channel Sensing Intrinsic Amplitude: 16.125 mV
Lead Channel Setting Pacing Amplitude: 2 V
Lead Channel Setting Pacing Amplitude: 2.5 V
Lead Channel Setting Pacing Amplitude: 2.5 V
Lead Channel Setting Pacing Pulse Width: 0.4 ms
Lead Channel Setting Pacing Pulse Width: 0.8 ms
Lead Channel Setting Sensing Sensitivity: 2.8 mV
Zone Setting Status: 755011
Zone Setting Status: 755011

## 2022-11-05 LAB — BASIC METABOLIC PANEL WITH GFR
BUN: 19 mg/dL (ref 7–25)
CO2: 25 mmol/L (ref 20–32)
Calcium: 10.3 mg/dL (ref 8.6–10.4)
Chloride: 105 mmol/L (ref 98–110)
Creat: 0.92 mg/dL (ref 0.60–1.00)
Glucose, Bld: 141 mg/dL — ABNORMAL HIGH (ref 65–99)
Potassium: 4.9 mmol/L (ref 3.5–5.3)
Sodium: 141 mmol/L (ref 135–146)
eGFR: 66 mL/min/{1.73_m2} (ref >=60)

## 2022-11-05 NOTE — Progress Notes (Signed)
Call patient and let her know that the kidney function looks stable.  I want her to take an extra Bumex today or tomorrow.  And then let us know what her weight is on Monday, on her home scale

## 2022-11-09 ENCOUNTER — Encounter: Payer: Self-pay | Admitting: Family Medicine

## 2022-11-23 DIAGNOSIS — G4733 Obstructive sleep apnea (adult) (pediatric): Secondary | ICD-10-CM | POA: Diagnosis not present

## 2022-11-23 DIAGNOSIS — G4737 Central sleep apnea in conditions classified elsewhere: Secondary | ICD-10-CM | POA: Diagnosis not present

## 2022-11-23 NOTE — Progress Notes (Unsigned)
Office Visit    Patient Name: Tanya Hogan Date of Encounter: 09/29/2021  PCP:  Hali Marry, MD   [facility]  Cardiologist:  Cristopher Peru, MD  Advanced Practice Provider:  No care team member to display Electrophysiologist:  None    HPI    Tanya Hogan is a 72 y.o. female with a hx of chronic systolic heart failure and CHB status post biventricular PPM, depression, GERD, LBBB, diabetes mellitus without complication, hypertension, hypothyroidism presents today for follow-up.  The patient had breast cancer and was status post meniscectomy.  She has been hospital for CHF exacerbation early 2022.  She had an SVT was placed on amiodarone which she did not tolerate but does tolerate 100 mg daily.  She was last seen in the office April 2022 by Dr. Lovena Le at that time she was not having any angina.  Her left heart cath demonstrated no CAD.  Her EF was 35 to 40% by echocardiogram.  She remained on Coreg and amiodarone.  She had been intolerant to ACE inhibitor.  At that time no medication changes were made and she appeared euvolemic on exam.  Patient was recently in the ED over at [employer] on 09/12/2021.  Noted to be in decompensated heart failure with a BNP of 4700 and chest x-ray showing evidence of vascular congestion with bilateral pleural effusions.  She is started on Bumex twice daily with improvement of symptoms.  She initially was requiring 3 L of oxygen was able to be weaned down to room air.  Repeat echocardiogram showed EF of 20%.  Patient achieved euvolemia and was started on spironolactone and Jardiance at discharge.  She was last seen by me early February 2023 and at that time she felt like she was being held down and had no energy. She had felt much better after discontinuing spirolactone. She continued to have weight loss since starting on Ozempic. She has not noticed any significant edema in her legs. She states she does try to maintain a  low-sodium, heart healthy diet however it is challenging because her diet is already restricted with her IBS. She also has many medication intolerances and allergies. She was willing however, to try Coreg at the lowest dose. She was also interested in participating in cardiac rehab as long as her insurance pays for it. We discussed getting her in to see a nutritionist as well which she seemed interested in.  She was seen 10/13/2021 and continued to have dizziness/lightheadedness.  On Saturday she was walking around [employer] trying to get some steps and and felt fine during and after.  However, on Sunday she was down the entire day and felt dizzy and unwell.  She stated that her back was extremely sore as well and she sees Dr. Lonia Mad for this.  She states she was still having occasional shortness of breath.  Her weight has been stable.  We discussed stopping diltiazem since her EF is still low and this will also help bring up her blood pressure a bit.  Her blood pressure was low normal and I encouraged her to take it at home and record those values.  I asked her to take her blood pressure at the same time every day.  She seemed to be tolerating the Coreg that we started her on last time well.  We started her on the lowest dose 3.125mg .   I think she would really benefit from rehab because I think at least some  of her symptoms are deconditioning related.  She sees her primary care tomorrow and will likely get labs at that time.  Recommend a CMP to check liver and kidney function.  The only medication that might have a slight effect on her liver is the amiodarone but she is only on 100 mg a day which is a very low dose.  She also states that she is on BiPAP at night and occasionally wakes up short of breath and has to take her albuterol.  Asked her to discuss this with her primary care since her BiPAP settings may need adjusting.  She was seen by me 10/27/21.  She was seen the previous Friday by Dr. Lovena Le and he  recommended left bundle area pacing. She plans to have the procedure done next week and hopes this will make her feel better. We reviewed her labs and changed her potassium supplement to every other day. She is encouraged to take mucinex and allegra for her nasal congestion and cough, without the decongestive portion. She mentioned that she has been eating lots of fruits and vegetables. She states that she has had some BP numbers that were in the [id number] systolic a few days in a row, but they have been better. Today BP was 110/72. Would be hesitant to decrease her diuretics since she has been on that dose of Bumex for a long time and appears euvolemic on exam today. When she misses a dose she had trouble breathing and has increased edema in her lower legs.   She was seen by Dr. Johney Frame 12/22/2021 and has been doing a lot better.  She was reportedly taking Bumex twice a day.  If her ankles become round and puffy she takes an extra dose.  Her weight has been stable ranging from 188 to 193 pounds.  She has been doing intermittent fasting and is now up to 18 hours of fasting in the time.  Increasing her fluid during that time.   She was seen by me 03/03/22, and she felt okay from cardiovascular standpoint.  Her IBS has been giving her a hard time.  She is still doing intermittent fasting and tries to do 12 hours at a time, then the most she will do is 18 hours.  She has been going to the gym in the mornings on an empty stomach and that seems to work well for her.  Her blood glucose level remains in control when she does this.  She states she has had some weight gain and she attributes this to edema, gas, and stool.  On exam today she did not particularly seem fluid overloaded however, since she occasionally does get lower extremity edema we have provided her with a as needed Bumex prescription.  She was admitted to [facility] 1/5 through 09/03/2022 with arrhythmia (suspected primary diagnosis VT), hypokalemia, elevated  troponin to peak 171, prolonged QT interval, and CHF.  She did not pass out.  Per review of her discharge summary, patient presented with V. tach and converted to paced rhythm with IV amiodarone.  Her Coreg was increased and her amiodarone was uptitrated during the visit.  GDMT has been limited due to significant intolerances including hyperkalemia with ARB/Entresto, worsening AKI with Farxiga, orthostasis with spironolactone.  Repeat echo 08/29/2022 showed EF 25 to 30%, severe HK of LV, diastolic dysfunction, severe LAE, moderate MR, mild to moderate TR, moderate elevation of RVSP.  Discharged on higher dose of amiodarone and carvedilol as well as new Entresto with recommendation of  follow-up with EP.  Elevated troponin was felt to be due to demand ischemia.  She was seen in follow-up by Tommye Standard on 09/25/2022 where she was doing better from a CV standpoint.  She was seen by Dr. Johney Frame 10/09/2022 and at that time had some occasional flutters but no sustained events.  Been off Entresto due to hyperkalemia.  Overall was feeling weak.  Did have significant dyspnea on exertion.  Weight was stable to 10 to 13 pounds over the past couple of weeks.  She did feel like she was retaining fluid.  Also, having GI symptoms which has been attributing to her low K diet.  Today, she ***  Today, she ***    Past Medical History    Past Medical History:  Diagnosis Date   Alkaline phosphatase elevation    Asthma    Breast cancer ([place]) 2011   Carpal tunnel syndrome    Carpal tunnel syndrome    bilateral   CHF (congestive heart failure) (HCC)    Cirrhosis, non-alcoholic (HCC)    Closed fracture of unspecified part of radius (alone)    DDD (degenerative disc disease), lumbar    Depression    pt denies   Diabetes mellitus without complication ([place])    Enlarged heart    Fibromyalgia    GERD (gastroesophageal reflux disease)    History of kidney stones    HTN (hypertension)    Hypothyroidism    IBS  (irritable bowel syndrome)    LBBB (left bundle branch block)    Microcytic anemia 05/14/2017   Pneumonia    Presence of permanent cardiac pacemaker    PVD (peripheral vascular disease) ([place])    2019   Sleep apnea    uses cpap   Past Surgical History:  Procedure Laterality Date   BREAST LUMPECTOMY Left 2011   COLONOSCOPY     FOOT SURGERY     KNEE SURGERY     MASTECTOMY Right    05/2019   PACEMAKER GENERATOR CHANGE N/A 08/23/2014   Procedure: PACEMAKER GENERATOR CHANGE;  Surgeon: Evans Lance, MD;  Location: [facility] C[facility] LAB;  Service: Cardiovascular;  Laterality: N/A;   PACEMAKER PLACEMENT  2009   [facility]   RECTAL SURGERY  1976   REDUCTION MAMMAPLASTY Left    05/2019   TOTAL SHOULDER ARTHROPLASTY Right 03/10/2018   TOTAL SHOULDER ARTHROPLASTY Right 03/10/2018   Procedure: RIGHT TOTAL SHOULDER ARTHROPLASTY;  Surgeon: [name], MD;  Location: [facility];  Service: Orthopedics;  Laterality: Right;   TUBAL LIGATION      Allergies  Allergies  Allergen Reactions   Ferric Carboxymaltose Shortness Of Breath    Mild SOB following injectafer infusion Mild SOB following injectafer infusion    Penicillins Anaphylaxis    Has patient had a PCN reaction causing immediate rash, facial/tongue/throat swelling, SOB or lightheadedness with hypotension: Yes Has patient had a PCN reaction causing severe rash involving mucus membranes or skin necrosis: No Has patient had a PCN reaction that required hospitalization: No Has patient had a PCN reaction occurring within the last 10 years: No If all of the above answers are "NO", then may proceed with Cephalosporin use.    Accupril [Quinapril Hcl] Other (See Comments)    Hyperkalemia   Atorvastatin Other (See Comments)    msucle aches   Celebrex [Celecoxib]     ulcers   Fluticasone Other (See Comments)    Nasal ulcer.     Levocetirizine     Doesn't recall  Levomenol Other (See Comments)    unknown unknown    Losartan  Potassium Itching   Naproxen Swelling   Nasonex [Mometasone Furoate] Other (See Comments)    nosebleed   Chamomile Other (See Comments)    unknown   Vesicare [Solifenacin] Other (See Comments)    "stopped me from urinating"   Aspirin Other (See Comments)    Pt states "burns holes in stomach", ulcers    Baking Soda-Fluoride [Sodium Fluoride] Itching   Clindamycin/Lincomycin Rash   Codeine Nausea And Vomiting   Furosemide Other (See Comments)    Nose bleed   Lactose    Lactose Intolerance (Gi) Other (See Comments)    Increased calcium, sneezing, nasal issues   Lanolin-Petrolatum Rash   Metformin And Related Diarrhea   Miralax [Polyethylene Glycol 3350]    Miralax [Polyethylene Glycol] Rash   Nasonex [Mometasone Furoate] Other (See Comments)   Quinine Rash   Rifampin Other (See Comments)    DOES NOT REMEMBER THIS MEDICATION OR ALLERGY   Sulfa Antibiotics Rash   Sulfonamide Derivatives Rash   Tramadol Rash    Anxiety   Wellbutrin [Bupropion] Other (See Comments)    Muscle aches, pain, "made my skin crawl"   Wool Alcohol [Lanolin] Rash   Zoledronic Acid Other (See Comments) and Rash    shaking     EKGs/Labs/Other Studies Reviewed:   The following studies were reviewed today:  Echocardiogram 09/21/2021   The study was technically difficult.    Left Ventricle: Left ventricle is mildly dilated.Systolic function is  severely abnormal. EF: 20 %.    Right Ventricle: Right ventricle is mildly dilated. Systolic function  is moderately reduced. Abnormal tricuspid annular plane systolic excursion  (TAPSE) <1.7 cm.    Left Atrium: Left atrium is mildly dilated.    Mitral Valve: There is mild regurgitation with a centrally directed  jet.    Tricuspid Valve: The right ventricular systolic pressure is mildly  elevated (37-49 mmHg).   EKG:  EKG is not ordered today.   Recent Labs: 04/16/2021: ALT 18; Hemoglobin 12.2; Platelets 299 08/14/2021: TSH 2.54 09/23/2021: BUN 15; Creat  1.12; Potassium 4.2; Sodium 141  Recent Lipid Panel    Component Value Date/Time   CHOL 168 04/16/2021 0000   TRIG 120 04/16/2021 0000   HDL 73 04/16/2021 0000   CHOLHDL 2.3 04/16/2021 0000   VLDL 26 02/18/2017 0939   LDLCALC 74 04/16/2021 0000     Home Medications   Current Meds  Medication Sig   albuterol (PROVENTIL) (2.5 MG/3ML) 0.083% nebulizer solution Take 2.5 mg by nebulization every 6 (six) hours as needed for wheezing or shortness of breath.   albuterol (VENTOLIN HFA) 108 (90 Base) MCG/ACT inhaler Inhale 1-2 puffs into the lungs every 6 (six) hours as needed for wheezing or shortness of breath.   alendronate (FOSAMAX) 70 MG tablet TAKE 1 TABLET BY MOUTH  EVERY 7 DAYS WITH A FULL  GLASS OF WATER ON AN EMPTY  STOMACH   ALLEGRA-D ALLERGY & CONGESTION 60-120 MG 12 hr tablet    AMBULATORY NON FORMULARY MEDICATION Medication Name: Tubing and supplies for nebulizer. Fax - [phone]   amiodarone (PACERONE) 200 MG tablet TAKE ONE-HALF TABLET BY  MOUTH DAILY   B Complex-C (B-COMPLEX WITH VITAMIN C) tablet Take 1 tablet by mouth daily.   bumetanide (BUMEX) 1 MG tablet TAKE 1 TABLET BY MOUTH  TWICE DAILY   Calcium Citrate-Vitamin D (CITRACAL + D PO) Take 1 tablet by mouth daily.   carvedilol (  COREG) 3.125 MG tablet Take 1 tablet (3.125 mg total) by mouth 2 (two) times daily.   Continuous Blood Gluc Receiver (FREESTYLE LIBRE 2 READER) DEVI by Does not apply route.   Continuous Blood Gluc Sensor (FREESTYLE LIBRE 2 SENSOR) MISC by Does not apply route.   diltiazem (CARDIZEM CD) 180 MG 24 hr capsule TAKE 1 CAPSULE BY MOUTH  DAILY   levothyroxine (SYNTHROID) 88 MCG tablet TAKE 1 TABLET(88 MCG) BY MOUTH DAILY   LINZESS 72 MCG capsule Take 72 mcg by mouth 2 (two) times daily.   Multiple Vitamins-Minerals (OCUVITE EYE + MULTI) TABS Take 1 tablet by mouth daily.   pantoprazole (PROTONIX) 40 MG tablet Take 40 mg by mouth daily.   potassium chloride (KLOR-CON M) 10 MEQ tablet Take 1 tablet  (10 mEq total) by mouth daily.   pravastatin (PRAVACHOL) 40 MG tablet TAKE 1 TABLET BY MOUTH AT  BEDTIME   Probiotic Product ([employer] PROBIOTIC) CAPS    Semaglutide,0.25 or 0.5MG /DOS, (OZEMPIC, 0.25 OR 0.5 MG/DOSE,) 2 MG/1.5ML SOPN Inject 0.25 mg into the skin.   valACYclovir (VALTREX) 1000 MG tablet Take 1 tablet (1,000 mg total) by mouth 2 (two) times daily.     Review of Systems      All other systems reviewed and are otherwise negative except as noted above.  Physical Exam    There were no vitals filed for this visit.      Wt Readings from Last 3 Encounters:  09/29/21 199 lb 14.4 oz (90.7 kg)  09/26/21 201 lb (91.2 kg)  09/23/21 202 lb (91.6 kg)     GEN: Well nourished, well developed, in no acute distress. HEENT: normal. Neck: Supple, no JVD, carotid bruits, or masses. Cardiac: RRR, no murmurs, rubs, or gallops. No clubbing, cyanosis, edema.  Radials/PT 2+ and equal bilaterally.  Respiratory:  Respirations regular and unlabored, clear to auscultation bilaterally. GI: Soft, nontender, nondistended. MS: No deformity or atrophy. Skin: Warm and dry, no rash. Neuro:  Strength and sensation are intact. Psych: Slightly depressed today  Assessment & Plan    Acute on chronic HFrEF -She appears euvolemic on exam today -We will provide as needed Bumex 1 mg to take as needed for lower extremity edema -She is to take her weight daily and record.  If she gains more than 2 pounds overnight or 5 pounds in a week she can take an extra dose of Bumex -Echocardiogram 5/23 showed EF 20 to 25% with no significant change from her prior echo report -Continue GDMT: Bumex 1 mg twice daily, Ozempic 0.25 mg injection, Coreg 3.125mg  -She did not tolerate ARB or spironolactone -Heart failure medications have been difficult to add due to a lot of medication intolerances and allergies. -She will need a BMET to evaluate renal function and potassium  COPD -Continue follow-up with your  pulmonologist  Hypertension -Today, 134/80 -Continue current medication regimen -Continue to monitor your blood pressure at home   Diabetes mellitus type 2 -Recently started on Ozempic, Jardiance also prescribed -Blood glucose was well controlled on current regimen   VT with recent hospitalization  -Converted with IV amiodarone -Patient was wondering about ICD placement however the risk of the device extraction with ICD placement is very high given her overall tenuous status. -Plan for EP follow-up  Hypothyroidism -Continue to f/u with your PCP  7. OSA -BiPAP at night  Cardiac Rehabilitation Eligibility Assessment   Cleared for cardiac rehab  Disposition: Follow up 3 months with Dr. Johney Frame or APP.   Signed,  Elgie Collard, PA-C 10/13/2021 [facility]

## 2022-11-24 ENCOUNTER — Ambulatory Visit: Payer: 59 | Attending: Physician Assistant | Admitting: Physician Assistant

## 2022-11-24 ENCOUNTER — Encounter: Payer: Self-pay | Admitting: Physician Assistant

## 2022-11-24 VITALS — BP 134/68 | HR 64 | Ht 61.0 in | Wt 214.6 lb

## 2022-11-24 DIAGNOSIS — Z79899 Other long term (current) drug therapy: Secondary | ICD-10-CM | POA: Diagnosis not present

## 2022-11-24 DIAGNOSIS — I428 Other cardiomyopathies: Secondary | ICD-10-CM | POA: Diagnosis not present

## 2022-11-24 DIAGNOSIS — I5020 Unspecified systolic (congestive) heart failure: Secondary | ICD-10-CM

## 2022-11-24 DIAGNOSIS — I5022 Chronic systolic (congestive) heart failure: Secondary | ICD-10-CM | POA: Diagnosis not present

## 2022-11-24 DIAGNOSIS — K589 Irritable bowel syndrome without diarrhea: Secondary | ICD-10-CM | POA: Diagnosis not present

## 2022-11-24 DIAGNOSIS — I472 Ventricular tachycardia, unspecified: Secondary | ICD-10-CM

## 2022-11-24 DIAGNOSIS — E875 Hyperkalemia: Secondary | ICD-10-CM

## 2022-11-24 DIAGNOSIS — E785 Hyperlipidemia, unspecified: Secondary | ICD-10-CM | POA: Diagnosis not present

## 2022-11-24 DIAGNOSIS — I10 Essential (primary) hypertension: Secondary | ICD-10-CM

## 2022-11-24 DIAGNOSIS — E1169 Type 2 diabetes mellitus with other specified complication: Secondary | ICD-10-CM | POA: Diagnosis not present

## 2022-11-24 NOTE — Patient Instructions (Addendum)
Medication Instructions:   Your physician recommends that you continue on your current medications as directed. Please refer to the Current Medication list given to you today.   *If you need a refill on your cardiac medications before your next appointment, please call your pharmacy*   Lab Work: BNP TODAY     If you have labs (blood work) drawn today and your tests are completely normal, you will receive your results only by: [place] (if you have MyChart) OR A paper copy in the mail If you have any lab test that is abnormal or we need to change your treatment, we will call you to review the results.   Testing/Procedures: NONE ORDERED  TODAY    Follow-Up: At [facility], you and your health needs are our priority.  As part of our continuing mission to provide you with exceptional heart care, we have created designated Provider Care Teams.  These Care Teams include your primary Cardiologist (physician) and Advanced Practice Providers (APPs -  Physician Assistants and Nurse Practitioners) who all work together to provide you with the care you need, when you need it.  We recommend signing up for the patient portal called "MyChart".  Sign up information is provided on this After Visit Summary.  MyChart is used to connect with patients for Virtual Visits (Telemedicine).  Patients are able to view lab/test results, encounter notes, upcoming appointments, etc.  Non-urgent messages can be sent to your provider as well.   To learn more about what you can do with MyChart, go to [email or web address].    Your next appointment:  You have been referred to GASTROENTEROLOGIST  DR HUNG  FOR IBS   4 6 week(s)  Provider:   Freada Bergeron, MD  / [facility]  APP    Other Instructions

## 2022-11-25 ENCOUNTER — Ambulatory Visit (INDEPENDENT_AMBULATORY_CARE_PROVIDER_SITE_OTHER): Payer: 59 | Admitting: Family Medicine

## 2022-11-25 ENCOUNTER — Other Ambulatory Visit: Payer: Self-pay | Admitting: *Deleted

## 2022-11-25 ENCOUNTER — Encounter: Payer: Self-pay | Admitting: Family Medicine

## 2022-11-25 VITALS — BP 133/59 | HR 63 | Ht 61.0 in | Wt 216.0 lb

## 2022-11-25 DIAGNOSIS — E785 Hyperlipidemia, unspecified: Secondary | ICD-10-CM | POA: Diagnosis not present

## 2022-11-25 DIAGNOSIS — E1169 Type 2 diabetes mellitus with other specified complication: Secondary | ICD-10-CM

## 2022-11-25 DIAGNOSIS — I10 Essential (primary) hypertension: Secondary | ICD-10-CM

## 2022-11-25 DIAGNOSIS — G4733 Obstructive sleep apnea (adult) (pediatric): Secondary | ICD-10-CM | POA: Diagnosis not present

## 2022-11-25 DIAGNOSIS — I5022 Chronic systolic (congestive) heart failure: Secondary | ICD-10-CM

## 2022-11-25 LAB — PRO B NATRIURETIC PEPTIDE: NT-Pro BNP: 1021 pg/mL — ABNORMAL HIGH (ref 0–301)

## 2022-11-25 MED ORDER — METFORMIN HCL ER 500 MG PO TB24: 500.0000 mg | ORAL_TABLET | Freq: Two times a day (BID) | ORAL | 0 refills | Status: DC

## 2022-11-25 MED ORDER — EMPAGLIFLOZIN 10 MG PO TABS: 10.0000 mg | ORAL_TABLET | Freq: Every day | ORAL | 3 refills | Status: DC

## 2022-11-25 NOTE — Assessment & Plan Note (Signed)
Will recheck before leaves today.

## 2022-11-25 NOTE — Assessment & Plan Note (Addendum)
  Now on Jardiance.   Home dry weight  214, . Patient did not tolerate MRA, ARB or ACE inh due to hyperkalemia.   Continue carvedilol  Continue to weigh daily Okay to increase to 1-1/2 tabs of Bumex in the morning and 1 tab in the afternoon since she has been taking an extra tab pretty much every other day.  Will try this for 2 weeks and see if it helps keep the volume status more stable.

## 2022-11-25 NOTE — Assessment & Plan Note (Addendum)
She still on Ozempic 0.25 mg and because of the slow gut and gastroparesis issues she really does not want to go up.  She is actually interested in maybe going back on metformin instead she would really rather come off of a GLP-1.  So we will discontinue the Ozempic and switch back to metformin she still has an entire 90-day bottle at home restart with twice a day dosing and then we can adjust from there.  Lab Results  Component Value Date   HGBA1C 6.2 (A) 10/27/2022

## 2022-11-25 NOTE — Progress Notes (Signed)
Established Patient Office Visit  Subjective   Patient ID: Tanya Hogan, female    DOB: Sep 24, 1950  Age: 72 y.o. MRN: [id number]  Chief Complaint  Patient presents with   Congestive Heart Failure    HPI  Hypertension- Pt denies chest pain, SOB, dizziness, or heart palpitations.  Taking meds as directed w/o problems.  Denies medication side effects.    CHF- taking every other day an extra tab of bumex. Wonders if could take 1.5 tabs in AM ad 1 in afternoon.     ROS    Objective:     BP (!) 133/59   Pulse 63   Ht 5\' 1"  (1.549 m)   Wt 216 lb (98 kg)   SpO2 94%   BMI 40.81 kg/m    Physical Exam Vitals and nursing note reviewed.  Constitutional:      Appearance: She is well-developed.  HENT:     Head: Normocephalic and atraumatic.  Cardiovascular:     Rate and Rhythm: Normal rate and regular rhythm.     Heart sounds: Normal heart sounds.  Pulmonary:     Effort: Pulmonary effort is normal.     Breath sounds: Normal breath sounds.  Skin:    General: Skin is warm and dry.  Neurological:     Mental Status: She is alert and oriented to person, place, and time.  Psychiatric:        Behavior: Behavior normal.      No results found for any visits on 11/25/22.    The ASCVD Risk score (Arnett DK, et al., 2019) failed to calculate for the following reasons:   The patient has a prior MI or stroke diagnosis    Assessment & Plan:   Problem List Items Addressed This Visit       Cardiovascular and Mediastinum   Essential hypertension    Will recheck before leaves today.        Chronic systolic heart failure - Primary     Now on Jardiance.   Home dry weight  214, . Patient did not tolerate MRA, ARB or ACE inh due to hyperkalemia.   Continue carvedilol  Continue to weigh daily Okay to increase to 1-1/2 tabs of Bumex in the morning and 1 tab in the afternoon since she has been taking an extra tab pretty much every other day.  Will try this for 2 weeks and  see if it helps keep the volume status more stable.         Respiratory   OSA (obstructive sleep apnea)    Wearing her BiPAP consistently.  Wakes up with her oxygen level around 90 5 in the morning she says when she gets up and gets moving and clears out some mucus she feels better.        Endocrine   Type 2 diabetes mellitus with hyperlipidemia    She still on Ozempic 0.25 mg and because of the slow gut and gastroparesis issues she really does not want to go up.  She is actually interested in maybe going back on metformin instead she would really rather come off of a GLP-1.  So we will discontinue the Ozempic and switch back to metformin she still has an entire 90-day bottle at home restart with twice a day dosing and then we can adjust from there.  Lab Results  Component Value Date   HGBA1C 6.2 (A) 10/27/2022        Relevant Medications   metFORMIN (GLUCOPHAGE-XR) 500  MG 24 hr tablet    Return in about 2 months (around 01/25/2023) for Diabetes follow-up.    I spent 45 minutes on the day of the encounter to include pre-visit record review, face-to-face time with the patient and post visit ordering of test.  Beatrice Lecher, MD

## 2022-11-25 NOTE — Assessment & Plan Note (Signed)
Wearing her BiPAP consistently.  Wakes up with her oxygen level around 90 5 in the morning she says when she gets up and gets moving and clears out some mucus she feels better.

## 2022-11-27 ENCOUNTER — Other Ambulatory Visit: Payer: Self-pay | Admitting: Pharmacist

## 2022-11-27 NOTE — Patient Instructions (Signed)
Darel Hong,  Thank you for speaking with me today. As discussed, continue medications as prescribed by your providers. Let me know if you desire pharmacist support in the future.  Take care, Elmarie Shiley, PharmD, BCPS Clinical Pharmacist [facility]

## 2022-11-27 NOTE — Progress Notes (Signed)
Care Coordination Call  Outreached patient to discuss medication and offer support through recent medication changes. Patient briefly reviewed medications, states today is not a good time for her. Offered that we could get a day/time scheduled for [place] to speak on the phone in the future, she declines at this time.  Will notify provider to re-engage our relationship if patient desires pharmacist support in the future.  Lynnda Shields, PharmD, BCPS Clinical Pharmacist [facility]

## 2022-12-03 ENCOUNTER — Telehealth: Payer: 59

## 2022-12-03 ENCOUNTER — Ambulatory Visit (INDEPENDENT_AMBULATORY_CARE_PROVIDER_SITE_OTHER): Payer: 59

## 2022-12-03 DIAGNOSIS — E1169 Type 2 diabetes mellitus with other specified complication: Secondary | ICD-10-CM

## 2022-12-03 DIAGNOSIS — I5022 Chronic systolic (congestive) heart failure: Secondary | ICD-10-CM

## 2022-12-03 NOTE — Chronic Care Management (AMB) (Signed)
Chronic Care Management   CCM RN Visit Note  12/03/2022 Name: Tanya Hogan MRN: [id number] DOB: 08/29/1950  Subjective: Tanya Hogan is a 72 y.o. year old female who is a primary care patient of [name], Barbarann Ehlersatherine D, MD. The patient was referred to the Chronic Care Management team for assistance with care management needs subsequent to provider initiation of CCM services and plan of care.    Today's Visit:  Engaged with patient by telephone for follow up visit.        Goals Addressed             This Visit's Progress    CCM Expected Outcome:  Monitor, Self-Manage and Reduce Symptoms of Diabetes       Current Barriers:  Knowledge Deficits related to constipation and having to change medications to see if constipation is resolved and blood sugar elevations after medication changes Chronic Disease Management support and education needs related to effective management of DM Lab Results  Component Value Date   HGBA1C 6.2 (A) 10/27/2022     Planned Interventions: Provided education to patient about basic DM disease process. The patient states her blood sugars have been bouncing around since she has not been taking her Ozempic. She had started taking the Ozempic again but has stopped. She is back taking Metformin 500 mg BID. She states that she is monitoring and her blood sugars are going up more after eating but then coming back down. She is more stable with the Ozempic but it causes her to have more gut issues. Reflective listening and support given.  Reviewed medications with patient and discussed importance of medication adherence. The patient states she has stopped taking Ozempic and her blood sugars are staying stable. Is taking Metformin 500 mg BID;        Reviewed prescribed diet with patient heart healthy/ADA. Review and education provided. Education and support given ; Counseled on importance of regular laboratory monitoring as prescribed. Has regular labwork. States that  she hopes her next A1C will not be too elevated. Feels it will go up some because of not taking the Ozempic.         Discussed plans with patient for ongoing care management follow up and provided patient with direct contact information for care management team;      Provided patient with written educational materials related to hypo and hyperglycemia and importance of correct treatment. Denies any issues with hypo or hyperglycemia. States the highest she has seen is around 243 and the lowest around 100.;       Reviewed scheduled/upcoming provider appointments including: 02-16-2023;         Advised patient, providing education and rationale, to check cbg twice daily and when you have symptoms of low or high blood sugar and record. Currently the patient has a continuous reader. She states that it has been 100 to 243. She states fasting it is 120 to 130. Usually after eating it goes up to 200's. Review of goal of fasting is <130 and post prandial of <180.      Referral made to pharmacy team for assistance with PAP for DM medications. The patient is working with the pharm D for medication management and support Review of patient status, including review of consultants reports, relevant laboratory and other test results, and medications completed;       Advised patient to discuss changes in DM, questions, or concerns with provider;      Screening for signs and symptoms of  depression related to chronic disease state;        Assessed social determinant of health barriers;  The patient has made positive changes in her habits and lifestyle. She feels like she is not as stressed. She says going to the senior center is helping her with her socialization and having a better relationship with her family as well. Encouraged the patient to continue doing things she enjoys.    Symptom Management: Take medications as prescribed   Attend all scheduled provider appointments Call provider office for new concerns or  questions  call the Suicide and Crisis Lifeline: 988 call the [place] National Suicide Prevention Lifeline: 657-745-3774 or TTY: 905 794 9887 TTY (918)677-2598) to talk to a trained counselor call 1-800-273-TALK (toll free, 24 hour hotline) if experiencing a Mental Health or Behavioral Health Crisis  check feet daily for cuts, sores or redness enter blood sugar readings and medication or insulin into daily log take the blood sugar log to all doctor visits trim toenails straight across manage portion size keep feet up while sitting wash and dry feet carefully every day wear comfortable, cotton socks wear comfortable, well-fitting shoes  Follow Up Plan: Telephone follow up appointment with care management team member scheduled for: 01-14-2023 at 345 pm       CCM Expected Outcome:  Monitor, Self-Manage and Reduce Symptoms of Heart Failure       Current Barriers:  Knowledge Deficits related to the biventricular pacing system and the generator changes and fully understanding the pacing system and how it works Chronic Disease Management support and education needs related to effective management of HF EF% 20-25 % in May of 2023, 08-29-2022 EF 25 to 30%  Pacemaker generator setting changed in May of 2023. During hospitalization they discussed her upgrading her pacemaker to an ICD- the patient is unsure if she wants to do this Post discharge from the hospital for SVT 08-28-2022 to 09-03-2022, post discharge from the hospital 10-29-2022 to 11-01-2022  Wt Readings from Last 3 Encounters:  11/25/22 216 lb (98 kg)  11/24/22 214 lb 9.6 oz (97.3 kg)  11/04/22 216 lb (98 kg)     Planned Interventions: Basic overview and discussion of pathophysiology of Heart Failure reviewed. Patient states her dry weight is 214 and this is good for her. She is weighing daily and her weight today was 212. The patient is taking Bumex 1.5 tablets in the am, 1 tablet in pm. This seems to be working well for her. She says things  are balanced currently. She is going to start going to the heart failure clinic on 12-14-2022 and she is actually looking forward to this. The patient denies any acute changes in her heart failure today.  Provided education on low sodium diet/ADA. Education and review. The patient is compliant with dietary restrictions. She has had issues in the past with hyperkalemia and she is mindful of this. Education on monitoring sodium content of foods closely. She states sometimes she does backslide and sometimes she does not know the content of sodium in foods, but she is monitoring closer.  Reviewed Heart Failure Action Plan in depth and provided written copy.Review with the patient the plan of care for changes in her weight. She has got her fluid pretty much balanced and also her bowels are moving more effectively now. Assessed need for readable accurate scales in home. She is weighing daily and she is monitoring closely for changes. She will be going to the heart failure clinic on 12-14-2022 and is looking  forward to this.  Provided education about placing scale on hard, flat surface Advised patient to weigh each morning after emptying bladder Discussed importance of daily weight and advised patient to weigh and record daily. The patient is compliant with weighing at this time.  She has to sleep with 3 pillows and she easily gets short of breath but states her oxygen stays up. She has a new sims card for her bipap machine and is using it now at night.  Reviewed role of diuretics in prevention of fluid overload and management of heart failure. The patient has parameters and is taking her Bumex as instructed.The patient is in much better spirits today and feels a lot better.  Discussed the importance of keeping all appointments with provider. 02-16-2023 with the pcp, reminder given today. Sees cardiologist regularly. Starts the heart failure clinic on 12-14-2022 Provided patient with education about the role of  exercise in the management of heart failure. The patient is going to the senior center 3 days a week and doing exercises there. She likes this better than what she was doing at the [facility]. She works out about 45 minutes each time she goes. She is also doing mahjong, a game she had been wanting to learn to play. She says the socialization and activity have been a positive thing for her. She is optimistic and taking it "one day at a time". The patient wants to do what she can to take care of herself and her chronic conditions. She is thankful for new things she is doing and the support of her family as well. Advised patient to discuss changes in HF or other  with provider Screening for signs and symptoms of depression related to chronic disease state  Assessed social determinant of health barriers  Symptom Management: Take medications as prescribed   Attend all scheduled provider appointments Call provider office for new concerns or questions  call the Suicide and Crisis Lifeline: 988 call the [place] National Suicide Prevention Lifeline: 256-543-4746 or TTY: 3616390661 TTY 318-650-0095) to talk to a trained counselor call 1-800-273-TALK (toll free, 24 hour hotline) if experiencing a Mental Health or Behavioral Health Crisis  call office if I gain more than 2 pounds in one day or 5 pounds in one week keep legs up while sitting track weight in diary use salt in moderation watch for swelling in feet, ankles and legs every day weigh myself daily begin a heart failure diary bring diary to all appointments develop a rescue plan follow rescue plan if symptoms flare-up track symptoms and what helps feel better or worse dress right for the weather, hot or cold  Follow Up Plan: Telephone follow up appointment with care management team member scheduled for: 01-14-2023 at 345 pm          Plan:Telephone follow up appointment with care management team member scheduled for:  01-14-2023 at 345 pm  Alto Denver RN, MSN, CCM RN Care Manager  Chronic Care Management Direct Number: [phone]

## 2022-12-03 NOTE — Patient Instructions (Signed)
Please call the care guide team at [phone] if you need to cancel or reschedule your appointment.   If you are experiencing a Mental Health or Behavioral Health Crisis or need someone to talk to, please call the Suicide and Crisis Lifeline: 988 call the [place] National Suicide Prevention Lifeline: 4306259425 or TTY: 276-214-3899 TTY [phone]) to talk to a trained counselor call 1-800-273-TALK (toll free, 24 hour hotline) go to [facility] [address], [place] [phone])   Following is a copy of the CCM Program Consent:  CCM service includes personalized support from designated clinical staff supervised by the physician, including individualized plan of care and coordination with other care providers 24/7 contact phone numbers for assistance for urgent and routine care needs. Service will only be billed when office clinical staff spend 20 minutes or more in a month to coordinate care. Only one practitioner may furnish and bill the service in a calendar month. The patient may stop CCM services at amy time (effective at the end of the month) by phone call to the office staff. The patient will be responsible for cost sharing (co-pay) or up to 20% of the service fee (after annual deductible is met)  Following is a copy of your full provider care plan:   Goals Addressed             This Visit's Progress    CCM Expected Outcome:  Monitor, Self-Manage and Reduce Symptoms of Diabetes       Current Barriers:  Knowledge Deficits related to constipation and having to change medications to see if constipation is resolved and blood sugar elevations after medication changes Chronic Disease Management support and education needs related to effective management of DM Lab Results  Component Value Date   HGBA1C 6.2 (A) 10/27/2022     Planned Interventions: Provided education to patient about basic DM disease process. The patient states her blood  sugars have been bouncing around since she has not been taking her Ozempic. She had started taking the Ozempic again but has stopped. She is back taking Metformin 500 mg BID. She states that she is monitoring and her blood sugars are going up more after eating but then coming back down. She is more stable with the Ozempic but it causes her to have more gut issues. Reflective listening and support given.  Reviewed medications with patient and discussed importance of medication adherence. The patient states she has stopped taking Ozempic and her blood sugars are staying stable. Is taking Metformin 500 mg BID;        Reviewed prescribed diet with patient heart healthy/ADA. Review and education provided. Education and support given ; Counseled on importance of regular laboratory monitoring as prescribed. Has regular labwork. States that she hopes her next A1C will not be too elevated. Feels it will go up some because of not taking the Ozempic.         Discussed plans with patient for ongoing care management follow up and provided patient with direct contact information for care management team;      Provided patient with written educational materials related to hypo and hyperglycemia and importance of correct treatment. Denies any issues with hypo or hyperglycemia. States the highest she has seen is around 243 and the lowest around 100.;       Reviewed scheduled/upcoming provider appointments including: 02-16-2023;         Advised patient, providing education and rationale, to check cbg twice daily and when you  have symptoms of low or high blood sugar and record. Currently the patient has a continuous reader. She states that it has been 100 to 243. She states fasting it is 120 to 130. Usually after eating it goes up to 200's. Review of goal of fasting is <130 and post prandial of <180.      Referral made to pharmacy team for assistance with PAP for DM medications. The patient is working with the pharm D for  medication management and support Review of patient status, including review of consultants reports, relevant laboratory and other test results, and medications completed;       Advised patient to discuss changes in DM, questions, or concerns with provider;      Screening for signs and symptoms of depression related to chronic disease state;        Assessed social determinant of health barriers;  The patient has made positive changes in her habits and lifestyle. She feels like she is not as stressed. She says going to the senior center is helping her with her socialization and having a better relationship with her family as well. Encouraged the patient to continue doing things she enjoys.    Symptom Management: Take medications as prescribed   Attend all scheduled provider appointments Call provider office for new concerns or questions  call the Suicide and Crisis Lifeline: 988 call the [place] National Suicide Prevention Lifeline: (346)486-39191-806-517-7128 or TTY: 587-716-22251-800-799-4 TTY 825-519-8677([phone]) to talk to a trained counselor call 1-800-273-TALK (toll free, 24 hour hotline) if experiencing a Mental Health or Behavioral Health Crisis  check feet daily for cuts, sores or redness enter blood sugar readings and medication or insulin into daily log take the blood sugar log to all doctor visits trim toenails straight across manage portion size keep feet up while sitting wash and dry feet carefully every day wear comfortable, cotton socks wear comfortable, well-fitting shoes  Follow Up Plan: Telephone follow up appointment with care management team member scheduled for: 01-14-2023 at 345 pm       CCM Expected Outcome:  Monitor, Self-Manage and Reduce Symptoms of Heart Failure       Current Barriers:  Knowledge Deficits related to the biventricular pacing system and the generator changes and fully understanding the pacing system and how it works Chronic Disease Management support and education needs related  to effective management of HF EF% 20-25 % in May of 2023, 08-29-2022 EF 25 to 30%  Pacemaker generator setting changed in May of 2023. During hospitalization they discussed her upgrading her pacemaker to an ICD- the patient is unsure if she wants to do this Post discharge from the hospital for SVT 08-28-2022 to 09-03-2022, post discharge from the hospital 10-29-2022 to 11-01-2022  Wt Readings from Last 3 Encounters:  11/25/22 216 lb (98 kg)  11/24/22 214 lb 9.6 oz (97.3 kg)  11/04/22 216 lb (98 kg)     Planned Interventions: Basic overview and discussion of pathophysiology of Heart Failure reviewed. Patient states her dry weight is 214 and this is good for her. She is weighing daily and her weight today was 212. The patient is taking Bumex 1.5 tablets in the am, 1 tablet in pm. This seems to be working well for her. She says things are balanced currently. She is going to start going to the heart failure clinic on 12-14-2022 and she is actually looking forward to this. The patient denies any acute changes in her heart failure today.  Provided education on low sodium  diet/ADA. Education and review. The patient is compliant with dietary restrictions. She has had issues in the past with hyperkalemia and she is mindful of this. Education on monitoring sodium content of foods closely. She states sometimes she does backslide and sometimes she does not know the content of sodium in foods, but she is monitoring closer.  Reviewed Heart Failure Action Plan in depth and provided written copy.Review with the patient the plan of care for changes in her weight. She has got her fluid pretty much balanced and also her bowels are moving more effectively now. Assessed need for readable accurate scales in home. She is weighing daily and she is monitoring closely for changes. She will be going to the heart failure clinic on 12-14-2022 and is looking forward to this.  Provided education about placing scale on hard, flat  surface Advised patient to weigh each morning after emptying bladder Discussed importance of daily weight and advised patient to weigh and record daily. The patient is compliant with weighing at this time.  She has to sleep with 3 pillows and she easily gets short of breath but states her oxygen stays up. She has a new sims card for her bipap machine and is using it now at night.  Reviewed role of diuretics in prevention of fluid overload and management of heart failure. The patient has parameters and is taking her Bumex as instructed.The patient is in much better spirits today and feels a lot better.  Discussed the importance of keeping all appointments with provider. 02-16-2023 with the pcp, reminder given today. Sees cardiologist regularly. Starts the heart failure clinic on 12-14-2022 Provided patient with education about the role of exercise in the management of heart failure. The patient is going to the senior center 3 days a week and doing exercises there. She likes this better than what she was doing at the [facility]. She works out about 45 minutes each time she goes. She is also doing mahjong, a game she had been wanting to learn to play. She says the socialization and activity have been a positive thing for her. She is optimistic and taking it "one day at a time". The patient wants to do what she can to take care of herself and her chronic conditions. She is thankful for new things she is doing and the support of her family as well. Advised patient to discuss changes in HF or other  with provider Screening for signs and symptoms of depression related to chronic disease state  Assessed social determinant of health barriers  Symptom Management: Take medications as prescribed   Attend all scheduled provider appointments Call provider office for new concerns or questions  call the Suicide and Crisis Lifeline: 988 call the [place] National Suicide Prevention Lifeline: (223)052-2660 or TTY: 731 502 4434 TTY  912-827-1390) to talk to a trained counselor call 1-800-273-TALK (toll free, 24 hour hotline) if experiencing a Mental Health or Behavioral Health Crisis  call office if I gain more than 2 pounds in one day or 5 pounds in one week keep legs up while sitting track weight in diary use salt in moderation watch for swelling in feet, ankles and legs every day weigh myself daily begin a heart failure diary bring diary to all appointments develop a rescue plan follow rescue plan if symptoms flare-up track symptoms and what helps feel better or worse dress right for the weather, hot or cold  Follow Up Plan: Telephone follow up appointment with care management team member scheduled for: 01-14-2023  at 345 pm          Patient verbalizes understanding of instructions and care plan provided today and agrees to view in MyChart. Active MyChart status and patient understanding of how to access instructions and care plan via MyChart confirmed with patient.  Telephone follow up appointment with care management team member scheduled for: 01-14-2023 at 345 pm

## 2022-12-03 NOTE — Addendum Note (Signed)
Addended by: Oleta Mouse on: 12/03/2022 01:15 PM   Modules accepted: Orders

## 2022-12-08 ENCOUNTER — Telehealth: Payer: Self-pay | Admitting: Cardiology

## 2022-12-08 NOTE — Telephone Encounter (Signed)
Pt c/o swelling: STAT is pt has developed SOB within 24 hours  If swelling, where is the swelling located?   Not in arms and legs  How much weight have you gained and in what time span? 3 lbs yesterday and 2 lbs overnight  Have you gained 3 pounds in a day or 5 pounds in a week?   Yes  Do you have a log of your daily weights (if so, list)?   Yes  Are you currently taking a fluid pill?   Yes  Are you currently SOB?   No  Have you traveled recently?  No  Patient stated she has not had much swelling but she has gained 3 lbs yesterday and 2 additional pounds overnight.  Patient wants to know next steps.

## 2022-12-08 NOTE — Telephone Encounter (Signed)
Spoke with patient who reports increase weight gain and not bowel movement for 2 days. She states she has gained 5 lbs in the last 3 days. She denies SOB and edema. Reviewed her current medications. She has been taking Senokot without any relief. Encouraged her to eat foods with more water content and fiber, try eating some prunes and encouraged walking or getting more movement in.   Advised her if she develops abdominal pain and/or starts having N/V to go to the ED for evaluation. Also advised I would forward her concerns to Jari Favre, [place] and Dr Shari Prows for review.

## 2022-12-09 NOTE — Telephone Encounter (Signed)
Follow up call to patient to review Tanya Favre, PA comments and recommendations. Patient states she has already followed up with her GI doctor. Patient is appreciative of follow up call.

## 2022-12-10 ENCOUNTER — Telehealth: Payer: Self-pay | Admitting: Cardiology

## 2022-12-10 NOTE — Telephone Encounter (Signed)
These symptoms are not typically seen with Jardiance but sounds like she's already had f/u with PCP and GI without answers. Agree ok to hold Jardiance for a week or so to see if her symptoms improve. If not, she should resume and she should f/u with PCP again - ideally prefer her to continue SGLT2i in the long run if possible given her HFrEF.

## 2022-12-10 NOTE — Telephone Encounter (Signed)
Called pt advised of pharmacy response:  These symptoms are not typically seen with Jardiance but sounds like she's already had f/u with PCP and GI without answers. Agree ok to hold Jardiance for a week or so to see if her symptoms improve. If not, she should resume and she should f/u with PCP again - ideally prefer her to continue SGLT2i in the long run if possible given her HFrEF.   Pt became upset reports [facility]iance website list her symptoms as common side effects.  Pt reports she has experienced dehydration.  Pt will stop medication for now.  When asked if she will call in to let us know how she is feeling after a week.  Pt replys " if I remember" and then attempted to end call.  Advised pt I will send message to Providers nurse to follow up in a week.  No further questions or concerns at this time.

## 2022-12-10 NOTE — Telephone Encounter (Signed)
Pt c/o medication issue:  1. Name of Medication:   empagliflozin (JARDIANCE) 10 MG TABS tablet    2. How are you currently taking this medication (dosage and times per day)? Take 1 tablet (10 mg total) by mouth daily.   3. Are you having a reaction (difficulty breathing--STAT)? No  4. What is your medication issue? Patient called stating that she is having extreme dryness in her mouth, shaking in her back, unable to pass stool, and unable to urinate. Patient stated that she had spoken with her PCP and GI, both of them have tried everything they can think of to help her. Patient stated that she was on this medication before, but she had stop taking this medication before it had gotten this severe. Patient would like to stop taking this medication for a couple of weeks to see if this is the cause of these issues. Please advise.

## 2022-12-10 NOTE — Telephone Encounter (Signed)
Called pt in regards to [place].  Reports symptoms have gradually increased since starting medication.  Has worked with GI doctor for over 4 weeks with no relief.  Has also worked with PCP.  Pt can't pee only dribbles.  Reports wt 112 lbs 2 weeks ago 117 lbs 2 days ago and 116 lbs today.  Was finally able to move bowels yesterday.  Has no swelling in hands and feet but belly is nice and round. Pt last took Jardiance this A.M.  Advised pt to stop medication for now.  Will send a message to pharmacist and provider to review.

## 2022-12-14 ENCOUNTER — Ambulatory Visit (HOSPITAL_COMMUNITY)
Admission: RE | Admit: 2022-12-14 | Discharge: 2022-12-14 | Disposition: A | Discharge status: Home / Self Care | Payer: 59 | Source: Ambulatory Visit | Attending: Cardiology | Admitting: Cardiology

## 2022-12-14 ENCOUNTER — Encounter (HOSPITAL_COMMUNITY): Payer: Self-pay | Admitting: Cardiology

## 2022-12-14 VITALS — BP 102/60 | HR 65 | Wt 221.6 lb

## 2022-12-14 DIAGNOSIS — I428 Other cardiomyopathies: Secondary | ICD-10-CM | POA: Insufficient documentation

## 2022-12-14 DIAGNOSIS — I110 Hypertensive heart disease with heart failure: Secondary | ICD-10-CM | POA: Insufficient documentation

## 2022-12-14 DIAGNOSIS — Z950 Presence of cardiac pacemaker: Secondary | ICD-10-CM

## 2022-12-14 DIAGNOSIS — I10 Essential (primary) hypertension: Secondary | ICD-10-CM

## 2022-12-14 DIAGNOSIS — C50919 Malignant neoplasm of unspecified site of unspecified female breast: Secondary | ICD-10-CM | POA: Diagnosis not present

## 2022-12-14 DIAGNOSIS — Z79899 Other long term (current) drug therapy: Secondary | ICD-10-CM | POA: Insufficient documentation

## 2022-12-14 DIAGNOSIS — M1990 Unspecified osteoarthritis, unspecified site: Secondary | ICD-10-CM | POA: Insufficient documentation

## 2022-12-14 DIAGNOSIS — E119 Type 2 diabetes mellitus without complications: Secondary | ICD-10-CM

## 2022-12-14 DIAGNOSIS — I442 Atrioventricular block, complete: Secondary | ICD-10-CM | POA: Diagnosis not present

## 2022-12-14 DIAGNOSIS — Z853 Personal history of malignant neoplasm of breast: Secondary | ICD-10-CM | POA: Diagnosis not present

## 2022-12-14 DIAGNOSIS — E785 Hyperlipidemia, unspecified: Secondary | ICD-10-CM | POA: Diagnosis not present

## 2022-12-14 DIAGNOSIS — J4489 Other specified chronic obstructive pulmonary disease: Secondary | ICD-10-CM | POA: Diagnosis not present

## 2022-12-14 DIAGNOSIS — G4733 Obstructive sleep apnea (adult) (pediatric): Secondary | ICD-10-CM | POA: Diagnosis not present

## 2022-12-14 DIAGNOSIS — E1151 Type 2 diabetes mellitus with diabetic peripheral angiopathy without gangrene: Secondary | ICD-10-CM | POA: Insufficient documentation

## 2022-12-14 DIAGNOSIS — I5022 Chronic systolic (congestive) heart failure: Secondary | ICD-10-CM | POA: Insufficient documentation

## 2022-12-14 MED ORDER — DIGOXIN 125 MCG PO TABS: 0.1250 mg | ORAL_TABLET | Freq: Every day | ORAL | 3 refills | Status: DC

## 2022-12-14 NOTE — Patient Instructions (Signed)
STOP Jardiance  STOP Carvedilol  START Digoxin . daily.  Blood work in a week   Your physician recommends that you schedule a follow-up appointment in: 6 months ( October) ** please call the office in August to arrange your follow up appointment.**  If you have any questions or concerns before your next appointment please send [place] a message through [employer] or call our office at [phone].    TO LEAVE A MESSAGE FOR THE NURSE SELECT OPTION 2, PLEASE LEAVE A MESSAGE INCLUDING: YOUR NAME DATE OF BIRTH CALL BACK NUMBER REASON FOR CALL**this is important as we prioritize the call backs  YOU WILL RECEIVE A CALL BACK THE SAME DAY AS LONG AS YOU CALL BEFORE 4:00 PM  At the [facility], you and your health needs are our priority. As part of our continuing mission to provide you with exceptional heart care, we have created designated Provider Care Teams. These Care Teams include your primary Cardiologist (physician) and Advanced Practice Providers (APPs- Physician Assistants and Nurse Practitioners) who all work together to provide you with the care you need, when you need it.   You may see any of the following providers on your designated Care Team at your next follow up: Dr Arvilla Meres Dr Marca Ancona Dr. Marcos Eke, NP Robbie Lis, [place] [facility] [place], [place] Brynda Peon, NP Karle Plumber, PharmD   Please be sure to bring in all your medications bottles to every appointment.    Thank you for choosing [facility]

## 2022-12-14 NOTE — Progress Notes (Addendum)
ADVANCED HEART FAILURE CLINIC NOTE  Referring Physician: [name], *  Primary Care: [name], MD Primary Cardiologist:  HPI: Tanya Hogan is a 72 y.o. female with hypertension, hyperlipidemia, type 2 diabetes, nonischemic cardiomyopathy, obstructive sleep apnea on CPAP, complete heart block status post CRT-P and history of breast cancer status postmastectomy presenting today to establish care.  According to chart review, patient has longstanding history of chronic systolic heart failure secondary to nonischemic cardiomyopathy with device placement in 2004.  She had an upgrade for CRT-D roughly around 2013 and her last ischemic evaluation was in June 2021; at that time she had normal coronaries with an EF of 35 to 40%.  In January 2023 she was at admitted to [facility] health with decompensated heart failure with echocardiogram demonstrating EF of 20% at that time she was started on low-dose GDMT however stopped some medications due to fatigue and hypotension.  She was most recently admitted to [facility] from January 5 to 11, 2024 possibly secondary to ventricular tachycardia.  Repeat echocardiogram at that time with an EF of 25 to 30%.  She most recently had an appointment with Dr. Shari Prows who mention to her that she has terminal heart failure  Interval hx:  For the past two weeks Tanya Hogan reports feeling 'much better than she did'. She was quite upset after being told that she has terminal heart failure during her general cardiology appointment.  She feels constantly fatigued. She currently uses a Kontos due to arthritis of the lower extremities.   Activity level/exercise tolerance: NYHA III, limited by advanced heart failure, lower extremity arthritis and deconditioning Chest pain/pressure: No Orthostatic lightheadedness: No Palpitations: No Lower extremity edema: No Presyncope/syncope: No Cough: No  Past Medical History:  Diagnosis Date   Alkaline phosphatase  elevation    Asthma    Breast cancer 2011   Carpal tunnel syndrome    bilateral   Chronic HFrEF (heart failure with reduced ejection fraction)    Cirrhosis, non-alcoholic    Closed fracture of unspecified part of radius (alone)    DDD (degenerative disc disease), lumbar    Depression    pt denies   Diabetes mellitus without complication    Enlarged heart    Fibromyalgia    GERD (gastroesophageal reflux disease)    History of kidney stones    HTN (hypertension)    Hypothyroidism    IBS (irritable bowel syndrome)    LBBB (left bundle branch block)    Microcytic anemia 05/14/2017   Mitral regurgitation    NICM (nonischemic cardiomyopathy)    Pneumonia    Presence of permanent cardiac pacemaker    Pulmonary hypertension    PVD (peripheral vascular disease)    2019   Sleep apnea    uses cpap   Tricuspid regurgitation    Ventricular tachyarrhythmia     Current Outpatient Medications  Medication Sig Dispense Refill   albuterol (PROVENTIL) (2.5 MG/3ML) 0.083% nebulizer solution Take 2.5 mg by nebulization every 6 (six) hours as needed for wheezing or shortness of breath.     albuterol (VENTOLIN HFA) 108 (90 Base) MCG/ACT inhaler Inhale 1-2 puffs into the lungs every 6 (six) hours as needed for wheezing or shortness of breath. 54 g 3   alendronate (FOSAMAX) 70 MG tablet TAKE 1 TABLET BY MOUTH  EVERY 7 DAYS WITH A FULL  GLASS OF WATER ON AN EMPTY  STOMACH 12 tablet 3   AMBULATORY NON FORMULARY MEDICATION Medication Name: Tubing and supplies for  nebulizer. Fax - [phone] 1 vial 0   amiodarone (PACERONE) 200 MG tablet TAKE  200 MG ONCE A DAY MONDAY THRU THURSDAY  TAKE  200 MG TWICE A DAY FRIDAY THROUGH SUNDAY 120 tablet 2   aspirin 81 MG chewable tablet Chew 81 mg by mouth daily.     bumetanide (BUMEX) 1 MG tablet Take 1 tablet (1 mg total) by mouth 2 (two) times daily. Take an extra tablet in the morning, if weight gain 2 to 3 lbs in 24 hrs or 5 lbs in 7 days. 180 tablet 1    Continuous Blood Gluc Receiver (FREESTYLE LIBRE 2 READER) DEVI 6 (six) sensors/90 day supply. 4 refills per year. DX:E11.8 6 each 4   fexofenadine (ALLEGRA) 180 MG tablet Take 180 mg by mouth in the morning.     fluticasone-salmeterol (ADVAIR HFA) 115-21 MCG/ACT inhaler Inhale 2 puffs into the lungs 2 (two) times daily.     glucose blood test strip To be used to check blood glucose Dx:E11.8 FreeStyle Precision blood glucose for Freestyle Libre 2 100 each 12   levothyroxine (SYNTHROID) 88 MCG tablet TAKE 1 TABLET(88 MCG) BY MOUTH DAILY 90 tablet 0   MAGNESIUM PO Take 1 tablet by mouth daily.     metFORMIN (GLUCOPHAGE-XR) 500 MG 24 hr tablet Take 1 tablet (500 mg total) by mouth 2 (two) times daily with a meal. 1 tablet 0   MOTEGRITY 2 MG TABS Take by mouth.     Multiple Vitamins-Minerals (OCUVITE EYE + MULTI) TABS Take 1 tablet by mouth in the morning.     Omega-3 Fatty Acids (FISH OIL) 1000 MG CAPS Take 1,000 mg by mouth daily.     pantoprazole (PROTONIX) 40 MG tablet TAKE 1 TABLET BY MOUTH DAILY  BEFORE BREAKFAST 90 tablet 3   pravastatin (PRAVACHOL) 40 MG tablet Take 1 tablet (40 mg total) by mouth every evening. 90 tablet 3   senna-docusate (SENOKOT-S) 8.6-50 MG tablet Take 1 tablet by mouth daily.     Tiotropium Bromide Monohydrate (SPIRIVA RESPIMAT) 1.25 MCG/ACT AERS Inhale 2 puffs into the lungs daily.     valACYclovir (VALTREX) 1000 MG tablet Take 1 tablet (1,000 mg total) by mouth 2 (two) times daily. 14 tablet 2   VITAMIN D PO Take 1 tablet by mouth daily.     clotrimazole-betamethasone (LOTRISONE) cream Apply 1 Application topically 2 (two) times daily. (Patient not taking: Reported on 12/14/2022)     digoxin (LANOXIN) 0.125 MG tablet Take 1 tablet (0.125 mg total) by mouth daily. 90 tablet 3   No current facility-administered medications for this encounter.    Allergies  Allergen Reactions   Injectafer [Ferric Carboxymaltose] Shortness Of Breath    Mild SOB following injectafer  infusion Mild SOB following injectafer infusion    Penicillins Anaphylaxis and Rash    Has patient had a PCN reaction causing immediate rash, facial/tongue/throat swelling, SOB or lightheadedness with hypotension: Yes Has patient had a PCN reaction causing severe rash involving mucus membranes or skin necrosis: No Has patient had a PCN reaction that required hospitalization: No Has patient had a PCN reaction occurring within the last 10 years: No If all of the above answers are "NO", then may proceed with Cephalosporin use. "Stuttered with medication"     Accupril [Quinapril Hcl] Other (See Comments)    Hyperkalemia=high potassium level   Aleve [Naproxen] Swelling   Celebrex [Celecoxib]     ulcers   Cozaar [Losartan Potassium] Itching   Flonase [Fluticasone] Other (See  Comments)    Nasal ulcer.     Levomenol Other (See Comments)    unknown unknown    Lipitor [Atorvastatin] Other (See Comments)    muscle aches/pains   Nasonex [Mometasone Furoate] Other (See Comments)    nosebleed   Xyzal [Levocetirizine] Other (See Comments)    Doesn't recall reaction type   Bentyl [Dicyclomine]    Chamomile Other (See Comments)    unknown   Entresto [Sacubitril-Valsartan]     Hyperkalemia   Vesicare [Solifenacin] Other (See Comments)    "stopped me from urinating"   Aspirin Other (See Comments)    Pt states "burns holes in stomach", ulcers    Baking Soda-Fluoride [Sodium Fluoride] Itching and Rash   Clindamycin/Lincomycin Rash   Codeine Nausea And Vomiting   Furosemide Other (See Comments)    Nose bleed   Lactose Intolerance (Gi) Other (See Comments)    Increased calcium, sneezing, nasal issues   Lanolin-Petrolatum Rash   Miralax [Polyethylene Glycol] Rash   Quinine Rash   Reclast [Zoledronic Acid] Rash and Other (See Comments)    shaking   Rifampin Other (See Comments)    DOES NOT REMEMBER THIS MEDICATION OR ALLERGY   Sulfa Antibiotics Rash   Sulfonamide Derivatives Rash    Tramadol Rash    Anxiety   Wellbutrin [Bupropion] Other (See Comments)    Muscle aches, pain, "made my skin crawl"   Wool Alcohol [Lanolin] Rash      Social History   Socioeconomic History   Marital status: Divorced    Spouse name: Not on file   Number of children: 2   Years of education: 14   Highest education level: Associate degree: academic program  Occupational History   Occupation: quality insurcance in Sports coach    Comment: retired  Tobacco Use   Smoking status: Former    Packs/day: 0.00    Years: 0.00    Additional pack years: 0.00    Total pack years: 0.00    Types: Cigarettes    Quit date: 08/24/1966    Years since quitting: 56.3   Smokeless tobacco: Never  Vaping Use   Vaping Use: Never used  Substance and Sexual Activity   Alcohol use: No   Drug use: No   Sexual activity: Not Currently  Other Topics Concern   Not on file  Social History Narrative   Retired. Lives with and helps her brother. Likes Risk manager, Nutritional therapist and music. Loves animals.   Social Determinants of Health   Financial Resource Strain: Low Risk  (08/06/2022)   Overall Financial Resource Strain (CARDIA)    Difficulty of Paying Living Expenses: Not hard at all  Food Insecurity: No Food Insecurity (11/03/2022)   Hunger Vital Sign    Worried About Running Out of Food in the Last Year: Never true    Ran Out of Food in the Last Year: Never true  Transportation Needs: No Transportation Needs (11/03/2022)   PRAPARE - Administrator, Civil Service (Medical): No    Lack of Transportation (Non-Medical): No  Physical Activity: Sufficiently Active (08/06/2022)   Exercise Vital Sign    Days of Exercise per Week: 5 days    Minutes of Exercise per Session: 60 min  Stress: No Stress Concern Present (08/06/2022)   [employer] of Occupational Health - Occupational Stress Questionnaire    Feeling of Stress : Not at all  Social Connections: Moderately Integrated  (08/06/2022)   Social Connection and Isolation Panel [NHANES]  Frequency of Communication with Friends and Family: More than three times a week    Frequency of Social Gatherings with Friends and Family: Three times a week    Attends Religious Services: More than 4 times per year    Active Member of Clubs or Organizations: Yes    Attends Banker Meetings: More than 4 times per year    Marital Status: Divorced  Intimate Partner Violence: Not At Risk (10/29/2022)   Humiliation, Afraid, Rape, and Kick questionnaire    Fear of Current or Ex-Partner: No    Emotionally Abused: No    Physically Abused: No    Sexually Abused: No      Family History  Problem Relation Age of Onset   Heart disease Mother    Melanoma Mother    Parkinson's disease Mother    Heart disease Father    Heart disease Brother    Heart failure Other    Breast cancer Other    Hypertension Other     PHYSICAL EXAM: Vitals:   12/14/22 1508  BP: 102/60  Pulse: 65  SpO2: 96%   GENERAL: Chronically ill-appearing female using [place] HEENT: Negative for arcus senilis or xanthelasma. There is no scleral icterus.  The mucous membranes are pink and moist.   NECK: Supple, No masses. Normal carotid upstrokes without bruits. No masses or thyromegaly.    CHEST: There are no chest wall deformities. There is no chest wall tenderness. Respirations are unlabored.  Lungs-CTA bilaterally CARDIAC: Unable to assess as due to body habitus         Normal S1, S2  Normal rate with regular rhythm. No murmurs, rubs or gallops.  Pulses are 2+ and symmetrical in upper and lower extremities.  No edema.  ABDOMEN: Soft, non-tender, non-distended. There are no masses or hepatomegaly. There are normal bowel sounds.  EXTREMITIES: Warm and well perfused with no cyanosis, clubbing.  LYMPHATIC: No axillary or supraclavicular lymphadenopathy.  NEUROLOGIC: Patient is oriented x3 with no focal or lateralizing neurologic deficits.  PSYCH:  Patients affect is appropriate, there is no evidence of anxiety or depression.  SKIN: Warm and dry; no lesions or wounds.   DATA REVIEW  ECG: 12/14/22: BiV paced rhythm as per my interpretation  ECHO: 10/30/22: LVEF 20-25%, moderately reduced RV function. As per my personal interpretation 01/08/22: LVEF 20 to 25%, reduced RV function.   ASSESSMENT & PLAN:  Heart failure with reduced ejection fraction Etiology of HF: Nonischemic cardiomyopathc NYHA class / AHA Stage: NYHA III, significantly limited by lower extremity arthritis and deconditioning along with likely low output heart failure Volume status & Diuretics: Euvolemic, bumex  BID Vasodilators: Unable to tolerate vasodilators Beta-Blocker:discontinue coreg, start digoxin daily with digoxin level and BMP next week.  JYN:WGNF start at follow up.  Cardiometabolic:will discontinue jardiance; reports feeling very fatigued and dehydrated with it. Devices therapies & Valvulopathies: BiV CRT-P in place Advanced therapies: Unfortunately due to her multiple comorbidities including deconditioning, arthritis, dependent on [place] for ambulation, minimal social support (she is the primary caretaker for her 23 year old brother), Tanya Hogan is not a candidate for any advanced therapies including LVAD or home inotropes.  We discussed this today.  At this time we will focus on symptom control and attempt to improve her overall functional status.  Will stop carvedilol and start digoxin 125 mcg daily.  She is euvolemic on exam.  We discussed importance of increasing diuretics as needed.  2.HTN -Now hypotensive without the need for vasodilators  3.T2DM -  A1c relatively well-controlled  4.HLD -Continue private statin  5.COPD - questionable hx of COPD - stable today, no wheezing on exam.   6.Breast Ca s/p mastectomy -No signs of recurrence  Greater than 60 minutes spent reviewing chart, reviewing echocardiograms and meeting with patient  to discuss severe of her heart failure.  Tanya Hogan Advanced [facility] Mechanical Circulatory Support

## 2022-12-15 ENCOUNTER — Telehealth: Payer: Self-pay | Admitting: Family Medicine

## 2022-12-15 DIAGNOSIS — C50911 Malignant neoplasm of unspecified site of right female breast: Secondary | ICD-10-CM | POA: Diagnosis not present

## 2022-12-15 NOTE — Progress Notes (Signed)
Remote pacemaker transmission.   

## 2022-12-15 NOTE — Telephone Encounter (Signed)
Pt called. Patient states she needs PA for her Free [name] 2(it is being handled by her insurance now instead of pharmacist)

## 2022-12-18 NOTE — Telephone Encounter (Signed)
Spoke with patient and she reports that she is off of jardiance and coreg and she feels better that she has in 2 yrs. She is now on digoxin per Dr Gasper Lloyd.

## 2022-12-20 ENCOUNTER — Telehealth: Payer: Self-pay

## 2022-12-20 NOTE — Telephone Encounter (Signed)
Initiated Prior authorization QMV:[id number] Libre 2 Reader device Via: Covermymeds Case/Key:[id number] Status: Pending as of 12/20/22 Reason: Notified Pt via: Mychart

## 2022-12-21 ENCOUNTER — Other Ambulatory Visit (HOSPITAL_COMMUNITY): Payer: Self-pay

## 2022-12-21 DIAGNOSIS — R040 Epistaxis: Secondary | ICD-10-CM | POA: Diagnosis not present

## 2022-12-21 DIAGNOSIS — I5022 Chronic systolic (congestive) heart failure: Secondary | ICD-10-CM

## 2022-12-22 DIAGNOSIS — Z7984 Long term (current) use of oral hypoglycemic drugs: Secondary | ICD-10-CM | POA: Diagnosis not present

## 2022-12-22 DIAGNOSIS — E1159 Type 2 diabetes mellitus with other circulatory complications: Secondary | ICD-10-CM | POA: Diagnosis not present

## 2022-12-22 DIAGNOSIS — I5020 Unspecified systolic (congestive) heart failure: Secondary | ICD-10-CM | POA: Diagnosis not present

## 2022-12-22 LAB — BASIC METABOLIC PANEL
BUN/Creatinine Ratio: 16 (ref 12–28)
BUN: 16 mg/dL (ref 8–27)
CO2: 24 mmol/L (ref 20–29)
Calcium: 10.3 mg/dL (ref 8.7–10.3)
Chloride: 100 mmol/L (ref 96–106)
Creatinine, Ser: 0.99 mg/dL (ref 0.57–1.00)
Glucose: 84 mg/dL (ref 70–99)
Potassium: 5.1 mmol/L (ref 3.5–5.2)
Sodium: 141 mmol/L (ref 134–144)
eGFR: 60 mL/min/{1.73_m2} (ref >59)

## 2022-12-22 LAB — DIGOXIN LEVEL: Digoxin, Serum: 0.8 ng/mL (ref 0.5–0.9)

## 2022-12-24 ENCOUNTER — Encounter (HOSPITAL_BASED_OUTPATIENT_CLINIC_OR_DEPARTMENT_OTHER): Payer: Self-pay | Admitting: Emergency Medicine

## 2022-12-24 ENCOUNTER — Emergency Department (HOSPITAL_BASED_OUTPATIENT_CLINIC_OR_DEPARTMENT_OTHER)
Admission: EM | Admit: 2022-12-24 | Discharge: 2022-12-24 | Disposition: A | Discharge status: Home / Self Care | Payer: 59 | Attending: Emergency Medicine | Admitting: Emergency Medicine

## 2022-12-24 ENCOUNTER — Other Ambulatory Visit: Payer: Self-pay

## 2022-12-24 ENCOUNTER — Telehealth (HOSPITAL_COMMUNITY): Payer: Self-pay

## 2022-12-24 DIAGNOSIS — Z7982 Long term (current) use of aspirin: Secondary | ICD-10-CM | POA: Insufficient documentation

## 2022-12-24 DIAGNOSIS — Z7901 Long term (current) use of anticoagulants: Secondary | ICD-10-CM | POA: Diagnosis not present

## 2022-12-24 DIAGNOSIS — F17210 Nicotine dependence, cigarettes, uncomplicated: Secondary | ICD-10-CM | POA: Diagnosis not present

## 2022-12-24 DIAGNOSIS — Z7984 Long term (current) use of oral hypoglycemic drugs: Secondary | ICD-10-CM | POA: Diagnosis not present

## 2022-12-24 DIAGNOSIS — Z79899 Other long term (current) drug therapy: Secondary | ICD-10-CM | POA: Diagnosis not present

## 2022-12-24 DIAGNOSIS — I5084 End stage heart failure: Secondary | ICD-10-CM | POA: Insufficient documentation

## 2022-12-24 DIAGNOSIS — E119 Type 2 diabetes mellitus without complications: Secondary | ICD-10-CM | POA: Insufficient documentation

## 2022-12-24 DIAGNOSIS — I110 Hypertensive heart disease with heart failure: Secondary | ICD-10-CM | POA: Diagnosis not present

## 2022-12-24 DIAGNOSIS — R21 Rash and other nonspecific skin eruption: Secondary | ICD-10-CM | POA: Diagnosis not present

## 2022-12-24 DIAGNOSIS — J449 Chronic obstructive pulmonary disease, unspecified: Secondary | ICD-10-CM | POA: Diagnosis not present

## 2022-12-24 DIAGNOSIS — Z7951 Long term (current) use of inhaled steroids: Secondary | ICD-10-CM | POA: Diagnosis not present

## 2022-12-24 DIAGNOSIS — E118 Type 2 diabetes mellitus with unspecified complications: Secondary | ICD-10-CM | POA: Diagnosis not present

## 2022-12-24 DIAGNOSIS — Z7952 Long term (current) use of systemic steroids: Secondary | ICD-10-CM | POA: Diagnosis not present

## 2022-12-24 MED ORDER — MUPIROCIN CALCIUM 2 % EX CREA: TOPICAL_CREAM | Freq: Once | CUTANEOUS | Status: DC

## 2022-12-24 MED ORDER — MUPIROCIN 2 % EX OINT
TOPICAL_OINTMENT | Freq: Once | CUTANEOUS | Status: AC
  Filled 2022-12-24: qty 22

## 2022-12-24 MED ORDER — MUPIROCIN CALCIUM 2 % EX CREA: 1.0000 | TOPICAL_CREAM | Freq: Two times a day (BID) | CUTANEOUS | 0 refills | Status: DC

## 2022-12-24 NOTE — Telephone Encounter (Signed)
Please call.  Suspect she maybe allergic to digoxin.  Stop digoxin.   Ok to use hydrocortisone. If no improvement call back   Brayen Bunn NP-C  12:17 PM

## 2022-12-24 NOTE — Telephone Encounter (Signed)
Agree ED evaluation.   Rinoa Garramone NP-C  12:32 PM

## 2022-12-24 NOTE — Discharge Instructions (Addendum)
You seen in the ER today for evaluation of your rash.  This is likely a reaction from your medication or other type of reaction.  I would like for you to take Benadryl every 8 hours as needed for itching.  This will make you sleepy so please be aware.  I am also prescribe you some mupirocin ointment for you to apply to your face and arm as well.  You can also try the Benadryl spray that I showed you to help.  Recommend following with your PCP for reevaluation of this.  If you have any chest pain, shortness of breath, nausea, vomiting, belly pain, trouble swallowing, trouble breathing, worsening rash, blurry vision, please return to the nearest emergency department.  If you have any concerns, new or worsening symptoms, please return to the nearest emergency department for evaluation.  Contact a doctor if: You sweat at night. You lose weight. You pee (urinate) more than normal. You pee less than normal, or you notice that your pee is a darker color than normal. You feel weak. You throw up (vomit). Your skin or the whites of your eyes look yellow (jaundice). Your skin: Tingles. Is numb. Your rash: Does not go away after a few days. Gets worse. You are: More thirsty than normal. More tired than normal. You have: New symptoms. Pain in your belly (abdomen). A fever. Watery poop (diarrhea). Get help right away if: You have a fever and your symptoms suddenly get worse. You start to feel mixed up (confused). You have a very bad headache or a stiff neck. You have very bad joint pains or stiffness. You have jerky movements that you cannot control (seizure). Your rash covers all or most of your body. The rash may or may not be painful. You have blisters that: Are on top of the rash. Grow larger. Grow together. Are painful. Are inside your nose or mouth. You have a rash that: Looks like purple pinprick-sized spots all over your body. Has a "bull's eye" or looks like a target. Is red and  painful, causes your skin to peel, and is not from being in the sun too long.

## 2022-12-24 NOTE — ED Triage Notes (Signed)
Reports her Dr started her on Digoxin on 12/14/22, started having rash hives 5 days ago , took benadryl and felt better , afraid to take digoxin . Here to get some help with itching .

## 2022-12-24 NOTE — Telephone Encounter (Signed)
Patient called and states she has broken out in a rash all over after starting Digoxin. She says her face and arms are broken out, itching, and now look like blisters coming up.  She tried Benadryl but has not helped. She also wanted to know if it would be safe for her to go get a cortisone injection to try and clear it up. Please advise.

## 2022-12-24 NOTE — ED Provider Notes (Signed)
[place] [facility] Provider Note   CSN: [id number] Arrival date & time: 12/24/22  1400     History Chief Complaint  Patient presents with   Rash    Tanya Hogan is a 72 y.o. female with history of diabetes, CHF end-stage, GERD, hypertension presents the emergency room today for evaluation of rash for the past 5 days.  On April 22, the patient reports that she was started on digoxin for her heart failure.  A few days later she noted this rash to her lower right wrist but is maturely on her left wrist.  She reports it is itching and not painful.  She reports that she woke up this morning and it was on her left face.  She denies any recent fevers or cough or cold symptoms.  Denies any blurry vision.  She denies any chest pain or shortness of breath worse than her baseline.  She has been trying topical Desitin, taking Benadryl without much relief of her symptoms.  She reports that her itching is unbearable.  She denies that the rash is painful.  No new soaps, lotions, or foods.  She has multiple allergies to medications.   Rash Associated symptoms: no abdominal pain, no fever, no nausea, no shortness of breath and not vomiting        Home Medications Prior to Admission medications   Medication Sig Start Date End Date Taking? Authorizing Provider  albuterol (PROVENTIL) (2.5 MG/3ML) 0.083% nebulizer solution Take 2.5 mg by nebulization every 6 (six) hours as needed for wheezing or shortness of breath.    [provider]  albuterol (VENTOLIN HFA) 108 (90 Base) MCG/ACT inhaler Inhale 1-2 puffs into the lungs every 6 (six) hours as needed for wheezing or shortness of breath. 05/27/21   [name], MD  alendronate (FOSAMAX) 70 MG tablet TAKE 1 TABLET BY MOUTH  EVERY 7 DAYS WITH A FULL  GLASS OF WATER ON AN EMPTY  STOMACH 06/18/22   [name], MD  AMBULATORY NON FORMULARY MEDICATION Medication Name: Tubing and supplies for  nebulizer. Fax - [phone] 08/14/19   [name], MD  amiodarone (PACERONE) 200 MG tablet TAKE  200 MG ONCE A DAY MONDAY THRU THURSDAY  TAKE  200 MG TWICE A DAY FRIDAY THROUGH SUNDAY 10/09/22   Sheilah Pigeon, PA-C  aspirin 81 MG chewable tablet Chew 81 mg by mouth daily.    [provider]  bumetanide (BUMEX) 1 MG tablet Take 1 tablet (1 mg total) by mouth 2 (two) times daily. Take an extra tablet in the morning, if weight gain 2 to 3 lbs in 24 hrs or 5 lbs in 7 days. 11/01/22   Arrien, York Ram, MD  clotrimazole-betamethasone (LOTRISONE) cream Apply 1 Application topically 2 (two) times daily. Patient not taking: Reported on 12/14/2022 09/03/22 09/03/23  [provider]  Continuous Blood Gluc Receiver (FREESTYLE LIBRE 2 READER) DEVI 6 (six) sensors/90 day supply. 4 refills per year. DX:E11.8 10/06/22   [name], MD  digoxin (LANOXIN) 0.125 MG tablet Take 1 tablet (0.125 mg total) by mouth daily. 12/14/22   Sabharwal, Aditya, DO  fexofenadine (ALLEGRA) 180 MG tablet Take 180 mg by mouth in the morning.    [provider]  fluticasone-salmeterol (ADVAIR HFA) 115-21 MCG/ACT inhaler Inhale 2 puffs into the lungs 2 (two) times daily.    [provider]  glucose blood test strip To be used to check blood glucose Dx:E11.8 FreeStyle  Precision blood glucose for Freestyle Libre 2 08/25/22   [name], MD  levothyroxine (SYNTHROID) 88 MCG tablet TAKE 1 TABLET(88 MCG) BY MOUTH DAILY 09/22/22   [name], MD  MAGNESIUM PO Take 1 tablet by mouth daily.    [provider]  metFORMIN (GLUCOPHAGE-XR) 500 MG 24 hr tablet Take 1 tablet (500 mg total) by mouth 2 (two) times daily with a meal. 11/25/22   [name], Barbarann Ehlers, MD  MOTEGRITY 2 MG TABS Take by mouth.    [provider]  Multiple Vitamins-Minerals (OCUVITE EYE + MULTI) TABS Take 1 tablet by mouth in the morning.    [provider]  Omega-3  Fatty Acids (FISH OIL) 1000 MG CAPS Take 1,000 mg by mouth daily.    [provider]  pantoprazole (PROTONIX) 40 MG tablet TAKE 1 TABLET BY MOUTH DAILY  BEFORE BREAKFAST 10/20/22   [name], MD  pravastatin (PRAVACHOL) 40 MG tablet Take 1 tablet (40 mg total) by mouth every evening. 09/09/22   Dunn, [name] N, PA-C  senna-docusate (SENOKOT-S) 8.6-50 MG tablet Take 1 tablet by mouth daily.    [provider]  Tiotropium Bromide Monohydrate (SPIRIVA RESPIMAT) 1.25 MCG/ACT AERS Inhale 2 puffs into the lungs daily.    [provider]  valACYclovir (VALTREX) 1000 MG tablet Take 1 tablet (1,000 mg total) by mouth 2 (two) times daily. 11/04/22   [name], MD  VITAMIN D PO Take 1 tablet by mouth daily.    [provider]      Allergies    Injectafer [ferric carboxymaltose], Penicillins, Accupril [quinapril hcl], Aleve [naproxen], Celebrex [celecoxib], Cozaar [losartan potassium], Flonase [fluticasone], Levomenol, Lipitor [atorvastatin], Nasonex [mometasone furoate], Xyzal [levocetirizine], Bentyl [dicyclomine], Chamomile, Entresto [sacubitril-valsartan], Vesicare [solifenacin], Aspirin, Baking soda-fluoride [sodium fluoride], Clindamycin/lincomycin, Codeine, Furosemide, Lactose intolerance (gi), Lanolin-petrolatum, Miralax [polyethylene glycol], Quinine, Reclast [zoledronic acid], Rifampin, Sulfa antibiotics, Sulfonamide derivatives, Tramadol, Wellbutrin [bupropion], and Wool alcohol [lanolin]    Review of Systems   Review of Systems  Constitutional:  Negative for chills and fever.  HENT:  Negative for trouble swallowing.   Respiratory:  Negative for shortness of breath.   Cardiovascular:  Negative for chest pain.  Gastrointestinal:  Negative for abdominal pain, nausea and vomiting.  Skin:  Positive for rash.    Physical Exam Updated Vital Signs BP (!) 155/68 (BP Location: Right Arm)   Pulse 77   Temp 98 F (36.7 C) (Oral)   Resp 20   Wt  100.5 kg   SpO2 98%   BMI 41.86 kg/m  Physical Exam Vitals and nursing note reviewed.  Constitutional:      General: She is not in acute distress.    Appearance: Normal appearance. She is not toxic-appearing.  Eyes:     General: No scleral icterus. Pulmonary:     Effort: Pulmonary effort is normal. No respiratory distress.  Skin:    General: Skin is dry.     Findings: Rash present.     Comments: Macular papular rash seen to the left side of the face and the left volar aspect of the forearm.  Few vesicles noted however no skin sloughing.  Not painful to palpation.  On the face, there is some dry skin noted around the eye.  She does not have any pain with movement of her eye however.  Neurological:     General: No focal deficit present.     Mental Status: She is alert. Mental status is at baseline.  Psychiatric:  Mood and Affect: Mood normal.              ED Results / Procedures / Treatments   Labs (all labs ordered are listed, but only abnormal results are displayed) Labs Reviewed - No data to display  EKG None  Radiology No results found.  Procedures Procedures   Medications Ordered in ED Medications - No data to display  ED Course/ Medical Decision Making/ A&P    Medical Decision Making  72 y.o. female presents to the ER today for evaluation of itching rash. Differential diagnosis includes but is not limited to shingles, SJS, TENS, drug reaction, contact dermatitis, eczema. Vital signs show mildly elevated blood pressure otherwise unremarkable. Physical exam as noted above.  Please see images above.  Macular papular rash seen to the left side of the face and the left volar aspect of the forearm.  Few vesicles noted however no skin sloughing.  Not painful to palpation.  On the face, there is some dry skin noted around the eye.  She does not have any pain with movement of her eye however.  I doubt any shingles given this is not in a dermatomal pattern.   Less likely SJS or TENS given percentage body coverage.  There is also lack of skin sloughing.  No target pattern or bull's-eye pattern, less likely Lyme disease.  There are drug reactions associated with digoxin.  Given the patient is has greater than 30 allergies with the majority of the reactions being rash.  She has already spoken with her cardiologist who told her to discontinue the digoxin. She has been on prednisone before from her multiple rashes she has had from medication and reports it makes her sugars "very high" and she has to work hard on getting them down. Given her diabetes, hesitant to give her any prednisone as I do not want to cause hyperglycemia for her.  She is already taking a daily allegra. Will advise to take Benadryl and hydrate her skin with Vaseline. Will prescribe her a topical antibiotic cream as well.   We discussed plan at bedside. We discussed strict return precautions and red flag symptoms. The patient verbalized their understanding and agrees to the plan. The patient is stable and being discharged home in good condition.  I discussed this case with my attending physician who cosigned this note including patient's presenting symptoms, physical exam, and planned diagnostics and interventions. Attending physician stated agreement with plan or made changes to plan which were implemented.   Portions of this report may have been transcribed using voice recognition software. Every effort was made to ensure accuracy; however, inadvertent computerized transcription errors may be present.   Final Clinical Impression(s) / ED Diagnoses Final diagnoses:  Rash    Rx / DC Orders ED Discharge Orders     None         Achille Rich, PA-C 12/24/22 1645    Long, Arlyss Repress, MD 12/24/22 1811

## 2022-12-24 NOTE — Telephone Encounter (Signed)
I spoke to patient and she said it is spreading worse and is getting in to her eyes. She is going to ED to be evaluated just to be safe with

## 2022-12-26 ENCOUNTER — Telehealth: Payer: Self-pay | Admitting: Cardiology

## 2022-12-26 ENCOUNTER — Encounter (HOSPITAL_BASED_OUTPATIENT_CLINIC_OR_DEPARTMENT_OTHER): Payer: Self-pay | Admitting: Emergency Medicine

## 2022-12-26 ENCOUNTER — Emergency Department (HOSPITAL_BASED_OUTPATIENT_CLINIC_OR_DEPARTMENT_OTHER)
Admission: EM | Admit: 2022-12-26 | Discharge: 2022-12-26 | Disposition: A | Discharge status: Home / Self Care | Payer: 59 | Attending: Emergency Medicine | Admitting: Emergency Medicine

## 2022-12-26 ENCOUNTER — Other Ambulatory Visit: Payer: Self-pay

## 2022-12-26 DIAGNOSIS — Z7984 Long term (current) use of oral hypoglycemic drugs: Secondary | ICD-10-CM | POA: Insufficient documentation

## 2022-12-26 DIAGNOSIS — B029 Zoster without complications: Secondary | ICD-10-CM | POA: Diagnosis not present

## 2022-12-26 DIAGNOSIS — E119 Type 2 diabetes mellitus without complications: Secondary | ICD-10-CM | POA: Insufficient documentation

## 2022-12-26 DIAGNOSIS — R21 Rash and other nonspecific skin eruption: Secondary | ICD-10-CM | POA: Diagnosis present

## 2022-12-26 MED ORDER — METHYLPREDNISOLONE 4 MG PO TBPK: ORAL_TABLET | ORAL | 0 refills | Status: DC

## 2022-12-26 MED ORDER — VALACYCLOVIR HCL 1 G PO TABS: 1000.0000 mg | ORAL_TABLET | Freq: Three times a day (TID) | ORAL | 0 refills | Status: AC

## 2022-12-26 MED ORDER — VALACYCLOVIR HCL 500 MG PO TABS
1000.0000 mg | ORAL_TABLET | Freq: Every day | ORAL | Status: DC
  Administered 2022-12-26: 1000 mg via ORAL
  Filled 2022-12-26: qty 2

## 2022-12-26 NOTE — Discharge Instructions (Addendum)
Instructions:  The rash that you are having today looks like a possible shingles rash.  Therefore I have started you on an antiviral pill called Valtrex which she will take 3 times a day as prescribed.  We also discussed starting you on a low-dose of oral steroids, some Medrol Dosepak.  Take these once a day as discussed, in the morning.  Please continue to check your blood sugars at home.  If your blood sugar goes over 350, STOP taking the prednisone/steroids.  I also recommend that you stop using the mupirocin ointment and hydrocortisone creams on your skin.  These creams may in fact be irritating your skin.  You can continue using Benadryl as needed for itching and burning.  Try to schedule follow-up appointment if you are able to with your doctors office this week.

## 2022-12-26 NOTE — ED Provider Notes (Signed)
[place] [facility] Provider Note   CSN: [id number] Arrival date & time: 12/26/22  1042     History  Chief Complaint  Patient presents with   Rash    Tanya Hogan is a 72 y.o. female presented to ED with complaint of persistent rash on her face and arms.  Patient was seen in the emergency department 2 days ago after noting an erythematous and itchy rash involving the bilateral forearms as well as the face.  This was felt to be potentially secondary to a drug allergy, as the patient been started on digoxin 12/14/22, and her rash began 5 days afterwards.  The patient reports that she discontinued digoxin at the request of her cardiologist on 12/24/22, the day she came into the ED   In the ER she was prescribed mupirocin ointment, hydrocortisone ointment, as well as advised to take Benadryl at home.  She has been applying the ointment to her skin into her face being careful to avoid her eyes.  She feels that the rash worsened overnight, and is now burning and itching with small blistering of her bilateral forearms.  HPI     Home Medications Prior to Admission medications   Medication Sig Start Date End Date Taking? Authorizing Provider  methylPREDNISolone (MEDROL DOSEPAK) 4 MG TBPK tablet Take as directed on package 12/26/22  Yes Henlee Donovan, Kermit Balo, MD  valACYclovir (VALTREX) 1000 MG tablet Take 1 tablet (1,000 mg total) by mouth 3 (three) times daily for 7 days. 12/26/22 01/02/23 Yes Amabel Stmarie, Kermit Balo, MD  albuterol (PROVENTIL) (2.5 MG/3ML) 0.083% nebulizer solution Take 2.5 mg by nebulization every 6 (six) hours as needed for wheezing or shortness of breath.    [provider]  albuterol (VENTOLIN HFA) 108 (90 Base) MCG/ACT inhaler Inhale 1-2 puffs into the lungs every 6 (six) hours as needed for wheezing or shortness of breath. 05/27/21   [name], MD  alendronate (FOSAMAX) 70 MG tablet TAKE 1 TABLET BY MOUTH  EVERY 7 DAYS WITH A FULL   GLASS OF WATER ON AN EMPTY  STOMACH 06/18/22   [name], MD  AMBULATORY NON FORMULARY MEDICATION Medication Name: Tubing and supplies for nebulizer. Fax - [phone] 08/14/19   [name], MD  amiodarone (PACERONE) 200 MG tablet TAKE  200 MG ONCE A DAY MONDAY THRU THURSDAY  TAKE  200 MG TWICE A DAY FRIDAY THROUGH SUNDAY 10/09/22   Sheilah Pigeon, PA-C  aspirin 81 MG chewable tablet Chew 81 mg by mouth daily.    [provider]  bumetanide (BUMEX) 1 MG tablet Take 1 tablet (1 mg total) by mouth 2 (two) times daily. Take an extra tablet in the morning, if weight gain 2 to 3 lbs in 24 hrs or 5 lbs in 7 days. 11/01/22   Arrien, York Ram, MD  clotrimazole-betamethasone (LOTRISONE) cream Apply 1 Application topically 2 (two) times daily. Patient not taking: Reported on 12/14/2022 09/03/22 09/03/23  [provider]  Continuous Blood Gluc Receiver (FREESTYLE LIBRE 2 READER) DEVI 6 (six) sensors/90 day supply. 4 refills per year. DX:E11.8 10/06/22   [name], MD  digoxin (LANOXIN) 0.125 MG tablet Take 1 tablet (0.125 mg total) by mouth daily. 12/14/22   Sabharwal, Aditya, DO  fexofenadine (ALLEGRA) 180 MG tablet Take 180 mg by mouth in the morning.    [provider]  fluticasone-salmeterol (ADVAIR HFA) 115-21 MCG/ACT inhaler Inhale 2 puffs into the lungs 2 (two) times daily.  [provider]  glucose blood test strip To be used to check blood glucose Dx:E11.8 FreeStyle Precision blood glucose for Freestyle Libre 2 08/25/22   [name], MD  levothyroxine (SYNTHROID) 88 MCG tablet TAKE 1 TABLET(88 MCG) BY MOUTH DAILY 09/22/22   [name], MD  MAGNESIUM PO Take 1 tablet by mouth daily.    [provider]  metFORMIN (GLUCOPHAGE-XR) 500 MG 24 hr tablet Take 1 tablet (500 mg total) by mouth 2 (two) times daily with a meal. 11/25/22   [name], Barbarann Ehlers, MD  MOTEGRITY 2 MG TABS Take by mouth.    [provider]  Multiple Vitamins-Minerals (OCUVITE EYE + MULTI) TABS Take 1 tablet by mouth in the morning.    [provider]  mupirocin cream (BACTROBAN) 2 % Apply 1 Application topically 2 (two) times daily. 12/24/22   Achille Rich, PA-C  Omega-3 Fatty Acids (FISH OIL) 1000 MG CAPS Take 1,000 mg by mouth daily.    [provider]  pantoprazole (PROTONIX) 40 MG tablet TAKE 1 TABLET BY MOUTH DAILY  BEFORE BREAKFAST 10/20/22   [name], MD  pravastatin (PRAVACHOL) 40 MG tablet Take 1 tablet (40 mg total) by mouth every evening. 09/09/22   Dunn, [name] N, PA-C  senna-docusate (SENOKOT-S) 8.6-50 MG tablet Take 1 tablet by mouth daily.    [provider]  Tiotropium Bromide Monohydrate (SPIRIVA RESPIMAT) 1.25 MCG/ACT AERS Inhale 2 puffs into the lungs daily.    [provider]  valACYclovir (VALTREX) 1000 MG tablet Take 1 tablet (1,000 mg total) by mouth 2 (two) times daily. 11/04/22   [name], MD  VITAMIN D PO Take 1 tablet by mouth daily.    [provider]      Allergies    Injectafer [ferric carboxymaltose], Penicillins, Accupril [quinapril hcl], Aleve [naproxen], Celebrex [celecoxib], Cozaar [losartan potassium], Flonase [fluticasone], Levomenol, Lipitor [atorvastatin], Nasonex [mometasone furoate], Xyzal [levocetirizine], Bentyl [dicyclomine], Chamomile, Entresto [sacubitril-valsartan], Vesicare [solifenacin], Aspirin, Baking soda-fluoride [sodium fluoride], Clindamycin/lincomycin, Codeine, Furosemide, Lactose intolerance (gi), Lanolin-petrolatum, Miralax [polyethylene glycol], Quinine, Reclast [zoledronic acid], Rifampin, Sulfa antibiotics, Sulfonamide derivatives, Tramadol, Wellbutrin [bupropion], and Wool alcohol [lanolin]    Review of Systems   Review of Systems  Physical Exam Updated Vital Signs BP 120/66   Pulse 81   Temp 97.7 F (36.5 C) (Oral)   Resp 20   SpO2 94%  Physical Exam Constitutional:      General:  She is not in acute distress. HENT:     Head: Normocephalic and atraumatic.  Eyes:     Conjunctiva/sclera: Conjunctivae normal.     Pupils: Pupils are equal, round, and reactive to light.  Cardiovascular:     Rate and Rhythm: Normal rate and regular rhythm.  Pulmonary:     Effort: Pulmonary effort is normal. No respiratory distress.  Abdominal:     General: There is no distension.     Tenderness: There is no abdominal tenderness.  Skin:    General: Skin is warm and dry.     Comments: Erythematous rash with small region of vesicles now noted on the left and right forearm Erythematous rash involving the left half of the face, largely unchanged from prior media image on 12/24/22, with no ocular involvement  Neurological:     General: No focal deficit present.     Mental Status: She is alert. Mental status is at baseline.  Psychiatric:        Mood and Affect: Mood normal.  Behavior: Behavior normal.     ED Results / Procedures / Treatments   Labs (all labs ordered are listed, but only abnormal results are displayed) Labs Reviewed - No data to display  EKG None  Radiology No results found.  Procedures Procedures    Medications Ordered in ED Medications  valACYclovir (VALTREX) tablet 1,000 mg (1,000 mg Oral Given 12/26/22 1144)    ED Course/ Medical Decision Making/ A&P                             Medical Decision Making Risk Prescription drug management.   Patient is here with persistent painful burning rash now involving extremities and the face.  On exam today this is much more clearly consistent with herpes zoster, or shingles.  The patient has had herpes "outbreaks around her mouth" but never a specific shingles rash in the past.  I would advise that she discontinue the mupirocin ointment.  Given her extensive list of drug allergies causing a rash, I wonder whether she is having a reactivity to this ointment.  It appears that where she is applying it is even  more erythematous today.  I do not think this is impetigo clinically at this time.  She may have also been experiencing a drug rash from the digoxin, in which case, typically these rashes resolved within 1 to 2 weeks.  I do not see evidence of Stevens-Johnson syndrome at this time.  No evidence of a herpes zoster ophthalmicus.  I think she might benefit from a course of steroids but the patient is reluctant to do this as she is diabetic.  On metformin.  A1c level 6.2 in March 2024 per my review of her external records.  We discussed the risks and benefits of steroid medications, as I think systemic steroids would benefit her, and she will start an attempt at a lower dose, for the next 4 days, a medrol dose pack.  If blood sugars are elevating above 350 I advised her to stop these medications.  We will start her on Valtrex today.  Advise PCP f/u.         Final Clinical Impression(s) / ED Diagnoses Final diagnoses:  Herpes zoster without complication  Rash    Rx / DC Orders ED Discharge Orders          Ordered    methylPREDNISolone (MEDROL DOSEPAK) 4 MG TBPK tablet        12/26/22 1134    valACYclovir (VALTREX) 1000 MG tablet  3 times daily        12/26/22 1134              Terald Sleeper, MD 12/26/22 1218

## 2022-12-26 NOTE — Telephone Encounter (Signed)
Patient called in reporting worsening rash.  She was started on digoxin 4/22 and states that she noticed a rash several days later.  She was seen in the ED on 5/2 with rash to her face and arm.  She was given Bactroban and hydrocortisone cream as well as instructed to take Benadryl.  She feels that the rash has become worse since starting the topical creams.  Now states it is under her chin and onto her chest as well as her extremities.  Has attempted to take Benadryl with no significant improvement.  States areas are hot and inflamed as well as red.  I advised she should be seen again to determine there is no concern for infection.  Suspect she may need IV meds given no improvement with oral or topical treatments.  She voiced understanding and thanked me for call back.

## 2022-12-26 NOTE — ED Triage Notes (Signed)
Patient returns due to continues to have rash to face, arms and chest.

## 2022-12-27 NOTE — Telephone Encounter (Signed)
Good.  Continue.

## 2022-12-28 ENCOUNTER — Encounter: Payer: Self-pay | Admitting: Family Medicine

## 2022-12-28 ENCOUNTER — Ambulatory Visit (INDEPENDENT_AMBULATORY_CARE_PROVIDER_SITE_OTHER): Payer: 59 | Admitting: Family Medicine

## 2022-12-28 VITALS — BP 137/54 | HR 84 | Ht 61.0 in | Wt 222.0 lb

## 2022-12-28 DIAGNOSIS — E785 Hyperlipidemia, unspecified: Secondary | ICD-10-CM | POA: Diagnosis not present

## 2022-12-28 DIAGNOSIS — Z7984 Long term (current) use of oral hypoglycemic drugs: Secondary | ICD-10-CM | POA: Diagnosis not present

## 2022-12-28 DIAGNOSIS — E1169 Type 2 diabetes mellitus with other specified complication: Secondary | ICD-10-CM | POA: Diagnosis not present

## 2022-12-28 DIAGNOSIS — R21 Rash and other nonspecific skin eruption: Secondary | ICD-10-CM

## 2022-12-28 NOTE — Addendum Note (Signed)
Addended by: Nani Gasser D on: 12/28/2022 05:10 PM   Modules accepted: Level of Service

## 2022-12-28 NOTE — Progress Notes (Addendum)
   Acute Office Visit  Subjective:     Patient ID: Tanya Hogan, female    DOB: 1950/12/02, 72 y.o.   MRN: [id number]  Chief Complaint  Patient presents with   shingles rash    HPI Patient is in today for rash.  She is here for today for rash that started around May 2 she had just been started on digoxin on April 22.  She called the cardiology office and they had her stop it.  She been try to use topical dose again and Benadryl.  But was very itchy and painful so went to the emergency department .  The area initially affected was left side of her face and her left forearm.  She now has affected areas on her right forearm right upper and left upper chest, across her lower abdomen and into the groin crease area.  Went back to the emergency department on the fourth and was started on steroids and valacyclovir.  He says in retrospect she did change dryer sheets.  Sugars been running up to 200 on the steroids. She brought in glucometer  ROS      Objective:    BP (!) 137/54   Pulse 84   Ht 5\' 1"  (1.549 m)   Wt 222 lb (100.7 kg)   SpO2 95%   BMI 41.95 kg/m    Physical Exam Skin:    Comments: Is an erythematous maculopapular rash on the left side of her facial cheek and her left upper eyelid.  She has a similar rash on her left forearm that is covering quite a large area and a smaller area on her right forearm she has similar area on her right and upper left chest.  With some scattered petechiae out from the edges of the lesions.     No results found for any visits on 12/28/22.      Assessment & Plan:   Problem List Items Addressed This Visit       Endocrine   Type 2 diabetes mellitus with hyperlipidemia (HCC)   Other Visit Diagnoses     Rash    -  Primary      Rash-unclear etiology.  No sign of denuding skin consistent with Stevens-Johnson.  I do not see any vesicles consistent with herpes zoster and it is more widespread at this point.  She did remember as we were  discussing potential causes that she had switched dryer sheets that she said she will illuminate that.  She is already been off the digoxin for almost 4 days at this point.  The only other new medication was Motegrity but I did not see rash listed as a potential side effect.  Okay to use topical calamine to help soothe the skin and she is also keeping it well-hydrated with Vaseline.  Go ahead and complete steroid she has about another 3 to 4 days left.  Encouraged her to call me at the end of the week to see if she is continuing to improve or not.  DM- only has a few more days on the steroid so I think she will do ok, without addition of insulin.  7 day ave of 142 on glucometer.   No orders of the defined types were placed in this encounter.   No follow-ups on file.  Nani Gasser, MD

## 2022-12-30 ENCOUNTER — Telehealth: Payer: Self-pay

## 2022-12-30 DIAGNOSIS — E119 Type 2 diabetes mellitus without complications: Secondary | ICD-10-CM | POA: Diagnosis not present

## 2022-12-30 DIAGNOSIS — L602 Onychogryphosis: Secondary | ICD-10-CM | POA: Diagnosis not present

## 2022-12-30 DIAGNOSIS — M79671 Pain in right foot: Secondary | ICD-10-CM | POA: Diagnosis not present

## 2022-12-30 DIAGNOSIS — L84 Corns and callosities: Secondary | ICD-10-CM | POA: Diagnosis not present

## 2022-12-30 DIAGNOSIS — M79672 Pain in left foot: Secondary | ICD-10-CM | POA: Diagnosis not present

## 2022-12-30 NOTE — Telephone Encounter (Signed)
Patient advised.

## 2022-12-30 NOTE — Telephone Encounter (Signed)
Tanya Hogan called and states she has washed all her clothes and she still has a rash. She believes it is the Motegrity. She would like to stop taking it for a week to see if her rash clears up.

## 2022-12-30 NOTE — Telephone Encounter (Signed)
OK to stop the Motegrity and we can see if rash improves over the next week.

## 2022-12-31 ENCOUNTER — Telehealth: Payer: Self-pay

## 2022-12-31 DIAGNOSIS — G4733 Obstructive sleep apnea (adult) (pediatric): Secondary | ICD-10-CM | POA: Diagnosis not present

## 2022-12-31 DIAGNOSIS — G4737 Central sleep apnea in conditions classified elsewhere: Secondary | ICD-10-CM | POA: Diagnosis not present

## 2022-12-31 NOTE — Telephone Encounter (Signed)
Okay, will discontinue from her medication list. We can cal the company to cancel further orders.  I suspect we can give the med as samples to someone else. See what the company says.

## 2022-12-31 NOTE — Telephone Encounter (Signed)
Forwarding to [place] as an FYI.  Archie Patten) [employer] PAP shipment for [employer] 2 mg dose (5 boxes) received this morning. Please contact the patient to come and pick up their order today. Placed in the fridge with patient identifier. Thanks in advance.   NDC: 1610-9604-54 LOT: [id number] EXP: 2024-06-23

## 2022-12-31 NOTE — Telephone Encounter (Signed)
Called pt and she DOES NOT want to continue taking the Ozempic.    However, she would like to continue working with you with regards to her medications when needed.   So do we have to send these back to the company? Or can we give these to our patients that may need samples???

## 2023-01-01 ENCOUNTER — Ambulatory Visit: Payer: 59 | Admitting: Family Medicine

## 2023-01-01 DIAGNOSIS — G4733 Obstructive sleep apnea (adult) (pediatric): Secondary | ICD-10-CM | POA: Diagnosis not present

## 2023-01-01 NOTE — Progress Notes (Signed)
Care Coordination  Will engage rx med assistance team to discontinue future orders for patient assistance program.   Will also outreach [name] to review medications and continue following her at a frequency/pace that is best clinically for her ongoing care as well as her desires for pharmacist involvement.  Lynnda Shields, PharmD, BCPS Clinical Pharmacist [facility]

## 2023-01-02 ENCOUNTER — Encounter: Payer: Self-pay | Admitting: Family Medicine

## 2023-01-04 ENCOUNTER — Telehealth: Payer: Self-pay | Admitting: Pharmacist

## 2023-01-04 ENCOUNTER — Other Ambulatory Visit: Payer: 59 | Admitting: Pharmacist

## 2023-01-04 NOTE — Telephone Encounter (Signed)
Patient called about diabetic sensors she asked if you could call her back at [phone]

## 2023-01-05 MED ORDER — TRIAMCINOLONE ACETONIDE 0.1 % EX CREA: 1.0000 | TOPICAL_CREAM | Freq: Two times a day (BID) | CUTANEOUS | 0 refills | Status: DC

## 2023-01-05 NOTE — Addendum Note (Signed)
Addended by: Nani Gasser D on: 01/05/2023 12:43 PM   Modules accepted: Orders

## 2023-01-06 NOTE — Progress Notes (Signed)
01/06/2023 Name: Tanya Hogan MRN: [id number] DOB: 07-27-1951  Chief Complaint  Patient presents with   Diabetes   Medication Assistance    Tanya Hogan is a 72 y.o. year old female who presented for a telephone visit.   They were referred to the pharmacist by their PCP for assistance in managing diabetes and medication access.    Subjective:  Care Team: Primary Care Provider: [name], MD ; Next Scheduled Visit: 10/27/22   Medication Access/Adherence  Current Pharmacy:  [facility] [phone] - [place] - [address] [facility] [address] [place] [address] Phone: [phone] Fax: [phone]  [facility]) - [place], [place] - [address] [facility] [address] [place] [address] Phone: [phone] Fax: [phone]  [facility] [id number] - [place], [place] - [address] [place] [address] Phone: [phone] Fax: [phone]  [facility] - [place], [place] - [address] [place] [address] Phone: [phone] Fax: [phone]  [facility] [address] [place] [address] Phone: [phone] Fax: [phone]   Diabetes:  Current medications: metformin 500mg  BID Medications tried in the past: jardiance (dehydration), metformin (diarrhea), ozempic (GI intolerance)  Current glucose readings: BG varies, though patient does express longstanding history of being susceptible to hypoglycemia <60 Using libre 2 meter; testing multiple times daily   Patient denies hypoglycemic s/sx including dizziness, shakiness, sweating. Patient denies hyperglycemic symptoms including polyuria, polydipsia, polyphagia, nocturia, neuropathy, blurred vision.   Current medication access support: ozempic via novonordisk, cancelling due to  no longer using medication   Objective:  Lab Results  Component Value Date   HGBA1C 6.2 (A) 10/27/2022    Lab Results  Component Value Date   CREATININE 0.99 12/21/2022   BUN 16 12/21/2022   NA 141 12/21/2022   K 5.1 12/21/2022   CL 100 12/21/2022   CO2 24 12/21/2022    Lab Results  Component Value Date   CHOL 168 04/16/2021   HDL 73 04/16/2021   LDLCALC 74 04/16/2021   TRIG 120 04/16/2021   CHOLHDL 2.3 04/16/2021    Medications Reviewed Today     Reviewed by [name], MD (Physician) on 12/28/22 at 1602  Med List Status: <None>   Medication Order Taking? Sig Documenting Provider Last Dose Status Informant  albuterol (PROVENTIL) (2.5 MG/3ML) 0.083% nebulizer solution [id number]  Take 2.5 mg by nebulization every 6 (six) hours as needed for wheezing or shortness of breath. [provider]  Active Self, Pharmacy Records  albuterol (VENTOLIN HFA) 108 (90 Base) MCG/ACT inhaler [id number]  Inhale 1-2 puffs into the lungs every 6 (six) hours as needed for wheezing or shortness of breath. [name], MD  Active Self, Pharmacy Records  alendronate (FOSAMAX) 70 MG tablet [id number]  TAKE 1 TABLET BY MOUTH  EVERY 7 DAYS WITH A FULL  GLASS OF WATER ON AN EMPTY  STOMACH [name], MD  Active Self, Pharmacy Records  AMBULATORY Clent Demark MEDICATION [id number]  Medication Name: Tubing and supplies for nebulizer. Fax - [phone] [name], MD  Active Self, Pharmacy Records  amiodarone (PACERONE) 200 MG tablet [id number]  TAKE  200 MG ONCE A DAY MONDAY THRU THURSDAY  TAKE  200 MG TWICE A DAY FRIDAY THROUGH SUNDAY [place],  Ed Blalock, PA-C  Active Self, Pharmacy Records  aspirin 81 MG chewable tablet [id number]  Chew 81 mg by mouth daily. [provider]  Active Self, Pharmacy Records  bumetanide (BUMEX) 1 MG tablet [id number]  Take 1 tablet (1 mg total) by mouth 2 (two) times daily. Take an extra tablet in the morning, if weight  gain 2 to 3 lbs in 24 hrs or 5 lbs in 7 days. Arrien, York Ram, MD  Active    Patient not taking:   Discontinued 12/28/22 1439 (Patient Preference) Continuous Blood Gluc Receiver (FREESTYLE LIBRE 2 READER) DEVI [id number]  6 (six) sensors/90 day supply. 4 refills per year. DX:E11.8 [name], MD  Active Self, Pharmacy Records    Discontinued 12/28/22 1439 (Discontinued by provider)   Discontinued 12/28/22 1559     Discontinued 12/28/22 1559 fluticasone-salmeterol (ADVAIR HFA) 115-21 MCG/ACT inhaler [id number]  Inhale 2 puffs into the lungs 2 (two) times daily. [provider]  Active Self, Pharmacy Records  glucose blood test strip [id number]  To be used to check blood glucose Dx:E11.8 FreeStyle Precision blood glucose for Freestyle Libre 2 [name], MD  Active Self, Pharmacy Records  levothyroxine (SYNTHROID) 88 MCG tablet [id number]  TAKE 1 TABLET(88 MCG) BY MOUTH DAILY [name], MD  Active Self, Pharmacy Records  MAGNESIUM PO [id number]  Take 1 tablet by mouth daily. [provider]  Active Self, Pharmacy Records  metFORMIN (GLUCOPHAGE-XR) 500 MG 24 hr tablet [id number]  Take 1 tablet (500 mg total) by mouth 2 (two) times daily with a meal. [name], MD  Active   methylPREDNISolone (MEDROL DOSEPAK) 4 MG TBPK tablet [id number]  Take as directed on package Trifan, Kermit Balo, MD  Active   MOTEGRITY 2 MG TABS [id number]  Take by mouth. [provider]  Active   Multiple Vitamins-Minerals (OCUVITE EYE + MULTI) TABS [id number]  Take 1 tablet by mouth in the morning. [provider]  Active Self, Pharmacy Records  mupirocin cream (BACTROBAN) 2 % [id number]  Apply 1 Application topically 2 (two) times daily. Achille Rich, PA-C  Active   Omega-3 Fatty Acids (FISH OIL) 1000 MG CAPS [id number]  Take 1,000 mg by mouth daily. [provider]  Active Self, Pharmacy Records  pantoprazole (PROTONIX) 40 MG tablet [id number]   TAKE 1 TABLET BY MOUTH DAILY  BEFORE BREAKFAST [name], MD  Active Self, Pharmacy Records  pravastatin (PRAVACHOL) 40 MG tablet [id number]  Take 1 tablet (40 mg total) by mouth every evening. Laurann Montana, PA-C  Active Self, Pharmacy Records  senna-docusate (SENOKOT-S) 8.6-50 MG tablet [id number]  Take 1 tablet by mouth daily. [provider]  Active Self, Pharmacy Records           Med Note Mable Fill Dec 14, 2022  3:17 PM)    Tiotropium Bromide Monohydrate (SPIRIVA RESPIMAT) 1.25 MCG/ACT AERS [id number]  Inhale 2 puffs into the lungs daily. [provider]  Active Self, Pharmacy Records    Discontinued 12/28/22 1600   valACYclovir (VALTREX) 1000 MG tablet [id number]  Take 1 tablet (1,000 mg total) by mouth 3 (three) times daily for 7 days. Terald Sleeper, MD  Active   VITAMIN D PO [id number]  Take 1 tablet by mouth daily. [provider]  Active Self, Pharmacy Records              Assessment/Plan:   Diabetes: - Currently controlled, but trying to gain access for CGM  sensors. Medicare requires either daily insulin use or hypoglycemia. Will submit DME application for CGM on the basis of safety, patient experiences hypoglycemia <60 - Recommend to continue current regimen - Recommend to check glucose as currently doing  Follow Up Plan: 2-3 weeks if updates in DME order, or as needed   Lynnda Shields, PharmD Clinical Pharmacist [facility] [phone]

## 2023-01-06 NOTE — Patient Instructions (Signed)
Darel Hong,  I submitted the order for your glucose sensors to the medical supply company. Keep an eye out for a phone call, usually they will call you to confirm the order and the delivery address. It is also set up that you might get text messages for updates about the order.  If you have questions or any issues with the order for the sensors or medication, you can call my direct line at [phone].  If I have any updates or hear anything before my maternity leave, I will give you a call!  Take care, Elmarie Shiley, PharmD, BCPS Clinical Pharmacist [facility]

## 2023-01-08 ENCOUNTER — Telehealth: Payer: Self-pay

## 2023-01-08 DIAGNOSIS — M818 Other osteoporosis without current pathological fracture: Secondary | ICD-10-CM | POA: Diagnosis not present

## 2023-01-08 DIAGNOSIS — D0511 Intraductal carcinoma in situ of right breast: Secondary | ICD-10-CM | POA: Diagnosis not present

## 2023-01-08 DIAGNOSIS — M810 Age-related osteoporosis without current pathological fracture: Secondary | ICD-10-CM | POA: Diagnosis not present

## 2023-01-08 DIAGNOSIS — Z780 Asymptomatic menopausal state: Secondary | ICD-10-CM | POA: Diagnosis not present

## 2023-01-08 DIAGNOSIS — D649 Anemia, unspecified: Secondary | ICD-10-CM | POA: Diagnosis not present

## 2023-01-08 DIAGNOSIS — R92322 Mammographic fibroglandular density, left breast: Secondary | ICD-10-CM | POA: Diagnosis not present

## 2023-01-08 DIAGNOSIS — Z1231 Encounter for screening mammogram for malignant neoplasm of breast: Secondary | ICD-10-CM | POA: Diagnosis not present

## 2023-01-08 LAB — HM DEXA SCAN

## 2023-01-08 NOTE — Telephone Encounter (Signed)
Pt has gotten a mammogram today and did not tell them that she has a ppm. Pt states that her ppm pinched her and their on site cardiologist came to look at it and said it looks fine. I have let her know to send a transmission when she gets home so that a nurse can review it. I have let her know that we will call her when we receive it

## 2023-01-08 NOTE — Telephone Encounter (Signed)
Patient has Lasix on hand at home if needed.

## 2023-01-08 NOTE — Telephone Encounter (Signed)
Following alert received from [employer] received for abnormal heart failure diagnosistic on heart failure monitoring.    Patient reports of taking prednisone x1 week, states she normally gains weight when taking, but weight has not come off (weight gain of 3 lbs per pt). Denies any shortness of breath or swelling in extremities. Reports of increased swelling in abdomen. Advised pt I will forward to Dr. Ladona Ridgel for review. Pt voiced understanding and appreciative of call.

## 2023-01-10 NOTE — Telephone Encounter (Signed)
Call her in a script for lasix 20 mg daily and take for 3 days then as needed for abdominal swelling.

## 2023-01-11 NOTE — Telephone Encounter (Signed)
Cancel the lasix. Use bumex for fluid overload.

## 2023-01-11 NOTE — Telephone Encounter (Signed)
Patient is allergic to Lasix. States she currently takes Bumetanide 1 mg BID and has extra at home. Advised I will forward to Dr. Ladona Ridgel for f/u.

## 2023-01-12 LAB — HM MAMMOGRAPHY

## 2023-01-12 NOTE — Telephone Encounter (Signed)
Patient called to advise to take Bumex as prescribed (sates she takes extra for a few days if needed already prescribed) and we will recheck Optivol in 1 week. Pt. Voiced understanding.

## 2023-01-14 ENCOUNTER — Telehealth: Payer: 59

## 2023-01-14 ENCOUNTER — Ambulatory Visit (INDEPENDENT_AMBULATORY_CARE_PROVIDER_SITE_OTHER): Payer: 59

## 2023-01-14 DIAGNOSIS — I5022 Chronic systolic (congestive) heart failure: Secondary | ICD-10-CM

## 2023-01-14 DIAGNOSIS — E1169 Type 2 diabetes mellitus with other specified complication: Secondary | ICD-10-CM

## 2023-01-14 NOTE — Chronic Care Management (AMB) (Signed)
Chronic Care Management   CCM RN Visit Note  01/14/2023 Name: Tanya Hogan MRN: [id number] DOB: February 04, 1951  Subjective: Tanya Hogan is a 72 y.o. year old female who is a primary care patient of [name], Barbarann Ehlers, MD. The patient was referred to the Chronic Care Management team for assistance with care management needs subsequent to provider initiation of CCM services and plan of care.    Today's Visit:  Engaged with patient by telephone for follow up visit.        Goals Addressed             This Visit's Progress    CCM Expected Outcome:  Monitor, Self-Manage and Reduce Symptoms of Diabetes       Current Barriers:  Knowledge Deficits related to constipation and having to change medications to see if constipation is resolved and blood sugar elevations after medication changes Chronic Disease Management support and education needs related to effective management of DM Lab Results  Component Value Date   HGBA1C 6.2 (A) 10/27/2022     Planned Interventions: Provided education to patient about basic DM disease process. The patient states her blood sugars have been bouncing around since she has not been taking her Ozempic. She had started taking the Ozempic again but has stopped. She is back taking Metformin 500 mg BID. She states that she is monitoring and her blood sugars are going up more after eating but then coming back down. She is more stable with the Ozempic but it causes her to have more gut issues. Reviewed medications with patient and discussed importance of medication adherence. The patient states she has stopped taking Ozempic and her blood sugars are staying stable. Is taking Metformin 500 mg BID;        Reviewed prescribed diet with patient heart healthy/ADA. Review and education provided. Education and support given ; Counseled on importance of regular laboratory monitoring as prescribed. Has regular labwork. States that she hopes her next A1C will not be too  elevated. Feels it will go up some because of not taking the Ozempic.         Discussed plans with patient for ongoing care management follow up and provided patient with direct contact information for care management team;      Provided patient with written educational materials related to hypo and hyperglycemia and importance of correct treatment. Denies any issues with hypo or hyperglycemia. States the highest she has seen is around 324 and the lowest around 100.;       Reviewed scheduled/upcoming provider appointments including: 02-16-2023;         Advised patient, providing education and rationale, to check cbg twice daily and when you have symptoms of low or high blood sugar and record. Currently the patient has a continuous reader. She states that it has been 100 to 324. She states fasting it is 120 to 130.  7 day average has been around 142 when saw the doctor last. Usually after eating it goes up to 200's. Review of goal of fasting is <130 and post prandial of <180.   She did ask for assistance on finding out about the freestyle libre from 2 to 3. She had called the office and ask about it and they are working on it. Education and support given. Will continue to monitor for changes.    Referral made to pharmacy team for assistance with PAP for DM medications. The patient is working with the pharm D for medication management and  support Review of patient status, including review of consultants reports, relevant laboratory and other test results, and medications completed;       Advised patient to discuss changes in DM, questions, or concerns with provider;      Screening for signs and symptoms of depression related to chronic disease state;        Assessed social determinant of health barriers;  The patient has made positive changes in her habits and lifestyle. She feels like she is not as stressed. She says going to the senior center is helping her with her socialization and having a better  relationship with her family as well. 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 Talked about self care.  Encouraged the patient to continue doing things she enjoys.    Symptom Management: Take medications as prescribed   Attend all scheduled provider appointments Call provider office for new concerns or questions  call the Suicide and Crisis Lifeline: 988 call the [place] National Suicide Prevention Lifeline: (445) 850-3215 or TTY: (717)859-0837 TTY (316)061-4754) to talk to a trained counselor call 1-800-273-TALK (toll free, 24 hour hotline) if experiencing a Mental Health or Behavioral Health Crisis  check feet daily for cuts, sores or redness enter blood sugar readings and medication or insulin into daily log take the blood sugar log to all doctor visits trim toenails straight across manage portion size keep feet up while sitting wash and dry feet carefully every day wear comfortable, cotton socks wear comfortable, well-fitting shoes  Follow Up Plan: Telephone follow up appointment with care management team member scheduled for: 03-04-2023 at 345 pm       CCM Expected Outcome:  Monitor, Self-Manage and Reduce Symptoms of Heart Failure       Current Barriers:  Knowledge Deficits related to the biventricular pacing system and the generator changes and fully understanding the pacing system and how it works Chronic Disease Management support and education needs related to effective management of HF EF% 20-25 % in May of 2023, 08-29-2022 EF 25 to 30%  Pacemaker generator setting changed in May of 2023. During hospitalization they discussed her upgrading her pacemaker to an ICD- the patient is unsure if she wants to do this Post discharge from the hospital for SVT 08-28-2022 to 09-03-2022, post discharge from the hospital 10-29-2022 to 11-01-2022  Wt Readings from Last 3 Encounters:  12/28/22 222 lb (100.7 kg)  12/24/22 221  lb 9 oz (100.5 kg)  12/14/22 221 lb 9.6 oz (100.5 kg)  Self reported on 01-14-2023 is 220   Planned Interventions: Basic overview and discussion of pathophysiology of Heart Failure reviewed. Patient states her dry weight is 217 and this is good for her. She is weighing daily and her weight today was 220. The patient is taking Bumex 1.5 tablets in the am, 1 tablet in pm. She is on day 3 of taking extra fluid pill according to the HF clinic. She states that she does not feel bad but her weight keeps going up and she doesn't understand why. Education on calling the office tomorrow due to upcoming holiday weekend if her weight does not start trending down. Education and support given.   Provided education on low sodium diet/ADA. Education and review. The patient is compliant with dietary restrictions. She has had issues in the past with hyperkalemia and she is mindful of this. Education on monitoring sodium content of foods closely. She  states sometimes she does backslide and sometimes she does not know the content of sodium in foods, but she is monitoring closer.  Reviewed Heart Failure Action Plan in depth and provided written copy.Review with the patient the plan of care for changes in her weight. She has got her fluid pretty much balanced and also her bowels are moving more effectively now. Assessed need for readable accurate scales in home. She is weighing daily and she is monitoring closely for changes. She is in close contact with the HF clinic and has the number to call them.   Provided education about placing scale on hard, flat surface Advised patient to weigh each morning after emptying bladder Discussed importance of daily weight and advised patient to weigh and record daily. The patient is compliant with weighing at this time.  She has to sleep with 3 pillows and she easily gets short of breath but states her oxygen stays up. She has a new sims card for her bipap machine and is using it now at  night.  Reviewed role of diuretics in prevention of fluid overload and management of heart failure. The patient has parameters and is taking her Bumex as instructed.The patient is in much better spirits today and feels a lot better.  Discussed the importance of keeping all appointments with provider. 02-16-2023 with the pcp, reminder given today. Sees cardiologist regularly.  Provided patient with education about the role of exercise in the management of heart failure. The patient is going to the senior center 3 days a week and doing exercises there. She likes this better than what she was doing at the [facility]. She works out about 45 minutes each time she goes. She is also doing mahjong, a game she had been wanting to learn to play. She says the socialization and activity have been a positive thing for her. She is optimistic and taking it "one day at a time". The patient wants to do what she can to take care of herself and her chronic conditions. She is thankful for new things she is doing and the support of her family as well. Advised patient to discuss changes in HF or other  with provider Screening for signs and symptoms of depression related to chronic disease state  Assessed social determinant of health barriers  Symptom Management: Take medications as prescribed   Attend all scheduled provider appointments Call provider office for new concerns or questions  call the Suicide and Crisis Lifeline: 988 call the [place] National Suicide Prevention Lifeline: 332-062-3083 or TTY: 401 755 5251 TTY 3083534180) to talk to a trained counselor call 1-800-273-TALK (toll free, 24 hour hotline) if experiencing a Mental Health or Behavioral Health Crisis  call office if I gain more than 2 pounds in one day or 5 pounds in one week keep legs up while sitting track weight in diary use salt in moderation watch for swelling in feet, ankles and legs every day weigh myself daily begin a heart failure diary bring  diary to all appointments develop a rescue plan follow rescue plan if symptoms flare-up track symptoms and what helps feel better or worse dress right for the weather, hot or cold  Follow Up Plan: Telephone follow up appointment with care management team member scheduled for: 03-04-2023 at 345 pm          Plan:Telephone follow up appointment with care management team member scheduled for:  03-04-2023 at 345 pm  Alto Denver RN, MSN, CCM RN Care Manager  Chronic Care Management  Direct Number: [phone]

## 2023-01-14 NOTE — Patient Instructions (Signed)
Please call the care guide team at [phone] if you need to cancel or reschedule your appointment.   If you are experiencing a Mental Health or Behavioral Health Crisis or need someone to talk to, please call the Suicide and Crisis Lifeline: 988 call the [place] National Suicide Prevention Lifeline: 908-364-5806 or TTY: 6294389360 TTY 870-791-2883) to talk to a trained counselor call 1-800-273-TALK (toll free, 24 hour hotline) go to [facility] [address] [place] [phone])   Following is a copy of the CCM Program Consent:  CCM service includes personalized support from designated clinical staff supervised by the physician, including individualized plan of care and coordination with other care providers 24/7 contact phone numbers for assistance for urgent and routine care needs. Service will only be billed when office clinical staff spend 20 minutes or more in a month to coordinate care. Only one practitioner may furnish and bill the service in a calendar month. The patient may stop CCM services at amy time (effective at the end of the month) by phone call to the office staff. The patient will be responsible for cost sharing (co-pay) or up to 20% of the service fee (after annual deductible is met)  Following is a copy of your full provider care plan:   Goals Addressed             This Visit's Progress    CCM Expected Outcome:  Monitor, Self-Manage and Reduce Symptoms of Diabetes       Current Barriers:  Knowledge Deficits related to constipation and having to change medications to see if constipation is resolved and blood sugar elevations after medication changes Chronic Disease Management support and education needs related to effective management of DM Lab Results  Component Value Date   HGBA1C 6.2 (A) 10/27/2022     Planned Interventions: Provided education to patient about basic DM disease process. The patient states her blood  sugars have been bouncing around since she has not been taking her Ozempic. She had started taking the Ozempic again but has stopped. She is back taking Metformin 500 mg BID. She states that she is monitoring and her blood sugars are going up more after eating but then coming back down. She is more stable with the Ozempic but it causes her to have more gut issues. Reviewed medications with patient and discussed importance of medication adherence. The patient states she has stopped taking Ozempic and her blood sugars are staying stable. Is taking Metformin 500 mg BID;        Reviewed prescribed diet with patient heart healthy/ADA. Review and education provided. Education and support given ; Counseled on importance of regular laboratory monitoring as prescribed. Has regular labwork. States that she hopes her next A1C will not be too elevated. Feels it will go up some because of not taking the Ozempic.         Discussed plans with patient for ongoing care management follow up and provided patient with direct contact information for care management team;      Provided patient with written educational materials related to hypo and hyperglycemia and importance of correct treatment. Denies any issues with hypo or hyperglycemia. States the highest she has seen is around 324 and the lowest around 100.;       Reviewed scheduled/upcoming provider appointments including: 02-16-2023;         Advised patient, providing education and rationale, to check cbg twice daily and when you have symptoms of low or high  blood sugar and record. Currently the patient has a continuous reader. She states that it has been 100 to 324. She states fasting it is 120 to 130.  7 day average has been around 142 when saw the doctor last. Usually after eating it goes up to 200's. Review of goal of fasting is <130 and post prandial of <180.   She did ask for assistance on finding out about the freestyle libre from 2 to 3. She had called the office  and ask about it and they are working on it. Education and support given. Will continue to monitor for changes.    Referral made to pharmacy team for assistance with PAP for DM medications. The patient is working with the pharm D for medication management and support Review of patient status, including review of consultants reports, relevant laboratory and other test results, and medications completed;       Advised patient to discuss changes in DM, questions, or concerns with provider;      Screening for signs and symptoms of depression related to chronic disease state;        Assessed social determinant of health barriers;  The patient has made positive changes in her habits and lifestyle. She feels like she is not as stressed. She says going to the senior center is helping her with her socialization and having a better relationship with her family as well. 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 Talked about self care.  Encouraged the patient to continue doing things she enjoys.    Symptom Management: Take medications as prescribed   Attend all scheduled provider appointments Call provider office for new concerns or questions  call the Suicide and Crisis Lifeline: 988 call the [place] National Suicide Prevention Lifeline: (843)609-8708 or TTY: 267-273-2160 TTY 817 293 8458) to talk to a trained counselor call 1-800-273-TALK (toll free, 24 hour hotline) if experiencing a Mental Health or Behavioral Health Crisis  check feet daily for cuts, sores or redness enter blood sugar readings and medication or insulin into daily log take the blood sugar log to all doctor visits trim toenails straight across manage portion size keep feet up while sitting wash and dry feet carefully every day wear comfortable, cotton socks wear comfortable, well-fitting shoes  Follow Up Plan: Telephone follow up appointment with care  management team member scheduled for: 03-04-2023 at 345 pm       CCM Expected Outcome:  Monitor, Self-Manage and Reduce Symptoms of Heart Failure       Current Barriers:  Knowledge Deficits related to the biventricular pacing system and the generator changes and fully understanding the pacing system and how it works Chronic Disease Management support and education needs related to effective management of HF EF% 20-25 % in May of 2023, 08-29-2022 EF 25 to 30%  Pacemaker generator setting changed in May of 2023. During hospitalization they discussed her upgrading her pacemaker to an ICD- the patient is unsure if she wants to do this Post discharge from the hospital for SVT 08-28-2022 to 09-03-2022, post discharge from the hospital 10-29-2022 to 11-01-2022  Wt Readings from Last 3 Encounters:  12/28/22 222 lb (100.7 kg)  12/24/22 221 lb 9 oz (100.5 kg)  12/14/22 221 lb 9.6 oz (100.5 kg)  Self reported on 01-14-2023 is 220   Planned Interventions: Basic overview and discussion of pathophysiology of Heart Failure reviewed. Patient states her dry weight  is 217 and this is good for her. She is weighing daily and her weight today was 220. The patient is taking Bumex 1.5 tablets in the am, 1 tablet in pm. She is on day 3 of taking extra fluid pill according to the HF clinic. She states that she does not feel bad but her weight keeps going up and she doesn't understand why. Education on calling the office tomorrow due to upcoming holiday weekend if her weight does not start trending down. Education and support given.   Provided education on low sodium diet/ADA. Education and review. The patient is compliant with dietary restrictions. She has had issues in the past with hyperkalemia and she is mindful of this. Education on monitoring sodium content of foods closely. She states sometimes she does backslide and sometimes she does not know the content of sodium in foods, but she is monitoring closer.  Reviewed Heart  Failure Action Plan in depth and provided written copy.Review with the patient the plan of care for changes in her weight. She has got her fluid pretty much balanced and also her bowels are moving more effectively now. Assessed need for readable accurate scales in home. She is weighing daily and she is monitoring closely for changes. She is in close contact with the HF clinic and has the number to call them.   Provided education about placing scale on hard, flat surface Advised patient to weigh each morning after emptying bladder Discussed importance of daily weight and advised patient to weigh and record daily. The patient is compliant with weighing at this time.  She has to sleep with 3 pillows and she easily gets short of breath but states her oxygen stays up. She has a new sims card for her bipap machine and is using it now at night.  Reviewed role of diuretics in prevention of fluid overload and management of heart failure. The patient has parameters and is taking her Bumex as instructed.The patient is in much better spirits today and feels a lot better.  Discussed the importance of keeping all appointments with provider. 02-16-2023 with the pcp, reminder given today. Sees cardiologist regularly.  Provided patient with education about the role of exercise in the management of heart failure. The patient is going to the senior center 3 days a week and doing exercises there. She likes this better than what she was doing at the [facility]. She works out about 45 minutes each time she goes. She is also doing mahjong, a game she had been wanting to learn to play. She says the socialization and activity have been a positive thing for her. She is optimistic and taking it "one day at a time". The patient wants to do what she can to take care of herself and her chronic conditions. She is thankful for new things she is doing and the support of her family as well. Advised patient to discuss changes in HF or other  with  provider Screening for signs and symptoms of depression related to chronic disease state  Assessed social determinant of health barriers  Symptom Management: Take medications as prescribed   Attend all scheduled provider appointments Call provider office for new concerns or questions  call the Suicide and Crisis Lifeline: 988 call the [place] National Suicide Prevention Lifeline: 515-272-7443 or TTY: (234)074-0991 TTY 5075689439) to talk to a trained counselor call 1-800-273-TALK (toll free, 24 hour hotline) if experiencing a Mental Health or Behavioral Health Crisis  call office if I gain more than  2 pounds in one day or 5 pounds in one week keep legs up while sitting track weight in diary use salt in moderation watch for swelling in feet, ankles and legs every day weigh myself daily begin a heart failure diary bring diary to all appointments develop a rescue plan follow rescue plan if symptoms flare-up track symptoms and what helps feel better or worse dress right for the weather, hot or cold  Follow Up Plan: Telephone follow up appointment with care management team member scheduled for: 03-04-2023 at 345 pm          Patient verbalizes understanding of instructions and care plan provided today and agrees to view in MyChart. Active MyChart status and patient understanding of how to access instructions and care plan via MyChart confirmed with patient.  Telephone follow up appointment with care management team member scheduled for: 03-04-2023 at 345 pm

## 2023-01-17 ENCOUNTER — Telehealth: Payer: Self-pay | Admitting: Internal Medicine

## 2023-01-17 NOTE — Telephone Encounter (Signed)
Tanya Hogan contacted the after hours cardiology service to discuss recent weight gain over the past week after starting prednisone.  She reports around 8 lbs weight gain over the past week but denies swelling in her legs.  She feels the swelling is mostly in her 'gut'.  Patient is currently taking Bumex 1mg  twice a day.  I informed patient weight increase may be due to prednisone.  If she gains any additional weight (e.g., more than 2 pounds) in the next 24 hours, she is to take an extra tablet of her Bumex.

## 2023-01-20 DIAGNOSIS — H43391 Other vitreous opacities, right eye: Secondary | ICD-10-CM | POA: Diagnosis not present

## 2023-01-20 DIAGNOSIS — Z961 Presence of intraocular lens: Secondary | ICD-10-CM | POA: Diagnosis not present

## 2023-01-20 DIAGNOSIS — Z794 Long term (current) use of insulin: Secondary | ICD-10-CM | POA: Diagnosis not present

## 2023-01-20 LAB — HM DIABETES EYE EXAM

## 2023-01-22 DIAGNOSIS — E785 Hyperlipidemia, unspecified: Secondary | ICD-10-CM

## 2023-01-22 DIAGNOSIS — I5020 Unspecified systolic (congestive) heart failure: Secondary | ICD-10-CM

## 2023-01-22 DIAGNOSIS — E1159 Type 2 diabetes mellitus with other circulatory complications: Secondary | ICD-10-CM | POA: Diagnosis not present

## 2023-01-22 DIAGNOSIS — Z7984 Long term (current) use of oral hypoglycemic drugs: Secondary | ICD-10-CM

## 2023-01-23 DIAGNOSIS — R0789 Other chest pain: Secondary | ICD-10-CM | POA: Diagnosis not present

## 2023-01-23 DIAGNOSIS — I5020 Unspecified systolic (congestive) heart failure: Secondary | ICD-10-CM | POA: Diagnosis not present

## 2023-01-23 DIAGNOSIS — J441 Chronic obstructive pulmonary disease with (acute) exacerbation: Secondary | ICD-10-CM | POA: Diagnosis not present

## 2023-01-23 DIAGNOSIS — M069 Rheumatoid arthritis, unspecified: Secondary | ICD-10-CM | POA: Diagnosis not present

## 2023-01-23 DIAGNOSIS — K219 Gastro-esophageal reflux disease without esophagitis: Secondary | ICD-10-CM | POA: Diagnosis not present

## 2023-01-23 DIAGNOSIS — I4891 Unspecified atrial fibrillation: Secondary | ICD-10-CM | POA: Diagnosis not present

## 2023-01-23 DIAGNOSIS — Z950 Presence of cardiac pacemaker: Secondary | ICD-10-CM | POA: Diagnosis not present

## 2023-01-23 DIAGNOSIS — G4733 Obstructive sleep apnea (adult) (pediatric): Secondary | ICD-10-CM | POA: Diagnosis not present

## 2023-01-23 DIAGNOSIS — I5023 Acute on chronic systolic (congestive) heart failure: Secondary | ICD-10-CM | POA: Diagnosis not present

## 2023-01-23 DIAGNOSIS — Z853 Personal history of malignant neoplasm of breast: Secondary | ICD-10-CM | POA: Diagnosis not present

## 2023-01-23 DIAGNOSIS — M797 Fibromyalgia: Secondary | ICD-10-CM | POA: Diagnosis not present

## 2023-01-23 DIAGNOSIS — Z794 Long term (current) use of insulin: Secondary | ICD-10-CM | POA: Diagnosis not present

## 2023-01-23 DIAGNOSIS — Z87891 Personal history of nicotine dependence: Secondary | ICD-10-CM | POA: Diagnosis not present

## 2023-01-23 DIAGNOSIS — I517 Cardiomegaly: Secondary | ICD-10-CM | POA: Diagnosis not present

## 2023-01-23 DIAGNOSIS — Z7982 Long term (current) use of aspirin: Secondary | ICD-10-CM | POA: Diagnosis not present

## 2023-01-23 DIAGNOSIS — J45909 Unspecified asthma, uncomplicated: Secondary | ICD-10-CM | POA: Diagnosis not present

## 2023-01-23 DIAGNOSIS — I5021 Acute systolic (congestive) heart failure: Secondary | ICD-10-CM | POA: Diagnosis not present

## 2023-01-23 DIAGNOSIS — R0602 Shortness of breath: Secondary | ICD-10-CM | POA: Diagnosis not present

## 2023-01-23 DIAGNOSIS — I429 Cardiomyopathy, unspecified: Secondary | ICD-10-CM | POA: Diagnosis not present

## 2023-01-23 DIAGNOSIS — I493 Ventricular premature depolarization: Secondary | ICD-10-CM | POA: Diagnosis not present

## 2023-01-23 DIAGNOSIS — E119 Type 2 diabetes mellitus without complications: Secondary | ICD-10-CM | POA: Diagnosis not present

## 2023-01-23 DIAGNOSIS — K589 Irritable bowel syndrome without diarrhea: Secondary | ICD-10-CM | POA: Diagnosis not present

## 2023-01-23 DIAGNOSIS — R079 Chest pain, unspecified: Secondary | ICD-10-CM | POA: Diagnosis not present

## 2023-01-23 DIAGNOSIS — I110 Hypertensive heart disease with heart failure: Secondary | ICD-10-CM | POA: Diagnosis not present

## 2023-01-23 DIAGNOSIS — I340 Nonrheumatic mitral (valve) insufficiency: Secondary | ICD-10-CM | POA: Diagnosis not present

## 2023-01-23 DIAGNOSIS — K7581 Nonalcoholic steatohepatitis (NASH): Secondary | ICD-10-CM | POA: Diagnosis not present

## 2023-01-23 DIAGNOSIS — I509 Heart failure, unspecified: Secondary | ICD-10-CM | POA: Diagnosis not present

## 2023-01-24 DIAGNOSIS — I429 Cardiomyopathy, unspecified: Secondary | ICD-10-CM | POA: Diagnosis not present

## 2023-01-24 DIAGNOSIS — I509 Heart failure, unspecified: Secondary | ICD-10-CM | POA: Diagnosis not present

## 2023-01-24 DIAGNOSIS — R079 Chest pain, unspecified: Secondary | ICD-10-CM | POA: Diagnosis not present

## 2023-01-25 DIAGNOSIS — R079 Chest pain, unspecified: Secondary | ICD-10-CM | POA: Diagnosis not present

## 2023-01-25 DIAGNOSIS — I5020 Unspecified systolic (congestive) heart failure: Secondary | ICD-10-CM | POA: Diagnosis not present

## 2023-01-26 DIAGNOSIS — I5021 Acute systolic (congestive) heart failure: Secondary | ICD-10-CM | POA: Diagnosis not present

## 2023-01-27 DIAGNOSIS — I340 Nonrheumatic mitral (valve) insufficiency: Secondary | ICD-10-CM | POA: Diagnosis not present

## 2023-01-27 DIAGNOSIS — I5021 Acute systolic (congestive) heart failure: Secondary | ICD-10-CM | POA: Diagnosis not present

## 2023-01-27 DIAGNOSIS — I517 Cardiomegaly: Secondary | ICD-10-CM | POA: Diagnosis not present

## 2023-01-28 DIAGNOSIS — I5021 Acute systolic (congestive) heart failure: Secondary | ICD-10-CM | POA: Diagnosis not present

## 2023-01-29 DIAGNOSIS — I429 Cardiomyopathy, unspecified: Secondary | ICD-10-CM | POA: Diagnosis not present

## 2023-01-29 DIAGNOSIS — I509 Heart failure, unspecified: Secondary | ICD-10-CM | POA: Diagnosis not present

## 2023-01-29 DIAGNOSIS — R079 Chest pain, unspecified: Secondary | ICD-10-CM | POA: Diagnosis not present

## 2023-01-31 DIAGNOSIS — Z7985 Long-term (current) use of injectable non-insulin antidiabetic drugs: Secondary | ICD-10-CM | POA: Diagnosis not present

## 2023-01-31 DIAGNOSIS — R918 Other nonspecific abnormal finding of lung field: Secondary | ICD-10-CM | POA: Diagnosis not present

## 2023-01-31 DIAGNOSIS — K219 Gastro-esophageal reflux disease without esophagitis: Secondary | ICD-10-CM | POA: Diagnosis not present

## 2023-01-31 DIAGNOSIS — I5021 Acute systolic (congestive) heart failure: Secondary | ICD-10-CM | POA: Diagnosis not present

## 2023-01-31 DIAGNOSIS — Z7951 Long term (current) use of inhaled steroids: Secondary | ICD-10-CM | POA: Diagnosis not present

## 2023-01-31 DIAGNOSIS — Z882 Allergy status to sulfonamides status: Secondary | ICD-10-CM | POA: Diagnosis not present

## 2023-01-31 DIAGNOSIS — F17290 Nicotine dependence, other tobacco product, uncomplicated: Secondary | ICD-10-CM | POA: Diagnosis not present

## 2023-01-31 DIAGNOSIS — I2510 Atherosclerotic heart disease of native coronary artery without angina pectoris: Secondary | ICD-10-CM | POA: Diagnosis not present

## 2023-02-01 ENCOUNTER — Telehealth: Payer: Self-pay

## 2023-02-01 NOTE — Transitions of Care (Post Inpatient/ED Visit) (Signed)
02/01/2023  Name: Tanya Hogan MRN: [id number] DOB: [birth year]  Today's TOC FU Call Status: Today's TOC FU Call Status:: Successful TOC FU Call Competed TOC FU Call Complete Date: 02/01/23  Transition Care Management Follow-up Telephone Call Date of Discharge: 01/29/23 Discharge Facility: Other ([facility]) Name of Other ([facility]) [facility] Facility: [facility] Type of Discharge: Inpatient Admission Primary Inpatient Discharge Diagnosis:: Acute Systolic Heart Failure How have you been since you were released from the hospital?: Better Any questions or concerns?: No  Items Reviewed: Did you receive and understand the discharge instructions provided?: Yes Medications obtained,verified, and reconciled?: Yes (Medications Reviewed) Any new allergies since your discharge?: No Dietary orders reviewed?: Yes Type of Diet Ordered:: Low salt, heart healthy Do you have support at home?: Yes People in Home: child(ren), adult, sibling(s) Name of Support/Comfort Primary Source: Brother and daughter  Medications Reviewed Today: Medications Reviewed Today     Reviewed by Jodelle Gross, RN (Case Manager) on 02/01/23 at 1242  Med List Status: <None>   Medication Order Taking? Sig Documenting Provider Last Dose Status Informant  albuterol (PROVENTIL) (2.5 MG/3ML) 0.083% nebulizer solution [id number] Yes Take 2.5 mg by nebulization every 6 (six) hours as needed for wheezing or shortness of breath. [provider] Taking Active Self, Pharmacy Records  albuterol (VENTOLIN HFA) 108 (90 Base) MCG/ACT inhaler [id number] Yes Inhale 1-2 puffs into the lungs every 6 (six) hours as needed for wheezing or shortness of breath. [name], MD Taking Active Self, Pharmacy Records  alendronate (FOSAMAX) 70 MG tablet [id number] Yes TAKE 1 TABLET BY MOUTH  EVERY 7 DAYS WITH A FULL  GLASS OF WATER ON AN EMPTY  STOMACH [name], MD Taking Active Self, Pharmacy Records   AMBULATORY Clent Demark MEDICATION [id number] No Medication Name: Tubing and supplies for nebulizer. Fax - [phone] [name], MD Unknown Active Self, Pharmacy Records  amiodarone (PACERONE) 200 MG tablet [id number] Yes TAKE  200 MG ONCE A DAY MONDAY THRU THURSDAY  TAKE  200 MG TWICE A DAY FRIDAY THROUGH SUNDAY Sheilah Pigeon, [place] Taking Active Self, Pharmacy Records  aspirin 81 MG chewable tablet [id number] Yes Chew 81 mg by mouth daily. [provider] Taking Active Self, Pharmacy Records  bumetanide (BUMEX) 1 MG tablet [id number] Yes Take 1 tablet (1 mg total) by mouth 2 (two) times daily. Take an extra tablet in the morning, if weight gain 2 to 3 lbs in 24 hrs or 5 lbs in 7 days. Arrien, York Ram, MD Taking Active   Continuous Blood Gluc Receiver (FREESTYLE LIBRE 2 READER) DEVI [id number] Yes 6 (six) sensors/90 day supply. 4 refills per year. DX:E11.8 [name], MD Taking Active Self, Pharmacy Records  fluticasone-salmeterol [facility]) 160-10 MCG/ACT inhaler [id number] Yes Inhale 2 puffs into the lungs 2 (two) times daily. [provider] Taking Active Self, Pharmacy Records  glucose blood test strip [id number] Yes To be used to check blood glucose Dx:E11.8 FreeStyle Precision blood glucose for Freestyle Libre 2 [name], MD Taking Active Self, Pharmacy Records  levothyroxine (SYNTHROID) 88 MCG tablet [id number] Yes TAKE 1 TABLET(88 MCG) BY MOUTH DAILY [name], MD Taking Active Self, Pharmacy Records  MAGNESIUM PO [id number] Yes Take 1 tablet by mouth daily. [provider] Taking Active Self, Pharmacy Records  metFORMIN (GLUCOPHAGE-XR) 500 MG 24 hr tablet [id number] Yes Take 1 tablet (500 mg total) by mouth 2 (two) times daily with a meal. [name], MD Taking Active  MOTEGRITY 2 MG TABS [id number] Yes Take by mouth. [provider] Taking Active   Multiple Vitamins-Minerals (OCUVITE EYE +  MULTI) TABS [id number] Yes Take 1 tablet by mouth in the morning. [provider] Taking Active Self, Pharmacy Records  mupirocin cream (BACTROBAN) 2 % [id number] No Apply 1 Application topically 2 (two) times daily. Achille Rich, PA-C Unknown Active   Omega-3 Fatty Acids (FISH OIL) 1000 MG CAPS [id number] No Take 1,000 mg by mouth daily. [provider] Unknown Active Self, Pharmacy Records  pantoprazole (PROTONIX) 40 MG tablet [id number] Yes TAKE 1 TABLET BY MOUTH DAILY  BEFORE BREAKFAST [name], MD Taking Active Self, Pharmacy Records  pravastatin (PRAVACHOL) 40 MG tablet [id number] Yes Take 1 tablet (40 mg total) by mouth every evening. Laurann Montana, PA-C Taking Active Self, Pharmacy Records  senna-docusate (SENOKOT-S) 8.6-50 MG tablet [id number] No Take 1 tablet by mouth daily. [provider] Unknown Active Self, Pharmacy Records           Med Note Mable Fill Dec 14, 2022  3:17 PM)    Tiotropium Bromide Monohydrate (SPIRIVA RESPIMAT) 1.25 MCG/ACT AERS [id number] Yes Inhale 2 puffs into the lungs daily. [provider] Taking Active Self, Pharmacy Records  triamcinolone cream (KENALOG) 0.1 % [id number] No Apply 1 Application topically 2 (two) times daily. [name], MD Unknown Active   VITAMIN D PO [id number] Yes Take 1 tablet by mouth daily. [provider] Taking Active Self, Pharmacy Records            Home Care and Equipment/Supplies: Were Home Health Services Ordered?: Yes Name of Home Health Agency:: [facility] Has Agency set up a time to come to your home?: Yes First Home Health Visit Date: 01/31/23 Any new equipment or medical supplies ordered?: No  Functional Questionnaire: Do you need assistance with bathing/showering or dressing?: No Do you need assistance with meal preparation?: No Do you need assistance with eating?: No Do you have difficulty maintaining continence: No Do you need  assistance with getting out of bed/getting out of a chair/moving?: No Do you have difficulty managing or taking your medications?: No  Follow up appointments reviewed: PCP Follow-up appointment confirmed?: Yes Date of PCP follow-up appointment?: 02/16/23 Follow-up Provider: Dr. Linford Arnold Specialist [facility] Follow-up appointment confirmed?: Yes Date of Specialist follow-up appointment?: 02/09/23 Follow-Up Specialty Provider:: Micah Flesher NP Do you need transportation to your follow-up appointment?: No Do you understand care options if your condition(s) worsen?: Yes-patient verbalized understanding  SDOH Interventions Today    Flowsheet Row Most Recent Value  SDOH Interventions   Food Insecurity Interventions Intervention Not Indicated  Housing Interventions Intervention Not Indicated  Transportation Interventions Intervention Not Indicated      Jodelle Gross, RN, BSN, CCM Care Management Coordinator [facility] Phone: [phone]/Fax: [phone]

## 2023-02-02 ENCOUNTER — Ambulatory Visit (INDEPENDENT_AMBULATORY_CARE_PROVIDER_SITE_OTHER): Payer: 59

## 2023-02-02 DIAGNOSIS — I428 Other cardiomyopathies: Secondary | ICD-10-CM

## 2023-02-03 DIAGNOSIS — I5021 Acute systolic (congestive) heart failure: Secondary | ICD-10-CM | POA: Diagnosis not present

## 2023-02-03 DIAGNOSIS — I110 Hypertensive heart disease with heart failure: Secondary | ICD-10-CM | POA: Diagnosis not present

## 2023-02-03 DIAGNOSIS — E1159 Type 2 diabetes mellitus with other circulatory complications: Secondary | ICD-10-CM | POA: Diagnosis not present

## 2023-02-03 LAB — CUP PACEART REMOTE DEVICE CHECK
Battery Remaining Longevity: 91 mo
Battery Voltage: 3.02 V
Brady Statistic AP VP Percent: 25.83 %
Brady Statistic AP VS Percent: 0.11 %
Brady Statistic AS VP Percent: 71.66 %
Brady Statistic AS VS Percent: 2.4 %
Brady Statistic RA Percent Paced: 26.66 %
Brady Statistic RV Percent Paced: 97.49 %
Date Time Interrogation Session: 20240611021750
Implantable Lead Connection Status: 753985
Implantable Lead Connection Status: 753985
Implantable Lead Connection Status: 753985
Implantable Lead Implant Date: 20030324
Implantable Lead Implant Date: 20030324
Implantable Lead Implant Date: 20230313
Implantable Lead Location: 753858
Implantable Lead Location: 753859
Implantable Lead Location: 753860
Implantable Lead Model: 3830
Implantable Lead Model: 4068
Implantable Lead Model: 4193
Implantable Pulse Generator Implant Date: 20230313
Lead Channel Impedance Value: 1064 Ohm
Lead Channel Impedance Value: 3306 Ohm
Lead Channel Impedance Value: 3306 Ohm
Lead Channel Impedance Value: 3306 Ohm
Lead Channel Impedance Value: 418 Ohm
Lead Channel Impedance Value: 437 Ohm
Lead Channel Impedance Value: 513 Ohm
Lead Channel Impedance Value: 551 Ohm
Lead Channel Impedance Value: 950 Ohm
Lead Channel Pacing Threshold Amplitude: 0.875 V
Lead Channel Pacing Threshold Amplitude: 2.125 V
Lead Channel Pacing Threshold Pulse Width: 0.4 ms
Lead Channel Pacing Threshold Pulse Width: 0.8 ms
Lead Channel Sensing Intrinsic Amplitude: 14.25 mV
Lead Channel Sensing Intrinsic Amplitude: 14.25 mV
Lead Channel Sensing Intrinsic Amplitude: 2.375 mV
Lead Channel Sensing Intrinsic Amplitude: 2.375 mV
Lead Channel Setting Pacing Amplitude: 2 V
Lead Channel Setting Pacing Amplitude: 2.5 V
Lead Channel Setting Pacing Amplitude: 2.5 V
Lead Channel Setting Pacing Pulse Width: 0.4 ms
Lead Channel Setting Pacing Pulse Width: 0.8 ms
Lead Channel Setting Sensing Sensitivity: 2.8 mV
Zone Setting Status: 755011
Zone Setting Status: 755011

## 2023-02-05 ENCOUNTER — Telehealth: Payer: Self-pay

## 2023-02-05 NOTE — Progress Notes (Signed)
   02/05/2023  Patient ID: Tanya Hogan, female   DOB: 21-Jan-1951, 72 y.o.   MRN: [id number]  Received notification from PCP office that patient called to check on the status of her Tanya Hogan 3 supplies.  [name] submitted an order in [place] for DME supplier to fill these for the patient the middle of May.  Contacting [employer] for an update

## 2023-02-05 NOTE — Telephone Encounter (Signed)
Patient called wanting to know if you have received verification for her Freestyle 3.

## 2023-02-07 NOTE — Progress Notes (Unsigned)
Cardiology Office Note:    Date:  02/09/2023   ID:  Tanya Hogan, DOB [birth year], MRN [id number]  PCP:  [name], MD   [facility] Providers Cardiologist:  Meriam Sprague, MD Electrophysiologist:  Lewayne Bunting, MD {  Referring MD: [name], *   Chief Complaint  Patient presents with   Follow-up    CHF    History of Present Illness:    Tanya Hogan is a 72 y.o. female with a hx of hypertension, hyperlipidemia, DM 2, VT, nonischemic cardiomyopathy, chronic systolic heart failure, OSA on CPAP, CHB s/p CRT-P, nonalcoholic cirrhosis, and history of breast cancer status post mastectomy.  She has a longstanding history of nonischemic cardiomyopathy with device placement in 2004.  She had an upgrade for CRT-D in 2013.  Ischemic evaluation 01/2020 with normal coronary arteries and EF 35-40%.  She was admitted 08/2021 at [facility] for CHF exacerbation and echo with an EF 20 percent.  She was admitted January 2024 with VT and converted to paced rhythm on IV amiodarone.  Echo at that time with an LVEF 25-30%. She was referred to EP and was continued on amiodarone.  She established care 12/14/22 with the advanced heart failure clinic and was seen by Dr. Gasper Hogan.  She has not been able to tolerate vasodilators or ARBs (itching).  Carvedilol was discontinued and she was started on digoxin once to 0.125 mg daily.  At baseline she takes Bumex 1 mg BID.  Given her functional capacity limited by lower extremity arthritis and notable social support, she was not considered a candidate for advanced therapies including LVAD or home inotropes.  Focus on symptom control was discussed. She is felt to be end stage heart failure.   She developed a rash that may have been due to digoxin and ha since discontinued this. She reports she had new dryer sheets and possibly shingles, as she had unilateral blisters.  She was placed on PO prednisone.   She was admitted to [facility] with a CHF exacerbation 01/2023.  She was diuresed with IV Bumex 2 mg twice daily and was discharged on 2 mg bumex BID.  Dry weight felt to be 214 pounds.  Echocardiogram that admission continue to show an LVEF 20-25%, moderate LAE, mild MR.   She was also discharged on amiodarone 200 mg twice daily.  She presents for cardiology follow-up after this hospitalization. She states she was told she was in Afib but I don't see documentation of this. She has been on 200 mg amiodarone BID since discharge. She no longer feels "fluttering" and appears euvolemic today.   She is seems to be a poor historian, but is compliant on medications and had a good understanding of what she takes.    Past Medical History:  Diagnosis Date   Alkaline phosphatase elevation    Asthma    Breast cancer (HCC) 2011   Carpal tunnel syndrome    bilateral   Chronic HFrEF (heart failure with reduced ejection fraction) (HCC)    Cirrhosis, non-alcoholic (HCC)    Closed fracture of unspecified part of radius (alone)    DDD (degenerative disc disease), lumbar    Depression    pt denies   Diabetes mellitus without complication (HCC)    Enlarged heart    Fibromyalgia    GERD (gastroesophageal reflux disease)    History of kidney stones    HTN (hypertension)    Hypothyroidism    IBS (irritable  bowel syndrome)    LBBB (left bundle branch block)    Microcytic anemia 05/14/2017   Mitral regurgitation    NICM (nonischemic cardiomyopathy) (HCC)    Pneumonia    Presence of permanent cardiac pacemaker    Pulmonary hypertension (HCC)    PVD (peripheral vascular disease) (HCC)    2019   Sleep apnea    uses cpap   Tricuspid regurgitation    Ventricular tachyarrhythmia [facility])     Past Surgical History:  Procedure Laterality Date   BIV PACEMAKER GENERATOR CHANGEOUT N/A 11/03/2021   Procedure: BIV PACEMAKER GENERATOR CHANGEOUT;  Surgeon: Marinus Maw, MD;  Location: [facility] INVASIVE CV LAB;  Service:  Cardiovascular;  Laterality: N/A;   BREAST LUMPECTOMY Left 2011   COLONOSCOPY     FOOT SURGERY     KNEE SURGERY     LEAD REVISION/REPAIR N/A 11/03/2021   Procedure: LEAD REVISION/REPAIR;  Surgeon: Marinus Maw, MD;  Location: [facility] INVASIVE CV LAB;  Service: Cardiovascular;  Laterality: N/A;   MASTECTOMY Right    05/2019   PACEMAKER GENERATOR CHANGE N/A 08/23/2014   Procedure: PACEMAKER GENERATOR CHANGE;  Surgeon: Marinus Maw, MD;  Location: [facility] CATH LAB;  Service: Cardiovascular;  Laterality: N/A;   PACEMAKER PLACEMENT  2009    cardiology   RECTAL SURGERY  1976   REDUCTION MAMMAPLASTY Left    05/2019   TOTAL SHOULDER ARTHROPLASTY Right 03/10/2018   TOTAL SHOULDER ARTHROPLASTY Right 03/10/2018   Procedure: RIGHT TOTAL SHOULDER ARTHROPLASTY;  Surgeon: Jones Broom, MD;  Location: [facility] OR;  Service: Orthopedics;  Laterality: Right;   TUBAL LIGATION      Current Medications: Current Meds  Medication Sig   albuterol (PROVENTIL) (2.5 MG/3ML) 0.083% nebulizer solution Take 2.5 mg by nebulization every 6 (six) hours as needed for wheezing or shortness of breath.   albuterol (VENTOLIN HFA) 108 (90 Base) MCG/ACT inhaler Inhale 1-2 puffs into the lungs every 6 (six) hours as needed for wheezing or shortness of breath.   alendronate (FOSAMAX) 70 MG tablet TAKE 1 TABLET BY MOUTH  EVERY 7 DAYS WITH A FULL  GLASS OF WATER ON AN EMPTY  STOMACH   AMBULATORY NON FORMULARY MEDICATION Medication Name: Tubing and supplies for nebulizer. Fax - [phone]   amiodarone (PACERONE) 200 MG tablet TAKE  200 MG ONCE A DAY MONDAY THRU THURSDAY  TAKE  200 MG TWICE A DAY FRIDAY THROUGH SUNDAY   aspirin 81 MG chewable tablet Chew 81 mg by mouth daily.   Continuous Blood Gluc Receiver (FREESTYLE LIBRE 2 READER) DEVI 6 (six) sensors/90 day supply. 4 refills per year. DX:E11.8   fluticasone-salmeterol (ADVAIR HFA) 115-21 MCG/ACT inhaler Inhale 2 puffs into the lungs 2 (two) times daily.   glucose blood  test strip To be used to check blood glucose Dx:E11.8 FreeStyle Precision blood glucose for Freestyle Libre 2   MAGNESIUM PO Take 1 tablet by mouth daily.   MOTEGRITY 2 MG TABS Take by mouth.   Multiple Vitamins-Minerals (OCUVITE EYE + MULTI) TABS Take 1 tablet by mouth in the morning.   mupirocin cream (BACTROBAN) 2 % Apply 1 Application topically 2 (two) times daily.   Omega-3 Fatty Acids (FISH OIL) 1000 MG CAPS Take 1,000 mg by mouth daily.   pantoprazole (PROTONIX) 40 MG tablet TAKE 1 TABLET BY MOUTH DAILY  BEFORE BREAKFAST   pravastatin (PRAVACHOL) 40 MG tablet Take 1 tablet (40 mg total) by mouth every evening.   senna-docusate (SENOKOT-S) 8.6-50 MG tablet Take 1 tablet by  mouth daily.   Tiotropium Bromide Monohydrate (SPIRIVA RESPIMAT) 1.25 MCG/ACT AERS Inhale 2 puffs into the lungs daily.   triamcinolone cream (KENALOG) 0.1 % Apply 1 Application topically 2 (two) times daily.   VITAMIN D PO Take 1 tablet by mouth daily.   [DISCONTINUED] bumetanide (BUMEX) 1 MG tablet Take 1 tablet (1 mg total) by mouth 2 (two) times daily. Take an extra tablet in the morning, if weight gain 2 to 3 lbs in 24 hrs or 5 lbs in 7 days. (Patient taking differently: Take 2 mg by mouth 2 (two) times daily. Take 2 mg by mouth 2 times a day.)   [DISCONTINUED] levothyroxine (SYNTHROID) 88 MCG tablet TAKE 1 TABLET(88 MCG) BY MOUTH DAILY   [DISCONTINUED] metFORMIN (GLUCOPHAGE-XR) 500 MG 24 hr tablet Take 1 tablet (500 mg total) by mouth 2 (two) times daily with a meal.     Allergies:   Injectafer [ferric carboxymaltose], Penicillins, Accupril [quinapril hcl], Aleve [naproxen], Celebrex [celecoxib], Cozaar [losartan potassium], Flonase [fluticasone], Levomenol, Lipitor [atorvastatin], Nasonex [mometasone furoate], Xyzal [levocetirizine], Bentyl [dicyclomine], Chamomile, Entresto [sacubitril-valsartan], Vesicare [solifenacin], Aspirin, Baking soda-fluoride [sodium fluoride], Clindamycin/lincomycin, Codeine, Furosemide,  Lactose intolerance (gi), Lanolin-petrolatum, Miralax [polyethylene glycol], Quinine, Reclast [zoledronic acid], Rifampin, Sulfa antibiotics, Sulfonamide derivatives, Tramadol, Wellbutrin [bupropion], and Wool alcohol [lanolin]   Social History   Socioeconomic History   Marital status: Divorced    Spouse name: Not on file   Number of children: 2   Years of education: 14   Highest education level: Associate degree: academic program  Occupational History   Occupation: quality insurcance in Sports coach    Comment: retired  Tobacco Use   Smoking status: Former    Packs/day: 0.00    Years: 0.00    Additional pack years: 0.00    Total pack years: 0.00    Types: Cigarettes    Quit date: 08/24/1966    Years since quitting: 56.5   Smokeless tobacco: Never  Vaping Use   Vaping Use: Never used  Substance and Sexual Activity   Alcohol use: No   Drug use: No   Sexual activity: Not Currently  Other Topics Concern   Not on file  Social History Narrative   Retired. Lives with and helps her brother. Likes Risk manager, Nutritional therapist and music. Loves animals.   Social Determinants of Health   Financial Resource Strain: Low Risk  (08/06/2022)   Overall Financial Resource Strain (CARDIA)    Difficulty of Paying Living Expenses: Not hard at all  Food Insecurity: No Food Insecurity (02/01/2023)   Hunger Vital Sign    Worried About Running Out of Food in the Last Year: Never true    Ran Out of Food in the Last Year: Never true  Transportation Needs: No Transportation Needs (02/01/2023)   PRAPARE - Administrator, Civil Service (Medical): No    Lack of Transportation (Non-Medical): No  Physical Activity: Sufficiently Active (08/06/2022)   Exercise Vital Sign    Days of Exercise per Week: 5 days    Minutes of Exercise per Session: 60 min  Stress: No Stress Concern Present (08/06/2022)   [employer] of Occupational Health - Occupational Stress Questionnaire    Feeling  of Stress : Not at all  Social Connections: Moderately Integrated (08/06/2022)   Social Connection and Isolation Panel [NHANES]    Frequency of Communication with Friends and Family: More than three times a week    Frequency of Social Gatherings with Friends and Family: Three times a week  Attends Religious Services: More than 4 times per year    Active Member of Clubs or Organizations: Yes    Attends Engineer, structural: More than 4 times per year    Marital Status: Divorced     Family History: The patient's family history includes Breast cancer in an other family member; Heart disease in her brother, father, and mother; Heart failure in an other family member; Hypertension in an other family member; Melanoma in her mother; Parkinson's disease in her mother.  ROS:   Please see the history of present illness.     All other systems reviewed and are negative.  EKGs/Labs/Other Studies Reviewed:    The following studies were reviewed today:  Echo 10/30/22:  1. Left ventricular ejection fraction, by estimation, is 20 to 25%. The  left ventricle has severely decreased function. The left ventricle  demonstrates global hypokinesis. The left ventricular internal cavity size  was moderately dilated. Left  ventricular diastolic parameters are consistent with Grade II diastolic  dysfunction (pseudonormalization).   2. Right ventricular systolic function is moderately reduced. The right  ventricular size is normal.   3. Left atrial size was severely dilated.   4. Right atrial size was moderately dilated.   5. The mitral valve is normal in structure. Mild mitral valve  regurgitation. No evidence of mitral stenosis.   6. The aortic valve is tricuspid. There is mild calcification of the  aortic valve. Aortic valve regurgitation is not visualized. Aortic valve  sclerosis/calcification is present, without any evidence of aortic  stenosis. Aortic valve area, by VTI measures   2.29 cm.  Aortic valve mean gradient measures 5.0 mmHg. Aortic valve Vmax  measures 1.55 m/s.   7. The inferior vena cava is normal in size with greater than 50%  respiratory variability, suggesting right atrial pressure of 3 mmHg.    EKG:  EKG is not ordered today.    Recent Labs: 09/25/2022: TSH 3.880 10/29/2022: ALT 25; B Natriuretic Peptide 535.5 10/30/2022: Hemoglobin 12.6; Platelets 202 11/01/2022: Magnesium 2.3 11/24/2022: NT-Pro BNP 1,021 12/21/2022: BUN 16; Creatinine, Ser 0.99; Potassium 5.1; Sodium 141  Recent Lipid Panel    Component Value Date/Time   CHOL 168 04/16/2021 0000   TRIG 120 04/16/2021 0000   HDL 73 04/16/2021 0000   CHOLHDL 2.3 04/16/2021 0000   VLDL 26 02/18/2017 0939   LDLCALC 74 04/16/2021 0000     Risk Assessment/Calculations:                Physical Exam:    VS:  BP 132/70 (BP Location: Left Arm, Patient Position: Sitting, Cuff Size: Large)   Pulse 79   Ht 5\' 1"  (1.549 m)   Wt 217 lb 3.2 oz (98.5 kg)   SpO2 97%   BMI 41.04 kg/m     Wt Readings from Last 3 Encounters:  02/09/23 217 lb 3.2 oz (98.5 kg)  12/28/22 222 lb (100.7 kg)  12/24/22 221 lb 9 oz (100.5 kg)     GEN:  Well nourished, well developed in no acute distress HEENT: Normal NECK: No JVD; No carotid bruits LYMPHATICS: No lymphadenopathy CARDIAC: RRR, no murmurs, rubs, gallops RESPIRATORY:  Clear to auscultation without rales, wheezing or rhonchi  ABDOMEN: Soft, non-tender, non-distended MUSCULOSKELETAL:  No edema; No deformity  SKIN: Warm and dry NEUROLOGIC:  Alert and oriented x 3 PSYCHIATRIC:  Normal affect   ASSESSMENT:    1. Chronic systolic congestive heart failure (HCC)   2. Medication management   3.  NICM (nonischemic cardiomyopathy) (HCC)   4. VT (ventricular tachycardia) (HCC)   5. Palpitations   6. Biventricular cardiac pacemaker in situ   7. OSA (obstructive sleep apnea)   8. Complete heart block (HCC)    PLAN:    In order of problems listed  above:  Nonischemic cardiomyopathy Chronic systolic and diastolic heart failure Moderately reduced RV function - cath at OSH 01/2020 with normal coronaries - has not tolerated ARB due to itching -Echo 10/2022 with an LVEF 20-25% with global hypokinesis, grade 2 DD, moderately reduced RV function - discharged on 2 mg bumex BID and has done well with this - BMP today   CRT-D in place VT - on amiodarone 200 mg BID - coreg was stopped - tried on 0.125 mg digoxin - felt bad, had a rash, but question shingles and contact dermatitis - will resume prior amiodarone dosing regimen of 200 mg daily except F, Sat, Sun in which she takes 200 mg BID   Palpitations / heart fluttering Notes from care everywhere reviewed and mention Afib.  I do not see an EKG that includes atrial fibrillation in the read.  In addition when I look at her last interrogation on 02/02/2023, I do not see mention of atrial fibrillation.  I hesitate to place her on oral anticoagulation without definitive evidence.  Will refer her back to EP to ensure she does not have evidence of afib and need OAC.   I will also refer back to AHF clinic.       Medication Adjustments/Labs and Tests Ordered: Current medicines are reviewed at length with the patient today.  Concerns regarding medicines are outlined above.  Orders Placed This Encounter  Procedures   Basic metabolic panel   EKG 12-Lead   Meds ordered this encounter  Medications   bumetanide (BUMEX) 2 MG tablet    Sig: Take 1 tablet (2 mg total) by mouth 2 (two) times daily.    Dispense:  90 tablet    Refill:  3    Dose increase- fill later for pt will use supply on hand first and call for further refills.    Patient Instructions  Medication Instructions:   RESUME Amiodarone directions as discussed. Take 200 mg daily and on Fridays, Saturdays, and Sundays take 200 mg 2 times a day  CONTINUE Bumex 2 mg 2 times a day as discussed   *If you need a refill on your cardiac  medications before your next appointment, please call your pharmacy*  Lab Work: Your physician recommends that you have lab work TODAY:  BMP  If you have labs (blood work) drawn today and your tests are completely normal, you will receive your results only by: MyChart Message (if you have MyChart) OR A paper copy in the mail If you have any lab test that is abnormal or we need to change your treatment, we will call you to review the results.  Testing/Procedures: NONE ordered at this time of appointment   Follow-Up: At [facility], you and your health needs are our priority.  As part of our continuing mission to provide you with exceptional heart care, we have created designated Provider Care Teams.  These Care Teams include your primary Cardiologist (physician) and Advanced Practice Providers (APPs -  Physician Assistants and Nurse Practitioners) who all work together to provide you with the care you need, when you need it.   Your next appointment:   THIS WEEK    Provider:   You may  see Lewayne Bunting, MD or one of the following Advanced Practice Providers on your designated Care Team:   Francis Dowse, [place] "Mardelle Matte" [place], [place] Sherie Don, NP     Other Instructions     Signed, Marcelino Duster, [place]  02/09/2023 1:40 PM    [facility]

## 2023-02-08 DIAGNOSIS — K7581 Nonalcoholic steatohepatitis (NASH): Secondary | ICD-10-CM | POA: Diagnosis not present

## 2023-02-08 DIAGNOSIS — K219 Gastro-esophageal reflux disease without esophagitis: Secondary | ICD-10-CM | POA: Diagnosis not present

## 2023-02-08 DIAGNOSIS — R140 Abdominal distension (gaseous): Secondary | ICD-10-CM | POA: Diagnosis not present

## 2023-02-08 DIAGNOSIS — K581 Irritable bowel syndrome with constipation: Secondary | ICD-10-CM | POA: Diagnosis not present

## 2023-02-09 ENCOUNTER — Other Ambulatory Visit: Payer: Self-pay | Admitting: *Deleted

## 2023-02-09 ENCOUNTER — Encounter: Payer: Self-pay | Admitting: Physician Assistant

## 2023-02-09 ENCOUNTER — Ambulatory Visit: Payer: 59 | Attending: Adult Health | Admitting: Physician Assistant

## 2023-02-09 ENCOUNTER — Encounter: Payer: Self-pay | Admitting: Family Medicine

## 2023-02-09 VITALS — BP 132/70 | HR 79 | Ht 61.0 in | Wt 217.2 lb

## 2023-02-09 DIAGNOSIS — I428 Other cardiomyopathies: Secondary | ICD-10-CM

## 2023-02-09 DIAGNOSIS — R002 Palpitations: Secondary | ICD-10-CM

## 2023-02-09 DIAGNOSIS — Z950 Presence of cardiac pacemaker: Secondary | ICD-10-CM

## 2023-02-09 DIAGNOSIS — G4733 Obstructive sleep apnea (adult) (pediatric): Secondary | ICD-10-CM

## 2023-02-09 DIAGNOSIS — E1169 Type 2 diabetes mellitus with other specified complication: Secondary | ICD-10-CM

## 2023-02-09 DIAGNOSIS — I472 Ventricular tachycardia, unspecified: Secondary | ICD-10-CM

## 2023-02-09 DIAGNOSIS — I442 Atrioventricular block, complete: Secondary | ICD-10-CM | POA: Diagnosis not present

## 2023-02-09 DIAGNOSIS — Z79899 Other long term (current) drug therapy: Secondary | ICD-10-CM

## 2023-02-09 DIAGNOSIS — I5022 Chronic systolic (congestive) heart failure: Secondary | ICD-10-CM

## 2023-02-09 LAB — BASIC METABOLIC PANEL
BUN/Creatinine Ratio: 15 (ref 12–28)
BUN: 14 mg/dL (ref 8–27)
CO2: 28 mmol/L (ref 20–29)
Calcium: 10.5 mg/dL — ABNORMAL HIGH (ref 8.7–10.3)
Chloride: 101 mmol/L (ref 96–106)
Creatinine, Ser: 0.94 mg/dL (ref 0.57–1.00)
Glucose: 101 mg/dL — ABNORMAL HIGH (ref 70–99)
Potassium: 4 mmol/L (ref 3.5–5.2)
Sodium: 144 mmol/L (ref 134–144)
eGFR: 64 mL/min/{1.73_m2} (ref >59)

## 2023-02-09 MED ORDER — METFORMIN HCL ER 500 MG PO TB24
500.0000 mg | ORAL_TABLET | Freq: Two times a day (BID) | ORAL | 1 refills | Status: DC
Start: 2023-02-09 — End: 2023-02-17

## 2023-02-09 MED ORDER — LEVOTHYROXINE SODIUM 88 MCG PO TABS: 88.0000 ug | ORAL_TABLET | Freq: Every day | ORAL | 0 refills | Status: DC

## 2023-02-09 MED ORDER — BUMETANIDE 2 MG PO TABS
2.0000 mg | ORAL_TABLET | Freq: Two times a day (BID) | ORAL | 3 refills | Status: DC
Start: 2023-02-09 — End: 2023-02-17

## 2023-02-09 NOTE — Patient Instructions (Signed)
Medication Instructions:   RESUME Amiodarone directions as discussed. Take 200 mg daily and on Fridays, Saturdays, and Sundays take 200 mg 2 times a day  CONTINUE Bumex 2 mg 2 times a day as discussed   *If you need a refill on your cardiac medications before your next appointment, please call your pharmacy*  Lab Work: Your physician recommends that you have lab work TODAY:  BMP  If you have labs (blood work) drawn today and your tests are completely normal, you will receive your results only by: MyChart Message (if you have MyChart) OR A paper copy in the mail If you have any lab test that is abnormal or we need to change your treatment, we will call you to review the results.  Testing/Procedures: NONE ordered at this time of appointment   Follow-Up: At [facility], you and your health needs are our priority.  As part of our continuing mission to provide you with exceptional heart care, we have created designated Provider Care Teams.  These Care Teams include your primary Cardiologist (physician) and Advanced Practice Providers (APPs -  Physician Assistants and Nurse Practitioners) who all work together to provide you with the care you need, when you need it.   Your next appointment:   THIS WEEK    Provider:   You may see Lewayne Bunting, MD or one of the following Advanced Practice Providers on your designated Care Team:   Francis Dowse, [place] "Mardelle Matte" [place], [place] Sherie Don, NP     Other Instructions

## 2023-02-10 ENCOUNTER — Telehealth: Payer: Self-pay

## 2023-02-10 ENCOUNTER — Ambulatory Visit (HOSPITAL_COMMUNITY)
Admission: RE | Admit: 2023-02-10 | Discharge: 2023-02-10 | Disposition: A | Discharge status: Home / Self Care | Payer: 59 | Source: Ambulatory Visit | Attending: Internal Medicine | Admitting: Internal Medicine

## 2023-02-10 ENCOUNTER — Other Ambulatory Visit: Payer: Self-pay

## 2023-02-10 VITALS — BP 138/82 | HR 71 | Ht 61.0 in | Wt 217.6 lb

## 2023-02-10 DIAGNOSIS — E785 Hyperlipidemia, unspecified: Secondary | ICD-10-CM | POA: Diagnosis not present

## 2023-02-10 DIAGNOSIS — I472 Ventricular tachycardia, unspecified: Secondary | ICD-10-CM | POA: Insufficient documentation

## 2023-02-10 DIAGNOSIS — I442 Atrioventricular block, complete: Secondary | ICD-10-CM

## 2023-02-10 DIAGNOSIS — I4729 Other ventricular tachycardia: Secondary | ICD-10-CM | POA: Diagnosis not present

## 2023-02-10 DIAGNOSIS — G4733 Obstructive sleep apnea (adult) (pediatric): Secondary | ICD-10-CM | POA: Insufficient documentation

## 2023-02-10 DIAGNOSIS — Z79899 Other long term (current) drug therapy: Secondary | ICD-10-CM | POA: Diagnosis not present

## 2023-02-10 DIAGNOSIS — E119 Type 2 diabetes mellitus without complications: Secondary | ICD-10-CM | POA: Insufficient documentation

## 2023-02-10 DIAGNOSIS — K7460 Unspecified cirrhosis of liver: Secondary | ICD-10-CM | POA: Diagnosis not present

## 2023-02-10 DIAGNOSIS — Z853 Personal history of malignant neoplasm of breast: Secondary | ICD-10-CM | POA: Diagnosis not present

## 2023-02-10 DIAGNOSIS — I5022 Chronic systolic (congestive) heart failure: Secondary | ICD-10-CM | POA: Diagnosis not present

## 2023-02-10 DIAGNOSIS — I110 Hypertensive heart disease with heart failure: Secondary | ICD-10-CM | POA: Diagnosis not present

## 2023-02-10 DIAGNOSIS — Z9010 Acquired absence of unspecified breast and nipple: Secondary | ICD-10-CM | POA: Insufficient documentation

## 2023-02-10 DIAGNOSIS — I428 Other cardiomyopathies: Secondary | ICD-10-CM | POA: Insufficient documentation

## 2023-02-10 NOTE — Progress Notes (Signed)
   02/10/2023  Patient ID: Tanya Hogan, female   DOB: Jul 04, 1951, 72 y.o.   MRN: [id number]  Patient outreach to inform patient that [employer] is requesting that she call to verify she is switching from [employer] 2 Reader and Sensors to [place] [employer].  I provided the patient with their phone number to call and also suggested that she inquire about when they can fill/ship this order to her.  When I spoke with a representative yesterday, they stated it may not be until August; because that is when she would be due for a refill of the [place] 2 sensors.  Patient has my direct number if additional needs arise.  Lenna Gilford, PharmD, DPLA

## 2023-02-10 NOTE — Progress Notes (Signed)
Primary Care Physician: [name], MD Primary Cardiologist: Meriam Sprague, MD Electrophysiologist: Lewayne Bunting, MD     Referring Physician: Micah Flesher, PA-C     Tanya Hogan is a 72 y.o. female with a history of HTN, HLD, T2DM, VT, nonischemic cardiomyopathy, chronic systolic heart failure, OSA on CPAP, CHB s/p CRT-P, nonalcoholic cirrhosis, and history of breast cancer s/p mastectomy. She is here today due to recent outside hospital admission for CHF exacerbation and was discharged on amiodarone 200 mg BID. She was told she was in Afib but in Care Everywhere both ECGs show report of A sense V paced rhythm (I cannot see either ECG). Most recent device check on 02/03/23 showed no Afib as confirmed by Dr. Ladona Ridgel.   On evaluation today, she is currently in A sense V paced rhythm. She believes she had an episode of Afib several months ago when she went to the hospital due to episode of chest pain.    Today, she denies symptoms of palpitations, shortness of breath, orthopnea, PND, lower extremity edema, dizziness, presyncope, syncope, snoring, daytime somnolence, bleeding, or neurologic sequela. The patient is tolerating medications without difficulties and is otherwise without complaint today.    Atrial Fibrillation Risk Factors:  she does have symptoms or diagnosis of sleep apnea. she is compliant with CPAP therapy.  she has a BMI of Body mass index is 41.12 kg/m.[name] Filed Weights   02/10/23 1529  Weight: 98.7 kg    Current Outpatient Medications  Medication Sig Dispense Refill   albuterol (PROVENTIL) (2.5 MG/3ML) 0.083% nebulizer solution Take 2.5 mg by nebulization every 6 (six) hours as needed for wheezing or shortness of breath.     albuterol (VENTOLIN HFA) 108 (90 Base) MCG/ACT inhaler Inhale 1-2 puffs into the lungs every 6 (six) hours as needed for wheezing or shortness of breath. 54 g 3   alendronate (FOSAMAX) 70 MG tablet TAKE 1 TABLET BY MOUTH  EVERY 7  DAYS WITH A FULL  GLASS OF WATER ON AN EMPTY  STOMACH 12 tablet 3   AMBULATORY NON FORMULARY MEDICATION Medication Name: Tubing and supplies for nebulizer. Fax - [phone] 1 vial 0   amiodarone (PACERONE) 200 MG tablet TAKE  200 MG ONCE A DAY MONDAY THRU THURSDAY  TAKE  200 MG TWICE A DAY FRIDAY THROUGH SUNDAY 120 tablet 2   aspirin 81 MG chewable tablet Chew 81 mg by mouth daily.     bisacodyl 5 MG EC tablet Take 5 mg by mouth 2 (two) times daily.     bumetanide (BUMEX) 2 MG tablet Take 1 tablet (2 mg total) by mouth 2 (two) times daily. 90 tablet 3   Continuous Blood Gluc Receiver (FREESTYLE LIBRE 2 READER) DEVI 6 (six) sensors/90 day supply. 4 refills per year. DX:E11.8 6 each 4   fluticasone-salmeterol (ADVAIR HFA) 115-21 MCG/ACT inhaler Inhale 2 puffs into the lungs 2 (two) times daily.     glucose blood test strip To be used to check blood glucose Dx:E11.8 FreeStyle Precision blood glucose for Freestyle Libre 2 100 each 12   levothyroxine (SYNTHROID) 88 MCG tablet Take 1 tablet (88 mcg total) by mouth daily before breakfast. 90 tablet 0   metFORMIN (GLUCOPHAGE-XR) 500 MG 24 hr tablet Take 1 tablet (500 mg total) by mouth 2 (two) times daily with a meal. 180 tablet 1   MOTEGRITY 2 MG TABS Take 1 tablet by mouth every morning.     Multiple Vitamins-Minerals (OCUVITE EYE + MULTI) TABS  Take 1 tablet by mouth in the morning.     mupirocin cream (BACTROBAN) 2 % Apply 1 Application topically 2 (two) times daily. 15 g 0   Omega-3 Fatty Acids (FISH OIL) 1000 MG CAPS Take 1,000 mg by mouth daily.     pantoprazole (PROTONIX) 40 MG tablet TAKE 1 TABLET BY MOUTH DAILY  BEFORE BREAKFAST 90 tablet 3   pravastatin (PRAVACHOL) 40 MG tablet Take 1 tablet (40 mg total) by mouth every evening. 90 tablet 3   senna-docusate (SENOKOT-S) 8.6-50 MG tablet Take 1 tablet by mouth 2 (two) times daily.     Tiotropium Bromide Monohydrate (SPIRIVA RESPIMAT) 1.25 MCG/ACT AERS Inhale 2 puffs into the lungs daily.      triamcinolone cream (KENALOG) 0.1 % Apply 1 Application topically 2 (two) times daily. 30 g 0   VITAMIN D PO Take 1 tablet by mouth daily.     No current facility-administered medications for this encounter.    Atrial Fibrillation Management history:  Previous antiarrhythmic drugs: amiodarone 200 mg Previous cardioversions: None Previous ablations: None Anticoagulation history: None   ROS- All systems are reviewed and negative except as per the HPI above.  Physical Exam: BP 138/82   Pulse 71   Ht 5\' 1"  (1.549 m)   Wt 98.7 kg   BMI 41.12 kg/m   GEN: Well nourished, well developed in no acute distress NECK: No JVD; No carotid bruits CARDIAC: Regular rate and rhythm, no murmurs, rubs, gallops RESPIRATORY:  Clear to auscultation without rales, wheezing or rhonchi  ABDOMEN: Soft, non-tender, non-distended EXTREMITIES:  No edema; No deformity   EKG today demonstrates  Vent. rate 71 BPM PR interval 122 ms QRS duration 190 ms QT/QTcB 522/567 ms P-R-T axes 73 -41 -34 Atrial-sensed ventricular-paced rhythm Abnormal ECG When compared with ECG of 14-Dec-2022 15:22, PREVIOUS ECG IS PRESENT  Echo 01/27/23 (outside hospital) demonstrated: SUMMARY  There is severe global hypokinesis of the left ventricle.  The left ventricle is mildly dilated.  LV ejection fraction = 20-25%.  The left atrium is moderately dilated.  There is mild mitral regurgitation.  No tricuspid regurgitation.  There is no pericardial effusion.  The inferior vena cava was not visualized during the exam.  There is no significant change in comparison with the last study.   ASSESSMENT & PLAN CHB s/p [employer] CRT-P / [place] [employer] representative kind enough to come up to the clinic and interrogated her device today confirming no Afib. The episode in question for patient was not related to atrial fibrillation. There is no indication at this time to begin anticoagulation. Continue amiodarone as previously  directed.  Chronic systolic CHF Continue Bumex BID as directed.   No Afib clinic f/u is indicated at this time. F/u as scheduled with primary cardiologist.    Lake Bells, PA-C  [facility] [address] [place], [place] [address] [phone]

## 2023-02-10 NOTE — Addendum Note (Signed)
Encounter addended by: Eustace Pen, PA-C on: 02/10/2023 4:34 PM  Actions taken: Diagnosis association updated, Visit diagnoses modified, Charge Capture section accepted

## 2023-02-10 NOTE — Telephone Encounter (Signed)
Spoke with pt. Pt was notified of lab results. Pt will continue current medication.

## 2023-02-12 ENCOUNTER — Ambulatory Visit: Payer: 59 | Admitting: Adult Health

## 2023-02-15 ENCOUNTER — Telehealth: Payer: Self-pay | Admitting: Family Medicine

## 2023-02-15 NOTE — Telephone Encounter (Signed)
Patient called stating [facility] is requiring approval to refill her prescription of metFORMIN (GLUCOPHAGE-XR) 500 MG 24 hr tablet [[id number]] .  Patient stated this medication was previously on her do not use list but she no longer has any issues with it.   Patient has a week supply left.

## 2023-02-16 ENCOUNTER — Ambulatory Visit (INDEPENDENT_AMBULATORY_CARE_PROVIDER_SITE_OTHER): Payer: 59 | Admitting: Family Medicine

## 2023-02-16 ENCOUNTER — Encounter: Payer: Self-pay | Admitting: Family Medicine

## 2023-02-16 VITALS — BP 117/50 | HR 69 | Ht 61.0 in | Wt 217.0 lb

## 2023-02-16 DIAGNOSIS — Z7984 Long term (current) use of oral hypoglycemic drugs: Secondary | ICD-10-CM

## 2023-02-16 DIAGNOSIS — R5381 Other malaise: Secondary | ICD-10-CM

## 2023-02-16 DIAGNOSIS — R21 Rash and other nonspecific skin eruption: Secondary | ICD-10-CM

## 2023-02-16 DIAGNOSIS — I10 Essential (primary) hypertension: Secondary | ICD-10-CM | POA: Diagnosis not present

## 2023-02-16 DIAGNOSIS — I428 Other cardiomyopathies: Secondary | ICD-10-CM | POA: Diagnosis not present

## 2023-02-16 DIAGNOSIS — E118 Type 2 diabetes mellitus with unspecified complications: Secondary | ICD-10-CM | POA: Diagnosis not present

## 2023-02-16 LAB — POCT GLYCOSYLATED HEMOGLOBIN (HGB A1C): Hemoglobin A1C: 6.6 % — AB (ref 4.0–5.6)

## 2023-02-16 LAB — POCT UA - MICROALBUMIN
Creatinine, POC: 50 mg/dL
Microalbumin Ur, POC: 10 mg/L

## 2023-02-16 MED ORDER — NYSTATIN 100000 UNIT/GM EX CREA: 1.0000 | TOPICAL_CREAM | Freq: Two times a day (BID) | CUTANEOUS | 3 refills | Status: DC | PRN

## 2023-02-16 NOTE — Assessment & Plan Note (Addendum)
A1C is up a little bit from previous at 6.6 but still well-controlled.  Continue to use continuous glucose monitor.  It has been extraordinarily helpful in managing her blood glucose levels.  Continue current regimen.  Follow-up in 3 months.

## 2023-02-16 NOTE — Telephone Encounter (Signed)
Medication was sent on 6/18

## 2023-02-16 NOTE — Assessment & Plan Note (Signed)
Follows with cardiology.  Volume status is stable today.  No swelling around the ankles.

## 2023-02-16 NOTE — Progress Notes (Addendum)
Established Patient Office Visit  Subjective   Patient ID: Tanya Hogan, female    DOB: Jan 06, 1951  Age: 72 y.o. MRN: [id number]  Chief Complaint  Patient presents with   Diabetes    HPI  Diabetes - no hypoglycemic events. No wounds or sores that are not healing well. No increased thirst or urination. Checking glucose at home.  She uses a CGM.  Taking medications as prescribed without any side effects.  She did follow-up with cardiology on June 18   Needs a refill on cream for rash of her pannus.    She was hospitalized on 6/124 for chest pain and SOB.  She was having acute systolic heart failure.  She was discharged home on June 7.  Discharged home on Bumex twice a day.  Dry weight around 214 pounds.  They also increased her amiodarone to 200 mg twice a day.  Says she really has cut back on milk and cheese to try to reduce her salt intake.  She has been trying to get adequate protein intake in.    ROS    Objective:     BP (!) 117/50   Pulse 69   Ht 5\' 1"  (1.549 m)   Wt 217 lb (98.4 kg)   SpO2 98%   BMI 41.00 kg/m    Physical Exam Vitals and nursing note reviewed.  Constitutional:      Appearance: She is well-developed.  HENT:     Head: Normocephalic and atraumatic.  Cardiovascular:     Rate and Rhythm: Normal rate and regular rhythm.     Heart sounds: Normal heart sounds.  Pulmonary:     Effort: Pulmonary effort is normal.     Breath sounds: Normal breath sounds.  Skin:    General: Skin is warm and dry.  Neurological:     Mental Status: She is alert and oriented to person, place, and time.  Psychiatric:        Behavior: Behavior normal.      Results for orders placed or performed in visit on 02/16/23  POCT glycosylated hemoglobin (Hb A1C)  Result Value Ref Range   Hemoglobin A1C 6.6 (A) 4.0 - 5.6 %   HbA1c POC (<> result, manual entry)     HbA1c, POC (prediabetic range)     HbA1c, POC (controlled diabetic range)    POCT UA - Microalbumin  Result  Value Ref Range   Microalbumin Ur, POC 10 mg/L   Creatinine, POC 50 mg/dL   Albumin/Creatinine Ratio, Urine, POC 30-300       The ASCVD Risk score (Arnett DK, et al., 2019) failed to calculate for the following reasons:   The patient has a prior MI or stroke diagnosis    Assessment & Plan:   Problem List Items Addressed This Visit       Cardiovascular and Mediastinum   Nonischemic cardiomyopathy (HCC)    Follows with cardiology.  Volume status is stable today.  No swelling around the ankles.      Essential hypertension    Pressure looks great today.  Continue current regimen she has had a BMP since starting the Bumex twice a day so potassium was normal.        Endocrine   Controlled diabetes mellitus type 2 with complications (HCC) - Primary    A1C is up a little bit from previous at 6.6 but still well-controlled.  Continue to use continuous glucose monitor.  It has been extraordinarily helpful in managing her blood  glucose levels.  Continue current regimen.  Follow-up in 3 months.      Relevant Orders   POCT glycosylated hemoglobin (Hb A1C) (Completed)   POCT UA - Microalbumin (Completed)     Other   Physical deconditioning    Did encourage her to work on doing some stretching exercising maybe even using some hands as weights at home.  I think this would help with some of the muscle weakness that she has been experiencing.      Other Visit Diagnoses     Rash           Rash-refilled nystatin cream.  Return in about 3 months (around 05/19/2023) for Diabetes follow-up.    Nani Gasser, MD

## 2023-02-16 NOTE — Assessment & Plan Note (Addendum)
Pressure looks great today.  Continue current regimen she has had a BMP since starting the Bumex twice a day so potassium was normal.

## 2023-02-16 NOTE — Assessment & Plan Note (Signed)
Did encourage her to work on doing some stretching exercising maybe even using some hands as weights at home.  I think this would help with some of the muscle weakness that she has been experiencing.

## 2023-02-17 ENCOUNTER — Telehealth: Payer: Self-pay | Admitting: Family Medicine

## 2023-02-17 ENCOUNTER — Telehealth: Payer: Self-pay | Admitting: Cardiology

## 2023-02-17 DIAGNOSIS — I5022 Chronic systolic (congestive) heart failure: Secondary | ICD-10-CM

## 2023-02-17 DIAGNOSIS — Z79899 Other long term (current) drug therapy: Secondary | ICD-10-CM

## 2023-02-17 DIAGNOSIS — E1169 Type 2 diabetes mellitus with other specified complication: Secondary | ICD-10-CM

## 2023-02-17 MED ORDER — BUMETANIDE 2 MG PO TABS
2.0000 mg | ORAL_TABLET | Freq: Two times a day (BID) | ORAL | 3 refills | Status: DC
Start: 2023-02-17 — End: 2023-08-04

## 2023-02-17 MED ORDER — METFORMIN HCL ER 500 MG PO TB24
500.0000 mg | ORAL_TABLET | Freq: Two times a day (BID) | ORAL | 1 refills | Status: DC
Start: 2023-02-17 — End: 2023-08-13

## 2023-02-17 NOTE — Telephone Encounter (Signed)
Pt's medication was sent to pt's pharmacy as requested. Confirmation received.  °

## 2023-02-17 NOTE — Telephone Encounter (Signed)
Pt called.  She is unable to get her Metformin because it was on her allergy list but she's not allergic to it. She did have a side effect of diarrhea but this is no longer the case. We need to send [facility] a note so they can give her a rx.

## 2023-02-17 NOTE — Telephone Encounter (Signed)
Medication sent and Metformin removed from her allergy list. Please inform pt

## 2023-02-17 NOTE — Telephone Encounter (Signed)
*  STAT* If patient is at the pharmacy, call can be transferred to refill team.   1. Which medications need to be refilled? (please list name of each medication and dose if known)   bumetanide (BUMEX) 2 MG tablet   2. Which pharmacy/location (including street and city if local pharmacy) is medication to be sent to?  [facility]) - [place], [place] - [address] [place]   3. Do they need a 30 day or 90 day supply?   90 day  Patient stated she still has some medication.  Patient stated [facility] told her her order was canceled as they had not received her prescription.  Patient wants prescription re-sent to [facility].

## 2023-02-22 DIAGNOSIS — G4733 Obstructive sleep apnea (adult) (pediatric): Secondary | ICD-10-CM | POA: Diagnosis not present

## 2023-03-02 NOTE — Progress Notes (Signed)
Remote pacemaker transmission.   

## 2023-03-04 ENCOUNTER — Telehealth: Payer: 59

## 2023-03-04 ENCOUNTER — Ambulatory Visit (INDEPENDENT_AMBULATORY_CARE_PROVIDER_SITE_OTHER): Payer: 59

## 2023-03-04 DIAGNOSIS — I5022 Chronic systolic (congestive) heart failure: Secondary | ICD-10-CM

## 2023-03-04 DIAGNOSIS — E1169 Type 2 diabetes mellitus with other specified complication: Secondary | ICD-10-CM

## 2023-03-04 DIAGNOSIS — I5023 Acute on chronic systolic (congestive) heart failure: Secondary | ICD-10-CM

## 2023-03-04 NOTE — Patient Instructions (Signed)
Please call the care guide team at [phone] if you need to cancel or reschedule your appointment.   If you are experiencing a Mental Health or Behavioral Health Crisis or need someone to talk to, please call the Suicide and Crisis Lifeline: 988 call the [place] National Suicide Prevention Lifeline: (919) 621-8093 or TTY: 4081821232 TTY 229-345-1356) to talk to a trained counselor call 1-800-273-TALK (toll free, 24 hour hotline) go to [facility] [address] [place] [phone])   Following is a copy of the CCM Program Consent:  CCM service includes personalized support from designated clinical staff supervised by the physician, including individualized plan of care and coordination with other care providers 24/7 contact phone numbers for assistance for urgent and routine care needs. Service will only be billed when office clinical staff spend 20 minutes or more in a month to coordinate care. Only one practitioner may furnish and bill the service in a calendar month. The patient may stop CCM services at amy time (effective at the end of the month) by phone call to the office staff. The patient will be responsible for cost sharing (co-pay) or up to 20% of the service fee (after annual deductible is met)  Following is a copy of your full provider care plan:   Goals Addressed             This Visit's Progress    CCM Expected Outcome:  Monitor, Self-Manage and Reduce Symptoms of Diabetes       Current Barriers:  Knowledge Deficits related to constipation and having to change medications to see if constipation is resolved and blood sugar elevations after medication changes Chronic Disease Management support and education needs related to effective management of DM Lab Results  Component Value Date   HGBA1C 6.6 (A) 02/16/2023     Planned Interventions: Provided education to patient about basic DM disease process. The patient states her blood  sugars have been bouncing around since she has not been taking her Ozempic. She is back taking Metformin 500 mg BID. She states that she is monitoring and her blood sugars are going up more after eating but then coming back down. She is more stable with the Ozempic but it causes her to have more GI issues. The patient did have a slightly elevated A1C at her last pcp visit but she feels she is doing well at managing her DM health and well being.  Reviewed medications with patient and discussed importance of medication adherence. The patient is  taking Metformin 500 mg BID. The patient is working with the pharm D as needed for effective management of her medications. She says she is doing better about not missing her medications. Talked to the patient about setting alarms on her phone as a reminder to take her medications but she does not want to do that right now. She feels overwhelmed with a lot of the things she has to do and caring for her brother. Reflective listening and support given;        Reviewed prescribed diet with patient heart healthy/ADA. Review and education provided. Education and support given ; Counseled on importance of regular laboratory monitoring as prescribed. Has regular labwork. Labs up to date. Slight trend up of A1C. Discussed plans with patient for ongoing care management follow up and provided patient with direct contact information for care management team;      Provided patient with written educational materials related to hypo and hyperglycemia and importance of correct treatment.  Denies any issues with hypo or hyperglycemia. The patient denies any acute highs or lows right now. .;       Reviewed scheduled/upcoming provider appointments including: 05-20-2023;         Advised patient, providing education and rationale, to check cbg twice daily and when you have symptoms of low or high blood sugar and record. Currently the patient has a continuous reader. She states that it has  been 100 to 324. She states fasting it is 120 to 130.  7 day average has been around 142 when saw the doctor last. Usually after eating it goes up to 200's. Review of goal of fasting is <130 and post prandial of <180.   She did ask for assistance on finding out about the freestyle libre from 2 to 3. She had called the office and ask about it and they are working on it. Education and support given. Will continue to monitor for changes.    Referral made to pharmacy team for assistance with PAP for DM medications. The patient is working with the pharm D for medication management and support Review of patient status, including review of consultants reports, relevant laboratory and other test results, and medications completed;       Advised patient to discuss changes in DM, questions, or concerns with provider;      Screening for signs and symptoms of depression related to chronic disease state;        Assessed social determinant of health barriers;  The patient has made positive changes in her habits and lifestyle. She feels like she is not as stressed. She says going to the senior center is helping her with her socialization and having a better relationship with her family as well. 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 Talked about self care.  Encouraged the patient to continue doing things she enjoys. Review and updates given. The patients son is currently in the hospital and she is concerned about him but he is doing better. She is trying to focus on not overdoing activity and pacing her activity. She does not go outside when the temperatures outside are >85.   Symptom Management: Take medications as prescribed   Attend all scheduled provider appointments Call provider office for new concerns or questions  call the Suicide and Crisis Lifeline: 988 call the [place] National Suicide Prevention Lifeline: 972-025-9553 or TTY:  562-734-0899 TTY 7696950803) to talk to a trained counselor call 1-800-273-TALK (toll free, 24 hour hotline) if experiencing a Mental Health or Behavioral Health Crisis  check feet daily for cuts, sores or redness enter blood sugar readings and medication or insulin into daily log take the blood sugar log to all doctor visits trim toenails straight across manage portion size keep feet up while sitting wash and dry feet carefully every day wear comfortable, cotton socks wear comfortable, well-fitting shoes  Follow Up Plan: Telephone follow up appointment with care management team member scheduled for: 04-15-2023 at 345 pm       CCM Expected Outcome:  Monitor, Self-Manage and Reduce Symptoms of Heart Failure       Current Barriers:  Knowledge Deficits related to the biventricular pacing system and the generator changes and fully understanding the pacing system and how it works Chronic Disease Management support and education needs related to effective management of HF EF% 20-25 % in May of 2023, 08-29-2022 EF 25 to  30%  Pacemaker generator setting changed in May of 2023. During hospitalization they discussed her upgrading her pacemaker to an ICD- the patient is unsure if she wants to do this Post discharge from the hospital for SVT 08-28-2022 to 09-03-2022, post discharge from the hospital 10-29-2022 to 11-01-2022  Wt Readings from Last 3 Encounters:  02/16/23 217 lb (98.4 kg)  02/10/23 217 lb 9.6 oz (98.7 kg)  02/09/23 217 lb 3.2 oz (98.5 kg)  Self reported on 03-04-2023 is 214  Planned Interventions: Basic overview and discussion of pathophysiology of Heart Failure reviewed. Patient states her dry weight is 214 and this is good for her. She is weighing daily and her weight today was 214. The patient is taking Bumex 1.5 tablets in the am, 1 tablet in pm. She states currently she is staying at her baseline and feeling better. She has made positive dietary changes that help with hidden sodium.  The patient denies any acute changes in her HF or management of her HF>   Provided education on low sodium diet/ADA. Education and review. The patient is compliant with dietary restrictions. She has had issues in the past with hyperkalemia and she is mindful of this. Education on monitoring sodium content of foods closely.  She has basically cut out eating cheese  She even has stopped putting cheese in her salads. Praised the patient for positive changes. She states sometimes she does backslide and sometimes she does not know the content of sodium in foods, but she is monitoring closer.  Reviewed Heart Failure Action Plan in depth and provided written copy.Review with the patient the plan of care for changes in her weight. She has got her fluid pretty much balanced and also her bowels are moving more effectively now. Assessed need for readable accurate scales in home. She is weighing daily and she is monitoring closely for changes. She is in close contact with the HF clinic and has the number to call them.   Provided education about placing scale on hard, flat surface Advised patient to weigh each morning after emptying bladder Discussed importance of daily weight and advised patient to weigh and record daily. The patient is compliant with weighing at this time.  She has to sleep with 3 pillows and she easily gets short of breath but states her oxygen stays up. She has a new sims card for her bipap machine and is using it now at night.  Reviewed role of diuretics in prevention of fluid overload and management of heart failure. The patient has parameters and is taking her Bumex as instructed.The patient is in much better spirits today and feels a lot better.  Discussed the importance of keeping all appointments with provider. 05-20-2023 with the pcp, reminder given today. Sees cardiologist regularly.  Provided patient with education about the role of exercise in the management of heart failure. The patient is  going to the senior center 3 days a week and doing exercises there. She likes this better than what she was doing at the [facility]. She works out about 45 minutes each time she goes. She is also doing mahjong, a game she had been wanting to learn to play. She says the socialization and activity have been a positive thing for her. She is optimistic and taking it "one day at a time". The patient wants to do what she can to take care of herself and her chronic conditions. She is thankful for new things she is doing and the support of her  family as well. Advised patient to discuss changes in HF or other  with provider Screening for signs and symptoms of depression related to chronic disease state  Assessed social determinant of health barriers  Symptom Management: Take medications as prescribed   Attend all scheduled provider appointments Call provider office for new concerns or questions  call the Suicide and Crisis Lifeline: 988 call the [place] National Suicide Prevention Lifeline: (813)669-8703 or TTY: 517-696-3967 TTY (901)775-5976) to talk to a trained counselor call 1-800-273-TALK (toll free, 24 hour hotline) if experiencing a Mental Health or Behavioral Health Crisis  call office if I gain more than 2 pounds in one day or 5 pounds in one week keep legs up while sitting track weight in diary use salt in moderation watch for swelling in feet, ankles and legs every day weigh myself daily begin a heart failure diary bring diary to all appointments develop a rescue plan follow rescue plan if symptoms flare-up track symptoms and what helps feel better or worse dress right for the weather, hot or cold  Follow Up Plan: Telephone follow up appointment with care management team member scheduled for: 04-15-2023 at 345 pm          Patient verbalizes understanding of instructions and care plan provided today and agrees to view in MyChart. Active MyChart status and patient understanding of how to  access instructions and care plan via MyChart confirmed with patient.  Telephone follow up appointment with care management team member scheduled for: 04-15-2023 at 345 pm

## 2023-03-04 NOTE — Chronic Care Management (AMB) (Signed)
Chronic Care Management   CCM RN Visit Note  03/04/2023 Name: Tanya Hogan MRN: [id number] DOB: [birth year]  Subjective: Tanya Hogan is a 72 y.o. year old female who is a primary care patient of [name], Barbarann Ehlers, MD. The patient was referred to the Chronic Care Management team for assistance with care management needs subsequent to provider initiation of CCM services and plan of care.    Today's Visit:  Engaged with patient by telephone for follow up visit.     SDOH Interventions Today    Flowsheet Row Most Recent Value  SDOH Interventions   Health Literacy Interventions Intervention Not Indicated         Goals Addressed             This Visit's Progress    CCM Expected Outcome:  Monitor, Self-Manage and Reduce Symptoms of Diabetes       Current Barriers:  Knowledge Deficits related to constipation and having to change medications to see if constipation is resolved and blood sugar elevations after medication changes Chronic Disease Management support and education needs related to effective management of DM Lab Results  Component Value Date   HGBA1C 6.6 (A) 02/16/2023     Planned Interventions: Provided education to patient about basic DM disease process. The patient states her blood sugars have been bouncing around since she has not been taking her Ozempic. She is back taking Metformin 500 mg BID. She states that she is monitoring and her blood sugars are going up more after eating but then coming back down. She is more stable with the Ozempic but it causes her to have more GI issues. The patient did have a slightly elevated A1C at her last pcp visit but she feels she is doing well at managing her DM health and well being.  Reviewed medications with patient and discussed importance of medication adherence. The patient is  taking Metformin 500 mg BID. The patient is working with the pharm D as needed for effective management of her medications. She says she is doing  better about not missing her medications. Talked to the patient about setting alarms on her phone as a reminder to take her medications but she does not want to do that right now. She feels overwhelmed with a lot of the things she has to do and caring for her brother. Reflective listening and support given;        Reviewed prescribed diet with patient heart healthy/ADA. Review and education provided. Education and support given ; Counseled on importance of regular laboratory monitoring as prescribed. Has regular labwork. Labs up to date. Slight trend up of A1C. Discussed plans with patient for ongoing care management follow up and provided patient with direct contact information for care management team;      Provided patient with written educational materials related to hypo and hyperglycemia and importance of correct treatment. Denies any issues with hypo or hyperglycemia. The patient denies any acute highs or lows right now. .;       Reviewed scheduled/upcoming provider appointments including: 05-20-2023;         Advised patient, providing education and rationale, to check cbg twice daily and when you have symptoms of low or high blood sugar and record. Currently the patient has a continuous reader. She states that it has been 100 to 324. She states fasting it is 120 to 130.  7 day average has been around 142 when saw the doctor last. Usually after eating it goes  up to 200's. Review of goal of fasting is <130 and post prandial of <180.   She did ask for assistance on finding out about the freestyle libre from 2 to 3. She had called the office and ask about it and they are working on it. Education and support given. Will continue to monitor for changes.    Referral made to pharmacy team for assistance with PAP for DM medications. The patient is working with the pharm D for medication management and support Review of patient status, including review of consultants reports, relevant laboratory and other test  results, and medications completed;       Advised patient to discuss changes in DM, questions, or concerns with provider;      Screening for signs and symptoms of depression related to chronic disease state;        Assessed social determinant of health barriers;  The patient has made positive changes in her habits and lifestyle. She feels like she is not as stressed. She says going to the senior center is helping her with her socialization and having a better relationship with her family as well. 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 Talked about self care.  Encouraged the patient to continue doing things she enjoys. Review and updates given. The patients son is currently in the hospital and she is concerned about him but he is doing better. She is trying to focus on not overdoing activity and pacing her activity. She does not go outside when the temperatures outside are >85.   Symptom Management: Take medications as prescribed   Attend all scheduled provider appointments Call provider office for new concerns or questions  call the Suicide and Crisis Lifeline: 988 call the [place] National Suicide Prevention Lifeline: 220-167-1466 or TTY: 515-454-7944 TTY 571-790-6157) to talk to a trained counselor call 1-800-273-TALK (toll free, 24 hour hotline) if experiencing a Mental Health or Behavioral Health Crisis  check feet daily for cuts, sores or redness enter blood sugar readings and medication or insulin into daily log take the blood sugar log to all doctor visits trim toenails straight across manage portion size keep feet up while sitting wash and dry feet carefully every day wear comfortable, cotton socks wear comfortable, well-fitting shoes  Follow Up Plan: Telephone follow up appointment with care management team member scheduled for: 04-15-2023 at 345 pm       CCM Expected Outcome:  Monitor, Self-Manage and  Reduce Symptoms of Heart Failure       Current Barriers:  Knowledge Deficits related to the biventricular pacing system and the generator changes and fully understanding the pacing system and how it works Chronic Disease Management support and education needs related to effective management of HF EF% 20-25 % in May of 2023, 08-29-2022 EF 25 to 30%  Pacemaker generator setting changed in May of 2023. During hospitalization they discussed her upgrading her pacemaker to an ICD- the patient is unsure if she wants to do this Post discharge from the hospital for SVT 08-28-2022 to 09-03-2022, post discharge from the hospital 10-29-2022 to 11-01-2022  Wt Readings from Last 3 Encounters:  02/16/23 217 lb (98.4 kg)  02/10/23 217 lb 9.6 oz (98.7 kg)  02/09/23 217 lb 3.2 oz (98.5 kg)  Self reported on 03-04-2023 is 214  Planned Interventions: Basic overview and discussion of pathophysiology of Heart Failure reviewed. Patient states her dry weight is  214 and this is good for her. She is weighing daily and her weight today was 214. The patient is taking Bumex 1.5 tablets in the am, 1 tablet in pm. She states currently she is staying at her baseline and feeling better. She has made positive dietary changes that help with hidden sodium. The patient denies any acute changes in her HF or management of her HF>   Provided education on low sodium diet/ADA. Education and review. The patient is compliant with dietary restrictions. She has had issues in the past with hyperkalemia and she is mindful of this. Education on monitoring sodium content of foods closely.  She has basically cut out eating cheese  She even has stopped putting cheese in her salads. Praised the patient for positive changes. She states sometimes she does backslide and sometimes she does not know the content of sodium in foods, but she is monitoring closer.  Reviewed Heart Failure Action Plan in depth and provided written copy.Review with the patient the plan of  care for changes in her weight. She has got her fluid pretty much balanced and also her bowels are moving more effectively now. Assessed need for readable accurate scales in home. She is weighing daily and she is monitoring closely for changes. She is in close contact with the HF clinic and has the number to call them.   Provided education about placing scale on hard, flat surface Advised patient to weigh each morning after emptying bladder Discussed importance of daily weight and advised patient to weigh and record daily. The patient is compliant with weighing at this time.  She has to sleep with 3 pillows and she easily gets short of breath but states her oxygen stays up. She has a new sims card for her bipap machine and is using it now at night.  Reviewed role of diuretics in prevention of fluid overload and management of heart failure. The patient has parameters and is taking her Bumex as instructed.The patient is in much better spirits today and feels a lot better.  Discussed the importance of keeping all appointments with provider. 05-20-2023 with the pcp, reminder given today. Sees cardiologist regularly.  Provided patient with education about the role of exercise in the management of heart failure. The patient is going to the senior center 3 days a week and doing exercises there. She likes this better than what she was doing at the [facility]. She works out about 45 minutes each time she goes. She is also doing mahjong, a game she had been wanting to learn to play. She says the socialization and activity have been a positive thing for her. She is optimistic and taking it "one day at a time". The patient wants to do what she can to take care of herself and her chronic conditions. She is thankful for new things she is doing and the support of her family as well. Advised patient to discuss changes in HF or other  with provider Screening for signs and symptoms of depression related to chronic disease state   Assessed social determinant of health barriers  Symptom Management: Take medications as prescribed   Attend all scheduled provider appointments Call provider office for new concerns or questions  call the Suicide and Crisis Lifeline: 988 call the [place] National Suicide Prevention Lifeline: 385 580 8609 or TTY: 727-811-8966 TTY (508)651-4586) to talk to a trained counselor call 1-800-273-TALK (toll free, 24 hour hotline) if experiencing a Mental Health or Behavioral Health Crisis  call office if I  gain more than 2 pounds in one day or 5 pounds in one week keep legs up while sitting track weight in diary use salt in moderation watch for swelling in feet, ankles and legs every day weigh myself daily begin a heart failure diary bring diary to all appointments develop a rescue plan follow rescue plan if symptoms flare-up track symptoms and what helps feel better or worse dress right for the weather, hot or cold  Follow Up Plan: Telephone follow up appointment with care management team member scheduled for: 04-15-2023 at 345 pm          Plan:Telephone follow up appointment with care management team member scheduled for:  04-15-2023 at 345 pm  Alto Denver RN, MSN, CCM RN Care Manager  Chronic Care Management Direct Number: [phone]

## 2023-03-17 DIAGNOSIS — R1032 Left lower quadrant pain: Secondary | ICD-10-CM | POA: Diagnosis not present

## 2023-03-17 DIAGNOSIS — R195 Other fecal abnormalities: Secondary | ICD-10-CM | POA: Diagnosis not present

## 2023-03-17 DIAGNOSIS — K9289 Other specified diseases of the digestive system: Secondary | ICD-10-CM | POA: Diagnosis not present

## 2023-03-17 DIAGNOSIS — K581 Irritable bowel syndrome with constipation: Secondary | ICD-10-CM | POA: Diagnosis not present

## 2023-03-17 DIAGNOSIS — R1011 Right upper quadrant pain: Secondary | ICD-10-CM | POA: Diagnosis not present

## 2023-03-17 DIAGNOSIS — K146 Glossodynia: Secondary | ICD-10-CM | POA: Diagnosis not present

## 2023-03-19 NOTE — Progress Notes (Signed)
Cardiology Office Note:  .   Date:  03/29/2023  ID:  Tanya Hogan, DOB 03/09/1951, MRN [id number] PCP: Tanya Games, MD  [facility] Providers Cardiologist:  Tanya Sprague, MD Electrophysiologist:  Tanya Bunting, MD    Patient Profile: .      PMH: Hypertension Hyperlipidemia Ventricular tachycardia Nonischemic cardiomyopathy Normal coronary arteries on cath 02/06/2020 ([facility]) Chronic HFrEF Type 2 DM OSA Complete heart block s/p CRT-P initial placement 2004 Device upgrade 2013 Nonalcoholic cirrhosis of the liver Breast cancer S/p mastectomy  Longstanding history of nonischemic cardiomyopathy with device placement 2004. She had an upgrade for CRT-D in 2013. Ischemic evaluation 01/2020 with normal coronary arteries and EF 35 to 40%.  Admission at [facility] 08/2021 for CHF exacerbation and echo with EF 20%.  Admission January 2024 with VT converted to paced rhythm on IV amiodarone.  She was referred to EP and was continued on amiodarone.  Referred to advanced heart failure clinic and seen by Dr. Gasper Hogan on 12/14/2022.  She has not been able to tolerate vasodilators or ARB's (itching).  Carvedilol was discontinued and she was started on digoxin 0.125 mg daily.  At baseline she takes Bumex 1 mg twice daily.  Given her functional limitation she was not considered a candidate for advanced therapies including LVAD or home inotropes. Felt to be end stage heart failure.   Last cardiology clinic visit was 02/09/2023 with Tanya Gavia, PA.  She developed a rash and discontinued digoxin.  She was [facility] to [facility] with CHF exacerbation 01/2023.  Was diuresed with IV Bumex 2 mg twice daily and discharged on oral Bumex 2 mg twice daily.  She was advised to increase amiodarone to 200 mg twice daily and was told she was in A-fib. [name], PA was unable to find documentation of this she was advised to take amiodarone 200 mg daily except Friday, Saturday, Sunday in which  she takes 200 mg twice daily.       History of Present Illness: .   Tanya Hogan is a pleasant 72 y.o. female who is here alone today for follow-up. She reports having heart flutters for 20+ years and is grateful that we discovered the cause of these and prescribed appropriate medication. Occasional palpitations for which she sits and rests and symptoms resolve. Reports "I feel the best I have felt in a long time." Is getting around well on her own with the help of her rolling Brummet. After stopping digoxin, she was found to have shingles and contact dermatitis. Has issues with chronic constipation. Weight is stable at 214 lb but 210 lb is base weight if her bowels are moving regularly. Fiber intake is limited by leaky gut and diverticulitis. Has chronic abdominal distention. No orthopnea, PND, edema, dyspnea, chest pain, presyncope or syncope. BP has been well controlled with the exception of an occasional low BP.   ROS: See HPI       Studies Reviewed: .         Risk Assessment/Calculations:             Physical Exam:   VS:  BP 130/72   Pulse 85   Ht 5\' 1"  (1.549 m)   Wt 217 lb 9.6 oz (98.7 kg)   SpO2 97%   BMI 41.12 kg/m    Wt Readings from Last 3 Encounters:  03/29/23 217 lb 9.6 oz (98.7 kg)  03/22/23 217 lb 8 oz (98.7 kg)  02/16/23 217 lb (98.4 kg)  GEN: Obese, well developed in no acute distress NECK: No JVD; No carotid bruits CARDIAC: RRR, no murmurs, rubs, gallops RESPIRATORY:  Clear to auscultation without rales, wheezing or rhonchi  ABDOMEN: Soft, non-tender, distended EXTREMITIES:  No edema; No deformity     ASSESSMENT AND PLAN: .    NICM/Chronic HFrEF S/p ICD implant: Long history of NICM with normal coronaries on cath 01/2020. Echo 10/30/2022 LVEF 20 to 25% with global HK, G2 DD, moderately reduced RV function. Appears euvolemic on exam. She denies DOE, orthopnea, PND. Weight is stable. She reports good urine output and stable weight on Bumex 2 mg twice daily.  GDMT has been limited by medication intolerances. Reports she feels best she has in a while. We will continue Bumex. Will get BMP today to ensure stable renal function.   VT s/p CRT-D in place: Occasional palpitations which improve with rest. She has been on an unusual regimen of amiodarone taking 200 mg daily except Friday Saturday and Sunday in which she takes 200 mg twice daily, however she has been on this regimen for some time and is feeling well. We will continue current dosing.   OSA: She asks if she is a candidate for Inspire. Encouraged her to follow-up with sleep medicine provider.   Hyperlipidemia: LDL 74 on 04/16/21. We will recheck today. Continue pravastatin.       Dispo: 6 months with Dr. Jacques Hogan (transfer from Dr. Shari Hogan)  Signed, Tanya Bridegroom, NP-C

## 2023-03-22 ENCOUNTER — Encounter: Payer: Self-pay | Admitting: Family Medicine

## 2023-03-22 ENCOUNTER — Ambulatory Visit (INDEPENDENT_AMBULATORY_CARE_PROVIDER_SITE_OTHER): Payer: 59

## 2023-03-22 ENCOUNTER — Ambulatory Visit: Payer: 59 | Admitting: Family Medicine

## 2023-03-22 ENCOUNTER — Telehealth: Payer: Self-pay

## 2023-03-22 VITALS — BP 113/72 | HR 81 | Temp 98.1°F | Resp 18 | Ht 61.0 in | Wt 217.5 lb

## 2023-03-22 DIAGNOSIS — M79672 Pain in left foot: Secondary | ICD-10-CM | POA: Diagnosis not present

## 2023-03-22 DIAGNOSIS — L84 Corns and callosities: Secondary | ICD-10-CM | POA: Diagnosis not present

## 2023-03-22 DIAGNOSIS — S90452A Superficial foreign body, left great toe, initial encounter: Secondary | ICD-10-CM | POA: Diagnosis not present

## 2023-03-22 MED ORDER — FREESTYLE LIBRE 3 READER DEVI: 0 refills | Status: DC

## 2023-03-22 NOTE — Progress Notes (Signed)
   Established Patient Office Visit  Subjective   Patient ID: Tanya Hogan, female    DOB: February 04, 1951  Age: 72 y.o. MRN: [id number]  Chief Complaint  Patient presents with   Blister    Patient states that she is here for a blister on her left foot that wont heal, she states that about 3 weeks ago she stepped on a piece of glass or hard plastic and every since then the blister will not heal    HPI  Presents today for an acute visit with complaint of non-healing "blister" on left foot after stepping on glass/sharp plastic.  Has picture today of toe, there is no erythema or purulence. Skin intact.  Symptoms have been present for 3 weeks without improvement. Reports pain is worse at night, gets up to walk around and it feels better.  Associated symptoms include: tender to touch.  Pertinent negatives: no fever or chills, no purulence.  Pain severity: 4/10  Treatments tried include : tylenol  Treatment effective : yes  Chart review: 02/16/23: A1C 6.6     Review of Systems  Constitutional:  Negative for chills and fever.  Skin:        Left great toe pain after stepping on object 3 weeks ago.       Objective:     BP 113/72   Pulse 81   Temp 98.1 F (36.7 C) (Oral)   Resp 18   Ht 5\' 1"  (1.549 m)   Wt 217 lb 8 oz (98.7 kg)   SpO2 96%   BMI 41.10 kg/m  BP Readings from Last 3 Encounters:  03/22/23 113/72  02/16/23 (!) 117/50  02/10/23 138/82      Physical Exam Vitals and nursing note reviewed.  Pulmonary:     Effort: Pulmonary effort is normal.  Feet:     Left foot:     Skin integrity: Callus present. No ulcer, blister, skin breakdown, erythema or warmth.     Comments: Left great toe with firm area, skin is not open, no erythema, no warmth, no purulence.  Skin:    General: Skin is warm and dry.  Neurological:     General: No focal deficit present.     Mental Status: Mental status is at baseline.     No results found for any visits on 03/22/23.    The  ASCVD Risk score (Arnett DK, et al., 2019) failed to calculate for the following reasons:   The patient has a prior MI or stroke diagnosis    Assessment & Plan:   Problem List Items Addressed This Visit     Callus of toe - Primary   Relevant Orders   DG Foot Complete Left  Call your podiatrist to be seen as soon as possible. There could be a foreign body that needs to be removed.  X-ray to rule out bone involvement and/or foreign body.  Pad area for comfort while awaiting podiatrist input.  Follow-up with PCP.     Return if symptoms worsen or fail to improve.    Novella Olive, FNP

## 2023-03-22 NOTE — Progress Notes (Signed)
   03/22/2023  Patient ID: Tanya Hogan, female   DOB: 08-15-1951, 72 y.o.   MRN: [id number]  S/O Patient outreach to make sure [name] has received Libre 3 sensors  Medication Assistance -Patient has received the Connell 3 sensors from DME supplier through [employer] 3 app is not working on phone, and insurance will not cover another reader since they paid for the Peggs 2 reader in the past 5 years Make an appt to help  -I contacted [employer] (DME supplier) for assistance, and they state insurance will not pay eventhough the sensors require a different reader than what she had previously [employer] rep suggested that patient could purchase out of pocket for $85; she also mentioned that if phone is requiring an upgrade, companies will usually allow based on a healthcare exemption -Sent rx for [employer] reader under [facility] standing order to patient's [facility] to see if part D will cover; and they will not.  I went to submit a PA to plan, but this has already been done and denied according to CoverMyMeds -Contacted [employer] about the $85 OOP program for a reader, and Medicare patients are not eligible for this voucher  A/P -Cash price would be  $285, or we could switch the patient back to [place] sensors that she has a reader for -Patient states phone app is now working but won't communicate with sensor on her. She is going to reach out to [employer] for assistance and they are sending neo strips to patient to use with [name] 2 reader for now  Follow-up:  I will check in with the patient next week to verify she has got the phone app working  Lenna Gilford, PharmD, DPLA

## 2023-03-22 NOTE — Patient Instructions (Addendum)
Call your podiatrist to be seen soon.  Go get x-ray to make sure there isn't any issues with the bone.  Pad it for comfort until you can see the foot specialist.

## 2023-03-23 DIAGNOSIS — L602 Onychogryphosis: Secondary | ICD-10-CM | POA: Diagnosis not present

## 2023-03-23 DIAGNOSIS — S90852A Superficial foreign body, left foot, initial encounter: Secondary | ICD-10-CM | POA: Diagnosis not present

## 2023-03-23 DIAGNOSIS — M79672 Pain in left foot: Secondary | ICD-10-CM | POA: Diagnosis not present

## 2023-03-23 DIAGNOSIS — M79671 Pain in right foot: Secondary | ICD-10-CM | POA: Diagnosis not present

## 2023-03-23 DIAGNOSIS — L84 Corns and callosities: Secondary | ICD-10-CM | POA: Diagnosis not present

## 2023-03-23 DIAGNOSIS — E119 Type 2 diabetes mellitus without complications: Secondary | ICD-10-CM | POA: Diagnosis not present

## 2023-03-24 DIAGNOSIS — I5040 Unspecified combined systolic (congestive) and diastolic (congestive) heart failure: Secondary | ICD-10-CM

## 2023-03-24 DIAGNOSIS — E1159 Type 2 diabetes mellitus with other circulatory complications: Secondary | ICD-10-CM

## 2023-03-25 DIAGNOSIS — E118 Type 2 diabetes mellitus with unspecified complications: Secondary | ICD-10-CM | POA: Diagnosis not present

## 2023-03-25 DIAGNOSIS — B070 Plantar wart: Secondary | ICD-10-CM | POA: Diagnosis not present

## 2023-03-25 DIAGNOSIS — S90852A Superficial foreign body, left foot, initial encounter: Secondary | ICD-10-CM | POA: Diagnosis not present

## 2023-03-27 ENCOUNTER — Encounter: Payer: Self-pay | Admitting: Hematology and Oncology

## 2023-03-29 ENCOUNTER — Telehealth: Payer: Self-pay

## 2023-03-29 ENCOUNTER — Ambulatory Visit: Payer: 59 | Admitting: Cardiology

## 2023-03-29 ENCOUNTER — Encounter: Payer: Self-pay | Admitting: Nurse Practitioner

## 2023-03-29 ENCOUNTER — Ambulatory Visit: Payer: 59 | Admitting: Nurse Practitioner

## 2023-03-29 VITALS — BP 130/72 | HR 85 | Ht 61.0 in | Wt 217.6 lb

## 2023-03-29 DIAGNOSIS — Z950 Presence of cardiac pacemaker: Secondary | ICD-10-CM | POA: Diagnosis not present

## 2023-03-29 DIAGNOSIS — Z79899 Other long term (current) drug therapy: Secondary | ICD-10-CM | POA: Diagnosis not present

## 2023-03-29 DIAGNOSIS — G4733 Obstructive sleep apnea (adult) (pediatric): Secondary | ICD-10-CM

## 2023-03-29 DIAGNOSIS — I4729 Other ventricular tachycardia: Secondary | ICD-10-CM

## 2023-03-29 DIAGNOSIS — I5022 Chronic systolic (congestive) heart failure: Secondary | ICD-10-CM

## 2023-03-29 DIAGNOSIS — I428 Other cardiomyopathies: Secondary | ICD-10-CM | POA: Diagnosis not present

## 2023-03-29 DIAGNOSIS — R002 Palpitations: Secondary | ICD-10-CM | POA: Diagnosis not present

## 2023-03-29 NOTE — Patient Instructions (Addendum)
Medication Instructions:   Your physician recommends that you continue on your current medications as directed. Please refer to the Current Medication list given to you today.   *If you need a refill on your cardiac medications before your next appointment, please call your pharmacy*   Lab Work:  TODAY!!!!LIPID/CMET/CBC  If you have labs (blood work) drawn today and your tests are completely normal, you will receive your results only by: MyChart Message (if you have MyChart) OR A paper copy in the mail If you have any lab test that is abnormal or we need to change your treatment, we will call you to review the results.   Testing/Procedures:  Your physician recommends that you continue on your current medications as directed. Please refer to the Current Medication list given to you today.    Follow-Up: At [facility], you and your health needs are our priority.  As part of our continuing mission to provide you with exceptional heart care, we have created designated Provider Care Teams.  These Care Teams include your primary Cardiologist (physician) and Advanced Practice Providers (APPs -  Physician Assistants and Nurse Practitioners) who all work together to provide you with the care you need, when you need it.  We recommend signing up for the patient portal called "MyChart".  Sign up information is provided on this After Visit Summary.  MyChart is used to connect with patients for Virtual Visits (Telemedicine).  Patients are able to view lab/test results, encounter notes, upcoming appointments, etc.  Non-urgent messages can be sent to your provider as well.   To learn more about what you can do with MyChart, go to [email or web address].    Your next appointment:   6 month(s)  Provider:   Dr. Jacques Navy     Other Instructions  Your physician wants you to follow-up in: 6 months with Dr. Carolann Littler will receive a reminder letter in the mail two months in advance. If you  don't receive a letter, please call our office to schedule the follow-up appointment.

## 2023-03-29 NOTE — Progress Notes (Signed)
   03/29/2023  Patient ID: Tanya Hogan, female   DOB: 1951-05-05, 72 y.o.   MRN: [id number]  Patient outreach to see if [facility] application of patient's phone is now working properly, and offer to try to assist in person if not.  Message sent via MyChart.  Lenna Gilford, PharmD, DPLA

## 2023-04-01 DIAGNOSIS — M79672 Pain in left foot: Secondary | ICD-10-CM | POA: Diagnosis not present

## 2023-04-01 DIAGNOSIS — S90852A Superficial foreign body, left foot, initial encounter: Secondary | ICD-10-CM | POA: Diagnosis not present

## 2023-04-01 DIAGNOSIS — L84 Corns and callosities: Secondary | ICD-10-CM | POA: Diagnosis not present

## 2023-04-01 DIAGNOSIS — M79671 Pain in right foot: Secondary | ICD-10-CM | POA: Diagnosis not present

## 2023-04-01 DIAGNOSIS — G4733 Obstructive sleep apnea (adult) (pediatric): Secondary | ICD-10-CM | POA: Diagnosis not present

## 2023-04-01 DIAGNOSIS — E119 Type 2 diabetes mellitus without complications: Secondary | ICD-10-CM | POA: Diagnosis not present

## 2023-04-01 DIAGNOSIS — L602 Onychogryphosis: Secondary | ICD-10-CM | POA: Diagnosis not present

## 2023-04-01 DIAGNOSIS — G4737 Central sleep apnea in conditions classified elsewhere: Secondary | ICD-10-CM | POA: Diagnosis not present

## 2023-04-03 DIAGNOSIS — G4733 Obstructive sleep apnea (adult) (pediatric): Secondary | ICD-10-CM | POA: Diagnosis not present

## 2023-04-05 ENCOUNTER — Telehealth: Payer: Self-pay | Admitting: Nurse Practitioner

## 2023-04-05 NOTE — Telephone Encounter (Signed)
Spoke with patient, she states she feels like "everything is in slow motion." She reports feeling tired and weak in arms/legs. States she is unable to stand up and complete any activities. Sitting down and resting used to help, but no longer does.  She is on a 50oz/day fluid restriction, and follows this. Weight is stable at 213-215 lbs. She reports SOB "all the time." She also states she feels like she has taken a tranquilizer but has not.  BP/HR during call 111/80, 83. O2 sat 97%. Blood sugar 138 today.  She states she discussed this with Eligha Bridegroom, NP at last office visit on 03/29/23 and symptoms have gotten worse since that visit.  Informed patient I will send message to Eligha Bridegroom, NP to review and advise on any recommendations.   Instructed patient to go to ED if symptoms worsen or do not improve before hearing from our office. She states when she goes to ED they do not know how to treat her.

## 2023-04-05 NOTE — Telephone Encounter (Signed)
I'm sorry that she is feeling so poorly. When I saw her, she told me she was feeling the best she had felt in a long time. We did not change any medications. Her lab work was stable with the exception of mildly elevated liver enzymes.   I feel like she would be a good candidate for referral back to Heart Failure Clinic. She also has upcoming device check with Dr. Ladona Ridgel which will be helpful to make certain this is not related to device function. Please seek appointment with HF clinic if she is agreeable.

## 2023-04-05 NOTE — Telephone Encounter (Signed)
Patient states she has barely been able to move recently. She states she spoke with Swinyer, NP, regarding this during her last appointment but it has gotten worse sense then. Patient denies having any pain. Patient denies brain fog. Patient states she has just been moving very slow and tired.

## 2023-04-06 ENCOUNTER — Telehealth: Payer: Self-pay

## 2023-04-06 NOTE — Progress Notes (Unsigned)
   04/06/2023  Patient ID: Tanya Hogan, female   DOB: [birth year], 72 y.o.   MRN: [id number]  [name] is calling to inform me that she was recently able to purchase a Freestyle Libre 3 reader from [employer] for approximately $60 not being billed to insurance but with a coupon they were able to provide.  Her phone app still is not working properly with the sensors, and she believes a phone update may be needed.  Patient's readings were previously available to view through [facility] dashboard, but I am not able to see these at this time.  She will likely need to grant access through her device.  She continues to receive Libre3 sensors from DME supplier through order placed in [facility].  States FBG is currently ranging 117-140, and she has only had 1 instance of hypoglycemia in the past 4 weeks.  Patient endorses that her brother, Reita Cliche Earnie Larsson), has an appointment at [facility] 8/28 at 11:10am.  I will have [name] follow-up with [name] to assist in linking [place] readings to [facility] upon her return 8/26.  She can either have telephone visit or potentially see her before/after Bobby's appointment 8/28.  Lenna Gilford, PharmD, DPLA

## 2023-04-06 NOTE — Telephone Encounter (Signed)
Called patient to discuss M. Swinyer's recommendation to refer back to CHF clinic. No answer, left detailed message per DPR asking patient to call us and let us know if she would be willing to be seen in CHF clinic.

## 2023-04-06 NOTE — Telephone Encounter (Signed)
Patient calling back to discuss M. Swinyer's recommendation to refer back to CHF clinic. Patient refuses referral at this time. She states she is feeling much better since yesterday as she urinated a lot and then was able to sleep through the night. She states she would rather follow up with Dr. Ladona Ridgel at upcoming visit and discuss with him about whether she needs to return to the CHF clinic.

## 2023-04-09 ENCOUNTER — Telehealth: Payer: Self-pay

## 2023-04-09 DIAGNOSIS — M5030 Other cervical disc degeneration, unspecified cervical region: Secondary | ICD-10-CM

## 2023-04-09 DIAGNOSIS — D649 Anemia, unspecified: Secondary | ICD-10-CM | POA: Diagnosis not present

## 2023-04-09 DIAGNOSIS — G4733 Obstructive sleep apnea (adult) (pediatric): Secondary | ICD-10-CM | POA: Diagnosis not present

## 2023-04-09 DIAGNOSIS — E039 Hypothyroidism, unspecified: Secondary | ICD-10-CM | POA: Diagnosis not present

## 2023-04-09 DIAGNOSIS — N6323 Unspecified lump in the left breast, lower outer quadrant: Secondary | ICD-10-CM | POA: Diagnosis not present

## 2023-04-09 DIAGNOSIS — E119 Type 2 diabetes mellitus without complications: Secondary | ICD-10-CM | POA: Diagnosis not present

## 2023-04-09 DIAGNOSIS — M19072 Primary osteoarthritis, left ankle and foot: Secondary | ICD-10-CM

## 2023-04-09 DIAGNOSIS — M1712 Unilateral primary osteoarthritis, left knee: Secondary | ICD-10-CM

## 2023-04-09 DIAGNOSIS — Z1231 Encounter for screening mammogram for malignant neoplasm of breast: Secondary | ICD-10-CM | POA: Diagnosis not present

## 2023-04-09 DIAGNOSIS — D0511 Intraductal carcinoma in situ of right breast: Secondary | ICD-10-CM | POA: Diagnosis not present

## 2023-04-09 DIAGNOSIS — Z950 Presence of cardiac pacemaker: Secondary | ICD-10-CM | POA: Diagnosis not present

## 2023-04-09 DIAGNOSIS — M8000XA Age-related osteoporosis with current pathological fracture, unspecified site, initial encounter for fracture: Secondary | ICD-10-CM | POA: Diagnosis not present

## 2023-04-09 DIAGNOSIS — K7581 Nonalcoholic steatohepatitis (NASH): Secondary | ICD-10-CM | POA: Diagnosis not present

## 2023-04-09 NOTE — Telephone Encounter (Signed)
Per patient - she is not taking this medication and we should not be receiving shipment for this medication. Since the medication cannot be sent back to [employer], it will be placed in the medication fridge for patient samples.

## 2023-04-09 NOTE — Telephone Encounter (Signed)
Order printed

## 2023-04-09 NOTE — Telephone Encounter (Signed)
Patient came into office concerning a prescription that she'd like to get for a Igarashi, patient states her Toso has disappeared and she is currently using her dads, patient has given information about a company [employer] [phone] where she can get the Pigford from, please advise, thanks.

## 2023-04-09 NOTE — Telephone Encounter (Signed)
Forwarding to [employer] as an Financial planner.  [employer] Nordisk PAP shipment for Ozempic 0.25/0.5 mg dose / 5 boxes received this morning. Please contact the patient to come and pick up their order today. Placed in the PAP fridge with patient identifier. Thanks in advance.   NDC: 4259-5638-75 LOT: [id number] EXP: 2024-08-23

## 2023-04-12 ENCOUNTER — Other Ambulatory Visit: Payer: Self-pay | Admitting: Family Medicine

## 2023-04-12 NOTE — Telephone Encounter (Signed)
[employer] phone number [phone]  FAX # [phone]

## 2023-04-12 NOTE — Telephone Encounter (Signed)
[employer] phone number [phone]

## 2023-04-15 ENCOUNTER — Other Ambulatory Visit: Payer: 59

## 2023-04-15 ENCOUNTER — Other Ambulatory Visit: Payer: Self-pay

## 2023-04-15 NOTE — Patient Instructions (Signed)
Visit Information  Thank you for taking time to visit with me today. Please don't hesitate to contact me if I can be of assistance to you before our next scheduled telephone appointment.  Following are the goals we discussed today:   Goals Addressed             This Visit's Progress    RNCM Care Management Expected Outcome:  Monitor, Self-Manage and Reduce Symptoms of Diabetes       Current Barriers:  Knowledge Deficits related to constipation and having to change medications to see if constipation is resolved and blood sugar elevations after medication changes Chronic Disease Management support and education needs related to effective management of DM Lab Results  Component Value Date   HGBA1C 6.6 (A) 02/16/2023     Planned Interventions: Provided education to patient about basic DM disease process. The patient states her blood sugars have been bouncing around since she has not been taking her Ozempic. She is back taking Metformin 500 mg BID. She states that she is monitoring and her blood sugars are going up more after eating but then coming back down. She is more stable with the Ozempic but it causes her to have more GI issues so she is not taking the Ozempic . The patient did have a slightly elevated A1C at her last pcp visit but she feels she is doing well at managing her DM health and well being.  Reviewed medications with patient and discussed importance of medication adherence. The patient is  taking Metformin 500 mg BID. The patient is working with the pharm D as needed for effective management of her medications. She says she is doing better about not missing her medications. Talked to the patient about setting alarms on her phone as a reminder to take her medications but she does not want to do that right now. The patient is not as overwhelmed during this outreach. She feels she is more stable than she has been in a while. Reflective listening and support given. She wants the  provider to know that the [place] 3 sensors are working the best of any and the first sensor stayed on 14 days and the 2nd one 10 and she has the 3rd sensor on. ;        Reviewed prescribed diet with patient heart healthy/ADA. Review and education provided. Education and support given ; Counseled on importance of regular laboratory monitoring as prescribed. Has regular labwork. Labs up to date. Slight trend up of A1C. Discussed plans with patient for ongoing care management follow up and provided patient with direct contact information for care management team;      Provided patient with written educational materials related to hypo and hyperglycemia and importance of correct treatment. Denies any issues with hypo or hyperglycemia. The patient denies any acute highs or lows right now. .;       Reviewed scheduled/upcoming provider appointments including: 05-20-2023 at 120 pm. Reminder given today;         Advised patient, providing education and rationale, to check cbg twice daily and when you have symptoms of low or high blood sugar and record. Currently the patient has a continuous reader. She states that it has been 100 to 324. She states fasting it is 120 to 130.  7 day average has been around 142 when saw the doctor last. Usually after eating it goes up to 200's. Review of goal of fasting is <130 and post prandial of <180.  She has the [employer] 3 and is now working on strips to go in that meter so she will just have one meter to keep up with.  Referral made to pharmacy team for assistance with PAP for DM medications. The patient is working with the pharm D for medication management and support Review of patient status, including review of consultants reports, relevant laboratory and other test results, and medications completed;       Advised patient to discuss changes in DM, questions, or concerns with provider;      Screening for signs and symptoms of depression related to chronic disease state;         Assessed social determinant of health barriers;  The patient has made positive changes in her habits and lifestyle. She feels like she is not as stressed. She says going to the senior center is helping her with her socialization and having a better relationship with her family as well. 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 Talked about self care.  Encouraged the patient to continue doing things she enjoys. Review and updates given. The patients son is currently in the hospital and she is concerned about him but he is doing better. He had to have his appendix out, now he is back in the hospital with problems with his gallbladder. She is going to day and "lay eyes on him". . She is trying to focus on not overdoing activity and pacing her activity. She does not go outside when the temperatures outside are >85.   Symptom Management: Take medications as prescribed   Attend all scheduled provider appointments Call provider office for new concerns or questions  call the Suicide and Crisis Lifeline: 988 call the [place] National Suicide Prevention Lifeline: (779)073-8892 or TTY: 646 082 6968 TTY (380)260-2971) to talk to a trained counselor call 1-800-273-TALK (toll free, 24 hour hotline) if experiencing a Mental Health or Behavioral Health Crisis  check feet daily for cuts, sores or redness enter blood sugar readings and medication or insulin into daily log take the blood sugar log to all doctor visits trim toenails straight across manage portion size keep feet up while sitting wash and dry feet carefully every day wear comfortable, cotton socks wear comfortable, well-fitting shoes  Follow Up Plan: Telephone follow up appointment with care management team member scheduled for: 06-03-2023 at 345 pm       RNCM Care Management Expected Outcome:  Monitor, Self-Manage and Reduce Symptoms of Heart Failure       Current  Barriers:  Knowledge Deficits related to the biventricular pacing system and the generator changes and fully understanding the pacing system and how it works Chronic Disease Management support and education needs related to effective management of HF EF% 20-25 % in May of 2023, 08-29-2022 EF 25 to 30%  Pacemaker generator setting changed in May of 2023. During hospitalization they discussed her upgrading her pacemaker to an ICD- the patient is unsure if she wants to do this Post discharge from the hospital for SVT 08-28-2022 to 09-03-2022, post discharge from the hospital 10-29-2022 to 11-01-2022  Wt Readings from Last 3 Encounters:  03/29/23 217 lb 9.6 oz (98.7 kg)  03/22/23 217 lb 8 oz (98.7 kg)  02/16/23 217 lb (98.4 kg)  Self reported on 03-04-2023 is 214  Planned Interventions: Basic overview and discussion of pathophysiology of Heart Failure reviewed. Patient states her dry weight is  214 and this is good for her. She is weighing daily and her weight today was 217. The patient is taking Bumex 1.5 tablets in the am, 1 tablet in pm. She states currently she is staying at her baseline and feeling better. She has made positive dietary changes that help with hidden sodium. The patient denies any acute changes in her HF or management of her HF> . She says the extra fluid pill has been significant in helping her maintain control of her HF. Education provided.  Provided education on low sodium diet/ADA. Education and review. The patient is compliant with dietary restrictions. She has had issues in the past with hyperkalemia and she is mindful of this. Education on monitoring sodium content of foods closely.  She has basically cut out eating cheese  She even has stopped putting cheese in her salads. Praised the patient for positive changes. She states sometimes she does backslide and sometimes she does not know the content of sodium in foods, but she is monitoring closer.  Reviewed Heart Failure Action Plan in  depth and provided written copy.Review with the patient the plan of care for changes in her weight. She has got her fluid pretty much balanced and also her bowels are moving more effectively now. Assessed need for readable accurate scales in home. She is weighing daily and she is monitoring closely for changes. She is in close contact with the HF clinic and has the number to call them.   Provided education about placing scale on hard, flat surface Advised patient to weigh each morning after emptying bladder Discussed importance of daily weight and advised patient to weigh and record daily. The patient is compliant with weighing at this time.  She has to sleep with 3 pillows and she easily gets short of breath but states her oxygen stays up. She has a new sims card for her bipap machine and is using it now at night.  Reviewed role of diuretics in prevention of fluid overload and management of heart failure. The patient has parameters and is taking her Bumex as instructed.The patient is in much better spirits today and feels a lot better.  Discussed the importance of keeping all appointments with provider. 05-20-2023 with the pcp, reminder given today. Sees cardiologist regularly.  Provided patient with education about the role of exercise in the management of heart failure. The patient is going to the senior center 3 days a week and doing exercises there. She likes this better than what she was doing at the [facility]. She works out about 45 minutes each time she goes. She is also doing mahjong, a game she had been wanting to learn to play. She says the socialization and activity have been a positive thing for her. She is optimistic and taking it "one day at a time". The patient wants to do what she can to take care of herself and her chronic conditions. She is thankful for new things she is doing and the support of her family as well. Advised patient to discuss changes in HF or other  with provider Screening for  signs and symptoms of depression related to chronic disease state  Assessed social determinant of health barriers  Symptom Management: Take medications as prescribed   Attend all scheduled provider appointments Call provider office for new concerns or questions  call the Suicide and Crisis Lifeline: 988 call the [place] National Suicide Prevention Lifeline: 8317989831 or TTY: (773) 221-6064 TTY (734)385-7558) to talk to a trained counselor call 1-800-273-TALK (  toll free, 24 hour hotline) if experiencing a Mental Health or Behavioral Health Crisis  call office if I gain more than 2 pounds in one day or 5 pounds in one week keep legs up while sitting track weight in diary use salt in moderation watch for swelling in feet, ankles and legs every day weigh myself daily begin a heart failure diary bring diary to all appointments develop a rescue plan follow rescue plan if symptoms flare-up track symptoms and what helps feel better or worse dress right for the weather, hot or cold  Follow Up Plan: Telephone follow up appointment with care management team member scheduled for: 06-03-2023 at 345 pm           Our next appointment is by telephone on 06-03-2023 at 345 pm  Please call the care guide team at [phone] if you need to cancel or reschedule your appointment.   If you are experiencing a Mental Health or Behavioral Health Crisis or need someone to talk to, please call the Suicide and Crisis Lifeline: 988 call the [place] National Suicide Prevention Lifeline: (903)859-5174 or TTY: (819) 885-1999 TTY 615-218-8009) to talk to a trained counselor call 1-800-273-TALK (toll free, 24 hour hotline) go to [facility] [address] [place] [phone])   Patient verbalizes understanding of instructions and care plan provided today and agrees to view in MyChart. Active MyChart status and patient understanding of how to access instructions and care  plan via MyChart confirmed with patient.     Telephone follow up appointment with care management team member scheduled for: 06-03-2023 at 345 pm  Alto Denver RN, MSN, CCM RN Care Manager  [facility]  Ambulatory Care Management  Direct Number: [phone]

## 2023-04-15 NOTE — Patient Outreach (Signed)
Care Management   Visit Note  04/15/2023 Name: Tanya Hogan MRN: [id number] DOB: 09-26-1950  Subjective: Tanya Hogan is a 72 y.o. year old female who is a primary care patient of [name], MD. The Care Management team was consulted for assistance.      Engaged with patient spoke with patient by telephone.    Goals Addressed             This Visit's Progress    RNCM Care Management Expected Outcome:  Monitor, Self-Manage and Reduce Symptoms of Diabetes       Current Barriers:  Knowledge Deficits related to constipation and having to change medications to see if constipation is resolved and blood sugar elevations after medication changes Chronic Disease Management support and education needs related to effective management of DM Lab Results  Component Value Date   HGBA1C 6.6 (A) 02/16/2023     Planned Interventions: Provided education to patient about basic DM disease process. The patient states her blood sugars have been bouncing around since she has not been taking her Ozempic. She is back taking Metformin 500 mg BID. She states that she is monitoring and her blood sugars are going up more after eating but then coming back down. She is more stable with the Ozempic but it causes her to have more GI issues so she is not taking the Ozempic . The patient did have a slightly elevated A1C at her last pcp visit but she feels she is doing well at managing her DM health and well being.  Reviewed medications with patient and discussed importance of medication adherence. The patient is  taking Metformin 500 mg BID. The patient is working with the pharm D as needed for effective management of her medications. She says she is doing better about not missing her medications. Talked to the patient about setting alarms on her phone as a reminder to take her medications but she does not want to do that right now. The patient is not as overwhelmed during this outreach. She feels she is  more stable than she has been in a while. Reflective listening and support given. She wants the provider to know that the [place] 3 sensors are working the best of any and the first sensor stayed on 14 days and the 2nd one 10 and she has the 3rd sensor on. ;        Reviewed prescribed diet with patient heart healthy/ADA. Review and education provided. Education and support given ; Counseled on importance of regular laboratory monitoring as prescribed. Has regular labwork. Labs up to date. Slight trend up of A1C. Discussed plans with patient for ongoing care management follow up and provided patient with direct contact information for care management team;      Provided patient with written educational materials related to hypo and hyperglycemia and importance of correct treatment. Denies any issues with hypo or hyperglycemia. The patient denies any acute highs or lows right now. .;       Reviewed scheduled/upcoming provider appointments including: 05-20-2023 at 120 pm. Reminder given today;         Advised patient, providing education and rationale, to check cbg twice daily and when you have symptoms of low or high blood sugar and record. Currently the patient has a continuous reader. She states that it has been 100 to 324. She states fasting it is 120 to 130.  7 day average has been around 142 when saw the doctor last. Usually after  eating it goes up to 200's. Review of goal of fasting is <130 and post prandial of <180.   She has the [employer] 3 and is now working on strips to go in that meter so she will just have one meter to keep up with.  Referral made to pharmacy team for assistance with PAP for DM medications. The patient is working with the pharm D for medication management and support Review of patient status, including review of consultants reports, relevant laboratory and other test results, and medications completed;       Advised patient to discuss changes in DM, questions, or concerns with  provider;      Screening for signs and symptoms of depression related to chronic disease state;        Assessed social determinant of health barriers;  The patient has made positive changes in her habits and lifestyle. She feels like she is not as stressed. She says going to the senior center is helping her with her socialization and having a better relationship with her family as well. 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 Talked about self care.  Encouraged the patient to continue doing things she enjoys. Review and updates given. The patients son is currently in the hospital and she is concerned about him but he is doing better. He had to have his appendix out, now he is back in the hospital with problems with his gallbladder. She is going to day and "lay eyes on him". . She is trying to focus on not overdoing activity and pacing her activity. She does not go outside when the temperatures outside are >85.   Symptom Management: Take medications as prescribed   Attend all scheduled provider appointments Call provider office for new concerns or questions  call the Suicide and Crisis Lifeline: 988 call the [place] National Suicide Prevention Lifeline: 760-836-8079 or TTY: 807 087 7304 TTY [phone]) to talk to a trained counselor call 1-800-273-TALK (toll free, 24 hour hotline) if experiencing a Mental Health or Behavioral Health Crisis  check feet daily for cuts, sores or redness enter blood sugar readings and medication or insulin into daily log take the blood sugar log to all doctor visits trim toenails straight across manage portion size keep feet up while sitting wash and dry feet carefully every day wear comfortable, cotton socks wear comfortable, well-fitting shoes  Follow Up Plan: Telephone follow up appointment with care management team member scheduled for: 06-03-2023 at 345 pm       RNCM Care  Management Expected Outcome:  Monitor, Self-Manage and Reduce Symptoms of Heart Failure       Current Barriers:  Knowledge Deficits related to the biventricular pacing system and the generator changes and fully understanding the pacing system and how it works Chronic Disease Management support and education needs related to effective management of HF EF% 20-25 % in May of 2023, 08-29-2022 EF 25 to 30%  Pacemaker generator setting changed in May of 2023. During hospitalization they discussed her upgrading her pacemaker to an ICD- the patient is unsure if she wants to do this Post discharge from the hospital for SVT 08-28-2022 to 09-03-2022, post discharge from the hospital 10-29-2022 to 11-01-2022  Wt Readings from Last 3 Encounters:  03/29/23 217 lb 9.6 oz (98.7 kg)  03/22/23 217 lb 8 oz (98.7 kg)  02/16/23 217 lb (98.4 kg)  Self reported on 03-04-2023 is  214  Planned Interventions: Basic overview and discussion of pathophysiology of Heart Failure reviewed. Patient states her dry weight is 214 and this is good for her. She is weighing daily and her weight today was 217. The patient is taking Bumex 1.5 tablets in the am, 1 tablet in pm. She states currently she is staying at her baseline and feeling better. She has made positive dietary changes that help with hidden sodium. The patient denies any acute changes in her HF or management of her HF> . She says the extra fluid pill has been significant in helping her maintain control of her HF. Education provided.  Provided education on low sodium diet/ADA. Education and review. The patient is compliant with dietary restrictions. She has had issues in the past with hyperkalemia and she is mindful of this. Education on monitoring sodium content of foods closely.  She has basically cut out eating cheese  She even has stopped putting cheese in her salads. Praised the patient for positive changes. She states sometimes she does backslide and sometimes she does not know  the content of sodium in foods, but she is monitoring closer.  Reviewed Heart Failure Action Plan in depth and provided written copy.Review with the patient the plan of care for changes in her weight. She has got her fluid pretty much balanced and also her bowels are moving more effectively now. Assessed need for readable accurate scales in home. She is weighing daily and she is monitoring closely for changes. She is in close contact with the HF clinic and has the number to call them.   Provided education about placing scale on hard, flat surface Advised patient to weigh each morning after emptying bladder Discussed importance of daily weight and advised patient to weigh and record daily. The patient is compliant with weighing at this time.  She has to sleep with 3 pillows and she easily gets short of breath but states her oxygen stays up. She has a new sims card for her bipap machine and is using it now at night.  Reviewed role of diuretics in prevention of fluid overload and management of heart failure. The patient has parameters and is taking her Bumex as instructed.The patient is in much better spirits today and feels a lot better.  Discussed the importance of keeping all appointments with provider. 05-20-2023 with the pcp, reminder given today. Sees cardiologist regularly.  Provided patient with education about the role of exercise in the management of heart failure. The patient is going to the senior center 3 days a week and doing exercises there. She likes this better than what she was doing at the [facility]. She works out about 45 minutes each time she goes. She is also doing mahjong, a game she had been wanting to learn to play. She says the socialization and activity have been a positive thing for her. She is optimistic and taking it "one day at a time". The patient wants to do what she can to take care of herself and her chronic conditions. She is thankful for new things she is doing and the support of  her family as well. Advised patient to discuss changes in HF or other  with provider Screening for signs and symptoms of depression related to chronic disease state  Assessed social determinant of health barriers  Symptom Management: Take medications as prescribed   Attend all scheduled provider appointments Call provider office for new concerns or questions  call the Suicide and Crisis Lifeline: 988 call  the [place] National Suicide Prevention Lifeline: 334-753-5442 or TTY: 386-328-9690 TTY 607-325-8420) to talk to a trained counselor call 1-800-273-TALK (toll free, 24 hour hotline) if experiencing a Mental Health or Behavioral Health Crisis  call office if I gain more than 2 pounds in one day or 5 pounds in one week keep legs up while sitting track weight in diary use salt in moderation watch for swelling in feet, ankles and legs every day weigh myself daily begin a heart failure diary bring diary to all appointments develop a rescue plan follow rescue plan if symptoms flare-up track symptoms and what helps feel better or worse dress right for the weather, hot or cold  Follow Up Plan: Telephone follow up appointment with care management team member scheduled for: 06-03-2023 at 345 pm           Consent to Services:  Patient was given information about care management services, agreed to services, and gave verbal consent to participate.   Plan: Telephone follow up appointment with care management team member scheduled for: 06-03-2023 at 345 pm  Alto Denver RN, MSN, CCM RN Care Manager  [facility]  Ambulatory Care Management  Direct Number: [phone]

## 2023-04-19 ENCOUNTER — Encounter: Payer: Self-pay | Admitting: Internal Medicine

## 2023-04-19 ENCOUNTER — Ambulatory Visit: Payer: 59 | Attending: Internal Medicine | Admitting: Internal Medicine

## 2023-04-19 VITALS — BP 180/90 | HR 73 | Ht 61.0 in | Wt 217.0 lb

## 2023-04-19 DIAGNOSIS — I4729 Other ventricular tachycardia: Secondary | ICD-10-CM

## 2023-04-19 LAB — CUP PACEART INCLINIC DEVICE CHECK
Date Time Interrogation Session: 20240826165807
Implantable Lead Connection Status: 753985
Implantable Lead Connection Status: 753985
Implantable Lead Connection Status: 753985
Implantable Lead Implant Date: 20030324
Implantable Lead Implant Date: 20030324
Implantable Lead Implant Date: 20230313
Implantable Lead Location: 753858
Implantable Lead Location: 753859
Implantable Lead Location: 753860
Implantable Lead Model: 3830
Implantable Lead Model: 4068
Implantable Lead Model: 4193
Implantable Pulse Generator Implant Date: 20230313

## 2023-04-19 NOTE — Patient Instructions (Addendum)
Medication Instructions:  Your physician recommends that you continue on your current medications as directed. Please refer to the Current Medication list given to you today.  *If you need a refill on your cardiac medications before your next appointment, please call your pharmacy*  Lab Work: None ordered.  If you have labs (blood work) drawn today and your tests are completely normal, you will receive your results only by: MyChart Message (if you have MyChart) OR A paper copy in the mail If you have any lab test that is abnormal or we need to change your treatment, we will call you to review the results.  Testing/Procedures: None ordered.  Follow-Up: At [facility], you and your health needs are our priority.  As part of our continuing mission to provide you with exceptional heart care, we have created designated Provider Care Teams.  These Care Teams include your primary Cardiologist (physician) and Advanced Practice Providers (APPs -  Physician Assistants and Nurse Practitioners) who all work together to provide you with the care you need, when you need it.   Your next appointment:   1 year(s) 6 month General Cardiology  The format for your next appointment:   In Person  Provider:   Lewayne Bunting, MD{or one of the following Advanced Practice Providers on your designated Care Team:   Francis Dowse, [place] Casimiro Needle "Mardelle Matte" [place], [place] Earnest Rosier, NP   Important Information About Sugar

## 2023-04-19 NOTE — Progress Notes (Signed)
HPI Tanya Hogan returns today for followup. She is a pleasant  72 yo woman with chronic systolic heart failure and CHB, s/p Biv PPM insertion. She has had some increased thresholds and we upgraded her to a left bundle area lead in the RV port in conjunction with her old RA and LV leads.She has breast CA. She is s/p surgery. She has also had a breast resection.No syncope. Her dyspnea is improved.  Allergies  Allergen Reactions   Injectafer [Ferric Carboxymaltose] Shortness Of Breath    Mild SOB following injectafer infusion Mild SOB following injectafer infusion    Iodinated Contrast Media Itching and Rash    Pt states she has had ultrasound with dye but was fine  Pt denies   Penicillins Anaphylaxis and Rash    Has patient had a PCN reaction causing immediate rash, facial/tongue/throat swelling, SOB or lightheadedness with hypotension: Yes Has patient had a PCN reaction causing severe rash involving mucus membranes or skin necrosis: No Has patient had a PCN reaction that required hospitalization: No Has patient had a PCN reaction occurring within the last 10 years: No If all of the above answers are "NO", then may proceed with Cephalosporin use. "Stuttered with medication"     Accupril [Quinapril Hcl] Other (See Comments)    Hyperkalemia=high potassium level   Aleve [Naproxen] Swelling   Basle Rash   Celebrex [Celecoxib]     ulcers   Cozaar [Losartan Potassium] Itching   Flonase [Fluticasone] Other (See Comments)    Nasal ulcer.     Lactose Intolerance (Gi) Other (See Comments)    Increased calcium, sneezing, nasal issues   Levomenol Other (See Comments)    unknown unknown    Lipitor [Atorvastatin] Other (See Comments)    muscle aches/pains   Nasonex [Mometasone Furoate] Other (See Comments)    nosebleed   Xyzal [Levocetirizine] Other (See Comments)    Doesn't recall reaction type   Bentyl [Dicyclomine]    Chamomile Other (See Comments)    unknown   Entresto  [Sacubitril-Valsartan]     Hyperkalemia   Vesicare [Solifenacin] Other (See Comments)    "stopped me from urinating"   Aspirin Other (See Comments)    Pt states "burns holes in stomach", ulcers    Baking Soda-Fluoride [Sodium Fluoride] Itching and Rash   Clindamycin/Lincomycin Rash   Codeine Nausea And Vomiting   Furosemide Other (See Comments)    Nose bleed   Lactose Other (See Comments)   Lanolin-Petrolatum Rash   Miralax [Polyethylene Glycol] Rash   Quinine Rash   Reclast [Zoledronic Acid] Rash and Other (See Comments)    shaking   Rifampin Other (See Comments)    DOES NOT REMEMBER THIS MEDICATION OR ALLERGY   Sulfa Antibiotics Rash   Sulfonamide Derivatives Rash   Tramadol Rash    Anxiety   Wellbutrin [Bupropion] Other (See Comments)    Muscle aches, pain, "made my skin crawl"   Wool Alcohol [Lanolin] Rash     Current Outpatient Medications  Medication Sig Dispense Refill   albuterol (PROVENTIL) (2.5 MG/3ML) 0.083% nebulizer solution Take 2.5 mg by nebulization every 6 (six) hours as needed for wheezing or shortness of breath.     albuterol (VENTOLIN HFA) 108 (90 Base) MCG/ACT inhaler Inhale 1-2 puffs into the lungs every 6 (six) hours as needed for wheezing or shortness of breath. 54 g 3   alendronate (FOSAMAX) 70 MG tablet TAKE 1 TABLET BY MOUTH  EVERY 7 DAYS WITH A FULL  GLASS OF WATER ON AN EMPTY  STOMACH 12 tablet 3   AMBULATORY NON FORMULARY MEDICATION Medication Name: Tubing and supplies for nebulizer. Fax - [phone] 1 vial 0   amiodarone (PACERONE) 200 MG tablet TAKE  200 MG ONCE A DAY MONDAY THRU THURSDAY  TAKE  200 MG TWICE A DAY FRIDAY THROUGH SUNDAY 120 tablet 2   aspirin 81 MG chewable tablet Chew 81 mg by mouth daily.     bisacodyl 5 MG EC tablet Take 5 mg by mouth 2 (two) times daily.     bumetanide (BUMEX) 2 MG tablet Take 1 tablet (2 mg total) by mouth 2 (two) times daily. 180 tablet 3   Continuous Glucose Receiver (FREESTYLE LIBRE 3 READER) DEVI Use  for continuous glucose monitoring 1 each 0   Continuous Glucose Sensor (FREESTYLE LIBRE 3 SENSOR) MISC by Does not apply route. Change every 14 days     Docusate Sodium (COLACE PO) Take by mouth. 1 per day     fluticasone-salmeterol (ADVAIR HFA) 115-21 MCG/ACT inhaler Inhale 2 puffs into the lungs 2 (two) times daily.     glucose blood test strip To be used to check blood glucose Dx:E11.8 FreeStyle Precision blood glucose for Freestyle Libre 2 100 each 12   levothyroxine (SYNTHROID) 88 MCG tablet TAKE 1 TABLET BY MOUTH DAILY  BEFORE BREAKFAST 90 tablet 3   LINZESS 290 MCG CAPS capsule Take 290 mcg by mouth daily at 2 am.     metFORMIN (GLUCOPHAGE-XR) 500 MG 24 hr tablet Take 1 tablet (500 mg total) by mouth 2 (two) times daily with a meal. 180 tablet 1   Multiple Vitamins-Minerals (OCUVITE EYE + MULTI) TABS Take 1 tablet by mouth in the morning.     mupirocin cream (BACTROBAN) 2 % Apply 1 Application topically 2 (two) times daily. 15 g 0   nystatin cream (MYCOSTATIN) Apply 1 Application topically 2 (two) times daily as needed (rash). 30 g 3   Omega-3 Fatty Acids (FISH OIL) 1000 MG CAPS Take 1,000 mg by mouth daily.     pantoprazole (PROTONIX) 40 MG tablet TAKE 1 TABLET BY MOUTH DAILY  BEFORE BREAKFAST 90 tablet 3   pravastatin (PRAVACHOL) 40 MG tablet Take 1 tablet (40 mg total) by mouth every evening. 90 tablet 3   Tiotropium Bromide Monohydrate (SPIRIVA RESPIMAT) 1.25 MCG/ACT AERS Inhale 2 puffs into the lungs daily.     triamcinolone cream (KENALOG) 0.1 % Apply 1 Application topically 2 (two) times daily. 30 g 0   VITAMIN D PO Take 1 tablet by mouth daily.     No current facility-administered medications for this visit.     Past Medical History:  Diagnosis Date   Alkaline phosphatase elevation    Asthma    Breast cancer (HCC) 2011   Carpal tunnel syndrome    bilateral   Chronic HFrEF (heart failure with reduced ejection fraction) (HCC)    Cirrhosis, non-alcoholic (HCC)    Closed  fracture of unspecified part of radius (alone)    DDD (degenerative disc disease), lumbar    Depression    pt denies   Diabetes mellitus without complication (HCC)    Enlarged heart    Fibromyalgia    GERD (gastroesophageal reflux disease)    History of kidney stones    HTN (hypertension)    Hypothyroidism    IBS (irritable bowel syndrome)    LBBB (left bundle branch block)    Microcytic anemia 05/14/2017   Mitral regurgitation    NICM (  nonischemic cardiomyopathy) (HCC)    Pneumonia    Presence of permanent cardiac pacemaker    Pulmonary hypertension (HCC)    PVD (peripheral vascular disease) (HCC)    2019   Sleep apnea    uses cpap   Tricuspid regurgitation    Ventricular tachyarrhythmia (HCC)     ROS:   All systems reviewed and negative except as noted in the HPI.   Past Surgical History:  Procedure Laterality Date   BIV PACEMAKER GENERATOR CHANGEOUT N/A 11/03/2021   Procedure: BIV PACEMAKER GENERATOR CHANGEOUT;  Surgeon: Marinus Maw, MD;  Location: [facility] INVASIVE CV LAB;  Service: Cardiovascular;  Laterality: N/A;   BREAST LUMPECTOMY Left 2011   COLONOSCOPY     FOOT SURGERY     KNEE SURGERY     LEAD REVISION/REPAIR N/A 11/03/2021   Procedure: LEAD REVISION/REPAIR;  Surgeon: Marinus Maw, MD;  Location: [facility]AS[facility] CV [facility];  Service: Cardiovascular;  Laterality: N/A;   MASTECTOMY Right    05/2019   PACEMAKER GENERATOR CHANGE N/A 08/23/2014   Procedure: PACEMAKER GENERATOR CHANGE;  Surgeon: Marinus Maw, MD;  Location: [facility] C[facility] LAB;  Service: Cardiovascular;  Laterality: N/A;   PACEMAKER PLACEMENT  2009   [facility]   RECTAL SURGERY  1976   REDUCTION MAMMAPLASTY Left    05/2019   TOTAL SHOULDER ARTHROPLASTY Right 03/10/2018   TOTAL SHOULDER ARTHROPLASTY Right 03/10/2018   Procedure: RIGHT TOTAL SHOULDER ARTHROPLASTY;  Surgeon: Jones Broom, MD;  Location: [facility] OR;  Service: Orthopedics;  Laterality: Right;   TUBAL LIGATION       Family History   Problem Relation Age of Onset   Heart disease Mother    Melanoma Mother    Parkinson's disease Mother    Heart disease Father    Heart disease Brother    Heart failure Other    Breast cancer Other    Hypertension Other      Social History   Socioeconomic History   Marital status: Divorced    Spouse name: Not on file   Number of children: 2   Years of education: 14   Highest education level: Associate degree: academic program  Occupational History   Occupation: quality insurcance in Sports coach    Comment: retired  Tobacco Use   Smoking status: Former    Current packs/day: 0.00    Types: Cigarettes    Quit date: 08/24/1966    Years since quitting: 56.6   Smokeless tobacco: Never  Vaping Use   Vaping status: Never Used  Substance and Sexual Activity   Alcohol use: No   Drug use: No   Sexual activity: Not Currently  Other Topics Concern   Not on file  Social History Narrative   Retired. Lives with and helps her brother. Likes Risk manager, Nutritional therapist and music. Loves animals.   Social Determinants of Health   Financial Resource Strain: Low Risk  (04/09/2023)   Received from [facility]   Overall Financial Resource Strain (CARDIA)    Difficulty of Paying Living Expenses: Not hard at all  Food Insecurity: No Food Insecurity (04/09/2023)   Received from [facility]   Hunger Vital Sign    Worried About Running Out of Food in the Last Year: Never true    Ran Out of Food in the Last Year: Never true  Transportation Needs: No Transportation Needs (04/09/2023)   Received from [facility] - Transportation    Lack of Transportation (Medical): No  Lack of Transportation (Non-Medical): No  Physical Activity: Sufficiently Active (08/06/2022)   Exercise Vital Sign    Days of Exercise per Week: 5 days    Minutes of Exercise per Session: 60 min  Stress: No Stress Concern Present (08/29/2022)   Received from [facility] - Occupational Stress Questionnaire    Feeling of Stress : Not at all  Social Connections: Moderately Integrated (08/06/2022)   Social Connection and Isolation Panel [NHANES]    Frequency of Communication with Friends and Family: More than three times a week    Frequency of Social Gatherings with Friends and Family: Three times a week    Attends Religious Services: More than 4 times per year    Active Member of Clubs or Organizations: Yes    Attends Banker Meetings: More than 4 times per year    Marital Status: Divorced  Intimate Partner Violence: Not At Risk (10/29/2022)   Humiliation, Afraid, Rape, and Kick questionnaire    Fear of Current or Ex-Partner: No    Emotionally Abused: No    Physically Abused: No    Sexually Abused: No     BP (!) 180/90   Pulse 73   Ht 5\' 1"  (1.549 m)   Wt 217 lb (98.4 kg)   SpO2 97%   BMI 41.00 kg/m   Physical Exam:  Well appearing NAD HEENT: Unremarkable Neck:  No JVD, no thyromegally Lymphatics:  No adenopathy Back:  No CVA tenderness Lungs:  Clear HEART:  Regular rate rhythm, no murmurs, no rubs, no clicks Abd:  soft, positive bowel sounds, no organomegally, no rebound, no guarding Ext:  2 plus pulses, no edema, no cyanosis, no clubbing Skin:  No rashes no nodules Neuro:  CN II through XII intact, motor grossly intact  EKG  DEVICE  Normal device function.  See PaceArt for details.   Assess/Plan:  Chronic systolic heart failure - her symptoms are improved after placement of a left bundle area lead. She will undergo watchful waiting.   Biv PPM - her device is working normally and she has almost 7 years of battery longevity.  3.   Obesity her bmi is 37. She will need to work    on     this. She is taking ozempic. 4. PVC's - these are stable as she is v. Paced 99%.    Tanya Gowda Peregrine Nolt,MD

## 2023-04-20 ENCOUNTER — Telehealth (HOSPITAL_COMMUNITY): Payer: Self-pay | Admitting: Surgery

## 2023-04-20 NOTE — Telephone Encounter (Signed)
I attempted to reach patient after being notified that she previously called our clinic and was unable to "reach anyone".  She conveys that she had called the triage line and was forced to leave a message.  She tells me that she was "scared" and needed to make a decision about where to seek care--"the Urgent Care or doctor office".  I explained that it is often necessary to leave a message and await a return call until after clinical staff speak to the provider about the issue.  She explained the she felt she waited to long.  She tells me that she wishes to not return to the [facility] at this time.  I apologized for her experience and acknowledged that she can schedule a follow-up appt at any time.

## 2023-04-21 NOTE — Telephone Encounter (Signed)
Pt was prev followed by Dr Shari Prows and is sch to f/u with Dr Jacques Navy in Jan '25 for gen cards

## 2023-04-22 DIAGNOSIS — D0511 Intraductal carcinoma in situ of right breast: Secondary | ICD-10-CM | POA: Diagnosis not present

## 2023-04-23 ENCOUNTER — Other Ambulatory Visit: Payer: Self-pay

## 2023-04-23 ENCOUNTER — Encounter (HOSPITAL_BASED_OUTPATIENT_CLINIC_OR_DEPARTMENT_OTHER): Payer: Self-pay

## 2023-04-23 ENCOUNTER — Emergency Department (HOSPITAL_BASED_OUTPATIENT_CLINIC_OR_DEPARTMENT_OTHER): Payer: 59

## 2023-04-23 ENCOUNTER — Emergency Department (HOSPITAL_BASED_OUTPATIENT_CLINIC_OR_DEPARTMENT_OTHER)
Admission: EM | Admit: 2023-04-23 | Discharge: 2023-04-23 | Disposition: A | Discharge status: Home / Self Care | Payer: 59 | Attending: Emergency Medicine | Admitting: Emergency Medicine

## 2023-04-23 DIAGNOSIS — R002 Palpitations: Secondary | ICD-10-CM | POA: Insufficient documentation

## 2023-04-23 DIAGNOSIS — Z950 Presence of cardiac pacemaker: Secondary | ICD-10-CM | POA: Insufficient documentation

## 2023-04-23 DIAGNOSIS — J984 Other disorders of lung: Secondary | ICD-10-CM | POA: Diagnosis not present

## 2023-04-23 DIAGNOSIS — Z7984 Long term (current) use of oral hypoglycemic drugs: Secondary | ICD-10-CM | POA: Diagnosis not present

## 2023-04-23 DIAGNOSIS — Z853 Personal history of malignant neoplasm of breast: Secondary | ICD-10-CM | POA: Diagnosis not present

## 2023-04-23 DIAGNOSIS — E119 Type 2 diabetes mellitus without complications: Secondary | ICD-10-CM | POA: Insufficient documentation

## 2023-04-23 DIAGNOSIS — R160 Hepatomegaly, not elsewhere classified: Secondary | ICD-10-CM | POA: Diagnosis not present

## 2023-04-23 DIAGNOSIS — R0602 Shortness of breath: Secondary | ICD-10-CM | POA: Insufficient documentation

## 2023-04-23 DIAGNOSIS — Z7982 Long term (current) use of aspirin: Secondary | ICD-10-CM | POA: Diagnosis not present

## 2023-04-23 DIAGNOSIS — K5732 Diverticulitis of large intestine without perforation or abscess without bleeding: Secondary | ICD-10-CM | POA: Insufficient documentation

## 2023-04-23 DIAGNOSIS — E039 Hypothyroidism, unspecified: Secondary | ICD-10-CM | POA: Diagnosis not present

## 2023-04-23 DIAGNOSIS — Z20822 Contact with and (suspected) exposure to covid-19: Secondary | ICD-10-CM | POA: Diagnosis not present

## 2023-04-23 DIAGNOSIS — R109 Unspecified abdominal pain: Secondary | ICD-10-CM | POA: Diagnosis present

## 2023-04-23 DIAGNOSIS — J45909 Unspecified asthma, uncomplicated: Secondary | ICD-10-CM | POA: Diagnosis not present

## 2023-04-23 DIAGNOSIS — Z79899 Other long term (current) drug therapy: Secondary | ICD-10-CM | POA: Insufficient documentation

## 2023-04-23 DIAGNOSIS — Z96611 Presence of right artificial shoulder joint: Secondary | ICD-10-CM | POA: Diagnosis not present

## 2023-04-23 DIAGNOSIS — K5792 Diverticulitis of intestine, part unspecified, without perforation or abscess without bleeding: Secondary | ICD-10-CM

## 2023-04-23 DIAGNOSIS — K529 Noninfective gastroenteritis and colitis, unspecified: Secondary | ICD-10-CM | POA: Insufficient documentation

## 2023-04-23 DIAGNOSIS — I4891 Unspecified atrial fibrillation: Secondary | ICD-10-CM | POA: Diagnosis not present

## 2023-04-23 DIAGNOSIS — R918 Other nonspecific abnormal finding of lung field: Secondary | ICD-10-CM | POA: Diagnosis not present

## 2023-04-23 DIAGNOSIS — R0600 Dyspnea, unspecified: Secondary | ICD-10-CM | POA: Diagnosis not present

## 2023-04-23 DIAGNOSIS — I5022 Chronic systolic (congestive) heart failure: Secondary | ICD-10-CM | POA: Insufficient documentation

## 2023-04-23 DIAGNOSIS — J929 Pleural plaque without asbestos: Secondary | ICD-10-CM | POA: Diagnosis not present

## 2023-04-23 DIAGNOSIS — Z95810 Presence of automatic (implantable) cardiac defibrillator: Secondary | ICD-10-CM | POA: Diagnosis not present

## 2023-04-23 DIAGNOSIS — K5730 Diverticulosis of large intestine without perforation or abscess without bleeding: Secondary | ICD-10-CM | POA: Diagnosis not present

## 2023-04-23 DIAGNOSIS — R42 Dizziness and giddiness: Secondary | ICD-10-CM | POA: Insufficient documentation

## 2023-04-23 DIAGNOSIS — Z7951 Long term (current) use of inhaled steroids: Secondary | ICD-10-CM | POA: Insufficient documentation

## 2023-04-23 DIAGNOSIS — K8020 Calculus of gallbladder without cholecystitis without obstruction: Secondary | ICD-10-CM | POA: Diagnosis not present

## 2023-04-23 DIAGNOSIS — I110 Hypertensive heart disease with heart failure: Secondary | ICD-10-CM | POA: Insufficient documentation

## 2023-04-23 DIAGNOSIS — K449 Diaphragmatic hernia without obstruction or gangrene: Secondary | ICD-10-CM | POA: Diagnosis not present

## 2023-04-23 LAB — BASIC METABOLIC PANEL
Anion gap: 11 (ref 5–15)
BUN: 18 mg/dL (ref 8–23)
CO2: 28 mmol/L (ref 22–32)
Calcium: 9.3 mg/dL (ref 8.9–10.3)
Chloride: 103 mmol/L (ref 98–111)
Creatinine, Ser: 0.96 mg/dL (ref 0.44–1.00)
GFR, Estimated: >60 mL/min (ref >60)
Glucose, Bld: 99 mg/dL (ref 70–99)
Potassium: 3.5 mmol/L (ref 3.5–5.1)
Sodium: 142 mmol/L (ref 135–145)

## 2023-04-23 LAB — LIPASE, BLOOD: Lipase: 27 U/L (ref 11–51)

## 2023-04-23 LAB — CBC
HCT: 38.8 % (ref 36.0–46.0)
Hemoglobin: 12.8 g/dL (ref 12.0–15.0)
MCH: 31.5 pg (ref 26.0–34.0)
MCHC: 33 g/dL (ref 30.0–36.0)
MCV: 95.6 fL (ref 80.0–100.0)
Platelets: 263 10*3/uL (ref 150–400)
RBC: 4.06 MIL/uL (ref 3.87–5.11)
RDW: 13.1 % (ref 11.5–15.5)
WBC: 8.1 10*3/uL (ref 4.0–10.5)
nRBC: 0 % (ref 0.0–0.2)

## 2023-04-23 LAB — URINALYSIS, W/ REFLEX TO CULTURE (INFECTION SUSPECTED)
Bacteria, UA: NONE SEEN
Bilirubin Urine: NEGATIVE
Glucose, UA: NEGATIVE mg/dL
Hgb urine dipstick: NEGATIVE
Ketones, ur: 15 mg/dL — AB
Leukocytes,Ua: NEGATIVE
Nitrite: NEGATIVE
Protein, ur: 30 mg/dL — AB
RBC / HPF: NONE SEEN RBC/hpf (ref 0–5)
Specific Gravity, Urine: 1.015 (ref 1.005–1.030)
pH: 8.5 — ABNORMAL HIGH (ref 5.0–8.0)

## 2023-04-23 LAB — HEPATIC FUNCTION PANEL
ALT: 56 U/L — ABNORMAL HIGH (ref 0–44)
AST: 56 U/L — ABNORMAL HIGH (ref 15–41)
Albumin: 3.5 g/dL (ref 3.5–5.0)
Alkaline Phosphatase: 121 U/L (ref 38–126)
Bilirubin, Direct: 0.1 mg/dL (ref 0.0–0.2)
Indirect Bilirubin: 0.4 mg/dL (ref 0.3–0.9)
Total Bilirubin: 0.5 mg/dL (ref 0.3–1.2)
Total Protein: 6.8 g/dL (ref 6.5–8.1)

## 2023-04-23 LAB — TROPONIN I (HIGH SENSITIVITY)
Troponin I (High Sensitivity): 26 ng/L — ABNORMAL HIGH (ref <18)
Troponin I (High Sensitivity): 29 ng/L — ABNORMAL HIGH (ref <18)

## 2023-04-23 LAB — BRAIN NATRIURETIC PEPTIDE: B Natriuretic Peptide: 368.4 pg/mL — ABNORMAL HIGH (ref 0.0–100.0)

## 2023-04-23 LAB — SARS CORONAVIRUS 2 BY RT PCR: SARS Coronavirus 2 by RT PCR: NEGATIVE

## 2023-04-23 LAB — PROTIME-INR
INR: 0.9 (ref 0.8–1.2)
Prothrombin Time: 12.8 seconds (ref 11.4–15.2)

## 2023-04-23 MED ORDER — CIPROFLOXACIN HCL 500 MG PO TABS: 500.0000 mg | ORAL_TABLET | Freq: Two times a day (BID) | ORAL | 0 refills | Status: DC

## 2023-04-23 MED ORDER — DIPHENHYDRAMINE HCL 25 MG PO CAPS: 50.0000 mg | ORAL_CAPSULE | Freq: Once | ORAL | Status: AC

## 2023-04-23 MED ORDER — IOHEXOL 350 MG/ML SOLN
100.0000 mL | Freq: Once | INTRAVENOUS | Status: AC | PRN
  Administered 2023-04-23: 100 mL via INTRAVENOUS

## 2023-04-23 MED ORDER — METRONIDAZOLE 500 MG PO TABS: 500.0000 mg | ORAL_TABLET | Freq: Two times a day (BID) | ORAL | 0 refills | Status: DC

## 2023-04-23 MED ORDER — DIPHENHYDRAMINE HCL 50 MG/ML IJ SOLN
50.0000 mg | Freq: Once | INTRAMUSCULAR | Status: AC
  Administered 2023-04-23: 50 mg via INTRAVENOUS
  Filled 2023-04-23: qty 1

## 2023-04-23 MED ORDER — METHYLPREDNISOLONE SODIUM SUCC 40 MG IJ SOLR
40.0000 mg | Freq: Once | INTRAMUSCULAR | Status: AC
  Administered 2023-04-23: 40 mg via INTRAVENOUS
  Filled 2023-04-23: qty 1

## 2023-04-23 NOTE — ED Provider Notes (Signed)
CT angio chest without any evidence of pulmonary embolus.  Maybe a little bit of groundglass appearance.  Patient does not have classic findings suggestive of pneumonia.  And also does not have a leukocytosis.  CT of the abdomen raise some questions of may be a segment of colitis or may be some early diverticulitis.  Patient has lots of allergies.  But seems to be able to handle Cipro and Flagyl.  So we will start her on that just orally.  Do not feel that we need to give it to her IV.  Patient should be stable for discharge home follow-up with her doctors.  From a cardiac standpoint interrogation of her pacemaker had no significant abnormalities or any significant arrhythmias.  Patient's troponins have been relatively stable.  Not worried about any acute cardiac event.  Patient's basic metabolic panel very normal CBC normal BNP up a little bit at 368.  But no evidence of any significant pulmonary edema.  Patient's COVID was negative.   Vanetta Mulders, MD 04/23/23 2212

## 2023-04-23 NOTE — ED Notes (Signed)
Received TC from [employer] at [place].  She relates no events noted on the interrogation of the pacemaker.

## 2023-04-23 NOTE — ED Notes (Signed)
Pt to CT

## 2023-04-23 NOTE — ED Notes (Signed)
ED Provider at bedside. 

## 2023-04-23 NOTE — Discharge Instructions (Addendum)
Workup here CT of the chest did not show any blood clots.  No evidence of any significant pneumonia.  CT of the abdomen raise some questions about may be a little segment of colitis or may be some early diverticulitis.  Would recommend he take the antibiotic Cipro and Flagyl as directed for the next 7 days.  Make an appointment to follow-up with your regular doctor.  Continue all your other current medications.

## 2023-04-23 NOTE — ED Notes (Signed)
UP to bedside chair without assistance.  Place on 1lpm Kimberly for SpO2 89-90.

## 2023-04-23 NOTE — ED Triage Notes (Signed)
The patient has been having intermittent palpitation for two days. She stated they did not get better today. She is also having some ABD distention and a burning in her stomach. She stated she has had a to have fluid in her stomach before. She is having shortness of breath and a cough also with a low grade fever at home.

## 2023-04-23 NOTE — ED Notes (Addendum)
Pt. Called out and expressed frustration with timing of procedures and allergy protocol. We repositioned her and called CT to let them know Pt. Is ready to go. EDP at bedside. Emotional support provided and empathetic listening.

## 2023-04-23 NOTE — ED Provider Notes (Signed)
[facility] Provider Note   CSN: [id number] Arrival date & time: 04/23/23  1117     History {Add pertinent medical, surgical, social history, OB history to HPI:1} Chief Complaint  Patient presents with   Palpitations   Shortness of Breath    Tanya Hogan is a 72 y.o. female.  HPI      72 year old female with chronic systolic heart failure, complete heart block status post pacemaker placement, breast cancer, cirrhosis, diabetes, hypertension, hypothyroidism pacemaker generator change out and lead revision/repair in March 2023 who presents with concern for palpitation    She was recently seen by cardiology on August 26, where she was noted to have improvement of her chronic systolic heart failure after placement of her left bundle area lead, normal device function  For the past 4 days she has been feeling off.   After starting ozempic, felt like bowels slowed down, now off of it for the past 6 months but still feels like bowels are off.  Worried that the pressure from her slow bowels is putting pressure on her heart.  Has been having worsening abdominal symptoms for the past 4 days, burning pain throughout.  Last BM was this morning, small about. Still passing flatus, no nausea or vomiting.  Feels abdominal bloating and burning.    Dyspnea for 4 days but also worse today, O2 96 usually, but today worse, worse when laying flat. Right now is a little better than usual.  No increasing leg swelling or pain. Abdomen swelling but no other swelling.  Just fluttering and that makes it hard to breathe. Fluttering for 4 days, worse this AM. Lightheadedness and dizziness.   Coughing up phlegm just early in the AM since put on the bumex for a while.  No worsening cough.   Got 20 minutes into exercising today after having a little ham biscuit and began to have some lightheadedness.  [facility] the [facility] in [place].  At first she was going to call  ambulance.  Can't distinguish if her symptoms are from heart or abdomen.  Temp 99 when usually is 97.    Called GI physician yesterday, had been on bumex, colace, senokot for one year.   Not urinating as much Back arthritis.   Past Medical History:  Diagnosis Date   Alkaline phosphatase elevation    Asthma    Breast cancer (HCC) 2011   Carpal tunnel syndrome    bilateral   Chronic HFrEF (heart failure with reduced ejection fraction) (HCC)    Cirrhosis, non-alcoholic (HCC)    Closed fracture of unspecified part of radius (alone)    DDD (degenerative disc disease), lumbar    Depression    pt denies   Diabetes mellitus without complication (HCC)    Enlarged heart    Fibromyalgia    GERD (gastroesophageal reflux disease)    History of kidney stones    HTN (hypertension)    Hypothyroidism    IBS (irritable bowel syndrome)    LBBB (left bundle branch block)    Microcytic anemia 05/14/2017   Mitral regurgitation    NICM (nonischemic cardiomyopathy) (HCC)    Pneumonia    Presence of permanent cardiac pacemaker    Pulmonary hypertension (HCC)    PVD (peripheral vascular disease) (HCC)    2019   Sleep apnea    uses cpap   Tricuspid regurgitation    Ventricular tachyarrhythmia (HCC)     Home Medications Prior to Admission medications  Medication Sig Start Date End Date Taking? Authorizing Provider  albuterol (PROVENTIL) (2.5 MG/3ML) 0.083% nebulizer solution Take 2.5 mg by nebulization every 6 (six) hours as needed for wheezing or shortness of breath.    [provider]  albuterol (VENTOLIN HFA) 108 (90 Base) MCG/ACT inhaler Inhale 1-2 puffs into the lungs every 6 (six) hours as needed for wheezing or shortness of breath. 05/27/21   [name], MD  alendronate (FOSAMAX) 70 MG tablet TAKE 1 TABLET BY MOUTH  EVERY 7 DAYS WITH A FULL  GLASS OF WATER ON AN EMPTY  STOMACH 06/18/22   [name], MD  AMBULATORY NON FORMULARY MEDICATION Medication Name:  Tubing and supplies for nebulizer. Fax - [phone] 08/14/19   [name], MD  amiodarone (PACERONE) 200 MG tablet TAKE  200 MG ONCE A DAY MONDAY THRU THURSDAY  TAKE  200 MG TWICE A DAY FRIDAY THROUGH SUNDAY 10/09/22   Sheilah Pigeon, PA-C  aspirin 81 MG chewable tablet Chew 81 mg by mouth daily.    [provider]  bisacodyl 5 MG EC tablet Take 5 mg by mouth 2 (two) times daily.    [provider]  bumetanide (BUMEX) 2 MG tablet Take 1 tablet (2 mg total) by mouth 2 (two) times daily. 02/17/23   Marcelino Duster, PA  Continuous Glucose Receiver (FREESTYLE LIBRE 3 READER) DEVI Use for continuous glucose monitoring 03/22/23   [name], MD  Continuous Glucose Sensor (FREESTYLE LIBRE 3 SENSOR) MISC by Does not apply route. Change every 14 days    [provider]  Docusate Sodium (COLACE PO) Take by mouth. 1 per day    [provider]  fluticasone-salmeterol (ADVAIR HFA) 115-21 MCG/ACT inhaler Inhale 2 puffs into the lungs 2 (two) times daily.    [provider]  glucose blood test strip To be used to check blood glucose Dx:E11.8 FreeStyle Precision blood glucose for Freestyle Libre 2 08/25/22   [name], MD  levothyroxine (SYNTHROID) 88 MCG tablet TAKE 1 TABLET BY MOUTH DAILY  BEFORE BREAKFAST 04/12/23   [name], MD  LINZESS 290 MCG CAPS capsule Take 290 mcg by mouth daily at 2 am. 03/18/23   [provider]  metFORMIN (GLUCOPHAGE-XR) 500 MG 24 hr tablet Take 1 tablet (500 mg total) by mouth 2 (two) times daily with a meal. 02/17/23   [name], MD  Multiple Vitamins-Minerals (OCUVITE EYE + MULTI) TABS Take 1 tablet by mouth in the morning.    [provider]  mupirocin cream (BACTROBAN) 2 % Apply 1 Application topically 2 (two) times daily. 12/24/22   Achille Rich, PA-C  nystatin cream (MYCOSTATIN) Apply 1 Application topically 2 (two) times daily as needed (rash). 02/16/23    [name], MD  Omega-3 Fatty Acids (FISH OIL) 1000 MG CAPS Take 1,000 mg by mouth daily.    [provider]  pantoprazole (PROTONIX) 40 MG tablet TAKE 1 TABLET BY MOUTH DAILY  BEFORE BREAKFAST 10/20/22   [name], MD  pravastatin (PRAVACHOL) 40 MG tablet Take 1 tablet (40 mg total) by mouth every evening. 09/09/22   Dunn, Tacey Ruiz, PA-C  Tiotropium Bromide Monohydrate (SPIRIVA RESPIMAT) 1.25 MCG/ACT AERS Inhale 2 puffs into the lungs daily.    [provider]  triamcinolone cream (KENALOG) 0.1 % Apply 1 Application topically 2 (two) times daily. 01/05/23   [name], MD  VITAMIN D PO Take 1 tablet by mouth daily.  [provider]      Allergies    Injectafer [ferric carboxymaltose], Iodinated contrast media, Penicillins, Accupril [quinapril hcl], Aleve [naproxen], Basle, Celebrex [celecoxib], Cozaar [losartan potassium], Flonase [fluticasone], Lactose intolerance (gi), Levomenol, Lipitor [atorvastatin], Nasonex [mometasone furoate], Xyzal [levocetirizine], Bentyl [dicyclomine], Chamomile, Entresto [sacubitril-valsartan], Vesicare [solifenacin], Aspirin, Baking soda-fluoride [sodium fluoride], Clindamycin/lincomycin, Codeine, Furosemide, Lactose, Lanolin-petrolatum, Miralax [polyethylene glycol], Quinine, Reclast [zoledronic acid], Rifampin, Sulfa antibiotics, Sulfonamide derivatives, Tramadol, Wellbutrin [bupropion], and Wool alcohol [lanolin]    Review of Systems   Review of Systems  Physical Exam Updated Vital Signs BP (!) 144/65   Pulse 63   Temp 97.6 F (36.4 C) (Oral)   Resp 18   Ht 5\' 1"  (1.549 m)   Wt 97.1 kg   SpO2 95%   BMI 40.43 kg/m  Physical Exam  ED Results / Procedures / Treatments   Labs (all labs ordered are listed, but only abnormal results are displayed) Labs Reviewed  BRAIN NATRIURETIC PEPTIDE - Abnormal; Notable for the following components:      Result Value   B Natriuretic Peptide 368.4 (*)    All  other components within normal limits  HEPATIC FUNCTION PANEL - Abnormal; Notable for the following components:   AST 56 (*)    ALT 56 (*)    All other components within normal limits  TROPONIN I (HIGH SENSITIVITY) - Abnormal; Notable for the following components:   Troponin I (High Sensitivity) 26 (*)    All other components within normal limits  SARS CORONAVIRUS 2 BY RT PCR  BASIC METABOLIC PANEL  CBC  PROTIME-INR  LIPASE, BLOOD  TROPONIN I (HIGH SENSITIVITY)    EKG None  Radiology DG Chest Port 1 View  Result Date: 04/23/2023 CLINICAL DATA:  Atrial fibrillation. Intermittent palpitations for 2 days. EXAM: PORTABLE CHEST 1 VIEW COMPARISON:  Two-view chest x-ray 10/29/2022. One-view chest x-ray 01/23/2023. FINDINGS: Heart is enlarged. No edema or effusion is present. No focal airspace disease is present. Four lead AICD is stable. Right shoulder hemiarthroplasty noted. IMPRESSION: Cardiomegaly without failure. Electronically Signed   By: Marin Roberts M.D.   On: 04/23/2023 12:03    Procedures Procedures  {Document cardiac monitor, telemetry assessment procedure when appropriate:1}  Medications Ordered in ED Medications - No data to display  ED Course/ Medical Decision Making/ A&P   {   Click here for ABCD2, HEART and other calculatorsREFRESH Note before signing :1}                              Medical Decision Making Amount and/or Complexity of Data Reviewed Labs: ordered. Radiology: ordered.   ***  {Document critical care time when appropriate:1} {Document review of labs and clinical decision tools ie heart score, Chads2Vasc2 etc:1}  {Document your independent review of radiology images, and any outside records:1} {Document your discussion with family members, caretakers, and with consultants:1} {Document social determinants of health affecting pt's care:1} {Document your decision making why or why not admission, treatments were needed:1} Final Clinical  Impression(s) / ED Diagnoses Final diagnoses:  None    Rx / DC Orders ED Discharge Orders     None

## 2023-04-23 NOTE — ED Notes (Signed)
Pt in recliner when RN arrived. Discussed with her benadryl needs to be given per allergy protocol. This will make her sleepy. Safest for her to be back in bed. She agreed and RN assisted her to bed and putting her belongings in [place]. Pt comfortable and states her motto is lets just "get her done". Call bell at her fingers. Encouraged to call with any needs or concerns.

## 2023-04-27 ENCOUNTER — Encounter: Payer: Self-pay | Admitting: Hematology and Oncology

## 2023-04-28 ENCOUNTER — Encounter: Payer: Self-pay | Admitting: Family Medicine

## 2023-04-28 ENCOUNTER — Telehealth: Payer: Self-pay

## 2023-04-28 ENCOUNTER — Ambulatory Visit (INDEPENDENT_AMBULATORY_CARE_PROVIDER_SITE_OTHER): Payer: 59 | Admitting: Family Medicine

## 2023-04-28 VITALS — BP 124/51 | HR 66 | Ht 61.0 in | Wt 218.0 lb

## 2023-04-28 DIAGNOSIS — M25512 Pain in left shoulder: Secondary | ICD-10-CM

## 2023-04-28 DIAGNOSIS — E038 Other specified hypothyroidism: Secondary | ICD-10-CM | POA: Diagnosis not present

## 2023-04-28 DIAGNOSIS — Z7984 Long term (current) use of oral hypoglycemic drugs: Secondary | ICD-10-CM

## 2023-04-28 DIAGNOSIS — I10 Essential (primary) hypertension: Secondary | ICD-10-CM

## 2023-04-28 DIAGNOSIS — L081 Erythrasma: Secondary | ICD-10-CM | POA: Diagnosis not present

## 2023-04-28 DIAGNOSIS — E118 Type 2 diabetes mellitus with unspecified complications: Secondary | ICD-10-CM | POA: Diagnosis not present

## 2023-04-28 DIAGNOSIS — K5792 Diverticulitis of intestine, part unspecified, without perforation or abscess without bleeding: Secondary | ICD-10-CM

## 2023-04-28 MED ORDER — CLOTRIMAZOLE 1 % EX CREA
1.0000 | TOPICAL_CREAM | Freq: Two times a day (BID) | CUTANEOUS | 1 refills | Status: DC
Start: 2023-04-28 — End: 2023-08-04

## 2023-04-28 MED ORDER — BENZOYL PEROXIDE 10 % EX LIQD: CUTANEOUS | 1 refills | Status: DC

## 2023-04-28 NOTE — Progress Notes (Addendum)
Established Patient Office Visit  Subjective   Patient ID: Tanya Hogan, female    DOB: 08-12-1951  Age: 72 y.o. MRN: [id number]  Chief Complaint  Patient presents with   Hospitalization Follow-up    HPI  She was seen in the emergency department on August 30 at [facility] for not feeling well, shortness of breath and feeling like her bowels were off. .  She had a CT angio that ruled out pulmonary embolism.  They did note possible groundglass appearance but no other findings consistent with pneumonia.  No elevated white blood cell count.  CT of the abdomen did raise question of possible segmental colitis or possible early diverticulitis.  So she was given Cipro and metronidazole for treatment.  She is taking the medication and so has about 3 days left.  She reports that she still just does not feel well in general she feels like she is hungry she cannot quite figure out what to eat she has avoid certain foods because of her irritable bowel, she was told to avoid dairy, and just cannot quite figure out what she should do plus she is trying to eat a soft diet and keep up her liquid intake but not over drink fluids because she has heart failure.  She is also still dealing with infrequent bowel movements.  She is also getting a lot more bloating after eating.  Has a rash in her groin crease bilaterally she says in the left side it is actually started to weep.  She had been using nystatin and that did seem to help but it just will not go away completely.  He also has an imaging appointment for her left breast as they have found a knot that they are further evaluating.  She is concerned because when she was in the emergency department they gave her pretreatment for contrast and the high dose of Benadryl just really sedated her to the point that she could not move and they had to assist her.  She says it tugged on her left shoulder that she has a lot of issues with and it strained her left  chest wall as well.  Says she would like to have the contrast allergy removed from her chart she said she has never had an allergic reaction she says years ago when she was having a liver biopsy done she said that she started to feel her nose was itchy and sneezed.  She reports that at the time somebody had walked by that had heavy perfume on and she had  told the tech that the perfume is what made her feel like she needed to sneeze.  She said she never actually experienced any type of allergic reaction to the contrast and would really like it removed from her chart.  Never had any flushing or rash etc.  He still reports feeling a little bit short of breath but says it is better than it was.    ROS    Objective:     BP (!) 124/51   Pulse 66   Ht 5\' 1"  (1.549 m)   Wt 218 lb (98.9 kg)   SpO2 98%   BMI 41.19 kg/m    Physical Exam Vitals and nursing note reviewed.  Constitutional:      Appearance: Normal appearance.  HENT:     Head: Normocephalic and atraumatic.  Eyes:     Conjunctiva/sclera: Conjunctivae normal.  Cardiovascular:     Rate and Rhythm: Normal  rate and regular rhythm.  Pulmonary:     Effort: Pulmonary effort is normal.     Breath sounds: Normal breath sounds.  Skin:    General: Skin is warm and dry.     Comments: Erythematous rash in both groin creases with some central sparing.  Fine faint pinkish oranges color.  No thick scale.  No active weeping today.  Neurological:     Mental Status: She is alert.  Psychiatric:        Mood and Affect: Mood normal.      Results for orders placed or performed in visit on 04/28/23  TSH  Result Value Ref Range   TSH 4.780 (H) 0.450 - 4.500 uIU/mL      The ASCVD Risk score (Arnett DK, et al., 2019) failed to calculate for the following reasons:   The patient has a prior MI or stroke diagnosis    Assessment & Plan:   Problem List Items Addressed This Visit       Cardiovascular and Mediastinum   Essential  hypertension    Pressure looks great today.        Endocrine   Hypothyroidism    Due to recheck TSH.  No specific concerns today.      Relevant Orders   TSH (Completed)   Controlled diabetes mellitus type 2 with complications (HCC) - Primary    Reports that she has had a few elevated blood sugars around 209 since she has not felt well.  Been monitoring carefully with her continuous glucose monitor.  Her A1c in June was 6.6.  She is due for follow-up later in the month.      Other Visit Diagnoses     Acute diverticulitis       Erythrasma       Relevant Medications   Benzoyl Peroxide (PANOXYL FOAMING WASH) 10 % LIQD   clotrimazole (LOTRIMIN) 1 % cream   Acute pain of left shoulder          Diverticulitis-she still does not feel great still feels like her bowels are little bit more sluggish and getting a lot of bloating she does have about 3 more days on the antibiotics and encouraged her to complete that also encouraged her to continue to stick with bland soft foods and stay really well-hydrated.  And only advance diet once she is feeling a little bit better.  She does have her further follow-up for her left breast coming up later this week.  Shoulder pain-she already has some known arthritis but unfortunately it did get exacerbated when they tried to help move her up in the bed during the recent ED visit so it is really been bothering her since then.  3 asthma-will treat with a benzyl peroxide wash and topical clotrimazole.  If not improving over the next 2 to 3 weeks then we can switch to a different cream if needed.  Return in about 4 weeks (around 05/26/2023) for Diabetes follow-up.   I spent 40 minutes on the day of the encounter to include pre-visit record review, face-to-face time with the patient and post visit ordering of test.   Nani Gasser, MD

## 2023-04-28 NOTE — Progress Notes (Signed)
   04/28/2023  Patient ID: Tanya Hogan, female   DOB: 09-May-1951, 72 y.o.   MRN: [id number]  Patient called stating she is needing test strips that go with [facility] 3 Reader versus [place] 2 that she was previously using.  Contacted [facility], and they state precision neo test strips were recently sent out to the patient.  Upon further discussion, these are the strips that the patient has; she did not realize they are compatible with both the [place] 2 and [place] 3 readers.  She also states she has still yet to be able to get her [name] 3 reader linked to [employer] for monitoring.  She sees Dr. Linford Arnold 9/26 at 1:20pm, and I will see her after to assist with getting this set up.  Lenna Gilford, PharmD, DPLA

## 2023-04-29 ENCOUNTER — Encounter: Payer: Self-pay | Admitting: Family Medicine

## 2023-04-29 DIAGNOSIS — N6323 Unspecified lump in the left breast, lower outer quadrant: Secondary | ICD-10-CM | POA: Diagnosis not present

## 2023-04-29 DIAGNOSIS — R92322 Mammographic fibroglandular density, left breast: Secondary | ICD-10-CM | POA: Diagnosis not present

## 2023-04-29 LAB — TSH: TSH: 4.78 u[IU]/mL — ABNORMAL HIGH (ref 0.450–4.500)

## 2023-04-29 NOTE — Progress Notes (Signed)
Tanya Hogan, the thyroid level is slightly elevated.  Can you verify your dose with me we might need to make a slight adjustment.  Or if you have missed some medication recently please let me know.

## 2023-04-30 NOTE — Assessment & Plan Note (Signed)
Pressure looks great today. 

## 2023-04-30 NOTE — Assessment & Plan Note (Signed)
Due to recheck TSH.  No specific concerns today.

## 2023-04-30 NOTE — Assessment & Plan Note (Addendum)
Reports that she has had a few elevated blood sugars around 209 since she has not felt well.  Been monitoring carefully with her continuous glucose monitor.  Her A1c in June was 6.6.  She is due for follow-up later in the month.

## 2023-05-02 DIAGNOSIS — G4733 Obstructive sleep apnea (adult) (pediatric): Secondary | ICD-10-CM | POA: Diagnosis not present

## 2023-05-04 ENCOUNTER — Ambulatory Visit (INDEPENDENT_AMBULATORY_CARE_PROVIDER_SITE_OTHER): Payer: 59

## 2023-05-04 DIAGNOSIS — I442 Atrioventricular block, complete: Secondary | ICD-10-CM

## 2023-05-05 LAB — CUP PACEART REMOTE DEVICE CHECK
Battery Remaining Longevity: 71 mo
Battery Voltage: 3.01 V
Brady Statistic AP VP Percent: 59.15 %
Brady Statistic AP VS Percent: 0.18 %
Brady Statistic AS VP Percent: 39.19 %
Brady Statistic AS VS Percent: 1.48 %
Brady Statistic RA Percent Paced: 60.07 %
Brady Statistic RV Percent Paced: 98.34 %
Date Time Interrogation Session: 20240909190823
Implantable Lead Connection Status: 753985
Implantable Lead Connection Status: 753985
Implantable Lead Connection Status: 753985
Implantable Lead Implant Date: 20030324
Implantable Lead Implant Date: 20030324
Implantable Lead Implant Date: 20230313
Implantable Lead Location: 753858
Implantable Lead Location: 753859
Implantable Lead Location: 753860
Implantable Lead Model: 3830
Implantable Lead Model: 4068
Implantable Lead Model: 4193
Implantable Pulse Generator Implant Date: 20230313
Lead Channel Impedance Value: 3306 Ohm
Lead Channel Impedance Value: 3306 Ohm
Lead Channel Impedance Value: 3306 Ohm
Lead Channel Impedance Value: 361 Ohm
Lead Channel Impedance Value: 380 Ohm
Lead Channel Impedance Value: 475 Ohm
Lead Channel Impedance Value: 475 Ohm
Lead Channel Impedance Value: 817 Ohm
Lead Channel Impedance Value: 931 Ohm
Lead Channel Pacing Threshold Amplitude: 1.125 V
Lead Channel Pacing Threshold Amplitude: 2.25 V
Lead Channel Pacing Threshold Pulse Width: 0.4 ms
Lead Channel Pacing Threshold Pulse Width: 0.8 ms
Lead Channel Sensing Intrinsic Amplitude: 1.75 mV
Lead Channel Sensing Intrinsic Amplitude: 1.75 mV
Lead Channel Sensing Intrinsic Amplitude: 11.625 mV
Lead Channel Sensing Intrinsic Amplitude: 11.625 mV
Lead Channel Setting Pacing Amplitude: 2.25 V
Lead Channel Setting Pacing Amplitude: 2.5 V
Lead Channel Setting Pacing Amplitude: 3 V
Lead Channel Setting Pacing Pulse Width: 0.4 ms
Lead Channel Setting Pacing Pulse Width: 0.8 ms
Lead Channel Setting Sensing Sensitivity: 2.8 mV
Zone Setting Status: 755011
Zone Setting Status: 755011

## 2023-05-06 DIAGNOSIS — M1712 Unilateral primary osteoarthritis, left knee: Secondary | ICD-10-CM | POA: Diagnosis not present

## 2023-05-13 ENCOUNTER — Other Ambulatory Visit: Payer: Self-pay | Admitting: *Deleted

## 2023-05-17 ENCOUNTER — Ambulatory Visit (INDEPENDENT_AMBULATORY_CARE_PROVIDER_SITE_OTHER): Payer: 59 | Admitting: Family Medicine

## 2023-05-17 ENCOUNTER — Encounter: Payer: Self-pay | Admitting: Family Medicine

## 2023-05-17 VITALS — BP 155/79 | HR 82 | Ht 61.0 in | Wt 215.0 lb

## 2023-05-17 DIAGNOSIS — J4540 Moderate persistent asthma, uncomplicated: Secondary | ICD-10-CM | POA: Diagnosis not present

## 2023-05-17 DIAGNOSIS — J301 Allergic rhinitis due to pollen: Secondary | ICD-10-CM | POA: Diagnosis not present

## 2023-05-17 DIAGNOSIS — J3081 Allergic rhinitis due to animal (cat) (dog) hair and dander: Secondary | ICD-10-CM | POA: Diagnosis not present

## 2023-05-17 DIAGNOSIS — T8090XA Unspecified complication following infusion and therapeutic injection, initial encounter: Secondary | ICD-10-CM | POA: Insufficient documentation

## 2023-05-17 DIAGNOSIS — J3089 Other allergic rhinitis: Secondary | ICD-10-CM | POA: Diagnosis not present

## 2023-05-17 DIAGNOSIS — K0510 Chronic gingivitis, plaque induced: Secondary | ICD-10-CM | POA: Diagnosis not present

## 2023-05-17 MED ORDER — VALACYCLOVIR HCL 1 G PO TABS: 1000.0000 mg | ORAL_TABLET | Freq: Two times a day (BID) | ORAL | 2 refills | Status: DC

## 2023-05-17 NOTE — Assessment & Plan Note (Addendum)
Reassured that this should continue to improve over the next few days.  May continue tylenol and recommend cool compress to area.  Having some fatigue which may be related to injection.  Discussed that viral illness may be contributing as well.  No other signs suggestive of CHF exacerbation or other cause at this time. Red flags and precautions given.

## 2023-05-17 NOTE — Progress Notes (Signed)
Tanya Hogan - 72 y.o. female MRN [id number]  Date of birth: [birth year]  Subjective Chief Complaint  Patient presents with   Medication Reaction    HPI Tanya Hogan is a 72 y.o. female here today with complaint of reaction to immunization.  She received immunizations for COVID and flu last week.  She noticed redness and swelling around injection site that began the following day after injections.  She has been using tylenol for the past 2 days.   This is improving.  She does reports that she has felt more tired and fatigued since receiving injection.  She denies chest pain, dyspnea, fever or chills.  Denies GI symptoms.    ROS:  A comprehensive ROS was completed and negative except as noted per HPI  Allergies  Allergen Reactions   Injectafer [Ferric Carboxymaltose] Shortness Of Breath    Mild SOB following injectafer infusion Mild SOB following injectafer infusion    Penicillins Anaphylaxis and Rash    Has patient had a PCN reaction causing immediate rash, facial/tongue/throat swelling, SOB or lightheadedness with hypotension: Yes Has patient had a PCN reaction causing severe rash involving mucus membranes or skin necrosis: No Has patient had a PCN reaction that required hospitalization: No Has patient had a PCN reaction occurring within the last 10 years: No If all of the above answers are "NO", then may proceed with Cephalosporin use. "Stuttered with medication"     Accupril [Quinapril Hcl] Other (See Comments)    Hyperkalemia=high potassium level   Aleve [Naproxen] Swelling   Basle Rash   Celebrex [Celecoxib]     ulcers   Cozaar [Losartan Potassium] Itching   Flonase [Fluticasone] Other (See Comments)    Nasal ulcer.     Lactose Intolerance (Gi) Other (See Comments)    Increased calcium, sneezing, nasal issues   Levomenol Other (See Comments)    unknown unknown    Lipitor [Atorvastatin] Other (See Comments)    muscle aches/pains   Nasonex [Mometasone Furoate]  Other (See Comments)    nosebleed   Xyzal [Levocetirizine] Other (See Comments)    Doesn't recall reaction type   Bentyl [Dicyclomine]    Chamomile Other (See Comments)    unknown   Entresto [Sacubitril-Valsartan]     Hyperkalemia   Vesicare [Solifenacin] Other (See Comments)    "stopped me from urinating"   Aspirin Other (See Comments)    Pt states "burns holes in stomach", ulcers    Baking Soda-Fluoride [Sodium Fluoride] Itching and Rash   Clindamycin/Lincomycin Rash   Codeine Nausea And Vomiting   Furosemide Other (See Comments)    Nose bleed   Lactose Other (See Comments)   Lanolin-Petrolatum Rash   Miralax [Polyethylene Glycol] Rash   Quinine Rash   Reclast [Zoledronic Acid] Rash and Other (See Comments)    shaking   Rifampin Other (See Comments)    DOES NOT REMEMBER THIS MEDICATION OR ALLERGY   Sulfa Antibiotics Rash   Sulfonamide Derivatives Rash   Tramadol Rash    Anxiety   Wellbutrin [Bupropion] Other (See Comments)    Muscle aches, pain, "made my skin crawl"   Wool Alcohol [Lanolin] Rash    Past Medical History:  Diagnosis Date   Alkaline phosphatase elevation    Asthma    Breast cancer (HCC) 2011   Carpal tunnel syndrome    bilateral   Chronic HFrEF (heart failure with reduced ejection fraction) (HCC)    Cirrhosis, non-alcoholic (HCC)    Closed fracture of unspecified part of radius (  alone)    DDD (degenerative disc disease), lumbar    Depression    pt denies   Diabetes mellitus without complication (HCC)    Enlarged heart    Fibromyalgia    GERD (gastroesophageal reflux disease)    History of kidney stones    HTN (hypertension)    Hypothyroidism    IBS (irritable bowel syndrome)    LBBB (left bundle branch block)    Microcytic anemia 05/14/2017   Mitral regurgitation    NICM (nonischemic cardiomyopathy) (HCC)    Pneumonia    Presence of permanent cardiac pacemaker    Pulmonary hypertension (HCC)    PVD (peripheral vascular disease) (HCC)     2019   Sleep apnea    uses cpap   Tricuspid regurgitation    Ventricular tachyarrhythmia [facility])     Past Surgical History:  Procedure Laterality Date   BIV PACEMAKER GENERATOR CHANGEOUT N/A 11/03/2021   Procedure: BIV PACEMAKER GENERATOR CHANGEOUT;  Surgeon: Marinus Maw, MD;  Location: [facility] INVASIVE CV LAB;  Service: Cardiovascular;  Laterality: N/A;   BREAST LUMPECTOMY Left 2011   COLONOSCOPY     FOOT SURGERY     KNEE SURGERY     LEAD REVISION/REPAIR N/A 11/03/2021   Procedure: LEAD REVISION/REPAIR;  Surgeon: Marinus Maw, MD;  Location: [facility]AS[facility] CV [facility];  Service: Cardiovascular;  Laterality: N/A;   MASTECTOMY Right    05/2019   PACEMAKER GENERATOR CHANGE N/A 08/23/2014   Procedure: PACEMAKER GENERATOR CHANGE;  Surgeon: Marinus Maw, MD;  Location: [facility] CATH LAB;  Service: Cardiovascular;  Laterality: N/A;   PACEMAKER PLACEMENT  2009   [facility]   RECTAL SURGERY  1976   REDUCTION MAMMAPLASTY Left    05/2019   TOTAL SHOULDER ARTHROPLASTY Right 03/10/2018   TOTAL SHOULDER ARTHROPLASTY Right 03/10/2018   Procedure: RIGHT TOTAL SHOULDER ARTHROPLASTY;  Surgeon: Jones Broom, MD;  Location: [facility] OR;  Service: Orthopedics;  Laterality: Right;   TUBAL LIGATION      Social History   Socioeconomic History   Marital status: Divorced    Spouse name: Not on file   Number of children: 2   Years of education: 14   Highest education level: Associate degree: academic program  Occupational History   Occupation: quality insurcance in Sports coach    Comment: retired  Tobacco Use   Smoking status: Former    Current packs/day: 0.00    Types: Cigarettes    Quit date: 08/24/1966    Years since quitting: 56.7   Smokeless tobacco: Never  Vaping Use   Vaping status: Never Used  Substance and Sexual Activity   Alcohol use: No   Drug use: No   Sexual activity: Not Currently  Other Topics Concern   Not on file  Social History Narrative   Retired. Lives with and helps  her brother. Likes Risk manager, Nutritional therapist and music. Loves animals.   Social Determinants of Health   Financial Resource Strain: Low Risk  (04/09/2023)   Received from [facility]   Overall Financial Resource Strain (CARDIA)    Difficulty of Paying Living Expenses: Not hard at all  Food Insecurity: No Food Insecurity (04/09/2023)   Received from [facility]   Hunger Vital Sign    Worried About Running Out of Food in the Last Year: Never true    Ran Out of Food in the Last Year: Never true  Transportation Needs: No Transportation Needs (04/09/2023)   Received from [facility] -  Administrator, Civil Service (Medical): No    Lack of Transportation (Non-Medical): No  Physical Activity: Sufficiently Active (08/06/2022)   Exercise Vital Sign    Days of Exercise per Week: 5 days    Minutes of Exercise per Session: 60 min  Stress: No Stress Concern Present (08/29/2022)   Received from [facility] - Occupational Stress Questionnaire    Feeling of Stress : Not at all  Social Connections: Moderately Integrated (08/06/2022)   Social Connection and Isolation Panel [NHANES]    Frequency of Communication with Friends and Family: More than three times a week    Frequency of Social Gatherings with Friends and Family: Three times a week    Attends Religious Services: More than 4 times per year    Active Member of Clubs or Organizations: Yes    Attends Engineer, structural: More than 4 times per year    Marital Status: Divorced    Family History  Problem Relation Age of Onset   Heart disease Mother    Melanoma Mother    Parkinson's disease Mother    Heart disease Father    Heart disease Brother    Heart failure Other    Breast cancer Other    Hypertension Other     Health Maintenance  Topic Date Due   FOOT EXAM  04/09/2023   COVID-19 Vaccine (5 - 2023-24 season) 04/25/2023   INFLUENZA  VACCINE  11/22/2023 (Originally 03/25/2023)   OPHTHALMOLOGY EXAM  05/27/2023   Medicare Annual Wellness (AWV)  08/07/2023   HEMOGLOBIN A1C  08/18/2023   Diabetic kidney evaluation - Urine ACR  02/16/2024   Colonoscopy  04/06/2024   Diabetic kidney evaluation - eGFR measurement  04/22/2024   MAMMOGRAM  01/11/2025   DTaP/Tdap/Td (5 - Td or Tdap) 07/27/2032   Pneumonia Vaccine 4+ Years old  Completed   DEXA SCAN  Completed   Hepatitis C Screening  Completed   Zoster Vaccines- Shingrix  Completed   HPV VACCINES  Aged Out     ----------------------------------------------------------------------------------------------------------------------------------------------------------------------------------------------------------------- Physical Exam BP (!) 155/79 (BP Location: Left Wrist, Patient Position: Sitting, Cuff Size: Normal)   Pulse 82   Ht 5\' 1"  (1.549 m)   Wt 215 lb (97.5 kg)   SpO2 97%   BMI 40.62 kg/m   Physical Exam Constitutional:      Appearance: Normal appearance.  Eyes:     General: No scleral icterus. Cardiovascular:     Rate and Rhythm: Normal rate and regular rhythm.  Pulmonary:     Effort: Pulmonary effort is normal.     Breath sounds: Normal breath sounds.  Neurological:     Mental Status: She is alert.  Psychiatric:        Mood and Affect: Mood normal.        Behavior: Behavior normal.     ------------------------------------------------------------------------------------------------------------------------------------------------------------------------------------------------------------------- Assessment and Plan  Injection site reaction Reassured that this should continue to improve over the next few days.  May continue tylenol and recommend cool compress to area.  Having some fatigue which may be related to injection.  Discussed that viral illness may be contributing as well.  No other signs suggestive of CHF exacerbation or other cause at this  time. Red flags and precautions given.    Meds ordered this encounter  Medications   valACYclovir (VALTREX) 1000 MG tablet    Sig: Take 1 tablet (1,000 mg total) by mouth 2 (two) times daily.  Dispense:  14 tablet    Refill:  2    No follow-ups on file.    This visit occurred during the SARS-CoV-2 public health emergency.  Safety protocols were in place, including screening questions prior to the visit, additional usage of staff PPE, and extensive cleaning of exam room while observing appropriate contact time as indicated for disinfecting solutions.

## 2023-05-19 ENCOUNTER — Telehealth: Payer: Self-pay

## 2023-05-19 NOTE — Progress Notes (Signed)
05/19/2023  Patient ID: Tanya Hogan, female   DOB: [birth year], 72 y.o.   MRN: [id number]  Patient outreach attempt to remind patient that I plan to see her after her visit with Dr. Linford Arnold tomorrow at 120pm to assist with linking [facility] reader information to [facility] system.  I was not able to reach the patient, but I did leave a HIPAA compliant voicemail with my direct number and will also send a message via MyChart.  Lenna Gilford, PharmD, DPLA

## 2023-05-20 ENCOUNTER — Ambulatory Visit: Payer: Self-pay

## 2023-05-20 ENCOUNTER — Encounter: Payer: Self-pay | Admitting: Family Medicine

## 2023-05-20 ENCOUNTER — Ambulatory Visit: Payer: 59 | Admitting: Family Medicine

## 2023-05-20 VITALS — BP 112/46 | HR 78 | Ht 61.0 in | Wt 215.0 lb

## 2023-05-20 DIAGNOSIS — Z7984 Long term (current) use of oral hypoglycemic drugs: Secondary | ICD-10-CM | POA: Diagnosis not present

## 2023-05-20 DIAGNOSIS — E118 Type 2 diabetes mellitus with unspecified complications: Secondary | ICD-10-CM | POA: Diagnosis not present

## 2023-05-20 DIAGNOSIS — K7581 Nonalcoholic steatohepatitis (NASH): Secondary | ICD-10-CM

## 2023-05-20 DIAGNOSIS — I10 Essential (primary) hypertension: Secondary | ICD-10-CM | POA: Diagnosis not present

## 2023-05-20 DIAGNOSIS — K5909 Other constipation: Secondary | ICD-10-CM

## 2023-05-20 DIAGNOSIS — E038 Other specified hypothyroidism: Secondary | ICD-10-CM

## 2023-05-20 DIAGNOSIS — M810 Age-related osteoporosis without current pathological fracture: Secondary | ICD-10-CM

## 2023-05-20 DIAGNOSIS — I5022 Chronic systolic (congestive) heart failure: Secondary | ICD-10-CM

## 2023-05-20 LAB — POCT GLYCOSYLATED HEMOGLOBIN (HGB A1C): Hemoglobin A1C: 6.4 % — AB (ref 4.0–5.6)

## 2023-05-20 NOTE — Progress Notes (Signed)
7 day average =131 14 day average =128

## 2023-05-20 NOTE — Assessment & Plan Note (Signed)
Pretty tight regimen of Colace, Dulcolax and Linzess and that does seem to keep things moving consistently.

## 2023-05-20 NOTE — Progress Notes (Signed)
05/20/2023 Name: Tanya Hogan MRN: [id number] DOB: 10-Jul-1951  Chief Complaint  Patient presents with   Diabetes   Tanya Hogan is a 72 y.o. year old female who presented for a face to face visit today to sync [place] 3 Reader with [facility] Dashboard for PCK  Subjective:  Care Team: Primary Care Provider: [name], MD ; Next Scheduled Visit: 12/27  Medication Access/Adherence  Current Pharmacy:  [facility] [phone] - [place] - [address] AT [facility] [address] [place] [address] Phone: [phone] Fax: [phone]  [facility]) - [place], [place] - [address] [facility] [address] [place] [address] [place] [address] Phone: [phone] Fax: [phone]  [facility] [id number] - [place], [place] - [address] [place] [address] Phone: [phone] Fax: [phone]  [facility] - [place], [place] - [address] [place]  [address] Phone: [phone] Fax: [phone]  [facility] [address] [place] [address] Phone: [phone] Fax: [phone]  -Patient reports affordability concerns with their medications: No  -Patient reports access/transportation concerns to their pharmacy: No  -Patient reports adherence concerns with their medications:  No    Diabetes: Current medications: metformin xr 500mg  BID with meals  -Using Freestyle Libre3 for CGM- unable to download information or link to [employer] at this visit Last 14 days:  % Time CGM is active: 64% Average Glucose: 128 mg/dL Time in Goal:  - Time in range 70-180: 95% - Time above range: 5% - Time below range: 0%  -Patient states BG has varied quite a bit since receiving COVID and Flu vaccines recently; she does not endorse any dietary changes -A1c today  6.4%  Medication Management: -Patient is needing Linzess prescription sent to [facility] -She also endorses needing refills on a few other medications, but cannot remember which ones at this time  Objective: Lab Results  Component Value Date   HGBA1C 6.4 (A) 05/20/2023   Medications Reviewed Today     Reviewed by Lenna Gilford, RPH (Pharmacist) on 05/20/23 at 1525  Med List Status: <None>   Medication Order Taking? Sig Documenting Provider Last Dose Status Informant  albuterol (PROVENTIL) (2.5 MG/3ML) 0.083% nebulizer solution [id number]  Take 2.5 mg by nebulization every 6 (six) hours as needed for wheezing or shortness of breath. [provider]  Active Self, Pharmacy Records  albuterol (VENTOLIN HFA) 108 (90 Base) MCG/ACT inhaler [id number]  Inhale 1-2 puffs into the lungs every 6 (six) hours as needed for wheezing or shortness of breath. [name], MD  Active Self, Pharmacy Records  alendronate (FOSAMAX) 70 MG tablet [id number]  TAKE 1 TABLET BY MOUTH  EVERY 7 DAYS WITH A FULL  GLASS OF WATER ON AN EMPTY  STOMACH [name], MD  Active Self, Pharmacy Records  AMBULATORY Clent [facility]ark MEDICATION [id number]  Medication Name: Tubing and supplies for nebulizer. Fax - [phone] [name], MD  Active Self, Pharmacy Records  amiodarone (PACERONE) 200 MG tablet [id number]  TAKE  200 MG ONCE A DAY MONDAY THRU THURSDAY  TAKE  200 MG TWICE A DAY FRIDAY THROUGH SUNDAY Sheilah Pigeon, [place]  Active Self, Pharmacy Records  aspirin 81 MG chewable tablet [id number]  Chew 81 mg by mouth daily. [provider]  Active Self, Pharmacy Records  Benzoyl Peroxide (PANOXYL FOAMING WASH) 10 % LIQD [id number]  Wash groin creases daily with this wash  Ok to sub something similar [name], MD  Active   bisacodyl 5 MG EC tablet [id number]  Take 5 mg by mouth 2 (two) times daily. [provider]  Active   bumetanide (BUMEX) 2 MG  tablet [id number]  Take 1 tablet (2 mg total) by mouth 2 (two) times daily. Marcelino Duster, PA  Active   clotrimazole (LOTRIMIN) 1 % cream [id number]  Apply 1 Application topically 2 (two) times daily. X 2-3 weeks [name], MD  Active   Continuous Glucose Receiver (FREESTYLE LIBRE 3 READER) DEVI [id number]  Use for continuous glucose monitoring [name], MD  Active   Continuous Glucose Sensor (FREESTYLE LIBRE 3 SENSOR) [place] [id number]  by Does not apply route. Change every 14 days [provider]  Active   Docusate Sodium (COLACE PO) [id number]  Take by mouth. 1 per day [provider]  Active   fexofenadine (ALLEGRA) 180 MG tablet [id number] Yes Take 180 mg by mouth daily. [provider]  Active   fluticasone-salmeterol (ADVAIR HFA) 884-16 MCG/ACT inhaler [id number]  Inhale 2 puffs into the lungs 2 (two) times daily. [provider]  Active Self, Pharmacy Records  glucose blood test strip [id number]  To be used to check blood glucose Dx:E11.8 FreeStyle Precision blood glucose for Freestyle Libre 2 [name], MD  Active Self, Pharmacy Records  levothyroxine (SYNTHROID) 88 MCG tablet [id number]  TAKE 1 TABLET BY MOUTH DAILY  BEFORE BREAKFAST [name], MD  Active   LINZESS 290 MCG CAPS capsule [id number]  Take 290 mcg by mouth daily at 2 am. [provider]  Active   metFORMIN (GLUCOPHAGE-XR) 500 MG 24 hr tablet [id number]  Take 1 tablet (500 mg total) by mouth 2 (two) times daily with a meal. [name], MD  Active   Multiple Vitamins-Minerals (OCUVITE EYE + MULTI) TABS [id number]  Take 1 tablet by mouth in the morning. [provider]  Active Self, Pharmacy Records  nystatin cream (MYCOSTATIN) [id number]  Apply 1 Application topically 2 (two) times daily as needed (rash). [name], MD  Active   Omega-3 Fatty Acids (FISH OIL) 1000 MG CAPS [id number]  Take 1,000 mg by mouth daily.  [provider]  Active Self, Pharmacy Records  pantoprazole (PROTONIX) 40 MG tablet [id number]  TAKE 1 TABLET BY MOUTH DAILY  BEFORE BREAKFAST [name], MD  Active Self, Pharmacy Records  pravastatin (PRAVACHOL) 40 MG tablet [id number]  Take 1 tablet (40 mg total) by mouth every evening. Laurann Montana, PA-C  Active Self, Pharmacy Records  Tiotropium Bromide Monohydrate (SPIRIVA RESPIMAT) 1.25 MCG/ACT AERS [id number]  Inhale 2 puffs into the lungs daily. [provider]  Active Self, Pharmacy Records  valACYclovir (VALTREX) 1000 MG tablet [id number]  Take 1 tablet (1,000 mg total) by mouth 2 (two) times daily. Everrett Coombe, DO  Active   VITAMIN D PO [id number]  Take 1 tablet by mouth daily. [provider]  Active Self, Pharmacy Records           Assessment/Plan:   Diabetes: - Currently controlled - Continue current regimen at this time, regular monitoring of BG, and regular follow-up with PCP - Will attempt to sync Libre3 Reader information to [facility] again at next PCP visit  Medication Management: - Patient will call me with medications  needing refills, so I can request these when contacting [facility] regarding transferring Linzess refills  Follow Up Plan: 12/27 to sync [place] Reader  Lenna Gilford, PharmD, DPLA

## 2023-05-20 NOTE — Assessment & Plan Note (Signed)
Plan to check Vti D levels.

## 2023-05-20 NOTE — Progress Notes (Addendum)
Established Patient Office Visit  Subjective   Patient ID: Tanya Hogan, female    DOB: 10-Jan-1951  Age: 72 y.o. MRN: [id number]  Chief Complaint  Patient presents with   Diabetes    HPI  Here today for follow-up diabetes she is also going to meet with our clinical pharmacist to work on her CGM.  She says her blood sugars have actually been doing pretty well until she got her COVID and flu shot last Wednesday she says since then it has been up-and-down.  Using her continuous glucose monitor to help monitor her blood sugars.  She has had some glucose numbers in the 200s even just with eating small amounts.  She denies any dietary changes.  But did want to let me know about that.  Follow-up hypothyroidism-we adjusted her thyroid medication about 4 weeks ago so she is taking an extra half a tab on Sundays she says she really has felt a whole lot better since the incremental change she feels like some of her brain fog has improved but has not completely cleared.  She feels like she has just a little bit more energy but still feels a little sluggish and tired.  Denies any recent lower extremity swelling.  She does report that she has not been resting well lately normally she can take a couple Tylenol before bedtime to help with aches and pains and sleeps great but this has not been helping lately.  Chronic constipation-she has been on a regimen of Colace, Dulcolax and Linzess when she takes all of them regularly it seems to keep things moving but when she stops even one of them like the Dulcolax she will sometimes not go for a couple of days.  Is been under a lot of stress living with her brother.  He is not doing a lot of self-care he is not bathing he is not changes in the close.  He will not let any home health into the home.  It is his home and she lives with him and has been trying to find another place to live.  Her daughter is supportive but her daughter does not have room for her to move  in with her.    ROS    Objective:     BP (!) 112/46   Pulse 78   Ht 5\' 1"  (1.549 m)   Wt 215 lb (97.5 kg)   SpO2 99%   BMI 40.62 kg/m    Physical Exam Vitals and nursing note reviewed.  Constitutional:      Appearance: Normal appearance.  HENT:     Head: Normocephalic and atraumatic.  Eyes:     Conjunctiva/sclera: Conjunctivae normal.  Cardiovascular:     Rate and Rhythm: Normal rate and regular rhythm.  Pulmonary:     Effort: Pulmonary effort is normal.     Breath sounds: Normal breath sounds.  Skin:    General: Skin is warm and dry.  Neurological:     Mental Status: She is alert.  Psychiatric:        Mood and Affect: Mood normal.      Results for orders placed or performed in visit on 05/20/23  POCT HgB A1C  Result Value Ref Range   Hemoglobin A1C 6.4 (A) 4.0 - 5.6 %   HbA1c POC (<> result, manual entry)     HbA1c, POC (prediabetic range)     HbA1c, POC (controlled diabetic range)        The ASCVD Risk  score (Arnett DK, et al., 2019) failed to calculate for the following reasons:   The patient has a prior MI or stroke diagnosis    Assessment & Plan:   Problem List Items Addressed This Visit       Cardiovascular and Mediastinum   Essential hypertension    Blood pressure looks great today.      Chronic systolic heart failure (HCC)    Ankles with no edema no sign of volume overload today.  Weight is stable at 215.  Blood pressure is well-controlled.        Digestive   NASH (nonalcoholic steatohepatitis)    Continue to work on healthy diet and regular exercise.      Chronic constipation    Pretty tight regimen of Colace, Dulcolax and Linzess and that does seem to keep things moving consistently.        Endocrine   Hypothyroidism    Is seeing improvement after we added an extra half a tab 1 day a week for to her thyroid medication regimen.  Normally she takes 88 mcg daily.  Plan to recheck her level again in about 3 weeks we will go  ahead and print lab slip today and she can go at her convenience.      Relevant Orders   TSH + free T4   Controlled diabetes mellitus type 2 with complications (HCC) - Primary    Hemoglobin A1c looks great today at 6.4 which is down from previous of 6.6.  She really is doing a great job.  Though I am a little concerned that her glucose numbers have been off since she received her vaccines last week.  She is can also consult with our clinical pharmacist this afternoon about her CGM which she is currently wearing today and try to download some of her numbers.      Relevant Orders   POCT HgB A1C (Completed)     Musculoskeletal and Integument   Osteoporosis    Plan to check Vti D levels.       Relevant Orders   VITAMIN D 25 Hydroxy (Vit-D Deficiency, Fractures)    Return in about 3 months (around 08/20/2023) for Diabetes follow-up.   I spent 45 minutes on the day of the encounter to include pre-visit record review, face-to-face time with the patient and post visit ordering of test.  Nani Gasser, MD

## 2023-05-20 NOTE — Assessment & Plan Note (Signed)
Is seeing improvement after we added an extra half a tab 1 day a week for to her thyroid medication regimen.  Normally she takes 88 mcg daily.  Plan to recheck her level again in about 3 weeks we will go ahead and print lab slip today and she can go at her convenience.

## 2023-05-20 NOTE — Assessment & Plan Note (Signed)
Continue to work on healthy diet and regular exercise. ?

## 2023-05-20 NOTE — Assessment & Plan Note (Signed)
Blood pressure looks great today.

## 2023-05-20 NOTE — Assessment & Plan Note (Signed)
Ankles with no edema no sign of volume overload today.  Weight is stable at 215.  Blood pressure is well-controlled.

## 2023-05-21 NOTE — Progress Notes (Signed)
Remote pacemaker transmission.   

## 2023-05-21 NOTE — Assessment & Plan Note (Signed)
Hemoglobin A1c looks great today at 6.4 which is down from previous of 6.6.  She really is doing a great job.  Though I am a little concerned that her glucose numbers have been off since she received her vaccines last week.  She is can also consult with our clinical pharmacist this afternoon about her CGM which she is currently wearing today and try to download some of her numbers.

## 2023-05-25 ENCOUNTER — Other Ambulatory Visit: Payer: Self-pay

## 2023-05-25 NOTE — Patient Outreach (Signed)
Care Management   Visit Note  05/25/2023 Name: Tanya Hogan MRN: [id number] DOB: [birth year]  Subjective: Tanya Hogan is a 72 y.o. year old female who is a primary care patient of [name], MD. The Care Management team was consulted for assistance.      Engaged with patient spoke with patient by telephone.    Goals Addressed             This Visit's Progress    RNCM Care Management Expected Outcome:  Monitor, Self-Manage and Reduce Symptoms of Diabetes       Current Barriers:  Knowledge Deficits related to constipation and having to change medications to see if constipation is resolved and blood sugar elevations after medication changes Chronic Disease Management support and education needs related to effective management of DM Lab Results  Component Value Date   HGBA1C 6.6 (A) 02/16/2023     Planned Interventions: Provided education to patient about basic DM disease process. The patient states her blood sugars have been bouncing around since she has not been taking her Ozempic. She is back taking Metformin 500 mg BID. She states that she is monitoring and her blood sugars are going up more after eating but then coming back down. She is more stable with the Ozempic but it causes her to have more GI issues so she is not taking the Ozempic . The patient did have a slightly elevated A1C at her last pcp visit but she feels she is doing well at managing her DM health and well being.  Reviewed medications with patient and discussed importance of medication adherence. The patient is  taking Metformin 500 mg BID. The patient is working with the pharm D as needed for effective management of her medications. She says she is doing better about not missing her medications. Talked to the patient about setting alarms on her phone as a reminder to take her medications but she does not want to do that right now. The patient is not as overwhelmed during this outreach. She feels she is  more stable than she has been in a while. Reflective listening and support given. She wants the provider to know that the [place] 3 sensors are working the best of any and the first sensor stayed on 14 days and the 2nd one 10 and she has the 3rd sensor on. ;        Reviewed prescribed diet with patient heart healthy/ADA. Review and education provided. Education and support given ; Counseled on importance of regular laboratory monitoring as prescribed. Has regular labwork. Labs up to date. Slight trend up of A1C. Discussed plans with patient for ongoing care management follow up and provided patient with direct contact information for care management team;      Provided patient with written educational materials related to hypo and hyperglycemia and importance of correct treatment. Denies any issues with hypo or hyperglycemia. The patient denies any acute highs or lows right now. .;       Reviewed scheduled/upcoming provider appointments including: 05-20-2023 at 120 pm. Reminder given today;         Advised patient, providing education and rationale, to check cbg twice daily and when you have symptoms of low or high blood sugar and record. Currently the patient has a continuous reader. She states that it has been 100 to 324. She states fasting it is 120 to 130.  7 day average has been around 142 when saw the doctor last. Usually after  eating it goes up to 200's. Review of goal of fasting is <130 and post prandial of <180.   She has the [employer] 3 and is now working on strips to go in that meter so she will just have one meter to keep up with.  Referral made to pharmacy team for assistance with PAP for DM medications. The patient is working with the pharm D for medication management and support Review of patient status, including review of consultants reports, relevant laboratory and other test results, and medications completed;       Advised patient to discuss changes in DM, questions, or concerns with  provider;      Screening for signs and symptoms of depression related to chronic disease state;        Assessed social determinant of health barriers;  The patient has made positive changes in her habits and lifestyle. She feels like she is not as stressed. She says going to the senior center is helping her with her socialization and having a better relationship with her family as well. 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 Talked about self care.  Encouraged the patient to continue doing things she enjoys. Review and updates given. The patients son is currently in the hospital and she is concerned about him but he is doing better. He had to have his appendix out, now he is back in the hospital with problems with his gallbladder. She is going to day and "lay eyes on him". She is trying to focus on not overdoing activity and pacing her activity. She does not go outside when the temperatures outside are >85. The patient called today and she needed to change her appointment. Appointment changes to 06-23-2023 at 345 pm. She also ask about help with a SW referral. The patient states she needs to find a place to live as she lives with her brother and needs to move. Have sent a secure message to the scheduler to get the patient scheduled with SW. Will continue to monitor.    Symptom Management: Take medications as prescribed   Attend all scheduled provider appointments Call provider office for new concerns or questions  call the Suicide and Crisis Lifeline: 988 call the [place] National Suicide Prevention Lifeline: (786) 137-2027 or TTY: (209)074-6917 TTY 670-217-8095) to talk to a trained counselor call 1-800-273-TALK (toll free, 24 hour hotline) if experiencing a Mental Health or Behavioral Health Crisis  check feet daily for cuts, sores or redness enter blood sugar readings and medication or insulin into daily log take the blood  sugar log to all doctor visits trim toenails straight across manage portion size keep feet up while sitting wash and dry feet carefully every day wear comfortable, cotton socks wear comfortable, well-fitting shoes  Follow Up Plan: Telephone follow up appointment with care management team member scheduled for: 06-23-2023 at 345 pm             Consent to Services:  Patient was given information about care management services, agreed to services, and gave verbal consent to participate.   Plan: Telephone follow up appointment with care management team member scheduled for: 06-23-2023 at 345 pm  Alto Denver RN, MSN, CCM RN Care Manager  [facility]  Ambulatory Care Management  Direct Number: [phone]

## 2023-05-25 NOTE — Patient Instructions (Signed)
Visit Information  Thank you for taking time to visit with me today. Please don't hesitate to contact me if I can be of assistance to you before our next scheduled telephone appointment.  Following are the goals we discussed today:   Goals Addressed             This Visit's Progress    RNCM Care Management Expected Outcome:  Monitor, Self-Manage and Reduce Symptoms of Diabetes       Current Barriers:  Knowledge Deficits related to constipation and having to change medications to see if constipation is resolved and blood sugar elevations after medication changes Chronic Disease Management support and education needs related to effective management of DM Lab Results  Component Value Date   HGBA1C 6.6 (A) 02/16/2023     Planned Interventions: Provided education to patient about basic DM disease process. The patient states her blood sugars have been bouncing around since she has not been taking her Ozempic. She is back taking Metformin 500 mg BID. She states that she is monitoring and her blood sugars are going up more after eating but then coming back down. She is more stable with the Ozempic but it causes her to have more GI issues so she is not taking the Ozempic . The patient did have a slightly elevated A1C at her last pcp visit but she feels she is doing well at managing her DM health and well being.  Reviewed medications with patient and discussed importance of medication adherence. The patient is  taking Metformin 500 mg BID. The patient is working with the pharm D as needed for effective management of her medications. She says she is doing better about not missing her medications. Talked to the patient about setting alarms on her phone as a reminder to take her medications but she does not want to do that right now. The patient is not as overwhelmed during this outreach. She feels she is more stable than she has been in a while. Reflective listening and support given. She wants the  provider to know that the [employer] 3 sensors are working the best of any and the first sensor stayed on 14 days and the 2nd one 10 and she has the 3rd sensor on. ;        Reviewed prescribed diet with patient heart healthy/ADA. Review and education provided. Education and support given ; Counseled on importance of regular laboratory monitoring as prescribed. Has regular labwork. Labs up to date. Slight trend up of A1C. Discussed plans with patient for ongoing care management follow up and provided patient with direct contact information for care management team;      Provided patient with written educational materials related to hypo and hyperglycemia and importance of correct treatment. Denies any issues with hypo or hyperglycemia. The patient denies any acute highs or lows right now. .;       Reviewed scheduled/upcoming provider appointments including: 05-20-2023 at 120 pm. Reminder given today;         Advised patient, providing education and rationale, to check cbg twice daily and when you have symptoms of low or high blood sugar and record. Currently the patient has a continuous reader. She states that it has been 100 to 324. She states fasting it is 120 to 130.  7 day average has been around 142 when saw the doctor last. Usually after eating it goes up to 200's. Review of goal of fasting is <130 and post prandial of <180.  She has the [employer] 3 and is now working on strips to go in that meter so she will just have one meter to keep up with.  Referral made to pharmacy team for assistance with PAP for DM medications. The patient is working with the pharm D for medication management and support Review of patient status, including review of consultants reports, relevant laboratory and other test results, and medications completed;       Advised patient to discuss changes in DM, questions, or concerns with provider;      Screening for signs and symptoms of depression related to chronic disease state;         Assessed social determinant of health barriers;  The patient has made positive changes in her habits and lifestyle. She feels like she is not as stressed. She says going to the senior center is helping her with her socialization and having a better relationship with her family as well. 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 Talked about self care.  Encouraged the patient to continue doing things she enjoys. Review and updates given. The patients son is currently in the hospital and she is concerned about him but he is doing better. He had to have his appendix out, now he is back in the hospital with problems with his gallbladder. She is going to day and "lay eyes on him". She is trying to focus on not overdoing activity and pacing her activity. She does not go outside when the temperatures outside are >85. The patient called today and she needed to change her appointment. Appointment changes to 06-23-2023 at 345 pm. She also ask about help with a SW referral. The patient states she needs to find a place to live as she lives with her brother and needs to move. Have sent a secure message to the scheduler to get the patient scheduled with SW. Will continue to monitor.    Symptom Management: Take medications as prescribed   Attend all scheduled provider appointments Call provider office for new concerns or questions  call the Suicide and Crisis Lifeline: 988 call the [place] National Suicide Prevention Lifeline: (386) 330-1637 or TTY: 956-472-9454 TTY (678)452-8602) to talk to a trained counselor call 1-800-273-TALK (toll free, 24 hour hotline) if experiencing a Mental Health or Behavioral Health Crisis  check feet daily for cuts, sores or redness enter blood sugar readings and medication or insulin into daily log take the blood sugar log to all doctor visits trim toenails straight across manage portion size keep feet up  while sitting wash and dry feet carefully every day wear comfortable, cotton socks wear comfortable, well-fitting shoes  Follow Up Plan: Telephone follow up appointment with care management team member scheduled for: 06-23-2023 at 345 pm           Our next appointment is by telephone on 06-23-2023 at 345 pm  Please call the care guide team at [phone] if you need to cancel or reschedule your appointment.   If you are experiencing a Mental Health or Behavioral Health Crisis or need someone to talk to, please call the Suicide and Crisis Lifeline: 988 call the [place] National Suicide Prevention Lifeline: 4081464716 or TTY: (979) 517-1056 TTY 986-835-7218) to talk to a trained counselor call 1-800-273-TALK (toll free, 24 hour hotline) go to [facility] [address] [place] [phone])   Patient verbalizes understanding of instructions  and care plan provided today and agrees to view in MyChart. Active MyChart status and patient understanding of how to access instructions and care plan via MyChart confirmed with patient.     Telephone follow up appointment with care management team member scheduled for:06-23-2023 at 345 pm  Alto Denver RN, MSN, CCM RN Care Manager  [facility]  Ambulatory Care Management  Direct Number: [phone]

## 2023-05-27 ENCOUNTER — Ambulatory Visit: Payer: 59 | Admitting: Family Medicine

## 2023-05-27 ENCOUNTER — Telehealth: Payer: Self-pay | Admitting: *Deleted

## 2023-05-27 NOTE — Progress Notes (Signed)
Care Coordination   Note   05/27/2023 Name: Tanya Hogan MRN: [id number] DOB: [birth year]  [name] is a 72 y.o. year old female who sees [name], Barbarann Ehlers, MD for primary care. I reached out to [name] by phone today to offer care coordination services.  Ms. Frothingham was given information about Care Coordination services today including:   The Care Coordination services include support from the care team which includes your Nurse Coordinator, Clinical Social Worker, or Pharmacist.  The Care Coordination team is here to help remove barriers to the health concerns and goals most important to you. Care Coordination services are voluntary, and the patient may decline or stop services at any time by request to their care team member.   Care Coordination Consent Status: Patient agreed to services and verbal consent obtained.   Follow up plan:  Telephone appointment with care coordination team member scheduled for:  06/14/2023  Encounter Outcome:  Patient Scheduled  Burman Nieves, [facility] Care Coordination Care Guide Direct Dial: [phone]

## 2023-05-27 NOTE — Progress Notes (Signed)
Care Coordination  Outreach Note  05/27/2023 Name: Tanya Hogan MRN: [id number] DOB: Mar 22, 1951   Care Coordination Outreach Attempts: An unsuccessful telephone outreach was attempted today to offer the patient information about available care coordination services.  Follow Up Plan:  Additional outreach attempts will be made to offer the patient care coordination information and services.   Encounter Outcome:  No Answer  Burman Nieves, CCMA Care Coordination Care Guide Direct Dial: [phone]

## 2023-05-28 DIAGNOSIS — G4733 Obstructive sleep apnea (adult) (pediatric): Secondary | ICD-10-CM | POA: Diagnosis not present

## 2023-05-28 DIAGNOSIS — G4721 Circadian rhythm sleep disorder, delayed sleep phase type: Secondary | ICD-10-CM | POA: Diagnosis not present

## 2023-05-28 DIAGNOSIS — J4540 Moderate persistent asthma, uncomplicated: Secondary | ICD-10-CM | POA: Diagnosis not present

## 2023-05-28 DIAGNOSIS — J849 Interstitial pulmonary disease, unspecified: Secondary | ICD-10-CM | POA: Diagnosis not present

## 2023-05-28 DIAGNOSIS — K7581 Nonalcoholic steatohepatitis (NASH): Secondary | ICD-10-CM | POA: Diagnosis not present

## 2023-05-28 DIAGNOSIS — I5082 Biventricular heart failure: Secondary | ICD-10-CM | POA: Diagnosis not present

## 2023-05-31 ENCOUNTER — Ambulatory Visit (INDEPENDENT_AMBULATORY_CARE_PROVIDER_SITE_OTHER): Payer: 59 | Admitting: Family Medicine

## 2023-05-31 ENCOUNTER — Encounter: Payer: Self-pay | Admitting: Family Medicine

## 2023-05-31 VITALS — BP 137/81 | HR 63 | Ht 61.0 in | Wt 216.2 lb

## 2023-05-31 DIAGNOSIS — L299 Pruritus, unspecified: Secondary | ICD-10-CM

## 2023-05-31 DIAGNOSIS — R21 Rash and other nonspecific skin eruption: Secondary | ICD-10-CM | POA: Diagnosis not present

## 2023-05-31 MED ORDER — TRIAMCINOLONE ACETONIDE 0.5 % EX CREA: 1.0000 | TOPICAL_CREAM | Freq: Two times a day (BID) | CUTANEOUS | 0 refills | Status: DC

## 2023-05-31 MED ORDER — METHYLPREDNISOLONE 4 MG PO TBPK
ORAL_TABLET | ORAL | 0 refills | Status: DC
Start: 2023-05-31 — End: 2023-06-09

## 2023-05-31 MED ORDER — HYDROXYZINE HCL 10 MG PO TABS
10.0000 mg | ORAL_TABLET | Freq: Every evening | ORAL | 0 refills | Status: DC | PRN
Start: 2023-05-31 — End: 2023-06-09

## 2023-05-31 NOTE — Assessment & Plan Note (Signed)
Faint rash noted on arms and palms. Pt has some itching on scalp-dandruff present - given medrol dose pack for itching along with recommendations to continue her allegra  - additionally, will go ahead and send in hydroxyzine for severe itching at night that is inhibiting sleep - recommend triamcinolone cream as needed if there are particular spots itching - follow up with pcp in one week

## 2023-05-31 NOTE — Assessment & Plan Note (Signed)
-   have ordered CMP to assess bile function to see if pruritus is related

## 2023-05-31 NOTE — Patient Instructions (Signed)
Continue taking your allegra ( I am not going to add zyrtec)   Take medrol dose pack as instructed starting today   Take hydroxyzine at night for itching

## 2023-05-31 NOTE — Progress Notes (Signed)
Acute Office Visit  Subjective:     Patient ID: Tanya Hogan, female    DOB: 02-20-1951, 72 y.o.   MRN: [id number]  Chief Complaint  Patient presents with   Rash    Pt states she woke up this morning with the rash and itching, she said she ate a po boy last night with oysters between 5-7pm    HPI Patient is in today for of rash. Says she woke up at 4:30am with scratching and around 6:30am she took a benadryl. She describes the rash as a "fine" rash. She does note an allergy to shumac and said one time it was placed in her tea a few years ago and caused a similar rash. She says she ate a [employer] at [employer] and made sure it didn't have any of her allergens present. Also notes pruritus in her hands. Rash localized to arms and hands. Pruritus on arms, palms and scalp.  Review of Systems  Constitutional:  Negative for chills and fever.  Respiratory:  Negative for cough and shortness of breath.   Cardiovascular:  Negative for chest pain.  Skin:  Positive for itching and rash.  Neurological:  Negative for headaches.        Objective:    BP 137/81 (BP Location: Left Arm, Patient Position: Sitting, Cuff Size: Large)   Pulse 63   Ht 5\' 1"  (1.549 m)   Wt 216 lb 4 oz (98.1 kg)   SpO2 97%   BMI 40.86 kg/m    Physical Exam Vitals and nursing note reviewed.  Constitutional:      General: She is not in acute distress.    Appearance: Normal appearance.  HENT:     Head: Normocephalic and atraumatic.     Right Ear: External ear normal.     Left Ear: External ear normal.     Nose: Nose normal.  Eyes:     Conjunctiva/sclera: Conjunctivae normal.  Cardiovascular:     Rate and Rhythm: Normal rate.  Pulmonary:     Effort: Pulmonary effort is normal.  Skin:    Findings: Rash present.     Comments: Faint rash with small red lesion present on arms. None on torso. Scalp has some erythema and dandruff  Neurological:     General: No focal deficit present.     Mental Status: She is alert  and oriented to person, place, and time.  Psychiatric:        Mood and Affect: Mood normal.        Behavior: Behavior normal.        Thought Content: Thought content normal.        Judgment: Judgment normal.     No results found for any visits on 05/31/23.      Assessment & Plan:   Problem List Items Addressed This Visit       Musculoskeletal and Integument   Rash    Faint rash noted on arms and palms. Pt has some itching on scalp-dandruff present - given medrol dose pack for itching along with recommendations to continue her allegra  - additionally, will go ahead and send in hydroxyzine for severe itching at night that is inhibiting sleep - recommend triamcinolone cream as needed if there are particular spots itching - follow up with pcp in one week      Relevant Medications   hydrOXYzine (ATARAX) 10 MG tablet   methylPREDNISolone (MEDROL DOSEPAK) 4 MG TBPK tablet   Pruritus - Primary    -  have ordered CMP to assess bile function to see if pruritus is related      Relevant Medications   hydrOXYzine (ATARAX) 10 MG tablet   methylPREDNISolone (MEDROL DOSEPAK) 4 MG TBPK tablet   Other Relevant Orders   CMP14+EGFR    Meds ordered this encounter  Medications   hydrOXYzine (ATARAX) 10 MG tablet    Sig: Take 1 tablet (10 mg total) by mouth at bedtime as needed for itching.    Dispense:  7 tablet    Refill:  0   methylPREDNISolone (MEDROL DOSEPAK) 4 MG TBPK tablet    Sig: Follow instructions on pill pack    Dispense:  21 tablet    Refill:  0   triamcinolone cream (KENALOG) 0.5 %    Sig: Apply 1 Application topically 2 (two) times daily. To affected areas.    Dispense:  30 g    Refill:  0    Return in about 1 week (around 06/07/2023) for dr Linford Arnold rash follow up.  Charlton Amor, DO

## 2023-06-01 DIAGNOSIS — G4733 Obstructive sleep apnea (adult) (pediatric): Secondary | ICD-10-CM | POA: Diagnosis not present

## 2023-06-01 LAB — CMP14+EGFR
ALT: 64 [IU]/L — ABNORMAL HIGH (ref 0–32)
AST: 72 [IU]/L — ABNORMAL HIGH (ref 0–40)
Albumin: 4.5 g/dL (ref 3.8–4.8)
Alkaline Phosphatase: 175 [IU]/L — ABNORMAL HIGH (ref 44–121)
BUN/Creatinine Ratio: 14 (ref 12–28)
BUN: 12 mg/dL (ref 8–27)
Bilirubin Total: 0.5 mg/dL (ref 0.0–1.2)
CO2: 25 mmol/L (ref 20–29)
Calcium: 10.3 mg/dL (ref 8.7–10.3)
Chloride: 101 mmol/L (ref 96–106)
Creatinine, Ser: 0.85 mg/dL (ref 0.57–1.00)
Globulin, Total: 2.5 g/dL (ref 1.5–4.5)
Glucose: 100 mg/dL — ABNORMAL HIGH (ref 70–99)
Potassium: 3.7 mmol/L (ref 3.5–5.2)
Sodium: 144 mmol/L (ref 134–144)
Total Protein: 7 g/dL (ref 6.0–8.5)
eGFR: 72 mL/min/{1.73_m2} (ref >59)

## 2023-06-03 ENCOUNTER — Other Ambulatory Visit: Payer: 59

## 2023-06-05 DIAGNOSIS — G4733 Obstructive sleep apnea (adult) (pediatric): Secondary | ICD-10-CM | POA: Diagnosis not present

## 2023-06-06 DIAGNOSIS — G4733 Obstructive sleep apnea (adult) (pediatric): Secondary | ICD-10-CM | POA: Diagnosis not present

## 2023-06-08 ENCOUNTER — Telehealth: Payer: Self-pay | Admitting: Family Medicine

## 2023-06-08 DIAGNOSIS — E038 Other specified hypothyroidism: Secondary | ICD-10-CM

## 2023-06-08 NOTE — Telephone Encounter (Signed)
[facility] called in about levothyroxine manufacture change from Amneal to Lupin. Pt is aware.   VQ:[id number] PPI:[id number]

## 2023-06-09 ENCOUNTER — Encounter: Payer: Self-pay | Admitting: Family Medicine

## 2023-06-09 ENCOUNTER — Telehealth: Payer: Self-pay | Admitting: Family Medicine

## 2023-06-09 ENCOUNTER — Ambulatory Visit (INDEPENDENT_AMBULATORY_CARE_PROVIDER_SITE_OTHER): Payer: 59 | Admitting: Family Medicine

## 2023-06-09 VITALS — BP 129/55 | HR 85 | Ht 61.0 in | Wt 217.0 lb

## 2023-06-09 DIAGNOSIS — R1906 Epigastric swelling, mass or lump: Secondary | ICD-10-CM | POA: Diagnosis not present

## 2023-06-09 DIAGNOSIS — E038 Other specified hypothyroidism: Secondary | ICD-10-CM | POA: Diagnosis not present

## 2023-06-09 DIAGNOSIS — K581 Irritable bowel syndrome with constipation: Secondary | ICD-10-CM

## 2023-06-09 DIAGNOSIS — K5900 Constipation, unspecified: Secondary | ICD-10-CM

## 2023-06-09 DIAGNOSIS — I10 Essential (primary) hypertension: Secondary | ICD-10-CM | POA: Diagnosis not present

## 2023-06-09 DIAGNOSIS — K7581 Nonalcoholic steatohepatitis (NASH): Secondary | ICD-10-CM | POA: Diagnosis not present

## 2023-06-09 NOTE — Progress Notes (Signed)
Established Patient Office Visit  Subjective   Patient ID: Tanya Hogan, female    DOB: 06-20-1951  Age: 72 y.o. MRN: [id number]  Chief Complaint  Patient presents with   Rash    HPI She says for the last couple days she has not had a bowel movement and she is felt more bloated.  She is been taking her Colace.  This morning she did take 2 Dulcolax but has not had any movement yet.  She is not even really passing gas she feels just a lot of tightness and fullness particularly in the epigastric area.  She is also reporting feeling tired the last couple days and just a little bit more irritable.  She also feels like it has been harder to focus and remember for the last 2 days.  She just finished up her steroids for the rash on her arm. She woke up with sudden  itchg and fine rash around 10/7.  Liver function was checked and it was stable.    F/Y for thyroidism-We added half a tab on Sundays about 6 weeks. Ago.    She did follow-up with sleep medicine last week.  And says she has a breathing test scheduled for next week.    ROS    Objective:     BP (!) 129/55   Pulse 85   Ht 5\' 1"  (1.549 m)   Wt 217 lb (98.4 kg)   SpO2 94%   BMI 41.00 kg/m    Physical Exam Vitals and nursing note reviewed.  Constitutional:      Appearance: Normal appearance.  HENT:     Head: Normocephalic and atraumatic.  Eyes:     Conjunctiva/sclera: Conjunctivae normal.  Cardiovascular:     Rate and Rhythm: Normal rate and regular rhythm.  Pulmonary:     Effort: Pulmonary effort is normal.     Breath sounds: Normal breath sounds.  Abdominal:     General: Bowel sounds are normal. There is distension.     Palpations: Abdomen is soft. There is no mass.     Tenderness: There is no abdominal tenderness.  Skin:    General: Skin is warm and dry.  Neurological:     Mental Status: She is alert.  Psychiatric:        Mood and Affect: Mood normal.      No results found for any visits on  06/09/23.    The ASCVD Risk score (Arnett DK, et al., 2019) failed to calculate for the following reasons:   The patient has a prior MI or stroke diagnosis    Assessment & Plan:   Problem List Items Addressed This Visit       Cardiovascular and Mediastinum   Essential hypertension   Relevant Medications   EPIPEN 2-PAK 0.3 MG/0.3ML SOAJ injection   Other Relevant Orders   CMP14+EGFR   VITAMIN D 25 Hydroxy (Vit-D Deficiency, Fractures)   TSH + free T4     Digestive   NASH (nonalcoholic steatohepatitis)   Relevant Orders   CMP14+EGFR   VITAMIN D 25 Hydroxy (Vit-D Deficiency, Fractures)   TSH + free T4   Irritable bowel syndrome with constipation     Endocrine   Hypothyroidism - Primary    Recheck TSH. We added half a tab on Sundays about 6 weeks. Ago.        Relevant Orders   CMP14+EGFR   VITAMIN D 25 Hydroxy (Vit-D Deficiency, Fractures)   TSH + free T4  Other Visit Diagnoses     Epigastric fullness       Relevant Orders   CMP14+EGFR   VITAMIN D 25 Hydroxy (Vit-D Deficiency, Fractures)   TSH + free T4   Constipation, unspecified constipation type       Relevant Orders   CMP14+EGFR   VITAMIN D 25 Hydroxy (Vit-D Deficiency, Fractures)   TSH + free T4      Constipation with epigastric fullness-continue with Colace.  Okay to repeat dosing this afternoon.  Make sure to drink plenty of water today and eat high-fiber such as a salad today.  Call if any problems or concerns.   No follow-ups on file.    Nani Gasser, MD

## 2023-06-09 NOTE — Telephone Encounter (Signed)
[facility] advised. Patient is aware to recheck TSH 6 weeks after starting the levothyroxine with the new manufacturer.

## 2023-06-09 NOTE — Telephone Encounter (Signed)
Patient: She will need to have a repeat TSH drawn in 6 weeks after she starts the new generic.

## 2023-06-09 NOTE — Assessment & Plan Note (Signed)
Recheck TSH. We added half a tab on Sundays about 6 weeks. Ago.

## 2023-06-09 NOTE — Telephone Encounter (Signed)
[facility] called they're following up on Levothyroxine they're asking if they can get a manufacturer change to be approved

## 2023-06-09 NOTE — Telephone Encounter (Signed)
OK to change. Pt will need repeat labs in 6 weeks after starts new pill.

## 2023-06-10 LAB — TSH+FREE T4
Free T4: 2.13 ng/dL — ABNORMAL HIGH (ref 0.82–1.77)
TSH: 2.47 u[IU]/mL (ref 0.450–4.500)

## 2023-06-10 LAB — VITAMIN D 25 HYDROXY (VIT D DEFICIENCY, FRACTURES): Vit D, 25-Hydroxy: 44.1 ng/mL (ref 30.0–100.0)

## 2023-06-10 LAB — CMP14+EGFR
ALT: 55 [IU]/L — ABNORMAL HIGH (ref 0–32)
AST: 59 [IU]/L — ABNORMAL HIGH (ref 0–40)
Albumin: 4.2 g/dL (ref 3.8–4.8)
Alkaline Phosphatase: 144 [IU]/L — ABNORMAL HIGH (ref 44–121)
BUN/Creatinine Ratio: 20 (ref 12–28)
BUN: 19 mg/dL (ref 8–27)
Bilirubin Total: 0.4 mg/dL (ref 0.0–1.2)
CO2: 20 mmol/L (ref 20–29)
Calcium: 10 mg/dL (ref 8.7–10.3)
Chloride: 99 mmol/L (ref 96–106)
Creatinine, Ser: 0.94 mg/dL (ref 0.57–1.00)
Globulin, Total: 2.7 g/dL (ref 1.5–4.5)
Glucose: 111 mg/dL — ABNORMAL HIGH (ref 70–99)
Potassium: 3.8 mmol/L (ref 3.5–5.2)
Sodium: 140 mmol/L (ref 134–144)
Total Protein: 6.9 g/dL (ref 6.0–8.5)
eGFR: 64 mL/min/{1.73_m2} (ref >59)

## 2023-06-10 NOTE — Progress Notes (Signed)
Hi Tanya Hogan, liver function is still elevated but it does look a little bit better the AST is come down a little bit and the ALT has come down a little bit as well as the alkaline phosphatase which is great.  Continue to work on those healthy food choices and staying active.  The TSH looks better this time at 2.4 compared to a month ago so that looks great.  Vitamin D is normal.  Let us know when you had your last eye exam so that we can get your chart updated it looks like you are due right around this time a year for your exam.

## 2023-06-14 ENCOUNTER — Ambulatory Visit: Payer: Self-pay | Admitting: Licensed Clinical Social Worker

## 2023-06-14 NOTE — Patient Outreach (Signed)
Care Coordination   Initial Visit Note   06/14/2023 Name: NATICIA MEAD MRN: [id number] DOB: 09/22/1950  ULANA GESUALDI is a 72 y.o. year old female who sees [name], Barbarann Ehlers, MD for primary care. I spoke with  [name] by phone today.  What matters to the patients health and wellness today? Patient has stress in managing medical needs     Goals Addressed             This Visit's Progress    Patient has stress in managing medical needs       Interventions:  Spoke with client about client needs.  Spoke with client about medication procurement Spoke with Palak about her walking challenges. She uses a [place] to help her walk She said she has difficulty holding objects (keys, liquids, may loose grip occasionally) Spoke of vision of client. She uses glasses to help her with vision Discussed support with PCP Discussed insurance of client. Discussed Dual complete coverage. She has [employer] Card to use for food assistance.   Discussed transportation of client. She has vehicle she drives. Discussed food procurement for client.  She has difficulty in getting groceries.   Provided counseling support for client Discussed edema issues of client Used Active Listening techniques to allow client to discuss her feelings and concerns Client said she fatigues easily. Client said it is difficult to manage home cleaning due to decreased energy level Discussed program support with RN, LCSW, Pharmacist Discussed exercise of client. She does walking exercises Discussed sleeping issues of client Discussed equipment needs of client Discussed care needs of her brother. She resides with her brother. Her brother has Parkinson's diagnoses and she helps with care needs of her brother.  Discussed financial needs of client. She makes monthly payments to pay down medical bills Discussed relaxation activities. She plays card games and enjoys games Encouraged client to call LCSW for SW support for  client at [phone].          SDOH assessments and interventions completed:  Yes  SDOH Interventions Today    Flowsheet Row Most Recent Value  SDOH Interventions   Depression Interventions/Treatment  Counseling  Physical Activity Interventions Other (Comments)  [uses a cane to help her walk]  Stress Interventions Provide Counseling  [client has stress in managing medical needs]        Care Coordination Interventions:  Yes, provided   Interventions Today    Flowsheet Row Most Recent Value  Chronic Disease   Chronic disease during today's visit Other  [spoke with client about client needs]  General Interventions   General Interventions Discussed/Reviewed General Interventions Discussed, Community Resources  Exercise Interventions   Exercise Discussed/Reviewed Physical Activity  [uses a cane to help her walk]  Physical Activity Discussed/Reviewed Physical Activity Discussed  Mental Health Interventions   Mental Health Discussed/Reviewed Coping Strategies  [discussed coping skills to manage stress]  Nutrition Interventions   Nutrition Discussed/Reviewed Nutrition Discussed  Pharmacy Interventions   Pharmacy Dicussed/Reviewed Pharmacy Topics Discussed  Safety Interventions   Safety Discussed/Reviewed Fall Risk       Follow up plan: Follow up call scheduled for 07/12/23 at 10:30 AM    Encounter Outcome:  Patient Visit Completed   Kelton Pillar.Encarnacion Scioneaux MSW, LCSW Licensed Visual merchandiser [facility] Care Management [phone]

## 2023-06-14 NOTE — Patient Instructions (Signed)
Visit Information  Thank you for taking time to visit with me today. Please don't hesitate to contact me if I can be of assistance to you.   Following are the goals we discussed today:   Goals Addressed             This Visit's Progress    Patient has stress in managing medical needs       Interventions:  Spoke with client about client needs.  Spoke with client about medication procurement Spoke with Nakshatra about her walking challenges. She uses a [place] to help her walk She said she has difficulty holding objects (keys, liquids, may loose grip occasionally) Spoke of vision of client. She uses glasses to help her with vision Discussed support with PCP Discussed insurance of client. Discussed Dual complete coverage. She has [employer] Card to use for food assistance.   Discussed transportation of client. She has vehicle she drives. Discussed food procurement for client.  She has difficulty in getting groceries.   Provided counseling support for client Discussed edema issues of client Used Active Listening techniques to allow client to discuss her feelings and concerns Client said she fatigues easily. Client said it is difficult to manage home cleaning due to decreased energy level Discussed program support with RN, LCSW, Pharmacist Discussed exercise of client. She does walking exercises Discussed sleeping issues of client Discussed equipment needs of client Discussed care needs of her brother. She resides with her brother. Her brother has Parkinson's diagnoses and she helps with care needs of her brother.  Discussed financial needs of client. She makes monthly payments to pay down medical bills Discussed relaxation activities. She plays card games and enjoys games Encouraged client to call LCSW for SW support for client at [phone].          Our next appointment is by telephone on 07/12/23 at 10:30 AM   Please call the care guide team at [phone] if you need to cancel or  reschedule your appointment.   If you are experiencing a Mental Health or Behavioral Health Crisis or need someone to talk to, please go to [facility] [address], [place] [phone])   The patient verbalized understanding of instructions, educational materials, and care plan provided today and DECLINED offer to receive copy of patient instructions, educational materials, and care plan.   The patient has been provided with contact information for the care management team and has been advised to call with any health related questions or concerns.   Kelton Pillar.Leonor Darnell MSW, LCSW Licensed Visual merchandiser [facility] Care Management [phone]

## 2023-06-17 DIAGNOSIS — G4733 Obstructive sleep apnea (adult) (pediatric): Secondary | ICD-10-CM | POA: Diagnosis not present

## 2023-06-17 DIAGNOSIS — I5082 Biventricular heart failure: Secondary | ICD-10-CM | POA: Diagnosis not present

## 2023-06-21 DIAGNOSIS — K9289 Other specified diseases of the digestive system: Secondary | ICD-10-CM | POA: Diagnosis not present

## 2023-06-21 DIAGNOSIS — K7401 Hepatic fibrosis, early fibrosis: Secondary | ICD-10-CM | POA: Diagnosis not present

## 2023-06-21 DIAGNOSIS — Z87891 Personal history of nicotine dependence: Secondary | ICD-10-CM | POA: Diagnosis not present

## 2023-06-21 DIAGNOSIS — G4733 Obstructive sleep apnea (adult) (pediatric): Secondary | ICD-10-CM | POA: Diagnosis not present

## 2023-06-21 DIAGNOSIS — K529 Noninfective gastroenteritis and colitis, unspecified: Secondary | ICD-10-CM | POA: Diagnosis not present

## 2023-06-21 DIAGNOSIS — I5082 Biventricular heart failure: Secondary | ICD-10-CM | POA: Diagnosis not present

## 2023-06-21 DIAGNOSIS — K7581 Nonalcoholic steatohepatitis (NASH): Secondary | ICD-10-CM | POA: Diagnosis not present

## 2023-06-21 DIAGNOSIS — Z860100 Personal history of colon polyps, unspecified: Secondary | ICD-10-CM | POA: Diagnosis not present

## 2023-06-21 DIAGNOSIS — K581 Irritable bowel syndrome with constipation: Secondary | ICD-10-CM | POA: Diagnosis not present

## 2023-06-23 ENCOUNTER — Other Ambulatory Visit: Payer: Self-pay | Admitting: *Deleted

## 2023-06-23 ENCOUNTER — Encounter: Payer: Self-pay | Admitting: *Deleted

## 2023-06-23 ENCOUNTER — Other Ambulatory Visit: Payer: Self-pay

## 2023-06-23 NOTE — Patient Instructions (Signed)
Visit Information  Thank you for taking time to visit with me today. Please don't hesitate to contact me if I can be of assistance to you before our next scheduled telephone appointment.  Following are the goals we discussed today:   Goals Addressed             This Visit's Progress    RNCM Care Management Expected Outcome:  Monitor, Self-Manage and Reduce Symptoms of Diabetes       Current Barriers:  Knowledge Deficits related to constipation and having to change medications to see if constipation is resolved and blood sugar elevations after medication changes Chronic Disease Management support and education needs related to effective management of DM Lab Results  Component Value Date   HGBA1C 6.4 (A) 05/20/2023     Planned Interventions: Provided education to patient about basic DM disease process. The patient states her blood sugars have been up and down due to it being Halloween and notes that the lowest blood sugar is 69 and the highest blood sugar being 210. Reviewed medications with patient and discussed importance of medication adherence.        Reviewed prescribed diet with patient heart healthy/ADA. Review and education provided. Education and support given ; Counseled on importance of regular laboratory monitoring as prescribed. Has regular labwork. Labs up to date. Slight trend up of A1C. Discussed plans with patient for ongoing care management follow up and provided patient with direct contact information for care management team;      Provided patient with written educational materials related to hypo and hyperglycemia and importance of correct treatment. Denies any issues with hypo or hyperglycemia. The patient denies any acute highs or lows right now. .;       Reviewed scheduled/upcoming provider appointments  Advised patient, providing education and rationale, to check cbg twice daily and when you have symptoms of low or high blood sugar and record. Currently the patient  has a continuous reader. Review of goal of fasting is <130 and post prandial of <180.   Referral made to pharmacy team for assistance with PAP for DM medications. The patient is working with the pharm D for medication management and support Review of patient status, including review of consultants reports, relevant laboratory and other test results, and medications completed;       Advised patient to discuss changes in DM, questions, or concerns with provider;      Screening for signs and symptoms of depression related to chronic disease state;        Assessed social determinant of health barriers;  The patient has made positive changes in her habits and lifestyle. She feels like she is not as stressed. She says going to the senior center is helping her with her socialization and having a better relationship with her family as well. 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 Talked about self care.  Encouraged the patient to continue doing things she enjoys. Stated she was currently playing Mahjong with friends. States she does not have any depression. RNCM notified that care would be transitioned over to new [facility] [name] [phone].   Symptom Management: Take medications as prescribed   Attend all scheduled provider appointments Call provider office for new concerns or questions  call the Suicide and Crisis Lifeline: 988 call the [place] National Suicide Prevention Lifeline: 2255343051 or TTY: (347)556-4017 TTY 770-326-6057) to talk to a  trained counselor call 1-800-273-TALK (toll free, 24 hour hotline) if experiencing a Mental Health or Behavioral Health Crisis  check feet daily for cuts, sores or redness enter blood sugar readings and medication or insulin into daily log take the blood sugar log to all doctor visits trim toenails straight across manage portion size keep feet up while sitting wash and dry  feet carefully every day wear comfortable, cotton socks wear comfortable, well-fitting shoes  Follow Up Plan: Telephone follow up appointment with care management team member scheduled for: 08-05-2023 at 315 pm       [facility] Care Management Expected Outcome:  Monitor, Self-Manage and Reduce Symptoms of Heart Failure       Current Barriers:  Knowledge Deficits related to the biventricular pacing system and the generator changes and fully understanding the pacing system and how it works Chronic Disease Management support and education needs related to effective management of HF EF% 20-25 % in May of 2023, 08-29-2022 EF 25 to 30%  Pacemaker generator setting changed in May of 2023. During hospitalization they discussed her upgrading her pacemaker to an ICD- the patient is unsure if she wants to do this Post discharge from the hospital for SVT 08-28-2022 to 09-03-2022, post discharge from the hospital 10-29-2022 to 11-01-2022  Wt Readings from Last 3 Encounters:  06/09/23 217 lb (98.4 kg)  05/31/23 216 lb 4 oz (98.1 kg)  05/20/23 215 lb (97.5 kg)    Planned Interventions: Basic overview and discussion of pathophysiology of Heart Failure reviewed. She has made positive dietary changes that help with hidden sodium. The patient denies any acute changes in her HF or management of her HF. She says the extra fluid pill has been significant in helping her maintain control of her HF. Education provided.  Provided education on low sodium diet/ADA. Education and review. The patient is compliant with dietary restrictions. She has had issues in the past with hyperkalemia and she is mindful of this. Education on monitoring sodium content of foods closely.   Reviewed Heart Failure Action Plan in depth and provided written copy.Review with the patient the plan of care for changes in her weight.  Assessed need for readable accurate scales in home. She is weighing daily and she is monitoring closely for changes. She is in  close contact with the HF clinic and has the number to call them.   Provided education about placing scale on hard, flat surface Advised patient to weigh each morning after emptying bladder Discussed importance of daily weight and advised patient to weigh and record daily. The patient is compliant with weighing at this time.  She has to sleep with 3 pillows and she easily gets short of breath but states her oxygen stays up. She has a new sims card for her bipap machine and is using it now at night.  Reviewed role of diuretics in prevention of fluid overload and management of heart failure. The patient has parameters and is taking her Bumex as instructed.The patient is in much better spirits today and feels a lot better.  Discussed the importance of keeping all appointments with provider. Provided patient with education about the role of exercise in the management of heart failure. The patient is going to the senior center 3 days a week and doing exercises there. She likes this better than what she was doing at the [facility]. She works out about 45 minutes each time she goes. She is also doing mahjong, a game she had been wanting to learn  to play. She says the socialization and activity have been a positive thing for her. She is optimistic and taking it "one day at a time". The patient wants to do what she can to take care of herself and her chronic conditions. She is thankful for new things she is doing and the support of her family as well. Advised patient to discuss changes in HF or other  with provider Screening for signs and symptoms of depression related to chronic disease state  Assessed social determinant of health barriers  Symptom Management: Take medications as prescribed   Attend all scheduled provider appointments Call provider office for new concerns or questions  call the Suicide and Crisis Lifeline: 988 call the [place] National Suicide Prevention Lifeline: (564) 189-5897 or TTY: 571-528-5646  TTY 804-730-2836) to talk to a trained counselor call 1-800-273-TALK (toll free, 24 hour hotline) if experiencing a Mental Health or Behavioral Health Crisis  call office if I gain more than 2 pounds in one day or 5 pounds in one week keep legs up while sitting track weight in diary use salt in moderation watch for swelling in feet, ankles and legs every day weigh myself daily begin a heart failure diary bring diary to all appointments develop a rescue plan follow rescue plan if symptoms flare-up track symptoms and what helps feel better or worse dress right for the weather, hot or cold  Follow Up Plan: Telephone follow up appointment with care management team member scheduled for: 08-05-2023 at 315 pm           Our next appointment is by telephone on 08-05-2023 at 3:15 pm  Please call the care guide team at [phone] if you need to cancel or reschedule your appointment.   If you are experiencing a Mental Health or Behavioral Health Crisis or need someone to talk to, please call the Suicide and Crisis Lifeline: 988 call the [place] National Suicide Prevention Lifeline: 843 799 3370 or TTY: (678) 069-3322 TTY (209)773-5134) to talk to a trained counselor call 1-800-273-TALK (toll free, 24 hour hotline) call 911   Patient verbalizes understanding of instructions and care plan provided today and agrees to view in MyChart. Active MyChart status and patient understanding of how to access instructions and care plan via MyChart confirmed with patient.     Telephone follow up appointment with care management team member scheduled for: 08-05-2023 at 3:15 pm  Danise Edge, BSN RN RN Care Manager  [facility]  Ambulatory Care Management  Direct Number: [phone]

## 2023-06-23 NOTE — Patient Outreach (Signed)
Care Management   Visit Note  06/23/2023 Name: Tanya Hogan MRN: [id number] DOB: 1950/10/12  Subjective: Tanya Hogan is a 72 y.o. year old female who is a primary care patient of [name], MD. The Care Management team was consulted for assistance.      Engaged with patient spoke with patient by telephone.    Goals Addressed             This Visit's Progress    RNCM Care Management Expected Outcome:  Monitor, Self-Manage and Reduce Symptoms of Diabetes       Current Barriers:  Knowledge Deficits related to constipation and having to change medications to see if constipation is resolved and blood sugar elevations after medication changes Chronic Disease Management support and education needs related to effective management of DM Lab Results  Component Value Date   HGBA1C 6.4 (A) 05/20/2023     Planned Interventions: Provided education to patient about basic DM disease process. The patient states her blood sugars have been up and down due to it being Halloween and notes that the lowest blood sugar is 69 and the highest blood sugar being 210. Reviewed medications with patient and discussed importance of medication adherence.        Reviewed prescribed diet with patient heart healthy/ADA. Review and education provided. Education and support given ; Counseled on importance of regular laboratory monitoring as prescribed. Has regular labwork. Labs up to date. Slight trend up of A1C. Discussed plans with patient for ongoing care management follow up and provided patient with direct contact information for care management team;      Provided patient with written educational materials related to hypo and hyperglycemia and importance of correct treatment. Denies any issues with hypo or hyperglycemia. The patient denies any acute highs or lows right now. .;       Reviewed scheduled/upcoming provider appointments  Advised patient, providing education and rationale, to check  cbg twice daily and when you have symptoms of low or high blood sugar and record. Currently the patient has a continuous reader. Review of goal of fasting is <130 and post prandial of <180.   Referral made to pharmacy team for assistance with PAP for DM medications. The patient is working with the pharm D for medication management and support Review of patient status, including review of consultants reports, relevant laboratory and other test results, and medications completed;       Advised patient to discuss changes in DM, questions, or concerns with provider;      Screening for signs and symptoms of depression related to chronic disease state;        Assessed social determinant of health barriers;  The patient has made positive changes in her habits and lifestyle. She feels like she is not as stressed. She says going to the senior center is helping her with her socialization and having a better relationship with her family as well. 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 Talked about self care.  Encouraged the patient to continue doing things she enjoys. Stated she was currently playing Mahjong with friends. States she does not have any depression. RNCM notified that care would be transitioned over to new [facility], [name] [phone].   Symptom Management: Take medications as prescribed   Attend all scheduled provider appointments Call provider office for new concerns or questions  call the Suicide and  Crisis Lifeline: 988 call the [place] National Suicide Prevention Lifeline: (438)271-3333 or TTY: 843-116-7636 TTY (405) 172-2593) to talk to a trained counselor call 1-800-273-TALK (toll free, 24 hour hotline) if experiencing a Mental Health or Behavioral Health Crisis  check feet daily for cuts, sores or redness enter blood sugar readings and medication or insulin into daily log take the blood sugar log to all  doctor visits trim toenails straight across manage portion size keep feet up while sitting wash and dry feet carefully every day wear comfortable, cotton socks wear comfortable, well-fitting shoes  Follow Up Plan: Telephone follow up appointment with care management team member scheduled for: 08-05-2023 at 315 pm       [facility] Care Management Expected Outcome:  Monitor, Self-Manage and Reduce Symptoms of Heart Failure       Current Barriers:  Knowledge Deficits related to the biventricular pacing system and the generator changes and fully understanding the pacing system and how it works Chronic Disease Management support and education needs related to effective management of HF EF% 20-25 % in May of 2023, 08-29-2022 EF 25 to 30%  Pacemaker generator setting changed in May of 2023. During hospitalization they discussed her upgrading her pacemaker to an ICD- the patient is unsure if she wants to do this Post discharge from the hospital for SVT 08-28-2022 to 09-03-2022, post discharge from the hospital 10-29-2022 to 11-01-2022  Wt Readings from Last 3 Encounters:  06/09/23 217 lb (98.4 kg)  05/31/23 216 lb 4 oz (98.1 kg)  05/20/23 215 lb (97.5 kg)    Planned Interventions: Basic overview and discussion of pathophysiology of Heart Failure reviewed. She has made positive dietary changes that help with hidden sodium. The patient denies any acute changes in her HF or management of her HF. She says the extra fluid pill has been significant in helping her maintain control of her HF. Education provided.  Provided education on low sodium diet/ADA. Education and review. The patient is compliant with dietary restrictions. She has had issues in the past with hyperkalemia and she is mindful of this. Education on monitoring sodium content of foods closely.   Reviewed Heart Failure Action Plan in depth and provided written copy.Review with the patient the plan of care for changes in her weight.  Assessed need for  readable accurate scales in home. She is weighing daily and she is monitoring closely for changes. She is in close contact with the HF clinic and has the number to call them.   Provided education about placing scale on hard, flat surface Advised patient to weigh each morning after emptying bladder Discussed importance of daily weight and advised patient to weigh and record daily. The patient is compliant with weighing at this time.  She has to sleep with 3 pillows and she easily gets short of breath but states her oxygen stays up. She has a new sims card for her bipap machine and is using it now at night.  Reviewed role of diuretics in prevention of fluid overload and management of heart failure. The patient has parameters and is taking her Bumex as instructed.The patient is in much better spirits today and feels a lot better.  Discussed the importance of keeping all appointments with provider. Provided patient with education about the role of exercise in the management of heart failure. The patient is going to the senior center 3 days a week and doing exercises there. She likes this better than what she was doing at the [facility]. She works out  about 45 minutes each time she goes. She is also doing mahjong, a game she had been wanting to learn to play. She says the socialization and activity have been a positive thing for her. She is optimistic and taking it "one day at a time". The patient wants to do what she can to take care of herself and her chronic conditions. She is thankful for new things she is doing and the support of her family as well. Advised patient to discuss changes in HF or other  with provider Screening for signs and symptoms of depression related to chronic disease state  Assessed social determinant of health barriers  Symptom Management: Take medications as prescribed   Attend all scheduled provider appointments Call provider office for new concerns or questions  call the Suicide and  Crisis Lifeline: 988 call the [place] National Suicide Prevention Lifeline: (956)702-6143 or TTY: 307 167 5236 TTY 3512217581) to talk to a trained counselor call 1-800-273-TALK (toll free, 24 hour hotline) if experiencing a Mental Health or Behavioral Health Crisis  call office if I gain more than 2 pounds in one day or 5 pounds in one week keep legs up while sitting track weight in diary use salt in moderation watch for swelling in feet, ankles and legs every day weigh myself daily begin a heart failure diary bring diary to all appointments develop a rescue plan follow rescue plan if symptoms flare-up track symptoms and what helps feel better or worse dress right for the weather, hot or cold  Follow Up Plan: Telephone follow up appointment with care management team member scheduled for: 08-05-2023 at 315 pm              Consent to Services:  Patient was given information about care management services, agreed to services, and gave verbal consent to participate.   Plan: Telephone follow up appointment with care management team member scheduled for: 08-05-2023 at 3:15 pm  Danise Edge, BSN RN RN Care Manager  [facility]  Ambulatory Care Management  Direct Number: [phone]

## 2023-06-29 ENCOUNTER — Encounter (INDEPENDENT_AMBULATORY_CARE_PROVIDER_SITE_OTHER): Payer: Self-pay

## 2023-06-30 ENCOUNTER — Telehealth: Payer: Self-pay | Admitting: Family Medicine

## 2023-06-30 DIAGNOSIS — G4737 Central sleep apnea in conditions classified elsewhere: Secondary | ICD-10-CM | POA: Diagnosis not present

## 2023-06-30 DIAGNOSIS — G4733 Obstructive sleep apnea (adult) (pediatric): Secondary | ICD-10-CM | POA: Diagnosis not present

## 2023-06-30 NOTE — Telephone Encounter (Signed)
Pt states that she keeps getting a my chart telling her she is due for her eye exam but she just had one completed last month and got new glasses. She states she went to the [facility] Dr a couple buildings up from our office and I think the name of the office is [facility] and she states the Dr's name was Dr Neale Burly. Can you please update her record so she will stop getting this notification in her mychart to get an eye exam?

## 2023-07-01 DIAGNOSIS — J849 Interstitial pulmonary disease, unspecified: Secondary | ICD-10-CM | POA: Diagnosis not present

## 2023-07-05 ENCOUNTER — Telehealth: Payer: Self-pay | Admitting: Family Medicine

## 2023-07-05 DIAGNOSIS — K8020 Calculus of gallbladder without cholecystitis without obstruction: Secondary | ICD-10-CM | POA: Diagnosis not present

## 2023-07-05 DIAGNOSIS — K7581 Nonalcoholic steatohepatitis (NASH): Secondary | ICD-10-CM | POA: Diagnosis not present

## 2023-07-05 DIAGNOSIS — K760 Fatty (change of) liver, not elsewhere classified: Secondary | ICD-10-CM | POA: Diagnosis not present

## 2023-07-05 DIAGNOSIS — R160 Hepatomegaly, not elsewhere classified: Secondary | ICD-10-CM | POA: Diagnosis not present

## 2023-07-05 NOTE — Telephone Encounter (Signed)
Insurance called they needed information on patient for [place]

## 2023-07-08 DIAGNOSIS — I4729 Other ventricular tachycardia: Secondary | ICD-10-CM | POA: Diagnosis not present

## 2023-07-08 DIAGNOSIS — R000 Tachycardia, unspecified: Secondary | ICD-10-CM | POA: Diagnosis not present

## 2023-07-08 DIAGNOSIS — M810 Age-related osteoporosis without current pathological fracture: Secondary | ICD-10-CM | POA: Diagnosis not present

## 2023-07-08 DIAGNOSIS — R0789 Other chest pain: Secondary | ICD-10-CM | POA: Diagnosis not present

## 2023-07-08 DIAGNOSIS — R55 Syncope and collapse: Secondary | ICD-10-CM | POA: Diagnosis not present

## 2023-07-08 DIAGNOSIS — J4540 Moderate persistent asthma, uncomplicated: Secondary | ICD-10-CM | POA: Diagnosis not present

## 2023-07-08 DIAGNOSIS — M797 Fibromyalgia: Secondary | ICD-10-CM | POA: Diagnosis not present

## 2023-07-08 DIAGNOSIS — E039 Hypothyroidism, unspecified: Secondary | ICD-10-CM | POA: Diagnosis not present

## 2023-07-08 DIAGNOSIS — J4489 Other specified chronic obstructive pulmonary disease: Secondary | ICD-10-CM | POA: Diagnosis not present

## 2023-07-08 DIAGNOSIS — Z96611 Presence of right artificial shoulder joint: Secondary | ICD-10-CM | POA: Diagnosis not present

## 2023-07-08 DIAGNOSIS — E876 Hypokalemia: Secondary | ICD-10-CM | POA: Diagnosis not present

## 2023-07-08 DIAGNOSIS — Z515 Encounter for palliative care: Secondary | ICD-10-CM | POA: Diagnosis not present

## 2023-07-08 DIAGNOSIS — R0602 Shortness of breath: Secondary | ICD-10-CM | POA: Diagnosis not present

## 2023-07-08 DIAGNOSIS — I472 Ventricular tachycardia, unspecified: Secondary | ICD-10-CM | POA: Diagnosis not present

## 2023-07-08 DIAGNOSIS — I2510 Atherosclerotic heart disease of native coronary artery without angina pectoris: Secondary | ICD-10-CM | POA: Diagnosis not present

## 2023-07-08 DIAGNOSIS — Z87891 Personal history of nicotine dependence: Secondary | ICD-10-CM | POA: Diagnosis not present

## 2023-07-08 DIAGNOSIS — Z79899 Other long term (current) drug therapy: Secondary | ICD-10-CM | POA: Diagnosis not present

## 2023-07-08 DIAGNOSIS — R058 Other specified cough: Secondary | ICD-10-CM | POA: Diagnosis not present

## 2023-07-08 DIAGNOSIS — I5022 Chronic systolic (congestive) heart failure: Secondary | ICD-10-CM | POA: Diagnosis not present

## 2023-07-08 DIAGNOSIS — R110 Nausea: Secondary | ICD-10-CM | POA: Diagnosis not present

## 2023-07-08 DIAGNOSIS — E118 Type 2 diabetes mellitus with unspecified complications: Secondary | ICD-10-CM | POA: Diagnosis not present

## 2023-07-08 DIAGNOSIS — R079 Chest pain, unspecified: Secondary | ICD-10-CM | POA: Diagnosis not present

## 2023-07-08 DIAGNOSIS — Z95810 Presence of automatic (implantable) cardiac defibrillator: Secondary | ICD-10-CM | POA: Diagnosis not present

## 2023-07-08 DIAGNOSIS — I428 Other cardiomyopathies: Secondary | ICD-10-CM | POA: Diagnosis not present

## 2023-07-08 DIAGNOSIS — R42 Dizziness and giddiness: Secondary | ICD-10-CM | POA: Diagnosis not present

## 2023-07-08 DIAGNOSIS — I493 Ventricular premature depolarization: Secondary | ICD-10-CM | POA: Diagnosis not present

## 2023-07-08 DIAGNOSIS — G4733 Obstructive sleep apnea (adult) (pediatric): Secondary | ICD-10-CM | POA: Diagnosis not present

## 2023-07-08 DIAGNOSIS — K7581 Nonalcoholic steatohepatitis (NASH): Secondary | ICD-10-CM | POA: Diagnosis not present

## 2023-07-08 DIAGNOSIS — I110 Hypertensive heart disease with heart failure: Secondary | ICD-10-CM | POA: Diagnosis not present

## 2023-07-09 NOTE — Telephone Encounter (Signed)
A letter has been sent to [facility] requesting the diabetic exam result. Unfortunately, if they don't respond the patient will continue to get the letter until we've received the results.   To expedite matters, please have the patient contact [facility] and fax the results to [phone]. Thank you

## 2023-07-11 ENCOUNTER — Other Ambulatory Visit: Payer: Self-pay | Admitting: Physician Assistant

## 2023-07-12 ENCOUNTER — Ambulatory Visit: Payer: Self-pay | Admitting: Licensed Clinical Social Worker

## 2023-07-12 ENCOUNTER — Telehealth: Payer: Self-pay | Admitting: Family Medicine

## 2023-07-12 NOTE — Patient Outreach (Signed)
  Care Coordination   07/12/2023 Name: Tanya Hogan MRN: [id number] DOB: April 04, 1951   Care Coordination Outreach Attempts:  An unsuccessful telephone outreach was attempted today to offer the patient information about available care coordination services.  Follow Up Plan:  Additional outreach attempts will be made to offer the patient care coordination information and services.   Encounter Outcome:  No Answer   Care Coordination Interventions:  No, not indicated    Kelton Pillar.Dearl Rudden MSW, LCSW Licensed Visual merchandiser [facility] Care Management [phone]

## 2023-07-12 NOTE — Patient Instructions (Signed)
Visit Information  Thank you for taking time to visit with me today. Please don't hesitate to contact me if I can be of assistance to you.   Following are the goals we discussed today:   Goals Addressed             This Visit's Progress    Patient has stress in managing medical needs       Interventions:  Spoke with client via phone today about client status and needs Client is currently at [facility] where she has been receiving medical care. She said she is scheduled to discharge from the hospital today to return to her home Discussed client support in the home. She has support from her brother and from her daughter Discussed pain issues of client Discussed medication procurement of client Discussed program support with RN, LCSW, Pharmacist RN [name] is scheduled to call client on 08/05/23. Client was informed of this information. She said she thinks she would like to talk with a nurse. Spoke with Haylynn about her walking challenges. She uses a [place] to help her walk Discussed support with PCP Discussed insurance of client. Discussed Dual complete coverage. She has U Card to use for food assistance.   Discussed transportation of client. She has vehicle she drives. Discussed food procurement for client.  She has difficulty in getting groceries.   Provided counseling support for client Discussed edema issues of client Used Active Listening techniques to allow client to discuss her feelings and concerns Client said she fatigues easily. Client said it is difficult to manage home cleaning due to decreased energy level Discussed program support with RN, LCSW, Pharmacist Discussed exercise of client. She does walking exercises Discussed sleeping issues of client Discussed equipment needs of client Discussed care needs of her brother. She resides with her brother. Her brother has Parkinson's diagnoses and she helps with care needs of her brother.  Client said  her daughter was coming to hospital today to pick up client to take client home from hospital. Encouraged client to call LCSW for SW support for client at [phone].          Our next appointment is by telephone on 07/26/23 at 9:30 AM   Please call the care guide team at [phone] if you need to cancel or reschedule your appointment.   If you are experiencing a Mental Health or Behavioral Health Crisis or need someone to talk to, please go to [facility] [address] [place] [phone])   The patient verbalized understanding of instructions, educational materials, and care plan provided today and DECLINED offer to receive copy of patient instructions, educational materials, and care plan.   The patient has been provided with contact information for the care management team and has been advised to call with any health related questions or concerns.   Kelton Pillar.Starlene Consuegra MSW, LCSW Licensed Visual merchandiser [facility] Care Management [phone]

## 2023-07-12 NOTE — Telephone Encounter (Signed)
Patient called in stating her PA for [facility] 3 was declined because we withdrew the application.

## 2023-07-12 NOTE — Patient Outreach (Signed)
Care Coordination   Follow Up Visit Note   07/12/2023 Name: Tanya Hogan MRN: [id number] DOB: 01/22/1951  Tanya Hogan is a 72 y.o. year old female who sees Tanya Hogan for primary care. I spoke with  Tanya Hogan by phone today.  What matters to the patients health and wellness today?  Patient has stress in managing medical needs    Goals Addressed             This Visit's Progress    Patient has stress in managing medical needs       Interventions:  Spoke with client via phone today about client status and needs Client is currently at [facility] where she has been receiving medical care. She said she is scheduled to discharge from the hospital today to return to her home Discussed client support in the home. She has support from her brother and from her daughter Discussed pain issues of client Discussed medication procurement of client Discussed program support with RN, LCSW, Pharmacist Tanya Hogan is scheduled to call client on 08/05/23. Client was informed of this information. She said she thinks she would like to talk with a nurse. Spoke with Tanya Hogan about her walking challenges. She uses a [place] to help her walk Discussed support with PCP Discussed insurance of client. Discussed Dual complete coverage. She has U Card to use for food assistance.   Discussed transportation of client. She has vehicle she drives. Discussed food procurement for client.  She has difficulty in getting groceries.   Provided counseling support for client Discussed edema issues of client Used Active Listening techniques to allow client to discuss her feelings and concerns Client said she fatigues easily. Client said it is difficult to manage home cleaning due to decreased energy level Discussed program support with RN, LCSW, Pharmacist Discussed exercise of client. She does walking exercises Discussed sleeping issues of client Discussed equipment  needs of client Discussed care needs of her brother. She resides with her brother. Her brother has Parkinson's diagnoses and she helps with care needs of her brother.  Client said her daughter was coming to hospital today to pick up client to take client home from hospital. Encouraged client to call LCSW for SW support for client at [phone].          SDOH assessments and interventions completed:  Yes  SDOH Interventions Today    Flowsheet Row Most Recent Value  SDOH Interventions   Depression Interventions/Treatment  Counseling  Physical Activity Interventions Other (Comments)  [using a [place] to help her walk]  Stress Interventions Provide Counseling  [stress in managing medical needs]        Care Coordination Interventions:  Yes, provided  Interventions Today    Flowsheet Row Most Recent Value  Chronic Disease   Chronic disease during today's visit Other  [spoke with client about client needs]  General Interventions   General Interventions Discussed/Reviewed General Interventions Discussed, Community Resources  Exercise Interventions   Exercise Discussed/Reviewed Physical Activity  [uses a [place] to help her walk]  Education Interventions   Education Provided Provided Education  Provided Verbal Education On [employer]  Mental Health Interventions   Mental Health Discussed/Reviewed Coping Strategies  [trying to cope with medical needs. has support of her brother and of her daughter]  Nutrition Interventions   Nutrition Discussed/Reviewed Nutrition Discussed  Pharmacy Interventions   Pharmacy Dicussed/Reviewed Pharmacy Topics Discussed  Safety Interventions   Safety Discussed/Reviewed Fall Risk  Follow up plan: Follow up call scheduled for 07/26/23 at 9:30 AM    Encounter Outcome:  Patient Visit Completed   Tanya Hogan MSW, LCSW Licensed Visual merchandiser [facility] Care Management [phone]

## 2023-07-13 ENCOUNTER — Telehealth: Payer: Self-pay | Admitting: Family Medicine

## 2023-07-13 NOTE — Telephone Encounter (Signed)
I also called pt and informed her to let her eye doctor know to send records here so we can update her chart with her vision exam.

## 2023-07-13 NOTE — Telephone Encounter (Signed)
Patient called she received a denial letter in the mail for [facility] Sensor please contact patient

## 2023-07-14 ENCOUNTER — Encounter: Payer: Self-pay | Admitting: Family Medicine

## 2023-07-14 ENCOUNTER — Ambulatory Visit (INDEPENDENT_AMBULATORY_CARE_PROVIDER_SITE_OTHER): Payer: 59 | Admitting: Family Medicine

## 2023-07-14 VITALS — BP 81/59 | HR 72 | Ht 61.0 in | Wt 214.0 lb

## 2023-07-14 DIAGNOSIS — J40 Bronchitis, not specified as acute or chronic: Secondary | ICD-10-CM | POA: Diagnosis not present

## 2023-07-14 DIAGNOSIS — E876 Hypokalemia: Secondary | ICD-10-CM

## 2023-07-14 DIAGNOSIS — J329 Chronic sinusitis, unspecified: Secondary | ICD-10-CM | POA: Diagnosis not present

## 2023-07-14 DIAGNOSIS — I4729 Other ventricular tachycardia: Secondary | ICD-10-CM | POA: Diagnosis not present

## 2023-07-14 DIAGNOSIS — E038 Other specified hypothyroidism: Secondary | ICD-10-CM

## 2023-07-14 MED ORDER — AZITHROMYCIN 250 MG PO TABS: ORAL_TABLET | ORAL | 0 refills | Status: AC

## 2023-07-14 NOTE — Assessment & Plan Note (Signed)
F/U with Cards scheduled in 3 weeks. Make sure to pick up new amiodarone Rx.  She hasn't picked it up yet, but says she is taking total of BID.

## 2023-07-14 NOTE — Progress Notes (Signed)
Established Patient Office Visit  Subjective   Patient ID: Tanya Hogan, female    DOB: [birth year]  Age: 72 y.o. MRN: [id number]  Chief Complaint  Patient presents with   Hospitalization Follow-up    HPI  [facility] and discharged home 4 days later on November 18 principal diagnosis with nonsustained ventricular tachycardia.  She initially presented to the ER with chest heaviness and palpitations she was found to be in V. tach and had pacemaker interrogation performed.  It did reveal some previous episodes of nonsustained V. tach.  Labs revealed hypokalemia and hypomagnesia.  These were repleted and she was started on high-dose amiodarone for follow-up with cardiology.  Bumetanide was discontinued as patient was hypotensive and no sign of fluid overload on exam.   So had a cough sore throat and runny nose for almost 2 weeks as well she also had diarrhea for most 4 weeks before she went into the hospital.  She still having some diarrhea and loose stools so she stopped her Linzess today.  Has not picked up her potassium from the pharmacy yet.  Does not have a cardiology follow-up yet.     ROS    Objective:     BP (!) 81/59   Pulse 72   Ht 5\' 1"  (1.549 m)   Wt 214 lb (97.1 kg)   SpO2 97%   BMI 40.43 kg/m    Physical Exam Vitals and nursing note reviewed.  Constitutional:      Appearance: Normal appearance.  HENT:     Head: Normocephalic and atraumatic.  Eyes:     Conjunctiva/sclera: Conjunctivae normal.  Cardiovascular:     Rate and Rhythm: Normal rate and regular rhythm.  Pulmonary:     Effort: Pulmonary effort is normal.     Breath sounds: Normal breath sounds.  Skin:    General: Skin is warm and dry.  Neurological:     Mental Status: She is alert.  Psychiatric:        Mood and Affect: Mood normal.      Results for orders placed or performed in visit on 07/14/23  HM DIABETES EYE EXAM  Result Value Ref Range   HM  Diabetic Eye Exam        The ASCVD Risk score (Arnett DK, et al., 2019) failed to calculate for the following reasons:   The patient has a prior MI or stroke diagnosis    Assessment & Plan:   Problem List Items Addressed This Visit       Cardiovascular and Mediastinum   NSVT (nonsustained ventricular tachycardia) (HCC)    F/U with Cards scheduled in 3 weeks. Make sure to pick up new amiodarone Rx.  She hasn't picked it up yet, but says she is taking total of BID.        Relevant Medications   amiodarone (PACERONE) 400 MG tablet     Endocrine   Hypothyroidism - Primary   Relevant Medications   levothyroxine (SYNTHROID) 88 MCG tablet   Other Relevant Orders   Basic Metabolic Panel (BMET)   Magnesium   TSH   Other Visit Diagnoses     Hypokalemia       Relevant Orders   Basic Metabolic Panel (BMET)   Magnesium   TSH   Hypomagnesemia       Relevant Orders   Basic Metabolic Panel (BMET)   Magnesium   TSH   Sinobronchitis  Relevant Medications   azithromycin (ZITHROMAX) 250 MG tablet       Sinobronchitis-she has had symptoms for 2 weeks at this point and is still coughing hard and has a persistent sore throat.  She had a negative viral panel done during the hospitalization but has not improved somewhat going to treat her with azithromycin today.  Okay to use Cepacol as well.  Hypokalemia-plan to recheck today she was unable to pick up the potassium tabs.  I suspect that the 4 weeks of diarrhea before admission were was the most likely culprit but they have also held her Bumex.  Low magnesium-will recheck again today.  No follow-ups on file.   I spent 42 minutes on the day of the encounter to include pre-visit record review, face-to-face time with the patient and post visit ordering of test.   Nani Gasser, MD

## 2023-07-14 NOTE — Telephone Encounter (Signed)
Never saw this

## 2023-07-15 LAB — BASIC METABOLIC PANEL
BUN/Creatinine Ratio: 17 (ref 12–28)
BUN: 13 mg/dL (ref 8–27)
CO2: 21 mmol/L (ref 20–29)
Calcium: 10.2 mg/dL (ref 8.7–10.3)
Chloride: 103 mmol/L (ref 96–106)
Creatinine, Ser: 0.75 mg/dL (ref 0.57–1.00)
Glucose: 105 mg/dL — ABNORMAL HIGH (ref 70–99)
Potassium: 4.6 mmol/L (ref 3.5–5.2)
Sodium: 138 mmol/L (ref 134–144)
eGFR: 83 mL/min/{1.73_m2} (ref >59)

## 2023-07-15 LAB — MAGNESIUM: Magnesium: 2.2 mg/dL (ref 1.6–2.3)

## 2023-07-15 LAB — TSH: TSH: 5.22 u[IU]/mL — ABNORMAL HIGH (ref 0.450–4.500)

## 2023-07-15 NOTE — Progress Notes (Signed)
Hi Tanya Hogan, you need [place] adjustment to your thyroid medication, based on your lab results.  Would you like [place] to send this information to your endocrinologist?

## 2023-07-15 NOTE — Progress Notes (Signed)
Sorry for the confusion on the note below.  It looks like the thyroid medicine was listed as a historical med.  Can you please just verify exactly how you are taking your thyroid medication and we can make a slight adjustment.  Also is your blood pressure been running a little bit better at home?

## 2023-07-16 ENCOUNTER — Telehealth: Payer: Self-pay

## 2023-07-16 NOTE — Telephone Encounter (Signed)
Per CRM - Amiodarone requires a prior authorization.

## 2023-07-16 NOTE — Telephone Encounter (Signed)
Copied from CRM [id number]. Topic: Clinical - Medication Refill >> Jul 16, 2023 10:43 AM Dollene Primrose wrote: Most Recent Primary Care Visit:  Provider: Nani Gasser D  Department: [facility]  Visit Type: HOSPITAL FU  Date: 07/14/2023  Medication:  amiodarone (PACERONE) 400 MG tablet   Has the patient contacted their pharmacy? Yes-PRIOR AUTHORIZATION NEEDED (Agent: If no, request that the patient contact the pharmacy for the refill. If patient does not wish to contact the pharmacy document the reason why and proceed with request.) (Agent: If yes, when and what did the pharmacy advise?)  Is this the correct pharmacy for this prescription? yes If no, delete pharmacy and type the correct one.  This is the patient's preferred pharmacy:  [id number] - [place], [place] - [address] AT [facility] [address] [place] [address] Phone: [phone] Fax: [phone]     Has the prescription been filled recently? yes  Is the patient out of the medication? yes  Has the patient been seen for an appointment in the last year OR does the patient have an upcoming appointment? yes  Can we respond through MyChart? yes  Agent: Please be advised that Rx refills may take up to 3 business days. We ask that you follow-up with your pharmacy.

## 2023-07-17 NOTE — Telephone Encounter (Signed)
We have received records and care team has been updated.

## 2023-07-18 ENCOUNTER — Other Ambulatory Visit: Payer: Self-pay | Admitting: Family Medicine

## 2023-07-18 ENCOUNTER — Encounter: Payer: Self-pay | Admitting: Family Medicine

## 2023-07-18 DIAGNOSIS — E038 Other specified hypothyroidism: Secondary | ICD-10-CM

## 2023-07-19 ENCOUNTER — Telehealth: Payer: Self-pay

## 2023-07-19 NOTE — Telephone Encounter (Addendum)
Initiated Prior authorization WUJ:[id number] HCl 400MG  tablets Via: Covermymeds Case/Key:[id number] Status: approved  as of 07/19/23 Reason: Authorization Expiration Date: 08/23/2024 Notified Pt via: Mychart  AMIODARONE HCL 200MG  OR PACERONE is preferred brand and strength         Initiated Prior authorization FAO:[id number] Libre 2 Reader device Via: Covermymeds Case/Key:[id number] Status: previously denied as of 12/22/22 Reason:Continuous glucose monitor system (receiver, transmitter, and sensor) is denied for not meeting the prior authorization requirement(s). Product authorization requires the following: (1) Submission of medical records (for example: chart notes, laboratory values) or claims history documenting one of the following: (A) You are being treated with insulin. (B) You have a history of problematic hypoglycemia with documentation of recurrent level 2 hypoglycemic events [glucose less than 54mg /dl (3.0 mmol/L)] that persist despite multiple attempts to adjust medication(s) and/or modify the diabetes treatment plan. (C) You have a history of problematic hypoglycemia with documentation of a history of a level 3 hypoglycemic event [glucose less than 54mg /dl (3.0 mmol/L)] characterized by altered mental and/or physical state requiring third-party assistance for treatment of hypoglycemia. Reviewed by: Ellene Route, R.Ph. **Please note: This review applies to [employer], Dexcom G7, Freestyle [place], [address], [address], and [place] [address]. Notified Pt via: Mychart

## 2023-07-19 NOTE — Telephone Encounter (Signed)
Awesome, thank you.  I think she keeps getting some of her medications confused as far as who writes them.

## 2023-07-19 NOTE — Telephone Encounter (Signed)
Hi Key,   Can you please back out that authorization I do not write her amiodarone and I discussed that this has to come from her cardiologist not me.

## 2023-07-20 ENCOUNTER — Ambulatory Visit: Payer: 59 | Admitting: Family Medicine

## 2023-07-20 MED ORDER — LEVOTHYROXINE SODIUM 112 MCG PO TABS: 112.0000 ug | ORAL_TABLET | Freq: Every day | ORAL | 0 refills | Status: DC

## 2023-07-20 NOTE — Telephone Encounter (Signed)
Ordered labs

## 2023-07-20 NOTE — Telephone Encounter (Signed)
New prescription sent for 112 mcg daily.  Start new medication and plan to recheck TSH in 8 weeks.   Meds ordered this encounter  Medications   levothyroxine (SYNTHROID) 112 MCG tablet    Sig: Take 1 tablet (112 mcg total) by mouth daily.    Dispense:  90 tablet    Refill:  0

## 2023-07-20 NOTE — Progress Notes (Signed)
   07/20/2023  Patient ID: Tanya Hogan, female   DOB: [birth year], 72 y.o.   MRN: [id number]  Received a voicemail from patient stating she has not been able to get her [name] 3 sensors refilled through DME supplier.  Per [employer], it appears her current supplier does not have Libre 3 sensors in stock.  I was able to find a supplier that does, and have submitted an order to this company.  I will notify patient via MyChart message and monitor progress of order.  Lenna Gilford, PharmD, DPLA

## 2023-07-20 NOTE — Telephone Encounter (Signed)
I called and advised patient of recommendation.

## 2023-07-21 ENCOUNTER — Telehealth: Payer: 59 | Admitting: Family Medicine

## 2023-07-21 ENCOUNTER — Encounter: Payer: Self-pay | Admitting: Family Medicine

## 2023-07-21 DIAGNOSIS — Z87891 Personal history of nicotine dependence: Secondary | ICD-10-CM | POA: Diagnosis not present

## 2023-07-21 DIAGNOSIS — J40 Bronchitis, not specified as acute or chronic: Secondary | ICD-10-CM | POA: Diagnosis not present

## 2023-07-21 MED ORDER — PREDNISONE 20 MG PO TABS: 20.0000 mg | ORAL_TABLET | Freq: Two times a day (BID) | ORAL | 0 refills | Status: AC

## 2023-07-21 MED ORDER — BENZONATATE 200 MG PO CAPS: 200.0000 mg | ORAL_CAPSULE | Freq: Two times a day (BID) | ORAL | 0 refills | Status: DC | PRN

## 2023-07-21 NOTE — Progress Notes (Signed)
Started before September.   Sx: cough  Meds:  Benadryl, throat lozenges and cough drops

## 2023-07-21 NOTE — Assessment & Plan Note (Signed)
Recently completed azithromycin.  Adding course of prednisone and tessalon perles. Red flags in precautions discussed.  Worsening symptoms would warrant in person evaluation.

## 2023-07-21 NOTE — Progress Notes (Signed)
Tanya Hogan - 72 y.o. female MRN [id number]  Date of birth: 09/07/1950   This visit type was conducted due to national recommendations for restrictions regarding the COVID-19 Pandemic (e.g. social distancing).  This format is felt to be most appropriate for this patient at this time.  All issues noted in this document were discussed and addressed.  No physical exam was performed (except for noted visual exam findings with Video Visits).  I discussed the limitations of evaluation and management by telemedicine and the availability of in person appointments. The patient expressed understanding and agreed to proceed.  I connected withNAME@ on 07/25/23 at 10:50 AM EST by a video enabled telemedicine application and verified that I am speaking with the correct person using two identifiers.  Present at visit: Tanya Coombe, DO Tanya Hogan   Patient Location: Home [address] [place] [address]   Provider location:   [facility]  Chief Complaint  Patient presents with   Cough   URI    HPI  Tanya Hogan is a 72 y.o. female who presents via audio/video conferencing for a telehealth visit today.  She is following up with continued cough and congestion.  Recent hospitalization for V tach.  Developed cough with congestion during hsopitalization.  CXR without infiltrates. Respiratory virus panel negative.   Seen by Tanya Hogan last week and azithromycin added. Didn't really note much improvement with this.  She does have a history of asthma.  She does feel shortness of breath with coughing.  Hasn't really noted much wheezing.  She does get some relief with benadryl.    ROS:  A comprehensive ROS was completed and negative except as noted per HPI  Past Medical History:  Diagnosis Date   Alkaline phosphatase elevation    Asthma    Breast cancer (HCC) 2011   Carpal tunnel syndrome    bilateral   Chronic HFrEF (heart failure with reduced ejection fraction) (HCC)    Cirrhosis,  non-alcoholic (HCC)    Closed fracture of unspecified part of radius (alone)    DDD (degenerative disc disease), lumbar    Depression    pt denies   Diabetes mellitus without complication (HCC)    Enlarged heart    Fibromyalgia    GERD (gastroesophageal reflux disease)    History of kidney stones    HTN (hypertension)    Hypothyroidism    IBS (irritable bowel syndrome)    LBBB (left bundle branch block)    Microcytic anemia 05/14/2017   Mitral regurgitation    NICM (nonischemic cardiomyopathy) (HCC)    Pneumonia    Presence of permanent cardiac pacemaker    Pulmonary hypertension (HCC)    PVD (peripheral vascular disease) (HCC)    2019   Sleep apnea    uses cpap   Tricuspid regurgitation    Ventricular tachyarrhythmia [facility])     Past Surgical History:  Procedure Laterality Date   BIV PACEMAKER GENERATOR CHANGEOUT N/A 11/03/2021   Procedure: BIV PACEMAKER GENERATOR CHANGEOUT;  Surgeon: Marinus Maw, MD;  Location: [facility] INVASIVE CV LAB;  Service: Cardiovascular;  Laterality: N/A;   BREAST LUMPECTOMY Left 2011   COLONOSCOPY     FOOT SURGERY     KNEE SURGERY     LEAD REVISION/REPAIR N/A 11/03/2021   Procedure: LEAD REVISION/REPAIR;  Surgeon: Marinus Maw, MD;  Location: [facility] INVASIVE CV LAB;  Service: Cardiovascular;  Laterality: N/A;   MASTECTOMY Right    05/2019   PACEMAKER GENERATOR CHANGE N/A  08/23/2014   Procedure: PACEMAKER GENERATOR CHANGE;  Surgeon: Marinus Maw, MD;  Location: [facility] CATH LAB;  Service: Cardiovascular;  Laterality: N/A;   PACEMAKER PLACEMENT  2009    cardiology   RECTAL SURGERY  1976   REDUCTION MAMMAPLASTY Left    05/2019   TOTAL SHOULDER ARTHROPLASTY Right 03/10/2018   TOTAL SHOULDER ARTHROPLASTY Right 03/10/2018   Procedure: RIGHT TOTAL SHOULDER ARTHROPLASTY;  Surgeon: Jones Broom, MD;  Location: [facility] OR;  Service: Orthopedics;  Laterality: Right;   TUBAL LIGATION      Family History  Problem Relation Age of Onset   Heart disease  Mother    Melanoma Mother    Parkinson's disease Mother    Heart disease Father    Heart disease Brother    Heart failure Other    Breast cancer Other    Hypertension Other     Social History   Socioeconomic History   Marital status: Divorced    Spouse name: Not on file   Number of children: 2   Years of education: 14   Highest education level: Associate degree: academic program  Occupational History   Occupation: quality insurcance in Sports coach    Comment: retired  Tobacco Use   Smoking status: Former    Current packs/day: 0.00    Types: Cigarettes    Quit date: 08/24/1966    Years since quitting: 56.9   Smokeless tobacco: Never  Vaping Use   Vaping status: Never Used  Substance and Sexual Activity   Alcohol use: No   Drug use: No   Sexual activity: Not Currently  Other Topics Concern   Not on file  Social History Narrative   Retired. Lives with and helps her brother. Likes Risk manager, Nutritional therapist and music. Loves animals.   Social Determinants of Health   Financial Resource Strain: Low Risk  (04/09/2023)   Received from [facility]   Overall Financial Resource Strain (CARDIA)    Difficulty of Paying Living Expenses: Not hard at all  Food Insecurity: Low Risk  (07/08/2023)   Received from [facility]   Hunger Vital Sign    Worried About Running Out of Food in the Last Year: Never true    Ran Out of Food in the Last Year: Never true  Transportation Needs: No Transportation Needs (07/08/2023)   Received from [employer]    In the past 12 months, has lack of reliable transportation kept you from medical appointments, meetings, work or from getting things needed for daily living? : No  Physical Activity: Insufficiently Active (07/12/2023)   Exercise Vital Sign    Days of Exercise per Week: 1 day    Minutes of Exercise per Session: 10 min  Stress: Stress Concern Present (07/12/2023)   [employer] of Occupational Health -  Occupational Stress Questionnaire    Feeling of Stress : To some extent  Social Connections: Moderately Integrated (08/06/2022)   Social Connection and Isolation Panel [NHANES]    Frequency of Communication with Friends and Family: More than three times a week    Frequency of Social Gatherings with Friends and Family: Three times a week    Attends Religious Services: More than 4 times per year    Active Member of Clubs or Organizations: Yes    Attends Banker Meetings: More than 4 times per year    Marital Status: Divorced  Intimate Partner Violence: Not At Risk (10/29/2022)   Humiliation, Afraid, Rape, and Kick questionnaire  Fear of Current or Ex-Partner: No    Emotionally Abused: No    Physically Abused: No    Sexually Abused: No     Current Outpatient Medications:    benzonatate (TESSALON) 200 MG capsule, Take 1 capsule (200 mg total) by mouth 2 (two) times daily as needed for cough., Disp: 20 capsule, Rfl: 0   predniSONE (DELTASONE) 20 MG tablet, Take 1 tablet (20 mg total) by mouth 2 (two) times daily with a meal for 5 days., Disp: 10 tablet, Rfl: 0   albuterol (PROVENTIL) (2.5 MG/3ML) 0.083% nebulizer solution, Take 2.5 mg by nebulization every 6 (six) hours as needed for wheezing or shortness of breath., Disp: , Rfl:    albuterol (VENTOLIN HFA) 108 (90 Base) MCG/ACT inhaler, Inhale 1-2 puffs into the lungs every 6 (six) hours as needed for wheezing or shortness of breath., Disp: 54 g, Rfl: 3   alendronate (FOSAMAX) 70 MG tablet, TAKE 1 TABLET BY MOUTH  EVERY 7 DAYS WITH A FULL  GLASS OF WATER ON AN EMPTY  STOMACH, Disp: 12 tablet, Rfl: 3   AMBULATORY NON FORMULARY MEDICATION, Medication Name: Tubing and supplies for nebulizer. Fax - [phone], Disp: 1 vial, Rfl: 0   amiodarone (PACERONE) 400 MG tablet, Take 400 mg by mouth 2 (two) times daily., Disp: , Rfl:    aspirin 81 MG chewable tablet, Chew 81 mg by mouth daily., Disp: , Rfl:    Benzoyl Peroxide (PANOXYL  FOAMING WASH) 10 % LIQD, Wash groin creases daily with this wash  Ok to sub something similar, Disp: 142 g, Rfl: 1   bisacodyl 5 MG EC tablet, Take 5 mg by mouth 2 (two) times daily., Disp: , Rfl:    bumetanide (BUMEX) 2 MG tablet, Take 1 tablet (2 mg total) by mouth 2 (two) times daily., Disp: 180 tablet, Rfl: 3   clotrimazole (LOTRIMIN) 1 % cream, Apply 1 Application topically 2 (two) times daily. X 2-3 weeks, Disp: 30 g, Rfl: 1   Continuous Glucose Receiver (FREESTYLE LIBRE 3 READER) DEVI, Use for continuous glucose monitoring, Disp: 1 each, Rfl: 0   Continuous Glucose Sensor (FREESTYLE LIBRE 3 SENSOR) MISC, by Does not apply route. Change every 14 days, Disp: , Rfl:    Docusate Sodium (COLACE PO), Take by mouth. 1 per day, Disp: , Rfl:    EPIPEN 2-PAK 0.3 MG/0.3ML SOAJ injection, Inject 0.3 mg into the muscle as needed for anaphylaxis., Disp: , Rfl:    fexofenadine (ALLEGRA) 180 MG tablet, Take 180 mg by mouth daily., Disp: , Rfl:    fluticasone-salmeterol (ADVAIR HFA) 115-21 MCG/ACT inhaler, Inhale 2 puffs into the lungs 2 (two) times daily., Disp: , Rfl:    glucose blood test strip, To be used to check blood glucose Dx:E11.8 FreeStyle Precision blood glucose for Freestyle Libre 2, Disp: 100 each, Rfl: 12   levothyroxine (SYNTHROID) 112 MCG tablet, Take 1 tablet (112 mcg total) by mouth daily., Disp: 90 tablet, Rfl: 0   LINZESS 290 MCG CAPS capsule, Take 290 mcg by mouth daily at 2 am., Disp: , Rfl:    metFORMIN (GLUCOPHAGE-XR) 500 MG 24 hr tablet, Take 1 tablet (500 mg total) by mouth 2 (two) times daily with a meal., Disp: 180 tablet, Rfl: 1   Multiple Vitamins-Minerals (OCUVITE EYE + MULTI) TABS, Take 1 tablet by mouth in the morning., Disp: , Rfl:    nystatin cream (MYCOSTATIN), Apply 1 Application topically 2 (two) times daily as needed (rash)., Disp: 30 g, Rfl: 3  Omega-3 Fatty Acids (FISH OIL) 1000 MG CAPS, Take 1,000 mg by mouth daily., Disp: , Rfl:    pantoprazole (PROTONIX) 40 MG  tablet, TAKE 1 TABLET BY MOUTH DAILY  BEFORE BREAKFAST, Disp: 90 tablet, Rfl: 3   potassium chloride SA (KLOR-CON M) 20 MEQ tablet, Take 1 tablet by mouth daily., Disp: , Rfl:    pravastatin (PRAVACHOL) 40 MG tablet, TAKE 1 TABLET BY MOUTH AT  BEDTIME, Disp: 60 tablet, Rfl: 5   Tiotropium Bromide Monohydrate (SPIRIVA RESPIMAT) 1.25 MCG/ACT AERS, Inhale 2 puffs into the lungs daily., Disp: , Rfl:    valACYclovir (VALTREX) 1000 MG tablet, Take 1 tablet (1,000 mg total) by mouth 2 (two) times daily., Disp: 14 tablet, Rfl: 2   VITAMIN D PO, Take 1 tablet by mouth daily., Disp: , Rfl:   EXAM:  VITALS per patient if applicable: There were no vitals taken for this visit.  GENERAL: alert, oriented, appears well and in no acute distress  HEENT: atraumatic, conjunttiva clear, no obvious abnormalities on inspection of external nose and ears  NECK: normal movements of the head and neck  LUNGS: on inspection no signs of respiratory distress, breathing rate appears normal, no obvious gross SOB, gasping or wheezing  CV: no obvious cyanosis  MS: moves all visible extremities without noticeable abnormality  PSYCH/NEURO: pleasant and cooperative, no obvious depression or anxiety, speech and thought processing grossly intact  ASSESSMENT AND PLAN:  Discussed the following assessment and plan:  Bronchitis Recently completed azithromycin.  Adding course of prednisone and tessalon perles. Red flags in precautions discussed.  Worsening symptoms would warrant in person evaluation.      I discussed the assessment and treatment plan with the patient. The patient was provided an opportunity to ask questions and all were answered. The patient agreed with the plan and demonstrated an understanding of the instructions.   The patient was advised to call back or seek an in-person evaluation if the symptoms worsen or if the condition fails to improve as anticipated.    Tanya Coombe, DO

## 2023-07-25 DIAGNOSIS — Z880 Allergy status to penicillin: Secondary | ICD-10-CM | POA: Diagnosis not present

## 2023-07-25 DIAGNOSIS — K760 Fatty (change of) liver, not elsewhere classified: Secondary | ICD-10-CM | POA: Diagnosis not present

## 2023-07-25 DIAGNOSIS — Z7984 Long term (current) use of oral hypoglycemic drugs: Secondary | ICD-10-CM | POA: Diagnosis not present

## 2023-07-25 DIAGNOSIS — R7989 Other specified abnormal findings of blood chemistry: Secondary | ICD-10-CM | POA: Diagnosis not present

## 2023-07-25 DIAGNOSIS — E875 Hyperkalemia: Secondary | ICD-10-CM | POA: Diagnosis not present

## 2023-07-25 DIAGNOSIS — I5082 Biventricular heart failure: Secondary | ICD-10-CM | POA: Diagnosis not present

## 2023-07-25 DIAGNOSIS — I110 Hypertensive heart disease with heart failure: Secondary | ICD-10-CM | POA: Diagnosis not present

## 2023-07-25 DIAGNOSIS — Z79899 Other long term (current) drug therapy: Secondary | ICD-10-CM | POA: Diagnosis not present

## 2023-07-25 DIAGNOSIS — R519 Headache, unspecified: Secondary | ICD-10-CM | POA: Diagnosis not present

## 2023-07-25 DIAGNOSIS — E119 Type 2 diabetes mellitus without complications: Secondary | ICD-10-CM | POA: Diagnosis not present

## 2023-07-25 DIAGNOSIS — Z7982 Long term (current) use of aspirin: Secondary | ICD-10-CM | POA: Diagnosis not present

## 2023-07-25 DIAGNOSIS — R202 Paresthesia of skin: Secondary | ICD-10-CM | POA: Diagnosis not present

## 2023-07-25 DIAGNOSIS — I5022 Chronic systolic (congestive) heart failure: Secondary | ICD-10-CM | POA: Diagnosis not present

## 2023-07-25 DIAGNOSIS — J4540 Moderate persistent asthma, uncomplicated: Secondary | ICD-10-CM | POA: Diagnosis not present

## 2023-07-25 DIAGNOSIS — Z8673 Personal history of transient ischemic attack (TIA), and cerebral infarction without residual deficits: Secondary | ICD-10-CM | POA: Diagnosis not present

## 2023-07-25 DIAGNOSIS — R000 Tachycardia, unspecified: Secondary | ICD-10-CM | POA: Diagnosis not present

## 2023-07-25 DIAGNOSIS — D72829 Elevated white blood cell count, unspecified: Secondary | ICD-10-CM | POA: Diagnosis not present

## 2023-07-25 DIAGNOSIS — Z950 Presence of cardiac pacemaker: Secondary | ICD-10-CM | POA: Diagnosis not present

## 2023-07-25 DIAGNOSIS — Z888 Allergy status to other drugs, medicaments and biological substances status: Secondary | ICD-10-CM | POA: Diagnosis not present

## 2023-07-25 DIAGNOSIS — G4733 Obstructive sleep apnea (adult) (pediatric): Secondary | ICD-10-CM | POA: Diagnosis not present

## 2023-07-25 DIAGNOSIS — I3139 Other pericardial effusion (noninflammatory): Secondary | ICD-10-CM | POA: Diagnosis not present

## 2023-07-25 DIAGNOSIS — R079 Chest pain, unspecified: Secondary | ICD-10-CM | POA: Diagnosis not present

## 2023-07-25 DIAGNOSIS — Z87891 Personal history of nicotine dependence: Secondary | ICD-10-CM | POA: Diagnosis not present

## 2023-07-25 DIAGNOSIS — G8929 Other chronic pain: Secondary | ICD-10-CM | POA: Diagnosis not present

## 2023-07-25 DIAGNOSIS — M542 Cervicalgia: Secondary | ICD-10-CM | POA: Diagnosis not present

## 2023-07-25 DIAGNOSIS — I6782 Cerebral ischemia: Secondary | ICD-10-CM | POA: Diagnosis not present

## 2023-07-26 DIAGNOSIS — E875 Hyperkalemia: Secondary | ICD-10-CM | POA: Diagnosis not present

## 2023-07-26 DIAGNOSIS — R079 Chest pain, unspecified: Secondary | ICD-10-CM | POA: Diagnosis not present

## 2023-07-26 DIAGNOSIS — R000 Tachycardia, unspecified: Secondary | ICD-10-CM | POA: Diagnosis not present

## 2023-07-28 DIAGNOSIS — I5022 Chronic systolic (congestive) heart failure: Secondary | ICD-10-CM | POA: Diagnosis not present

## 2023-07-28 DIAGNOSIS — E118 Type 2 diabetes mellitus with unspecified complications: Secondary | ICD-10-CM | POA: Diagnosis not present

## 2023-07-28 DIAGNOSIS — J4540 Moderate persistent asthma, uncomplicated: Secondary | ICD-10-CM | POA: Diagnosis not present

## 2023-07-28 DIAGNOSIS — E875 Hyperkalemia: Secondary | ICD-10-CM | POA: Diagnosis not present

## 2023-07-30 DIAGNOSIS — G4733 Obstructive sleep apnea (adult) (pediatric): Secondary | ICD-10-CM | POA: Diagnosis not present

## 2023-07-30 DIAGNOSIS — G4737 Central sleep apnea in conditions classified elsewhere: Secondary | ICD-10-CM | POA: Diagnosis not present

## 2023-07-30 DIAGNOSIS — E118 Type 2 diabetes mellitus with unspecified complications: Secondary | ICD-10-CM | POA: Diagnosis not present

## 2023-08-01 ENCOUNTER — Other Ambulatory Visit: Payer: Self-pay | Admitting: Family Medicine

## 2023-08-01 DIAGNOSIS — E1169 Type 2 diabetes mellitus with other specified complication: Secondary | ICD-10-CM

## 2023-08-02 DIAGNOSIS — E875 Hyperkalemia: Secondary | ICD-10-CM | POA: Diagnosis not present

## 2023-08-02 DIAGNOSIS — I5022 Chronic systolic (congestive) heart failure: Secondary | ICD-10-CM | POA: Diagnosis not present

## 2023-08-03 ENCOUNTER — Ambulatory Visit (INDEPENDENT_AMBULATORY_CARE_PROVIDER_SITE_OTHER): Payer: 59

## 2023-08-03 DIAGNOSIS — I442 Atrioventricular block, complete: Secondary | ICD-10-CM | POA: Diagnosis not present

## 2023-08-04 ENCOUNTER — Other Ambulatory Visit: Payer: Self-pay | Admitting: Family Medicine

## 2023-08-04 ENCOUNTER — Encounter: Payer: Self-pay | Admitting: Family Medicine

## 2023-08-04 ENCOUNTER — Ambulatory Visit (INDEPENDENT_AMBULATORY_CARE_PROVIDER_SITE_OTHER): Payer: 59 | Admitting: Family Medicine

## 2023-08-04 VITALS — BP 123/58 | HR 71 | Ht 61.0 in | Wt 213.0 lb

## 2023-08-04 DIAGNOSIS — E876 Hypokalemia: Secondary | ICD-10-CM | POA: Diagnosis not present

## 2023-08-04 DIAGNOSIS — I10 Essential (primary) hypertension: Secondary | ICD-10-CM | POA: Diagnosis not present

## 2023-08-04 DIAGNOSIS — J439 Emphysema, unspecified: Secondary | ICD-10-CM

## 2023-08-04 DIAGNOSIS — E038 Other specified hypothyroidism: Secondary | ICD-10-CM

## 2023-08-04 DIAGNOSIS — I4729 Other ventricular tachycardia: Secondary | ICD-10-CM | POA: Diagnosis not present

## 2023-08-04 LAB — CUP PACEART REMOTE DEVICE CHECK
Battery Remaining Longevity: 71 mo
Battery Voltage: 3.01 V
Brady Statistic AP VP Percent: 24.96 %
Brady Statistic AP VS Percent: 0.1 %
Brady Statistic AS VP Percent: 73.58 %
Brady Statistic AS VS Percent: 1.35 %
Brady Statistic RA Percent Paced: 25.46 %
Brady Statistic RV Percent Paced: 98.54 %
Date Time Interrogation Session: 20241209223425
Implantable Lead Connection Status: 753985
Implantable Lead Connection Status: 753985
Implantable Lead Connection Status: 753985
Implantable Lead Implant Date: 20030324
Implantable Lead Implant Date: 20030324
Implantable Lead Implant Date: 20230313
Implantable Lead Location: 753858
Implantable Lead Location: 753859
Implantable Lead Location: 753860
Implantable Lead Model: 3830
Implantable Lead Model: 4068
Implantable Lead Model: 4193
Implantable Pulse Generator Implant Date: 20230313
Lead Channel Impedance Value: 3306 Ohm
Lead Channel Impedance Value: 3306 Ohm
Lead Channel Impedance Value: 3306 Ohm
Lead Channel Impedance Value: 380 Ohm
Lead Channel Impedance Value: 399 Ohm
Lead Channel Impedance Value: 456 Ohm
Lead Channel Impedance Value: 494 Ohm
Lead Channel Impedance Value: 760 Ohm
Lead Channel Impedance Value: 893 Ohm
Lead Channel Pacing Threshold Amplitude: 1 V
Lead Channel Pacing Threshold Amplitude: 2.25 V
Lead Channel Pacing Threshold Pulse Width: 0.4 ms
Lead Channel Pacing Threshold Pulse Width: 0.8 ms
Lead Channel Sensing Intrinsic Amplitude: 1.25 mV
Lead Channel Sensing Intrinsic Amplitude: 1.25 mV
Lead Channel Sensing Intrinsic Amplitude: 12.25 mV
Lead Channel Sensing Intrinsic Amplitude: 12.25 mV
Lead Channel Setting Pacing Amplitude: 2 V
Lead Channel Setting Pacing Amplitude: 2.5 V
Lead Channel Setting Pacing Amplitude: 3 V
Lead Channel Setting Pacing Pulse Width: 0.4 ms
Lead Channel Setting Pacing Pulse Width: 0.8 ms
Lead Channel Setting Sensing Sensitivity: 2.8 mV
Zone Setting Status: 755011
Zone Setting Status: 755011

## 2023-08-04 NOTE — Assessment & Plan Note (Signed)
Continue you Spiriva and Wixela.

## 2023-08-04 NOTE — Progress Notes (Signed)
Established Patient Office Visit  Subjective   Patient ID: Tanya Hogan, female    DOB: [birth year]  Age: 72 y.o. MRN: [id number]  Chief Complaint  Patient presents with   Hospitalization Follow-up    HPI  Here today for hospital follow-up.  She was admitted to [facility] on November 14 and discharged home 4 days later on November 18.  She presented initially with chest heaviness and palpitations and was found to be in V. tach.  Pacemaker interrogation revealed previous episodes and nonsustained V. tach.  She was hypokalemic and hypomagnesia.  And on a high dose of bumetanide.  He had been having diarrhea for over a month at that point.  She was supplemented with potassium and magnesium and her amiodarone was increased to 40 mg twice a day.  Discharged home on potassium.  She had been home for a few days when she started having a severe headache and ended up calling EMS she was admitted on December 1 and discharged home the next day she was diagnosed with hyperkalemia.  While she was there they reduced her amiodarone back down to 200 mg twice a day, started her on furosemide and stopped her Bumex.  They also started her on hydralazine and isosorbide.   She has felt exhausted and she has been napping a lot when since she has been home.  Her TSH was elevated on 11/20 slight adjustment to her levothyroxine.  She is due to recheck that in 6 weeks.    ROS    Objective:     BP (!) 123/58   Pulse 71   Ht 5\' 1"  (1.549 m)   Wt 213 lb (96.6 kg)   SpO2 97%   BMI 40.25 kg/m    Physical Exam Vitals and nursing note reviewed.  Constitutional:      Appearance: Normal appearance.  HENT:     Head: Normocephalic and atraumatic.  Eyes:     Conjunctiva/sclera: Conjunctivae normal.  Cardiovascular:     Rate and Rhythm: Normal rate and regular rhythm.  Pulmonary:     Effort: Pulmonary effort is normal.     Breath sounds: Normal breath sounds.  Skin:    General: Skin is warm and dry.   Neurological:     Mental Status: She is alert.  Psychiatric:        Mood and Affect: Mood normal.      No results found for any visits on 08/04/23.    The ASCVD Risk score (Arnett DK, et al., 2019) failed to calculate for the following reasons:   The patient has a prior MI or stroke diagnosis    Assessment & Plan:   Problem List Items Addressed This Visit       Cardiovascular and Mediastinum   NSVT (nonsustained ventricular tachycardia) (HCC) - Primary   Relevant Medications   isosorbide mononitrate (IMDUR) 30 MG 24 hr tablet   amiodarone (PACERONE) 200 MG tablet   torsemide (DEMADEX) 10 MG tablet   hydrALAZINE (APRESOLINE) 10 MG tablet   Other Relevant Orders   Basic Metabolic Panel (BMET)   Magnesium   Essential hypertension   Relevant Medications   isosorbide mononitrate (IMDUR) 30 MG 24 hr tablet   amiodarone (PACERONE) 200 MG tablet   torsemide (DEMADEX) 10 MG tablet   hydrALAZINE (APRESOLINE) 10 MG tablet   Other Relevant Orders   Basic Metabolic Panel (BMET)   Magnesium     Respiratory   COPD (chronic obstructive pulmonary disease) (HCC)  Continue you Spiriva and Wixela.      Relevant Medications   fluticasone-salmeterol (WIXELA INHUB) 250-50 MCG/ACT AEPB     Endocrine   Hypothyroidism    Due for thyroid recheck 2nd week of January.        Other Visit Diagnoses     Hypomagnesemia       Relevant Orders   Basic Metabolic Panel (BMET)   Magnesium   Hypokalemia       Relevant Orders   Basic Metabolic Panel (BMET)   Magnesium      She is doing better in regards to her heart but just feels completely exhausted.  I want to recheck a BMP since they did switch and adjust her diuretic to make sure that her potassium and sodium levels are well-regulated and to make sure that the low magnesium is still corrected.  Will get updated labs today should.  She does have follow-up with cardiology in about a week and does have enough pills to get her to her  appointment.    She has been hold switch with causing her diarrhea she actually took it again the other day and it caused an episode of diarrhea so she has stopped it again and plans on following back up with GI in that regard.  She has sent them a note but has not heard back she is leaning to call them again today.   I spent 40 minutes on the day of the encounter to include pre-visit record review, face-to-face time with the patient and post visit ordering of test.    Return in about 6 weeks (around 09/15/2023).    Nani Gasser, MD

## 2023-08-04 NOTE — Assessment & Plan Note (Signed)
Due for thyroid recheck 2nd week of January.

## 2023-08-04 NOTE — Patient Instructions (Addendum)
Due for thyroid recheck 2nd week of January.

## 2023-08-05 ENCOUNTER — Ambulatory Visit: Payer: Self-pay

## 2023-08-05 ENCOUNTER — Telehealth: Payer: Self-pay

## 2023-08-05 DIAGNOSIS — Z79899 Other long term (current) drug therapy: Secondary | ICD-10-CM

## 2023-08-05 DIAGNOSIS — J439 Emphysema, unspecified: Secondary | ICD-10-CM

## 2023-08-05 DIAGNOSIS — I5022 Chronic systolic (congestive) heart failure: Secondary | ICD-10-CM

## 2023-08-05 DIAGNOSIS — I4729 Other ventricular tachycardia: Secondary | ICD-10-CM

## 2023-08-05 DIAGNOSIS — I5020 Unspecified systolic (congestive) heart failure: Secondary | ICD-10-CM

## 2023-08-05 LAB — BASIC METABOLIC PANEL
BUN/Creatinine Ratio: 14 (ref 12–28)
BUN: 14 mg/dL (ref 8–27)
CO2: 24 mmol/L (ref 20–29)
Calcium: 10.5 mg/dL — ABNORMAL HIGH (ref 8.7–10.3)
Chloride: 102 mmol/L (ref 96–106)
Creatinine, Ser: 1.01 mg/dL — ABNORMAL HIGH (ref 0.57–1.00)
Glucose: 97 mg/dL (ref 70–99)
Potassium: 4.5 mmol/L (ref 3.5–5.2)
Sodium: 142 mmol/L (ref 134–144)
eGFR: 58 mL/min/{1.73_m2} — ABNORMAL LOW (ref >59)

## 2023-08-05 LAB — MAGNESIUM: Magnesium: 2.1 mg/dL (ref 1.6–2.3)

## 2023-08-05 NOTE — Telephone Encounter (Signed)
Alert received from [employer]:  Scheduled remote reviewed. Normal device function.   1 VT-NS on 07/31/23 at 17:26, indeterminate true duration due to EKG and dot plot terminating, average V rate 136 bpm, routed to Triage for review.  4 false AT/AF classified events due to [facility] on atrial channel, FFOS also noted on presenting EGM.  HF diagnostics currently abnormal.   Pt with recent hospitalization for VT/hypokalemia/hypomagnesemia.   Pt had device interrogation on July 25, 2023 during an additional ER visit.  Recent hospitalizations were at [facility]. Since interrogation on July 25, 2023-Pt has had one episode of NSVT on July 31, 2023.  Pt will need device interrogation for possible reprogramming.  Pt's optivol is also currently abnormal.  Pt has follow up with Dr. Jens Som August 11, 2023.    Outreach made to Pt.  She states she had a recent change in her diuretic and has lost 10 pounds.  She is agreeable to a follow up appointment with Dr. Ladona Ridgel on 08/10/2023 s/p recent hospitalizations.   See Paceart report in Epic for full transmission details.

## 2023-08-05 NOTE — Patient Instructions (Signed)
Visit Information  Thank you for taking time to visit with me today. Please don't hesitate to contact me if I can be of assistance to you.   Following are the goals we discussed today:  Continue to take medications as prescribed. Continue to attend provider visits as scheduled Continue to eat healthy, lean meats, vegetables, fruits, avoid saturated and transfats Contact provider with health questions or concerns as needed Your [facility] Navigator is Crystal [phone] ext [phone] Take all medications with you to your provider appointments next week.  Our next appointment is by telephone on 08/29/22 at 2:30 pm  Please call the care guide team at [phone] if you need to cancel or reschedule your appointment.   If you are experiencing a Mental Health or Behavioral Health Crisis or need someone to talk to, please call the Suicide and Crisis Lifeline: 988 call the [place] National Suicide Prevention Lifeline: (684)460-8367 or TTY: (774)199-9100 TTY 920-217-3051) to talk to a trained counselor  [name], RN, MSN, BSN, CCM Care Management Coordinator [phone]

## 2023-08-05 NOTE — Progress Notes (Signed)
Your lab work is within acceptable range and there are no concerning findings.   ?

## 2023-08-05 NOTE — Patient Outreach (Signed)
  Care Coordination   Initial Visit Note   08/05/2023 Name: Tanya Hogan MRN: [id number] DOB: 07/15/1951  Tanya Hogan is a 72 y.o. year old female who sees [name], Barbarann Ehlers, MD for primary care. I spoke with  [name] by phone today.  What matters to the patients health and wellness today?  Patient admitted 07/08/23-07/12/23 with NSVT then presented to [facility] ED with headache potassium 5.8. Ms. Tarpinian states that she is having trouble taking care of herself and expresses the chores are beginning to pile up.  She states that her pacemaker is "out of wack" and she has an appointment with cardiology next week. Office visit with PCP completed 08/04/23. Patient lives with her brother. She report primarily eating sandwiches and salads. Ms. Hisey states she was called by her insurance company and told to use [facility], but has not heard from her pulmonologist. She expresses that she is confused about what medications she is to take as some of her medications has been changed recently. Patient is receptive to pharmacy referral for medication questions and medication reconciliation.  Goals Addressed             This Visit's Progress    Assist with health management       Interventions Today    Flowsheet Row Most Recent Value  Chronic Disease   Chronic disease during today's visit Other, Chronic Obstructive Pulmonary Disease (COPD), Congestive Heart Failure (CHF), Hypertension (HTN)  [07/08/23-07/12/23 NSVT,  Presented to ED [facility] 07/25/23-07/26/23 with c/o Headache and K 5.8]  General Interventions   General Interventions Discussed/Reviewed General Interventions Discussed, Doctor Visits, Communication with  [Evaluation of current treatment plan for health condition and patient's adherence to plan.]  Doctor Visits Discussed/Reviewed PCP, Specialist  PCP/Specialist Visits Compliance with follow-up visit  [reviewed upcoming  follow up appointments,  conference call with [facility] to obtain name and contact number of patient's [facility] navigator and assit with getting moms meals.]  Education Interventions   Education Provided Provided Education  Provided Verbal Education On Nutrition, Medication, When to see the doctor, Insurance Plans  [advised to take medications as prescribed, attend provider visits as scheduled, contact provider with health questions/concerns as needed]  Nutrition Interventions   Nutrition Discussed/Reviewed Nutrition Discussed  Pharmacy Interventions   Pharmacy Dicussed/Reviewed Pharmacy Topics Discussed, Medications and their functions, Referral to Pharmacist  [medication review with patient. provided information Elenor Legato is a generic version of Advair. RNCM encouraged patient to discuss medication questions with providers(scheduled to see next week)]  Safety Interventions   Safety Discussed/Reviewed Safety Discussed  [encouraged patient to discuss energy level with providers next week. discuss if physical therapy is recommended.]            SDOH assessments and interventions completed:  Yes recently completed. Patient denies any changes.   Care Coordination Interventions:  Yes, provided   Follow up plan: Follow up call scheduled for 08/29/22    Encounter Outcome:  Patient Visit Completed   [name], RN, MSN, BSN, CCM Care Management Coordinator [phone]

## 2023-08-06 ENCOUNTER — Telehealth: Payer: Self-pay

## 2023-08-06 NOTE — Progress Notes (Signed)
   Care Guide Note  08/06/2023 Name: Tanya Hogan MRN: [id number] DOB: 07-Sep-1950  Referred by: [name], MD Reason for referral : Care Coordination (Outreach to schedule with Pharm d )   Tanya Hogan is a 72 y.o. year old female who is a primary care patient of Linford Arnold, Barbarann Ehlers, MD. [name] was referred to the pharmacist for assistance related to HTN and DM.    Successful contact was made with the patient to discuss pharmacy services including being ready for the pharmacist to call at least 5 minutes before the scheduled appointment time, to have medication bottles and any blood sugar or blood pressure readings ready for review. The patient agreed to meet with the pharmacist via with the pharmacist via telephone visit on (date/time).  08/13/2023  Penne Lash , RMA     [place]  [facility] Guide  Direct Dial: [phone]  Website: [email or web address]

## 2023-08-06 NOTE — Progress Notes (Signed)
   Care Guide Note  08/06/2023 Name: Tanya Hogan MRN: [id number] DOB: [birth year]  Referred by: [name], MD Reason for referral : Care Coordination (Outreach to schedule with Pharm d )   Tanya Hogan is a 72 y.o. year old female who is a primary care patient of Linford Arnold, Barbarann Ehlers, MD. [name] was referred to the pharmacist for assistance related to HTN and DM.    An unsuccessful telephone outreach was attempted today to contact the patient who was referred to the pharmacy team for assistance with medication management. Additional attempts will be made to contact the patient.   Penne Lash , RMA     [facility]  [facility], [facility] Guide  Direct Dial: [phone]  Website: [email or web address]

## 2023-08-08 NOTE — Progress Notes (Unsigned)
HPI:  Current Outpatient Medications  Medication Sig Dispense Refill   albuterol (PROVENTIL) (2.5 MG/3ML) 0.083% nebulizer solution Take 2.5 mg by nebulization every 6 (six) hours as needed for wheezing or shortness of breath.     albuterol (VENTOLIN HFA) 108 (90 Base) MCG/ACT inhaler Inhale 1-2 puffs into the lungs every 6 (six) hours as needed for wheezing or shortness of breath. 54 g 3   alendronate (FOSAMAX) 70 MG tablet TAKE 1 TABLET BY MOUTH  EVERY 7 DAYS WITH A FULL  GLASS OF WATER ON AN EMPTY  STOMACH 12 tablet 3   AMBULATORY NON FORMULARY MEDICATION Medication Name: Tubing and supplies for nebulizer. Fax - [phone] 1 vial 0   amiodarone (PACERONE) 200 MG tablet Take 200 mg by mouth 2 (two) times daily.     aspirin 81 MG chewable tablet Chew 81 mg by mouth daily.     benzonatate (TESSALON) 200 MG capsule Take 1 capsule (200 mg total) by mouth 2 (two) times daily as needed for cough. 20 capsule 0   Benzoyl Peroxide (PANOXYL FOAMING WASH) 10 % LIQD Wash groin creases daily with this wash  Ok to sub something similar 142 g 1   bisacodyl 5 MG EC tablet Take 5 mg by mouth 2 (two) times daily.     Continuous Glucose Receiver (FREESTYLE LIBRE 3 READER) DEVI Use for continuous glucose monitoring 1 each 0   Continuous Glucose Sensor (FREESTYLE LIBRE 3 SENSOR) MISC by Does not apply route. Change every 14 days     EPIPEN 2-PAK 0.3 MG/0.3ML SOAJ injection Inject 0.3 mg into the muscle as needed for anaphylaxis.     fexofenadine (ALLEGRA) 180 MG tablet Take 180 mg by mouth daily.     fluticasone-salmeterol (WIXELA INHUB) 250-50 MCG/ACT AEPB Inhale 1 puff into the lungs in the morning and at bedtime. (Patient not taking: Reported on 08/05/2023)     glucose blood test strip To be used to check blood glucose Dx:E11.8 FreeStyle Precision blood glucose for Freestyle Libre 2 100 each 12   hydrALAZINE (APRESOLINE) 10 MG tablet Take 10 mg by mouth in the morning and at bedtime.     isosorbide  mononitrate (IMDUR) 30 MG 24 hr tablet Take 1 tablet by mouth daily.     levothyroxine (SYNTHROID) 112 MCG tablet Take 1 tablet (112 mcg total) by mouth daily. 90 tablet 0   LINZESS 290 MCG CAPS capsule Take 290 mcg by mouth daily at 2 am.     metFORMIN (GLUCOPHAGE-XR) 500 MG 24 hr tablet Take 1 tablet (500 mg total) by mouth 2 (two) times daily with a meal. 180 tablet 1   Multiple Vitamins-Minerals (OCUVITE EYE + MULTI) TABS Take 1 tablet by mouth in the morning.     nystatin cream (MYCOSTATIN) Apply 1 Application topically 2 (two) times daily as needed (rash). 30 g 3   pantoprazole (PROTONIX) 40 MG tablet TAKE 1 TABLET BY MOUTH DAILY  BEFORE BREAKFAST 90 tablet 3   pravastatin (PRAVACHOL) 40 MG tablet TAKE 1 TABLET BY MOUTH AT  BEDTIME 60 tablet 5   Tiotropium Bromide Monohydrate (SPIRIVA RESPIMAT) 1.25 MCG/ACT AERS Inhale 2 puffs into the lungs daily.     torsemide (DEMADEX) 10 MG tablet Take 1 tablet by mouth daily.     valACYclovir (VALTREX) 1000 MG tablet Take 1 tablet (1,000 mg total) by mouth 2 (two) times daily. 14 tablet 2   No current facility-administered medications for this visit.     Past  Medical History:  Diagnosis Date   Alkaline phosphatase elevation    Asthma    Breast cancer (HCC) 2011   Carpal tunnel syndrome    bilateral   Chronic HFrEF (heart failure with reduced ejection fraction) (HCC)    Cirrhosis, non-alcoholic (HCC)    Closed fracture of unspecified part of radius (alone)    DDD (degenerative disc disease), lumbar    Depression    pt denies   Diabetes mellitus without complication (HCC)    Enlarged heart    Fibromyalgia    GERD (gastroesophageal reflux disease)    History of kidney stones    HTN (hypertension)    Hypothyroidism    IBS (irritable bowel syndrome)    LBBB (left bundle branch block)    Microcytic anemia 05/14/2017   Mitral regurgitation    NICM (nonischemic cardiomyopathy) (HCC)    Pneumonia    Presence of permanent cardiac pacemaker     Pulmonary hypertension (HCC)    PVD (peripheral vascular disease) (HCC)    2019   Sleep apnea    uses cpap   Tricuspid regurgitation    Ventricular tachyarrhythmia [facility])     Past Surgical History:  Procedure Laterality Date   BIV PACEMAKER GENERATOR CHANGEOUT N/A 11/03/2021   Procedure: BIV PACEMAKER GENERATOR CHANGEOUT;  Surgeon: Marinus Maw, MD;  Location: [facility] INVASIVE CV LAB;  Service: Cardiovascular;  Laterality: N/A;   BREAST LUMPECTOMY Left 2011   COLONOSCOPY     FOOT SURGERY     KNEE SURGERY     LEAD REVISION/REPAIR N/A 11/03/2021   Procedure: LEAD REVISION/REPAIR;  Surgeon: Marinus Maw, MD;  Location: [facility] INVASIVE CV LAB;  Service: Cardiovascular;  Laterality: N/A;   MASTECTOMY Right    05/2019   PACEMAKER GENERATOR CHANGE N/A 08/23/2014   Procedure: PACEMAKER GENERATOR CHANGE;  Surgeon: Marinus Maw, MD;  Location: [facility] CATH LAB;  Service: Cardiovascular;  Laterality: N/A;   PACEMAKER PLACEMENT  2009   [facility]   RECTAL SURGERY  1976   REDUCTION MAMMAPLASTY Left    05/2019   TOTAL SHOULDER ARTHROPLASTY Right 03/10/2018   TOTAL SHOULDER ARTHROPLASTY Right 03/10/2018   Procedure: RIGHT TOTAL SHOULDER ARTHROPLASTY;  Surgeon: Jones Broom, MD;  Location: [facility] OR;  Service: Orthopedics;  Laterality: Right;   TUBAL LIGATION      Social History   Socioeconomic History   Marital status: Divorced    Spouse name: Not on file   Number of children: 2   Years of education: 14   Highest education level: Associate degree: academic program  Occupational History   Occupation: quality insurcance in Sports coach    Comment: retired  Tobacco Use   Smoking status: Former    Current packs/day: 0.00    Types: Cigarettes    Quit date: 08/24/1966    Years since quitting: 56.9   Smokeless tobacco: Never  Vaping Use   Vaping status: Never Used  Substance and Sexual Activity   Alcohol use: No   Drug use: No   Sexual activity: Not Currently  Other Topics  Concern   Not on file  Social History Narrative   Retired. Lives with and helps her brother. Likes Risk manager, Nutritional therapist and music. Loves animals.   Social Drivers of Corporate investment banker Strain: Low Risk  (04/09/2023)   Received from [facility]   Overall Financial Resource Strain (CARDIA)    Difficulty of Paying Living Expenses: Not hard at all  Food Insecurity: Low Risk  (  07/26/2023)   Received from [facility]   Hunger Vital Sign    Worried About Running Out of Food in the Last Year: Never true    Ran Out of Food in the Last Year: Never true  Transportation Needs: No Transportation Needs (07/26/2023)   Received from [facility]    In the past 12 months, has lack of reliable transportation kept you from medical appointments, meetings, work or from getting things needed for daily living? : No  Physical Activity: Insufficiently Active (07/12/2023)   Exercise Vital Sign    Days of Exercise per Week: 1 day    Minutes of Exercise per Session: 10 min  Stress: Stress Concern Present (07/12/2023)   [employer] of Occupational Health - Occupational Stress Questionnaire    Feeling of Stress : To some extent  Social Connections: Moderately Integrated (08/06/2022)   Social Connection and Isolation Panel [NHANES]    Frequency of Communication with Friends and Family: More than three times a week    Frequency of Social Gatherings with Friends and Family: Three times a week    Attends Religious Services: More than 4 times per year    Active Member of Clubs or Organizations: Yes    Attends Banker Meetings: More than 4 times per year    Marital Status: Divorced  Intimate Partner Violence: Not At Risk (08/05/2023)   Humiliation, Afraid, Rape, and Kick questionnaire    Fear of Current or Ex-Partner: No    Emotionally Abused: No    Physically Abused: No    Sexually Abused: No    Family History  Problem Relation Age of Onset   Heart  disease Mother    Melanoma Mother    Parkinson's disease Mother    Heart disease Father    Heart disease Brother    Heart failure Other    Breast cancer Other    Hypertension Other     ROS: no fevers or chills, productive cough, hemoptysis, dysphasia, odynophagia, melena, hematochezia, dysuria, hematuria, rash, seizure activity, orthopnea, PND, pedal edema, claudication. Remaining systems are negative.  Physical Exam: Well-developed well-nourished in no acute distress.  Skin is warm and dry.  HEENT is normal.  Neck is supple.  Chest is clear to auscultation with normal expansion.  Cardiovascular exam is regular rate and rhythm.  Abdominal exam nontender or distended. No masses palpated. Extremities show no edema. neuro grossly intact  ECG- personally reviewed  A/P  1  Tanya Millers, MD

## 2023-08-09 ENCOUNTER — Telehealth: Payer: Self-pay | Admitting: *Deleted

## 2023-08-09 NOTE — Progress Notes (Signed)
  Care Coordination   Note   08/09/2023 Name: Tanya Hogan MRN: [id number] DOB: 09/03/1950  Tanya Hogan is a 72 y.o. year old female who sees [name], Barbarann Ehlers, MD for primary care. I reached out to [name] by phone today to offer care coordination services.  [name] was given information about Care Coordination services today including:   The Care Coordination services include support from the care team which includes your Nurse Coordinator, Clinical Social Worker, or Pharmacist.  The Care Coordination team is here to help remove barriers to the health concerns and goals most important to you. Care Coordination services are voluntary, and the patient may decline or stop services at any time by request to their care team member.   Care Coordination Consent Status: Patient agreed to services and verbal consent obtained.   Follow up plan:  Telephone appointment with care coordination team member scheduled for:  08/23/2023  Encounter Outcome:  Patient Scheduled  Burman Nieves, [facility] Care Coordination Care Guide Direct Dial: [phone]

## 2023-08-10 ENCOUNTER — Ambulatory Visit: Payer: 59 | Attending: Internal Medicine | Admitting: Internal Medicine

## 2023-08-10 ENCOUNTER — Encounter: Payer: Self-pay | Admitting: Family Medicine

## 2023-08-10 ENCOUNTER — Ambulatory Visit (INDEPENDENT_AMBULATORY_CARE_PROVIDER_SITE_OTHER): Payer: Medicare Other | Admitting: Family Medicine

## 2023-08-10 VITALS — Ht 61.0 in | Wt 210.0 lb

## 2023-08-10 VITALS — BP 105/64 | HR 72 | Ht 61.0 in | Wt 214.2 lb

## 2023-08-10 DIAGNOSIS — I5082 Biventricular heart failure: Secondary | ICD-10-CM | POA: Diagnosis not present

## 2023-08-10 DIAGNOSIS — I472 Ventricular tachycardia, unspecified: Secondary | ICD-10-CM

## 2023-08-10 DIAGNOSIS — R0600 Dyspnea, unspecified: Secondary | ICD-10-CM | POA: Diagnosis not present

## 2023-08-10 DIAGNOSIS — Z0000 Encounter for general adult medical examination without abnormal findings: Secondary | ICD-10-CM

## 2023-08-10 DIAGNOSIS — G4733 Obstructive sleep apnea (adult) (pediatric): Secondary | ICD-10-CM | POA: Diagnosis not present

## 2023-08-10 DIAGNOSIS — J4540 Moderate persistent asthma, uncomplicated: Secondary | ICD-10-CM | POA: Diagnosis not present

## 2023-08-10 NOTE — Progress Notes (Signed)
HPI [name] returns today for followup. She is a pleasant  72 yo woman with chronic systolic heart failure and CHB, s/p Biv PPM insertion. She has had some increased thresholds and we upgraded her to a left bundle area lead in the RV port in conjunction with her old RA and LV leads.She has breast CA. She is s/p surgery. She has also had a breast resection.No syncope. Her dyspnea is improved. She was hospitalized several weeks ago with sustained VT. In view of her multiple comorbidities she has not been thought to be a good candidate for ICD upgrade and was placed on amiodarone. She has done well in the interim. She admits to some dietary indiscretion with sodium. Her weight is up about 5 lbs.  Allergies  Allergen Reactions   Injectafer [Ferric Carboxymaltose] Shortness Of Breath    Mild SOB following injectafer infusion Mild SOB following injectafer infusion    Penicillins Anaphylaxis and Rash    Has patient had a PCN reaction causing immediate rash, facial/tongue/throat swelling, SOB or lightheadedness with hypotension: Yes Has patient had a PCN reaction causing severe rash involving mucus membranes or skin necrosis: No Has patient had a PCN reaction that required hospitalization: No Has patient had a PCN reaction occurring within the last 10 years: No If all of the above answers are "NO", then may proceed with Cephalosporin use. "Stuttered with medication"     Accupril [Quinapril Hcl] Other (See Comments)    Hyperkalemia=high potassium level   Aleve [Naproxen] Swelling   Basle Rash   Celebrex [Celecoxib]     ulcers   Cozaar [Losartan Potassium] Itching   Flonase [Fluticasone] Other (See Comments)    Nasal ulcer.     Lactose Intolerance (Gi) Other (See Comments)    Increased calcium, sneezing, nasal issues   Levomenol Other (See Comments)    unknown unknown    Lipitor [Atorvastatin] Other (See Comments)    muscle aches/pains   Nasonex [Mometasone Furoate] Other (See  Comments)    nosebleed   Xyzal [Levocetirizine] Other (See Comments)    Doesn't recall reaction type   Bentyl [Dicyclomine]    Chamomile Other (See Comments)    unknown   Entresto [Sacubitril-Valsartan]     Hyperkalemia   Vesicare [Solifenacin] Other (See Comments)    "stopped me from urinating"   Aspirin Other (See Comments)    Pt states "burns holes in stomach", ulcers    Baking Soda-Fluoride [Sodium Fluoride] Itching and Rash   Clindamycin/Lincomycin Rash   Codeine Nausea And Vomiting   Furosemide Other (See Comments)    Nose bleed   Lactose Other (See Comments)   Lanolin-Petrolatum Rash   Miralax [Polyethylene Glycol] Rash   Quinine Rash   Reclast [Zoledronic Acid] Rash and Other (See Comments)    shaking   Rifampin Other (See Comments)    DOES NOT REMEMBER THIS MEDICATION OR ALLERGY   Sulfa Antibiotics Rash   Sulfonamide Derivatives Rash   Tramadol Rash    Anxiety   Wellbutrin [Bupropion] Other (See Comments)    Muscle aches, pain, "made my skin crawl"   Wool Alcohol [Lanolin] Rash     Current Outpatient Medications  Medication Sig Dispense Refill   albuterol (PROVENTIL) (2.5 MG/3ML) 0.083% nebulizer solution Take 2.5 mg by nebulization every 6 (six) hours as needed for wheezing or shortness of breath.     albuterol (VENTOLIN HFA) 108 (90 Base) MCG/ACT inhaler Inhale 1-2 puffs into the lungs every 6 (six) hours as  needed for wheezing or shortness of breath. 54 g 3   alendronate (FOSAMAX) 70 MG tablet TAKE 1 TABLET BY MOUTH  EVERY 7 DAYS WITH A FULL  GLASS OF WATER ON AN EMPTY  STOMACH 12 tablet 3   AMBULATORY NON FORMULARY MEDICATION Medication Name: Tubing and supplies for nebulizer. Fax - [phone] 1 vial 0   amiodarone (PACERONE) 200 MG tablet Take 200 mg by mouth 2 (two) times daily.     aspirin 81 MG chewable tablet Chew 81 mg by mouth daily.     benzonatate (TESSALON) 200 MG capsule Take 1 capsule (200 mg total) by mouth 2 (two) times daily as needed for  cough. 20 capsule 0   Benzoyl Peroxide (PANOXYL FOAMING WASH) 10 % LIQD Wash groin creases daily with this wash  Ok to sub something similar 142 g 1   bisacodyl 5 MG EC tablet Take 5 mg by mouth 2 (two) times daily.     Continuous Glucose Receiver (FREESTYLE LIBRE 3 READER) DEVI Use for continuous glucose monitoring 1 each 0   Continuous Glucose Sensor (FREESTYLE LIBRE 3 SENSOR) MISC by Does not apply route. Change every 14 days     EPIPEN 2-PAK 0.3 MG/0.3ML SOAJ injection Inject 0.3 mg into the muscle as needed for anaphylaxis.     fexofenadine (ALLEGRA) 180 MG tablet Take 180 mg by mouth daily.     fluticasone-salmeterol (WIXELA INHUB) 250-50 MCG/ACT AEPB Inhale 1 puff into the lungs in the morning and at bedtime.     glucose blood test strip To be used to check blood glucose Dx:E11.8 FreeStyle Precision blood glucose for Freestyle Libre 2 100 each 12   hydrALAZINE (APRESOLINE) 10 MG tablet Take 10 mg by mouth in the morning and at bedtime.     isosorbide mononitrate (IMDUR) 30 MG 24 hr tablet Take 1 tablet by mouth daily.     levothyroxine (SYNTHROID) 112 MCG tablet Take 1 tablet (112 mcg total) by mouth daily. 90 tablet 0   LINZESS 290 MCG CAPS capsule Take 290 mcg by mouth daily at 2 am.     metFORMIN (GLUCOPHAGE-XR) 500 MG 24 hr tablet Take 1 tablet (500 mg total) by mouth 2 (two) times daily with a meal. 180 tablet 1   Multiple Vitamins-Minerals (OCUVITE EYE + MULTI) TABS Take 1 tablet by mouth in the morning.     nystatin cream (MYCOSTATIN) Apply 1 Application topically 2 (two) times daily as needed (rash). 30 g 3   pantoprazole (PROTONIX) 40 MG tablet TAKE 1 TABLET BY MOUTH DAILY  BEFORE BREAKFAST 90 tablet 3   pravastatin (PRAVACHOL) 40 MG tablet TAKE 1 TABLET BY MOUTH AT  BEDTIME 60 tablet 5   Tiotropium Bromide Monohydrate (SPIRIVA RESPIMAT) 1.25 MCG/ACT AERS Inhale 2 puffs into the lungs daily.     torsemide (DEMADEX) 10 MG tablet Take 1 tablet by mouth daily.     valACYclovir  (VALTREX) 1000 MG tablet Take 1 tablet (1,000 mg total) by mouth 2 (two) times daily. 14 tablet 2   No current facility-administered medications for this visit.     Past Medical History:  Diagnosis Date   Alkaline phosphatase elevation    Asthma    Breast cancer (HCC) 2011   Carpal tunnel syndrome    bilateral   Chronic HFrEF (heart failure with reduced ejection fraction) (HCC)    Cirrhosis, non-alcoholic (HCC)    Closed fracture of unspecified part of radius (alone)    DDD (degenerative disc disease), lumbar  Depression    pt denies   Diabetes mellitus without complication (HCC)    Enlarged heart    Fibromyalgia    GERD (gastroesophageal reflux disease)    History of kidney stones    HTN (hypertension)    Hypothyroidism    IBS (irritable bowel syndrome)    LBBB (left bundle branch block)    Microcytic anemia 05/14/2017   Mitral regurgitation    NICM (nonischemic cardiomyopathy) (HCC)    Pneumonia    Presence of permanent cardiac pacemaker    Pulmonary hypertension (HCC)    PVD (peripheral vascular disease) (HCC)    2019   Sleep apnea    uses cpap   Tricuspid regurgitation    Ventricular tachyarrhythmia (HCC)     ROS:   All systems reviewed and negative except as noted in the HPI.   Past Surgical History:  Procedure Laterality Date   BIV PACEMAKER GENERATOR CHANGEOUT N/A 11/03/2021   Procedure: BIV PACEMAKER GENERATOR CHANGEOUT;  Surgeon: Marinus Maw, MD;  Location: [facility];  Service: Cardiovascular;  Laterality: N/A;   BREAST LUMPECTOMY Left 2011   COLONOSCOPY     FOOT SURGERY     KNEE SURGERY     LEAD REVISION/REPAIR N/A 11/03/2021   Procedure: LEAD REVISION/REPAIR;  Surgeon: Marinus Maw, MD;  Location: [facility];  Service: Cardiovascular;  Laterality: N/A;   MASTECTOMY Right    05/2019   PACEMAKER GENERATOR CHANGE N/A 08/23/2014   Procedure: PACEMAKER GENERATOR CHANGE;  Surgeon: Marinus Maw, MD;  Location: [facility] CATH [facility];   Service: Cardiovascular;  Laterality: N/A;   PACEMAKER PLACEMENT  2009    cardiology   RECTAL SURGERY  1976   REDUCTION MAMMAPLASTY Left    05/2019   TOTAL SHOULDER ARTHROPLASTY Right 03/10/2018   TOTAL SHOULDER ARTHROPLASTY Right 03/10/2018   Procedure: RIGHT TOTAL SHOULDER ARTHROPLASTY;  Surgeon: Jones Broom, MD;  Location: [facility] OR;  Service: Orthopedics;  Laterality: Right;   TUBAL LIGATION       Family History  Problem Relation Age of Onset   Heart disease Mother    Melanoma Mother    Parkinson's disease Mother    Heart disease Father    Heart disease Brother    Heart failure Other    Breast cancer Other    Hypertension Other      Social History   Socioeconomic History   Marital status: Divorced    Spouse name: Not on file   Number of children: 2   Years of education: 14   Highest education level: Associate degree: academic program  Occupational History   Occupation: quality insurcance in Sports coach    Comment: retired  Tobacco Use   Smoking status: Former    Current packs/day: 0.00    Types: Cigarettes    Quit date: 08/24/1966    Years since quitting: 57.0   Smokeless tobacco: Never  Vaping Use   Vaping status: Never Used  Substance and Sexual Activity   Alcohol use: No   Drug use: No   Sexual activity: Not Currently  Other Topics Concern   Not on file  Social History Narrative   Retired. Lives with and helps her brother. Likes Risk manager, Nutritional therapist and music. Loves animals.   Social Drivers of Health   Financial Resource Strain: Medium Risk (08/10/2023)   Overall Financial Resource Strain (CARDIA)    Difficulty of Paying Living Expenses: Somewhat hard  Food Insecurity: No Food Insecurity (08/10/2023)  Hunger Vital Sign    Worried About Running Out of Food in the Last Year: Never true    Ran Out of Food in the Last Year: Never true  Transportation Needs: No Transportation Needs (08/10/2023)   PRAPARE - Therapist, art (Medical): No    Lack of Transportation (Non-Medical): No  Physical Activity: Sufficiently Active (08/10/2023)   Exercise Vital Sign    Days of Exercise per Week: 3 days    Minutes of Exercise per Session: 50 min  Recent Concern: Physical Activity - Insufficiently Active (07/12/2023)   Exercise Vital Sign    Days of Exercise per Week: 1 day    Minutes of Exercise per Session: 10 min  Stress: Stress Concern Present (08/10/2023)   [employer] of Occupational Health - Occupational Stress Questionnaire    Feeling of Stress : To some extent  Social Connections: Moderately Integrated (08/10/2023)   Social Connection and Isolation Panel [NHANES]    Frequency of Communication with Friends and Family: More than three times a week    Frequency of Social Gatherings with Friends and Family: More than three times a week    Attends Religious Services: More than 4 times per year    Active Member of [employer] or Organizations: Yes    Attends Engineer, structural: More than 4 times per year    Marital Status: Divorced  Intimate Partner Violence: Not At Risk (08/10/2023)   Humiliation, Afraid, Rape, and Kick questionnaire    Fear of Current or Ex-Partner: No    Emotionally Abused: No    Physically Abused: No    Sexually Abused: No     BP 105/64   Pulse 72   Ht 5\' 1"  (1.549 m)   Wt 214 lb 3.2 oz (97.2 kg)   SpO2 96%   BMI 40.47 kg/m   Physical Exam:  Chronically ill appearing NAD HEENT: Unremarkable Neck:  No JVD, no thyromegally Lymphatics:  No adenopathy Back:  No CVA tenderness Lungs:  Clear with no wheezes HEART:  Regular rate rhythm, no murmurs, no rubs, no clicks Abd:  soft, positive bowel sounds, no organomegally, no rebound, no guarding Ext:  2 plus pulses, no edema, no cyanosis, no clubbing Skin:  No rashes no nodules Neuro:  CN II through XII intact, motor grossly intact  EKG - P synchronous ventricular pacing  DEVICE  Normal device  function.  See PaceArt for details.   Assess/Plan:  Chronic systolic heart failure - her symptoms are improved after placement of a left bundle area lead. She will undergo watchful waiting.   Biv PPM - her device is working normally and she has almost 6 years of battery longevity.  3.   Obesity her bmi is 40![name] She will need to work    on     this. She was taking ozempic. Now she is off of this. 4. PVC's - these are stable as she is v. Paced 99%.    Sharlot Gowda Amato Sevillano,MD

## 2023-08-10 NOTE — Progress Notes (Signed)
Subjective:   Tanya Hogan is a 72 y.o. female who presents for Medicare Annual (Subsequent) preventive examination.  Visit Complete: Virtual I connected with  [name] on 08/10/23 by a audio enabled telemedicine application and verified that I am speaking with the correct person using two identifiers.  Patient Location: Other:  car  Provider Location: Office/Clinic  I discussed the limitations of evaluation and management by telemedicine. The patient expressed understanding and agreed to proceed.  Vital Signs: Because this visit was a virtual/telehealth visit, some criteria may be missing or patient reported. Any vitals not documented were not able to be obtained and vitals that have been documented are patient reported.  Patient Medicare AWV questionnaire was completed by the patient on n/a; I have confirmed that all information answered by patient is correct and no changes since this date.  Cardiac Risk Factors include: advanced age (>29men, >47 women);diabetes mellitus;hypertension;sedentary lifestyle;obesity (BMI >30kg/m2)     Objective:    Today's Vitals   08/10/23 1305 08/10/23 1306  Weight: 210 lb (95.3 kg)   Height: 5\' 1"  (1.549 m)   PainSc:  5    Body mass index is 39.68 kg/m.     08/10/2023    1:25 PM 04/23/2023   11:34 AM 12/26/2022   10:54 AM 12/24/2022    2:16 PM 10/29/2022    2:28 PM 09/11/2022    5:42 PM 08/06/2022    1:09 PM  Advanced Directives  Does Patient Have a Medical Advance Directive? No No No No No No No  Would patient like information on creating a medical advance directive? No - Patient declined No - Patient declined Yes (ED - Information included in AVS)  No - Patient declined  No - Patient declined    Current Medications (verified) Outpatient Encounter Medications as of 08/10/2023  Medication Sig   albuterol (PROVENTIL) (2.5 MG/3ML) 0.083% nebulizer solution Take 2.5 mg by nebulization every 6 (six) hours as needed for wheezing or  shortness of breath.   albuterol (VENTOLIN HFA) 108 (90 Base) MCG/ACT inhaler Inhale 1-2 puffs into the lungs every 6 (six) hours as needed for wheezing or shortness of breath.   alendronate (FOSAMAX) 70 MG tablet TAKE 1 TABLET BY MOUTH  EVERY 7 DAYS WITH A FULL  GLASS OF WATER ON AN EMPTY  STOMACH   AMBULATORY NON FORMULARY MEDICATION Medication Name: Tubing and supplies for nebulizer. Fax - [phone]   amiodarone (PACERONE) 200 MG tablet Take 200 mg by mouth 2 (two) times daily.   aspirin 81 MG chewable tablet Chew 81 mg by mouth daily.   Benzoyl Peroxide (PANOXYL FOAMING WASH) 10 % LIQD Wash groin creases daily with this wash  Ok to sub something similar   bisacodyl 5 MG EC tablet Take 5 mg by mouth 2 (two) times daily.   Continuous Glucose Receiver (FREESTYLE LIBRE 3 READER) DEVI Use for continuous glucose monitoring   Continuous Glucose Sensor (FREESTYLE LIBRE 3 SENSOR) MISC by Does not apply route. Change every 14 days   EPIPEN 2-PAK 0.3 MG/0.3ML SOAJ injection Inject 0.3 mg into the muscle as needed for anaphylaxis.   fexofenadine (ALLEGRA) 180 MG tablet Take 180 mg by mouth daily.   glucose blood test strip To be used to check blood glucose Dx:E11.8 FreeStyle Precision blood glucose for Freestyle Libre 2   hydrALAZINE (APRESOLINE) 10 MG tablet Take 10 mg by mouth in the morning and at bedtime.   isosorbide mononitrate (IMDUR) 30 MG 24 hr tablet Take  1 tablet by mouth daily.   levothyroxine (SYNTHROID) 112 MCG tablet Take 1 tablet (112 mcg total) by mouth daily.   LINZESS 290 MCG CAPS capsule Take 290 mcg by mouth daily at 2 am.   metFORMIN (GLUCOPHAGE-XR) 500 MG 24 hr tablet Take 1 tablet (500 mg total) by mouth 2 (two) times daily with a meal.   Multiple Vitamins-Minerals (OCUVITE EYE + MULTI) TABS Take 1 tablet by mouth in the morning.   pantoprazole (PROTONIX) 40 MG tablet TAKE 1 TABLET BY MOUTH DAILY  BEFORE BREAKFAST   pravastatin (PRAVACHOL) 40 MG tablet TAKE 1 TABLET BY MOUTH  AT  BEDTIME   Tiotropium Bromide Monohydrate (SPIRIVA RESPIMAT) 1.25 MCG/ACT AERS Inhale 2 puffs into the lungs daily.   torsemide (DEMADEX) 10 MG tablet Take 1 tablet by mouth daily.   benzonatate (TESSALON) 200 MG capsule Take 1 capsule (200 mg total) by mouth 2 (two) times daily as needed for cough. (Patient not taking: Reported on 08/10/2023)   fluticasone-salmeterol (WIXELA INHUB) 250-50 MCG/ACT AEPB Inhale 1 puff into the lungs in the morning and at bedtime. (Patient not taking: Reported on 08/10/2023)   nystatin cream (MYCOSTATIN) Apply 1 Application topically 2 (two) times daily as needed (rash). (Patient not taking: Reported on 08/10/2023)   valACYclovir (VALTREX) 1000 MG tablet Take 1 tablet (1,000 mg total) by mouth 2 (two) times daily. (Patient not taking: Reported on 08/10/2023)   No facility-administered encounter medications on file as of 08/10/2023.    Allergies (verified) Injectafer [ferric carboxymaltose], Penicillins, Accupril [quinapril hcl], Aleve [naproxen], Basle, Celebrex [celecoxib], Cozaar [losartan potassium], Flonase [fluticasone], Lactose intolerance (gi), Levomenol, Lipitor [atorvastatin], Nasonex [mometasone furoate], Xyzal [levocetirizine], Bentyl [dicyclomine], Chamomile, Entresto [sacubitril-valsartan], Vesicare [solifenacin], Aspirin, Baking soda-fluoride [sodium fluoride], Clindamycin/lincomycin, Codeine, Furosemide, Lactose, Lanolin-petrolatum, Miralax [polyethylene glycol], Quinine, Reclast [zoledronic acid], Rifampin, Sulfa antibiotics, Sulfonamide derivatives, Tramadol, Wellbutrin [bupropion], and Wool alcohol [lanolin]   History: Past Medical History:  Diagnosis Date   Alkaline phosphatase elevation    Asthma    Breast cancer (HCC) 2011   Carpal tunnel syndrome    bilateral   Chronic HFrEF (heart failure with reduced ejection fraction) (HCC)    Cirrhosis, non-alcoholic (HCC)    Closed fracture of unspecified part of radius (alone)    DDD (degenerative  disc disease), lumbar    Depression    pt denies   Diabetes mellitus without complication (HCC)    Enlarged heart    Fibromyalgia    GERD (gastroesophageal reflux disease)    History of kidney stones    HTN (hypertension)    Hypothyroidism    IBS (irritable bowel syndrome)    LBBB (left bundle branch block)    Microcytic anemia 05/14/2017   Mitral regurgitation    NICM (nonischemic cardiomyopathy) (HCC)    Pneumonia    Presence of permanent cardiac pacemaker    Pulmonary hypertension (HCC)    PVD (peripheral vascular disease) (HCC)    2019   Sleep apnea    uses cpap   Tricuspid regurgitation    Ventricular tachyarrhythmia [facility])    Past Surgical History:  Procedure Laterality Date   BIV PACEMAKER GENERATOR CHANGEOUT N/A 11/03/2021   Procedure: BIV PACEMAKER GENERATOR CHANGEOUT;  Surgeon: Marinus Maw, MD;  Location: [facility] INVASIVE CV LAB;  Service: Cardiovascular;  Laterality: N/A;   BREAST LUMPECTOMY Left 2011   COLONOSCOPY     FOOT SURGERY     KNEE SURGERY     LEAD REVISION/REPAIR N/A 11/03/2021   Procedure: LEAD REVISION/REPAIR;  Surgeon: Marinus Maw, MD;  Location[facility] INVASIVE CV LAB;  Service: Cardiovascular;  Laterality: N/A;   MASTECTOMY Right    05/2019   PACEMAKER GENERATOR CHANGE N/A 08/23/2014   Procedure: PACEMAKER GENERATOR CHANGE;  Surgeon: Marinus Maw, MD;  Location: [facility] CATH LAB;  Service: Cardiovascular;  Laterality: N/A;   PACEMAKER PLACEMENT  2009   [facility]   RECTAL SURGERY  1976   REDUCTION MAMMAPLASTY Left    05/2019   TOTAL SHOULDER ARTHROPLASTY Right 03/10/2018   TOTAL SHOULDER ARTHROPLASTY Right 03/10/2018   Procedure: RIGHT TOTAL SHOULDER ARTHROPLASTY;  Surgeon: Jones Broom, MD;  Location: [facility] OR;  Service: Orthopedics;  Laterality: Right;   TUBAL LIGATION     Family History  Problem Relation Age of Onset   Heart disease Mother    Melanoma Mother    Parkinson's disease Mother    Heart disease Father    Heart disease  Brother    Heart failure Other    Breast cancer Other    Hypertension Other    Social History   Socioeconomic History   Marital status: Divorced    Spouse name: Not on file   Number of children: 2   Years of education: 14   Highest education level: Associate degree: academic program  Occupational History   Occupation: quality insurcance in Sports coach    Comment: retired  Tobacco Use   Smoking status: Former    Current packs/day: 0.00    Types: Cigarettes    Quit date: 08/24/1966    Years since quitting: 57.0   Smokeless tobacco: Never  Vaping Use   Vaping status: Never Used  Substance and Sexual Activity   Alcohol use: No   Drug use: No   Sexual activity: Not Currently  Other Topics Concern   Not on file  Social History Narrative   Retired. Lives with and helps her brother. Likes Risk manager, Nutritional therapist and music. Loves animals.   Social Drivers of Health   Financial Resource Strain: Medium Risk (08/10/2023)   Overall Financial Resource Strain (CARDIA)    Difficulty of Paying Living Expenses: Somewhat hard  Food Insecurity: No Food Insecurity (08/10/2023)   Hunger Vital Sign    Worried About Running Out of Food in the Last Year: Never true    Ran Out of Food in the Last Year: Never true  Transportation Needs: No Transportation Needs (08/10/2023)   PRAPARE - Administrator, Civil Service (Medical): No    Lack of Transportation (Non-Medical): No  Physical Activity: Sufficiently Active (08/10/2023)   Exercise Vital Sign    Days of Exercise per Week: 3 days    Minutes of Exercise per Session: 50 min  Recent Concern: Physical Activity - Insufficiently Active (07/12/2023)   Exercise Vital Sign    Days of Exercise per Week: 1 day    Minutes of Exercise per Session: 10 min  Stress: Stress Concern Present (08/10/2023)   [employer] of Occupational Health - Occupational Stress Questionnaire    Feeling of Stress : To some extent  Social  Connections: Moderately Integrated (08/10/2023)   Social Connection and Isolation Panel [NHANES]    Frequency of Communication with Friends and Family: More than three times a week    Frequency of Social Gatherings with Friends and Family: More than three times a week    Attends Religious Services: More than 4 times per year    Active Member of Clubs or Organizations: Yes    Attends  Engineer, structural: More than 4 times per year    Marital Status: Divorced    Tobacco Counseling Counseling given: Not Answered   Clinical Intake:  Pre-visit preparation completed: No  Pain : 0-10 Pain Score: 5  Pain Type: Chronic pain Pain Location: Back Pain Orientation: Lower Pain Onset: More than a month ago Pain Frequency: Intermittent Pain Relieving Factors: FOB diet Effect of Pain on Daily Activities: from time to time if she eats spicy foods  Pain Relieving Factors: FOB diet  BMI - recorded: 36.7 Nutritional Status: BMI > 30  Obese Nutritional Risks: None Diabetes: Yes CBG done?: No (virtual) Did pt. bring in CBG monitor from home?: Yes (127 this morning) Glucose Meter Downloaded?: No  How often do you need to have someone help you when you read instructions, pamphlets, or other written materials from your doctor or pharmacy?: 1 - Never What is the last grade level you completed in school?: 16  Interpreter Needed?: No      Activities of Daily Living    08/10/2023    1:10 PM 10/29/2022    9:00 PM  In your present state of health, do you have any difficulty performing the following activities:  Hearing? 0 1  Vision? 0 1  Difficulty concentrating or making decisions? 0 0  Walking or climbing stairs? 1 1  Comment avoids stairs, walks short distances without issues   Dressing or bathing? 1 0  Comment needs help with socks.   Doing errands, shopping? 1 0  Comment son is driving her to appointments   Preparing Food and eating ? N   Using the Toilet? N   Managing  your Medications? N   Managing your Finances? N   Housekeeping or managing your Housekeeping? Y   Comment needs assistance with home managment     Patient Care Team: [name], MD as PCP - General (Family Medicine) Meriam Sprague, MD (Inactive) as PCP - Cardiology (Cardiology) Marinus Maw, MD as PCP - Electrophysiology (Cardiology) Drue Second, MD (Hematology and Oncology) Jens Som Madolyn Frieze, MD as Attending Physician (Cardiology) Marinus Maw, MD (Cardiology) Dr. Madelynn Done (Otolaryngology) Chauncy Passy, MD as Consulting Physician (Hematology and Oncology) Ray Church, MD as Referring Physician (Gastroenterology) Raynald Blend, OD as Referring Physician (Optometry) Gabriel Carina, [facility] (Inactive) as Pharmacist (Pharmacist) Pa, [facility] (Ophthalmology) Colletta Maryland, RN as [facility] Care Management Darrick Huntsman, pulmonary  Mosetta Putt, digestive health    Indicate any recent Medical Services you may have received from other than [facility] providers in the past year (date may be approximate).     Assessment:   This is a routine wellness examination for Elleah.  Hearing/Vision screen Hearing Screening - Comments:: Unable to test, grossly intact Vision Screening - Comments:: Unable to test, wears reading glasses   Goals Addressed             This Visit's Progress    Mobility and Independence Optimized       Evidence-based guidance:  Refer to physical therapy or occupational therapy for assessment and individualized program.  Provide therapy that may include functional task training, balance, active or passive exercise, spasticity management, assistive device training, cardiorespiratory fitness, Pilates and telerehabilitation services.  Identify barriers to participation in therapy or exercise such as pain with activity, anticipated or imagined pain, transportation, depression or fear.  Work with patient and family to develop  self-management plan to remove barriers.  Refer to speech/language pathologist to assess  and treat speech/language deficit and swallowing impairment.  Periodically review ability to perform activities of daily living and required amount of assistance needed for safety, optimal independence and self-care.  Encourage appropriate vocational or educational counseling for re-entering the community, workplace or school; consider a driving evaluation.  Assist patient to advocate for necessary adaptations to the work or school environment.  Refer to employer for information about FMLA (Family and Medical Leave Act), Short or Long-Term Disability and options for adjustments to work schedule, assignment or hours.   Notes: wants to restart exercise routine.       Depression Screen    08/10/2023    1:23 PM 07/12/2023    1:23 PM 06/23/2023    3:20 PM 06/14/2023    1:17 PM 02/16/2023    2:06 PM 11/25/2022   10:42 AM 10/27/2022    2:05 PM  PHQ 2/9 Scores  PHQ - 2 Score 0 2 0 2 0 0 0  PHQ- 9 Score  7 0 7       Fall Risk    08/10/2023    1:27 PM 02/16/2023    2:05 PM 12/03/2022    4:10 PM 11/25/2022   10:42 AM 10/27/2022    2:03 PM  Fall Risk   Falls in the past year? 1 1 1 1 1   Number falls in past yr: 1 1 1 1  0  Injury with Fall? 0 1  0 1  Comment     bruised ribs  Risk for fall due to : History of fall(s) Impaired mobility;History of fall(s) History of fall(s) History of fall(s) No Fall Risks  Risk for fall due to: Comment takes balance classes through senior center      Follow up  Falls evaluation completed Falls evaluation completed;Education provided;Falls prevention discussed Falls evaluation completed Falls evaluation completed    MEDICARE RISK AT HOME: Medicare Risk at Home Any stairs in or around the home?: No If so, are there any without handrails?: No Home free of loose throw rugs in walkways, pet beds, electrical cords, etc?: Yes Adequate lighting in your home to reduce risk of  falls?: Yes Life alert?: No Use of a cane, [place] or w/c?: Yes ([place]) Grab bars in the bathroom?: Yes Shower chair or bench in shower?: Yes Elevated toilet seat or a handicapped toilet?: Yes  TIMED UP AND GO:  Was the test performed?  No    Cognitive Function:        08/10/2023    1:29 PM 08/06/2022    1:19 PM 02/25/2021    8:46 AM 01/16/2020   10:30 AM 10/10/2018   11:31 AM  6CIT Screen  What Year? 0 points 0 points 0 points 0 points 0 points  What month? 0 points 0 points 0 points 0 points 0 points  What time? 0 points 0 points 0 points 0 points 0 points  Count back from 20 0 points 0 points 0 points 0 points 0 points  Months in reverse 0 points 2 points 2 points 2 points 2 points  Repeat phrase 2 points 4 points 2 points 2 points 0 points  Total Score 2 points 6 points 4 points 4 points 2 points    Immunizations Immunization History  Administered Date(s) Administered   Fluad Quad(high Dose 65+) 04/11/2019, 05/02/2020, 04/18/2021, 04/24/2021   Influenza Split 05/10/2012, 05/04/2014   Influenza Whole 06/14/2009, 05/08/2010, 04/24/2011   Influenza, High Dose Seasonal PF 05/17/2017, 05/24/2018, 04/11/2019, 05/02/2020, 05/30/2022   Influenza,inj,Quad PF,6+ Mos 04/28/2013,  05/04/2014, 05/06/2015, 05/18/2016   Influenza,trivalent, recombinat, inj, PF 05/12/2023   Moderna Covid-19 Vaccine Bivalent Booster 56yrs & up 05/30/2022   Moderna SARS-COV2 Booster Vaccination 10/29/2021   Moderna Sars-Covid-2 Vaccination 11/01/2019, 11/29/2019, 07/19/2020   Pfizer Fall 2023 Covid-19 Vaccine 44yrs thru 89yrs. 05/12/2023   Pneumococcal Conjugate-13 05/25/2014   Pneumococcal Polysaccharide-23 11/28/2012, 12/28/2018   Td 08/24/2004, 03/07/2010   Tdap 07/14/2012, 07/27/2022   Zoster Recombinant(Shingrix) 10/06/2017, 01/16/2020   Zoster, Live 04/24/2011    TDAP status: Up to date  Flu Vaccine status: Up to date  Pneumococcal vaccine status: Up to date  Covid-19 vaccine status:  Declined, Education has been provided regarding the importance of this vaccine but patient still declined. Advised may receive this vaccine at local pharmacy or Health Dept.or vaccine clinic. Aware to provide a copy of the vaccination record if obtained from local pharmacy or Health Dept. Verbalized acceptance and understanding.  Qualifies for Shingles Vaccine? Yes   Zostavax completed Yes   Shingrix Completed?: Yes  Screening Tests Health Maintenance  Topic Date Due   COVID-19 Vaccine (5 - 2024-25 season) 07/07/2023   HEMOGLOBIN A1C  11/17/2023   OPHTHALMOLOGY EXAM  01/20/2024   Diabetic kidney evaluation - Urine ACR  02/16/2024   Colonoscopy  04/06/2024   FOOT EXAM  05/19/2024   Diabetic kidney evaluation - eGFR measurement  08/03/2024   Medicare Annual Wellness (AWV)  08/09/2024   MAMMOGRAM  01/11/2025   DTaP/Tdap/Td (5 - Td or Tdap) 07/27/2032   Pneumonia Vaccine 16+ Years old  Completed   INFLUENZA VACCINE  Completed   DEXA SCAN  Completed   Hepatitis C Screening  Completed   Zoster Vaccines- Shingrix  Completed   HPV VACCINES  Aged Out    Health Maintenance  Health Maintenance Due  Topic Date Due   COVID-19 Vaccine (5 - 2024-25 season) 07/07/2023    Colorectal cancer screening: Type of screening: Colonoscopy. Completed 03/28/2020. Repeat every aged out  years  Mammogram status: Completed 01/12/2023. Repeat every year  Bone Density status: Completed 01/08/23. Results reflect: Bone density results: OSTEOPOROSIS. Repeat every 2 years.  Lung Cancer Screening: (Low Dose CT Chest recommended if Age 65-80 years, 20 pack-year currently smoking OR have quit w/in 15years.) does not qualify.   Lung Cancer Screening Referral: n/a  Additional Screening:  Hepatitis C Screening: does qualify; Completed 02/04/2015  Vision Screening: Recommended annual ophthalmology exams for early detection of glaucoma and other disorders of the eye. Is the patient up to date with their annual eye  exam?  Yes  Who is the provider or what is the name of the office in which the patient attends annual eye exams? [facility] [place] If pt is not established with a provider, would they like to be referred to a provider to establish care? No .   Dental Screening: Recommended annual dental exams for proper oral hygiene  Diabetic Foot Exam: Diabetic Foot Exam: Completed 05/20/2023  Community Resource Referral / Chronic Care Management: CRR required this visit?  No   CCM required this visit?  No     Plan:     I have personally reviewed and noted the following in the patient's chart:   Medical and social history Use of alcohol, tobacco or illicit drugs  Current medications and supplements including opioid prescriptions. Patient is not currently taking opioid prescriptions. Functional ability and status Nutritional status Physical activity Advanced directives List of other physicians Hospitalizations, surgeries, and ER visits in previous 12 months: Hospitalized in November for  v-tach and December for hyperkalemia, OSA, CHF, DM, HTN  with [facility]  Vitals Screenings to include cognitive, depression, and falls Referrals and appointments  In addition, I have reviewed and discussed with patient certain preventive protocols, quality metrics, and best practice recommendations. A written personalized care plan for preventive services as well as general preventive health recommendations were provided to patient.     Novella Olive, FNP   08/10/2023   After Visit Summary: (MyChart) Due to this being a telephonic visit, the after visit summary with patients personalized plan was offered to patient via MyChart   Follow-up with PCP as scheduled.  Consider completing advanced directive as discussed. [facility] to decrease risk of falls.

## 2023-08-10 NOTE — Patient Instructions (Addendum)
Medication Instructions:  Your physician has recommended you make the following change in your medication:  Take an extra torsemide tonight.  Lab Work: None ordered.  If you have labs (blood work) drawn today and your tests are completely normal, you will receive your results only by: MyChart Message (if you have MyChart) OR A paper copy in the mail If you have any lab test that is abnormal or we need to change your treatment, we will call you to review the results.  Testing/Procedures: None ordered.  Follow-Up: At [facility], you and your health needs are our priority.  As part of our continuing mission to provide you with exceptional heart care, we have created designated Provider Care Teams.  These Care Teams include your primary Cardiologist (physician) and Advanced Practice Providers (APPs -  Physician Assistants and Nurse Practitioners) who all work together to provide you with the care you need, when you need it.   Your next appointment:   4 months  The format for your next appointment:   In Person  Provider:   Lewayne Bunting, MD{or one of the following Advanced Practice Providers on your designated Care Team:   Francis Dowse, [place] Casimiro Needle "Mardelle Matte" [place], [place] Earnest Rosier, NP  Remote monitoring is used to monitor your Pacemaker/ ICD from home. This monitoring reduces the number of office visits required to check your device to one time per year. It allows [place] to keep an eye on the functioning of your device to ensure it is working properly.   Important Information About Sugar

## 2023-08-11 ENCOUNTER — Encounter: Payer: Self-pay | Admitting: Cardiology

## 2023-08-11 ENCOUNTER — Ambulatory Visit (INDEPENDENT_AMBULATORY_CARE_PROVIDER_SITE_OTHER): Payer: 59 | Admitting: Cardiology

## 2023-08-11 VITALS — BP 134/79 | HR 97 | Ht 61.0 in | Wt 202.8 lb

## 2023-08-11 DIAGNOSIS — I428 Other cardiomyopathies: Secondary | ICD-10-CM | POA: Diagnosis not present

## 2023-08-11 DIAGNOSIS — I5022 Chronic systolic (congestive) heart failure: Secondary | ICD-10-CM | POA: Diagnosis not present

## 2023-08-11 DIAGNOSIS — I472 Ventricular tachycardia, unspecified: Secondary | ICD-10-CM | POA: Diagnosis not present

## 2023-08-11 DIAGNOSIS — G4733 Obstructive sleep apnea (adult) (pediatric): Secondary | ICD-10-CM | POA: Diagnosis not present

## 2023-08-11 MED ORDER — CARVEDILOL 3.125 MG PO TABS: 3.1250 mg | ORAL_TABLET | Freq: Two times a day (BID) | ORAL | 3 refills | Status: DC

## 2023-08-11 MED ORDER — AMIODARONE HCL 200 MG PO TABS: 200.0000 mg | ORAL_TABLET | Freq: Every day | ORAL | Status: DC

## 2023-08-11 MED ORDER — TORSEMIDE 20 MG PO TABS: 20.0000 mg | ORAL_TABLET | Freq: Every day | ORAL | 3 refills | Status: DC

## 2023-08-11 NOTE — Patient Instructions (Signed)
Medication Instructions:   START CARVEDILOL 3.125 MG ONE TABLET TWICE DAILY  DECREASE AMIODARONE TO 200 MG ONCE DAILY  INCREASE TORSEMIDE TO 20 MG ONCE DAILY= 2 OF THE 10 MG TABLETS ONCE DAILY  *If you need a refill on your cardiac medications before your next appointment, please call your pharmacy*   Lab Work:  Your physician recommends that you return for lab work in: ONE WEEK  If you have labs (blood work) drawn today and your tests are completely normal, you will receive your results only by: [employer] (if you have MyChart) OR A paper copy in the mail If you have any lab test that is abnormal or we need to change your treatment, we will call you to review the results.   Follow-Up: At [facility], you and your health needs are our priority.  As part of our continuing mission to provide you with exceptional heart care, we have created designated Provider Care Teams.  These Care Teams include your primary Cardiologist (physician) and Advanced Practice Providers (APPs -  Physician Assistants and Nurse Practitioners) who all work together to provide you with the care you need, when you need it.  We recommend signing up for the patient portal called "MyChart".  Sign up information is provided on this After Visit Summary.  MyChart is used to connect with patients for Virtual Visits (Telemedicine).  Patients are able to view lab/test results, encounter notes, upcoming appointments, etc.  Non-urgent messages can be sent to your provider as well.   To learn more about what you can do with MyChart, go to [email or web address].    Your next appointment:   3 month(s)  Provider:   Olga Millers MD

## 2023-08-12 LAB — CUP PACEART INCLINIC DEVICE CHECK
Battery Remaining Longevity: 62 mo
Battery Voltage: 3 V
Brady Statistic AP VP Percent: 28.21 %
Brady Statistic AP VS Percent: 0.16 %
Brady Statistic AS VP Percent: 70.19 %
Brady Statistic AS VS Percent: 1.45 %
Brady Statistic RA Percent Paced: 28.71 %
Brady Statistic RV Percent Paced: 98.39 %
Date Time Interrogation Session: 20241217143500
Implantable Lead Connection Status: 753985
Implantable Lead Connection Status: 753985
Implantable Lead Connection Status: 753985
Implantable Lead Implant Date: 20030324
Implantable Lead Implant Date: 20030324
Implantable Lead Implant Date: 20230313
Implantable Lead Location: 753858
Implantable Lead Location: 753859
Implantable Lead Location: 753860
Implantable Lead Model: 3830
Implantable Lead Model: 4068
Implantable Lead Model: 4193
Implantable Pulse Generator Implant Date: 20230313
Lead Channel Impedance Value: 3306 Ohm
Lead Channel Impedance Value: 3306 Ohm
Lead Channel Impedance Value: 3306 Ohm
Lead Channel Impedance Value: 361 Ohm
Lead Channel Impedance Value: 380 Ohm
Lead Channel Impedance Value: 456 Ohm
Lead Channel Impedance Value: 494 Ohm
Lead Channel Impedance Value: 684 Ohm
Lead Channel Impedance Value: 798 Ohm
Lead Channel Pacing Threshold Amplitude: 0.75 V
Lead Channel Pacing Threshold Amplitude: 0.875 V
Lead Channel Pacing Threshold Amplitude: 1 V
Lead Channel Pacing Threshold Amplitude: 1.75 V
Lead Channel Pacing Threshold Amplitude: 1.875 V
Lead Channel Pacing Threshold Pulse Width: 0.4 ms
Lead Channel Pacing Threshold Pulse Width: 0.4 ms
Lead Channel Pacing Threshold Pulse Width: 0.6 ms
Lead Channel Pacing Threshold Pulse Width: 0.8 ms
Lead Channel Pacing Threshold Pulse Width: 0.8 ms
Lead Channel Sensing Intrinsic Amplitude: 1 mV
Lead Channel Sensing Intrinsic Amplitude: 1.25 mV
Lead Channel Sensing Intrinsic Amplitude: 12.25 mV
Lead Channel Sensing Intrinsic Amplitude: 12.25 mV
Lead Channel Setting Pacing Amplitude: 2.5 V
Lead Channel Setting Pacing Amplitude: 2.5 V
Lead Channel Setting Pacing Amplitude: 3 V
Lead Channel Setting Pacing Pulse Width: 0.4 ms
Lead Channel Setting Pacing Pulse Width: 0.8 ms
Lead Channel Setting Sensing Sensitivity: 2.8 mV
Zone Setting Status: 755011
Zone Setting Status: 755011

## 2023-08-13 ENCOUNTER — Other Ambulatory Visit: Payer: Self-pay

## 2023-08-13 MED ORDER — EPIPEN 2-PAK 0.3 MG/0.3ML IJ SOAJ: 0.3000 mg | INTRAMUSCULAR | 1 refills | Status: DC | PRN

## 2023-08-13 NOTE — Progress Notes (Signed)
Meds ordered this encounter  Medications   EPIPEN 2-PAK 0.3 MG/0.3ML SOAJ injection    Sig: Inject 0.3 mg into the muscle as needed for anaphylaxis.    Dispense:  1 each    Refill:  1

## 2023-08-13 NOTE — Progress Notes (Signed)
   08/13/2023  Patient ID: Tanya Hogan, female   DOB: 01/28/1951, 72 y.o.   MRN: [id number]  S/O Telephone visit to review medication list with patient in response to referral placed by patient's PCP, Dr. Linford Arnold  Medication Management -Patient has had 2 recent hospital visits, and medications have been changed causing he to feel confused and somewhat overwhelmed -Reviewed current medications with patient, and she does have all medications on hand -Bottle for amiodarone states to take twice daily, but she is aware this is now just once daily and has been taking as so -States Linzess dose was recently decreased to daily due to diarrhea, and this has been beneficial.  I have updated the dosing on her medication list. -Patient is currently using Spiriva and Wixela for maintenance inhalers as well as albuterol nebulizer solution as needed and states breathing has improved. -Torsemide recently increased from 10mg  to 20mg , and she does have increased dose on hand -Addressed any medication questions patient had about newly prescribed therapies -Patient states Epipen on hand is about to expire -She is also out of Freestyle Precision Neo test strips that she uses with [name] Reader when needed for sensor failure  A/P  Medication Management -Patient has all current medications on hand and is taking correctly as prescribed.  She endorses understanding of current regimen. -Contacted [facility], and patient has active refills on file for her test strips; so the pharmacy put a refill into processing.  These are going through for $0 on her insurance. -Pending a prescription for Epipen for Dr. Linford Arnold to sign if in agreement.  Lenna Gilford, PharmD, DPLA

## 2023-08-14 ENCOUNTER — Emergency Department (HOSPITAL_BASED_OUTPATIENT_CLINIC_OR_DEPARTMENT_OTHER)
Admission: EM | Admit: 2023-08-14 | Discharge: 2023-08-14 | Disposition: A | Discharge status: Home / Self Care | Payer: 59 | Attending: Emergency Medicine | Admitting: Emergency Medicine

## 2023-08-14 ENCOUNTER — Other Ambulatory Visit: Payer: Self-pay

## 2023-08-14 ENCOUNTER — Encounter (HOSPITAL_BASED_OUTPATIENT_CLINIC_OR_DEPARTMENT_OTHER): Payer: Self-pay | Admitting: Emergency Medicine

## 2023-08-14 ENCOUNTER — Emergency Department (HOSPITAL_BASED_OUTPATIENT_CLINIC_OR_DEPARTMENT_OTHER): Payer: 59

## 2023-08-14 DIAGNOSIS — J45909 Unspecified asthma, uncomplicated: Secondary | ICD-10-CM | POA: Insufficient documentation

## 2023-08-14 DIAGNOSIS — R202 Paresthesia of skin: Secondary | ICD-10-CM | POA: Diagnosis not present

## 2023-08-14 DIAGNOSIS — Z7951 Long term (current) use of inhaled steroids: Secondary | ICD-10-CM | POA: Diagnosis not present

## 2023-08-14 DIAGNOSIS — Z7984 Long term (current) use of oral hypoglycemic drugs: Secondary | ICD-10-CM | POA: Diagnosis not present

## 2023-08-14 DIAGNOSIS — Z79899 Other long term (current) drug therapy: Secondary | ICD-10-CM | POA: Diagnosis not present

## 2023-08-14 DIAGNOSIS — R053 Chronic cough: Secondary | ICD-10-CM | POA: Diagnosis not present

## 2023-08-14 DIAGNOSIS — Z950 Presence of cardiac pacemaker: Secondary | ICD-10-CM | POA: Insufficient documentation

## 2023-08-14 DIAGNOSIS — Z853 Personal history of malignant neoplasm of breast: Secondary | ICD-10-CM | POA: Insufficient documentation

## 2023-08-14 DIAGNOSIS — I110 Hypertensive heart disease with heart failure: Secondary | ICD-10-CM | POA: Insufficient documentation

## 2023-08-14 DIAGNOSIS — R7989 Other specified abnormal findings of blood chemistry: Secondary | ICD-10-CM | POA: Diagnosis not present

## 2023-08-14 DIAGNOSIS — R42 Dizziness and giddiness: Secondary | ICD-10-CM | POA: Diagnosis not present

## 2023-08-14 DIAGNOSIS — E1159 Type 2 diabetes mellitus with other circulatory complications: Secondary | ICD-10-CM | POA: Insufficient documentation

## 2023-08-14 DIAGNOSIS — E119 Type 2 diabetes mellitus without complications: Secondary | ICD-10-CM | POA: Insufficient documentation

## 2023-08-14 DIAGNOSIS — I6782 Cerebral ischemia: Secondary | ICD-10-CM | POA: Insufficient documentation

## 2023-08-14 DIAGNOSIS — I442 Atrioventricular block, complete: Secondary | ICD-10-CM | POA: Diagnosis not present

## 2023-08-14 DIAGNOSIS — R059 Cough, unspecified: Secondary | ICD-10-CM | POA: Insufficient documentation

## 2023-08-14 DIAGNOSIS — I5022 Chronic systolic (congestive) heart failure: Secondary | ICD-10-CM | POA: Diagnosis not present

## 2023-08-14 DIAGNOSIS — R9082 White matter disease, unspecified: Secondary | ICD-10-CM | POA: Diagnosis not present

## 2023-08-14 DIAGNOSIS — G4733 Obstructive sleep apnea (adult) (pediatric): Secondary | ICD-10-CM | POA: Diagnosis not present

## 2023-08-14 DIAGNOSIS — R110 Nausea: Secondary | ICD-10-CM | POA: Insufficient documentation

## 2023-08-14 DIAGNOSIS — Z7982 Long term (current) use of aspirin: Secondary | ICD-10-CM | POA: Diagnosis not present

## 2023-08-14 DIAGNOSIS — R0989 Other specified symptoms and signs involving the circulatory and respiratory systems: Secondary | ICD-10-CM | POA: Insufficient documentation

## 2023-08-14 DIAGNOSIS — R0600 Dyspnea, unspecified: Secondary | ICD-10-CM | POA: Diagnosis not present

## 2023-08-14 DIAGNOSIS — E039 Hypothyroidism, unspecified: Secondary | ICD-10-CM | POA: Insufficient documentation

## 2023-08-14 LAB — CBC WITH DIFFERENTIAL/PLATELET
Abs Immature Granulocytes: 0.01 10*3/uL (ref 0.00–0.07)
Basophils Absolute: 0 10*3/uL (ref 0.0–0.1)
Basophils Relative: 0 %
Eosinophils Absolute: 0.1 10*3/uL (ref 0.0–0.5)
Eosinophils Relative: 2 %
HCT: 38.2 % (ref 36.0–46.0)
Hemoglobin: 12.2 g/dL (ref 12.0–15.0)
Immature Granulocytes: 0 %
Lymphocytes Relative: 24 %
Lymphs Abs: 1.2 10*3/uL (ref 0.7–4.0)
MCH: 31.7 pg (ref 26.0–34.0)
MCHC: 31.9 g/dL (ref 30.0–36.0)
MCV: 99.2 fL (ref 80.0–100.0)
Monocytes Absolute: 0.5 10*3/uL (ref 0.1–1.0)
Monocytes Relative: 9 %
Neutro Abs: 3.4 10*3/uL (ref 1.7–7.7)
Neutrophils Relative %: 65 %
Platelets: 273 10*3/uL (ref 150–400)
RBC: 3.85 MIL/uL — ABNORMAL LOW (ref 3.87–5.11)
RDW: 15.5 % (ref 11.5–15.5)
WBC: 5.2 10*3/uL (ref 4.0–10.5)
nRBC: 0 % (ref 0.0–0.2)

## 2023-08-14 LAB — URINALYSIS, MICROSCOPIC (REFLEX)

## 2023-08-14 LAB — COMPREHENSIVE METABOLIC PANEL
ALT: 56 U/L — ABNORMAL HIGH (ref 0–44)
AST: 65 U/L — ABNORMAL HIGH (ref 15–41)
Albumin: 3.6 g/dL (ref 3.5–5.0)
Alkaline Phosphatase: 131 U/L — ABNORMAL HIGH (ref 38–126)
Anion gap: 9 (ref 5–15)
BUN: 18 mg/dL (ref 8–23)
CO2: 23 mmol/L (ref 22–32)
Calcium: 9.4 mg/dL (ref 8.9–10.3)
Chloride: 106 mmol/L (ref 98–111)
Creatinine, Ser: 1.01 mg/dL — ABNORMAL HIGH (ref 0.44–1.00)
GFR, Estimated: 58 mL/min — ABNORMAL LOW (ref >60)
Glucose, Bld: 122 mg/dL — ABNORMAL HIGH (ref 70–99)
Potassium: 3.8 mmol/L (ref 3.5–5.1)
Sodium: 138 mmol/L (ref 135–145)
Total Bilirubin: 0.6 mg/dL (ref <1.2)
Total Protein: 6.9 g/dL (ref 6.5–8.1)

## 2023-08-14 LAB — CBC
HCT: 40.3 % (ref 36.0–46.0)
Hemoglobin: 12.9 g/dL (ref 12.0–15.0)
MCH: 31.8 pg (ref 26.0–34.0)
MCHC: 32 g/dL (ref 30.0–36.0)
MCV: 99.3 fL (ref 80.0–100.0)
Platelets: 280 10*3/uL (ref 150–400)
RBC: 4.06 MIL/uL (ref 3.87–5.11)
RDW: 15.5 % (ref 11.5–15.5)
WBC: 5.2 10*3/uL (ref 4.0–10.5)
nRBC: 0 % (ref 0.0–0.2)

## 2023-08-14 LAB — TROPONIN I (HIGH SENSITIVITY)
Troponin I (High Sensitivity): 22 ng/L — ABNORMAL HIGH (ref <18)
Troponin I (High Sensitivity): 25 ng/L — ABNORMAL HIGH (ref <18)

## 2023-08-14 LAB — MAGNESIUM: Magnesium: 2 mg/dL (ref 1.7–2.4)

## 2023-08-14 LAB — BASIC METABOLIC PANEL
Anion gap: 9 (ref 5–15)
BUN: 19 mg/dL (ref 8–23)
CO2: 23 mmol/L (ref 22–32)
Calcium: 9.6 mg/dL (ref 8.9–10.3)
Chloride: 106 mmol/L (ref 98–111)
Creatinine, Ser: 1.08 mg/dL — ABNORMAL HIGH (ref 0.44–1.00)
GFR, Estimated: 54 mL/min — ABNORMAL LOW (ref >60)
Glucose, Bld: 144 mg/dL — ABNORMAL HIGH (ref 70–99)
Potassium: 4.1 mmol/L (ref 3.5–5.1)
Sodium: 138 mmol/L (ref 135–145)

## 2023-08-14 LAB — URINALYSIS, ROUTINE W REFLEX MICROSCOPIC
Bilirubin Urine: NEGATIVE
Glucose, UA: NEGATIVE mg/dL
Hgb urine dipstick: NEGATIVE
Ketones, ur: NEGATIVE mg/dL
Nitrite: NEGATIVE
Protein, ur: NEGATIVE mg/dL
Specific Gravity, Urine: 1.025 (ref 1.005–1.030)
pH: 5 (ref 5.0–8.0)

## 2023-08-14 LAB — RESP PANEL BY RT-PCR (RSV, FLU A&B, COVID)  RVPGX2
Influenza A by PCR: NEGATIVE
Influenza B by PCR: NEGATIVE
Resp Syncytial Virus by PCR: NEGATIVE
SARS Coronavirus 2 by RT PCR: NEGATIVE

## 2023-08-14 LAB — CBG MONITORING, ED: Glucose-Capillary: 128 mg/dL — ABNORMAL HIGH (ref 70–99)

## 2023-08-14 MED ORDER — MECLIZINE HCL 25 MG PO TABS
25.0000 mg | ORAL_TABLET | Freq: Once | ORAL | Status: AC
  Administered 2023-08-14: 25 mg via ORAL
  Filled 2023-08-14: qty 1

## 2023-08-14 MED ORDER — FEXOFENADINE HCL 60 MG PO TABS
60.0000 mg | ORAL_TABLET | Freq: Two times a day (BID) | ORAL | 0 refills | Status: DC
Start: 2023-08-14 — End: 2023-12-09

## 2023-08-14 MED ORDER — MECLIZINE HCL 25 MG PO TABS: 25.0000 mg | ORAL_TABLET | Freq: Three times a day (TID) | ORAL | 0 refills | Status: DC | PRN

## 2023-08-14 NOTE — ED Notes (Signed)
128 cbg

## 2023-08-14 NOTE — ED Triage Notes (Signed)
Patient repots dizziness and nausea onset this morning around 11 am. Denies vomiting. Recent admission at [facility] and patient states they changed her meds while she was admitted and she believes the new meds are making her experience these sx.

## 2023-08-14 NOTE — ED Provider Notes (Signed)
[facility] Provider Note   CSN: [id number] Arrival date & time: 08/14/23  1836     History {Add pertinent medical, surgical, social history, OB history to HPI:1} Chief Complaint  Patient presents with   Dizziness    Tanya Hogan is a 72 y.o. female.  HPI      72 year old female with a history of chronic systolic heart failure, complete heart block status post biventricular pacemaker, history of ventricular tachycardia, breast cancer, type 2 diabetes, hypertension, asthma, OSA, recent admission December 1 who presents with concern for dizziness and nausea.  Was admitted in December with mild hyperkalemia to 5.8, headache, chronic CHF and was started on hydralazine and Imdur, asthma and was started on doxycycline for presumed bronchitis She was started on carvedilol 3.125 mg twice daily with cardiology on December 18, her Demadex was increased to 20 mg daily, amiodarone was decreased to 200mg  daily  Nausea, dizziness with standing.  Feels like when standing wants to throw up and has to sit down or will fall over. Not necessarily lightheaded.  Bilateral carpal tunnel used to hurt a lot but now has numbness since September. Spinal stenosis. No change in vision, no trouble talking, new numbness/weakness.  Has not been able to walk since after lunch today because dizziness comes in waves. Insurance sent healthy meals ate one.  PPM tune up Tuesday  When standing up or sitting down will feel it more  214lb Dr. Ladona Ridgel, 202lb today this AM  Lost 20lb since being on the fluid pill at beginning of December. Does not see fluid on ankles now.   Singulair new yesterday was on Comptroller [employer]... 3830 [employer]ure... 3830 [employer]...  3830 SelectSecure MRI SureScan   Past Medical History:  Diagnosis Date   Alkaline phosphatase elevation    Asthma    Breast cancer (HCC) 2011   Carpal tunnel syndrome    bilateral    Chronic HFrEF (heart failure with reduced ejection fraction) (HCC)    Cirrhosis, non-alcoholic (HCC)    Closed fracture of unspecified part of radius (alone)    DDD (degenerative disc disease), lumbar    Depression    pt denies   Diabetes mellitus without complication (HCC)    Enlarged heart    Fibromyalgia    GERD (gastroesophageal reflux disease)    History of kidney stones    HTN (hypertension)    Hypothyroidism    IBS (irritable bowel syndrome)    LBBB (left bundle branch block)    Microcytic anemia 05/14/2017   Mitral regurgitation    NICM (nonischemic cardiomyopathy) (HCC)    Pneumonia    Presence of permanent cardiac pacemaker    Pulmonary hypertension (HCC)    PVD (peripheral vascular disease) (HCC)    2019   Sleep apnea    uses cpap   Tricuspid regurgitation    Ventricular tachyarrhythmia (HCC)     Home Medications Prior to Admission medications   Medication Sig Start Date End Date Taking? Authorizing Provider  albuterol (PROVENTIL) (2.5 MG/3ML) 0.083% nebulizer solution Take 2.5 mg by nebulization every 6 (six) hours as needed for wheezing or shortness of breath.    [provider]  albuterol (VENTOLIN HFA) 108 (90 Base) MCG/ACT inhaler Inhale 1-2 puffs into the lungs every 6 (six) hours as needed for wheezing or shortness of breath. 05/27/21   [name], MD  alendronate (FOSAMAX) 70 MG tablet TAKE 1 TABLET BY MOUTH EVERY 7  DAYS  WITH FULL GLASS OF WATER ON AN EMPTY STOMACH 08/13/23   [name], MD  AMBULATORY NON FORMULARY MEDICATION Medication Name: Tubing and supplies for nebulizer. Fax - [phone] 08/14/19   [name], MD  amiodarone (PACERONE) 200 MG tablet Take 1 tablet (200 mg total) by mouth daily. 08/11/23   Lewayne Bunting, MD  aspirin 81 MG chewable tablet Chew 81 mg by mouth daily.    [provider]  benzonatate (TESSALON) 200 MG capsule Take 1 capsule (200 mg total) by mouth 2 (two) times daily  as needed for cough. Patient not taking: Reported on 08/13/2023 07/21/23   Everrett Coombe, DO  Benzoyl Peroxide (PANOXYL FOAMING WASH) 10 % LIQD Wash groin creases daily with this wash  Ok to sub something similar 04/28/23   [name], MD  bisacodyl 5 MG EC tablet Take 5 mg by mouth once.    [provider]  carvedilol (COREG) 3.125 MG tablet Take 1 tablet (3.125 mg total) by mouth 2 (two) times daily. 08/11/23 11/09/23  Lewayne Bunting, MD  Continuous Glucose Receiver (FREESTYLE LIBRE 3 READER) DEVI Use for continuous glucose monitoring 03/22/23   [name], MD  Continuous Glucose Sensor (FREESTYLE LIBRE 3 SENSOR) MISC by Does not apply route. Change every 14 days    [provider]  EPIPEN 2-PAK 0.3 MG/0.3ML SOAJ injection Inject 0.3 mg into the muscle as needed for anaphylaxis. 08/13/23   [name], MD  fluticasone-salmeterol (WIXELA INHUB) 250-50 MCG/ACT AEPB Inhale 1 puff into the lungs in the morning and at bedtime.    [provider]  glucose blood test strip To be used to check blood glucose Dx:E11.8 FreeStyle Precision blood glucose for Freestyle Libre 2 08/25/22   [name], MD  hydrALAZINE (APRESOLINE) 10 MG tablet Take 10 mg by mouth in the morning and at bedtime. 07/26/23 08/25/23  [provider]  isosorbide mononitrate (IMDUR) 30 MG 24 hr tablet Take 1 tablet by mouth daily. 07/27/23 08/26/23  [provider]  levothyroxine (SYNTHROID) 112 MCG tablet Take 1 tablet (112 mcg total) by mouth daily. 07/20/23   [name], MD  linaclotide (LINZESS) 145 MCG CAPS capsule Take 145 mcg by mouth daily before breakfast.    [provider]  metFORMIN (GLUCOPHAGE-XR) 500 MG 24 hr tablet TAKE 1 TABLET BY MOUTH TWICE  DAILY WITH A MEAL 08/13/23   [name], MD  montelukast (SINGULAIR) 10 MG tablet Take 1 tablet by mouth at bedtime. 08/10/23 08/09/24  [provider]  Multiple  Vitamins-Minerals (OCUVITE EYE + MULTI) TABS Take 1 tablet by mouth in the morning.    [provider]  nystatin cream (MYCOSTATIN) Apply 1 Application topically 2 (two) times daily as needed (rash). Patient not taking: Reported on 08/13/2023 02/16/23   [name], MD  pantoprazole (PROTONIX) 40 MG tablet TAKE 1 TABLET BY MOUTH DAILY  BEFORE BREAKFAST 10/20/22   [name], MD  pravastatin (PRAVACHOL) 40 MG tablet TAKE 1 TABLET BY MOUTH AT  BEDTIME 07/21/23   [name], MD  Tiotropium Bromide Monohydrate (SPIRIVA RESPIMAT) 1.25 MCG/ACT AERS Inhale 2 puffs into the lungs daily.    [provider]  torsemide (DEMADEX) 20 MG tablet Take 1 tablet (20 mg total) by mouth daily. 08/11/23 09/10/23  Lewayne Bunting, MD  valACYclovir (VALTREX) 1000 MG tablet Take 1 tablet (1,000 mg total) by mouth 2 (two) times daily. Patient not taking: Reported on  08/13/2023 05/17/23   Everrett Coombe, DO      Allergies    Injectafer [ferric carboxymaltose], Penicillins, Accupril [quinapril hcl], Aleve [naproxen], Basle, Celebrex [celecoxib], Cozaar [losartan potassium], Flonase [fluticasone], Lactose intolerance (gi), Levomenol, Lipitor [atorvastatin], Nasonex [mometasone furoate], Xyzal [levocetirizine], Bentyl [dicyclomine], Chamomile, Entresto [sacubitril-valsartan], Vesicare [solifenacin], Aspirin, Baking soda-fluoride [sodium fluoride], Clindamycin/lincomycin, Codeine, Furosemide, Lactose, Lanolin-petrolatum, Miralax [polyethylene glycol], Quinine, Reclast [zoledronic acid], Rifampin, Sulfa antibiotics, Sulfonamide derivatives, Tramadol, Wellbutrin [bupropion], and Wool alcohol [lanolin]    Review of Systems   Review of Systems  Physical Exam Updated Vital Signs BP 136/71   Pulse 76   Temp 97.6 F (36.4 C) (Oral)   Resp (!) 24   Ht 5\' 1"  (1.549 m)   Wt 92 kg   SpO2 97%   BMI 38.32 kg/m  Physical Exam  ED Results / Procedures / Treatments   Labs (all labs  ordered are listed, but only abnormal results are displayed) Labs Reviewed - No data to display  EKG None  Radiology No results found.  Procedures Procedures  {Document cardiac monitor, telemetry assessment procedure when appropriate:1}  Medications Ordered in ED Medications - No data to display  ED Course/ Medical Decision Making/ A&P   {   Click here for ABCD2, HEART and other calculatorsREFRESH Note before signing :1}                              Medical Decision Making Amount and/or Complexity of Data Reviewed Labs: ordered. Radiology: ordered.   ***  {Document critical care time when appropriate:1} {Document review of labs and clinical decision tools ie heart score, Chads2Vasc2 etc:1}  {Document your independent review of radiology images, and any outside records:1} {Document your discussion with family members, caretakers, and with consultants:1} {Document social determinants of health affecting pt's care:1} {Document your decision making why or why not admission, treatments were needed:1} Final Clinical Impression(s) / ED Diagnoses Final diagnoses:  None    Rx / DC Orders ED Discharge Orders     None

## 2023-08-16 DIAGNOSIS — I5022 Chronic systolic (congestive) heart failure: Secondary | ICD-10-CM | POA: Diagnosis not present

## 2023-08-17 LAB — BASIC METABOLIC PANEL
BUN/Creatinine Ratio: 18 (ref 12–28)
BUN: 17 mg/dL (ref 8–27)
CO2: 23 mmol/L (ref 20–29)
Calcium: 10.1 mg/dL (ref 8.7–10.3)
Chloride: 103 mmol/L (ref 96–106)
Creatinine, Ser: 0.94 mg/dL (ref 0.57–1.00)
Glucose: 115 mg/dL — ABNORMAL HIGH (ref 70–99)
Potassium: 4.7 mmol/L (ref 3.5–5.2)
Sodium: 142 mmol/L (ref 134–144)
eGFR: 64 mL/min/{1.73_m2} (ref >59)

## 2023-08-19 ENCOUNTER — Encounter: Payer: Self-pay | Admitting: *Deleted

## 2023-08-20 ENCOUNTER — Encounter: Payer: Self-pay | Admitting: *Deleted

## 2023-08-20 ENCOUNTER — Ambulatory Visit: Payer: 59 | Admitting: Family Medicine

## 2023-08-21 ENCOUNTER — Encounter: Payer: Self-pay | Admitting: Cardiology

## 2023-08-23 ENCOUNTER — Ambulatory Visit: Payer: Self-pay | Admitting: Licensed Clinical Social Worker

## 2023-08-23 MED ORDER — HYDRALAZINE HCL 10 MG PO TABS: 10.0000 mg | ORAL_TABLET | Freq: Two times a day (BID) | ORAL | 3 refills | Status: DC

## 2023-08-23 MED ORDER — ISOSORBIDE MONONITRATE ER 30 MG PO TB24: 30.0000 mg | ORAL_TABLET | Freq: Every day | ORAL | 3 refills | Status: DC

## 2023-08-23 NOTE — Patient Instructions (Signed)
Visit Information  Thank you for taking time to visit with me today. Please don't hesitate to contact me if I can be of assistance to you.   Following are the goals we discussed today:   Goals Addressed             This Visit's Progress    Car Coordination Activities       Care Coordination Interventions: Patient stated that she was recently in the hospital in the last week of November and has been home and that the hospital provided her a lighter Grillo that is better for her to use.  Patient stated that her brother is in her home but he has parkinson diease and not to much help to her and that  she could use a HHA to help her take baths and to assist her in the home. The SW will inbox  the PCP. The patient stated that she is getting the U-card for food and the patient does not have any other SDOH issues or concerns SW will follow up 09/06/2023 at 2:45 pm        Our next appointment is by telephone on 09/06/2023 at 2:45 pm  Please call the care guide team at [phone] if you need to cancel or reschedule your appointment.   If you are experiencing a Mental Health or Behavioral Health Crisis or need someone to talk to, please call the Suicide and Crisis Lifeline: 988 go to [facility] [address], [place] [phone]) call 911  Patient verbalizes understanding of instructions and care plan provided today and agrees to view in MyChart. Active MyChart status and patient understanding of how to access instructions and care plan via MyChart confirmed with patient.     Jeanie Cooks, PhD [facility], [facility] Social Worker Direct Dial: [phone]  Fax: [phone]

## 2023-08-23 NOTE — Patient Outreach (Signed)
  Care Coordination   Initial Visit Note   08/23/2023 Name: Tanya Hogan MRN: [id number] DOB: [birth year]  Tanya Hogan is a 72 y.o. year old female who sees [name], Barbarann Ehlers, MD for primary care. I spoke with  [name] by phone today.  What matters to the patients health and wellness today?  HHA    Goals Addressed             This Visit's Progress    Car Coordination Activities       Care Coordination Interventions: Patient stated that she was recently in the hospital in the last week of November and has been home and that the hospital provided her a lighter Eaves that is better for her to use.  Patient stated that her brother is in her home but he has parkinson diease and not to much help to her and that  she could use a HHA to help her take baths and to assist her in the home. The SW will inbox  the PCP. The patient stated that she is getting the U-card for food and the patient does not have any other SDOH issues or concerns SW will follow up 09/06/2023 at 2:45 pm        SDOH assessments and interventions completed:  Yes  SDOH Interventions Today    Flowsheet Row Most Recent Value  SDOH Interventions   Food Insecurity Interventions Intervention Not Indicated  Housing Interventions Intervention Not Indicated  Transportation Interventions Intervention Not Indicated        Care Coordination Interventions:  Yes, provided  Interventions Today    Flowsheet Row Most Recent Value  General Interventions   General Interventions Discussed/Reviewed General Interventions Discussed, Programmer, applications, Communication with  [employer] needed, SW will inbox the PCP to write up an order.]  Communication with PCP/Specialists  [sent an inbox to have order written for HHA]        Follow up plan: Follow up call scheduled for 09/06/2023 at 2:45 pm    Encounter Outcome:  Patient Visit Completed   Jeanie Cooks, PhD [facility]  [facility] Social Worker Direct Dial: [phone]  Fax: [phone]

## 2023-08-24 ENCOUNTER — Encounter: Payer: Self-pay | Admitting: Family Medicine

## 2023-08-24 ENCOUNTER — Ambulatory Visit (INDEPENDENT_AMBULATORY_CARE_PROVIDER_SITE_OTHER): Payer: 59 | Admitting: Family Medicine

## 2023-08-24 VITALS — BP 126/48 | HR 56 | Ht 61.0 in | Wt 214.0 lb

## 2023-08-24 DIAGNOSIS — I10 Essential (primary) hypertension: Secondary | ICD-10-CM | POA: Diagnosis not present

## 2023-08-24 DIAGNOSIS — Z7984 Long term (current) use of oral hypoglycemic drugs: Secondary | ICD-10-CM | POA: Diagnosis not present

## 2023-08-24 DIAGNOSIS — T50905A Adverse effect of unspecified drugs, medicaments and biological substances, initial encounter: Secondary | ICD-10-CM

## 2023-08-24 DIAGNOSIS — E1169 Type 2 diabetes mellitus with other specified complication: Secondary | ICD-10-CM

## 2023-08-24 DIAGNOSIS — J4540 Moderate persistent asthma, uncomplicated: Secondary | ICD-10-CM

## 2023-08-24 DIAGNOSIS — R053 Chronic cough: Secondary | ICD-10-CM

## 2023-08-24 DIAGNOSIS — E785 Hyperlipidemia, unspecified: Secondary | ICD-10-CM

## 2023-08-24 LAB — POCT GLYCOSYLATED HEMOGLOBIN (HGB A1C): Hemoglobin A1C: 5.8 % — AB (ref 4.0–5.6)

## 2023-08-24 NOTE — Assessment & Plan Note (Signed)
See note for asthma.

## 2023-08-24 NOTE — Assessment & Plan Note (Signed)
A1C looks great today at 5.8.  Continue current regimen.  Continue metformin.

## 2023-08-24 NOTE — Assessment & Plan Note (Addendum)
 Blood pressure at goal

## 2023-08-24 NOTE — Assessment & Plan Note (Signed)
 Quite sure what is going on with her in regards to the chronic cough that was quite persistent in the room today.  She is using her Wixela and Spiriva  in the morning and using her albuterol  at bedtime.  She says the albuterol  does help some when she uses it.  We did have her do a peak flow here today and she was in the yellow zone so she did 2 puffs on her regular albuterol  fall that she had in her purse while she was here it did seem to actually settle her cough which is reassuring she already follows with pulmonology.  We discussed maybe getting in with the ENT for the chronic cough since she has already had trials of prednisone  and antibiotics and she is already taking a PPI.  Will also do a test for pertussis.

## 2023-08-24 NOTE — Progress Notes (Signed)
 Established Patient Office Visit  Subjective  Patient ID: Tanya Hogan, female    DOB: [birth year]  Age: 72 y.o. MRN: [id number]  Chief Complaint  Patient presents with   Diabetes    HPI  Hypertension- Pt denies chest pain, SOB, dizziness, or heart palpitations.  Taking meds as directed w/o problems.  Denies medication side effects.    Diabetes - no hypoglycemic events. No wounds or sores that are not healing well. No increased thirst or urination. Checking glucose at home. Taking medications as prescribed without any side effects.  She continues to have a chronic cough that she has had since September.  She says it almost feels like a hiccup but then she coughs.  Occasionally she will get mucus up in the mornings but during the rest of the day is usually dry.  She denies any sinus symptoms or nasal congestion she does have a history of asthma and cyst in the past her asthma is always presented with a cough but she has been using her Advair  and Spiriva  and then Ventolin  at night.  She has had some occasional heartburn symptoms.  In the emergency room she did have a chest x-ray which was normal except for showing a lobar cardiac enlargement.  He does have some Tessalon  Perles she is already been on antibiotics steroids and cough medicine and it does not seem to really help.  She felt like she was dizzy and off-balance so she ended up stopping her Singulair  which was her newest medication and actually cut her fluid pill in half and has felt better since then.  Her chronic constipation she is actually taking 2 of the 75 mcg Linzess  tabs    ROS    Objective:     BP (!) 126/48   Pulse (!) 56   Ht 5' 1 (1.549 m)   Wt 214 lb (97.1 kg)   SpO2 96%   PF 320 L/min Comment: 300-375 green zone  BMI 40.43 kg/m    Physical Exam Vitals and nursing note reviewed.  Constitutional:      Appearance: Normal appearance.  HENT:     Head: Normocephalic and atraumatic.  Eyes:      Conjunctiva/sclera: Conjunctivae normal.  Cardiovascular:     Rate and Rhythm: Normal rate and regular rhythm.  Pulmonary:     Effort: Pulmonary effort is normal.     Breath sounds: Normal breath sounds.  Skin:    General: Skin is warm and dry.  Neurological:     Mental Status: She is alert.  Psychiatric:        Mood and Affect: Mood normal.     Results for orders placed or performed in visit on 08/24/23  POCT HgB A1C  Result Value Ref Range   Hemoglobin A1C 5.8 (A) 4.0 - 5.6 %   HbA1c POC (<> result, manual entry)     HbA1c, POC (prediabetic range)     HbA1c, POC (controlled diabetic range)        The ASCVD Risk score (Arnett DK, et al., 2019) failed to calculate for the following reasons:   Risk score cannot be calculated because patient has a medical history suggesting prior/existing ASCVD    Assessment & Plan:   Problem List Items Addressed This Visit       Cardiovascular and Mediastinum   Essential hypertension - Primary   Blood pressure at goal.        Respiratory   Asthma, moderate persistent   Quite sure  what is going on with her in regards to the chronic cough that was quite persistent in the room today.  She is using her Wixela and Spiriva  in the morning and using her albuterol  at bedtime.  She says the albuterol  does help some when she uses it.  We did have her do a peak flow here today and she was in the yellow zone so she did 2 puffs on her regular albuterol  fall that she had in her purse while she was here it did seem to actually settle her cough which is reassuring she already follows with pulmonology.  We discussed maybe getting in with the ENT for the chronic cough since she has already had trials of prednisone  and antibiotics and she is already taking a PPI.  Will also do a test for pertussis.        Endocrine   Type 2 diabetes mellitus with hyperlipidemia (HCC)   A1C looks great today at 5.8.  Continue current regimen.  Continue metformin .       Relevant Orders   POCT HgB A1C (Completed)     Other   Chronic cough   See note for asthma.      Relevant Orders   Bordetella pertussis PCR   B pertussis IgA Ab   B pertussis IgG/IgM Ab   Ambulatory referral to ENT   Other Visit Diagnoses       Medication side effect, initial encounter           Medication  side effect-Singulair  added to intolerance list.  I spent 45 minutes on the day of the encounter to include pre-visit record review, face-to-face time with the patient and post visit ordering of test.   Return in about 4 months (around 12/22/2023) for Diabetes follow-up, Hypertension.    Dorothyann Byars, MD

## 2023-08-26 NOTE — Progress Notes (Signed)
 Hi Tanya Hogan, your IgA antibody against pertussis is elevated which means you could have had a recent infection.  But were still waiting for the PCR test.

## 2023-08-27 ENCOUNTER — Other Ambulatory Visit: Payer: Self-pay | Admitting: *Deleted

## 2023-08-27 MED ORDER — EPINEPHRINE 0.3 MG/0.3ML IJ SOAJ: 0.3000 mg | INTRAMUSCULAR | 1 refills | Status: DC | PRN

## 2023-08-28 DIAGNOSIS — E118 Type 2 diabetes mellitus with unspecified complications: Secondary | ICD-10-CM | POA: Diagnosis not present

## 2023-08-28 LAB — B PERTUSSIS IGG/IGM AB
B pertussis IgG Ab: 3.6 {index} — ABNORMAL HIGH (ref 0.00–0.94)
B pertussis IgM Ab, Quant: <1 {index} (ref 0.0–0.9)

## 2023-08-28 LAB — B PERTUSSIS IGA AB: B pertussis IgA Ab, Quant: 2.5 {index} — ABNORMAL HIGH (ref 0.0–0.9)

## 2023-08-28 LAB — BORDETELLA PERTUSSIS PCR
B. parapertussis DNA: NEGATIVE
B. pertussis DNA: NEGATIVE

## 2023-08-30 ENCOUNTER — Ambulatory Visit: Payer: Self-pay

## 2023-08-30 ENCOUNTER — Other Ambulatory Visit: Payer: Self-pay | Admitting: Family Medicine

## 2023-08-30 NOTE — Progress Notes (Signed)
 The PCR test came back negative.  This means that you are not actively passing virus but most likely did have a recent infection.  We do know that pertussis has been going around the local community.  You did have a Z-Pak back in November which is typical treatment.

## 2023-08-30 NOTE — Patient Outreach (Signed)
  Care Coordination   Follow Up Visit Note   08/30/2023 Name: Tanya Hogan MRN: [id number] DOB: 12-27-1950  Tanya Hogan is a 73 y.o. year old female who sees Tanya Hogan, Tanya BIRCH, MD for primary care. I spoke with  Tanya Hogan Finder by phone today.  What matters to the patients health and wellness today?  Ms. Tanya Hogan reports some improvement since last telephone assessment. She reports she has received her BiPap machine and is using it as prescribed. She reports this is helping with her sleep. She also states that she communicated with her ENT provider, Dr. Georgina and was instructed to increase Pantoprazole  to 40mg  twice a day. She states this has improved her cough and states she has an appointment with Dr. Georgina on 09/29/2023. She states BP 127/93 this morning, oxygen  sat 96% and weight 208 this morning. BS 137 this am and 113 yesterday and reports most recent A1C 5.8. She states she is getting a little stronger. She reports she likes to go the [employer] in [employer] and play the [employer]. She adds this is helping her keep clearer.  Goals Addressed             This Visit's Progress    Assist with health management       Interventions Today    Flowsheet Row Most Recent Value  Chronic Disease   Chronic disease during today's visit Other, Chronic Obstructive Pulmonary Disease (COPD), Congestive Heart Failure (CHF), Hypertension (HTN)  [07/08/23-07/12/23 NSVT,  Presented [facility] 07/25/23-07/26/23 with c/o Headache and K 5.8]  General Interventions   General Interventions Discussed/Reviewed General Interventions Discussed, Doctor Visits, Communication with  [Evaluation of current treatment plan for health condition and patient's adherence to plan.]  Doctor Visits Discussed/Reviewed PCP, Specialist  PCP/Specialist Visits Compliance with follow-up visit  [reviewed upcoming follow up appointments,  conference call with  Tanya Hogan to obtain name and contact number of patient's [facility] navigator and assit with getting moms meals.]  Education Interventions   Education Provided Provided Education  Provided Verbal Education On Nutrition, Medication, When to see the doctor, Insurance Plans  [advised to take medications as prescribed, attend provider visits as scheduled, contact provider with health questions/concerns as needed]  Nutrition Interventions   Nutrition Discussed/Reviewed Nutrition Discussed  Pharmacy Interventions   Pharmacy Dicussed/Reviewed Pharmacy Topics Discussed, Medications and their functions, Referral to Pharmacist  [medication review with patient. provided information Tanya Hogan is a generic version of Advair . RNCM encouraged patient to discuss medication questions with providers(scheduled to see next week)]  Safety Interventions   Safety Discussed/Reviewed Safety Discussed  [encouraged patient to discuss energy level with providers next week. discuss if physical therapy is recommended.]    SDOH assessments and interventions completed:  No  Care Coordination Interventions:  Yes, provided   Follow up plan: Follow up call scheduled for 09/30/23    Encounter Outcome:  Patient Visit Completed   [name], RN, MSN, BSN, CCM Care Management Coordinator [phone]

## 2023-08-30 NOTE — Patient Instructions (Signed)
 Visit Information  Thank you for taking time to visit with me today. Please don't hesitate to contact me if I can be of assistance to you.   Following are the goals we discussed today:  Continue to take medications as prescribed. Continue to attend provider visits as scheduled Continue to eat healthy, lean meats, vegetables, fruits, avoid saturated and transfats Contact provider with health questions or concerns as needed Continue to check blood sugar as recommended and notify provider if questions or concerns Continue to check blood pressure routinely and contact provider if questions or concerns  Our next appointment is by telephone on 09/30/23 at 1:15 pm  Please call the care guide team at [phone] if you need to cancel or reschedule your appointment.   If you are experiencing a Mental Health or Behavioral Health Crisis or need someone to talk to, please call the Suicide and Crisis Lifeline: 988 call the [place]  National Suicide Prevention Lifeline: (743)440-2592 or TTY: (631)034-7848 TTY 309-027-0605) to talk to a trained counselor  [name], RN, MSN, BSN, CCM Care Management Coordinator [phone]

## 2023-08-31 ENCOUNTER — Telehealth: Payer: Self-pay

## 2023-08-31 NOTE — Telephone Encounter (Signed)
 Copied from CRM [id number]. Topic: General - Other >> Aug 30, 2023  9:51 AM Joesph PARAS wrote: Reason for CRM: Patient calling in to state that the medication she is on (pantoprazole  (PROTONIX ) 40 MG tablet). Dr. Georgina (ENT) has prescribed her to take TWO 40mg  Tablets daily instead of one. Patient wished to notify PCP.

## 2023-08-31 NOTE — Telephone Encounter (Signed)
 This is just an [place]. She doesn't need a refill.   The chart was already updated.

## 2023-09-06 ENCOUNTER — Ambulatory Visit: Payer: Self-pay | Admitting: Licensed Clinical Social Worker

## 2023-09-06 NOTE — Patient Instructions (Signed)
 Visit Information  Thank you for taking time to visit with me today. Please don't hesitate to contact me if I can be of assistance to you.   Following are the goals we discussed today:   Goals Addressed   None     Our next appointment is by telephone on 09/20/2023 at 1:00 pm  Please call the care guide team at [phone] if you need to cancel or reschedule your appointment.   If you are experiencing a Mental Health or Behavioral Health Crisis or need someone to talk to, please call the Suicide and Crisis Lifeline: 988 go to [facility] Urgent [facility] [address], [place] [phone]) call 911  Patient verbalizes understanding of instructions and care plan provided today and agrees to view in MyChart. Active MyChart status and patient understanding of how to access instructions and care plan via MyChart confirmed with patient.     Tobias CHARM Maranda HEDWIG, PhD [facility], [facility] Social Worker Direct Dial : [phone]  Fax: [phone]

## 2023-09-06 NOTE — Patient Outreach (Signed)
  Care Coordination   Follow Up Visit Note   09/06/2023 Name: Tanya Hogan MRN: [id number] DOB: 03-27-1951  Tanya Hogan is a 73 y.o. year old female who sees [name], Dorothyann BIRCH, MD for primary care. I spoke with  Tanya Hogan by phone today.  What matters to the patients health and wellness today?  HHA order    Goals Addressed   None     SDOH assessments and interventions completed:  Yes  SDOH Interventions Today    Flowsheet Row Most Recent Value  SDOH Interventions   Food Insecurity Interventions Intervention Not Indicated  Housing Interventions Intervention Not Indicated  Transportation Interventions Intervention Not Indicated  Utilities Interventions Intervention Not Indicated        Care Coordination Interventions:  Yes, provided  Interventions Today    Flowsheet Row Most Recent Value  General Interventions   General Interventions Discussed/Reviewed General Interventions Discussed, Community Resources  [patient is still in need of a HHA]        Follow up plan: Follow up call scheduled for 09/20/2023 at 1:00 pm    Encounter Outcome:  Patient Visit Completed   Tanya Hogan Tanya HEDWIG, PhD [facility], [facility] Social Worker Direct Dial : [phone]  Fax: [phone]

## 2023-09-07 ENCOUNTER — Ambulatory Visit: Payer: Self-pay | Admitting: Family Medicine

## 2023-09-07 ENCOUNTER — Ambulatory Visit: Payer: 59 | Admitting: Family Medicine

## 2023-09-07 ENCOUNTER — Telehealth: Payer: Self-pay | Admitting: Family Medicine

## 2023-09-07 DIAGNOSIS — E118 Type 2 diabetes mellitus with unspecified complications: Secondary | ICD-10-CM

## 2023-09-07 DIAGNOSIS — I428 Other cardiomyopathies: Secondary | ICD-10-CM

## 2023-09-07 DIAGNOSIS — I5022 Chronic systolic (congestive) heart failure: Secondary | ICD-10-CM

## 2023-09-07 NOTE — Telephone Encounter (Signed)
Orders Placed This Encounter  Procedures   Ambulatory referral to Home Health    Referral Priority:   Routine    Referral Type:   Home Health Care    Referral Reason:   Specialty Services Required    Requested Specialty:   Home Health Services    Number of Visits Requested:   1    

## 2023-09-07 NOTE — Telephone Encounter (Signed)
-----   Message from Tobias JONETTA Moose sent at 09/06/2023  5:16 PM EST ----- Yes for a Home Health Aid and Thank you, once the order is written will the nurse send it out to a couple of agencies? ----- Message ----- From: Alvan Dorothyann JONETTA, MD Sent: 09/06/2023   4:38 PM EST To: Tobias JONETTA Moose Erskin Tobias. Just want to be sure, is HHA, home health referral?  I am happy to do so if she would like.  We have referred before for her but I think has been awhile. ----- Message ----- From: Moose Tobias D Sent: 09/06/2023   3:31 PM EST To: Dorothyann JONETTA Alvan, MD  Good Afternoon Dr. Alvan,  The patient listed above was referred to me at [facility], she is requesting a HHA and I told her that I needed to reach out to you to see if an order has ever been written or if one can be done, and she also wanted a nurse to call her after it is written (if done) to let her know the next steps.   Tobias CHARM Moose HEDWIG, PhD [facility], [facility] Social Worker Direct Dial : [phone]  Fax:[phone]

## 2023-09-07 NOTE — Telephone Encounter (Signed)
     Chief Complaint: shoulder pain Symptoms: left shoulder pain, unable to raise arm above head Frequency: since beginning of December, comes and goes Pertinent Negatives: Patient denies swelling, numbness, weakness Disposition: [] ED /[] Urgent Care (no appt availability in office) / [x] Appointment(In office/virtual)/ []  [facility]/ [] Home Care/ [] Refused Recommended Disposition /[] [facility]/ []  Follow-up with PCP Additional Notes: Patient reports that she was in the ER at the beginning of December and was lifted up in her bed, causing pain in her left shoulder.  Patient reports this pain comes and goes when she moves it and she is unable to lift her left arm above her head. Per protocol, this RN scheduled in office visit 11/15. Patient advised to call back with worsening symptoms. Patient verbalized understanding.   Copied from CRM [id number]. Topic: Clinical - Red Word Triage >> Sep 07, 2023  2:55 PM Deleta HERO wrote: Red Word that prompted transfer to Nurse Triage: This patient is experiencing extreme pain and stiffness in her left shoulder. She states this may have been caused by her previous hospital visit when she was pulled or lifted. She is unable to raise her left arm, she has to use her right hand to lift her left arm. Reason for Disposition  [1] MODERATE pain (e.g., interferes with normal activities) AND [2] present > 3 days  Answer Assessment - Initial Assessment Questions 1. ONSET: When did the pain start?     Beginning of december 2. LOCATION: Where is the pain located?     Left shoulder 3. PAIN: How bad is the pain? (Scale 1-10; or mild, moderate, severe)   - MILD (1-3): doesn't interfere with normal activities   - MODERATE (4-7): interferes with normal activities (e.g., work or school) or awakens from sleep   - SEVERE (8-10): excruciating pain, unable to do any normal activities, unable to move arm at all due to pain     moderate 4. WORK OR  EXERCISE: Has there been any recent work or exercise that involved this part of the body?     Exercise today 5. CAUSE: What do you think is causing the shoulder pain?     Maybe from my last hospital visit being pulled up 6. OTHER SYMPTOMS: Do you have any other symptoms? (e.g., neck pain, swelling, rash, fever, numbness, weakness)     Unable to raise arm above head  Protocols used: Shoulder Pain-A-AH

## 2023-09-08 ENCOUNTER — Other Ambulatory Visit (INDEPENDENT_AMBULATORY_CARE_PROVIDER_SITE_OTHER): Payer: 59

## 2023-09-08 ENCOUNTER — Ambulatory Visit: Payer: 59

## 2023-09-08 ENCOUNTER — Ambulatory Visit (INDEPENDENT_AMBULATORY_CARE_PROVIDER_SITE_OTHER): Payer: 59 | Admitting: Sports Medicine

## 2023-09-08 ENCOUNTER — Encounter: Payer: Self-pay | Admitting: Family Medicine

## 2023-09-08 VITALS — BP 114/50 | HR 80 | Ht 61.0 in | Wt 212.9 lb

## 2023-09-08 DIAGNOSIS — M85862 Other specified disorders of bone density and structure, left lower leg: Secondary | ICD-10-CM | POA: Diagnosis not present

## 2023-09-08 DIAGNOSIS — M19012 Primary osteoarthritis, left shoulder: Secondary | ICD-10-CM | POA: Insufficient documentation

## 2023-09-08 DIAGNOSIS — M25512 Pain in left shoulder: Secondary | ICD-10-CM

## 2023-09-08 DIAGNOSIS — M25562 Pain in left knee: Secondary | ICD-10-CM | POA: Diagnosis not present

## 2023-09-08 DIAGNOSIS — M170 Bilateral primary osteoarthritis of knee: Secondary | ICD-10-CM

## 2023-09-08 DIAGNOSIS — M25561 Pain in right knee: Secondary | ICD-10-CM | POA: Diagnosis not present

## 2023-09-08 DIAGNOSIS — M85812 Other specified disorders of bone density and structure, left shoulder: Secondary | ICD-10-CM | POA: Diagnosis not present

## 2023-09-08 NOTE — Assessment & Plan Note (Signed)
 Known osteoarthritis, bilateral knee pain, she will do some home health PT, x-rays, return to see me in 6 weeks, bilateral steroid injections if not better. Visco would be indicated if steroid injections fail.

## 2023-09-08 NOTE — Progress Notes (Signed)
   Acute Office Visit  Subjective:     Patient ID: Tanya Hogan, female    DOB: [birth year], 73 y.o.   MRN: [id number]  Chief Complaint  Patient presents with   Shoulder Pain    Left side    HPI Patient is in today for left shoulder pain that has been persistent for a little over a month. She says she remembers it starting up about the same time that she was admitted to the hospital which upon chart review was Dec 1st. She remembers being moved by her shoulders in the hospital as well.   Pt does have a hx of R frozen shoulder.   Review of Systems  Constitutional:  Negative for chills and fever.  Respiratory:  Negative for cough and shortness of breath.   Cardiovascular:  Negative for chest pain.  Musculoskeletal:        L shoulder pain  Neurological:  Negative for headaches.        Objective:    BP (!) 114/50 (BP Location: Left Arm, Patient Position: Sitting, Cuff Size: Large)   Pulse 80   Ht 5\' 1"  (1.549 m)   Wt 212 lb 14 oz (96.6 kg)   SpO2 100%   BMI 40.22 kg/m    Physical Exam Vitals and nursing note reviewed.  Constitutional:      General: She is not in acute distress.    Appearance: Normal appearance.  HENT:     Head: Normocephalic and atraumatic.     Right Ear: External ear normal.     Left Ear: External ear normal.     Nose: Nose normal.  Eyes:     Conjunctiva/sclera: Conjunctivae normal.  Cardiovascular:     Rate and Rhythm: Normal rate and regular rhythm.  Pulmonary:     Effort: Pulmonary effort is normal.     Breath sounds: Normal breath sounds.  Musculoskeletal:     Comments: Limited AROM of left shoulder can't move past 45deg On active range of motion can get left shoulder past 50deg   Neurological:     General: No focal deficit present.     Mental Status: She is alert and oriented to person, place, and time.  Psychiatric:        Mood and Affect: Mood normal.        Behavior: Behavior normal.        Thought Content: Thought content  normal.        Judgment: Judgment normal.     No results found for any visits on 09/08/23.      Assessment & Plan:   Problem List Items Addressed This Visit       Other   Acute pain of left shoulder - Primary    No orders of the defined types were placed in this encounter.   No follow-ups on file.  Josepha Nickels, DO

## 2023-09-08 NOTE — Progress Notes (Signed)
    Procedures performed today:    Procedure: Real-time Ultrasound Guided injection of the left glenohumeral joint Device: Samsung HS60  Verbal informed consent obtained.  Time-out conducted.  Noted no overlying erythema, induration, or other signs of local infection.  Skin prepped in a sterile fashion.  Local anesthesia: Topical Ethyl chloride.  With sterile technique and under real time ultrasound guidance: Arthritic changes noted, 1 cc Kenalog  40, 2 cc lidocaine , 2 cc bupivacaine  injected easily Completed without difficulty  Advised to call if fevers/chills, erythema, induration, drainage, or persistent bleeding.  Images permanently stored and available for review in PACS.  Impression: Technically successful ultrasound guided injection.  Independent interpretation of notes and tests performed by another provider:   None.  Brief History, Exam, Impression, and Recommendations:    Acute pain of left shoulder Pleasant 73 year old female, subacute pain left shoulder present for about 2 months. History of right shoulder arthroplasty, pain with external rotation but overall relatively good motion, this is more suggestive of osteoarthritis than frozen shoulder. Today we did a glenohumeral joint injection, we will get x-rays, home health PT, return to see me in about 6 weeks.  Primary osteoarthritis of both knees Known osteoarthritis, bilateral knee pain, she will do some home health PT, x-rays, return to see me in 6 weeks, bilateral steroid injections if not better. Visco would be indicated if steroid injections fail.   ____________________________________________ Joselyn Nicely. Sandy Crumb, M.D., ABFM., CAQSM., AME. Primary Care and Sports Medicine [facility]  Adjunct Professor of [facility] Medicine  [employer]  Restaurant manager, fast food

## 2023-09-08 NOTE — Assessment & Plan Note (Addendum)
 Pleasant 73 year old female, subacute pain left shoulder present for about 2 months. History of right shoulder arthroplasty, pain with external rotation but overall relatively good motion, this is more suggestive of osteoarthritis than frozen shoulder. Today we did a glenohumeral joint injection, we will get x-rays, home health PT, return to see me in about 6 weeks.

## 2023-09-09 DIAGNOSIS — M170 Bilateral primary osteoarthritis of knee: Secondary | ICD-10-CM | POA: Diagnosis not present

## 2023-09-09 DIAGNOSIS — M25512 Pain in left shoulder: Secondary | ICD-10-CM | POA: Diagnosis not present

## 2023-09-09 MED ORDER — TRIAMCINOLONE ACETONIDE 40 MG/ML IJ SUSP
40.0000 mg | Freq: Once | INTRAMUSCULAR | Status: AC
  Administered 2023-09-09: 40 mg via INTRAMUSCULAR

## 2023-09-09 NOTE — Addendum Note (Signed)
Addended by: Carren Rang A on: 09/09/2023 09:49 AM   Modules accepted: Orders

## 2023-09-09 NOTE — Code Documentation (Signed)
done

## 2023-09-10 DIAGNOSIS — M542 Cervicalgia: Secondary | ICD-10-CM | POA: Diagnosis not present

## 2023-09-10 DIAGNOSIS — M25511 Pain in right shoulder: Secondary | ICD-10-CM | POA: Diagnosis not present

## 2023-09-13 NOTE — Progress Notes (Signed)
Remote pacemaker transmission.   

## 2023-09-13 NOTE — Addendum Note (Signed)
Addended by: Elease Etienne A on: 09/13/2023 10:21 AM   Modules accepted: Orders

## 2023-09-14 ENCOUNTER — Encounter: Payer: Self-pay | Admitting: Sports Medicine

## 2023-09-14 DIAGNOSIS — E118 Type 2 diabetes mellitus with unspecified complications: Secondary | ICD-10-CM | POA: Diagnosis not present

## 2023-09-15 DIAGNOSIS — Z860100 Personal history of colon polyps, unspecified: Secondary | ICD-10-CM | POA: Diagnosis not present

## 2023-09-15 DIAGNOSIS — Z886 Allergy status to analgesic agent status: Secondary | ICD-10-CM | POA: Diagnosis not present

## 2023-09-15 DIAGNOSIS — Z860101 Personal history of adenomatous and serrated colon polyps: Secondary | ICD-10-CM | POA: Diagnosis not present

## 2023-09-15 DIAGNOSIS — Z1211 Encounter for screening for malignant neoplasm of colon: Secondary | ICD-10-CM | POA: Diagnosis not present

## 2023-09-15 DIAGNOSIS — J45909 Unspecified asthma, uncomplicated: Secondary | ICD-10-CM | POA: Diagnosis not present

## 2023-09-15 DIAGNOSIS — J449 Chronic obstructive pulmonary disease, unspecified: Secondary | ICD-10-CM | POA: Diagnosis not present

## 2023-09-15 DIAGNOSIS — Z950 Presence of cardiac pacemaker: Secondary | ICD-10-CM | POA: Diagnosis not present

## 2023-09-15 DIAGNOSIS — E039 Hypothyroidism, unspecified: Secondary | ICD-10-CM | POA: Diagnosis not present

## 2023-09-15 DIAGNOSIS — I471 Supraventricular tachycardia, unspecified: Secondary | ICD-10-CM | POA: Diagnosis not present

## 2023-09-15 DIAGNOSIS — Z882 Allergy status to sulfonamides status: Secondary | ICD-10-CM | POA: Diagnosis not present

## 2023-09-15 DIAGNOSIS — E119 Type 2 diabetes mellitus without complications: Secondary | ICD-10-CM | POA: Diagnosis not present

## 2023-09-15 DIAGNOSIS — I5023 Acute on chronic systolic (congestive) heart failure: Secondary | ICD-10-CM | POA: Diagnosis not present

## 2023-09-15 DIAGNOSIS — K7581 Nonalcoholic steatohepatitis (NASH): Secondary | ICD-10-CM | POA: Diagnosis not present

## 2023-09-15 DIAGNOSIS — Z885 Allergy status to narcotic agent status: Secondary | ICD-10-CM | POA: Diagnosis not present

## 2023-09-15 DIAGNOSIS — M797 Fibromyalgia: Secondary | ICD-10-CM | POA: Diagnosis not present

## 2023-09-15 DIAGNOSIS — D120 Benign neoplasm of cecum: Secondary | ICD-10-CM | POA: Diagnosis not present

## 2023-09-15 DIAGNOSIS — Z853 Personal history of malignant neoplasm of breast: Secondary | ICD-10-CM | POA: Diagnosis not present

## 2023-09-15 DIAGNOSIS — M5030 Other cervical disc degeneration, unspecified cervical region: Secondary | ICD-10-CM | POA: Diagnosis not present

## 2023-09-15 DIAGNOSIS — G4730 Sleep apnea, unspecified: Secondary | ICD-10-CM | POA: Diagnosis not present

## 2023-09-15 DIAGNOSIS — Z880 Allergy status to penicillin: Secondary | ICD-10-CM | POA: Diagnosis not present

## 2023-09-15 DIAGNOSIS — I110 Hypertensive heart disease with heart failure: Secondary | ICD-10-CM | POA: Diagnosis not present

## 2023-09-15 DIAGNOSIS — Z538 Procedure and treatment not carried out for other reasons: Secondary | ICD-10-CM | POA: Diagnosis not present

## 2023-09-15 DIAGNOSIS — E785 Hyperlipidemia, unspecified: Secondary | ICD-10-CM | POA: Diagnosis not present

## 2023-09-15 DIAGNOSIS — I4891 Unspecified atrial fibrillation: Secondary | ICD-10-CM | POA: Diagnosis not present

## 2023-09-15 DIAGNOSIS — I252 Old myocardial infarction: Secondary | ICD-10-CM | POA: Diagnosis not present

## 2023-09-15 DIAGNOSIS — J4489 Other specified chronic obstructive pulmonary disease: Secondary | ICD-10-CM | POA: Diagnosis not present

## 2023-09-15 DIAGNOSIS — K635 Polyp of colon: Secondary | ICD-10-CM | POA: Diagnosis not present

## 2023-09-15 DIAGNOSIS — K219 Gastro-esophageal reflux disease without esophagitis: Secondary | ICD-10-CM | POA: Diagnosis not present

## 2023-09-15 DIAGNOSIS — D122 Benign neoplasm of ascending colon: Secondary | ICD-10-CM | POA: Diagnosis not present

## 2023-09-15 DIAGNOSIS — I509 Heart failure, unspecified: Secondary | ICD-10-CM | POA: Diagnosis not present

## 2023-09-20 ENCOUNTER — Ambulatory Visit: Payer: Self-pay | Admitting: Licensed Clinical Social Worker

## 2023-09-20 NOTE — Patient Instructions (Signed)
Visit Information  Thank you for taking time to visit with me today. Please don't hesitate to contact me if I can be of assistance to you.   Following are the goals we discussed today:   Goals Addressed             This Visit's Progress    Car Coordination Activities       Care Coordination Interventions: Patient stated that she was recently in the hospital in the last week of November and has been home and that the hospital provided her a lighter Decarli that is better for her to use.  Patient stated that her brother is in her home but he has parkinson diease and not to much help to her and that  she could use a HHA to help her take baths and to assist her in the home. The SW will inbox  the PCP. Patient still in need of a HHA, Patient is going to call [facility] to see if they will cover one. Sw will contact the PCP to let her know that is willing to take an order for limited time for PT and a HHA if insurance will not cover.  The patient stated that she is getting the U-card for food and the patient does not have any other SDOH issues or concerns SW will follow up 10/08/2023 at 11:0 am        Our next appointment is by telephone on 10/08/2023 at 11:00 am  Please call the care guide team at [phone] if you need to cancel or reschedule your appointment.   If you are experiencing a Mental Health or Behavioral Health Crisis or need someone to talk to, please call the Suicide and Crisis Lifeline: 988 go to [facility] Urgent [facility] [address] [place] [phone]) call 911  Patient verbalizes understanding of instructions and care plan provided today and agrees to view in MyChart. Active MyChart status and patient understanding of how to access instructions and care plan via MyChart confirmed with patient.     Jeanie Cooks, PhD [facility], [facility] Social Worker Direct Dial: [phone]  Fax: [phone]

## 2023-09-20 NOTE — Patient Outreach (Signed)
  Care Coordination   Follow Up Visit Note   09/20/2023 Name: Tanya Hogan MRN: [id number] DOB: 1951-07-19  Tanya Hogan is a 73 y.o. year old female who sees [name], Barbarann Ehlers, MD for primary care. I spoke with  [name] by phone today.  What matters to the patients health and wellness today?  HHA Order and insurance coverage for HHA    Goals Addressed             This Visit's Progress    Car Coordination Activities       Care Coordination Interventions: Patient stated that she was recently in the hospital in the last week of November and has been home and that the hospital provided her a lighter Phillis that is better for her to use.  Patient stated that her brother is in her home but he has parkinson diease and not to much help to her and that  she could use a HHA to help her take baths and to assist her in the home. The SW will inbox  the PCP. Patient still in need of a HHA, Patient is going to call [facility] to see if they will cover one. Sw will contact the PCP to let her know that is willing to take an order for limited time for PT and a HHA if insurance will not cover.  The patient stated that she is getting the U-card for food and the patient does not have any other SDOH issues or concerns SW will follow up 10/08/2023 at 11:0 am        SDOH assessments and interventions completed:  Yes  SDOH Interventions Today    Flowsheet Row Most Recent Value  SDOH Interventions   Food Insecurity Interventions Intervention Not Indicated  Housing Interventions Intervention Not Indicated  Transportation Interventions Intervention Not Indicated  Utilities Interventions Intervention Not Indicated        Care Coordination Interventions:  Yes, provided  Interventions Today    Flowsheet Row Most Recent Value  General Interventions   General Interventions Discussed/Reviewed General Interventions Reviewed, [employer], Communication with  Communication with  PCP/Specialists  [. Sw will contact the PCP to let her know that is willing to take an order for limited time for PT and a HHA if insurance will not cover.]        Follow up plan: Follow up call scheduled for 10/08/2023 at 11:00 am    Encounter Outcome:  Patient Visit Completed   Jeanie Cooks, PhD [facility], [facility] Social Worker Direct Dial: [phone]  Fax: [phone]

## 2023-09-21 DIAGNOSIS — R197 Diarrhea, unspecified: Secondary | ICD-10-CM | POA: Diagnosis not present

## 2023-09-21 DIAGNOSIS — K5900 Constipation, unspecified: Secondary | ICD-10-CM | POA: Diagnosis not present

## 2023-09-23 ENCOUNTER — Ambulatory Visit (INDEPENDENT_AMBULATORY_CARE_PROVIDER_SITE_OTHER): Payer: 59 | Admitting: Sports Medicine

## 2023-09-23 DIAGNOSIS — M5030 Other cervical disc degeneration, unspecified cervical region: Secondary | ICD-10-CM

## 2023-09-23 DIAGNOSIS — M19012 Primary osteoarthritis, left shoulder: Secondary | ICD-10-CM | POA: Diagnosis not present

## 2023-09-23 NOTE — Progress Notes (Signed)
    Procedures performed today:    None.  Independent interpretation of notes and tests performed by another provider:   None.  Brief History, Exam, Impression, and Recommendations:    DDD (degenerative disc disease), cervical Pleasant 73 year old female, known cervical spondylosis, unable to have MRIs due to pacemaker, we did get a CT cervical spine back in 2021, she had C5-C6 spondylosis, she had a cervical epidural without improvement, she also had uncontrolled depression. At this point we will start conservatively with formal PT, updated x-rays. She would like to avoid medications for now and she desires to absolutely avoid epidurals as she had a bad experience with her last I do think she needs an SNRI of some type, she was tearful in the examination room, she is not on any medications for mood. She has over 30 allergies in her allergy list, indicating that none of these are likely true allergies. We will have a low threshold for starting duloxetine.  Primary osteoarthritis of left shoulder X-rays did show glenohumeral osteoarthritis, we did glenohumeral joint injection earlier this month, no relief per patient report, I do suspect there is certainly a myofascial component here. We will add some PT and if insufficient improvement we will lean on duloxetine.    ____________________________________________ Ihor Austin. Benjamin Stain, M.D., ABFM., CAQSM., AME. Primary Care and Sports Medicine [facility]  Adjunct Professor of Family Medicine  [place] [employer] [facility]  Restaurant manager, fast food

## 2023-09-23 NOTE — Assessment & Plan Note (Signed)
X-rays did show glenohumeral osteoarthritis, we did glenohumeral joint injection earlier this month, no relief per patient report, I do suspect there is certainly a myofascial component here. We will add some PT and if insufficient improvement we will lean on duloxetine.

## 2023-09-23 NOTE — Assessment & Plan Note (Signed)
Pleasant 73 year old female, known cervical spondylosis, unable to have MRIs due to pacemaker, we did get a CT cervical spine back in 2021, she had C5-C6 spondylosis, she had a cervical epidural without improvement, she also had uncontrolled depression. At this point we will start conservatively with formal PT, updated x-rays. She would like to avoid medications for now and she desires to absolutely avoid epidurals as she had a bad experience with her last I do think she needs an SNRI of some type, she was tearful in the examination room, she is not on any medications for mood. She has over 30 allergies in her allergy list, indicating that none of these are likely true allergies. We will have a low threshold for starting duloxetine.

## 2023-09-24 ENCOUNTER — Ambulatory Visit: Payer: 59

## 2023-09-24 DIAGNOSIS — M79601 Pain in right arm: Secondary | ICD-10-CM | POA: Diagnosis not present

## 2023-09-24 DIAGNOSIS — M79602 Pain in left arm: Secondary | ICD-10-CM | POA: Diagnosis not present

## 2023-09-24 DIAGNOSIS — M542 Cervicalgia: Secondary | ICD-10-CM

## 2023-09-24 DIAGNOSIS — M5030 Other cervical disc degeneration, unspecified cervical region: Secondary | ICD-10-CM

## 2023-09-25 DIAGNOSIS — G4733 Obstructive sleep apnea (adult) (pediatric): Secondary | ICD-10-CM | POA: Diagnosis not present

## 2023-09-25 DIAGNOSIS — G4737 Central sleep apnea in conditions classified elsewhere: Secondary | ICD-10-CM | POA: Diagnosis not present

## 2023-09-27 DIAGNOSIS — E118 Type 2 diabetes mellitus with unspecified complications: Secondary | ICD-10-CM | POA: Diagnosis not present

## 2023-09-28 ENCOUNTER — Other Ambulatory Visit: Payer: Self-pay

## 2023-09-28 ENCOUNTER — Ambulatory Visit: Payer: 59 | Attending: Sports Medicine | Admitting: Physical Therapy

## 2023-09-28 ENCOUNTER — Encounter: Payer: Self-pay | Admitting: Physical Therapy

## 2023-09-28 DIAGNOSIS — G4733 Obstructive sleep apnea (adult) (pediatric): Secondary | ICD-10-CM | POA: Diagnosis not present

## 2023-09-28 DIAGNOSIS — M47892 Other spondylosis, cervical region: Secondary | ICD-10-CM | POA: Insufficient documentation

## 2023-09-28 DIAGNOSIS — M62838 Other muscle spasm: Secondary | ICD-10-CM | POA: Diagnosis not present

## 2023-09-28 DIAGNOSIS — M542 Cervicalgia: Secondary | ICD-10-CM

## 2023-09-28 DIAGNOSIS — M5030 Other cervical disc degeneration, unspecified cervical region: Secondary | ICD-10-CM | POA: Insufficient documentation

## 2023-09-28 DIAGNOSIS — Z950 Presence of cardiac pacemaker: Secondary | ICD-10-CM | POA: Diagnosis not present

## 2023-09-28 DIAGNOSIS — R29898 Other symptoms and signs involving the musculoskeletal system: Secondary | ICD-10-CM

## 2023-09-28 NOTE — Therapy (Signed)
 OUTPATIENT PHYSICAL THERAPY CERVICAL EVALUATION   Patient Name: Tanya Hogan MRN: [id number] DOB:1951/07/05, 73 y.o., female Today's Date: 09/28/2023  END OF SESSION:  PT End of Session - 09/28/23 1258     Visit Number 1    Number of Visits 16    Date for PT Re-Evaluation 11/23/23    Authorization Type [employer] Medicare    Progress Note Due on Visit 10    PT Start Time 0945   pt arrived late   PT Stop Time 1015    PT Time Calculation (min) 30 min    Activity Tolerance Patient tolerated treatment well    Behavior During Therapy [facility] for tasks assessed/performed             Past Medical History:  Diagnosis Date   Alkaline phosphatase elevation    Asthma    Breast cancer (HCC) 2011   Carpal tunnel syndrome    bilateral   Chronic HFrEF (heart failure with reduced ejection fraction) (HCC)    Cirrhosis, non-alcoholic (HCC)    Closed fracture of unspecified part of radius (alone)    DDD (degenerative disc disease), lumbar    Depression    pt denies   Diabetes mellitus without complication (HCC)    Enlarged heart    Fibromyalgia    GERD (gastroesophageal reflux disease)    History of kidney stones    HTN (hypertension)    Hypothyroidism    IBS (irritable bowel syndrome)    LBBB (left bundle branch block)    Microcytic anemia 05/14/2017   Mitral regurgitation    NICM (nonischemic cardiomyopathy) (HCC)    Pneumonia    Presence of permanent cardiac pacemaker    Pulmonary hypertension (HCC)    PVD (peripheral vascular disease) (HCC)    2019   Sleep apnea    uses cpap   Tricuspid regurgitation    Ventricular tachyarrhythmia [facility])    Past Surgical History:  Procedure Laterality Date   BIV PACEMAKER GENERATOR CHANGEOUT N/A 11/03/2021   Procedure: BIV PACEMAKER GENERATOR CHANGEOUT;  Surgeon: Waddell Danelle ORN, MD;  Location: [facility] INVASIVE CV LAB;  Service: Cardiovascular;  Laterality: N/A;   BREAST LUMPECTOMY Left 2011   COLONOSCOPY     FOOT SURGERY     KNEE SURGERY      LEAD REVISION/REPAIR N/A 11/03/2021   Procedure: LEAD REVISION/REPAIR;  Surgeon: Waddell Danelle ORN, MD;  Location: [facility] INVASIVE CV LAB;  Service: Cardiovascular;  Laterality: N/A;   MASTECTOMY Right    05/2019   PACEMAKER GENERATOR CHANGE N/A 08/23/2014   Procedure: PACEMAKER GENERATOR CHANGE;  Surgeon: Danelle ORN Waddell, MD;  Location: [facility] CATH LAB;  Service: Cardiovascular;  Laterality: N/A;   PACEMAKER PLACEMENT  2009   [place] [facility]   RECTAL SURGERY  1976   REDUCTION MAMMAPLASTY Left    05/2019   TOTAL SHOULDER ARTHROPLASTY Right 03/10/2018   TOTAL SHOULDER ARTHROPLASTY Right 03/10/2018   Procedure: RIGHT TOTAL SHOULDER ARTHROPLASTY;  Surgeon: Dozier Soulier, MD;  Location: [facility] OR;  Service: Orthopedics;  Laterality: Right;   TUBAL LIGATION     Patient Active Problem List   Diagnosis Date Noted   Primary osteoarthritis of left shoulder 09/08/2023   Injection site reaction 05/17/2023   Nonischemic cardiomyopathy (HCC) 02/16/2023   Class 3 obesity 10/30/2022   Acute on chronic systolic CHF (congestive heart failure) (HCC) 10/29/2022   Stress and adjustment reaction 04/08/2022   Gastroesophageal reflux disease 02/16/2022   AV block, 3rd degree (HCC) 10/24/2021   COPD (chronic  obstructive pulmonary disease) (HCC) 09/21/2021   Osteoporosis 02/28/2021   Primary osteoarthritis of both knees 01/14/2021   Irritable bowel syndrome with constipation 09/16/2020   Aortic atherosclerosis (HCC) 06/11/2020   Benign lipomatous neoplasm of skin and subcutaneous tissue of left leg 06/11/2020   NSVT (nonsustained ventricular tachycardia) (HCC) 02/14/2020   Chronic constipation 10/06/2019   Genetic testing 08/14/2019   Ductal carcinoma in situ (DCIS) of right breast 06/15/2019   Arthralgia 04/11/2019   Malignant tumor of breast (HCC) 03/06/2019   Fatigue 03/06/2019   DDD (degenerative disc disease), cervical 11/19/2017   Peripheral edema 05/19/2017   Microcytic anemia 05/14/2017   Delayed  sleep phase syndrome 10/29/2016   Primary osteoarthritis of left ankle 09/24/2016   Gastrointestinal food sensitivity 05/21/2016   History of arthroplasty of right shoulder 02/24/2016   Bilateral foot pain 01/22/2016   NASH (nonalcoholic steatohepatitis) 05/26/2015    Class: Stage 3   Nasal septal perforation 07/11/2014   Controlled diabetes mellitus type 2 with complications (HCC) 05/25/2014   Physical deconditioning 04/24/2014   Obesity, Class II, BMI 35-39.9, with comorbidity 06/09/2013   OSA (obstructive sleep apnea) 06/08/2013   Chronic systolic heart failure (HCC) 05/23/2013   Asthma, moderate persistent 04/18/2013   Chronic cough 04/18/2013   Breast cancer of upper-inner quadrant of left female breast (HCC) 03/18/2010   Type 2 diabetes mellitus with hyperlipidemia (HCC) 11/07/2009   Fibromyalgia 10/29/2009   Biventricular cardiac pacemaker in situ 10/23/2009   Abnormal levels of other serum enzymes 10/13/2009   Hypothyroidism 10/03/2009   Essential hypertension 07/02/2009   Chronic back pain 07/02/2009    PCP: Alvan   REFERRING PROVIDER: Curtis  REFERRING DIAG: cervical DDD  THERAPY DIAG:  Cervicalgia  Other symptoms and signs involving the musculoskeletal system  Rationale for Evaluation and Treatment: Rehabilitation  ONSET DATE: 09/10/23  SUBJECTIVE:                                                                                                                                                                                                         SUBJECTIVE STATEMENT: Pt states she was sick for a while and then returned to her active seniors group. She was having difficulty lifting her Lt shoulder when she returned and she saw MD. MD gave her an injection in her Lt shoulder which she states caused her to have neck and Rt shoulder pain. The pain was so bad she had to go to ED who gave her pain medicine. She reports the pain has decreased some but her main  complaint is lack  of strength in her shoulders and her neck. She states her neck continues with pain with walking heavy, prolonged standing with looking down. Pt uses a cervical collar that she states she uses very sparingly but it does help Hand dominance: Right  PERTINENT HISTORY:  Rt Total shoulder replacement Pacemaker History of breast cancer with mastectomy Bilateral carpal tunnel  PAIN:  Are you having pain? Yes: NPRS scale: 5-6 currently, 9/10 at worst Pain location: neck to bilat UEs Pain description: sharp Aggravating factors: prolonged standing with looking down, jolting Relieving factors: cervical collar  PRECAUTIONS: ICD/Pacemaker  RED FLAGS: None     WEIGHT BEARING RESTRICTIONS: No  FALLS:  Has patient fallen in last 6 months? Yes. Number of falls multiple - pt states when she has tachycardia she can fall    OCCUPATION: retired  PLOF: Independent  PATIENT GOALS: decrease pain, use her LT UE better  NEXT MD VISIT: 10/2023  OBJECTIVE:  Note: Objective measures were completed at Evaluation unless otherwise noted.  DIAGNOSTIC FINDINGS:  X ray performed but not read on Eval  PATIENT SURVEYS:  NDI 21/50 42%  COGNITION: Overall cognitive status: Within functional limits for tasks assessed  SENSATION: Pt reports she occasionally has numbness in her fingers bilat  POSTURE: rounded shoulders and forward head  PALPATION: Pt TTP throughout cervical musculature, very TTP on cervical spinous processes - unable to tolerate more than light touch with palpation   CERVICAL ROM:   Active ROM A/PROM (deg) eval  Flexion 20  Extension 15  Right lateral flexion 18  Left lateral flexion 11  Right rotation 40  Left rotation 30   (Blank rows = not tested)  UPPER EXTREMITY ROM:  Lt shoulder limited to < 90 degrees flexion and abduction  UPPER EXTREMITY MMT:  MMT Right eval Left eval  Shoulder flexion 3 3-  Shoulder extension    Shoulder abduction  3 3-  Shoulder adduction    Shoulder extension    Shoulder internal rotation    Shoulder external rotation    Middle trapezius    Lower trapezius    Elbow flexion    Elbow extension    Wrist flexion    Wrist extension    Wrist ulnar deviation    Wrist radial deviation    Wrist pronation    Wrist supination    Grip strength     (Blank rows = not tested)    TREATMENT DATE:                                                                                                                              [facility] Adult PT Treatment:                                                DATE: 09/28/23 Therapeutic Exercise: See HEP  Therapeutic Activity: Pt education on rationale for treatment, diagnosis, prognosis, relevant anatomy    PATIENT EDUCATION:  Education details: PT POC and goals, HEP Person educated: Patient Education method: Medical Illustrator Education comprehension: verbalized understanding and returned demonstration  HOME EXERCISE PROGRAM: Access Code: [email or web address] URL: [email or web address] Date: 09/28/2023 Prepared by: Darice Conine  Exercises - Seated Cervical Retraction  - 1 x daily - 7 x weekly - 2 sets - 10 reps - Seated Scapular Retraction  - 1 x daily - 7 x weekly - 3 sets - 10 reps - Gentle Levator Scapulae Stretch  - 1 x daily - 7 x weekly - 1 sets - 10 reps - 10 seconds hold - Seated Upper Trapezius Stretch  - 1 x daily - 7 x weekly - 1 sets - 10 reps - 10 seconds hold  ASSESSMENT:  CLINICAL IMPRESSION: Patient is a 73 y.o. female who was seen today for physical therapy evaluation and treatment for cervical DDD. She presents with decreased strength and ROM, increased muscle spasticity and tenderness to palpation, decreased functional activity tolerance. Pt will benefit from skilled PT to address deficits and improve functional mobility with decreased pain.   OBJECTIVE IMPAIRMENTS: decreased activity tolerance, decreased ROM, decreased  strength, increased muscle spasms, and pain.   ACTIVITY LIMITATIONS: standing  PARTICIPATION LIMITATIONS: meal prep, cleaning, and community activity  PERSONAL FACTORS: Time since onset of injury/illness/exacerbation and 3+ comorbidities: carpal tunnel, total shoulder replacement, arthritis  are also affecting patient's functional outcome.   REHAB POTENTIAL: Good  CLINICAL DECISION MAKING: Stable/uncomplicated  EVALUATION COMPLEXITY: Low   GOALS: Goals reviewed with patient? Yes  SHORT TERM GOALS: Target date: 10/26/2023    Pt will be independent in initial HEP Baseline:  Goal status: INITIAL  2.  Pt will improve cervical rotation ROM by 10 degrees bilat Baseline:  Goal status: INITIAL    LONG TERM GOALS: Target date: 11/23/2023    Pt will be independent with advanced HEP Baseline:  Goal status: INITIAL  2.  Pt will improve NDI to <= 22% to demo improved functional mobility Baseline:  42% Goal status: INITIAL  3.  Pt will report being able to wash dishes x 10 minutes with pain <= 2/10 Baseline:  Goal status: INITIAL  4.  Pt will be able to fully return to senior exercise class with pain < = 2/10 Baseline:  Goal status: INITIAL     PLAN:  PT FREQUENCY: 2x/week  PT DURATION: 8 weeks  PLANNED INTERVENTIONS: 97164- PT Re-evaluation, 97110-Therapeutic exercises, 97530- Therapeutic activity, 97112- Neuromuscular re-education, 97535- Self Care, [id number]- Manual therapy, [id number]- Aquatic Therapy, Patient/Family education, Taping, Dry Needling, Cryotherapy, and Moist heat  PLAN FOR NEXT SESSION: assess response to HEP, cervical mobility, postural strength   Shakeira Rhee, PT 09/28/2023, 1:00 PM

## 2023-09-29 DIAGNOSIS — R053 Chronic cough: Secondary | ICD-10-CM | POA: Diagnosis not present

## 2023-09-30 ENCOUNTER — Ambulatory Visit: Payer: Self-pay

## 2023-09-30 ENCOUNTER — Ambulatory Visit: Payer: 59

## 2023-09-30 DIAGNOSIS — M62838 Other muscle spasm: Secondary | ICD-10-CM | POA: Diagnosis not present

## 2023-09-30 DIAGNOSIS — R29898 Other symptoms and signs involving the musculoskeletal system: Secondary | ICD-10-CM

## 2023-09-30 DIAGNOSIS — M5030 Other cervical disc degeneration, unspecified cervical region: Secondary | ICD-10-CM | POA: Diagnosis not present

## 2023-09-30 DIAGNOSIS — M47892 Other spondylosis, cervical region: Secondary | ICD-10-CM | POA: Diagnosis not present

## 2023-09-30 DIAGNOSIS — M542 Cervicalgia: Secondary | ICD-10-CM

## 2023-09-30 DIAGNOSIS — Z950 Presence of cardiac pacemaker: Secondary | ICD-10-CM | POA: Diagnosis not present

## 2023-09-30 NOTE — Patient Outreach (Signed)
  Care Coordination   Follow Up Visit Note   09/30/2023 Name: Tanya Hogan MRN: [id number] DOB: [birth year]  Tanya Hogan is a 73 y.o. year old female who sees [name], Dorothyann BIRCH, MD for primary care. I spoke with  [name] Finder by phone today.  What matters to the patients health and wellness today?  Ms. Brunner states she has been attending provider visits as scheduled. She states she has been calling her pulmonologist to get a refill on medication Fexofenadine , but has not been able to get a call back from her pulmonologist.   Goals Addressed             This Visit's Progress    Assist with health management       Interventions Today    Flowsheet Row Most Recent Value  General Interventions   General Interventions Discussed/Reviewed General Interventions Reviewed, Doctor Visits  [Evaluation of current treatment plan for health condition and patient's adherence to plan.]  Doctor Visits Discussed/Reviewed Doctor Visits Reviewed, PCP, Specialist  PCP/Specialist Visits Compliance with follow-up visit  [reviewed upcoming appointments]  Communication with PCP/Specialists  [communicated with Dr. Prentice Huguenin with [facility] spoke with [facility] with Patient assist, who reports allegra  was prescribed in ED visit on 08/14/23 and that she would discuss with Dr. Huguenin and contact patient to follow up with her.]  Education Interventions   Education Provided Provided Education  Provided Verbal Education On When to see the doctor, Medication, Other  [advised to take medications as prescribed, attend provider visits as scheduled, advised to contact provider as needed]  Nutrition Interventions   Nutrition Discussed/Reviewed Nutrition Reviewed  Pharmacy Interventions   Pharmacy Dicussed/Reviewed Pharmacy Topics Reviewed  [medications reviewed]            SDOH assessments and interventions completed:  No  Care Coordination Interventions:  Yes, provided   Follow up plan:  Follow up call scheduled for 10/12/23    Encounter Outcome:  Patient Visit Completed   [name], RN, MSN, BSN, CCM [place]  [facility], Population Health Case Manager Phone: [phone]

## 2023-09-30 NOTE — Therapy (Signed)
 OUTPATIENT PHYSICAL THERAPY CERVICAL TREATMENT   Patient Name: Tanya Hogan MRN: [id number] DOB:October 23, 1950, 73 y.o., female Today's Date: 09/30/2023  END OF SESSION:  PT End of Session - 09/30/23 0925     Visit Number 2    Number of Visits 16    Date for PT Re-Evaluation 11/23/23    Authorization Type [employer] Medicare    Progress Note Due on Visit 10    PT Start Time 0930    PT Stop Time 1010    PT Time Calculation (min) 40 min    Activity Tolerance Patient tolerated treatment well    Behavior During Therapy WFL for tasks assessed/performed            Past Medical History:  Diagnosis Date   Alkaline phosphatase elevation    Asthma    Breast cancer (HCC) 2011   Carpal tunnel syndrome    bilateral   Chronic HFrEF (heart failure with reduced ejection fraction) (HCC)    Cirrhosis, non-alcoholic (HCC)    Closed fracture of unspecified part of radius (alone)    DDD (degenerative disc disease), lumbar    Depression    pt denies   Diabetes mellitus without complication (HCC)    Enlarged heart    Fibromyalgia    GERD (gastroesophageal reflux disease)    History of kidney stones    HTN (hypertension)    Hypothyroidism    IBS (irritable bowel syndrome)    LBBB (left bundle branch block)    Microcytic anemia 05/14/2017   Mitral regurgitation    NICM (nonischemic cardiomyopathy) (HCC)    Pneumonia    Presence of permanent cardiac pacemaker    Pulmonary hypertension (HCC)    PVD (peripheral vascular disease) (HCC)    2019   Sleep apnea    uses cpap   Tricuspid regurgitation    Ventricular tachyarrhythmia [facility])    Past Surgical History:  Procedure Laterality Date   BIV PACEMAKER GENERATOR CHANGEOUT N/A 11/03/2021   Procedure: BIV PACEMAKER GENERATOR CHANGEOUT;  Surgeon: Waddell Danelle ORN, MD;  Location: [facility] INVASIVE CV LAB;  Service: Cardiovascular;  Laterality: N/A;   BREAST LUMPECTOMY Left 2011   COLONOSCOPY     FOOT SURGERY     KNEE SURGERY     LEAD REVISION/REPAIR  N/A 11/03/2021   Procedure: LEAD REVISION/REPAIR;  Surgeon: Waddell Danelle ORN, MD;  Location: [facility] INVASIVE CV LAB;  Service: Cardiovascular;  Laterality: N/A;   MASTECTOMY Right    05/2019   PACEMAKER GENERATOR CHANGE N/A 08/23/2014   Procedure: PACEMAKER GENERATOR CHANGE;  Surgeon: Danelle ORN Waddell, MD;  Location: [facility] CATH LAB;  Service: Cardiovascular;  Laterality: N/A;   PACEMAKER PLACEMENT  2009   [facility]   RECTAL SURGERY  1976   REDUCTION MAMMAPLASTY Left    05/2019   TOTAL SHOULDER ARTHROPLASTY Right 03/10/2018   TOTAL SHOULDER ARTHROPLASTY Right 03/10/2018   Procedure: RIGHT TOTAL SHOULDER ARTHROPLASTY;  Surgeon: Dozier Soulier, MD;  Location: [facility] OR;  Service: Orthopedics;  Laterality: Right;   TUBAL LIGATION     Patient Active Problem List   Diagnosis Date Noted   Primary osteoarthritis of left shoulder 09/08/2023   Injection site reaction 05/17/2023   Nonischemic cardiomyopathy (HCC) 02/16/2023   Class 3 obesity 10/30/2022   Acute on chronic systolic CHF (congestive heart failure) (HCC) 10/29/2022   Stress and adjustment reaction 04/08/2022   Gastroesophageal reflux disease 02/16/2022   AV block, 3rd degree (HCC) 10/24/2021   COPD (chronic obstructive pulmonary disease) (HCC) 09/21/2021  Osteoporosis 02/28/2021   Primary osteoarthritis of both knees 01/14/2021   Irritable bowel syndrome with constipation 09/16/2020   Aortic atherosclerosis (HCC) 06/11/2020   Benign lipomatous neoplasm of skin and subcutaneous tissue of left leg 06/11/2020   NSVT (nonsustained ventricular tachycardia) (HCC) 02/14/2020   Chronic constipation 10/06/2019   Genetic testing 08/14/2019   Ductal carcinoma in situ (DCIS) of right breast 06/15/2019   Arthralgia 04/11/2019   Malignant tumor of breast (HCC) 03/06/2019   Fatigue 03/06/2019   DDD (degenerative disc disease), cervical 11/19/2017   Peripheral edema 05/19/2017   Microcytic anemia 05/14/2017   Delayed sleep phase syndrome  10/29/2016   Primary osteoarthritis of left ankle 09/24/2016   Gastrointestinal food sensitivity 05/21/2016   History of arthroplasty of right shoulder 02/24/2016   Bilateral foot pain 01/22/2016   NASH (nonalcoholic steatohepatitis) 05/26/2015    Class: Stage 3   Nasal septal perforation 07/11/2014   Controlled diabetes mellitus type 2 with complications (HCC) 05/25/2014   Physical deconditioning 04/24/2014   Obesity, Class II, BMI 35-39.9, with comorbidity 06/09/2013   OSA (obstructive sleep apnea) 06/08/2013   Chronic systolic heart failure (HCC) 05/23/2013   Asthma, moderate persistent 04/18/2013   Chronic cough 04/18/2013   Breast cancer of upper-inner quadrant of left female breast (HCC) 03/18/2010   Type 2 diabetes mellitus with hyperlipidemia (HCC) 11/07/2009   Fibromyalgia 10/29/2009   Biventricular cardiac pacemaker in situ 10/23/2009   Abnormal levels of other serum enzymes 10/13/2009   Hypothyroidism 10/03/2009   Essential hypertension 07/02/2009   Chronic back pain 07/02/2009    PCP: Alvan   REFERRING PROVIDER: Curtis  REFERRING DIAG: cervical DDD  THERAPY DIAG:  Cervicalgia  Other symptoms and signs involving the musculoskeletal system  Rationale for Evaluation and Treatment: Rehabilitation  ONSET DATE: 09/10/23  SUBJECTIVE:                                                                                                                                                                                                         SUBJECTIVE STATEMENT: Patient states she plan on returning back to her active seniors class on Friday. Patient reports her neck feels better when still; states shoulder is still iffy. Patient states she has not needed cervical collar.   EVAL: Pt states she was sick for a while and then returned to her active seniors group. She was having difficulty lifting her Lt shoulder when she returned and she saw MD. MD gave her an  injection in her Lt shoulder which she states caused her to have neck and Rt  shoulder pain. The pain was so bad she had to go to ED who gave her pain medicine. She reports the pain has decreased some but her main complaint is lack of strength in her shoulders and her neck. She states her neck continues with pain with walking heavy, prolonged standing with looking down. Pt uses a cervical collar that she states she uses very sparingly but it does help Hand dominance: Right  PERTINENT HISTORY:  Rt Total shoulder replacement Pacemaker History of breast cancer with mastectomy Bilateral carpal tunnel  PAIN:  Are you having pain? Yes: NPRS scale: 5-6 currently, 9/10 at worst Pain location: neck to bilat UEs Pain description: sharp Aggravating factors: prolonged standing with looking down, jolting Relieving factors: cervical collar  PRECAUTIONS: ICD/Pacemaker  RED FLAGS: None     WEIGHT BEARING RESTRICTIONS: No  FALLS:  Has patient fallen in last 6 months? Yes. Number of falls multiple - pt states when she has tachycardia she can fall    OCCUPATION: retired  PLOF: Independent  PATIENT GOALS: decrease pain, use her LT UE better  NEXT MD VISIT: 11/18/2023  OBJECTIVE:  Note: Objective measures were completed at Evaluation unless otherwise noted.  DIAGNOSTIC FINDINGS:  X ray performed but not read on Eval  PATIENT SURVEYS:  NDI 21/50 42%  COGNITION: Overall cognitive status: Within functional limits for tasks assessed  SENSATION: Pt reports she occasionally has numbness in her fingers bilat  POSTURE: rounded shoulders and forward head  PALPATION: Pt TTP throughout cervical musculature, very TTP on cervical spinous processes - unable to tolerate more than light touch with palpation   CERVICAL ROM:   Active ROM A/PROM (deg) eval  Flexion 20  Extension 15  Right lateral flexion 18  Left lateral flexion 11  Right rotation 40  Left rotation 30   (Blank rows = not  tested)  UPPER EXTREMITY ROM:  Lt shoulder limited to < 90 degrees flexion and abduction  UPPER EXTREMITY MMT:  MMT Right eval Left eval  Shoulder flexion 3 3-  Shoulder extension    Shoulder abduction 3 3-  Shoulder adduction    Shoulder extension    Shoulder internal rotation    Shoulder external rotation    Middle trapezius    Lower trapezius    Elbow flexion    Elbow extension    Wrist flexion    Wrist extension    Wrist ulnar deviation    Wrist radial deviation    Wrist pronation    Wrist supination    Grip strength     (Blank rows = not tested)    TREATMENT DATE:                                                                                                                              [facility] Adult PT Treatment:  DATE: 09/30/2023 Therapeutic Exercise: Seated with noodle: Scap squeezes Chest lift + shoulder blade depression Gentle UT & LS stretches Shoulder ER --> increased pain in bilateral hands Chin tucks 2x10 Shoulder shrugs (did not tolerate addition of YTB) Bkwd shoulder circles Gentle thoracic extension over horizontal noodle to tolerance (no noodle) Cervical rotation & lateral flexion + gentle assist from therapist for end-range stretch (no noodle)   [facility] Adult PT Treatment:                                                DATE: 09/28/23 Therapeutic Exercise: See HEP  Therapeutic Activity: Pt education on rationale for treatment, diagnosis, prognosis, relevant anatomy    PATIENT EDUCATION:  Education details: PT POC and goals, HEP Person educated: Patient Education method: Medical Illustrator Education comprehension: verbalized understanding and returned demonstration  HOME EXERCISE PROGRAM: Access Code: [email or web address] URL: [email or web address] Date: 09/30/2023 Prepared by: Lamarr Price  Exercises - Seated Cervical Retraction  - 1 x daily - 7 x weekly - 2 sets - 10 reps -  Seated Scapular Retraction  - 1 x daily - 7 x weekly - 3 sets - 10 reps - Gentle Levator Scapulae Stretch  - 1 x daily - 7 x weekly - 1 sets - 10 reps - 10 seconds hold - Seated Upper Trapezius Stretch  - 1 x daily - 7 x weekly - 1 sets - 10 reps - 10 seconds hold - Seated Shoulder Shrug  - 1 x daily - 7 x weekly - 3 sets - 10 reps - Seated Shoulder Shrug Circles AROM Backward  - 1 x daily - 7 x weekly - 3 sets - 10 reps  ASSESSMENT:  CLINICAL IMPRESSION: HEP reviewed and postural exercises progressed with focus on upright postural awareness. Gentle active assist provided during cervical mobility exercises. FWW adjusted to proper height for patient, improving postural alignment and alleviating shoulder discomfort.  EVAL: Patient is a 73 y.o. female who was seen today for physical therapy evaluation and treatment for cervical DDD. She presents with decreased strength and ROM, increased muscle spasticity and tenderness to palpation, decreased functional activity tolerance. Pt will benefit from skilled PT to address deficits and improve functional mobility with decreased pain.   OBJECTIVE IMPAIRMENTS: decreased activity tolerance, decreased ROM, decreased strength, increased muscle spasms, and pain.   ACTIVITY LIMITATIONS: standing  PARTICIPATION LIMITATIONS: meal prep, cleaning, and community activity  PERSONAL FACTORS: Time since onset of injury/illness/exacerbation and 3+ comorbidities: carpal tunnel, total shoulder replacement, arthritis  are also affecting patient's functional outcome.   REHAB POTENTIAL: Good  CLINICAL DECISION MAKING: Stable/uncomplicated  EVALUATION COMPLEXITY: Low   GOALS: Goals reviewed with patient? Yes  SHORT TERM GOALS: Target date: 10/26/2023  Pt will be independent in initial HEP Baseline:  Goal status: INITIAL  2.  Pt will improve cervical rotation ROM by 10 degrees bilat Baseline:  Goal status: INITIAL    LONG TERM GOALS: Target date:  11/23/2023  Pt will be independent with advanced HEP Baseline:  Goal status: INITIAL  2.  Pt will improve NDI to <= 22% to demo improved functional mobility Baseline:  42% Goal status: INITIAL  3.  Pt will report being able to wash dishes x 10 minutes with pain <= 2/10 Baseline:  Goal status: INITIAL  4.  Pt will be able to fully return to senior exercise  class with pain < = 2/10 Baseline:  Goal status: INITIAL   PLAN:  PT FREQUENCY: 2x/week  PT DURATION: 8 weeks  PLANNED INTERVENTIONS: 97164- PT Re-evaluation, 97110-Therapeutic exercises, 97530- Therapeutic activity, 97112- Neuromuscular re-education, 97535- Self Care, [id number]- Manual therapy, [phone]- Aquatic Therapy, Patient/Family education, Taping, Dry Needling, Cryotherapy, and Moist heat  PLAN FOR NEXT SESSION: Continue cervical mobility, postural strength   Lamarr GORMAN Price, PTA 09/30/2023, 10:15 AM

## 2023-09-30 NOTE — Patient Instructions (Signed)
 Visit Information  Thank you for taking time to visit with me today. Please don't hesitate to contact me if I can be of assistance to you.   Following are the goals we discussed today:  Expect a call from Dr. Davene office regarding your medication refill request. Continue to take medications as prescribed. Continue to attend provider visits as scheduled Continue to eat healthy, lean meats, vegetables, fruits, avoid saturated and transfats Contact provider with health questions or concerns as needed  Our next appointment is by telephone on 10/12/23 at 1:45 pm  Please call the care guide team at [phone] if you need to cancel or reschedule your appointment.   If you are experiencing a Mental Health or Behavioral Health Crisis or need someone to talk to, please call the Suicide and Crisis Lifeline: 988 call the [place]  National Suicide Prevention Lifeline: 602-269-6061 or TTY: 680-742-7543 TTY 564-570-6023) to talk to a trained counselor   [name], RN, MSN, BSN, CCM [place]  [facility], Population Health Case Manager Phone: [phone]

## 2023-10-04 ENCOUNTER — Ambulatory Visit
Admission: EM | Admit: 2023-10-04 | Discharge: 2023-10-04 | Disposition: A | Discharge status: Home / Self Care | Payer: 59 | Attending: Family Medicine | Admitting: Family Medicine

## 2023-10-04 ENCOUNTER — Other Ambulatory Visit: Payer: Self-pay

## 2023-10-04 ENCOUNTER — Encounter: Payer: Self-pay | Admitting: Emergency Medicine

## 2023-10-04 ENCOUNTER — Other Ambulatory Visit: Payer: Self-pay | Admitting: *Deleted

## 2023-10-04 DIAGNOSIS — E038 Other specified hypothyroidism: Secondary | ICD-10-CM

## 2023-10-04 DIAGNOSIS — K5909 Other constipation: Secondary | ICD-10-CM

## 2023-10-04 DIAGNOSIS — R1030 Lower abdominal pain, unspecified: Secondary | ICD-10-CM | POA: Diagnosis not present

## 2023-10-04 MED ORDER — TORSEMIDE 20 MG PO TABS: 20.0000 mg | ORAL_TABLET | Freq: Every day | ORAL | 3 refills | Status: DC

## 2023-10-04 MED ORDER — KETOROLAC TROMETHAMINE 30 MG/ML IJ SOLN
30.0000 mg | Freq: Once | INTRAMUSCULAR | Status: AC
  Administered 2023-10-04: 30 mg via INTRAMUSCULAR

## 2023-10-04 MED ORDER — CARVEDILOL 3.125 MG PO TABS: 3.1250 mg | ORAL_TABLET | Freq: Two times a day (BID) | ORAL | 3 refills | Status: DC

## 2023-10-04 MED ORDER — HYDRALAZINE HCL 10 MG PO TABS: 10.0000 mg | ORAL_TABLET | Freq: Two times a day (BID) | ORAL | 3 refills | Status: DC

## 2023-10-04 MED ORDER — ISOSORBIDE MONONITRATE ER 30 MG PO TB24: 30.0000 mg | ORAL_TABLET | Freq: Every day | ORAL | 3 refills | Status: DC

## 2023-10-04 MED ORDER — LEVOTHYROXINE SODIUM 112 MCG PO TABS: 112.0000 ug | ORAL_TABLET | Freq: Every day | ORAL | 0 refills | Status: DC

## 2023-10-04 NOTE — Addendum Note (Signed)
 Addended by: Gayleen Kawasaki D on: 10/04/2023 09:54 AM   Modules accepted: Orders

## 2023-10-04 NOTE — Discharge Instructions (Signed)
 Make sure that you are drinking lots of water Continue with your usual bowel medications The shot we gave you should help with your pain and discomfort Call Dr. Greer Leak or your gastric doctor tomorrow for advice regarding your stomach problems

## 2023-10-04 NOTE — ED Triage Notes (Signed)
 Patient states that she is having "stomach pain" x 4 days.  She is unable to have a BM and gas pain.  Patient recently was switched to [employer] so unsure if this is related or not.  Denies any urinary sxs.Aaron Aas

## 2023-10-04 NOTE — ED Provider Notes (Signed)
 [facility]    CSN: [id number] Arrival date & time: 10/04/23  1804      History   Chief Complaint Chief Complaint  Patient presents with   Abdominal Pain    HPI Tanya Hogan is a 73 y.o. female.   HPI Tanya Hogan is here for abdominal pain.  Tanya Hogan is also complaining of constipation.  Is also complaining of abdominal distention.  States that her colon has slow mobility.  Tanya Hogan has been on [employer].  Tanya Hogan has been on Linzess .  Tanya Hogan is also used Dulcolax.  States that Tanya Hogan can no longer use MiraLAX  because Tanya Hogan has a leaky gut.  Has Metamucil at home.  States that Tanya Hogan uses suppositories on a daily basis.  States that Tanya Hogan had an attempted a colonoscopy less than 2 weeks ago that was unsuccessful because of inability to clean out her bowel. Patient is here tired, tearful, states that Tanya Hogan needs help.  I explained to her that as an urgent care physician there is little I can do to treat her very complicated lifelong bowel problems.  I did offer her a shot of Toradol  for pain so Tanya Hogan can rest tonight until Tanya Hogan can get up with her GI doctors tomorrow Tanya Hogan does not have any fever or chills, nausea vomiting, or signs of an acute abdomen or obstruction Past Medical History:  Diagnosis Date   Alkaline phosphatase elevation    Asthma    Breast cancer (HCC) 2011   Carpal tunnel syndrome    bilateral   Chronic HFrEF (heart failure with reduced ejection fraction) (HCC)    Cirrhosis, non-alcoholic (HCC)    Closed fracture of unspecified part of radius (alone)    DDD (degenerative disc disease), lumbar    Depression    pt denies   Diabetes mellitus without complication (HCC)    Enlarged heart    Fibromyalgia    GERD (gastroesophageal reflux disease)    History of kidney stones    HTN (hypertension)    Hypothyroidism    IBS (irritable bowel syndrome)    LBBB (left bundle branch block)    Microcytic anemia 05/14/2017   Mitral regurgitation    NICM (nonischemic cardiomyopathy) (HCC)     Pneumonia    Presence of permanent cardiac pacemaker    Pulmonary hypertension (HCC)    PVD (peripheral vascular disease) (HCC)    2019   Sleep apnea    uses cpap   Tricuspid regurgitation    Ventricular tachyarrhythmia [facility])     Patient Active Problem List   Diagnosis Date Noted   Primary osteoarthritis of left shoulder 09/08/2023   Injection site reaction 05/17/2023   Nonischemic cardiomyopathy (HCC) 02/16/2023   Class 3 obesity 10/30/2022   Acute on chronic systolic CHF (congestive heart failure) (HCC) 10/29/2022   Stress and adjustment reaction 04/08/2022   Gastroesophageal reflux disease 02/16/2022   AV block, 3rd degree (HCC) 10/24/2021   COPD (chronic obstructive pulmonary disease) (HCC) 09/21/2021   Osteoporosis 02/28/2021   Primary osteoarthritis of both knees 01/14/2021   Irritable bowel syndrome with constipation 09/16/2020   Aortic atherosclerosis (HCC) 06/11/2020   Benign lipomatous neoplasm of skin and subcutaneous tissue of left leg 06/11/2020   NSVT (nonsustained ventricular tachycardia) (HCC) 02/14/2020   Chronic constipation 10/06/2019   Genetic testing 08/14/2019   Ductal carcinoma in situ (DCIS) of right breast 06/15/2019   Arthralgia 04/11/2019   Malignant tumor of breast (HCC) 03/06/2019   Fatigue 03/06/2019   DDD (degenerative disc disease),  cervical 11/19/2017   Peripheral edema 05/19/2017   Microcytic anemia 05/14/2017   Delayed sleep phase syndrome 10/29/2016   Primary osteoarthritis of left ankle 09/24/2016   Gastrointestinal food sensitivity 05/21/2016   History of arthroplasty of right shoulder 02/24/2016   Bilateral foot pain 01/22/2016   NASH (nonalcoholic steatohepatitis) 05/26/2015    Class: Stage 3   Nasal septal perforation 07/11/2014   Controlled diabetes mellitus type 2 with complications (HCC) 05/25/2014   Physical deconditioning 04/24/2014   Obesity, Class II, BMI 35-39.9, with comorbidity 06/09/2013   OSA (obstructive sleep  apnea) 06/08/2013   Chronic systolic heart failure (HCC) 05/23/2013   Asthma, moderate persistent 04/18/2013   Chronic cough 04/18/2013   Breast cancer of upper-inner quadrant of left female breast (HCC) 03/18/2010   Type 2 diabetes mellitus with hyperlipidemia (HCC) 11/07/2009   Fibromyalgia 10/29/2009   Biventricular cardiac pacemaker in situ 10/23/2009   Abnormal levels of other serum enzymes 10/13/2009   Hypothyroidism 10/03/2009   Essential hypertension 07/02/2009   Chronic back pain 07/02/2009    Past Surgical History:  Procedure Laterality Date   BIV PACEMAKER GENERATOR CHANGEOUT N/A 11/03/2021   Procedure: BIV PACEMAKER GENERATOR CHANGEOUT;  Surgeon: Tammie Fall, MD;  Location: [facility];  Service: Cardiovascular;  Laterality: N/A;   BREAST LUMPECTOMY Left 2011   COLONOSCOPY     FOOT SURGERY     KNEE SURGERY     LEAD REVISION/REPAIR N/A 11/03/2021   Procedure: LEAD REVISION/REPAIR;  Surgeon: Tammie Fall, MD;  Location: [facility];  Service: Cardiovascular;  Laterality: N/A;   MASTECTOMY Right    05/2019   PACEMAKER GENERATOR CHANGE N/A 08/23/2014   Procedure: PACEMAKER GENERATOR CHANGE;  Surgeon: Tammie Fall, MD;  Location: [facility] CATH LAB;  Service: Cardiovascular;  Laterality: N/A;   PACEMAKER PLACEMENT  2009   [facility]   RECTAL SURGERY  1976   REDUCTION MAMMAPLASTY Left    05/2019   TOTAL SHOULDER ARTHROPLASTY Right 03/10/2018   TOTAL SHOULDER ARTHROPLASTY Right 03/10/2018   Procedure: RIGHT TOTAL SHOULDER ARTHROPLASTY;  Surgeon: Sammye Cristal, MD;  Location: [facility] OR;  Service: Orthopedics;  Laterality: Right;   TUBAL LIGATION      OB History   No obstetric history on file.      Home Medications    Prior to Admission medications   Medication Sig Start Date End Date Taking? Authorizing Provider  albuterol  (PROVENTIL ) (2.5 MG/3ML) 0.083% nebulizer solution Take 2.5 mg by nebulization every 6 (six) hours as needed for wheezing  or shortness of breath.   Yes [provider]  albuterol  (VENTOLIN  HFA) 108 (90 Base) MCG/ACT inhaler Inhale 1-2 puffs into the lungs every 6 (six) hours as needed for wheezing or shortness of breath. 05/27/21  Yes Cydney Draft, MD  alendronate  (FOSAMAX ) 70 MG tablet TAKE 1 TABLET BY MOUTH EVERY 7  DAYS WITH FULL GLASS OF WATER ON AN EMPTY STOMACH 08/13/23  Yes Cydney Draft, MD  AMBULATORY NON FORMULARY MEDICATION Medication Name: Tubing and supplies for nebulizer. Fax - [phone] 08/14/19  Yes Cydney Draft, MD  amiodarone  (PACERONE ) 200 MG tablet Take 1 tablet (200 mg total) by mouth daily. 08/11/23  Yes Lenise Quince, MD  aspirin  81 MG chewable tablet Chew 81 mg by mouth daily.   Yes [provider]  Benzoyl Peroxide  (PANOXYL FOAMING WASH) 10 % LIQD Wash groin creases daily with this wash  Ok to sub something similar 04/28/23  Yes [name],  Corita Diego, MD  carvedilol  (COREG ) 3.125 MG tablet Take 1 tablet (3.125 mg total) by mouth 2 (two) times daily. 10/04/23 01/02/24 Yes Lenise Quince, MD  Continuous Glucose Receiver (FREESTYLE LIBRE 3 READER) DEVI Use for continuous glucose monitoring 03/22/23  Yes Cydney Draft, MD  Continuous Glucose Sensor (FREESTYLE LIBRE 3 SENSOR) MISC by Does not apply route. Change every 14 days   Yes [provider]  EPINEPHrine  0.3 mg/0.3 mL IJ SOAJ injection Inject 0.3 mg into the muscle as needed for anaphylaxis. 08/27/23  Yes Cydney Draft, MD  fexofenadine  (ALLEGRA ) 60 MG tablet Take 1 tablet (60 mg total) by mouth 2 (two) times daily. 08/14/23  Yes Scarlette Currier, MD  fluticasone -salmeterol (WIXELA INHUB ) 250-50 MCG/ACT AEPB Inhale 1 puff into the lungs in the morning and at bedtime.   Yes [provider]  glucose blood test strip To be used to check blood glucose Dx:E11.8 FreeStyle Precision blood glucose for Freestyle Libre 2 08/25/22  Yes Cydney Draft, MD  hydrALAZINE   (APRESOLINE ) 10 MG tablet Take 1 tablet (10 mg total) by mouth in the morning and at bedtime. 10/04/23  Yes Lenise Quince, MD  isosorbide  mononitrate (IMDUR ) 30 MG 24 hr tablet Take 1 tablet (30 mg total) by mouth daily. 10/04/23  Yes Lenise Quince, MD  levothyroxine  (SYNTHROID ) 112 MCG tablet Take 1 tablet (112 mcg total) by mouth daily. 10/04/23  Yes Cydney Draft, MD  lubiprostone (AMITIZA) 24 MCG capsule Take 24 mcg by mouth 2 (two) times daily with a meal. Patient reports provider has decreased to every three days.   Yes [provider]  meclizine  (ANTIVERT ) 25 MG tablet Take 1 tablet (25 mg total) by mouth 3 (three) times daily as needed for dizziness. 08/14/23  Yes Scarlette Currier, MD  metFORMIN  (GLUCOPHAGE -XR) 500 MG 24 hr tablet TAKE 1 TABLET BY MOUTH TWICE  DAILY WITH A MEAL 08/13/23  Yes Cydney Draft, MD  Multiple Vitamins-Minerals (MULTIPLE VITAMINS/WOMENS PO) Take 1 tablet by mouth daily.   Yes [provider]  Multiple Vitamins-Minerals (OCUVITE EYE + MULTI) TABS Take 1 tablet by mouth in the morning.   Yes [provider]  pantoprazole  (PROTONIX ) 40 MG tablet TAKE 1 TABLET BY MOUTH DAILY  BEFORE BREAKFAST 10/20/22  Yes Cydney Draft, MD  pravastatin  (PRAVACHOL ) 40 MG tablet TAKE 1 TABLET BY MOUTH AT  BEDTIME 07/21/23  Yes Cydney Draft, MD  Tiotropium Bromide  Monohydrate (SPIRIVA  RESPIMAT) 1.25 MCG/ACT AERS Inhale 2 puffs into the lungs daily.   Yes [provider]  torsemide  (DEMADEX ) 20 MG tablet Take 1 tablet (20 mg total) by mouth daily. 10/04/23  Yes Lenise Quince, MD  valACYclovir  (VALTREX ) 1000 MG tablet Take 1 tablet (1,000 mg total) by mouth 2 (two) times daily. 05/17/23  Yes Adela Holter, DO    Family History Family History  Problem Relation Age of Onset   Heart disease Mother    Melanoma Mother    Parkinson's disease Mother    Heart disease Father    Heart disease Brother    Heart failure  Other    Breast cancer Other    Hypertension Other     Social History Social History   Tobacco Use   Smoking status: Former    Current packs/day: 0.00    Types: Cigarettes    Quit date: 08/24/1966    Years since quitting: 57.1   Smokeless tobacco: Never  Vaping Use   Vaping status:  Never Used  Substance Use Topics   Alcohol use: No   Drug use: No     Allergies   Injectafer [ferric carboxymaltose], Penicillins, Accupril [quinapril hcl], Aleve  [naproxen ], Basle, Celebrex [celecoxib], Cozaar  [losartan  potassium], Flonase  [fluticasone ], Lactose intolerance (gi), Levomenol, Lipitor [atorvastatin ], Nasonex  [mometasone  furoate], Xyzal [levocetirizine], Bentyl  [dicyclomine ], Chamomile, Entresto [sacubitril-valsartan], Singulair  [montelukast ], Vesicare  [solifenacin ], Aspirin , Baking soda-fluoride [sodium fluoride], Clindamycin /lincomycin, Codeine, Furosemide , Lactose, Lanolin-petrolatum, Miralax  [polyethylene glycol], Quinine, Reclast  [zoledronic  acid], Rifampin, Sulfa antibiotics, Sulfonamide derivatives, Tramadol , Wellbutrin  [bupropion ], and Wool alcohol [lanolin]   Review of Systems Review of Systems See  hpi  Physical Exam Triage Vital Signs ED Triage Vitals  Encounter Vitals Group     BP 10/04/23 1842 (!) 144/83     Systolic BP Percentile --      Diastolic BP Percentile --      Pulse Rate 10/04/23 1842 95     Resp 10/04/23 1842 18     Temp 10/04/23 1842 97.9 F (36.6 C)     Temp Source 10/04/23 1842 Oral     SpO2 10/04/23 1842 94 %     Weight 10/04/23 1845 206 lb (93.4 kg)     Height 10/04/23 1845 5' (1.524 m)     Head Circumference --      Peak Flow --      Pain Score 10/04/23 1844 8     Pain Loc --      Pain Education --      Exclude from Growth Chart --    No data found.  Updated Vital Signs BP (!) 144/83 (BP Location: Left Arm)   Pulse 95   Temp 97.9 F (36.6 C) (Oral)   Resp 18   Ht 5' (1.524 m)   Wt 93.4 kg   SpO2 94%   BMI 40.23 kg/m      Physical  Exam Constitutional:      General: Tanya Hogan is not in acute distress.    Appearance: Tanya Hogan is well-developed. Tanya Hogan is obese. Tanya Hogan is ill-appearing.  HENT:     Head: Normocephalic and atraumatic.  Eyes:     Conjunctiva/sclera: Conjunctivae normal.     Pupils: Pupils are equal, round, and reactive to light.  Cardiovascular:     Rate and Rhythm: Normal rate.  Pulmonary:     Effort: Pulmonary effort is normal. No respiratory distress.  Abdominal:     General: Bowel sounds are increased. There is no distension.     Palpations: Abdomen is soft. There is no hepatomegaly or splenomegaly.     Tenderness: There is generalized abdominal tenderness.  Musculoskeletal:        General: Normal range of motion.     Cervical back: Normal range of motion.  Skin:    General: Skin is warm and dry.  Neurological:     Mental Status: Tanya Hogan is alert.      UC Treatments / Results  Labs (all labs ordered are listed, but only abnormal results are displayed) Labs Reviewed - No data to display  EKG   Radiology No results found.  Procedures Procedures (including critical care time)  Medications Ordered in UC Medications  ketorolac  (TORADOL ) 30 MG/ML injection 30 mg (30 mg Intramuscular Given 10/04/23 1917)    Initial Impression / Assessment and Plan / UC Course  I have reviewed the triage vital signs and the nursing notes.  Pertinent labs & imaging results that were available during my care of the patient were reviewed by me and considered in my medical decision making (  see chart for details).     Final Clinical Impressions(s) / UC Diagnoses   Final diagnoses:  Lower abdominal pain  Chronic constipation with overflow     Discharge Instructions      Make sure that you are drinking lots of water Continue with your usual bowel medications The shot we gave you should help with your pain and discomfort Call Dr. Greer Leak or your gastric doctor tomorrow for advice regarding your stomach  problems   ED Prescriptions   None    PDMP not reviewed this encounter.   Stephany Ehrich, MD 10/04/23 [phone]

## 2023-10-05 ENCOUNTER — Ambulatory Visit: Payer: 59 | Admitting: Physical Therapy

## 2023-10-05 ENCOUNTER — Encounter: Payer: Self-pay | Admitting: Physical Therapy

## 2023-10-05 DIAGNOSIS — M47892 Other spondylosis, cervical region: Secondary | ICD-10-CM | POA: Diagnosis not present

## 2023-10-05 DIAGNOSIS — M5030 Other cervical disc degeneration, unspecified cervical region: Secondary | ICD-10-CM | POA: Diagnosis not present

## 2023-10-05 DIAGNOSIS — M62838 Other muscle spasm: Secondary | ICD-10-CM | POA: Diagnosis not present

## 2023-10-05 DIAGNOSIS — R29898 Other symptoms and signs involving the musculoskeletal system: Secondary | ICD-10-CM

## 2023-10-05 DIAGNOSIS — Z950 Presence of cardiac pacemaker: Secondary | ICD-10-CM | POA: Diagnosis not present

## 2023-10-05 DIAGNOSIS — M542 Cervicalgia: Secondary | ICD-10-CM

## 2023-10-05 NOTE — Therapy (Signed)
OUTPATIENT PHYSICAL THERAPY CERVICAL TREATMENT   Patient Name: Tanya Hogan MRN: [id number] DOB:[birth year], 73 y.o., female Today's Date: 10/05/2023  END OF SESSION:  PT End of Session - 10/05/23 0920     Visit Number 3    Number of Visits 16    Date for PT Re-Evaluation 11/23/23    Authorization Type [employer] Medicare    Authorization - Visit Number 3    Progress Note Due on Visit 10    PT Start Time [phone]    PT Stop Time 0917    PT Time Calculation (min) 39 min    Activity Tolerance Patient tolerated treatment well    Behavior During Therapy [facility] for tasks assessed/performed             Past Medical History:  Diagnosis Date   Alkaline phosphatase elevation    Asthma    Breast cancer (HCC) 2011   Carpal tunnel syndrome    bilateral   Chronic HFrEF (heart failure with reduced ejection fraction) (HCC)    Cirrhosis, non-alcoholic (HCC)    Closed fracture of unspecified part of radius (alone)    DDD (degenerative disc disease), lumbar    Depression    pt denies   Diabetes mellitus without complication (HCC)    Enlarged heart    Fibromyalgia    GERD (gastroesophageal reflux disease)    History of kidney stones    HTN (hypertension)    Hypothyroidism    IBS (irritable bowel syndrome)    LBBB (left bundle branch block)    Microcytic anemia 05/14/2017   Mitral regurgitation    NICM (nonischemic cardiomyopathy) (HCC)    Pneumonia    Presence of permanent cardiac pacemaker    Pulmonary hypertension (HCC)    PVD (peripheral vascular disease) (HCC)    2019   Sleep apnea    uses cpap   Tricuspid regurgitation    Ventricular tachyarrhythmia [facility])    Past Surgical History:  Procedure Laterality Date   BIV PACEMAKER GENERATOR CHANGEOUT N/A 11/03/2021   Procedure: BIV PACEMAKER GENERATOR CHANGEOUT;  Surgeon: Marinus Maw, MD;  Location: [facility] INVASIVE CV LAB;  Service: Cardiovascular;  Laterality: N/A;   BREAST LUMPECTOMY Left 2011   COLONOSCOPY     FOOT SURGERY      KNEE SURGERY     LEAD REVISION/REPAIR N/A 11/03/2021   Procedure: LEAD REVISION/REPAIR;  Surgeon: Marinus Maw, MD;  Location: [facility] INVASIVE CV LAB;  Service: Cardiovascular;  Laterality: N/A;   MASTECTOMY Right    05/2019   PACEMAKER GENERATOR CHANGE N/A 08/23/2014   Procedure: PACEMAKER GENERATOR CHANGE;  Surgeon: Marinus Maw, MD;  Location: [facility] CATH LAB;  Service: Cardiovascular;  Laterality: N/A;   PACEMAKER PLACEMENT  2009   [place] [facility]   RECTAL SURGERY  1976   REDUCTION MAMMAPLASTY Left    05/2019   TOTAL SHOULDER ARTHROPLASTY Right 03/10/2018   TOTAL SHOULDER ARTHROPLASTY Right 03/10/2018   Procedure: RIGHT TOTAL SHOULDER ARTHROPLASTY;  Surgeon: Jones Broom, MD;  Location: [facility] OR;  Service: Orthopedics;  Laterality: Right;   TUBAL LIGATION     Patient Active Problem List   Diagnosis Date Noted   Primary osteoarthritis of left shoulder 09/08/2023   Injection site reaction 05/17/2023   Nonischemic cardiomyopathy (HCC) 02/16/2023   Class 3 obesity 10/30/2022   Acute on chronic systolic CHF (congestive heart failure) (HCC) 10/29/2022   Stress and adjustment reaction 04/08/2022   Gastroesophageal reflux disease 02/16/2022   AV block, 3rd degree (HCC) 10/24/2021  COPD (chronic obstructive pulmonary disease) (HCC) 09/21/2021   Osteoporosis 02/28/2021   Primary osteoarthritis of both knees 01/14/2021   Irritable bowel syndrome with constipation 09/16/2020   Aortic atherosclerosis (HCC) 06/11/2020   Benign lipomatous neoplasm of skin and subcutaneous tissue of left leg 06/11/2020   NSVT (nonsustained ventricular tachycardia) (HCC) 02/14/2020   Chronic constipation 10/06/2019   Genetic testing 08/14/2019   Ductal carcinoma in situ (DCIS) of right breast 06/15/2019   Arthralgia 04/11/2019   Malignant tumor of breast (HCC) 03/06/2019   Fatigue 03/06/2019   DDD (degenerative disc disease), cervical 11/19/2017   Peripheral edema 05/19/2017   Microcytic anemia  05/14/2017   Delayed sleep phase syndrome 10/29/2016   Primary osteoarthritis of left ankle 09/24/2016   Gastrointestinal food sensitivity 05/21/2016   History of arthroplasty of right shoulder 02/24/2016   Bilateral foot pain 01/22/2016   NASH (nonalcoholic steatohepatitis) 05/26/2015    Class: Stage 3   Nasal septal perforation 07/11/2014   Controlled diabetes mellitus type 2 with complications (HCC) 05/25/2014   Physical deconditioning 04/24/2014   Obesity, Class II, BMI 35-39.9, with comorbidity 06/09/2013   OSA (obstructive sleep apnea) 06/08/2013   Chronic systolic heart failure (HCC) 05/23/2013   Asthma, moderate persistent 04/18/2013   Chronic cough 04/18/2013   Breast cancer of upper-inner quadrant of left female breast (HCC) 03/18/2010   Type 2 diabetes mellitus with hyperlipidemia (HCC) 11/07/2009   Fibromyalgia 10/29/2009   Biventricular cardiac pacemaker in situ 10/23/2009   Abnormal levels of other serum enzymes 10/13/2009   Hypothyroidism 10/03/2009   Essential hypertension 07/02/2009   Chronic back pain 07/02/2009    PCP: Linford Arnold   REFERRING PROVIDER: Benjamin Stain  REFERRING DIAG: cervical DDD  THERAPY DIAG:  Cervicalgia  Other symptoms and signs involving the musculoskeletal system  Rationale for Evaluation and Treatment: Rehabilitation  ONSET DATE: 09/10/23  SUBJECTIVE:                                                                                                                                                                                                         SUBJECTIVE STATEMENT: Pt states she was in the urgent care last night due to stomach issues. She is feeling better from that this morning but feels "worn out". She was able to go to her senior exercise class last week and she just feels "stiff" from the work out  EVAL: Pt states she was sick for a while and then returned to her "active seniors" group. She was having difficulty lifting her Lt  shoulder when she returned and she saw  MD. MD gave her an injection in her Lt shoulder which she states caused her to have neck and Rt shoulder pain. The pain was so bad she had to go to ED who gave her pain medicine. She reports the pain has decreased some but her main complaint is lack of strength in her shoulders and her neck. She states her neck continues with pain with walking "heavy", prolonged standing with looking down. Pt uses a cervical collar that she states she uses very sparingly but it does help Hand dominance: Right  PERTINENT HISTORY:  Rt Total shoulder replacement Pacemaker History of breast cancer with mastectomy Bilateral carpal tunnel  PAIN:  Are you having pain? Yes: NPRS scale: 4 currently, 9/10 at worst Pain location: neck to bilat UEs Pain description: sharp Aggravating factors: prolonged standing with looking down, "jolting" Relieving factors: cervical collar  PRECAUTIONS: ICD/Pacemaker  RED FLAGS: None     WEIGHT BEARING RESTRICTIONS: No  FALLS:  Has patient fallen in last 6 months? Yes. Number of falls multiple - pt states when she has tachycardia she can fall    OCCUPATION: retired  PLOF: Independent  PATIENT GOALS: decrease pain, use her LT UE better  NEXT MD VISIT: 11/18/2023  OBJECTIVE:  Note: Objective measures were completed at Evaluation unless otherwise noted.  DIAGNOSTIC FINDINGS:  X ray performed but not read on Eval  PATIENT SURVEYS:  NDI 21/50 42%  COGNITION: Overall cognitive status: Within functional limits for tasks assessed  SENSATION: Pt reports she occasionally has numbness in her fingers bilat  POSTURE: rounded shoulders and forward head  PALPATION: Pt TTP throughout cervical musculature, very TTP on cervical spinous processes - unable to tolerate more than light touch with palpation   CERVICAL ROM:   Active ROM A/PROM (deg) eval  Flexion 20  Extension 15  Right lateral flexion 18  Left lateral flexion 11   Right rotation 40  Left rotation 30   (Blank rows = not tested)  UPPER EXTREMITY ROM:  Lt shoulder limited to < 90 degrees flexion and abduction  UPPER EXTREMITY MMT:  MMT Right eval Left eval  Shoulder flexion 3 3-  Shoulder extension    Shoulder abduction 3 3-  Shoulder adduction    Shoulder extension    Shoulder internal rotation    Shoulder external rotation    Middle trapezius    Lower trapezius    Elbow flexion    Elbow extension    Wrist flexion    Wrist extension    Wrist ulnar deviation    Wrist radial deviation    Wrist pronation    Wrist supination    Grip strength     (Blank rows = not tested)    TREATMENT DATE:                                                                                                                              [facility] Adult PT Treatment:  DATE: 10/05/23 Therapeutic Exercise/Activity: Pulley flexion x 2 mins Table slide flexion 10 x 10 sec Seated with noodle: Scap squeeze Gentle UT and levator stretches Bilat ER x 15 Backward shoulder rolls Chin tuck - increases symptoms down arm Shoulder shrugs/depression 2 x 10 Gentle thoracic extension (no noodle) Manual Therapy: Gentle PROM Lt shoulder   [facility] Adult PT Treatment:                                                DATE: 09/30/2023 Therapeutic Exercise: Seated with noodle: Scap squeezes Chest lift + shoulder blade depression Gentle UT & LS stretches Shoulder ER --> increased pain in bilateral hands Chin tucks 2x10 Shoulder shrugs (did not tolerate addition of YTB) Bkwd shoulder circles Gentle thoracic extension over horizontal noodle to tolerance (no noodle) Cervical rotation & lateral flexion + gentle assist from therapist for end-range stretch (no noodle)    PATIENT EDUCATION:  Education details: PT POC and goals, HEP Person educated: Patient Education method: Medical illustrator Education comprehension:  verbalized understanding and returned demonstration  HOME EXERCISE PROGRAM: Access Code: [email or web address] URL: [email or web address] Date: 10/05/2023 Prepared by: Reggy Eye  Exercises - Seated Cervical Retraction  - 1 x daily - 7 x weekly - 2 sets - 10 reps - Seated Scapular Retraction  - 1 x daily - 7 x weekly - 3 sets - 10 reps - Gentle Levator Scapulae Stretch  - 1 x daily - 7 x weekly - 1 sets - 10 reps - 10 seconds hold - Seated Upper Trapezius Stretch  - 1 x daily - 7 x weekly - 1 sets - 10 reps - 10 seconds hold - Seated Shoulder Shrug  - 1 x daily - 7 x weekly - 3 sets - 10 reps - Seated Shoulder Shrug Circles AROM Backward  - 1 x daily - 7 x weekly - 3 sets - 10 reps - Seated Bilateral Shoulder Flexion Towel Slide at Table Top  - 1 x daily - 7 x weekly - 1 sets - 10 reps - 10 seconds hold  ASSESSMENT:  CLINICAL IMPRESSION: Pt states her shoulder is more bothersome than her neck today. Added pulley flexion and table slides to address shoulder mobility. Pt did respond well to manual work.  OBJECTIVE IMPAIRMENTS: decreased activity tolerance, decreased ROM, decreased strength, increased muscle spasms, and pain.    GOALS: Goals reviewed with patient? Yes  SHORT TERM GOALS: Target date: 10/26/2023  Pt will be independent in initial HEP Baseline:  Goal status: INITIAL  2.  Pt will improve cervical rotation ROM by 10 degrees bilat Baseline:  Goal status: INITIAL    LONG TERM GOALS: Target date: 11/23/2023  Pt will be independent with advanced HEP Baseline:  Goal status: INITIAL  2.  Pt will improve NDI to <= 22% to demo improved functional mobility Baseline:  42% Goal status: INITIAL  3.  Pt will report being able to wash dishes x 10 minutes with pain <= 2/10 Baseline:  Goal status: INITIAL  4.  Pt will be able to fully return to senior exercise class with pain < = 2/10 Baseline:  Goal status: INITIAL   PLAN:  PT FREQUENCY: 2x/week  PT  DURATION: 8 weeks  PLANNED INTERVENTIONS: 97164- PT Re-evaluation, 97110-Therapeutic exercises, 97530- Therapeutic activity, [id number]- Neuromuscular re-education, 97535- Self Care, [id number]- Manual therapy, [id number]- Aquatic Therapy, Patient/Family  education, Taping, Dry Needling, Cryotherapy, and Moist heat  PLAN FOR NEXT SESSION: Continue cervical mobility, postural strength   Nettie Cromwell, PT 10/05/2023, 9:21 AM

## 2023-10-06 DIAGNOSIS — K580 Irritable bowel syndrome with diarrhea: Secondary | ICD-10-CM | POA: Diagnosis not present

## 2023-10-06 DIAGNOSIS — K581 Irritable bowel syndrome with constipation: Secondary | ICD-10-CM | POA: Diagnosis not present

## 2023-10-06 DIAGNOSIS — K9289 Other specified diseases of the digestive system: Secondary | ICD-10-CM | POA: Diagnosis not present

## 2023-10-06 DIAGNOSIS — K7401 Hepatic fibrosis, early fibrosis: Secondary | ICD-10-CM | POA: Diagnosis not present

## 2023-10-07 ENCOUNTER — Encounter: Payer: Self-pay | Admitting: Family Medicine

## 2023-10-07 ENCOUNTER — Ambulatory Visit (INDEPENDENT_AMBULATORY_CARE_PROVIDER_SITE_OTHER): Payer: 59 | Admitting: Family Medicine

## 2023-10-07 VITALS — BP 117/57 | HR 72 | Ht 61.0 in | Wt 207.0 lb

## 2023-10-07 DIAGNOSIS — K5909 Other constipation: Secondary | ICD-10-CM | POA: Diagnosis not present

## 2023-10-07 DIAGNOSIS — Z95811 Presence of heart assist device: Secondary | ICD-10-CM | POA: Diagnosis not present

## 2023-10-07 DIAGNOSIS — E785 Hyperlipidemia, unspecified: Secondary | ICD-10-CM

## 2023-10-07 DIAGNOSIS — Z7984 Long term (current) use of oral hypoglycemic drugs: Secondary | ICD-10-CM | POA: Diagnosis not present

## 2023-10-07 DIAGNOSIS — E1169 Type 2 diabetes mellitus with other specified complication: Secondary | ICD-10-CM

## 2023-10-07 MED ORDER — PRAVASTATIN SODIUM 40 MG PO TABS: 40.0000 mg | ORAL_TABLET | Freq: Every day | ORAL | 3 refills | Status: DC

## 2023-10-07 NOTE — Assessment & Plan Note (Signed)
Following with cardiology.

## 2023-10-07 NOTE — Progress Notes (Signed)
Established Patient Office Visit  Subjective  Patient ID: Tanya Hogan, female    DOB: Jun 14, 1951  Age: 73 y.o. MRN: [id number]  Chief Complaint  Patient presents with   Medical Management of Chronic Issues    HPI  Here today to discuss her bowel issues she just saw GI yesterday.  She still continues to have hard stool in her colon she feels like it really started when she was on the GLP-1.  She takes Dulcolax sometimes multiple times a day and also Amitiza.  They decided to add neomycin as well as a round of self-exam.  It sounds that this of exam did get approved but the BMI to send and is not ready to pick up yet.  She says that they also will refer her for formal pelvic PT.  I did discuss how this could be helpful but she is worried about cost and co-pays right now.  Also been taking some probiotics but feels like they just make her gassy.    ROS    Objective:     BP (!) 117/57   Pulse 72   Ht 5\' 1"  (1.549 m)   Wt 207 lb (93.9 kg)   SpO2 94%   BMI 39.11 kg/m    Physical Exam Vitals and nursing note reviewed.  Constitutional:      Appearance: Normal appearance.  HENT:     Head: Normocephalic and atraumatic.  Eyes:     Conjunctiva/sclera: Conjunctivae normal.  Cardiovascular:     Rate and Rhythm: Normal rate and regular rhythm.  Pulmonary:     Effort: Pulmonary effort is normal.     Breath sounds: Normal breath sounds.  Skin:    General: Skin is warm and dry.  Neurological:     Mental Status: She is alert.  Psychiatric:        Mood and Affect: Mood normal.      No results found for any visits on 10/07/23.    The ASCVD Risk score (Arnett DK, et al., 2019) failed to calculate for the following reasons:   Risk score cannot be calculated because patient has a medical history suggesting prior/existing ASCVD    Assessment & Plan:   Problem List Items Addressed This Visit       Digestive   Chronic constipation     Endocrine   Type 2 diabetes  mellitus with hyperlipidemia (HCC) - Primary   Continue pravastatin.  I am going to try changing it to a 90-day supply.      Relevant Medications   pravastatin (PRAVACHOL) 40 MG tablet     Other   Presence of heart assist device [facility])   Following with cardiology.       Constipation.  I think she should at least get the neomycin and cefixime the 2-week trial and see if it is helpful.  She is a little hesitant I think to start formal PT because of cost so I think it is reasonable to wait until she does try with the medication but I do think it would be helpful for her and we could certainly do it at [facility] in [place] or through [facility].  She was not quite sure if she wanted a second opinion at this point in time, but I am happy to place referral if at some point she decides to do so.  Just encouraged her to let me know.  No follow-ups on file.   I spent 30 minutes on the day of the  encounter to include pre-visit record review, face-to-face time with the patient and post visit ordering of test.   Nani Gasser, MD

## 2023-10-07 NOTE — Assessment & Plan Note (Signed)
Continue pravastatin.  I am going to try changing it to a 90-day supply.

## 2023-10-08 ENCOUNTER — Ambulatory Visit: Payer: 59

## 2023-10-08 ENCOUNTER — Ambulatory Visit: Payer: Self-pay | Admitting: Licensed Clinical Social Worker

## 2023-10-08 DIAGNOSIS — R29898 Other symptoms and signs involving the musculoskeletal system: Secondary | ICD-10-CM

## 2023-10-08 DIAGNOSIS — M47892 Other spondylosis, cervical region: Secondary | ICD-10-CM | POA: Diagnosis not present

## 2023-10-08 DIAGNOSIS — M5030 Other cervical disc degeneration, unspecified cervical region: Secondary | ICD-10-CM | POA: Diagnosis not present

## 2023-10-08 DIAGNOSIS — Z950 Presence of cardiac pacemaker: Secondary | ICD-10-CM | POA: Diagnosis not present

## 2023-10-08 DIAGNOSIS — M62838 Other muscle spasm: Secondary | ICD-10-CM | POA: Diagnosis not present

## 2023-10-08 DIAGNOSIS — M542 Cervicalgia: Secondary | ICD-10-CM

## 2023-10-08 NOTE — Therapy (Signed)
OUTPATIENT PHYSICAL THERAPY CERVICAL TREATMENT   Patient Name: Tanya Hogan MRN: [id number] DOB:April 01, 1951, 73 y.o., female Today's Date: 10/08/2023  END OF SESSION:  PT End of Session - 10/08/23 0847     Visit Number 4    Number of Visits 16    Date for PT Re-Evaluation 11/23/23    Authorization Type [employer] Medicare    Progress Note Due on Visit 10    PT Start Time 0848    PT Stop Time 0928    PT Time Calculation (min) 40 min    Activity Tolerance Patient tolerated treatment well    Behavior During Therapy [facility] for tasks assessed/performed             Past Medical History:  Diagnosis Date   Alkaline phosphatase elevation    Asthma    Breast cancer (HCC) 2011   Carpal tunnel syndrome    bilateral   Chronic HFrEF (heart failure with reduced ejection fraction) (HCC)    Cirrhosis, non-alcoholic (HCC)    Closed fracture of unspecified part of radius (alone)    DDD (degenerative disc disease), lumbar    Depression    pt denies   Diabetes mellitus without complication (HCC)    Enlarged heart    Fibromyalgia    GERD (gastroesophageal reflux disease)    History of kidney stones    HTN (hypertension)    Hypothyroidism    IBS (irritable bowel syndrome)    LBBB (left bundle branch block)    Microcytic anemia 05/14/2017   Mitral regurgitation    NICM (nonischemic cardiomyopathy) (HCC)    Pneumonia    Presence of permanent cardiac pacemaker    Pulmonary hypertension (HCC)    PVD (peripheral vascular disease) (HCC)    2019   Sleep apnea    uses cpap   Tricuspid regurgitation    Ventricular tachyarrhythmia [facility])    Past Surgical History:  Procedure Laterality Date   BIV PACEMAKER GENERATOR CHANGEOUT N/A 11/03/2021   Procedure: BIV PACEMAKER GENERATOR CHANGEOUT;  Surgeon: Marinus Maw, MD;  Location: [facility] INVASIVE CV LAB;  Service: Cardiovascular;  Laterality: N/A;   BREAST LUMPECTOMY Left 2011   COLONOSCOPY     FOOT SURGERY     KNEE SURGERY     LEAD  REVISION/REPAIR N/A 11/03/2021   Procedure: LEAD REVISION/REPAIR;  Surgeon: Marinus Maw, MD;  Location: [facility] INVASIVE CV LAB;  Service: Cardiovascular;  Laterality: N/A;   MASTECTOMY Right    05/2019   PACEMAKER GENERATOR CHANGE N/A 08/23/2014   Procedure: PACEMAKER GENERATOR CHANGE;  Surgeon: Marinus Maw, MD;  Location: [facility] CATH LAB;  Service: Cardiovascular;  Laterality: N/A;   PACEMAKER PLACEMENT  2009   [facility]   RECTAL SURGERY  1976   REDUCTION MAMMAPLASTY Left    05/2019   TOTAL SHOULDER ARTHROPLASTY Right 03/10/2018   TOTAL SHOULDER ARTHROPLASTY Right 03/10/2018   Procedure: RIGHT TOTAL SHOULDER ARTHROPLASTY;  Surgeon: Jones Broom, MD;  Location: [facility] OR;  Service: Orthopedics;  Laterality: Right;   TUBAL LIGATION     Patient Active Problem List   Diagnosis Date Noted   Presence of heart assist device (HCC) 10/07/2023   Primary osteoarthritis of left shoulder 09/08/2023   Injection site reaction 05/17/2023   Nonischemic cardiomyopathy (HCC) 02/16/2023   Class 3 obesity 10/30/2022   Acute on chronic systolic CHF (congestive heart failure) (HCC) 10/29/2022   Stress and adjustment reaction 04/08/2022   Gastroesophageal reflux disease 02/16/2022   AV block, 3rd degree (HCC)  10/24/2021   COPD (chronic obstructive pulmonary disease) (HCC) 09/21/2021   Osteoporosis 02/28/2021   Primary osteoarthritis of both knees 01/14/2021   Irritable bowel syndrome with constipation 09/16/2020   Aortic atherosclerosis (HCC) 06/11/2020   Benign lipomatous neoplasm of skin and subcutaneous tissue of left leg 06/11/2020   NSVT (nonsustained ventricular tachycardia) (HCC) 02/14/2020   Chronic constipation 10/06/2019   Genetic testing 08/14/2019   Ductal carcinoma in situ (DCIS) of right breast 06/15/2019   Arthralgia 04/11/2019   Malignant tumor of breast (HCC) 03/06/2019   Fatigue 03/06/2019   DDD (degenerative disc disease), cervical 11/19/2017   Peripheral edema 05/19/2017    Microcytic anemia 05/14/2017   Delayed sleep phase syndrome 10/29/2016   Primary osteoarthritis of left ankle 09/24/2016   Gastrointestinal food sensitivity 05/21/2016   History of arthroplasty of right shoulder 02/24/2016   Bilateral foot pain 01/22/2016   NASH (nonalcoholic steatohepatitis) 05/26/2015    Class: Stage 3   Nasal septal perforation 07/11/2014   Controlled diabetes mellitus type 2 with complications (HCC) 05/25/2014   Physical deconditioning 04/24/2014   Obesity, Class II, BMI 35-39.9, with comorbidity 06/09/2013   OSA (obstructive sleep apnea) 06/08/2013   Chronic systolic heart failure (HCC) 05/23/2013   Asthma, moderate persistent 04/18/2013   Chronic cough 04/18/2013   Breast cancer of upper-inner quadrant of left female breast (HCC) 03/18/2010   Type 2 diabetes mellitus with hyperlipidemia (HCC) 11/07/2009   Fibromyalgia 10/29/2009   Biventricular cardiac pacemaker in situ 10/23/2009   Abnormal levels of other serum enzymes 10/13/2009   Hypothyroidism 10/03/2009   Essential hypertension 07/02/2009   Chronic back pain 07/02/2009    PCP: Linford Arnold   REFERRING PROVIDER: Benjamin Stain  REFERRING DIAG: cervical DDD  THERAPY DIAG:  Cervicalgia  Other symptoms and signs involving the musculoskeletal system  Rationale for Evaluation and Treatment: Rehabilitation  ONSET DATE: 09/10/23  SUBJECTIVE:                                                                                                                                                                                                         SUBJECTIVE STATEMENT: Patient states she is able to loft her L arm higher but "it is weak".  EVAL: Pt states she was sick for a while and then returned to her "active seniors" group. She was having difficulty lifting her Lt shoulder when she returned and she saw MD. MD gave her an injection in her Lt shoulder which she states caused her to have neck and Rt shoulder pain.  The pain was so bad she had  to go to ED who gave her pain medicine. She reports the pain has decreased some but her main complaint is lack of strength in her shoulders and her neck. She states her neck continues with pain with walking "heavy", prolonged standing with looking down. Pt uses a cervical collar that she states she uses very sparingly but it does help Hand dominance: Right  PERTINENT HISTORY:  Rt Total shoulder replacement Pacemaker History of breast cancer with mastectomy Bilateral carpal tunnel  PAIN:  Are you having pain? Yes: NPRS scale: 4 currently, 9/10 at worst Pain location: neck to bilat UEs Pain description: sharp Aggravating factors: prolonged standing with looking down, "jolting" Relieving factors: cervical collar  PRECAUTIONS: ICD/Pacemaker  RED FLAGS: None     WEIGHT BEARING RESTRICTIONS: No  FALLS:  Has patient fallen in last 6 months? Yes. Number of falls multiple - pt states when she has tachycardia she can fall    OCCUPATION: retired  PLOF: Independent  PATIENT GOALS: decrease pain, use her LT UE better  NEXT MD VISIT: 11/18/2023  OBJECTIVE:  Note: Objective measures were completed at Evaluation unless otherwise noted.  DIAGNOSTIC FINDINGS:  X ray performed but not read on Eval  PATIENT SURVEYS:  NDI 21/50 42%  COGNITION: Overall cognitive status: Within functional limits for tasks assessed  SENSATION: Pt reports she occasionally has numbness in her fingers bilat  POSTURE: rounded shoulders and forward head  PALPATION: Pt TTP throughout cervical musculature, very TTP on cervical spinous processes - unable to tolerate more than light touch with palpation   CERVICAL ROM:   Active ROM A/PROM (deg) eval  Flexion 20  Extension 15  Right lateral flexion 18  Left lateral flexion 11  Right rotation 40  Left rotation 30   (Blank rows = not tested)  UPPER EXTREMITY ROM:  Lt shoulder limited to < 90 degrees flexion and  abduction  UPPER EXTREMITY MMT:  MMT Right eval Left eval  Shoulder flexion 3 3-  Shoulder extension    Shoulder abduction 3 3-  Shoulder adduction    Shoulder extension    Shoulder internal rotation    Shoulder external rotation    Middle trapezius    Lower trapezius    Elbow flexion    Elbow extension    Wrist flexion    Wrist extension    Wrist ulnar deviation    Wrist radial deviation    Wrist pronation    Wrist supination    Grip strength     (Blank rows = not tested)    TREATMENT DATE:                                                                                                                              [facility] Adult PT Treatment:  DATE: 10/08/2023 Therapeutic Exercise: Pulley flexion x 2 mins Table slide flexion 10x10" Seated with noodle: Scap squeezes x10 Bent arm shoulder ER x 15 Backward shoulder rolls Gentle UT & LS stretches Chin tucks --> continues to produce symptoms down L arm Gentle thoracic extension (no noodle) Nerve glides: radial, median, ulnar Shoulder shrugs & dperession Manual Therapy: STM UT, LS, SCM, cervical paraspinals, SO   [facility] Adult PT Treatment:                                                DATE: 10/05/23 Therapeutic Exercise/Activity: Pulley flexion x 2 mins Table slide flexion 10 x 10 sec Seated with noodle: Scap squeeze Gentle UT and levator stretches Bilat ER x 15 Backward shoulder rolls Chin tuck - increases symptoms down arm Shoulder shrugs/depression 2 x 10 Gentle thoracic extension (no noodle) Standing shoulder flexion AROM with red PB slide x10 Manual Therapy: Gentle PROM Lt shoulder   [facility] Adult PT Treatment:                                                DATE: 09/30/2023 Therapeutic Exercise: Seated with noodle: Scap squeezes Chest lift + shoulder blade depression Gentle UT & LS stretches Shoulder ER --> increased pain in bilateral hands Chin tucks  2x10 Shoulder shrugs (did not tolerate addition of YTB) Bkwd shoulder circles Gentle thoracic extension over horizontal noodle to tolerance (no noodle) Cervical rotation & lateral flexion + gentle assist from therapist for end-range stretch (no noodle)    PATIENT EDUCATION:  Education details: PT POC and goals, HEP Person educated: Patient Education method: Medical illustrator Education comprehension: verbalized understanding and returned demonstration  HOME EXERCISE PROGRAM: Access Code: [email or web address] URL: [email or web address] Date: 10/05/2023 Prepared by: Reggy Eye  Exercises - Seated Cervical Retraction  - 1 x daily - 7 x weekly - 2 sets - 10 reps - Seated Scapular Retraction  - 1 x daily - 7 x weekly - 3 sets - 10 reps - Gentle Levator Scapulae Stretch  - 1 x daily - 7 x weekly - 1 sets - 10 reps - 10 seconds hold - Seated Upper Trapezius Stretch  - 1 x daily - 7 x weekly - 1 sets - 10 reps - 10 seconds hold - Seated Shoulder Shrug  - 1 x daily - 7 x weekly - 3 sets - 10 reps - Seated Shoulder Shrug Circles AROM Backward  - 1 x daily - 7 x weekly - 3 sets - 10 reps - Seated Bilateral Shoulder Flexion Towel Slide at Table Top  - 1 x daily - 7 x weekly - 1 sets - 10 reps - 10 seconds hold  ASSESSMENT:  CLINICAL IMPRESSION: Gentle cervical mobility exercises and stretches continued to patient's tolerance. Tenderness with palpitation along proximal upper trapezius on left side. Shoulder flexion slides performed in standing to progress patient with more functional activities.   OBJECTIVE IMPAIRMENTS: decreased activity tolerance, decreased ROM, decreased strength, increased muscle spasms, and pain.    GOALS: Goals reviewed with patient? Yes  SHORT TERM GOALS: Target date: 10/26/2023  Pt will be independent in initial HEP Baseline:  Goal status: INITIAL  2.  Pt will improve cervical rotation ROM by 10  degrees bilat Baseline:  Goal status:  INITIAL    LONG TERM GOALS: Target date: 11/23/2023  Pt will be independent with advanced HEP Baseline:  Goal status: INITIAL  2.  Pt will improve NDI to <= 22% to demo improved functional mobility Baseline:  42% Goal status: INITIAL  3.  Pt will report being able to wash dishes x 10 minutes with pain <= 2/10 Baseline:  Goal status: INITIAL  4.  Pt will be able to fully return to senior exercise class with pain < = 2/10 Baseline:  Goal status: INITIAL   PLAN:  PT FREQUENCY: 2x/week  PT DURATION: 8 weeks  PLANNED INTERVENTIONS: 97164- PT Re-evaluation, 97110-Therapeutic exercises, 97530- Therapeutic activity, 97112- Neuromuscular re-education, 97535- Self Care, 16109- Manual therapy, [phone]- Aquatic Therapy, Patient/Family education, Taping, Dry Needling, Cryotherapy, and Moist heat  PLAN FOR NEXT SESSION: Continue cervical mobility, postural strength   Sanjuana Mae, PTA 10/08/2023, 9:28 AM

## 2023-10-08 NOTE — Patient Outreach (Signed)
  Care Coordination   Follow Up Visit Note   10/08/2023 Name: Tanya Hogan MRN: [id number] DOB: 1951-02-08  Tanya Hogan is a 73 y.o. year old female who sees [name], Barbarann Ehlers, MD for primary care. I spoke with  [name] by phone today.  What matters to the patients health and wellness today?  HAA order     Goals Addressed             This Visit's Progress    Car Coordination Activities       Care Coordination Interventions: Patient stated that she was recently in the hospital in the last week of November and has been home and that the hospital provided her a lighter Magel that is better for her to use.  Patient stated that her brother is in her home but he has parkinson diease and not to much help to her and that  she could use a HHA to help her take baths and to assist her in the home. The SW will inbox  the PCP. Patient still in need of a HHA, Patient is going to call [facility] to see if they will cover one. Sw will contact the PCP to let her know that is willing to take an order for limited time for PT and a HHA if insurance will not cover.  The patient stated that she is getting the U-card for food and the patient does not have any other SDOH issues or concerns SW will follow up 3/3/20252025 at 10:30 am        SDOH assessments and interventions completed:  Yes  SDOH Interventions Today    Flowsheet Row Most Recent Value  SDOH Interventions   Food Insecurity Interventions Intervention Not Indicated  Housing Interventions Intervention Not Indicated  Transportation Interventions Intervention Not Indicated  Utilities Interventions Intervention Not Indicated        Care Coordination Interventions:  Yes, provided  Interventions Today    Flowsheet Row Most Recent Value  General Interventions   General Interventions Discussed/Reviewed General Interventions Reviewed, Community Resources  [facility] order was written by PCP, waiting for HHA to contact patient]         Follow up plan: Follow up call scheduled for 10/25/2023 at 11:00 am    Encounter Outcome:  Patient Visit Completed   Jeanie Cooks, PhD [facility], [facility] Social Worker Direct Dial: [phone]  Fax: [phone]

## 2023-10-08 NOTE — Patient Instructions (Signed)
Visit Information  Thank you for taking time to visit with me today. Please don't hesitate to contact me if I can be of assistance to you.   Following are the goals we discussed today:   Goals Addressed             This Visit's Progress    Car Coordination Activities   On track    Care Coordination Interventions: Patient stated that she was recently in the hospital in the last week of November and has been home and that the hospital provided her a lighter Sow that is better for her to use.  Patient stated that her brother is in her home but he has parkinson diease and not to much help to her and that  she could use a HHA to help her take baths and to assist her in the home. The SW will inbox  the PCP. Patient still in need of a HHA, Patient is going to call [facility] to see if they will cover one. Sw will contact the PCP to let her know that is willing to take an order for limited time for PT and a HHA if insurance will not cover.  The patient stated that she is getting the U-card for food and the patient does not have any other SDOH issues or concerns SW will follow up 3/3/20252025 at 10:30 am        Our next appointment is by telephone on 10/25/2023 at 10:30 am  Please call the care guide team at [phone] if you need to cancel or reschedule your appointment.   If you are experiencing a Mental Health or Behavioral Health Crisis or need someone to talk to, please call the Suicide and Crisis Lifeline: 988 go to [facility] Urgent [facility] [address] [place] [phone]) call 911  Patient verbalizes understanding of instructions and care plan provided today and agrees to view in MyChart. Active MyChart status and patient understanding of how to access instructions and care plan via MyChart confirmed with patient.     Jeanie Cooks, PhD [facility], [facility] Social Worker Direct Dial: [phone]  Fax:  [phone]

## 2023-10-11 DIAGNOSIS — M79672 Pain in left foot: Secondary | ICD-10-CM | POA: Diagnosis not present

## 2023-10-11 DIAGNOSIS — L602 Onychogryphosis: Secondary | ICD-10-CM | POA: Diagnosis not present

## 2023-10-11 DIAGNOSIS — L84 Corns and callosities: Secondary | ICD-10-CM | POA: Diagnosis not present

## 2023-10-11 DIAGNOSIS — M79671 Pain in right foot: Secondary | ICD-10-CM | POA: Diagnosis not present

## 2023-10-11 DIAGNOSIS — E119 Type 2 diabetes mellitus without complications: Secondary | ICD-10-CM | POA: Diagnosis not present

## 2023-10-12 ENCOUNTER — Ambulatory Visit: Payer: Self-pay

## 2023-10-12 ENCOUNTER — Encounter: Payer: Self-pay | Admitting: Physical Therapy

## 2023-10-12 ENCOUNTER — Ambulatory Visit: Payer: 59 | Admitting: Physical Therapy

## 2023-10-12 DIAGNOSIS — M542 Cervicalgia: Secondary | ICD-10-CM

## 2023-10-12 DIAGNOSIS — M62838 Other muscle spasm: Secondary | ICD-10-CM | POA: Diagnosis not present

## 2023-10-12 DIAGNOSIS — M5030 Other cervical disc degeneration, unspecified cervical region: Secondary | ICD-10-CM | POA: Diagnosis not present

## 2023-10-12 DIAGNOSIS — Z950 Presence of cardiac pacemaker: Secondary | ICD-10-CM | POA: Diagnosis not present

## 2023-10-12 DIAGNOSIS — M47892 Other spondylosis, cervical region: Secondary | ICD-10-CM | POA: Diagnosis not present

## 2023-10-12 DIAGNOSIS — R29898 Other symptoms and signs involving the musculoskeletal system: Secondary | ICD-10-CM

## 2023-10-12 NOTE — Therapy (Signed)
OUTPATIENT PHYSICAL THERAPY CERVICAL TREATMENT   Patient Name: Tanya Hogan MRN: [id number] DOB:[birth year], 73 y.o., female Today's Date: 10/12/2023  END OF SESSION:  PT End of Session - 10/12/23 1445     Visit Number 5    Number of Visits 16    Date for PT Re-Evaluation 11/23/23    Authorization Type [employer] Medicare    Authorization Time Period no auth per appt notes    Progress Note Due on Visit 10    PT Start Time 1446    PT Stop Time 1527    PT Time Calculation (min) 41 min    Activity Tolerance Patient tolerated treatment well              Past Medical History:  Diagnosis Date   Alkaline phosphatase elevation    Asthma    Breast cancer (HCC) 2011   Carpal tunnel syndrome    bilateral   Chronic HFrEF (heart failure with reduced ejection fraction) (HCC)    Cirrhosis, non-alcoholic (HCC)    Closed fracture of unspecified part of radius (alone)    DDD (degenerative disc disease), lumbar    Depression    pt denies   Diabetes mellitus without complication (HCC)    Enlarged heart    Fibromyalgia    GERD (gastroesophageal reflux disease)    History of kidney stones    HTN (hypertension)    Hypothyroidism    IBS (irritable bowel syndrome)    LBBB (left bundle branch block)    Microcytic anemia 05/14/2017   Mitral regurgitation    NICM (nonischemic cardiomyopathy) (HCC)    Pneumonia    Presence of permanent cardiac pacemaker    Pulmonary hypertension (HCC)    PVD (peripheral vascular disease) (HCC)    2019   Sleep apnea    uses cpap   Tricuspid regurgitation    Ventricular tachyarrhythmia [facility])    Past Surgical History:  Procedure Laterality Date   BIV PACEMAKER GENERATOR CHANGEOUT N/A 11/03/2021   Procedure: BIV PACEMAKER GENERATOR CHANGEOUT;  Surgeon: Marinus Maw, MD;  Location: [facility] INVASIVE CV LAB;  Service: Cardiovascular;  Laterality: N/A;   BREAST LUMPECTOMY Left 2011   COLONOSCOPY     FOOT SURGERY     KNEE SURGERY     LEAD REVISION/REPAIR N/A  11/03/2021   Procedure: LEAD REVISION/REPAIR;  Surgeon: Marinus Maw, MD;  Location: [facility];  Service: Cardiovascular;  Laterality: N/A;   MASTECTOMY Right    05/2019   PACEMAKER GENERATOR CHANGE N/A 08/23/2014   Procedure: PACEMAKER GENERATOR CHANGE;  Surgeon: Marinus Maw, MD;  Location: [facility] CATH LAB;  Service: Cardiovascular;  Laterality: N/A;   PACEMAKER PLACEMENT  2009   [place] [facility]   RECTAL SURGERY  1976   REDUCTION MAMMAPLASTY Left    05/2019   TOTAL SHOULDER ARTHROPLASTY Right 03/10/2018   TOTAL SHOULDER ARTHROPLASTY Right 03/10/2018   Procedure: RIGHT TOTAL SHOULDER ARTHROPLASTY;  Surgeon: Jones Broom, MD;  Location: [facility] OR;  Service: Orthopedics;  Laterality: Right;   TUBAL LIGATION     Patient Active Problem List   Diagnosis Date Noted   Presence of heart assist device (HCC) 10/07/2023   Primary osteoarthritis of left shoulder 09/08/2023   Injection site reaction 05/17/2023   Nonischemic cardiomyopathy (HCC) 02/16/2023   Class 3 obesity 10/30/2022   Acute on chronic systolic CHF (congestive heart failure) (HCC) 10/29/2022   Stress and adjustment reaction 04/08/2022   Gastroesophageal reflux disease 02/16/2022   AV block, 3rd  degree (HCC) 10/24/2021   COPD (chronic obstructive pulmonary disease) (HCC) 09/21/2021   Osteoporosis 02/28/2021   Primary osteoarthritis of both knees 01/14/2021   Irritable bowel syndrome with constipation 09/16/2020   Aortic atherosclerosis (HCC) 06/11/2020   Benign lipomatous neoplasm of skin and subcutaneous tissue of left leg 06/11/2020   NSVT (nonsustained ventricular tachycardia) (HCC) 02/14/2020   Chronic constipation 10/06/2019   Genetic testing 08/14/2019   Ductal carcinoma in situ (DCIS) of right breast 06/15/2019   Arthralgia 04/11/2019   Malignant tumor of breast (HCC) 03/06/2019   Fatigue 03/06/2019   DDD (degenerative disc disease), cervical 11/19/2017   Peripheral edema 05/19/2017   Microcytic anemia  05/14/2017   Delayed sleep phase syndrome 10/29/2016   Primary osteoarthritis of left ankle 09/24/2016   Gastrointestinal food sensitivity 05/21/2016   History of arthroplasty of right shoulder 02/24/2016   Bilateral foot pain 01/22/2016   NASH (nonalcoholic steatohepatitis) 05/26/2015    Class: Stage 3   Nasal septal perforation 07/11/2014   Controlled diabetes mellitus type 2 with complications (HCC) 05/25/2014   Physical deconditioning 04/24/2014   Obesity, Class II, BMI 35-39.9, with comorbidity 06/09/2013   OSA (obstructive sleep apnea) 06/08/2013   Chronic systolic heart failure (HCC) 05/23/2013   Asthma, moderate persistent 04/18/2013   Chronic cough 04/18/2013   Breast cancer of upper-inner quadrant of left female breast (HCC) 03/18/2010   Type 2 diabetes mellitus with hyperlipidemia (HCC) 11/07/2009   Fibromyalgia 10/29/2009   Biventricular cardiac pacemaker in situ 10/23/2009   Abnormal levels of other serum enzymes 10/13/2009   Hypothyroidism 10/03/2009   Essential hypertension 07/02/2009   Chronic back pain 07/02/2009    PCP: Linford Arnold   REFERRING PROVIDER: Benjamin Stain  REFERRING DIAG: cervical DDD  THERAPY DIAG:  Cervicalgia  Other symptoms and signs involving the musculoskeletal system  Rationale for Evaluation and Treatment: Rehabilitation  ONSET DATE: 09/10/23  SUBJECTIVE:                                                                                                                                                                                                         SUBJECTIVE STATEMENT: 10/12/2023 Pt states she is very fatigued today from appointments. She does note that her shoulder mobility is improving, able to hold arm overhead better. Also feels she is holding head up better, less headaches.     EVAL: Pt states she was sick for a while and then returned to her "active seniors" group. She was having difficulty lifting her Lt shoulder when she  returned and she saw MD. MD gave her  an injection in her Lt shoulder which she states caused her to have neck and Rt shoulder pain. The pain was so bad she had to go to ED who gave her pain medicine. She reports the pain has decreased some but her main complaint is lack of strength in her shoulders and her neck. She states her neck continues with pain with walking "heavy", prolonged standing with looking down. Pt uses a cervical collar that she states she uses very sparingly but it does help Hand dominance: Right  PERTINENT HISTORY:  Rt Total shoulder replacement Pacemaker History of breast cancer with mastectomy Bilateral carpal tunnel  PAIN:  Are you having pain? Yes: NPRS scale: 5-6 currently, 8/10 at worst Pain location: neck to bilat UEs Pain description: sharp Aggravating factors: prolonged standing with looking down, "jolting" Relieving factors: cervical collar  PRECAUTIONS: ICD/Pacemaker  RED FLAGS: None     WEIGHT BEARING RESTRICTIONS: No  FALLS:  Has patient fallen in last 6 months? Yes. Number of falls multiple - pt states when she has tachycardia she can fall    OCCUPATION: retired  PLOF: Independent  PATIENT GOALS: decrease pain, use her LT UE better  NEXT MD VISIT: 11/18/2023  OBJECTIVE:  Note: Objective measures were completed at Evaluation unless otherwise noted.  DIAGNOSTIC FINDINGS:  X ray performed but not read on Eval  PATIENT SURVEYS:  NDI 21/50 42%  COGNITION: Overall cognitive status: Within functional limits for tasks assessed  SENSATION: Pt reports she occasionally has numbness in her fingers bilat  POSTURE: rounded shoulders and forward head  PALPATION: Pt TTP throughout cervical musculature, very TTP on cervical spinous processes - unable to tolerate more than light touch with palpation   CERVICAL ROM:   Active ROM A/PROM (deg) eval  Flexion 20  Extension 15  Right lateral flexion 18  Left lateral flexion 11  Right rotation 40   Left rotation 30   (Blank rows = not tested)  UPPER EXTREMITY ROM:  Lt shoulder limited to < 90 degrees flexion and abduction  UPPER EXTREMITY MMT:  MMT Right eval Left eval  Shoulder flexion 3 3-  Shoulder extension    Shoulder abduction 3 3-  Shoulder adduction    Shoulder extension    Shoulder internal rotation    Shoulder external rotation    Middle trapezius    Lower trapezius    Elbow flexion    Elbow extension    Wrist flexion    Wrist extension    Wrist ulnar deviation    Wrist radial deviation    Wrist pronation    Wrist supination    Grip strength     (Blank rows = not tested)    TREATMENT DATE:                                                                                                                              [facility] Adult PT Treatment:  DATE: 10/12/23 Therapeutic Exercise: Seated scap retractions 2x8 w/ pool noodle Seated shoulder flexion AAROM w/ cane 2x8 Shoulder shrugs/depression x12 Manual Therapy: Seated; STM L UT, LS, rhomboid, infraspinatus, deltoid, cervical paraspinals; gentle trigger point release L LS    [facility] Adult PT Treatment:                                                DATE: 10/08/2023 Therapeutic Exercise: Pulley flexion x 2 mins Table slide flexion 10x10" Seated with noodle: Scap squeezes x10 Bent arm shoulder ER x 15 Backward shoulder rolls Gentle UT & LS stretches Chin tucks --> continues to produce symptoms down L arm Gentle thoracic extension (no noodle) Nerve glides: radial, median, ulnar Shoulder shrugs & dperession Manual Therapy: STM UT, LS, SCM, cervical paraspinals, SO   [facility] Adult PT Treatment:                                                DATE: 10/05/23 Therapeutic Exercise/Activity: Pulley flexion x 2 mins Table slide flexion 10 x 10 sec Seated with noodle: Scap squeeze Gentle UT and levator stretches Bilat ER x 15 Backward shoulder rolls Chin tuck -  increases symptoms down arm Shoulder shrugs/depression 2 x 10 Gentle thoracic extension (no noodle) Standing shoulder flexion AROM with red PB slide x10 Manual Therapy: Gentle PROM Lt shoulder   [facility] Adult PT Treatment:                                                DATE: 09/30/2023 Therapeutic Exercise: Seated with noodle: Scap squeezes Chest lift + shoulder blade depression Gentle UT & LS stretches Shoulder ER --> increased pain in bilateral hands Chin tucks 2x10 Shoulder shrugs (did not tolerate addition of YTB) Bkwd shoulder circles Gentle thoracic extension over horizontal noodle to tolerance (no noodle) Cervical rotation & lateral flexion + gentle assist from therapist for end-range stretch (no noodle)    PATIENT EDUCATION:  Education details: rationale for interventions, HEP  Person educated: Patient Education method: Explanation, Demonstration, Tactile cues, Verbal cues Education comprehension: verbalized understanding, returned demonstration, verbal cues required, tactile cues required, and needs further education     HOME EXERCISE PROGRAM: Access Code: [email or web address] URL: [email or web address] Date: 10/05/2023 Prepared by: Reggy Eye  Exercises - Seated Cervical Retraction  - 1 x daily - 7 x weekly - 2 sets - 10 reps - Seated Scapular Retraction  - 1 x daily - 7 x weekly - 3 sets - 10 reps - Gentle Levator Scapulae Stretch  - 1 x daily - 7 x weekly - 1 sets - 10 reps - 10 seconds hold - Seated Upper Trapezius Stretch  - 1 x daily - 7 x weekly - 1 sets - 10 reps - 10 seconds hold - Seated Shoulder Shrug  - 1 x daily - 7 x weekly - 3 sets - 10 reps - Seated Shoulder Shrug Circles AROM Backward  - 1 x daily - 7 x weekly - 3 sets - 10 reps - Seated Bilateral Shoulder Flexion Towel Slide at Table Top  - 1 x daily -  7 x weekly - 1 sets - 10 reps - 10 seconds hold  ASSESSMENT:  CLINICAL IMPRESSION: Pt arrives w/ baseline pain - endorses improving  cervical/GH mobility but significant fatigue from busy day today. Given this, increased time spent w/ manual to improve tissue extensibility, noted L LS trigger point that improves w/ release. Able to progress seated AAROM for GH mobility, emphasis on posture and comfortable ROM. Pt reports improved symptoms and fatigue on departure, no adverse events. Recommend continuing along current POC in order to address relevant deficits and improve functional tolerance. Pt departs today's session in no acute distress, all voiced questions/concerns addressed appropriately from PT perspective.     OBJECTIVE IMPAIRMENTS: decreased activity tolerance, decreased ROM, decreased strength, increased muscle spasms, and pain.    GOALS: Goals reviewed with patient? Yes  SHORT TERM GOALS: Target date: 10/26/2023  Pt will be independent in initial HEP Baseline:  Goal status: INITIAL  2.  Pt will improve cervical rotation ROM by 10 degrees bilat Baseline:  Goal status: INITIAL    LONG TERM GOALS: Target date: 11/23/2023  Pt will be independent with advanced HEP Baseline:  Goal status: INITIAL  2.  Pt will improve NDI to <= 22% to demo improved functional mobility Baseline:  42% Goal status: INITIAL  3.  Pt will report being able to wash dishes x 10 minutes with pain <= 2/10 Baseline:  Goal status: INITIAL  4.  Pt will be able to fully return to senior exercise class with pain < = 2/10 Baseline:  Goal status: INITIAL   PLAN:  PT FREQUENCY: 2x/week  PT DURATION: 8 weeks  PLANNED INTERVENTIONS: 97164- PT Re-evaluation, 97110-Therapeutic exercises, 97530- Therapeutic activity, 97112- Neuromuscular re-education, 97535- Self Care, 40981- Manual therapy, [phone]- Aquatic Therapy, Patient/Family education, Taping, Dry Needling, Cryotherapy, and Moist heat  PLAN FOR NEXT SESSION: Continue cervical mobility, postural strength   Ashley Murrain PT, DPT 10/12/2023 3:32 PM

## 2023-10-12 NOTE — Patient Instructions (Signed)
Visit Information  Thank you for taking time to visit with me today. Please don't hesitate to contact me if I can be of assistance to you.   Following are the goals we discussed today:  Follow up with your pharmacist regarding Refaximin. Notify your provider if you are unable to get. Also notify RN Care Manager if you are still unable to obtain. Continue to take medications as prescribed. Continue to attend provider visits as scheduled Continue to eat healthy, lean meats, vegetables, fruits, avoid saturated and transfats Contact provider with health questions or concerns as needed   The care guide will call you to schedule next follow up telephone visit with RN Case Manager  If you are experiencing a Mental Health or Behavioral Health Crisis or need someone to talk to, please call the Suicide and Crisis Lifeline: 988 call the [place] National Suicide Prevention Lifeline: (763) 887-2754 or TTY: (226)162-6085 TTY (612)848-5253) to talk to a trained counselor  [name], RN, MSN, BSN, CCM [place]  [facility], Population Health Case Manager Phone: [phone]

## 2023-10-12 NOTE — Patient Outreach (Signed)
  Care Coordination   Follow Up Visit Note   10/12/2023 Name: Tanya Hogan MRN: [id number] DOB: 07/11/1951  Tanya Hogan is a 73 y.o. year old female who sees [name], Barbarann Ehlers, MD for primary care. I spoke with  Tanya Hogan by phone today.  What matters to the patients health and wellness today?  Tanya Hogan reports she does not have much time to talk. Urgent care visit on 10/04/23 with lower abdominal pain. Follow up with PCP completed on 10/07/23 and Gastroenterology, Dr. Genevieve Norlander, visit on 10/06/23.  She reports that she is awaiting rifaximin to come in to her pharmacy. She states she has received the Neomycin(Both prescribed by GI for continued problems with constipation and bloating. Tanya Hogan reports she is awaiting a call back from [facility] and will follow up with them on tomorrow if she does not hear anything. She is going to outpatient rehab at this time.   Goals Addressed             This Visit's Progress    Assist with health management       Interventions Today    Flowsheet Row Most Recent Value  Chronic Disease   Chronic disease during today's visit Other  [chronic constipation]  General Interventions   General Interventions Discussed/Reviewed General Interventions Reviewed  [Evaluation of current treatment plan for health condition and patient's adherence to plan.]  Pharmacy Interventions   Pharmacy Dicussed/Reviewed Pharmacy Topics Discussed  [discussed [facility] as alternative pharmacy. RNCM contacted [facility] who states they do not have Rifaximin in stock and will have order if it available to get it in. VM left on patient's voice message with contact number.]            SDOH assessments and interventions completed:  No  Care Coordination Interventions:  Yes, provided   Follow up plan:  RNCM will continue to follow.    Encounter Outcome:  Patient Visit Completed   [name], RN, MSN, BSN, CCM [place]   [facility], Population Health Case Manager Phone: [phone]

## 2023-10-15 ENCOUNTER — Ambulatory Visit: Payer: 59

## 2023-10-15 DIAGNOSIS — Z950 Presence of cardiac pacemaker: Secondary | ICD-10-CM | POA: Diagnosis not present

## 2023-10-15 DIAGNOSIS — E118 Type 2 diabetes mellitus with unspecified complications: Secondary | ICD-10-CM | POA: Diagnosis not present

## 2023-10-15 DIAGNOSIS — M62838 Other muscle spasm: Secondary | ICD-10-CM | POA: Diagnosis not present

## 2023-10-15 DIAGNOSIS — M542 Cervicalgia: Secondary | ICD-10-CM

## 2023-10-15 DIAGNOSIS — R29898 Other symptoms and signs involving the musculoskeletal system: Secondary | ICD-10-CM

## 2023-10-15 DIAGNOSIS — M47892 Other spondylosis, cervical region: Secondary | ICD-10-CM | POA: Diagnosis not present

## 2023-10-15 DIAGNOSIS — M5030 Other cervical disc degeneration, unspecified cervical region: Secondary | ICD-10-CM | POA: Diagnosis not present

## 2023-10-15 NOTE — Therapy (Signed)
OUTPATIENT PHYSICAL THERAPY CERVICAL TREATMENT   Patient Name: Tanya Hogan MRN: [id number] DOB:01-Mar-1951, 73 y.o., female Today's Date: 10/15/2023  END OF SESSION:  PT End of Session - 10/15/23 0846     Visit Number 6    Number of Visits 16    Date for PT Re-Evaluation 11/23/23    Authorization Type [employer] Medicare    Authorization Time Period no auth per appt notes    Progress Note Due on Visit 10    PT Start Time 0846    PT Stop Time 0933    PT Time Calculation (min) 47 min    Activity Tolerance Patient tolerated treatment well    Behavior During Therapy [facility] for tasks assessed/performed            Past Medical History:  Diagnosis Date   Alkaline phosphatase elevation    Asthma    Breast cancer (HCC) 2011   Carpal tunnel syndrome    bilateral   Chronic HFrEF (heart failure with reduced ejection fraction) (HCC)    Cirrhosis, non-alcoholic (HCC)    Closed fracture of unspecified part of radius (alone)    DDD (degenerative disc disease), lumbar    Depression    pt denies   Diabetes mellitus without complication (HCC)    Enlarged heart    Fibromyalgia    GERD (gastroesophageal reflux disease)    History of kidney stones    HTN (hypertension)    Hypothyroidism    IBS (irritable bowel syndrome)    LBBB (left bundle branch block)    Microcytic anemia 05/14/2017   Mitral regurgitation    NICM (nonischemic cardiomyopathy) (HCC)    Pneumonia    Presence of permanent cardiac pacemaker    Pulmonary hypertension (HCC)    PVD (peripheral vascular disease) (HCC)    2019   Sleep apnea    uses cpap   Tricuspid regurgitation    Ventricular tachyarrhythmia [facility])    Past Surgical History:  Procedure Laterality Date   BIV PACEMAKER GENERATOR CHANGEOUT N/A 11/03/2021   Procedure: BIV PACEMAKER GENERATOR CHANGEOUT;  Surgeon: Marinus Maw, MD;  Location: [facility] INVASIVE CV LAB;  Service: Cardiovascular;  Laterality: N/A;   BREAST LUMPECTOMY Left 2011   COLONOSCOPY     FOOT  SURGERY     KNEE SURGERY     LEAD REVISION/REPAIR N/A 11/03/2021   Procedure: LEAD REVISION/REPAIR;  Surgeon: Marinus Maw, MD;  Location: [facility] INVASIVE CV LAB;  Service: Cardiovascular;  Laterality: N/A;   MASTECTOMY Right    05/2019   PACEMAKER GENERATOR CHANGE N/A 08/23/2014   Procedure: PACEMAKER GENERATOR CHANGE;  Surgeon: Marinus Maw, MD;  Location: [facility] CATH LAB;  Service: Cardiovascular;  Laterality: N/A;   PACEMAKER PLACEMENT  2009   [place] [facility]   RECTAL SURGERY  1976   REDUCTION MAMMAPLASTY Left    05/2019   TOTAL SHOULDER ARTHROPLASTY Right 03/10/2018   TOTAL SHOULDER ARTHROPLASTY Right 03/10/2018   Procedure: RIGHT TOTAL SHOULDER ARTHROPLASTY;  Surgeon: Jones Broom, MD;  Location: [facility] OR;  Service: Orthopedics;  Laterality: Right;   TUBAL LIGATION     Patient Active Problem List   Diagnosis Date Noted   Presence of heart assist device (HCC) 10/07/2023   Primary osteoarthritis of left shoulder 09/08/2023   Injection site reaction 05/17/2023   Nonischemic cardiomyopathy (HCC) 02/16/2023   Class 3 obesity 10/30/2022   Acute on chronic systolic CHF (congestive heart failure) (HCC) 10/29/2022   Stress and adjustment reaction 04/08/2022   Gastroesophageal  reflux disease 02/16/2022   AV block, 3rd degree (HCC) 10/24/2021   COPD (chronic obstructive pulmonary disease) (HCC) 09/21/2021   Osteoporosis 02/28/2021   Primary osteoarthritis of both knees 01/14/2021   Irritable bowel syndrome with constipation 09/16/2020   Aortic atherosclerosis (HCC) 06/11/2020   Benign lipomatous neoplasm of skin and subcutaneous tissue of left leg 06/11/2020   NSVT (nonsustained ventricular tachycardia) (HCC) 02/14/2020   Chronic constipation 10/06/2019   Genetic testing 08/14/2019   Ductal carcinoma in situ (DCIS) of right breast 06/15/2019   Arthralgia 04/11/2019   Malignant tumor of breast (HCC) 03/06/2019   Fatigue 03/06/2019   DDD (degenerative disc disease), cervical  11/19/2017   Peripheral edema 05/19/2017   Microcytic anemia 05/14/2017   Delayed sleep phase syndrome 10/29/2016   Primary osteoarthritis of left ankle 09/24/2016   Gastrointestinal food sensitivity 05/21/2016   History of arthroplasty of right shoulder 02/24/2016   Bilateral foot pain 01/22/2016   NASH (nonalcoholic steatohepatitis) 05/26/2015    Class: Stage 3   Nasal septal perforation 07/11/2014   Controlled diabetes mellitus type 2 with complications (HCC) 05/25/2014   Physical deconditioning 04/24/2014   Obesity, Class II, BMI 35-39.9, with comorbidity 06/09/2013   OSA (obstructive sleep apnea) 06/08/2013   Chronic systolic heart failure (HCC) 05/23/2013   Asthma, moderate persistent 04/18/2013   Chronic cough 04/18/2013   Breast cancer of upper-inner quadrant of left female breast (HCC) 03/18/2010   Type 2 diabetes mellitus with hyperlipidemia (HCC) 11/07/2009   Fibromyalgia 10/29/2009   Biventricular cardiac pacemaker in situ 10/23/2009   Abnormal levels of other serum enzymes 10/13/2009   Hypothyroidism 10/03/2009   Essential hypertension 07/02/2009   Chronic back pain 07/02/2009    PCP: Linford Arnold   REFERRING PROVIDER: Benjamin Stain  REFERRING DIAG: cervical DDD  THERAPY DIAG:  Cervicalgia  Other symptoms and signs involving the musculoskeletal system  Rationale for Evaluation and Treatment: Rehabilitation  ONSET DATE: 09/10/23  SUBJECTIVE:                                                                                                                                                                                                         SUBJECTIVE STATEMENT:  Patient reports her neck feels better but 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 Patient states she returned to exercise class and had to modify some of the arm raise exercises but had a good time.   EVAL: Pt states she was sick for a while and then returned to her "active  seniors" group. She was having difficulty lifting  her Lt shoulder when she returned and she saw MD. MD gave her an injection in her Lt shoulder which she states caused her to have neck and Rt shoulder pain. The pain was so bad she had to go to ED who gave her pain medicine. She reports the pain has decreased some but her main complaint is lack of strength in her shoulders and her neck. She states her neck continues with pain with walking "heavy", prolonged standing with looking down. Pt uses a cervical collar that she states she uses very sparingly but it does help Hand dominance: Right  PERTINENT HISTORY:  Rt Total shoulder replacement Pacemaker History of breast cancer with mastectomy Bilateral carpal tunnel  PAIN:  Are you having pain? Yes: NPRS scale: 5-6 currently, 8/10 at worst Pain location: neck to bilat UEs Pain description: sharp Aggravating factors: prolonged standing with looking down, "jolting" Relieving factors: cervical collar  PRECAUTIONS: ICD/Pacemaker  RED FLAGS: None     WEIGHT BEARING RESTRICTIONS: No  FALLS:  Has patient fallen in last 6 months? Yes. Number of falls multiple - pt states when she has tachycardia she can fall    OCCUPATION: retired  PLOF: Independent  PATIENT GOALS: decrease pain, use her LT UE better  NEXT MD VISIT: 11/18/2023  OBJECTIVE:  Note: Objective measures were completed at Evaluation unless otherwise noted.  DIAGNOSTIC FINDINGS:  X ray performed but not read on Eval  PATIENT SURVEYS:  NDI 21/50 42%  COGNITION: Overall cognitive status: Within functional limits for tasks assessed  SENSATION: Pt reports she occasionally has numbness in her fingers bilat  POSTURE: rounded shoulders and forward head  PALPATION: Pt TTP throughout cervical musculature, very TTP on cervical spinous processes - unable to tolerate more than light touch with palpation   CERVICAL ROM:   Active ROM A/PROM (deg) eval  Flexion 20  Extension  15  Right lateral flexion 18  Left lateral flexion 11  Right rotation 40  Left rotation 30   (Blank rows = not tested)  UPPER EXTREMITY ROM:  Lt shoulder limited to < 90 degrees flexion and abduction  UPPER EXTREMITY MMT:  MMT Right eval Left eval  Shoulder flexion 3 3-  Shoulder extension    Shoulder abduction 3 3-  Shoulder adduction    Shoulder extension    Shoulder internal rotation    Shoulder external rotation    Middle trapezius    Lower trapezius    Elbow flexion    Elbow extension    Wrist flexion    Wrist extension    Wrist ulnar deviation    Wrist radial deviation    Wrist pronation    Wrist supination    Grip strength     (Blank rows = not tested)    TREATMENT DATE:  [facility] Adult PT Treatment:                                                DATE: 10/15/2023 Therapeutic Exercise: Seated passive pec stretch (L) Standing bicep stretch holding edge of table 10x5" (L) Seated + noodle at back: Scap retractions 2x10 Shoulder flexion AAROM with cane 2x10 Shoulder shrugs/depression x10 Cervical AROM all directions x10 each Chin tucks x10 Manual Therapy: Seated: STM L UT, LS, rhomboid, infraspinatus, deltoid, cervical paraspinals; gentle trigger point release L LS    [facility] Adult PT Treatment:                                                DATE: 10/12/23 Therapeutic Exercise: Seated scap retractions 2x8 w/ pool noodle Seated shoulder flexion AAROM w/ cane 2x8 Shoulder shrugs/depression x12 Manual Therapy: Seated; STM L UT, LS, rhomboid, infraspinatus, deltoid, cervical paraspinals; gentle trigger point release L LS    [facility] Adult PT Treatment:                                                DATE: 10/08/2023 Therapeutic Exercise: Pulley flexion x 2 mins Table slide flexion 10x10" Seated with noodle: Scap squeezes x10 Bent arm shoulder  ER x 15 Backward shoulder rolls Gentle UT & LS stretches Chin tucks --> continues to produce symptoms down L arm Gentle thoracic extension (no noodle) Nerve glides: radial, median, ulnar Shoulder shrugs & dperession Manual Therapy: STM UT, LS, SCM, cervical paraspinals, SO   PATIENT EDUCATION:  Education details: rationale for interventions, HEP  Person educated: Patient Education method: Explanation, Demonstration, Tactile cues, Verbal cues Education comprehension: verbalized understanding, returned demonstration, verbal cues required, tactile cues required, and needs further education     HOME EXERCISE PROGRAM: Access Code: [email or web address] URL: [email or web address] Date: 10/05/2023 Prepared by: Reggy Eye  Exercises - Seated Cervical Retraction  - 1 x daily - 7 x weekly - 2 sets - 10 reps - Seated Scapular Retraction  - 1 x daily - 7 x weekly - 3 sets - 10 reps - Gentle Levator Scapulae Stretch  - 1 x daily - 7 x weekly - 1 sets - 10 reps - 10 seconds hold - Seated Upper Trapezius Stretch  - 1 x daily - 7 x weekly - 1 sets - 10 reps - 10 seconds hold - Seated Shoulder Shrug  - 1 x daily - 7 x weekly - 3 sets - 10 reps - Seated Shoulder Shrug Circles AROM Backward  - 1 x daily - 7 x weekly - 3 sets - 10 reps - Seated Bilateral Shoulder Flexion Towel Slide at Table Top  - 1 x daily - 7 x weekly - 1 sets - 10 reps - 10 seconds hold  ASSESSMENT:  CLINICAL IMPRESSION: Improved tolerance with cervical retraction exercise demonstrated with decreased radiating symptoms down L UE. Trigger points noted around infraspinatus with palpitation; sensitivity decreased over time with manual treatment. Improved cervical active range of motion demonstrated in all directions.   OBJECTIVE IMPAIRMENTS: decreased activity tolerance, decreased ROM, decreased strength, increased muscle spasms, and  pain.    GOALS: Goals reviewed with patient? Yes  SHORT TERM GOALS: Target date:  10/26/2023  Pt will be independent in initial HEP Baseline:  Goal status: INITIAL  2.  Pt will improve cervical rotation ROM by 10 degrees bilat Baseline:  Goal status: INITIAL    LONG TERM GOALS: Target date: 11/23/2023  Pt will be independent with advanced HEP Baseline:  Goal status: INITIAL  2.  Pt will improve NDI to <= 22% to demo improved functional mobility Baseline:  42% Goal status: INITIAL  3.  Pt will report being able to wash dishes x 10 minutes with pain <= 2/10 Baseline:  Goal status: INITIAL  4.  Pt will be able to fully return to senior exercise class with pain < = 2/10 Baseline:  Goal status: INITIAL   PLAN:  PT FREQUENCY: 2x/week  PT DURATION: 8 weeks  PLANNED INTERVENTIONS: 97164- PT Re-evaluation, 97110-Therapeutic exercises, 97530- Therapeutic activity, 97112- Neuromuscular re-education, 97535- Self Care, 16109- Manual therapy, [phone]- Aquatic Therapy, Patient/Family education, Taping, Dry Needling, Cryotherapy, and Moist heat  PLAN FOR NEXT SESSION: Continue cervical mobility, postural strength   Carlynn Herald, PTA 10/15/2023 9:33 AM

## 2023-10-16 ENCOUNTER — Other Ambulatory Visit: Payer: Self-pay | Admitting: Family Medicine

## 2023-10-16 DIAGNOSIS — E038 Other specified hypothyroidism: Secondary | ICD-10-CM

## 2023-10-18 ENCOUNTER — Ambulatory Visit: Payer: 59

## 2023-10-18 DIAGNOSIS — M47892 Other spondylosis, cervical region: Secondary | ICD-10-CM | POA: Diagnosis not present

## 2023-10-18 DIAGNOSIS — M542 Cervicalgia: Secondary | ICD-10-CM

## 2023-10-18 DIAGNOSIS — Z950 Presence of cardiac pacemaker: Secondary | ICD-10-CM | POA: Diagnosis not present

## 2023-10-18 DIAGNOSIS — M62838 Other muscle spasm: Secondary | ICD-10-CM | POA: Diagnosis not present

## 2023-10-18 DIAGNOSIS — R29898 Other symptoms and signs involving the musculoskeletal system: Secondary | ICD-10-CM

## 2023-10-18 DIAGNOSIS — M5030 Other cervical disc degeneration, unspecified cervical region: Secondary | ICD-10-CM | POA: Diagnosis not present

## 2023-10-18 NOTE — Therapy (Signed)
 OUTPATIENT PHYSICAL THERAPY CERVICAL TREATMENT   Patient Name: Tanya Hogan MRN: [id number] DOB:[birth year], 73 y.o., female Today's Date: 10/18/2023  END OF SESSION:  PT End of Session - 10/18/23 0849     Visit Number 7    Number of Visits 16    Date for PT Re-Evaluation 11/23/23    Authorization Type [employer] Medicare    Authorization Time Period no auth per appt notes    Progress Note Due on Visit 10    PT Start Time [phone]    PT Stop Time 0930    PT Time Calculation (min) 43 min    Activity Tolerance Patient tolerated treatment well    Behavior During Therapy [facility] for tasks assessed/performed            Past Medical History:  Diagnosis Date   Alkaline phosphatase elevation    Asthma    Breast cancer (HCC) 2011   Carpal tunnel syndrome    bilateral   Chronic HFrEF (heart failure with reduced ejection fraction) (HCC)    Cirrhosis, non-alcoholic (HCC)    Closed fracture of unspecified part of radius (alone)    DDD (degenerative disc disease), lumbar    Depression    pt denies   Diabetes mellitus without complication (HCC)    Enlarged heart    Fibromyalgia    GERD (gastroesophageal reflux disease)    History of kidney stones    HTN (hypertension)    Hypothyroidism    IBS (irritable bowel syndrome)    LBBB (left bundle branch block)    Microcytic anemia 05/14/2017   Mitral regurgitation    NICM (nonischemic cardiomyopathy) (HCC)    Pneumonia    Presence of permanent cardiac pacemaker    Pulmonary hypertension (HCC)    PVD (peripheral vascular disease) (HCC)    2019   Sleep apnea    uses cpap   Tricuspid regurgitation    Ventricular tachyarrhythmia [facility])    Past Surgical History:  Procedure Laterality Date   BIV PACEMAKER GENERATOR CHANGEOUT N/A 11/03/2021   Procedure: BIV PACEMAKER GENERATOR CHANGEOUT;  Surgeon: Marinus Maw, MD;  Location: [facility] INVASIVE CV LAB;  Service: Cardiovascular;  Laterality: N/A;   BREAST LUMPECTOMY Left 2011   COLONOSCOPY     FOOT  SURGERY     KNEE SURGERY     LEAD REVISION/REPAIR N/A 11/03/2021   Procedure: LEAD REVISION/REPAIR;  Surgeon: Marinus Maw, MD;  Location: [facility] INVASIVE CV LAB;  Service: Cardiovascular;  Laterality: N/A;   MASTECTOMY Right    05/2019   PACEMAKER GENERATOR CHANGE N/A 08/23/2014   Procedure: PACEMAKER GENERATOR CHANGE;  Surgeon: Marinus Maw, MD;  Location: [facility] CATH LAB;  Service: Cardiovascular;  Laterality: N/A;   PACEMAKER PLACEMENT  2009   [place] [facility]   RECTAL SURGERY  1976   REDUCTION MAMMAPLASTY Left    05/2019   TOTAL SHOULDER ARTHROPLASTY Right 03/10/2018   TOTAL SHOULDER ARTHROPLASTY Right 03/10/2018   Procedure: RIGHT TOTAL SHOULDER ARTHROPLASTY;  Surgeon: Jones Broom, MD;  Location: [facility] OR;  Service: Orthopedics;  Laterality: Right;   TUBAL LIGATION     Patient Active Problem List   Diagnosis Date Noted   Presence of heart assist device (HCC) 10/07/2023   Primary osteoarthritis of left shoulder 09/08/2023   Injection site reaction 05/17/2023   Nonischemic cardiomyopathy (HCC) 02/16/2023   Class 3 obesity 10/30/2022   Acute on chronic systolic CHF (congestive heart failure) (HCC) 10/29/2022   Stress and adjustment reaction 04/08/2022   Gastroesophageal  reflux disease 02/16/2022   AV block, 3rd degree (HCC) 10/24/2021   COPD (chronic obstructive pulmonary disease) (HCC) 09/21/2021   Osteoporosis 02/28/2021   Primary osteoarthritis of both knees 01/14/2021   Irritable bowel syndrome with constipation 09/16/2020   Aortic atherosclerosis (HCC) 06/11/2020   Benign lipomatous neoplasm of skin and subcutaneous tissue of left leg 06/11/2020   NSVT (nonsustained ventricular tachycardia) (HCC) 02/14/2020   Chronic constipation 10/06/2019   Genetic testing 08/14/2019   Ductal carcinoma in situ (DCIS) of right breast 06/15/2019   Arthralgia 04/11/2019   Malignant tumor of breast (HCC) 03/06/2019   Fatigue 03/06/2019   DDD (degenerative disc disease), cervical  11/19/2017   Peripheral edema 05/19/2017   Microcytic anemia 05/14/2017   Delayed sleep phase syndrome 10/29/2016   Primary osteoarthritis of left ankle 09/24/2016   Gastrointestinal food sensitivity 05/21/2016   History of arthroplasty of right shoulder 02/24/2016   Bilateral foot pain 01/22/2016   NASH (nonalcoholic steatohepatitis) 05/26/2015    Class: Stage 3   Nasal septal perforation 07/11/2014   Controlled diabetes mellitus type 2 with complications (HCC) 05/25/2014   Physical deconditioning 04/24/2014   Obesity, Class II, BMI 35-39.9, with comorbidity 06/09/2013   OSA (obstructive sleep apnea) 06/08/2013   Chronic systolic heart failure (HCC) 05/23/2013   Asthma, moderate persistent 04/18/2013   Chronic cough 04/18/2013   Breast cancer of upper-inner quadrant of left female breast (HCC) 03/18/2010   Type 2 diabetes mellitus with hyperlipidemia (HCC) 11/07/2009   Fibromyalgia 10/29/2009   Biventricular cardiac pacemaker in situ 10/23/2009   Abnormal levels of other serum enzymes 10/13/2009   Hypothyroidism 10/03/2009   Essential hypertension 07/02/2009   Chronic back pain 07/02/2009    PCP: Linford Arnold   REFERRING PROVIDER: Benjamin Stain  REFERRING DIAG: cervical DDD  THERAPY DIAG:  Cervicalgia  Other symptoms and signs involving the musculoskeletal system  Rationale for Evaluation and Treatment: Rehabilitation  ONSET DATE: 09/10/23  SUBJECTIVE:                                                                                                                                                                                                         SUBJECTIVE STATEMENT:  Patient reports she felt very good after last PT session, states she is able to lift L arm higher and hold it up for longer. Patient states she has a "few tender spots" at neck and low back is sore from "sitting in the wrong place".   EVAL: Pt states she was sick for a while and then returned to her "active  seniors" group.  She was having difficulty lifting her Lt shoulder when she returned and she saw MD. MD gave her an injection in her Lt shoulder which she states caused her to have neck and Rt shoulder pain. The pain was so bad she had to go to ED who gave her pain medicine. She reports the pain has decreased some but her main complaint is lack of strength in her shoulders and her neck. She states her neck continues with pain with walking "heavy", prolonged standing with looking down. Pt uses a cervical collar that she states she uses very sparingly but it does help Hand dominance: Right  PERTINENT HISTORY:  Rt Total shoulder replacement Pacemaker History of breast cancer with mastectomy Bilateral carpal tunnel  PAIN:  Are you having pain? Yes: NPRS scale: 5-6 currently, 8/10 at worst Pain location: neck to bilat UEs Pain description: sharp Aggravating factors: prolonged standing with looking down, "jolting" Relieving factors: cervical collar  PRECAUTIONS: ICD/Pacemaker  RED FLAGS: None     WEIGHT BEARING RESTRICTIONS: No  FALLS:  Has patient fallen in last 6 months? Yes. Number of falls multiple - pt states when she has tachycardia she can fall   OCCUPATION: retired  PLOF: Independent  PATIENT GOALS: decrease pain, use her LT UE better  NEXT MD VISIT: 11/18/2023  OBJECTIVE:  Note: Objective measures were completed at Evaluation unless otherwise noted.  DIAGNOSTIC FINDINGS:  X ray performed but not read on Eval  PATIENT SURVEYS:  NDI 21/50 42%  COGNITION: Overall cognitive status: Within functional limits for tasks assessed  SENSATION: Pt reports she occasionally has numbness in her fingers bilat  POSTURE: rounded shoulders and forward head  PALPATION: Pt TTP throughout cervical musculature, very TTP on cervical spinous processes - unable to tolerate more than light touch with palpation   CERVICAL ROM:   Active ROM A/PROM (deg) eval  Flexion 20  Extension 15   Right lateral flexion 18  Left lateral flexion 11  Right rotation 40  Left rotation 30   (Blank rows = not tested)  UPPER EXTREMITY ROM:  Lt shoulder limited to < 90 degrees flexion and abduction  UPPER EXTREMITY MMT:  MMT Right eval Left eval  Shoulder flexion 3 3-  Shoulder extension    Shoulder abduction 3 3-  Shoulder adduction    Shoulder extension    Shoulder internal rotation    Shoulder external rotation    Middle trapezius    Lower trapezius    Elbow flexion    Elbow extension    Wrist flexion    Wrist extension    Wrist ulnar deviation    Wrist radial deviation    Wrist pronation    Wrist supination    Grip strength     (Blank rows = not tested)    TREATMENT DATE:  [facility] Adult PT Treatment:                                                DATE: 10/18/2023 Therapeutic Exercise: Seated: Red PB roll out for shoulder flexion & thoracolumbar stretch Shoulder pulleys: flexion & scaption x 2 min each Seated with noodle:  Bkwd shoulder circles Shoulder shrugs/depression holding 1#DB x10 Bicep curls + 1#DB Shoulder flexion (thigh to 90*) 2x5 (alt UE) Standing: wall slides with towel --> flexion, scaption, abduction Seated resisted rows + RTB Manual Therapy: Seated: STM L UT, LS, rhomboid, infraspinatus, deltoid, cervical paraspinals; gentle trigger point release L LS    [facility] Adult PT Treatment:                                                DATE: 10/15/2023 Therapeutic Exercise: Seated passive pec stretch (L) Standing bicep stretch holding edge of table 10x5" (L) Seated + noodle at back: Scap retractions 2x10 Shoulder flexion AAROM with cane 2x10 Shoulder shrugs/depression x10 Cervical AROM all directions x10 each Chin tucks x10 Manual Therapy: Seated: STM L UT, LS, rhomboid, infraspinatus, deltoid, cervical paraspinals; gentle  trigger point release L LS    [facility] Adult PT Treatment:                                                DATE: 10/12/23 Therapeutic Exercise: Seated scap retractions 2x8 w/ pool noodle Seated shoulder flexion AAROM w/ cane 2x8 Shoulder shrugs/depression x12 Manual Therapy: Seated; STM L UT, LS, rhomboid, infraspinatus, deltoid, cervical paraspinals; gentle trigger point release L LS    [facility] Adult PT Treatment:                                                DATE: 10/08/2023 Therapeutic Exercise: Pulley flexion x 2 mins Table slide flexion 10x10" Seated with noodle: Scap squeezes x10 Bent arm shoulder ER x 15 Backward shoulder rolls Gentle UT & LS stretches Chin tucks --> continues to produce symptoms down L arm Gentle thoracic extension (no noodle) Nerve glides: radial, median, ulnar Shoulder shrugs & dperession Manual Therapy: STM UT, LS, SCM, cervical paraspinals, SO   PATIENT EDUCATION:  Education details: rationale for interventions, HEP  Person educated: Patient Education method: Explanation, Demonstration, Tactile cues, Verbal cues Education comprehension: verbalized understanding, returned demonstration, verbal cues required, tactile cues required, and needs further education     HOME EXERCISE PROGRAM: Access Code: [email or web address] URL: [email or web address] Date: 10/18/2023 Prepared by: Carlynn Herald  Exercises - Seated Cervical Retraction  - 1 x daily - 7 x weekly - 2 sets - 10 reps - Seated Scapular Retraction  - 1 x daily - 7 x weekly - 3 sets - 10 reps - Gentle Levator Scapulae Stretch  - 1 x daily - 7 x weekly - 1 sets - 10 reps - 10 seconds hold - Seated Upper Trapezius Stretch  - 1 x daily - 7 x weekly - 1 sets -  10 reps - 10 seconds hold - Seated Shoulder Shrug  - 1 x daily - 7 x weekly - 3 sets - 10 reps - Seated Shoulder Shrug Circles AROM Backward  - 1 x daily - 7 x weekly - 3 sets - 10 reps - Seated Bilateral Shoulder Flexion Towel Slide at Table  Top  - 1 x daily - 7 x weekly - 1 sets - 10 reps - 10 seconds hold - Seated Shoulder Row with Resistance Anchored at Feet  - 1 x daily - 7 x weekly - 3 sets - 10 reps  ASSESSMENT:  CLINICAL IMPRESSION: Patient demonstrates improved shoulder flexion AROM and strength. Functional reaching activities progressed with wall slides in multiple directions. Patient continues to exhibit significant tenderness with palpitation at infraspinatus on L. HEP progressed with resisted rows.    OBJECTIVE IMPAIRMENTS: decreased activity tolerance, decreased ROM, decreased strength, increased muscle spasms, and pain.    GOALS: Goals reviewed with patient? Yes  SHORT TERM GOALS: Target date: 10/26/2023  Pt will be independent in initial HEP Baseline:  Goal status: INITIAL  2.  Pt will improve cervical rotation ROM by 10 degrees bilat Baseline:  Goal status: INITIAL    LONG TERM GOALS: Target date: 11/23/2023  Pt will be independent with advanced HEP Baseline:  Goal status: INITIAL  2.  Pt will improve NDI to <= 22% to demo improved functional mobility Baseline:  42% Goal status: INITIAL  3.  Pt will report being able to wash dishes x 10 minutes with pain <= 2/10 Baseline:  Goal status: INITIAL  4.  Pt will be able to fully return to senior exercise class with pain < = 2/10 Baseline:  Goal status: INITIAL   PLAN:  PT FREQUENCY: 2x/week  PT DURATION: 8 weeks  PLANNED INTERVENTIONS: 97164- PT Re-evaluation, 97110-Therapeutic exercises, 97530- Therapeutic activity, 97112- Neuromuscular re-education, 97535- Self Care, [id number]- Manual therapy, [phone]- Aquatic Therapy, Patient/Family education, Taping, Dry Needling, Cryotherapy, and Moist heat  PLAN FOR NEXT SESSION: Continue cervical mobility, postural strength   Carlynn Herald, PTA 10/18/2023 9:31 AM

## 2023-10-20 NOTE — Therapy (Signed)
 OUTPATIENT PHYSICAL THERAPY CERVICAL TREATMENT   Patient Name: Tanya Hogan MRN: [id number] DOB:Jun 08, 1951, 73 y.o., female Today's Date: 10/21/2023  END OF SESSION:  PT End of Session - 10/21/23 0848     Visit Number 8    Number of Visits 16    Date for PT Re-Evaluation 11/23/23    Authorization Type [employer] Medicare    Authorization Time Period no auth per appt notes    Authorization - Visit Number 4    Progress Note Due on Visit 10    PT Start Time 0849    PT Stop Time 0929    PT Time Calculation (min) 40 min    Activity Tolerance Patient tolerated treatment well    Behavior During Therapy [facility] for tasks assessed/performed             Past Medical History:  Diagnosis Date   Alkaline phosphatase elevation    Asthma    Breast cancer (HCC) 2011   Carpal tunnel syndrome    bilateral   Chronic HFrEF (heart failure with reduced ejection fraction) (HCC)    Cirrhosis, non-alcoholic (HCC)    Closed fracture of unspecified part of radius (alone)    DDD (degenerative disc disease), lumbar    Depression    pt denies   Diabetes mellitus without complication (HCC)    Enlarged heart    Fibromyalgia    GERD (gastroesophageal reflux disease)    History of kidney stones    HTN (hypertension)    Hypothyroidism    IBS (irritable bowel syndrome)    LBBB (left bundle branch block)    Microcytic anemia 05/14/2017   Mitral regurgitation    NICM (nonischemic cardiomyopathy) (HCC)    Pneumonia    Presence of permanent cardiac pacemaker    Pulmonary hypertension (HCC)    PVD (peripheral vascular disease) (HCC)    2019   Sleep apnea    uses cpap   Tricuspid regurgitation    Ventricular tachyarrhythmia [facility])    Past Surgical History:  Procedure Laterality Date   BIV PACEMAKER GENERATOR CHANGEOUT N/A 11/03/2021   Procedure: BIV PACEMAKER GENERATOR CHANGEOUT;  Surgeon: Marinus Maw, MD;  Location: [facility] INVASIVE CV LAB;  Service: Cardiovascular;  Laterality: N/A;   BREAST  LUMPECTOMY Left 2011   COLONOSCOPY     FOOT SURGERY     KNEE SURGERY     LEAD REVISION/REPAIR N/A 11/03/2021   Procedure: LEAD REVISION/REPAIR;  Surgeon: Marinus Maw, MD;  Location: [facility] INVASIVE CV LAB;  Service: Cardiovascular;  Laterality: N/A;   MASTECTOMY Right    05/2019   PACEMAKER GENERATOR CHANGE N/A 08/23/2014   Procedure: PACEMAKER GENERATOR CHANGE;  Surgeon: Marinus Maw, MD;  Location: [facility] CATH LAB;  Service: Cardiovascular;  Laterality: N/A;   PACEMAKER PLACEMENT  2009   [facility]   RECTAL SURGERY  1976   REDUCTION MAMMAPLASTY Left    05/2019   TOTAL SHOULDER ARTHROPLASTY Right 03/10/2018   TOTAL SHOULDER ARTHROPLASTY Right 03/10/2018   Procedure: RIGHT TOTAL SHOULDER ARTHROPLASTY;  Surgeon: Jones Broom, MD;  Location: [facility] OR;  Service: Orthopedics;  Laterality: Right;   TUBAL LIGATION     Patient Active Problem List   Diagnosis Date Noted   Presence of heart assist device (HCC) 10/07/2023   Primary osteoarthritis of left shoulder 09/08/2023   Injection site reaction 05/17/2023   Nonischemic cardiomyopathy (HCC) 02/16/2023   Class 3 obesity 10/30/2022   Acute on chronic systolic CHF (congestive heart failure) (HCC) 10/29/2022  Stress and adjustment reaction 04/08/2022   Gastroesophageal reflux disease 02/16/2022   AV block, 3rd degree (HCC) 10/24/2021   COPD (chronic obstructive pulmonary disease) (HCC) 09/21/2021   Osteoporosis 02/28/2021   Primary osteoarthritis of both knees 01/14/2021   Irritable bowel syndrome with constipation 09/16/2020   Aortic atherosclerosis (HCC) 06/11/2020   Benign lipomatous neoplasm of skin and subcutaneous tissue of left leg 06/11/2020   NSVT (nonsustained ventricular tachycardia) (HCC) 02/14/2020   Chronic constipation 10/06/2019   Genetic testing 08/14/2019   Ductal carcinoma in situ (DCIS) of right breast 06/15/2019   Arthralgia 04/11/2019   Malignant tumor of breast (HCC) 03/06/2019   Fatigue 03/06/2019    DDD (degenerative disc disease), cervical 11/19/2017   Peripheral edema 05/19/2017   Microcytic anemia 05/14/2017   Delayed sleep phase syndrome 10/29/2016   Primary osteoarthritis of left ankle 09/24/2016   Gastrointestinal food sensitivity 05/21/2016   History of arthroplasty of right shoulder 02/24/2016   Bilateral foot pain 01/22/2016   NASH (nonalcoholic steatohepatitis) 05/26/2015    Class: Stage 3   Nasal septal perforation 07/11/2014   Controlled diabetes mellitus type 2 with complications (HCC) 05/25/2014   Physical deconditioning 04/24/2014   Obesity, Class II, BMI 35-39.9, with comorbidity 06/09/2013   OSA (obstructive sleep apnea) 06/08/2013   Chronic systolic heart failure (HCC) 05/23/2013   Asthma, moderate persistent 04/18/2013   Chronic cough 04/18/2013   Breast cancer of upper-inner quadrant of left female breast (HCC) 03/18/2010   Type 2 diabetes mellitus with hyperlipidemia (HCC) 11/07/2009   Fibromyalgia 10/29/2009   Biventricular cardiac pacemaker in situ 10/23/2009   Abnormal levels of other serum enzymes 10/13/2009   Hypothyroidism 10/03/2009   Essential hypertension 07/02/2009   Chronic back pain 07/02/2009    PCP: Linford Arnold   REFERRING PROVIDER: Benjamin Stain  REFERRING DIAG: cervical DDD  THERAPY DIAG:  Cervicalgia  Other symptoms and signs involving the musculoskeletal system  Rationale for Evaluation and Treatment: Rehabilitation  ONSET DATE: 09/10/23  SUBJECTIVE:                                                                                                                                                                                                         SUBJECTIVE STATEMENT:  Patient reports she slept more than usual yesterday. Today I have a catch at the point of my L shoulder blade. Overall neck and shoulders are doing pretty good.   EVAL: Pt states she was sick for a while and then returned to her "active seniors" group. She was  having difficulty lifting her Lt shoulder when  she returned and she saw MD. MD gave her an injection in her Lt shoulder which she states caused her to have neck and Rt shoulder pain. The pain was so bad she had to go to ED who gave her pain medicine. She reports the pain has decreased some but her main complaint is lack of strength in her shoulders and her neck. She states her neck continues with pain with walking "heavy", prolonged standing with looking down. Pt uses a cervical collar that she states she uses very sparingly but it does help Hand dominance: Right  PERTINENT HISTORY:  Rt Total shoulder replacement Pacemaker History of breast cancer with mastectomy Bilateral carpal tunnel  PAIN:  Are you having pain? Yes: NPRS scale: 5-6 currently, 8/10 at worst Pain location: neck to bilat UEs Pain description: sharp Aggravating factors: prolonged standing with looking down, "jolting" Relieving factors: cervical collar  PRECAUTIONS: ICD/Pacemaker  RED FLAGS: None     WEIGHT BEARING RESTRICTIONS: No  FALLS:  Has patient fallen in last 6 months? Yes. Number of falls multiple - pt states when she has tachycardia she can fall   OCCUPATION: retired  PLOF: Independent  PATIENT GOALS: decrease pain, use her LT UE better  NEXT MD VISIT: 11/18/2023  OBJECTIVE:  Note: Objective measures were completed at Evaluation unless otherwise noted.  DIAGNOSTIC FINDINGS:  X ray performed but not read on Eval  PATIENT SURVEYS:  NDI 21/50 42%  COGNITION: Overall cognitive status: Within functional limits for tasks assessed  SENSATION: Pt reports she occasionally has numbness in her fingers bilat  POSTURE: rounded shoulders and forward head  PALPATION: Pt TTP throughout cervical musculature, very TTP on cervical spinous processes - unable to tolerate more than light touch with palpation   CERVICAL ROM:   Active ROM A/PROM (deg) eval  Flexion 20  Extension 15  Right lateral flexion  18  Left lateral flexion 11  Right rotation 40  Left rotation 30   (Blank rows = not tested)  UPPER EXTREMITY ROM:  Lt shoulder limited to < 90 degrees flexion and abduction  UPPER EXTREMITY MMT:  MMT Right eval Left eval  Shoulder flexion 3 3-  Shoulder extension    Shoulder abduction 3 3-  Shoulder adduction    Shoulder extension    Shoulder internal rotation    Shoulder external rotation    Middle trapezius    Lower trapezius    Elbow flexion    Elbow extension    Wrist flexion    Wrist extension    Wrist ulnar deviation    Wrist radial deviation    Wrist pronation    Wrist supination    Grip strength     (Blank rows = not tested)    TREATMENT DATE:                                                                                                                              [facility] Adult  PT Treatment:                                                DATE: 10/21/2023 Therapeutic Exercise: Seated: Red PB roll out for shoulder flexion & thoracolumbar stretch Shoulder pulleys: flexion & scaption x 2 min each Seated with noodle:  Bkwd shoulder circles Shoulder shrugs/depression holding 1#DB x10 Bicep curls + 1#DB, then unilaterally with 2# 2x 5 Seated lat pull GTB 2 x 10 Shoulder flexion (thigh to 90*) x 10 cues to keep shoulder down Standing: cabinet reaches 2x5 to second shelf  Low trap lift off x 3 very difficult Seated resisted rows + RTB 2x10 Seated neck stretching and scapular retraction Manual Therapy: Seated: passive scapular ROM for relaxation of UT and levator  [facility] Adult PT Treatment:                                                DATE: 10/18/2023 Therapeutic Exercise: Seated: Red PB roll out for shoulder flexion & thoracolumbar stretch Shoulder pulleys: flexion & scaption x 2 min each Seated with noodle:  Bkwd shoulder circles Shoulder shrugs/depression holding 1#DB x10 Bicep curls + 1#DB Shoulder flexion (thigh to 90*) 2x5 (alt UE) Standing: wall  slides with towel --> flexion, scaption, abduction Seated resisted rows + RTB Manual Therapy: Seated: STM L UT, LS, rhomboid, infraspinatus, deltoid, cervical paraspinals; gentle trigger point release L LS    [facility] Adult PT Treatment:                                                DATE: 10/15/2023 Therapeutic Exercise: Seated passive pec stretch (L) Standing bicep stretch holding edge of table 10x5" (L) Seated + noodle at back: Scap retractions 2x10 Shoulder flexion AAROM with cane 2x10 Shoulder shrugs/depression x10 Cervical AROM all directions x10 each Chin tucks x10 Manual Therapy: Seated: STM L UT, LS, rhomboid, infraspinatus, deltoid, cervical paraspinals; gentle trigger point release L LS    [facility] Adult PT Treatment:                                                DATE: 10/12/23 Therapeutic Exercise: Seated scap retractions 2x8 w/ pool noodle Seated shoulder flexion AAROM w/ cane 2x8 Shoulder shrugs/depression x12 Manual Therapy: Seated; STM L UT, LS, rhomboid, infraspinatus, deltoid, cervical paraspinals; gentle trigger point release L LS    [facility] Adult PT Treatment:                                                DATE: 10/08/2023 Therapeutic Exercise: Pulley flexion x 2 mins Table slide flexion 10x10" Seated with noodle: Scap squeezes x10 Bent arm shoulder ER x 15 Backward shoulder rolls Gentle UT & LS stretches Chin tucks --> continues to produce symptoms down L arm Gentle thoracic extension (no noodle) Nerve glides:  radial, median, ulnar Shoulder shrugs & dperession Manual Therapy: STM UT, LS, SCM, cervical paraspinals, SO   PATIENT EDUCATION:  Education details: rationale for interventions, HEP  Person educated: Patient Education method: Explanation, Demonstration, Tactile cues, Verbal cues Education comprehension: verbalized understanding, returned demonstration, verbal cues required, tactile cues required, and needs further education     HOME EXERCISE  PROGRAM: Access Code: [email or web address] URL: [email or web address] Date: 10/18/2023 Prepared by: Carlynn Herald  Exercises - Seated Cervical Retraction  - 1 x daily - 7 x weekly - 2 sets - 10 reps - Seated Scapular Retraction  - 1 x daily - 7 x weekly - 3 sets - 10 reps - Gentle Levator Scapulae Stretch  - 1 x daily - 7 x weekly - 1 sets - 10 reps - 10 seconds hold - Seated Upper Trapezius Stretch  - 1 x daily - 7 x weekly - 1 sets - 10 reps - 10 seconds hold - Seated Shoulder Shrug  - 1 x daily - 7 x weekly - 3 sets - 10 reps - Seated Shoulder Shrug Circles AROM Backward  - 1 x daily - 7 x weekly - 3 sets - 10 reps - Seated Bilateral Shoulder Flexion Towel Slide at Table Top  - 1 x daily - 7 x weekly - 1 sets - 10 reps - 10 seconds hold - Seated Shoulder Row with Resistance Anchored at Feet  - 1 x daily - 7 x weekly - 3 sets - 10 reps  ASSESSMENT:  CLINICAL IMPRESSION: Tanya Hogan reports a catch in her left shoulder blade which is relieved with stretching and manual therapy. She is very weak in her low traps. She did well with cabinet reaches with no weight demonstrating good scapular control. Pain seems to be decreasing overall, however she is still very tender with palpation in L neck and parascapular muscles.   OBJECTIVE IMPAIRMENTS: decreased activity tolerance, decreased ROM, decreased strength, increased muscle spasms, and pain.    GOALS: Goals reviewed with patient? Yes  SHORT TERM GOALS: Target date: 10/26/2023  Pt will be independent in initial HEP Baseline:  Goal status: INITIAL  2.  Pt will improve cervical rotation ROM by 10 degrees bilat Baseline:  Goal status: INITIAL    LONG TERM GOALS: Target date: 11/23/2023  Pt will be independent with advanced HEP Baseline:  Goal status: INITIAL  2.  Pt will improve NDI to <= 22% to demo improved functional mobility Baseline:  42% Goal status: INITIAL  3.  Pt will report being able to wash dishes x 10 minutes with pain  <= 2/10 Baseline:  Goal status: INITIAL  4.  Pt will be able to fully return to senior exercise class with pain < = 2/10 Baseline:  Goal status: INITIAL   PLAN:  PT FREQUENCY: 2x/week  PT DURATION: 8 weeks  PLANNED INTERVENTIONS: 97164- PT Re-evaluation, 97110-Therapeutic exercises, 97530- Therapeutic activity, 97112- Neuromuscular re-education, 97535- Self Care, 28413- Manual therapy, [phone]- Aquatic Therapy, Patient/Family education, Taping, Dry Needling, Cryotherapy, and Moist heat  PLAN FOR NEXT SESSION: Continue cervical mobility, postural strength   Solon Palm, PT  10/21/2023 1:02 PM

## 2023-10-21 ENCOUNTER — Encounter: Payer: Self-pay | Admitting: Physical Therapy

## 2023-10-21 ENCOUNTER — Ambulatory Visit: Payer: 59 | Admitting: Physical Therapy

## 2023-10-21 ENCOUNTER — Encounter: Payer: Self-pay | Admitting: Sports Medicine

## 2023-10-21 ENCOUNTER — Ambulatory Visit (INDEPENDENT_AMBULATORY_CARE_PROVIDER_SITE_OTHER): Payer: 59 | Admitting: Sports Medicine

## 2023-10-21 DIAGNOSIS — M5030 Other cervical disc degeneration, unspecified cervical region: Secondary | ICD-10-CM | POA: Diagnosis not present

## 2023-10-21 DIAGNOSIS — Z950 Presence of cardiac pacemaker: Secondary | ICD-10-CM | POA: Diagnosis not present

## 2023-10-21 DIAGNOSIS — R29898 Other symptoms and signs involving the musculoskeletal system: Secondary | ICD-10-CM

## 2023-10-21 DIAGNOSIS — M47892 Other spondylosis, cervical region: Secondary | ICD-10-CM | POA: Diagnosis not present

## 2023-10-21 DIAGNOSIS — M62838 Other muscle spasm: Secondary | ICD-10-CM | POA: Diagnosis not present

## 2023-10-21 DIAGNOSIS — M542 Cervicalgia: Secondary | ICD-10-CM

## 2023-10-21 NOTE — Assessment & Plan Note (Signed)
 Tanya Hogan returns, she has known cervical spondylosis, unable to have MRIs due to her pacemaker, we did get a cervical spine CT with C5-C6 spondylosis noted, she had a cervical epidural without improvement. Of note she also had some uncontrolled depressive symptoms. I really think she just needed to get moving, we started formal PT and she returns today, she has improved considerably. She continues to improve with PT so we will continue this for at least another 6 to 8 weeks. We did discuss the utility of duloxetine in controlling pain and depressive symptoms, but we will keep this on the back burner for now as she is improving without additional medication.

## 2023-10-21 NOTE — Progress Notes (Signed)
    Procedures performed today:    None.  Independent interpretation of notes and tests performed by another provider:   None.  Brief History, Exam, Impression, and Recommendations:    DDD (degenerative disc disease), cervical Graceyn returns, she has known cervical spondylosis, unable to have MRIs due to her pacemaker, we did get a cervical spine CT with C5-C6 spondylosis noted, she had a cervical epidural without improvement. Of note she also had some uncontrolled depressive symptoms. I really think she just needed to get moving, we started formal PT and she returns today, she has improved considerably. She continues to improve with PT so we will continue this for at least another 6 to 8 weeks. We did discuss the utility of duloxetine in controlling pain and depressive symptoms, but we will keep this on the back burner for now as she is improving without additional medication.    ____________________________________________ Ihor Austin. Benjamin Stain, M.D., ABFM., CAQSM., AME. Primary Care and Sports Medicine [facility]  Adjunct Professor of Family Medicine  [employer]  Restaurant manager, fast food

## 2023-10-25 ENCOUNTER — Ambulatory Visit: Payer: 59 | Attending: Sports Medicine

## 2023-10-25 ENCOUNTER — Ambulatory Visit: Payer: Self-pay | Admitting: Licensed Clinical Social Worker

## 2023-10-25 DIAGNOSIS — R29898 Other symptoms and signs involving the musculoskeletal system: Secondary | ICD-10-CM | POA: Diagnosis present

## 2023-10-25 DIAGNOSIS — M542 Cervicalgia: Secondary | ICD-10-CM | POA: Insufficient documentation

## 2023-10-25 NOTE — Patient Outreach (Signed)
 Care Coordination   Follow Up Visit Note   10/25/2023 Name: Tanya Hogan MRN: [id number] DOB: [birth year]  Tanya Hogan is a 73 y.o. year old female who sees [name], Barbarann Ehlers, MD for primary care. I spoke with  Tanya Hogan by phone today.  What matters to the patients health and wellness today?  HHA wanted    Goals Addressed             This Visit's Progress    Car Coordination Activities   On track    Care Coordination Interventions: Patient stated that she was recently in the hospital in the last week of November and has been home and that the hospital provided her a lighter Lolli that is better for her to use.  Patient stated that her brother is in her home but he has parkinson diease and not to much help to her and that  she could use a HHA to help her take baths and to assist her in the home. The SW will inbox  the PCP. Patient still in need of a HHA, Patient is going to call [facility] to see if they will cover one. Sw will contact the PCP to let her know that is willing to take an order for limited time for PT and a HHA if insurance will not cover.  The patient stated that she is getting the U-card for food and the patient does not have any other SDOH issues or concerns SW will follow up 3/19/20252025 at 11:00 am        SDOH assessments and interventions completed:  Yes  SDOH Interventions Today    Flowsheet Row Most Recent Value  SDOH Interventions   Food Insecurity Interventions Intervention Not Indicated  Housing Interventions Intervention Not Indicated  Transportation Interventions Intervention Not Indicated  Utilities Interventions Intervention Not Indicated        Care Coordination Interventions:  Yes, provided  Interventions Today    Flowsheet Row Most Recent Value  General Interventions   General Interventions Discussed/Reviewed General Interventions Reviewed  [Patient is still wanting a HH, PCP did make the referral and PT has not heard anything  back yet. SW will contact and follow up with PCP]        Follow up plan: Follow up call scheduled for 11/10/2023 at 11:00 am    Encounter Outcome:  Patient Visit Completed   .cns

## 2023-10-25 NOTE — Therapy (Signed)
 OUTPATIENT PHYSICAL THERAPY CERVICAL TREATMENT   Patient Name: Tanya Hogan MRN: [id number] DOB:Sep 01, 1950, 73 y.o., female Today's Date: 10/25/2023  END OF SESSION:  PT End of Session - 10/25/23 0848     Visit Number 9    Number of Visits 16    Date for PT Re-Evaluation 11/23/23    Authorization Type [employer] Medicare    Progress Note Due on Visit 10    PT Start Time 0848    PT Stop Time 0930    PT Time Calculation (min) 42 min    Activity Tolerance Patient tolerated treatment well    Behavior During Therapy WFL for tasks assessed/performed             Past Medical History:  Diagnosis Date   Alkaline phosphatase elevation    Asthma    Breast cancer (HCC) 2011   Carpal tunnel syndrome    bilateral   Chronic HFrEF (heart failure with reduced ejection fraction) (HCC)    Cirrhosis, non-alcoholic (HCC)    Closed fracture of unspecified part of radius (alone)    DDD (degenerative disc disease), lumbar    Depression    pt denies   Diabetes mellitus without complication (HCC)    Enlarged heart    Fibromyalgia    GERD (gastroesophageal reflux disease)    History of kidney stones    HTN (hypertension)    Hypothyroidism    IBS (irritable bowel syndrome)    LBBB (left bundle branch block)    Microcytic anemia 05/14/2017   Mitral regurgitation    NICM (nonischemic cardiomyopathy) (HCC)    Pneumonia    Presence of permanent cardiac pacemaker    Pulmonary hypertension (HCC)    PVD (peripheral vascular disease) (HCC)    2019   Sleep apnea    uses cpap   Tricuspid regurgitation    Ventricular tachyarrhythmia [facility])    Past Surgical History:  Procedure Laterality Date   BIV PACEMAKER GENERATOR CHANGEOUT N/A 11/03/2021   Procedure: BIV PACEMAKER GENERATOR CHANGEOUT;  Surgeon: Marinus Maw, MD;  Location: [facility] INVASIVE CV LAB;  Service: Cardiovascular;  Laterality: N/A;   BREAST LUMPECTOMY Left 2011   COLONOSCOPY     FOOT SURGERY     KNEE SURGERY     LEAD REVISION/REPAIR  N/A 11/03/2021   Procedure: LEAD REVISION/REPAIR;  Surgeon: Marinus Maw, MD;  Location: [facility] INVASIVE CV LAB;  Service: Cardiovascular;  Laterality: N/A;   MASTECTOMY Right    05/2019   PACEMAKER GENERATOR CHANGE N/A 08/23/2014   Procedure: PACEMAKER GENERATOR CHANGE;  Surgeon: Marinus Maw, MD;  Location: [facility] CATH LAB;  Service: Cardiovascular;  Laterality: N/A;   PACEMAKER PLACEMENT  2009   [facility]   RECTAL SURGERY  1976   REDUCTION MAMMAPLASTY Left    05/2019   TOTAL SHOULDER ARTHROPLASTY Right 03/10/2018   TOTAL SHOULDER ARTHROPLASTY Right 03/10/2018   Procedure: RIGHT TOTAL SHOULDER ARTHROPLASTY;  Surgeon: Jones Broom, MD;  Location: [facility] OR;  Service: Orthopedics;  Laterality: Right;   TUBAL LIGATION     Patient Active Problem List   Diagnosis Date Noted   Presence of heart assist device (HCC) 10/07/2023   Primary osteoarthritis of left shoulder 09/08/2023   Injection site reaction 05/17/2023   Nonischemic cardiomyopathy (HCC) 02/16/2023   Class 3 obesity 10/30/2022   Acute on chronic systolic CHF (congestive heart failure) (HCC) 10/29/2022   Stress and adjustment reaction 04/08/2022   Gastroesophageal reflux disease 02/16/2022   AV block, 3rd degree (HCC)  10/24/2021   COPD (chronic obstructive pulmonary disease) (HCC) 09/21/2021   Osteoporosis 02/28/2021   Primary osteoarthritis of both knees 01/14/2021   Irritable bowel syndrome with constipation 09/16/2020   Aortic atherosclerosis (HCC) 06/11/2020   Benign lipomatous neoplasm of skin and subcutaneous tissue of left leg 06/11/2020   NSVT (nonsustained ventricular tachycardia) (HCC) 02/14/2020   Chronic constipation 10/06/2019   Genetic testing 08/14/2019   Ductal carcinoma in situ (DCIS) of right breast 06/15/2019   Arthralgia 04/11/2019   Malignant tumor of breast (HCC) 03/06/2019   Fatigue 03/06/2019   DDD (degenerative disc disease), cervical 11/19/2017   Peripheral edema 05/19/2017   Microcytic  anemia 05/14/2017   Delayed sleep phase syndrome 10/29/2016   Primary osteoarthritis of left ankle 09/24/2016   Gastrointestinal food sensitivity 05/21/2016   History of arthroplasty of right shoulder 02/24/2016   Bilateral foot pain 01/22/2016   NASH (nonalcoholic steatohepatitis) 05/26/2015    Class: Stage 3   Nasal septal perforation 07/11/2014   Controlled diabetes mellitus type 2 with complications (HCC) 05/25/2014   Physical deconditioning 04/24/2014   Obesity, Class II, BMI 35-39.9, with comorbidity 06/09/2013   OSA (obstructive sleep apnea) 06/08/2013   Chronic systolic heart failure (HCC) 05/23/2013   Asthma, moderate persistent 04/18/2013   Chronic cough 04/18/2013   Breast cancer of upper-inner quadrant of left female breast (HCC) 03/18/2010   Type 2 diabetes mellitus with hyperlipidemia (HCC) 11/07/2009   Fibromyalgia 10/29/2009   Biventricular cardiac pacemaker in situ 10/23/2009   Abnormal levels of other serum enzymes 10/13/2009   Hypothyroidism 10/03/2009   Essential hypertension 07/02/2009   Chronic back pain 07/02/2009    PCP: Linford Arnold   REFERRING PROVIDER: Benjamin Stain  REFERRING DIAG: cervical DDD  THERAPY DIAG:  Cervicalgia  Other symptoms and signs involving the musculoskeletal system  Rationale for Evaluation and Treatment: Rehabilitation  ONSET DATE: 09/10/23  SUBJECTIVE:                                                                                                                                                                                                         SUBJECTIVE STATEMENT:  Patient reports she is feeling good today and would like to "push it more today". Patient states she has less pain in shoulder when raising arm, states it feels more tight.  EVAL: Pt states she was sick for a while and then returned to her "active seniors" group. She was having difficulty lifting her Lt shoulder when she returned and she saw MD. MD gave her an  injection in her Lt shoulder which she  states caused her to have neck and Rt shoulder pain. The pain was so bad she had to go to ED who gave her pain medicine. She reports the pain has decreased some but her main complaint is lack of strength in her shoulders and her neck. She states her neck continues with pain with walking "heavy", prolonged standing with looking down. Pt uses a cervical collar that she states she uses very sparingly but it does help Hand dominance: Right  PERTINENT HISTORY:  Rt Total shoulder replacement Pacemaker History of breast cancer with mastectomy Bilateral carpal tunnel  PAIN:  Are you having pain? Yes: NPRS scale: 5-6 currently, 8/10 at worst Pain location: neck to bilat UEs Pain description: sharp Aggravating factors: prolonged standing with looking down, "jolting" Relieving factors: cervical collar  PRECAUTIONS: ICD/Pacemaker  RED FLAGS: None     WEIGHT BEARING RESTRICTIONS: No  FALLS:  Has patient fallen in last 6 months? Yes. Number of falls multiple - pt states when she has tachycardia she can fall   OCCUPATION: retired  PLOF: Independent  PATIENT GOALS: decrease pain, use her LT UE better  NEXT MD VISIT: 11/18/2023  OBJECTIVE:  Note: Objective measures were completed at Evaluation unless otherwise noted.  DIAGNOSTIC FINDINGS:  X ray performed but not read on Eval  PATIENT SURVEYS:  NDI 21/50 42%  COGNITION: Overall cognitive status: Within functional limits for tasks assessed  SENSATION: Pt reports she occasionally has numbness in her fingers bilat  POSTURE: rounded shoulders and forward head  PALPATION: Pt TTP throughout cervical musculature, very TTP on cervical spinous processes - unable to tolerate more than light touch with palpation   CERVICAL ROM:   Active ROM A/PROM (deg) eval AROM 10/25/23  Flexion 20 23  Extension 15 20  Right lateral flexion 18 30  Left lateral flexion 11 35  Right rotation 40 50  Left  rotation 30 56   (Blank rows = not tested)  UPPER EXTREMITY ROM:  Lt shoulder limited to < 90 degrees flexion and abduction  UPPER EXTREMITY MMT:  MMT Right eval Left eval  Shoulder flexion 3 3-  Shoulder extension    Shoulder abduction 3 3-  Shoulder adduction    Shoulder extension    Shoulder internal rotation    Shoulder external rotation    Middle trapezius    Lower trapezius    Elbow flexion    Elbow extension    Wrist flexion    Wrist extension    Wrist ulnar deviation    Wrist radial deviation    Wrist pronation    Wrist supination    Grip strength     (Blank rows = not tested)    TREATMENT DATE:                                                                                                                              [facility] Adult PT Treatment:  DATE: 10/25/2023 Therapeutic Exercise: Standing:  Green PB roll out shoulder stretches: flexion & scaption Shoulder shrugs/depression + 1#DB Shoulder flexion & scaption to 90* + 1#DB x10 each Seated neck stretches: UT/LS Seated scap squeezes Standing:  Shoulder extension + RTB x10 Rows + GTB 2x10 Neuromuscular re-ed: Side Lying: (muscular activation & scapulohumeral rhythm cueing) Shoulder ER + 1#DB Shoulder abd + tactile cues for scap MWM Shoulder flexion to 90* Alt shoulder flexion <--> extension Bent arm elbow circles --> scap MWM    [facility] Adult PT Treatment:                                                DATE: 10/21/2023 Therapeutic Exercise: Seated: Red PB roll out for shoulder flexion & thoracolumbar stretch Shoulder pulleys: flexion & scaption x 2 min each Seated with noodle:  Bkwd shoulder circles Shoulder shrugs/depression holding 1#DB x10 Bicep curls + 1#DB, then unilaterally with 2# 2x 5 Seated lat pull GTB 2 x 10 Shoulder flexion (thigh to 90*) x 10 cues to keep shoulder down Standing: cabinet reaches 2x5 to second shelf  Low trap lift off x 3 very  difficult Seated resisted rows + RTB 2x10 Seated neck stretching and scapular retraction Manual Therapy: Seated: passive scapular ROM for relaxation of UT and levator    [facility] Adult PT Treatment:                                                DATE: 10/18/2023 Therapeutic Exercise: Seated: Red PB roll out for shoulder flexion & thoracolumbar stretch Shoulder pulleys: flexion & scaption x 2 min each Seated with noodle:  Bkwd shoulder circles Shoulder shrugs/depression holding 1#DB x10 Bicep curls + 1#DB Shoulder flexion (thigh to 90*) 2x5 (alt UE) Standing: wall slides with towel --> flexion, scaption, abduction Seated resisted rows + RTB Manual Therapy: Seated: STM L UT, LS, rhomboid, infraspinatus, deltoid, cervical paraspinals; gentle trigger point release L LS   PATIENT EDUCATION:  Education details: rationale for interventions, HEP  Person educated: Patient Education method: Explanation, Demonstration, Tactile cues, Verbal cues Education comprehension: verbalized understanding, returned demonstration, verbal cues required, tactile cues required, and needs further education     HOME EXERCISE PROGRAM: Access Code: [email or web address] URL: [email or web address] Date: 10/18/2023 Prepared by: Carlynn Herald  Exercises - Seated Cervical Retraction  - 1 x daily - 7 x weekly - 2 sets - 10 reps - Seated Scapular Retraction  - 1 x daily - 7 x weekly - 3 sets - 10 reps - Gentle Levator Scapulae Stretch  - 1 x daily - 7 x weekly - 1 sets - 10 reps - 10 seconds hold - Seated Upper Trapezius Stretch  - 1 x daily - 7 x weekly - 1 sets - 10 reps - 10 seconds hold - Seated Shoulder Shrug  - 1 x daily - 7 x weekly - 3 sets - 10 reps - Seated Shoulder Shrug Circles AROM Backward  - 1 x daily - 7 x weekly - 3 sets - 10 reps - Seated Bilateral Shoulder Flexion Towel Slide at Table Top  - 1 x daily - 7 x weekly - 1 sets - 10 reps - 10 seconds hold - Seated Shoulder Row with Resistance  Anchored at Feet  - 1 x daily - 7 x weekly - 3 sets - 10 reps  ASSESSMENT:  CLINICAL IMPRESSION:  Good progression measured with cervical AROM in all directions, most notably with cervical rotation and lateral flexion. Shoulder and postural strengthening progressed with standing exercises; side lying shoulder exercises added to progress scapulohumeral rhythm mechanics. Cueing provided to improve postural stability and alignment during side lying shoulder external rotation.    OBJECTIVE IMPAIRMENTS: decreased activity tolerance, decreased ROM, decreased strength, increased muscle spasms, and pain.    GOALS: Goals reviewed with patient? Yes  SHORT TERM GOALS: Target date: 10/26/2023  Pt will be independent in initial HEP Baseline:  Goal status: MET  2.  Pt will improve cervical rotation ROM by 10 degrees bilat Baseline: see above Goal status: MET    LONG TERM GOALS: Target date: 11/23/2023  Pt will be independent with advanced HEP Baseline:  Goal status: INITIAL  2.  Pt will improve NDI to <= 22% to demo improved functional mobility Baseline:  42% Goal status: INITIAL  3.  Pt will report being able to wash dishes x 10 minutes with pain <= 2/10 Baseline:  Goal status: INITIAL  4.  Pt will be able to fully return to senior exercise class with pain < = 2/10 Baseline:  Goal status: INITIAL   PLAN:  PT FREQUENCY: 2x/week  PT DURATION: 8 weeks  PLANNED INTERVENTIONS: 97164- PT Re-evaluation, 97110-Therapeutic exercises, 97530- Therapeutic activity, 97112- Neuromuscular re-education, 97535- Self Care, 16109- Manual therapy, [id number]- Aquatic Therapy, Patient/Family education, Taping, Dry Needling, Cryotherapy, and Moist heat  PLAN FOR NEXT SESSION: Progress Note due next visit. Continue cervical mobility, postural strength   Carlynn Herald, PTA 10/25/2023 9:30 AM

## 2023-10-25 NOTE — Patient Instructions (Signed)
 Visit Information  Thank you for taking time to visit with me today. Please don't hesitate to contact me if I can be of assistance to you.   Following are the goals we discussed today:   Goals Addressed             This Visit's Progress    Car Coordination Activities   On track    Care Coordination Interventions: Patient stated that she was recently in the hospital in the last week of November and has been home and that the hospital provided her a lighter [place] that is better for her to use.  Patient stated that her brother is in her home but he has parkinson diease and not to much help to her and that  she could use a HHA to help her take baths and to assist her in the home. The SW will inbox  the PCP. Patient still in need of a HHA, Patient is going to call [facility] to see if they will cover one. Sw will contact the PCP to let her know that is willing to take an order for limited time for PT and a HHA if insurance will not cover.  The patient stated that she is getting the U-card for food and the patient does not have any other SDOH issues or concerns SW will follow up 3/19/20252025 at 11:00 am        Our next appointment is by telephone on 11/10/2023 at 11:00 am  Please call the care guide team at [phone] if you need to cancel or reschedule your appointment.   If you are experiencing a Mental Health or Behavioral Health Crisis or need someone to talk to, please call the Suicide and Crisis Lifeline: 988 go to [facility] Urgent [facility] [address] [place] [phone]) call 911  Patient verbalizes understanding of instructions and care plan provided today and agrees to view in MyChart. Active MyChart status and patient understanding of how to access instructions and care plan via MyChart confirmed with patient.     Jeanie Cooks, PhD [facility], [facility] Social Worker Direct Dial: [phone]  Fax:  [phone]

## 2023-10-26 ENCOUNTER — Telehealth: Payer: Self-pay

## 2023-10-26 ENCOUNTER — Other Ambulatory Visit: Payer: Self-pay | Admitting: Family Medicine

## 2023-10-26 DIAGNOSIS — E038 Other specified hypothyroidism: Secondary | ICD-10-CM

## 2023-10-27 DIAGNOSIS — E118 Type 2 diabetes mellitus with unspecified complications: Secondary | ICD-10-CM | POA: Diagnosis not present

## 2023-10-27 NOTE — Therapy (Signed)
 OUTPATIENT PHYSICAL THERAPY CERVICAL TREATMENT AND 10TH VISIT PROGRESS NOTE  Reporting Period 09/28/23 to 10/28/23   See note below for Objective Data and Assessment of Progress/Goals.    Patient Name: Tanya Hogan MRN: [id number] DOB:1950/09/27, 73 y.o., female Today's Date: 10/28/2023  END OF SESSION:  PT End of Session - 10/28/23 0849     Visit Number 10    Number of Visits 16    Date for PT Re-Evaluation 11/23/23    Authorization Type [employer] Medicare    Authorization Time Period no auth per appt notes    Progress Note Due on Visit 10    PT Start Time 0850    PT Stop Time 0931    PT Time Calculation (min) 41 min    Activity Tolerance Patient tolerated treatment well    Behavior During Therapy [facility] for tasks assessed/performed              Past Medical History:  Diagnosis Date   Alkaline phosphatase elevation    Asthma    Breast cancer (HCC) 2011   Carpal tunnel syndrome    bilateral   Chronic HFrEF (heart failure with reduced ejection fraction) (HCC)    Cirrhosis, non-alcoholic (HCC)    Closed fracture of unspecified part of radius (alone)    DDD (degenerative disc disease), lumbar    Depression    pt denies   Diabetes mellitus without complication (HCC)    Enlarged heart    Fibromyalgia    GERD (gastroesophageal reflux disease)    History of kidney stones    HTN (hypertension)    Hypothyroidism    IBS (irritable bowel syndrome)    LBBB (left bundle branch block)    Microcytic anemia 05/14/2017   Mitral regurgitation    NICM (nonischemic cardiomyopathy) (HCC)    Pneumonia    Presence of permanent cardiac pacemaker    Pulmonary hypertension (HCC)    PVD (peripheral vascular disease) (HCC)    2019   Sleep apnea    uses cpap   Tricuspid regurgitation    Ventricular tachyarrhythmia [facility])    Past Surgical History:  Procedure Laterality Date   BIV PACEMAKER GENERATOR CHANGEOUT N/A 11/03/2021   Procedure: BIV PACEMAKER GENERATOR CHANGEOUT;  Surgeon: Marinus Maw, MD;  Location: [facility] INVASIVE CV LAB;  Service: Cardiovascular;  Laterality: N/A;   BREAST LUMPECTOMY Left 2011   COLONOSCOPY     FOOT SURGERY     KNEE SURGERY     LEAD REVISION/REPAIR N/A 11/03/2021   Procedure: LEAD REVISION/REPAIR;  Surgeon: Marinus Maw, MD;  Location: [facility];  Service: Cardiovascular;  Laterality: N/A;   MASTECTOMY Right    05/2019   PACEMAKER GENERATOR CHANGE N/A 08/23/2014   Procedure: PACEMAKER GENERATOR CHANGE;  Surgeon: Marinus Maw, MD;  Location: [facility] CATH LAB;  Service: Cardiovascular;  Laterality: N/A;   PACEMAKER PLACEMENT  2009   [place] [facility]   RECTAL SURGERY  1976   REDUCTION MAMMAPLASTY Left    05/2019   TOTAL SHOULDER ARTHROPLASTY Right 03/10/2018   TOTAL SHOULDER ARTHROPLASTY Right 03/10/2018   Procedure: RIGHT TOTAL SHOULDER ARTHROPLASTY;  Surgeon: Jones Broom, MD;  Location: [facility] OR;  Service: Orthopedics;  Laterality: Right;   TUBAL LIGATION     Patient Active Problem List   Diagnosis Date Noted   Presence of heart assist device (HCC) 10/07/2023   Primary osteoarthritis of left shoulder 09/08/2023   Injection site reaction 05/17/2023   Nonischemic cardiomyopathy (HCC) 02/16/2023  Class 3 obesity 10/30/2022   Acute on chronic systolic CHF (congestive heart failure) (HCC) 10/29/2022   Stress and adjustment reaction 04/08/2022   Gastroesophageal reflux disease 02/16/2022   AV block, 3rd degree (HCC) 10/24/2021   COPD (chronic obstructive pulmonary disease) (HCC) 09/21/2021   Osteoporosis 02/28/2021   Primary osteoarthritis of both knees 01/14/2021   Irritable bowel syndrome with constipation 09/16/2020   Aortic atherosclerosis (HCC) 06/11/2020   Benign lipomatous neoplasm of skin and subcutaneous tissue of left leg 06/11/2020   NSVT (nonsustained ventricular tachycardia) (HCC) 02/14/2020   Chronic constipation 10/06/2019   Genetic testing 08/14/2019   Ductal carcinoma in situ (DCIS) of right breast 06/15/2019    Arthralgia 04/11/2019   Malignant tumor of breast (HCC) 03/06/2019   Fatigue 03/06/2019   DDD (degenerative disc disease), cervical 11/19/2017   Peripheral edema 05/19/2017   Microcytic anemia 05/14/2017   Delayed sleep phase syndrome 10/29/2016   Primary osteoarthritis of left ankle 09/24/2016   Gastrointestinal food sensitivity 05/21/2016   History of arthroplasty of right shoulder 02/24/2016   Bilateral foot pain 01/22/2016   NASH (nonalcoholic steatohepatitis) 05/26/2015    Class: Stage 3   Nasal septal perforation 07/11/2014   Controlled diabetes mellitus type 2 with complications (HCC) 05/25/2014   Physical deconditioning 04/24/2014   Obesity, Class II, BMI 35-39.9, with comorbidity 06/09/2013   OSA (obstructive sleep apnea) 06/08/2013   Chronic systolic heart failure (HCC) 05/23/2013   Asthma, moderate persistent 04/18/2013   Chronic cough 04/18/2013   Breast cancer of upper-inner quadrant of left female breast (HCC) 03/18/2010   Type 2 diabetes mellitus with hyperlipidemia (HCC) 11/07/2009   Fibromyalgia 10/29/2009   Biventricular cardiac pacemaker in situ 10/23/2009   Abnormal levels of other serum enzymes 10/13/2009   Hypothyroidism 10/03/2009   Essential hypertension 07/02/2009   Chronic back pain 07/02/2009    PCP: Linford Arnold   REFERRING PROVIDER: Benjamin Stain  REFERRING DIAG: cervical DDD  THERAPY DIAG:  Cervicalgia  Other symptoms and signs involving the musculoskeletal system  Rationale for Evaluation and Treatment: Rehabilitation  ONSET DATE: 09/10/23  SUBJECTIVE:                                                                                                                                                                                                         SUBJECTIVE STATEMENT:  I have a pain in the back of my skull. She was in the dentist chair for 2.5 hours. My neck and shoulders - I get a twinge once in a while but that's it. I can hold my arms  up now. Getting stuff out of the washing machine still hurts some. It's quick and it goes away.  My hands have been going numb again the past couple of weeks.  EVAL: Pt states she was sick for a while and then returned to her "active seniors" group. She was having difficulty lifting her Lt shoulder when she returned and she saw MD. MD gave her an injection in her Lt shoulder which she states caused her to have neck and Rt shoulder pain. The pain was so bad she had to go to ED who gave her pain medicine. She reports the pain has decreased some but her main complaint is lack of strength in her shoulders and her neck. She states her neck continues with pain with walking "heavy", prolonged standing with looking down. Pt uses a cervical collar that she states she uses very sparingly but it does help Hand dominance: Right  PERTINENT HISTORY:  Rt Total shoulder replacement Pacemaker History of breast cancer with mastectomy Bilateral carpal tunnel  PAIN:  Are you having pain? Yes: NPRS scale: 0 currently, 6/10 at worst Pain location: neck to bilat UEs Pain description: sharp Aggravating factors: prolonged standing with looking down, "jolting" Relieving factors: cervical collar  PRECAUTIONS: ICD/Pacemaker  RED FLAGS: None     WEIGHT BEARING RESTRICTIONS: No  FALLS:  Has patient fallen in last 6 months? Yes. Number of falls multiple - pt states when she has tachycardia she can fall   OCCUPATION: retired  PLOF: Independent  PATIENT GOALS: decrease pain, use her LT UE better  NEXT MD VISIT: 11/18/2023  OBJECTIVE:  Note: Objective measures were completed at Evaluation unless otherwise noted.  DIAGNOSTIC FINDINGS:  X ray performed but not read on Eval  PATIENT SURVEYS:  NDI 21/50 42%  10/28/23 NDI 22 / 50 = 44.0 %  COGNITION: Overall cognitive status: Within functional limits for tasks assessed  SENSATION: Pt reports she occasionally has numbness in her fingers bilat  POSTURE:  rounded shoulders and forward head  PALPATION: Pt TTP throughout cervical musculature, very TTP on cervical spinous processes - unable to tolerate more than light touch with palpation   CERVICAL ROM:   Active ROM A/PROM (deg) eval AROM 10/25/23  Flexion 20 23  Extension 15 20  Right lateral flexion 18 30  Left lateral flexion 11 35  Right rotation 40 50  Left rotation 30 56   (Blank rows = not tested)  UPPER EXTREMITY ROM:  Lt shoulder limited to < 90 degrees flexion and abduction 10/28/23  flex 120 deg sitting; scaption in sitting 135 deg   UPPER EXTREMITY MMT:  MMT Right eval Left eval Right 10/28/23 Left 10/28/23  Shoulder flexion 3 3- 4 4+*  Shoulder extension      Shoulder abduction 3 3- 4+ 4+*  Shoulder adduction      Shoulder extension      Shoulder internal rotation      Shoulder external rotation      Middle trapezius      Lower trapezius      Elbow flexion      Elbow extension      Wrist flexion      Wrist extension      Wrist ulnar deviation      Wrist radial deviation      Wrist pronation      Wrist supination      Grip strength       (Blank rows = not tested)    TREATMENT DATE:  [facility] Adult PT Treatment:                                                DATE: 10/28/2023 Therapeutic Activity: NDI, ROM, goals assessed Therapeutic Exercise: Standing:  Shoulder shrugs/depression + 1#DB Shoulder flexion & scaption to 90* + 1#DB x10 each Standing:  Shoulder extension + RTB x10 Rows + GTB 2x10 Neuromuscular re-ed: Side Lying: (muscular activation & scapulohumeral rhythm cueing) Shoulder ER + 1#DB x 20 Shoulder abd + tactile cues for scap MWM Shoulder flexion to 90* Alt shoulder flexion <--> extension  TREATMENT DATE:                                                                                                                               [facility] Adult PT Treatment:                                                DATE: 10/25/2023 Therapeutic Exercise: Standing:  Green PB roll out shoulder stretches: flexion & scaption Shoulder shrugs/depression + 1#DB Shoulder flexion & scaption to 90* + 1#DB x10 each Seated neck stretches: UT/LS Seated scap squeezes Standing:  Shoulder extension + RTB x10 Rows + GTB 2x10 Neuromuscular re-ed: Side Lying: (muscular activation & scapulohumeral rhythm cueing) Shoulder ER + 1#DB Shoulder abd + tactile cues for scap MWM Shoulder flexion to 90* Alt shoulder flexion <--> extension Bent arm elbow circles --> scap MWM    [facility] Adult PT Treatment:                                                DATE: 10/21/2023 Therapeutic Exercise: Seated: Red PB roll out for shoulder flexion & thoracolumbar stretch Shoulder pulleys: flexion & scaption x 2 min each Seated with noodle:  Bkwd shoulder circles Shoulder shrugs/depression holding 1#DB x10 Bicep curls + 1#DB, then unilaterally with 2# 2x 5 Seated lat pull GTB 2 x 10 Shoulder flexion (thigh to 90*) x 10 cues to keep shoulder down Standing: cabinet reaches 2x5 to second shelf  Low trap lift off x 3 very difficult Seated resisted rows + RTB 2x10 Seated neck stretching and scapular retraction Manual Therapy: Seated: passive scapular ROM for relaxation of UT and levator    [facility] Adult PT Treatment:                                                DATE:  10/18/2023 Therapeutic Exercise: Seated: Red PB roll out for shoulder flexion & thoracolumbar stretch Shoulder pulleys: flexion & scaption x 2 min each Seated with noodle:  Bkwd shoulder circles Shoulder shrugs/depression holding 1#DB x10 Bicep curls + 1#DB Shoulder flexion (thigh to 90*) 2x5 (alt UE) Standing: wall slides with towel --> flexion, scaption, abduction Seated resisted rows + RTB Manual Therapy: Seated: STM L UT, LS, rhomboid, infraspinatus, deltoid, cervical paraspinals; gentle  trigger point release L LS   PATIENT EDUCATION:  Education details: rationale for interventions, HEP  Person educated: Patient Education method: Explanation, Demonstration, Tactile cues, Verbal cues Education comprehension: verbalized understanding, returned demonstration, verbal cues required, tactile cues required, and needs further education     HOME EXERCISE PROGRAM: Access Code: [email or web address] URL: [email or web address] Date: 10/18/2023 Prepared by: Carlynn Herald  Exercises - Seated Cervical Retraction  - 1 x daily - 7 x weekly - 2 sets - 10 reps - Seated Scapular Retraction  - 1 x daily - 7 x weekly - 3 sets - 10 reps - Gentle Levator Scapulae Stretch  - 1 x daily - 7 x weekly - 1 sets - 10 reps - 10 seconds hold - Seated Upper Trapezius Stretch  - 1 x daily - 7 x weekly - 1 sets - 10 reps - 10 seconds hold - Seated Shoulder Shrug  - 1 x daily - 7 x weekly - 3 sets - 10 reps - Seated Shoulder Shrug Circles AROM Backward  - 1 x daily - 7 x weekly - 3 sets - 10 reps - Seated Bilateral Shoulder Flexion Towel Slide at Table Top  - 1 x daily - 7 x weekly - 1 sets - 10 reps - 10 seconds hold - Seated Shoulder Row with Resistance Anchored at Feet  - 1 x daily - 7 x weekly - 3 sets - 10 reps  ASSESSMENT:  CLINICAL IMPRESSION:  Tanya Hogan is making good progress toward her LTGs. She is able to raise her left arm overhead and hold it which she could not do before. Good progression measured with cervical AROM in all directions, most notably with cervical rotation and lateral flexion. She is still limited functionally and continues to have strength deficits. Her NDI has actually worsened since evaluation although she reports improved function with laundry and other ADLS. She continues to experience short quick pains, but nothing prolonged. She continues to demonstrate potential for improvement and would benefit from continued skilled therapy to address impairments.     OBJECTIVE  IMPAIRMENTS: decreased activity tolerance, decreased ROM, decreased strength, increased muscle spasms, and pain.    GOALS: Goals reviewed with patient? Yes  SHORT TERM GOALS: Target date: 10/26/2023  Pt will be independent in initial HEP Baseline:  Goal status: MET  2.  Pt will improve cervical rotation ROM by 10 degrees bilat Baseline: see above Goal status: MET    LONG TERM GOALS: Target date: 11/23/2023  Pt will be independent with advanced HEP Baseline:  Goal status: PARTIALLY MET  2.  Pt will improve NDI to <= 22% to demo improved functional mobility Baseline:  42% Goal status: IN PROGRESS  3.  Pt will report being able to wash dishes x 10 minutes with pain <= 2/10 Baseline:  Goal status: IN PROGRESS - hasn't tried  4.  Pt will be able to fully return to senior exercise class with pain < = 2/10 Baseline:  Goal status: IN PROGRESS    PLAN:  PT FREQUENCY: 2x/week  PT DURATION:  8 weeks  PLANNED INTERVENTIONS: 97164- PT Re-evaluation, 97110-Therapeutic exercises, 97530- Therapeutic activity, [id number]- Neuromuscular re-education, 97535- Self Care, [id number]- Manual therapy, [phone]- Aquatic Therapy, Patient/Family education, Taping, Dry Needling, Cryotherapy, and Moist heat  PLAN FOR NEXT SESSION:  Continue cervical mobility, postural and scapular strength    Solon Palm, PT  10/28/2023 11:35 AM

## 2023-10-27 NOTE — Progress Notes (Signed)
   10/27/2023  Patient ID: Tanya Hogan, female   DOB: [birth year], 73 y.o.   MRN: [id number]  Received telephone call from patient yesterday stating that she is receiving freestyle libre sensors every 2 to 4 weeks, and these are only a 1 month supply.  She also has not familiar with the company on the return address and would like to make sure these are valid refills for her.  Patient was able to provide me with the return address where sensors have been coming from.  Upon investigating, this is a fulfillment center for [employer] where her [name] sensor orders are filled through.  I contacted the specific DME supplier that fills her sensors, and they state that her profile has not been updated which is why they are only sending a 1 month supply at a time.  Profile has now been updated and patient can receive a 44-month supply each refill moving forward.  Patient will need to contact [facility] supply when next refill is needed.  Contacted patient to provide this information, and she states she currently has about 5 months of sensors on hand.  She will let me know when she gets down to about a 1 month supply, so [facility] can get a refill in process for her.  Lenna Gilford, PharmD, DPLA

## 2023-10-28 ENCOUNTER — Encounter: Payer: Self-pay | Admitting: Physical Therapy

## 2023-10-28 ENCOUNTER — Ambulatory Visit: Payer: 59 | Admitting: Physical Therapy

## 2023-10-28 DIAGNOSIS — M542 Cervicalgia: Secondary | ICD-10-CM | POA: Diagnosis not present

## 2023-10-28 DIAGNOSIS — R29898 Other symptoms and signs involving the musculoskeletal system: Secondary | ICD-10-CM | POA: Diagnosis not present

## 2023-10-30 ENCOUNTER — Ambulatory Visit
Admission: EM | Admit: 2023-10-30 | Discharge: 2023-10-30 | Disposition: A | Discharge status: Home / Self Care | Attending: Family Medicine | Admitting: Family Medicine

## 2023-10-30 ENCOUNTER — Other Ambulatory Visit: Payer: Self-pay

## 2023-10-30 ENCOUNTER — Ambulatory Visit (INDEPENDENT_AMBULATORY_CARE_PROVIDER_SITE_OTHER)

## 2023-10-30 DIAGNOSIS — M19072 Primary osteoarthritis, left ankle and foot: Secondary | ICD-10-CM | POA: Diagnosis not present

## 2023-10-30 DIAGNOSIS — M85872 Other specified disorders of bone density and structure, left ankle and foot: Secondary | ICD-10-CM | POA: Diagnosis not present

## 2023-10-30 DIAGNOSIS — S92355A Nondisplaced fracture of fifth metatarsal bone, left foot, initial encounter for closed fracture: Secondary | ICD-10-CM

## 2023-10-30 DIAGNOSIS — S92352A Displaced fracture of fifth metatarsal bone, left foot, initial encounter for closed fracture: Secondary | ICD-10-CM | POA: Diagnosis not present

## 2023-10-30 NOTE — ED Provider Notes (Signed)
 [facility]    CSN: [id number] Arrival date & time: 10/30/23  1405      History   Chief Complaint Chief Complaint  Patient presents with   Foot Injury    HPI TAKIYAH BOHNSACK is a 73 y.o. female.   HPI 73 year old female presents with left foot injury.  Patient reports tripping over her Arenz after waking this morning.  PMH significant for obesity, chronic heart failure, fibromyalgia, and HTN.  Past Medical History:  Diagnosis Date   Alkaline phosphatase elevation    Asthma    Breast cancer (HCC) 2011   Carpal tunnel syndrome    bilateral   Chronic HFrEF (heart failure with reduced ejection fraction) (HCC)    Cirrhosis, non-alcoholic (HCC)    Closed fracture of unspecified part of radius (alone)    DDD (degenerative disc disease), lumbar    Depression    pt denies   Diabetes mellitus without complication (HCC)    Enlarged heart    Fibromyalgia    GERD (gastroesophageal reflux disease)    History of kidney stones    HTN (hypertension)    Hypothyroidism    IBS (irritable bowel syndrome)    LBBB (left bundle branch block)    Microcytic anemia 05/14/2017   Mitral regurgitation    NICM (nonischemic cardiomyopathy) (HCC)    Pneumonia    Presence of permanent cardiac pacemaker    Pulmonary hypertension (HCC)    PVD (peripheral vascular disease) (HCC)    2019   Sleep apnea    uses cpap   Tricuspid regurgitation    Ventricular tachyarrhythmia [facility])     Patient Active Problem List   Diagnosis Date Noted   Presence of heart assist device (HCC) 10/07/2023   Primary osteoarthritis of left shoulder 09/08/2023   Injection site reaction 05/17/2023   Nonischemic cardiomyopathy (HCC) 02/16/2023   Class 3 obesity 10/30/2022   Acute on chronic systolic CHF (congestive heart failure) (HCC) 10/29/2022   Stress and adjustment reaction 04/08/2022   Gastroesophageal reflux disease 02/16/2022   AV block, 3rd degree (HCC) 10/24/2021   COPD (chronic obstructive  pulmonary disease) (HCC) 09/21/2021   Osteoporosis 02/28/2021   Primary osteoarthritis of both knees 01/14/2021   Irritable bowel syndrome with constipation 09/16/2020   Aortic atherosclerosis (HCC) 06/11/2020   Benign lipomatous neoplasm of skin and subcutaneous tissue of left leg 06/11/2020   NSVT (nonsustained ventricular tachycardia) (HCC) 02/14/2020   Chronic constipation 10/06/2019   Genetic testing 08/14/2019   Ductal carcinoma in situ (DCIS) of right breast 06/15/2019   Arthralgia 04/11/2019   Malignant tumor of breast (HCC) 03/06/2019   Fatigue 03/06/2019   DDD (degenerative disc disease), cervical 11/19/2017   Peripheral edema 05/19/2017   Microcytic anemia 05/14/2017   Delayed sleep phase syndrome 10/29/2016   Primary osteoarthritis of left ankle 09/24/2016   Gastrointestinal food sensitivity 05/21/2016   History of arthroplasty of right shoulder 02/24/2016   Bilateral foot pain 01/22/2016   NASH (nonalcoholic steatohepatitis) 05/26/2015    Class: Stage 3   Nasal septal perforation 07/11/2014   Controlled diabetes mellitus type 2 with complications (HCC) 05/25/2014   Physical deconditioning 04/24/2014   Obesity, Class II, BMI 35-39.9, with comorbidity 06/09/2013   OSA (obstructive sleep apnea) 06/08/2013   Chronic systolic heart failure (HCC) 05/23/2013   Asthma, moderate persistent 04/18/2013   Chronic cough 04/18/2013   Breast cancer of upper-inner quadrant of left female breast (HCC) 03/18/2010   Type 2 diabetes mellitus with hyperlipidemia (HCC) 11/07/2009  Fibromyalgia 10/29/2009   Biventricular cardiac pacemaker in situ 10/23/2009   Abnormal levels of other serum enzymes 10/13/2009   Hypothyroidism 10/03/2009   Essential hypertension 07/02/2009   Chronic back pain 07/02/2009    Past Surgical History:  Procedure Laterality Date   BIV PACEMAKER GENERATOR CHANGEOUT N/A 11/03/2021   Procedure: BIV PACEMAKER GENERATOR CHANGEOUT;  Surgeon: Marinus Maw, MD;   Location: [facility];  Service: Cardiovascular;  Laterality: N/A;   BREAST LUMPECTOMY Left 2011   COLONOSCOPY     FOOT SURGERY     KNEE SURGERY     LEAD REVISION/REPAIR N/A 11/03/2021   Procedure: LEAD REVISION/REPAIR;  Surgeon: Marinus Maw, MD;  Location: [facility] CV [facility];  Service: Cardiovascular;  Laterality: N/A;   MASTECTOMY Right    05/2019   PACEMAKER GENERATOR CHANGE N/A 08/23/2014   Procedure: PACEMAKER GENERATOR CHANGE;  Surgeon: Marinus Maw, MD;  Location: [facility] CATH LAB;  Service: Cardiovascular;  Laterality: N/A;   PACEMAKER PLACEMENT  2009   [facility]   RECTAL SURGERY  1976   REDUCTION MAMMAPLASTY Left    05/2019   TOTAL SHOULDER ARTHROPLASTY Right 03/10/2018   TOTAL SHOULDER ARTHROPLASTY Right 03/10/2018   Procedure: RIGHT TOTAL SHOULDER ARTHROPLASTY;  Surgeon: Jones Broom, MD;  Location: [facility] OR;  Service: Orthopedics;  Laterality: Right;   TUBAL LIGATION      OB History   No obstetric history on file.      Home Medications    Prior to Admission medications   Medication Sig Start Date End Date Taking? Authorizing Provider  albuterol (PROVENTIL) (2.5 MG/3ML) 0.083% nebulizer solution Take 2.5 mg by nebulization every 6 (six) hours as needed for wheezing or shortness of breath.    [provider]  albuterol (VENTOLIN HFA) 108 (90 Base) MCG/ACT inhaler Inhale 1-2 puffs into the lungs every 6 (six) hours as needed for wheezing or shortness of breath. 05/27/21   [name], MD  alendronate (FOSAMAX) 70 MG tablet TAKE 1 TABLET BY MOUTH EVERY 7  DAYS WITH FULL GLASS OF WATER ON AN EMPTY STOMACH 08/13/23   [name], MD  AMBULATORY NON FORMULARY MEDICATION Medication Name: Tubing and supplies for nebulizer. Fax - [phone] 08/14/19   [name], MD  amiodarone (PACERONE) 200 MG tablet Take 1 tablet (200 mg total) by mouth daily. 08/11/23   Lewayne Bunting, MD  aspirin 81 MG chewable tablet Chew 81  mg by mouth daily.    [provider]  Benzoyl Peroxide (PANOXYL FOAMING WASH) 10 % LIQD Wash groin creases daily with this wash  Ok to sub something similar 04/28/23   [name], MD  carvedilol (COREG) 3.125 MG tablet Take 1 tablet (3.125 mg total) by mouth 2 (two) times daily. 10/04/23 01/02/24  Lewayne Bunting, MD  Continuous Glucose Receiver (FREESTYLE LIBRE 3 READER) DEVI Use for continuous glucose monitoring 03/22/23   [name], MD  Continuous Glucose Sensor (FREESTYLE LIBRE 3 SENSOR) MISC by Does not apply route. Change every 14 days    [provider]  EPINEPHrine 0.3 mg/0.3 mL IJ SOAJ injection Inject 0.3 mg into the muscle as needed for anaphylaxis. 08/27/23   [name], MD  fexofenadine (ALLEGRA) 60 MG tablet Take 1 tablet (60 mg total) by mouth 2 (two) times daily. 08/14/23   Alvira Monday, MD  fluticasone-salmeterol [facility] INHUB) 250-50 MCG/ACT AEPB Inhale 1 puff into the lungs in the morning and at bedtime.  [provider]  glucose blood test strip To be used to check blood glucose Dx:E11.8 FreeStyle Precision blood glucose for Freestyle Libre 2 08/25/22   [name], MD  hydrALAZINE (APRESOLINE) 10 MG tablet Take 1 tablet (10 mg total) by mouth in the morning and at bedtime. 10/04/23   Lewayne Bunting, MD  isosorbide mononitrate (IMDUR) 30 MG 24 hr tablet Take 1 tablet (30 mg total) by mouth daily. 10/04/23   Lewayne Bunting, MD  levothyroxine (SYNTHROID) 112 MCG tablet Take 1 tablet (112 mcg total) by mouth daily. 10/04/23   [name], MD  lubiprostone (AMITIZA) 24 MCG capsule Take 24 mcg by mouth 2 (two) times daily with a meal. Patient reports provider has decreased to every three days.    [provider]  meclizine (ANTIVERT) 25 MG tablet Take 1 tablet (25 mg total) by mouth 3 (three) times daily as needed for dizziness. 08/14/23   Alvira Monday, MD  metFORMIN (GLUCOPHAGE-XR) 500 MG 24  hr tablet TAKE 1 TABLET BY MOUTH TWICE  DAILY WITH A MEAL 08/13/23   [name], MD  Multiple Vitamins-Minerals (MULTIPLE VITAMINS/WOMENS PO) Take 1 tablet by mouth daily.    [provider]  Multiple Vitamins-Minerals (OCUVITE EYE + MULTI) TABS Take 1 tablet by mouth in the morning.    [provider]  pantoprazole (PROTONIX) 40 MG tablet TAKE 1 TABLET BY MOUTH DAILY  BEFORE BREAKFAST Patient taking differently: Take 40 mg by mouth 2 (two) times daily before a meal. 10/20/22   [name], MD  pravastatin (PRAVACHOL) 40 MG tablet Take 1 tablet (40 mg total) by mouth at bedtime. 10/07/23   [name], MD  Tiotropium Bromide Monohydrate (SPIRIVA RESPIMAT) 1.25 MCG/ACT AERS Inhale 2 puffs into the lungs daily.    [provider]  torsemide (DEMADEX) 20 MG tablet Take 1 tablet (20 mg total) by mouth daily. 10/04/23   Lewayne Bunting, MD  valACYclovir (VALTREX) 1000 MG tablet Take 1 tablet (1,000 mg total) by mouth 2 (two) times daily. 05/17/23   Everrett Coombe, DO    Family History Family History  Problem Relation Age of Onset   Heart disease Mother    Melanoma Mother    Parkinson's disease Mother    Heart disease Father    Heart disease Brother    Heart failure Other    Breast cancer Other    Hypertension Other     Social History Social History   Tobacco Use   Smoking status: Former    Current packs/day: 0.00    Types: Cigarettes    Quit date: 08/24/1966    Years since quitting: 57.2   Smokeless tobacco: Never  Vaping Use   Vaping status: Never Used  Substance Use Topics   Alcohol use: No   Drug use: No     Allergies   Injectafer [ferric carboxymaltose], Penicillins, Accupril [quinapril hcl], Aleve [naproxen], Basle, Celebrex [celecoxib], Cozaar [losartan potassium], Flonase [fluticasone], Lactose intolerance (gi), Levomenol, Lipitor [atorvastatin], Nasonex [mometasone furoate], Xyzal [levocetirizine], Bentyl  [dicyclomine], Chamomile, Entresto [sacubitril-valsartan], Singulair [montelukast], Vesicare [solifenacin], Aspirin, Baking soda-fluoride [sodium fluoride], Clindamycin/lincomycin, Codeine, Furosemide, Lactose, Lanolin-petrolatum, Miralax [polyethylene glycol], Quinine, Reclast [zoledronic acid], Rifampin, Sulfa antibiotics, Sulfonamide derivatives, Tramadol, Wellbutrin [bupropion], and Wool alcohol [lanolin]   Review of Systems Review of Systems   Physical Exam Triage Vital Signs ED Triage Vitals  Encounter Vitals Group     BP      Systolic BP Percentile  Diastolic BP Percentile      Pulse      Resp      Temp      Temp src      SpO2      Weight      Height      Head Circumference      Peak Flow      Pain Score      Pain Loc      Pain Education      Exclude from Growth Chart    No data found.  Updated Vital Signs BP 127/74   Pulse 78   Temp 97.8 F (36.6 C)   Resp 19   SpO2 94%    Physical Exam Vitals and nursing note reviewed.  Constitutional:      Appearance: Normal appearance. She is obese.  HENT:     Head: Normocephalic and atraumatic.     Mouth/Throat:     Mouth: Mucous membranes are moist.     Pharynx: Oropharynx is clear.  Eyes:     Extraocular Movements: Extraocular movements intact.     Conjunctiva/sclera: Conjunctivae normal.     Pupils: Pupils are equal, round, and reactive to light.  Cardiovascular:     Rate and Rhythm: Normal rate and regular rhythm.     Pulses: Normal pulses.     Heart sounds: Normal heart sounds.  Pulmonary:     Effort: Pulmonary effort is normal.     Breath sounds: Normal breath sounds. No wheezing, rhonchi or rales.  Musculoskeletal:        General: Normal range of motion.     Cervical back: Normal range of motion and neck supple.     Comments: Patient has pain with bearing weight while using her rolling Kabat  Skin:    General: Skin is warm and dry.  Neurological:     General: No focal deficit present.      Mental Status: She is alert and oriented to person, place, and time. Mental status is at baseline.  Psychiatric:        Mood and Affect: Mood normal.        Behavior: Behavior normal.      UC Treatments / Results  Labs (all labs ordered are listed, but only abnormal results are displayed) Labs Reviewed - No data to display  EKG   Radiology DG Foot Complete Left Result Date: 10/30/2023 CLINICAL DATA:  injury EXAM: LEFT FOOT - COMPLETE 3+ VIEW COMPARISON:  None Available. FINDINGS: Osteopenia. There is a nondisplaced fracture of the base of the fifth metatarsal. There is associated soft tissue edema. Scattered midfoot degenerative changes. IMPRESSION: Nondisplaced fracture of the base of the fifth metatarsal. Electronically Signed   By: Meda Klinefelter M.D.   On: 10/30/2023 15:25    Procedures Procedures (including critical care time)  Medications Ordered in UC Medications - No data to display  Initial Impression / Assessment and Plan / UC Course  I have reviewed the triage vital signs and the nursing notes.  Pertinent labs & imaging results that were available during my care of the patient were reviewed by me and considered in my medical decision making (see chart for details).     MDM: 1.  Closed nondisplaced fracture of fifth metatarsal bone of left foot, initial encounter-left CAM Mustin boot placed on patient prior to discharge patient taken by wheelchair to car with daughter driving this afternoon.  Patient discharged home, hemodynamically stable. Final Clinical Impressions(s) / UC Diagnoses  Final diagnoses:  Closed nondisplaced fracture of fifth metatarsal bone of left foot, initial encounter     Discharge Instructions      Advised patient to follow-up with [facility] first thing Monday morning, 11/01/2023.     ED Prescriptions   None    PDMP not reviewed this encounter.   Trevor Iha, FNP 10/30/23 1555

## 2023-10-30 NOTE — ED Triage Notes (Signed)
 Pt presents to [facility] with co of left foot and ankle pain since this morning when she was walking down her hallway this morning and got tripped upon on her [place]

## 2023-10-30 NOTE — Discharge Instructions (Addendum)
 Advised patient to follow-up with [facility] first thing Monday morning, 11/01/2023.

## 2023-11-01 ENCOUNTER — Ambulatory Visit: Payer: 59

## 2023-11-02 ENCOUNTER — Ambulatory Visit (INDEPENDENT_AMBULATORY_CARE_PROVIDER_SITE_OTHER): Admitting: Sports Medicine

## 2023-11-02 ENCOUNTER — Telehealth: Payer: Self-pay | Admitting: *Deleted

## 2023-11-02 ENCOUNTER — Other Ambulatory Visit: Payer: Self-pay | Admitting: Sports Medicine

## 2023-11-02 ENCOUNTER — Ambulatory Visit

## 2023-11-02 ENCOUNTER — Encounter: Payer: Self-pay | Admitting: Sports Medicine

## 2023-11-02 ENCOUNTER — Ambulatory Visit (INDEPENDENT_AMBULATORY_CARE_PROVIDER_SITE_OTHER): Payer: Medicare Other

## 2023-11-02 DIAGNOSIS — S92351A Displaced fracture of fifth metatarsal bone, right foot, initial encounter for closed fracture: Secondary | ICD-10-CM

## 2023-11-02 DIAGNOSIS — Z13820 Encounter for screening for osteoporosis: Secondary | ICD-10-CM

## 2023-11-02 DIAGNOSIS — M810 Age-related osteoporosis without current pathological fracture: Secondary | ICD-10-CM

## 2023-11-02 DIAGNOSIS — S92902D Unspecified fracture of left foot, subsequent encounter for fracture with routine healing: Secondary | ICD-10-CM | POA: Diagnosis not present

## 2023-11-02 DIAGNOSIS — I428 Other cardiomyopathies: Secondary | ICD-10-CM | POA: Diagnosis not present

## 2023-11-02 DIAGNOSIS — S92352A Displaced fracture of fifth metatarsal bone, left foot, initial encounter for closed fracture: Secondary | ICD-10-CM

## 2023-11-02 DIAGNOSIS — M7732 Calcaneal spur, left foot: Secondary | ICD-10-CM | POA: Diagnosis not present

## 2023-11-02 DIAGNOSIS — S92352D Displaced fracture of fifth metatarsal bone, left foot, subsequent encounter for fracture with routine healing: Secondary | ICD-10-CM | POA: Diagnosis not present

## 2023-11-02 MED ORDER — CALCIUM 600+D PLUS MINERALS 600-400 MG-UNIT PO TABS: ORAL_TABLET | ORAL | 3 refills | Status: DC

## 2023-11-02 NOTE — Telephone Encounter (Signed)
 Pt stated that "someone" called but she is unsure of who this was that called her. She stated that they would call her back in 2 weeks. She stated that 1 week has passed since this call came to her.   The referral, clinical notes, and copies of insurance cards have been faxed to [facility] [place], [place] at [phone]. The office will contact the patient to schedule a referral appointment.

## 2023-11-02 NOTE — Telephone Encounter (Signed)
-----   Message from [facility] Buckner S sent at 11/01/2023 11:05 AM EDT -----  Have you seen this home health order ----- Message ----- From: [name], MD Sent: 10/25/2023   4:30 PM EDT To: [facility]  Can we check on this referral ----- Message ----- From: Kennith Gain Sent: 10/25/2023  11:01 AM EST To: [name], MD  Good Morning Dr. Linford Arnold, I spoke to the patient above and and she has not heard from anyone regarding the HHA referral that was made. She asked if the nurse or someone from your office could call her to tell her where the referral was made  Jeanie Cooks, PhD [facility], [facility] Social Worker Direct Dial: [phone]  Fax:[phone] ----- Message ----- From: [name], MD Sent: 09/22/2023   1:48 PM EST To: Lulu Riding, I sent sorry for the delay in getting back to you but I did want to let you know I did place a home health referral on January 15 in case you want to follow back up with her and make sure that she was able to receive the services that we ordered. ----- Message ----- From: Remigio Eisenmenger D Sent: 09/20/2023   3:17 PM EST To: [name], MD  Good Afternoon Dr. Linford Arnold, I spoke with the patient above on today and she stated that she was good with getting a limited time or whatever she can get, but she is also going to call her insurance [employer] to see if they will cover also. I will let you know what she finds out. ----- Message ----- From: [name], MD Sent: 09/07/2023   7:46 AM EST To: Kennith Gain  If she needs home health with nurse and PT for a limited time, we can order but if she is wanting an aid she will need to check with her insurance. That is usually private pay. We have been unsuccessful over the years in getting aids covered by insurance and medicare.  Unless you have some tips. ----- Message ----- From:  Kennith Gain Sent: 09/06/2023   5:17 PM EST To: [name], MD  Yes for a Home Health Aid and Thank you, once the order is written will the nurse send it out to a couple of agencies? ----- Message ----- From: [name], MD Sent: 09/06/2023   4:38 PM EST To: Lulu Riding. Just want to be sure, is HHA, home health referral?  I am happy to do so if she would like.  We have referred before for her but I think has been awhile. ----- Message ----- From: Remigio Eisenmenger D Sent: 09/06/2023   3:31 PM EST To: [name], MD  Good Afternoon Dr. Linford Arnold,  The patient listed above was referred to me at [facility], she is requesting a HHA and I told her that I needed to reach out to you to see if an order has ever been written or if one can be done, and she also wanted a nurse to call her after it is written (if done) to let her know the next steps.   Jeanie Cooks, PhD [facility], [facility] Social Worker Direct Dial: [phone]  [phone]

## 2023-11-02 NOTE — Progress Notes (Signed)
    Procedures performed today:    None.  Independent interpretation of notes and tests performed by another provider:   X-rays personally viewed, I do see a nondisplaced fracture base of the fifth metatarsal  Brief History, Exam, Impression, and Recommendations:    Fracture of base of fifth metatarsal bone of right foot Recent inversion injury left foot, seen in urgent care, x-rays did show a nondisplaced fracture base of the fifth metatarsal. Placed in a boot and referred to me, on exam she does have significant swelling, she is in the boot which is quite loose. She is neurovascular intact distally, I have tightened the boot. We will add calcium and vitamin D supplementation. Updated x-rays.  Osteoporosis Bone density test summer 2024 does confirm osteoporosis at multiple sites. She is currently on Fosamax, ensuring she is getting appropriate calcium and vitamin D.    ____________________________________________ Ihor Austin. Benjamin Stain, M.D., ABFM., CAQSM., AME. Primary Care and Sports Medicine [facility]  Adjunct Professor of Family Medicine  [place] [employer]  Restaurant manager, fast food

## 2023-11-02 NOTE — Assessment & Plan Note (Signed)
 Bone density test summer 2024 does confirm osteoporosis at multiple sites. She is currently on Fosamax, ensuring she is getting appropriate calcium and vitamin D.

## 2023-11-02 NOTE — Assessment & Plan Note (Addendum)
 Recent inversion injury left foot, seen in urgent care, x-rays did show a nondisplaced fracture base of the fifth metatarsal. Placed in a boot and referred to me, on exam she does have significant swelling, she is in the boot which is quite loose. She is neurovascular intact distally, I have tightened the boot. We will add calcium and vitamin D supplementation. Updated x-rays.

## 2023-11-03 LAB — CUP PACEART REMOTE DEVICE CHECK
Battery Remaining Longevity: 64 mo
Battery Voltage: 3 V
Brady Statistic AP VP Percent: 49.21 %
Brady Statistic AP VS Percent: 0.17 %
Brady Statistic AS VP Percent: 45.77 %
Brady Statistic AS VS Percent: 4.86 %
Brady Statistic RA Percent Paced: 52.44 %
Brady Statistic RV Percent Paced: 94.98 %
Date Time Interrogation Session: 20250311035343
Implantable Lead Connection Status: 753985
Implantable Lead Connection Status: 753985
Implantable Lead Connection Status: 753985
Implantable Lead Implant Date: 20030324
Implantable Lead Implant Date: 20030324
Implantable Lead Implant Date: 20230313
Implantable Lead Location: 753858
Implantable Lead Location: 753859
Implantable Lead Location: 753860
Implantable Lead Model: 3830
Implantable Lead Model: 4068
Implantable Lead Model: 4193
Implantable Pulse Generator Implant Date: 20230313
Lead Channel Impedance Value: 3306 Ohm
Lead Channel Impedance Value: 3306 Ohm
Lead Channel Impedance Value: 3306 Ohm
Lead Channel Impedance Value: 380 Ohm
Lead Channel Impedance Value: 418 Ohm
Lead Channel Impedance Value: 456 Ohm
Lead Channel Impedance Value: 475 Ohm
Lead Channel Impedance Value: 855 Ohm
Lead Channel Impedance Value: 969 Ohm
Lead Channel Pacing Threshold Amplitude: 0.875 V
Lead Channel Pacing Threshold Amplitude: 2.125 V
Lead Channel Pacing Threshold Pulse Width: 0.4 ms
Lead Channel Pacing Threshold Pulse Width: 0.8 ms
Lead Channel Sensing Intrinsic Amplitude: 1.5 mV
Lead Channel Sensing Intrinsic Amplitude: 1.5 mV
Lead Channel Sensing Intrinsic Amplitude: 9.375 mV
Lead Channel Sensing Intrinsic Amplitude: 9.375 mV
Lead Channel Setting Pacing Amplitude: 2.5 V
Lead Channel Setting Pacing Amplitude: 2.5 V
Lead Channel Setting Pacing Amplitude: 3 V
Lead Channel Setting Pacing Pulse Width: 0.4 ms
Lead Channel Setting Pacing Pulse Width: 0.8 ms
Lead Channel Setting Sensing Sensitivity: 2.8 mV
Zone Setting Status: 755011
Zone Setting Status: 755011

## 2023-11-04 ENCOUNTER — Telehealth: Payer: Self-pay

## 2023-11-04 ENCOUNTER — Ambulatory Visit: Payer: 59 | Admitting: Sports Medicine

## 2023-11-04 ENCOUNTER — Ambulatory Visit: Payer: 59 | Admitting: Physical Therapy

## 2023-11-04 ENCOUNTER — Encounter: Payer: Self-pay | Admitting: Internal Medicine

## 2023-11-04 NOTE — Telephone Encounter (Signed)
 Pt advised.

## 2023-11-04 NOTE — Telephone Encounter (Signed)
 Referral, clinical notes, demographics and copies of insurance cards have been faxed to [facility] at [phone]. Office will contact patient to schedule referral appointment.

## 2023-11-04 NOTE — Telephone Encounter (Signed)
 Alert received from [employer] for 3 VHR detections, longest reported at 1 hr 12 min on 10/21/23.  Other v. sensing episodes are consistent with slow VT, longest 11 sec at max 182 bpm.  HF diagnostics currently abnormal, ongoing throughout monitoring period. Routing to triage for episodes of sustained VT , longest > 1 hour per protocol.  Patient reports increased intermittent dizziness over the past few weeks (unable to recall specific dates). Patient reports taking amiodrone 100 mg daily per Dr. Jens Som (unable to locate note) although MAR states 200 mg daily. Is taking coreg 1.325 mg BID. Has f/u with Mardelle Matte T., PA 12/09/23.   Routing to Dr. Ladona Ridgel to see if meds need adjusted or move EP OV up sooner.   Patient advised to call if symptoms worsening/ED precautions given with verbal understanding.

## 2023-11-04 NOTE — Telephone Encounter (Signed)
 Increase amio to 200 mg daily

## 2023-11-04 NOTE — Telephone Encounter (Signed)
 Spoke w/Tosha I regarding this and she is going to look into this referral.

## 2023-11-08 ENCOUNTER — Ambulatory Visit: Payer: 59

## 2023-11-08 ENCOUNTER — Telehealth: Payer: Self-pay

## 2023-11-08 DIAGNOSIS — M542 Cervicalgia: Secondary | ICD-10-CM | POA: Diagnosis not present

## 2023-11-08 DIAGNOSIS — R29898 Other symptoms and signs involving the musculoskeletal system: Secondary | ICD-10-CM

## 2023-11-08 NOTE — Telephone Encounter (Signed)
 Left voicemail for patient about missed message and confirmed next appointment on Thurs 11/11/23.  Carlynn Herald, PTA 11/08/2023 9:09 AM

## 2023-11-08 NOTE — Therapy (Signed)
 OUTPATIENT PHYSICAL THERAPY CERVICAL TREATMENT   Patient Name: Tanya Hogan MRN: [id number] DOB:[birth year], 73 y.o., female Today's Date: 11/08/2023  END OF SESSION:  PT End of Session - 11/08/23 0928     Visit Number 11    Number of Visits 16    Date for PT Re-Evaluation 11/23/23    Authorization Type [employer] Medicare    PT Start Time 0930    PT Stop Time 1008    PT Time Calculation (min) 38 min    Activity Tolerance Patient tolerated treatment well    Behavior During Therapy WFL for tasks assessed/performed            Past Medical History:  Diagnosis Date   Alkaline phosphatase elevation    Asthma    Breast cancer (HCC) 2011   Carpal tunnel syndrome    bilateral   Chronic HFrEF (heart failure with reduced ejection fraction) (HCC)    Cirrhosis, non-alcoholic (HCC)    Closed fracture of unspecified part of radius (alone)    DDD (degenerative disc disease), lumbar    Depression    pt denies   Diabetes mellitus without complication (HCC)    Enlarged heart    Fibromyalgia    GERD (gastroesophageal reflux disease)    History of kidney stones    HTN (hypertension)    Hypothyroidism    IBS (irritable bowel syndrome)    LBBB (left bundle branch block)    Microcytic anemia 05/14/2017   Mitral regurgitation    NICM (nonischemic cardiomyopathy) (HCC)    Pneumonia    Presence of permanent cardiac pacemaker    Pulmonary hypertension (HCC)    PVD (peripheral vascular disease) (HCC)    2019   Sleep apnea    uses cpap   Tricuspid regurgitation    Ventricular tachyarrhythmia [facility])    Past Surgical History:  Procedure Laterality Date   BIV PACEMAKER GENERATOR CHANGEOUT N/A 11/03/2021   Procedure: BIV PACEMAKER GENERATOR CHANGEOUT;  Surgeon: Marinus Maw, MD;  Location: [facility];  Service: Cardiovascular;  Laterality: N/A;   BREAST LUMPECTOMY Left 2011   COLONOSCOPY     FOOT SURGERY     KNEE SURGERY     LEAD REVISION/REPAIR N/A 11/03/2021   Procedure: LEAD  REVISION/REPAIR;  Surgeon: Marinus Maw, MD;  Location: [facility];  Service: Cardiovascular;  Laterality: N/A;   MASTECTOMY Right    05/2019   PACEMAKER GENERATOR CHANGE N/A 08/23/2014   Procedure: PACEMAKER GENERATOR CHANGE;  Surgeon: Marinus Maw, MD;  Location: [facility] CATH LAB;  Service: Cardiovascular;  Laterality: N/A;   PACEMAKER PLACEMENT  2009   [facility]   RECTAL SURGERY  1976   REDUCTION MAMMAPLASTY Left    05/2019   TOTAL SHOULDER ARTHROPLASTY Right 03/10/2018   TOTAL SHOULDER ARTHROPLASTY Right 03/10/2018   Procedure: RIGHT TOTAL SHOULDER ARTHROPLASTY;  Surgeon: Jones Broom, MD;  Location: [facility] OR;  Service: Orthopedics;  Laterality: Right;   TUBAL LIGATION     Patient Active Problem List   Diagnosis Date Noted   Fracture of base of fifth metatarsal bone of right foot 11/02/2023   Presence of heart assist device (HCC) 10/07/2023   Primary osteoarthritis of left shoulder 09/08/2023   Injection site reaction 05/17/2023   Nonischemic cardiomyopathy (HCC) 02/16/2023   Class 3 obesity 10/30/2022   Acute on chronic systolic CHF (congestive heart failure) (HCC) 10/29/2022   Stress and adjustment reaction 04/08/2022   Gastroesophageal reflux disease 02/16/2022   AV block,  3rd degree (HCC) 10/24/2021   COPD (chronic obstructive pulmonary disease) (HCC) 09/21/2021   Osteoporosis 02/28/2021   Primary osteoarthritis of both knees 01/14/2021   Irritable bowel syndrome with constipation 09/16/2020   Aortic atherosclerosis (HCC) 06/11/2020   Benign lipomatous neoplasm of skin and subcutaneous tissue of left leg 06/11/2020   NSVT (nonsustained ventricular tachycardia) (HCC) 02/14/2020   Chronic constipation 10/06/2019   Genetic testing 08/14/2019   Ductal carcinoma in situ (DCIS) of right breast 06/15/2019   Arthralgia 04/11/2019   Malignant tumor of breast (HCC) 03/06/2019   Fatigue 03/06/2019   DDD (degenerative disc disease), cervical 11/19/2017    Peripheral edema 05/19/2017   Microcytic anemia 05/14/2017   Delayed sleep phase syndrome 10/29/2016   Primary osteoarthritis of left ankle 09/24/2016   Gastrointestinal food sensitivity 05/21/2016   History of arthroplasty of right shoulder 02/24/2016   Bilateral foot pain 01/22/2016   NASH (nonalcoholic steatohepatitis) 05/26/2015    Class: Stage 3   Nasal septal perforation 07/11/2014   Controlled diabetes mellitus type 2 with complications (HCC) 05/25/2014   Physical deconditioning 04/24/2014   Obesity, Class II, BMI 35-39.9, with comorbidity 06/09/2013   OSA (obstructive sleep apnea) 06/08/2013   Chronic systolic heart failure (HCC) 05/23/2013   Asthma, moderate persistent 04/18/2013   Chronic cough 04/18/2013   Breast cancer of upper-inner quadrant of left female breast (HCC) 03/18/2010   Type 2 diabetes mellitus with hyperlipidemia (HCC) 11/07/2009   Fibromyalgia 10/29/2009   Biventricular cardiac pacemaker in situ 10/23/2009   Abnormal levels of other serum enzymes 10/13/2009   Hypothyroidism 10/03/2009   Essential hypertension 07/02/2009   Chronic back pain 07/02/2009    PCP: Linford Arnold   REFERRING PROVIDER: Benjamin Stain  REFERRING DIAG: cervical DDD  THERAPY DIAG:  Cervicalgia  Other symptoms and signs involving the musculoskeletal system  Rationale for Evaluation and Treatment: Rehabilitation  ONSET DATE: 09/10/23  SUBJECTIVE:                                                                                                                                                                                                         SUBJECTIVE STATEMENT:  Patient reports she "got tangled up in the hallway at home and broke her 5th toe"; patient arrived to appointment in can boot. Patient states she missed her appointment earlier because her clocks were changed ot the wrong time. Patient states her neck is feeling better, states the swelling at the base of her neck is done  but still tender.   EVAL: Pt states she was sick for a while and  then returned to her "active seniors" group. She was having difficulty lifting her Lt shoulder when she returned and she saw MD. MD gave her an injection in her Lt shoulder which she states caused her to have neck and Rt shoulder pain. The pain was so bad she had to go to ED who gave her pain medicine. She reports the pain has decreased some but her main complaint is lack of strength in her shoulders and her neck. She states her neck continues with pain with walking "heavy", prolonged standing with looking down. Pt uses a cervical collar that she states she uses very sparingly but it does help Hand dominance: Right  PERTINENT HISTORY:  Rt Total shoulder replacement Pacemaker History of breast cancer with mastectomy Bilateral carpal tunnel  PAIN:  Are you having pain? Yes: NPRS scale: 0 currently, 6/10 at worst Pain location: neck to bilat UEs Pain description: sharp Aggravating factors: prolonged standing with looking down, "jolting" Relieving factors: cervical collar  PRECAUTIONS: ICD/Pacemaker  RED FLAGS: None     WEIGHT BEARING RESTRICTIONS: No  FALLS:  Has patient fallen in last 6 months? Yes. Number of falls multiple - pt states when she has tachycardia she can fall   OCCUPATION: retired  PLOF: Independent  PATIENT GOALS: decrease pain, use her LT UE better  NEXT MD VISIT: 11/18/2023  OBJECTIVE:  Note: Objective measures were completed at Evaluation unless otherwise noted.  DIAGNOSTIC FINDINGS:  X ray performed but not read on Eval  PATIENT SURVEYS:  NDI 21/50 42%  10/28/23 NDI 22 / 50 = 44.0 %  COGNITION: Overall cognitive status: Within functional limits for tasks assessed  SENSATION: Pt reports she occasionally has numbness in her fingers bilat  POSTURE: rounded shoulders and forward head  PALPATION: Pt TTP throughout cervical musculature, very TTP on cervical spinous processes - unable to  tolerate more than light touch with palpation   CERVICAL ROM:   Active ROM A/PROM (deg) eval AROM 10/25/23  Flexion 20 23  Extension 15 20  Right lateral flexion 18 30  Left lateral flexion 11 35  Right rotation 40 50  Left rotation 30 56   (Blank rows = not tested)  UPPER EXTREMITY ROM:  Lt shoulder limited to < 90 degrees flexion and abduction 10/28/23  flex 120 deg sitting; scaption in sitting 135 deg   UPPER EXTREMITY MMT:  MMT Right eval Left eval Right 10/28/23 Left 10/28/23  Shoulder flexion 3 3- 4 4+*  Shoulder extension      Shoulder abduction 3 3- 4+ 4+*  Shoulder adduction      Shoulder extension      Shoulder internal rotation      Shoulder external rotation      Middle trapezius      Lower trapezius      Elbow flexion      Elbow extension      Wrist flexion      Wrist extension      Wrist ulnar deviation      Wrist radial deviation      Wrist pronation      Wrist supination      Grip strength       (Blank rows = not tested)    TREATMENT DATE:  [facility] Adult PT Treatment:                                                DATE: 11/08/2023 Therapeutic Exercise: Seated:  Shoulder shrugs/depression + 1#DB x15 Bicep curls + 1#DB 2x15 Shoulder flexion & scaption to 90* + 1#DB 2x10 each Bkwd shoulder circles holding 1#DB 2x10 Shoulder extension + RTB 2x10 Rows + GTB 2x10 Neuromuscular re-ed: Seated with noodle for postural awareness: Bent arm shoulder ER + YTB x10  Straight arm shoulder horiz abd + YTB x10 Low to high diagonal pulls + YTB x10 (B)    [facility] Adult PT Treatment:                                                DATE: 10/28/2023 Therapeutic Activity: NDI, ROM, goals assessed Therapeutic Exercise: Standing:  Shoulder shrugs/depression + 1#DB Shoulder flexion & scaption to 90* + 1#DB x10 each Standing:  Shoulder extension + RTB  x10 Rows + GTB 2x10 Neuromuscular re-ed: Side Lying: (muscular activation & scapulohumeral rhythm cueing) Shoulder ER + 1#DB x 20 Shoulder abd + tactile cues for scap MWM Shoulder flexion to 90* Alt shoulder flexion <--> extension                                                                                                                            [facility] Adult PT Treatment:                                                DATE: 10/25/2023 Therapeutic Exercise: Standing:  Green PB roll out shoulder stretches: flexion & scaption Shoulder shrugs/depression + 1#DB Shoulder flexion & scaption to 90* + 1#DB x10 each Seated neck stretches: UT/LS Seated scap squeezes Standing:  Shoulder extension + RTB x10 Rows + GTB 2x10 Neuromuscular re-ed: Side Lying: (muscular activation & scapulohumeral rhythm cueing) Shoulder ER + 1#DB Shoulder abd + tactile cues for scap MWM Shoulder flexion to 90* Alt shoulder flexion <--> extension Bent arm elbow circles --> scap MWM   PATIENT EDUCATION:  Education details: rationale for interventions, HEP  Person educated: Patient Education method: Explanation, Demonstration, Tactile cues, Verbal cues Education comprehension: verbalized understanding, returned demonstration, verbal cues required, tactile cues required, and needs further education     HOME EXERCISE PROGRAM: Access Code: [email or web address] URL: [email or web address] Date: 10/18/2023 Prepared by: Carlynn Herald  Exercises - Seated Cervical Retraction  - 1 x daily - 7 x weekly - 2 sets - 10 reps - Seated Scapular Retraction  - 1 x daily - 7 x weekly -  3 sets - 10 reps - Gentle Levator Scapulae Stretch  - 1 x daily - 7 x weekly - 1 sets - 10 reps - 10 seconds hold - Seated Upper Trapezius Stretch  - 1 x daily - 7 x weekly - 1 sets - 10 reps - 10 seconds hold - Seated Shoulder Shrug  - 1 x daily - 7 x weekly - 3 sets - 10 reps - Seated Shoulder Shrug Circles AROM Backward  - 1 x daily - 7  x weekly - 3 sets - 10 reps - Seated Bilateral Shoulder Flexion Towel Slide at Table Top  - 1 x daily - 7 x weekly - 1 sets - 10 reps - 10 seconds hold - Seated Shoulder Row with Resistance Anchored at Feet  - 1 x daily - 7 x weekly - 3 sets - 10 reps  ASSESSMENT:  CLINICAL IMPRESSION:  UE and postural strengthening exercise continued in sitting due to low tolerance with standing from injured foot. Patient continues to demonstrate elevated shoulder compensation with arm raises (L>R). Cervical mobility continues to improve in all directions with minimal pain.   OBJECTIVE IMPAIRMENTS: decreased activity tolerance, decreased ROM, decreased strength, increased muscle spasms, and pain.    GOALS: Goals reviewed with patient? Yes  SHORT TERM GOALS: Target date: 10/26/2023  Pt will be independent in initial HEP Baseline:  Goal status: MET  2.  Pt will improve cervical rotation ROM by 10 degrees bilat Baseline: see above Goal status: MET    LONG TERM GOALS: Target date: 11/23/2023  Pt will be independent with advanced HEP Baseline:  Goal status: PARTIALLY MET  2.  Pt will improve NDI to <= 22% to demo improved functional mobility Baseline:  42% Goal status: IN PROGRESS  3.  Pt will report being able to wash dishes x 10 minutes with pain <= 2/10 Baseline:  Goal status: IN PROGRESS - hasn't tried  4.  Pt will be able to fully return to senior exercise class with pain < = 2/10 Baseline: 5/10 Goal status: IN PROGRESS    PLAN:  PT FREQUENCY: 2x/week  PT DURATION: 8 weeks  PLANNED INTERVENTIONS: 97164- PT Re-evaluation, 97110-Therapeutic exercises, 97530- Therapeutic activity, 97112- Neuromuscular re-education, 97535- Self Care, [id number]- Manual therapy, [phone]- Aquatic Therapy, Patient/Family education, Taping, Dry Needling, Cryotherapy, and Moist heat  PLAN FOR NEXT SESSION: Progress cervical mobility, postural and scapular strength    Carlynn Herald, PTA 11/08/2023 10:11 AM

## 2023-11-09 DIAGNOSIS — G4733 Obstructive sleep apnea (adult) (pediatric): Secondary | ICD-10-CM | POA: Diagnosis not present

## 2023-11-09 NOTE — Telephone Encounter (Signed)
 Patient called to advise take Amiodarone 200 mg daily. Patient voiced understanding. States she does not need more sent to pharmacy and [facility] is currently UTD. Advised to call if she has further questions or concerns.

## 2023-11-10 ENCOUNTER — Telehealth: Payer: Self-pay | Admitting: Internal Medicine

## 2023-11-10 ENCOUNTER — Ambulatory Visit: Attending: Cardiology | Admitting: Cardiology

## 2023-11-10 ENCOUNTER — Encounter: Payer: Self-pay | Admitting: Licensed Clinical Social Worker

## 2023-11-10 ENCOUNTER — Encounter: Payer: Self-pay | Admitting: Cardiology

## 2023-11-10 VITALS — BP 119/73 | HR 84 | Ht 61.0 in | Wt 200.0 lb

## 2023-11-10 DIAGNOSIS — I472 Ventricular tachycardia, unspecified: Secondary | ICD-10-CM | POA: Diagnosis not present

## 2023-11-10 DIAGNOSIS — I5022 Chronic systolic (congestive) heart failure: Secondary | ICD-10-CM | POA: Diagnosis not present

## 2023-11-10 DIAGNOSIS — I428 Other cardiomyopathies: Secondary | ICD-10-CM | POA: Diagnosis not present

## 2023-11-10 DIAGNOSIS — I700 Atherosclerosis of aorta: Secondary | ICD-10-CM | POA: Diagnosis not present

## 2023-11-10 DIAGNOSIS — Z950 Presence of cardiac pacemaker: Secondary | ICD-10-CM | POA: Diagnosis not present

## 2023-11-10 MED ORDER — AMIODARONE HCL 200 MG PO TABS: 200.0000 mg | ORAL_TABLET | Freq: Every day | ORAL | 3 refills | Status: DC

## 2023-11-10 NOTE — Telephone Encounter (Signed)
 Left a message for the pt to call back.

## 2023-11-10 NOTE — Patient Instructions (Signed)
    Follow-Up: At [facility], you and your health needs are our priority.  As part of our continuing mission to provide you with exceptional heart care, we have created designated Provider Care Teams.  These Care Teams include your primary Cardiologist (physician) and Advanced Practice Providers (APPs -  Physician Assistants and Nurse Practitioners) who all work together to provide you with the care you need, when you need it.     Your next appointment:   3 month(s)  Provider:   Olga Millers, MD

## 2023-11-10 NOTE — Telephone Encounter (Signed)
 Pt c/o medication issue:  1. Name of Medication:   amiodarone (PACERONE) 200 MG tablet    2. How are you currently taking this medication (dosage and times per day)? Take 1 tablet (200 mg total) by mouth daily.   3. Are you having a reaction (difficulty breathing--STAT)? No  4. What is your medication issue? Patient is calling because she was told to increase the dosage. Patient would like to speak with a nurse in regard to this medication. Please advise.

## 2023-11-10 NOTE — Progress Notes (Signed)
 HPI: Follow-up nonischemic cardiomyopathy, history of ventricular tachycardia, obstructive sleep apnea, hypertension, hyperlipidemia, diabetes mellitus and history of complete heart block status post biventricular pacemaker.  Previously followed by Dr. Shari Prows.  Also followed by Dr. Ladona Ridgel.  Patient has a history of nonischemic cardiomyopathy with device placement in 2004.  She had upgrade to CRT D in 2013.  Last cardiac catheterization in June 2021 showed normal coronary arteries with ejection fraction 35 to 40%.  Patient has not been able to tolerate vasodilators, carvedilol discontinued due to hypotension, digoxin discontinued due to rash and also felt fatigued and dehydrated with SGLT2 inhibitor.  Previously seen in advanced heart failure clinic and felt not to be a candidate for advanced therapies including LVAD or home inotropes.  She has been treated with amiodarone for ventricular tachycardia.  Most recent echocardiogram June 2024 at [facility] showed ejection fraction 20 to 25%, mild left ventricular enlargement, moderate left atrial enlargement, mild mitral regurgitation. Since last seen she denies dyspnea, chest pain, increased lower extremity edema.  Some dizziness occasionally with standing.  She recently fell fracturing her foot.  Not clear that this was syncope.  However she also had interrogation of her device which showed 3 VHR detections longest 1 hour 12 minutes.  Current Outpatient Medications  Medication Sig Dispense Refill   albuterol (PROVENTIL) (2.5 MG/3ML) 0.083% nebulizer solution Take 2.5 mg by nebulization every 6 (six) hours as needed for wheezing or shortness of breath.     albuterol (VENTOLIN HFA) 108 (90 Base) MCG/ACT inhaler Inhale 1-2 puffs into the lungs every 6 (six) hours as needed for wheezing or shortness of breath. 54 g 3   alendronate (FOSAMAX) 70 MG tablet TAKE 1 TABLET BY MOUTH EVERY 7  DAYS WITH FULL GLASS OF WATER ON AN EMPTY STOMACH 12 tablet 3   AMBULATORY  NON FORMULARY MEDICATION Medication Name: Tubing and supplies for nebulizer. Fax - [phone] 1 vial 0   amiodarone (PACERONE) 200 MG tablet Take 1 tablet (200 mg total) by mouth daily.     aspirin 81 MG chewable tablet Chew 81 mg by mouth daily.     Benzoyl Peroxide (PANOXYL FOAMING WASH) 10 % LIQD Wash groin creases daily with this wash  Ok to sub something similar 142 g 1   Calcium Carbonate-Vit D-Min (CALCIUM 600+D PLUS MINERALS) 600-400 MG-UNIT TABS 1 tab p.o. twice daily 180 tablet 3   carvedilol (COREG) 3.125 MG tablet Take 1 tablet (3.125 mg total) by mouth 2 (two) times daily. 180 tablet 3   Continuous Glucose Receiver (FREESTYLE LIBRE 3 READER) DEVI Use for continuous glucose monitoring 1 each 0   Continuous Glucose Sensor (FREESTYLE LIBRE 3 SENSOR) MISC by Does not apply route. Change every 14 days     EPINEPHrine 0.3 mg/0.3 mL IJ SOAJ injection Inject 0.3 mg into the muscle as needed for anaphylaxis. 2 each 1   fexofenadine (ALLEGRA) 60 MG tablet Take 1 tablet (60 mg total) by mouth 2 (two) times daily. 60 tablet 0   fluticasone-salmeterol (WIXELA INHUB) 250-50 MCG/ACT AEPB Inhale 1 puff into the lungs in the morning and at bedtime.     glucose blood test strip To be used to check blood glucose Dx:E11.8 FreeStyle Precision blood glucose for Freestyle Libre 2 100 each 12   hydrALAZINE (APRESOLINE) 10 MG tablet Take 1 tablet (10 mg total) by mouth in the morning and at bedtime. 180 tablet 3   isosorbide mononitrate (IMDUR) 30 MG 24 hr tablet Take 1 tablet (  30 mg total) by mouth daily. 90 tablet 3   levothyroxine (SYNTHROID) 112 MCG tablet Take 1 tablet (112 mcg total) by mouth daily. 90 tablet 0   lubiprostone (AMITIZA) 24 MCG capsule Take 24 mcg by mouth 2 (two) times daily with a meal. Patient reports provider has decreased to every three days.     meclizine (ANTIVERT) 25 MG tablet Take 1 tablet (25 mg total) by mouth 3 (three) times daily as needed for dizziness. 30 tablet 0    metFORMIN (GLUCOPHAGE-XR) 500 MG 24 hr tablet TAKE 1 TABLET BY MOUTH TWICE  DAILY WITH A MEAL 160 tablet 3   Multiple Vitamins-Minerals (MULTIPLE VITAMINS/WOMENS PO) Take 1 tablet by mouth daily.     Multiple Vitamins-Minerals (OCUVITE EYE + MULTI) TABS Take 1 tablet by mouth in the morning.     pantoprazole (PROTONIX) 40 MG tablet TAKE 1 TABLET BY MOUTH DAILY  BEFORE BREAKFAST (Patient taking differently: Take 40 mg by mouth 2 (two) times daily before a meal.) 90 tablet 3   pravastatin (PRAVACHOL) 40 MG tablet Take 1 tablet (40 mg total) by mouth at bedtime. 100 tablet 3   Tiotropium Bromide Monohydrate (SPIRIVA RESPIMAT) 1.25 MCG/ACT AERS Inhale 2 puffs into the lungs daily.     torsemide (DEMADEX) 20 MG tablet Take 1 tablet (20 mg total) by mouth daily. 90 tablet 3   valACYclovir (VALTREX) 1000 MG tablet Take 1 tablet (1,000 mg total) by mouth 2 (two) times daily. 14 tablet 2   No current facility-administered medications for this visit.     Past Medical History:  Diagnosis Date   Alkaline phosphatase elevation    Asthma    Breast cancer (HCC) 2011   Carpal tunnel syndrome    bilateral   Chronic HFrEF (heart failure with reduced ejection fraction) (HCC)    Cirrhosis, non-alcoholic (HCC)    Closed fracture of unspecified part of radius (alone)    DDD (degenerative disc disease), lumbar    Depression    pt denies   Diabetes mellitus without complication (HCC)    Enlarged heart    Fibromyalgia    GERD (gastroesophageal reflux disease)    History of kidney stones    HTN (hypertension)    Hypothyroidism    IBS (irritable bowel syndrome)    LBBB (left bundle branch block)    Microcytic anemia 05/14/2017   Mitral regurgitation    NICM (nonischemic cardiomyopathy) (HCC)    Pneumonia    Presence of permanent cardiac pacemaker    Pulmonary hypertension (HCC)    PVD (peripheral vascular disease) (HCC)    2019   Sleep apnea    uses cpap   Tricuspid regurgitation    Ventricular  tachyarrhythmia [facility])     Past Surgical History:  Procedure Laterality Date   BIV PACEMAKER GENERATOR CHANGEOUT N/A 11/03/2021   Procedure: BIV PACEMAKER GENERATOR CHANGEOUT;  Surgeon: Marinus Maw, MD;  Location: [facility] INVASIVE CV LAB;  Service: Cardiovascular;  Laterality: N/A;   BREAST LUMPECTOMY Left 2011   COLONOSCOPY     FOOT SURGERY     KNEE SURGERY     LEAD REVISION/REPAIR N/A 11/03/2021   Procedure: LEAD REVISION/REPAIR;  Surgeon: Marinus Maw, MD;  Location: [facility] INVAS[facility] CV [facility];  Service: Cardiovascular;  Laterality: N/A;   MASTECTOMY Right    05/2019   PACEMAKER GENERATOR CHANGE N/A 08/23/2014   Procedure: PACEMAKER GENERATOR CHANGE;  Surgeon: Marinus Maw, MD;  Location: [facility] CATH LAB;  Service: Cardiovascular;  Laterality: N/A;  PACEMAKER PLACEMENT  2009   [facility]   RECTAL SURGERY  1976   REDUCTION MAMMAPLASTY Left    05/2019   TOTAL SHOULDER ARTHROPLASTY Right 03/10/2018   TOTAL SHOULDER ARTHROPLASTY Right 03/10/2018   Procedure: RIGHT TOTAL SHOULDER ARTHROPLASTY;  Surgeon: Jones Broom, MD;  Location: [facility] OR;  Service: Orthopedics;  Laterality: Right;   TUBAL LIGATION      Social History   Socioeconomic History   Marital status: Divorced    Spouse name: Not on file   Number of children: 2   Years of education: 14   Highest education level: Associate degree: academic program  Occupational History   Occupation: quality insurcance in Sports coach    Comment: retired  Tobacco Use   Smoking status: Former    Current packs/day: 0.00    Types: Cigarettes    Quit date: 08/24/1966    Years since quitting: 57.2   Smokeless tobacco: Never  Vaping Use   Vaping status: Never Used  Substance and Sexual Activity   Alcohol use: No   Drug use: No   Sexual activity: Not Currently  Other Topics Concern   Not on file  Social History Narrative   Retired. Lives with and helps her brother. Likes Risk manager, Nutritional therapist and music. Loves animals.    Social Drivers of Health   Financial Resource Strain: Medium Risk (08/10/2023)   Overall Financial Resource Strain (CARDIA)    Difficulty of Paying Living Expenses: Somewhat hard  Food Insecurity: No Food Insecurity (10/25/2023)   Hunger Vital Sign    Worried About Running Out of Food in the Last Year: Never true    Ran Out of Food in the Last Year: Never true  Recent Concern: Food Insecurity - Food Insecurity Present (08/23/2023)   Hunger Vital Sign    Worried About Running Out of Food in the Last Year: Sometimes true    Ran Out of Food in the Last Year: Sometimes true  Transportation Needs: No Transportation Needs (10/25/2023)   PRAPARE - Administrator, Civil Service (Medical): No    Lack of Transportation (Non-Medical): No  Physical Activity: Sufficiently Active (08/10/2023)   Exercise Vital Sign    Days of Exercise per Week: 3 days    Minutes of Exercise per Session: 50 min  Recent Concern: Physical Activity - Insufficiently Active (07/12/2023)   Exercise Vital Sign    Days of Exercise per Week: 1 day    Minutes of Exercise per Session: 10 min  Stress: Stress Concern Present (08/10/2023)   [employer] of Occupational Health - Occupational Stress Questionnaire    Feeling of Stress : To some extent  Social Connections: Moderately Integrated (08/10/2023)   Social Connection and Isolation Panel [NHANES]    Frequency of Communication with Friends and Family: More than three times a week    Frequency of Social Gatherings with Friends and Family: More than three times a week    Attends Religious Services: More than 4 times per year    Active Member of [employer] or Organizations: Yes    Attends Banker Meetings: More than 4 times per year    Marital Status: Divorced  Intimate Partner Violence: Not At Risk (10/25/2023)   Humiliation, Afraid, Rape, and Kick questionnaire    Fear of Current or Ex-Partner: No    Emotionally Abused: No    Physically Abused: No     Sexually Abused: No    Family History  Problem Relation Age of  Onset   Heart disease Mother    Melanoma Mother    Parkinson's disease Mother    Heart disease Father    Heart disease Brother    Heart failure Other    Breast cancer Other    Hypertension Other     ROS: no fevers or chills, productive cough, hemoptysis, dysphasia, odynophagia, melena, hematochezia, dysuria, hematuria, rash, seizure activity, orthopnea, PND, pedal edema, claudication. Remaining systems are negative.  Physical Exam: Well-developed well-nourished in no acute distress.  Skin is warm and dry.  HEENT is normal.  Neck is supple.  Chest is clear to auscultation with normal expansion.  Cardiovascular exam is regular rate and rhythm.  Abdominal exam nontender or distended. No masses palpated. Extremities show no edema. neuro grossly intact  A/P  1 nonischemic cardiomyopathy/chronic systolic congestive heart failure-previous catheterization revealed normal coronary arteries.  Patient apparently had hyperkalemia with ACE inhibitors and ARB's in the past.  She did not tolerate higher doses of carvedilol. Continue hydralazine/nitrates.  She states she had fatigue/dehydration with SGLT2 inhibitor.  Continue Demadex. Note she was previously seen in advanced heart failure clinic and felt not to be a candidate for advanced therapies including LVAD and inotropes.  2 history of ventricular tachycardia-recent device interrogation as outlined above with 3 VHR detections.  She was to be taking 200 mg daily but apparently was taking 100 mg.  This was increased to back to 200 mg daily and will continue.  Check TSH and liver functions.  Recent chest x-ray with no active cardiopulmonary disease.  Will also check electrolytes including potassium and magnesium.  3 history of CRT-D-followed by electrophysiology.  Olga Millers, MD

## 2023-11-11 ENCOUNTER — Ambulatory Visit: Payer: 59

## 2023-11-11 DIAGNOSIS — R29898 Other symptoms and signs involving the musculoskeletal system: Secondary | ICD-10-CM

## 2023-11-11 DIAGNOSIS — M542 Cervicalgia: Secondary | ICD-10-CM | POA: Diagnosis not present

## 2023-11-11 LAB — COMPREHENSIVE METABOLIC PANEL
ALT: 38 IU/L — ABNORMAL HIGH (ref 0–32)
AST: 63 IU/L — ABNORMAL HIGH (ref 0–40)
Albumin: 4 g/dL (ref 3.8–4.8)
Alkaline Phosphatase: 175 IU/L — ABNORMAL HIGH (ref 44–121)
BUN/Creatinine Ratio: 19 (ref 12–28)
BUN: 13 mg/dL (ref 8–27)
Bilirubin Total: 0.7 mg/dL (ref 0.0–1.2)
CO2: 24 mmol/L (ref 20–29)
Calcium: 10.2 mg/dL (ref 8.7–10.3)
Chloride: 100 mmol/L (ref 96–106)
Creatinine, Ser: 0.68 mg/dL (ref 0.57–1.00)
Globulin, Total: 2.4 g/dL (ref 1.5–4.5)
Glucose: 119 mg/dL — ABNORMAL HIGH (ref 70–99)
Potassium: 3.9 mmol/L (ref 3.5–5.2)
Sodium: 140 mmol/L (ref 134–144)
Total Protein: 6.4 g/dL (ref 6.0–8.5)
eGFR: 91 mL/min/{1.73_m2} (ref >59)

## 2023-11-11 LAB — MAGNESIUM: Magnesium: 2 mg/dL (ref 1.6–2.3)

## 2023-11-11 LAB — TSH: TSH: 3.76 u[IU]/mL (ref 0.450–4.500)

## 2023-11-11 NOTE — Therapy (Signed)
 OUTPATIENT PHYSICAL THERAPY CERVICAL TREATMENT   Patient Name: Tanya Hogan MRN: [id number] DOB:Aug 21, 1951, 73 y.o., female Today's Date: 11/11/2023  END OF SESSION:  PT End of Session - 11/11/23 0849     Visit Number 12    Number of Visits 16    Date for PT Re-Evaluation 11/23/23    Authorization Type [employer] Medicare    PT Start Time 0848    PT Stop Time 0926    PT Time Calculation (min) 38 min    Activity Tolerance Patient tolerated treatment well    Behavior During Therapy [facility] for tasks assessed/performed            Past Medical History:  Diagnosis Date   Alkaline phosphatase elevation    Asthma    Breast cancer (HCC) 2011   Carpal tunnel syndrome    bilateral   Chronic HFrEF (heart failure with reduced ejection fraction) (HCC)    Cirrhosis, non-alcoholic (HCC)    Closed fracture of unspecified part of radius (alone)    DDD (degenerative disc disease), lumbar    Depression    pt denies   Diabetes mellitus without complication (HCC)    Enlarged heart    Fibromyalgia    GERD (gastroesophageal reflux disease)    History of kidney stones    HTN (hypertension)    Hypothyroidism    IBS (irritable bowel syndrome)    LBBB (left bundle branch block)    Microcytic anemia 05/14/2017   Mitral regurgitation    NICM (nonischemic cardiomyopathy) (HCC)    Pneumonia    Presence of permanent cardiac pacemaker    Pulmonary hypertension (HCC)    PVD (peripheral vascular disease) (HCC)    2019   Sleep apnea    uses cpap   Tricuspid regurgitation    Ventricular tachyarrhythmia [facility])    Past Surgical History:  Procedure Laterality Date   BIV PACEMAKER GENERATOR CHANGEOUT N/A 11/03/2021   Procedure: BIV PACEMAKER GENERATOR CHANGEOUT;  Surgeon: Marinus Maw, MD;  Location: [facility] INVASIVE CV LAB;  Service: Cardiovascular;  Laterality: N/A;   BREAST LUMPECTOMY Left 2011   COLONOSCOPY     FOOT SURGERY     KNEE SURGERY     LEAD REVISION/REPAIR N/A 11/03/2021   Procedure: LEAD  REVISION/REPAIR;  Surgeon: Marinus Maw, MD;  Location: [facility] INVASIVE CV LAB;  Service: Cardiovascular;  Laterality: N/A;   MASTECTOMY Right    05/2019   PACEMAKER GENERATOR CHANGE N/A 08/23/2014   Procedure: PACEMAKER GENERATOR CHANGE;  Surgeon: Marinus Maw, MD;  Location: [facility] CATH LAB;  Service: Cardiovascular;  Laterality: N/A;   PACEMAKER PLACEMENT  2009   [place] [facility]   RECTAL SURGERY  1976   REDUCTION MAMMAPLASTY Left    05/2019   TOTAL SHOULDER ARTHROPLASTY Right 03/10/2018   TOTAL SHOULDER ARTHROPLASTY Right 03/10/2018   Procedure: RIGHT TOTAL SHOULDER ARTHROPLASTY;  Surgeon: Jones Broom, MD;  Location: [facility] OR;  Service: Orthopedics;  Laterality: Right;   TUBAL LIGATION     Patient Active Problem List   Diagnosis Date Noted   Fracture of base of fifth metatarsal bone of right foot 11/02/2023   Presence of heart assist device (HCC) 10/07/2023   Primary osteoarthritis of left shoulder 09/08/2023   Injection site reaction 05/17/2023   Nonischemic cardiomyopathy (HCC) 02/16/2023   Class 3 obesity 10/30/2022   Acute on chronic systolic CHF (congestive heart failure) (HCC) 10/29/2022   Stress and adjustment reaction 04/08/2022   Gastroesophageal reflux disease 02/16/2022   AV block,  3rd degree (HCC) 10/24/2021   COPD (chronic obstructive pulmonary disease) (HCC) 09/21/2021   Osteoporosis 02/28/2021   Primary osteoarthritis of both knees 01/14/2021   Irritable bowel syndrome with constipation 09/16/2020   Aortic atherosclerosis (HCC) 06/11/2020   Benign lipomatous neoplasm of skin and subcutaneous tissue of left leg 06/11/2020   NSVT (nonsustained ventricular tachycardia) (HCC) 02/14/2020   Chronic constipation 10/06/2019   Genetic testing 08/14/2019   Ductal carcinoma in situ (DCIS) of right breast 06/15/2019   Arthralgia 04/11/2019   Malignant tumor of breast (HCC) 03/06/2019   Fatigue 03/06/2019   DDD (degenerative disc disease), cervical 11/19/2017    Peripheral edema 05/19/2017   Microcytic anemia 05/14/2017   Delayed sleep phase syndrome 10/29/2016   Primary osteoarthritis of left ankle 09/24/2016   Gastrointestinal food sensitivity 05/21/2016   History of arthroplasty of right shoulder 02/24/2016   Bilateral foot pain 01/22/2016   NASH (nonalcoholic steatohepatitis) 05/26/2015    Class: Stage 3   Nasal septal perforation 07/11/2014   Controlled diabetes mellitus type 2 with complications (HCC) 05/25/2014   Physical deconditioning 04/24/2014   Obesity, Class II, BMI 35-39.9, with comorbidity 06/09/2013   OSA (obstructive sleep apnea) 06/08/2013   Chronic systolic heart failure (HCC) 05/23/2013   Asthma, moderate persistent 04/18/2013   Chronic cough 04/18/2013   Breast cancer of upper-inner quadrant of left female breast (HCC) 03/18/2010   Type 2 diabetes mellitus with hyperlipidemia (HCC) 11/07/2009   Fibromyalgia 10/29/2009   Biventricular cardiac pacemaker in situ 10/23/2009   Abnormal levels of other serum enzymes 10/13/2009   Hypothyroidism 10/03/2009   Essential hypertension 07/02/2009   Chronic back pain 07/02/2009    PCP: Linford Arnold   REFERRING PROVIDER: Benjamin Stain  REFERRING DIAG: cervical DDD  THERAPY DIAG:  Cervicalgia  Other symptoms and signs involving the musculoskeletal system  Rationale for Evaluation and Treatment: Rehabilitation  ONSET DATE: 09/10/23  SUBJECTIVE:                                                                                                                                                                                                         SUBJECTIVE STATEMENT:  Patient reports she has been more dizzy than usual due to change in medications. Patient states she has been resting more and walking instead of going to exercise class.   EVAL: Pt states she was sick for a while and then returned to her "active seniors" group. She was having difficulty lifting her Lt shoulder when she  returned and she saw MD. MD gave her an injection in her Lt shoulder  which she states caused her to have neck and Rt shoulder pain. The pain was so bad she had to go to ED who gave her pain medicine. She reports the pain has decreased some but her main complaint is lack of strength in her shoulders and her neck. She states her neck continues with pain with walking "heavy", prolonged standing with looking down. Pt uses a cervical collar that she states she uses very sparingly but it does help Hand dominance: Right  PERTINENT HISTORY:  Rt Total shoulder replacement Pacemaker History of breast cancer with mastectomy Bilateral carpal tunnel  PAIN:  Are you having pain? Yes: NPRS scale: 0 currently, 6/10 at worst Pain location: neck to bilat UEs Pain description: sharp Aggravating factors: prolonged standing with looking down, "jolting" Relieving factors: cervical collar  PRECAUTIONS: ICD/Pacemaker  RED FLAGS: None     WEIGHT BEARING RESTRICTIONS: No  FALLS:  Has patient fallen in last 6 months? Yes. Number of falls multiple - pt states when she has tachycardia she can fall   OCCUPATION: retired  PLOF: Independent  PATIENT GOALS: decrease pain, use her LT UE better  NEXT MD VISIT: 11/18/2023  OBJECTIVE:  Note: Objective measures were completed at Evaluation unless otherwise noted.  DIAGNOSTIC FINDINGS:  X ray performed but not read on Eval  PATIENT SURVEYS:  NDI 21/50 42%  10/28/23 NDI 22 / 50 = 44.0 %  COGNITION: Overall cognitive status: Within functional limits for tasks assessed  SENSATION: Pt reports she occasionally has numbness in her fingers bilat  POSTURE: rounded shoulders and forward head  PALPATION: Pt TTP throughout cervical musculature, very TTP on cervical spinous processes - unable to tolerate more than light touch with palpation   CERVICAL ROM:   Active ROM A/PROM (deg) eval AROM 10/25/23  Flexion 20 23  Extension 15 20  Right lateral flexion 18  30  Left lateral flexion 11 35  Right rotation 40 50  Left rotation 30 56   (Blank rows = not tested)  UPPER EXTREMITY ROM:  Lt shoulder limited to < 90 degrees flexion and abduction 10/28/23  flex 120 deg sitting; scaption in sitting 135 deg   UPPER EXTREMITY MMT:  MMT Right eval Left eval Right 10/28/23 Left 10/28/23  Shoulder flexion 3 3- 4 4+*  Shoulder extension      Shoulder abduction 3 3- 4+ 4+*  Shoulder adduction      Shoulder extension      Shoulder internal rotation      Shoulder external rotation      Middle trapezius      Lower trapezius      Elbow flexion      Elbow extension      Wrist flexion      Wrist extension      Wrist ulnar deviation      Wrist radial deviation      Wrist pronation      Wrist supination      Grip strength       (Blank rows = not tested)    TREATMENT DATE:  [facility] Adult PT Treatment:                                                DATE: 11/11/2023 Therapeutic Exercise: Seated: Green PB roll out for shoulder flexion stretch Shoulder shrugs/depression + 1#DB x15 Bkwd shoulder circles holding 1#DB Bicep curls + 1#DB 2x10 Shoulder flexion & scaption to 90* + 1#DB x10 each Shoulder overhead press in abduction + 1#DB x10 Shoulder extension + RTB x10 Shoulder abduction + RTB x10 Rows + RTB 2x15 Neuromuscular re-ed: Seated with noodle for postural awareness: Scap squeezes Bent arm shoulder ER + YTB x12 Straight arm shoulder horiz abd + YTB x12 Low to high diagonal pulls + YTB x10 (B) Cervical rotation SNAGs    [facility] Adult PT Treatment:                                                DATE: 11/08/2023 Therapeutic Exercise: Seated:  Shoulder shrugs/depression + 1#DB x15 Bicep curls + 1#DB 2x15 Shoulder flexion & scaption to 90* + 1#DB 2x10 each Bkwd shoulder circles holding 1#DB 2x10 Shoulder extension + RTB  2x10 Rows + GTB 2x10 Neuromuscular re-ed: Seated with noodle for postural awareness: Bent arm shoulder ER + YTB x10  Straight arm shoulder horiz abd + YTB x10 Low to high diagonal pulls + YTB x10 (B)    [facility] Adult PT Treatment:                                                DATE: 10/28/2023 Therapeutic Activity: NDI, ROM, goals assessed Therapeutic Exercise: Standing:  Shoulder shrugs/depression + 1#DB Shoulder flexion & scaption to 90* + 1#DB x10 each Standing:  Shoulder extension + RTB x10 Rows + GTB 2x10 Neuromuscular re-ed: Side Lying: (muscular activation & scapulohumeral rhythm cueing) Shoulder ER + 1#DB x 20 Shoulder abd + tactile cues for scap MWM Shoulder flexion to 90* Alt shoulder flexion <--> extension                                                                                                                            PATIENT EDUCATION:  Education details: Updated HEP  Person educated: Patient Education method: Explanation, Demonstration, Tactile cues, Verbal cues Education comprehension: verbalized understanding, returned demonstration, verbal cues required, tactile cues required, and needs further education     HOME EXERCISE PROGRAM: Access Code: [email or web address] URL: [email or web address] Date: 10/18/2023 Prepared by: Carlynn Herald  Exercises - Seated Cervical Retraction  - 1 x daily - 7 x weekly - 2 sets - 10 reps - Seated  Scapular Retraction  - 1 x daily - 7 x weekly - 3 sets - 10 reps - Gentle Levator Scapulae Stretch  - 1 x daily - 7 x weekly - 1 sets - 10 reps - 10 seconds hold - Seated Upper Trapezius Stretch  - 1 x daily - 7 x weekly - 1 sets - 10 reps - 10 seconds hold - Seated Shoulder Shrug  - 1 x daily - 7 x weekly - 3 sets - 10 reps - Seated Shoulder Shrug Circles AROM Backward  - 1 x daily - 7 x weekly - 3 sets - 10 reps - Seated Bilateral Shoulder Flexion Towel Slide at Table Top  - 1 x daily - 7 x weekly - 1 sets - 10 reps - 10  seconds hold - Seated Shoulder Row with Resistance Anchored at Feet  - 1 x daily - 7 x weekly - 3 sets - 10 reps  ASSESSMENT:  CLINICAL IMPRESSION:  Seated shoulder strengthening exercises continues (seated modification due to aircast). Patient exhibited slight regression in endurance with exercises, taking rest breaks as needed. HEP updated with seated shoulder strengthening exercises; discussion with patient on compliance to progress endurance and strength.    OBJECTIVE IMPAIRMENTS: decreased activity tolerance, decreased ROM, decreased strength, increased muscle spasms, and pain.    GOALS: Goals reviewed with patient? Yes  SHORT TERM GOALS: Target date: 10/26/2023  Pt will be independent in initial HEP Baseline:  Goal status: MET  2.  Pt will improve cervical rotation ROM by 10 degrees bilat Baseline: see above Goal status: MET    LONG TERM GOALS: Target date: 11/23/2023  Pt will be independent with advanced HEP Baseline:  Goal status: PARTIALLY MET  2.  Pt will improve NDI to <= 22% to demo improved functional mobility Baseline:  42% Goal status: IN PROGRESS  3.  Pt will report being able to wash dishes x 10 minutes with pain <= 2/10 Baseline:  Goal status: IN PROGRESS - hasn't tried  4.  Pt will be able to fully return to senior exercise class with pain < = 2/10 Baseline: 5/10 Goal status: IN PROGRESS    PLAN:  PT FREQUENCY: 2x/week  PT DURATION: 8 weeks  PLANNED INTERVENTIONS: [id number]- PT Re-evaluation, 97110-Therapeutic exercises, 97530- Therapeutic activity, 97112- Neuromuscular re-education, 97535- Self Care, [id number]- Manual therapy, [id number]- Aquatic Therapy, Patient/Family education, Taping, Dry Needling, Cryotherapy, and Moist heat  PLAN FOR NEXT SESSION: Follow-up on new HEP, review as needed. Progress cervical mobility, postural and scapular strength    Carlynn Herald, PTA 11/11/2023 9:24 AM

## 2023-11-12 ENCOUNTER — Other Ambulatory Visit: Payer: Self-pay | Admitting: *Deleted

## 2023-11-12 ENCOUNTER — Encounter: Payer: Self-pay | Admitting: *Deleted

## 2023-11-12 DIAGNOSIS — I5022 Chronic systolic (congestive) heart failure: Secondary | ICD-10-CM

## 2023-11-12 DIAGNOSIS — R748 Abnormal levels of other serum enzymes: Secondary | ICD-10-CM

## 2023-11-15 ENCOUNTER — Ambulatory Visit: Payer: 59

## 2023-11-15 DIAGNOSIS — E118 Type 2 diabetes mellitus with unspecified complications: Secondary | ICD-10-CM | POA: Diagnosis not present

## 2023-11-15 DIAGNOSIS — M542 Cervicalgia: Secondary | ICD-10-CM

## 2023-11-15 DIAGNOSIS — R29898 Other symptoms and signs involving the musculoskeletal system: Secondary | ICD-10-CM

## 2023-11-15 NOTE — Therapy (Signed)
 OUTPATIENT PHYSICAL THERAPY CERVICAL TREATMENT   Patient Name: Tanya Hogan MRN: [id number] DOB:[birth year], 73 y.o., female Today's Date: 11/15/2023  END OF SESSION:  PT End of Session - 11/15/23 0848     Visit Number 13    Number of Visits 16    Date for PT Re-Evaluation 11/23/23    Authorization Type [employer] Medicare    PT Start Time 0850    PT Stop Time 0928    PT Time Calculation (min) 38 min    Activity Tolerance Patient tolerated treatment well    Behavior During Therapy [facility] for tasks assessed/performed            Past Medical History:  Diagnosis Date   Alkaline phosphatase elevation    Asthma    Breast cancer (HCC) 2011   Carpal tunnel syndrome    bilateral   Chronic HFrEF (heart failure with reduced ejection fraction) (HCC)    Cirrhosis, non-alcoholic (HCC)    Closed fracture of unspecified part of radius (alone)    DDD (degenerative disc disease), lumbar    Depression    pt denies   Diabetes mellitus without complication (HCC)    Enlarged heart    Fibromyalgia    GERD (gastroesophageal reflux disease)    History of kidney stones    HTN (hypertension)    Hypothyroidism    IBS (irritable bowel syndrome)    LBBB (left bundle branch block)    Microcytic anemia 05/14/2017   Mitral regurgitation    NICM (nonischemic cardiomyopathy) (HCC)    Pneumonia    Presence of permanent cardiac pacemaker    Pulmonary hypertension (HCC)    PVD (peripheral vascular disease) (HCC)    2019   Sleep apnea    uses cpap   Tricuspid regurgitation    Ventricular tachyarrhythmia [facility])    Past Surgical History:  Procedure Laterality Date   BIV PACEMAKER GENERATOR CHANGEOUT N/A 11/03/2021   Procedure: BIV PACEMAKER GENERATOR CHANGEOUT;  Surgeon: Marinus Maw, MD;  Location: [facility] INVASIVE CV LAB;  Service: Cardiovascular;  Laterality: N/A;   BREAST LUMPECTOMY Left 2011   COLONOSCOPY     FOOT SURGERY     KNEE SURGERY     LEAD REVISION/REPAIR N/A 11/03/2021   Procedure: LEAD  REVISION/REPAIR;  Surgeon: Marinus Maw, MD;  Location: [facility] INVASIVE CV LAB;  Service: Cardiovascular;  Laterality: N/A;   MASTECTOMY Right    05/2019   PACEMAKER GENERATOR CHANGE N/A 08/23/2014   Procedure: PACEMAKER GENERATOR CHANGE;  Surgeon: Marinus Maw, MD;  Location: [facility] CATH LAB;  Service: Cardiovascular;  Laterality: N/A;   PACEMAKER PLACEMENT  2009   [facility]   RECTAL SURGERY  1976   REDUCTION MAMMAPLASTY Left    05/2019   TOTAL SHOULDER ARTHROPLASTY Right 03/10/2018   TOTAL SHOULDER ARTHROPLASTY Right 03/10/2018   Procedure: RIGHT TOTAL SHOULDER ARTHROPLASTY;  Surgeon: Jones Broom, MD;  Location: [facility] OR;  Service: Orthopedics;  Laterality: Right;   TUBAL LIGATION     Patient Active Problem List   Diagnosis Date Noted   Fracture of base of fifth metatarsal bone of right foot 11/02/2023   Presence of heart assist device (HCC) 10/07/2023   Primary osteoarthritis of left shoulder 09/08/2023   Injection site reaction 05/17/2023   Nonischemic cardiomyopathy (HCC) 02/16/2023   Class 3 obesity 10/30/2022   Acute on chronic systolic CHF (congestive heart failure) (HCC) 10/29/2022   Stress and adjustment reaction 04/08/2022   Gastroesophageal reflux disease 02/16/2022   AV block,  3rd degree (HCC) 10/24/2021   COPD (chronic obstructive pulmonary disease) (HCC) 09/21/2021   Osteoporosis 02/28/2021   Primary osteoarthritis of both knees 01/14/2021   Irritable bowel syndrome with constipation 09/16/2020   Aortic atherosclerosis (HCC) 06/11/2020   Benign lipomatous neoplasm of skin and subcutaneous tissue of left leg 06/11/2020   NSVT (nonsustained ventricular tachycardia) (HCC) 02/14/2020   Chronic constipation 10/06/2019   Genetic testing 08/14/2019   Ductal carcinoma in situ (DCIS) of right breast 06/15/2019   Arthralgia 04/11/2019   Malignant tumor of breast (HCC) 03/06/2019   Fatigue 03/06/2019   DDD (degenerative disc disease), cervical 11/19/2017    Peripheral edema 05/19/2017   Microcytic anemia 05/14/2017   Delayed sleep phase syndrome 10/29/2016   Primary osteoarthritis of left ankle 09/24/2016   Gastrointestinal food sensitivity 05/21/2016   History of arthroplasty of right shoulder 02/24/2016   Bilateral foot pain 01/22/2016   NASH (nonalcoholic steatohepatitis) 05/26/2015    Class: Stage 3   Nasal septal perforation 07/11/2014   Controlled diabetes mellitus type 2 with complications (HCC) 05/25/2014   Physical deconditioning 04/24/2014   Obesity, Class II, BMI 35-39.9, with comorbidity 06/09/2013   OSA (obstructive sleep apnea) 06/08/2013   Chronic systolic heart failure (HCC) 05/23/2013   Asthma, moderate persistent 04/18/2013   Chronic cough 04/18/2013   Breast cancer of upper-inner quadrant of left female breast (HCC) 03/18/2010   Type 2 diabetes mellitus with hyperlipidemia (HCC) 11/07/2009   Fibromyalgia 10/29/2009   Biventricular cardiac pacemaker in situ 10/23/2009   Abnormal levels of other serum enzymes 10/13/2009   Hypothyroidism 10/03/2009   Essential hypertension 07/02/2009   Chronic back pain 07/02/2009    PCP: Linford Arnold   REFERRING PROVIDER: Benjamin Stain  REFERRING DIAG: cervical DDD  THERAPY DIAG:  Cervicalgia  Other symptoms and signs involving the musculoskeletal system  Rationale for Evaluation and Treatment: Rehabilitation  ONSET DATE: 09/10/23  SUBJECTIVE:                                                                                                                                                                                                         SUBJECTIVE STATEMENT:  Patient reports her medications have been changes and she feels "all out of whack and low energy".    EVAL: Pt states she was sick for a while and then returned to her "active seniors" group. She was having difficulty lifting her Lt shoulder when she returned and she saw MD. MD gave her an injection in her Lt shoulder  which she states caused her to have neck and Rt shoulder  pain. The pain was so bad she had to go to ED who gave her pain medicine. She reports the pain has decreased some but her main complaint is lack of strength in her shoulders and her neck. She states her neck continues with pain with walking "heavy", prolonged standing with looking down. Pt uses a cervical collar that she states she uses very sparingly but it does help Hand dominance: Right  PERTINENT HISTORY:  Rt Total shoulder replacement Pacemaker History of breast cancer with mastectomy Bilateral carpal tunnel  PAIN:  Are you having pain? Yes: NPRS scale: 0 currently, 6/10 at worst Pain location: neck to bilat UEs Pain description: sharp Aggravating factors: prolonged standing with looking down, "jolting" Relieving factors: cervical collar  PRECAUTIONS: ICD/Pacemaker  RED FLAGS: None     WEIGHT BEARING RESTRICTIONS: No  FALLS:  Has patient fallen in last 6 months? Yes. Number of falls multiple - pt states when she has tachycardia she can fall   OCCUPATION: retired  PLOF: Independent  PATIENT GOALS: decrease pain, use her LT UE better  NEXT MD VISIT: 11/18/2023  OBJECTIVE:  Note: Objective measures were completed at Evaluation unless otherwise noted.  DIAGNOSTIC FINDINGS:  X ray performed but not read on Eval  PATIENT SURVEYS:  NDI 21/50 42%  10/28/23 NDI 22 / 50 = 44.0 %  COGNITION: Overall cognitive status: Within functional limits for tasks assessed  SENSATION: Pt reports she occasionally has numbness in her fingers bilat  POSTURE: rounded shoulders and forward head  PALPATION: Pt TTP throughout cervical musculature, very TTP on cervical spinous processes - unable to tolerate more than light touch with palpation   CERVICAL ROM:   Active ROM A/PROM (deg) eval AROM 10/25/23  Flexion 20 23  Extension 15 20  Right lateral flexion 18 30  Left lateral flexion 11 35  Right rotation 40 50  Left  rotation 30 56   (Blank rows = not tested)  UPPER EXTREMITY ROM:  Lt shoulder limited to < 90 degrees flexion and abduction 10/28/23  flex 120 deg sitting; scaption in sitting 135 deg   UPPER EXTREMITY MMT:  MMT Right eval Left eval Right 10/28/23 Left 10/28/23  Shoulder flexion 3 3- 4 4+*  Shoulder extension      Shoulder abduction 3 3- 4+ 4+*  Shoulder adduction      Shoulder extension      Shoulder internal rotation      Shoulder external rotation      Middle trapezius      Lower trapezius      Elbow flexion      Elbow extension      Wrist flexion      Wrist extension      Wrist ulnar deviation      Wrist radial deviation      Wrist pronation      Wrist supination      Grip strength       (Blank rows = not tested)    TREATMENT DATE:  [facility] Adult PT Treatment:                                                DATE: 11/15/2023 Therapeutic Exercise: Seated UBE L0 x fwd/20min bkwd Standing rows + RTB 3x10 Seated neck stretches --> UT, LS Cervical AROM all directions + NDI Updated LTGs Self Care: Discussion of regression with physical activity tolerance due to change on medication/energy levels, aircast/difficulty with walking, and GI issues   [facility] Adult PT Treatment:                                                DATE: 11/11/2023 Therapeutic Exercise: Seated: Green PB roll out for shoulder flexion stretch Shoulder shrugs/depression + 1#DB x15 Bkwd shoulder circles holding 1#DB Bicep curls + 1#DB 2x10 Shoulder flexion & scaption to 90* + 1#DB x10 each Shoulder overhead press in abduction + 1#DB x10 Shoulder extension + RTB x10 Shoulder abduction + RTB x10 Rows + RTB 2x15 Neuromuscular re-ed: Seated with noodle for postural awareness: Scap squeezes Bent arm shoulder ER + YTB x12 Straight arm shoulder horiz abd + YTB x12 Low to high diagonal  pulls + YTB x10 (B) Cervical rotation SNAGs    [facility] Adult PT Treatment:                                                DATE: 11/08/2023 Therapeutic Exercise: Seated:  Shoulder shrugs/depression + 1#DB x15 Bicep curls + 1#DB 2x15 Shoulder flexion & scaption to 90* + 1#DB 2x10 each Bkwd shoulder circles holding 1#DB 2x10 Shoulder extension + RTB 2x10 Rows + GTB 2x10 Neuromuscular re-ed: Seated with noodle for postural awareness: Bent arm shoulder ER + YTB x10  Straight arm shoulder horiz abd + YTB x10 Low to high diagonal pulls + YTB x10 (B)                                                                                                                           PATIENT EDUCATION:  Education details: Updated HEP  Person educated: Patient Education method: Explanation, Demonstration, Tactile cues, Verbal cues Education comprehension: verbalized understanding, returned demonstration, verbal cues required, tactile cues required, and needs further education     HOME EXERCISE PROGRAM: Access Code: [email or web address] URL: [email or web address] Date: 10/18/2023 Prepared by: Carlynn Herald  Exercises - Seated Cervical Retraction  - 1 x daily - 7 x weekly - 2 sets - 10 reps - Seated Scapular Retraction  - 1 x daily - 7 x weekly - 3 sets - 10 reps - Gentle Levator Scapulae  Stretch  - 1 x daily - 7 x weekly - 1 sets - 10 reps - 10 seconds hold - Seated Upper Trapezius Stretch  - 1 x daily - 7 x weekly - 1 sets - 10 reps - 10 seconds hold - Seated Shoulder Shrug  - 1 x daily - 7 x weekly - 3 sets - 10 reps - Seated Shoulder Shrug Circles AROM Backward  - 1 x daily - 7 x weekly - 3 sets - 10 reps - Seated Bilateral Shoulder Flexion Towel Slide at Table Top  - 1 x daily - 7 x weekly - 1 sets - 10 reps - 10 seconds hold - Seated Shoulder Row with Resistance Anchored at Feet  - 1 x daily - 7 x weekly - 3 sets - 10 reps  ASSESSMENT:  CLINICAL IMPRESSION:  Patient presented with low energy  today and required seated rest break due to fatigue. Patient demonstrates improved cervical mobility in all directions with no exacerbation of pain, as well as improved shoulder flexion with minimal discomfort. Cueing continues to be provided for postural alignment and to decrease forward rounded shoulder posture in standing. Discussion with patient on potentially putting PT on hold due to other medical issues taking more precedence and patient making good progress with goals in POC.   OBJECTIVE IMPAIRMENTS: decreased activity tolerance, decreased ROM, decreased strength, increased muscle spasms, and pain.    GOALS: Goals reviewed with patient? Yes  SHORT TERM GOALS: Target date: 10/26/2023  Pt will be independent in initial HEP Baseline:  Goal status: MET  2.  Pt will improve cervical rotation ROM by 10 degrees bilat Baseline: see above Goal status: MET    LONG TERM GOALS: Target date: 11/23/2023  Pt will be independent with advanced HEP Baseline:  Goal status: MET  2.  Pt will improve NDI to <= 22% to demo improved functional mobility Baseline:  42%; 11/15/23: 21/50 = 42% Goal status: IN PROGRESS  3.  Pt will report being able to wash dishes x 10 minutes with pain <= 2/10 Baseline: ; 11/15/23: 0/10 Goal status: MET  4.  Pt will be able to fully return to senior exercise class with pain < = 2/10 Baseline: 5/10; 11/15/23: will return once out of aircast Goal status: IN PROGRESS    PLAN:  PT FREQUENCY: 2x/week  PT DURATION: 8 weeks  PLANNED INTERVENTIONS: 97164- PT Re-evaluation, 97110-Therapeutic exercises, 97530- Therapeutic activity, 97112- Neuromuscular re-education, 97535- Self Care, 16109- Manual therapy, [id number]- Aquatic Therapy, Patient/Family education, Taping, Dry Needling, Cryotherapy, and Moist heat  PLAN FOR NEXT SESSION: Progress cervical mobility, postural and scapular strength. Re-eval and then put on hold? Discharge?   Carlynn Herald, PTA 11/15/2023 9:34 AM

## 2023-11-15 NOTE — Telephone Encounter (Signed)
 Tanya Bunting, MD   LFTs remain elevated; DC pravastatin; CMET 12 weeks; pravastatin and amiodarone could be contributing; continue amiodarone for now. Olga Millers I will mail you the paperwork for the blood work in 12 weeks.    Patient identification verified by 2 forms. Marilynn Rail, RN    Called and spoke to patient  Patient states:   -reviewed medication concern at 3/210 OV   -would like to know recent results  Relayed provider result message  Advised patient:   -continue amiodarone   -d/c pravastatin   -present to lab in 12 weeks  Patient verbalized understanding  Patient had no further questions or concerns

## 2023-11-16 ENCOUNTER — Telehealth: Payer: Self-pay

## 2023-11-16 LAB — OPHTHALMOLOGY REPORT-SCANNED

## 2023-11-16 NOTE — Telephone Encounter (Signed)
 Copied from CRM [id number]. Topic: Clinical - Medical Advice >> Nov 16, 2023  4:32 PM Fuller Mandril wrote: Reason for CRM: Patient called. Was seen by Dr T on 3/11. Broke foot. Was told not to take boot off but would like to know when she can wash her foot. Also states she thinks she is supposed to follow up in 4 weeks. Is appt for 23rd ok or does she need on around 4/8? Thank You

## 2023-11-16 NOTE — Telephone Encounter (Signed)
 boot can come off daily to wash feet, whichever appointment she wants is fine.

## 2023-11-17 ENCOUNTER — Ambulatory Visit: Payer: Self-pay | Admitting: Licensed Clinical Social Worker

## 2023-11-17 DIAGNOSIS — G4733 Obstructive sleep apnea (adult) (pediatric): Secondary | ICD-10-CM | POA: Diagnosis not present

## 2023-11-17 DIAGNOSIS — J4540 Moderate persistent asthma, uncomplicated: Secondary | ICD-10-CM | POA: Diagnosis not present

## 2023-11-17 DIAGNOSIS — I509 Heart failure, unspecified: Secondary | ICD-10-CM | POA: Diagnosis not present

## 2023-11-17 DIAGNOSIS — R0602 Shortness of breath: Secondary | ICD-10-CM | POA: Diagnosis not present

## 2023-11-17 NOTE — Telephone Encounter (Signed)
 Patient has been updated with the provider's recommendation.

## 2023-11-17 NOTE — Patient Instructions (Signed)
 Visit Information  Thank you for taking time to visit with me today. Please don't hesitate to contact me if I can be of assistance to you.   Following are the goals we discussed today:   Goals Addressed             This Visit's Progress    COMPLETED: Car Coordination Activities       Care Coordination Interventions: Patient stated that she was recently in the hospital in the last week of November and has been home and that the hospital provided her a lighter [place] that is better for her to use.  Patient stated that her brother is in her home but he has parkinson diease and not to much help to her and that  she could use a HHA to help her take baths and to assist her in the home. The SW will inbox  the PCP. Patient still in need of a HHA, Patient is going to call [facility] to see if they will cover one. Sw will contact the PCP to let her know that is willing to take an order for limited time for PT and a HHA if insurance will not cover.  The patient stated that she is getting the U-card for food and the patient does not have any other SDOH issues or concerns SW will follow up 3/19/20252025 at 11:00 am        No further follow up, Sw encouraged the patient to contact the PCP if any SDOH needs arise.   Please call the care guide team at [phone] if you need to cancel or reschedule your appointment.   If you are experiencing a Mental Health or Behavioral Health Crisis or need someone to talk to, please call the Suicide and Crisis Lifeline: 988 go to [facility] [address] [place] [phone]) call 911  Patient verbalizes understanding of instructions and care plan provided today and agrees to view in MyChart. Active MyChart status and patient understanding of how to access instructions and care plan via MyChart confirmed with patient.     Jeanie Cooks, PhD [facility], [facility] Social  Worker Direct Dial: [phone]  Fax: [phone]

## 2023-11-17 NOTE — Patient Outreach (Signed)
 Care Coordination   Follow Up Visit Note   11/17/2023 Name: Tanya Hogan MRN: [id number] DOB: 1950/11/03  Tanya Hogan is a 73 y.o. year old female who sees [name], Barbarann Ehlers, MD for primary care. I spoke with  [name] by phone today.  What matters to the patients health and wellness today?  HHA follow up     Goals Addressed             This Visit's Progress    COMPLETED: Car Coordination Activities       Care Coordination Interventions: Patient stated that she was recently in the hospital in the last week of November and has been home and that the hospital provided her a lighter [place] that is better for her to use.  Patient stated that her brother is in her home but he has parkinson diease and not to much help to her and that  she could use a HHA to help her take baths and to assist her in the home. The SW will inbox  the PCP. Patient still in need of a HHA, Patient is going to call [facility] to see if they will cover one. Sw will contact the PCP to let her know that is willing to take an order for limited time for PT and a HHA if insurance will not cover.  The patient stated that she is getting the U-card for food and the patient does not have any other SDOH issues or concerns SW will follow up 3/19/20252025 at 11:00 am        SDOH assessments and interventions completed:  Yes  SDOH Interventions Today    Flowsheet Row Most Recent Value  SDOH Interventions   Food Insecurity Interventions Intervention Not Indicated  Housing Interventions Intervention Not Indicated  Transportation Interventions Intervention Not Indicated  Utilities Interventions Intervention Not Indicated        Care Coordination Interventions:  Yes, provided  Interventions Today    Flowsheet Row Most Recent Value  General Interventions   General Interventions Discussed/Reviewed General Interventions Reviewed, Science writer has DSS Case worker and they will be sending in a  HHA caretaker]        Follow up plan: No further intervention required.   Encounter Outcome:  Patient Visit Completed  Jeanie Cooks, PhD [facility], [facility] Social Worker Direct Dial: [phone]  Fax: [phone]

## 2023-11-18 ENCOUNTER — Ambulatory Visit: Payer: 59

## 2023-11-18 ENCOUNTER — Ambulatory Visit: Payer: Self-pay

## 2023-11-18 DIAGNOSIS — R29898 Other symptoms and signs involving the musculoskeletal system: Secondary | ICD-10-CM

## 2023-11-18 DIAGNOSIS — M542 Cervicalgia: Secondary | ICD-10-CM | POA: Diagnosis not present

## 2023-11-18 DIAGNOSIS — R0602 Shortness of breath: Secondary | ICD-10-CM | POA: Diagnosis not present

## 2023-11-18 DIAGNOSIS — I509 Heart failure, unspecified: Secondary | ICD-10-CM | POA: Diagnosis not present

## 2023-11-18 NOTE — Telephone Encounter (Signed)
 Spoke with patient who states she is on her way to get lab work done and will call back when she is available. No other information obtained.    Copied from CRM [id number]. Topic: Clinical - Medical Advice >> Nov 18, 2023  2:43 PM [name] wrote: Patient called to get advice on if she should come in to have labs done. Patient states that she has been experiencing a lot of fatigue the past few weeks. Reason for Disposition . RN needs further essential information from caller in order to complete triage  Protocols used: Information Only Call - No Triage-A-AH

## 2023-11-18 NOTE — Therapy (Addendum)
 OUTPATIENT PHYSICAL THERAPY CERVICAL TREATMENT AND DISCHARGE SUMMARY   Patient Name: Tanya Hogan MRN: [id number] DOB:1950/11/27, 73 y.o., female Today's Date: 11/18/2023  END OF SESSION:  PT End of Session - 11/18/23 0842     Visit Number 14    Number of Visits 16    Date for PT Re-Evaluation 11/23/23    Authorization Type [employer] Medicare    PT Start Time 0845    PT Stop Time 0925    PT Time Calculation (min) 40 min    Activity Tolerance Patient tolerated treatment well    Behavior During Therapy [facility] for tasks assessed/performed            Past Medical History:  Diagnosis Date   Alkaline phosphatase elevation    Asthma    Breast cancer (HCC) 2011   Carpal tunnel syndrome    bilateral   Chronic HFrEF (heart failure with reduced ejection fraction) (HCC)    Cirrhosis, non-alcoholic (HCC)    Closed fracture of unspecified part of radius (alone)    DDD (degenerative disc disease), lumbar    Depression    pt denies   Diabetes mellitus without complication (HCC)    Enlarged heart    Fibromyalgia    GERD (gastroesophageal reflux disease)    History of kidney stones    HTN (hypertension)    Hypothyroidism    IBS (irritable bowel syndrome)    LBBB (left bundle branch block)    Microcytic anemia 05/14/2017   Mitral regurgitation    NICM (nonischemic cardiomyopathy) (HCC)    Pneumonia    Presence of permanent cardiac pacemaker    Pulmonary hypertension (HCC)    PVD (peripheral vascular disease) (HCC)    2019   Sleep apnea    uses cpap   Tricuspid regurgitation    Ventricular tachyarrhythmia [facility])    Past Surgical History:  Procedure Laterality Date   BIV PACEMAKER GENERATOR CHANGEOUT N/A 11/03/2021   Procedure: BIV PACEMAKER GENERATOR CHANGEOUT;  Surgeon: Marinus Maw, MD;  Location: [facility] INVASIVE CV LAB;  Service: Cardiovascular;  Laterality: N/A;   BREAST LUMPECTOMY Left 2011   COLONOSCOPY     FOOT SURGERY     KNEE SURGERY     LEAD REVISION/REPAIR N/A  11/03/2021   Procedure: LEAD REVISION/REPAIR;  Surgeon: Marinus Maw, MD;  Location: [facility] INVASIVE CV LAB;  Service: Cardiovascular;  Laterality: N/A;   MASTECTOMY Right    05/2019   PACEMAKER GENERATOR CHANGE N/A 08/23/2014   Procedure: PACEMAKER GENERATOR CHANGE;  Surgeon: Marinus Maw, MD;  Location: [facility] CATH LAB;  Service: Cardiovascular;  Laterality: N/A;   PACEMAKER PLACEMENT  2009   [facility]   RECTAL SURGERY  1976   REDUCTION MAMMAPLASTY Left    05/2019   TOTAL SHOULDER ARTHROPLASTY Right 03/10/2018   TOTAL SHOULDER ARTHROPLASTY Right 03/10/2018   Procedure: RIGHT TOTAL SHOULDER ARTHROPLASTY;  Surgeon: Jones Broom, MD;  Location: [facility] OR;  Service: Orthopedics;  Laterality: Right;   TUBAL LIGATION     Patient Active Problem List   Diagnosis Date Noted   Fracture of base of fifth metatarsal bone of right foot 11/02/2023   Presence of heart assist device (HCC) 10/07/2023   Primary osteoarthritis of left shoulder 09/08/2023   Injection site reaction 05/17/2023   Nonischemic cardiomyopathy (HCC) 02/16/2023   Class 3 obesity 10/30/2022   Acute on chronic systolic CHF (congestive heart failure) (HCC) 10/29/2022   Stress and adjustment reaction 04/08/2022   Gastroesophageal reflux disease 02/16/2022  AV block, 3rd degree (HCC) 10/24/2021   COPD (chronic obstructive pulmonary disease) (HCC) 09/21/2021   Osteoporosis 02/28/2021   Primary osteoarthritis of both knees 01/14/2021   Irritable bowel syndrome with constipation 09/16/2020   Aortic atherosclerosis (HCC) 06/11/2020   Benign lipomatous neoplasm of skin and subcutaneous tissue of left leg 06/11/2020   NSVT (nonsustained ventricular tachycardia) (HCC) 02/14/2020   Chronic constipation 10/06/2019   Genetic testing 08/14/2019   Ductal carcinoma in situ (DCIS) of right breast 06/15/2019   Arthralgia 04/11/2019   Malignant tumor of breast (HCC) 03/06/2019   Fatigue 03/06/2019   DDD (degenerative disc disease),  cervical 11/19/2017   Peripheral edema 05/19/2017   Microcytic anemia 05/14/2017   Delayed sleep phase syndrome 10/29/2016   Primary osteoarthritis of left ankle 09/24/2016   Gastrointestinal food sensitivity 05/21/2016   History of arthroplasty of right shoulder 02/24/2016   Bilateral foot pain 01/22/2016   NASH (nonalcoholic steatohepatitis) 05/26/2015    Class: Stage 3   Nasal septal perforation 07/11/2014   Controlled diabetes mellitus type 2 with complications (HCC) 05/25/2014   Physical deconditioning 04/24/2014   Obesity, Class II, BMI 35-39.9, with comorbidity 06/09/2013   OSA (obstructive sleep apnea) 06/08/2013   Chronic systolic heart failure (HCC) 05/23/2013   Asthma, moderate persistent 04/18/2013   Chronic cough 04/18/2013   Breast cancer of upper-inner quadrant of left female breast (HCC) 03/18/2010   Type 2 diabetes mellitus with hyperlipidemia (HCC) 11/07/2009   Fibromyalgia 10/29/2009   Biventricular cardiac pacemaker in situ 10/23/2009   Abnormal levels of other serum enzymes 10/13/2009   Hypothyroidism 10/03/2009   Essential hypertension 07/02/2009   Chronic back pain 07/02/2009    PCP: Linford Arnold   REFERRING PROVIDER: Benjamin Stain  REFERRING DIAG: cervical DDD  THERAPY DIAG:  Cervicalgia  Other symptoms and signs involving the musculoskeletal system  Rationale for Evaluation and Treatment: Rehabilitation  ONSET DATE: 09/10/23  SUBJECTIVE:                                                                                                                                                                                                         SUBJECTIVE STATEMENT:  Patient reports "my sugars are below 100"; states she continues to have GI issues and "only want to drink lemonade". Patient states she has not reviewed HEP, that she is too busy with doctor's appointments.  EVAL: Pt states she was sick for a while and then returned to her "active seniors" group.  She was having difficulty lifting her Lt shoulder when she returned and she saw MD. MD gave  her an injection in her Lt shoulder which she states caused her to have neck and Rt shoulder pain. The pain was so bad she had to go to ED who gave her pain medicine. She reports the pain has decreased some but her main complaint is lack of strength in her shoulders and her neck. She states her neck continues with pain with walking "heavy", prolonged standing with looking down. Pt uses a cervical collar that she states she uses very sparingly but it does help Hand dominance: Right  PERTINENT HISTORY:  Rt Total shoulder replacement Pacemaker History of breast cancer with mastectomy Bilateral carpal tunnel  PAIN:  Are you having pain? Yes: NPRS scale: 0 currently, 6/10 at worst Pain location: neck to bilat UEs Pain description: sharp Aggravating factors: prolonged standing with looking down, "jolting" Relieving factors: cervical collar  PRECAUTIONS: ICD/Pacemaker  RED FLAGS: None     WEIGHT BEARING RESTRICTIONS: No  FALLS:  Has patient fallen in last 6 months? Yes. Number of falls multiple - pt states when she has tachycardia she can fall   OCCUPATION: retired  PLOF: Independent  PATIENT GOALS: decrease pain, use her LT UE better  NEXT MD VISIT: 11/18/2023  OBJECTIVE:  Note: Objective measures were completed at Evaluation unless otherwise noted.  DIAGNOSTIC FINDINGS:  X ray performed but not read on Eval  PATIENT SURVEYS:  NDI 21/50 42%  10/28/23 NDI 22 / 50 = 44.0 %  COGNITION: Overall cognitive status: Within functional limits for tasks assessed  SENSATION: Pt reports she occasionally has numbness in her fingers bilat  POSTURE: rounded shoulders and forward head  PALPATION: Pt TTP throughout cervical musculature, very TTP on cervical spinous processes - unable to tolerate more than light touch with palpation   CERVICAL ROM:   Active ROM A/PROM (deg) eval AROM 10/25/23   Flexion 20 23  Extension 15 20  Right lateral flexion 18 30  Left lateral flexion 11 35  Right rotation 40 50  Left rotation 30 56   (Blank rows = not tested)  UPPER EXTREMITY ROM:  Lt shoulder limited to < 90 degrees flexion and abduction 10/28/23  flex 120 deg sitting; scaption in sitting 135 deg   UPPER EXTREMITY MMT:  MMT Right eval Left eval Right 10/28/23 Left 10/28/23  Shoulder flexion 3 3- 4 4+*  Shoulder extension      Shoulder abduction 3 3- 4+ 4+*  Shoulder adduction      Shoulder extension      Shoulder internal rotation      Shoulder external rotation      Middle trapezius      Lower trapezius      Elbow flexion      Elbow extension      Wrist flexion      Wrist extension      Wrist ulnar deviation      Wrist radial deviation      Wrist pronation      Wrist supination      Grip strength       (Blank rows = not tested)    TREATMENT DATE:  [facility] Adult PT Treatment:                                                DATE: 11/18/2023 Therapeutic Exercise: Pulleys: flexion, abduction, horizontal ER  Standing:  Rows + RTB 3x10 Shoulder extension + YTB 3x10 Bicep curls 2#DB 2x10  Neuromuscular re-ed: Standing with noodle" Scap squeezes 10x3" Shoulder horizontal abd + RTB 2x10 Bent arm shoulder ER + YTB 2x10 Low-high diagonal pulls + YTB x12 (B) Self Care: Discussion on importance of HEP compliance, discharging vs putting PT on hold   [facility] Adult PT Treatment:                                                DATE: 11/15/2023 Therapeutic Exercise: Seated UBE L0 x fwd/69min bkwd Standing rows + RTB 3x10 Seated neck stretches --> UT, LS Cervical AROM all directions + NDI Updated LTGs Self Care: Discussion of regression with physical activity tolerance due to change on medication/energy levels, aircast/difficulty with walking, and GI  issues   [facility] Adult PT Treatment:                                                DATE: 11/11/2023 Therapeutic Exercise: Seated: Green PB roll out for shoulder flexion stretch Shoulder shrugs/depression + 1#DB x15 Bkwd shoulder circles holding 1#DB Bicep curls + 1#DB 2x10 Shoulder flexion & scaption to 90* + 1#DB x10 each Shoulder overhead press in abduction + 1#DB x10 Shoulder extension + RTB x10 Shoulder abduction + RTB x10 Rows + RTB 2x15 Neuromuscular re-ed: Seated with noodle for postural awareness: Scap squeezes Bent arm shoulder ER + YTB x12 Straight arm shoulder horiz abd + YTB x12 Low to high diagonal pulls + YTB x10 (B) Cervical rotation SNAGs                                                                                                                           PATIENT EDUCATION:  Education details: Updated HEP  Person educated: Patient Education method: Explanation, Demonstration, Tactile cues, Verbal cues Education comprehension: verbalized understanding, returned demonstration, verbal cues required, tactile cues required, and needs further education     HOME EXERCISE PROGRAM: Access Code: [email or web address] URL: [email or web address] Date: 11/18/2023 Prepared by: Carlynn Herald  Exercises - Seated Cervical Retraction  - 1 x daily - 7 x weekly - 2 sets - 10 reps - Seated Shoulder Row with Resistance Anchored at Feet  - 1 x daily - 7 x weekly - 3 sets - 10 reps - Seated Shoulder Abduction with Resistance  -  1 x daily - 7 x weekly - 3 sets - 10 reps - Seated Shoulder Extension with Resistance  - 1 x daily - 7 x weekly - 3 sets - 10 reps - Seated Bicep Curls with Rotation and Dumbbells  - 1 x daily - 7 x weekly - 3 sets - 10 reps - Standing Shoulder Flexion to 90 Degrees with Dumbbells  - 1 x daily - 7 x weekly - 3 sets - 10 reps - Scaption with Dumbbells  - 1 x daily - 7 x weekly - 3 sets - 10 reps - Shoulder Overhead Press in Abduction with Dumbbells  - 1 x  daily - 7 x weekly - 3 sets - 10 reps  ASSESSMENT:  CLINICAL IMPRESSION:  Postural strengthening exercises progressed in standing; continue to provide tactile cues to improve scapular retraction mechanics. Discussion with patient on importance of HEP compliance, as well as patient feeling overwhelmed with numerous doctor's appointments and PT and feeling she does not have enough time to spend on HEP; patient requests discharging due to schedule.  OBJECTIVE IMPAIRMENTS: decreased activity tolerance, decreased ROM, decreased strength, increased muscle spasms, and pain.    GOALS: Goals reviewed with patient? Yes  SHORT TERM GOALS: Target date: 10/26/2023  Pt will be independent in initial HEP Baseline:  Goal status: MET  2.  Pt will improve cervical rotation ROM by 10 degrees bilat Baseline: see above Goal status: MET   LONG TERM GOALS: Target date: 11/23/2023  Pt will be independent with advanced HEP Baseline:  Goal status: MET  2.  Pt will improve NDI to <= 22% to demo improved functional mobility Baseline:  42%; 11/15/23: 21/50 = 42% Goal status: NOT MET  3.  Pt will report being able to wash dishes x 10 minutes with pain <= 2/10 Baseline: --> 11/15/23: 0/10 Goal status: MET  4.  Pt will be able to fully return to senior exercise class with pain < = 2/10 Baseline: 5/10; 11/15/23: will return once out of aircast Goal status: NOT MET secondary to toe fracture   PLAN:  PT FREQUENCY: 2x/week  PT DURATION: 8 weeks  PLANNED INTERVENTIONS: 97164- PT Re-evaluation, 97110-Therapeutic exercises, 97530- Therapeutic activity, 97112- Neuromuscular re-education, 97535- Self Care, 16109- Manual therapy, [phone]- Aquatic Therapy, Patient/Family education, Taping, Dry Needling, Cryotherapy, and Moist heat  PLAN FOR NEXT SESSION: Discharge   Carlynn Herald, PTA 11/18/2023 9:26 AM     PHYSICAL THERAPY DISCHARGE SUMMARY  Visits from Start of Care: 14  Current functional level related  to goals / functional outcomes: See above   Remaining deficits: See above   Education / Equipment: HEP   Patient agrees to discharge. Patient goals were partially met. Patient is being discharged due to the patient's request. Patient feeling overwhelmed with multiple MD appointments and does not have time to do HEP consistently.    Solon Palm, PT 11/18/23 1:04 PM   [facility] [address] [place], [place] [address]  [phone] (office) [phone] (fax)

## 2023-11-19 NOTE — Telephone Encounter (Signed)
 Patient scheduled for November 23, 2023 with Tandy Gaw , [place]

## 2023-11-22 DIAGNOSIS — K581 Irritable bowel syndrome with constipation: Secondary | ICD-10-CM | POA: Diagnosis not present

## 2023-11-22 DIAGNOSIS — R748 Abnormal levels of other serum enzymes: Secondary | ICD-10-CM | POA: Diagnosis not present

## 2023-11-23 ENCOUNTER — Ambulatory Visit: Admitting: Physical Therapy

## 2023-11-23 ENCOUNTER — Ambulatory Visit (INDEPENDENT_AMBULATORY_CARE_PROVIDER_SITE_OTHER): Admitting: Physician Assistant

## 2023-11-23 ENCOUNTER — Ambulatory Visit

## 2023-11-23 VITALS — BP 101/63 | HR 69 | Ht 61.0 in | Wt 202.0 lb

## 2023-11-23 DIAGNOSIS — G4733 Obstructive sleep apnea (adult) (pediatric): Secondary | ICD-10-CM | POA: Diagnosis not present

## 2023-11-23 DIAGNOSIS — R3914 Feeling of incomplete bladder emptying: Secondary | ICD-10-CM | POA: Diagnosis not present

## 2023-11-23 DIAGNOSIS — R197 Diarrhea, unspecified: Secondary | ICD-10-CM | POA: Insufficient documentation

## 2023-11-23 DIAGNOSIS — R5383 Other fatigue: Secondary | ICD-10-CM

## 2023-11-23 DIAGNOSIS — Z79899 Other long term (current) drug therapy: Secondary | ICD-10-CM | POA: Diagnosis not present

## 2023-11-23 DIAGNOSIS — R4189 Other symptoms and signs involving cognitive functions and awareness: Secondary | ICD-10-CM | POA: Insufficient documentation

## 2023-11-23 DIAGNOSIS — R634 Abnormal weight loss: Secondary | ICD-10-CM | POA: Diagnosis not present

## 2023-11-23 DIAGNOSIS — R109 Unspecified abdominal pain: Secondary | ICD-10-CM | POA: Diagnosis not present

## 2023-11-23 DIAGNOSIS — K5909 Other constipation: Secondary | ICD-10-CM | POA: Diagnosis not present

## 2023-11-23 DIAGNOSIS — R198 Other specified symptoms and signs involving the digestive system and abdomen: Secondary | ICD-10-CM | POA: Diagnosis not present

## 2023-11-23 LAB — POCT URINALYSIS DIP (CLINITEK)
Bilirubin, UA: NEGATIVE
Blood, UA: NEGATIVE
Glucose, UA: NEGATIVE mg/dL
Ketones, POC UA: NEGATIVE mg/dL
Nitrite, UA: NEGATIVE
POC PROTEIN,UA: NEGATIVE
Spec Grav, UA: 1.015 (ref 1.010–1.025)
Urobilinogen, UA: 0.2 U/dL
pH, UA: 5.5 (ref 5.0–8.0)

## 2023-11-23 NOTE — Patient Instructions (Signed)
 Get labs Get xray of abdomen downstairs

## 2023-11-23 NOTE — Progress Notes (Unsigned)
 Acute Office Visit  Subjective:     Patient ID: Tanya Hogan, female    DOB: 11-Jan-1951, 73 y.o.   MRN: [id number]  No chief complaint on file.   HPI Patient is in today for follow up. Pt reports she just "needs someone to fix her".   She has a fractured left foot and in a cam boot. This is managed by Dr. Karie Schwalbe here in office.   She recently has activity on her CRT D device with 3 VHR detections. Her amiodarone was increased to 200mg  on 11/10/2023 by Dr. Jens Som. She reports she was also taken off Pravachol but I don't see documentation by provider about this.   She was seen by pulmonology on 11/17/23. He ordered lab work and ordered sleep study to be done with CPAP.   She saw GI on 11/22/2023. Recommended she stay on amitiza twice a daily and follow up in 4 months. Per patient she is only taking the [place] once a day.   Pt is concerned because she has had diarrhea for 6 months. She is told that she is constipated but she has at least 3 loose stools a day. She feels like she cannot stay hydrated and she does not have an appetite. She consisently feels like her bladder is full but goes to the bathroom very little.  She feels like she can eat very little and just feels full. She has lost 12lbs since Jan 2025. She has brain fog and problems concentrating. She doesn't know what to do to feel better.     ROS See HPI.      Objective:    BP 101/63   Pulse 69   Ht 5\' 1"  (1.549 m)   Wt 202 lb (91.6 kg)   BMI 38.17 kg/m  BP Readings from Last 3 Encounters:  11/24/23 101/63  11/10/23 119/73  10/30/23 127/74   Wt Readings from Last 3 Encounters:  11/24/23 202 lb (91.6 kg)  11/10/23 200 lb (90.7 kg)  10/07/23 207 lb (93.9 kg)   . Results for orders placed or performed in visit on 11/23/23  POCT URINALYSIS DIP (CLINITEK)   Collection Time: 11/23/23  1:34 PM  Result Value Ref Range   Color, UA yellow yellow   Clarity, UA clear clear   Glucose, UA negative negative mg/dL    Bilirubin, UA negative negative   Ketones, POC UA negative negative mg/dL   Spec Grav, UA 4.098 1.191 - 1.025   Blood, UA negative negative   pH, UA 5.5 5.0 - 8.0   POC PROTEIN,UA negative negative, trace   Urobilinogen, UA 0.2 0.2 or 1.0 E.U./dL   Nitrite, UA Negative Negative   Leukocytes, UA Trace (A) Negative  CMP14+EGFR   Collection Time: 11/23/23  1:58 PM  Result Value Ref Range   Glucose 107 (H) 70 - 99 mg/dL   BUN 10 8 - 27 mg/dL   Creatinine, Ser 4.78 0.57 - 1.00 mg/dL   eGFR 71 >29 FA/OZH/0.86   BUN/Creatinine Ratio 12 12 - 28   Sodium 140 134 - 144 mmol/L   Potassium 4.3 3.5 - 5.2 mmol/L   Chloride 102 96 - 106 mmol/L   CO2 24 20 - 29 mmol/L   Calcium 9.6 8.7 - 10.3 mg/dL   Total Protein 6.2 6.0 - 8.5 g/dL   Albumin 3.9 3.8 - 4.8 g/dL   Globulin, Total 2.3 1.5 - 4.5 g/dL   Bilirubin Total 0.4 0.0 - 1.2 mg/dL   Alkaline  Phosphatase 160 (H) 44 - 121 IU/L   AST 58 (H) 0 - 40 IU/L   ALT 27 0 - 32 IU/L   *Note: Due to a large number of results and/or encounters for the requested time period, some results have not been displayed. A complete set of results can be found in Results Review.      Physical Exam Constitutional:      Appearance: Normal appearance. She is obese.  HENT:     Head: Normocephalic.  Cardiovascular:     Rate and Rhythm: Normal rate and regular rhythm.  Pulmonary:     Effort: Pulmonary effort is normal.  Abdominal:     General: There is distension.     Palpations: Abdomen is soft.     Tenderness: There is no abdominal tenderness.  Musculoskeletal:     Comments: Left foot in cam boot.   Neurological:     Mental Status: She is alert and oriented to person, place, and time.  Psychiatric:        Mood and Affect: Mood normal.           Assessment & Plan:  Marland KitchenMarland KitchenDiagnoses and all orders for this visit:  Sensation as if bladder still full -     POCT URINALYSIS DIP (CLINITEK) -     Urine Culture  Medication management -      CMP14+EGFR  Overflow diarrhea -     DG Abd 1 View; Future -     CT ABDOMEN PELVIS W CONTRAST; Future  Brain fog  Chronic constipation -     CT ABDOMEN PELVIS W CONTRAST; Future  Unintended weight loss -     CT ABDOMEN PELVIS W CONTRAST; Future  Abdominal fullness -     CT ABDOMEN PELVIS W CONTRAST; Future  Fatigue, unspecified type   UA trace leukocytes but no other findings Will culture Not likely UTI  Likely diarrhea is overflow diarrhea and why GI wants patient to increase to bid Will get xray today of abdomen to access stool burden Will discuss with PcP on next steps  Ordered CT of abdomen  Unclear etiology of fatigue but likely more due to her chronic heart failure and arrhythmias Pt did just go up on amiodarone but do not think it is contributing to the fatigue or diarrhea  Reviewed labs that have been checked recently with other providers Thyroid looks good Normal hemoglobin Will check cmp to follow up on kidney and liver function I do want to make sure patient is eating enough to give her enough nutrition to have energy Hiatal hernia could be making her feel full faster Consider another GI referral?   Spent 47 minutes with patient in chart review and in the room with patient going over treatment plan and coordinating care.   Tandy Gaw, PA-C

## 2023-11-24 ENCOUNTER — Encounter: Payer: Self-pay | Admitting: Physician Assistant

## 2023-11-24 ENCOUNTER — Telehealth: Payer: Self-pay | Admitting: Cardiology

## 2023-11-24 LAB — CMP14+EGFR
ALT: 27 IU/L (ref 0–32)
AST: 58 IU/L — ABNORMAL HIGH (ref 0–40)
Albumin: 3.9 g/dL (ref 3.8–4.8)
Alkaline Phosphatase: 160 IU/L — ABNORMAL HIGH (ref 44–121)
BUN/Creatinine Ratio: 12 (ref 12–28)
BUN: 10 mg/dL (ref 8–27)
Bilirubin Total: 0.4 mg/dL (ref 0.0–1.2)
CO2: 24 mmol/L (ref 20–29)
Calcium: 9.6 mg/dL (ref 8.7–10.3)
Chloride: 102 mmol/L (ref 96–106)
Creatinine, Ser: 0.86 mg/dL (ref 0.57–1.00)
Globulin, Total: 2.3 g/dL (ref 1.5–4.5)
Glucose: 107 mg/dL — ABNORMAL HIGH (ref 70–99)
Potassium: 4.3 mmol/L (ref 3.5–5.2)
Sodium: 140 mmol/L (ref 134–144)
Total Protein: 6.2 g/dL (ref 6.0–8.5)
eGFR: 71 mL/min/{1.73_m2} (ref >59)

## 2023-11-24 NOTE — Telephone Encounter (Signed)
 Tanya Bunting, MD  Pugh, Juluis Pitch D, LPN1 hour ago (9:48 AM)    Agree with plan [facility]

## 2023-11-24 NOTE — Telephone Encounter (Signed)
 Spoke to patient she stated has swelling in both lower legs.Stated she fractured right foot and is wearing a boot.She has gained 3 lbs within the past couple of weeks.She has sob but it seems alittle worse.Stated she is concerned she has not had swelling in lower legs in a long time.Advised to double Torsemide dose for the next 3 days only then return to normal dose.Appointment scheduled with Bernadene Person NP 4/7 at 2:45 pm.Dr.Crenshaw out of office today.I will make him aware.

## 2023-11-24 NOTE — Telephone Encounter (Signed)
 Pt c/o swelling/edema: STAT if pt has developed SOB within 24 hours  If swelling, where is the swelling located? Legs   How much weight have you gained and in what time span? Not sure   Have you gained 2 pounds in a day or 5 pounds in a week? Not sure   Do you have a log of your daily weights (if so, list)? Did not log   Are you currently taking a fluid pill? Yes   Are you currently SOB? Not currently but has had SOB  Have you traveled recently in a car or plane for an extended period of time? No

## 2023-11-24 NOTE — Progress Notes (Signed)
 Normal gas bowel pattern. No significant stool burden.

## 2023-11-24 NOTE — Progress Notes (Signed)
 Kidney function above 60 which is good. Liver enzymes have improved some.

## 2023-11-25 LAB — URINE CULTURE

## 2023-11-26 ENCOUNTER — Ambulatory Visit: Payer: Self-pay | Admitting: *Deleted

## 2023-11-26 NOTE — Telephone Encounter (Signed)
  Chief Complaint: results- question about CT scan Symptoms: Patient notified of results per agent/nurse, additional question about CT scan ordered  Disposition: [] ED /[] Urgent Care (no appt availability in office) / [] Appointment(In office/virtual)/ []  [facility]/ [] Home Care/ [] Refused Recommended Disposition /[] [facility]/ [x]  Follow-up with PCP Additional Notes: Patient is concerned about her gallbladder and wants to make sure the scan will evaluate her gallbladder. Patient advised scan ordered is of abdomen-not sure if gall bladder would be accessed. Please follow up with patient.    Copied from CRM [id number]. Topic: Clinical - Lab/Test Results >> Nov 26, 2023 12:16 PM Irine Seal wrote: Reason for CRM: Patient called to get lab results. Read provider note verbatim. Patient verbalized understanding but has additional questions, sending to nurse for clarification. Reason for Disposition . Health Information question, no triage required and triager able to answer question  Answer Assessment - Initial Assessment Questions 1. REASON FOR CALL or QUESTION: "What is your reason for calling today?" or "How can I best help you?" or "What question do you have that I can help answer?"     Urine culture results- Patient notified: No bacteria growth on urine culture.  Patient also wants to make sure CT scan will check gall bladder- advised CT scan ordered is abdomen-not sure if includes gallbladder- may need to follow up with patient.  Protocols used: Information Only Call - No Triage-A-AH

## 2023-11-26 NOTE — Progress Notes (Signed)
 No bacteria growth on urine culture.

## 2023-11-26 NOTE — Telephone Encounter (Signed)
 Please review previous note and advise. Thanks in advance.

## 2023-11-28 ENCOUNTER — Other Ambulatory Visit: Payer: Self-pay | Admitting: Family Medicine

## 2023-11-28 DIAGNOSIS — E038 Other specified hypothyroidism: Secondary | ICD-10-CM

## 2023-11-29 ENCOUNTER — Ambulatory Visit

## 2023-11-29 ENCOUNTER — Encounter: Payer: Self-pay | Admitting: Nurse Practitioner

## 2023-11-29 ENCOUNTER — Ambulatory Visit: Attending: Nurse Practitioner | Admitting: Cardiology

## 2023-11-29 VITALS — BP 102/62 | HR 80 | Ht 61.0 in | Wt 196.4 lb

## 2023-11-29 DIAGNOSIS — R197 Diarrhea, unspecified: Secondary | ICD-10-CM | POA: Diagnosis not present

## 2023-11-29 DIAGNOSIS — I472 Ventricular tachycardia, unspecified: Secondary | ICD-10-CM

## 2023-11-29 DIAGNOSIS — R198 Other specified symptoms and signs involving the digestive system and abdomen: Secondary | ICD-10-CM | POA: Diagnosis not present

## 2023-11-29 DIAGNOSIS — G4733 Obstructive sleep apnea (adult) (pediatric): Secondary | ICD-10-CM | POA: Diagnosis not present

## 2023-11-29 DIAGNOSIS — R634 Abnormal weight loss: Secondary | ICD-10-CM

## 2023-11-29 DIAGNOSIS — I428 Other cardiomyopathies: Secondary | ICD-10-CM | POA: Diagnosis not present

## 2023-11-29 DIAGNOSIS — I5022 Chronic systolic (congestive) heart failure: Secondary | ICD-10-CM

## 2023-11-29 DIAGNOSIS — K5730 Diverticulosis of large intestine without perforation or abscess without bleeding: Secondary | ICD-10-CM | POA: Diagnosis not present

## 2023-11-29 DIAGNOSIS — K5909 Other constipation: Secondary | ICD-10-CM

## 2023-11-29 DIAGNOSIS — K8020 Calculus of gallbladder without cholecystitis without obstruction: Secondary | ICD-10-CM | POA: Diagnosis not present

## 2023-11-29 MED ORDER — IOHEXOL 300 MG/ML  SOLN
100.0000 mL | Freq: Once | INTRAMUSCULAR | Status: AC | PRN
  Administered 2023-11-29: 100 mL via INTRAVENOUS

## 2023-11-29 MED ORDER — IOHEXOL 9 MG/ML PO SOLN
500.0000 mL | ORAL | Status: AC
  Administered 2023-11-29 (×2): 500 mL via ORAL

## 2023-11-29 NOTE — Progress Notes (Signed)
 Cardiology Office Note:  .   Date:  11/29/2023  ID:  Tanya Hogan, DOB [birth year], MRN [id number] PCP: [name], MD  [facility] Providers Cardiologist:  Olga Millers, MD Electrophysiologist:  Lewayne Bunting, MD {  History of Present Illness: .   Tanya Hogan is a 73 y.o. female with history of nonischemic cardiomyopathy with reduced EF with BiV Medtronic PPM,  ventricular tachycardia, OSA on CPAP, hypertension, hyperlipidemia, diabetes, complete heart block with PPM.  Has history of nonischemic: Last cardiac catheterization in June 2021 showing normal coronary anatomy.  Last echocardiogram March 2024 demonstrated EF 20 to 25%.  BiV PPM generator change out 10/2021.  Intolerant to multiple medications and did not tolerate Coreg due to hypotension, digoxin due to rash and fatigue, SGLT2 inhibitor dehydration.  Previously seen by advanced heart failure clinic and not a candidate for advanced therapies.  She last saw Dr. Jens Som March 2025 who had noted increased lower extremity edema, mild dizziness.  Her March device interrogation showed 3 VHR detections with the longest being 1 hour and 12 minutes but was only on 100 mg of amiodarone when she should have been on 200, this was corrected last visit. She did call our office with complaints of increased peripheral edema after her office visit with Dr. Jens Som and we had doubled her torsemide dose for 3 days.  Today she returns for follow-up, noting that she is a little bit frustrated because she has not had issues with peripheral edema before however since her broken leg.  Has not had any significant shortness of breath, orthopnea. She responded very well to torsemide titration with return back to baseline.  Today she feels like she is still at baseline.  She has been noticing more fatigue recently.  Reports that she has had some low blood pressure readings.  Today she is 102/62.  Rarely is above 120/130.  Has not been taking her  blood pressure recently after breaking her leg.  She does report infrequent palpitations.  Once a week however not a significant issue for her.    ROS: Denies: Chest pain, shortness of breath, orthopnea, peripheral edema, palpitations, decreased exercise intolerance, fatigue, lightheadedness.   Studies Reviewed: .   Cardiac Studies & Procedures   ______________________________________________________________________________________________     ECHOCARDIOGRAM  ECHOCARDIOGRAM COMPLETE 10/30/2022  Narrative ECHOCARDIOGRAM REPORT    Patient Name:   Tanya Hogan Date of Exam: 10/30/2022 Medical Rec #:  [id number]       Height:       61.0 in Accession #:    [id number]      Weight:       216.7 lb Date of Birth:  09-Oct-1950        BSA:          1.954 m Patient Age:    71 years        BP:           126/64 mmHg Patient Gender: F               HR:           65 bpm. Exam Location:  Inpatient  Procedure: 2D Echo, Cardiac Doppler, Color Doppler and Intracardiac Opacification Agent  Indications:    CHF-Acute Systolic I50.21  History:        Patient has prior history of Echocardiogram examinations, most recent 01/08/2022. CHF, Pacemaker; Risk Factors:Hypertension, Diabetes, Dyslipidemia and Sleep Apnea. Breast Cancer of Left breast.  Sonographer:    Yvone Neu  [facility] Referring Phys: [id number] CAROLE N HALL  IMPRESSIONS   1. Left ventricular ejection fraction, by estimation, is 20 to 25%. The left ventricle has severely decreased function. The left ventricle demonstrates global hypokinesis. The left ventricular internal cavity size was moderately dilated. Left ventricular diastolic parameters are consistent with Grade II diastolic dysfunction (pseudonormalization). 2. Right ventricular systolic function is moderately reduced. The right ventricular size is normal. 3. Left atrial size was severely dilated. 4. Right atrial size was moderately dilated. 5. The mitral valve is normal in structure.  Mild mitral valve regurgitation. No evidence of mitral stenosis. 6. The aortic valve is tricuspid. There is mild calcification of the aortic valve. Aortic valve regurgitation is not visualized. Aortic valve sclerosis/calcification is present, without any evidence of aortic stenosis. Aortic valve area, by VTI measures 2.29 cm. Aortic valve mean gradient measures 5.0 mmHg. Aortic valve Vmax measures 1.55 m/s. 7. The inferior vena cava is normal in size with greater than 50% respiratory variability, suggesting right atrial pressure of 3 mmHg.  FINDINGS Left Ventricle: Left ventricular ejection fraction, by estimation, is 20 to 25%. The left ventricle has severely decreased function. The left ventricle demonstrates global hypokinesis. Definity contrast agent was given IV to delineate the left ventricular endocardial borders. The left ventricular internal cavity size was moderately dilated. There is no left ventricular hypertrophy. Left ventricular diastolic parameters are consistent with Grade II diastolic dysfunction (pseudonormalization).  Right Ventricle: The right ventricular size is normal. No increase in right ventricular wall thickness. Right ventricular systolic function is moderately reduced.  Left Atrium: Left atrial size was severely dilated.  Right Atrium: Right atrial size was moderately dilated.  Pericardium: Trivial pericardial effusion is present. The pericardial effusion is anterior to the right ventricle.  Mitral Valve: The mitral valve is normal in structure. Mild mitral valve regurgitation. No evidence of mitral valve stenosis.  Tricuspid Valve: The tricuspid valve is normal in structure. Tricuspid valve regurgitation is trivial. No evidence of tricuspid stenosis.  Aortic Valve: The aortic valve is tricuspid. There is mild calcification of the aortic valve. Aortic valve regurgitation is not visualized. Aortic valve sclerosis/calcification is present, without any evidence of  aortic stenosis. Aortic valve mean gradient measures 5.0 mmHg. Aortic valve peak gradient measures 9.6 mmHg. Aortic valve area, by VTI measures 2.29 cm.  Pulmonic Valve: The pulmonic valve was normal in structure. Pulmonic valve regurgitation is trivial. No evidence of pulmonic stenosis.  Aorta: The aortic root is normal in size and structure.  Venous: The inferior vena cava is normal in size with greater than 50% respiratory variability, suggesting right atrial pressure of 3 mmHg.  IAS/Shunts: No atrial level shunt detected by color flow Doppler.  Additional Comments: A device lead is visualized.   LEFT VENTRICLE PLAX 2D LVIDd:         6.50 cm   Diastology LVIDs:         5.50 cm   LV e' medial:    4.60 cm/s LV PW:         1.30 cm   LV E/e' medial:  27.6 LV IVS:        0.90 cm   LV e' lateral:   6.25 cm/s LVOT diam:     2.10 cm   LV E/e' lateral: 20.3 LV SV:         80 LV SV Index:   41 LVOT Area:     3.46 cm   RIGHT VENTRICLE  IVC RV S prime:     12.50 cm/s  IVC diam: 1.80 cm TAPSE (M-mode): 2.8 cm  LEFT ATRIUM              Index        RIGHT ATRIUM           Index LA diam:        5.50 cm  2.81 cm/m   RA Area:     21.80 cm LA Vol (A2C):   144.0 ml 73.68 ml/m  RA Volume:   69.50 ml  35.56 ml/m LA Vol (A4C):   97.5 ml  49.89 ml/m LA Biplane Vol: 122.0 ml 62.43 ml/m AORTIC VALVE AV Area (Vmax):    2.39 cm AV Area (Vmean):   2.24 cm AV Area (VTI):     2.29 cm AV Vmax:           155.00 cm/s AV Vmean:          104.000 cm/s AV VTI:            0.350 m AV Peak Grad:      9.6 mmHg AV Mean Grad:      5.0 mmHg LVOT Vmax:         107.00 cm/s LVOT Vmean:        67.367 cm/s LVOT VTI:          0.231 m LVOT/AV VTI ratio: 0.66  AORTA Ao Root diam: 3.30 cm Ao Asc diam:  3.10 cm  MITRAL VALVE                TRICUSPID VALVE MV Area (PHT): 4.49 cm     TR Peak grad:   36.2 mmHg MV Decel Time: 169 msec     TR Vmax:        301.00 cm/s MV E velocity: 127.00  cm/s MV A velocity: 90.00 cm/s   SHUNTS MV E/A ratio:  1.41         Systemic VTI:  0.23 m Systemic Diam: 2.10 cm  Arvilla Meres MD Electronically signed by Arvilla Meres MD Signature Date/Time: 10/30/2022/4:29:12 PM    Final          ______________________________________________________________________________________________       Risk Assessment/Calculations:             Physical Exam:   VS:  BP 102/62 (BP Location: Left Arm, Patient Position: Sitting, Cuff Size: Normal)   Pulse 80   Ht 5\' 1"  (1.549 m)   Wt 196 lb 6.4 oz (89.1 kg)   SpO2 96%   BMI 37.11 kg/m    Wt Readings from Last 3 Encounters:  11/29/23 196 lb 6.4 oz (89.1 kg)  11/24/23 202 lb (91.6 kg)  11/10/23 200 lb (90.7 kg)    GEN: Well nourished, well developed in no acute distress NECK: No JVD; No carotid bruits CARDIAC: RRR, no murmurs, rubs, gallops RESPIRATORY:  Clear to auscultation without rales, wheezing or rhonchi  ABDOMEN: Soft, non-tender, non-distended EXTREMITIES:  No edema; No deformity   ASSESSMENT AND PLAN: .    Nonischemic cardiomyopathy with reduced EF Medtronic CRT-P - 10/2022 EF 20 to 25%, G2DD moderately reduced RV Has had persistently low EF 20-25% despite device therapy. She's been struggling more with recent peripheral edema since she broke her leg. She responded well to short uptitration of torsemide. Now back to normal diuretic regiment. Euvolemic today. GDMT difficult to titrate due to an abundance of medication intolerances and hypotension. She's on low dose coreg  3.125 mg BID, stopping hydralazine since she's been hypotensive with issues of fatigue. Continue Imdur 30mg  daily and torsemide 20mg .  Had hyperkalemia with ACE/ARB.  Hypotension with carvedilol.  Fatigue/dehydration with SGLT2 inhibitor. Seen previously by advanced heart failure not a candidate for advanced therapies  History of VT Last device check in March showing 3 VHR detections with the longest being  112 minutes.  This was in the setting of taking amiodarone 100 mg when she should have been taking 200 mg. Having infrequent episodes of palpitations. Denies any dizziness or syncope.  Scheduled for 90-day device checks, she sees EP on 04/17 Continue amio 200mg  daily.   OSA on CPAP Compliant  Medication monitoring Amiodarone: stable labs/imaging Chest x-ray completed in December 2024.  Repeat annually  Eye exam.  TSH and CMP completed March 2025.  Repeat every 6 months       Dispo: Sees Dr Jens Som in June. Assess BP and fatigue. Titrate GDMT as tolerated. She's going to check her BP closely and record it for the next 2 weeks and let us know if this helps.  Signed, Abagail Kitchens, PA-C

## 2023-11-29 NOTE — Patient Instructions (Addendum)
 Medication Instructions:  Stop Hydralazine 10 mg as directed.  *If you need a refill on your cardiac medications before your next appointment, please call your pharmacy*  Lab Work: NONE ordered at this time of appointment   Testing/Procedures: NONE ordered at this time of appointment   Follow-Up: At [facility], you and your health needs are our priority.  As part of our continuing mission to provide you with exceptional heart care, our providers are all part of one team.  This team includes your primary Cardiologist (physician) and Advanced Practice Providers or APPs (Physician Assistants and Nurse Practitioners) who all work together to provide you with the care you need, when you need it.  Your next appointment:    Keep follow up   Provider:   Olga Millers, MD     We recommend signing up for the patient portal called "MyChart".  Sign up information is provided on this After Visit Summary.  MyChart is used to connect with patients for Virtual Visits (Telemedicine).  Patients are able to view lab/test results, encounter notes, upcoming appointments, etc.  Non-urgent messages can be sent to your provider as well.   To learn more about what you can do with MyChart, go to [email or web address].   Other Instructions Monitor blood pressure as directed.       1st Floor: - Lobby - Registration  - Pharmacy  - Lab - Cafe  2nd Floor: - PV Lab - Diagnostic Testing (echo, CT, nuclear med)  3rd Floor: - Vacant  4th Floor: - TCTS (cardiothoracic surgery) - AFib Clinic - Structural Heart Clinic - Vascular Surgery  - Vascular Ultrasound  5th Floor: - HeartCare Cardiology (general and EP) - Clinical Pharmacy for coumadin, hypertension, lipid, weight-loss medications, and med management appointments    Valet parking services will be available as well.

## 2023-11-30 ENCOUNTER — Encounter: Payer: Self-pay | Admitting: Sports Medicine

## 2023-11-30 ENCOUNTER — Ambulatory Visit (INDEPENDENT_AMBULATORY_CARE_PROVIDER_SITE_OTHER): Admitting: Sports Medicine

## 2023-11-30 DIAGNOSIS — S92351D Displaced fracture of fifth metatarsal bone, right foot, subsequent encounter for fracture with routine healing: Secondary | ICD-10-CM | POA: Diagnosis not present

## 2023-11-30 DIAGNOSIS — S92352D Displaced fracture of fifth metatarsal bone, left foot, subsequent encounter for fracture with routine healing: Secondary | ICD-10-CM

## 2023-11-30 NOTE — Progress Notes (Addendum)
    Procedures performed today:    None.  Independent interpretation of notes and tests performed by another provider:   None.  Brief History, Exam, Impression, and Recommendations:    Fracture of base of fifth metatarsal bone of left foot Very pleasant 73 year old female, she is now about 1 month past an inversion injury with fracture through the proximal shaft of the fifth metatarsal on the left. The fracture was somewhat near the [place] territory. She has been in a boot now for a month, she is taking calcium and vitamin D supplementation, she does have a history of osteoporosis. She still has some tenderness over the fracture though she is a bit better. Due to the persistent tenderness I think we need to continue mobilization but she is requesting somewhat less extensive so we will transition from a boot into a postop shoe. We will get updated x-rays today. Due to the location of this injury it is expected that this could take 8 to 12 weeks to heal. Return to see me in 4 to 6 weeks, x-ray before visit.    ____________________________________________ Ihor Austin. Benjamin Stain, M.D., ABFM., CAQSM., AME. Primary Care and Sports Medicine [facility]  Adjunct Professor of Family Medicine  [place] [employer]  Restaurant manager, fast food

## 2023-11-30 NOTE — Assessment & Plan Note (Signed)
 Very pleasant 73 year old female, she is now about 1 month past an inversion injury with fracture through the proximal shaft of the fifth metatarsal on the left. The fracture was somewhat near the [place] territory. She has been in a boot now for a month, she is taking calcium and vitamin D supplementation, she does have a history of osteoporosis. She still has some tenderness over the fracture though she is a bit better. Due to the persistent tenderness I think we need to continue mobilization but she is requesting somewhat less extensive so we will transition from a boot into a postop shoe. We will get updated x-rays today. Due to the location of this injury it is expected that this could take 8 to 12 weeks to heal. Return to see me in 4 to 6 weeks, x-ray before visit.

## 2023-12-01 ENCOUNTER — Encounter: Payer: Self-pay | Admitting: Physician Assistant

## 2023-12-01 NOTE — Progress Notes (Signed)
 CT of abdomen results:   No acute findings.  Small gallstone. No gallbladder thicken or fluid. Likely not causing any of your GI symptoms.  No acute bowel changes. Diverticulosis noted.  Aortic plaque

## 2023-12-02 ENCOUNTER — Ambulatory Visit: Payer: 59 | Admitting: Sports Medicine

## 2023-12-06 ENCOUNTER — Ambulatory Visit

## 2023-12-06 DIAGNOSIS — S92351D Displaced fracture of fifth metatarsal bone, right foot, subsequent encounter for fracture with routine healing: Secondary | ICD-10-CM

## 2023-12-06 DIAGNOSIS — M7732 Calcaneal spur, left foot: Secondary | ICD-10-CM | POA: Diagnosis not present

## 2023-12-06 DIAGNOSIS — S92352D Displaced fracture of fifth metatarsal bone, left foot, subsequent encounter for fracture with routine healing: Secondary | ICD-10-CM

## 2023-12-08 NOTE — Progress Notes (Unsigned)
  Electrophysiology Office Note:   ID:  Tanya, Hogan 05-Sep-1950, MRN [id number]  Primary Cardiologist: Alexandria Angel, MD Electrophysiologist: Manya Sells, MD  {Click to update primary MD,subspecialty MD or APP then REFRESH:1}    History of Present Illness:   Tanya Hogan is a 73 y.o. female with h/o HTN, HLD, DM, LBBB, NICM, OSA w/CPAP, CHB, chronic CHF (systolic), w/CRT-P, PVCs, breast Ca (s/p mastectomy)  seen today for {VISITTYPE:28148}  Review of systems complete and found to be negative unless listed in HPI.   EP Information / Studies Reviewed:    {EKGtoday:28818}       PPM Interrogation-  reviewed in detail today,  See PACEART report.  Arrhythmia/Device History MDT CRT-P implanted 11/14/2001 >> gen change and LB area lead placed 11/03/21 She has an abandoned RV (apical) lead    Physical Exam:   VS:  There were no vitals taken for this visit.   Wt Readings from Last 3 Encounters:  11/29/23 196 lb 6.4 oz (89.1 kg)  11/24/23 202 lb (91.6 kg)  11/10/23 200 lb (90.7 kg)     GEN: No acute distress  NECK: No JVD; No carotid bruits CARDIAC: {EPRHYTHM:28826}, no murmurs, rubs, gallops RESPIRATORY:  Clear to auscultation without rales, wheezing or rhonchi  ABDOMEN: Soft, non-tender, non-distended EXTREMITIES:  {EDEMA LEVEL:28147::"No"} edema; No deformity   ASSESSMENT AND PLAN:    Chronic systolic CHF s/p Medtronic CRT PPM  Complete Heart Block Normal PPM function See Pace Art report No changes today  Obesity There is no height or weight on file to calculate BMI.  Encouraged lifestyle modification   PVCs *** Continue amiodarone 200 mg daily  {Click here to Review PMH, Prob List, Meds, Allergies, SHx, FHx  :1}   Disposition:   Follow up with {EPPROVIDERS:28135} {EPFOLLOW UP:28173}  Signed, Tylene Galla, PA-C

## 2023-12-09 ENCOUNTER — Encounter: Payer: Self-pay | Admitting: Student

## 2023-12-09 ENCOUNTER — Ambulatory Visit: Payer: 59 | Attending: Pulmonary Disease | Admitting: Student

## 2023-12-09 VITALS — BP 110/70 | Ht 61.0 in | Wt 196.2 lb

## 2023-12-09 DIAGNOSIS — I472 Ventricular tachycardia, unspecified: Secondary | ICD-10-CM | POA: Diagnosis not present

## 2023-12-09 DIAGNOSIS — I428 Other cardiomyopathies: Secondary | ICD-10-CM

## 2023-12-09 DIAGNOSIS — I442 Atrioventricular block, complete: Secondary | ICD-10-CM | POA: Diagnosis not present

## 2023-12-09 DIAGNOSIS — I5022 Chronic systolic (congestive) heart failure: Secondary | ICD-10-CM | POA: Diagnosis not present

## 2023-12-09 LAB — CUP PACEART INCLINIC DEVICE CHECK
Battery Remaining Longevity: 60 mo
Battery Voltage: 2.99 V
Brady Statistic AP VP Percent: 50.5 %
Brady Statistic AP VS Percent: 0.15 %
Brady Statistic AS VP Percent: 45.4 %
Brady Statistic AS VS Percent: 3.96 %
Brady Statistic RA Percent Paced: 53.12 %
Brady Statistic RV Percent Paced: 95.9 %
Date Time Interrogation Session: 20250417110251
Implantable Lead Connection Status: 753985
Implantable Lead Connection Status: 753985
Implantable Lead Connection Status: 753985
Implantable Lead Implant Date: 20030324
Implantable Lead Implant Date: 20030324
Implantable Lead Implant Date: 20230313
Implantable Lead Location: 753858
Implantable Lead Location: 753859
Implantable Lead Location: 753860
Implantable Lead Model: 3830
Implantable Lead Model: 4068
Implantable Lead Model: 4193
Implantable Pulse Generator Implant Date: 20230313
Lead Channel Impedance Value: 3306 Ohm
Lead Channel Impedance Value: 3306 Ohm
Lead Channel Impedance Value: 3306 Ohm
Lead Channel Impedance Value: 342 Ohm
Lead Channel Impedance Value: 380 Ohm
Lead Channel Impedance Value: 437 Ohm
Lead Channel Impedance Value: 494 Ohm
Lead Channel Impedance Value: 798 Ohm
Lead Channel Impedance Value: 969 Ohm
Lead Channel Pacing Threshold Amplitude: 0.875 V
Lead Channel Pacing Threshold Amplitude: 2.25 V
Lead Channel Pacing Threshold Pulse Width: 0.4 ms
Lead Channel Pacing Threshold Pulse Width: 0.8 ms
Lead Channel Sensing Intrinsic Amplitude: 1.375 mV
Lead Channel Sensing Intrinsic Amplitude: 2.125 mV
Lead Channel Sensing Intrinsic Amplitude: 4.375 mV
Lead Channel Sensing Intrinsic Amplitude: 4.375 mV
Lead Channel Setting Pacing Amplitude: 2.5 V
Lead Channel Setting Pacing Amplitude: 2.5 V
Lead Channel Setting Pacing Amplitude: 3 V
Lead Channel Setting Pacing Pulse Width: 0.4 ms
Lead Channel Setting Pacing Pulse Width: 0.8 ms
Lead Channel Setting Sensing Sensitivity: 2.8 mV
Zone Setting Status: 755011
Zone Setting Status: 755011

## 2023-12-09 NOTE — Progress Notes (Signed)
 [facility] Quality Team Note  Name: Tanya Hogan Date of Birth: 31-Aug-1950 MRN: [id number] Date: 12/09/2023  [facility] Quality Team has reviewed this patient's chart, please see recommendations below:  [facility] Quality Other; (Patient needs Urine microalbumin/creatinine ratio completed as well as A1c 9.0 or less during upcoming office visit 12/20/2023)

## 2023-12-09 NOTE — Patient Instructions (Signed)
 Medication Instructions:  Your physician recommends that you continue on your current medications as directed. Please refer to the Current Medication list given to you today.  *If you need a refill on your cardiac medications before your next appointment, please call your pharmacy*  Lab Work: None ordered If you have labs (blood work) drawn today and your tests are completely normal, you will receive your results only by: MyChart Message (if you have MyChart) OR A paper copy in the mail If you have any lab test that is abnormal or we need to change your treatment, we will call you to review the results.  Follow-Up: At [facility], you and your health needs are our priority.  As part of our continuing mission to provide you with exceptional heart care, our providers are all part of one team.  This team includes your primary Cardiologist (physician) and Advanced Practice Providers or APPs (Physician Assistants and Nurse Practitioners) who all work together to provide you with the care you need, when you need it.  Your next appointment:   6 month(s)  Provider:   Casimiro Needle "Mardelle Matte" Lanna Poche, PA-C      1st Floor: - Lobby - Registration  - Pharmacy  - Lab - Cafe  2nd Floor: - PV Lab - Diagnostic Testing (echo, CT, nuclear med)  3rd Floor: - Vacant  4th Floor: - TCTS (cardiothoracic surgery) - AFib Clinic - Structural Heart Clinic - Vascular Surgery  - Vascular Ultrasound  5th Floor: - HeartCare Cardiology (general and EP) - Clinical Pharmacy for coumadin, hypertension, lipid, weight-loss medications, and med management appointments    Valet parking services will be available as well.

## 2023-12-10 DIAGNOSIS — G4733 Obstructive sleep apnea (adult) (pediatric): Secondary | ICD-10-CM | POA: Diagnosis not present

## 2023-12-11 ENCOUNTER — Ambulatory Visit: Payer: 59 | Admitting: Cardiology

## 2023-12-15 ENCOUNTER — Ambulatory Visit: Admitting: Sports Medicine

## 2023-12-20 ENCOUNTER — Ambulatory Visit (INDEPENDENT_AMBULATORY_CARE_PROVIDER_SITE_OTHER): Payer: 59 | Admitting: Family Medicine

## 2023-12-20 ENCOUNTER — Encounter: Payer: Self-pay | Admitting: Family Medicine

## 2023-12-20 VITALS — BP 126/62 | HR 72 | Ht 61.0 in | Wt 197.0 lb

## 2023-12-20 DIAGNOSIS — E785 Hyperlipidemia, unspecified: Secondary | ICD-10-CM

## 2023-12-20 DIAGNOSIS — I5022 Chronic systolic (congestive) heart failure: Secondary | ICD-10-CM | POA: Diagnosis not present

## 2023-12-20 DIAGNOSIS — E1169 Type 2 diabetes mellitus with other specified complication: Secondary | ICD-10-CM | POA: Diagnosis not present

## 2023-12-20 DIAGNOSIS — M7989 Other specified soft tissue disorders: Secondary | ICD-10-CM | POA: Diagnosis not present

## 2023-12-20 DIAGNOSIS — Z7984 Long term (current) use of oral hypoglycemic drugs: Secondary | ICD-10-CM

## 2023-12-20 DIAGNOSIS — I10 Essential (primary) hypertension: Secondary | ICD-10-CM

## 2023-12-20 LAB — POCT GLYCOSYLATED HEMOGLOBIN (HGB A1C): Hemoglobin A1C: 5.7 % — AB (ref 4.0–5.6)

## 2023-12-20 NOTE — Assessment & Plan Note (Signed)
Pressure looks fantastic.  Continue current regimen.

## 2023-12-20 NOTE — Assessment & Plan Note (Signed)
 A1C of 5.7 looks great today. She is doing really well

## 2023-12-20 NOTE — Progress Notes (Signed)
 Established Patient Office Visit  Subjective  Patient ID: Tanya Hogan, female    DOB: 04-18-1951  Age: 73 y.o. MRN: [id number]  Chief Complaint  Patient presents with   Diabetes   Hypertension   Edema    HPI  Diabetes - no hypoglycemic events. No wounds or sores that are not healing well. No increased thirst or urination. Checking glucose at home. Taking medications as prescribed without any side effects.  Hypertension- Pt denies chest pain, SOB, dizziness, or heart palpitations.  Taking meds as directed w/o problems.  Denies medication side effects.    She also has been struggling for a couple of weeks with lower extremity swelling.  In fact it was swollen when she went to see the cardiologist and they encouraged her to double up on her torsemide  for about 3 days she says it did work and it did help she has had to do it again since then.  She has been getting Meals on Wheels and so it has been a little bit more salt intake she limits her fluid to no more than 48 ounces a day.     ROS    Objective:     BP 126/62   Pulse 72   Ht 5\' 1"  (1.549 m)   Wt 197 lb (89.4 kg)   SpO2 97%   BMI 37.22 kg/m    Physical Exam Vitals and nursing note reviewed.  Constitutional:      Appearance: Normal appearance.  HENT:     Head: Normocephalic and atraumatic.  Eyes:     Conjunctiva/sclera: Conjunctivae normal.  Cardiovascular:     Rate and Rhythm: Normal rate and regular rhythm.  Pulmonary:     Effort: Pulmonary effort is normal.     Breath sounds: Normal breath sounds.  Skin:    General: Skin is warm and dry.  Neurological:     Mental Status: She is alert.  Psychiatric:        Mood and Affect: Mood normal.      Results for orders placed or performed in visit on 12/20/23  POCT HgB A1C  Result Value Ref Range   Hemoglobin A1C 5.7 (A) 4.0 - 5.6 %   HbA1c POC (<> result, manual entry)     HbA1c, POC (prediabetic range)     HbA1c, POC (controlled diabetic range)         The ASCVD Risk score (Arnett DK, et al., 2019) failed to calculate for the following reasons:   Risk score cannot be calculated because patient has a medical history suggesting prior/existing ASCVD    Assessment & Plan:   Problem List Items Addressed This Visit       Cardiovascular and Mediastinum   Essential hypertension   Pressure looks fantastic.  Continue current regimen.      Chronic systolic heart failure (HCC)   Recheck BNP.  Continue to work on low-salt diet. Encouraged compression stocking.          Endocrine   Type 2 diabetes mellitus with hyperlipidemia (HCC) - Primary   A1C of 5.7 looks great today. She is doing really well       Relevant Orders   POCT HgB A1C (Completed)   Basic Metabolic Panel (BMET)   B Nat Peptide   Urinalysis, Routine w reflex microscopic   Other Visit Diagnoses       Localized swelling of both lower extremities       Relevant Orders   Basic Metabolic Panel (BMET)  B Nat Peptide   Urinalysis, Routine w reflex microscopic      Swelling of both lower extremities-suspect might be from increased salt intake incidentally from the Meals on Wheels.  But it is providing some good nutrition and food for her.  Hopefully we do not have to permanently adjust her diuretic I do think starting to wear compression stockings would be helpful.  Will recheck a BMP.  Will call with results once available.  Handicap placard completed today. Start wearing your compression stockings again.  Return in about 3 months (around 03/20/2024) for bp/dm.    Duaine German, MD

## 2023-12-20 NOTE — Patient Instructions (Signed)
 Start wearing your compression stockings again.

## 2023-12-20 NOTE — Assessment & Plan Note (Signed)
 Recheck BNP.  Continue to work on low-salt diet. Encouraged compression stocking.

## 2023-12-21 ENCOUNTER — Encounter: Payer: Self-pay | Admitting: Family Medicine

## 2023-12-21 LAB — BASIC METABOLIC PANEL WITH GFR
BUN/Creatinine Ratio: 14 (ref 12–28)
BUN: 13 mg/dL (ref 8–27)
CO2: 25 mmol/L (ref 20–29)
Calcium: 10.2 mg/dL (ref 8.7–10.3)
Chloride: 102 mmol/L (ref 96–106)
Creatinine, Ser: 0.9 mg/dL (ref 0.57–1.00)
Glucose: 89 mg/dL (ref 70–99)
Potassium: 4.8 mmol/L (ref 3.5–5.2)
Sodium: 140 mmol/L (ref 134–144)
eGFR: 67 mL/min/{1.73_m2} (ref >59)

## 2023-12-21 LAB — URINALYSIS, ROUTINE W REFLEX MICROSCOPIC
Bilirubin, UA: NEGATIVE
Glucose, UA: NEGATIVE
Ketones, UA: NEGATIVE
Nitrite, UA: NEGATIVE
Protein,UA: NEGATIVE
RBC, UA: NEGATIVE
Specific Gravity, UA: 1.014 (ref 1.005–1.030)
Urobilinogen, Ur: 0.2 mg/dL (ref 0.2–1.0)
pH, UA: 5 (ref 5.0–7.5)

## 2023-12-21 LAB — MICROSCOPIC EXAMINATION
Bacteria, UA: NONE SEEN
Casts: NONE SEEN /LPF
RBC, Urine: NONE SEEN /HPF (ref 0–2)

## 2023-12-21 LAB — BRAIN NATRIURETIC PEPTIDE: BNP: 459.4 pg/mL — ABNORMAL HIGH (ref 0.0–100.0)

## 2023-12-21 NOTE — Addendum Note (Signed)
 Addended by: Lott Rouleau A on: 12/21/2023 11:18 AM   Modules accepted: Orders

## 2023-12-21 NOTE — Progress Notes (Signed)
 Remote pacemaker transmission.

## 2023-12-22 ENCOUNTER — Encounter: Payer: Self-pay | Admitting: Family Medicine

## 2023-12-22 NOTE — Progress Notes (Signed)
 Hi Tanya Hogan, your BNP is still a little elevated but it looks much much better so you have done a great job in getting a lot of that extra volume off.  About panel is normal.  No bacteria in the urine.

## 2023-12-26 DIAGNOSIS — G4733 Obstructive sleep apnea (adult) (pediatric): Secondary | ICD-10-CM | POA: Diagnosis not present

## 2023-12-26 DIAGNOSIS — E118 Type 2 diabetes mellitus with unspecified complications: Secondary | ICD-10-CM | POA: Diagnosis not present

## 2023-12-28 ENCOUNTER — Ambulatory Visit

## 2023-12-28 ENCOUNTER — Ambulatory Visit (INDEPENDENT_AMBULATORY_CARE_PROVIDER_SITE_OTHER): Admitting: Sports Medicine

## 2023-12-28 DIAGNOSIS — S92352D Displaced fracture of fifth metatarsal bone, left foot, subsequent encounter for fracture with routine healing: Secondary | ICD-10-CM | POA: Diagnosis not present

## 2023-12-28 DIAGNOSIS — M7732 Calcaneal spur, left foot: Secondary | ICD-10-CM | POA: Diagnosis not present

## 2023-12-28 DIAGNOSIS — S92351D Displaced fracture of fifth metatarsal bone, right foot, subsequent encounter for fracture with routine healing: Secondary | ICD-10-CM

## 2023-12-28 DIAGNOSIS — G4733 Obstructive sleep apnea (adult) (pediatric): Secondary | ICD-10-CM | POA: Diagnosis not present

## 2023-12-28 NOTE — Progress Notes (Signed)
    Procedures performed today:    None.  Independent interpretation of notes and tests performed by another provider:   X-rays personally reviewed, there is sclerosis and resorption around the fracture, there is also new bony callus, no displacement.  Brief History, Exam, Impression, and Recommendations:    Fracture of base of fifth metatarsal bone of left foot This is a very pleasant 73 year old female, she is now approximately 8 weeks status post inversion injury with fracture to the proximal shaft of the fifth metatarsal on the left, the fracture did appear to be in the [place] territory. She is doing a lot better today, I did personally review the x-rays which do show good bony callus and sclerosis with some bony resorption around the fracture margins. She is tender over the fracture so we will continue her postop shoe for another 4 to 6 weeks. She does understand that this fracture can take 12 weeks sometimes to heal.    ____________________________________________ Tanya Hogan. Sandy Crumb, M.D., ABFM., CAQSM., AME. Primary Care and Sports Medicine [facility]  Adjunct Professor of [facility]  [employer]  Restaurant manager, fast food

## 2023-12-28 NOTE — Assessment & Plan Note (Signed)
 This is a very pleasant 73 year old female, she is now approximately 8 weeks status post inversion injury with fracture to the proximal shaft of the fifth metatarsal on the left, the fracture did appear to be in the [place] territory. She is doing a lot better today, I did personally review the x-rays which do show good bony callus and sclerosis with some bony resorption around the fracture margins. She is tender over the fracture so we will continue her postop shoe for another 4 to 6 weeks. She does understand that this fracture can take 12 weeks sometimes to heal.

## 2024-01-06 DIAGNOSIS — L84 Corns and callosities: Secondary | ICD-10-CM | POA: Diagnosis not present

## 2024-01-06 DIAGNOSIS — M79671 Pain in right foot: Secondary | ICD-10-CM | POA: Diagnosis not present

## 2024-01-06 DIAGNOSIS — M79672 Pain in left foot: Secondary | ICD-10-CM | POA: Diagnosis not present

## 2024-01-06 DIAGNOSIS — L602 Onychogryphosis: Secondary | ICD-10-CM | POA: Diagnosis not present

## 2024-01-06 DIAGNOSIS — E119 Type 2 diabetes mellitus without complications: Secondary | ICD-10-CM | POA: Diagnosis not present

## 2024-01-09 DIAGNOSIS — G4733 Obstructive sleep apnea (adult) (pediatric): Secondary | ICD-10-CM | POA: Diagnosis not present

## 2024-01-10 ENCOUNTER — Telehealth: Payer: Self-pay | Admitting: Cardiology

## 2024-01-10 DIAGNOSIS — I5022 Chronic systolic (congestive) heart failure: Secondary | ICD-10-CM

## 2024-01-10 DIAGNOSIS — I428 Other cardiomyopathies: Secondary | ICD-10-CM | POA: Diagnosis not present

## 2024-01-10 NOTE — Telephone Encounter (Signed)
Spoke with pt, Aware of dr crenshaw's recommendations. Lab orders placed. 

## 2024-01-10 NOTE — Telephone Encounter (Signed)
 Spoke with pt over the phone and she had stated that she had actually had 5 lbs weight gain within 1 week originally. Has been doubling up her Toresemide to take 40 mg once daily in the AM to help get the fluid off. Pt states she contacted PCP as well last week and they had recommended compressions stockings which she is wearing now. Pt states she has lost 3-4 lbs of those 5 lbs gained and feels fine. She stated she didn't even realize she has been gaining weight. She states she doesn't feel the weight gain is cardiac related. Explained that I would forward this to Dr. Audery Blazing and his nurse to review for further recommendations/ guidance. Pt verbalized understanding of plan.

## 2024-01-10 NOTE — Addendum Note (Signed)
 Addended by: Render Carrie on: 01/10/2024 02:30 PM   Modules accepted: Orders

## 2024-01-10 NOTE — Telephone Encounter (Signed)
 Left message for pt to call.

## 2024-01-10 NOTE — Telephone Encounter (Signed)
 Pt c/o swelling/edema: STAT if pt has developed SOB within 24 hours  If swelling, where is the swelling located? legs  How much weight have you gained and in what time span? 2 lbs, ?  Have you gained 2 pounds in a day or 5 pounds in a week? Not sure  Do you have a log of your daily weights (if so, list)?   Are you currently taking a fluid pill? Yes,   torsemide  (DEMADEX ) 20 MG tablet taking 2 a day    Are you currently SOB? no  Have you traveled recently in a car or plane for an extended period of time? no

## 2024-01-11 ENCOUNTER — Ambulatory Visit: Payer: Self-pay | Admitting: Cardiology

## 2024-01-11 DIAGNOSIS — K5900 Constipation, unspecified: Secondary | ICD-10-CM | POA: Diagnosis not present

## 2024-01-11 DIAGNOSIS — K581 Irritable bowel syndrome with constipation: Secondary | ICD-10-CM | POA: Diagnosis not present

## 2024-01-11 DIAGNOSIS — M6289 Other specified disorders of muscle: Secondary | ICD-10-CM | POA: Diagnosis not present

## 2024-01-11 LAB — BASIC METABOLIC PANEL WITH GFR
BUN/Creatinine Ratio: 16 (ref 12–28)
BUN: 14 mg/dL (ref 8–27)
CO2: 21 mmol/L (ref 20–29)
Calcium: 10.4 mg/dL — ABNORMAL HIGH (ref 8.7–10.3)
Chloride: 100 mmol/L (ref 96–106)
Creatinine, Ser: 0.87 mg/dL (ref 0.57–1.00)
Glucose: 100 mg/dL — ABNORMAL HIGH (ref 70–99)
Potassium: 4.6 mmol/L (ref 3.5–5.2)
Sodium: 138 mmol/L (ref 134–144)
eGFR: 70 mL/min/{1.73_m2} (ref >59)

## 2024-01-18 ENCOUNTER — Encounter: Payer: Self-pay | Admitting: Cardiology

## 2024-01-18 ENCOUNTER — Ambulatory Visit (INDEPENDENT_AMBULATORY_CARE_PROVIDER_SITE_OTHER): Admitting: Family Medicine

## 2024-01-18 VITALS — BP 125/49 | HR 73 | Ht 61.0 in | Wt 197.0 lb

## 2024-01-18 DIAGNOSIS — R194 Change in bowel habit: Secondary | ICD-10-CM | POA: Diagnosis not present

## 2024-01-18 DIAGNOSIS — R635 Abnormal weight gain: Secondary | ICD-10-CM

## 2024-01-18 NOTE — Patient Instructions (Addendum)
 Try to get bowels moving in the next 2 days.   If your weight is not improving the next couple of days start taking your diuretic 2 on e day and then 1 the next day and alternate and see if that helps with the weight gain.

## 2024-01-18 NOTE — Progress Notes (Unsigned)
 Pt reports that 1 week ago on her home scale she weighed 190 lb she stated that since she got her new teeth she hasn't been eating or drinking as much.

## 2024-01-18 NOTE — Progress Notes (Unsigned)
   Acute Office Visit  Subjective:     Patient ID: Tanya Hogan, female    DOB: Jun 26, 1951, 73 y.o.   MRN: [id number]  Chief Complaint  Patient presents with   rapid weight loss    HPI Patient is in today for weight gain.  She says initially she was losing weight because of her dental issues but in the last week or more she has gained almost 10 lbs. She denies swelling in her feet or hands.  Bowels are not moving.well.  has been a littlecontipated.    She feels she isn't eating more.  Hx of heart failure.    She does feel bloated.  She says her last bowel movement was probably 4 days ago.  Did note that recently after seeing cardiology they had decreased her diuretic from 2 a day down to 1 a day.  ROS      Objective:    BP (!) 125/49   Pulse 73   Ht 5\' 1"  (1.549 m)   Wt 197 lb 0.6 oz (89.4 kg)   SpO2 99%   BMI 37.23 kg/m    Physical Exam Vitals and nursing note reviewed.  Constitutional:      Appearance: Normal appearance.  HENT:     Head: Normocephalic and atraumatic.  Eyes:     Conjunctiva/sclera: Conjunctivae normal.  Cardiovascular:     Rate and Rhythm: Normal rate and regular rhythm.  Pulmonary:     Effort: Pulmonary effort is normal.     Breath sounds: Normal breath sounds.  Skin:    General: Skin is warm and dry.  Neurological:     Mental Status: She is alert.  Psychiatric:        Mood and Affect: Mood normal.     No results found for any visits on 01/18/24.      Assessment & Plan:   Problem List Items Addressed This Visit   None Visit Diagnoses       Weight gain    -  Primary     Decreased frequency of bowel movements          Weight gain - per our records weight up about a lb.  I do wonder if constipation could be part of the weight gain and bloating.  But I do think there is probably also some volume overload.  Try to get bowels moving in the next 2 days.   Diuretic was reduced recently so it is possible that that could be  contributing.  If your weight is not improving the next couple of days start taking your diuretic 2 on e day and then 1 the next day and alternate and see if that helps with the weight gain.   No orders of the defined types were placed in this encounter.   Return in about 2 weeks (around 02/01/2024) for Nurse visit, weight check .  Duaine German, MD

## 2024-01-19 ENCOUNTER — Encounter: Payer: Self-pay | Admitting: Family Medicine

## 2024-01-20 NOTE — Telephone Encounter (Signed)
 Agree with plan  Tanya Hogan   I called the pt and advised her...she says she has not been watching her NA intake and will be more cautious about what she eats, She will monitor and let us  know how she is doing.

## 2024-01-24 ENCOUNTER — Ambulatory Visit: Payer: Self-pay

## 2024-01-24 ENCOUNTER — Telehealth (INDEPENDENT_AMBULATORY_CARE_PROVIDER_SITE_OTHER): Admitting: Medical-Surgical

## 2024-01-24 ENCOUNTER — Telehealth: Payer: Self-pay

## 2024-01-24 ENCOUNTER — Encounter: Payer: Self-pay | Admitting: Medical-Surgical

## 2024-01-24 DIAGNOSIS — H00034 Abscess of left upper eyelid: Secondary | ICD-10-CM

## 2024-01-24 DIAGNOSIS — H109 Unspecified conjunctivitis: Secondary | ICD-10-CM

## 2024-01-24 MED ORDER — ERYTHROMYCIN 5 MG/GM OP OINT: 1.0000 | TOPICAL_OINTMENT | Freq: Three times a day (TID) | OPHTHALMIC | 0 refills | Status: AC

## 2024-01-24 MED ORDER — DOXYCYCLINE HYCLATE 100 MG PO TABS: 100.0000 mg | ORAL_TABLET | Freq: Two times a day (BID) | ORAL | 0 refills | Status: DC

## 2024-01-24 NOTE — Telephone Encounter (Signed)
 Spoke with patient. Recommended that she call her insurance company and find out who their preferred vendors . Once she decides she can give our office a call and let us  know so we can forward the necessary information.

## 2024-01-24 NOTE — Progress Notes (Signed)
 Virtual Visit via Video Note  I connected with Tanya Hogan on 01/24/24 at  9:10 AM EDT by a video enabled telemedicine application and verified that I am speaking with the correct person using two identifiers.   I discussed the limitations of evaluation and management by telemedicine and the availability of in person appointments. The patient expressed understanding and agreed to proceed.  Patient location: home Provider locations: office  Subjective:    CC: Eye redness and swelling  HPI: Pleasant 73 year old female presenting via MyChart video visit with complaints of 4 days of left eye draining and swelling.  She had symptoms in both eyes however the right eye has improved.  Notes that her eye is draining thick yellow discharge and has been painful and itching.  She has been using colloidal silver eyedrops 3 times daily with some improvement noted.  Has also been using warm compresses to help facilitate drainage.  Endorses worsening blurriness of the left eye that is not resolved with using her glasses.  Past medical history, Surgical history, Family history not pertinant except as noted below, Social history, Allergies, and medications have been entered into the medical record, reviewed, and corrections made.   Review of Systems: See HPI for pertinent positives and negatives.   Objective:    General: Speaking clearly in complete sentences without any shortness of breath.  Alert and oriented x3.  Normal judgment. No apparent acute distress.  Left upper eyelid erythematous, swollen.  Impression and Recommendations:    1. Cellulitis of left upper eyelid (Primary) 2. Bacterial conjunctivitis of left eye Treating empirically with erythromycin ointment 3 times daily for 5 days.  Adding doxycycline  due to severity of swelling and erythema.  Continue warm compresses.  If symptoms do not improve and new onset blurriness does not return to baseline, return for in person evaluation. -  erythromycin ophthalmic ointment; Place 1 Application into both eyes 3 (three) times daily for 5 days. Apply 1 inch ribbon to affected eye TID for 5 days.  Dispense: 15 g; Refill: 0 - doxycycline  (VIBRA -TABS) 100 MG tablet; Take 1 tablet (100 mg total) by mouth 2 (two) times daily.  Dispense: 14 tablet; Refill: 0  I discussed the assessment and treatment plan with the patient. The patient was provided an opportunity to ask questions and all were answered. The patient agreed with the plan and demonstrated an understanding of the instructions.   The patient was advised to call back or seek an in-person evaluation if the symptoms worsen or if the condition fails to improve as anticipated.  Return if symptoms worsen or fail to improve.  Tanya Snook, DNP, APRN, FNP-BC [facility] and Sports Medicine

## 2024-01-24 NOTE — Telephone Encounter (Signed)
 Copied from CRM [id number]. Topic: Clinical - Medical Advice >> Jan 21, 2024  9:36 AM Tanya Hogan wrote: Reason for CRM: Patient states she has been searching for a company that could provide her some diabetic shoes. Patient states she has called a few local places with no success. Patient would like the provider to  give some recommendations on what company they would suggest. Patients call back [phone]

## 2024-01-24 NOTE — Telephone Encounter (Signed)
 The patient had a virtual visit with Cherre Cornish. This request has been handled. No further action is required.

## 2024-01-24 NOTE — Telephone Encounter (Unsigned)
 Copied from CRM [id number]. Topic: Clinical - Medical Advice >> Jan 21, 2024  9:36 AM Maryln Sober wrote: Reason for CRM: Patient states she has been searching for a company that could provide her some diabetic shoes. Patient states she has called a few local places with no success. Patient would like the provider to  give some recommendations on what company they would suggest. Patients call back [phone]

## 2024-01-24 NOTE — Telephone Encounter (Signed)
 Chief Complaint: Eye swelling Symptoms: drainage, pink sclera, itching, warmth Frequency: x 4 days Pertinent Negatives: Patient denies fever Disposition: [] ED /[] Urgent Care (no appt availability in office) / [x] Appointment(In office/virtual)/ []  [facility]/ [] Home Care/ [] Refused Recommended Disposition /[]  Mobile Bus/ []  Follow-up with PCP Additional Notes: Pt reports she began with bilateral eyelid swelling, redness, drainage x 4 days ago. Denies fever. VV scheduled per pt's request. This RN educated pt on home care, new-worsening symptoms, when to call back/seek emergent care. Pt verbalized understanding and agrees to plan.    Copied from CRM [id number]. Topic: Clinical - Red Word Triage >> Jan 24, 2024  8:31 AM Danelle Dunning F wrote: [facility] that prompted transfer to Nurse Triage: pink eye in both eyes; eye lids red; discharge from both eyes ( Pain for the past four days; previously her eyes just felt dry);   Patient has been treating the eyes with colloidal silver Reason for Disposition  Blurred vision  Answer Assessment - Initial Assessment Questions 1. EYE DISCHARGE: "Is the discharge in one or both eyes?" "What color is it?" "How much is there?" "When did the discharge start?"      Both, left is wore 2. REDNESS OF SCLERA: "Is the redness in one or both eyes?" "When did the redness start?"      Yes, bilateral 3. EYELIDS: "Are the eyelids red or swollen?" If Yes, ask: "How much?"      Yes 4. VISION: "Is there any difficulty seeing clearly?"      Was at first,left still has some "blurry" 5. PAIN: "Is there any pain? If Yes, ask: "How bad is it?" (Scale 1-10; or mild, moderate, severe)    - MILD (1-3): doesn't interfere with normal activities     - MODERATE (4-7): interferes with normal activities or awakens from sleep    - SEVERE (8-10): excruciating pain, unable to do any normal activities        Mild, intermittent 6. CONTACT LENS: "Do you wear contacts?"      None 7. OTHER SYMPTOMS: "Do you have any other symptoms?" (e.g., fever, runny nose, cough)     Runny nose  Protocols used: Eye - Pus or Discharge-A-AH

## 2024-01-28 DIAGNOSIS — G4733 Obstructive sleep apnea (adult) (pediatric): Secondary | ICD-10-CM | POA: Diagnosis not present

## 2024-02-01 ENCOUNTER — Ambulatory Visit (INDEPENDENT_AMBULATORY_CARE_PROVIDER_SITE_OTHER): Payer: Medicare Other

## 2024-02-01 DIAGNOSIS — I428 Other cardiomyopathies: Secondary | ICD-10-CM

## 2024-02-02 ENCOUNTER — Telehealth: Payer: Self-pay | Admitting: *Deleted

## 2024-02-02 LAB — CUP PACEART REMOTE DEVICE CHECK
Battery Remaining Longevity: 60 mo
Battery Voltage: 2.99 V
Brady Statistic AP VP Percent: 62.68 %
Brady Statistic AP VS Percent: 0.08 %
Brady Statistic AS VP Percent: 36.46 %
Brady Statistic AS VS Percent: 0.79 %
Brady Statistic RA Percent Paced: 63.19 %
Brady Statistic RV Percent Paced: 99.14 %
Date Time Interrogation Session: 20250609235943
Implantable Lead Connection Status: 753985
Implantable Lead Connection Status: 753985
Implantable Lead Connection Status: 753985
Implantable Lead Implant Date: 20030324
Implantable Lead Implant Date: 20030324
Implantable Lead Implant Date: 20230313
Implantable Lead Location: 753858
Implantable Lead Location: 753859
Implantable Lead Location: 753860
Implantable Lead Model: 3830
Implantable Lead Model: 4068
Implantable Lead Model: 4193
Implantable Pulse Generator Implant Date: 20230313
Lead Channel Impedance Value: 3306 Ohm
Lead Channel Impedance Value: 3306 Ohm
Lead Channel Impedance Value: 3306 Ohm
Lead Channel Impedance Value: 342 Ohm
Lead Channel Impedance Value: 380 Ohm
Lead Channel Impedance Value: 494 Ohm
Lead Channel Impedance Value: 532 Ohm
Lead Channel Impedance Value: 798 Ohm
Lead Channel Impedance Value: 950 Ohm
Lead Channel Pacing Threshold Amplitude: 0.875 V
Lead Channel Pacing Threshold Amplitude: 2.125 V
Lead Channel Pacing Threshold Pulse Width: 0.4 ms
Lead Channel Pacing Threshold Pulse Width: 0.8 ms
Lead Channel Sensing Intrinsic Amplitude: 1.125 mV
Lead Channel Sensing Intrinsic Amplitude: 1.125 mV
Lead Channel Sensing Intrinsic Amplitude: 4.375 mV
Lead Channel Sensing Intrinsic Amplitude: 4.375 mV
Lead Channel Setting Pacing Amplitude: 2.5 V
Lead Channel Setting Pacing Amplitude: 2.5 V
Lead Channel Setting Pacing Amplitude: 3 V
Lead Channel Setting Pacing Pulse Width: 0.4 ms
Lead Channel Setting Pacing Pulse Width: 0.8 ms
Lead Channel Setting Sensing Sensitivity: 2.8 mV
Zone Setting Status: 755011
Zone Setting Status: 755011

## 2024-02-02 NOTE — Telephone Encounter (Signed)
 Called and lvm advising pt that she will need to call [employer] to get her forms for the Diabetic shoes.

## 2024-02-02 NOTE — Telephone Encounter (Signed)
 Copied from CRM [id number]. Topic: Clinical - Order For Equipment >> Feb 01, 2024  4:19 PM Tisa Forester wrote: Reason for CRM: patient call ask for Diabetic [employer] recommended  company name is  [employer] the  Please call [employer] for diabetic shoes at [phone]) to get the information for [place] company  Patient need provider to sumbit paper for patient  Patient call back [phone]

## 2024-02-03 ENCOUNTER — Ambulatory Visit: Admitting: Sports Medicine

## 2024-02-03 ENCOUNTER — Encounter: Payer: Self-pay | Admitting: Sports Medicine

## 2024-02-03 ENCOUNTER — Ambulatory Visit

## 2024-02-03 DIAGNOSIS — S92352D Displaced fracture of fifth metatarsal bone, left foot, subsequent encounter for fracture with routine healing: Secondary | ICD-10-CM | POA: Diagnosis not present

## 2024-02-03 NOTE — Telephone Encounter (Signed)
 Alert received for possible increase fluid retention on diagnostics.   Routing to gen cards due to previous encounter on weight gain.

## 2024-02-03 NOTE — Progress Notes (Signed)
    Procedures performed today:    None.  Independent interpretation of notes and tests performed by another provider:   None.  Brief History, Exam, Impression, and Recommendations:    Fracture of base of fifth metatarsal bone of left foot Now 12 weeks status post what appeared to be a Jones fracture, pain-free, out of the postop shoe, x-rays personally reviewed, there is good bony callus bridging the fracture, I think this is healed, return as needed.    ____________________________________________ Joselyn Nicely. Sandy Crumb, M.D., ABFM., CAQSM., AME. Primary Care and Sports Medicine [facility]  Adjunct Professor of [facility] Medicine  [employer]  Restaurant manager, fast food

## 2024-02-03 NOTE — Assessment & Plan Note (Signed)
 Now 12 weeks status post what appeared to be a Jones fracture, pain-free, out of the postop shoe, x-rays personally reviewed, there is good bony callus bridging the fracture, I think this is healed, return as needed.

## 2024-02-04 ENCOUNTER — Encounter: Payer: Self-pay | Admitting: Physician Assistant

## 2024-02-04 ENCOUNTER — Ambulatory Visit (INDEPENDENT_AMBULATORY_CARE_PROVIDER_SITE_OTHER): Admitting: Physician Assistant

## 2024-02-04 VITALS — Ht 61.0 in | Wt 195.0 lb

## 2024-02-04 DIAGNOSIS — I5023 Acute on chronic systolic (congestive) heart failure: Secondary | ICD-10-CM

## 2024-02-04 DIAGNOSIS — K5909 Other constipation: Secondary | ICD-10-CM

## 2024-02-04 DIAGNOSIS — R635 Abnormal weight gain: Secondary | ICD-10-CM | POA: Diagnosis not present

## 2024-02-04 NOTE — Progress Notes (Signed)
 Patient is here for weight check.   Previous weight was 197.06lb  Today: 195lb  Pt increased torsemide  to 1 tablet twice a day for 3 days. Then resumed regular Torsemide  20mg  tablet daily. Pt states she feels constipated today.  Per Lindaann Requena, pt to continue taking Torsemide  20mg . Continue taking Amitiza and miralax  up to twice a day for constipation. 2 week follow-up for weight management.

## 2024-02-04 NOTE — Progress Notes (Signed)
 Weight has come down some. Pt denies any symptoms. Stay on maintance dose of toresmide for now. Work on constipation. Follow up in 2 weeks.

## 2024-02-06 ENCOUNTER — Ambulatory Visit: Payer: Self-pay | Admitting: Internal Medicine

## 2024-02-08 NOTE — Progress Notes (Signed)
 HPI: Follow-up nonischemic cardiomyopathy, history of ventricular tachycardia, obstructive sleep apnea, hypertension, hyperlipidemia, diabetes mellitus and history of complete heart block status post biventricular pacemaker. Previously followed by Dr. Hobart. Also followed by Dr. Waddell. Patient has a history of nonischemic cardiomyopathy with device placement in 2004. She had upgrade to CRT D in 2013. Last cardiac catheterization in June 2021 showed normal coronary arteries with ejection fraction 35 to 40%. Patient has not been able to tolerate vasodilators, carvedilol  discontinued due to hypotension, digoxin  discontinued due to rash and also felt fatigued and dehydrated with SGLT2 inhibitor. Previously seen in advanced heart failure clinic and felt not to be a candidate for advanced therapies including LVAD or home inotropes. She has been treated with amiodarone  for ventricular tachycardia. Most recent echocardiogram June 2024 at [facility] showed ejection fraction 20 to 25%, mild left ventricular enlargement, moderate left atrial enlargement, mild mitral regurgitation. Since last seen patient states that she has been placed on hormone cream which is called episodes of hot flashes associated with dyspnea.  She states she is more dyspneic recently and has gained several pounds.  She denies chest pain or syncope.  Mild pedal edema.  Current Outpatient Medications  Medication Sig Dispense Refill   albuterol  (PROVENTIL ) (2.5 MG/3ML) 0.083% nebulizer solution Take 2.5 mg by nebulization every 6 (six) hours as needed for wheezing or shortness of breath.     albuterol  (VENTOLIN  HFA) 108 (90 Base) MCG/ACT inhaler Inhale 1-2 puffs into the lungs every 6 (six) hours as needed for wheezing or shortness of breath. 54 g 3   alendronate  (FOSAMAX ) 70 MG tablet TAKE 1 TABLET BY MOUTH EVERY 7  DAYS WITH FULL GLASS OF WATER ON AN EMPTY STOMACH 12 tablet 3   AMBULATORY NON FORMULARY MEDICATION Medication Name: Tubing  and supplies for nebulizer. Fax - [phone] 1 vial 0   amiodarone  (PACERONE ) 200 MG tablet Take 1 tablet (200 mg total) by mouth daily. 90 tablet 3   aspirin  81 MG chewable tablet Chew 81 mg by mouth daily.     Benzoyl Peroxide  (PANOXYL FOAMING WASH) 10 % LIQD Wash groin creases daily with this wash  Ok to sub something similar 142 g 1   Calcium  Carbonate-Vit D-Min (CALCIUM  600+D PLUS MINERALS) 600-400 MG-UNIT TABS 1 tab p.o. twice daily 180 tablet 3   carvedilol  (COREG ) 3.125 MG tablet Take 1 tablet (3.125 mg total) by mouth 2 (two) times daily. 180 tablet 3   Continuous Glucose Receiver (FREESTYLE LIBRE 3 READER) DEVI Use for continuous glucose monitoring 1 each 0   Continuous Glucose Sensor (FREESTYLE LIBRE 3 SENSOR) MISC by Does not apply route. Change every 14 days     EPINEPHrine  0.3 mg/0.3 mL IJ SOAJ injection Inject 0.3 mg into the muscle as needed for anaphylaxis. 2 each 1   estradiol (ESTRACE) 0.1 MG/GM vaginal cream Place 1 Applicatorful vaginally 3 (three) times a week.     fexofenadine  (ALLEGRA ) 180 MG tablet Take 180 mg by mouth daily. Per patient break tablet in half taking 1/2 tab twice a day     fluticasone -salmeterol (WIXELA INHUB ) 250-50 MCG/ACT AEPB Inhale 1 puff into the lungs in the morning and at bedtime.     GEMTESA 75 MG TABS Take 75 mg by mouth daily.     glucose blood test strip To be used to check blood glucose Dx:E11.8 FreeStyle Precision blood glucose for Freestyle Libre 2 100 each 12   isosorbide  mononitrate (IMDUR ) 30 MG 24 hr tablet Take  1 tablet (30 mg total) by mouth daily. 90 tablet 3   levothyroxine  (SYNTHROID ) 112 MCG tablet TAKE 1 TABLET BY MOUTH DAILY 90 tablet 1   lubiprostone (AMITIZA) 24 MCG capsule Take 24 mcg by mouth 2 (two) times daily with a meal. Patient reports provider has decreased to every three days.     metFORMIN  (GLUCOPHAGE -XR) 500 MG 24 hr tablet TAKE 1 TABLET BY MOUTH TWICE  DAILY WITH A MEAL 160 tablet 3   Multiple Vitamins-Minerals  (MULTIPLE VITAMINS/WOMENS PO) Take 1 tablet by mouth daily.     Multiple Vitamins-Minerals (OCUVITE EYE + MULTI) TABS Take 1 tablet by mouth in the morning.     pantoprazole  (PROTONIX ) 40 MG tablet TAKE 1 TABLET BY MOUTH DAILY  BEFORE BREAKFAST (Patient taking differently: Take 40 mg by mouth 2 (two) times daily before a meal.) 90 tablet 3   pravastatin  (PRAVACHOL ) 40 MG tablet Take 40 mg by mouth daily.     Tiotropium Bromide  Monohydrate (SPIRIVA  RESPIMAT) 1.25 MCG/ACT AERS Inhale 1 puff into the lungs daily.     torsemide  (DEMADEX ) 20 MG tablet Take 1 tablet (20 mg total) by mouth daily. 90 tablet 3   valACYclovir  (VALTREX ) 1000 MG tablet Take 1 tablet (1,000 mg total) by mouth 2 (two) times daily. 14 tablet 2   No current facility-administered medications for this visit.     Past Medical History:  Diagnosis Date   Alkaline phosphatase elevation    Asthma    Breast cancer (HCC) 2011   Carpal tunnel syndrome    bilateral   Chronic HFrEF (heart failure with reduced ejection fraction) (HCC)    Cirrhosis, non-alcoholic (HCC)    Closed fracture of unspecified part of radius (alone)    DDD (degenerative disc disease), lumbar    Depression    pt denies   Diabetes mellitus without complication (HCC)    Enlarged heart    Fibromyalgia    GERD (gastroesophageal reflux disease)    History of kidney stones    HTN (hypertension)    Hypothyroidism    IBS (irritable bowel syndrome)    LBBB (left bundle branch block)    Microcytic anemia 05/14/2017   Mitral regurgitation    NICM (nonischemic cardiomyopathy) (HCC)    Pneumonia    Presence of permanent cardiac pacemaker    Pulmonary hypertension (HCC)    PVD (peripheral vascular disease) (HCC)    2019   Sleep apnea    uses cpap   Tricuspid regurgitation    Ventricular tachyarrhythmia [facility])     Past Surgical History:  Procedure Laterality Date   BIV PACEMAKER GENERATOR CHANGEOUT N/A 11/03/2021   Procedure: BIV PACEMAKER GENERATOR  CHANGEOUT;  Surgeon: Waddell Danelle ORN, MD;  Location: [facility] INVASIVE CV LAB;  Service: Cardiovascular;  Laterality: N/A;   BREAST LUMPECTOMY Left 2011   COLONOSCOPY     FOOT SURGERY     KNEE SURGERY     LEAD REVISION/REPAIR N/A 11/03/2021   Procedure: LEAD REVISION/REPAIR;  Surgeon: Waddell Danelle ORN, MD;  Location: [facility] INVASIVE CV LAB;  Service: Cardiovascular;  Laterality: N/A;   MASTECTOMY Right    05/2019   PACEMAKER GENERATOR CHANGE N/A 08/23/2014   Procedure: PACEMAKER GENERATOR CHANGE;  Surgeon: Danelle ORN Waddell, MD;  Location: [facility] CATH LAB;  Service: Cardiovascular;  Laterality: N/A;   PACEMAKER PLACEMENT  2009   [facility]   RECTAL SURGERY  1976   REDUCTION MAMMAPLASTY Left    05/2019   TOTAL SHOULDER ARTHROPLASTY Right 03/10/2018  TOTAL SHOULDER ARTHROPLASTY Right 03/10/2018   Procedure: RIGHT TOTAL SHOULDER ARTHROPLASTY;  Surgeon: Dozier Soulier, MD;  Location: [facility] OR;  Service: Orthopedics;  Laterality: Right;   TUBAL LIGATION      Social History   Socioeconomic History   Marital status: Divorced    Spouse name: Not on file   Number of children: 2   Years of education: 14   Highest education level: Associate degree: academic program  Occupational History   Occupation: quality insurcance in Sports coach    Comment: retired  Tobacco Use   Smoking status: Former    Current packs/day: 0.00    Types: Cigarettes    Quit date: 08/24/1966    Years since quitting: 57.5   Smokeless tobacco: Never  Vaping Use   Vaping status: Never Used  Substance and Sexual Activity   Alcohol use: No   Drug use: No   Sexual activity: Not Currently  Other Topics Concern   Not on file  Social History Narrative   Retired. Lives with and helps her brother. Likes Risk manager, Nutritional therapist and music. Loves animals.   Social Drivers of Health   Financial Resource Strain: Medium Risk (08/10/2023)   Overall Financial Resource Strain (CARDIA)    Difficulty of Paying Living  Expenses: Somewhat hard  Food Insecurity: No Food Insecurity (11/17/2023)   Hunger Vital Sign    Worried About Running Out of Food in the Last Year: Never true    Ran Out of Food in the Last Year: Never true  Recent Concern: Food Insecurity - Food Insecurity Present (08/23/2023)   Hunger Vital Sign    Worried About Running Out of Food in the Last Year: Sometimes true    Ran Out of Food in the Last Year: Sometimes true  Transportation Needs: No Transportation Needs (11/17/2023)   PRAPARE - Administrator, Civil Service (Medical): No    Lack of Transportation (Non-Medical): No  Physical Activity: Sufficiently Active (08/10/2023)   Exercise Vital Sign    Days of Exercise per Week: 3 days    Minutes of Exercise per Session: 50 min  Recent Concern: Physical Activity - Insufficiently Active (07/12/2023)   Exercise Vital Sign    Days of Exercise per Week: 1 day    Minutes of Exercise per Session: 10 min  Stress: Stress Concern Present (08/10/2023)   [employer] of Occupational Health - Occupational Stress Questionnaire    Feeling of Stress : To some extent  Social Connections: Moderately Integrated (08/10/2023)   Social Connection and Isolation Panel    Frequency of Communication with Friends and Family: More than three times a week    Frequency of Social Gatherings with Friends and Family: More than three times a week    Attends Religious Services: More than 4 times per year    Active Member of [employer] or Organizations: Yes    Attends Engineer, structural: More than 4 times per year    Marital Status: Divorced  Intimate Partner Violence: Not At Risk (11/17/2023)   Humiliation, Afraid, Rape, and Kick questionnaire    Fear of Current or Ex-Partner: No    Emotionally Abused: No    Physically Abused: No    Sexually Abused: No    Family History  Problem Relation Age of Onset   Heart disease Mother    Melanoma Mother    Parkinson's disease Mother    Heart disease  Father    Heart disease Brother    Heart  failure Other    Breast cancer Other    Hypertension Other     ROS: no fevers or chills, productive cough, hemoptysis, dysphasia, odynophagia, melena, hematochezia, dysuria, hematuria, rash, seizure activity, orthopnea, PND, pedal edema, claudication. Remaining systems are negative.  Physical Exam: Well-developed well-nourished in no acute distress.  Skin is warm and dry.  HEENT is normal.  Neck is supple.  Chest is clear to auscultation with normal expansion.  Cardiovascular exam is regular rate and rhythm.  Abdominal exam nontender or distended. No masses palpated. Extremities show trace edema. neuro grossly intact   A/P  1 nonischemic cardiomyopathy/chronic systolic congestive heart failure-previous heart catheterization revealed normal coronary arteries.  She previously had hyperkalemia with ACE inhibitors and ARB's and also did not tolerate higher doses of carvedilol .  Will continue present dose as well as nitrates (hydralazine  discontinued previously due to low blood pressure by report).  Previously complained of fatigue/dehydration with SGLT2 inhibitor.  Also seen previously in advanced heart failure clinic and felt not to be candidate for advanced therapies including LVAD and inotropes.  Patient describes increased dyspnea and and weight gain of several pounds recently.  Not markedly volume overloaded on exam.  I have asked her to take an additional Demadex  today and tomorrow then resume previous dose.  Will add spironolactone  12.5 mg daily to see if she tolerates.  Check potassium and renal function in 1 week.  2 ventricular tachycardia-continue amiodarone  200 mg daily.  Check chest x-ray and liver functions.  Recent TSH normal.  3 CRT-D-Per EP.  Redell Shallow, MD

## 2024-02-09 ENCOUNTER — Other Ambulatory Visit: Payer: Self-pay | Admitting: Family Medicine

## 2024-02-09 DIAGNOSIS — G4733 Obstructive sleep apnea (adult) (pediatric): Secondary | ICD-10-CM | POA: Diagnosis not present

## 2024-02-09 DIAGNOSIS — E038 Other specified hypothyroidism: Secondary | ICD-10-CM

## 2024-02-10 ENCOUNTER — Ambulatory Visit: Admitting: Family Medicine

## 2024-02-15 ENCOUNTER — Telehealth: Payer: Self-pay

## 2024-02-15 DIAGNOSIS — K581 Irritable bowel syndrome with constipation: Secondary | ICD-10-CM | POA: Diagnosis not present

## 2024-02-15 NOTE — Telephone Encounter (Signed)
Please call patient and see what her concern is? 

## 2024-02-15 NOTE — Telephone Encounter (Signed)
 Copied from CRM [id number]. Topic: Clinical - Medication Question >> Feb 15, 2024 12:19 PM Tanya Hogan wrote: Patient would like a call back from Dr Alvan, regarding the medication: GEMTESA 75 MG TABS. Questioning if she should stop taking it.   Please advise

## 2024-02-16 ENCOUNTER — Encounter: Payer: Self-pay | Admitting: Cardiology

## 2024-02-16 ENCOUNTER — Ambulatory Visit (INDEPENDENT_AMBULATORY_CARE_PROVIDER_SITE_OTHER): Admitting: Cardiology

## 2024-02-16 ENCOUNTER — Ambulatory Visit
Admission: RE | Admit: 2024-02-16 | Discharge: 2024-02-16 | Disposition: A | Discharge status: Home / Self Care | Source: Ambulatory Visit | Attending: Cardiology | Admitting: Cardiology

## 2024-02-16 VITALS — BP 117/68 | HR 90 | Ht 61.0 in | Wt 198.8 lb

## 2024-02-16 DIAGNOSIS — R059 Cough, unspecified: Secondary | ICD-10-CM | POA: Diagnosis not present

## 2024-02-16 DIAGNOSIS — I5022 Chronic systolic (congestive) heart failure: Secondary | ICD-10-CM | POA: Insufficient documentation

## 2024-02-16 DIAGNOSIS — R0989 Other specified symptoms and signs involving the circulatory and respiratory systems: Secondary | ICD-10-CM | POA: Diagnosis not present

## 2024-02-16 DIAGNOSIS — I428 Other cardiomyopathies: Secondary | ICD-10-CM | POA: Diagnosis not present

## 2024-02-16 DIAGNOSIS — R748 Abnormal levels of other serum enzymes: Secondary | ICD-10-CM | POA: Insufficient documentation

## 2024-02-16 DIAGNOSIS — I472 Ventricular tachycardia, unspecified: Secondary | ICD-10-CM

## 2024-02-16 DIAGNOSIS — I517 Cardiomegaly: Secondary | ICD-10-CM | POA: Diagnosis not present

## 2024-02-16 DIAGNOSIS — Z96611 Presence of right artificial shoulder joint: Secondary | ICD-10-CM | POA: Diagnosis not present

## 2024-02-16 MED ORDER — SPIRONOLACTONE 25 MG PO TABS: 12.5000 mg | ORAL_TABLET | Freq: Every day | ORAL | 3 refills | Status: DC

## 2024-02-16 NOTE — Telephone Encounter (Signed)
 Called pt and lvm asking that she return call to discuss medication.

## 2024-02-16 NOTE — Patient Instructions (Signed)
 Medication Instructions:   START SPIRONOLACTONE  12.5 MG ONCE DAILY= 1/2 OF THE 25 MG TABLET ONCE DAILY  TAKE EXTRA DOSE OF TORSEMIDE  TODAY AND TOMORROW  *If you need a refill on your cardiac medications before your next appointment, please call your pharmacy*  Lab Work:  Your physician recommends that you return for lab work in: ONE WEEK-DO NOT NEED TO FAST  [facility]  Located on the 3 rd floor in [address] Hours-Monday - Friday 8 am-11:30 AM and 1 pm -4 pm   If you have labs (blood work) drawn today and your tests are completely normal, you will receive your results only by: MyChart Message (if you have MyChart) OR A paper copy in the mail If you have any lab test that is abnormal or we need to change your treatment, we will call you to review the results.  Testing/Procedures:  A chest x-ray takes a picture of the organs and structures inside the chest, including the heart, lungs, and blood vessels. This test can show several things, including, whether the heart is enlarges; whether fluid is building up in the lungs; and whether pacemaker / defibrillator leads are still in place. [facility] 1 ST FLOOR IMAGING DEPARTMENT  Follow-Up: At [facility], you and your health needs are our priority.  As part of our continuing mission to provide you with exceptional heart care, our providers are all part of one team.  This team includes your primary Cardiologist (physician) and Advanced Practice Providers or APPs (Physician Assistants and Nurse Practitioners) who all work together to provide you with the care you need, when you need it.  Your next appointment:   6 month(s)  Provider:   Redell Shallow, MD

## 2024-02-17 ENCOUNTER — Ambulatory Visit: Payer: Self-pay | Admitting: Cardiology

## 2024-02-18 ENCOUNTER — Ambulatory Visit (INDEPENDENT_AMBULATORY_CARE_PROVIDER_SITE_OTHER): Admitting: Family Medicine

## 2024-02-18 ENCOUNTER — Ambulatory Visit: Payer: Self-pay | Admitting: *Deleted

## 2024-02-18 VITALS — BP 126/55 | HR 68 | Ht 61.0 in | Wt 198.0 lb

## 2024-02-18 DIAGNOSIS — R635 Abnormal weight gain: Secondary | ICD-10-CM

## 2024-02-18 NOTE — Telephone Encounter (Signed)
 FYI Only or Action Required?: FYI only for provider.  Patient was last seen in primary care on 02/18/2024 by [name], MD. Called Nurse Triage reporting Diabetes supplies . Symptoms began NA. Interventions attempted: Other: NA. Symptoms are: na.  Triage Disposition: Information or Advice Only Call  Patient/caregiver understands and will follow disposition?:   Reason for Disposition  Caller hangs up  Answer Assessment - Initial Assessment Questions 1. SITUATION:  Document reason for call.     Returning call to patient. 2. BACKGROUND: Document any background information (e.g., prior calls, known psychiatric history)     Received CRM from specialist that patient has questions re: glucose sensor 3. ASSESSMENT: Document your nursing assessment.     Unable to assess. Called to patient identified who this Clinical research associate is and Tanya Hogan stated I do not need a triage nurse and hung up the phone.  4. RESPONSE: Document what your response or recommendation was.     Unable to respond  Protocols used: Difficult Call-A-AH

## 2024-02-18 NOTE — Telephone Encounter (Signed)
 Spoke to pt today while she was here for her NV and she stated that she spoke to the PA that wrote this medication for her .

## 2024-02-18 NOTE — Telephone Encounter (Signed)
 Attempted to contact patient regarding her sensor question- no answer- left message to call office   Copied from CRM [id number]. Topic: Clinical - Prescription Issue >> Feb 18, 2024  3:40 PM Merlynn A wrote: Reason for CRM: Patient called in wanting clarification on Continuous Glucose Sensor (FREESTYLE LIBRE 3 SENSOR) MISC. Please contact patient to advise who is sending prescription and where its being sent to. Please contact patient back today at [phone].

## 2024-02-18 NOTE — Progress Notes (Signed)
 Pt here today to have her weight checked.  Her previous weight was 195lb  Today:198lb  She has been taking Torsemide  20 mg and Amitiza and a fiber laxitive (pt cannot take miralax ).   Pt reports that she has had diarrhea all day.  She also had an appointment with Dr. Pietro today and he started her on Spironolactone  12.5 mg every day (pt to cut 25 mg in half)  Take extra dose of Torsemide  today and tomorrow.   Pt has f/u appointment with pcp 7/28

## 2024-02-21 NOTE — Telephone Encounter (Signed)
 Spoke with patient requesting that the freestyle libre 3 sensor prescription be sent to [facility]  Last written by historical provider Last OV 02/18/2024 Upcoming appt 03/20/2024

## 2024-02-21 NOTE — Addendum Note (Signed)
 Addended by: Maegen Wigle P on: 02/21/2024 04:26 PM   Modules accepted: Orders

## 2024-02-24 DIAGNOSIS — E118 Type 2 diabetes mellitus with unspecified complications: Secondary | ICD-10-CM | POA: Diagnosis not present

## 2024-02-27 DIAGNOSIS — G4733 Obstructive sleep apnea (adult) (pediatric): Secondary | ICD-10-CM | POA: Diagnosis not present

## 2024-02-29 DIAGNOSIS — I5022 Chronic systolic (congestive) heart failure: Secondary | ICD-10-CM | POA: Diagnosis not present

## 2024-02-29 DIAGNOSIS — R748 Abnormal levels of other serum enzymes: Secondary | ICD-10-CM | POA: Diagnosis not present

## 2024-03-03 LAB — BASIC METABOLIC PANEL WITH GFR
BUN/Creatinine Ratio: 23 (ref 12–28)
BUN: 23 mg/dL (ref 8–27)
CO2: 20 mmol/L (ref 20–29)
Calcium: 10.5 mg/dL — ABNORMAL HIGH (ref 8.7–10.3)
Chloride: 102 mmol/L (ref 96–106)
Creatinine, Ser: 1 mg/dL (ref 0.57–1.00)
Glucose: 90 mg/dL (ref 70–99)
Potassium: 5.3 mmol/L — ABNORMAL HIGH (ref 3.5–5.2)
Sodium: 139 mmol/L (ref 134–144)
eGFR: 59 mL/min/1.73 — ABNORMAL LOW (ref >59)

## 2024-03-03 LAB — HEPATIC FUNCTION PANEL
ALT: 32 IU/L (ref 0–32)
AST: 48 IU/L — ABNORMAL HIGH (ref 0–40)
Albumin: 4.1 g/dL (ref 3.8–4.8)
Alkaline Phosphatase: 132 IU/L — ABNORMAL HIGH (ref 44–121)
Bilirubin Total: 0.6 mg/dL (ref 0.0–1.2)
Bilirubin, Direct: 0.23 mg/dL (ref 0.00–0.40)
Total Protein: 6.7 g/dL (ref 6.0–8.5)

## 2024-03-10 DIAGNOSIS — G4733 Obstructive sleep apnea (adult) (pediatric): Secondary | ICD-10-CM | POA: Diagnosis not present

## 2024-03-20 ENCOUNTER — Ambulatory Visit: Admitting: Family Medicine

## 2024-03-27 ENCOUNTER — Ambulatory Visit: Payer: Self-pay | Admitting: *Deleted

## 2024-03-27 NOTE — Telephone Encounter (Signed)
 Message from [place] G sent at 03/27/2024  3:18 PM EDT  Patient Grade calling regarding her Covid Shot, does she need one or not.  Received a message saying she if overdue but pharmacy is saying she does not need one now.   Please advise    Call History  Contact Date/Time Type Contact Phone/Fax By  03/27/2024 03:15 PM EDT Phone (Incoming) Tanya Hogan, Tanya Hogan (Self) [phone] [name]

## 2024-03-27 NOTE — Telephone Encounter (Signed)
 First attempt to return pt's call.   Left  a voicemail to call back.  Will route back to triage queue for future attempts.

## 2024-03-28 ENCOUNTER — Telehealth: Payer: Self-pay

## 2024-03-29 NOTE — Telephone Encounter (Signed)
 Organization it sounds like we probably just do not have her last 1 abstracted especially at the pharmacy has it up-to-date but we do not like.  She should not need another one until later this fall.  Pls abstract

## 2024-03-29 NOTE — Telephone Encounter (Signed)
 Pt has been advised.

## 2024-04-06 ENCOUNTER — Ambulatory Visit: Payer: Self-pay

## 2024-04-06 NOTE — Telephone Encounter (Signed)
 FYI Only or Action Required?: Action required by provider: request for appointment and clinical question for provider.  Patient was last seen in primary care on 02/18/2024 by [name], MD.  Called Nurse Triage reportingwanting CGM that sticks to abdomen- keeps falling off arm   Symptoms began several days ago.  Interventions attempted: Other: called manufacturer and stuck bandaid on top .  Symptoms are: unchanged.  Triage Disposition: Call PCP Now  Patient/caregiver understands and will follow disposition?: yes Pt stated she called [facility] who has the prescription [phone]        Copied from CRM [id number]. Topic: Clinical - Red Word Triage >> Apr 06, 2024  9:02 AM Susanna ORN wrote: Red Word that prompted transfer to Nurse Triage: Patient states she has been using Libre 3 Freestyle sensor and can't keep it on. States she has cleaned her arm but it will not stay on. Patient states her arm is jelly-like in the muscle. Wants to know if there may be a different brand that she can get or what does she need to do? Reason for Disposition  Nursing judgment or information in reference  Answer Assessment - Initial Assessment Questions 1. REASON FOR CALL: What is your main concern right now?     Cannot get libre to stick to skin 2. ONSET: When did the sx start?     Change to  [place] 3 stuck on  7. OTHER SYMPTOMS: Do you have any other new symptoms?    8. TREATMENTS AND RESPONSE: What have you done so far to try to make this better? What medicines have you used?     Asking for a CGM that can go off on stomach stated skin  Has called the company and nothing is working to stay on.  Protocols used: No Guideline Available-A-AH

## 2024-04-06 NOTE — Addendum Note (Signed)
 Addended by: VICCI SELLER A on: 04/06/2024 11:52 AM   Modules accepted: Orders

## 2024-04-06 NOTE — Telephone Encounter (Signed)
 We could try to schedule her with [place] our clinical pharmacist to make sure that she is able to apply it properly we might have to schedule an appointment.  She definitely can use a Band-Aid or tape over top of it to hold it in place.  I assume she is using the 3+?  We had sent a prescription in but she could still have an old 1.

## 2024-04-06 NOTE — Progress Notes (Signed)
 Remote pacemaker transmission.

## 2024-04-06 NOTE — Telephone Encounter (Signed)
 Opened in error

## 2024-04-07 ENCOUNTER — Telehealth: Payer: Self-pay

## 2024-04-07 NOTE — Telephone Encounter (Signed)
 Copied from CRM #[id number]. Topic: Clinical - Request for Lab/Test Order >> Apr 07, 2024  8:29 AM Miquel SAILOR wrote: Reason for CRM: Hepatic function panel-Patient caldling due to notification for [facility] lab. Called office confirmed that HFP is the same as the CMP. No further questions PT completed lab work as of 07/08

## 2024-04-10 ENCOUNTER — Other Ambulatory Visit: Payer: Self-pay | Admitting: Family Medicine

## 2024-04-10 MED ORDER — FREESTYLE LIBRE 3 READER DEVI: 0 refills | Status: DC

## 2024-04-10 NOTE — Telephone Encounter (Signed)
 We can switch but she would have to make sure that her insurance covers it.  They usually either cover one of the other day do not usually cover both.

## 2024-04-10 NOTE — Telephone Encounter (Signed)
 Copied from CRM #[id number]. Topic: Clinical - Medication Refill >> Apr 10, 2024  9:43 AM Antonio H wrote: Medication: Continuous Glucose Receiver (FREESTYLE LIBRE 3 READER) (you have lost or misplaced reader and cannot check blood sugar)  Has the patient contacted their pharmacy? Yes (Agent: If no, request that the patient contact the pharmacy for the refill. If patient does not wish to contact the pharmacy document the reason why and proceed with request.) (Agent: If yes, when and what did the pharmacy advise?) they cannot give her a reader without prescription  This is the patient's preferred pharmacy:  [id number] - [place], [place] - [address] [facility] [address] [place] [address] Phone: [phone] Fax: [phone]       Is this the correct pharmacy for this prescription? Yes If no, delete pharmacy and type the correct one.   Has the prescription been filled recently? No  Is the patient out of the medication? Yes  Has the patient been seen for an appointment in the last year OR does the patient have an upcoming appointment? Yes  Can we respond through MyChart? Yes  Agent: Please be advised that Rx refills may take up to 3 business days. We ask that you follow-up with your pharmacy.

## 2024-04-11 DIAGNOSIS — K581 Irritable bowel syndrome with constipation: Secondary | ICD-10-CM | POA: Diagnosis not present

## 2024-04-11 DIAGNOSIS — K7581 Nonalcoholic steatohepatitis (NASH): Secondary | ICD-10-CM | POA: Diagnosis not present

## 2024-04-11 DIAGNOSIS — K9289 Other specified diseases of the digestive system: Secondary | ICD-10-CM | POA: Diagnosis not present

## 2024-04-11 NOTE — Telephone Encounter (Signed)
 Patient will come in for nurse visit on 04/13/2024 since current device has fallen off of arm She will call her insurance company to see if Dexcom is covered under her plan.

## 2024-04-13 ENCOUNTER — Ambulatory Visit

## 2024-04-13 DIAGNOSIS — K5900 Constipation, unspecified: Secondary | ICD-10-CM | POA: Diagnosis not present

## 2024-04-13 DIAGNOSIS — L0100 Impetigo, unspecified: Secondary | ICD-10-CM | POA: Diagnosis not present

## 2024-04-14 ENCOUNTER — Ambulatory Visit (INDEPENDENT_AMBULATORY_CARE_PROVIDER_SITE_OTHER): Admitting: Physician Assistant

## 2024-04-14 VITALS — Ht 61.0 in | Wt 198.0 lb

## 2024-04-14 DIAGNOSIS — Z7984 Long term (current) use of oral hypoglycemic drugs: Secondary | ICD-10-CM

## 2024-04-14 DIAGNOSIS — E118 Type 2 diabetes mellitus with unspecified complications: Secondary | ICD-10-CM

## 2024-04-14 NOTE — Progress Notes (Signed)
 Pt presented for [facility] 3 placement.  Location: LD Tolerated well.

## 2024-04-17 ENCOUNTER — Ambulatory Visit: Payer: Self-pay | Admitting: *Deleted

## 2024-04-17 ENCOUNTER — Encounter: Payer: Self-pay | Admitting: Medical-Surgical

## 2024-04-17 ENCOUNTER — Ambulatory Visit (INDEPENDENT_AMBULATORY_CARE_PROVIDER_SITE_OTHER): Admitting: Medical-Surgical

## 2024-04-17 VITALS — BP 98/60 | HR 64 | Resp 20 | Ht 61.0 in | Wt 194.0 lb

## 2024-04-17 DIAGNOSIS — H10533 Contact blepharoconjunctivitis, bilateral: Secondary | ICD-10-CM

## 2024-04-17 MED ORDER — POLYMYXIN B-TRIMETHOPRIM 10000-0.1 UNIT/ML-% OP SOLN: 1.0000 [drp] | Freq: Four times a day (QID) | OPHTHALMIC | 0 refills | Status: AC

## 2024-04-17 NOTE — Progress Notes (Signed)
        Established patient visit   History of Present Illness   Discussed the use of AI scribe software for clinical note transcription with the patient, who gave verbal consent to proceed.  History of Present Illness   Tanya Hogan is a 73 year old female who presents with red, irritated eyes following exposure to dust and cobwebs.  Ocular irritation and redness - Redness, irritation, and gritty sensation in both eyes for the past week following exposure to dust and cobwebs - Intermittent drainage and crusting of the eyes upon waking - Occasional blurred vision, more pronounced in the left eye - Symptoms worsen if ocular ointment is missed for a day - Uses an ointment for severe dry eyes, described as similar to 'old Vaseline' - Erythromycin  ointment has provided some relief in the past but didn't fix it      Physical Exam   Physical Exam Vitals reviewed.  Constitutional:      General: She is not in acute distress.    Appearance: Normal appearance. She is not ill-appearing.  HENT:     Head: Normocephalic and atraumatic.  Eyes:     General:        Right eye: Discharge present.        Left eye: Discharge present.    Extraocular Movements: Extraocular movements intact.     Conjunctiva/sclera:     Right eye: Right conjunctiva is injected.     Left eye: Left conjunctiva is injected.  Cardiovascular:     Rate and Rhythm: Normal rate and regular rhythm.     Pulses: Normal pulses.     Heart sounds: Normal heart sounds. No murmur heard.    No friction rub. No gallop.  Pulmonary:     Effort: Pulmonary effort is normal. No respiratory distress.     Breath sounds: Normal breath sounds. No wheezing.  Skin:    General: Skin is warm and dry.  Neurological:     Mental Status: She is alert and oriented to person, place, and time.  Psychiatric:        Mood and Affect: Mood normal.        Behavior: Behavior normal.        Thought Content: Thought content normal.         Judgment: Judgment normal.    Assessment & Plan   Assessment and Plan    Bilateral conjunctivitis Likely secondary to dust exposure. Differential includes bacterial conjunctivitis due to crusting and discharge. Symptoms and exposure suggest infection. Erythromycin  ointment provided relief but did not resolve condition. Careful antibiotic selection needed due to allergy history. - Prescribed Polytrim  eye drops, one drop in each eye four times daily for seven days. - Monitor symptoms, reassess if no improvement after seven days. - Contact on Thursday to evaluate progress and consider oral antibiotics. - Advised to avoid dust exposure.      Follow up   Return if symptoms worsen or fail to improve. __________________________________ Zada FREDRIK Palin, DNP, APRN, FNP-BC Primary [facility] and [facility]

## 2024-04-17 NOTE — Telephone Encounter (Signed)
 Patient seen in office 04/17/2024 with Tanya Hogan. NP

## 2024-04-17 NOTE — Telephone Encounter (Signed)
 Copied from CRM #[id number]. Topic: Clinical - Red Word Triage >> Apr 17, 2024  7:39 AM Farrel B wrote: [facility] that prompted transfer to Nurse Triage: patient is having red itchy burning eyes, and possible stye both eyes are extremely painful Reason for Disposition  [1] MILD eyelid pain is a recurrent problem AND [2] red and crusty eyelids  Answer Assessment - Initial Assessment Questions 1. ONSET: When did the pain start? (e.g., minutes, hours, days)     I cleaned out a closet over a week ago.   Now my eyes are bothering me.   I've done everything for my eyes that I know to do. My eyes are red, itching, the blurred vision has cleared up in the left eye.   The right eye was not blurry.   I feel like a stye is trying to come up.    My eyes are not watery now.   I did have sandy looking stuff that was dry and crusty coming out of my eyes.   I don't want to get an infection in my eyes. 2. TIMING: Does the pain come and go, or has it been constant since it started? (e.g., constant, intermittent, fleeting)     Constant 3. SEVERITY: How bad is the pain?  (Scale 1-10; mild, moderate or severe)     Burning some and very itchy.    4. LOCATION: Where does it hurt?  (e.g., eyelid, eye, cheekbone)     Both eyes 5. CAUSE: What do you think is causing the pain?     I cleaned out a closet and was exposed to dirt and cobwebs.   6. VISION: Do you have blurred vision or changes in your vision?      My vision was blurry but now much better. 7. EYE DISCHARGE: Is there any discharge (pus) from the eye(s)?  If Yes, ask: What color is it?      I did have crusty discharge bad but now it's better.   It's just so itchy. 8. FEVER: Do you have a fever? If Yes, ask: What is it, how was it measured, and when did it start?      No 9. OTHER SYMPTOMS: Do you have any other symptoms? (e.g., headache, nasal discharge, facial rash)     See above 10. PREGNANCY: Is there any chance you are pregnant?  When was your last menstrual period?       N/A due to age  Protocols used: Eye Pain and Other Symptoms-A-AH FYI Only or Action Required?: FYI only for provider.  Patient was last seen in primary care on 04/14/2024 by Antoniette Vermell CROME, PA-C.  Called Nurse Triage reporting Eye Pain.itching real bad, red, crusty   Symptoms began a week ago.  Interventions attempted: OTC medications: eyedrops.  Symptoms are: gradually worsening.  Triage Disposition: See PCP Within 2 Weeks  Patient/caregiver understands and will follow disposition?: Yes

## 2024-04-18 ENCOUNTER — Other Ambulatory Visit: Payer: Self-pay | Admitting: Family Medicine

## 2024-04-18 DIAGNOSIS — Z1231 Encounter for screening mammogram for malignant neoplasm of breast: Secondary | ICD-10-CM | POA: Diagnosis not present

## 2024-04-18 DIAGNOSIS — R92323 Mammographic fibroglandular density, bilateral breasts: Secondary | ICD-10-CM | POA: Diagnosis not present

## 2024-04-18 DIAGNOSIS — E1169 Type 2 diabetes mellitus with other specified complication: Secondary | ICD-10-CM

## 2024-04-18 DIAGNOSIS — E038 Other specified hypothyroidism: Secondary | ICD-10-CM

## 2024-04-18 LAB — HM MAMMOGRAPHY

## 2024-04-21 ENCOUNTER — Encounter: Payer: Self-pay | Admitting: Family Medicine

## 2024-04-21 DIAGNOSIS — Z950 Presence of cardiac pacemaker: Secondary | ICD-10-CM | POA: Diagnosis not present

## 2024-04-21 DIAGNOSIS — J438 Other emphysema: Secondary | ICD-10-CM | POA: Diagnosis not present

## 2024-04-21 DIAGNOSIS — C50212 Malignant neoplasm of upper-inner quadrant of left female breast: Secondary | ICD-10-CM | POA: Diagnosis not present

## 2024-04-21 DIAGNOSIS — Z170 Estrogen receptor positive status [ER+]: Secondary | ICD-10-CM | POA: Diagnosis not present

## 2024-04-21 DIAGNOSIS — M8000XA Age-related osteoporosis with current pathological fracture, unspecified site, initial encounter for fracture: Secondary | ICD-10-CM | POA: Diagnosis not present

## 2024-04-21 DIAGNOSIS — I10 Essential (primary) hypertension: Secondary | ICD-10-CM | POA: Diagnosis not present

## 2024-04-21 DIAGNOSIS — D0511 Intraductal carcinoma in situ of right breast: Secondary | ICD-10-CM | POA: Diagnosis not present

## 2024-04-21 DIAGNOSIS — E119 Type 2 diabetes mellitus without complications: Secondary | ICD-10-CM | POA: Diagnosis not present

## 2024-04-21 DIAGNOSIS — E039 Hypothyroidism, unspecified: Secondary | ICD-10-CM | POA: Diagnosis not present

## 2024-04-21 DIAGNOSIS — K7581 Nonalcoholic steatohepatitis (NASH): Secondary | ICD-10-CM | POA: Diagnosis not present

## 2024-04-21 DIAGNOSIS — Z1231 Encounter for screening mammogram for malignant neoplasm of breast: Secondary | ICD-10-CM | POA: Diagnosis not present

## 2024-04-24 DIAGNOSIS — E118 Type 2 diabetes mellitus with unspecified complications: Secondary | ICD-10-CM | POA: Diagnosis not present

## 2024-04-25 ENCOUNTER — Encounter: Payer: Self-pay | Admitting: Sports Medicine

## 2024-05-01 ENCOUNTER — Telehealth: Payer: Self-pay

## 2024-05-01 DIAGNOSIS — J4540 Moderate persistent asthma, uncomplicated: Secondary | ICD-10-CM | POA: Diagnosis not present

## 2024-05-01 DIAGNOSIS — J301 Allergic rhinitis due to pollen: Secondary | ICD-10-CM | POA: Diagnosis not present

## 2024-05-01 DIAGNOSIS — E118 Type 2 diabetes mellitus with unspecified complications: Secondary | ICD-10-CM

## 2024-05-01 DIAGNOSIS — J3081 Allergic rhinitis due to animal (cat) (dog) hair and dander: Secondary | ICD-10-CM | POA: Diagnosis not present

## 2024-05-01 DIAGNOSIS — L509 Urticaria, unspecified: Secondary | ICD-10-CM | POA: Diagnosis not present

## 2024-05-01 DIAGNOSIS — J3089 Other allergic rhinitis: Secondary | ICD-10-CM | POA: Diagnosis not present

## 2024-05-01 NOTE — Telephone Encounter (Signed)
 At last conversation regarding this patient was going to contact her insurance company to see if Dexcom would be a covered alternative. Attempted call to patient ot inquire about this but had to leave a voice mail  message requesting a return call.

## 2024-05-01 NOTE — Telephone Encounter (Signed)
 Copied from CRM [id number]. Topic: Clinical - Medication Question >> May 01, 2024  7:49 AM Diannia H wrote: Reason for CRM: Patient wanted the provider to know she can't keep her Herlene on her arm and when she went in the office they put one on and Friday it came off and she is wanting to know what she needs to do. Could you assist? Patients callback number is [phone].

## 2024-05-01 NOTE — Telephone Encounter (Signed)
 It sounds like she is probably just can have to go back to doing fingersticks daily.  If they just want stay can stay on.  She does not need an insulin  delivery system so that is not going to work.  She just needs a continuous glucose meter and again if they do not stay on with the 1 that they will cover she is just can have to go back to doing fingersticks.

## 2024-05-01 NOTE — Telephone Encounter (Signed)
 Spoke with patient . States that when device fell off on Friday 04/28/2024 she had 16 more hours left to go on the device.  She states she called the insurance company and was told that they do not cover Dexcom but would cover Omni but when checked on this CGM states :An Omni continuous glucose monitor (CGM) refers to the Dexcom CGM integrated with an Omnipod tubeless insulin  delivery system, forming the Omnipod 5 Automated Insulin  Delivery (AID) System.  Patient would like to know of anything provider would suggest?

## 2024-05-02 ENCOUNTER — Ambulatory Visit: Payer: Medicare Other | Attending: Internal Medicine

## 2024-05-02 DIAGNOSIS — I428 Other cardiomyopathies: Secondary | ICD-10-CM | POA: Diagnosis not present

## 2024-05-02 MED ORDER — GLUCOSE BLOOD VI STRP: ORAL_STRIP | 12 refills | Status: DC

## 2024-05-02 NOTE — Telephone Encounter (Signed)
 Spoke with patient. She would like to continue with the CGM  and if it falls off she will use the fingerstick option on the freestyle libre 3 ( states the freestyle libre 2 test strips will fit in the freestyle libre 3  that she currently Has )  Requesting a prescription refill of these strips Sent this for your review. I did not know this was an option for the device Pended them below.

## 2024-05-03 LAB — CUP PACEART REMOTE DEVICE CHECK
Battery Remaining Longevity: 59 mo
Battery Voltage: 2.98 V
Brady Statistic AP VP Percent: 59.5 %
Brady Statistic AP VS Percent: 0.13 %
Brady Statistic AS VP Percent: 38.25 %
Brady Statistic AS VS Percent: 2.12 %
Brady Statistic RA Percent Paced: 60.65 %
Brady Statistic RV Percent Paced: 97.75 %
Date Time Interrogation Session: 20250909014605
Implantable Lead Connection Status: 753985
Implantable Lead Connection Status: 753985
Implantable Lead Connection Status: 753985
Implantable Lead Implant Date: 20030324
Implantable Lead Implant Date: 20030324
Implantable Lead Implant Date: 20230313
Implantable Lead Location: 753858
Implantable Lead Location: 753859
Implantable Lead Location: 753860
Implantable Lead Model: 3830
Implantable Lead Model: 4068
Implantable Lead Model: 4193
Implantable Pulse Generator Implant Date: 20230313
Lead Channel Impedance Value: 1026 Ohm
Lead Channel Impedance Value: 3306 Ohm
Lead Channel Impedance Value: 3306 Ohm
Lead Channel Impedance Value: 3306 Ohm
Lead Channel Impedance Value: 342 Ohm
Lead Channel Impedance Value: 380 Ohm
Lead Channel Impedance Value: 475 Ohm
Lead Channel Impedance Value: 532 Ohm
Lead Channel Impedance Value: 874 Ohm
Lead Channel Pacing Threshold Amplitude: 0.875 V
Lead Channel Pacing Threshold Amplitude: 1.875 V
Lead Channel Pacing Threshold Pulse Width: 0.4 ms
Lead Channel Pacing Threshold Pulse Width: 0.8 ms
Lead Channel Sensing Intrinsic Amplitude: 1.375 mV
Lead Channel Sensing Intrinsic Amplitude: 1.375 mV
Lead Channel Sensing Intrinsic Amplitude: 12.625 mV
Lead Channel Sensing Intrinsic Amplitude: 12.625 mV
Lead Channel Setting Pacing Amplitude: 2.5 V
Lead Channel Setting Pacing Amplitude: 2.5 V
Lead Channel Setting Pacing Amplitude: 3 V
Lead Channel Setting Pacing Pulse Width: 0.4 ms
Lead Channel Setting Pacing Pulse Width: 0.8 ms
Lead Channel Setting Sensing Sensitivity: 2.8 mV
Zone Setting Status: 755011
Zone Setting Status: 755011

## 2024-05-04 ENCOUNTER — Other Ambulatory Visit: Payer: Self-pay | Admitting: Family Medicine

## 2024-05-04 DIAGNOSIS — E038 Other specified hypothyroidism: Secondary | ICD-10-CM

## 2024-05-07 DIAGNOSIS — K5792 Diverticulitis of intestine, part unspecified, without perforation or abscess without bleeding: Secondary | ICD-10-CM | POA: Diagnosis not present

## 2024-05-07 DIAGNOSIS — G4733 Obstructive sleep apnea (adult) (pediatric): Secondary | ICD-10-CM | POA: Diagnosis not present

## 2024-05-07 DIAGNOSIS — Z886 Allergy status to analgesic agent status: Secondary | ICD-10-CM | POA: Diagnosis not present

## 2024-05-07 DIAGNOSIS — K5732 Diverticulitis of large intestine without perforation or abscess without bleeding: Secondary | ICD-10-CM | POA: Diagnosis not present

## 2024-05-07 DIAGNOSIS — I447 Left bundle-branch block, unspecified: Secondary | ICD-10-CM | POA: Diagnosis not present

## 2024-05-07 DIAGNOSIS — Z7951 Long term (current) use of inhaled steroids: Secondary | ICD-10-CM | POA: Diagnosis not present

## 2024-05-07 DIAGNOSIS — I130 Hypertensive heart and chronic kidney disease with heart failure and stage 1 through stage 4 chronic kidney disease, or unspecified chronic kidney disease: Secondary | ICD-10-CM | POA: Diagnosis not present

## 2024-05-07 DIAGNOSIS — Z888 Allergy status to other drugs, medicaments and biological substances status: Secondary | ICD-10-CM | POA: Diagnosis not present

## 2024-05-07 DIAGNOSIS — Z7982 Long term (current) use of aspirin: Secondary | ICD-10-CM | POA: Diagnosis not present

## 2024-05-07 DIAGNOSIS — Z882 Allergy status to sulfonamides status: Secondary | ICD-10-CM | POA: Diagnosis not present

## 2024-05-07 DIAGNOSIS — J810 Acute pulmonary edema: Secondary | ICD-10-CM | POA: Diagnosis not present

## 2024-05-07 DIAGNOSIS — K5733 Diverticulitis of large intestine without perforation or abscess with bleeding: Secondary | ICD-10-CM | POA: Diagnosis not present

## 2024-05-07 DIAGNOSIS — Z7984 Long term (current) use of oral hypoglycemic drugs: Secondary | ICD-10-CM | POA: Diagnosis not present

## 2024-05-07 DIAGNOSIS — K922 Gastrointestinal hemorrhage, unspecified: Secondary | ICD-10-CM | POA: Diagnosis not present

## 2024-05-07 DIAGNOSIS — J9690 Respiratory failure, unspecified, unspecified whether with hypoxia or hypercapnia: Secondary | ICD-10-CM | POA: Diagnosis not present

## 2024-05-07 DIAGNOSIS — I5022 Chronic systolic (congestive) heart failure: Secondary | ICD-10-CM | POA: Diagnosis not present

## 2024-05-07 DIAGNOSIS — N189 Chronic kidney disease, unspecified: Secondary | ICD-10-CM | POA: Diagnosis not present

## 2024-05-07 DIAGNOSIS — Z885 Allergy status to narcotic agent status: Secondary | ICD-10-CM | POA: Diagnosis not present

## 2024-05-07 DIAGNOSIS — K449 Diaphragmatic hernia without obstruction or gangrene: Secondary | ICD-10-CM | POA: Diagnosis not present

## 2024-05-07 DIAGNOSIS — E1122 Type 2 diabetes mellitus with diabetic chronic kidney disease: Secondary | ICD-10-CM | POA: Diagnosis not present

## 2024-05-07 DIAGNOSIS — J449 Chronic obstructive pulmonary disease, unspecified: Secondary | ICD-10-CM | POA: Diagnosis not present

## 2024-05-07 DIAGNOSIS — I4891 Unspecified atrial fibrillation: Secondary | ICD-10-CM | POA: Diagnosis not present

## 2024-05-07 DIAGNOSIS — J4540 Moderate persistent asthma, uncomplicated: Secondary | ICD-10-CM | POA: Diagnosis not present

## 2024-05-07 DIAGNOSIS — Z881 Allergy status to other antibiotic agents status: Secondary | ICD-10-CM | POA: Diagnosis not present

## 2024-05-07 DIAGNOSIS — Z79899 Other long term (current) drug therapy: Secondary | ICD-10-CM | POA: Diagnosis not present

## 2024-05-07 DIAGNOSIS — Z880 Allergy status to penicillin: Secondary | ICD-10-CM | POA: Diagnosis not present

## 2024-05-07 DIAGNOSIS — K8020 Calculus of gallbladder without cholecystitis without obstruction: Secondary | ICD-10-CM | POA: Diagnosis not present

## 2024-05-09 ENCOUNTER — Ambulatory Visit: Payer: Self-pay | Admitting: Internal Medicine

## 2024-05-10 NOTE — Discharge Summary (Signed)
 [facility]  [facility] Inpatient Discharge Summary  PCP: Dorothyann JONETTA Byars, MD Discharge Details   Admit date:         05/07/2024 Discharge date:        05/10/2024  Hospital Days:    3 days  Code Status:   Full Code Advanced Directives on file: No Directive        Discharge Diagnoses:  Principal Problem:   Acute diverticulitis Active Problems:   Acute on chronic HFrEF (heart failure with reduced ejection fraction) (*)   DM type 2 (diabetes mellitus, type 2) (*)   Left bundle branch block   Asthma, moderate persistent (*)   OSA (obstructive sleep apnea)   Morbid obesity (*)   Respiratory failure (*)   COPD (chronic obstructive pulmonary disease) (*)   HTN (hypertension)   Type 2 diabetes mellitus without complication, without long-term current use of insulin  (*)   A-fib (*)   Presence of heart assist device (*)   Acute GI bleeding    Follow-Up Appointments Suggested: No follow-up provider specified. Follow-Up Appointments Already Scheduled: Future Appointments  Date Time Provider Department Center  07/25/2024 12:45 PM Merdis CHRISTELLA Kato, MD [facility]  04/26/2025  1:30 PM Rudell MALVA Bud, MD [facility]    Discharge Medications:   Current Discharge Medication List     START taking these medications      Details  ciprofloxacin  500 mg tablet Commonly known as: CIPRO   Take one tablet (500 mg dose) by mouth 2 (two) times daily for 4 days. Quantity: 8 tablet   furosemide  40 mg tablet Commonly known as: LASIX  Start taking on: May 11, 2024  Take one tablet (40 mg dose) by mouth daily for 30 days. Quantity: 30 tablet   HYDROcodone -acetaminophen  5-325 mg per tablet Commonly known as: NORCO  Take one tablet by mouth every 6 (six) hours as needed for Pain for up to 2 days. Max Daily Amount: 4 tablets Quantity: 8 tablet   metroNIDAZOLE  500 mg tablet Commonly known as: FLAGYL   Take one tablet (500 mg dose) by mouth 2 (two)  times daily for 4 days. Quantity: 8 tablet       CONTINUE these medications which have CHANGED      Details  carvedilol  3.125 mg tablet Commonly known as: COREG  What changed:  medication strength how much to take  Take one tablet (3.125 mg dose) by mouth 2 (two) times daily for 30 days. Quantity: 60 tablet   pantoprazole  sodium 40 mg tablet Commonly known as: PROTONIX  What changed:  how much to take how to take this when to take this  Take 30 mintues prior to meal Quantity: 90 tablet   spironolactone  12.5 mg tablet Commonly known as: ALDACTONE  Start taking on: May 11, 2024 What changed: medication strength  Take one tablet (12.5 mg dose) by mouth daily for 30 days. Quantity: 30 tablet   WOMENS MULTIVITAMIN PO What changed: Another medication with the same name was removed. Continue taking this medication, and follow the directions you see here.  Take 1 tablet by mouth every morning.       CONTINUE these medications which have NOT CHANGED      Details  ACCU-CHEK FASTCLIX LANCETS Misc  Test twice a day   ACCU-CHEK GUIDE w/Device Kit  CHECK BLOOD SUGAR TWO TIMES DAILY   acetaminophen  650 MG CR tablet Commonly known as: TYLENOL  ARTHRITIS,MAPAP  Take one tablet (650 mg dose) by mouth as needed for Pain.   *  albuterol  1.25 MG/3ML nebulizer solution Commonly known as: ACCUNEB   Take 3 mLs (1.25 mg dose) by nebulization every 6 (six) hours as needed for Wheezing.   * albuterol  sulfate HFA 108 (90 Base) MCG/ACT inhaler Commonly known as: PROVENTIL ,VENTOLIN ,PROAIR   Inhale two puffs into the lungs as needed. Up to four times a day   Albuterol -Budesonide 90-80 MCG/ACT Aero  Inhale two puffs into the lungs every 4 (four) hours as needed. Indication: Asthma   alendronate  70 mg tablet Commonly known as: FOSAMAX   Take one tablet (70 mg dose) by mouth every 7 (seven) days.   amiodarone  200 mg tablet Commonly known as: PACERONE  Start taking on: May 11, 2024  Take one tablet (200 mg dose) by mouth daily for 30 days. Quantity: 30 tablet   aspirin  81 mg chewable tablet  Chew one tablet (81 mg dose) by mouth every morning.   benzonatate  200 MG capsule Commonly known as: TESSALON   Take one capsule (200 mg dose) by mouth 2 (two) times a day as needed.   bisacodyl  5 mg EC tablet Commonly known as: BISACODYL ,DULCOLAX  Take one tablet (5 mg dose) by mouth daily as needed.   CALCIUM  600+D PLUS MINERALS 600-400 MG-UNIT Tabs  Take 2 tablets by mouth every evening.   docusate sodium  capsule Commonly known as: COLACE,DOK,DOCQLACE  Take one capsule (100 mg dose) by mouth 2 (two) times a day as needed for Constipation.   EPIPEN  2-PAK 0.3 MG/0.3ML injection Generic drug: EPINEPHrine   Inject 0.3 mLs (0.3 mg dose) into the muscle once as needed for Anaphylaxis.   erythromycin  ophthalmic ointment Commonly known as: ILOTYCIN   SMARTSIG:In Eye(s)   fexofenadine  180 mg tablet Commonly known as: ALLEGRA   Take one tablet (180 mg dose) by mouth every morning.   fluticasone -salmeterol 250-50 mcg/dose Aepb inhalation powder Commonly known as: ADVAIR  DISKUS/WIXELA  Inhale one puff into the lungs 2 (two) times daily.   GEMTESA 75 MG Tabs tablet Generic drug: vibegron  Take one tablet (75 mg dose) by mouth daily. Quantity: 90 tablet   glucose blood test strip  Test two times daily   isosorbide  mononitrate 30 mg 24 hr tablet Commonly known as: IMDUR   Take one tablet (30 mg dose) by mouth every morning.   levothyroxine  sodium 112 mcg tablet Commonly known as: SYNTHROID ,LEVOTHROID,LEVOXYL   every morning.   lubiprostone 24 mcg capsule Commonly known as: AMITIZA  Take one capsule (24 mcg dose) by mouth 2 (two) times daily with meals. Take with food to decrease nausea. Quantity: 90 capsule   meclizine  HCl 25 mg tablet Commonly known as: ANTIVERT   Take one tablet (25 mg dose) by mouth 3 (three) times a day as needed.   METAMUCIL 3 IN 1  DAILY FIBER 400 MG Caps capsule Generic drug: psyllium  Take one capsule (400 mg dose) by mouth 2 (two) times daily.   metFORMIN  ER 500 mg 24 hr tablet Commonly known as: GLUCOPHAGE -XR  Take one tablet (500 mg dose) by mouth 2 (two) times a day with meals.   nystatin  cream Commonly known as: MYCOSTATIN   Apply one Application topically 2 (two) times a day as needed. Quantity: 30 g   pravastatin  sodium 40 mg tablet Commonly known as: PRAVACHOL   Take one tablet (40 mg dose) by mouth every evening. Quantity: 30 tablet   PROBIOTIC DAILY PO  Take 2 tablets by mouth daily.   SODIUM FLUORIDE 5000 PLUS 1.1 % Crea dental cream Generic drug: SODIUM FLUORIDE (DENTAL GEL)  see administration instructions.  tiotropium bromide  monohydrate 18 mcg inhalation capsule Commonly known as: SPIRIVA   Place one capsule (18 mcg dose) into inhaler and inhale every morning. 2 puff daily   triamcinolone  acetonide 0.1% cream Commonly known as: KENalog   Apply one g (1 Application dose) topically 2 (two) times a day as needed (rash).   valacyclovir  1000 mg tablet Commonly known as: VALTREX   Take one tablet (1,000 mg dose) by mouth as needed.      * * This list has 2 medication(s) that are the same as other medications prescribed for you. Read the directions carefully, and ask your doctor or other care provider to review them with you.         * You might also be taking other medications not listed above. If you have questions about any of your other medications, talk to the person who prescribed them or your Primary Care Provider.          STOP taking these medications    mupirocin  2 % ointment Commonly known as: BACTROBAN    torsemide  20 mg tablet Commonly known as: DEMADEX         Allergies: Allergies[1]  Consultations this Admission: None  Procedures/Imaging:     CT Abdomen Pelvis W IV Contrast  Final Result  IMPRESSION:    1. Probable early uncomplicated diverticulitis  at the junction of the descending and sigmoid colon.  2. Morphology of the liver suggestive of cirrhosis.  3. Small hiatal hernia.  4. Cholelithiasis without inflammatory change.  5. Probable mild pulmonary edema with basilar atelectasis.  6. Cardiomegaly.    Electronically Signed by: Reyes Luna, MD on 05/07/2024 5:13 PM      Pertinent Labs:  Cardiac Labs: Recent Labs    Units 05/07/24 1406  BNP pg/mL 3,111*   CBC: Recent Labs    Units 05/10/24 0347 05/09/24 0159 05/08/24 0236 05/07/24 1319  WBC thou/mcL 5.7 9.7 12.2* 11.7*  HGB gm/dL 88.1 87.4 87.6 86.2  PLT thou/mcL 198 206 199 219   BMP: Recent Labs    Units 05/10/24 0347 05/09/24 0159 05/07/24 1406 05/07/24 1319  NA mmol/L 137 138  --  141  K mmol/L 4.4 4.5  --  4.6  CL mmol/L 103 102  --  104  CO2 mmol/L 26 26  --  23  BUN mg/dL 11 10  --  17  CREATININE mg/dL 9.28 9.21  --  9.08  MAGNESIUM  mg/dL 2.3 2.3 2.1  --    Lipid Panel: No results for input(s): CHOL, TRIG, HDL, LDL in the last 168 hours. Liver Enzymes: Recent Labs    Units 05/10/24 0347 05/09/24 0159 05/07/24 1319  INR   --   --  1.0  AST U/L 25 30 42*  ALT U/L 20 25 31   ALKPHOS U/L 128 132 138  BILITOT mg/dL 0.5 1.0 0.8   Endocrine Panels: Recent Labs    Units 05/10/24 0735 05/10/24 0347 05/09/24 1934 05/09/24 1649 05/09/24 1118 05/09/24 0811 05/09/24 0159 05/08/24 1933 05/08/24 1642 05/08/24 1112 05/08/24 0723 05/07/24 1949  GLUCOSE mg/dL 830* 894* 848* 78 883* 92 102* 108* 129* 96 105* 144[facility] Course   Physicians involved in care during this hospitalization Attending Provider: Darleene Norleen Railing, MD Attending Provider: Atlee Abernethy, MD Admitting Provider: Darleene Norleen Railing, MD    Hospital Course:     73 yo F treated for acute diverticulitis. She received IV aztreonam 2 g q8hr, IV flagyl  500 mg q12 and IV protonix  40 mg  bid. WBC improved from 12.2 -->5.7. CRP downtrended from 141 -->113. Diet was  advanced from clears to full liquid to GI soft slowly and carefully. The pt was able to tolerate each diet advancement well. Her abdominal pain improved and she was able to keep a solid diet down. She will be discharged home with 4 more days of PO ciprofloxacin  and PO flagyl . She was given a dose of PO cipro  and PO flagyl  prior to her discharge (as she has several allergies listed in EPIC). F/u with PCP in 1 - 2 weeks.   BP 137/72 (BP Location: Left Upper Arm, Patient Position: Lying)   Pulse 63   Temp 97.3 F (36.3 C) (Axillary)   Resp 16   Ht 1.549 m (5' 1)   Wt 87.1 kg (192 lb)   LMP  (LMP Unknown)   SpO2 97%   BMI 36.28 kg/m   Physical Exam HENT:     Head: Normocephalic.     Mouth/Throat:     Mouth: Mucous membranes are moist.  Cardiovascular:     Rate and Rhythm: Normal rate.  Pulmonary:     Effort: Pulmonary effort is normal.  Abdominal:     Palpations: Abdomen is soft.  Musculoskeletal:        General: Normal range of motion.  Skin:    General: Skin is warm.  Neurological:     Mental Status: She is alert. Mental status is at baseline.  Psychiatric:        Mood and Affect: Mood normal.     Post Hospital Care     Oxygen  Orders for Discharge: O2 Device: None (Room air) SpO2: 97 % A RR: 18 A RR: 18  Diet: Diet and Nourishment Orders (From admission, onward)     Start       05/09/24 1400  GI Soft/Low Fiber Diet  Diet effective 1400                      Lines/Drains/Airways: Patient Lines/Drains/Airways Status     Active LDAs     Name Placement date Placement time Site Days   Peripheral IV 20 G Distal;Left;Posterior Forearm 05/09/24  1156  Forearm  1   Continuous Glucose Monitor Right 05/07/24  1305  --  2              Home Health Orders: DME Orders (From admission, onward)    None      Home Health Agency     None       I spent 35 minutes performing discharge services.   Electronically signed: Zackery Sira, MD 05/10/2024 /  11:58 AM      [1] Allergies Allergen Reactions  . Celecoxib Itching  . Injectafer [Ferric Carboxymaltose] Shortness Of Breath    Mild SOB following injectafer infusion  . Nasonex  Bleeding  . Penicillins Anaphylaxis and Swelling  . Sulfa Antibiotics Rash and Itching  . Accupril [Quinapril Hcl] Other    Retains K+  . Dorethia Media ] Other    Pt states burns holes in stomach Pt states burns holes in stomach  . Atorvastatin  Other    Muscle aches  . Baking Soda-Fluoride [Sodium Fluoride] Rash    WORKING WITH CERAMICS AT THE TIME OF REACTION  . Basle Rash  . Cetirizine Rash  . Clindamycin /Lincomycin Rash  . Codeine Nausea And Vomiting  . Dml Forte Rash  . Flonase  [Fluticasone ] Other    NASAL BLEEDING.  . Lactase Abdominal Pain and Other  Increased calcium , sneezing, nasal issues  . Lactose Other    Other reaction(s): Other Increased calcium , sneezing, nasal issues  . Lactose Intolerance (Gi) Other    Increased calcium , sneezing, nasal issues  . Levocetirizine Rash  . Levomenol Other    unknown  . Lincomycin Rash  . Losartan  Other  . Losartan  Potassium Unknown    INTERACTED WITH OTHER MEDICATION.  CANNOT REMEMBER REACTION  . Miralax  [Polyethylene Glycol] Rash  . Mometasone  Furoate Other    nosebleed  . Montelukast  Other    dizziness  . Naproxen  Swelling  . Nasonex  [Mometasone ] Rash    AND NOSE BLEED  . Oxycodone  Other  . Petrolatum & Lanolin [Aquaphor] Rash  . Petrolatum-Zinc Oxide Rash  . Quinapril Other  . Quinine Rash  . Quinine Derivatives Rash  . Rifampin Unknown    DOES NOT REMEMBER THIS MEDICATION OR ALLERGY  . Rifamycin Other    unknown  . Sensi-Care Protective Barrier Rash  . Sulfamethoxazole Rash  . Wellbutrin  [Bupropion ] Other    Muscle aches, pain, and made my skin crawl  . Wool Alcohol [Lanolin] Rash  . Zoledronic  Acid Rash and Other    shaking  . Dicyclomine  Confusion/Altered Mental Status  . Metformin  And Related Diarrhea    Low  dose ok  . Tramadol  Anxiety  *Some images could not be shown.

## 2024-05-11 ENCOUNTER — Other Ambulatory Visit: Payer: Self-pay | Admitting: Family Medicine

## 2024-05-11 ENCOUNTER — Telehealth: Payer: Self-pay

## 2024-05-11 NOTE — Patient Instructions (Signed)
 Visit Information  Thank you for taking time to visit with me today. Please don't hesitate to contact me if I can be of assistance to you before our next scheduled telephone appointment.  Our next appointment is by telephone on 05/12/24 at 10:50am  Following is a copy of your care plan:   Goals Addressed             This Visit's Progress    VBCI Transitions of Care (TOC) Care Plan       Problems:  Recent Hospitalization for treatment of Acute diverticulitis - colitis Medication management barrier Patient needed slow and methodical medication review (call lasted 2 hours) patient stated at the end she found the review to be helpful - TOC RN encouraged her to make notes on her discharge medication list and to take the list to her 05/18/24 hospital follow up appointment - communication also with PCP, Dr Alvan to see if Tanya Hogan feels Home Health SN referral might help  Goal:  Over the next 30 days, the patient will not experience hospital readmission  Interventions:  Transitions of Care: Doctor Visits  - discussed the importance of doctor visits Contacted provider for patient needs 05/18/24 - was a routine 3 month follow up - changed to hospital follow up appointment  The following message was sent in [facility] secure chat to PCP, Dr Alvan as follows: Patient discharged 05/10/24 from [facility] with diverticulitis - Call lasted 2 hours due to need to slowly and methodically review of medications - Patient feels she would benefit for Home Health SN seeing her in home to review. Patient voiced concern that her Torsemide  was stopped and she was changed to Furosemide  and states she does not like this med because it makes her run to the bathroom so much and states it lowers her K+ (review of labs K+ was 4.4 1 day ago - Phos was 2.2 low 1 day ago - CRP 113.20 high 1 day ago- just [place]), Discharge shows Albuterol  Neb 1.25mg /53mL and patient has 2.5mg , Discharges shows both Albuterol  Suffete (patient  stets she gets from [place] and needs but discharge also shows Albuterol  - Budesonide - is this not one or the other? she has neither. Patient has multiple meds listed that are for constipation but reports she has watery diarrhea - does not have any of the Amitiza. Nor does she have meclizine  or benzonate listed on discharge but does not feel she needs either - [facility] advised me that the following meds were called in for refill but has been delayed due to insurance including Spironolactone , Amiodarone , Carvediolol, Kenalog  cream  I called [facility] 3 times and held so long that call ultimately disconnected - the last attempt I spoke with "a Production designer, theatre/television/film" who said he'd connect me straight to the pharmacist and I held more than 15 minutes with no answer - Patient thanked me at the end and said our very methodical med review was helpful. I hope so. I plan to follow up with her tomorrow. Patient is currently holding meds for constipation.  From Dr Alvan Aye so sorry, Thank you for talking with her. Thank you so much for spending that time with her. I think it also would probably be helpful for her to bring her medications in when she comes in for her hospital follow-up it sounds like there might be a lot of confusion around what she should be taking and what changes she has it may be what she is missing. I would recommend that  she hold the Amitiza and not start it if she is having diarrhea. I am wondering if they switch the diuretic because she was not pulling enough volume and so they may have switched her to something they felt was actually going to improve her diuresis even though it sounds like it is making her run to the bathroom. We can definitely try to get her in for a follow-up next week if she does not have 1 scheduled. Will definitely need to recheck her potassium 2. I would say yes if she has the combo albuterol  but desonide she can use that in place of just the plain albuterol .  TOC RN: Patient currently  holding meds for constipation - Please tell me what else I can do to help - She has neither rescue inhaler - I had your office change her 3 month routine visit scheduled 9/25 to hospital follow up  Tanya JONETTA Byars, Tanya Hogan: I did recommend that she just stay on the furosemide  until we see her back and repeat labs then and then at that point we can determine if it is okay to switch her back to torsemide . She does have a working nebulizer?  TOC RN: On d/c she also has bisacodyl , colace, Meamucil  Tanya JONETTA Byars, Tanya Hogan: It looks like she also has Wixela on file so if she has that she just needs to take a puff twice a day on that, every day. And then we can just get her some plain albuterol  vials for her nebulizer.  TOC RN: ok. I'm trying to review all of my notes - I asked her to make notes of the things we talked about on her discharge medication list and to bring it to her appointment. Should she hold those other 3 meds until watery diarrhea resolves?  She said she does not have the Lasix  because [facility] has a note that she can't tolerate it but she said to me that is because she runs to bathroom - not an allergy  I'm trying to remember if she said she had any issue with the nebulizer itself - she did chack and said her medication for nebulizer was 2.5 instead of 1.25 as is on d/c   Do you feel home health nurse for in home medication review would help? If so, would you order?  Patient Self Care Activities:  Attend all scheduled provider appointments Call pharmacy for medication refills 3-7 days in advance of running out of medications Call provider office for new concerns or questions  Notify RN Care Manager of TOC call rescheduling needs Participate in Transition of Care Program/Attend TOC scheduled calls Perform IADL's (shopping, preparing meals, housekeeping, managing finances) independently Take medications as prescribed    Plan:  Telephone follow up appointment with care  management team member scheduled for:  05/12/24 The patient has been provided with contact information for the care management team and has been advised to call with any health related questions or concerns.         Patient verbalizes understanding of instructions and care plan provided today and agrees to view in MyChart. Active MyChart status and patient understanding of how to access instructions and care plan via MyChart confirmed with patient.     Telephone follow up appointment with care management team member scheduled for: 05/12/24 The patient has been provided with contact information for the care management team and has been advised to call with any health related questions or concerns.   Please call the care guide  team at [phone] if you need to cancel or reschedule your appointment.   Please call the Suicide and Crisis Lifeline: 988 call 1-800-273-TALK (toll free, 24 hour hotline) call 911 if you are experiencing a Mental Health or Behavioral Health Crisis or need someone to talk to.  Shona Prow RN, CCM [place]  VBCI-Population Health RN Care Manager [phone]

## 2024-05-11 NOTE — Progress Notes (Signed)
 CARE COORDINATOR CONTACT WITH PATIENT/CAREGIVER AFTER HOSPITAL DISCHARGE   Patient contacted.  Encouraged to schedule Hospital follow up appointment with their Non NH PCP. Patient has a PCP appointment on 05/18/2024 @ 10:30 am.

## 2024-05-11 NOTE — Transitions of Care (Post Inpatient/ED Visit) (Signed)
 05/11/2024  Name: Tanya Hogan MRN: [id number] DOB: [birth year]  Today's TOC FU Call Status: Today's TOC FU Call Status:: Successful TOC FU Call Completed TOC FU Call Complete Date: 05/11/24 Patient's Name and Date of Birth confirmed.  Transition Care Management Follow-up Telephone Call Date of Discharge: 05/10/24 Discharge Facility: Other [facility]) Name of Other (Non[facility]) Discharge Facility: [facility] Type of Discharge: Inpatient Admission Primary Inpatient Discharge Diagnosis:: Acute diverticulitis How have you been since you were released from the hospital?: Better Any questions or concerns?: No  Items Reviewed: Did you receive and understand the discharge instructions provided?: Yes Medications obtained,verified, and reconciled?: Yes (Medications Reviewed) Any new allergies since your discharge?: No Dietary orders reviewed?: Yes Type of Diet Ordered:: Avoid seeds and nuts Tanya Hogan you have support at home?: Yes People in Home [RPT]: child(ren), adult Name of Support/Comfort Primary Source: Patient reports she has children who help but they Tanya Hogan work - 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 Sig Documenting Provider Last Dose Status Informant  acetaminophen  (TYLENOL ) 325 MG tablet [id number] Yes Take 325 mg by mouth every 6 (six) hours as needed for mild pain (pain score 1-3). [provider]  Active   albuterol  (PROVENTIL ) (2.5 MG/3ML) 0.083% nebulizer solution [id number] Yes Take 2.5 mg by nebulization every 6 (six) hours as needed for wheezing or shortness of breath. [provider]  Active Self, Pharmacy Records  albuterol  (VENTOLIN  HFA) [phone] Base) MCG/ACT inhaler [id number]  Inhale 1-2 puffs into the lungs every 6 (six) hours as needed  for wheezing or shortness of breath.  Patient not taking: Reported on 05/11/2024   Tanya Dorothyann BIRCH, MD  Active Self, Pharmacy Records  alendronate  (FOSAMAX ) 70 MG tablet [id number] Yes TAKE 1 TABLET BY MOUTH EVERY 7  DAYS WITH FULL GLASS OF WATER ON AN EMPTY STOMACH Tanya Dorothyann BIRCH, MD  Active   Tanya Hogan MEDICATION [id number]  Medication Name: Tubing and supplies for nebulizer. Fax - [phone] Tanya Dorothyann BIRCH, MD  Active Self, Pharmacy Records  amiodarone  (PACERONE ) 200 MG tablet [id number] Yes Take 1 tablet (200 mg total) by mouth daily. Tanya Redell GORMAN, MD  Active   aspirin  81 MG chewable tablet [id number] Yes Chew 81 mg by mouth daily. [provider]  Active Self, Pharmacy Records  Benzoyl Peroxide  (PANOXYL FOAMING WASH) 10 % LIQD [id number]  Wash groin creases daily with this wash  Ok to sub something similar  Patient not taking: Reported on 05/11/2024   Tanya Dorothyann BIRCH, MD  Active            Med Note JACKOLYN, Tanya Hogan   Thu Dec 09, 2023 10:26 AM)    Calcium  Carbonate-Vit D-Min (CALCIUM  600+D PLUS MINERALS) 600-400 MG-UNIT TABS [id number] Yes 1 tab p.o. twice daily Tanya Debby PARAS, MD  Active   carvedilol  (COREG ) 3.125 MG tablet [id number] Yes Take 1 tablet (3.125 mg total) by mouth 2 (two) times daily. Tanya Redell GORMAN, MD  Active   ciprofloxacin  (CIPRO ) 500 MG/5ML (10%) suspension [id number] Yes Take 500 mg by mouth 2 (two) times daily. [provider]  Active   Continuous Glucose Receiver (FREESTYLE LIBRE 3 READER) DEVI [id number] Yes Use for continuous glucose monitoring Tanya Dorothyann BIRCH, MD  Active   Continuous Glucose Sensor (FREESTYLE  LIBRE 3 SENSOR) MISC [id number] Yes by Does not apply route. Change every 14 days [provider]  Active            Med Note JACKOLYN, Tanya Hogan   Thu Dec 09, 2023 10:26 AM)    EPINEPHrine  0.3 mg/0.3 mL IJ SOAJ injection [id number] Yes Inject 0.3 mg into the muscle as needed for  anaphylaxis. Tanya Dorothyann BIRCH, MD  Active   estradiol (ESTRACE) 0.1 MG/GM vaginal cream [id number]  Place 1 Applicatorful vaginally 3 (three) times a week.  Patient not taking: Reported on 05/11/2024   [provider]  Active   fexofenadine  (ALLEGRA ) 180 MG tablet [id number] Yes Take 180 mg by mouth daily. Per patient break tablet in half taking 1/2 tab twice a day [provider]  Active   fluticasone -salmeterol (WIXELA INHUB ) 250-50 MCG/ACT AEPB [id number] Yes Inhale 1 puff into the lungs in the morning and at bedtime. [provider]  Active   GEMTESA 75 MG TABS [id number] Yes Take 75 mg by mouth daily. [provider]  Active   glucose blood test strip [id number] Yes To be used to check blood glucose Dx:E11.8 FreeStyle Precision blood glucose for Freestyle Libre 2 Tanya Dorothyann BIRCH, MD  Active   isosorbide  mononitrate (IMDUR ) 30 MG 24 hr tablet [id number] Yes Take 1 tablet (30 mg total) by mouth daily. Tanya Redell RAMAN, MD  Active   levothyroxine  (SYNTHROID ) 112 MCG tablet [id number] Yes TAKE 1 TABLET BY MOUTH DAILY Tanya Dorothyann BIRCH, MD  Active   lubiprostone (AMITIZA) 24 MCG capsule [id number]  Take 24 mcg by mouth 2 (two) times daily with a meal. Patient reports provider has decreased to every three days.  Patient not taking: Reported on 05/11/2024   [provider]  Active Self  metFORMIN  (GLUCOPHAGE -XR) 500 MG 24 hr tablet [id number] Yes TAKE 1 TABLET BY MOUTH TWICE  DAILY WITH MEALS Tanya Dorothyann BIRCH, MD  Active   metroNIDAZOLE  (FLAGYL ) 500 MG tablet [id number] Yes Take 500 mg by mouth 2 (two) times daily. [provider]  Active   Multiple Vitamins-Minerals (MULTIPLE VITAMINS/WOMENS PO) [id number] Yes Take 1 tablet by mouth daily.  Patient taking differently: Take 1 tablet by mouth daily. Patient takes MVI with iron   [provider]  Active   Multiple Vitamins-Minerals (OCUVITE EYE + MULTI) TABS [id number] Yes Take 1  tablet by mouth in the morning. [provider]  Active Self, Pharmacy Records  pantoprazole  (PROTONIX ) 40 MG tablet [id number] Yes TAKE 1 TABLET BY MOUTH DAILY  BEFORE BREAKFAST Tanya Dorothyann BIRCH, MD  Active Self, Pharmacy Records           Med Note Tanya Hogan, Tanya Hogan   Thu Dec 09, 2023 10:26 AM)    pravastatin  (PRAVACHOL ) 40 MG tablet [id number] Yes Take 40 mg by mouth daily. [provider]  Active   spironolactone  (ALDACTONE ) 25 MG tablet [id number] Yes Take 0.5 tablets (12.5 mg total) by mouth daily. Tanya Redell RAMAN, MD  Active   Tiotropium Bromide  Monohydrate (SPIRIVA  RESPIMAT) 1.25 MCG/ACT AERS [id number] Yes Inhale 1 puff into the lungs daily. [provider]  Active Self, Pharmacy Records  torsemide  (DEMADEX ) 20 MG tablet [id number]  Take 1 tablet (20 mg total) by mouth daily.  Patient not taking: Reported on 05/11/2024   Tanya Redell RAMAN, MD  Active   valACYclovir  (VALTREX ) 1000 MG tablet [id number] Yes Take 1 tablet (1,000 mg total) by mouth 2 (two) times daily. Tanya Bring, Tanya Hogan  Active  Home Care and Equipment/Supplies: Were Home Health Services Ordered?: NA (Not ordered. Patient feels it's needed for SN - will notify  Dr Geronimo) Any new equipment or medical supplies ordered?: No  Functional Questionnaire: Tanya Hogan you need assistance with bathing/showering or dressing?: No (can but it takes a long time on a wait list with [facility] for help) Tanya Hogan you need assistance with meal preparation?: No (Gets MOW - can prepare if needed) Tanya Hogan you need assistance with eating?: No Tanya Hogan you have difficulty maintaining continence: Yes (patient reporting watery diarrhea with some incontinence) Tanya Hogan you need assistance with getting out of bed/getting out of a chair/moving?: No Tanya Hogan you have difficulty managing or taking your medications?: No (patient states she manages her own medications but states this has been helfpul - reviewing meds with  RN)  Follow up appointments reviewed: PCP Follow-up appointment confirmed?: Yes Date of PCP follow-up appointment?: 05/18/24 Follow-up Provider: [name], MD Specialist Hospital Follow-up appointment confirmed?: NA Tanya Hogan you need transportation to your follow-up appointment?: No Tanya Hogan you understand care options if your condition(s) worsen?: Yes-patient verbalized understanding  SDOH Interventions Today    Flowsheet Row Most Recent Value  SDOH Interventions   Food Insecurity Interventions Intervention Not Indicated  Housing Interventions Intervention Not Indicated  Transportation Interventions Intervention Not Indicated  Utilities Interventions Intervention Not Indicated    Goals Addressed             This Visit's Progress    VBCI Transitions of Care (TOC) Care Plan       Problems:  Recent Hospitalization for treatment of Acute diverticulitis - colitis Medication management barrier Patient needed slow and methodical medication review (call lasted 2 hours) patient stated at the end she found the review to be helpful - TOC RN encouraged her to make notes on her discharge medication list and to take the list to her 05/18/24 hospital follow up appointment - communication also with PCP, Dr Tanya to see if MD feels Home Health SN referral might help  Goal:  Over the next 30 days, the patient will not experience hospital readmission  Interventions:  Transitions of Care: Doctor Visits  - discussed the importance of doctor visits Contacted provider for patient needs 05/18/24 - was a routine 3 month follow up - changed to hospital follow up appointment  The following message was sent in [facility] secure chat to PCP, Dr Tanya as follows: Patient discharged 05/10/24 from [facility] with diverticulitis - Call lasted 2 hours due to need to slowly and methodically review of medications - Patient feels she would benefit for Home Health SN seeing her in home to review. Patient  voiced concern that her Torsemide  was stopped and she was changed to Furosemide  and states she does not like this med because it makes her run to the bathroom so much and states it lowers her K+ (review of labs K+ was 4.4 1 day ago - Phos was 2.2 low 1 day ago - CRP 113.20 high 1 day ago- just [place]), Discharge shows Albuterol  Neb 1.25mg /68mL and patient has 2.5mg , Discharges shows both Albuterol  Suffete (patient stets she gets from [place] and needs but discharge also shows Albuterol  - Budesonide - is this not one or the other? she has neither. Patient has multiple meds listed that are for constipation but reports she has watery diarrhea - does not have any of the Amitiza. Nor does she have meclizine  or benzonate listed on discharge but does not feel she needs either - [facility] advised  me that the following meds were called in for refill but has been delayed due to insurance including Spironolactone , Amiodarone , Carvediolol, Kenalog  cream  I called [facility] 3 times and held so long that call ultimately disconnected - the last attempt I spoke with "a manager" who said he'd connect me straight to the pharmacist and I held more than 15 minutes with no answer - Patient thanked me at the end and said our very methodical med review was helpful. I hope so. I plan to follow up with her tomorrow. Patient is currently holding meds for constipation.  From Dr Tanya Aye so sorry, Thank you for talking with her. Thank you so much for spending that time with her. I think it also would probably be helpful for her to Hogan her medications in when she comes in for her hospital follow-up it sounds like there might be a lot of confusion around what she should be taking and what changes she has it may be what she is missing. I would recommend that she hold the Amitiza and not start it if she is having diarrhea. I am wondering if they switch the diuretic because she was not pulling enough volume and so they may have switched her to  something they felt was actually going to improve her diuresis even though it sounds like it is making her run to the bathroom. We can definitely try to get her in for a follow-up next week if she does not have 1 scheduled. Will definitely need to recheck her potassium 2. I would say yes if she has the combo albuterol  but desonide she can use that in place of just the plain albuterol .  TOC RN: Patient currently holding meds for constipation - Please tell me what else I can Tanya Hogan to help - She has neither rescue inhaler - I had your office change her 3 month routine visit scheduled 9/25 to hospital follow up  Dorothyann JONETTA Alvan, MD: I did recommend that she just stay on the furosemide  until we see her back and repeat labs then and then at that point we can determine if it is okay to switch her back to torsemide . She does have a working nebulizer?  TOC RN: On d/c she also has bisacodyl , colace, Meamucil  Dorothyann JONETTA Alvan, MD: It looks like she also has Wixela on file so if she has that she just needs to take a puff twice a day on that, every day. And then we can just get her some plain albuterol  vials for her nebulizer.  TOC RN: ok. I'm trying to review all of my notes - I asked her to make notes of the things we talked about on her discharge medication list and to Hogan it to her appointment. Should she hold those other 3 meds until watery diarrhea resolves?  She said she does not have the Lasix  because [facility] has a note that she can't tolerate it but she said to me that is because she runs to bathroom - not an allergy  I'm trying to remember if she said she had any issue with the nebulizer itself - she did chack and said her medication for nebulizer was 2.5 instead of 1.25 as is on d/c   Tanya Hogan you feel home health nurse for in home medication review would help? If so, would you order?  Patient Self Care Activities:  Attend all scheduled provider appointments Call pharmacy for medication  refills 3-7 days in advance of running out of  medications Call provider office for new concerns or questions  Notify RN Care Manager of TOC call rescheduling needs Participate in Transition of Care Program/Attend TOC scheduled calls Perform IADL's (shopping, preparing meals, housekeeping, managing finances) independently Take medications as prescribed    Plan:  Telephone follow up appointment with care management team member scheduled for:  05/12/24 The patient has been provided with contact information for the care management team and has been advised to call with any health related questions or concerns.         Shona Prow RN, CCM [place]  VBCI-Population Health RN Care Manager [phone]

## 2024-05-12 ENCOUNTER — Emergency Department (HOSPITAL_BASED_OUTPATIENT_CLINIC_OR_DEPARTMENT_OTHER)

## 2024-05-12 ENCOUNTER — Telehealth: Payer: Self-pay

## 2024-05-12 ENCOUNTER — Encounter (HOSPITAL_BASED_OUTPATIENT_CLINIC_OR_DEPARTMENT_OTHER): Payer: Self-pay | Admitting: Emergency Medicine

## 2024-05-12 ENCOUNTER — Inpatient Hospital Stay (HOSPITAL_BASED_OUTPATIENT_CLINIC_OR_DEPARTMENT_OTHER)
Admission: EM | Admit: 2024-05-12 | Discharge: 2024-05-24 | DRG: 287 | Disposition: A | Discharge status: Home Health | Attending: Cardiology | Admitting: Cardiology

## 2024-05-12 ENCOUNTER — Other Ambulatory Visit: Payer: Self-pay

## 2024-05-12 DIAGNOSIS — I498 Other specified cardiac arrhythmias: Secondary | ICD-10-CM | POA: Diagnosis present

## 2024-05-12 DIAGNOSIS — Z471 Aftercare following joint replacement surgery: Secondary | ICD-10-CM | POA: Diagnosis not present

## 2024-05-12 DIAGNOSIS — Z87891 Personal history of nicotine dependence: Secondary | ICD-10-CM | POA: Diagnosis not present

## 2024-05-12 DIAGNOSIS — Z7982 Long term (current) use of aspirin: Secondary | ICD-10-CM

## 2024-05-12 DIAGNOSIS — I272 Pulmonary hypertension, unspecified: Secondary | ICD-10-CM | POA: Diagnosis present

## 2024-05-12 DIAGNOSIS — Z950 Presence of cardiac pacemaker: Secondary | ICD-10-CM | POA: Diagnosis not present

## 2024-05-12 DIAGNOSIS — K5732 Diverticulitis of large intestine without perforation or abscess without bleeding: Secondary | ICD-10-CM | POA: Diagnosis not present

## 2024-05-12 DIAGNOSIS — Z882 Allergy status to sulfonamides status: Secondary | ICD-10-CM

## 2024-05-12 DIAGNOSIS — I493 Ventricular premature depolarization: Secondary | ICD-10-CM | POA: Diagnosis present

## 2024-05-12 DIAGNOSIS — Z820 Family history of epilepsy and other diseases of the nervous system: Secondary | ICD-10-CM

## 2024-05-12 DIAGNOSIS — Z79890 Hormone replacement therapy (postmenopausal): Secondary | ICD-10-CM

## 2024-05-12 DIAGNOSIS — F419 Anxiety disorder, unspecified: Secondary | ICD-10-CM | POA: Diagnosis present

## 2024-05-12 DIAGNOSIS — I471 Supraventricular tachycardia, unspecified: Secondary | ICD-10-CM | POA: Diagnosis present

## 2024-05-12 DIAGNOSIS — Z808 Family history of malignant neoplasm of other organs or systems: Secondary | ICD-10-CM

## 2024-05-12 DIAGNOSIS — I5022 Chronic systolic (congestive) heart failure: Secondary | ICD-10-CM | POA: Diagnosis present

## 2024-05-12 DIAGNOSIS — M25511 Pain in right shoulder: Secondary | ICD-10-CM | POA: Diagnosis present

## 2024-05-12 DIAGNOSIS — R0602 Shortness of breath: Secondary | ICD-10-CM | POA: Diagnosis not present

## 2024-05-12 DIAGNOSIS — Z888 Allergy status to other drugs, medicaments and biological substances status: Secondary | ICD-10-CM

## 2024-05-12 DIAGNOSIS — E785 Hyperlipidemia, unspecified: Secondary | ICD-10-CM | POA: Diagnosis not present

## 2024-05-12 DIAGNOSIS — I472 Ventricular tachycardia, unspecified: Principal | ICD-10-CM | POA: Diagnosis present

## 2024-05-12 DIAGNOSIS — I340 Nonrheumatic mitral (valve) insufficiency: Secondary | ICD-10-CM | POA: Diagnosis present

## 2024-05-12 DIAGNOSIS — Z79899 Other long term (current) drug therapy: Secondary | ICD-10-CM

## 2024-05-12 DIAGNOSIS — Z1152 Encounter for screening for COVID-19: Secondary | ICD-10-CM

## 2024-05-12 DIAGNOSIS — Z880 Allergy status to penicillin: Secondary | ICD-10-CM

## 2024-05-12 DIAGNOSIS — I428 Other cardiomyopathies: Secondary | ICD-10-CM | POA: Diagnosis not present

## 2024-05-12 DIAGNOSIS — Z23 Encounter for immunization: Secondary | ICD-10-CM

## 2024-05-12 DIAGNOSIS — Z87442 Personal history of urinary calculi: Secondary | ICD-10-CM

## 2024-05-12 DIAGNOSIS — K219 Gastro-esophageal reflux disease without esophagitis: Secondary | ICD-10-CM | POA: Diagnosis not present

## 2024-05-12 DIAGNOSIS — I110 Hypertensive heart disease with heart failure: Secondary | ICD-10-CM | POA: Diagnosis not present

## 2024-05-12 DIAGNOSIS — Z96611 Presence of right artificial shoulder joint: Secondary | ICD-10-CM | POA: Diagnosis present

## 2024-05-12 DIAGNOSIS — I071 Rheumatic tricuspid insufficiency: Secondary | ICD-10-CM | POA: Diagnosis not present

## 2024-05-12 DIAGNOSIS — G4733 Obstructive sleep apnea (adult) (pediatric): Secondary | ICD-10-CM | POA: Diagnosis not present

## 2024-05-12 DIAGNOSIS — Z803 Family history of malignant neoplasm of breast: Secondary | ICD-10-CM

## 2024-05-12 DIAGNOSIS — Z9011 Acquired absence of right breast and nipple: Secondary | ICD-10-CM

## 2024-05-12 DIAGNOSIS — I959 Hypotension, unspecified: Secondary | ICD-10-CM | POA: Diagnosis present

## 2024-05-12 DIAGNOSIS — E875 Hyperkalemia: Secondary | ICD-10-CM | POA: Diagnosis not present

## 2024-05-12 DIAGNOSIS — E1151 Type 2 diabetes mellitus with diabetic peripheral angiopathy without gangrene: Secondary | ICD-10-CM | POA: Diagnosis present

## 2024-05-12 DIAGNOSIS — Z91048 Other nonmedicinal substance allergy status: Secondary | ICD-10-CM

## 2024-05-12 DIAGNOSIS — I4729 Other ventricular tachycardia: Secondary | ICD-10-CM | POA: Diagnosis not present

## 2024-05-12 DIAGNOSIS — E039 Hypothyroidism, unspecified: Secondary | ICD-10-CM | POA: Diagnosis present

## 2024-05-12 DIAGNOSIS — Z886 Allergy status to analgesic agent status: Secondary | ICD-10-CM

## 2024-05-12 DIAGNOSIS — M797 Fibromyalgia: Secondary | ICD-10-CM | POA: Diagnosis present

## 2024-05-12 DIAGNOSIS — K5900 Constipation, unspecified: Secondary | ICD-10-CM | POA: Diagnosis present

## 2024-05-12 DIAGNOSIS — Z853 Personal history of malignant neoplasm of breast: Secondary | ICD-10-CM

## 2024-05-12 DIAGNOSIS — Z7983 Long term (current) use of bisphosphonates: Secondary | ICD-10-CM

## 2024-05-12 DIAGNOSIS — I214 Non-ST elevation (NSTEMI) myocardial infarction: Secondary | ICD-10-CM | POA: Diagnosis present

## 2024-05-12 DIAGNOSIS — Z8249 Family history of ischemic heart disease and other diseases of the circulatory system: Secondary | ICD-10-CM

## 2024-05-12 DIAGNOSIS — M2550 Pain in unspecified joint: Secondary | ICD-10-CM | POA: Diagnosis not present

## 2024-05-12 DIAGNOSIS — Z885 Allergy status to narcotic agent status: Secondary | ICD-10-CM

## 2024-05-12 DIAGNOSIS — Z91011 Allergy to milk products, unspecified: Secondary | ICD-10-CM

## 2024-05-12 LAB — HEPATIC FUNCTION PANEL
ALT: 32 U/L (ref 0–44)
AST: 48 U/L — ABNORMAL HIGH (ref 15–41)
Albumin: 4.2 g/dL (ref 3.5–5.0)
Alkaline Phosphatase: 165 U/L — ABNORMAL HIGH (ref 38–126)
Bilirubin, Direct: 0.2 mg/dL (ref 0.0–0.2)
Indirect Bilirubin: 0.2 mg/dL — ABNORMAL LOW (ref 0.3–0.9)
Total Bilirubin: 0.4 mg/dL (ref 0.0–1.2)
Total Protein: 7.1 g/dL (ref 6.5–8.1)

## 2024-05-12 LAB — BASIC METABOLIC PANEL WITH GFR
Anion gap: 17 — ABNORMAL HIGH (ref 5–15)
BUN: 17 mg/dL (ref 8–23)
CO2: 19 mmol/L — ABNORMAL LOW (ref 22–32)
Calcium: 11 mg/dL — ABNORMAL HIGH (ref 8.9–10.3)
Chloride: 104 mmol/L (ref 98–111)
Creatinine, Ser: 1.05 mg/dL — ABNORMAL HIGH (ref 0.44–1.00)
GFR, Estimated: 55 mL/min — ABNORMAL LOW (ref >60)
Glucose, Bld: 190 mg/dL — ABNORMAL HIGH (ref 70–99)
Potassium: 4.3 mmol/L (ref 3.5–5.1)
Sodium: 140 mmol/L (ref 135–145)

## 2024-05-12 LAB — CBC
HCT: 40 % (ref 36.0–46.0)
Hemoglobin: 13.4 g/dL (ref 12.0–15.0)
MCH: 32.4 pg (ref 26.0–34.0)
MCHC: 33.5 g/dL (ref 30.0–36.0)
MCV: 96.6 fL (ref 80.0–100.0)
Platelets: 316 K/uL (ref 150–400)
RBC: 4.14 MIL/uL (ref 3.87–5.11)
RDW: 13.2 % (ref 11.5–15.5)
WBC: 10.6 K/uL — ABNORMAL HIGH (ref 4.0–10.5)
nRBC: 0 % (ref 0.0–0.2)

## 2024-05-12 LAB — RESP PANEL BY RT-PCR (RSV, FLU A&B, COVID)  RVPGX2
Influenza A by PCR: NEGATIVE
Influenza B by PCR: NEGATIVE
Resp Syncytial Virus by PCR: NEGATIVE
SARS Coronavirus 2 by RT PCR: NEGATIVE

## 2024-05-12 LAB — TROPONIN T, HIGH SENSITIVITY: Troponin T High Sensitivity: 44 ng/L — ABNORMAL HIGH (ref 0–19)

## 2024-05-12 LAB — LACTIC ACID, PLASMA: Lactic Acid, Venous: 2.9 mmol/L (ref 0.5–1.9)

## 2024-05-12 LAB — MAGNESIUM: Magnesium: 2.3 mg/dL (ref 1.7–2.4)

## 2024-05-12 LAB — LIPASE, BLOOD: Lipase: 19 U/L (ref 11–51)

## 2024-05-12 MED ORDER — AMIODARONE LOAD VIA INFUSION
150.0000 mg | Freq: Once | INTRAVENOUS | Status: AC
  Administered 2024-05-12: 150 mg via INTRAVENOUS
  Filled 2024-05-12: qty 83.34

## 2024-05-12 MED ORDER — ETOMIDATE 2 MG/ML IV SOLN
INTRAVENOUS | Status: AC
  Administered 2024-05-12: 5 mg via INTRAVENOUS
  Filled 2024-05-12: qty 10

## 2024-05-12 MED ORDER — ETOMIDATE 2 MG/ML IV SOLN: 8.0000 mg | Freq: Once | INTRAVENOUS | Status: AC

## 2024-05-12 MED ORDER — AMIODARONE HCL IN DEXTROSE 360-4.14 MG/200ML-% IV SOLN
30.0000 mg/h | INTRAVENOUS | Status: DC
  Administered 2024-05-13 – 2024-05-14 (×3): 30 mg/h via INTRAVENOUS

## 2024-05-12 MED ORDER — AMIODARONE IV BOLUS ONLY 150 MG/100ML
150.0000 mg | Freq: Once | INTRAVENOUS | Status: AC
  Administered 2024-05-14: 150 mg via INTRAVENOUS
  Filled 2024-05-12: qty 100

## 2024-05-12 MED ORDER — AMIODARONE HCL IN DEXTROSE 360-4.14 MG/200ML-% IV SOLN
60.0000 mg/h | INTRAVENOUS | Status: AC
  Filled 2024-05-12 (×2): qty 200

## 2024-05-12 NOTE — H&P (Incomplete)
 Cardiology Admission History and Physical:    Patient ID: Tanya Hogan MRN: [id number]; DOB: 1950/09/22   Admission date: 05/12/2024  Primary Care Provider: [name], MD Primary Cardiologist: Redell Shallow, MD  Primary Electrophysiologist:  Danelle Birmingham, MD   Chief Complaint:  Emesis  Patient Profile:   Tanya Hogan is a 73 y.o. female with nonischemic cardiomyopathy with EF 20%, VT on amiodarone , history of complete heart block now s/p BiV pacemaker, hypertension, hyperlipidemia, diabetes  History of Present Illness:   Tanya Hogan ***   Past Medical History:  Diagnosis Date  . Alkaline phosphatase elevation   . Asthma   . Breast cancer (HCC) 2011  . Carpal tunnel syndrome    bilateral  . Chronic HFrEF (heart failure with reduced ejection fraction) (HCC)   . Cirrhosis, non-alcoholic (HCC)   . Closed fracture of unspecified part of radius (alone)   . DDD (degenerative disc disease), lumbar   . Depression    pt denies  . Diabetes mellitus without complication (HCC)   . Enlarged heart   . Fibromyalgia   . GERD (gastroesophageal reflux disease)   . History of kidney stones   . HTN (hypertension)   . Hypothyroidism   . IBS (irritable bowel syndrome)   . LBBB (left bundle branch block)   . Microcytic anemia 05/14/2017  . Mitral regurgitation   . NICM (nonischemic cardiomyopathy) (HCC)   . Pneumonia   . Presence of permanent cardiac pacemaker   . Pulmonary hypertension (HCC)   . PVD (peripheral vascular disease) (HCC)    2019  . Sleep apnea    uses cpap  . Tricuspid regurgitation   . Ventricular tachyarrhythmia [facility])     Past Surgical History:  Procedure Laterality Date  . BIV PACEMAKER GENERATOR CHANGEOUT N/A 11/03/2021   Procedure: BIV PACEMAKER GENERATOR CHANGEOUT;  Surgeon: Birmingham Danelle ORN, MD;  Location: [facility] INVASIVE CV LAB;  Service: Cardiovascular;  Laterality: N/A;  . BREAST LUMPECTOMY Left 2011  . COLONOSCOPY    . FOOT SURGERY    .  KNEE SURGERY    . LEAD REVISION/REPAIR N/A 11/03/2021   Procedure: LEAD REVISION/REPAIR;  Surgeon: Birmingham Danelle ORN, MD;  Location: [facility] INVASIVE CV LAB;  Service: Cardiovascular;  Laterality: N/A;  . MASTECTOMY Right    05/2019  . PACEMAKER GENERATOR CHANGE N/A 08/23/2014   Procedure: PACEMAKER GENERATOR CHANGE;  Surgeon: Danelle ORN Birmingham, MD;  Location: [facility] CATH LAB;  Service: Cardiovascular;  Laterality: N/A;  . PACEMAKER PLACEMENT  2009   [facility]  . RECTAL SURGERY  1976  . REDUCTION MAMMAPLASTY Left    05/2019  . TOTAL SHOULDER ARTHROPLASTY Right 03/10/2018  . TOTAL SHOULDER ARTHROPLASTY Right 03/10/2018   Procedure: RIGHT TOTAL SHOULDER ARTHROPLASTY;  Surgeon: Dozier Soulier, MD;  Location: [facility] OR;  Service: Orthopedics;  Laterality: Right;  . TUBAL LIGATION       Medications Prior to Admission: Prior to Admission medications   Medication Sig Start Date End Date Taking? Authorizing Provider  acetaminophen  (TYLENOL ) 325 MG tablet Take 325 mg by mouth every 6 (six) hours as needed for mild pain (pain score 1-3).    [provider]  albuterol  (PROVENTIL ) (2.5 MG/3ML) 0.083% nebulizer solution Take 2.5 mg by nebulization every 6 (six) hours as needed for wheezing or shortness of breath.    [provider]  alendronate  (FOSAMAX ) 70 MG tablet TAKE 1 TABLET BY MOUTH EVERY 7  DAYS WITH FULL GLASS OF WATER ON AN EMPTY STOMACH 08/13/23  [name], MD  AMBULATORY NON FORMULARY MEDICATION Medication Name: Tubing and supplies for nebulizer. Fax - [phone] 08/14/19   [name], MD  amiodarone  (PACERONE ) 200 MG tablet Take 1 tablet (200 mg total) by mouth daily. 11/10/23   Pietro Redell RAMAN, MD  aspirin  81 MG chewable tablet Chew 81 mg by mouth daily.    [provider]  Calcium  Carbonate-Vit D-Min (CALCIUM  600+D PLUS MINERALS) 600-400 MG-UNIT TABS 1 tab p.o. twice daily 11/02/23   Curtis Debby PARAS, MD  carvedilol  (COREG ) 3.125 MG  tablet Take 1 tablet (3.125 mg total) by mouth 2 (two) times daily. 10/04/23 05/23/24  Pietro Redell RAMAN, MD  ciprofloxacin  (CIPRO ) 500 MG/5ML (10%) suspension Take 500 mg by mouth 2 (two) times daily. 05/10/24 05/14/24  [provider]  Continuous Glucose Receiver (FREESTYLE LIBRE 3 READER) DEVI Use for continuous glucose monitoring 04/10/24   [name], MD  Continuous Glucose Sensor (FREESTYLE LIBRE 3 SENSOR) MISC by Does not apply route. Change every 14 days    [provider]  EPINEPHrine  0.3 mg/0.3 mL IJ SOAJ injection Inject 0.3 mg into the muscle as needed for anaphylaxis. 08/27/23   [name], MD  estradiol (ESTRACE) 0.1 MG/GM vaginal cream Place 1 Applicatorful vaginally 3 (three) times a week. Patient not taking: Reported on 05/11/2024    [provider]  fexofenadine  (ALLEGRA ) 180 MG tablet Take 180 mg by mouth daily. Per patient break tablet in half taking 1/2 tab twice a day    [provider]  fluticasone -salmeterol (WIXELA INHUB ) 250-50 MCG/ACT AEPB Inhale 1 puff into the lungs in the morning and at bedtime.    [provider]  GEMTESA 75 MG TABS Take 75 mg by mouth daily. 01/11/24   [provider]  glucose blood test strip To be used to check blood glucose Dx:E11.8 FreeStyle Precision blood glucose for Freestyle Libre 2 05/02/24   [name], MD  isosorbide  mononitrate (IMDUR ) 30 MG 24 hr tablet Take 1 tablet (30 mg total) by mouth daily. 10/04/23   Pietro Redell RAMAN, MD  levothyroxine  (SYNTHROID ) 112 MCG tablet TAKE 1 TABLET BY MOUTH DAILY 05/04/24   [name], [name] D, MD  lubiprostone (AMITIZA) 24 MCG capsule Take 24 mcg by mouth 2 (two) times daily with a meal. Patient reports provider has decreased to every three days. Patient not taking: Reported on 05/11/2024    [provider]  metFORMIN  (GLUCOPHAGE -XR) 500 MG 24 hr tablet TAKE 1 TABLET BY MOUTH TWICE  DAILY WITH MEALS 04/19/24   [name], MD  metroNIDAZOLE  (FLAGYL ) 500 MG tablet Take 500 mg by mouth 2 (two) times daily. 05/10/24 05/14/24  [provider]  Multiple Vitamins-Minerals (MULTIPLE VITAMINS/WOMENS PO) Take 1 tablet by mouth daily. Patient taking differently: Take 1 tablet by mouth daily. Patient takes MVI with iron    [provider]  Multiple Vitamins-Minerals (OCUVITE EYE + MULTI) TABS Take 1 tablet by mouth in the morning.    [provider]  pantoprazole  (PROTONIX ) 40 MG tablet TAKE 1 TABLET BY MOUTH DAILY  BEFORE BREAKFAST 10/20/22   [name], MD  pravastatin  (PRAVACHOL ) 40 MG tablet Take 40 mg by mouth daily. 01/26/24   [provider]  spironolactone  (ALDACTONE ) 25 MG tablet Take 0.5 tablets (12.5 mg total) by mouth daily. 02/16/24 05/16/24  Pietro Redell RAMAN, MD  Tiotropium Bromide  Monohydrate (SPIRIVA  RESPIMAT) 1.25 MCG/ACT AERS Inhale 1 puff into the lungs daily.  [provider]  valACYclovir  (VALTREX ) 1000 MG tablet Take 1 tablet (1,000 mg total) by mouth 2 (two) times daily. 05/17/23   Alvia Bring, DO     Allergies:    Allergies  Allergen Reactions  . Injectafer [Ferric Carboxymaltose] Shortness Of Breath    Mild SOB following injectafer infusion Mild SOB following injectafer infusion   . Penicillins Anaphylaxis and Rash    Has patient had a PCN reaction causing immediate rash, facial/tongue/throat swelling, SOB or lightheadedness with hypotension: Yes Has patient had a PCN reaction causing severe rash involving mucus membranes or skin necrosis: No Has patient had a PCN reaction that required hospitalization: No Has patient had a PCN reaction occurring within the last 10 years: No If all of the above answers are NO, then may proceed with Cephalosporin use. Stuttered with medication    . Accupril [Quinapril Hcl] Other (See Comments)    Hyperkalemia=high potassium level  . Aleve  [Naproxen ] Swelling  . Celebrex [Celecoxib]      ulcers  . Cozaar  [Losartan  Potassium] Itching  . Dml Forte Rash  . Flonase  Bartholomew.Bold ] Other (See Comments)    Nasal ulcer.    . Lactose Intolerance (Gi) Other (See Comments)    Increased calcium , sneezing, nasal issues  . Levomenol Other (See Comments)    unknown unknown   . Lipitor [Atorvastatin ] Other (See Comments)    muscle aches/pains  . Nasonex  [Mometasone  Furoate] Other (See Comments)    nosebleed  . Xyzal [Levocetirizine] Other (See Comments)    Doesn't recall reaction type  . Bentyl  [Dicyclomine ]   . Chamomile Other (See Comments)    unknown  . Entresto [Sacubitril-Valsartan]     Hyperkalemia  . Singulair  [Montelukast ] Other (See Comments)    dizziness  . Vesicare  [Solifenacin ] Other (See Comments)    stopped me from urinating  . Aspirin  Other (See Comments)    Pt states burns holes in stomach, ulcers   . Baking Soda-Fluoride [Sodium Fluoride] Itching and Rash  . Clindamycin /Lincomycin Rash  . Codeine Nausea And Vomiting  . Furosemide  Other (See Comments)    Nose bleed  . Lactose Other (See Comments)  . Lanolin-Petrolatum Rash  . Miralax  [Polyethylene Glycol] Rash  . Quinine Rash  . Reclast  [Zoledronic  Acid] Rash and Other (See Comments)    shaking  . Rifampin Other (See Comments)    DOES NOT REMEMBER THIS MEDICATION OR ALLERGY  . Sulfa Antibiotics Rash  . Sulfonamide Derivatives Rash  . Tramadol  Rash    Anxiety  . Wellbutrin  [Bupropion ] Other (See Comments)    Muscle aches, pain, made my skin crawl  . Wool Alcohol [Lanolin] Rash    Social History:   Social History   Socioeconomic History  . Marital status: Divorced    Spouse name: Not on file  . Number of children: 2  . Years of education: 27  . Highest education level: Associate degree: academic program  Occupational History  . Occupation: quality insurcance in Sports coach    Comment: retired  Tobacco Use  . Smoking status: Former    Current packs/day: 0.00    Types: Cigarettes     Quit date: 08/24/1966    Years since quitting: 57.7  . Smokeless tobacco: Never  Vaping Use  . Vaping status: Never Used  Substance and Sexual Activity  . Alcohol use: No  . Drug use: No  . Sexual activity: Not Currently  Other Topics Concern  . Not on file  Social History Narrative   Retired. Lives  with and helps her brother. Likes Risk manager, Nutritional therapist and music. Loves animals.   Social Drivers of Health   Financial Resource Strain: Medium Risk (08/10/2023)   Overall Financial Resource Strain (CARDIA)   . Difficulty of Paying Living Expenses: Somewhat hard  Food Insecurity: No Food Insecurity (05/11/2024)   Hunger Vital Sign   . Worried About Programme researcher, broadcasting/film/video in the Last Year: Never true   . Ran Out of Food in the Last Year: Never true  Transportation Needs: No Transportation Needs (05/11/2024)   PRAPARE - Transportation   . Lack of Transportation (Medical): No   . Lack of Transportation (Non-Medical): No  Physical Activity: Sufficiently Active (08/10/2023)   Exercise Vital Sign   . Days of Exercise per Week: 3 days   . Minutes of Exercise per Session: 50 min  Recent Concern: Physical Activity - Insufficiently Active (07/12/2023)   Exercise Vital Sign   . Days of Exercise per Week: 1 day   . Minutes of Exercise per Session: 10 min  Stress: No Stress Concern Present (05/08/2024)   Received from [facility] - Occupational Stress Questionnaire   . Do you feel stress - tense, restless, nervous, or anxious, or unable to sleep at night because your mind is troubled all the time - these days?: Not at all  Social Connections: Moderately Integrated (08/10/2023)   Social Connection and Isolation Panel   . Frequency of Communication with Friends and Family: More than three times a week   . Frequency of Social Gatherings with Friends and Family: More than three times a week   . Attends Religious Services: More than 4 times per  year   . Active Member of Clubs or Organizations: Yes   . Attends Banker Meetings: More than 4 times per year   . Marital Status: Divorced  Catering manager Violence: Not At Risk (05/11/2024)   Humiliation, Afraid, Rape, and Kick questionnaire   . Fear of Current or Ex-Partner: No   . Emotionally Abused: No   . Physically Abused: No   . Sexually Abused: No    Family History:  *** The patient's family history includes Breast cancer in an other family member; Heart disease in her brother, father, and mother; Heart failure in an other family member; Hypertension in an other family member; Melanoma in her mother; Parkinson's disease in her mother.    Review of Systems: [y] = yes, [ ]  = no    General: Weight gain [ ] ; Weight loss [ ] ; Anorexia [ ] ; Fatigue [ ] ; Fever [ ] ; Chills [ ] ; Weakness [ ]   Cardiac: Chest pain/pressure [ ] ; Resting SOB [ ] ; Exertional SOB [ ] ; Orthopnea [ ] ; Pedal Edema [ ] ; Palpitations [ ] ; Syncope [ ] ; Presyncope [ ] ; Paroxysmal nocturnal dyspnea[ ]   Pulmonary: Cough [ ] ; Wheezing[ ] ; Hemoptysis[ ] ; Sputum [ ] ; Snoring [ ]   GI: Vomiting[ ] ; Dysphagia[ ] ; Melena[ ] ; Hematochezia [ ] ; Heartburn[ ] ; Abdominal pain [ ] ; Constipation [ ] ; Diarrhea [ ] ; BRBPR [ ]   GU: Hematuria[ ] ; Dysuria [ ] ; Nocturia[ ]   Vascular: Pain in legs with walking [ ] ; Pain in feet with lying flat [ ] ; Non-healing sores [ ] ; Stroke [ ] ; TIA [ ] ; Slurred speech [ ] ;  Neuro: Headaches[ ] ; Vertigo[ ] ; Seizures[ ] ; Paresthesias[ ] ;Blurred vision [ ] ; Diplopia [ ] ; Vision changes [ ]   Ortho/Skin: Arthritis [ ] ; Joint pain [ ] ;  Muscle pain [ ] ; Joint swelling [ ] ; Back Pain [ ] ; Rash [ ]   Psych: Depression[ ] ; Anxiety[ ]   Heme: Bleeding problems [ ] ; Clotting disorders [ ] ; Anemia [ ]   Endocrine: Diabetes [ ] ; Thyroid  dysfunction[ ]   Physical Exam/Data:   Vitals:   05/12/24 2215 05/12/24 2230 05/12/24 2245 05/12/24 2300  BP: (!) 90/45 117/61 117/61 (!) 115/97  Pulse: 74 77 84 69   Resp: (!) 21 (!) 25 18 (!) 24  Temp:      TempSrc:      SpO2: 97% 96% 99% 97%  Height:       No intake or output data in the 24 hours ending 05/12/24 2315 There were no vitals filed for this visit. Body mass index is 36.66 kg/m.  General:  Well nourished, well developed, in no acute distress*** HEENT: normal Neck: no*** JVD Vascular: No carotid bruits; FA pulses 2+ bilaterally without bruits  Cardiac:  normal S1, S2; RRR; no murmur *** Lungs:  clear to auscultation bilaterally, no wheezing, rhonchi or rales  Abd: soft, nontender, no hepatomegaly  Ext: no*** edema Musculoskeletal:  No deformities Skin: warm and dry  Psych:  Normal affect    EKG:  The ECG that was done *** was personally reviewed and demonstrates ***  Relevant CV Studies: ***  Laboratory Data: ***  Radiology/Studies:  DG Chest Port 1 View Result Date: 05/12/2024 EXAM: 1 VIEW XRAY OF THE CHEST 05/12/2024 09:45:00 PM COMPARISON: 02/16/2024 CLINICAL HISTORY: Shortness of breath. Pt recently d/c from hospital for diverticulitis Wednesday. Diarrhea resolved, now c/o emesis today x 1 hour. Reports chills and diaphoresis. C/o joint pain. FINDINGS: LINES, TUBES AND DEVICES: Defibrillator pads on left chest. Left subclavian approach biventricular pacer with leads in right atrium, right ventricle, and coronary sinus. LUNGS AND PLEURA: Increasing interstitial markings, favoring possible mild perihilar edema, although chronic. No focal pulmonary opacity. No pleural effusion. No pneumothorax. HEART AND MEDIASTINUM: Cardiomegaly. BONES AND SOFT TISSUES: Status post right shoulder arthroplasty. No acute osseous abnormality. IMPRESSION: 1. Possible mild perihilar edema, chronic. 2. Cardiomegaly. Electronically signed by: Pinkie Pebbles MD 05/12/2024 09:56 PM EDT RP Workstation: [id number]    Assessment and Plan:   SVT with Abberancy ***  - EKG 05/12/2024 21:26- SVT with abberancy with Negative   Acute Diverticulitis   Diabetes Mellitus:  Hypertension: Continue carvedilol . Hold isosorbide  mononitrate 30 mg as she is receovering from hypotension  HFrEF: Hold lasix  40 mg and spironolactone   Hypothyroidism:  Continue levothryoxine 112 mcg  GERD: Continue pantoprazole      Severity of Illness: The appropriate patient status for this patient is INPATIENT. Inpatient status is judged to be reasonable and necessary in order to provide the required intensity of service to ensure the patient's safety. The patient's presenting symptoms, physical exam findings, and initial radiographic and laboratory data in the context of their chronic comorbidities is felt to place them at high risk for further clinical deterioration. Furthermore, it is not anticipated that the patient will be medically stable for discharge from the hospital within 2 midnights of admission.   * I certify that at the point of admission it is my clinical judgment that the patient will require inpatient hospital care spanning beyond 2 midnights from the point of admission due to high intensity of service, high risk for further deterioration and high frequency of surveillance required.*   For questions or updates, please contact [facility] Please consult [email or web address] for contact info under  Signed, Merlene JAYSON Blood, MD  05/12/2024 11:15 PM   Merlene Blood, MD MS  Cardiology [facility]

## 2024-05-12 NOTE — ED Triage Notes (Signed)
 Pt recently d/c from hospital for diverticulitis Wednesday.  Diarrhea resolved, now c/o emesis today x 1 hour. Reports chills and diaphoresis.   C/o joint pain.

## 2024-05-12 NOTE — ED Notes (Signed)
 Arrived to bedside with RN and MD.  Pt is conscious alert and oriented.  Also presents diaphoretic and pale.  States she is having intense right shoulder and arm pain.  Pt is well mentating.  Bilateral iv access established.

## 2024-05-12 NOTE — Progress Notes (Signed)
 Remote PPM Transmission

## 2024-05-12 NOTE — ED Notes (Signed)
 Cardioverted. 200J

## 2024-05-12 NOTE — ED Notes (Signed)
 In pt room for cardiovert, Pt placed on ETCO2 monitor at 2lt O2. Pt stable throughout. Ambu bag and suction at bedside.

## 2024-05-12 NOTE — ED Notes (Signed)
 Report has been called to the receiving facility Rn for 2H bed 21.

## 2024-05-12 NOTE — H&P (Signed)
 Cardiology Admission History and Physical:    Patient ID: Tanya Hogan MRN: [id number]; DOB: [birth year]   Admission date: 05/12/2024  Primary Care Provider: [name], MD Primary Cardiologist: Redell Shallow, MD  Primary Electrophysiologist:  Danelle Birmingham, MD   Chief Complaint:  Emesis  Patient Profile:   Tanya Hogan is a 73 y.o. female with nonischemic cardiomyopathy with EF 20%, VT on amiodarone , history of complete heart block now s/p BiV pacemaker, hypertension, hyperlipidemia, diabetes  History of Present Illness:   Tanya Hogan states that she was loading something into her car and all of a sudden felt the blood leave her ears.  The sensation that she typically has when she goes into ventricular tachycardia.  She also stated that she felt whole body and joint weakness and right shoulder pain. She also had 1 bout of emesis.  Upon arrival to the emergency department her heart rate was 186 bpm and a monomorphic ventricular tachycardia. Temperature was 97.2. Blood pressure was 103/75 and she was satting well on room air.  She was given 2 boluses of amiodarone  150 mg followed by a synchronized cardioversion which she converted into in a- sensed v-paced rhythm.  Regarding her VT, she was diagnosed in June 2021 after a presyncopal episode.  Device interrogation showed multiple episodes of VT. coronary angiogram without obstructive coronary artery disease.  She was started on amiodarone  at that time.  She has been admitted several times in the last 4 years for VT.  An upgrade of her BiV pacemaker to a BiV defibrillator has been discussed in the past, but not yet complete.  Of note, she was recently admitted to [facility] from 9/14-9/17/2025 for acute diverticulitis. She was discharged with 4 remaining days of ciprofloxacin  and flagyl .   Past Medical History:  Diagnosis Date   Alkaline phosphatase elevation    Asthma    Breast cancer (HCC) 2011   Carpal tunnel syndrome     bilateral   Chronic HFrEF (heart failure with reduced ejection fraction) (HCC)    Cirrhosis, non-alcoholic (HCC)    Closed fracture of unspecified part of radius (alone)    DDD (degenerative disc disease), lumbar    Depression    pt denies   Diabetes mellitus without complication (HCC)    Enlarged heart    Fibromyalgia    GERD (gastroesophageal reflux disease)    History of kidney stones    HTN (hypertension)    Hypothyroidism    IBS (irritable bowel syndrome)    LBBB (left bundle branch block)    Microcytic anemia 05/14/2017   Mitral regurgitation    NICM (nonischemic cardiomyopathy) (HCC)    Pneumonia    Presence of permanent cardiac pacemaker    Pulmonary hypertension (HCC)    PVD (peripheral vascular disease) (HCC)    2019   Sleep apnea    uses cpap   Tricuspid regurgitation    Ventricular tachyarrhythmia [facility])     Past Surgical History:  Procedure Laterality Date   BIV PACEMAKER GENERATOR CHANGEOUT N/A 11/03/2021   Procedure: BIV PACEMAKER GENERATOR CHANGEOUT;  Surgeon: Birmingham Danelle ORN, MD;  Location: [facility] INVASIVE CV LAB;  Service: Cardiovascular;  Laterality: N/A;   BREAST LUMPECTOMY Left 2011   COLONOSCOPY     FOOT SURGERY     KNEE SURGERY     LEAD REVISION/REPAIR N/A 11/03/2021   Procedure: LEAD REVISION/REPAIR;  Surgeon: Birmingham Danelle ORN, MD;  Location: [facility] INVASIVE CV LAB;  Service: Cardiovascular;  Laterality: N/A;   MASTECTOMY  Right    05/2019   PACEMAKER GENERATOR CHANGE N/A 08/23/2014   Procedure: PACEMAKER GENERATOR CHANGE;  Surgeon: Danelle LELON Birmingham, MD;  Location: [facility] CATH LAB;  Service: Cardiovascular;  Laterality: N/A;   PACEMAKER PLACEMENT  2009   [facility]   RECTAL SURGERY  1976   REDUCTION MAMMAPLASTY Left    05/2019   TOTAL SHOULDER ARTHROPLASTY Right 03/10/2018   TOTAL SHOULDER ARTHROPLASTY Right 03/10/2018   Procedure: RIGHT TOTAL SHOULDER ARTHROPLASTY;  Surgeon: Dozier Soulier, MD;  Location: [facility] OR;  Service: Orthopedics;  Laterality:  Right;   TUBAL LIGATION       Medications Prior to Admission: Prior to Admission medications   Medication Sig Start Date End Date Taking? Authorizing Provider  acetaminophen  (TYLENOL ) 325 MG tablet Take 325 mg by mouth every 6 (six) hours as needed for mild pain (pain score 1-3).    [provider]  albuterol  (PROVENTIL ) (2.5 MG/3ML) 0.083% nebulizer solution Take 2.5 mg by nebulization every 6 (six) hours as needed for wheezing or shortness of breath.    [provider]  alendronate  (FOSAMAX ) 70 MG tablet TAKE 1 TABLET BY MOUTH EVERY 7  DAYS WITH FULL GLASS OF WATER  ON AN EMPTY STOMACH 08/13/23   [name], MD  AMBULATORY NON FORMULARY MEDICATION Medication Name: Tubing and supplies for nebulizer. Fax - [phone] 08/14/19   [name], MD  amiodarone  (PACERONE ) 200 MG tablet Take 1 tablet (200 mg total) by mouth daily. 11/10/23   Pietro Redell RAMAN, MD  aspirin  81 MG chewable tablet Chew 81 mg by mouth daily.    [provider]  Calcium  Carbonate-Vit D-Min (CALCIUM  600+D PLUS MINERALS) 600-400 MG-UNIT TABS 1 tab p.o. twice daily 11/02/23   Curtis Debby PARAS, MD  carvedilol  (COREG ) 3.125 MG tablet Take 1 tablet (3.125 mg total) by mouth 2 (two) times daily. 10/04/23 05/23/24  Pietro Redell RAMAN, MD  ciprofloxacin  (CIPRO ) 500 MG/5ML (10%) suspension Take 500 mg by mouth 2 (two) times daily. 05/10/24 05/14/24  [provider]  Continuous Glucose Receiver (FREESTYLE LIBRE 3 READER) DEVI Use for continuous glucose monitoring 04/10/24   [name], MD  Continuous Glucose Sensor (FREESTYLE LIBRE 3 SENSOR) MISC by Does not apply route. Change every 14 days    [provider]  EPINEPHrine  0.3 mg/0.3 mL IJ SOAJ injection Inject 0.3 mg into the muscle as needed for anaphylaxis. 08/27/23   [name], MD  estradiol (ESTRACE) 0.1 MG/GM vaginal cream Place 1 Applicatorful vaginally 3 (three) times a week. Patient not  taking: Reported on 05/11/2024    [provider]  fexofenadine  (ALLEGRA ) 180 MG tablet Take 180 mg by mouth daily. Per patient break tablet in half taking 1/2 tab twice a day    [provider]  fluticasone -salmeterol (WIXELA INHUB ) 250-50 MCG/ACT AEPB Inhale 1 puff into the lungs in the morning and at bedtime.    [provider]  GEMTESA 75 MG TABS Take 75 mg by mouth daily. 01/11/24   [provider]  glucose blood test strip To be used to check blood glucose Dx:E11.8 FreeStyle Precision blood glucose for Freestyle Libre 2 05/02/24   [name], MD  isosorbide  mononitrate (IMDUR ) 30 MG 24 hr tablet Take 1 tablet (30 mg total) by mouth daily. 10/04/23   Pietro Redell RAMAN, MD  levothyroxine  (SYNTHROID ) 112 MCG tablet TAKE 1 TABLET BY MOUTH DAILY 05/04/24   [name], MD  lubiprostone (AMITIZA) 24 MCG capsule Take 24  mcg by mouth 2 (two) times daily with a meal. Patient reports provider has decreased to every three days. Patient not taking: Reported on 05/11/2024    [provider]  metFORMIN  (GLUCOPHAGE -XR) 500 MG 24 hr tablet TAKE 1 TABLET BY MOUTH TWICE  DAILY WITH MEALS 04/19/24   [name], MD  metroNIDAZOLE  (FLAGYL ) 500 MG tablet Take 500 mg by mouth 2 (two) times daily. 05/10/24 05/14/24  [provider]  Multiple Vitamins-Minerals (MULTIPLE VITAMINS/WOMENS PO) Take 1 tablet by mouth daily. Patient taking differently: Take 1 tablet by mouth daily. Patient takes MVI with iron    [provider]  Multiple Vitamins-Minerals (OCUVITE EYE + MULTI) TABS Take 1 tablet by mouth in the morning.    [provider]  pantoprazole  (PROTONIX ) 40 MG tablet TAKE 1 TABLET BY MOUTH DAILY  BEFORE BREAKFAST 10/20/22   [name], MD  pravastatin  (PRAVACHOL ) 40 MG tablet Take 40 mg by mouth daily. 01/26/24   [provider]  spironolactone  (ALDACTONE ) 25 MG tablet Take 0.5 tablets (12.5 mg total) by  mouth daily. 02/16/24 05/16/24  Pietro Redell RAMAN, MD  Tiotropium Bromide  Monohydrate (SPIRIVA  RESPIMAT) 1.25 MCG/ACT AERS Inhale 1 puff into the lungs daily.    [provider]  valACYclovir  (VALTREX ) 1000 MG tablet Take 1 tablet (1,000 mg total) by mouth 2 (two) times daily. 05/17/23   Alvia Bring, DO     Allergies:    Allergies  Allergen Reactions   Injectafer [Ferric Carboxymaltose] Shortness Of Breath    Mild SOB following injectafer infusion Mild SOB following injectafer infusion    Penicillins Anaphylaxis and Rash    Has patient had a PCN reaction causing immediate rash, facial/tongue/throat swelling, SOB or lightheadedness with hypotension: Yes Has patient had a PCN reaction causing severe rash involving mucus membranes or skin necrosis: No Has patient had a PCN reaction that required hospitalization: No Has patient had a PCN reaction occurring within the last 10 years: No If all of the above answers are NO, then may proceed with Cephalosporin use. Stuttered with medication     Accupril [Quinapril Hcl] Other (See Comments)    Hyperkalemia=high potassium level   Aleve  [Naproxen ] Swelling   Celebrex [Celecoxib]     ulcers   Cozaar  [Losartan  Potassium] Itching   Dml Forte Rash   Flonase  [Fluticasone ] Other (See Comments)    Nasal ulcer.     Lactose Intolerance (Gi) Other (See Comments)    Increased calcium , sneezing, nasal issues   Levomenol Other (See Comments)    unknown unknown    Lipitor [Atorvastatin ] Other (See Comments)    muscle aches/pains   Nasonex  [Mometasone  Furoate] Other (See Comments)    nosebleed   Xyzal [Levocetirizine] Other (See Comments)    Doesn't recall reaction type   Bentyl  [Dicyclomine ]    Chamomile Other (See Comments)    unknown   Entresto [Sacubitril-Valsartan]     Hyperkalemia   Singulair  [Montelukast ] Other (See Comments)    dizziness   Vesicare  [Solifenacin ] Other (See Comments)    stopped me from urinating    Aspirin  Other (See Comments)    Pt states burns holes in stomach, ulcers    Baking Soda-Fluoride [Sodium Fluoride] Itching and Rash   Clindamycin /Lincomycin Rash   Codeine Nausea And Vomiting   Furosemide  Other (See Comments)    Nose bleed   Lactose Other (See Comments)   Lanolin-Petrolatum Rash   Miralax  [Polyethylene Glycol] Rash   Quinine Rash   Reclast  [Zoledronic  Acid] Rash  and Other (See Comments)    shaking   Rifampin Other (See Comments)    DOES NOT REMEMBER THIS MEDICATION OR ALLERGY   Sulfa Antibiotics Rash   Sulfonamide Derivatives Rash   Tramadol  Rash    Anxiety   Wellbutrin  [Bupropion ] Other (See Comments)    Muscle aches, pain, made my skin crawl   Wool Alcohol [Lanolin] Rash    Social History:   Social History   Socioeconomic History   Marital status: Divorced    Spouse name: Not on file   Number of children: 2   Years of education: 14   Highest education level: Associate degree: academic program  Occupational History   Occupation: quality insurcance in Sports coach    Comment: retired  Tobacco Use   Smoking status: Former    Current packs/day: 0.00    Types: Cigarettes    Quit date: 08/24/1966    Years since quitting: 57.7   Smokeless tobacco: Never  Vaping Use   Vaping status: Never Used  Substance and Sexual Activity   Alcohol use: No   Drug use: No   Sexual activity: Not Currently  Other Topics Concern   Not on file  Social History Narrative   Retired. Lives with and helps her brother. Likes Risk manager, Nutritional therapist and music. Loves animals.   Social Drivers of Health   Financial Resource Strain: Medium Risk (08/10/2023)   Overall Financial Resource Strain (CARDIA)    Difficulty of Paying Living Expenses: Somewhat hard  Food Insecurity: No Food Insecurity (05/11/2024)   Hunger Vital Sign    Worried About Running Out of Food in the Last Year: Never true    Ran Out of Food in the Last Year: Never true  Transportation Needs: No  Transportation Needs (05/11/2024)   PRAPARE - Administrator, Civil Service (Medical): No    Lack of Transportation (Non-Medical): No  Physical Activity: Sufficiently Active (08/10/2023)   Exercise Vital Sign    Days of Exercise per Week: 3 days    Minutes of Exercise per Session: 50 min  Recent Concern: Physical Activity - Insufficiently Active (07/12/2023)   Exercise Vital Sign    Days of Exercise per Week: 1 day    Minutes of Exercise per Session: 10 min  Stress: No Stress Concern Present (05/08/2024)   Received from [facility] - Occupational Stress Questionnaire    Do you feel stress - tense, restless, nervous, or anxious, or unable to sleep at night because your mind is troubled all the time - these days?: Not at all  Social Connections: Moderately Integrated (08/10/2023)   Social Connection and Isolation Panel    Frequency of Communication with Friends and Family: More than three times a week    Frequency of Social Gatherings with Friends and Family: More than three times a week    Attends Religious Services: More than 4 times per year    Active Member of [employer] or Organizations: Yes    Attends Banker Meetings: More than 4 times per year    Marital Status: Divorced  Intimate Partner Violence: Not At Risk (05/11/2024)   Humiliation, Afraid, Rape, and Kick questionnaire    Fear of Current or Ex-Partner: No    Emotionally Abused: No    Physically Abused: No    Sexually Abused: No    Family History:   The patient's family history includes Breast cancer in an other family member; Heart disease  in her brother, father, and mother; Heart failure in an other family member; Hypertension in an other family member; Melanoma in her mother; Parkinson's disease in her mother.    Review of Systems: [y] = yes, [ ]  = no    General: Weight gain [ ] ; Weight loss [ ] ; Anorexia [ ] ; Fatigue [ y]; Fever [ ] ; Chills [ ] ; Weakness [  y]  Cardiac: Chest pain/pressure [ ] ; Resting SOB [ ] ; Exertional SOB [ ] ; Orthopnea [ ] ; Pedal Edema [ ] ; Palpitations [ ] ; Syncope [ ] ; Presyncope [ ] ; Paroxysmal nocturnal dyspnea[ ]   Pulmonary: Cough [ ] ; Wheezing[ ] ; Hemoptysis[ ] ; Sputum [ ] ; Snoring [ ]   GI: Vomiting[ y]; Dysphagia[ ] ; Melena[ ] ; Hematochezia [ ] ; Heartburn[ ] ; Abdominal pain [ ] ; Constipation [ ] ; Diarrhea [ ] ; BRBPR [ ]   GU: Hematuria[ ] ; Dysuria [ ] ; Nocturia[ ]   Vascular: Pain in legs with walking [ ] ; Pain in feet with lying flat [ ] ; Non-healing sores [ ] ; Stroke [ ] ; TIA [ ] ; Slurred speech [ ] ;  Neuro: Headaches[ ] ; Vertigo[ ] ; Seizures[ ] ; Paresthesias[ ] ;Blurred vision [ ] ; Diplopia [ ] ; Vision changes [ ]   Ortho/Skin: Arthritis [ ] ; Joint pain [ ] ; Muscle pain [ ] ; Joint swelling [ ] ; Back Pain [ ] ; Rash [ ]   Psych: Depression[ ] ; Anxiety[ ]   Heme: Bleeding problems [ ] ; Clotting disorders [ ] ; Anemia [ ]   Endocrine: Diabetes [ ] ; Thyroid  dysfunction[ ]   Physical Exam/Data:   Vitals:   05/12/24 2215 05/12/24 2230 05/12/24 2245 05/12/24 2300  BP: (!) 90/45 117/61 117/61 (!) 115/97  Pulse: 74 77 84 69  Resp: (!) 21 (!) 25 18 (!) 24  Temp:      TempSrc:      SpO2: 97% 96% 99% 97%  Height:       No intake or output data in the 24 hours ending 05/12/24 2315 There were no vitals filed for this visit. Body mass index is 36.66 kg/m.  General:  Well nourished, well developed, in no acute distress HEENT: normal Neck: JVP of 14 Vascular: 2+ bilateral radial pulses Cardiac:  normal S1, S2; RRR Lungs:  clear to auscultation bilaterally, no wheezing, rhonchi or rales  Abd: Mild tenderness in right upper and lower quadrant Ext: no edema Musculoskeletal:  No deformities Skin: warm and dry  Psych:  Normal affect    EKG:   May 12, 2024 21:26 EKG ventricular tachycardia at 186 bpm May 13, 2019 25:20 1:56 PM atrial sensed ventricularly paced rhythm with left bundle branch block with more ST  depression inferiorly and laterally when compared to her EKG on August 14, 2023 at 1902 there are no significant changes  Relevant CV Studies: May 2023 echocardiogram 1. Left ventricular ejection fraction, by estimation, is 20 to 25%. The  left ventricle has severely decreased function. The left ventricle  demonstrates regional wall motion abnormalities (see scoring  diagram/findings for description). The left  ventricular internal cavity size was mildly to moderately dilated. Left  ventricular diastolic parameters are consistent with Grade I diastolic  dysfunction (impaired relaxation). Elevated left ventricular end-diastolic  pressure.   2. Trivial mitral valve regurgitation.   3. The aortic valve is tricuspid.   4. There is normal pulmonary artery systolic pressure.   Laboratory Data: Component     Latest Ref Rng 05/12/2024  Sodium     135 - 145 mmol/L 140   Potassium     3.5 - 5.1  mmol/L 4.3   Chloride     98 - 111 mmol/L 104   CO2     22 - 32 mmol/L 19 (L)   Glucose     70 - 99 mg/dL 809 (H)   BUN     8 - 23 mg/dL 17   Creatinine     9.55 - 1.00 mg/dL 8.94 (H)   Calcium      8.9 - 10.3 mg/dL 88.9 (H)   GFR, Estimated     >60 mL/min 55 (L)   Anion gap     5 - 15  17 (H)     Component     Latest Ref Rng 05/12/2024  WBC     4.0 - 10.5 K/uL 10.6 (H)   RBC     3.87 - 5.11 MIL/uL 4.14   Hemoglobin     12.0 - 15.0 g/dL 86.5   HCT     63.9 - 53.9 % 40.0   MCV     80.0 - 100.0 fL 96.6   MCH     26.0 - 34.0 pg 32.4   MCHC     30.0 - 36.0 g/dL 66.4   RDW     88.4 - 84.4 % 13.2   Platelets     150 - 400 K/uL 316   MPV     7.5 - 12.5 fL   Neutrophils     %   Lymphs     Not Estab. %   Monocytes     Not Estab. %   Eos     Not Estab. %   Basos     Not Estab. %   NEUT#     1.7 - 7.7 K/uL   Lymphs Abs     0.7 - 4.0 K/uL   Monocytes Absolute     0.1 - 0.9 x10E3/uL   EOS (ABSOLUTE)     0.0 - 0.4 x10E3/uL   Basophils Absolute     0.0 - 0.1 K/uL    Immature Granulocytes     %   Immature Grans (Abs)     0.0 - 0.1 x10E3/uL   nRBC     0.0 - 0.2 % 0.0   Lymphocytes     %   Monocytes Relative     %   Monocyte #     0.1 - 1.0 K/uL   Eosinophil     %   Eosinophils Absolute     0.0 - 0.5 K/uL   Basophil     %   Abs Immature Granulocytes     0.00 - 0.07 K/uL     Radiology/Studies:  DG Chest Port 1 View Result Date: 05/12/2024 EXAM: 1 VIEW XRAY OF THE CHEST 05/12/2024 09:45:00 PM COMPARISON: 02/16/2024 CLINICAL HISTORY: Shortness of breath. Pt recently d/c from hospital for diverticulitis Wednesday. Diarrhea resolved, now c/o emesis today x 1 hour. Reports chills and diaphoresis. C/o joint pain. FINDINGS: LINES, TUBES AND DEVICES: Defibrillator pads on left chest. Left subclavian approach biventricular pacer with leads in right atrium, right ventricle, and coronary sinus. LUNGS AND PLEURA: Increasing interstitial markings, favoring possible mild perihilar edema, although chronic. No focal pulmonary opacity. No pleural effusion. No pneumothorax. HEART AND MEDIASTINUM: Cardiomegaly. BONES AND SOFT TISSUES: Status post right shoulder arthroplasty. No acute osseous abnormality. IMPRESSION: 1. Possible mild perihilar edema, chronic. 2. Cardiomegaly. Electronically signed by: Pinkie Pebbles MD 05/12/2024 09:56 PM EDT RP Workstation: [id number]    Assessment and Plan:   Ms. Kizer presents  to the emergency department with ventricular tachycardia. She presents while currently being treated for acute diverticulitis. The differential for the inciting cause of her ventricular tachycardia includes her acute diverticulitis and prolonged QTc while taking ciprofloxacin .  Her QTc is prolonged to the setting of her left bundle branch block however no more prolonged than previously.  I reviewed the EGM's on her Medtronic device and and I did not see a PVC followed by a compensatory pause and a short coupled PVC. It could be that her diverticulitis  infection is the nidus and caused the VT recurrence, however she is at the end of her antibiotic therapy. Her last episode of VT was in November 2024 in the setting of hypomagnesia and hypokalemia. Additionally, left heart cath in 2021 obtained at [facility] revealed normal coronaries so ischemia is less likely.  Ventricular tachycardia s/p cardioversion  - Continue amiodarone  infusion - Continue home carvedilol  - Consider conversation with electrophysiology to discuss her antiarrhythmic regimen - Goal potassium > 4.5; magnesium  >2.5     Acute Diverticulitis: Continue ciprofloxacin  and metronidazole  for 1 more day to complete course Diabetes Mellitus: SSI Hypertension: Continue carvedilol . Hold isosorbide  mononitrate 30 mg as she is receovering from hypotension  HFrEF: JVP elevated.Lasix  40 mg in the AM. Hold home lasix  40 mg and spironolactone   Hypothyroidism:  Continue levothryoxine 112 mcg  GERD: Continue pantoprazole    Severity of Illness: The appropriate patient status for this patient is INPATIENT. Inpatient status is judged to be reasonable and necessary in order to provide the required intensity of service to ensure the patient's safety. The patient's presenting symptoms, physical exam findings, and initial radiographic and laboratory data in the context of their chronic comorbidities is felt to place them at high risk for further clinical deterioration. Furthermore, it is not anticipated that the patient will be medically stable for discharge from the hospital within 2 midnights of admission.   * I certify that at the point of admission it is my clinical judgment that the patient will require inpatient hospital care spanning beyond 2 midnights from the point of admission due to high intensity of service, high risk for further deterioration and high frequency of surveillance required.*   For questions or updates, please contact [facility] Please consult [email or web address] for  contact info under        Signed, Merlene JAYSON Blood, MD  05/12/2024 11:15 PM   Merlene Blood, MD MS  Cardiology [employer]

## 2024-05-12 NOTE — ED Notes (Signed)
 ED Provider at bedside.

## 2024-05-12 NOTE — ED Provider Notes (Signed)
 Emergency Department Provider Note   I have reviewed the triage vital signs and the nursing notes.   HISTORY  Chief Complaint Emesis   HPI Tanya Hogan is a 73 y.o. female past history of congestive heart failure (EF 25%) and NSVT on amiodarone  presents to the emergency department with abdominal pain, nausea/vomiting, and diaphoresis.  She was discharged from the hospital on Wednesday with diverticulitis and was feeling somewhat better at discharge.  Symptoms began this evening and were severe.  She has been taking her medications but began vomiting again at home this evening.  She denies any chest tightness, shortness of breath, palpitations.  No fevers.  No bloody diarrhea.  Past Medical History:  Diagnosis Date   Alkaline phosphatase elevation    Asthma    Breast cancer (HCC) 2011   Carpal tunnel syndrome    bilateral   Chronic HFrEF (heart failure with reduced ejection fraction) (HCC)    Cirrhosis, non-alcoholic (HCC)    Closed fracture of unspecified part of radius (alone)    DDD (degenerative disc disease), lumbar    Depression    pt denies   Diabetes mellitus without complication (HCC)    Enlarged heart    Fibromyalgia    GERD (gastroesophageal reflux disease)    History of kidney stones    HTN (hypertension)    Hypothyroidism    IBS (irritable bowel syndrome)    LBBB (left bundle branch block)    Microcytic anemia 05/14/2017   Mitral regurgitation    NICM (nonischemic cardiomyopathy) (HCC)    Pneumonia    Presence of permanent cardiac pacemaker    Pulmonary hypertension (HCC)    PVD (peripheral vascular disease) (HCC)    2019   Sleep apnea    uses cpap   Tricuspid regurgitation    Ventricular tachyarrhythmia (HCC)     Review of Systems  Constitutional: No fever/chills.  Positive lightheadedness. Cardiovascular: Denies chest pain. Respiratory: Denies shortness of breath. Gastrointestinal: Positive abdominal pain. Positive nausea and vomiting.  No  diarrhea.  No constipation. Skin: Negative for rash. Neurological: Negative for headaches.  ____________________________________________   PHYSICAL EXAM:  VITAL SIGNS: ED Triage Vitals  Encounter Vitals Group     BP 05/12/24 2119 103/75     Pulse Rate 05/12/24 2119 (!) 186     Resp 05/12/24 2119 16     Temp 05/12/24 2119 (!) 97.2 F (36.2 C)     Temp Source 05/12/24 2145 Oral     SpO2 05/12/24 2119 98 %     Weight --      Height 05/12/24 2119 5' 1 (1.549 m)   Constitutional: Alert and oriented.  Patient appears unwell. Eyes: Conjunctivae are normal.  Head: Atraumatic. Nose: No congestion/rhinnorhea. Mouth/Throat: Mucous membranes are moist.   Neck: No stridor.   Cardiovascular: Tachycardia with palpable pulse. Good peripheral circulation. Grossly normal heart sounds.   Respiratory: Increased respiratory effort.  No retractions. Lungs CTAB. Gastrointestinal: Soft and nontender. No distention.  Musculoskeletal: No gross deformities of extremities. Neurologic:  Normal speech and language.  Skin:  Skin is warm, dry and intact. No rash noted.  ____________________________________________   LABS (all labs ordered are listed, but only abnormal results are displayed)  Labs Reviewed  BASIC METABOLIC PANEL WITH GFR - Abnormal; Notable for the following components:      Result Value   CO2 19 (*)    Glucose, Bld 190 (*)    Creatinine, Ser 1.05 (*)    Calcium  11.0 (*)  GFR, Estimated 55 (*)    Anion gap 17 (*)    All other components within normal limits  CBC - Abnormal; Notable for the following components:   WBC 10.6 (*)    All other components within normal limits  LACTIC ACID, PLASMA - Abnormal; Notable for the following components:   Lactic Acid, Venous 2.9 (*)    All other components within normal limits  HEPATIC FUNCTION PANEL - Abnormal; Notable for the following components:   AST 48 (*)    Alkaline Phosphatase 165 (*)    Indirect Bilirubin 0.2 (*)    All  other components within normal limits  TROPONIN T, HIGH SENSITIVITY - Abnormal; Notable for the following components:   Troponin T High Sensitivity 44 (*)    All other components within normal limits  RESP PANEL BY RT-PCR (RSV, FLU A&B, COVID)  RVPGX2  CULTURE, BLOOD (ROUTINE X 2)  CULTURE, BLOOD (ROUTINE X 2)  MAGNESIUM   LIPASE, BLOOD  LACTIC ACID, PLASMA  URINALYSIS, W/ REFLEX TO CULTURE (INFECTION SUSPECTED)  TROPONIN T, HIGH SENSITIVITY   ____________________________________________  EKG   EKG Interpretation Date/Time:  Friday May 12 2024 21:26:53 EDT Ventricular Rate:  186 PR Interval:    QRS Duration:  188 QT Interval:  296 QTC Calculation: 521 R Axis:   118  Text Interpretation: Extreme tachycardia with wide complex, no further rhythm analysis attempted Confirmed by Darra Chew [phone]) on 05/12/2024 10:54:10 PM         ____________________________________________  RADIOLOGY  DG Chest Port 1 View Result Date: 05/12/2024 EXAM: 1 VIEW XRAY OF THE CHEST 05/12/2024 09:45:00 PM COMPARISON: 02/16/2024 CLINICAL HISTORY: Shortness of breath. Pt recently d/c from hospital for diverticulitis Wednesday. Diarrhea resolved, now c/o emesis today x 1 hour. Reports chills and diaphoresis. C/o joint pain. FINDINGS: LINES, TUBES AND DEVICES: Defibrillator pads on left chest. Left subclavian approach biventricular pacer with leads in right atrium, right ventricle, and coronary sinus. LUNGS AND PLEURA: Increasing interstitial markings, favoring possible mild perihilar edema, although chronic. No focal pulmonary opacity. No pleural effusion. No pneumothorax. HEART AND MEDIASTINUM: Cardiomegaly. BONES AND SOFT TISSUES: Status post right shoulder arthroplasty. No acute osseous abnormality. IMPRESSION: 1. Possible mild perihilar edema, chronic. 2. Cardiomegaly. Electronically signed by: Pinkie Pebbles MD 05/12/2024 09:56 PM EDT RP Workstation: [id number]     ____________________________________________   PROCEDURES  Procedure(s) performed:   .Critical Care  Performed by: Darra Chew MATSU, MD Authorized by: Darra Chew MATSU, MD   Critical care provider statement:    Critical care time (minutes):  75   Critical care time was exclusive of:  Separately billable procedures and treating other patients and teaching time   Critical care was necessary to treat or prevent imminent or life-threatening deterioration of the following conditions:  Circulatory failure   Critical care was time spent personally by me on the following activities:  Development of treatment plan with patient or surrogate, discussions with consultants, evaluation of patient's response to treatment, examination of patient, ordering and review of laboratory studies, ordering and review of radiographic studies, ordering and performing treatments and interventions, pulse oximetry, re-evaluation of patient's condition, review of old charts and obtaining history from patient or surrogate   I assumed direction of critical care for this patient from another provider in my specialty: no     Care discussed with: admitting provider   .Cardioversion  Date/Time: 05/12/2024 10:59 PM  Performed by: Darra Chew MATSU, MD Authorized by: Darra Chew MATSU, MD   Consent:  Consent obtained:  Emergent situation   Consent given by:  Patient   Risks discussed:  Cutaneous burn, death, induced arrhythmia and pain   Alternatives discussed:  Rate-control medication Pre-procedure details:    Cardioversion basis:  Emergent   Rhythm:  Ventricular tachycardia   Electrode placement:  Anterior-posterior Patient sedated: Yes. Refer to sedation procedure documentation for details of sedation.  Attempt one:    Cardioversion mode:  Synchronous   Waveform:  Biphasic   Shock (Joules):  200   Shock outcome:  Conversion to normal sinus rhythm Post-procedure details:    Patient status:  Awake   Patient  tolerance of procedure:  Tolerated well, no immediate complications .Sedation  Date/Time: 05/12/2024 11:00 PM  Performed by: Darra Fonda MATSU, MD Authorized by: Darra Fonda MATSU, MD   Consent:    Consent obtained:  Emergent situation   Consent given by:  Patient   Risks discussed:  Allergic reaction, nausea, inadequate sedation, dysrhythmia, prolonged hypoxia resulting in organ damage, prolonged sedation necessitating reversal, respiratory compromise necessitating ventilatory assistance and intubation and vomiting Universal protocol:    Immediately prior to procedure, a time out was called: yes     Patient identity confirmed:  Verbally with patient Indications:    Procedure performed:  Cardioversion Pre-sedation assessment:    Time since last food or drink:  5 hours   ASA classification: class 4 - patient with severe systemic disease that is a constant threat to life     Mallampati score:  II - soft palate, uvula, fauces visible   Neck mobility: normal     Pre-sedation assessments completed and reviewed: airway patency, cardiovascular function, hydration status, mental status, nausea/vomiting, pain level and respiratory function   A pre-sedation assessment was completed prior to the start of the procedure Immediate pre-procedure details:    Reassessment: Patient reassessed immediately prior to procedure     Reviewed: vital signs, relevant labs/tests and NPO status     Verified: bag valve mask available, emergency equipment available, intubation equipment available, IV patency confirmed, oxygen  available, reversal medications available and suction available   Procedure details (see MAR for exact dosages):    Preoxygenation:  Nasal cannula   Sedation:  Etomidate    Intended level of sedation: deep   Intra-procedure monitoring:  Blood pressure monitoring, cardiac monitor, continuous capnometry, continuous pulse oximetry, frequent LOC assessments and frequent vital sign checks   Intra-procedure  events: none     Total Provider sedation time (minutes):  15 Post-procedure details:   A post-sedation assessment was completed following the completion of the procedure.   Attendance: Constant attendance by certified staff until patient recovered     Recovery: Patient returned to pre-procedure baseline     Post-sedation assessments completed and reviewed: airway patency, cardiovascular function, hydration status, mental status, nausea/vomiting, pain level, respiratory function and temperature     Patient is stable for discharge or admission: yes     Procedure completion:  Tolerated well, no immediate complications    ____________________________________________   INITIAL IMPRESSION / ASSESSMENT AND PLAN / ED COURSE  Pertinent labs & imaging results that were available during my care of the patient were reviewed by me and considered in my medical decision making (see chart for details).   This patient is Presenting for Evaluation of arrhythmia, which does require a range of treatment options, and is a complaint that involves a high risk of morbidity and mortality.  The Differential Diagnoses include ventricular tachycardia, A-fib with aberrancy, SVT, etc.  Critical  Interventions-    Medications  amiodarone  (NEXTERONE  PREMIX) 360-4.14 MG/200ML-% (1.8 mg/mL) IV infusion (30 mg/hr Intravenous Rate/Dose Change 05/12/24 2220)    Followed by  amiodarone  (NEXTERONE  PREMIX) 360-4.14 MG/200ML-% (1.8 mg/mL) IV infusion (has no administration in time range)  amiodarone  (NEXTERONE ) 1.5 mg/mL IV bolus only 150 mg (0 mg Intravenous Hold 05/12/24 2208)  amiodarone  (NEXTERONE ) 1.8 mg/mL load via infusion 150 mg (150 mg Intravenous Bolus from Bag 05/12/24 2135)  etomidate  (AMIDATE ) injection 8 mg (5 mg Intravenous Given 05/12/24 2153)    Reassessment after intervention: Patient back in paced rhythm with improved blood pressure and symptoms.  I decided to review pertinent External Data, and in summary  followed by [facility] as outpatient.    Clinical Laboratory Tests Ordered, included minimally elevated lactic acid to 2.9.  Mild leukocytosis.  Normal potassium and magnesium .  Troponin minimally elevated to 44.  COVID-negative.  Radiologic Tests Ordered, included CXR. I independently interpreted the images and agree with radiology interpretation.   Cardiac Monitor Tracing which shows VT.    Consult complete with Cardiology, Dr. Mady.  No indication for Cath Lab activation.  Recommend cardiology fellow to admit to cardiac ICU.  I discussed this with the fellow on-call who agrees and I have placed temporary admit orders at the request.  Medical Decision Making: Summary:  The patient presents to the emergency department and arrives in ventricular tachycardia with a pulse.  Has a history of this in the past.  Suspect that perhaps she is not keeping down her amiodarone  due to vomiting at home.  Tried an initial amiodarone  bolus but the patient became more symptomatic with return of vomiting and noted that she was feeling worse.  At that time I discussed with the patient that we would moved to synchronized cardioversion.  She was given etomidate  as above and tolerated the procedure well.  She is now in a paced rhythm.   Reevaluation with update and discussion with patient.  She continues to feel well.  Will continue amiodarone  infusion.  She is awaiting transport to the ICU.  Patient's presentation is most consistent with acute presentation with potential threat to life or bodily function.   Disposition: admit  ____________________________________________  FINAL CLINICAL IMPRESSION(S) / ED DIAGNOSES  Final diagnoses:  V-tach [facility])    Note:  This document was prepared using Dragon voice recognition software and may include unintentional dictation errors.  Fonda Law, MD, [facility] Emergency Medicine    Oval Moralez, Fonda MATSU, MD 05/12/24 [phone]

## 2024-05-12 NOTE — ED Notes (Signed)
 [facility] called for transport.

## 2024-05-12 NOTE — ED Notes (Signed)
 Back in room with medtronic interogation device.  Same was completed post cardioversion at this time.

## 2024-05-13 ENCOUNTER — Inpatient Hospital Stay (HOSPITAL_COMMUNITY)

## 2024-05-13 DIAGNOSIS — Z1152 Encounter for screening for COVID-19: Secondary | ICD-10-CM | POA: Diagnosis not present

## 2024-05-13 DIAGNOSIS — Z79890 Hormone replacement therapy (postmenopausal): Secondary | ICD-10-CM | POA: Diagnosis not present

## 2024-05-13 DIAGNOSIS — E1151 Type 2 diabetes mellitus with diabetic peripheral angiopathy without gangrene: Secondary | ICD-10-CM | POA: Diagnosis not present

## 2024-05-13 DIAGNOSIS — I5022 Chronic systolic (congestive) heart failure: Secondary | ICD-10-CM | POA: Diagnosis not present

## 2024-05-13 DIAGNOSIS — Z950 Presence of cardiac pacemaker: Secondary | ICD-10-CM | POA: Diagnosis not present

## 2024-05-13 DIAGNOSIS — F419 Anxiety disorder, unspecified: Secondary | ICD-10-CM | POA: Diagnosis present

## 2024-05-13 DIAGNOSIS — I472 Ventricular tachycardia, unspecified: Secondary | ICD-10-CM | POA: Diagnosis not present

## 2024-05-13 DIAGNOSIS — Z96611 Presence of right artificial shoulder joint: Secondary | ICD-10-CM | POA: Diagnosis present

## 2024-05-13 DIAGNOSIS — E785 Hyperlipidemia, unspecified: Secondary | ICD-10-CM | POA: Diagnosis not present

## 2024-05-13 DIAGNOSIS — G4733 Obstructive sleep apnea (adult) (pediatric): Secondary | ICD-10-CM | POA: Diagnosis present

## 2024-05-13 DIAGNOSIS — I4892 Unspecified atrial flutter: Secondary | ICD-10-CM | POA: Diagnosis not present

## 2024-05-13 DIAGNOSIS — Z87891 Personal history of nicotine dependence: Secondary | ICD-10-CM | POA: Diagnosis not present

## 2024-05-13 DIAGNOSIS — I071 Rheumatic tricuspid insufficiency: Secondary | ICD-10-CM | POA: Diagnosis not present

## 2024-05-13 DIAGNOSIS — K5732 Diverticulitis of large intestine without perforation or abscess without bleeding: Secondary | ICD-10-CM | POA: Diagnosis not present

## 2024-05-13 DIAGNOSIS — I429 Cardiomyopathy, unspecified: Secondary | ICD-10-CM | POA: Diagnosis not present

## 2024-05-13 DIAGNOSIS — Z8249 Family history of ischemic heart disease and other diseases of the circulatory system: Secondary | ICD-10-CM | POA: Diagnosis not present

## 2024-05-13 DIAGNOSIS — E875 Hyperkalemia: Secondary | ICD-10-CM | POA: Diagnosis present

## 2024-05-13 DIAGNOSIS — I110 Hypertensive heart disease with heart failure: Secondary | ICD-10-CM | POA: Diagnosis not present

## 2024-05-13 DIAGNOSIS — Z7983 Long term (current) use of bisphosphonates: Secondary | ICD-10-CM | POA: Diagnosis not present

## 2024-05-13 DIAGNOSIS — E039 Hypothyroidism, unspecified: Secondary | ICD-10-CM | POA: Diagnosis not present

## 2024-05-13 DIAGNOSIS — I272 Pulmonary hypertension, unspecified: Secondary | ICD-10-CM | POA: Diagnosis not present

## 2024-05-13 DIAGNOSIS — Z79899 Other long term (current) drug therapy: Secondary | ICD-10-CM | POA: Diagnosis not present

## 2024-05-13 DIAGNOSIS — I4729 Other ventricular tachycardia: Secondary | ICD-10-CM | POA: Diagnosis not present

## 2024-05-13 DIAGNOSIS — K219 Gastro-esophageal reflux disease without esophagitis: Secondary | ICD-10-CM | POA: Diagnosis present

## 2024-05-13 DIAGNOSIS — M797 Fibromyalgia: Secondary | ICD-10-CM | POA: Diagnosis present

## 2024-05-13 DIAGNOSIS — I471 Supraventricular tachycardia, unspecified: Secondary | ICD-10-CM | POA: Diagnosis present

## 2024-05-13 DIAGNOSIS — I428 Other cardiomyopathies: Secondary | ICD-10-CM | POA: Diagnosis not present

## 2024-05-13 DIAGNOSIS — Z23 Encounter for immunization: Secondary | ICD-10-CM | POA: Diagnosis not present

## 2024-05-13 LAB — ECHOCARDIOGRAM COMPLETE
Area-P 1/2: 4.99 cm2
Calc EF: 25.1 %
Height: 61 in
S' Lateral: 5.5 cm
Single Plane A2C EF: 25.1 %
Single Plane A4C EF: 26.8 %
Weight: 3104.08 [oz_av]

## 2024-05-13 LAB — GLUCOSE, CAPILLARY
Glucose-Capillary: 117 mg/dL — ABNORMAL HIGH (ref 70–99)
Glucose-Capillary: 119 mg/dL — ABNORMAL HIGH (ref 70–99)
Glucose-Capillary: 121 mg/dL — ABNORMAL HIGH (ref 70–99)
Glucose-Capillary: 132 mg/dL — ABNORMAL HIGH (ref 70–99)
Glucose-Capillary: 144 mg/dL — ABNORMAL HIGH (ref 70–99)

## 2024-05-13 LAB — URINALYSIS, W/ REFLEX TO CULTURE (INFECTION SUSPECTED)
Bacteria, UA: NONE SEEN
Bilirubin Urine: NEGATIVE
Glucose, UA: NEGATIVE mg/dL
Hgb urine dipstick: NEGATIVE
Ketones, ur: NEGATIVE mg/dL
Leukocytes,Ua: NEGATIVE
Nitrite: NEGATIVE
Protein, ur: NEGATIVE mg/dL
Specific Gravity, Urine: 1.018 (ref 1.005–1.030)
pH: 5 (ref 5.0–8.0)

## 2024-05-13 LAB — BASIC METABOLIC PANEL WITH GFR
Anion gap: 14 (ref 5–15)
BUN: 16 mg/dL (ref 8–23)
CO2: 20 mmol/L — ABNORMAL LOW (ref 22–32)
Calcium: 9.9 mg/dL (ref 8.9–10.3)
Chloride: 104 mmol/L (ref 98–111)
Creatinine, Ser: 0.84 mg/dL (ref 0.44–1.00)
GFR, Estimated: >60 mL/min (ref >60)
Glucose, Bld: 146 mg/dL — ABNORMAL HIGH (ref 70–99)
Potassium: 4.1 mmol/L (ref 3.5–5.1)
Sodium: 138 mmol/L (ref 135–145)

## 2024-05-13 LAB — MAGNESIUM: Magnesium: 2.3 mg/dL (ref 1.7–2.4)

## 2024-05-13 LAB — CBC
HCT: 38.3 % (ref 36.0–46.0)
Hemoglobin: 12.5 g/dL (ref 12.0–15.0)
MCH: 32.2 pg (ref 26.0–34.0)
MCHC: 32.6 g/dL (ref 30.0–36.0)
MCV: 98.7 fL (ref 80.0–100.0)
Platelets: 247 K/uL (ref 150–400)
RBC: 3.88 MIL/uL (ref 3.87–5.11)
RDW: 13.2 % (ref 11.5–15.5)
WBC: 8.7 K/uL (ref 4.0–10.5)
nRBC: 0 % (ref 0.0–0.2)

## 2024-05-13 LAB — MRSA NEXT GEN BY PCR, NASAL: MRSA by PCR Next Gen: DETECTED — AB

## 2024-05-13 MED ORDER — PANTOPRAZOLE SODIUM 40 MG PO TBEC
40.0000 mg | DELAYED_RELEASE_TABLET | Freq: Every day | ORAL | Status: DC
  Administered 2024-05-13 – 2024-05-24 (×12): 40 mg via ORAL
  Filled 2024-05-13 (×12): qty 1

## 2024-05-13 MED ORDER — LORAZEPAM 2 MG/ML IJ SOLN
0.5000 mg | Freq: Four times a day (QID) | INTRAMUSCULAR | Status: DC | PRN
  Filled 2024-05-13: qty 1

## 2024-05-13 MED ORDER — LORAZEPAM 2 MG/ML IJ SOLN
0.5000 mg | Freq: Four times a day (QID) | INTRAMUSCULAR | Status: DC | PRN
  Administered 2024-05-13 – 2024-05-16 (×4): 0.5 mg via INTRAVENOUS
  Filled 2024-05-13 (×3): qty 1

## 2024-05-13 MED ORDER — INSULIN ASPART 100 UNIT/ML IJ SOLN: 0.0000 [IU] | Freq: Every day | INTRAMUSCULAR | Status: DC

## 2024-05-13 MED ORDER — CIPROFLOXACIN 500 MG/5ML (10%) PO SUSR: 500.0000 mg | Freq: Two times a day (BID) | ORAL | Status: DC

## 2024-05-13 MED ORDER — METRONIDAZOLE 500 MG PO TABS
500.0000 mg | ORAL_TABLET | Freq: Two times a day (BID) | ORAL | Status: AC
  Administered 2024-05-13 (×2): 500 mg via ORAL
  Filled 2024-05-13 (×2): qty 1

## 2024-05-13 MED ORDER — INFLUENZA VAC SPLIT HIGH-DOSE 0.5 ML IM SUSY
0.5000 mL | PREFILLED_SYRINGE | INTRAMUSCULAR | Status: AC
  Administered 2024-05-24: 0.5 mL via INTRAMUSCULAR
  Filled 2024-05-13 (×2): qty 0.5

## 2024-05-13 MED ORDER — CHLORHEXIDINE GLUCONATE CLOTH 2 % EX PADS
6.0000 | MEDICATED_PAD | Freq: Every day | CUTANEOUS | Status: DC
  Administered 2024-05-13 – 2024-05-17 (×5): 6 via TOPICAL

## 2024-05-13 MED ORDER — FLUTICASONE FUROATE-VILANTEROL 200-25 MCG/ACT IN AEPB
1.0000 | INHALATION_SPRAY | Freq: Every day | RESPIRATORY_TRACT | Status: DC
Start: 2024-05-13 — End: 2024-05-24
  Administered 2024-05-13 – 2024-05-24 (×11): 1 via RESPIRATORY_TRACT
  Filled 2024-05-13 (×2): qty 28

## 2024-05-13 MED ORDER — INSULIN ASPART 100 UNIT/ML IJ SOLN
0.0000 [IU] | Freq: Three times a day (TID) | INTRAMUSCULAR | Status: DC
  Administered 2024-05-21 – 2024-05-24 (×3): 1 [IU] via SUBCUTANEOUS

## 2024-05-13 MED ORDER — ASPIRIN 81 MG PO CHEW: 81.0000 mg | CHEWABLE_TABLET | Freq: Every day | ORAL | Status: DC

## 2024-05-13 MED ORDER — FUROSEMIDE 10 MG/ML IJ SOLN
40.0000 mg | Freq: Once | INTRAMUSCULAR | Status: AC
  Administered 2024-05-13: 40 mg via INTRAVENOUS
  Filled 2024-05-13: qty 4

## 2024-05-13 MED ORDER — PRAVASTATIN SODIUM 40 MG PO TABS
40.0000 mg | ORAL_TABLET | Freq: Every day | ORAL | Status: DC
  Administered 2024-05-13 – 2024-05-24 (×12): 40 mg via ORAL
  Filled 2024-05-13 (×12): qty 1

## 2024-05-13 MED ORDER — LEVOTHYROXINE SODIUM 112 MCG PO TABS
112.0000 ug | ORAL_TABLET | Freq: Every day | ORAL | Status: DC
Start: 2024-05-13 — End: 2024-05-24
  Administered 2024-05-13 – 2024-05-24 (×12): 112 ug via ORAL
  Filled 2024-05-13 (×14): qty 1

## 2024-05-13 MED ORDER — SODIUM CHLORIDE 0.9 % IV SOLN: 250.0000 mL | INTRAVENOUS | Status: AC | PRN

## 2024-05-13 MED ORDER — ACETAMINOPHEN 325 MG PO TABS
650.0000 mg | ORAL_TABLET | ORAL | Status: DC | PRN
  Administered 2024-05-13 – 2024-05-21 (×11): 650 mg via ORAL
  Filled 2024-05-13 (×11): qty 2

## 2024-05-13 MED ORDER — ORAL CARE MOUTH RINSE: 15.0000 mL | OROMUCOSAL | Status: DC | PRN

## 2024-05-13 MED ORDER — SPIRONOLACTONE 12.5 MG HALF TABLET
12.5000 mg | ORAL_TABLET | Freq: Every day | ORAL | Status: DC
Start: 2024-05-13 — End: 2024-05-15
  Administered 2024-05-13 – 2024-05-14 (×2): 12.5 mg via ORAL
  Filled 2024-05-13 (×2): qty 1

## 2024-05-13 MED ORDER — SODIUM CHLORIDE 0.9% FLUSH
3.0000 mL | Freq: Two times a day (BID) | INTRAVENOUS | Status: DC
  Administered 2024-05-13 – 2024-05-23 (×18): 3 mL via INTRAVENOUS

## 2024-05-13 MED ORDER — SODIUM CHLORIDE 0.9% FLUSH
3.0000 mL | INTRAVENOUS | Status: DC | PRN
  Administered 2024-05-16: 3 mL via INTRAVENOUS

## 2024-05-13 MED ORDER — MAGNESIUM SULFATE 2 GM/50ML IV SOLN
2.0000 g | Freq: Once | INTRAVENOUS | Status: AC
  Administered 2024-05-13: 2 g via INTRAVENOUS
  Filled 2024-05-13: qty 50

## 2024-05-13 MED ORDER — PERFLUTREN LIPID MICROSPHERE
1.0000 mL | INTRAVENOUS | Status: AC | PRN
  Administered 2024-05-13: 2 mL via INTRAVENOUS

## 2024-05-13 MED ORDER — CIPROFLOXACIN HCL 250 MG PO TABS
500.0000 mg | ORAL_TABLET | Freq: Two times a day (BID) | ORAL | Status: DC
  Administered 2024-05-13: 500 mg via ORAL
  Filled 2024-05-13 (×2): qty 2

## 2024-05-13 MED ORDER — CARVEDILOL 3.125 MG PO TABS
3.1250 mg | ORAL_TABLET | Freq: Two times a day (BID) | ORAL | Status: DC
Start: 2024-05-13 — End: 2024-05-23
  Administered 2024-05-13 – 2024-05-23 (×21): 3.125 mg via ORAL
  Filled 2024-05-13 (×21): qty 1

## 2024-05-13 MED ORDER — ENOXAPARIN SODIUM 40 MG/0.4ML IJ SOSY
40.0000 mg | PREFILLED_SYRINGE | Freq: Every day | INTRAMUSCULAR | Status: DC
  Administered 2024-05-13 – 2024-05-17 (×5): 40 mg via SUBCUTANEOUS
  Filled 2024-05-13 (×5): qty 0.4

## 2024-05-13 MED ORDER — LIDOCAINE 5 % EX PTCH
1.0000 | MEDICATED_PATCH | CUTANEOUS | Status: AC
  Administered 2024-05-13: 1 via TRANSDERMAL
  Filled 2024-05-13: qty 1

## 2024-05-13 NOTE — H&P (Signed)
 [facility] Progress Note   Patient ID: Tanya Hogan MRN: [id number]; DOB: [birth year]   Admission date: 05/12/2024  Primary Care Provider: [name], MD Primary Cardiologist: Redell Shallow, MD  Primary Electrophysiologist:  Danelle Birmingham, MD   Chief Complaint:  Emesis  Patient Profile:   LASANDRA Hogan is a 73 y.o. female with nonischemic cardiomyopathy with EF 20%, VT on amiodarone , history of complete heart block now s/p BiV pacemaker, hypertension, hyperlipidemia, diabetes  Interval hx:   - No further VT; however, ectopy on telemetry with short runs of NSVT (3-5 beats).  - Feels very fatigued.   Physical Exam/Data:   Vitals:   05/13/24 1130 05/13/24 1138 05/13/24 1200 05/13/24 1230  BP:    (!) 148/85  Pulse: 61 60 (!) 59 60  Resp: (!) 22 14 19  (!) 21  Temp:      TempSrc:      SpO2: 93% 92% 95% (!) 89%  Weight:      Height:        Intake/Output Summary (Last 24 hours) at 05/13/2024 1344 Last data filed at 05/13/2024 1206 Gross per 24 hour  Intake 551.03 ml  Output 800 ml  Net -248.97 ml   Filed Weights   05/13/24 0559  Weight: 88 kg   Vitals:   05/13/24 1200 05/13/24 1230  BP:  (!) 148/85  Pulse: (!) 59 60  Resp: 19 (!) 21  Temp:    SpO2: 95% (!) 89%   GENERAL: NAD Lungs- normal work of breathing CARDIAC:  JVP: 8 cm          Normal rate with regular rhythm. no murmur.  Pulses 2+. 1+ edema.  ABDOMEN: Soft, non-tender, non-distended.  EXTREMITIES: Warm and well perfused.  NEUROLOGIC: No obvious FND    EKG:   May 12, 2024 21:26 EKG ventricular tachycardia at 186 bpm May 13, 2019 25:20 1:56 PM atrial sensed ventricularly paced rhythm with left bundle branch block with more ST depression inferiorly and laterally when compared to her EKG on August 14, 2023 at 1902 there are no significant changes  Relevant CV Studies: May 2023 echocardiogram 1. Left ventricular ejection fraction, by estimation, is 20 to 25%. The  left ventricle has  severely decreased function. The left ventricle  demonstrates regional wall motion abnormalities (see scoring  diagram/findings for description). The left  ventricular internal cavity size was mildly to moderately dilated. Left  ventricular diastolic parameters are consistent with Grade I diastolic  dysfunction (impaired relaxation). Elevated left ventricular end-diastolic  pressure.   2. Trivial mitral valve regurgitation.   3. The aortic valve is tricuspid.   4. There is normal pulmonary artery systolic pressure.   Laboratory Data: Component     Latest Ref Rng 05/12/2024  Sodium     135 - 145 mmol/L 140   Potassium     3.5 - 5.1 mmol/L 4.3   Chloride     98 - 111 mmol/L 104   CO2     22 - 32 mmol/L 19 (L)   Glucose     70 - 99 mg/dL 809 (H)   BUN     8 - 23 mg/dL 17   Creatinine     9.55 - 1.00 mg/dL 8.94 (H)   Calcium      8.9 - 10.3 mg/dL 88.9 (H)   GFR, Estimated     >60 mL/min 55 (L)   Anion gap     5 - 15  17 (H)  Component     Latest Ref Rng 05/12/2024  WBC     4.0 - 10.5 K/uL 10.6 (H)   RBC     3.87 - 5.11 MIL/uL 4.14   Hemoglobin     12.0 - 15.0 g/dL 86.5   HCT     63.9 - 53.9 % 40.0   MCV     80.0 - 100.0 fL 96.6   MCH     26.0 - 34.0 pg 32.4   MCHC     30.0 - 36.0 g/dL 66.4   RDW     88.4 - 84.4 % 13.2   Platelets     150 - 400 K/uL 316   MPV     7.5 - 12.5 fL   Neutrophils     %   Lymphs     Not Estab. %   Monocytes     Not Estab. %   Eos     Not Estab. %   Basos     Not Estab. %   NEUT#     1.7 - 7.7 K/uL   Lymphs Abs     0.7 - 4.0 K/uL   Monocytes Absolute     0.1 - 0.9 x10E3/uL   EOS (ABSOLUTE)     0.0 - 0.4 x10E3/uL   Basophils Absolute     0.0 - 0.1 K/uL   Immature Granulocytes     %   Immature Grans (Abs)     0.0 - 0.1 x10E3/uL   nRBC     0.0 - 0.2 % 0.0   Lymphocytes     %   Monocytes Relative     %   Monocyte #     0.1 - 1.0 K/uL   Eosinophil     %   Eosinophils Absolute     0.0 - 0.5 K/uL   Basophil      %   Abs Immature Granulocytes     0.00 - 0.07 K/uL     Radiology/Studies:  DG Chest Port 1 View Result Date: 05/12/2024 EXAM: 1 VIEW XRAY OF THE CHEST 05/12/2024 09:45:00 PM COMPARISON: 02/16/2024 CLINICAL HISTORY: Shortness of breath. Pt recently d/c from hospital for diverticulitis Wednesday. Diarrhea resolved, now c/o emesis today x 1 hour. Reports chills and diaphoresis. C/o joint pain. FINDINGS: LINES, TUBES AND DEVICES: Defibrillator pads on left chest. Left subclavian approach biventricular pacer with leads in right atrium, right ventricle, and coronary sinus. LUNGS AND PLEURA: Increasing interstitial markings, favoring possible mild perihilar edema, although chronic. No focal pulmonary opacity. No pleural effusion. No pneumothorax. HEART AND MEDIASTINUM: Cardiomegaly. BONES AND SOFT TISSUES: Status post right shoulder arthroplasty. No acute osseous abnormality. IMPRESSION: 1. Possible mild perihilar edema, chronic. 2. Cardiomegaly. Electronically signed by: Pinkie Pebbles MD 05/12/2024 09:56 PM EDT RP Workstation: [id number]    Assessment and Plan:   Ms. Flemings presented to the emergency department with ventricular tachycardia. History notable for recent episode of diverticulitis being treated with ciprofloxacin  likely precipating prologned Qtc / VT. Left heart cath in 2021 obtained at [facility] revealed normal coronaries so ischemia is less likely.  Ventricular tachycardia s/p cardioversion  - Continue amiodarone  infusion; will plan to transition to PO tomorrow.  - Will involve EP on Monday - Currently pacing at 60BPM with some breakthrough ectopy. Will increase pacing to 70BPM.  - Discontinued ciprofloxacin  this AM.  - Euvolemic on exam; labs WNL.  - Continue home carvedilol  - Goal potassium > 4.5; magnesium  >2.5  Acute Diverticulitis: D/C ciprofloxacin ; completed course now.  Diabetes Mellitus: SSI Hypertension: Continue carvedilol . BP at goal today.  HFrEF: s/p IV lasix   40mg  x1. Euvolemic on exam.  Hypothyroidism:  Continue levothryoxine 112 mcg  GERD: Continue pantoprazole     Signed, Ria Commander, DO  05/13/2024 1:44 PM

## 2024-05-13 NOTE — Progress Notes (Signed)
 Asked to see the patient to increase base heart rate.  Patient presented to the hospital with ventricular tachycardia.  Base heart rate increased to 80 bpm.  If patient stabilizes, may need base heart rate reduced back to 60 at discharge.  Soyla Norton, MD

## 2024-05-14 DIAGNOSIS — I472 Ventricular tachycardia, unspecified: Secondary | ICD-10-CM | POA: Diagnosis not present

## 2024-05-14 LAB — BASIC METABOLIC PANEL WITH GFR
Anion gap: 8 (ref 5–15)
Anion gap: 11 (ref 5–15)
BUN: 12 mg/dL (ref 8–23)
BUN: 13 mg/dL (ref 8–23)
CO2: 23 mmol/L (ref 22–32)
CO2: 23 mmol/L (ref 22–32)
Calcium: 9.4 mg/dL (ref 8.9–10.3)
Calcium: 9.1 mg/dL (ref 8.9–10.3)
Chloride: 105 mmol/L (ref 98–111)
Chloride: 103 mmol/L (ref 98–111)
Creatinine, Ser: 0.88 mg/dL (ref 0.44–1.00)
Creatinine, Ser: 1.17 mg/dL — ABNORMAL HIGH (ref 0.44–1.00)
GFR, Estimated: >60 mL/min (ref >60)
GFR, Estimated: 49 mL/min — ABNORMAL LOW (ref >60)
Glucose, Bld: 136 mg/dL — ABNORMAL HIGH (ref 70–99)
Glucose, Bld: 145 mg/dL — ABNORMAL HIGH (ref 70–99)
Potassium: 3.8 mmol/L (ref 3.5–5.1)
Potassium: 3.6 mmol/L (ref 3.5–5.1)
Sodium: 136 mmol/L (ref 135–145)
Sodium: 137 mmol/L (ref 135–145)

## 2024-05-14 LAB — GLUCOSE, CAPILLARY
Glucose-Capillary: 139 mg/dL — ABNORMAL HIGH (ref 70–99)
Glucose-Capillary: 111 mg/dL — ABNORMAL HIGH (ref 70–99)
Glucose-Capillary: 139 mg/dL — ABNORMAL HIGH (ref 70–99)
Glucose-Capillary: 135 mg/dL — ABNORMAL HIGH (ref 70–99)

## 2024-05-14 LAB — CBC
HCT: 39.5 % (ref 36.0–46.0)
Hemoglobin: 12.8 g/dL (ref 12.0–15.0)
MCH: 31.8 pg (ref 26.0–34.0)
MCHC: 32.4 g/dL (ref 30.0–36.0)
MCV: 98 fL (ref 80.0–100.0)
Platelets: 265 K/uL (ref 150–400)
RBC: 4.03 MIL/uL (ref 3.87–5.11)
RDW: 13.2 % (ref 11.5–15.5)
WBC: 10.1 K/uL (ref 4.0–10.5)
nRBC: 0 % (ref 0.0–0.2)

## 2024-05-14 LAB — MAGNESIUM: Magnesium: 2.6 mg/dL — ABNORMAL HIGH (ref 1.7–2.4)

## 2024-05-14 MED ORDER — AMIODARONE HCL IN DEXTROSE 360-4.14 MG/200ML-% IV SOLN
30.0000 mg/h | INTRAVENOUS | Status: DC
  Administered 2024-05-14 – 2024-05-15 (×5): 30 mg/h via INTRAVENOUS
  Filled 2024-05-14 (×2): qty 200

## 2024-05-14 MED ORDER — AMIODARONE LOAD VIA INFUSION: 150.0000 mg | Freq: Once | INTRAVENOUS | Status: DC

## 2024-05-14 MED ORDER — HYDRALAZINE HCL 10 MG PO TABS
10.0000 mg | ORAL_TABLET | Freq: Three times a day (TID) | ORAL | Status: DC
  Administered 2024-05-14 – 2024-05-16 (×6): 10 mg via ORAL
  Filled 2024-05-14 (×6): qty 1

## 2024-05-14 MED ORDER — AMIODARONE LOAD VIA INFUSION
150.0000 mg | Freq: Once | INTRAVENOUS | Status: AC
Start: 2024-05-14 — End: 2024-05-14
  Administered 2024-05-14: 150 mg via INTRAVENOUS
  Filled 2024-05-14: qty 83.34

## 2024-05-14 MED ORDER — AMIODARONE HCL IN DEXTROSE 360-4.14 MG/200ML-% IV SOLN
60.0000 mg/h | INTRAVENOUS | Status: AC
  Administered 2024-05-14: 60 mg/h via INTRAVENOUS
  Filled 2024-05-14: qty 400

## 2024-05-14 MED ORDER — POTASSIUM CHLORIDE CRYS ER 20 MEQ PO TBCR
20.0000 meq | EXTENDED_RELEASE_TABLET | Freq: Once | ORAL | Status: AC
  Administered 2024-05-14: 20 meq via ORAL
  Filled 2024-05-14: qty 1

## 2024-05-14 MED ORDER — LIDOCAINE HCL (CARDIAC) PF 100 MG/5ML IV SOSY
PREFILLED_SYRINGE | INTRAVENOUS | Status: AC
  Administered 2024-05-14: 100 mg
  Filled 2024-05-14: qty 5

## 2024-05-14 MED ORDER — ISOSORBIDE MONONITRATE ER 30 MG PO TB24
30.0000 mg | ORAL_TABLET | Freq: Every day | ORAL | Status: DC
Start: 2024-05-14 — End: 2024-05-24
  Administered 2024-05-14 – 2024-05-24 (×11): 30 mg via ORAL
  Filled 2024-05-14 (×11): qty 1

## 2024-05-14 MED ORDER — MAGNESIUM SULFATE 2 GM/50ML IV SOLN
2.0000 g | Freq: Once | INTRAVENOUS | Status: AC
  Administered 2024-05-14: 2 g via INTRAVENOUS

## 2024-05-14 MED ORDER — ALBUTEROL SULFATE (2.5 MG/3ML) 0.083% IN NEBU
2.5000 mg | INHALATION_SOLUTION | Freq: Four times a day (QID) | RESPIRATORY_TRACT | Status: DC | PRN
  Administered 2024-05-16 – 2024-05-23 (×6): 2.5 mg via RESPIRATORY_TRACT
  Filled 2024-05-14 (×8): qty 3

## 2024-05-14 MED ORDER — FUROSEMIDE 40 MG PO TABS
40.0000 mg | ORAL_TABLET | Freq: Every day | ORAL | Status: DC
  Administered 2024-05-14 – 2024-05-18 (×5): 40 mg via ORAL
  Filled 2024-05-14 (×5): qty 1

## 2024-05-14 NOTE — Progress Notes (Signed)
   05/14/24 1300  Spiritual Encounters  Type of Visit Initial  Care provided to: Patient  Referral source Nurse (RN/NT/LPN)  Reason for visit Routine spiritual support  OnCall Visit Yes    Chaplain was paged for prayer. The patient shared her relationship with her daughter and brother. She informed that she is worried about her brother due to his lack of ability. She expressed a desire to overcome the illness but felt she lacked the strength in her body to fight.  Chaplain listened to her attentively, normalized the emotions, provided support and offered a prayer. Chaplain will bring a Bible upon patient's request.    Zachary Susanna Kerry Resident [phone]

## 2024-05-14 NOTE — Progress Notes (Signed)
   05/14/24 1700  Spiritual Encounters  Type of Visit Follow up    Chaplain made a follow up visit. Chaplain brought her a Bible upon her request. She expressed some feelings, emotions and worries about her brother, daughter and son. Chaplain reduced anxiety, provided comfort and offered a prayer.     M.Kubra Susanna Kerry Resident [phone]

## 2024-05-14 NOTE — Plan of Care (Signed)

## 2024-05-14 NOTE — H&P (Signed)
 AHF Progress Note   Patient ID: Tanya Hogan MRN: [id number]; DOB: Jul 24, 1951   Admission date: 05/12/2024  Primary Care Provider: [name], MD Primary Cardiologist: Redell Shallow, MD  Primary Electrophysiologist:  Danelle Birmingham, MD   Chief Complaint:  Emesis  Patient Profile:   Tanya Hogan is a 73 y.o. female with nonischemic cardiomyopathy with EF 20%, VT on amiodarone , history of complete heart block now s/p BiV pacemaker, hypertension, hyperlipidemia, diabetes  Interval hx:   - Recurrent NSVT overnight.   Physical Exam/Data:   Vitals:   05/14/24 0800 05/14/24 0900 05/14/24 1000 05/14/24 1100  BP: 112/80 121/68 124/73 129/74  Pulse: 84 82 80 81  Resp: 19 (!) 27 (!) 35 (!) 22  Temp:    (!) 97 F (36.1 C)  TempSrc:    Axillary  SpO2: 96% (!) 84% 98% 94%  Weight:      Height:        Intake/Output Summary (Last 24 hours) at 05/14/2024 1136 Last data filed at 05/14/2024 1100 Gross per 24 hour  Intake 778.43 ml  Output 950 ml  Net -171.57 ml   Filed Weights   05/13/24 0559 05/14/24 0500  Weight: 88 kg 88 kg   Vitals:   05/14/24 1000 05/14/24 1100  BP: 124/73 129/74  Pulse: 80 81  Resp: (!) 35 (!) 22  Temp:  (!) 97 F (36.1 C)  SpO2: 98% 94%   GENERAL: NAD Lungs- normal work of breathing CARDIAC:  JVP: 7 cm          Normal rate with regular rhythm. no murmur.  Pulses 2+. No edema.  ABDOMEN: Soft, non-tender, non-distended.  EXTREMITIES: Warm and well perfused.  NEUROLOGIC: No obvious FND     EKG:   May 12, 2024 21:26 EKG ventricular tachycardia at 186 bpm May 13, 2019 25:20 1:56 PM atrial sensed ventricularly paced rhythm with left bundle branch block with more ST depression inferiorly and laterally when compared to her EKG on August 14, 2023 at 1902 there are no significant changes  Relevant CV Studies: May 2023 echocardiogram 1. Left ventricular ejection fraction, by estimation, is 20 to 25%. The  left ventricle has  severely decreased function. The left ventricle  demonstrates regional wall motion abnormalities (see scoring  diagram/findings for description). The left  ventricular internal cavity size was mildly to moderately dilated. Left  ventricular diastolic parameters are consistent with Grade I diastolic  dysfunction (impaired relaxation). Elevated left ventricular end-diastolic  pressure.   2. Trivial mitral valve regurgitation.   3. The aortic valve is tricuspid.   4. There is normal pulmonary artery systolic pressure.   Laboratory Data: Component     Latest Ref Rng 05/12/2024  Sodium     135 - 145 mmol/L 140   Potassium     3.5 - 5.1 mmol/L 4.3   Chloride     98 - 111 mmol/L 104   CO2     22 - 32 mmol/L 19 (L)   Glucose     70 - 99 mg/dL 809 (H)   BUN     8 - 23 mg/dL 17   Creatinine     9.55 - 1.00 mg/dL 8.94 (H)   Calcium      8.9 - 10.3 mg/dL 88.9 (H)   GFR, Estimated     >60 mL/min 55 (L)   Anion gap     5 - 15  17 (H)     Component     Latest  Ref Rng 05/12/2024  WBC     4.0 - 10.5 K/uL 10.6 (H)   RBC     3.87 - 5.11 MIL/uL 4.14   Hemoglobin     12.0 - 15.0 g/dL 86.5   HCT     63.9 - 53.9 % 40.0   MCV     80.0 - 100.0 fL 96.6   MCH     26.0 - 34.0 pg 32.4   MCHC     30.0 - 36.0 g/dL 66.4   RDW     88.4 - 84.4 % 13.2   Platelets     150 - 400 K/uL 316   MPV     7.5 - 12.5 fL   Neutrophils     %   Lymphs     Not Estab. %   Monocytes     Not Estab. %   Eos     Not Estab. %   Basos     Not Estab. %   NEUT#     1.7 - 7.7 K/uL   Lymphs Abs     0.7 - 4.0 K/uL   Monocytes Absolute     0.1 - 0.9 x10E3/uL   EOS (ABSOLUTE)     0.0 - 0.4 x10E3/uL   Basophils Absolute     0.0 - 0.1 K/uL   Immature Granulocytes     %   Immature Grans (Abs)     0.0 - 0.1 x10E3/uL   nRBC     0.0 - 0.2 % 0.0   Lymphocytes     %   Monocytes Relative     %   Monocyte #     0.1 - 1.0 K/uL   Eosinophil     %   Eosinophils Absolute     0.0 - 0.5 K/uL   Basophil      %   Abs Immature Granulocytes     0.00 - 0.07 K/uL     Radiology/Studies:  ECHOCARDIOGRAM COMPLETE Result Date: 05/13/2024    ECHOCARDIOGRAM REPORT   Patient Name:   Tanya Hogan Date of Exam: 05/13/2024 Medical Rec #:  [id number]       Height:       61.0 in Accession #:    [id number]      Weight:       194.0 lb Date of Birth:  01-Jun-1951        BSA:          1.864 m Patient Age:    73 years        BP:           101/71 mmHg Patient Gender: F               HR:           80 bpm. Exam Location:  Inpatient Procedure: 2D Echo and Intracardiac Opacification Agent (Both Spectral and Color            Flow Doppler were utilized during procedure). Indications:    Atrial Flutter  History:        Patient has prior history of Echocardiogram examinations. CHF;                 Risk Factors:Hypertension.  Sonographer:    Charmaine Gaskins Referring Phys: [id number] NKIRU C OSUDE IMPRESSIONS  1. No apical thrombus with Definity  contrast. Left ventricular ejection fraction, by estimation, is 20 to 25%. Left ventricular ejection fraction by 2D MOD biplane is 25.1 %.  The left ventricle has severely decreased function. The left ventricle demonstrates global hypokinesis. The left ventricular internal cavity size was moderately dilated. Left ventricular diastolic parameters are consistent with Grade II diastolic dysfunction (pseudonormalization). Elevated left ventricular end-diastolic pressure.  2. Right ventricular systolic function is normal. The right ventricular size is moderately enlarged. There is severely elevated pulmonary artery systolic pressure. The estimated right ventricular systolic pressure is 64.6 mmHg.  3. Left atrial size was severely dilated.  4. Right atrial size was severely dilated.  5. The mitral valve is degenerative. Mild mitral valve regurgitation. Moderate mitral annular calcification.  6. The tricuspid valve is abnormal. Tricuspid valve regurgitation is mild to moderate.  7. The aortic valve is tricuspid.  Aortic valve regurgitation is not visualized. Aortic valve sclerosis/calcification is present, without any evidence of aortic stenosis.  8. The inferior vena cava is dilated in size with <50% respiratory variability, suggesting right atrial pressure of 15 mmHg. Comparison(s): Changes from prior study are noted. 10/30/2022: LVEF 20-25%, global HK. FINDINGS  Left Ventricle: No apical thrombus with Definity  contrast. Left ventricular ejection fraction, by estimation, is 20 to 25%. Left ventricular ejection fraction by 2D MOD biplane is 25.1 %. The left ventricle has severely decreased function. The left ventricle demonstrates global hypokinesis. Definity  contrast agent was given IV to delineate the left ventricular endocardial borders. The left ventricular internal cavity size was moderately dilated. There is no left ventricular hypertrophy. Left ventricular diastolic parameters are consistent with Grade II diastolic dysfunction (pseudonormalization). Elevated left ventricular end-diastolic pressure. Right Ventricle: The right ventricular size is moderately enlarged. No increase in right ventricular wall thickness. Right ventricular systolic function is normal. There is severely elevated pulmonary artery systolic pressure. The tricuspid regurgitant velocity is 3.52 m/s, and with an assumed right atrial pressure of 15 mmHg, the estimated right ventricular systolic pressure is 64.6 mmHg. Left Atrium: Left atrial size was severely dilated. Right Atrium: Right atrial size was severely dilated. Pericardium: There is no evidence of pericardial effusion. Mitral Valve: The mitral valve is degenerative in appearance. There is mild calcification of the posterior mitral valve leaflet(s). Moderate mitral annular calcification. Mild mitral valve regurgitation. Tricuspid Valve: The tricuspid valve is abnormal. Tricuspid valve regurgitation is mild to moderate. Aortic Valve: The aortic valve is tricuspid. Aortic valve regurgitation is  not visualized. Aortic valve sclerosis/calcification is present, without any evidence of aortic stenosis. Pulmonic Valve: The pulmonic valve was grossly normal. Pulmonic valve regurgitation is trivial. Aorta: The aortic root and ascending aorta are structurally normal, with no evidence of dilitation. Venous: The inferior vena cava is dilated in size with less than 50% respiratory variability, suggesting right atrial pressure of 15 mmHg. IAS/Shunts: No atrial level shunt detected by color flow Doppler. Additional Comments: A device lead is visualized.  LEFT VENTRICLE PLAX 2D                        Biplane EF (MOD) LVIDd:         6.20 cm         LV Biplane EF:   Left LVIDs:         5.50 cm                          ventricular LV PW:         1.00 cm  ejection LV IVS:        0.90 cm                          fraction by LVOT diam:     2.10 cm                          2D MOD LV SV:         76                               biplane is LV SV Index:   41                               25.1 %. LVOT Area:     3.46 cm                                Diastology                                LV e' medial:    3.08 cm/s LV Volumes (MOD)               LV E/e' medial:  28.6 LV vol d, MOD    287.0 ml      LV e' lateral:   5.51 cm/s A2C:                           LV E/e' lateral: 16.0 LV vol d, MOD    269.0 ml A4C: LV vol s, MOD    215.0 ml A2C: LV vol s, MOD    197.0 ml A4C: LV SV MOD A2C:   72.0 ml LV SV MOD A4C:   269.0 ml LV SV MOD BP:    69.3 ml RIGHT VENTRICLE RV Basal diam:  3.90 cm RV Mid diam:    4.00 cm RV S prime:     14.50 cm/s LEFT ATRIUM              Index        RIGHT ATRIUM           Index LA diam:        5.00 cm  2.68 cm/m   RA Area:     26.00 cm LA Vol (A2C):   134.0 ml 71.87 ml/m  RA Volume:   91.80 ml  49.24 ml/m LA Vol (A4C):   122.0 ml 65.43 ml/m LA Biplane Vol: 135.0 ml 72.41 ml/m  AORTIC VALVE LVOT Vmax:   111.00 cm/s LVOT Vmean:  84.800 cm/s LVOT VTI:    0.220 m  AORTA Ao Asc diam:  3.00 cm MITRAL VALVE                TRICUSPID VALVE MV Area (PHT): 4.99 cm     TR Peak grad:   49.6 mmHg MV Decel Time: 152 msec     TR Vmax:        352.00 cm/s MV E velocity: 88.00 cm/s MV A velocity: 115.00 cm/s  SHUNTS MV E/A ratio:  0.77         Systemic VTI:  0.22 m  Systemic Diam: 2.10 cm Vinie Maxcy MD Electronically signed by Vinie Maxcy MD Signature Date/Time: 05/13/2024/3:28:01 PM    Final     Assessment and Plan:   Ms. Steedley presented to the emergency department with ventricular tachycardia. History notable for recent episode of diverticulitis being treated with ciprofloxacin  likely precipating prologned Qtc / VT. Left heart cath in 2021 obtained at [facility] revealed normal coronaries.  Ventricular tachycardia s/p cardioversion  - Discussed case with EP. Restarted IV amio gtt today with bolus due to recurrent VT. Will plan on device upgrade with ICD lead.  - Increased rate to 80BPM due to frequent breakthrough ectopy.  - Cipro  discontinued. Corrected Qtc WNL.  - Euvolemic to mildly hypervolemic on exam this AM; sCr 0.88. Hgb 12.8.   Diverticulitis - Completed antibiotic course; no complaints. Nontender abdomen. WBC ct 10.1.  Diabetes Mellitus:  -SSI Hypertension:  -Continue carvedilol . BP at goal today.  HFrEF:  - TTE 10/30/23 w/ LVEF 20-25%, moderately reduced RV function - TTE on 05/13/24 with LVEF 20-25% - Euvolemic on exam;  - GDMT limited by extensive allergy list (28 allergies).  - Restart home hydralazine  10mg  TID. Restart imdur  tomorrow.  - Start lasix  40mg  PO daily.  Hypothyroidism:   - Continue levothryoxine 112 mcg  GERD: -Continue pantoprazole     Signed, Ria Commander, DO  05/14/2024 11:36 AM

## 2024-05-14 NOTE — Progress Notes (Signed)
   05/14/24 2137  BiPAP/CPAP/SIPAP  BiPAP/CPAP/SIPAP Pt Type Adult  BiPAP/CPAP/SIPAP DREAMSTATIOND  Mask Size Medium  Respiratory Rate 29 breaths/min  IPAP 20 cmH20  EPAP 5 cmH2O  Flow Rate 2 lpm  Patient Home Machine No  Patient Home Mask No  Patient Home Tubing No  Auto Titrate No  BiPAP/CPAP /SiPAP Vitals  Pulse Rate 80  Resp (!) 29  SpO2 96 %  Bilateral Breath Sounds Clear;Diminished  MEWS Score/Color  MEWS Score 2  MEWS Score Color Yellow

## 2024-05-15 ENCOUNTER — Other Ambulatory Visit (HOSPITAL_COMMUNITY): Payer: Self-pay

## 2024-05-15 ENCOUNTER — Telehealth (HOSPITAL_COMMUNITY): Payer: Self-pay | Admitting: Pharmacy Technician

## 2024-05-15 DIAGNOSIS — I472 Ventricular tachycardia, unspecified: Secondary | ICD-10-CM | POA: Diagnosis not present

## 2024-05-15 LAB — CBC
HCT: 38.4 % (ref 36.0–46.0)
Hemoglobin: 12.6 g/dL (ref 12.0–15.0)
MCH: 32.1 pg (ref 26.0–34.0)
MCHC: 32.8 g/dL (ref 30.0–36.0)
MCV: 97.7 fL (ref 80.0–100.0)
Platelets: 259 K/uL (ref 150–400)
RBC: 3.93 MIL/uL (ref 3.87–5.11)
RDW: 13.2 % (ref 11.5–15.5)
WBC: 7.5 K/uL (ref 4.0–10.5)
nRBC: 0 % (ref 0.0–0.2)

## 2024-05-15 LAB — BASIC METABOLIC PANEL WITH GFR
Anion gap: 9 (ref 5–15)
BUN: 10 mg/dL (ref 8–23)
CO2: 24 mmol/L (ref 22–32)
Calcium: 8.9 mg/dL (ref 8.9–10.3)
Chloride: 105 mmol/L (ref 98–111)
Creatinine, Ser: 0.84 mg/dL (ref 0.44–1.00)
GFR, Estimated: >60 mL/min (ref >60)
Glucose, Bld: 119 mg/dL — ABNORMAL HIGH (ref 70–99)
Potassium: 3.7 mmol/L (ref 3.5–5.1)
Sodium: 138 mmol/L (ref 135–145)

## 2024-05-15 LAB — GLUCOSE, CAPILLARY
Glucose-Capillary: 117 mg/dL — ABNORMAL HIGH (ref 70–99)
Glucose-Capillary: 104 mg/dL — ABNORMAL HIGH (ref 70–99)
Glucose-Capillary: 113 mg/dL — ABNORMAL HIGH (ref 70–99)
Glucose-Capillary: 136 mg/dL — ABNORMAL HIGH (ref 70–99)

## 2024-05-15 MED ORDER — SPIRONOLACTONE 25 MG PO TABS
25.0000 mg | ORAL_TABLET | Freq: Every day | ORAL | Status: DC
  Administered 2024-05-15 – 2024-05-23 (×9): 25 mg via ORAL
  Filled 2024-05-15 (×10): qty 1

## 2024-05-15 MED ORDER — POTASSIUM CHLORIDE CRYS ER 20 MEQ PO TBCR
40.0000 meq | EXTENDED_RELEASE_TABLET | Freq: Once | ORAL | Status: AC
  Administered 2024-05-15: 40 meq via ORAL
  Filled 2024-05-15: qty 2

## 2024-05-15 NOTE — Progress Notes (Signed)
 Pt. Is on bipap, tolerating well. Placed on by RN.

## 2024-05-15 NOTE — Progress Notes (Signed)
 Heart Failure Navigator Progress Note  Assessed for Heart & Vascular TOC clinic readiness.  Patient does not meet criteria due to Advanced Heart Failure Team patient of Dr. Gasper Lloyd.   Navigator will sign off at this time.   Rhae Hammock, BSN, Scientist, clinical (histocompatibility and immunogenetics) Only

## 2024-05-15 NOTE — TOC Progression Note (Addendum)
 Transition of Care [facility]) - Progression Note    Patient Details  Name: Tanya Hogan MRN: [id number] Date of Birth: [birth year]  [facility] of [facility] CM/SW Contact  Isaiah Public, LCSWA Phone Number: 05/15/2024, 4:00 PM  Clinical Narrative:     CSW received consult for patient. 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 Patient reports her brother has good support system currently while she is in the hospital. Patient reports her daughter recently arranged for home aide to come out to their home. Patient informed CSW that patients son checks in on him in the evenings. CSW offered patient community resources and [employer]. Patient accepted. CSW offered patient care patrol resources to help out with any questions regarding placement for her brother if needed. Patient accepted resource for care patrol. All questions answered. No further questions reported at this time.  Expected Discharge Plan: Home w Home Health Services Barriers to Discharge: Continued Medical Work up               Expected Discharge Plan and Services In-house Referral: Clinical Social Work Discharge Planning Services: CM Consult Post Acute Care Choice: Home Health                     DME Agency: NA                   Social Drivers of Health (SDOH) Interventions SDOH Screenings   Food Insecurity: No Food Insecurity (05/13/2024)  Housing: Low Risk  (05/13/2024)  Transportation Needs: No Transportation Needs (05/13/2024)  Utilities: Not At Risk (05/13/2024)  Alcohol Screen: Low Risk  (08/10/2023)  Depression (PHQ2-9): Low Risk  (02/18/2024)  Financial Resource Strain: Medium Risk (08/10/2023)  Physical Activity: Sufficiently Active (08/10/2023)  Recent Concern: Physical Activity - Insufficiently Active (07/12/2023)  Social Connections: Moderately Integrated (05/13/2024)  Stress: No Stress Concern Present (05/08/2024)   Received from [facility]  Tobacco Use: Medium Risk (05/12/2024)  Health Literacy: Adequate Health Literacy (08/10/2023)    Readmission Risk Interventions     No data to display

## 2024-05-15 NOTE — Plan of Care (Signed)
 Patient on amiodarone  at 30mg /hr for VT storm. No VT since 1740 05/14/2024. EP in to see patient today and patient updated on plan of care. All questions answered and support given.

## 2024-05-15 NOTE — Telephone Encounter (Signed)
 Patient Product/process development scientist completed.    The patient is insured through [facility]. Patient has Medicare and is not eligible for a copay card, but may be able to apply for patient assistance or Medicare RX Payment Plan (Patient Must reach out to their plan, if eligible for payment plan), if available.    Ran test claim for Farxiga  10 mg and the current 30 day co-pay is $0.00.   This test claim was processed through [facility]- copay amounts may vary at other pharmacies due to pharmacy/plan contracts, or as the patient moves through the different stages of their insurance plan.     Reyes Sharps, CPHT Pharmacy Technician III Certified Patient Advocate [facility] Patient Advocate Team Direct Number: [phone]  Fax: [phone]

## 2024-05-15 NOTE — Progress Notes (Addendum)
 Advanced Heart Failure Rounding Note  Cardiologist: Redell Shallow, MD HF Cardiologist: Dr. Gardenia   Chief Complaint: VT  Subjective:    Had 2 runs of sustained VT starting around 5:45 PM last night (given 150 mg amiodarone  bolus), 1 NSVT run around 2 am.   Remains on amiodarone  gtt at 30/hr.     Objective:   Weight Range: 86.7 kg Body mass index is 36.12 kg/m.   Vital Signs:   Temp:  [97 F (36.1 C)-97.8 F (36.6 C)] 97.8 F (36.6 C) (09/22 0400) Pulse Rate:  [75-172] 80 (09/22 0500) Resp:  [14-41] 27 (09/22 0500) BP: (105-144)/(54-118) 126/75 (09/22 0616) SpO2:  [84 %-99 %] 94 % (09/22 0500) Weight:  [86.7 kg] 86.7 kg (09/22 0400) Last BM Date : 05/14/24  Weight change: Filed Weights   05/13/24 0559 05/14/24 0500 05/15/24 0400  Weight: 88 kg 88 kg 86.7 kg    Intake/Output:   Intake/Output Summary (Last 24 hours) at 05/15/2024 0705 Last data filed at 05/15/2024 0500 Gross per 24 hour  Intake 420.62 ml  Output 2050 ml  Net -1629.38 ml      Physical Exam    General:  Chronically ill appearing elderly female Neck: Jvp 6-7 Cor: Regular rate & rhythm. No murmurs. Lungs: Clear Abdomen: obese, soft, nontender, nondistended.  Extremities: No edema Neuro: Alert & oriented X 3. Affect pleasant   Telemetry   AV paced 80, 21 beat run NSVT around 2 am  Labs    CBC Recent Labs    05/14/24 0246 05/15/24 0247  WBC 10.1 7.5  HGB 12.8 12.6  HCT 39.5 38.4  MCV 98.0 97.7  PLT 265 259   Basic Metabolic Panel Recent Labs    90/79/74 0425 05/14/24 0246 05/14/24 1827 05/15/24 0247  NA  --    < > 137 138  K  --    < > 3.6 3.7  CL  --    < > 103 105  CO2  --    < > 23 24  GLUCOSE  --    < > 145* 119*  BUN  --    < > 13 10  CREATININE  --    < > 1.17* 0.84  CALCIUM   --    < > 9.1 8.9  MG 2.3  --  2.6*  --    < > = values in this interval not displayed.   Liver Function Tests Recent Labs    05/12/24 2137  AST 48*  ALT 32  ALKPHOS 165*   BILITOT 0.4  PROT 7.1  ALBUMIN 4.2   Recent Labs    05/12/24 2137  LIPASE 19   Cardiac Enzymes No results for input(s): CKTOTAL, CKMB, CKMBINDEX, TROPONINI in the last 72 hours.  BNP: BNP (last 3 results) Recent Labs    12/20/23 1443  BNP 459.4*    ProBNP (last 3 results) No results for input(s): PROBNP in the last 8760 hours.   D-Dimer No results for input(s): DDIMER in the last 72 hours. Hemoglobin A1C No results for input(s): HGBA1C in the last 72 hours. Fasting Lipid Panel No results for input(s): CHOL, HDL, LDLCALC, TRIG, CHOLHDL, LDLDIRECT in the last 72 hours. Thyroid  Function Tests No results for input(s): TSH, T4TOTAL, T3FREE, THYROIDAB in the last 72 hours.  Invalid input(s): FREET3  Other results:   Imaging    No results found.   Medications:     Scheduled Medications:  amiodarone   150 mg Intravenous Once  carvedilol   3.125 mg Oral BID WC   Chlorhexidine  Gluconate Cloth  6 each Topical Daily   enoxaparin  (LOVENOX ) injection  40 mg Subcutaneous Daily   fluticasone  furoate-vilanterol  1 puff Inhalation Daily   furosemide   40 mg Oral Daily   hydrALAZINE   10 mg Oral Q8H   Influenza vac split trivalent PF  0.5 mL Intramuscular Tomorrow-1000   insulin  aspart  0-5 Units Subcutaneous QHS   insulin  aspart  0-6 Units Subcutaneous TID WC   isosorbide  mononitrate  30 mg Oral Daily   levothyroxine   112 mcg Oral Q0600   pantoprazole   40 mg Oral QAC breakfast   pravastatin   40 mg Oral Daily   sodium chloride  flush  3 mL Intravenous Q12H   spironolactone   12.5 mg Oral Daily    Infusions:  amiodarone  30 mg/hr (05/15/24 0500)    PRN Medications: acetaminophen , albuterol , LORazepam , mouth rinse, sodium chloride  flush    Patient Profile   Tanya Hogan is a 73 y.o. female with chronic HFrEF/nonischemic cardiomyopathy with EF 20%, VT on amiodarone , history of complete heart block now s/p BiV pacemaker,  hypertension, hyperlipidemia, diabetes, breast cancer s/p mastectomy.  Presented with VT. Recent episode of diverticulitis treated with ciprofloxacin  likely precipitating prolonged QTc/VT.     Assessment/Plan   VT - s/p cardioversion in ED - In setting of QT prolongation. Had been on cipro  for diverticulitis. Now off abx.  - Continue amiodarone  gtt at 30/hr - Increased base rate to 80BPM due to frequent breakthrough ectopy.  - EP consulted to discuss potentially upgrading device to BiV ICD  - Keep K > 4 and Mag > 2  Diverticulitis - Completed antibiotic course; no complaints. Nontender abdomen.  - Afebrile. WBCs 7.5K.  Diabetes Mellitus:  -SSI  Hypertension:  - BP above goal - See below regarding meds   HFrEF:  - TTE 10/30/23 w/ LVEF 20-25%, moderately reduced RV function - TTE on 05/13/24 with LVEF 20-25% -  Volume okay on exam. Continue lasix  40 mg daily. - GDMT limited by extensive allergy list (28 allergies).  - Continue coreg  3.125 mg BID - Continue home hydralazine  10 mg TID + Imdur  30 mg daily - Increase spironolactone  to 25 mg daily  Hypothyroidism:   - Continue levothryoxine 112 mcg   GERD: -Continue pantoprazole     Length of Stay: 2  FINCH, LINDSAY N, PA-C  05/15/2024, 7:05 AM  Advanced Heart Failure Team Pager [phone] (M-F; 7a - 5p)  Please contact [facility] for night-coverage after hours (5p -7a ) and weekends on [email or web address]   Patient seen with PA, agree with the above note.   She had 2 long runs VT yesterday pm, bolused with amiodarone  but no shock. K 3.7 today.    Currently A-BiV paced.   No dyspnea.   General: NAD Neck: No JVD, no thyromegaly or thyroid  nodule.  Lungs: Clear to auscultation bilaterally with normal respiratory effort. CV: Nondisplaced PMI.  Heart regular S1/S2, no S3/S4, no murmur.  No peripheral edema.  Abdomen: Soft, nontender, no hepatosplenomegaly, no distention.  Skin: Intact without lesions or rashes.  Neurologic:  Alert and oriented x 3.  Psych: Normal affect. Extremities: No clubbing or cyanosis.  HEENT: Normal.   VT in setting of ciprofloxacin  use and long QT.  Cardioverted in the ER. She has had sustained VT since that time but no further cardioversion.  - Continue amiodarone  gtt 30 mg/hr.   - She was on amiodarone  at home, ?addition of mexiletine but would  probably hold off as Cipro  may have triggered and she has multiple medication intolerances.  - EP following, will need upgrade to CRT-D.  She may need lead extraction to do this.   Volume status ok, will continue po Lasix , hydralazine /Imdur , Coreg , and spironolactone .  Multiple prior med intolerances.   Ezra Shuck 05/15/2024 9:57 AM

## 2024-05-15 NOTE — TOC Initial Note (Signed)
 Transition of Care [facility]) - Initial/Assessment Note    Patient Details  Name: Tanya Hogan MRN: [id number] Date of Birth: 1951-05-09  Transition of Care [facility]) CM/SW Contact:    Sudie Erminio Deems, RN Phone Number: 05/15/2024, 2:42 PM  Clinical Narrative: Patient presented for VT-EP is following. 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 Patient has questions regarding how she can get her brother placed in a facility-CSW to follow. Patient states she still drives to PCP appointments; she states it has been getting harder for her to dress and bathe herself. Patient will benefit from PT/OT consult. Inpatient Case Manager discussed home health services and the patient is open to services. ICM did call [facility] and they do service [facility] Medicare Dual Complete; however, the patient lives in [place] and the office will need to know when the patient is stable for discharge to see if can service. ICM will need to follow for a discharge date to provide to the office and follow for orders. Patient will benefit from [facility] PT/OT/Aide/ CSW once stable. Patient states she has discussed with [facility] to see if she is eligible for Aide services in the home for seniors- she is currently on the waiting list.    Expected Discharge Plan: Home w Home Health Services Barriers to Discharge: Continued Medical Work up   Patient Goals and CMS Choice Patient states their goals for this hospitalization and ongoing recovery are:: Patient is the caregiver for her brother and is concerned regarding his well-being. Wants to see if he can qualify for a SNF.          Expected Discharge Plan and Services In-house Referral: Clinical Social Work Discharge Planning Services: CM Consult Post Acute Care Choice: Home Health                     DME Agency: NA  Prior Living Arrangements/Services   Lives with:: Siblings Patient language and need for interpreter  reviewed:: Yes Do you feel safe going back to the place where you live?: Yes      Need for Family Participation in Patient Care: Yes (Comment) Care giver support system in place?: No (comment) Current home services: DME (rolling [place]) Criminal Activity/Legal Involvement Pertinent to Current Situation/Hospitalization: No - Comment as needed  Activities of Daily Living   ADL Screening (condition at time of admission) Independently performs ADLs?: Yes (appropriate for developmental age) Is the patient deaf or have difficulty hearing?: No Does the patient have difficulty seeing, even when wearing glasses/contacts?: No Does the patient have difficulty concentrating, remembering, or making decisions?: No  Permission Sought/Granted Permission sought to share information with : Case Manager     Emotional Assessment Appearance:: Appears stated age Attitude/Demeanor/Rapport: Engaged Affect (typically observed): Appropriate Orientation: : Oriented to Self, Oriented to Place, Oriented to  Time, Oriented to Situation Alcohol / Substance Use: Not Applicable Psych Involvement: No (comment)  Admission diagnosis:  V tach (HCC) [I47.20] V-tach (HCC) [I47.20] SVT (supraventricular tachycardia) [I47.10] Patient Active Problem List   Diagnosis Date Noted   SVT (supraventricular tachycardia) 05/13/2024   V tach (HCC) 05/12/2024   Brain fog 11/23/2023   Overflow diarrhea 11/23/2023   Sensation as if bladder still full 11/23/2023   Abdominal fullness 11/23/2023   Unintended weight loss 11/23/2023   Fracture of base of fifth metatarsal bone of left foot 11/02/2023   Presence of heart assist device (HCC) 10/07/2023   Primary  osteoarthritis of left shoulder 09/08/2023   Injection site reaction 05/17/2023   Nonischemic cardiomyopathy (HCC) 02/16/2023   Class 3 obesity 10/30/2022   Acute on chronic systolic CHF (congestive heart failure) (HCC) 10/29/2022   Stress and adjustment reaction 04/08/2022    Gastroesophageal reflux disease 02/16/2022   AV block, 3rd degree (HCC) 10/24/2021   COPD (chronic obstructive pulmonary disease) (HCC) 09/21/2021   Osteoporosis 02/28/2021   Primary osteoarthritis of both knees 01/14/2021   Irritable bowel syndrome with constipation 09/16/2020   Aortic atherosclerosis 06/11/2020   Benign lipomatous neoplasm of skin and subcutaneous tissue of left leg 06/11/2020   NSVT (nonsustained ventricular tachycardia) (HCC) 02/14/2020   Chronic constipation 10/06/2019   Genetic testing 08/14/2019   Ductal carcinoma in situ (DCIS) of right breast 06/15/2019   Arthralgia 04/11/2019   Malignant tumor of breast (HCC) 03/06/2019   Fatigue 03/06/2019   DDD (degenerative disc disease), cervical 11/19/2017   Peripheral edema 05/19/2017   Microcytic anemia 05/14/2017   Delayed sleep phase syndrome 10/29/2016   Primary osteoarthritis of left ankle 09/24/2016   Gastrointestinal food sensitivity 05/21/2016   History of arthroplasty of right shoulder 02/24/2016   Bilateral foot pain 01/22/2016   NASH (nonalcoholic steatohepatitis) 05/26/2015    Class: Stage 3   Nasal septal perforation 07/11/2014   Controlled diabetes mellitus type 2 with complications (HCC) 05/25/2014   Physical deconditioning 04/24/2014   Obesity, Class II, BMI 35-39.9, with comorbidity 06/09/2013   OSA (obstructive sleep apnea) 06/08/2013   Chronic systolic heart failure (HCC) 05/23/2013   Asthma, moderate persistent 04/18/2013   Chronic cough 04/18/2013   Breast cancer of upper-inner quadrant of left female breast (HCC) 03/18/2010   Type 2 diabetes mellitus with hyperlipidemia (HCC) 11/07/2009   Fibromyalgia 10/29/2009   Biventricular cardiac pacemaker in situ 10/23/2009   Abnormal levels of other serum enzymes 10/13/2009   Hypothyroidism 10/03/2009   Essential hypertension 07/02/2009   Chronic back pain 07/02/2009   PCP:  [name], MD Pharmacy:   [facility] [phone] -  [place], [place] - [address] AT [facility] [address] [place] [address] Phone: [phone] Fax: [phone]  [facility]) - [place], [place] - [address] [facility] [address] [place] [address] [place] [address] Phone: [phone] Fax: [phone]  [facility] [id number] - [place], [place] - [address] [place] [address] Phone: [phone] Fax: [phone]  [facility] - [place], [place] - [address] [place] [address] Phone: [phone] Fax: [phone]  [name] [address] [place] [address] Phone: [phone] Fax: [phone]  [facility] - [place], [place] - [address] [place] [address] Phone: [phone] Fax: [phone]     Social Drivers of Health (SDOH) Social History: SDOH Screenings   Food Insecurity: No Food Insecurity (05/13/2024)  Housing: Low Risk  (05/13/2024)  Transportation Needs: No Transportation Needs (05/13/2024)  Utilities: Not At Risk (05/13/2024)  Alcohol Screen: Low Risk  (08/10/2023)  Depression (PHQ2-9): Low Risk  (02/18/2024)  Financial Resource Strain: Medium Risk (08/10/2023)  Physical Activity: Sufficiently Active (08/10/2023)  Recent Concern: Physical Activity - Insufficiently Active (07/12/2023)  Social Connections: Moderately Integrated (05/13/2024)  Stress: No Stress Concern Present (05/08/2024)   Received from [facility]  Tobacco Use: Medium  Risk (05/12/2024)  Health Literacy: Adequate Health Literacy (08/10/2023)   SDOH Interventions:     Readmission Risk Interventions     No data to display

## 2024-05-15 NOTE — Consult Note (Signed)
 ELECTROPHYSIOLOGY CONSULT NOTE    Patient ID: Tanya Hogan MRN: [id number], DOB/AGE: [birth year] 73 y.o.  Admit date: 05/12/2024 Date of Consult: 05/15/2024  Primary Physician: [name], MD Primary Cardiologist: Redell Shallow, MD  Electrophysiologist: Dr. Waddell   Referring Provider: Dr. Gardenia  Patient Profile: Tanya Hogan is a 73 y.o. female with a history of HTN, HLD, DM, LBBB, NICM, OSA w/CPAP, CHB, chronic CHF (systolic), w/CRT-P, PVCs, Abandoned RV lead, and breast Ca (s/p mastectomy) who is being seen today for the evaluation of VT at the request of Dr. Gardenia.  HPI:  Tanya Hogan is a 73 y.o. female with complicated history as above.   In 2023 pt developed gradually elevated LV threshold and paced QRS was . Underwent new Left bundle area RV lead, and abandoned old lead  Previously admitted 1/5 - 09/03/22 with Symptomatic VT. Coreg  increased and rebolused on amio, which she had been maintained on.   Pt admitted 9/14 - 05/10/2024 at [facility] for acute diverticulitis. Treated with cipro +Flagyl .   Presented to [facility] 05/12/2024 with abdominal pain, nausea/vomiting, and diaphoresis. Found to be in WCT in 180s consistent with VT and requiring urgent cardiversion due to symptoms. Has been re-loaded with IV amidoarone. LRL increased over the weekend.  She has continued to have brief runs of NSVT - EP asked to see to discuss and consider upgrade of her device to ICD.   Today, she is feeling OK. Abdominal/GI symptoms have much improved. She has now completed ABx.  Discussed possible venues to ICD upgrade, pt asked and answer questions appropriately. Currently, denies chest pain, dyspnea, or PND. Had mild dizziness when in VT, but no frank syncope.   Labs Potassium3.7 (09/22 0247) Magnesium   2.6* (09/21 1827) Creatinine, ser  0.84 (09/22 0247) PLT  259 (09/22 0247) HGB  12.6 (09/22 0247) WBC 7.5 (09/22 0247)  .    Allergies, Medical, Surgical, Social,  and Family Histories have been reviewed and are referenced here-in when relevant for medical decision making.   Surgical History:  Past Surgical History:  Procedure Laterality Date   BIV PACEMAKER GENERATOR CHANGEOUT N/A 11/03/2021   Procedure: BIV PACEMAKER GENERATOR CHANGEOUT;  Surgeon: Waddell Danelle ORN, MD;  Location: [facility] INVASIVE CV LAB;  Service: Cardiovascular;  Laterality: N/A;   BREAST LUMPECTOMY Left 2011   COLONOSCOPY     FOOT SURGERY     KNEE SURGERY     LEAD REVISION/REPAIR N/A 11/03/2021   Procedure: LEAD REVISION/REPAIR;  Surgeon: Waddell Danelle ORN, MD;  Location: [facility] INVASIVE CV LAB;  Service: Cardiovascular;  Laterality: N/A;   MASTECTOMY Right    05/2019   PACEMAKER GENERATOR CHANGE N/A 08/23/2014   Procedure: PACEMAKER GENERATOR CHANGE;  Surgeon: Danelle ORN Waddell, MD;  Location: [facility] CATH LAB;  Service: Cardiovascular;  Laterality: N/A;   PACEMAKER PLACEMENT  2009   [facility]   RECTAL SURGERY  1976   REDUCTION MAMMAPLASTY Left    05/2019   TOTAL SHOULDER ARTHROPLASTY Right 03/10/2018   TOTAL SHOULDER ARTHROPLASTY Right 03/10/2018   Procedure: RIGHT TOTAL SHOULDER ARTHROPLASTY;  Surgeon: Dozier Soulier, MD;  Location: [facility] OR;  Service: Orthopedics;  Laterality: Right;   TUBAL LIGATION        Physical Exam: Vitals:   05/15/24 0616 05/15/24 0700 05/15/24 0752 05/15/24 0819  BP: 126/75 134/73    Pulse:  80    Resp:  20    Temp:   (!) 96.4 F (35.8 C)   TempSrc:   Axillary  SpO2:  97%  97%  Weight:      Height:        GEN- NAD, A&O x 3, normal affect HEENT: Normocephalic, atraumatic Lungs- CTAB, Normal effort.  Heart- Regular rate and rhythm, No M/G/R.  GI- Soft, NT, ND.  Extremities- No clubbing, cyanosis, or edema   Radiology/Studies:   Echo 05/13/2024 LVEF 20-25%, Grade 2 DD, Severe LAE, Severe RAE, Mild MR, mild/Mod TR,.    EKG: on arrival as below (personally reviewed)  Follow up EKGs with AS-VP rhythm at 78 with QT OK when corrected for wide  QRS.     TELEMETRY: AV dual paced rhythm at 80 bpms. Occasional PVCs and rare brief runs of NSVT continue (personally reviewed)  DEVICE HISTORY:  Medtronic CRT-P implanted 10/2001 07/2014 Gen change  10/2021 Gen change - New Left bundle area RV lead - RV lead abandoned  Assessment/Plan:  #Sustained VT #NSVT/PVCs History of the same. Previously felt to be a poor upgrade candidate given co-morbidities and abandoned lead.  Potentially exacerbated by ciprofloxacin  which can cause QT prolongation.   Long discussion with pt. Options include:  Continued medical treatment without surgical intervention.  2.  Venogram and ICD upgrade leaving old equipment in place, Increase potential for SVC stenosis.  3. Extraction of old RV lead(s) and new CRT-D system.   Regardless of option chosen, given high risk would likely consider as an outpatient as long as her VT burden continues to improve.   #CHB s/p Medtronic CRT-P V Paced >98% of the time.  Previously had RV threshold increase and underwent left bundle area RV lead, with abandonment of prior lead from 2003.   #Diverticulitis Treated with ABx starting 9/17. Now has completed course  Ciprofloxacin  can prolong QT, so may have been an aggravating factor.  Will likely want more distance from her acute illness prior to considering invasive, high risk surgery - as long as her VT continues to improve.   No extractor in the hospital today to fully discuss patients options. Will have Dr. Nancey say hello, and Dr. Kennyth or Dr. Almetta will see tomorrow and more fully discuss potential options including extraction.    For questions or updates, please contact [facility] Please consult [email or web address] for contact info under     Signed, Ozell Prentice Lesia DEVONNA  05/15/2024, 8:33 AM

## 2024-05-16 DIAGNOSIS — I472 Ventricular tachycardia, unspecified: Secondary | ICD-10-CM | POA: Diagnosis not present

## 2024-05-16 LAB — BASIC METABOLIC PANEL WITH GFR
Anion gap: 9 (ref 5–15)
BUN: 11 mg/dL (ref 8–23)
CO2: 24 mmol/L (ref 22–32)
Calcium: 9.3 mg/dL (ref 8.9–10.3)
Chloride: 105 mmol/L (ref 98–111)
Creatinine, Ser: 0.75 mg/dL (ref 0.44–1.00)
GFR, Estimated: >60 mL/min (ref >60)
Glucose, Bld: 121 mg/dL — ABNORMAL HIGH (ref 70–99)
Potassium: 4.2 mmol/L (ref 3.5–5.1)
Sodium: 138 mmol/L (ref 135–145)

## 2024-05-16 LAB — CBC
HCT: 39.1 % (ref 36.0–46.0)
Hemoglobin: 12.9 g/dL (ref 12.0–15.0)
MCH: 32.5 pg (ref 26.0–34.0)
MCHC: 33 g/dL (ref 30.0–36.0)
MCV: 98.5 fL (ref 80.0–100.0)
Platelets: 280 K/uL (ref 150–400)
RBC: 3.97 MIL/uL (ref 3.87–5.11)
RDW: 13.2 % (ref 11.5–15.5)
WBC: 7.2 K/uL (ref 4.0–10.5)
nRBC: 0 % (ref 0.0–0.2)

## 2024-05-16 LAB — GLUCOSE, CAPILLARY
Glucose-Capillary: 118 mg/dL — ABNORMAL HIGH (ref 70–99)
Glucose-Capillary: 125 mg/dL — ABNORMAL HIGH (ref 70–99)
Glucose-Capillary: 112 mg/dL — ABNORMAL HIGH (ref 70–99)
Glucose-Capillary: 130 mg/dL — ABNORMAL HIGH (ref 70–99)

## 2024-05-16 MED ORDER — ALPRAZOLAM 0.25 MG PO TABS
0.2500 mg | ORAL_TABLET | Freq: Two times a day (BID) | ORAL | Status: DC | PRN
Start: 2024-05-16 — End: 2024-05-24
  Administered 2024-05-16 – 2024-05-23 (×10): 0.25 mg via ORAL
  Filled 2024-05-16 (×10): qty 1

## 2024-05-16 MED ORDER — ALUM & MAG HYDROXIDE-SIMETH 200-200-20 MG/5ML PO SUSP
15.0000 mL | Freq: Once | ORAL | Status: AC
  Administered 2024-05-16: 30 mL via ORAL
  Filled 2024-05-16: qty 30

## 2024-05-16 MED ORDER — AMIODARONE HCL 200 MG PO TABS: 200.0000 mg | ORAL_TABLET | Freq: Two times a day (BID) | ORAL | Status: DC

## 2024-05-16 MED ORDER — AMIODARONE HCL 200 MG PO TABS: 400.0000 mg | ORAL_TABLET | Freq: Two times a day (BID) | ORAL | Status: DC

## 2024-05-16 MED ORDER — AMIODARONE HCL 200 MG PO TABS
200.0000 mg | ORAL_TABLET | Freq: Once | ORAL | Status: AC
  Administered 2024-05-16: 200 mg via ORAL
  Filled 2024-05-16: qty 1

## 2024-05-16 MED ORDER — AMIODARONE HCL 200 MG PO TABS
400.0000 mg | ORAL_TABLET | Freq: Two times a day (BID) | ORAL | Status: DC
  Administered 2024-05-16 – 2024-05-17 (×2): 400 mg via ORAL
  Filled 2024-05-16 (×2): qty 2

## 2024-05-16 MED ORDER — HYDRALAZINE HCL 25 MG PO TABS
25.0000 mg | ORAL_TABLET | Freq: Three times a day (TID) | ORAL | Status: DC
  Administered 2024-05-16 – 2024-05-23 (×19): 25 mg via ORAL
  Filled 2024-05-16 (×21): qty 1

## 2024-05-16 MED ORDER — AMIODARONE HCL 200 MG PO TABS
200.0000 mg | ORAL_TABLET | Freq: Two times a day (BID) | ORAL | Status: DC
  Administered 2024-05-16: 200 mg via ORAL
  Filled 2024-05-16: qty 1

## 2024-05-16 NOTE — Progress Notes (Addendum)
 Patient Name: Tanya Hogan Date of Encounter: 05/16/2024  Primary Cardiologist: Redell Shallow, MD Electrophysiologist: Danelle Birmingham, MD  Interval Summary   Feeling OK this am. No further VT. A little overwhelmed with the device discussions.  She is willing to proceed with defibrillator, whatever that may look like.   Vital Signs    Vitals:   05/16/24 0515 05/16/24 0534 05/16/24 0600 05/16/24 0700  BP:  119/71 121/74 128/83  Pulse:   80 80  Resp:   (!) 27 (!) 34  Temp:   97.8 F (36.6 C)   TempSrc:   Oral   SpO2: 97%  92% 94%  Weight:      Height:        Intake/Output Summary (Last 24 hours) at 05/16/2024 0829 Last data filed at 05/16/2024 0700 Gross per 24 hour  Intake 864.93 ml  Output 1000 ml  Net -135.07 ml   Filed Weights   05/14/24 0500 05/15/24 0400 05/16/24 0500  Weight: 88 kg 86.7 kg 87 kg    Physical Exam    GEN- NAD, Alert and oriented  Lungs- Clear to ausculation bilaterally, normal work of breathing Cardiac- Regular rate and rhythm, no murmurs, rubs or gallops GI- soft, NT, ND, + BS Extremities- no clubbing or cyanosis. No edema  Telemetry    AV dual paced at 80, occasional PVCs no further sustained VT (personally reviewed)  Hospital Course    SAAMIYA Hogan is a 73 y.o. female with a history of HTN, HLD, DM, LBBB, NICM, OSA w/CPAP, CHB, chronic CHF (systolic), w/CRT-P, PVCs, Abandoned RV lead, and breast Ca (s/p mastectomy) who is being seen today for the evaluation of VT at the request of Dr. Gardenia.   Assessment & Plan    #Sustained VT #NSVT/PVCs History of the same. Previously felt to be a poor upgrade candidate given co-morbidities and abandoned lead.  Potentially exacerbated by ciprofloxacin  which can cause QT prolongation.  Change amiodarone  to 200 mg BID   Long discussion with pt. Options include:  1.  Venogram and ICD upgrade leaving old equipment in place, Increase potential for SVC stenosis.  2. Extraction of old RV lead(s)  and new CRT-D system.  3. S-ICD in addition to her current system.    EP MDs to discuss best potential option to proceed.    #CHB s/p Medtronic CRT-P V Paced >98% of the time.  Previously had RV threshold increase and underwent left bundle area RV lead, with abandonment of prior lead from 2003.    #Diverticulitis Treated with ABx starting 9/17. Now has completed course  Ciprofloxacin  can prolong QT, so may have been an aggravating factor.  May want more distance from her acute illness prior to considering invasive surgery - as long as her VT continues to improve.   Dr. Kennyth has seen.    For questions or updates, please contact [facility] Please consult [email or web address] for contact info under     Signed, Ozell Prentice Passey, PA-C  05/16/2024, 8:29 AM    I have seen, examined the patient, and reviewed the above assessment and plan.    Interval:  No acute overnight events. Patient reports feeling relatively well. No new or acute complaints.  No recurrence of ventricular arrhythmias on telemetry.  General: Well developed, in no acute distress.  Neck: No JVD.  Cardiac: Normal rate, regular rhythm.  Resp: Normal work of breathing.  Ext: No edema.  Neuro: No gross focal deficits.  Psych: Normal affect.   Assessment:  Tanya Hogan is a 73 y.o. female with a history of HTN, HLD, DM, LBBB, NICM, OSA w/CPAP, CHB, chronic CHF (systolic), w/CRT-P, PVCs, Abandoned RV lead, and breast Ca (s/p mastectomy) who is being seen today for the evaluation of VT at the request of Dr. Gardenia.   Problem List:  Sustained monomorphic ventricular tachycardia Complete heart block status post CRT-P Chronic systolic heart failure  Plan:  - Patient meets criteria for secondary prevention ICD given sustained monomorphic ventricular tachycardia and chronic systolic heart failure with LVEF less than 35% despite greater than 90 days guideline directed medical therapy.  She had ventricular  tachycardia despite medical therapy with amiodarone .  Patient wants defibrillator therapies.  The approach to providing her with a defibrillator is a complicated wound given that she already has a CRT-P and an abandoned RV pacing lead.  Perhaps, the easiest option would be to implant a subcutaneous ICD, but this would not provide her with ATP.  Another consideration would be to remove her most recently implanted pacemaker lead, left bundle branch area pacing lead from 2023, and implant in RV ICD lead in its place.  Will discuss with her primary EP, Dr. Waddell and formulate a plan. - Transition to oral amiodarone  for now.  Continue monitoring on telemetry for at least 24 hours on a oral amiodarone .  [name], MD 05/16/2024 11:07 PM

## 2024-05-16 NOTE — Progress Notes (Signed)
 Pt. Placed on bipap by RN . Pt. Tolerating well.

## 2024-05-16 NOTE — Plan of Care (Signed)
 Patient's amio IV infusion was D/C at 8 am, and the patient's cardiac rhythm was sustained with PO amio. Patient did have a 14 beat run of non-sustained V Tach at 13:30 pm, MD notified and CV strip was printed for review. Patient did not lose pulses and remained alert. Patient's PO amio was increased to 400 mg. Patient was up to chair all day, rested well, and continued to have an adequate appetite. PT/OT consult is in place. CCMD still contemplating ICD placement. Transfer orders are ongoing.

## 2024-05-16 NOTE — Progress Notes (Addendum)
 Advanced Heart Failure Rounding Note  Cardiologist: Redell Shallow, MD HF Cardiologist: Dr. Gardenia   Chief Complaint: VT  Subjective:    VT quiescent.   AV paced 80. EP converted IV amiodarone  to po.  Lots of anxiety after VT episodes. Got IV ativan  this morning and is now sleepy.  Objective:   Weight Range: 87 kg Body mass index is 36.24 kg/m.   Vital Signs:   Temp:  [97.5 F (36.4 C)-98 F (36.7 C)] 97.8 F (36.6 C) (09/23 0600) Pulse Rate:  [78-83] 83 (09/23 0800) Resp:  [15-35] 35 (09/23 0800) BP: (98-135)/(56-97) 111/71 (09/23 0800) SpO2:  [92 %-97 %] 96 % (09/23 0800) Weight:  [87 kg] 87 kg (09/23 0500) Last BM Date : 05/15/24  Weight change: Filed Weights   05/14/24 0500 05/15/24 0400 05/16/24 0500  Weight: 88 kg 86.7 kg 87 kg    Intake/Output:   Intake/Output Summary (Last 24 hours) at 05/16/2024 0904 Last data filed at 05/16/2024 0844 Gross per 24 hour  Intake 1105.18 ml  Output 1200 ml  Net -94.82 ml      Physical Exam    General:  Chronically ill appearing elderly female. Cor: No JVD. Regular rate & rhythm. No murmurs. Lungs: breathing nonlabored Abdomen: obese, nontender, nondistended. Extremities: no edema Neuro: Sleepy but wakes up and is appropriate.     Telemetry   AV paced 80  Labs    CBC Recent Labs    05/15/24 0247 05/16/24 0244  WBC 7.5 7.2  HGB 12.6 12.9  HCT 38.4 39.1  MCV 97.7 98.5  PLT 259 280   Basic Metabolic Panel Recent Labs    90/78/74 1827 05/15/24 0247 05/16/24 0244  NA 137 138 138  K 3.6 3.7 4.2  CL 103 105 105  CO2 23 24 24   GLUCOSE 145* 119* 121*  BUN 13 10 11   CREATININE 1.17* 0.84 0.75  CALCIUM  9.1 8.9 9.3  MG 2.6*  --   --    Liver Function Tests No results for input(s): AST, ALT, ALKPHOS, BILITOT, PROT, ALBUMIN in the last 72 hours.  No results for input(s): LIPASE, AMYLASE in the last 72 hours.  Cardiac Enzymes No results for input(s): CKTOTAL, CKMB,  CKMBINDEX, TROPONINI in the last 72 hours.  BNP: BNP (last 3 results) Recent Labs    12/20/23 1443  BNP 459.4*    ProBNP (last 3 results) No results for input(s): PROBNP in the last 8760 hours.   D-Dimer No results for input(s): DDIMER in the last 72 hours. Hemoglobin A1C No results for input(s): HGBA1C in the last 72 hours. Fasting Lipid Panel No results for input(s): CHOL, HDL, LDLCALC, TRIG, CHOLHDL, LDLDIRECT in the last 72 hours. Thyroid  Function Tests No results for input(s): TSH, T4TOTAL, T3FREE, THYROIDAB in the last 72 hours.  Invalid input(s): FREET3  Other results:   Imaging    No results found.   Medications:     Scheduled Medications:  amiodarone   200 mg Oral BID   carvedilol   3.125 mg Oral BID WC   Chlorhexidine  Gluconate Cloth  6 each Topical Daily   enoxaparin  (LOVENOX ) injection  40 mg Subcutaneous Daily   fluticasone  furoate-vilanterol  1 puff Inhalation Daily   furosemide   40 mg Oral Daily   hydrALAZINE   10 mg Oral Q8H   Influenza vac split trivalent PF  0.5 mL Intramuscular Tomorrow-1000   insulin  aspart  0-5 Units Subcutaneous QHS   insulin  aspart  0-6 Units Subcutaneous TID WC  isosorbide  mononitrate  30 mg Oral Daily   levothyroxine   112 mcg Oral Q0600   pantoprazole   40 mg Oral QAC breakfast   pravastatin   40 mg Oral Daily   sodium chloride  flush  3 mL Intravenous Q12H   spironolactone   25 mg Oral Daily    Infusions:    PRN Medications: acetaminophen , albuterol , ALPRAZolam , mouth rinse, sodium chloride  flush    Patient Profile   Tanya Hogan is a 73 y.o. female with chronic HFrEF/nonischemic cardiomyopathy with EF 20%, VT on amiodarone , history of complete heart block now s/p BiV pacemaker, hypertension, hyperlipidemia, diabetes, breast cancer s/p mastectomy.  Presented with VT. Recent episode of diverticulitis treated with ciprofloxacin  likely precipitating prolonged QTc/VT.      Assessment/Plan   VT - s/p cardioversion in ED - In setting of QT prolongation. Had been on cipro  for diverticulitis. Now off abx.  - Increased base rate to 80BPM due to frequent breakthrough ectopy.  - Converting IV amiodarone  to PO today - EP discussing options for device. May need extraction of old RV lead and new CRT-D system vs S-ICD in addition to current system. - Keep K > 4 and Mag > 2  Diverticulitis - Completed antibiotic course; no complaints.  - No fever or leukocytosis  Diabetes Mellitus:  -SSI  Hypertension:  - SBP 120s w/ MAP 80s-90s - See below regarding meds  HFrEF:  - TTE 10/30/23 w/ LVEF 20-25%, moderately reduced RV function - TTE on 05/13/24 with LVEF 20-25% -  Volume okay on exam. Continue lasix  40 mg daily. - GDMT limited by extensive allergy list (28 allergies).  - Continue coreg  3.125 mg BID - Increase hydralazine  to 25 mg TID + Continue Imdur  30 mg daily - Continue spironolactone  to 25 mg daily  Hypothyroidism:   - Continue levothryoxine 112 mcg   GERD: -Continue pantoprazole    Transfer out of ICU today.  Length of Stay: 3  FINCH, LINDSAY N, PA-C  05/16/2024, 9:04 AM  Advanced Heart Failure Team Pager [phone] (M-F; 7a - 5p)  Please contact [facility] for night-coverage after hours (5p -7a ) and weekends on [email or web address]   Patient seen with PA, agree with the above note.   No further VT.  No dyspnea.   General: NAD Neck: No JVD, no thyromegaly or thyroid  nodule.  Lungs: Clear to auscultation bilaterally with normal respiratory effort. CV: Nondisplaced PMI.  Heart regular S1/S2, no S3/S4, no murmur.  No peripheral edema.   Abdomen: Soft, nontender, no hepatosplenomegaly, no distention.  Skin: Intact without lesions or rashes.  Neurologic: Alert and oriented x 3.  Psych: Normal affect. Extremities: No clubbing or cyanosis.  HEENT: Normal.   VT in setting of ciprofloxacin  use and long QT.  Cardioverted in the ER. She has had  sustained VT since that time but no further cardioversion. No VT overnight.  - Transition to po amiodarone  today.  - EP following, will need upgrade to CRT-D.  EP is trying to decide on the best way to do this (see EP note from today).    Volume status ok.  Multiple prior med intolerances. Increase hydralazine  incrementally today as above.   Ezra Shuck 05/16/2024 11:28 AM

## 2024-05-17 DIAGNOSIS — I472 Ventricular tachycardia, unspecified: Secondary | ICD-10-CM | POA: Diagnosis not present

## 2024-05-17 LAB — BASIC METABOLIC PANEL WITH GFR
Anion gap: 9 (ref 5–15)
BUN: 14 mg/dL (ref 8–23)
CO2: 24 mmol/L (ref 22–32)
Calcium: 9.4 mg/dL (ref 8.9–10.3)
Chloride: 106 mmol/L (ref 98–111)
Creatinine, Ser: 0.77 mg/dL (ref 0.44–1.00)
GFR, Estimated: >60 mL/min (ref >60)
Glucose, Bld: 115 mg/dL — ABNORMAL HIGH (ref 70–99)
Potassium: 4.1 mmol/L (ref 3.5–5.1)
Sodium: 139 mmol/L (ref 135–145)

## 2024-05-17 LAB — CBC
HCT: 39.3 % (ref 36.0–46.0)
Hemoglobin: 12.8 g/dL (ref 12.0–15.0)
MCH: 31.8 pg (ref 26.0–34.0)
MCHC: 32.6 g/dL (ref 30.0–36.0)
MCV: 97.5 fL (ref 80.0–100.0)
Platelets: 289 K/uL (ref 150–400)
RBC: 4.03 MIL/uL (ref 3.87–5.11)
RDW: 13.2 % (ref 11.5–15.5)
WBC: 8.3 K/uL (ref 4.0–10.5)
nRBC: 0 % (ref 0.0–0.2)

## 2024-05-17 LAB — GLUCOSE, CAPILLARY
Glucose-Capillary: 106 mg/dL — ABNORMAL HIGH (ref 70–99)
Glucose-Capillary: 107 mg/dL — ABNORMAL HIGH (ref 70–99)
Glucose-Capillary: 114 mg/dL — ABNORMAL HIGH (ref 70–99)
Glucose-Capillary: 106 mg/dL — ABNORMAL HIGH (ref 70–99)

## 2024-05-17 LAB — MAGNESIUM: Magnesium: 2 mg/dL (ref 1.7–2.4)

## 2024-05-17 MED ORDER — OXYCODONE HCL 5 MG PO TABS
5.0000 mg | ORAL_TABLET | Freq: Once | ORAL | Status: DC | PRN
Start: 2024-05-17 — End: 2024-05-24
  Filled 2024-05-17: qty 1

## 2024-05-17 MED ORDER — OXYCODONE HCL 5 MG PO TABS
5.0000 mg | ORAL_TABLET | Freq: Once | ORAL | Status: DC
  Filled 2024-05-17: qty 1

## 2024-05-17 MED ORDER — AMIODARONE HCL 200 MG PO TABS
200.0000 mg | ORAL_TABLET | Freq: Two times a day (BID) | ORAL | Status: DC
  Administered 2024-05-22 – 2024-05-24 (×5): 200 mg via ORAL
  Filled 2024-05-17 (×5): qty 1

## 2024-05-17 MED ORDER — AMIODARONE HCL 200 MG PO TABS
400.0000 mg | ORAL_TABLET | Freq: Two times a day (BID) | ORAL | Status: AC
  Administered 2024-05-17 – 2024-05-21 (×9): 400 mg via ORAL
  Filled 2024-05-17 (×9): qty 2

## 2024-05-17 MED ORDER — CHLORHEXIDINE GLUCONATE CLOTH 2 % EX PADS: 6.0000 | MEDICATED_PAD | Freq: Every day | CUTANEOUS | Status: DC

## 2024-05-17 MED ORDER — NITROGLYCERIN 0.4 MG SL SUBL: 0.4000 mg | SUBLINGUAL_TABLET | SUBLINGUAL | Status: DC | PRN

## 2024-05-17 MED ORDER — MUPIROCIN 2 % EX OINT
1.0000 | TOPICAL_OINTMENT | Freq: Two times a day (BID) | CUTANEOUS | Status: AC
  Administered 2024-05-17 – 2024-05-21 (×9): 1 via NASAL
  Filled 2024-05-17 (×2): qty 22

## 2024-05-17 MED ORDER — NITROGLYCERIN 0.4 MG SL SUBL
SUBLINGUAL_TABLET | SUBLINGUAL | Status: AC
  Filled 2024-05-17: qty 1

## 2024-05-17 NOTE — Evaluation (Signed)
 Physical Therapy Evaluation Patient Details Name: Tanya Hogan MRN: [id number] DOB: December 10, 1950 Today's Date: 05/17/2024  History of Present Illness  Tanya Hogan is a 73 y.o. female admitted with emesis and Vtach s/p cardioversion  PMH: nonischemic cardiomyopathy with EF 20%, VT on amiodarone , h/o complete heart block now s/p BiV pacemaker, HTN, HLD, diabetes   Clinical Impression  Pt admitted with above. Pt currently functioning at contact guard level with use of RW. 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 Anticipate pt to progress well enough to return home with family however suggested patient increase hired help hours to help her brother as she recovers from this hospital stay. Acute PT to cont to follow.        If plan is discharge home, recommend the following: Assist for transportation   Can travel by private vehicle        Equipment Recommendations None recommended by PT  Recommendations for Other Services       Functional Status Assessment Patient has had a recent decline in their functional status and demonstrates the ability to make significant improvements in function in a reasonable and predictable amount of time.     Precautions / Restrictions Precautions Precautions: Fall Recall of Precautions/Restrictions: Intact Restrictions Weight Bearing Restrictions Per Provider Order: No      Mobility  Bed Mobility Overal bed mobility: Needs Assistance Bed Mobility: Supine to Sit     Supine to sit: Supervision     General bed mobility comments: HOB flat to mimic home, use of bed rail, increased time    Transfers Overall transfer level: Needs assistance Equipment used: Rolling Hilyer (2 wheels) Transfers: Sit to/from Stand Sit to Stand: Supervision           General transfer comment: verbal cues to push up from bed not pull up from [place], pt incontinent of urine    Ambulation/Gait Ambulation/Gait  assistance: Contact guard assist Gait Distance (Feet): 150 Feet Assistive device: Rolling Schobert (2 wheels) Gait Pattern/deviations: Step-through pattern, Decreased stride length, Drifts right/left Gait velocity: dec Gait velocity interpretation: 1.31 - 2.62 ft/sec, indicative of limited community ambulator   General Gait Details: progressive UE dependency with onset of fatigue, mild DOE  Acupuncturist Bed    Modified Rankin (Stroke Patients Only)       Balance Overall balance assessment: Mild deficits observed, not formally tested                                           Pertinent Vitals/Pain Pain Assessment Pain Assessment: No/denies pain    Home Living Family/patient expects to be discharged to:: Private residence Living Arrangements: Other relatives (Brother) Available Help at Discharge: Family;Available PRN/intermittently Type of Home: House Home Access: Ramped entrance       Home Layout: One level Home Equipment: Agricultural consultant (2 wheels);Shower seat Additional Comments: 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    Prior Function Prior Level of Function : Independent/Modified Independent;Driving             Mobility Comments: uses RW because she doesn't want to break something not because she needs the support, doesn't use [place] in home ADLs Comments: indep     Extremity/Trunk Assessment   Upper Extremity Assessment Upper  Extremity Assessment: Overall WFL for tasks assessed    Lower Extremity Assessment Lower Extremity Assessment: Overall WFL for tasks assessed    Cervical / Trunk Assessment Cervical / Trunk Assessment: Normal  Communication   Communication Communication: No apparent difficulties    Cognition Arousal: Alert Behavior During Therapy: WFL for tasks assessed/performed   PT - Cognitive impairments: No apparent impairments                       PT - Cognition  Comments: pt states this is like a vacation regarding not having to provide care for her brother Following commands: Intact       Cueing Cueing Techniques: Verbal cues     General Comments General comments (skin integrity, edema, etc.): VSS, incontinent of urine    Exercises     Assessment/Plan    PT Assessment Patient needs continued PT services  PT Problem List Decreased activity tolerance;Decreased balance;Decreased mobility       PT Treatment Interventions DME instruction;Gait training;Functional mobility training;Therapeutic activities;Therapeutic exercise;Balance training    PT Goals (Current goals can be found in the Care Plan section)  Acute Rehab PT Goals Patient Stated Goal: didn't state PT Goal Formulation: With patient Time For Goal Achievement: 05/31/24 Potential to Achieve Goals: Good    Frequency Min 2X/week     Co-evaluation               AM-PAC PT 6 Clicks Mobility  Outcome Measure Help needed turning from your back to your side while in a flat bed without using bedrails?: None Help needed moving from lying on your back to sitting on the side of a flat bed without using bedrails?: None Help needed moving to and from a bed to a chair (including a wheelchair)?: None Help needed standing up from a chair using your arms (e.g., wheelchair or bedside chair)?: None Help needed to walk in hospital room?: A Little Help needed climbing 3-5 steps with a railing? : A Little 6 Click Score: 22    End of Session Equipment Utilized During Treatment: Gait belt Activity Tolerance: Patient tolerated treatment well Patient left: in chair;with call bell/phone within reach;with chair alarm set;with nursing/sitter in room Nurse Communication: Mobility status (urinary incontinence) PT Visit Diagnosis: Difficulty in walking, not elsewhere classified (R26.2)    Time: 9056-8987 PT Time Calculation (min) (ACUTE ONLY): 29 min   Charges:   PT Evaluation $PT Eval  Low Complexity: 1 Low PT Treatments $Gait Training: 8-22 mins PT General Charges $$ ACUTE PT VISIT: 1 Visit         Norene Ames, PT, DPT Acute Rehabilitation Services Secure chat preferred Office #: [phone]   Norene CHRISTELLA Ames 05/17/2024, 10:42 AM

## 2024-05-17 NOTE — Progress Notes (Addendum)
 Patient complained pain that felt like a foot was on her chest that seem to subside with rest. Patient shortly after stated that her left shoulder down to her arm was hurting and was going numb. EKG done and 1 SL nitroglycerin  given. Salah MD paged.

## 2024-05-17 NOTE — Evaluation (Signed)
 Occupational Therapy Evaluation Patient Details Name: Tanya Hogan MRN: [id number] DOB: [birth year] Today's Date: 05/17/2024   History of Present Illness   Tanya Hogan is a 73 y.o. 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  Patient currently presents with slight weakness and activity tolerance in which further OT intervention is warranted to address functional deficits in order for patient to return to PLOF.  OT recommending continued services while admitted here at [facility].     If plan is discharge home, recommend the following:   Assistance with cooking/housework     Functional Status Assessment   Patient has had a recent decline in their functional status and demonstrates the ability to make significant improvements in function in a reasonable and predictable amount of time.     Equipment Recommendations   None recommended by OT     Recommendations for Other Services         Precautions/Restrictions   Precautions Precautions: Fall Recall of Precautions/Restrictions: Intact Restrictions Weight Bearing Restrictions Per Provider Order: No     Mobility Bed Mobility Overal bed mobility:  (patient sitting in chair upon arrival into room)                  Transfers Overall transfer level: Needs assistance Equipment used: Rolling [place] (2 wheels) Transfers: Sit to/from Stand Sit to Stand: Supervision                  Balance                                           ADL either performed or assessed with clinical judgement   ADL Overall ADL's : Needs  assistance/impaired Eating/Feeding: Independent;Sitting   Grooming: Wash/dry face;Wash/dry hands;Set up   Upper Body Bathing: Set up;Sitting   Lower Body Bathing: Minimal assistance   Upper Body Dressing : Set up;Sitting   Lower Body Dressing: Minimal assistance   Toilet Transfer: Contact guard assist;Rolling Pepitone (2 wheels)   Toileting- Clothing Manipulation and Hygiene: Minimal assistance       Functional mobility during ADLs: Contact guard assist;Rolling Stacey (2 wheels)       Vision Baseline Vision/History: 0 No visual deficits Patient Visual Report: No change from baseline       Perception         Praxis         Pertinent Vitals/Pain Pain Assessment Pain Assessment: No/denies pain     Extremity/Trunk Assessment Upper Extremity Assessment Upper Extremity Assessment: Generalized weakness   Lower Extremity Assessment Lower Extremity Assessment: Defer to PT evaluation   Cervical / Trunk Assessment Cervical / Trunk Assessment: Normal   Communication Communication Communication: No apparent difficulties   Cognition Arousal: Alert Behavior During Therapy: WFL for tasks assessed/performed Cognition: No apparent impairments                               Following commands: Intact       Cueing  General Comments  Cueing Techniques: Verbal cues      Exercises     Shoulder Instructions      Home Living Family/patient expects to be discharged to:: Private residence Living Arrangements: Other relatives Available Help at Discharge: Family;Available PRN/intermittently Type of Home: House Home Access: Ramped entrance     Home Layout: One level     Bathroom Shower/Tub: Chief Strategy Officer: Standard     Home Equipment: Agricultural consultant (2 wheels);Shower seat   Additional Comments: 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      Prior Functioning/Environment Prior Level of Function :  Independent/Modified Independent;Driving             Mobility Comments: uses RW because she doesn't want to break something not because she needs the support, doesn't use [place] in home ADLs Comments: indep    OT Problem List: Decreased strength;Decreased safety awareness;Decreased activity tolerance   OT Treatment/Interventions: Therapeutic exercise;Self-care/ADL training;Therapeutic activities;Patient/family education      OT Goals(Current goals can be found in the care plan section)   Acute Rehab OT Goals OT Goal Formulation: With patient Time For Goal Achievement: 05/31/24 Potential to Achieve Goals: Good   OT Frequency:  Min 2X/week    Co-evaluation              AM-PAC OT 6 Clicks Daily Activity     Outcome Measure Help from another person eating meals?: None Help from another person taking care of personal grooming?: A Little Help from another person toileting, which includes using toliet, bedpan, or urinal?: A Little Help from another person bathing (including washing, rinsing, drying)?: A Little Help from another person to put on and taking off regular upper body clothing?: A Little Help from another person to put on and taking off regular lower body clothing?: A Little 6 Click Score: 19   End of Session Equipment Utilized During Treatment: Rolling Cas[place] (2 wheels) Nurse Communication: Mobility status  Activity Tolerance: Patient tolerated treatment well Patient left: in chair (with patinet care tech)  OT Visit Diagnosis: Unsteadiness on feet (R26.81);Muscle weakness (generalized) (M62.81)                Time: 8987-8973 OT Time Calculation (min): 14 min Charges:  OT General Charges $OT Visit: 1 Visit OT Evaluation $OT Eval Moderate Complexity: 1 Mod Lamarr Pouch OT/L  Lamarr JONETTA Pouch 05/17/2024, 1:27 PM

## 2024-05-17 NOTE — Progress Notes (Signed)
 Patient refused the oxycodone  ordered per cardiology.

## 2024-05-17 NOTE — Progress Notes (Signed)
 Patient is complaining of 10/10 back pain radiating down to her left arm and not being able to breathe. O2 saturation is 97% on room air. Salah MD notified about the pain.  See new orders

## 2024-05-17 NOTE — Plan of Care (Signed)

## 2024-05-17 NOTE — Progress Notes (Signed)
 Advanced Heart Failure Rounding Note  Cardiologist: Redell Shallow, MD HF Cardiologist: Dr. Gardenia   Chief Complaint: VT  Subjective:    VT quiescent.   Feels better this morning. Eager to get up and move around. Denies CP/SOB.   Objective:   Weight Range: 87.2 kg Body mass index is 36.32 kg/m.   Vital Signs:   Temp:  [96.5 F (35.8 C)-98 F (36.7 C)] 96.5 F (35.8 C) (09/24 0744) Pulse Rate:  [78-85] 79 (09/24 0959) Resp:  [13-36] 18 (09/24 0959) BP: (97-138)/(55-82) 109/71 (09/24 0959) SpO2:  [91 %-100 %] 97 % (09/24 0959) Weight:  [87.2 kg] 87.2 kg (09/24 0456) Last BM Date : 05/15/24  Weight change: Filed Weights   05/15/24 0800 05/16/24 0500 05/17/24 0456  Weight: 87.5 kg 87 kg 87.2 kg    Intake/Output:   Intake/Output Summary (Last 24 hours) at 05/17/2024 1055 Last data filed at 05/16/2024 2217 Gross per 24 hour  Intake 320 ml  Output 750 ml  Net -430 ml    Physical Exam    General:  chronically ill appearing.  No respiratory difficulty Neck: JVD ~7 cm.  Cor: Regular rate & rhythm. No murmurs. Lungs: clear, diminished bases Extremities: no edema  Neuro: alert & oriented x 3. Affect pleasant.   Telemetry   AV paced 80s, short NSVT run (Personally reviewed)    Labs    CBC Recent Labs    05/16/24 0244 05/17/24 0246  WBC 7.2 8.3  HGB 12.9 12.8  HCT 39.1 39.3  MCV 98.5 97.5  PLT 280 289   Basic Metabolic Panel Recent Labs    90/78/74 1827 05/15/24 0247 05/16/24 0244 05/17/24 0246  NA 137   < > 138 139  K 3.6   < > 4.2 4.1  CL 103   < > 105 106  CO2 23   < > 24 24  GLUCOSE 145*   < > 121* 115*  BUN 13   < > 11 14  CREATININE 1.17*   < > 0.75 0.77  CALCIUM  9.1   < > 9.3 9.4  MG 2.6*  --   --   --    < > = values in this interval not displayed.   Liver Function Tests No results for input(s): AST, ALT, ALKPHOS, BILITOT, PROT, ALBUMIN in the last 72 hours.  No results for input(s): LIPASE, AMYLASE in the  last 72 hours.  Cardiac Enzymes No results for input(s): CKTOTAL, CKMB, CKMBINDEX, TROPONINI in the last 72 hours.  BNP: BNP (last 3 results) Recent Labs    12/20/23 1443  BNP 459.4*    ProBNP (last 3 results) No results for input(s): PROBNP in the last 8760 hours.   D-Dimer No results for input(s): DDIMER in the last 72 hours. Hemoglobin A1C No results for input(s): HGBA1C in the last 72 hours. Fasting Lipid Panel No results for input(s): CHOL, HDL, LDLCALC, TRIG, CHOLHDL, LDLDIRECT in the last 72 hours. Thyroid  Function Tests No results for input(s): TSH, T4TOTAL, T3FREE, THYROIDAB in the last 72 hours.  Invalid input(s): FREET3  Other results:   Imaging    No results found.   Medications:     Scheduled Medications:  amiodarone   400 mg Oral BID   carvedilol   3.125 mg Oral BID WC   Chlorhexidine  Gluconate Cloth  6 each Topical Daily   enoxaparin  (LOVENOX ) injection  40 mg Subcutaneous Daily   fluticasone  furoate-vilanterol  1 puff Inhalation Daily   furosemide   40  mg Oral Daily   hydrALAZINE   25 mg Oral Q8H   Influenza vac split trivalent PF  0.5 mL Intramuscular Tomorrow-1000   insulin  aspart  0-5 Units Subcutaneous QHS   insulin  aspart  0-6 Units Subcutaneous TID WC   isosorbide  mononitrate  30 mg Oral Daily   levothyroxine   112 mcg Oral Q0600   pantoprazole   40 mg Oral QAC breakfast   pravastatin   40 mg Oral Daily   sodium chloride  flush  3 mL Intravenous Q12H   spironolactone   25 mg Oral Daily    Infusions:    PRN Medications: acetaminophen , albuterol , ALPRAZolam , nitroGLYCERIN , mouth rinse, oxyCODONE , sodium chloride  flush    Patient Profile  Tanya Hogan is a 73 y.o. female with chronic HFrEF/nonischemic cardiomyopathy with EF 20%, VT on amiodarone , history of complete heart block now s/p BiV pacemaker, hypertension, hyperlipidemia, diabetes, breast cancer s/p mastectomy.  Presented with VT. Recent  episode of diverticulitis treated with ciprofloxacin  likely precipitating prolonged QTc/VT.  Assessment/Plan   VT - s/p cardioversion in ED - In setting of QT prolongation. Had been on cipro  for diverticulitis. Now off abx.  - Increased base rate to 80BPM due to frequent breakthrough ectopy.  - Continue PO amiodarone  - EP discussing options for device. May need extraction of old RV lead and new CRT-D system vs S-ICD in addition to current system. - Keep K > 4 and Mag > 2  Diverticulitis - Completed antibiotic course; no complaints.  - No fever or leukocytosis  Diabetes Mellitus:  -SSI  Hypertension:  - Now stable. Continue current regimen  HFrEF:  - TTE 10/30/23 w/ LVEF 20-25%, moderately reduced RV function - TTE on 05/13/24 with LVEF 20-25% -  Volume okay on exam. Continue lasix  40 mg daily. - GDMT limited by extensive allergy list (28 allergies).  - Continue coreg  3.125 mg BID - Continue hydralazine  25 mg TID + Continue Imdur  30 mg daily - Continue spironolactone  to 25 mg daily  Hypothyroidism:   - Continue levothryoxine 112 mcg   GERD: -Continue pantoprazole      Length of Stay: 4  Tanya LITTIE Coe, NP  05/17/2024, 10:55 AM  Advanced Heart Failure Team Pager [phone] (M-F; 7a - 5p)  Please contact [facility] for night-coverage after hours (5p -7a ) and weekends on [email or web address]

## 2024-05-17 NOTE — Progress Notes (Addendum)
 Patient Name: Tanya Hogan Date of Encounter: 05/17/2024  Primary Cardiologist: Redell Shallow, MD Electrophysiologist: Danelle Birmingham, MD  Interval Summary   Feeling fine currently. Earlier had chest pressure described as a foot pressing down on her chest.  No arrhythmia on tele to correlate.    Since then, has worked with PT without difficulty.   Vital Signs    Vitals:   05/17/24 0800 05/17/24 0830 05/17/24 0900 05/17/24 0959  BP: 124/67 123/71 125/68 109/71  Pulse: 79 80 80 79  Resp:  (!) 28 13 18   Temp:      TempSrc:      SpO2: 99% 96% 98% 97%  Weight:      Height:        Intake/Output Summary (Last 24 hours) at 05/17/2024 1049 Last data filed at 05/16/2024 2217 Gross per 24 hour  Intake 320 ml  Output 750 ml  Net -430 ml   Filed Weights   05/15/24 0800 05/16/24 0500 05/17/24 0456  Weight: 87.5 kg 87 kg 87.2 kg    Physical Exam    GEN- NAD, Alert and oriented  Lungs- Clear to ausculation bilaterally, normal work of breathing Cardiac- Regular rate and rhythm, no murmurs, rubs or gallops GI- soft, NT, ND, + BS Extremities- no clubbing or cyanosis. No edema  Telemetry    AV dual paced at 80, Short NSVT yesterday afternoon but otherwise quiescent. (personally reviewed)  Hospital Course     Tanya Hogan is a 73 y.o. female with a history of HTN, HLD, DM, LBBB, NICM, OSA w/CPAP, CHB, chronic CHF (systolic), w/CRT-P, PVCs, Abandoned RV lead, and breast Ca (s/p mastectomy) who is being seen today for the evaluation of VT at the request of Dr. Gardenia.   Assessment & Plan    #Sustained VT #NSVT/PVCs History of the same. Previously felt to be a poor upgrade candidate given co-morbidities and abandoned lead.  Potentially exacerbated by ciprofloxacin  which can cause QT prolongation.  Continue amiodarone  400 mg BID x 5 days, then 200 mg BID.   Dr. Kennyth has discussed with his partners. Potentially considering venogram and additional HV lead if vein remains  patent; Pt understands there would be a risk of stenosis post operatively; but overall less risk than an extraction.   If SVC is NOT patent, potentially would consider transfer to tertiary center to consider high risk extraction.     #CHB s/p Medtronic CRT-P V Paced >98% of the time.  Previously had RV threshold increase and underwent left bundle area RV lead, with abandonment of prior lead from 2003.    #Diverticulitis Treated with ABx starting 9/17. Now has completed course  Ciprofloxacin  can prolong QT, so may have been an aggravating factor.  May want more distance from her acute illness prior to considering invasive surgery - as long as her VT continues to improve.   For questions or updates, please contact [facility] Please consult [email or web address] for contact info under     Signed, Ozell Prentice Passey, PA-C  05/17/2024, 10:49 AM    I have seen, examined the patient, and reviewed the above assessment and plan.    I have seen, examined the patient, and reviewed the above assessment and plan.    Interval:  Transferred out of ICU to regular nursing floor. No acute overnight events. Patient reports feeling relatively well. No new or acute complaints.  No further episodes of ventricular arrhythmias.  General: Well developed, in no acute distress.  Neck: No JVD.  Cardiac: Normal rate, regular rhythm.  Resp: Normal work of breathing.  Ext: No edema.  Neuro: No gross focal deficits.  Psych: Normal affect.   Assessment:  Tanya Hogan is a 73 y.o. female with a history of HTN, HLD, DM, LBBB, NICM, OSA w/CPAP, CHB, chronic CHF (systolic), w/CRT-P, PVCs, Abandoned RV lead, and breast Ca (s/p mastectomy) who is being seen today for the evaluation of VT at the request of Dr. Gardenia.    Problem List:  Sustained monomorphic ventricular tachycardia Complete heart block status post CRT-P Chronic systolic heart failure   Plan:  - Patient meets criteria for secondary  prevention ICD given sustained monomorphic ventricular tachycardia and chronic systolic heart failure with LVEF less than 35% despite greater than 90 days guideline directed medical therapy.  She had ventricular tachycardia despite medical therapy with amiodarone .  Patient wants defibrillator therapies.  The approach to providing her with a defibrillator is a complicated one given that she already has a CRT-P and an abandoned RV pacing lead.  She was not felt to be an appropriate extraction candidate a few years ago when malfunctioning RV lead was abandoned and left bundle branch area pacing lead was implanted.  She remains a less than ideal candidate for device extraction given her comorbidities.  Patient is in favor of implanting an ICD lead without extraction of any existing leads, if there is patency of the left sided venous system.  We could implant a DF 1 ICD lead, cap the rate/sense component and instead utilize the existing left bundle branch area pacing lead as currently configured.  We will start by obtaining a venogram to assess for patency.  If vein is occluded, then options would be extraction of the left bundle branch area pacing lead, as this is the newest, with implantation of ICD lead versus implantation of a subcutaneous ICD.  The latter, however, would not allow for ATP. -Continue oral amiodarone  for VT suppression.   [name], MD 05/17/2024 8:26 PM

## 2024-05-18 ENCOUNTER — Encounter (HOSPITAL_COMMUNITY)
Admission: EM | Disposition: A | Discharge status: Home Health | Payer: Self-pay | Source: Home / Self Care | Attending: Cardiology

## 2024-05-18 ENCOUNTER — Ambulatory Visit: Admitting: Family Medicine

## 2024-05-18 ENCOUNTER — Inpatient Hospital Stay: Admitting: Family Medicine

## 2024-05-18 DIAGNOSIS — I429 Cardiomyopathy, unspecified: Secondary | ICD-10-CM

## 2024-05-18 DIAGNOSIS — I472 Ventricular tachycardia, unspecified: Secondary | ICD-10-CM | POA: Diagnosis not present

## 2024-05-18 HISTORY — PX: RIGHT/LEFT HEART CATH AND CORONARY ANGIOGRAPHY: CATH118266

## 2024-05-18 HISTORY — PX: UPPER EXTREMITY VENOGRAPHY: CATH118272

## 2024-05-18 LAB — POCT I-STAT EG7
Acid-Base Excess: 0 mmol/L (ref 0.0–2.0)
Acid-Base Excess: 2 mmol/L (ref 0.0–2.0)
Acid-base deficit: 6 mmol/L — ABNORMAL HIGH (ref 0.0–2.0)
Bicarbonate: 20.7 mmol/L (ref 20.0–28.0)
Bicarbonate: 24.5 mmol/L (ref 20.0–28.0)
Bicarbonate: 26.9 mmol/L (ref 20.0–28.0)
Calcium, Ion: 1.14 mmol/L — ABNORMAL LOW (ref 1.15–1.40)
Calcium, Ion: 1.22 mmol/L (ref 1.15–1.40)
Calcium, Ion: 1.29 mmol/L (ref 1.15–1.40)
HCT: 37 % (ref 36.0–46.0)
HCT: 39 % (ref 36.0–46.0)
HCT: 39 % (ref 36.0–46.0)
Hemoglobin: 12.6 g/dL (ref 12.0–15.0)
Hemoglobin: 13.3 g/dL (ref 12.0–15.0)
Hemoglobin: 13.3 g/dL (ref 12.0–15.0)
O2 Saturation: 59 %
O2 Saturation: 64 %
O2 Saturation: 69 %
Potassium: 3.7 mmol/L (ref 3.5–5.1)
Potassium: 4 mmol/L (ref 3.5–5.1)
Potassium: 4.5 mmol/L (ref 3.5–5.1)
Sodium: 118 mmol/L — CL (ref 135–145)
Sodium: 139 mmol/L (ref 135–145)
Sodium: 143 mmol/L (ref 135–145)
TCO2: 22 mmol/L (ref 22–32)
TCO2: 26 mmol/L (ref 22–32)
TCO2: 28 mmol/L (ref 22–32)
pCO2, Ven: 39.1 mmHg — ABNORMAL LOW (ref 44–60)
pCO2, Ven: 43.9 mmHg — ABNORMAL LOW (ref 44–60)
pCO2, Ven: 44.9 mmHg (ref 44–60)
pH, Ven: 7.272 (ref 7.25–7.43)
pH, Ven: 7.395 (ref 7.25–7.43)
pH, Ven: 7.405 (ref 7.25–7.43)
pO2, Ven: 33 mmHg (ref 32–45)
pO2, Ven: 35 mmHg (ref 32–45)
pO2, Ven: 36 mmHg (ref 32–45)

## 2024-05-18 LAB — POCT I-STAT 7, (LYTES, BLD GAS, ICA,H+H)
Acid-Base Excess: 1 mmol/L (ref 0.0–2.0)
Bicarbonate: 23.9 mmol/L (ref 20.0–28.0)
Calcium, Ion: 1.26 mmol/L (ref 1.15–1.40)
HCT: 38 % (ref 36.0–46.0)
Hemoglobin: 12.9 g/dL (ref 12.0–15.0)
O2 Saturation: 94 %
Potassium: 4.5 mmol/L (ref 3.5–5.1)
Sodium: 139 mmol/L (ref 135–145)
TCO2: 25 mmol/L (ref 22–32)
pCO2 arterial: 33.4 mmHg (ref 32–48)
pH, Arterial: 7.463 — ABNORMAL HIGH (ref 7.35–7.45)
pO2, Arterial: 68 mmHg — ABNORMAL LOW (ref 83–108)

## 2024-05-18 LAB — BASIC METABOLIC PANEL WITH GFR
Anion gap: 7 (ref 5–15)
BUN: 18 mg/dL (ref 8–23)
CO2: 25 mmol/L (ref 22–32)
Calcium: 9.8 mg/dL (ref 8.9–10.3)
Chloride: 106 mmol/L (ref 98–111)
Creatinine, Ser: 0.91 mg/dL (ref 0.44–1.00)
GFR, Estimated: >60 mL/min (ref >60)
Glucose, Bld: 110 mg/dL — ABNORMAL HIGH (ref 70–99)
Potassium: 4.4 mmol/L (ref 3.5–5.1)
Sodium: 138 mmol/L (ref 135–145)

## 2024-05-18 LAB — GLUCOSE, CAPILLARY
Glucose-Capillary: 101 mg/dL — ABNORMAL HIGH (ref 70–99)
Glucose-Capillary: 108 mg/dL — ABNORMAL HIGH (ref 70–99)
Glucose-Capillary: 86 mg/dL (ref 70–99)

## 2024-05-18 LAB — CULTURE, BLOOD (ROUTINE X 2)
Culture: NO GROWTH
Special Requests: ADEQUATE

## 2024-05-18 LAB — SURGICAL PCR SCREEN
MRSA, PCR: POSITIVE — AB
Staphylococcus aureus: POSITIVE — AB

## 2024-05-18 SURGERY — LEAD INSERTION

## 2024-05-18 SURGERY — RIGHT/LEFT HEART CATH AND CORONARY ANGIOGRAPHY
Anesthesia: LOCAL

## 2024-05-18 MED ORDER — LABETALOL HCL 5 MG/ML IV SOLN: 10.0000 mg | INTRAVENOUS | Status: AC | PRN

## 2024-05-18 MED ORDER — SODIUM CHLORIDE 0.9% FLUSH: 3.0000 mL | Freq: Two times a day (BID) | INTRAVENOUS | Status: DC

## 2024-05-18 MED ORDER — HYDRALAZINE HCL 20 MG/ML IJ SOLN: 10.0000 mg | INTRAMUSCULAR | Status: AC | PRN

## 2024-05-18 MED ORDER — SODIUM CHLORIDE 0.9% FLUSH: 3.0000 mL | INTRAVENOUS | Status: DC | PRN

## 2024-05-18 MED ORDER — CHLORHEXIDINE GLUCONATE 4 % EX SOLN
60.0000 mL | Freq: Once | CUTANEOUS | Status: AC
  Administered 2024-05-18: 4 via TOPICAL

## 2024-05-18 MED ORDER — HEPARIN SODIUM (PORCINE) 1000 UNIT/ML IJ SOLN
INTRAMUSCULAR | Status: DC | PRN
  Administered 2024-05-18: 5000 [IU] via INTRA_ARTERIAL

## 2024-05-18 MED ORDER — LIDOCAINE HCL (PF) 1 % IJ SOLN
INTRAMUSCULAR | Status: DC | PRN
  Administered 2024-05-18 (×3): 2 mL

## 2024-05-18 MED ORDER — SODIUM CHLORIDE 0.9 % IV SOLN: INTRAVENOUS | Status: DC

## 2024-05-18 MED ORDER — ONDANSETRON HCL 4 MG/2ML IJ SOLN
4.0000 mg | Freq: Four times a day (QID) | INTRAMUSCULAR | Status: DC | PRN
  Administered 2024-05-19 – 2024-05-21 (×2): 4 mg via INTRAVENOUS
  Filled 2024-05-18 (×2): qty 2

## 2024-05-18 MED ORDER — MIDAZOLAM HCL 2 MG/2ML IJ SOLN
INTRAMUSCULAR | Status: DC | PRN
  Administered 2024-05-18: 1 mg via INTRAVENOUS

## 2024-05-18 MED ORDER — VERAPAMIL HCL 2.5 MG/ML IV SOLN
INTRAVENOUS | Status: DC | PRN
  Administered 2024-05-18: 10 mL via INTRA_ARTERIAL

## 2024-05-18 MED ORDER — CHLORHEXIDINE GLUCONATE 4 % EX SOLN: 60.0000 mL | Freq: Once | CUTANEOUS | Status: DC

## 2024-05-18 MED ORDER — FREE WATER
500.0000 mL | Freq: Once | Status: AC
  Administered 2024-05-18: 500 mL via ORAL

## 2024-05-18 MED ORDER — SODIUM CHLORIDE 0.9% FLUSH
3.0000 mL | Freq: Two times a day (BID) | INTRAVENOUS | Status: DC
  Administered 2024-05-18 – 2024-05-23 (×9): 3 mL via INTRAVENOUS

## 2024-05-18 MED ORDER — FENTANYL CITRATE (PF) 100 MCG/2ML IJ SOLN
INTRAMUSCULAR | Status: DC | PRN
  Administered 2024-05-18: 25 ug via INTRAVENOUS

## 2024-05-18 MED ORDER — LIDOCAINE HCL (PF) 1 % IJ SOLN
INTRAMUSCULAR | Status: AC
  Filled 2024-05-18: qty 30

## 2024-05-18 MED ORDER — MIDAZOLAM HCL 2 MG/2ML IJ SOLN
INTRAMUSCULAR | Status: AC
  Filled 2024-05-18: qty 2

## 2024-05-18 MED ORDER — SODIUM CHLORIDE 0.9 % IV SOLN: 250.0000 mL | INTRAVENOUS | Status: AC | PRN

## 2024-05-18 MED ORDER — VERAPAMIL HCL 2.5 MG/ML IV SOLN
INTRAVENOUS | Status: AC
  Filled 2024-05-18: qty 2

## 2024-05-18 MED ORDER — VANCOMYCIN HCL IN DEXTROSE 1-5 GM/200ML-% IV SOLN
1000.0000 mg | INTRAVENOUS | Status: DC
  Filled 2024-05-18: qty 200

## 2024-05-18 MED ORDER — HEPARIN (PORCINE) IN NACL 1000-0.9 UT/500ML-% IV SOLN
INTRAVENOUS | Status: DC | PRN
  Administered 2024-05-18 (×2): 500 mL

## 2024-05-18 MED ORDER — SODIUM CHLORIDE 0.9 % IV SOLN
80.0000 mg | INTRAVENOUS | Status: DC
  Filled 2024-05-18: qty 2

## 2024-05-18 MED ORDER — SODIUM CHLORIDE 0.9 % IV SOLN: 250.0000 mL | INTRAVENOUS | Status: DC | PRN

## 2024-05-18 MED ORDER — FENTANYL CITRATE (PF) 100 MCG/2ML IJ SOLN
INTRAMUSCULAR | Status: AC
  Filled 2024-05-18: qty 2

## 2024-05-18 MED ORDER — SODIUM CHLORIDE 0.9% FLUSH
3.0000 mL | Freq: Two times a day (BID) | INTRAVENOUS | Status: DC
  Administered 2024-05-18 – 2024-05-24 (×11): 3 mL via INTRAVENOUS

## 2024-05-18 MED ORDER — IOHEXOL 350 MG/ML SOLN
INTRAVENOUS | Status: DC | PRN
  Administered 2024-05-18: 40 mL

## 2024-05-18 MED ORDER — HEPARIN SODIUM (PORCINE) 1000 UNIT/ML IJ SOLN
INTRAMUSCULAR | Status: AC
  Filled 2024-05-18: qty 10

## 2024-05-18 SURGICAL SUPPLY — 17 items
CATH BALLN WEDGE 5F 110CM (CATHETERS) IMPLANT
CATH INFINITI 5FR ANG PIGTAIL (CATHETERS) IMPLANT
CATH INFINITI AMBI 6FR TG (CATHETERS) IMPLANT
DEVICE RAD COMP TR BAND LRG (VASCULAR PRODUCTS) IMPLANT
GLIDESHEATH SLEND SS 6F .021 (SHEATH) IMPLANT
GUIDEWIRE .025 260CM (WIRE) IMPLANT
KIT ESSENTIALS PG (KITS) IMPLANT
KIT SYRINGE INJ CVI SPIKEX1 (MISCELLANEOUS) IMPLANT
PACK CARDIAC CATHETERIZATION (CUSTOM PROCEDURE TRAY) ×2 IMPLANT
SET ATX-X65L (MISCELLANEOUS) IMPLANT
SHEATH GLIDE SLENDER 4/5FR (SHEATH) IMPLANT
SHEATH PINNACLE 5F 10CM (SHEATH) IMPLANT
SHEATH PROBE COVER 6X72 (BAG) IMPLANT
WIRE EMERALD 3MM-J .025X260CM (WIRE) IMPLANT
WIRE EMERALD 3MM-J .035X260CM (WIRE) IMPLANT
WIRE HI TORQ VERSACORE-J 145CM (WIRE) IMPLANT
WIRE MICRO SET SILHO 5FR 7 (SHEATH) IMPLANT

## 2024-05-18 NOTE — Progress Notes (Signed)
   Informed Consent   Shared Decision Making/Informed Consent The risks [stroke (1 in 1000), death (1 in 1000), kidney failure [usually temporary] (1 in 500), bleeding (1 in 200), allergic reaction [possibly serious] (1 in 200)], benefits (diagnostic support and management of coronary artery disease) and alternatives of a cardiac catheterization were discussed in detail with Ms. Klontz and she is willing to proceed.     Artist Pouch, PA-C

## 2024-05-18 NOTE — Plan of Care (Signed)

## 2024-05-18 NOTE — Progress Notes (Addendum)
  Patient Name: Tanya Hogan Date of Encounter: 05/18/2024  Primary Cardiologist: Redell Shallow, MD Electrophysiologist: Danelle Birmingham, MD  Interval Summary   Feeling OK this am.   Vital Signs    Vitals:   05/17/24 2302 05/18/24 0300 05/18/24 0545 05/18/24 0731  BP: 128/85 (!) 119/95 122/67 119/60  Pulse: 79 80  80  Resp: 18 18  19   Temp: 97.8 F (36.6 C) 97.8 F (36.6 C)  97.8 F (36.6 C)  TempSrc: Oral Oral  Oral  SpO2: 99% 99%  98%  Weight:  88 kg    Height:        Intake/Output Summary (Last 24 hours) at 05/18/2024 0848 Last data filed at 05/18/2024 0827 Gross per 24 hour  Intake 920 ml  Output --  Net 920 ml   Filed Weights   05/16/24 0500 05/17/24 0456 05/18/24 0300  Weight: 87 kg 87.2 kg 88 kg    Physical Exam    GEN- NAD, Alert and oriented  Lungs- Clear to ausculation bilaterally, normal work of breathing Cardiac- Regular rate and rhythm, no murmurs, rubs or gallops GI- soft, NT, ND, + BS Extremities- no clubbing or cyanosis. No edema  Telemetry    AV dual paced at 80 (personally reviewed)  Hospital Course    Tanya Hogan is a 73 y.o. female with a history of HTN, HLD, DM, LBBB, NICM, OSA w/CPAP, CHB, chronic CHF (systolic), w/CRT-P, PVCs, Abandoned RV lead, and breast Ca (s/p mastectomy) who is being seen today for the evaluation of VT at the request of Dr. Gardenia.   Assessment & Plan    #Sustained VT #NSVT/PVCs History of the same. Previously felt to be a poor upgrade candidate given co-morbidities and abandoned lead.  Potentially exacerbated by ciprofloxacin  which can cause QT prolongation.  Continue amiodarone  400 mg BID x 5 days, then 200 mg BID.   Plan for venogram this afternoon, If patent plan addition of a St Jude HV lead.   If SVC is NOT patent, potentially would consider transfer to tertiary center to consider high risk extraction.   Explained risks, benefits, and alternatives to ICD lead revision, including but not limited  to bleeding, infection, pneumothorax, pericardial effusion, lead dislodgement, heart attack, stroke, or death.  Pt verbalized understanding and agrees to proceed.       #CHB s/p Medtronic CRT-P V Paced >98% of the time.  Previously had RV threshold increase and underwent left bundle area RV lead, with abandonment of prior lead from 2003.    #Diverticulitis Treated with ABx starting 9/17. Now has completed course  Ciprofloxacin  can prolong QT, so may have been an aggravating factor.     For questions or updates, please contact [facility] Please consult [email or web address] for contact info under     Signed, Ozell Prentice Passey, PA-C  05/18/2024, 8:48 AM

## 2024-05-18 NOTE — Interval H&P Note (Signed)
 History and Physical Interval Note:  05/18/2024 2:26 PM  Tanya Hogan  has presented today for surgery, with the diagnosis of nstemi.  The various methods of treatment have been discussed with the patient and family. After consideration of risks, benefits and other options for treatment, the patient has consented to  Procedure(s): RIGHT/LEFT HEART CATH AND CORONARY ANGIOGRAPHY (N/A) as a surgical intervention.  The patient's history has been reviewed, patient examined, no change in status, stable for surgery.  I have reviewed the patient's chart and labs.  Questions were answered to the patient's satisfaction.     [name] K Ermalinda Joubert

## 2024-05-18 NOTE — Discharge Summary (Incomplete)
 ELECTROPHYSIOLOGY PROCEDURE DISCHARGE SUMMARY    Patient ID: Tanya Hogan,  MRN: [id number], DOB/AGE: 75-Feb-1952 75 y.o.  Admit date: 05/12/2024 Discharge date: 05/19/2024  Primary Care Physician: [name], MD  Primary Cardiologist: Redell Shallow, MD  Electrophysiologist: Dr. Waddell  -> Dr. Kennyth  Primary Diagnosis:  Sustained Ventricular tachycardia, Monomorphic PVCs  Secondary Diagnosis: Likely SVC stenosis CHB  Recent diverticulitis Chronic systolic CHF DM2 COPD HLD  Allergies  Allergen Reactions   Injectafer [Ferric Carboxymaltose] Shortness Of Breath    Mild SOB following injectafer infusion Mild SOB following injectafer infusion    Penicillins Anaphylaxis and Rash    Has patient had a PCN reaction causing immediate rash, facial/tongue/throat swelling, SOB or lightheadedness with hypotension: Yes Has patient had a PCN reaction causing severe rash involving mucus membranes or skin necrosis: No Has patient had a PCN reaction that required hospitalization: No Has patient had a PCN reaction occurring within the last 10 years: No If all of the above answers are NO, then may proceed with Cephalosporin use. Stuttered with medication     Accupril [Quinapril Hcl] Other (See Comments)    Hyperkalemia=high potassium level   Cozaar  [Losartan  Potassium] Itching   Dml Forte Rash   Entresto [Sacubitril-Valsartan] Other (See Comments)    Hyperkalemia   Flonase  [Fluticasone ] Other (See Comments)    Nasal ulcer.     Lactose Intolerance (Gi) Other (See Comments)    Increased calcium , sneezing, nasal issues   Lipitor [Atorvastatin ] Other (See Comments)    muscle aches/pains   Nasonex  [Mometasone  Furoate] Other (See Comments)    nosebleed   Nsaids Other (See Comments)    Contraindication due to ulcers and coughing up blood    Aspirin  Other (See Comments)    Pt states burns holes in stomach, ulcers    Baking Soda-Fluoride [Sodium Fluoride] Itching  and Rash   Chamomile Hives   Clindamycin /Lincomycin Rash   Codeine Nausea And Vomiting   Furosemide  Other (See Comments)    Ears ring, drops bp quickly. Is okay to take smaller doses.    Lanolin-Petrolatum Rash   Miralax  [Polyethylene Glycol] Rash   Quinine Rash   Reclast  [Zoledronic  Acid] Rash and Other (See Comments)    shaking   Rifampin Other (See Comments)    DOES NOT REMEMBER THIS MEDICATION OR ALLERGY   Singulair  [Montelukast ] Other (See Comments)    dizziness   Sulfa Antibiotics Rash   Tramadol  Rash and Other (See Comments)    Anxiety   Vesicare  [Solifenacin ] Other (See Comments)    stopped me from urinating   Wellbutrin  [Bupropion ] Other (See Comments)    Muscle aches, pain, made my skin crawl   Wool Alcohol [Lanolin] Rash     Procedures This Admission:  Echo 05/13/2024 LVEF 20-25%, global HK, Grade II DD, Severe LAE, Severe RAE, mild MR, mild to mod TV  L/RHC + LUE Venogram 05/18/2024 1.  Normal right dominant circulation with no obstructive coronary artery disease. 2.  Venography from the left axillary demonstrated that the subclavian vein proximal to the insertion site of multiple leads is patent however a wire and catheter could not cross the this confluence. 3.  Fick cardiac output of 5.7 L/min and Fick cardiac index of 3.1 L/min/m with following hemodynamics:            Right atrial pressure mean of 8 mmHg            Right ventricular pressure 52/1 with an end-diastolic pressure of  10 mmHg            Wedge pressure mean of 23 mmHg with V waves to 29 mmHg            PA pressure of 51/27 with mean of 37 mmHg            PVR of PVR of 2.5 Woods units            PA pulsatility index of 3 4.  LVEDP of 21 to 24 mmHg across the cardiac cycle      Brief HPI: Tanya Hogan is a 73 y.o. female was admitted for sustained VT and consulted by electrophysiology  for consideration of ICD upgrade.  Past medical history includes above.  The patient has persistent LV  dysfunction despite guideline directed therapy and has had sustained Venticular tachycardia.  Hospital Course:  The patient was admitted for abdominal pain, nausea and vomiting in the setting of recent admission at [facility] for Diverticulitis.  She was found to be in [facility] on arrival consistent with VT.  PPM interrogation showed that she had been in VT ~ 185 bpm for approximately 4 hours. She underwent urgent cardioversion and reloaded with IV amiodarone .  EP brought on board given recurrent sustained VT and need to consider upgrade to her device. Complicated by previous abandoned lead.  Pt transitioned to po amiodarone  with stability. Given prolonged MMVT and low EF taken for updated L/RHC as above.   Pt remained quiescent on amiodarone  and transitioned to po.   On 9/25 underwent L/RHC with LUE venogram as above. Felt to be high risk extraction candidate given her co-morbidities.      Anticoagulation resumption This patient is not on anticoagulation.  Physical Exam: Vitals:   05/19/24 0756 05/19/24 0759 05/19/24 1209 05/19/24 1427  BP: 114/63  (!) 102/58 (!) 109/58  Pulse: 79 81 84   Resp: 18  18   Temp: 97.8 F (36.6 C)  98.1 F (36.7 C)   TempSrc: Oral  Oral   SpO2: 98% 99% 98%   Weight:      Height:        GEN- NAD. A&O x 3.  HEENT: Normocephalic, atraumatic Lungs- CTAB, normal effort.  Heart- RRR. No M/G/R.  GI- Soft, NT, ND.  Extremities- No clubbing, cyanosis, or edema Skin- Warm and dry, no rash or lesion. ICD site ***   Discharge Medications:  Allergies as of 05/19/2024       Reactions   Injectafer [ferric Carboxymaltose] Shortness Of Breath   Mild SOB following injectafer infusion Mild SOB following injectafer infusion   Penicillins Anaphylaxis, Rash   Has patient had a PCN reaction causing immediate rash, facial/tongue/throat swelling, SOB or lightheadedness with hypotension: Yes Has patient had a PCN reaction causing severe rash involving mucus membranes or skin  necrosis: No Has patient had a PCN reaction that required hospitalization: No Has patient had a PCN reaction occurring within the last 10 years: No If all of the above answers are NO, then may proceed with Cephalosporin use. Stuttered with medication   Accupril [quinapril Hcl] Other (See Comments)   Hyperkalemia=high potassium level   Cozaar  [losartan  Potassium] Itching   Dml Forte Rash   Entresto [sacubitril-valsartan] Other (See Comments)   Hyperkalemia   Flonase  [fluticasone ] Other (See Comments)   Nasal ulcer.     Lactose Intolerance (gi) Other (See Comments)   Increased calcium , sneezing, nasal issues   Lipitor [atorvastatin ] Other (See Comments)   muscle aches/pains  Nasonex  [mometasone  Furoate] Other (See Comments)   nosebleed   Nsaids Other (See Comments)   Contraindication due to ulcers and coughing up blood    Aspirin  Other (See Comments)   Pt states burns holes in stomach, ulcers    Baking Soda-fluoride [sodium Fluoride] Itching, Rash   Chamomile Hives   Clindamycin /lincomycin Rash   Codeine Nausea And Vomiting   Furosemide  Other (See Comments)   Ears ring, drops bp quickly. Is okay to take smaller doses.    Lanolin-petrolatum Rash   Miralax  [polyethylene Glycol] Rash   Quinine Rash   Reclast  [zoledronic  Acid] Rash, Other (See Comments)   shaking   Rifampin Other (See Comments)   DOES NOT REMEMBER THIS MEDICATION OR ALLERGY   Singulair  [montelukast ] Other (See Comments)   dizziness   Sulfa Antibiotics Rash   Tramadol  Rash, Other (See Comments)   Anxiety   Vesicare  [solifenacin ] Other (See Comments)   stopped me from urinating   Wellbutrin  [bupropion ] Other (See Comments)   Muscle aches, pain, made my skin crawl   Wool Alcohol [lanolin] Rash     Med Rec must be completed prior to using this SMARTLINK***       Disposition: {Blank single:19197::Home,SNF,CIR} with usual follow up as in AVS  Duration of Discharge Encounter:  APP time:  *** minutes  Signed, Ozell Prentice Passey, PA-C  05/19/2024 3:27 PM

## 2024-05-18 NOTE — Progress Notes (Signed)
 Mobility Specialist Progress Note;   05/18/24 1014  Mobility  Activity Ambulated with assistance  Level of Assistance Standby assist, set-up cues, supervision of patient - no hands on  Assistive Device Front wheel [employer]  Distance Ambulated (ft) 375 ft  Activity Response Tolerated well  Mobility Referral Yes  Mobility visit 1 Mobility  Mobility Specialist Start Time (ACUTE ONLY) 1014  Mobility Specialist Stop Time (ACUTE ONLY) 1031  Mobility Specialist Time Calculation (min) (ACUTE ONLY) 17 min   Pt in chair upon arrival, agreeable to mobility. Required no physical assistance during ambulation, SV for safety. Took 2x standing rest breaks d/t overall fatigue. VSS throughout. Pt safely returned back to bed and left with all needs met, alarm on. Case manager in room.   Lauraine Erm Mobility Specialist Please contact via SecureChat or [employer] [phone]

## 2024-05-18 NOTE — Progress Notes (Signed)
 Advanced Heart Failure Rounding Note  Cardiologist: Redell Shallow, MD HF Cardiologist: Dr. Gardenia   Chief Complaint: VT  Subjective:    VT quiescent.   Feels good this morning.   Objective:   Weight Range: 88 kg Body mass index is 36.66 kg/m.   Vital Signs:   Temp:  [97.6 F (36.4 C)-97.8 F (36.6 C)] 97.8 F (36.6 C) (09/25 0731) Pulse Rate:  [79-80] 80 (09/25 0731) Resp:  [18-30] 19 (09/25 0731) BP: (112-128)/(60-95) 126/65 (09/25 1040) SpO2:  [94 %-99 %] 98 % (09/25 0731) Weight:  [88 kg] 88 kg (09/25 0300) Last BM Date : 05/18/24  Weight change: Filed Weights   05/16/24 0500 05/17/24 0456 05/18/24 0300  Weight: 87 kg 87.2 kg 88 kg    Intake/Output:   Intake/Output Summary (Last 24 hours) at 05/18/2024 1148 Last data filed at 05/18/2024 0827 Gross per 24 hour  Intake 720 ml  Output 200 ml  Net 520 ml    Physical Exam    General:  chronically ill appearing.  No respiratory difficulty Neck: JVD flat.  Cor: Regular rate & rhythm. No murmurs. Lungs: clear, diminished bases Extremities: no edema  Neuro: alert & oriented x 3. Affect pleasant.   Telemetry   AV paced 80s (Personally reviewed)    Labs    CBC Recent Labs    05/16/24 0244 05/17/24 0246  WBC 7.2 8.3  HGB 12.9 12.8  HCT 39.1 39.3  MCV 98.5 97.5  PLT 280 289   Basic Metabolic Panel Recent Labs    90/75/74 0246 05/18/24 0340  NA 139 138  K 4.1 4.4  CL 106 106  CO2 24 25  GLUCOSE 115* 110*  BUN 14 18  CREATININE 0.77 0.91  CALCIUM  9.4 9.8  MG 2.0  --    Liver Function Tests No results for input(s): AST, ALT, ALKPHOS, BILITOT, PROT, ALBUMIN in the last 72 hours.  No results for input(s): LIPASE, AMYLASE in the last 72 hours.  Cardiac Enzymes No results for input(s): CKTOTAL, CKMB, CKMBINDEX, TROPONINI in the last 72 hours.  BNP: BNP (last 3 results) Recent Labs    12/20/23 1443  BNP 459.4*    ProBNP (last 3 results) No results  for input(s): PROBNP in the last 8760 hours.   D-Dimer No results for input(s): DDIMER in the last 72 hours. Hemoglobin A1C No results for input(s): HGBA1C in the last 72 hours. Fasting Lipid Panel No results for input(s): CHOL, HDL, LDLCALC, TRIG, CHOLHDL, LDLDIRECT in the last 72 hours. Thyroid  Function Tests No results for input(s): TSH, T4TOTAL, T3FREE, THYROIDAB in the last 72 hours.  Invalid input(s): FREET3  Other results:   Imaging    No results found.   Medications:     Scheduled Medications:  amiodarone   400 mg Oral BID   Followed by   NOREEN ON 05/22/2024] amiodarone   200 mg Oral BID   carvedilol   3.125 mg Oral BID WC   chlorhexidine   60 mL Topical Once   chlorhexidine   60 mL Topical Once   fluticasone  furoate-vilanterol  1 puff Inhalation Daily   furosemide   40 mg Oral Daily   gentamicin  (GARAMYCIN ) 80 mg in sodium chloride  0.9 % 500 mL irrigation  80 mg Irrigation On Call   hydrALAZINE   25 mg Oral Q8H   Influenza vac split trivalent PF  0.5 mL Intramuscular Tomorrow-1000   insulin  aspart  0-5 Units Subcutaneous QHS   insulin  aspart  0-6 Units Subcutaneous TID WC  isosorbide  mononitrate  30 mg Oral Daily   levothyroxine   112 mcg Oral Q0600   mupirocin  ointment  1 Application Nasal BID   pantoprazole   40 mg Oral QAC breakfast   pravastatin   40 mg Oral Daily   sodium chloride  flush  3 mL Intravenous Q12H   sodium chloride  flush  3 mL Intravenous Q12H   spironolactone   25 mg Oral Daily    Infusions:  sodium chloride      vancomycin        PRN Medications: acetaminophen , albuterol , ALPRAZolam , nitroGLYCERIN , mouth rinse, oxyCODONE , sodium chloride  flush, sodium chloride  flush    Patient Profile  Tanya Hogan is a 73 y.o. female with chronic HFrEF/nonischemic cardiomyopathy with EF 20%, VT on amiodarone , history of complete heart block now s/p BiV pacemaker, hypertension, hyperlipidemia, diabetes, breast cancer s/p  mastectomy.  Presented with VT. Recent episode of diverticulitis treated with ciprofloxacin  likely precipitating prolonged QTc/VT.  Assessment/Plan   VT - s/p cardioversion in ED - In setting of QT prolongation. Had been on cipro  for diverticulitis. Now off abx.  - Increased base rate to 80BPM due to frequent breakthrough ectopy.  - Continue PO amiodarone  - EP discussing options for device. May need extraction of old RV lead and new CRT-D system vs S-ICD in addition to current system. - Keep K > 4 and Mag > 2  Diverticulitis - Completed antibiotic course; no complaints.  - No fever or leukocytosis  Diabetes Mellitus:  -SSI  Hypertension:  - Now stable. Continue current regimen  HFrEF:  - TTE 10/30/23 w/ LVEF 20-25%, moderately reduced RV function - TTE on 05/13/24 with LVEF 20-25% -  Volume okay on exam. Continue lasix  40 mg daily. - GDMT limited by extensive allergy list (28 allergies).  - Continue coreg  3.125 mg BID - Continue hydralazine  25 mg TID + Continue Imdur  30 mg daily - Continue spironolactone  to 25 mg daily  Hypothyroidism:   - Continue levothryoxine 112 mcg   GERD: -Continue pantoprazole    Will arrange f/u in AHF clinic. AHF team to sign off at this time.   AHF meds at discharge:  Amiodarone  taper per EP Coreg  3.125 mg BID Lasix  40 mg daily Hydralazine  25 mg TID Imdur  30 mg daily Spiro 25 mg daily Pravastatin  40 mg daily  Length of Stay: 5  Beckey LITTIE Coe, NP  05/18/2024, 11:48 AM  Advanced Heart Failure Team Pager [phone] (M-F; 7a - 5p)  Please contact [facility] for night-coverage after hours (5p -7a ) and weekends on [email or web address]

## 2024-05-18 NOTE — Progress Notes (Signed)
 Surgical PCR Swab collected and sent to lab at 1144.

## 2024-05-18 NOTE — Progress Notes (Signed)
 Pre-procedure CHG bath complete. Patient's gown changed.

## 2024-05-18 NOTE — TOC Progression Note (Signed)
 Transition of Care [facility]) - Progression Note    Patient Details  Name: Tanya Hogan MRN: [id number] Date of Birth: 1950/11/12  Transition of Care [facility]) CM/SW Contact  Graves-Bigelow, Erminio Deems, RN Phone Number: 05/18/2024, 11:56 AM  Clinical Narrative: Inpatient Case Manager spoke with patient and she wants an aide to assist with a bath in the home. Patient feels that she will benefit from [facility] PT; she does not want to be deconditioned once she gets back home. Patient is agreeable to home health with [facility]. Inpatient Case Manager did submit referral to [facility]. Start of care to begin within 24-48 hours post transition home. No further needs identified.   Expected Discharge Plan: Home w Home Health Services Barriers to Discharge: No Barriers Identified  Expected Discharge Plan and Services In-house Referral: Clinical Social Work Discharge Planning Services: CM Consult Post Acute Care Choice: Home Health                    DME Agency: NA       HH Arranged: PT, Nurse's Aide HH Agency: [facility] Date HH Agency Contacted: 05/18/24 Time HH Agency Contacted: 1155 Representative spoke with at [facility] Agency: [place]   Social Drivers of Health (SDOH) Interventions SDOH Screenings   Food Insecurity: No Food Insecurity (05/13/2024)  Housing: Low Risk  (05/13/2024)  Transportation Needs: No Transportation Needs (05/13/2024)  Utilities: Not At Risk (05/13/2024)  Alcohol Screen: Low Risk  (08/10/2023)  Depression (PHQ2-9): Low Risk  (02/18/2024)  Financial Resource Strain: Medium Risk (08/10/2023)  Physical Activity: Sufficiently Active (08/10/2023)  Recent Concern: Physical Activity - Insufficiently Active (07/12/2023)  Social Connections: Moderately Integrated (05/13/2024)  Stress: No Stress Concern Present (05/08/2024)   Received from [facility]  Tobacco Use: Medium Risk (05/12/2024)  Health Literacy: Adequate Health Literacy (08/10/2023)    Readmission Risk  Interventions     No data to display

## 2024-05-18 NOTE — Progress Notes (Signed)
 Patient needs IV in left arm for planned procedure today. Primary RN attempted IV placement in patient's left AC. Initial blood return noted but ultimately IV placement was unsuccessful. Patient is refusing any IV attempts in her hand and in her wrist area due to neuropathy pain, thus increasing difficultly of IV placement. IV team consulted.

## 2024-05-18 NOTE — H&P (View-Only) (Signed)
   Informed Consent   Shared Decision Making/Informed Consent The risks [stroke (1 in 1000), death (1 in 1000), kidney failure [usually temporary] (1 in 500), bleeding (1 in 200), allergic reaction [possibly serious] (1 in 200)], benefits (diagnostic support and management of coronary artery disease) and alternatives of a cardiac catheterization were discussed in detail with Ms. Klontz and she is willing to proceed.     Artist Pouch, PA-C

## 2024-05-19 ENCOUNTER — Telehealth: Payer: Self-pay

## 2024-05-19 ENCOUNTER — Encounter (HOSPITAL_COMMUNITY): Payer: Self-pay | Admitting: Internal Medicine

## 2024-05-19 DIAGNOSIS — I472 Ventricular tachycardia, unspecified: Secondary | ICD-10-CM | POA: Diagnosis not present

## 2024-05-19 LAB — GLUCOSE, CAPILLARY
Glucose-Capillary: 127 mg/dL — ABNORMAL HIGH (ref 70–99)
Glucose-Capillary: 107 mg/dL — ABNORMAL HIGH (ref 70–99)
Glucose-Capillary: 142 mg/dL — ABNORMAL HIGH (ref 70–99)
Glucose-Capillary: 118 mg/dL — ABNORMAL HIGH (ref 70–99)
Glucose-Capillary: 102 mg/dL — ABNORMAL HIGH (ref 70–99)

## 2024-05-19 LAB — BASIC METABOLIC PANEL WITH GFR
Anion gap: 13 (ref 5–15)
BUN: 15 mg/dL (ref 8–23)
CO2: 23 mmol/L (ref 22–32)
Calcium: 10.4 mg/dL — ABNORMAL HIGH (ref 8.9–10.3)
Chloride: 102 mmol/L (ref 98–111)
Creatinine, Ser: 0.8 mg/dL (ref 0.44–1.00)
GFR, Estimated: >60 mL/min (ref >60)
Glucose, Bld: 110 mg/dL — ABNORMAL HIGH (ref 70–99)
Potassium: 4.7 mmol/L (ref 3.5–5.1)
Sodium: 138 mmol/L (ref 135–145)

## 2024-05-19 MED ORDER — POTASSIUM CHLORIDE CRYS ER 20 MEQ PO TBCR
20.0000 meq | EXTENDED_RELEASE_TABLET | Freq: Once | ORAL | Status: AC
  Administered 2024-05-19: 20 meq via ORAL
  Filled 2024-05-19: qty 1

## 2024-05-19 MED ORDER — FUROSEMIDE 10 MG/ML IJ SOLN
60.0000 mg | Freq: Once | INTRAMUSCULAR | Status: AC
  Administered 2024-05-19: 60 mg via INTRAVENOUS
  Filled 2024-05-19: qty 6

## 2024-05-19 MED ORDER — MEXILETINE HCL 150 MG PO CAPS
150.0000 mg | ORAL_CAPSULE | Freq: Two times a day (BID) | ORAL | Status: DC
  Administered 2024-05-19 – 2024-05-24 (×10): 150 mg via ORAL
  Filled 2024-05-19 (×11): qty 1

## 2024-05-19 MED ORDER — FUROSEMIDE 40 MG PO TABS: 40.0000 mg | ORAL_TABLET | Freq: Every day | ORAL | Status: DC

## 2024-05-19 MED ORDER — TORSEMIDE 20 MG PO TABS
20.0000 mg | ORAL_TABLET | Freq: Every day | ORAL | Status: DC
  Administered 2024-05-20 – 2024-05-24 (×5): 20 mg via ORAL
  Filled 2024-05-19 (×5): qty 1

## 2024-05-19 NOTE — Progress Notes (Signed)
   Update to plan:   Preliminary discussion with [facility] is next available extraction would be Friday in 1 week.   Waiting on second call to hear if feasible to do next week and more on timing.  If they think she has a reasonable chance of going next week, will transfer.   If they think the chance is low, or will be even longer - Have discussed with pt LifeVest as bridge to Extraction and ICD upgrade, she is agreeable.    Lifevest orders faxed.  Rep is aware if transferred, may not need.   Ozell [name], PA-C  05/19/2024 4:16 PM

## 2024-05-19 NOTE — Progress Notes (Signed)
  Quick Look, Quick EGM, and EKG of her VT.

## 2024-05-19 NOTE — Progress Notes (Addendum)
 Patient Name: Tanya Hogan Date of Encounter: 05/19/2024  Primary Cardiologist: Redell Shallow, MD Electrophysiologist: Danelle Birmingham, MD  Interval Summary   Cath yesterday with no significant CAD.  Venogram concerning for occluded SVC, could not pass a small wire through.   Feeling OK this am. No new concerns.   Vital Signs    Vitals:   05/18/24 2251 05/19/24 0103 05/19/24 0350 05/19/24 0457  BP:  (!) 117/59 129/66   Pulse:  80    Resp: 16 18 20    Temp:  98.3 F (36.8 C) (!) 97.5 F (36.4 C)   TempSrc:  Oral Oral   SpO2:  95%  98%  Weight:   85.4 kg   Height:        Intake/Output Summary (Last 24 hours) at 05/19/2024 0743 Last data filed at 05/18/2024 1839 Gross per 24 hour  Intake 620 ml  Output 1020 ml  Net -400 ml   Filed Weights   05/17/24 0456 05/18/24 0300 05/19/24 0350  Weight: 87.2 kg 88 kg 85.4 kg    Physical Exam    GEN- NAD, Alert and oriented  Lungs- Clear to ausculation bilaterally, normal work of breathing Cardiac- Regular rate and rhythm, no murmurs, rubs or gallops GI- soft, NT, ND, + BS Extremities- no clubbing or cyanosis. No edema  Telemetry    AV dual paced 80 (personally reviewed)  Hospital Course    Tanya Hogan is a 73 y.o. female with a history of HTN, HLD, DM, LBBB, NICM, OSA w/CPAP, CHB, chronic CHF (systolic), w/CRT-P, PVCs, Abandoned RV lead, and breast Ca (s/p mastectomy) who is being seen today for the evaluation of VT at the request of Dr. Gardenia.   Assessment & Plan    #Sustained VT #NSVT/PVCs History of the same. Previously felt to be a poor upgrade candidate given co-morbidities and abandoned lead.  Potentially exacerbated by ciprofloxacin  which can cause QT prolongation.  Continue amiodarone  400 mg BID for total of 5 days, then 200 mg BID.   Venogram yesterday concerning for occlusion; Unlikely to be able to get a 5th lead down.    Will discuss with Dr. Birmingham who is in town next week; if he is available to  consider extraction of her recent lead from 10/2021, or if need to transfer out to have done in a timely manner.    #CHB s/p Medtr[employer] CRT-P V Paced >98% of the time.  Previously had RV threshold increase and underwent left bundle area RV lead, with abandonment of prior lead from 2003.  Thresholds stable in unipolar in bipolar of existing leads. (<1.0 mv on left bundle)   #Diverticulitis Treated with ABx starting 9/17. Now has completed course  Ciprofloxacin  can prolong QT, so may have been an aggravating factor.    Disposition pending discussion with Dr. Birmingham.  Extraction next week vs transfer to tertiary center.   ADDENDUM Reviewed with Dr. Birmingham and EP team.  Will discuss possible transfer to [facility]. Will discuss with [facility] team, and if NOT felt to be candidate, would plan medical therapy alone.  Pt is appreciative of the update.   For questions or updates, please contact [facility] Please consult [email or web address] for contact info under     Signed, Ozell Prentice Passey, PA-C  05/19/2024, 7:43 AM    I have seen, examined the patient, and reviewed the above assessment and plan.    Interval:  No acute overnight events. Patient reports feeling relatively well. No new or acute complaints.  No ventricular arrhythmias on telemetry.  General: Well developed, in no acute distress.  Neck: No JVD.  Cardiac: Normal rate, regular rhythm.  Resp: Normal work of breathing.  Ext: No edema.  Neuro: No gross focal deficits.  Psych: Normal affect.   LHC/RHC 1.  Normal right dominant circulation with no obstructive coronary artery disease. 2.  Venography from the left axillary demonstrated that the subclavian vein proximal to the insertion site of multiple leads is patent however a wire and catheter could not cross the this confluence. 3.  Fick cardiac output of 5.7 L/min and Fick cardiac index of 3.1 L/min/m with following hemodynamics:            Right atrial pressure mean of 8  mmHg            Right ventricular pressure 52/1 with an end-diastolic pressure of 10 mmHg            Wedge pressure mean of 23 mmHg with V waves to 29 mmHg            PA pressure of 51/27 with mean of 37 mmHg            PVR of PVR of 2.5 Woods units            PA pulsatility index of 3 4.  LVEDP of 21 to 24 mmHg across the cardiac cycle  Assessment:  Tanya Hogan is a 72 y.o. female with a history of HTN, HLD, DM, LBBB, NICM, OSA w/CPAP, CHB, chronic CHF (systolic), w/CRT-P, PVCs, Abandoned RV lead, and breast Ca (s/p mastectomy) who is being seen today for the evaluation of VT at the request of Dr. Gardenia.   Patient had 4 hours of sustained monomorphic VT. Underwent LHC with no obstructive CAD. RHC with mildly elevated filling pressures and preserved CO/CI. LUE venogram was done with question of a small channel of patency; however, wire was unable to cross.   Problem List:  Sustained monomorphic ventricular tachycardia Complete heart block status post CRT-P Chronic systolic heart failure   Plan:  - Patient meets criteria for secondary prevention ICD given sustained monomorphic ventricular tachycardia and chronic systolic heart failure with LVEF less than 35% despite greater than 90 days guideline directed medical therapy.  She had ventricular tachycardia despite medical therapy with amiodarone .  Discussed role of ICD for secondary prevention and patient voiced that she DOES want ICD therapies.  The approach to providing her with a defibrillator, however, is a complicated one given that she already has a CRT-P and an abandoned RV pacing lead.  She was not felt to be an appropriate extraction candidate at [facility] by Dr. Waddell few years ago when malfunctioning RV lead was abandoned and left bundle branch area pacing lead was implanted.  He voices similar concerns today - unlikely to be surgical backup candidate here. Unfortunately, her vein is occluded so we cannot simply add an ICD lead  and abandon her LBBAP lead. She will need extraction for access in order to implant an ICD. We will explore options to transfer to another institution for lead extraction with surgical backup. This has been discussed with the patient who voiced understanding and agrees with the plan. -If we are unable to arrange transfer in a timely fashion then we can add mexiletine and discharge with a LifeVest with outpatient referral secured. -Continue oral amiodarone  for VT suppression.   [name], MD 05/19/2024 12:25 PM

## 2024-05-19 NOTE — Plan of Care (Signed)
  Problem: Education: Goal: Knowledge of General Education information will improve Description: Including pain rating scale, medication(s)/side effects and non-pharmacologic comfort measures Outcome: Progressing   Problem: Health Behavior/Discharge Planning: Goal: Ability to manage health-related needs will improve Outcome: Progressing   Problem: Clinical Measurements: Goal: Ability to maintain clinical measurements within normal limits will improve Outcome: Progressing Goal: Will remain free from infection Outcome: Progressing Goal: Diagnostic test results will improve Outcome: Progressing Goal: Respiratory complications will improve Outcome: Progressing Goal: Cardiovascular complication will be avoided Outcome: Progressing   Problem: Activity: Goal: Risk for activity intolerance will decrease Outcome: Progressing   Problem: Nutrition: Goal: Adequate nutrition will be maintained Outcome: Progressing   Problem: Coping: Goal: Level of anxiety will decrease Outcome: Progressing   Problem: Elimination: Goal: Will not experience complications related to bowel motility Outcome: Progressing Goal: Will not experience complications related to urinary retention Outcome: Progressing   Problem: Pain Managment: Goal: General experience of comfort will improve and/or be controlled Outcome: Progressing   Problem: Safety: Goal: Ability to remain free from injury will improve Outcome: Progressing   Problem: Skin Integrity: Goal: Risk for impaired skin integrity will decrease Outcome: Progressing   Problem: Education: Goal: Ability to describe self-care measures that may prevent or decrease complications (Diabetes Survival Skills Education) will improve Outcome: Progressing Goal: Individualized Educational Video(s) Outcome: Progressing   Problem: Coping: Goal: Ability to adjust to condition or change in health will improve Outcome: Progressing   Problem: Fluid  Volume: Goal: Ability to maintain a balanced intake and output will improve Outcome: Progressing   Problem: Health Behavior/Discharge Planning: Goal: Ability to identify and utilize available resources and services will improve Outcome: Progressing Goal: Ability to manage health-related needs will improve Outcome: Progressing   Problem: Metabolic: Goal: Ability to maintain appropriate glucose levels will improve Outcome: Progressing   Problem: Nutritional: Goal: Maintenance of adequate nutrition will improve Outcome: Progressing Goal: Progress toward achieving an optimal weight will improve Outcome: Progressing   Problem: Skin Integrity: Goal: Risk for impaired skin integrity will decrease Outcome: Progressing   Problem: Tissue Perfusion: Goal: Adequacy of tissue perfusion will improve Outcome: Progressing   Problem: Education: Goal: Knowledge of cardiac device and self-care will improve Outcome: Progressing Goal: Ability to safely manage health related needs after discharge will improve Outcome: Progressing Goal: Individualized Educational Video(s) Outcome: Progressing   Problem: Cardiac: Goal: Ability to achieve and maintain adequate cardiopulmonary perfusion will improve Outcome: Progressing   Problem: Education: Goal: Understanding of CV disease, CV risk reduction, and recovery process will improve Outcome: Progressing Goal: Individualized Educational Video(s) Outcome: Progressing   Problem: Activity: Goal: Ability to return to baseline activity level will improve Outcome: Progressing   Problem: Cardiovascular: Goal: Ability to achieve and maintain adequate cardiovascular perfusion will improve Outcome: Progressing Goal: Vascular access site(s) Level 0-1 will be maintained Outcome: Progressing   Problem: Health Behavior/Discharge Planning: Goal: Ability to safely manage health-related needs after discharge will improve Outcome: Progressing

## 2024-05-19 NOTE — Progress Notes (Signed)
 I evaluated Ms. Shelton this evening with physical therapy.  She was able to ambulate with [place] assistance down the hall 1 time and on the way back had to rest in chair as she was extremely fatigued.  She was able to stand against the wall for several minutes but was unable to immediately return to her room. She was semi stable on her feet but her exertional capacity is extremely limited.  I do not think she would be a sternotomy candidate even at a tertiary care center.  We did discuss several options for management of her VT including extraction of her existing RV/LBaP lead with retained access for Rvd lead placement without surgical backup, transfer for lead management to another center that may offer surgical backup versus attempting addition of a defib lead and upgrade from LOT/CRTP to LOT/CRTD with abandonment of her Rvp lead and RV/LBaP lead.  The obvious downsides are abandoning the lead through her tricuspid valve and adding yet another lead.  She already has mild to moderate TR.  She was able to tolerate the VT both in 06/2023 and during this admission without hemodynamic collapse.  Unfortunately she continues to have VT despite maintenance amiodarone .  Mexiletine has been added this hospital admission.  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  I think we would be able to pass a wire superior to the existing leads and venoplasty if necessary to add an additional lead.  I have reached out to Dr. Todd Ceo at [facility] who would be performing extractions next Friday.  I will discuss further with him tomorrow when he is back in town.  Also will reach out to Dr. Waddell for his input since is his primary patient. I would preference removal of the existing RV/LBaP lead (DOI 11/03/21) for retained axis and replacement with a new Rvd lead so she is left with only one capped/abandoned lead through the TV. I do not think removal of her already  abandoned Rvp (DOI 2003) lead is worth the risk.   [name], MD [facility]  Cardiac Electrophysiology

## 2024-05-19 NOTE — Progress Notes (Signed)
 Physical Therapy Treatment Patient Details Name: Tanya Hogan MRN: [id number] DOB: 1951-04-02 Today's Date: 05/19/2024   History of Present Illness Tanya Hogan is a 73 y.o. female admitted with emesis and Vtach s/p cardioversion  PMH: nonischemic cardiomyopathy with EF 20%, VT on amiodarone , h/o complete heart block now s/p BiV pacemaker, HTN, HLD, diabetes    PT Comments  Pt making steady progress toward goals, limited mostly by fatigue.  Spent> 20 min talking with MD on plans or options as the next steps.  Pt otherwise continued to walk and talk as well as negotiate the hall with the RW and CGA.     If plan is discharge home, recommend the following: Assist for transportation   Can travel by private vehicle        Equipment Recommendations  None recommended by PT    Recommendations for Other Services       Precautions / Restrictions Precautions Precautions: Fall Recall of Precautions/Restrictions: Intact Restrictions Weight Bearing Restrictions Per Provider Order: No     Mobility  Bed Mobility Overal bed mobility: Needs Assistance Bed Mobility: Supine to Sit     Supine to sit: Supervision     General bed mobility comments: pt sitting in the recliner, wanting to go to the bathroom    Transfers Overall transfer level: Needs assistance Equipment used: Rolling Hulce (2 wheels) Transfers: Sit to/from Stand Sit to Stand: Min assist (min on low surfaces)           General transfer comment: cues for hand placement    Ambulation/Gait Ambulation/Gait assistance: Contact guard assist Gait Distance (Feet): 300 Feet (then 100 feet with the RW.   many standing periods talking with MD over 20 min) Assistive device: Rolling Vohs (2 wheels) Gait Pattern/deviations: Step-through pattern, Decreased stride length, Drifts right/left Gait velocity: dec Gait velocity interpretation: 1.31 - 2.62 ft/sec, indicative of limited community ambulator   General Gait  Details: progressive increased use of UE's with onset of fatigue, mild DOE otherwise steady and safe use of the RW.  Finally, pt was encouraged tosit after >20 min up.   Stairs             Wheelchair Mobility     Tilt Bed    Modified Rankin (Stroke Patients Only)       Balance Overall balance assessment: Mild deficits observed, not formally tested                                          Communication Communication Communication: No apparent difficulties  Cognition Arousal: Alert Behavior During Therapy: WFL for tasks assessed/performed   PT - Cognitive impairments: No apparent impairments                       PT - Cognition Comments: very sharp cognitively Following commands: Intact      Cueing Cueing Techniques: Verbal cues  Exercises      General Comments General comments (skin integrity, edema, etc.): vss, rhythm becomes uniform with gait, and more irregular at rest.      Pertinent Vitals/Pain      Home Living Family/patient expects to be discharged to:: Private residence Living Arrangements: Other relatives (Brother) Available Help at Discharge: Family;Available PRN/intermittently Type of Home: House Home Access: Ramped entrance       Home Layout: One level Home Equipment: Agricultural consultant (2 wheels);Shower  seat Additional Comments: 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    Prior Function            PT Goals (current goals can now be found in the care plan section) Acute Rehab PT Goals Patient Stated Goal: didn't state PT Goal Formulation: With patient Time For Goal Achievement: 05/31/24 Potential to Achieve Goals: Good Progress towards PT goals: Progressing toward goals    Frequency    Min 2X/week      PT Plan      Co-evaluation              AM-PAC PT 6 Clicks Mobility   Outcome Measure  Help needed turning from your back to your side while in a flat bed without using bedrails?: None Help needed  moving from lying on your back to sitting on the side of a flat bed without using bedrails?: None Help needed moving to and from a bed to a chair (including a wheelchair)?: None Help needed standing up from a chair using your arms (e.g., wheelchair or bedside chair)?: None Help needed to walk in hospital room?: A Little Help needed climbing 3-5 steps with a railing? : A Little 6 Click Score: 22    End of Session Equipment Utilized During Treatment: Gait belt Activity Tolerance: Patient tolerated treatment well Patient left: in chair;with call bell/phone within reach;with chair alarm set;with nursing/sitter in room Nurse Communication: Mobility status (urinary incontinence) PT Visit Diagnosis: Difficulty in walking, not elsewhere classified (R26.2)     Time: 8285-8245 PT Time Calculation (min) (ACUTE ONLY): 40 min  Charges:    $Gait Training: 8-22 mins $Therapeutic Activity: 23-37 mins PT General Charges $$ ACUTE PT VISIT: 1 Visit                     05/19/2024  Tanya Hogan., PT Acute Rehabilitation Services [phone]  (office)   Tanya Hogan 05/19/2024, 6:02 PM

## 2024-05-20 DIAGNOSIS — I472 Ventricular tachycardia, unspecified: Secondary | ICD-10-CM | POA: Diagnosis not present

## 2024-05-20 LAB — BASIC METABOLIC PANEL WITH GFR
Anion gap: 9 (ref 5–15)
BUN: 16 mg/dL (ref 8–23)
CO2: 24 mmol/L (ref 22–32)
Calcium: 9.9 mg/dL (ref 8.9–10.3)
Chloride: 104 mmol/L (ref 98–111)
Creatinine, Ser: 0.78 mg/dL (ref 0.44–1.00)
GFR, Estimated: >60 mL/min (ref >60)
Glucose, Bld: 105 mg/dL — ABNORMAL HIGH (ref 70–99)
Potassium: 5 mmol/L (ref 3.5–5.1)
Sodium: 137 mmol/L (ref 135–145)

## 2024-05-20 LAB — GLUCOSE, CAPILLARY
Glucose-Capillary: 102 mg/dL — ABNORMAL HIGH (ref 70–99)
Glucose-Capillary: 120 mg/dL — ABNORMAL HIGH (ref 70–99)
Glucose-Capillary: 109 mg/dL — ABNORMAL HIGH (ref 70–99)
Glucose-Capillary: 100 mg/dL — ABNORMAL HIGH (ref 70–99)

## 2024-05-20 NOTE — Plan of Care (Signed)

## 2024-05-20 NOTE — Progress Notes (Signed)
 Patient ambulated an estimate 160ft. In the hallway with RN utilizing front wheel Hodak. Tolerated well.   After walking, patient returned to bed with intentions to nap. RN placed patient on CPAP, per patient request.   Patient woke an estimated 30 minutes later, c/o SOB. Breath sounds clear/diminished, and unchanged from previous assessment. Patient oxygen  saturation 93% on room air. Patient continued to endorse SOB. RN administered PRN breathing treatment. RN notified RT, and asked if RT could come assess CPAP machine to verify correct settings.   15 minutes following interventions, patient endorses feeling better, and transferred from bed to chair. Oxygen  saturation 94% on room air. Patient denies SOB.

## 2024-05-20 NOTE — Progress Notes (Signed)
  Patient Name: Tanya Hogan Date of Encounter: 05/20/2024  Primary Cardiologist: Redell Shallow, MD Electrophysiologist: Danelle Birmingham, MD  Interval Summary   Cath with no significant CAD.  Venogram concerning for occluded SVC, could not pass a small wire through.   Feeling OK this am. No new concerns.   Vital Signs    Vitals:   05/19/24 2238 05/20/24 0100 05/20/24 0507 05/20/24 0939  BP:  (!) 110/57 107/62 (!) 97/56  Pulse: 80 78 80 82  Resp: 15 16 20 18   Temp:  98.3 F (36.8 C) 98.5 F (36.9 C) 98.2 F (36.8 C)  TempSrc:  Oral Oral Oral  SpO2:  94% 94% 95%  Weight:   85.1 kg   Height:        Intake/Output Summary (Last 24 hours) at 05/20/2024 1045 Last data filed at 05/20/2024 0644 Gross per 24 hour  Intake 417 ml  Output 300 ml  Net 117 ml   Filed Weights   05/18/24 0300 05/19/24 0350 05/20/24 0507  Weight: 88 kg 85.4 kg 85.1 kg    Physical Exam    GEN- NAD, Alert and oriented  Lungs- Clear to ausculation bilaterally, normal work of breathing Cardiac- Regular rate and rhythm, no murmurs, rubs or gallops GI- soft, NT, ND, + BS Extremities- no clubbing or cyanosis. No edema  Telemetry    AV dual paced 80 (personally reviewed)  Hospital Course    Tanya Hogan is a 73 y.o. female with a history of HTN, HLD, DM, LBBB, NICM, OSA w/CPAP, CHB, chronic CHF (systolic), w/CRT-P, PVCs, Abandoned RV lead, and breast Ca (s/p mastectomy) who is being seen today for the evaluation of VT at the request of Dr. Gardenia.   Assessment & Plan    #Sustained VT #NSVT/PVCs History of the same. Previously felt to be a poor upgrade candidate given co-morbidities and abandoned lead.  Potentially exacerbated by ciprofloxacin  which can cause QT prolongation.  Continue amiodarone  400 mg BID for total of 5 days, then 200 mg BID.   Venogram concerning for occlusion; Unlikely to be able to get a 5th lead down.    See note by Dr. Almetta yesterday outlining plan. Possible  extraction and implantation of an ICD lead this week either here or possible transfer to [facility].     #CHB s/p Medtronic CRT-P V Paced >98% of the time.  Previously had RV threshold increase and underwent left bundle area RV lead, with abandonment of prior lead from 2003.  Thresholds stable in unipolar in bipolar of existing leads. (<1.0 mv on left bundle)   #Diverticulitis Treated with ABx starting 9/17. Now has completed course  Ciprofloxacin  can prolong QT, so may have been an aggravating factor.    Disposition pending decision of the lead extracting physicians.  [place].  Extraction next week vs transfer to tertiary center.     For questions or updates, please contact [facility] Please consult [email or web address] for contact info under     Signed, Eulas FORBES Furbish, MD  05/20/2024, 10:45 AM      Eulas FORBES Furbish, MD 05/20/2024 10:45 AM

## 2024-05-20 NOTE — Progress Notes (Signed)
 I reviewed the previous chest x-rays and her LV lead position looks to be in the AIV.  I spoke with Dr. Waddell who did report that she had some improvement in symptoms following placement of the RV/LBaP lead.  Her LVEF has been severely reduced since at least the beginning of 2023.  LVEF was 35-40% on 02/05/2020.  The last that her LVEF was normal was in 2011 from what I can see in our system.  Dr. Waddell would favor increased amiodarone  and continuation of mexiletine without upgrade to defibrillator.  Additionally I spoke with Dr. Pierrette at [facility] and reviewed her case with him as he is on this upcoming week for extractions.  He agreed that based off her longstanding cardiomyopathy and current comorbidities with associated significant frailty that he would try and manage her without device upgrade if that was consistent with her goals of care.  Up to this point her VT has been hemodynamically tolerated so exposing her to the risk of going back to her previous pocket may just result in additional shocks at home.  The benefit of upgrading her device would be primarily for ATP so I do not think addition of something like an SICD would be that helpful in her situation.  If no recurrent VT/VF plan to continue amiodarone  400 mg twice daily load followed by 400 mg daily with mexiletine 150 mg twice daily.  [name], MD [facility]  Cardiac Electrophysiology

## 2024-05-20 NOTE — Progress Notes (Signed)
 Mobility Specialist Progress Note:    05/20/24 1428  Mobility  Activity Ambulated with assistance  Level of Assistance Contact guard assist, steadying assist  Assistive Device Front wheel [place]  Distance Ambulated (ft) 250 ft  Activity Response Tolerated well  Mobility Referral Yes  Mobility visit 1 Mobility  Mobility Specialist Start Time (ACUTE ONLY) 1428  Mobility Specialist Stop Time (ACUTE ONLY) 1442  Mobility Specialist Time Calculation (min) (ACUTE ONLY) 14 min   Pt pleasant and agreeable to session. No c/o any symptoms. Pt took two standing breaks but otherwise doing well. Returned pt to room w/ all needs met.   Venetia Keel Mobility Specialist Please Neurosurgeon or Rehab Office at [phone]

## 2024-05-20 NOTE — Progress Notes (Signed)
   05/20/24 2300  BiPAP/CPAP/SIPAP  BiPAP/CPAP/SIPAP Pt Type Adult  BiPAP/CPAP/SIPAP DREAMSTATIOND  Mask Type Full face mask  Mask Size Medium  Set Rate 0 breaths/min  Respiratory Rate 16 breaths/min  EPAP 5 cmH2O  FiO2 (%) 21 %  Patient Home Machine No  Patient Home Mask No  Patient Home Tubing No  Auto Titrate No  CPAP/SIPAP surface wiped down Yes  Device Plugged into RED Power Outlet Yes  Oxygen  Percent 21 %  BiPAP/CPAP /SiPAP Vitals  Bilateral Breath Sounds Clear;Diminished

## 2024-05-21 DIAGNOSIS — I472 Ventricular tachycardia, unspecified: Secondary | ICD-10-CM | POA: Diagnosis not present

## 2024-05-21 LAB — BASIC METABOLIC PANEL WITH GFR
Anion gap: 8 (ref 5–15)
BUN: 23 mg/dL (ref 8–23)
CO2: 25 mmol/L (ref 22–32)
Calcium: 9.9 mg/dL (ref 8.9–10.3)
Chloride: 104 mmol/L (ref 98–111)
Creatinine, Ser: 0.87 mg/dL (ref 0.44–1.00)
GFR, Estimated: >60 mL/min (ref >60)
Glucose, Bld: 116 mg/dL — ABNORMAL HIGH (ref 70–99)
Potassium: 5.1 mmol/L (ref 3.5–5.1)
Sodium: 137 mmol/L (ref 135–145)

## 2024-05-21 LAB — GLUCOSE, CAPILLARY
Glucose-Capillary: 154 mg/dL — ABNORMAL HIGH (ref 70–99)
Glucose-Capillary: 98 mg/dL (ref 70–99)
Glucose-Capillary: 98 mg/dL (ref 70–99)
Glucose-Capillary: 138 mg/dL — ABNORMAL HIGH (ref 70–99)

## 2024-05-21 LAB — LIPOPROTEIN A (LPA): Lipoprotein (a): 96.1 nmol/L — ABNORMAL HIGH (ref <75.0)

## 2024-05-21 NOTE — Progress Notes (Signed)
 Patient reporting dizziness, nausea, and indigestion. Patient confirms she ate all her  breakfast. Zofran  given.   05/21/24 0812  Vitals  BP 90/61  MAP (mmHg) 69  ECG Heart Rate 78  MEWS COLOR  MEWS Score Color Green  MEWS Score  MEWS Temp 0  MEWS Systolic 1  MEWS Pulse 0  MEWS RR 0  MEWS LOC 0  MEWS Score 1

## 2024-05-21 NOTE — Progress Notes (Signed)
  Patient Name: Tanya Hogan Date of Encounter: 05/21/2024  Primary Cardiologist: Redell Shallow, MD Electrophysiologist: Danelle Birmingham, MD  Interval Summary   Cath with no significant CAD.  Venogram concerning for occluded SVC, could not pass a small wire through.   Feeling OK this am. No new concerns.   Vital Signs    Vitals:   05/21/24 0745 05/21/24 0751 05/21/24 0812 05/21/24 1049  BP:  (!) 94/54 90/61 115/71  Pulse:  78  84  Resp: 18 18  18   Temp: 97.8 F (36.6 C)   97.6 F (36.4 C)  TempSrc: Oral   Oral  SpO2:  97%  98%  Weight:      Height:        Intake/Output Summary (Last 24 hours) at 05/21/2024 1057 Last data filed at 05/21/2024 0603 Gross per 24 hour  Intake 720 ml  Output --  Net 720 ml   Filed Weights   05/19/24 0350 05/20/24 0507 05/21/24 0338  Weight: 85.4 kg 85.1 kg 85.5 kg    Physical Exam    GEN- NAD, Alert and oriented  Lungs- Clear to ausculation bilaterally, normal work of breathing Cardiac- Regular rate and rhythm, no murmurs, rubs or gallops GI- soft, NT, ND, + BS Extremities- no clubbing or cyanosis. No edema  Telemetry    AV dual paced 80 (personally reviewed)  Hospital Course    Tanya Hogan is a 72 y.o. female with a history of HTN, HLD, DM, LBBB, NICM, OSA w/CPAP, CHB, chronic CHF (systolic), w/CRT-P, PVCs, Abandoned RV lead, and breast Ca (s/p mastectomy) who is being seen today for the evaluation of VT at the request of Dr. Gardenia.   Assessment & Plan    #Sustained VT #NSVT/PVCs History of the same. Previously felt to be a poor upgrade candidate given co-morbidities and abandoned lead.  Potentially exacerbated by ciprofloxacin  which can cause QT prolongation.  Continue amiodarone  400 mg BID for total of 5 days, then 200 mg BID.   Venogram concerning for occlusion.   See note by Dr. Almetta yesterday outlining plan. After much discussion, the decision is to defer extraction of the left bundle branch pacing lead  lead, which we are concerned will decrease CRT and LV synchrony leading to worse outcomes with a poorly functioning LV lead. Continue amiodarone .    #CHB s/p Medtronic CRT-P V Paced >98% of the time.  Previously had RV threshold increase and underwent left bundle area RV lead, with abandonment of prior lead from 2003.  Thresholds stable in unipolar in bipolar of existing leads. (<1.0 mv on left bundle)   #Diverticulitis Treated with ABx starting 9/17. Now has completed course  Ciprofloxacin  can prolong QT, so may have been an aggravating factor.    Disposition: Will have PT/OT eval.    For questions or updates, please contact [facility] Please consult [email or web address] for contact info under     Signed, Eulas FORBES Furbish, MD  05/21/2024, 10:57 AM      Eulas FORBES Furbish, MD 05/21/2024 10:57 AM

## 2024-05-21 NOTE — Progress Notes (Signed)
 Mobility Specialist Progress Note:    05/21/24 1444  Mobility  Activity Ambulated with assistance  Level of Assistance Standby assist, set-up cues, supervision of patient - no hands on  Assistive Device Front wheel [place]  Distance Ambulated (ft) 250 ft  Activity Response Tolerated well  Mobility Referral Yes  Mobility visit 1 Mobility  Mobility Specialist Start Time (ACUTE ONLY) 1444  Mobility Specialist Stop Time (ACUTE ONLY) 1506  Mobility Specialist Time Calculation (min) (ACUTE ONLY) 22 min   Pt pleasant sitting in recliner agreeable to session. C/o of light dizziness but otherwise doing well. Pt needing one seated break before finishing session. Returned pt to recliner w/ all needs met.   Venetia Keel Mobility Specialist Please Neurosurgeon or Rehab Office at [phone]

## 2024-05-21 NOTE — Progress Notes (Signed)
   05/21/24 2233  BiPAP/CPAP/SIPAP  BiPAP/CPAP/SIPAP Pt Type Adult  BiPAP/CPAP/SIPAP DREAMSTATIOND  Mask Type Full face mask  Mask Size Medium  Respiratory Rate 16 breaths/min  IPAP 10 cmH20  EPAP 5 cmH2O  FiO2 (%) 21 %  Flow Rate 0 lpm  Patient Home Machine No  Patient Home Mask No  Patient Home Tubing No  Auto Titrate No  Device Plugged into RED Power Outlet Yes  BiPAP/CPAP /SiPAP Vitals  Resp 16  SpO2 96 %

## 2024-05-21 NOTE — Plan of Care (Signed)

## 2024-05-22 DIAGNOSIS — I472 Ventricular tachycardia, unspecified: Secondary | ICD-10-CM | POA: Diagnosis not present

## 2024-05-22 LAB — BASIC METABOLIC PANEL WITH GFR
Anion gap: 6 (ref 5–15)
BUN: 20 mg/dL (ref 8–23)
CO2: 24 mmol/L (ref 22–32)
Calcium: 10.1 mg/dL (ref 8.9–10.3)
Chloride: 105 mmol/L (ref 98–111)
Creatinine, Ser: 0.86 mg/dL (ref 0.44–1.00)
GFR, Estimated: >60 mL/min (ref >60)
Glucose, Bld: 104 mg/dL — ABNORMAL HIGH (ref 70–99)
Potassium: 5.1 mmol/L (ref 3.5–5.1)
Sodium: 135 mmol/L (ref 135–145)

## 2024-05-22 LAB — GLUCOSE, CAPILLARY
Glucose-Capillary: 155 mg/dL — ABNORMAL HIGH (ref 70–99)
Glucose-Capillary: 92 mg/dL (ref 70–99)
Glucose-Capillary: 150 mg/dL — ABNORMAL HIGH (ref 70–99)
Glucose-Capillary: 133 mg/dL — ABNORMAL HIGH (ref 70–99)

## 2024-05-22 MED ORDER — HYDROCORTISONE 1 % EX CREA
TOPICAL_CREAM | Freq: Four times a day (QID) | CUTANEOUS | Status: DC | PRN
  Filled 2024-05-22: qty 28

## 2024-05-22 NOTE — Plan of Care (Signed)
  Problem: Education: Goal: Knowledge of General Education information will improve Description: Including pain rating scale, medication(s)/side effects and non-pharmacologic comfort measures Outcome: Progressing   Problem: Health Behavior/Discharge Planning: Goal: Ability to manage health-related needs will improve Outcome: Progressing   Problem: Clinical Measurements: Goal: Ability to maintain clinical measurements within normal limits will improve Outcome: Progressing Goal: Will remain free from infection Outcome: Progressing Goal: Diagnostic test results will improve Outcome: Progressing Goal: Respiratory complications will improve Outcome: Progressing Goal: Cardiovascular complication will be avoided Outcome: Progressing   Problem: Activity: Goal: Risk for activity intolerance will decrease Outcome: Progressing   Problem: Nutrition: Goal: Adequate nutrition will be maintained Outcome: Progressing   Problem: Coping: Goal: Level of anxiety will decrease Outcome: Progressing   Problem: Elimination: Goal: Will not experience complications related to bowel motility Outcome: Progressing Goal: Will not experience complications related to urinary retention Outcome: Progressing   Problem: Pain Managment: Goal: General experience of comfort will improve and/or be controlled Outcome: Progressing   Problem: Safety: Goal: Ability to remain free from injury will improve Outcome: Progressing   Problem: Skin Integrity: Goal: Risk for impaired skin integrity will decrease Outcome: Progressing   Problem: Education: Goal: Ability to describe self-care measures that may prevent or decrease complications (Diabetes Survival Skills Education) will improve Outcome: Progressing Goal: Individualized Educational Video(s) Outcome: Progressing   Problem: Coping: Goal: Ability to adjust to condition or change in health will improve Outcome: Progressing   Problem: Fluid  Volume: Goal: Ability to maintain a balanced intake and output will improve Outcome: Progressing   Problem: Health Behavior/Discharge Planning: Goal: Ability to identify and utilize available resources and services will improve Outcome: Progressing Goal: Ability to manage health-related needs will improve Outcome: Progressing   Problem: Metabolic: Goal: Ability to maintain appropriate glucose levels will improve Outcome: Progressing   Problem: Nutritional: Goal: Maintenance of adequate nutrition will improve Outcome: Progressing Goal: Progress toward achieving an optimal weight will improve Outcome: Progressing   Problem: Skin Integrity: Goal: Risk for impaired skin integrity will decrease Outcome: Progressing   Problem: Tissue Perfusion: Goal: Adequacy of tissue perfusion will improve Outcome: Progressing   Problem: Education: Goal: Knowledge of cardiac device and self-care will improve Outcome: Progressing Goal: Ability to safely manage health related needs after discharge will improve Outcome: Progressing Goal: Individualized Educational Video(s) Outcome: Progressing   Problem: Cardiac: Goal: Ability to achieve and maintain adequate cardiopulmonary perfusion will improve Outcome: Progressing   Problem: Education: Goal: Understanding of CV disease, CV risk reduction, and recovery process will improve Outcome: Progressing Goal: Individualized Educational Video(s) Outcome: Progressing   Problem: Activity: Goal: Ability to return to baseline activity level will improve Outcome: Progressing   Problem: Cardiovascular: Goal: Ability to achieve and maintain adequate cardiovascular perfusion will improve Outcome: Progressing Goal: Vascular access site(s) Level 0-1 will be maintained Outcome: Progressing   Problem: Health Behavior/Discharge Planning: Goal: Ability to safely manage health-related needs after discharge will improve Outcome: Progressing

## 2024-05-22 NOTE — Progress Notes (Signed)
 Mobility Specialist Progress Note;   05/22/24 1416  Mobility  Activity Ambulated with assistance  Level of Assistance Standby assist, set-up cues, supervision of patient - no hands on  Assistive Device Front wheel [place]  Distance Ambulated (ft) 250 ft  Activity Response Tolerated well  Mobility Referral Yes  Mobility visit 1 Mobility  Mobility Specialist Start Time (ACUTE ONLY) 1416  Mobility Specialist Stop Time (ACUTE ONLY) 1425  Mobility Specialist Time Calculation (min) (ACUTE ONLY) 9 min   Pt agreeable to mobility. Required no physical assistance during ambulation, SV for safety. Took 2x standing rest breaks d/t fatigue, otherwise no c/o. Requested to sit in chair at [facility]. Pt left in chair with all needs met, alarm on.   Lauraine Erm Mobility Specialist Please contact via SecureChat or [employer] [phone]

## 2024-05-22 NOTE — Progress Notes (Signed)
 Patient Name: Tanya Hogan Date of Encounter: 05/21/2024  Primary Cardiologist: Tanya Shallow, MD Electrophysiologist: Tanya Birmingham, MD  Interval Summary   No overnight issues.  Continues to AP/VP. No recurrent arrhythmias over the weekend.  I had a long discussion with her today about different options for management and recommended that we continue conservative therapy of increased amiodarone  with addition of mexiletine and monitor for arrhythmia recurrence.  If we have to upgrade her device to a defibrillator down the road then I would like to consider extraction of her chronically implanted RV/LBaP lead and reimplant a RV-defib/LBaP lead which we should have in stock within the next few weeks.  Regardless for now not planning on any intervention.  She was tearful we discussed driving.  I explained that regardless of whether or not we upgraded her device to defibrillator she should not be driving because of the high risk of recurrent ventricular arrhythmias and the risks that poses to herself and others.   Vital Signs    Vitals:   05/21/24 0745 05/21/24 0751 05/21/24 0812 05/21/24 1049  BP:  (!) 94/54 90/61 115/71  Pulse:  78  84  Resp: 18 18  18   Temp: 97.8 F (36.6 C)   97.6 F (36.4 C)  TempSrc: Oral   Oral  SpO2:  97%  98%  Weight:      Height:       Intake/Output Summary (Last 24 hours) at 05/21/2024 1057 Last data filed at 05/21/2024 0603 Gross per 24 hour  Intake 720 ml  Output --  Net 720 ml   Filed Weights   05/19/24 0350 05/20/24 0507 05/21/24 0338  Weight: 85.4 kg 85.1 kg 85.5 kg   Physical Exam    GEN- NAD, Alert and oriented  Lungs- Clear to ausculation bilaterally, normal work of breathing Cardiac- Regular rate and rhythm, no murmurs, rubs or gallops GI- soft, NT, ND, + BS Extremities- no clubbing or cyanosis. No edema  Telemetry    AP-VP 80s  Hospital Course    Tanya Hogan is a 73 y.o. female with a history of HTN, HLD, DM, LBBB, NICM, OSA  w/CPAP, CHB, chronic CHF (systolic), w/CRT-P, PVCs, Abandoned RV lead, and breast Ca (s/p mastectomy) who is being seen today for the evaluation of VT at the request of Dr. Gardenia.   Assessment & Plan    Sustained VT NSVT/PVCs History of the same. Previously felt to be a poor upgrade candidate given co-morbidities and abandoned lead.  Potentially exacerbated by ciprofloxacin  which can cause QT prolongation.  Continue amiodarone  400 mg BID for total of 5 days, then 200 mg BID.  Venogram with partial occlusion. Defer upgrade to defib with comorbidities and risk of losing CRT from LBaP lead. Also with HD stable VT up to this point and risk for cardiac perforation without surgical backup if we need to extract prior LBaP lead for reimplant.   3.  CHB s/p Medtronic CRT-P V Paced >98% of the time.  Previously had RV threshold increase and underwent left bundle area RV lead, with abandonment of prior lead from 2003.  Thresholds stable in unipolar in bipolar of existing leads. (<1.0 mv on left bundle)   4.  Diverticulitis Treated with ABx starting 9/17. Now has completed course  Ciprofloxacin  can prolong QT, so may have been an aggravating factor.    Disposition: Will have PT/OT eval.  For questions or updates, please contact Tanya Hogan Please consult [email or web address] for contact info under  Tanya Tanya Primus, MD Tanya Hogan  Cardiac Electrophysiology

## 2024-05-22 NOTE — Progress Notes (Signed)
 Occupational Therapy Treatment Patient Details Name: Tanya Hogan MRN: [id number] DOB: [birth year] Today's Date: 05/22/2024   History of present illness Tanya Hogan is a 73 y.o. female admitted with emesis and Vtach s/p cardioversion  PMH: nonischemic cardiomyopathy with EF 20%, VT on amiodarone , h/o complete heart block now s/p BiV pacemaker, HTN, HLD, diabetes   OT comments  Pt progressing toward goals, able to perform bathing task seated at sink, and then ambulation in hall. Pt needing x3 standing rest breaks in hall with cues for activity pacing. Pt needing assist for LB ADL (donning socks), reports difficulty with this at home. Has sock aid at home but says she 'needs practice with it, plan sock aid practice for future sessions. Pt presenting with impairments listed below, will follow acutely. Recommend HHOT at d/c.       If plan is discharge home, recommend the following:  A little help with walking and/or transfers;A little help with bathing/dressing/bathroom;Assistance with cooking/housework;Assist for transportation;Help with stairs or ramp for entrance   Equipment Recommendations  None recommended by OT    Recommendations for Other Services PT consult    Precautions / Restrictions Precautions Precautions: Fall Recall of Precautions/Restrictions: Intact Restrictions Weight Bearing Restrictions Per Provider Order: No       Mobility Bed Mobility               General bed mobility comments: OOB in chair at sink upon arrival    Transfers Overall transfer level: Needs assistance Equipment used: Rolling [place] (2 wheels) Transfers: Sit to/from Stand Sit to Stand: Min assist                 Balance Overall balance assessment: Mild deficits observed, not formally tested                                         ADL either performed or assessed with clinical judgement   ADL Overall ADL's : Needs assistance/impaired         Upper  Body Bathing: Minimal assistance;Sitting   Lower Body Bathing: Minimal assistance;Sitting/lateral leans   Upper Body Dressing : Minimal assistance;Sitting   Lower Body Dressing: Moderate assistance;Sitting/lateral leans Lower Body Dressing Details (indicate cue type and reason): assist to don socks Toilet Transfer: Contact guard assist;Rolling [place] (2 wheels)           Functional mobility during ADLs: Contact guard assist;Rolling [place] (2 wheels)      Extremity/Trunk Assessment Upper Extremity Assessment Upper Extremity Assessment: Generalized weakness   Lower Extremity Assessment Lower Extremity Assessment: Defer to PT evaluation        Vision   Vision Assessment?: No apparent visual deficits   Perception Perception Perception: Not tested   Praxis Praxis Praxis: Not tested   Communication Communication Communication: No apparent difficulties   Cognition Arousal: Alert Behavior During Therapy: WFL for tasks assessed/performed Cognition: No apparent impairments                               Following commands: Intact        Cueing   Cueing Techniques: Verbal cues  Exercises      Shoulder Instructions       General Comments VSS,    Pertinent Vitals/ Pain       Pain Assessment Pain Assessment: No/denies pain  Home Living  Prior Functioning/Environment              Frequency  Min 2X/week        Progress Toward Goals  OT Goals(current goals can now be found in the care plan section)  Progress towards OT goals: Progressing toward goals  Acute Rehab OT Goals OT Goal Formulation: With patient Time For Goal Achievement: 05/31/24 Potential to Achieve Goals: Good ADL Goals Pt Will Perform Lower Body Bathing: with modified independence Pt Will Perform Lower Body Dressing: with modified independence Pt Will Transfer to Toilet: with modified independence  Plan       Co-evaluation                 AM-PAC OT 6 Clicks Daily Activity     Outcome Measure   Help from another person eating meals?: None Help from another person taking care of personal grooming?: A Little Help from another person toileting, which includes using toliet, bedpan, or urinal?: A Little Help from another person bathing (including washing, rinsing, drying)?: A Little Help from another person to put on and taking off regular upper body clothing?: A Little Help from another person to put on and taking off regular lower body clothing?: A Little 6 Click Score: 19    End of Session Equipment Utilized During Treatment: Gait belt;Rolling [place] (2 wheels)  OT Visit Diagnosis: Unsteadiness on feet (R26.81);Muscle weakness (generalized) (M62.81)   Activity Tolerance Patient tolerated treatment well   Patient Left in chair;with call bell/phone within reach;with chair alarm set   Nurse Communication Mobility status        Time: 9058-8989 OT Time Calculation (min): 29 min  Charges: OT General Charges $OT Visit: 1 Visit OT Treatments $Self Care/Home Management : 8-22 mins $Therapeutic Activity: 8-22 mins  Tanya Hogan, OTD, OTR/L SecureChat Preferred Acute Rehab [phone]   Tanya Hogan 05/22/2024, 10:44 AM

## 2024-05-23 DIAGNOSIS — I472 Ventricular tachycardia, unspecified: Secondary | ICD-10-CM | POA: Diagnosis not present

## 2024-05-23 LAB — GLUCOSE, CAPILLARY
Glucose-Capillary: 113 mg/dL — ABNORMAL HIGH (ref 70–99)
Glucose-Capillary: 94 mg/dL (ref 70–99)
Glucose-Capillary: 98 mg/dL (ref 70–99)
Glucose-Capillary: 100 mg/dL — ABNORMAL HIGH (ref 70–99)

## 2024-05-23 LAB — BASIC METABOLIC PANEL WITH GFR
Anion gap: 8 (ref 5–15)
BUN: 18 mg/dL (ref 8–23)
CO2: 24 mmol/L (ref 22–32)
Calcium: 10 mg/dL (ref 8.9–10.3)
Chloride: 105 mmol/L (ref 98–111)
Creatinine, Ser: 0.93 mg/dL (ref 0.44–1.00)
GFR, Estimated: >60 mL/min (ref >60)
Glucose, Bld: 100 mg/dL — ABNORMAL HIGH (ref 70–99)
Potassium: 5.5 mmol/L — ABNORMAL HIGH (ref 3.5–5.1)
Sodium: 137 mmol/L (ref 135–145)

## 2024-05-23 MED ORDER — METOPROLOL SUCCINATE ER 50 MG PO TB24
50.0000 mg | ORAL_TABLET | Freq: Two times a day (BID) | ORAL | Status: DC
  Administered 2024-05-23 – 2024-05-24 (×2): 50 mg via ORAL
  Filled 2024-05-23 (×2): qty 1

## 2024-05-23 MED ORDER — BISACODYL 5 MG PO TBEC
10.0000 mg | DELAYED_RELEASE_TABLET | Freq: Once | ORAL | Status: AC
Start: 2024-05-23 — End: 2024-05-23
  Administered 2024-05-23: 10 mg via ORAL
  Filled 2024-05-23: qty 2

## 2024-05-23 MED ORDER — METOPROLOL SUCCINATE ER 50 MG PO TB24
50.0000 mg | ORAL_TABLET | Freq: Every day | ORAL | Status: AC
  Administered 2024-05-23: 50 mg via ORAL

## 2024-05-23 MED ORDER — METOPROLOL SUCCINATE ER 50 MG PO TB24
50.0000 mg | ORAL_TABLET | Freq: Every day | ORAL | Status: DC
  Filled 2024-05-23: qty 1

## 2024-05-23 MED ORDER — BISACODYL 10 MG RE SUPP
10.0000 mg | Freq: Once | RECTAL | Status: AC
  Administered 2024-05-23: 10 mg via RECTAL
  Filled 2024-05-23: qty 1

## 2024-05-23 MED ORDER — BISACODYL 5 MG PO TBEC: 5.0000 mg | DELAYED_RELEASE_TABLET | Freq: Every day | ORAL | Status: DC | PRN

## 2024-05-23 MED ORDER — METOPROLOL SUCCINATE ER 50 MG PO TB24: 50.0000 mg | ORAL_TABLET | Freq: Every day | ORAL | Status: DC

## 2024-05-23 NOTE — Progress Notes (Signed)
   05/23/24 0019  BiPAP/CPAP/SIPAP  $ Non-Invasive Ventilator  Non-Invasive Vent Subsequent  BiPAP/CPAP/SIPAP Pt Type Adult  BiPAP/CPAP/SIPAP DREAMSTATIOND  Mask Type Full face mask  Mask Size Medium  IPAP 10 cmH20  EPAP 5 cmH2O  FiO2 (%) 21 %  Patient Home Machine No  Patient Home Mask No  Patient Home Tubing No  Auto Titrate No  Device Plugged into RED Power Outlet Yes  BiPAP/CPAP /SiPAP Vitals  Pulse Rate 84  Resp 19  SpO2 96 %  Bilateral Breath Sounds Diminished  MEWS Score/Color  MEWS Score 0  MEWS Score Color [place]

## 2024-05-23 NOTE — Progress Notes (Signed)
 Mobility Specialist Progress Note;   05/23/24 1140  Mobility  Activity Ambulated with assistance  Level of Assistance Standby assist, set-up cues, supervision of patient - no hands on  Assistive Device Front wheel [place]  Distance Ambulated (ft) 275 ft  Activity Response Tolerated well  Mobility Referral Yes  Mobility visit 1 Mobility  Mobility Specialist Start Time (ACUTE ONLY) 1140  Mobility Specialist Stop Time (ACUTE ONLY) 1152  Mobility Specialist Time Calculation (min) (ACUTE ONLY) 12 min   Pt requesting to ambulate. Required no physical assistance during ambulation, SV for safety. Took multiple standing rest breaks d/t overall fatigue. SPO2 98%> throughout. Pt returned to chair and left with all needs met, alarm on.   Lauraine Erm Mobility Specialist Please contact via SecureChat or [employer] [phone]

## 2024-05-23 NOTE — Progress Notes (Signed)
 CCMD called stating that patients pacemaker was misfiring when it was not needed. MD aware of this and states it is not a new change. Patient is not symptomatic. Continuing to monitor patient.   Waddell Pa, RN 05/23/2024, 0922

## 2024-05-23 NOTE — Progress Notes (Signed)
 Physical Therapy Treatment Patient Details Name: Tanya Hogan MRN: [id number] DOB: [birth year] Today's Date: 05/23/2024   History of Present Illness The pt is a 73 y.o. female admitted 9/19 with emesis and found to be in [place] s/p cardioversion on 9/19. S/p heart cath 9/25. PMH: nonischemic cardiomyopathy with EF 20%, VT on amiodarone , h/o complete heart block now s/p BiV pacemaker, HTN, HLD, diabetes    PT Comments  The pt was agreeable to session despite reporting general malaise this morning. The pt was able to complete multiple sit-stand transfers with increased time and remains dependent on BUE support to power up to standing but did not require physical assist. The pt was then able to complete hallway ambulation, but requires frequent standing rest breaks to progress recover due to fatigue. All VSS with exercise. Pt educated on maintaining progressive walking program at home to maintain her ability to complete household distance ambulation. Pt would benefit from post-acute HHPT to guide activity level and challenge to ensure safe progression of activity and balance training. All questions answered, pt hopeful for return home soon.   VITALS:  - sitting in chair - BP: 100/57 (69); HR: 80bpm - standing during ambulation - BP: 110/79 (90); HR: 90bpm - sitting post-ambulation - BP: 93/80 (86); HR: 80bpm HR max: 114bpm    If plan is discharge home, recommend the following: Assist for transportation   Can travel by private vehicle        Equipment Recommendations  None recommended by PT    Recommendations for Other Services       Precautions / Restrictions Precautions Precautions: Fall Recall of Precautions/Restrictions: Intact Restrictions Weight Bearing Restrictions Per Provider Order: No     Mobility  Bed Mobility Overal bed mobility: Needs Assistance             General bed mobility comments: sitting in recliner at start and end of session    Transfers Overall  transfer level: Needs assistance Equipment used: Rolling Kops (2 wheels) Transfers: Sit to/from Stand Sit to Stand: Contact guard assist           General transfer comment: cues for hand placement    Ambulation/Gait Ambulation/Gait assistance: Contact guard assist Gait Distance (Feet): 100 Feet (+ 25 ft + 50 ft + 25 ft) Assistive device: Rolling Chuck (2 wheels) Gait Pattern/deviations: Step-through pattern, Decreased stride length, Drifts right/left Gait velocity: dec Gait velocity interpretation: <1.31 ft/sec, indicative of household ambulator   General Gait Details: frequent standing rest breaks due to fatigue. BP and HR stable. denies change in sx, heart palpitations, or SOB. slowed speed with minimal clearance      Balance Overall balance assessment: Mild deficits observed, not formally tested                                          Communication Communication Communication: No apparent difficulties  Cognition Arousal: Alert Behavior During Therapy: WFL for tasks assessed/performed   PT - Cognitive impairments: No apparent impairments                       PT - Cognition Comments: pt able to follow conversation and engage appropriately. not formally assessed Following commands: Intact      Cueing Cueing Techniques: Verbal cues  Exercises Other Exercises Other Exercises: sit-stand from chair x 3    General Comments General comments (skin  integrity, edema, etc.): BP slightly soft, MAP >69, HR max 114      Pertinent Vitals/Pain Pain Assessment Pain Assessment: No/denies pain     PT Goals (current goals can now be found in the care plan section) Acute Rehab PT Goals Patient Stated Goal: to return home and be able to move PT Goal Formulation: With patient Time For Goal Achievement: 05/31/24 Potential to Achieve Goals: Good Progress towards PT goals: Progressing toward goals    Frequency    Min 2X/week       AM-PAC  PT 6 Clicks Mobility   Outcome Measure  Help needed turning from your back to your side while in a flat bed without using bedrails?: None Help needed moving from lying on your back to sitting on the side of a flat bed without using bedrails?: None Help needed moving to and from a bed to a chair (including a wheelchair)?: A Little Help needed standing up from a chair using your arms (e.g., wheelchair or bedside chair)?: A Little Help needed to walk in hospital room?: A Little Help needed climbing 3-5 steps with a railing? : A Little 6 Click Score: 20    End of Session Equipment Utilized During Treatment: Gait belt Activity Tolerance: Patient tolerated treatment well Patient left: in chair;with Hogan bell/phone within reach;with chair alarm set;with nursing/sitter in room Nurse Communication: Mobility status PT Visit Diagnosis: Difficulty in walking, not elsewhere classified (R26.2)     Time: 9176-9148 PT Time Calculation (min) (ACUTE ONLY): 28 min  Charges:    $Gait Training: 8-22 mins $Therapeutic Exercise: 8-22 mins PT General Charges $$ ACUTE PT VISIT: 1 Visit                     Tanya Hogan, PT, DPT   Acute Rehabilitation Department Office [phone] Secure Chat Communication Preferred   Tanya Hogan 05/23/2024, 10:14 AM

## 2024-05-23 NOTE — Progress Notes (Addendum)
 Pt complained of feeling short of breath. O2sat on RA 100%, RR 18 BP126/74. No obvious signs of distress noted. PRN albuterol  given   0430 assisted pt to chair, O2 applied at 2L/[place] for comfort, sats were 100% on RA prior to placing on [place]. Pt states SOB has resolved and no further complaints voiced.

## 2024-05-23 NOTE — Progress Notes (Signed)
  Patient Name: Tanya Hogan Date of Encounter: 05/23/2024  Primary Cardiologist: Redell Shallow, MD Electrophysiologist: Danelle Birmingham, MD  Interval Summary   The patient is doing well today.  At this time, the patient denies chest pain, shortness of breath, or any new concerns.  Vital Signs    Vitals:   05/22/24 2008 05/22/24 2115 05/23/24 0019 05/23/24 0409  BP: 123/88 104/64  126/74  Pulse:   84   Resp: 20  19 18   Temp: 97.8 F (36.6 C) 97.8 F (36.6 C)  97.6 F (36.4 C)  TempSrc: Oral Oral  Axillary  SpO2:   96%   Weight:      Height:        Intake/Output Summary (Last 24 hours) at 05/23/2024 0748 Last data filed at 05/22/2024 1813 Gross per 24 hour  Intake 480 ml  Output --  Net 480 ml   Filed Weights   05/20/24 0507 05/21/24 0338 05/22/24 0532  Weight: 85.1 kg 85.5 kg 85.7 kg    Physical Exam    GEN- NAD, Alert and oriented  Lungs- Clear to ausculation bilaterally, normal work of breathing Cardiac- Regular rate and rhythm, no murmurs, rubs or gallops GI- soft, NT, ND, + BS Extremities- no clubbing or cyanosis. No edema  Telemetry    AP-VP with occasional isolated PVCs. Rates in the 80s (personally reviewed)  Hospital Course    Tanya Hogan is a 73 y.o. female with a history of HTN, HLD, DM, LBBB, NICM, OSA w/CPAP, CHB, chronic CHF (systolic), w/CRT-P, PVCs, Abandoned RV lead, and breast Ca (s/p mastectomy) who is being seen for the evaluation of VT (4 hours of sustained monomorphic).   Assessment & Plan    Sustained monomorphic VT NSVT PVCs Patient s/p LHC without acute ischemic etiology for VT. Venogram with partial occlusion. Complex case with patient meeting secondary prevention ICD criteria but high risk given partial occlusion, risk for perforation without surgical backup with potential extraction of left bundle lead to allow for defibrillation. Conservative therapy with Amiodarone  and Mexiletine now recommended. Will continue Amiodarone   200mg  BID with Mexiletine 150mg  q12hr.   Complete Heart block s/p Medtronic CRT-P Paced >98% from ventricle. Has abandoned RV lead (placed 2003) following prior RV threshold increase, replaced with left bundle area lead in 2023. Telemetry with stable AP-VP, device interrogation with stable thresholds.   HFrEF Patient with LVEF 20-25% this admission. On cath Fick cardiac output of 5.7 L/min and Fick cardiac index of 3.1 L/min/m. Wedge pressure mean . Initially treated with PO lasix  and push doses of IV lasix . Now on home Torsemide  20mg  and appears euvolemic. Continue Torsemide  20mg , Spironolactone  25mg , Imdur  30mg , Hydralazine  25mg  q8hr.  Diverticulitis Recently treated on Ciprofloxacin . Possibly contributing to VT?  Medical Readiness Date: 05/23/2024    For questions or updates, please contact [facility] Please consult [email or web address] for contact info under     Signed, Artist Pouch, PA-C  05/23/2024, 7:48 AM

## 2024-05-23 NOTE — Plan of Care (Signed)
  Problem: Education: Goal: Knowledge of General Education information will improve Description: Including pain rating scale, medication(s)/side effects and non-pharmacologic comfort measures Outcome: Progressing   Problem: Health Behavior/Discharge Planning: Goal: Ability to manage health-related needs will improve Outcome: Progressing   Problem: Clinical Measurements: Goal: Ability to maintain clinical measurements within normal limits will improve Outcome: Progressing Goal: Will remain free from infection Outcome: Progressing Goal: Diagnostic test results will improve Outcome: Progressing Goal: Respiratory complications will improve Outcome: Progressing Goal: Cardiovascular complication will be avoided Outcome: Progressing   Problem: Activity: Goal: Risk for activity intolerance will decrease Outcome: Progressing   Problem: Nutrition: Goal: Adequate nutrition will be maintained Outcome: Progressing   Problem: Coping: Goal: Level of anxiety will decrease Outcome: Progressing   Problem: Elimination: Goal: Will not experience complications related to bowel motility Outcome: Progressing Goal: Will not experience complications related to urinary retention Outcome: Progressing   Problem: Pain Managment: Goal: General experience of comfort will improve and/or be controlled Outcome: Progressing   Problem: Safety: Goal: Ability to remain free from injury will improve Outcome: Progressing   Problem: Skin Integrity: Goal: Risk for impaired skin integrity will decrease Outcome: Progressing   Problem: Education: Goal: Ability to describe self-care measures that may prevent or decrease complications (Diabetes Survival Skills Education) will improve Outcome: Progressing Goal: Individualized Educational Video(s) Outcome: Progressing   Problem: Coping: Goal: Ability to adjust to condition or change in health will improve Outcome: Progressing   Problem: Fluid  Volume: Goal: Ability to maintain a balanced intake and output will improve Outcome: Progressing   Problem: Health Behavior/Discharge Planning: Goal: Ability to identify and utilize available resources and services will improve Outcome: Progressing Goal: Ability to manage health-related needs will improve Outcome: Progressing   Problem: Metabolic: Goal: Ability to maintain appropriate glucose levels will improve Outcome: Progressing   Problem: Nutritional: Goal: Maintenance of adequate nutrition will improve Outcome: Progressing Goal: Progress toward achieving an optimal weight will improve Outcome: Progressing   Problem: Skin Integrity: Goal: Risk for impaired skin integrity will decrease Outcome: Progressing   Problem: Tissue Perfusion: Goal: Adequacy of tissue perfusion will improve Outcome: Progressing   Problem: Education: Goal: Knowledge of cardiac device and self-care will improve Outcome: Progressing Goal: Ability to safely manage health related needs after discharge will improve Outcome: Progressing Goal: Individualized Educational Video(s) Outcome: Progressing   Problem: Cardiac: Goal: Ability to achieve and maintain adequate cardiopulmonary perfusion will improve Outcome: Progressing   Problem: Education: Goal: Understanding of CV disease, CV risk reduction, and recovery process will improve Outcome: Progressing Goal: Individualized Educational Video(s) Outcome: Progressing   Problem: Activity: Goal: Ability to return to baseline activity level will improve Outcome: Progressing   Problem: Cardiovascular: Goal: Ability to achieve and maintain adequate cardiovascular perfusion will improve Outcome: Progressing Goal: Vascular access site(s) Level 0-1 will be maintained Outcome: Progressing   Problem: Health Behavior/Discharge Planning: Goal: Ability to safely manage health-related needs after discharge will improve Outcome: Progressing

## 2024-05-24 ENCOUNTER — Other Ambulatory Visit (HOSPITAL_COMMUNITY): Payer: Self-pay

## 2024-05-24 LAB — BASIC METABOLIC PANEL WITH GFR
Anion gap: 7 (ref 5–15)
BUN: 20 mg/dL (ref 8–23)
CO2: 23 mmol/L (ref 22–32)
Calcium: 10.1 mg/dL (ref 8.9–10.3)
Chloride: 107 mmol/L (ref 98–111)
Creatinine, Ser: 0.85 mg/dL (ref 0.44–1.00)
GFR, Estimated: >60 mL/min (ref >60)
Glucose, Bld: 107 mg/dL — ABNORMAL HIGH (ref 70–99)
Potassium: 5.3 mmol/L — ABNORMAL HIGH (ref 3.5–5.1)
Sodium: 137 mmol/L (ref 135–145)

## 2024-05-24 LAB — MAGNESIUM: Magnesium: 2 mg/dL (ref 1.7–2.4)

## 2024-05-24 LAB — GLUCOSE, CAPILLARY
Glucose-Capillary: 156 mg/dL — ABNORMAL HIGH (ref 70–99)
Glucose-Capillary: 94 mg/dL (ref 70–99)
Glucose-Capillary: 99 mg/dL (ref 70–99)

## 2024-05-24 MED ORDER — MEXILETINE HCL 150 MG PO CAPS
150.0000 mg | ORAL_CAPSULE | Freq: Two times a day (BID) | ORAL | 5 refills | Status: DC
  Filled 2024-05-24: qty 60, 30d supply, fill #0

## 2024-05-24 MED ORDER — AMIODARONE HCL 200 MG PO TABS
200.0000 mg | ORAL_TABLET | Freq: Two times a day (BID) | ORAL | 0 refills | Status: DC
  Filled 2024-05-24: qty 28, 14d supply, fill #0

## 2024-05-24 MED ORDER — METOPROLOL SUCCINATE ER 50 MG PO TB24
50.0000 mg | ORAL_TABLET | Freq: Two times a day (BID) | ORAL | 5 refills | Status: DC
  Filled 2024-05-24: qty 60, 30d supply, fill #0

## 2024-05-24 NOTE — Care Management Important Message (Signed)
 Important Message  Patient Details  Name: Tanya Hogan MRN: [id number] Date of Birth: [birth year]   Important Message Given:  Yes - Medicare IM     Vonzell Arrie Sharps 05/24/2024, 11:07 AM

## 2024-05-24 NOTE — Progress Notes (Signed)
 Occupational Therapy Treatment Patient Details Name: Tanya Hogan MRN: [id number] DOB: 05/09/1951 Today's Date: 05/24/2024   History of present illness The pt is a 73 y.o. female admitted 9/19 with emesis and found to be in [place] s/p cardioversion on 9/19. S/p heart cath 9/25. PMH: nonischemic cardiomyopathy with EF 20%, VT on amiodarone , h/o complete heart block now s/p BiV pacemaker, HTN, HLD, diabetes   OT comments  Pt progressing toward goals, educated/practiced with sock aid that pt has at home, feels comfortable using for LB ADL. Pt needs supervision overall for transfers with RW, needing x3 brief standing rest breaks with hallway ambulation. Pt presenting with impairments listed below, will follow acutely. Continue to recommend HHOT at d/c.       If plan is discharge home, recommend the following:  A little help with walking and/or transfers;A little help with bathing/dressing/bathroom;Assistance with cooking/housework;Assist for transportation;Help with stairs or ramp for entrance   Equipment Recommendations  None recommended by OT    Recommendations for Other Services PT consult    Precautions / Restrictions Precautions Precautions: Fall Recall of Precautions/Restrictions: Intact Restrictions Weight Bearing Restrictions Per Provider Order: No       Mobility Bed Mobility Overal bed mobility: Needs Assistance Bed Mobility: Sit to Supine       Sit to supine: Contact guard assist   General bed mobility comments: incr time to lift legs into bed    Transfers Overall transfer level: Needs assistance Equipment used: Rolling Prashad (2 wheels) Transfers: Sit to/from Stand Sit to Stand: Supervision                 Balance Overall balance assessment: Mild deficits observed, not formally tested                                         ADL either performed or assessed with clinical judgement   ADL Overall ADL's : Needs assistance/impaired                      Lower Body Dressing: Contact guard assist;With adaptive equipment Lower Body Dressing Details (indicate cue type and reason): wtih sock aid, doffs socks using feet Toilet Transfer: Supervision/safety;Ambulation;Rolling [place] (2 wheels);Regular Toilet   Toileting- Clothing Manipulation and Hygiene: Supervision/safety       Functional mobility during ADLs: Supervision/safety;Rolling B[place]i (2 wheels)      Extremity/Trunk Assessment Upper Extremity Assessment Upper Extremity Assessment: Generalized weakness   Lower Extremity Assessment Lower Extremity Assessment: Defer to PT evaluation        Vision   Vision Assessment?: No apparent visual deficits   Perception Perception Perception: Not tested   Praxis Praxis Praxis: Not tested   Communication Communication Communication: No apparent difficulties   Cognition Arousal: Alert Behavior During Therapy: WFL for tasks assessed/performed Cognition: No apparent impairments                               Following commands: Intact        Cueing   Cueing Techniques: Verbal cues  Exercises      Shoulder Instructions       General Comments VSS    Pertinent Vitals/ Pain       Pain Assessment Pain Assessment: No/denies pain  Home Living  Prior Functioning/Environment              Frequency  Min 2X/week        Progress Toward Goals  OT Goals(current goals can now be found in the care plan section)  Progress towards OT goals: Progressing toward goals  Acute Rehab OT Goals OT Goal Formulation: With patient Time For Goal Achievement: 05/31/24 Potential to Achieve Goals: Good ADL Goals Pt Will Perform Lower Body Bathing: with modified independence Pt Will Perform Lower Body Dressing: with modified independence Pt Will Transfer to Toilet: with modified independence  Plan      Co-evaluation                  AM-PAC OT 6 Clicks Daily Activity     Outcome Measure   Help from another person eating meals?: None Help from another person taking care of personal grooming?: A Little Help from another person toileting, which includes using toliet, bedpan, or urinal?: A Little Help from another person bathing (including washing, rinsing, drying)?: A Little Help from another person to put on and taking off regular upper body clothing?: A Little Help from another person to put on and taking off regular lower body clothing?: A Little 6 Click Score: 19    End of Session Equipment Utilized During Treatment: Rolling [place]buck (2 wheels)  OT Visit Diagnosis: Unsteadiness on feet (R26.81);Muscle weakness (generalized) (M62.81)   Activity Tolerance Patient tolerated treatment well   Patient Left in bed;with bed alarm set;with call bell/phone within reach   Nurse Communication Mobility status;Other (comment) (pt resting in bed on CPAP)        Time: 8695-8674 OT Time Calculation (min): 21 min  Charges: OT General Charges $OT Visit: 1 Visit OT Treatments $Self Care/Home Management : 8-22 mins  Tacori Kvamme K, OTD, OTR/L SecureChat Preferred Acute Rehab [phone]   [name] 05/24/2024, 1:29 PM

## 2024-05-24 NOTE — Progress Notes (Signed)
 Telemetry reviewed with Dr. Kennyth Abbott VT has settled with current programming meds. Intermittently has PVCs/trigeminal She had a small BM overnight Feels well.  Will monitor this morning, if tele remains stable plan to discharge this afternoon Full note/summary to follow

## 2024-05-24 NOTE — Plan of Care (Signed)
  Problem: Education: Goal: Knowledge of General Education information will improve Description: Including pain rating scale, medication(s)/side effects and non-pharmacologic comfort measures Outcome: Progressing   Problem: Health Behavior/Discharge Planning: Goal: Ability to manage health-related needs will improve Outcome: Progressing   Problem: Clinical Measurements: Goal: Ability to maintain clinical measurements within normal limits will improve Outcome: Progressing Goal: Will remain free from infection Outcome: Progressing Goal: Diagnostic test results will improve Outcome: Progressing Goal: Respiratory complications will improve Outcome: Progressing Goal: Cardiovascular complication will be avoided Outcome: Progressing   Problem: Activity: Goal: Risk for activity intolerance will decrease Outcome: Progressing   Problem: Nutrition: Goal: Adequate nutrition will be maintained Outcome: Progressing   Problem: Coping: Goal: Level of anxiety will decrease Outcome: Progressing   Problem: Elimination: Goal: Will not experience complications related to bowel motility Outcome: Progressing Goal: Will not experience complications related to urinary retention Outcome: Progressing   Problem: Pain Managment: Goal: General experience of comfort will improve and/or be controlled Outcome: Progressing   Problem: Education: Goal: Ability to describe self-care measures that may prevent or decrease complications (Diabetes Survival Skills Education) will improve Outcome: Progressing Goal: Individualized Educational Video(s) Outcome: Progressing   Problem: Coping: Goal: Ability to adjust to condition or change in health will improve Outcome: Progressing   Problem: Fluid Volume: Goal: Ability to maintain a balanced intake and output will improve Outcome: Progressing

## 2024-05-24 NOTE — Discharge Summary (Signed)
 DISCHARGE SUMMARY    Patient ID: Tanya Hogan,  MRN: [id number], DOB/AGE: 1951/01/16 73 y.o.  Admit date: 05/12/2024 Discharge date: 05/24/2024  Primary Care Physician: [name], MD  Primary Cardiologist: Dr. Pietro Electrophysiologist: Dr. Waddell >> Dr Almetta  Primary Discharge Diagnosis:  VT PVCs  Secondary Discharge Diagnosis:  CHB w/PPM OSA DM NICM Chronic CHF  Allergies  Allergen Reactions   Injectafer [Ferric Carboxymaltose] Shortness Of Breath    Mild SOB following injectafer infusion Mild SOB following injectafer infusion    Penicillins Anaphylaxis and Rash    Has patient had a PCN reaction causing immediate rash, facial/tongue/throat swelling, SOB or lightheadedness with hypotension: Yes Has patient had a PCN reaction causing severe rash involving mucus membranes or skin necrosis: No Has patient had a PCN reaction that required hospitalization: No Has patient had a PCN reaction occurring within the last 10 years: No If all of the above answers are NO, then may proceed with Cephalosporin use. Stuttered with medication     Accupril [Quinapril Hcl] Other (See Comments)    Hyperkalemia=high potassium level   Cozaar  [Losartan  Potassium] Itching   Dml Forte Rash   Entresto [Sacubitril-Valsartan] Other (See Comments)    Hyperkalemia   Flonase  [Fluticasone ] Other (See Comments)    Nasal ulcer.     Lactose Intolerance (Gi) Other (See Comments)    Increased calcium , sneezing, nasal issues   Lipitor [Atorvastatin ] Other (See Comments)    muscle aches/pains   Nasonex  [Mometasone  Furoate] Other (See Comments)    nosebleed   Nsaids Other (See Comments)    Contraindication due to ulcers and coughing up blood    Aspirin  Other (See Comments)    Pt states burns holes in stomach, ulcers    Baking Soda-Fluoride [Sodium Fluoride] Itching and Rash   Chamomile Hives   Clindamycin /Lincomycin Rash   Codeine Nausea And Vomiting   Furosemide   Other (See Comments)    Ears ring, drops bp quickly. Is okay to take smaller doses.    Lanolin-Petrolatum Rash   Miralax  [Polyethylene Glycol] Rash   Quinine Rash   Reclast  [Zoledronic  Acid] Rash and Other (See Comments)    shaking   Rifampin Other (See Comments)    DOES NOT REMEMBER THIS MEDICATION OR ALLERGY   Singulair  [Montelukast ] Other (See Comments)    dizziness   Sulfa Antibiotics Rash   Tramadol  Rash and Other (See Comments)    Anxiety   Vesicare  [Solifenacin ] Other (See Comments)    stopped me from urinating   Wellbutrin  [Bupropion ] Other (See Comments)    Muscle aches, pain, made my skin crawl   Wool Alcohol [Lanolin] Rash     Procedures This Admission:  05/18/24: R/LHC (Dr. Wendel) 1.  Normal right dominant circulation with no obstructive coronary artery disease. 2.  Venography from the left axillary demonstrated that the subclavian vein proximal to the insertion site of multiple leads is patent however a wire and catheter could not cross the this confluence. 3.  Fick cardiac output of 5.7 L/min and Fick cardiac index of 3.1 L/min/m with following hemodynamics:            Right atrial pressure mean of 8 mmHg            Right ventricular pressure 52/1 with an end-diastolic pressure of 10 mmHg            Wedge pressure mean of 23 mmHg with V waves to 29 mmHg  PA pressure of 51/27 with mean of 37 mmHg            PVR of PVR of 2.5 Woods units            PA pulsatility index of 3 4.  LVEDP of 21 to 24 mmHg across the cardiac cycle   Recommendation: Medical therapy  Brief HPI: Tanya Hogan is a 73 y.o. female sought medical attention with onset of palpitations, racing, feeling poorly, weak, suspected she had recurrent tachycardia and came in. She was found in VT rates 180's, initially treaed with Amio bolus (s) > ultimately required external CV.  Hospital Course:  The patient was admitted started on amiodarone  gtt.  She was recently on antibiotic for  a colitis, thoughts perhaps this may have prolonged QT > provoked, though was MMVT and likely not the cause.  RV pacing rate was increased to 70bpm and then 80bpm, to try and supress frequent NSVTs Labs were unrevealing. AHF and EP teams engaged, started on mexiletine. Several discussions regarding options for management TTE w/LVEF 20-25%, global HK, no significant VHD R/LHC with no obstructive CAD L axillary venography noted proximal to the insertion site of multiple leads is patent however a wire and catheter could not cross the this confluence.  Ultimately after much discussion with EP team/partners as well as [facility] EP/extraction MD,  She has known hx of VT, with prior discussion of PPM upgrade to CRT though not felt to be a good candiate for the procedure, with 4 leads in currently, one abandoned, would require extraction. Discussed that up to this point her VT has been hemodynamically tolerated so exposing her to the risk of going back to her previous pocket may just result in additional shocks at home. The benefit of upgrading her device would be primarily for ATP so I do not think addition of something like an SICD would be that helpful in her situation. If no recurrent VT/VF plan to continue amiodarone  400 mg twice daily load followed by 400 mg daily with mexiletine 150 mg twice daily.   05/23/24 she had a rise in V ectopy nonsustained V rhythm as well as sustained (rates 80's), and well tolerated  Her coreg  changed to Toprol, her pacing rate further increased to 90bpm with marked improvement.  No further slow VTs, intermittently has PVCs in a trigeminal pattern.  The patient feels well, denies any CP/SOB, she is ambulating in the room independently without difficulty w/Glaze.  She was examined by Dr. Almetta and considered stable for discharge to home.   Mild hyperkalemia > prompted stopping spironolactone  Home with  Amiodarone  increased from 200mg  daily home dose > 200mg  BID  until seen  in the clinic ~ 2 weeks Off coreg  > Toprol 50mg  BID Mexiletine 150mg  BID   EP and AHF team follow up is in place No driving 2mo has been discussed with the patient   Physical Exam: Vitals:   05/24/24 0538 05/24/24 0804 05/24/24 0833 05/24/24 1137  BP: (!) 107/57 110/63  102/66  Pulse: 89 89  88  Resp: 20 (!) 24  (!) 24  Temp: 97.9 F (36.6 C) 97.9 F (36.6 C)  97.7 F (36.5 C)  TempSrc: Axillary Oral  Oral  SpO2: 99% 98% 98% 99%  Weight: 84.5 kg     Height:        GEN- The patient is well appearing, alert and oriented x 3 today.   HEENT: normocephalic, atraumatic; sclera clear, conjunctiva pink; hearing intact; oropharynx  clear; neck supple, no JVP Lungs-  CTA b/l, normal work of breathing.  No wheezes, rales, rhonchi Heart- RRR, no murmurs, rubs or gallops, PMI not laterally displaced GI- soft, non-tender, non-distended Extremities- no clubbing, cyanosis, or edema MS- no significant deformity or atrophy Skin- warm and dry, no rash or lesion Psych- euthymic mood, full affect Neuro- no gross deficits   Labs:   Lab Results  Component Value Date   WBC 8.3 05/17/2024   HGB 13.3 05/18/2024   HCT 39.0 05/18/2024   MCV 97.5 05/17/2024   PLT 289 05/17/2024    Recent Labs  Lab 05/24/24 0452  NA 137  K 5.3*  CL 107  CO2 23  BUN 20  CREATININE 0.85  CALCIUM  10.1  GLUCOSE 107*    Discharge Medications:  Allergies as of 05/24/2024       Reactions   Injectafer [ferric Carboxymaltose] Shortness Of Breath   Mild SOB following injectafer infusion Mild SOB following injectafer infusion   Penicillins Anaphylaxis, Rash   Has patient had a PCN reaction causing immediate rash, facial/tongue/throat swelling, SOB or lightheadedness with hypotension: Yes Has patient had a PCN reaction causing severe rash involving mucus membranes or skin necrosis: No Has patient had a PCN reaction that required hospitalization: No Has patient had a PCN reaction occurring within the last  10 years: No If all of the above answers are NO, then may proceed with Cephalosporin use. Stuttered with medication   Accupril [quinapril Hcl] Other (See Comments)   Hyperkalemia=high potassium level   Cozaar  [losartan  Potassium] Itching   Dml Forte Rash   Entresto [sacubitril-valsartan] Other (See Comments)   Hyperkalemia   Flonase  [fluticasone ] Other (See Comments)   Nasal ulcer.     Lactose Intolerance (gi) Other (See Comments)   Increased calcium , sneezing, nasal issues   Lipitor [atorvastatin ] Other (See Comments)   muscle aches/pains   Nasonex  [mometasone  Furoate] Other (See Comments)   nosebleed   Nsaids Other (See Comments)   Contraindication due to ulcers and coughing up blood    Aspirin  Other (See Comments)   Pt states burns holes in stomach, ulcers    Baking Soda-fluoride [sodium Fluoride] Itching, Rash   Chamomile Hives   Clindamycin /lincomycin Rash   Codeine Nausea And Vomiting   Furosemide  Other (See Comments)   Ears ring, drops bp quickly. Is okay to take smaller doses.    Lanolin-petrolatum Rash   Miralax  [polyethylene Glycol] Rash   Quinine Rash   Reclast  [zoledronic  Acid] Rash, Other (See Comments)   shaking   Rifampin Other (See Comments)   DOES NOT REMEMBER THIS MEDICATION OR ALLERGY   Singulair  [montelukast ] Other (See Comments)   dizziness   Sulfa Antibiotics Rash   Tramadol  Rash, Other (See Comments)   Anxiety   Vesicare  [solifenacin ] Other (See Comments)   stopped me from urinating   Wellbutrin  [bupropion ] Other (See Comments)   Muscle aches, pain, made my skin crawl   Wool Alcohol [lanolin] Rash        Medication List     PAUSE taking these medications    amiodarone  200 MG tablet Wait to take this until: June 08, 2024 Commonly known as: PACERONE  Take 1 tablet (200 mg total) by mouth daily. You also have another medication with the same name that you may need to continue taking.       STOP taking these medications     carvedilol  3.125 MG tablet Commonly known as: COREG    ciprofloxacin  500 MG/5ML (10%) suspension Commonly  known as: CIPRO    metroNIDAZOLE  500 MG tablet Commonly known as: FLAGYL    spironolactone  25 MG tablet Commonly known as: ALDACTONE        TAKE these medications    acetaminophen  325 MG tablet Commonly known as: TYLENOL  Take 325 mg by mouth every 6 (six) hours as needed for mild pain (pain score 1-3).   albuterol  (2.5 MG/3ML) 0.083% nebulizer solution Commonly known as: PROVENTIL  Take 2.5 mg by nebulization every 6 (six) hours as needed for wheezing or shortness of breath.   alendronate  70 MG tablet Commonly known as: FOSAMAX  TAKE 1 TABLET BY MOUTH EVERY 7  DAYS WITH FULL GLASS OF WATER  ON AN EMPTY STOMACH   amiodarone  200 MG tablet Commonly known as: PACERONE  Take 1 tablet (200 mg total) by mouth 2 (two) times daily for 14 days. What changed: Another medication with the same name was paused. Ask your nurse or doctor if you should take this medication.   aspirin  81 MG chewable tablet Chew 81 mg by mouth daily.   Calcium  600+D Plus Minerals 600-400 MG-UNIT Tabs 1 tab p.o. twice daily   EPINEPHrine  0.3 mg/0.3 mL Soaj injection Commonly known as: EPI-PEN Inject 0.3 mg into the muscle as needed for anaphylaxis.   estradiol 0.1 MG/GM vaginal cream Commonly known as: ESTRACE Place 1 Applicatorful vaginally 3 (three) times a week. What changed: when to take this   fexofenadine  180 MG tablet Commonly known as: ALLEGRA  Take 180 mg by mouth daily.   FreeStyle Libre 3 Reader [employer] Use for continuous glucose monitoring   FreeStyle Libre 3 Sensor Misc by Does not apply route. Change every 14 days   Gemtesa 75 MG Tabs Generic drug: Vibegron Take 75 mg by mouth daily.   glucose blood test strip To be used to check blood glucose Dx:E11.8 FreeStyle Precision blood glucose for Freestyle Libre 2   isosorbide  mononitrate 30 MG 24 hr tablet Commonly known as:  IMDUR  Take 1 tablet (30 mg total) by mouth daily.   levothyroxine  112 MCG tablet Commonly known as: SYNTHROID  TAKE 1 TABLET BY MOUTH DAILY   lubiprostone 24 MCG capsule Commonly known as: AMITIZA Take 24 mcg by mouth 2 (two) times daily with a meal. Patient reports provider has decreased to every three days.   metFORMIN  500 MG 24 hr tablet Commonly known as: GLUCOPHAGE -XR TAKE 1 TABLET BY MOUTH TWICE  DAILY WITH MEALS   metoprolol succinate 50 MG 24 hr tablet Commonly known as: TOPROL-XL Take 1 tablet (50 mg total) by mouth 2 (two) times daily. Take with or immediately following a meal.   mexiletine 150 MG capsule Commonly known as: MEXITIL  Take 1 capsule (150 mg total) by mouth every 12 (twelve) hours.   Ocuvite Eye + Multi Tabs Take 1 tablet by mouth in the morning. What changed: Another medication with the same name was changed. Make sure you understand how and when to take each.   MULTIPLE VITAMINS/WOMENS PO Take 1 tablet by mouth daily. What changed: additional instructions   pantoprazole  40 MG tablet Commonly known as: PROTONIX  TAKE 1 TABLET BY MOUTH DAILY  BEFORE BREAKFAST What changed: when to take this   pravastatin  40 MG tablet Commonly known as: PRAVACHOL  Take 40 mg by mouth daily.   Spiriva  Respimat 1.25 MCG/ACT Aers Generic drug: Tiotropium Bromide  Monohydrate Inhale 1 puff into the lungs daily.   torsemide  20 MG tablet Commonly known as: DEMADEX  Take 20 mg by mouth daily.   valACYclovir  1000 MG tablet Commonly known as:  VALTREX  Take 1 tablet (1,000 mg total) by mouth 2 (two) times daily.   Wixela Inhub  250-50 MCG/ACT Aepb Generic drug: fluticasone -salmeterol Inhale 1 puff into the lungs in the morning and at bedtime.        Disposition: Home Discharge Instructions     Diet - low sodium heart healthy   Complete by: As directed    Increase activity slowly   Complete by: As directed        Follow-up Information     [facility] Follow [facility].   Specialty: Home Health Services Why: Home Health Physical Therapy and Aide-office to call with visit times. Contact information: [address] [place] [address] [phone]         [facility] Follow up on 06/02/2024.   Specialty: Cardiology Why: 10/10 at 9:00 Contact information: [address] [place]  [address] [phone]                Duration of Discharge Encounter: 18 minutes, APP time  Bonney Charlies Arthur, PA-C 05/24/2024 3:40 PM

## 2024-05-24 NOTE — Discharge Instructions (Signed)
No driving 6 months as discussed

## 2024-05-25 ENCOUNTER — Other Ambulatory Visit: Payer: Self-pay | Admitting: Cardiology

## 2024-05-25 ENCOUNTER — Other Ambulatory Visit: Payer: Self-pay | Admitting: Family Medicine

## 2024-05-25 ENCOUNTER — Telehealth: Payer: Self-pay

## 2024-05-25 NOTE — Transitions of Care (Post Inpatient/ED Visit) (Signed)
 05/25/2024  TOC RN spoke with patient who had not heard from home health - call was placed to [facility] at [phone] spoke with jess who said they did not get a referral and she is calling her contact with [facility] to obtain - TOC RN attempted medication review but patient was looking at her discharge summary from her [facility] discharge 04/1724 and was getting upset that she could not find the [facility] discharge papers from 05/24/24. We made a conference call to her daughter [name] at [phone] who states she left the papers in the home and that they are there. Daughter states she is currently at work and can't leave but states she will be at the home after work and will go over all of the medications and ensure patient has the paperwork and correct medication. Patient then said she had found the correct discharge papers and was getting emotional and said she just wanted to lay down. TOC RN advised patient and daughter that this RN is out of the office tomorrow and Monday but another nurse will call to follow up. Both were agreeable. TOC RN did not have the change to discuss assessments, unable to review medications, SDOH, and was unable to discuss TOC enrollment. Will request assistance from the [facility] team to follow up 05/26/24  Patient ID: Tanya Hogan Finder, female   DOB: [birth year], 73 y.o.   MRN: [id number] Shona Prow RN, CCM [place]  VBCI-Population Health RN Care Manager [phone]

## 2024-05-25 NOTE — Transitions of Care (Post Inpatient/ED Visit) (Signed)
   05/25/2024  Name: Tanya Hogan MRN: [id number] DOB: Nov 07, 1950  Today's TOC FU Call Status: Today's TOC FU Call Status:: Unsuccessful Call (1st Attempt) Unsuccessful Call (1st Attempt) Date: 05/25/24  Attempted to reach the patient regarding the most recent Inpatient/ED visit.  Follow Up Plan: Additional outreach attempts will be made to reach the patient to complete the Transitions of Care (Post Inpatient/ED visit) call.  Shona Prow RN, CCM [place]  VBCI-Population Health RN Care Manager [phone]

## 2024-05-26 ENCOUNTER — Telehealth: Payer: Self-pay

## 2024-05-26 NOTE — Transitions of Care (Post Inpatient/ED Visit) (Signed)
 05/26/2024  Name: Tanya Hogan MRN: [id number] DOB: 30-Apr-1951  Today's TOC FU Call Status: Today's TOC FU Call Status:: Successful TOC FU Call Completed TOC FU Call Complete Date: 05/26/24 Patient's Name and Date of Birth confirmed.  Transition Care Management Follow-up Telephone Call Date of Discharge: 05/24/24 Discharge Facility: [name]) Type of Discharge: Inpatient Admission Primary Inpatient Discharge Diagnosis:: V-Tach; PVC's How have you been since you were released from the hospital?: Better Any questions or concerns?: Yes Patient Questions/Concerns:: Medications and the discharge summary Patient Questions/Concerns Addressed: Notified Provider of Patient Questions/Concerns, Other: (Scheduled PCP appointment)  Items Reviewed: Did you receive and understand the discharge instructions provided?: No (Needed review today. The patient was confused) Medications obtained,verified, and reconciled?: Yes (Medications Reviewed) Any new allergies since your discharge?: No Dietary orders reviewed?: Yes Type of Diet Ordered:: Low Sodium Heart Healthy Do you have support at home?: Yes People in Home [RPT]: sibling(s) Name of Support/Comfort Primary Source: Lives with her brother Tanya Hogan who has Parkinson's but her daughter Tanya Hogan is her primary support  Medications Reviewed Today: Medications Reviewed Today     Reviewed by Moises Reusing, RN (Case Manager) on 05/26/24 at 1201  Med List Status: <None>   Medication Order Taking? Sig Documenting Provider Last Dose Status Informant  acetaminophen  (TYLENOL ) 325 MG tablet [id number]  Take 325 mg by mouth every 6 (six) hours as needed for mild pain (pain score 1-3). [provider]  Active Self, Pharmacy Records, Multiple Informants  albuterol  (PROVENTIL ) (2.5 MG/3ML) 0.083% nebulizer solution [id number]  Take 2.5 mg by nebulization every 6 (six) hours as needed for wheezing or shortness of breath. [provider]   Active Self, Pharmacy Records, Multiple Informants  alendronate  (FOSAMAX ) 70 MG tablet [id number]  TAKE 1 TABLET BY MOUTH EVERY 7  DAYS WITH FULL GLASS OF WATER  ON AN EMPTY STOMACH [name], MD  Active   amiodarone  (PACERONE ) 200 MG tablet [id number]  Take 1 tablet (200 mg total) by mouth daily. Pietro Redell GORMAN, MD  Active Self, Pharmacy Records, Multiple Informants  amiodarone  (PACERONE ) 200 MG tablet [id number]  Take 1 tablet (200 mg total) by mouth 2 (two) times daily for 14 days. Leverne Charlies Helling, PA-C  Active   aspirin  81 MG chewable tablet [id number]  Chew 81 mg by mouth daily. [provider]  Active Self, Pharmacy Records, Multiple Informants  Calcium  Carbonate-Vit D-Min (CALCIUM  600+D PLUS MINERALS) 600-400 MG-UNIT TABS [id number]  1 tab p.o. twice daily  Patient not taking: Reported on 05/13/2024   Curtis Debby PARAS, MD  Active Self, Pharmacy Records, Multiple Informants  Continuous Glucose Receiver (FREESTYLE LIBRE 3 READER) [place] [id number]  Use for continuous glucose monitoring [name], MD  Active Self, Pharmacy Records, Multiple Informants  Continuous Glucose Sensor (FREESTYLE LIBRE 3 [place]) [place] [id number]  by Does not apply route. Change every 14 days [provider]  Active Self, Pharmacy Records, Multiple Informants           Med Note JACKOLYN, [place] R   Thu Dec 09, 2023 10:26 AM)    EPINEPHrine  0.3 mg/0.3 mL IJ SOAJ injection [id number]  Inject 0.3 mg into the muscle as needed for anaphylaxis. [name], MD  Active Self, Pharmacy Records, Multiple Informants  estradiol (ESTRACE) 0.1 MG/GM vaginal cream [id number]  Place 1 Applicatorful vaginally 3 (three) times a week.  Patient taking differently: Place 1 Applicatorful vaginally once a week.   [provider]  Active Self, Pharmacy Records, Multiple  Informants  fexofenadine  (ALLEGRA ) 180 MG tablet [id number]  Take 180 mg by mouth daily. [provider]   Active Self, Pharmacy Records, Multiple Informants  fluticasone -salmeterol (WIXELA INHUB ) 250-50 MCG/ACT AEPB [id number]  Inhale 1 puff into the lungs in the morning and at bedtime. [provider]  Active Self, Pharmacy Records, Multiple Informants  GEMTESA 75 MG TABS [id number]  Take 75 mg by mouth daily. [provider]  Active Self, Pharmacy Records, Multiple Informants  glucose blood test strip [id number]  To be used to check blood glucose Dx:E11.8 FreeStyle Precision blood glucose for Freestyle Libre 2 [name], MD  Active Self, Pharmacy Records, Multiple Informants  isosorbide  mononitrate (IMDUR ) 30 MG 24 hr tablet [id number]  TAKE 1 TABLET BY MOUTH DAILY Crenshaw, Brian S, MD  Active   levothyroxine  (SYNTHROID ) 112 MCG tablet [id number]  TAKE 1 TABLET BY MOUTH DAILY [name], MD  Active Self, Pharmacy Records, Multiple Informants  lubiprostone (AMITIZA) 24 MCG capsule [id number] Yes Take 24 mcg by mouth 2 (two) times daily with a meal. Patient reports provider has decreased to every three days.  Patient taking differently: Take 24 mcg by mouth every 3 (three) days. Patient reports provider has decreased to every three days.   [provider]  Active Self, Pharmacy Records, Multiple Informants  metFORMIN  (GLUCOPHAGE -XR) 500 MG 24 hr tablet [id number]  TAKE 1 TABLET BY MOUTH TWICE  DAILY WITH MEALS [name], MD  Active Self, Pharmacy Records, Multiple Informants           Med Note STEFFI, ADELITA   Sat May 13, 2024 10:38 AM) Patient is taking the extended release twice a day.   metoprolol succinate (TOPROL-XL) 50 MG 24 hr tablet [id number]  Take 1 tablet (50 mg total) by mouth 2 (two) times daily. Take with or immediately following a meal. Leverne Charlies Helling, PA-C  Active   mexiletine (MEXITIL ) 150 MG capsule [id number]  Take 1 capsule (150 mg total) by mouth every 12 (twelve) hours. Leverne Charlies Helling, PA-C  Active   Multiple  Vitamins-Minerals (MULTIPLE VITAMINS/WOMENS PO) [id number]  Take 1 tablet by mouth daily.  Patient taking differently: Take 1 tablet by mouth daily. Patient takes MVI with iron   [provider]  Active Self, Pharmacy Records, Multiple Informants  Multiple Vitamins-Minerals (OCUVITE EYE + MULTI) TABS [id number]  Take 1 tablet by mouth in the morning. [provider]  Active Self, Pharmacy Records, Multiple Informants  pantoprazole  (PROTONIX ) 40 MG tablet [id number]  TAKE 1 TABLET BY MOUTH DAILY  BEFORE BREAKFAST  Patient taking differently: Take 40 mg by mouth 2 (two) times daily.   [name], MD  Active Self, Pharmacy Records, Multiple Informants           Med Note [facility], [place] R   Thu Dec 09, 2023 10:26 AM)    pravastatin  (PRAVACHOL ) 40 MG tablet [id number]  Take 40 mg by mouth daily. [provider]  Active Self, Pharmacy Records, Multiple Informants  Tiotropium Bromide  Monohydrate (SPIRIVA  RESPIMAT) 1.25 MCG/ACT AERS [id number]  Inhale 1 puff into the lungs daily. [provider]  Active Self, Pharmacy Records, Multiple Informants  torsemide  (DEMADEX ) 20 MG tablet [id number]  TAKE 1 TABLET BY MOUTH DAILY Pietro, Redell RAMAN, MD  Active   valACYclovir  (VALTREX ) 1000 MG tablet [id number]  Take 1 tablet (1,000 mg total) by mouth 2 (two) times daily.  Patient not taking: Reported on 05/13/2024   Alvia Bring, DO  Active Self, Pharmacy  Records, Multiple Informants            Home Care and Equipment/Supplies: Were Home Health Services Ordered?: Yes Name of Home Health Agency:: [facility] Has Agency set up a time to come to your home?: No EMR reviewed for Home Health Orders: Orders present/patient has not received call (refer to CM for follow-up) Any new equipment or medical supplies ordered?: No  Functional Questionnaire: Do you need assistance with bathing/showering or dressing?: No Do you need assistance with meal preparation?: Yes (The  patient has MOW) Do you need assistance with eating?: No Do you have difficulty maintaining continence: Yes (Wears Depends) Do you need assistance with getting out of bed/getting out of a chair/moving?: No Do you have difficulty managing or taking your medications?: No  Follow up appointments reviewed: PCP Follow-up appointment confirmed?: Yes Date of PCP follow-up appointment?: 05/31/24 Follow-up Provider: Dorothyann Baron Specialist [facility] Follow-up appointment confirmed?: Yes Date of Specialist follow-up appointment?: 06/02/24 Follow-Up Specialty Provider:: [facility] Do you need transportation to your follow-up appointment?: No Do you understand care options if your condition(s) worsen?: Yes-patient verbalized understanding  SDOH Interventions Today    Flowsheet Row Most Recent Value  SDOH Interventions   Food Insecurity Interventions Intervention Not Indicated  Housing Interventions Intervention Not Indicated  Transportation Interventions Intervention Not Indicated  Utilities Interventions Intervention Not Indicated    Goals Addressed             This Visit's Progress    VBCI Transitions of Care (TOC) Care Plan       Problems:  Recent Hospitalization for treatment of CHF and V-Tach Hospital or ED Adm Risk is 97%  Goal:  Over the next 30 days, the patient will not experience hospital readmission  Interventions:   Heart Failure Interventions: Provided education on low sodium diet Provided education about placing scale on hard, flat surface Advised patient to weigh each morning after emptying bladder Discussed importance of daily weight and advised patient to weigh and record daily Reviewed role of diuretics in prevention of fluid overload and management of heart failure; Discussed the importance of keeping all appointments with provider Provided patient with education about the role of exercise in the management of heart failure Report weight gain of  more than  two pounds in a day and 3-5 pounds in a week.   Evaluation of current treatment plan related to T-Tach with PVC's, self-management and patient's adherence to plan as established by provider. Discussed plans with patient for ongoing care management follow up and provided patient with direct contact information for care management team Advised patient to provide appropriate vaccination information to provider or care management team member at next visit Reviewed medications with patient and discussed New Medications that were ordered: Metoprolol and Mixelitine Advised patient to discuss Paused Amiodorone 200mg  twice daily with provider Assessed social determinant of health barriers The patient lives with her brother who has Parkinson's and now has two broken legs and is immobile and incontinent. The patient cannot help him. They have hired help for one hour three times a day for the patient to be changed and cleaned. They do have MOW The patient is unable to drive for six months. Recommended [employer] transportation service to assist with provider appointments.   Patient Self Care Activities:  Attend all scheduled provider appointments Call pharmacy for medication refills 3-7 days in advance of running out of medications Call provider office for new concerns or questions  Notify RN Care Manager of [facility] call rescheduling needs  Participate in Transition of Care Program/Attend TOC scheduled calls Perform all self care activities independently  Take medications as prescribed    Plan:  Telephone follow up appointment with care management team member scheduled for:  Thursday October 9th at 1:00pm       Medford Balboa, BSN, RN [place]  VBCI - [facility] RN Care Manager [phone]

## 2024-05-26 NOTE — Patient Instructions (Signed)
 Visit Information  Thank you for taking time to visit with me today. Please don't hesitate to contact me if I can be of assistance to you before our next scheduled telephone appointment.  Our next appointment is by telephone on Thursday October 9th at 1:00pm  Following is a copy of your care plan:   Goals Addressed             This Visit's Progress    VBCI Transitions of Care (TOC) Care Plan       Problems:  Recent Hospitalization for treatment of CHF and V-Tach Hospital or ED Adm Risk is 97%  Goal:  Over the next 30 days, the patient will not experience hospital readmission  Interventions:   Heart Failure Interventions: Provided education on low sodium diet Provided education about placing scale on hard, flat surface Advised patient to weigh each morning after emptying bladder Discussed importance of daily weight and advised patient to weigh and record daily Reviewed role of diuretics in prevention of fluid overload and management of heart failure; Discussed the importance of keeping all appointments with provider Provided patient with education about the role of exercise in the management of heart failure Report weight gain of  more than two pounds in a day and 3-5 pounds in a week.   Evaluation of current treatment plan related to T-Tach with PVC's, self-management and patient's adherence to plan as established by provider. Discussed plans with patient for ongoing care management follow up and provided patient with direct contact information for care management team Advised patient to provide appropriate vaccination information to provider or care management team member at next visit Reviewed medications with patient and discussed New Medications that were ordered: Metoprolol and Mixelitine Advised patient to discuss Paused Amiodorone 200mg  twice daily with provider Assessed social determinant of health barriers The patient lives with her brother who has Parkinson's and now  has two broken legs and is immobile and incontinent. The patient cannot help him. They have hired help for one hour three times a day for the patient to be changed and cleaned. They do have MOW The patient is unable to drive for six months. Recommended [employer] transportation service to assist with provider appointments.   Patient Self Care Activities:  Attend all scheduled provider appointments Call pharmacy for medication refills 3-7 days in advance of running out of medications Call provider office for new concerns or questions  Notify RN Care Manager of [place] call rescheduling needs Participate in Transition of Care Program/Attend [facility] scheduled calls Perform all self care activities independently  Take medications as prescribed    Plan:  Telephone follow up appointment with care management team member scheduled for:  Thursday October 9th at 1:00pm        Patient verbalizes understanding of instructions and care plan provided today and agrees to view in MyChart. Active MyChart status and patient understanding of how to access instructions and care plan via MyChart confirmed with patient.     The patient has been provided with contact information for the care management team and has been advised to call with any health related questions or concerns.   Please call the care guide team at [phone] if you need to cancel or reschedule your appointment.   Please call the Suicide and Crisis Lifeline: 988 call the [place]  National Suicide Prevention Lifeline: 318-582-5864 or TTY: 984-645-8291 TTY 613-009-3881) to talk to a trained counselor if you are experiencing a Mental Health or Behavioral Health Crisis or need someone  to talk to.  Medford Balboa, BSN, RN [place]  VBCI - [employer] [facility] RN Care Manager [phone]

## 2024-05-28 ENCOUNTER — Inpatient Hospital Stay (HOSPITAL_BASED_OUTPATIENT_CLINIC_OR_DEPARTMENT_OTHER)
Admission: EM | Admit: 2024-05-28 | Discharge: 2024-06-02 | DRG: 277 | Disposition: A | Discharge status: Home Health | Source: Ambulatory Visit | Attending: Cardiology | Admitting: Cardiology

## 2024-05-28 ENCOUNTER — Telehealth: Payer: Self-pay | Admitting: Physician Assistant

## 2024-05-28 ENCOUNTER — Emergency Department (HOSPITAL_BASED_OUTPATIENT_CLINIC_OR_DEPARTMENT_OTHER)

## 2024-05-28 ENCOUNTER — Other Ambulatory Visit: Payer: Self-pay

## 2024-05-28 ENCOUNTER — Encounter (HOSPITAL_BASED_OUTPATIENT_CLINIC_OR_DEPARTMENT_OTHER): Payer: Self-pay | Admitting: Emergency Medicine

## 2024-05-28 DIAGNOSIS — E875 Hyperkalemia: Secondary | ICD-10-CM | POA: Diagnosis present

## 2024-05-28 DIAGNOSIS — Z7983 Long term (current) use of bisphosphonates: Secondary | ICD-10-CM

## 2024-05-28 DIAGNOSIS — Z7982 Long term (current) use of aspirin: Secondary | ICD-10-CM

## 2024-05-28 DIAGNOSIS — J45909 Unspecified asthma, uncomplicated: Secondary | ICD-10-CM | POA: Diagnosis present

## 2024-05-28 DIAGNOSIS — I110 Hypertensive heart disease with heart failure: Secondary | ICD-10-CM | POA: Diagnosis not present

## 2024-05-28 DIAGNOSIS — I428 Other cardiomyopathies: Secondary | ICD-10-CM | POA: Diagnosis not present

## 2024-05-28 DIAGNOSIS — M797 Fibromyalgia: Secondary | ICD-10-CM | POA: Diagnosis present

## 2024-05-28 DIAGNOSIS — F32A Depression, unspecified: Secondary | ICD-10-CM | POA: Diagnosis present

## 2024-05-28 DIAGNOSIS — I5030 Unspecified diastolic (congestive) heart failure: Secondary | ICD-10-CM | POA: Diagnosis not present

## 2024-05-28 DIAGNOSIS — Z886 Allergy status to analgesic agent status: Secondary | ICD-10-CM

## 2024-05-28 DIAGNOSIS — Z4502 Encounter for adjustment and management of automatic implantable cardiac defibrillator: Secondary | ICD-10-CM

## 2024-05-28 DIAGNOSIS — K219 Gastro-esophageal reflux disease without esophagitis: Secondary | ICD-10-CM | POA: Diagnosis present

## 2024-05-28 DIAGNOSIS — Z808 Family history of malignant neoplasm of other organs or systems: Secondary | ICD-10-CM

## 2024-05-28 DIAGNOSIS — I472 Ventricular tachycardia, unspecified: Secondary | ICD-10-CM | POA: Diagnosis not present

## 2024-05-28 DIAGNOSIS — G4733 Obstructive sleep apnea (adult) (pediatric): Secondary | ICD-10-CM | POA: Diagnosis not present

## 2024-05-28 DIAGNOSIS — Z885 Allergy status to narcotic agent status: Secondary | ICD-10-CM | POA: Diagnosis not present

## 2024-05-28 DIAGNOSIS — Z882 Allergy status to sulfonamides status: Secondary | ICD-10-CM

## 2024-05-28 DIAGNOSIS — R5383 Other fatigue: Secondary | ICD-10-CM | POA: Diagnosis not present

## 2024-05-28 DIAGNOSIS — Z7984 Long term (current) use of oral hypoglycemic drugs: Secondary | ICD-10-CM

## 2024-05-28 DIAGNOSIS — Z950 Presence of cardiac pacemaker: Secondary | ICD-10-CM | POA: Diagnosis not present

## 2024-05-28 DIAGNOSIS — Z96611 Presence of right artificial shoulder joint: Secondary | ICD-10-CM | POA: Diagnosis present

## 2024-05-28 DIAGNOSIS — Z820 Family history of epilepsy and other diseases of the nervous system: Secondary | ICD-10-CM

## 2024-05-28 DIAGNOSIS — Z87442 Personal history of urinary calculi: Secondary | ICD-10-CM

## 2024-05-28 DIAGNOSIS — Z1152 Encounter for screening for COVID-19: Secondary | ICD-10-CM

## 2024-05-28 DIAGNOSIS — E1151 Type 2 diabetes mellitus with diabetic peripheral angiopathy without gangrene: Secondary | ICD-10-CM | POA: Diagnosis present

## 2024-05-28 DIAGNOSIS — Z87892 Personal history of anaphylaxis: Secondary | ICD-10-CM

## 2024-05-28 DIAGNOSIS — Z91048 Other nonmedicinal substance allergy status: Secondary | ICD-10-CM

## 2024-05-28 DIAGNOSIS — Z853 Personal history of malignant neoplasm of breast: Secondary | ICD-10-CM

## 2024-05-28 DIAGNOSIS — I493 Ventricular premature depolarization: Secondary | ICD-10-CM | POA: Diagnosis present

## 2024-05-28 DIAGNOSIS — I5022 Chronic systolic (congestive) heart failure: Secondary | ICD-10-CM | POA: Diagnosis not present

## 2024-05-28 DIAGNOSIS — Z8249 Family history of ischemic heart disease and other diseases of the circulatory system: Secondary | ICD-10-CM | POA: Diagnosis not present

## 2024-05-28 DIAGNOSIS — Z79890 Hormone replacement therapy (postmenopausal): Secondary | ICD-10-CM

## 2024-05-28 DIAGNOSIS — Z880 Allergy status to penicillin: Secondary | ICD-10-CM | POA: Diagnosis not present

## 2024-05-28 DIAGNOSIS — K7460 Unspecified cirrhosis of liver: Secondary | ICD-10-CM | POA: Diagnosis not present

## 2024-05-28 DIAGNOSIS — J811 Chronic pulmonary edema: Secondary | ICD-10-CM | POA: Diagnosis not present

## 2024-05-28 DIAGNOSIS — Z91011 Allergy to milk products, unspecified: Secondary | ICD-10-CM

## 2024-05-28 DIAGNOSIS — K5900 Constipation, unspecified: Secondary | ICD-10-CM | POA: Diagnosis present

## 2024-05-28 DIAGNOSIS — E785 Hyperlipidemia, unspecified: Secondary | ICD-10-CM | POA: Diagnosis present

## 2024-05-28 DIAGNOSIS — Z79899 Other long term (current) drug therapy: Secondary | ICD-10-CM

## 2024-05-28 DIAGNOSIS — R0602 Shortness of breath: Secondary | ICD-10-CM | POA: Diagnosis not present

## 2024-05-28 DIAGNOSIS — E039 Hypothyroidism, unspecified: Secondary | ICD-10-CM | POA: Diagnosis present

## 2024-05-28 DIAGNOSIS — I517 Cardiomegaly: Secondary | ICD-10-CM | POA: Diagnosis not present

## 2024-05-28 DIAGNOSIS — Z888 Allergy status to other drugs, medicaments and biological substances status: Secondary | ICD-10-CM

## 2024-05-28 DIAGNOSIS — R55 Syncope and collapse: Secondary | ICD-10-CM | POA: Diagnosis not present

## 2024-05-28 DIAGNOSIS — Z803 Family history of malignant neoplasm of breast: Secondary | ICD-10-CM

## 2024-05-28 DIAGNOSIS — Z9011 Acquired absence of right breast and nipple: Secondary | ICD-10-CM

## 2024-05-28 DIAGNOSIS — Z87891 Personal history of nicotine dependence: Secondary | ICD-10-CM

## 2024-05-28 LAB — CBC
HCT: 40.3 % (ref 36.0–46.0)
Hemoglobin: 13.3 g/dL (ref 12.0–15.0)
MCH: 32.5 pg (ref 26.0–34.0)
MCHC: 33 g/dL (ref 30.0–36.0)
MCV: 98.5 fL (ref 80.0–100.0)
Platelets: 313 K/uL (ref 150–400)
RBC: 4.09 MIL/uL (ref 3.87–5.11)
RDW: 13.2 % (ref 11.5–15.5)
WBC: 7.7 K/uL (ref 4.0–10.5)
nRBC: 0 % (ref 0.0–0.2)

## 2024-05-28 LAB — BASIC METABOLIC PANEL WITH GFR
Anion gap: 11 (ref 5–15)
BUN: 19 mg/dL (ref 8–23)
CO2: 24 mmol/L (ref 22–32)
Calcium: 10.7 mg/dL — ABNORMAL HIGH (ref 8.9–10.3)
Chloride: 101 mmol/L (ref 98–111)
Creatinine, Ser: 0.86 mg/dL (ref 0.44–1.00)
GFR, Estimated: >60 mL/min (ref >60)
Glucose, Bld: 139 mg/dL — ABNORMAL HIGH (ref 70–99)
Potassium: 4 mmol/L (ref 3.5–5.1)
Sodium: 137 mmol/L (ref 135–145)

## 2024-05-28 LAB — MAGNESIUM: Magnesium: 2.2 mg/dL (ref 1.7–2.4)

## 2024-05-28 LAB — RESP PANEL BY RT-PCR (RSV, FLU A&B, COVID)  RVPGX2
Influenza A by PCR: NEGATIVE
Influenza B by PCR: NEGATIVE
Resp Syncytial Virus by PCR: NEGATIVE
SARS Coronavirus 2 by RT PCR: NEGATIVE

## 2024-05-28 LAB — TROPONIN T, HIGH SENSITIVITY
Troponin T High Sensitivity: 21 ng/L — ABNORMAL HIGH (ref 0–19)
Troponin T High Sensitivity: 20 ng/L — ABNORMAL HIGH (ref 0–19)

## 2024-05-28 LAB — GLUCOSE, CAPILLARY: Glucose-Capillary: 93 mg/dL (ref 70–99)

## 2024-05-28 MED ORDER — AMIODARONE HCL IN DEXTROSE 360-4.14 MG/200ML-% IV SOLN
30.0000 mg/h | INTRAVENOUS | Status: AC
  Administered 2024-05-28 – 2024-05-29 (×3): 30 mg/h via INTRAVENOUS
  Filled 2024-05-28 (×4): qty 200

## 2024-05-28 MED ORDER — TIOTROPIUM BROMIDE MONOHYDRATE 1.25 MCG/ACT IN AERS: 1.0000 | INHALATION_SPRAY | Freq: Every day | RESPIRATORY_TRACT | Status: DC

## 2024-05-28 MED ORDER — AMIODARONE HCL IN DEXTROSE 360-4.14 MG/200ML-% IV SOLN
60.0000 mg/h | INTRAVENOUS | Status: AC
  Administered 2024-05-28: 60 mg/h via INTRAVENOUS
  Filled 2024-05-28: qty 200

## 2024-05-28 MED ORDER — INSULIN ASPART 100 UNIT/ML IJ SOLN
0.0000 [IU] | Freq: Three times a day (TID) | INTRAMUSCULAR | Status: DC
  Administered 2024-05-29 – 2024-05-30 (×2): 2 [IU] via SUBCUTANEOUS

## 2024-05-28 MED ORDER — METFORMIN HCL ER 500 MG PO TB24
500.0000 mg | ORAL_TABLET | Freq: Two times a day (BID) | ORAL | Status: DC
Start: 2024-05-28 — End: 2024-06-02
  Administered 2024-05-28 – 2024-06-02 (×9): 500 mg via ORAL
  Filled 2024-05-28 (×11): qty 1

## 2024-05-28 MED ORDER — FLUTICASONE FUROATE-VILANTEROL 200-25 MCG/ACT IN AEPB
1.0000 | INHALATION_SPRAY | Freq: Every day | RESPIRATORY_TRACT | Status: DC
  Administered 2024-05-29 – 2024-06-02 (×5): 1 via RESPIRATORY_TRACT
  Filled 2024-05-28: qty 28

## 2024-05-28 MED ORDER — PROSIGHT PO TABS
1.0000 | ORAL_TABLET | Freq: Every day | ORAL | Status: DC
  Administered 2024-05-29 – 2024-06-02 (×5): 1 via ORAL
  Filled 2024-05-28 (×5): qty 1

## 2024-05-28 MED ORDER — OCUVITE EYE + MULTI PO TABS
1.0000 | ORAL_TABLET | Freq: Every morning | ORAL | Status: DC
Start: 2024-05-29 — End: 2024-05-28

## 2024-05-28 MED ORDER — ONDANSETRON HCL 4 MG/2ML IJ SOLN: 4.0000 mg | Freq: Four times a day (QID) | INTRAMUSCULAR | Status: DC | PRN

## 2024-05-28 MED ORDER — MIRABEGRON ER 25 MG PO TB24
25.0000 mg | ORAL_TABLET | Freq: Every day | ORAL | Status: DC
  Administered 2024-05-28 – 2024-06-02 (×6): 25 mg via ORAL
  Filled 2024-05-28 (×6): qty 1

## 2024-05-28 MED ORDER — ALBUTEROL SULFATE (2.5 MG/3ML) 0.083% IN NEBU
2.5000 mg | INHALATION_SOLUTION | Freq: Four times a day (QID) | RESPIRATORY_TRACT | Status: DC | PRN
  Administered 2024-05-31: 2.5 mg via RESPIRATORY_TRACT
  Filled 2024-05-28: qty 3

## 2024-05-28 MED ORDER — INSULIN ASPART 100 UNIT/ML IJ SOLN: 0.0000 [IU] | Freq: Every day | INTRAMUSCULAR | Status: DC

## 2024-05-28 MED ORDER — TORSEMIDE 20 MG PO TABS
20.0000 mg | ORAL_TABLET | Freq: Every day | ORAL | Status: DC
  Administered 2024-05-29 – 2024-06-02 (×5): 20 mg via ORAL
  Filled 2024-05-28 (×5): qty 1

## 2024-05-28 MED ORDER — AMIODARONE IV BOLUS ONLY 150 MG/100ML: 150.0000 mg | Freq: Once | INTRAVENOUS | Status: DC

## 2024-05-28 MED ORDER — PANTOPRAZOLE SODIUM 40 MG PO TBEC
40.0000 mg | DELAYED_RELEASE_TABLET | Freq: Two times a day (BID) | ORAL | Status: DC
  Administered 2024-05-28 – 2024-06-02 (×10): 40 mg via ORAL
  Filled 2024-05-28 (×10): qty 1

## 2024-05-28 MED ORDER — ACETAMINOPHEN 325 MG PO TABS
650.0000 mg | ORAL_TABLET | ORAL | Status: DC | PRN
  Administered 2024-05-29: 650 mg via ORAL
  Filled 2024-05-28: qty 2

## 2024-05-28 MED ORDER — LORATADINE 10 MG PO TABS
10.0000 mg | ORAL_TABLET | Freq: Every day | ORAL | Status: DC
  Administered 2024-05-29 – 2024-06-02 (×5): 10 mg via ORAL
  Filled 2024-05-28 (×5): qty 1

## 2024-05-28 MED ORDER — LUBIPROSTONE 24 MCG PO CAPS
24.0000 ug | ORAL_CAPSULE | ORAL | Status: DC
Start: 2024-05-30 — End: 2024-06-02
  Administered 2024-05-30 – 2024-06-02 (×2): 24 ug via ORAL
  Filled 2024-05-28 (×2): qty 1

## 2024-05-28 MED ORDER — UMECLIDINIUM BROMIDE 62.5 MCG/ACT IN AEPB
1.0000 | INHALATION_SPRAY | Freq: Every day | RESPIRATORY_TRACT | Status: DC
  Administered 2024-05-29 – 2024-06-02 (×5): 1 via RESPIRATORY_TRACT
  Filled 2024-05-28: qty 7

## 2024-05-28 MED ORDER — ISOSORBIDE MONONITRATE ER 30 MG PO TB24
30.0000 mg | ORAL_TABLET | Freq: Every day | ORAL | Status: DC
  Administered 2024-05-28 – 2024-06-02 (×6): 30 mg via ORAL
  Filled 2024-05-28 (×6): qty 1

## 2024-05-28 MED ORDER — MEXILETINE HCL 150 MG PO CAPS
150.0000 mg | ORAL_CAPSULE | Freq: Two times a day (BID) | ORAL | Status: DC
  Administered 2024-05-28: 150 mg via ORAL
  Filled 2024-05-28 (×2): qty 1

## 2024-05-28 MED ORDER — LEVOTHYROXINE SODIUM 112 MCG PO TABS: 112.0000 ug | ORAL_TABLET | Freq: Every day | ORAL | Status: DC

## 2024-05-28 MED ORDER — AMIODARONE LOAD VIA INFUSION
150.0000 mg | Freq: Once | INTRAVENOUS | Status: AC
  Administered 2024-05-28: 150 mg via INTRAVENOUS
  Filled 2024-05-28: qty 83.34

## 2024-05-28 MED ORDER — PRAVASTATIN SODIUM 40 MG PO TABS
40.0000 mg | ORAL_TABLET | Freq: Every day | ORAL | Status: DC
  Administered 2024-05-29 – 2024-06-02 (×5): 40 mg via ORAL
  Filled 2024-05-28 (×5): qty 1

## 2024-05-28 MED ORDER — METOPROLOL SUCCINATE ER 50 MG PO TB24
50.0000 mg | ORAL_TABLET | Freq: Every day | ORAL | Status: DC
  Administered 2024-05-28 – 2024-06-02 (×6): 50 mg via ORAL
  Filled 2024-05-28: qty 2
  Filled 2024-05-28 (×5): qty 1

## 2024-05-28 MED ORDER — LEVOTHYROXINE SODIUM 112 MCG PO TABS
112.0000 ug | ORAL_TABLET | Freq: Every day | ORAL | Status: DC
  Administered 2024-05-29 – 2024-06-02 (×4): 112 ug via ORAL
  Filled 2024-05-28 (×4): qty 1

## 2024-05-28 MED ORDER — ZOLPIDEM TARTRATE 5 MG PO TABS: 5.0000 mg | ORAL_TABLET | Freq: Every evening | ORAL | Status: DC | PRN

## 2024-05-28 MED ORDER — LORAZEPAM 0.5 MG PO TABS
0.5000 mg | ORAL_TABLET | Freq: Once | ORAL | Status: DC | PRN
  Filled 2024-05-28: qty 1

## 2024-05-28 NOTE — Progress Notes (Signed)
   05/28/24 2057  BiPAP/CPAP/SIPAP  Reason BIPAP/CPAP not in use (S)  Other(comment) (device currently not available in dept, will setup for pt as soon as device becomes available)

## 2024-05-28 NOTE — Progress Notes (Signed)
 Pt refusing bed alarm at this time. Pt is steady with [place] however she has an IV pole, telemetry, and pulse Ox connected making ambulating independently difficult.  I explained reasoning for setting bed alarm is for her safety so staff can assist her with equipment. Pt adamantly refusing at this time.

## 2024-05-28 NOTE — ED Triage Notes (Signed)
 Pt reports , dizziness (intermittent), difficulty breathing since 0200; describes as like someone put their foot in my chest; denies NV

## 2024-05-28 NOTE — Consult Note (Signed)
 Electrophysiology consultation   Patient ID: Tanya Hogan MRN: [id number]; DOB: [birth year]  Admit date: 05/28/2024 Date of Consult: 05/28/2024  PCP:  [name], MD   History of Present Illness:   Tanya Hogan is a 73 year old woman who I am seeing today for an evaluation of ventricular tachycardia at the request of Dr. Cottie.  The patient is known to our service.  She has a history of chronic systolic heart failure, ventricular tachycardia with a CRT-P in situ.  She was recently admitted for a similar situation and required IV amiodarone .  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  She was not felt to be a good candidate for lead extraction and replacement.  Her base pacing rate was increased to try to suppress the ventricular arrhythmias.  She also takes mexiletine.  She called the on-call provider today reporting recurrent ventricular arrhythmias at home.  Remote interrogation confirmed sustained ventricular tachycardia.  She was referred to the emergency department.  She ended up going to [facility].  She was hemodynamically stable there.  She was started on IV amiodarone  and her mexiletine was continued.  No recent illnesses or changes to medications.  Today she confirms the above.  Reports she was feeling fine up until about 2 days ago.  She then started to feel terrible.  Unclear what changed.  She reports medication regimen adherence.  Does not think that she is significantly volume overloaded.  Reports no syncopal episode.   Past medical, surgical, social and family history reviewed.  ROS:  Please see the history of present illness.  All other ROS reviewed and negative.     Physical Exam/Data:   Vitals:   05/28/24 1230 05/28/24 1245 05/28/24 1345 05/28/24 1400  BP: 110/65 (!) 116/98    Pulse: 90 90 88 86  Resp: (!) 25 16 (!) 25 16  Temp:      SpO2: 93% 95% 94%    Weight:      Height:        General:  Well nourished, well developed, in no acute distress.  Elderly Cardiac:  normal S1, S2; RRR; no murmur.  ICD pocket well-healed Lungs:  clear to auscultation bilaterally, no wheezing, rhonchi or rales  Psych:  Normal affect   EKG:  The EKG was personally reviewed and demonstrates: AV sequential pacing.  Biventricular pacing.  Telemetry:  Telemetry was personally reviewed and demonstrates: Paced  Device interrogation from home reviewed.  Sustained monomorphic ventricular tachycardia. Battery longevity 5.3 years 3 leads are secured to the generator.  1-lead is abandoned. Lead parameters are stable Atrial pacing 98.5% Ventricular pacing 98.2% 34 VT episodes OptiVol below threshold VT episodes occurring October 3, fourth, fifth.  Longest episode 12 minutes 25 seconds with a average ventricular rate of 132 bpm, max ventricular rate of 154 bpm EGM of longest episode confirms VA dissociation and V rate greater than a rate     Assessment and Plan:   #Ventricular tachycardia Nonischemic.  Hemodynamically well-tolerated but she is symptomatic when it happens.  It has been refractory to amiodarone  and mexiletine. Limited options.  This was discussed with the patient today.  She would likely benefit from an implantable defibrillator to allow for ATP therapy.  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  We will need to revisit these conversations with her primary electrophysiologist.  Will continue IV amiodarone  overnight.  Consider transition to quinidine if continues to have breakthrough ventricular arrhythmias.  #Chronic systolic heart failure #Nonischemic cardiomyopathy Continue home torsemide , metoprolol, Imdur      Ole T. Cindie, MD, [facility], [facility] Cardiac Electrophysiology

## 2024-05-28 NOTE — ED Notes (Signed)
 [facility] called for transfer [facility] ED

## 2024-05-28 NOTE — ED Notes (Signed)
 Pt took at home medication; mexilitine 150 mg, per EDP verbal order. Pt instructed not to take any other at home medications per EDP verbal order

## 2024-05-28 NOTE — Telephone Encounter (Signed)
 Patient was hospitalized for VT and was discharged on 05/24/2024.  Yesterday and today she has had some episodes of palpitations.  They do not feel quite like what she had before, but she still is concerned.    She does not want to come to the hospital, wonders if she can stay home.  With the help of Dr. Cindie, I was able to contact the rep and get a home device interrogation.  She is shown to be having episodes of VT.  Dr. Cindie recommends that she come to the hospital.  I called the patient back and she says she will find a way to get there as the EMS will not bring her directly to [facility] but she may be able to get to [facility].  She will call her son and get him involved in this as well.  Recommend EMS transport  Shona Shad, PA-C 05/28/2024 10:00 AM

## 2024-05-28 NOTE — ED Provider Notes (Signed)
 [facility] Provider Note   CSN: [id number] Arrival date & time: 05/28/24  1113     Patient presents with: Chest Pain   Tanya Hogan is a 73 y.o. female with a history of ventricular tachycardia, heart failure, recent hospitalization for ventricular tachycardia as noted from discharge summary below, presented to ED with chest pain.  Patient reports that she woke up with a sensation this morning, like something was stabbing her chest, was also having swimmy headedness like her head was tilted sideways, and feeling weak and short of breath.  She called the cardiology office and was told to come into the ER.  Per my review of external records, there is a cardiology note from today, noting they interrogated her pacemaker device for concern that she was having episodes of V. tach, and that she should proceed to the hospital.  The patient did not take any of her morning medications today except for her Synthroid .  She typically takes amiodarone  and Toprol and mexiletine for heart rate control, also on Imdur   05/24/24 cardiology discharge note summarizes recent stay:  The patient was admitted started on amiodarone  gtt.  She was recently on antibiotic for a colitis, thoughts perhaps this may have prolonged QT > provoked, though was MMVT and likely not the cause.  RV pacing rate was increased to 70bpm and then 80bpm, to try and supress frequent NSVTs Labs were unrevealing. AHF and EP teams engaged, started on mexiletine. Several discussions regarding options for management TTE w/LVEF 20-25%, global HK, no significant VHD R/LHC with no obstructive CAD L axillary venography noted proximal to the insertion site of multiple leads is patent however a wire and catheter could not cross the this confluence.  Ultimately after much discussion with EP team/partners as well as [facility] EP/extraction MD,  She has known hx of VT, with prior discussion of PPM upgrade to  CRT though not felt to be a good candiate for the procedure, with 4 leads in currently, one abandoned, would require extraction. Discussed that up to this point her VT has been hemodynamically tolerated so exposing her to the risk of going back to her previous pocket may just result in additional shocks at home. The benefit of upgrading her device would be primarily for ATP so I do not think addition of something like an SICD would be that helpful in her situation. If no recurrent VT/VF plan to continue amiodarone  400 mg twice daily load followed by 400 mg daily with mexiletine 150 mg twice daily.    05/23/24 she had a rise in V ectopy nonsustained V rhythm as well as sustained (rates 80's), and well tolerated  Her coreg  changed to Toprol, her pacing rate further increased to 90bpm with marked improvement.  No further slow VTs, intermittently has PVCs in a trigeminal pattern.   The patient feels well, denies any CP/SOB, she is ambulating in the room independently without difficulty w/Byus.  She was examined by Dr. Almetta and considered stable for discharge to home.    Mild hyperkalemia > prompted stopping spironolactone  Home with  Amiodarone  increased from 200mg  daily home dose > 200mg  BID  until seen in the clinic ~ 2 weeks Off coreg  > Toprol 50mg  BID Mexiletine 150mg  BID   HPI     Prior to Admission medications   Medication Sig Start Date End Date Taking? Authorizing Provider  acetaminophen  (TYLENOL ) 325 MG tablet Take 325 mg by mouth every 6 (six) hours as needed  for mild pain (pain score 1-3).    [provider]  albuterol  (PROVENTIL ) (2.5 MG/3ML) 0.083% nebulizer solution Take 2.5 mg by nebulization every 6 (six) hours as needed for wheezing or shortness of breath.    [provider]  alendronate  (FOSAMAX ) 70 MG tablet TAKE 1 TABLET BY MOUTH EVERY 7  DAYS WITH FULL GLASS OF WATER  ON AN EMPTY STOMACH 05/26/24   [name], MD  amiodarone  (PACERONE ) 200 MG  tablet Take 1 tablet (200 mg total) by mouth daily. 11/10/23   Pietro Redell RAMAN, MD  amiodarone  (PACERONE ) 200 MG tablet Take 1 tablet (200 mg total) by mouth 2 (two) times daily for 14 days. 05/24/24 06/07/24  Leverne Charlies Helling, PA-C  aspirin  81 MG chewable tablet Chew 81 mg by mouth daily.    [provider]  Calcium  Carbonate-Vit D-Min (CALCIUM  600+D PLUS MINERALS) 600-400 MG-UNIT TABS 1 tab p.o. twice daily Patient not taking: Reported on 05/13/2024 11/02/23   Curtis Debby PARAS, MD  Continuous Glucose Receiver (FREESTYLE LIBRE 3 READER) DEVI Use for continuous glucose monitoring 04/10/24   [name], MD  Continuous Glucose Sensor (FREESTYLE LIBRE 3 SENSOR) MISC by Does not apply route. Change every 14 days    [provider]  EPINEPHrine  0.3 mg/0.3 mL IJ SOAJ injection Inject 0.3 mg into the muscle as needed for anaphylaxis. 08/27/23   [name], MD  estradiol (ESTRACE) 0.1 MG/GM vaginal cream Place 1 Applicatorful vaginally 3 (three) times a week. Patient taking differently: Place 1 Applicatorful vaginally once a week.    [provider]  fexofenadine  (ALLEGRA ) 180 MG tablet Take 180 mg by mouth daily.    [provider]  fluticasone -salmeterol (WIXELA INHUB ) 250-50 MCG/ACT AEPB Inhale 1 puff into the lungs in the morning and at bedtime.    [provider]  GEMTESA 75 MG TABS Take 75 mg by mouth daily. 01/11/24   [provider]  glucose blood test strip To be used to check blood glucose Dx:E11.8 FreeStyle Precision blood glucose for Freestyle Libre 2 05/02/24   [name], MD  isosorbide  mononitrate (IMDUR ) 30 MG 24 hr tablet TAKE 1 TABLET BY MOUTH DAILY 05/26/24   Pietro Redell RAMAN, MD  levothyroxine  (SYNTHROID ) 112 MCG tablet TAKE 1 TABLET BY MOUTH DAILY 05/04/24   [name], [name] D, MD  lubiprostone (AMITIZA) 24 MCG capsule Take 24 mcg by mouth 2 (two) times daily with a meal. Patient reports provider has  decreased to every three days. Patient taking differently: Take 24 mcg by mouth every 3 (three) days. Patient reports provider has decreased to every three days.    [provider]  metFORMIN  (GLUCOPHAGE -XR) 500 MG 24 hr tablet TAKE 1 TABLET BY MOUTH TWICE  DAILY WITH MEALS 04/19/24   [name], [name] D, MD  metoprolol succinate (TOPROL-XL) 50 MG 24 hr tablet Take 1 tablet (50 mg total) by mouth 2 (two) times daily. Take with or immediately following a meal. 05/24/24   Leverne Charlies Helling, PA-C  mexiletine (MEXITIL ) 150 MG capsule Take 1 capsule (150 mg total) by mouth every 12 (twelve) hours. 05/24/24   Leverne Charlies Helling, PA-C  Multiple Vitamins-Minerals (MULTIPLE VITAMINS/WOMENS PO) Take 1 tablet by mouth daily. Patient taking differently: Take 1 tablet by mouth daily. Patient takes MVI with iron    [provider]  Multiple Vitamins-Minerals (OCUVITE EYE + MULTI) TABS Take 1 tablet by mouth in the morning.    [provider]  pantoprazole  (PROTONIX )  40 MG tablet TAKE 1 TABLET BY MOUTH DAILY  BEFORE BREAKFAST Patient taking differently: Take 40 mg by mouth 2 (two) times daily. 10/20/22   [name], MD  pravastatin  (PRAVACHOL ) 40 MG tablet Take 40 mg by mouth daily. 01/26/24   [provider]  Tiotropium Bromide  Monohydrate (SPIRIVA  RESPIMAT) 1.25 MCG/ACT AERS Inhale 1 puff into the lungs daily.    [provider]  torsemide  (DEMADEX ) 20 MG tablet TAKE 1 TABLET BY MOUTH DAILY 05/26/24   Pietro Redell RAMAN, MD  valACYclovir  (VALTREX ) 1000 MG tablet Take 1 tablet (1,000 mg total) by mouth 2 (two) times daily. Patient not taking: Reported on 05/13/2024 05/17/23   Alvia Bring, DO    Allergies: Injectafer [ferric carboxymaltose], Penicillins, Accupril [quinapril hcl], Cozaar  [losartan  potassium], Dml forte, Entresto [sacubitril-valsartan], Flonase  [fluticasone ], Lactose intolerance (gi), Lipitor [atorvastatin ], Nasonex  [mometasone  furoate], Nsaids,  Aspirin , Baking soda-fluoride [sodium fluoride], Chamomile, Clindamycin /lincomycin, Codeine, Furosemide , Lanolin-petrolatum, Miralax  [polyethylene glycol], Quinine, Reclast  [zoledronic  acid], Rifampin, Singulair  [montelukast ], Sulfa antibiotics, Tramadol , Vesicare  [solifenacin ], Wellbutrin  [bupropion ], and Wool alcohol [lanolin]    Review of Systems  Updated Vital Signs BP (!) 116/98   Pulse 90   Temp 97.9 F (36.6 C)   Resp 16   Ht 5' 1 (1.549 m)   Wt 83.5 kg   SpO2 95%   BMI 34.77 kg/m   Physical Exam Constitutional:      General: She is not in acute distress. HENT:     Head: Normocephalic and atraumatic.  Eyes:     Conjunctiva/sclera: Conjunctivae normal.     Pupils: Pupils are equal, round, and reactive to light.  Cardiovascular:     Rate and Rhythm: Normal rate and regular rhythm.  Pulmonary:     Effort: Pulmonary effort is normal. No respiratory distress.  Abdominal:     General: There is no distension.     Tenderness: There is no abdominal tenderness.  Skin:    General: Skin is warm and dry.  Neurological:     General: No focal deficit present.     Mental Status: She is alert. Mental status is at baseline.  Psychiatric:        Mood and Affect: Mood normal.        Behavior: Behavior normal.     (all labs ordered are listed, but only abnormal results are displayed) Labs Reviewed  BASIC METABOLIC PANEL WITH GFR - Abnormal; Notable for the following components:      Result Value   Glucose, Bld 139 (*)    Calcium  10.7 (*)    All other components within normal limits  TROPONIN T, HIGH SENSITIVITY - Abnormal; Notable for the following components:   Troponin T High Sensitivity 21 (*)    All other components within normal limits  RESP PANEL BY RT-PCR (RSV, FLU A&B, COVID)  RVPGX2  CBC  MAGNESIUM   TROPONIN T, HIGH SENSITIVITY    EKG: EKG Interpretation Date/Time:  Sunday May 28 2024 11:23:05 EDT Ventricular Rate:  93 PR Interval:  129 QRS  Duration:  180 QT Interval:  468 QTC Calculation: 583 R Axis:   128  Text Interpretation: A-V dual-paced rhythm with some inhibition No further analysis attempted due to paced rhythm No significant changes since Sept 24 2025 tracing Confirmed by Cottie Cough [phone]) on 05/28/2024 11:25:07 AM  Radiology: DG Chest Portable 1 View Result Date: 05/28/2024 CLINICAL DATA:  fatigue, SOB, near syncope EXAM: PORTABLE CHEST 1 VIEW COMPARISON:  May 12, 2024 FINDINGS: The cardiomediastinal silhouette is unchanged  and enlarged in contour.LEFT chest cardiac pacing device. No pleural effusion. No pneumothorax. Mildly increased reticular prominence diffusely. Mild peribronchial cuffing. IMPRESSION: Constellation of findings are favored to reflect mild pulmonary edema. Electronically Signed   By: Corean Salter M.D.   On: 05/28/2024 11:58     .Critical Care  Performed by: Cottie Donnice PARAS, MD Authorized by: Cottie Donnice PARAS, MD   Critical care provider statement:    Critical care time (minutes):  40   Critical care time was exclusive of:  Separately billable procedures and treating other patients   Critical care was necessary to treat or prevent imminent or life-threatening deterioration of the following conditions:  Circulatory failure   Critical care was time spent personally by me on the following activities:  Ordering and performing treatments and interventions, ordering and review of laboratory studies, ordering and review of radiographic studies, pulse oximetry, review of old charts, examination of patient and evaluation of patient's response to treatment   Care discussed with: admitting provider   Comments:     IV amiodarone  for V Tach    Medications Ordered in the ED  metoprolol succinate (TOPROL-XL) 24 hr tablet 50 mg (50 mg Oral Given 05/28/24 1151)  amiodarone  (NEXTERONE  PREMIX) 360-4.14 MG/200ML-% (1.8 mg/mL) IV infusion (60 mg/hr Intravenous New Bag/Given 05/28/24 1156)     Followed by  amiodarone  (NEXTERONE  PREMIX) 360-4.14 MG/200ML-% (1.8 mg/mL) IV infusion (has no administration in time range)  LORazepam  (ATIVAN ) tablet 0.5 mg (has no administration in time range)  amiodarone  (NEXTERONE ) 1.8 mg/mL load via infusion 150 mg (150 mg Intravenous Bolus from Bag 05/28/24 1156)    Clinical Course as of 05/28/24 1329  Sun May 28, 2024  1141 Dr Cindie EP recommending IV bolus amiodarone  and IV infusion, okay to give oral mexilitine (pt will take her HOME dose which she has a beside, as we don't have this available in our ED) and oral metoprolol.  Will need admission.  Dr Cindie reports that he reviewed her pacemaker interrogation from earlier today and she did in fact have episodes of ventricular tachycardia [MT]  1252 Cardiology team confirms plan to admit to progressive cardiac bed at [facility], orders placed [MT]    Clinical Course User Index [MT] Annagrace Carr, Donnice PARAS, MD                                 Medical Decision Making Amount and/or Complexity of Data Reviewed Labs: ordered. Radiology: ordered.  Risk Prescription drug management. Decision regarding hospitalization.   This patient presents to the ED with concern for palpitations, chest pressure, lightheadedness, fatigue. This involves an extensive number of treatment options, and is a complaint that carries with it a high risk of complications and morbidity.  The differential diagnosis includes arrhythmia versus nonischemic cardiomyopathy versus underlying infection including viral illness and COVID-19 versus anemia versus other  Co-morbidities that complicate the patient evaluation: History of ventricular arrhythmia nonischemic cardiomyopathy, risk of cardiovascular complication   External records from outside source obtained and reviewed including cardiology discharge summary from last week, prior cardiology records  I ordered and personally interpreted labs.  The pertinent results include: No emergent  findings.  Very minor elevated troponin, likely related to earlier V. tach  I ordered imaging studies including x-ray of the chest I independently visualized and interpreted imaging which showed mild pulmonary edema pattern I agree with the radiologist interpretation  The patient was maintained on a  cardiac monitor.  I personally viewed and interpreted the cardiac monitored which showed an underlying rhythm of: Ventricular paced rhythm  Per my interpretation the patient's ECG shows ventricular paced rhythm  I ordered medication including IV amiodarone  bolus and infusion per cardiology consultation, oral metoprolol.  Patient took her home mexiletine 150 mg now  I have reviewed the patients home medicines and have made adjustments as needed  Test Considered: Low suspicion for acute PE, indication for CT angiogram  I requested consultation with the cardiology,  and discussed lab and imaging findings as well as pertinent plan - they recommend: See ED course  After the interventions noted above, I reevaluated the patient and found that they have: stayed the same  Patient remained stable in ventricular paced rhythm with no further episodes of ventricular tachycardia at this time in the ED.  She is admitted to progressive bed.  There were no immediate progressive beds available at [facility], therefore she will be transferred temporarily ED to ED to [facility] to board while waiting on a bed.  She remains a primary admitted patient under cardiology services, confirmed with Dr. Cindie.  Patient was requesting an anxiolytic medicine which we can give in the ED.  Disposition:  After consideration of the diagnostic results and the patients response to treatment, I feel that the patent would benefit from medical admission      Final diagnoses:  Ventricular tachycardia [facility])    ED Discharge Orders     None          Cottie Donnice PARAS, MD 05/28/24 1330

## 2024-05-28 NOTE — ED Notes (Signed)
 Attempted to call report to [facility] CN, no response

## 2024-05-28 NOTE — H&P (Signed)
 Please see consult note dated May 28, 2024.  Ole T. Cindie, MD, [facility], [facility] Cardiac Electrophysiology

## 2024-05-28 NOTE — Plan of Care (Signed)

## 2024-05-29 ENCOUNTER — Encounter: Payer: Self-pay | Admitting: Hematology and Oncology

## 2024-05-29 ENCOUNTER — Other Ambulatory Visit (HOSPITAL_COMMUNITY): Payer: Self-pay

## 2024-05-29 DIAGNOSIS — I472 Ventricular tachycardia, unspecified: Secondary | ICD-10-CM | POA: Diagnosis not present

## 2024-05-29 LAB — GLUCOSE, CAPILLARY
Glucose-Capillary: 107 mg/dL — ABNORMAL HIGH (ref 70–99)
Glucose-Capillary: 101 mg/dL — ABNORMAL HIGH (ref 70–99)
Glucose-Capillary: 142 mg/dL — ABNORMAL HIGH (ref 70–99)
Glucose-Capillary: 104 mg/dL — ABNORMAL HIGH (ref 70–99)

## 2024-05-29 MED ORDER — QUINIDINE SULFATE 300 MG PO TABS
300.0000 mg | ORAL_TABLET | Freq: Three times a day (TID) | ORAL | 0 refills | Status: DC
  Filled 2024-05-29: qty 90, 30d supply, fill #0

## 2024-05-29 MED ORDER — QUINIDINE SULFATE 200 MG PO TABS
200.0000 mg | ORAL_TABLET | Freq: Three times a day (TID) | ORAL | Status: DC
  Administered 2024-05-29 – 2024-06-02 (×11): 200 mg via ORAL
  Filled 2024-05-29 (×13): qty 1

## 2024-05-29 NOTE — Progress Notes (Signed)
  Patient Name: Tanya Hogan Date of Encounter: 05/29/2024  Primary Cardiologist: Redell Shallow, MD Electrophysiologist: Danelle Birmingham, MD  Interval Summary   Doing OK this am.  Some ectopy but no further sustained VT/NSVT on IV amiodarone  since yesterday evening.   Vital Signs    Vitals:   05/28/24 1526 05/28/24 1952 05/29/24 0029 05/29/24 0325  BP: 120/69 (!) 107/57 105/73 99/66  Pulse:  89 89 89  Resp: 16 18 20 19   Temp: 98 F (36.7 C) 97.8 F (36.6 C) (!) 97.5 F (36.4 C) 97.8 F (36.6 C)  TempSrc: Oral Oral Oral Oral  SpO2:  98% 100% 97%  Weight: 83.5 kg     Height: 5' 1 (1.549 m)       Intake/Output Summary (Last 24 hours) at 05/29/2024 0758 Last data filed at 05/29/2024 9372 Gross per 24 hour  Intake 333.3 ml  Output 0 ml  Net 333.3 ml   Filed Weights   05/28/24 1118 05/28/24 1526  Weight: 83.5 kg 83.5 kg    Physical Exam    GEN- NAD, Alert and oriented  Lungs- Clear to ausculation bilaterally, normal work of breathing Cardiac- Regular rate and rhythm, no murmurs, rubs or gallops GI- soft, NT, ND, + BS Extremities- no clubbing or cyanosis. No edema  Telemetry    V paced in 90s with relatively frequent PVCs (personally reviewed)  Hospital Course    Tanya Hogan is a 73 y.o. female with a history of HTN, HLD, DM, LBBB, NICM, OSA w/CPAP, CHB, chronic CHF (systolic), w/CRT-P, PVCs, Abandoned RV lead, and breast Ca (s/p mastectomy) and VT admitted for recurrent VT  Assessment & Plan    VT, Refractory With episodes in 130-150 range up to 12 minutes and highly symptomatic.  Continue IV amiodarone  this am Continue toprol 50 mg daily  Long discussion today.   Options are to add Quinidine and stop mexitil , but will need to see where it is available locally and what her insurance coverage would be.   Other option is to proceed with high risk lead revision, known that she is not a good bail out candidate or that inserting a lead without extracting an  old lead could result in further subclavian stenosis.   Chronic systolic CHF Volume status looks OK.  Continue torsemide  20 mg daily Continue BB as above.       For questions or updates, please contact  [facility] Please consult [email or web address] for contact info under     Signed, Ozell Prentice Passey, PA-C  05/29/2024, 7:58 AM

## 2024-05-29 NOTE — Progress Notes (Signed)
 Mobility Specialist Progress Note;   05/29/24 1046  Mobility  Activity Ambulated with assistance  Level of Assistance Contact guard assist, steadying assist  Assistive Device Front wheel [place]  Distance Ambulated (ft) 100 ft  Activity Response Tolerated well  Mobility Referral Yes  Mobility visit 1 Mobility  Mobility Specialist Start Time (ACUTE ONLY) 1046  Mobility Specialist Stop Time (ACUTE ONLY) 1058  Mobility Specialist Time Calculation (min) (ACUTE ONLY) 12 min   Pt in chair upon arrival, agreeable to mobility. Required light MinG assistance during ambulation for safety. VSS throughout. C/o fatigue limiting distance tolerance. Requested to ambulate again this PM, will f/u as able. Pt returned back to chair and left with all needs met, call bell in reach.   Lauraine Erm Mobility Specialist Please contact via SecureChat or [employer] [phone]

## 2024-05-29 NOTE — Plan of Care (Signed)
   Problem: Education: Goal: Knowledge of General Education information will improve Description: Including pain rating scale, medication(s)/side effects and non-pharmacologic comfort measures Outcome: Progressing   Problem: Health Behavior/Discharge Planning: Goal: Ability to manage health-related needs will improve Outcome: Progressing   Problem: Clinical Measurements: Goal: Ability to maintain clinical measurements within normal limits will improve Outcome: Progressing   Problem: Activity: Goal: Risk for activity intolerance will decrease Outcome: Progressing   Problem: Nutrition: Goal: Adequate nutrition will be maintained Outcome: Progressing   Problem: Coping: Goal: Level of anxiety will decrease Outcome: Progressing   Problem: Pain Managment: Goal: General experience of comfort will improve and/or be controlled Outcome: Progressing

## 2024-05-30 DIAGNOSIS — I472 Ventricular tachycardia, unspecified: Secondary | ICD-10-CM | POA: Diagnosis not present

## 2024-05-30 DIAGNOSIS — I5030 Unspecified diastolic (congestive) heart failure: Secondary | ICD-10-CM

## 2024-05-30 LAB — GLUCOSE, CAPILLARY
Glucose-Capillary: 112 mg/dL — ABNORMAL HIGH (ref 70–99)
Glucose-Capillary: 106 mg/dL — ABNORMAL HIGH (ref 70–99)
Glucose-Capillary: 134 mg/dL — ABNORMAL HIGH (ref 70–99)
Glucose-Capillary: 85 mg/dL (ref 70–99)

## 2024-05-30 MED ORDER — BISACODYL 5 MG PO TBEC: 5.0000 mg | DELAYED_RELEASE_TABLET | Freq: Two times a day (BID) | ORAL | Status: DC

## 2024-05-30 MED ORDER — BISACODYL 5 MG PO TBEC
5.0000 mg | DELAYED_RELEASE_TABLET | Freq: Two times a day (BID) | ORAL | Status: DC
  Administered 2024-05-30: 5 mg via ORAL
  Filled 2024-05-30: qty 1

## 2024-05-30 MED ORDER — BISACODYL 5 MG PO TBEC
5.0000 mg | DELAYED_RELEASE_TABLET | Freq: Two times a day (BID) | ORAL | Status: DC
  Administered 2024-05-30 – 2024-06-01 (×5): 5 mg via ORAL
  Filled 2024-05-30 (×6): qty 1

## 2024-05-30 MED ORDER — AMIODARONE HCL 200 MG PO TABS
200.0000 mg | ORAL_TABLET | Freq: Two times a day (BID) | ORAL | Status: DC
  Administered 2024-05-30 – 2024-06-02 (×7): 200 mg via ORAL
  Filled 2024-05-30 (×7): qty 1

## 2024-05-30 MED ORDER — SODIUM CHLORIDE 0.9 % IV SOLN: INTRAVENOUS | Status: DC

## 2024-05-30 MED ORDER — BISACODYL 5 MG PO TBEC
5.0000 mg | DELAYED_RELEASE_TABLET | Freq: Once | ORAL | Status: DC
  Filled 2024-05-30: qty 1

## 2024-05-30 NOTE — Plan of Care (Signed)

## 2024-05-30 NOTE — Progress Notes (Signed)
  Patient Name: Tanya Hogan Date of Encounter: 05/30/2024  Primary Cardiologist: Redell Shallow, MD Electrophysiologist: Danelle Birmingham, MD  Interval Summary   No new complaints this am. Brief palpitations but improved. Willing to proceed with ICD upgrade attempt (non-extraction)  Vital Signs    Vitals:   05/29/24 1942 05/29/24 2335 05/30/24 0044 05/30/24 0533  BP: 101/67 106/66  115/74  Pulse: 89 90 75 89  Resp:  18 16 18   Temp: 97.8 F (36.6 C) (!) 97.4 F (36.3 C)  (!) 97.4 F (36.3 C)  TempSrc: Oral Oral  Oral  SpO2: 97% 96% 98% 97%  Weight:      Height:        Intake/Output Summary (Last 24 hours) at 05/30/2024 0703 Last data filed at 05/29/2024 1259 Gross per 24 hour  Intake 360 ml  Output --  Net 360 ml   Filed Weights   05/28/24 1118 05/28/24 1526  Weight: 83.5 kg 83.5 kg    Physical Exam    GEN- NAD, Alert and oriented  Lungs- Clear to ausculation bilaterally, normal work of breathing Cardiac- Regular rate and rhythm, no murmurs, rubs or gallops GI- soft, NT, ND, + BS Extremities- no clubbing or cyanosis. No edema  Telemetry    AV dual paced at 90 (personally reviewed)  Hospital Course    Tanya Hogan is a 73 y.o. female with a history of HTN, HLD, DM, LBBB, NICM, OSA w/CPAP, CHB, chronic CHF (systolic), w/CRT-P, PVCs, Abandoned RV lead, and breast Ca (s/p mastectomy) and VT admitted for recurrent VT   Assessment & Plan    VT, Refractory With episodes in 130-150 range up to 12 minutes and highly symptomatic.  Transition to amiodarone  200 mg BID Continue quinidine 200 mg TID - plan to consolidate to 300 mg BID at discharge  Continue toprol 50 mg daily  Will plan addition lead revision tomorrow. Explained risks, benefits, and alternatives to ICD upgrade implantation, including but not limited to bleeding, infection, pneumothorax, pericardial effusion, lead dislodgement, heart attack, stroke, or death.  Understands increased risk for subclavian  stenosis and iatrogenic tricuspid regurgitation. Pt verbalized understanding and agrees to proceed.      Chronic systolic CHF Volume status looks OK Continue torsemide  20 mg daily Continue BB as above.     For questions or updates, please contact [facility] Please consult [email or web address] for contact info under     Signed, Ozell Prentice Passey, PA-C  05/30/2024, 7:03 AM

## 2024-05-30 NOTE — Progress Notes (Signed)
 Mobility Specialist Progress Note;   05/30/24 1038  Mobility  Activity Ambulated with assistance  Level of Assistance Standby assist, set-up cues, supervision of patient - no hands on  Assistive Device Front wheel [place]  Distance Ambulated (ft) 150 ft  Activity Response Tolerated well  Mobility Referral Yes  Mobility visit 1 Mobility  Mobility Specialist Start Time (ACUTE ONLY) 1038  Mobility Specialist Stop Time (ACUTE ONLY) 1046  Mobility Specialist Time Calculation (min) (ACUTE ONLY) 8 min   Pt agreeable to mobility. Required no physical assistance during ambulation, SV for safety. VSS throughout. Pt returned back to chair and left with all needs met, call bell in reach.   Lauraine Erm Mobility Specialist Please contact via SecureChat or [employer] [phone]

## 2024-05-31 ENCOUNTER — Inpatient Hospital Stay: Admitting: Family Medicine

## 2024-05-31 ENCOUNTER — Other Ambulatory Visit (HOSPITAL_COMMUNITY): Payer: Self-pay

## 2024-05-31 ENCOUNTER — Encounter (HOSPITAL_COMMUNITY)
Admission: EM | Disposition: A | Discharge status: Home Health | Payer: Self-pay | Source: Ambulatory Visit | Attending: Cardiology

## 2024-05-31 ENCOUNTER — Other Ambulatory Visit: Payer: Self-pay

## 2024-05-31 HISTORY — PX: BIV ICD GENERATOR CHANGEOUT: EP1194

## 2024-05-31 HISTORY — PX: LEAD INSERTION: EP1212

## 2024-05-31 LAB — GLUCOSE, CAPILLARY
Glucose-Capillary: 109 mg/dL — ABNORMAL HIGH (ref 70–99)
Glucose-Capillary: 99 mg/dL (ref 70–99)
Glucose-Capillary: 137 mg/dL — ABNORMAL HIGH (ref 70–99)

## 2024-05-31 SURGERY — BIV ICD GENERATOR CHANGEOUT

## 2024-05-31 MED ORDER — VANCOMYCIN HCL IN DEXTROSE 1-5 GM/200ML-% IV SOLN
INTRAVENOUS | Status: AC
  Administered 2024-05-31: 1000 mg via INTRAVENOUS
  Filled 2024-05-31: qty 200

## 2024-05-31 MED ORDER — FENTANYL CITRATE (PF) 100 MCG/2ML IJ SOLN
INTRAMUSCULAR | Status: DC | PRN
  Administered 2024-05-31: 50 ug via INTRAVENOUS

## 2024-05-31 MED ORDER — ACETAMINOPHEN 325 MG PO TABS
975.0000 mg | ORAL_TABLET | Freq: Three times a day (TID) | ORAL | Status: DC
  Administered 2024-05-31 – 2024-06-01 (×3): 975 mg via ORAL
  Filled 2024-05-31 (×4): qty 3

## 2024-05-31 MED ORDER — FENTANYL CITRATE (PF) 100 MCG/2ML IJ SOLN
INTRAMUSCULAR | Status: AC
  Filled 2024-05-31: qty 2

## 2024-05-31 MED ORDER — HEPARIN (PORCINE) IN NACL 1000-0.9 UT/500ML-% IV SOLN
INTRAVENOUS | Status: DC | PRN
  Administered 2024-05-31: 500 mL

## 2024-05-31 MED ORDER — QUINIDINE SULFATE 300 MG PO TABS
300.0000 mg | ORAL_TABLET | Freq: Two times a day (BID) | ORAL | 6 refills | Status: DC
  Filled 2024-05-31 (×2): qty 60, 30d supply, fill #0

## 2024-05-31 MED ORDER — SODIUM CHLORIDE 0.9 % IV SOLN
INTRAVENOUS | Status: AC
  Administered 2024-05-31: 80 mg
  Filled 2024-05-31: qty 2

## 2024-05-31 MED ORDER — SODIUM CHLORIDE 0.9 % IV SOLN: 80.0000 mg | INTRAVENOUS | Status: AC

## 2024-05-31 MED ORDER — MIDAZOLAM HCL 5 MG/5ML IJ SOLN
INTRAMUSCULAR | Status: DC | PRN
  Administered 2024-05-31: 1 mg via INTRAVENOUS

## 2024-05-31 MED ORDER — VANCOMYCIN HCL IN DEXTROSE 1-5 GM/200ML-% IV SOLN: 1000.0000 mg | INTRAVENOUS | Status: AC

## 2024-05-31 MED ORDER — LIDOCAINE HCL (PF) 1 % IJ SOLN
INTRAMUSCULAR | Status: DC | PRN
  Administered 2024-05-31: 30 mL

## 2024-05-31 MED ORDER — LIDOCAINE HCL 1 % IJ SOLN
INTRAMUSCULAR | Status: AC
  Filled 2024-05-31: qty 60

## 2024-05-31 MED ORDER — CHLORHEXIDINE GLUCONATE 4 % EX SOLN
60.0000 mL | Freq: Once | CUTANEOUS | Status: AC
  Administered 2024-05-31: 4 via TOPICAL

## 2024-05-31 MED ORDER — MIDAZOLAM HCL 2 MG/2ML IJ SOLN
INTRAMUSCULAR | Status: AC
  Filled 2024-05-31: qty 2

## 2024-05-31 MED ORDER — CHLORHEXIDINE GLUCONATE 4 % EX SOLN
60.0000 mL | Freq: Once | CUTANEOUS | Status: AC
  Administered 2024-05-31: 4 via TOPICAL
  Filled 2024-05-31: qty 60

## 2024-05-31 SURGICAL SUPPLY — 12 items
CABLE SURGICAL S-101-97-12 (CABLE) ×1 IMPLANT
ICD COBALT XT CRT DTPA2D4 (ICD Generator) IMPLANT
KIT ENCORE 26 ADVANTAGE (KITS) IMPLANT
KIT LEAD END CAP (Cap) IMPLANT
LEAD SPRINT QUAT SEC 6935M-62 (Lead) IMPLANT
PAD DEFIB RADIO PHYSIO CONN (PAD) ×1 IMPLANT
POUCH AIGIS-R ANTIBACT ICD LRG (Mesh General) IMPLANT
SHEATH 9FR PRELUDE SNAP 13 (SHEATH) IMPLANT
SHEATH PROBE COVER 6X72 (BAG) IMPLANT
TRAY PACEMAKER INSERTION (PACKS) ×1 IMPLANT
WIRE ACUITY WHISPER EDS 4648 (WIRE) IMPLANT
WIRE HI TORQ VERSACORE-J 145CM (WIRE) IMPLANT

## 2024-05-31 NOTE — TOC Initial Note (Signed)
 Transition of Care [facility]) - Initial/Assessment Note    Patient Details  Name: Tanya Hogan MRN: [id number] Date of Birth: 12-20-1950  Transition of Care [facility]) CM/SW Contact:    Sudie Erminio Deems, RN Phone Number: 05/31/2024, 2:00 PM  Clinical Narrative: Patient presented for recurrent symptomatic VT. 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 Patient states she still drives to PCP appointments. Patient is active with [facility] PT/OT/Aide. [facility] is checking to see if the agency can provide RN as well. Patient will need new orders for start of care. Inpatient Case Manager will continue to follow for additional disposition needs.    Expected Discharge Plan: Home w Home Health Services Barriers to Discharge: Continued Medical Work up  Patient Goals and CMS Choice Patient states their goals for this hospitalization and ongoing recovery are:: Plan to return home once stable.   Expected Discharge Plan and Services In-house Referral: NA Discharge Planning Services: CM Consult Post Acute Care Choice: Home Health, Resumption of Svcs/PTA Provider Living arrangements for the past 2 months: Single Family Home                   DME Agency: NA       HH Arranged: PT, Nurse's Aide HH Agency: [facility] Date HH Agency Contacted: 05/31/24 Time HH Agency Contacted: [id number] Representative spoke with at [facility] Agency: [place]  Prior Living Arrangements/Services Living arrangements for the past 2 months: Single Family Home Lives with:: Siblings Patient language and need for interpreter reviewed:: Yes Do you feel safe going back to the place where you live?: Yes      Need for Family Participation in Patient Care: No (Comment) Care giver support system in place?: No (comment) Current home services: DME (rolling [facility]) Criminal Activity/Legal Involvement Pertinent to Current Situation/Hospitalization: No - Comment as  needed  Activities of Daily Living   ADL Screening (condition at time of admission) Independently performs ADLs?: Yes (appropriate for developmental age) Is the patient deaf or have difficulty hearing?: No Does the patient have difficulty seeing, even when wearing glasses/contacts?: No Does the patient have difficulty concentrating, remembering, or making decisions?: No  Permission Sought/Granted Permission sought to share information with : Case Manager, Family Supports       Permission granted to share info w AGENCY: [facility]        Emotional Assessment Appearance:: Appears stated age Attitude/Demeanor/Rapport: Engaged Affect (typically observed): Appropriate Orientation: : Oriented to Self, Oriented to Place, Oriented to  Time, Oriented to Situation Alcohol / Substance Use: Not Applicable Psych Involvement: No (comment)  Admission diagnosis:  Ventricular tachycardia (HCC) [I47.20] Patient Active Problem List   Diagnosis Date Noted   Ventricular tachycardia (HCC) 05/28/2024   SVT (supraventricular tachycardia) 05/13/2024   V tach (HCC) 05/12/2024   Brain fog 11/23/2023   Overflow diarrhea 11/23/2023   Sensation as if bladder still full 11/23/2023   Abdominal fullness 11/23/2023   Unintended weight loss 11/23/2023   Fracture of base of fifth metatarsal bone of left foot 11/02/2023   Presence of heart assist device (HCC) 10/07/2023   Primary osteoarthritis of left shoulder 09/08/2023   Injection site reaction 05/17/2023   Nonischemic cardiomyopathy (HCC) 02/16/2023   Class 3 obesity (HCC) 10/30/2022   Acute on chronic systolic CHF (congestive heart failure) (HCC) 10/29/2022   Stress and adjustment reaction 04/08/2022   Gastroesophageal reflux disease 02/16/2022   AV block, 3rd degree (HCC) 10/24/2021  COPD (chronic obstructive pulmonary disease) (HCC) 09/21/2021   Osteoporosis 02/28/2021   Primary osteoarthritis of both knees 01/14/2021   Irritable  bowel syndrome with constipation 09/16/2020   Aortic atherosclerosis 06/11/2020   Benign lipomatous neoplasm of skin and subcutaneous tissue of left leg 06/11/2020   NSVT (nonsustained ventricular tachycardia) (HCC) 02/14/2020   Chronic constipation 10/06/2019   Genetic testing 08/14/2019   Ductal carcinoma in situ (DCIS) of right breast 06/15/2019   Arthralgia 04/11/2019   Malignant tumor of breast (HCC) 03/06/2019   Fatigue 03/06/2019   DDD (degenerative disc disease), cervical 11/19/2017   Peripheral edema 05/19/2017   Microcytic anemia 05/14/2017   Delayed sleep phase syndrome 10/29/2016   Primary osteoarthritis of left ankle 09/24/2016   Gastrointestinal food sensitivity 05/21/2016   History of arthroplasty of right shoulder 02/24/2016   Bilateral foot pain 01/22/2016   NASH (nonalcoholic steatohepatitis) 05/26/2015    Class: Stage 3   Nasal septal perforation 07/11/2014   Controlled diabetes mellitus type 2 with complications (HCC) 05/25/2014   Physical deconditioning 04/24/2014   Obesity, Class II, BMI 35-39.9, with comorbidity 06/09/2013   OSA (obstructive sleep apnea) 06/08/2013   Chronic systolic heart failure (HCC) 05/23/2013   Asthma, moderate persistent 04/18/2013   Chronic cough 04/18/2013   Breast cancer of upper-inner quadrant of left female breast (HCC) 03/18/2010   Type 2 diabetes mellitus with hyperlipidemia (HCC) 11/07/2009   Fibromyalgia 10/29/2009   Biventricular cardiac pacemaker in situ 10/23/2009   Abnormal levels of other serum enzymes 10/13/2009   Hypothyroidism 10/03/2009   Essential hypertension 07/02/2009   Chronic back pain 07/02/2009   PCP:  [name], MD Pharmacy:   [facility] [phone] - [place], [place] - [address] AT [facility] [address] [place] [address] Phone: [phone] Fax: [phone]  [facility]) - [place], [place] - [address] [facility] [address] [place] [address] [place] [address] Phone: [phone] Fax: [phone]  [facility] [id number] - [place], [place] - [address] [place] [address] Phone: [phone] Fax: [phone]  [facility] - [place], [place] - [address] [place] [address] Phone: [phone] Fax: [phone]  [name] [address] [place] [address] Phone: [phone] Fax: [phone]  [facility] - [place], [place] - [address] [place] [address] Phone: [phone] Fax: [phone]  DARRYLE LONG - [facility] [address] [place] [address] Phone: [phone] Fax: [phone]  Social Drivers of Health (SDOH) Social History: SDOH Screenings   Food Insecurity: No Food Insecurity (05/28/2024)  Housing: Low Risk  (05/28/2024)  Transportation Needs: No Transportation Needs (05/28/2024)  Utilities: Not At Risk (05/28/2024)  Alcohol Screen: Low Risk  (08/10/2023)  Depression (PHQ2-9): Low Risk  (02/18/2024)  Financial Resource Strain: Medium Risk (08/10/2023)  Physical Activity: Sufficiently Active (08/10/2023)  Recent Concern: Physical Activity - Insufficiently Active (07/12/2023)  Social Connections: Moderately Integrated (05/28/2024)  Stress: No Stress Concern Present (05/08/2024)   Received from [facility]  Tobacco Use: Medium Risk (05/28/2024)  Health Literacy: Adequate Health Literacy (08/10/2023)   Readmission Risk Interventions     No data to display

## 2024-05-31 NOTE — Progress Notes (Signed)
 EP Progress Note    Patient ID: Tanya Hogan MRN: [id number]; DOB: 1951-06-15  Admit date: 05/28/2024 Date of Consult: 05/31/2024  Primary Care Provider: [name], MD [facility] Cardiologist: Redell Shallow, MD  [facility] Electrophysiologist:  Danelle Birmingham, MD   Patient Summary:   Patient seen and examined.  No issues overnight.  Had a bowel movement yesterday.  A few short runs of NSVT, less than 3-4 beats.  Asymptomatic.  No sustained ventricular arrhythmias.  Amiodarone  transition from IV to 200 mg twice daily yesterday.  Fifth dose of quinidine 200 mg 3 times daily this morning.  QTc similar to yesterday.  Past Medical History:  Diagnosis Date   Alkaline phosphatase elevation    Asthma    Breast cancer (HCC) 2011   Carpal tunnel syndrome    bilateral   Chronic HFrEF (heart failure with reduced ejection fraction) (HCC)    Cirrhosis, non-alcoholic (HCC)    Closed fracture of unspecified part of radius (alone)    DDD (degenerative disc disease), lumbar    Depression    pt denies   Diabetes mellitus without complication (HCC)    Enlarged heart    Fibromyalgia    GERD (gastroesophageal reflux disease)    History of kidney stones    HTN (hypertension)    Hypothyroidism    IBS (irritable bowel syndrome)    LBBB (left bundle branch block)    Microcytic anemia 05/14/2017   Mitral regurgitation    NICM (nonischemic cardiomyopathy) (HCC)    Pneumonia    Presence of permanent cardiac pacemaker    Pulmonary hypertension (HCC)    PVD (peripheral vascular disease)    2019   Sleep apnea    uses cpap   Tricuspid regurgitation    Ventricular tachyarrhythmia [facility])    Past Surgical History:  Procedure Laterality Date   BIV PACEMAKER GENERATOR CHANGEOUT N/A 11/03/2021   Procedure: BIV PACEMAKER GENERATOR CHANGEOUT;  Surgeon: Birmingham Danelle ORN, MD;  Location: [facility] INVASIVE CV LAB;  Service: Cardiovascular;  Laterality: N/A;   BREAST LUMPECTOMY Left 2011    COLONOSCOPY     FOOT SURGERY     KNEE SURGERY     LEAD REVISION/REPAIR N/A 11/03/2021   Procedure: LEAD REVISION/REPAIR;  Surgeon: Birmingham Danelle ORN, MD;  Location: [facility] INVASIVE CV LAB;  Service: Cardiovascular;  Laterality: N/A;   MASTECTOMY Right    05/2019   PACEMAKER GENERATOR CHANGE N/A 08/23/2014   Procedure: PACEMAKER GENERATOR CHANGE;  Surgeon: Danelle ORN Birmingham, MD;  Location: [facility] CATH LAB;  Service: Cardiovascular;  Laterality: N/A;   PACEMAKER PLACEMENT  2009   [facility]   RECTAL SURGERY  1976   REDUCTION MAMMAPLASTY Left    05/2019   RIGHT/LEFT HEART CATH AND CORONARY ANGIOGRAPHY N/A 05/18/2024   Procedure: RIGHT/LEFT HEART CATH AND CORONARY ANGIOGRAPHY;  Surgeon: Wendel Lurena POUR, MD;  Location: [facility]VASIVE CV LAB;  Service: Cardiovascular;  Laterality: N/A;   TOTAL SHOULDER ARTHROPLASTY Right 03/10/2018   TOTAL SHOULDER ARTHROPLASTY Right 03/10/2018   Procedure: RIGHT TOTAL SHOULDER ARTHROPLASTY;  Surgeon: Dozier Soulier, MD;  Location: [facility] OR;  Service: Orthopedics;  Laterality: Right;   TUBAL LIGATION     UPPER EXTREMITY VENOGRAPHY  05/18/2024   Procedure: UPPER EXTREMITY VENOGRAPHY;  Surgeon: Wendel Lurena POUR, MD;  Location: [facility] INVASIVE CV LAB;  Service: Cardiovascular;;    Home Medications:  Prior to Admission medications   Medication Sig Start Date End Date Taking? Authorizing Provider  acetaminophen  (TYLENOL ) 325 MG tablet  Take 325 mg by mouth every 6 (six) hours as needed for mild pain (pain score 1-3).   Yes [provider]  albuterol  (PROVENTIL ) (2.5 MG/3ML) 0.083% nebulizer solution Take 2.5 mg by nebulization every 6 (six) hours as needed for wheezing or shortness of breath.   Yes [provider]  alendronate  (FOSAMAX ) 70 MG tablet TAKE 1 TABLET BY MOUTH EVERY 7  DAYS WITH FULL GLASS OF WATER  ON AN EMPTY STOMACH 05/26/24  Yes [name], MD  amiodarone  (PACERONE ) 200 MG tablet Take 1 tablet (200 mg total) by mouth daily. 11/10/23  Yes  Pietro Redell RAMAN, MD  aspirin  81 MG chewable tablet Chew 81 mg by mouth daily.   Yes [provider]  Calcium  Carbonate-Vit D-Min (CALCIUM  600+D PLUS MINERALS) 600-400 MG-UNIT TABS 1 tab p.o. twice daily Patient taking differently: Take 2 tablets by mouth at bedtime. 11/02/23  Yes Curtis Debby PARAS, MD  estradiol (ESTRACE) 0.1 MG/GM vaginal cream Place 1 Applicatorful vaginally 3 (three) times a week. Patient taking differently: Place 1 Applicatorful vaginally once a week.   Yes [provider]  fexofenadine  (ALLEGRA ) 180 MG tablet Take 180 mg by mouth daily.   Yes [provider]  fluticasone -salmeterol (WIXELA INHUB ) 250-50 MCG/ACT AEPB Inhale 1 puff into the lungs in the morning and at bedtime.   Yes [provider]  GEMTESA 75 MG TABS Take 75 mg by mouth daily. 01/11/24  Yes [provider]  isosorbide  mononitrate (IMDUR ) 30 MG 24 hr tablet TAKE 1 TABLET BY MOUTH DAILY 05/26/24  Yes Pietro Redell RAMAN, MD  levothyroxine  (SYNTHROID ) 112 MCG tablet TAKE 1 TABLET BY MOUTH DAILY 05/04/24  Yes [name], MD  lubiprostone (AMITIZA) 24 MCG capsule Take 24 mcg by mouth 2 (two) times daily with a meal. Patient reports provider has decreased to every three days. Patient taking differently: Take 24 mcg by mouth daily with breakfast.   Yes [provider]  metFORMIN  (GLUCOPHAGE -XR) 500 MG 24 hr tablet TAKE 1 TABLET BY MOUTH TWICE  DAILY WITH MEALS 04/19/24  Yes [name], MD  metoprolol succinate (TOPROL-XL) 50 MG 24 hr tablet Take 1 tablet (50 mg total) by mouth 2 (two) times daily. Take with or immediately following a meal. 05/24/24  Yes Leverne Charlies Helling, PA-C  mexiletine (MEXITIL ) 150 MG capsule Take 1 capsule (150 mg total) by mouth every 12 (twelve) hours. 05/24/24  Yes Leverne Charlies Helling, PA-C  Multiple Vitamins-Minerals (MULTIPLE VITAMINS/WOMENS PO) Take 1 tablet by mouth daily. Patient taking differently: Take 1 tablet by  mouth daily. Patient takes MVI with iron   Yes [provider]  Multiple Vitamins-Minerals (OCUVITE EYE + MULTI) TABS Take 1 tablet by mouth in the morning.   Yes [provider]  pantoprazole  (PROTONIX ) 40 MG tablet TAKE 1 TABLET BY MOUTH DAILY  BEFORE BREAKFAST Patient taking differently: Take 40 mg by mouth 2 (two) times daily. 10/20/22  Yes [name], MD  pravastatin  (PRAVACHOL ) 40 MG tablet Take 40 mg by mouth daily. 01/26/24  Yes [provider]  Tiotropium Bromide  Monohydrate (SPIRIVA  RESPIMAT) 1.25 MCG/ACT AERS Inhale 1 puff into the lungs daily.   Yes [provider]  torsemide  (DEMADEX ) 20 MG tablet TAKE 1 TABLET BY MOUTH DAILY 05/26/24  Yes Crenshaw, Redell RAMAN, MD  valACYclovir  (VALTREX ) 1000 MG tablet Take 1 tablet (1,000 mg total) by mouth 2 (two) times daily. Patient taking differently: Take 1,000 mg by mouth daily as needed (fever blisters).  05/17/23  Yes Alvia Bring, DO  quiNIDine sulfate 300 MG tablet Take 1 tablet (300 mg total) by mouth 3 (three) times daily. 05/29/24 05/29/24 Yes TilleryOzell Barter, PA-C  amiodarone  (PACERONE ) 200 MG tablet Take 1 tablet (200 mg total) by mouth 2 (two) times daily for 14 days. Patient not taking: Reported on 05/28/2024 05/24/24 06/07/24  Leverne Charlies Helling, PA-C  Continuous Glucose Receiver (FREESTYLE LIBRE 3 READER) DEVI Use for continuous glucose monitoring 04/10/24   [name], MD  Continuous Glucose Sensor (FREESTYLE LIBRE 3 SENSOR) MISC by Does not apply route. Change every 14 days    [provider]  EPINEPHrine  0.3 mg/0.3 mL IJ SOAJ injection Inject 0.3 mg into the muscle as needed for anaphylaxis. 08/27/23   [name], MD  glucose blood test strip To be used to check blood glucose Dx:E11.8 FreeStyle Precision blood glucose for Freestyle Libre 2 05/02/24   [name], MD    Inpatient Medications: Scheduled Meds:  amiodarone   200 mg Oral BID   bisacodyl    5 mg Oral BID   chlorhexidine   60 mL Topical Once   chlorhexidine   60 mL Topical Once   fluticasone  furoate-vilanterol  1 puff Inhalation Daily   insulin  aspart  0-15 Units Subcutaneous TID WC   insulin  aspart  0-5 Units Subcutaneous QHS   isosorbide  mononitrate  30 mg Oral Daily   levothyroxine   112 mcg Oral Daily   loratadine   10 mg Oral Daily   lubiprostone  24 mcg Oral Q3 days   metFORMIN   500 mg Oral BID WC   metoprolol succinate  50 mg Oral Daily   mirabegron ER  25 mg Oral Daily   multivitamin  1 tablet Oral Daily   pantoprazole   40 mg Oral BID   pravastatin   40 mg Oral Daily   quiNIDine sulfate  200 mg Oral TID   torsemide   20 mg Oral Daily   umeclidinium bromide   1 puff Inhalation Daily   Continuous Infusions:  sodium chloride  50 mL/hr at 05/31/24 0558   PRN Meds: acetaminophen , albuterol , LORazepam , ondansetron  (ZOFRAN ) IV, zolpidem   Allergies:    Allergies  Allergen Reactions   Injectafer [Ferric Carboxymaltose] Shortness Of Breath    Mild SOB following injectafer infusion Mild SOB following injectafer infusion    Penicillins Anaphylaxis and Rash    Has patient had a PCN reaction causing immediate rash, facial/tongue/throat swelling, SOB or lightheadedness with hypotension: Yes Has patient had a PCN reaction causing severe rash involving mucus membranes or skin necrosis: No Has patient had a PCN reaction that required hospitalization: No Has patient had a PCN reaction occurring within the last 10 years: No If all of the above answers are NO, then may proceed with Cephalosporin use. Stuttered with medication     Accupril [Quinapril Hcl] Other (See Comments)    Hyperkalemia=high potassium level   Cozaar  [Losartan  Potassium] Itching   Dml Forte Rash   Entresto [Sacubitril-Valsartan] Other (See Comments)    Hyperkalemia   Flonase  [Fluticasone ] Other (See Comments)    Nasal ulcer.     Lactose Intolerance (Gi) Other (See Comments)    Increased calcium ,  sneezing, nasal issues   Lipitor [Atorvastatin ] Other (See Comments)    muscle aches/pains   Nasonex  [Mometasone  Furoate] Other (See Comments)    nosebleed   Nsaids Other (See Comments)    Contraindication due to ulcers and coughing up blood    Aspirin  Other (See Comments)    Pt states burns  holes in stomach, ulcers    Baking Soda-Fluoride [Sodium Fluoride] Itching and Rash   Chamomile Hives   Clindamycin /Lincomycin Rash   Codeine Nausea And Vomiting   Furosemide  Other (See Comments)    Ears ring, drops bp quickly. Is okay to take smaller doses.    Lanolin-Petrolatum Rash   Miralax  [Polyethylene Glycol] Rash   Quinine Rash   Reclast  [Zoledronic  Acid] Rash and Other (See Comments)    shaking   Rifampin Other (See Comments)    DOES NOT REMEMBER THIS MEDICATION OR ALLERGY   Singulair  [Montelukast ] Other (See Comments)    dizziness   Sulfa Antibiotics Rash   Tramadol  Rash and Other (See Comments)    Anxiety   Vesicare  [Solifenacin ] Other (See Comments)    stopped me from urinating   Wellbutrin  [Bupropion ] Other (See Comments)    Muscle aches, pain, made my skin crawl   Wool Alcohol [Lanolin] Rash   Social History:   Social History   Socioeconomic History   Marital status: Widowed    Spouse name: Not on file   Number of children: 2   Years of education: 14   Highest education level: Associate degree: academic program  Occupational History   Occupation: quality insurcance in Sports coach    Comment: retired  Tobacco Use   Smoking status: Former    Current packs/day: 0.00    Types: Cigarettes    Quit date: 08/24/1966    Years since quitting: 57.8   Smokeless tobacco: Never  Vaping Use   Vaping status: Never Used  Substance and Sexual Activity   Alcohol use: No   Drug use: No   Sexual activity: Not Currently  Other Topics Concern   Not on file  Social History Narrative   Retired. Lives with and helps her brother. Likes Risk manager, Nutritional therapist and  music. Loves animals.   Social Drivers of Health   Financial Resource Strain: Medium Risk (08/10/2023)   Overall Financial Resource Strain (CARDIA)    Difficulty of Paying Living Expenses: Somewhat hard  Food Insecurity: No Food Insecurity (05/28/2024)   Hunger Vital Sign    Worried About Running Out of Food in the Last Year: Never true    Ran Out of Food in the Last Year: Never true  Transportation Needs: No Transportation Needs (05/28/2024)   PRAPARE - Administrator, Civil Service (Medical): No    Lack of Transportation (Non-Medical): No  Physical Activity: Sufficiently Active (08/10/2023)   Exercise Vital Sign    Days of Exercise per Week: 3 days    Minutes of Exercise per Session: 50 min  Recent Concern: Physical Activity - Insufficiently Active (07/12/2023)   Exercise Vital Sign    Days of Exercise per Week: 1 day    Minutes of Exercise per Session: 10 min  Stress: No Stress Concern Present (05/08/2024)   Received from [facility] of Occupational Health - Occupational Stress Questionnaire    Do you feel stress - tense, restless, nervous, or anxious, or unable to sleep at night because your mind is troubled all the time - these days?: Not at all  Social Connections: Moderately Integrated (05/28/2024)   Social Connection and Isolation Panel    Frequency of Communication with Friends and Family: More than three times a week    Frequency of Social Gatherings with Friends and Family: More than three times a week    Attends Religious Services: More than 4 times per year  Active Member of Clubs or Organizations: Yes    Attends Banker Meetings: More than 4 times per year    Marital Status: Divorced  Intimate Partner Violence: Not At Risk (05/28/2024)   Humiliation, Afraid, Rape, and Kick questionnaire    Fear of Current or Ex-Partner: No    Emotionally Abused: No    Physically Abused: No    Sexually Abused: No    Family History:     Family History  Problem Relation Age of Onset   Heart disease Mother    Melanoma Mother    Parkinson's disease Mother    Heart disease Father    Heart disease Brother    Heart failure Other    Breast cancer Other    Hypertension Other     ROS:  Review of Systems: [y] = yes, [ ]  = no      General: Weight gain [ ] ; Weight loss [ ] ; Anorexia [ ] ; Fatigue [ ] ; Fever [ ] ; Chills [ ] ; Weakness [ ]    Cardiac: Chest pain/pressure [ ] ; Resting SOB [ ] ; Exertional SOB [ ] ; Orthopnea [ ] ; Pedal Edema [ ] ; Palpitations [ ] ; Syncope [ ] ; Presyncope [ ] ; Paroxysmal nocturnal dyspnea [ ]    Pulmonary: Cough [ ] ; Wheezing [ ] ; Hemoptysis [ ] ; Sputum [ ] ; Snoring [ ]    GI: Vomiting [ ] ; Dysphagia [ ] ; Melena [ ] ; Hematochezia [ ] ; Heartburn [ ] ; Abdominal pain [ ] ; Constipation [ ] ; Diarrhea [ ] ; BRBPR [ ]    GU: Hematuria [ ] ; Dysuria [ ] ; Nocturia [ ]  Vascular: Pain in legs with walking [ ] ; Pain in feet with lying flat [ ] ; Non-healing sores [ ] ; Stroke [ ] ; TIA [ ] ; Slurred speech [ ] ;   Neuro: Headaches [ ] ; Vertigo [ ] ; Seizures [ ] ; Paresthesias [ ] ;Blurred vision [ ] ; Diplopia [ ] ; Vision changes [ ]    Ortho/Skin: Arthritis [ ] ; Joint pain [ ] ; Muscle pain [ ] ; Joint swelling [ ] ; Back Pain [ ] ; Rash [ ]    Psych: Depression [ ] ; Anxiety [ ]    Heme: Bleeding problems [ ] ; Clotting disorders [ ] ; Anemia [ ]    Endocrine: Diabetes [ ] ; Thyroid  dysfunction [ ]    Physical Exam/Data:   Vitals:   05/30/24 1537 05/30/24 1952 05/30/24 2316 05/31/24 0439  BP: (!) 98/56 107/66 (!) 104/57 111/75  Pulse: 89 83 88 89  Resp: 18 18 16 18   Temp: 97.7 F (36.5 C) 97.7 F (36.5 C) 97.8 F (36.6 C) 97.8 F (36.6 C)  TempSrc: Oral Oral Oral Oral  SpO2: 96% 92% 94% 96%  Weight:      Height:       No intake or output data in the 24 hours ending 05/31/24 0632    05/28/2024    3:26 PM 05/28/2024   11:18 AM 05/26/2024   12:22 PM  Last 3 Weights  Weight (lbs) 184 lb 1.6 oz 184 lb 186 lb  Weight (kg)  83.507 kg 83.462 kg 84.369 kg     Body mass index is 34.79 kg/m.  General:  Well nourished, well developed, in no acute distress HEENT: normal Vascular: No carotid bruits; FA pulses 2+ bilaterally without bruits  Cardiac:  normal S1, S2; RRR; no murmur  Lungs:  clear to auscultation bilaterally, no wheezing, rhonchi or rales  Ext: no edema Musculoskeletal:  No deformities, BUE and BLE strength normal and equal Skin: left infraclavicular device without surrounding erythema  or swelling  Psych:  Normal affect   EKG:  The EKG was personally reviewed and demonstrates: APVP 89, PR 120, QRS 164, QT/c 482/586 (similar to prior)  Telemetry:  Telemetry was personally reviewed and demonstrates:  APVP 90s, occasional PVCs and short NSVT  Relevant CV Studies:  TTE Result date: 05/13/24  1. No apical thrombus with Definity  contrast. Left ventricular ejection  fraction, by estimation, is 20 to 25%. Left ventricular ejection fraction  by 2D MOD biplane is 25.1 %. The left ventricle has severely decreased  function. The left ventricle  demonstrates global hypokinesis. The left ventricular internal cavity size  was moderately dilated. Left ventricular diastolic parameters are  consistent with Grade II diastolic dysfunction (pseudonormalization).  Elevated left ventricular end-diastolic  pressure.   2. Right ventricular systolic function is normal. The right ventricular  size is moderately enlarged. There is severely elevated pulmonary artery  systolic pressure. The estimated right ventricular systolic pressure is  64.6 mmHg.   3. Left atrial size was severely dilated.   4. Right atrial size was severely dilated.   5. The mitral valve is degenerative. Mild mitral valve regurgitation.  Moderate mitral annular calcification.   6. The tricuspid valve is abnormal. Tricuspid valve regurgitation is mild  to moderate.   7. The aortic valve is tricuspid. Aortic valve regurgitation is not  visualized.  Aortic valve sclerosis/calcification is present, without any  evidence of aortic stenosis.   8. The inferior vena cava is dilated in size with <50% respiratory  variability, suggesting right atrial pressure of 15 mmHg.   Laboratory Data:  Chemistry Recent Labs  Lab 05/28/24 1135  NA 137  K 4.0  CL 101  CO2 24  GLUCOSE 139*  BUN 19  CREATININE 0.86  CALCIUM  10.7*  GFRNONAA >60  ANIONGAP 11   Hematology Recent Labs  Lab 05/28/24 1135  WBC 7.7  RBC 4.09  HGB 13.3  HCT 40.3  MCV 98.5  MCH 32.5  MCHC 33.0  RDW 13.2  PLT 313   Radiology/Studies:  DG Chest Portable 1 View Result Date: 05/28/2024 CLINICAL DATA:  fatigue, SOB, near syncope EXAM: PORTABLE CHEST 1 VIEW COMPARISON:  May 12, 2024 FINDINGS: The cardiomediastinal silhouette is unchanged and enlarged in contour.LEFT chest cardiac pacing device. No pleural effusion. No pneumothorax. Mildly increased reticular prominence diffusely. Mild peribronchial cuffing. IMPRESSION: Constellation of findings are favored to reflect mild pulmonary edema. Electronically Signed   By: Corean Salter M.D.   On: 05/28/2024 11:58   Assessment and Plan:  Tanya Hogan is a 73 y.o. female with a history of HTN, HLD, DM, LBBB, NICM, OSA w/CPAP, CHB, chronic CHF (systolic), w/CRT-P, PVCs, Abandoned RV lead, and breast Ca (s/p mastectomy) and VT admitted for recurrent VT/VT storm.   VT storm No recurrent sustained VT since transitioning from IV to p.o. amiodarone  with addition of clonidine.  She is tolerating antiarrhythmics without side effects.  Continues on Toprol XL 50 mg daily, Amio 200 mg twice daily and quinidine 200 mg 3 times daily.  Planning for device upgrade to CRT-D with addition of RV D lead to allow for ATP.  Reviewed risk, benefits and alternatives again for ICD upgrade and she is agreeable to proceed.  2. HFrEF/NICM Compensated today, on torsemide  20 mg daily (home dose), toprol XL 50 mg daily.   For questions or  updates, please contact [facility] Please consult [email or web address] for contact info under   Signed, [name], MD  05/31/2024 6:32  AM

## 2024-05-31 NOTE — Plan of Care (Signed)
  Problem: Education: Goal: Knowledge of General Education information will improve Description: Including pain rating scale, medication(s)/side effects and non-pharmacologic comfort measures Outcome: Progressing   Problem: Health Behavior/Discharge Planning: Goal: Ability to manage health-related needs will improve Outcome: Progressing   Problem: Clinical Measurements: Goal: Ability to maintain clinical measurements within normal limits will improve Outcome: Progressing Goal: Will remain free from infection Outcome: Progressing Goal: Diagnostic test results will improve Outcome: Progressing Goal: Respiratory complications will improve Outcome: Progressing Goal: Cardiovascular complication will be avoided Outcome: Progressing   Problem: Activity: Goal: Risk for activity intolerance will decrease Outcome: Progressing   Problem: Nutrition: Goal: Adequate nutrition will be maintained Outcome: Progressing   Problem: Coping: Goal: Level of anxiety will decrease Outcome: Progressing   Problem: Elimination: Goal: Will not experience complications related to bowel motility Outcome: Progressing Goal: Will not experience complications related to urinary retention Outcome: Progressing   Problem: Pain Managment: Goal: General experience of comfort will improve and/or be controlled Outcome: Progressing   Problem: Safety: Goal: Ability to remain free from injury will improve Outcome: Progressing   Problem: Skin Integrity: Goal: Risk for impaired skin integrity will decrease Outcome: Progressing   Problem: Education: Goal: Ability to describe self-care measures that may prevent or decrease complications (Diabetes Survival Skills Education) will improve Outcome: Progressing Goal: Individualized Educational Video(s) Outcome: Progressing   Problem: Coping: Goal: Ability to adjust to condition or change in health will improve Outcome: Progressing   Problem: Fluid  Volume: Goal: Ability to maintain a balanced intake and output will improve Outcome: Progressing   Problem: Health Behavior/Discharge Planning: Goal: Ability to identify and utilize available resources and services will improve Outcome: Progressing Goal: Ability to manage health-related needs will improve Outcome: Progressing   Problem: Metabolic: Goal: Ability to maintain appropriate glucose levels will improve Outcome: Progressing   Problem: Nutritional: Goal: Maintenance of adequate nutrition will improve Outcome: Progressing Goal: Progress toward achieving an optimal weight will improve Outcome: Progressing   Problem: Skin Integrity: Goal: Risk for impaired skin integrity will decrease Outcome: Progressing   Problem: Tissue Perfusion: Goal: Adequacy of tissue perfusion will improve Outcome: Progressing   Problem: Education: Goal: Ability to manage disease process will improve Outcome: Progressing Goal: Individualized Educational Video(s) Outcome: Progressing   Problem: Cardiac: Goal: Ability to achieve and maintain adequate cardiopulmonary perfusion will improve Outcome: Progressing   Problem: Education: Goal: Knowledge of cardiac device and self-care will improve Outcome: Progressing Goal: Ability to safely manage health related needs after discharge will improve Outcome: Progressing Goal: Individualized Educational Video(s) Outcome: Progressing   Problem: Cardiac: Goal: Ability to achieve and maintain adequate cardiopulmonary perfusion will improve Outcome: Progressing

## 2024-05-31 NOTE — Op Note (Signed)
   Procedure:  LEFT sided MDT CRTP/LOT->CRTD upgrade Add RV-defib lead   Pre-op  Diagnosis: VT storm     Post-op Diagnosis: same  Procedure Date: 05/31/24  Attending: Adina Hogan   Anesthesia: monitored anesthesia care   Procedure Report:  The LEFT shoulder was prepped with chlorhexidine  scrub. Lidocaine  was injected at the planned incision site. A pocket was created with electrocautery and blunt dissection.    No venogram was performed. The LEFT axillary vein was accessed using a standard access needle under ultrasound guidance. A wire was passed into the IVC.    An 8 Fr sheath was advanced and the dilator removed. The RV ICD lead was inserted into the sheath and the lead was then advanced into the RVOT. After exchange of pigtail shaped stylet for a curved stylet, the lead was retracted into the RV cavity and advanced to the RV apical septum. Two view flouroscopy then confirmed RV apical, septal position. The helix was extended and the lead fixation was tested with lead tension. Interrogation revealed a current of injury with acceptable lead parameters (see below). The sheath was split and removed and the RV ICD lead was firmly attached to the pre-pectoralis fascia with two 0-silk sutures.   The device was then connected to the leads. The existing LV (AIV) lead was capped/abandoned. The pocket was irrigated with gentamicin . A TYRX pouch was used. The pocket was closed with continuous 2-0 and 3-0 V-loc as well as dermabond for the superficial layer. Steri-strips were applied and a vaso-occlusive dressing was placed. A pressure dressing was applied for hemostasis.  The device was evaluated by the representative of the cardiac device manufacturer under the supervision of the attending physician with implant parameters listed below.   Complications: none Estimated blood loss: less than 50 mL  Implanted Hardware: Implant Name Type Inv. Item Serial No. Manufacturer Lot No. LRB No. Used  Action  LEAD SPRINT QUAT SEC [id number] - [id number] Lead LEAD SPRINT QUAT SEC 6935M-62 [id number] V [employer]  N/A 1 Implanted  ICD [place] XT CRT DTPA2D4 - [id number] ICD Generator ICD [place] XT CRT DTPA2D4 [id number] S2002 [employer]  N/A 1 Implanted  PPM PRECEPTA MRI CRT-P W1TR01 - [id number] S Pacemaker PACEMAKER PRCT MRI CRTP T8UM98 [id number] S [employer]  Left 1 Explanted  POUCH AIGIS-R ANTIBACT ICD LRG - [id number] Mesh General POUCH AIGIS-R ANTIBACT ICD LRG  [employer] [id number] Left 1 Implanted  KIT LEAD END CAP - [id number] Cap KIT LEAD END CAP  [employer] [id number] Left 1 Implanted   Device Information: CRTD Generator: MDT CRTD DPTA2D4, SN [id number], DOI 05/31/24 RA: MDT 5931-54, SN [id number] V, DOI 11/14/01 RV/LBaP: MDT 3830-69, [id number], DOI 11/03/21 LV (CS/AIV): MDT [id number], SN [id number], DOI 11/14/01, capped/abandoned: 05/31/24  Lead Interrogation Data: RA: 1.0 mV / 437 ohms / 0.75 V @ 0.4 ms RV/LBaP: CHB / 475 / 1.5 V @ 0.4 ms RVd: CHB / 608 ohms / 0.75 V @ 0.4 ms Shock Impedance: 91 ohms  Summary: 1. Successful LEFT MDT CRTP->CRTD (add RV-defib lead)   Recommendations: Routine post-procedure care with bedrest for 3 hours No heparin  (IV or subcutaneous) for 48 hours. No enoxaparin  (IV or subcutaneous) for 7 days.  PA/lateral CXR in AM Wound check in AM Device interrogation in AM NPO at midnight  Tanya DELENA Primus, MD [facility]  Cardiac Electrophysiology

## 2024-05-31 NOTE — Interval H&P Note (Signed)
 History and Physical Interval Note:  Evaluated this morning and no changes from yesterday.  Reviewed risk, benefits and alternatives of LEFT MDT CRTP->CRTD (add RV-d) lead.  These include but are not limited to device site pain, bleeding, infection, damage to existing leads, damage to the tricuspid valve, damage to the lung or heart requiring drain and/or surgery, and inability to access the vein secondary to subclavian stenosis.  She understands these risk and is willing to proceed.  05/31/2024 6:37 AM  Tanya Hogan  has presented today for surgery, with the diagnosis of lead malfunciton.  The various methods of treatment have been discussed with the patient and family. After consideration of risks, benefits and other options for treatment, the patient has consented to  Procedure(s): BIV ICD INSERTION CRT-D (N/A) LEAD INSERTION (N/A) as a surgical intervention.  The patient's history has been reviewed, patient examined, no change in status, stable for surgery.  I have reviewed the patient's chart and labs.  Questions were answered to the patient's satisfaction.     Tanya Hogan

## 2024-05-31 NOTE — H&P (View-Only) (Signed)
 EP Progress Note    Patient ID: Tanya Hogan MRN: [id number]; DOB: 1951-06-15  Admit date: 05/28/2024 Date of Consult: 05/31/2024  Primary Care Provider: [name], MD [facility] Cardiologist: Redell Shallow, MD  [facility] Electrophysiologist:  Danelle Birmingham, MD   Patient Summary:   Patient seen and examined.  No issues overnight.  Had a bowel movement yesterday.  A few short runs of NSVT, less than 3-4 beats.  Asymptomatic.  No sustained ventricular arrhythmias.  Amiodarone  transition from IV to 200 mg twice daily yesterday.  Fifth dose of quinidine 200 mg 3 times daily this morning.  QTc similar to yesterday.  Past Medical History:  Diagnosis Date   Alkaline phosphatase elevation    Asthma    Breast cancer (HCC) 2011   Carpal tunnel syndrome    bilateral   Chronic HFrEF (heart failure with reduced ejection fraction) (HCC)    Cirrhosis, non-alcoholic (HCC)    Closed fracture of unspecified part of radius (alone)    DDD (degenerative disc disease), lumbar    Depression    pt denies   Diabetes mellitus without complication (HCC)    Enlarged heart    Fibromyalgia    GERD (gastroesophageal reflux disease)    History of kidney stones    HTN (hypertension)    Hypothyroidism    IBS (irritable bowel syndrome)    LBBB (left bundle branch block)    Microcytic anemia 05/14/2017   Mitral regurgitation    NICM (nonischemic cardiomyopathy) (HCC)    Pneumonia    Presence of permanent cardiac pacemaker    Pulmonary hypertension (HCC)    PVD (peripheral vascular disease)    2019   Sleep apnea    uses cpap   Tricuspid regurgitation    Ventricular tachyarrhythmia [facility])    Past Surgical History:  Procedure Laterality Date   BIV PACEMAKER GENERATOR CHANGEOUT N/A 11/03/2021   Procedure: BIV PACEMAKER GENERATOR CHANGEOUT;  Surgeon: Birmingham Danelle ORN, MD;  Location: [facility] INVASIVE CV LAB;  Service: Cardiovascular;  Laterality: N/A;   BREAST LUMPECTOMY Left 2011    COLONOSCOPY     FOOT SURGERY     KNEE SURGERY     LEAD REVISION/REPAIR N/A 11/03/2021   Procedure: LEAD REVISION/REPAIR;  Surgeon: Birmingham Danelle ORN, MD;  Location: [facility] INVASIVE CV LAB;  Service: Cardiovascular;  Laterality: N/A;   MASTECTOMY Right    05/2019   PACEMAKER GENERATOR CHANGE N/A 08/23/2014   Procedure: PACEMAKER GENERATOR CHANGE;  Surgeon: Danelle ORN Birmingham, MD;  Location: [facility] CATH LAB;  Service: Cardiovascular;  Laterality: N/A;   PACEMAKER PLACEMENT  2009   [facility]   RECTAL SURGERY  1976   REDUCTION MAMMAPLASTY Left    05/2019   RIGHT/LEFT HEART CATH AND CORONARY ANGIOGRAPHY N/A 05/18/2024   Procedure: RIGHT/LEFT HEART CATH AND CORONARY ANGIOGRAPHY;  Surgeon: Wendel Lurena POUR, MD;  Location: [facility]VASIVE CV LAB;  Service: Cardiovascular;  Laterality: N/A;   TOTAL SHOULDER ARTHROPLASTY Right 03/10/2018   TOTAL SHOULDER ARTHROPLASTY Right 03/10/2018   Procedure: RIGHT TOTAL SHOULDER ARTHROPLASTY;  Surgeon: Dozier Soulier, MD;  Location: [facility] OR;  Service: Orthopedics;  Laterality: Right;   TUBAL LIGATION     UPPER EXTREMITY VENOGRAPHY  05/18/2024   Procedure: UPPER EXTREMITY VENOGRAPHY;  Surgeon: Wendel Lurena POUR, MD;  Location: [facility] INVASIVE CV LAB;  Service: Cardiovascular;;    Home Medications:  Prior to Admission medications   Medication Sig Start Date End Date Taking? Authorizing Provider  acetaminophen  (TYLENOL ) 325 MG tablet  Take 325 mg by mouth every 6 (six) hours as needed for mild pain (pain score 1-3).   Yes [provider]  albuterol  (PROVENTIL ) (2.5 MG/3ML) 0.083% nebulizer solution Take 2.5 mg by nebulization every 6 (six) hours as needed for wheezing or shortness of breath.   Yes [provider]  alendronate  (FOSAMAX ) 70 MG tablet TAKE 1 TABLET BY MOUTH EVERY 7  DAYS WITH FULL GLASS OF WATER  ON AN EMPTY STOMACH 05/26/24  Yes [name], MD  amiodarone  (PACERONE ) 200 MG tablet Take 1 tablet (200 mg total) by mouth daily. 11/10/23  Yes  Pietro Redell RAMAN, MD  aspirin  81 MG chewable tablet Chew 81 mg by mouth daily.   Yes [provider]  Calcium  Carbonate-Vit D-Min (CALCIUM  600+D PLUS MINERALS) 600-400 MG-UNIT TABS 1 tab p.o. twice daily Patient taking differently: Take 2 tablets by mouth at bedtime. 11/02/23  Yes Curtis Debby PARAS, MD  estradiol (ESTRACE) 0.1 MG/GM vaginal cream Place 1 Applicatorful vaginally 3 (three) times a week. Patient taking differently: Place 1 Applicatorful vaginally once a week.   Yes [provider]  fexofenadine  (ALLEGRA ) 180 MG tablet Take 180 mg by mouth daily.   Yes [provider]  fluticasone -salmeterol (WIXELA INHUB ) 250-50 MCG/ACT AEPB Inhale 1 puff into the lungs in the morning and at bedtime.   Yes [provider]  GEMTESA 75 MG TABS Take 75 mg by mouth daily. 01/11/24  Yes [provider]  isosorbide  mononitrate (IMDUR ) 30 MG 24 hr tablet TAKE 1 TABLET BY MOUTH DAILY 05/26/24  Yes Crenshaw, Redell RAMAN, MD  levothyroxine  (SYNTHROID ) 112 MCG tablet TAKE 1 TABLET BY MOUTH DAILY 05/04/24  Yes [name], MD  lubiprostone (AMITIZA) 24 MCG capsule Take 24 mcg by mouth 2 (two) times daily with a meal. Patient reports provider has decreased to every three days. Patient taking differently: Take 24 mcg by mouth daily with breakfast.   Yes [provider]  metFORMIN  (GLUCOPHAGE -XR) 500 MG 24 hr tablet TAKE 1 TABLET BY MOUTH TWICE  DAILY WITH MEALS 04/19/24  Yes [name], MD  metoprolol succinate (TOPROL-XL) 50 MG 24 hr tablet Take 1 tablet (50 mg total) by mouth 2 (two) times daily. Take with or immediately following a meal. 05/24/24  Yes Leverne Charlies Helling, PA-C  mexiletine (MEXITIL ) 150 MG capsule Take 1 capsule (150 mg total) by mouth every 12 (twelve) hours. 05/24/24  Yes Leverne Charlies Helling, PA-C  Multiple Vitamins-Minerals (MULTIPLE VITAMINS/WOMENS PO) Take 1 tablet by mouth daily. Patient taking differently: Take 1 tablet by  mouth daily. Patient takes MVI with iron   Yes [provider]  Multiple Vitamins-Minerals (OCUVITE EYE + MULTI) TABS Take 1 tablet by mouth in the morning.   Yes [provider]  pantoprazole  (PROTONIX ) 40 MG tablet TAKE 1 TABLET BY MOUTH DAILY  BEFORE BREAKFAST Patient taking differently: Take 40 mg by mouth 2 (two) times daily. 10/20/22  Yes [name], MD  pravastatin  (PRAVACHOL ) 40 MG tablet Take 40 mg by mouth daily. 01/26/24  Yes [provider]  Tiotropium Bromide  Monohydrate (SPIRIVA  RESPIMAT) 1.25 MCG/ACT AERS Inhale 1 puff into the lungs daily.   Yes [provider]  torsemide  (DEMADEX ) 20 MG tablet TAKE 1 TABLET BY MOUTH DAILY 05/26/24  Yes Crenshaw, Redell RAMAN, MD  valACYclovir  (VALTREX ) 1000 MG tablet Take 1 tablet (1,000 mg total) by mouth 2 (two) times daily. Patient taking differently: Take 1,000 mg by mouth daily as needed (fever blisters).  05/17/23  Yes Alvia Bring, DO  quiNIDine sulfate 300 MG tablet Take 1 tablet (300 mg total) by mouth 3 (three) times daily. 05/29/24 05/29/24 Yes TilleryOzell Barter, PA-C  amiodarone  (PACERONE ) 200 MG tablet Take 1 tablet (200 mg total) by mouth 2 (two) times daily for 14 days. Patient not taking: Reported on 05/28/2024 05/24/24 06/07/24  Leverne Charlies Helling, PA-C  Continuous Glucose Receiver (FREESTYLE LIBRE 3 READER) DEVI Use for continuous glucose monitoring 04/10/24   [name], MD  Continuous Glucose Sensor (FREESTYLE LIBRE 3 SENSOR) MISC by Does not apply route. Change every 14 days    [provider]  EPINEPHrine  0.3 mg/0.3 mL IJ SOAJ injection Inject 0.3 mg into the muscle as needed for anaphylaxis. 08/27/23   [name], MD  glucose blood test strip To be used to check blood glucose Dx:E11.8 FreeStyle Precision blood glucose for Freestyle Libre 2 05/02/24   [name], MD    Inpatient Medications: Scheduled Meds:  amiodarone   200 mg Oral BID   bisacodyl    5 mg Oral BID   chlorhexidine   60 mL Topical Once   chlorhexidine   60 mL Topical Once   fluticasone  furoate-vilanterol  1 puff Inhalation Daily   insulin  aspart  0-15 Units Subcutaneous TID WC   insulin  aspart  0-5 Units Subcutaneous QHS   isosorbide  mononitrate  30 mg Oral Daily   levothyroxine   112 mcg Oral Daily   loratadine   10 mg Oral Daily   lubiprostone  24 mcg Oral Q3 days   metFORMIN   500 mg Oral BID WC   metoprolol succinate  50 mg Oral Daily   mirabegron ER  25 mg Oral Daily   multivitamin  1 tablet Oral Daily   pantoprazole   40 mg Oral BID   pravastatin   40 mg Oral Daily   quiNIDine sulfate  200 mg Oral TID   torsemide   20 mg Oral Daily   umeclidinium bromide   1 puff Inhalation Daily   Continuous Infusions:  sodium chloride  50 mL/hr at 05/31/24 0558   PRN Meds: acetaminophen , albuterol , LORazepam , ondansetron  (ZOFRAN ) IV, zolpidem   Allergies:    Allergies  Allergen Reactions   Injectafer [Ferric Carboxymaltose] Shortness Of Breath    Mild SOB following injectafer infusion Mild SOB following injectafer infusion    Penicillins Anaphylaxis and Rash    Has patient had a PCN reaction causing immediate rash, facial/tongue/throat swelling, SOB or lightheadedness with hypotension: Yes Has patient had a PCN reaction causing severe rash involving mucus membranes or skin necrosis: No Has patient had a PCN reaction that required hospitalization: No Has patient had a PCN reaction occurring within the last 10 years: No If all of the above answers are NO, then may proceed with Cephalosporin use. Stuttered with medication     Accupril [Quinapril Hcl] Other (See Comments)    Hyperkalemia=high potassium level   Cozaar  [Losartan  Potassium] Itching   Dml Forte Rash   Entresto [Sacubitril-Valsartan] Other (See Comments)    Hyperkalemia   Flonase  [Fluticasone ] Other (See Comments)    Nasal ulcer.     Lactose Intolerance (Gi) Other (See Comments)    Increased calcium ,  sneezing, nasal issues   Lipitor [Atorvastatin ] Other (See Comments)    muscle aches/pains   Nasonex  [Mometasone  Furoate] Other (See Comments)    nosebleed   Nsaids Other (See Comments)    Contraindication due to ulcers and coughing up blood    Aspirin  Other (See Comments)    Pt states burns  holes in stomach, ulcers    Baking Soda-Fluoride [Sodium Fluoride] Itching and Rash   Chamomile Hives   Clindamycin /Lincomycin Rash   Codeine Nausea And Vomiting   Furosemide  Other (See Comments)    Ears ring, drops bp quickly. Is okay to take smaller doses.    Lanolin-Petrolatum Rash   Miralax  [Polyethylene Glycol] Rash   Quinine Rash   Reclast  [Zoledronic  Acid] Rash and Other (See Comments)    shaking   Rifampin Other (See Comments)    DOES NOT REMEMBER THIS MEDICATION OR ALLERGY   Singulair  [Montelukast ] Other (See Comments)    dizziness   Sulfa Antibiotics Rash   Tramadol  Rash and Other (See Comments)    Anxiety   Vesicare  [Solifenacin ] Other (See Comments)    stopped me from urinating   Wellbutrin  [Bupropion ] Other (See Comments)    Muscle aches, pain, made my skin crawl   Wool Alcohol [Lanolin] Rash   Social History:   Social History   Socioeconomic History   Marital status: Widowed    Spouse name: Not on file   Number of children: 2   Years of education: 14   Highest education level: Associate degree: academic program  Occupational History   Occupation: quality insurcance in Sports coach    Comment: retired  Tobacco Use   Smoking status: Former    Current packs/day: 0.00    Types: Cigarettes    Quit date: 08/24/1966    Years since quitting: 57.8   Smokeless tobacco: Never  Vaping Use   Vaping status: Never Used  Substance and Sexual Activity   Alcohol use: No   Drug use: No   Sexual activity: Not Currently  Other Topics Concern   Not on file  Social History Narrative   Retired. Lives with and helps her brother. Likes Risk manager, Nutritional therapist and  music. Loves animals.   Social Drivers of Health   Financial Resource Strain: Medium Risk (08/10/2023)   Overall Financial Resource Strain (CARDIA)    Difficulty of Paying Living Expenses: Somewhat hard  Food Insecurity: No Food Insecurity (05/28/2024)   Hunger Vital Sign    Worried About Running Out of Food in the Last Year: Never true    Ran Out of Food in the Last Year: Never true  Transportation Needs: No Transportation Needs (05/28/2024)   PRAPARE - Administrator, Civil Service (Medical): No    Lack of Transportation (Non-Medical): No  Physical Activity: Sufficiently Active (08/10/2023)   Exercise Vital Sign    Days of Exercise per Week: 3 days    Minutes of Exercise per Session: 50 min  Recent Concern: Physical Activity - Insufficiently Active (07/12/2023)   Exercise Vital Sign    Days of Exercise per Week: 1 day    Minutes of Exercise per Session: 10 min  Stress: No Stress Concern Present (05/08/2024)   Received from [facility] - Occupational Stress Questionnaire    Do you feel stress - tense, restless, nervous, or anxious, or unable to sleep at night because your mind is troubled all the time - these days?: Not at all  Social Connections: Moderately Integrated (05/28/2024)   Social Connection and Isolation Panel    Frequency of Communication with Friends and Family: More than three times a week    Frequency of Social Gatherings with Friends and Family: More than three times a week    Attends Religious Services: More than 4 times per year  Active Member of Clubs or Organizations: Yes    Attends Banker Meetings: More than 4 times per year    Marital Status: Divorced  Intimate Partner Violence: Not At Risk (05/28/2024)   Humiliation, Afraid, Rape, and Kick questionnaire    Fear of Current or Ex-Partner: No    Emotionally Abused: No    Physically Abused: No    Sexually Abused: No    Family History:     Family History  Problem Relation Age of Onset   Heart disease Mother    Melanoma Mother    Parkinson's disease Mother    Heart disease Father    Heart disease Brother    Heart failure Other    Breast cancer Other    Hypertension Other     ROS:  Review of Systems: [y] = yes, [ ]  = no      General: Weight gain [ ] ; Weight loss [ ] ; Anorexia [ ] ; Fatigue [ ] ; Fever [ ] ; Chills [ ] ; Weakness [ ]    Cardiac: Chest pain/pressure [ ] ; Resting SOB [ ] ; Exertional SOB [ ] ; Orthopnea [ ] ; Pedal Edema [ ] ; Palpitations [ ] ; Syncope [ ] ; Presyncope [ ] ; Paroxysmal nocturnal dyspnea [ ]    Pulmonary: Cough [ ] ; Wheezing [ ] ; Hemoptysis [ ] ; Sputum [ ] ; Snoring [ ]    GI: Vomiting [ ] ; Dysphagia [ ] ; Melena [ ] ; Hematochezia [ ] ; Heartburn [ ] ; Abdominal pain [ ] ; Constipation [ ] ; Diarrhea [ ] ; BRBPR [ ]    GU: Hematuria [ ] ; Dysuria [ ] ; Nocturia [ ]  Vascular: Pain in legs with walking [ ] ; Pain in feet with lying flat [ ] ; Non-healing sores [ ] ; Stroke [ ] ; TIA [ ] ; Slurred speech [ ] ;   Neuro: Headaches [ ] ; Vertigo [ ] ; Seizures [ ] ; Paresthesias [ ] ;Blurred vision [ ] ; Diplopia [ ] ; Vision changes [ ]    Ortho/Skin: Arthritis [ ] ; Joint pain [ ] ; Muscle pain [ ] ; Joint swelling [ ] ; Back Pain [ ] ; Rash [ ]    Psych: Depression [ ] ; Anxiety [ ]    Heme: Bleeding problems [ ] ; Clotting disorders [ ] ; Anemia [ ]    Endocrine: Diabetes [ ] ; Thyroid  dysfunction [ ]    Physical Exam/Data:   Vitals:   05/30/24 1537 05/30/24 1952 05/30/24 2316 05/31/24 0439  BP: (!) 98/56 107/66 (!) 104/57 111/75  Pulse: 89 83 88 89  Resp: 18 18 16 18   Temp: 97.7 F (36.5 C) 97.7 F (36.5 C) 97.8 F (36.6 C) 97.8 F (36.6 C)  TempSrc: Oral Oral Oral Oral  SpO2: 96% 92% 94% 96%  Weight:      Height:       No intake or output data in the 24 hours ending 05/31/24 0632    05/28/2024    3:26 PM 05/28/2024   11:18 AM 05/26/2024   12:22 PM  Last 3 Weights  Weight (lbs) 184 lb 1.6 oz 184 lb 186 lb  Weight (kg)  83.507 kg 83.462 kg 84.369 kg     Body mass index is 34.79 kg/m.  General:  Well nourished, well developed, in no acute distress HEENT: normal Vascular: No carotid bruits; FA pulses 2+ bilaterally without bruits  Cardiac:  normal S1, S2; RRR; no murmur  Lungs:  clear to auscultation bilaterally, no wheezing, rhonchi or rales  Ext: no edema Musculoskeletal:  No deformities, BUE and BLE strength normal and equal Skin: left infraclavicular device without surrounding erythema  or swelling  Psych:  Normal affect   EKG:  The EKG was personally reviewed and demonstrates: APVP 89, PR 120, QRS 164, QT/c 482/586 (similar to prior)  Telemetry:  Telemetry was personally reviewed and demonstrates:  APVP 90s, occasional PVCs and short NSVT  Relevant CV Studies:  TTE Result date: 05/13/24  1. No apical thrombus with Definity  contrast. Left ventricular ejection  fraction, by estimation, is 20 to 25%. Left ventricular ejection fraction  by 2D MOD biplane is 25.1 %. The left ventricle has severely decreased  function. The left ventricle  demonstrates global hypokinesis. The left ventricular internal cavity size  was moderately dilated. Left ventricular diastolic parameters are  consistent with Grade II diastolic dysfunction (pseudonormalization).  Elevated left ventricular end-diastolic  pressure.   2. Right ventricular systolic function is normal. The right ventricular  size is moderately enlarged. There is severely elevated pulmonary artery  systolic pressure. The estimated right ventricular systolic pressure is  64.6 mmHg.   3. Left atrial size was severely dilated.   4. Right atrial size was severely dilated.   5. The mitral valve is degenerative. Mild mitral valve regurgitation.  Moderate mitral annular calcification.   6. The tricuspid valve is abnormal. Tricuspid valve regurgitation is mild  to moderate.   7. The aortic valve is tricuspid. Aortic valve regurgitation is not  visualized.  Aortic valve sclerosis/calcification is present, without any  evidence of aortic stenosis.   8. The inferior vena cava is dilated in size with <50% respiratory  variability, suggesting right atrial pressure of 15 mmHg.   Laboratory Data:  Chemistry Recent Labs  Lab 05/28/24 1135  NA 137  K 4.0  CL 101  CO2 24  GLUCOSE 139*  BUN 19  CREATININE 0.86  CALCIUM  10.7*  GFRNONAA >60  ANIONGAP 11   Hematology Recent Labs  Lab 05/28/24 1135  WBC 7.7  RBC 4.09  HGB 13.3  HCT 40.3  MCV 98.5  MCH 32.5  MCHC 33.0  RDW 13.2  PLT 313   Radiology/Studies:  DG Chest Portable 1 View Result Date: 05/28/2024 CLINICAL DATA:  fatigue, SOB, near syncope EXAM: PORTABLE CHEST 1 VIEW COMPARISON:  May 12, 2024 FINDINGS: The cardiomediastinal silhouette is unchanged and enlarged in contour.LEFT chest cardiac pacing device. No pleural effusion. No pneumothorax. Mildly increased reticular prominence diffusely. Mild peribronchial cuffing. IMPRESSION: Constellation of findings are favored to reflect mild pulmonary edema. Electronically Signed   By: Corean Salter M.D.   On: 05/28/2024 11:58   Assessment and Plan:  Tanya Hogan is a 73 y.o. female with a history of HTN, HLD, DM, LBBB, NICM, OSA w/CPAP, CHB, chronic CHF (systolic), w/CRT-P, PVCs, Abandoned RV lead, and breast Ca (s/p mastectomy) and VT admitted for recurrent VT/VT storm.   VT storm No recurrent sustained VT since transitioning from IV to p.o. amiodarone  with addition of clonidine.  She is tolerating antiarrhythmics without side effects.  Continues on Toprol XL 50 mg daily, Amio 200 mg twice daily and quinidine 200 mg 3 times daily.  Planning for device upgrade to CRT-D with addition of RV D lead to allow for ATP.  Reviewed risk, benefits and alternatives again for ICD upgrade and she is agreeable to proceed.  2. HFrEF/NICM Compensated today, on torsemide  20 mg daily (home dose), toprol XL 50 mg daily.   For questions or  updates, please contact [facility] Please consult [email or web address] for contact info under   Signed, [name], MD  05/31/2024 6:32  AM

## 2024-05-31 NOTE — Progress Notes (Signed)
   05/31/24 2141  BiPAP/CPAP/SIPAP  BiPAP/CPAP/SIPAP Pt Type Adult  BiPAP/CPAP/SIPAP Resmed  Reason BIPAP/CPAP not in use Other(comment) (in use)  Mask Type Full face mask  Dentures removed? Not applicable  Mask Size Medium  Set Rate 0 breaths/min  Respiratory Rate 18 breaths/min  IPAP 12 cmH20  EPAP 6 cmH2O  FiO2 (%) 21 %  Flow Rate 0 lpm  Patient Home Machine No  Patient Home Mask No  Patient Home Tubing No  Auto Titrate No  Nasal massage performed Yes  CPAP/SIPAP surface wiped down Yes  Device Plugged into RED Power Outlet Yes  Oxygen  Percent 21 %

## 2024-06-01 ENCOUNTER — Telehealth (HOSPITAL_COMMUNITY): Payer: Self-pay

## 2024-06-01 ENCOUNTER — Other Ambulatory Visit (HOSPITAL_COMMUNITY): Payer: Self-pay

## 2024-06-01 ENCOUNTER — Telehealth

## 2024-06-01 ENCOUNTER — Inpatient Hospital Stay (HOSPITAL_COMMUNITY)

## 2024-06-01 LAB — GLUCOSE, CAPILLARY
Glucose-Capillary: 111 mg/dL — ABNORMAL HIGH (ref 70–99)
Glucose-Capillary: 102 mg/dL — ABNORMAL HIGH (ref 70–99)
Glucose-Capillary: 114 mg/dL — ABNORMAL HIGH (ref 70–99)
Glucose-Capillary: 96 mg/dL (ref 70–99)
Glucose-Capillary: 121 mg/dL — ABNORMAL HIGH (ref 70–99)

## 2024-06-01 LAB — BASIC METABOLIC PANEL WITH GFR
Anion gap: 13 (ref 5–15)
BUN: 14 mg/dL (ref 8–23)
CO2: 26 mmol/L (ref 22–32)
Calcium: 9.9 mg/dL (ref 8.9–10.3)
Chloride: 101 mmol/L (ref 98–111)
Creatinine, Ser: 0.84 mg/dL (ref 0.44–1.00)
GFR, Estimated: >60 mL/min (ref >60)
Glucose, Bld: 98 mg/dL (ref 70–99)
Potassium: 3.9 mmol/L (ref 3.5–5.1)
Sodium: 140 mmol/L (ref 135–145)

## 2024-06-01 LAB — MAGNESIUM: Magnesium: 1.7 mg/dL (ref 1.7–2.4)

## 2024-06-01 LAB — CBC
HCT: 40.2 % (ref 36.0–46.0)
Hemoglobin: 13 g/dL (ref 12.0–15.0)
MCH: 31.9 pg (ref 26.0–34.0)
MCHC: 32.3 g/dL (ref 30.0–36.0)
MCV: 98.8 fL (ref 80.0–100.0)
Platelets: 280 K/uL (ref 150–400)
RBC: 4.07 MIL/uL (ref 3.87–5.11)
RDW: 13 % (ref 11.5–15.5)
WBC: 5.8 K/uL (ref 4.0–10.5)
nRBC: 0 % (ref 0.0–0.2)

## 2024-06-01 MED ORDER — HYDROCORTISONE 1 % EX CREA: TOPICAL_CREAM | Freq: Two times a day (BID) | CUTANEOUS | Status: DC | PRN

## 2024-06-01 MED ORDER — QUINIDINE GLUCONATE ER 324 MG PO TBCR
324.0000 mg | EXTENDED_RELEASE_TABLET | Freq: Two times a day (BID) | ORAL | 6 refills | Status: DC
  Filled 2024-06-01: qty 60, 30d supply, fill #0

## 2024-06-01 MED ORDER — HYDROCODONE-ACETAMINOPHEN 5-325 MG PO TABS
1.0000 | ORAL_TABLET | Freq: Once | ORAL | Status: AC | PRN
  Administered 2024-06-01: 1 via ORAL
  Filled 2024-06-01: qty 1

## 2024-06-01 MED ORDER — ACETAMINOPHEN 325 MG PO TABS
650.0000 mg | ORAL_TABLET | Freq: Three times a day (TID) | ORAL | Status: DC
  Administered 2024-06-01 – 2024-06-02 (×2): 650 mg via ORAL
  Filled 2024-06-01 (×3): qty 2

## 2024-06-01 NOTE — Progress Notes (Signed)
 Scheduled 975 mg tylenol  given 05/31/24 @ 23:37 for 4/10 incision site pain. Dressing clean, dry, intact.   06/01/2024 00:48 Delgado, MD paged regarding 5/10 incision site pain unresolved w/ scheduled tylenol .   06/01/2024 02:09 Order received for 5 mg Vicodin Once PRN. Pt currently sleeping

## 2024-06-01 NOTE — Telephone Encounter (Signed)
 ERROR

## 2024-06-01 NOTE — Plan of Care (Signed)
  Problem: Education: Goal: Knowledge of General Education information will improve Description: Including pain rating scale, medication(s)/side effects and non-pharmacologic comfort measures Outcome: Progressing   Problem: Health Behavior/Discharge Planning: Goal: Ability to manage health-related needs will improve Outcome: Progressing   Problem: Clinical Measurements: Goal: Ability to maintain clinical measurements within normal limits will improve Outcome: Progressing Goal: Will remain free from infection Outcome: Progressing Goal: Diagnostic test results will improve Outcome: Progressing Goal: Respiratory complications will improve Outcome: Progressing Goal: Cardiovascular complication will be avoided Outcome: Progressing   Problem: Activity: Goal: Risk for activity intolerance will decrease Outcome: Progressing   Problem: Nutrition: Goal: Adequate nutrition will be maintained Outcome: Progressing   Problem: Coping: Goal: Level of anxiety will decrease Outcome: Progressing   Problem: Elimination: Goal: Will not experience complications related to bowel motility Outcome: Progressing Goal: Will not experience complications related to urinary retention Outcome: Progressing   Problem: Pain Managment: Goal: General experience of comfort will improve and/or be controlled Outcome: Progressing   Problem: Safety: Goal: Ability to remain free from injury will improve Outcome: Progressing   Problem: Skin Integrity: Goal: Risk for impaired skin integrity will decrease Outcome: Progressing   Problem: Education: Goal: Ability to describe self-care measures that may prevent or decrease complications (Diabetes Survival Skills Education) will improve Outcome: Progressing Goal: Individualized Educational Video(s) Outcome: Progressing   Problem: Coping: Goal: Ability to adjust to condition or change in health will improve Outcome: Progressing   Problem: Fluid  Volume: Goal: Ability to maintain a balanced intake and output will improve Outcome: Progressing   Problem: Health Behavior/Discharge Planning: Goal: Ability to identify and utilize available resources and services will improve Outcome: Progressing Goal: Ability to manage health-related needs will improve Outcome: Progressing   Problem: Metabolic: Goal: Ability to maintain appropriate glucose levels will improve Outcome: Progressing   Problem: Nutritional: Goal: Maintenance of adequate nutrition will improve Outcome: Progressing Goal: Progress toward achieving an optimal weight will improve Outcome: Progressing   Problem: Skin Integrity: Goal: Risk for impaired skin integrity will decrease Outcome: Progressing   Problem: Tissue Perfusion: Goal: Adequacy of tissue perfusion will improve Outcome: Progressing   Problem: Education: Goal: Ability to manage disease process will improve Outcome: Progressing Goal: Individualized Educational Video(s) Outcome: Progressing   Problem: Cardiac: Goal: Ability to achieve and maintain adequate cardiopulmonary perfusion will improve Outcome: Progressing   Problem: Education: Goal: Knowledge of cardiac device and self-care will improve Outcome: Progressing Goal: Ability to safely manage health related needs after discharge will improve Outcome: Progressing Goal: Individualized Educational Video(s) Outcome: Progressing   Problem: Cardiac: Goal: Ability to achieve and maintain adequate cardiopulmonary perfusion will improve Outcome: Progressing

## 2024-06-01 NOTE — Progress Notes (Signed)
 Mobility Specialist Progress Note;   06/01/24 0954  Mobility  Activity Ambulated with assistance  Level of Assistance Contact guard assist, steadying assist  Assistive Device Other (Comment) (HHA)  Distance Ambulated (ft) 250 ft  Activity Response Tolerated well  Mobility Referral Yes  Mobility visit 1 Mobility  Mobility Specialist Start Time (ACUTE ONLY) 0954  Mobility Specialist Stop Time (ACUTE ONLY) 1004  Mobility Specialist Time Calculation (min) (ACUTE ONLY) 10 min   Pt agreeable to mobility. Required MinG via HHA to safely ambulate d/t sling on ULE. Took 1x standing rest break d/t fatigue, otherwise asx. Pt returned back to chair and left with all needs met. Requested to ambulate again this afternoon, will f/u as able.   Lauraine Erm Mobility Specialist Please contact via SecureChat or [employer] [phone]

## 2024-06-01 NOTE — Care Management Important Message (Signed)
 Important Message  Patient Details  Name: Tanya Hogan MRN: [id number] Date of Birth: 1951/05/30   Important Message Given:  Yes - Medicare IM     Vonzell Arrie Sharps 06/01/2024, 10:30 AM

## 2024-06-01 NOTE — Progress Notes (Signed)
  Patient Name: Tanya Hogan Date of Encounter: 06/01/2024  Primary Cardiologist: Redell Shallow, MD Electrophysiologist: Danelle Birmingham, MD  Interval Summary   Feeling OK this am. Happy procedure was successful.   OK with staying another night while awaiting Quinidine delivery.  Vital Signs    Vitals:   05/31/24 2320 06/01/24 0452 06/01/24 0837 06/01/24 0851  BP: 101/63 (!) 107/58 108/75   Pulse: 88 88 90 90  Resp: 18 16 19 18   Temp: (!) 97.4 F (36.3 C) (!) 97.5 F (36.4 C) 98.4 F (36.9 C)   TempSrc: Axillary Oral Oral   SpO2: 96% 96% 96% 95%  Weight:      Height:       No intake or output data in the 24 hours ending 06/01/24 1013 Filed Weights   05/28/24 1118 05/28/24 1526  Weight: 83.5 kg 83.5 kg    Physical Exam    GEN- NAD, Alert and oriented  Lungs- Clear to ausculation bilaterally, normal work of breathing Cardiac- Regular rate and rhythm, no murmurs, rubs or gallops GI- soft, NT, ND, + BS Extremities- no clubbing or cyanosis. No edema  Telemetry    AV dual paced 90 bpm (personally reviewed)  Hospital Course    BRYSON PALEN is a 73 y.o. female with a history of HTN, HLD, DM, LBBB, NICM, OSA w/CPAP, CHB, chronic CHF (systolic), w/CRT-P, PVCs, Abandoned RV lead, and breast Ca (s/p mastectomy) and VT admitted for recurrent VT/VT storm.   Assessment & Plan    VT Storm Remains quiescent on amio, toprol, and quinidine Continue quinidine 200 mg TID for now. Plan to consolidate to long acting BID on d/c.  S/p upgrade to CRT-D 10/8 by Dr. Almetta.  CXR stable Interrogation stable. Wound care and restrictions with patient  Usual follow up in place  HFrEF NICM Continue torsemide  and GDMT as tolerated.   Dr. Almetta has seen.    For questions or updates, please contact [facility] Please consult [email or web address] for contact info under     Signed, Ozell Prentice Passey, PA-C  06/01/2024, 10:13 AM

## 2024-06-01 NOTE — Discharge Instructions (Signed)
 After Your Lead Revision  Do not lift your arm above shoulder height for 1 week after your procedure. After 7 days, you may progress as below.  You should remove your sling 24 hours after your procedure, unless otherwise instructed by your provider.     Thursday June 08, 2024  Friday June 09, 2024 Saturday June 10, 2024 Sunday June 11, 2024   Do not lift, push, pull, or carry anything over 10 pounds with the affected arm until 6 weeks (Thursday July 13, 2024) after your procedure.   Do not drive until your wound check or until instructed by your healthcare provider that you are safe to do so.   Monitor your surgical site for redness, swelling, and drainage. Call the device clinic at [phone] if you experience these symptoms or fever/chills.  If your incision is sealed with Steri-strips or staples. You may shower 7 days after your procedure and wash your incision with soap and water  as long as it is healed. If your incision is closed with Dermabond/Surgical glue. You may shower 1 day after your pacemaker implant and wash your incision with soap and water . Avoid lotions, ointments, or perfumes over your incision until it is well-healed.  You may use a hot tub or a pool AFTER your wound check appointment if the incision is completely closed.   Your cardiac device may be MRI compatible. We will discuss this at your office visit/Wound check  Remote monitoring is used to monitor your cardiac device from home. This monitoring is scheduled every 91 days by our office. It allows us  to keep an eye on the functioning of your device to ensure it is working properly. You will routinely see your Electrophysiologist annually (more often if necessary).   Implantable Cardiac Device Lead Replacement, Care After This sheet gives you information about how to care for yourself after your procedure. Your health care provider may also give you more specific instructions. If you have problems or  questions, contact your health care provider. What can I expect after the procedure? After your procedure, it is common to have: Mild discomfort at the incision site. A small amount of drainage or bleeding at the incision site. This is usually no more than a spot. Follow these instructions at home: Incision care        Follow instructions from your health care provider about how to take care of your incision. Make sure you: Leave stitches (sutures), skin glue, or adhesive strips in place. These skin closures may need to stay in place for 2 weeks or longer. If adhesive strip edges start to loosen and curl up, you may trim the loose edges. Do not remove adhesive strips completely unless your health care provider tells you to do that. Check your incision area every day for signs of infection. Check for: More redness, swelling, or pain. More fluid or blood. Warmth. Pus or a bad smell. Electric and Engineer, building services cell phones should be kept 12 inches (30 cm) away from the cardiac device when they are on. When talking on a cell phone, use the ear on the opposite side of your cardiac device. Do not place a cell phone in a pocket next to the cardiac device. Household appliances do not interfere with modern-day cardiac device. Medicines Take over-the-counter and prescription medicines only as told by your health care provider. General instructions Do not raise the arm on the side of your procedure higher than your shoulder for at least 7 days. Except  for this restriction, continue to use your arm as normal to prevent problems. Do not take baths, swim, or use a hot tub until your health care provider says it is okay to do so. You may shower as directed by your health care provider. Do not lift anything that is heavier than 10 lb (4.5 kg) for 6 weeks or the limit that your health care provider tells you, until he or she says that it is safe. Return to your normal activities after 2 weeks, or  as told by your health care provider. Ask your health care provider what activities are safe for you. Keep all follow-up visits as told by your health care provider. This is important. Contact a health care provider if: You have more redness, swelling, or pain around your incision. You have more fluid or blood coming from your incision. Your incision feels warm to the touch. You have pus or a bad smell coming from your incision. You have a fever. The arm or hand on the side of the cardiac device becomes swollen. The symptoms you had before your procedure are not getting better. Get help right away if: You develop chest pain. You feel like you will faint. You feel light-headed. You faint. Summary Check your incision area every day for signs of infection, such as more fluid or blood. A small amount of drainage or bleeding at the incision site is normal. Do not raise the arm on the side of your procedure higher than your shoulder for at least 5 days, or as long as directed by your health care provider. Digital cell phones should be kept 12 inches (30 cm) away from the cardiac device when they are on. When talking on a cell phone, use the ear on the opposite side of your cardiac device. If the symptoms that led to having your lead replaced are not getting better, contact your health care provider. This information is not intended to replace advice given to you by your health care provider. Make sure you discuss any questions you have with your health care provider.

## 2024-06-01 NOTE — Telephone Encounter (Signed)
 Called to confirm/remind patient of their appointment [facility] on 06/02/24. However, patient is currently in admitted into the hospital.

## 2024-06-02 ENCOUNTER — Encounter (HOSPITAL_COMMUNITY)

## 2024-06-02 ENCOUNTER — Other Ambulatory Visit (HOSPITAL_COMMUNITY): Payer: Self-pay

## 2024-06-02 ENCOUNTER — Encounter (HOSPITAL_COMMUNITY): Payer: Self-pay | Admitting: Student in an Organized Health Care Education/Training Program

## 2024-06-02 LAB — GLUCOSE, CAPILLARY
Glucose-Capillary: 108 mg/dL — ABNORMAL HIGH (ref 70–99)
Glucose-Capillary: 107 mg/dL — ABNORMAL HIGH (ref 70–99)

## 2024-06-02 MED ORDER — AMIODARONE HCL 200 MG PO TABS
200.0000 mg | ORAL_TABLET | Freq: Two times a day (BID) | ORAL | 6 refills | Status: DC
  Filled 2024-06-02: qty 60, 30d supply, fill #0

## 2024-06-02 NOTE — Discharge Summary (Signed)
 ELECTROPHYSIOLOGY PROCEDURE DISCHARGE SUMMARY    Patient ID: Tanya Hogan,  MRN: [id number], DOB/AGE: 1951/03/11 73 y.o.  Admit date: 05/28/2024 Discharge date: 06/02/2024  Primary Care Physician: Tanya Dorothyann BIRCH, MD  Primary Cardiologist: Tanya Shallow, MD  Electrophysiologist: Dr. Almetta    Primary Diagnosis:  VT Storm HFrEF  Secondary Diagnosis: NICM Constipation  Allergies  Allergen Reactions   Injectafer [Ferric Carboxymaltose] Shortness Of Breath    Mild SOB following injectafer infusion Mild SOB following injectafer infusion    Penicillins Anaphylaxis and Rash    Has patient had a PCN reaction causing immediate rash, facial/tongue/throat swelling, SOB or lightheadedness with hypotension: Yes Has patient had a PCN reaction causing severe rash involving mucus membranes or skin necrosis: No Has patient had a PCN reaction that required hospitalization: No Has patient had a PCN reaction occurring within the last 10 years: No If all of the above answers are NO, then may proceed with Cephalosporin use. Stuttered with medication     Accupril [Quinapril Hcl] Other (See Comments)    Hyperkalemia=high potassium level   Cozaar  [Losartan  Potassium] Itching   Dml Forte Rash   Entresto [Sacubitril-Valsartan] Other (See Comments)    Hyperkalemia   Flonase  [Fluticasone ] Other (See Comments)    Nasal ulcer.     Lactose Intolerance (Gi) Other (See Comments)    Increased calcium , sneezing, nasal issues   Lipitor [Atorvastatin ] Other (See Comments)    muscle aches/pains   Nasonex  [Mometasone  Furoate] Other (See Comments)    nosebleed   Nsaids Other (See Comments)    Contraindication due to ulcers and coughing up blood    Aspirin  Other (See Comments)    Pt states burns holes in stomach, ulcers    Baking Soda-Fluoride [Sodium Fluoride] Itching and Rash   Chamomile Hives   Clindamycin /Lincomycin Rash   Codeine Nausea And Vomiting   Furosemide  Other (See  Comments)    Ears ring, drops bp quickly. Is okay to take smaller doses.    Lanolin-Petrolatum Rash   Miralax  [Polyethylene Glycol] Rash   Quinine Rash   Reclast  [Zoledronic  Acid] Rash and Other (See Comments)    shaking   Rifampin Other (See Comments)    DOES NOT REMEMBER THIS MEDICATION OR ALLERGY   Singulair  [Montelukast ] Other (See Comments)    dizziness   Sulfa Antibiotics Rash   Tramadol  Rash and Other (See Comments)    Anxiety   Vesicare  [Solifenacin ] Other (See Comments)    stopped me from urinating   Wellbutrin  [Bupropion ] Other (See Comments)    Muscle aches, pain, made my skin crawl   Wool Alcohol [Lanolin] Rash     Procedures This Admission:  1.  Upgrade of an existing Medtronic CRT-P to a CRT-D system with addition of an RV lead and capping of the old lead on 10/8 by Dr. Almetta with components as below.    Implant Name Type Inv. Item Serial No. Manufacturer Lot No. LRB No. Used Action  LEAD SPRINT QUAT SEC [id number] - [id number] Lead LEAD SPRINT QUAT SEC [id number] V [employer]  N/A 1 Implanted  ICD COBALT XT CRT DTPA2D4 - [id number] ICD Generator ICD COBALT XT CRT DTPA2D4 [id number] [employer]  N/A 1 Implanted  PPM PRECEPTA MRI CRT-P W1TR01 - [id number] S Pacemaker PACEMAKER PRCT MRI CRTP [id number] S [employer]  Left 1 Explanted  POUCH AIGIS-R ANTIBACT ICD LRG - [id number] Mesh General POUCH AIGIS-R ANTIBACT ICD LRG  [employer] [id number] Left 1 Implanted  KIT LEAD END CAP - [id number] Cap KIT LEAD END CAP  [employer] RHYTHM [employer]NAG[employer] [id number] Left 1 Implanted    There were no post procedure complications  2.  CXR on 06/01/2024 demonstrated no pneumothorax status post device implantation.      Brief HPI: Tanya Hogan is a 73 y.o. female was admitted for recurrent VT storm and consulted by electrophysiology. She had a recent prolonged admission where she was loaded with  amiodarone , and discussed were had with possible transfer for extraction, ultimately felt to be too high risk. Given recurrent VT, risks, benefits, and alternatives to ICD lead implantation were reviewed with the patient who wished to proceed.   Hospital Course:  The patient was admitted and underwent upgrade of a Medtronic CRT-P to a CRT-D.  Given recurrent VT, she was also started on quinidine which she tolerated well. Discharge delay while awaiting for supply from pharmacy. She was monitored on telemetry which demonstrated appropriate pacing and occasional PVCs/NSVT.  Left chest was without hematoma or ecchymosis.  The device was interrogated and found to be functioning normally.  CXR was obtained and demonstrated no pneumothorax status post device implantation..  Wound care, arm mobility, and restrictions were reviewed with the patient.  The patient was examined and considered stable for discharge to home.   The patient's discharge medications include beta blocker (toprol). She has had prior allergy to ACE/ARB and is on hydral/imdur   Anticoagulation resumption This patient is not on anticoagulation.  Physical Exam: Vitals:   06/02/24 0350 06/02/24 0740 06/02/24 1028 06/02/24 1057  BP: 120/76  94/75 115/89  Pulse: 90 88 90   Resp: 20 18 14 16   Temp: 97.6 F (36.4 C)  97.7 F (36.5 C)   TempSrc: Oral  Oral   SpO2: 95% 96% 98% 99%  Weight:      Height:        GEN- NAD. A&O x 3.  HEENT: Normocephalic, atraumatic Lungs- CTAB, normal effort.  Heart- RRR. No M/G/R.  GI- Soft, NT, ND.  Extremities- No clubbing, cyanosis, or edema Skin- Warm and dry, no rash or lesion. ICD site stable  Discharge Medications:  Allergies as of 06/02/2024       Reactions   Injectafer [ferric Carboxymaltose] Shortness Of Breath   Mild SOB following injectafer infusion Mild SOB following injectafer infusion   Penicillins Anaphylaxis, Rash   Has patient had a PCN reaction causing immediate rash,  facial/tongue/throat swelling, SOB or lightheadedness with hypotension: Yes Has patient had a PCN reaction causing severe rash involving mucus membranes or skin necrosis: No Has patient had a PCN reaction that required hospitalization: No Has patient had a PCN reaction occurring within the last 10 years: No If all of the above answers are NO, then may proceed with Cephalosporin use. Stuttered with medication   Accupril [quinapril Hcl] Other (See Comments)   Hyperkalemia=high potassium level   Cozaar  [losartan  Potassium] Itching   Dml Forte Rash   Entresto [sacubitril-valsartan] Other (See Comments)   Hyperkalemia   Flonase  [fluticasone ] Other (See Comments)   Nasal ulcer.     Lactose Intolerance (gi) Other (See Comments)   Increased calcium , sneezing, nasal issues   Lipitor [atorvastatin ] Other (See Comments)   muscle aches/pains   Nasonex  [mometasone  Furoate] Other (See Comments)   nosebleed   Nsaids Other (See Comments)   Contraindication due to ulcers and coughing up blood    Aspirin  Other (See Comments)   Pt states burns holes in stomach, ulcers  Baking Soda-fluoride [sodium Fluoride] Itching, Rash   Chamomile Hives   Clindamycin /lincomycin Rash   Codeine Nausea And Vomiting   Furosemide  Other (See Comments)   Ears ring, drops bp quickly. Is okay to take smaller doses.    Lanolin-petrolatum Rash   Miralax  [polyethylene Glycol] Rash   Quinine Rash   Reclast  [zoledronic  Acid] Rash, Other (See Comments)   shaking   Rifampin Other (See Comments)   DOES NOT REMEMBER THIS MEDICATION OR ALLERGY   Singulair  [montelukast ] Other (See Comments)   dizziness   Sulfa Antibiotics Rash   Tramadol  Rash, Other (See Comments)   Anxiety   Vesicare  [solifenacin ] Other (See Comments)   stopped me from urinating   Wellbutrin  [bupropion ] Other (See Comments)   Muscle aches, pain, made my skin crawl   Wool Alcohol [lanolin] Rash        Medication List     STOP taking  these medications    mexiletine 150 MG capsule Commonly known as: MEXITIL        TAKE these medications    acetaminophen  325 MG tablet Commonly known as: TYLENOL  Take 325 mg by mouth every 6 (six) hours as needed for mild pain (pain score 1-3).   albuterol  (2.5 MG/3ML) 0.083% nebulizer solution Commonly known as: PROVENTIL  Take 2.5 mg by nebulization every 6 (six) hours as needed for wheezing or shortness of breath.   alendronate  70 MG tablet Commonly known as: FOSAMAX  TAKE 1 TABLET BY MOUTH EVERY 7  DAYS WITH FULL GLASS OF WATER  ON AN EMPTY STOMACH   amiodarone  200 MG tablet Commonly known as: PACERONE  Take 1 tablet (200 mg total) by mouth 2 (two) times daily. What changed: Another medication with the same name was removed. Continue taking this medication, and follow the directions you see here.   aspirin  81 MG chewable tablet Chew 81 mg by mouth daily.   Calcium  600+D Plus Minerals 600-400 MG-UNIT Tabs 1 tab p.o. twice daily What changed:  how much to take how to take this when to take this additional instructions   EPINEPHrine  0.3 mg/0.3 mL Soaj injection Commonly known as: EPI-PEN Inject 0.3 mg into the muscle as needed for anaphylaxis.   estradiol 0.1 MG/GM vaginal cream Commonly known as: ESTRACE Place 1 Applicatorful vaginally 3 (three) times a week. What changed: when to take this   fexofenadine  180 MG tablet Commonly known as: ALLEGRA  Take 180 mg by mouth daily.   FreeStyle Libre 3 Reader [employer] Use for continuous glucose monitoring   FreeStyle Libre 3 Sensor Misc by Does not apply route. Change every 14 days   Gemtesa 75 MG Tabs Generic drug: Vibegron Take 75 mg by mouth daily.   glucose blood test strip To be used to check blood glucose Dx:E11.8 FreeStyle Precision blood glucose for Freestyle Libre 2   isosorbide  mononitrate 30 MG 24 hr tablet Commonly known as: IMDUR  TAKE 1 TABLET BY MOUTH DAILY   levothyroxine  112 MCG tablet Commonly  known as: SYNTHROID  TAKE 1 TABLET BY MOUTH DAILY   lubiprostone 24 MCG capsule Commonly known as: AMITIZA Take 24 mcg by mouth 2 (two) times daily with a meal. Patient reports provider has decreased to every three days. What changed:  when to take this additional instructions   metFORMIN  500 MG 24 hr tablet Commonly known as: GLUCOPHAGE -XR TAKE 1 TABLET BY MOUTH TWICE  DAILY WITH MEALS   metoprolol succinate 50 MG 24 hr tablet Commonly known as: TOPROL-XL Take 1 tablet (50 mg total) by mouth 2 (  two) times daily. Take with or immediately following a meal.   Ocuvite Eye + Multi Tabs Take 1 tablet by mouth in the morning. What changed: Another medication with the same name was changed. Make sure you understand how and when to take each.   MULTIPLE VITAMINS/WOMENS PO Take 1 tablet by mouth daily. What changed: additional instructions   pantoprazole  40 MG tablet Commonly known as: PROTONIX  TAKE 1 TABLET BY MOUTH DAILY  BEFORE BREAKFAST What changed: when to take this   pravastatin  40 MG tablet Commonly known as: PRAVACHOL  Take 40 mg by mouth daily.   quiniDINE gluconate 324 MG CR tablet Take 1 tablet (324 mg total) by mouth 2 (two) times daily.   Spiriva  Respimat 1.25 MCG/ACT Aers Generic drug: Tiotropium Bromide  Inhale 1 puff into the lungs daily.   torsemide  20 MG tablet Commonly known as: DEMADEX  TAKE 1 TABLET BY MOUTH DAILY   valACYclovir  1000 MG tablet Commonly known as: VALTREX  Take 1 tablet (1,000 mg total) by mouth 2 (two) times daily. What changed:  when to take this reasons to take this   Wixela Inhub  250-50 MCG/ACT Aepb Generic drug: fluticasone -salmeterol Inhale 1 puff into the lungs in the morning and at bedtime.        Disposition: Home with usual follow up as in AVS  Duration of Discharge Encounter:  APP time: 32 minutes  Signed, Ozell Prentice Passey, PA-C  06/02/2024 1:38 PM

## 2024-06-02 NOTE — Plan of Care (Signed)
  Problem: Education: Goal: Knowledge of General Education information will improve Description: Including pain rating scale, medication(s)/side effects and non-pharmacologic comfort measures Outcome: Progressing   Problem: Clinical Measurements: Goal: Ability to maintain clinical measurements within normal limits will improve Outcome: Progressing Goal: Will remain free from infection Outcome: Progressing Goal: Cardiovascular complication will be avoided Outcome: Progressing   Problem: Activity: Goal: Risk for activity intolerance will decrease Outcome: Progressing   

## 2024-06-02 NOTE — Progress Notes (Signed)
 Mobility Specialist Progress Note;   06/02/24 1113  Mobility  Activity Ambulated with assistance  Level of Assistance Standby assist, set-up cues, supervision of patient - no hands on  Assistive Device Front wheel Priestly  Distance Ambulated (ft) 400 ft  Activity Response Tolerated well  Mobility Referral Yes  Mobility visit 1 Mobility  Mobility Specialist Start Time (ACUTE ONLY) 1113  Mobility Specialist Stop Time (ACUTE ONLY) 1126  Mobility Specialist Time Calculation (min) (ACUTE ONLY) 13 min   Pt agreeable to mobility. Required no physical assistance during ambulation, SV for safety. VSS throughout. No c/o dizziness when asked, said she had an episode of it earlier but it has resolved. Pt able to increase in distance ambulated this session. Pt returned back to chair with all needs met.   Lauraine Erm Mobility Specialist Please contact via SecureChat or [employer] [phone]

## 2024-06-02 NOTE — Progress Notes (Signed)
  Telemetry remains stable.    No new complaints  Continues to have intermittent constipation  Home today once quinidine arrives to TOC.   Ozell [name], PA-C  06/02/2024 8:19 AM

## 2024-06-05 ENCOUNTER — Telehealth: Payer: Self-pay

## 2024-06-05 ENCOUNTER — Telehealth: Payer: Self-pay | Admitting: Cardiothoracic Surgery

## 2024-06-05 ENCOUNTER — Encounter (HOSPITAL_COMMUNITY): Payer: Self-pay | Admitting: Internal Medicine

## 2024-06-05 ENCOUNTER — Encounter: Payer: Self-pay | Admitting: Student in an Organized Health Care Education/Training Program

## 2024-06-05 ENCOUNTER — Observation Stay (HOSPITAL_COMMUNITY)
Admission: EM | Admit: 2024-06-05 | Discharge: 2024-06-07 | Disposition: A | Discharge status: Home / Self Care | Attending: Internal Medicine | Admitting: Internal Medicine

## 2024-06-05 ENCOUNTER — Emergency Department (HOSPITAL_COMMUNITY)

## 2024-06-05 ENCOUNTER — Other Ambulatory Visit: Payer: Self-pay

## 2024-06-05 ENCOUNTER — Telehealth: Payer: Self-pay | Admitting: Student in an Organized Health Care Education/Training Program

## 2024-06-05 DIAGNOSIS — Z794 Long term (current) use of insulin: Secondary | ICD-10-CM | POA: Insufficient documentation

## 2024-06-05 DIAGNOSIS — J45909 Unspecified asthma, uncomplicated: Secondary | ICD-10-CM | POA: Insufficient documentation

## 2024-06-05 DIAGNOSIS — G4733 Obstructive sleep apnea (adult) (pediatric): Secondary | ICD-10-CM | POA: Diagnosis not present

## 2024-06-05 DIAGNOSIS — E66812 Obesity, class 2: Secondary | ICD-10-CM | POA: Diagnosis not present

## 2024-06-05 DIAGNOSIS — R002 Palpitations: Secondary | ICD-10-CM | POA: Diagnosis present

## 2024-06-05 DIAGNOSIS — Z79899 Other long term (current) drug therapy: Secondary | ICD-10-CM | POA: Diagnosis not present

## 2024-06-05 DIAGNOSIS — I472 Ventricular tachycardia, unspecified: Principal | ICD-10-CM | POA: Diagnosis present

## 2024-06-05 DIAGNOSIS — E039 Hypothyroidism, unspecified: Secondary | ICD-10-CM | POA: Diagnosis present

## 2024-06-05 DIAGNOSIS — I471 Supraventricular tachycardia, unspecified: Principal | ICD-10-CM | POA: Insufficient documentation

## 2024-06-05 DIAGNOSIS — K580 Irritable bowel syndrome with diarrhea: Secondary | ICD-10-CM | POA: Insufficient documentation

## 2024-06-05 DIAGNOSIS — I428 Other cardiomyopathies: Secondary | ICD-10-CM | POA: Diagnosis not present

## 2024-06-05 DIAGNOSIS — R079 Chest pain, unspecified: Secondary | ICD-10-CM | POA: Diagnosis not present

## 2024-06-05 DIAGNOSIS — K7581 Nonalcoholic steatohepatitis (NASH): Secondary | ICD-10-CM | POA: Diagnosis present

## 2024-06-05 DIAGNOSIS — E1169 Type 2 diabetes mellitus with other specified complication: Secondary | ICD-10-CM | POA: Diagnosis present

## 2024-06-05 DIAGNOSIS — E119 Type 2 diabetes mellitus without complications: Secondary | ICD-10-CM | POA: Insufficient documentation

## 2024-06-05 DIAGNOSIS — I5022 Chronic systolic (congestive) heart failure: Secondary | ICD-10-CM | POA: Diagnosis not present

## 2024-06-05 DIAGNOSIS — Z6836 Body mass index (BMI) 36.0-36.9, adult: Secondary | ICD-10-CM | POA: Insufficient documentation

## 2024-06-05 DIAGNOSIS — I10 Essential (primary) hypertension: Secondary | ICD-10-CM | POA: Diagnosis present

## 2024-06-05 DIAGNOSIS — Z95810 Presence of automatic (implantable) cardiac defibrillator: Secondary | ICD-10-CM | POA: Diagnosis not present

## 2024-06-05 DIAGNOSIS — Z91040 Latex allergy status: Secondary | ICD-10-CM | POA: Insufficient documentation

## 2024-06-05 DIAGNOSIS — Z7982 Long term (current) use of aspirin: Secondary | ICD-10-CM | POA: Diagnosis not present

## 2024-06-05 DIAGNOSIS — E876 Hypokalemia: Secondary | ICD-10-CM

## 2024-06-05 DIAGNOSIS — I110 Hypertensive heart disease with heart failure: Secondary | ICD-10-CM | POA: Insufficient documentation

## 2024-06-05 DIAGNOSIS — J449 Chronic obstructive pulmonary disease, unspecified: Secondary | ICD-10-CM | POA: Diagnosis not present

## 2024-06-05 LAB — CBC WITH DIFFERENTIAL/PLATELET
Abs Immature Granulocytes: 0.03 K/uL (ref 0.00–0.07)
Basophils Absolute: 0.1 K/uL (ref 0.0–0.1)
Basophils Relative: 1 %
Eosinophils Absolute: 0 K/uL (ref 0.0–0.5)
Eosinophils Relative: 1 %
HCT: 40.1 % (ref 36.0–46.0)
Hemoglobin: 13 g/dL (ref 12.0–15.0)
Immature Granulocytes: 1 %
Lymphocytes Relative: 21 %
Lymphs Abs: 1.4 K/uL (ref 0.7–4.0)
MCH: 32 pg (ref 26.0–34.0)
MCHC: 32.4 g/dL (ref 30.0–36.0)
MCV: 98.8 fL (ref 80.0–100.0)
Monocytes Absolute: 0.5 K/uL (ref 0.1–1.0)
Monocytes Relative: 8 %
Neutro Abs: 4.6 K/uL (ref 1.7–7.7)
Neutrophils Relative %: 68 %
Platelets: 178 K/uL (ref 150–400)
RBC: 4.06 MIL/uL (ref 3.87–5.11)
RDW: 12.6 % (ref 11.5–15.5)
Smear Review: NORMAL
WBC: 6.6 K/uL (ref 4.0–10.5)
nRBC: 0 % (ref 0.0–0.2)

## 2024-06-05 LAB — COMPREHENSIVE METABOLIC PANEL WITH GFR
ALT: 28 U/L (ref 0–44)
AST: 41 U/L (ref 15–41)
Albumin: 3.3 g/dL — ABNORMAL LOW (ref 3.5–5.0)
Alkaline Phosphatase: 99 U/L (ref 38–126)
Anion gap: 18 — ABNORMAL HIGH (ref 5–15)
BUN: 24 mg/dL — ABNORMAL HIGH (ref 8–23)
CO2: 17 mmol/L — ABNORMAL LOW (ref 22–32)
Calcium: 9.8 mg/dL (ref 8.9–10.3)
Chloride: 102 mmol/L (ref 98–111)
Creatinine, Ser: 1.01 mg/dL — ABNORMAL HIGH (ref 0.44–1.00)
GFR, Estimated: 58 mL/min — ABNORMAL LOW (ref >60)
Glucose, Bld: 112 mg/dL — ABNORMAL HIGH (ref 70–99)
Potassium: 3.5 mmol/L (ref 3.5–5.1)
Sodium: 137 mmol/L (ref 135–145)
Total Bilirubin: 0.6 mg/dL (ref 0.0–1.2)
Total Protein: 6.7 g/dL (ref 6.5–8.1)

## 2024-06-05 LAB — CBG MONITORING, ED
Glucose-Capillary: 95 mg/dL (ref 70–99)
Glucose-Capillary: 102 mg/dL — ABNORMAL HIGH (ref 70–99)

## 2024-06-05 LAB — HEMOGLOBIN A1C
Hgb A1c MFr Bld: 5.5 % (ref 4.8–5.6)
Mean Plasma Glucose: 111.15 mg/dL

## 2024-06-05 LAB — MAGNESIUM: Magnesium: 1.7 mg/dL (ref 1.7–2.4)

## 2024-06-05 LAB — BRAIN NATRIURETIC PEPTIDE: B Natriuretic Peptide: 1051.8 pg/mL — ABNORMAL HIGH (ref 0.0–100.0)

## 2024-06-05 LAB — TROPONIN I (HIGH SENSITIVITY)
Troponin I (High Sensitivity): 20 ng/L — ABNORMAL HIGH (ref <18)
Troponin I (High Sensitivity): 25 ng/L — ABNORMAL HIGH (ref <18)

## 2024-06-05 LAB — TSH: TSH: 4.533 u[IU]/mL — ABNORMAL HIGH (ref 0.350–4.500)

## 2024-06-05 MED ORDER — ASPIRIN 81 MG PO CHEW
81.0000 mg | CHEWABLE_TABLET | Freq: Every day | ORAL | Status: DC
  Administered 2024-06-06 – 2024-06-07 (×2): 81 mg via ORAL
  Filled 2024-06-05 (×2): qty 1

## 2024-06-05 MED ORDER — ACETAMINOPHEN 325 MG PO TABS: 650.0000 mg | ORAL_TABLET | Freq: Four times a day (QID) | ORAL | Status: DC | PRN

## 2024-06-05 MED ORDER — SODIUM CHLORIDE 0.9% FLUSH: 3.0000 mL | INTRAVENOUS | Status: DC | PRN

## 2024-06-05 MED ORDER — QUINIDINE GLUCONATE ER 324 MG PO TBCR: 324.0000 mg | EXTENDED_RELEASE_TABLET | Freq: Two times a day (BID) | ORAL | Status: DC

## 2024-06-05 MED ORDER — AMIODARONE HCL 200 MG PO TABS
200.0000 mg | ORAL_TABLET | Freq: Two times a day (BID) | ORAL | Status: DC
  Administered 2024-06-05 – 2024-06-07 (×5): 200 mg via ORAL
  Filled 2024-06-05 (×5): qty 1

## 2024-06-05 MED ORDER — QUINIDINE SULFATE 200 MG PO TABS
200.0000 mg | ORAL_TABLET | Freq: Three times a day (TID) | ORAL | Status: DC
Start: 2024-06-05 — End: 2024-06-07
  Administered 2024-06-05 – 2024-06-07 (×5): 200 mg via ORAL
  Filled 2024-06-05 (×10): qty 1

## 2024-06-05 MED ORDER — ENOXAPARIN SODIUM 40 MG/0.4ML IJ SOSY
40.0000 mg | PREFILLED_SYRINGE | INTRAMUSCULAR | Status: DC
  Administered 2024-06-05: 40 mg via SUBCUTANEOUS
  Filled 2024-06-05 (×2): qty 0.4

## 2024-06-05 MED ORDER — ONDANSETRON HCL 4 MG/2ML IJ SOLN: 4.0000 mg | Freq: Four times a day (QID) | INTRAMUSCULAR | Status: DC | PRN

## 2024-06-05 MED ORDER — POTASSIUM CHLORIDE 10 MEQ/100ML IV SOLN
10.0000 meq | INTRAVENOUS | Status: AC
  Administered 2024-06-05 (×3): 10 meq via INTRAVENOUS
  Filled 2024-06-05 (×3): qty 100

## 2024-06-05 MED ORDER — ONDANSETRON HCL 4 MG PO TABS: 4.0000 mg | ORAL_TABLET | Freq: Four times a day (QID) | ORAL | Status: DC | PRN

## 2024-06-05 MED ORDER — OYSTER SHELL CALCIUM/D3 500-5 MG-MCG PO TABS
2.0000 | ORAL_TABLET | Freq: Every day | ORAL | Status: DC
  Administered 2024-06-05 – 2024-06-06 (×2): 2 via ORAL
  Filled 2024-06-05 (×3): qty 2

## 2024-06-05 MED ORDER — HYDROCODONE-ACETAMINOPHEN 5-325 MG PO TABS: 1.0000 | ORAL_TABLET | ORAL | Status: DC | PRN

## 2024-06-05 MED ORDER — LUBIPROSTONE 24 MCG PO CAPS
24.0000 ug | ORAL_CAPSULE | Freq: Every day | ORAL | Status: DC
  Administered 2024-06-06 – 2024-06-07 (×2): 24 ug via ORAL
  Filled 2024-06-05 (×3): qty 1

## 2024-06-05 MED ORDER — VALACYCLOVIR HCL 500 MG PO TABS: 1000.0000 mg | ORAL_TABLET | Freq: Every day | ORAL | Status: DC | PRN

## 2024-06-05 MED ORDER — ONDANSETRON HCL 4 MG/2ML IJ SOLN
4.0000 mg | Freq: Once | INTRAMUSCULAR | Status: AC
  Administered 2024-06-05: 4 mg via INTRAVENOUS
  Filled 2024-06-05: qty 2

## 2024-06-05 MED ORDER — TIOTROPIUM BROMIDE 1.25 MCG/ACT IN AERS: 1.0000 | INHALATION_SPRAY | Freq: Every day | RESPIRATORY_TRACT | Status: DC

## 2024-06-05 MED ORDER — MAGNESIUM OXIDE -MG SUPPLEMENT 400 (240 MG) MG PO TABS
400.0000 mg | ORAL_TABLET | Freq: Two times a day (BID) | ORAL | Status: DC
  Administered 2024-06-05 – 2024-06-07 (×5): 400 mg via ORAL
  Filled 2024-06-05 (×5): qty 1

## 2024-06-05 MED ORDER — ADULT MULTIVITAMIN W/MINERALS CH
1.0000 | ORAL_TABLET | Freq: Every day | ORAL | Status: DC
  Administered 2024-06-06 – 2024-06-07 (×2): 1 via ORAL
  Filled 2024-06-05 (×2): qty 1

## 2024-06-05 MED ORDER — EPINEPHRINE 0.3 MG/0.3ML IJ SOAJ: 0.3000 mg | INTRAMUSCULAR | Status: DC | PRN

## 2024-06-05 MED ORDER — POLYETHYLENE GLYCOL 3350 17 G PO PACK: 17.0000 g | PACK | Freq: Every day | ORAL | Status: DC | PRN

## 2024-06-05 MED ORDER — ACETAMINOPHEN 650 MG RE SUPP: 650.0000 mg | Freq: Four times a day (QID) | RECTAL | Status: DC | PRN

## 2024-06-05 MED ORDER — FLUTICASONE FUROATE-VILANTEROL 200-25 MCG/ACT IN AEPB
1.0000 | INHALATION_SPRAY | Freq: Every day | RESPIRATORY_TRACT | Status: DC
  Filled 2024-06-05: qty 28

## 2024-06-05 MED ORDER — PRAVASTATIN SODIUM 40 MG PO TABS
40.0000 mg | ORAL_TABLET | Freq: Every day | ORAL | Status: DC
  Administered 2024-06-06 – 2024-06-07 (×2): 40 mg via ORAL
  Filled 2024-06-05 (×2): qty 1

## 2024-06-05 MED ORDER — PANTOPRAZOLE SODIUM 40 MG PO TBEC
40.0000 mg | DELAYED_RELEASE_TABLET | Freq: Two times a day (BID) | ORAL | Status: DC
  Administered 2024-06-05 – 2024-06-07 (×4): 40 mg via ORAL
  Filled 2024-06-05 (×4): qty 1

## 2024-06-05 MED ORDER — LORATADINE 10 MG PO TABS
10.0000 mg | ORAL_TABLET | Freq: Every day | ORAL | Status: DC
  Administered 2024-06-06 – 2024-06-07 (×2): 10 mg via ORAL
  Filled 2024-06-05 (×2): qty 1

## 2024-06-05 MED ORDER — ALUM & MAG HYDROXIDE-SIMETH 200-200-20 MG/5ML PO SUSP
30.0000 mL | Freq: Four times a day (QID) | ORAL | Status: DC | PRN
  Administered 2024-06-05 – 2024-06-07 (×2): 30 mL via ORAL
  Filled 2024-06-05 (×2): qty 30

## 2024-06-05 MED ORDER — METOPROLOL SUCCINATE ER 50 MG PO TB24
50.0000 mg | ORAL_TABLET | Freq: Two times a day (BID) | ORAL | Status: DC
  Administered 2024-06-06 – 2024-06-07 (×2): 50 mg via ORAL
  Filled 2024-06-05: qty 1
  Filled 2024-06-05: qty 2
  Filled 2024-06-05: qty 1

## 2024-06-05 MED ORDER — POTASSIUM CHLORIDE 10 MEQ/100ML IV SOLN
10.0000 meq | Freq: Once | INTRAVENOUS | Status: AC
  Administered 2024-06-05: 10 meq via INTRAVENOUS
  Filled 2024-06-05: qty 100

## 2024-06-05 MED ORDER — SODIUM CHLORIDE 0.9% FLUSH
3.0000 mL | Freq: Two times a day (BID) | INTRAVENOUS | Status: DC
  Administered 2024-06-05 – 2024-06-07 (×4): 3 mL via INTRAVENOUS

## 2024-06-05 MED ORDER — POTASSIUM CHLORIDE CRYS ER 20 MEQ PO TBCR
40.0000 meq | EXTENDED_RELEASE_TABLET | Freq: Once | ORAL | Status: AC
  Administered 2024-06-05: 40 meq via ORAL
  Filled 2024-06-05: qty 2

## 2024-06-05 MED ORDER — MAGNESIUM SULFATE 2 GM/50ML IV SOLN
2.0000 g | Freq: Once | INTRAVENOUS | Status: AC
  Administered 2024-06-05: 2 g via INTRAVENOUS
  Filled 2024-06-05: qty 50

## 2024-06-05 MED ORDER — ALBUTEROL SULFATE (2.5 MG/3ML) 0.083% IN NEBU
2.5000 mg | INHALATION_SOLUTION | Freq: Four times a day (QID) | RESPIRATORY_TRACT | Status: DC | PRN
Start: 2024-06-05 — End: 2024-06-07

## 2024-06-05 MED ORDER — INSULIN ASPART 100 UNIT/ML IJ SOLN: 0.0000 [IU] | Freq: Three times a day (TID) | INTRAMUSCULAR | Status: DC

## 2024-06-05 MED ORDER — INSULIN ASPART 100 UNIT/ML IJ SOLN: 0.0000 [IU] | Freq: Every day | INTRAMUSCULAR | Status: DC

## 2024-06-05 MED ORDER — TORSEMIDE 20 MG PO TABS
20.0000 mg | ORAL_TABLET | Freq: Every day | ORAL | Status: DC
  Administered 2024-06-06 – 2024-06-07 (×2): 20 mg via ORAL
  Filled 2024-06-05 (×2): qty 1

## 2024-06-05 MED ORDER — SODIUM CHLORIDE 0.9 % IV SOLN: 250.0000 mL | INTRAVENOUS | Status: AC | PRN

## 2024-06-05 MED ORDER — UMECLIDINIUM BROMIDE 62.5 MCG/ACT IN AEPB
1.0000 | INHALATION_SPRAY | Freq: Every day | RESPIRATORY_TRACT | Status: DC
Start: 2024-06-05 — End: 2024-06-06

## 2024-06-05 MED ORDER — LEVOTHYROXINE SODIUM 112 MCG PO TABS
112.0000 ug | ORAL_TABLET | Freq: Every day | ORAL | Status: DC
  Administered 2024-06-06 – 2024-06-07 (×2): 112 ug via ORAL
  Filled 2024-06-05 (×2): qty 1

## 2024-06-05 MED ORDER — ISOSORBIDE MONONITRATE ER 30 MG PO TB24
30.0000 mg | ORAL_TABLET | Freq: Every day | ORAL | Status: DC
  Administered 2024-06-06 – 2024-06-07 (×2): 30 mg via ORAL
  Filled 2024-06-05 (×2): qty 1

## 2024-06-05 NOTE — Progress Notes (Unsigned)
 Patient going to ED for evaluation after multiple VT episodes with successful ATP. Discussed with [name]. Would check electrolytes and switch from quinidine gluconate 324 mg bid back to quinidine sulfate 200 mg tid. MMVT 130-150s. She is end stage NICM/HFrEF. Right now would do everything possible to optimize medical tx first. Not a great candidate for ablation with multiple comorbidities and very limited baseline functional status.   Tanya DELENA Primus, MD [facility]  Cardiac Electrophysiology

## 2024-06-05 NOTE — H&P (Addendum)
 [facility] History and Physical  Tanya Hogan FMW:[id number] DOB: 1951/02/13 DOA: 06/05/2024  Referring physician: ED  PCP: [name], MD   Patient is coming from: Home  Chief Complaint: Palpitations  HPI: Patient is a 73 years old female with past medical history of breast cancer, chronic heart failure, nonalcoholic cirrhosis, depression, diabetes mellitus type 2, GERD, hypertension, hypothyroidism, valvular heart disease, pulmonary hypertension, sleep apnea and ventricular tachyarrhythmia presented to hospital with complaints of palpitation.  Of note patient had recently undergone revision of pacemaker/AICD as outpatient and then noted that her heart was racing so she seeked medical attention.   Patient denies any shortness of breath, dyspnea, chest pain or syncope.  Denies any vomiting but has some nausea.  Denies any abdominal pain diarrhea or constipation.  Denies any urinary urgency, frequency or dysuria.  Denies any fever chills or rigor.    In the ED, patient was noted to have stable vitals   Afebrile.  Labs were notable for TSH slightly elevated.  WBC within normal range.  BNP at 1051.  Magnesium  1.7 with mild potassium of 3.5.  Patient was then considered for admission to the hospital for further evaluation and treatment.  Assessment and Plan Principal Problem:   Ventricular tachycardia (HCC)  Recurrent ventricular tachycardia.   Status post recent revision of pacemaker/AICD.  Seen by electrophysiology cardiology.  No further intervention except resuming current medication and will consult palliative care.  Continue amiodarone  and beta-blocker from home.  Follow cardiology recommendation.  Patient has been considered not to be a good candidate for invasive electrophysiology intervention.  Borderline hypokalemia.  Will replace.  Will continue to replace magnesium  as well.  Check levels in AM.  History of refractory chronic heart failure, valvular heart  disease, pulmonary hypertension with recurrent admissions.  Cardiology has recommended palliative care evaluation.  Continue metoprolol, Pravachol , torsemide .  Not much option available for her treatment.  Hypothyroidism. Continue Synthroid .  History of diabetes mellitus type 2 Hold metformin .  Continue sliding scale insulin .  Will continue to monitor  GERD Continue Protonix .  Hypertension Continue metoprolol.   DVT Prophylaxis: Lovenox  subcu  Review of Systems:  All systems were reviewed and were negative unless otherwise mentioned in the HPI   Past Medical History:  Diagnosis Date   Alkaline phosphatase elevation    Asthma    Breast cancer (HCC) 2011   Carpal tunnel syndrome    bilateral   Chronic HFrEF (heart failure with reduced ejection fraction) (HCC)    Cirrhosis, non-alcoholic (HCC)    Closed fracture of unspecified part of radius (alone)    DDD (degenerative disc disease), lumbar    Depression    pt denies   Diabetes mellitus without complication (HCC)    Enlarged heart    Fibromyalgia    GERD (gastroesophageal reflux disease)    History of kidney stones    HTN (hypertension)    Hypothyroidism    IBS (irritable bowel syndrome)    LBBB (left bundle branch block)    Microcytic anemia 05/14/2017   Mitral regurgitation    NICM (nonischemic cardiomyopathy) (HCC)    Pneumonia    Presence of permanent cardiac pacemaker    Pulmonary hypertension (HCC)    PVD (peripheral vascular disease)    2019   Sleep apnea    uses cpap   Tricuspid regurgitation    Ventricular tachyarrhythmia [facility])    Past Surgical History:  Procedure Laterality Date   BIV ICD GENERATOR CHANGEOUT N/A 05/31/2024  Procedure: BIV ICD GENERATOR CHANGEOUT;  Surgeon: Almetta Donnice LABOR, MD;  Location: [facility] INVASIVE CV LAB;  Service: Cardiovascular;  Laterality: N/A;   BIV PACEMAKER GENERATOR CHANGEOUT N/A 11/03/2021   Procedure: BIV PACEMAKER GENERATOR CHANGEOUT;  Surgeon: Waddell Danelle ORN, MD;   Location: [facility] INVASIVE CV LAB;  Service: Cardiovascular;  Laterality: N/A;   BREAST LUMPECTOMY Left 2011   COLONOSCOPY     FOOT SURGERY     KNEE SURGERY     LEAD INSERTION N/A 05/31/2024   Procedure: LEAD INSERTION;  Surgeon: Almetta Donnice LABOR, MD;  Location: [facility] INVASIVE CV LAB;  Service: Cardiovascular;  Laterality: N/A;   LEAD REVISION/REPAIR N/A 11/03/2021   Procedure: LEAD REVISION/REPAIR;  Surgeon: Waddell Danelle ORN, MD;  Location: [facility] INVASIVE CV LAB;  Service: Cardiovascular;  Laterality: N/A;   MASTECTOMY Right    05/2019   PACEMAKER GENERATOR CHANGE N/A 08/23/2014   Procedure: PACEMAKER GENERATOR CHANGE;  Surgeon: Danelle ORN Waddell, MD;  Location: [facility] CATH LAB;  Service: Cardiovascular;  Laterality: N/A;   PACEMAKER PLACEMENT  2009   [place] [facility]   RECTAL SURGERY  1976   REDUCTION MAMMAPLASTY Left    05/2019   RIGHT/LEFT HEART CATH AND CORONARY ANGIOGRAPHY N/A 05/18/2024   Procedure: RIGHT/LEFT HEART CATH AND CORONARY ANGIOGRAPHY;  Surgeon: Wendel Lurena POUR, MD;  Location: [facility] INVASIVE CV LAB;  Service: Cardiovascular;  Laterality: N/A;   TOTAL SHOULDER ARTHROPLASTY Right 03/10/2018   TOTAL SHOULDER ARTHROPLASTY Right 03/10/2018   Procedure: RIGHT TOTAL SHOULDER ARTHROPLASTY;  Surgeon: Dozier Soulier, MD;  Location: [facility] OR;  Service: Orthopedics;  Laterality: Right;   TUBAL LIGATION     UPPER EXTREMITY VENOGRAPHY  05/18/2024   Procedure: UPPER EXTREMITY VENOGRAPHY;  Surgeon: Wendel Lurena POUR, MD;  Location: [facility] INVASIVE CV LAB;  Service: Cardiovascular;;    Social History:  reports that she quit smoking about 57 years ago. Her smoking use included cigarettes. She has never used smokeless tobacco. She reports that she does not drink alcohol and does not use drugs.  Allergies  Allergen Reactions   Injectafer [Ferric Carboxymaltose] Shortness Of Breath    Mild SOB following injectafer infusion Mild SOB following injectafer infusion    Penicillins Anaphylaxis and Rash    Has  patient had a PCN reaction causing immediate rash, facial/tongue/throat swelling, SOB or lightheadedness with hypotension: Yes Has patient had a PCN reaction causing severe rash involving mucus membranes or skin necrosis: No Has patient had a PCN reaction that required hospitalization: No Has patient had a PCN reaction occurring within the last 10 years: No If all of the above answers are NO, then may proceed with Cephalosporin use. Stuttered with medication     Accupril [Quinapril Hcl] Other (See Comments)    Hyperkalemia=high potassium level   Cozaar  [Losartan  Potassium] Itching   Dml Forte Rash   Entresto [Sacubitril-Valsartan] Other (See Comments)    Hyperkalemia   Flonase  [Fluticasone ] Other (See Comments)    Nasal ulcer.     Lactose Intolerance (Gi) Other (See Comments)    Increased calcium , sneezing, nasal issues   Lipitor [Atorvastatin ] Other (See Comments)    muscle aches/pains   Nasonex  [Mometasone  Furoate] Other (See Comments)    nosebleed   Nsaids Other (See Comments)    Contraindication due to ulcers and coughing up blood    Aspirin  Other (See Comments)    Pt states burns holes in stomach, ulcers    Baking Soda-Fluoride [Sodium Fluoride] Itching and Rash   Chamomile Hives   Clindamycin /Lincomycin  Rash   Codeine Nausea And Vomiting   Furosemide  Other (See Comments)    Ears ring, drops bp quickly. Is okay to take smaller doses.    Lanolin-Petrolatum Rash   Miralax  [Polyethylene Glycol] Rash   Quinine Rash   Reclast  [Zoledronic  Acid] Rash and Other (See Comments)    shaking   Rifampin Other (See Comments)    DOES NOT REMEMBER THIS MEDICATION OR ALLERGY   Singulair  [Montelukast ] Other (See Comments)    dizziness   Sulfa Antibiotics Rash   Tramadol  Rash and Other (See Comments)    Anxiety   Vesicare  [Solifenacin ] Other (See Comments)    stopped me from urinating   Wellbutrin  [Bupropion ] Other (See Comments)    Muscle aches, pain, made my skin crawl    Wool Alcohol [Lanolin] Rash    Family History  Problem Relation Age of Onset   Heart disease Mother    Melanoma Mother    Parkinson's disease Mother    Heart disease Father    Heart disease Brother    Heart failure Other    Breast cancer Other    Hypertension Other      Prior to Admission medications   Medication Sig Start Date End Date Taking? Authorizing Provider  acetaminophen  (TYLENOL ) 325 MG tablet Take 325 mg by mouth every 6 (six) hours as needed for mild pain (pain score 1-3).    [provider]  albuterol  (PROVENTIL ) (2.5 MG/3ML) 0.083% nebulizer solution Take 2.5 mg by nebulization every 6 (six) hours as needed for wheezing or shortness of breath.    [provider]  alendronate  (FOSAMAX ) 70 MG tablet TAKE 1 TABLET BY MOUTH EVERY 7  DAYS WITH FULL GLASS OF WATER  ON AN EMPTY STOMACH 05/26/24   [name], MD  amiodarone  (PACERONE ) 200 MG tablet Take 1 tablet (200 mg total) by mouth 2 (two) times daily. 06/02/24   Lesia Ozell Barter, PA-C  aspirin  81 MG chewable tablet Chew 81 mg by mouth daily.    [provider]  Calcium  Carbonate-Vit D-Min (CALCIUM  600+D PLUS MINERALS) 600-400 MG-UNIT TABS 1 tab p.o. twice daily Patient taking differently: Take 2 tablets by mouth at bedtime. 11/02/23   Curtis Debby PARAS, MD  Continuous Glucose Receiver (FREESTYLE LIBRE 3 READER) DEVI Use for continuous glucose monitoring 04/10/24   [name], MD  Continuous Glucose Sensor (FREESTYLE LIBRE 3 SENSOR) MISC by Does not apply route. Change every 14 days    [provider]  EPINEPHrine  0.3 mg/0.3 mL IJ SOAJ injection Inject 0.3 mg into the muscle as needed for anaphylaxis. 08/27/23   [name], MD  estradiol (ESTRACE) 0.1 MG/GM vaginal cream Place 1 Applicatorful vaginally 3 (three) times a week. Patient taking differently: Place 1 Applicatorful vaginally once a week.    [provider]  fexofenadine  (ALLEGRA ) 180  MG tablet Take 180 mg by mouth daily.    [provider]  fluticasone -salmeterol (WIXELA INHUB ) 250-50 MCG/ACT AEPB Inhale 1 puff into the lungs in the morning and at bedtime.    [provider]  GEMTESA 75 MG TABS Take 75 mg by mouth daily. 01/11/24   [provider]  glucose blood test strip To be used to check blood glucose Dx:E11.8 FreeStyle Precision blood glucose for Freestyle Libre 2 05/02/24   [name], MD  isosorbide  mononitrate (IMDUR ) 30 MG 24 hr tablet TAKE 1 TABLET BY MOUTH DAILY 05/26/24   Pietro Redell RAMAN, MD  levothyroxine  (SYNTHROID ) 112 MCG tablet  TAKE 1 TABLET BY MOUTH DAILY 05/04/24   [name], MD  lubiprostone (AMITIZA) 24 MCG capsule Take 24 mcg by mouth 2 (two) times daily with a meal. Patient reports provider has decreased to every three days. Patient taking differently: Take 24 mcg by mouth daily with breakfast.    [provider]  metFORMIN  (GLUCOPHAGE -XR) 500 MG 24 hr tablet TAKE 1 TABLET BY MOUTH TWICE  DAILY WITH MEALS 04/19/24   [name], [name] D, MD  metoprolol succinate (TOPROL-XL) 50 MG 24 hr tablet Take 1 tablet (50 mg total) by mouth 2 (two) times daily. Take with or immediately following a meal. 05/24/24   Leverne Charlies Helling, PA-C  Multiple Vitamins-Minerals (MULTIPLE VITAMINS/WOMENS PO) Take 1 tablet by mouth daily. Patient taking differently: Take 1 tablet by mouth daily. Patient takes MVI with iron    [provider]  Multiple Vitamins-Minerals (OCUVITE EYE + MULTI) TABS Take 1 tablet by mouth in the morning.    [provider]  pantoprazole  (PROTONIX ) 40 MG tablet TAKE 1 TABLET BY MOUTH DAILY  BEFORE BREAKFAST Patient taking differently: Take 40 mg by mouth 2 (two) times daily. 10/20/22   [name], MD  pravastatin  (PRAVACHOL ) 40 MG tablet Take 40 mg by mouth daily. 01/26/24   [provider]  quiniDINE gluconate 324 MG CR tablet Take 1 tablet (324 mg total) by mouth  2 (two) times daily. 06/01/24   Lesia Ozell Barter, PA-C  Tiotropium Bromide  Monohydrate (SPIRIVA  RESPIMAT) 1.25 MCG/ACT AERS Inhale 1 puff into the lungs daily.    [provider]  torsemide  (DEMADEX ) 20 MG tablet TAKE 1 TABLET BY MOUTH DAILY 05/26/24   Pietro Redell RAMAN, MD  valACYclovir  (VALTREX ) 1000 MG tablet Take 1 tablet (1,000 mg total) by mouth 2 (two) times daily. Patient taking differently: Take 1,000 mg by mouth daily as needed (fever blisters). 05/17/23   Alvia Bring, DO    Physical Exam:  Vitals:   06/05/24 1230 06/05/24 1245 06/05/24 1315 06/05/24 1330  BP: 121/64 111/65 103/78 105/87  Pulse: 90 90 88 89  Resp: 15 (!) 24 18 16   Temp:      TempSrc:      SpO2: 100% 100% 99% 99%  Weight:      Height:       Wt Readings from Last 3 Encounters:  06/05/24 83 kg  06/01/24 (P) 84.1 kg  05/26/24 84.4 kg   Body mass index is 34.57 kg/m.  General: obese built, not in obvious distress, obese built, elderly female HENT: Normocephalic, No scleral pallor or icterus noted. Oral mucosa is moist.  Chest:  Clear breath sounds. No crackles or wheezes.  CVS: S1 &S2 heard. No murmur.  Regular rate and rhythm.  Left-sided AICD in place with dressing. Abdomen: Soft, nontender, nondistended.  Bowel sounds are heard. No abdominal mass palpated Extremities: No cyanosis, clubbing or edema.  Peripheral pulses are palpable. Psych: Alert, awake and oriented, normal mood CNS:  No cranial nerve deficits moves all extremities Skin: Warm and dry.  No rashes noted.  Labs on Admission:   CBC: Recent Labs  Lab 06/01/24 0645 06/05/24 1023  WBC 5.8 6.6  NEUTROABS  --  4.6  HGB 13.0 13.0  HCT 40.2 40.1  MCV 98.8 98.8  PLT 280 178    Basic Metabolic Panel: Recent Labs  Lab 06/01/24 0645 06/05/24 1023  NA 140 137  K 3.9 3.5  CL 101 102  CO2 26 17*  GLUCOSE 98 112*  BUN 14 24*  CREATININE 0.84 1.01*  CALCIUM  9.9 9.8  MG 1.7 1.7    Liver Function Tests: Recent  Labs  Lab 06/05/24 1023  AST 41  ALT 28  ALKPHOS 99  BILITOT 0.6  PROT 6.7  ALBUMIN 3.3*   No results for input(s): LIPASE, AMYLASE in the last 168 hours. No results for input(s): AMMONIA in the last 168 hours.  Cardiac Enzymes: No results for input(s): CKTOTAL, CKMB, CKMBINDEX, TROPONINI in the last 168 hours.  BNP (last 3 results) Recent Labs    12/20/23 1443 06/05/24 1024  BNP 459.4* 1,051.8*    ProBNP (last 3 results) No results for input(s): PROBNP in the last 8760 hours.  CBG: Recent Labs  Lab 06/01/24 1140 06/01/24 1527 06/01/24 2107 06/02/24 0722 06/02/24 1206  GLUCAP 114* 96 121* 108* 107*    Lipase     Component Value Date/Time   LIPASE 19 05/12/2024 2137     Urinalysis    Component Value Date/Time   COLORURINE YELLOW 05/13/2024 0941   APPEARANCEUR CLEAR 05/13/2024 0941   APPEARANCEUR Clear 12/20/2023 1443   LABSPEC 1.018 05/13/2024 0941   PHURINE 5.0 05/13/2024 0941   GLUCOSEU NEGATIVE 05/13/2024 0941   HGBUR NEGATIVE 05/13/2024 0941   BILIRUBINUR NEGATIVE 05/13/2024 0941   BILIRUBINUR Negative 12/20/2023 1443   KETONESUR NEGATIVE 05/13/2024 0941   PROTEINUR NEGATIVE 05/13/2024 0941   UROBILINOGEN 0.2 11/23/2023 1334   UROBILINOGEN 0.2 04/18/2010 1340   NITRITE NEGATIVE 05/13/2024 0941   LEUKOCYTESUR NEGATIVE 05/13/2024 0941     Drugs of Abuse  No results found for: LABOPIA, COCAINSCRNUR, LABBENZ, AMPHETMU, THCU, LABBARB    Radiological Exams on Admission: DG Chest Portable 1 View Result Date: 06/05/2024 CLINICAL DATA:  Chest pain EXAM: PORTABLE CHEST 1 VIEW COMPARISON:  06/01/2024 FINDINGS: The cardio pericardial silhouette is enlarged. The lungs are clear without focal pneumonia, edema, pneumothorax or pleural effusion. Pulmonary edema seen previously not evident on the current study. Left-sided permanent pacemaker/AICD again noted. Telemetry leads overlie the chest. IMPRESSION: No active disease.  Electronically Signed   By: Camellia Candle M.D.   On: 06/05/2024 11:01    EKG: Personally reviewed by me which shows dual paced rhythm  Consultant:  Electrophysiology cardiology,  palliative care  Code Status: Full code.  Will need to have ongoing goals of discussion   Microbiology none  Antibiotics: None  Family Communication:  Patients' condition and plan of care including tests being ordered have been discussed with the patient  who indicate understanding and agree with the plan.   Status is: Observation   Severity of Illness: The appropriate patient status for this patient is OBSERVATION. Observation status is judged to be reasonable and necessary in order to provide the required intensity of service to ensure the patient's safety. The patient's presenting symptoms, physical exam findings, and initial radiographic and laboratory data in the context of their medical condition is felt to place them at decreased risk for further clinical deterioration. Furthermore, it is anticipated that the patient will be medically stable for discharge from the hospital within 2 midnights of admission.   Signed, Vernal Alstrom, MD [facility] 06/05/2024

## 2024-06-05 NOTE — Telephone Encounter (Signed)
 Reviewed remote interrogation. MMVT 130-140s successfully ATP x2. Tried to call to update patient, left VM. No changes.   Tanya DELENA Primus, MD [facility]  Cardiac Electrophysiology

## 2024-06-05 NOTE — Telephone Encounter (Signed)
 Pt called cardiac after hours with symptoms.  Transmissions received at 12:37 am, 2:01 am and 2:08 am.  Last transmission report: Alert remote transmission:  3 or more VT/VF episodes in a day. 2 VT events, events occurred 10/13 @ 02:01, 48sec in duration, HR 133, V>A, pace terminated with 3 bursts of ATP Second event 10/13 @ 02:03, 36sec in duration, HR 130, V>A, pace terminated with 2 bursts of ATP.    Discussed transmission with [name], PA.  Advised Pt to go to ER for evaluation. Outreach  made to Pt.  Per Pt she continues to feel nauseous.  Advised she should call 911.  She indicates understanding and in agreement.   Also contacted Pt's son to advise Pt should go to the ER for evaluation.  Presenting rhythm at 2:08 am:   First transmission:   Second transmission:  Third transmission:

## 2024-06-05 NOTE — ED Notes (Signed)
 EDP informed of pt repeatedly having long runs of V-tach.

## 2024-06-05 NOTE — Consult Note (Signed)
 ELECTROPHYSIOLOGY CONSULT NOTE    Patient ID: Tanya Hogan MRN: [id number], DOB/AGE: Jan 17, 1951 73 y.o.  Admit date: 06/05/2024 Date of Consult: 06/05/2024  Primary Physician: [name], MD Primary Cardiologist: Redell Shallow, MD  Electrophysiologist: Dr. Almetta   Referring Provider: Dr. Francesca   Patient Profile: Tanya Hogan is a 73 y.o. female with a history of PVC's/NSVT, VT s/p upgrade to CRT-D, HTN, HFrEF / NICM, OSA on CPAP, Pulmonary HTN, NASH, GERD, IBS, hypothyroidism, DM, depression,& fibromyalgia who is being seen today for the evaluation of VT at the request of Dr. Francesca.  HPI:  Tanya Hogan is a 73 y.o. female who presented to [facility] ER on 06/05/24 with reports of VT.     Admitted at the end of September 2025 with VT. Course notable for diverticulitis requiring treatment with abx > she was treated with Cipro  and tolerated from QTc standpoint.    He admitted 10/5 - 06/02/2024 in the setting of VT storm.  She underwent upgrade of CRT-P to CRT-D system with the addition of an RV lead and capping the LV lead by Dr. Almetta.  Given recurrent VT, she was started on quinidine which was well-tolerated in addition to her baseline amiodarone .  Patient return to the ER on 10/13 AM via EMS with reports of fluttering sensation in her chest since 2 AM.  She also noted dizziness, no syncope, nausea, diarrhea, belching & symptoms are worse when lying down.  Upon EMS arrival the patient was noted to have VT lasting 10 to 15 seconds.  She was treated with an 80 mg IV lidocaine  bolus and started on infusion.  Initial labs pending.  EKG showed dual AV paced rhythm.  Device interrogated with multiple episodes of VT treated with ATP.     She denies chest pain, dyspnea, PND, orthopnea, syncope, edema, weight gain, or early satiety.   Labs              .    Past Medical History:  Diagnosis Date   Alkaline phosphatase elevation    Asthma    Breast cancer  (HCC) 2011   Carpal tunnel syndrome    bilateral   Chronic HFrEF (heart failure with reduced ejection fraction) (HCC)    Cirrhosis, non-alcoholic (HCC)    Closed fracture of unspecified part of radius (alone)    DDD (degenerative disc disease), lumbar    Depression    pt denies   Diabetes mellitus without complication (HCC)    Enlarged heart    Fibromyalgia    GERD (gastroesophageal reflux disease)    History of kidney stones    HTN (hypertension)    Hypothyroidism    IBS (irritable bowel syndrome)    LBBB (left bundle branch block)    Microcytic anemia 05/14/2017   Mitral regurgitation    NICM (nonischemic cardiomyopathy) (HCC)    Pneumonia    Presence of permanent cardiac pacemaker    Pulmonary hypertension (HCC)    PVD (peripheral vascular disease)    2019   Sleep apnea    uses cpap   Tricuspid regurgitation    Ventricular tachyarrhythmia [facility])      Surgical History:  Past Surgical History:  Procedure Laterality Date   BIV ICD GENERATOR CHANGEOUT N/A 05/31/2024   Procedure: BIV ICD GENERATOR CHANGEOUT;  Surgeon: Almetta Donnice LABOR, MD;  Location: [facility] INVASIVE CV LAB;  Service: Cardiovascular;  Laterality: N/A;   BIV PACEMAKER GENERATOR CHANGEOUT N/A 11/03/2021   Procedure: BIV PACEMAKER GENERATOR  CHANGEOUT;  Surgeon: Waddell Danelle ORN, MD;  Location: [facility] INVASIVE CV LAB;  Service: Cardiovascular;  Laterality: N/A;   BREAST LUMPECTOMY Left 2011   COLONOSCOPY     FOOT SURGERY     KNEE SURGERY     LEAD INSERTION N/A 05/31/2024   Procedure: LEAD INSERTION;  Surgeon: Almetta Donnice LABOR, MD;  Location: [facility] INVASIVE CV LAB;  Service: Cardiovascular;  Laterality: N/A;   LEAD REVISION/REPAIR N/A 11/03/2021   Procedure: LEAD REVISION/REPAIR;  Surgeon: Waddell Danelle ORN, MD;  Location: [facility] INVASIVE CV LAB;  Service: Cardiovascular;  Laterality: N/A;   MASTECTOMY Right    05/2019   PACEMAKER GENERATOR CHANGE N/A 08/23/2014   Procedure: PACEMAKER GENERATOR CHANGE;  Surgeon: Danelle ORN Waddell, MD;  Location: [facility] CATH LAB;  Service: Cardiovascular;  Laterality: N/A;   PACEMAKER PLACEMENT  2009   [facility]   RECTAL SURGERY  1976   REDUCTION MAMMAPLASTY Left    05/2019   RIGHT/LEFT HEART CATH AND CORONARY ANGIOGRAPHY N/A 05/18/2024   Procedure: RIGHT/LEFT HEART CATH AND CORONARY ANGIOGRAPHY;  Surgeon: Wendel Lurena POUR, MD;  Location: [facility] INVASIVE CV LAB;  Service: Cardiovascular;  Laterality: N/A;   TOTAL SHOULDER ARTHROPLASTY Right 03/10/2018   TOTAL SHOULDER ARTHROPLASTY Right 03/10/2018   Procedure: RIGHT TOTAL SHOULDER ARTHROPLASTY;  Surgeon: Dozier Soulier, MD;  Location: [facility] OR;  Service: Orthopedics;  Laterality: Right;   TUBAL LIGATION     UPPER EXTREMITY VENOGRAPHY  05/18/2024   Procedure: UPPER EXTREMITY VENOGRAPHY;  Surgeon: Wendel Lurena POUR, MD;  Location: [facility] INVASIVE CV LAB;  Service: Cardiovascular;;     (Not in a hospital admission)   Inpatient Medications:   ondansetron  (ZOFRAN ) IV  4 mg Intravenous Once    Allergies:  Allergies  Allergen Reactions   Injectafer [Ferric Carboxymaltose] Shortness Of Breath    Mild SOB following injectafer infusion Mild SOB following injectafer infusion    Penicillins Anaphylaxis and Rash    Has patient had a PCN reaction causing immediate rash, facial/tongue/throat swelling, SOB or lightheadedness with hypotension: Yes Has patient had a PCN reaction causing severe rash involving mucus membranes or skin necrosis: No Has patient had a PCN reaction that required hospitalization: No Has patient had a PCN reaction occurring within the last 10 years: No If all of the above answers are NO, then may proceed with Cephalosporin use. Stuttered with medication     Accupril [Quinapril Hcl] Other (See Comments)    Hyperkalemia=high potassium level   Cozaar  [Losartan  Potassium] Itching   Dml Forte Rash   Entresto [Sacubitril-Valsartan] Other (See Comments)    Hyperkalemia   Flonase  [Fluticasone ] Other (See  Comments)    Nasal ulcer.     Lactose Intolerance (Gi) Other (See Comments)    Increased calcium , sneezing, nasal issues   Lipitor [Atorvastatin ] Other (See Comments)    muscle aches/pains   Nasonex  [Mometasone  Furoate] Other (See Comments)    nosebleed   Nsaids Other (See Comments)    Contraindication due to ulcers and coughing up blood    Aspirin  Other (See Comments)    Pt states burns holes in stomach, ulcers    Baking Soda-Fluoride [Sodium Fluoride] Itching and Rash   Chamomile Hives   Clindamycin /Lincomycin Rash   Codeine Nausea And Vomiting   Furosemide  Other (See Comments)    Ears ring, drops bp quickly. Is okay to take smaller doses.    Lanolin-Petrolatum Rash   Miralax  [Polyethylene Glycol] Rash   Quinine Rash   Reclast  [Zoledronic  Acid] Rash and  Other (See Comments)    shaking   Rifampin Other (See Comments)    DOES NOT REMEMBER THIS MEDICATION OR ALLERGY   Singulair  [Montelukast ] Other (See Comments)    dizziness   Sulfa Antibiotics Rash   Tramadol  Rash and Other (See Comments)    Anxiety   Vesicare  [Solifenacin ] Other (See Comments)    stopped me from urinating   Wellbutrin  [Bupropion ] Other (See Comments)    Muscle aches, pain, made my skin crawl   Wool Alcohol [Lanolin] Rash    Family History  Problem Relation Age of Onset   Heart disease Mother    Melanoma Mother    Parkinson's disease Mother    Heart disease Father    Heart disease Brother    Heart failure Other    Breast cancer Other    Hypertension Other      Physical Exam: Vitals:   06/05/24 1020 06/05/24 1020  BP: 120/75   Pulse: 89   Resp: 15   SpO2: 100%   Weight:  83 kg  Height:  5' 1 (1.549 m)    GEN- NAD, A&O x 3, normal affect HEENT: Normocephalic, atraumatic Lungs- CTAB, Normal effort.  Heart- Regular rate and rhythm, No M/G/R.  GI- Soft, NT, ND.  Extremities- No clubbing, cyanosis, or edema   Radiology/Studies: EP PPM/ICD IMPLANT Result Date:  06/01/2024 Recommendations: 1. Routine post-procedure care with bedrest for 3 hours 2. No heparin  (IV or subcutaneous) for 48 hours. No enoxaparin  (IV or subcutaneous) for 7 days. 3. PA/lateral CXR in AM 4. Wound check in AM 5. Device interrogation in AM 6. NPO at midnight  [name], MD [facility] Cardiac Electrophysiology   DG Chest 2 View Result Date: 06/01/2024 CLINICAL DATA:  Pacemaker placement. EXAM: CHEST - 2 VIEW COMPARISON:  Radiograph 05/28/2024 FINDINGS: Multi lead pacemaker in place. No pneumothorax. Cardiomegaly is stable. Mediastinal contours are unchanged. Mild pulmonary edema persists. No confluent opacity. No pleural effusion. Right shoulder arthroplasty. IMPRESSION: 1. Multi lead pacemaker in place. No pneumothorax. 2. Stable cardiomegaly and mild pulmonary edema. Electronically Signed   By: Andrea Gasman M.D.   On: 06/01/2024 11:37   DG Chest Portable 1 View Result Date: 05/28/2024 CLINICAL DATA:  fatigue, SOB, near syncope EXAM: PORTABLE CHEST 1 VIEW COMPARISON:  May 12, 2024 FINDINGS: The cardiomediastinal silhouette is unchanged and enlarged in contour.LEFT chest cardiac pacing device. No pleural effusion. No pneumothorax. Mildly increased reticular prominence diffusely. Mild peribronchial cuffing. IMPRESSION: Constellation of findings are favored to reflect mild pulmonary edema. Electronically Signed   By: Corean Salter M.D.   On: 05/28/2024 11:58   CARDIAC CATHETERIZATION Result Date: 05/18/2024 1.  Normal right dominant circulation with no obstructive coronary artery disease. 2.  Venography from the left axillary demonstrated that the subclavian vein proximal to the insertion site of multiple leads is patent however a wire and catheter could not cross the this confluence. 3.  Fick cardiac output of 5.7 L/min and Fick cardiac index of 3.1 L/min/m with following hemodynamics:  Right atrial pressure mean of 8 mmHg  Right ventricular pressure 52/1  with an end-diastolic pressure of 10 mmHg  Wedge pressure mean of 23 mmHg with V waves to 29 mmHg  PA pressure of 51/27 with mean of 37 mmHg  PVR of PVR of 2.5 Woods units  PA pulsatility index of 3 4.  LVEDP of 21 to 24 mmHg across the cardiac cycle Recommendation: Medical therapy   PERIPHERAL VASCULAR CATHETERIZATION Result Date:  05/18/2024 1.  Normal right dominant circulation with no obstructive coronary artery disease. 2.  Venography from the left axillary demonstrated that the subclavian vein proximal to the insertion site of multiple leads is patent however a wire and catheter could not cross the this confluence. 3.  Fick cardiac output of 5.7 L/min and Fick cardiac index of 3.1 L/min/m with following hemodynamics:  Right atrial pressure mean of 8 mmHg  Right ventricular pressure 52/1 with an end-diastolic pressure of 10 mmHg  Wedge pressure mean of 23 mmHg with V waves to 29 mmHg  PA pressure of 51/27 with mean of 37 mmHg  PVR of PVR of 2.5 Woods units  PA pulsatility index of 3 4.  LVEDP of 21 to 24 mmHg across the cardiac cycle Recommendation: Medical therapy   ECHOCARDIOGRAM COMPLETE Result Date: 05/13/2024    ECHOCARDIOGRAM REPORT   Patient Name:   TAYRA DAWE Date of Exam: 05/13/2024 Medical Rec #:  [id number]       Height:       61.0 in Accession #:    [id number]      Weight:       194.0 lb Date of Birth:  06-03-1951        BSA:          1.864 m Patient Age:    73 years        BP:           101/71 mmHg Patient Gender: F               HR:           80 bpm. Exam Location:  Inpatient Procedure: 2D Echo and Intracardiac Opacification Agent (Both Spectral and Color            Flow Doppler were utilized during procedure). Indications:    Atrial Flutter  History:        Patient has prior history of Echocardiogram examinations. CHF;                 Risk Factors:Hypertension.  Sonographer:    Charmaine Gaskins Referring Phys: [id number] NKIRU C OSUDE IMPRESSIONS  1. No apical thrombus with Definity  contrast.  Left ventricular ejection fraction, by estimation, is 20 to 25%. Left ventricular ejection fraction by 2D MOD biplane is 25.1 %. The left ventricle has severely decreased function. The left ventricle demonstrates global hypokinesis. The left ventricular internal cavity size was moderately dilated. Left ventricular diastolic parameters are consistent with Grade II diastolic dysfunction (pseudonormalization). Elevated left ventricular end-diastolic pressure.  2. Right ventricular systolic function is normal. The right ventricular size is moderately enlarged. There is severely elevated pulmonary artery systolic pressure. The estimated right ventricular systolic pressure is 64.6 mmHg.  3. Left atrial size was severely dilated.  4. Right atrial size was severely dilated.  5. The mitral valve is degenerative. Mild mitral valve regurgitation. Moderate mitral annular calcification.  6. The tricuspid valve is abnormal. Tricuspid valve regurgitation is mild to moderate.  7. The aortic valve is tricuspid. Aortic valve regurgitation is not visualized. Aortic valve sclerosis/calcification is present, without any evidence of aortic stenosis.  8. The inferior vena cava is dilated in size with <50% respiratory variability, suggesting right atrial pressure of 15 mmHg. Comparison(s): Changes from prior study are noted. 10/30/2022: LVEF 20-25%, global HK. FINDINGS  Left Ventricle: No apical thrombus with Definity  contrast. Left ventricular ejection fraction, by estimation, is 20 to 25%. Left ventricular ejection fraction by 2D MOD biplane  is 25.1 %. The left ventricle has severely decreased function. The left ventricle demonstrates global hypokinesis. Definity  contrast agent was given IV to delineate the left ventricular endocardial borders. The left ventricular internal cavity size was moderately dilated. There is no left ventricular hypertrophy. Left ventricular diastolic parameters are consistent with Grade II diastolic dysfunction  (pseudonormalization). Elevated left ventricular end-diastolic pressure. Right Ventricle: The right ventricular size is moderately enlarged. No increase in right ventricular wall thickness. Right ventricular systolic function is normal. There is severely elevated pulmonary artery systolic pressure. The tricuspid regurgitant velocity is 3.52 m/s, and with an assumed right atrial pressure of 15 mmHg, the estimated right ventricular systolic pressure is 64.6 mmHg. Left Atrium: Left atrial size was severely dilated. Right Atrium: Right atrial size was severely dilated. Pericardium: There is no evidence of pericardial effusion. Mitral Valve: The mitral valve is degenerative in appearance. There is mild calcification of the posterior mitral valve leaflet(s). Moderate mitral annular calcification. Mild mitral valve regurgitation. Tricuspid Valve: The tricuspid valve is abnormal. Tricuspid valve regurgitation is mild to moderate. Aortic Valve: The aortic valve is tricuspid. Aortic valve regurgitation is not visualized. Aortic valve sclerosis/calcification is present, without any evidence of aortic stenosis. Pulmonic Valve: The pulmonic valve was grossly normal. Pulmonic valve regurgitation is trivial. Aorta: The aortic root and ascending aorta are structurally normal, with no evidence of dilitation. Venous: The inferior vena cava is dilated in size with less than 50% respiratory variability, suggesting right atrial pressure of 15 mmHg. IAS/Shunts: No atrial level shunt detected by color flow Doppler. Additional Comments: A device lead is visualized.  LEFT VENTRICLE PLAX 2D                        Biplane EF (MOD) LVIDd:         6.20 cm         LV Biplane EF:   Left LVIDs:         5.50 cm                          ventricular LV PW:         1.00 cm                          ejection LV IVS:        0.90 cm                          fraction by LVOT diam:     2.10 cm                          2D MOD LV SV:         76                                biplane is LV SV Index:   41                               25.1 %. LVOT Area:     3.46 cm                                Diastology  LV e' medial:    3.08 cm/s LV Volumes (MOD)               LV E/e' medial:  28.6 LV vol d, MOD    287.0 ml      LV e' lateral:   5.51 cm/s A2C:                           LV E/e' lateral: 16.0 LV vol d, MOD    269.0 ml A4C: LV vol s, MOD    215.0 ml A2C: LV vol s, MOD    197.0 ml A4C: LV SV MOD A2C:   72.0 ml LV SV MOD A4C:   269.0 ml LV SV MOD BP:    69.3 ml RIGHT VENTRICLE RV Basal diam:  3.90 cm RV Mid diam:    4.00 cm RV S prime:     14.50 cm/s LEFT ATRIUM              Index        RIGHT ATRIUM           Index LA diam:        5.00 cm  2.68 cm/m   RA Area:     26.00 cm LA Vol (A2C):   134.0 ml 71.87 ml/m  RA Volume:   91.80 ml  49.24 ml/m LA Vol (A4C):   122.0 ml 65.43 ml/m LA Biplane Vol: 135.0 ml 72.41 ml/m  AORTIC VALVE LVOT Vmax:   111.00 cm/s LVOT Vmean:  84.800 cm/s LVOT VTI:    0.220 m  AORTA Ao Asc diam: 3.00 cm MITRAL VALVE                TRICUSPID VALVE MV Area (PHT): 4.99 cm     TR Peak grad:   49.6 mmHg MV Decel Time: 152 msec     TR Vmax:        352.00 cm/s MV E velocity: 88.00 cm/s MV A velocity: 115.00 cm/s  SHUNTS MV E/A ratio:  0.77         Systemic VTI:  0.22 m                             Systemic Diam: 2.10 cm Vinie Maxcy MD Electronically signed by Vinie Maxcy MD Signature Date/Time: 05/13/2024/3:28:01 PM    Final    DG Chest Port 1 View Result Date: 05/12/2024 EXAM: 1 VIEW XRAY OF THE CHEST 05/12/2024 09:45:00 PM COMPARISON: 02/16/2024 CLINICAL HISTORY: Shortness of breath. Pt recently d/c from hospital for diverticulitis Wednesday. Diarrhea resolved, now c/o emesis today x 1 hour. Reports chills and diaphoresis. C/o joint pain. FINDINGS: LINES, TUBES AND DEVICES: Defibrillator pads on left chest. Left subclavian approach biventricular pacer with leads in right atrium, right ventricle, and coronary sinus.  LUNGS AND PLEURA: Increasing interstitial markings, favoring possible mild perihilar edema, although chronic. No focal pulmonary opacity. No pleural effusion. No pneumothorax. HEART AND MEDIASTINUM: Cardiomegaly. BONES AND SOFT TISSUES: Status post right shoulder arthroplasty. No acute osseous abnormality. IMPRESSION: 1. Possible mild perihilar edema, chronic. 2. Cardiomegaly. Electronically signed by: Pinkie Pebbles MD 05/12/2024 09:56 PM EDT RP Workstation: [id number]    EKG: 06/05/24 > AV paced 89 bpm  (personally reviewed)  TELEMETRY: AV paced 90's (personally reviewed)  DEVICE HISTORY:  MDT PPM 11/14/01 > upgrade to CRT-P 11/03/21 > further upgraded to CRT-D 05/31/24.  Abandoned LV Lead  Bedside Interrogation 06/05/24 shows normal device function, multiple episodes of VT with ATP (ATP CL at > successful).  Programmed to have iATP and additional Burst.    Programming changes as follows:      Assessment/Plan:  MMVT / VT Storm    End-Stage Heart Failure / NICM -await electrolytes  -goal K+>4, Mg+>2 -device too new to measure volume status > euvolemic on exam -change quinidine gluconate to quinidine sulfate 200 mg TID  -continue amiodarone  -LRL at 90 bpm on device  -not a candidate for ablation given multiple co-morbidities and limited baseline functional status -GDMT > Toprol 50 mg BID, Torsemide . GDMT limited by extensive allergy list. -recommend Palliative Care for goals of care (consulted) -outer dressing removed from device site > steri strips dry / intact, no edema or hematoma at site  Hypertension  -well controlled on current regimen    DM  -per [facility]   Recent Diverticulitis  IBS NASH -completed abx, diarrhea on admit / GI upset > may be AAD vs baseline GI issues / recent diverticulitis   Hypothyroidism  -per [facility], mildly elevated TSH   OSA  -defer CPAP to [facility]   Depression / Fibromyalgia  -per [facility]  -avoid QT prolonging agents   For questions or  updates, please contact [facility] Please consult [email or web address] for contact info under     Signed, Daphne Barrack, NP-C, AGACNP-BC [facility] - Electrophysiology  06/05/2024, 10:39 AM

## 2024-06-05 NOTE — ED Provider Notes (Signed)
 [place] [facility] Provider Note  CSN: [id number] Arrival date & time: 06/05/24 1008  Chief Complaint(s) vtach  HPI Tanya Hogan is a 73 y.o. female history of CHF, cirrhosis, hypertension, hypothyroidism, recurrent V. tach, recent ICD placement presenting with palpitations.  Patient reports palpitations since last night.  Reports associated nausea, vomiting, weakness.  Denies chest pain.  Denies diaphoresis.  Denies shortness of breath, abdominal pain.  Denies leg swelling.  Reports she is taking all her medicines.  Denies any ICD shocks.  Called her cardiologist, remote interrogation showed VT.  Did receive ATP but reverted and called paramedics.  Paramedics found that she was in VT and gave 80 mg of lidocaine    Past Medical History Past Medical History:  Diagnosis Date   Alkaline phosphatase elevation    Asthma    Breast cancer (HCC) 2011   Carpal tunnel syndrome    bilateral   Chronic HFrEF (heart failure with reduced ejection fraction) (HCC)    Cirrhosis, non-alcoholic (HCC)    Closed fracture of unspecified part of radius (alone)    DDD (degenerative disc disease), lumbar    Depression    pt denies   Diabetes mellitus without complication (HCC)    Enlarged heart    Fibromyalgia    GERD (gastroesophageal reflux disease)    History of kidney stones    HTN (hypertension)    Hypothyroidism    IBS (irritable bowel syndrome)    LBBB (left bundle branch block)    Microcytic anemia 05/14/2017   Mitral regurgitation    NICM (nonischemic cardiomyopathy) (HCC)    Pneumonia    Presence of permanent cardiac pacemaker    Pulmonary hypertension (HCC)    PVD (peripheral vascular disease)    2019   Sleep apnea    uses cpap   Tricuspid regurgitation    Ventricular tachyarrhythmia [facility])    Patient Active Problem List   Diagnosis Date Noted   Ventricular tachycardia (HCC) 05/28/2024   SVT (supraventricular tachycardia) 05/13/2024   V  tach (HCC) 05/12/2024   Brain fog 11/23/2023   Overflow diarrhea 11/23/2023   Sensation as if bladder still full 11/23/2023   Abdominal fullness 11/23/2023   Unintended weight loss 11/23/2023   Fracture of base of fifth metatarsal bone of left foot 11/02/2023   Presence of heart assist device (HCC) 10/07/2023   Primary osteoarthritis of left shoulder 09/08/2023   Injection site reaction 05/17/2023   Nonischemic cardiomyopathy (HCC) 02/16/2023   Class 3 obesity (HCC) 10/30/2022   Acute on chronic systolic CHF (congestive heart failure) (HCC) 10/29/2022   Stress and adjustment reaction 04/08/2022   Gastroesophageal reflux disease 02/16/2022   AV block, 3rd degree (HCC) 10/24/2021   COPD (chronic obstructive pulmonary disease) (HCC) 09/21/2021   Osteoporosis 02/28/2021   Primary osteoarthritis of both knees 01/14/2021   Irritable bowel syndrome with constipation 09/16/2020   Aortic atherosclerosis 06/11/2020   Benign lipomatous neoplasm of skin and subcutaneous tissue of left leg 06/11/2020   NSVT (nonsustained ventricular tachycardia) (HCC) 02/14/2020   Chronic constipation 10/06/2019   Genetic testing 08/14/2019   Ductal carcinoma in situ (DCIS) of right breast 06/15/2019   Arthralgia 04/11/2019   Malignant tumor of breast (HCC) 03/06/2019   Fatigue 03/06/2019   DDD (degenerative disc disease), cervical 11/19/2017   Peripheral edema 05/19/2017   Microcytic anemia 05/14/2017   Delayed sleep phase syndrome 10/29/2016   Primary osteoarthritis of left ankle 09/24/2016   Gastrointestinal food sensitivity 05/21/2016  History of arthroplasty of right shoulder 02/24/2016   Bilateral foot pain 01/22/2016   NASH (nonalcoholic steatohepatitis) 05/26/2015    Class: Stage 3   Nasal septal perforation 07/11/2014   Controlled diabetes mellitus type 2 with complications (HCC) 05/25/2014   Physical deconditioning 04/24/2014   Obesity, Class II, BMI 35-39.9, with comorbidity 06/09/2013   OSA  (obstructive sleep apnea) 06/08/2013   Chronic systolic heart failure (HCC) 05/23/2013   Asthma, moderate persistent 04/18/2013   Chronic cough 04/18/2013   Breast cancer of upper-inner quadrant of left female breast (HCC) 03/18/2010   Type 2 diabetes mellitus with hyperlipidemia (HCC) 11/07/2009   Fibromyalgia 10/29/2009   Biventricular cardiac pacemaker in situ 10/23/2009   Abnormal levels of other serum enzymes 10/13/2009   Hypothyroidism 10/03/2009   Essential hypertension 07/02/2009   Chronic back pain 07/02/2009   Home Medication(s) Prior to Admission medications   Medication Sig Start Date End Date Taking? Authorizing Provider  acetaminophen  (TYLENOL ) 325 MG tablet Take 325 mg by mouth every 6 (six) hours as needed for mild pain (pain score 1-3).    [provider]  albuterol  (PROVENTIL ) (2.5 MG/3ML) 0.083% nebulizer solution Take 2.5 mg by nebulization every 6 (six) hours as needed for wheezing or shortness of breath.    [provider]  alendronate  (FOSAMAX ) 70 MG tablet TAKE 1 TABLET BY MOUTH EVERY 7  DAYS WITH FULL GLASS OF WATER  ON AN EMPTY STOMACH 05/26/24   [name], MD  amiodarone  (PACERONE ) 200 MG tablet Take 1 tablet (200 mg total) by mouth 2 (two) times daily. 06/02/24   Lesia Ozell Barter, PA-C  aspirin  81 MG chewable tablet Chew 81 mg by mouth daily.    [provider]  Calcium  Carbonate-Vit D-Min (CALCIUM  600+D PLUS MINERALS) 600-400 MG-UNIT TABS 1 tab p.o. twice daily Patient taking differently: Take 2 tablets by mouth at bedtime. 11/02/23   Curtis Debby PARAS, MD  Continuous Glucose Receiver (FREESTYLE LIBRE 3 READER) DEVI Use for continuous glucose monitoring 04/10/24   [name], MD  Continuous Glucose Sensor (FREESTYLE LIBRE 3 SENSOR) MISC by Does not apply route. Change every 14 days    [provider]  EPINEPHrine  0.3 mg/0.3 mL IJ SOAJ injection Inject 0.3 mg into the muscle as needed for  anaphylaxis. 08/27/23   [name], MD  estradiol (ESTRACE) 0.1 MG/GM vaginal cream Place 1 Applicatorful vaginally 3 (three) times a week. Patient taking differently: Place 1 Applicatorful vaginally once a week.    [provider]  fexofenadine  (ALLEGRA ) 180 MG tablet Take 180 mg by mouth daily.    [provider]  fluticasone -salmeterol (WIXELA INHUB ) 250-50 MCG/ACT AEPB Inhale 1 puff into the lungs in the morning and at bedtime.    [provider]  GEMTESA 75 MG TABS Take 75 mg by mouth daily. 01/11/24   [provider]  glucose blood test strip To be used to check blood glucose Dx:E11.8 FreeStyle Precision blood glucose for Freestyle Libre 2 05/02/24   [name], MD  isosorbide  mononitrate (IMDUR ) 30 MG 24 hr tablet TAKE 1 TABLET BY MOUTH DAILY 05/26/24   Pietro Redell RAMAN, MD  levothyroxine  (SYNTHROID ) 112 MCG tablet TAKE 1 TABLET BY MOUTH DAILY 05/04/24   [name], [name] D, MD  lubiprostone (AMITIZA) 24 MCG capsule Take 24 mcg by mouth 2 (two) times daily with a meal. Patient reports provider has decreased to every three days. Patient taking differently: Take 24 mcg by mouth daily with breakfast.  [provider]  metFORMIN  (GLUCOPHAGE -XR) 500 MG 24 hr tablet TAKE 1 TABLET BY MOUTH TWICE  DAILY WITH MEALS 04/19/24   [name], [name] D, MD  metoprolol succinate (TOPROL-XL) 50 MG 24 hr tablet Take 1 tablet (50 mg total) by mouth 2 (two) times daily. Take with or immediately following a meal. 05/24/24   Leverne Charlies Helling, PA-C  Multiple Vitamins-Minerals (MULTIPLE VITAMINS/WOMENS PO) Take 1 tablet by mouth daily. Patient taking differently: Take 1 tablet by mouth daily. Patient takes MVI with iron    [provider]  Multiple Vitamins-Minerals (OCUVITE EYE + MULTI) TABS Take 1 tablet by mouth in the morning.    [provider]  pantoprazole  (PROTONIX ) 40 MG tablet TAKE 1 TABLET BY MOUTH DAILY  BEFORE  BREAKFAST Patient taking differently: Take 40 mg by mouth 2 (two) times daily. 10/20/22   [name], MD  pravastatin  (PRAVACHOL ) 40 MG tablet Take 40 mg by mouth daily. 01/26/24   [provider]  quiniDINE gluconate 324 MG CR tablet Take 1 tablet (324 mg total) by mouth 2 (two) times daily. 06/01/24   Lesia Ozell Barter, PA-C  Tiotropium Bromide  Monohydrate (SPIRIVA  RESPIMAT) 1.25 MCG/ACT AERS Inhale 1 puff into the lungs daily.    [provider]  torsemide  (DEMADEX ) 20 MG tablet TAKE 1 TABLET BY MOUTH DAILY 05/26/24   Pietro Redell RAMAN, MD  valACYclovir  (VALTREX ) 1000 MG tablet Take 1 tablet (1,000 mg total) by mouth 2 (two) times daily. Patient taking differently: Take 1,000 mg by mouth daily as needed (fever blisters). 05/17/23   Alvia Bring, DO                                                                                                                                    Past Surgical History Past Surgical History:  Procedure Laterality Date   BIV ICD GENERATOR CHANGEOUT N/A 05/31/2024   Procedure: BIV ICD GENERATOR CHANGEOUT;  Surgeon: Almetta Donnice LABOR, MD;  Location: [facility] INVASIVE CV LAB;  Service: Cardiovascular;  Laterality: N/A;   BIV PACEMAKER GENERATOR CHANGEOUT N/A 11/03/2021   Procedure: BIV PACEMAKER GENERATOR CHANGEOUT;  Surgeon: Waddell Danelle ORN, MD;  Location: [facility] INVASIVE CV LAB;  Service: Cardiovascular;  Laterality: N/A;   BREAST LUMPECTOMY Left 2011   COLONOSCOPY     FOOT SURGERY     KNEE SURGERY     LEAD INSERTION N/A 05/31/2024   Procedure: LEAD INSERTION;  Surgeon: Almetta Donnice LABOR, MD;  Location: [facility] INVASIVE CV LAB;  Service: Cardiovascular;  Laterality: N/A;   LEAD REVISION/REPAIR N/A 11/03/2021   Procedure: LEAD REVISION/REPAIR;  Surgeon: Waddell Danelle ORN, MD;  Location: [facility] INVASIVE CV LAB;  Service: Cardiovascular;  Laterality: N/A;   MASTECTOMY Right    05/2019   PACEMAKER GENERATOR CHANGE N/A 08/23/2014   Procedure: PACEMAKER  GENERATOR CHANGE;  Surgeon: Danelle ORN Waddell, MD;  Location: [facility] CATH LAB;  Service: Cardiovascular;  Laterality:  N/A;   PACEMAKER PLACEMENT  2009   [facility]   RECTAL SURGERY  1976   REDUCTION MAMMAPLASTY Left    05/2019   RIGHT/LEFT HEART CATH AND CORONARY ANGIOGRAPHY N/A 05/18/2024   Procedure: RIGHT/LEFT HEART CATH AND CORONARY ANGIOGRAPHY;  Surgeon: Wendel Lurena POUR, MD;  Location: [facility] INVASIVE CV LAB;  Service: Cardiovascular;  Laterality: N/A;   TOTAL SHOULDER ARTHROPLASTY Right 03/10/2018   TOTAL SHOULDER ARTHROPLASTY Right 03/10/2018   Procedure: RIGHT TOTAL SHOULDER ARTHROPLASTY;  Surgeon: Dozier Soulier, MD;  Location: [facility] OR;  Service: Orthopedics;  Laterality: Right;   TUBAL LIGATION     UPPER EXTREMITY VENOGRAPHY  05/18/2024   Procedure: UPPER EXTREMITY VENOGRAPHY;  Surgeon: Wendel Lurena POUR, MD;  Location: [facility] INVASIVE CV LAB;  Service: Cardiovascular;;   Family History Family History  Problem Relation Age of Onset   Heart disease Mother    Melanoma Mother    Parkinson's disease Mother    Heart disease Father    Heart disease Brother    Heart failure Other    Breast cancer Other    Hypertension Other     Social History Social History   Tobacco Use   Smoking status: Former    Current packs/day: 0.00    Types: Cigarettes    Quit date: 08/24/1966    Years since quitting: 57.8   Smokeless tobacco: Never  Vaping Use   Vaping status: Never Used  Substance Use Topics   Alcohol use: No   Drug use: No   Allergies Injectafer [ferric carboxymaltose], Penicillins, Accupril [quinapril hcl], Cozaar  [losartan  potassium], Dml forte, Entresto [sacubitril-valsartan], Flonase  [fluticasone ], Lactose intolerance (gi), Lipitor [atorvastatin ], Nasonex  [mometasone  furoate], Nsaids, Aspirin , Baking soda-fluoride [sodium fluoride], Chamomile, Clindamycin /lincomycin, Codeine, Furosemide , Lanolin-petrolatum, Miralax  [polyethylene glycol], Quinine, Reclast  [zoledronic  acid], Rifampin,  Singulair  [montelukast ], Sulfa antibiotics, Tramadol , Vesicare  [solifenacin ], Wellbutrin  [bupropion ], and Wool alcohol [lanolin]  Review of Systems Review of Systems  All other systems reviewed and are negative.   Physical Exam Vital Signs  I have reviewed the triage vital signs BP 100/63   Pulse 88   Temp (!) 97.5 F (36.4 C) (Oral)   Resp (!) 25   Ht 5' 1 (1.549 m)   Wt 83 kg   SpO2 100%   BMI 34.57 kg/m  Physical Exam Vitals and nursing note reviewed.  Constitutional:      General: She is not in acute distress.    Appearance: She is well-developed.  HENT:     Head: Normocephalic and atraumatic.     Mouth/Throat:     Mouth: Mucous membranes are moist.  Eyes:     Pupils: Pupils are equal, round, and reactive to light.  Cardiovascular:     Rate and Rhythm: Normal rate and regular rhythm.     Heart sounds: No murmur heard. Pulmonary:     Effort: Pulmonary effort is normal. No respiratory distress.     Breath sounds: Normal breath sounds.  Abdominal:     General: Abdomen is flat.     Palpations: Abdomen is soft.     Tenderness: There is no abdominal tenderness.  Musculoskeletal:        General: No tenderness.     Right lower leg: No edema.     Left lower leg: No edema.  Skin:    General: Skin is warm and dry.  Neurological:     General: No focal deficit present.     Mental Status: She is alert. Mental status is at baseline.  Psychiatric:  Mood and Affect: Mood normal.        Behavior: Behavior normal.     ED Results and Treatments Labs (all labs ordered are listed, but only abnormal results are displayed) Labs Reviewed  COMPREHENSIVE METABOLIC PANEL WITH GFR - Abnormal; Notable for the following components:      Result Value   CO2 17 (*)    Glucose, Bld 112 (*)    BUN 24 (*)    Creatinine, Ser 1.01 (*)    Albumin 3.3 (*)    GFR, Estimated 58 (*)    Anion gap 18 (*)    All other components within normal limits  BRAIN NATRIURETIC PEPTIDE -  Abnormal; Notable for the following components:   B Natriuretic Peptide 1,051.8 (*)    All other components within normal limits  TSH - Abnormal; Notable for the following components:   TSH 4.533 (*)    All other components within normal limits  TROPONIN I (HIGH SENSITIVITY) - Abnormal; Notable for the following components:   Troponin I (High Sensitivity) 20 (*)    All other components within normal limits  CBC WITH DIFFERENTIAL/PLATELET  MAGNESIUM   TROPONIN I (HIGH SENSITIVITY)                                                                                                                          Radiology DG Chest Portable 1 View Result Date: 06/05/2024 CLINICAL DATA:  Chest pain EXAM: PORTABLE CHEST 1 VIEW COMPARISON:  06/01/2024 FINDINGS: The cardio pericardial silhouette is enlarged. The lungs are clear without focal pneumonia, edema, pneumothorax or pleural effusion. Pulmonary edema seen previously not evident on the current study. Left-sided permanent pacemaker/AICD again noted. Telemetry leads overlie the chest. IMPRESSION: No active disease. Electronically Signed   By: Camellia Candle M.D.   On: 06/05/2024 11:01    Pertinent labs & imaging results that were available during my care of the patient were reviewed by me and considered in my medical decision making (see MDM for details).  Medications Ordered in ED Medications  ondansetron  (ZOFRAN ) injection 4 mg (4 mg Intravenous Given 06/05/24 1047)                                                                                                                                     Procedures Procedures  (including critical care time)  Medical Decision Making / ED Course  MDM:  73 year old presenting to the emergency department with palpitations.  Patient overall well-appearing, physical examination is reassuring, heart regular rate and rhythm.  Appears euvolemic.  Patient has very complicated history with recurrent VT,  end-stage CHF.  Recently admitted for medication adjustment, had CRT-D placed.  Per review did have ATP but no shocks.  Was still in VT went paramedics arrived and was given lidocaine , subsequently back in sinus rhythm.  Seems to be more of a chronic problem.  Will check electrolytes, chest x-ray.  Patient denies chest pain but did have nausea and vomiting, so we will check troponin  Clinical Course as of 06/05/24 1235  Mon Jun 05, 2024  1233 Patient evaluated by EP.  Few therapeutic options available for patient.  She has occasionally had further episodes of NSVT.  EP recommends patient be admitted for further inpatient care. [WS]    Clinical Course User Index [WS] Francesca Elsie CROME, MD     Additional history obtained: -Additional history obtained from ems -External records from outside source obtained and reviewed including: Chart review including previous notes, labs, imaging, consultation notes including prior notes    Lab Tests: -I ordered, reviewed, and interpreted labs.   The pertinent results include:   Labs Reviewed  COMPREHENSIVE METABOLIC PANEL WITH GFR - Abnormal; Notable for the following components:      Result Value   CO2 17 (*)    Glucose, Bld 112 (*)    BUN 24 (*)    Creatinine, Ser 1.01 (*)    Albumin 3.3 (*)    GFR, Estimated 58 (*)    Anion gap 18 (*)    All other components within normal limits  BRAIN NATRIURETIC PEPTIDE - Abnormal; Notable for the following components:   B Natriuretic Peptide 1,051.8 (*)    All other components within normal limits  TSH - Abnormal; Notable for the following components:   TSH 4.533 (*)    All other components within normal limits  TROPONIN I (HIGH SENSITIVITY) - Abnormal; Notable for the following components:   Troponin I (High Sensitivity) 20 (*)    All other components within normal limits  CBC WITH DIFFERENTIAL/PLATELET  MAGNESIUM   TROPONIN I (HIGH SENSITIVITY)    Notable for elevated BNP, mild troponin  elevation  EKG   EKG Interpretation Date/Time:  Monday June 05 2024 10:20:06 EDT Ventricular Rate:  89 PR Interval:  46 QRS Duration:  193 QT Interval:  464 QTC Calculation: 565 R Axis:   157  Text Interpretation: Atrial-ventricular dual-paced rhythm No further analysis attempted due to paced rhythm Artifact in lead(s) I II III aVR aVL aVF V1 Confirmed by Francesca Elsie ([id number]) on 06/05/2024 10:41:38 AM         Imaging Studies ordered: I ordered imaging studies including CXR On my interpretation imaging demonstrates no acute process I independently visualized and interpreted imaging. I agree with the radiologist interpretation   Medicines ordered and prescription drug management: Meds ordered this encounter  Medications   ondansetron  (ZOFRAN ) injection 4 mg    -I have reviewed the patients home medicines and have made adjustments as needed    Reevaluation: After the interventions noted above, I reevaluated the patient and found that their symptoms have improved  Co morbidities that complicate the patient evaluation  Past Medical History:  Diagnosis Date   Alkaline phosphatase elevation    Asthma    Breast cancer (HCC) 2011   Carpal tunnel syndrome    bilateral   Chronic HFrEF (heart  failure with reduced ejection fraction) (HCC)    Cirrhosis, non-alcoholic (HCC)    Closed fracture of unspecified part of radius (alone)    DDD (degenerative disc disease), lumbar    Depression    pt denies   Diabetes mellitus without complication (HCC)    Enlarged heart    Fibromyalgia    GERD (gastroesophageal reflux disease)    History of kidney stones    HTN (hypertension)    Hypothyroidism    IBS (irritable bowel syndrome)    LBBB (left bundle branch block)    Microcytic anemia 05/14/2017   Mitral regurgitation    NICM (nonischemic cardiomyopathy) (HCC)    Pneumonia    Presence of permanent cardiac pacemaker    Pulmonary hypertension (HCC)    PVD (peripheral  vascular disease)    2019   Sleep apnea    uses cpap   Tricuspid regurgitation    Ventricular tachyarrhythmia (HCC)       Dispostion: Disposition decision including need for hospitalization was considered, and patient admitted to the hospital.    Final Clinical Impression(s) / ED Diagnoses Final diagnoses:  Ventricular tachycardia (HCC)     This chart was dictated using voice recognition software.  Despite best efforts to proofread,  errors can occur which can change the documentation meaning.    Francesca Elsie CROME, MD 06/05/24 1235

## 2024-06-05 NOTE — ED Triage Notes (Signed)
 PT BIB [facility] C/o chest pain(palpations). Ems reported pt had a flutter feelings 2am. Upon EMS arrival Vtach 10-15 seconds 80mg  lidocaine  bolus via PIV 20G R hand. Pt getting lidocaine  drip  right hand upon arrival at the ER. EMS reports pt was here 3 days ago d/t pace maker batteries  being changed   Pt A/ox4  Sinus now 126/58 98% 4 liters 20g right hand  20g left forearm

## 2024-06-05 NOTE — ED Notes (Addendum)
 X-ray at bedside.

## 2024-06-05 NOTE — Progress Notes (Signed)
 Labs reviewed:   K 3.5, Mg 1.7    Plan: -replace K+ IV given nausea  -replace Mg+ 2gm IV  -plan for magnesium  taurate at discharge  -Palliative Care called for consult    Daphne Barrack, NP-C, AGACNP-BC [facility] - Electrophysiology  06/05/2024, 1:10 PM

## 2024-06-05 NOTE — ED Notes (Signed)
 Cards at bedside

## 2024-06-05 NOTE — ED Notes (Addendum)
 Both iIVs placed prior to arrival re dressed and extension added to them , flushed well.

## 2024-06-05 NOTE — Transitions of Care (Post Inpatient/ED Visit) (Signed)
   06/05/2024  Name: Tanya Hogan MRN: [id number] DOB: 11/28/1950  Today's TOC FU Call Status: Today's TOC FU Call Status:: Unsuccessful Call (1st Attempt) Unsuccessful Call (1st Attempt) Date: 06/05/24  Attempted to reach the patient regarding the most recent Inpatient/ED visit.  Follow Up Plan: Additional outreach attempts will be made to reach the patient to complete the Transitions of Care (Post Inpatient/ED visit) call.   Shona Prow RN, CCM [place]  [facility]BC[facility]Population Health RN Care Manager [phone]

## 2024-06-05 NOTE — Telephone Encounter (Signed)
 Called complaining of dizziness, nausea, more vertiginous, no tinnitus, worse when lying down. Started 20 minutes ago, no known trigger.  Possible palpitations.  No swelling around device implantation site, slightly puffy.  s/p pacemaker upgrade to a CRT-D, discharged yesterday  109/80, pulse 89 taken while on the phone with me.  Recommend some hydration to see if patient feels better.  If not, would urge ED evaluation.  Andee Flatten MD Cards on call

## 2024-06-05 NOTE — ED Notes (Signed)
 CCMD Called

## 2024-06-05 NOTE — ED Notes (Signed)
 Pt went into vtach for a short time EDP made aware.

## 2024-06-06 ENCOUNTER — Telehealth: Payer: Self-pay

## 2024-06-06 ENCOUNTER — Encounter: Admitting: Physician Assistant

## 2024-06-06 ENCOUNTER — Encounter (HOSPITAL_COMMUNITY): Payer: Self-pay | Admitting: Internal Medicine

## 2024-06-06 DIAGNOSIS — Z515 Encounter for palliative care: Secondary | ICD-10-CM | POA: Diagnosis not present

## 2024-06-06 DIAGNOSIS — Z95810 Presence of automatic (implantable) cardiac defibrillator: Secondary | ICD-10-CM | POA: Diagnosis not present

## 2024-06-06 DIAGNOSIS — K7581 Nonalcoholic steatohepatitis (NASH): Secondary | ICD-10-CM | POA: Diagnosis not present

## 2024-06-06 DIAGNOSIS — Z7189 Other specified counseling: Secondary | ICD-10-CM | POA: Diagnosis not present

## 2024-06-06 DIAGNOSIS — I428 Other cardiomyopathies: Secondary | ICD-10-CM | POA: Diagnosis not present

## 2024-06-06 DIAGNOSIS — I472 Ventricular tachycardia, unspecified: Secondary | ICD-10-CM | POA: Diagnosis not present

## 2024-06-06 DIAGNOSIS — Z79899 Other long term (current) drug therapy: Secondary | ICD-10-CM | POA: Diagnosis not present

## 2024-06-06 DIAGNOSIS — I5022 Chronic systolic (congestive) heart failure: Secondary | ICD-10-CM | POA: Diagnosis not present

## 2024-06-06 LAB — BASIC METABOLIC PANEL WITH GFR
Anion gap: 13 (ref 5–15)
BUN: 19 mg/dL (ref 8–23)
CO2: 22 mmol/L (ref 22–32)
Calcium: 9.5 mg/dL (ref 8.9–10.3)
Chloride: 105 mmol/L (ref 98–111)
Creatinine, Ser: 0.9 mg/dL (ref 0.44–1.00)
GFR, Estimated: >60 mL/min (ref >60)
Glucose, Bld: 91 mg/dL (ref 70–99)
Potassium: 5.3 mmol/L — ABNORMAL HIGH (ref 3.5–5.1)
Sodium: 140 mmol/L (ref 135–145)

## 2024-06-06 LAB — GLUCOSE, CAPILLARY
Glucose-Capillary: 101 mg/dL — ABNORMAL HIGH (ref 70–99)
Glucose-Capillary: 149 mg/dL — ABNORMAL HIGH (ref 70–99)

## 2024-06-06 LAB — CBC
HCT: 40.5 % (ref 36.0–46.0)
Hemoglobin: 12.8 g/dL (ref 12.0–15.0)
MCH: 31.8 pg (ref 26.0–34.0)
MCHC: 31.6 g/dL (ref 30.0–36.0)
MCV: 100.5 fL — ABNORMAL HIGH (ref 80.0–100.0)
Platelets: 194 K/uL (ref 150–400)
RBC: 4.03 MIL/uL (ref 3.87–5.11)
RDW: 13.1 % (ref 11.5–15.5)
WBC: 5.8 K/uL (ref 4.0–10.5)
nRBC: 0 % (ref 0.0–0.2)

## 2024-06-06 LAB — CBG MONITORING, ED
Glucose-Capillary: 93 mg/dL (ref 70–99)
Glucose-Capillary: 103 mg/dL — ABNORMAL HIGH (ref 70–99)

## 2024-06-06 LAB — TSH: TSH: 3.548 u[IU]/mL (ref 0.350–4.500)

## 2024-06-06 LAB — MAGNESIUM: Magnesium: 2.4 mg/dL (ref 1.7–2.4)

## 2024-06-06 MED ORDER — UMECLIDINIUM BROMIDE 62.5 MCG/ACT IN AEPB
1.0000 | INHALATION_SPRAY | Freq: Every day | RESPIRATORY_TRACT | Status: DC
  Administered 2024-06-07: 1 via RESPIRATORY_TRACT
  Filled 2024-06-06: qty 7

## 2024-06-06 NOTE — Progress Notes (Signed)
 Patient arrived at 1216, vital signs looked normal, with a soft BP 96/67 (MAP 78), patient adx for runs of V-tach arrhythmias . Patient's BIPAP should be delivered soon.

## 2024-06-06 NOTE — Consult Note (Signed)
 Consultation Note Date: 06/06/2024   Patient Name: Tanya Hogan  DOB: 19-Nov-1950  MRN: [id number]  Age / Sex: 73 y.o., female  PCP: [name], MD Referring Physician: Caleen Burgess BROCKS, MD  Reason for Consultation: Establishing goals of care  HPI/Patient Profile: 73 y.o. female  with past medical history of breast cancer, chronic heart failure with latest EF of 20%, nonalcoholic cirrhosis, depression, diabetes mellitus type 2, GERD, hypertension, hypothyroidism, IBS, valvular heart disease, pulmonary hypertension, sleep apnea and ventricular tachyarrhythmia admitted on 06/05/2024 with palpitations/heart fluttering.   Patient is s/p recent AICD upgrade/revision. She noted that she experienced flare of IBS symptoms (constipation) just prior to arrhythmia which occurring during the other episode as well. She stated that she took nausea medication and laxative and then shortly after she noted palpitations. In the ED, EP provider evaluated and GDMT limited to current medication regimen.  Patient has had 3 admissions in the past 6 months. PMT has been consulted to assist with goals of care conversation.  Clinical Assessment and Goals of Care:  I have reviewed medical records including EPIC notes, labs and imaging, assessed the patient and then met at the bedside to discuss diagnosis prognosis, GOC, EOL wishes, disposition and options.  I introduced Palliative Medicine as specialized medical care for people living with serious illness. It focuses on providing relief from the symptoms and stress of a serious illness. The goal is to improve quality of life for both the patient and the family.  We discussed a brief life review of the patient and then focused on their current illness.   I attempted to elicit values and goals of care important to the patient.    Medical History Review and Understanding:  We discussed patient's acute illness in the context of  their chronic comorbidities. Patient seems to understand the severity of her illness.  Social History: She is widowed. She has a son and daughter. She has cared for her brother for a few years and they just lost their help from a caregiver. Her daughter is currently trying to hire somebody else. 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 She owns property and a horse in [facility], though it has become too difficult for her to care for her horse of 21 years. She has [employer] which is a big help.   Functional and Nutritional State: Patient has half a day in her when she is able to do all of her normal activities without difficulty, then she becomes much more fatigued. Notes difficulty with appetite and 20+ pounds of weight loss since last December, attributed to IBS. Albumin of 3.3 on 10/13. She also notes that it is getting harder for her to follow recipes and cook due to mental fatigue.   Palliative Symptoms: Occasional edema, dyspnea, fatigue  Advance Directives: A detailed discussion regarding advanced directives was had. Patient has a Clinical research associate selected but has not been able to complete documentation due to her illnesses as well as those of her brother this year.  Code Status: Recommended consideration of DNR status, understanding evidenced-based poor outcomes in similar hospitalized patients, as the cause of the arrest is likely associated with chronic/terminal disease rather than a reversible acute cardio-pulmonary event.  Discussion: Patient notes that her quality of life is mostly good, with the exception of when her heart goes into VT. She made a promise to keep her brother out of a nursing facility but she is stressed by her ability to do  this with her own health declining. She is also worried about her ability to both make payments on medical bills and quality for medicaid, stating that her deductible was filed incorrectly last year and delayed her ability to get  medicaid support. She would appreciate any assistance with navigating this and assistance with transportation, as her insurance charges her a lot for this as well.  Her QOL is negatively affected by her inability to drive but she says that she can adjust to this. Her goal is to be able to go to the senior center and play mahjong. She would never find it acceptable to go to a SNF and would want to stay in her own home just like her brother. She acknowledges that this is most likely her new normal and that she will need more support to meet her goal of staying home. Her children have been doing better with her duties than she expected, but she knows they cannot commit as much time to relieving her as she needs. Emotional support and therapeutic listening was provided.  Patient is clear on some boundaries and limitations, but she would like to discuss with her daughter about interventions such as a ventilator or CPR.   Discussed the importance of continued conversation with family and the medical providers regarding overall plan of care and treatment options, ensuring decisions are within the context of the patient's values and GOCs.   Questions and concerns were addressed.  Hard Choices booklet left for review. The patient was encouraged to call with questions or concerns.  PMT will continue to support holistically.   SUMMARY OF RECOMMENDATIONS   -Continue full code. Patient does not think she would want mechanical ventilation or aggressive care -Continue current care plan. Patient verbalized understanding of risk for recurrent hospitalizations due to VT/CHF and lack of long-term treatment options -Goal is to remain in her own home and continue hobbies -Ongoing GOC discussions -Patient will consider completion of advance directives during this admission, would like to talk with her daughter first -No feeding tube, patient would never want to go to a facility -[facility] consulted for assistance with  insurance questions, transportation options -PMT will continue to follow and support   Prognosis:  Concerning with lack of long-term treatment options for acute on chronic illnesses, gradual functional decline  Discharge Planning: To Be Determined      Primary Diagnoses: Present on Admission:  Ventricular tachycardia [facility])   Physical Exam Vitals and nursing note reviewed.  Constitutional:      General: She is not in acute distress.    Appearance: She is ill-appearing.  HENT:     Head: Normocephalic and atraumatic.  Cardiovascular:     Rate and Rhythm: Normal rate.  Pulmonary:     Effort: Pulmonary effort is normal.  Skin:    General: Skin is warm and dry.  Neurological:     Mental Status: She is oriented to person, place, and time.  Psychiatric:        Mood and Affect: Mood normal.        Thought Content: Thought content normal.        Cognition and Memory: Cognition normal.    Vital Signs: BP 112/64   Pulse 89   Temp 98 F (36.7 C) (Oral)   Resp 19   Ht 5' 1 (1.549 m)   Wt 83 kg   SpO2 99%   BMI 34.57 kg/m        SpO2: SpO2: 99 % O2 Device:SpO2:  99 % O2 Flow Rate: .    Crystelle Ferrufino P Jamya Starry, PA-C  Palliative Medicine Team Team phone # [phone]  Thank you for allowing the Palliative Medicine Team to assist in the care of this patient. Please utilize secure chat with additional questions, if there is no response within 30 minutes please call the above phone number.  Palliative Medicine Team providers are available by phone from 7am to 7pm daily and can be reached through the team cell phone.  Should this patient require assistance outside of these hours, please call the patient's attending physician.    Time Total: 75  Visit consisted of counseling and education dealing with the complex and emotionally intense issues of symptom management and palliative care in the setting of serious and potentially life-threatening illness. Greater than 50% of  this time was spent counseling and coordinating care related to the above assessment and plan.  Personally spent 75 minutes in patient care including extensive chart review (labs, imaging, progress/consult notes, vital signs), medically appropraite exam, discussed with treatment team, education to patient, family, and staff, documenting clinical information, medication review and management, coordination of care, and available advanced directive documents.

## 2024-06-06 NOTE — Hospital Course (Addendum)
 Tanya Hogan was admitted to the hospital with the working diagnosis of ventricular tachycardia.   73 year old with history of breast cancer, chronic CHF EF 20%, nonalcoholic cirrhosis, depression, DM2, GERD, HTN, hypothyroidism, sleep apnea, pulmonary hypertension, ventricular tachyarrhythmia who presented with palpitations.  Recent hospitalization 10/5 to 06/02/24 for upgrade Medtronic CRT-P to a CRAT D systemic in addition of RV lead. She was discharged on amiodarone  200 mg bid and quinidine 324 mg bid.  On the day of current hospitalization, she was at home, when suddenly had chest pain with palpitations. It occurred around 2 am, severe enough that EMS was activated. She was found having ventricular tachycardia for 10 to 15 seconds, she was placed on lidocaine  drip after 80 mg bolus. Then transported to the ED.  On her initial physical examination her blood pressure was 121/64, HR 90, RR 24 and 02 saturation 99% Lungs with no wheezing or rhonchi, heart with S1 and S2 present and regular with no gallops, rubs or murmurs, abdomen with no distention and no lower extremity edema.   Na 137, K 3.5 Cl 102 bicarbonate 17 glucose 112, bun 24 cr 1,01 Mg 1,7  AST 41 ALT 28  BNP 1,051  High sensitive troponin 20  Wbc 6,6 hgb 13,0 plt 178  TSH 4.5   Chest radiograph with cardiomegaly, bilateral hilar vascular congestion, no effusions or infiltrates. Pacemaker defibrillator in place with one right atrial lead and biventricular leads.   EKG 89 bpm, normal axis, qtc 567, right bundle branch block, atrial and ventricular pacing with no significant ST segment or T wave changes.   Device interrogation demonstrated episodes of ventricular tachycardia treated with ATP. Several of the ATP schemes were not successful. EP reprogrammed the device to deliver to intrinsic ATP schemes followed by burst ATP with longer pulse sequences. She was still pacing at a base rate of 90 bpm.   Quinidine dose was increased to 200  mg tid.  Telemetry with no further ventricular tachycardia.  Electrolytes were corrected.   10/15 patient hemodynamically stable, will follow up as outpatient.

## 2024-06-06 NOTE — Progress Notes (Signed)
  Patient Name: Tanya Hogan Date of Encounter: 06/06/2024  Primary Cardiologist: Redell Shallow, MD Electrophysiologist: None  Interval Summary   The patient reports she feels some better today > diarrhea improved.   At this time, the patient denies chest pain, shortness of breath, or any new concerns.  Vital Signs    Vitals:   06/06/24 0000 06/06/24 0100 06/06/24 0400 06/06/24 0600  BP: 96/65 103/64 99/62 96/60   Pulse: 88 89 89 90  Resp: (!) 25 (!) 22 19 16   Temp:   98.1 F (36.7 C)   TempSrc:      SpO2: 98% 95% 96% 100%  Weight:      Height:       No intake or output data in the 24 hours ending 06/06/24 0717 Filed Weights   06/05/24 1020  Weight: 83 kg    Physical Exam    GEN- NAD, Alert and oriented  Lungs- Clear to ausculation bilaterally, normal work of breathing Cardiac- regular rate / rhythm with frequent ectopy rate and rhythm, no murmurs, rubs or gallops, device site wnl / steri strips intact GI- soft, NT, ND, + BS Extremities- no clubbing or cyanosis. No edema  Telemetry    AP 90's with frequent PVC's, NSVT but no VT overnight (personally reviewed)  Hospital Course    DANAYSHA KIRN is a 73 y.o. female with a PMH of  PVC's/NSVT, VT s/p upgrade to CRT-D, HTN, HFrEF / NICM, OSA on CPAP, Pulmonary HTN, NASH, GERD, IBS, hypothyroidism, DM, depression,& fibromyalgia  admitted 06/05/24 for recurrent VT requiring ATP / VT storm. Quinidine changed to sulfate formulation.   Assessment & Plan    Monomorphic VT  End-Stage HFrEF /  NICM  CRT-D in Situ  -continue post device arm restrictions  -monitor / replace electrolytes as indicated > goal K+>4, Mg+>2 -GDMT limited by extensive allergies > continue Toprol 50 mg BID, torsemide   -await Palliative Care consultation for goals of care  -continue quinidine sulfate 200 mg TID > anticipate she will discharge on this dose -LRL on device 90 bpm   Hypokalemia Hypomagnesemia  -replaced 10/13,  monitor  Hypertension  -well controlled on current regimen   Diarrhea / IBS  Recent Diverticulitis  NASH  -per [facility]   OSA  -per [facility]   DM  -per [facility]    For questions or updates, please contact [facility] Please consult [email or web address] for contact info under     Signed, Daphne Barrack, NP-C, AGACNP-BC [facility] - Electrophysiology  06/06/2024, 7:20 AM

## 2024-06-06 NOTE — Progress Notes (Signed)
 Patient needs BIPAP to sleep, or patient states will get little to no sleep. Called RT at 6:29 pm and they heard Tanya Hogan needs a BIPAP before sleep tonight.

## 2024-06-06 NOTE — Telephone Encounter (Signed)
 Copied from CRM [id number]. Topic: Clinical - Medical Advice >> Jun 06, 2024  2:37 PM Shanda MATSU wrote: Reason for CRM: Emmie w/[facility] [phone] called in to adv that theu have been unable to reach the patient to schedule SN, PT, OT, caller is req a delay in starting services, delay of 10/17 or sooner.

## 2024-06-06 NOTE — ED Notes (Signed)
 Requested medications from pharmacy, will administer medications when they arrive

## 2024-06-06 NOTE — Evaluation (Signed)
 Occupational Therapy Evaluation Patient Details Name: Tanya Hogan MRN: [id number] DOB: [birth year] Today's Date: 06/06/2024   History of Present Illness   Patient is a 73 years old female presented with c/o palpitations,dizziness, no syncope, nausea, diarrhea, belching & symptoms are worse when lying down. Found to have recurrent ventricular tachycardia. Pallative has been consulted. PHMx: recent revision of pacemaker/AICD, breast cancer, chronic heart failure, nonalcoholic cirrhosis, BiV pacemake, depression, DM2, GERD, HTN, hypothyroidism, valvular heart disease, pulmonary hypertension, sleep apnea and ventricular tachy arrhythmia.     Clinical Impressions This 73 yo female admitted with above presents to acute OT with PLOF of being Mod I/independent with basic ADLs, IADLs, and taking care of her brother (also has paid caregiver daily for him). Currently she is back to her PLOF using the RW intermittently as she does at home. No further OT needs, and have made PT aware (via secure chat) that no PT needs were identified. Acute OT will sign off.     If plan is discharge home, recommend the following:   Assist for transportation;Assistance with cooking/housework     Functional Status Assessment   Patient has not had a recent decline in their functional status     Equipment Recommendations   None recommended by OT      Precautions/Restrictions   Precautions Precautions: ICD/Pacemaker Recall of Precautions/Restrictions: Intact Restrictions Weight Bearing Restrictions Per Provider Order: Yes LUE Weight Bearing Per Provider Order: Non weight bearing     Mobility Bed Mobility               General bed mobility comments: pt up in recliner upon arrival    Transfers Overall transfer level: Modified independent Equipment used: Rolling [place] (2 wheels) Transfers: Sit to/from Stand Sit to Stand: Modified independent (Device/Increase time)           General  transfer comment: only used RUE to push up from recliner; pt ambulated around the unit (ED back unit) with RW x2 without any LOB and did the last 20 feet without RW, again without LOB.      Balance Overall balance assessment: Mild deficits observed, not formally tested (feels steadier with RW)                                         ADL either performed or assessed with clinical judgement   ADL Overall ADL's : Independent;Modified independent                                             Vision Patient Visual Report: No change from baseline              Pertinent Vitals/Pain Pain Assessment Pain Assessment: No/denies pain     Extremity/Trunk Assessment Upper Extremity Assessment Upper Extremity Assessment: Overall WFL for tasks assessed           Communication Communication Communication: No apparent difficulties   Cognition Arousal: Alert   Cognition: No apparent impairments                               Following commands: Intact       Cueing  General Comments   Cueing Techniques: Verbal cues  VSS--no tachy cardia during session  Home Living Family/patient expects to be discharged to:: Private residence Living Arrangements: Other relatives Available Help at Discharge: Family;Available PRN/intermittently Type of Home: House Home Access: Ramped entrance     Home Layout: One level     Bathroom Shower/Tub: Tub/shower unit (pt sponges bathes)   Bathroom Toilet: Standard     Home Equipment: Agricultural consultant (2 wheels);Shower seat   Additional Comments: 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; other family has stepped in to help some      Prior Functioning/Environment Prior Level of Function : Independent/Modified Independent             Mobility Comments: Only uses RW when out and about, does some furniture walking at times in the house. ADLs Comments: No longer driving due to heart  issues.            OT Goals(Current goals can be found in the care plan section)   Acute Rehab OT Goals Patient Stated Goal: to be able to get out and about, drive again   OT Frequency:  Min 2X/week       AM-PAC OT 6 Clicks Daily Activity     Outcome Measure Help from another person eating meals?: None Help from another person taking care of personal grooming?: None Help from another person toileting, which includes using toliet, bedpan, or urinal?: None Help from another person bathing (including washing, rinsing, drying)?: None Help from another person to put on and taking off regular upper body clothing?: None Help from another person to put on and taking off regular lower body clothing?: None 6 Click Score: 24   End of Session Equipment Utilized During Treatment: Rolling [place] (2 wheels) Nurse Communication: Mobility status (via secure chat)  Activity Tolerance: Patient tolerated treatment well Patient left: in chair;with call bell/phone within reach  OT Visit Diagnosis: Unsteadiness on feet (R26.81) (mild, intermittent)                Time: 9191-9154 OT Time Calculation (min): 37 min Charges:  OT General Charges $OT Visit: 1 Visit OT Evaluation $OT Eval Moderate Complexity: 1 Mod OT Treatments $Self Care/Home Management : 23-37 mins  Tanya Hogan OT Acute Rehabilitation Services Office [phone]    Tanya Hogan 06/06/2024, 9:08 AM

## 2024-06-06 NOTE — Telephone Encounter (Signed)
 FYI Spoke with Emmie wit [facility] We have verified that the phone number listed is correct She gave patient return call # [phone]  Called and spoke with patient. She is currently in hospital for tachycardia. She will contact our office upon release to schedule with Dr. Alvan  as well as to get the above contact number for scheduling SN, OT and PT .

## 2024-06-06 NOTE — Care Management Obs Status (Signed)
 MEDICARE OBSERVATION STATUS NOTIFICATION   Patient Details  Name: Tanya Hogan MRN: [id number] Date of Birth: 06-26-1951   Medicare Observation Status Notification Given:  Yes    Nena LITTIE Coffee, RN 06/06/2024, 11:20 AM

## 2024-06-06 NOTE — Progress Notes (Signed)
 PROGRESS NOTE    Tanya Hogan  FMW:[id number] DOB: July 13, 1951 DOA: 06/05/2024 PCP: [name], MD    Brief Narrative:   73 year old with history of breast cancer, chronic CHF EF 20%, nonalcoholic cirrhosis, depression, DM2, GERD, HTN, hypothyroidism, sleep apnea, pulmonary hypertension, ventricular tachyarrhythmia comes to the hospital for palpitation.  Recently had undergone pacemaker/AICD revision.  She has recurrent VT requiring ATP.  Seen by EP team, no further options at this time besides optimizing electrolytes and adjusting medication as tolerated otherwise palliative care team has been consulted.  Assessment & Plan:    Recurrent monomorphic ventricular tachycardia Status post recent revision of pacemaker/AICD.  Seen by EP team, unfortunately not many more options available at this time for her besides optimizing electrolytes and her medications.  Palliative care team has been consulted.  For now continue amiodarone , Toprol-XL and quinidine.   Hypokalemia.   Replete as needed   History of refractory chronic heart failure EF 20%, valvular heart disease, pulmonary hypertension with recurrent admissions.   Reduced EF of 20%.  Currently on Imdur , diuretics and Toprol.  Palliative care team is to see the patient   Hypothyroidism. Continue Synthroid .   History of diabetes mellitus type 2 Hold metformin .  Continue sliding scale insulin .  Will continue to monitor   GERD Continue Protonix .   Hypertension Continue metoprolol.  IV as needed     DVT Prophylaxis: Lovenox  subcu     Code Status: Full Code Family Communication:   Ongoing discussions with palliative regarding goals of care   PT Follow up Recs:   Subjective: Seen at bedside and explained to her that her cardiac condition is essentially terminal and did not have much medical options.  She is very tearful Denies any complaints She would like to think about her CODE STATUS   Examination:  General  exam: Appears calm and comfortable  Respiratory system: Clear to auscultation. Respiratory effort normal. Cardiovascular system: S1 & S2 heard, RRR. No JVD, murmurs, rubs, gallops or clicks. No pedal edema. Gastrointestinal system: Abdomen is nondistended, soft and nontender. No organomegaly or masses felt. Normal bowel sounds heard. Central nervous system: Alert and oriented. No focal neurological deficits. Extremities: Symmetric 5 x 5 power. Skin: No rashes, lesions or ulcers Psychiatry: Judgement and insight appear normal. Mood & affect appropriate.                Diet Orders (From admission, onward)     Start     Ordered   06/05/24 1259  Diet heart healthy/carb modified Room service appropriate? Yes; Fluid consistency: Thin  Diet effective now       Question Answer Comment  Diet-HS Snack? Nothing   Room service appropriate? Yes   Fluid consistency: Thin      06/05/24 1300            Objective: Vitals:   06/06/24 0914 06/06/24 1100 06/06/24 1200 06/06/24 1216  BP:  99/66  98/67  Pulse:  88  90  Resp:  (!) 31  20  Temp: 98 F (36.7 C)   97.8 F (36.6 C)  TempSrc: Oral   Oral  SpO2:  100%  95%  Weight:   84.6 kg   Height:   5' (1.524 m)     Intake/Output Summary (Last 24 hours) at 06/06/2024 1242 Last data filed at 06/06/2024 1200 Gross per 24 hour  Intake 236 ml  Output --  Net 236 ml   Filed Weights   06/05/24 1020 06/06/24 1200  Weight: 83 kg 84.6 kg    Scheduled Meds:  amiodarone   200 mg Oral BID   aspirin   81 mg Oral Daily   calcium -vitamin D   2 tablet Oral QHS   enoxaparin  (LOVENOX ) injection  40 mg Subcutaneous Q24H   fluticasone  furoate-vilanterol  1 puff Inhalation Daily   insulin  aspart  0-5 Units Subcutaneous QHS   insulin  aspart  0-9 Units Subcutaneous TID WC   isosorbide  mononitrate  30 mg Oral Daily   levothyroxine   112 mcg Oral Q0600   loratadine   10 mg Oral Daily   lubiprostone  24 mcg Oral Q breakfast   magnesium  oxide   400 mg Oral BID   metoprolol succinate  50 mg Oral BID   multivitamin with minerals  1 tablet Oral Daily   pantoprazole   40 mg Oral BID   pravastatin   40 mg Oral Daily   quiNIDine sulfate  200 mg Oral TID   sodium chloride  flush  3 mL Intravenous Q12H   torsemide   20 mg Oral Daily   umeclidinium bromide   1 puff Inhalation Daily   Continuous Infusions:  sodium chloride       Nutritional status     Body mass index is 36.43 kg/m.  Data Reviewed:   CBC: Recent Labs  Lab 06/01/24 0645 06/05/24 1023 06/06/24 0450  WBC 5.8 6.6 5.8  NEUTROABS  --  4.6  --   HGB 13.0 13.0 12.8  HCT 40.2 40.1 40.5  MCV 98.8 98.8 100.5*  PLT 280 178 194   Basic Metabolic Panel: Recent Labs  Lab 06/01/24 0645 06/05/24 1023 06/06/24 0450  NA 140 137 140  K 3.9 3.5 5.3*  CL 101 102 105  CO2 26 17* 22  GLUCOSE 98 112* 91  BUN 14 24* 19  CREATININE 0.84 1.01* 0.90  CALCIUM  9.9 9.8 9.5  MG 1.7 1.7 2.4   GFR: Estimated Creatinine Clearance: 52.1 mL/min (by C-G formula based on SCr of 0.9 mg/dL). Liver Function Tests: Recent Labs  Lab 06/05/24 1023  AST 41  ALT 28  ALKPHOS 99  BILITOT 0.6  PROT 6.7  ALBUMIN 3.3*   No results for input(s): LIPASE, AMYLASE in the last 168 hours. No results for input(s): AMMONIA in the last 168 hours. Coagulation Profile: No results for input(s): INR, PROTIME in the last 168 hours. Cardiac Enzymes: No results for input(s): CKTOTAL, CKMB, CKMBINDEX, TROPONINI in the last 168 hours. BNP (last 3 results) No results for input(s): PROBNP in the last 8760 hours. HbA1C: Recent Labs    06/05/24 1347  HGBA1C 5.5   CBG: Recent Labs  Lab 06/02/24 1206 06/05/24 1719 06/05/24 2207 06/06/24 0819 06/06/24 1141  GLUCAP 107* 95 102* 93 103*   Lipid Profile: No results for input(s): CHOL, HDL, LDLCALC, TRIG, CHOLHDL, LDLDIRECT in the last 72 hours. Thyroid  Function Tests: Recent Labs    06/06/24 0450  TSH 3.548    Anemia Panel: No results for input(s): VITAMINB12, FOLATE, FERRITIN, TIBC, IRON, RETICCTPCT in the last 72 hours. Sepsis Labs: No results for input(s): PROCALCITON, LATICACIDVEN in the last 168 hours.  Recent Results (from the past 240 hours)  Resp panel by RT-PCR (RSV, Flu A&B, Covid) Anterior Nasal Swab     Status: None   Collection Time: 05/28/24 12:28 PM   Specimen: Anterior Nasal Swab  Result Value Ref Range Status   SARS Coronavirus 2 by RT PCR NEGATIVE NEGATIVE Final    Comment: (NOTE) SARS-CoV-2 target nucleic acids are NOT DETECTED.  The  SARS-CoV-2 RNA is generally detectable in upper respiratory specimens during the acute phase of infection. The lowest concentration of SARS-CoV-2 viral copies this assay can detect is 138 copies/mL. A negative result does not preclude SARS-Cov-2 infection and should not be used as the sole basis for treatment or other patient management decisions. A negative result may occur with  improper specimen collection/handling, submission of specimen other than nasopharyngeal swab, presence of viral mutation(s) within the areas targeted by this assay, and inadequate number of viral copies(<138 copies/mL). A negative result must be combined with clinical observations, patient history, and epidemiological information. The expected result is Negative.  Fact Sheet for Patients:  BloggerCourse.com  Fact Sheet for Healthcare Providers:  SeriousBroker.it  This test is no t yet approved or cleared by the [place]  FDA and  has been authorized for detection and/or diagnosis of SARS-CoV-2 by FDA under an Emergency Use Authorization (EUA). This EUA will remain  in effect (meaning this test can be used) for the duration of the COVID-19 declaration under Section 564(b)(1) of the Act, 21 U.S.C.section 360bbb-3(b)(1), unless the authorization is terminated  or revoked sooner.        Influenza A by PCR NEGATIVE NEGATIVE Final   Influenza B by PCR NEGATIVE NEGATIVE Final    Comment: (NOTE) The Xpert Xpress SARS-CoV-2/FLU/RSV plus assay is intended as an aid in the diagnosis of influenza from Nasopharyngeal swab specimens and should not be used as a sole basis for treatment. Nasal washings and aspirates are unacceptable for Xpert Xpress SARS-CoV-2/FLU/RSV testing.  Fact Sheet for Patients: BloggerCourse.com  Fact Sheet for Healthcare Providers: SeriousBroker.it  This test is not yet approved or cleared by the [place]  FDA and has been authorized for detection and/or diagnosis of SARS-CoV-2 by FDA under an Emergency Use Authorization (EUA). This EUA will remain in effect (meaning this test can be used) for the duration of the COVID-19 declaration under Section 564(b)(1) of the Act, 21 U.S.C. section 360bbb-3(b)(1), unless the authorization is terminated or revoked.     Resp Syncytial Virus by PCR NEGATIVE NEGATIVE Final    Comment: (NOTE) Fact Sheet for Patients: BloggerCourse.com  Fact Sheet for Healthcare Providers: SeriousBroker.it  This test is not yet approved or cleared by the [place]  FDA and has been authorized for detection and/or diagnosis of SARS-CoV-2 by FDA under an Emergency Use Authorization (EUA). This EUA will remain in effect (meaning this test can be used) for the duration of the COVID-19 declaration under Section 564(b)(1) of the Act, 21 U.S.C. section 360bbb-3(b)(1), unless the authorization is terminated or revoked.  Performed at [facility], [address], [place], [place] [address]          Radiology Studies: DG Chest Portable 1 View Result Date: 06/05/2024 CLINICAL DATA:  Chest pain EXAM: PORTABLE CHEST 1 VIEW COMPARISON:  06/01/2024 FINDINGS: The cardio pericardial silhouette is enlarged. The lungs are  clear without focal pneumonia, edema, pneumothorax or pleural effusion. Pulmonary edema seen previously not evident on the current study. Left-sided permanent pacemaker/AICD again noted. Telemetry leads overlie the chest. IMPRESSION: No active disease. Electronically Signed   By: Camellia Candle M.D.   On: 06/05/2024 11:01           LOS: 0 days   Time spent= 35 mins    Burgess JAYSON Dare, MD [facility]  If 7PM-7AM, please contact night-coverage  06/06/2024, 12:42 PM

## 2024-06-06 NOTE — ED Notes (Signed)
 Checked patient cbg it was 53 notified RN of blood sugar patient is now working with PT

## 2024-06-06 NOTE — Plan of Care (Signed)
   Problem: Coping: Goal: Ability to adjust to condition or change in health will improve Outcome: Progressing

## 2024-06-06 NOTE — Progress Notes (Signed)
 PT Cancellation Note  Patient Details Name: Tanya Hogan MRN: [id number] DOB: 23-Sep-1950   Cancelled Treatment:    Reason Eval/Treat Not Completed: OT screened, no needs identified, will sign off (OT reports pt is Mod I without any AD. Pt at baseline functionally. Will sign off at this time. Please re-consult if further needs arise.)  [name], DPT, CLT  Acute Rehabilitation Services Office: [phone] (Secure chat preferred)   [name] 06/06/2024, 9:41 AM

## 2024-06-06 NOTE — Transitions of Care (Post Inpatient/ED Visit) (Signed)
 06/06/2024 Per chart review, patient readmitted 06/05/24. TOC RN will monitor for discharge  Patient ID: Tanya Hogan, female   DOB: October 14, 1950, 73 y.o.   MRN: [id number]  Shona Prow RN, CCM [place]  VBCI-Population Health RN Care Manager [phone]

## 2024-06-07 ENCOUNTER — Other Ambulatory Visit (HOSPITAL_COMMUNITY): Payer: Self-pay

## 2024-06-07 ENCOUNTER — Other Ambulatory Visit: Payer: Self-pay | Admitting: Cardiology

## 2024-06-07 DIAGNOSIS — E785 Hyperlipidemia, unspecified: Secondary | ICD-10-CM | POA: Diagnosis not present

## 2024-06-07 DIAGNOSIS — E038 Other specified hypothyroidism: Secondary | ICD-10-CM | POA: Diagnosis not present

## 2024-06-07 DIAGNOSIS — I5022 Chronic systolic (congestive) heart failure: Secondary | ICD-10-CM

## 2024-06-07 DIAGNOSIS — E1169 Type 2 diabetes mellitus with other specified complication: Secondary | ICD-10-CM | POA: Diagnosis not present

## 2024-06-07 DIAGNOSIS — I10 Essential (primary) hypertension: Secondary | ICD-10-CM | POA: Diagnosis not present

## 2024-06-07 DIAGNOSIS — E876 Hypokalemia: Secondary | ICD-10-CM | POA: Diagnosis not present

## 2024-06-07 DIAGNOSIS — I428 Other cardiomyopathies: Secondary | ICD-10-CM | POA: Diagnosis not present

## 2024-06-07 DIAGNOSIS — K7581 Nonalcoholic steatohepatitis (NASH): Secondary | ICD-10-CM

## 2024-06-07 DIAGNOSIS — I472 Ventricular tachycardia, unspecified: Secondary | ICD-10-CM | POA: Diagnosis not present

## 2024-06-07 DIAGNOSIS — Z7189 Other specified counseling: Secondary | ICD-10-CM | POA: Diagnosis not present

## 2024-06-07 DIAGNOSIS — J439 Emphysema, unspecified: Secondary | ICD-10-CM

## 2024-06-07 DIAGNOSIS — Z515 Encounter for palliative care: Secondary | ICD-10-CM | POA: Diagnosis not present

## 2024-06-07 DIAGNOSIS — Z79899 Other long term (current) drug therapy: Secondary | ICD-10-CM | POA: Diagnosis not present

## 2024-06-07 DIAGNOSIS — Z95810 Presence of automatic (implantable) cardiac defibrillator: Secondary | ICD-10-CM | POA: Diagnosis not present

## 2024-06-07 DIAGNOSIS — E66812 Obesity, class 2: Secondary | ICD-10-CM

## 2024-06-07 LAB — GLUCOSE, CAPILLARY: Glucose-Capillary: 108 mg/dL — ABNORMAL HIGH (ref 70–99)

## 2024-06-07 LAB — BASIC METABOLIC PANEL WITH GFR
Anion gap: 11 (ref 5–15)
BUN: 15 mg/dL (ref 8–23)
CO2: 27 mmol/L (ref 22–32)
Calcium: 9.6 mg/dL (ref 8.9–10.3)
Chloride: 99 mmol/L (ref 98–111)
Creatinine, Ser: 0.96 mg/dL (ref 0.44–1.00)
GFR, Estimated: >60 mL/min (ref >60)
Glucose, Bld: 131 mg/dL — ABNORMAL HIGH (ref 70–99)
Potassium: 4.8 mmol/L (ref 3.5–5.1)
Sodium: 137 mmol/L (ref 135–145)

## 2024-06-07 LAB — MAGNESIUM: Magnesium: 2.1 mg/dL (ref 1.7–2.4)

## 2024-06-07 MED ORDER — POTASSIUM CHLORIDE CRYS ER 10 MEQ PO TBCR: 10.0000 meq | EXTENDED_RELEASE_TABLET | Freq: Every day | ORAL | Status: DC

## 2024-06-07 MED ORDER — SUCRALFATE 1 GM/10ML PO SUSP: 1.0000 g | Freq: Three times a day (TID) | ORAL | Status: DC

## 2024-06-07 MED ORDER — POTASSIUM CHLORIDE CRYS ER 10 MEQ PO TBCR
10.0000 meq | EXTENDED_RELEASE_TABLET | Freq: Every day | ORAL | 0 refills | Status: DC
  Filled 2024-06-07: qty 30, 30d supply, fill #0

## 2024-06-07 MED ORDER — SUCRALFATE 1 GM/10ML PO SUSP: 1.0000 g | Freq: Once | ORAL | Status: DC

## 2024-06-07 MED ORDER — QUINIDINE SULFATE 200 MG PO TABS
200.0000 mg | ORAL_TABLET | Freq: Three times a day (TID) | ORAL | 6 refills | Status: DC
  Filled 2024-06-07 – 2024-06-22 (×2): qty 60, 20d supply, fill #0
  Filled 2024-07-18: qty 60, 20d supply, fill #1

## 2024-06-07 NOTE — Assessment & Plan Note (Signed)
 Continue with levothyroxine

## 2024-06-07 NOTE — Progress Notes (Signed)
  Patient Name: Tanya Hogan Date of Encounter: 06/07/2024  Primary Cardiologist: Redell Shallow, MD Electrophysiologist: None Dr. Waddell / Dr. Almetta  Interval Summary   The patient is doing well today. She asks about when she might go home.   At this time, the patient denies chest pain, shortness of breath, or any new concerns.  Vital Signs    Vitals:   06/06/24 1520 06/06/24 2017 06/06/24 2352 06/07/24 0413  BP: 98/61 104/61 109/70 99/80  Pulse: 89 89 89 90  Resp: 18 18 18 19   Temp: 98 F (36.7 C) 98 F (36.7 C) (!) 97.5 F (36.4 C) 97.7 F (36.5 C)  TempSrc: Oral Axillary Oral Oral  SpO2: 95% 97% 96% 96%  Weight:    85 kg  Height:        Intake/Output Summary (Last 24 hours) at 06/07/2024 0719 Last data filed at 06/07/2024 0413 Gross per 24 hour  Intake 356 ml  Output 200 ml  Net 156 ml   Filed Weights   06/05/24 1020 06/06/24 1200 06/07/24 0413  Weight: 83 kg 84.6 kg 85 kg    Physical Exam    GEN- NAD, Alert and oriented  Lungs- Clear to ausculation bilaterally, normal work of breathing Cardiac- Regular rate and rhythm, no murmurs, rubs or gallops GI- soft, NT, ND, + BS Extremities- no clubbing or cyanosis. No edema  Telemetry    AV paced 80's, rare PVC's (personally reviewed)  Hospital Course    Tanya Hogan is a 73 y.o. female with a PMH of  PVC's/NSVT, VT s/p upgrade to CRT-D, HTN, HFrEF / NICM, OSA on CPAP, Pulmonary HTN, NASH, GERD, IBS, hypothyroidism, DM, depression,& fibromyalgia  admitted 06/05/24 for recurrent VT requiring ATP / VT storm. Quinidine changed to sulfate formulation. VT quiescent with change.    Assessment & Plan    Monomorphic VT  End-Stage HFrEF /  NICM  CRT-D in Situ  -device interrogation on paper chart > normal function, frequent VT episodes, battery ok, LRL of 90  -GDMT limited by extensive allergies > continue Toprol 50 mg BID, torsemide   -appreciate Palliative Care / TRH  -continue quinidine sulfate 200 mg  TID at discharge (signed for d/c to [facility] clinic)  Hypokalemia Hypomagnesemia  -per primary   Hypertension  -per TRH   Diarrhea / IBS  Recent Diverticulitis  NASH -per TRH  -diarrhea resolved    EP will be available PRN. Please call back if new needs arise.     For questions or updates, please contact [facility] Please consult [email or web address] for contact info under     Signed, Daphne Barrack, NP  06/07/2024, 7:19 AM

## 2024-06-07 NOTE — TOC CM/SW Note (Signed)
 Transition of Care [facility]) - Inpatient Brief Assessment   Patient Details  Name: ALEXIA DINGER MRN: [id number] Date of Birth: 1951/01/09  [facility] CM/SW Contact:    Waddell Barnie Rama, RN Phone Number: 06/07/2024, 11:25 AM   Clinical Narrative: From home with brother, has PCP and insurance on file, states has  [facility] services in place with [facility] and would like to continue with them,  NCM confirmed with [facility] and sent referral thru portal.  Has rolling Heninger and a w/chair that is her brothers  at home.  States she will need cab transport  at [employer] and family is support system, states gets medications from [facility] on [address] in [place] and [employer] order.  Pta self ambulatory with [facility].   Patient will resume with [facility] services that was previously set up with [facility].     Transition of Care Asessment: Insurance and Status: Insurance coverage has been reviewed Patient has primary care physician: Yes Home environment has been reviewed: home with brother Prior level of function:: ambulatory with [facility] Prior/Current Home Services: Current home services ([facility], w/chair which is her brothers) Social Drivers of Health Review: SDOH reviewed no interventions necessary Readmission risk has been reviewed: Yes Transition of care needs: transition of care needs identified, TOC will continue to follow

## 2024-06-07 NOTE — Progress Notes (Signed)
 CSW met patient at bedside about consult placed by Palliative. CSW addressed patients financial concerns about her hospital bill. CSW advised patient to reach out to financial counseling. CSW provided other community resources as well.   Jahnaya Branscome, MSW, LCSWA Transitions of Care [phone]'

## 2024-06-07 NOTE — Progress Notes (Signed)
 Daily Progress Note   Patient Name: Tanya Hogan       Date: 06/07/2024 DOB: 1951/05/21  Age: 73 y.o. MRN#: [id number] Attending Physician: Noralee Elidia Toribio DEWAINE Primary Care Physician: [name], MD Admit Date: 06/05/2024  Reason for Consultation/Follow-up: Establishing goals of care  Subjective: Medical records reviewed including progress notes, labs and imaging. Patient assessed at the bedside. She reports fatigue. No visitors were present.   Created space and opportunity for patient's thoughts and feelings on her current illness. She has discussed palliative care with her daughter, though she thought her daughter had more of an idea of what that means. I re-introduced role of palliative medicine in navigating treatment options, advance care planning and support for patients and families facing serious illness. I encouraged continued conversations about her care preferences with family and offered outpatient palliative care referral. She declines for now, citing how she is very overwhelmed with visits with other providers and sorting through her change in her ability to care for her brother. Reassured that any provider could assist with this referral if she would like to consider later on. She confirms the plan for discharge today and does not know if she will be able to get home. Inquiring about transportation. I offered to reach out to discussed with TOC. Spoke with NCM who then stated that son would be available to pick her up.  Questions and concerns addressed. PMT will continue to support holistically.   Length of Stay: 0   Physical Exam Vitals and nursing note reviewed.  Constitutional:      General: She is not in acute distress. HENT:     Head: Normocephalic and atraumatic.  Cardiovascular:     Rate and Rhythm:  Normal rate.  Pulmonary:     Effort: Pulmonary effort is normal.  Skin:    General: Skin is warm and dry.  Neurological:     Mental Status: She is alert and oriented to person, place, and time.  Psychiatric:        Behavior: Behavior normal.            Vital Signs: BP 110/72 (BP Location: Right Arm)   Pulse 89   Temp 97.8 F (36.6 C) (Oral)   Resp 18   Ht 5' (1.524 m) Comment: pt says she is 5', usually says 5'1, but recgonizes has lost some height.  Wt 85 kg   SpO2 97%   BMI 36.58 kg/m  SpO2: SpO2: 97 % O2 Device: O2 Device: Room Air O2 Flow Rate:        Palliative Care Assessment & Plan  Patient Profile: 73 y.o. female  with past medical history of breast cancer, chronic heart failure with latest EF of 20%, nonalcoholic cirrhosis, depression, diabetes mellitus type 2, GERD, hypertension, hypothyroidism, IBS, valvular heart disease, pulmonary hypertension, sleep apnea and ventricular tachyarrhythmia admitted on 06/05/2024 with palpitations/heart fluttering.    Patient is s/p recent AICD upgrade/revision. She noted that she experienced flare of IBS symptoms (constipation) just prior to arrhythmia which occurring during the other episode as well. She stated that she took nausea medication and laxative and then shortly after she noted palpitations. In the ED, EP provider evaluated and GDMT limited to current medication regimen.   Patient has had 3 admissions in the past 6 months. PMT has been consulted to assist with goals of care conversation.  Assessment: Goals of care conversation Ventricular tachycardia  NASH COPD Chronic systolic CHF  Recommendations/Plan: Continue full scope Continue current care plan Patient declines outpatient palliative care follow up Discussed patient's transportation questions with TOC PMT remains available as needed   Prognosis: Concerning with lack of long-term treatment options for acute on chronic illnesses, gradual functional decline    Discharge Planning: Home with Home Health  Care plan was discussed with patient, TOC    Karlin Binion SHAUNNA Fell, PA-C  Palliative Medicine Team Team phone # [phone]  Thank you for allowing the Palliative Medicine Team to assist in the care of this patient. Please utilize secure chat with additional questions, if there is no response within 30 minutes please call the above phone number.  Palliative Medicine Team providers are available by phone from 7am to 7pm daily and can be reached through the team cell phone.  Should this patient require assistance outside of these hours, please call the patient's attending physician.     Time Total: 35  Visit consisted of counseling and education dealing with the complex and emotionally intense issues of symptom management and palliative care in the setting of serious and potentially life-threatening illness. Greater than 50% of this time was spent counseling and coordinating care related to the above assessment and plan.  Personally spent 35 minutes in patient care including extensive chart review (labs, imaging, progress/consult notes, vital signs), medically appropraite exam, discussed with treatment team, education to patient, family, and staff, documenting clinical information, medication review and management, coordination of care, and available advanced directive documents.

## 2024-06-07 NOTE — TOC Transition Note (Signed)
 Transition of Care [facility]) - Discharge Note   Patient Details  Name: Tanya Hogan MRN: [id number] Date of Birth: September 24, 1950  [facility] CM/SW Contact:  Waddell Barnie Rama, RN Phone Number: 06/07/2024, 11:29 AM   Clinical Narrative:    For dc home , she will need cab transport home at dc.  NCM notified Kelly with [facility] of dc today.          Patient Goals and CMS Choice            Discharge Placement                       Discharge Plan and Services Additional resources added to the After Visit Summary for                                       Social Drivers of Health (SDOH) Interventions SDOH Screenings   Food Insecurity: No Food Insecurity (06/06/2024)  Housing: Low Risk  (06/06/2024)  Transportation Needs: Unmet Transportation Needs (06/06/2024)  Utilities: At Risk (06/06/2024)  Alcohol Screen: Low Risk  (08/10/2023)  Depression (PHQ2-9): Low Risk  (02/18/2024)  Financial Resource Strain: Medium Risk (08/10/2023)  Physical Activity: Sufficiently Active (08/10/2023)  Recent Concern: Physical Activity - Insufficiently Active (07/12/2023)  Social Connections: Moderately Integrated (06/06/2024)  Stress: No Stress Concern Present (05/08/2024)   Received from [facility]  Tobacco Use: Medium Risk (06/06/2024)  Health Literacy: Adequate Health Literacy (08/10/2023)     Readmission Risk Interventions     No data to display

## 2024-06-07 NOTE — Assessment & Plan Note (Signed)
 No signs of acute exacerbation  Plan to continue bronchodilator therapy

## 2024-06-07 NOTE — Assessment & Plan Note (Signed)
 Renal function stable at discharge with serum cr at 0,96, K is 4.8 and serum bicarbonate at 2.1   Plan to continue torsemide  at home along with K supplements.  Follow up renal function and electrolytes as outpatient. Keep K at 4 and Mg at 2.

## 2024-06-07 NOTE — Progress Notes (Signed)
 Heart Failure Navigator Progress Note  Assessed for Heart & Vascular TOC clinic readiness.  Patient does not meet criteria due to she is a Advanced Heart Failure Team patient of Dr. Gardenia. .   Navigator will sign off at this time.   Stephane Haddock, BSN, Scientist, clinical (histocompatibility and immunogenetics) Only

## 2024-06-07 NOTE — Assessment & Plan Note (Addendum)
 Echocardiogram with reduced LV systolic function with EF 20 to 25%, global hypokinesis, moderate dilatation of LV cavity, grade II diastolic dysfunction with pseudo normalization EA, RV systolic function preserved, RVSP 64,6 mmHg, LA and RA with severe dilatation. Mild to moderate tricuspid valve regurgitation,   No signs of decompensation.  Limited medical therapy due to extensive allergies.  Plan to continue metoprolol succinate and diuresis with torsemide .  Afterload reduction with isosorbide .  Follow up as outpatient.

## 2024-06-07 NOTE — Assessment & Plan Note (Signed)
 Calculated BMI is 36.5

## 2024-06-07 NOTE — Assessment & Plan Note (Signed)
 Continue statin, glucose control for diabetes mellitus and close follow up as outpatient.

## 2024-06-07 NOTE — Assessment & Plan Note (Signed)
 Patient with no further sustained ventricular tachycardias.  Plan to continue quinidine 200 mg tid, amiodarone  200 mg bid and metoprolol succinate.  Keep K and Mg at 4 and 2 respectively. Follow up as outpatient.  Palliative care was consulted.

## 2024-06-07 NOTE — Plan of Care (Signed)
 Problem: Education: Goal: Ability to describe self-care measures that may prevent or decrease complications (Diabetes Survival Skills Education) will improve 06/07/2024 0954 by Margaretann Deidre CROME, RN Outcome: Progressing 06/07/2024 0953 by Margaretann Deidre CROME, RN Outcome: Progressing Goal: Individualized Educational Video(s) 06/07/2024 0954 by Margaretann Deidre CROME, RN Outcome: Progressing 06/07/2024 0953 by Margaretann Deidre CROME, RN Outcome: Progressing   Problem: Coping: Goal: Ability to adjust to condition or change in health will improve 06/07/2024 0954 by Margaretann Deidre CROME, RN Outcome: Progressing 06/07/2024 0953 by Margaretann Deidre CROME, RN Outcome: Progressing   Problem: Fluid Volume: Goal: Ability to maintain a balanced intake and output will improve 06/07/2024 0954 by Margaretann Deidre CROME, RN Outcome: Progressing 06/07/2024 0953 by Margaretann Deidre CROME, RN Outcome: Progressing   Problem: Health Behavior/Discharge Planning: Goal: Ability to identify and utilize available resources and services will improve 06/07/2024 0954 by Margaretann Deidre CROME, RN Outcome: Progressing 06/07/2024 0953 by Margaretann Deidre CROME, RN Outcome: Progressing Goal: Ability to manage health-related needs will improve 06/07/2024 0954 by Margaretann Deidre CROME, RN Outcome: Progressing 06/07/2024 0953 by Margaretann Deidre CROME, RN Outcome: Progressing   Problem: Metabolic: Goal: Ability to maintain appropriate glucose levels will improve 06/07/2024 0954 by Margaretann Deidre CROME, RN Outcome: Progressing 06/07/2024 0953 by Margaretann Deidre CROME, RN Outcome: Progressing   Problem: Nutritional: Goal: Maintenance of adequate nutrition will improve 06/07/2024 0954 by Margaretann Deidre CROME, RN Outcome: Progressing 06/07/2024 0953 by Margaretann Deidre CROME, RN Outcome: Progressing Goal: Progress toward achieving an optimal weight will improve 06/07/2024 0954 by Margaretann Deidre CROME, RN Outcome: Progressing 06/07/2024 0953 by Margaretann Deidre CROME, RN Outcome:  Progressing   Problem: Skin Integrity: Goal: Risk for impaired skin integrity will decrease 06/07/2024 0954 by Margaretann Deidre CROME, RN Outcome: Progressing 06/07/2024 0953 by Margaretann Deidre CROME, RN Outcome: Progressing   Problem: Tissue Perfusion: Goal: Adequacy of tissue perfusion will improve 06/07/2024 0954 by Margaretann Deidre CROME, RN Outcome: Progressing 06/07/2024 0953 by Margaretann Deidre CROME, RN Outcome: Progressing   Problem: Education: Goal: Knowledge of General Education information will improve Description: Including pain rating scale, medication(s)/side effects and non-pharmacologic comfort measures 06/07/2024 0954 by Margaretann Deidre CROME, RN Outcome: Progressing 06/07/2024 0953 by Margaretann Deidre CROME, RN Outcome: Progressing   Problem: Health Behavior/Discharge Planning: Goal: Ability to manage health-related needs will improve 06/07/2024 0954 by Margaretann Deidre CROME, RN Outcome: Progressing 06/07/2024 0953 by Margaretann Deidre CROME, RN Outcome: Progressing   Problem: Clinical Measurements: Goal: Ability to maintain clinical measurements within normal limits will improve 06/07/2024 0954 by Margaretann Deidre CROME, RN Outcome: Progressing 06/07/2024 0953 by Margaretann Deidre CROME, RN Outcome: Progressing Goal: Will remain free from infection 06/07/2024 0954 by Margaretann Deidre CROME, RN Outcome: Progressing 06/07/2024 0953 by Margaretann Deidre CROME, RN Outcome: Progressing Goal: Diagnostic test results will improve 06/07/2024 0954 by Margaretann Deidre CROME, RN Outcome: Progressing 06/07/2024 0953 by Margaretann Deidre CROME, RN Outcome: Progressing Goal: Respiratory complications will improve 06/07/2024 0954 by Margaretann Deidre CROME, RN Outcome: Progressing 06/07/2024 0953 by Margaretann Deidre CROME, RN Outcome: Progressing Goal: Cardiovascular complication will be avoided 06/07/2024 0954 by Margaretann Deidre CROME, RN Outcome: Progressing 06/07/2024 0953 by Margaretann Deidre CROME, RN Outcome: Progressing   Problem: Activity: Goal: Risk  for activity intolerance will decrease 06/07/2024 0954 by Margaretann Deidre CROME, RN Outcome: Progressing 06/07/2024 0953 by Margaretann Deidre CROME, RN Outcome: Progressing   Problem: Nutrition: Goal: Adequate nutrition will be maintained 06/07/2024 0954 by Margaretann Deidre CROME, RN Outcome: Progressing 06/07/2024 0953 by Margaretann Deidre CROME, RN Outcome: Progressing  Problem: Coping: Goal: Level of anxiety will decrease 06/07/2024 0954 by Margaretann Deidre CROME, RN Outcome: Progressing 06/07/2024 0953 by Margaretann Deidre CROME, RN Outcome: Progressing   Problem: Elimination: Goal: Will not experience complications related to bowel motility 06/07/2024 0954 by Margaretann Deidre CROME, RN Outcome: Progressing 06/07/2024 0953 by Margaretann Deidre CROME, RN Outcome: Progressing Goal: Will not experience complications related to urinary retention 06/07/2024 0954 by Margaretann Deidre CROME, RN Outcome: Progressing 06/07/2024 0953 by Margaretann Deidre CROME, RN Outcome: Progressing   Problem: Pain Managment: Goal: General experience of comfort will improve and/or be controlled 06/07/2024 0954 by Margaretann Deidre CROME, RN Outcome: Progressing 06/07/2024 0953 by Margaretann Deidre CROME, RN Outcome: Progressing   Problem: Safety: Goal: Ability to remain free from injury will improve 06/07/2024 0954 by Margaretann Deidre CROME, RN Outcome: Progressing 06/07/2024 0953 by Margaretann Deidre CROME, RN Outcome: Progressing   Problem: Skin Integrity: Goal: Risk for impaired skin integrity will decrease 06/07/2024 0954 by Margaretann Deidre CROME, RN Outcome: Progressing 06/07/2024 0953 by Margaretann Deidre CROME, RN Outcome: Progressing

## 2024-06-07 NOTE — Discharge Summary (Signed)
 Physician Discharge Summary   Patient: Tanya Hogan MRN: [id number] DOB: 1951-08-16  Admit date:     06/05/2024  Discharge date: 06/07/24  Discharge Physician: Elidia Sieving Erionna Strum   PCP: [name], MD   Recommendations at discharge:   Quinidine has been increased to 200 mg tid, continue amiodarone  200 mg bid and metoprolol succinate 50 mg po daily.  Added KCl supplements 10 meq daily, follow up renal function and electrolytes as outpatient in 7 days.  Limited guideline directed medical therapy for heart failure due to multiple allergies.  Follow up with Dr Alvan in 7 to 10 days Follow up with Cardiology as scheduled.   Discharge Diagnoses: Principal Problem:   Ventricular tachycardia (HCC) Active Problems:   Chronic systolic heart failure (HCC)   Type 2 diabetes mellitus with hyperlipidemia (HCC)   Essential hypertension   Hypothyroidism   NASH (nonalcoholic steatohepatitis)   COPD (chronic obstructive pulmonary disease) (HCC)   Obesity, class 2  Resolved Problems:   * No resolved hospital problems. [facility] Course: Tanya Hogan was admitted to the hospital with the working diagnosis of ventricular tachycardia.   73 year old with history of breast cancer, chronic CHF EF 20%, nonalcoholic cirrhosis, depression, DM2, GERD, HTN, hypothyroidism, sleep apnea, pulmonary hypertension, ventricular tachyarrhythmia who presented with palpitations.  Recent hospitalization 10/5 to 06/02/24 for upgrade Med[employer] CRT-P to a CRAT D systemic in addition of RV lead. She was discharged on amiodarone  200 mg bid and quinidine 324 mg bid.  On the day of current hospitalization, she was at home, when suddenly had chest pain with palpitations. It occurred around 2 am, severe enough that EMS was activated. She was found having ventricular tachycardia for 10 to 15 seconds, she was placed on lidocaine  drip after 80 mg bolus. Then transported to the ED.  On her initial physical  examination her blood pressure was 121/64, HR 90, RR 24 and 02 saturation 99% Lungs with no wheezing or rhonchi, heart with S1 and S2 present and regular with no gallops, rubs or murmurs, abdomen with no distention and no lower extremity edema.   Na 137, K 3.5 Cl 102 bicarbonate 17 glucose 112, bun 24 cr 1,01 Mg 1,7  AST 41 ALT 28  BNP 1,051  High sensitive troponin 20  Wbc 6,6 hgb 13,0 plt 178  TSH 4.5   Chest radiograph with cardiomegaly, bilateral hilar vascular congestion, no effusions or infiltrates. Pacemaker defibrillator in place with one right atrial lead and biventricular leads.   EKG 89 bpm, normal axis, qtc 567, right bundle branch block, atrial and ventricular pacing with no significant ST segment or T wave changes.   Device interrogation demonstrated episodes of ventricular tachycardia treated with ATP. Several of the ATP schemes were not successful. EP reprogrammed the device to deliver to intrinsic ATP schemes followed by burst ATP with longer pulse sequences. She was still pacing at a base rate of 90 bpm.   Quinidine dose was increased to 200 mg tid.  Telemetry with no further ventricular tachycardia.  Electrolytes were corrected.   10/15 patient hemodynamically stable, will follow up as outpatient.   Assessment and Plan: * Ventricular tachycardia [facility]) Patient with no further sustained ventricular tachycardias.  Plan to continue quinidine 200 mg tid, amiodarone  200 mg bid and metoprolol succinate.  Keep K and Mg at 4 and 2 respectively. Follow up as outpatient.  Palliative care was consulted.   Chronic systolic heart failure (HCC) Echocardiogram with reduced LV systolic function with EF  20 to 25%, global hypokinesis, moderate dilatation of LV cavity, grade II diastolic dysfunction with pseudo normalization EA, RV systolic function preserved, RVSP 64,6 mmHg, LA and RA with severe dilatation. Mild to moderate tricuspid valve regurgitation,   No signs of  decompensation.  Limited medical therapy due to extensive allergies.  Plan to continue metoprolol succinate and diuresis with torsemide .  Afterload reduction with isosorbide .  Follow up as outpatient.   Essential hypertension Continue blood pressure control with metoprolol.  Follow up as outpatient.   Type 2 diabetes mellitus with hyperlipidemia (HCC) Patient was placed on insulin  sliding scale for glucose cover and monitoring.  Glucose remained stable.   Plan to continue statin therapy.   Hypokalemia Renal function stable at discharge with serum cr at 0,96, K is 4.8 and serum bicarbonate at 2.1   Plan to continue torsemide  at home along with K supplements.  Follow up renal function and electrolytes as outpatient. Keep K at 4 and Mg at 2.   NASH (nonalcoholic steatohepatitis) Continue statin, glucose control for diabetes mellitus and close follow up as outpatient.   COPD (chronic obstructive pulmonary disease) (HCC) No signs of acute exacerbation  Plan to continue bronchodilator therapy  Obesity, class 2 Calculated BMI is 36.5   Hypothyroidism Continue with levothyroxine .        Consultants: EP  Procedures performed: none   Disposition: Home Diet recommendation:  Cardiac and Carb modified diet DISCHARGE MEDICATION: Allergies as of 06/07/2024       Reactions   Injectafer [ferric Carboxymaltose] Shortness Of Breath   Mild SOB following injectafer infusion   Penicillins Anaphylaxis, Rash   Accupril [quinapril Hcl] Other (See Comments)   Hyperkalemia=high potassium level   Celebrex [celecoxib] Other (See Comments)   Hx hematemesis, bleeding ulcer   Cozaar  [losartan  Potassium] Itching   Dml Forte Rash   Entresto [sacubitril-valsartan] Other (See Comments)   Hyperkalemia   Flonase  [fluticasone ] Other (See Comments)   Hx nasal ulcer   Lactose Intolerance (gi) Diarrhea   Lipitor [atorvastatin ] Other (See Comments)   Myalgias    Nasonex  [mometasone  Furoate]  Other (See Comments)   Epistaxis    Nsaids Other (See Comments)   Hx hematemesis, bleeding ulcer   Advil [ibuprofen] Nausea And Vomiting   Hx hematemesis, bleeding ulcer   Baking Soda-fluoride [sodium Fluoride] Itching, Rash   Bayer Aspirin  [aspirin ] Other (See Comments)   Hx hematemesis, bleeding ulcer   Chamomile Hives   Clindamycin /lincomycin Rash   Codeine Nausea And Vomiting   Lasix  [furosemide ] Other (See Comments)   Tinnitus  Hypotension   Miralax  [polyethylene Glycol] Other (See Comments)   Leaky gut syndrome   Quinine Rash   Reclast  [zoledronic  Acid] Rash, Other (See Comments)   Tremors    Rifadin [rifampin] Other (See Comments)   Unknown reaction   Singulair  [montelukast ] Other (See Comments)   Dizziness    Sulfa Antibiotics Rash   Ultram  [tramadol ] Anxiety, Rash   Vesicare  [solifenacin ] Other (See Comments)   Urinary retention   Wellbutrin  [bupropion ] Other (See Comments)   Myalgias  Skin crawling sensation   Wool Alcohol [lanolin] Rash        Medication List     STOP taking these medications    quiniDINE gluconate 324 MG CR tablet       TAKE these medications    acetaminophen  325 MG tablet Commonly known as: TYLENOL  Take 325 mg by mouth every 6 (six) hours as needed for mild pain (pain score 1-3).   albuterol  (  2.5 MG/3ML) 0.083% nebulizer solution Commonly known as: PROVENTIL  Take 2.5 mg by nebulization every 6 (six) hours as needed for wheezing or shortness of breath.   albuterol  108 (90 Base) MCG/ACT inhaler Commonly known as: VENTOLIN  HFA Inhale 2 puffs into the lungs every 6 (six) hours as needed for wheezing or shortness of breath.   alendronate  70 MG tablet Commonly known as: FOSAMAX  TAKE 1 TABLET BY MOUTH EVERY 7  DAYS WITH FULL GLASS OF WATER  ON AN EMPTY STOMACH What changed: See the new instructions.   amiodarone  200 MG tablet Commonly known as: PACERONE  Take 1 tablet (200 mg total) by mouth 2 (two) times daily.   aspirin  81 MG  chewable tablet Chew 81 mg by mouth daily.   Calcium  600+D Plus Minerals 600-400 MG-UNIT Tabs 1 tab p.o. twice daily   EPINEPHrine  0.3 mg/0.3 mL Soaj injection Commonly known as: EPI-PEN Inject 0.3 mg into the muscle as needed for anaphylaxis.   estradiol 0.1 MG/GM vaginal cream Commonly known as: ESTRACE Place 1 Applicatorful vaginally 3 (three) times a week. What changed: when to take this   fexofenadine  180 MG tablet Commonly known as: ALLEGRA  Take 180 mg by mouth daily.   FIBER PO Take 1-2 capsules by mouth See admin instructions. Take 1 capsule by mouth daily. May increase to 1 capsule before and after each meal - up to 6 capsules daily if needed for constipation.   FreeStyle Libre 3 Reader [employer] Use for continuous glucose monitoring   FreeStyle Libre 3 Sensor Misc by Does not apply route. Change every 14 days   Gemtesa 75 MG Tabs Generic drug: Vibegron Take 75 mg by mouth daily.   glucose blood test strip To be used to check blood glucose Dx:E11.8 FreeStyle Precision blood glucose for Freestyle Libre 2   isosorbide  mononitrate 30 MG 24 hr tablet Commonly known as: IMDUR  TAKE 1 TABLET BY MOUTH DAILY   levothyroxine  112 MCG tablet Commonly known as: SYNTHROID  TAKE 1 TABLET BY MOUTH DAILY   lubiprostone 24 MCG capsule Commonly known as: AMITIZA Take 24 mcg by mouth 2 (two) times daily with a meal. Patient reports provider has decreased to every three days. What changed:  when to take this additional instructions   metFORMIN  500 MG 24 hr tablet Commonly known as: GLUCOPHAGE -XR TAKE 1 TABLET BY MOUTH TWICE  DAILY WITH MEALS   metoprolol succinate 50 MG 24 hr tablet Commonly known as: TOPROL-XL Take 1 tablet (50 mg total) by mouth 2 (two) times daily. Take with or immediately following a meal.   Multivitamin Women 50+ Tabs Take 1 tablet by mouth daily.   pantoprazole  40 MG tablet Commonly known as: PROTONIX  TAKE 1 TABLET BY MOUTH DAILY  BEFORE  BREAKFAST What changed: when to take this   potassium chloride  10 MEQ tablet Commonly known as: KLOR-CON  M Take 1 tablet (10 mEq total) by mouth daily. Start taking on: June 08, 2024   pravastatin  40 MG tablet Commonly known as: PRAVACHOL  Take 40 mg by mouth daily.   PROBIOTIC PO Take 1 capsule by mouth daily.   quiNIDine sulfate 200 MG tablet Take 1 tablet (200 mg total) by mouth 3 (three) times daily.   Spiriva  Respimat 1.25 MCG/ACT Aers Generic drug: Tiotropium Bromide  Inhale 1 puff into the lungs daily.   torsemide  20 MG tablet Commonly known as: DEMADEX  TAKE 1 TABLET BY MOUTH DAILY   triamcinolone  ointment 0.5 % Commonly known as: KENALOG  Apply 1 Application topically 2 (two) times daily as  needed (skin irritation).   valACYclovir  1000 MG tablet Commonly known as: VALTREX  Take 1 tablet (1,000 mg total) by mouth 2 (two) times daily. What changed:  when to take this reasons to take this   Wixela Inhub  250-50 MCG/ACT Aepb Generic drug: fluticasone -salmeterol Inhale 1 puff into the lungs in the morning and at bedtime.        Discharge Exam: Filed Weights   06/05/24 1020 06/06/24 1200 06/07/24 0413  Weight: 83 kg 84.6 kg 85 kg   BP 110/72 (BP Location: Right Arm)   Pulse 89   Temp 97.8 F (36.6 C) (Oral)   Resp 18   Ht 5' (1.524 m) Comment: pt says she is 5', usually says 5'1, but recgonizes has lost some height.  Wt 85 kg   SpO2 97%   BMI 36.58 kg/m   Patient with no chest pain or palpitations, no PND, orthopnea or lower extremity edema.   Neurology awake and alert ENT with mild pallor with no icterus Cardiovascular with S1 and S2 present and regular with no gallops, rubs, positive systolic murmur at the left lower sternal border.  No JVD Respiratory with no wheezing or rhonchi, no rales, mild prolonged expiratory phase Abdomen protuberant, not distended and no tender No lower extremity edema   Condition at discharge: stable  The results  of significant diagnostics from this hospitalization (including imaging, microbiology, ancillary and laboratory) are listed below for reference.   Imaging Studies: DG Chest Portable 1 View Result Date: 06/05/2024 CLINICAL DATA:  Chest pain EXAM: PORTABLE CHEST 1 VIEW COMPARISON:  06/01/2024 FINDINGS: The cardio pericardial silhouette is enlarged. The lungs are clear without focal pneumonia, edema, pneumothorax or pleural effusion. Pulmonary edema seen previously not evident on the current study. Left-sided permanent pacemaker/AICD again noted. Telemetry leads overlie the chest. IMPRESSION: No active disease. Electronically Signed   By: Camellia Candle M.D.   On: 06/05/2024 11:01   EP PPM/ICD IMPLANT Result Date: 06/01/2024 Recommendations: 1. Routine post-procedure care with bedrest for 3 hours 2. No heparin  (IV or subcutaneous) for 48 hours. No enoxaparin  (IV or subcutaneous) for 7 days. 3. PA/lateral CXR in AM 4. Wound check in AM 5. Device interrogation in AM 6. NPO at midnight  [name], MD [facility] Cardiac Electrophysiology   DG Chest 2 View Result Date: 06/01/2024 CLINICAL DATA:  Pacemaker placement. EXAM: CHEST - 2 VIEW COMPARISON:  Radiograph 05/28/2024 FINDINGS: Multi lead pacemaker in place. No pneumothorax. Cardiomegaly is stable. Mediastinal contours are unchanged. Mild pulmonary edema persists. No confluent opacity. No pleural effusion. Right shoulder arthroplasty. IMPRESSION: 1. Multi lead pacemaker in place. No pneumothorax. 2. Stable cardiomegaly and mild pulmonary edema. Electronically Signed   By: Andrea Gasman M.D.   On: 06/01/2024 11:37   DG Chest Portable 1 View Result Date: 05/28/2024 CLINICAL DATA:  fatigue, SOB, near syncope EXAM: PORTABLE CHEST 1 VIEW COMPARISON:  May 12, 2024 FINDINGS: The cardiomediastinal silhouette is unchanged and enlarged in contour.LEFT chest cardiac pacing device. No pleural effusion. No pneumothorax. Mildly increased  reticular prominence diffusely. Mild peribronchial cuffing. IMPRESSION: Constellation of findings are favored to reflect mild pulmonary edema. Electronically Signed   By: Corean Salter M.D.   On: 05/28/2024 11:58   CARDIAC CATHETERIZATION Result Date: 05/18/2024 1.  Normal right dominant circulation with no obstructive coronary artery disease. 2.  Venography from the left axillary demonstrated that the subclavian vein proximal to the insertion site of multiple leads is patent however a wire and catheter  could not cross the this confluence. 3.  Fick cardiac output of 5.7 L/min and Fick cardiac index of 3.1 L/min/m with following hemodynamics:  Right atrial pressure mean of 8 mmHg  Right ventricular pressure 52/1 with an end-diastolic pressure of 10 mmHg  Wedge pressure mean of 23 mmHg with V waves to 29 mmHg  PA pressure of 51/27 with mean of 37 mmHg  PVR of PVR of 2.5 Woods units  PA pulsatility index of 3 4.  LVEDP of 21 to 24 mmHg across the cardiac cycle Recommendation: Medical therapy   PERIPHERAL VASCULAR CATHETERIZATION Result Date: 05/18/2024 1.  Normal right dominant circulation with no obstructive coronary artery disease. 2.  Venography from the left axillary demonstrated that the subclavian vein proximal to the insertion site of multiple leads is patent however a wire and catheter could not cross the this confluence. 3.  Fick cardiac output of 5.7 L/min and Fick cardiac index of 3.1 L/min/m with following hemodynamics:  Right atrial pressure mean of 8 mmHg  Right ventricular pressure 52/1 with an end-diastolic pressure of 10 mmHg  Wedge pressure mean of 23 mmHg with V waves to 29 mmHg  PA pressure of 51/27 with mean of 37 mmHg  PVR of PVR of 2.5 Woods units  PA pulsatility index of 3 4.  LVEDP of 21 to 24 mmHg across the cardiac cycle Recommendation: Medical therapy   ECHOCARDIOGRAM COMPLETE Result Date: 05/13/2024    ECHOCARDIOGRAM REPORT   Patient Name:   HAIZLEY CANNELLA Date of Exam:  05/13/2024 Medical Rec #:  [id number]       Height:       61.0 in Accession #:    [id number]      Weight:       194.0 lb Date of Birth:  14-Sep-1950        BSA:          1.864 m Patient Age:    73 years        BP:           101/71 mmHg Patient Gender: F               HR:           80 bpm. Exam Location:  Inpatient Procedure: 2D Echo and Intracardiac Opacification Agent (Both Spectral and Color            Flow Doppler were utilized during procedure). Indications:    Atrial Flutter  History:        Patient has prior history of Echocardiogram examinations. CHF;                 Risk Factors:Hypertension.  Sonographer:    Charmaine Gaskins Referring Phys: [id number] NKIRU C OSUDE IMPRESSIONS  1. No apical thrombus with Definity  contrast. Left ventricular ejection fraction, by estimation, is 20 to 25%. Left ventricular ejection fraction by 2D MOD biplane is 25.1 %. The left ventricle has severely decreased function. The left ventricle demonstrates global hypokinesis. The left ventricular internal cavity size was moderately dilated. Left ventricular diastolic parameters are consistent with Grade II diastolic dysfunction (pseudonormalization). Elevated left ventricular end-diastolic pressure.  2. Right ventricular systolic function is normal. The right ventricular size is moderately enlarged. There is severely elevated pulmonary artery systolic pressure. The estimated right ventricular systolic pressure is 64.6 mmHg.  3. Left atrial size was severely dilated.  4. Right atrial size was severely dilated.  5. The mitral valve is degenerative. Mild mitral valve regurgitation.  Moderate mitral annular calcification.  6. The tricuspid valve is abnormal. Tricuspid valve regurgitation is mild to moderate.  7. The aortic valve is tricuspid. Aortic valve regurgitation is not visualized. Aortic valve sclerosis/calcification is present, without any evidence of aortic stenosis.  8. The inferior vena cava is dilated in size with <50% respiratory  variability, suggesting right atrial pressure of 15 mmHg. Comparison(s): Changes from prior study are noted. 10/30/2022: LVEF 20-25%, global HK. FINDINGS  Left Ventricle: No apical thrombus with Definity  contrast. Left ventricular ejection fraction, by estimation, is 20 to 25%. Left ventricular ejection fraction by 2D MOD biplane is 25.1 %. The left ventricle has severely decreased function. The left ventricle demonstrates global hypokinesis. Definity  contrast agent was given IV to delineate the left ventricular endocardial borders. The left ventricular internal cavity size was moderately dilated. There is no left ventricular hypertrophy. Left ventricular diastolic parameters are consistent with Grade II diastolic dysfunction (pseudonormalization). Elevated left ventricular end-diastolic pressure. Right Ventricle: The right ventricular size is moderately enlarged. No increase in right ventricular wall thickness. Right ventricular systolic function is normal. There is severely elevated pulmonary artery systolic pressure. The tricuspid regurgitant velocity is 3.52 m/s, and with an assumed right atrial pressure of 15 mmHg, the estimated right ventricular systolic pressure is 64.6 mmHg. Left Atrium: Left atrial size was severely dilated. Right Atrium: Right atrial size was severely dilated. Pericardium: There is no evidence of pericardial effusion. Mitral Valve: The mitral valve is degenerative in appearance. There is mild calcification of the posterior mitral valve leaflet(s). Moderate mitral annular calcification. Mild mitral valve regurgitation. Tricuspid Valve: The tricuspid valve is abnormal. Tricuspid valve regurgitation is mild to moderate. Aortic Valve: The aortic valve is tricuspid. Aortic valve regurgitation is not visualized. Aortic valve sclerosis/calcification is present, without any evidence of aortic stenosis. Pulmonic Valve: The pulmonic valve was grossly normal. Pulmonic valve regurgitation is trivial.  Aorta: The aortic root and ascending aorta are structurally normal, with no evidence of dilitation. Venous: The inferior vena cava is dilated in size with less than 50% respiratory variability, suggesting right atrial pressure of 15 mmHg. IAS/Shunts: No atrial level shunt detected by color flow Doppler. Additional Comments: A device lead is visualized.  LEFT VENTRICLE PLAX 2D                        Biplane EF (MOD) LVIDd:         6.20 cm         LV Biplane EF:   Left LVIDs:         5.50 cm                          ventricular LV PW:         1.00 cm                          ejection LV IVS:        0.90 cm                          fraction by LVOT diam:     2.10 cm                          2D MOD LV SV:         76  biplane is LV SV Index:   41                               25.1 %. LVOT Area:     3.46 cm                                Diastology                                LV e' medial:    3.08 cm/s LV Volumes (MOD)               LV E/e' medial:  28.6 LV vol d, MOD    287.0 ml      LV e' lateral:   5.51 cm/s A2C:                           LV E/e' lateral: 16.0 LV vol d, MOD    269.0 ml A4C: LV vol s, MOD    215.0 ml A2C: LV vol s, MOD    197.0 ml A4C: LV SV MOD A2C:   72.0 ml LV SV MOD A4C:   269.0 ml LV SV MOD BP:    69.3 ml RIGHT VENTRICLE RV Basal diam:  3.90 cm RV Mid diam:    4.00 cm RV S prime:     14.50 cm/s LEFT ATRIUM              Index        RIGHT ATRIUM           Index LA diam:        5.00 cm  2.68 cm/m   RA Area:     26.00 cm LA Vol (A2C):   134.0 ml 71.87 ml/m  RA Volume:   91.80 ml  49.24 ml/m LA Vol (A4C):   122.0 ml 65.43 ml/m LA Biplane Vol: 135.0 ml 72.41 ml/m  AORTIC VALVE LVOT Vmax:   111.00 cm/s LVOT Vmean:  84.800 cm/s LVOT VTI:    0.220 m  AORTA Ao Asc diam: 3.00 cm MITRAL VALVE                TRICUSPID VALVE MV Area (PHT): 4.99 cm     TR Peak grad:   49.6 mmHg MV Decel Time: 152 msec     TR Vmax:        352.00 cm/s MV E velocity: 88.00 cm/s MV A velocity:  115.00 cm/s  SHUNTS MV E/A ratio:  0.77         Systemic VTI:  0.22 m                             Systemic Diam: 2.10 cm Vinie Maxcy MD Electronically signed by Vinie Maxcy MD Signature Date/Time: 05/13/2024/3:28:01 PM    Final    DG Chest Port 1 View Result Date: 05/12/2024 EXAM: 1 VIEW XRAY OF THE CHEST 05/12/2024 09:45:00 PM COMPARISON: 02/16/2024 CLINICAL HISTORY: Shortness of breath. Pt recently d/c from hospital for diverticulitis Wednesday. Diarrhea resolved, now c/o emesis today x 1 hour. Reports chills and diaphoresis. C/o joint pain. FINDINGS: LINES, TUBES AND DEVICES: Defibrillator pads on left chest. Left subclavian approach biventricular pacer with leads  in right atrium, right ventricle, and coronary sinus. LUNGS AND PLEURA: Increasing interstitial markings, favoring possible mild perihilar edema, although chronic. No focal pulmonary opacity. No pleural effusion. No pneumothorax. HEART AND MEDIASTINUM: Cardiomegaly. BONES AND SOFT TISSUES: Status post right shoulder arthroplasty. No acute osseous abnormality. IMPRESSION: 1. Possible mild perihilar edema, chronic. 2. Cardiomegaly. Electronically signed by: Pinkie Pebbles MD 05/12/2024 09:56 PM EDT RP Workstation: [id number]    Microbiology: Results for orders placed or performed during the hospital encounter of 05/28/24  Resp panel by RT-PCR (RSV, Flu A&B, Covid) Anterior Nasal Swab     Status: None   Collection Time: 05/28/24 12:28 PM   Specimen: Anterior Nasal Swab  Result Value Ref Range Status   SARS Coronavirus 2 by RT PCR NEGATIVE NEGATIVE Final    Comment: (NOTE) SARS-CoV-2 target nucleic acids are NOT DETECTED.  The SARS-CoV-2 RNA is generally detectable in upper respiratory specimens during the acute phase of infection. The lowest concentration of SARS-CoV-2 viral copies this assay can detect is 138 copies/mL. A negative result does not preclude SARS-Cov-2 infection and should not be used as the sole basis for treatment  or other patient management decisions. A negative result may occur with  improper specimen collection/handling, submission of specimen other than nasopharyngeal swab, presence of viral mutation(s) within the areas targeted by this assay, and inadequate number of viral copies(<138 copies/mL). A negative result must be combined with clinical observations, patient history, and epidemiological information. The expected result is Negative.  Fact Sheet for Patients:  BloggerCourse.com  Fact Sheet for Healthcare Providers:  SeriousBroker.it  This test is no t yet approved or cleared by the [place]  FDA and  has been authorized for detection and/or diagnosis of SARS-CoV-2 by FDA under an Emergency Use Authorization (EUA). This EUA will remain  in effect (meaning this test can be used) for the duration of the COVID-19 declaration under Section 564(b)(1) of the Act, 21 U.S.C.section 360bbb-3(b)(1), unless the authorization is terminated  or revoked sooner.       Influenza A by PCR NEGATIVE NEGATIVE Final   Influenza B by PCR NEGATIVE NEGATIVE Final    Comment: (NOTE) The Xpert Xpress SARS-CoV-2/FLU/RSV plus assay is intended as an aid in the diagnosis of influenza from Nasopharyngeal swab specimens and should not be used as a sole basis for treatment. Nasal washings and aspirates are unacceptable for Xpert Xpress SARS-CoV-2/FLU/RSV testing.  Fact Sheet for Patients: BloggerCourse.com  Fact Sheet for Healthcare Providers: SeriousBroker.it  This test is not yet approved or cleared by the United States  FDA and has been authorized for detection and/or diagnosis of SARS-CoV-2 by FDA under an Emergency Use Authorization (EUA). This EUA will remain in effect (meaning this test can be used) for the duration of the COVID-19 declaration under Section 564(b)(1) of the Act, 21 U.S.C. section  360bbb-3(b)(1), unless the authorization is terminated or revoked.     Resp Syncytial Virus by PCR NEGATIVE NEGATIVE Final    Comment: (NOTE) Fact Sheet for Patients: BloggerCourse.com  Fact Sheet for Healthcare Providers: SeriousBroker.it  This test is not yet approved or cleared by the United States  FDA and has been authorized for detection and/or diagnosis of SARS-CoV-2 by FDA under an Emergency Use Authorization (EUA). This EUA will remain in effect (meaning this test can be used) for the duration of the COVID-19 declaration under Section 564(b)(1) of the Act, 21 U.S.C. section 360bbb-3(b)(1), unless the authorization is terminated or revoked.  Performed at [facility], [address] [place], [place] [address]    *Note: Due to a large number of results and/or encounters for the requested time period, some results have not been displayed. A complete set of results can be found in Results Review.    Labs: CBC: Recent Labs  Lab 06/01/24 0645 06/05/24 1023 06/06/24 0450  WBC 5.8 6.6 5.8  NEUTROABS  --  4.6  --   HGB 13.0 13.0 12.8  HCT 40.2 40.1 40.5  MCV 98.8 98.8 100.5*  PLT 280 178 194   Basic Metabolic Panel: Recent Labs  Lab 06/01/24 0645 06/05/24 1023 06/06/24 0450 06/07/24 0744  NA 140 137 140 137  K 3.9 3.5 5.3* 4.8  CL 101 102 105 99  CO2 26 17* 22 27  GLUCOSE 98 112* 91 131*  BUN 14 24* 19 15  CREATININE 0.84 1.01* 0.90 0.96  CALCIUM  9.9 9.8 9.5 9.6  MG 1.7 1.7 2.4 2.1   Liver Function Tests: Recent Labs  Lab 06/05/24 1023  AST 41  ALT 28  ALKPHOS 99  BILITOT 0.6  PROT 6.7  ALBUMIN 3.3*   CBG: Recent Labs  Lab 06/06/24 0819 06/06/24 1141 06/06/24 1517 06/06/24 2103 06/07/24 0613  GLUCAP 93 103* 101* 149* 108*    Discharge time spent: greater than 30 minutes.  Signed: Elidia Toribio Furnace, MD [facility] 06/07/2024

## 2024-06-07 NOTE — Progress Notes (Signed)
 Mobility Specialist Progress Note:   06/07/24 1130  Mobility  Activity Ambulated independently  Level of Assistance Modified independent, requires aide device or extra time  Assistive Device Front wheel [place]  Distance Ambulated (ft) 200 ft  Activity Response Tolerated well  Mobility Referral Yes  Mobility visit 1 Mobility  Mobility Specialist Start Time (ACUTE ONLY) 1130  Mobility Specialist Stop Time (ACUTE ONLY) 1140  Mobility Specialist Time Calculation (min) (ACUTE ONLY) 10 min   Pt ambulated in hallway at a modI level with RW. No physical assistance throughout. HR 80s throughout. Pt back in chair with all needs met.   Therisa Rana Mobility Specialist Please contact via SecureChat or  Rehab office at [phone]

## 2024-06-07 NOTE — Assessment & Plan Note (Signed)
 Patient was placed on insulin  sliding scale for glucose cover and monitoring.  Glucose remained stable.   Plan to continue statin therapy.

## 2024-06-07 NOTE — Assessment & Plan Note (Signed)
Continue blood pressure control with metoprolol. Follow up as outpatient.

## 2024-06-07 NOTE — Discharge Instructions (Signed)
 [employer] assistance programs Crisis assistance programs  -[facility]eless[facility] Arts development officer. If you are experiencing homelessness in [place], [place] , your first point of contact should be Pensions consultant. You can reach Coordinated Entry by calling ([phone] (667)510-1845 or by emailing coordinatedentry@[email or web address] [email or web address].  Community access points: [employer] [phone], [place]) every Tuesday from 9am-10am. [facility] ([address] [place]) every Wednesday from 8am-9am.   -[place] Coordinated Re-entry Daniel [place]cal[place]: Dial  211 and request. Offers referrals to homeless shelters in the area.    -The [employer] 778-418-5219) offers several services to local families, as funding allows. The Emergency Assistance Program (EAP), which they administer, provides household goods, free food, clothing, and financial aid to people in need in the [place]  area. The EAP program does have some qualification, and counselors will interview clients for financial assistance by written referral only. Referrals need to be made by the Department of Social Services or by other EAP approved human services agencies or charities in the area.  -Open Door Ministries of [employer], which can be reached at [phone], offers emergency assistance programs for those in need of help, such as food, rent assistance, a soup kitchen, shelter, and clothing. They are based in [facility]  but provide a number of services to those that qualify for assistance.   [facility] Department of Social Services may be able to offer temporary financial assistance and cash grants for paying rent and utilities, Help may be provided for local county residents who may be experiencing personal crisis when other resources, including government programs, are not available. Call [phone]  -High [employer] is a [employer] agency, The organization can offer emergency assistance for paying rent, Care[employer] Rx, utilities, food, household products and furniture. They offer extensive emergency and transitional housing for families, children and single women, and also run a Boy's and [employer]. Thrift Shops, Secondary school teacher, and other aid offered too. [address], [place], [place]  [address], [phone]  -[place] Low Income Energy Assistance Program -- This is offered for [facility] families. The federal government created [employer] Program provides a one-time cash grant payment to help eligible low-income families pay their electric and heating bills. [address] [place], [place]  [address], ([phone]  -[place] Emergency Assistance -- A program offers emergency utility and rent funds for greater [employer] area residents. The program can also provide counseling and referrals to charities and government programs. Also provides food and a free meal program that serves lunch Mondays - Saturdays and dinner seven days per week to individuals in the community. [address] [place], [place]  [address], [phone]  -[employer] - Offers affordable apartment and housing communities across      [place] and [place]. The low income and seniors can access public housing, rental assistance to qualified applicants, and apply for the section 8 rent subsidy program. Other programs include Chiropractor and Engineer, maintenance. [address] [place], [place]  [address], dial  [phone].  -The [facility] provides transitional housing to veterans and the disabled. Clients will also access other services too, including assistance in applying for Disability, life skills classes, case management, and assistance in finding permanent housing. [address], [place], [place]  [place] [address], call [phone]  -[place] Transitional Housing through [facility] is for people who were just  evicted or that are formerly homeless. The non-profit will also help then gain self-sufficiency, find a home or apartment to live in, and also provides information on rent assistance when needed. Phone [phone]  -The [place] [facility] helps low income, elderly, or disabled residents in seven counties in the [place] ([place], [place], [place], [place], [place], [place], [employer], and [place]) save energy and reduce their utility bills by improving energy efficiency. Phone [phone].  -[employer] is located in the [place] [facility] in the [employer], [address], [place], [place] [address]. Parking is in the rear of the building. Phone: [phone]   General Email: info@gsohc .org  GHC provides free housing counseling assistance in locating affordable rental housing or housing with support services for families and individuals in crisis and the chronically homeless. We provide potential resources for other housing needs like utilities. Our trained counselors also work with clients on budgeting and financial literacy in effort to empower them to take control of their financial situations. [employer] collaborates with homeless service providers and other stakeholders as part of the [employer] COC (Continuum of Care). The (COC) is a regional/local planning body that coordinates housing and services funding for homeless families and individuals. The role of [facility] in the COC is through housing counseling to work with people we serve on diversion strategies for those that are at imminent risk of becoming homeless. We also work with the Coordinated Assessment/Entry Specialist who attempts to find temporary solutions and/or connects the people  to Housing First, Rapid Re-housing or transitional housing programs. Our Homelessness Prevention Housing Counselors meet with clients on business days (Monday-Fridays, except scheduled holidays) from 8:30 am to 4:30 pm.  Legal assistance for evictions, foreclosure, and more -If you need free legal advice on civil issues, such as foreclosures, evictions, Electronics engineer, government programs, domestic issues and more, Landscape architect of [place]  [facility]) is a Associate Professor firm that provides free legal services and counsel to lower income people, seniors, disabled, and others, The goal is to ensure everyone has access to justice and fair representation. Call them at [phone].  [facility] for Housing and Community Studies can provide info about obtaining legal assistance with evictions. Phone [phone].  Data processing manager  The [employer], [employer]. offers job and Dispensing optician. Resources are focused on helping students obtain the skills and experiences that are necessary to compete in today's challenging and tight job market. The non-profit faith-based community action agency offers internship trainings as well as classroom instruction. Classes are tailored to meet the needs of people in the [facility] region. [place], [place] [address], [phone]  Foreclosure prevention/Debt Services Family Services of the [employer] Credit Counseling Service inludes debt and foreclosure prevention programs for local families. This includes money management, financial advice, budget review and development of a written action plan with a Pensions consultant to help solve specific individual financial problems. In addition, housing and mortgage counselors can also provide pre- and post-purchase homeownership counseling, default resolution counseling (to prevent foreclosure) and reverse mortgage counseling. A Debt Management Program allows  people and families with a high level of credit card or medical debt to consolidate and repay consumer debt and loans to creditors and rebuild positive credit ratings and scores. Contact (336) F1555895.  Community clinics in [place] -Health Department [facility]: [address], [place], [address]. [phone].  -Health Department [place] [facility]: [phone]  [address], [facility], [address]. [phone].  -[facility] offers medical care through a group of doctors, pharmacies and other healthcare related agencies that offer services for low income, uninsured adults in [place]. Also offers adult Dental care and assistance with applying for an [employer]. Call [phone].   [facility]. This center provides low-cost health care to those without health insurance. Services offered include an onsite pharmacy. Phone [phone]. [address], [address], [place].  -Medication Assistance Program serves as a link between pharmaceutical companies and patients to provide low cost or free prescription medications. This service is available for residents who meet certain income restrictions and have no insurance coverage. PLEASE CALL [phone] [place]) OR [phone] ([place])  -One Step Further: Materials engineer, The [employer] Support & Nutrition Program, [employer]. Call [phone]/ [phone].  Food pantry and assistance -[facility]ban Ministry-Food Bank: [address], [place] [address]. Phone [phone]  -[facility] Food Pantry: [address] [place], [place] [address]. [phone].  -Missionary Ministry: has the purpose of visiting the sick and shut-ins and provide for needs in the surrounding communities. Call [phone]. Email: [email or web address] .[email or web address] This program provides: Food box for seniors, Financial assistance, Food to meet basic  nutritional needs.  -Meals on Wheels with Senior Resources: [facility] residents age 59 and over who are homebound and unable to obtain and prepare a nutritious meal for themselves are eligible for this service. There may be a waiting list in certain parts of [facility] if the route in that area is full. If you are in [facility] and [place] call [phone] to register. For all other areas call [phone] to register.  -Greater Dietitian: https://findfood.BargainContractor.[email or web address]  TRANSPORTATION: -[employer] Department of Health: Call [facility] and [employer] at [phone] for details. AttractionGuides.es  -Access [place]: Access [place] is the [employer] Agency's shared-ride transportation service for eligible riders who have a disability that prevents them from riding the fixed route bus. Call ([phone]. Access [place] riders must pay a fare of $1.50 per trip, or may purchase a 10-ride punch card for $14.00 ($1.40 per ride) or a 40-ride punch card for $48.00 ($1.20 per ride).  -The Shepherd's WHEELS rideshare transportation service is provided for senior citizens (60+) who live independently within [place] city limits and are unable to drive or have limited access to transportation. Call [phone] to schedule an appointment.  -[place] Transportation: For Medicare or Medicaid recipients call [phone]?SABRA Ambulance, wheelchair fleeta, and ambulatory quotes available.   FLEEING VIOLENCE: -Family Services of the [place]- 24/7 Crisis line [phone]) -[facility]: (336) 641-SAFE 9561868236)  [place] 2-1-1 is another useful way to locate resources in the community. Visit ShedSizes.ch to find service information online. If you need additional assistance, 2-1-1 Referral Specialists are available 24 hours a day, every day by dialing  2-1-1 or [phone] from any phone. The call is free, confidential, and available in any language.  Affordable Housing Search http://www.nchousingsearch.[facility])   M-F 8a-3p [address] [place]  [place], [place] [address] [phone] Services include: laundry, barbering, support groups, case management, phone & computer access, showers, AA/NA mtgs, mental health/substance abuse nurse, job skills class, disability information, VA assistance, spiritual classes, etc. Winter Shelter available when temperatures are less than 32 degrees.   HOMELESS SHELTERS [facility] at [facility]- Call [phone] ext. [phone]  or ext. [phone]. Located at [address] [place], [place] [address]  [facility] Mens Shelter- Call [phone]. Located at [address], [place] [address].  [facility]- [employer]. Call [phone]. Office located at [address], [place] [address].  [facility] Family Housing through [place] [phone].  [facility] Family Shelter- Call [phone]. Located at [address] [place], [place], [place] [address].  Room at the Inn-For Pregnant mothers. Call [phone]. Located at [address] [place], [address].  [facility]-For men in [place]. Call [phone]. [facility]-Shelter in [place]. Call [phone].  Home of [employer] for [employer] ([phone]. Office located at [address] [place], [address].  [employer] be agreeable to help with chores. Call [phone] ext. [phone].  Men's: [address] [place], [place] [address]. Women's: [facility]  [address] [place], [place] [address]  Crisis Services [facility]rapeutic [facility]atives Mobile Crisis Management- [phone]  [facility] [address] [place], [place] [address]. Phone: [phone]  TRANSPORTATION: -[place] Department of Health: Call [facility] and [employer] at [phone] for details. AttractionGuides.es  -Access [place]: Access [place] is the [employer]'s shared-ride transportation service for eligible riders who have a disability that prevents them from riding the fixed route bus. Call ([phone]. Access [place] riders must pay a fare of $1.50 per trip, or may purchase a 10-ride punch card for $14.00 ($1.40 per ride) or a 40-ride punch card for $48.00 ($1.20 per ride).  -The Shepherd's WHEELS rideshare transportation service is provided for senior citizens (60+) who live independently within [place] city limits and are unable to drive or have limited access to transportation. Call [phone] to schedule an appointment.  -[place] Transportation: For Medicare or Medicaid recipients call [phone]?SABRA Ambulance, wheelchair fleeta, and ambulatory quotes available.   MEDICAID TRANSPORTATION: -If you have a Medicaid blue card or pink card and have no other means for transportation to doctor's offices, clinics, dentists, hospitals, and other health related trip needs.  -Transportation services are available to all [place] locations. Trips to [facility] and [place] are provided in association with PART. -Services are provided between 6:00AM and 9:00PM Monday-Friday. -Call [phone] to schedule a trip or request further information.   Financial Counseling with [facility]:  [phone]

## 2024-06-08 ENCOUNTER — Telehealth: Payer: Self-pay

## 2024-06-08 ENCOUNTER — Telehealth: Payer: Self-pay | Admitting: Cardiology

## 2024-06-08 ENCOUNTER — Ambulatory Visit: Admitting: Physician Assistant

## 2024-06-08 DIAGNOSIS — I5022 Chronic systolic (congestive) heart failure: Secondary | ICD-10-CM

## 2024-06-08 MED ORDER — SPIRONOLACTONE 25 MG PO TABS: 12.5000 mg | ORAL_TABLET | Freq: Every day | ORAL | Status: DC

## 2024-06-08 NOTE — Transitions of Care (Post Inpatient/ED Visit) (Signed)
   06/08/2024  Name: Tanya Hogan MRN: [id number] DOB: 1951-03-16  Today's TOC FU Call Status: Today's TOC FU Call Status:: Unsuccessful Call (1st Attempt) Unsuccessful Call (1st Attempt) Date: 06/08/24  Attempted to reach the patient regarding the most recent Inpatient/ED visit.  Follow Up Plan: Additional outreach attempts will be made to reach the patient to complete the Transitions of Care (Post Inpatient/ED visit) call.   Shona Prow RN, CCM [place]  VBCI-Population Health RN Care Manager [phone]

## 2024-06-08 NOTE — Telephone Encounter (Signed)
 Pt c/o medication issue:  1. Name of Medication:   spironolactone  (ALDACTONE ) 25 MG tablet   2. How are you currently taking this medication (dosage and times per day)?     3. Are you having a reaction (difficulty breathing--STAT)?   4. What is your medication issue?   Patient wants a call back to discuss if she should still be taking this medication.  Patient is concerned she may have gained weight since leaving the hospital.

## 2024-06-08 NOTE — Telephone Encounter (Signed)
 Left message for patient with Dr Lindi recommendations.   Lab orders mailed to the pt  She is to call if she needs prescription

## 2024-06-08 NOTE — Transitions of Care (Post Inpatient/ED Visit) (Signed)
 06/08/2024  Name: Tanya Hogan MRN: [id number] DOB: [birth year]  Today's TOC FU Call Status: Today's TOC FU Call Status:: Successful TOC FU Call Completed TOC FU Call Complete Date: 06/08/24 Patient's Name and Date of Birth confirmed.  Transition Care Management Follow-up Telephone Call Date of Discharge: 06/07/24 Discharge Facility: [name]) Type of Discharge: Inpatient Admission Primary Inpatient Discharge Diagnosis:: tachycardia How have you been since you were released from the hospital?: Better Any questions or concerns?: No  Items Reviewed: Did you receive and understand the discharge instructions provided?: Yes Medications obtained,verified, and reconciled?: Yes (Medications Reviewed) Any new allergies since your discharge?: No Dietary orders reviewed?: Yes Do you have support at home?: Yes People in Home [RPT]: sibling(s)  Medications Reviewed Today: Medications Reviewed Today     Reviewed by Emmitt Pan, LPN (Licensed Practical Nurse) on 06/08/24 at 1116  Med List Status: <None>   Medication Order Taking? Sig Documenting Provider Last Dose Status Informant  acetaminophen  (TYLENOL ) 325 MG tablet [id number] Yes Take 325 mg by mouth every 6 (six) hours as needed for mild pain (pain score 1-3). [provider]  Active Self, Pharmacy Records  albuterol  (PROVENTIL ) (2.5 MG/3ML) 0.083% nebulizer solution [id number] Yes Take 2.5 mg by nebulization every 6 (six) hours as needed for wheezing or shortness of breath. [provider]  Active Self, Pharmacy Records  albuterol  (VENTOLIN  HFA) 108 (90 Base) MCG/ACT inhaler [id number] Yes Inhale 2 puffs into the lungs every 6 (six) hours as needed for wheezing or shortness of breath. [provider]  Active Pharmacy Records, Self  alendronate  (FOSAMAX ) 70 MG tablet [id number] Yes TAKE 1 TABLET BY MOUTH EVERY 7  DAYS WITH FULL GLASS OF WATER  ON AN EMPTY STOMACH  Patient taking differently: Take 70 mg by  mouth every Saturday.   [name], MD  Active Self, Pharmacy Records           Med Note (COFFELL, JON HERO   Mon Jun 05, 2024  2:24 PM)    amiodarone  (PACERONE ) 200 MG tablet [id number] Yes Take 1 tablet (200 mg total) by mouth 2 (two) times daily. Lesia Ozell Prentice DEVONNA  Active Pharmacy Records, Self  aspirin  81 MG chewable tablet [id number] Yes Chew 81 mg by mouth daily. [provider]  Active Self, Pharmacy Records  Calcium  Carbonate-Vit D-Min (CALCIUM  600+D PLUS MINERALS) 600-400 MG-UNIT TABS [id number] Yes 1 tab p.o. twice daily Curtis Debby PARAS, MD  Active Self, Pharmacy Records  Continuous Glucose Receiver (FREESTYLE LIBRE 3 READER) DEVI [id number] Yes Use for continuous glucose monitoring [name], MD  Active Self, Pharmacy Records  Continuous Glucose Sensor (FREESTYLE LIBRE 3 [place]) [place] [id number] Yes by Does not apply route. Change every 14 days [provider]  Active Self, Pharmacy Records           Med Note JACKOLYN, [place] R   Thu Dec 09, 2023 10:26 AM)    EPINEPHrine  0.3 mg/0.3 mL IJ SOAJ injection [id number] Yes Inject 0.3 mg into the muscle as needed for anaphylaxis. [name], MD  Active Self, Pharmacy Records           Med Note (COFFELL, JON HERO   Mon Jun 05, 2024  2:27 PM) Has available, never used.  estradiol (ESTRACE) 0.1 MG/GM vaginal cream [id number] Yes Place 1 Applicatorful vaginally 3 (three) times a week.  Patient taking differently: Place 1 Applicatorful vaginally every Saturday.   [provider]  Active Self, Pharmacy Records  fexofenadine  (ALLEGRA )  180 MG tablet [id number] Yes Take 180 mg by mouth daily. [provider]  Active Self, Pharmacy Records  FIBER PO [id number] Yes Take 1-2 capsules by mouth See admin instructions. Take 1 capsule by mouth daily. May increase to 1 capsule before and after each meal - up to 6 capsules daily if needed for constipation. [provider]   Active Pharmacy Records, Self  fluticasone -salmeterol (WIXELA INHUB ) 250-50 MCG/ACT AEPB [id number] Yes Inhale 1 puff into the lungs in the morning and at bedtime. [provider]  Active Self, Pharmacy Records  GEMTESA 75 MG TABS [id number] Yes Take 75 mg by mouth daily. [provider]  Active Self, Pharmacy Records  glucose blood test strip [id number] Yes To be used to check blood glucose Dx:E11.8 FreeStyle Precision blood glucose for Freestyle Libre 2 [name], MD  Active Self, Pharmacy Records  isosorbide  mononitrate (IMDUR ) 30 MG 24 hr tablet [id number] Yes TAKE 1 TABLET BY MOUTH DAILY Pietro Redell RAMAN, MD  Active Self, Pharmacy Records  levothyroxine  (SYNTHROID ) 112 MCG tablet [id number] Yes TAKE 1 TABLET BY MOUTH DAILY [name], MD  Active Self, Pharmacy Records  lubiprostone [facility]) 24 MCG capsule [id number] Yes Take 24 mcg by mouth 2 (two) times daily with a meal. Patient reports provider has decreased to every three days.  Patient taking differently: Take 24 mcg by mouth daily.   [provider]  Active Self, Pharmacy Records           Med Note (COFFELL, JON CHRISTELLA Kitchens Jun 05, 2024  2:35 PM) All Rx's directed to take 24mcg BID - patient states she is only taking 24mcg QD.  metFORMIN  (GLUCOPHAGE -XR) 500 MG 24 hr tablet [id number] Yes TAKE 1 TABLET BY MOUTH TWICE  DAILY WITH MEALS [name], MD  Active Self, Pharmacy Records           Med Note STEFFI, [place]   Sat May 13, 2024 10:38 AM) Patient is taking the extended release twice a day.   metoprolol succinate (TOPROL-XL) 50 MG 24 hr tablet [id number] Yes Take 1 tablet (50 mg total) by mouth 2 (two) times daily. Take with or immediately following a meal. Leverne Charlies Helling, PA-C  Active Self, Pharmacy Records  Multiple Vitamins-Minerals (MULTIVITAMIN WOMEN 50+) TABS [id number] Yes Take 1 tablet by mouth daily. [provider]  Active Pharmacy Records, Self  pantoprazole   (PROTONIX ) 40 MG tablet [id number] Yes TAKE 1 TABLET BY MOUTH DAILY  BEFORE BREAKFAST  Patient taking differently: Take 40 mg by mouth 2 (two) times daily.   [name], MD  Active Self, Pharmacy Records           Med Note [place], [place] R   Thu Dec 09, 2023 10:26 AM)    potassium chloride  (KLOR-CON  M) 10 MEQ tablet [id number] Yes Take 1 tablet (10 mEq total) by mouth daily. Arrien, Mauricio Daniel, MD  Active   pravastatin  (PRAVACHOL ) 40 MG tablet [id number] Yes Take 40 mg by mouth daily. [provider]  Active Self, Pharmacy Records  Probiotic Product (PROBIOTIC PO) [id number] Yes Take 1 capsule by mouth daily. [provider]  Active Pharmacy Records, Self  quiNIDine sulfate 200 MG tablet [id number] Yes Take 1 tablet (200 mg total) by mouth 3 (three) times daily. Aniceto Daphne CROME, NP  Active   spironolactone  (ALDACTONE ) 25 MG tablet [id number] Yes Take 0.5 tablets (12.5 mg total) by mouth daily. Pietro Redell RAMAN, MD  Active   Tiotropium Bromide   Monohydrate (SPIRIVA  RESPIMAT) 1.25 MCG/ACT AERS [id number] Yes Inhale 1 puff into the lungs daily. [provider]  Active Self, Pharmacy Records  torsemide  (DEMADEX ) 20 MG tablet [id number] Yes TAKE 1 TABLET BY MOUTH DAILY Pietro Redell RAMAN, MD  Active Self, Pharmacy Records  triamcinolone  ointment (KENALOG ) 0.5 % [id number] Yes Apply 1 Application topically 2 (two) times daily as needed (skin irritation). [provider]  Active Pharmacy Records, Self  valACYclovir  (VALTREX ) 1000 MG tablet [id number] Yes Take 1 tablet (1,000 mg total) by mouth 2 (two) times daily.  Patient taking differently: Take 1,000 mg by mouth daily as needed (fever blisters).   Alvia Bring, DO  Active Self, Pharmacy Records            Home Care and Equipment/Supplies: Were Home Health Services Ordered?: Yes Name of Home Health Agency:: [facility] Has Agency set up a time to come to your home?: No Any new equipment or medical  supplies ordered?: NA  Functional Questionnaire: Do you need assistance with bathing/showering or dressing?: No Do you need assistance with meal preparation?: No Do you need assistance with eating?: No Do you have difficulty maintaining continence: No Do you need assistance with getting out of bed/getting out of a chair/moving?: No Do you have difficulty managing or taking your medications?: No  Follow up appointments reviewed: PCP Follow-up appointment confirmed?: Yes Date of PCP follow-up appointment?: 06/13/24 Follow-up Provider: [facility] Follow-up appointment confirmed?: Yes Date of Specialist follow-up appointment?: 06/12/24 Follow-Up Specialty Provider:: cardio Do you need transportation to your follow-up appointment?: No Do you understand care options if your condition(s) worsen?: Yes-patient verbalized understanding    SIGNATURE Julian Lemmings, LPN [facility] Nurse Health Advisor Direct Dial  [phone]

## 2024-06-08 NOTE — Telephone Encounter (Signed)
 S/w the patient. She feels a little puffy.  Weight was 183 yesterday on her scale and 185 today. So she has gained 2 pounds over night She reports that her ankle bone is puffy. She is a little short of breath, especially with activity. Not short of breath over the phone, though.   She is asking if she can restart Spironolactone  12.5 mg daily. Informed that I would send this to her provider and get back with her shortly. Given ER precautions.

## 2024-06-09 ENCOUNTER — Telehealth: Payer: Self-pay

## 2024-06-09 DIAGNOSIS — I472 Ventricular tachycardia, unspecified: Secondary | ICD-10-CM

## 2024-06-09 NOTE — Transitions of Care (Post Inpatient/ED Visit) (Signed)
 06/09/2024  Name: Tanya Hogan MRN: [id number] DOB: 14-Aug-1951  Today's TOC FU Call Status: TOC FU Call Complete Date: 06/09/24 Patient's Name and Date of Birth confirmed.  Transition Care Management Follow-up Telephone Call How have you been since you were released from the hospital?: Better Any questions or concerns?: No  Items Reviewed: Did you receive and understand the discharge instructions provided?: Yes Medications obtained,verified, and reconciled?: Yes (Medications Reviewed) Any new allergies since your discharge?: No Dietary orders reviewed?: Yes Type of Diet Ordered:: Heart healthy People in Home [RPT]: child(ren), adult Name of Support/Comfort Primary Source: Lives with brother - daughter, [name] is supportive  Medications Reviewed Today: Medications Reviewed Today   Medications were not reviewed in this encounter     Home Care and Equipment/Supplies: Were Home Health Services Ordered?: Yes Name of Home Health Agency:: [facility] Has Agency set up a time to come to your home?: Yes First Home Health Visit Date: 06/12/24 Any new equipment or medical supplies ordered?: No  Functional Questionnaire: Do you need assistance with bathing/showering or dressing?: No Do you need assistance with meal preparation?: No (patient states she's getting MOW) Do you need assistance with eating?: No Do you have difficulty maintaining continence: No Do you need assistance with getting out of bed/getting out of a chair/moving?: No Do you have difficulty managing or taking your medications?: No  Follow up appointments reviewed: PCP Follow-up appointment confirmed?: Yes Date of PCP follow-up appointment?: 06/13/24 Follow-up Provider: [facility] Follow-up appointment confirmed?: Yes Date of Specialist follow-up appointment?: 06/15/24 Follow-Up Specialty Provider:: cardio, Dr Lesia Do you need transportation to your follow-up appointment?: No (Uses Ride  Share program) Do you understand care options if your condition(s) worsen?: Yes-patient verbalized understanding  SDOH Interventions Today    Flowsheet Row Most Recent Value  SDOH Interventions   Food Insecurity Interventions Intervention Not Indicated  Housing Interventions Intervention Not Indicated  Transportation Interventions AMB Referral  Utilities Interventions Intervention Not Indicated    Goals Addressed             This Visit's Progress    VBCI Transitions of Care (TOC) Care Plan       Problems:  Recent Hospitalization for treatment of Ventricular Tachycardia Pt with multiple readmissions:  Admit/Discharge Date   10/5 - 10/10 & 10/13 - 10/15  [place]   Primary Diagnosis: Ventricular tachycardia Admit/Discharge Date   9/19 -10/1   [place]    Primary Diagnosis: VTach- Heart cath 9/25 (Dr. Wendel) Admit/Discharge Date   9/14 - 9/17 [place]  Primary Diagnosis: Acute diverticulitis - colitis  Goal:  Over the next 30 days, the patient will not experience hospital readmission  Interventions:  Update 06/09/24: Patient states she feels like her thought process is better now. Patient was more easily able to review medications than prior TOC calls. Patient was unsure about maintenance inhalers because she had others - reviewed inhalers from discharge and she moved the other inhalers away.  Call was lengthy and TOC RN was unable to complete all assessment questions. Patient reports she is currently able to get to MD appts but is worried about the future and need for more transportation - Ref placed to BSW, Orlean Fey for transportation needs    Heart Failure Interventions: Provided education on low sodium diet Provided education about placing scale on hard, flat surface Advised patient to weigh each morning after emptying bladder Discussed importance of daily weight and advised patient to weigh and record daily  Reviewed role of diuretics in prevention of  fluid overload and management of heart failure; Discussed the importance of keeping all appointments with provider Provided patient with education about the role of exercise in the management of heart failure Report weight gain of  more than two pounds in a day and 3-5 pounds in a week.   Evaluation of current treatment plan related to T-Tach with PVC's, self-management and patient's adherence to plan as established by provider. Discussed plans with patient for ongoing care management follow up and provided patient with direct contact information for care management team Advised patient to provide appropriate vaccination information to provider or care management team member at next visit Reviewed medications with patient and discussed New Medications that were ordered: Metoprolol and Mixelitine Advised patient to discuss Paused Amiodorone 200mg  twice daily with provider Assessed social determinant of health barriers The patient lives with her brother who has Parkinson's and now has two broken legs and is immobile and incontinent. The patient cannot help him. They have hired help for one hour three times a day for the patient to be changed and cleaned. They do have MOW The patient is unable to drive for six months. Recommended [employer] transportation service to assist with provider appointments.   Patient Self Care Activities:  Attend all scheduled provider appointments Call pharmacy for medication refills 3-7 days in advance of running out of medications Call provider office for new concerns or questions  Notify RN Care Manager of [facility] call rescheduling needs Participate in Transition of Care Program/Attend [facility] scheduled calls Perform all self care activities independently  Take medications as prescribed    Plan:  Telephone follow up appointment with care management team member scheduled for:  06/16/24 in the afternoon          Shona Prow RN, CCM [employer]  VBCI-Population Health RN  Care Manager [phone]

## 2024-06-10 DIAGNOSIS — G4733 Obstructive sleep apnea (adult) (pediatric): Secondary | ICD-10-CM | POA: Diagnosis not present

## 2024-06-12 ENCOUNTER — Encounter: Admitting: Student

## 2024-06-13 ENCOUNTER — Other Ambulatory Visit: Payer: Self-pay

## 2024-06-13 ENCOUNTER — Encounter: Payer: Self-pay | Admitting: Family Medicine

## 2024-06-13 ENCOUNTER — Ambulatory Visit (INDEPENDENT_AMBULATORY_CARE_PROVIDER_SITE_OTHER): Admitting: Family Medicine

## 2024-06-13 VITALS — BP 107/75 | HR 90 | Ht 60.0 in | Wt 185.0 lb

## 2024-06-13 DIAGNOSIS — E876 Hypokalemia: Secondary | ICD-10-CM | POA: Diagnosis not present

## 2024-06-13 DIAGNOSIS — Z7984 Long term (current) use of oral hypoglycemic drugs: Secondary | ICD-10-CM | POA: Diagnosis not present

## 2024-06-13 DIAGNOSIS — I5022 Chronic systolic (congestive) heart failure: Secondary | ICD-10-CM

## 2024-06-13 DIAGNOSIS — R197 Diarrhea, unspecified: Secondary | ICD-10-CM | POA: Diagnosis not present

## 2024-06-13 DIAGNOSIS — E785 Hyperlipidemia, unspecified: Secondary | ICD-10-CM | POA: Diagnosis not present

## 2024-06-13 DIAGNOSIS — I472 Ventricular tachycardia, unspecified: Secondary | ICD-10-CM

## 2024-06-13 DIAGNOSIS — E1169 Type 2 diabetes mellitus with other specified complication: Secondary | ICD-10-CM

## 2024-06-13 DIAGNOSIS — I10 Essential (primary) hypertension: Secondary | ICD-10-CM | POA: Diagnosis not present

## 2024-06-13 NOTE — Patient Instructions (Signed)
 [name] Finder - I am sorry I was unable to reach you today for our scheduled appointment. I work with [name], MD and am calling to support your healthcare needs. Please contact me at [phone] at your earliest convenience. I look forward to speaking with you soon.   Thank you,  Orlean Fey, BSW [place]  [facility] Social Worker, [employer] [phone]

## 2024-06-13 NOTE — Assessment & Plan Note (Signed)
 BMI of 35 with complications including heart failure and diabetes.  Right now her appetite is down and she has really not been eating a whole lot since she has been home.

## 2024-06-13 NOTE — Assessment & Plan Note (Signed)
 Amitiza until diarrhea stops.  Then okay to restart medication.

## 2024-06-13 NOTE — Patient Instructions (Addendum)
 OK to take the toreside, 2 tabs in AM for 2 days in a row to see if able to drop weight by 2-3 lbs.    Some of your meds can be moved to lunch time or right after lunch.  That includes your calcium  with vitamin D , multivitamin, Allegra ,  Hold the lubiprostone, Amitiza while you are having diarrhea.

## 2024-06-13 NOTE — Assessment & Plan Note (Signed)
 Blood pressure is well-controlled today.

## 2024-06-13 NOTE — Assessment & Plan Note (Addendum)
 Her last A1c in October looked fantastic she is doing well in regards to controlling blood sugar levels.  Continue to monitor carefully.  Continue with metformin .  Due to check urine microalbumin.

## 2024-06-13 NOTE — Assessment & Plan Note (Signed)
 She is currently on a good regimen that seems to be working well so far.  Occasionally will feel her heart flutter slightly but it feels very mild

## 2024-06-13 NOTE — Progress Notes (Signed)
 Established Patient Office Visit  Subjective  Patient ID: Tanya Hogan, female    DOB: Apr 16, 1951  Age: 73 y.o. MRN: [id number]  Chief Complaint  Patient presents with   Hospitalization Follow-up    HPI  She is here today for hospital follow-up she unfortunately has been in and out of the hospital over the last couple months this last time she was admitted to [facility] on October 13 and discharged home 2 days later on October 15 she called EMS after having severe chest pain.  She was found to be in ventricular tachycardia for about 10 to 15 seconds she was placed on lidocaine  drip after an 80 mg bolus and then transported to the emergency room initial vitals were actually very good and reassuring including her blood pressure.  Chest film showed bibasilar vascular congestion they did interrogate her defibrillator device which did demonstrate some episodes of V. tach the device was reprogrammed.  Her quinidine with dose was increased to 2 mg 3 times a day.  She was also started on potassium they recommended strongly try to keep the potassium level around 4 and her magnesium  level around 2.  They did consult palliative care while she was there.  Her current EF is 20 to 25% with global hypokinesis and moderate dilatation of the left ventricular cavity with grade 2 diastolic dysfunction.  Left atrium and right atrium both with severe dilatation.  She takes torsemide  20 mg daily more recently half a tab of her spironolactone  was added back as she had gone up about 3 pounds in weight since her discharge from the hospital.  She says at discharge she weighed about 182-183 and then at home around 185.  Again blood pressure looked good while she was hospitalized.  Since being home she has been doing okay she has not had any more episodes of severe chest pain.  She does get some occasional palpitations with activity but says she can usually just sit down and rest and it goes away.  There is last night she  started having multiple bouts of diarrhea she said her diverticulitis is acting up.  She did not have any Imodium to take at home which she sometimes uses.  She is currently on Amitiza for constipation.  Her brother whom she cares for was in rehab but is now back home but she is not able to do a lot or help take care of him her daughter has been helping out but she says she does not really fully trust her daughter.    ROS    Objective:     BP 107/75   Pulse 90   Ht 5' (1.524 m)   Wt 185 lb 0.6 oz (83.9 kg)   SpO2 96%   BMI 36.14 kg/m    Physical Exam Vitals and nursing note reviewed.  Constitutional:      Appearance: Normal appearance.  HENT:     Head: Normocephalic and atraumatic.  Eyes:     Conjunctiva/sclera: Conjunctivae normal.  Cardiovascular:     Rate and Rhythm: Normal rate and regular rhythm.  Pulmonary:     Effort: Pulmonary effort is normal.     Breath sounds: Normal breath sounds.  Skin:    General: Skin is warm and dry.  Neurological:     Mental Status: She is alert.  Psychiatric:        Mood and Affect: Mood normal.      Results for orders placed or performed in visit  on 06/13/24  Basic Metabolic Panel (BMET)  Result Value Ref Range   Glucose 99 70 - 99 mg/dL   BUN 14 8 - 27 mg/dL   Creatinine, Ser 8.98 (H) 0.57 - 1.00 mg/dL   eGFR 58 (L) >40 fO/fpw/8.26   BUN/Creatinine Ratio 14 12 - 28   Sodium 140 134 - 144 mmol/L   Potassium 4.2 3.5 - 5.2 mmol/L   Chloride 100 96 - 106 mmol/L   CO2 25 20 - 29 mmol/L   Calcium  10.0 8.7 - 10.3 mg/dL  Urine Microalbumin w/creat. ratio  Result Value Ref Range   Creatinine, Urine 15.4 Not Estab. mg/dL   Microalbumin, Urine 8.5 Not Estab. ug/mL   Microalb/Creat Ratio 55 (H) 0 - 29 mg/g creat      The ASCVD Risk score (Arnett DK, et al., 2019) failed to calculate for the following reasons:   Risk score cannot be calculated because patient has a medical history suggesting prior/existing ASCVD     Assessment & Plan:   Problem List Items Addressed This Visit       Cardiovascular and Mediastinum   Ventricular tachycardia (HCC)   She is currently on a good regimen that seems to be working well so far.  Occasionally will feel her heart flutter slightly but it feels very mild      Essential hypertension   Blood pressure is well-controlled today.      Chronic systolic heart failure (HCC)   Continue with metoprolol, furosemide , spironolactone .  She is allergic to ACEs and ARB's        Endocrine   Hyperlipidemia associated with type 2 diabetes mellitus (HCC) - Primary   Her last A1c in October looked fantastic she is doing well in regards to controlling blood sugar levels.  Continue to monitor carefully.  Continue with metformin .  Due to check urine microalbumin.        Other   Overflow diarrhea   Amitiza until diarrhea stops.  Then okay to restart medication.      Morbid obesity (HCC)   BMI of 35 with complications including heart failure and diabetes.  Right now her appetite is down and she has really not been eating a whole lot since she has been home.      Hypokalemia   Relevant Orders   Basic Metabolic Panel (BMET) (Completed)   Other Visit Diagnoses       Diarrhea, unspecified type          Diarrhea-did recommend holding her Amitiza until the diarrhea subsides.  Okay to use Imodium if needed.  She said she did not eat anything unusual.  Also feels like she has a heavy pill burden and says she takes 15 pills in the morning so we did discuss which medication she could maybe move to lunchtime so that she could separate some of the medications out. Some of your meds can be moved to lunch time or right after lunch.  That includes your calcium  with vitamin D , multivitamin, Allegra , she could even choose to hold a few of these medications until she is feeling better.  Hypokalemia - due to recheck potassium today she is on new start supplement.  Return in about 3  months (around 09/13/2024).    Dorothyann Byars, MD

## 2024-06-13 NOTE — Assessment & Plan Note (Addendum)
 Continue with metoprolol, furosemide , spironolactone .  She is allergic to ACEs and ARB's

## 2024-06-14 ENCOUNTER — Telehealth: Payer: Self-pay

## 2024-06-14 ENCOUNTER — Other Ambulatory Visit: Payer: Self-pay | Admitting: Family Medicine

## 2024-06-14 ENCOUNTER — Ambulatory Visit: Payer: Self-pay | Admitting: Family Medicine

## 2024-06-14 DIAGNOSIS — R809 Proteinuria, unspecified: Secondary | ICD-10-CM

## 2024-06-14 DIAGNOSIS — E1169 Type 2 diabetes mellitus with other specified complication: Secondary | ICD-10-CM

## 2024-06-14 LAB — BASIC METABOLIC PANEL WITH GFR
BUN/Creatinine Ratio: 14 (ref 12–28)
BUN: 14 mg/dL (ref 8–27)
CO2: 25 mmol/L (ref 20–29)
Calcium: 10 mg/dL (ref 8.7–10.3)
Chloride: 100 mmol/L (ref 96–106)
Creatinine, Ser: 1.01 mg/dL — ABNORMAL HIGH (ref 0.57–1.00)
Glucose: 99 mg/dL (ref 70–99)
Potassium: 4.2 mmol/L (ref 3.5–5.2)
Sodium: 140 mmol/L (ref 134–144)
eGFR: 58 mL/min/1.73 — ABNORMAL LOW (ref >59)

## 2024-06-14 LAB — MICROALBUMIN / CREATININE URINE RATIO
Creatinine, Urine: 15.4 mg/dL
Microalb/Creat Ratio: 55 mg/g{creat} — ABNORMAL HIGH (ref 0–29)
Microalbumin, Urine: 8.5 ug/mL

## 2024-06-14 NOTE — Progress Notes (Signed)
 Kidney function is stable at 1.0.  There is a little extra protein in the urine is not a large amount but I do want to keep an eye on this.  Last time we checked it 2 years ago it looked good.  So lets plan to recheck again in 3 to 4 months.

## 2024-06-14 NOTE — Telephone Encounter (Signed)
 Copied from CRM [id number]. Topic: Clinical - Home Health Verbal Orders >> Jun 14, 2024  1:11 PM Anairis L wrote: Caller/Agency: [facility]  Callback Number: [phone] Secure VM Name/tittle/Provider Name Service Requested: Skilled Nursing Frequency: 1 times 9 Any new concerns about the patient? Yes lubiprostone (AMITIZA) 24 MCG capsule Discharge 2x a day. Patient claims Dr.[name] changed it to every 3 days.

## 2024-06-14 NOTE — Progress Notes (Signed)
 SABRA

## 2024-06-14 NOTE — Telephone Encounter (Signed)
 Patient advised of recommendations.

## 2024-06-14 NOTE — Telephone Encounter (Signed)
 I wonder if she may be getting confused I did not tell her to change the dosing she had come in having diarrhea yesterday so I told her to hold it for a couple of days until her diarrhea subsides and then to restart it.  I did not say to change how she is taking it.  If she wants to restart with once a day just to make sure it is not triggering diarrhea again and then build up to twice a day again that is fine.

## 2024-06-15 ENCOUNTER — Other Ambulatory Visit (HOSPITAL_COMMUNITY): Payer: Self-pay

## 2024-06-15 ENCOUNTER — Ambulatory Visit: Attending: Student | Admitting: Student

## 2024-06-15 ENCOUNTER — Encounter: Payer: Self-pay | Admitting: Student

## 2024-06-15 VITALS — BP 109/69 | HR 89 | Ht 60.0 in | Wt 185.4 lb

## 2024-06-15 DIAGNOSIS — I428 Other cardiomyopathies: Secondary | ICD-10-CM | POA: Diagnosis not present

## 2024-06-15 DIAGNOSIS — I5022 Chronic systolic (congestive) heart failure: Secondary | ICD-10-CM

## 2024-06-15 DIAGNOSIS — I442 Atrioventricular block, complete: Secondary | ICD-10-CM | POA: Diagnosis not present

## 2024-06-15 DIAGNOSIS — I472 Ventricular tachycardia, unspecified: Secondary | ICD-10-CM | POA: Diagnosis not present

## 2024-06-15 LAB — CUP PACEART INCLINIC DEVICE CHECK
Date Time Interrogation Session: 20251023112600
Implantable Lead Connection Status: 753985
Implantable Lead Connection Status: 753985
Implantable Lead Connection Status: 753985
Implantable Lead Implant Date: 20030324
Implantable Lead Implant Date: 20230313
Implantable Lead Implant Date: 20251008
Implantable Lead Location: 753859
Implantable Lead Location: 753860
Implantable Lead Location: 753860
Implantable Lead Model: 3830
Implantable Lead Model: 4068
Implantable Pulse Generator Implant Date: 20251008

## 2024-06-15 NOTE — Progress Notes (Signed)
  Electrophysiology Office Note:   ID:  Tanya, Hogan April 30, 1951, MRN [id number]  Primary Cardiologist: Redell Shallow, MD Electrophysiologist: [name], MD      History of Present Illness:   Tanya Hogan is a 73 y.o. female with h/o PVC's/NSVT, VT s/p upgrade to CRT-D, HTN, HFrEF / NICM, OSA on CPAP, Pulmonary HTN, NASH, GERD, IBS, hypothyroidism, DM, depression,& fibromyalgia  seen today for post hospital follow up.    Admitted 9/19 - 10/1 with VT storm. Multiple discussions for possible treatments given prior abandoned lead. Loaded on IV amiodarone  and mexitil .Felt to be poor extraction candidate including in conversation with [facility].   Re-admitted 10/5 - 06/02/2024 in the setting of VT storm. She underwent upgrade of CRT-P to CRT-D system with the addition of an RV lead and capping the LV lead by Dr. Primus. Given recurrent VT, she was started on quinidine which was well-tolerated in addition to her baseline amiodarone .  Transitioned from quinidine sulfate to gluconate on discharge.   Returned to hospital 10/13-10/15 with recurrent VT storm and was switched back to quinidine sulfate with quiescence of her VT.   Since discharge from hospital the patient reports doing well. No further VT of which she is aware. Not very active. Mild DOE with exertion. Otherwise, no chest pain, edema, or syncope. Only received 20 day fill of quinidine. Will check downstairs for refills.  Review of systems complete and found to be negative unless listed in HPI.   EP Information / Studies Reviewed:    EKG is ordered today. Personal review as below.  EKG Interpretation Date/Time:  Thursday June 15 2024 10:47:02 EDT Ventricular Rate:  89 PR Interval:  190 QRS Duration:  168 QT Interval:  450 QTC Calculation: 547 R Axis:   -20  Text Interpretation: AV dual-paced rhythm When compared with ECG of 05-Jun-2024 10:50, PREVIOUS ECG IS PRESENT Confirmed by Lesia Sharper [phone]) on  06/15/2024 11:04:40 AM    ICD Interrogation-  reviewed in detail today,  See PACEART report.  Arrhythmia/Device History MDT CRT-P implanted 11/14/2001 >> Gen change 07/2014 Gen change and LB area lead placed 11/03/21 She has an abandoned RV (apical) lead LV lead abandoned and new HV lead implanted 05/31/2024    Physical Exam:   VS:  BP 109/69   Pulse 89   Ht 5' (1.524 m)   Wt 185 lb 6.4 oz (84.1 kg)   SpO2 96%   BMI 36.21 kg/m    Wt Readings from Last 3 Encounters:  06/15/24 185 lb 6.4 oz (84.1 kg)  06/13/24 185 lb 0.6 oz (83.9 kg)  06/07/24 187 lb 4.8 oz (85 kg)     GEN: No acute distress  NECK: No JVD; No carotid bruits CARDIAC: Regular rate and rhythm, no murmurs, rubs, gallops RESPIRATORY:  Clear to auscultation without rales, wheezing or rhonchi  ABDOMEN: Soft, non-tender, non-distended EXTREMITIES:  No edema; No deformity   ASSESSMENT AND PLAN:    Chronic systolic CHF  s/p [employer] CRT-D  Monomorphic VT euvolemic today Stable on an appropriate medical regimen Normal ICD function NSVT but no treated arrhythmia since recent admission. See Elisabeth Art report No changes today Continue amiodarone  200 mg BID Continue quinidine sulfate 200 mg TID Continue toprol 50 mg daily Labs stable.   HTN Stable on current regimen   Disposition:   Follow up with Dr. Primus in 3 months   Signed, Sharper Prentice Lesia, PA-C

## 2024-06-15 NOTE — Patient Instructions (Signed)
 Medication Instructions:   Your physician recommends that you continue on your current medications as directed. Please refer to the Current Medication list given to you today.   *If you need a refill on your cardiac medications before your next appointment, please call your pharmacy*   Lab Work:  NONE ORDERED  TODAY     If you have labs (blood work) drawn today and your tests are completely normal, you will receive your results only by: MyChart Message (if you have MyChart) OR A paper copy in the mail If you have any lab test that is abnormal or we need to change your treatment, we will call you to review the results.    Testing/Procedures: NONE ORDERED  TODAY    Follow-Up: At [facility], you and your health needs are our priority.  As part of our continuing mission to provide you with exceptional heart care, our providers are all part of one team.  This team includes your primary Cardiologist (physician) and Advanced Practice Providers or APPs (Physician Assistants and Nurse Practitioners) who all work together to provide you with the care you need, when you need it.   Your next appointment:   AS SCHEDULED      We recommend signing up for the patient portal called "MyChart".  Sign up information is provided on this After Visit Summary.  MyChart is used to connect with patients for Virtual Visits (Telemedicine).  Patients are able to view lab/test results, encounter notes, upcoming appointments, etc.  Non-urgent messages can be sent to your provider as well.   To learn more about what you can do with MyChart, go to [email or web address].   Other Instructions

## 2024-06-16 ENCOUNTER — Other Ambulatory Visit

## 2024-06-16 ENCOUNTER — Other Ambulatory Visit (HOSPITAL_COMMUNITY): Payer: Self-pay

## 2024-06-16 ENCOUNTER — Telehealth: Payer: Self-pay

## 2024-06-16 NOTE — Telephone Encounter (Signed)
 Added  urine microalbumin order to patient chart for repeat testing in 3 to 4 months.

## 2024-06-16 NOTE — Transitions of Care (Post Inpatient/ED Visit) (Signed)
 Transition of Care week 3  Visit Note  06/16/2024  Name: Tanya Hogan MRN: [id number]          DOB: 02/09/1951  Situation: Patient enrolled in [facility] 30-day program. Visit completed with patient by telephone.   Background: Admit/Discharge Date   10/5 - 10/10 & 10/13 - 10/15  [place]   Primary Diagnosis: Ventricular tachycardia Admit/Discharge Date   9/19 -10/1   [place]    Primary Diagnosis: VTach- Heart cath 9/25 (Dr. Wendel) Admit/Discharge Date   9/14 - 9/17 [place]  Primary Diagnosis: Acute diverticulitis - colitis  Initial Transition Care Management Follow-up Telephone Call Discharge Date and Diagnosis: 06/07/24, Ventricular tachycardia   Past Medical History:  Diagnosis Date   Alkaline phosphatase elevation    Asthma    Breast cancer (HCC) 2011   Carpal tunnel syndrome    bilateral   Chronic HFrEF (heart failure with reduced ejection fraction) (HCC)    Cirrhosis, non-alcoholic (HCC)    Closed fracture of unspecified part of radius (alone)    DDD (degenerative disc disease), lumbar    Depression    pt denies   Diabetes mellitus without complication (HCC)    Enlarged heart    Fibromyalgia    GERD (gastroesophageal reflux disease)    History of kidney stones    HTN (hypertension)    Hypothyroidism    IBS (irritable bowel syndrome)    LBBB (left bundle branch block)    Microcytic anemia 05/14/2017   Mitral regurgitation    NICM (nonischemic cardiomyopathy) (HCC)    Pneumonia    Presence of permanent cardiac pacemaker    Pulmonary hypertension (HCC)    PVD (peripheral vascular disease)    2019   Sleep apnea    uses cpap   Tricuspid regurgitation    Ventricular tachyarrhythmia (HCC)     Assessment: patient requested to end call prior to assessment completion   There were no vitals filed for this visit.  Medications Reviewed Today     Reviewed by Lauro Shona LABOR, RN (Registered Nurse) on 06/16/24 at 1430  Med List Status: <None>    Medication Order Taking? Sig Documenting Provider Last Dose Status Informant  acetaminophen  (TYLENOL ) 325 MG tablet [id number] Yes Take 325 mg by mouth every 6 (six) hours as needed for mild pain (pain score 1-3). [provider]  Active Self, Pharmacy Records  albuterol  (PROVENTIL ) (2.5 MG/3ML) 0.083% nebulizer solution [id number] Yes Take 2.5 mg by nebulization every 6 (six) hours as needed for wheezing or shortness of breath. [provider]  Active Self, Pharmacy Records  albuterol  (VENTOLIN  HFA) [phone] Base) MCG/ACT inhaler [id number]  Inhale 2 puffs into the lungs every 6 (six) hours as needed for wheezing or shortness of breath.  Patient not taking: Reported on 06/16/2024   [provider]  Active Pharmacy Records, Self  alendronate  (FOSAMAX ) 70 MG tablet [id number] Yes TAKE 1 TABLET BY MOUTH EVERY 7  DAYS WITH FULL GLASS OF WATER  ON AN EMPTY STOMACH [name], MD  Active Self, Pharmacy Records           Med Note (COFFELL, JON HERO   Mon Jun 05, 2024  2:24 PM)    amiodarone  (PACERONE ) 200 MG tablet [id number] Yes Take 1 tablet (200 mg total) by mouth 2 (two) times daily. Lesia Ozell Prentice DEVONNA  Active Pharmacy Records, Self  aspirin  81 MG chewable tablet [id number] Yes Chew 81 mg by mouth daily. [provider]  Active Self,  Pharmacy Records  Calcium  Carbonate-Vit D-Min (CALCIUM  600+D PLUS MINERALS) 600-400 MG-UNIT TABS [id number]  1 tab p.o. twice daily  Patient not taking: Reported on 06/16/2024   Curtis Debby PARAS, MD  Active Self, Pharmacy Records  Continuous Glucose Receiver (FREESTYLE LIBRE 3 READER) DEVI [id number] Yes Use for continuous glucose monitoring [name], MD  Active Self, Pharmacy Records  Continuous Glucose Sensor (FREESTYLE LIBRE 3 [place]) [place] [id number] Yes by Does not apply route. Change every 14 days [provider]  Active Self, Pharmacy Records           Med Note JACKOLYN, [place] R   Thu  Dec 09, 2023 10:26 AM)    EPINEPHrine  0.3 mg/0.3 mL IJ SOAJ injection [id number] Yes Inject 0.3 mg into the muscle as needed for anaphylaxis. [name], MD  Active Self, Pharmacy Records           Med Note (COFFELL, JON HERO   Mon Jun 05, 2024  2:27 PM) Has available, never used.  estradiol (ESTRACE) 0.1 MG/GM vaginal cream [id number] Yes Place 1 Applicatorful vaginally 3 (three) times a week. [provider]  Active Self, Pharmacy Records  fexofenadine  (ALLEGRA ) 180 MG tablet [id number]  Take 180 mg by mouth daily.  Patient not taking: Reported on 06/16/2024   [provider]  Active Self, Pharmacy Records  fluticasone -salmeterol (WIXELA INHUB ) 250-50 MCG/ACT AEPB [id number] Yes Inhale 1 puff into the lungs in the morning and at bedtime. [provider]  Active Self, Pharmacy Records  GEMTESA 75 MG TABS [id number]  Take 75 mg by mouth daily.  Patient not taking: Reported on 06/16/2024   [provider]  Active Self, Pharmacy Records  glucose blood test strip [id number] Yes To be used to check blood glucose Dx:E11.8 FreeStyle Precision blood glucose for Freestyle Libre 2 [name], MD  Active Self, Pharmacy Records  isosorbide  mononitrate (IMDUR ) 30 MG 24 hr tablet [id number] Yes TAKE 1 TABLET BY MOUTH DAILY Pietro Redell RAMAN, MD  Active Self, Pharmacy Records  levothyroxine  (SYNTHROID ) 112 MCG tablet [id number] Yes TAKE 1 TABLET BY MOUTH DAILY [name], MD  Active Self, Pharmacy Records  lubiprostone (AMITIZA) 24 MCG capsule [id number]  Take 24 mcg by mouth 2 (two) times daily with a meal. Patient reports provider has decreased to every three days.  Patient not taking: Reported on 06/16/2024   [provider]  Active Self, Pharmacy Records           Med Note (COFFELL, JON HERO [place] Jun 05, 2024  2:35 PM) All Rx's directed to take 24mcg BID - patient states she is only taking 24mcg QD.  metFORMIN  (GLUCOPHAGE -XR) 500 MG 24  hr tablet [id number] Yes TAKE 1 TABLET BY MOUTH TWICE  DAILY WITH MEALS [name], MD  Active Self, Pharmacy Records           Med Note STEFFI, ADELITA   Sat May 13, 2024 10:38 AM) Patient is taking the extended release twice a day.   metoprolol succinate (TOPROL-XL) 50 MG 24 hr tablet [id number] Yes Take 1 tablet (50 mg total) by mouth 2 (two) times daily. Take with or immediately following a meal. Leverne Charlies Helling, PA-C  Active Self, Pharmacy Records  Multiple Vitamins-Minerals (MULTIVITAMIN WOMEN 50+) TABS [id number]  Take 1 tablet by mouth daily.  Patient not taking: Reported on 06/16/2024   [provider]  Active Pharmacy Records, Self  pantoprazole  (PROTONIX ) 40 MG tablet [id number] Yes TAKE 1  TABLET BY MOUTH DAILY  BEFORE BREAKFAST  Patient taking differently: Take 40 mg by mouth 2 (two) times daily.   [name], MD  Active Self, Pharmacy Records           Med Note [place], [place]CONSIN R   Thu Dec 09, 2023 10:26 AM)    potassium chloride  (KLOR-CON  M) 10 MEQ tablet [id number]  Take 1 tablet (10 mEq total) by mouth daily.  Patient not taking: Reported on 06/16/2024   Arrien, Mauricio Daniel, MD  Active   pravastatin  (PRAVACHOL ) 40 MG tablet [id number] Yes Take 40 mg by mouth daily. [provider]  Active Self, Pharmacy Records  quiNIDine sulfate 200 MG tablet [id number] Yes Take 1 tablet (200 mg total) by mouth 3 (three) times daily. Aniceto Daphne CROME, NP  Active   spironolactone  (ALDACTONE ) 25 MG tablet [id number]  Take 0.5 tablets (12.5 mg total) by mouth daily.  Patient not taking: Reported on 06/16/2024   Pietro Redell RAMAN, MD  Active   Tiotropium Bromide  Monohydrate (SPIRIVA  RESPIMAT) 1.25 MCG/ACT AERS [id number] Yes Inhale 1 puff into the lungs daily. [provider]  Active Self, Pharmacy Records  torsemide  (DEMADEX ) 20 MG tablet [id number] Yes TAKE 1 TABLET BY MOUTH DAILY Crenshaw, Redell RAMAN, MD  Active Self, Pharmacy Records  valACYclovir   (VALTREX ) 1000 MG tablet [id number]  Take 1 tablet (1,000 mg total) by mouth 2 (two) times daily.  Patient not taking: Reported on 06/16/2024   Alvia Bring, DO  Active Self, Pharmacy Records            Recommendation:   Continue Current Plan of Care  Follow Up Plan:   Telephone follow up appointment date/time:  06/23/24 in the afternoon   Shona Prow RN, CCM [place]  VBCI-Population Health RN Care Manager [phone]

## 2024-06-16 NOTE — Patient Instructions (Signed)
 Visit Information  Thank you for taking time to visit with me today. Please don't hesitate to contact me if I can be of assistance to you before our next scheduled appointment.  Our next appointment is no further scheduled appointments.   Please call the care guide team at [phone] if you need to cancel or reschedule your appointment.      Please call the Suicide and Crisis Lifeline: 988 call the [place]  National Suicide Prevention Lifeline: 717-250-0319 or TTY: 845-734-9509 TTY 816-195-7308) to talk to a trained counselor call 1-800-273-TALK (toll free, 24 hour hotline) go to [facility] [address] [place] [phone]) call 911 if you are experiencing a Mental Health or Behavioral Health Crisis or need someone to talk to.  Patient verbalized understanding of Care plan and visit instructions communicated this visit  Orlean Fey, BSW [facility]  [facility] Social Worker, [employer] [facility] [phone]

## 2024-06-16 NOTE — Patient Outreach (Signed)
 Complex Care Management   Visit Note  06/16/2024  Name:  Tanya Hogan MRN: [id number] DOB: 1951/04/20  Situation: Referral received for Complex Care Management related to SDOH Barriers:  Transportation I obtained verbal consent from Patient.  Visit completed with Patient  on the phone  Background:   Past Medical History:  Diagnosis Date   Alkaline phosphatase elevation    Asthma    Breast cancer (HCC) 2011   Carpal tunnel syndrome    bilateral   Chronic HFrEF (heart failure with reduced ejection fraction) (HCC)    Cirrhosis, non-alcoholic (HCC)    Closed fracture of unspecified part of radius (alone)    DDD (degenerative disc disease), lumbar    Depression    pt denies   Diabetes mellitus without complication (HCC)    Enlarged heart    Fibromyalgia    GERD (gastroesophageal reflux disease)    History of kidney stones    HTN (hypertension)    Hypothyroidism    IBS (irritable bowel syndrome)    LBBB (left bundle branch block)    Microcytic anemia 05/14/2017   Mitral regurgitation    NICM (nonischemic cardiomyopathy) (HCC)    Pneumonia    Presence of permanent cardiac pacemaker    Pulmonary hypertension (HCC)    PVD (peripheral vascular disease)    2019   Sleep apnea    uses cpap   Tricuspid regurgitation    Ventricular tachyarrhythmia (HCC)     Assessment:  BSW outreached patient today to discuss transportation issues. During the call, patient stated that she had an appointment scheduled and was left without a ride home through her insurance after her appointment. The office had to call an [place] to send her home, which she had to pay for out-of-pocket. Patient reported that she has submitted a complaint to [employer] regarding the missed rides and is seeking reimbursement for the two rides she paid for. Patient stated that she is aware of how to schedule transportation through [employer] for future appointments but expressed that she is unable to afford  any further rides that are not covered through her insurance. BSW will send patient a list of transportation resources and remains available for further assistance as needed. BSW will close this case at this time  Associate Professor Transit  [employer] and mobility services [employer] transportation   Recommendation:   No recommendations at this time  Follow Up Plan:   Patient has met all care management goals. Care Management case will be closed. Patient has been provided contact information should new needs arise.   Orlean Fey, BSW [place]  Value [facility] Care Institute Social Worker, [employer] [facility] [phone]

## 2024-06-17 ENCOUNTER — Ambulatory Visit: Payer: Self-pay | Admitting: Student in an Organized Health Care Education/Training Program

## 2024-06-19 ENCOUNTER — Telehealth: Payer: Self-pay | Admitting: Cardiology

## 2024-06-19 NOTE — Telephone Encounter (Signed)
 Pt c/o swelling/edema: STAT if pt has developed SOB within 24 hours  If swelling, where is the swelling located?   In ankles and body  How much weight have you gained and in what time span?   3 pounds over night  Have you gained 2 pounds in a day or 5 pounds in a week?   Yes  Do you have a log of your daily weights (if so, list)?   Yes 235 pounds - 10/26 237 pounds - 10/27  Are you currently taking a fluid pill?   Yes  Are you currently SOB?  No  Have you traveled recently in a car or plane for an extended period of time?   No  Patient stated she has gained 3 pounds overnight.  Patient stated she feels sluggish.

## 2024-06-19 NOTE — Telephone Encounter (Signed)
 Call to patient to discuss concerns, 2 identifiers verified. Patient reports she had a salty meal from a restaurant 2 days in a row and noticed today her legs are swelling. She reports her weight yesterday was 185 lbs, which is what is documented at her last office visit on 06/15/24. Today her weight is 187 lbs. She has not been elevating her legs or restricting her fluids. She denies chest pain/fluttering or SOB but has been noticing chest tightness which is improved when she uses her albuterol  inhaler. Patient took an extra dose of torsemide  today and states that also helped.   She would like an appointment but has to use medical transport which requires 3 business days of notice. Offered appt with E. Monge on 06/27/24. Patient accepts. Patient states she can do labs in advance (last labs showed a slightly elevated Cr) at her PCP office (Dr. Dorothyann Sing) if we are willing to send her labs there as she lives in [place]. She is also asking if she should continue to take extra doses of torsemide  and if she should continue to take her potassium. Forwarded to Dr. Pietro for advice.

## 2024-06-21 ENCOUNTER — Telehealth: Payer: Self-pay

## 2024-06-21 ENCOUNTER — Ambulatory Visit: Payer: Self-pay

## 2024-06-21 NOTE — Telephone Encounter (Signed)
 Copied from CRM #[id number]. Topic: Clinical - Home Health Verbal Orders >> Jun 21, 2024  4:16 PM Mercer PEDLAR wrote: Caller/Agency: Delon GLENWOOD Tanya Hogan  Callback Number: [phone] Service Requested: Physical Therapy Frequency: 1 week 1, 2 week 7. Any new concerns about the patient? No

## 2024-06-21 NOTE — Telephone Encounter (Signed)
 Okay for services below.

## 2024-06-21 NOTE — Telephone Encounter (Signed)
 Spoke with pt, Aware of dr vertie recommendations.

## 2024-06-21 NOTE — Telephone Encounter (Signed)
 FYI Only or Action Required?: Action required by provider: referral request.  Patient was last seen in primary care on 06/13/2024 by [name], MD.  Called Nurse Triage reporting Eye Problem.  Symptoms began today.  Interventions attempted: Nothing.  Symptoms are: unchanged.  Triage Disposition: See PCP When Office is Open (Within 3 Days)  Patient/caregiver understands and will follow disposition?: No, wishes to speak with PCP       Copied from CRM #[id number]. Topic: Clinical - Red Word Triage >> Jun 21, 2024  4:30 PM Tanya Hogan wrote: Red Word that prompted transfer to Nurse Triage: blurred vision and a new floater         Reason for Disposition  Single floater (i.e., small speck seems to float across the eye)  (Exception: Floater(s) are a chronic symptom and this is unchanged from patient's baseline pattern.)  Answer Assessment - Initial Assessment Questions Patient declined an appointment and is requesting a referral for an eye doctor, stating that the one she was referred to before is too expensive. Please advise.      1. DESCRIPTION: How has your vision changed? (e.g., complete vision loss, blurred vision, double vision, floaters, etc.)     Floater  2. LOCATION: One or both eyes? If one, ask: Which eye?     Left eye, already had one in right eye  3. SEVERITY: Can you see anything? If Yes, ask: What can you see? (e.g., fine print)     Able to see  4. ONSET: When did this begin? Did it start suddenly or has this been gradual?     Noticed a few minutes ago 5. PATTERN: Does this come and go, or has it been constant since it started?     Constant  6. PAIN: Is there any pain in your eye(s)?  (Scale 1-10; or mild, moderate, severe)     No 7. CONTACTS-GLASSES: Do you wear contacts or glasses?     Glasses  8. CAUSE: What do you think is causing this visual problem?     Unsure  9. OTHER SYMPTOMS: Do you have any other symptoms?  (e.g., confusion, headache, arm or leg weakness, speech problems)     Intermittent blurred vision  Protocols used: Vision Loss or Change-A-AH

## 2024-06-22 ENCOUNTER — Other Ambulatory Visit: Payer: Self-pay

## 2024-06-22 ENCOUNTER — Ambulatory Visit: Payer: Self-pay

## 2024-06-22 NOTE — ED Provider Notes (Signed)
 Patient presented with dizziness nausea, floaters, visual disturbance.  She was advised by her PCP to go directly to the ER.  Patient evaluated in the lobby and was advised to to to the ER.  Offered EMS.  She feels well enough for friend to take her    Maranda Jamee Jacob, MD 06/22/24 1226

## 2024-06-22 NOTE — Telephone Encounter (Addendum)
 LM for pt to return call to see if there preference on a eye dr. Cornelious  Also advised if the blurred vision or floaters gets any worse go to UC or ER as soon as possible. Annabella Rigg, CMA

## 2024-06-22 NOTE — Telephone Encounter (Signed)
 Addressed in different encounter.

## 2024-06-22 NOTE — Telephone Encounter (Signed)
 VO given today to [place] from [facility]

## 2024-06-22 NOTE — Telephone Encounter (Signed)
 FYI Only or Action Required?: FYI only for provider: ED advised.  Patient was last seen in primary care on 06/13/2024 by [name], MD.  Called Nurse Triage reporting Dizziness and Eye Problem.  Symptoms began yesterday.  Interventions attempted: Rest, hydration, or home remedies.  Symptoms are: gradually worsening.  Triage Disposition: Go to ED Now (or PCP Triage)  Patient/caregiver understands and will follow disposition?: Yes  Reports floaters to both eyes along with dizziness and nausea. Patient states she needing to be seen today. Recommended patient to the ED due to symptoms. Patient verbalized understanding  Copied from CRM #[id number]. Topic: Clinical - Red Word Triage >> Jun 22, 2024  9:48 AM Amy B wrote: Red Word that prompted transfer to Nurse Triage: Spots in vision, dizziness Reason for Disposition  Patient sounds very sick or weak to the triager  Patient sounds very sick or weak to the triager  Answer Assessment - Initial Assessment Questions 1. DESCRIPTION: How has your vision changed? (e.g., complete vision loss, blurred vision, double vision, floaters, etc.)     floaters 2. LOCATION: One or both eyes? If one, ask: Which eye?     Floaters in both eyes 3. SEVERITY: Can you see anything? If Yes, ask: What can you see? (e.g., fine print)     Patient reports she can see but unable to read fine print 4. ONSET: When did this begin? Did it start suddenly or has this been gradual?     Started yesterday.  5. PATTERN: Does this come and go, or has it been constant since it started?     constant 6. PAIN: Is there any pain in your eye(s)?  (Scale 1-10; or mild, moderate, severe)     no 7. CONTACTS-GLASSES: Do you wear contacts or glasses?     Wears glasses 8. CAUSE: What do you think is causing this visual problem?     unsure 9. OTHER SYMPTOMS: Do you have any other symptoms? (e.g., confusion, headache, arm or leg weakness, speech  problems)     dizziness  Answer Assessment - Initial Assessment Questions 1. DESCRIPTION: Describe your dizziness.     Spinning sensation 2. VERTIGO: Do you feel like either you or the room is spinning or tilting?      Feels like she is spinning 3. LIGHTHEADED: Do you feel lightheaded? (e.g., somewhat faint, woozy, weak upon standing)     no 4. SEVERITY: How bad is it?  Can you walk?     Mild but patient states she hasn't tried to walk any distance 5. ONSET:  When did the dizziness begin?     Started last night 6. AGGRAVATING FACTORS: Does anything make it worse? (e.g., standing, change in head position)     Patient is unsure 7. CAUSE: What do you think is causing the dizziness?     unsure 8. RECURRENT SYMPTOM: Have you had dizziness before? If Yes, ask: When was the last time? What happened that time?     no 9. OTHER SYMPTOMS: Do you have any other symptoms? (e.g., earache, headache, numbness, tinnitus, vomiting, weakness)     nausea  Protocols used: Vision Loss or Change-A-AH, Dizziness - Vertigo-A-AH

## 2024-06-23 ENCOUNTER — Telehealth: Payer: Self-pay

## 2024-06-23 ENCOUNTER — Other Ambulatory Visit (HOSPITAL_COMMUNITY): Payer: Self-pay

## 2024-06-26 ENCOUNTER — Other Ambulatory Visit (HOSPITAL_COMMUNITY): Payer: Self-pay

## 2024-06-26 ENCOUNTER — Encounter: Payer: Self-pay | Admitting: Hematology and Oncology

## 2024-06-26 NOTE — Progress Notes (Signed)
Patient asked that I call back later.

## 2024-06-26 NOTE — Progress Notes (Signed)
 TCM NURSING DOCUMENTATION              TCM Requirements for Post-Discharge Contact Deadlines:  Discharge Date:: 06/23/24 7 calendar days post-discharge:: 06/30/2024 14 calendar days post-discharge:: 07/07/2024    Patient Name: Tanya Hogan Patient DOB: 1951-04-13 Patient Current Location: Home  Discharge diagnoses:  Primary discharge diagnosis:: stroke-like symptoms  MEDICATION REVIEW Medications reviewed with patient/caregiver?: Yes Is the patient having any side effects they believe may be caused by any medication additions or changes?: No Does the patient have all the medications ordered at discharge?: Yes Is the patient taking all medications as directed (includes completed medication regimen)?: No (see medication list)   APPOINTMENTS Does the patient have a primary care provider?: Yes (non-[facility] PCP - Dr. Alvan) Does the patient have an appointment scheduled with their PCP within 14 days of discharge?: Yes (07/03/24)   Future Appointments  Date Time Provider Department Center  07/07/2024  3:30 PM Fonda Minus, NP [facility] None  07/25/2024 12:45 PM Ramya CHRISTELLA Kato, MD [facility]  04/26/2025  1:30 PM Rudell MALVA Bud, MD [facility]   SELF-MANAGEMENT Self Management Has patient been referred to home health?: Yes What is the home health agency?: [facility] Has home health visited the patient with 72 hours of discharge?: Yes Was durable medical equipment (DME) ordered?: No Does the patient use oxygen ?: No Is the patient having any difficulties with ADLs?: No    PATIENT TEACHING Discharge Instructions Interventions:: Reviewed instructions with patient/caregiver What is the patient's perception of their health status since discharge?: Improving Is the patient/caregiver able to teach back signs and symptoms related to disease process for when to call 911?: Yes Is the patient/caregiver able to teach back the hierarchy of who to call/visit for  symptoms/problems? PCP, specialist, home health nurse, urgent care, ED, 911: Yes Is the patient a tobacco user?: No  Patient reports she has been having severe diarrhea. She reports the [facility] RN told her to try Imodium. Encouraged patient to contact her PCP about this today, as she is concerned that this is due to her medications.  Patient's medication list states that she is to take 200mg  of Amiodarone  daily; however, the patient states she is taking 400 mg of amiodarone  daily. She isn't sure who told her to take it this way. Strongly encouraged her to contact her cardiologist today, as soon as possible, to clarify this dosage.  She reports concerns about her Protonix . She reports her ENT specialist prescribes this. Encouraged her to contact their office to discuss these concerns.  Patient reports a black spot in her vision that gets big and little and that she has a hard time reading small print. She reports she went into the hospital with these issues and they have not gotten better. Of note, she was referred to ophthalmology for visual disturbances. She denies worsening of these symptoms.

## 2024-06-27 ENCOUNTER — Ambulatory Visit: Attending: Nurse Practitioner | Admitting: Nurse Practitioner

## 2024-06-27 ENCOUNTER — Encounter: Payer: Self-pay | Admitting: Nurse Practitioner

## 2024-06-27 VITALS — BP 103/68 | HR 91 | Ht 61.0 in | Wt 186.4 lb

## 2024-06-27 DIAGNOSIS — I10 Essential (primary) hypertension: Secondary | ICD-10-CM

## 2024-06-27 DIAGNOSIS — E1169 Type 2 diabetes mellitus with other specified complication: Secondary | ICD-10-CM

## 2024-06-27 DIAGNOSIS — I472 Ventricular tachycardia, unspecified: Secondary | ICD-10-CM

## 2024-06-27 DIAGNOSIS — G4733 Obstructive sleep apnea (adult) (pediatric): Secondary | ICD-10-CM

## 2024-06-27 DIAGNOSIS — E038 Other specified hypothyroidism: Secondary | ICD-10-CM

## 2024-06-27 DIAGNOSIS — I340 Nonrheumatic mitral (valve) insufficiency: Secondary | ICD-10-CM

## 2024-06-27 DIAGNOSIS — E782 Mixed hyperlipidemia: Secondary | ICD-10-CM

## 2024-06-27 DIAGNOSIS — I428 Other cardiomyopathies: Secondary | ICD-10-CM

## 2024-06-27 DIAGNOSIS — E785 Hyperlipidemia, unspecified: Secondary | ICD-10-CM

## 2024-06-27 MED ORDER — METOPROLOL SUCCINATE ER 50 MG PO TB24: 50.0000 mg | ORAL_TABLET | Freq: Two times a day (BID) | ORAL | 3 refills | Status: DC

## 2024-06-27 NOTE — Progress Notes (Unsigned)
 Office Visit    Patient Name: Tanya Hogan Date of Encounter: 06/27/2024  Primary Care Provider:  [name], MD Primary Cardiologist:  Redell Shallow, MD  Chief Complaint    73 year old female history of NICM s/p CRT-D, chronic systolic heart failure, VT, mitral valve regurgitation, hypertension, type 2 diabetes, hypothyroidism, NASH, OSA, fibromyalgia, IBS, and GERD who presents for follow-up related to heart failure.  Past Medical History    Past Medical History:  Diagnosis Date   Alkaline phosphatase elevation    Asthma    Breast cancer (HCC) 2011   Carpal tunnel syndrome    bilateral   Chronic HFrEF (heart failure with reduced ejection fraction) (HCC)    Cirrhosis, non-alcoholic (HCC)    Closed fracture of unspecified part of radius (alone)    DDD (degenerative disc disease), lumbar    Depression    pt denies   Diabetes mellitus without complication (HCC)    Enlarged heart    Fibromyalgia    GERD (gastroesophageal reflux disease)    History of kidney stones    HTN (hypertension)    Hypothyroidism    IBS (irritable bowel syndrome)    LBBB (left bundle branch block)    Microcytic anemia 05/14/2017   Mitral regurgitation    NICM (nonischemic cardiomyopathy) (HCC)    Pneumonia    Presence of permanent cardiac pacemaker    Pulmonary hypertension (HCC)    PVD (peripheral vascular disease)    2019   Sleep apnea    uses cpap   Tricuspid regurgitation    Ventricular tachyarrhythmia [facility])    Past Surgical History:  Procedure Laterality Date   BIV ICD GENERATOR CHANGEOUT N/A 05/31/2024   Procedure: BIV ICD GENERATOR CHANGEOUT;  Surgeon: Almetta Donnice LABOR, MD;  Location: [facility] INVASIVE CV LAB;  Service: Cardiovascular;  Laterality: N/A;   BIV PACEMAKER GENERATOR CHANGEOUT N/A 11/03/2021   Procedure: BIV PACEMAKER GENERATOR CHANGEOUT;  Surgeon: Waddell Danelle ORN, MD;  Location: [facility] INVASIVE CV LAB;  Service: Cardiovascular;  Laterality: N/A;   BREAST  LUMPECTOMY Left 2011   COLONOSCOPY     FOOT SURGERY     KNEE SURGERY     LEAD INSERTION N/A 05/31/2024   Procedure: LEAD INSERTION;  Surgeon: Almetta Donnice LABOR, MD;  Location: [facility] INVASIVE CV LAB;  Service: Cardiovascular;  Laterality: N/A;   LEAD REVISION/REPAIR N/A 11/03/2021   Procedure: LEAD REVISION/REPAIR;  Surgeon: Waddell Danelle ORN, MD;  Location: [facility] INVASIVE CV LAB;  Service: Cardiovascular;  Laterality: N/A;   MASTECTOMY Right    05/2019   PACEMAKER GENERATOR CHANGE N/A 08/23/2014   Procedure: PACEMAKER GENERATOR CHANGE;  Surgeon: Danelle ORN Waddell, MD;  Location: [facility] CATH LAB;  Service: Cardiovascular;  Laterality: N/A;   PACEMAKER PLACEMENT  2009   [facility]   RECTAL SURGERY  1976   REDUCTION MAMMAPLASTY Left    05/2019   RIGHT/LEFT HEART CATH AND CORONARY ANGIOGRAPHY N/A 05/18/2024   Procedure: RIGHT/LEFT HEART CATH AND CORONARY ANGIOGRAPHY;  Surgeon: Wendel Lurena POUR, MD;  Location: [facility] INVASIVE CV LAB;  Service: Cardiovascular;  Laterality: N/A;   TOTAL SHOULDER ARTHROPLASTY Right 03/10/2018   TOTAL SHOULDER ARTHROPLASTY Right 03/10/2018   Procedure: RIGHT TOTAL SHOULDER ARTHROPLASTY;  Surgeon: Dozier Soulier, MD;  Location: [facility] OR;  Service: Orthopedics;  Laterality: Right;   TUBAL LIGATION     UPPER EXTREMITY VENOGRAPHY  05/18/2024   Procedure: UPPER EXTREMITY VENOGRAPHY;  Surgeon: Wendel Lurena POUR, MD;  Location: [facility] INVASIVE CV LAB;  Service: Cardiovascular;;  Allergies  Allergies  Allergen Reactions   Injectafer [Ferric Carboxymaltose] Shortness Of Breath    Mild SOB following injectafer infusion    Penicillins Anaphylaxis and Rash   Accupril [Quinapril Hcl] Other (See Comments)    Hyperkalemia=high potassium level   Celebrex [Celecoxib] Other (See Comments)    Hx hematemesis, bleeding ulcer   Cozaar  [Losartan  Potassium] Itching   Dml Forte Rash   Entresto [Sacubitril-Valsartan] Other (See Comments)    Hyperkalemia   Flonase  [Fluticasone ] Other (See  Comments)    Hx nasal ulcer   Lactose Intolerance (Gi) Diarrhea   Lipitor [Atorvastatin ] Other (See Comments)    Myalgias    Nasonex  [Mometasone  Furoate] Other (See Comments)    Epistaxis    Nsaids Other (See Comments)    Hx hematemesis, bleeding ulcer   Advil [Ibuprofen] Nausea And Vomiting    Hx hematemesis, bleeding ulcer   Baking Soda-Fluoride [Sodium Fluoride] Itching and Rash   Bayer Aspirin  [Aspirin ] Other (See Comments)    Hx hematemesis, bleeding ulcer   Chamomile Hives   Clindamycin /Lincomycin Rash   Codeine Nausea And Vomiting   Lasix  [Furosemide ] Other (See Comments)    Tinnitus  Hypotension   Miralax  [Polyethylene Glycol] Other (See Comments)    Leaky gut syndrome   Quinine Rash   Reclast  [Zoledronic  Acid] Rash and Other (See Comments)    Tremors    Rifadin [Rifampin] Other (See Comments)    Unknown reaction   Singulair  [Montelukast ] Other (See Comments)    Dizziness    Sulfa Antibiotics Rash   Ultram  [Tramadol ] Anxiety and Rash   Vesicare  [Solifenacin ] Other (See Comments)    Urinary retention   Wellbutrin  [Bupropion ] Other (See Comments)    Myalgias  Skin crawling sensation   Wool Alcohol [Lanolin] Rash     Labs/Other Studies Reviewed    The following studies were reviewed today:  Cardiac Studies & Procedures   ______________________________________________________________________________________________ CARDIAC CATHETERIZATION  CARDIAC CATHETERIZATION 05/18/2024  Conclusion 1.  Normal right dominant circulation with no obstructive coronary artery disease. 2.  Venography from the left axillary demonstrated that the subclavian vein proximal to the insertion site of multiple leads is patent however a wire and catheter could not cross the this confluence. 3.  Fick cardiac output of 5.7 L/min and Fick cardiac index of 3.1 L/min/m with following hemodynamics: Right atrial pressure mean of 8 mmHg Right ventricular pressure 52/1 with an end-diastolic  pressure of 10 mmHg Wedge pressure mean of 23 mmHg with V waves to 29 mmHg PA pressure of 51/27 with mean of 37 mmHg PVR of PVR of 2.5 Woods units PA pulsatility index of 3 4.  LVEDP of 21 to 24 mmHg across the cardiac cycle  Recommendation: Medical therapy  Findings Coronary Findings Diagnostic  Dominance: Right  No diagnostic findings have been documented. Intervention  No interventions have been documented.     ECHOCARDIOGRAM  ECHOCARDIOGRAM COMPLETE 05/13/2024  Narrative ECHOCARDIOGRAM REPORT    Patient Name:   NYSA SARIN Date of Exam: 05/13/2024 Medical Rec #:  [id number]       Height:       61.0 in Accession #:    [id number]      Weight:       194.0 lb Date of Birth:  1951/03/23        BSA:          1.864 m Patient Age:    73 years        BP:  101/71 mmHg Patient Gender: F               HR:           80 bpm. Exam Location:  Inpatient  Procedure: 2D Echo and Intracardiac Opacification Agent (Both Spectral and Color Flow Doppler were utilized during procedure).  Indications:    Atrial Flutter  History:        Patient has prior history of Echocardiogram examinations. CHF; Risk Factors:Hypertension.  Sonographer:    Charmaine Gaskins Referring Phys: [id number] NKIRU C OSUDE  IMPRESSIONS   1. No apical thrombus with Definity  contrast. Left ventricular ejection fraction, by estimation, is 20 to 25%. Left ventricular ejection fraction by 2D MOD biplane is 25.1 %. The left ventricle has severely decreased function. The left ventricle demonstrates global hypokinesis. The left ventricular internal cavity size was moderately dilated. Left ventricular diastolic parameters are consistent with Grade II diastolic dysfunction (pseudonormalization). Elevated left ventricular end-diastolic pressure. 2. Right ventricular systolic function is normal. The right ventricular size is moderately enlarged. There is severely elevated pulmonary artery systolic pressure. The  estimated right ventricular systolic pressure is 64.6 mmHg. 3. Left atrial size was severely dilated. 4. Right atrial size was severely dilated. 5. The mitral valve is degenerative. Mild mitral valve regurgitation. Moderate mitral annular calcification. 6. The tricuspid valve is abnormal. Tricuspid valve regurgitation is mild to moderate. 7. The aortic valve is tricuspid. Aortic valve regurgitation is not visualized. Aortic valve sclerosis/calcification is present, without any evidence of aortic stenosis. 8. The inferior vena cava is dilated in size with <50% respiratory variability, suggesting right atrial pressure of 15 mmHg.  Comparison(s): Changes from prior study are noted. 10/30/2022: LVEF 20-25%, global HK.  FINDINGS Left Ventricle: No apical thrombus with Definity  contrast. Left ventricular ejection fraction, by estimation, is 20 to 25%. Left ventricular ejection fraction by 2D MOD biplane is 25.1 %. The left ventricle has severely decreased function. The left ventricle demonstrates global hypokinesis. Definity  contrast agent was given IV to delineate the left ventricular endocardial borders. The left ventricular internal cavity size was moderately dilated. There is no left ventricular hypertrophy. Left ventricular diastolic parameters are consistent with Grade II diastolic dysfunction (pseudonormalization). Elevated left ventricular end-diastolic pressure.  Right Ventricle: The right ventricular size is moderately enlarged. No increase in right ventricular wall thickness. Right ventricular systolic function is normal. There is severely elevated pulmonary artery systolic pressure. The tricuspid regurgitant velocity is 3.52 m/s, and with an assumed right atrial pressure of 15 mmHg, the estimated right ventricular systolic pressure is 64.6 mmHg.  Left [facility]: Left atrial size was severely dilated.  Right [facility]: Right atrial size was severely dilated.  Pericardium: There is no evidence of  pericardial effusion.  Mitral Valve: The mitral valve is degenerative in appearance. There is mild calcification of the posterior mitral valve leaflet(s). Moderate mitral annular calcification. Mild mitral valve regurgitation.  Tricuspid Valve: The tricuspid valve is abnormal. Tricuspid valve regurgitation is mild to moderate.  Aortic Valve: The aortic valve is tricuspid. Aortic valve regurgitation is not visualized. Aortic valve sclerosis/calcification is present, without any evidence of aortic stenosis.  Pulmonic Valve: The pulmonic valve was grossly normal. Pulmonic valve regurgitation is trivial.  Aorta: The aortic root and ascending aorta are structurally normal, with no evidence of dilitation.  Venous: The inferior vena cava is dilated in size with less than 50% respiratory variability, suggesting right atrial pressure of 15 mmHg.  IAS/Shunts: No atrial level shunt detected by color flow Doppler.  Additional Comments: A device lead is visualized.   LEFT VENTRICLE PLAX 2D                        Biplane EF (MOD) LVIDd:         6.20 cm         LV Biplane EF:   Left LVIDs:         5.50 cm                          ventricular LV PW:         1.00 cm                          ejection LV IVS:        0.90 cm                          fraction by LVOT diam:     2.10 cm                          2D MOD LV SV:         76                               biplane is LV SV Index:   41                               25.1 %. LVOT Area:     3.46 cm Diastology LV e' medial:    3.08 cm/s LV Volumes (MOD)               LV E/e' medial:  28.6 LV vol d, MOD    287.0 ml      LV e' lateral:   5.51 cm/s A2C:                           LV E/e' lateral: 16.0 LV vol d, MOD    269.0 ml A4C: LV vol s, MOD    215.0 ml A2C: LV vol s, MOD    197.0 ml A4C: LV SV MOD A2C:   72.0 ml LV SV MOD A4C:   269.0 ml LV SV MOD BP:    69.3 ml  RIGHT VENTRICLE RV Basal diam:  3.90 cm RV Mid diam:    4.00 cm RV S  prime:     14.50 cm/s  LEFT ATRIUM              Index        RIGHT ATRIUM           Index LA diam:        5.00 cm  2.68 cm/m   RA Area:     26.00 cm LA Vol (A2C):   134.0 ml 71.87 ml/m  RA Volume:   91.80 ml  49.24 ml/m LA Vol (A4C):   122.0 ml 65.43 ml/m LA Biplane Vol: 135.0 ml 72.41 ml/m AORTIC VALVE LVOT Vmax:   111.00 cm/s LVOT Vmean:  84.800 cm/s LVOT VTI:    0.220 m  AORTA Ao Asc diam: 3.00 cm  MITRAL VALVE  TRICUSPID VALVE MV Area (PHT): 4.99 cm     TR Peak grad:   49.6 mmHg MV Decel Time: 152 msec     TR Vmax:        352.00 cm/s MV E velocity: 88.00 cm/s MV A velocity: 115.00 cm/s  SHUNTS MV E/A ratio:  0.77         Systemic VTI:  0.22 m Systemic Diam: 2.10 cm  Vinie Maxcy MD Electronically signed by Vinie Maxcy MD Signature Date/Time: 05/13/2024/3:28:01 PM    Final          ______________________________________________________________________________________________     Recent Labs: 06/05/2024: ALT 28; B Natriuretic Peptide 1,051.8 06/06/2024: Hemoglobin 12.8; Platelets 194; TSH 3.548 06/07/2024: Magnesium  2.1 06/13/2024: BUN 14; Creatinine, Ser 1.01; Potassium 4.2; Sodium 140  Recent Lipid Panel    Component Value Date/Time   CHOL 180 03/29/2023 1454   TRIG 138 03/29/2023 1454   HDL 84 03/29/2023 1454   CHOLHDL 2.1 03/29/2023 1454   CHOLHDL 2.3 04/16/2021 0000   VLDL 26 02/18/2017 0939   LDLCALC 73 03/29/2023 1454   LDLCALC 74 04/16/2021 0000    History of Present Illness    73 year old female with the above past medical history including NICM s/p CRT-D, chronic systolic heart failure, VT, mitral valve regurgitation, hypertension, type 2 diabetes, hypothyroidism, NASH, OSA, fibromyalgia, IBS, and GERD.  Previously followed by Dr. Hobart, now followed by Dr. Pietro.  She has a history of nonischemic cardiomyopathy with device placement in 2000 performed.  She had an upgrade to CRT-D in 2013.  Cardiac catheterization  in June 2021 showed normal coronary arteries, EF 35 to 40%.  She has not tolerated vasodilators to 2 hypotension.  Digoxin  was discontinued in the setting of rash.  She has also not tolerated SGLT2 inhibitors in the setting of fatigue, concern for dehydration.  She was previously seen in advanced heart failure clinic and was not felt to be a candidate for advanced therapies including LVAD or home inotropes.  She has a history of VT, treated with amiodarone .  Echocardiogram in June 2024 at [facility] showed EF 20 to 25%, mild LV enlargement, moderate left atrial enlargement, mild mitral valve regurgitation.  She was hospitalized in September 2025 in setting of VT storm.  Cardiac catheterization during hospitalization in 04/2024 showed no evidence of coronary artery disease.  Echocardiogram in 04/2024 showed EF 20 to 25%, severely decreased LV function, LV global hypokinesis, G2 DD, normal RV systolic function, mild mitral valve regurgitation, mild to moderate tricuspid valve regurgitation, aortic valve sclerosis without evidence of aortic stenosis.  She was loaded on IV amiodarone  and Mexitil .  She was readmitted in October 2025 in the setting of VT storm.  She underwent upgrade of CRT-P to CRT-D with the addition of an RV lead and capping the LV lead by Dr. Almetta.  She was started on quinidine.  She returned to the hospital a week later with recurrent VT storm, she was switched back from quinidine gluconate to quinidine sulfate with improvement. She was last seen in the office on 06/15/2024 and was stable from a cardiac standpoint.  She reported mild dyspnea on exertion.  She was hospitalized from 06/22/2024 to 06/23/2024 at [facility] in the setting of acute vision change as.  CT of the head was negative for acute process.  MRI was deferred as this was not compatible with her device.  Repeat echocardiogram showed EF 20 to 25%, severe LV hypokinesis, mildly reduced RV systolic function, mild mitral valve regurgitation,  mild tricuspid valve regurgitation.  Cardiology was consulted given concern for atrial fibrillation, however, it was noted that prior documentation of atrial fibrillation was incorrect.  No arrhythmias were observed during her hospital stay.  She was discharged home in stable condition on 06/23/2024.  She presents today for follow-up.  Since her last visit and recent hospitalization She has been stable from a cardiac standpoint.  She denies any significant palpitations, denies chest pain, dizziness, edema, PND, apnea, weight gain.  She does note generalized fatigue.  Decreased activity tolerance.  Possible TIA.  Recent labs were stable.  She is planning to see her eye doctor in January.  Lipitor was recently increased.  Will update fasting lipids, CMET in 6 to 8 weeks.  Follow-up with Dr. Pietro in 2 months, follow-up as scheduled with APP in 09/2024.  On BiPAP followed by pulmonology.  Has had some GI upset, pending follow-up with GI/PCP.  NICM/chronic systolic heart failure: Most recent echocardiogram showed EF 20 to 25%, severe LV hypokinesis, mildly reduced RV systolic function, mild mitral valve regurgitation, mild tricuspid valve regurgitation.  s/p CRT-D, VT: Mitral valve regurgitation:  Hypertension: Hyperlipidemia: LDL was 60 in 05/2024.  Type 2 diabetes: A1c was 5.7 in 05/2024.  Hypothyroidism: TSH was 1.63 in 05/2024.  OSA: Adherent to CPAP.  Disposition: Follow-up in   Home Medications    Current Outpatient Medications  Medication Sig Dispense Refill   acetaminophen  (TYLENOL ) 325 MG tablet Take 325 mg by mouth every 6 (six) hours as needed for mild pain (pain score 1-3).     albuterol  (PROVENTIL ) (2.5 MG/3ML) 0.083% nebulizer solution Take 2.5 mg by nebulization every 6 (six) hours as needed for wheezing or shortness of breath.     alendronate  (FOSAMAX ) 70 MG tablet TAKE 1 TABLET BY MOUTH EVERY 7  DAYS WITH FULL GLASS OF WATER  ON AN EMPTY STOMACH 12 tablet 3   amiodarone  (PACERONE )  200 MG tablet Take 1 tablet (200 mg total) by mouth 2 (two) times daily. (Patient taking differently: Take 200 mg by mouth daily.) 60 tablet 6   aspirin  81 MG chewable tablet Chew 81 mg by mouth daily.     Calcium  Carbonate-Vit D-Min (CALCIUM  600+D PLUS MINERALS) 600-400 MG-UNIT TABS 1 tab p.o. twice daily 180 tablet 3   Continuous Glucose Receiver (FREESTYLE LIBRE 3 READER) DEVI Use for continuous glucose monitoring 1 each 0   Continuous Glucose Sensor (FREESTYLE LIBRE 3 SENSOR) MISC by Does not apply route. Change every 14 days     EPINEPHrine  0.3 mg/0.3 mL IJ SOAJ injection Inject 0.3 mg into the muscle as needed for anaphylaxis. 2 each 1   estradiol (ESTRACE) 0.1 MG/GM vaginal cream Place 1 Applicatorful vaginally 3 (three) times a week.     fexofenadine  (ALLEGRA ) 180 MG tablet Take 180 mg by mouth daily.     fluticasone -salmeterol (WIXELA INHUB ) 250-50 MCG/ACT AEPB Inhale 1 puff into the lungs in the morning and at bedtime.     GEMTESA 75 MG TABS Take 75 mg by mouth daily.     glucose blood test strip To be used to check blood glucose Dx:E11.8 FreeStyle Precision blood glucose for Freestyle Libre 2 100 each 12   isosorbide  mononitrate (IMDUR ) 30 MG 24 hr tablet TAKE 1 TABLET BY MOUTH DAILY 100 tablet 2   levothyroxine  (SYNTHROID ) 112 MCG tablet TAKE 1 TABLET BY MOUTH DAILY 90 tablet 1   lubiprostone (AMITIZA) 24 MCG capsule Take 24 mcg by mouth 2 (two) times daily with a meal.  Patient reports provider has decreased to every three days.     metFORMIN  (GLUCOPHAGE -XR) 500 MG 24 hr tablet TAKE 1 TABLET BY MOUTH TWICE  DAILY WITH MEALS 200 tablet 2   metoprolol succinate (TOPROL-XL) 50 MG 24 hr tablet Take 1 tablet (50 mg total) by mouth 2 (two) times daily. Take with or immediately following a meal. 60 tablet 5   Multiple Vitamins-Minerals (MULTIVITAMIN WOMEN 50+) TABS Take 1 tablet by mouth daily.     pantoprazole  (PROTONIX ) 40 MG tablet TAKE 1 TABLET BY MOUTH DAILY  BEFORE BREAKFAST (Patient  taking differently: Take 40 mg by mouth 2 (two) times daily.) 90 tablet 3   pravastatin  (PRAVACHOL ) 40 MG tablet Take 40 mg by mouth daily. (Patient taking differently: Take 40 mg by mouth in the morning and at bedtime.)     quiNIDine sulfate 200 MG tablet Take 1 tablet (200 mg total) by mouth 3 (three) times daily. 90 tablet 6   spironolactone  (ALDACTONE ) 25 MG tablet Take 0.5 tablets (12.5 mg total) by mouth daily.     Tiotropium Bromide  Monohydrate (SPIRIVA  RESPIMAT) 1.25 MCG/ACT AERS Inhale 1 puff into the lungs daily.     torsemide  (DEMADEX ) 20 MG tablet TAKE 1 TABLET BY MOUTH DAILY 100 tablet 2   valACYclovir  (VALTREX ) 1000 MG tablet Take 1 tablet (1,000 mg total) by mouth 2 (two) times daily. 14 tablet 2   albuterol  (VENTOLIN  HFA) 108 (90 Base) MCG/ACT inhaler Inhale 2 puffs into the lungs every 6 (six) hours as needed for wheezing or shortness of breath. (Patient not taking: Reported on 06/27/2024)     potassium chloride  (KLOR-CON  M) 10 MEQ tablet Take 1 tablet (10 mEq total) by mouth daily. (Patient not taking: Reported on 06/27/2024) 30 tablet 0   No current facility-administered medications for this visit.     Review of Systems    ***.  All other systems reviewed and are otherwise negative except as noted above.    Physical Exam    VS:  BP 103/68 (BP Location: Right Arm, Patient Position: Sitting, Cuff Size: Normal)   Pulse 91   Ht 5' 1 (1.549 m)   Wt 186 lb 6.4 oz (84.6 kg)   SpO2 96%   BMI 35.22 kg/m  , BMI Body mass index is 35.22 kg/m.     GEN: Well nourished, well developed, in no acute distress. HEENT: normal. Neck: Supple, no JVD, carotid bruits, or masses. Cardiac: RRR, no murmurs, rubs, or gallops. No clubbing, cyanosis, edema.  Radials/DP/PT 2+ and equal bilaterally.  Respiratory:  Respirations regular and unlabored, clear to auscultation bilaterally. GI: Soft, nontender, nondistended, BS + x 4. MS: no deformity or atrophy. Skin: warm and dry, no rash. Neuro:   Strength and sensation are intact. Psych: Normal affect.  Accessory Clinical Findings    ECG personally reviewed by me today -    - no acute changes.   Lab Results  Component Value Date   WBC 5.8 06/06/2024   HGB 12.8 06/06/2024   HCT 40.5 06/06/2024   MCV 100.5 (H) 06/06/2024   PLT 194 06/06/2024   Lab Results  Component Value Date   CREATININE 1.01 (H) 06/13/2024   BUN 14 06/13/2024   NA 140 06/13/2024   K 4.2 06/13/2024   CL 100 06/13/2024   CO2 25 06/13/2024   Lab Results  Component Value Date   ALT 28 06/05/2024   AST 41 06/05/2024   ALKPHOS 99 06/05/2024   BILITOT 0.6 06/05/2024  Lab Results  Component Value Date   CHOL 180 03/29/2023   HDL 84 03/29/2023   LDLCALC 73 03/29/2023   TRIG 138 03/29/2023   CHOLHDL 2.1 03/29/2023    Lab Results  Component Value Date   HGBA1C 5.5 06/05/2024    Assessment & Plan    1.  ***      Damien JAYSON Braver, NP 06/27/2024, 2:55 PM

## 2024-06-27 NOTE — Patient Instructions (Signed)
 Medication Instructions:  Your physician recommends that you continue on your current medications as directed. Please refer to the Current Medication list given to you today.  *If you need a refill on your cardiac medications before your next appointment, please call your pharmacy*  Lab Work: Fasting Lipid panel & CMET today 6-8 weeks  Testing/Procedures: NONE ordered at this time of appointment   Follow-Up: At [facility], you and your health needs are our priority.  As part of our continuing mission to provide you with exceptional heart care, our providers are all part of one team.  This team includes your primary Cardiologist (physician) and Advanced Practice Providers or APPs (Physician Assistants and Nurse Practitioners) who all work together to provide you with the care you need, when you need it.  Your next appointment:   2 month(s)  Provider:   Redell Shallow, MD    We recommend signing up for the patient portal called MyChart.  Sign up information is provided on this After Visit Summary.  MyChart is used to connect with patients for Virtual Visits (Telemedicine).  Patients are able to view lab/test results, encounter notes, upcoming appointments, etc.  Non-urgent messages can be sent to your provider as well.   To learn more about what you can do with MyChart, go to [email or web address].

## 2024-06-30 ENCOUNTER — Encounter: Payer: Self-pay | Admitting: Nurse Practitioner

## 2024-07-03 ENCOUNTER — Encounter: Payer: Self-pay | Admitting: Family Medicine

## 2024-07-03 ENCOUNTER — Ambulatory Visit: Admitting: Family Medicine

## 2024-07-03 ENCOUNTER — Other Ambulatory Visit: Payer: Self-pay | Admitting: Family Medicine

## 2024-07-03 VITALS — BP 108/50 | HR 60 | Ht 61.0 in | Wt 188.1 lb

## 2024-07-03 DIAGNOSIS — E038 Other specified hypothyroidism: Secondary | ICD-10-CM

## 2024-07-03 DIAGNOSIS — H43393 Other vitreous opacities, bilateral: Secondary | ICD-10-CM | POA: Diagnosis not present

## 2024-07-03 DIAGNOSIS — I5022 Chronic systolic (congestive) heart failure: Secondary | ICD-10-CM

## 2024-07-03 DIAGNOSIS — R197 Diarrhea, unspecified: Secondary | ICD-10-CM | POA: Diagnosis not present

## 2024-07-03 MED ORDER — AMIODARONE HCL 200 MG PO TABS: 200.0000 mg | ORAL_TABLET | Freq: Every day | ORAL | Status: DC

## 2024-07-03 NOTE — Patient Instructions (Signed)
 HOLD Amitiza since you are having diarrhea right now.

## 2024-07-03 NOTE — Progress Notes (Signed)
 Established Patient Office Visit  Patient ID: Tanya Hogan, female    DOB: [birth year]  Age: 73 y.o. MRN: [id number] PCP: [name], MD  Chief Complaint  Patient presents with   Hospitalization Follow-up    Subjective:     HPI  Discussed the use of AI scribe software for clinical note transcription with the patient, who gave verbal consent to proceed.   Here for hospital follow up from [facility]  Admit Date: June 22, 2024  Discharge Date: 06/23/24  Hospital discharge summary not available at time of visit.  Not dictated yet.  She initially presented to the emergency department at [facility] health waning of visual changes.  She had previous floaters in her eye particularly the left side but they got worse and she sought care she thought maybe she was having a stroke.  She had EKG, chest x-ray, CT head neck, and echocardiogram  History of Present Illness Tanya Hogan is a 73 year old female with a pacemaker who presents with concerns about fluid retention and vision changes.  Fluid retention and edema - Limited energy, lasting about half a day - Lower extremity swelling present - Recent increase in weight by two pounds, current weight 185 pounds (two pounds higher than usual) - Recent change in diuretic medication - Uncertain about adjusting diuretic dose without cardiology input  Electrolyte management and medication tolerance - Stopped potassium supplements due to stomach pain - Concerned about maintaining potassium levels while on diuretics - Current medications include carvedilol  and clonidine (clonidine taken three times daily) - Amiodarone  dose reduced to one tablet daily after initially taking two tablets daily for one month  Visual disturbances - Significant vision impairment with multiple floaters in both eyes - Difficulty seeing clearly - Previous eye exam was costly; seeking an ophthalmologist within insurance network - One physician suggested  possible macular degeneration, while another disagreed - Awaiting insurance funds to schedule an eye exam  Gastrointestinal symptoms - Diarrhea since last hospitalization for gastrointestinal issue - Diarrhea attributed to antibiotics received during hospital stay (liquid antibiotics for five days) - Imodium provides symptom relief until consumption of fruits or vegetables, which triggers recurrence of diarrhea  Cerebrovascular risk and dietary concerns - History of transient ischemic attack (mini-stroke) - Aware of multiple risk factors for stroke - Concerned about blood lipid levels and dietary habits - Difficulty maintaining a healthy diet due to ongoing gastrointestinal symptoms     ROS    Objective:     BP (!) 108/50   Pulse 60   Ht 5' 1 (1.549 m)   Wt 188 lb 1.9 oz (85.3 kg)   SpO2 99%   BMI 35.54 kg/m    Physical Exam Vitals and nursing note reviewed.  Constitutional:      Appearance: Normal appearance.  HENT:     Head: Normocephalic and atraumatic.  Eyes:     Conjunctiva/sclera: Conjunctivae normal.  Cardiovascular:     Rate and Rhythm: Normal rate and regular rhythm.  Pulmonary:     Effort: Pulmonary effort is normal.     Breath sounds: Normal breath sounds.  Skin:    General: Skin is warm and dry.  Neurological:     Mental Status: She is alert.  Psychiatric:        Mood and Affect: Mood normal.      No results found for any visits on 07/03/24.    The ASCVD Risk score (Arnett DK, et al., 2019) failed to calculate for the  following reasons:   Risk score cannot be calculated because patient has a medical history suggesting prior/existing ASCVD    Assessment & Plan:   Problem List Items Addressed This Visit       Cardiovascular and Mediastinum   Chronic systolic heart failure (HCC)   Relevant Medications   amiodarone  (PACERONE ) 200 MG tablet   Other Visit Diagnoses       Diarrhea, unspecified type    -  Primary   Relevant Orders   GI  Profile, Stool, PCR     Vitreous floaters of both eyes       Relevant Orders   Ambulatory referral to Ophthalmology       Assessment and Plan Assessment & Plan Heart failure with cardiac pacemaker and atrial fibrillation Improved energy with pacemaker, slight swelling noted. Concern for fluid overload and potassium levels due to diuretics. Weight fluctuations suggest fluid retention. - Monitor weight daily. - Take extra torsemide  if weight elevated next morning. - Consult cardiologist for fluid and potassium management.  Chronic visual disturbance with floaters and suspected macular degeneration Significant visual disturbances affecting daily activities. Previous ophthalmologist did not confirm macular degeneration. Seeking new ophthalmologist due to dissatisfaction and insurance issues. - Referred to Dr. Gennie at [facility] for comprehensive eye exam. - Schedule appointment on a Friday for convenience.  Chronic diarrhea post-antibiotics, rule out Clostridioides difficile infection Intermittent diarrhea since hospitalization, possibly antibiotic-related. Concern for Clostridioides difficile infection. - Ordered stool test for Clostridioides difficile, salmonella, and shigella.  She was on antibiotics during the hospitalization before which is when the diarrhea really started so we will check for C. difficile as well - Hold Amitiza.  - She also has a history of overflow diarrhea which could be contributing but this sounds a little different.   History of transient ischemic attack (TIA) Aware of multiple risk factors for TIA. - Continue lifestyle modifications: increase vegetables and lean proteins, reduce sugar and carbohydrates.    Return in about 2 months (around 09/02/2024) for Diabetes follow-up.    Dorothyann Byars, MD [facility] at [facility]

## 2024-07-05 DIAGNOSIS — I5022 Chronic systolic (congestive) heart failure: Secondary | ICD-10-CM | POA: Diagnosis not present

## 2024-07-05 DIAGNOSIS — I110 Hypertensive heart disease with heart failure: Secondary | ICD-10-CM | POA: Diagnosis not present

## 2024-07-05 DIAGNOSIS — I472 Ventricular tachycardia, unspecified: Secondary | ICD-10-CM | POA: Diagnosis not present
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
## 2024-07-10 ENCOUNTER — Telehealth: Payer: Self-pay

## 2024-07-10 NOTE — Telephone Encounter (Signed)
 Did she hold her Lubiprostone? She can hold her metformin  for a couple days too if needed.

## 2024-07-10 NOTE — Telephone Encounter (Signed)
 Copied from CRM #[id number]. Topic: Clinical - Medical Advice >> Jul 10, 2024  9:00 AM Tanya Hogan wrote: Reason for CRM: Patient is currently having diarrhea and a bit nausea, took Imodium.   Please Advise.

## 2024-07-13 ENCOUNTER — Encounter (HOSPITAL_COMMUNITY): Payer: Self-pay | Admitting: Emergency Medicine

## 2024-07-13 ENCOUNTER — Emergency Department (HOSPITAL_COMMUNITY)

## 2024-07-13 ENCOUNTER — Inpatient Hospital Stay (HOSPITAL_COMMUNITY)
Admission: EM | Admit: 2024-07-13 | Discharge: 2024-07-15 | DRG: 291 | Disposition: A | Discharge status: Home Health | Attending: Internal Medicine | Admitting: Internal Medicine

## 2024-07-13 ENCOUNTER — Other Ambulatory Visit: Payer: Self-pay

## 2024-07-13 DIAGNOSIS — K7460 Unspecified cirrhosis of liver: Secondary | ICD-10-CM | POA: Diagnosis present

## 2024-07-13 DIAGNOSIS — I428 Other cardiomyopathies: Secondary | ICD-10-CM | POA: Diagnosis present

## 2024-07-13 DIAGNOSIS — Z803 Family history of malignant neoplasm of breast: Secondary | ICD-10-CM

## 2024-07-13 DIAGNOSIS — H00013 Hordeolum externum right eye, unspecified eyelid: Secondary | ICD-10-CM | POA: Diagnosis present

## 2024-07-13 DIAGNOSIS — E785 Hyperlipidemia, unspecified: Secondary | ICD-10-CM | POA: Diagnosis present

## 2024-07-13 DIAGNOSIS — Z6835 Body mass index (BMI) 35.0-35.9, adult: Secondary | ICD-10-CM | POA: Diagnosis not present

## 2024-07-13 DIAGNOSIS — Z8249 Family history of ischemic heart disease and other diseases of the circulatory system: Secondary | ICD-10-CM

## 2024-07-13 DIAGNOSIS — I5043 Acute on chronic combined systolic (congestive) and diastolic (congestive) heart failure: Secondary | ICD-10-CM | POA: Diagnosis present

## 2024-07-13 DIAGNOSIS — Z96611 Presence of right artificial shoulder joint: Secondary | ICD-10-CM | POA: Diagnosis present

## 2024-07-13 DIAGNOSIS — Z880 Allergy status to penicillin: Secondary | ICD-10-CM

## 2024-07-13 DIAGNOSIS — Z87891 Personal history of nicotine dependence: Secondary | ICD-10-CM

## 2024-07-13 DIAGNOSIS — Z7984 Long term (current) use of oral hypoglycemic drugs: Secondary | ICD-10-CM

## 2024-07-13 DIAGNOSIS — I272 Pulmonary hypertension, unspecified: Secondary | ICD-10-CM | POA: Diagnosis present

## 2024-07-13 DIAGNOSIS — Z950 Presence of cardiac pacemaker: Secondary | ICD-10-CM | POA: Diagnosis not present

## 2024-07-13 DIAGNOSIS — M797 Fibromyalgia: Secondary | ICD-10-CM | POA: Diagnosis present

## 2024-07-13 DIAGNOSIS — R0602 Shortness of breath: Principal | ICD-10-CM | POA: Diagnosis present

## 2024-07-13 DIAGNOSIS — Z7982 Long term (current) use of aspirin: Secondary | ICD-10-CM

## 2024-07-13 DIAGNOSIS — K219 Gastro-esophageal reflux disease without esophagitis: Secondary | ICD-10-CM | POA: Diagnosis present

## 2024-07-13 DIAGNOSIS — Z886 Allergy status to analgesic agent status: Secondary | ICD-10-CM

## 2024-07-13 DIAGNOSIS — Z882 Allergy status to sulfonamides status: Secondary | ICD-10-CM | POA: Diagnosis not present

## 2024-07-13 DIAGNOSIS — I3139 Other pericardial effusion (noninflammatory): Secondary | ICD-10-CM | POA: Diagnosis present

## 2024-07-13 DIAGNOSIS — Z7983 Long term (current) use of bisphosphonates: Secondary | ICD-10-CM | POA: Diagnosis not present

## 2024-07-13 DIAGNOSIS — Z881 Allergy status to other antibiotic agents status: Secondary | ICD-10-CM

## 2024-07-13 DIAGNOSIS — E1151 Type 2 diabetes mellitus with diabetic peripheral angiopathy without gangrene: Secondary | ICD-10-CM | POA: Diagnosis present

## 2024-07-13 DIAGNOSIS — E669 Obesity, unspecified: Secondary | ICD-10-CM | POA: Diagnosis present

## 2024-07-13 DIAGNOSIS — K8020 Calculus of gallbladder without cholecystitis without obstruction: Secondary | ICD-10-CM | POA: Diagnosis present

## 2024-07-13 DIAGNOSIS — J449 Chronic obstructive pulmonary disease, unspecified: Secondary | ICD-10-CM | POA: Diagnosis present

## 2024-07-13 DIAGNOSIS — Z79899 Other long term (current) drug therapy: Secondary | ICD-10-CM

## 2024-07-13 DIAGNOSIS — E039 Hypothyroidism, unspecified: Secondary | ICD-10-CM | POA: Diagnosis present

## 2024-07-13 DIAGNOSIS — K7581 Nonalcoholic steatohepatitis (NASH): Secondary | ICD-10-CM | POA: Diagnosis present

## 2024-07-13 DIAGNOSIS — Z1152 Encounter for screening for COVID-19: Secondary | ICD-10-CM | POA: Diagnosis not present

## 2024-07-13 DIAGNOSIS — Z888 Allergy status to other drugs, medicaments and biological substances status: Secondary | ICD-10-CM

## 2024-07-13 DIAGNOSIS — J4489 Other specified chronic obstructive pulmonary disease: Secondary | ICD-10-CM | POA: Diagnosis present

## 2024-07-13 DIAGNOSIS — R600 Localized edema: Secondary | ICD-10-CM | POA: Diagnosis present

## 2024-07-13 DIAGNOSIS — I5023 Acute on chronic systolic (congestive) heart failure: Secondary | ICD-10-CM | POA: Diagnosis not present

## 2024-07-13 DIAGNOSIS — Z885 Allergy status to narcotic agent status: Secondary | ICD-10-CM

## 2024-07-13 DIAGNOSIS — Z9011 Acquired absence of right breast and nipple: Secondary | ICD-10-CM

## 2024-07-13 DIAGNOSIS — I10 Essential (primary) hypertension: Secondary | ICD-10-CM | POA: Diagnosis present

## 2024-07-13 DIAGNOSIS — Z820 Family history of epilepsy and other diseases of the nervous system: Secondary | ICD-10-CM

## 2024-07-13 DIAGNOSIS — Z91048 Other nonmedicinal substance allergy status: Secondary | ICD-10-CM

## 2024-07-13 DIAGNOSIS — I110 Hypertensive heart disease with heart failure: Secondary | ICD-10-CM | POA: Diagnosis present

## 2024-07-13 DIAGNOSIS — Z853 Personal history of malignant neoplasm of breast: Secondary | ICD-10-CM

## 2024-07-13 DIAGNOSIS — E739 Lactose intolerance, unspecified: Secondary | ICD-10-CM | POA: Diagnosis present

## 2024-07-13 DIAGNOSIS — R109 Unspecified abdominal pain: Secondary | ICD-10-CM

## 2024-07-13 DIAGNOSIS — Z808 Family history of malignant neoplasm of other organs or systems: Secondary | ICD-10-CM

## 2024-07-13 DIAGNOSIS — R079 Chest pain, unspecified: Secondary | ICD-10-CM

## 2024-07-13 DIAGNOSIS — Z883 Allergy status to other anti-infective agents status: Secondary | ICD-10-CM

## 2024-07-13 LAB — URINALYSIS, W/ REFLEX TO CULTURE (INFECTION SUSPECTED)
Bilirubin Urine: NEGATIVE
Glucose, UA: NEGATIVE mg/dL
Hgb urine dipstick: NEGATIVE
Ketones, ur: NEGATIVE mg/dL
Nitrite: NEGATIVE
Protein, ur: 100 mg/dL — AB
Specific Gravity, Urine: 1.02 (ref 1.005–1.030)
pH: 5 (ref 5.0–8.0)

## 2024-07-13 LAB — COMPREHENSIVE METABOLIC PANEL WITH GFR
ALT: 37 U/L (ref 0–44)
AST: 61 U/L — ABNORMAL HIGH (ref 15–41)
Albumin: 3.3 g/dL — ABNORMAL LOW (ref 3.5–5.0)
Alkaline Phosphatase: 144 U/L — ABNORMAL HIGH (ref 38–126)
Anion gap: 10 (ref 5–15)
BUN: 20 mg/dL (ref 8–23)
CO2: 23 mmol/L (ref 22–32)
Calcium: 9.7 mg/dL (ref 8.9–10.3)
Chloride: 102 mmol/L (ref 98–111)
Creatinine, Ser: 1.06 mg/dL — ABNORMAL HIGH (ref 0.44–1.00)
GFR, Estimated: 55 mL/min — ABNORMAL LOW (ref >60)
Glucose, Bld: 111 mg/dL — ABNORMAL HIGH (ref 70–99)
Potassium: 4.9 mmol/L (ref 3.5–5.1)
Sodium: 135 mmol/L (ref 135–145)
Total Bilirubin: 0.9 mg/dL (ref 0.0–1.2)
Total Protein: 7 g/dL (ref 6.5–8.1)

## 2024-07-13 LAB — I-STAT CHEM 8, ED
BUN: 21 mg/dL (ref 8–23)
Calcium, Ion: 1.25 mmol/L (ref 1.15–1.40)
Chloride: 104 mmol/L (ref 98–111)
Creatinine, Ser: 1.1 mg/dL — ABNORMAL HIGH (ref 0.44–1.00)
Glucose, Bld: 111 mg/dL — ABNORMAL HIGH (ref 70–99)
HCT: 37 % (ref 36.0–46.0)
Hemoglobin: 12.6 g/dL (ref 12.0–15.0)
Potassium: 4.7 mmol/L (ref 3.5–5.1)
Sodium: 138 mmol/L (ref 135–145)
TCO2: 24 mmol/L (ref 22–32)

## 2024-07-13 LAB — RESP PANEL BY RT-PCR (RSV, FLU A&B, COVID)  RVPGX2
Influenza A by PCR: NEGATIVE
Influenza B by PCR: NEGATIVE
Resp Syncytial Virus by PCR: NEGATIVE
SARS Coronavirus 2 by RT PCR: NEGATIVE

## 2024-07-13 LAB — CBC WITH DIFFERENTIAL/PLATELET
Abs Immature Granulocytes: 0.02 K/uL (ref 0.00–0.07)
Basophils Absolute: 0 K/uL (ref 0.0–0.1)
Basophils Relative: 0 %
Eosinophils Absolute: 0.1 K/uL (ref 0.0–0.5)
Eosinophils Relative: 1 %
HCT: 37.8 % (ref 36.0–46.0)
Hemoglobin: 12 g/dL (ref 12.0–15.0)
Immature Granulocytes: 0 %
Lymphocytes Relative: 15 %
Lymphs Abs: 1.1 K/uL (ref 0.7–4.0)
MCH: 32.1 pg (ref 26.0–34.0)
MCHC: 31.7 g/dL (ref 30.0–36.0)
MCV: 101.1 fL — ABNORMAL HIGH (ref 80.0–100.0)
Monocytes Absolute: 0.5 K/uL (ref 0.1–1.0)
Monocytes Relative: 7 %
Neutro Abs: 5.8 K/uL (ref 1.7–7.7)
Neutrophils Relative %: 77 %
Platelets: 235 K/uL (ref 150–400)
RBC: 3.74 MIL/uL — ABNORMAL LOW (ref 3.87–5.11)
RDW: 14.6 % (ref 11.5–15.5)
WBC: 7.6 K/uL (ref 4.0–10.5)
nRBC: 0 % (ref 0.0–0.2)

## 2024-07-13 LAB — TSH: TSH: 3.447 u[IU]/mL (ref 0.350–4.500)

## 2024-07-13 LAB — LIPASE, BLOOD: Lipase: 28 U/L (ref 11–51)

## 2024-07-13 LAB — TROPONIN I (HIGH SENSITIVITY)
Troponin I (High Sensitivity): 21 ng/L — ABNORMAL HIGH (ref <18)
Troponin I (High Sensitivity): 20 ng/L — ABNORMAL HIGH (ref <18)

## 2024-07-13 LAB — BRAIN NATRIURETIC PEPTIDE: B Natriuretic Peptide: 1523.2 pg/mL — ABNORMAL HIGH (ref 0.0–100.0)

## 2024-07-13 LAB — I-STAT CG4 LACTIC ACID, ED: Lactic Acid, Venous: 1.2 mmol/L (ref 0.5–1.9)

## 2024-07-13 LAB — MAGNESIUM: Magnesium: 2.4 mg/dL (ref 1.7–2.4)

## 2024-07-13 MED ORDER — ONDANSETRON HCL 4 MG/2ML IJ SOLN
4.0000 mg | Freq: Four times a day (QID) | INTRAMUSCULAR | Status: DC | PRN
  Administered 2024-07-13 – 2024-07-15 (×2): 4 mg via INTRAVENOUS
  Filled 2024-07-13 (×2): qty 2

## 2024-07-13 MED ORDER — ACETAMINOPHEN 325 MG PO TABS
650.0000 mg | ORAL_TABLET | ORAL | Status: DC | PRN
  Administered 2024-07-13 – 2024-07-15 (×4): 650 mg via ORAL
  Filled 2024-07-13 (×4): qty 2

## 2024-07-13 MED ORDER — ONDANSETRON HCL 4 MG PO TABS: 4.0000 mg | ORAL_TABLET | Freq: Four times a day (QID) | ORAL | Status: DC | PRN

## 2024-07-13 MED ORDER — ASPIRIN 81 MG PO CHEW
81.0000 mg | CHEWABLE_TABLET | Freq: Every day | ORAL | Status: DC
  Administered 2024-07-14 – 2024-07-15 (×2): 81 mg via ORAL
  Filled 2024-07-13 (×2): qty 1

## 2024-07-13 MED ORDER — FUROSEMIDE 10 MG/ML IJ SOLN
40.0000 mg | Freq: Once | INTRAMUSCULAR | Status: AC
  Administered 2024-07-13: 40 mg via INTRAVENOUS
  Filled 2024-07-13: qty 4

## 2024-07-13 MED ORDER — BUMETANIDE 0.25 MG/ML IJ SOLN
2.0000 mg | Freq: Every day | INTRAMUSCULAR | Status: DC
  Administered 2024-07-14 – 2024-07-15 (×2): 2 mg via INTRAVENOUS
  Filled 2024-07-13 (×2): qty 8

## 2024-07-13 MED ORDER — CARVEDILOL 3.125 MG PO TABS
3.1250 mg | ORAL_TABLET | Freq: Two times a day (BID) | ORAL | Status: DC
  Filled 2024-07-13 (×3): qty 1

## 2024-07-13 MED ORDER — LEVOTHYROXINE SODIUM 112 MCG PO TABS
112.0000 ug | ORAL_TABLET | Freq: Every day | ORAL | Status: DC
  Administered 2024-07-14 – 2024-07-15 (×2): 112 ug via ORAL
  Filled 2024-07-13 (×2): qty 1

## 2024-07-13 MED ORDER — FLUTICASONE FUROATE-VILANTEROL 200-25 MCG/ACT IN AEPB
1.0000 | INHALATION_SPRAY | Freq: Every day | RESPIRATORY_TRACT | Status: DC
Start: 2024-07-14 — End: 2024-07-15
  Administered 2024-07-14: 1 via RESPIRATORY_TRACT
  Filled 2024-07-13: qty 28

## 2024-07-13 MED ORDER — ACETAMINOPHEN 325 MG PO TABS
650.0000 mg | ORAL_TABLET | Freq: Four times a day (QID) | ORAL | Status: DC | PRN
Start: 2024-07-13 — End: 2024-07-13
  Administered 2024-07-13: 650 mg via ORAL
  Filled 2024-07-13: qty 2

## 2024-07-13 MED ORDER — UMECLIDINIUM BROMIDE 62.5 MCG/ACT IN AEPB
1.0000 | INHALATION_SPRAY | Freq: Every day | RESPIRATORY_TRACT | Status: DC
  Administered 2024-07-14: 1 via RESPIRATORY_TRACT
  Filled 2024-07-13: qty 7

## 2024-07-13 MED ORDER — HYDROMORPHONE HCL 1 MG/ML IJ SOLN: 0.5000 mg | INTRAMUSCULAR | Status: DC | PRN

## 2024-07-13 MED ORDER — AMIODARONE HCL 200 MG PO TABS
200.0000 mg | ORAL_TABLET | Freq: Every day | ORAL | Status: DC
  Administered 2024-07-14 – 2024-07-15 (×2): 200 mg via ORAL
  Filled 2024-07-13 (×2): qty 1

## 2024-07-13 MED ORDER — IOHEXOL 350 MG/ML SOLN
75.0000 mL | Freq: Once | INTRAVENOUS | Status: AC | PRN
Start: 2024-07-13 — End: 2024-07-13
  Administered 2024-07-13: 75 mL via INTRAVENOUS

## 2024-07-13 MED ORDER — SODIUM CHLORIDE 0.9% FLUSH
3.0000 mL | Freq: Two times a day (BID) | INTRAVENOUS | Status: DC
  Administered 2024-07-13 – 2024-07-15 (×4): 3 mL via INTRAVENOUS

## 2024-07-13 MED ORDER — QUINIDINE SULFATE 200 MG PO TABS
200.0000 mg | ORAL_TABLET | Freq: Three times a day (TID) | ORAL | Status: DC
  Administered 2024-07-13 – 2024-07-15 (×5): 200 mg via ORAL
  Filled 2024-07-13 (×9): qty 1

## 2024-07-13 MED ORDER — ISOSORBIDE MONONITRATE ER 30 MG PO TB24
30.0000 mg | ORAL_TABLET | Freq: Every day | ORAL | Status: DC
  Administered 2024-07-14 – 2024-07-15 (×2): 30 mg via ORAL
  Filled 2024-07-13 (×2): qty 1

## 2024-07-13 MED ORDER — ENOXAPARIN SODIUM 40 MG/0.4ML IJ SOSY
40.0000 mg | PREFILLED_SYRINGE | INTRAMUSCULAR | Status: DC
  Administered 2024-07-13 – 2024-07-14 (×2): 40 mg via SUBCUTANEOUS
  Filled 2024-07-13 (×2): qty 0.4

## 2024-07-13 MED ORDER — ACETAMINOPHEN 650 MG RE SUPP: 650.0000 mg | RECTAL | Status: DC | PRN

## 2024-07-13 NOTE — Hospital Course (Addendum)
 Tanya Hogan is a 73 yo female with PMH HFrEF (EF 20-25%), HTN, pulmonary HTN, HLD, OSA, NASH, GERD, IBS-C, hypothyroidism, DM II, depression, fibromyalgia, breast cancer s/p mastectomy, Ventricular tachyarrhythmia s/p CRT-D (05/31/24). She is a poor historian but overall able to state that she presents mostly due to shortness of breath.  With further questioning, she states that she has felt short of breath due to abdominal distention and difficulty with taking a big breath due to this.  She has also noticed worsening lower extremity swelling recently.  She endorsed recently finishing a course of antibiotics for diverticulitis but when asked when course was completed, she was unable to state. With chart review the CT showing diverticulitis was 05/07/2024 and care everywhere and she was discharged on 05/10/2024 with ciprofloxacin  and Flagyl  to finish 4 more days at discharge.  Unclear if she has really been having diarrhea for that long or if more recent.  She does endorse actual abdominal pain also with questioning which appears to be more localized to the RUQ.  Multiple recent hospitalizations briefly summarized below also:  1) Most recent was 10/30/ - 10/31 at [facility] medical center for vision changes described as floaters.  She had CT head which was negative and CT angio head/neck also negative.  MRI brain unable to be performed due to pacemaker incompatibility.  Her symptoms improved and she was discharged home. She followed up with neurology on 07/07/2024 and was recommended to have repeat CT head at that time to pick up on any possible stroke that may have been present.  She also has upcoming appointment with ophthalmology on 08/11/2024.  Repeat CT head has not yet been performed since neurology visit on 11/14.  2) 9/19 - 10/1 for palpitations and VT  3) 10/13 until 10/15 due to ventricular tachycardia.  She was evaluated by cardiology and EP.  She underwent dosage increase of quinidine.  In  the ER, initial concern was for potential CHF exacerbation due to her SOB and LE edema. BNP elevated higher than prior values, 1,523.  CTA chest negative for PE. Small pericardial effusion, mild interstitial edema.  Has missed some doses of spironolactone  at home recently but compliant on torsemide  she says. Has not taken any extra doses.  Believes she has gained about 8 pounds recently.   With more workup, due to RUQ pain, underwent CT A/P which showed moderate gallbladder wall thickening with cholelithiasis concerning for potential acute cholecystitis.  Diverticulosis noted with no further inflammation. General surgery was consulted for further assistance with the gallbladder workup.  She was afebrile with no leukocytosis. No antibiotics were initiated.   Assessment & Plan:  SOB LE edema Acute on chronic combined systolic and diastolic CHF - potentially mild CHF exacerbation given weight gain, LE edema, SOB (but may be confounded by abd pain limiting breathing and body habitus) - Mild interstitial edema on CTA chest, small pericardial effusion - BNP 1,523 higher than prior values - last echo 05/13/24: EF 20-25%, Gr 2 DD  - Endorsed missing a few doses recently of spironolactone  but has been compliant on torsemide .  Approximately 8 pound weight gain - allergy noted to lasix ; trial given in ER, patient did not like nor want anymore - has tolerated IV bumex  in past, will try that next for diuresis and likely involve cardiology if HIDA positive, otherwise, outpt follow up should be okay  - continue bumex  2 mg daily -Diuresed well and transitioned back to home regimen at discharge  Abdominal pain-resolved -  appears to be more localized to RUQ - CT A/P negative for diverticulitis; shows GB wall thickening with cholelithiasis - General Surgery consulted on admission and recommending HIDA scan and holding off on antibiotics for now - Nontoxic-appearing and afebrile with no leukocytosis -  HIDA scan also negative - Patient tolerated diet advancement and stable for discharge home  Diarrhea -resolved - unclear duration and patient very poor historian  - last abx cipro /flagyl  completed ~ 9/21 - check cdiff and GI panel; patient had no further diarrhea after admission and therefore no stool testing able to be performed  Hx monomorphic VT - followed by EP - recent upgrade in October 2025 of [employer] CRT-P to CRT-D and addition of RV lead  - Continue amiodarone  and quinidine (latter recently increased also) - Continue Coreg   NASH Elevated LFTs - resume statin at discharge; LFTs improved  - pattern not consistent with obstruction   HTN - continue coreg   DMII - 5.5% on 06/05/24 - continue diet control    COPD No signs of exacerbation  - continue breathing treatments as needed   Obesity - Complicates overall prognosis and care - Body mass index is 35.82 kg/m.   Hypothyroidism Continue with levothyroxine 

## 2024-07-13 NOTE — H&P (Signed)
 History and Physical    ARYAH DOERING  FMW:[id number]  DOB: 1951/03/05  DOA: 07/13/2024  PCP: [name], MD Patient coming from: Home  Chief Complaint: SOB, LE swelling   HPI:  Ms. Kimmet is a 73 yo female with PMH HFrEF (EF 20-25%), HTN, pulmonary HTN, HLD, OSA, NASH, GERD, IBS-C, hypothyroidism, DM II, depression, fibromyalgia, breast cancer s/p mastectomy, Ventricular tachyarrhythmia s/p CRT-D (05/31/24). She is a poor historian but overall able to state that she presents mostly due to shortness of breath.  With further questioning, she states that she has felt short of breath due to abdominal distention and difficulty with taking a big breath due to this.  She has also noticed worsening lower extremity swelling recently.  She endorsed recently finishing a course of antibiotics for diverticulitis but when asked when course was completed, she was unable to state. With chart review the CT showing diverticulitis was 05/07/2024 and care everywhere and she was discharged on 05/10/2024 with ciprofloxacin  and Flagyl  to finish 4 more days at discharge.  Unclear if she has really been having diarrhea for that long or if more recent.  She does endorse actual abdominal pain also with questioning which appears to be more localized to the RUQ.  Multiple recent hospitalizations briefly summarized below also:  1) Most recent was 10/30/ - 10/31 at [facility] medical center for vision changes described as floaters.  She had CT head which was negative and CT angio head/neck also negative.  MRI brain unable to be performed due to pacemaker incompatibility.  Her symptoms improved and she was discharged home. She followed up with neurology on 07/07/2024 and was recommended to have repeat CT head at that time to pick up on any possible stroke that may have been present.  She also has upcoming appointment with ophthalmology on 08/11/2024.  Repeat CT head has not yet been performed since neurology  visit on 11/14.  2) 10/13 until 10/15 due to ventricular tachycardia.  She was evaluated by cardiology and EP.  She underwent dosage increase of quinidine.  3) 9/19 - 10/1 for palpitations and VT   In the ER, initial concern was for potential CHF exacerbation due to her SOB and LE edema. BNP elevated higher than prior values, 1,523.  CTA chest negative for PE. Small pericardial effusion, mild interstitial edema.  Has missed some doses of spironolactone  at home recently but compliant on torsemide  she says. Has not taken any extra doses.  Believes she has gained about 8 pounds recently.   With more workup, due to RUQ pain, underwent CT A/P which showed moderate gallbladder wall thickening with cholelithiasis concerning for potential acute cholecystitis.  Diverticulosis noted with no further inflammation. General surgery was consulted for further assistance with the gallbladder workup.  She was afebrile with no leukocytosis. No antibiotics were initiated.   Assessment & Plan:  SOB LE edema Acute on chronic combined systolic and diastolic CHF - potentially mild CHF exacerbation given weight gain, LE edema, SOB (but may be confounded by abd pain limiting breathing and body habitus) - Mild interstitial edema on CTA chest, small pericardial effusion - BNP 1,523 higher than prior values - last echo 05/13/24: EF 20-25%, Gr 2 DD  - Endorsed missing a few doses recently of spironolactone  but has been compliant on torsemide .  Approximately 8 pound weight gain - allergy noted to lasix ; trial given in ER, patient did not like nor want anymore - has tolerated IV bumex  in past, will try that  next for diuresis and likely involve cardiology in am, especially as cardiac clearance needed if HIDA positive - start bumex  2 mg daily  Abdominal pain - appears to be more localized to RUQ - CT A/P negative for diverticulitis; shows GB wall thickening with cholelithiasis - General Surgery consulted on  admission and recommending HIDA scan and holding off on antibiotics for now - Nontoxic-appearing and afebrile with no leukocytosis - CLD and NPO at [facility] for HIDA  Diarrhea - unclear duration and patient very poor historian  - last abx cipro /flagyl  completed ~ 9/21 - check cdiff and GI panel   Hx monomorphic VT - followed by EP - recent upgrade in October 2025 of [employer] CRT-P to CRT-D and addition of RV lead  - Continue amiodarone  and quinidine (latter recently increased also) - Continue Coreg   NASH Elevated LFTs - hold statin for now with acutely elevated LFTs - pattern not consistent with obstruction  HTN - continue coreg   DMII - 5.5% on 06/05/24 - continue diet control    COPD No signs of exacerbation  - continue breathing treatments as needed   Obesity - Complicates overall prognosis and care - follow up BMI   Hypothyroidism Continue with levothyroxine   I have personally briefly reviewed patient's old medical records in [facility] and discussed patient with the ER provider when appropriate/indicated.   Code Status:     Code Status: Full Code  DVT Prophylaxis: Lovenox      Anticipated disposition is to: Home  History: Past Medical History:  Diagnosis Date   Alkaline phosphatase elevation    Asthma    Breast cancer (HCC) 2011   Carpal tunnel syndrome    bilateral   Chronic HFrEF (heart failure with reduced ejection fraction) (HCC)    Cirrhosis, non-alcoholic (HCC)    Closed fracture of unspecified part of radius (alone)    DDD (degenerative disc disease), lumbar    Depression    pt denies   Diabetes mellitus without complication (HCC)    Enlarged heart    Fibromyalgia    GERD (gastroesophageal reflux disease)    History of kidney stones    HTN (hypertension)    Hypothyroidism    IBS (irritable bowel syndrome)    LBBB (left bundle branch block)    Microcytic anemia 05/14/2017   Mitral regurgitation    NICM (nonischemic cardiomyopathy) (HCC)     Pneumonia    Presence of permanent cardiac pacemaker    Pulmonary hypertension (HCC)    PVD (peripheral vascular disease)    2019   Sleep apnea    uses cpap   Tricuspid regurgitation    Ventricular tachyarrhythmia [facility])     Past Surgical History:  Procedure Laterality Date   BIV ICD GENERATOR CHANGEOUT N/A 05/31/2024   Procedure: BIV ICD GENERATOR CHANGEOUT;  Surgeon: Almetta Donnice LABOR, MD;  Location: [facility] INVASIVE CV LAB;  Service: Cardiovascular;  Laterality: N/A;   BIV PACEMAKER GENERATOR CHANGEOUT N/A 11/03/2021   Procedure: BIV PACEMAKER GENERATOR CHANGEOUT;  Surgeon: Waddell Danelle ORN, MD;  Location: [facility] INVASIVE CV LAB;  Service: Cardiovascular;  Laterality: N/A;   BREAST LUMPECTOMY Left 2011   COLONOSCOPY     FOOT SURGERY     KNEE SURGERY     LEAD INSERTION N/A 05/31/2024   Procedure: LEAD INSERTION;  Surgeon: Almetta Donnice LABOR, MD;  Location: [facility] INVASIVE CV LAB;  Service: Cardiovascular;  Laterality: N/A;   LEAD REVISION/REPAIR N/A 11/03/2021   Procedure: LEAD REVISION/REPAIR;  Surgeon: Waddell Danelle ORN,  MD;  Location: [facility] INVASIVE CV LAB;  Service: Cardiovascular;  Laterality: N/A;   MASTECTOMY Right    05/2019   PACEMAKER GENERATOR CHANGE N/A 08/23/2014   Procedure: PACEMAKER GENERATOR CHANGE;  Surgeon: Danelle LELON Birmingham, MD;  Location: [facility] CATH LAB;  Service: Cardiovascular;  Laterality: N/A;   PACEMAKER PLACEMENT  2009   [place] [facility]   RECTAL SURGERY  1976   REDUCTION MAMMAPLASTY Left    05/2019   RIGHT/LEFT HEART CATH AND CORONARY ANGIOGRAPHY N/A 05/18/2024   Procedure: RIGHT/LEFT HEART CATH AND CORONARY ANGIOGRAPHY;  Surgeon: Wendel Lurena POUR, MD;  Location: [facility] INVASIVE CV LAB;  Service: Cardiovascular;  Laterality: N/A;   TOTAL SHOULDER ARTHROPLASTY Right 03/10/2018   TOTAL SHOULDER ARTHROPLASTY Right 03/10/2018   Procedure: RIGHT TOTAL SHOULDER ARTHROPLASTY;  Surgeon: Dozier Soulier, MD;  Location: [facility] OR;  Service: Orthopedics;  Laterality: Right;   TUBAL  LIGATION     UPPER EXTREMITY VENOGRAPHY  05/18/2024   Procedure: UPPER EXTREMITY VENOGRAPHY;  Surgeon: Wendel Lurena POUR, MD;  Location: [facility] INVASIVE CV LAB;  Service: Cardiovascular;;     reports that she quit smoking about 57 years ago. Her smoking use included cigarettes. She has never used smokeless tobacco. She reports that she does not drink alcohol  and does not use drugs.  Allergies  Allergen Reactions   Injectafer [Ferric Carboxymaltose] Shortness Of Breath    Mild SOB following injectafer infusion    Penicillins Anaphylaxis and Rash   Accupril [Quinapril Hcl] Other (See Comments)    Hyperkalemia=high potassium level   Celebrex [Celecoxib] Other (See Comments)    Hx hematemesis, bleeding ulcer   Cozaar  [Losartan  Potassium] Itching   Dml Forte Rash   Entresto [Sacubitril-Valsartan] Other (See Comments)    Hyperkalemia   Flonase  [Fluticasone ] Other (See Comments)    Hx nasal ulcer   Lactose Intolerance (Gi) Diarrhea   Lipitor [Atorvastatin ] Other (See Comments)    Myalgias    Nasonex  [Mometasone  Furoate] Other (See Comments)    Epistaxis    Nsaids Other (See Comments)    Hx hematemesis, bleeding ulcer   Advil [Ibuprofen] Nausea And Vomiting    Hx hematemesis, bleeding ulcer   Baking Soda-Fluoride [Sodium Fluoride] Itching and Rash   Bayer Aspirin  [Aspirin ] Other (See Comments)    Hx hematemesis, bleeding ulcer   Chamomile Hives   Clindamycin /Lincomycin Rash   Codeine Nausea And Vomiting   Lasix  [Furosemide ] Other (See Comments)    Tinnitus  Hypotension   Miralax  [Polyethylene Glycol (Macrogol)] Other (See Comments)    Leaky gut syndrome   Quinine Rash   Reclast  [Zoledronic  Acid] Rash and Other (See Comments)    Tremors    Rifadin [Rifampin] Other (See Comments)    Unknown reaction   Singulair  [Montelukast ] Other (See Comments)    Dizziness    Sulfa Antibiotics Rash   Ultram  [Tramadol ] Anxiety and Rash   Vesicare  [Solifenacin ] Other (See Comments)    Urinary  retention   Wellbutrin  [Bupropion ] Other (See Comments)    Myalgias  Skin crawling sensation   Wool Alcohol  [Lanolin] Rash    Family History  Problem Relation Age of Onset   Heart disease Mother    Melanoma Mother    Parkinson's disease Mother    Heart disease Father    Heart disease Brother    Heart failure Other    Breast cancer Other    Hypertension Other     Home Medications: Prior to Admission medications   Medication Sig Start Date End Date Taking?  Authorizing Provider  albuterol  (PROVENTIL ) (2.5 MG/3ML) 0.083% nebulizer solution Take 2.5 mg by nebulization every 6 (six) hours as needed for wheezing or shortness of breath.   Yes [provider]  albuterol  (VENTOLIN  HFA) 108 (90 Base) MCG/ACT inhaler Inhale 2 puffs into the lungs every 6 (six) hours as needed for wheezing or shortness of breath.   Yes [provider]  alendronate  (FOSAMAX ) 70 MG tablet TAKE 1 TABLET BY MOUTH EVERY 7  DAYS WITH FULL GLASS OF WATER  ON AN EMPTY STOMACH Patient taking differently: Saturdays 05/26/24  Yes [name], MD  amiodarone  (PACERONE ) 200 MG tablet Take 1 tablet (200 mg total) by mouth daily. 07/03/24  Yes [name], MD  aspirin  81 MG chewable tablet Chew 81 mg by mouth daily.   Yes [provider]  Calcium  Carbonate-Vit D-Min (CALCIUM  600+D PLUS MINERALS) 600-400 MG-UNIT TABS 1 tab p.o. twice daily 11/02/23  Yes Curtis Debby PARAS, MD  Continuous Glucose Receiver (FREESTYLE LIBRE 3 READER) DEVI Use for continuous glucose monitoring 04/10/24  Yes [name], MD  Continuous Glucose Sensor (FREESTYLE LIBRE 3 SENSOR) MISC by Does not apply route. Change every 14 days   Yes [provider]  EPINEPHrine  0.3 mg/0.3 mL IJ SOAJ injection Inject 0.3 mg into the muscle as needed for anaphylaxis. 08/27/23  Yes [name], MD  estradiol (ESTRACE) 0.1 MG/GM vaginal cream Place 1 Applicatorful vaginally 3 (three) times a week.    Yes [provider]  fexofenadine  (ALLEGRA ) 180 MG tablet Take 180 mg by mouth daily.   Yes [provider]  fluticasone -salmeterol (WIXELA INHUB ) 250-50 MCG/ACT AEPB Inhale 1 puff into the lungs in the morning and at bedtime.   Yes [provider]  glucose blood test strip To be used to check blood glucose Dx:E11.8 FreeStyle Precision blood glucose for Freestyle Libre 2 05/02/24  Yes [name], MD  isosorbide  mononitrate (IMDUR ) 30 MG 24 hr tablet TAKE 1 TABLET BY MOUTH DAILY 05/26/24  Yes Pietro Redell RAMAN, MD  levothyroxine  (SYNTHROID ) 112 MCG tablet TAKE 1 TABLET BY MOUTH DAILY 07/05/24  Yes [name], MD  lubiprostone (AMITIZA) 24 MCG capsule Take 24 mcg by mouth 2 (two) times daily with a meal. Patient reports provider has decreased to every three days.   Yes [provider]  metoprolol succinate (TOPROL-XL) 50 MG 24 hr tablet Take 1 tablet (50 mg total) by mouth 2 (two) times daily. Take with or immediately following a meal. 06/27/24  Yes Monge, Damien BROCKS, NP  Multiple Vitamins-Minerals (MULTIVITAMIN WOMEN 50+) TABS Take 1 tablet by mouth daily.   Yes [provider]  pantoprazole  (PROTONIX ) 40 MG tablet TAKE 1 TABLET BY MOUTH DAILY  BEFORE BREAKFAST Patient taking differently: Take 40 mg by mouth 2 (two) times daily. 10/20/22  Yes [name], MD  pravastatin  (PRAVACHOL ) 40 MG tablet Take 40 mg by mouth daily. Patient taking differently: Take 80 mg by mouth in the morning and at bedtime. 01/26/24  Yes [provider]  quiNIDine sulfate 200 MG tablet Take 1 tablet (200 mg total) by mouth 3 (three) times daily. 06/07/24  Yes Ollis, Brandi L, NP  SPIRIVA  RESPIMAT 2.5 MCG/ACT AERS Inhale 2 puffs into the lungs daily. 06/21/24  Yes [provider]  spironolactone  (ALDACTONE ) 25 MG tablet Take 0.5 tablets (12.5 mg total) by mouth daily. 06/08/24 09/06/24 Yes Pietro Redell RAMAN, MD  torsemide  (DEMADEX ) 20 MG tablet  TAKE 1 TABLET BY MOUTH  DAILY 05/26/24  Yes Pietro Redell RAMAN, MD  valACYclovir  (VALTREX ) 1000 MG tablet Take 1 tablet (1,000 mg total) by mouth 2 (two) times daily. Patient taking differently: Take 1,000 mg by mouth as needed. 05/17/23  Yes Alvia Bring, DO  carvedilol  (COREG ) 3.125 MG tablet Take 3.125 mg by mouth 2 (two) times daily. Patient not taking: Reported on 07/13/2024 06/21/24   [provider]  GEMTESA 75 MG TABS Take 75 mg by mouth daily. Patient not taking: Reported on 07/03/2024 01/11/24   [provider]  metFORMIN  (GLUCOPHAGE -XR) 500 MG 24 hr tablet TAKE 1 TABLET BY MOUTH TWICE  DAILY WITH MEALS Patient not taking: Reported on 07/13/2024 04/19/24   [name], MD  Tiotropium Bromide  Monohydrate (SPIRIVA  RESPIMAT) 1.25 MCG/ACT AERS Inhale 1 puff into the lungs daily.    [provider]    Review of Systems:  Review of Systems  Constitutional:  Negative for chills and fever.  HENT: Negative.    Eyes: Negative.   Respiratory:  Positive for shortness of breath.   Cardiovascular:  Positive for leg swelling.  Gastrointestinal:  Positive for abdominal pain and diarrhea.  Genitourinary: Negative.   Musculoskeletal: Negative.   Skin: Negative.   Neurological: Negative.   Endo/Heme/Allergies: Negative.   Psychiatric/Behavioral: Negative.      Physical Exam:  Vitals:   07/13/24 1600 07/13/24 1728 07/13/24 1906 07/13/24 1910  BP: 110/74   118/79  Pulse: 92  90 90  Resp: (!) 33  (!) 24 (!) 26  Temp:  97.6 F (36.4 C) (!) 97.5 F (36.4 C)   TempSrc:  Oral Oral   SpO2: 94%  97% 100%   Physical Exam Constitutional:      Appearance: Normal appearance.  HENT:     Head: Normocephalic and atraumatic.     Mouth/Throat:     Mouth: Mucous membranes are moist.  Eyes:     Extraocular Movements: Extraocular movements intact.  Cardiovascular:     Rate and Rhythm: Normal rate and regular rhythm.  Pulmonary:     Effort: Pulmonary effort is  normal. No respiratory distress.     Breath sounds: Normal breath sounds.  Abdominal:     General: Bowel sounds are normal.     Palpations: Abdomen is soft.     Tenderness: There is abdominal tenderness in the right upper quadrant.  Musculoskeletal:        General: Normal range of motion.     Cervical back: Normal range of motion and neck supple.     Right lower leg: Edema (1-2+) present.     Left lower leg: Edema (1-2+) present.  Neurological:     General: No focal deficit present.     Mental Status: She is alert.  Psychiatric:        Mood and Affect: Mood normal.      Labs on Admission:  I have personally reviewed following labs and imaging studies Results for orders placed or performed during the hospital encounter of 07/13/24 (from the past 24 hours)  Resp panel by RT-PCR (RSV, Flu A&B, Covid) Anterior Nasal Swab     Status: None   Collection Time: 07/13/24  2:04 PM   Specimen: Anterior Nasal Swab  Result Value Ref Range   SARS Coronavirus 2 by RT PCR NEGATIVE NEGATIVE   Influenza A by PCR NEGATIVE NEGATIVE   Influenza B by PCR NEGATIVE NEGATIVE   Resp Syncytial Virus by PCR NEGATIVE NEGATIVE  Brain natriuretic peptide  Status: Abnormal   Collection Time: 07/13/24  2:30 PM  Result Value Ref Range   B Natriuretic Peptide 1,523.2 (H) 0.0 - 100.0 pg/mL  CBC with Differential     Status: Abnormal   Collection Time: 07/13/24  2:30 PM  Result Value Ref Range   WBC 7.6 4.0 - 10.5 K/uL   RBC 3.74 (L) 3.87 - 5.11 MIL/uL   Hemoglobin 12.0 12.0 - 15.0 g/dL   HCT 62.1 63.9 - 53.9 %   MCV 101.1 (H) 80.0 - 100.0 fL   MCH 32.1 26.0 - 34.0 pg   MCHC 31.7 30.0 - 36.0 g/dL   RDW 85.3 88.4 - 84.4 %   Platelets 235 150 - 400 K/uL   nRBC 0.0 0.0 - 0.2 %   Neutrophils Relative % 77 %   Neutro Abs 5.8 1.7 - 7.7 K/uL   Lymphocytes Relative 15 %   Lymphs Abs 1.1 0.7 - 4.0 K/uL   Monocytes Relative 7 %   Monocytes Absolute 0.5 0.1 - 1.0 K/uL   Eosinophils Relative 1 %   Eosinophils  Absolute 0.1 0.0 - 0.5 K/uL   Basophils Relative 0 %   Basophils Absolute 0.0 0.0 - 0.1 K/uL   Immature Granulocytes 0 %   Abs Immature Granulocytes 0.02 0.00 - 0.07 K/uL  Comprehensive metabolic panel     Status: Abnormal   Collection Time: 07/13/24  2:30 PM  Result Value Ref Range   Sodium 135 135 - 145 mmol/L   Potassium 4.9 3.5 - 5.1 mmol/L   Chloride 102 98 - 111 mmol/L   CO2 23 22 - 32 mmol/L   Glucose, Bld 111 (H) 70 - 99 mg/dL   BUN 20 8 - 23 mg/dL   Creatinine, Ser 8.93 (H) 0.44 - 1.00 mg/dL   Calcium  9.7 8.9 - 10.3 mg/dL   Total Protein 7.0 6.5 - 8.1 g/dL   Albumin 3.3 (L) 3.5 - 5.0 g/dL   AST 61 (H) 15 - 41 U/L   ALT 37 0 - 44 U/L   Alkaline Phosphatase 144 (H) 38 - 126 U/L   Total Bilirubin 0.9 0.0 - 1.2 mg/dL   GFR, Estimated 55 (L) >60 mL/min   Anion gap 10 5 - 15  Lipase, blood     Status: None   Collection Time: 07/13/24  2:30 PM  Result Value Ref Range   Lipase 28 11 - 51 U/L  TSH     Status: None   Collection Time: 07/13/24  2:30 PM  Result Value Ref Range   TSH 3.447 0.350 - 4.500 uIU/mL  Magnesium      Status: None   Collection Time: 07/13/24  2:30 PM  Result Value Ref Range   Magnesium  2.4 1.7 - 2.4 mg/dL  Troponin I (High Sensitivity)     Status: Abnormal   Collection Time: 07/13/24  2:30 PM  Result Value Ref Range   Troponin I (High Sensitivity) 21 (H) <18 ng/L  Urinalysis, w/ Reflex to Culture (Infection Suspected) -Urine, Clean Catch     Status: Abnormal   Collection Time: 07/13/24  2:35 PM  Result Value Ref Range   Specimen Source URINE, CLEAN CATCH    Color, Urine YELLOW YELLOW   APPearance CLOUDY (A) CLEAR   Specific Gravity, Urine 1.020 1.005 - 1.030   pH 5.0 5.0 - 8.0   Glucose, UA NEGATIVE NEGATIVE mg/dL   Hgb urine dipstick NEGATIVE NEGATIVE   Bilirubin Urine NEGATIVE NEGATIVE   Ketones, ur NEGATIVE NEGATIVE mg/dL  Protein, ur 100 (A) NEGATIVE mg/dL   Nitrite NEGATIVE NEGATIVE   Leukocytes,Ua MODERATE (A) NEGATIVE   RBC / HPF 0-5  0 - 5 RBC/hpf   WBC, UA 21-50 0 - 5 WBC/hpf   Bacteria, UA RARE (A) NONE SEEN   Squamous Epithelial / HPF 0-5 0 - 5 /HPF   WBC Clumps PRESENT    Mucus PRESENT    Hyaline Casts, UA PRESENT    Ca Oxalate Crys, UA PRESENT   I-Stat CG4 Lactic Acid     Status: None   Collection Time: 07/13/24  2:44 PM  Result Value Ref Range   Lactic Acid, Venous 1.2 0.5 - 1.9 mmol/L  I-stat chem 8, ED (not at [facility], [facility] or [facility])     Status: Abnormal   Collection Time: 07/13/24  2:44 PM  Result Value Ref Range   Sodium 138 135 - 145 mmol/L   Potassium 4.7 3.5 - 5.1 mmol/L   Chloride 104 98 - 111 mmol/L   BUN 21 8 - 23 mg/dL   Creatinine, Ser 8.89 (H) 0.44 - 1.00 mg/dL   Glucose, Bld 888 (H) 70 - 99 mg/dL   Calcium , Ion 1.25 1.15 - 1.40 mmol/L   TCO2 24 22 - 32 mmol/L   Hemoglobin 12.6 12.0 - 15.0 g/dL   HCT 62.9 63.9 - 53.9 %  Troponin I (High Sensitivity)     Status: Abnormal   Collection Time: 07/13/24  5:20 PM  Result Value Ref Range   Troponin I (High Sensitivity) 20 (H) <18 ng/L   *Note: Due to a large number of results and/or encounters for the requested time period, some results have not been displayed. A complete set of results can be found in Results Review.     Radiological Exams on Admission: CT Angio Chest PE W and/or Wo Contrast Result Date: 07/13/2024 CLINICAL DATA:  High probability for PE.  CHF exacerbation. EXAM: CT ANGIOGRAPHY CHEST WITH CONTRAST TECHNIQUE: Multidetector CT imaging of the chest was performed using the standard protocol during bolus administration of intravenous contrast. Multiplanar CT image reconstructions and MIPs were obtained to evaluate the vascular anatomy. RADIATION DOSE REDUCTION: This exam was performed according to the departmental dose-optimization program which includes automated exposure control, adjustment of the mA and/or kV according to patient size and/or use of iterative reconstruction technique. CONTRAST:  75mL OMNIPAQUE  IOHEXOL  350 MG/ML SOLN  COMPARISON:  CT of the chest 04/23/2023. FINDINGS: Cardiovascular: Aorta is normal in size. Heart is enlarged. Left-sided pacemaker is present. There is a small pericardial effusion. There is adequate opacification of the pulmonary arteries to the segmental level. No pulmonary embolism identified. Mediastinum/Nodes: There are nonenlarged mediastinal lymph nodes diffusely, increased in size and number compared to prior study. Mildly enlarged right hilar lymph nodes have increased from prior measuring up to 10 mm. Lungs/Pleura: There is no pleural effusion or pneumothorax. There some smooth interlobular septal thickening in the lung bases with mild ground-glass opacities. There is a band of atelectasis in the left lower lobe. Upper Abdomen: No acute abnormality. Musculoskeletal: Right mastectomy changes are present. There are calcifications in the left breast. Right shoulder arthroplasty is present. Review of the MIP images confirms the above findings. IMPRESSION: 1. No evidence for pulmonary embolism. 2. Cardiomegaly with small pericardial effusion. 3. Mild interstitial edema. 4. Mild mediastinal and right hilar lymphadenopathy, increased from prior. This may be reactive. Electronically Signed   By: Greig Pique M.D.   On: 07/13/2024 15:43   CT ABDOMEN PELVIS W  CONTRAST Result Date: 07/13/2024 EXAM: CT ABDOMEN AND PELVIS WITH CONTRAST 07/13/2024 03:19:47 PM TECHNIQUE: CT of the abdomen and pelvis was performed with the administration of 75 mL of iohexol  (OMNIPAQUE ) 350 MG/ML injection. Multiplanar reformatted images are provided for review. Automated exposure control, iterative reconstruction, and/or weight-based adjustment of the mA/kV was utilized to reduce the radiation dose to as low as reasonably achievable. COMPARISON: 11/29/2023 CLINICAL HISTORY: Abdominal pain, acute, nonlocalized; Upper abdominal pain and chest pain, pleuritic symptoms. Shortness of breath. Cannot lay flat. Concern for heart failure or  other intra-abdominal pathology versus PE versus fluid overload/pneumonia. FINDINGS: LOWER CHEST: Mild septal thickening seen in visualized lung bases concerning for edema. LIVER: The liver is unremarkable. GALLBLADDER AND BILE DUCTS: Moderate gallbladder wall thickening is noted with cholelithiasis concerning for possible cholecystitis. Ultrasound is recommended for further evaluation. No biliary ductal dilatation. SPLEEN: No acute abnormality. PANCREAS: No acute abnormality. ADRENAL GLANDS: No acute abnormality. KIDNEYS, URETERS AND BLADDER: No stones in the kidneys or ureters. No hydronephrosis. No perinephric or periureteral stranding. Urinary bladder is unremarkable. GI AND BOWEL: Stomach demonstrates no acute abnormality. There is no bowel obstruction. Diverticulosis of descending and sigmoid colon is noted without inflammation. PERITONEUM AND RETROPERITONEUM: No ascites. No free air. VASCULATURE: Aorta is normal in caliber. LYMPH NODES: No lymphadenopathy. REPRODUCTIVE ORGANS: Status post hysterectomy. BONES AND SOFT TISSUES: No acute osseous abnormality. No focal soft tissue abnormality. IMPRESSION: 1. Moderate gallbladder wall thickening with cholelithiasis, suspicious for acute cholecystitis; ultrasound is recommended for further evaluation. 2. Mild septal thickening in the visualized lung bases, suspicious for edema. 3. Diverticulosis of the descending and sigmoid colon without inflammation. Electronically signed by: Lynwood Seip MD 07/13/2024 03:36 PM EST RP Workstation: [id number]   CT Angio Chest PE W and/or Wo Contrast  Final Result    CT ABDOMEN PELVIS W CONTRAST  Final Result    NM Hepatobiliary Liver Func    (Results Pending)    Consults called:  General surgery   EKG: Independently reviewed. sinus   Alm Apo, MD [facility] 07/13/2024, 7:35 PM

## 2024-07-13 NOTE — Consult Note (Signed)
 Consult Note  Tanya Hogan 23-Dec-1950  [id number].    Requesting MD:  Dr. Benton Shone, MD  Chief Complaint/Reason for Consult:  CT concerning for possible cholecystitis and gallbladder wall thickening/distention.   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 Patient reported some diarrhea but no constipation. She alternates between this and recently was treated for diverticulitis a few weeks ago. She reports upper abdominal pain. Reports she has been eating mostly applesauce. Denies nausea or vomiting. Does not specifically report what sounds like biliary colic. Reports 40 lbs unintentional weight loss over there last year. Most of her symptoms are sort of vague and non-specific. She is wanting something to drink currently and tylenol  for pain. Prior abdominal surgery includes tubal ligation. Not on blood thinners currently other than 81 mg ASA daily. 29 allergies listed in chart. ECHO 05/13/24 with EF 20-25%.    ROS: Per HPI  Family History  Problem Relation Age of Onset   Heart disease Mother    Melanoma Mother    Parkinson's disease Mother    Heart disease Father    Heart disease Brother    Heart failure Other    Breast cancer Other    Hypertension Other     Past Medical History:  Diagnosis Date   Alkaline phosphatase elevation    Asthma    Breast cancer (HCC) 2011   Carpal tunnel syndrome    bilateral   Chronic HFrEF (heart failure with reduced ejection fraction) (HCC)    Cirrhosis,  non-alcoholic (HCC)    Closed fracture of unspecified part of radius (alone)    DDD (degenerative disc disease), lumbar    Depression    pt denies   Diabetes mellitus without complication (HCC)    Enlarged heart    Fibromyalgia    GERD (gastroesophageal reflux disease)    History of kidney stones    HTN (hypertension)    Hypothyroidism    IBS (irritable bowel syndrome)    LBBB (left bundle branch block)    Microcytic anemia 05/14/2017   Mitral regurgitation    NICM (nonischemic cardiomyopathy) (HCC)    Pneumonia    Presence of permanent cardiac pacemaker    Pulmonary hypertension (HCC)    PVD (peripheral vascular disease)    2019   Sleep apnea    uses cpap   Tricuspid regurgitation    Ventricular tachyarrhythmia [facility])     Past Surgical History:  Procedure Laterality Date   BIV ICD GENERATOR CHANGEOUT N/A 05/31/2024   Procedure: BIV ICD GENERATOR CHANGEOUT;  Surgeon: Almetta Donnice LABOR, MD;  Location: [facility] INVASIVE CV LAB;  Service: Cardiovascular;  Laterality: N/A;   BIV PACEMAKER GENERATOR CHANGEOUT N/A 11/03/2021   Procedure: BIV PACEMAKER GENERATOR CHANGEOUT;  Surgeon: Waddell Danelle ORN, MD;  Location: [facility] INVASIVE CV LAB;  Service: Cardiovascular;  Laterality: N/A;   BREAST LUMPECTOMY Left 2011   COLONOSCOPY     FOOT SURGERY     KNEE SURGERY     LEAD INSERTION N/A 05/31/2024   Procedure: LEAD INSERTION;  Surgeon: Almetta Donnice LABOR, MD;  Location: [facility] INVASIVE CV LAB;  Service: Cardiovascular;  Laterality: N/A;   LEAD REVISION/REPAIR N/A 11/03/2021   Procedure: LEAD REVISION/REPAIR;  Surgeon: Waddell Danelle ORN, MD;  Location: [facility] INVASIVE CV LAB;  Service: Cardiovascular;  Laterality: N/A;   MASTECTOMY Right    05/2019   PACEMAKER GENERATOR CHANGE N/A 08/23/2014   Procedure: PACEMAKER GENERATOR CHANGE;  Surgeon: Danelle ORN Waddell, MD;  Location: [facility] CATH LAB;  Service: Cardiovascular;  Laterality: N/A;   PACEMAKER PLACEMENT  2009   [place] [facility]   RECTAL SURGERY  1976    REDUCTION MAMMAPLASTY Left    05/2019   RIGHT/LEFT HEART CATH AND CORONARY ANGIOGRAPHY N/A 05/18/2024   Procedure: RIGHT/LEFT HEART CATH AND CORONARY ANGIOGRAPHY;  Surgeon: Wendel Lurena POUR, MD;  Location: [facility] INVASIVE CV LAB;  Service: Cardiovascular;  Laterality: N/A;   TOTAL SHOULDER ARTHROPLASTY Right 03/10/2018   TOTAL SHOULDER ARTHROPLASTY Right 03/10/2018   Procedure: RIGHT TOTAL SHOULDER ARTHROPLASTY;  Surgeon: Dozier Soulier, MD;  Location: [facility] OR;  Service: Orthopedics;  Laterality: Right;   TUBAL LIGATION     UPPER EXTREMITY VENOGRAPHY  05/18/2024   Procedure: UPPER EXTREMITY VENOGRAPHY;  Surgeon: Wendel Lurena POUR, MD;  Location: [facility] INVASIVE CV LAB;  Service: Cardiovascular;;    Social History:  reports that she quit smoking about 57 years ago. Her smoking use included cigarettes. She has never used smokeless tobacco. She reports that she does not drink alcohol and does not use drugs.  Allergies:  Allergies  Allergen Reactions   Injectafer [Ferric Carboxymaltose] Shortness Of Breath    Mild SOB following injectafer infusion    Penicillins Anaphylaxis and Rash   Accupril [Quinapril Hcl] Other (See Comments)    Hyperkalemia=high potassium level   Celebrex [Celecoxib] Other (See Comments)    Hx hematemesis, bleeding ulcer   Cozaar  [Losartan  Potassium] Itching   Dml Forte Rash   Entresto [Sacubitril-Valsartan] Other (See Comments)    Hyperkalemia   Flonase  [Fluticasone ] Other (See Comments)    Hx nasal ulcer   Lactose Intolerance (Gi) Diarrhea   Lipitor [Atorvastatin ] Other (See Comments)    Myalgias    Nasonex  [Mometasone  Furoate] Other (See Comments)    Epistaxis    Nsaids Other (See Comments)    Hx hematemesis, bleeding ulcer   Advil [Ibuprofen] Nausea And Vomiting    Hx hematemesis, bleeding ulcer   Baking Soda-Fluoride [Sodium Fluoride] Itching and Rash   Bayer Aspirin  [Aspirin ] Other (See Comments)    Hx hematemesis, bleeding ulcer   Chamomile Hives    Clindamycin /Lincomycin Rash   Codeine Nausea And Vomiting   Lasix  [Furosemide ] Other (See Comments)    Tinnitus  Hypotension   Miralax  [Polyethylene Glycol (Macrogol)] Other (See Comments)    Leaky gut syndrome   Quinine Rash   Reclast  [Zoledronic  Acid] Rash and Other (See Comments)    Tremors    Rifadin [Rifampin] Other (See Comments)    Unknown reaction   Singulair  [Montelukast ] Other (See Comments)    Dizziness    Sulfa Antibiotics Rash   Ultram  [Tramadol ] Anxiety and Rash   Vesicare  [Solifenacin ] Other (See Comments)    Urinary retention   Wellbutrin  [Bupropion ] Other (See Comments)    Myalgias  Skin crawling sensation   Wool Alcohol [Lanolin] Rash    (Not in a hospital admission)   Blood pressure 114/74, pulse 88, temperature (!) 97.4 F (36.3 C), temperature source Temporal, resp. rate (!) 26, SpO2 94%. Physical Exam:  General: pleasant, WD, chronically ill appearing female who is laying in bed and appears SOB HEENT: head  is normocephalic, atraumatic.  Sclera are anicteric.  Ears and nose without any masses or lesions.  Mouth is pink and moist Heart: regular, rate, and rhythm. Palpable radial and pedal pulses bilaterally Lungs: tachypnea  Abd: soft, morbidly obese, TTP in RUQ but also in epigastrium and LUQ, ND MS: all 4 extremities are symmetrical. Skin: warm and dry with no masses, lesions, or rashes Neuro: Cranial nerves 2-12 grossly intact, sensation is normal throughout Psych: A&Ox3 with an appropriate affect.   Results for orders placed or performed during the hospital encounter of 07/13/24 (from the past 48 hours)  Resp panel by RT-PCR (RSV, Flu A&B, Covid) Anterior Nasal Swab     Status: None   Collection Time: 07/13/24  2:04 PM   Specimen: Anterior Nasal Swab  Result Value Ref Range   SARS Coronavirus 2 by RT PCR NEGATIVE NEGATIVE   Influenza A by PCR NEGATIVE NEGATIVE   Influenza B by PCR NEGATIVE NEGATIVE    Comment: (NOTE) The Xpert Xpress  SARS-CoV-2/FLU/RSV plus assay is intended as an aid in the diagnosis of influenza from Nasopharyngeal swab specimens and should not be used as a sole basis for treatment. Nasal washings and aspirates are unacceptable for Xpert Xpress SARS-CoV-2/FLU/RSV testing.  Fact Sheet for Patients: bloggercourse.com  Fact Sheet for Healthcare Providers: seriousbroker.it  This test is not yet approved or cleared by the [place]  FDA and has been authorized for detection and/or diagnosis of SARS-CoV-2 by FDA under an Emergency Use Authorization (EUA). This EUA will remain in effect (meaning this test can be used) for the duration of the COVID-19 declaration under Section 564(b)(1) of the Act, 21 U.S.C. section 360bbb-3(b)(1), unless the authorization is terminated or revoked.     Resp Syncytial Virus by PCR NEGATIVE NEGATIVE    Comment: (NOTE) Fact Sheet for Patients: bloggercourse.com  Fact Sheet for Healthcare Providers: seriousbroker.it  This test is not yet approved or cleared by the [place]  FDA and has been authorized for detection and/or diagnosis of SARS-CoV-2 by FDA under an Emergency Use Authorization (EUA). This EUA will remain in effect (meaning this test can be used) for the duration of the COVID-19 declaration under Section 564(b)(1) of the Act, 21 U.S.C. section 360bbb-3(b)(1), unless the authorization is terminated or revoked.  Performed at [facility], [address] [place], [place] [address]   Brain natriuretic peptide     Status: Abnormal   Collection Time: 07/13/24  2:30 PM  Result Value Ref Range   B Natriuretic Peptide 1,523.2 (H) 0.0 - 100.0 pg/mL    Comment: Performed at [facility], [address] [place], [place] [address]  CBC with Differential     Status: Abnormal   Collection Time: 07/13/24  2:30 PM  Result Value Ref Range   WBC 7.6  4.0 - 10.5 K/uL   RBC 3.74 (L) 3.87 - 5.11 MIL/uL   Hemoglobin 12.0 12.0 - 15.0 g/dL   HCT 62.1 63.9 - 53.9 %   MCV 101.1 (H) 80.0 - 100.0 fL   MCH 32.1 26.0 - 34.0 pg   MCHC 31.7 30.0 - 36.0 g/dL   RDW 85.3 88.4 - 84.4 %   Platelets 235 150 - 400 K/uL   nRBC 0.0 0.0 - 0.2 %   Neutrophils Relative % 77 %   Neutro Abs 5.8 1.7 - 7.7 K/uL   Lymphocytes Relative 15 %   Lymphs Abs 1.1 0.7 - 4.0 K/uL   Monocytes Relative 7 %  Monocytes Absolute 0.5 0.1 - 1.0 K/uL   Eosinophils Relative 1 %   Eosinophils Absolute 0.1 0.0 - 0.5 K/uL   Basophils Relative 0 %   Basophils Absolute 0.0 0.0 - 0.1 K/uL   Immature Granulocytes 0 %   Abs Immature Granulocytes 0.02 0.00 - 0.07 K/uL    Comment: Performed at [facility], [address], [place], [place] [address]  Comprehensive metabolic panel     Status: Abnormal   Collection Time: 07/13/24  2:30 PM  Result Value Ref Range   Sodium 135 135 - 145 mmol/L   Potassium 4.9 3.5 - 5.1 mmol/L    Comment: HEMOLYSIS AT THIS LEVEL MAY AFFECT RESULT   Chloride 102 98 - 111 mmol/L   CO2 23 22 - 32 mmol/L   Glucose, Bld 111 (H) 70 - 99 mg/dL    Comment: Glucose reference range applies only to samples taken after fasting for at least 8 hours.   BUN 20 8 - 23 mg/dL   Creatinine, Ser 8.93 (H) 0.44 - 1.00 mg/dL   Calcium  9.7 8.9 - 10.3 mg/dL   Total Protein 7.0 6.5 - 8.1 g/dL   Albumin 3.3 (L) 3.5 - 5.0 g/dL   AST 61 (H) 15 - 41 U/L    Comment: HEMOLYSIS AT THIS LEVEL MAY AFFECT RESULT   ALT 37 0 - 44 U/L    Comment: HEMOLYSIS AT THIS LEVEL MAY AFFECT RESULT   Alkaline Phosphatase 144 (H) 38 - 126 U/L   Total Bilirubin 0.9 0.0 - 1.2 mg/dL    Comment: HEMOLYSIS AT THIS LEVEL MAY AFFECT RESULT   GFR, Estimated 55 (L) >60 mL/min    Comment: (NOTE) Calculated using the CKD-EPI Creatinine Equation (2021)    Anion gap 10 5 - 15    Comment: Performed at [facility], [address] [place], [place] [address]  Lipase, blood     Status: None    Collection Time: 07/13/24  2:30 PM  Result Value Ref Range   Lipase 28 11 - 51 U/L    Comment: Performed at [facility], [address], [place], [place] [address]  TSH     Status: None   Collection Time: 07/13/24  2:30 PM  Result Value Ref Range   TSH 3.447 0.350 - 4.500 uIU/mL    Comment: Performed by a 3rd Generation assay with a functional sensitivity of <=0.01 uIU/mL. Performed at [facility], [address] [place], [place] [address]   Magnesium      Status: None   Collection Time: 07/13/24  2:30 PM  Result Value Ref Range   Magnesium  2.4 1.7 - 2.4 mg/dL    Comment: Performed at [facility], [address] [place], [place] [address]  Troponin I (High Sensitivity)     Status: Abnormal   Collection Time: 07/13/24  2:30 PM  Result Value Ref Range   Troponin I (High Sensitivity) 21 (H) <18 ng/L    Comment: (NOTE) Elevated high sensitivity troponin I (hsTnI) values and significant  changes across serial measurements may suggest ACS but many other  chronic and acute conditions are known to elevate hsTnI results.  Refer to the Links section for chest pain algorithms and additional  guidance. Performed at [facility], [address] [place], [place] [address]   Urinalysis, w/ Reflex to Culture (Infection Suspected) -Urine, Clean Catch     Status: Abnormal   Collection Time: 07/13/24  2:35  PM  Result Value Ref Range   Specimen Source URINE, CLEAN CATCH    Color, Urine YELLOW YELLOW   APPearance CLOUDY (A) CLEAR   Specific Gravity, Urine 1.020 1.005 - 1.030   pH 5.0 5.0 - 8.0   Glucose, UA NEGATIVE NEGATIVE mg/dL   Hgb urine dipstick NEGATIVE NEGATIVE   Bilirubin Urine NEGATIVE NEGATIVE   Ketones, ur NEGATIVE NEGATIVE mg/dL   Protein, ur 899 (A) NEGATIVE mg/dL   Nitrite NEGATIVE NEGATIVE   Leukocytes,Ua MODERATE (A) NEGATIVE   RBC / HPF 0-5 0 - 5 RBC/hpf   WBC, UA 21-50 0 - 5 WBC/hpf    Comment:        Reflex urine culture not performed if  WBC <=10, OR if Squamous epithelial cells >5. If Squamous epithelial cells >5 suggest recollection.    Bacteria, UA RARE (A) NONE SEEN   Squamous Epithelial / HPF 0-5 0 - 5 /HPF   WBC Clumps PRESENT    Mucus PRESENT    Hyaline Casts, UA PRESENT    Ca Oxalate Crys, UA PRESENT     Comment: Performed at [facility], [address], [place], [place] [address]  I-Stat CG4 Lactic Acid     Status: None   Collection Time: 07/13/24  2:44 PM  Result Value Ref Range   Lactic Acid, Venous 1.2 0.5 - 1.9 mmol/L  I-stat chem 8, ED (not at [facility], [facility] or [facility])     Status: Abnormal   Collection Time: 07/13/24  2:44 PM  Result Value Ref Range   Sodium 138 135 - 145 mmol/L   Potassium 4.7 3.5 - 5.1 mmol/L   Chloride 104 98 - 111 mmol/L   BUN 21 8 - 23 mg/dL   Creatinine, Ser 8.89 (H) 0.44 - 1.00 mg/dL   Glucose, Bld 888 (H) 70 - 99 mg/dL    Comment: Glucose reference range applies only to samples taken after fasting for at least 8 hours.   Calcium , Ion 1.25 1.15 - 1.40 mmol/L   TCO2 24 22 - 32 mmol/L   Hemoglobin 12.6 12.0 - 15.0 g/dL   HCT 62.9 63.9 - 53.9 %   *Note: Due to a large number of results and/or encounters for the requested time period, some results have not been displayed. A complete set of results can be found in Results Review.   CT Angio Chest PE W and/or Wo Contrast Result Date: 07/13/2024 CLINICAL DATA:  High probability for PE.  CHF exacerbation. EXAM: CT ANGIOGRAPHY CHEST WITH CONTRAST TECHNIQUE: Multidetector CT imaging of the chest was performed using the standard protocol during bolus administration of intravenous contrast. Multiplanar CT image reconstructions and MIPs were obtained to evaluate the vascular anatomy. RADIATION DOSE REDUCTION: This exam was performed according to the departmental dose-optimization program which includes automated exposure control, adjustment of the mA and/or kV according to patient size and/or use of iterative reconstruction technique.  CONTRAST:  75mL OMNIPAQUE  IOHEXOL  350 MG/ML SOLN COMPARISON:  CT of the chest 04/23/2023. FINDINGS: Cardiovascular: Aorta is normal in size. Heart is enlarged. Left-sided pacemaker is present. There is a small pericardial effusion. There is adequate opacification of the pulmonary arteries to the segmental level. No pulmonary embolism identified. Mediastinum/Nodes: There are nonenlarged mediastinal lymph nodes diffusely, increased in size and number compared to prior study. Mildly enlarged right hilar lymph nodes have increased from prior measuring up to 10 mm. Lungs/Pleura: There is no pleural effusion or pneumothorax. There some smooth interlobular septal thickening in the  lung bases with mild ground-glass opacities. There is a band of atelectasis in the left lower lobe. Upper Abdomen: No acute abnormality. Musculoskeletal: Right mastectomy changes are present. There are calcifications in the left breast. Right shoulder arthroplasty is present. Review of the MIP images confirms the above findings. IMPRESSION: 1. No evidence for pulmonary embolism. 2. Cardiomegaly with small pericardial effusion. 3. Mild interstitial edema. 4. Mild mediastinal and right hilar lymphadenopathy, increased from prior. This may be reactive. Electronically Signed   By: Greig Pique M.D.   On: 07/13/2024 15:43   CT ABDOMEN PELVIS W CONTRAST Result Date: 07/13/2024 EXAM: CT ABDOMEN AND PELVIS WITH CONTRAST 07/13/2024 03:19:47 PM TECHNIQUE: CT of the abdomen and pelvis was performed with the administration of 75 mL of iohexol  (OMNIPAQUE ) 350 MG/ML injection. Multiplanar reformatted images are provided for review. Automated exposure control, iterative reconstruction, and/or weight-based adjustment of the mA/kV was utilized to reduce the radiation dose to as low as reasonably achievable. COMPARISON: 11/29/2023 CLINICAL HISTORY: Abdominal pain, acute, nonlocalized; Upper abdominal pain and chest pain, pleuritic symptoms. Shortness of  breath. Cannot lay flat. Concern for heart failure or other intra-abdominal pathology versus PE versus fluid overload/pneumonia. FINDINGS: LOWER CHEST: Mild septal thickening seen in visualized lung bases concerning for edema. LIVER: The liver is unremarkable. GALLBLADDER AND BILE DUCTS: Moderate gallbladder wall thickening is noted with cholelithiasis concerning for possible cholecystitis. Ultrasound is recommended for further evaluation. No biliary ductal dilatation. SPLEEN: No acute abnormality. PANCREAS: No acute abnormality. ADRENAL GLANDS: No acute abnormality. KIDNEYS, URETERS AND BLADDER: No stones in the kidneys or ureters. No hydronephrosis. No perinephric or periureteral stranding. Urinary bladder is unremarkable. GI AND BOWEL: Stomach demonstrates no acute abnormality. There is no bowel obstruction. Diverticulosis of descending and sigmoid colon is noted without inflammation. PERITONEUM AND RETROPERITONEUM: No ascites. No free air. VASCULATURE: Aorta is normal in caliber. LYMPH NODES: No lymphadenopathy. REPRODUCTIVE ORGANS: Status post hysterectomy. BONES AND SOFT TISSUES: No acute osseous abnormality. No focal soft tissue abnormality. IMPRESSION: 1. Moderate gallbladder wall thickening with cholelithiasis, suspicious for acute cholecystitis; ultrasound is recommended for further evaluation. 2. Mild septal thickening in the visualized lung bases, suspicious for edema. 3. Diverticulosis of the descending and sigmoid colon without inflammation. Electronically signed by: Lynwood Seip MD 07/13/2024 03:36 PM EST RP Workstation: [id number]      Assessment/Plan Cholelithiasis with possible acute cholecystitis  - CT with cholelithiasis and moderate gallbladder wall thickening - this is somewhat non-specific in patient with acute CHF exacerbation  - no leukocytosis, afebrile, HD stable  - Alk Phos and AST mildly elevated  - at this time would recommend HIDA to better rule in or out acute cholecystitis   - if positive patient will need cardiology to see for perioperative risk stratification, if high risk will need to discuss whether cholecystostomy tube may be appropriate and if she would be a candidate for interval laparoscopic cholecystectomy  - if HIDA is negative then nothing to do acutely for cholelithiasis in setting of acute CHF exacerbation  - pt ate around noon so ok to have CLD and will make NPO after MN for HIDA in AM   Admit to internal medicine and surgery will follow up results of HIDA scan   FEN: CLD, NPO after MN VTE: per admitting ID: per admitting   Per admitting:  previous breast cancer, CHF with pacemaker and defibrillator (replaced 3 weeks ago) with acute exacerbation  previous ventricular tachycardia previous SVT COPD Hypothyroidism kidney stones mitral regurgitation tricuspid regurgitation pulmonary hypertension  Hypertension Diabetes Hyperlipidemia 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  This care required high  level of medical decision making.   Burnard JONELLE Louder, [facility] 07/13/2024, 4:38 PM Please see Amion for pager number during day hours 7:00am-4:30pm

## 2024-07-13 NOTE — ED Provider Notes (Addendum)
 [place] EMERGENCY DEPARTMENT AT [facility] Provider Note   CSN: [id number] Arrival date & time: 07/13/24  1331     Patient presents with: Congestive Heart Failure   Tanya Hogan is a 73 y.o. female.   The history is provided by the patient and medical records. No language interpreter was used.  Congestive Heart Failure This is a recurrent problem. The current episode started more than 2 days ago. The problem occurs constantly. The problem has been rapidly worsening. Associated symptoms include chest pain, abdominal pain and shortness of breath. Pertinent negatives include no headaches. The symptoms are aggravated by exertion (layin flat). Nothing relieves the symptoms. She has tried nothing for the symptoms. The treatment provided no relief.       Prior to Admission medications   Medication Sig Start Date End Date Taking? Authorizing Provider  albuterol  (PROVENTIL ) (2.5 MG/3ML) 0.083% nebulizer solution Take 2.5 mg by nebulization every 6 (six) hours as needed for wheezing or shortness of breath.    [provider]  albuterol  (VENTOLIN  HFA) 108 (90 Base) MCG/ACT inhaler Inhale 2 puffs into the lungs every 6 (six) hours as needed for wheezing or shortness of breath.    [provider]  alendronate  (FOSAMAX ) 70 MG tablet TAKE 1 TABLET BY MOUTH EVERY 7  DAYS WITH FULL GLASS OF WATER  ON AN EMPTY STOMACH 05/26/24   [name], MD  amiodarone  (PACERONE ) 200 MG tablet Take 1 tablet (200 mg total) by mouth daily. 07/03/24   [name], MD  aspirin  81 MG chewable tablet Chew 81 mg by mouth daily.    [provider]  Calcium  Carbonate-Vit D-Min (CALCIUM  600+D PLUS MINERALS) 600-400 MG-UNIT TABS 1 tab p.o. twice daily 11/02/23   Curtis Debby PARAS, MD  Continuous Glucose Receiver (FREESTYLE LIBRE 3 READER) DEVI Use for continuous glucose monitoring 04/10/24   [name], MD  Continuous Glucose Sensor (FREESTYLE LIBRE 3  SENSOR) MISC by Does not apply route. Change every 14 days    [provider]  EPINEPHrine  0.3 mg/0.3 mL IJ SOAJ injection Inject 0.3 mg into the muscle as needed for anaphylaxis. 08/27/23   [name], MD  estradiol (ESTRACE) 0.1 MG/GM vaginal cream Place 1 Applicatorful vaginally 3 (three) times a week.    [provider]  fexofenadine  (ALLEGRA ) 180 MG tablet Take 180 mg by mouth daily.    [provider]  fluticasone -salmeterol (WIXELA INHUB ) 250-50 MCG/ACT AEPB Inhale 1 puff into the lungs in the morning and at bedtime.    [provider]  GEMTESA 75 MG TABS Take 75 mg by mouth daily. Patient not taking: Reported on 07/03/2024 01/11/24   [provider]  glucose blood test strip To be used to check blood glucose Dx:E11.8 FreeStyle Precision blood glucose for Freestyle Libre 2 05/02/24   [name], MD  isosorbide  mononitrate (IMDUR ) 30 MG 24 hr tablet TAKE 1 TABLET BY MOUTH DAILY 05/26/24   Pietro Redell GORMAN, MD  levothyroxine  (SYNTHROID ) 112 MCG tablet TAKE 1 TABLET BY MOUTH DAILY 07/05/24   [name], MD  lubiprostone (AMITIZA) 24 MCG capsule Take 24 mcg by mouth 2 (two) times daily with a meal. Patient reports provider has decreased to every three days.    [provider]  metFORMIN  (GLUCOPHAGE -XR) 500 MG 24 hr tablet TAKE 1 TABLET BY MOUTH TWICE  DAILY WITH MEALS 04/19/24   [name], MD  metoprolol succinate (TOPROL-XL) 50 MG 24 hr tablet  Take 1 tablet (50 mg total) by mouth 2 (two) times daily. Take with or immediately following a meal. 06/27/24   Monge, Damien BROCKS, NP  Multiple Vitamins-Minerals (MULTIVITAMIN WOMEN 50+) TABS Take 1 tablet by mouth daily.    [provider]  pantoprazole  (PROTONIX ) 40 MG tablet TAKE 1 TABLET BY MOUTH DAILY  BEFORE BREAKFAST Patient taking differently: Take 40 mg by mouth 2 (two) times daily. 10/20/22   [name], MD  pravastatin  (PRAVACHOL ) 40 MG  tablet Take 40 mg by mouth daily. Patient taking differently: Take 40 mg by mouth in the morning and at bedtime. 01/26/24   [provider]  quiNIDine sulfate 200 MG tablet Take 1 tablet (200 mg total) by mouth 3 (three) times daily. 06/07/24   Aniceto Daphne CROME, NP  spironolactone  (ALDACTONE ) 25 MG tablet Take 0.5 tablets (12.5 mg total) by mouth daily. 06/08/24 09/06/24  Pietro Redell RAMAN, MD  Tiotropium Bromide  Monohydrate (SPIRIVA  RESPIMAT) 1.25 MCG/ACT AERS Inhale 1 puff into the lungs daily.    [provider]  torsemide  (DEMADEX ) 20 MG tablet TAKE 1 TABLET BY MOUTH DAILY 05/26/24   Pietro Redell RAMAN, MD  valACYclovir  (VALTREX ) 1000 MG tablet Take 1 tablet (1,000 mg total) by mouth 2 (two) times daily. Patient taking differently: Take 1,000 mg by mouth as needed. 05/17/23   Alvia Bring, DO    Allergies: Injectafer [ferric carboxymaltose], Penicillins, Accupril [quinapril hcl], Celebrex [celecoxib], Cozaar  [losartan  potassium], Dml forte, Entresto [sacubitril-valsartan], Flonase  [fluticasone ], Lactose intolerance (gi), Lipitor [atorvastatin ], Nasonex  [mometasone  furoate], Nsaids, Advil [ibuprofen], Baking soda-fluoride [sodium fluoride], Bayer aspirin  [aspirin ], Chamomile, Clindamycin /lincomycin, Codeine, Lasix  [furosemide ], Miralax  [polyethylene glycol (macrogol)], Quinine, Reclast  [zoledronic  acid], Rifadin [rifampin], Singulair  [montelukast ], Sulfa antibiotics, Ultram  [tramadol ], Vesicare  [solifenacin ], Wellbutrin  [bupropion ], and Wool alcohol [lanolin]    Review of Systems  Constitutional:  Positive for chills and fatigue. Negative for fever.  HENT:  Negative for congestion.   Eyes:  Negative for visual disturbance.  Respiratory:  Positive for cough, chest tightness and shortness of breath. Negative for wheezing.   Cardiovascular:  Positive for chest pain and leg swelling. Negative for palpitations.  Gastrointestinal:  Positive for abdominal pain. Negative for constipation,  diarrhea, nausea and vomiting.  Genitourinary:  Positive for decreased urine volume. Negative for dysuria and flank pain.  Musculoskeletal:  Negative for back pain, neck pain and neck stiffness.  Skin:  Negative for rash and wound.  Neurological:  Negative for light-headedness, numbness and headaches.  Psychiatric/Behavioral:  Negative for agitation and confusion.   All other systems reviewed and are negative.   Updated Vital Signs BP 114/74   Pulse 88   Temp (!) 97.4 F (36.3 C) (Temporal)   Resp (!) 26   SpO2 94%   Physical Exam Vitals and nursing note reviewed.  Constitutional:      General: She is not in acute distress.    Appearance: She is well-developed. She is not ill-appearing, toxic-appearing or diaphoretic.  HENT:     Head: Normocephalic and atraumatic.     Nose: No congestion or rhinorrhea.     Mouth/Throat:     Mouth: Mucous membranes are moist.  Eyes:     Extraocular Movements: Extraocular movements intact.     Conjunctiva/sclera: Conjunctivae normal.     Pupils: Pupils are equal, round, and reactive to light.  Cardiovascular:     Rate and Rhythm: Regular rhythm. Bradycardia present.     Heart sounds: No murmur heard. Pulmonary:     Effort: Pulmonary effort is normal.  No respiratory distress.     Breath sounds: Rhonchi and rales present. No wheezing.  Chest:     Chest wall: No tenderness.  Abdominal:     General: Abdomen is flat.     Palpations: Abdomen is soft.     Tenderness: There is abdominal tenderness. There is no guarding or rebound.  Musculoskeletal:        General: No swelling or tenderness.     Cervical back: Neck supple. No tenderness.     Right lower leg: No edema.     Left lower leg: Edema present.  Skin:    General: Skin is warm and dry.     Capillary Refill: Capillary refill takes less than 2 seconds.     Findings: No erythema or rash.  Neurological:     General: No focal deficit present.     Mental Status: She is alert.   Psychiatric:        Mood and Affect: Mood normal.     (all labs ordered are listed, but only abnormal results are displayed) Labs Reviewed  BRAIN NATRIURETIC PEPTIDE - Abnormal; Notable for the following components:      Result Value   B Natriuretic Peptide 1,523.2 (*)    All other components within normal limits  CBC WITH DIFFERENTIAL/PLATELET - Abnormal; Notable for the following components:   RBC 3.74 (*)    MCV 101.1 (*)    All other components within normal limits  COMPREHENSIVE METABOLIC PANEL WITH GFR - Abnormal; Notable for the following components:   Glucose, Bld 111 (*)    Creatinine, Ser 1.06 (*)    Albumin 3.3 (*)    AST 61 (*)    Alkaline Phosphatase 144 (*)    GFR, Estimated 55 (*)    All other components within normal limits  URINALYSIS, W/ REFLEX TO CULTURE (INFECTION SUSPECTED) - Abnormal; Notable for the following components:   APPearance CLOUDY (*)    Protein, ur 100 (*)    Leukocytes,Ua MODERATE (*)    Bacteria, UA RARE (*)    All other components within normal limits  I-STAT CHEM 8, ED - Abnormal; Notable for the following components:   Creatinine, Ser 1.10 (*)    Glucose, Bld 111 (*)    All other components within normal limits  TROPONIN I (HIGH SENSITIVITY) - Abnormal; Notable for the following components:   Troponin I (High Sensitivity) 21 (*)    All other components within normal limits  RESP PANEL BY RT-PCR (RSV, FLU A&B, COVID)  [id number]  URINE CULTURE  LIPASE, BLOOD  TSH  MAGNESIUM   I-STAT CG4 LACTIC ACID, ED  I-STAT CG4 LACTIC ACID, ED  TROPONIN I (HIGH SENSITIVITY)    EKG: EKG Interpretation Date/Time:  Thursday July 13 2024 13:48:21 EST Ventricular Rate:  89 PR Interval:  163 QRS Duration:  189 QT Interval:  460 QTC Calculation: 560 R Axis:   130  Text Interpretation: Sinus rhythm RBBB and LPFB Repol abnrm suggests ischemia, lateral leads when compared to prior, similar appearance No STEMI still appears paced. Reconfirmed by  Ginger Barefoot ([id number]) on 07/13/2024 2:25:08 PM  Radiology: CT Angio Chest PE W and/or Wo Contrast Result Date: 07/13/2024 CLINICAL DATA:  High probability for PE.  CHF exacerbation. EXAM: CT ANGIOGRAPHY CHEST WITH CONTRAST TECHNIQUE: Multidetector CT imaging of the chest was performed using the standard protocol during bolus administration of intravenous contrast. Multiplanar CT image reconstructions and MIPs were obtained to evaluate the vascular anatomy. RADIATION DOSE REDUCTION: This exam  was performed according to the departmental dose-optimization program which includes automated exposure control, adjustment of the mA and/or kV according to patient size and/or use of iterative reconstruction technique. CONTRAST:  75mL OMNIPAQUE  IOHEXOL  350 MG/ML SOLN COMPARISON:  CT of the chest 04/23/2023. FINDINGS: Cardiovascular: Aorta is normal in size. Heart is enlarged. Left-sided pacemaker is present. There is a small pericardial effusion. There is adequate opacification of the pulmonary arteries to the segmental level. No pulmonary embolism identified. Mediastinum/Nodes: There are nonenlarged mediastinal lymph nodes diffusely, increased in size and number compared to prior study. Mildly enlarged right hilar lymph nodes have increased from prior measuring up to 10 mm. Lungs/Pleura: There is no pleural effusion or pneumothorax. There some smooth interlobular septal thickening in the lung bases with mild ground-glass opacities. There is a band of atelectasis in the left lower lobe. Upper Abdomen: No acute abnormality. Musculoskeletal: Right mastectomy changes are present. There are calcifications in the left breast. Right shoulder arthroplasty is present. Review of the MIP images confirms the above findings. IMPRESSION: 1. No evidence for pulmonary embolism. 2. Cardiomegaly with small pericardial effusion. 3. Mild interstitial edema. 4. Mild mediastinal and right hilar lymphadenopathy, increased from prior. This may  be reactive. Electronically Signed   By: Greig Pique M.D.   On: 07/13/2024 15:43   CT ABDOMEN PELVIS W CONTRAST Result Date: 07/13/2024 EXAM: CT ABDOMEN AND PELVIS WITH CONTRAST 07/13/2024 03:19:47 PM TECHNIQUE: CT of the abdomen and pelvis was performed with the administration of 75 mL of iohexol  (OMNIPAQUE ) 350 MG/ML injection. Multiplanar reformatted images are provided for review. Automated exposure control, iterative reconstruction, and/or weight-based adjustment of the mA/kV was utilized to reduce the radiation dose to as low as reasonably achievable. COMPARISON: 11/29/2023 CLINICAL HISTORY: Abdominal pain, acute, nonlocalized; Upper abdominal pain and chest pain, pleuritic symptoms. Shortness of breath. Cannot lay flat. Concern for heart failure or other intra-abdominal pathology versus PE versus fluid overload/pneumonia. FINDINGS: LOWER CHEST: Mild septal thickening seen in visualized lung bases concerning for edema. LIVER: The liver is unremarkable. GALLBLADDER AND BILE DUCTS: Moderate gallbladder wall thickening is noted with cholelithiasis concerning for possible cholecystitis. Ultrasound is recommended for further evaluation. No biliary ductal dilatation. SPLEEN: No acute abnormality. PANCREAS: No acute abnormality. ADRENAL GLANDS: No acute abnormality. KIDNEYS, URETERS AND BLADDER: No stones in the kidneys or ureters. No hydronephrosis. No perinephric or periureteral stranding. Urinary bladder is unremarkable. GI AND BOWEL: Stomach demonstrates no acute abnormality. There is no bowel obstruction. Diverticulosis of descending and sigmoid colon is noted without inflammation. PERITONEUM AND RETROPERITONEUM: No ascites. No free air. VASCULATURE: Aorta is normal in caliber. LYMPH NODES: No lymphadenopathy. REPRODUCTIVE ORGANS: Status post hysterectomy. BONES AND SOFT TISSUES: No acute osseous abnormality. No focal soft tissue abnormality. IMPRESSION: 1. Moderate gallbladder wall thickening with  cholelithiasis, suspicious for acute cholecystitis; ultrasound is recommended for further evaluation. 2. Mild septal thickening in the visualized lung bases, suspicious for edema. 3. Diverticulosis of the descending and sigmoid colon without inflammation. Electronically signed by: Lynwood Seip MD 07/13/2024 03:36 PM EST RP Workstation: [id number]     Procedures   Medications Ordered in the ED  iohexol  (OMNIPAQUE ) 350 MG/ML injection 75 mL (75 mLs Intravenous Contrast Given 07/13/24 1520)                                    Medical Decision Making Amount and/or Complexity of Data Reviewed Labs: ordered. Radiology: ordered.  Risk  Prescription drug management.    LADAISHA PORTILLO is a 73 y.o. female with a past medical history significant for previous breast cancer, CHF with pacemaker and defibrillator, previous ventricular tachycardia, previous SVT, COPD, hypothyroidism, kidney stones, mitral regurgitation, tricuspid regurgitation, pulmonary hypertension, hypertension, diabetes, hyperlipidemia, and fibromyalgia who presents for abdominal pain, chest pain, shortness of breath, cough, chills, and edema.  According patient, for the last week or so she has been having worsened peripheral edema has been taking her medications including torsemide  and spironolactone .  She told nursing that she missed a dose of spironolactone  yesterday but otherwise has been compliant with medications.  She reports edema has been worsening and rapidly worsened today.  She also reports over the last few days she has been having worsened cough with some productive phlegm but no hemoptysis reported.  She reports a pressure and tightness in her upper abdomen in her chest that does not go to her back.  It is severe.  Is very pleuritic.  She feels like she cannot take a deep breath.  She does feel that she is fluid overloaded with edema in her legs and she reports she has stopped urinating well.  She denies dysuria.  Reports  some diarrhea but no constipation.  She denies any trauma.  Denies headache or neck pain.  No other back pain reported.  No other complaints.  On exam, lungs have some rhonchi and rales in both sides.  She has a murmur.  Chest is nontender but abdomen is tender in the central and upper abdomen.  I did hear bowel sounds.  Back and flanks nontender.  Distally she has edema in both legs and she did have pulses intact in extremities.  No focal neurologic deficits initially.  EKG does not show STEMI.  Clinically I am concerned about likely acute fluid overload causing pleural effusion or pulmonary edema and worsened breathing however with this pleuritic discomfort and cancer history, I am concerned about possible pulmonary embolism.  With the abdominal tenderness on exam will get imaging to rule out intra-abdominal pathology as well.  Will get screening labs, COVID swab given the ongoing pandemic, and then CTs of the chest abdomen and pelvis.  Due to her appearance and symptoms, anticipate she will likely need admission for further management.  Anticipate reassessment after workup.      Care transferred oncoming team to wait for workup results and reassessment.  Given the patient's rapid worsening of the last 24 hours with edema and shortness of breath and pain, anticipate she may require admission.  3:49 PM CT shows evidence of possible cholecystitis and gallbladder wall thickening and distention.  I called and spoke to general surgery who will see patient but they did not feel she would need emergent surgery today.  They agree with medical admission for CHF exacerbation and they will evaluate to determine if she needs HIDA scan versus ultrasound versus further management of her gallbladder.  Patient will be admitted for further management.    General surgery will see and patient will be admitted for further management.    Final diagnoses:  SOB (shortness of breath)  Chest pain, unspecified type   Abdominal pain, unspecified abdominal location  Peripheral edema    Clinical Impression: 1. SOB (shortness of breath)   2. Chest pain, unspecified type   3. Abdominal pain, unspecified abdominal location   4. Peripheral edema     Disposition: Care will be transferred oncoming team to wait for results of CT imaging and  labs.  Anticipate reassessment after workup to determine disposition.  This note was prepared with assistance of Conservation officer, historic buildings. Occasional wrong-word or sound-a-like substitutions may have occurred due to the inherent limitations of voice recognition software.      Kimaria Struthers, Lonni PARAS, MD 07/13/24 1534    Briston Lax, Lonni PARAS, MD 07/13/24 [phone]

## 2024-07-13 NOTE — ED Triage Notes (Signed)
 Pt arrived via [facility] from home c/o CHF exacerbation, including fluid retention, shortness of breath, and edema. Pt has missed her spironolactone  yesterday but has been taking her torsemide  regularly. Pt has +2 pitting edema in bilateral lower extremities upon assessment.

## 2024-07-14 ENCOUNTER — Inpatient Hospital Stay (HOSPITAL_COMMUNITY)

## 2024-07-14 ENCOUNTER — Telehealth (HOSPITAL_COMMUNITY): Payer: Self-pay

## 2024-07-14 ENCOUNTER — Other Ambulatory Visit (HOSPITAL_COMMUNITY): Payer: Self-pay

## 2024-07-14 DIAGNOSIS — R0602 Shortness of breath: Secondary | ICD-10-CM | POA: Diagnosis not present

## 2024-07-14 DIAGNOSIS — I5023 Acute on chronic systolic (congestive) heart failure: Secondary | ICD-10-CM | POA: Diagnosis not present

## 2024-07-14 LAB — CBC WITH DIFFERENTIAL/PLATELET
Abs Immature Granulocytes: 0.02 K/uL (ref 0.00–0.07)
Basophils Absolute: 0 K/uL (ref 0.0–0.1)
Basophils Relative: 1 %
Eosinophils Absolute: 0.2 K/uL (ref 0.0–0.5)
Eosinophils Relative: 3 %
HCT: 35.9 % — ABNORMAL LOW (ref 36.0–46.0)
Hemoglobin: 11.5 g/dL — ABNORMAL LOW (ref 12.0–15.0)
Immature Granulocytes: 0 %
Lymphocytes Relative: 22 %
Lymphs Abs: 1.3 K/uL (ref 0.7–4.0)
MCH: 31.9 pg (ref 26.0–34.0)
MCHC: 32 g/dL (ref 30.0–36.0)
MCV: 99.7 fL (ref 80.0–100.0)
Monocytes Absolute: 0.6 K/uL (ref 0.1–1.0)
Monocytes Relative: 10 %
Neutro Abs: 4 K/uL (ref 1.7–7.7)
Neutrophils Relative %: 64 %
Platelets: 207 K/uL (ref 150–400)
RBC: 3.6 MIL/uL — ABNORMAL LOW (ref 3.87–5.11)
RDW: 14.7 % (ref 11.5–15.5)
WBC: 6.1 K/uL (ref 4.0–10.5)
nRBC: 0 % (ref 0.0–0.2)

## 2024-07-14 LAB — HEPATIC FUNCTION PANEL
ALT: 31 U/L (ref 0–44)
AST: 39 U/L (ref 15–41)
Albumin: 3 g/dL — ABNORMAL LOW (ref 3.5–5.0)
Alkaline Phosphatase: 121 U/L (ref 38–126)
Bilirubin, Direct: 0.2 mg/dL (ref 0.0–0.2)
Indirect Bilirubin: 0.7 mg/dL (ref 0.3–0.9)
Total Bilirubin: 0.9 mg/dL (ref 0.0–1.2)
Total Protein: 6.2 g/dL — ABNORMAL LOW (ref 6.5–8.1)

## 2024-07-14 LAB — BASIC METABOLIC PANEL WITH GFR
Anion gap: 9 (ref 5–15)
BUN: 14 mg/dL (ref 8–23)
CO2: 24 mmol/L (ref 22–32)
Calcium: 9 mg/dL (ref 8.9–10.3)
Chloride: 103 mmol/L (ref 98–111)
Creatinine, Ser: 0.86 mg/dL (ref 0.44–1.00)
GFR, Estimated: >60 mL/min (ref >60)
Glucose, Bld: 100 mg/dL — ABNORMAL HIGH (ref 70–99)
Potassium: 3.7 mmol/L (ref 3.5–5.1)
Sodium: 136 mmol/L (ref 135–145)

## 2024-07-14 LAB — URINE CULTURE: Culture: 80000 — AB

## 2024-07-14 LAB — MAGNESIUM: Magnesium: 2 mg/dL (ref 1.7–2.4)

## 2024-07-14 MED ORDER — POLYVINYL ALCOHOL 1.4 % OP SOLN
2.0000 [drp] | OPHTHALMIC | Status: DC | PRN
  Administered 2024-07-14 – 2024-07-15 (×2): 2 [drp] via OPHTHALMIC
  Filled 2024-07-14: qty 15

## 2024-07-14 MED ORDER — TECHNETIUM TC 99M MEBROFENIN IV KIT
5.2600 | PACK | Freq: Once | INTRAVENOUS | Status: AC | PRN
  Administered 2024-07-14: 5.26 via INTRAVENOUS

## 2024-07-14 NOTE — Telephone Encounter (Signed)
 Pharmacy Patient Advocate Encounter  Insurance verification completed.    The patient is insured through [facility]. Patient has Medicare and is not eligible for a copay card, but may be able to apply for patient assistance or Medicare RX Payment Plan (Patient Must reach out to their plan, if eligible for payment plan), if available.    Ran test claim for Breo Ellipta  200-62mcg and the current 30 day co-pay is $0.  Ran test claim for Incruse Ellipta  62.12mcg and the current 30 day co-pay is $0.   This test claim was processed through [employer]- copay amounts may vary at other pharmacies due to [employer], or as the patient moves through the different stages of their insurance plan.

## 2024-07-14 NOTE — Progress Notes (Signed)
 Progress Note    Tanya Hogan   FMW:[id number]  DOB: [birth year]  DOA: 07/13/2024     1 PCP: [name], MD  Initial CC: SOB, LE edema  Hospital Course: Tanya Hogan is a 73 yo female with PMH HFrEF (EF 20-25%), HTN, pulmonary HTN, HLD, OSA, NASH, GERD, IBS-C, hypothyroidism, DM II, depression, fibromyalgia, breast cancer s/p mastectomy, Ventricular tachyarrhythmia s/p CRT-D (05/31/24). She is a poor historian but overall able to state that she presents mostly due to shortness of breath.  With further questioning, she states that she has felt short of breath due to abdominal distention and difficulty with taking a big breath due to this.  She has also noticed worsening lower extremity swelling recently.  She endorsed recently finishing a course of antibiotics for diverticulitis but when asked when course was completed, she was unable to state. With chart review the CT showing diverticulitis was 05/07/2024 and care everywhere and she was discharged on 05/10/2024 with ciprofloxacin  and Flagyl  to finish 4 more days at discharge.  Unclear if she has really been having diarrhea for that long or if more recent.  She does endorse actual abdominal pain also with questioning which appears to be more localized to the RUQ.  Multiple recent hospitalizations briefly summarized below also:  1) Most recent was 10/30/ - 10/31 at [facility] medical center for vision changes described as floaters.  She had CT head which was negative and CT angio head/neck also negative.  MRI brain unable to be performed due to pacemaker incompatibility.  Her symptoms improved and she was discharged home. She followed up with neurology on 07/07/2024 and was recommended to have repeat CT head at that time to pick up on any possible stroke that may have been present.  She also has upcoming appointment with ophthalmology on 08/11/2024.  Repeat CT head has not yet been performed since neurology visit on 11/14.  2) 10/13  until 10/15 due to ventricular tachycardia.  She was evaluated by cardiology and EP.  She underwent dosage increase of quinidine.  3) 9/19 - 10/1 for palpitations and VT   In the ER, initial concern was for potential CHF exacerbation due to her SOB and LE edema. BNP elevated higher than prior values, 1,523.  CTA chest negative for PE. Small pericardial effusion, mild interstitial edema.  Has missed some doses of spironolactone  at home recently but compliant on torsemide  she says. Has not taken any extra doses.  Believes she has gained about 8 pounds recently.   With more workup, due to RUQ pain, underwent CT A/P which showed moderate gallbladder wall thickening with cholelithiasis concerning for potential acute cholecystitis.  Diverticulosis noted with no further inflammation. General surgery was consulted for further assistance with the gallbladder workup.  She was afebrile with no leukocytosis. No antibiotics were initiated.   Assessment & Plan:  SOB LE edema Acute on chronic combined systolic and diastolic CHF - potentially mild CHF exacerbation given weight gain, LE edema, SOB (but may be confounded by abd pain limiting breathing and body habitus) - Mild interstitial edema on CTA chest, small pericardial effusion - BNP 1,523 higher than prior values - last echo 05/13/24: EF 20-25%, Gr 2 DD  - Endorsed missing a few doses recently of spironolactone  but has been compliant on torsemide .  Approximately 8 pound weight gain - allergy noted to lasix ; trial given in ER, patient did not like nor want anymore - has tolerated IV bumex  in past, will try that next  for diuresis and likely involve cardiology if HIDA positive, otherwise, outpt follow up should be okay  - continue bumex  2 mg daily  Abdominal pain - appears to be more localized to RUQ - CT A/P negative for diverticulitis; shows GB wall thickening with cholelithiasis - General Surgery consulted on admission and recommending HIDA  scan and holding off on antibiotics for now - Nontoxic-appearing and afebrile with no leukocytosis - follow up HIDA results   Diarrhea - unclear duration and patient very poor historian  - last abx cipro /flagyl  completed ~ 9/21 - check cdiff and GI panel  - So far, has not had any further diarrhea since admission  Hx monomorphic VT - followed by EP - recent upgrade in October 2025 of [employer] CRT-P to CRT-D and addition of RV lead  - Continue amiodarone  and quinidine (latter recently increased also) - Continue Coreg   NASH Elevated LFTs - hold statin for now with acutely elevated LFTs - pattern not consistent with obstruction   HTN - continue coreg   DMII - 5.5% on 06/05/24 - continue diet control    COPD No signs of exacerbation  - continue breathing treatments as needed   Obesity - Complicates overall prognosis and care - Body mass index is 35.82 kg/m.   Hypothyroidism Continue with levothyroxine   Interval History:  Abdominal pain seems to be a little bit better this morning and not as tender in the RUQ. Voided fairly well but does not wish to have any further Lasix ; continuing with IV Bumex  today. Undergoing HIDA scan today.   Antimicrobials:   DVT prophylaxis:  enoxaparin  (LOVENOX ) injection 40 mg Start: 07/13/24 2130   Code Status:   Code Status: Full Code  Mobility Assessment (Last 72 Hours)     Mobility Assessment     Row Name 07/13/24 1930           Does the patient have exclusion criteria? No - Perform mobility assessment       What is the highest level of mobility based on the mobility assessment? Level 5 (Ambulates independently) - Balance while walking independently - Complete          Diet: Diet Orders (From admission, onward)     Start     Ordered   07/14/24 0001  Diet NPO time specified Except for: Sips with Meds  Diet effective midnight       Question:  Except for  Answer:  Tanya Hogan with Meds   07/13/24 1624             Barriers to discharge: none Disposition Plan:  Home  HH orders placed: n/a Status is: Inpt   Objective: Blood pressure 115/78, pulse 89, temperature 97.7 F (36.5 C), temperature source Oral, resp. rate 18, height 5' 1 (1.549 m), weight 86 kg, SpO2 96%.  Examination:  Physical Exam Constitutional:      Appearance: Normal appearance.  HENT:     Head: Normocephalic and atraumatic.     Mouth/Throat:     Mouth: Mucous membranes are moist.  Eyes:     Extraocular Movements: Extraocular movements intact.  Cardiovascular:     Rate and Rhythm: Normal rate and regular rhythm.  Pulmonary:     Effort: Pulmonary effort is normal. No respiratory distress.     Breath sounds: Normal breath sounds.  Abdominal:     General: Bowel sounds are normal.     Palpations: Abdomen is soft.     Tenderness: There is abdominal tenderness (improved today) in the right  upper quadrant.  Musculoskeletal:        General: Normal range of motion.     Cervical back: Normal range of motion and neck supple.     Right lower leg: Edema (1-2+) present.     Left lower leg: Edema (1-2+) present.  Neurological:     General: No focal deficit present.     Mental Status: She is alert.  Psychiatric:        Mood and Affect: Mood normal.      Consultants:  General surgery  Procedures:    Data Reviewed: Results for orders placed or performed during the hospital encounter of 07/13/24 (from the past 24 hours)  Resp panel by RT-PCR (RSV, Flu A&B, Covid) Anterior Nasal Swab     Status: None   Collection Time: 07/13/24  2:04 PM   Specimen: Anterior Nasal Swab  Result Value Ref Range   SARS Coronavirus 2 by RT PCR NEGATIVE NEGATIVE   Influenza A by PCR NEGATIVE NEGATIVE   Influenza B by PCR NEGATIVE NEGATIVE   Resp Syncytial Virus by PCR NEGATIVE NEGATIVE  Brain natriuretic peptide     Status: Abnormal   Collection Time: 07/13/24  2:30 PM  Result Value Ref Range   B Natriuretic Peptide 1,523.2 (H) 0.0 - 100.0  pg/mL  CBC with Differential     Status: Abnormal   Collection Time: 07/13/24  2:30 PM  Result Value Ref Range   WBC 7.6 4.0 - 10.5 K/uL   RBC 3.74 (L) 3.87 - 5.11 MIL/uL   Hemoglobin 12.0 12.0 - 15.0 g/dL   HCT 62.1 63.9 - 53.9 %   MCV 101.1 (H) 80.0 - 100.0 fL   MCH 32.1 26.0 - 34.0 pg   MCHC 31.7 30.0 - 36.0 g/dL   RDW 85.3 88.4 - 84.4 %   Platelets 235 150 - 400 K/uL   nRBC 0.0 0.0 - 0.2 %   Neutrophils Relative % 77 %   Neutro Abs 5.8 1.7 - 7.7 K/uL   Lymphocytes Relative 15 %   Lymphs Abs 1.1 0.7 - 4.0 K/uL   Monocytes Relative 7 %   Monocytes Absolute 0.5 0.1 - 1.0 K/uL   Eosinophils Relative 1 %   Eosinophils Absolute 0.1 0.0 - 0.5 K/uL   Basophils Relative 0 %   Basophils Absolute 0.0 0.0 - 0.1 K/uL   Immature Granulocytes 0 %   Abs Immature Granulocytes 0.02 0.00 - 0.07 K/uL  Comprehensive metabolic panel     Status: Abnormal   Collection Time: 07/13/24  2:30 PM  Result Value Ref Range   Sodium 135 135 - 145 mmol/L   Potassium 4.9 3.5 - 5.1 mmol/L   Chloride 102 98 - 111 mmol/L   CO2 23 22 - 32 mmol/L   Glucose, Bld 111 (H) 70 - 99 mg/dL   BUN 20 8 - 23 mg/dL   Creatinine, Ser 8.93 (H) 0.44 - 1.00 mg/dL   Calcium  9.7 8.9 - 10.3 mg/dL   Total Protein 7.0 6.5 - 8.1 g/dL   Albumin 3.3 (L) 3.5 - 5.0 g/dL   AST 61 (H) 15 - 41 U/L   ALT 37 0 - 44 U/L   Alkaline Phosphatase 144 (H) 38 - 126 U/L   Total Bilirubin 0.9 0.0 - 1.2 mg/dL   GFR, Estimated 55 (L) >60 mL/min   Anion gap 10 5 - 15  Lipase, blood     Status: None   Collection Time: 07/13/24  2:30 PM  Result  Value Ref Range   Lipase 28 11 - 51 U/L  TSH     Status: None   Collection Time: 07/13/24  2:30 PM  Result Value Ref Range   TSH 3.447 0.350 - 4.500 uIU/mL  Magnesium      Status: None   Collection Time: 07/13/24  2:30 PM  Result Value Ref Range   Magnesium  2.4 1.7 - 2.4 mg/dL  Troponin I (High Sensitivity)     Status: Abnormal   Collection Time: 07/13/24  2:30 PM  Result Value Ref Range    Troponin I (High Sensitivity) 21 (H) <18 ng/L  Urinalysis, w/ Reflex to Culture (Infection Suspected) -Urine, Clean Catch     Status: Abnormal   Collection Time: 07/13/24  2:35 PM  Result Value Ref Range   Specimen Source URINE, CLEAN CATCH    Color, Urine YELLOW YELLOW   APPearance CLOUDY (A) CLEAR   Specific Gravity, Urine 1.020 1.005 - 1.030   pH 5.0 5.0 - 8.0   Glucose, UA NEGATIVE NEGATIVE mg/dL   Hgb urine dipstick NEGATIVE NEGATIVE   Bilirubin Urine NEGATIVE NEGATIVE   Ketones, ur NEGATIVE NEGATIVE mg/dL   Protein, ur 899 (A) NEGATIVE mg/dL   Nitrite NEGATIVE NEGATIVE   Leukocytes,Ua MODERATE (A) NEGATIVE   RBC / HPF 0-5 0 - 5 RBC/hpf   WBC, UA 21-50 0 - 5 WBC/hpf   Bacteria, UA RARE (A) NONE SEEN   Squamous Epithelial / HPF 0-5 0 - 5 /HPF   WBC Clumps PRESENT    Mucus PRESENT    Hyaline Casts, UA PRESENT    Ca Oxalate Crys, UA PRESENT   I-Stat CG4 Lactic Acid     Status: None   Collection Time: 07/13/24  2:44 PM  Result Value Ref Range   Lactic Acid, Venous 1.2 0.5 - 1.9 mmol/L  I-stat chem 8, ED (not at [facility], [facility] or [facility])     Status: Abnormal   Collection Time: 07/13/24  2:44 PM  Result Value Ref Range   Sodium 138 135 - 145 mmol/L   Potassium 4.7 3.5 - 5.1 mmol/L   Chloride 104 98 - 111 mmol/L   BUN 21 8 - 23 mg/dL   Creatinine, Ser 8.89 (H) 0.44 - 1.00 mg/dL   Glucose, Bld 888 (H) 70 - 99 mg/dL   Calcium , Ion 1.25 1.15 - 1.40 mmol/L   TCO2 24 22 - 32 mmol/L   Hemoglobin 12.6 12.0 - 15.0 g/dL   HCT 62.9 63.9 - 53.9 %  Troponin I (High Sensitivity)     Status: Abnormal   Collection Time: 07/13/24  5:20 PM  Result Value Ref Range   Troponin I (High Sensitivity) 20 (H) <18 ng/L  Basic metabolic panel with GFR     Status: Abnormal   Collection Time: 07/14/24  2:18 AM  Result Value Ref Range   Sodium 136 135 - 145 mmol/L   Potassium 3.7 3.5 - 5.1 mmol/L   Chloride 103 98 - 111 mmol/L   CO2 24 22 - 32 mmol/L   Glucose, Bld 100 (H) 70 - 99 mg/dL   BUN 14 8 - 23  mg/dL   Creatinine, Ser 9.13 0.44 - 1.00 mg/dL   Calcium  9.0 8.9 - 10.3 mg/dL   GFR, Estimated >39 >39 mL/min   Anion gap 9 5 - 15  CBC with Differential/Platelet     Status: Abnormal   Collection Time: 07/14/24  2:18 AM  Result Value Ref Range   WBC 6.1 4.0 -  10.5 K/uL   RBC 3.60 (L) 3.87 - 5.11 MIL/uL   Hemoglobin 11.5 (L) 12.0 - 15.0 g/dL   HCT 64.0 (L) 63.9 - 53.9 %   MCV 99.7 80.0 - 100.0 fL   MCH 31.9 26.0 - 34.0 pg   MCHC 32.0 30.0 - 36.0 g/dL   RDW 85.2 88.4 - 84.4 %   Platelets 207 150 - 400 K/uL   nRBC 0.0 0.0 - 0.2 %   Neutrophils Relative % 64 %   Neutro Abs 4.0 1.7 - 7.7 K/uL   Lymphocytes Relative 22 %   Lymphs Abs 1.3 0.7 - 4.0 K/uL   Monocytes Relative 10 %   Monocytes Absolute 0.6 0.1 - 1.0 K/uL   Eosinophils Relative 3 %   Eosinophils Absolute 0.2 0.0 - 0.5 K/uL   Basophils Relative 1 %   Basophils Absolute 0.0 0.0 - 0.1 K/uL   Immature Granulocytes 0 %   Abs Immature Granulocytes 0.02 0.00 - 0.07 K/uL  Magnesium      Status: None   Collection Time: 07/14/24  2:18 AM  Result Value Ref Range   Magnesium  2.0 1.7 - 2.4 mg/dL  Hepatic function panel     Status: Abnormal   Collection Time: 07/14/24  2:18 AM  Result Value Ref Range   Total Protein 6.2 (L) 6.5 - 8.1 g/dL   Albumin 3.0 (L) 3.5 - 5.0 g/dL   AST 39 15 - 41 U/L   ALT 31 0 - 44 U/L   Alkaline Phosphatase 121 38 - 126 U/L   Total Bilirubin 0.9 0.0 - 1.2 mg/dL   Bilirubin, Direct 0.2 0.0 - 0.2 mg/dL   Indirect Bilirubin 0.7 0.3 - 0.9 mg/dL   *Note: Due to a large number of results and/or encounters for the requested time period, some results have not been displayed. A complete set of results can be found in Results Review.    I have reviewed pertinent nursing notes, vitals, labs, and images as necessary. I have ordered labwork to follow up on as indicated.  I have reviewed the last notes from staff over past 24 hours. I have discussed patient's care plan and test results with nursing staff,  CM/SW, and other staff as appropriate.  Old records reviewed in assessment of this patient  Time spent: Greater than 50% of the 55 minute visit was spent in counseling/coordination of care for the patient as laid out in the A&P.   LOS: 1 day   Alm Apo, MD [facility] 07/14/2024, 11:03 AM

## 2024-07-14 NOTE — TOC Initial Note (Signed)
 Transition of Care [facility]) - Initial/Assessment Note    Patient Details  Name: Tanya Hogan MRN: [id number] Date of Birth: November 23, 1950  Transition of Care [facility]) CM/SW Contact:    Waddell Barnie Rama, RN Phone Number: 07/14/2024, 11:19 AM  Clinical Narrative:                 From home with brother, has PCP and insurance on file, states has [facility] services in place  with [facility] for HHRN,HHPT and would like to continue with them.  Has Stengel, grab bars, neb machine and bipap machine at home.  States will need a cab at dc her brother does not drive.  States he has somewhat  family support system, states gets medications from [facility] on [address] in [place] or [facility] home delivery and one she gets from [place] .  Pta self ambulatory Teng.       Expected Discharge Plan: Home w Home Health Services Barriers to Discharge: Continued Medical Work up   Patient Goals and CMS Choice Patient states their goals for this hospitalization and ongoing recovery are:: return home and continue [facility] CMS Medicare.gov Compare Post Acute Care list provided to:: Patient Choice offered to / list presented to : Patient      Expected Discharge Plan and Services   Discharge Planning Services: CM Consult Post Acute Care Choice: Home Health Living arrangements for the past 2 months: Single Family Home                 DME Arranged: N/A DME Agency: NA, [facility]       HH Arranged: PT, RN HH Agency: [facility]        Prior Living Arrangements/Services Living arrangements for the past 2 months: Single Family Home Lives with:: Siblings (brother) Patient language and need for interpreter reviewed:: Yes Do you feel safe going back to the place where you live?: Yes      Need for Family Participation in Patient Care: No (Comment) Care giver support system in place?: Yes (comment) Current home services: Home PT, Home RN, DME ([employer], grab bars, bsc,bipap, neb machine) Criminal  Activity/Legal Involvement Pertinent to Current Situation/Hospitalization: No - Comment as needed  Activities of Daily Living   ADL Screening (condition at time of admission) Independently performs ADLs?: No Does the patient have a NEW difficulty with bathing/dressing/toileting/self-feeding that is expected to last >3 days?: No Does the patient have a NEW difficulty with getting in/out of bed, walking, or climbing stairs that is expected to last >3 days?: No Does the patient have a NEW difficulty with communication that is expected to last >3 days?: No Is the patient deaf or have difficulty hearing?: No Does the patient have difficulty seeing, even when wearing glasses/contacts?: No Does the patient have difficulty concentrating, remembering, or making decisions?: No  Permission Sought/Granted Permission sought to share information with : Case Manager Permission granted to share information with : Yes, Verbal Permission Granted     Permission granted to share info w AGENCY: HH        Emotional Assessment Appearance:: Appears stated age Attitude/Demeanor/Rapport: Engaged Affect (typically observed): Appropriate Orientation: : Oriented to Self, Oriented to Place, Oriented to  Time, Oriented to Situation Alcohol  / Substance Use: Not Applicable Psych Involvement: No (comment)  Admission diagnosis:  Peripheral edema [R60.0] SOB (shortness of breath) [R06.02] Abdominal pain, unspecified abdominal location [R10.9] Chest pain, unspecified type [R07.9] Patient Active Problem List   Diagnosis Date Noted   SOB (shortness  of breath) 07/13/2024   Hypokalemia 06/07/2024   Ventricular tachycardia (HCC) 05/28/2024   SVT (supraventricular tachycardia) 05/13/2024   Brain fog 11/23/2023   Overflow diarrhea 11/23/2023   Sensation as if bladder still full 11/23/2023   Unintended weight loss 11/23/2023   Fracture of base of fifth metatarsal bone of left foot 11/02/2023   Presence of heart  assist device (HCC) 10/07/2023   Primary osteoarthritis of left shoulder 09/08/2023   Nonischemic cardiomyopathy (HCC) 02/16/2023   Class 3 obesity (HCC) 10/30/2022   Acute on chronic systolic CHF (congestive heart failure) (HCC) 10/29/2022   Stress and adjustment reaction 04/08/2022   Gastroesophageal reflux disease 02/16/2022   AV block, 3rd degree (HCC) 10/24/2021   COPD (chronic obstructive pulmonary disease) (HCC) 09/21/2021   Osteoporosis 02/28/2021   Primary osteoarthritis of both knees 01/14/2021   Irritable bowel syndrome with constipation 09/16/2020   Aortic atherosclerosis 06/11/2020   Benign lipomatous neoplasm of skin and subcutaneous tissue of left leg 06/11/2020   NSVT (nonsustained ventricular tachycardia) (HCC) 02/14/2020   Chronic constipation 10/06/2019   Genetic testing 08/14/2019   Ductal carcinoma in situ (DCIS) of right breast 06/15/2019   Arthralgia 04/11/2019   Malignant tumor of breast (HCC) 03/06/2019   Fatigue 03/06/2019   DDD (degenerative disc disease), cervical 11/19/2017   Peripheral edema 05/19/2017   Microcytic anemia 05/14/2017   Delayed sleep phase syndrome 10/29/2016   Primary osteoarthritis of left ankle 09/24/2016   Gastrointestinal food sensitivity 05/21/2016   History of arthroplasty of right shoulder 02/24/2016   Bilateral foot pain 01/22/2016   NASH (nonalcoholic steatohepatitis) 05/26/2015    Class: Stage 3   Nasal septal perforation 07/11/2014   Controlled diabetes mellitus type 2 with complications (HCC) 05/25/2014   Physical deconditioning 04/24/2014   Morbid obesity (HCC) 06/09/2013   OSA (obstructive sleep apnea) 06/08/2013   Chronic systolic heart failure (HCC) 05/23/2013   Asthma, moderate persistent 04/18/2013   Chronic cough 04/18/2013   Breast cancer of upper-inner quadrant of left female breast (HCC) 03/18/2010   Hyperlipidemia associated with type 2 diabetes mellitus (HCC) 11/07/2009   Fibromyalgia 10/29/2009    Biventricular cardiac pacemaker in situ 10/23/2009   Abnormal levels of other serum enzymes 10/13/2009   Hypothyroidism 10/03/2009   Essential hypertension 07/02/2009   Chronic back pain 07/02/2009   PCP:  [name], MD Pharmacy:   [facility] [phone] - [place], [place] - [address] AT [facility] OF [address] [place] [address] Phone: [phone] Fax: [phone]  [facility]) - [place], [place] - [address] [facility] [address] [place] [address] [place] [address] Phone: [phone] Fax: [phone]  [facility] - [place], [place] - [address] [place] [address] Phone: [phone] Fax: [phone]     Social Drivers of Health (SDOH) Social History: SDOH Screenings   Food Insecurity: No Food Insecurity (07/13/2024)  Housing: Low Risk  (07/13/2024)  Transportation Needs: No Transportation Needs (07/13/2024)  Recent Concern: Transportation Needs - Unmet Transportation Needs (07/07/2024)   Received from [facility]  Utilities: Not At Risk (07/13/2024)  Recent Concern: Utilities - At Risk (07/07/2024)   Received from [facility]  Alcohol  Screen: Low Risk  (08/10/2023)  Depression (PHQ2-9): Low Risk  (07/03/2024)  Financial Resource Strain: Low Risk  (07/07/2024)   Received from [facility]  Physical Activity: Sufficiently Active (08/10/2023)  Recent Concern:  Physical Activity - Insufficiently Active (07/12/2023)  Social Connections: Moderately Integrated (07/13/2024)  Stress: No Stress Concern Present (06/22/2024)   Received from [facility]  Tobacco Use: Medium Risk (07/13/2024)  Health Literacy: Adequate Health Literacy (08/10/2023)   SDOH Interventions:     Readmission Risk Interventions    07/14/2024   11:11 AM  Readmission Risk Prevention Plan  Transportation Screening Complete  PCP or Specialist Appt  within 3-5 Days Complete  HRI or Home Care Consult Complete  Palliative Care Screening Not Applicable  Medication Review (RN Care Manager) Complete

## 2024-07-14 NOTE — Progress Notes (Signed)
 Heart Failure Navigator Progress Note  Assessed for Heart & Vascular TOC clinic readiness.  Patient does not meet criteria due to she is a patient with the Advanced Heart Failure team. .   Navigator will sign  off at this time.   Stephane Haddock, BSN, Scientist, Clinical (histocompatibility And Immunogenetics) Only

## 2024-07-14 NOTE — Plan of Care (Signed)

## 2024-07-14 NOTE — Progress Notes (Signed)
 HIDA negative for cholecystitis. CCS will not follow acutely. Please call as needed.    Almarie Pringle, PA-C [facility] Please see Amion for pager number during day hours 7:00am-4:30pm

## 2024-07-15 DIAGNOSIS — H00013 Hordeolum externum right eye, unspecified eyelid: Secondary | ICD-10-CM | POA: Insufficient documentation

## 2024-07-15 DIAGNOSIS — R0602 Shortness of breath: Secondary | ICD-10-CM | POA: Diagnosis not present

## 2024-07-15 DIAGNOSIS — I5023 Acute on chronic systolic (congestive) heart failure: Secondary | ICD-10-CM | POA: Diagnosis not present

## 2024-07-15 LAB — CBC WITH DIFFERENTIAL/PLATELET
Abs Immature Granulocytes: 0.02 K/uL (ref 0.00–0.07)
Basophils Absolute: 0.1 K/uL (ref 0.0–0.1)
Basophils Relative: 1 %
Eosinophils Absolute: 0.2 K/uL (ref 0.0–0.5)
Eosinophils Relative: 3 %
HCT: 34.9 % — ABNORMAL LOW (ref 36.0–46.0)
Hemoglobin: 11.1 g/dL — ABNORMAL LOW (ref 12.0–15.0)
Immature Granulocytes: 0 %
Lymphocytes Relative: 25 %
Lymphs Abs: 1.4 K/uL (ref 0.7–4.0)
MCH: 31.9 pg (ref 26.0–34.0)
MCHC: 31.8 g/dL (ref 30.0–36.0)
MCV: 100.3 fL — ABNORMAL HIGH (ref 80.0–100.0)
Monocytes Absolute: 0.5 K/uL (ref 0.1–1.0)
Monocytes Relative: 9 %
Neutro Abs: 3.5 K/uL (ref 1.7–7.7)
Neutrophils Relative %: 62 %
Platelets: 208 K/uL (ref 150–400)
RBC: 3.48 MIL/uL — ABNORMAL LOW (ref 3.87–5.11)
RDW: 14.6 % (ref 11.5–15.5)
WBC: 5.7 K/uL (ref 4.0–10.5)
nRBC: 0 % (ref 0.0–0.2)

## 2024-07-15 LAB — BASIC METABOLIC PANEL WITH GFR
Anion gap: 11 (ref 5–15)
BUN: 8 mg/dL (ref 8–23)
CO2: 25 mmol/L (ref 22–32)
Calcium: 9.1 mg/dL (ref 8.9–10.3)
Chloride: 104 mmol/L (ref 98–111)
Creatinine, Ser: 1.08 mg/dL — ABNORMAL HIGH (ref 0.44–1.00)
GFR, Estimated: 54 mL/min — ABNORMAL LOW (ref >60)
Glucose, Bld: 81 mg/dL (ref 70–99)
Potassium: 4.1 mmol/L (ref 3.5–5.1)
Sodium: 140 mmol/L (ref 135–145)

## 2024-07-15 LAB — GLUCOSE, CAPILLARY: Glucose-Capillary: 115 mg/dL — ABNORMAL HIGH (ref 70–99)

## 2024-07-15 LAB — MAGNESIUM: Magnesium: 2.1 mg/dL (ref 1.7–2.4)

## 2024-07-15 LAB — MRSA NEXT GEN BY PCR, NASAL: MRSA by PCR Next Gen: DETECTED — AB

## 2024-07-15 NOTE — Discharge Summary (Signed)
 Physician Discharge Summary   Tanya Hogan FMW:[id number] DOB: July 05, 1951 DOA: 07/13/2024  PCP: Tanya Dorothyann BIRCH, MD  Admit date: 07/13/2024 Discharge date: 07/15/2024  Admitted From: Home Disposition:  Home Discharging physician: Tanya Apo, MD Barriers to discharge: none  Recommendations at discharge: Follow-up with neurology as planned for repeat CT head Follow-up with ophthalmology as referral already planned Continue following chronically with cardiology  Discharge Condition: stable CODE STATUS: Full Diet recommendation:  Diet Orders (From admission, onward)     Start     Ordered   07/15/24 0931  Diet regular Fluid consistency: Thin  Diet effective now       Question:  Fluid consistency:  Answer:  Thin   07/15/24 0930   07/15/24 0000  Diet general        07/15/24 1314            Hospital Course: [name] is a 73 yo female with PMH HFrEF (EF 20-25%), HTN, pulmonary HTN, HLD, OSA, NASH, GERD, IBS-C, hypothyroidism, DM II, depression, fibromyalgia, breast cancer s/p mastectomy, Ventricular tachyarrhythmia s/p CRT-D (05/31/24). She is a poor historian but overall able to state that she presents mostly due to shortness of breath.  With further questioning, she states that she has felt short of breath due to abdominal distention and difficulty with taking a big breath due to this.  She has also noticed worsening lower extremity swelling recently.  She endorsed recently finishing a course of antibiotics for diverticulitis but when asked when course was completed, she was unable to state. With chart review the CT showing diverticulitis was 05/07/2024 and care everywhere and she was discharged on 05/10/2024 with ciprofloxacin  and Flagyl  to finish 4 more days at discharge.  Unclear if she has really been having diarrhea for that long or if more recent.  She does endorse actual abdominal pain also with questioning which appears to be more localized to the RUQ.  Multiple  recent hospitalizations briefly summarized below also:  1) Most recent was 10/30/ - 10/31 at [facility] center for vision changes described as floaters.  She had CT head which was negative and CT angio head/neck also negative.  MRI brain unable to be performed due to pacemaker incompatibility.  Her symptoms improved and she was discharged home. She followed up with neurology on 07/07/2024 and was recommended to have repeat CT head at that time to pick up on any possible stroke that may have been present.  She also has upcoming appointment with ophthalmology on 08/11/2024.  Repeat CT head has not yet been performed since neurology visit on 11/14.  2) 9/19 - 10/1 for palpitations and VT  3) 10/13 until 10/15 due to ventricular tachycardia.  She was evaluated by cardiology and EP.  She underwent dosage increase of quinidine.  In the ER, initial concern was for potential CHF exacerbation due to her SOB and LE edema. BNP elevated higher than prior values, 1,523.  CTA chest negative for PE. Small pericardial effusion, mild interstitial edema.  Has missed some doses of spironolactone  at home recently but compliant on torsemide  she says. Has not taken any extra doses.  Believes she has gained about 8 pounds recently.   With more workup, due to RUQ pain, underwent CT A/P which showed moderate gallbladder wall thickening with cholelithiasis concerning for potential acute cholecystitis.  Diverticulosis noted with no further inflammation. General surgery was consulted for further assistance with the gallbladder workup.  She was afebrile with no leukocytosis. No antibiotics were  initiated.   Assessment & Plan:  SOB LE edema Acute on chronic combined systolic and diastolic CHF - potentially mild CHF exacerbation given weight gain, LE edema, SOB (but may be confounded by abd pain limiting breathing and body habitus) - Mild interstitial edema on CTA chest, small pericardial effusion - BNP  1,523 higher than prior values - last echo 05/13/24: EF 20-25%, Gr 2 DD  - Endorsed missing a few doses recently of spironolactone  but has been compliant on torsemide .  Approximately 8 pound weight gain - allergy noted to lasix ; trial given in ER, patient did not like nor want anymore - has tolerated IV bumex  in past, will try that next for diuresis and likely involve cardiology if HIDA positive, otherwise, outpt follow up should be okay  - continue bumex  2 mg daily -Diuresed well and transitioned back to home regimen at discharge  Abdominal pain-resolved - appears to be more localized to RUQ - CT A/P negative for diverticulitis; shows GB wall thickening with cholelithiasis - General Surgery consulted on admission and recommending HIDA scan and holding off on antibiotics for now - Nontoxic-appearing and afebrile with no leukocytosis - HIDA scan also negative - Patient tolerated diet advancement and stable for discharge home  Diarrhea -resolved - unclear duration and patient very poor historian  - last abx cipro /flagyl  completed ~ 9/21 - check cdiff and GI panel; patient had no further diarrhea after admission and therefore no stool testing able to be performed  Hx monomorphic VT - followed by EP - recent upgrade in October 2025 of [employer] CRT-P to CRT-D and addition of RV lead  - Continue amiodarone  and quinidine (latter recently increased also) - Continue Coreg   NASH Elevated LFTs - resume statin at discharge; LFTs improved  - pattern not consistent with obstruction   HTN - continue coreg   DMII - 5.5% on 06/05/24 - continue diet control    COPD No signs of exacerbation  - continue breathing treatments as needed   Obesity - Complicates overall prognosis and care - Body mass index is 35.82 kg/m.   Hypothyroidism Continue with levothyroxine    The patient's acute and chronic medical conditions were treated accordingly. On day of discharge, patient was felt deemed  stable for discharge. Patient/family member advised to call PCP or come back to ER if needed.   Principal Diagnosis: SOB (shortness of breath)  Discharge Diagnoses: Active Hospital Problems   Diagnosis Date Noted   SOB (shortness of breath) 07/13/2024   Acute on chronic systolic CHF (congestive heart failure) (HCC) 10/29/2022    Priority: 1.   Essential hypertension 07/02/2009    Priority: 3.   NASH (nonalcoholic steatohepatitis) 05/26/2015    Priority: 5.    Class: Stage 3   COPD (chronic obstructive pulmonary disease) (HCC) 09/21/2021    Priority: 6.   Hypothyroidism 10/03/2009    Priority: 8.   Hordeolum externum of right eye 07/15/2024    Resolved Hospital Problems  No resolved problems to display.     Discharge Instructions     Diet general   Complete by: As directed    Increase activity slowly   Complete by: As directed       Allergies as of 07/15/2024       Reactions   Injectafer [ferric Carboxymaltose] Shortness Of Breath   Mild SOB following injectafer infusion   Penicillins Anaphylaxis, Rash   Accupril [quinapril Hcl] Other (See Comments)   Hyperkalemia=high potassium level   Celebrex [celecoxib] Other (See Comments)  Hx hematemesis, bleeding ulcer   Cozaar  [losartan  Potassium] Itching   Dml Forte Rash   Entresto [sacubitril-valsartan] Other (See Comments)   Hyperkalemia   Flonase  [fluticasone ] Other (See Comments)   Hx nasal ulcer   Lactose Intolerance (gi) Diarrhea   Lipitor [atorvastatin ] Other (See Comments)   Myalgias    Nasonex  [mometasone  Furoate] Other (See Comments)   Epistaxis    Nsaids Other (See Comments)   Hx hematemesis, bleeding ulcer   Advil [ibuprofen] Nausea And Vomiting   Hx hematemesis, bleeding ulcer   Baking Soda-fluoride [sodium Fluoride] Itching, Rash   Bayer Aspirin  [aspirin ] Other (See Comments)   Hx hematemesis, bleeding ulcer   Chamomile Hives   Clindamycin /lincomycin Rash   Codeine Nausea And Vomiting   Lasix   [furosemide ] Other (See Comments)   Tinnitus  Hypotension   Miralax  [polyethylene Glycol (macrogol)] Other (See Comments)   Leaky gut syndrome   Quinine Rash   Reclast  [zoledronic  Acid] Rash, Other (See Comments)   Tremors    Rifadin [rifampin] Other (See Comments)   Unknown reaction   Singulair  [montelukast ] Other (See Comments)   Dizziness    Sulfa Antibiotics Rash   Ultram  [tramadol ] Anxiety, Rash   Vesicare  [solifenacin ] Other (See Comments)   Urinary retention   Wellbutrin  [bupropion ] Other (See Comments)   Myalgias  Skin crawling sensation   Wool Alcohol  [lanolin] Rash        Medication List     TAKE these medications    albuterol  (2.5 MG/3ML) 0.083% nebulizer solution Commonly known as: PROVENTIL  Take 2.5 mg by nebulization every 6 (six) hours as needed for wheezing or shortness of breath.   albuterol  108 (90 Base) MCG/ACT inhaler Commonly known as: VENTOLIN  HFA Inhale 2 puffs into the lungs every 6 (six) hours as needed for wheezing or shortness of breath.   alendronate  70 MG tablet Commonly known as: FOSAMAX  TAKE 1 TABLET BY MOUTH EVERY 7  DAYS WITH FULL GLASS OF WATER  ON AN EMPTY STOMACH What changed: See the new instructions.   amiodarone  200 MG tablet Commonly known as: PACERONE  Take 1 tablet (200 mg total) by mouth daily.   aspirin  81 MG chewable tablet Chew 81 mg by mouth daily.   Calcium  600+D Plus Minerals 600-400 MG-UNIT Tabs 1 tab p.o. twice daily   carvedilol  3.125 MG tablet Commonly known as: COREG  Take 3.125 mg by mouth 2 (two) times daily.   EPINEPHrine  0.3 mg/0.3 mL Soaj injection Commonly known as: EPI-PEN Inject 0.3 mg into the muscle as needed for anaphylaxis.   estradiol 0.1 MG/GM vaginal cream Commonly known as: ESTRACE Place 1 Applicatorful vaginally 3 (three) times a week.   fexofenadine  180 MG tablet Commonly known as: ALLEGRA  Take 180 mg by mouth daily.   FreeStyle Libre 3 Reader [employer] Use for continuous glucose  monitoring   FreeStyle Libre 3 Sensor Misc by Does not apply route. Change every 14 days   Gemtesa 75 MG Tabs Generic drug: Vibegron Take 75 mg by mouth daily.   glucose blood test strip To be used to check blood glucose Dx:E11.8 FreeStyle Precision blood glucose for Freestyle Libre 2   isosorbide  mononitrate 30 MG 24 hr tablet Commonly known as: IMDUR  TAKE 1 TABLET BY MOUTH DAILY   levothyroxine  112 MCG tablet Commonly known as: SYNTHROID  TAKE 1 TABLET BY MOUTH DAILY   lubiprostone  24 MCG capsule Commonly known as: AMITIZA  Take 24 mcg by mouth 2 (two) times daily with a meal. Patient reports provider has decreased to every three  days.   metFORMIN  500 MG 24 hr tablet Commonly known as: GLUCOPHAGE -XR TAKE 1 TABLET BY MOUTH TWICE  DAILY WITH MEALS   metoprolol  succinate 50 MG 24 hr tablet Commonly known as: TOPROL -XL Take 1 tablet (50 mg total) by mouth 2 (two) times daily. Take with or immediately following a meal.   Multivitamin Women 50+ Tabs Take 1 tablet by mouth daily.   pantoprazole  40 MG tablet Commonly known as: PROTONIX  TAKE 1 TABLET BY MOUTH DAILY  BEFORE BREAKFAST What changed: when to take this   pravastatin  40 MG tablet Commonly known as: PRAVACHOL  Take 40 mg by mouth daily. What changed:  how much to take when to take this   quiNIDine sulfate  200 MG tablet Take 1 tablet (200 mg total) by mouth 3 (three) times daily.   Spiriva  Respimat 1.25 MCG/ACT Aers Generic drug: Tiotropium Bromide  Inhale 1 puff into the lungs daily.   Spiriva  Respimat 2.5 MCG/ACT Aers Generic drug: Tiotropium Bromide  Inhale 2 puffs into the lungs daily.   spironolactone  25 MG tablet Commonly known as: ALDACTONE  Take 0.5 tablets (12.5 mg total) by mouth daily.   torsemide  20 MG tablet Commonly known as: DEMADEX  TAKE 1 TABLET BY MOUTH DAILY   valACYclovir  1000 MG tablet Commonly known as: VALTREX  Take 1 tablet (1,000 mg total) by mouth 2 (two) times daily. What  changed:  when to take this reasons to take this   Wixela Inhub  250-50 MCG/ACT Aepb Generic drug: fluticasone -salmeterol Inhale 1 puff into the lungs in the morning and at bedtime.        Contact information for follow-up providers     [facility]) Follow up.   Specialty: Home Health Services Why: Agency will call you to set up apt times Contact information: [address] [place]  [address] [phone]        Tanya Dorothyann BIRCH, MD. Schedule an appointment as soon as possible for a visit in 1 week(s).   Specialty: Family Medicine Contact information: [address] [place] [address] [phone]              Contact information for after-discharge care     Home Medical Care     [facility]) .   Service: Home Health Services Contact information: [address] [place]  [address] [phone]                    Allergies  Allergen Reactions   Injectafer [Ferric Carboxymaltose] Shortness Of Breath    Mild SOB following injectafer infusion    Penicillins Anaphylaxis and Rash   Accupril [Quinapril Hcl] Other (See Comments)    Hyperkalemia=high potassium level   Celebrex [Celecoxib] Other (See Comments)    Hx hematemesis, bleeding ulcer   Cozaar  [Losartan  Potassium] Itching   Dml Forte Rash   Entresto [Sacubitril-Valsartan] Other (See Comments)    Hyperkalemia   Flonase  [Fluticasone ] Other (See Comments)    Hx nasal ulcer   Lactose Intolerance (Gi) Diarrhea   Lipitor [Atorvastatin ] Other (See Comments)    Myalgias    Nasonex  [Mometasone  Furoate] Other (See Comments)    Epistaxis    Nsaids Other (See Comments)    Hx hematemesis, bleeding ulcer   Advil [Ibuprofen] Nausea And Vomiting    Hx hematemesis, bleeding ulcer   Baking Soda-Fluoride [Sodium Fluoride] Itching and Rash   Bayer Aspirin   [Aspirin ] Other (  See Comments)    Hx hematemesis, bleeding ulcer   Chamomile Hives   Clindamycin /Lincomycin Rash   Codeine Nausea And Vomiting   Lasix  [Furosemide ] Other (See Comments)    Tinnitus  Hypotension   Miralax  [Polyethylene Glycol (Macrogol)] Other (See Comments)    Leaky gut syndrome   Quinine Rash   Reclast  [Zoledronic  Acid] Rash and Other (See Comments)    Tremors    Rifadin [Rifampin] Other (See Comments)    Unknown reaction   Singulair  [Montelukast ] Other (See Comments)    Dizziness    Sulfa Antibiotics Rash   Ultram  [Tramadol ] Anxiety and Rash   Vesicare  [Solifenacin ] Other (See Comments)    Urinary retention   Wellbutrin  [Bupropion ] Other (See Comments)    Myalgias  Skin crawling sensation   Wool Alcohol  [Lanolin] Rash    Consultations:   Procedures:   Discharge Exam: BP 114/75 (BP Location: Right Arm)   Pulse 89   Temp 98.2 F (36.8 C) (Oral)   Resp 20   Ht 5' 1 (1.549 m)   Wt 86 kg   SpO2 95%   BMI 35.82 kg/m  Physical Exam Constitutional:      Appearance: Normal appearance.  HENT:     Head: Normocephalic and atraumatic.     Mouth/Throat:     Mouth: Mucous membranes are moist.  Eyes:     General:        Right eye: Hordeolum present.     Extraocular Movements: Extraocular movements intact.  Cardiovascular:     Rate and Rhythm: Normal rate and regular rhythm.  Pulmonary:     Effort: Pulmonary effort is normal. No respiratory distress.     Breath sounds: Normal breath sounds.  Abdominal:     General: Bowel sounds are normal.     Palpations: Abdomen is soft.     Tenderness: There is no abdominal tenderness.  Musculoskeletal:        General: Normal range of motion.     Cervical back: Normal range of motion and neck supple.     Right lower leg: Edema (1-2+) present.     Left lower leg: Edema (1-2+) present.  Skin:    General: Skin is warm and dry.  Neurological:     General: No focal deficit present.     Mental Status: She is  alert.  Psychiatric:        Mood and Affect: Mood normal.      The results of significant diagnostics from this hospitalization (including imaging, microbiology, ancillary and laboratory) are listed below for reference.   Microbiology: Recent Results (from the past 240 hours)  Resp panel by RT-PCR (RSV, Flu A&B, Covid) Anterior Nasal Swab     Status: None   Collection Time: 07/13/24  2:04 PM   Specimen: Anterior Nasal Swab  Result Value Ref Range Status   SARS Coronavirus 2 by RT PCR NEGATIVE NEGATIVE Final   Influenza A by PCR NEGATIVE NEGATIVE Final   Influenza B by PCR NEGATIVE NEGATIVE Final    Comment: (NOTE) The Xpert Xpress SARS-CoV-2/FLU/RSV plus assay is intended as an aid in the diagnosis of influenza from Nasopharyngeal swab specimens and should not be used as a sole basis for treatment. Nasal washings and aspirates are unacceptable for Xpert Xpress SARS-CoV-2/FLU/RSV testing.  Fact Sheet for Patients: bloggercourse.com  Fact Sheet for Healthcare Providers: seriousbroker.it  This test is not yet approved or cleared by the [place]  FDA and has been authorized for detection and/or diagnosis of SARS-CoV-2  by FDA under an Emergency Use Authorization (EUA). This EUA will remain in effect (meaning this test can be used) for the duration of the COVID-19 declaration under Section 564(b)(1) of the Act, 21 U.S.C. section 360bbb-3(b)(1), unless the authorization is terminated or revoked.     Resp Syncytial Virus by PCR NEGATIVE NEGATIVE Final    Comment: (NOTE) Fact Sheet for Patients: bloggercourse.com  Fact Sheet for Healthcare Providers: seriousbroker.it  This test is not yet approved or cleared by the [place]  FDA and has been authorized for detection and/or diagnosis of SARS-CoV-2 by FDA under an Emergency Use Authorization (EUA). This EUA will remain in  effect (meaning this test can be used) for the duration of the COVID-19 declaration under Section 564(b)(1) of the Act, 21 U.S.C. section 360bbb-3(b)(1), unless the authorization is terminated or revoked.  Performed at [facility], [address], [place], [place] [address]   Urine Culture     Status: Abnormal   Collection Time: 07/13/24  2:35 PM   Specimen: Urine, Random  Result Value Ref Range Status   Specimen Description URINE, RANDOM  Final   Special Requests   Final    NONE Reflexed from [id number] Performed at [facility], [address] [place], [place] [address]    Culture (A)  Final    80,000 COLONIES/mL MULTIPLE SPECIES PRESENT, SUGGEST RECOLLECTION   Report Status 07/14/2024 FINAL  Final  MRSA Next Gen by PCR, Nasal     Status: Abnormal   Collection Time: 07/13/24 11:23 PM   Specimen: Nasal Mucosa; Nasal Swab  Result Value Ref Range Status   MRSA by PCR Next Gen DETECTED (A) NOT DETECTED Final    Comment: (NOTE) The GeneXpert MRSA Assay (FDA approved for NASAL specimens only), is one component of a comprehensive MRSA colonization surveillance program. It is not intended to diagnose MRSA infection nor to guide or monitor treatment for MRSA infections. Test performance is not FDA approved in patients less than 49 years old. Performed at [facility], [address] [place], [place] [address]      Labs: BNP (last 3 results) Recent Labs    12/20/23 1443 06/05/24 1024 07/13/24 1430  BNP 459.4* 1,051.8* 1,523.2*   Basic Metabolic Panel: Recent Labs  Lab 07/13/24 1430 07/13/24 1444 07/14/24 0218 07/15/24 0211  NA 135 138 136 140  K 4.9 4.7 3.7 4.1  CL 102 104 103 104  CO2 23  --  24 25  GLUCOSE 111* 111* 100* 81  BUN 20 21 14 8   CREATININE 1.06* 1.10* 0.86 1.08*  CALCIUM  9.7  --  9.0 9.1  MG 2.4  --  2.0 2.1   Liver Function Tests: Recent Labs  Lab 07/13/24 1430 07/14/24 0218  AST 61* 39  ALT 37 31  ALKPHOS 144* 121  BILITOT  0.9 0.9  PROT 7.0 6.2*  ALBUMIN 3.3* 3.0*   Recent Labs  Lab 07/13/24 1430  LIPASE 28   No results for input(s): AMMONIA in the last 168 hours. CBC: Recent Labs  Lab 07/13/24 1430 07/13/24 1444 07/14/24 0218 07/15/24 0211  WBC 7.6  --  6.1 5.7  NEUTROABS 5.8  --  4.0 3.5  HGB 12.0 12.6 11.5* 11.1*  HCT 37.8 37.0 35.9* 34.9*  MCV 101.1*  --  99.7 100.3*  PLT 235  --  207 208   Cardiac Enzymes: No results for input(s): CKTOTAL, CKMB, CKMBINDEX, TROPONINI in the last 168 hours. BNP: Invalid input(s): POCBNP  CBG: Recent Labs  Lab 07/15/24 0934  GLUCAP 115*   D-Dimer No results for input(s): DDIMER in the last 72 hours. Hgb A1c No results for input(s): HGBA1C in the last 72 hours. Lipid Profile No results for input(s): CHOL, HDL, LDLCALC, TRIG, CHOLHDL, LDLDIRECT in the last 72 hours. Thyroid  function studies Recent Labs    07/13/24 1430  TSH 3.447   Anemia work up No results for input(s): VITAMINB12, FOLATE, FERRITIN, TIBC, IRON, RETICCTPCT in the last 72 hours. Urinalysis    Component Value Date/Time   COLORURINE YELLOW 07/13/2024 1435   APPEARANCEUR CLOUDY (A) 07/13/2024 1435   APPEARANCEUR Clear 12/20/2023 1443   LABSPEC 1.020 07/13/2024 1435   PHURINE 5.0 07/13/2024 1435   GLUCOSEU NEGATIVE 07/13/2024 1435   HGBUR NEGATIVE 07/13/2024 1435   BILIRUBINUR NEGATIVE 07/13/2024 1435   BILIRUBINUR Negative 12/20/2023 1443   KETONESUR NEGATIVE 07/13/2024 1435   PROTEINUR 100 (A) 07/13/2024 1435   UROBILINOGEN 0.2 11/23/2023 1334   UROBILINOGEN 0.2 04/18/2010 1340   NITRITE NEGATIVE 07/13/2024 1435   LEUKOCYTESUR MODERATE (A) 07/13/2024 1435   Sepsis Labs Recent Labs  Lab 07/13/24 1430 07/14/24 0218 07/15/24 0211  WBC 7.6 6.1 5.7   Microbiology Recent Results (from the past 240 hours)  Resp panel by RT-PCR (RSV, Flu A&B, Covid) Anterior Nasal Swab     Status: None   Collection Time: 07/13/24  2:04 PM    Specimen: Anterior Nasal Swab  Result Value Ref Range Status   SARS Coronavirus 2 by RT PCR NEGATIVE NEGATIVE Final   Influenza A by PCR NEGATIVE NEGATIVE Final   Influenza B by PCR NEGATIVE NEGATIVE Final    Comment: (NOTE) The Xpert Xpress SARS-CoV-2/FLU/RSV plus assay is intended as an aid in the diagnosis of influenza from Nasopharyngeal swab specimens and should not be used as a sole basis for treatment. Nasal washings and aspirates are unacceptable for Xpert Xpress SARS-CoV-2/FLU/RSV testing.  Fact Sheet for Patients: bloggercourse.com  Fact Sheet for Healthcare Providers: seriousbroker.it  This test is not yet approved or cleared by the [place]  FDA and has been authorized for detection and/or diagnosis of SARS-CoV-2 by FDA under an Emergency Use Authorization (EUA). This EUA will remain in effect (meaning this test can be used) for the duration of the COVID-19 declaration under Section 564(b)(1) of the Act, 21 U.S.C. section 360bbb-3(b)(1), unless the authorization is terminated or revoked.     Resp Syncytial Virus by PCR NEGATIVE NEGATIVE Final    Comment: (NOTE) Fact Sheet for Patients: bloggercourse.com  Fact Sheet for Healthcare Providers: seriousbroker.it  This test is not yet approved or cleared by the [place]  FDA and has been authorized for detection and/or diagnosis of SARS-CoV-2 by FDA under an Emergency Use Authorization (EUA). This EUA will remain in effect (meaning this test can be used) for the duration of the COVID-19 declaration under Section 564(b)(1) of the Act, 21 U.S.C. section 360bbb-3(b)(1), unless the authorization is terminated or revoked.  Performed at [facility], [address] [place], [place] [address]   Urine Culture     Status: Abnormal   Collection Time: 07/13/24  2:35 PM   Specimen: Urine, Random  Result Value Ref  Range Status   Specimen Description URINE, RANDOM  Final   Special Requests   Final    NONE Reflexed from [id number] Performed at [facility], [address] [place], [place] [address]    Culture (A)  Final    80,000 COLONIES/mL MULTIPLE SPECIES  PRESENT, SUGGEST RECOLLECTION   Report Status 07/14/2024 FINAL  Final  MRSA Next Gen by PCR, Nasal     Status: Abnormal   Collection Time: 07/13/24 11:23 PM   Specimen: Nasal Mucosa; Nasal Swab  Result Value Ref Range Status   MRSA by PCR Next Gen DETECTED (A) NOT DETECTED Final    Comment: (NOTE) The GeneXpert MRSA Assay (FDA approved for NASAL specimens only), is one component of a comprehensive MRSA colonization surveillance program. It is not intended to diagnose MRSA infection nor to guide or monitor treatment for MRSA infections. Test performance is not FDA approved in patients less than 61 years old. Performed at [facility], [address] [place], [place] [address]     Procedures/Studies: NM Hepatobiliary Liver Func Result Date: 07/14/2024 EXAM: NM HEPATOBILLARY SCAN 07/14/2024 01:26:50 PM TECHNIQUE: RADIOPHARMACEUTICAL: 5.26 mCi Tc-92m mebrofenin  Dynamic images of the abdomen and pelvis were obtained in the anterior projection for 1 hour after intravenous administration of radiopharmaceutical. COMPARISON: None available. CLINICAL HISTORY: Cholelithiasis. FINDINGS: Homogenous uptake within the liver. Normal clearance of the blood pool. Appropriate excretion into the biliary system. Gallbladder begins to fill at 20 minutes. Activity is evident in the small bowel by 40 minutes. Findings consistent with patent cystic duct and common bile duct. IMPRESSION: 1. No scintigraphic evidence of acute cholecystitis. Electronically signed by: Norleen Boxer MD 07/14/2024 02:54 PM EST RP Workstation: [id number]   CT Angio Chest PE W and/or Wo Contrast Result Date: 07/13/2024 CLINICAL DATA:  High probability for PE.  CHF exacerbation.  EXAM: CT ANGIOGRAPHY CHEST WITH CONTRAST TECHNIQUE: Multidetector CT imaging of the chest was performed using the standard protocol during bolus administration of intravenous contrast. Multiplanar CT image reconstructions and MIPs were obtained to evaluate the vascular anatomy. RADIATION DOSE REDUCTION: This exam was performed according to the departmental dose-optimization program which includes automated exposure control, adjustment of the mA and/or kV according to patient size and/or use of iterative reconstruction technique. CONTRAST:  75mL OMNIPAQUE  IOHEXOL  350 MG/ML SOLN COMPARISON:  CT of the chest 04/23/2023. FINDINGS: Cardiovascular: Aorta is normal in size. Heart is enlarged. Left-sided pacemaker is present. There is a small pericardial effusion. There is adequate opacification of the pulmonary arteries to the segmental level. No pulmonary embolism identified. Mediastinum/Nodes: There are nonenlarged mediastinal lymph nodes diffusely, increased in size and number compared to prior study. Mildly enlarged right hilar lymph nodes have increased from prior measuring up to 10 mm. Lungs/Pleura: There is no pleural effusion or pneumothorax. There some smooth interlobular septal thickening in the lung bases with mild ground-glass opacities. There is a band of atelectasis in the left lower lobe. Upper Abdomen: No acute abnormality. Musculoskeletal: Right mastectomy changes are present. There are calcifications in the left breast. Right shoulder arthroplasty is present. Review of the MIP images confirms the above findings. IMPRESSION: 1. No evidence for pulmonary embolism. 2. Cardiomegaly with small pericardial effusion. 3. Mild interstitial edema. 4. Mild mediastinal and right hilar lymphadenopathy, increased from prior. This may be reactive. Electronically Signed   By: Greig Pique M.D.   On: 07/13/2024 15:43   CT ABDOMEN PELVIS W CONTRAST Result Date: 07/13/2024 EXAM: CT ABDOMEN AND PELVIS WITH CONTRAST  07/13/2024 03:19:47 PM TECHNIQUE: CT of the abdomen and pelvis was performed with the administration of 75 mL of iohexol  (OMNIPAQUE ) 350 MG/ML injection. Multiplanar reformatted images are provided for review. Automated exposure control, iterative reconstruction, and/or weight-based adjustment of the mA/kV was utilized to reduce the radiation dose to as  low as reasonably achievable. COMPARISON: 11/29/2023 CLINICAL HISTORY: Abdominal pain, acute, nonlocalized; Upper abdominal pain and chest pain, pleuritic symptoms. Shortness of breath. Cannot lay flat. Concern for heart failure or other intra-abdominal pathology versus PE versus fluid overload/pneumonia. FINDINGS: LOWER CHEST: Mild septal thickening seen in visualized lung bases concerning for edema. LIVER: The liver is unremarkable. GALLBLADDER AND BILE DUCTS: Moderate gallbladder wall thickening is noted with cholelithiasis concerning for possible cholecystitis. Ultrasound is recommended for further evaluation. No biliary ductal dilatation. SPLEEN: No acute abnormality. PANCREAS: No acute abnormality. ADRENAL GLANDS: No acute abnormality. KIDNEYS, URETERS AND BLADDER: No stones in the kidneys or ureters. No hydronephrosis. No perinephric or periureteral stranding. Urinary bladder is unremarkable. GI AND BOWEL: Stomach demonstrates no acute abnormality. There is no bowel obstruction. Diverticulosis of descending and sigmoid colon is noted without inflammation. PERITONEUM AND RETROPERITONEUM: No ascites. No free air. VASCULATURE: Aorta is normal in caliber. LYMPH NODES: No lymphadenopathy. REPRODUCTIVE ORGANS: Status post hysterectomy. BONES AND SOFT TISSUES: No acute osseous abnormality. No focal soft tissue abnormality. IMPRESSION: 1. Moderate gallbladder wall thickening with cholelithiasis, suspicious for acute cholecystitis; ultrasound is recommended for further evaluation. 2. Mild septal thickening in the visualized lung bases, suspicious for edema. 3.  Diverticulosis of the descending and sigmoid colon without inflammation. Electronically signed by: Lynwood Seip MD 07/13/2024 03:36 PM EST RP Workstation: [id number]     Time coordinating discharge: Over 30 minutes    Tanya Apo, MD  [facility] 07/15/2024, 2:34 PM

## 2024-07-15 NOTE — Plan of Care (Signed)

## 2024-07-15 NOTE — Progress Notes (Signed)
 Discharge instructions provided by Duwaine, RN. Patient verbalizes understanding of all instructions and follow-up care. Patient discharged to home with all belongings via wheelchair by RN at 1701 on 07/15/24.

## 2024-07-15 NOTE — Plan of Care (Signed)
  Problem: Clinical Measurements: Goal: Cardiovascular complication will be avoided Outcome: Progressing   Problem: Elimination: Goal: Will not experience complications related to bowel motility Outcome: Progressing   Problem: Pain Managment: Goal: General experience of comfort will improve and/or be controlled Outcome: Progressing

## 2024-07-15 NOTE — TOC Transition Note (Signed)
 Transition of Care [facility]) - Discharge Note   Patient Details  Name: Tanya Hogan MRN: [id number] Date of Birth: 02-12-1951  [facility]) CM/SW Contact:  Marval Gell, RN Phone Number: 07/15/2024, 1:19 PM   Clinical Narrative:     ROS orders placed, requested MD to co sign, placed in HUB added to AVS   Final next level of care: Home w Home Health Services Barriers to Discharge: Continued Medical Work up   Patient Goals and CMS Choice Patient states their goals for this hospitalization and ongoing recovery are:: return home and continue [facility] CMS Medicare.gov Compare Post Acute Care list provided to:: Patient Choice offered to / list presented to : Patient      Discharge Placement                       Discharge Plan and Services Additional resources added to the After Visit Summary for     Discharge Planning Services: CM Consult Post Acute Care Choice: Home Health          DME Arranged: N/A DME Agency: NA, [facility]       HH Arranged: PT, RN HH Agency: [facility]        Social Drivers of Health (SDOH) Interventions SDOH Screenings   Food Insecurity: No Food Insecurity (07/13/2024)  Housing: Low Risk  (07/13/2024)  Transportation Needs: No Transportation Needs (07/13/2024)  Recent Concern: Transportation Needs - Unmet Transportation Needs (07/07/2024)   Received from [facility]  Utilities: Not At Risk (07/13/2024)  Recent Concern: Utilities - At Risk (07/07/2024)   Received from [facility]  Alcohol  Screen: Low Risk  (08/10/2023)  Depression (PHQ2-9): Low Risk  (07/03/2024)  Financial Resource Strain: Low Risk  (07/07/2024)   Received from [facility]  Physical Activity: Sufficiently Active (08/10/2023)  Recent Concern: Physical Activity - Insufficiently Active (07/12/2023)  Social Connections: Moderately Integrated (07/13/2024)  Stress: No Stress Concern Present (06/22/2024)   Received from [facility]  Tobacco Use:  Medium Risk (07/13/2024)  Health Literacy: Adequate Health Literacy (08/10/2023)     Readmission Risk Interventions    07/14/2024   11:11 AM  Readmission Risk Prevention Plan  Transportation Screening Complete  PCP or Specialist Appt within 3-5 Days Complete  HRI or Home Care Consult Complete  Palliative Care Screening Not Applicable  Medication Review (RN Care Manager) Complete

## 2024-07-15 NOTE — Progress Notes (Signed)
 Ted hose applied per order. Patient tolerated well and verbalizes understanding of use.

## 2024-07-15 NOTE — Plan of Care (Signed)

## 2024-07-17 ENCOUNTER — Telehealth: Payer: Self-pay

## 2024-07-17 DIAGNOSIS — R0602 Shortness of breath: Secondary | ICD-10-CM

## 2024-07-17 NOTE — Transitions of Care (Post Inpatient/ED Visit) (Signed)
 07/17/2024  Name: Tanya Hogan MRN: [id number] DOB: May 26, 1951  Today's TOC FU Call Status: Today's TOC FU Call Status:: Successful TOC FU Call Completed TOC FU Call Complete Date: 07/17/24  Patient's Name and Date of Birth confirmed. DOB, Name  Transition Care Management Follow-up Telephone Call How have you been since you were released from the hospital?: Better Any questions or concerns?: No  Items Reviewed: Did you receive and understand the discharge instructions provided?: Yes Medications obtained,verified, and reconciled?: Yes (Medications Reviewed) Any new allergies since your discharge?: No Dietary orders reviewed?: NA Do you have support at home?: Yes People in Home [RPT]: child(ren), adult Name of Support/Comfort Primary Source: daughter helps when she can - patient's brother with [employer] lives with patient  Medications Reviewed Today: Medications Reviewed Today     Reviewed by Lauro Shona LABOR, RN (Registered Nurse) on 07/17/24 at 1611  Med List Status: <None>   Medication Order Taking? Sig Documenting Provider Last Dose Status Informant  albuterol  (PROVENTIL ) (2.5 MG/3ML) 0.083% nebulizer solution [id number] Yes Take 2.5 mg by nebulization every 6 (six) hours as needed for wheezing or shortness of breath. [provider]  Active Self, Pharmacy Records  albuterol  (VENTOLIN  HFA) 108 (90 Base) MCG/ACT inhaler [id number] Yes Inhale 2 puffs into the lungs every 6 (six) hours as needed for wheezing or shortness of breath. [provider]  Active Pharmacy Records, Self  alendronate  (FOSAMAX ) 70 MG tablet [id number] Yes TAKE 1 TABLET BY MOUTH EVERY 7  DAYS WITH FULL GLASS OF WATER  ON AN EMPTY STOMACH  Patient taking differently: No sig reported   [name], MD  Active Self, Pharmacy Records           Med Note (COFFELL, JON HERO   Mon Jun 05, 2024  2:24 PM)    amiodarone  (PACERONE ) 200 MG tablet [id number] Yes Take 1 tablet (200 mg total) by  mouth daily. [name], MD  Active Self, Pharmacy Records  aspirin  81 MG chewable tablet [id number] Yes Chew 81 mg by mouth daily. [provider]  Active Self, Pharmacy Records  Calcium  Carbonate-Vit D-Min (CALCIUM  600+D PLUS MINERALS) 600-400 MG-UNIT TABS [id number] Yes 1 tab p.o. twice daily Curtis Debby PARAS, MD  Active Self, Pharmacy Records  carvedilol  (COREG ) 3.125 MG tablet [id number]  Take 3.125 mg by mouth 2 (two) times daily.  Patient not taking: Reported on 07/17/2024   [provider]  Active Self, Pharmacy Records  Continuous Glucose Receiver (FREESTYLE LIBRE 3 READER) [place]VI [id number] Yes Use for continuous glucose monitoring [name], MD  Active Self, Pharmacy Records  Continuous Glucose Sensor (FREESTYLE LIBRE 3 [place]) [place] [id number] Yes by Does not apply route. Change every 14 days [provider]  Active Self, Pharmacy Records           Med Note JACKOLYN, [place] R   Thu Dec 09, 2023 10:26 AM)    EPINEPHrine  0.3 mg/0.3 mL IJ SOAJ injection [id number] Yes Inject 0.3 mg into the muscle as needed for anaphylaxis. [name], MD  Active Self, Pharmacy Records           Med Note (COFFELL, JON HERO   Mon Jun 05, 2024  2:27 PM) Has available, never used.  estradiol (ESTRACE) 0.1 MG/GM vaginal cream [id number]  Place 1 Applicatorful vaginally 3 (three) times a week.  Patient not taking: Reported on 07/17/2024   [provider]  Active Self, Pharmacy Records  fexofenadine  (ALLEGRA ) 180 MG tablet [id number] Yes Take  180 mg by mouth daily. [provider]  Active Self, Pharmacy Records  fluticasone -salmeterol (WIXELA INHUB ) 250-50 MCG/ACT AEPB [id number] Yes Inhale 1 puff into the lungs in the morning and at bedtime. [provider]  Active Self, Pharmacy Records  GEMTESA 75 MG TABS [id number]  Take 75 mg by mouth daily.  Patient not taking: Reported on 07/17/2024   [provider]  Active Self,  Pharmacy Records  glucose blood test strip [id number] Yes To be used to check blood glucose Dx:E11.8 FreeStyle Precision blood glucose for Freestyle Libre 2 [name], MD  Active Self, Pharmacy Records  isosorbide  mononitrate (IMDUR ) 30 MG 24 hr tablet [id number] Yes TAKE 1 TABLET BY MOUTH DAILY Pietro Redell RAMAN, MD  Active Self, Pharmacy Records  levothyroxine  (SYNTHROID ) 112 MCG tablet [id number] Yes TAKE 1 TABLET BY MOUTH DAILY [name], MD  Active Self, Pharmacy Records  lubiprostone  (AMITIZA ) 24 MCG capsule [id number] Yes Take 24 mcg by mouth 2 (two) times daily with a meal. Patient reports provider has decreased to every three days. [provider]  Active Self, Pharmacy Records           Med Note (COFFELL, JON CHRISTELLA Kitchens Jun 05, 2024  2:35 PM) All Rx's directed to take 24mcg BID - patient states she is only taking 24mcg QD.  metFORMIN  (GLUCOPHAGE -XR) 500 MG 24 hr tablet [id number]  TAKE 1 TABLET BY MOUTH TWICE  DAILY WITH MEALS  Patient not taking: Reported on 07/17/2024   [name], MD  Active Self, Pharmacy Records           Med Note STEFFI, [place]   Sat May 13, 2024 10:38 AM) Patient is taking the extended release twice a day.   metoprolol  succinate (TOPROL -XL) 50 MG 24 hr tablet [id number] Yes Take 1 tablet (50 mg total) by mouth 2 (two) times daily. Take with or immediately following a meal. Monge, Damien BROCKS, NP  Active Self, Pharmacy Records  Multiple Vitamins-Minerals (MULTIVITAMIN WOMEN 50+) TABS [id number] Yes Take 1 tablet by mouth daily. [provider]  Active Pharmacy Records, Self  pantoprazole  (PROTONIX ) 40 MG tablet [id number] Yes TAKE 1 TABLET BY MOUTH DAILY  BEFORE BREAKFAST [name], MD  Active Self, Pharmacy Records           Med Note [place], [place] R   Thu Dec 09, 2023 10:26 AM)    pravastatin  (PRAVACHOL ) 40 MG tablet [id number] Yes Take 40 mg by mouth daily.  Patient taking differently: Take 40 mg by  mouth in the morning and at bedtime. Reviewed with PCP and Damien Braver, NP in secure chat 07/17/24 - Patient will take 40mg  until seen   [provider]  Active Self, Pharmacy Records  quiNIDine sulfate  200 MG tablet [id number] Yes Take 1 tablet (200 mg total) by mouth 3 (three) times daily. Aniceto Daphne CROME, NP  Active Self, Pharmacy Records  SPIRIVA  RESPIMAT 2.5 MCG/ACT AERS [id number] Yes Inhale 2 puffs into the lungs daily. [provider]  Active Self, Pharmacy Records  spironolactone  (ALDACTONE ) 25 MG tablet [id number] Yes Take 0.5 tablets (12.5 mg total) by mouth daily. Pietro Redell RAMAN, MD  Active Self, Pharmacy Records  Tiotropium Bromide  Monohydrate (SPIRIVA  RESPIMAT) 1.25 MCG/ACT AERS [id number]  Inhale 1 puff into the lungs daily.  Patient not taking: Reported on 07/17/2024   [provider]  Active Self, Pharmacy Records  torsemide  (DEMADEX ) 20 MG tablet [id number] Yes TAKE 1 TABLET BY MOUTH DAILY Crenshaw,  Redell RAMAN, MD  Active Self, Pharmacy Records  valACYclovir  (VALTREX ) 1000 MG tablet [id number] Yes Take 1 tablet (1,000 mg total) by mouth 2 (two) times daily.  Patient taking differently: Take 1,000 mg by mouth as needed.   Alvia Bring, DO  Active Self, Pharmacy Records            Home Care and Equipment/Supplies: Were Home Health Services Ordered?: Yes Name of Home Health Agency:: [facility] Has Agency set up a time to come to your home?: No (Patient states she is familiar with home health agency and if she does not hear from them today she will call them in the morning to find out when they are coming) Any new equipment or medical supplies ordered?: No  Functional Questionnaire: Do you need assistance with bathing/showering or dressing?: No Do you need assistance with meal preparation?: No Do you need assistance with eating?: No Do you have difficulty maintaining continence: Yes (patient states she had bowel incontinence the night she  got home - I just wasn't fast enough) Do you need assistance with getting out of bed/getting out of a chair/moving?: No Do you have difficulty managing or taking your medications?: No  Follow up appointments reviewed: PCP Follow-up appointment confirmed?: Yes Date of PCP follow-up appointment?: 07/24/24 Follow-up Provider: PCP appt scheduled by Care Guide Specialist Hospital Follow-up appointment confirmed?: NA (Patient states she will call cardiology office where Damien Braver, NP sees patients and schedule an appt) Do you need transportation to your follow-up appointment?: No (Uses Medicaid transportation or daughter takes her to appts on Friday) Do you understand care options if your condition(s) worsen?: Yes-patient verbalized understanding  SDOH Interventions Today    Flowsheet Row Most Recent Value  SDOH Interventions   Food Insecurity Interventions Intervention Not Indicated  Housing Interventions Intervention Not Indicated  Transportation Interventions AMB Referral  Utilities Interventions Intervention Not Indicated    Goals Addressed             This Visit's Progress    VBCI Transitions of Care (TOC) Care Plan       Problems:  Recent Hospitalization for treatment of shortness of breath Home Health services barrier: patient agrees to call [facility] if she does not hear from them by the end of day 07/17/24 - states she is known to them  and SDOH barrier: Referral placed for BWS, Orlean Fey - with call scheduled 07/18/24 re: transportation   Goal:  Over the next 30 days, the patient will not experience hospital readmission  Interventions:  Transitions of Care: Doctor Visits  - discussed the importance of doctor visits Arranged PCP follow-up within 7 days (Care Guide Scheduled) Medication reconciliation completed - [facility] RN sent secure chat message to PCP and Damien Braver, NP with cardiology - Patient will hold carvedilol  as per Damien Braver, NP - Will take  Pravastatin  40mg  and will use Spiriva  2.5mcg per PCP  Heart Failure Interventions: Advised patient to weigh each morning after emptying bladder Discussed importance of daily weight and advised patient to weigh and record daily Reviewed role of diuretics in prevention of fluid overload and management of heart failure; Assessed social determinant of health barriers   Patient Self Care Activities:  Attend all scheduled provider appointments Call pharmacy for medication refills 3-7 days in advance of running out of medications Call provider office for new concerns or questions  Notify RN Care Manager of TOC call rescheduling needs Participate in Transition of Care Program/Attend TOC scheduled calls  Take medications as prescribed   call office if I gain more than 2 pounds in one day or 5 pounds in one week use salt in moderation watch for swelling in feet, ankles and legs every day weigh myself daily Call cardiology office to schedule appt with Damien Braver NP  Plan:  Telephone follow up appointment with care management team member scheduled for:  07/27/24 in the afternoon The patient has been provided with contact information for the care management team and has been advised to call with any health related questions or concerns.         Shona Prow RN, CCM [place]  VBCI-Population Health RN Care Manager [phone]

## 2024-07-17 NOTE — Patient Instructions (Signed)
 Visit Information  Thank you for taking time to visit with me today. Please don't hesitate to contact me if I can be of assistance to you before our next scheduled telephone appointment.  Our next appointment is by telephone on 07/27/24 in the afternoon  Following is a copy of your care plan:   Goals Addressed             This Visit's Progress    VBCI Transitions of Care (TOC) Care Plan       Problems:  Recent Hospitalization for treatment of shortness of breath Home Health services barrier: patient agrees to call [facility] if she does not hear from them by the end of day 07/17/24 - states she is known to them  and SDOH barrier: Referral placed for BWS, Tanya Hogan - with call scheduled 07/18/24 re: transportation   Goal:  Over the next 30 days, the patient will not experience hospital readmission  Interventions:  Transitions of Care: Doctor Visits  - discussed the importance of doctor visits Arranged PCP follow-up within 7 days (Care Guide Scheduled) Medication reconciliation completed - [facility] RN sent secure chat message to PCP and Damien Braver, NP with cardiology - Patient will hold carvedilol  as per Damien Braver, NP - Will take Pravastatin  40mg  and will use Spiriva  2.5mcg per PCP  Heart Failure Interventions: Advised patient to weigh each morning after emptying bladder Discussed importance of daily weight and advised patient to weigh and record daily Reviewed role of diuretics in prevention of fluid overload and management of heart failure; Assessed social determinant of health barriers   Patient Self Care Activities:  Attend all scheduled provider appointments Call pharmacy for medication refills 3-7 days in advance of running out of medications Call provider office for new concerns or questions  Notify RN Care Manager of TOC call rescheduling needs Participate in Transition of Care Program/Attend TOC scheduled calls Take medications as prescribed   call  office if I gain more than 2 pounds in one day or 5 pounds in one week use salt in moderation watch for swelling in feet, ankles and legs every day weigh myself daily Call cardiology office to schedule appt with Damien Braver NP  Plan:  Telephone follow up appointment with care management team member scheduled for:  07/27/24 in the afternoon The patient has been provided with contact information for the care management team and has been advised to call with any health related questions or concerns.         Patient verbalizes understanding of instructions and care plan provided today and agrees to view in MyChart. Active MyChart status and patient understanding of how to access instructions and care plan via MyChart confirmed with patient.     Telephone follow up appointment with care management team member scheduled for:07/27/24 The patient has been provided with contact information for the care management team and has been advised to call with any health related questions or concerns.   Please call the care guide team at [phone] if you need to cancel or reschedule your appointment.   Please call the Suicide and Crisis Lifeline: 988 call 1-800-273-TALK (toll free, 24 hour hotline) call 911 if you are experiencing a Mental Health or Behavioral Health Crisis or need someone to talk to.  Tanya Prow RN, CCM [place]  VBCI-Population Health RN Care Manager [phone]

## 2024-07-17 NOTE — Transitions of Care (Post Inpatient/ED Visit) (Signed)
   07/17/2024  Name: Tanya Hogan MRN: [id number] DOB: [birth year]  Today's TOC FU Call Status: Today's TOC FU Call Status:: Unsuccessful Call (1st Attempt) Unsuccessful Call (1st Attempt) Date: 07/17/24  Attempted to reach the patient regarding the most recent Inpatient/ED visit.  Follow Up Plan: Additional outreach attempts will be made to reach the patient to complete the Transitions of Care (Post Inpatient/ED visit) call.   Shona Prow RN, CCM [place]  VBCI-Population Health RN Care Manager [phone]

## 2024-07-18 ENCOUNTER — Other Ambulatory Visit: Payer: Self-pay

## 2024-07-18 ENCOUNTER — Other Ambulatory Visit (HOSPITAL_COMMUNITY): Payer: Self-pay

## 2024-07-18 NOTE — Patient Instructions (Signed)
 [name] Finder - I am sorry I was unable to reach you today for our scheduled appointment. I work with [name], MD and am calling to support your healthcare needs. Please contact me at [phone] at your earliest convenience. I look forward to speaking with you soon.   Thank you,  Orlean Fey, BSW [place]  [facility] Social Worker, [employer] [phone]

## 2024-07-24 ENCOUNTER — Ambulatory Visit: Admitting: Family Medicine

## 2024-07-24 ENCOUNTER — Encounter: Payer: Self-pay | Admitting: Family Medicine

## 2024-07-24 VITALS — BP 106/66 | HR 88 | Ht 61.0 in | Wt 189.0 lb

## 2024-07-24 DIAGNOSIS — Z853 Personal history of malignant neoplasm of breast: Secondary | ICD-10-CM | POA: Diagnosis not present

## 2024-07-24 DIAGNOSIS — E118 Type 2 diabetes mellitus with unspecified complications: Secondary | ICD-10-CM | POA: Diagnosis not present

## 2024-07-24 DIAGNOSIS — F331 Major depressive disorder, recurrent, moderate: Secondary | ICD-10-CM | POA: Diagnosis not present

## 2024-07-24 DIAGNOSIS — H43393 Other vitreous opacities, bilateral: Secondary | ICD-10-CM

## 2024-07-24 DIAGNOSIS — Z7984 Long term (current) use of oral hypoglycemic drugs: Secondary | ICD-10-CM

## 2024-07-24 MED ORDER — ALBUTEROL SULFATE HFA 108 (90 BASE) MCG/ACT IN AERS: 2.0000 | INHALATION_SPRAY | Freq: Four times a day (QID) | RESPIRATORY_TRACT | 1 refills | Status: DC | PRN

## 2024-07-24 MED ORDER — LUBIPROSTONE 24 MCG PO CAPS: 24.0000 ug | ORAL_CAPSULE | ORAL | 0 refills | Status: DC

## 2024-07-24 NOTE — Assessment & Plan Note (Signed)
 She had a breast cancer treated with lumpectomy, radiation and tamoxifen . She has no evidence of recurrent disease

## 2024-07-24 NOTE — Progress Notes (Signed)
 Established Patient Office Visit  Patient ID: Tanya Hogan, female    DOB: 05-23-1951  Age: 73 y.o. MRN: [id number] PCP: Tanya Tanya BIRCH, MD  Chief Complaint  Patient presents with   Hospitalization Follow-up    Subjective:     HPI  Here today for hospital follow-up.  She was admitted to [facility] November 20 and discharged home 2 days later on November 22.  She was admitted for ventricular tachyarrhythmia status post CRT-D.  She initially had presented with some shortness of breath.  She had also noted some increased lower extremity swelling.  She had had an episode of diverticulitis back in September.  For the ventricular tachycardia she was evaluated by cardiology and EP and they adjusted her quinidine.  BNP was also elevated initially in the emergency room indicating possible CHF exacerbation.  CTA was negative for pulmonary embolism she did have a small pericardial effusion though and some mild interstitial edema she reported that she had missed a few doses of spironolactone  at home but otherwise have been compliant with her torsemide   See GI and pulm tomorrow    Discussed the use of AI scribe software for clinical note transcription with the patient, who gave verbal consent to proceed.  History of Present Illness Tanya Hogan is a 73 year old female who presents with persistent diarrhea and medication management issues.  Diarrhea and gastrointestinal symptoms - Persistent diarrhea, worsened after returning home from the hospital - Diarrhea occurs primarily in the afternoon around 2 PM - Frequent, severe episodes requiring multiple trips to the bathroom - Imodium use results in constipation for 24 hours, followed by recurrence of diarrhea - No blood in stool - Abdominal bloating after breakfast, preceding onset of diarrhea - Weight decreased to 186 pounds since onset of diarrhea - Fatigue in the afternoons, sometimes attributed to cardiac  issues  Magnesium  supplementation intolerance - Magnesium  supplementation discontinued due to severe stomach pain - Attempts to take magnesium  every other day continued to cause discomfort - Concern regarding magnesium  levels  Bladder function and urinary symptoms - Previously experienced bladder urgency, treated with Gemtesa - Bladder urgency resolved since onset of diarrhea - Gemtesa discontinued - Minimal urinary output currently  Medication management issues - History of confusion and frustration with frequent medication changes during hospitalizations - Excess supply of medications at home due to changes - Transitioned from Coreg  to metoprolol , currently taking metoprolol   Glycemic control/Diabetes - Recent A1c of 7.3 from hospital visit - No dietary changes affecting blood glucose due to diarrhea - Using her CGM to help manage her glucose levels.    Ophthalmologic symptoms - Floaters present in both eyes, more pronounced in the left eye - Upcoming appointment with eye surgeon - Last eye doctor visit in March     ROS    Objective:     BP 106/66   Pulse 88   Ht 5' 1 (1.549 m)   Wt 189 lb (85.7 kg)   SpO2 95%   BMI 35.71 kg/m    Physical Exam Vitals and nursing note reviewed.  Constitutional:      Appearance: Normal appearance.  HENT:     Head: Normocephalic and atraumatic.  Eyes:     Conjunctiva/sclera: Conjunctivae normal.  Cardiovascular:     Rate and Rhythm: Normal rate and regular rhythm.  Pulmonary:     Effort: Pulmonary effort is normal.     Breath sounds: Normal breath sounds.  Skin:    General: Skin  is warm and dry.  Neurological:     Mental Status: She is alert.  Psychiatric:        Mood and Affect: Mood normal.      No results found for any visits on 07/24/24.    The ASCVD Risk score (Arnett DK, et al., 2019) failed to calculate for the following reasons:   Risk score cannot be calculated because patient has a medical history  suggesting prior/existing ASCVD    Assessment & Plan:   Problem List Items Addressed This Visit       Endocrine   Controlled diabetes mellitus type 2 with complications (HCC)     Other   Moderate episode of recurrent major depressive disorder (HCC) - Primary   History of left breast cancer   She had a breast cancer treated with lumpectomy, radiation and tamoxifen . She has no evidence of recurrent disease       Other Visit Diagnoses       Vitreous floaters of both eyes           Assessment and Plan Assessment & Plan Chronic diarrhea Diarrhea occurs primarily in the afternoon, causing discomfort and fatigue. Previous antibiotic use may have contributed. Imodium provides temporary relief but causes constipation. No blood in stool. GI evaluation pending.  Chronic systolic heart failure No significant swelling. Discontinued magnesium  due to intolerance. - Provided list of magnesium -rich foods.  Magnesium  supplementation intolerance Severe gastrointestinal discomfort from magnesium  supplements. Concern about low magnesium  levels. - Provided list of magnesium -rich foods.  Type 2 diabetes mellitus A1c was 7.3, slightly elevated. Diarrhea may affect glucose control.  Vitreous floaters, bilateral, with history of vitreoretinal surgery Reports floaters, more in left eye. Eye surgeon evaluation pending. - Follow up with eye surgeon.   Return in about 3 months (around 10/22/2024).    Tanya Byars, MD [facility] Primary [facility] at [facility]

## 2024-07-26 ENCOUNTER — Telehealth: Payer: Self-pay

## 2024-07-26 NOTE — Telephone Encounter (Signed)
 Tonya,  How is this normally forwarded to medical records for the patient?

## 2024-07-26 NOTE — Telephone Encounter (Signed)
 Copied from CRM [id number]. Topic: Medical Record Request - Other >> Jul 26, 2024  9:22 AM Rosaria BRAVO wrote: Reason for CRM: Rosina calling from [employer]  Best contact: [phone]   Called following up on the request for chart notes that was submitted 07/12/2024.   Fax: [phone]

## 2024-07-27 ENCOUNTER — Other Ambulatory Visit

## 2024-07-27 ENCOUNTER — Telehealth: Payer: Self-pay

## 2024-07-27 NOTE — Patient Instructions (Signed)
 [name] Finder - I am sorry I was unable to reach you today for our scheduled appointment. I work with [name], MD and am calling to support your healthcare needs. Please contact me at [phone] at your earliest convenience. I look forward to speaking with you soon.   Thank you,  Orlean Fey, BSW [place]  [facility] Social Worker, [employer] [phone]

## 2024-07-27 NOTE — Patient Instructions (Signed)
 Visit Information  Thank you for taking time to visit with me today. Please don't hesitate to contact me if I can be of assistance to you before our next scheduled telephone appointment.  Our next appointment is by telephone on 08/04/24 in the afternoon  Following is a copy of your care plan:   Goals Addressed             This Visit's Progress    VBCI Transitions of Care (TOC) Care Plan       Problems:  Recent Hospitalization for treatment of shortness of breath Home Health services barrier: patient agrees to call [facility] home [facility] if she does not hear from them by the end of day 07/17/24 - states she is known to them  and SDOH barrier: Referral placed for BWS, Orlean Fey - with call scheduled 07/18/24 re: transportation   Goal:  Over the next 30 days, the patient will not experience hospital readmission  Interventions:  Transitions of Care: Doctor Visits  - discussed the importance of doctor visits Arranged PCP follow-up within 7 days (Care Guide Scheduled) Medication reconciliation completed - [facility] RN sent secure chat message to PCP and Damien Braver, NP with cardiology - Patient will hold carvedilol  as per Damien Braver, NP - Will take Pravastatin  40mg  and will use Spiriva  2.5mcg per PCP Update 07/27/24: Spoke with patient who states since we last spoke she has seen her PCP, Pulm, and GI - reviewed all notes prior to call and discussed with patient the Amitiza  and reiterated the instructions per note in office visit note as follows: stop Amitiza  on 07/25/24 but said, Will check for stool infection/C.diff - pt has collected stool - If after stopping Amitiza , your diarrhea of 7 times resolve, then we can monitor Please give me an update in a week to 10 days on your bowel movements after stopping Amitiza  patient verbalized understanding. Patient states her diarrhea has resolved and wonders if she should restart Metformin   and states sugar today via Freestyle is 108/weight 187lbs and  TOC RN sent message to PCP, Dr Alvan as follows: On phone with patient now - she says her sugar was 108 today-states she has been holding Metformin  due to diarrhea and this has currently resolved - wonders if she should restart Metformin  at lower dose/maybe once daily instead of twice  Response from Dr Alvan as follows: sounds good, have her retry with once a day, hopefully that will be a good fit. If diarrhea again then stop completely and let us  know Thank you  Patient told [facility] RN that she has not seen anyone from home health - [facility] RN made conference call with patient to [facility] who advised that ROC was done 07/18/24 and next PT visit scheduled for 07/31/24. Patient reports she now has the rescue inhaler.   Heart Failure Interventions: Advised patient to weigh each morning after emptying bladder Discussed importance of daily weight and advised patient to weigh and record daily Reviewed role of diuretics in prevention of fluid overload and management of heart failure; Assessed social determinant of health barriers   Patient Self Care Activities:  Attend all scheduled provider appointments Call pharmacy for medication refills 3-7 days in advance of running out of medications Call provider office for new concerns or questions  Notify RN Care Manager of TOC call rescheduling needs Participate in Transition of Care Program/Attend TOC scheduled calls Take medications as prescribed   call office if I gain more than 2 pounds in one day or  5 pounds in one week use salt in moderation watch for swelling in feet, ankles and legs every day weigh myself daily Call cardiology office to schedule appt with Damien Braver NP  Plan:  Telephone follow up appointment with care management team member scheduled for:  08/04/24 in the afternoon The patient has been provided with contact information for the care management team and has been advised to call with any health related questions or concerns.          Patient verbalizes understanding of instructions and care plan provided today and agrees to view in MyChart. Active MyChart status and patient understanding of how to access instructions and care plan via MyChart confirmed with patient.     Telephone follow up appointment with care management team member scheduled for: 08/04/24 The patient has been provided with contact information for the care management team and has been advised to call with any health related questions or concerns.   Please call the care guide team at [phone] if you need to cancel or reschedule your appointment.   Please call the Suicide and Crisis Lifeline: 988 go to [facility] Urgent [facility] [address] [place] [phone]) call 911 if you are experiencing a Mental Health or Behavioral Health Crisis or need someone to talk to.  Shona Prow RN, CCM [place]  VBCI-Population Health RN Care Manager [phone]

## 2024-07-27 NOTE — Transitions of Care (Post Inpatient/ED Visit) (Signed)
 Transition of Care week 3  Visit Note  07/27/2024  Name: Tanya Hogan MRN: [id number]          DOB: 1951/05/04  Situation: Patient enrolled in [facility] 30-day program. Visit completed with patient by telephone.   Background:  Admit/Discharge Date   11/20 -11/22  Tanya Hogan   Primary Diagnosis: shortness of breath  Initial Transition Care Management Follow-up Telephone Call Discharge Date and Diagnosis: 07/15/24, shortness of breath   Past Medical History:  Diagnosis Date   Alkaline phosphatase elevation    Asthma    Breast cancer (HCC) 2011   Carpal tunnel syndrome    bilateral   Chronic HFrEF (heart failure with reduced ejection fraction) (HCC)    Cirrhosis, non-alcoholic (HCC)    Closed fracture of unspecified part of radius (alone)    DDD (degenerative disc disease), lumbar    Depression    pt denies   Diabetes mellitus without complication (HCC)    Enlarged heart    Fibromyalgia    GERD (gastroesophageal reflux disease)    History of kidney stones    HTN (hypertension)    Hypothyroidism    IBS (irritable bowel syndrome)    LBBB (left bundle branch block)    Microcytic anemia 05/14/2017   Mitral regurgitation    NICM (nonischemic cardiomyopathy) (HCC)    Pneumonia    Presence of permanent cardiac pacemaker    Pulmonary hypertension (HCC)    PVD (peripheral vascular disease)    2019   Sleep apnea    uses cpap   Tricuspid regurgitation    Ventricular tachyarrhythmia (HCC)     Assessment: Patient Reported Symptoms: Cognitive Cognitive Status: No symptoms reported, Normal speech and language skills, Alert and oriented to person, place, and time      Neurological Neurological Review of Symptoms: No symptoms reported    HEENT HEENT Symptoms Reported: No symptoms reported      Cardiovascular Cardiovascular Symptoms Reported: No symptoms reported Weight: 187 lb (84.8 kg)  Respiratory Respiratory Symptoms Reported: Shortness of breath, Productive cough Other  Respiratory Symptoms: patient reports a little shortness of breath no worse than normal and reports light sputum - in the morning Respiratory Management Strategies: BiPAP, Medication therapy Respiratory Self-Management Outcome: 4 (good)  Endocrine Endocrine Symptoms Reported: No symptoms reported Is patient diabetic?: Yes Is patient checking blood sugars at home?: Yes List most recent blood sugar readings, include date and time of day: Patient reports sugar is 108    Gastrointestinal Gastrointestinal Symptoms Reported: No symptoms reported Additional Gastrointestinal Details: patient states she moved her bowels early this morning and has been going at least once a day - denies further diarrhea - GI stopped Amitiza  on 07/25/24 but said, Will check for stool infection/C.diff - pt has collected stool - If after stopping Amitiza , your diarrhea of 7 times resolve, then we can monitor  Please give me an update in a week to 10 days on your bowel movements after stopping Amitiza  Gastrointestinal Self-Management Outcome: 3 (uncertain)    Genitourinary      Integumentary      Musculoskeletal          Psychosocial           Today's Vitals   07/27/24 1429  Weight: 187 lb (84.8 kg)   Pain Scale: 0-10 Pain Score: 0-No pain  Medications Reviewed Today     Reviewed by Lauro Shona LABOR, RN (Registered Nurse) on 07/27/24 at 1426  Med List Status: <None>   Medication  Order Taking? Sig Documenting Provider Last Dose Status Informant  albuterol  (PROVENTIL ) (2.5 MG/3ML) 0.083% nebulizer solution [id number] Yes Take 2.5 mg by nebulization every 6 (six) hours as needed for wheezing or shortness of breath. [provider]  Active Self, Pharmacy Records  albuterol  (VENTOLIN  HFA) 108 [phone] Base) MCG/ACT inhaler [id number] Yes Inhale 2 puffs into the lungs every 6 (six) hours as needed for wheezing or shortness of breath. [name], MD  Active   alendronate  (FOSAMAX ) 70 MG tablet  [id number] Yes TAKE 1 TABLET BY MOUTH EVERY 7  DAYS WITH FULL GLASS OF WATER  ON AN EMPTY STOMACH [name], MD  Active Self, Pharmacy Records           Med Note (COFFELL, JON HERO   Mon Jun 05, 2024  2:24 PM)    amiodarone  (PACERONE ) 200 MG tablet [id number] Yes Take 1 tablet (200 mg total) by mouth daily. [name], MD  Active Self, Pharmacy Records  aspirin  81 MG chewable tablet [id number] Yes Chew 81 mg by mouth daily. [provider]  Active Self, Pharmacy Records  Calcium  Carbonate-Vit D-Min (CALCIUM  600+D PLUS MINERALS) 600-400 MG-UNIT TABS [id number] Yes 1 tab p.o. twice daily Curtis Debby PARAS, MD  Active Self, Pharmacy Records  Continuous Glucose Receiver (FREESTYLE LIBRE 3 READER) DEVI [id number] Yes Use for continuous glucose monitoring [name], MD  Active Self, Pharmacy Records  Continuous Glucose Sensor (FREESTYLE LIBRE 3 [place]) [place] [id number] Yes by Does not apply route. Change every 14 days [provider]  Active Self, Pharmacy Records           Med Note JACKOLYN, [place] R   Thu Dec 09, 2023 10:26 AM)    EPINEPHrine  0.3 mg/0.3 mL IJ SOAJ injection [id number] Yes Inject 0.3 mg into the muscle as needed for anaphylaxis. [name], MD  Active Self, Pharmacy Records           Med Note (COFFELL, JON HERO   Mon Jun 05, 2024  2:27 PM) Has available, never used.  fexofenadine  (ALLEGRA ) 180 MG tablet [id number] Yes Take 180 mg by mouth daily. [provider]  Active Self, Pharmacy Records  fluticasone -salmeterol (WIXELA INHUB ) 250-50 MCG/ACT AEPB [id number] Yes Inhale 1 puff into the lungs in the morning and at bedtime. [provider]  Active Self, Pharmacy Records  glucose blood test strip [id number] Yes To be used to check blood glucose Dx:E11.8 FreeStyle Precision blood glucose for Freestyle Libre 2 [name], MD  Active Self, Pharmacy Records  isosorbide  mononitrate (IMDUR ) 30 MG 24 hr tablet  [id number] Yes TAKE 1 TABLET BY MOUTH DAILY Pietro Redell RAMAN, MD  Active Self, Pharmacy Records  levothyroxine  (SYNTHROID ) 112 MCG tablet [id number] Yes TAKE 1 TABLET BY MOUTH DAILY [name], MD  Active Self, Pharmacy Records  lubiprostone  (AMITIZA ) 24 MCG capsule [id number]  Take 1 capsule (24 mcg total) by mouth every other day. Patient reports provider has decreased to every three days.  Patient not taking: Reported on 07/27/2024   [name], MD  Active   metFORMIN  (GLUCOPHAGE -XR) 500 MG 24 hr tablet [id number] Yes TAKE 1 TABLET BY MOUTH TWICE  DAILY WITH MEALS  Patient taking differently: Take 500 mg by mouth daily. Changed per conversation with Dr Alvan via secure chat 07/27/24 - hold with return of diarrhea and call office   [name], Dorothyann BIRCH, MD  Active Self, Pharmacy Records           Med  Note STEFFI, [place]   Sat May 13, 2024 10:38 AM) Patient is taking the extended release twice a day.   metoprolol  succinate (TOPROL -XL) 50 MG 24 hr tablet [id number] Yes Take 1 tablet (50 mg total) by mouth 2 (two) times daily. Take with or immediately following a meal. Monge, Damien BROCKS, NP  Active Self, Pharmacy Records  Multiple Vitamins-Minerals (MULTIVITAMIN WOMEN 50+) TABS [id number] Yes Take 1 tablet by mouth daily. [provider]  Active Pharmacy Records, Self  pantoprazole  (PROTONIX ) 40 MG tablet [id number] Yes TAKE 1 TABLET BY MOUTH DAILY  BEFORE BREAKFAST [name], MD  Active Self, Pharmacy Records           Med Note [place], [place] R   Thu Dec 09, 2023 10:26 AM)    pravastatin  (PRAVACHOL ) 40 MG tablet [id number] Yes Take 40 mg by mouth daily. [provider]  Active Self, Pharmacy Records  quiNIDine sulfate  200 MG tablet [id number] Yes Take 1 tablet (200 mg total) by mouth 3 (three) times daily. Aniceto Daphne CROME, NP  Active Self, Pharmacy Records  SPIRIVA  RESPIMAT 2.5 MCG/ACT AERS [id number] Yes Inhale 2 puffs into the lungs daily.  [provider]  Active Self, Pharmacy Records  spironolactone  (ALDACTONE ) 25 MG tablet [id number] Yes Take 0.5 tablets (12.5 mg total) by mouth daily. Pietro Redell RAMAN, MD  Active Self, Pharmacy Records  torsemide  (DEMADEX ) 20 MG tablet [id number] Yes TAKE 1 TABLET BY MOUTH DAILY Pietro Redell RAMAN, MD  Active Self, Pharmacy Records  valACYclovir  (VALTREX ) 1000 MG tablet [id number] Yes Take 1 tablet (1,000 mg total) by mouth 2 (two) times daily.  Patient taking differently: Take 1,000 mg by mouth as needed.   Alvia Bring, DO  Active Self, Pharmacy Records            Recommendation:   Continue Current Plan of Care  Follow Up Plan:   Telephone follow up appointment date/time:  08/04/24 in the afternoon  Shona Prow RN, CCM [place]  VBCI-Population Health RN Care Manager [phone]

## 2024-07-28 NOTE — Telephone Encounter (Signed)
 Not sure what MR are being requested. I called the number provided and spoke w/Mike to see what is needed and was informed that if Dr. Alvan can amend the note from 12/1 to include that she is using her CMG this can be used and faxed to them. Otherwise they cannot process her order until she comes in and this is in the note at that visit.   Will fwd to pcp to make updates

## 2024-07-31 ENCOUNTER — Telehealth: Payer: Self-pay

## 2024-07-31 NOTE — Telephone Encounter (Signed)
 Note updated to reflect she is using her CGM and printed and given to Tonya B

## 2024-07-31 NOTE — Telephone Encounter (Signed)
 Information sent. Confirmation page received.

## 2024-07-31 NOTE — Telephone Encounter (Signed)
 Ok for verbal order for SN

## 2024-07-31 NOTE — Telephone Encounter (Signed)
 Copied from CRM [id number]. Topic: Clinical - Home Health Verbal Orders >> Jul 31, 2024  8:14 AM Tanya Hogan wrote: Caller/Agency: Tanya Hogan [facility]   Callback Number: [phone] - secured line ( please leave name and title if voicemail left)  Service Requested: Skilled Nursing, help manage chronic disease  Frequency: 1 week 4  Any new concerns about the patient? No

## 2024-08-01 ENCOUNTER — Ambulatory Visit

## 2024-08-01 NOTE — Telephone Encounter (Signed)
 Spoke with carol at [facility] and informed of verbal approvals.

## 2024-08-02 ENCOUNTER — Inpatient Hospital Stay (HOSPITAL_COMMUNITY)
Admission: EM | Admit: 2024-08-02 | Discharge: 2024-08-08 | DRG: 291 | Disposition: A | Attending: Internal Medicine | Admitting: Internal Medicine

## 2024-08-02 ENCOUNTER — Emergency Department (HOSPITAL_COMMUNITY)

## 2024-08-02 ENCOUNTER — Telehealth: Payer: Self-pay | Admitting: Cardiology

## 2024-08-02 ENCOUNTER — Other Ambulatory Visit: Payer: Self-pay

## 2024-08-02 ENCOUNTER — Encounter (HOSPITAL_COMMUNITY): Payer: Self-pay

## 2024-08-02 DIAGNOSIS — I5023 Acute on chronic systolic (congestive) heart failure: Secondary | ICD-10-CM | POA: Diagnosis present

## 2024-08-02 DIAGNOSIS — I5022 Chronic systolic (congestive) heart failure: Secondary | ICD-10-CM

## 2024-08-02 DIAGNOSIS — E876 Hypokalemia: Secondary | ICD-10-CM | POA: Diagnosis present

## 2024-08-02 DIAGNOSIS — J449 Chronic obstructive pulmonary disease, unspecified: Secondary | ICD-10-CM | POA: Diagnosis present

## 2024-08-02 DIAGNOSIS — E039 Hypothyroidism, unspecified: Secondary | ICD-10-CM | POA: Diagnosis present

## 2024-08-02 DIAGNOSIS — I509 Heart failure, unspecified: Principal | ICD-10-CM

## 2024-08-02 LAB — CBC
HCT: 39 % (ref 36.0–46.0)
Hemoglobin: 12.2 g/dL (ref 12.0–15.0)
MCH: 31.1 pg (ref 26.0–34.0)
MCHC: 31.3 g/dL (ref 30.0–36.0)
MCV: 99.5 fL (ref 80.0–100.0)
Platelets: 231 K/uL (ref 150–400)
RBC: 3.92 MIL/uL (ref 3.87–5.11)
RDW: 15.2 % (ref 11.5–15.5)
WBC: 7.7 K/uL (ref 4.0–10.5)
nRBC: 0 % (ref 0.0–0.2)

## 2024-08-02 LAB — TROPONIN I (HIGH SENSITIVITY)
Troponin I (High Sensitivity): 19 ng/L — ABNORMAL HIGH (ref <18)
Troponin I (High Sensitivity): 21 ng/L — ABNORMAL HIGH (ref <18)

## 2024-08-02 LAB — BRAIN NATRIURETIC PEPTIDE: B Natriuretic Peptide: 1744.7 pg/mL — ABNORMAL HIGH (ref 0.0–100.0)

## 2024-08-02 LAB — BASIC METABOLIC PANEL WITH GFR
Anion gap: 10 (ref 5–15)
BUN: 20 mg/dL (ref 8–23)
CO2: 27 mmol/L (ref 22–32)
Calcium: 9.8 mg/dL (ref 8.9–10.3)
Chloride: 102 mmol/L (ref 98–111)
Creatinine, Ser: 0.99 mg/dL (ref 0.44–1.00)
GFR, Estimated: 59 mL/min — ABNORMAL LOW (ref >60)
Glucose, Bld: 109 mg/dL — ABNORMAL HIGH (ref 70–99)
Potassium: 3.2 mmol/L — ABNORMAL LOW (ref 3.5–5.1)
Sodium: 139 mmol/L (ref 135–145)

## 2024-08-02 NOTE — ED Triage Notes (Signed)
 Patient BIB [facility] from home for CHF exacerbation, seen on the 4th for same but has gotten worse and more painful, EMS reports +2 pitting edema in feet, patient reports swelling in hands as well. Patient received 324mg  aspirin  and 4mg  zofran , 20 g hand.   132/88 HR 90 CBG 114 RR 18 95% RA

## 2024-08-02 NOTE — ED Provider Triage Note (Addendum)
 Emergency Medicine Provider Triage Evaluation Note  Tanya Hogan , a 73 y.o. female  was evaluated in triage.  Pt complains of increased swelling and weight over the past few days.  Has associated chest heaviness and dyspnea on exertion.  Known history of HFrEF.  Compliant with diuretic..  Negative PE study the end of last month.  Review of Systems  Positive:  Negative:   Physical Exam  BP 107/80 (BP Location: Left Arm)   Pulse 90   Temp 98.2 F (36.8 C)   Resp 18   Ht 5' 1 (1.549 m)   Wt 83.9 kg   SpO2 92%   BMI 34.96 kg/m  Gen:   Awake, no distress   Resp:  Normal effort  MSK:   Moves extremities without difficulty  Other:  Bilateral lower extremity edema  Medical Decision Making  Medically screening exam initiated at 7:49 PM.  Appropriate orders placed.  Tanya Hogan was informed that the remainder of the evaluation will be completed by another provider, this initial triage assessment does not replace that evaluation, and the importance of remaining in the ED until their evaluation is complete.     Donnajean Lynwood DEL, PA-C 08/02/24 1950    Donnajean Lynwood DEL, PA-C 08/02/24 1951

## 2024-08-02 NOTE — Patient Instructions (Signed)
 Visit Information  Thank you for taking time to visit with me today. Please don't hesitate to contact me if I can be of assistance to you before our next scheduled appointment.  Our next appointment is by telephone on 12/23 at 10am Please call the care guide team at [phone] if you need to cancel or reschedule your appointment.   Following is a copy of your care plan:   Goals Addressed             This Visit's Progress    BSW Goals       Current SDOH Barriers:  Transportation  Interventions: Patient interviewed and appropriate screenings performed Referred patient to community resources  Provided patient with information about [facility] Advised patient to complete application sent by the [facility] and return. BSW will follow up to confirm submission.          Please call the Suicide and Crisis Lifeline: 988 call the [place]  National Suicide Prevention Lifeline: 919-246-4361 or TTY: 727-150-5314 TTY (817)557-6147) to talk to a trained counselor call 1-800-273-TALK (toll free, 24 hour hotline) go to [facility] [address] [place] [phone]) call 911 if you are experiencing a Mental Health or Behavioral Health Crisis or need someone to talk to.  Patient verbalized understanding of Care plan and visit instructions communicated this visit  Orlean Fey, BSW [facility]  [facility] Social Worker, [employer] [facility] [phone]

## 2024-08-02 NOTE — Telephone Encounter (Signed)
°  Called back after hours outpatient call. Patient tells me that she is weighing 191 lb, reports she is unable to walk and has experienced extreme shortness of breath. She is unsure of what could be causing her to be holding on to fluid. Due to her symptoms I have expressed to her the importance of going to the ER to get evaluated and treated. She tells me that she is unable to drive. I instructed her that if she cannot get someone to drive her that she is to dial  911 and have EMS transport her to the nearest ER. She expresses understanding. All questions were answered.   Waddell Tanya Donath, PA-C 08/02/2024 5:57 PM

## 2024-08-02 NOTE — Patient Outreach (Signed)
 Social Drivers of Health  Community Resource and Care Coordination Visit Note   08/02/2024  Name: Tanya Hogan MRN: [id number] DOB:1950/09/21  Situation: Referral received for [facility] needs assessment and assistance related to Transportation. I obtained verbal consent from Patient.  Visit completed with Patient on the phone.   Background:    BSW outreached patient to assist with transportation. BSW called [facility] with patient to see if they could provided free transportation, they confirmed that the address is within service area so they have sent patient an application to fill out and to send back. BSW will follow up with patient to confirm if they have received, filled out and sent in the application.  Assessment:   Goals Addressed             This Visit's Progress    BSW Goals       Current SDOH Barriers:  Transportation  Interventions: Patient interviewed and appropriate screenings performed Referred patient to community resources  Provided patient with information about [facility] Advised patient to complete application sent by the [facility] and return. BSW will follow up to confirm submission.          Recommendation:   attend all scheduled provider appointments call for transportation assistance at least one week before appointments Complete [facility] application  Follow Up Plan:   Telephone follow-up 12/23 at 10am   Orlean Fey, BSW [place]  [facility] Social Worker, [employer] [facility] [phone]

## 2024-08-03 DIAGNOSIS — E876 Hypokalemia: Secondary | ICD-10-CM | POA: Diagnosis present

## 2024-08-03 DIAGNOSIS — E1169 Type 2 diabetes mellitus with other specified complication: Secondary | ICD-10-CM | POA: Diagnosis present

## 2024-08-03 DIAGNOSIS — E039 Hypothyroidism, unspecified: Secondary | ICD-10-CM | POA: Diagnosis present

## 2024-08-03 DIAGNOSIS — K219 Gastro-esophageal reflux disease without esophagitis: Secondary | ICD-10-CM | POA: Diagnosis present

## 2024-08-03 DIAGNOSIS — Z6834 Body mass index (BMI) 34.0-34.9, adult: Secondary | ICD-10-CM | POA: Diagnosis not present

## 2024-08-03 DIAGNOSIS — G4700 Insomnia, unspecified: Secondary | ICD-10-CM | POA: Diagnosis present

## 2024-08-03 DIAGNOSIS — Z8679 Personal history of other diseases of the circulatory system: Secondary | ICD-10-CM | POA: Insufficient documentation

## 2024-08-03 DIAGNOSIS — I5023 Acute on chronic systolic (congestive) heart failure: Secondary | ICD-10-CM | POA: Diagnosis present

## 2024-08-03 DIAGNOSIS — E118 Type 2 diabetes mellitus with unspecified complications: Secondary | ICD-10-CM | POA: Diagnosis not present

## 2024-08-03 DIAGNOSIS — E038 Other specified hypothyroidism: Secondary | ICD-10-CM | POA: Diagnosis not present

## 2024-08-03 DIAGNOSIS — I10 Essential (primary) hypertension: Secondary | ICD-10-CM | POA: Diagnosis present

## 2024-08-03 DIAGNOSIS — M797 Fibromyalgia: Secondary | ICD-10-CM | POA: Diagnosis present

## 2024-08-03 DIAGNOSIS — E66811 Obesity, class 1: Secondary | ICD-10-CM | POA: Diagnosis present

## 2024-08-03 DIAGNOSIS — I5022 Chronic systolic (congestive) heart failure: Secondary | ICD-10-CM | POA: Diagnosis not present

## 2024-08-03 DIAGNOSIS — E785 Hyperlipidemia, unspecified: Secondary | ICD-10-CM | POA: Diagnosis not present

## 2024-08-03 DIAGNOSIS — Z7983 Long term (current) use of bisphosphonates: Secondary | ICD-10-CM | POA: Diagnosis not present

## 2024-08-03 DIAGNOSIS — Z7982 Long term (current) use of aspirin: Secondary | ICD-10-CM | POA: Diagnosis not present

## 2024-08-03 DIAGNOSIS — I428 Other cardiomyopathies: Secondary | ICD-10-CM | POA: Diagnosis present

## 2024-08-03 DIAGNOSIS — I071 Rheumatic tricuspid insufficiency: Secondary | ICD-10-CM | POA: Diagnosis present

## 2024-08-03 DIAGNOSIS — I809 Phlebitis and thrombophlebitis of unspecified site: Secondary | ICD-10-CM | POA: Diagnosis not present

## 2024-08-03 DIAGNOSIS — Z7984 Long term (current) use of oral hypoglycemic drugs: Secondary | ICD-10-CM | POA: Diagnosis not present

## 2024-08-03 DIAGNOSIS — F32A Depression, unspecified: Secondary | ICD-10-CM | POA: Diagnosis present

## 2024-08-03 DIAGNOSIS — Z9189 Other specified personal risk factors, not elsewhere classified: Secondary | ICD-10-CM | POA: Diagnosis not present

## 2024-08-03 DIAGNOSIS — E1151 Type 2 diabetes mellitus with diabetic peripheral angiopathy without gangrene: Secondary | ICD-10-CM | POA: Diagnosis present

## 2024-08-03 DIAGNOSIS — I110 Hypertensive heart disease with heart failure: Secondary | ICD-10-CM | POA: Diagnosis present

## 2024-08-03 DIAGNOSIS — G4733 Obstructive sleep apnea (adult) (pediatric): Secondary | ICD-10-CM | POA: Diagnosis present

## 2024-08-03 DIAGNOSIS — I272 Pulmonary hypertension, unspecified: Secondary | ICD-10-CM | POA: Diagnosis present

## 2024-08-03 DIAGNOSIS — Z79890 Hormone replacement therapy (postmenopausal): Secondary | ICD-10-CM | POA: Diagnosis not present

## 2024-08-03 DIAGNOSIS — I509 Heart failure, unspecified: Secondary | ICD-10-CM | POA: Diagnosis present

## 2024-08-03 DIAGNOSIS — J439 Emphysema, unspecified: Secondary | ICD-10-CM | POA: Diagnosis not present

## 2024-08-03 DIAGNOSIS — J4489 Other specified chronic obstructive pulmonary disease: Secondary | ICD-10-CM | POA: Diagnosis present

## 2024-08-03 DIAGNOSIS — I2781 Cor pulmonale (chronic): Secondary | ICD-10-CM | POA: Diagnosis present

## 2024-08-03 DIAGNOSIS — K7460 Unspecified cirrhosis of liver: Secondary | ICD-10-CM | POA: Diagnosis present

## 2024-08-03 DIAGNOSIS — Z79899 Other long term (current) drug therapy: Secondary | ICD-10-CM | POA: Diagnosis not present

## 2024-08-03 LAB — CUP PACEART REMOTE DEVICE CHECK
Battery Remaining Longevity: 88 mo
Battery Voltage: 3.08 V
Brady Statistic AP VP Percent: 99.03 %
Brady Statistic AP VS Percent: 0.92 %
Brady Statistic AS VP Percent: 0.01 %
Brady Statistic AS VS Percent: 0.03 %
Brady Statistic RA Percent Paced: 99.98 %
Brady Statistic RV Percent Paced: 0 %
Date Time Interrogation Session: 20251209015632
HighPow Impedance: 82 Ohm
Implantable Lead Connection Status: 753985
Implantable Lead Connection Status: 753985
Implantable Lead Connection Status: 753985
Implantable Lead Implant Date: 20030324
Implantable Lead Implant Date: 20230313
Implantable Lead Implant Date: 20251008
Implantable Lead Location: 753859
Implantable Lead Location: 753860
Implantable Lead Location: 753860
Implantable Lead Model: 3830
Implantable Lead Model: 4068
Implantable Pulse Generator Implant Date: 20251008
Lead Channel Impedance Value: 247 Ohm
Lead Channel Impedance Value: 304 Ohm
Lead Channel Impedance Value: 323 Ohm
Lead Channel Impedance Value: 437 Ohm
Lead Channel Impedance Value: 475 Ohm
Lead Channel Impedance Value: 494 Ohm
Lead Channel Sensing Intrinsic Amplitude: 1.1 mV
Lead Channel Sensing Intrinsic Amplitude: 16.3 mV
Lead Channel Setting Pacing Amplitude: 2.5 V
Lead Channel Setting Pacing Amplitude: 2.5 V
Lead Channel Setting Pacing Pulse Width: 0.4 ms
Lead Channel Setting Sensing Sensitivity: 0.3 mV
Zone Setting Status: 755011
Zone Setting Status: 755011

## 2024-08-03 LAB — TSH: TSH: 5.72 u[IU]/mL — ABNORMAL HIGH (ref 0.350–4.500)

## 2024-08-03 MED ORDER — ENOXAPARIN SODIUM 40 MG/0.4ML IJ SOSY
40.0000 mg | PREFILLED_SYRINGE | INTRAMUSCULAR | Status: DC
  Administered 2024-08-03 – 2024-08-08 (×6): 40 mg via SUBCUTANEOUS
  Filled 2024-08-03 (×6): qty 0.4

## 2024-08-03 MED ORDER — PRAVASTATIN SODIUM 40 MG PO TABS
40.0000 mg | ORAL_TABLET | Freq: Every day | ORAL | Status: DC
  Administered 2024-08-03 – 2024-08-07 (×5): 40 mg via ORAL
  Filled 2024-08-03 (×5): qty 1

## 2024-08-03 MED ORDER — ACETAMINOPHEN 325 MG PO TABS
650.0000 mg | ORAL_TABLET | ORAL | Status: DC | PRN
  Administered 2024-08-03 – 2024-08-07 (×6): 650 mg via ORAL
  Filled 2024-08-03 (×6): qty 2

## 2024-08-03 MED ORDER — SODIUM CHLORIDE 0.9 % IV SOLN: 250.0000 mL | INTRAVENOUS | Status: AC | PRN

## 2024-08-03 MED ORDER — METOPROLOL SUCCINATE ER 25 MG PO TB24: 50.0000 mg | ORAL_TABLET | Freq: Two times a day (BID) | ORAL | Status: DC

## 2024-08-03 MED ORDER — LEVOTHYROXINE SODIUM 112 MCG PO TABS
112.0000 ug | ORAL_TABLET | Freq: Every day | ORAL | Status: DC
  Administered 2024-08-04 – 2024-08-08 (×5): 112 ug via ORAL
  Filled 2024-08-03 (×5): qty 1

## 2024-08-03 MED ORDER — UMECLIDINIUM BROMIDE 62.5 MCG/ACT IN AEPB
1.0000 | INHALATION_SPRAY | Freq: Every day | RESPIRATORY_TRACT | Status: DC
  Administered 2024-08-04 – 2024-08-08 (×5): 1 via RESPIRATORY_TRACT
  Filled 2024-08-03: qty 7

## 2024-08-03 MED ORDER — POTASSIUM CHLORIDE CRYS ER 20 MEQ PO TBCR
40.0000 meq | EXTENDED_RELEASE_TABLET | Freq: Once | ORAL | Status: AC
  Administered 2024-08-03: 40 meq via ORAL
  Filled 2024-08-03: qty 2

## 2024-08-03 MED ORDER — FLUTICASONE FUROATE-VILANTEROL 200-25 MCG/ACT IN AEPB
1.0000 | INHALATION_SPRAY | Freq: Every day | RESPIRATORY_TRACT | Status: DC
  Administered 2024-08-04 – 2024-08-08 (×5): 1 via RESPIRATORY_TRACT
  Filled 2024-08-03: qty 28

## 2024-08-03 MED ORDER — SPIRONOLACTONE 12.5 MG HALF TABLET
12.5000 mg | ORAL_TABLET | Freq: Every day | ORAL | Status: DC
  Administered 2024-08-03 – 2024-08-08 (×6): 12.5 mg via ORAL
  Filled 2024-08-03 (×6): qty 1

## 2024-08-03 MED ORDER — BUMETANIDE 0.25 MG/ML IJ SOLN
1.5000 mg | INTRAMUSCULAR | Status: AC
  Administered 2024-08-03: 1.5 mg via INTRAVENOUS
  Filled 2024-08-03: qty 6

## 2024-08-03 MED ORDER — SODIUM CHLORIDE 0.9% FLUSH: 3.0000 mL | INTRAVENOUS | Status: DC | PRN

## 2024-08-03 MED ORDER — ONDANSETRON HCL 4 MG/2ML IJ SOLN: 4.0000 mg | Freq: Four times a day (QID) | INTRAMUSCULAR | Status: DC | PRN

## 2024-08-03 MED ORDER — TIOTROPIUM BROMIDE 2.5 MCG/ACT IN AERS: 2.0000 | INHALATION_SPRAY | Freq: Every day | RESPIRATORY_TRACT | Status: DC

## 2024-08-03 MED ORDER — AMIODARONE HCL 200 MG PO TABS
200.0000 mg | ORAL_TABLET | Freq: Every day | ORAL | Status: DC
  Administered 2024-08-03 – 2024-08-08 (×6): 200 mg via ORAL
  Filled 2024-08-03 (×6): qty 1

## 2024-08-03 MED ORDER — QUINIDINE SULFATE 200 MG PO TABS
200.0000 mg | ORAL_TABLET | Freq: Three times a day (TID) | ORAL | Status: DC
  Administered 2024-08-03 – 2024-08-08 (×15): 200 mg via ORAL
  Filled 2024-08-03 (×17): qty 1

## 2024-08-03 MED ORDER — LORATADINE 10 MG PO TABS
10.0000 mg | ORAL_TABLET | Freq: Every day | ORAL | Status: DC
  Administered 2024-08-03 – 2024-08-08 (×6): 10 mg via ORAL
  Filled 2024-08-03 (×6): qty 1

## 2024-08-03 MED ORDER — BUMETANIDE 0.25 MG/ML IJ SOLN
1.0000 mg | Freq: Two times a day (BID) | INTRAMUSCULAR | Status: DC
  Administered 2024-08-03: 1 mg via INTRAVENOUS
  Filled 2024-08-03 (×2): qty 4

## 2024-08-03 MED ORDER — SODIUM CHLORIDE 0.9% FLUSH
3.0000 mL | Freq: Two times a day (BID) | INTRAVENOUS | Status: DC
  Administered 2024-08-03 – 2024-08-08 (×10): 3 mL via INTRAVENOUS

## 2024-08-03 MED ORDER — PANTOPRAZOLE SODIUM 40 MG PO TBEC
40.0000 mg | DELAYED_RELEASE_TABLET | Freq: Every day | ORAL | Status: DC
  Administered 2024-08-04 – 2024-08-08 (×5): 40 mg via ORAL
  Filled 2024-08-03 (×5): qty 1

## 2024-08-03 NOTE — ED Notes (Signed)
 Pt instructed to call for assistance when she needs to use the bedside commode.

## 2024-08-03 NOTE — Progress Notes (Signed)
 Heart Failure Navigator Progress Note  Assessed for Heart & Vascular TOC clinic readiness.  Patient does not meet criteria due to she is a patient with the Advanced Heart Failure Team. .   Navigator will sign off at this time.   Stephane Haddock, BSN, Scientist, Clinical (histocompatibility And Immunogenetics) Only

## 2024-08-03 NOTE — TOC CM/SW Note (Signed)
 TOC consult received for d/c planning needs, possible SNF vs. HH. PT recs pending. Follow-up to be completed with patient as appropriate.   Merilee Batty, MSN, RN Case Management [phone]

## 2024-08-03 NOTE — H&P (Signed)
 History and Physical    Patient: Tanya Hogan FMW:[id number] DOB: Jan 23, 1951 DOA: 08/02/2024 DOS: the patient was seen and examined on 08/03/2024 PCP: [name], MD  Patient coming from: Home  Chief Complaint:  Chief Complaint  Patient presents with   Shortness of Breath   Leg Swelling   Chest Pain   HPI: Tanya Hogan is a 73 y.o. female with medical history significant of hypertension, heart failure with reduced ejection fraction, diabetes mellitus, depression, presents with swelling and difficulty breathing.  She has been experiencing swelling since December 4th, which led to a weight gain.  Reports least five pounds overnight. She reports difficulty walking.  She describes having difficulty breathing, feeling as though there is a heavy pressure on her chest, akin to 'a brick or two.' No chest pain, but she mentions heavy pressure in the chest. She also reports producing a lot of phlegm, which she describes as 'cottage cheese' in consistency. No dehydration.  She has a history of diabetes and mentions that her feet started hurting, prompting her to get an x-ray to rule out any fractures. The x-ray showed no fractures. She suspects the pain might be related to neuropathy or the swelling.  She lives at home. No nausea or vomiting.  In the emergency department patient was noted to be tachypneic with blood pressures otherwise maintained.  Labs significant for BNP 1744.7, high-sensitivity troponin 19-> 21, and potassium 3.2.  Patient was given potassium chloride  40 meq p.o. and Bumex  1.5 mg IV.   Review of Systems: As mentioned in the history of present illness. All other systems reviewed and are negative. Past Medical History:  Diagnosis Date   Alkaline phosphatase elevation    Asthma    Breast cancer (HCC) 2011   Carpal tunnel syndrome    bilateral   Chronic HFrEF (heart failure with reduced ejection fraction) (HCC)    Cirrhosis, non-alcoholic (HCC)    Closed  fracture of unspecified part of radius (alone)    DDD (degenerative disc disease), lumbar    Depression    pt denies   Diabetes mellitus without complication (HCC)    Enlarged heart    Fibromyalgia    GERD (gastroesophageal reflux disease)    History of kidney stones    HTN (hypertension)    Hypothyroidism    IBS (irritable bowel syndrome)    LBBB (left bundle branch block)    Microcytic anemia 05/14/2017   Mitral regurgitation    NICM (nonischemic cardiomyopathy) (HCC)    Pneumonia    Presence of permanent cardiac pacemaker    Pulmonary hypertension (HCC)    PVD (peripheral vascular disease)    2019   Sleep apnea    uses cpap   Tricuspid regurgitation    Ventricular tachyarrhythmia [facility])    Past Surgical History:  Procedure Laterality Date   BIV ICD GENERATOR CHANGEOUT N/A 05/31/2024   Procedure: BIV ICD GENERATOR CHANGEOUT;  Surgeon: Almetta Donnice LABOR, MD;  Location: [facility] INVASIVE CV LAB;  Service: Cardiovascular;  Laterality: N/A;   BIV PACEMAKER GENERATOR CHANGEOUT N/A 11/03/2021   Procedure: BIV PACEMAKER GENERATOR CHANGEOUT;  Surgeon: Waddell Danelle ORN, MD;  Location: [facility] INVASIVE CV LAB;  Service: Cardiovascular;  Laterality: N/A;   BREAST LUMPECTOMY Left 2011   COLONOSCOPY     FOOT SURGERY     KNEE SURGERY     LEAD INSERTION N/A 05/31/2024   Procedure: LEAD INSERTION;  Surgeon: Almetta Donnice LABOR, MD;  Location: [facility] INVASIVE CV LAB;  Service:  Cardiovascular;  Laterality: N/A;   LEAD REVISION/REPAIR N/A 11/03/2021   Procedure: LEAD REVISION/REPAIR;  Surgeon: Waddell Danelle ORN, MD;  Location: [facility] INVASIVE CV LAB;  Service: Cardiovascular;  Laterality: N/A;   MASTECTOMY Right    05/2019   PACEMAKER GENERATOR CHANGE N/A 08/23/2014   Procedure: PACEMAKER GENERATOR CHANGE;  Surgeon: Danelle ORN Waddell, MD;  Location: [facility] CATH LAB;  Service: Cardiovascular;  Laterality: N/A;   PACEMAKER PLACEMENT  2009   [place] [facility]   RECTAL SURGERY  1976   REDUCTION MAMMAPLASTY Left     05/2019   RIGHT/LEFT HEART CATH AND CORONARY ANGIOGRAPHY N/A 05/18/2024   Procedure: RIGHT/LEFT HEART CATH AND CORONARY ANGIOGRAPHY;  Surgeon: Wendel Lurena POUR, MD;  Location: [facility] INVASIVE CV LAB;  Service: Cardiovascular;  Laterality: N/A;   TOTAL SHOULDER ARTHROPLASTY Right 03/10/2018   TOTAL SHOULDER ARTHROPLASTY Right 03/10/2018   Procedure: RIGHT TOTAL SHOULDER ARTHROPLASTY;  Surgeon: Dozier Soulier, MD;  Location: [facility] OR;  Service: Orthopedics;  Laterality: Right;   TUBAL LIGATION     UPPER EXTREMITY VENOGRAPHY  05/18/2024   Procedure: UPPER EXTREMITY VENOGRAPHY;  Surgeon: Wendel Lurena POUR, MD;  Location: [facility] INVASIVE CV LAB;  Service: Cardiovascular;;   Social History:  reports that she quit smoking about 57 years ago. Her smoking use included cigarettes. She has never used smokeless tobacco. She reports that she does not drink alcohol  and does not use drugs.  Allergies[1]  Family History  Problem Relation Age of Onset   Heart disease Mother    Melanoma Mother    Parkinson's disease Mother    Heart disease Father    Heart disease Brother    Heart failure Other    Breast cancer Other    Hypertension Other     Prior to Admission medications  Medication Sig Start Date End Date Taking? Authorizing Provider  albuterol  (PROVENTIL ) (2.5 MG/3ML) 0.083% nebulizer solution Take 2.5 mg by nebulization every 6 (six) hours as needed for wheezing or shortness of breath.    [provider]  albuterol  (VENTOLIN  HFA) 108 (90 Base) MCG/ACT inhaler Inhale 2 puffs into the lungs every 6 (six) hours as needed for wheezing or shortness of breath. 07/24/24   [name], MD  alendronate  (FOSAMAX ) 70 MG tablet TAKE 1 TABLET BY MOUTH EVERY 7  DAYS WITH FULL GLASS OF WATER  ON AN EMPTY STOMACH 05/26/24   [name], MD  amiodarone  (PACERONE ) 200 MG tablet Take 1 tablet (200 mg total) by mouth daily. 07/03/24   [name], MD  aspirin  81 MG chewable tablet Chew 81 mg by  mouth daily.    [provider]  Calcium  Carbonate-Vit D-Min (CALCIUM  600+D PLUS MINERALS) 600-400 MG-UNIT TABS 1 tab p.o. twice daily 11/02/23   Curtis Debby PARAS, MD  Continuous Glucose Receiver (FREESTYLE LIBRE 3 READER) DEVI Use for continuous glucose monitoring 04/10/24   [name], MD  Continuous Glucose Sensor (FREESTYLE LIBRE 3 SENSOR) MISC by Does not apply route. Change every 14 days    [provider]  EPINEPHrine  0.3 mg/0.3 mL IJ SOAJ injection Inject 0.3 mg into the muscle as needed for anaphylaxis. 08/27/23   [name], MD  fexofenadine  (ALLEGRA ) 180 MG tablet Take 180 mg by mouth daily.    [provider]  fluticasone -salmeterol (WIXELA INHUB ) 250-50 MCG/ACT AEPB Inhale 1 puff into the lungs in the morning and at bedtime.    [provider]  glucose blood test strip To be used to check blood  glucose Dx:E11.8 FreeStyle Precision blood glucose for Freestyle Libre 2 05/02/24   [name], MD  isosorbide  mononitrate (IMDUR ) 30 MG 24 hr tablet TAKE 1 TABLET BY MOUTH DAILY 05/26/24   Pietro Redell RAMAN, MD  levothyroxine  (SYNTHROID ) 112 MCG tablet TAKE 1 TABLET BY MOUTH DAILY 07/05/24   [name], MD  lubiprostone  (AMITIZA ) 24 MCG capsule Take 1 capsule (24 mcg total) by mouth every other day. Patient reports provider has decreased to every three days. Patient not taking: Reported on 07/27/2024 07/24/24   [name], MD  metFORMIN  (GLUCOPHAGE -XR) 500 MG 24 hr tablet TAKE 1 TABLET BY MOUTH TWICE  DAILY WITH MEALS Patient taking differently: Take 500 mg by mouth daily. Changed per conversation with Dr Alvan via secure chat 07/27/24 - hold with return of diarrhea and call office 04/19/24   [name], MD  metoprolol  succinate (TOPROL -XL) 50 MG 24 hr tablet Take 1 tablet (50 mg total) by mouth 2 (two) times daily. Take with or immediately following a meal. 06/27/24   Monge, Damien BROCKS, NP  Multiple  Vitamins-Minerals (MULTIVITAMIN WOMEN 50+) TABS Take 1 tablet by mouth daily.    [provider]  pantoprazole  (PROTONIX ) 40 MG tablet TAKE 1 TABLET BY MOUTH DAILY  BEFORE BREAKFAST 10/20/22   [name], MD  pravastatin  (PRAVACHOL ) 40 MG tablet Take 40 mg by mouth daily. 01/26/24   [provider]  quiNIDine sulfate  200 MG tablet Take 1 tablet (200 mg total) by mouth 3 (three) times daily. 06/07/24   Aniceto Daphne CROME, NP  SPIRIVA  RESPIMAT 2.5 MCG/ACT AERS Inhale 2 puffs into the lungs daily. 06/21/24   [provider]  spironolactone  (ALDACTONE ) 25 MG tablet Take 0.5 tablets (12.5 mg total) by mouth daily. 06/08/24 09/06/24  Pietro Redell RAMAN, MD  torsemide  (DEMADEX ) 20 MG tablet TAKE 1 TABLET BY MOUTH DAILY 05/26/24   Pietro Redell RAMAN, MD  valACYclovir  (VALTREX ) 1000 MG tablet Take 1 tablet (1,000 mg total) by mouth 2 (two) times daily. Patient taking differently: Take 1,000 mg by mouth as needed. 05/17/23   Alvia Bring, DO    Physical Exam: Vitals:   08/03/24 0039 08/03/24 0400 08/03/24 0848 08/03/24 1030  BP: 96/62 115/76 106/77   Pulse: 88 89 90 88  Resp: 17 15 19  (!) 28  Temp: 97.7 F (36.5 C) 97.9 F (36.6 C) (!) 97.5 F (36.4 C)   TempSrc: Oral Oral Oral   SpO2: 95% 98% 96% 99%  Weight:      Height:        Constitutional: Elderly female currently in no acute distress Eyes: PERRL, lids and conjunctivae normal ENMT: Mucous membranes are moist. Posterior pharynx clear of any exudate or lesions.Normal dentition.  Neck: normal, supple, JVD present. Respiratory: clear to auscultation bilaterally, no wheezing, no crackles. Normal respiratory effort. No accessory muscle use.  Cardiovascular: Regular rate and rhythm, no murmurs / rubs / gallops.  +2 pitting edema. 2+ pedal pulses.  Abdomen: no tenderness, no masses palpated. Bowel sounds positive.  Musculoskeletal: no clubbing / cyanosis. No joint deformity upper and lower extremities. Good ROM, no  contractures. Normal muscle tone.  Skin: no rashes, lesions, ulcers. No induration Neurologic: CN 2-12 grossly intact. Strength 5/5 in all 4.  Psychiatric: Normal judgment and insight. Alert and oriented x 3. Normal mood.    Data Reviewed:  EKG revealed AV dual paced rhythm at 83 bpm.  Eviewed labs, imaging, and pertinent records as documented.  Assessment and  Plan:  Heart failure with reduced ejection fraction Acute on chronic.  Patient presents with complaints of progressively worsening swelling over the last week with reports of least a 5 to 10 pound weight gain.  On physical exam patient with at least 2+ pitting edema and JVD present.SABRA  BNP elevated at 1744.7.  Chest x-ray showing cardiomegaly with vascular congestion without focal consolidation.  Patient was started on Bumex  1.5 mg IV x 1 dose. - Heart failure order set utilized - Strict I&O's and daily weights - Continue Bumex  1 mg IV twice daily - Held metoprolol  due to acute decompensation - Continue spironolactone  - Appreciate cardiology consultative services, will follow-up for any further recommendations  Hypokalemia Acute.  Acute.  Potassium noted to be 3.2.  Patient was given potassium chloride  40 meqs p.o. - Continue to monitor and replace as needed  History of monomorphic VT Status post AICD Recent upgrade in October 2025 of Medtronic CRT-P to CRT-D and addition of RV lead  - Held metoprolol  - Continue amiodarone , and quinidine  Essential hypertension Blood pressures noted to currently be maintained. - Continue blood pressure regimen as tolerated  Controlled diabetes mellitus type 2, without long-term use of insulin  On admission glucose 109.  Last available hemoglobin A1c noted to be 5.5 when checked on 06/05/2024. - Hold metformin  - Holding needed sliding scale insulin   COPD Patient without wheezing noted on physical exam at this time. - Continue pharmacy substitution for inhalers - Albuterol  breathing  treatments as needed  Hyperlipidemia -  Continue pravastatin   Hypothyroidism - Continue levothyroxine    OSA - Continue BiPAP nightly  GERD - Continue Protonix   Obesity, class I BMI 34.96 kg/m.  DVT prophylaxis: Advance Care Planning:   Code Status: Full Code    Consults: Cardiology  Family Communication:  Severity of Illness: The appropriate patient status for this patient is INPATIENT. Inpatient status is judged to be reasonable and necessary in order to provide the required intensity of service to ensure the patient's safety. The patient's presenting symptoms, physical exam findings, and initial radiographic and laboratory data in the context of their chronic comorbidities is felt to place them at high risk for further clinical deterioration. Furthermore, it is not anticipated that the patient will be medically stable for discharge from the hospital within 2 midnights of admission.   * I certify that at the point of admission it is my clinical judgment that the patient will require inpatient hospital care spanning beyond 2 midnights from the point of admission due to high intensity of service, high risk for further deterioration and high frequency of surveillance required.*  Author: Maximino DELENA Sharps, MD 08/03/2024 11:10 AM  For on call review [email or web address].     [1]  Allergies Allergen Reactions   Injectafer [Ferric Carboxymaltose] Shortness Of Breath    Mild SOB following injectafer infusion    Penicillins Anaphylaxis and Rash   Accupril [Quinapril Hcl] Other (See Comments)    Hyperkalemia=high potassium level   Celebrex [Celecoxib] Other (See Comments)    Hx hematemesis, bleeding ulcer   Cozaar  [Losartan  Potassium] Itching   Dml Forte Rash   Entresto [Sacubitril-Valsartan] Other (See Comments)    Hyperkalemia   Flonase  [Fluticasone ] Other (See Comments)    Hx nasal ulcer   Lactose Intolerance (Gi) Diarrhea   Lipitor [Atorvastatin ] Other (See Comments)     Myalgias    Nasonex  [Mometasone  Furoate] Other (See Comments)    Epistaxis    Nsaids Other (See Comments)    Hx hematemesis,  bleeding ulcer   Advil [Ibuprofen] Nausea And Vomiting    Hx hematemesis, bleeding ulcer   Baking Soda-Fluoride [Sodium Fluoride] Itching and Rash   Bayer Aspirin  [Aspirin ] Other (See Comments)    Hx hematemesis, bleeding ulcer   Chamomile Hives   Clindamycin /Lincomycin Rash   Codeine Nausea And Vomiting   Lasix  [Furosemide ] Other (See Comments)    Tinnitus  Hypotension   Miralax  [Polyethylene Glycol (Macrogol)] Other (See Comments)    Leaky gut syndrome   Quinine Rash   Reclast  [Zoledronic  Acid] Rash and Other (See Comments)    Tremors    Rifadin [Rifampin] Other (See Comments)    Unknown reaction   Singulair  [Montelukast ] Other (See Comments)    Dizziness    Sulfa Antibiotics Rash   Ultram  [Tramadol ] Anxiety and Rash   Vesicare  [Solifenacin ] Other (See Comments)    Urinary retention   Wellbutrin  [Bupropion ] Other (See Comments)    Myalgias  Skin crawling sensation   Wool Alcohol  [Lanolin] Rash

## 2024-08-03 NOTE — ED Notes (Signed)
 Pt given diet coke and sandwich bag.

## 2024-08-03 NOTE — ED Provider Notes (Signed)
 [place] EMERGENCY DEPARTMENT AT [facility] Provider Note   CSN: [id number] Arrival date & time: 08/02/24  8078     Patient presents with: Shortness of Breath, Leg Swelling, and Chest Pain   Tanya Hogan is a 73 y.o. female.   73 year old female history of CHF (EF 20 to 25%) who presents emergency department lower extremity swelling and shortness of breath.  Says that on the fourth she started experiencing swelling of her legs.  Says that she is, more short of breath.  Has gained approximately 10 pounds.  Also has had a mild cough.  No fevers or chills.  No runny nose or sore throat.  Called Hogan yesterday who told her to come into the emergency department.  Says she has been compliant with her torsemide  and spironolactone .       Prior to Admission medications  Medication Sig Start Date End Date Taking? Authorizing Provider  albuterol  (PROVENTIL ) (2.5 MG/3ML) 0.083% nebulizer solution Take 2.5 mg by nebulization daily as needed for wheezing or shortness of breath.   Yes [provider]  albuterol  (VENTOLIN  HFA) 108 (90 Base) MCG/ACT inhaler Inhale 2 puffs into the lungs every 6 (six) hours as needed for wheezing or shortness of breath. 07/24/24  Yes [name], MD  alendronate  (FOSAMAX ) 70 MG tablet TAKE 1 TABLET BY MOUTH EVERY 7  DAYS WITH FULL GLASS OF WATER  ON AN EMPTY STOMACH 05/26/24  Yes [name], MD  amiodarone  (PACERONE ) 200 MG tablet Take 1 tablet (200 mg total) by mouth daily. 07/03/24  Yes [name], MD  aspirin  81 MG chewable tablet Chew 81 mg by mouth daily.   Yes [provider]  Calcium  Carbonate-Vit D-Min (CALCIUM  600+D PLUS MINERALS) 600-400 MG-UNIT TABS 1 tab p.o. twice daily Patient taking differently: Take 2 tablets by mouth daily in the afternoon. 11/02/23  Yes Curtis Debby PARAS, MD  EPINEPHrine  0.3 mg/0.3 mL IJ SOAJ injection Inject 0.3 mg into the muscle as needed for anaphylaxis. 08/27/23   Yes [name], MD  fexofenadine  (ALLEGRA ) 180 MG tablet Take 180 mg by mouth daily.   Yes [provider]  fluticasone -salmeterol (WIXELA INHUB ) 250-50 MCG/ACT AEPB Inhale 1 puff into the lungs in the morning and at bedtime.   Yes [provider]  isosorbide  mononitrate (IMDUR ) 30 MG 24 hr tablet TAKE 1 TABLET BY MOUTH DAILY 05/26/24  Yes Pietro Redell GORMAN, MD  levothyroxine  (SYNTHROID ) 112 MCG tablet TAKE 1 TABLET BY MOUTH DAILY 07/05/24  Yes [name], MD  metFORMIN  (GLUCOPHAGE -XR) 500 MG 24 hr tablet TAKE 1 TABLET BY MOUTH TWICE  DAILY WITH MEALS Patient taking differently: Take 500 mg by mouth daily. 04/19/24  Yes [name], MD  metoprolol  succinate (TOPROL -XL) 50 MG 24 hr tablet Take 1 tablet (50 mg total) by mouth 2 (two) times daily. Take with or immediately following a meal. 06/27/24  Yes Monge, Damien BROCKS, NP  Multiple Vitamins-Minerals (MULTIVITAMIN WOMEN 50+) TABS Take 1 tablet by mouth daily.   Yes [provider]  pantoprazole  (PROTONIX ) 40 MG tablet TAKE 1 TABLET BY MOUTH DAILY  BEFORE BREAKFAST 10/20/22  Yes [name], MD  pravastatin  (PRAVACHOL ) 40 MG tablet Take 40 mg by mouth at bedtime. 01/26/24  Yes [provider]  quiNIDine sulfate  200 MG tablet Take 1 tablet (200 mg total) by mouth 3 (three) times daily. 06/07/24  Yes Ollis, Daphne CROME, NP  SPIRIVA  RESPIMAT 2.5 MCG/ACT AERS Inhale 2 puffs  into the lungs daily. 06/21/24  Yes [provider]  spironolactone  (ALDACTONE ) 25 MG tablet Take 0.5 tablets (12.5 mg total) by mouth daily. 06/08/24 09/06/24 Yes Pietro Redell RAMAN, MD  torsemide  (DEMADEX ) 20 MG tablet TAKE 1 TABLET BY MOUTH DAILY 05/26/24  Yes Pietro Redell RAMAN, MD  valACYclovir  (VALTREX ) 1000 MG tablet Take 1 tablet (1,000 mg total) by mouth 2 (two) times daily. Patient taking differently: Take 1,000 mg by mouth daily as needed (fever blisters). 05/17/23  Yes Alvia Bring, DO  carvedilol   (COREG ) 3.125 MG tablet Take 3.125 mg by mouth 2 (two) times daily with a meal. Patient not taking: Reported on 08/03/2024    [provider]  estradiol (ESTRACE) 0.01 % CREA vaginal cream Place 2 g vaginally. Patient not taking: Reported on 08/03/2024 06/26/24   [provider]  glucose blood test strip To be used to check blood glucose Dx:E11.8 FreeStyle Precision blood glucose for Freestyle Libre 2 05/02/24   [name], MD  lubiprostone  (AMITIZA ) 24 MCG capsule Take 1 capsule (24 mcg total) by mouth every other day. Patient reports provider has decreased to every three days. Patient not taking: Reported on 07/27/2024 07/24/24   [name], MD  mexiletine (MEXITIL ) 150 MG capsule Take 150 mg by mouth. Patient not taking: Reported on 08/03/2024    [provider]  potassium chloride  (MICRO-K ) 10 MEQ CR capsule Take 10 mEq by mouth daily. Patient not taking: Reported on 08/03/2024    [provider]  triamcinolone  (KENALOG ) 0.1 % paste     [provider]  Vibegron (GEMTESA) 75 MG TABS Take 75 mg by mouth daily. Patient not taking: Reported on 08/03/2024    [provider]    Allergies: Injectafer [ferric carboxymaltose], Penicillins, Accupril [quinapril hcl], Celebrex [celecoxib], Cozaar  [losartan  potassium], Dml forte, Entresto [sacubitril-valsartan], Flonase  [fluticasone ], Lactose intolerance (gi), Lipitor [atorvastatin ], Nasonex  [mometasone  furoate], Nsaids, Advil [ibuprofen], Baking soda-fluoride [sodium fluoride], Bayer aspirin  [aspirin ], Chamomile, Clindamycin /lincomycin, Codeine, Lasix  [furosemide ], Miralax  [polyethylene glycol (macrogol)], Quinine, Reclast  [zoledronic  acid], Rifadin [rifampin], Singulair  [montelukast ], Sulfa antibiotics, Ultram  [tramadol ], Vesicare  [solifenacin ], Wellbutrin  [bupropion ], and Wool alcohol  [lanolin]    Review of Systems  Updated Vital Signs BP 106/77 (BP Location: Right Arm)   Pulse  90   Temp 97.6 F (36.4 C) (Oral)   Resp 17   Ht 5' 1 (1.549 m)   Wt 83.9 kg   SpO2 100%   BMI 34.96 kg/m   Physical Exam Vitals and nursing note reviewed.  Constitutional:      General: She is not in acute distress.    Appearance: She is well-developed.  HENT:     Head: Normocephalic and atraumatic.     Right Ear: External ear normal.     Left Ear: External ear normal.     Nose: Nose normal.  Eyes:     Extraocular Movements: Extraocular movements intact.     Conjunctiva/sclera: Conjunctivae normal.     Pupils: Pupils are equal, round, and reactive to light.  Cardiovascular:     Rate and Rhythm: Normal rate and regular rhythm.     Heart sounds: No murmur heard. Pulmonary:     Effort: Pulmonary effort is normal. No respiratory distress.     Breath sounds: Normal breath sounds.  Musculoskeletal:     Cervical back: Normal range of motion and neck supple.     Right lower leg: Edema (Trace) present.     Left lower leg: Edema (Trace) present.  Skin:  General: Skin is warm and dry.  Neurological:     Mental Status: She is alert and oriented to person, place, and time. Mental status is at baseline.  Psychiatric:        Mood and Affect: Mood normal.     (all labs ordered are listed, but only abnormal results are displayed) Labs Reviewed  BASIC METABOLIC PANEL WITH GFR - Abnormal; Notable for the following components:      Result Value   Potassium 3.2 (*)    Glucose, Bld 109 (*)    GFR, Estimated 59 (*)    All other components within normal limits  BRAIN NATRIURETIC PEPTIDE - Abnormal; Notable for the following components:   B Natriuretic Peptide 1,744.7 (*)    All other components within normal limits  TROPONIN I (HIGH SENSITIVITY) - Abnormal; Notable for the following components:   Troponin I (High Sensitivity) 19 (*)    All other components within normal limits  TROPONIN I (HIGH SENSITIVITY) - Abnormal; Notable for the following components:   Troponin I (High  Sensitivity) 21 (*)    All other components within normal limits  CBC    EKG: EKG Interpretation Date/Time:  Thursday August 03 2024 09:22:59 EST Ventricular Rate:  89 PR Interval:  165 QRS Duration:  151 QT Interval:  443 QTC Calculation: 540 R Axis:   161  Text Interpretation: AV dual paced rhythm Confirmed by Yolande Charleston [phone]) on 08/03/2024 9:33:14 AM  Radiology: [facility] Chest 2 View Result Date: 08/02/2024 CLINICAL DATA:  Chest pressure. EXAM: CHEST - 2 VIEW COMPARISON:  Chest radiograph dated 06/05/2024 FINDINGS: There is cardiomegaly with vascular congestion. No focal consolidation, pleural effusion, pneumothorax. Left pectoral AICD device. No acute osseous pathology. Right shoulder arthroplasty. IMPRESSION: Cardiomegaly with vascular congestion. No focal consolidation. Electronically Signed   By: Vanetta Chou M.D.   On: 08/02/2024 20:37     Procedures   Medications Ordered in the ED  sodium chloride  flush (NS) 0.9 % injection 3 mL (3 mLs Intravenous Not Given 08/03/24 1134)  sodium chloride  flush (NS) 0.9 % injection 3 mL (has no administration in time range)  0.9 %  sodium chloride  infusion (has no administration in time range)  acetaminophen  (TYLENOL ) tablet 650 mg (has no administration in time range)  ondansetron  (ZOFRAN ) injection 4 mg (has no administration in time range)  enoxaparin  (LOVENOX ) injection 40 mg (40 mg Subcutaneous Given 08/03/24 1230)  bumetanide  (BUMEX ) injection 1 mg (has no administration in time range)  levothyroxine  (SYNTHROID ) tablet 112 mcg (has no administration in time range)  metoprolol  succinate (TOPROL -XL) 24 hr tablet 50 mg (has no administration in time range)  quiNIDine sulfate  tablet 200 mg (has no administration in time range)  amiodarone  (PACERONE ) tablet 200 mg (has no administration in time range)  pantoprazole  (PROTONIX ) EC tablet 40 mg (has no administration in time range)  fluticasone  furoate-vilanterol (BREO ELLIPTA )  200-25 MCG/ACT 1 puff (has no administration in time range)  Tiotropium Bromide  AERS 2 puff (has no administration in time range)  loratadine  (CLARITIN ) tablet 10 mg (has no administration in time range)  potassium chloride  SA (KLOR-CON  M) CR tablet 40 mEq (40 mEq Oral Given 08/03/24 0831)  bumetanide  (BUMEX ) injection 1.5 mg (1.5 mg Intravenous Given 08/03/24 1010)    Clinical Course as of 08/03/24 1313  Thu Aug 03, 2024  0918 Discussed with Tanya Hogan [RP]  1050 Tanya Hogan will see the patient.  Recommends admission to hospitalist for heart failure exacerbation and they  will see the patient in consultation. [RP]  1112 Discussed with Tanya Hogan for admission. [RP]    Clinical Course User Index [RP] Yolande [name] BROCKS, MD                                 Medical Decision Making Amount and/or Complexity of Data Reviewed Labs: ordered. Radiology: ordered.  Risk Prescription drug management. Decision regarding hospitalization.   CHARNELL PEPLINSKI is a 73 year old female history of CHF (20 to 25%) who presents emergency department with leg showing shortness of breath  Initial Ddx:  Heart failure exacerbation, renal failure, medication noncompliance  MDM/Course:  Patient presents emergency department with lower extremity swelling and shortness of breath.  Appears to be bilateral.  She reports she is gaining weight as well despite being compliant with her medications, fluid, and sodium restrictions.  On exam is not in acute distress.  Satting well on room air.  Does have some mild lower extremity edema.  Chest x-ray with cardiomegaly with vascular congestion.  BNP elevated to 1700.  Did discuss with Hogan who recommends admission since she appears to be gaining weight despite her outpatient treatment.  Recommends hospitalist admission and they will see the patient in consultation.  Patient given Bumex  prior to admit  This patient presents to the ED for concern of complaints  listed in HPI, this involves an extensive number of treatment options, and is a complaint that carries with it a high risk of complications and morbidity. Disposition including potential need for admission considered.   Dispo: Admit to Floor  Records reviewed Outpatient Clinic Notes The following labs were independently interpreted: Chemistry and show no acute abnormality I independently reviewed the following imaging with scope of interpretation limited to determining acute life threatening conditions related to emergency care: Chest x-ray and agree with the radiologist interpretation with the following exceptions: none I personally reviewed and interpreted cardiac monitoring: normal sinus rhythm  I personally reviewed and interpreted the pt's EKG: see above for interpretation  I have reviewed the patients home medications and made adjustments as needed Consults: Hogan Social Determinants of health:  Geriatric  Portions of this note were generated with Scientist, clinical (histocompatibility and immunogenetics). Dictation errors may occur despite best attempts at proofreading.     Final diagnoses:  Acute on chronic congestive heart failure, unspecified heart failure type [facility])    ED Discharge Orders     None          Yolande [name] BROCKS, MD 08/03/24 1313

## 2024-08-03 NOTE — ED Notes (Addendum)
 Pt reporting increasing SOB, 02 saturation at 92% on RA, 2L [place] applied. 100% on 2L

## 2024-08-03 NOTE — Consult Note (Addendum)
 Cardiology Consultation  Patient ID: Tanya Hogan MRN: [id number]; DOB: Apr 08, 1951  Admit date: 08/02/2024 Date of Consult: 08/03/2024  PCP:  [name], MD   [facility] Providers Cardiologist:  Redell Shallow, MD  Electrophysiologist:  [name], MD    Patient Profile: Tanya Hogan is a 73 y.o. female with a hx of chronic systolic heart failure, VT/PVCs s/p upgrade to CRT-D , mitral valve regurgitation, hypertension, type 2 diabetes, hypothyroidism, pulmonary hypertension, NASH, OSA on BiPAP, fibromyalgia, IBS, and GERD  who is being seen 08/03/2024 for the evaluation of CHF exacerbation, chest pain at the request of Dr. Yolande.  History of Present Illness: Tanya Hogan has past medical history as stated above.  She presented to the [facility] emergency department on 08/02/2024 with complaints of increased lower extremity swelling, weight gain, shortness of breath, chest pain.  Relevant workup since in the ED includes: BNP elevated at 1,744, troponin mildly elevated and flat 19 ? 21, CBC normal, BMP shows hypokalemia with potassium at 3.2.  CXR showed cardiomegaly with vascular congestion.  She is followed closely as an outpatient with our general cardiology as well as electrophysiology team.  She was last seen by Damien Braver, NP on 06/27/2024 for follow-up.  At this appointment she reported no significant issues with her heart failure symptoms at this time.  She had some generalized fatigue and decreased exercise tolerance but denied any palpitations, dizziness, chest pain, dyspnea, edema, orthopnea.  Her medication regimen at this time included: Amiodarone  200 mg daily, clonidine 200 mg 3 times daily, aspirin  81 mg daily, Imdur  30 mg daily, Toprol  50 mg twice daily, torsemide  20 mg daily with potassium 10 mEq daily, pravastatin  40 mg daily, spironolactone  12.5 mg daily.  She was noted to be 186 lb at this appointment.  After speaking with the patient,  she agrees with history of stated blood.  She tells me that she has begun feeling short of breath with exertion, noticing increasing lower extremity edema along with weight gain and orthopnea.  She denies any recent sick symptoms, known sick contacts, discussions in her diet, medication changes.  She tells me that she has had issues with previous GDMT in the past.  Notes that she was previously on Entresto, this was stopped in the setting of hyperkalemia.  She has not started on SGLT2 as it was noted that she does not tolerate due to fatigue.  Will discuss with advanced heart failure to ensure that she gets appropriate outpatient follow-up for further evaluation and treatment of CHF as her EF has not improved with CRT-D.  Past Medical History:  Diagnosis Date   Alkaline phosphatase elevation    Asthma    Breast cancer (HCC) 2011   Carpal tunnel syndrome    bilateral   Chronic HFrEF (heart failure with reduced ejection fraction) (HCC)    Cirrhosis, non-alcoholic (HCC)    Closed fracture of unspecified part of radius (alone)    DDD (degenerative disc disease), lumbar    Depression    pt denies   Diabetes mellitus without complication (HCC)    Enlarged heart    Fibromyalgia    GERD (gastroesophageal reflux disease)    History of kidney stones    HTN (hypertension)    Hypothyroidism    IBS (irritable bowel syndrome)    LBBB (left bundle branch block)    Microcytic anemia 05/14/2017   Mitral regurgitation    NICM (nonischemic cardiomyopathy) (HCC)    Pneumonia  Presence of permanent cardiac pacemaker    Pulmonary hypertension (HCC)    PVD (peripheral vascular disease)    2019   Sleep apnea    uses cpap   Tricuspid regurgitation    Ventricular tachyarrhythmia [facility])    Past Surgical History:  Procedure Laterality Date   BIV ICD GENERATOR CHANGEOUT N/A 05/31/2024   Procedure: BIV ICD GENERATOR CHANGEOUT;  Surgeon: Almetta Donnice LABOR, MD;  Location: [facility] INVASIVE CV LAB;  Service:  Cardiovascular;  Laterality: N/A;   BIV PACEMAKER GENERATOR CHANGEOUT N/A 11/03/2021   Procedure: BIV PACEMAKER GENERATOR CHANGEOUT;  Surgeon: Waddell Danelle ORN, MD;  Location: [facility] INVASIVE CV LAB;  Service: Cardiovascular;  Laterality: N/A;   BREAST LUMPECTOMY Left 2011   COLONOSCOPY     FOOT SURGERY     KNEE SURGERY     LEAD INSERTION N/A 05/31/2024   Procedure: LEAD INSERTION;  Surgeon: Almetta Donnice LABOR, MD;  Location: [facility] INVASIVE CV LAB;  Service: Cardiovascular;  Laterality: N/A;   LEAD REVISION/REPAIR N/A 11/03/2021   Procedure: LEAD REVISION/REPAIR;  Surgeon: Waddell Danelle ORN, MD;  Location: [facility] INVASIVE CV LAB;  Service: Cardiovascular;  Laterality: N/A;   MASTECTOMY Right    05/2019   PACEMAKER GENERATOR CHANGE N/A 08/23/2014   Procedure: PACEMAKER GENERATOR CHANGE;  Surgeon: Danelle ORN Waddell, MD;  Location: [facility] CATH LAB;  Service: Cardiovascular;  Laterality: N/A;   PACEMAKER PLACEMENT  2009   [facility]   RECTAL SURGERY  1976   REDUCTION MAMMAPLASTY Left    05/2019   RIGHT/LEFT HEART CATH AND CORONARY ANGIOGRAPHY N/A 05/18/2024   Procedure: RIGHT/LEFT HEART CATH AND CORONARY ANGIOGRAPHY;  Surgeon: Wendel Lurena POUR, MD;  Location: [facility] INVASIVE CV LAB;  Service: Cardiovascular;  Laterality: N/A;   TOTAL SHOULDER ARTHROPLASTY Right 03/10/2018   TOTAL SHOULDER ARTHROPLASTY Right 03/10/2018   Procedure: RIGHT TOTAL SHOULDER ARTHROPLASTY;  Surgeon: Dozier Soulier, MD;  Location: [facility] OR;  Service: Orthopedics;  Laterality: Right;   TUBAL LIGATION     UPPER EXTREMITY VENOGRAPHY  05/18/2024   Procedure: UPPER EXTREMITY VENOGRAPHY;  Surgeon: Wendel Lurena POUR, MD;  Location: [facility] INVASIVE CV LAB;  Service: Cardiovascular;;    Home Medications:  Prior to Admission medications  Medication Sig Start Date End Date Taking? Authorizing Provider  albuterol  (PROVENTIL ) (2.5 MG/3ML) 0.083% nebulizer solution Take 2.5 mg by nebulization every 6 (six) hours as needed for wheezing or shortness of  breath.    [provider]  albuterol  (VENTOLIN  HFA) 108 (90 Base) MCG/ACT inhaler Inhale 2 puffs into the lungs every 6 (six) hours as needed for wheezing or shortness of breath. 07/24/24   [name], MD  alendronate  (FOSAMAX ) 70 MG tablet TAKE 1 TABLET BY MOUTH EVERY 7  DAYS WITH FULL GLASS OF WATER  ON AN EMPTY STOMACH 05/26/24   [name], MD  amiodarone  (PACERONE ) 200 MG tablet Take 1 tablet (200 mg total) by mouth daily. 07/03/24   [name], MD  aspirin  81 MG chewable tablet Chew 81 mg by mouth daily.    [provider]  Calcium  Carbonate-Vit D-Min (CALCIUM  600+D PLUS MINERALS) 600-400 MG-UNIT TABS 1 tab p.o. twice daily 11/02/23   Curtis Debby PARAS, MD  Continuous Glucose Receiver (FREESTYLE LIBRE 3 READER) DEVI Use for continuous glucose monitoring 04/10/24   [name], MD  Continuous Glucose Sensor (FREESTYLE LIBRE 3 SENSOR) MISC by Does not apply route. Change every 14 days    [provider]  EPINEPHrine  0.3 mg/0.3 mL IJ  SOAJ injection Inject 0.3 mg into the muscle as needed for anaphylaxis. 08/27/23   [name], MD  fexofenadine  (ALLEGRA ) 180 MG tablet Take 180 mg by mouth daily.    [provider]  fluticasone -salmeterol (WIXELA INHUB ) 250-50 MCG/ACT AEPB Inhale 1 puff into the lungs in the morning and at bedtime.    [provider]  glucose blood test strip To be used to check blood glucose Dx:E11.8 FreeStyle Precision blood glucose for Freestyle Libre 2 05/02/24   [name], MD  isosorbide  mononitrate (IMDUR ) 30 MG 24 hr tablet TAKE 1 TABLET BY MOUTH DAILY 05/26/24   Pietro Redell RAMAN, MD  levothyroxine  (SYNTHROID ) 112 MCG tablet TAKE 1 TABLET BY MOUTH DAILY 07/05/24   [name], MD  lubiprostone  (AMITIZA ) 24 MCG capsule Take 1 capsule (24 mcg total) by mouth every other day. Patient reports provider has decreased to every three days. Patient not taking: Reported  on 07/27/2024 07/24/24   [name], MD  metFORMIN  (GLUCOPHAGE -XR) 500 MG 24 hr tablet TAKE 1 TABLET BY MOUTH TWICE  DAILY WITH MEALS Patient taking differently: Take 500 mg by mouth daily. Changed per conversation with Dr Alvan via secure chat 07/27/24 - hold with return of diarrhea and call office 04/19/24   [name], MD  metoprolol  succinate (TOPROL -XL) 50 MG 24 hr tablet Take 1 tablet (50 mg total) by mouth 2 (two) times daily. Take with or immediately following a meal. 06/27/24   Monge, Damien BROCKS, NP  Multiple Vitamins-Minerals (MULTIVITAMIN WOMEN 50+) TABS Take 1 tablet by mouth daily.    [provider]  pantoprazole  (PROTONIX ) 40 MG tablet TAKE 1 TABLET BY MOUTH DAILY  BEFORE BREAKFAST 10/20/22   [name], MD  pravastatin  (PRAVACHOL ) 40 MG tablet Take 40 mg by mouth daily. 01/26/24   [provider]  quiNIDine sulfate  200 MG tablet Take 1 tablet (200 mg total) by mouth 3 (three) times daily. 06/07/24   Aniceto Daphne CROME, NP  SPIRIVA  RESPIMAT 2.5 MCG/ACT AERS Inhale 2 puffs into the lungs daily. 06/21/24   [provider]  spironolactone  (ALDACTONE ) 25 MG tablet Take 0.5 tablets (12.5 mg total) by mouth daily. 06/08/24 09/06/24  Pietro Redell RAMAN, MD  torsemide  (DEMADEX ) 20 MG tablet TAKE 1 TABLET BY MOUTH DAILY 05/26/24   Pietro Redell RAMAN, MD  valACYclovir  (VALTREX ) 1000 MG tablet Take 1 tablet (1,000 mg total) by mouth 2 (two) times daily. Patient taking differently: Take 1,000 mg by mouth as needed. 05/17/23   Alvia Bring, DO   Scheduled Meds:  enoxaparin  (LOVENOX ) injection  40 mg Subcutaneous Q24H   sodium chloride  flush  3 mL Intravenous Q12H   Continuous Infusions:  sodium chloride      PRN Meds: sodium chloride , acetaminophen , ondansetron  (ZOFRAN ) IV, sodium chloride  flush Allergies:   Allergies[1]  Social History:   Social History   Socioeconomic History   Marital status: Widowed    Spouse name: Not on file   Number  of children: 2   Years of education: 14   Highest education level: Associate degree: academic program  Occupational History   Occupation: quality insurcance in sports coach    Comment: retired  Tobacco Use   Smoking status: Former    Current packs/day: 0.00    Types: Cigarettes    Quit date: 08/24/1966    Years since quitting: 57.9   Smokeless tobacco: Never  Vaping Use   Vaping status: Never Used  Substance and Sexual Activity  Alcohol  use: No   Drug use: No   Sexual activity: Not Currently  Other Topics Concern   Not on file  Social History Narrative   Retired. Lives with and helps her brother. Likes risk manager, nutritional therapist and music. Loves animals.   Social Drivers of Health   Tobacco Use: Medium Risk (08/02/2024)   Patient History    Smoking Tobacco Use: Former    Smokeless Tobacco Use: Never    Passive Exposure: Not on file  Financial Resource Strain: Low Risk (07/07/2024)   Received from [facility]   Overall Financial Resource Strain (CARDIA)    How hard is it for you to pay for the very basics like food, housing, medical care, and heating?: Not hard at all  Food Insecurity: No Food Insecurity (07/17/2024)   Epic    Worried About Programme Researcher, Broadcasting/film/video in the Last Year: Never true    Ran Out of Food in the Last Year: Never true  Transportation Needs: Unmet Transportation Needs (07/17/2024)   Epic    Lack of Transportation (Medical): Yes    Lack of Transportation (Non-Medical): Yes  Physical Activity: Sufficiently Active (08/10/2023)   Exercise Vital Sign    Days of Exercise per Week: 3 days    Minutes of Exercise per Session: 50 min  Recent Concern: Physical Activity - Insufficiently Active (07/12/2023)   Exercise Vital Sign    Days of Exercise per Week: 1 day    Minutes of Exercise per Session: 10 min  Stress: No Stress Concern Present (06/22/2024)   Received from [facility] - Occupational Stress  Questionnaire    Do you feel stress - tense, restless, nervous, or anxious, or unable to sleep at night because your mind is troubled all the time - these days?: Not at all  Social Connections: Moderately Integrated (07/13/2024)   Social Connection and Isolation Panel    Frequency of Communication with Friends and Family: More than three times a week    Frequency of Social Gatherings with Friends and Family: More than three times a week    Attends Religious Services: More than 4 times per year    Active Member of Clubs or Organizations: Yes    Attends Banker Meetings: More than 4 times per year    Marital Status: Divorced  Intimate Partner Violence: Not At Risk (07/17/2024)   Epic    Fear of Current or Ex-Partner: No    Emotionally Abused: No    Physically Abused: No    Sexually Abused: No  Depression (PHQ2-9): Low Risk (07/24/2024)   Depression (PHQ2-9)    PHQ-2 Score: 4  Alcohol  Screen: Low Risk (08/10/2023)   Alcohol  Screen    Last Alcohol  Screening Score (AUDIT): 0  Housing: Low Risk (07/17/2024)   Epic    Unable to Pay for Housing in the Last Year: No    Number of Times Moved in the Last Year: 0    Homeless in the Last Year: No  Utilities: Not At Risk (07/17/2024)   Epic    Threatened with loss of utilities: No  Recent Concern: Utilities - At Risk (07/07/2024)   Received from [facility]    In the past 12 months has the electric, gas, oil, or water  company threatened to shut off services in your home?: Yes  Health Literacy: Adequate Health Literacy (08/10/2023)   B1300 Health Literacy    Frequency of need for help  with medical instructions: Never    Family History:   Family History  Problem Relation Age of Onset   Heart disease Mother    Melanoma Mother    Parkinson's disease Mother    Heart disease Father    Heart disease Brother    Heart failure Other    Breast cancer Other    Hypertension Other    ROS:  Please see the history of present  illness.  All other ROS reviewed and negative.     Physical Exam/Data: Vitals:   08/03/24 0039 08/03/24 0400 08/03/24 0848 08/03/24 1030  BP: 96/62 115/76 106/77   Pulse: 88 89 90 88  Resp: 17 15 19  (!) 28  Temp: 97.7 F (36.5 C) 97.9 F (36.6 C) (!) 97.5 F (36.4 C)   TempSrc: Oral Oral Oral   SpO2: 95% 98% 96% 99%  Weight:      Height:       No intake or output data in the 24 hours ending 08/03/24 1141    08/02/2024    7:37 PM 07/27/2024    2:29 PM 07/24/2024    2:45 PM  Last 3 Weights  Weight (lbs) 185 lb 187 lb 189 lb  Weight (kg) 83.915 kg 84.823 kg 85.73 kg     Body mass index is 34.96 kg/m.   General:  in no acute distress, sitting up in chair in room HEENT: normal Neck: + JVD Vascular: Distal pulses 2+ bilaterally Cardiac:  RRR Lungs: Soft crackles on left side Abd: soft, nontender, no hepatomegaly  Ext: 2+ edema Musculoskeletal:  No deformities Skin: warm and dry  Neuro:  no focal abnormalities noted Psych:  Normal affect   EKG:  The EKG was personally reviewed and demonstrates: AV dual paced rhythm  Relevant CV Studies:  Remote device check, 08/03/2024 ICD: Scheduled remote reviewed. Normal device function.  Presenting rhythm: AP/VP  32 NSVT, V>A, V-rate 125-167 bpm, duration 1-2 sec   Right/left heart cath, 05/18/2024 Normal right dominant circulation with no obstructive coronary artery disease. Venography from the left axillary demonstrated that the subclavian vein proximal to the insertion site of multiple leads is patent however a wire and catheter could not cross the this confluence. Fick cardiac output of 5.7 L/min and Fick cardiac index of 3.1 L/min/m with following hemodynamics: Right atrial pressure mean of 8 mmHg Right ventricular pressure 52/1 with an end-diastolic pressure of 10 mmHg Wedge pressure mean of 23 mmHg with V waves to 29 mmHg PA pressure of 51/27 with mean of 37 mmHg PVR of PVR of 2.5 Woods units PA pulsatility index of  3 LVEDP of 21 to 24 mmHg across the cardiac cycle   Recommendation: Medical therapy  Echocardiogram, 05/13/2024 No apical thrombus with Definity  contrast. Left ventricular ejection fraction, by estimation, is 20 to 25% . Left ventricular ejection fraction by 2D MOD biplane is 25. 1 % . The left ventricle has severely decreased function. The left ventricle demonstrates global hypokinesis. The left ventricular internal cavity size was moderately dilated. Left ventricular diastolic parameters are consistent with Grade II diastolic dysfunction ( pseudonormalization) . Elevated left ventricular end- diastolic pressure.  Right ventricular systolic function is normal. The right ventricular size is moderately enlarged. There is severely elevated pulmonary artery systolic pressure. The estimated right ventricular systolic pressure is 64. 6 mmHg.  Left atrial size was severely dilated.  Right atrial size was severely dilated.  The mitral valve is degenerative. Mild mitral valve regurgitation. Moderate mitral annular calcification.  The  tricuspid valve is abnormal. Tricuspid valve regurgitation is mild to moderate.  The aortic valve is tricuspid. Aortic valve regurgitation is not visualized. Aortic valve sclerosis/ calcification is present, without any evidence of aortic stenosis.  The inferior vena cava is dilated in size with < 50% respiratory variability, suggesting right atrial pressure of 15 mmHg.  Comparison( s) : Changes from prior study are noted. 3/ 8/ 2024: LVEF 20- 25% , global HK.  Laboratory Data: High Sensitivity Troponin:   Recent Labs  Lab 07/13/24 1430 07/13/24 1720 08/02/24 1942 08/02/24 2142  TROPONINIHS 21* 20* 19* 21*     Chemistry Recent Labs  Lab 08/02/24 1942  NA 139  K 3.2*  CL 102  CO2 27  GLUCOSE 109*  BUN 20  CREATININE 0.99  CALCIUM  9.8  GFRNONAA 59*  ANIONGAP 10    No results for input(s): PROT, ALBUMIN, AST, ALT, ALKPHOS, BILITOT in the last  168 hours. Lipids No results for input(s): CHOL, TRIG, HDL, LABVLDL, LDLCALC, CHOLHDL in the last 168 hours.  Hematology Recent Labs  Lab 08/02/24 1942  WBC 7.7  RBC 3.92  HGB 12.2  HCT 39.0  MCV 99.5  MCH 31.1  MCHC 31.3  RDW 15.2  PLT 231   Thyroid  No results for input(s): TSH, FREET4 in the last 168 hours.  BNP Recent Labs  Lab 08/02/24 1942  BNP 1,744.7*    DDimer No results for input(s): DDIMER in the last 168 hours.  Radiology/Studies:  CUP PACEART REMOTE DEVICE CHECK Result Date: 08/03/2024 ICD: Scheduled remote reviewed. Normal device function.  Presenting rhythm: AP/VP 32 NSVT, V>A, V-rate 125-167 bpm, duration 1-2 sec Next remote transmission per protocol. ML, CVRS  DG Chest 2 View Result Date: 08/02/2024 CLINICAL DATA:  Chest pressure. EXAM: CHEST - 2 VIEW COMPARISON:  Chest radiograph dated 06/05/2024 FINDINGS: There is cardiomegaly with vascular congestion. No focal consolidation, pleural effusion, pneumothorax. Left pectoral AICD device. No acute osseous pathology. Right shoulder arthroplasty. IMPRESSION: Cardiomegaly with vascular congestion. No focal consolidation. Electronically Signed   By: Vanetta Chou M.D.   On: 08/02/2024 20:37   Assessment and Plan:  Acute on chronic HFrEF Nonischemic cardiomyopathy s/p Medtronic CRT-D Hypertension Home meds: Toprol  50 mg twice daily, spironolactone  12.5 mg daily, Imdur  30 mg daily, torsemide  20 mg daily with potassium 10 mEq daily Echocardiogram 04/2024 showed: LVEF 20 to 25%, global hypokinesis, G2 DD, normal RV systolic function, severely elevated PASP at 64.6 mmHg, severe biatrial enlargement, mild MR, moderate MAC, mild to moderate TR, dilated IVC [facility] 04/2024 showed: No obstructive coronary artery disease BNP elevated at 1,744 Dry weight at last appointment 186 pounds Reported being on 191 pounds Presented to ER with complaints of shortness of breath, lower extremity edema, weight gain CXR  showed cardiomegaly, pulmonary vascular congestion Given IV Bumex  1.5 mg IV x 1 dose in the ER along with K supplementation BP stable, renal function normal Start IV diuresis with Bumex  1 mg twice daily Continue to monitor strict I's and O's, daily weights, daily BMPs Monitor and replete electrolytes Continue other home medications as tolerated I will contact advanced heart failure team to ensure appropriate outpatient follow-up as scheduled  Hyperlipidemia Home meds: Pravastatin  40 mg twice daily LDL noted to be 60 in October 2025 Continue home statin  History of monomorphic VT s/p CRT-D Home meds: Toprol  50 mg twice daily, amiodarone  200 mg daily, quinidine 200 mg 3 times daily Follows closely with EP as an outpatient Recent device interrogation showed normal device  function, AP/VP, 32 episodes of NSVT Continue home medications Will continue to follow with electrophysiology as an outpatient  Mild MR, moderate MAC Mild to moderate TR Echo 04/2024 showed: Mild mitral regurgitation, moderate MAC Follow-up echocardiograms  Per primary OSA on BiPAP Hypothyroidism Type 2 diabetes Electrolyte disturbances  Risk Assessment/Risk Scores:      [place]  Heart Association (NYHA) Functional Class NYHA Class III   For questions or updates, please contact [facility] Please consult [email or web address] for contact info under   Signed, Waddell DELENA Donath, PA-C  08/03/2024 11:41 AM     [1]  Allergies Allergen Reactions   Injectafer [Ferric Carboxymaltose] Shortness Of Breath    Mild SOB following injectafer infusion    Penicillins Anaphylaxis and Rash   Accupril [Quinapril Hcl] Other (See Comments)    Hyperkalemia=high potassium level   Celebrex [Celecoxib] Other (See Comments)    Hx hematemesis, bleeding ulcer   Cozaar  [Losartan  Potassium] Itching   Dml Forte Rash   Entresto [Sacubitril-Valsartan] Other (See Comments)    Hyperkalemia   Flonase  [Fluticasone ] Other (See  Comments)    Hx nasal ulcer   Lactose Intolerance (Gi) Diarrhea   Lipitor [Atorvastatin ] Other (See Comments)    Myalgias    Nasonex  [Mometasone  Furoate] Other (See Comments)    Epistaxis    Nsaids Other (See Comments)    Hx hematemesis, bleeding ulcer   Advil [Ibuprofen] Nausea And Vomiting    Hx hematemesis, bleeding ulcer   Baking Soda-Fluoride [Sodium Fluoride] Itching and Rash   Bayer Aspirin  [Aspirin ] Other (See Comments)    Hx hematemesis, bleeding ulcer   Chamomile Hives   Clindamycin /Lincomycin Rash   Codeine Nausea And Vomiting   Lasix  [Furosemide ] Other (See Comments)    Tinnitus  Hypotension   Miralax  [Polyethylene Glycol (Macrogol)] Other (See Comments)    Leaky gut syndrome   Quinine Rash   Reclast  [Zoledronic  Acid] Rash and Other (See Comments)    Tremors    Rifadin [Rifampin] Other (See Comments)    Unknown reaction   Singulair  [Montelukast ] Other (See Comments)    Dizziness    Sulfa Antibiotics Rash   Ultram  [Tramadol ] Anxiety and Rash   Vesicare  [Solifenacin ] Other (See Comments)    Urinary retention   Wellbutrin  [Bupropion ] Other (See Comments)    Myalgias  Skin crawling sensation   Wool Alcohol  [Lanolin] Rash

## 2024-08-03 NOTE — ED Notes (Signed)
 Floor called and made aware of PT arrival

## 2024-08-03 NOTE — Progress Notes (Signed)
 Patient requested dulcolax and either a sleep aid or anti-anxiety agent at 2230; no new orders at this time.

## 2024-08-04 DIAGNOSIS — E66811 Obesity, class 1: Secondary | ICD-10-CM | POA: Diagnosis not present

## 2024-08-04 DIAGNOSIS — G4733 Obstructive sleep apnea (adult) (pediatric): Secondary | ICD-10-CM | POA: Diagnosis not present

## 2024-08-04 DIAGNOSIS — E785 Hyperlipidemia, unspecified: Secondary | ICD-10-CM | POA: Diagnosis not present

## 2024-08-04 DIAGNOSIS — E038 Other specified hypothyroidism: Secondary | ICD-10-CM | POA: Diagnosis not present

## 2024-08-04 DIAGNOSIS — K219 Gastro-esophageal reflux disease without esophagitis: Secondary | ICD-10-CM | POA: Diagnosis not present

## 2024-08-04 DIAGNOSIS — E1169 Type 2 diabetes mellitus with other specified complication: Secondary | ICD-10-CM | POA: Diagnosis not present

## 2024-08-04 LAB — BASIC METABOLIC PANEL WITH GFR
Anion gap: 5 (ref 5–15)
BUN: 21 mg/dL (ref 8–23)
CO2: 34 mmol/L — ABNORMAL HIGH (ref 22–32)
Calcium: 9.4 mg/dL (ref 8.9–10.3)
Chloride: 100 mmol/L (ref 98–111)
Creatinine, Ser: 1.21 mg/dL — ABNORMAL HIGH (ref 0.44–1.00)
GFR, Estimated: 47 mL/min — ABNORMAL LOW (ref >60)
Glucose, Bld: 125 mg/dL — ABNORMAL HIGH (ref 70–99)
Potassium: 4.1 mmol/L (ref 3.5–5.1)
Sodium: 139 mmol/L (ref 135–145)

## 2024-08-04 MED ORDER — BISACODYL 10 MG RE SUPP
10.0000 mg | Freq: Once | RECTAL | Status: AC
  Administered 2024-08-04: 10 mg via RECTAL
  Filled 2024-08-04: qty 1

## 2024-08-04 MED ORDER — BISACODYL 5 MG PO TBEC: 5.0000 mg | DELAYED_RELEASE_TABLET | Freq: Every day | ORAL | Status: DC | PRN

## 2024-08-04 MED ORDER — BUMETANIDE 0.25 MG/ML IJ SOLN
1.0000 mg | Freq: Two times a day (BID) | INTRAMUSCULAR | Status: DC
  Administered 2024-08-04: 1 mg via INTRAVENOUS
  Filled 2024-08-04 (×2): qty 4

## 2024-08-04 MED ORDER — MELATONIN 5 MG PO TABS
5.0000 mg | ORAL_TABLET | Freq: Every evening | ORAL | Status: DC | PRN
  Filled 2024-08-04: qty 1

## 2024-08-04 MED ORDER — ACETAMINOPHEN 500 MG PO TABS
500.0000 mg | ORAL_TABLET | Freq: Once | ORAL | Status: AC
  Administered 2024-08-04: 500 mg via ORAL
  Filled 2024-08-04: qty 1

## 2024-08-04 MED ORDER — SALINE SPRAY 0.65 % NA SOLN: 1.0000 | NASAL | Status: DC | PRN

## 2024-08-04 MED ORDER — BUMETANIDE 0.25 MG/ML IJ SOLN
2.0000 mg | Freq: Two times a day (BID) | INTRAMUSCULAR | Status: DC
  Administered 2024-08-04 – 2024-08-07 (×6): 2 mg via INTRAVENOUS
  Filled 2024-08-04 (×6): qty 8

## 2024-08-04 MED ADMIN — Bisacodyl Tab Delayed Release 5 MG: 5 mg | ORAL | NDC 00904640761

## 2024-08-04 MED FILL — Bisacodyl Tab Delayed Release 5 MG: 5.0000 mg | ORAL | Qty: 1 | Status: AC

## 2024-08-04 NOTE — Assessment & Plan Note (Addendum)
 Echocardiogram with reduced LV systolic function EF 20 to 25%, global hypokinesis, LV cavity with moderate dilatation, grade II diastolic dysfunction with pseudo normalization EA, RV systolic function preserved, RVSP 64.6 mmHg, LA and RA with severe dilatation, mild to moderate tricuspid regurgitation,   Acute on chronic cor pulmonale  Pulmonary hypertension  Urine output 3,100 ml Systolic blood pressure 100 mmHg range   12/13 Metolazone  2.5 mg  Patient transitioned to torsemide  20 mg po daily.  Not candidate to SGLT 2 inh due to risk of urine infections.  Limited medical therapy due to risk of hypotension  History of ventricular tachycardia, continue with amiodarone  200 mg po daily and quinidine

## 2024-08-04 NOTE — Assessment & Plan Note (Signed)
No signs of acute exacerbation. Continue bronchodilator therapy.

## 2024-08-04 NOTE — Progress Notes (Signed)
 Patient is very agitated after being told by a leader during leader rounding  that it is documented that she has a legal guardian. I clarified with patient if she did, and she said no, I then went back to navigator and answer no to the question, but she is still upset and said that should have not been documented in the first place, now she believes we are going to make her bring in paper work and all her property will be taken from her. I explained and educated her that it is not the way the process work but she is still upset.

## 2024-08-04 NOTE — TOC Initial Note (Addendum)
 Transition of Care [facility]) - Initial/Assessment Note    Patient Details  Name: Tanya Hogan MRN: [id number] Date of Birth: 1951/06/10  Transition of Care [facility]) CM/SW Contact:    Waddell Barnie Rama, RN Phone Number: 08/04/2024, 1:37 PM  Clinical Narrative:                 From home with brother , has PCP and insurance on file, states has  [facility] services in place with [facility] for [facility], HHPT, this NCM confirmed with [facility],  patient would like to continue, has [place]/cane, w/chair, bsc and a shower chair and biap at home.  States family member (daughter)  will transport them home at [employer] and family is support system, states gets medications from [facility] on [address].  Pta self ambulatory.   Per pt eval rec HHPT,  she is active with [facility], will need orders to resume [facility] services.   Expected Discharge Plan: Home w Home Health Services Barriers to Discharge: Continued Medical Work up   Patient Goals and CMS Choice Patient states their goals for this hospitalization and ongoing recovery are:: return home CMS Medicare.gov Compare Post Acute Care list provided to:: Patient Choice offered to / list presented to : Patient      Expected Discharge Plan and Services In-house Referral: NA Discharge Planning Services: CM Consult Post Acute Care Choice: Home Health Living arrangements for the past 2 months: Single Family Home                 DME Arranged: N/A DME Agency: NA       HH Arranged: NA          Prior Living Arrangements/Services Living arrangements for the past 2 months: Single Family Home Lives with:: Siblings (brother) Patient language and need for interpreter reviewed:: Yes Do you feel safe going back to the place where you live?: Yes      Need for Family Participation in Patient Care: Yes (Comment) Care giver support system in place?: Yes (comment) Current home services: DME ([place], cane, shower chair, bsc, w/chair) Criminal Activity/Legal Involvement Pertinent to  Current Situation/Hospitalization: No - Comment as needed  Activities of Daily Living   ADL Screening (condition at time of admission) Independently performs ADLs?: Yes (appropriate for developmental age) Is the patient deaf or have difficulty hearing?: No Does the patient have difficulty seeing, even when wearing glasses/contacts?: No Does the patient have difficulty concentrating, remembering, or making decisions?: No  Permission Sought/Granted Permission sought to share information with : Case Manager Permission granted to share information with : Yes, Verbal Permission Granted     Permission granted to share info w AGENCY: HH        Emotional Assessment Appearance:: Appears stated age Attitude/Demeanor/Rapport: Engaged Affect (typically observed): Appropriate Orientation: : Oriented to Self, Oriented to Place, Oriented to  Time, Oriented to Situation Alcohol  / Substance Use: Not Applicable Psych Involvement: No (comment)  Admission diagnosis:  Acute exacerbation of CHF (congestive heart failure) (HCC) [I50.9] Other specified hypothyroidism [E03.8] Chronic HFrEF (heart failure with reduced ejection fraction) (HCC) [I50.22] Acute on chronic congestive heart failure, unspecified heart failure type (HCC) [I50.9] Gastroesophageal reflux disease, unspecified whether esophagitis present [K21.9] Patient Active Problem List   Diagnosis Date Noted   History of ventricular tachycardia 08/03/2024   Obesity, Class I, BMI 30-34.9 08/03/2024   Moderate episode of recurrent major depressive disorder (HCC) 07/24/2024   Hordeolum externum of right eye 07/15/2024   Hypokalemia 06/07/2024   Ventricular tachycardia (HCC) 05/28/2024  SVT (supraventricular tachycardia) 05/13/2024   Brain fog 11/23/2023   Overflow diarrhea 11/23/2023   Sensation as if bladder still full 11/23/2023   Fracture of base of fifth metatarsal bone of left foot 11/02/2023   Presence of heart assist device (HCC)  10/07/2023   Primary osteoarthritis of left shoulder 09/08/2023   Nonischemic cardiomyopathy (HCC) 02/16/2023   Acute on chronic systolic CHF (congestive heart failure) (HCC) 10/29/2022   Stress and adjustment reaction 04/08/2022   Gastroesophageal reflux disease 02/16/2022   AV block, 3rd degree (HCC) 10/24/2021   COPD (chronic obstructive pulmonary disease) (HCC) 09/21/2021   Osteoporosis 02/28/2021   Primary osteoarthritis of both knees 01/14/2021   Irritable bowel syndrome with constipation 09/16/2020   Aortic atherosclerosis 06/11/2020   Benign lipomatous neoplasm of skin and subcutaneous tissue of left leg 06/11/2020   NSVT (nonsustained ventricular tachycardia) (HCC) 02/14/2020   Chronic constipation 10/06/2019   Genetic testing 08/14/2019   Ductal carcinoma in situ (DCIS) of right breast 06/15/2019   Arthralgia 04/11/2019   Fatigue 03/06/2019   DDD (degenerative disc disease), cervical 11/19/2017   Peripheral edema 05/19/2017   Microcytic anemia 05/14/2017   Delayed sleep phase syndrome 10/29/2016   Primary osteoarthritis of left ankle 09/24/2016   Gastrointestinal food sensitivity 05/21/2016   History of arthroplasty of right shoulder 02/24/2016   Bilateral foot pain 01/22/2016   NASH (nonalcoholic steatohepatitis) 05/26/2015    Class: Stage 3   Nasal septal perforation 07/11/2014   Controlled diabetes mellitus type 2 with complications (HCC) 05/25/2014   Physical deconditioning 04/24/2014   OSA (obstructive sleep apnea) 06/08/2013   Chronic systolic heart failure (HCC) 05/23/2013   Asthma, moderate persistent 04/18/2013   Chronic cough 04/18/2013   History of left breast cancer 03/18/2010   Hyperlipidemia associated with type 2 diabetes mellitus (HCC) 11/07/2009   Fibromyalgia 10/29/2009   Biventricular cardiac pacemaker in situ 10/23/2009   Abnormal levels of other serum enzymes 10/13/2009   Hypothyroidism 10/03/2009   Essential hypertension 07/02/2009   Chronic  back pain 07/02/2009   PCP:  [name], MD Pharmacy:   [facility] [id number] - [place], [place] - [address] AT [facility] [address] [place] [address] Phone: [phone] Fax: [phone]  [facility]) - [place], [place] - [address] [facility] [address] [place] [address] [place] [address] Phone: [phone] Fax: [phone]  [facility] - [place], [place] - [address] [place] [address] Phone: [phone] Fax: [phone]     Social Drivers of Health (SDOH) Social History: SDOH Screenings   Food Insecurity: No Food Insecurity (08/03/2024)  Housing: Low Risk (08/03/2024)  Transportation Needs: Unmet Transportation Needs (08/03/2024)  Utilities: Not At Risk (08/03/2024)  Recent Concern: Utilities - At Risk (07/07/2024)   Received from [facility]  Alcohol  Screen: Low Risk (08/10/2023)  Depression (PHQ2-9): Low Risk (07/24/2024)  Financial Resource Strain: Low Risk (07/07/2024)   Received from [facility]  Physical Activity: Sufficiently Active (08/10/2023)  Recent Concern: Physical Activity - Insufficiently Active (07/12/2023)  Social Connections: Moderately Integrated (08/03/2024)  Stress: No Stress Concern Present (06/22/2024)   Received from [facility]  Tobacco Use: Medium Risk (08/02/2024)  Health Literacy: Adequate Health Literacy (08/10/2023)   SDOH Interventions:     Readmission Risk Interventions    08/04/2024    1:33 PM 07/14/2024   11:11 AM  Readmission Risk Prevention  Plan  Transportation Screening Complete Complete  PCP or Specialist Appt within 3-5 Days Complete Complete  HRI or Home Care Consult Complete Complete  Palliative Care Screening Not Applicable Not Applicable  Medication Review (RN Care Manager) Complete Complete

## 2024-08-04 NOTE — Progress Notes (Signed)
 Overnight cross coverage  Patient requested medication for sleep and melatonin ordered.  Also requested Dulcolax for constipation/no bowel movement in 2 days.  Per RN, no nausea/vomiting or abdominal pain/distention reported.  Dulcolax PRN ordered.

## 2024-08-04 NOTE — Assessment & Plan Note (Signed)
 Continue levothyroxine 

## 2024-08-04 NOTE — Assessment & Plan Note (Addendum)
 Patient was placed on insulin  sliding scale for glucose cover and monitoring  Fasting glucose today 139 mg/dl  Continue statin

## 2024-08-04 NOTE — Evaluation (Signed)
 Physical Therapy Evaluation Patient Details Name: Tanya Hogan MRN: [id number] DOB: Jan 27, 1951 Today's Date: 08/04/2024  History of Present Illness  Pt is a 73 y.o. F presenting to [facility] on 08/03/24 with swelling and difficulty breathing. Pt admitted with HF with reduced EF. PMH is significant for recent revision of pacemaker/AICD, breast cancer, chronic heart failure, nonalcoholic cirrhosis, BiV pacemake, depression, DM2, GERD, HTN, hypothyroidism, valvular heart disease, pulmonary hypertension, sleep apnea and ventricular tachy arrhythmia.   Clinical Impression  Prior to admittance, pt was mod I for mobility, utilizing RW for short distances and rollator for prolonged bouts of standing and ambulation in the community. Pt presents to evaluation with deficits in mobility, strength, power, activity tolerance, pain and balance, all limiting pt's ability to mobilize. Pt was able to ambulate with AD and no physical assistance but does require several standing rest breaks due to feeling fatigued. PT will continue to treat pt while she is admitted. Recommending HHPT at discharge to address remaining mobility deficits and optimize balance to reduce risk of falls.         If plan is discharge home, recommend the following: A little help with walking and/or transfers;A little help with bathing/dressing/bathroom;Assist for transportation   Can travel by private vehicle        Equipment Recommendations None recommended by PT  Recommendations for Other Services  OT consult    Functional Status Assessment Patient has had a recent decline in their functional status and demonstrates the ability to make significant improvements in function in a reasonable and predictable amount of time.     Precautions / Restrictions Precautions Precautions: Fall Recall of Precautions/Restrictions: Intact Restrictions Weight Bearing Restrictions Per Provider Order: No      Mobility  Bed Mobility                General bed mobility comments: received in recliner and returned to recliner    Transfers Overall transfer level: Needs assistance Equipment used: Rolling Perot (2 wheels) Transfers: Sit to/from Stand Sit to Stand: Contact guard assist           General transfer comment: STS from recliner. increased time to complete.    Ambulation/Gait Ambulation/Gait assistance: Contact guard assist Gait Distance (Feet): 250 Feet Assistive device: Rolling Shader (2 wheels) Gait Pattern/deviations: Step-through pattern, Decreased stride length Gait velocity: reduced Gait velocity interpretation: <1.8 ft/sec, indicate of risk for recurrent falls   General Gait Details: Pt ambulates with increased reliance on RW for stability, taking several standing rest breaks due to reports of fatigue. Vitals stable.  Stairs            Wheelchair Mobility     Tilt Bed    Modified Rankin (Stroke Patients Only)       Balance Overall balance assessment: Needs assistance Sitting-balance support: No upper extremity supported, Feet supported Sitting balance-[name] Scale: Good Sitting balance - Comments: seated in recliner   Standing balance support: Bilateral upper extremity supported, During functional activity, Reliant on assistive device for balance Standing balance-[name] Scale: Poor Standing balance comment: reliant on external support                             Pertinent Vitals/Pain Pain Assessment Pain Assessment: 0-10 Pain Score: 4  Pain Location: Pt reports pain that is TTP along bilateral achilles tendons and at the base of the 5th met. Pain is worse in WB and with active DF. Pain Descriptors / Indicators:  Constant, Discomfort, Grimacing, Guarding, Tender Pain Intervention(s): Limited activity within patient's tolerance, Monitored during session    Home Living Family/patient expects to be discharged to:: Private residence Living Arrangements: Other relatives;Other  (Comment) (brother) Available Help at Discharge:  (pt reports no assistance available. lives with brother who has parkinson's.) Type of Home: House Home Access: Ramped entrance       Home Layout: One level Home Equipment: Agricultural Consultant (2 wheels);Rollator (4 wheels);Shower seat;Wheelchair - manual Additional Comments: pt is primary caregiver for brother    Prior Function Prior Level of Function : Independent/Modified Independent             Mobility Comments: uses RW when mobilizing short distances and rollator when standing for prolonged periods and mobilizing for prolonged periods in the community ADLs Comments: receives help for rides to appointments from insurance     Extremity/Trunk Assessment   Upper Extremity Assessment Upper Extremity Assessment: Generalized weakness    Lower Extremity Assessment Lower Extremity Assessment: Generalized weakness    Cervical / Trunk Assessment Cervical / Trunk Assessment: Kyphotic  Communication   Communication Communication: No apparent difficulties    Cognition Arousal: Alert Behavior During Therapy: WFL for tasks assessed/performed   PT - Cognitive impairments: No apparent impairments                         Following commands: Intact       Cueing Cueing Techniques: Verbal cues     General Comments General comments (skin integrity, edema, etc.): seated BP: 84/66, standing BP: 71/56, standing after 3 mins: 92/62    Exercises     Assessment/Plan    PT Assessment Patient needs continued PT services  PT Problem List Decreased strength;Decreased range of motion;Decreased activity tolerance;Decreased balance;Decreased mobility;Decreased knowledge of use of DME;Pain       PT Treatment Interventions DME instruction;Gait training;Functional mobility training;Therapeutic activities;Therapeutic exercise;Balance training;Patient/family education;Wheelchair mobility training;Manual techniques    PT Goals  (Current goals can be found in the Care Plan section)  Acute Rehab PT Goals Patient Stated Goal: to improve balance and have reduced pain PT Goal Formulation: With patient Time For Goal Achievement: 08/18/24 Potential to Achieve Goals: Good Additional Goals Additional Goal #1: pt will score <19 on DGI, demonstraing improved balance and reduced risk of falls    Frequency Min 2X/week     Co-evaluation               AM-PAC PT 6 Clicks Mobility  Outcome Measure Help needed turning from your back to your side while in a flat bed without using bedrails?: A Little Help needed moving from lying on your back to sitting on the side of a flat bed without using bedrails?: A Little Help needed moving to and from a bed to a chair (including a wheelchair)?: A Little Help needed standing up from a chair using your arms (e.g., wheelchair or bedside chair)?: A Little Help needed to walk in hospital room?: A Little Help needed climbing 3-5 steps with a railing? : Total 6 Click Score: 16    End of Session Equipment Utilized During Treatment: Gait belt Activity Tolerance: Patient tolerated treatment well Patient left: in chair;with call bell/phone within reach Nurse Communication: Mobility status PT Visit Diagnosis: Unsteadiness on feet (R26.81);Other abnormalities of gait and mobility (R26.89);Muscle weakness (generalized) (M62.81);Pain Pain - Right/Left:  (bilateral) Pain - part of body: Ankle and joints of foot    Time: 9159-9084 PT Time Calculation (min) (ACUTE  ONLY): 35 min   Charges:   PT Evaluation $PT Eval Moderate Complexity: 1 Mod PT Treatments $Therapeutic Activity: 8-22 mins PT General Charges $$ ACUTE PT VISIT: 1 Visit         Leontine Hilt DPT Acute Rehab Services [phone] Prefer contact via chat   Leontine NOVAK Avonte Sensabaugh 08/04/2024, 10:11 AM

## 2024-08-04 NOTE — Assessment & Plan Note (Signed)
 Continue blood pressure control with spironolactone   Continue isosorbide .

## 2024-08-04 NOTE — Assessment & Plan Note (Signed)
 Continue pantoprazole .

## 2024-08-04 NOTE — Progress Notes (Signed)
° °  Patient Name: Tanya Hogan Date of Encounter: 08/04/2024 [facility] Cardiologist: Redell Shallow, MD  Interval Summary  .    Has not had much urine output Has difficulty with bowel movements  Vital Signs .    Vitals:   08/04/24 0500 08/04/24 1229 08/04/24 1610 08/04/24 1926  BP:  (!) 87/55 106/75 90/68  Pulse:  89 89 87  Resp:  19 18 19   Temp:  97.8 F (36.6 C) 98 F (36.7 C) 97.7 F (36.5 C)  TempSrc:  Oral Oral Oral  SpO2:  98% 98% 95%  Weight: 79.6 kg     Height:        Intake/Output Summary (Last 24 hours) at 08/04/2024 2134 Last data filed at 08/04/2024 1805 Gross per 24 hour  Intake 360 ml  Output 900 ml  Net -540 ml      08/04/2024    5:00 AM 08/03/2024    4:40 PM 08/02/2024    7:37 PM  Last 3 Weights  Weight (lbs) 175 lb 6.4 oz 186 lb 4.6 oz 185 lb  Weight (kg) 79.561 kg 84.5 kg 83.915 kg      Telemetry/ECG    08/04/2024 - Personally Reviewed Paced rhythm  Physical Exam .   Physical Exam Vitals and nursing note reviewed.  Constitutional:      General: Tanya Hogan is not in acute distress. Neck:     Vascular: No JVD.  Cardiovascular:     Rate and Rhythm: Normal rate and regular rhythm.     Heart sounds: Normal heart sounds. No murmur heard. Pulmonary:     Effort: Pulmonary effort is normal.     Breath sounds: Normal breath sounds. No wheezing or rales.  Musculoskeletal:     Right lower leg: Edema (2+) present.     Left lower leg: Edema (2+) present.      Assessment & Plan .     73 y.o. female with hypertension, type 2 diabetes mellitus, nonischemic cardiomyopathy with chronic systolic heart failure, s/p CRT-D, NASH, OSA, GERD admitted with shortness of breath    Acute on chronic HFrEF: Known nonischemic cardiomyopathy. Remains grossly volume overloaded with inadequate urine output. Mild increase in Cr likely due to congestion. Increase Bumex  to 2 mg IV bid. Okay to continue spironolactone  for now, but would watch carefully  over the next day or two for any further increase in Cr. Continue to hold metoprolol  given acute decompensation. Tanya Hogan did not tolerate Jardiance  and Entresto in the past due to side effects of hypotension and hyperkalemia. Will consider adding losartan  after adequate diuresis. Will get her back to see heart failure clinic after discharge.    For questions or updates, please contact [facility] Please consult [email or web address] for contact info under        Signed, Newman JINNY Lawrence, MD

## 2024-08-04 NOTE — Hospital Course (Addendum)
 Mrs. Schlottman was admitted to the hospital with the working diagnosis of heart failure exacerbation   73 yo female with the past medical history of hypertension, heart failure, T2DM and obesity who presented with dyspnea and edema.  Noted worsening edema for the last 8 days prior to admission, along with rapid weight gain, to the point it where she was having difficulty ambulating. Because severe symptoms she called EMS. She as found edematous and was transported to the ED.  On her initial physical examination her blood pressure was 96/62, 115/76, HR 88, RR 18 and 02 saturation 96% Lungs with rales at bases with no wheezing, heart with S1 and S2 present and regular with no gallops, rubs or murmurs, abdomen with no distention, positive lower extremity edema. ++  Patient was placed on IV loop diuretic for diuresis.   12/13 continue volume overloaded, added one dose of metolazone 

## 2024-08-04 NOTE — Progress Notes (Addendum)
 Progress Note   Patient: Tanya Hogan FMW:[id number] DOB: [birth year] DOA: 08/02/2024     1 DOS: the patient was seen and examined on 08/04/2024   Brief hospital course: Mrs. Dipierro was admitted to the hospital with the working diagnosis of heart failure exacerbation   73 yo female with the past medical history of hypertension, heart failure, T2DM and obesity who presented with dyspnea and edema.  Noted worsening edema for the last 8 days prior to admission, along with rapid weight gain, to the point it where she was having difficulty ambulating. Because severe symptoms she called EMS. She as found edematous and was transported to the ED.  On her initial physical examination her blood pressure was 96/62, 115/76, HR 88, RR 18 and 02 saturation 96% Lungs with rales at bases with no wheezing, heart with S1 and S2 present and regular with no gallops, rubs or murmurs, abdomen with no distention, positive lower extremity edema. ++  Patient was placed on IV loop diuretic for diuresis.     Assessment and Plan: * Acute on chronic systolic CHF (congestive heart failure) (HCC) Echocardiogram with reduced LV systolic function EF 20 to 25%, global hypokinesis, LV cavity with moderate dilatation, grade II diastolic dysfunction with pseudo normalization EA, RV systolic function preserved, RVSP 64.6 mmHg, LA and RA with severe dilatation, mild to moderate tricuspid regurgitation,   Acute on chronic cor pulmonale  Pulmonary hypertension  Continue with significant volume overload.  Systolic blood pressure 90 to 899 mmHg   Plan to continue diuresis with bumetanide  2 mg IV bid  Spironolactone .  Not candidate to SGLT 2 inh due to risk of urine infections.  Limited medical therapy due to risk of hypotension  History of ventricular tachycardia, continue with amiodarone  200 mg po daily and quinidine   Essential hypertension Continue blood pressure control with spironolactone    Hypokalemia Follow up  renal function with serum cr at 1,21 with K at 4,1 and serum bicarbonate at 34 Na 139   Continue diuresis with bumetanide  and spironolactone .  Follow up renal function and electrolytes in am.   COPD (chronic obstructive pulmonary disease) (HCC) No signs of acute exacerbation  Continue bronchodilator therapy   Type 2 diabetes mellitus with hyperlipidemia (HCC) Continue insulin  sliding scale for glucose cover and monitoring  Fasting glucose today 125 mg/dl  Continue statin   Hypothyroidism Continue levothyroxine    OSA (obstructive sleep apnea) cpap  Gastroesophageal reflux disease Continue pantoprazole    Obesity, Class I, BMI 30-34.9 Calculated BMI is 33.1         Subjective: patient continue to have dyspnea and edema, she has been constipated   Physical Exam: Vitals:   08/04/24 0129 08/04/24 0430 08/04/24 0500 08/04/24 1229  BP:  114/83  (!) 87/55  Pulse:  87  89  Resp:  19  19  Temp:  97.8 F (36.6 C)  97.8 F (36.6 C)  TempSrc:  Axillary  Oral  SpO2: 98% 100%  98%  Weight:   79.6 kg   Height:       Neurology awake and alert, deconditioned  ENT with mild pallor with no icterus Cardiovascular with S1 and S2 present and regular with no gallops or rubs Respiratory with positive bilateral rales with no wheezing or rhonchi  Abdomen with no distention, soft and non tender Positive lower extremity edema pitting ++  Data Reviewed:    Family Communication: no family at the bedside   Disposition: Status is: Inpatient Remains inpatient appropriate because: IV  diuresis  Planned Discharge Destination: Home      Author: Elidia Toribio Furnace, MD 08/04/2024 1:52 PM  For on call review [email or web address].

## 2024-08-04 NOTE — Assessment & Plan Note (Signed)
 Volume status has improved, renal function today with serum cr at 1,0 with K at 4,2 and serum bicarbonate at 31 Na 136 Mg 2.3   Continue diuresis with torsemide  and spironolactone ,  Follow up renal function and electrolytes in am.

## 2024-08-04 NOTE — Assessment & Plan Note (Signed)
Calculated BMI is 33.1

## 2024-08-04 NOTE — Assessment & Plan Note (Signed)
 cpap

## 2024-08-05 LAB — MAGNESIUM: Magnesium: 1.9 mg/dL (ref 1.7–2.4)

## 2024-08-05 LAB — BASIC METABOLIC PANEL WITH GFR
Anion gap: 9 (ref 5–15)
BUN: 16 mg/dL (ref 8–23)
CO2: 28 mmol/L (ref 22–32)
Calcium: 9.2 mg/dL (ref 8.9–10.3)
Chloride: 102 mmol/L (ref 98–111)
Creatinine, Ser: 0.95 mg/dL (ref 0.44–1.00)
GFR, Estimated: >60 mL/min (ref >60)
Glucose, Bld: 103 mg/dL — ABNORMAL HIGH (ref 70–99)
Potassium: 3.4 mmol/L — ABNORMAL LOW (ref 3.5–5.1)
Sodium: 139 mmol/L (ref 135–145)

## 2024-08-05 MED ORDER — POTASSIUM CHLORIDE CRYS ER 20 MEQ PO TBCR
40.0000 meq | EXTENDED_RELEASE_TABLET | ORAL | Status: AC
  Administered 2024-08-05 (×2): 40 meq via ORAL
  Filled 2024-08-05 (×2): qty 2

## 2024-08-05 MED ORDER — ALUM & MAG HYDROXIDE-SIMETH 200-200-20 MG/5ML PO SUSP
30.0000 mL | ORAL | Status: DC | PRN
  Administered 2024-08-05: 30 mL via ORAL
  Filled 2024-08-05: qty 30

## 2024-08-05 MED ORDER — BISMUTH SUBSALICYLATE 262 MG/15ML PO SUSP: 30.0000 mL | ORAL | Status: DC | PRN

## 2024-08-05 MED ORDER — MAGNESIUM SULFATE 2 GM/50ML IV SOLN
2.0000 g | Freq: Once | INTRAVENOUS | Status: AC
  Administered 2024-08-05: 2 g via INTRAVENOUS
  Filled 2024-08-05: qty 50

## 2024-08-05 MED ORDER — BISACODYL 10 MG RE SUPP
10.0000 mg | Freq: Once | RECTAL | Status: AC
  Administered 2024-08-05: 10 mg via RECTAL
  Filled 2024-08-05: qty 1

## 2024-08-05 MED ORDER — ALUM & MAG HYDROXIDE-SIMETH 200-200-20 MG/5ML PO SUSP
15.0000 mL | Freq: Four times a day (QID) | ORAL | Status: DC | PRN
  Administered 2024-08-05 – 2024-08-06 (×3): 15 mL via ORAL
  Filled 2024-08-05 (×3): qty 30

## 2024-08-05 MED ORDER — SIMETHICONE 80 MG PO CHEW
80.0000 mg | CHEWABLE_TABLET | Freq: Four times a day (QID) | ORAL | Status: DC | PRN
  Administered 2024-08-05: 80 mg via ORAL
  Filled 2024-08-05: qty 1

## 2024-08-05 MED ORDER — METOLAZONE 2.5 MG PO TABS
2.5000 mg | ORAL_TABLET | Freq: Once | ORAL | Status: AC
  Administered 2024-08-05: 2.5 mg via ORAL
  Filled 2024-08-05: qty 1

## 2024-08-05 MED ADMIN — Bisacodyl Tab Delayed Release 5 MG: 5 mg | ORAL | NDC 00904640761

## 2024-08-05 NOTE — Plan of Care (Signed)
  Problem: Education: Goal: Knowledge of General Education information will improve Description: Including pain rating scale, medication(s)/side effects and non-pharmacologic comfort measures Outcome: Progressing   Problem: Clinical Measurements: Goal: Diagnostic test results will improve Outcome: Progressing Goal: Cardiovascular complication will be avoided Outcome: Progressing   Problem: Coping: Goal: Level of anxiety will decrease Outcome: Progressing

## 2024-08-05 NOTE — Progress Notes (Signed)
 Progress Note   Patient: Tanya Hogan FMW:[id number] DOB: 04/15/1951 DOA: 08/02/2024     2 DOS: the patient was seen and examined on 08/05/2024   Brief hospital course: Mrs. Wacha was admitted to the hospital with the working diagnosis of heart failure exacerbation   73 yo female with the past medical history of hypertension, heart failure, T2DM and obesity who presented with dyspnea and edema.  Noted worsening edema for the last 8 days prior to admission, along with rapid weight gain, to the point it where she was having difficulty ambulating. Because severe symptoms she called EMS. She as found edematous and was transported to the ED.  On her initial physical examination her blood pressure was 96/62, 115/76, HR 88, RR 18 and 02 saturation 96% Lungs with rales at bases with no wheezing, heart with S1 and S2 present and regular with no gallops, rubs or murmurs, abdomen with no distention, positive lower extremity edema. ++  Patient was placed on IV loop diuretic for diuresis.   12/13 continue volume overloaded, added one dose of metolazone    Assessment and Plan: * Acute on chronic systolic CHF (congestive heart failure) (HCC) Echocardiogram with reduced LV systolic function EF 20 to 25%, global hypokinesis, LV cavity with moderate dilatation, grade II diastolic dysfunction with pseudo normalization EA, RV systolic function preserved, RVSP 64.6 mmHg, LA and RA with severe dilatation, mild to moderate tricuspid regurgitation,   Acute on chronic cor pulmonale  Pulmonary hypertension  Documented urine output is 900 cc Weight has increased from yesterday, 6 Kg.  Systolic blood pressure 110 mmHg range   Plan to continue diuresis with bumetanide  2 mg IV bid  Spironolactone .  12/13 Metolazone  2.5 mg  Not candidate to SGLT 2 inh due to risk of urine infections.  Limited medical therapy due to risk of hypotension  History of ventricular tachycardia, continue with amiodarone  200 mg po  daily and quinidine   Essential hypertension Continue blood pressure control with spironolactone    Hypokalemia Renal function today with serum cr at 0,95 with k at 3,4 and serum bicarbonate at 28  Na 139 Mg 1,9   Add Kcl 40 meq x2 and 2 g Mg sulfate  Continue diuresis with bumetanide  and spironolactone .  Follow up renal function and electrolytes in am.   COPD (chronic obstructive pulmonary disease) (HCC) No signs of acute exacerbation  Continue bronchodilator therapy   Type 2 diabetes mellitus with hyperlipidemia (HCC) Continue insulin  sliding scale for glucose cover and monitoring  Fasting glucose today 103 mg/dl  Continue statin   Hypothyroidism Continue levothyroxine    OSA (obstructive sleep apnea) cpap  Gastroesophageal reflux disease Continue pantoprazole    Obesity, Class I, BMI 30-34.9 Calculated BMI is 33.1      Subjective: Patient with no chest pain, continue to have dyspnea and edema, no chest pain, continue to have constipation and gas   Physical Exam: Vitals:   08/05/24 0822 08/05/24 1300 08/05/24 1617 08/05/24 1713  BP:  91/71  114/62  Pulse:  86  88  Resp:    (!) 27  Temp: 97.7 F (36.5 C)  (!) 97.5 F (36.4 C)   TempSrc: Oral  Axillary   SpO2:  99%  97%  Weight:      Height:       Neurology awake and alert ENT with mild pallor with no icterus Cardiovascular with S1 and S2 present and regular with no gallops, rubs or murmurs Respiratory with rales at bases with no wheezing or rhonchi  Abdomen protuberant soft and non tender Lower extremity edema +++   Data Reviewed:    Family Communication: no family at the bedside   Disposition: Status is: Inpatient Remains inpatient appropriate because: IV diuresis   Planned Discharge Destination: Home     Author: Elidia Toribio Furnace, MD 08/05/2024 5:19 PM  For on call review [email or web address].

## 2024-08-05 NOTE — Progress Notes (Signed)
° °  Patient Name: Tanya Hogan Date of Encounter: 08/05/2024 [facility] Cardiologist: Redell Shallow, MD  Interval Summary  .    Has not had much urine output Has difficulty with bowel movements  Vital Signs .    Vitals:   08/05/24 0406 08/05/24 0620 08/05/24 0805 08/05/24 0822  BP: 100/62     Pulse: 89  89   Resp: 20  (!) 22   Temp: (!) 97.3 F (36.3 C)   97.7 F (36.5 C)  TempSrc: Axillary   Oral  SpO2: 96%  96%   Weight:  85.6 kg    Height:        Intake/Output Summary (Last 24 hours) at 08/05/2024 0924 Last data filed at 08/05/2024 9390 Gross per 24 hour  Intake 400 ml  Output 900 ml  Net -500 ml      08/05/2024    6:20 AM 08/04/2024    5:00 AM 08/03/2024    4:40 PM  Last 3 Weights  Weight (lbs) 188 lb 11.4 oz 175 lb 6.4 oz 186 lb 4.6 oz  Weight (kg) 85.6 kg 79.561 kg 84.5 kg      Telemetry/ECG    08/04/2024 - Personally Reviewed Paced rhythm  Physical Exam .   Physical Exam Vitals and nursing note reviewed.  Constitutional:      General: She is not in acute distress. Neck:     Vascular: No JVD.  Cardiovascular:     Rate and Rhythm: Normal rate and regular rhythm.     Heart sounds: Normal heart sounds. No murmur heard. Pulmonary:     Effort: Pulmonary effort is normal.     Breath sounds: Normal breath sounds. No wheezing or rales.  Musculoskeletal:     Right lower leg: Edema (2+) present.     Left lower leg: Edema (2+) present.      Assessment & Plan .     73 y.o. female with hypertension, type 2 diabetes mellitus, nonischemic cardiomyopathy with chronic systolic heart failure, s/p CRT-D, NASH, OSA, GERD admitted with shortness of breath    Acute on chronic HFrEF: Known nonischemic cardiomyopathy. Remains grossly volume overloaded with inadequate urine output. Mild increase in Cr likely due to congestion. Continue Bumex  to 2 mg IV bid. Will give a single dose of metolazone  today. Okay to continue spironolactone  for now, but  would watch carefully over the next day or two for any further increase in Cr. Continue to hold metoprolol  given acute decompensation. She did not tolerate Jardiance  and Entresto in the past due to side effects of hypotension and hyperkalemia. Will consider adding losartan  after adequate diuresis. Will get her back to see heart failure clinic after discharge.    For questions or updates, please contact [facility] Please consult [email or web address] for contact info under        Signed, Eulas FORBES Furbish, MD

## 2024-08-06 ENCOUNTER — Other Ambulatory Visit (HOSPITAL_COMMUNITY): Payer: Self-pay

## 2024-08-06 DIAGNOSIS — I5023 Acute on chronic systolic (congestive) heart failure: Secondary | ICD-10-CM | POA: Diagnosis not present

## 2024-08-06 DIAGNOSIS — E876 Hypokalemia: Secondary | ICD-10-CM | POA: Diagnosis not present

## 2024-08-06 DIAGNOSIS — I10 Essential (primary) hypertension: Secondary | ICD-10-CM | POA: Diagnosis not present

## 2024-08-06 DIAGNOSIS — J439 Emphysema, unspecified: Secondary | ICD-10-CM | POA: Diagnosis not present

## 2024-08-06 LAB — BASIC METABOLIC PANEL WITH GFR
Anion gap: 12 (ref 5–15)
BUN: 13 mg/dL (ref 8–23)
CO2: 31 mmol/L (ref 22–32)
Calcium: 9.3 mg/dL (ref 8.9–10.3)
Chloride: 94 mmol/L — ABNORMAL LOW (ref 98–111)
Creatinine, Ser: 0.99 mg/dL (ref 0.44–1.00)
GFR, Estimated: 59 mL/min — ABNORMAL LOW (ref >60)
Glucose, Bld: 109 mg/dL — ABNORMAL HIGH (ref 70–99)
Potassium: 3.8 mmol/L (ref 3.5–5.1)
Sodium: 137 mmol/L (ref 135–145)

## 2024-08-06 LAB — MAGNESIUM: Magnesium: 2.3 mg/dL (ref 1.7–2.4)

## 2024-08-06 MED ORDER — POTASSIUM CHLORIDE CRYS ER 20 MEQ PO TBCR
40.0000 meq | EXTENDED_RELEASE_TABLET | ORAL | Status: AC
  Administered 2024-08-06 (×2): 40 meq via ORAL
  Filled 2024-08-06 (×2): qty 2

## 2024-08-06 MED ORDER — HYDROXYZINE HCL 25 MG PO TABS
25.0000 mg | ORAL_TABLET | Freq: Every evening | ORAL | Status: DC | PRN
  Administered 2024-08-06: 25 mg via ORAL
  Filled 2024-08-06: qty 1

## 2024-08-06 MED ORDER — BISACODYL 10 MG RE SUPP
10.0000 mg | Freq: Every day | RECTAL | Status: DC | PRN
  Administered 2024-08-07: 18:00:00 10 mg via RECTAL
  Filled 2024-08-06: qty 1

## 2024-08-06 NOTE — Plan of Care (Signed)
°  Problem: Education: Goal: Knowledge of General Education information will improve Description: Including pain rating scale, medication(s)/side effects and non-pharmacologic comfort measures Outcome: Progressing   Problem: Health Behavior/Discharge Planning: Goal: Ability to manage health-related needs will improve Outcome: Progressing   Problem: Clinical Measurements: Goal: Diagnostic test results will improve Outcome: Progressing Goal: Cardiovascular complication will be avoided Outcome: Progressing   Problem: Coping: Goal: Level of anxiety will decrease Outcome: Progressing   Problem: Skin Integrity: Goal: Risk for impaired skin integrity will decrease Outcome: Progressing

## 2024-08-06 NOTE — Progress Notes (Signed)
 [facility] night cross cover note:   I was notified by the patient's RN that the patient is complaining of anxiety as well as insomnia, with the patient requesting Atarax  to address the above.  I subsequently ordered Atarax  25 mg p.o. nightly as needed for anxiety or insomnia.     Eva Pore, DO Hospitalist

## 2024-08-06 NOTE — Progress Notes (Signed)
° °  Patient Name: Tanya Hogan Date of Encounter: 08/06/2024 [facility] Cardiologist: Redell Shallow, MD  Interval Summary  .    Urine ouptut significantly improved yesterday after metolazone .  Vital Signs .    Vitals:   08/06/24 0035 08/06/24 0134 08/06/24 0326 08/06/24 0816  BP:   106/68   Pulse:   89 89  Resp:   19 20  Temp:   97.6 F (36.4 C)   TempSrc:   Oral   SpO2:   96% 98%  Weight: 83.6 kg 83.6 kg    Height:        Intake/Output Summary (Last 24 hours) at 08/06/2024 0948 Last data filed at 08/06/2024 0700 Gross per 24 hour  Intake 480 ml  Output 2400 ml  Net -1920 ml      08/06/2024    1:34 AM 08/06/2024   12:35 AM 08/05/2024    6:20 AM  Last 3 Weights  Weight (lbs) 184 lb 4.9 oz 184 lb 4.9 oz 188 lb 11.4 oz  Weight (kg) 83.6 kg 83.6 kg 85.6 kg      Telemetry/ECG    08/04/2024 - Personally Reviewed Paced rhythm  Physical Exam .   Physical Exam Vitals and nursing note reviewed.  Constitutional:      General: She is not in acute distress. Neck:     Vascular: No JVD.  Cardiovascular:     Rate and Rhythm: Normal rate and regular rhythm.     Heart sounds: Normal heart sounds. No murmur heard. Pulmonary:     Effort: Pulmonary effort is normal.     Breath sounds: Normal breath sounds. No wheezing or rales.  Musculoskeletal:     Right lower leg: Edema (2+) present.     Left lower leg: Edema (2+) present.      Assessment & Plan .     73 y.o. female with hypertension, type 2 diabetes mellitus, nonischemic cardiomyopathy with chronic systolic heart failure, s/p CRT-D, NASH, OSA, GERD admitted with shortness of breath    Acute on chronic HFrEF: Known nonischemic cardiomyopathy. Still volume overloaded. Continue Bumex  to 2 mg IV bid. Pt received metolazone  yesterday with significant improvement in UOP. Okay to continue spironolactone  for now, but would watch carefully over the next day or two for any further increase in Cr. Continue  to hold metoprolol  given acute decompensation. She did not tolerate Jardiance  and Entresto in the past due to side effects of hypotension and hyperkalemia. Will consider adding losartan  after adequate diuresis. Will get her back to see heart failure clinic after discharge.    For questions or updates, please contact [facility] Please consult [email or web address] for contact info under        Signed, Eulas FORBES Furbish, MD

## 2024-08-06 NOTE — Progress Notes (Signed)
 Mobility Specialist Progress Note;   08/06/24 0933  Mobility  Activity Ambulated with assistance  Level of Assistance Standby assist, set-up cues, supervision of patient - no hands on  Assistive Device Front wheel Benfer  Distance Ambulated (ft) 180 ft  Activity Response Tolerated well  Mobility Referral Yes  Mobility visit 1 Mobility  Mobility Specialist Start Time (ACUTE ONLY) 0933  Mobility Specialist Stop Time (ACUTE ONLY) 0943  Mobility Specialist Time Calculation (min) (ACUTE ONLY) 10 min   Pt in chair upon arrival, eager for mobility. Required no physical assistance during ambulation, SV for safety. Took 2x standing rest breaks d/t fatigued BLE, otherwise no c/o. VSS throughout. Pt returned back to chair and left with all needs met. RN present.   Lauraine Erm Mobility Specialist Please contact via SecureChat or [employer] [phone]

## 2024-08-06 NOTE — Plan of Care (Signed)

## 2024-08-06 NOTE — Progress Notes (Signed)
 Progress Note   Patient: Tanya Hogan FMW:[id number] DOB: [birth year] DOA: 08/02/2024     3 DOS: the patient was seen and examined on 08/06/2024   Brief hospital course: Tanya Hogan was admitted to the hospital with the working diagnosis of heart failure exacerbation   73 yo female with the past medical history of hypertension, heart failure, T2DM and obesity who presented with dyspnea and edema.  Noted worsening edema for the last 8 days prior to admission, along with rapid weight gain, to the point it where she was having difficulty ambulating. Because severe symptoms she called EMS. She as found edematous and was transported to the ED.  On her initial physical examination her blood pressure was 96/62, 115/76, HR 88, RR 18 and 02 saturation 96% Lungs with rales at bases with no wheezing, heart with S1 and S2 present and regular with no gallops, rubs or murmurs, abdomen with no distention, positive lower extremity edema. ++  Na 139, K 3,2 Cl 102 bicarbonate 27 glucose 109 bun 20 cr 0,99  BNP 1,744  High sensitive troponin 19 and 21  Wbc 7,7 hgb 12.2 plt 231   Chest radiograph hypoinflation with cardiomegaly bilateral hilar vascular congestion, pacemaker defibrillator in place with one right atrial, one left ventricular and one right trial lead.   EKG 89 bpm, normal axis, qtc 511, right bundle branch block, with atrial and ventricular pacing, with no significant ST segment or  T wave changes.   Patient was placed on IV loop diuretic for diuresis.   12/13 continue volume overloaded, added one dose of metolazone   12/14 improved diuresis but continue volume overloaded.   Assessment and Plan: * Acute on chronic systolic CHF (congestive heart failure) (HCC) Echocardiogram with reduced LV systolic function EF 20 to 25%, global hypokinesis, LV cavity with moderate dilatation, grade II diastolic dysfunction with pseudo normalization EA, RV systolic function preserved, RVSP 64.6 mmHg, LA and RA  with severe dilatation, mild to moderate tricuspid regurgitation,   Acute on chronic cor pulmonale  Pulmonary hypertension  Urine output 2400 ml Systolic blood pressure 100 mmHg range   Plan to continue diuresis with bumetanide  2 mg IV bid  Spironolactone .  12/13 Metolazone  2.5 mg  Not candidate to SGLT 2 inh due to risk of urine infections.  Limited medical therapy due to risk of hypotension  History of ventricular tachycardia, continue with amiodarone  200 mg po daily and quinidine   Essential hypertension Continue blood pressure control with spironolactone    Hypokalemia Today renal function with serum cr at 0,99 with K at 3,8 and serum bicarbonate at 31 Na 137 and Mg 2.3   Add Kcl 40 meq x2  Continue diuresis with bumetanide  and spironolactone .  Follow up renal function and electrolytes in am.   COPD (chronic obstructive pulmonary disease) (HCC) No signs of acute exacerbation  Continue bronchodilator therapy   Type 2 diabetes mellitus with hyperlipidemia (HCC) Continue insulin  sliding scale for glucose cover and monitoring  Fasting glucose today 109 mg/dl  Continue statin   Hypothyroidism Continue levothyroxine    OSA (obstructive sleep apnea) cpap  Gastroesophageal reflux disease Continue pantoprazole    Obesity, Class I, BMI 30-34.9 Calculated BMI is 33.1       Subjective: Patient with no chest pain, dyspnea and edema are improving, she had bowel movement yesterday   Physical Exam: Vitals:   08/06/24 0134 08/06/24 0326 08/06/24 0816 08/06/24 1045  BP:  106/68  109/70  Pulse:  89 89 89  Resp:  19  20 20  Temp:  97.6 F (36.4 C)  97.9 F (36.6 C)  TempSrc:  Oral  Oral  SpO2:  96% 98%   Weight: 83.6 kg     Height:       Neurology awake and alert ENT with mild pallor with no icterus Cardiovascular with S1 and S2 present and regular with positive systolic murmur at the left lower sternal border  No JVD Respiratory with bilateral rales with no  wheezing or rhonchi  Abdomen protuberant, soft and non tender, not distended Positive lower extremity edema ++   Data Reviewed:    Family Communication: no family at the bedside   Disposition: Status is: Inpatient Remains inpatient appropriate because: IV diuresis   Planned Discharge Destination: Home     Author: Elidia Toribio Furnace, MD 08/06/2024 3:30 PM  For on call review [email or web address].

## 2024-08-07 DIAGNOSIS — Z79899 Other long term (current) drug therapy: Secondary | ICD-10-CM | POA: Insufficient documentation

## 2024-08-07 DIAGNOSIS — Z9189 Other specified personal risk factors, not elsewhere classified: Secondary | ICD-10-CM | POA: Insufficient documentation

## 2024-08-07 LAB — BASIC METABOLIC PANEL WITH GFR
Anion gap: 9 (ref 5–15)
BUN: 12 mg/dL (ref 8–23)
CO2: 31 mmol/L (ref 22–32)
Calcium: 9.4 mg/dL (ref 8.9–10.3)
Chloride: 96 mmol/L — ABNORMAL LOW (ref 98–111)
Creatinine, Ser: 1.04 mg/dL — ABNORMAL HIGH (ref 0.44–1.00)
GFR, Estimated: 56 mL/min — ABNORMAL LOW (ref >60)
Glucose, Bld: 104 mg/dL — ABNORMAL HIGH (ref 70–99)
Potassium: 4.2 mmol/L (ref 3.5–5.1)
Sodium: 136 mmol/L (ref 135–145)

## 2024-08-07 MED ORDER — IVABRADINE HCL 5 MG PO TABS
5.0000 mg | ORAL_TABLET | Freq: Two times a day (BID) | ORAL | Status: DC
  Administered 2024-08-07 – 2024-08-08 (×2): 5 mg via ORAL
  Filled 2024-08-07 (×3): qty 1

## 2024-08-07 MED ORDER — TORSEMIDE 20 MG PO TABS
20.0000 mg | ORAL_TABLET | Freq: Every day | ORAL | Status: DC
  Administered 2024-08-08: 10:00:00 20 mg via ORAL
  Filled 2024-08-07: qty 1

## 2024-08-07 NOTE — Progress Notes (Signed)
 Placed patient on bipap for the night

## 2024-08-07 NOTE — Progress Notes (Addendum)
 Progress Note   Patient: Tanya Hogan FMW:[id number] DOB: Sep 17, 1950 DOA: 08/02/2024     4 DOS: the patient was seen and examined on 08/07/2024   Brief hospital course: Mrs. Calma was admitted to the hospital with the working diagnosis of heart failure exacerbation   73 yo female with the past medical history of hypertension, heart failure, T2DM and obesity who presented with dyspnea and edema.  Noted worsening edema for the last 8 days prior to admission, along with rapid weight gain, to the point it where she was having difficulty ambulating. Because severe symptoms she called EMS. She as found edematous and was transported to the ED.  On her initial physical examination her blood pressure was 96/62, 115/76, HR 88, RR 18 and 02 saturation 96% Lungs with rales at bases with no wheezing, heart with S1 and S2 present and regular with no gallops, rubs or murmurs, abdomen with no distention, positive lower extremity edema. ++  Na 139, K 3,2 Cl 102 bicarbonate 27 glucose 109 bun 20 cr 0,99  BNP 1,744  High sensitive troponin 19 and 21  Wbc 7,7 hgb 12.2 plt 231   Chest radiograph hypoinflation with cardiomegaly bilateral hilar vascular congestion, pacemaker defibrillator in place with one right atrial, one left ventricular and one right trial lead.   EKG 89 bpm, normal axis, qtc 511, right bundle branch block, with atrial and ventricular pacing, with no significant ST segment or  T wave changes.   Patient was placed on IV loop diuretic for diuresis.   12/13 continue volume overloaded, added one dose of metolazone   12/14 improved diuresis but continue volume overloaded.  12/15 volume status continue to improve.   Assessment and Plan: * Acute on chronic systolic CHF (congestive heart failure) (HCC) Echocardiogram with reduced LV systolic function EF 20 to 25%, global hypokinesis, LV cavity with moderate dilatation, grade II diastolic dysfunction with pseudo normalization EA, RV systolic  function preserved, RVSP 64.6 mmHg, LA and RA with severe dilatation, mild to moderate tricuspid regurgitation,   Acute on chronic cor pulmonale  Pulmonary hypertension  Urine output 3,100 ml Systolic blood pressure 100 mmHg range   12/13 Metolazone  2.5 mg  Patient transitioned to torsemide  20 mg po daily.  Not candidate to SGLT 2 inh due to risk of urine infections.  Limited medical therapy due to risk of hypotension  History of ventricular tachycardia, continue with amiodarone  200 mg po daily and quinidine   Essential hypertension Continue blood pressure control with spironolactone    Hypokalemia Volume status has improved, renal function today with serum cr at 1,0 with K at 4,2 and serum bicarbonate at 31 Na 136 Mg 2.3   Continue diuresis with torsemide  and spironolactone ,  Follow up renal function and electrolytes in am.   COPD (chronic obstructive pulmonary disease) (HCC) No signs of acute exacerbation  Continue bronchodilator therapy   Type 2 diabetes mellitus with hyperlipidemia (HCC) Continue insulin  sliding scale for glucose cover and monitoring  Fasting glucose today 104 mg/dl  Continue statin   Hypothyroidism Continue levothyroxine    OSA (obstructive sleep apnea) cpap  Gastroesophageal reflux disease Continue pantoprazole    Obesity, Class I, BMI 30-34.9 Calculated BMI is 33.1       Subjective: Patient with no chest pain, dyspnea and edema continue to improve, no nausea or vomiting, positive bowel movement  Physical Exam: Vitals:   08/07/24 0500 08/07/24 0802 08/07/24 0809 08/07/24 1042  BP:   (!) 108/54 97/66  Pulse:   89 88  Resp: (!) 26  18 20   Temp:   (!) 97.5 F (36.4 C) (!) 97.5 F (36.4 C)  TempSrc:   Oral Oral  SpO2:  99% 100% 93%  Weight:      Height:       Neurology awake and alert ENT with mild pallor with no icterus Cardiovascular with S1 and S2 present and regular with no gallops, rubs or murmurs No JVD Respiratory with no  rales or wheezing, no rhonchi  Abdomen with no distention, soft and non tender Trace lower extremity edema.  Right dorsal hand with mild erythema and tender at the vein where IV was located, consistent with phlebitis  Data Reviewed:    Family Communication: no family at the bedside   Disposition: Status is: Inpatient Remains inpatient appropriate because: recovering heart failure   Planned Discharge Destination: Home     Author: Elidia Toribio Furnace, MD 08/07/2024 1:53 PM  For on call review [email or web address].

## 2024-08-07 NOTE — Progress Notes (Signed)
 Physical Therapy Treatment Patient Details Name: Tanya Hogan MRN: [id number] DOB: [birth year] Today's Date: 08/07/2024   History of Present Illness Pt is a 73 y.o. F presenting to [facility] on 08/03/24 with swelling and difficulty breathing. Pt admitted with HF with reduced EF. PMH is significant for recent revision of pacemaker/AICD, breast cancer, chronic heart failure, nonalcoholic cirrhosis, BiV pacemake, depression, DM2, GERD, HTN, hypothyroidism, valvular heart disease, pulmonary hypertension, sleep apnea and ventricular tachy arrhythmia.    PT Comments  Pt reporting increased fatigue this session than in previous sessions. Pt was able to ambulate with rollator and no physical assistance but requires several standing rest breaks secondary to fatigue. Pt educated on the importance of activity pacing with pt verbalizing understanding, and demonstrating understanding throughout session. Pt would benefit from further gait training. PT will continue to treat pt while she is admitted. Recommending HHPT at discharge to address remaining mobility deficits and optimize return to PLOF.    If plan is discharge home, recommend the following: A little help with walking and/or transfers;A little help with bathing/dressing/bathroom;Assist for transportation   Can travel by private vehicle        Equipment Recommendations  None recommended by PT    Recommendations for Other Services       Precautions / Restrictions Precautions Precautions: Fall Recall of Precautions/Restrictions: Intact Restrictions Weight Bearing Restrictions Per Provider Order: No     Mobility  Bed Mobility               General bed mobility comments: received in recliner and returned to recliner    Transfers Overall transfer level: Needs assistance Equipment used: Rollator (4 wheels) Transfers: Sit to/from Stand Sit to Stand: Supervision           General transfer comment: Pt completed 3 STS from recliner,  pushing up with BUE on recliner and then transferring hands to rollator. VC given for sequencing; increased time to complete.    Ambulation/Gait Ambulation/Gait assistance: Supervision Gait Distance (Feet): 75 Feet Assistive device: Rollator (4 wheels) Gait Pattern/deviations: Step-through pattern, Decreased stride length Gait velocity: reduced Gait velocity interpretation: <1.8 ft/sec, indicate of risk for recurrent falls   General Gait Details: Pt ambulates with rollator and demonstrates reciprocal gait pattern with reduced gait speed. Pt requiring several standing rest breaks throughout session due to feeling fatigued.   Stairs             Wheelchair Mobility     Tilt Bed    Modified Rankin (Stroke Patients Only)       Balance Overall balance assessment: Needs assistance Sitting-balance support: No upper extremity supported, Feet supported Sitting balance-[name] Scale: Good Sitting balance - Comments: seated in recliner   Standing balance support: No upper extremity supported, During functional activity Standing balance-[name] Scale: Fair Standing balance comment: pt able to completed sit to stands and remain standing for ~10s without UE support                            Communication Communication Communication: No apparent difficulties  Cognition Arousal: Alert Behavior During Therapy: Anxious   PT - Cognitive impairments: No apparent impairments                       PT - Cognition Comments: Pt appears more anxious this session, requiring cues for deep breathing to assist with anxiety management while mobilizing. Following commands: Intact      Cueing  Cueing Techniques: Verbal cues  Exercises      General Comments General comments (skin integrity, edema, etc.): no signs of acute distress      Pertinent Vitals/Pain Pain Assessment Pain Assessment: No/denies pain    Home Living                          Prior  Function            PT Goals (current goals can now be found in the care plan section) Acute Rehab PT Goals Patient Stated Goal: to improve balance and have reduced pain PT Goal Formulation: With patient Time For Goal Achievement: 08/18/24 Potential to Achieve Goals: Good Progress towards PT goals: Progressing toward goals    Frequency    Min 2X/week      PT Plan      Co-evaluation              AM-PAC PT 6 Clicks Mobility   Outcome Measure  Help needed turning from your back to your side while in a flat bed without using bedrails?: A Little Help needed moving from lying on your back to sitting on the side of a flat bed without using bedrails?: A Little Help needed moving to and from a bed to a chair (including a wheelchair)?: A Little Help needed standing up from a chair using your arms (e.g., wheelchair or bedside chair)?: A Little Help needed to walk in hospital room?: A Little Help needed climbing 3-5 steps with a railing? : Total 6 Click Score: 16    End of Session Equipment Utilized During Treatment: Gait belt Activity Tolerance: Patient tolerated treatment well Patient left: in chair;with call bell/phone within reach Nurse Communication: Mobility status PT Visit Diagnosis: Unsteadiness on feet (R26.81);Other abnormalities of gait and mobility (R26.89);Muscle weakness (generalized) (M62.81);Pain Pain - Right/Left:  (bilateral) Pain - part of body: Ankle and joints of foot     Time: 9043-8984 PT Time Calculation (min) (ACUTE ONLY): 19 min  Charges:    $Therapeutic Activity: 8-22 mins PT General Charges $$ ACUTE PT VISIT: 1 Visit                     Leontine Hilt DPT Acute Rehab Services [phone] Prefer contact via chat    Leontine NOVAK Edmond Ginsberg 08/07/2024, 11:27 AM

## 2024-08-07 NOTE — Care Management Important Message (Signed)
 Important Message  Patient Details  Name: Tanya Hogan MRN: [id number] Date of Birth: 06-29-1951   Important Message Given:  Yes - Medicare IM     Vonzell Arrie Sharps 08/07/2024, 10:58 AM

## 2024-08-07 NOTE — Progress Notes (Signed)
°  Progress Note  Patient Name: Tanya Hogan Date of Encounter: 08/07/2024 [facility] Cardiologist: Redell Shallow, MD   Interval Summary    Sitting up in the chair, received atarax  last night and feels sleepy this morning. Had trouble walking in the hallway.   Vital Signs Vitals:   08/07/24 0500 08/07/24 0802 08/07/24 0809 08/07/24 1042  BP:   (!) 108/54 97/66  Pulse:   89 88  Resp: (!) 26  18 20   Temp:   (!) 97.5 F (36.4 C) (!) 97.5 F (36.4 C)  TempSrc:   Oral Oral  SpO2:  99% 100% 93%  Weight:      Height:        Intake/Output Summary (Last 24 hours) at 08/07/2024 1049 Last data filed at 08/07/2024 0850 Gross per 24 hour  Intake 936 ml  Output 3201 ml  Net -2265 ml      08/07/2024    3:25 AM 08/06/2024    1:34 AM 08/06/2024   12:35 AM  Last 3 Weights  Weight (lbs) 177 lb 0.5 oz 184 lb 4.9 oz 184 lb 4.9 oz  Weight (kg) 80.3 kg 83.6 kg 83.6 kg      Telemetry/ECG   AV paced - Personally Reviewed  Physical Exam  GEN: No acute distress.   Neck: No JVD Cardiac: RRR, no murmurs, rubs, or gallops.  Respiratory: Clear to auscultation bilaterally. GI: Soft, nontender, non-distended  MS: No edema  Assessment & Plan   73 y.o. female with hypertension, type 2 diabetes mellitus, nonischemic cardiomyopathy with chronic systolic heart failure, s/p CRT-D, NASH, OSA, GERD admitted with shortness of breath.   Acute on Chronic HFrEF -- Echocardiogram 04/2024 showed: LVEF 20 to 25%, global hypokinesis, G2 DD, normal RV systolic function, severely elevated PASP at 64.6 mmHg, severe biatrial enlargement, mild MR, moderate MAC, mild to moderate TR, dilated IVC -- [facility] 04/2024 showed: No obstructive coronary artery disease -- presented to the ED with worsening shortness of breath and LE edema. BNP elevated at 1,744. CXR with cardiomegaly and vascular congestion.  -- Dry weight at last appointment 186 pounds, reports she thinks dry weight at home close to  182lbs -- currently on IV Bumex  2 mg IV BID also received metolazone  2.5mg  (12/13), net - 5L. Volume status much improved -- will transition back to home torsemide  20mg  daily tomorrow   Hyperlipidemia -- Pravastatin  40 mg twice daily   History of monomorphic VT s/p CRT-D -- Follows closely with EP as an outpatient -- Recent device interrogation showed normal device function, AP/VP, 32 episodes of NSVT -- continue amiodarone  200 mg daily, quinidine 200 mg 3 times daily. Metoprolol  held on admission in the setting of acutely decompensated HF. BP remains soft.    Mild MR, moderate MAC Mild to moderate TR -- Echo 04/2024 showed Mild mitral regurgitation, moderate MAC  Per primary OSA on BiPAP Hypothyroidism Type 2 diabetes Electrolyte disturbances   For questions or updates, please contact [facility] Please consult [email or web address] for contact info under   Signed, Manuelita Rummer, NP

## 2024-08-08 ENCOUNTER — Telehealth (HOSPITAL_COMMUNITY): Payer: Self-pay

## 2024-08-08 ENCOUNTER — Other Ambulatory Visit (HOSPITAL_COMMUNITY): Payer: Self-pay

## 2024-08-08 DIAGNOSIS — I809 Phlebitis and thrombophlebitis of unspecified site: Secondary | ICD-10-CM

## 2024-08-08 LAB — BASIC METABOLIC PANEL WITH GFR
Anion gap: 10 (ref 5–15)
BUN: 19 mg/dL (ref 8–23)
CO2: 32 mmol/L (ref 22–32)
Calcium: 9.9 mg/dL (ref 8.9–10.3)
Chloride: 92 mmol/L — ABNORMAL LOW (ref 98–111)
Creatinine, Ser: 1.17 mg/dL — ABNORMAL HIGH (ref 0.44–1.00)
GFR, Estimated: 49 mL/min — ABNORMAL LOW (ref >60)
Glucose, Bld: 139 mg/dL — ABNORMAL HIGH (ref 70–99)
Potassium: 4 mmol/L (ref 3.5–5.1)
Sodium: 134 mmol/L — ABNORMAL LOW (ref 135–145)

## 2024-08-08 MED ORDER — CEFUROXIME AXETIL 500 MG PO TABS
500.0000 mg | ORAL_TABLET | Freq: Two times a day (BID) | ORAL | 0 refills | Status: AC
  Filled 2024-08-08 (×2): qty 10, 5d supply, fill #0

## 2024-08-08 MED ORDER — TORSEMIDE 20 MG PO TABS
20.0000 mg | ORAL_TABLET | Freq: Every day | ORAL | 0 refills | Status: DC
  Filled 2024-08-08: qty 30, 30d supply, fill #0

## 2024-08-08 MED ORDER — CEFUROXIME AXETIL 250 MG PO TABS
500.0000 mg | ORAL_TABLET | Freq: Two times a day (BID) | ORAL | Status: DC
  Filled 2024-08-08 (×2): qty 2

## 2024-08-08 NOTE — Telephone Encounter (Signed)
 Pharmacy Patient Advocate Encounter  Received notification from [facility] that Prior Authorization for Ivabradine  HCl 5MG  tablets has been APPROVED from 08/08/24 to 08/23/25. Ran test claim, Copay is $0. This test claim was processed through [facility]- copay amounts may vary at other pharmacies due to pharmacy/plan contracts, or as the patient moves through the different stages of their insurance plan.   PA #/Case ID/Reference #: [id number]

## 2024-08-08 NOTE — Progress Notes (Signed)
 Reviewed AVS, patient expressed understanding of medications, MD follow up reviewed.   Removed IV, Site clean, dry and intact.  Patient states all belongings brought to the hospital at time of admission are accounted for and packed to take home.  Picked up medications from [facility] pharmacy. Vol. Transport contacted to transport patient to entrance A, where family member was waiting in vehicle to transport home.

## 2024-08-08 NOTE — Assessment & Plan Note (Signed)
 Right dorsal hand with phlebitis, at the site where IV was located.  Today continue with erythema and tender to palpation.  Plan for antibiotic therapy with cefuroxime  for 5 days.

## 2024-08-08 NOTE — Progress Notes (Signed)
°  Progress Note  Patient Name: Tanya Hogan Date of Encounter: 08/08/2024 [facility] Cardiologist: Redell Shallow, MD   Interval Summary    Sitting up in the chair, feels ok today. Asking about going home. Some redness noted on the dorsum on right hand from prior IV placement.   Vital Signs Vitals:   08/08/24 0802 08/08/24 0847 08/08/24 1003 08/08/24 1058  BP: 104/69 (!) 96/41 106/63 105/67  Pulse: 89  89 90  Resp: 20 20 20 17   Temp:    97.6 F (36.4 C)  TempSrc:    Oral  SpO2: 96%   95%  Weight:      Height:        Intake/Output Summary (Last 24 hours) at 08/08/2024 1106 Last data filed at 08/08/2024 0835 Gross per 24 hour  Intake 594 ml  Output 1800 ml  Net -1206 ml      08/08/2024    6:11 AM 08/07/2024    3:25 AM 08/06/2024    1:34 AM  Last 3 Weights  Weight (lbs) 175 lb 11.3 oz 177 lb 0.5 oz 184 lb 4.9 oz  Weight (kg) 79.7 kg 80.3 kg 83.6 kg      Telemetry/ECG   AV paced, Occ PVC - Personally Reviewed  Physical Exam  GEN: No acute distress.   Neck: No JVD Cardiac: RRR, no murmurs, rubs, or gallops.  Respiratory: Clear to auscultation bilaterally. GI: Soft, nontender, non-distended  MS: No edema  Assessment & Plan   73 y.o. female with hypertension, type 2 diabetes mellitus, nonischemic cardiomyopathy with chronic systolic heart failure, s/p CRT-D, NASH, OSA, GERD admitted with shortness of breath.    Acute on Chronic HFrEF -- Echocardiogram 04/2024 showed: LVEF 20 to 25%, global hypokinesis, G2 DD, normal RV systolic function, severely elevated PASP at 64.6 mmHg, severe biatrial enlargement, mild MR, moderate MAC, mild to moderate TR, dilated IVC -- [facility] 04/2024 showed: No obstructive coronary artery disease -- presented to the ED with worsening shortness of breath and LE edema. BNP elevated at 1,744. CXR with cardiomegaly and vascular congestion.  -- Dry weight at last appointment 186 pounds, reports she thinks dry weight at home close  to 182lbs -- transitioned to torsemide  20mg  daily, net - 6.2L. Volume status much improved -- GDMT: previously on metoprolol  but held on admission in the setting of soft BPs. Started on corlanor  5mg  daily yesterday, continue spiro. Has not tolerated Entresto/ACE/ARB given issues with hyperkalemia. Thinks she has tried SGLT2 previously and developed constipation    Hyperlipidemia -- Pravastatin  40 mg twice daily   History of monomorphic VT s/p CRT-D PVCs -- Follows closely with EP as an outpatient -- Recent device interrogation showed normal device function, AP/VP, 32 episodes of NSVT -- continue amiodarone  200 mg daily, quinidine 200 mg 3 times daily. Metoprolol  held on admission in the setting of acutely decompensated HF. BP remains soft.   Mild MR, moderate MAC Mild to moderate TR -- Echo 04/2024 showed Mild mitral regurgitation, moderate MAC   Per primary OSA on BiPAP Hypothyroidism Type 2 diabetes Electrolyte disturbances    For questions or updates, please contact [facility] Please consult [email or web address] for contact info under       Signed, Manuelita Rummer, NP

## 2024-08-08 NOTE — Progress Notes (Signed)
 Mobility Specialist Progress Note:    08/08/24 1533  Mobility  Activity Ambulated with assistance  Level of Assistance Standby assist, set-up cues, supervision of patient - no hands on  Assistive Device Front wheel Bee  Distance Ambulated (ft) 200 ft  Range of Motion/Exercises Active  Activity Response Tolerated well  Mobility Referral Yes  Mobility visit 1 Mobility  Mobility Specialist Start Time (ACUTE ONLY) 1533  Mobility Specialist Stop Time (ACUTE ONLY) 1555  Mobility Specialist Time Calculation (min) (ACUTE ONLY) 22 min   Received pt sitting in recliner agreeable to session. No c/o any symptoms. Pt moving and ambulating well. Returned pt to room w/ all needs met.   Venetia Keel Mobility Specialist Please Neurosurgeon or Rehab Office at [phone]

## 2024-08-08 NOTE — Telephone Encounter (Signed)
 Pharmacy Patient Advocate Encounter   Received notification from Inpatient Request that prior authorization for Ivabradine  HCl 5MG  tablets is required/requested.   Insurance verification completed.   The patient is insured through [facility].   Per test claim: PA required; PA submitted to above mentioned insurance via Latent Key/confirmation #/EOC [id number] Status is pending

## 2024-08-08 NOTE — TOC Transition Note (Addendum)
 Transition of Care [facility]) - Discharge Note   Patient Details  Name: Tanya Hogan MRN: [id number] Date of Birth: [birth year]  Transition of [facility] CM/SW Contact:  Waddell Barnie Rama, RN Phone Number: 08/08/2024, 3:15 PM   Clinical Narrative:    For dc today, she has transportation.  Patient states she does not have a legal guradian, and has never had one.   Final next level of care: Home w Home Health Services Barriers to Discharge: Continued Medical Work up   Patient Goals and CMS Choice Patient states their goals for this hospitalization and ongoing recovery are:: return home CMS Medicare.gov Compare Post Acute Care list provided to:: Patient Choice offered to / list presented to : Patient      Discharge Placement                       Discharge Plan and Services Additional resources added to the After Visit Summary for   In-house Referral: NA Discharge Planning Services: CM Consult Post Acute Care Choice: Home Health          DME Arranged: N/A DME Agency: NA       HH Arranged: NA          Social Drivers of Health (SDOH) Interventions SDOH Screenings   Food Insecurity: No Food Insecurity (08/03/2024)  Housing: Low Risk (08/03/2024)  Transportation Needs: Unmet Transportation Needs (08/03/2024)  Utilities: Not At Risk (08/03/2024)  Recent Concern: Utilities - At Risk (07/07/2024)   Received from [facility]  Alcohol  Screen: Low Risk (08/10/2023)  Depression (PHQ2-9): Low Risk (07/24/2024)  Financial Resource Strain: Low Risk (07/07/2024)   Received from [facility]  Physical Activity: Sufficiently Active (08/10/2023)  Recent Concern: Physical Activity - Insufficiently Active (07/12/2023)  Social Connections: Moderately Integrated (08/03/2024)  Stress: No Stress Concern Present (06/22/2024)   Received from [facility]  Tobacco Use: Medium Risk (08/02/2024)  Health Literacy: Adequate Health Literacy (08/10/2023)     Readmission  Risk Interventions    08/04/2024    1:33 PM 07/14/2024   11:11 AM  Readmission Risk Prevention Plan  Transportation Screening Complete Complete  PCP or Specialist Appt within 3-5 Days Complete Complete  HRI or Home Care Consult Complete Complete  Palliative Care Screening Not Applicable Not Applicable  Medication Review (RN Care Manager) Complete Complete

## 2024-08-08 NOTE — Discharge Summary (Signed)
 Physician Discharge Summary   Patient: Tanya Hogan MRN: [id number] DOB: 10-01-1950  Admit date:     08/02/2024  Discharge date: 08/08/2024  Discharge Physician: Elidia Sieving Juli Odom   PCP: [name], MD   Recommendations at discharge:    Patient will continue torsemide  20 mg daily for diuresis, to increase to 40 mg in case of volume overload, weight gain 2 to 3 lbs in 24 hrs or 5 lbs in 7 days.  Limited guideline directed medical therapy due to risk of hypotension, continue spironolactone  for RAAS inhibition.  Follow up renal function and electrolytes in 7 days as outpatient  Follow up with Dr Alvan in 7 to 10 days Follow up with Cardiology as scheduled   Discharge Diagnoses: Principal Problem:   Acute on chronic systolic CHF (congestive heart failure) (HCC) Active Problems:   Essential hypertension   Hypokalemia   COPD (chronic obstructive pulmonary disease) (HCC)   Type 2 diabetes mellitus with hyperlipidemia (HCC)   Hypothyroidism   OSA (obstructive sleep apnea)   Gastroesophageal reflux disease   Obesity, Class I, BMI 30-34.9   High risk medication use   At risk for allergic reaction to medication  Resolved Problems:   * No resolved hospital problems. [facility] Course: Tanya Hogan was admitted to the hospital with the working diagnosis of heart failure exacerbation   73 yo female with the past medical history of hypertension, heart failure, T2DM and obesity who presented with dyspnea and edema.  Noted worsening edema for the last 8 days prior to admission, along with rapid weight gain, to the point it where she was having difficulty ambulating. Because severe symptoms she called EMS. She as found edematous and was transported to the ED.  On her initial physical examination her blood pressure was 96/62, 115/76, HR 88, RR 18 and 02 saturation 96% Lungs with rales at bases with no wheezing, heart with S1 and S2 present and regular with no gallops, rubs  or murmurs, abdomen with no distention, positive lower extremity edema. ++  Na 139, K 3,2 Cl 102 bicarbonate 27 glucose 109 bun 20 cr 0,99  BNP 1,744  High sensitive troponin 19 and 21  Wbc 7,7 hgb 12.2 plt 231   Chest radiograph hypoinflation with cardiomegaly bilateral hilar vascular congestion, pacemaker defibrillator in place with one right atrial, one left ventricular and one right trial lead.   EKG 89 bpm, normal axis, qtc 511, right bundle branch block, with atrial and ventricular pacing, with no significant ST segment or  T wave changes.   Patient was placed on IV loop diuretic for diuresis.   12/13 continue volume overloaded, added one dose of metolazone   12/14 improved diuresis but continue volume overloaded.  12/15 volume status continue to improve.  12/16 euvolemic state, plan to follow up as outpatient.   Assessment and Plan: * Acute on chronic systolic CHF (congestive heart failure) (HCC) Echocardiogram with reduced LV systolic function EF 20 to 25%, global hypokinesis, LV cavity with moderate dilatation, grade II diastolic dysfunction with pseudo normalization EA, RV systolic function preserved, RVSP 64.6 mmHg, LA and RA with severe dilatation, mild to moderate tricuspid regurgitation,   Acute on chronic cor pulmonale  Pulmonary hypertension  Patient was placed on IV furosemide  for diuresis, plus one dose of oral metolazone  2,5 mg, negative fluid balance was achieved, - 6,258 ml, with significant improvement in her symptoms.   Plan to continue loop diuretic with torsemide  20 mg po daily.  Holding isosorbide   and metoprolol  due to risk of hypotension  Not candidate to SGLT 2 inh due to risk of urine infections.  Limited medical therapy due to risk of hypotension  History of ventricular tachycardia, continue with amiodarone  200 mg po daily and quinidine   Essential hypertension Continue blood pressure control with spironolactone   Continue isosorbide .    Hypokalemia At the time of her discharge her renal function has a serum cr of 1,1 with K at 4,0 and serum bicarbonate at 32 Na 134   Continue diuresis with torsemide  and spironolactone ,  Follow up renal function and electrolytes as outpatient   COPD (chronic obstructive pulmonary disease) (HCC) No signs of acute exacerbation  Continue bronchodilator therapy   Type 2 diabetes mellitus with hyperlipidemia (HCC) Patient was placed on insulin  sliding scale for glucose cover and monitoring  Fasting glucose today 139 mg/dl  Continue statin   Hypothyroidism Continue levothyroxine    OSA (obstructive sleep apnea) cpap  Phlebitis Right dorsal hand with phlebitis, at the site where IV was located.  Today continue with erythema and tender to palpation.  Plan for antibiotic therapy with cefuroxime  for 5 days.   Gastroesophageal reflux disease Continue pantoprazole    Obesity, Class I, BMI 30-34.9 Calculated BMI is 33.1        Consultants: cardiology  Procedures performed: none   Disposition: Home Diet recommendation:  Cardiac and Carb modified diet DISCHARGE MEDICATION: Allergies as of 08/08/2024       Reactions   Injectafer [ferric Carboxymaltose] Shortness Of Breath   Mild SOB following injectafer infusion   Penicillins Anaphylaxis, Rash   Accupril [quinapril Hcl] Other (See Comments)   Hyperkalemia=high potassium level   Celebrex [celecoxib] Other (See Comments)   Hx hematemesis, bleeding ulcer   Cozaar  [losartan  Potassium] Itching   Dml Forte Rash   Entresto [sacubitril-valsartan] Other (See Comments)   Hyperkalemia   Flonase  [fluticasone ] Other (See Comments)   Hx nasal ulcer   Lactose Intolerance (gi) Diarrhea   Lipitor [atorvastatin ] Other (See Comments)   Myalgias    Nasonex  [mometasone  Furoate] Other (See Comments)   Epistaxis    Nsaids Other (See Comments)   Hx hematemesis, bleeding ulcer   Advil [ibuprofen] Nausea And Vomiting   Hx hematemesis,  bleeding ulcer   Baking Soda-fluoride [sodium Fluoride] Itching, Rash   Bayer Aspirin  [aspirin ] Other (See Comments)   Hx hematemesis, bleeding ulcer   Chamomile Hives   Clindamycin /lincomycin Rash   Codeine Nausea And Vomiting   Lasix  [furosemide ] Other (See Comments)   Tinnitus  Hypotension   Miralax  [polyethylene Glycol (macrogol)] Other (See Comments)   Leaky gut syndrome   Quinine Rash   Reclast  [zoledronic  Acid] Rash, Other (See Comments)   Tremors    Rifadin [rifampin] Other (See Comments)   Unknown reaction   Singulair  [montelukast ] Other (See Comments)   Dizziness    Sulfa Antibiotics Rash   Ultram  [tramadol ] Anxiety, Rash   Vesicare  [solifenacin ] Other (See Comments)   Urinary retention   Wellbutrin  [bupropion ] Other (See Comments)   Myalgias  Skin crawling sensation   Wool Alcohol  [lanolin] Rash        Medication List     STOP taking these medications    carvedilol  3.125 MG tablet Commonly known as: COREG    estradiol 0.01 % Crea vaginal cream Commonly known as: ESTRACE   Gemtesa 75 MG Tabs Generic drug: Vibegron   isosorbide  mononitrate 30 MG 24 hr tablet Commonly known as: IMDUR    lubiprostone  24 MCG capsule Commonly known as: AMITIZA   metoprolol  succinate 50 MG 24 hr tablet Commonly known as: TOPROL -XL   mexiletine 150 MG capsule Commonly known as: MEXITIL    potassium chloride  10 MEQ CR capsule Commonly known as: MICRO-K    triamcinolone  0.1 % paste Commonly known as: KENALOG        TAKE these medications    albuterol  (2.5 MG/3ML) 0.083% nebulizer solution Commonly known as: PROVENTIL  Take 2.5 mg by nebulization daily as needed for wheezing or shortness of breath.   albuterol  108 (90 Base) MCG/ACT inhaler Commonly known as: VENTOLIN  HFA Inhale 2 puffs into the lungs every 6 (six) hours as needed for wheezing or shortness of breath.   alendronate  70 MG tablet Commonly known as: FOSAMAX  TAKE 1 TABLET BY MOUTH EVERY 7  DAYS WITH  FULL GLASS OF WATER  ON AN EMPTY STOMACH   amiodarone  200 MG tablet Commonly known as: PACERONE  Take 1 tablet (200 mg total) by mouth daily.   aspirin  81 MG chewable tablet Chew 81 mg by mouth daily.   Calcium  600+D Plus Minerals 600-400 MG-UNIT Tabs 1 tab p.o. twice daily What changed:  how much to take how to take this when to take this additional instructions   cefUROXime  500 MG tablet Commonly known as: CEFTIN  Take 1 tablet (500 mg total) by mouth 2 (two) times daily with a meal for 5 days.   EPINEPHrine  0.3 mg/0.3 mL Soaj injection Commonly known as: EPI-PEN Inject 0.3 mg into the muscle as needed for anaphylaxis.   fexofenadine  180 MG tablet Commonly known as: ALLEGRA  Take 180 mg by mouth daily.   glucose blood test strip To be used to check blood glucose Dx:E11.8 FreeStyle Precision blood glucose for Freestyle Libre 2   levothyroxine  112 MCG tablet Commonly known as: SYNTHROID  TAKE 1 TABLET BY MOUTH DAILY   metFORMIN  500 MG 24 hr tablet Commonly known as: GLUCOPHAGE -XR TAKE 1 TABLET BY MOUTH TWICE  DAILY WITH MEALS What changed: when to take this   Multivitamin Women 50+ Tabs Take 1 tablet by mouth daily.   pantoprazole  40 MG tablet Commonly known as: PROTONIX  TAKE 1 TABLET BY MOUTH DAILY  BEFORE BREAKFAST   pravastatin  40 MG tablet Commonly known as: PRAVACHOL  Take 40 mg by mouth at bedtime.   quiNIDine sulfate  200 MG tablet Take 1 tablet (200 mg total) by mouth 3 (three) times daily.   Spiriva  Respimat 2.5 MCG/ACT Aers Generic drug: Tiotropium Bromide  Inhale 2 puffs into the lungs daily.   spironolactone  25 MG tablet Commonly known as: ALDACTONE  Take 0.5 tablets (12.5 mg total) by mouth daily.   torsemide  20 MG tablet Commonly known as: DEMADEX  Take 1 tablet (20 mg total) by mouth daily. Take 2 tablets daily in case of weight gain 2 to 3 lbs in 24 hrs or 5 lbs in 7 days What changed: additional instructions   valACYclovir  1000 MG  tablet Commonly known as: VALTREX  Take 1 tablet (1,000 mg total) by mouth 2 (two) times daily. What changed:  when to take this reasons to take this   Wixela Inhub 250-50 MCG/ACT Aepb Generic drug: fluticasone -salmeterol Inhale 1 puff into the lungs in the morning and at bedtime.        Follow-up Information     [name], MD Follow up on 08/14/2024.   Specialty: Family Medicine Why: 2:40 for hospital follow up, please arrive 15 mins early, Contact information: [address] [place] [address] [phone]  Discharge Exam: Filed Weights   08/06/24 0134 08/07/24 0325 08/08/24 0611  Weight: 83.6 kg 80.3 kg 79.7 kg   BP 105/67 (BP Location: Left Arm)   Pulse 90   Temp 97.6 F (36.4 C) (Oral)   Resp 17   Ht 5' 1 (1.549 m)   Wt 79.7 kg   SpO2 95%   BMI 33.20 kg/m   Patient with no chest pain and no dyspnea, no PND, orthopnea or lower extremity edema  Neurology awake and alert ENT with mild pallor  Cardiovascular with S1 and S2 present and regular with no gallops or rubs, no murmurs Respiratory with no rales or wheezing, no rhonchi  Abdomen soft and non tender, not distended Trace lower extremity edema, non pitting, possible early lymphedema.   Condition at discharge: stable  The results of significant diagnostics from this hospitalization (including imaging, microbiology, ancillary and laboratory) are listed below for reference.   Imaging Studies: CUP PACEART REMOTE DEVICE CHECK Result Date: 08/03/2024 ICD: Scheduled remote reviewed. Normal device function.  Presenting rhythm: AP/VP 32 NSVT, V>A, V-rate 125-167 bpm, duration 1-2 sec Next remote transmission per protocol. ML, CVRS  DG Chest 2 View Result Date: 08/02/2024 CLINICAL DATA:  Chest pressure. EXAM: CHEST - 2 VIEW COMPARISON:  Chest radiograph dated 06/05/2024 FINDINGS: There is cardiomegaly with vascular congestion. No focal consolidation, pleural  effusion, pneumothorax. Left pectoral AICD device. No acute osseous pathology. Right shoulder arthroplasty. IMPRESSION: Cardiomegaly with vascular congestion. No focal consolidation. Electronically Signed   By: Vanetta Chou M.D.   On: 08/02/2024 20:37   NM Hepatobiliary Liver Func Result Date: 07/14/2024 EXAM: NM HEPATOBILLARY SCAN 07/14/2024 01:26:50 PM TECHNIQUE: RADIOPHARMACEUTICAL: 5.26 mCi Tc-73m mebrofenin  Dynamic images of the abdomen and pelvis were obtained in the anterior projection for 1 hour after intravenous administration of radiopharmaceutical. COMPARISON: None available. CLINICAL HISTORY: Cholelithiasis. FINDINGS: Homogenous uptake within the liver. Normal clearance of the blood pool. Appropriate excretion into the biliary system. Gallbladder begins to fill at 20 minutes. Activity is evident in the small bowel by 40 minutes. Findings consistent with patent cystic duct and common bile duct. IMPRESSION: 1. No scintigraphic evidence of acute cholecystitis. Electronically signed by: Norleen Boxer MD 07/14/2024 02:54 PM EST RP Workstation: [id number]   CT Angio Chest PE W and/or Wo Contrast Result Date: 07/13/2024 CLINICAL DATA:  High probability for PE.  CHF exacerbation. EXAM: CT ANGIOGRAPHY CHEST WITH CONTRAST TECHNIQUE: Multidetector CT imaging of the chest was performed using the standard protocol during bolus administration of intravenous contrast. Multiplanar CT image reconstructions and MIPs were obtained to evaluate the vascular anatomy. RADIATION DOSE REDUCTION: This exam was performed according to the departmental dose-optimization program which includes automated exposure control, adjustment of the mA and/or kV according to patient size and/or use of iterative reconstruction technique. CONTRAST:  75mL OMNIPAQUE  IOHEXOL  350 MG/ML SOLN COMPARISON:  CT of the chest 04/23/2023. FINDINGS: Cardiovascular: Aorta is normal in size. Heart is enlarged. Left-sided pacemaker is present. There  is a small pericardial effusion. There is adequate opacification of the pulmonary arteries to the segmental level. No pulmonary embolism identified. Mediastinum/Nodes: There are nonenlarged mediastinal lymph nodes diffusely, increased in size and number compared to prior study. Mildly enlarged right hilar lymph nodes have increased from prior measuring up to 10 mm. Lungs/Pleura: There is no pleural effusion or pneumothorax. There some smooth interlobular septal thickening in the lung bases with mild ground-glass opacities. There is a band of atelectasis in the left lower lobe. Upper Abdomen: No acute abnormality.  Musculoskeletal: Right mastectomy changes are present. There are calcifications in the left breast. Right shoulder arthroplasty is present. Review of the MIP images confirms the above findings. IMPRESSION: 1. No evidence for pulmonary embolism. 2. Cardiomegaly with small pericardial effusion. 3. Mild interstitial edema. 4. Mild mediastinal and right hilar lymphadenopathy, increased from prior. This may be reactive. Electronically Signed   By: Greig Pique M.D.   On: 07/13/2024 15:43   CT ABDOMEN PELVIS W CONTRAST Result Date: 07/13/2024 EXAM: CT ABDOMEN AND PELVIS WITH CONTRAST 07/13/2024 03:19:47 PM TECHNIQUE: CT of the abdomen and pelvis was performed with the administration of 75 mL of iohexol  (OMNIPAQUE ) 350 MG/ML injection. Multiplanar reformatted images are provided for review. Automated exposure control, iterative reconstruction, and/or weight-based adjustment of the mA/kV was utilized to reduce the radiation dose to as low as reasonably achievable. COMPARISON: 11/29/2023 CLINICAL HISTORY: Abdominal pain, acute, nonlocalized; Upper abdominal pain and chest pain, pleuritic symptoms. Shortness of breath. Cannot lay flat. Concern for heart failure or other intra-abdominal pathology versus PE versus fluid overload/pneumonia. FINDINGS: LOWER CHEST: Mild septal thickening seen in visualized lung bases  concerning for edema. LIVER: The liver is unremarkable. GALLBLADDER AND BILE DUCTS: Moderate gallbladder wall thickening is noted with cholelithiasis concerning for possible cholecystitis. Ultrasound is recommended for further evaluation. No biliary ductal dilatation. SPLEEN: No acute abnormality. PANCREAS: No acute abnormality. ADRENAL GLANDS: No acute abnormality. KIDNEYS, URETERS AND BLADDER: No stones in the kidneys or ureters. No hydronephrosis. No perinephric or periureteral stranding. Urinary bladder is unremarkable. GI AND BOWEL: Stomach demonstrates no acute abnormality. There is no bowel obstruction. Diverticulosis of descending and sigmoid colon is noted without inflammation. PERITONEUM AND RETROPERITONEUM: No ascites. No free air. VASCULATURE: Aorta is normal in caliber. LYMPH NODES: No lymphadenopathy. REPRODUCTIVE ORGANS: Status post hysterectomy. BONES AND SOFT TISSUES: No acute osseous abnormality. No focal soft tissue abnormality. IMPRESSION: 1. Moderate gallbladder wall thickening with cholelithiasis, suspicious for acute cholecystitis; ultrasound is recommended for further evaluation. 2. Mild septal thickening in the visualized lung bases, suspicious for edema. 3. Diverticulosis of the descending and sigmoid colon without inflammation. Electronically signed by: Lynwood Seip MD 07/13/2024 03:36 PM EST RP Workstation: [id number]    Microbiology: Results for orders placed or performed during the hospital encounter of 07/13/24  Resp panel by RT-PCR (RSV, Flu A&B, Covid) Anterior Nasal Swab     Status: None   Collection Time: 07/13/24  2:04 PM   Specimen: Anterior Nasal Swab  Result Value Ref Range Status   SARS Coronavirus 2 by RT PCR NEGATIVE NEGATIVE Final   Influenza A by PCR NEGATIVE NEGATIVE Final   Influenza B by PCR NEGATIVE NEGATIVE Final    Comment: (NOTE) The Xpert Xpress SARS-CoV-2/FLU/RSV plus assay is intended as an aid in the diagnosis of influenza from Nasopharyngeal swab  specimens and should not be used as a sole basis for treatment. Nasal washings and aspirates are unacceptable for Xpert Xpress SARS-CoV-2/FLU/RSV testing.  Fact Sheet for Patients: bloggercourse.com  Fact Sheet for Healthcare Providers: seriousbroker.it  This test is not yet approved or cleared by the [place]  FDA and has been authorized for detection and/or diagnosis of SARS-CoV-2 by FDA under an Emergency Use Authorization (EUA). This EUA will remain in effect (meaning this test can be used) for the duration of the COVID-19 declaration under Section 564(b)(1) of the Act, 21 U.S.C. section 360bbb-3(b)(1), unless the authorization is terminated or revoked.     Resp Syncytial Virus by PCR NEGATIVE NEGATIVE Final    Comment: (  NOTE) Fact Sheet for Patients: bloggercourse.com  Fact Sheet for Healthcare Providers: seriousbroker.it  This test is not yet approved or cleared by the [place]  FDA and has been authorized for detection and/or diagnosis of SARS-CoV-2 by FDA under an Emergency Use Authorization (EUA). This EUA will remain in effect (meaning this test can be used) for the duration of the COVID-19 declaration under Section 564(b)(1) of the Act, 21 U.S.C. section 360bbb-3(b)(1), unless the authorization is terminated or revoked.  Performed at [facility], [address] [place], [place] [address]   Urine Culture     Status: Abnormal   Collection Time: 07/13/24  2:35 PM   Specimen: Urine, Random  Result Value Ref Range Status   Specimen Description URINE, RANDOM  Final   Special Requests   Final    NONE Reflexed from [id number] Performed at [facility], [address], [place], [place] [address]    Culture (A)  Final    80,000 COLONIES/mL MULTIPLE SPECIES PRESENT, SUGGEST RECOLLECTION   Report Status 07/14/2024 FINAL  Final  MRSA Next Gen by PCR,  Nasal     Status: Abnormal   Collection Time: 07/13/24 11:23 PM   Specimen: Nasal Mucosa; Nasal Swab  Result Value Ref Range Status   MRSA by PCR Next Gen DETECTED (A) NOT DETECTED Final    Comment: (NOTE) The GeneXpert MRSA Assay (FDA approved for NASAL specimens only), is one component of a comprehensive MRSA colonization surveillance program. It is not intended to diagnose MRSA infection nor to guide or monitor treatment for MRSA infections. Test performance is not FDA approved in patients less than 38 years old. Performed at [facility], [address] [place], [place] [address]    *Note: Due to a large number of results and/or encounters for the requested time period, some results have not been displayed. A complete set of results can be found in Results Review.    Labs: CBC: Recent Labs  Lab 08/02/24 1942  WBC 7.7  HGB 12.2  HCT 39.0  MCV 99.5  PLT 231   Basic Metabolic Panel: Recent Labs  Lab 08/04/24 0205 08/05/24 0211 08/06/24 0151 08/07/24 0210 08/08/24 0136  NA 139 139 137 136 134*  K 4.1 3.4* 3.8 4.2 4.0  CL 100 102 94* 96* 92*  CO2 34* 28 31 31  32  GLUCOSE 125* 103* 109* 104* 139*  BUN 21 16 13 12 19   CREATININE 1.21* 0.95 0.99 1.04* 1.17*  CALCIUM  9.4 9.2 9.3 9.4 9.9  MG  --  1.9 2.3  --   --    Liver Function Tests: No results for input(s): AST, ALT, ALKPHOS, BILITOT, PROT, ALBUMIN in the last 168 hours. CBG: No results for input(s): GLUCAP in the last 168 hours.  Discharge time spent: greater than 30 minutes.  Signed: Elidia Toribio Furnace, MD [facility] 08/08/2024

## 2024-08-09 ENCOUNTER — Telehealth: Payer: Self-pay

## 2024-08-09 NOTE — Transitions of Care (Post Inpatient/ED Visit) (Signed)
 08/09/2024  Name: Tanya Hogan MRN: [id number] DOB: Jun 09, 1951  Today's TOC FU Call Status: Today's TOC FU Call Status:: Successful TOC FU Call Completed TOC FU Call Complete Date: 08/09/24  Patient's Name and Date of Birth confirmed. DOB, Name  Transition Care Management Follow-up Telephone Call Date of Discharge: 08/08/24 Discharge Facility: [name]) Type of Discharge: Inpatient Admission Primary Inpatient Discharge Diagnosis:: Acute on chronic CHF How have you been since you were released from the hospital?: Better Any questions or concerns?: No  Items Reviewed: Did you receive and understand the discharge instructions provided?: Yes Medications obtained,verified, and reconciled?: Yes (Medications Reviewed) Any new allergies since your discharge?: No Dietary orders reviewed?: Yes Type of Diet Ordered:: Cardiac and Carb modified diet Do you have support at home?: Yes People in Home [RPT]: child(ren), adult Name of Support/Comfort Primary Source: Daughter assists as needed - brother lives in the home and has Parkinsons  Medications Reviewed Today: Medications Reviewed Today     Reviewed by Lauro Shona LABOR, RN (Registered Nurse) on 08/09/24 at 0930  Med List Status: <None>   Medication Order Taking? Sig Documenting Provider Last Dose Status Informant  albuterol  (PROVENTIL ) (2.5 MG/3ML) 0.083% nebulizer solution [id number] Yes Take 2.5 mg by nebulization daily as needed for wheezing or shortness of breath. [provider]  Active Self, Pharmacy Records  albuterol  (VENTOLIN  HFA) 108 [id number] Base) MCG/ACT inhaler [id number] Yes Inhale 2 puffs into the lungs every 6 (six) hours as needed for wheezing or shortness of breath. [name], MD  Active Self, Pharmacy Records  alendronate  (FOSAMAX ) 70 MG tablet [id number] Yes TAKE 1 TABLET BY MOUTH EVERY 7  DAYS WITH FULL GLASS OF WATER  ON AN EMPTY STOMACH [name], MD  Active Self, Pharmacy Records            Med Note (COFFELL, JON HERO   Mon Jun 05, 2024  2:24 PM)    amiodarone  (PACERONE ) 200 MG tablet [id number] Yes Take 1 tablet (200 mg total) by mouth daily. [name], MD  Active Self, Pharmacy Records  aspirin  81 MG chewable tablet [id number] Yes Chew 81 mg by mouth daily. [provider]  Active Self, Pharmacy Records  Calcium  Carbonate-Vit D-Min (CALCIUM  600+D PLUS MINERALS) 600-400 MG-UNIT TABS [id number] Yes 1 tab p.o. twice daily Curtis Debby PARAS, MD  Active Self, Pharmacy Records  cefUROXime  (CEFTIN ) 500 MG tablet [id number] Yes Take 1 tablet (500 mg total) by mouth 2 (two) times daily with a meal for 5 days. Arrien, Elidia Sieving, MD  Active   EPINEPHrine  0.3 mg/0.3 mL IJ SOAJ injection [id number] Yes Inject 0.3 mg into the muscle as needed for anaphylaxis. [name], MD  Active Self, Pharmacy Records           Med Note (CRUTHIS, CHLOE C   Thu Aug 03, 2024 12:50 PM) Never used per pt.   fexofenadine  (ALLEGRA ) 180 MG tablet [id number] Yes Take 180 mg by mouth daily. [provider]  Active Self, Pharmacy Records  fluticasone -salmeterol [facility] INHUB) 250-50 MCG/ACT AEPB [id number] Yes Inhale 1 puff into the lungs in the morning and at bedtime. [provider]  Active Self, Pharmacy Records  glucose blood test strip [id number] Yes To be used to check blood glucose Dx:E11.8 FreeStyle Precision blood glucose for Freestyle Libre 2 [name], MD  Active Self, Pharmacy Records  levothyroxine  (SYNTHROID ) 112 MCG tablet [id number] Yes TAKE 1 TABLET BY MOUTH DAILY [name], MD  Active  Self, Pharmacy Records  metFORMIN  (GLUCOPHAGE -XR) 500 MG 24 hr tablet [id number] Yes TAKE 1 TABLET BY MOUTH TWICE  DAILY WITH MEALS  Patient taking differently: Take 500 mg by mouth daily. Per 08/09/24 discharge states 1 tablet twice daily - patient states PCP has her on this once daily and they plan to discuss once or twice daily at 08/14/24  appt   [name], MD  Active Self, Pharmacy Records           Med Note (CRUTHIS, CHLOE C   Thu Aug 03, 2024 12:50 PM) Dose change per pt.   Multiple Vitamins-Minerals (MULTIVITAMIN WOMEN 50+) TABS [id number] Yes Take 1 tablet by mouth daily.  Patient taking differently: Take 1 tablet by mouth daily. Patient states she takes Multivitamin Adult  that has iron and not the women's 50 plus   [provider]  Active Pharmacy Records, Self  pantoprazole  (PROTONIX ) 40 MG tablet [id number] Yes TAKE 1 TABLET BY MOUTH DAILY  BEFORE BREAKFAST [name], MD  Active Self, Pharmacy Records           Med Note (CRUTHIS, CHLOE C   Thu Aug 03, 2024 12:51 PM) Pt verified she is taking 40 mg once daily instead of BID as prescribed.   pravastatin  (PRAVACHOL ) 40 MG tablet [id number] Yes Take 40 mg by mouth at bedtime. [provider]  Active Self, Pharmacy Records  quiNIDine sulfate  200 MG tablet [id number] Yes Take 1 tablet (200 mg total) by mouth 3 (three) times daily. Aniceto Daphne CROME, NP  Active Self, Pharmacy Records  SPIRIVA  RESPIMAT 2.5 MCG/ACT AERS [id number] Yes Inhale 2 puffs into the lungs daily. [provider]  Active Self, Pharmacy Records  spironolactone  (ALDACTONE ) 25 MG tablet [id number] Yes Take 0.5 tablets (12.5 mg total) by mouth daily. Pietro Redell RAMAN, MD  Active Self, Pharmacy Records  torsemide  (DEMADEX ) 20 MG tablet [id number] Yes Take 1 tablet (20 mg total) by mouth daily. Take 2 tablets daily in case of weight gain 2 to 3 lbs in 24 hrs or 5 lbs in 7 days Arrien, Elidia Sieving, MD  Active   valACYclovir  (VALTREX ) 1000 MG tablet [id number]  Take 1 tablet (1,000 mg total) by mouth 2 (two) times daily.  Patient not taking: Reported on 08/09/2024   Alvia Bring, DO  Active Self, Pharmacy Records            Home Care and Equipment/Supplies: Were Home Health Services Ordered?: Yes Name of Home Health Agency:: [facility] Has Agency set up a time  to come to your home?: No (Patient states she just got home yesterday evening - will call to find out when they are coming out) Any new equipment or medical supplies ordered?: No  Functional Questionnaire: Do you need assistance with bathing/showering or dressing?: No Do you need assistance with meal preparation?: No Do you need assistance with eating?: No Do you have difficulty maintaining continence: Yes (occasional bladder incontinence when out) Do you need assistance with getting out of bed/getting out of a chair/moving?: No Do you have difficulty managing or taking your medications?: No  Follow up appointments reviewed: PCP Follow-up appointment confirmed?: Yes Date of PCP follow-up appointment?: 08/14/24 Follow-up Provider: PCP, [facility] Follow-up appointment confirmed?: Yes Date of Specialist follow-up appointment?: 08/18/24 Follow-Up Specialty Provider:: [facility] Do you need transportation to your follow-up appointment?: No (Insurance helps when she can't get someone  else) Do you understand care options if your condition(s) worsen?: Yes-patient  verbalized understanding  SDOH Interventions Today    Flowsheet Row Most Recent Value  SDOH Interventions   Food Insecurity Interventions Intervention Not Indicated  Housing Interventions Intervention Not Indicated  Transportation Interventions Other (Comment)  [Patient has received an application for transportation per 08/02/24 BSW visit note and BSW is going to follow up 08/15/24]  Utilities Interventions Intervention Not Indicated    Goals Addressed             This Visit's Progress    VBCI Transitions of Care (TOC) Care Plan       Problems:  Prior Hospitalization for treatment of shortness of breath New hospitalization for acute on chronic CHF Home Health services barrier: patient states she just got home yesterday 08/08/24 evening and agrees to call [facility] home health if she  does not hear from them by the end of day 08/10/24 - states she is known to them  and SDOH barrier:  BWS, Orlean Fey spoke with patient 08/02/24 and provided information for transportation with [facility] and patient confirms she has the application - however, patient admitted 08/03/24 and discharged 08/08/24 so she has just looked at the application and will complete it and understands Orlean has her scheduled again 08/15/24   Goal:  Over the next 30 days, the patient will not experience hospital readmission  Interventions:  Transitions of Care: Doctor Visits  - discussed the importance of doctor visits Hospital f/u scheduled with PCP 08/14/24 and with heart and vascular and vascular 08/18/24 Reviewed in detail CHF management and importance of increasing Torsemide  to 2 tablets daily in case of weight gain 2 to 3 lbs in 24 hrs or 5 lbs in 7 day and notifying PCP or cardio or TOC RN  Reviewed with patient monitoring right wrist where she reports EMS IV site is red but appears to be improving with the Ceftin  Discussed with patient that she'd reported worsening edema for the last 8 days prior to admission - address this immediately with provider if edema starts to worsen again or if shortness of breath worsens - patient states she did call the office prior to be admitting and was told to take an extra Torsemide  - educated to closely monitor the weight, swelling, shortness of breath and notify provider at first sign of worsening and again if doesn't improve with whatever instructions she receives, even if she has to call daily to attempt to prevent re-hospitalization  Reviewed with patients page 1 of discharge summary with the medications she is to stop - patient confirmed she removed them from pill box   Heart Failure Interventions: Advised patient to weigh each morning after emptying bladder Discussed importance of daily weight and advised patient to weigh and record  daily Reviewed role of diuretics in prevention of fluid overload and management of heart failure Assessed social determinant of health barriers - see above  Patient Self Care Activities:  Attend all scheduled provider appointments Call pharmacy for medication refills 3-7 days in advance of running out of medications Call provider office for new concerns or questions  Notify RN Care Manager of TOC call rescheduling needs Participate in Transition of Care Program/Attend TOC scheduled calls Take medications as prescribed   call office if I gain more than 2 pounds in one day or 5 pounds in one week use salt in moderation watch for swelling in feet, ankles and legs every day weigh myself daily Monitor right wrist IV site - patient will take a picture today and compare  it tomorrow 08/10/24   Plan:  Telephone follow up appointment with care management team member scheduled for:  Quick Touch base re: right wrist IV site - ? If continues to improve with antibiotic  The patient has been provided with contact information for the care management team and has been advised to call with any health related questions or concerns.         Shona Prow RN, CCM [place]  VBCI-Population Health RN Care Manager [phone]

## 2024-08-09 NOTE — Patient Instructions (Signed)
 Visit Information  Thank you for taking time to visit with me today. Please don't hesitate to contact me if I can be of assistance to you before our next scheduled telephone appointment.  Our next appointment is by telephone on 08/10/24 in the morning for quick follow up   Following is a copy of your care plan:   Goals Addressed             This Visit's Progress    VBCI Transitions of Care (TOC) Care Plan       Problems:  Prior Hospitalization for treatment of shortness of breath New hospitalization for acute on chronic CHF Home Health services barrier: patient states she just got home yesterday 08/08/24 evening and agrees to call [facility] home health if she does not hear from them by the end of day 08/10/24 - states she is known to them  and SDOH barrier:  BWS, Orlean Fey spoke with patient 08/02/24 and provided information for transportation with [facility] and patient confirms she has the application - however, patient admitted 08/03/24 and discharged 08/08/24 so she has just looked at the application and will complete it and understands Orlean has her scheduled again 08/15/24   Goal:  Over the next 30 days, the patient will not experience hospital readmission  Interventions:  Transitions of Care: Doctor Visits  - discussed the importance of doctor visits Hospital f/u scheduled with PCP 08/14/24 and with heart and vascular and vascular 08/18/24 Reviewed in detail CHF management and importance of increasing Torsemide  to 2 tablets daily in case of weight gain 2 to 3 lbs in 24 hrs or 5 lbs in 7 day and notifying PCP or cardio or TOC RN  Reviewed with patient monitoring right wrist where she reports EMS IV site is red but appears to be improving with the Ceftin  Discussed with patient that she'd reported worsening edema for the last 8 days prior to admission - address this immediately with provider if edema starts to worsen again or if shortness of breath worsens  - patient states she did call the office prior to be admitting and was told to take an extra Torsemide  - educated to closely monitor the weight, swelling, shortness of breath and notify provider at first sign of worsening and again if doesn't improve with whatever instructions she receives, even if she has to call daily to attempt to prevent re-hospitalization  Reviewed with patients page 1 of discharge summary with the medications she is to stop - patient confirmed she removed them from pill box   Heart Failure Interventions: Advised patient to weigh each morning after emptying bladder Discussed importance of daily weight and advised patient to weigh and record daily Reviewed role of diuretics in prevention of fluid overload and management of heart failure Assessed social determinant of health barriers - see above  Patient Self Care Activities:  Attend all scheduled provider appointments Call pharmacy for medication refills 3-7 days in advance of running out of medications Call provider office for new concerns or questions  Notify RN Care Manager of TOC call rescheduling needs Participate in Transition of Care Program/Attend TOC scheduled calls Take medications as prescribed   call office if I gain more than 2 pounds in one day or 5 pounds in one week use salt in moderation watch for swelling in feet, ankles and legs every day weigh myself daily Monitor right wrist IV site - patient will take a picture today and compare it tomorrow 08/10/24   Plan:  Telephone follow up appointment with care management team member scheduled for:  Quick Touch base re: right wrist IV site - ? If continues to improve with antibiotic  The patient has been provided with contact information for the care management team and has been advised to call with any health related questions or concerns.         Patient verbalizes understanding of instructions and care plan provided today and agrees to view in  MyChart. Active MyChart status and patient understanding of how to access instructions and care plan via MyChart confirmed with patient.     Telephone follow up appointment with care management team member scheduled for: 08/10/24 for quick follow up The patient has been provided with contact information for the care management team and has been advised to call with any health related questions or concerns.   Please call the care guide team at [phone] if you need to cancel or reschedule your appointment.   Please call the Suicide and Crisis Lifeline: 988 call 1-800-273-TALK (toll free, 24 hour hotline) call 911 if you are experiencing a Mental Health or Behavioral Health Crisis or need someone to talk to. Shona Prow RN, CCM [place]  VBCI-Population Health RN Care Manager [phone]

## 2024-08-09 NOTE — Progress Notes (Signed)
 Remote PPM Transmission

## 2024-08-10 ENCOUNTER — Ambulatory Visit

## 2024-08-10 ENCOUNTER — Telehealth: Payer: Self-pay

## 2024-08-10 ENCOUNTER — Telehealth: Payer: Self-pay | Admitting: Family Medicine

## 2024-08-10 NOTE — Transitions of Care (Post Inpatient/ED Visit) (Signed)
 08/10/2024 TOC RN made quick touch base to assess right wrist: spoke with patient who states her wrist is looking better and reports weight stable at 174.4lbs blood sugar is 110. Patient does not feel she needs a call the week of the holiday and agreed to call 08/23/24 in the morning  Patient ID: Tanya Hogan, female   DOB: 1950-10-02, 73 y.o.   MRN: [id number]

## 2024-08-10 NOTE — Telephone Encounter (Unsigned)
 Copied from CRM #[id number]. Topic: Clinical - Home Health Verbal Orders >> Aug 10, 2024  1:22 PM Victoria B wrote: Caller/Agency: Jacinta from [facility] Number: [phone]  Service Requested: Skilled Nursing Frequency: 1x5 /2prn Any new concerns about the patient? no

## 2024-08-11 LAB — OPHTHALMOLOGY REPORT-SCANNED

## 2024-08-11 NOTE — Telephone Encounter (Signed)
 Home health advised.  ?

## 2024-08-11 NOTE — Telephone Encounter (Signed)
 K for skilled nursing

## 2024-08-14 ENCOUNTER — Telehealth: Payer: Self-pay | Admitting: Family Medicine

## 2024-08-14 ENCOUNTER — Ambulatory Visit

## 2024-08-14 ENCOUNTER — Encounter: Payer: Self-pay | Admitting: Family Medicine

## 2024-08-14 ENCOUNTER — Ambulatory Visit (INDEPENDENT_AMBULATORY_CARE_PROVIDER_SITE_OTHER): Admitting: Family Medicine

## 2024-08-14 ENCOUNTER — Ambulatory Visit: Payer: Self-pay | Admitting: Family Medicine

## 2024-08-14 VITALS — BP 117/45 | HR 91 | Temp 98.3°F | Ht 61.0 in | Wt 179.0 lb

## 2024-08-14 DIAGNOSIS — K117 Disturbances of salivary secretion: Secondary | ICD-10-CM

## 2024-08-14 DIAGNOSIS — Z7984 Long term (current) use of oral hypoglycemic drugs: Secondary | ICD-10-CM | POA: Diagnosis not present

## 2024-08-14 DIAGNOSIS — R748 Abnormal levels of other serum enzymes: Secondary | ICD-10-CM

## 2024-08-14 DIAGNOSIS — E1169 Type 2 diabetes mellitus with other specified complication: Secondary | ICD-10-CM | POA: Diagnosis not present

## 2024-08-14 DIAGNOSIS — I5023 Acute on chronic systolic (congestive) heart failure: Secondary | ICD-10-CM

## 2024-08-14 DIAGNOSIS — R051 Acute cough: Secondary | ICD-10-CM

## 2024-08-14 DIAGNOSIS — E785 Hyperlipidemia, unspecified: Secondary | ICD-10-CM

## 2024-08-14 LAB — POC SOFIA 2 FLU + SARS ANTIGEN FIA
Influenza A, POC: NEGATIVE
Influenza B, POC: NEGATIVE
SARS Coronavirus 2 Ag: NEGATIVE

## 2024-08-14 MED ORDER — METFORMIN HCL ER 500 MG PO TB24: 500.0000 mg | ORAL_TABLET | Freq: Every day | ORAL | Status: DC

## 2024-08-14 NOTE — Telephone Encounter (Signed)
 She had eye exam performed last week please call for report.

## 2024-08-14 NOTE — Progress Notes (Signed)
 "  Established Patient Office Visit  Patient ID: Tanya Hogan, female    DOB: 1951/06/17  Age: 73 y.o. MRN: [id number] PCP: [name], MD  No chief complaint on file.   Subjective:     HPI  Discussed the use of AI scribe software for clinical note transcription with the patient, who gave verbal consent to proceed.  History of Present Illness Tanya Hogan is a 73 year old female with heart failure who presents for a hospital follow-up after a recent exacerbation.  Heart failure exacerbation and volume status - Hospitalized from December 10th to December 16th for heart failure exacerbation with volume overload and hypotension (BP 96/62 mmHg) - EMS was called at the time of exacerbation - Since discharge, no significant ankle swelling - Five-pound weight increase after lunch today, attributed to home scale - Adhering to fluid restriction of 1500 cc per day, but experiencing difficulty due to persistent thirst  Respiratory symptoms - Persistent cough with phlegm, no specific color - No fever - Chills began yesterday  Gastrointestinal symptoms - Belching and sensation of swallowing air - Stomach pain, particularly after eating ('I take two bites and my stomach starts hurting') - Lack of appetite since returning home from the hospital - Tight sensation in throat - Regular bowel movements, last today - No constipation - Episodes of diarrhea prior to recent hospitalization, resolved during hospital stay  Oral and gustatory symptoms - Dry mouth and persistent unpleasant taste  Medication adherence and side effects - Currently taking torasemide and another medication starting with 'Spironolactone  (halved dose) daily  Ophthalmologic and neurologic history - Dry macular degeneration dx recently. Had eye appt last week.  - History of stroke with residual scar tissue  Her daughter is here with her today    ROS    Objective:     BP (!) 117/45   Pulse 91    Temp 98.3 F (36.8 C) (Oral)   Ht 5' 1 (1.549 m)   Wt 179 lb (81.2 kg)   SpO2 95%   BMI 33.82 kg/m    Physical Exam Vitals and nursing note reviewed.  Constitutional:      Appearance: Normal appearance.  HENT:     Head: Normocephalic and atraumatic.     Nose: Nose normal.     Mouth/Throat:     Mouth: Mucous membranes are dry.  Eyes:     Conjunctiva/sclera: Conjunctivae normal.  Cardiovascular:     Rate and Rhythm: Normal rate and regular rhythm.  Pulmonary:     Effort: Pulmonary effort is normal.     Breath sounds: Normal breath sounds.  Skin:    General: Skin is warm and dry.  Neurological:     Mental Status: She is alert.  Psychiatric:        Mood and Affect: Mood normal.      Results for orders placed or performed in visit on 08/14/24  POC SOFIA 2 FLU + SARS ANTIGEN FIA  Result Value Ref Range   Influenza A, POC Negative Negative   Influenza B, POC Negative Negative   SARS Coronavirus 2 Ag Negative Negative      The ASCVD Risk score (Arnett DK, et al., 2019) failed to calculate for the following reasons:   Risk score cannot be calculated because patient has a medical history suggesting prior/existing ASCVD   * - Cholesterol units were assumed    Assessment & Plan:   Problem List Items Addressed This Visit  Cardiovascular and Mediastinum   Acute on chronic systolic CHF (congestive heart failure) (HCC)   Relevant Orders   CMP14+EGFR     Endocrine   Type 2 diabetes mellitus with hyperlipidemia (HCC)   Relevant Medications   metFORMIN  (GLUCOPHAGE -XR) 500 MG 24 hr tablet   Other Visit Diagnoses       Acute cough    -  Primary   Relevant Orders   DG Chest 2 View (Completed)   POC SOFIA 2 FLU + SARS ANTIGEN FIA (Completed)     Xerostomia           Assessment and Plan Assessment & Plan Chronic systolic heart failure Recent exacerbation with fluid retention and possible viral infection. - Ordered COVID and flu swabs to rule out viral  infection.  Negative today. - Continue current diuretic regimen with torasemide and spironolactone . - Monitor weight and fluid intake closely. - Encouraged use of Xylimelts for dry mouth. - No sign of volume overload on exam today. - Will get chest x-ray for further workup  Dry age-related macular degeneration Recent examination confirmed dry macular degeneration with small scar tissue.  Xerostomia Likely secondary to diuretic use. Discussed potential causes and symptom management strategies. - Recommended Betaine mouthwash and Xylimelts for dry mouth. - Encouraged swishing with water  and spitting to moisten mouth.  Will get repeat labs as recommended at ED discharge.  Return in about 4 months (around 12/13/2024) for bp/dm.    Dorothyann Byars, MD [facility] at [facility]   "

## 2024-08-14 NOTE — Progress Notes (Signed)
 Hi Tanya Hogan, great news!  The chest x-ray looks good I just wanted to make sure with everything that is going on with you recently.  Try to get some food and yeah.  Things that sound appealing and get a few bites and throughout the day.  If you need to even do an Ensure or boost that is okay as well.

## 2024-08-15 ENCOUNTER — Encounter: Payer: Self-pay | Admitting: Family Medicine

## 2024-08-15 ENCOUNTER — Telehealth

## 2024-08-15 ENCOUNTER — Other Ambulatory Visit: Payer: Self-pay | Admitting: Cardiology

## 2024-08-15 LAB — CMP14+EGFR
ALT: 36 IU/L — ABNORMAL HIGH (ref 0–32)
AST: 72 IU/L — ABNORMAL HIGH (ref 0–40)
Albumin: 3.8 g/dL (ref 3.8–4.8)
Alkaline Phosphatase: 156 IU/L — ABNORMAL HIGH (ref 49–135)
BUN/Creatinine Ratio: 14 (ref 12–28)
BUN: 12 mg/dL (ref 8–27)
Bilirubin Total: 1.4 mg/dL — ABNORMAL HIGH (ref 0.0–1.2)
CO2: 20 mmol/L (ref 20–29)
Calcium: 10.2 mg/dL (ref 8.7–10.3)
Chloride: 97 mmol/L (ref 96–106)
Creatinine, Ser: 0.85 mg/dL (ref 0.57–1.00)
Globulin, Total: 3 g/dL (ref 1.5–4.5)
Glucose: 116 mg/dL — ABNORMAL HIGH (ref 70–99)
Potassium: 4.1 mmol/L (ref 3.5–5.2)
Sodium: 132 mmol/L — ABNORMAL LOW (ref 134–144)
Total Protein: 6.8 g/dL (ref 6.0–8.5)
eGFR: 71 mL/min/1.73

## 2024-08-15 NOTE — Telephone Encounter (Signed)
Sent fax for medical records.  

## 2024-08-15 NOTE — Progress Notes (Signed)
 Hi Annisten, liver enzymes are mildly elevated.  I did look back into the hospital record from back in November.  They were a little elevated and then repeat they can actually came down.  But they are elevated again, similar to back in March.  I want to repeat these in about 10 days.

## 2024-08-15 NOTE — Patient Outreach (Signed)
 Social Drivers of Health  Community Resource and Care Coordination Visit Note   08/15/2024  Name: Tanya Hogan MRN: [id number] DOB:Jan 23, 1951  Situation: Referral received for [facility] needs assessment and assistance related to Transportation. I obtained verbal consent from Patient.  Visit completed with Patient on the phone.   Background:    BSW outreached patient to confirm if she had received the information from the [place] center for free transportation, patient stated that she has been sick with a cold the past week and has received the application but has been unable to send the packet back. BSW will follow up with patient on 08/31/24.  Assessment:   Goals Addressed   None     Recommendation:   attend all scheduled provider appointments call for transportation assistance at least one week before appointments ask for help if you don't understand your health insurance benefits [facility].  Follow Up Plan:   Telephone follow-up 1/8 at 10am  Orlean Fey, BSW [place]  [facility] Social Worker, [employer] [facility] [phone]

## 2024-08-15 NOTE — Patient Instructions (Signed)
 Visit Information  Thank you for taking time to visit with me today. Please don't hesitate to contact me if I can be of assistance to you before our next scheduled appointment.  Your next care management appointment is by telephone on 08/31/24 at 10am  Telephone follow-up 08/31/2024 at 10am  Please call the care guide team at [phone] if you need to cancel, schedule, or reschedule an appointment.   Please call the Suicide and Crisis Lifeline: 988 call the [place]  National Suicide Prevention Lifeline: 682 163 3828 or TTY: (940)531-5865 TTY 501-572-0157) to talk to a trained counselor call 1-800-273-TALK (toll free, 24 hour hotline) go to [facility] [address] [place] [phone]) call 911 if you are experiencing a Mental Health or Behavioral Health Crisis or need someone to talk to.  Orlean Fey, BSW   [facility] Social Worker, [employer] [facility] [phone]

## 2024-08-16 ENCOUNTER — Ambulatory Visit: Payer: Self-pay | Admitting: Student in an Organized Health Care Education/Training Program

## 2024-08-16 ENCOUNTER — Other Ambulatory Visit: Payer: Self-pay

## 2024-08-16 ENCOUNTER — Encounter (HOSPITAL_COMMUNITY): Payer: Self-pay | Admitting: Emergency Medicine

## 2024-08-16 ENCOUNTER — Inpatient Hospital Stay (HOSPITAL_COMMUNITY)
Admission: EM | Admit: 2024-08-16 | Discharge: 2024-08-30 | DRG: 871 | Disposition: A | Discharge status: Hospice | Attending: Internal Medicine | Admitting: Internal Medicine

## 2024-08-16 ENCOUNTER — Emergency Department (HOSPITAL_COMMUNITY)

## 2024-08-16 DIAGNOSIS — E1151 Type 2 diabetes mellitus with diabetic peripheral angiopathy without gangrene: Secondary | ICD-10-CM | POA: Diagnosis present

## 2024-08-16 DIAGNOSIS — R29705 NIHSS score 5: Secondary | ICD-10-CM | POA: Diagnosis not present

## 2024-08-16 DIAGNOSIS — E739 Lactose intolerance, unspecified: Secondary | ICD-10-CM | POA: Diagnosis present

## 2024-08-16 DIAGNOSIS — I5043 Acute on chronic combined systolic (congestive) and diastolic (congestive) heart failure: Secondary | ICD-10-CM | POA: Diagnosis present

## 2024-08-16 DIAGNOSIS — E1169 Type 2 diabetes mellitus with other specified complication: Secondary | ICD-10-CM | POA: Diagnosis present

## 2024-08-16 DIAGNOSIS — I10 Essential (primary) hypertension: Secondary | ICD-10-CM | POA: Diagnosis not present

## 2024-08-16 DIAGNOSIS — Z96611 Presence of right artificial shoulder joint: Secondary | ICD-10-CM | POA: Diagnosis present

## 2024-08-16 DIAGNOSIS — R5381 Other malaise: Secondary | ICD-10-CM | POA: Diagnosis present

## 2024-08-16 DIAGNOSIS — I3139 Other pericardial effusion (noninflammatory): Secondary | ICD-10-CM | POA: Diagnosis present

## 2024-08-16 DIAGNOSIS — E876 Hypokalemia: Secondary | ICD-10-CM | POA: Diagnosis not present

## 2024-08-16 DIAGNOSIS — K7581 Nonalcoholic steatohepatitis (NASH): Secondary | ICD-10-CM | POA: Diagnosis present

## 2024-08-16 DIAGNOSIS — Z66 Do not resuscitate: Secondary | ICD-10-CM | POA: Diagnosis present

## 2024-08-16 DIAGNOSIS — Z7984 Long term (current) use of oral hypoglycemic drugs: Secondary | ICD-10-CM | POA: Diagnosis not present

## 2024-08-16 DIAGNOSIS — E118 Type 2 diabetes mellitus with unspecified complications: Secondary | ICD-10-CM | POA: Diagnosis not present

## 2024-08-16 DIAGNOSIS — J449 Chronic obstructive pulmonary disease, unspecified: Secondary | ICD-10-CM | POA: Diagnosis present

## 2024-08-16 DIAGNOSIS — Z7983 Long term (current) use of bisphosphonates: Secondary | ICD-10-CM

## 2024-08-16 DIAGNOSIS — I5021 Acute systolic (congestive) heart failure: Secondary | ICD-10-CM | POA: Diagnosis not present

## 2024-08-16 DIAGNOSIS — Z803 Family history of malignant neoplasm of breast: Secondary | ICD-10-CM

## 2024-08-16 DIAGNOSIS — E7849 Other hyperlipidemia: Secondary | ICD-10-CM | POA: Diagnosis present

## 2024-08-16 DIAGNOSIS — F0393 Unspecified dementia, unspecified severity, with mood disturbance: Secondary | ICD-10-CM | POA: Diagnosis present

## 2024-08-16 DIAGNOSIS — I442 Atrioventricular block, complete: Secondary | ICD-10-CM | POA: Diagnosis present

## 2024-08-16 DIAGNOSIS — R21 Rash and other nonspecific skin eruption: Secondary | ICD-10-CM | POA: Diagnosis not present

## 2024-08-16 DIAGNOSIS — Z886 Allergy status to analgesic agent status: Secondary | ICD-10-CM

## 2024-08-16 DIAGNOSIS — Z880 Allergy status to penicillin: Secondary | ICD-10-CM

## 2024-08-16 DIAGNOSIS — Z7982 Long term (current) use of aspirin: Secondary | ICD-10-CM | POA: Diagnosis not present

## 2024-08-16 DIAGNOSIS — M5030 Other cervical disc degeneration, unspecified cervical region: Secondary | ICD-10-CM | POA: Diagnosis present

## 2024-08-16 DIAGNOSIS — I5023 Acute on chronic systolic (congestive) heart failure: Secondary | ICD-10-CM | POA: Diagnosis not present

## 2024-08-16 DIAGNOSIS — I110 Hypertensive heart disease with heart failure: Secondary | ICD-10-CM | POA: Diagnosis present

## 2024-08-16 DIAGNOSIS — R6520 Severe sepsis without septic shock: Secondary | ICD-10-CM | POA: Diagnosis present

## 2024-08-16 DIAGNOSIS — I081 Rheumatic disorders of both mitral and tricuspid valves: Secondary | ICD-10-CM | POA: Diagnosis present

## 2024-08-16 DIAGNOSIS — Z6832 Body mass index (BMI) 32.0-32.9, adult: Secondary | ICD-10-CM | POA: Diagnosis not present

## 2024-08-16 DIAGNOSIS — G4733 Obstructive sleep apnea (adult) (pediatric): Secondary | ICD-10-CM | POA: Diagnosis present

## 2024-08-16 DIAGNOSIS — Z452 Encounter for adjustment and management of vascular access device: Secondary | ICD-10-CM | POA: Diagnosis not present

## 2024-08-16 DIAGNOSIS — R531 Weakness: Secondary | ICD-10-CM | POA: Diagnosis not present

## 2024-08-16 DIAGNOSIS — I82C12 Acute embolism and thrombosis of left internal jugular vein: Secondary | ICD-10-CM | POA: Diagnosis present

## 2024-08-16 DIAGNOSIS — Z87891 Personal history of nicotine dependence: Secondary | ICD-10-CM | POA: Diagnosis not present

## 2024-08-16 DIAGNOSIS — B9562 Methicillin resistant Staphylococcus aureus infection as the cause of diseases classified elsewhere: Secondary | ICD-10-CM | POA: Diagnosis not present

## 2024-08-16 DIAGNOSIS — L959 Vasculitis limited to the skin, unspecified: Secondary | ICD-10-CM | POA: Diagnosis not present

## 2024-08-16 DIAGNOSIS — R748 Abnormal levels of other serum enzymes: Secondary | ICD-10-CM | POA: Diagnosis present

## 2024-08-16 DIAGNOSIS — J189 Pneumonia, unspecified organism: Secondary | ICD-10-CM | POA: Diagnosis present

## 2024-08-16 DIAGNOSIS — Z515 Encounter for palliative care: Secondary | ICD-10-CM

## 2024-08-16 DIAGNOSIS — I472 Ventricular tachycardia, unspecified: Secondary | ICD-10-CM | POA: Diagnosis present

## 2024-08-16 DIAGNOSIS — D509 Iron deficiency anemia, unspecified: Secondary | ICD-10-CM | POA: Diagnosis present

## 2024-08-16 DIAGNOSIS — E66811 Obesity, class 1: Secondary | ICD-10-CM | POA: Diagnosis present

## 2024-08-16 DIAGNOSIS — R7881 Bacteremia: Secondary | ICD-10-CM

## 2024-08-16 DIAGNOSIS — B370 Candidal stomatitis: Secondary | ICD-10-CM | POA: Diagnosis not present

## 2024-08-16 DIAGNOSIS — I509 Heart failure, unspecified: Principal | ICD-10-CM

## 2024-08-16 DIAGNOSIS — Z9011 Acquired absence of right breast and nipple: Secondary | ICD-10-CM

## 2024-08-16 DIAGNOSIS — E119 Type 2 diabetes mellitus without complications: Secondary | ICD-10-CM | POA: Diagnosis not present

## 2024-08-16 DIAGNOSIS — G9341 Metabolic encephalopathy: Secondary | ICD-10-CM | POA: Diagnosis present

## 2024-08-16 DIAGNOSIS — E1165 Type 2 diabetes mellitus with hyperglycemia: Secondary | ICD-10-CM | POA: Diagnosis not present

## 2024-08-16 DIAGNOSIS — J4489 Other specified chronic obstructive pulmonary disease: Secondary | ICD-10-CM | POA: Diagnosis not present

## 2024-08-16 DIAGNOSIS — I272 Pulmonary hypertension, unspecified: Secondary | ICD-10-CM | POA: Diagnosis present

## 2024-08-16 DIAGNOSIS — I471 Supraventricular tachycardia, unspecified: Secondary | ICD-10-CM | POA: Diagnosis present

## 2024-08-16 DIAGNOSIS — I493 Ventricular premature depolarization: Secondary | ICD-10-CM | POA: Diagnosis present

## 2024-08-16 DIAGNOSIS — D72829 Elevated white blood cell count, unspecified: Secondary | ICD-10-CM | POA: Insufficient documentation

## 2024-08-16 DIAGNOSIS — J440 Chronic obstructive pulmonary disease with acute lower respiratory infection: Secondary | ICD-10-CM | POA: Diagnosis present

## 2024-08-16 DIAGNOSIS — E785 Hyperlipidemia, unspecified: Secondary | ICD-10-CM | POA: Diagnosis not present

## 2024-08-16 DIAGNOSIS — Z7951 Long term (current) use of inhaled steroids: Secondary | ICD-10-CM

## 2024-08-16 DIAGNOSIS — Z5982 Transportation insecurity: Secondary | ICD-10-CM

## 2024-08-16 DIAGNOSIS — R0902 Hypoxemia: Secondary | ICD-10-CM | POA: Diagnosis present

## 2024-08-16 DIAGNOSIS — I428 Other cardiomyopathies: Secondary | ICD-10-CM | POA: Diagnosis not present

## 2024-08-16 DIAGNOSIS — Z95810 Presence of automatic (implantable) cardiac defibrillator: Secondary | ICD-10-CM

## 2024-08-16 DIAGNOSIS — Z8673 Personal history of transient ischemic attack (TIA), and cerebral infarction without residual deficits: Secondary | ICD-10-CM

## 2024-08-16 DIAGNOSIS — G9340 Encephalopathy, unspecified: Secondary | ICD-10-CM | POA: Diagnosis not present

## 2024-08-16 DIAGNOSIS — I4729 Other ventricular tachycardia: Secondary | ICD-10-CM | POA: Diagnosis not present

## 2024-08-16 DIAGNOSIS — Z5989 Other problems related to housing and economic circumstances: Secondary | ICD-10-CM

## 2024-08-16 DIAGNOSIS — Z881 Allergy status to other antibiotic agents status: Secondary | ICD-10-CM

## 2024-08-16 DIAGNOSIS — J439 Emphysema, unspecified: Secondary | ICD-10-CM | POA: Diagnosis not present

## 2024-08-16 DIAGNOSIS — I5033 Acute on chronic diastolic (congestive) heart failure: Secondary | ICD-10-CM

## 2024-08-16 DIAGNOSIS — M79605 Pain in left leg: Secondary | ICD-10-CM | POA: Diagnosis not present

## 2024-08-16 DIAGNOSIS — Z882 Allergy status to sulfonamides status: Secondary | ICD-10-CM

## 2024-08-16 DIAGNOSIS — Z79890 Hormone replacement therapy (postmenopausal): Secondary | ICD-10-CM

## 2024-08-16 DIAGNOSIS — I5084 End stage heart failure: Secondary | ICD-10-CM | POA: Diagnosis present

## 2024-08-16 DIAGNOSIS — A4102 Sepsis due to Methicillin resistant Staphylococcus aureus: Principal | ICD-10-CM | POA: Diagnosis present

## 2024-08-16 DIAGNOSIS — R1310 Dysphagia, unspecified: Secondary | ICD-10-CM | POA: Diagnosis present

## 2024-08-16 DIAGNOSIS — Z87440 Personal history of urinary (tract) infections: Secondary | ICD-10-CM

## 2024-08-16 DIAGNOSIS — I6389 Other cerebral infarction: Secondary | ICD-10-CM | POA: Diagnosis not present

## 2024-08-16 DIAGNOSIS — M310 Hypersensitivity angiitis: Secondary | ICD-10-CM | POA: Diagnosis present

## 2024-08-16 DIAGNOSIS — Z820 Family history of epilepsy and other diseases of the nervous system: Secondary | ICD-10-CM

## 2024-08-16 DIAGNOSIS — R0602 Shortness of breath: Secondary | ICD-10-CM | POA: Diagnosis not present

## 2024-08-16 DIAGNOSIS — A419 Sepsis, unspecified organism: Secondary | ICD-10-CM | POA: Insufficient documentation

## 2024-08-16 DIAGNOSIS — Z8614 Personal history of Methicillin resistant Staphylococcus aureus infection: Secondary | ICD-10-CM

## 2024-08-16 DIAGNOSIS — Z8249 Family history of ischemic heart disease and other diseases of the circulatory system: Secondary | ICD-10-CM

## 2024-08-16 DIAGNOSIS — Z888 Allergy status to other drugs, medicaments and biological substances status: Secondary | ICD-10-CM

## 2024-08-16 DIAGNOSIS — Z6833 Body mass index (BMI) 33.0-33.9, adult: Secondary | ICD-10-CM

## 2024-08-16 DIAGNOSIS — Z853 Personal history of malignant neoplasm of breast: Secondary | ICD-10-CM | POA: Diagnosis not present

## 2024-08-16 DIAGNOSIS — F32A Depression, unspecified: Secondary | ICD-10-CM | POA: Diagnosis present

## 2024-08-16 DIAGNOSIS — R471 Dysarthria and anarthria: Secondary | ICD-10-CM | POA: Diagnosis not present

## 2024-08-16 DIAGNOSIS — I447 Left bundle-branch block, unspecified: Secondary | ICD-10-CM | POA: Diagnosis present

## 2024-08-16 DIAGNOSIS — Z1152 Encounter for screening for COVID-19: Secondary | ICD-10-CM

## 2024-08-16 DIAGNOSIS — K219 Gastro-esophageal reflux disease without esophagitis: Secondary | ICD-10-CM | POA: Diagnosis not present

## 2024-08-16 DIAGNOSIS — D0511 Intraductal carcinoma in situ of right breast: Secondary | ICD-10-CM | POA: Diagnosis present

## 2024-08-16 DIAGNOSIS — Z79899 Other long term (current) drug therapy: Secondary | ICD-10-CM

## 2024-08-16 DIAGNOSIS — I5020 Unspecified systolic (congestive) heart failure: Secondary | ICD-10-CM | POA: Diagnosis not present

## 2024-08-16 DIAGNOSIS — Z950 Presence of cardiac pacemaker: Secondary | ICD-10-CM | POA: Diagnosis not present

## 2024-08-16 DIAGNOSIS — K581 Irritable bowel syndrome with constipation: Secondary | ICD-10-CM | POA: Diagnosis present

## 2024-08-16 DIAGNOSIS — E871 Hypo-osmolality and hyponatremia: Secondary | ICD-10-CM | POA: Diagnosis present

## 2024-08-16 DIAGNOSIS — R233 Spontaneous ecchymoses: Secondary | ICD-10-CM | POA: Diagnosis not present

## 2024-08-16 DIAGNOSIS — M79604 Pain in right leg: Secondary | ICD-10-CM | POA: Diagnosis not present

## 2024-08-16 DIAGNOSIS — R29810 Facial weakness: Secondary | ICD-10-CM | POA: Diagnosis not present

## 2024-08-16 DIAGNOSIS — K7460 Unspecified cirrhosis of liver: Secondary | ICD-10-CM | POA: Diagnosis not present

## 2024-08-16 DIAGNOSIS — Z7189 Other specified counseling: Secondary | ICD-10-CM | POA: Diagnosis not present

## 2024-08-16 DIAGNOSIS — M810 Age-related osteoporosis without current pathological fracture: Secondary | ICD-10-CM | POA: Diagnosis present

## 2024-08-16 DIAGNOSIS — Z91041 Radiographic dye allergy status: Secondary | ICD-10-CM

## 2024-08-16 DIAGNOSIS — I5082 Biventricular heart failure: Secondary | ICD-10-CM | POA: Diagnosis not present

## 2024-08-16 DIAGNOSIS — G4730 Sleep apnea, unspecified: Secondary | ICD-10-CM | POA: Diagnosis present

## 2024-08-16 DIAGNOSIS — R52 Pain, unspecified: Secondary | ICD-10-CM | POA: Diagnosis not present

## 2024-08-16 DIAGNOSIS — M797 Fibromyalgia: Secondary | ICD-10-CM | POA: Diagnosis present

## 2024-08-16 DIAGNOSIS — E039 Hypothyroidism, unspecified: Secondary | ICD-10-CM | POA: Diagnosis present

## 2024-08-16 DIAGNOSIS — M7989 Other specified soft tissue disorders: Secondary | ICD-10-CM | POA: Diagnosis not present

## 2024-08-16 DIAGNOSIS — Z808 Family history of malignant neoplasm of other organs or systems: Secondary | ICD-10-CM

## 2024-08-16 DIAGNOSIS — K580 Irritable bowel syndrome with diarrhea: Secondary | ICD-10-CM | POA: Diagnosis not present

## 2024-08-16 LAB — CBC
HCT: 39.8 % (ref 36.0–46.0)
Hemoglobin: 13.1 g/dL (ref 12.0–15.0)
MCH: 31.1 pg (ref 26.0–34.0)
MCHC: 32.9 g/dL (ref 30.0–36.0)
MCV: 94.5 fL (ref 80.0–100.0)
Platelets: 184 K/uL (ref 150–400)
RBC: 4.21 MIL/uL (ref 3.87–5.11)
RDW: 14.5 % (ref 11.5–15.5)
WBC: 16 K/uL — ABNORMAL HIGH (ref 4.0–10.5)
nRBC: 0 % (ref 0.0–0.2)

## 2024-08-16 LAB — PHOSPHORUS: Phosphorus: 2.2 mg/dL — ABNORMAL LOW (ref 2.5–4.6)

## 2024-08-16 LAB — MAGNESIUM: Magnesium: 1.8 mg/dL (ref 1.7–2.4)

## 2024-08-16 LAB — RESP PANEL BY RT-PCR (RSV, FLU A&B, COVID)  RVPGX2
Influenza A by PCR: NEGATIVE
Influenza B by PCR: NEGATIVE
Resp Syncytial Virus by PCR: NEGATIVE
SARS Coronavirus 2 by RT PCR: NEGATIVE

## 2024-08-16 LAB — BASIC METABOLIC PANEL WITH GFR
Anion gap: 16 — ABNORMAL HIGH (ref 5–15)
BUN: 22 mg/dL (ref 8–23)
CO2: 21 mmol/L — ABNORMAL LOW (ref 22–32)
Calcium: 9.5 mg/dL (ref 8.9–10.3)
Chloride: 97 mmol/L — ABNORMAL LOW (ref 98–111)
Creatinine, Ser: 1.02 mg/dL — ABNORMAL HIGH (ref 0.44–1.00)
GFR, Estimated: 57 mL/min — ABNORMAL LOW
Glucose, Bld: 114 mg/dL — ABNORMAL HIGH (ref 70–99)
Potassium: 3.7 mmol/L (ref 3.5–5.1)
Sodium: 133 mmol/L — ABNORMAL LOW (ref 135–145)

## 2024-08-16 LAB — PROCALCITONIN: Procalcitonin: 14.8 ng/mL

## 2024-08-16 LAB — PRO BRAIN NATRIURETIC PEPTIDE: Pro Brain Natriuretic Peptide: 27035 pg/mL — ABNORMAL HIGH

## 2024-08-16 LAB — TROPONIN T, HIGH SENSITIVITY: Troponin T High Sensitivity: 33 ng/L — ABNORMAL HIGH (ref 0–19)

## 2024-08-16 MED ORDER — LEVALBUTEROL HCL 1.25 MG/0.5ML IN NEBU
1.2500 mg | INHALATION_SOLUTION | Freq: Four times a day (QID) | RESPIRATORY_TRACT | Status: DC
  Administered 2024-08-16 – 2024-08-17 (×3): 1.25 mg via RESPIRATORY_TRACT
  Filled 2024-08-16 (×5): qty 0.5

## 2024-08-16 MED ORDER — PRAVASTATIN SODIUM 40 MG PO TABS
40.0000 mg | ORAL_TABLET | Freq: Every day | ORAL | Status: DC
  Administered 2024-08-16: 40 mg via ORAL
  Filled 2024-08-16: qty 1

## 2024-08-16 MED ORDER — HYDRALAZINE HCL 20 MG/ML IJ SOLN: 10.0000 mg | INTRAMUSCULAR | Status: DC | PRN

## 2024-08-16 MED ORDER — HEPARIN SODIUM (PORCINE) 5000 UNIT/ML IJ SOLN
5000.0000 [IU] | Freq: Three times a day (TID) | INTRAMUSCULAR | Status: DC
  Administered 2024-08-16 – 2024-08-25 (×26): 5000 [IU] via SUBCUTANEOUS
  Filled 2024-08-16 (×17): qty 1

## 2024-08-16 MED ORDER — TORSEMIDE 20 MG PO TABS: 40.0000 mg | ORAL_TABLET | Freq: Two times a day (BID) | ORAL | Status: DC

## 2024-08-16 MED ORDER — OXYCODONE HCL 5 MG PO TABS
5.0000 mg | ORAL_TABLET | ORAL | Status: DC | PRN
  Administered 2024-08-17 – 2024-08-23 (×4): 5 mg via ORAL
  Filled 2024-08-16 (×3): qty 1

## 2024-08-16 MED ORDER — MIDODRINE HCL 5 MG PO TABS
2.5000 mg | ORAL_TABLET | Freq: Three times a day (TID) | ORAL | Status: DC
  Administered 2024-08-16: 2.5 mg via ORAL
  Filled 2024-08-16: qty 1

## 2024-08-16 MED ORDER — ACETAMINOPHEN 650 MG RE SUPP: 650.0000 mg | Freq: Four times a day (QID) | RECTAL | Status: DC | PRN

## 2024-08-16 MED ORDER — BUMETANIDE 0.25 MG/ML IJ SOLN
1.0000 mg | Freq: Two times a day (BID) | INTRAMUSCULAR | Status: DC
  Administered 2024-08-16: 1 mg via INTRAVENOUS
  Filled 2024-08-16 (×2): qty 4

## 2024-08-16 MED ORDER — FLORANEX PO PACK
1.0000 g | PACK | Freq: Three times a day (TID) | ORAL | Status: DC
  Administered 2024-08-16 – 2024-08-29 (×23): 1 g via ORAL
  Filled 2024-08-16 (×21): qty 1

## 2024-08-16 MED ORDER — SODIUM CHLORIDE 0.9% FLUSH
3.0000 mL | Freq: Two times a day (BID) | INTRAVENOUS | Status: DC
  Administered 2024-08-16 – 2024-08-30 (×26): 3 mL via INTRAVENOUS

## 2024-08-16 MED ORDER — FLEET ENEMA RE ENEM: 1.0000 | ENEMA | Freq: Once | RECTAL | Status: DC | PRN

## 2024-08-16 MED ORDER — SODIUM CHLORIDE 0.9% FLUSH
3.0000 mL | Freq: Two times a day (BID) | INTRAVENOUS | Status: DC
  Administered 2024-08-16 – 2024-08-29 (×27): 3 mL via INTRAVENOUS

## 2024-08-16 MED ORDER — ADULT MULTIVITAMIN W/MINERALS CH
1.0000 | ORAL_TABLET | Freq: Every day | ORAL | Status: DC
  Administered 2024-08-16 – 2024-08-29 (×14): 1 via ORAL
  Filled 2024-08-16 (×8): qty 1

## 2024-08-16 MED ORDER — BUMETANIDE 0.25 MG/ML IJ SOLN: 1.0000 mg | Freq: Every day | INTRAMUSCULAR | Status: DC

## 2024-08-16 MED ORDER — IPRATROPIUM BROMIDE 0.02 % IN SOLN
0.5000 mg | Freq: Four times a day (QID) | RESPIRATORY_TRACT | Status: DC | PRN
  Administered 2024-08-16: 0.5 mg via RESPIRATORY_TRACT
  Filled 2024-08-16: qty 2.5

## 2024-08-16 MED ORDER — LEVOTHYROXINE SODIUM 112 MCG PO TABS
112.0000 ug | ORAL_TABLET | Freq: Every day | ORAL | Status: DC
  Administered 2024-08-16 – 2024-08-30 (×14): 112 ug via ORAL
  Filled 2024-08-16 (×10): qty 1

## 2024-08-16 MED ORDER — MIDODRINE HCL 5 MG PO TABS
5.0000 mg | ORAL_TABLET | Freq: Three times a day (TID) | ORAL | Status: DC
  Administered 2024-08-16 – 2024-08-17 (×3): 5 mg via ORAL
  Filled 2024-08-16 (×4): qty 1

## 2024-08-16 MED ORDER — ONDANSETRON HCL 4 MG/2ML IJ SOLN: 4.0000 mg | Freq: Four times a day (QID) | INTRAMUSCULAR | Status: DC | PRN

## 2024-08-16 MED ORDER — ACETAMINOPHEN 325 MG PO TABS
650.0000 mg | ORAL_TABLET | Freq: Once | ORAL | Status: AC
  Administered 2024-08-16: 650 mg via ORAL
  Filled 2024-08-16: qty 2

## 2024-08-16 MED ORDER — HYDROMORPHONE HCL 1 MG/ML IJ SOLN: 0.5000 mg | INTRAMUSCULAR | Status: DC | PRN

## 2024-08-16 MED ORDER — QUINIDINE SULFATE 200 MG PO TABS
200.0000 mg | ORAL_TABLET | Freq: Three times a day (TID) | ORAL | Status: DC
  Administered 2024-08-16 – 2024-08-17 (×2): 200 mg via ORAL
  Filled 2024-08-16 (×6): qty 1

## 2024-08-16 MED ORDER — ONDANSETRON HCL 4 MG PO TABS: 4.0000 mg | ORAL_TABLET | Freq: Four times a day (QID) | ORAL | Status: DC | PRN

## 2024-08-16 MED ORDER — PANTOPRAZOLE SODIUM 40 MG PO TBEC
40.0000 mg | DELAYED_RELEASE_TABLET | Freq: Every day | ORAL | Status: DC
  Administered 2024-08-17 – 2024-08-30 (×14): 40 mg via ORAL
  Filled 2024-08-16 (×8): qty 1

## 2024-08-16 MED ORDER — BISACODYL 5 MG PO TBEC: 5.0000 mg | DELAYED_RELEASE_TABLET | Freq: Every day | ORAL | Status: DC | PRN

## 2024-08-16 MED ORDER — BUMETANIDE 0.25 MG/ML IJ SOLN
1.0000 mg | Freq: Once | INTRAMUSCULAR | Status: AC
  Administered 2024-08-16: 1 mg via INTRAVENOUS
  Filled 2024-08-16: qty 4

## 2024-08-16 MED ORDER — SENNOSIDES-DOCUSATE SODIUM 8.6-50 MG PO TABS
1.0000 | ORAL_TABLET | Freq: Every evening | ORAL | Status: DC | PRN
  Administered 2024-08-28: 1 via ORAL
  Filled 2024-08-16: qty 1

## 2024-08-16 MED ORDER — ASPIRIN 81 MG PO CHEW
81.0000 mg | CHEWABLE_TABLET | Freq: Every day | ORAL | Status: DC
  Administered 2024-08-17 – 2024-08-29 (×13): 81 mg via ORAL
  Filled 2024-08-16 (×7): qty 1

## 2024-08-16 MED ORDER — TRAZODONE HCL 50 MG PO TABS
25.0000 mg | ORAL_TABLET | Freq: Every evening | ORAL | Status: DC | PRN
  Administered 2024-08-16 – 2024-08-28 (×6): 25 mg via ORAL
  Filled 2024-08-16 (×3): qty 1

## 2024-08-16 MED ORDER — ACETAMINOPHEN 325 MG PO TABS
650.0000 mg | ORAL_TABLET | Freq: Four times a day (QID) | ORAL | Status: DC | PRN
  Administered 2024-08-18 – 2024-08-29 (×8): 650 mg via ORAL
  Filled 2024-08-16 (×6): qty 2

## 2024-08-16 MED ORDER — LORATADINE 10 MG PO TABS
10.0000 mg | ORAL_TABLET | Freq: Every day | ORAL | Status: DC
  Administered 2024-08-17: 10 mg via ORAL
  Filled 2024-08-16: qty 1

## 2024-08-16 MED ORDER — AMIODARONE HCL 200 MG PO TABS
200.0000 mg | ORAL_TABLET | Freq: Every day | ORAL | Status: DC
  Administered 2024-08-16 – 2024-08-29 (×14): 200 mg via ORAL
  Filled 2024-08-16 (×8): qty 1

## 2024-08-16 MED ORDER — POTASSIUM CHLORIDE CRYS ER 20 MEQ PO TBCR
40.0000 meq | EXTENDED_RELEASE_TABLET | Freq: Once | ORAL | Status: AC
  Administered 2024-08-16: 40 meq via ORAL
  Filled 2024-08-16: qty 2

## 2024-08-16 NOTE — Assessment & Plan Note (Deleted)
 Continue statins

## 2024-08-16 NOTE — Assessment & Plan Note (Signed)
 Continue home bowel regimen Currently stable

## 2024-08-16 NOTE — Assessment & Plan Note (Deleted)
 Continue alendronate  injections

## 2024-08-16 NOTE — Assessment & Plan Note (Addendum)
 No signs of exacerbation  Continue bronchodilator therapy Continue oxymetry monitoring

## 2024-08-16 NOTE — Evaluation (Signed)
 Physical Therapy Brief Evaluation and Discharge Note Patient Details Name: Tanya Hogan MRN: [id number] DOB: Jul 16, 1951 Today's Date: 08/16/2024   History of Present Illness  Pt is 73 yo with recent hospitalization on 12/11 with swelling and difficulty breathing. Currently pt was admitted 12/24 for acute on chronic HF. EFY:mzrzwu revision of pacemaker/AICD, breast cancer, chronic heart failure, nonalcoholic cirrhosis, BiV pacemake, depression, DM2, GERD, HTN, hypothyroidism, valvular heart disease, pulmonary hypertension, sleep apnea and ventricular tachy arrhythmia.  Clinical Impression  Pt is currently presenting at Mod I for bed mobility, sit to stand and gait. No AD readily available during eval pt was able to ambulate 50 ft with light HHA; baseline pt ambulates with RW and rollator. Pt was receiving HHPT and would benefit from this continuing after discharge. Pt will benefit from continued mobility while in the hospital by mobility team and nursing staff but due to pt is at baseline level of functioning does not have needs for acute physical therapy services at this time. Pt will be discharged from acute care skilled physical therapy services due to pt is at baseline level of functioning. Please re-consult if further needs arise.       PT Assessment All further PT needs can be met in the next venue of care     Equipment Recommendations None recommended by PT     Precautions/Restrictions Precautions Precautions: Fall Recall of Precautions/Restrictions: Intact Restrictions Weight Bearing Restrictions Per Provider Order: No        Mobility  Bed Mobility Rolling: Modified independent (Device/Increase time) Supine/Sidelying to sit: Modified independent (Device/Increased time) Sit to supine/sidelying: Modified independent (Device/Increased time)    Transfers Overall transfer level: Modified independent Equipment used: None Transfers: Sit to/from Stand Sit to Stand: Modified  independent (Device/Increase time)           General transfer comment: pt able to stand without assist; stable.    Ambulation/Gait Ambulation/Gait assistance: Modified independent (Device/Increase time) Gait Distance (Feet): 50 Feet Assistive device: 1 person hand held assist Gait Pattern/deviations: Step-through pattern, Decreased stride length Gait Speed: Below normal General Gait Details: HHA due to no AD readily available. Pt with little to no support on RUE with HHA. Pt uses RW and rollator at baseline.  Home Activity Instructions Home Activity Instructions: try to fill out paperwork for MD appt transportation      Balance Overall balance assessment: Mild deficits observed, not formally tested Sitting-balance support: No upper extremity supported, Feet supported Sitting balance-[name] Scale: Good     Standing balance support: Single extremity supported, During functional activity Standing balance-[name] Scale: Fair Standing balance comment: no overt LOB, very light UE support.          Pertinent Vitals/Pain   Pain Assessment Pain Assessment: 0-10 Pain Score: 4  Pain Location: in bil feet Pain Descriptors / Indicators: Sharp Pain Intervention(s): Limited activity within patient's tolerance, Monitored during session     Home Living Family/patient expects to be discharged to:: Private residence Living Arrangements: Other relatives (lives with brother who has parkinsons) Available Help at Discharge: Family;Available PRN/intermittently (daughter can help occasionally and son helps. Daughter and son help with brother) Home Environment: Ramped entrance   Home Equipment: [employer]ant (2 wheels);Rollator (4 wheels);Shower seat;Wheelchair - manual   Additional Comments: pt is primary caregiver for brother    Prior Function Level of Independence: Independent with assistive device(s) Comments: rollator in community, RW in home    UE/LE Assessment   UE  ROM/Strength/Tone/Coordination: [facility]    LE ROM/Strength/Tone/Coordination:  WFL      Communication   Communication Communication: No apparent difficulties     Cognition Overall Cognitive Status: Appears within functional limits for tasks assessed/performed              Assessment/Plan    PT Problem List Decreased strength;Decreased range of motion;Decreased activity tolerance;Decreased balance;Decreased mobility;Decreased knowledge of use of DME;Pain       PT Visit Diagnosis Unsteadiness on feet (R26.81);Other abnormalities of gait and mobility (R26.89);Muscle weakness (generalized) (M62.81);Pain    No Skilled PT      AMPAC 6 Clicks Help needed turning from your back to your side while in a flat bed without using bedrails?: None Help needed moving from lying on your back to sitting on the side of a flat bed without using bedrails?: None Help needed moving to and from a bed to a chair (including a wheelchair)?: None Help needed standing up from a chair using your arms (e.g., wheelchair or bedside chair)?: None Help needed to walk in hospital room?: None Help needed climbing 3-5 steps with a railing? : A Little 6 Click Score: 23      End of Session Equipment Utilized During Treatment: Gait belt Activity Tolerance: Patient tolerated treatment well Patient left: in bed;with call bell/phone within reach Nurse Communication: Mobility status PT Visit Diagnosis: Unsteadiness on feet (R26.81);Other abnormalities of gait and mobility (R26.89);Muscle weakness (generalized) (M62.81);Pain Pain - Right/Left:  (bil) Pain - part of body: Ankle and joints of foot     Time: 1416-1433 PT Time Calculation (min) (ACUTE ONLY): 17 min  Charges:   PT Evaluation $PT Eval Low Complexity: 1 Low      [name], DPT, CLT  Acute Rehabilitation Services Office: [phone] (Secure chat preferred)   [name]  08/16/2024, 2:43 PM

## 2024-08-16 NOTE — Hospital Course (Addendum)
 73 year old female with history of chronic systolic CHF, HTN, PVD, DM2, presence of pacemaker, hypothyroidism comes into the hospital with shortness of breath. Of note, she was recently hospitalized mid-December for CHF exacerbation, sent home after diuresis, but developed recurrent dyspnea and lower extremity edema at home and came back to the ED. She was found to have MRSA bacteremia. ID consulted and following. Due to presence of pacemaker cardiology consulted as well. She is deemed to be a very poor candidate for pacer lead extraction given end-stage heart failure and poor prognosis. Palliative consulted as well

## 2024-08-16 NOTE — ED Provider Notes (Signed)
 " [place] EMERGENCY DEPARTMENT AT [facility] Provider Note   CSN: [id number] Arrival date & time: 08/16/24  9353     Patient presents with: Shortness of Breath   Tanya Hogan is a 73 y.o. female.  She has significant cardiac disease, pacemaker AICD, diabetes, pulmonary hypertension.  Complaining of shortness of breath and minimally productive cough over the last week.  Saw her PCP few days ago and was negative for COVID.  Checked her weights today and found they were up 5 pounds from her baseline.  She has been compliant with her diuretics.  No chest pain or fever.  She has had some yellow and white sputum.   The history is provided by the patient.  Shortness of Breath Severity:  Moderate Onset quality:  Gradual Duration:  1 week Timing:  Constant Progression:  Unchanged Chronicity:  Recurrent Relieved by:  Nothing Worsened by:  Activity and coughing Ineffective treatments:  Rest and diuretics Associated symptoms: cough and sputum production   Associated symptoms: no abdominal pain, no chest pain, no fever, no hemoptysis and no wheezing        Prior to Admission medications  Medication Sig Start Date End Date Taking? Authorizing Provider  albuterol  (PROVENTIL ) (2.5 MG/3ML) 0.083% nebulizer solution Take 2.5 mg by nebulization daily as needed for wheezing or shortness of breath.    [provider]  albuterol  (VENTOLIN  HFA) 108 (90 Base) MCG/ACT inhaler Inhale 2 puffs into the lungs every 6 (six) hours as needed for wheezing or shortness of breath. 07/24/24   [name], MD  alendronate  (FOSAMAX ) 70 MG tablet TAKE 1 TABLET BY MOUTH EVERY 7  DAYS WITH FULL GLASS OF WATER  ON AN EMPTY STOMACH 05/26/24   [name], MD  amiodarone  (PACERONE ) 200 MG tablet Take 1 tablet (200 mg total) by mouth daily. 07/03/24   [name], MD  aspirin  81 MG chewable tablet Chew 81 mg by mouth daily.    [provider]  Calcium   Carbonate-Vit D-Min (CALCIUM  600+D PLUS MINERALS) 600-400 MG-UNIT TABS 1 tab p.o. twice daily 11/02/23   Curtis Debby PARAS, MD  EPINEPHrine  0.3 mg/0.3 mL IJ SOAJ injection Inject 0.3 mg into the muscle as needed for anaphylaxis. 08/27/23   [name], MD  fexofenadine  (ALLEGRA ) 180 MG tablet Take 180 mg by mouth daily.    [provider]  fluticasone -salmeterol (WIXELA INHUB) 250-50 MCG/ACT AEPB Inhale 1 puff into the lungs in the morning and at bedtime.    [provider]  glucose blood test strip To be used to check blood glucose Dx:E11.8 FreeStyle Precision blood glucose for Freestyle Libre 2 05/02/24   [name], MD  levothyroxine  (SYNTHROID ) 112 MCG tablet TAKE 1 TABLET BY MOUTH DAILY 07/05/24   [name], MD  metFORMIN  (GLUCOPHAGE -XR) 500 MG 24 hr tablet Take 1 tablet (500 mg total) by mouth daily. Per 08/09/24 discharge states 1 tablet twice daily - patient states PCP has her on this once daily and they plan to discuss once or twice daily at 08/14/24 appt 08/14/24   [name], MD  Multiple Vitamins-Minerals (MULTIVITAMIN WOMEN 50+) TABS Take 1 tablet by mouth daily. Patient taking differently: Take 1 tablet by mouth daily. Patient states she takes Multivitamin Adult  that has iron and not the women's 50 plus    [provider]  pantoprazole  (PROTONIX ) 40 MG tablet TAKE 1 TABLET BY MOUTH DAILY  BEFORE BREAKFAST 10/20/22   Alvan Dorothyann  D, MD  pravastatin  (PRAVACHOL ) 40 MG tablet Take 40 mg by mouth at bedtime. 01/26/24   [provider]  quiNIDine sulfate  200 MG tablet Take 1 tablet (200 mg total) by mouth 3 (three) times daily. 06/07/24   Aniceto Daphne CROME, NP  SPIRIVA  RESPIMAT 2.5 MCG/ACT AERS Inhale 2 puffs into the lungs daily. 06/21/24   [provider]  spironolactone  (ALDACTONE ) 25 MG tablet Take 0.5 tablets (12.5 mg total) by mouth daily. 06/08/24 09/06/24  Pietro Redell RAMAN, MD  torsemide   (DEMADEX ) 20 MG tablet Take 1 tablet (20 mg total) by mouth daily. Take 2 tablets daily in case of weight gain 2 to 3 lbs in 24 hrs or 5 lbs in 7 days 08/08/24   Arrien, Elidia Sieving, MD  valACYclovir  (VALTREX ) 1000 MG tablet Take 1 tablet (1,000 mg total) by mouth 2 (two) times daily. Patient not taking: Reported on 08/09/2024 05/17/23   Alvia Bring, DO    Allergies: Injectafer [ferric carboxymaltose], Penicillins, Accupril [quinapril hcl], Celebrex [celecoxib], Cozaar  [losartan  potassium], Dml forte, Entresto [sacubitril-valsartan], Flonase  [fluticasone ], Lactose intolerance (gi), Lipitor [atorvastatin ], Nasonex  [mometasone  furoate], Nsaids, Advil [ibuprofen], Baking soda-fluoride [sodium fluoride], Bayer aspirin  [aspirin ], Chamomile, Clindamycin /lincomycin, Codeine, Lasix  [furosemide ], Miralax  [polyethylene glycol (macrogol)], Quinine, Reclast  [zoledronic  acid], Rifadin [rifampin], Singulair  [montelukast ], Sulfa antibiotics, Ultram  [tramadol ], Vesicare  [solifenacin ], Wellbutrin  [bupropion ], and Wool alcohol  [lanolin]    Review of Systems  Constitutional:  Negative for fever.  Respiratory:  Positive for cough, sputum production and shortness of breath. Negative for hemoptysis and wheezing.   Cardiovascular:  Negative for chest pain.  Gastrointestinal:  Negative for abdominal pain.    Updated Vital Signs BP (!) 99/59 (BP Location: Right Arm)   Pulse 88   Temp 99.1 F (37.3 C) (Oral)   Resp 17   SpO2 94%   Physical Exam Vitals and nursing note reviewed.  Constitutional:      General: She is not in acute distress.    Appearance: Normal appearance. She is well-developed.  HENT:     Head: Normocephalic and atraumatic.  Eyes:     Conjunctiva/sclera: Conjunctivae normal.  Cardiovascular:     Rate and Rhythm: Normal rate and regular rhythm.     Heart sounds: No murmur heard. Pulmonary:     Effort: Pulmonary effort is normal. No respiratory distress.     Breath sounds: Normal breath  sounds. No stridor. No wheezing.  Abdominal:     Palpations: Abdomen is soft.     Tenderness: There is no abdominal tenderness. There is no guarding or rebound.  Musculoskeletal:        General: No tenderness or deformity. Normal range of motion.     Cervical back: Neck supple.     Right lower leg: No tenderness. No edema.     Left lower leg: No tenderness. No edema.  Skin:    General: Skin is warm and dry.  Neurological:     General: No focal deficit present.     Mental Status: She is alert.     GCS: GCS eye subscore is 4. GCS verbal subscore is 5. GCS motor subscore is 6.     (all labs ordered are listed, but only abnormal results are displayed) Labs Reviewed  BASIC METABOLIC PANEL WITH GFR - Abnormal; Notable for the following components:      Result Value   Sodium 133 (*)    Chloride 97 (*)    CO2 21 (*)    Glucose, Bld 114 (*)    Creatinine, Ser  1.02 (*)    GFR, Estimated 57 (*)    Anion gap 16 (*)    All other components within normal limits  CBC - Abnormal; Notable for the following components:   WBC 16.0 (*)    All other components within normal limits  PRO BRAIN NATRIURETIC PEPTIDE - Abnormal; Notable for the following components:   Pro Brain Natriuretic Peptide 27,035.0 (*)    All other components within normal limits  TROPONIN T, HIGH SENSITIVITY - Abnormal; Notable for the following components:   Troponin T High Sensitivity 33 (*)    All other components within normal limits  RESP PANEL BY RT-PCR (RSV, FLU A&B, COVID)  RVPGX2  EXPECTORATED SPUTUM ASSESSMENT W GRAM STAIN, RFLX TO RESP C  MAGNESIUM   PHOSPHORUS  URINALYSIS, ROUTINE W REFLEX MICROSCOPIC  PROCALCITONIN  COMPREHENSIVE METABOLIC PANEL WITH GFR  CBC  PRO BRAIN NATRIURETIC PEPTIDE    EKG: EKG Interpretation Date/Time:  Wednesday August 16 2024 07:05:36 EST Ventricular Rate:  90 PR Interval:  186 QRS Duration:  152 QT Interval:  444 QTC Calculation: 543 R Axis:   151  Text  Interpretation: AV dual-paced rhythm with occasional atrial-paced complexes Abnormal ECG When compared with ECG of 03-Aug-2024 09:22, No significant change since last tracing Confirmed by Towana Sharper [phone]) on 08/16/2024 7:48:04 AM  Radiology: [facility] Chest 2 View Result Date: 08/16/2024 EXAM: 2 VIEW(S) XRAY OF THE CHEST 08/16/2024 07:25:00 AM COMPARISON: 08/14/2024 CLINICAL HISTORY: 73 year old female with shortness of breath. FINDINGS: LINES, TUBES AND DEVICES: Left chest ICD noted. LUNGS AND PLEURA: Progressed pulmonary vascularity. No edema. No confluent lung opacity. No pleural effusion. No pneumothorax. HEART AND MEDIASTINUM: Cardiomegaly. BONES AND SOFT TISSUES: Right shoulder arthroplasty noted. Multilevel degenerative changes of thoracic spine. No acute osseous abnormality. IMPRESSION: 1. No acute cardiopulmonary abnormality. Electronically signed by: Helayne Hurst MD 08/16/2024 07:30 AM EST RP Workstation: [id number]   DG Chest 2 View Result Date: 08/14/2024 CLINICAL DATA:  Cough for 1 week EXAM: CHEST - 2 VIEW COMPARISON:  08/02/2024, CT 07/13/2024, chest x-ray 06/05/2024 FINDINGS: Right shoulder replacement. Left-sided multi lead pacing device, similar compared to prior. Cardiomegaly without focal airspace disease, pleural effusion or pneumothorax. IMPRESSION: No active cardiopulmonary disease. Cardiomegaly. Electronically Signed   By: Luke Bun M.D.   On: 08/14/2024 16:59     Procedures   Medications Ordered in the ED - No data to display  Clinical Course as of 08/16/24 1010  Wed Aug 16, 2024  0812 Discussed with Dr. Court cardiology.  They said they would be a consult service and the patient could be admitted to medicine. [MB]  1009 Patient feels she is too dyspneic to go home and is agreeable to admission.  Discussed with Dr. Twana Pries hospitalist who will evaluate for admission. [MB]    Clinical Course User Index [MB] Towana Sharper BROCKS, MD                                  Medical Decision Making Amount and/or Complexity of Data Reviewed Labs: ordered. Radiology: ordered.  Risk OTC drugs. Prescription drug management. Decision regarding hospitalization.   This patient complains of shortness of breath dyspnea on exertion weight gain; this involves an extensive number of treatment Options and is a complaint that carries with it a high risk of complications and morbidity. The differential includes CHF, COPD, pneumonia ACS, infection  I ordered, reviewed and interpreted labs, which included CBC with  elevated white count chemistries fairly unremarkable, troponins mildly elevated, BNP significantly elevated I ordered medication IV Bumex  and reviewed PMP when indicated. I ordered imaging studies which included chest x-ray and I independently    visualized and interpreted imaging which showed no acute findings Previous records obtained and reviewed in epic including recent cardiology and discharge summary I consulted cardiology Dr. Court and [facility] hospitalist Dr. Willette and discussed lab and imaging findings and discussed disposition.  Cardiac monitoring reviewed, sinus rhythm Social determinants considered, no significant barriers Critical Interventions: None  After the interventions stated above, I reevaluated the patient and found patient still to be dyspneic at rest and has soft blood pressures Admission and further testing considered, she would benefit from mission for IV diuresis and close monitoring.  Cardiology to see patient.      Final diagnoses:  Acute on chronic congestive heart failure, unspecified heart failure type [facility])    ED Discharge Orders     None          Towana Ozell BROCKS, MD 08/16/24 1714  "

## 2024-08-16 NOTE — Assessment & Plan Note (Addendum)
Calculated BMI is 33.7.

## 2024-08-16 NOTE — Assessment & Plan Note (Addendum)
Stable with no signs of decompensation. 

## 2024-08-16 NOTE — Assessment & Plan Note (Addendum)
"   Continue pantoprazole         "

## 2024-08-16 NOTE — Consult Note (Addendum)
 "  Cardiology Consultation  Patient ID: [name] MRN: [id number]; DOB: 03-02-1951  Admit date: 08/16/2024 Date of Consult: 08/16/2024  PCP:  [name], MD   [facility] Providers Cardiologist:  Redell Shallow, MD  Electrophysiologist:  [name], MD    Patient Profile: Tanya Hogan is a 73 y.o. female with a hx of chronic systolic heart failure, VT/PVCs s/p upgrade to CRT-D , mitral valve regurgitation, hypertension, type 2 diabetes, hypothyroidism, pulmonary hypertension, NASH, OSA on BiPAP, fibromyalgia, IBS, and GERD  who is being seen 08/16/2024 for the evaluation of acute on chronic HFrEF at the request of Dr. Willette.  History of Present Illness: Tanya Hogan has past medical history as listed above. She presented to [facility] 08/16/2024 for shortness of breath, weight gain, cough.   Relevant workup while in the ED includes: proBNP 27,035, CBC shows leukocytosis at 16k, BMP showed mild hyponatremia at 133, creatinine 1.02 (previously 0.85). CXR showed no acute changes.   She was just recently admitted 12/10-12/16/2025 for acute on chronic HFrEF. During this admission she required IV diuresis with Bumex  at 2 mg IV BID along with a single dose of metolazone , she diuresed ~ 6 L. She was then transitioned back to torsemide  20 mg daily along with spironolactone  12.5 mg daily. GDMT was limited secondary to hypotension. Not candidate to SGLT2i due to risk of urine infections.   She was supposed to be scheduled to see our AHF team as an outpatient, the appointment was 12/26.   She reports that she has been feeling progressively worse over the last week.  Notes shortness of breath, weakness, cough.  She states she is coughing excessively.  She does not appear overtly volume overloaded.  She does report some discrepancies in her diet, her children primarily provide food for her which is typically high in salt content.  She has responded well to  Bumex  in the past, we will continue this at gentle rate due to risk of hypotension.  She does not previously tolerate GDMT due to hypotension  Past Medical History:  Diagnosis Date   Alkaline phosphatase elevation    Asthma    Breast cancer (HCC) 2011   Carpal tunnel syndrome    bilateral   Chronic HFrEF (heart failure with reduced ejection fraction) (HCC)    Cirrhosis, non-alcoholic (HCC)    Closed fracture of unspecified part of radius (alone)    DDD (degenerative disc disease), lumbar    Depression    pt denies   Diabetes mellitus without complication (HCC)    Enlarged heart    Fibromyalgia    GERD (gastroesophageal reflux disease)    History of kidney stones    HTN (hypertension)    Hypothyroidism    IBS (irritable bowel syndrome)    LBBB (left bundle branch block)    Microcytic anemia 05/14/2017   Mitral regurgitation    NICM (nonischemic cardiomyopathy) (HCC)    Pneumonia    Presence of permanent cardiac pacemaker    Pulmonary hypertension (HCC)    PVD (peripheral vascular disease)    2019   Sleep apnea    uses cpap   Tricuspid regurgitation    Ventricular tachyarrhythmia [facility])    Past Surgical History:  Procedure Laterality Date   BIV ICD GENERATOR CHANGEOUT N/A 05/31/2024   Procedure: BIV ICD GENERATOR CHANGEOUT;  Surgeon: Primus Donnice DELENA, MD;  Location: [facility] INVASIVE CV LAB;  Service: Cardiovascular;  Laterality: N/A;   BIV PACEMAKER GENERATOR CHANGEOUT  N/A 11/03/2021   Procedure: BIV PACEMAKER GENERATOR CHANGEOUT;  Surgeon: Waddell Danelle ORN, MD;  Location: [facility] INVASIVE CV LAB;  Service: Cardiovascular;  Laterality: N/A;   BREAST LUMPECTOMY Left 2011   COLONOSCOPY     FOOT SURGERY     KNEE SURGERY     LEAD INSERTION N/A 05/31/2024   Procedure: LEAD INSERTION;  Surgeon: Almetta Donnice LABOR, MD;  Location: [facility] INVASIVE CV LAB;  Service: Cardiovascular;  Laterality: N/A;   LEAD REVISION/REPAIR N/A 11/03/2021   Procedure: LEAD REVISION/REPAIR;  Surgeon: Waddell Danelle ORN, MD;  Location: [facility] INVASIVE CV LAB;  Service: Cardiovascular;  Laterality: N/A;   MASTECTOMY Right    05/2019   PACEMAKER GENERATOR CHANGE N/A 08/23/2014   Procedure: PACEMAKER GENERATOR CHANGE;  Surgeon: Danelle ORN Waddell, MD;  Location: [facility] CATH LAB;  Service: Cardiovascular;  Laterality: N/A;   PACEMAKER PLACEMENT  2009   [place] [facility]   RECTAL SURGERY  1976   REDUCTION MAMMAPLASTY Left    05/2019   RIGHT/LEFT HEART CATH AND CORONARY ANGIOGRAPHY N/A 05/18/2024   Procedure: RIGHT/LEFT HEART CATH AND CORONARY ANGIOGRAPHY;  Surgeon: Wendel Lurena POUR, MD;  Location: [facility] INVASIVE CV LAB;  Service: Cardiovascular;  Laterality: N/A;   TOTAL SHOULDER ARTHROPLASTY Right 03/10/2018   TOTAL SHOULDER ARTHROPLASTY Right 03/10/2018   Procedure: RIGHT TOTAL SHOULDER ARTHROPLASTY;  Surgeon: Dozier Soulier, MD;  Location: [facility] OR;  Service: Orthopedics;  Laterality: Right;   TUBAL LIGATION     UPPER EXTREMITY VENOGRAPHY  05/18/2024   Procedure: UPPER EXTREMITY VENOGRAPHY;  Surgeon: Wendel Lurena POUR, MD;  Location: [facility] INVASIVE CV LAB;  Service: Cardiovascular;;    Home Medications:  Prior to Admission medications  Medication Sig Start Date End Date Taking? Authorizing Provider  albuterol  (PROVENTIL ) (2.5 MG/3ML) 0.083% nebulizer solution Take 2.5 mg by nebulization daily as needed for wheezing or shortness of breath.    [provider]  albuterol  (VENTOLIN  HFA) 108 (90 Base) MCG/ACT inhaler Inhale 2 puffs into the lungs every 6 (six) hours as needed for wheezing or shortness of breath. 07/24/24   [name], MD  alendronate  (FOSAMAX ) 70 MG tablet TAKE 1 TABLET BY MOUTH EVERY 7  DAYS WITH FULL GLASS OF WATER  ON AN EMPTY STOMACH 05/26/24   [name], MD  amiodarone  (PACERONE ) 200 MG tablet Take 1 tablet (200 mg total) by mouth daily. 07/03/24   [name], MD  aspirin  81 MG chewable tablet Chew 81 mg by mouth daily.    [provider]  Calcium  Carbonate-Vit  D-Min (CALCIUM  600+D PLUS MINERALS) 600-400 MG-UNIT TABS 1 tab p.o. twice daily 11/02/23   Curtis Debby PARAS, MD  EPINEPHrine  0.3 mg/0.3 mL IJ SOAJ injection Inject 0.3 mg into the muscle as needed for anaphylaxis. 08/27/23   [name], MD  fexofenadine  (ALLEGRA ) 180 MG tablet Take 180 mg by mouth daily.    [provider]  fluticasone -salmeterol (WIXELA INHUB) 250-50 MCG/ACT AEPB Inhale 1 puff into the lungs in the morning and at bedtime.    [provider]  glucose blood test strip To be used to check blood glucose Dx:E11.8 FreeStyle Precision blood glucose for Freestyle Libre 2 05/02/24   [name], MD  levothyroxine  (SYNTHROID ) 112 MCG tablet TAKE 1 TABLET BY MOUTH DAILY 07/05/24   [name], MD  metFORMIN  (GLUCOPHAGE -XR) 500 MG 24 hr tablet Take 1 tablet (500 mg total) by mouth daily. Per 08/09/24 discharge states 1 tablet twice daily - patient states PCP  has her on this once daily and they plan to discuss once or twice daily at 08/14/24 appt 08/14/24   [name], MD  Multiple Vitamins-Minerals (MULTIVITAMIN WOMEN 50+) TABS Take 1 tablet by mouth daily. Patient taking differently: Take 1 tablet by mouth daily. Patient states she takes Multivitamin Adult  that has iron and not the women's 50 plus    [provider]  pantoprazole  (PROTONIX ) 40 MG tablet TAKE 1 TABLET BY MOUTH DAILY  BEFORE BREAKFAST 10/20/22   [name], MD  pravastatin  (PRAVACHOL ) 40 MG tablet Take 40 mg by mouth at bedtime. 01/26/24   [provider]  quiNIDine sulfate  200 MG tablet Take 1 tablet (200 mg total) by mouth 3 (three) times daily. 06/07/24   Aniceto Daphne CROME, NP  SPIRIVA  RESPIMAT 2.5 MCG/ACT AERS Inhale 2 puffs into the lungs daily. 06/21/24   [provider]  spironolactone  (ALDACTONE ) 25 MG tablet Take 0.5 tablets (12.5 mg total) by mouth daily. 06/08/24 09/06/24  Pietro Redell RAMAN, MD  torsemide  (DEMADEX ) 20 MG tablet  Take 1 tablet (20 mg total) by mouth daily. Take 2 tablets daily in case of weight gain 2 to 3 lbs in 24 hrs or 5 lbs in 7 days 08/08/24   Arrien, Elidia Sieving, MD  valACYclovir  (VALTREX ) 1000 MG tablet Take 1 tablet (1,000 mg total) by mouth 2 (two) times daily. Patient not taking: Reported on 08/09/2024 05/17/23   Alvia Bring, DO    Scheduled Meds:  amiodarone   200 mg Oral Daily   aspirin   81 mg Oral Daily   bumetanide  (BUMEX ) IV  1 mg Intravenous Daily   heparin   5,000 Units Subcutaneous Q8H   lactobacillus  1 g Oral TID WC   levalbuterol   1.25 mg Nebulization Q6H   levothyroxine   112 mcg Oral Daily   loratadine   10 mg Oral Daily   midodrine   2.5 mg Oral TID WC   Multivitamin Women 50+  1 tablet Oral Daily   [START ON 08/17/2024] pantoprazole   40 mg Oral QAC breakfast   pravastatin   40 mg Oral QHS   quiNIDine sulfate   200 mg Oral TID   sodium chloride  flush  3 mL Intravenous Q12H   sodium chloride  flush  3 mL Intravenous Q12H   torsemide   40 mg Oral BID   Continuous Infusions:  PRN Meds: acetaminophen  **OR** acetaminophen , HYDROmorphone  (DILAUDID ) injection, ipratropium, ondansetron  **OR** ondansetron  (ZOFRAN ) IV, oxyCODONE , senna-docusate, traZODone   Allergies:   Allergies[1]  Social History:   Social History   Socioeconomic History   Marital status: Widowed    Spouse name: Not on file   Number of children: 2   Years of education: 14   Highest education level: Associate degree: academic program  Occupational History   Occupation: quality insurcance in sports coach    Comment: retired  Tobacco Use   Smoking status: Former    Current packs/day: 0.00    Types: Cigarettes    Quit date: 08/24/1966    Years since quitting: 58.0   Smokeless tobacco: Never  Vaping Use   Vaping status: Never Used  Substance and Sexual Activity   Alcohol  use: No   Drug use: No   Sexual activity: Not Currently  Other Topics Concern   Not on file  Social History Narrative    Retired. Lives with and helps her brother. Likes risk manager, nutritional therapist and music. Loves animals.   Social Drivers of Health   Tobacco Use: Medium Risk (08/16/2024)  Patient History    Smoking Tobacco Use: Former    Smokeless Tobacco Use: Never    Passive Exposure: Not on file  Financial Resource Strain: Low Risk (07/07/2024)   Received from [facility]   Overall Financial Resource Strain (CARDIA)    How hard is it for you to pay for the very basics like food, housing, medical care, and heating?: Not hard at all  Food Insecurity: No Food Insecurity (08/09/2024)   Epic    Worried About Programme Researcher, Broadcasting/film/video in the Last Year: Never true    Ran Out of Food in the Last Year: Never true  Transportation Needs: Unmet Transportation Needs (08/09/2024)   Epic    Lack of Transportation (Medical): Yes    Lack of Transportation (Non-Medical): Yes  Physical Activity: Sufficiently Active (08/10/2023)   Exercise Vital Sign    Days of Exercise per Week: 3 days    Minutes of Exercise per Session: 50 min  Recent Concern: Physical Activity - Insufficiently Active (07/12/2023)   Exercise Vital Sign    Days of Exercise per Week: 1 day    Minutes of Exercise per Session: 10 min  Stress: No Stress Concern Present (06/22/2024)   Received from [facility] - Occupational Stress Questionnaire    Do you feel stress - tense, restless, nervous, or anxious, or unable to sleep at night because your mind is troubled all the time - these days?: Not at all  Social Connections: Moderately Integrated (08/03/2024)   Social Connection and Isolation Panel    Frequency of Communication with Friends and Family: More than three times a week    Frequency of Social Gatherings with Friends and Family: More than three times a week    Attends Religious Services: More than 4 times per year    Active Member of Clubs or Organizations: Yes    Attends Banker  Meetings: More than 4 times per year    Marital Status: Divorced  Intimate Partner Violence: Not At Risk (08/09/2024)   Epic    Fear of Current or Ex-Partner: No    Emotionally Abused: No    Physically Abused: No    Sexually Abused: No  Depression (PHQ2-9): Low Risk (08/14/2024)   Depression (PHQ2-9)    PHQ-2 Score: 4  Alcohol  Screen: Low Risk (08/10/2023)   Alcohol  Screen    Last Alcohol  Screening Score (AUDIT): 0  Housing: Low Risk (08/09/2024)   Epic    Unable to Pay for Housing in the Last Year: No    Number of Times Moved in the Last Year: 0    Homeless in the Last Year: No  Utilities: Not At Risk (08/09/2024)   Epic    Threatened with loss of utilities: No  Recent Concern: Utilities - At Risk (07/07/2024)   Received from [facility]    In the past 12 months has the electric, gas, oil, or water  company threatened to shut off services in your home?: Yes  Health Literacy: Adequate Health Literacy (08/10/2023)   B1300 Health Literacy    Frequency of need for help with medical instructions: Never    Family History:   Family History  Problem Relation Age of Onset   Heart disease Mother    Melanoma Mother    Parkinson's disease Mother    Heart disease Father    Heart disease Brother    Heart failure Other    Breast cancer Other  Hypertension Other     ROS:  Please see the history of present illness.  All other ROS reviewed and negative.     Physical Exam/Data: Vitals:   08/16/24 0900 08/16/24 0930 08/16/24 1048 08/16/24 1100  BP: 101/65 (!) 93/51  110/70  Pulse: 88 89    Resp: (!) 39 (!) 35    Temp:   99 F (37.2 C)   TempSrc:   Oral   SpO2: 93% (!) 89%     No intake or output data in the 24 hours ending 08/16/24 1129    08/14/2024    3:36 PM 08/09/2024    9:40 AM 08/08/2024    6:11 AM  Last 3 Weights  Weight (lbs) 179 lb 175 lb 175 lb 11.3 oz  Weight (kg) 81.194 kg 79.379 kg 79.7 kg     There is no height or weight on file to calculate  BMI.   General:   in no acute distress HEENT: normal Neck: no JVD Vascular: No carotid bruits; Distal pulses 2+ bilaterally Cardiac:  normal S1, S2; RRR; no murmur  Lungs:  clear to auscultation bilaterally, no wheezing, rhonchi or rales  Abd: soft, nontender, no hepatomegaly  Ext: no edema Musculoskeletal:  No deformities, BUE and BLE strength normal and equal Skin: warm and dry  Neuro:  no focal abnormalities noted Psych:  Normal affect   EKG:  The EKG was personally reviewed and demonstrates: AV paced rhythm  Relevant CV Studies:  Remote device check, 08/03/2024 ICD: Scheduled remote reviewed. Normal device function.  Presenting rhythm: AP/VP  32 NSVT, V>A, V-rate 125-167 bpm, duration 1-2 sec    Right/left heart cath, 05/18/2024 Normal right dominant circulation with no obstructive coronary artery disease. Venography from the left axillary demonstrated that the subclavian vein proximal to the insertion site of multiple leads is patent however a wire and catheter could not cross the this confluence. Fick cardiac output of 5.7 L/min and Fick cardiac index of 3.1 L/min/m with following hemodynamics: Right atrial pressure mean of 8 mmHg Right ventricular pressure 52/1 with an end-diastolic pressure of 10 mmHg Wedge pressure mean of 23 mmHg with V waves to 29 mmHg PA pressure of 51/27 with mean of 37 mmHg PVR of PVR of 2.5 Woods units PA pulsatility index of 3 LVEDP of 21 to 24 mmHg across the cardiac cycle    Recommendation: Medical therapy   Echocardiogram, 05/13/2024 No apical thrombus with Definity  contrast. Left ventricular ejection fraction, by estimation, is 20 to 25% . Left ventricular ejection fraction by 2D MOD biplane is 25. 1 % . The left ventricle has severely decreased function. The left ventricle demonstrates global hypokinesis. The left ventricular internal cavity size was moderately dilated. Left ventricular diastolic parameters are consistent with Grade II  diastolic dysfunction ( pseudonormalization) . Elevated left ventricular end- diastolic pressure.  Right ventricular systolic function is normal. The right ventricular size is moderately enlarged. There is severely elevated pulmonary artery systolic pressure. The estimated right ventricular systolic pressure is 64. 6 mmHg.  Left atrial size was severely dilated.  Right atrial size was severely dilated.  The mitral valve is degenerative. Mild mitral valve regurgitation. Moderate mitral annular calcification.  The tricuspid valve is abnormal. Tricuspid valve regurgitation is mild to moderate.  The aortic valve is tricuspid. Aortic valve regurgitation is not visualized. Aortic valve sclerosis/ calcification is present, without any evidence of aortic stenosis.  The inferior vena cava is dilated in size with < 50% respiratory variability, suggesting right  atrial pressure of 15 mmHg.   Comparison( s) : Changes from prior study are noted. 3/ 8/ 2024: LVEF 20- 25% , global HK.  Laboratory Data: High Sensitivity Troponin:   Recent Labs  Lab 08/02/24 1942 08/02/24 2142  TROPONINIHS 19* 21*    Recent Labs  Lab 08/16/24 0707  TRNPT 33*      Chemistry Recent Labs  Lab 08/14/24 1542 08/16/24 0707  NA 132* 133*  K 4.1 3.7  CL 97 97*  CO2 20 21*  GLUCOSE 116* 114*  BUN 12 22  CREATININE 0.85 1.02*  CALCIUM  10.2 9.5  GFRNONAA  --  57*  ANIONGAP  --  16*    Recent Labs  Lab 08/14/24 1542  PROT 6.8  ALBUMIN 3.8  AST 72*  ALT 36*  ALKPHOS 156*  BILITOT 1.4*   Lipids No results for input(s): CHOL, TRIG, HDL, LABVLDL, LDLCALC, CHOLHDL in the last 168 hours.  Hematology Recent Labs  Lab 08/16/24 0707  WBC 16.0*  RBC 4.21  HGB 13.1  HCT 39.8  MCV 94.5  MCH 31.1  MCHC 32.9  RDW 14.5  PLT 184   Thyroid  No results for input(s): TSH, FREET4 in the last 168 hours.  BNP Recent Labs  Lab 08/16/24 0707  PROBNP 27,035.0*    DDimer No results for input(s):  DDIMER in the last 168 hours.  Radiology/Studies:  DG Chest 2 View Result Date: 08/16/2024 EXAM: 2 VIEW(S) XRAY OF THE CHEST 08/16/2024 07:25:00 AM COMPARISON: 08/14/2024 CLINICAL HISTORY: 73 year old female with shortness of breath. FINDINGS: LINES, TUBES AND DEVICES: Left chest ICD noted. LUNGS AND PLEURA: Progressed pulmonary vascularity. No edema. No confluent lung opacity. No pleural effusion. No pneumothorax. HEART AND MEDIASTINUM: Cardiomegaly. BONES AND SOFT TISSUES: Right shoulder arthroplasty noted. Multilevel degenerative changes of thoracic spine. No acute osseous abnormality. IMPRESSION: 1. No acute cardiopulmonary abnormality. Electronically signed by: Helayne Hurst MD 08/16/2024 07:30 AM EST RP Workstation: [id number]   DG Chest 2 View Result Date: 08/14/2024 CLINICAL DATA:  Cough for 1 week EXAM: CHEST - 2 VIEW COMPARISON:  08/02/2024, CT 07/13/2024, chest x-ray 06/05/2024 FINDINGS: Right shoulder replacement. Left-sided multi lead pacing device, similar compared to prior. Cardiomegaly without focal airspace disease, pleural effusion or pneumothorax. IMPRESSION: No active cardiopulmonary disease. Cardiomegaly. Electronically Signed   By: Luke Bun M.D.   On: 08/14/2024 16:59   Assessment and Plan:  Acute on chronic HFrEF Nonischemic cardiomyopathy s/p Medtronic CRT-D Hypertension Home meds: spironolactone  12.5 mg daily, torsemide  20 mg daily Echocardiogram 04/2024 showed: LVEF 20 to 25%, global hypokinesis, G2 DD, normal RV systolic function, severely elevated PASP at 64.6 mmHg, severe biatrial enlargement, mild MR, moderate MAC, mild to moderate TR, dilated IVC [facility] 04/2024 showed: No obstructive coronary artery disease BNP elevated at 27,035 Estimated dry weigh 175 lbs (recent discharge weight) Presented to ER with complaints of shortness of breath, weight gain, cough BP soft, she has had issues with hypotension in the past  Recently off Toprol  and Imdur  due to hypotension   Does not tolerate SGLT2i Does not tolerate Lasix   Given Bumex  1 mg in ER Creatinine slightly up 1.02 (baseline 0.8-0.9) Start IV diuresis with Bumex  1 mg twice daily, monitor for hypotension Started on midodrine  2.5 mg TID Continue to monitor strict I's and O's, daily weights, daily BMPs Monitor and replete electrolytes Will ensure follow-up with advanced heart failure is re-scheduled appropriately  Hyperlipidemia Home meds: Pravastatin  40 mg twice daily LDL noted to be 60 in October 2025 Continue home  statin   History of monomorphic VT s/p CRT-D Home meds: amiodarone  200 mg daily, quinidine 200 mg 3 times daily Follows closely with EP as an outpatient Recent device interrogation showed normal device function, AP/VP, 32 episodes of NSVT Toprol  discontinued due to hypotension Continue home medications Will continue to follow with electrophysiology as an outpatient   Mild MR, moderate MAC Mild to moderate TR Echo 04/2024 showed: Mild mitral regurgitation, moderate MAC   Per primary OSA on BiPAP Hypothyroidism Type 2 diabetes Electrolyte disturbances Nonalcoholic liver cirrhosis  GERD Anemia  Leukocytosis    Risk Assessment/Risk Scores:      [place]  Heart Association (NYHA) Functional Class NYHA Class II     For questions or updates, please contact [facility] Please consult [email or web address] for contact info under   Signed, Waddell DELENA Donath, PA-C  08/16/2024 11:29 AM   Agree with note by Waddell Donath, PA-C  We also see this patient with nonischemic cardiomyopathy he was just in the hospital several weeks ago with heart failure.  She is intolerant to furosemide  and was treated with Bumex .  Over the last several weeks she has had increasing shortness of breath with a weight gain of 6 pounds.  She does have a history of BiV CRT placement in the past followed by EP.  She is also followed by advanced heart failure.  Her exam is not impressive for volume  overload but her BNP is elevated at 27,000.  Her renal function is stable.  Agree with admission, gentle diuresis with Bumex  depending on blood pressure and renal function.  I suspect she will be able to be discharged in the next 2 to 4 days and will arrange outpatient follow-up with heart failure.  Dorn DOROTHA Lesches, M.D., FACP, [facility], [place], [facility]  [name] [place], [place]  [address]  [phone] 08/16/2024 12:49 PM     [1]  Allergies Allergen Reactions   Injectafer [Ferric Carboxymaltose] Shortness Of Breath    Mild SOB following injectafer infusion    Penicillins Anaphylaxis and Rash   Accupril [Quinapril Hcl] Other (See Comments)    Hyperkalemia=high potassium level   Celebrex [Celecoxib] Other (See Comments)    Hx hematemesis, bleeding ulcer   Cozaar  [Losartan  Potassium] Itching   Dml Forte Rash   Entresto [Sacubitril-Valsartan] Other (See Comments)    Hyperkalemia   Flonase  [Fluticasone ] Other (See Comments)    Hx nasal ulcer   Lactose Intolerance (Gi) Diarrhea   Lipitor [Atorvastatin ] Other (See Comments)    Myalgias    Nasonex  [Mometasone  Furoate] Other (See Comments)    Epistaxis    Nsaids Other (See Comments)    Hx hematemesis, bleeding ulcer   Advil [Ibuprofen] Nausea And Vomiting    Hx hematemesis, bleeding ulcer   Baking Soda-Fluoride [Sodium Fluoride] Itching and Rash   Bayer Aspirin  [Aspirin ] Other (See Comments)    Hx hematemesis, bleeding ulcer   Chamomile Hives   Clindamycin /Lincomycin Rash   Codeine Nausea And Vomiting   Lasix  [Furosemide ] Other (See Comments)    Tinnitus  Hypotension   Miralax  [Polyethylene Glycol (Macrogol)] Other (See Comments)    Leaky gut syndrome   Quinine Rash   Reclast  [Zoledronic  Acid] Rash and Other (See Comments)    Tremors    Rifadin [Rifampin] Other (See Comments)    Unknown reaction   Singulair  [Montelukast ] Other (See Comments)    Dizziness    Sulfa Antibiotics Rash    Ultram  [Tramadol ] Anxiety and Rash  Vesicare  [Solifenacin ] Other (See Comments)    Urinary retention   Wellbutrin  [Bupropion ] Other (See Comments)    Myalgias  Skin crawling sensation   Wool Alcohol  [Lanolin] Rash   "

## 2024-08-16 NOTE — H&P (Signed)
 " History and Physical   Patient: Tanya Hogan                            PCP: [name], MD                    DOB: 1950/11/04            DOA: 08/16/2024 FMW:[id number]             DOS: 08/16/2024, 11:21 AM  [name], MD  Patient coming from:   HOME  I have personally reviewed patient's medical records, in electronic medical records, including:  [place] [facility], and care everywhere.    Chief Complaint:   Chief Complaint  Patient presents with   Shortness of Breath    History of present illness:    Tanya Hogan is a 73 year old female with extensive history of progressive HFrEF 20-25%, nonischemic cardiomyopathy, HTN, DM II, depression, nonalcoholic liver cirrhosis, GERD, HTN, hypothyroidism,LBBB, chronic microcytic anemia, PVD .SABRASABRA Presenting again with worsening shortness of breath, lower extremity edema.  Recent hospitalization discharge CHF exacerbation.  Patient was discharged on 08/08/24; on spironolactone  12.5 mg, and torsemide  20 mg p.o. daily   ED Evaluation: Blood pressure (!) 93/51, pulse 89, temperature 99.1 F (37.3 C), RR (!) 35,  SpO2 (!) 89%  LABs: WBC 16.0, sodium 133, chloride 97, CO2 91, creatinine 1.02,BNP 27, 035.0. Troponin 33 CXR :IMPRESSION:No acute cardiopulmonary abnormality. Respiratory panel negative     Patient Denies having: Fever, Chills, Cough, SOB, Chest Pain, Abd pain, N/V/D, headache, dizziness, lightheadedness,  Dysuria, Joint pain, rash, open wounds    Review of Systems: As per HPI, otherwise 10 point review of systems were negative.   ----------------------------------------------------------------------------------------------------------------------  Allergies[1]  Home MEDs:  Prior to Admission medications  Medication Sig Start Date End Date Taking? Authorizing Provider  albuterol  (PROVENTIL ) (2.5 MG/3ML) 0.083% nebulizer solution Take 2.5 mg by nebulization daily as needed for wheezing or  shortness of breath.    [provider]  albuterol  (VENTOLIN  HFA) 108 (90 Base) MCG/ACT inhaler Inhale 2 puffs into the lungs every 6 (six) hours as needed for wheezing or shortness of breath. 07/24/24   [name], MD  alendronate  (FOSAMAX ) 70 MG tablet TAKE 1 TABLET BY MOUTH EVERY 7  DAYS WITH FULL GLASS OF WATER  ON AN EMPTY STOMACH 05/26/24   [name], MD  amiodarone  (PACERONE ) 200 MG tablet Take 1 tablet (200 mg total) by mouth daily. 07/03/24   [name], MD  aspirin  81 MG chewable tablet Chew 81 mg by mouth daily.    [provider]  Calcium  Carbonate-Vit D-Min (CALCIUM  600+D PLUS MINERALS) 600-400 MG-UNIT TABS 1 tab p.o. twice daily 11/02/23   Curtis Debby PARAS, MD  EPINEPHrine  0.3 mg/0.3 mL IJ SOAJ injection Inject 0.3 mg into the muscle as needed for anaphylaxis. 08/27/23   [name], MD  fexofenadine  (ALLEGRA ) 180 MG tablet Take 180 mg by mouth daily.    [provider]  fluticasone -salmeterol (WIXELA INHUB) 250-50 MCG/ACT AEPB Inhale 1 puff into the lungs in the morning and at bedtime.    [provider]  glucose blood test strip To be used to check blood glucose Dx:E11.8 FreeStyle Precision blood glucose for Freestyle Libre 2 05/02/24   [name], MD  levothyroxine  (SYNTHROID ) 112 MCG tablet TAKE 1 TABLET BY MOUTH DAILY 07/05/24   [name], MD  metFORMIN  (GLUCOPHAGE -XR) 500 MG 24 hr tablet Take 1 tablet (500 mg total) by mouth daily. Per 08/09/24 discharge states 1 tablet twice daily - patient states PCP has her on this once daily and they plan to discuss once or twice daily at 08/14/24 appt 08/14/24   [name], MD  Multiple Vitamins-Minerals (MULTIVITAMIN WOMEN 50+) TABS Take 1 tablet by mouth daily. Patient taking differently: Take 1 tablet by mouth daily. Patient states she takes Multivitamin Adult  that has iron and not the women's 50 plus    [provider]   pantoprazole  (PROTONIX ) 40 MG tablet TAKE 1 TABLET BY MOUTH DAILY  BEFORE BREAKFAST 10/20/22   [name], MD  pravastatin  (PRAVACHOL ) 40 MG tablet Take 40 mg by mouth at bedtime. 01/26/24   [provider]  quiNIDine sulfate  200 MG tablet Take 1 tablet (200 mg total) by mouth 3 (three) times daily. 06/07/24   Aniceto Daphne CROME, NP  SPIRIVA  RESPIMAT 2.5 MCG/ACT AERS Inhale 2 puffs into the lungs daily. 06/21/24   [provider]  spironolactone  (ALDACTONE ) 25 MG tablet Take 0.5 tablets (12.5 mg total) by mouth daily. 06/08/24 09/06/24  Pietro Redell RAMAN, MD  torsemide  (DEMADEX ) 20 MG tablet Take 1 tablet (20 mg total) by mouth daily. Take 2 tablets daily in case of weight gain 2 to 3 lbs in 24 hrs or 5 lbs in 7 days 08/08/24   Arrien, Elidia Sieving, MD  valACYclovir  (VALTREX ) 1000 MG tablet Take 1 tablet (1,000 mg total) by mouth 2 (two) times daily. Patient not taking: Reported on 08/09/2024 05/17/23   Alvia Bring, DO    PRN MEDs: acetaminophen  **OR** acetaminophen , HYDROmorphone  (DILAUDID ) injection, ipratropium, ondansetron  **OR** ondansetron  (ZOFRAN ) IV, oxyCODONE , senna-docusate, traZODone   Past Medical History:  Diagnosis Date   Alkaline phosphatase elevation    Asthma    Breast cancer (HCC) 2011   Carpal tunnel syndrome    bilateral   Chronic HFrEF (heart failure with reduced ejection fraction) (HCC)    Cirrhosis, non-alcoholic (HCC)    Closed fracture of unspecified part of radius (alone)    DDD (degenerative disc disease), lumbar    Depression    pt denies   Diabetes mellitus without complication (HCC)    Enlarged heart    Fibromyalgia    GERD (gastroesophageal reflux disease)    History of kidney stones    HTN (hypertension)    Hypothyroidism    IBS (irritable bowel syndrome)    LBBB (left bundle branch block)    Microcytic anemia 05/14/2017   Mitral regurgitation    NICM (nonischemic cardiomyopathy) (HCC)    Pneumonia    Presence of  permanent cardiac pacemaker    Pulmonary hypertension (HCC)    PVD (peripheral vascular disease)    2019   Sleep apnea    uses cpap   Tricuspid regurgitation    Ventricular tachyarrhythmia [facility])     Past Surgical History:  Procedure Laterality Date   BIV ICD GENERATOR CHANGEOUT N/A 05/31/2024   Procedure: BIV ICD GENERATOR CHANGEOUT;  Surgeon: Almetta Donnice LABOR, MD;  Location: [facility] INVASIVE CV LAB;  Service: Cardiovascular;  Laterality: N/A;   BIV PACEMAKER GENERATOR CHANGEOUT N/A 11/03/2021   Procedure: BIV PACEMAKER GENERATOR CHANGEOUT;  Surgeon: Waddell Danelle ORN, MD;  Location: [facility] INVASIVE CV LAB;  Service: Cardiovascular;  Laterality: N/A;   BREAST LUMPECTOMY Left 2011   COLONOSCOPY     FOOT SURGERY     KNEE SURGERY     LEAD INSERTION  N/A 05/31/2024   Procedure: LEAD INSERTION;  Surgeon: Almetta Donnice LABOR, MD;  Location: [facility] INVASIVE CV LAB;  Service: Cardiovascular;  Laterality: N/A;   LEAD REVISION/REPAIR N/A 11/03/2021   Procedure: LEAD REVISION/REPAIR;  Surgeon: Waddell Danelle ORN, MD;  Location: [facility] INVASIVE CV LAB;  Service: Cardiovascular;  Laterality: N/A;   MASTECTOMY Right    05/2019   PACEMAKER GENERATOR CHANGE N/A 08/23/2014   Procedure: PACEMAKER GENERATOR CHANGE;  Surgeon: Danelle ORN Waddell, MD;  Location: [facility] CATH LAB;  Service: Cardiovascular;  Laterality: N/A;   PACEMAKER PLACEMENT  2009   [place] [facility]   RECTAL SURGERY  1976   REDUCTION MAMMAPLASTY Left    05/2019   RIGHT/LEFT HEART CATH AND CORONARY ANGIOGRAPHY N/A 05/18/2024   Procedure: RIGHT/LEFT HEART CATH AND CORONARY ANGIOGRAPHY;  Surgeon: Wendel Lurena POUR, MD;  Location: [facility] INVASIVE CV LAB;  Service: Cardiovascular;  Laterality: N/A;   TOTAL SHOULDER ARTHROPLASTY Right 03/10/2018   TOTAL SHOULDER ARTHROPLASTY Right 03/10/2018   Procedure: RIGHT TOTAL SHOULDER ARTHROPLASTY;  Surgeon: Dozier Soulier, MD;  Location: [facility] OR;  Service: Orthopedics;  Laterality: Right;   TUBAL LIGATION     UPPER EXTREMITY  VENOGRAPHY  05/18/2024   Procedure: UPPER EXTREMITY VENOGRAPHY;  Surgeon: Wendel Lurena POUR, MD;  Location: [facility] INVASIVE CV LAB;  Service: Cardiovascular;;     reports that she quit smoking about 58 years ago. Her smoking use included cigarettes. She has never used smokeless tobacco. She reports that she does not drink alcohol  and does not use drugs.   Family History  Problem Relation Age of Onset   Heart disease Mother    Melanoma Mother    Parkinson's disease Mother    Heart disease Father    Heart disease Brother    Heart failure Other    Breast cancer Other    Hypertension Other     Physical Exam:   Vitals:   08/16/24 0900 08/16/24 0930 08/16/24 1048 08/16/24 1100  BP: 101/65 (!) 93/51  110/70  Pulse: 88 89    Resp: (!) 39 (!) 35    Temp:   99 F (37.2 C)   TempSrc:   Oral   SpO2: 93% (!) 89%     Constitutional: NAD, calm, comfortable Eyes: PERRL, lids and conjunctivae normal ENMT: Mucous membranes are moist. Posterior pharynx clear of any exudate or lesions.Normal dentition.  Neck: normal, supple, no masses, no thyromegaly Respiratory: clear to auscultation bilaterally, no wheezing, no crackles. Normal respiratory effort. No accessory muscle use.  Cardiovascular: Regular rate and rhythm, no murmurs / rubs / gallops. No extremity edema. 2+ pedal pulses. No carotid bruits.  Abdomen: no tenderness, no masses palpated. No hepatosplenomegaly. Bowel sounds positive.  Musculoskeletal: no clubbing / cyanosis. No joint deformity upper and lower extremities. Good ROM, no contractures. Normal muscle tone.  Neurologic: CN II-XII grossly intact. Sensation intact, DTR normal. Strength 5/5 in all 4.  Psychiatric: Normal judgment and insight. Alert and oriented x 3. Normal mood.  Skin: no rashes, lesions, ulcers. No induration Decubitus/ulcers:  Wounds: per nursing documentation         Labs on admission:    I have personally reviewed following labs and imaging  studies  CBC: Recent Labs  Lab 08/16/24 0707  WBC 16.0*  HGB 13.1  HCT 39.8  MCV 94.5  PLT 184   Basic Metabolic Panel: Recent Labs  Lab 08/14/24 1542 08/16/24 0707  NA 132* 133*  K 4.1 3.7  CL 97 97*  CO2 20 21*  GLUCOSE 116* 114*  BUN 12 22  CREATININE 0.85 1.02*  CALCIUM  10.2 9.5   GFR: Estimated Creatinine Clearance: 46 mL/min (A) (by C-G formula based on SCr of 1.02 mg/dL (H)). Liver Function Tests: Recent Labs  Lab 08/14/24 1542  AST 72*  ALT 36*  ALKPHOS 156*  BILITOT 1.4*  PROT 6.8  ALBUMIN 3.8    BNP (last 3 results) Recent Labs    08/16/24 0707  PROBNP 27,035.0*    Urine analysis:    Component Value Date/Time   COLORURINE YELLOW 07/13/2024 1435   APPEARANCEUR CLOUDY (A) 07/13/2024 1435   APPEARANCEUR Clear 12/20/2023 1443   LABSPEC 1.020 07/13/2024 1435   PHURINE 5.0 07/13/2024 1435   GLUCOSEU NEGATIVE 07/13/2024 1435   HGBUR NEGATIVE 07/13/2024 1435   BILIRUBINUR NEGATIVE 07/13/2024 1435   BILIRUBINUR Negative 12/20/2023 1443   KETONESUR NEGATIVE 07/13/2024 1435   PROTEINUR 100 (A) 07/13/2024 1435   UROBILINOGEN 0.2 11/23/2023 1334   UROBILINOGEN 0.2 04/18/2010 1340   NITRITE NEGATIVE 07/13/2024 1435   LEUKOCYTESUR MODERATE (A) 07/13/2024 1435    Last A1C:  Lab Results  Component Value Date   HGBA1C 5.5 06/05/2024     Radiologic Exams on Admission:   DG Chest 2 View Result Date: 08/16/2024 EXAM: 2 VIEW(S) XRAY OF THE CHEST 08/16/2024 07:25:00 AM COMPARISON: 08/14/2024 CLINICAL HISTORY: 73 year old female with shortness of breath. FINDINGS: LINES, TUBES AND DEVICES: Left chest ICD noted. LUNGS AND PLEURA: Progressed pulmonary vascularity. No edema. No confluent lung opacity. No pleural effusion. No pneumothorax. HEART AND MEDIASTINUM: Cardiomegaly. BONES AND SOFT TISSUES: Right shoulder arthroplasty noted. Multilevel degenerative changes of thoracic spine. No acute osseous abnormality. IMPRESSION: 1. No acute cardiopulmonary  abnormality. Electronically signed by: Helayne Hurst MD 08/16/2024 07:30 AM EST RP Workstation: [id number]   DG Chest 2 View Result Date: 08/14/2024 CLINICAL DATA:  Cough for 1 week EXAM: CHEST - 2 VIEW COMPARISON:  08/02/2024, CT 07/13/2024, chest x-ray 06/05/2024 FINDINGS: Right shoulder replacement. Left-sided multi lead pacing device, similar compared to prior. Cardiomegaly without focal airspace disease, pleural effusion or pneumothorax. IMPRESSION: No active cardiopulmonary disease. Cardiomegaly. Electronically Signed   By: Luke Bun M.D.   On: 08/14/2024 16:59    EKG:   Independently reviewed.  Orders placed or performed during the hospital encounter of 08/16/24   ED EKG   ED EKG   EKG 12-Lead   *Note: Due to a large number of results and/or encounters for the requested time period, some results have not been displayed. A complete set of results can be found in Results Review.   ---------------------------------------------------------------------------------------------------------------------------------------    Assessment / Plan:   Principal Problem:   Acute on chronic congestive heart failure with left ventricular diastolic dysfunction (HCC) Active Problems:   Essential hypertension   Biventricular cardiac pacemaker in situ   Controlled diabetes mellitus type 2 with complications (HCC)   Microcytic anemia   Physical deconditioning   Irritable bowel syndrome with constipation   AV block, 3rd degree (HCC)   COPD (chronic obstructive pulmonary disease) (HCC)   SVT (supraventricular tachycardia)   Leukocytosis   History of left breast cancer   Hypothyroidism   NASH (nonalcoholic steatohepatitis)   Ductal carcinoma in situ (DCIS) of right breast   Osteoporosis   Gastroesophageal reflux disease   Obesity, Class I, BMI 30-34.9   Hyperlipidemia   DDD (degenerative disc disease), cervical   Assessment and Plan: * Acute on chronic congestive heart failure with  left ventricular diastolic dysfunction (HCC) Patient remains tachypneic,  hypotensive satting 89% room air - -Last echo reviewed, EF 20-25%, global hypokinesis, LV cavity with moderate dilatation, grade 2 diastolic dysfunction, RV function preserved,  - BNP 27,035.0 >>>  - Cr. 1.02, GFR 57  -Home medication reviewed, Aldactone  12.5 mg will hold due to hypotension -Home medication Demadex  20 mg p.o.  Will initiate IV torsemide , Metolazone  -monitoring BP closely  - Will monitor daily weight, strict I's and O's  Previous admission was not accounted for SGL 2 inhibitors due to increased risk of recurrent UTI -Cardiology consulted-appreciate further evaluation recommendation for progressive heart failure - Considering Bumex  IV  Essential hypertension Hypotensive -Withholding BP meds  Home medication include isosorbide , spironolactone , -Unfortunately due to volume overload, CHF exacerbation Will need to continue IV diuretics,  Leukocytosis WBC 16, afebrile, tachypneic, productive cough -Obtaining procalcitonin level -Was recently on antibiotics treating hand phlebitis -Shortness of breath with no significant wheezing or COPD exacerbation Will be holding further antibiotics or now - Pending UA-will follow-up according  SVT (supraventricular tachycardia) Continue to monitor electrolytes including magnesium  and potassium Maintaining Magnesium >2.0,  Potassium >4.0 - Continue home dose of Amiodarone , and Quinidine   COPD (chronic obstructive pulmonary disease) (HCC) Hypoxic on room air 89%, will maintain O2 sat 88-92% -Continue as needed inhalers, nebs -Supplemental oxygen   AV block, 3rd degree (HCC) EKG reviewed, monitor Qtc -Avoiding beta-blocker -Prescient cardiology input  Irritable bowel syndrome with constipation Continue home bowel regimen Currently stable  Physical deconditioning Consulted PT OT for evaluation recommendations Fall precautions  Microcytic  anemia With history of iron deficiency Monitor H&H, stable  Controlled diabetes mellitus type 2 with complications (HCC) Diabetes mellitus-well-controlled, with A1c of 5.5 Holding home medication of metformin ,  - checking CBG q. ACHS, SSI coverage  Biventricular cardiac pacemaker in situ Pacemaker in place, no signs of discharge, stable  Hyperlipidemia Continue statins  Obesity, Class I, BMI 30-34.9 Last calculated BMI 33.1 Monitoring weight, recommending outpatient follow-up for weight loss program   Gastroesophageal reflux disease Continue PPI  Osteoporosis Continue alendronate  injections  Ductal carcinoma in situ (DCIS) of right breast In remission, follow-up as an outpatient  NASH (nonalcoholic steatohepatitis) Chronic, monitoring LFTs, currently stable  Hypothyroidism Continue home dose Synthroid   History of left breast cancer In remission follow-up as an outpatient  DDD (degenerative disc disease), cervical Continue as needed analgesics        Consults called: Cardiology -------------------------------------------------------------------------------------------------------------------------------------------- DVT prophylaxis:  heparin  injection 5,000 Units Start: 08/16/24 1400 SCDs Start: 08/16/24 1020   Code Status:   Code Status: Full Code   Admission status: Patient will be admitted as Inpatient, with a greater than 2 midnight length of stay. Level of care: Telemetry   Family Communication:  none at bedside  (The above findings and plan of care has been discussed with patient in detail, the patient expressed understanding and agreement of above plan)  --------------------------------------------------------------------------------------------------------------------------------------------------  Disposition Plan:  Anticipated 1-2 days Status is: Inpatient Remains inpatient appropriate because: Needing aggressive diuresis, cardiology  consultation evaluation,  -------------------------------------------------------------------------------------------------------------------------------------  Time spent:  4  Min.  Was spent seeing and evaluating the patient, reviewing all medical records, drawn plan of care.  SIGNED: Adriana DELENA Grams, MD, FHM. FAAFP. [place] [facility], Pager  (Please use [email or web address] to page/ or secure chat through epic) If 7PM-7AM, please contact night-coverage [email or web address],  08/16/2024, 11:21 AM     [1]  Allergies Allergen Reactions   Injectafer [Ferric Carboxymaltose] Shortness Of Breath    Mild SOB following injectafer infusion    Penicillins  Anaphylaxis and Rash   Accupril [Quinapril Hcl] Other (See Comments)    Hyperkalemia=high potassium level   Celebrex [Celecoxib] Other (See Comments)    Hx hematemesis, bleeding ulcer   Cozaar  [Losartan  Potassium] Itching   Dml Forte Rash   Entresto [Sacubitril-Valsartan] Other (See Comments)    Hyperkalemia   Flonase  [Fluticasone ] Other (See Comments)    Hx nasal ulcer   Lactose Intolerance (Gi) Diarrhea   Lipitor [Atorvastatin ] Other (See Comments)    Myalgias    Nasonex  [Mometasone  Furoate] Other (See Comments)    Epistaxis    Nsaids Other (See Comments)    Hx hematemesis, bleeding ulcer   Advil [Ibuprofen] Nausea And Vomiting    Hx hematemesis, bleeding ulcer   Baking Soda-Fluoride [Sodium Fluoride] Itching and Rash   Bayer Aspirin  [Aspirin ] Other (See Comments)    Hx hematemesis, bleeding ulcer   Chamomile Hives   Clindamycin /Lincomycin Rash   Codeine Nausea And Vomiting   Lasix  [Furosemide ] Other (See Comments)    Tinnitus  Hypotension   Miralax  [Polyethylene Glycol (Macrogol)] Other (See Comments)    Leaky gut syndrome   Quinine Rash   Reclast  [Zoledronic  Acid] Rash and Other (See Comments)    Tremors    Rifadin [Rifampin] Other (See Comments)    Unknown reaction   Singulair  [Montelukast ] Other (See Comments)     Dizziness    Sulfa Antibiotics Rash   Ultram  [Tramadol ] Anxiety and Rash   Vesicare  [Solifenacin ] Other (See Comments)    Urinary retention   Wellbutrin  [Bupropion ] Other (See Comments)    Myalgias  Skin crawling sensation   Wool Alcohol  [Lanolin] Rash   "

## 2024-08-16 NOTE — Assessment & Plan Note (Deleted)
 Continue to monitor electrolytes including magnesium  and potassium Maintaining Magnesium >2.0,  Potassium >4.0 - Continue home dose of Amiodarone , and Quinidine

## 2024-08-16 NOTE — TOC CM/SW Note (Signed)
 TOC consult received for d/c planning needs. Per PT note, patient is at baseline and no additional PT is recommended. No additional DME recommendations. Outpt SW assisting patient with transportation needs. Follow-up to be completed with patient as appropriate.   Merilee Batty, MSN, RN Case Management [phone]

## 2024-08-16 NOTE — Assessment & Plan Note (Addendum)
 Positive iron deficiency anemia Cell count has been stable

## 2024-08-16 NOTE — Assessment & Plan Note (Deleted)
 WBC 16, afebrile, tachypneic, productive cough -Obtaining procalcitonin level -Was recently on antibiotics treating hand phlebitis -Shortness of breath with no significant wheezing or COPD exacerbation Will be holding further antibiotics or now - Pending UA-will follow-up according

## 2024-08-16 NOTE — Assessment & Plan Note (Deleted)
 Pacemaker in place, no signs of discharge, stable

## 2024-08-16 NOTE — Assessment & Plan Note (Deleted)
 Patient remains tachypneic, hypotensive satting 89% room air - -Last echo reviewed, EF 20-25%, global hypokinesis, LV cavity with moderate dilatation, grade 2 diastolic dysfunction, RV function preserved,  - BNP 27,035.0 >>>  - Cr. 1.02, GFR 57  -Home medication reviewed, Aldactone  12.5 mg will hold due to hypotension -Home medication Demadex  20 mg p.o.  Will initiate IV torsemide , Metolazone  -monitoring BP closely  - Will monitor daily weight, strict I's and O's  Previous admission was not accounted for SGL 2 inhibitors due to increased risk of recurrent UTI -Cardiology consulted-appreciate further evaluation recommendation for progressive heart failure - Considering Bumex  IV

## 2024-08-16 NOTE — Progress Notes (Signed)
 Rx for BIPAP 15/11 sent

## 2024-08-16 NOTE — Assessment & Plan Note (Deleted)
-   In remission, follow-up as an outpatient ?

## 2024-08-16 NOTE — Evaluation (Signed)
 Occupational Therapy Evaluation Patient Details Name: Tanya Hogan MRN: [id number] DOB: 1951-06-16 Today's Date: 08/16/2024   History of Present Illness   Pt is 73 yo with recent hospitalization on 12/11 with swelling and difficulty breathing. Currently pt was admitted 12/24 for acute on chronic HF. EFY:[id number] revision of pacemaker/AICD, breast cancer, chronic heart failure, nonalcoholic cirrhosis, BiV pacemake, depression, DM2, GERD, HTN, hypothyroidism, valvular heart disease, pulmonary hypertension, sleep apnea and ventricular tachy arrhythmia.     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 Pt previously independent within the home; daughter grocery shops on her behalf, and pt receives rides for appts via insurance. Upon eval, pt reports she wears BiPAP when supine, but does not wear supplemental O2. Pt supine on RA on entry with SpO2 92%. Pt mod I for BADL and functional mobility with RW with VSS on RA. Recommend mobility specialist follow acutely to optimize activity tolerance and prevent deconditioning, however, no further acute OT needs identified as pt at her functional baseline.      If plan is discharge home, recommend the following:   Other (comment) (on request)     Functional Status Assessment   Patient has had a recent decline in their functional status and demonstrates the ability to make significant improvements in function in a reasonable and predictable amount of time.     Equipment Recommendations   None recommended by OT     Recommendations for Other Services         Precautions/Restrictions   Precautions Precautions: Fall Recall of Precautions/Restrictions: Intact Restrictions Weight Bearing Restrictions Per Provider Order: No     Mobility Bed Mobility Overal bed mobility: Modified Independent                  Transfers Overall transfer level: Modified independent Equipment used: Rolling  [place] (2 wheels)               General transfer comment: pt able to stand without assist; stable.      Balance Overall balance assessment: Mild deficits observed, not formally tested                                         ADL either performed or assessed with clinical judgement   ADL Overall ADL's : Modified independent                                             Vision         Perception         Praxis         Pertinent Vitals/Pain Pain Assessment Pain Assessment: Faces Faces Pain Scale: No hurt     Extremity/Trunk Assessment Upper Extremity Assessment Upper Extremity Assessment: Overall WFL for tasks assessed   Lower Extremity Assessment Lower Extremity Assessment: Defer to PT evaluation   Cervical / Trunk Assessment Cervical / Trunk Assessment: Kyphotic   Communication Communication Communication: No apparent difficulties   Cognition Arousal: Alert Behavior During Therapy: WFL for tasks assessed/performed Cognition: No apparent impairments                               Following commands: Intact  Cueing  General Comments   Cueing Techniques: Verbal cues  VSS on RA; BP mildly soft but RN aware105/85   Exercises     Shoulder Instructions      Home Living Family/patient expects to be discharged to:: Private residence Living Arrangements: Other relatives (lives with brother who hsa parkinson's) Available Help at Discharge: Other (Comment);Family Type of Home: House Home Access: Ramped entrance     Home Layout: One level     Bathroom Shower/Tub: Tub/shower unit;Other (comment) (sponge bathes at baseline)   Bathroom Toilet: Handicapped height Bathroom Accessibility: Yes   Home Equipment: Agricultural Consultant (2 wheels);Rollator (4 wheels);Shower seat;Wheelchair - manual   Additional Comments: pt is primary caregiver for brother      Prior Functioning/Environment Prior Level of  Function : Independent/Modified Independent             Mobility Comments: uses RW when mobilizing short distances and rollator when standing for prolonged periods and mobilizing for prolonged periods in the community ADLs Comments: receives help for rides to appointments from insurance. daughter does her grocery shopping    OT Problem List: Decreased activity tolerance   OT Treatment/Interventions:        OT Goals(Current goals can be found in the care plan section)   Acute Rehab OT Goals Patient Stated Goal: stop getting sick OT Goal Formulation: With patient   OT Frequency:       Co-evaluation              AM-PAC OT 6 Clicks Daily Activity     Outcome Measure Help from another person eating meals?: None Help from another person taking care of personal grooming?: None Help from another person toileting, which includes using toliet, bedpan, or urinal?: None Help from another person bathing (including washing, rinsing, drying)?: None Help from another person to put on and taking off regular upper body clothing?: None Help from another person to put on and taking off regular lower body clothing?: None 6 Click Score: 24   End of Session Equipment Utilized During Treatment: Gait belt;Rolling [place] (2 wheels) Nurse Communication: Mobility status  Activity Tolerance: Patient tolerated treatment well Patient left: in bed;with call bell/phone within reach  OT Visit Diagnosis: Other (comment) (decreased activity tolerance)                Time: 8496-8475 OT Time Calculation (min): 21 min Charges:  OT General Charges $OT Visit: 1 Visit OT Evaluation $OT Eval Low Complexity: 1 Low  Elma JONETTA Lebron FREDERICK, OTR/L [facility] Acute Rehabilitation Office: [phone]   Elma JONETTA Lebron 08/16/2024, 4:10 PM

## 2024-08-16 NOTE — Assessment & Plan Note (Addendum)
 Refractory VT, end stage heart failure  On amiodarone  and quinidine.  SP CRT D

## 2024-08-16 NOTE — Assessment & Plan Note (Deleted)
 Diabetes mellitus-well-controlled, with A1c of 5.5 Holding home medication of metformin ,  - checking CBG q. ACHS, SSI coverage

## 2024-08-16 NOTE — Assessment & Plan Note (Addendum)
 Continue with levothyroxine

## 2024-08-16 NOTE — Assessment & Plan Note (Deleted)
-  Continue as needed analgesics. 

## 2024-08-16 NOTE — ED Triage Notes (Signed)
 Pt arrive POV from home for c/o increase SOB and fluid retention. Pt denies any pain at this time.

## 2024-08-16 NOTE — ED Notes (Signed)
 PT at bedside.

## 2024-08-16 NOTE — Assessment & Plan Note (Addendum)
 Continue to hold on antihypertensive agents due to risk of hypotension Continue blood pressure monitoring

## 2024-08-16 NOTE — Assessment & Plan Note (Deleted)
 Consulted PT OT for evaluation recommendations Fall precautions

## 2024-08-16 NOTE — Progress Notes (Signed)
 Heart Failure Navigator Progress Note  Assessed for Heart & Vascular TOC clinic readiness.  Patient does not meet criteria due to she is an Advanced Heart Failure Team patient. .   Navigator will sign off at this time.   Stephane Haddock, BSN, Scientist, Clinical (histocompatibility And Immunogenetics) Only

## 2024-08-16 NOTE — Assessment & Plan Note (Signed)
 In remission follow-up as an outpatient

## 2024-08-17 ENCOUNTER — Inpatient Hospital Stay (HOSPITAL_COMMUNITY)

## 2024-08-17 DIAGNOSIS — I493 Ventricular premature depolarization: Secondary | ICD-10-CM

## 2024-08-17 DIAGNOSIS — I442 Atrioventricular block, complete: Secondary | ICD-10-CM | POA: Diagnosis not present

## 2024-08-17 DIAGNOSIS — E871 Hypo-osmolality and hyponatremia: Secondary | ICD-10-CM | POA: Diagnosis not present

## 2024-08-17 DIAGNOSIS — I5021 Acute systolic (congestive) heart failure: Secondary | ICD-10-CM

## 2024-08-17 DIAGNOSIS — R748 Abnormal levels of other serum enzymes: Secondary | ICD-10-CM | POA: Diagnosis not present

## 2024-08-17 DIAGNOSIS — I5023 Acute on chronic systolic (congestive) heart failure: Secondary | ICD-10-CM | POA: Diagnosis not present

## 2024-08-17 DIAGNOSIS — I4729 Other ventricular tachycardia: Secondary | ICD-10-CM | POA: Diagnosis not present

## 2024-08-17 DIAGNOSIS — R0602 Shortness of breath: Secondary | ICD-10-CM

## 2024-08-17 DIAGNOSIS — A419 Sepsis, unspecified organism: Secondary | ICD-10-CM | POA: Diagnosis not present

## 2024-08-17 DIAGNOSIS — Z950 Presence of cardiac pacemaker: Secondary | ICD-10-CM | POA: Diagnosis not present

## 2024-08-17 DIAGNOSIS — R5381 Other malaise: Secondary | ICD-10-CM

## 2024-08-17 DIAGNOSIS — E118 Type 2 diabetes mellitus with unspecified complications: Secondary | ICD-10-CM

## 2024-08-17 DIAGNOSIS — I509 Heart failure, unspecified: Secondary | ICD-10-CM | POA: Diagnosis not present

## 2024-08-17 DIAGNOSIS — R7881 Bacteremia: Secondary | ICD-10-CM | POA: Diagnosis not present

## 2024-08-17 LAB — CBC
HCT: 40.2 % (ref 36.0–46.0)
HCT: 38.6 % (ref 36.0–46.0)
Hemoglobin: 13.2 g/dL (ref 12.0–15.0)
Hemoglobin: 12.7 g/dL (ref 12.0–15.0)
MCH: 30.6 pg (ref 26.0–34.0)
MCH: 30.8 pg (ref 26.0–34.0)
MCHC: 32.8 g/dL (ref 30.0–36.0)
MCHC: 32.9 g/dL (ref 30.0–36.0)
MCV: 93.3 fL (ref 80.0–100.0)
MCV: 93.7 fL (ref 80.0–100.0)
Platelets: 170 K/uL (ref 150–400)
Platelets: 165 K/uL (ref 150–400)
RBC: 4.31 MIL/uL (ref 3.87–5.11)
RBC: 4.12 MIL/uL (ref 3.87–5.11)
RDW: 14.5 % (ref 11.5–15.5)
RDW: 14.5 % (ref 11.5–15.5)
WBC: 14.3 K/uL — ABNORMAL HIGH (ref 4.0–10.5)
WBC: 14.7 K/uL — ABNORMAL HIGH (ref 4.0–10.5)
nRBC: 0 % (ref 0.0–0.2)
nRBC: 0 % (ref 0.0–0.2)

## 2024-08-17 LAB — BASIC METABOLIC PANEL WITH GFR
Anion gap: 13 (ref 5–15)
Anion gap: 11 (ref 5–15)
BUN: 19 mg/dL (ref 8–23)
BUN: 18 mg/dL (ref 8–23)
CO2: 25 mmol/L (ref 22–32)
CO2: 26 mmol/L (ref 22–32)
Calcium: 9.1 mg/dL (ref 8.9–10.3)
Calcium: 8.8 mg/dL — ABNORMAL LOW (ref 8.9–10.3)
Chloride: 95 mmol/L — ABNORMAL LOW (ref 98–111)
Chloride: 94 mmol/L — ABNORMAL LOW (ref 98–111)
Creatinine, Ser: 0.93 mg/dL (ref 0.44–1.00)
Creatinine, Ser: 0.83 mg/dL (ref 0.44–1.00)
GFR, Estimated: >60 mL/min
GFR, Estimated: >60 mL/min
Glucose, Bld: 124 mg/dL — ABNORMAL HIGH (ref 70–99)
Glucose, Bld: 115 mg/dL — ABNORMAL HIGH (ref 70–99)
Potassium: 3.6 mmol/L (ref 3.5–5.1)
Potassium: 3.6 mmol/L (ref 3.5–5.1)
Sodium: 133 mmol/L — ABNORMAL LOW (ref 135–145)
Sodium: 132 mmol/L — ABNORMAL LOW (ref 135–145)

## 2024-08-17 LAB — ECHOCARDIOGRAM COMPLETE
Area-P 1/2: 6.17 cm2
Calc EF: 13.5 %
Est EF: <20
Height: 61 in
Single Plane A2C EF: 13.2 %
Single Plane A4C EF: 8.9 %
Weight: 2760 [oz_av]

## 2024-08-17 LAB — COMPREHENSIVE METABOLIC PANEL WITH GFR
ALT: 57 U/L — ABNORMAL HIGH (ref 0–44)
AST: 98 U/L — ABNORMAL HIGH (ref 15–41)
Albumin: 3.3 g/dL — ABNORMAL LOW (ref 3.5–5.0)
Alkaline Phosphatase: 185 U/L — ABNORMAL HIGH (ref 38–126)
Anion gap: 14 (ref 5–15)
BUN: 22 mg/dL (ref 8–23)
CO2: 22 mmol/L (ref 22–32)
Calcium: 9.4 mg/dL (ref 8.9–10.3)
Chloride: 94 mmol/L — ABNORMAL LOW (ref 98–111)
Creatinine, Ser: 0.9 mg/dL (ref 0.44–1.00)
GFR, Estimated: >60 mL/min
Glucose, Bld: 164 mg/dL — ABNORMAL HIGH (ref 70–99)
Potassium: 3.8 mmol/L (ref 3.5–5.1)
Sodium: 130 mmol/L — ABNORMAL LOW (ref 135–145)
Total Bilirubin: 1.2 mg/dL (ref 0.0–1.2)
Total Protein: 6.7 g/dL (ref 6.5–8.1)

## 2024-08-17 LAB — PROTIME-INR
INR: 1.1 (ref 0.8–1.2)
Prothrombin Time: 15.3 s — ABNORMAL HIGH (ref 11.4–15.2)

## 2024-08-17 LAB — HEPATIC FUNCTION PANEL
ALT: 57 U/L — ABNORMAL HIGH (ref 0–44)
AST: 87 U/L — ABNORMAL HIGH (ref 15–41)
Albumin: 3.3 g/dL — ABNORMAL LOW (ref 3.5–5.0)
Alkaline Phosphatase: 201 U/L — ABNORMAL HIGH (ref 38–126)
Bilirubin, Direct: 0.6 mg/dL — ABNORMAL HIGH (ref 0.0–0.2)
Indirect Bilirubin: 0.5 mg/dL (ref 0.3–0.9)
Total Bilirubin: 1.1 mg/dL (ref 0.0–1.2)
Total Protein: 6.8 g/dL (ref 6.5–8.1)

## 2024-08-17 LAB — GLUCOSE, CAPILLARY
Glucose-Capillary: 208 mg/dL — ABNORMAL HIGH (ref 70–99)
Glucose-Capillary: 185 mg/dL — ABNORMAL HIGH (ref 70–99)
Glucose-Capillary: 114 mg/dL — ABNORMAL HIGH (ref 70–99)
Glucose-Capillary: 113 mg/dL — ABNORMAL HIGH (ref 70–99)

## 2024-08-17 LAB — CG4 I-STAT (LACTIC ACID): Lactic Acid, Venous: 1.2 mmol/L (ref 0.5–1.9)

## 2024-08-17 LAB — COOXEMETRY PANEL
Carboxyhemoglobin: 1.4 % (ref 0.5–1.5)
Methemoglobin: <0.7 % (ref 0.0–1.5)
O2 Saturation: 62.1 %
Total hemoglobin: 13.4 g/dL (ref 12.0–16.0)

## 2024-08-17 LAB — MAGNESIUM
Magnesium: 1.9 mg/dL (ref 1.7–2.4)
Magnesium: 2.8 mg/dL — ABNORMAL HIGH (ref 1.7–2.4)

## 2024-08-17 LAB — HEMOGLOBIN A1C
Hgb A1c MFr Bld: 5.9 % — ABNORMAL HIGH (ref 4.8–5.6)
Mean Plasma Glucose: 122.63 mg/dL

## 2024-08-17 LAB — AMMONIA: Ammonia: 22 umol/L (ref 9–35)

## 2024-08-17 LAB — LACTIC ACID, PLASMA: Lactic Acid, Venous: 2.3 mmol/L (ref 0.5–1.9)

## 2024-08-17 LAB — APTT: aPTT: 29 s (ref 24–36)

## 2024-08-17 LAB — MRSA NEXT GEN BY PCR, NASAL: MRSA by PCR Next Gen: DETECTED — AB

## 2024-08-17 LAB — PRO BRAIN NATRIURETIC PEPTIDE: Pro Brain Natriuretic Peptide: 22372 pg/mL — ABNORMAL HIGH

## 2024-08-17 MED ORDER — INSULIN ASPART 100 UNIT/ML IJ SOLN
0.0000 [IU] | Freq: Three times a day (TID) | INTRAMUSCULAR | Status: DC
  Administered 2024-08-17: 2 [IU] via SUBCUTANEOUS
  Administered 2024-08-18 – 2024-08-19 (×3): 1 [IU] via SUBCUTANEOUS
  Administered 2024-08-20: 2 [IU] via SUBCUTANEOUS
  Administered 2024-08-22 – 2024-08-25 (×5): 1 [IU] via SUBCUTANEOUS
  Administered 2024-08-25 – 2024-08-26 (×2): 2 [IU] via SUBCUTANEOUS
  Administered 2024-08-27 – 2024-08-28 (×3): 1 [IU] via SUBCUTANEOUS
  Filled 2024-08-17: qty 2
  Filled 2024-08-17 (×4): qty 1

## 2024-08-17 MED ORDER — VANCOMYCIN HCL IN DEXTROSE 1-5 GM/200ML-% IV SOLN
1000.0000 mg | INTRAVENOUS | Status: DC
  Administered 2024-08-18 – 2024-08-21 (×4): 1000 mg via INTRAVENOUS
  Filled 2024-08-17 (×4): qty 200

## 2024-08-17 MED ORDER — QUINIDINE SULFATE 200 MG PO TABS
200.0000 mg | ORAL_TABLET | Freq: Three times a day (TID) | ORAL | Status: DC
  Administered 2024-08-18 – 2024-08-29 (×34): 200 mg via ORAL
  Filled 2024-08-17 (×18): qty 1

## 2024-08-17 MED ORDER — ORAL CARE MOUTH RINSE
15.0000 mL | OROMUCOSAL | Status: DC
  Administered 2024-08-17 – 2024-08-30 (×44): 15 mL via OROMUCOSAL

## 2024-08-17 MED ORDER — MAGNESIUM SULFATE 2 GM/50ML IV SOLN
2.0000 g | Freq: Once | INTRAVENOUS | Status: AC
  Administered 2024-08-17: 2 g via INTRAVENOUS
  Filled 2024-08-17: qty 50

## 2024-08-17 MED ORDER — PRAVASTATIN SODIUM 40 MG PO TABS
40.0000 mg | ORAL_TABLET | Freq: Every day | ORAL | Status: DC
  Administered 2024-08-18 – 2024-08-24 (×7): 40 mg via ORAL
  Filled 2024-08-17 (×4): qty 1

## 2024-08-17 MED ORDER — CHLORHEXIDINE GLUCONATE CLOTH 2 % EX PADS
6.0000 | MEDICATED_PAD | Freq: Every day | CUTANEOUS | Status: DC
  Administered 2024-08-17 – 2024-08-29 (×11): 6 via TOPICAL

## 2024-08-17 MED ORDER — ORAL CARE MOUTH RINSE: 15.0000 mL | OROMUCOSAL | Status: DC | PRN

## 2024-08-17 MED ORDER — INSULIN ASPART 100 UNIT/ML IJ SOLN: 0.0000 [IU] | Freq: Every day | INTRAMUSCULAR | Status: DC

## 2024-08-17 MED ORDER — NOREPINEPHRINE 4 MG/250ML-% IV SOLN
0.0000 ug/min | INTRAVENOUS | Status: DC
  Filled 2024-08-17: qty 250

## 2024-08-17 MED ORDER — POTASSIUM CHLORIDE 10 MEQ/100ML IV SOLN: 10.0000 meq | INTRAVENOUS | Status: DC

## 2024-08-17 MED ORDER — VANCOMYCIN HCL 1500 MG/300ML IV SOLN
1500.0000 mg | Freq: Once | INTRAVENOUS | Status: AC
  Administered 2024-08-17: 1500 mg via INTRAVENOUS
  Filled 2024-08-17: qty 300

## 2024-08-17 MED ORDER — POTASSIUM CHLORIDE 10 MEQ/100ML IV SOLN
10.0000 meq | INTRAVENOUS | Status: AC
  Administered 2024-08-17: 10 meq via INTRAVENOUS
  Filled 2024-08-17: qty 100

## 2024-08-17 MED ORDER — POTASSIUM CHLORIDE CRYS ER 20 MEQ PO TBCR: 20.0000 meq | EXTENDED_RELEASE_TABLET | Freq: Once | ORAL | Status: DC

## 2024-08-17 MED ORDER — BUMETANIDE 0.25 MG/ML IJ SOLN
2.0000 mg | Freq: Two times a day (BID) | INTRAMUSCULAR | Status: DC
  Administered 2024-08-17: 2 mg via INTRAVENOUS
  Filled 2024-08-17 (×2): qty 8

## 2024-08-17 MED ORDER — SODIUM CHLORIDE 0.9 % IV SOLN
2.0000 g | Freq: Two times a day (BID) | INTRAVENOUS | Status: DC
  Administered 2024-08-17 – 2024-08-18 (×2): 2 g via INTRAVENOUS
  Filled 2024-08-17 (×2): qty 12.5

## 2024-08-17 MED ORDER — LEVALBUTEROL HCL 1.25 MG/0.5ML IN NEBU: 1.2500 mg | INHALATION_SOLUTION | Freq: Four times a day (QID) | RESPIRATORY_TRACT | Status: DC | PRN

## 2024-08-17 MED ORDER — POTASSIUM CHLORIDE 10 MEQ/50ML IV SOLN
10.0000 meq | INTRAVENOUS | Status: AC
  Administered 2024-08-17 (×2): 10 meq via INTRAVENOUS
  Filled 2024-08-17 (×2): qty 50

## 2024-08-17 MED ORDER — BUMETANIDE 0.25 MG/ML IJ SOLN
1.0000 mg | Freq: Once | INTRAMUSCULAR | Status: DC
  Filled 2024-08-17: qty 4

## 2024-08-17 MED ORDER — POTASSIUM CHLORIDE 10 MEQ/50ML IV SOLN
10.0000 meq | Freq: Once | INTRAVENOUS | Status: AC
  Administered 2024-08-17: 10 meq via INTRAVENOUS
  Filled 2024-08-17: qty 50

## 2024-08-17 NOTE — Progress Notes (Addendum)
 This afternoon, pt having reoccurring beats of vtach and occasional PVC's. Checked BP 103/65. Pt more lethargic than this AM. Oriented to person, place and date but cannot stay awake to hold conversation. Placed pt on bipap in bed since cannot stay awake and that is pt preference. Notified Patwardhan, MD via page at 1410. [name] Dunn, PA was then notified, along with Bensimhon, MD- came to bedside. Labs and orders for potassium and magnesium  replacement via IV placed. Mag and potassium hung. Orders placed to transfer to [facility] at 1518. This RN gave report to [place], RN over phone. Pt belongings- cellphone, purse, wallet, clothing, shoes transferred with patient to [facility]. Patient transferred via bed.

## 2024-08-17 NOTE — Progress Notes (Signed)
 Pt. Is refusing at this time to wear bipap, will follow up later

## 2024-08-17 NOTE — Consult Note (Signed)
 "  NAME:  Tanya Hogan, MRN:  [id number], DOB:  02-Jun-1951, LOS: 1 ADMISSION DATE:  08/16/2024, CONSULTATION DATE:  08/17/2024 REFERRING MD:  Darci Pore, MD , CHIEF COMPLAINT: Altered mental status  History of Present Illness:  73 year old female with chronic HFrEF, EF 20-25%, NASH cirrhosis, diabetes type 2, hypertension who initially presented with increasing shortness of breath and bilateral lower extremity edema, admitted under hospitalist service.  She was started on Bumex  with that she became hypotensive, then diuretics were hold.  On workup she was noted to have a procalcitonin of 14 with elevated white count of 16 upon admission, x-ray chest showed no acute abnormalities, UA is pending, blood cultures were sent, lactate was 2.3 On 12/25 she was found minimally responsive with systolic blood pressure 90, requiring BiPAP, advanced heart failure was initially called, decision was to transfer the patient to ICU to further evaluate.  During my evaluation patient denies chest pain, shortness of breath, headache, fever, chills, she is on room air, lethargic but opens eyes with vocal stimuli, following simple commands  Pertinent  Medical History   Past Medical History:  Diagnosis Date   Alkaline phosphatase elevation    Asthma    Breast cancer (HCC) 2011   Carpal tunnel syndrome    bilateral   Chronic HFrEF (heart failure with reduced ejection fraction) (HCC)    Cirrhosis, non-alcoholic (HCC)    Closed fracture of unspecified part of radius (alone)    DDD (degenerative disc disease), lumbar    Depression    pt denies   Diabetes mellitus without complication (HCC)    Enlarged heart    Fibromyalgia    GERD (gastroesophageal reflux disease)    History of kidney stones    HTN (hypertension)    Hypothyroidism    IBS (irritable bowel syndrome)    LBBB (left bundle branch block)    Microcytic anemia 05/14/2017   Mitral regurgitation    NICM (nonischemic cardiomyopathy) (HCC)     Pneumonia    Presence of permanent cardiac pacemaker    Pulmonary hypertension (HCC)    PVD (peripheral vascular disease)    2019   Sleep apnea    uses cpap   Tricuspid regurgitation    Ventricular tachyarrhythmia (HCC)      Significant Hospital Events: Including procedures, antibiotic start and stop dates in addition to other pertinent events   12/24 admitted with acute on chronic HFrEF 12/25 transferred to ICU for altered mental status  Interim History / Subjective:  As above  Objective    Blood pressure (!) 82/67, pulse 89, temperature 98.4 F (36.9 C), temperature source Oral, resp. rate 18, height 5' 1 (1.549 m), weight 78.2 kg, SpO2 100%.    FiO2 (%):  [21 %] 21 %   Intake/Output Summary (Last 24 hours) at 08/17/2024 1724 Last data filed at 08/17/2024 0400 Gross per 24 hour  Intake 300 ml  Output 200 ml  Net 100 ml   Filed Weights   08/16/24 1545 08/17/24 0320  Weight: 78.5 kg 78.2 kg    Examination: General: Acute on chronically ill-appearing elderly obese female, lying on the bed HEENT: [place]/AT, eyes anicteric.  Dry mucus membranes Neuro: Lethargic, opens eyes with vocal stimuli, following simple commands Chest: Coarse breath sounds, no wheezes or rhonchi.  AICD in place on left side of chest Heart: Paced rhythm, no murmurs or gallops Abdomen: Soft, nontender, nondistended, bowel sounds present   Resolved problem list   Assessment and Plan  Sepsis, unclear origin  Acute on chronic biventricular HFrEF and HFpEF status post AICD Hyponatremia Complete heart block status post pacemaker Diabetes type 2 NASH liver cirrhosis Hypothyroidism Obesity  Patient's white count is elevated to 16 upon admission with procalcitonin of 14 X-ray chest showed no infiltrates, UA is pending Blood cultures have been sent She is on broad-spectrum antibiotics with vancomycin  and cefepime  She was initially admitted with acute on chronic biventricular HFrEF, was started  on Bumex , blood pressure was borderline, Bumex  was stopped To me on physical exam she looks a dry, will send coox and lactate Monitor CVP Continue midodrine  Will give her 1 bottle of albumin Monitor electrolytes Continue sliding scale insulin  with CBG goal 140-180 Continue levothyroxine  Diet and Counseling as appropriate Advanced heart failure team is following   Labs   CBC: Recent Labs  Lab 08/16/24 0707 08/17/24 0449  WBC 16.0* 14.3*  HGB 13.1 13.2  HCT 39.8 40.2  MCV 94.5 93.3  PLT 184 170    Basic Metabolic Panel: Recent Labs  Lab 08/14/24 1542 08/16/24 0707 08/16/24 1749 08/17/24 0449 08/17/24 1506  NA 132* 133*  --  130* 133*  K 4.1 3.7  --  3.8 3.6  CL 97 97*  --  94* 95*  CO2 20 21*  --  22 25  GLUCOSE 116* 114*  --  164* 124*  BUN 12 22  --  22 19  CREATININE 0.85 1.02*  --  0.90 0.93  CALCIUM  10.2 9.5  --  9.4 9.1  MG  --   --  1.8  --  1.9  PHOS  --   --  2.2*  --   --    GFR: Estimated Creatinine Clearance: 49.5 mL/min (by C-G formula based on SCr of 0.93 mg/dL). Recent Labs  Lab 08/16/24 0707 08/16/24 1749 08/17/24 0449 08/17/24 1506  PROCALCITON  --  14.80  --   --   WBC 16.0*  --  14.3*  --   LATICACIDVEN  --   --   --  2.3*    Liver Function Tests: Recent Labs  Lab 08/14/24 1542 08/17/24 0449 08/17/24 1506  AST 72* 98* 87*  ALT 36* 57* 57*  ALKPHOS 156* 185* 201*  BILITOT 1.4* 1.2 1.1  PROT 6.8 6.7 6.8  ALBUMIN 3.8 3.3* 3.3*   No results for input(s): LIPASE, AMYLASE in the last 168 hours. Recent Labs  Lab 08/17/24 1506  AMMONIA 22    ABG    Component Value Date/Time   PHART 7.463 (H) 05/18/2024 1527   PCO2ART 33.4 05/18/2024 1527   PO2ART 68 (L) 05/18/2024 1527   HCO3 26.9 05/18/2024 1602   TCO2 24 07/13/2024 1444   ACIDBASEDEF 6.0 (H) 05/18/2024 1554   O2SAT 69 05/18/2024 1602     Coagulation Profile: No results for input(s): INR, PROTIME in the last 168 hours.  Cardiac Enzymes: No results for  input(s): CKTOTAL, CKMB, CKMBINDEX, TROPONINI in the last 168 hours.  HbA1C: HbA1c, POC (controlled diabetic range)  Date/Time Value Ref Range Status  01/14/2021 11:04 AM 6.2 0.0 - 7.0 % Final   HbA1c POC (<> result, manual entry)  Date/Time Value Ref Range Status  04/08/2022 11:22 AM 5.5 4.0 - 5.6 % Final   Hgb A1c MFr Bld  Date/Time Value Ref Range Status  08/17/2024 08:09 AM 5.9 (H) 4.8 - 5.6 % Final    Comment:    (NOTE) Diagnosis of Diabetes The following HbA1c ranges recommended by the American Diabetes Association (ADA) may be used  as an aid in the diagnosis of diabetes mellitus.  Hemoglobin             Suggested A1C NGSP%              Diagnosis  <5.7                   Non Diabetic  5.7-6.4                Pre-Diabetic  >6.4                   Diabetic  <7.0                   Glycemic control for                       adults with diabetes.    06/05/2024 01:47 PM 5.5 4.8 - 5.6 % Final    Comment:    (NOTE) Diagnosis of Diabetes The following HbA1c ranges recommended by the American Diabetes Association (ADA) may be used as an aid in the diagnosis of diabetes mellitus.  Hemoglobin             Suggested A1C NGSP%              Diagnosis  <5.7                   Non Diabetic  5.7-6.4                Pre-Diabetic  >6.4                   Diabetic  <7.0                   Glycemic control for                       adults with diabetes.      CBG: Recent Labs  Lab 08/17/24 0755 08/17/24 1234  GLUCAP 208* 185*    Review of Systems:   12 point review of system is significant for complaint mentioned in HPI, rest negative  Past Medical History:  She,  has a past medical history of Alkaline phosphatase elevation, Asthma, Breast cancer (HCC) (2011), Carpal tunnel syndrome, Chronic HFrEF (heart failure with reduced ejection fraction) (HCC), Cirrhosis, non-alcoholic (HCC), Closed fracture of unspecified part of radius (alone), DDD (degenerative disc  disease), lumbar, Depression, Diabetes mellitus without complication (HCC), Enlarged heart, Fibromyalgia, GERD (gastroesophageal reflux disease), History of kidney stones, HTN (hypertension), Hypothyroidism, IBS (irritable bowel syndrome), LBBB (left bundle branch block), Microcytic anemia (05/14/2017), Mitral regurgitation, NICM (nonischemic cardiomyopathy) (HCC), Pneumonia, Presence of permanent cardiac pacemaker, Pulmonary hypertension (HCC), PVD (peripheral vascular disease), Sleep apnea, Tricuspid regurgitation, and Ventricular tachyarrhythmia (HCC).   Surgical History:   Past Surgical History:  Procedure Laterality Date   BIV ICD GENERATOR CHANGEOUT N/A 05/31/2024   Procedure: BIV ICD GENERATOR CHANGEOUT;  Surgeon: Almetta Donnice LABOR, MD;  Location: [facility] INVASIVE CV LAB;  Service: Cardiovascular;  Laterality: N/A;   BIV PACEMAKER GENERATOR CHANGEOUT N/A 11/03/2021   Procedure: BIV PACEMAKER GENERATOR CHANGEOUT;  Surgeon: Waddell Danelle ORN, MD;  Location: [facility] INVASIVE CV LAB;  Service: Cardiovascular;  Laterality: N/A;   BREAST LUMPECTOMY Left 2011   COLONOSCOPY     FOOT SURGERY     KNEE SURGERY     LEAD INSERTION N/A 05/31/2024   Procedure: LEAD INSERTION;  Surgeon: Almetta,  Donnice LABOR, MD;  Location: [facility] INVASIVE CV LAB;  Service: Cardiovascular;  Laterality: N/A;   LEAD REVISION/REPAIR N/A 11/03/2021   Procedure: LEAD REVISION/REPAIR;  Surgeon: Waddell Danelle ORN, MD;  Location: [facility] INVASIVE CV LAB;  Service: Cardiovascular;  Laterality: N/A;   MASTECTOMY Right    05/2019   PACEMAKER GENERATOR CHANGE N/A 08/23/2014   Procedure: PACEMAKER GENERATOR CHANGE;  Surgeon: Danelle ORN Waddell, MD;  Location: [facility] CATH LAB;  Service: Cardiovascular;  Laterality: N/A;   PACEMAKER PLACEMENT  2009   [facility]   RECTAL SURGERY  1976   REDUCTION MAMMAPLASTY Left    05/2019   RIGHT/LEFT HEART CATH AND CORONARY ANGIOGRAPHY N/A 05/18/2024   Procedure: RIGHT/LEFT HEART CATH AND CORONARY ANGIOGRAPHY;  Surgeon:  Wendel Lurena POUR, MD;  Location: [facility] INVASIVE CV LAB;  Service: Cardiovascular;  Laterality: N/A;   TOTAL SHOULDER ARTHROPLASTY Right 03/10/2018   TOTAL SHOULDER ARTHROPLASTY Right 03/10/2018   Procedure: RIGHT TOTAL SHOULDER ARTHROPLASTY;  Surgeon: Dozier Soulier, MD;  Location: [facility] OR;  Service: Orthopedics;  Laterality: Right;   TUBAL LIGATION     UPPER EXTREMITY VENOGRAPHY  05/18/2024   Procedure: UPPER EXTREMITY VENOGRAPHY;  Surgeon: Wendel Lurena POUR, MD;  Location: [facility] INVASIVE CV LAB;  Service: Cardiovascular;;     Social History:   reports that she quit smoking about 58 years ago. Her smoking use included cigarettes. She has never used smokeless tobacco. She reports that she does not drink alcohol  and does not use drugs.   Family History:  Her family history includes Breast cancer in an other family member; Heart disease in her brother, father, and mother; Heart failure in an other family member; Hypertension in an other family member; Melanoma in her mother; Parkinson's disease in her mother.   Allergies Allergies[1]   Home Medications  Prior to Admission medications  Medication Sig Start Date End Date Taking? Authorizing Provider  albuterol  (PROVENTIL ) (2.5 MG/3ML) 0.083% nebulizer solution Take 2.5 mg by nebulization daily as needed for wheezing or shortness of breath.   Yes [provider]  albuterol  (VENTOLIN  HFA) 108 (90 Base) MCG/ACT inhaler Inhale 2 puffs into the lungs every 6 (six) hours as needed for wheezing or shortness of breath. 07/24/24  Yes [name], MD  alendronate  (FOSAMAX ) 70 MG tablet TAKE 1 TABLET BY MOUTH EVERY 7  DAYS WITH FULL GLASS OF WATER  ON AN EMPTY STOMACH 05/26/24  Yes [name], MD  amiodarone  (PACERONE ) 200 MG tablet Take 1 tablet (200 mg total) by mouth daily. 07/03/24  Yes [name], MD  aspirin  81 MG chewable tablet Chew 81 mg by mouth daily.   Yes [provider]  Calcium  Carbonate-Vit D-Min  (CALCIUM  600+D PLUS MINERALS) 600-400 MG-UNIT TABS 1 tab p.o. twice daily 11/02/23  Yes Curtis Debby PARAS, MD  EPINEPHrine  0.3 mg/0.3 mL IJ SOAJ injection Inject 0.3 mg into the muscle as needed for anaphylaxis. 08/27/23  Yes [name], MD  fexofenadine  (ALLEGRA ) 180 MG tablet Take 180 mg by mouth daily.   Yes [provider]  fluticasone -salmeterol (WIXELA INHUB) 250-50 MCG/ACT AEPB Inhale 1 puff into the lungs in the morning and at bedtime.   Yes [provider]  levothyroxine  (SYNTHROID ) 112 MCG tablet TAKE 1 TABLET BY MOUTH DAILY 07/05/24  Yes [name], MD  metFORMIN  (GLUCOPHAGE -XR) 500 MG 24 hr tablet Take 1 tablet (500 mg total) by mouth daily. Per 08/09/24 discharge states 1 tablet twice daily - patient states PCP has her  on this once daily and they plan to discuss once or twice daily at 08/14/24 appt Patient taking differently: Take 500 mg by mouth daily. 08/14/24  Yes [name], MD  Multiple Vitamins-Minerals (MULTIVITAMIN WOMEN 50+) TABS Take 1 tablet by mouth daily. Patient taking differently: Take 1 tablet by mouth daily. Patient states she takes Multivitamin Adult  that has iron and not the women's 50 plus   Yes [provider]  pantoprazole  (PROTONIX ) 40 MG tablet TAKE 1 TABLET BY MOUTH DAILY  BEFORE BREAKFAST 10/20/22  Yes [name], MD  pravastatin  (PRAVACHOL ) 40 MG tablet Take 40 mg by mouth at bedtime. 01/26/24  Yes [provider]  quiNIDine sulfate  200 MG tablet Take 1 tablet (200 mg total) by mouth 3 (three) times daily. 06/07/24  Yes Ollis, Brandi L, NP  SPIRIVA  RESPIMAT 2.5 MCG/ACT AERS Inhale 2 puffs into the lungs daily. 06/21/24  Yes [provider]  spironolactone  (ALDACTONE ) 25 MG tablet Take 0.5 tablets (12.5 mg total) by mouth daily. 06/08/24 09/06/24 Yes Pietro Redell RAMAN, MD  torsemide  (DEMADEX ) 20 MG tablet Take 1 tablet (20 mg total) by mouth daily. Take 2 tablets daily in case  of weight gain 2 to 3 lbs in 24 hrs or 5 lbs in 7 days 08/08/24  Yes Arrien, Elidia Sieving, MD  valACYclovir  (VALTREX ) 1000 MG tablet Take 1 tablet (1,000 mg total) by mouth 2 (two) times daily. 05/17/23  Yes Alvia Bring, DO       Valinda Novas, MD [facility] See Amion for pager If no response to pager, please call [phone] until 7pm After 7pm, Please call E-link [phone]     [1]  Allergies Allergen Reactions   Injectafer [Ferric Carboxymaltose] Shortness Of Breath    Mild SOB following injectafer infusion    Penicillins Anaphylaxis and Rash    Tolerated ceftin  2020   Accupril [Quinapril Hcl] Other (See Comments)    Hyperkalemia=high potassium level   Celebrex [Celecoxib] Other (See Comments)    Hx hematemesis, bleeding ulcer   Cozaar  [Losartan  Potassium] Itching   Dml Forte Rash   Entresto [Sacubitril-Valsartan] Other (See Comments)    Hyperkalemia   Flonase  [Fluticasone ] Other (See Comments)    Hx nasal ulcer   Lactose Intolerance (Gi) Diarrhea   Lipitor [Atorvastatin ] Other (See Comments)    Myalgias    Nasonex  [Mometasone  Furoate] Other (See Comments)    Epistaxis    Nsaids Other (See Comments)    Hx hematemesis, bleeding ulcer   Advil [Ibuprofen] Nausea And Vomiting    Hx hematemesis, bleeding ulcer   Baking Soda-Fluoride [Sodium Fluoride] Itching and Rash   Bayer Aspirin  [Aspirin ] Other (See Comments)    Hx hematemesis, bleeding ulcer   Chamomile Hives   Clindamycin /Lincomycin Rash   Codeine Nausea And Vomiting   Lasix  [Furosemide ] Other (See Comments)    Tinnitus  Hypotension   Miralax  [Polyethylene Glycol (Macrogol)] Other (See Comments)    Leaky gut syndrome   Quinine Rash   Reclast  [Zoledronic  Acid] Rash and Other (See Comments)    Tremors    Rifadin [Rifampin] Other (See Comments)    Unknown reaction   Singulair  [Montelukast ] Other (See Comments)    Dizziness    Sulfa Antibiotics Rash   Ultram  [Tramadol ] Anxiety and  Rash   Vesicare  [Solifenacin ] Other (See Comments)    Urinary retention   Wellbutrin  [Bupropion ] Other (See Comments)    Myalgias  Skin crawling sensation   Wool Alcohol  [Lanolin] Rash   "

## 2024-08-17 NOTE — Progress Notes (Signed)
 "   Patient Name: Tanya Hogan Date of Encounter: 08/17/2024 [facility] Cardiologist: Redell Shallow, MD   Interval Summary  .    Breathing improved, not at baseline  Patient lives with  her brother, but makes simple meals herself, daughter helps sometimes with bringing meals. She reportedly takes medications, as much as she can remember.  She has issues with transportation, therefore has not been able to make it office visits lately.  Vital Signs .    Vitals:   08/16/24 1545 08/16/24 1946 08/17/24 0025 08/17/24 0320  BP: 95/60 114/62 (!) 103/57 (!) 103/58  Pulse: 89  90 89  Resp: 20 18 20 19   Temp: 97.8 F (36.6 C) 97.8 F (36.6 C) 97.8 F (36.6 C) 98 F (36.7 C)  TempSrc: Oral Oral Oral Oral  SpO2: 97% 100% 94% 100%  Weight: 78.5 kg   78.2 kg  Height: 5' 1 (1.549 m)       Intake/Output Summary (Last 24 hours) at 08/17/2024 0603 Last data filed at 08/17/2024 0400 Gross per 24 hour  Intake 540 ml  Output 350 ml  Net 190 ml      08/17/2024    3:20 AM 08/16/2024    3:45 PM 08/14/2024    3:36 PM  Last 3 Weights  Weight (lbs) 172 lb 8 oz 173 lb 179 lb  Weight (kg) 78.245 kg 78.472 kg 81.194 kg      Telemetry/ECG    Telemetry 08/17/2024:- Personally Reviewed Paced rhythm  Echocardiogram 05/13/2024: 1. No apical thrombus with Definity  contrast. Left ventricular ejection  fraction, by estimation, is 20 to 25%. Left ventricular ejection fraction  by 2D MOD biplane is 25.1 %. The left ventricle has severely decreased  function. The left ventricle  demonstrates global hypokinesis. The left ventricular internal cavity size  was moderately dilated. Left ventricular diastolic parameters are  consistent with Grade II diastolic dysfunction (pseudonormalization).  Elevated left ventricular end-diastolic  pressure.   2. Right ventricular systolic function is normal. The right ventricular  size is moderately enlarged. There is severely elevated pulmonary  artery  systolic pressure. The estimated right ventricular systolic pressure is  64.6 mmHg.   3. Left atrial size was severely dilated.   4. Right atrial size was severely dilated.   5. The mitral valve is degenerative. Mild mitral valve regurgitation.  Moderate mitral annular calcification.   6. The tricuspid valve is abnormal. Tricuspid valve regurgitation is mild  to moderate.   7. The aortic valve is tricuspid. Aortic valve regurgitation is not  visualized. Aortic valve sclerosis/calcification is present, without any  evidence of aortic stenosis.   8. The inferior vena cava is dilated in size with <50% respiratory  variability, suggesting right atrial pressure of 15 mmHg.   Comparison(s): Changes from prior study are noted. 10/30/2022: LVEF 20-25%,  global HK.   Cardiac catheterization 04/2024: 1.  Normal right dominant circulation with no obstructive coronary artery disease. 2.  Venography from the left axillary demonstrated that the subclavian vein proximal to the insertion site of multiple leads is patent however a wire and catheter could not cross the this confluence. 3.  Fick cardiac output of 5.7 L/min and Fick cardiac index of 3.1 L/min/m with following hemodynamics:            Right atrial pressure mean of 8 mmHg            Right ventricular pressure 52/1 with an end-diastolic pressure of 10 mmHg  Wedge pressure mean of 23 mmHg with V waves to 29 mmHg            PA pressure of 51/27 with mean of 37 mmHg            PVR of PVR of 2.5 Woods units            PA pulsatility index of 3 4.  LVEDP of 21 to 24 mmHg across the cardiac cycle    Physical Exam .   Physical Exam Vitals and nursing note reviewed.  Constitutional:      General: She is not in acute distress. Neck:     Vascular: No JVD.  Cardiovascular:     Rate and Rhythm: Normal rate and regular rhythm.     Heart sounds: Normal heart sounds. No murmur heard. Pulmonary:     Effort: Pulmonary effort is  normal.     Breath sounds: Examination of the right-lower field reveals rales. Examination of the left-lower field reveals rales. Rales present. No wheezing.  Musculoskeletal:     Right lower leg: Edema (Trace) present.     Left lower leg: Edema (1+) present.      Assessment & Plan .     73 y.o. female with hypertension, type 2 diabetes mellitus, nonischemic cardiomyopathy with chronic systolic heart failure, s/p CRT-D, NASH, OSA, GERD admitted with acute on chronic HFrEF  Acute on chronic HFrEF: Acute heart failure decompensation, second hospitalization in 2 weeks. proBNP 27k--> 22K. Only 350 cc urine output documented overnight. Currently on IV Bumex  1 mg twice daily. She still appears mildly volume overloaded.  Increase IV Bumex  to 2 mg twice daily. Not on rest of the GDMT due to low blood pressure, not on SGLT2 inhibitor due to history of urinary tract infections. Also on midodrine  2.5 mg 3 times daily due to low blood pressures. Last echocardiogram in 04/2024, recommend repeat echocardiogram.  Ultimately, I think her social factors are contributing heavily and her recurrent heart failure admissions.  Recommend social work consult.  She will need home health for medications at home, also needs transportation help for outpatient visits.  She has not been seen in heart failure clinic in a long time.  Currently, appointment is scheduled to see Dr. Pietro in February.  I strongly recommend appointment with heart failure clinic within 2 weeks of hospital discharge.  Hyponatremia: Transient hyponatremia secondary to congestive heart failure.  Continue diuresis.  Elevated liver enzymes: Secondary to congestive heart failure.  Continue diuresis.  Ventricular tachycardia: Prior history of VT storm, thus on amiodarone  and quinidine, continue the same.  Anticipate hospital stay for another 2-3 days.   For questions or updates, please contact [facility] Please consult  [email or web address] for contact info under        Signed, Newman JINNY Lawrence, MD   "

## 2024-08-17 NOTE — Procedures (Signed)
 Central Venous Catheter Insertion Procedure Note  EMMILYN CROOKE  [id number]  23-Aug-1951  Date:08/17/2024  Time:5:22 PM   Provider Performing:Jeremie Giangrande   Procedure: Insertion of Non-tunneled Central Venous [phone]) with US  guidance (23062)   Indication(s) Medication administration  Consent Risks of the procedure as well as the alternatives and risks of each were explained to the patient and/or caregiver.  Consent for the procedure was obtained and is signed in the bedside chart  Anesthesia Topical only with 1% lidocaine    Timeout Verified patient identification, verified procedure, site/side was marked, verified correct patient position, special equipment/implants available, medications/allergies/relevant history reviewed, required imaging and test results available.  Sterile Technique Maximal sterile technique including full sterile barrier drape, hand hygiene, sterile gown, sterile gloves, mask, hair covering, sterile ultrasound probe cover (if used).  Procedure Description Area of catheter insertion was cleaned with chlorhexidine  and draped in sterile fashion.  With real-time ultrasound guidance a central venous catheter was placed into the left internal jugular vein. Nonpulsatile blood flow and easy flushing noted in all ports.  The catheter was sutured in place and sterile dressing applied.  Complications/Tolerance None; patient tolerated the procedure well. Chest X-ray is ordered to verify placement for internal jugular or subclavian cannulation.   Chest x-ray is not ordered for femoral cannulation.  EBL Minimal  Specimen(s) None

## 2024-08-17 NOTE — Progress Notes (Signed)
 BIPAP on standby in room. CCM made aware Pt to wear at bedside and as needed.

## 2024-08-17 NOTE — Progress Notes (Addendum)
 Nurse notified that patient appearing sleepier/more lethargic this afternoon - earlier today was up in chair at bedside, now on BiPAP for afternoon nap but even with BiPAP awakens briefly to voice them falls back asleep. Denies any complaints, states she feels okay. A+Ox3. SBP 80s earlier today, currently 103/65 with HR 80s. Telemetry shows AV paced rhythm with occasional PVCs and infrequent brief episodes of NSVT 3-7 beats. Nurse working on clarifying UOP from day shift tech but per report has not been much, also ~250 in canister from unknown period of time, no longer hooked up to purewick - was assisted to bathroom earlier. Last lytes Mg 1.8 (12/24), K 3.8 (12/25) - will supplement 2g mag sulfate and 20 meq KCl given IV given lethargy. Change diet to NPO until mental status more clear. Ordered stat lytes along with ammonia and lactate level. Discussed with Dr. Belita Warsame given concern for low output state -> will transfer to 2H and he will evaluate ASAP. Also note 12/24 procalcitonin of 14.8, WBC slightly downtrending this AM but still elevated at 14.3. Requested urine sent for UA, otherwise will review with MD. Also updated IM attending.  [name] Dunn< PA-C  Agree with above  73 y/o woman with chronic HFrEF s/p CRT, DM2, cirrhosis dut to NASH  Admitted 2 weeks ago for HF.  Readmitted yesterday with recurrent SOB  Today became more lethargic and SOB. Poor response to IV Bumex   WBC 16K PCT 14  CXR clear   Now on bipap, fatigued but oriented  Echo EF 20-25% RV ok   General:  Sitting up in bed. On bipap. Chronically ill appearing HEENT: normal + bipap Neck: supple. Hard to see jvp  Cor: Regular rate & rhythm. No rubs, gallops or murmurs. Lungs: tachypneic Abdomen: soft, nontender, nondistended.Good bowel sounds. Extremities: no cyanosis, clubbing, rash, edema Neuro: alert & orientedx3, cranial nerves grossly intact. moves all 4 extremities w/o difficulty. Affect pleasant  Unclear if her  respiratory distress is due primarily to HF or infectious process.  Will move to ICU. Place central access to measure CVP and co-ox. Wil ask CCM to see as well.   Can add inotropes as needed. Check ammonia levels.   CRITICAL CARE Performed by: Cherrie Sieving  Total critical care time: 40 minutes  Critical care time was exclusive of separately billable procedures and treating other patients.  Critical care was necessary to treat or prevent imminent or life-threatening deterioration.  Critical care was time spent personally by me (independent of midlevel providers or residents) on the following activities: development of treatment plan with patient and/or surrogate as well as nursing, discussions with consultants, evaluation of patient's response to treatment, examination of patient, obtaining history from patient or surrogate, ordering and performing treatments and interventions, ordering and review of laboratory studies, ordering and review of radiographic studies, pulse oximetry and re-evaluation of patient's condition.  Sieving Cherrie, MD  4:09 PM

## 2024-08-17 NOTE — Progress Notes (Signed)
 " Progress Note   Patient: Tanya Hogan FMW:[id number] DOB: April 01, 1951 DOA: 08/16/2024     1 DOS: the patient was seen and examined on 08/17/2024   Brief hospital course: KATHERINNE MOFIELD is a 73 year old female with extensive history of progressive HFrEF 20-25%, nonischemic cardiomyopathy, HTN, DM II, depression, nonalcoholic liver cirrhosis, GERD, HTN, hypothyroidism,LBBB, chronic microcytic anemia, PVD presented with worsening shortness of breath, lower extremity edema.  Recent hospitalization discharge CHF exacerbation.  Patient was discharged on 08/08/24; on spironolactone  12.5 mg, and torsemide  20 mg p.o. daily.  She is admitted to hospitalist service for further management evaluation of acute on chronic systolic CHF.  Patient noted to be more lethargic, on BiPAP during afternoon.  Labs showed Pro-Cal of 14.8, WBC 14.3, lactic acid 2.3.   Assessment and Plan: * Acute on chronic congestive heart failure with left ventricular diastolic dysfunction (HCC) Patient presented tachypneic, hypotensive satting 89% room air Last echo 04/2024 reviewed, EF 20-25%, global hypokinesis, LV cavity with moderate dilatation, grade 2 diastolic dysfunction, RV function preserved, BNP 27,035. Repeat echo pending. She is started on IV Bumex  2 mg twice daily, but later due to her hypotension held. Continue to monitor daily weight, strict I's and O's. Discussed with cardiologist, patient need to be transferred to CCU for close monitoring of her hemodynamic status.  Hypotension- Continue to hold antihypertensive medications isosorbide , spironolactone . Diuretics on hold due to low blood pressures. Midodrine  5mg  tid started yesterday.  Sepsis-unknown origin Patient presented tachypneic, hypertensive, high WBC, lactic acid WBC 16, afebrile, tachypneic, productive cough Pro-Cal level 14.8, lactic acid 2.3. Viral panel negative, UA yet to be obtained. Chest x-ray unremarkable. Blood cultures ordered. Continue  to follow vitals closely. Transfer to [facility] for close monitoring. Prior MRSA positive. Will start her on vancomycin  and cefepime  therapy for sepsis of unknown origin  SVT (supraventricular tachycardia) Continue to monitor electrolytes including magnesium  and potassium Maintaining Magnesium >2.0,  Potassium >4.0 Continue home dose of Amiodarone , and Quinidine   COPD (chronic obstructive pulmonary disease) (HCC) Continue as needed inhalers, nebs Supplemental oxygen  to maintain saturation greater than 92%  AV block, 3rd degree (HCC) EKG reviewed, monitor Qtc Avoiding beta-blocker Continue telemetry.  Irritable bowel syndrome with constipation Continue home bowel regimen  Microcytic anemia With history of iron deficiency Monitor H&H, stable  Controlled diabetes mellitus type 2 with complications (HCC) Diabetes mellitus-well-controlled, with A1c of 5.5 Holding home medication of metformin ,  CBG q. ACHS, SSI coverage.  Biventricular cardiac pacemaker in situ Pacemaker in place, no signs of discharge, stable  Hyperlipidemia Continue statins  Gastroesophageal reflux disease Continue PPI  NASH (nonalcoholic steatohepatitis) Chronic, monitoring LFTs, currently stable,  Hypothyroidism Continue home dose Synthroid   History of left breast cancer In remission, follow-up as an outpatient  DDD (degenerative disc disease), cervical Continue as needed analgesics. Avoid opiates due to her low BP.  Obesity, Class I, BMI 30-34.9 Last calculated BMI 33.1 Monitoring weight, recommending outpatient follow-up for weight loss program  Physical deconditioning Consulted PT OT for evaluation recommendations Fall precautions       Out of bed to chair. Incentive spirometry. Nursing supportive care. Fall, aspiration precautions. Diet:  Diet Orders (From admission, onward)     Start     Ordered   08/17/24 1515  Diet NPO time specified  Diet effective now        08/17/24 1515            DVT prophylaxis: heparin  injection 5,000 Units Start: 08/16/24 1400 SCDs Start: 08/16/24 1020  Level of care: Telemetry   Code Status: Full Code  Subjective: Patient is seen and examined today morning.  She is sitting in chair, more alert and able to talk to me. This afternoon, patient's blood pressure is lower side, placed on BiPAP for a nap.  More lethargic, able to answer though.  Physical Exam: Vitals:   08/17/24 0320 08/17/24 0749 08/17/24 0902 08/17/24 1235  BP: (!) 103/58 (!) 89/58  (!) 82/67  Pulse: 89 89  89  Resp: 19 18  18   Temp: 98 F (36.7 C) 97.6 F (36.4 C)  98.4 F (36.9 C)  TempSrc: Oral Oral  Oral  SpO2: 100% 100% 98% 100%  Weight: 78.2 kg     Height:        General - Elderly ill looking obese Caucasian female, mild respiratory distress HEENT - PERRLA, EOMI, atraumatic head, non tender sinuses. Lung - Clear, basal rales, rhonchi, no wheezes.  On BiPAP Heart - S1, S2 heard, tachycardia, trace pedal edema. Abdomen - Soft, non tender, bowel sounds good Neuro - Alert, awake and oriented, non focal exam. Skin - Warm and dry.  Data Reviewed:      Latest Ref Rng & Units 08/17/2024    4:49 AM 08/16/2024    7:07 AM 08/02/2024    7:42 PM  CBC  WBC 4.0 - 10.5 K/uL 14.3  16.0  7.7   Hemoglobin 12.0 - 15.0 g/dL 86.7  86.8  87.7   Hematocrit 36.0 - 46.0 % 40.2  39.8  39.0   Platelets 150 - 400 K/uL 170  184  231       Latest Ref Rng & Units 08/17/2024    3:06 PM 08/17/2024    4:49 AM 08/16/2024    7:07 AM  BMP  Glucose 70 - 99 mg/dL 875  835  885   BUN 8 - 23 mg/dL 19  22  22    Creatinine 0.44 - 1.00 mg/dL 9.06  9.09  8.97   Sodium 135 - 145 mmol/L 133  130  133   Potassium 3.5 - 5.1 mmol/L 3.6  3.8  3.7   Chloride 98 - 111 mmol/L 95  94  97   CO2 22 - 32 mmol/L 25  22  21    Calcium  8.9 - 10.3 mg/dL 9.1  9.4  9.5    DG Chest 2 View Result Date: 08/16/2024 EXAM: 2 VIEW(S) XRAY OF THE CHEST 08/16/2024 07:25:00 AM COMPARISON: 08/14/2024  CLINICAL HISTORY: 73 year old female with shortness of breath. FINDINGS: LINES, TUBES AND DEVICES: Left chest ICD noted. LUNGS AND PLEURA: Progressed pulmonary vascularity. No edema. No confluent lung opacity. No pleural effusion. No pneumothorax. HEART AND MEDIASTINUM: Cardiomegaly. BONES AND SOFT TISSUES: Right shoulder arthroplasty noted. Multilevel degenerative changes of thoracic spine. No acute osseous abnormality. IMPRESSION: 1. No acute cardiopulmonary abnormality. Electronically signed by: Helayne Hurst MD 08/16/2024 07:30 AM EST RP Workstation: [id number]    Family Communication: Discussed with patient, understand and agree. All questions answered.  Disposition: Status is: Inpatient Remains inpatient appropriate because: Heart failure exacerbation, close hemodynamic, respiratory and telemetry monitoring, need ICU evaluation  Planned Discharge Destination: Home with Home Health     Time spent: 55 minutes  Author: Concepcion Riser, MD 08/17/2024 4:30 PM Secure chat 7am to 7pm For on call review [email or web address].    "

## 2024-08-17 NOTE — Progress Notes (Signed)
 Dr.Bensimhon updated of labs. Co-Ox OK, CVP up per his review. He recommends to hold off additional IV diuretic at this time and follow clinically. Appreciate coordination of care.

## 2024-08-17 NOTE — Progress Notes (Signed)
 Pharmacy Antibiotic Note  Tanya Hogan is a 73 y.o. female admitted on 08/16/2024 with SOB low EF 20% abd inc wbc 16 PCT 14, soft BP 80. Blood Cultures ordered, afebrile, Cr 1 crcl 30ml/min.  Pharmacy has been consulted for cefepime  and vancomycin  dosing.  Plan: Cefepime  2gm IV q12h - tolerated cephalosporin in past Vancomycin  1500mg  x1 then 1gm IV q24h - tolerated in past  Height: 5' 1 (154.9 cm) Weight: 78.2 kg (172 lb 8 oz) IBW/kg (Calculated) : 47.8  Temp (24hrs), Avg:97.9 F (36.6 C), Min:97.6 F (36.4 C), Max:98.4 F (36.9 C)  Recent Labs  Lab 08/14/24 1542 08/16/24 0707 08/17/24 0449 08/17/24 1506  WBC  --  16.0* 14.3*  --   CREATININE 0.85 1.02* 0.90 0.93  LATICACIDVEN  --   --   --  2.3*    Estimated Creatinine Clearance: 49.5 mL/min (by C-G formula based on SCr of 0.93 mg/dL).    Allergies[1]  Antimicrobials this admission:   Dose adjustments this admission:   Microbiology results: 12/25 BCx:    Olam Chalk Pharm.D. CPP, BCPS Clinical Pharmacist [phone] 08/17/2024 5:11 PM       [1]  Allergies Allergen Reactions   Injectafer [Ferric Carboxymaltose] Shortness Of Breath    Mild SOB following injectafer infusion    Penicillins Anaphylaxis and Rash    Tolerated ceftin  2020   Accupril [Quinapril Hcl] Other (See Comments)    Hyperkalemia=high potassium level   Celebrex [Celecoxib] Other (See Comments)    Hx hematemesis, bleeding ulcer   Cozaar  [Losartan  Potassium] Itching   Dml Forte Rash   Entresto [Sacubitril-Valsartan] Other (See Comments)    Hyperkalemia   Flonase  [Fluticasone ] Other (See Comments)    Hx nasal ulcer   Lactose Intolerance (Gi) Diarrhea   Lipitor [Atorvastatin ] Other (See Comments)    Myalgias    Nasonex  [Mometasone  Furoate] Other (See Comments)    Epistaxis    Nsaids Other (See Comments)    Hx hematemesis, bleeding ulcer   Advil [Ibuprofen] Nausea And Vomiting    Hx hematemesis, bleeding ulcer   Baking  Soda-Fluoride [Sodium Fluoride] Itching and Rash   Bayer Aspirin  [Aspirin ] Other (See Comments)    Hx hematemesis, bleeding ulcer   Chamomile Hives   Clindamycin /Lincomycin Rash   Codeine Nausea And Vomiting   Lasix  [Furosemide ] Other (See Comments)    Tinnitus  Hypotension   Miralax  [Polyethylene Glycol (Macrogol)] Other (See Comments)    Leaky gut syndrome   Quinine Rash   Reclast  [Zoledronic  Acid] Rash and Other (See Comments)    Tremors    Rifadin [Rifampin] Other (See Comments)    Unknown reaction   Singulair  [Montelukast ] Other (See Comments)    Dizziness    Sulfa Antibiotics Rash   Ultram  [Tramadol ] Anxiety and Rash   Vesicare  [Solifenacin ] Other (See Comments)    Urinary retention   Wellbutrin  [Bupropion ] Other (See Comments)    Myalgias  Skin crawling sensation   Wool Alcohol  [Lanolin] Rash

## 2024-08-17 NOTE — Progress Notes (Signed)
" °  Echocardiogram 2D Echocardiogram has been performed.  Tanya Hogan 08/17/2024, 4:17 PM "

## 2024-08-18 ENCOUNTER — Ambulatory Visit (HOSPITAL_COMMUNITY)

## 2024-08-18 DIAGNOSIS — I509 Heart failure, unspecified: Secondary | ICD-10-CM

## 2024-08-18 DIAGNOSIS — E119 Type 2 diabetes mellitus without complications: Secondary | ICD-10-CM | POA: Diagnosis not present

## 2024-08-18 DIAGNOSIS — K7460 Unspecified cirrhosis of liver: Secondary | ICD-10-CM | POA: Diagnosis not present

## 2024-08-18 DIAGNOSIS — Z95810 Presence of automatic (implantable) cardiac defibrillator: Secondary | ICD-10-CM

## 2024-08-18 DIAGNOSIS — I5020 Unspecified systolic (congestive) heart failure: Secondary | ICD-10-CM | POA: Diagnosis not present

## 2024-08-18 DIAGNOSIS — G9341 Metabolic encephalopathy: Secondary | ICD-10-CM | POA: Diagnosis not present

## 2024-08-18 DIAGNOSIS — I110 Hypertensive heart disease with heart failure: Secondary | ICD-10-CM

## 2024-08-18 DIAGNOSIS — K7581 Nonalcoholic steatohepatitis (NASH): Secondary | ICD-10-CM | POA: Diagnosis not present

## 2024-08-18 DIAGNOSIS — A4102 Sepsis due to Methicillin resistant Staphylococcus aureus: Secondary | ICD-10-CM | POA: Diagnosis not present

## 2024-08-18 DIAGNOSIS — E1165 Type 2 diabetes mellitus with hyperglycemia: Secondary | ICD-10-CM

## 2024-08-18 DIAGNOSIS — I4729 Other ventricular tachycardia: Secondary | ICD-10-CM | POA: Diagnosis not present

## 2024-08-18 DIAGNOSIS — R0602 Shortness of breath: Secondary | ICD-10-CM | POA: Diagnosis not present

## 2024-08-18 DIAGNOSIS — R7881 Bacteremia: Secondary | ICD-10-CM | POA: Diagnosis not present

## 2024-08-18 DIAGNOSIS — I5082 Biventricular heart failure: Secondary | ICD-10-CM

## 2024-08-18 DIAGNOSIS — E039 Hypothyroidism, unspecified: Secondary | ICD-10-CM | POA: Diagnosis not present

## 2024-08-18 DIAGNOSIS — Z950 Presence of cardiac pacemaker: Secondary | ICD-10-CM

## 2024-08-18 DIAGNOSIS — E66811 Obesity, class 1: Secondary | ICD-10-CM | POA: Diagnosis not present

## 2024-08-18 DIAGNOSIS — B9562 Methicillin resistant Staphylococcus aureus infection as the cause of diseases classified elsewhere: Secondary | ICD-10-CM | POA: Diagnosis not present

## 2024-08-18 DIAGNOSIS — Z6832 Body mass index (BMI) 32.0-32.9, adult: Secondary | ICD-10-CM | POA: Diagnosis not present

## 2024-08-18 DIAGNOSIS — I5023 Acute on chronic systolic (congestive) heart failure: Secondary | ICD-10-CM | POA: Diagnosis not present

## 2024-08-18 DIAGNOSIS — I442 Atrioventricular block, complete: Secondary | ICD-10-CM | POA: Diagnosis not present

## 2024-08-18 LAB — COOXEMETRY PANEL
Carboxyhemoglobin: 1.6 % — ABNORMAL HIGH (ref 0.5–1.5)
Methemoglobin: <0.7 % (ref 0.0–1.5)
O2 Saturation: 63.5 %
Total hemoglobin: 13.1 g/dL (ref 12.0–16.0)

## 2024-08-18 LAB — PRO BRAIN NATRIURETIC PEPTIDE: Pro Brain Natriuretic Peptide: 10332 pg/mL — ABNORMAL HIGH

## 2024-08-18 LAB — CBC
HCT: 38.4 % (ref 36.0–46.0)
Hemoglobin: 12.4 g/dL (ref 12.0–15.0)
MCH: 31.1 pg (ref 26.0–34.0)
MCHC: 32.3 g/dL (ref 30.0–36.0)
MCV: 96.2 fL (ref 80.0–100.0)
Platelets: 147 K/uL — ABNORMAL LOW (ref 150–400)
RBC: 3.99 MIL/uL (ref 3.87–5.11)
RDW: 14.6 % (ref 11.5–15.5)
WBC: 14.8 K/uL — ABNORMAL HIGH (ref 4.0–10.5)
nRBC: 0 % (ref 0.0–0.2)

## 2024-08-18 LAB — COMPREHENSIVE METABOLIC PANEL WITH GFR
ALT: 54 U/L — ABNORMAL HIGH (ref 0–44)
AST: 83 U/L — ABNORMAL HIGH (ref 15–41)
Albumin: 3 g/dL — ABNORMAL LOW (ref 3.5–5.0)
Alkaline Phosphatase: 186 U/L — ABNORMAL HIGH (ref 38–126)
Anion gap: 8 (ref 5–15)
BUN: 16 mg/dL (ref 8–23)
CO2: 26 mmol/L (ref 22–32)
Calcium: 8.9 mg/dL (ref 8.9–10.3)
Chloride: 100 mmol/L (ref 98–111)
Creatinine, Ser: 0.66 mg/dL (ref 0.44–1.00)
GFR, Estimated: >60 mL/min
Glucose, Bld: 123 mg/dL — ABNORMAL HIGH (ref 70–99)
Potassium: 3.6 mmol/L (ref 3.5–5.1)
Sodium: 135 mmol/L (ref 135–145)
Total Bilirubin: 1 mg/dL (ref 0.0–1.2)
Total Protein: 6.3 g/dL — ABNORMAL LOW (ref 6.5–8.1)

## 2024-08-18 LAB — BLOOD CULTURE ID PANEL (REFLEXED) - BCID2

## 2024-08-18 LAB — CG4 I-STAT (LACTIC ACID): Lactic Acid, Venous: 0.7 mmol/L (ref 0.5–1.9)

## 2024-08-18 LAB — GLUCOSE, CAPILLARY
Glucose-Capillary: 116 mg/dL — ABNORMAL HIGH (ref 70–99)
Glucose-Capillary: 134 mg/dL — ABNORMAL HIGH (ref 70–99)
Glucose-Capillary: 327 mg/dL — ABNORMAL HIGH (ref 70–99)
Glucose-Capillary: 125 mg/dL — ABNORMAL HIGH (ref 70–99)
Glucose-Capillary: 123 mg/dL — ABNORMAL HIGH (ref 70–99)
Glucose-Capillary: 120 mg/dL — ABNORMAL HIGH (ref 70–99)

## 2024-08-18 MED ORDER — POTASSIUM CHLORIDE CRYS ER 20 MEQ PO TBCR
40.0000 meq | EXTENDED_RELEASE_TABLET | Freq: Once | ORAL | Status: AC
  Administered 2024-08-18: 40 meq via ORAL
  Filled 2024-08-18: qty 2

## 2024-08-18 NOTE — Progress Notes (Signed)
 "  NAME:  Tanya Hogan, MRN:  [id number], DOB:  Aug 28, 1950, LOS: 2 ADMISSION DATE:  08/16/2024, CONSULTATION DATE:  08/17/2024 REFERRING MD:  Darci Pore, MD , CHIEF COMPLAINT: Altered mental status  History of Present Illness:  73 year old female with chronic HFrEF, EF 20-25%, NASH cirrhosis, diabetes type 2, hypertension who initially presented with increasing shortness of breath and bilateral lower extremity edema, admitted under hospitalist service.  She was started on Bumex  with that she became hypotensive, then diuretics were hold.  On workup she was noted to have a procalcitonin of 14 with elevated white count of 16 upon admission, x-ray chest showed no acute abnormalities, UA is pending, blood cultures were sent, lactate was 2.3 On 12/25 she was found minimally responsive with systolic blood pressure 90, requiring BiPAP, advanced heart failure was initially called, decision was to transfer the patient to ICU to further evaluate.  During my evaluation patient denies chest pain, shortness of breath, headache, fever, chills, she is on room air, lethargic but opens eyes with vocal stimuli, following simple commands  Pertinent  Medical History   Past Medical History:  Diagnosis Date   Alkaline phosphatase elevation    Asthma    Breast cancer (HCC) 2011   Carpal tunnel syndrome    bilateral   Chronic HFrEF (heart failure with reduced ejection fraction) (HCC)    Cirrhosis, non-alcoholic (HCC)    Closed fracture of unspecified part of radius (alone)    DDD (degenerative disc disease), lumbar    Depression    pt denies   Diabetes mellitus without complication (HCC)    Enlarged heart    Fibromyalgia    GERD (gastroesophageal reflux disease)    History of kidney stones    HTN (hypertension)    Hypothyroidism    IBS (irritable bowel syndrome)    LBBB (left bundle branch block)    Microcytic anemia 05/14/2017   Mitral regurgitation    NICM (nonischemic cardiomyopathy) (HCC)     Pneumonia    Presence of permanent cardiac pacemaker    Pulmonary hypertension (HCC)    PVD (peripheral vascular disease)    2019   Sleep apnea    uses cpap   Tricuspid regurgitation    Ventricular tachyarrhythmia (HCC)      Significant Hospital Events: Including procedures, antibiotic start and stop dates in addition to other pertinent events   12/24 admitted with acute on chronic HFrEF 12/25 transferred to ICU for altered mental status. CVC placed, antibiotics started.  Interim History / Subjective:  Overnight began to wake up more, off BiPAP this morning. She has had a cough for a few weeks, which she reports is basically the same. She still feels somewhat confused.  Recent PPM generator change & lead insertion 05/31/2024.  Objective    Blood pressure 108/68, pulse 89, temperature 97.8 F (36.6 C), temperature source Axillary, resp. rate (!) 28, height 5' 1 (1.549 m), weight 78.2 kg, SpO2 96%. CVP:  [10 mmHg-21 mmHg] 20 mmHg  FiO2 (%):  [28 %] 28 % PEEP:  [6 cmH20] 6 cmH20 Pressure Support:  [6 cmH20] 6 cmH20   Intake/Output Summary (Last 24 hours) at 08/18/2024 0928 Last data filed at 08/18/2024 0700 Gross per 24 hour  Intake 800 ml  Output 400 ml  Net 400 ml   Filed Weights   08/16/24 1545 08/17/24 0320  Weight: 78.5 kg 78.2 kg    Examination: General: elderly woman lying in bed in NAD HEENT: [place]/AT, eyes anicteric Neck: LIJ CVC  Neuro: Awake, alert, strong cough. Answering questions appropriately. Moving all extremities.  Chest:  breathing comfortably on [place], no rhonchi or wheezing. Heart: S1S2. RRR. Healing incision over PPM- no erythema. Abdomen: soft, NT   K+ 3.6 BUN 16 Cr 0.66 WBC 14.8 H/H 12.4/38.4 Platelets 147 Blood cultures: MRSA 4/4 CXR personally reviewed> possible LLL pneumonia. LIJ CVC in brachiocephalic vein. AICD. Previous right shoulder arthroplasty.    Resolved problem list   Hyponatremia  Assessment and Plan  Sepsis due to MRSA  bacteremia; frequent coughing and possible LLL infiltrate suggests likely pneumonia  -sputum culture -repeat blood cultures tomorrow -CVC placed concurrently with antibiotics being started yesterday. D/c today since not requiring pressors. -deescalate to vanc -ID consulted for MRSA bacteremia -eventually needs TEE  Acute on chronic biventricular HFrEF and HFpEF status post AICD Complete heart block status post pacemaker -euvolemic on exam, hold lasix  -GDMT on hold-- spiro only due to hypotension limiting titration  -EP consulted for PPM -tele monitoring  Acute metabolic encephalopathy due to sepsis -supportive care, con't antibiotics  Diabetes type 2, mild hyperglycemia -SSI PRN -hold PTA metformin  -goal BG 140-180  NASH liver cirrhosis Obesity; Body mass index is 32.59 kg/m. -long-term recommend modest weight loss  Hypothyroidism -synthroid   D/c central line DVT prophylaxis: [place] heparin   Stable to transfer back to the floor today and to [facility] care tomorrow morning.    Labs   CBC: Recent Labs  Lab 08/16/24 0707 08/17/24 0449 08/17/24 1745 08/18/24 0557  WBC 16.0* 14.3* 14.7* 14.8*  HGB 13.1 13.2 12.7 12.4  HCT 39.8 40.2 38.6 38.4  MCV 94.5 93.3 93.7 96.2  PLT 184 170 165 147*    Basic Metabolic Panel: Recent Labs  Lab 08/16/24 0707 08/16/24 1749 08/17/24 0449 08/17/24 1506 08/17/24 1745 08/18/24 0557  NA 133*  --  130* 133* 132* 135  K 3.7  --  3.8 3.6 3.6 3.6  CL 97*  --  94* 95* 94* 100  CO2 21*  --  22 25 26 26   GLUCOSE 114*  --  164* 124* 115* 123*  BUN 22  --  22 19 18 16   CREATININE 1.02*  --  0.90 0.93 0.83 0.66  CALCIUM  9.5  --  9.4 9.1 8.8* 8.9  MG  --  1.8  --  1.9 2.8*  --   PHOS  --  2.2*  --   --   --   --    GFR: Estimated Creatinine Clearance: 57.6 mL/min (by C-G formula based on SCr of 0.66 mg/dL). Recent Labs  Lab 08/16/24 0707 08/16/24 1749 08/17/24 0449 08/17/24 1506 08/17/24 1745 08/18/24 0514 08/18/24 0557   PROCALCITON  --  14.80  --   --   --   --   --   WBC 16.0*  --  14.3*  --  14.7*  --  14.8*  LATICACIDVEN  --   --   --  2.3* 1.2 0.7  --      Leita SHAUNNA Gaskins, DO 08/18/2024 2:23 PM [facility]  For contact information, see Amion. If no response to pager, please call PCCM consult pager. After hours, 7PM- 7AM, please call Elink.   "

## 2024-08-18 NOTE — Progress Notes (Signed)
 PHARMACY - PHYSICIAN COMMUNICATION CRITICAL VALUE ALERT - BLOOD CULTURE IDENTIFICATION (BCID)  Tanya Hogan is an 73 y.o. female who presented to [facility] on 08/16/2024 with a chief complaint of shortness of breath  Assessment:  MRSA bacteremia detected in 4 of 4 blood cultures.  Has cardiac device present.  Afebrile  Name of physician (or Provider) Contacted: Olam Chalk PharmD - rounding with critical care medicine.  Will also inform Dr. Dennise with ID.  Current antibiotics: Vancomycin  and cefepime   Changes to prescribed antibiotics recommended: stop cefepime  and continue vancomycin  Recommendations accepted by provider  Results for orders placed or performed during the hospital encounter of 08/16/24  Blood Culture ID Panel (Reflexed) (Collected: 08/17/2024  5:58 PM)  Result Value Ref Range   Enterococcus faecalis NOT DETECTED NOT DETECTED   Enterococcus Faecium NOT DETECTED NOT DETECTED   Listeria monocytogenes NOT DETECTED NOT DETECTED   Staphylococcus species DETECTED (A) NOT DETECTED   Staphylococcus aureus (BCID) DETECTED (A) NOT DETECTED   Staphylococcus epidermidis NOT DETECTED NOT DETECTED   Staphylococcus lugdunensis NOT DETECTED NOT DETECTED   Streptococcus species NOT DETECTED NOT DETECTED   Streptococcus agalactiae NOT DETECTED NOT DETECTED   Streptococcus pneumoniae NOT DETECTED NOT DETECTED   Streptococcus pyogenes NOT DETECTED NOT DETECTED   A.calcoaceticus-baumannii NOT DETECTED NOT DETECTED   Bacteroides fragilis NOT DETECTED NOT DETECTED   Enterobacterales NOT DETECTED NOT DETECTED   Enterobacter cloacae complex NOT DETECTED NOT DETECTED   Escherichia coli NOT DETECTED NOT DETECTED   Klebsiella aerogenes NOT DETECTED NOT DETECTED   Klebsiella oxytoca NOT DETECTED NOT DETECTED   Klebsiella pneumoniae NOT DETECTED NOT DETECTED   Proteus species NOT DETECTED NOT DETECTED   Salmonella species NOT DETECTED NOT DETECTED   Serratia marcescens NOT DETECTED NOT  DETECTED   Haemophilus influenzae NOT DETECTED NOT DETECTED   Neisseria meningitidis NOT DETECTED NOT DETECTED   Pseudomonas aeruginosa NOT DETECTED NOT DETECTED   Stenotrophomonas maltophilia NOT DETECTED NOT DETECTED   Candida albicans NOT DETECTED NOT DETECTED   Candida auris NOT DETECTED NOT DETECTED   Candida glabrata NOT DETECTED NOT DETECTED   Candida krusei NOT DETECTED NOT DETECTED   Candida parapsilosis NOT DETECTED NOT DETECTED   Candida tropicalis NOT DETECTED NOT DETECTED   Cryptococcus neoformans/gattii NOT DETECTED NOT DETECTED   Meth resistant mecA/C and MREJ DETECTED (A) NOT DETECTED    [name] 08/18/2024  8:16 AM

## 2024-08-18 NOTE — Consult Note (Signed)
 "   ELECTROPHYSIOLOGY CONSULT NOTE    Patient ID: Tanya Hogan MRN: [id number], DOB/AGE: Jul 05, 1951 73 y.o.  Admit date: 08/16/2024 Date of Consult: 08/18/2024  Primary Physician: [name], MD Primary Cardiologist: Redell Shallow, MD  Electrophysiologist: Dr. Almetta   Referring Provider: Dr. Gretta  Patient Profile: Tanya Hogan is a 73 y.o. female with a history of HFrEF s/p CRT, VT, T2DM, cirrhosis d/t NASH who is being seen today for the evaluation of bacteremia w CIED at the request of Dr. Cherrie.  HPI:  Tanya Hogan is a 73 y.o. female with PMH as above who is well-known to EP team.  She was recently hospitalized with VT storm 05/2024. During that time, she underwent upgrade of her CRT-P > CRT-D with the addition of RV lead, and capping of LV lead.   She presented to ER with increased SOB, lower extremity edema and started on bumx, became hypotensive. WBC elevated at admission, so UA, blood cultures drawn. Lactate was 2.3. Blood cultures 4/4 positive for MRSA. EP consulted for bacteremia with device in situ.    She is without acute complaints currently.    Labs Potassium3.6 (12/26 9442) Magnesium   2.8* (12/25 1745) Creatinine, ser  0.66 (12/26 0557) PLT  147* (12/26 0557) HGB  12.4 (12/26 0557) WBC 14.8* (12/26 0557)  .    Past Medical History:  Diagnosis Date   Alkaline phosphatase elevation    Asthma    Breast cancer (HCC) 2011   Carpal tunnel syndrome    bilateral   Chronic HFrEF (heart failure with reduced ejection fraction) (HCC)    Cirrhosis, non-alcoholic (HCC)    Closed fracture of unspecified part of radius (alone)    DDD (degenerative disc disease), lumbar    Depression    pt denies   Diabetes mellitus without complication (HCC)    Enlarged heart    Fibromyalgia    GERD (gastroesophageal reflux disease)    History of kidney stones    HTN (hypertension)    Hypothyroidism    IBS (irritable bowel syndrome)    LBBB (left  bundle branch block)    Microcytic anemia 05/14/2017   Mitral regurgitation    NICM (nonischemic cardiomyopathy) (HCC)    Pneumonia    Presence of permanent cardiac pacemaker    Pulmonary hypertension (HCC)    PVD (peripheral vascular disease)    2019   Sleep apnea    uses cpap   Tricuspid regurgitation    Ventricular tachyarrhythmia [facility])      Surgical History:  Past Surgical History:  Procedure Laterality Date   BIV ICD GENERATOR CHANGEOUT N/A 05/31/2024   Procedure: BIV ICD GENERATOR CHANGEOUT;  Surgeon: Almetta Donnice LABOR, MD;  Location: [facility] INVASIVE CV LAB;  Service: Cardiovascular;  Laterality: N/A;   BIV PACEMAKER GENERATOR CHANGEOUT N/A 11/03/2021   Procedure: BIV PACEMAKER GENERATOR CHANGEOUT;  Surgeon: Waddell Danelle ORN, MD;  Location: [facility] INVASIVE CV LAB;  Service: Cardiovascular;  Laterality: N/A;   BREAST LUMPECTOMY Left 2011   COLONOSCOPY     FOOT SURGERY     KNEE SURGERY     LEAD INSERTION N/A 05/31/2024   Procedure: LEAD INSERTION;  Surgeon: Almetta Donnice LABOR, MD;  Location: [facility] INVASIVE CV LAB;  Service: Cardiovascular;  Laterality: N/A;   LEAD REVISION/REPAIR N/A 11/03/2021   Procedure: LEAD REVISION/REPAIR;  Surgeon: Waddell Danelle ORN, MD;  Location: [facility] INVASIVE CV LAB;  Service: Cardiovascular;  Laterality: N/A;   MASTECTOMY Right    05/2019  PACEMAKER GENERATOR CHANGE N/A 08/23/2014   Procedure: PACEMAKER GENERATOR CHANGE;  Surgeon: Danelle LELON Birmingham, MD;  Location: [facility] CATH LAB;  Service: Cardiovascular;  Laterality: N/A;   PACEMAKER PLACEMENT  2009   [facility]   RECTAL SURGERY  1976   REDUCTION MAMMAPLASTY Left    05/2019   RIGHT/LEFT HEART CATH AND CORONARY ANGIOGRAPHY N/A 05/18/2024   Procedure: RIGHT/LEFT HEART CATH AND CORONARY ANGIOGRAPHY;  Surgeon: Wendel Lurena POUR, MD;  Location: [facility] INVASIVE CV LAB;  Service: Cardiovascular;  Laterality: N/A;   TOTAL SHOULDER ARTHROPLASTY Right 03/10/2018   TOTAL SHOULDER ARTHROPLASTY Right 03/10/2018   Procedure:  RIGHT TOTAL SHOULDER ARTHROPLASTY;  Surgeon: Dozier Soulier, MD;  Location: [facility] OR;  Service: Orthopedics;  Laterality: Right;   TUBAL LIGATION     UPPER EXTREMITY VENOGRAPHY  05/18/2024   Procedure: UPPER EXTREMITY VENOGRAPHY;  Surgeon: Wendel Lurena POUR, MD;  Location: [facility] INVASIVE CV LAB;  Service: Cardiovascular;;     Medications Prior to Admission  Medication Sig Dispense Refill Last Dose/Taking   albuterol  (PROVENTIL ) (2.5 MG/3ML) 0.083% nebulizer solution Take 2.5 mg by nebulization daily as needed for wheezing or shortness of breath.   Unknown   albuterol  (VENTOLIN  HFA) 108 (90 Base) MCG/ACT inhaler Inhale 2 puffs into the lungs every 6 (six) hours as needed for wheezing or shortness of breath. 18 g 1 Unknown   alendronate  (FOSAMAX ) 70 MG tablet TAKE 1 TABLET BY MOUTH EVERY 7  DAYS WITH FULL GLASS OF WATER  ON AN EMPTY STOMACH 12 tablet 3 Past Week   amiodarone  (PACERONE ) 200 MG tablet Take 1 tablet (200 mg total) by mouth daily.   08/15/2024   aspirin  81 MG chewable tablet Chew 81 mg by mouth daily.   08/15/2024   Calcium  Carbonate-Vit D-Min (CALCIUM  600+D PLUS MINERALS) 600-400 MG-UNIT TABS 1 tab p.o. twice daily 180 tablet 3 08/15/2024   EPINEPHrine  0.3 mg/0.3 mL IJ SOAJ injection Inject 0.3 mg into the muscle as needed for anaphylaxis. 2 each 1 Unknown   fexofenadine  (ALLEGRA ) 180 MG tablet Take 180 mg by mouth daily.   Past Week   fluticasone -salmeterol (WIXELA INHUB) 250-50 MCG/ACT AEPB Inhale 1 puff into the lungs in the morning and at bedtime.   08/15/2024   levothyroxine  (SYNTHROID ) 112 MCG tablet TAKE 1 TABLET BY MOUTH DAILY 100 tablet 0 08/15/2024   metFORMIN  (GLUCOPHAGE -XR) 500 MG 24 hr tablet Take 1 tablet (500 mg total) by mouth daily. Per 08/09/24 discharge states 1 tablet twice daily - patient states PCP has her on this once daily and they plan to discuss once or twice daily at 08/14/24 appt (Patient taking differently: Take 500 mg by mouth daily.)   08/15/2024   Multiple  Vitamins-Minerals (MULTIVITAMIN WOMEN 50+) TABS Take 1 tablet by mouth daily. (Patient taking differently: Take 1 tablet by mouth daily. Patient states she takes Multivitamin Adult  that has iron and not the women's 50 plus)   08/15/2024   pantoprazole  (PROTONIX ) 40 MG tablet TAKE 1 TABLET BY MOUTH DAILY  BEFORE BREAKFAST 90 tablet 3 08/15/2024   pravastatin  (PRAVACHOL ) 40 MG tablet Take 40 mg by mouth at bedtime.   08/15/2024   quiNIDine sulfate  200 MG tablet Take 1 tablet (200 mg total) by mouth 3 (three) times daily. 90 tablet 6 08/15/2024   SPIRIVA  RESPIMAT 2.5 MCG/ACT AERS Inhale 2 puffs into the lungs daily.   08/15/2024   spironolactone  (ALDACTONE ) 25 MG tablet Take 0.5 tablets (12.5 mg total) by mouth daily.   08/15/2024  torsemide  (DEMADEX ) 20 MG tablet Take 1 tablet (20 mg total) by mouth daily. Take 2 tablets daily in case of weight gain 2 to 3 lbs in 24 hrs or 5 lbs in 7 days 30 tablet 0 08/15/2024   valACYclovir  (VALTREX ) 1000 MG tablet Take 1 tablet (1,000 mg total) by mouth 2 (two) times daily. 14 tablet 2 Unknown    Inpatient Medications:   amiodarone   200 mg Oral Daily   aspirin   81 mg Oral Daily   Chlorhexidine  Gluconate Cloth  6 each Topical Daily   heparin   5,000 Units Subcutaneous Q8H   insulin  aspart  0-5 Units Subcutaneous QHS   insulin  aspart  0-9 Units Subcutaneous TID WC   lactobacillus  1 g Oral TID WC   levothyroxine   112 mcg Oral Daily   multivitamin with minerals  1 tablet Oral Daily   mouth rinse  15 mL Mouth Rinse 4 times per day   pantoprazole   40 mg Oral QAC breakfast   pravastatin   40 mg Oral QHS   quiNIDine sulfate   200 mg Oral TID   sodium chloride  flush  3 mL Intravenous Q12H   sodium chloride  flush  3 mL Intravenous Q12H    Allergies: Allergies[1]  Family History  Problem Relation Age of Onset   Heart disease Mother    Melanoma Mother    Parkinson's disease Mother    Heart disease Father    Heart disease Brother    Heart failure Other     Breast cancer Other    Hypertension Other      Physical Exam: Vitals:   08/18/24 1130 08/18/24 1145 08/18/24 1200 08/18/24 1215  BP: 106/66 (!) 116/98 (!) 94/57 (!) 97/59  Pulse: 89 89 89 89  Resp: (!) 32 19 (!) 28 (!) 31  Temp:      TempSrc:      SpO2: 98% 98% 98% 97%  Weight:      Height:        GEN- NAD, normal affect HEENT: Normocephalic, L internal jugular central line in place Lungs- CTAB, Normal effort.  Heart- Regular rate and rhythm, No M/G/R.  GI- Soft, NT, ND.  Extremities- No clubbing, cyanosis, or edema   Radiology/Studies: DG CHEST PORT 1 VIEW Result Date: 08/17/2024 CLINICAL DATA:  Central line placement. EXAM: PORTABLE CHEST 1 VIEW COMPARISON:  Radiograph yesterday FINDINGS: Placement of left internal jugular central line. The tip overlies multiple left-sided pacemaker wires in the region of the innominate/SVC confluence. No pneumothorax. Multi lead left-sided pacemaker in place. Stable cardiomegaly. Mild vascular congestion. No large pleural effusion. No focal airspace disease. Right shoulder arthroplasty. IMPRESSION: 1. Placement of left internal jugular central line with tip overlying the innominate/SVC confluence. No pneumothorax. 2. Stable cardiomegaly and vascular congestion. Electronically Signed   By: Andrea Gasman M.D.   On: 08/17/2024 17:54   ECHOCARDIOGRAM COMPLETE Result Date: 08/17/2024    ECHOCARDIOGRAM REPORT   Patient Name:   Tanya Hogan Date of Exam: 08/17/2024 Medical Rec #:  [id number]       Height:       61.0 in Accession #:    [id number]      Weight:       172.5 lb Date of Birth:  [birth year]        BSA:          1.774 m Patient Age:    73 years        BP:  103/65 mmHg Patient Gender: F               HR:           89 bpm. Exam Location:  Inpatient Procedure: 2D Echo (Both Spectral and Color Flow Doppler were utilized during            procedure). Indications:    acute systolic chf  History:        Patient has prior history of  Echocardiogram examinations, most                 recent 05/13/2024. Pacemaker and Defibrillator, Pulmonary HTN and                 Cirrhosis; Risk Factors:Diabetes, Hypertension and Sleep Apnea.  Sonographer:    Tinnie Barefoot RDCS Referring Phys: [id number] [facility] J PATWARDHAN IMPRESSIONS  1. Left ventricular ejection fraction, by estimation, is <20%. The left ventricle has severely decreased function. The left ventricle demonstrates global hypokinesis. The left ventricular internal cavity size was severely dilated. Left ventricular diastolic parameters are consistent with Grade III diastolic dysfunction (restrictive).  2. Right ventricular systolic function is moderately reduced. The right ventricular size is moderately enlarged. There is moderately elevated pulmonary artery systolic pressure. The estimated right ventricular systolic pressure is 48.4 mmHg.  3. Left atrial size was severely dilated.  4. Right atrial size was severely dilated.  5. A small pericardial effusion is present. The pericardial effusion is anterior to the right ventricle. There is no evidence of cardiac tamponade.  6. The mitral valve is normal in structure. Trivial mitral valve regurgitation. No evidence of mitral stenosis.  7. The aortic valve is tricuspid. There is mild calcification of the aortic valve. Aortic valve regurgitation is not visualized. Aortic valve sclerosis/calcification is present, without any evidence of aortic stenosis.  8. The inferior vena cava is dilated in size with <50% respiratory variability, suggesting right atrial pressure of 15 mmHg. FINDINGS  Left Ventricle: Left ventricular ejection fraction, by estimation, is <20%. The left ventricle has severely decreased function. The left ventricle demonstrates global hypokinesis. The left ventricular internal cavity size was severely dilated. There is no left ventricular hypertrophy. Left ventricular diastolic parameters are consistent with Grade III diastolic  dysfunction (restrictive). Right Ventricle: The right ventricular size is moderately enlarged. No increase in right ventricular wall thickness. Right ventricular systolic function is moderately reduced. There is moderately elevated pulmonary artery systolic pressure. The tricuspid  regurgitant velocity is 2.89 m/s, and with an assumed right atrial pressure of 15 mmHg, the estimated right ventricular systolic pressure is 48.4 mmHg. Left Atrium: Left atrial size was severely dilated. Right Atrium: Right atrial size was severely dilated. Pericardium: A small pericardial effusion is present. The pericardial effusion is anterior to the right ventricle. There is no evidence of cardiac tamponade. Mitral Valve: The mitral valve is normal in structure. Trivial mitral valve regurgitation. No evidence of mitral valve stenosis. Tricuspid Valve: The tricuspid valve is normal in structure. Tricuspid valve regurgitation is not demonstrated. No evidence of tricuspid stenosis. Aortic Valve: The aortic valve is tricuspid. There is mild calcification of the aortic valve. Aortic valve regurgitation is not visualized. Aortic valve sclerosis/calcification is present, without any evidence of aortic stenosis. Pulmonic Valve: The pulmonic valve was normal in structure. Pulmonic valve regurgitation is trivial. No evidence of pulmonic stenosis. Aorta: The aortic root is normal in size and structure. Venous: The inferior vena cava is dilated in size with less than 50% respiratory variability,  suggesting right atrial pressure of 15 mmHg. IAS/Shunts: No atrial level shunt detected by color flow Doppler. Additional Comments: A device lead is visualized.  LEFT VENTRICLE PLAX 2D LVOT diam:     2.10 cm      Diastology LV SV:         32           LV e' medial:   9.57 cm/s LV SV Index:   18           LV E/e' medial: 10.1 LVOT Area:     3.46 cm  LV Volumes (MOD) LV vol d, MOD A2C: 266.0 ml LV vol d, MOD A4C: 179.0 ml LV vol s, MOD A2C: 231.0 ml LV vol  s, MOD A4C: 163.0 ml LV SV MOD A2C:     35.0 ml LV SV MOD A4C:     179.0 ml LV SV MOD BP:      31.3 ml RIGHT VENTRICLE          IVC RV Basal diam:  3.40 cm  IVC diam: 2.30 cm TAPSE (M-mode): 2.1 cm LEFT ATRIUM              Index        RIGHT ATRIUM           Index LA Vol (A2C):   116.0 ml 65.40 ml/m  RA Area:     22.80 cm LA Vol (A4C):   71.2 ml  40.14 ml/m  RA Volume:   68.80 ml  38.79 ml/m LA Biplane Vol: 96.8 ml  54.58 ml/m  AORTIC VALVE             PULMONIC VALVE LVOT Vmax:   67.70 cm/s  PR End Diast Vel: 8.18 msec LVOT Vmean:  39.900 cm/s LVOT VTI:    0.092 m  AORTA Ao Root diam: 3.00 cm Ao Asc diam:  3.20 cm MITRAL VALVE               TRICUSPID VALVE MV Area (PHT): 6.17 cm    TR Peak grad:   33.4 mmHg MV Decel Time: 123 msec    TR Vmax:        289.00 cm/s MV E velocity: 96.70 cm/s MV A velocity: 80.50 cm/s  SHUNTS MV E/A ratio:  1.20        Systemic VTI:  0.09 m                            Systemic Diam: 2.10 cm Toribio Fuel MD Electronically signed by Toribio Fuel MD Signature Date/Time: 08/17/2024/4:42:35 PM    Final    DG Chest 2 View Result Date: 08/16/2024 EXAM: 2 VIEW(S) XRAY OF THE CHEST 08/16/2024 07:25:00 AM COMPARISON: 08/14/2024 CLINICAL HISTORY: 73 year old female with shortness of breath. FINDINGS: LINES, TUBES AND DEVICES: Left chest ICD noted. LUNGS AND PLEURA: Progressed pulmonary vascularity. No edema. No confluent lung opacity. No pleural effusion. No pneumothorax. HEART AND MEDIASTINUM: Cardiomegaly. BONES AND SOFT TISSUES: Right shoulder arthroplasty noted. Multilevel degenerative changes of thoracic spine. No acute osseous abnormality. IMPRESSION: 1. No acute cardiopulmonary abnormality. Electronically signed by: Helayne Hurst MD 08/16/2024 07:30 AM EST RP Workstation: [id number]   DG Chest 2 View Result Date: 08/14/2024 CLINICAL DATA:  Cough for 1 week EXAM: CHEST - 2 VIEW COMPARISON:  08/02/2024, CT 07/13/2024, chest x-ray 06/05/2024 FINDINGS: Right shoulder  replacement. Left-sided multi lead pacing device, similar compared to prior. Cardiomegaly without focal airspace  disease, pleural effusion or pneumothorax. IMPRESSION: No active cardiopulmonary disease. Cardiomegaly. Electronically Signed   By: Luke Bun M.D.   On: 08/14/2024 16:59   CUP PACEART REMOTE DEVICE CHECK Result Date: 08/03/2024 ICD: Scheduled remote reviewed. Normal device function.  Presenting rhythm: AP/VP 32 NSVT, V>A, V-rate 125-167 bpm, duration 1-2 sec Next remote transmission per protocol. ML, CVRS  DG Chest 2 View Result Date: 08/02/2024 CLINICAL DATA:  Chest pressure. EXAM: CHEST - 2 VIEW COMPARISON:  Chest radiograph dated 06/05/2024 FINDINGS: There is cardiomegaly with vascular congestion. No focal consolidation, pleural effusion, pneumothorax. Left pectoral AICD device. No acute osseous pathology. Right shoulder arthroplasty. IMPRESSION: Cardiomegaly with vascular congestion. No focal consolidation. Electronically Signed   By: Vanetta Chou M.D.   On: 08/02/2024 20:37    EKG:08/16/2024 -  AV paced (personally reviewed)  TELEMETRY: VP (personally reviewed)  DEVICE HISTORY:  MDT PPM 11/14/01 > upgrade to CRT-P 11/03/21 > further upgraded to CRT-D 05/31/24.  Abandoned LV Lead   Assessment/Plan: #) h/o VT #) HFrEF #) CRT-D in situ #) MRSA bacteremia  Patient with complicated device history as above, s/p recent CRT-P > CRT-D upgrade. Presented to hospital with increasing SOB, bilat edema became further hypotensive. Blood cultures revealed MRSA Pending TEE Initial lead implant 2003 Anticipate further discussion of extraction at [facility] vs transfer to [facility] Needs to medically improve further  MD to see      For questions or updates, please contact [facility] Please consult [email or web address] for contact info under       Signed, Chantal Needle, NP  08/18/2024 12:43 PM          [1]  Allergies Allergen Reactions   Injectafer [Ferric  Carboxymaltose] Shortness Of Breath    Mild SOB following injectafer infusion    Penicillins Anaphylaxis and Rash    Tolerated ceftin  2020   Accupril [Quinapril Hcl] Other (See Comments)    Hyperkalemia=high potassium level   Celebrex [Celecoxib] Other (See Comments)    Hx hematemesis, bleeding ulcer   Cozaar  [Losartan  Potassium] Itching   Dml Forte Rash   Entresto [Sacubitril-Valsartan] Other (See Comments)    Hyperkalemia   Flonase  [Fluticasone ] Other (See Comments)    Hx nasal ulcer   Lactose Intolerance (Gi) Diarrhea   Lipitor [Atorvastatin ] Other (See Comments)    Myalgias    Nasonex  [Mometasone  Furoate] Other (See Comments)    Epistaxis    Nsaids Other (See Comments)    Hx hematemesis, bleeding ulcer   Advil [Ibuprofen] Nausea And Vomiting    Hx hematemesis, bleeding ulcer   Baking Soda-Fluoride [Sodium Fluoride] Itching and Rash   Bayer Aspirin  [Aspirin ] Other (See Comments)    Hx hematemesis, bleeding ulcer   Chamomile Hives   Clindamycin /Lincomycin Rash   Codeine Nausea And Vomiting   Lasix  [Furosemide ] Other (See Comments)    Tinnitus  Hypotension   Miralax  [Polyethylene Glycol (Macrogol)] Other (See Comments)    Leaky gut syndrome   Quinine Rash   Reclast  [Zoledronic  Acid] Rash and Other (See Comments)    Tremors    Rifadin [Rifampin] Other (See Comments)    Unknown reaction   Singulair  [Montelukast ] Other (See Comments)    Dizziness    Sulfa Antibiotics Rash   Ultram  [Tramadol ] Anxiety and Rash   Vesicare  [Solifenacin ] Other (See Comments)    Urinary retention   Wellbutrin  [Bupropion ] Other (See Comments)    Myalgias  Skin crawling sensation   Wool Alcohol  [Lanolin] Rash   "

## 2024-08-18 NOTE — Consult Note (Signed)
 "        [facility]    Date of Admission:  08/16/2024   Total days of inpatient antibiotics 1        Reason for Consult:     Principal Problem:   Acute on chronic congestive heart failure (HCC) Active Problems:   History of left breast cancer   Hypothyroidism   Essential hypertension   Biventricular cardiac pacemaker in situ   Controlled diabetes mellitus type 2 with complications (HCC)   NASH (nonalcoholic steatohepatitis)   Microcytic anemia   Physical deconditioning   DDD (degenerative disc disease), cervical   Ductal carcinoma in situ (DCIS) of right breast   Irritable bowel syndrome with constipation   Osteoporosis   AV block, 3rd degree (HCC)   Gastroesophageal reflux disease   COPD (chronic obstructive pulmonary disease) (HCC)   SVT (supraventricular tachycardia)   Obesity, Class I, BMI 30-34.9   Hyperlipidemia   Leukocytosis   Sepsis (HCC)   Assessment: 73 year old female with past medical history of NICM with heart failure EF 20 to 25% status post defibrillator, last replaced on 06/10/2024, NASH cirrhosis, diabetes type 2, hypertension, OSA on BiPAP, IBS presenting with bilateral lower extremity edema with some shortness of breath admitted with heart failure found to have #MRSA bacteremia in the setting of AICD with recent upgrade on 06/10/2024 #Diabetes mellitus #NASH cirrhosis #Heart failure with reduced ejection fraction - On 10/18 patient recent upgraded of CRT-P to CRT-D,  additional RV lead palced - Chest x-ray on admission showed no acute abnormality, WBC 16K.  Patient afebrile. - On 1225 found to be minimally responsive transferred to ICU.  Central line placed.  Blood cultures(obtained about half an hour after central line placement) from 12/25 grew MRSA.  Infectious disease engaged. - Denies any overt signs/erythema.  Patient denies any new aches or pains.  No tenderness/erythema noted at AICD site Recommendations: -  Continue vancomycin  -Follow blood cultures - Repeat blood culture to ensure clearance - Need to remove lines including central line - TTE on 12/24 no vegetation noted.  Will need TEE - Please inform EP as patient has a defibrillator in the setting of MRSA bacteremia - Contact precautions - Communicated plan with primary - Dr. Overton is covering this weekend. Microbiology:   Antibiotics:   Cultures: Blood 12/25 2 out of 2 MRSA Urine  Other   HPI: LORENZO PEREYRA is a 73 y.o. female with chronic heart failure reduced ejection fraction with a EF 20-25% with nonischemic cardiomyopathy status post defibrillator placement on 06/10/2024, NASH cirrhosis, diabetes type 2, hypertension, OSA on BiPAP, IBS presented with bilateral lower extremity edema admitted under hospitalist service.  She was started on Bumex . Arrival patient afebrile, WBC 16k. On 12/25 found to be minimally responsive with hypotension, requiring BiPAP transferred to the ICU for further management.  ID engaged as blood cultures grew MRSA. X-ray admission showed no acute abnormality. Review of Systems: Review of Systems  All other systems reviewed and are negative.   Past Medical History:  Diagnosis Date   Alkaline phosphatase elevation    Asthma    Breast cancer (HCC) 2011   Carpal tunnel syndrome    bilateral   Chronic HFrEF (heart failure with reduced ejection fraction) (HCC)    Cirrhosis, non-alcoholic (HCC)    Closed fracture of unspecified part of radius (alone)    DDD (degenerative disc disease), lumbar    Depression    pt denies   Diabetes mellitus without complication (  HCC)    Enlarged heart    Fibromyalgia    GERD (gastroesophageal reflux disease)    History of kidney stones    HTN (hypertension)    Hypothyroidism    IBS (irritable bowel syndrome)    LBBB (left bundle branch block)    Microcytic anemia 05/14/2017   Mitral regurgitation    NICM (nonischemic cardiomyopathy) (HCC)    Pneumonia     Presence of permanent cardiac pacemaker    Pulmonary hypertension (HCC)    PVD (peripheral vascular disease)    2019   Sleep apnea    uses cpap   Tricuspid regurgitation    Ventricular tachyarrhythmia (HCC)     Social History[1]  Family History  Problem Relation Age of Onset   Heart disease Mother    Melanoma Mother    Parkinson's disease Mother    Heart disease Father    Heart disease Brother    Heart failure Other    Breast cancer Other    Hypertension Other    Scheduled Meds:  amiodarone   200 mg Oral Daily   aspirin   81 mg Oral Daily   Chlorhexidine  Gluconate Cloth  6 each Topical Daily   heparin   5,000 Units Subcutaneous Q8H   insulin  aspart  0-5 Units Subcutaneous QHS   insulin  aspart  0-9 Units Subcutaneous TID WC   lactobacillus  1 g Oral TID WC   levothyroxine   112 mcg Oral Daily   multivitamin with minerals  1 tablet Oral Daily   mouth rinse  15 mL Mouth Rinse 4 times per day   pantoprazole   40 mg Oral QAC breakfast   pravastatin   40 mg Oral QHS   quiNIDine sulfate   200 mg Oral TID   sodium chloride  flush  3 mL Intravenous Q12H   sodium chloride  flush  3 mL Intravenous Q12H   Continuous Infusions:  norepinephrine  (LEVOPHED ) Adult infusion     vancomycin      PRN Meds:.acetaminophen  **OR** acetaminophen , levalbuterol , ondansetron  **OR** ondansetron  (ZOFRAN ) IV, mouth rinse, oxyCODONE , senna-docusate, traZODone  Allergies[2]  OBJECTIVE: Blood pressure (!) 97/59, pulse 89, temperature 97.8 F (36.6 C), temperature source Axillary, resp. rate (!) 31, height 5' 1 (1.549 m), weight 78.2 kg, SpO2 97%.  Physical Exam Constitutional:      Appearance: Normal appearance.  HENT:     Head: Normocephalic and atraumatic.     Right Ear: Tympanic membrane normal.     Left Ear: Tympanic membrane normal.     Nose: Nose normal.     Mouth/Throat:     Mouth: Mucous membranes are moist.  Eyes:     Extraocular Movements: Extraocular movements intact.      Conjunctiva/sclera: Conjunctivae normal.     Pupils: Pupils are equal, round, and reactive to light.  Cardiovascular:     Rate and Rhythm: Normal rate and regular rhythm.     Heart sounds: No murmur heard.    No friction rub. No gallop.  Pulmonary:     Effort: Pulmonary effort is normal.     Breath sounds: Normal breath sounds.  Abdominal:     General: Abdomen is flat.     Palpations: Abdomen is soft.  Musculoskeletal:        General: Normal range of motion.  Skin:    General: Skin is warm and dry.  Neurological:     General: No focal deficit present.     Mental Status: She is alert and oriented to person, place, and time.  Psychiatric:  Mood and Affect: Mood normal.    Left chest AICD without signs of infection. Lab Results Lab Results  Component Value Date   WBC 14.8 (H) 08/18/2024   HGB 12.4 08/18/2024   HCT 38.4 08/18/2024   MCV 96.2 08/18/2024   PLT 147 (L) 08/18/2024    Lab Results  Component Value Date   CREATININE 0.66 08/18/2024   BUN 16 08/18/2024   NA 135 08/18/2024   K 3.6 08/18/2024   CL 100 08/18/2024   CO2 26 08/18/2024    Lab Results  Component Value Date   ALT 54 (H) 08/18/2024   AST 83 (H) 08/18/2024   ALKPHOS 186 (H) 08/18/2024   BILITOT 1.0 08/18/2024       Loney Stank, MD [facility] 08/18/2024, 2:24 PM     [1]  Social History Tobacco Use   Smoking status: Former    Current packs/day: 0.00    Types: Cigarettes    Quit date: 08/24/1966    Years since quitting: 58.0   Smokeless tobacco: Never  Vaping Use   Vaping status: Never Used  Substance Use Topics   Alcohol  use: No   Drug use: No  [2]  Allergies Allergen Reactions   Injectafer [Ferric Carboxymaltose] Shortness Of Breath    Mild SOB following injectafer infusion    Penicillins Anaphylaxis and Rash    Tolerated ceftin  2020   Accupril [Quinapril Hcl] Other (See Comments)    Hyperkalemia=high potassium  level   Celebrex [Celecoxib] Other (See Comments)    Hx hematemesis, bleeding ulcer   Cozaar  [Losartan  Potassium] Itching   Dml Forte Rash   Entresto [Sacubitril-Valsartan] Other (See Comments)    Hyperkalemia   Flonase  [Fluticasone ] Other (See Comments)    Hx nasal ulcer   Lactose Intolerance (Gi) Diarrhea   Lipitor [Atorvastatin ] Other (See Comments)    Myalgias    Nasonex  [Mometasone  Furoate] Other (See Comments)    Epistaxis    Nsaids Other (See Comments)    Hx hematemesis, bleeding ulcer   Advil [Ibuprofen] Nausea And Vomiting    Hx hematemesis, bleeding ulcer   Baking Soda-Fluoride [Sodium Fluoride] Itching and Rash   Bayer Aspirin  [Aspirin ] Other (See Comments)    Hx hematemesis, bleeding ulcer   Chamomile Hives   Clindamycin /Lincomycin Rash   Codeine Nausea And Vomiting   Lasix  [Furosemide ] Other (See Comments)    Tinnitus  Hypotension   Miralax  [Polyethylene Glycol (Macrogol)] Other (See Comments)    Leaky gut syndrome   Quinine Rash   Reclast  [Zoledronic  Acid] Rash and Other (See Comments)    Tremors    Rifadin [Rifampin] Other (See Comments)    Unknown reaction   Singulair  [Montelukast ] Other (See Comments)    Dizziness    Sulfa Antibiotics Rash   Ultram  [Tramadol ] Anxiety and Rash   Vesicare  [Solifenacin ] Other (See Comments)    Urinary retention   Wellbutrin  [Bupropion ] Other (See Comments)    Myalgias  Skin crawling sensation   Wool Alcohol  [Lanolin] Rash   "

## 2024-08-18 NOTE — Progress Notes (Addendum)
 "    Advanced Heart Failure Rounding Note  Cardiologist: Redell Shallow, MD  [facility] Cardiologist: Dr. Rolan  Chief Complaint: MRSA Bacteremia    Patient Profile   Tanya Hogan is a 73 y.o. female w/ chronic HFrEF s/p CRT, DM2, cirrhosis d/t NASH. Admitted 2 weeks ago for HF. Readmitted 12/24 with recurrent SOB. Transferred to CCU given increased lethargy and respiratory distress/ concern for possible CS vs infectious process (WBC 16, PCT 14). Echo EF < 20%, RV mod reduced, small pericardial effusion.    Significant events:   12/25: PICC placed. Co-ox 64%  12/26: BCx growing MRSA   Subjective:    Continues on IV Vanc.   Co-ox remains stable at 64%. CVP 11-12   Feels better today, breathing improved. Mentation improving, oriented to person and place but not time.  Denies subjective fever and chills    Objective:   Weight Range: 78.2 kg Body mass index is 32.59 kg/m.   Vital Signs:   Temp:  [97.8 F (36.6 C)-99.3 F (37.4 C)] 97.8 F (36.6 C) (12/26 0700) Pulse Rate:  [87-95] 89 (12/26 0700) Resp:  [18-46] 28 (12/26 0700) BP: (82-114)/(50-77) 108/68 (12/26 0700) SpO2:  [89 %-100 %] 96 % (12/26 0700) FiO2 (%):  [28 %] 28 % (12/26 0000) Last BM Date : 08/17/24  Weight change: Filed Weights   08/16/24 1545 08/17/24 0320  Weight: 78.5 kg 78.2 kg    Intake/Output:   Intake/Output Summary (Last 24 hours) at 08/18/2024 0929 Last data filed at 08/18/2024 0700 Gross per 24 hour  Intake 800 ml  Output 400 ml  Net 400 ml     Physical Exam   CVP 11-12  General:  fatigued appearing. NAD  Neck: LIJ CVC  Cor: Regular rate & rhythm. No MRG, JVD 12 cm  Lungs: course bilaterally  Extremities: trace b/l LEE edema   Telemetry   AV paced 90s, personally reviewed   Labs   CBC Recent Labs    08/17/24 1745 08/18/24 0557  WBC 14.7* 14.8*  HGB 12.7 12.4  HCT 38.6 38.4  MCV 93.7 96.2  PLT 165 147*   Basic Metabolic Panel Recent Labs    87/75/74 1749  08/17/24 0449 08/17/24 1506 08/17/24 1745 08/18/24 0557  NA  --    < > 133* 132* 135  K  --    < > 3.6 3.6 3.6  CL  --    < > 95* 94* 100  CO2  --    < > 25 26 26   GLUCOSE  --    < > 124* 115* 123*  BUN  --    < > 19 18 16   CREATININE  --    < > 0.93 0.83 0.66  CALCIUM   --    < > 9.1 8.8* 8.9  MG 1.8  --  1.9 2.8*  --   PHOS 2.2*  --   --   --   --    < > = values in this interval not displayed.   Liver Function Tests Recent Labs    08/17/24 1506 08/18/24 0557  AST 87* 83*  ALT 57* 54*  ALKPHOS 201* 186*  BILITOT 1.1 1.0  PROT 6.8 6.3*  ALBUMIN 3.3* 3.0*   No results for input(s): LIPASE, AMYLASE in the last 72 hours. Cardiac Enzymes No results for input(s): CKTOTAL, CKMB, CKMBINDEX, TROPONINI in the last 72 hours.  BNP: BNP (last 3 results) Recent Labs    06/05/24 1024 07/13/24 1430  08/02/24 1942  BNP 1,051.8* 1,523.2* 1,744.7*    ProBNP (last 3 results) Recent Labs    08/16/24 0707 08/17/24 0449 08/18/24 0557  PROBNP 27,035.0* 22,372.0* 10,332.0*     D-Dimer No results for input(s): DDIMER in the last 72 hours. Hemoglobin A1C Recent Labs    08/17/24 0809  HGBA1C 5.9*   Fasting Lipid Panel No results for input(s): CHOL, HDL, LDLCALC, TRIG, CHOLHDL, LDLDIRECT in the last 72 hours. Medications:   Scheduled Medications:  amiodarone   200 mg Oral Daily   aspirin   81 mg Oral Daily   Chlorhexidine  Gluconate Cloth  6 each Topical Daily   heparin   5,000 Units Subcutaneous Q8H   insulin  aspart  0-5 Units Subcutaneous QHS   insulin  aspart  0-9 Units Subcutaneous TID WC   lactobacillus  1 g Oral TID WC   levothyroxine   112 mcg Oral Daily   multivitamin with minerals  1 tablet Oral Daily   mouth rinse  15 mL Mouth Rinse 4 times per day   pantoprazole   40 mg Oral QAC breakfast   pravastatin   40 mg Oral QHS   quiNIDine sulfate   200 mg Oral TID   sodium chloride  flush  3 mL Intravenous Q12H   sodium chloride  flush  3 mL  Intravenous Q12H    Infusions:  norepinephrine  (LEVOPHED ) Adult infusion     vancomycin       PRN Medications: acetaminophen  **OR** acetaminophen , levalbuterol , ondansetron  **OR** ondansetron  (ZOFRAN ) IV, mouth rinse, oxyCODONE , senna-docusate, traZODone   Assessment/Plan   MRSA Bacteremia/ Suspected PNA   - source suspected to be PNA  - on IV vanc  - ID consulted - no clear vegetations nor significant valvular dfx noted on TTE  - will need TEE  - has CRT-P. Will notify EP   2. Acute on Chronic Systolic Heart Failure  - long standing CM - NICM, LHC 2021 normal cors  - Echo this admit EF < 20%, RV mod reduced, IVC dilated, assumed RAP ~15. Prior echo EF 20-25%  - Co-ox ok, 64%. No indication for inotrope currently  - CVP 11-12 . Start gentle diuresis, give 40 mg IV Lasix  x1 and follow response  - BP currently too soft for GDMT   3. H/o VT - admitted 9/25, in setting of prolonged QT (had been on cipro  for diverticulitis) - rhythm stable on tele - continue PO amio + quinidine  - keep K > 4.0 and Mg > 2.0    CRITICAL CARE Performed by: Caffie Shed   Total critical care time: 15 minutes  Critical care time was exclusive of separately billable procedures and treating other patients.  Critical care was necessary to treat or prevent imminent or life-threatening deterioration.  Critical care was time spent personally by me on the following activities: development of treatment plan with patient and/or surrogate as well as nursing, discussions with consultants, evaluation of patient's response to treatment, examination of patient, obtaining history from patient or surrogate, ordering and performing treatments and interventions, ordering and review of laboratory studies, ordering and review of radiographic studies, pulse oximetry and re-evaluation of patient's condition.   Length of Stay: 2  Caffie Shed, PA-C  08/18/2024, 9:29 AM  Advanced Heart Failure Team Pager  [phone] (M-F; 7a - 5p)   Please visit [email or web address]: For overnight coverage please call cardiology fellow first. If fellow not available call Shock/ECMO MD on call.  For ECMO / Mechanical Support (Impella, IABP, LVAD) issues call Shock / ECMO MD on call.   Agree with  above.   Remains on NE. Bcx with MRSA.  Denies SOB.   Co-ox 64% CVP 11-12.   Seen by ID. Now on abx  General:  Sitting up in chair. No resp difficulty HEENT: normal Neck: supple. JVP to jaw  Cor: Regular rate & rhythm. No rubs, gallops or murmurs. Lungs: clear Abdomen: soft, nontender, nondistended.Good bowel sounds. Extremities: no cyanosis, clubbing, rash, edema Neuro: alert & orientedx3, cranial nerves grossly intact. moves all 4 extremities w/o difficulty. Affect pleasant  Wean NE as tolerated. Continue abx will need TEE. I reached out to EP to see her to consider lead extraction when more stable. Volume status ok.   CRITICAL CARE Performed by: Cherrie Sieving  Total critical care time: 40 minutes  Critical care time was exclusive of separately billable procedures and treating other patients.  Critical care was necessary to treat or prevent imminent or life-threatening deterioration.  Critical care was time spent personally by me (independent of midlevel providers or residents) on the following activities: development of treatment plan with patient and/or surrogate as well as nursing, discussions with consultants, evaluation of patient's response to treatment, examination of patient, obtaining history from patient or surrogate, ordering and performing treatments and interventions, ordering and review of laboratory studies, ordering and review of radiographic studies, pulse oximetry and re-evaluation of patient's condition.  Sieving Cherrie, MD  5:32 PM     "

## 2024-08-18 NOTE — Plan of Care (Signed)
 Patient with transfer orders to Telemetry. Progressing well. No longer on Levophed  drip. Does have orthostatic hypotension when getting OOB to chair but does come back up afterwards.

## 2024-08-18 NOTE — TOC Initial Note (Addendum)
 Transition of Care [facility]) - Initial/Assessment Note    Patient Details  Name: Tanya Hogan MRN: [id number] Date of Birth: [birth year]  [facility] of [facility]) CM/SW Contact:    Tanya Hogan Phone Number: [phone] 08/18/2024, 2:08 PM  Clinical Narrative:     HF CSW met with patient at bedside. Patient stated that she lives with her brother. Patient stated that her brother has Parkinson's disease, and she takes care of him. Patient stated that she has a history of HH services using HHPT and HHRN. Patient stated that she uses a Mar[facility]a and bipap. Patient stated that they have a scale at home. Patient stated that she has a PCP. CSW explained that a hospital follow up appointment is typically scheduled closer towards dc. Patient is agreeable. Patient stated that her daughter will provide transportation at dc. Patient gave permission for CSW to contact her daughter. Patient also wants daughter to be her HCPOA.   2:11 PM:  Called the patients daughter and left a VM. Will have consult placed once I make contact with her daughter.    HF CSW received a call back from the patients daughter. Patients daughter is agreeable with being HCPOA. CSW explained the process. Patients daughter stated that she is available tomorrow between 2-4 PM. CSW notified the HF medical team to place consult via secure chat.   HF CSW/CM will continue to follow and monitor for dc readiness.                     Patient Goals and CMS Choice            Expected Discharge Plan and Services                                              Prior Living Arrangements/Services                       Activities of Daily Living   ADL Screening (condition at time of admission) Independently performs ADLs?: Yes (appropriate for developmental age) Is the patient deaf or have difficulty hearing?: No Does the patient have difficulty seeing, even when wearing glasses/contacts?: No Does the  patient have difficulty concentrating, remembering, or making decisions?: No  Permission Sought/Granted                  Emotional Assessment              Admission diagnosis:  Acute on chronic congestive heart failure with left ventricular diastolic dysfunction (HCC) [I50.33] Patient Active Problem List   Diagnosis Date Noted   Sepsis (HCC) 08/17/2024   Acute on chronic congestive heart failure (HCC) 08/16/2024   Hyperlipidemia 08/16/2024   Leukocytosis 08/16/2024   Phlebitis 08/08/2024   History of ventricular tachycardia 08/03/2024   Obesity, Class I, BMI 30-34.9 08/03/2024   Moderate episode of recurrent major depressive disorder (HCC) 07/24/2024   Hordeolum externum of right eye 07/15/2024   Ventricular tachycardia (HCC) 05/28/2024   SVT (supraventricular tachycardia) 05/13/2024   Sensation as if bladder still full 11/23/2023   Fracture of base of fifth metatarsal bone of left foot 11/02/2023   Presence of heart assist device (HCC) 10/07/2023   Primary osteoarthritis of left shoulder 09/08/2023   Nonischemic cardiomyopathy (HCC) 02/16/2023   Stress and adjustment reaction 04/08/2022   Gastroesophageal reflux disease  02/16/2022   AV block, 3rd degree (HCC) 10/24/2021   COPD (chronic obstructive pulmonary disease) (HCC) 09/21/2021   Osteoporosis 02/28/2021   Primary osteoarthritis of both knees 01/14/2021   Irritable bowel syndrome with constipation 09/16/2020   Aortic atherosclerosis 06/11/2020   Benign lipomatous neoplasm of skin and subcutaneous tissue of left leg 06/11/2020   NSVT (nonsustained ventricular tachycardia) (HCC) 02/14/2020   Genetic testing 08/14/2019   Ductal carcinoma in situ (DCIS) of right breast 06/15/2019   Arthralgia 04/11/2019   DDD (degenerative disc disease), cervical 11/19/2017   Peripheral edema 05/19/2017   Microcytic anemia 05/14/2017   Delayed sleep phase syndrome 10/29/2016   Primary osteoarthritis of left ankle 09/24/2016    History of arthroplasty of right shoulder 02/24/2016   Bilateral foot pain 01/22/2016   NASH (nonalcoholic steatohepatitis) 05/26/2015    Class: Stage 3   Nasal septal perforation 07/11/2014   Controlled diabetes mellitus type 2 with complications (HCC) 05/25/2014   Physical deconditioning 04/24/2014   OSA (obstructive sleep apnea) 06/08/2013   Chronic systolic heart failure (HCC) 05/23/2013   Asthma, moderate persistent 04/18/2013   Chronic cough 04/18/2013   History of left breast cancer 03/18/2010   Type 2 diabetes mellitus with hyperlipidemia (HCC) 11/07/2009   Fibromyalgia 10/29/2009   Biventricular cardiac pacemaker in situ 10/23/2009   Hypothyroidism 10/03/2009   Essential hypertension 07/02/2009   Chronic back pain 07/02/2009   PCP:  Tanya Dorothyann BIRCH, MD Pharmacy:   [facility] [phone] - [place], [place] - [address] AT [facility] [address] [place] [address] Phone: [phone] Fax: [phone]  [facility]) - [place], [place] - [address] [facility] [address] [place] [address] [place] [address] Phone: [phone] Fax: [phone]  [facility] - [place], [place] - [address] [place] [address] Phone: [phone] Fax: [phone]     Social Drivers of Health (SDOH) Social History: SDOH Screenings   Food Insecurity: No Food Insecurity (08/16/2024)  Housing: Low Risk (08/16/2024)  Transportation Needs: No Transportation Needs (08/16/2024)  Recent Concern: Transportation Needs - Unmet Transportation Needs (08/09/2024)  Utilities: Not At Risk (08/16/2024)  Recent Concern: Utilities - At Risk (07/07/2024)   Received from [facility]  Alcohol  Screen: Low Risk (08/10/2023)  Depression (PHQ2-9): Low Risk (08/14/2024)  Financial Resource Strain: Low Risk (07/07/2024)   Received from [facility]  Physical Activity:  Sufficiently Active (08/10/2023)  Recent Concern: Physical Activity - Insufficiently Active (07/12/2023)  Social Connections: Moderately Integrated (08/16/2024)  Stress: No Stress Concern Present (06/22/2024)   Received from [facility]  Tobacco Use: Medium Risk (08/16/2024)  Health Literacy: Adequate Health Literacy (08/10/2023)   SDOH Interventions:     Readmission Risk Interventions    08/04/2024    1:33 PM 07/14/2024   11:11 AM  Readmission Risk Prevention Plan  Transportation Screening Complete Complete  PCP or Specialist Appt within 3-5 Days Complete Complete  HRI or Home Care Consult Complete Complete  Palliative Care Screening Not Applicable Not Applicable  Medication Review (RN Care Manager) Complete Complete

## 2024-08-19 ENCOUNTER — Other Ambulatory Visit (HOSPITAL_COMMUNITY): Payer: Self-pay

## 2024-08-19 ENCOUNTER — Telehealth: Payer: Self-pay | Admitting: Student in an Organized Health Care Education/Training Program

## 2024-08-19 DIAGNOSIS — K219 Gastro-esophageal reflux disease without esophagitis: Secondary | ICD-10-CM | POA: Diagnosis not present

## 2024-08-19 DIAGNOSIS — J439 Emphysema, unspecified: Secondary | ICD-10-CM | POA: Diagnosis not present

## 2024-08-19 DIAGNOSIS — R7881 Bacteremia: Secondary | ICD-10-CM

## 2024-08-19 DIAGNOSIS — I10 Essential (primary) hypertension: Secondary | ICD-10-CM

## 2024-08-19 DIAGNOSIS — I442 Atrioventricular block, complete: Secondary | ICD-10-CM | POA: Diagnosis not present

## 2024-08-19 DIAGNOSIS — B9562 Methicillin resistant Staphylococcus aureus infection as the cause of diseases classified elsewhere: Secondary | ICD-10-CM

## 2024-08-19 DIAGNOSIS — K7581 Nonalcoholic steatohepatitis (NASH): Secondary | ICD-10-CM | POA: Diagnosis not present

## 2024-08-19 DIAGNOSIS — Z950 Presence of cardiac pacemaker: Secondary | ICD-10-CM | POA: Diagnosis not present

## 2024-08-19 DIAGNOSIS — I472 Ventricular tachycardia, unspecified: Secondary | ICD-10-CM | POA: Diagnosis not present

## 2024-08-19 DIAGNOSIS — A4102 Sepsis due to Methicillin resistant Staphylococcus aureus: Secondary | ICD-10-CM | POA: Diagnosis not present

## 2024-08-19 DIAGNOSIS — E785 Hyperlipidemia, unspecified: Secondary | ICD-10-CM | POA: Diagnosis not present

## 2024-08-19 DIAGNOSIS — I5023 Acute on chronic systolic (congestive) heart failure: Secondary | ICD-10-CM

## 2024-08-19 DIAGNOSIS — E1169 Type 2 diabetes mellitus with other specified complication: Secondary | ICD-10-CM

## 2024-08-19 LAB — CBC
HCT: 39.5 % (ref 36.0–46.0)
Hemoglobin: 12.7 g/dL (ref 12.0–15.0)
MCH: 30.2 pg (ref 26.0–34.0)
MCHC: 32.2 g/dL (ref 30.0–36.0)
MCV: 93.8 fL (ref 80.0–100.0)
Platelets: 157 K/uL (ref 150–400)
RBC: 4.21 MIL/uL (ref 3.87–5.11)
RDW: 14.5 % (ref 11.5–15.5)
WBC: 13.2 K/uL — ABNORMAL HIGH (ref 4.0–10.5)
nRBC: 0 % (ref 0.0–0.2)

## 2024-08-19 LAB — COMPREHENSIVE METABOLIC PANEL WITH GFR
ALT: 82 U/L — ABNORMAL HIGH (ref 0–44)
AST: 133 U/L — ABNORMAL HIGH (ref 15–41)
Albumin: 2.9 g/dL — ABNORMAL LOW (ref 3.5–5.0)
Alkaline Phosphatase: 204 U/L — ABNORMAL HIGH (ref 38–126)
Anion gap: 9 (ref 5–15)
BUN: 17 mg/dL (ref 8–23)
CO2: 22 mmol/L (ref 22–32)
Calcium: 9.4 mg/dL (ref 8.9–10.3)
Chloride: 98 mmol/L (ref 98–111)
Creatinine, Ser: 0.59 mg/dL (ref 0.44–1.00)
GFR, Estimated: >60 mL/min
Glucose, Bld: 110 mg/dL — ABNORMAL HIGH (ref 70–99)
Potassium: 4.5 mmol/L (ref 3.5–5.1)
Sodium: 129 mmol/L — ABNORMAL LOW (ref 135–145)
Total Bilirubin: 1.1 mg/dL (ref 0.0–1.2)
Total Protein: 6.4 g/dL — ABNORMAL LOW (ref 6.5–8.1)

## 2024-08-19 LAB — GLUCOSE, CAPILLARY
Glucose-Capillary: 110 mg/dL — ABNORMAL HIGH (ref 70–99)
Glucose-Capillary: 107 mg/dL — ABNORMAL HIGH (ref 70–99)
Glucose-Capillary: 130 mg/dL — ABNORMAL HIGH (ref 70–99)
Glucose-Capillary: 160 mg/dL — ABNORMAL HIGH (ref 70–99)

## 2024-08-19 LAB — MAGNESIUM: Magnesium: 2.2 mg/dL (ref 1.7–2.4)

## 2024-08-19 MED ORDER — BUMETANIDE 0.25 MG/ML IJ SOLN
1.0000 mg | Freq: Once | INTRAMUSCULAR | Status: AC
  Administered 2024-08-19: 1 mg via INTRAVENOUS
  Filled 2024-08-19: qty 4

## 2024-08-19 MED ORDER — FUROSEMIDE 10 MG/ML IJ SOLN
40.0000 mg | Freq: Once | INTRAMUSCULAR | Status: DC
  Filled 2024-08-19: qty 4

## 2024-08-19 NOTE — Assessment & Plan Note (Signed)
Continue glucose cover and monitoring with insulin sliding scale.  Continue with statin therapy.

## 2024-08-19 NOTE — Progress Notes (Signed)
" °   08/19/24 0940  Spiritual Encounters  Type of Visit Initial  Care provided to: Patient  Reason for visit Advance directives  OnCall Visit Yes    Chaplain responded to spiritual care consult for advance directives.   Paperwork and information provided and all questions answered at this time. Pt Tanya Hogan stated she will look over the documents in more detail when her daughter arrives around 2:00pm today, and is aware that chaplains remain available as needs arise. "

## 2024-08-19 NOTE — Progress Notes (Signed)
 " Electrophysiology Consultation:   Patient ID: Tanya Hogan MRN: [id number]; DOB: [birth year]  Admit date: 08/16/2024 Date of Consult: 08/19/2024  Primary Care Provider: [name], MD [facility] Cardiologist: Redell Shallow, MD  [facility] Electrophysiologist:  [name], MD   Patient Profile:   Tanya Hogan is a 73 y.o. female with a NICM/CHB, HFrEF likely in part 2/2 pacing-induced CM s/p LEFT MDT CRTP (RA/RV/CS-AIV lead) with subsequent addition of RV-LBaP lead (11/03/21) with subsequent addition of RV-d lead (05/31/24) for ATP for refractory VT, DM2, NASH cirrhosis who presented with lethargy and respiratory distress and is now found to have 4/4 cultures with MRSA.   History of Present Illness:   Ms. Tanya Hogan has a long history of NICM with CHB.  Her initial device was implanted in 2003 with subsequent upgrade in 2023 and recent upgrade on 05/31/2024.  Prior to her last upgrade to CRT-D from CRT-P we had a long discussion regarding different options for management including complete hardware removal and reimplant.  I discussed this with the lead management team at [facility] as well.  She was extremely deconditioned during her initial hospitalization and it was not thought that she will be a sternotomy candidate.  With refractory VT she had addition of an RV-ICD lead to allow for ATP.  She successfully terminated multiple episodes with ATP and did not require any ICD shock but continued to have intermittent episodes for which she was started on quinidine in addition to her amiodarone  following ICD placement.  We discussed the risk of this with her baseline QTc being prolonged and the increased risk of combination antiarrhythmic medications for TDP.  Now she presents back with MRSA bacteremia.  Past Medical History:  Diagnosis Date   Alkaline phosphatase elevation    Asthma    Breast cancer (HCC) 2011   Carpal tunnel syndrome    bilateral   Chronic HFrEF (heart  failure with reduced ejection fraction) (HCC)    Cirrhosis, non-alcoholic (HCC)    Closed fracture of unspecified part of radius (alone)    DDD (degenerative disc disease), lumbar    Depression    pt denies   Diabetes mellitus without complication (HCC)    Enlarged heart    Fibromyalgia    GERD (gastroesophageal reflux disease)    History of kidney stones    HTN (hypertension)    Hypothyroidism    IBS (irritable bowel syndrome)    LBBB (left bundle branch block)    Microcytic anemia 05/14/2017   Mitral regurgitation    NICM (nonischemic cardiomyopathy) (HCC)    Pneumonia    Presence of permanent cardiac pacemaker    Pulmonary hypertension (HCC)    PVD (peripheral vascular disease)    2019   Sleep apnea    uses cpap   Tricuspid regurgitation    Ventricular tachyarrhythmia [facility])    Past Surgical History:  Procedure Laterality Date   BIV ICD GENERATOR CHANGEOUT N/A 05/31/2024   Procedure: BIV ICD GENERATOR CHANGEOUT;  Surgeon: Primus Donnice DELENA, MD;  Location: [facility] INVASIVE CV LAB;  Service: Cardiovascular;  Laterality: N/A;   BIV PACEMAKER GENERATOR CHANGEOUT N/A 11/03/2021   Procedure: BIV PACEMAKER GENERATOR CHANGEOUT;  Surgeon: Waddell Danelle ORN, MD;  Location: [facility] INVASIVE CV LAB;  Service: Cardiovascular;  Laterality: N/A;   BREAST LUMPECTOMY Left 2011   COLONOSCOPY     FOOT SURGERY     KNEE SURGERY     LEAD INSERTION N/A 05/31/2024   Procedure: LEAD INSERTION;  Surgeon: Almetta Donnice LABOR, MD;  Location: [facility] INVASIVE CV LAB;  Service: Cardiovascular;  Laterality: N/A;   LEAD REVISION/REPAIR N/A 11/03/2021   Procedure: LEAD REVISION/REPAIR;  Surgeon: Waddell Danelle ORN, MD;  Location: [facility] INVASIVE CV LAB;  Service: Cardiovascular;  Laterality: N/A;   MASTECTOMY Right    05/2019   PACEMAKER GENERATOR CHANGE N/A 08/23/2014   Procedure: PACEMAKER GENERATOR CHANGE;  Surgeon: Danelle ORN Waddell, MD;  Location: [facility] CATH LAB;  Service: Cardiovascular;  Laterality: N/A;   PACEMAKER PLACEMENT   2009   [facility]   RECTAL SURGERY  1976   REDUCTION MAMMAPLASTY Left    05/2019   RIGHT/LEFT HEART CATH AND CORONARY ANGIOGRAPHY N/A 05/18/2024   Procedure: RIGHT/LEFT HEART CATH AND CORONARY ANGIOGRAPHY;  Surgeon: Wendel Lurena POUR, MD;  Location: [facility] INVASIVE CV LAB;  Service: Cardiovascular;  Laterality: N/A;   TOTAL SHOULDER ARTHROPLASTY Right 03/10/2018   TOTAL SHOULDER ARTHROPLASTY Right 03/10/2018   Procedure: RIGHT TOTAL SHOULDER ARTHROPLASTY;  Surgeon: Dozier Soulier, MD;  Location: [facility] OR;  Service: Orthopedics;  Laterality: Right;   TUBAL LIGATION     UPPER EXTREMITY VENOGRAPHY  05/18/2024   Procedure: UPPER EXTREMITY VENOGRAPHY;  Surgeon: Wendel Lurena POUR, MD;  Location: [facility] INVASIVE CV LAB;  Service: Cardiovascular;;   Inpatient Medications: Scheduled Meds:  amiodarone   200 mg Oral Daily   aspirin   81 mg Oral Daily   Chlorhexidine  Gluconate Cloth  6 each Topical Daily   heparin   5,000 Units Subcutaneous Q8H   insulin  aspart  0-5 Units Subcutaneous QHS   insulin  aspart  0-9 Units Subcutaneous TID WC   lactobacillus  1 g Oral TID WC   levothyroxine   112 mcg Oral Daily   multivitamin with minerals  1 tablet Oral Daily   mouth rinse  15 mL Mouth Rinse 4 times per day   pantoprazole   40 mg Oral QAC breakfast   pravastatin   40 mg Oral QHS   quiNIDine sulfate   200 mg Oral TID   sodium chloride  flush  3 mL Intravenous Q12H   sodium chloride  flush  3 mL Intravenous Q12H   Continuous Infusions:  vancomycin  Stopped (08/18/24 1850)   PRN Meds: acetaminophen  **OR** acetaminophen , levalbuterol , ondansetron  **OR** ondansetron  (ZOFRAN ) IV, mouth rinse, oxyCODONE , senna-docusate, traZODone   Allergies:   Allergies[1]  Social History:   Social History   Socioeconomic History   Marital status: Widowed    Spouse name: Not on file   Number of children: 2   Years of education: 14   Highest education level: Associate degree: academic program  Occupational History    Occupation: quality insurcance in sports coach    Comment: retired  Tobacco Use   Smoking status: Former    Current packs/day: 0.00    Types: Cigarettes    Quit date: 08/24/1966    Years since quitting: 58.0   Smokeless tobacco: Never  Vaping Use   Vaping status: Never Used  Substance and Sexual Activity   Alcohol  use: No   Drug use: No   Sexual activity: Not Currently  Other Topics Concern   Not on file  Social History Narrative   Retired. Lives with and helps her brother. Likes risk manager, nutritional therapist and music. Loves animals.   Social Drivers of Health   Tobacco Use: Medium Risk (08/16/2024)   Patient History    Smoking Tobacco Use: Former    Smokeless Tobacco Use: Never    Passive Exposure: Not on file  Financial Resource Strain: Low Risk (07/07/2024)  Received from [facility]   Overall Financial Resource Strain (CARDIA)    How hard is it for you to pay for the very basics like food, housing, medical care, and heating?: Not hard at all  Food Insecurity: No Food Insecurity (08/16/2024)   Epic    Worried About Programme Researcher, Broadcasting/film/video in the Last Year: Never true    Ran Out of Food in the Last Year: Never true  Transportation Needs: No Transportation Needs (08/16/2024)   Epic    Lack of Transportation (Medical): No    Lack of Transportation (Non-Medical): No  Recent Concern: Transportation Needs - Unmet Transportation Needs (08/09/2024)   Epic    Lack of Transportation (Medical): Yes    Lack of Transportation (Non-Medical): Yes  Physical Activity: Sufficiently Active (08/10/2023)   Exercise Vital Sign    Days of Exercise per Week: 3 days    Minutes of Exercise per Session: 50 min  Recent Concern: Physical Activity - Insufficiently Active (07/12/2023)   Exercise Vital Sign    Days of Exercise per Week: 1 day    Minutes of Exercise per Session: 10 min  Stress: No Stress Concern Present (06/22/2024)   Received from [facility] of  Occupational Health - Occupational Stress Questionnaire    Do you feel stress - tense, restless, nervous, or anxious, or unable to sleep at night because your mind is troubled all the time - these days?: Not at all  Social Connections: Moderately Integrated (08/16/2024)   Social Connection and Isolation Panel    Frequency of Communication with Friends and Family: More than three times a week    Frequency of Social Gatherings with Friends and Family: More than three times a week    Attends Religious Services: More than 4 times per year    Active Member of [employer] or Organizations: Yes    Attends Banker Meetings: 1 to 4 times per year    Marital Status: Widowed  Intimate Partner Violence: Not At Risk (08/16/2024)   Epic    Fear of Current or Ex-Partner: No    Emotionally Abused: No    Physically Abused: No    Sexually Abused: No  Depression (PHQ2-9): Low Risk (08/14/2024)   Depression (PHQ2-9)    PHQ-2 Score: 4  Alcohol  Screen: Low Risk (08/10/2023)   Alcohol  Screen    Last Alcohol  Screening Score (AUDIT): 0  Housing: Low Risk (08/16/2024)   Epic    Unable to Pay for Housing in the Last Year: No    Number of Times Moved in the Last Year: 0    Homeless in the Last Year: No  Utilities: Not At Risk (08/16/2024)   Epic    Threatened with loss of utilities: No  Recent Concern: Utilities - At Risk (07/07/2024)   Received from [facility]    In the past 12 months has the electric, gas, oil, or water  company threatened to shut off services in your home?: Yes  Health Literacy: Adequate Health Literacy (08/10/2023)   B1300 Health Literacy    Frequency of need for help with medical instructions: Never    Family History:    Family History  Problem Relation Age of Onset   Heart disease Mother    Melanoma Mother    Parkinson's disease Mother    Heart disease Father    Heart disease Brother    Heart failure Other    Breast cancer Other    Hypertension  Other      ROS:  Review of Systems: [y] = yes, [ ]  = no      General: Weight gain [ ] ; Weight loss [ ] ; Anorexia [ ] ; Fatigue [ ] ; Fever [ ] ; Chills [ ] ; Weakness [ ]    Cardiac: Chest pain/pressure [ ] ; Resting SOB [ ] ; Exertional SOB [ ] ; Orthopnea [ ] ; Pedal Edema [ ] ; Palpitations [ ] ; Syncope [ ] ; Presyncope [ ] ; Paroxysmal nocturnal dyspnea [ ]    Pulmonary: Cough [ ] ; Wheezing [ ] ; Hemoptysis [ ] ; Sputum [ ] ; Snoring [ ]    GI: Vomiting [ ] ; Dysphagia [ ] ; Melena [ ] ; Hematochezia [ ] ; Heartburn [ ] ; Abdominal pain [ ] ; Constipation [ ] ; Diarrhea [ ] ; BRBPR [ ]    GU: Hematuria [ ] ; Dysuria [ ] ; Nocturia [ ]  Vascular: Pain in legs with walking [ ] ; Pain in feet with lying flat [ ] ; Non-healing sores [ ] ; Stroke [ ] ; TIA [ ] ; Slurred speech [ ] ;   Neuro: Headaches [ ] ; Vertigo [ ] ; Seizures [ ] ; Paresthesias [ ] ;Blurred vision [ ] ; Diplopia [ ] ; Vision changes [ ]    Ortho/Skin: Arthritis [ ] ; Joint pain [ ] ; Muscle pain [ ] ; Joint swelling [ ] ; Back Pain [ ] ; Rash [ ]    Psych: Depression [ ] ; Anxiety [ ]    Heme: Bleeding problems [ ] ; Clotting disorders [ ] ; Anemia [ ]    Endocrine: Diabetes [ ] ; Thyroid  dysfunction [ ]    Physical Exam/Data:   Vitals:   08/19/24 0406 08/19/24 0415 08/19/24 0521 08/19/24 1145  BP:    104/64  Pulse:  89  90  Resp:  (!) 23  (!) 22  Temp:  (!) 95.9 F (35.5 C) 97.7 F (36.5 C) 98 F (36.7 C)  TempSrc:  Rectal Oral Oral  SpO2:  94%  95%  Weight: 80.9 kg     Height:       Intake/Output Summary (Last 24 hours) at 08/19/2024 1530 Last data filed at 08/18/2024 2327 Gross per 24 hour  Intake 331.31 ml  Output 800 ml  Net -468.69 ml      08/19/2024    4:06 AM 08/18/2024    9:40 PM 08/17/2024    3:20 AM  Last 3 Weights  Weight (lbs) 178 lb 5.6 oz 176 lb 9.4 oz 172 lb 8 oz  Weight (kg) 80.9 kg 80.1 kg 78.245 kg     Body mass index is 33.7 kg/m.  General: fatigued but conversant, tearful affect HEENT: normal Lymph: no adenopathy Neck: no JVD Cardiac:   normal S1, S2; RRR; no murmur  Lungs:  clear to auscultation bilaterally, no wheezing, rhonchi or rales  Abd: soft, nontender, no hepatomegaly  Ext: no edema Musculoskeletal:  No deformities, BUE and BLE strength normal and equal Skin: warm and dry, LEFT infraclavicular incision well healed, no erythema, inflammation or swelling Neuro:  CNs 2-12 intact, no focal abnormalities noted Psych:  Normal affect   EKG:  The EKG (08/16/24) was personally reviewed and demonstrates: A-BVP 90, PR 186, QRS 152, QT/c 444/543, occasional PVCs Telemetry:  Telemetry was personally reviewed and demonstrates: AS-BVP 90 with frequent PVCs  Relevant CV Studies:  TTE Result date: 08/17/24  1. Left ventricular ejection fraction, by estimation, is <20%. The left  ventricle has severely decreased function. The left ventricle demonstrates  global hypokinesis. The left ventricular internal cavity size was severely  dilated. Left ventricular  diastolic parameters are consistent with Grade  III diastolic dysfunction  (restrictive).   2. Right ventricular systolic function is moderately reduced. The right  ventricular size is moderately enlarged. There is moderately elevated  pulmonary artery systolic pressure. The estimated right ventricular  systolic pressure is 48.4 mmHg.   3. Left atrial size was severely dilated.   4. Right atrial size was severely dilated.   5. A small pericardial effusion is present. The pericardial effusion is  anterior to the right ventricle. There is no evidence of cardiac  tamponade.   6. The mitral valve is normal in structure. Trivial mitral valve  regurgitation. No evidence of mitral stenosis.   7. The aortic valve is tricuspid. There is mild calcification of the  aortic valve. Aortic valve regurgitation is not visualized. Aortic valve  sclerosis/calcification is present, without any evidence of aortic  stenosis.   8. The inferior vena cava is dilated in size with <50% respiratory   variability, suggesting right atrial pressure of 15 mmHg.   Laboratory Data:  High Sensitivity Troponin:   Recent Labs  Lab 08/02/24 1942 08/02/24 2142  TROPONINIHS 19* 21*     Chemistry Recent Labs  Lab 08/17/24 1745 08/18/24 0557 08/19/24 1115  NA 132* 135 129*  K 3.6 3.6 4.5  CL 94* 100 98  CO2 26 26 22   GLUCOSE 115* 123* 110*  BUN 18 16 17   CREATININE 0.83 0.66 0.59  CALCIUM  8.8* 8.9 9.4  GFRNONAA >60 >60 >60  ANIONGAP 11 8 9     Recent Labs  Lab 08/17/24 1506 08/18/24 0557 08/19/24 1115  PROT 6.8 6.3* 6.4*  ALBUMIN 3.3* 3.0* 2.9*  AST 87* 83* 133*  ALT 57* 54* 82*  ALKPHOS 201* 186* 204*  BILITOT 1.1 1.0 1.1   Hematology Recent Labs  Lab 08/17/24 1745 08/18/24 0557 08/19/24 1115  WBC 14.7* 14.8* 13.2*  RBC 4.12 3.99 4.21  HGB 12.7 12.4 12.7  HCT 38.6 38.4 39.5  MCV 93.7 96.2 93.8  MCH 30.8 31.1 30.2  MCHC 32.9 32.3 32.2  RDW 14.5 14.6 14.5  PLT 165 147* 157   BNP Recent Labs  Lab 08/16/24 0707 08/17/24 0449 08/18/24 0557  PROBNP 27,035.0* 22,372.0* 10,332.0*    Radiology/Studies:  DG CHEST PORT 1 VIEW Result Date: 08/17/2024 CLINICAL DATA:  Central line placement. EXAM: PORTABLE CHEST 1 VIEW COMPARISON:  Radiograph yesterday FINDINGS: Placement of left internal jugular central line. The tip overlies multiple left-sided pacemaker wires in the region of the innominate/SVC confluence. No pneumothorax. Multi lead left-sided pacemaker in place. Stable cardiomegaly. Mild vascular congestion. No large pleural effusion. No focal airspace disease. Right shoulder arthroplasty. IMPRESSION: 1. Placement of left internal jugular central line with tip overlying the innominate/SVC confluence. No pneumothorax. 2. Stable cardiomegaly and vascular congestion. Electronically Signed   By: Andrea Gasman M.D.   On: 08/17/2024 17:54   ECHOCARDIOGRAM COMPLETE Result Date: 08/17/2024    ECHOCARDIOGRAM REPORT   Patient Name:   Tanya Hogan Date of Exam:  08/17/2024 Medical Rec #:  [id number]       Height:       61.0 in Accession #:    [id number]      Weight:       172.5 lb Date of Birth:  1950-09-28        BSA:          1.774 m Patient Age:    73 years        BP:           103/65  mmHg Patient Gender: F               HR:           89 bpm. Exam Location:  Inpatient Procedure: 2D Echo (Both Spectral and Color Flow Doppler were utilized during            procedure). Indications:    acute systolic chf  History:        Patient has prior history of Echocardiogram examinations, most                 recent 05/13/2024. Pacemaker and Defibrillator, Pulmonary HTN and                 Cirrhosis; Risk Factors:Diabetes, Hypertension and Sleep Apnea.  Sonographer:    Tinnie Barefoot RDCS Referring Phys: [id number] [facility] J PATWARDHAN IMPRESSIONS  1. Left ventricular ejection fraction, by estimation, is <20%. The left ventricle has severely decreased function. The left ventricle demonstrates global hypokinesis. The left ventricular internal cavity size was severely dilated. Left ventricular diastolic parameters are consistent with Grade III diastolic dysfunction (restrictive).  2. Right ventricular systolic function is moderately reduced. The right ventricular size is moderately enlarged. There is moderately elevated pulmonary artery systolic pressure. The estimated right ventricular systolic pressure is 48.4 mmHg.  3. Left atrial size was severely dilated.  4. Right atrial size was severely dilated.  5. A small pericardial effusion is present. The pericardial effusion is anterior to the right ventricle. There is no evidence of cardiac tamponade.  6. The mitral valve is normal in structure. Trivial mitral valve regurgitation. No evidence of mitral stenosis.  7. The aortic valve is tricuspid. There is mild calcification of the aortic valve. Aortic valve regurgitation is not visualized. Aortic valve sclerosis/calcification is present, without any evidence of aortic stenosis.  8. The  inferior vena cava is dilated in size with <50% respiratory variability, suggesting right atrial pressure of 15 mmHg. FINDINGS  Left Ventricle: Left ventricular ejection fraction, by estimation, is <20%. The left ventricle has severely decreased function. The left ventricle demonstrates global hypokinesis. The left ventricular internal cavity size was severely dilated. There is no left ventricular hypertrophy. Left ventricular diastolic parameters are consistent with Grade III diastolic dysfunction (restrictive). Right Ventricle: The right ventricular size is moderately enlarged. No increase in right ventricular wall thickness. Right ventricular systolic function is moderately reduced. There is moderately elevated pulmonary artery systolic pressure. The tricuspid  regurgitant velocity is 2.89 m/s, and with an assumed right atrial pressure of 15 mmHg, the estimated right ventricular systolic pressure is 48.4 mmHg. Left Atrium: Left atrial size was severely dilated. Right Atrium: Right atrial size was severely dilated. Pericardium: A small pericardial effusion is present. The pericardial effusion is anterior to the right ventricle. There is no evidence of cardiac tamponade. Mitral Valve: The mitral valve is normal in structure. Trivial mitral valve regurgitation. No evidence of mitral valve stenosis. Tricuspid Valve: The tricuspid valve is normal in structure. Tricuspid valve regurgitation is not demonstrated. No evidence of tricuspid stenosis. Aortic Valve: The aortic valve is tricuspid. There is mild calcification of the aortic valve. Aortic valve regurgitation is not visualized. Aortic valve sclerosis/calcification is present, without any evidence of aortic stenosis. Pulmonic Valve: The pulmonic valve was normal in structure. Pulmonic valve regurgitation is trivial. No evidence of pulmonic stenosis. Aorta: The aortic root is normal in size and structure. Venous: The inferior vena cava is dilated in size with less  than 50% respiratory variability, suggesting  right atrial pressure of 15 mmHg. IAS/Shunts: No atrial level shunt detected by color flow Doppler. Additional Comments: A device lead is visualized.  LEFT VENTRICLE PLAX 2D LVOT diam:     2.10 cm      Diastology LV SV:         32           LV e' medial:   9.57 cm/s LV SV Index:   18           LV E/e' medial: 10.1 LVOT Area:     3.46 cm  LV Volumes (MOD) LV vol d, MOD A2C: 266.0 ml LV vol d, MOD A4C: 179.0 ml LV vol s, MOD A2C: 231.0 ml LV vol s, MOD A4C: 163.0 ml LV SV MOD A2C:     35.0 ml LV SV MOD A4C:     179.0 ml LV SV MOD BP:      31.3 ml RIGHT VENTRICLE          IVC RV Basal diam:  3.40 cm  IVC diam: 2.30 cm TAPSE (M-mode): 2.1 cm LEFT ATRIUM              Index        RIGHT ATRIUM           Index LA Vol (A2C):   116.0 ml 65.40 ml/m  RA Area:     22.80 cm LA Vol (A4C):   71.2 ml  40.14 ml/m  RA Volume:   68.80 ml  38.79 ml/m LA Biplane Vol: 96.8 ml  54.58 ml/m  AORTIC VALVE             PULMONIC VALVE LVOT Vmax:   67.70 cm/s  PR End Diast Vel: 8.18 msec LVOT Vmean:  39.900 cm/s LVOT VTI:    0.092 m  AORTA Ao Root diam: 3.00 cm Ao Asc diam:  3.20 cm MITRAL VALVE               TRICUSPID VALVE MV Area (PHT): 6.17 cm    TR Peak grad:   33.4 mmHg MV Decel Time: 123 msec    TR Vmax:        289.00 cm/s MV E velocity: 96.70 cm/s MV A velocity: 80.50 cm/s  SHUNTS MV E/A ratio:  1.20        Systemic VTI:  0.09 m                            Systemic Diam: 2.10 cm Toribio Fuel MD Electronically signed by Toribio Fuel MD Signature Date/Time: 08/17/2024/4:42:35 PM    Final    DG Chest 2 View Result Date: 08/16/2024 EXAM: 2 VIEW(S) XRAY OF THE CHEST 08/16/2024 07:25:00 AM COMPARISON: 08/14/2024 CLINICAL HISTORY: 73 year old female with shortness of breath. FINDINGS: LINES, TUBES AND DEVICES: Left chest ICD noted. LUNGS AND PLEURA: Progressed pulmonary vascularity. No edema. No confluent lung opacity. No pleural effusion. No pneumothorax. HEART AND MEDIASTINUM:  Cardiomegaly. BONES AND SOFT TISSUES: Right shoulder arthroplasty noted. Multilevel degenerative changes of thoracic spine. No acute osseous abnormality. IMPRESSION: 1. No acute cardiopulmonary abnormality. Electronically signed by: Helayne Hurst MD 08/16/2024 07:30 AM EST RP Workstation: [id number]   Assessment and Plan:  GERALDINE SANDBERG is a 73 y.o. female with a NICM/CHB, HFrEF likely in part 2/2 pacing-induced CM s/p LEFT MDT CRTP (RA/RV/CS-AIV lead) with subsequent addition of RV-LBaP lead (11/03/21) with subsequent addition of RV-d lead (05/31/24) for ATP for refractory  VT, DM2, NASH cirrhosis who presented with lethargy and respiratory distress and is now found to have 4/4 cultures with MRSA.   NICM HFrEF End stage HF Refractory VT LEFT MDT CRTD NASH cirrhosis This is a very difficult situation for Ms. Zoeller as she has end-stage HF and has had refractory VT requiring both amio and quinidine as well as ATP from her RV-ICD lead for termination.  When she initially had her recurrent VT episodes I had reviewed her case both at [facility] and with the extraction team at [facility].  She was felt to be prohibitive risk for median sternotomy and would likely not survive and/or recover from this.  We had discussed palliative measures versus uptitration of antiarrhythmics and as a last resort upgrade of her device to accommodate a defibrillator lead for ATP therapies.  Despite amiodarone  and quinidine she continued to have VT and so her device was upgraded.  She actually has done fairly well from arrhythmia standpoint following her CRT-D upgrade however her baseline functional status is extremely limited.  She still lives at her house with her brother and provides most of his care as he has dementia.  We initially reviewed different options for management of her MRSA bacteremia she seemed interested in complete hardware removal and reimplant.  After further discussion about her quality of life over the past 2 months she  became tearful and confirmed that she really has not had any good days over the past several months with recurrent hospitalizations for heart failure and ventricular arrhythmias.  At this point she has end-stage heart failure.  Although I do think that we probably could get her through the surgery safely, I am concerned that she has limited ability to rehab postsurgery.  Additionally I would consider her high risk for SVC laceration and/or cardiac perforation without any bailout strategies.  I think that the risk of her dying during the procedure is over 10%.  If we proceeded with lead extraction and reimplant and everything went exactly as planned I would suspect we are not prolonging her life by more than 6 to 12 months given the refractory HF and recurrent VT episodes.  After reviewing different options she seems more interested in continuing IV antibiotics with transition to suppressive antibiotics excepting that with time the MRSA will likely return and cause endocarditis and subsequent death.  I will talk with her family in person today whenever they are here.  I do think that it would be worth having palliative care talk with her and she was agreeable to this. They have been consulted before but she was overwhelmed in the past with the amount of doctors she was having to see.  For questions or updates, please contact [facility] Please consult [email or web address] for contact info under   Signed, [name], MD  08/19/2024 3:30 PM     [1]  Allergies Allergen Reactions   Injectafer [Ferric Carboxymaltose] Shortness Of Breath    Mild SOB following injectafer infusion    Penicillins Anaphylaxis and Rash    Tolerated ceftin  2020   Accupril [Quinapril Hcl] Other (See Comments)    Hyperkalemia=high potassium level   Celebrex [Celecoxib] Other (See Comments)    Hx hematemesis, bleeding ulcer   Cozaar  [Losartan  Potassium] Itching   Dml Forte Rash   Entresto [Sacubitril-Valsartan]  Other (See Comments)    Hyperkalemia   Flonase  [Fluticasone ] Other (See Comments)    Hx nasal ulcer   Lactose Intolerance (Gi) Diarrhea   Lipitor [Atorvastatin ] Other (  See Comments)    Myalgias    Nasonex  [Mometasone  Furoate] Other (See Comments)    Epistaxis    Nsaids Other (See Comments)    Hx hematemesis, bleeding ulcer   Advil [Ibuprofen] Nausea And Vomiting    Hx hematemesis, bleeding ulcer   Baking Soda-Fluoride [Sodium Fluoride] Itching and Rash   Bayer Aspirin  [Aspirin ] Other (See Comments)    Hx hematemesis, bleeding ulcer   Chamomile Hives   Clindamycin /Lincomycin Rash   Codeine Nausea And Vomiting   Lasix  [Furosemide ] Other (See Comments)    Tinnitus  Hypotension   Miralax  [Polyethylene Glycol (Macrogol)] Other (See Comments)    Leaky gut syndrome   Quinine Rash   Reclast  [Zoledronic  Acid] Rash and Other (See Comments)    Tremors    Rifadin [Rifampin] Other (See Comments)    Unknown reaction   Singulair  [Montelukast ] Other (See Comments)    Dizziness    Sulfa Antibiotics Rash   Ultram  [Tramadol ] Anxiety and Rash   Vesicare  [Solifenacin ] Other (See Comments)    Urinary retention   Wellbutrin  [Bupropion ] Other (See Comments)    Myalgias  Skin crawling sensation   Wool Alcohol  [Lanolin] Rash   "

## 2024-08-19 NOTE — Telephone Encounter (Signed)
 Spoke with both Tanya Hogan's son Tanya Hogan) and daughter Tanya Hogan) for over 30 minutes regarding different options for management of her MRSA bacteremia, end-stage heart failure, recurrent ventricular arrhythmias and generalized failure to thrive.  We reviewed the 2 options for management including continuation of IV antibiotics with transitioning to suppressive antibiotics versus continuation of IV antibiotics with subsequent TEE for evaluation of endocarditis followed by complete hardware removal, temp perm implant and subsequent reimplant of CRT-D from the right side.  I explained that my main concern was her current functional ability and profound frailty that would preclude her from needing a candidate but also makes rehab post device extraction much more challenging.  I do not think that whether we extract all leads, allow for bacteremia to continue to clear and subsequently reimplant versus continuation of suppressive antibiotics that it will significantly alter her longevity as she has extremely poor prognosis with refractory VT on 2 antiarrhythmic medications and is not a candidate for ablation.  Additionally her end-stage heart failure continues to result in recurrent hospitalizations.  Her daughter expressed that Tanya Hogan has always wanted to be aggressive with her medical care and that she would likely want to proceed with any procedures that could improve her longevity.  I reiterated that regardless of what decision we make I do not think that I am going to significantly alter the course of her disease regardless of whether we consider lead extraction or suppressive antibiotics as her 1 year mortality is exceptionally high.  I would not otherwise have considered upgrading her device from CRT-P to CRT-D except for the fact that allowed for ATP of recurrent slow symptomatic VT.  My main concern is that over the past 2 months she has had no good days despite maintaining mostly NSR/BiV pacing without  refractory VT.  Her daughter said that the other day she could barely get dressed without a significant amount of assistance although this was probably in the setting of the beginning of the bacteremia.  She has had a steady decline though over the past several months even preceding the current infection.  Will discuss further with her tomorrow but I expressed that my main concern was the recovery post extraction although I do suspect that her risk of mortality is over 10% for the surgery and damage to large vessels or cardiac perforation resulting in death.

## 2024-08-19 NOTE — Progress Notes (Addendum)
 " Progress Note   Patient: Tanya Hogan FMW:[id number] DOB: 1950/12/10 DOA: 08/16/2024     3 DOS: the patient was seen and examined on 08/19/2024   Brief hospital course: [name] was admitted to the hospital with the working diagnosis of MRSA bacteremia.    73 yo female with the past medical history of heart failure, hypertension, chronic anemia, peripheral vascular disease, T2DM, hypothyroidism and obesity class 1 who presented with dyspnea. Recent hospitalization 12/10 to 08/08/24 for heart failure exacerbation. She was diuresed about 6 L and was discharged on oral torsemide  20 mg daily with instructions to increase the dose to 40 mg in case of volume overload.  Unfortunately at home she developed recurrent dyspnea and lower extremity edema prompting her to come back to the ED.  On his initial physical examination her blood pressure was 93/51, HR 89, RR 35 and 02 saturation 89%  Lungs with no wheezing or rhonchi, heart with S1 and S2 present and regular with no gallops or rubs, abdomen with no distention and trace lower extremity edema.   Patient was placed on IV bumetanide  for diuresis.   12/25 with lethargy, poorly response to IV loop diuretics. Place on Bipap for increased work of breathing. Transferred to ICU and placed central venous catheter.  MRSA bacteremia, left lower lobe infiltrate, likely pneumonia present on admission. Placed on antibiotic therapy  12/26 consulted EP with recommendations to extract pacemaker leads.  12/27 transferred to [facility].   Assessment and Plan: * MRSA bacteremia 2 out of 2 bottles positive for MRSA.  Wbc 13.3  Possible left lower lobe pneumonia   Patient has been on antibiotic therapy with vancomycin   Follow up on repeat blood cultures. Plan for TEE next week, Patient is very high risk for lead explantation, she would like to continue with suppressive antibiotics.  She has very poor prognosis in the setting of end stage heart failure  Will  consult palliative care.   Acute on chronic systolic CHF (congestive heart failure) (HCC) Echocardiogram with reduced LV systolic function EF <20%, global hypokinesis, severe dilatation of LV cavity, grade III diastolic dysfunction (restrictive), RV systolic function with moderate reduction, RVSP 48.8 mmHg,  LA and RA with severe dilatation, small pericardial effusion   Urine output is 1,250 ml Systolic blood pressure 100 mmHg range  Plan to give one dose of IV loop diuretic today, patient declines furosemide  due to side effects but accepted having bumetanide .  Holding guideline directed medical therapy due to risk of hypotension   Essential hypertension Continue to hold on antihypertensive agents due to risk of hypotension Continue blood pressure monitoring   Ventricular tachycardia, unspecified (HCC) Refractory VT, end stage heart failure  On amiodarone  and quinidine.  SP CRT D  Type 2 diabetes mellitus with hyperlipidemia (HCC) Continue glucose cover and monitoring with insulin  sliding scale Continue with statin therapy   NASH (nonalcoholic steatohepatitis) Stable with no signs of decompensation   Gastroesophageal reflux disease Continue pantoprazole    COPD (chronic obstructive pulmonary disease) (HCC) No signs of exacerbation  Continue bronchodilator therapy Continue oxymetry monitoring   Hypothyroidism Continue with levothyroxine    Irritable bowel syndrome with constipation Continue home bowel regimen Currently stable  History of left breast cancer In remission follow-up as an outpatient  Microcytic anemia Positive iron deficiency anemia Cell count has been stable   Obesity, Class I, BMI 30-34.9 Calculated BMI is 33.7      Subjective: Patient with no chest pain and no dyspnea, no nausea or vomiting,  continue very weak and deconditioned. Positive dyspnea but not edema   Physical Exam: Vitals:   08/19/24 0406 08/19/24 0415 08/19/24 0521 08/19/24 1145  BP:     104/64  Pulse:  89  90  Resp:  (!) 23  (!) 22  Temp:  (!) 95.9 F (35.5 C) 97.7 F (36.5 C) 98 F (36.7 C)  TempSrc:  Rectal Oral Oral  SpO2:  94%  95%  Weight: 80.9 kg     Height:       Neurology awake and alert ENT with mild pallor Cardiovascular with S1 and S2 present and regular with no gallops, rubs or murmurs Respiratory with mild rales at bases with no wheezing Abdomen soft and non tender, protuberant No lower extremity edema  Data Reviewed:    Family Communication: no family at the bedside   Disposition: Status is: Inpatient Remains inpatient appropriate because: IV antibiotic therapy and TEE   Planned Discharge Destination: Home     Author: Elidia Toribio Furnace, MD 08/19/2024 3:20 PM  For on call review [email or web address].  "

## 2024-08-19 NOTE — Assessment & Plan Note (Addendum)
 Possible left lower lobe pneumonia  present on admission  12/25 blood cultures 4/4 bottles positive for MRSA 12/27 blood cultures 2/2 bottles positive for MRSA  12/29 blood cultures pending   Initially placed on IV vancomycin , today changed to daptomycin   Patient is very high risk for lead explantation. High risk for TEE   She has very poor prognosis in the setting of end stage heart failure  Continue to follow up with ID recommendations  Palliative care consulted, patient now is DNR .   Noted rash lower extremities not typical for [place] lesions, question drug reaction.

## 2024-08-19 NOTE — Progress Notes (Signed)
 Patient has arrived to the unit, CCMD notified.

## 2024-08-19 NOTE — Evaluation (Signed)
 Physical Therapy Evaluation Patient Details Name: Tanya Hogan MRN: [id number] DOB: 06-27-1951 Today's Date: 08/19/2024  History of Present Illness  Pt is 73 yo with recent hospitalization on 12/11 with swelling and difficulty breathing. Currently pt was admitted 12/24 for acute on chronic HF. 12/25 was minimally responsive requiring bipap. Blood cultures showed MRSA. EFY:[id number] revision of pacemaker/AICD, breast cancer, chronic heart failure, nonalcoholic cirrhosis, BiV pacemake, depression, DM2, GERD, HTN, hypothyroidism, valvular heart disease, pulmonary hypertension, sleep apnea and ventricular tachy arrhythmia   Clinical Impression  Pt presents with condition above and deficits mentioned below, see PT Problem List. At baseline, pt is mod I with RW vs rollator for functional mobility. She lives with family in a 1-level house with a ramped entry. Currently, the pt is displaying deficits in balance, strength, power, and endurance. She was limited to only ambulating ~36 ft today due to a headache that began with gait and disappeared with seated rest break. Her SBP was in the low 100s pre mobility and post mobility and with initial standing, but it could be beneficial to monitor it while still standing after standing/ambulating a few minutes. She is currently only needing CGA for safety with transfers and gait, but is functioning below her baseline now. Will recommend pt resume HHPT at d/c and will continue to follow acutely to maximize her return to baseline prior to d/c.      If plan is discharge home, recommend the following: A little help with walking and/or transfers;A little help with bathing/dressing/bathroom;Assist for transportation;Help with stairs or ramp for entrance;Assistance with cooking/housework   Can travel by private vehicle        Equipment Recommendations None recommended by PT  Recommendations for Other Services       Functional Status Assessment Patient has had a  recent decline in their functional status and demonstrates the ability to make significant improvements in function in a reasonable and predictable amount of time.     Precautions / Restrictions Precautions Precautions: Fall Recall of Precautions/Restrictions: Impaired Precaution/Restrictions Comments: watch BP Restrictions Weight Bearing Restrictions Per Provider Order: No      Mobility  Bed Mobility               General bed mobility comments: Pt sitting in chair upon arrival and at end of session    Transfers Overall transfer level: Needs assistance Equipment used: Rolling Thivierge (2 wheels) Transfers: Sit to/from Stand Sit to Stand: Contact guard assist           General transfer comment: Slow to rise from chair, CGA for safety    Ambulation/Gait Ambulation/Gait assistance: Contact guard assist Gait Distance (Feet): 36 Feet Assistive device: Rolling Henckel (2 wheels) Gait Pattern/deviations: Step-through pattern, Decreased stride length, Trunk flexed Gait velocity: reduced Gait velocity interpretation: <1.8 ft/sec, indicate of risk for recurrent falls   General Gait Details: Pt ambulates slowly with a flexed trunk/hips. No LOB, but distance limited by headache that began with walking and stopped when sitting. CGA for safety  Stairs            Wheelchair Mobility     Tilt Bed    Modified Rankin (Stroke Patients Only)       Balance Overall balance assessment: Needs assistance Sitting-balance support: No upper extremity supported, Feet supported Sitting balance-[name] Scale: Fair     Standing balance support: Bilateral upper extremity supported, Reliant on assistive device for balance, During functional activity Standing balance-[name] Scale: Poor Standing balance comment: Reliant on RW  Pertinent Vitals/Pain Pain Assessment Pain Assessment: Faces Faces Pain Scale: Hurts little more Pain Location:  headache Pain Descriptors / Indicators: Headache Pain Intervention(s): Monitored during session, Limited activity within patient's tolerance    Home Living Family/patient expects to be discharged to:: Private residence Living Arrangements: Other relatives Available Help at Discharge: Other (Comment);Family Type of Home: House Home Access: Ramped entrance       Home Layout: One level Home Equipment: Agricultural Consultant (2 wheels);Rollator (4 wheels);Shower seat;Wheelchair - manual Additional Comments: pt is primary caregiver for brother    Prior Function Prior Level of Function : Independent/Modified Independent             Mobility Comments: RW for short distances and rollator for community ADLs Comments: Ind with ADLs. Daughter assists with grocery shopping     Extremity/Trunk Assessment   Upper Extremity Assessment Upper Extremity Assessment: Defer to OT evaluation    Lower Extremity Assessment Lower Extremity Assessment: Generalized weakness (noted with functional mobility; reports some numbness in feet due to being cold currently)    Cervical / Trunk Assessment Cervical / Trunk Assessment: Kyphotic  Communication   Communication Communication: No apparent difficulties    Cognition Arousal: Alert Behavior During Therapy: WFL for tasks assessed/performed   PT - Cognitive impairments: No apparent impairments                         Following commands: Intact       Cueing Cueing Techniques: Verbal cues     General Comments General comments (skin integrity, edema, etc.): SBP low 100s sitting start of session, initial standing, and sitting a few minutes at end of session (BP would not take immediately upon sitting, needing to be repeated 2x before it took)    Exercises     Assessment/Plan    PT Assessment Patient needs continued PT services  PT Problem List Decreased strength;Decreased activity tolerance;Decreased balance;Decreased  mobility;Pain       PT Treatment Interventions DME instruction;Gait training;Functional mobility training;Therapeutic activities;Therapeutic exercise;Balance training;Patient/family education;Wheelchair mobility training;Neuromuscular re-education    PT Goals (Current goals can be found in the Care Plan section)  Acute Rehab PT Goals Patient Stated Goal: to improve PT Goal Formulation: With patient Time For Goal Achievement: 09/02/24 Potential to Achieve Goals: Good    Frequency Min 2X/week     Co-evaluation               AM-PAC PT 6 Clicks Mobility  Outcome Measure Help needed turning from your back to your side while in a flat bed without using bedrails?: A Little Help needed moving from lying on your back to sitting on the side of a flat bed without using bedrails?: A Little Help needed moving to and from a bed to a chair (including a wheelchair)?: A Little Help needed standing up from a chair using your arms (e.g., wheelchair or bedside chair)?: A Little Help needed to walk in hospital room?: A Little Help needed climbing 3-5 steps with a railing? : A Little 6 Click Score: 18    End of Session   Activity Tolerance: Patient tolerated treatment well;Patient limited by pain Patient left: in chair;with call bell/phone within reach;Other (comment) (no chair alarm upon arrival) Nurse Communication: Mobility status;Other (comment) (headache that occurred with movement and stopped with rest, BP) PT Visit Diagnosis: Unsteadiness on feet (R26.81);Other abnormalities of gait and mobility (R26.89);Muscle weakness (generalized) (M62.81);Pain;Difficulty in walking, not elsewhere classified (R26.2) Pain - Right/Left:  (headache)  Pain - part of body:  (headache)    Time: 8348-8292 PT Time Calculation (min) (ACUTE ONLY): 16 min   Charges:   PT Evaluation $PT Eval Moderate Complexity: 1 Mod   PT General Charges $$ ACUTE PT VISIT: 1 Visit         Theo Ferretti, PT,  DPT Acute Rehabilitation Services  Office: [phone]   Theo CHRISTELLA Ferretti 08/19/2024, 5:17 PM

## 2024-08-19 NOTE — Assessment & Plan Note (Signed)
 Echocardiogram with reduced LV systolic function EF <20%, global hypokinesis, severe dilatation of LV cavity, grade III diastolic dysfunction (restrictive), RV systolic function with moderate reduction, RVSP 48.8 mmHg,  LA and RA with severe dilatation, small pericardial effusion   Urine output is 1,250 ml Systolic blood pressure 100 mmHg range  Plan to give one dose of IV loop diuretic today, patient declines furosemide  due to side effects but accepted having bumetanide .  Holding guideline directed medical therapy due to risk of hypotension

## 2024-08-19 NOTE — Plan of Care (Signed)
   Problem: Education: Goal: Knowledge of General Education information will improve Description: Including pain rating scale, medication(s)/side effects and non-pharmacologic comfort measures Outcome: Progressing   Problem: Health Behavior/Discharge Planning: Goal: Ability to manage health-related needs will improve Outcome: Progressing   Problem: Clinical Measurements: Goal: Diagnostic test results will improve Outcome: Progressing

## 2024-08-19 NOTE — Progress Notes (Signed)
 Oral temperature has increased to 97.7 oral.

## 2024-08-19 NOTE — Evaluation (Signed)
 Occupational Therapy Evaluation Patient Details Name: Tanya Hogan MRN: [id number] DOB: [birth year] Today's Date: 08/19/2024   History of Present Illness   Pt is 73 yo with recent hospitalization on 12/11 with swelling and difficulty breathing. Currently pt was admitted 12/24 for acute on chronic HF. 12/25 was minimally responsive requiring bipap. Blood cultures showed MRSA. EFY:[id number] revision of pacemaker/AICD, breast cancer, chronic heart failure, nonalcoholic cirrhosis, BiV pacemake, depression, DM2, GERD, HTN, hypothyroidism, valvular heart disease, pulmonary hypertension, sleep apnea and ventricular tachy arrhythmia     Clinical Impressions OT was re-consulted based on above changes in medical status.  Today pt is requiring set up  to min assist for ADLs. Functional transfers are  CGA for balance with RW. Pt limited by decreased activity tolerance. Requiring seated rest break for ADLs after 46ft of mobility. O2 sats sustaining 98% or greater on 3L O2 with activity. Left resting on 2L. Pt with adequate family support available upon d/c, recommending HHOT, to return to PLOF. OT will continue to follow acutely to maximize functional independence.     If plan is discharge home, recommend the following:   A little help with walking and/or transfers;A little help with bathing/dressing/bathroom;Assistance with cooking/housework;Assist for transportation     Functional Status Assessment   Patient has had a recent decline in their functional status and demonstrates the ability to make significant improvements in function in a reasonable and predictable amount of time.     Equipment Recommendations   None recommended by OT      Precautions/Restrictions   Precautions Precautions: Fall Recall of Precautions/Restrictions: Impaired Restrictions Weight Bearing Restrictions Per Provider Order: No     Mobility Bed Mobility     General bed mobility comments: received in  recliner    Transfers Overall transfer level: Needs assistance Equipment used: Rolling Choudhry (2 wheels) Transfers: Sit to/from Stand Sit to Stand: Contact guard assist           General transfer comment: Slow to rise. ~65ft of mobility before fatigue and requiring extended seated rest break      Balance Overall balance assessment: Needs assistance Sitting-balance support: No upper extremity supported, Feet supported Sitting balance-[name] Scale: Fair     Standing balance support: Bilateral upper extremity supported, Reliant on assistive device for balance, During functional activity Standing balance-[name] Scale: Poor Standing balance comment: Reliant on RW       ADL either performed or assessed with clinical judgement   ADL Overall ADL's : Needs assistance/impaired Eating/Feeding: Set up;Sitting   Grooming: Wash/dry face;Oral care;Brushing hair;Set up;Sitting Grooming Details (indicate cue type and reason): Attempted to complete standing, limited d/t fatigue         Upper Body Dressing : Set up;Sitting   Lower Body Dressing: Minimal assistance;Sit to/from stand   Toilet Transfer: Contact guard assist;Rolling Kage (2 wheels);Ambulation Toilet Transfer Details (indicate cue type and reason): ~70ft. Limited d/t fatigue Toileting- Clothing Manipulation and Hygiene: Contact guard assist;Sit to/from stand       Functional mobility during ADLs: Contact guard assist;Rolling Milone (2 wheels) General ADL Comments: Limited by decreased activity tolerance     Vision Baseline Vision/History: 0 No visual deficits Vision Assessment?: No apparent visual deficits            Pertinent Vitals/Pain Pain Assessment Pain Assessment: Faces Faces Pain Scale: Hurts a little bit Pain Location: stomach Pain Descriptors / Indicators: Discomfort, Grimacing Pain Intervention(s): Monitored during session     Extremity/Trunk Assessment Upper Extremity Assessment Upper  Extremity Assessment:  Generalized weakness   Lower Extremity Assessment Lower Extremity Assessment: Defer to PT evaluation   Cervical / Trunk Assessment Cervical / Trunk Assessment: Kyphotic   Communication Communication Communication: No apparent difficulties   Cognition Arousal: Alert Behavior During Therapy: WFL for tasks assessed/performed Cognition: Cognition impaired         Attention impairment (select first level of impairment): Sustained attention Executive functioning impairment (select all impairments): Problem solving OT - Cognition Comments: Mild problem solving and attention deficits. Likely d/t fatigue.       Following commands: Intact       Cueing  General Comments   Cueing Techniques: Verbal cues  Received on 4L, O2 sats at 100%, weaned to 3L for session. O2 sustaining >98%. Left resting on 2L at 99%           Home Living Family/patient expects to be discharged to:: Private residence Living Arrangements: Other relatives Available Help at Discharge: Other (Comment);Family Type of Home: House Home Access: Ramped entrance     Home Layout: One level     Bathroom Shower/Tub: Tub/shower unit;Sponge bathes at baseline   Bathroom Toilet: Handicapped height Bathroom Accessibility: Yes   Home Equipment: Agricultural Consultant (2 wheels);Rollator (4 wheels);Shower seat;Wheelchair - manual   Additional Comments: pt is primary caregiver for brother      Prior Functioning/Environment Prior Level of Function : Independent/Modified Independent     Mobility Comments: RW for short distances and rollator for community ADLs Comments: Ind with ADLs. Daughter assists with grocery shopping    OT Problem List: Decreased activity tolerance;Decreased strength;Decreased cognition;Cardiopulmonary status limiting activity   OT Treatment/Interventions: Self-care/ADL training;Therapeutic exercise;Energy conservation;DME and/or AE instruction;Therapeutic  activities;Patient/family education;Balance training      OT Goals(Current goals can be found in the care plan section)   Acute Rehab OT Goals Patient Stated Goal: To get better OT Goal Formulation: With patient Time For Goal Achievement: 09/01/24 Potential to Achieve Goals: Good   OT Frequency:  Min 2X/week       AM-PAC OT 6 Clicks Daily Activity     Outcome Measure Help from another person eating meals?: None Help from another person taking care of personal grooming?: A Little Help from another person toileting, which includes using toliet, bedpan, or urinal?: A Little Help from another person bathing (including washing, rinsing, drying)?: A Little Help from another person to put on and taking off regular upper body clothing?: A Little Help from another person to put on and taking off regular lower body clothing?: A Little 6 Click Score: 19   End of Session Equipment Utilized During Treatment: Rolling Micheals (2 wheels);Oxygen  Nurse Communication: Mobility status  Activity Tolerance: Patient tolerated treatment well Patient left: in chair;with call bell/phone within reach  OT Visit Diagnosis: Muscle weakness (generalized) (M62.81)                Time: 9241-9157 OT Time Calculation (min): 44 min Charges:  OT General Charges $OT Visit: 1 Visit OT Evaluation $OT Eval Moderate Complexity: 1 Mod OT Treatments $Self Care/Home Management : 23-37 mins  Adrianne BROCKS, OT  Acute Rehabilitation Services Office [phone] Secure chat preferred   Adrianne GORMAN Savers 08/19/2024, 8:56 AM

## 2024-08-19 NOTE — Progress Notes (Signed)
 "  Progress Note  Patient Name: Tanya Hogan Date of Encounter: 08/19/2024 Primary Cardiologist: Redell Shallow, MD   Subjective   Overnight left the unit. Patient notes that she is unsure if she would go through with lead extraction.  Her children are coming this weekend to help her discuss.  Vital Signs    Vitals:   08/19/24 0353 08/19/24 0406 08/19/24 0415 08/19/24 0521  BP: 107/72     Pulse: 90  89   Resp: (!) 27  (!) 23   Temp:   (!) 95.9 F (35.5 C) 97.7 F (36.5 C)  TempSrc:   Rectal Oral  SpO2: 100%  94%   Weight:  80.9 kg    Height:        Intake/Output Summary (Last 24 hours) at 08/19/2024 1124 Last data filed at 08/18/2024 2327 Gross per 24 hour  Intake 571.31 ml  Output 1250 ml  Net -678.69 ml   Filed Weights   08/17/24 0320 08/18/24 2140 08/19/24 0406  Weight: 78.2 kg 80.1 kg 80.9 kg    Physical Exam   GEN: No acute distress.   Neck: No JVD Cardiac: RRR, no murmurs, rubs, or gallops.  Respiratory: Coarse breath sounds bilaterally GI: Soft, nontender, non-distended  MS: No edema  Labs   Telemetry: SR with AV Pacing and rare PVCs   Chemistry Recent Labs  Lab 08/17/24 0449 08/17/24 1506 08/17/24 1745 08/18/24 0557  NA 130* 133* 132* 135  K 3.8 3.6 3.6 3.6  CL 94* 95* 94* 100  CO2 22 25 26 26   GLUCOSE 164* 124* 115* 123*  BUN 22 19 18 16   CREATININE 0.90 0.93 0.83 0.66  CALCIUM  9.4 9.1 8.8* 8.9  PROT 6.7 6.8  --  6.3*  ALBUMIN 3.3* 3.3*  --  3.0*  AST 98* 87*  --  83*  ALT 57* 57*  --  54*  ALKPHOS 185* 201*  --  186*  BILITOT 1.2 1.1  --  1.0  GFRNONAA >60 >60 >60 >60  ANIONGAP 14 13 11 8      Hematology Recent Labs  Lab 08/17/24 0449 08/17/24 1745 08/18/24 0557  WBC 14.3* 14.7* 14.8*  RBC 4.31 4.12 3.99  HGB 13.2 12.7 12.4  HCT 40.2 38.6 38.4  MCV 93.3 93.7 96.2  MCH 30.6 30.8 31.1  MCHC 32.8 32.9 32.3  RDW 14.5 14.5 14.6  PLT 170 165 147*    Cardiac Enzymes  Recent Labs  Lab 08/16/24 0707  TRNPT 33*      BNP Recent Labs  Lab 08/16/24 0707 08/17/24 0449 08/18/24 0557  PROBNP 27,035.0* 22,372.0* 10,332.0*     Cardiac Studies   Cardiac Studies & Procedures   ______________________________________________________________________________________________ CARDIAC CATHETERIZATION  CARDIAC CATHETERIZATION 05/18/2024  Conclusion 1.  Normal right dominant circulation with no obstructive coronary artery disease. 2.  Venography from the left axillary demonstrated that the subclavian vein proximal to the insertion site of multiple leads is patent however a wire and catheter could not cross the this confluence. 3.  Fick cardiac output of 5.7 L/min and Fick cardiac index of 3.1 L/min/m with following hemodynamics: Right atrial pressure mean of 8 mmHg Right ventricular pressure 52/1 with an end-diastolic pressure of 10 mmHg Wedge pressure mean of 23 mmHg with V waves to 29 mmHg PA pressure of 51/27 with mean of 37 mmHg PVR of PVR of 2.5 Woods units PA pulsatility index of 3 4.  LVEDP of 21 to 24 mmHg across the cardiac cycle  Recommendation:  Medical therapy  Findings Coronary Findings Diagnostic  Dominance: Right  No diagnostic findings have been documented. Intervention  No interventions have been documented.     ECHOCARDIOGRAM  ECHOCARDIOGRAM COMPLETE 08/17/2024  Narrative ECHOCARDIOGRAM REPORT    Patient Name:   DAFNEY FARLER Date of Exam: 08/17/2024 Medical Rec #:  [id number]       Height:       61.0 in Accession #:    [id number]      Weight:       172.5 lb Date of Birth:  [birth year]        BSA:          1.774 m Patient Age:    73 years        BP:           103/65 mmHg Patient Gender: F               HR:           89 bpm. Exam Location:  Inpatient  Procedure: 2D Echo (Both Spectral and Color Flow Doppler were utilized during procedure).  Indications:    acute systolic chf  History:        Patient has prior history of Echocardiogram examinations, most recent  05/13/2024. Pacemaker and Defibrillator, Pulmonary HTN and Cirrhosis; Risk Factors:Diabetes, Hypertension and Sleep Apnea.  Sonographer:    Tinnie Barefoot RDCS Referring Phys: [id number] [facility] J PATWARDHAN  IMPRESSIONS   1. Left ventricular ejection fraction, by estimation, is <20%. The left ventricle has severely decreased function. The left ventricle demonstrates global hypokinesis. The left ventricular internal cavity size was severely dilated. Left ventricular diastolic parameters are consistent with Grade III diastolic dysfunction (restrictive). 2. Right ventricular systolic function is moderately reduced. The right ventricular size is moderately enlarged. There is moderately elevated pulmonary artery systolic pressure. The estimated right ventricular systolic pressure is 48.4 mmHg. 3. Left atrial size was severely dilated. 4. Right atrial size was severely dilated. 5. A small pericardial effusion is present. The pericardial effusion is anterior to the right ventricle. There is no evidence of cardiac tamponade. 6. The mitral valve is normal in structure. Trivial mitral valve regurgitation. No evidence of mitral stenosis. 7. The aortic valve is tricuspid. There is mild calcification of the aortic valve. Aortic valve regurgitation is not visualized. Aortic valve sclerosis/calcification is present, without any evidence of aortic stenosis. 8. The inferior vena cava is dilated in size with <50% respiratory variability, suggesting right atrial pressure of 15 mmHg.  FINDINGS Left Ventricle: Left ventricular ejection fraction, by estimation, is <20%. The left ventricle has severely decreased function. The left ventricle demonstrates global hypokinesis. The left ventricular internal cavity size was severely dilated. There is no left ventricular hypertrophy. Left ventricular diastolic parameters are consistent with Grade III diastolic dysfunction (restrictive).  Right Ventricle: The right  ventricular size is moderately enlarged. No increase in right ventricular wall thickness. Right ventricular systolic function is moderately reduced. There is moderately elevated pulmonary artery systolic pressure. The tricuspid regurgitant velocity is 2.89 m/s, and with an assumed right atrial pressure of 15 mmHg, the estimated right ventricular systolic pressure is 48.4 mmHg.  Left Atrium: Left atrial size was severely dilated.  Right Atrium: Right atrial size was severely dilated.  Pericardium: A small pericardial effusion is present. The pericardial effusion is anterior to the right ventricle. There is no evidence of cardiac tamponade.  Mitral Valve: The mitral valve is normal in structure. Trivial mitral valve regurgitation. No evidence of mitral  valve stenosis.  Tricuspid Valve: The tricuspid valve is normal in structure. Tricuspid valve regurgitation is not demonstrated. No evidence of tricuspid stenosis.  Aortic Valve: The aortic valve is tricuspid. There is mild calcification of the aortic valve. Aortic valve regurgitation is not visualized. Aortic valve sclerosis/calcification is present, without any evidence of aortic stenosis.  Pulmonic Valve: The pulmonic valve was normal in structure. Pulmonic valve regurgitation is trivial. No evidence of pulmonic stenosis.  Aorta: The aortic root is normal in size and structure.  Venous: The inferior vena cava is dilated in size with less than 50% respiratory variability, suggesting right atrial pressure of 15 mmHg.  IAS/Shunts: No atrial level shunt detected by color flow Doppler.  Additional Comments: A device lead is visualized.   LEFT VENTRICLE PLAX 2D LVOT diam:     2.10 cm      Diastology LV SV:         32           LV e' medial:   9.57 cm/s LV SV Index:   18           LV E/e' medial: 10.1 LVOT Area:     3.46 cm  LV Volumes (MOD) LV vol d, MOD A2C: 266.0 ml LV vol d, MOD A4C: 179.0 ml LV vol s, MOD A2C: 231.0 ml LV vol s,  MOD A4C: 163.0 ml LV SV MOD A2C:     35.0 ml LV SV MOD A4C:     179.0 ml LV SV MOD BP:      31.3 ml  RIGHT VENTRICLE          IVC RV Basal diam:  3.40 cm  IVC diam: 2.30 cm TAPSE (M-mode): 2.1 cm  LEFT ATRIUM              Index        RIGHT ATRIUM           Index LA Vol (A2C):   116.0 ml 65.40 ml/m  RA Area:     22.80 cm LA Vol (A4C):   71.2 ml  40.14 ml/m  RA Volume:   68.80 ml  38.79 ml/m LA Biplane Vol: 96.8 ml  54.58 ml/m AORTIC VALVE             PULMONIC VALVE LVOT Vmax:   67.70 cm/s  PR End Diast Vel: 8.18 msec LVOT Vmean:  39.900 cm/s LVOT VTI:    0.092 m  AORTA Ao Root diam: 3.00 cm Ao Asc diam:  3.20 cm  MITRAL VALVE               TRICUSPID VALVE MV Area (PHT): 6.17 cm    TR Peak grad:   33.4 mmHg MV Decel Time: 123 msec    TR Vmax:        289.00 cm/s MV E velocity: 96.70 cm/s MV A velocity: 80.50 cm/s  SHUNTS MV E/A ratio:  1.20        Systemic VTI:  0.09 m Systemic Diam: 2.10 cm  Toribio Fuel MD Electronically signed by Toribio Fuel MD Signature Date/Time: 08/17/2024/4:42:35 PM    Final          ______________________________________________________________________________________________          Assessment & Plan   MRSA Bacteremia/ Suspected PNA   - ID consulted on IV ABX - no clear vegetations nor significant valvular dfx noted on TTE  - I had discussed TEE with patient and placed her on schedule for Tuesday;  ultimately, if she defers lead extraction (reasonable) then we will not pursue TEE as we would not proceed with valve surgery or angiovac based on the results - saw EP this admission   2. Acute on Chronic Systolic Heart Failure  - long standing CM - NICM, LHC 2021 normal cors  - Echo this admit EF < 20%, RV mod reduced, IVC dilated, assumed RAP ~15. Prior echo EF 20-25%  - CVP catheter removed; repeating IV lasix  - BP currently too soft for GDMT    3. H/o VT - rhythm stable on tele tpday - continue PO amio + quinidine   - EP following for consideration of device lead removal  For questions or updates, please contact [facility] Please consult [email or web address] for contact info under Cardiology/STEMI.      Stanly Leavens, MD FASE [facility] Cardiologist [facility]  [address] [place], [address] [place], [place] [address] [phone]  11:24 AM     "

## 2024-08-19 NOTE — Progress Notes (Signed)
 Rectal temperature 95.9, bear hugger placed on patient.  Low temperature related to sleeping in recliner directly beside windows in room for a duration of time.

## 2024-08-20 DIAGNOSIS — I472 Ventricular tachycardia, unspecified: Secondary | ICD-10-CM | POA: Diagnosis not present

## 2024-08-20 DIAGNOSIS — R7881 Bacteremia: Secondary | ICD-10-CM | POA: Diagnosis not present

## 2024-08-20 DIAGNOSIS — Z7189 Other specified counseling: Secondary | ICD-10-CM | POA: Diagnosis not present

## 2024-08-20 DIAGNOSIS — I10 Essential (primary) hypertension: Secondary | ICD-10-CM | POA: Diagnosis not present

## 2024-08-20 DIAGNOSIS — B9562 Methicillin resistant Staphylococcus aureus infection as the cause of diseases classified elsewhere: Secondary | ICD-10-CM | POA: Diagnosis not present

## 2024-08-20 DIAGNOSIS — Z515 Encounter for palliative care: Secondary | ICD-10-CM | POA: Diagnosis not present

## 2024-08-20 DIAGNOSIS — I5023 Acute on chronic systolic (congestive) heart failure: Secondary | ICD-10-CM | POA: Diagnosis not present

## 2024-08-20 DIAGNOSIS — E871 Hypo-osmolality and hyponatremia: Secondary | ICD-10-CM

## 2024-08-20 LAB — COMPREHENSIVE METABOLIC PANEL WITH GFR
ALT: 88 U/L — ABNORMAL HIGH (ref 0–44)
AST: 144 U/L — ABNORMAL HIGH (ref 15–41)
Albumin: 2.8 g/dL — ABNORMAL LOW (ref 3.5–5.0)
Alkaline Phosphatase: 208 U/L — ABNORMAL HIGH (ref 38–126)
Anion gap: 7 (ref 5–15)
BUN: 19 mg/dL (ref 8–23)
CO2: 28 mmol/L (ref 22–32)
Calcium: 9.1 mg/dL (ref 8.9–10.3)
Chloride: 99 mmol/L (ref 98–111)
Creatinine, Ser: 0.68 mg/dL (ref 0.44–1.00)
GFR, Estimated: >60 mL/min
Glucose, Bld: 112 mg/dL — ABNORMAL HIGH (ref 70–99)
Potassium: 3.7 mmol/L (ref 3.5–5.1)
Sodium: 134 mmol/L — ABNORMAL LOW (ref 135–145)
Total Bilirubin: 0.7 mg/dL (ref 0.0–1.2)
Total Protein: 6 g/dL — ABNORMAL LOW (ref 6.5–8.1)

## 2024-08-20 LAB — GLUCOSE, CAPILLARY
Glucose-Capillary: 115 mg/dL — ABNORMAL HIGH (ref 70–99)
Glucose-Capillary: 183 mg/dL — ABNORMAL HIGH (ref 70–99)
Glucose-Capillary: 94 mg/dL (ref 70–99)
Glucose-Capillary: 106 mg/dL — ABNORMAL HIGH (ref 70–99)

## 2024-08-20 LAB — MAGNESIUM: Magnesium: 2.1 mg/dL (ref 1.7–2.4)

## 2024-08-20 LAB — CBC
HCT: 37.8 % (ref 36.0–46.0)
Hemoglobin: 12.6 g/dL (ref 12.0–15.0)
MCH: 31 pg (ref 26.0–34.0)
MCHC: 33.3 g/dL (ref 30.0–36.0)
MCV: 92.9 fL (ref 80.0–100.0)
Platelets: 176 K/uL (ref 150–400)
RBC: 4.07 MIL/uL (ref 3.87–5.11)
RDW: 14.5 % (ref 11.5–15.5)
WBC: 10.8 K/uL — ABNORMAL HIGH (ref 4.0–10.5)
nRBC: 0 % (ref 0.0–0.2)

## 2024-08-20 LAB — VANCOMYCIN, PEAK: Vancomycin Pk: 27 ug/mL — ABNORMAL LOW (ref 30–40)

## 2024-08-20 MED ORDER — SPIRONOLACTONE 12.5 MG HALF TABLET
12.5000 mg | ORAL_TABLET | Freq: Every day | ORAL | Status: DC
  Administered 2024-08-20 – 2024-08-30 (×11): 12.5 mg via ORAL
  Filled 2024-08-20 (×4): qty 1

## 2024-08-20 NOTE — Assessment & Plan Note (Signed)
 Today renal function stable with serum cr at 0,58 with K at 4,1 and serum bicarbonate at 24 Na 134 and Mg 2,0  Continue with spironolactone  for RAAS inhibition  Follow up renal function

## 2024-08-20 NOTE — Progress Notes (Signed)
 " Cardiology Consultation:   Patient ID: Tanya Hogan MRN: [id number]; DOB: Nov 18, 1950  Admit date: 08/16/2024 Date of Consult: 08/20/2024  Primary Care Provider: [name], MD [facility] Cardiologist: Redell Shallow, MD  [facility] Electrophysiologist:  [name], MD   Patient Profile:   Tanya Hogan is a 73 y.o. female with a NICM/CHB, HFrEF likely in part 2/2 pacing-induced CM s/p LEFT MDT CRTP (RA/RV/CS-AIV lead) with subsequent addition of RV-LBaP lead (11/03/21) with subsequent addition of RV-d lead (05/31/24) for ATP for refractory VT, DM2, NASH cirrhosis who presented with lethargy and respiratory distress and is now found to have 4/4 cultures with MRSA.   Interval Events:   Spoke with family last night, reference note for discussion.  Spoke with patient again this morning about different options and she would like to avoid additional procedures after reviewing the risk, benefits and alternatives.  Past Medical History:  Diagnosis Date   Alkaline phosphatase elevation    Asthma    Breast cancer (HCC) 2011   Carpal tunnel syndrome    bilateral   Chronic HFrEF (heart failure with reduced ejection fraction) (HCC)    Cirrhosis, non-alcoholic (HCC)    Closed fracture of unspecified part of radius (alone)    DDD (degenerative disc disease), lumbar    Depression    pt denies   Diabetes mellitus without complication (HCC)    Enlarged heart    Fibromyalgia    GERD (gastroesophageal reflux disease)    History of kidney stones    HTN (hypertension)    Hypothyroidism    IBS (irritable bowel syndrome)    LBBB (left bundle branch block)    Microcytic anemia 05/14/2017   Mitral regurgitation    NICM (nonischemic cardiomyopathy) (HCC)    Pneumonia    Presence of permanent cardiac pacemaker    Pulmonary hypertension (HCC)    PVD (peripheral vascular disease)    2019   Sleep apnea    uses cpap   Tricuspid regurgitation    Ventricular  tachyarrhythmia [facility])    Past Surgical History:  Procedure Laterality Date   BIV ICD GENERATOR CHANGEOUT N/A 05/31/2024   Procedure: BIV ICD GENERATOR CHANGEOUT;  Surgeon: Primus Donnice DELENA, MD;  Location: [facility] INVASIVE CV LAB;  Service: Cardiovascular;  Laterality: N/A;   BIV PACEMAKER GENERATOR CHANGEOUT N/A 11/03/2021   Procedure: BIV PACEMAKER GENERATOR CHANGEOUT;  Surgeon: Waddell Danelle ORN, MD;  Location: [facility] INVASIVE CV LAB;  Service: Cardiovascular;  Laterality: N/A;   BREAST LUMPECTOMY Left 2011   COLONOSCOPY     FOOT SURGERY     KNEE SURGERY     LEAD INSERTION N/A 05/31/2024   Procedure: LEAD INSERTION;  Surgeon: Primus Donnice DELENA, MD;  Location: [facility] INVASIVE CV LAB;  Service: Cardiovascular;  Laterality: N/A;   LEAD REVISION/REPAIR N/A 11/03/2021   Procedure: LEAD REVISION/REPAIR;  Surgeon: Waddell Danelle ORN, MD;  Location: [facility] INVASIVE CV LAB;  Service: Cardiovascular;  Laterality: N/A;   MASTECTOMY Right    05/2019   PACEMAKER GENERATOR CHANGE N/A 08/23/2014   Procedure: PACEMAKER GENERATOR CHANGE;  Surgeon: Danelle ORN Waddell, MD;  Location: [facility] CATH LAB;  Service: Cardiovascular;  Laterality: N/A;   PACEMAKER PLACEMENT  2009   [place] [facility]   RECTAL SURGERY  1976   REDUCTION MAMMAPLASTY Left    05/2019   RIGHT/LEFT HEART CATH AND CORONARY ANGIOGRAPHY N/A 05/18/2024   Procedure: RIGHT/LEFT HEART CATH AND CORONARY ANGIOGRAPHY;  Surgeon: Wendel Lurena POUR, MD;  Location: [facility] INVASIVE  CV LAB;  Service: Cardiovascular;  Laterality: N/A;   TOTAL SHOULDER ARTHROPLASTY Right 03/10/2018   TOTAL SHOULDER ARTHROPLASTY Right 03/10/2018   Procedure: RIGHT TOTAL SHOULDER ARTHROPLASTY;  Surgeon: Dozier Soulier, MD;  Location: [facility] OR;  Service: Orthopedics;  Laterality: Right;   TUBAL LIGATION     UPPER EXTREMITY VENOGRAPHY  05/18/2024   Procedure: UPPER EXTREMITY VENOGRAPHY;  Surgeon: Wendel Lurena POUR, MD;  Location: [facility] INVASIVE CV LAB;  Service: Cardiovascular;;    Inpatient  Medications: Scheduled Meds:  amiodarone   200 mg Oral Daily   aspirin   81 mg Oral Daily   Chlorhexidine  Gluconate Cloth  6 each Topical Daily   heparin   5,000 Units Subcutaneous Q8H   insulin  aspart  0-5 Units Subcutaneous QHS   insulin  aspart  0-9 Units Subcutaneous TID WC   lactobacillus  1 g Oral TID WC   levothyroxine   112 mcg Oral Daily   multivitamin with minerals  1 tablet Oral Daily   mouth rinse  15 mL Mouth Rinse 4 times per day   pantoprazole   40 mg Oral QAC breakfast   pravastatin   40 mg Oral QHS   quiNIDine sulfate   200 mg Oral TID   sodium chloride  flush  3 mL Intravenous Q12H   sodium chloride  flush  3 mL Intravenous Q12H   Continuous Infusions:  vancomycin  1,000 mg (08/19/24 1806)   PRN Meds: acetaminophen  **OR** acetaminophen , levalbuterol , ondansetron  **OR** ondansetron  (ZOFRAN ) IV, mouth rinse, oxyCODONE , senna-docusate, traZODone   Allergies:   Allergies[1]  Social History:   Social History   Socioeconomic History   Marital status: Widowed    Spouse name: Not on file   Number of children: 2   Years of education: 14   Highest education level: Associate degree: academic program  Occupational History   Occupation: quality insurcance in sports coach    Comment: retired  Tobacco Use   Smoking status: Former    Current packs/day: 0.00    Types: Cigarettes    Quit date: 08/24/1966    Years since quitting: 58.0   Smokeless tobacco: Never  Vaping Use   Vaping status: Never Used  Substance and Sexual Activity   Alcohol  use: No   Drug use: No   Sexual activity: Not Currently  Other Topics Concern   Not on file  Social History Narrative   Retired. Lives with and helps her brother. Likes risk manager, nutritional therapist and music. Loves animals.   Social Drivers of Health   Tobacco Use: Medium Risk (08/16/2024)   Patient History    Smoking Tobacco Use: Former    Smokeless Tobacco Use: Never    Passive Exposure: Not on file  Financial Resource  Strain: Low Risk (07/07/2024)   Received from [facility]   Overall Financial Resource Strain (CARDIA)    How hard is it for you to pay for the very basics like food, housing, medical care, and heating?: Not hard at all  Food Insecurity: No Food Insecurity (08/16/2024)   Epic    Worried About Radiation Protection Practitioner of Food in the Last Year: Never true    Ran Out of Food in the Last Year: Never true  Transportation Needs: No Transportation Needs (08/16/2024)   Epic    Lack of Transportation (Medical): No    Lack of Transportation (Non-Medical): No  Recent Concern: Transportation Needs - Unmet Transportation Needs (08/09/2024)   Epic    Lack of Transportation (Medical): Yes    Lack of Transportation (Non-Medical): Yes  Physical Activity: Sufficiently Active (  08/10/2023)   Exercise Vital Sign    Days of Exercise per Week: 3 days    Minutes of Exercise per Session: 50 min  Recent Concern: Physical Activity - Insufficiently Active (07/12/2023)   Exercise Vital Sign    Days of Exercise per Week: 1 day    Minutes of Exercise per Session: 10 min  Stress: No Stress Concern Present (06/22/2024)   Received from [facility] - Occupational Stress Questionnaire    Do you feel stress - tense, restless, nervous, or anxious, or unable to sleep at night because your mind is troubled all the time - these days?: Not at all  Social Connections: Moderately Integrated (08/16/2024)   Social Connection and Isolation Panel    Frequency of Communication with Friends and Family: More than three times a week    Frequency of Social Gatherings with Friends and Family: More than three times a week    Attends Religious Services: More than 4 times per year    Active Member of [employer] or Organizations: Yes    Attends Banker Meetings: 1 to 4 times per year    Marital Status: Widowed  Intimate Partner Violence: Not At Risk (08/16/2024)   Epic    Fear of Current or  Ex-Partner: No    Emotionally Abused: No    Physically Abused: No    Sexually Abused: No  Depression (PHQ2-9): Low Risk (08/14/2024)   Depression (PHQ2-9)    PHQ-2 Score: 4  Alcohol  Screen: Low Risk (08/10/2023)   Alcohol  Screen    Last Alcohol  Screening Score (AUDIT): 0  Housing: Low Risk (08/16/2024)   Epic    Unable to Pay for Housing in the Last Year: No    Number of Times Moved in the Last Year: 0    Homeless in the Last Year: No  Utilities: Not At Risk (08/16/2024)   Epic    Threatened with loss of utilities: No  Recent Concern: Utilities - At Risk (07/07/2024)   Received from [facility]    In the past 12 months has the electric, gas, oil, or water  company threatened to shut off services in your home?: Yes  Health Literacy: Adequate Health Literacy (08/10/2023)   B1300 Health Literacy    Frequency of need for help with medical instructions: Never    Family History:    Family History  Problem Relation Age of Onset   Heart disease Mother    Melanoma Mother    Parkinson's disease Mother    Heart disease Father    Heart disease Brother    Heart failure Other    Breast cancer Other    Hypertension Other     ROS:  Review of Systems: [y] = yes, [ ]  = no      General: Weight gain [ ] ; Weight loss [ ] ; Anorexia [ ] ; Fatigue [ ] ; Fever [ ] ; Chills [ ] ; Weakness [ ]    Cardiac: Chest pain/pressure [ ] ; Resting SOB [ ] ; Exertional SOB [ ] ; Orthopnea [ ] ; Pedal Edema [ ] ; Palpitations [ ] ; Syncope [ ] ; Presyncope [ ] ; Paroxysmal nocturnal dyspnea [ ]    Pulmonary: Cough [ ] ; Wheezing [ ] ; Hemoptysis [ ] ; Sputum [ ] ; Snoring [ ]    GI: Vomiting [ ] ; Dysphagia [ ] ; Melena [ ] ; Hematochezia [ ] ; Heartburn [ ] ; Abdominal pain [ ] ; Constipation [ ] ; Diarrhea [ ] ; BRBPR [ ]   GU: Hematuria [ ] ;  Dysuria [ ] ; Nocturia [ ]  Vascular: Pain in legs with walking [ ] ; Pain in feet with lying flat [ ] ; Non-healing sores [ ] ; Stroke [ ] ; TIA [ ] ; Slurred speech [ ] ;   Neuro: Headaches  [ ] ; Vertigo [ ] ; Seizures [ ] ; Paresthesias [ ] ;Blurred vision [ ] ; Diplopia [ ] ; Vision changes [ ]    Ortho/Skin: Arthritis [ ] ; Joint pain [ ] ; Muscle pain [ ] ; Joint swelling [ ] ; Back Pain [ ] ; Rash [ ]    Psych: Depression [ ] ; Anxiety [ ]    Heme: Bleeding problems [ ] ; Clotting disorders [ ] ; Anemia [ ]    Endocrine: Diabetes [ ] ; Thyroid  dysfunction [ ]    Physical Exam/Data:   Vitals:   08/19/24 1942 08/20/24 0010 08/20/24 0348 08/20/24 0641  BP: 100/63 92/64 98/70    Pulse: 88 91 89   Resp: 20 20 19    Temp: 98 F (36.7 C) (!) 97.4 F (36.3 C) 97.6 F (36.4 C)   TempSrc: Oral Oral Oral   SpO2: 95% 99% 98%   Weight:    80.8 kg  Height:        Intake/Output Summary (Last 24 hours) at 08/20/2024 0659 Last data filed at 08/19/2024 2151 Gross per 24 hour  Intake 603 ml  Output --  Net 603 ml      08/20/2024    6:41 AM 08/19/2024    4:06 AM 08/18/2024    9:40 PM  Last 3 Weights  Weight (lbs) 178 lb 2.1 oz 178 lb 5.6 oz 176 lb 9.4 oz  Weight (kg) 80.8 kg 80.9 kg 80.1 kg     Body mass index is 33.66 kg/m.  General: tearful but conversant HEENT: normal Endocrine:  No thryomegaly Vascular: No carotid bruits; FA pulses 2+ bilaterally without bruits  Cardiac:  normal S1, S2; RRR; no murmur  Lungs: clear to auscultation bilaterally, no wheezing, rhonchi or rales  Abd: soft, nontender, no hepatomegaly  Ext: no edema Musculoskeletal:  No deformities, BUE and BLE strength normal and equal Skin: warm and dry, LEFT infraclavicular incision well healed without erythema or swelling  Neuro:  CNs 2-12 intact, no focal abnormalities noted Psych:  Normal affect   EKG:  The EKG was personally reviewed and demonstrates: None today  Telemetry:  Telemetry was personally reviewed and demonstrates: AP-BVP with frequent unifocal PVCs  Relevant CV Studies: TTE Result date: 08/17/24  1. Left ventricular ejection fraction, by estimation, is <20%. The left  ventricle has severely  decreased function. The left ventricle demonstrates  global hypokinesis. The left ventricular internal cavity size was severely  dilated. Left ventricular  diastolic parameters are consistent with Grade III diastolic dysfunction  (restrictive).   2. Right ventricular systolic function is moderately reduced. The right  ventricular size is moderately enlarged. There is moderately elevated  pulmonary artery systolic pressure. The estimated right ventricular  systolic pressure is 48.4 mmHg.   3. Left atrial size was severely dilated.   4. Right atrial size was severely dilated.   5. A small pericardial effusion is present. The pericardial effusion is  anterior to the right ventricle. There is no evidence of cardiac  tamponade.   6. The mitral valve is normal in structure. Trivial mitral valve  regurgitation. No evidence of mitral stenosis.   7. The aortic valve is tricuspid. There is mild calcification of the  aortic valve. Aortic valve regurgitation is not visualized. Aortic valve  sclerosis/calcification is present, without any evidence of aortic  stenosis.   8. The inferior vena cava is dilated in size with <50% respiratory  variability, suggesting right atrial pressure of 15 mmHg.    Laboratory Data:  High Sensitivity Troponin:   Recent Labs  Lab 08/02/24 1942 08/02/24 2142  TROPONINIHS 19* 21*     Chemistry Recent Labs  Lab 08/18/24 0557 08/19/24 1115 08/20/24 0340  NA 135 129* 134*  K 3.6 4.5 3.7  CL 100 98 99  CO2 26 22 28   GLUCOSE 123* 110* 112*  BUN 16 17 19   CREATININE 0.66 0.59 0.68  CALCIUM  8.9 9.4 9.1  GFRNONAA >60 >60 >60  ANIONGAP 8 9 7     Recent Labs  Lab 08/18/24 0557 08/19/24 1115 08/20/24 0340  PROT 6.3* 6.4* 6.0*  ALBUMIN 3.0* 2.9* 2.8*  AST 83* 133* 144*  ALT 54* 82* 88*  ALKPHOS 186* 204* 208*  BILITOT 1.0 1.1 0.7   Hematology Recent Labs  Lab 08/18/24 0557 08/19/24 1115 08/20/24 0340  WBC 14.8* 13.2* 10.8*  RBC 3.99 4.21 4.07   HGB 12.4 12.7 12.6  HCT 38.4 39.5 37.8  MCV 96.2 93.8 92.9  MCH 31.1 30.2 31.0  MCHC 32.3 32.2 33.3  RDW 14.6 14.5 14.5  PLT 147* 157 176   BNP Recent Labs  Lab 08/16/24 0707 08/17/24 0449 08/18/24 0557  PROBNP 27,035.0* 22,372.0* 10,332.0*    Radiology/Studies:  DG CHEST PORT 1 VIEW Result Date: 08/17/2024 CLINICAL DATA:  Central line placement. EXAM: PORTABLE CHEST 1 VIEW COMPARISON:  Radiograph yesterday FINDINGS: Placement of left internal jugular central line. The tip overlies multiple left-sided pacemaker wires in the region of the innominate/SVC confluence. No pneumothorax. Multi lead left-sided pacemaker in place. Stable cardiomegaly. Mild vascular congestion. No large pleural effusion. No focal airspace disease. Right shoulder arthroplasty. IMPRESSION: 1. Placement of left internal jugular central line with tip overlying the innominate/SVC confluence. No pneumothorax. 2. Stable cardiomegaly and vascular congestion. Electronically Signed   By: Andrea Gasman M.D.   On: 08/17/2024 17:54   ECHOCARDIOGRAM COMPLETE Result Date: 08/17/2024    ECHOCARDIOGRAM REPORT   Patient Name:   Tanya Hogan Date of Exam: 08/17/2024 Medical Rec #:  [id number]       Height:       61.0 in Accession #:    [id number]      Weight:       172.5 lb Date of Birth:  03-23-1951        BSA:          1.774 m Patient Age:    73 years        BP:           103/65 mmHg Patient Gender: F               HR:           89 bpm. Exam Location:  Inpatient Procedure: 2D Echo (Both Spectral and Color Flow Doppler were utilized during            procedure). Indications:    acute systolic chf  History:        Patient has prior history of Echocardiogram examinations, most                 recent 05/13/2024. Pacemaker and Defibrillator, Pulmonary HTN and                 Cirrhosis; Risk Factors:Diabetes, Hypertension and Sleep Apnea.  Sonographer:    Tinnie Barefoot RDCS Referring Phys: [phone] MANISH  J PATWARDHAN IMPRESSIONS  1.  Left ventricular ejection fraction, by estimation, is <20%. The left ventricle has severely decreased function. The left ventricle demonstrates global hypokinesis. The left ventricular internal cavity size was severely dilated. Left ventricular diastolic parameters are consistent with Grade III diastolic dysfunction (restrictive).  2. Right ventricular systolic function is moderately reduced. The right ventricular size is moderately enlarged. There is moderately elevated pulmonary artery systolic pressure. The estimated right ventricular systolic pressure is 48.4 mmHg.  3. Left atrial size was severely dilated.  4. Right atrial size was severely dilated.  5. A small pericardial effusion is present. The pericardial effusion is anterior to the right ventricle. There is no evidence of cardiac tamponade.  6. The mitral valve is normal in structure. Trivial mitral valve regurgitation. No evidence of mitral stenosis.  7. The aortic valve is tricuspid. There is mild calcification of the aortic valve. Aortic valve regurgitation is not visualized. Aortic valve sclerosis/calcification is present, without any evidence of aortic stenosis.  8. The inferior vena cava is dilated in size with <50% respiratory variability, suggesting right atrial pressure of 15 mmHg. FINDINGS  Left Ventricle: Left ventricular ejection fraction, by estimation, is <20%. The left ventricle has severely decreased function. The left ventricle demonstrates global hypokinesis. The left ventricular internal cavity size was severely dilated. There is no left ventricular hypertrophy. Left ventricular diastolic parameters are consistent with Grade III diastolic dysfunction (restrictive). Right Ventricle: The right ventricular size is moderately enlarged. No increase in right ventricular wall thickness. Right ventricular systolic function is moderately reduced. There is moderately elevated pulmonary artery systolic pressure. The tricuspid  regurgitant velocity  is 2.89 m/s, and with an assumed right atrial pressure of 15 mmHg, the estimated right ventricular systolic pressure is 48.4 mmHg. Left Atrium: Left atrial size was severely dilated. Right Atrium: Right atrial size was severely dilated. Pericardium: A small pericardial effusion is present. The pericardial effusion is anterior to the right ventricle. There is no evidence of cardiac tamponade. Mitral Valve: The mitral valve is normal in structure. Trivial mitral valve regurgitation. No evidence of mitral valve stenosis. Tricuspid Valve: The tricuspid valve is normal in structure. Tricuspid valve regurgitation is not demonstrated. No evidence of tricuspid stenosis. Aortic Valve: The aortic valve is tricuspid. There is mild calcification of the aortic valve. Aortic valve regurgitation is not visualized. Aortic valve sclerosis/calcification is present, without any evidence of aortic stenosis. Pulmonic Valve: The pulmonic valve was normal in structure. Pulmonic valve regurgitation is trivial. No evidence of pulmonic stenosis. Aorta: The aortic root is normal in size and structure. Venous: The inferior vena cava is dilated in size with less than 50% respiratory variability, suggesting right atrial pressure of 15 mmHg. IAS/Shunts: No atrial level shunt detected by color flow Doppler. Additional Comments: A device lead is visualized.  LEFT VENTRICLE PLAX 2D LVOT diam:     2.10 cm      Diastology LV SV:         32           LV e' medial:   9.57 cm/s LV SV Index:   18           LV E/e' medial: 10.1 LVOT Area:     3.46 cm  LV Volumes (MOD) LV vol d, MOD A2C: 266.0 ml LV vol d, MOD A4C: 179.0 ml LV vol s, MOD A2C: 231.0 ml LV vol s, MOD A4C: 163.0 ml LV SV MOD A2C:     35.0 ml LV SV  MOD A4C:     179.0 ml LV SV MOD BP:      31.3 ml RIGHT VENTRICLE          IVC RV Basal diam:  3.40 cm  IVC diam: 2.30 cm TAPSE (M-mode): 2.1 cm LEFT ATRIUM              Index        RIGHT ATRIUM           Index LA Vol (A2C):   116.0 ml 65.40 ml/m   RA Area:     22.80 cm LA Vol (A4C):   71.2 ml  40.14 ml/m  RA Volume:   68.80 ml  38.79 ml/m LA Biplane Vol: 96.8 ml  54.58 ml/m  AORTIC VALVE             PULMONIC VALVE LVOT Vmax:   67.70 cm/s  PR End Diast Vel: 8.18 msec LVOT Vmean:  39.900 cm/s LVOT VTI:    0.092 m  AORTA Ao Root diam: 3.00 cm Ao Asc diam:  3.20 cm MITRAL VALVE               TRICUSPID VALVE MV Area (PHT): 6.17 cm    TR Peak grad:   33.4 mmHg MV Decel Time: 123 msec    TR Vmax:        289.00 cm/s MV E velocity: 96.70 cm/s MV A velocity: 80.50 cm/s  SHUNTS MV E/A ratio:  1.20        Systemic VTI:  0.09 m                            Systemic Diam: 2.10 cm Toribio Fuel MD Electronically signed by Toribio Fuel MD Signature Date/Time: 08/17/2024/4:42:35 PM    Final    DG Chest 2 View Result Date: 08/16/2024 EXAM: 2 VIEW(S) XRAY OF THE CHEST 08/16/2024 07:25:00 AM COMPARISON: 08/14/2024 CLINICAL HISTORY: 73 year old female with shortness of breath. FINDINGS: LINES, TUBES AND DEVICES: Left chest ICD noted. LUNGS AND PLEURA: Progressed pulmonary vascularity. No edema. No confluent lung opacity. No pleural effusion. No pneumothorax. HEART AND MEDIASTINUM: Cardiomegaly. BONES AND SOFT TISSUES: Right shoulder arthroplasty noted. Multilevel degenerative changes of thoracic spine. No acute osseous abnormality. IMPRESSION: 1. No acute cardiopulmonary abnormality. Electronically signed by: Helayne Hurst MD 08/16/2024 07:30 AM EST RP Workstation: [id number]   Assessment and Plan:  ZAYLYNN RICKETT is a 73 y.o. female with a NICM/CHB, HFrEF likely in part 2/2 pacing-induced CM s/p LEFT MDT CRTP (RA/RV/CS-AIV lead) with subsequent addition of RV-LBaP lead (11/03/21) with subsequent addition of RV-d lead (05/31/24) for ATP for refractory VT, DM2, NASH cirrhosis who presented with lethargy and respiratory distress and is now found to have 4/4 cultures with MRSA.    NICM HFrEF End stage HF Refractory VT LEFT MDT CRTD NASH cirrhosis Spoke with  Mrs. Gorton's family last night about the different options.  Her daughter had expressed that she has always been aggressive with her medical care up to this point and that she likely would want to proceed with lead removal.  I detailed both options to her daughter and son and explained I would talk with Mrs. Creswell again this morning about her options.  At this point she is afebrile, normotensive and otherwise stable on IV antibiotics.  After a long conversation regarding her goals of care she would prioritize time outside of the hospital over ongoing procedures and potential prolonged recovery with  associated risk for complications.  We discussed that if we proceeded with lead extraction then she would likely have a month-long stay in the hospital and I do not know if it would significantly lengthen her life with multiple other comorbidities and end-stage heart failure.  If she is continued on antibiotics and treated with long-term suppressive antibiotics she would likely be able to discharge from hospital and hopefully not have immediate recurrence of bacteremia.  She understands that by not removing her device hardware now she is at future risk for endocarditis if and when the bacteremia recurs.  For now she is agreeable to avoid additional procedures and continue with antibiotics. She said she is tired and her goal is ultimately to get back home and be able to hold her great-grandchildren.  If she has immediate recurrence of bacteremia when off IV antibiotics then we can reconsider whether she is interested in device extraction.  Although TEE would guide management if we were proceeding with lead extraction would defer to infectious disease if they are planning on treating for 6 weeks either way or whether it will change their antibiotic duration.  If possible would like to minimize procedures with her degree of frailty.  For questions or updates, please contact [facility] Please consult  [email or web address] for contact info under   Signed, [name], MD  08/20/2024 6:59 AM     [1]  Allergies Allergen Reactions   Injectafer [Ferric Carboxymaltose] Shortness Of Breath    Mild SOB following injectafer infusion    Penicillins Anaphylaxis and Rash    Tolerated ceftin  2020   Accupril [Quinapril Hcl] Other (See Comments)    Hyperkalemia=high potassium level   Celebrex [Celecoxib] Other (See Comments)    Hx hematemesis, bleeding ulcer   Cozaar  [Losartan  Potassium] Itching   Dml Forte Rash   Entresto [Sacubitril-Valsartan] Other (See Comments)    Hyperkalemia   Flonase  [Fluticasone ] Other (See Comments)    Hx nasal ulcer   Lactose Intolerance (Gi) Diarrhea   Lipitor [Atorvastatin ] Other (See Comments)    Myalgias    Nasonex  [Mometasone  Furoate] Other (See Comments)    Epistaxis    Nsaids Other (See Comments)    Hx hematemesis, bleeding ulcer   Advil [Ibuprofen] Nausea And Vomiting    Hx hematemesis, bleeding ulcer   Baking Soda-Fluoride [Sodium Fluoride] Itching and Rash   Bayer Aspirin  [Aspirin ] Other (See Comments)    Hx hematemesis, bleeding ulcer   Chamomile Hives   Clindamycin /Lincomycin Rash   Codeine Nausea And Vomiting   Lasix  [Furosemide ] Other (See Comments)    Tinnitus  Hypotension   Miralax  [Polyethylene Glycol (Macrogol)] Other (See Comments)    Leaky gut syndrome   Quinine Rash   Reclast  [Zoledronic  Acid] Rash and Other (See Comments)    Tremors    Rifadin [Rifampin] Other (See Comments)    Unknown reaction   Singulair  [Montelukast ] Other (See Comments)    Dizziness    Sulfa Antibiotics Rash   Ultram  [Tramadol ] Anxiety and Rash   Vesicare  [Solifenacin ] Other (See Comments)    Urinary retention   Wellbutrin  [Bupropion ] Other (See Comments)    Myalgias  Skin crawling sensation   Wool Alcohol  [Lanolin] Rash   "

## 2024-08-20 NOTE — Consult Note (Signed)
 "                                                  Palliative Care Consult Note                                  Date: 08/20/2024   Patient Name: Tanya Hogan  DOB: Feb 05, 1951  MRN: [id number]  Age / Sex: 73 y.o., female  PCP: Tanya Dorothyann BIRCH, MD Referring Physician: Noralee Elidia Toribio Hogan  Reason for Consultation: Establishing goals of care  HPI/Patient Profile: 73 y.o. female  with past medical history of recent revision of pacemaker/AICD, breast cancer, end stage chronic heart failure, nonalcoholic cirrhosis, BiV pacemake, depression, DM2, GERD, HTN, hypothyroidism, valvular heart disease, pulmonary hypertension, sleep apnea and ventricular tachy arrhythmia  admitted on 08/16/2024 with swelling and difficulty breathing.   Admitted for acute on chronic HF. On 12/25 she became minimally responsive requiring BiPAP. She was found to have MRSA bacteremia.  She has six inpatient hospitalizations in the last six months. Last seen by PMT 05/2024.  Past Medical History:  Diagnosis Date   Alkaline phosphatase elevation    Asthma    Breast cancer (HCC) 2011   Carpal tunnel syndrome    bilateral   Chronic HFrEF (heart failure with reduced ejection fraction) (HCC)    Cirrhosis, non-alcoholic (HCC)    Closed fracture of unspecified part of radius (alone)    DDD (degenerative disc disease), lumbar    Depression    pt denies   Diabetes mellitus without complication (HCC)    Enlarged heart    Fibromyalgia    GERD (gastroesophageal reflux disease)    History of kidney stones    HTN (hypertension)    Hypothyroidism    IBS (irritable bowel syndrome)    LBBB (left bundle branch block)    Microcytic anemia 05/14/2017   Mitral regurgitation    NICM (nonischemic cardiomyopathy) (HCC)    Pneumonia    Presence of permanent cardiac pacemaker    Pulmonary hypertension (HCC)    PVD (peripheral vascular disease)    2019   Sleep apnea    uses cpap   Tricuspid regurgitation     Ventricular tachyarrhythmia (HCC)     Subjective:   I have reviewed medical records including EPIC notes, labs and imaging, received update from attending provider, assessed the patient and then met with her to discuss diagnosis prognosis, GOC, EOL wishes, disposition and options.  I introduced Palliative Medicine as specialized medical care for people living with serious illness. It focuses on providing relief from symptoms and stress of a serious illness. The goal is to improve quality of life for both the patient and the family.  Today's Discussion: Patient sitting up in chair. Assisted her to bedside commode. She shared she has had a good day today. She was able to walk in the hallway. No family at bedside. Asked patient if she would like to include her children in goals of care discussions-- she declined today.  We discussed the patient's chronic conditions including her end stage heart failure and arrhythmias. We discussed her current hospitalization and MRSA infection. She tells me Dr. Almetta spent a lot of time over the last two days talking to her and her children about options moving forward.  After much consideration, she has decided to avoid additional procedures such as lead extraction and will likely discharge home on antibiotics. She tells me her quality of life and time spent with family (and her new great grandchild) are more important than her quantity of life. She tells me there is concern from her daughter about having the equipment to care for her and worry that the daughter may not be able to help with all the IV antibiotics (if she is discharged home on IV abx). I shared this concern with primary team.  A discussion was had today regarding advanced directives. Patient has begun completing HCPOA paperwork naming her daughter Tanya Hogan as proxy management consultant. [place] consult has already been placed for assistance. She does not have a living will. We discussed code status and  scopes of care. Recommended consideration of DNR status, understanding evidenced-based poor outcomes in similar hospitalized patients, as the cause of the arrest is likely associated with chronic/terminal disease rather than a reversible acute cardio-pulmonary event. Patient became tearful. She tells me she thinks she would not want compressions or intubation. She is considering changing to DNR/DNI but would like to discuss further with her daughter. Patient remains full code and full scope of care. She does share she would not want a permanent feeding tube or to be in a nursing home.  Prior to this admission the patient lived at home with her brother. She took care of him until recently when she became too fatigued and her daughter started assisting. The patient shared she has become very fatigued and her appetite has been poor. She tells me she is tired after twenty five years of poor health.  Discussed the importance of continued conversation with family and the medical providers regarding overall plan of care and treatment options, ensuring decisions are within the context of the patient's values and GOCs.  Emotional support and therapeutic listening provided. Questions and concerns were addressed. Hard Choices booklet left for review. The family was encouraged to call with questions or concerns. PMT will continue to support holistically.  Review of Systems  Constitutional:  Positive for appetite change and fatigue.  Respiratory:  Positive for shortness of breath.   Neurological:  Positive for weakness.    Objective:   Primary Diagnoses: Present on Admission:  Ventricular tachycardia, unspecified (HCC)  COPD (chronic obstructive pulmonary disease) (HCC)  Essential hypertension  Gastroesophageal reflux disease  Hypothyroidism  Irritable bowel syndrome with constipation  Microcytic anemia  NASH (nonalcoholic steatohepatitis)  Obesity, Class I, BMI 30-34.9  Type 2 diabetes mellitus with  hyperlipidemia (HCC)   Physical Exam Vitals reviewed.  HENT:     Head: Normocephalic and atraumatic.  Cardiovascular:     Rate and Rhythm: Normal rate.  Pulmonary:     Effort: Pulmonary effort is normal.  Skin:    General: Skin is warm and dry.  Neurological:     Mental Status: She is alert and oriented to person, place, and time.  Psychiatric:        Mood and Affect: Affect is tearful.     Vital Signs:  BP 122/61 (BP Location: Left Arm)   Pulse 88   Temp 98 F (36.7 C) (Oral)   Resp 19   Ht 5' 1 (1.549 m)   Wt 80.8 kg   SpO2 99%   BMI 33.66 kg/m    Advanced Care Planning:   Existing Vynca/ACP Documentation: None  Primary Decision Maker: PATIENT  Code Status/Advance Care Planning: Full code  Assessment & Plan:   SUMMARY OF RECOMMENDATIONS   Continue full code and scope Patient considering limitations to code status and scope of care- wants to discuss with daughter first Avoid procedures including lead extraction- continue antibiotics [place] consult for HCPOA creation- daughter as proxy Continued PMT support  Discussed with: bedside RN and Dr. Noralee  Time Total: 90 minutes    Thank you for allowing us  to participate in the care of Tanya Hogan PMT will continue to support holistically.   Signed by: Stephane Palin, NP Palliative Medicine Team  Team Phone # [phone] (Nights/Weekends)  08/20/2024, 4:40 PM   "

## 2024-08-20 NOTE — Progress Notes (Signed)
 Mobility Specialist Progress Note:    08/20/24 1500  Mobility  Activity Ambulated with assistance  Level of Assistance Contact guard assist, steadying assist  Assistive Device Front wheel Piazza  Distance Ambulated (ft) 200 ft  Activity Response Tolerated well  Mobility Referral Yes  Mobility visit 1 Mobility  Mobility Specialist Start Time (ACUTE ONLY) 1412  Mobility Specialist Stop Time (ACUTE ONLY) 1433  Mobility Specialist Time Calculation (min) (ACUTE ONLY) 21 min   Received pt in chair having no complaints and agreeable to mobility. Pt was asymptomatic throughout ambulation and returned to room w/o fault. Left in chair w/ call bell in reach and all needs met.   Thersia Minder Mobility Specialist  Please contact vis Secure Chat or  Rehab Office [phone]

## 2024-08-20 NOTE — Progress Notes (Signed)
 Chaplain consult order placed per patient request.

## 2024-08-20 NOTE — Progress Notes (Addendum)
 " Progress Note   Patient: Tanya Hogan FMW:[id number] DOB: [birth year] DOA: 08/16/2024     4 DOS: the patient was seen and examined on 08/20/2024   Brief hospital course: Tanya Hogan was admitted to the hospital with the working diagnosis of MRSA bacteremia.    73 yo female with the past medical history of heart failure, hypertension, chronic anemia, peripheral vascular disease, T2DM, hypothyroidism and obesity class 1 who presented with dyspnea. Recent hospitalization 12/10 to 08/08/24 for heart failure exacerbation. She was diuresed about 6 L and was discharged on oral torsemide  20 mg daily with instructions to increase the dose to 40 mg in case of volume overload.  Unfortunately at home she developed recurrent dyspnea and lower extremity edema prompting her to come back to the ED.  On his initial physical examination her blood pressure was 93/51, HR 89, RR 35 and 02 saturation 89%  Lungs with no wheezing or rhonchi, heart with S1 and S2 present and regular with no gallops or rubs, abdomen with no distention and trace lower extremity edema.   Patient was placed on IV bumetanide  for diuresis.   12/25 with lethargy, poorly response to IV loop diuretics. Place on Bipap for increased work of breathing. Transferred to ICU and placed central venous catheter.  MRSA bacteremia, left lower lobe infiltrate, likely pneumonia present on admission. Placed on antibiotic therapy  12/26 consulted EP with recommendations to extract pacemaker leads.  12/27 transferred to [facility].  12/28 patient poor candidate for pacer leads extraction, end stage heart failure and poor prognosis. Patient will need chronic suppressive therapy with antibiotics. Consult palliative care.   Assessment and Plan: * MRSA bacteremia 2 out of 2 bottles positive for MRSA.  Wbc 13.3  Possible left lower lobe pneumonia   Patient has been on antibiotic therapy with vancomycin   Follow up on repeat blood cultures with positive gram  positive cocci in clusters.   Patient is very high risk for lead explantation, she would like to continue with suppressive antibiotics.  She has very poor prognosis in the setting of end stage heart failure  Follow up with ID recommendations  Will consult palliative care.   Acute on chronic systolic CHF (congestive heart failure) (HCC) Echocardiogram with reduced LV systolic function EF <20%, global hypokinesis, severe dilatation of LV cavity, grade III diastolic dysfunction (restrictive), RV systolic function with moderate reduction, RVSP 48.8 mmHg,  LA and RA with severe dilatation, small pericardial effusion   Systolic blood pressure 100 mmHg range   Holding guideline directed medical therapy due to risk of hypotension  Will resume spironolactone  for RAAS inhibition   Essential hypertension Continue to hold on antihypertensive agents due to risk of hypotension Continue blood pressure monitoring   Ventricular tachycardia, unspecified (HCC) Refractory VT, end stage heart failure  On amiodarone  and quinidine.  SP CRT D  Type 2 diabetes mellitus with hyperlipidemia (HCC) Continue glucose cover and monitoring with insulin  sliding scale Continue with statin therapy   NASH (nonalcoholic steatohepatitis) Stable with no signs of decompensation   Gastroesophageal reflux disease Continue pantoprazole    COPD (chronic obstructive pulmonary disease) (HCC) No signs of exacerbation  Continue bronchodilator therapy Continue oxymetry monitoring   Hypothyroidism Continue with levothyroxine    Irritable bowel syndrome with constipation Continue home bowel regimen Currently stable  History of left breast cancer In remission follow-up as an outpatient  Microcytic anemia Positive iron deficiency anemia Cell count has been stable   Hyponatremia Renal function with serum cr at 0,68 with  K at 3,7 and serum bicarbonate at 28  Na 134  Plan to continue close follow up renal function and  electrolytes in am Resume spironolactone  for RAAS inhibition   Obesity, Class I, BMI 30-34.9 Calculated BMI is 33.7         Subjective: Patient with no chest pain, dyspnea is improving, no nausea or vomiting. Out of bed, no PND or lower extremity edema   Physical Exam: Vitals:   08/20/24 0010 08/20/24 0348 08/20/24 0641 08/20/24 0833  BP: 92/64 98/70  109/73  Pulse: 91 89  88  Resp: 20 19  17   Temp: (!) 97.4 F (36.3 C) 97.6 F (36.4 C)  98 F (36.7 C)  TempSrc: Oral Oral  Oral  SpO2: 99% 98%  96%  Weight:   80.8 kg   Height:       Neurology awake and alert ENT with mild pallor with no icterus Cardiovascular with S1 and S2 present and regular with no gallops, rubs or murmurs Respiratory with no rales or wheezing, no rhonchi Abdomen with no distention, soft and non tender No lower extremity edema   Data Reviewed:    Family Communication: no family at the bedside   Disposition: Status is: Inpatient Remains inpatient appropriate because: IV antibiotics and palliative care consultation   Planned Discharge Destination: Home     Author: Elidia Toribio Furnace, MD 08/20/2024 9:53 AM  For on call review [email or web address].  "

## 2024-08-21 DIAGNOSIS — I5021 Acute systolic (congestive) heart failure: Secondary | ICD-10-CM | POA: Diagnosis not present

## 2024-08-21 DIAGNOSIS — Z7189 Other specified counseling: Secondary | ICD-10-CM | POA: Diagnosis not present

## 2024-08-21 DIAGNOSIS — I472 Ventricular tachycardia, unspecified: Secondary | ICD-10-CM | POA: Diagnosis not present

## 2024-08-21 DIAGNOSIS — I10 Essential (primary) hypertension: Secondary | ICD-10-CM | POA: Diagnosis not present

## 2024-08-21 DIAGNOSIS — K7581 Nonalcoholic steatohepatitis (NASH): Secondary | ICD-10-CM | POA: Diagnosis not present

## 2024-08-21 DIAGNOSIS — E1169 Type 2 diabetes mellitus with other specified complication: Secondary | ICD-10-CM | POA: Diagnosis not present

## 2024-08-21 DIAGNOSIS — E785 Hyperlipidemia, unspecified: Secondary | ICD-10-CM | POA: Diagnosis not present

## 2024-08-21 DIAGNOSIS — I5023 Acute on chronic systolic (congestive) heart failure: Secondary | ICD-10-CM | POA: Diagnosis not present

## 2024-08-21 DIAGNOSIS — B9562 Methicillin resistant Staphylococcus aureus infection as the cause of diseases classified elsewhere: Secondary | ICD-10-CM | POA: Diagnosis not present

## 2024-08-21 DIAGNOSIS — Z515 Encounter for palliative care: Secondary | ICD-10-CM | POA: Diagnosis not present

## 2024-08-21 DIAGNOSIS — K219 Gastro-esophageal reflux disease without esophagitis: Secondary | ICD-10-CM | POA: Diagnosis not present

## 2024-08-21 DIAGNOSIS — R7881 Bacteremia: Secondary | ICD-10-CM | POA: Diagnosis not present

## 2024-08-21 LAB — COMPREHENSIVE METABOLIC PANEL WITH GFR
ALT: 77 U/L — ABNORMAL HIGH (ref 0–44)
AST: 114 U/L — ABNORMAL HIGH (ref 15–41)
Albumin: 2.8 g/dL — ABNORMAL LOW (ref 3.5–5.0)
Alkaline Phosphatase: 206 U/L — ABNORMAL HIGH (ref 38–126)
Anion gap: 9 (ref 5–15)
BUN: 13 mg/dL (ref 8–23)
CO2: 24 mmol/L (ref 22–32)
Calcium: 9.5 mg/dL (ref 8.9–10.3)
Chloride: 101 mmol/L (ref 98–111)
Creatinine, Ser: 0.58 mg/dL (ref 0.44–1.00)
GFR, Estimated: >60 mL/min
Glucose, Bld: 108 mg/dL — ABNORMAL HIGH (ref 70–99)
Potassium: 4.1 mmol/L (ref 3.5–5.1)
Sodium: 134 mmol/L — ABNORMAL LOW (ref 135–145)
Total Bilirubin: 0.6 mg/dL (ref 0.0–1.2)
Total Protein: 6 g/dL — ABNORMAL LOW (ref 6.5–8.1)

## 2024-08-21 LAB — MAGNESIUM: Magnesium: 2 mg/dL (ref 1.7–2.4)

## 2024-08-21 LAB — CULTURE, BLOOD (ROUTINE X 2)

## 2024-08-21 LAB — GLUCOSE, CAPILLARY
Glucose-Capillary: 119 mg/dL — ABNORMAL HIGH (ref 70–99)
Glucose-Capillary: 115 mg/dL — ABNORMAL HIGH (ref 70–99)
Glucose-Capillary: 112 mg/dL — ABNORMAL HIGH (ref 70–99)
Glucose-Capillary: 123 mg/dL — ABNORMAL HIGH (ref 70–99)

## 2024-08-21 NOTE — Progress Notes (Signed)
 Notified respiratory patient requests bipap assistance. Patient resting comfortably sitting upright in chair.

## 2024-08-21 NOTE — Progress Notes (Signed)
 Mobility   08/21/24 0930  Mobility  Activity Ambulated with assistance  Level of Assistance Standby assist, set-up cues, supervision of patient - no hands on  Assistive Device Front wheel [place]  Distance Ambulated (ft) 200 ft  Activity Response Tolerated well  Mobility Referral Yes  Mobility visit 1 Mobility  Mobility Specialist Start Time (ACUTE ONLY) 0930  Mobility Specialist Stop Time (ACUTE ONLY) 0945  Mobility Specialist Time Calculation (min) (ACUTE ONLY) 15 min   Pt in chair upon arrival, agreeable to mobility. Required no physical assistance during ambulation, SV for safety. VSS throughout and no c/o when asked. Pt returned to chair and left with all needs met, call bell in reach.   Lauraine Erm Mobility Specialist Please contact via SecureChat or [employer] [phone]

## 2024-08-21 NOTE — Progress Notes (Signed)
 "                                                                                                                                                                                                          Daily Progress Note   Patient Name: Tanya Hogan       Date: 08/21/2024 DOB: [birth year]  Age: 73 y.o. MRN#: [id number] Attending Physician: Noralee Elidia Toribio DEWAINE Primary Care Physician: [name], MD Admit Date: 08/16/2024  Reason for Consultation/Follow-up: Establishing goals of care   Length of Stay: 5  Current Medications: Scheduled Meds:   amiodarone   200 mg Oral Daily   aspirin   81 mg Oral Daily   Chlorhexidine  Gluconate Cloth  6 each Topical Daily   heparin   5,000 Units Subcutaneous Q8H   insulin  aspart  0-5 Units Subcutaneous QHS   insulin  aspart  0-9 Units Subcutaneous TID WC   lactobacillus  1 g Oral TID WC   levothyroxine   112 mcg Oral Daily   multivitamin with minerals  1 tablet Oral Daily   mouth rinse  15 mL Mouth Rinse 4 times per day   pantoprazole   40 mg Oral QAC breakfast   pravastatin   40 mg Oral QHS   quiNIDine sulfate   200 mg Oral TID   sodium chloride  flush  3 mL Intravenous Q12H   sodium chloride  flush  3 mL Intravenous Q12H   spironolactone   12.5 mg Oral Daily    Continuous Infusions:  vancomycin  1,000 mg (08/20/24 1735)    PRN Meds: acetaminophen  **OR** acetaminophen , levalbuterol , ondansetron  **OR** ondansetron  (ZOFRAN ) IV, mouth rinse, oxyCODONE , senna-docusate, traZODone   Physical Exam Vitals reviewed.  Constitutional:      General: She is not in acute distress. HENT:     Head: Normocephalic and atraumatic.  Cardiovascular:     Rate and Rhythm: Normal rate.  Pulmonary:     Effort: Pulmonary effort is normal.  Skin:    General: Skin is dry.  Neurological:     Mental Status: She is alert and oriented to person, place, and time.  Psychiatric:        Behavior: Behavior normal.     Comments: forgetful              Vital Signs: BP (!) 107/56   Pulse 89   Temp 97.6 F (36.4 C)   Resp 19   Ht 5' 1 (1.549 m)   Wt 80.8 kg   SpO2 96%   BMI 33.66 kg/m  SpO2: SpO2: 96 % O2 Device: O2 Device: Room Air O2  Flow Rate: O2 Flow Rate (L/min): 2 L/min     Patient Active Problem List   Diagnosis Date Noted   Hyponatremia 08/20/2024   MRSA bacteremia 08/19/2024   Sepsis (HCC) 08/17/2024   Acute on chronic systolic CHF (congestive heart failure) (HCC) 08/16/2024   Hyperlipidemia 08/16/2024   Leukocytosis 08/16/2024   Phlebitis 08/08/2024   History of ventricular tachycardia 08/03/2024   Obesity, Class I, BMI 30-34.9 08/03/2024   Moderate episode of recurrent major depressive disorder (HCC) 07/24/2024   Hordeolum externum of right eye 07/15/2024   Ventricular tachycardia (HCC) 05/28/2024   SVT (supraventricular tachycardia) 05/13/2024   Sensation as if bladder still full 11/23/2023   Fracture of base of fifth metatarsal bone of left foot 11/02/2023   Presence of heart assist device (HCC) 10/07/2023   Primary osteoarthritis of left shoulder 09/08/2023   Nonischemic cardiomyopathy (HCC) 02/16/2023   Stress and adjustment reaction 04/08/2022   Gastroesophageal reflux disease 02/16/2022   Ventricular tachycardia, unspecified (HCC) 10/24/2021   COPD (chronic obstructive pulmonary disease) (HCC) 09/21/2021   Osteoporosis 02/28/2021   Primary osteoarthritis of both knees 01/14/2021   Irritable bowel syndrome with constipation 09/16/2020   Aortic atherosclerosis 06/11/2020   Benign lipomatous neoplasm of skin and subcutaneous tissue of left leg 06/11/2020   NSVT (nonsustained ventricular tachycardia) (HCC) 02/14/2020   Genetic testing 08/14/2019   Ductal carcinoma in situ (DCIS) of right breast 06/15/2019   Arthralgia 04/11/2019   DDD (degenerative disc disease), cervical 11/19/2017   Peripheral edema 05/19/2017   Microcytic anemia 05/14/2017   Delayed sleep phase syndrome 10/29/2016   Primary  osteoarthritis of left ankle 09/24/2016   History of arthroplasty of right shoulder 02/24/2016   Bilateral foot pain 01/22/2016   NASH (nonalcoholic steatohepatitis) 05/26/2015   Nasal septal perforation 07/11/2014   Controlled diabetes mellitus type 2 with complications (HCC) 05/25/2014   Physical deconditioning 04/24/2014   OSA (obstructive sleep apnea) 06/08/2013   Chronic systolic heart failure (HCC) 05/23/2013   Asthma, moderate persistent 04/18/2013   Chronic cough 04/18/2013   History of left breast cancer 03/18/2010   Type 2 diabetes mellitus with hyperlipidemia (HCC) 11/07/2009   Fibromyalgia 10/29/2009   Biventricular cardiac pacemaker in situ 10/23/2009   Hypothyroidism 10/03/2009   Essential hypertension 07/02/2009   Chronic back pain 07/02/2009    Palliative Care Assessment & Plan   Patient Profile: 73 y.o. female  with past medical history of recent revision of pacemaker/AICD, breast cancer, end stage chronic heart failure, nonalcoholic cirrhosis, BiV pacemake, depression, DM2, GERD, HTN, hypothyroidism, valvular heart disease, pulmonary hypertension, sleep apnea and ventricular tachy arrhythmia  admitted on 08/16/2024 with swelling and difficulty breathing.    Admitted for acute on chronic HF. On 12/25 she became minimally responsive requiring BiPAP. She was found to have MRSA bacteremia.   She has six inpatient hospitalizations in the last six months. Last seen by PMT 05/2024.  Today's Discussion: Reviewed chart. Patient sitting up in chair eating italian ice and speaking to cardiologist. Patient appears comfortable and says she is feeling good. No family at bedside.  We reviewed our discussion yesterday regarding goals of care. Patient began reading the Hard Choices booklet. We reviewed what do not resuscitate means and how it differs from a person's scope of care. We called patient's daughter to discuss goals of care. Patient confirmed she would not want an  attempted resuscitation-- changed to DNR. She would not want intubation-- changed to DNI. Her daughter is in agreement with these changes. Patient shared  she would not want a permanent feeding tube and prefers to not go to a nursing facility.   Patient would like to discharge home to her daughter's home. Patient's daughter shared concern that she works 10 hours a day while also taking care of the patient's brother. She is concerned with the patient being alone for so long during the day. Tanya Hogan is also concerned about the IV antibiotic schedule and if she will be available at the required times. Patient and daughter will see how patient progresses with therapies and mobility team.  Shared these concerns with provider.   Discussed the importance of continued conversation with family and the medical providers regarding overall plan of care and treatment options, ensuring decisions are within the context of the patient's values and GOCs.  Emotional support and therapeutic listening provided. Encouraged patient and family to contact PMT with needs. PMT will continue to support.  Recommendations/Plan: Changed to DNR/DNI Avoid procedures including lead extraction- continue antibiotics No feeding tube Patient would like to discharge to daughters home- daughter shared some concerns [place] consult for HCPOA creation- daughter as proxy Continued PMT support   Code Status:    Code Status Orders  (From admission, onward)           Start     Ordered   08/21/24 1242  Do not attempt resuscitation (DNR)- Limited -Do Not Intubate (DNI)  (Code Status)  Continuous       Question Answer Comment  If pulseless and not breathing No CPR or chest compressions.   In Pre-Arrest Conditions (Patient Is Breathing and Has A Pulse) Do not intubate. Provide all appropriate non-invasive medical interventions. Avoid ICU transfer unless indicated or required.   Consent: Discussion documented in EHR or advanced directives  reviewed      08/21/24 1241         Extensive chart review has been completed prior to seeing the patient including labs, vital signs, imaging, progress/consult notes, orders, medications, and available advance directive documents  Care plan was discussed with bedside RN and Dr. Noralee  Time spent: 60 minutes  Thank you for allowing the Palliative Medicine Team to assist in the care of this patient.   Stephane CHRISTELLA Palin, NP  Please contact Palliative Medicine Team phone at [phone] for questions and concerns.       "

## 2024-08-21 NOTE — Progress Notes (Signed)
 This chaplain responded to PMT NP-Dawn consult for creating the Pt. Advance Directive: HCPOA and Living Will in the setting of the Pt. major chronic illness and goals of care. The Pt. is sitting in the bedside recliner at the time of the visit. Family is not visiting.  The chaplain listened reflectively to the Pt. share how wisdom informed her decision for changing her code status to DNR. The chaplain observed the Pt. peace in her decision and how today's decision about code status informed other decisions for the Pt.  The Pt. reviewed her AD with the chaplain. AD education was completed and the AD: HCPOA and Living Will was updated on the new document. The chaplain left the document with the Pt. for a notary visit on Tuesday. The Pt. shared her desire to complete ACP and not leave the burden of medical decision making on her family.   This chaplain is available for F/U spiritual care as needed.  Chaplain Leeroy Hummer [phone]

## 2024-08-21 NOTE — Progress Notes (Signed)
 "        [facility]  Date of Admission:  08/16/2024     Assessment: 73 year old female with past medical history of NICM with heart failure EF 20 to 25% status post defibrillator, last replaced on 06/10/2024, NASH cirrhosis, diabetes type 2, hypertension, OSA on BiPAP, IBS presenting with bilateral lower extremity edema with some shortness of breath admitted with heart failure found to have   #MRSA bacteremia in the setting of AICD with recent upgrade on 06/10/2024 - On 06/10/24 patient recent upgraded of CRT-P to CRT-D,  additional RV lead palced - Chest x-ray on admission showed no acute abnormality, WBC 16K.  Patient afebrile. - On 12/25 found to be minimally responsive transferred to ICU.  Central line placed.  Blood cultures(obtained about half an hour after central line placement) from 12/25 grew MRSA.  Infectious disease engaged. - Denies any overt signs/erythema.  Patient denies any new aches or pains.  No tenderness/erythema noted at AICD site -- no sign/sx of metastatic foci of infection   Micro: 12/25, 12/27 bcx mrsa 12/29 bcx in progress   Patient poor candidate for pacer leads extraction in setting cardiomyopathy So far blood cx not sterilized yet ?pacer endocarditis or other focus of deep abscess -- needs w/u  At this time tee can be deferred given nonsurgical candidate, unless persistent bacteremia    #Diabetes mellitus #NASH cirrhosis #Heart failure with reduced ejection fraction  Recommendations: - continue vancomycin  -- repeat bcx today -- if persistent bcx will rescan abd pelv and repeat tte or get tee and [facility] cardiology or discuss goals of care -- maintain contact isolation precaution -- discussed with primary team    Subjective: No complains, want to go home No focal pain Afebrile No leukocytosis Changed goals of care to dnr  Ros: All other ros negative  Microbiology:   Antibiotics:  vanc 12/26-c     Cultures: Blood 12/25, 12/27 MRSA 12/29 in process  Urine   Other  Principal Problem:   MRSA bacteremia Active Problems:   History of left breast cancer   Hypothyroidism   Type 2 diabetes mellitus with hyperlipidemia (HCC)   Essential hypertension   NASH (nonalcoholic steatohepatitis)   Microcytic anemia   Irritable bowel syndrome with constipation   Ventricular tachycardia, unspecified (HCC)   Gastroesophageal reflux disease   COPD (chronic obstructive pulmonary disease) (HCC)   Obesity, Class I, BMI 30-34.9   Acute on chronic systolic CHF (congestive heart failure) (HCC)   Hyponatremia   Allergies[1]  Scheduled Meds:  amiodarone   200 mg Oral Daily   aspirin   81 mg Oral Daily   Chlorhexidine  Gluconate Cloth  6 each Topical Daily   heparin   5,000 Units Subcutaneous Q8H   insulin  aspart  0-5 Units Subcutaneous QHS   insulin  aspart  0-9 Units Subcutaneous TID WC   lactobacillus  1 g Oral TID WC   levothyroxine   112 mcg Oral Daily   multivitamin with minerals  1 tablet Oral Daily   mouth rinse  15 mL Mouth Rinse 4 times per day   pantoprazole   40 mg Oral QAC breakfast   pravastatin   40 mg Oral QHS   quiNIDine sulfate   200 mg Oral TID   sodium chloride  flush  3 mL Intravenous Q12H   sodium chloride  flush  3 mL Intravenous Q12H   spironolactone   12.5 mg Oral Daily   Continuous Infusions:  vancomycin  1,000 mg (08/21/24 1725)   PRN Meds:.acetaminophen  **OR** acetaminophen , levalbuterol , ondansetron  **OR**  ondansetron  (ZOFRAN ) IV, mouth rinse, oxyCODONE , senna-docusate, traZODone     OBJECTIVE: Vitals:   08/21/24 0837 08/21/24 1306 08/21/24 1605 08/21/24 1606  BP: (!) 107/56 (!) 110/54 106/65   Pulse: 89 88 96 88  Resp: 19 20 (!) 23 (!) 22  Temp: 97.6 F (36.4 C) 97.6 F (36.4 C) 98.1 F (36.7 C)   TempSrc:  Axillary Oral   SpO2: 96% 94% 97% 95%  Weight:      Height:       Body mass index is 33.66 kg/m.  Physical Exam General/constitutional: no  distress, pleasant HEENT: Normocephalic, PER, Conj Clear, EOMI, Oropharynx clear Neck supple CV: rrr no mrg -- pacer icd site no tenderness Lungs: clear to auscultation, normal respiratory effort Abd: Soft, Nontender Ext: no edema Skin: No Rash Neuro: nonfocal MSK: no peripheral joint swelling/tenderness/warmth; back spines nontender   Lab Results Lab Results  Component Value Date   WBC 10.8 (H) 08/20/2024   HGB 12.6 08/20/2024   HCT 37.8 08/20/2024   MCV 92.9 08/20/2024   PLT 176 08/20/2024    Lab Results  Component Value Date   CREATININE 0.58 08/21/2024   BUN 13 08/21/2024   NA 134 (L) 08/21/2024   K 4.1 08/21/2024   CL 101 08/21/2024   CO2 24 08/21/2024    Lab Results  Component Value Date   ALT 77 (H) 08/21/2024   AST 114 (H) 08/21/2024   ALKPHOS 206 (H) 08/21/2024   BILITOT 0.6 08/21/2024      Microbiology: Recent Results (from the past 240 hours)  Resp panel by RT-PCR (RSV, Flu A&B, Covid) Anterior Nasal Swab     Status: None   Collection Time: 08/16/24  8:19 AM   Specimen: Anterior Nasal Swab  Result Value Ref Range Status   SARS Coronavirus 2 by RT PCR NEGATIVE NEGATIVE Final   Influenza A by PCR NEGATIVE NEGATIVE Final   Influenza B by PCR NEGATIVE NEGATIVE Final    Comment: (NOTE) The Xpert Xpress SARS-CoV-2/FLU/RSV plus assay is intended as an aid in the diagnosis of influenza from Nasopharyngeal swab specimens and should not be used as a sole basis for treatment. Nasal washings and aspirates are unacceptable for Xpert Xpress SARS-CoV-2/FLU/RSV testing.  Fact Sheet for Patients: bloggercourse.com  Fact Sheet for Healthcare Providers: seriousbroker.it  This test is not yet approved or cleared by the [place]  FDA and has been authorized for detection and/or diagnosis of SARS-CoV-2 by FDA under an Emergency Use Authorization (EUA). This EUA will remain in effect (meaning this test can be  used) for the duration of the COVID-19 declaration under Section 564(b)(1) of the Act, 21 U.S.C. section 360bbb-3(b)(1), unless the authorization is terminated or revoked.     Resp Syncytial Virus by PCR NEGATIVE NEGATIVE Final    Comment: (NOTE) Fact Sheet for Patients: bloggercourse.com  Fact Sheet for Healthcare Providers: seriousbroker.it  This test is not yet approved or cleared by the [place]  FDA and has been authorized for detection and/or diagnosis of SARS-CoV-2 by FDA under an Emergency Use Authorization (EUA). This EUA will remain in effect (meaning this test can be used) for the duration of the COVID-19 declaration under Section 564(b)(1) of the Act, 21 U.S.C. section 360bbb-3(b)(1), unless the authorization is terminated or revoked.  Performed at [facility], [address] [place], [place] [address]   MRSA Next Gen by PCR, Nasal     Status: Abnormal   Collection Time: 08/17/24  4:51 PM   Specimen: Nasal  Mucosa; Nasal Swab  Result Value Ref Range Status   MRSA by PCR Next Gen DETECTED (A) NOT DETECTED Final    Comment: RESULT CALLED TO, READ BACK BY AND VERIFIED WITH: L BROWN RN 08/17/2024 @ 2142 BY AB (NOTE) The GeneXpert MRSA Assay (FDA approved for NASAL specimens only), is one component of a comprehensive MRSA colonization surveillance program. It is not intended to diagnose MRSA infection nor to guide or monitor treatment for MRSA infections. Test performance is not FDA approved in patients less than 67 years old. Performed at [facility], [address] [place], [place] [address]   Culture, blood (Routine X 2) w Reflex to ID Panel     Status: Abnormal   Collection Time: 08/17/24  5:58 PM   Specimen: BLOOD LEFT HAND  Result Value Ref Range Status   Specimen Description BLOOD LEFT HAND  Final   Special Requests   Final    BOTTLES DRAWN AEROBIC AND ANAEROBIC Blood Culture adequate  volume   Culture  Setup Time   Final    GRAM POSITIVE COCCI IN BOTH AEROBIC AND ANAEROBIC BOTTLES CRITICAL RESULT CALLED TO, READ BACK BY AND VERIFIED WITH: PHARMD J.FRENS AT 0802 ON 08/18/2024 BY T.SAAD. Performed at [facility], [address], [place], [place] [address]    Culture METHICILLIN RESISTANT STAPHYLOCOCCUS AUREUS (A)  Final   Report Status 08/21/2024 FINAL  Final   Organism ID, Bacteria METHICILLIN RESISTANT STAPHYLOCOCCUS AUREUS  Final      Susceptibility   Methicillin resistant staphylococcus aureus - MIC*    CIPROFLOXACIN  >=8 RESISTANT Resistant     ERYTHROMYCIN  >=8 RESISTANT Resistant     GENTAMICIN  <=0.5 SENSITIVE Sensitive     OXACILLIN >=4 RESISTANT Resistant     TETRACYCLINE <=1 SENSITIVE Sensitive     VANCOMYCIN  1 SENSITIVE Sensitive     TRIMETH/SULFA <=10 SENSITIVE Sensitive     CLINDAMYCIN  <=0.25 SENSITIVE Sensitive     RIFAMPIN <=0.5 SENSITIVE Sensitive     Inducible Clindamycin  NEGATIVE Sensitive     LINEZOLID 2 SENSITIVE Sensitive     * METHICILLIN RESISTANT STAPHYLOCOCCUS AUREUS  Blood Culture ID Panel (Reflexed)     Status: Abnormal   Collection Time: 08/17/24  5:58 PM  Result Value Ref Range Status   Enterococcus faecalis NOT DETECTED NOT DETECTED Final   Enterococcus Faecium NOT DETECTED NOT DETECTED Final   Listeria monocytogenes NOT DETECTED NOT DETECTED Final   Staphylococcus species DETECTED (A) NOT DETECTED Final    Comment: CRITICAL RESULT CALLED TO, READ BACK BY AND VERIFIED WITH: PHARMD J.FRENS AT 0802 ON 08/18/2024 BY T.SAAD.    Staphylococcus aureus (BCID) DETECTED (A) NOT DETECTED Final    Comment: Methicillin (oxacillin)-resistant Staphylococcus aureus (MRSA). MRSA is predictably resistant to beta-lactam antibiotics (except ceftaroline). Preferred therapy is vancomycin  unless clinically contraindicated. Patient requires contact precautions if  hospitalized. CRITICAL RESULT CALLED TO, READ BACK BY AND VERIFIED WITH: PHARMD  J.FRENS AT 0802 ON 08/18/2024 BY T.SAAD.    Staphylococcus epidermidis NOT DETECTED NOT DETECTED Final   Staphylococcus lugdunensis NOT DETECTED NOT DETECTED Final   Streptococcus species NOT DETECTED NOT DETECTED Final   Streptococcus agalactiae NOT DETECTED NOT DETECTED Final   Streptococcus pneumoniae NOT DETECTED NOT DETECTED Final   Streptococcus pyogenes NOT DETECTED NOT DETECTED Final   A.calcoaceticus-baumannii NOT DETECTED NOT DETECTED Final   Bacteroides fragilis NOT DETECTED NOT DETECTED Final   Enterobacterales NOT DETECTED NOT DETECTED Final   Enterobacter cloacae complex NOT DETECTED NOT DETECTED  Final   Escherichia coli NOT DETECTED NOT DETECTED Final   Klebsiella aerogenes NOT DETECTED NOT DETECTED Final   Klebsiella oxytoca NOT DETECTED NOT DETECTED Final   Klebsiella pneumoniae NOT DETECTED NOT DETECTED Final   Proteus species NOT DETECTED NOT DETECTED Final   Salmonella species NOT DETECTED NOT DETECTED Final   Serratia marcescens NOT DETECTED NOT DETECTED Final   Haemophilus influenzae NOT DETECTED NOT DETECTED Final   Neisseria meningitidis NOT DETECTED NOT DETECTED Final   Pseudomonas aeruginosa NOT DETECTED NOT DETECTED Final   Stenotrophomonas maltophilia NOT DETECTED NOT DETECTED Final   Candida albicans NOT DETECTED NOT DETECTED Final   Candida auris NOT DETECTED NOT DETECTED Final   Candida glabrata NOT DETECTED NOT DETECTED Final   Candida krusei NOT DETECTED NOT DETECTED Final   Candida parapsilosis NOT DETECTED NOT DETECTED Final   Candida tropicalis NOT DETECTED NOT DETECTED Final   Cryptococcus neoformans/gattii NOT DETECTED NOT DETECTED Final   Meth resistant mecA/C and MREJ DETECTED (A) NOT DETECTED Final    Comment: CRITICAL RESULT CALLED TO, READ BACK BY AND VERIFIED WITH: PHARMD J.FRENS AT 0802 ON 08/18/2024 BY T.SAAD. Performed at [facility], [address] [place], [place] [address]   Culture, blood (Routine X 2) w Reflex to ID Panel      Status: Abnormal   Collection Time: 08/17/24  6:01 PM   Specimen: BLOOD RIGHT HAND  Result Value Ref Range Status   Specimen Description BLOOD RIGHT HAND  Final   Special Requests   Final    BOTTLES DRAWN AEROBIC AND ANAEROBIC Blood Culture results may not be optimal due to an inadequate volume of blood received in culture bottles   Culture  Setup Time   Final    GRAM POSITIVE COCCI IN BOTH AEROBIC AND ANAEROBIC BOTTLES CRITICAL VALUE NOTED.  VALUE IS CONSISTENT WITH PREVIOUSLY REPORTED AND CALLED VALUE.    Culture (A)  Final    STAPHYLOCOCCUS AUREUS SUSCEPTIBILITIES PERFORMED ON PREVIOUS CULTURE WITHIN THE LAST 5 DAYS. Performed at [facility], [address], [place], [place] [address]    Report Status 08/21/2024 FINAL  Final  Culture, blood (Routine X 2) w Reflex to ID Panel     Status: Abnormal (Preliminary result)   Collection Time: 08/19/24 11:15 AM   Specimen: BLOOD  Result Value Ref Range Status   Specimen Description BLOOD BLOOD RIGHT ARM  Final   Special Requests   Final    BOTTLES DRAWN AEROBIC AND ANAEROBIC Blood Culture results may not be optimal due to an inadequate volume of blood received in culture bottles   Culture  Setup Time   Final    GRAM POSITIVE COCCI IN CLUSTERS IN BOTH AEROBIC AND ANAEROBIC BOTTLES Gram Stain Report Called to,Read Back By and Verified With: PHARMD G ABBOTT 08/20/2024 @ 0552 BY AB CRITICAL VALUE NOTED.  VALUE IS CONSISTENT WITH PREVIOUSLY REPORTED AND CALLED VALUE.    Culture (A)  Final    STAPHYLOCOCCUS AUREUS SUSCEPTIBILITIES PERFORMED ON PREVIOUS CULTURE WITHIN THE LAST 5 DAYS. Performed at [facility], [address], [place], [place] [address]    Report Status PENDING  Incomplete  Culture, blood (Routine X 2) w Reflex to ID Panel     Status: None (Preliminary result)   Collection Time: 08/19/24 11:15 AM   Specimen: BLOOD LEFT ARM  Result Value Ref Range Status   Specimen Description BLOOD LEFT ARM  Final    Special Requests   Final  BOTTLES DRAWN AEROBIC ONLY Blood Culture results may not be optimal due to an inadequate volume of blood received in culture bottles   Culture  Setup Time   Final    GRAM POSITIVE COCCI IN CLUSTERS AEROBIC BOTTLE ONLY CRITICAL VALUE NOTED.  VALUE IS CONSISTENT WITH PREVIOUSLY REPORTED AND CALLED VALUE. Performed at [facility], [address], [place], [place] [address]    Culture [facility] POSITIVE COCCI  Final   Report Status PENDING  Incomplete     Serology:   Imaging: If present, new imagings (plain films, ct scans, and mri) have been personally visualized and interpreted; radiology reports have been reviewed. Decision making incorporated into the Impression / Recommendations.  12/25 tte  1. Left ventricular ejection fraction, by estimation, is <20%. The left  ventricle has severely decreased function. The left ventricle demonstrates  global hypokinesis. The left ventricular internal cavity size was severely  dilated. Left ventricular  diastolic parameters are consistent with Grade III diastolic dysfunction  (restrictive).   2. Right ventricular systolic function is moderately reduced. The right  ventricular size is moderately enlarged. There is moderately elevated  pulmonary artery systolic pressure. The estimated right ventricular  systolic pressure is 48.4 mmHg.   3. Left atrial size was severely dilated.   4. Right atrial size was severely dilated.   5. A small pericardial effusion is present. The pericardial effusion is  anterior to the right ventricle. There is no evidence of cardiac  tamponade.   6. The mitral valve is normal in structure. Trivial mitral valve  regurgitation. No evidence of mitral stenosis.   7. The aortic valve is tricuspid. There is mild calcification of the  aortic valve. Aortic valve regurgitation is not visualized. Aortic valve  sclerosis/calcification is present, without any evidence of aortic  stenosis.   8. The  inferior vena cava is dilated in size with <50% respiratory  variability, suggesting right atrial pressure of 15 mmHg.   Constance ONEIDA Passer, MD [facility] [phone] pager    08/21/2024, 8:28 PM     [1]  Allergies Allergen Reactions   Injectafer [Ferric Carboxymaltose] Shortness Of Breath    Mild SOB following injectafer infusion    Penicillins Anaphylaxis and Rash    Tolerated ceftin  2020   Accupril [Quinapril Hcl] Other (See Comments)    Hyperkalemia=high potassium level   Celebrex [Celecoxib] Other (See Comments)    Hx hematemesis, bleeding ulcer   Cozaar  [Losartan  Potassium] Itching   Dml Forte Rash   Entresto [Sacubitril-Valsartan] Other (See Comments)    Hyperkalemia   Flonase  [Fluticasone ] Other (See Comments)    Hx nasal ulcer   Lactose Intolerance (Gi) Diarrhea   Lipitor [Atorvastatin ] Other (See Comments)    Myalgias    Nasonex  [Mometasone  Furoate] Other (See Comments)    Epistaxis    Nsaids Other (See Comments)    Hx hematemesis, bleeding ulcer   Advil [Ibuprofen] Nausea And Vomiting    Hx hematemesis, bleeding ulcer   Baking Soda-Fluoride [Sodium Fluoride] Itching and Rash   Bayer Aspirin  [Aspirin ] Other (See Comments)    Hx hematemesis, bleeding ulcer   Chamomile Hives   Clindamycin /Lincomycin Rash   Codeine Nausea And Vomiting   Lasix  [Furosemide ] Other (See Comments)    Tinnitus  Hypotension   Miralax  [Polyethylene Glycol (Macrogol)] Other (See Comments)    Leaky gut syndrome   Quinine Rash   Reclast  [Zoledronic  Acid] Rash and Other (See Comments)  Tremors    Rifadin [Rifampin] Other (See Comments)    Unknown reaction   Singulair  [Montelukast ] Other (See Comments)    Dizziness    Sulfa Antibiotics Rash   Ultram  [Tramadol ] Anxiety and Rash   Vesicare  [Solifenacin ] Other (See Comments)    Urinary retention   Wellbutrin  [Bupropion ] Other (See Comments)    Myalgias  Skin crawling sensation    Wool Alcohol  [Lanolin] Rash   "

## 2024-08-21 NOTE — Progress Notes (Addendum)
 " Progress Note   Patient: Tanya Hogan FMW:[id number] DOB: [birth year] DOA: 08/16/2024     5 DOS: the patient was seen and examined on 08/21/2024   Brief hospital course: [name] was admitted to the hospital with the working diagnosis of MRSA bacteremia.    73 yo female with the past medical history of heart failure, hypertension, chronic anemia, peripheral vascular disease, T2DM, hypothyroidism and obesity class 1 who presented with dyspnea. Recent hospitalization 12/10 to 08/08/24 for heart failure exacerbation. She was diuresed about 6 L and was discharged on oral torsemide  20 mg daily with instructions to increase the dose to 40 mg in case of volume overload.  Unfortunately at home she developed recurrent dyspnea and lower extremity edema prompting her to come back to the ED.  On his initial physical examination her blood pressure was 93/51, HR 89, RR 35 and 02 saturation 89%  Lungs with no wheezing or rhonchi, heart with S1 and S2 present and regular with no gallops or rubs, abdomen with no distention and trace lower extremity edema.   Patient was placed on IV bumetanide  for diuresis.   12/25 with lethargy, poorly response to IV loop diuretics. Place on Bipap for increased work of breathing. Transferred to ICU and placed central venous catheter.  MRSA bacteremia, left lower lobe infiltrate, likely pneumonia present on admission. Placed on antibiotic therapy  12/26 consulted EP with recommendations to extract pacemaker leads.  12/27 transferred to [facility].  12/28 patient poor candidate for pacer leads extraction, end stage heart failure and poor prognosis. Patient will need chronic suppressive therapy with antibiotics. Consult palliative care.  12/29 patient now DNR.   Assessment and Plan: * MRSA bacteremia 2 out of 2 bottles positive for MRSA.  Wbc 13.3  Possible left lower lobe pneumonia   Patient has been on antibiotic therapy with vancomycin   12/27 Follow up on repeat blood  cultures with positive gram positive cocci in clusters.   Patient is very high risk for lead explantation, she would like to continue with suppressive antibiotics.  She has very poor prognosis in the setting of end stage heart failure  Follow up with ID recommendations  Palliative care consulted, patient now is DNR .   Acute on chronic systolic CHF (congestive heart failure) (HCC) Echocardiogram with reduced LV systolic function EF <20%, global hypokinesis, severe dilatation of LV cavity, grade III diastolic dysfunction (restrictive), RV systolic function with moderate reduction, RVSP 48.8 mmHg,  LA and RA with severe dilatation, small pericardial effusion   Systolic blood pressure 100 mmHg range   Holding guideline directed medical therapy due to risk of hypotension  Continue with spironolactone  for RAAS inhibition   Essential hypertension Continue to hold on antihypertensive agents due to risk of hypotension Continue blood pressure monitoring   Ventricular tachycardia, unspecified (HCC) Refractory VT, end stage heart failure  On amiodarone  and quinidine.  SP CRT D  Type 2 diabetes mellitus with hyperlipidemia (HCC) Continue glucose cover and monitoring with insulin  sliding scale Continue with statin therapy   NASH (nonalcoholic steatohepatitis) Stable with no signs of decompensation   Gastroesophageal reflux disease Continue pantoprazole    COPD (chronic obstructive pulmonary disease) (HCC) No signs of exacerbation  Continue bronchodilator therapy Continue oxymetry monitoring   Hypothyroidism Continue with levothyroxine    Irritable bowel syndrome with constipation Continue home bowel regimen Currently stable  History of left breast cancer In remission follow-up as an outpatient  Microcytic anemia Positive iron deficiency anemia Cell count has been stable  Hyponatremia Today renal function stable with serum cr at 0,58 with K at 4,1 and serum bicarbonate at  24 Na 134 and Mg 2,0  Continue with spironolactone  for RAAS inhibition  Follow up renal function   Obesity, Class I, BMI 30-34.9 Calculated BMI is 33.7      Subjective: Patient with no chest pain and no dyspnea, no nausea or vomiting, no PND, orthopnea or lower extremity edema   Physical Exam: Vitals:   08/21/24 0402 08/21/24 0835 08/21/24 0837 08/21/24 1306  BP: (!) 98/59 (!) 107/56 (!) 107/56 (!) 110/54  Pulse: 91 89 89 88  Resp: 20 (!) 26 19 20   Temp: 97.6 F (36.4 C) 97.6 F (36.4 C) 97.6 F (36.4 C) 97.6 F (36.4 C)  TempSrc: Oral Oral  Axillary  SpO2:  95% 96% 94%  Weight:      Height:       Neurology awake and alert, deconditioned ENT with mild pallor with no icterus  Cardiovascular with S1 and S2 present and regular with no gallops, or rubs, positive murmur at the apex No JVD Respiratory with no rales or wheezing, no rhonchi Abdomen with no distention  No lower extremity edema   Data Reviewed:    Family Communication: no family at the bedside   Disposition: Status is: Inpatient Remains inpatient appropriate because: IV antibiotics   Planned Discharge Destination: Home     Author: Elidia Toribio Furnace, MD 08/21/2024 3:13 PM  For on call review [email or web address].  "

## 2024-08-21 NOTE — Plan of Care (Signed)

## 2024-08-21 NOTE — Progress Notes (Addendum)
 "  Progress Note  Patient Name: Tanya Hogan Date of Encounter: 08/21/2024 [place] Cardiologist: Tanya Shallow, MD   Interval Summary   Denies any chest pain.  Continues to have some shortness of breath and orthopnea.  Stated that she did have some abdominal discomfort overnight.  Vital Signs Vitals:   08/20/24 1634 08/20/24 1931 08/21/24 0402 08/21/24 0835  BP: 122/61 106/73 (!) 98/59 (!) 107/56  Pulse: 88 92 91 89  Resp: 19 19 20  (!) 26  Temp: 98 F (36.7 C) 97.6 F (36.4 C) 97.6 F (36.4 C) 97.6 F (36.4 C)  TempSrc: Oral Oral Oral Oral  SpO2: 99% 98%  95%  Weight:      Height:       No intake or output data in the 24 hours ending 08/21/24 0953    08/20/2024    6:41 AM 08/19/2024    4:06 AM 08/18/2024    9:40 PM  Last 3 Weights  Weight (lbs) 178 lb 2.1 oz 178 lb 5.6 oz 176 lb 9.4 oz  Weight (kg) 80.8 kg 80.9 kg 80.1 kg      Telemetry/ECG  A paced V paced rhythm with a rate of 90.- Personally Reviewed  Physical Exam  GEN: No acute distress.   Neck: No JVD Cardiac: RRR, no murmurs, rubs, or gallops.  Respiratory: Clear to auscultation bilaterally. GI: Soft, nontender, non-distended  MS: No edema  Assessment & Plan   Tanya Hogan is a 73 y.o. female with a NICM/CHB, HFrEF likely in part 2/2 pacing-induced CM s/p LEFT MDT CRTP (RA/RV/CS-AIV lead) with subsequent addition of RV-LBaP lead (11/03/21) with subsequent addition of RV-d lead (05/31/24) for ATP for refractory VT, DM2, NASH cirrhosis who presented with lethargy and respiratory distress and is now found to have 4/4 cultures with MRSA.   Bacteremia Had 4 blood cultures that were positive for MRSA bacteremia.  Will need 6 weeks of IV antibiotics.  The patient is being followed by infectious disease.     [employer] CRT defibrillator  Dual-chamber leads were originally placed in 2003.  A lead was placed to the left bundle area in 2023.  RV ICD lead was placed on 05/2024.  There are  abandoned RV and LV leads.  Dr. Almetta discussed different options with the patient and her family.  They were agreeable to hold off pursuing additional procedures at this time. Yesterday was seen by palliative care to discuss goals of care. Has outpatient follow-up with Dr. Almetta on 10/06/2023.   HFrEF Nonischemic cardiomyopathy Prior TTE in 04/2024 showed a reduced LVEF of 20-25%. Left heart cath on 04/2024 showed no obstructive CAD TTE on 08/17/2024 showed reduced LVEF of less than 20%, global hypokinesis, G3 DD, moderately reduced RV systolic function, RV systolic pressure of 48.4 mmHg, and small anterior pericardial effusion.  Most recent BP this morning at around 8:30 was 107/56. GDMT has been limited by soft blood pressures.   Has had hyperkalemia in past will likely not tolerate Entresto/ARB, or ACE. Patient reported previously having constipation with SGLT2. Continue Aldactone  12.5 mg daily. Has an outpatient follow-up scheduled with an APP on 09/01/2023 and Dr. Shallow on 09/26/2023   History of VT Continue amiodarone  200 mg daily Continue quinidine sulfate  200 mg 3 times daily   NASH cirrhosis Management per primary   Type 2 diabetes Management per primary   Hyperlipidemia Continue pravastatin  40 mg daily   OSA on BiPAP   Otherwise management per primary  For questions or updates, please contact Tanya Hogan Please consult [email or web address] for contact info under        Signed, Tanya Clause, PA-C   History and all data above reviewed.  I personally took the history today, performed substantive portion of the physical exam and formulated the assessment and plan.   She has no acute complaints.  She is weak.   I reviewed all relevant tests and studies. Patient examined.  I agree with the findings as above.  The patient exam reveals COR: Irregular   ,  Lungs: Decreased breath sounds at the bases  ,  Abd: Positive bowel sounds, no rebound no guarding, Ext No  edema  .  All available labs, radiology testing, previous records reviewed. Agree with documented assessment and plan.   Acute systolic HF:  Seems to be euvolemic today.  No change in diuresis.  Not able to titrate meds secondary to CKD hypotension.  VT:  Continue current meds as above.  MRSA:  Plan is for long term IV antibiotics without removing leads/hardware   Tanya Hogan  11:59 AM  08/21/2024  "

## 2024-08-21 NOTE — Progress Notes (Signed)
 Placed patient on bipap for the night

## 2024-08-22 ENCOUNTER — Encounter (HOSPITAL_COMMUNITY): Admission: EM | Disposition: A | Payer: Self-pay | Source: Home / Self Care | Attending: Internal Medicine

## 2024-08-22 DIAGNOSIS — I10 Essential (primary) hypertension: Secondary | ICD-10-CM | POA: Diagnosis not present

## 2024-08-22 DIAGNOSIS — R7881 Bacteremia: Secondary | ICD-10-CM | POA: Diagnosis not present

## 2024-08-22 DIAGNOSIS — I472 Ventricular tachycardia, unspecified: Secondary | ICD-10-CM | POA: Diagnosis not present

## 2024-08-22 DIAGNOSIS — B9562 Methicillin resistant Staphylococcus aureus infection as the cause of diseases classified elsewhere: Secondary | ICD-10-CM | POA: Diagnosis not present

## 2024-08-22 DIAGNOSIS — I5023 Acute on chronic systolic (congestive) heart failure: Secondary | ICD-10-CM | POA: Diagnosis not present

## 2024-08-22 DIAGNOSIS — I5021 Acute systolic (congestive) heart failure: Secondary | ICD-10-CM | POA: Diagnosis not present

## 2024-08-22 LAB — CULTURE, BLOOD (ROUTINE X 2)
Culture  Setup Time: NO GROWTH
Special Requests: ADEQUATE

## 2024-08-22 LAB — COMPREHENSIVE METABOLIC PANEL WITH GFR
ALT: 73 U/L — ABNORMAL HIGH (ref 0–44)
AST: 112 U/L — ABNORMAL HIGH (ref 15–41)
Albumin: 2.7 g/dL — ABNORMAL LOW (ref 3.5–5.0)
Alkaline Phosphatase: 234 U/L — ABNORMAL HIGH (ref 38–126)
Anion gap: 8 (ref 5–15)
BUN: 9 mg/dL (ref 8–23)
CO2: 25 mmol/L (ref 22–32)
Calcium: 9.5 mg/dL (ref 8.9–10.3)
Chloride: 100 mmol/L (ref 98–111)
Creatinine, Ser: 0.65 mg/dL (ref 0.44–1.00)
GFR, Estimated: >60 mL/min
Glucose, Bld: 112 mg/dL — ABNORMAL HIGH (ref 70–99)
Potassium: 4.3 mmol/L (ref 3.5–5.1)
Sodium: 133 mmol/L — ABNORMAL LOW (ref 135–145)
Total Bilirubin: 0.6 mg/dL (ref 0.0–1.2)
Total Protein: 5.9 g/dL — ABNORMAL LOW (ref 6.5–8.1)

## 2024-08-22 LAB — GLUCOSE, CAPILLARY
Glucose-Capillary: 125 mg/dL — ABNORMAL HIGH (ref 70–99)
Glucose-Capillary: 131 mg/dL — ABNORMAL HIGH (ref 70–99)
Glucose-Capillary: 84 mg/dL (ref 70–99)
Glucose-Capillary: 107 mg/dL — ABNORMAL HIGH (ref 70–99)
Glucose-Capillary: 147 mg/dL — ABNORMAL HIGH (ref 70–99)

## 2024-08-22 SURGERY — TRANSESOPHAGEAL ECHOCARDIOGRAM (TEE) (CATHLAB)
Anesthesia: Monitor Anesthesia Care

## 2024-08-22 MED ORDER — ARFORMOTEROL TARTRATE 15 MCG/2ML IN NEBU
15.0000 ug | INHALATION_SOLUTION | Freq: Two times a day (BID) | RESPIRATORY_TRACT | Status: DC
  Administered 2024-08-22 – 2024-08-30 (×14): 15 ug via RESPIRATORY_TRACT
  Filled 2024-08-22 (×15): qty 2

## 2024-08-22 MED ORDER — BUDESONIDE 0.25 MG/2ML IN SUSP
0.2500 mg | Freq: Two times a day (BID) | RESPIRATORY_TRACT | Status: DC
  Administered 2024-08-22 – 2024-08-30 (×16): 0.25 mg via RESPIRATORY_TRACT
  Filled 2024-08-22 (×18): qty 2

## 2024-08-22 MED ORDER — DAPTOMYCIN-SODIUM CHLORIDE 500-0.9 MG/50ML-% IV SOLN
8.0000 mg/kg | Freq: Every day | INTRAVENOUS | Status: DC
  Administered 2024-08-22 – 2024-08-28 (×7): 500 mg via INTRAVENOUS
  Filled 2024-08-22 (×10): qty 50

## 2024-08-22 MED ORDER — REVEFENACIN 175 MCG/3ML IN SOLN
175.0000 ug | Freq: Every day | RESPIRATORY_TRACT | Status: DC
  Administered 2024-08-22 – 2024-08-30 (×9): 175 ug via RESPIRATORY_TRACT
  Filled 2024-08-22 (×9): qty 3

## 2024-08-22 NOTE — Progress Notes (Signed)
 Mobility Specialist Progress Note;   08/22/24 0905  Mobility  Activity Ambulated with assistance;Pivoted/transferred to/from [facility]  Level of Assistance Standby assist, set-up cues, supervision of patient - no hands on  Assistive Device Front wheel [place]  Distance Ambulated (ft) 250 ft  Activity Response Tolerated well  Mobility Referral Yes  Mobility visit 1 Mobility  Mobility Specialist Start Time (ACUTE ONLY) [id number]  Mobility Specialist Stop Time (ACUTE ONLY) 0920  Mobility Specialist Time Calculation (min) (ACUTE ONLY) 15 min   Pt in chair upon arrival, eager for mobility. Required no physical assistance during ambulation, SV for safety. Took frequent standing rest breaks d/t fatigue. C/o both feet hurting during ambulation. Pt returned back to chair and left with all needs met. RN present.   Lauraine Erm Mobility Specialist Please contact via SecureChat or [employer] [phone]

## 2024-08-22 NOTE — Progress Notes (Signed)
 This chaplain is present for F/U on notarizing the Pt. Advance Directive. The chaplain understands the Pt. is participating in patient care and prefers a revisit on Wednesday.  This chaplain is available for F/U spiritual care as needed.  Chaplain Leeroy Hummer [phone]

## 2024-08-22 NOTE — Progress Notes (Signed)
 " Progress Note   Patient: Tanya Hogan FMW:[id number] DOB: 21-Oct-1950 DOA: 08/16/2024     6 DOS: the patient was seen and examined on 08/22/2024   Brief hospital course: Tanya Hogan was admitted to the hospital with the working diagnosis of MRSA bacteremia.    73 yo female with the past medical history of heart failure, hypertension, chronic anemia, peripheral vascular disease, T2DM, hypothyroidism and obesity class 1 who presented with dyspnea. Recent hospitalization 12/10 to 08/08/24 for heart failure exacerbation. She was diuresed about 6 L and was discharged on oral torsemide  20 mg daily with instructions to increase the dose to 40 mg in case of volume overload.  Unfortunately at home she developed recurrent dyspnea and lower extremity edema prompting her to come back to the ED.  On his initial physical examination her blood pressure was 93/51, HR 89, RR 35 and 02 saturation 89%  Lungs with no wheezing or rhonchi, heart with S1 and S2 present and regular with no gallops or rubs, abdomen with no distention and trace lower extremity edema.   Patient was placed on IV bumetanide  for diuresis.   12/25 with lethargy, poorly response to IV loop diuretics. Place on Bipap for increased work of breathing. Transferred to ICU and placed central venous catheter.  MRSA bacteremia, left lower lobe infiltrate, likely pneumonia present on admission. Placed on antibiotic therapy  12/26 consulted EP with recommendations to extract pacemaker leads.  12/27 transferred to [facility].  12/28 patient poor candidate for pacer leads extraction, end stage heart failure and poor prognosis. Patient will need chronic suppressive therapy with antibiotics. Consult palliative care.  12/29 patient now DNR.  12/30 antibiotic changed to daptomycin , patient has been persistent bacteremic with MRSA.   Assessment and Plan: * MRSA bacteremia Possible left lower lobe pneumonia  present on admission  12/25 blood cultures 4/4  bottles positive for MRSA 12/27 blood cultures 2/2 bottles positive for MRSA  12/29 blood cultures pending   Initially placed on IV vancomycin , today changed to daptomycin   Patient is very high risk for lead explantation. High risk for TEE   She has very poor prognosis in the setting of end stage heart failure  Continue to follow up with ID recommendations  Palliative care consulted, patient now is DNR .   Noted rash lower extremities not typical for [place] lesions, question drug reaction.   Acute on chronic systolic CHF (congestive heart failure) (HCC) Echocardiogram with reduced LV systolic function EF <20%, global hypokinesis, severe dilatation of LV cavity, grade III diastolic dysfunction (restrictive), RV systolic function with moderate reduction, RVSP 48.8 mmHg,  LA and RA with severe dilatation, small pericardial effusion   Systolic blood pressure 100 mmHg range   Holding guideline directed medical therapy due to risk of hypotension  Continue with spironolactone  for RAAS inhibition   Essential hypertension Continue to hold on antihypertensive agents due to risk of hypotension Continue blood pressure monitoring   Ventricular tachycardia, unspecified (HCC) Refractory VT, end stage heart failure  On amiodarone  and quinidine.  SP CRT D  Type 2 diabetes mellitus with hyperlipidemia (HCC) Continue glucose cover and monitoring with insulin  sliding scale Continue with statin therapy   NASH (nonalcoholic steatohepatitis) Stable with no signs of decompensation   Gastroesophageal reflux disease Continue pantoprazole    COPD (chronic obstructive pulmonary disease) (HCC) No signs of exacerbation  Continue bronchodilator therapy Continue oxymetry monitoring   Hypothyroidism Continue with levothyroxine    Irritable bowel syndrome with constipation Continue home bowel regimen Currently stable  History of left breast cancer In remission follow-up as an  outpatient  Microcytic anemia Positive iron deficiency anemia Cell count has been stable   Hyponatremia Renal function today with serum cr at 0,65 with K at 4,3 and serum bicarbonate at 25 Na 133   Continue with spironolactone  for RAAS inhibition  Follow up renal function   Obesity, Class I, BMI 30-34.9 Calculated BMI is 33.7      Subjective: Patient continue very weak and deconditioned, has developed painful rash on her feet, no dyspnea or chest pain.   Physical Exam: Vitals:   08/22/24 0937 08/22/24 0938 08/22/24 1100 08/22/24 1134  BP:  (!) 101/50  104/63  Pulse:  89  88  Resp:  (!) 31  (!) 31  Temp:  97.8 F (36.6 C) (!) (P) 97.4 F (36.3 C) (!) 97.4 F (36.3 C)  TempSrc:  Oral  Oral  SpO2: 99% 99%  97%  Weight:      Height:       Neurology awake and alert, deconditioned and ill looking appearing ENT with mild pallor with no icterus Cardiovascular with S1 and S2 present and regular with no gallops or rubs and no murmurs Respiratory with mild rales at bases with no wheezing or rhonchi Abdomen protuberant, soft and non tender No lower extremity edema,  Diffuse petechial rash no blanching at bilateral feet, more confluent at the dorsum. Erythema on the left upper extremity    Right foot   Left foot   Left forearm   Right leg   Data Reviewed:    Family Communication: no family at the bedside   Disposition: Status is: Inpatient Remains inpatient appropriate because: IV antibiotic therapy   Planned Discharge Destination: Home     Author: Elidia Toribio Furnace, MD 08/22/2024 3:27 PM  For on call review [email or web address].  "

## 2024-08-22 NOTE — Progress Notes (Signed)
 Patient has scattered area of petechia on her B/L feet and lower legs, she verbalized this is new, never noticed before. She feels tender on her right calf, denies any itching, burning. Notified to the attending MD.

## 2024-08-22 NOTE — Progress Notes (Signed)
 Occupational Therapy Treatment Patient Details Name: MARVALENE BARRETT MRN: [id number] DOB: [birth year] Today's Date: 08/22/2024   History of present illness Pt is 73 yo with recent hospitalization on 12/11 with swelling and difficulty breathing. Currently pt was admitted 12/24 for acute on chronic HF. 12/25 was minimally responsive requiring bipap. Blood cultures showed MRSA. EFY:mzrzwu revision of pacemaker/AICD, breast cancer, chronic heart failure, nonalcoholic cirrhosis, BiV pacemake, depression, DM2, GERD, HTN, hypothyroidism, valvular heart disease, pulmonary hypertension, sleep apnea and ventricular tachy arrhythmia   OT comments  Pt. Seen for skilled OT treatment session.  Pt. Reports concerns regarding presentation of B feet.  States RN aware and has massaged MD.  Session kept chair level today per above concerns.  Tx session focused on energy conservation strategies with use of 5Ps handouts.  Pt. Receptive to all education and able to provide examples of strategies already in place at home.  Demonstrated and reviewed benefits of PLB. Pt. Able to return demo and provide examples of when to use ie: during rest breaks.  Agree with current d/c recommendations.  Cont.with acute OT POC.        If plan is discharge home, recommend the following:  A little help with walking and/or transfers;A little help with bathing/dressing/bathroom;Assistance with cooking/housework;Assist for transportation   Equipment Recommendations  None recommended by OT    Recommendations for Other Services      Precautions / Restrictions Precautions Precautions: Fall Recall of Precautions/Restrictions: Impaired Precaution/Restrictions Comments: watch BP       Mobility Bed Mobility               General bed mobility comments: Pt sitting in chair upon arrival and at end of session    Transfers                   General transfer comment: deferred mobility today per pt. request and concerns of  her foot pain and red spotty patches on B feet     Balance                                           ADL either performed or assessed with clinical judgement   ADL Overall ADL's : Needs assistance/impaired                                       General ADL Comments: pt. with concerns for B foot pain and red spots on B feet. session kept to chair level today.  tx session focused on EC strategies through use of 5Ps.  reviewed handout and provided examples of each.  pt. receptive and also able to provide examples already in place prior to admission.  pt. describes breaking bathing into stages.  Pt. States I can put all my bathing items in a box or something to have them all together.  Reviewed she can take the basin in her room and use that also.  pt. also describes how she provides meals to her brother and carries the items in stages vs all at once to prevent dropping items and also fatigue while carrying.  provided demo and pt. able to return deom of PLB.    Extremity/Trunk Assessment              Vision  Perception     Praxis     Communication Communication Communication: No apparent difficulties   Cognition Arousal: Alert Behavior During Therapy: WFL for tasks assessed/performed Cognition: Cognition impaired         Attention impairment (select first level of impairment): Sustained attention Executive functioning impairment (select all impairments): Problem solving OT - Cognition Comments: Mild problem solving and attention deficits. Likely d/t fatigue.                 Following commands: Intact        Cueing   Cueing Techniques: Verbal cues  Exercises      Shoulder Instructions       General Comments      Pertinent Vitals/ Pain       Pain Assessment Pain Assessment: Faces Faces Pain Scale: Hurts even more Pain Location: B feet Pain Descriptors / Indicators: Aching Pain Intervention(s): Limited activity  within patient's tolerance  Home Living                                          Prior Functioning/Environment              Frequency  Min 2X/week        Progress Toward Goals  OT Goals(current goals can now be found in the care plan section)  Progress towards OT goals: Progressing toward goals     Plan      Co-evaluation                 AM-PAC OT 6 Clicks Daily Activity     Outcome Measure   Help from another person eating meals?: None Help from another person taking care of personal grooming?: A Little Help from another person toileting, which includes using toliet, bedpan, or urinal?: A Little Help from another person bathing (including washing, rinsing, drying)?: A Little Help from another person to put on and taking off regular upper body clothing?: A Little Help from another person to put on and taking off regular lower body clothing?: A Little 6 Click Score: 19    End of Session    OT Visit Diagnosis: Muscle weakness (generalized) (M62.81)   Activity Tolerance Patient tolerated treatment well   Patient Left in chair;with call bell/phone within reach   Nurse Communication          Time: 8892-8882 OT Time Calculation (min): 10 min  Charges: OT General Charges $OT Visit: 1 Visit OT Treatments $Self Care/Home Management : 8-22 mins  Randall, COTA/L Acute Rehabilitation [phone]   CHRISTELLA Nest Lorraine-COTA/L  08/22/2024, 11:30 AM

## 2024-08-22 NOTE — Progress Notes (Signed)
 "   Progress Note  Patient Name: Tanya Hogan Date of Encounter: 08/22/2024  Primary Cardiologist:   Redell Shallow, MD   Subjective   Denies shortness of breath or pain.  She has a rash (petechia) on her feet and pressure points on her calves  Inpatient Medications    Scheduled Meds:  amiodarone   200 mg Oral Daily   arformoterol   15 mcg Nebulization BID   aspirin   81 mg Oral Daily   budesonide  (PULMICORT ) nebulizer solution  0.25 mg Nebulization BID   Chlorhexidine  Gluconate Cloth  6 each Topical Daily   heparin   5,000 Units Subcutaneous Q8H   insulin  aspart  0-5 Units Subcutaneous QHS   insulin  aspart  0-9 Units Subcutaneous TID WC   lactobacillus  1 g Oral TID WC   levothyroxine   112 mcg Oral Daily   multivitamin with minerals  1 tablet Oral Daily   mouth rinse  15 mL Mouth Rinse 4 times per day   pantoprazole   40 mg Oral QAC breakfast   pravastatin   40 mg Oral QHS   quiNIDine sulfate   200 mg Oral TID   revefenacin   175 mcg Nebulization Daily   sodium chloride  flush  3 mL Intravenous Q12H   sodium chloride  flush  3 mL Intravenous Q12H   spironolactone   12.5 mg Oral Daily   Continuous Infusions:  vancomycin  1,000 mg (08/21/24 1725)   PRN Meds: acetaminophen  **OR** acetaminophen , levalbuterol , ondansetron  **OR** ondansetron  (ZOFRAN ) IV, mouth rinse, oxyCODONE , senna-docusate, traZODone    Vital Signs    Vitals:   08/22/24 0007 08/22/24 0411 08/22/24 0423 08/22/24 0837  BP: 104/60   (!) 95/58  Pulse: 90   88  Resp: 20  20 (!) 23  Temp: (!) 97.3 F (36.3 C)  98.2 F (36.8 C) 98 F (36.7 C)  TempSrc: Oral  Axillary Oral  SpO2: 98%   96%  Weight:  80 kg    Height:        Intake/Output Summary (Last 24 hours) at 08/22/2024 0930 Last data filed at 08/22/2024 0400 Gross per 24 hour  Intake 240 ml  Output 900 ml  Net -660 ml   Filed Weights   08/19/24 0406 08/20/24 0641 08/22/24 0411  Weight: 80.9 kg 80.8 kg 80 kg    Telemetry    AV paced with PVCs  - Personally Reviewed  ECG    NA - Personally Reviewed  Physical Exam   GEN: No acute distress.   Neck: No  JVD Cardiac: RRR, no murmurs, rubs, or gallops.  Respiratory: Clear  to auscultation bilaterally. GI: Soft, nontender, non-distended  MS: No  edema; No deformity. Neuro:  Nonfocal  Psych: Normal affect   Labs    Chemistry Recent Labs  Lab 08/20/24 0340 08/21/24 0400 08/22/24 0516  NA 134* 134* 133*  K 3.7 4.1 4.3  CL 99 101 100  CO2 28 24 25   GLUCOSE 112* 108* 112*  BUN 19 13 9   CREATININE 0.68 0.58 0.65  CALCIUM  9.1 9.5 9.5  PROT 6.0* 6.0* 5.9*  ALBUMIN 2.8* 2.8* 2.7*  AST 144* 114* 112*  ALT 88* 77* 73*  ALKPHOS 208* 206* 234*  BILITOT 0.7 0.6 0.6  GFRNONAA >60 >60 >60  ANIONGAP 7 9 8      Hematology Recent Labs  Lab 08/18/24 0557 08/19/24 1115 08/20/24 0340  WBC 14.8* 13.2* 10.8*  RBC 3.99 4.21 4.07  HGB 12.4 12.7 12.6  HCT 38.4 39.5 37.8  MCV 96.2 93.8 92.9  MCH  31.1 30.2 31.0  MCHC 32.3 32.2 33.3  RDW 14.6 14.5 14.5  PLT 147* 157 176    Cardiac EnzymesNo results for input(s): TROPONINI in the last 168 hours. No results for input(s): TROPIPOC in the last 168 hours.   BNP Recent Labs  Lab 08/16/24 0707 08/17/24 0449 08/18/24 0557  PROBNP 27,035.0* 22,372.0* 10,332.0*     DDimer No results for input(s): DDIMER in the last 168 hours.   Radiology    No results found.  Cardiac Studies    ECHO:  08/17/24  1. Left ventricular ejection fraction, by estimation, is <20%. The left  ventricle has severely decreased function. The left ventricle demonstrates  global hypokinesis. The left ventricular internal cavity size was severely  dilated. Left ventricular  diastolic parameters are consistent with Grade III diastolic dysfunction  (restrictive).   2. Right ventricular systolic function is moderately reduced. The right  ventricular size is moderately enlarged. There is moderately elevated  pulmonary artery systolic pressure.  The estimated right ventricular  systolic pressure is 48.4 mmHg.   3. Left atrial size was severely dilated.   4. Right atrial size was severely dilated.   5. A small pericardial effusion is present. The pericardial effusion is  anterior to the right ventricle. There is no evidence of cardiac  tamponade.   6. The mitral valve is normal in structure. Trivial mitral valve  regurgitation. No evidence of mitral stenosis.   7. The aortic valve is tricuspid. There is mild calcification of the  aortic valve. Aortic valve regurgitation is not visualized. Aortic valve  sclerosis/calcification is present, without any evidence of aortic  stenosis.   8. The inferior vena cava is dilated in size with <50% respiratory  variability, suggesting right atrial pressure of 15 mmHg.    Patient Profile     73 y.o. female with a NICM/CHB, HFrEF likely in part 2/2 pacing-induced CM s/p LEFT MDT CRTP (RA/RV/CS-AIV lead) with subsequent addition of RV-LBaP lead (11/03/21) with subsequent addition of RV-d lead (05/31/24) for ATP for refractory VT, DM2, NASH cirrhosis who presented with lethargy and respiratory distress and is now found to have 4/4 cultures with MRSA.   Assessment & Plan    Bacteremia:  MRSA.  Plan is suppressive antibiotic therapy.  No plan for lead extraction.  ID notes reviewed and we are holding off on TEE given her frailty .  We will await her response to suppressive antibiotics and if bactermia persists we will need to have further discussion about goals of therapy.  Given her frailty we would lean away from high risk lead extraction in the future.    Non ischemic cardiomyopathy:  Unable to titrate meds secondary to hypotension.  Continue low dose spironolactone .     VT:  No sustained arrhythmias.  Continue suppressive treatment with amio and quinidine.      For questions or updates, please contact [facility] Please consult [email or web address] for contact info under Cardiology/STEMI.    Signed, Lynwood Schilling, MD  08/22/2024, 9:30 AM    "

## 2024-08-22 NOTE — Progress Notes (Signed)
 Pharmacy Antibiotic Note  Tanya Hogan is a 73 y.o. female admitted on 08/16/2024 with MRSA bacteremia.  Pharmacy has been consulted for Daptomycin  dosing.  Plan: Stop vancomycin  today Start Daptomycin  500mg  IV q24h CK weekly starting tomorrow  Height: 5' 1 (154.9 cm) Weight: 80 kg (176 lb 6.4 oz) IBW/kg (Calculated) : 47.8  Temp (24hrs), Avg:97.8 F (36.6 C), Min:97.3 F (36.3 C), Max:98.2 F (36.8 C)  Recent Labs  Lab 08/17/24 0449 08/17/24 1506 08/17/24 1745 08/18/24 0514 08/18/24 0557 08/19/24 1115 08/20/24 0340 08/20/24 1908 08/21/24 0400 08/22/24 0516  WBC 14.3*  --  14.7*  --  14.8* 13.2* 10.8*  --   --   --   CREATININE 0.90 0.93 0.83  --  0.66 0.59 0.68  --  0.58 0.65  LATICACIDVEN  --  2.3* 1.2 0.7  --   --   --   --   --   --   VANCOPEAK  --   --   --   --   --   --   --  27*  --   --     Estimated Creatinine Clearance: 58.2 mL/min (by C-G formula based on SCr of 0.65 mg/dL).    Allergies[1]  Antimicrobials this admission: cefepime   12/25 >> 12/26 Vancomycin   12/25 >> 12/29 Daptomycin  12/30>>  Dose adjustments this admission: N/a  Microbiology results: 12/25 BCx: MRSA 12/27 BCx: MRSA 12/29 Bcx: No growth so far   12/25 MRSA PCR: detected  Thank you for allowing pharmacy to be a part of this patients care.  Tanya Hogan 08/22/2024 10:55 AM     [1]  Allergies Allergen Reactions   Injectafer [Ferric Carboxymaltose] Shortness Of Breath    Mild SOB following injectafer infusion    Penicillins Anaphylaxis and Rash    Tolerated ceftin  2020   Accupril [Quinapril Hcl] Other (See Comments)    Hyperkalemia=high potassium level   Celebrex [Celecoxib] Other (See Comments)    Hx hematemesis, bleeding ulcer   Cozaar  [Losartan  Potassium] Itching   Dml Forte Rash   Entresto [Sacubitril-Valsartan] Other (See Comments)    Hyperkalemia   Flonase  [Fluticasone ] Other (See Comments)    Hx nasal ulcer   Lactose Intolerance (Gi) Diarrhea    Lipitor [Atorvastatin ] Other (See Comments)    Myalgias    Nasonex  [Mometasone  Furoate] Other (See Comments)    Epistaxis    Nsaids Other (See Comments)    Hx hematemesis, bleeding ulcer   Advil [Ibuprofen] Nausea And Vomiting    Hx hematemesis, bleeding ulcer   Baking Soda-Fluoride [Sodium Fluoride] Itching and Rash   Bayer Aspirin  [Aspirin ] Other (See Comments)    Hx hematemesis, bleeding ulcer   Chamomile Hives   Clindamycin /Lincomycin Rash   Codeine Nausea And Vomiting   Lasix  [Furosemide ] Other (See Comments)    Tinnitus  Hypotension   Miralax  [Polyethylene Glycol (Macrogol)] Other (See Comments)    Leaky gut syndrome   Quinine Rash   Reclast  [Zoledronic  Acid] Rash and Other (See Comments)    Tremors    Rifadin [Rifampin] Other (See Comments)    Unknown reaction   Singulair  [Montelukast ] Other (See Comments)    Dizziness    Sulfa Antibiotics Rash   Ultram  [Tramadol ] Anxiety and Rash   Vesicare  [Solifenacin ] Other (See Comments)    Urinary retention   Wellbutrin  [Bupropion ] Other (See Comments)    Myalgias  Skin crawling sensation   Wool Alcohol  [Lanolin] Rash

## 2024-08-22 NOTE — Plan of Care (Signed)
" °  Problem: Education: Goal: Knowledge of General Education information will improve Description: Including pain rating scale, medication(s)/side effects and non-pharmacologic comfort measures Outcome: Progressing   Problem: Health Behavior/Discharge Planning: Goal: Ability to manage health-related needs will improve Outcome: Progressing   Problem: Clinical Measurements: Goal: Ability to maintain clinical measurements within normal limits will improve Outcome: Progressing Goal: Will remain free from infection Outcome: Progressing Goal: Respiratory complications will improve Outcome: Progressing Goal: Cardiovascular complication will be avoided Outcome: Progressing   Problem: Activity: Goal: Risk for activity intolerance will decrease Outcome: Progressing   Problem: Coping: Goal: Level of anxiety will decrease Outcome: Progressing   Problem: Pain Managment: Goal: General experience of comfort will improve and/or be controlled Outcome: Progressing   "

## 2024-08-22 NOTE — Plan of Care (Signed)
" °  Problem: Nutrition: Goal: Adequate nutrition will be maintained Outcome: Progressing   Problem: Skin Integrity: Goal: Risk for impaired skin integrity will decrease Outcome: Progressing   Problem: Metabolic: Goal: Ability to maintain appropriate glucose levels will improve Outcome: Progressing   Problem: Nutritional: Goal: Maintenance of adequate nutrition will improve Outcome: Progressing Goal: Progress toward achieving an optimal weight will improve Outcome: Progressing   Problem: Skin Integrity: Goal: Risk for impaired skin integrity will decrease Outcome: Progressing   "

## 2024-08-23 ENCOUNTER — Other Ambulatory Visit: Payer: Self-pay

## 2024-08-23 DIAGNOSIS — K580 Irritable bowel syndrome with diarrhea: Secondary | ICD-10-CM | POA: Diagnosis not present

## 2024-08-23 DIAGNOSIS — Z515 Encounter for palliative care: Secondary | ICD-10-CM | POA: Diagnosis not present

## 2024-08-23 DIAGNOSIS — I5023 Acute on chronic systolic (congestive) heart failure: Secondary | ICD-10-CM | POA: Diagnosis not present

## 2024-08-23 DIAGNOSIS — L959 Vasculitis limited to the skin, unspecified: Secondary | ICD-10-CM

## 2024-08-23 DIAGNOSIS — B9562 Methicillin resistant Staphylococcus aureus infection as the cause of diseases classified elsewhere: Secondary | ICD-10-CM | POA: Diagnosis not present

## 2024-08-23 DIAGNOSIS — M79605 Pain in left leg: Secondary | ICD-10-CM

## 2024-08-23 DIAGNOSIS — Z452 Encounter for adjustment and management of vascular access device: Secondary | ICD-10-CM | POA: Diagnosis not present

## 2024-08-23 DIAGNOSIS — E1169 Type 2 diabetes mellitus with other specified complication: Secondary | ICD-10-CM | POA: Diagnosis not present

## 2024-08-23 DIAGNOSIS — Z853 Personal history of malignant neoplasm of breast: Secondary | ICD-10-CM | POA: Diagnosis not present

## 2024-08-23 DIAGNOSIS — M79604 Pain in right leg: Secondary | ICD-10-CM

## 2024-08-23 DIAGNOSIS — I5043 Acute on chronic combined systolic (congestive) and diastolic (congestive) heart failure: Secondary | ICD-10-CM

## 2024-08-23 DIAGNOSIS — K219 Gastro-esophageal reflux disease without esophagitis: Secondary | ICD-10-CM | POA: Diagnosis not present

## 2024-08-23 DIAGNOSIS — I509 Heart failure, unspecified: Secondary | ICD-10-CM | POA: Diagnosis not present

## 2024-08-23 DIAGNOSIS — J4489 Other specified chronic obstructive pulmonary disease: Secondary | ICD-10-CM | POA: Diagnosis not present

## 2024-08-23 DIAGNOSIS — I428 Other cardiomyopathies: Secondary | ICD-10-CM | POA: Diagnosis not present

## 2024-08-23 DIAGNOSIS — Z7189 Other specified counseling: Secondary | ICD-10-CM | POA: Diagnosis not present

## 2024-08-23 DIAGNOSIS — I110 Hypertensive heart disease with heart failure: Secondary | ICD-10-CM

## 2024-08-23 DIAGNOSIS — E66811 Obesity, class 1: Secondary | ICD-10-CM | POA: Diagnosis not present

## 2024-08-23 DIAGNOSIS — E876 Hypokalemia: Secondary | ICD-10-CM | POA: Diagnosis not present

## 2024-08-23 DIAGNOSIS — I5021 Acute systolic (congestive) heart failure: Secondary | ICD-10-CM | POA: Diagnosis not present

## 2024-08-23 DIAGNOSIS — E7849 Other hyperlipidemia: Secondary | ICD-10-CM | POA: Diagnosis not present

## 2024-08-23 DIAGNOSIS — G4733 Obstructive sleep apnea (adult) (pediatric): Secondary | ICD-10-CM | POA: Diagnosis not present

## 2024-08-23 DIAGNOSIS — I472 Ventricular tachycardia, unspecified: Secondary | ICD-10-CM | POA: Diagnosis not present

## 2024-08-23 DIAGNOSIS — I4729 Other ventricular tachycardia: Secondary | ICD-10-CM | POA: Diagnosis not present

## 2024-08-23 DIAGNOSIS — R7881 Bacteremia: Secondary | ICD-10-CM | POA: Diagnosis not present

## 2024-08-23 LAB — CBC
HCT: 41.4 % (ref 36.0–46.0)
Hemoglobin: 13.7 g/dL (ref 12.0–15.0)
MCH: 31.4 pg (ref 26.0–34.0)
MCHC: 33.1 g/dL (ref 30.0–36.0)
MCV: 94.7 fL (ref 80.0–100.0)
Platelets: 224 K/uL (ref 150–400)
RBC: 4.37 MIL/uL (ref 3.87–5.11)
RDW: 14.7 % (ref 11.5–15.5)
WBC: 10.2 K/uL (ref 4.0–10.5)
nRBC: 0 % (ref 0.0–0.2)

## 2024-08-23 LAB — CBC WITH DIFFERENTIAL/PLATELET
Abs Immature Granulocytes: 0.24 K/uL — ABNORMAL HIGH (ref 0.00–0.07)
Basophils Absolute: 0.1 K/uL (ref 0.0–0.1)
Basophils Relative: 1 %
Eosinophils Absolute: 0.1 K/uL (ref 0.0–0.5)
Eosinophils Relative: 1 %
HCT: 41.3 % (ref 36.0–46.0)
Hemoglobin: 13.7 g/dL (ref 12.0–15.0)
Immature Granulocytes: 2 %
Lymphocytes Relative: 12 %
Lymphs Abs: 1.3 K/uL (ref 0.7–4.0)
MCH: 30.8 pg (ref 26.0–34.0)
MCHC: 33.2 g/dL (ref 30.0–36.0)
MCV: 92.8 fL (ref 80.0–100.0)
Monocytes Absolute: 0.5 K/uL (ref 0.1–1.0)
Monocytes Relative: 5 %
Neutro Abs: 8.4 K/uL — ABNORMAL HIGH (ref 1.7–7.7)
Neutrophils Relative %: 79 %
Platelets: 252 K/uL (ref 150–400)
RBC: 4.45 MIL/uL (ref 3.87–5.11)
RDW: 14.7 % (ref 11.5–15.5)
WBC: 10.6 K/uL — ABNORMAL HIGH (ref 4.0–10.5)
nRBC: 0 % (ref 0.0–0.2)

## 2024-08-23 LAB — BASIC METABOLIC PANEL WITH GFR
Anion gap: 8 (ref 5–15)
BUN: 9 mg/dL (ref 8–23)
CO2: 27 mmol/L (ref 22–32)
Calcium: 9.8 mg/dL (ref 8.9–10.3)
Chloride: 97 mmol/L — ABNORMAL LOW (ref 98–111)
Creatinine, Ser: 0.7 mg/dL (ref 0.44–1.00)
GFR, Estimated: >60 mL/min
Glucose, Bld: 120 mg/dL — ABNORMAL HIGH (ref 70–99)
Potassium: 4.6 mmol/L (ref 3.5–5.1)
Sodium: 132 mmol/L — ABNORMAL LOW (ref 135–145)

## 2024-08-23 LAB — CK: Total CK: 123 U/L (ref 38–234)

## 2024-08-23 LAB — GLUCOSE, CAPILLARY
Glucose-Capillary: 110 mg/dL — ABNORMAL HIGH (ref 70–99)
Glucose-Capillary: 124 mg/dL — ABNORMAL HIGH (ref 70–99)
Glucose-Capillary: 122 mg/dL — ABNORMAL HIGH (ref 70–99)
Glucose-Capillary: 133 mg/dL — ABNORMAL HIGH (ref 70–99)
Glucose-Capillary: 173 mg/dL — ABNORMAL HIGH (ref 70–99)

## 2024-08-23 MED ORDER — HYDROCODONE-ACETAMINOPHEN 5-325 MG PO TABS
1.0000 | ORAL_TABLET | Freq: Four times a day (QID) | ORAL | Status: DC | PRN
  Administered 2024-08-23 – 2024-08-28 (×5): 1 via ORAL
  Filled 2024-08-23 (×6): qty 1

## 2024-08-23 NOTE — TOC Initial Note (Signed)
 Transition of Care (TOC) - Initial/Assessment Note  Rayfield Gobble RN, BSN Inpatient Care Management Unit 4E- RN Case Manager See Treatment Team for direct phone #   Patient Details  Name: Tanya Hogan MRN: [id number] Date of Birth: 1950/10/24  Transition of Care [facility]) CM/SW Contact:    Gobble Rayfield Hurst, RN Phone Number: 08/23/2024, 4:53 PM  Clinical Narrative:                 Recent discharge home w/ HH, Active w/ [facility]. Will need new HH orders for RN/PT/OT. Note plan for home IV abx with end date Jan. 26. ID sent referral to [facility] from [facility] for home IV abx needs.  Pam has reached out to daughter and plans to meet with her this evening for education.   CM has spoken with Burnard at [facility]- confirmed they can support for home IV abx needs- however due to the holiday this week- can not see pt for a re-start of care until Monday Jan. 5.   Pt plans to go home with her daughter- address is: [address] [place] [address].   IPCM to continue to follow for coordination of transition needs.   Expected Discharge Plan: Home w Home Health Services Barriers to Discharge: Continued Medical Work up   Patient Goals and CMS Choice Patient states their goals for this hospitalization and ongoing recovery are:: return home w/ daughter   Choice offered to / list presented to : Patient      Expected Discharge Plan and Services   Discharge Planning Services: CM Consult Post Acute Care Choice: Home Health, Resumption of Svcs/PTA Provider Living arrangements for the past 2 months: Single Family Home                           HH Arranged: RN, IV Antibiotics HH Agency: [facility] Date [facility] Agency Contacted: 08/23/24   Representative spoke with at [facility] Agency: Pam/Kelly  Prior Living Arrangements/Services Living arrangements for the past 2 months: Single Family Home Lives with:: Siblings (brother- plan to go stay with daughter)   Do you feel safe  going back to the place where you live?: Yes          Current home services: DME ([employer], cane, shower chair, bsc, w/chair)    Activities of Daily Living   ADL Screening (condition at time of admission) Independently performs ADLs?: Yes (appropriate for developmental age) Is the patient deaf or have difficulty hearing?: No Does the patient have difficulty seeing, even when wearing glasses/contacts?: No Does the patient have difficulty concentrating, remembering, or making decisions?: No  Permission Sought/Granted Permission sought to share information with : Case Manager, Magazine Features Editor Permission granted to share information with : Yes, Verbal Permission Granted     Permission granted to share info w AGENCY: [facility]        Emotional Assessment              Admission diagnosis:  Acute on chronic congestive heart failure with left ventricular diastolic dysfunction (HCC) [I50.33] Patient Active Problem List   Diagnosis Date Noted   Hyponatremia 08/20/2024   MRSA bacteremia 08/19/2024   Sepsis (HCC) 08/17/2024   Acute on chronic systolic CHF (congestive heart failure) (HCC) 08/16/2024   Hyperlipidemia 08/16/2024   Leukocytosis 08/16/2024   Phlebitis 08/08/2024   History of ventricular tachycardia 08/03/2024   Obesity, Class I, BMI 30-34.9 08/03/2024   Moderate episode of recurrent  major depressive disorder (HCC) 07/24/2024   Hordeolum externum of right eye 07/15/2024   Ventricular tachycardia (HCC) 05/28/2024   SVT (supraventricular tachycardia) 05/13/2024   Sensation as if bladder still full 11/23/2023   Fracture of base of fifth metatarsal bone of left foot 11/02/2023   Presence of heart assist device (HCC) 10/07/2023   Primary osteoarthritis of left shoulder 09/08/2023   Nonischemic cardiomyopathy (HCC) 02/16/2023   Stress and adjustment reaction 04/08/2022   Gastroesophageal reflux disease 02/16/2022   Ventricular tachycardia, unspecified (HCC)  10/24/2021   COPD (chronic obstructive pulmonary disease) (HCC) 09/21/2021   Osteoporosis 02/28/2021   Primary osteoarthritis of both knees 01/14/2021   Irritable bowel syndrome with constipation 09/16/2020   Aortic atherosclerosis 06/11/2020   Benign lipomatous neoplasm of skin and subcutaneous tissue of left leg 06/11/2020   NSVT (nonsustained ventricular tachycardia) (HCC) 02/14/2020   Genetic testing 08/14/2019   Ductal carcinoma in situ (DCIS) of right breast 06/15/2019   Arthralgia 04/11/2019   DDD (degenerative disc disease), cervical 11/19/2017   Peripheral edema 05/19/2017   Microcytic anemia 05/14/2017   Delayed sleep phase syndrome 10/29/2016   Primary osteoarthritis of left ankle 09/24/2016   History of arthroplasty of right shoulder 02/24/2016   Bilateral foot pain 01/22/2016   NASH (nonalcoholic steatohepatitis) 05/26/2015    Class: Stage 3   Nasal septal perforation 07/11/2014   Controlled diabetes mellitus type 2 with complications (HCC) 05/25/2014   Physical deconditioning 04/24/2014   OSA (obstructive sleep apnea) 06/08/2013   Chronic systolic heart failure (HCC) 05/23/2013   Asthma, moderate persistent 04/18/2013   Chronic cough 04/18/2013   History of left breast cancer 03/18/2010   Type 2 diabetes mellitus with hyperlipidemia (HCC) 11/07/2009   Fibromyalgia 10/29/2009   Biventricular cardiac pacemaker in situ 10/23/2009   Hypothyroidism 10/03/2009   Essential hypertension 07/02/2009   Chronic back pain 07/02/2009   PCP:  [name], MD Pharmacy:   [facility] [phone] - [place], [place] - [address] AT [facility] [address] [place] [address] Phone: [phone] Fax: [phone]  [facility]) - [place], [place] - [address] [facility] [address] [place] [address] [place] [address] Phone: [phone] Fax: [phone]  [facility] - [place], [place] - [address] [place] [address] Phone: [phone] Fax: [phone]     Social Drivers of Health (SDOH) Social History: SDOH Screenings   Food Insecurity: No Food Insecurity (08/16/2024)  Housing: Low Risk (08/16/2024)  Transportation Needs: No Transportation Needs (08/16/2024)  Recent Concern: Transportation Needs - Unmet Transportation Needs (08/09/2024)  Utilities: Not At Risk (08/16/2024)  Recent Concern: Utilities - At Risk (07/07/2024)   Received from [facility]  Alcohol  Screen: Low Risk (08/10/2023)  Depression (PHQ2-9): Low Risk (08/14/2024)  Financial Resource Strain: Low Risk (07/07/2024)   Received from [facility]  Physical Activity: Sufficiently Active (08/10/2023)  Recent Concern: Physical Activity - Insufficiently Active (07/12/2023)  Social Connections: Moderately Integrated (08/16/2024)  Stress: No Stress Concern Present (06/22/2024)   Received from [facility]  Tobacco Use: Medium Risk (08/16/2024)  Health Literacy: Adequate Health Literacy (08/10/2023)   SDOH Interventions:     Readmission Risk Interventions    08/04/2024    1:33 PM 07/14/2024   11:11 AM  Readmission Risk Prevention Plan  Transportation Screening Complete Complete  PCP or Specialist Appt within 3-5 Days Complete Complete  HRI or Home Care Consult Complete Complete  Palliative Care Screening Not Applicable Not Applicable  Medication Review (RN Care Manager) Complete Complete

## 2024-08-23 NOTE — Progress Notes (Signed)
 " PROGRESS NOTE  Tanya Hogan FMW:[id number] DOB: 1951/01/02 DOA: 08/16/2024 PCP: [name], MD   LOS: 7 days   Brief Narrative / Interim history: 73 year old female with history of chronic systolic CHF, HTN, PVD, DM2, presence of pacemaker, hypothyroidism comes into the hospital with shortness of breath.  Of note, she was recently hospitalized mid-December for CHF exacerbation, sent home after diuresis, but developed recurrent dyspnea and lower extremity edema at home and came back to the ED.  She was found to have MRSA bacteremia.  ID consulted and following.  Due to presence of pacemaker cardiology consulted as well.  She is deemed to be a very poor candidate for pacer lead extraction given end-stage heart failure and poor prognosis.  Palliative consulted as well  Subjective / 24h Interval events: She is complaining of a rash that appeared over the last couple of days, involving her legs, lower back, forearms.  She states that it is painful, denies pruritus  Assesement and Plan: Principal problem MRSA bacteremia -ID following, appreciate input.  Currently she is on daptomycin .  She has a pacemaker in place but not a candidate to remove so likely needs suppressive prolonged antibiotics - A TEE was not pursued due to high risk.  Blood cultures were positive for MRSA on 12/25 and 12/27, fortunately she now appears to have cleared the bacteremia as blood cultures on 12/29 appear to be without growth to date  Active problems Skin rash -in the setting of MRSA bacteremia, possibly IgA vasculitis.  Continue to closely monitor  Acute on chronic systolic CHF -most recent 2D echo showed LVEF less than 20%, global hypokinesis, severe LV dilatation, grade 3 diastolic dysfunction, RV systolic function moderate reduction, RVSP 48, biatrial severe dilatation and small pericardial effusion.  She was diuresed - Volume status improved now  CHB-with pacemaker.  Recently had subsequent addition of  RV-the lead on October 2025 for ATP for refractory VT  History of VT-no arrhythmias.  Continue suppressive treatment with amiodarone  as well as quinidine  Hypothyroidism -continue Synthroid   Essential hypertension-continue spironolactone   NASH cirrhosis-no signs of decompensation  Underlying COPD-no wheezing, stable, continue bronchodilator therapy  History of left breast cancer-outpatient follow-up, in remission  Microcytic anemia-continue to monitor, no bleeding  Hyponatremia-overall stable, no large shifts noted  Obesity, class I-BMI 33.3.    Scheduled Meds:  amiodarone   200 mg Oral Daily   arformoterol   15 mcg Nebulization BID   aspirin   81 mg Oral Daily   budesonide  (PULMICORT ) nebulizer solution  0.25 mg Nebulization BID   Chlorhexidine  Gluconate Cloth  6 each Topical Daily   heparin   5,000 Units Subcutaneous Q8H   insulin  aspart  0-5 Units Subcutaneous QHS   insulin  aspart  0-9 Units Subcutaneous TID WC   lactobacillus  1 g Oral TID WC   levothyroxine   112 mcg Oral Daily   multivitamin with minerals  1 tablet Oral Daily   mouth rinse  15 mL Mouth Rinse 4 times per day   pantoprazole   40 mg Oral QAC breakfast   pravastatin   40 mg Oral QHS   quiNIDine sulfate   200 mg Oral TID   revefenacin   175 mcg Nebulization Daily   sodium chloride  flush  3 mL Intravenous Q12H   sodium chloride  flush  3 mL Intravenous Q12H   spironolactone   12.5 mg Oral Daily   Continuous Infusions:  DAPTOmycin  500 mg (08/22/24 1141)   PRN Meds:.acetaminophen  **OR** acetaminophen , levalbuterol , ondansetron  **OR** ondansetron  (ZOFRAN ) IV, mouth rinse, oxyCODONE ,  senna-docusate, traZODone   Current Outpatient Medications  Medication Instructions   albuterol  (PROVENTIL ) 2.5 mg, Nebulization, Daily PRN   albuterol  (VENTOLIN  HFA) 108 (90 Base) MCG/ACT inhaler 2 puffs, Inhalation, Every 6 hours PRN   alendronate  (FOSAMAX ) 70 MG tablet TAKE 1 TABLET BY MOUTH EVERY 7  DAYS WITH FULL GLASS OF WATER  ON  AN EMPTY STOMACH   amiodarone  (PACERONE ) 200 mg, Oral, Daily   aspirin  81 mg, Oral, Daily   Calcium  Carbonate-Vit D-Min (CALCIUM  600+D PLUS MINERALS) 600-400 MG-UNIT TABS 1 tab p.o. twice daily   EPINEPHrine  (EPI-PEN) 0.3 mg, Intramuscular, As needed   fexofenadine  (ALLEGRA ) 180 mg, Oral, Daily   fluticasone -salmeterol (WIXELA INHUB) 250-50 MCG/ACT AEPB 1 puff, Inhalation, 2 times daily   levothyroxine  (SYNTHROID ) 112 mcg, Oral, Daily   metFORMIN  (GLUCOPHAGE -XR) 500 mg, Oral, Daily, Per 08/09/24 discharge states 1 tablet twice daily - patient states PCP has her on this once daily and they plan to discuss once or twice daily at 08/14/24 appt   Multiple Vitamins-Minerals (MULTIVITAMIN WOMEN 50+) TABS 1 tablet, Daily   pantoprazole  (PROTONIX ) 40 mg, Oral, Daily before breakfast   pravastatin  (PRAVACHOL ) 40 mg, Oral, Daily at bedtime   quiNIDine sulfate  200 mg, Oral, 3 times daily   SPIRIVA  RESPIMAT 2.5 MCG/ACT AERS 2 puffs, Inhalation, Daily   spironolactone  (ALDACTONE ) 12.5 mg, Oral, Daily   torsemide  (DEMADEX ) 20 mg, Oral, Daily, Take 2 tablets daily in case of weight gain 2 to 3 lbs in 24 hrs or 5 lbs in 7 days   valACYclovir  (VALTREX ) 1,000 mg, Oral, 2 times daily    Diet Orders (From admission, onward)     Start     Ordered   08/20/24 1220  Diet Heart Room service appropriate? Yes with Assist; Fluid consistency: Thin  Diet effective now       Question Answer Comment  Room service appropriate? Yes with Assist   Fluid consistency: Thin      08/20/24 1219            DVT prophylaxis: heparin  injection 5,000 Units Start: 08/16/24 1400 SCDs Start: 08/16/24 1020   Lab Results  Component Value Date   PLT 224 08/23/2024      Code Status: Limited: Do not attempt resuscitation (DNR) -DNR-LIMITED -Do Not Intubate/DNI   Family Communication: No family at bedside  Status is: Inpatient Remains inpatient appropriate because: Severity of illness   Level of care:  Telemetry  Consultants:  ID Cardiology  Objective: Vitals:   08/22/24 2317 08/23/24 0300 08/23/24 0555 08/23/24 0747  BP: (!) 98/58 102/65  (!) 117/93  Pulse: 88 89  90  Resp: 20 20 (!) 27 17  Temp: 98.3 F (36.8 C) 97.6 F (36.4 C)    TempSrc: Oral Oral  Oral  SpO2: 97% 95%  96%  Weight:      Height:        Intake/Output Summary (Last 24 hours) at 08/23/2024 1016 Last data filed at 08/23/2024 0802 Gross per 24 hour  Intake 246 ml  Output 500 ml  Net -254 ml   Wt Readings from Last 3 Encounters:  08/22/24 80 kg  08/14/24 81.2 kg  08/09/24 79.4 kg    Examination:  Constitutional: NAD Eyes: no scleral icterus ENMT: Mucous membranes are moist.  Neck: normal, supple Respiratory: clear to auscultation bilaterally, no wheezing, no crackles.  Cardiovascular: Regular rate and rhythm, no murmurs / rubs / gallops. No LE edema.  Abdomen: non distended, no tenderness. Bowel sounds positive.  Musculoskeletal: no  clubbing / cyanosis.  Skin: Rash as below    Data Reviewed: I have independently reviewed following labs and imaging studies   CBC Recent Labs  Lab 08/17/24 1745 08/18/24 0557 08/19/24 1115 08/20/24 0340 08/23/24 0452  WBC 14.7* 14.8* 13.2* 10.8* 10.2  HGB 12.7 12.4 12.7 12.6 13.7  HCT 38.6 38.4 39.5 37.8 41.4  PLT 165 147* 157 176 224  MCV 93.7 96.2 93.8 92.9 94.7  MCH 30.8 31.1 30.2 31.0 31.4  MCHC 32.9 32.3 32.2 33.3 33.1  RDW 14.5 14.6 14.5 14.5 14.7    Recent Labs  Lab 08/16/24 1749 08/17/24 0449 08/17/24 0809 08/17/24 1506 08/17/24 1745 08/18/24 0514 08/18/24 0557 08/19/24 1115 08/20/24 0340 08/21/24 0400 08/22/24 0516 08/23/24 0852  NA  --    < >  --  133* 132*  --  135 129* 134* 134* 133* 132*  K  --    < >  --  3.6 3.6  --  3.6 4.5 3.7 4.1 4.3 4.6  CL  --    < >  --  95* 94*  --  100 98 99 101 100 97*  CO2  --    < >  --  25 26  --  26 22 28 24 25 27   GLUCOSE  --    < >  --  124* 115*  --  123* 110* 112* 108* 112* 120*  BUN   --    < >  --  19 18  --  16 17 19 13 9 9   CREATININE  --    < >  --  0.93 0.83  --  0.66 0.59 0.68 0.58 0.65 0.70  CALCIUM   --    < >  --  9.1 8.8*  --  8.9 9.4 9.1 9.5 9.5 9.8  AST  --    < >  --  87*  --   --  83* 133* 144* 114* 112*  --   ALT  --    < >  --  57*  --   --  54* 82* 88* 77* 73*  --   ALKPHOS  --    < >  --  201*  --   --  186* 204* 208* 206* 234*  --   BILITOT  --    < >  --  1.1  --   --  1.0 1.1 0.7 0.6 0.6  --   ALBUMIN  --    < >  --  3.3*  --   --  3.0* 2.9* 2.8* 2.8* 2.7*  --   MG 1.8  --   --  1.9 2.8*  --   --  2.2 2.1 2.0  --   --   PROCALCITON 14.80  --   --   --   --   --   --   --   --   --   --   --   LATICACIDVEN  --   --   --  2.3* 1.2 0.7  --   --   --   --   --   --   INR  --   --   --   --  1.1  --   --   --   --   --   --   --   HGBA1C  --   --  5.9*  --   --   --   --   --   --   --   --   --  AMMONIA  --   --   --  22  --   --   --   --   --   --   --   --    < > = values in this interval not displayed.    ------------------------------------------------------------------------------------------------------------------ No results for input(s): CHOL, HDL, LDLCALC, TRIG, CHOLHDL, LDLDIRECT in the last 72 hours.  Lab Results  Component Value Date   HGBA1C 5.9 (H) 08/17/2024   ------------------------------------------------------------------------------------------------------------------ No results for input(s): TSH, T4TOTAL, T3FREE, THYROIDAB in the last 72 hours.  Invalid input(s): FREET3  Cardiac Enzymes No results for input(s): CKMB, TROPONINI, MYOGLOBIN in the last 168 hours.  Invalid input(s): CK ------------------------------------------------------------------------------------------------------------------    Component Value Date/Time   BNP 1,744.7 (H) 08/02/2024 1942   BNP 193 (H) 08/10/2022 1158    CBG: Recent Labs  Lab 08/22/24 1143 08/22/24 1717 08/22/24 2112 08/23/24 0636 08/23/24 0750   GLUCAP 84 107* 147* 110* 124*    Recent Results (from the past 240 hours)  Resp panel by RT-PCR (RSV, Flu A&B, Covid) Anterior Nasal Swab     Status: None   Collection Time: 08/16/24  8:19 AM   Specimen: Anterior Nasal Swab  Result Value Ref Range Status   SARS Coronavirus 2 by RT PCR NEGATIVE NEGATIVE Final   Influenza A by PCR NEGATIVE NEGATIVE Final   Influenza B by PCR NEGATIVE NEGATIVE Final    Comment: (NOTE) The Xpert Xpress SARS-CoV-2/FLU/RSV plus assay is intended as an aid in the diagnosis of influenza from Nasopharyngeal swab specimens and should not be used as a sole basis for treatment. Nasal washings and aspirates are unacceptable for Xpert Xpress SARS-CoV-2/FLU/RSV testing.  Fact Sheet for Patients: bloggercourse.com  Fact Sheet for Healthcare Providers: seriousbroker.it  This test is not yet approved or cleared by the [place]  FDA and has been authorized for detection and/or diagnosis of SARS-CoV-2 by FDA under an Emergency Use Authorization (EUA). This EUA will remain in effect (meaning this test can be used) for the duration of the COVID-19 declaration under Section 564(b)(1) of the Act, 21 U.S.C. section 360bbb-3(b)(1), unless the authorization is terminated or revoked.     Resp Syncytial Virus by PCR NEGATIVE NEGATIVE Final    Comment: (NOTE) Fact Sheet for Patients: bloggercourse.com  Fact Sheet for Healthcare Providers: seriousbroker.it  This test is not yet approved or cleared by the [place]  FDA and has been authorized for detection and/or diagnosis of SARS-CoV-2 by FDA under an Emergency Use Authorization (EUA). This EUA will remain in effect (meaning this test can be used) for the duration of the COVID-19 declaration under Section 564(b)(1) of the Act, 21 U.S.C. section 360bbb-3(b)(1), unless the authorization is terminated  or revoked.  Performed at [facility], [address] [place], [place] [address]   MRSA Next Gen by PCR, Nasal     Status: Abnormal   Collection Time: 08/17/24  4:51 PM   Specimen: Nasal Mucosa; Nasal Swab  Result Value Ref Range Status   MRSA by PCR Next Gen DETECTED (A) NOT DETECTED Final    Comment: RESULT CALLED TO, READ BACK BY AND VERIFIED WITH: L BROWN RN 08/17/2024 @ 2142 BY AB (NOTE) The GeneXpert MRSA Assay (FDA approved for NASAL specimens only), is one component of a comprehensive MRSA colonization surveillance program. It is not intended to diagnose MRSA infection nor to guide or monitor treatment for MRSA infections. Test performance is not FDA approved in patients less than 44 years old.  Performed at [facility], [address], [place], [place] [address]   Culture, blood (Routine X 2) w Reflex to ID Panel     Status: Abnormal   Collection Time: 08/17/24  5:58 PM   Specimen: BLOOD LEFT HAND  Result Value Ref Range Status   Specimen Description BLOOD LEFT HAND  Final   Special Requests   Final    BOTTLES DRAWN AEROBIC AND ANAEROBIC Blood Culture adequate volume   Culture  Setup Time   Final    GRAM POSITIVE COCCI IN BOTH AEROBIC AND ANAEROBIC BOTTLES CRITICAL RESULT CALLED TO, READ BACK BY AND VERIFIED WITH: PHARMD J.FRENS AT 0802 ON 08/18/2024 BY T.SAAD.    Culture (A)  Final    METHICILLIN RESISTANT STAPHYLOCOCCUS AUREUS Sent to [facility] for further susceptibility testing. Performed at [facility], [address] [place], [place] [address]    Report Status 08/21/2024 FINAL  Final   Organism ID, Bacteria METHICILLIN RESISTANT STAPHYLOCOCCUS AUREUS  Final      Susceptibility   Methicillin resistant staphylococcus aureus - MIC*    CIPROFLOXACIN  >=8 RESISTANT Resistant     ERYTHROMYCIN  >=8 RESISTANT Resistant     GENTAMICIN  <=0.5 SENSITIVE Sensitive     OXACILLIN >=4 RESISTANT Resistant     TETRACYCLINE <=1 SENSITIVE Sensitive      VANCOMYCIN  1 SENSITIVE Sensitive     TRIMETH/SULFA <=10 SENSITIVE Sensitive     CLINDAMYCIN  <=0.25 SENSITIVE Sensitive     RIFAMPIN <=0.5 SENSITIVE Sensitive     Inducible Clindamycin  NEGATIVE Sensitive     LINEZOLID 2 SENSITIVE Sensitive     * METHICILLIN RESISTANT STAPHYLOCOCCUS AUREUS  Blood Culture ID Panel (Reflexed)     Status: Abnormal   Collection Time: 08/17/24  5:58 PM  Result Value Ref Range Status   Enterococcus faecalis NOT DETECTED NOT DETECTED Final   Enterococcus Faecium NOT DETECTED NOT DETECTED Final   Listeria monocytogenes NOT DETECTED NOT DETECTED Final   Staphylococcus species DETECTED (A) NOT DETECTED Final    Comment: CRITICAL RESULT CALLED TO, READ BACK BY AND VERIFIED WITH: PHARMD J.FRENS AT 0802 ON 08/18/2024 BY T.SAAD.    Staphylococcus aureus (BCID) DETECTED (A) NOT DETECTED Final    Comment: Methicillin (oxacillin)-resistant Staphylococcus aureus (MRSA). MRSA is predictably resistant to beta-lactam antibiotics (except ceftaroline). Preferred therapy is vancomycin  unless clinically contraindicated. Patient requires contact precautions if  hospitalized. CRITICAL RESULT CALLED TO, READ BACK BY AND VERIFIED WITH: PHARMD J.FRENS AT 0802 ON 08/18/2024 BY T.SAAD.    Staphylococcus epidermidis NOT DETECTED NOT DETECTED Final   Staphylococcus lugdunensis NOT DETECTED NOT DETECTED Final   Streptococcus species NOT DETECTED NOT DETECTED Final   Streptococcus agalactiae NOT DETECTED NOT DETECTED Final   Streptococcus pneumoniae NOT DETECTED NOT DETECTED Final   Streptococcus pyogenes NOT DETECTED NOT DETECTED Final   A.calcoaceticus-baumannii NOT DETECTED NOT DETECTED Final   Bacteroides fragilis NOT DETECTED NOT DETECTED Final   Enterobacterales NOT DETECTED NOT DETECTED Final   Enterobacter cloacae complex NOT DETECTED NOT DETECTED Final   Escherichia coli NOT DETECTED NOT DETECTED Final   Klebsiella aerogenes NOT DETECTED NOT DETECTED Final   Klebsiella oxytoca  NOT DETECTED NOT DETECTED Final   Klebsiella pneumoniae NOT DETECTED NOT DETECTED Final   Proteus species NOT DETECTED NOT DETECTED Final   Salmonella species NOT DETECTED NOT DETECTED Final   Serratia marcescens NOT DETECTED NOT DETECTED Final   Haemophilus influenzae NOT DETECTED NOT DETECTED Final   Neisseria meningitidis NOT DETECTED NOT DETECTED Final  Pseudomonas aeruginosa NOT DETECTED NOT DETECTED Final   Stenotrophomonas maltophilia NOT DETECTED NOT DETECTED Final   Candida albicans NOT DETECTED NOT DETECTED Final   Candida auris NOT DETECTED NOT DETECTED Final   Candida glabrata NOT DETECTED NOT DETECTED Final   Candida krusei NOT DETECTED NOT DETECTED Final   Candida parapsilosis NOT DETECTED NOT DETECTED Final   Candida tropicalis NOT DETECTED NOT DETECTED Final   Cryptococcus neoformans/gattii NOT DETECTED NOT DETECTED Final   Meth resistant mecA/C and MREJ DETECTED (A) NOT DETECTED Final    Comment: CRITICAL RESULT CALLED TO, READ BACK BY AND VERIFIED WITH: PHARMD J.FRENS AT 0802 ON 08/18/2024 BY T.SAAD. Performed at [facility], [address] [place], [place] [address]   Culture, blood (Routine X 2) w Reflex to ID Panel     Status: Abnormal   Collection Time: 08/17/24  6:01 PM   Specimen: BLOOD RIGHT HAND  Result Value Ref Range Status   Specimen Description BLOOD RIGHT HAND  Final   Special Requests   Final    BOTTLES DRAWN AEROBIC AND ANAEROBIC Blood Culture results may not be optimal due to an inadequate volume of blood received in culture bottles   Culture  Setup Time   Final    GRAM POSITIVE COCCI IN BOTH AEROBIC AND ANAEROBIC BOTTLES CRITICAL VALUE NOTED.  VALUE IS CONSISTENT WITH PREVIOUSLY REPORTED AND CALLED VALUE.    Culture (A)  Final    STAPHYLOCOCCUS AUREUS SUSCEPTIBILITIES PERFORMED ON PREVIOUS CULTURE WITHIN THE LAST 5 DAYS. Performed at [facility], [address], [place], [place] [address]    Report Status 08/21/2024 FINAL  Final   Culture, blood (Routine X 2) w Reflex to ID Panel     Status: Abnormal   Collection Time: 08/19/24 11:15 AM   Specimen: BLOOD  Result Value Ref Range Status   Specimen Description BLOOD BLOOD RIGHT ARM  Final   Special Requests   Final    BOTTLES DRAWN AEROBIC AND ANAEROBIC Blood Culture results may not be optimal due to an inadequate volume of blood received in culture bottles   Culture  Setup Time   Final    GRAM POSITIVE COCCI IN CLUSTERS IN BOTH AEROBIC AND ANAEROBIC BOTTLES Gram Stain Report Called to,Read Back By and Verified With: PHARMD G ABBOTT 08/20/2024 @ 0552 BY AB CRITICAL VALUE NOTED.  VALUE IS CONSISTENT WITH PREVIOUSLY REPORTED AND CALLED VALUE.    Culture (A)  Final    STAPHYLOCOCCUS AUREUS SUSCEPTIBILITIES PERFORMED ON PREVIOUS CULTURE WITHIN THE LAST 5 DAYS. Performed at [facility], [address] [place], [place] [address]    Report Status 08/22/2024 FINAL  Final  Culture, blood (Routine X 2) w Reflex to ID Panel     Status: Abnormal   Collection Time: 08/19/24 11:15 AM   Specimen: BLOOD LEFT ARM  Result Value Ref Range Status   Specimen Description BLOOD LEFT ARM  Final   Special Requests   Final    BOTTLES DRAWN AEROBIC ONLY Blood Culture results may not be optimal due to an inadequate volume of blood received in culture bottles   Culture  Setup Time   Final    GRAM POSITIVE COCCI IN CLUSTERS AEROBIC BOTTLE ONLY CRITICAL VALUE NOTED.  VALUE IS CONSISTENT WITH PREVIOUSLY REPORTED AND CALLED VALUE.    Culture (A)  Final    STAPHYLOCOCCUS AUREUS SUSCEPTIBILITIES PERFORMED ON PREVIOUS CULTURE WITHIN THE LAST 5 DAYS. Performed at [facility], [address] [place], [place] [address]    Report Status 08/22/2024 FINAL  Final  Culture, blood (Routine X 2) w Reflex to ID Panel     Status: None (Preliminary result)   Collection Time: 08/21/24 10:46 AM   Specimen: BLOOD  Result Value Ref Range Status   Specimen Description BLOOD BLOOD RIGHT ARM   Final   Special Requests   Final    BOTTLES DRAWN AEROBIC AND ANAEROBIC Blood Culture adequate volume   Culture   Final    NO GROWTH 2 DAYS Performed at [facility], [address] [place], [place] [address]    Report Status PENDING  Incomplete  Culture, blood (Routine X 2) w Reflex to ID Panel     Status: None (Preliminary result)   Collection Time: 08/21/24 10:46 AM   Specimen: BLOOD  Result Value Ref Range Status   Specimen Description BLOOD BLOOD LEFT HAND  Final   Special Requests   Final    BOTTLES DRAWN AEROBIC AND ANAEROBIC Blood Culture results may not be optimal due to an inadequate volume of blood received in culture bottles   Culture   Final    NO GROWTH 2 DAYS Performed at [facility], [address] [place], [place] [address]    Report Status PENDING  Incomplete     Radiology Studies: No results found.   Nilda Fendt, MD, PhD [facility]  Between 7 am - 7 pm I am available, please contact me via Amion (for emergencies) or Securechat (non urgent messages)  Between 7 pm - 7 am I am not available, please contact night coverage MD/APP via Amion  "

## 2024-08-23 NOTE — Progress Notes (Signed)
"        [facility]  Date of Admission:  08/16/2024      Total days of antibiotics 7          ASSESSMENT: Tanya Hogan is a 73 y.o. female admitted with:   MRSA Bacteremia -  PPM in Situ -  BCx (+) 12/25 and 12/27. Prelim no growth 2d from 12/29.  Given persistent bacteremia and presence of device concern for deep infection high on differential.  She is not able to undergo TEE due to overall medical complexity and frailty.  She is also not an extraction candidate for the same reasons.  Up until the 29th she has had persistently positive cultures but I am hopeful that given no growth day to now we have finally achieved clearance.   We switched her to daptomycin  for safer preferred outpatient antibiotic.  Given she has been to go onto chronic suppression attempts with oral doxycycline  we will do 4 weeks of intravenous antibiotics.   New onset rash - Nonblanching petechial/purpuric rash to lower extremities arms and abdomen/back.  Very characteristic of leukocytoclastic vasculitis which we can see with Staph aureus bacteremia.  We discussed that this is not consistent with any drug rash and is more a immune complex phenomenon that happens in the setting of severe infection.  Fortunately there is no signs of secondary glomerulonephritis.  If there is any concerns we could biopsy in the outpatient setting if other differentials should be considered.   Leg pain associated with vasculitic rash - The rash is quite painful and will take several weeks to resolve now that it seems we have hopefully cleared her bacteremia.  We discussed elevating her legs to reduce pressure and inflammation, cool compresses to help with burning or throbbing.   Hesitant to do any corticosteroids right now until she has finalized most recent blood cultures.  Might consider short course later if we cannot control the pain otherwise.  Multiple drug allergies -  Interestingly she has an  allergy listed to Rifampin.  I cannot find why she would have needed this in the past and she does not recall either.  Either way she is on too many antiarrhythmics that would interfere with candidacy to consider adding this on given the device is remaining.   Vascular Access -  -PICC ordered for tomorrow 1/1 as long as 72 hour update on blood cultures remain w/o growth   Discharge Planning / Coordination of Care -  -Outpatient antibiotics set as long as blood stays clear  -D/W Dr. Trixie - both agree her care needs would likely be better suited in outpatient SNF facility.  - She mentioned going to live with her daughter however she works full-time and is not eligible for leave from what she tells me.  Medication Monitoring -  -Safety labs ordered and detailed below to be followed in OPAT clinic  ID will be available as needed during the rest of her hospital stay.  We will monitor her blood cultures peripherally.    PLAN: Supportive care for rash, no need for biopsy at this time. Not c/w drug eruption  Continue daptomycin  IV  PICC Ordered for 1/1 afternoon   OPAT ORDERS:  Diagnosis: PPM Device Infection   Culture Result: MRSA (S-doxy/tmp-s/linezolid)  Allergies[1]   Discharge antibiotics to be given via PICC line:  Daptomycin  500 mg IV Q24h   Duration: 4 weeks  End Date: 09/18/2024  [facility] Care Per Protocol with Biop[employer] Use:  Home health RN for IV administration and teaching, line care and labs.    Labs weekly while on IV antibiotics: _x_ CBC with differential __ BMP **TWICE WEEKLY ON VANCOMYCIN   _x_ CMP __ CRP __ ESR __ Vancomycin  trough TWICE WEEKLY _x_ CK  _x_ Please pull PIC at completion of IV antibiotics __ Please leave PIC in place until doctor has seen patient or been notified  Fax weekly labs to [phone]  Clinic Follow Up Appt: 08/30/2024 @ 3:00 pm with Corean to check in on rash progress / abx tolerance  08/2624 @ 3:45 pm with Dr. Dennise to  transition to oral doxy for attempt at indefinite suppression    Principal Problem:   MRSA bacteremia Active Problems:   History of left breast cancer   Hypothyroidism   Type 2 diabetes mellitus with hyperlipidemia (HCC)   Essential hypertension   NASH (nonalcoholic steatohepatitis)   Microcytic anemia   Irritable bowel syndrome with constipation   Ventricular tachycardia, unspecified (HCC)   Gastroesophageal reflux disease   COPD (chronic obstructive pulmonary disease) (HCC)   Obesity, Class I, BMI 30-34.9   Acute on chronic systolic CHF (congestive heart failure) (HCC)   Hyponatremia    amiodarone   200 mg Oral Daily   arformoterol   15 mcg Nebulization BID   aspirin   81 mg Oral Daily   budesonide  (PULMICORT ) nebulizer solution  0.25 mg Nebulization BID   Chlorhexidine  Gluconate Cloth  6 each Topical Daily   heparin   5,000 Units Subcutaneous Q8H   insulin  aspart  0-5 Units Subcutaneous QHS   insulin  aspart  0-9 Units Subcutaneous TID WC   lactobacillus  1 g Oral TID WC   levothyroxine   112 mcg Oral Daily   multivitamin with minerals  1 tablet Oral Daily   mouth rinse  15 mL Mouth Rinse 4 times per day   pantoprazole   40 mg Oral QAC breakfast   pravastatin   40 mg Oral QHS   quiNIDine sulfate   200 mg Oral TID   revefenacin   175 mcg Nebulization Daily   sodium chloride  flush  3 mL Intravenous Q12H   sodium chloride  flush  3 mL Intravenous Q12H   spironolactone   12.5 mg Oral Daily    SUBJECTIVE: She is weak and has painful legs and arms where the rash has presented. She feels the swelling in her legs is better but arms is still tender.   No fevers. WBC 10.6k.    Review of Systems: Review of Systems  Constitutional:  Negative for chills and fever.  Cardiovascular:  Positive for leg swelling. Negative for chest pain and palpitations.  Gastrointestinal:  Negative for abdominal pain, diarrhea and vomiting.  Genitourinary: Negative.   Skin:  Positive for rash (tender rash  on legs, dependent areas of arms).  Neurological:  Negative for dizziness and headaches.    Allergies[2]  OBJECTIVE: Vitals:   08/22/24 2317 08/23/24 0300 08/23/24 0555 08/23/24 0747  BP: (!) 98/58 102/65  (!) 117/93  Pulse: 88 89  90  Resp: 20 20 (!) 27 17  Temp: 98.3 F (36.8 C) 97.6 F (36.4 C)    TempSrc: Oral Oral  Oral  SpO2: 97% 95%  96%  Weight:      Height:       Body mass index is 33.33 kg/m.  Physical Exam Vitals reviewed.  Constitutional:      Appearance: She is well-developed. She is ill-appearing.  Cardiovascular:     Rate and Rhythm: Normal rate and regular rhythm.  Heart sounds: No murmur heard. Pulmonary:     Effort: Pulmonary effort is normal.  Neurological:     Mental Status: She is alert.       Lab Results Lab Results  Component Value Date   WBC 10.6 (H) 08/23/2024   HGB 13.7 08/23/2024   HCT 41.3 08/23/2024   MCV 92.8 08/23/2024   PLT 252 08/23/2024    Lab Results  Component Value Date   CREATININE 0.70 08/23/2024   BUN 9 08/23/2024   NA 132 (L) 08/23/2024   K 4.6 08/23/2024   CL 97 (L) 08/23/2024   CO2 27 08/23/2024    Lab Results  Component Value Date   ALT 73 (H) 08/22/2024   AST 112 (H) 08/22/2024   ALKPHOS 234 (H) 08/22/2024   BILITOT 0.6 08/22/2024     Microbiology: Recent Results (from the past 240 hours)  Resp panel by RT-PCR (RSV, Flu A&B, Covid) Anterior Nasal Swab     Status: None   Collection Time: 08/16/24  8:19 AM   Specimen: Anterior Nasal Swab  Result Value Ref Range Status   SARS Coronavirus 2 by RT PCR NEGATIVE NEGATIVE Final   Influenza A by PCR NEGATIVE NEGATIVE Final   Influenza B by PCR NEGATIVE NEGATIVE Final    Comment: (NOTE) The Xpert Xpress SARS-CoV-2/FLU/RSV plus assay is intended as an aid in the diagnosis of influenza from Nasopharyngeal swab specimens and should not be used as a sole basis for treatment. Nasal washings and aspirates are unacceptable for Xpert Xpress  SARS-CoV-2/FLU/RSV testing.  Fact Sheet for Patients: bloggercourse.com  Fact Sheet for Healthcare Providers: seriousbroker.it  This test is not yet approved or cleared by the [place]  FDA and has been authorized for detection and/or diagnosis of SARS-CoV-2 by FDA under an Emergency Use Authorization (EUA). This EUA will remain in effect (meaning this test can be used) for the duration of the COVID-19 declaration under Section 564(b)(1) of the Act, 21 U.S.C. section 360bbb-3(b)(1), unless the authorization is terminated or revoked.     Resp Syncytial Virus by PCR NEGATIVE NEGATIVE Final    Comment: (NOTE) Fact Sheet for Patients: bloggercourse.com  Fact Sheet for Healthcare Providers: seriousbroker.it  This test is not yet approved or cleared by the [place]  FDA and has been authorized for detection and/or diagnosis of SARS-CoV-2 by FDA under an Emergency Use Authorization (EUA). This EUA will remain in effect (meaning this test can be used) for the duration of the COVID-19 declaration under Section 564(b)(1) of the Act, 21 U.S.C. section 360bbb-3(b)(1), unless the authorization is terminated or revoked.  Performed at [facility], [address], [place], [place] [address]   MRSA Next Gen by PCR, Nasal     Status: Abnormal   Collection Time: 08/17/24  4:51 PM   Specimen: Nasal Mucosa; Nasal Swab  Result Value Ref Range Status   MRSA by PCR Next Gen DETECTED (A) NOT DETECTED Final    Comment: RESULT CALLED TO, READ BACK BY AND VERIFIED WITH: L BROWN RN 08/17/2024 @ 2142 BY AB (NOTE) The GeneXpert MRSA Assay (FDA approved for NASAL specimens only), is one component of a comprehensive MRSA colonization surveillance program. It is not intended to diagnose MRSA infection nor to guide or monitor treatment for MRSA infections. Test performance is not FDA  approved in patients less than 31 years old. Performed at [facility], [address], [place], [place] [address]   Culture, blood (Routine X 2) w Reflex  to ID Panel     Status: Abnormal   Collection Time: 08/17/24  5:58 PM   Specimen: BLOOD LEFT HAND  Result Value Ref Range Status   Specimen Description BLOOD LEFT HAND  Final   Special Requests   Final    BOTTLES DRAWN AEROBIC AND ANAEROBIC Blood Culture adequate volume   Culture  Setup Time   Final    GRAM POSITIVE COCCI IN BOTH AEROBIC AND ANAEROBIC BOTTLES CRITICAL RESULT CALLED TO, READ BACK BY AND VERIFIED WITH: PHARMD J.FRENS AT 0802 ON 08/18/2024 BY T.SAAD.    Culture (A)  Final    METHICILLIN RESISTANT STAPHYLOCOCCUS AUREUS Sent to [facility] for further susceptibility testing. Performed at [facility], [address], [place], [place] [address]    Report Status 08/21/2024 FINAL  Final   Organism ID, Bacteria METHICILLIN RESISTANT STAPHYLOCOCCUS AUREUS  Final      Susceptibility   Methicillin resistant staphylococcus aureus - MIC*    CIPROFLOXACIN  >=8 RESISTANT Resistant     ERYTHROMYCIN  >=8 RESISTANT Resistant     GENTAMICIN  <=0.5 SENSITIVE Sensitive     OXACILLIN >=4 RESISTANT Resistant     TETRACYCLINE <=1 SENSITIVE Sensitive     VANCOMYCIN  1 SENSITIVE Sensitive     TRIMETH/SULFA <=10 SENSITIVE Sensitive     CLINDAMYCIN  <=0.25 SENSITIVE Sensitive     RIFAMPIN <=0.5 SENSITIVE Sensitive     Inducible Clindamycin  NEGATIVE Sensitive     LINEZOLID 2 SENSITIVE Sensitive     * METHICILLIN RESISTANT STAPHYLOCOCCUS AUREUS  Blood Culture ID Panel (Reflexed)     Status: Abnormal   Collection Time: 08/17/24  5:58 PM  Result Value Ref Range Status   Enterococcus faecalis NOT DETECTED NOT DETECTED Final   Enterococcus Faecium NOT DETECTED NOT DETECTED Final   Listeria monocytogenes NOT DETECTED NOT DETECTED Final   Staphylococcus species DETECTED (A) NOT DETECTED Final    Comment: CRITICAL RESULT CALLED TO, READ  BACK BY AND VERIFIED WITH: PHARMD J.FRENS AT 0802 ON 08/18/2024 BY T.SAAD.    Staphylococcus aureus (BCID) DETECTED (A) NOT DETECTED Final    Comment: Methicillin (oxacillin)-resistant Staphylococcus aureus (MRSA). MRSA is predictably resistant to beta-lactam antibiotics (except ceftaroline). Preferred therapy is vancomycin  unless clinically contraindicated. Patient requires contact precautions if  hospitalized. CRITICAL RESULT CALLED TO, READ BACK BY AND VERIFIED WITH: PHARMD J.FRENS AT 0802 ON 08/18/2024 BY T.SAAD.    Staphylococcus epidermidis NOT DETECTED NOT DETECTED Final   Staphylococcus lugdunensis NOT DETECTED NOT DETECTED Final   Streptococcus species NOT DETECTED NOT DETECTED Final   Streptococcus agalactiae NOT DETECTED NOT DETECTED Final   Streptococcus pneumoniae NOT DETECTED NOT DETECTED Final   Streptococcus pyogenes NOT DETECTED NOT DETECTED Final   A.calcoaceticus-baumannii NOT DETECTED NOT DETECTED Final   Bacteroides fragilis NOT DETECTED NOT DETECTED Final   Enterobacterales NOT DETECTED NOT DETECTED Final   Enterobacter cloacae complex NOT DETECTED NOT DETECTED Final   Escherichia coli NOT DETECTED NOT DETECTED Final   Klebsiella aerogenes NOT DETECTED NOT DETECTED Final   Klebsiella oxytoca NOT DETECTED NOT DETECTED Final   Klebsiella pneumoniae NOT DETECTED NOT DETECTED Final   Proteus species NOT DETECTED NOT DETECTED Final   Salmonella species NOT DETECTED NOT DETECTED Final   Serratia marcescens NOT DETECTED NOT DETECTED Final   Haemophilus influenzae NOT DETECTED NOT DETECTED Final   Neisseria meningitidis NOT DETECTED NOT DETECTED Final   Pseudomonas aeruginosa NOT DETECTED NOT DETECTED Final   Stenotrophomonas maltophilia NOT DETECTED NOT DETECTED Final   Candida albicans NOT  DETECTED NOT DETECTED Final   Candida auris NOT DETECTED NOT DETECTED Final   Candida glabrata NOT DETECTED NOT DETECTED Final   Candida krusei NOT DETECTED NOT DETECTED Final    Candida parapsilosis NOT DETECTED NOT DETECTED Final   Candida tropicalis NOT DETECTED NOT DETECTED Final   Cryptococcus neoformans/gattii NOT DETECTED NOT DETECTED Final   Meth resistant mecA/C and MREJ DETECTED (A) NOT DETECTED Final    Comment: CRITICAL RESULT CALLED TO, READ BACK BY AND VERIFIED WITH: PHARMD J.FRENS AT 0802 ON 08/18/2024 BY T.SAAD. Performed at [facility], [address] [place], [place] [address]   MIC (1 Drug)-     Status: Abnormal   Collection Time: 08/17/24  5:58 PM  Result Value Ref Range Status   Min Inhibitory Conc (1 Drug) Preliminary report (A)  Final    Comment: (NOTE) Performed At: [facility] [address] [place], [place] [id number] Jennette Shorter MD Ey:[id number]    Source LAB [phone] MRSA DAPTO BLOOS  Corrected    Comment: Performed at [facility], [address], [place], [place] [address] CORRECTED ON 12/30 AT 0800: PREVIOUSLY REPORTED AS BLOOD   MIC Result     Status: Abnormal   Collection Time: 08/17/24  5:58 PM  Result Value Ref Range Status   Result 1 (MIC) Comment (A)  Final    Comment: (NOTE) Methicillin - resistant Staphylococcus aureus Identification performed by account, not confirmed by this laboratory. DAPTOMYCIN  Performed At: [facility] [address] [place], [place] [id number] Jennette Shorter MD Ey:[id number]   Culture, blood (Routine X 2) w Reflex to ID Panel     Status: Abnormal   Collection Time: 08/17/24  6:01 PM   Specimen: BLOOD RIGHT HAND  Result Value Ref Range Status   Specimen Description BLOOD RIGHT HAND  Final   Special Requests   Final    BOTTLES DRAWN AEROBIC AND ANAEROBIC Blood Culture results may not be optimal due to an inadequate volume of blood received in culture bottles   Culture  Setup Time   Final    GRAM POSITIVE COCCI IN BOTH AEROBIC AND ANAEROBIC BOTTLES CRITICAL VALUE NOTED.  VALUE IS CONSISTENT WITH PREVIOUSLY REPORTED AND CALLED VALUE.    Culture (A)  Final     STAPHYLOCOCCUS AUREUS SUSCEPTIBILITIES PERFORMED ON PREVIOUS CULTURE WITHIN THE LAST 5 DAYS. Performed at [facility], [address], [place], [place] [address]    Report Status 08/21/2024 FINAL  Final  Culture, blood (Routine X 2) w Reflex to ID Panel     Status: Abnormal   Collection Time: 08/19/24 11:15 AM   Specimen: BLOOD  Result Value Ref Range Status   Specimen Description BLOOD BLOOD RIGHT ARM  Final   Special Requests   Final    BOTTLES DRAWN AEROBIC AND ANAEROBIC Blood Culture results may not be optimal due to an inadequate volume of blood received in culture bottles   Culture  Setup Time   Final    GRAM POSITIVE COCCI IN CLUSTERS IN BOTH AEROBIC AND ANAEROBIC BOTTLES Gram Stain Report Called to,Read Back By and Verified With: PHARMD G ABBOTT 08/20/2024 @ 0552 BY AB CRITICAL VALUE NOTED.  VALUE IS CONSISTENT WITH PREVIOUSLY REPORTED AND CALLED VALUE.    Culture (A)  Final    STAPHYLOCOCCUS AUREUS SUSCEPTIBILITIES PERFORMED ON PREVIOUS CULTURE WITHIN THE LAST 5 DAYS. Performed at [facility], [address], [place], [place] [address]    Report Status 08/22/2024 FINAL  Final  Culture, blood (Routine X 2) w Reflex to ID Panel     Status: Abnormal   Collection Time: 08/19/24 11:15 AM   Specimen: BLOOD LEFT ARM  Result Value Ref Range Status   Specimen Description BLOOD LEFT ARM  Final   Special Requests   Final    BOTTLES DRAWN AEROBIC ONLY Blood Culture results may not be optimal due to an inadequate volume of blood received in culture bottles   Culture  Setup Time   Final    GRAM POSITIVE COCCI IN CLUSTERS AEROBIC BOTTLE ONLY CRITICAL VALUE NOTED.  VALUE IS CONSISTENT WITH PREVIOUSLY REPORTED AND CALLED VALUE.    Culture (A)  Final    STAPHYLOCOCCUS AUREUS SUSCEPTIBILITIES PERFORMED ON PREVIOUS CULTURE WITHIN THE LAST 5 DAYS. Performed at [facility], [address], [place], [place] [address]    Report Status 08/22/2024 FINAL  Final   Culture, blood (Routine X 2) w Reflex to ID Panel     Status: None (Preliminary result)   Collection Time: 08/21/24 10:46 AM   Specimen: BLOOD  Result Value Ref Range Status   Specimen Description BLOOD BLOOD RIGHT ARM  Final   Special Requests   Final    BOTTLES DRAWN AEROBIC AND ANAEROBIC Blood Culture adequate volume   Culture   Final    NO GROWTH 2 DAYS Performed at [facility], [address] [place], [place] [address]    Report Status PENDING  Incomplete  Culture, blood (Routine X 2) w Reflex to ID Panel     Status: None (Preliminary result)   Collection Time: 08/21/24 10:46 AM   Specimen: BLOOD  Result Value Ref Range Status   Specimen Description BLOOD BLOOD LEFT HAND  Final   Special Requests   Final    BOTTLES DRAWN AEROBIC AND ANAEROBIC Blood Culture results may not be optimal due to an inadequate volume of blood received in culture bottles   Culture   Final    NO GROWTH 2 DAYS Performed at [facility], [address] [place], [place] [address]    Report Status PENDING  Incomplete     Corean Fireman, MSN, NP-C [facility]  [email or web address] Pager: [phone] Office: [phone] [facility] Main Line: [phone] *Secure Chat Communication Welcome       [1]  Allergies Allergen Reactions   Injectafer [Ferric Carboxymaltose] Shortness Of Breath    Mild SOB following injectafer infusion    Penicillins Anaphylaxis and Rash    Tolerated ceftin  2020   Accupril [Quinapril Hcl] Other (See Comments)    Hyperkalemia=high potassium level   Celebrex [Celecoxib] Other (See Comments)    Hx hematemesis, bleeding ulcer   Cozaar  [Losartan  Potassium] Itching   Dml Forte Rash   Entresto [Sacubitril-Valsartan] Other (See Comments)    Hyperkalemia   Flonase  [Fluticasone ] Other (See Comments)    Hx nasal ulcer   Lactose Intolerance (Gi) Diarrhea   Lipitor [Atorvastatin ] Other (See  Comments)    Myalgias    Nasonex  [Mometasone  Furoate] Other (See Comments)    Epistaxis    Nsaids Other (See Comments)    Hx hematemesis, bleeding ulcer   Advil [Ibuprofen] Nausea And Vomiting    Hx hematemesis, bleeding ulcer   Baking Soda-Fluoride [Sodium Fluoride] Itching and Rash   Bayer Aspirin  [Aspirin ] Other (See Comments)    Hx hematemesis, bleeding ulcer   Chamomile Hives   Clindamycin /Lincomycin Rash   Codeine Nausea And Vomiting  Lasix  [Furosemide ] Other (See Comments)    Tinnitus  Hypotension   Miralax  [Polyethylene Glycol (Macrogol)] Other (See Comments)    Leaky gut syndrome   Quinine Rash   Reclast  [Zoledronic  Acid] Rash and Other (See Comments)    Tremors    Rifadin [Rifampin] Other (See Comments)    Unknown reaction   Singulair  [Montelukast ] Other (See Comments)    Dizziness    Sulfa Antibiotics Rash   Ultram  [Tramadol ] Anxiety and Rash   Vesicare  [Solifenacin ] Other (See Comments)    Urinary retention   Wellbutrin  [Bupropion ] Other (See Comments)    Myalgias  Skin crawling sensation   Wool Alcohol  [Lanolin] Rash  [2]  Allergies Allergen Reactions   Injectafer [Ferric Carboxymaltose] Shortness Of Breath    Mild SOB following injectafer infusion    Penicillins Anaphylaxis and Rash    Tolerated ceftin  2020   Accupril [Quinapril Hcl] Other (See Comments)    Hyperkalemia=high potassium level   Celebrex [Celecoxib] Other (See Comments)    Hx hematemesis, bleeding ulcer   Cozaar  [Losartan  Potassium] Itching   Dml Forte Rash   Entresto [Sacubitril-Valsartan] Other (See Comments)    Hyperkalemia   Flonase  [Fluticasone ] Other (See Comments)    Hx nasal ulcer   Lactose Intolerance (Gi) Diarrhea   Lipitor [Atorvastatin ] Other (See Comments)    Myalgias    Nasonex  [Mometasone  Furoate] Other (See Comments)    Epistaxis    Nsaids Other (See Comments)    Hx hematemesis, bleeding ulcer   Advil [Ibuprofen] Nausea And Vomiting    Hx hematemesis, bleeding  ulcer   Baking Soda-Fluoride [Sodium Fluoride] Itching and Rash   Bayer Aspirin  [Aspirin ] Other (See Comments)    Hx hematemesis, bleeding ulcer   Chamomile Hives   Clindamycin /Lincomycin Rash   Codeine Nausea And Vomiting   Lasix  [Furosemide ] Other (See Comments)    Tinnitus  Hypotension   Miralax  [Polyethylene Glycol (Macrogol)] Other (See Comments)    Leaky gut syndrome   Quinine Rash   Reclast  [Zoledronic  Acid] Rash and Other (See Comments)    Tremors    Rifadin [Rifampin] Other (See Comments)    Unknown reaction   Singulair  [Montelukast ] Other (See Comments)    Dizziness    Sulfa Antibiotics Rash   Ultram  [Tramadol ] Anxiety and Rash   Vesicare  [Solifenacin ] Other (See Comments)    Urinary retention   Wellbutrin  [Bupropion ] Other (See Comments)    Myalgias  Skin crawling sensation   Wool Alcohol  [Lanolin] Rash   "

## 2024-08-23 NOTE — Progress Notes (Signed)
 PHARMACY CONSULT NOTE FOR:  OUTPATIENT  PARENTERAL ANTIBIOTIC THERAPY (OPAT)  Indication: MRSA bacteremia Regimen: Daptomycin  500mg  IV q24h End date: 09/18/2024  IV antibiotic discharge orders are pended. To discharging provider:  please sign these orders via discharge navigator,  Select New Orders & click on the button choice - Manage This Unsigned Work.     Thank you for allowing pharmacy to be a part of this patient's care.  Tanya Hogan 08/23/2024, 11:27 AM

## 2024-08-23 NOTE — Progress Notes (Signed)
 "   Progress Note  Patient Name: Tanya Hogan Date of Encounter: 08/23/2024  Primary Cardiologist:   Redell Shallow, MD   Subjective   She is uncomfortable with joint pain and diffuse edema.  No acute SOB.  No chest pain.   Inpatient Medications    Scheduled Meds:  amiodarone   200 mg Oral Daily   arformoterol   15 mcg Nebulization BID   aspirin   81 mg Oral Daily   budesonide  (PULMICORT ) nebulizer solution  0.25 mg Nebulization BID   Chlorhexidine  Gluconate Cloth  6 each Topical Daily   heparin   5,000 Units Subcutaneous Q8H   insulin  aspart  0-5 Units Subcutaneous QHS   insulin  aspart  0-9 Units Subcutaneous TID WC   lactobacillus  1 g Oral TID WC   levothyroxine   112 mcg Oral Daily   multivitamin with minerals  1 tablet Oral Daily   mouth rinse  15 mL Mouth Rinse 4 times per day   pantoprazole   40 mg Oral QAC breakfast   pravastatin   40 mg Oral QHS   quiNIDine sulfate   200 mg Oral TID   revefenacin   175 mcg Nebulization Daily   sodium chloride  flush  3 mL Intravenous Q12H   sodium chloride  flush  3 mL Intravenous Q12H   spironolactone   12.5 mg Oral Daily   Continuous Infusions:  DAPTOmycin  500 mg (08/22/24 1141)   PRN Meds: acetaminophen  **OR** acetaminophen , levalbuterol , ondansetron  **OR** ondansetron  (ZOFRAN ) IV, mouth rinse, oxyCODONE , senna-docusate, traZODone    Vital Signs    Vitals:   08/22/24 2317 08/23/24 0300 08/23/24 0555 08/23/24 0747  BP: (!) 98/58 102/65  (!) 117/93  Pulse: 88 89  90  Resp: 20 20 (!) 27 17  Temp: 98.3 F (36.8 C) 97.6 F (36.4 C)    TempSrc: Oral Oral  Oral  SpO2: 97% 95%  96%  Weight:      Height:        Intake/Output Summary (Last 24 hours) at 08/23/2024 0900 Last data filed at 08/23/2024 0802 Gross per 24 hour  Intake 246 ml  Output 500 ml  Net -254 ml   Filed Weights   08/19/24 0406 08/20/24 0641 08/22/24 0411  Weight: 80.9 kg 80.8 kg 80 kg    Telemetry    AV paced with PVCs - Personally Reviewed  ECG     NA - Personally Reviewed  Physical Exam   GEN: No acute distress.   Neck: No  JVD Cardiac: RRR, no murmurs, rubs, or gallops.  Respiratory: Clear  to auscultation bilaterally. GI: Soft, nontender, non-distended  MS:  Moderate diffuse edema edema; No deformity. Skin:  Diffuse petechiae.  Neuro:  Nonfocal  Psych: Normal affect   Labs    Chemistry Recent Labs  Lab 08/20/24 0340 08/21/24 0400 08/22/24 0516  NA 134* 134* 133*  K 3.7 4.1 4.3  CL 99 101 100  CO2 28 24 25   GLUCOSE 112* 108* 112*  BUN 19 13 9   CREATININE 0.68 0.58 0.65  CALCIUM  9.1 9.5 9.5  PROT 6.0* 6.0* 5.9*  ALBUMIN 2.8* 2.8* 2.7*  AST 144* 114* 112*  ALT 88* 77* 73*  ALKPHOS 208* 206* 234*  BILITOT 0.7 0.6 0.6  GFRNONAA >60 >60 >60  ANIONGAP 7 9 8      Hematology Recent Labs  Lab 08/19/24 1115 08/20/24 0340 08/23/24 0452  WBC 13.2* 10.8* 10.2  RBC 4.21 4.07 4.37  HGB 12.7 12.6 13.7  HCT 39.5 37.8 41.4  MCV 93.8 92.9 94.7  MCH  30.2 31.0 31.4  MCHC 32.2 33.3 33.1  RDW 14.5 14.5 14.7  PLT 157 176 224    Cardiac EnzymesNo results for input(s): TROPONINI in the last 168 hours. No results for input(s): TROPIPOC in the last 168 hours.   BNP Recent Labs  Lab 08/17/24 0449 08/18/24 0557  PROBNP 22,372.0* 10,332.0*     DDimer No results for input(s): DDIMER in the last 168 hours.   Radiology    No results found.  Cardiac Studies    ECHO:  08/17/24  1. Left ventricular ejection fraction, by estimation, is <20%. The left  ventricle has severely decreased function. The left ventricle demonstrates  global hypokinesis. The left ventricular internal cavity size was severely  dilated. Left ventricular  diastolic parameters are consistent with Grade III diastolic dysfunction  (restrictive).   2. Right ventricular systolic function is moderately reduced. The right  ventricular size is moderately enlarged. There is moderately elevated  pulmonary artery systolic pressure. The  estimated right ventricular  systolic pressure is 48.4 mmHg.   3. Left atrial size was severely dilated.   4. Right atrial size was severely dilated.   5. A small pericardial effusion is present. The pericardial effusion is  anterior to the right ventricle. There is no evidence of cardiac  tamponade.   6. The mitral valve is normal in structure. Trivial mitral valve  regurgitation. No evidence of mitral stenosis.   7. The aortic valve is tricuspid. There is mild calcification of the  aortic valve. Aortic valve regurgitation is not visualized. Aortic valve  sclerosis/calcification is present, without any evidence of aortic  stenosis.   8. The inferior vena cava is dilated in size with <50% respiratory  variability, suggesting right atrial pressure of 15 mmHg.    Patient Profile     73 y.o. female with a NICM/CHB, HFrEF likely in part 2/2 pacing-induced CM s/p LEFT MDT CRTP (RA/RV/CS-AIV lead) with subsequent addition of RV-LBaP lead (11/03/21) with subsequent addition of RV-d lead (05/31/24) for ATP for refractory VT, DM2, NASH cirrhosis who presented with lethargy and respiratory distress and is now found to have 4/4 cultures with MRSA.   Assessment & Plan    Bacteremia:  MRSA.  Plan is suppressive antibiotic therapy.  No plan for lead extraction.  ID notes reviewed and we are holding off on TEE given her frailty .  We will await her response to suppressive antibiotics and if bactermia persists we will need to have further discussion about goals of therapy.  Given her frailty we would lean away from high risk lead extraction in the future.    Her blood cultures from 12/29 remain negative.    Non ischemic cardiomyopathy:  Unable to titrate meds secondary to hypotension.    VT:  No sustained arrhythmias.  Continue suppressive treatment with amio and quinidine.     Petechiae:  While this could be a drug reaction, I would be inclined to think that it is related to the infection itself and  possibly a concurrent vasculitis.   Platelets are OK.  This could be related to the platelets themselves.  Despite the negative cultures, I worry about continued and potentially overwhelming infection to explain rash and worsening joint pain.  I will talk to EP again but I think lead removal would be very high risk and I would not suggest this if cultures truly remain negative.    For questions or updates, please contact [facility] Please consult [email or web address] for contact info under Cardiology/STEMI.  Signed, Lynwood Schilling, MD  08/23/2024, 9:00 AM    "

## 2024-08-23 NOTE — Progress Notes (Signed)
 "                                                                                                                                                                                                       Daily Progress Note   Patient Name: Tanya Hogan       Date: 08/23/2024 DOB: [birth year]  Age: 73 y.o. MRN#: [id number] Attending Physician: Trixie Nilda HERO, MD Primary Care Physician: [name], MD Admit Date: 08/16/2024  Reason for Consultation/Follow-up: Establishing goals of care  Subjective: Medical records reviewed including progress notes, labs, imaging. Patient assessed at the bedside.  She reports at least 5 out of 10 pain despite receiving pain medications earlier this morning.  She attributes this to the rash all over her body.  She has BiPAP mask in place.  No visitors present during my visit.  Discussed with RN, requested Tylenol  for patient.  Created space and opportunity for patient's thoughts and feelings on her current illness.  She confirmed having previous conversations with my colleague over the weekend.  Patient tells me she is unable to even think today due to the pain from her rashes.  She does not want to take any more medicines than she has to, sharing her concern that her body cannot take it.  She is agreeable to another attempt at a later date for further goals of care discussions.  Emotional support and therapeutic listening was provided.  Questions and concerns addressed. PMT will continue to support holistically.   Length of Stay: 7   Physical Exam Vitals and nursing note reviewed.  Constitutional:      General: She is not in acute distress.    Appearance: She is ill-appearing.  HENT:     Head: Normocephalic and atraumatic.  Cardiovascular:     Rate and Rhythm: Normal rate.  Pulmonary:     Effort: Pulmonary effort is normal.  Skin:    General: Skin is dry.     Findings: Petechiae present.     Comments: Diffuse rash over bilateral feet   Neurological:     Mental Status: She is alert.  Psychiatric:        Behavior: Behavior normal.            Vital Signs: BP (!) 117/93 (BP Location: Left Arm)   Pulse 90   Temp 97.6 F (36.4 C) (Oral)   Resp 17   Ht 5' 1 (1.549 m)   Wt 80 kg   SpO2 96%   BMI 33.33 kg/m  SpO2: SpO2: 96 % O2 Device: O2  Device: Room Air O2 Flow Rate: O2 Flow Rate (L/min): 2 L/min  Palliative Care Assessment & Plan   Patient Profile: 73 y.o. female  with past medical history of recent revision of pacemaker/AICD, breast cancer, end stage chronic heart failure, nonalcoholic cirrhosis, BiV pacemake, depression, DM2, GERD, HTN, hypothyroidism, valvular heart disease, pulmonary hypertension, sleep apnea and ventricular tachy arrhythmia  admitted on 08/16/2024 with swelling and difficulty breathing.    Admitted for acute on chronic HF. On 12/25 she became minimally responsive requiring BiPAP. She was found to have MRSA bacteremia.   She has six inpatient hospitalizations in the last six months.   Recommendations/Plan: Continue DNR/DNI Continue current care plan Unable to have detailed GOC conversation with patient today due to pain she is experiencing from her rash Continued PMT support   Care plan was discussed with patient, bedside RN   Mickle Fell, PA-C Palliative Medicine Team Team phone # [phone]  Thank you for allowing the Palliative Medicine Team to assist in the care of this patient. Please utilize secure chat with additional questions, if there is no response within 30 minutes please call the above phone number.  Palliative Medicine Team providers are available by phone from 7am to 7pm daily and can be reached through the team cell phone.  Should this patient require assistance outside of these hours, please call the patient's attending physician.   Time Total: 25  Visit consisted of counseling and education dealing with the complex and emotionally intense issues of  symptom management and palliative care in the setting of serious and potentially life-threatening illness. Greater than 50% of this time was spent counseling and coordinating care related to the above assessment and plan.  Personally spent 25 minutes in patient care including extensive chart review (labs, imaging, progress/consult notes, vital signs), medically appropraite exam, discussed with treatment team, education to patient, family, and staff, documenting clinical information, medication review and management, coordination of care, and available advanced directive documents.    "

## 2024-08-23 NOTE — Progress Notes (Signed)
 Physical Therapy Treatment Patient Details Name: Tanya Hogan MRN: [id number] DOB: 06/07/1951 Today's Date: 08/23/2024   History of Present Illness Pt is 73 yo with recent hospitalization on 12/11 with swelling and difficulty breathing. Currently pt was admitted 12/24 for acute on chronic HF. 12/25 was minimally responsive requiring bipap. Blood cultures showed MRSA. EFY:[id number] revision of pacemaker/AICD, breast cancer, chronic heart failure, nonalcoholic cirrhosis, BiV pacemake, depression, DM2, GERD, HTN, hypothyroidism, valvular heart disease, pulmonary hypertension, sleep apnea and ventricular tachy arrhythmia    PT Comments  Patient with limited progress due to pain from rash and swelling in joints.  She was able to stand with time and min A and ambulated short distance in the room with RW.  She had been in chair all night (reports claustrophobia in bed,) though fatigued requesting to sleep end of session.  PT will continue to follow, hopeful for resolution of rash and improved tolerance for eventual d/c home.   If plan is discharge home, recommend the following: A little help with walking and/or transfers;A little help with bathing/dressing/bathroom;Assist for transportation;Help with stairs or ramp for entrance;Assistance with cooking/housework   Can travel by private vehicle        Equipment Recommendations  None recommended by PT    Recommendations for Other Services       Precautions / Restrictions Precautions Precautions: Fall Recall of Precautions/Restrictions: Impaired Precaution/Restrictions Comments: watch BP     Mobility  Bed Mobility Overal bed mobility: Needs Assistance Bed Mobility: Sit to Supine       Sit to supine: Mod assist   General bed mobility comments: in recliner upon entry, reports up all night; to supine assist for legs into bed with cues for upper trunk positioning.    Transfers Overall transfer level: Needs assistance Equipment used:  Rolling [name] Transfers: Sit to/from Stand Sit to Stand: Min assist           General transfer comment: increased time, difficulty initially with increased help then pt requested to let me do it and less help needed    Ambulation/Gait Ambulation/Gait assistance: Contact guard assist Gait Distance (Feet): 20 Feet Assistive device: Rolling [name] Gait Pattern/deviations: Step-to pattern, Step-through pattern, Decreased stride length, Trunk flexed, Wide base of support       General Gait Details: limited by pain, to door and back then sat on EOB   Stairs             Wheelchair Mobility     Tilt Bed    Modified Rankin (Stroke Patients Only)       Balance Overall balance assessment: Needs assistance   Sitting balance-[name] Scale: Fair     Standing balance support: Bilateral upper extremity supported Standing balance-[name] Scale: Poor Standing balance comment: Reliant on RW                            Communication Communication Communication: No apparent difficulties  Cognition Arousal: Alert Behavior During Therapy: Anxious   PT - Cognitive impairments: No apparent impairments                       PT - Cognition Comments: anxious due to pain and difficulty mobilizing Following commands: Intact      Cueing Cueing Techniques: Verbal cues  Exercises      General Comments General comments (skin integrity, edema, etc.): rash throughout lower legs and up on posterior and anterior  thighs, L wrist edema, both hands with difficulty holding things per pt; set up to brush teeth initially per pt request, assist for applying tooth paste to brush, and for set up with increased time to grasp items (cup, brush, bottle of mouth wash) etc; help to apply c-pap end of session, RN aware      Pertinent Vitals/Pain Pain Assessment Pain Assessment: Faces Faces Pain Scale: Hurts even more Pain Location: all over Pain  Descriptors / Indicators: Aching, Discomfort, Grimacing, Guarding, Tender Pain Intervention(s): Monitored during session, Repositioned, Patient requesting pain meds-RN notified, Limited activity within patient's tolerance    Home Living                          Prior Function            PT Goals (current goals can now be found in the care plan section) Progress towards PT goals: Not progressing toward goals - comment    Frequency    Min 2X/week      PT Plan      Co-evaluation              AM-PAC PT 6 Clicks Mobility   Outcome Measure  Help needed turning from your back to your side while in a flat bed without using bedrails?: A Little Help needed moving from lying on your back to sitting on the side of a flat bed without using bedrails?: A Little Help needed moving to and from a bed to a chair (including a wheelchair)?: A Little Help needed standing up from a chair using your arms (e.g., wheelchair or bedside chair)?: A Little Help needed to walk in hospital room?: A Little Help needed climbing 3-5 steps with a railing? : Total 6 Click Score: 16    End of Session Equipment Utilized During Treatment: Gait belt Activity Tolerance: Patient limited by pain Patient left: in bed;with bed alarm set;with call bell/phone within reach   PT Visit Diagnosis: Unsteadiness on feet (R26.81);Other abnormalities of gait and mobility (R26.89);Muscle weakness (generalized) (M62.81);Pain;Difficulty in walking, not elsewhere classified (R26.2) Pain - Right/Left:  (both) Pain - part of body: Ankle and joints of foot;Leg     Time: 1020-1051 PT Time Calculation (min) (ACUTE ONLY): 31 min  Charges:    $Gait Training: 8-22 mins $Therapeutic Activity: 8-22 mins PT General Charges $$ ACUTE PT VISIT: 1 Visit                     Micheline Portal, PT Acute Rehabilitation Services Office:[phone] 08/23/2024    Montie Portal 08/23/2024, 2:01 PM

## 2024-08-24 ENCOUNTER — Inpatient Hospital Stay (HOSPITAL_COMMUNITY)

## 2024-08-24 DIAGNOSIS — I509 Heart failure, unspecified: Secondary | ICD-10-CM | POA: Diagnosis not present

## 2024-08-24 DIAGNOSIS — R471 Dysarthria and anarthria: Secondary | ICD-10-CM | POA: Diagnosis not present

## 2024-08-24 DIAGNOSIS — I110 Hypertensive heart disease with heart failure: Secondary | ICD-10-CM | POA: Diagnosis not present

## 2024-08-24 DIAGNOSIS — B9562 Methicillin resistant Staphylococcus aureus infection as the cause of diseases classified elsewhere: Secondary | ICD-10-CM | POA: Diagnosis not present

## 2024-08-24 DIAGNOSIS — Z7189 Other specified counseling: Secondary | ICD-10-CM | POA: Diagnosis not present

## 2024-08-24 DIAGNOSIS — Z7982 Long term (current) use of aspirin: Secondary | ICD-10-CM | POA: Diagnosis not present

## 2024-08-24 DIAGNOSIS — Z950 Presence of cardiac pacemaker: Secondary | ICD-10-CM | POA: Diagnosis not present

## 2024-08-24 DIAGNOSIS — R29810 Facial weakness: Secondary | ICD-10-CM | POA: Diagnosis not present

## 2024-08-24 DIAGNOSIS — R52 Pain, unspecified: Secondary | ICD-10-CM | POA: Diagnosis not present

## 2024-08-24 DIAGNOSIS — I5023 Acute on chronic systolic (congestive) heart failure: Secondary | ICD-10-CM | POA: Diagnosis not present

## 2024-08-24 DIAGNOSIS — Z515 Encounter for palliative care: Secondary | ICD-10-CM | POA: Diagnosis not present

## 2024-08-24 DIAGNOSIS — R531 Weakness: Secondary | ICD-10-CM | POA: Diagnosis not present

## 2024-08-24 DIAGNOSIS — R7881 Bacteremia: Secondary | ICD-10-CM | POA: Diagnosis not present

## 2024-08-24 LAB — CBC
HCT: 38.8 % (ref 36.0–46.0)
Hemoglobin: 12.8 g/dL (ref 12.0–15.0)
MCH: 30.5 pg (ref 26.0–34.0)
MCHC: 33 g/dL (ref 30.0–36.0)
MCV: 92.6 fL (ref 80.0–100.0)
Platelets: 228 K/uL (ref 150–400)
RBC: 4.19 MIL/uL (ref 3.87–5.11)
RDW: 14.9 % (ref 11.5–15.5)
WBC: 11.9 K/uL — ABNORMAL HIGH (ref 4.0–10.5)
nRBC: 0 % (ref 0.0–0.2)

## 2024-08-24 LAB — COMPREHENSIVE METABOLIC PANEL WITH GFR
ALT: 55 U/L — ABNORMAL HIGH (ref 0–44)
AST: 83 U/L — ABNORMAL HIGH (ref 15–41)
Albumin: 2.9 g/dL — ABNORMAL LOW (ref 3.5–5.0)
Alkaline Phosphatase: 205 U/L — ABNORMAL HIGH (ref 38–126)
Anion gap: 9 (ref 5–15)
BUN: 12 mg/dL (ref 8–23)
CO2: 26 mmol/L (ref 22–32)
Calcium: 9.9 mg/dL (ref 8.9–10.3)
Chloride: 99 mmol/L (ref 98–111)
Creatinine, Ser: 0.71 mg/dL (ref 0.44–1.00)
GFR, Estimated: >60 mL/min
Glucose, Bld: 117 mg/dL — ABNORMAL HIGH (ref 70–99)
Potassium: 4.8 mmol/L (ref 3.5–5.1)
Sodium: 133 mmol/L — ABNORMAL LOW (ref 135–145)
Total Bilirubin: 0.7 mg/dL (ref 0.0–1.2)
Total Protein: 6 g/dL — ABNORMAL LOW (ref 6.5–8.1)

## 2024-08-24 LAB — GLUCOSE, CAPILLARY
Glucose-Capillary: 132 mg/dL — ABNORMAL HIGH (ref 70–99)
Glucose-Capillary: 120 mg/dL — ABNORMAL HIGH (ref 70–99)
Glucose-Capillary: 124 mg/dL — ABNORMAL HIGH (ref 70–99)
Glucose-Capillary: 106 mg/dL — ABNORMAL HIGH (ref 70–99)
Glucose-Capillary: 136 mg/dL — ABNORMAL HIGH (ref 70–99)

## 2024-08-24 LAB — MAGNESIUM: Magnesium: 1.9 mg/dL (ref 1.7–2.4)

## 2024-08-24 MED ORDER — IOHEXOL 350 MG/ML SOLN
75.0000 mL | Freq: Once | INTRAVENOUS | Status: AC | PRN
  Administered 2024-08-24: 75 mL via INTRAVENOUS

## 2024-08-24 NOTE — Progress Notes (Signed)
" °   08/24/24 1703  Assess: MEWS Score  Temp 98 F (36.7 C)  BP (!) 100/59  MAP (mmHg) 69  Pulse Rate 89  ECG Heart Rate 90  Resp (!) 22  SpO2 100 %  O2 Device Room Air  Assess: MEWS Score  MEWS Temp 0  MEWS Systolic 1  MEWS Pulse 0  MEWS RR 1  MEWS LOC 0  MEWS Score 2  MEWS Score Color Yellow  Assess: if the MEWS score is Yellow or Red  Were vital signs accurate and taken at a resting state? Yes  Does the patient meet 2 or more of the SIRS criteria? No  MEWS guidelines implemented  Yes, yellow  Treat  MEWS Interventions Considered administering scheduled or prn medications/treatments as ordered  Take Vital Signs  Increase Vital Sign Frequency  Yellow: Q2hr x1, continue Q4hrs until patient remains green for 12hrs  Escalate  MEWS: Escalate Yellow: Discuss with charge nurse and consider notifying provider and/or RRT  Notify: Charge Nurse/RN  Name of Charge Nurse/RN Notified Lexicographer  Provider Notification  Provider Name/Title Dr. trixie  Date Provider Notified 08/24/24  Time Provider Notified 1713  Method of Notification Face-to-face  Provider response Evaluate remotely  Date of Provider Response 08/24/24  Time of Provider Response 1703  Notify: Rapid Response  Name of Rapid Response RN Notified Hannah RN  Date Rapid Response Notified 08/24/24  Time Rapid Response Notified 1702  Assess: SIRS CRITERIA  SIRS Temperature  0  SIRS Respirations  1  SIRS Pulse 0  SIRS WBC 0  SIRS Score Sum  1    "

## 2024-08-24 NOTE — Progress Notes (Signed)
 Per Rapid Reponse RN CT without contrast to be done and then will determine if contrast is needed.  They will enter IV team consult if assistance needed to obtain larger gauge IV at that time.

## 2024-08-24 NOTE — Progress Notes (Signed)
 "  Rounding Note   Patient Name: Tanya Hogan Date of Encounter: 08/24/2024  [facility] Cardiologist: Redell Shallow, MD   Subjective Denies dyspnea or CP; complains of bilateral lower ext pain  Scheduled Meds:  amiodarone   200 mg Oral Daily   arformoterol   15 mcg Nebulization BID   aspirin   81 mg Oral Daily   budesonide  (PULMICORT ) nebulizer solution  0.25 mg Nebulization BID   Chlorhexidine  Gluconate Cloth  6 each Topical Daily   heparin   5,000 Units Subcutaneous Q8H   insulin  aspart  0-5 Units Subcutaneous QHS   insulin  aspart  0-9 Units Subcutaneous TID WC   lactobacillus  1 g Oral TID WC   levothyroxine   112 mcg Oral Daily   multivitamin with minerals  1 tablet Oral Daily   mouth rinse  15 mL Mouth Rinse 4 times per day   pantoprazole   40 mg Oral QAC breakfast   pravastatin   40 mg Oral QHS   quiNIDine sulfate   200 mg Oral TID   revefenacin   175 mcg Nebulization Daily   sodium chloride  flush  3 mL Intravenous Q12H   sodium chloride  flush  3 mL Intravenous Q12H   spironolactone   12.5 mg Oral Daily   Continuous Infusions:  DAPTOmycin  100 mL/hr at 08/23/24 1534   PRN Meds: acetaminophen  **OR** acetaminophen , HYDROcodone -acetaminophen , levalbuterol , ondansetron  **OR** ondansetron  (ZOFRAN ) IV, mouth rinse, senna-docusate, traZODone    Vital Signs  Vitals:   08/23/24 2013 08/23/24 2020 08/23/24 2324 08/24/24 0300  BP:   96/71 114/72  Pulse:  88 100 89  Resp: 20 (!) 21 20 (!) 22  Temp:  (!) 97.5 F (36.4 C) (!) 97.5 F (36.4 C) (!) 97.5 F (36.4 C)  TempSrc:  Oral Oral Oral  SpO2:  90% 90% 90%  Weight:      Height:        Intake/Output Summary (Last 24 hours) at 08/24/2024 0730 Last data filed at 08/23/2024 1534 Gross per 24 hour  Intake 76.37 ml  Output --  Net 76.37 ml      08/23/2024    5:55 AM 08/22/2024    4:11 AM 08/20/2024    6:41 AM  Last 3 Weights  Weight (lbs) -- 176 lb 6.4 oz 178 lb 2.1 oz  Weight (kg) -- 80.015 kg 80.8 kg       Telemetry AV paced - Personally Reviewed   Physical Exam  GEN: No acute distress.   Neck: supple Cardiac: RRR Respiratory: Clear to auscultation bilaterally. GI: Soft, nontender, non-distended  MS: 1+ edema; rash bilaterally Neuro:  Nonfocal  Psych: Normal affect   Labs High Sensitivity Troponin:   Recent Labs  Lab 08/02/24 1942 08/02/24 2142  TROPONINIHS 19* 21*    Recent Labs  Lab 08/16/24 0707  TRNPT 33*       Chemistry Recent Labs  Lab 08/20/24 0340 08/21/24 0400 08/22/24 0516 08/23/24 0852 08/24/24 0522  NA 134* 134* 133* 132* 133*  K 3.7 4.1 4.3 4.6 4.8  CL 99 101 100 97* 99  CO2 28 24 25 27 26   GLUCOSE 112* 108* 112* 120* 117*  BUN 19 13 9 9 12   CREATININE 0.68 0.58 0.65 0.70 0.71  CALCIUM  9.1 9.5 9.5 9.8 9.9  MG 2.1 2.0  --   --  1.9  PROT 6.0* 6.0* 5.9*  --  6.0*  ALBUMIN 2.8* 2.8* 2.7*  --  2.9*  AST 144* 114* 112*  --  83*  ALT 88* 77* 73*  --  55*  ALKPHOS 208* 206* 234*  --  205*  BILITOT 0.7 0.6 0.6  --  0.7  GFRNONAA >60 >60 >60 >60 >60  ANIONGAP 7 9 8 8 9      Hematology Recent Labs  Lab 08/23/24 0452 08/23/24 0852 08/24/24 0522  WBC 10.2 10.6* 11.9*  RBC 4.37 4.45 4.19  HGB 13.7 13.7 12.8  HCT 41.4 41.3 38.8  MCV 94.7 92.8 92.6  MCH 31.4 30.8 30.5  MCHC 33.1 33.2 33.0  RDW 14.7 14.7 14.9  PLT 224 252 228    BNP Recent Labs  Lab 08/18/24 0557  PROBNP 10,332.0*      Radiology  US  EKG SITE RITE Result Date: 08/23/2024 If Site Rite image not attached, placement could not be confirmed due to current cardiac rhythm.   Patient Profile   74 y.o. female with a NICM/CHB, HFrEF likely in part 2/2 pacing-induced CM s/p LEFT MDT CRTP (RA/RV/CS-AIV lead) with subsequent addition of RV-LBaP lead (11/03/21) with subsequent addition of RV-d lead (05/31/24) for ATP for refractory VT, DM2, NASH cirrhosis who presented with lethargy and respiratory distress and is now found to have 4/4 cultures with MRSA.  Echocardiogram this  admission shows ejection fraction less than 20%, severe left ventricular enlargement, grade 3 diastolic dysfunction, moderate right ventricular enlargement, moderate RV dysfunction, severe biatrial enlargement, small pericardial effusion.  Assessment & Plan   1 bacteremia-MRSA bacteremia noted.  1 of 2 blood cultures from December 29 now positive.  Patient is felt not to be a good candidate for transesophageal echocardiogram or device extraction.  Continue daptomycin .  Infectious disease following.  Rashes felt secondary to leukocytoclastic vasculitis secondary to Staph aureus bacteremia and less likely to be consistent with drug rash.  2 nonischemic cardiomyopathy/acute on chronic systolic congestive heart failure-volume status appears to be reasonable at present.  Will continue spironolactone  at present dose.  Will likely need to resume Demadex  in the next 24 to 48 hours.  She apparently has had an allergy to ARBs and Entresto.  Also blood pressure is borderline and not clear she would tolerate.  Could consider low-dose carvedilol  at discharge.  3 history of ventricular tachycardia-continue amiodarone  and quinidine.  No significant arrhythmias over the last 24 hours on telemetry.  4 status post CRT-P-as above felt not to be a candidate for device extraction.  5 no CODE BLUE   For questions or updates, please contact [facility] Please consult [email or web address] for contact info under  Signed, Redell Shallow, MD  08/24/2024, 7:30 AM    "

## 2024-08-24 NOTE — Code Documentation (Signed)
 Stroke Response Nurse Documentation Code Documentation  Tanya Hogan is a 74 y.o. female who is currently admitted to 4E for systemic MRSA bacteremia with past medical hx of HTN, heart failure (with an AV pacer), DM, and obestiy. On aspirin  81 mg daily. Code stroke was activated by bedside RN, Sidra.  She was last seen normal around 1300 by bedside nurse. Bedside nurse reported that he said she's had generalized pain and weakness, but the patient reported it has worsened. SRN called by RRT, who advised the bedside RN to activate a code stroke  Stroke team at the bedside, and patient to CT with team. NIHSS 7, see documentation for details and code stroke times. Patient with not following commands, left facial droop, bilateral arm weakness, bilateral leg weakness, and dysarthria on exam.  The following imaging was completed:  CT Head and CTA. Patient is not a candidate for IV Thrombolytic due to stroke not suspected. Patient is not a candidate for IR due to LVO negative imaging.   Care Plan: Q2hr VSS/NIHSS. Repeat [facility] Swallow screen when safe to do so before encouraging PO intake.   Process Delays Noted: IV access--patient needed 20g or higher for CTA.  Discussed updated careplan with Josh, and encouraged to reach out for any questions to [facility] team [phone] or Neurology team.    Paelyn Smick M Balraj Brayfield  Stroke Response RN

## 2024-08-24 NOTE — Progress Notes (Signed)
 " PROGRESS NOTE  Tanya Hogan FMW:[id number] DOB: May 16, 1951 DOA: 08/16/2024 PCP: [name], MD   LOS: 8 days   Brief Narrative / Interim history: 74 year old female with history of chronic systolic CHF, HTN, PVD, DM2, presence of pacemaker, hypothyroidism comes into the hospital with shortness of breath.  Of note, she was recently hospitalized mid-December for CHF exacerbation, sent home after diuresis, but developed recurrent dyspnea and lower extremity edema at home and came back to the ED.  She was found to have MRSA bacteremia.  ID consulted and following.  Due to presence of pacemaker cardiology consulted as well.  She is deemed to be a very poor candidate for pacer lead extraction given end-stage heart failure and poor prognosis.  Palliative consulted as well  Subjective / 24h Interval events: Lots of pains and aches in various parts of the body, right heel, left hand, the sites of the rash that sit against hard surfaces.  No chest pain, no shortness of breath.  She is emotional at times given everything that is going on  Assesement and Plan: Principal problem MRSA bacteremia -ID following, appreciate input.  Currently she is on daptomycin .  She has a pacemaker in place but not a candidate to remove so likely needs suppressive prolonged antibiotics - A TEE was not pursued due to high risk.  Blood cultures were positive for MRSA on 12/25 and 12/27, fortunately she now appears to have cleared the bacteremia as blood cultures on 12/29 appear to be without growth to date - Discussed with ID, she will need 4 weeks of IV antibiotics, will have a PICC line placed.  Following that, she will be on lifelong suppressive antibiotics with oral agents  Active problems Skin rash -in the setting of MRSA bacteremia, possibly IgA vasculitis.  Continue to closely monitor, slightly improving today  Acute on chronic systolic CHF -most recent 2D echo showed LVEF less than 20%, global hypokinesis,  severe LV dilatation, grade 3 diastolic dysfunction, RV systolic function moderate reduction, RVSP 48, biatrial severe dilatation and small pericardial effusion.  She was diuresed - Volume status better now.  Cardiology following, plan to resume Demadex  within the next 1 to 2 days  CHB-with pacemaker.  Recently had subsequent addition of RV-the lead on October 2025 for ATP for refractory VT  History of VT-no arrhythmias.  Continue suppressive treatment with amiodarone  as well as quinidine  Hypothyroidism -continue Synthroid   Essential hypertension-continue spironolactone   NASH cirrhosis-no signs of decompensation  Underlying COPD-no wheezing, stable, continue bronchodilator therapy  History of left breast cancer-outpatient follow-up, in remission  Microcytic anemia-continue to monitor, no bleeding  Hyponatremia-overall stable, no large shifts noted  Obesity, class I-BMI 33.3.    Scheduled Meds:  amiodarone   200 mg Oral Daily   arformoterol   15 mcg Nebulization BID   aspirin   81 mg Oral Daily   budesonide  (PULMICORT ) nebulizer solution  0.25 mg Nebulization BID   Chlorhexidine  Gluconate Cloth  6 each Topical Daily   heparin   5,000 Units Subcutaneous Q8H   insulin  aspart  0-5 Units Subcutaneous QHS   insulin  aspart  0-9 Units Subcutaneous TID WC   lactobacillus  1 g Oral TID WC   levothyroxine   112 mcg Oral Daily   multivitamin with minerals  1 tablet Oral Daily   mouth rinse  15 mL Mouth Rinse 4 times per day   pantoprazole   40 mg Oral QAC breakfast   pravastatin   40 mg Oral QHS   quiNIDine sulfate   200  mg Oral TID   revefenacin   175 mcg Nebulization Daily   sodium chloride  flush  3 mL Intravenous Q12H   sodium chloride  flush  3 mL Intravenous Q12H   spironolactone   12.5 mg Oral Daily   Continuous Infusions:  DAPTOmycin  100 mL/hr at 08/23/24 1534   PRN Meds:.acetaminophen  **OR** acetaminophen , HYDROcodone -acetaminophen , levalbuterol , ondansetron  **OR** ondansetron   (ZOFRAN ) IV, mouth rinse, senna-docusate, traZODone   Current Outpatient Medications  Medication Instructions   albuterol  (PROVENTIL ) 2.5 mg, Nebulization, Daily PRN   albuterol  (VENTOLIN  HFA) 108 (90 Base) MCG/ACT inhaler 2 puffs, Inhalation, Every 6 hours PRN   alendronate  (FOSAMAX ) 70 MG tablet TAKE 1 TABLET BY MOUTH EVERY 7  DAYS WITH FULL GLASS OF WATER  ON AN EMPTY STOMACH   amiodarone  (PACERONE ) 200 mg, Oral, Daily   aspirin  81 mg, Oral, Daily   Calcium  Carbonate-Vit D-Min (CALCIUM  600+D PLUS MINERALS) 600-400 MG-UNIT TABS 1 tab p.o. twice daily   EPINEPHrine  (EPI-PEN) 0.3 mg, Intramuscular, As needed   fexofenadine  (ALLEGRA ) 180 mg, Oral, Daily   fluticasone -salmeterol (WIXELA INHUB) 250-50 MCG/ACT AEPB 1 puff, Inhalation, 2 times daily   levothyroxine  (SYNTHROID ) 112 mcg, Oral, Daily   metFORMIN  (GLUCOPHAGE -XR) 500 mg, Oral, Daily, Per 08/09/24 discharge states 1 tablet twice daily - patient states PCP has her on this once daily and they plan to discuss once or twice daily at 08/14/24 appt   Multiple Vitamins-Minerals (MULTIVITAMIN WOMEN 50+) TABS 1 tablet, Daily   pantoprazole  (PROTONIX ) 40 mg, Oral, Daily before breakfast   pravastatin  (PRAVACHOL ) 40 mg, Oral, Daily at bedtime   quiNIDine sulfate  200 mg, Oral, 3 times daily   SPIRIVA  RESPIMAT 2.5 MCG/ACT AERS 2 puffs, Inhalation, Daily   spironolactone  (ALDACTONE ) 12.5 mg, Oral, Daily   torsemide  (DEMADEX ) 20 mg, Oral, Daily, Take 2 tablets daily in case of weight gain 2 to 3 lbs in 24 hrs or 5 lbs in 7 days   valACYclovir  (VALTREX ) 1,000 mg, Oral, 2 times daily    Diet Orders (From admission, onward)     Start     Ordered   08/20/24 1220  Diet Heart Room service appropriate? Yes with Assist; Fluid consistency: Thin  Diet effective now       Question Answer Comment  Room service appropriate? Yes with Assist   Fluid consistency: Thin      08/20/24 1219            DVT prophylaxis: heparin  injection 5,000 Units Start:  08/16/24 1400 SCDs Start: 08/16/24 1020   Lab Results  Component Value Date   PLT 228 08/24/2024      Code Status: Limited: Do not attempt resuscitation (DNR) -DNR-LIMITED -Do Not Intubate/DNI   Family Communication: No family at bedside  Status is: Inpatient Remains inpatient appropriate because: Severity of illness   Level of care: Telemetry  Consultants:  ID Cardiology  Objective: Vitals:   08/24/24 0300 08/24/24 0812 08/24/24 0840 08/24/24 1155  BP: 114/72 (!) 105/59  109/73  Pulse: 89 88  88  Resp: (!) 22 (!) 21  (!) 21  Temp: (!) 97.5 F (36.4 C) (!) 97.4 F (36.3 C)    TempSrc: Oral Oral    SpO2: 90% 90% 96% 100%  Weight:      Height:        Intake/Output Summary (Last 24 hours) at 08/24/2024 1206 Last data filed at 08/24/2024 0816 Gross per 24 hour  Intake 180.37 ml  Output --  Net 180.37 ml   Wt Readings from Last 3 Encounters:  08/22/24 80 kg  08/14/24 81.2 kg  08/09/24 79.4 kg    Examination:  Constitutional: NAD Eyes: lids and conjunctivae normal, no scleral icterus ENMT: mmm Neck: normal, supple Respiratory: clear to auscultation bilaterally, no wheezing, no crackles. Normal respiratory effort.  Cardiovascular: Regular rate and rhythm, no murmurs / rubs / gallops. No LE edema. Abdomen: soft, no distention, no tenderness. Bowel sounds positive.   Data Reviewed: I have independently reviewed following labs and imaging studies   CBC Recent Labs  Lab 08/19/24 1115 08/20/24 0340 08/23/24 0452 08/23/24 0852 08/24/24 0522  WBC 13.2* 10.8* 10.2 10.6* 11.9*  HGB 12.7 12.6 13.7 13.7 12.8  HCT 39.5 37.8 41.4 41.3 38.8  PLT 157 176 224 252 228  MCV 93.8 92.9 94.7 92.8 92.6  MCH 30.2 31.0 31.4 30.8 30.5  MCHC 32.2 33.3 33.1 33.2 33.0  RDW 14.5 14.5 14.7 14.7 14.9  LYMPHSABS  --   --   --  1.3  --   MONOABS  --   --   --  0.5  --   EOSABS  --   --   --  0.1  --   BASOSABS  --   --   --  0.1  --     Recent Labs  Lab 08/17/24 1506  08/17/24 1745 08/18/24 0514 08/18/24 0557 08/19/24 1115 08/20/24 0340 08/21/24 0400 08/22/24 0516 08/23/24 0852 08/24/24 0522  NA 133* 132*  --    < > 129* 134* 134* 133* 132* 133*  K 3.6 3.6  --    < > 4.5 3.7 4.1 4.3 4.6 4.8  CL 95* 94*  --    < > 98 99 101 100 97* 99  CO2 25 26  --    < > 22 28 24 25 27 26   GLUCOSE 124* 115*  --    < > 110* 112* 108* 112* 120* 117*  BUN 19 18  --    < > 17 19 13 9 9 12   CREATININE 0.93 0.83  --    < > 0.59 0.68 0.58 0.65 0.70 0.71  CALCIUM  9.1 8.8*  --    < > 9.4 9.1 9.5 9.5 9.8 9.9  AST 87*  --   --    < > 133* 144* 114* 112*  --  83*  ALT 57*  --   --    < > 82* 88* 77* 73*  --  55*  ALKPHOS 201*  --   --    < > 204* 208* 206* 234*  --  205*  BILITOT 1.1  --   --    < > 1.1 0.7 0.6 0.6  --  0.7  ALBUMIN 3.3*  --   --    < > 2.9* 2.8* 2.8* 2.7*  --  2.9*  MG 1.9 2.8*  --   --  2.2 2.1 2.0  --   --  1.9  LATICACIDVEN 2.3* 1.2 0.7  --   --   --   --   --   --   --   INR  --  1.1  --   --   --   --   --   --   --   --   AMMONIA 22  --   --   --   --   --   --   --   --   --    < > = values in this interval not displayed.    ------------------------------------------------------------------------------------------------------------------  No results for input(s): CHOL, HDL, LDLCALC, TRIG, CHOLHDL, LDLDIRECT in the last 72 hours.  Lab Results  Component Value Date   HGBA1C 5.9 (H) 08/17/2024   ------------------------------------------------------------------------------------------------------------------ No results for input(s): TSH, T4TOTAL, T3FREE, THYROIDAB in the last 72 hours.  Invalid input(s): FREET3  Cardiac Enzymes No results for input(s): CKMB, TROPONINI, MYOGLOBIN in the last 168 hours.  Invalid input(s): CK ------------------------------------------------------------------------------------------------------------------    Component Value Date/Time   BNP 1,744.7 (H) 08/02/2024 1942   BNP 193  (H) 08/10/2022 1158    CBG: Recent Labs  Lab 08/23/24 1205 08/23/24 1544 08/23/24 2206 08/24/24 0614 08/24/24 1151  GLUCAP 122* 133* 173* 132* 120*    Recent Results (from the past 240 hours)  Resp panel by RT-PCR (RSV, Flu A&B, Covid) Anterior Nasal Swab     Status: None   Collection Time: 08/16/24  8:19 AM   Specimen: Anterior Nasal Swab  Result Value Ref Range Status   SARS Coronavirus 2 by RT PCR NEGATIVE NEGATIVE Final   Influenza A by PCR NEGATIVE NEGATIVE Final   Influenza B by PCR NEGATIVE NEGATIVE Final    Comment: (NOTE) The Xpert Xpress SARS-CoV-2/FLU/RSV plus assay is intended as an aid in the diagnosis of influenza from Nasopharyngeal swab specimens and should not be used as a sole basis for treatment. Nasal washings and aspirates are unacceptable for Xpert Xpress SARS-CoV-2/FLU/RSV testing.  Fact Sheet for Patients: bloggercourse.com  Fact Sheet for Healthcare Providers: seriousbroker.it  This test is not yet approved or cleared by the [place]  FDA and has been authorized for detection and/or diagnosis of SARS-CoV-2 by FDA under an Emergency Use Authorization (EUA). This EUA will remain in effect (meaning this test can be used) for the duration of the COVID-19 declaration under Section 564(b)(1) of the Act, 21 U.S.C. section 360bbb-3(b)(1), unless the authorization is terminated or revoked.     Resp Syncytial Virus by PCR NEGATIVE NEGATIVE Final    Comment: (NOTE) Fact Sheet for Patients: bloggercourse.com  Fact Sheet for Healthcare Providers: seriousbroker.it  This test is not yet approved or cleared by the [place]  FDA and has been authorized for detection and/or diagnosis of SARS-CoV-2 by FDA under an Emergency Use Authorization (EUA). This EUA will remain in effect (meaning this test can be used) for the duration of the COVID-19  declaration under Section 564(b)(1) of the Act, 21 U.S.C. section 360bbb-3(b)(1), unless the authorization is terminated or revoked.  Performed at [facility], [address], [place], [place] [address]   MRSA Next Gen by PCR, Nasal     Status: Abnormal   Collection Time: 08/17/24  4:51 PM   Specimen: Nasal Mucosa; Nasal Swab  Result Value Ref Range Status   MRSA by PCR Next Gen DETECTED (A) NOT DETECTED Final    Comment: RESULT CALLED TO, READ BACK BY AND VERIFIED WITH: L BROWN RN 08/17/2024 @ 2142 BY AB (NOTE) The GeneXpert MRSA Assay (FDA approved for NASAL specimens only), is one component of a comprehensive MRSA colonization surveillance program. It is not intended to diagnose MRSA infection nor to guide or monitor treatment for MRSA infections. Test performance is not FDA approved in patients less than 20 years old. Performed at [facility], [address], [place], [place] [address]   Culture, blood (Routine X 2) w Reflex to ID Panel     Status: Abnormal   Collection Time: 08/17/24  5:58 PM   Specimen: BLOOD LEFT HAND  Result Value Ref Range  Status   Specimen Description BLOOD LEFT HAND  Final   Special Requests   Final    BOTTLES DRAWN AEROBIC AND ANAEROBIC Blood Culture adequate volume   Culture  Setup Time   Final    GRAM POSITIVE COCCI IN BOTH AEROBIC AND ANAEROBIC BOTTLES CRITICAL RESULT CALLED TO, READ BACK BY AND VERIFIED WITH: PHARMD J.FRENS AT 0802 ON 08/18/2024 BY T.SAAD.    Culture (A)  Final    METHICILLIN RESISTANT STAPHYLOCOCCUS AUREUS Sent to [facility] for further susceptibility testing. Performed at [facility], [address], [place], [place] [address]    Report Status 08/21/2024 FINAL  Final   Organism ID, Bacteria METHICILLIN RESISTANT STAPHYLOCOCCUS AUREUS  Final      Susceptibility   Methicillin resistant staphylococcus aureus - MIC*    CIPROFLOXACIN  >=8 RESISTANT Resistant     ERYTHROMYCIN  >=8 RESISTANT Resistant      GENTAMICIN  <=0.5 SENSITIVE Sensitive     OXACILLIN >=4 RESISTANT Resistant     TETRACYCLINE <=1 SENSITIVE Sensitive     VANCOMYCIN  1 SENSITIVE Sensitive     TRIMETH/SULFA <=10 SENSITIVE Sensitive     CLINDAMYCIN  <=0.25 SENSITIVE Sensitive     RIFAMPIN <=0.5 SENSITIVE Sensitive     Inducible Clindamycin  NEGATIVE Sensitive     LINEZOLID 2 SENSITIVE Sensitive     * METHICILLIN RESISTANT STAPHYLOCOCCUS AUREUS  Blood Culture ID Panel (Reflexed)     Status: Abnormal   Collection Time: 08/17/24  5:58 PM  Result Value Ref Range Status   Enterococcus faecalis NOT DETECTED NOT DETECTED Final   Enterococcus Faecium NOT DETECTED NOT DETECTED Final   Listeria monocytogenes NOT DETECTED NOT DETECTED Final   Staphylococcus species DETECTED (A) NOT DETECTED Final    Comment: CRITICAL RESULT CALLED TO, READ BACK BY AND VERIFIED WITH: PHARMD J.FRENS AT 0802 ON 08/18/2024 BY T.SAAD.    Staphylococcus aureus (BCID) DETECTED (A) NOT DETECTED Final    Comment: Methicillin (oxacillin)-resistant Staphylococcus aureus (MRSA). MRSA is predictably resistant to beta-lactam antibiotics (except ceftaroline). Preferred therapy is vancomycin  unless clinically contraindicated. Patient requires contact precautions if  hospitalized. CRITICAL RESULT CALLED TO, READ BACK BY AND VERIFIED WITH: PHARMD J.FRENS AT 0802 ON 08/18/2024 BY T.SAAD.    Staphylococcus epidermidis NOT DETECTED NOT DETECTED Final   Staphylococcus lugdunensis NOT DETECTED NOT DETECTED Final   Streptococcus species NOT DETECTED NOT DETECTED Final   Streptococcus agalactiae NOT DETECTED NOT DETECTED Final   Streptococcus pneumoniae NOT DETECTED NOT DETECTED Final   Streptococcus pyogenes NOT DETECTED NOT DETECTED Final   A.calcoaceticus-baumannii NOT DETECTED NOT DETECTED Final   Bacteroides fragilis NOT DETECTED NOT DETECTED Final   Enterobacterales NOT DETECTED NOT DETECTED Final   Enterobacter cloacae complex NOT DETECTED NOT DETECTED Final    Escherichia coli NOT DETECTED NOT DETECTED Final   Klebsiella aerogenes NOT DETECTED NOT DETECTED Final   Klebsiella oxytoca NOT DETECTED NOT DETECTED Final   Klebsiella pneumoniae NOT DETECTED NOT DETECTED Final   Proteus species NOT DETECTED NOT DETECTED Final   Salmonella species NOT DETECTED NOT DETECTED Final   Serratia marcescens NOT DETECTED NOT DETECTED Final   Haemophilus influenzae NOT DETECTED NOT DETECTED Final   Neisseria meningitidis NOT DETECTED NOT DETECTED Final   Pseudomonas aeruginosa NOT DETECTED NOT DETECTED Final   Stenotrophomonas maltophilia NOT DETECTED NOT DETECTED Final   Candida albicans NOT DETECTED NOT DETECTED Final   Candida auris NOT DETECTED NOT DETECTED Final   Candida glabrata NOT DETECTED NOT DETECTED Final   Candida krusei NOT  DETECTED NOT DETECTED Final   Candida parapsilosis NOT DETECTED NOT DETECTED Final   Candida tropicalis NOT DETECTED NOT DETECTED Final   Cryptococcus neoformans/gattii NOT DETECTED NOT DETECTED Final   Meth resistant mecA/C and MREJ DETECTED (A) NOT DETECTED Final    Comment: CRITICAL RESULT CALLED TO, READ BACK BY AND VERIFIED WITH: PHARMD J.FRENS AT 0802 ON 08/18/2024 BY T.SAAD. Performed at [facility], [address] [place], [place] [address]   MIC (1 Drug)-     Status: Abnormal   Collection Time: 08/17/24  5:58 PM  Result Value Ref Range Status   Min Inhibitory Conc (1 Drug) Preliminary report (A)  Final    Comment: (NOTE) Performed At: [facility] [address] [place], [place] [id number] Jennette Shorter MD Ey:[id number]    Source LAB [phone] MRSA DAPTO BLOOS  Corrected    Comment: Performed at [facility], [address] [place], [place] [address] CORRECTED ON 12/30 AT 0800: PREVIOUSLY REPORTED AS BLOOD   MIC Result     Status: Abnormal   Collection Time: 08/17/24  5:58 PM  Result Value Ref Range Status   Result 1 (MIC) Comment (A)  Final    Comment: (NOTE) Methicillin - resistant  Staphylococcus aureus Identification performed by account, not confirmed by this laboratory. DAPTOMYCIN  Performed At: [facility] [address] [place], [place] [id number] Jennette Shorter MD Ey:[id number]   Culture, blood (Routine X 2) w Reflex to ID Panel     Status: Abnormal   Collection Time: 08/17/24  6:01 PM   Specimen: BLOOD RIGHT HAND  Result Value Ref Range Status   Specimen Description BLOOD RIGHT HAND  Final   Special Requests   Final    BOTTLES DRAWN AEROBIC AND ANAEROBIC Blood Culture results may not be optimal due to an inadequate volume of blood received in culture bottles   Culture  Setup Time   Final    GRAM POSITIVE COCCI IN BOTH AEROBIC AND ANAEROBIC BOTTLES CRITICAL VALUE NOTED.  VALUE IS CONSISTENT WITH PREVIOUSLY REPORTED AND CALLED VALUE.    Culture (A)  Final    STAPHYLOCOCCUS AUREUS SUSCEPTIBILITIES PERFORMED ON PREVIOUS CULTURE WITHIN THE LAST 5 DAYS. Performed at [facility], [address], [place], [place] [address]    Report Status 08/21/2024 FINAL  Final  Culture, blood (Routine X 2) w Reflex to ID Panel     Status: Abnormal   Collection Time: 08/19/24 11:15 AM   Specimen: BLOOD  Result Value Ref Range Status   Specimen Description BLOOD BLOOD RIGHT ARM  Final   Special Requests   Final    BOTTLES DRAWN AEROBIC AND ANAEROBIC Blood Culture results may not be optimal due to an inadequate volume of blood received in culture bottles   Culture  Setup Time   Final    GRAM POSITIVE COCCI IN CLUSTERS IN BOTH AEROBIC AND ANAEROBIC BOTTLES Gram Stain Report Called to,Read Back By and Verified With: PHARMD G ABBOTT 08/20/2024 @ 0552 BY AB CRITICAL VALUE NOTED.  VALUE IS CONSISTENT WITH PREVIOUSLY REPORTED AND CALLED VALUE.    Culture (A)  Final    STAPHYLOCOCCUS AUREUS SUSCEPTIBILITIES PERFORMED ON PREVIOUS CULTURE WITHIN THE LAST 5 DAYS. Performed at [facility], [address] [place], [place] [address]    Report Status  08/22/2024 FINAL  Final  Culture, blood (Routine X 2) w Reflex to ID Panel     Status: Abnormal   Collection Time: 08/19/24 11:15 AM  Specimen: BLOOD LEFT ARM  Result Value Ref Range Status   Specimen Description BLOOD LEFT ARM  Final   Special Requests   Final    BOTTLES DRAWN AEROBIC ONLY Blood Culture results may not be optimal due to an inadequate volume of blood received in culture bottles   Culture  Setup Time   Final    GRAM POSITIVE COCCI IN CLUSTERS AEROBIC BOTTLE ONLY CRITICAL VALUE NOTED.  VALUE IS CONSISTENT WITH PREVIOUSLY REPORTED AND CALLED VALUE.    Culture (A)  Final    STAPHYLOCOCCUS AUREUS SUSCEPTIBILITIES PERFORMED ON PREVIOUS CULTURE WITHIN THE LAST 5 DAYS. Performed at [facility], [address], [place], [place] [address]    Report Status 08/22/2024 FINAL  Final  Culture, blood (Routine X 2) w Reflex to ID Panel     Status: None (Preliminary result)   Collection Time: 08/21/24 10:46 AM   Specimen: BLOOD  Result Value Ref Range Status   Specimen Description BLOOD BLOOD RIGHT ARM  Final   Special Requests   Final    BOTTLES DRAWN AEROBIC AND ANAEROBIC Blood Culture adequate volume   Culture  Setup Time   Final    GRAM POSITIVE COCCI ANAEROBIC BOTTLE ONLY CRITICAL VALUE NOTED.  VALUE IS CONSISTENT WITH PREVIOUSLY REPORTED AND CALLED VALUE. Performed at [facility], [address], [place], [place] [address]    Culture GRAM POSITIVE COCCI  Final   Report Status PENDING  Incomplete  Culture, blood (Routine X 2) w Reflex to ID Panel     Status: None (Preliminary result)   Collection Time: 08/21/24 10:46 AM   Specimen: BLOOD  Result Value Ref Range Status   Specimen Description BLOOD BLOOD LEFT HAND  Final   Special Requests   Final    BOTTLES DRAWN AEROBIC AND ANAEROBIC Blood Culture results may not be optimal due to an inadequate volume of blood received in culture bottles   Culture   Final    NO GROWTH 3 DAYS Performed at [facility], [address] [place], [place] [address]    Report Status PENDING  Incomplete     Radiology Studies: US  EKG SITE RITE Result Date: 08/23/2024 If Site Rite image not attached, placement could not be confirmed due to current cardiac rhythm.    Nilda Fendt, MD, PhD [facility]  Between 7 am - 7 pm I am available, please contact me via Amion (for emergencies) or Securechat (non urgent messages)  Between 7 pm - 7 am I am not available, please contact night coverage MD/APP via Amion  "

## 2024-08-24 NOTE — Progress Notes (Signed)
"   This nurse in room to give evening medications on removal of CPAP mask from sleeping patient c/o numbness all over and had trouble smiling. This nurse called code stroke and notified provider of results of the code stroke. Patient has new orders for Q2 hour NIHSS neuro checks and repeat CT in 24 hours from initial CTA  "

## 2024-08-24 NOTE — Consult Note (Signed)
 NEUROLOGY CONSULT NOTE   Date of service: August 24, 2024 Patient Name: Tanya Hogan MRN:  [id number] DOB:  07-29-1951 Chief Complaint: Code Stroke Requesting Provider: Trixie Nilda HERO, MD  History of Present Illness  Tanya Hogan is a 74 y.o. female with hx of chronic systolic CHF, HTN, PVD, DM2, presence of pacemaker, hypothyroidism comes into the hospital with shortness of breath and was found to have MRSA bacteremia and acute on chronic CHF. Code stroke activated on 1/1 for numbness, dysarthria, and facial droop. She has significant pain with passive range of motion in her bilateral lower extremities. She told her bedside RN that she has a sensation of numbness in her extremities. She denies headache, nausea, vomiting, or vision changes. No difficulty finding her words.   LKW: 1300 Modified rankin score: 4-Needs assistance to walk and tend to bodily needs IV Thrombolysis: No, too mild to treat, patient just received prophylactic dose of heparin  within past hour EVT: No, no LVO   NIHSS components Score: Comment  1a Level of Conscious 0[]  1[]  2[]  3[]      1b LOC Questions 0[]  1[x]  2[]       1c LOC Commands 0[]  1[]  2[]       2 Best Gaze 0[]  1[]  2[]       3 Visual 0[]  1[]  2[]  3[]      4 Facial Palsy 0[]  1[x]  2[]  3[]      5a Motor Arm - left 0[]  1[x]  2[]  3[]  4[]  UN[]    5b Motor Arm - Right 0[]  1[x]  2[]  3[]  4[]  UN[]    6a Motor Leg - Left 0[]  1[x]  2[]  3[]  4[]  UN[]    6b Motor Leg - Right 0[]  1[x]  2[]  3[]  4[]  UN[]    7 Limb Ataxia 0[]  1[]  2[]  UN[]      8 Sensory 0[]  1[]  2[]  UN[]      9 Best Language 0[]  1[]  2[]  3[]      10 Dysarthria 0[]  1[x]  2[]  UN[]      11 Extinct. and Inattention 0[]  1[]  2[]       TOTAL:7       ROS  Comprehensive ROS performed and pertinent positives documented in HPI   Past History   Past Medical History:  Diagnosis Date   Alkaline phosphatase elevation    Asthma    Breast cancer (HCC) 2011   Carpal tunnel syndrome    bilateral   Chronic HFrEF (heart  failure with reduced ejection fraction) (HCC)    Cirrhosis, non-alcoholic (HCC)    Closed fracture of unspecified part of radius (alone)    DDD (degenerative disc disease), lumbar    Depression    pt denies   Diabetes mellitus without complication (HCC)    Enlarged heart    Fibromyalgia    GERD (gastroesophageal reflux disease)    History of kidney stones    HTN (hypertension)    Hypothyroidism    IBS (irritable bowel syndrome)    LBBB (left bundle branch block)    Microcytic anemia 05/14/2017   Mitral regurgitation    NICM (nonischemic cardiomyopathy) (HCC)    Pneumonia    Presence of permanent cardiac pacemaker    Pulmonary hypertension (HCC)    PVD (peripheral vascular disease)    2019   Sleep apnea    uses cpap   Tricuspid regurgitation    Ventricular tachyarrhythmia [facility])     Past Surgical History:  Procedure Laterality Date   BIV ICD GENERATOR CHANGEOUT N/A 05/31/2024   Procedure: BIV ICD GENERATOR CHANGEOUT;  Surgeon: Almetta Donnice LABOR, MD;  Location: [facility] INVASIVE CV LAB;  Service: Cardiovascular;  Laterality: N/A;   BIV PACEMAKER GENERATOR CHANGEOUT N/A 11/03/2021   Procedure: BIV PACEMAKER GENERATOR CHANGEOUT;  Surgeon: Waddell Danelle ORN, MD;  Location: [facility] INVASIVE CV LAB;  Service: Cardiovascular;  Laterality: N/A;   BREAST LUMPECTOMY Left 2011   COLONOSCOPY     FOOT SURGERY     KNEE SURGERY     LEAD INSERTION N/A 05/31/2024   Procedure: LEAD INSERTION;  Surgeon: Almetta Donnice LABOR, MD;  Location: [facility] INVASIVE CV LAB;  Service: Cardiovascular;  Laterality: N/A;   LEAD REVISION/REPAIR N/A 11/03/2021   Procedure: LEAD REVISION/REPAIR;  Surgeon: Waddell Danelle ORN, MD;  Location: [facility] INVASIVE CV LAB;  Service: Cardiovascular;  Laterality: N/A;   MASTECTOMY Right    05/2019   PACEMAKER GENERATOR CHANGE N/A 08/23/2014   Procedure: PACEMAKER GENERATOR CHANGE;  Surgeon: Danelle ORN Waddell, MD;  Location: [facility] CATH LAB;  Service: Cardiovascular;  Laterality: N/A;   PACEMAKER  PLACEMENT  2009   [place] [facility]   RECTAL SURGERY  1976   REDUCTION MAMMAPLASTY Left    05/2019   RIGHT/LEFT HEART CATH AND CORONARY ANGIOGRAPHY N/A 05/18/2024   Procedure: RIGHT/LEFT HEART CATH AND CORONARY ANGIOGRAPHY;  Surgeon: Wendel Lurena POUR, MD;  Location: [facility] INVASIVE CV LAB;  Service: Cardiovascular;  Laterality: N/A;   TOTAL SHOULDER ARTHROPLASTY Right 03/10/2018   TOTAL SHOULDER ARTHROPLASTY Right 03/10/2018   Procedure: RIGHT TOTAL SHOULDER ARTHROPLASTY;  Surgeon: Dozier Soulier, MD;  Location: [facility] OR;  Service: Orthopedics;  Laterality: Right;   TUBAL LIGATION     UPPER EXTREMITY VENOGRAPHY  05/18/2024   Procedure: UPPER EXTREMITY VENOGRAPHY;  Surgeon: Wendel Lurena POUR, MD;  Location: [facility] INVASIVE CV LAB;  Service: Cardiovascular;;    Family History: Family History  Problem Relation Age of Onset   Heart disease Mother    Melanoma Mother    Parkinson's disease Mother    Heart disease Father    Heart disease Brother    Heart failure Other    Breast cancer Other    Hypertension Other     Social History  reports that she quit smoking about 58 years ago. Her smoking use included cigarettes. She has never used smokeless tobacco. She reports that she does not drink alcohol  and does not use drugs.  Allergies[1]  Medications  Current Medications[2]  Vitals   Vitals:   08/24/24 0812 08/24/24 0840 08/24/24 1155 08/24/24 1527  BP: (!) 105/59  109/73 114/83  Pulse: 88  88 89  Resp: (!) 21  (!) 21 (!) 21  Temp: (!) 97.4 F (36.3 C)   97.9 F (36.6 C)  TempSrc: Oral   Axillary  SpO2: 90% 96% 100% 100%  Weight:      Height:        Body mass index is 33.33 kg/m.   Physical Exam   Constitutional: Appears well-developed and well-nourished.  Psych: Affect appropriate to situation.  Eyes: No scleral injection.  HENT: No OP obstruction.  Head: Normocephalic.  Cardiovascular: Normal rate and regular rhythm.  Respiratory: Effort normal, non-labored breathing.   GI: Soft.  No distension. There is no tenderness.  Skin: vasculitis rash on bilateral lower and upper extremities   Neurologic Examination    Neuro: Mental Status: Patient is awake, alert, oriented to person, place, states it is December  Speech is slightly dysarthric  Cranial Nerves: II: Visual Fields are full. Pupils are equal, round, and reactive to light.   III,IV, VI: EOMI without ptosis or diploplia.  V: Facial sensation is symmetric to temperature VII: slight left facial asymmetry VIII: Hearing is intact to voice X: Palate elevates symmetrically XI: Shoulder shrug is symmetric. XII: Tongue protrudes midline without atrophy or fasciculations.  Motor: Tone is normal. Bulk is normal. Drift and generalized weakness in all extremities. Significant pain with movement in her bilateral upper and lower extremities  Sensory: Endorses a sensation of numbness in her extremities.  Cerebellar: Unable to complete   Labs/Imaging/Neurodiagnostic studies   CBC:  Recent Labs  Lab 2024-09-19 0852 08/24/24 0522  WBC 10.6* 11.9*  NEUTROABS 8.4*  --   HGB 13.7 12.8  HCT 41.3 38.8  MCV 92.8 92.6  PLT 252 228   Basic Metabolic Panel:  Lab Results  Component Value Date   NA 133 (L) 08/24/2024   K 4.8 08/24/2024   CO2 26 08/24/2024   GLUCOSE 117 (H) 08/24/2024   BUN 12 08/24/2024   CREATININE 0.71 08/24/2024   CALCIUM  9.9 08/24/2024   GFRNONAA >60 08/24/2024   GFRAA 76 03/26/2020   Lipid Panel:  Lab Results  Component Value Date   LDLCALC 73 03/29/2023   HgbA1c:  Lab Results  Component Value Date   HGBA1C 5.9 (H) 08/17/2024   INR  Lab Results  Component Value Date   INR 1.1 08/17/2024   APTT  Lab Results  Component Value Date   APTT 29 08/17/2024   CT Head without contrast(Personally reviewed): No acute intracranial hemorrhage. ASPECT score 10.  CT angio Head and Neck with contrast(Personally reviewed): Read pending   Neurodiagnostics Echo: EF <20%,  bilateral atria severely dilated   ASSESSMENT   Tanya Hogan is a 74 y.o. female with hx of chronic systolic CHF, HTN, PVD, DM2, presence of pacemaker, hypothyroidism comes into the hospital with shortness of breath and was found to have MRSA bacteremia and acute on chronic CHF. Exam with subjective numbness, facial droop, dysarthria, and generalized weakness with pain in her extremities.  She is currently on ASA 81mg . EF <20%, defer anticoagulation need to cardiology team. She is unable to get an MRI due to her pacemaker per previous notes. HgbA1C is 5.9 and LDL is 73. She has a statin intolerance documented.   RECOMMENDATIONS  - CT angio head and neck  - Continue ASA 81mg   - q 2 neuro checks - Repeat CT Head at 24 hours   Stroke team will f/u tomorrow.  ______________________________________________________________________    Bonney Jorene Last, NP [facility]    Attending Neurohospitalist Addendum Patient seen and examined with APP/Resident. Agree with the history and physical as documented above. Agree with the plan as documented, which I helped formulate. I have edited the note above to reflect my full findings and recommendations. I have independently reviewed the chart, obtained history, review of systems and examined the patient.I have personally reviewed pertinent head/neck/spine imaging (CT/MRI). Please feel free to call with any questions.  -- Elida Ross, MD [facility] [phone]  If 7pm- 7am, please page neurology on call as listed in AMION.       [1]  Allergies Allergen Reactions   Injectafer [Ferric Carboxymaltose] Shortness Of Breath    Mild SOB following injectafer infusion    Penicillins Anaphylaxis and Rash    Tolerated ceftin  2020   Accupril [Quinapril Hcl] Other (See Comments)    Hyperkalemia=high potassium level   Celebrex [Celecoxib] Other (See Comments)    Hx hematemesis, bleeding ulcer   Cozaar  [Losartan   Potassium] Itching   Dml Forte Rash  Entresto [Sacubitril-Valsartan] Other (See Comments)    Hyperkalemia   Flonase  [Fluticasone ] Other (See Comments)    Hx nasal ulcer   Lactose Intolerance (Gi) Diarrhea   Lipitor [Atorvastatin ] Other (See Comments)    Myalgias    Nasonex  [Mometasone  Furoate] Other (See Comments)    Epistaxis    Nsaids Other (See Comments)    Hx hematemesis, bleeding ulcer   Advil [Ibuprofen] Nausea And Vomiting    Hx hematemesis, bleeding ulcer   Baking Soda-Fluoride [Sodium Fluoride] Itching and Rash   Bayer Aspirin  [Aspirin ] Other (See Comments)    Hx hematemesis, bleeding ulcer   Chamomile Hives   Clindamycin /Lincomycin Rash   Codeine Nausea And Vomiting   Lasix  [Furosemide ] Other (See Comments)    Tinnitus  Hypotension   Miralax  [Polyethylene Glycol (Macrogol)] Other (See Comments)    Leaky gut syndrome   Quinine Rash   Reclast  [Zoledronic  Acid] Rash and Other (See Comments)    Tremors    Rifadin [Rifampin] Other (See Comments)    Unknown reaction   Singulair  [Montelukast ] Other (See Comments)    Dizziness    Sulfa Antibiotics Rash   Ultram  [Tramadol ] Anxiety and Rash   Vesicare  [Solifenacin ] Other (See Comments)    Urinary retention   Wellbutrin  [Bupropion ] Other (See Comments)    Myalgias  Skin crawling sensation   Wool Alcohol  [Lanolin] Rash  [2]  Current Facility-Administered Medications:    acetaminophen  (TYLENOL ) tablet 650 mg, 650 mg, Oral, Q6H PRN, 650 mg at 08/22/24 0930 **OR** acetaminophen  (TYLENOL ) suppository 650 mg, 650 mg, Rectal, Q6H PRN, Dunn, [name] N, PA-C   amiodarone  (PACERONE ) tablet 200 mg, 200 mg, Oral, Daily, Dunn, [name] N, PA-C, 200 mg at 08/24/24 0920   arformoterol  (BROVANA ) nebulizer solution 15 mcg, 15 mcg, Nebulization, BID, Arrien, Mauricio Daniel, MD, 15 mcg at 08/24/24 0840   aspirin  chewable tablet 81 mg, 81 mg, Oral, Daily, Dunn, [name] N, PA-C, 81 mg at 08/24/24 0920   budesonide  (PULMICORT ) nebulizer solution  0.25 mg, 0.25 mg, Nebulization, BID, Arrien, Mauricio Daniel, MD, 0.25 mg at 08/24/24 0841   Chlorhexidine  Gluconate Cloth 2 % PADS 6 each, 6 each, Topical, Daily, Chand, Sudham, MD, 6 each at 08/23/24 0949   DAPTOmycin  (CUBICIN ) IVPB 500 mg/50mL premix, 8 mg/kg (Adjusted), Intravenous, Q1400, Trixie Nilda HERO, MD, Last Rate: 100 mL/hr at 08/23/24 1534, Infusion Verify at 08/23/24 1534   heparin  injection 5,000 Units, 5,000 Units, Subcutaneous, Q8H, Dunn, [name] N, PA-C, 5,000 Units at 08/24/24 1517   HYDROcodone -acetaminophen  (NORCO/VICODIN) 5-325 MG per tablet 1 tablet, 1 tablet, Oral, Q6H PRN, Gherghe, Costin M, MD, 1 tablet at 08/23/24 2048   insulin  aspart (novoLOG ) injection 0-5 Units, 0-5 Units, Subcutaneous, QHS, Dunn, [name] N, PA-C   insulin  aspart (novoLOG ) injection 0-9 Units, 0-9 Units, Subcutaneous, TID WC, Dunn, [name] N, PA-C, 1 Units at 08/24/24 0829   lactobacillus (FLORANEX/LACTINEX) granules 1 g, 1 g, Oral, TID WC, Dunn, [name] N, PA-C, 1 g at 08/24/24 0919   levalbuterol  (XOPENEX ) nebulizer solution 1.25 mg, 1.25 mg, Nebulization, Q6H PRN, Dunn, [name] N, PA-C   levothyroxine  (SYNTHROID ) tablet 112 mcg, 112 mcg, Oral, Daily, Dunn, [name] N, PA-C, 112 mcg at 08/24/24 0603   multivitamin with minerals tablet 1 tablet, 1 tablet, Oral, Daily, Dunn, [name] N, PA-C, 1 tablet at 08/24/24 0920   ondansetron  (ZOFRAN ) tablet 4 mg, 4 mg, Oral, Q6H PRN **OR** ondansetron  (ZOFRAN ) injection 4 mg, 4 mg, Intravenous, Q6H PRN, Dunn, [name] N, PA-C   Oral care mouth rinse,  15 mL, Mouth Rinse, 4 times per day, Harold Scholz, MD, 15 mL at 08/24/24 0753   Oral care mouth rinse, 15 mL, Mouth Rinse, PRN, Chand, Sudham, MD   pantoprazole  (PROTONIX ) EC tablet 40 mg, 40 mg, Oral, QAC breakfast, Dunn, [name] N, PA-C, 40 mg at 08/24/24 0920   pravastatin  (PRAVACHOL ) tablet 40 mg, 40 mg, Oral, QHS, Chand, Sudham, MD, 40 mg at 08/23/24 2047   quiNIDine sulfate  tablet 200 mg, 200 mg, Oral, TID, Harold Scholz, MD, 200  mg at 08/24/24 1517   revefenacin  (YUPELRI ) nebulizer solution 175 mcg, 175 mcg, Nebulization, Daily, Arrien, Mauricio Daniel, MD, 175 mcg at 08/24/24 0840   senna-docusate (Senokot-S) tablet 1 tablet, 1 tablet, Oral, QHS PRN, Dunn, [name] N, PA-C   sodium chloride  flush (NS) 0.9 % injection 3 mL, 3 mL, Intravenous, Q12H, Dunn, [name] N, PA-C, 3 mL at 08/24/24 0829   sodium chloride  flush (NS) 0.9 % injection 3 mL, 3 mL, Intravenous, Q12H, Dunn, [name] N, PA-C, 3 mL at 08/24/24 9170   spironolactone  (ALDACTONE ) tablet 12.5 mg, 12.5 mg, Oral, Daily, Arrien, Mauricio Daniel, MD, 12.5 mg at 08/24/24 0920   traZODone  (DESYREL ) tablet 25 mg, 25 mg, Oral, QHS PRN, Dunn, [name] N, PA-C, 25 mg at 08/23/24 2047

## 2024-08-24 NOTE — Progress Notes (Signed)
 "                                                                                                                                                                                                       Daily Progress Note   Patient Name: Tanya Hogan       Date: 08/24/2024 DOB: March 02, 1951  Age: 74 y.o. MRN#: [id number] Attending Physician: Tanya Nilda HERO, MD Primary Care Physician: Tanya Dorothyann BIRCH, MD Admit Date: 08/16/2024  Reason for Consultation/Follow-up: Establishing goals of care  Subjective: Medical records reviewed including progress notes, labs, imaging. Patient assessed at the bedside.  She reports doing ok, notes vaginal dryness and irritation that is worse with movement/friction. Discussed with RN.   Created space and opportunity for patient's thoughts and feelings on her current illness. She feels strongly about going home despite concerns from her daughter and the medical team. She shares that she most looks forward to her goal of spending more time with her great grandbaby who is 36 months old. Reviewed importance of ongoing GOC discussions as she navigates prolonged treatment with high risk for additional symptoms/suffering such as her rash and her inability to eat. Encouraged her to continue reflecting on her priorities and sharing her preference for her care. She was initially surprised to hear mention of comfort-focused care and hospice philosophy, though she was reassured to hear that her prognosis has not changed; rather, this is always an option if her quality of life is no longer acceptable in the future.  Reviewed her care plan with her in detail, referencing latest notes from cardiology, hospitalist, and ID. She doesn't know what is best and doesn't want to make any decisions while on pain medication. Emotional support and therapeutic listening was provided. Participated in prayer at her request. We then discussed discharge planning, referencing TOC note from yesterday.  She is agreeable to continued visits from PMT for palliative support and GOC discussions. She declines a call to her daughter today.  Questions and concerns addressed. PMT will continue to support holistically.   Length of Stay: 8   Physical Exam Vitals and nursing note reviewed.  Constitutional:      General: She is not in acute distress.    Appearance: She is ill-appearing.  HENT:     Head: Normocephalic and atraumatic.  Cardiovascular:     Rate and Rhythm: Normal rate.  Pulmonary:     Effort: Pulmonary effort is normal.  Musculoskeletal:     Left hand: Swelling present.  Skin:    General: Skin is dry.  Findings: Petechiae present.     Comments: Diffuse rash over bilateral feet  Neurological:     Mental Status: She is alert. Mental status is at baseline.  Psychiatric:        Mood and Affect: Affect is labile.        Behavior: Behavior normal.        Cognition and Memory: Cognition normal.            Vital Signs: BP (!) 105/59 (BP Location: Left Wrist)   Pulse 88   Temp (!) 97.4 F (36.3 C) (Oral)   Resp (!) 21   Ht 5' 1 (1.549 m)   Wt 80 kg   SpO2 90%   BMI 33.33 kg/m  SpO2: SpO2: 90 % O2 Device: O2 Device: Room Air O2 Flow Rate: O2 Flow Rate (L/min): 2 L/min  Palliative Care Assessment & Plan   Patient Profile: 74 y.o. female  with past medical history of recent revision of pacemaker/AICD, breast cancer, end stage chronic heart failure, nonalcoholic cirrhosis, BiV pacemake, depression, DM2, GERD, HTN, hypothyroidism, valvular heart disease, pulmonary hypertension, sleep apnea and ventricular tachy arrhythmia  admitted on 08/16/2024 with swelling and difficulty breathing.    Admitted for acute on chronic HF. On 12/25 she became minimally responsive requiring BiPAP. She was found to have MRSA bacteremia.   She has six inpatient hospitalizations in the last six months.   Recommendations/Plan: Continue DNR/DNI Continue current care plan Patient confirms  goal is to continue with limited medical interventions and avoid invasive procedures. Hopeful for more time with her great-grandchild and understandably emotional about her situation Ongoing GOC discussions and continued PMT support    Care plan was discussed with patient, bedside RN   Mickle Fell, PA-C Palliative Medicine Team Team phone # [phone]  Thank you for allowing the Palliative Medicine Team to assist in the care of this patient. Please utilize secure chat with additional questions, if there is no response within 30 minutes please call the above phone number.  Palliative Medicine Team providers are available by phone from 7am to 7pm daily and can be reached through the team cell phone.  Should this patient require assistance outside of these hours, please call the patient's attending physician.   Time Total: 40  Visit consisted of counseling and education dealing with the complex and emotionally intense issues of symptom management and palliative care in the setting of serious and potentially life-threatening illness. Greater than 50% of this time was spent counseling and coordinating care related to the above assessment and plan.  Personally spent 40 minutes in patient care including extensive chart review (labs, imaging, progress/consult notes, vital signs), medically appropraite exam, discussed with treatment team, education to patient, family, and staff, documenting clinical information, medication review and management, coordination of care, and available advanced directive documents.    "

## 2024-08-25 ENCOUNTER — Inpatient Hospital Stay (HOSPITAL_COMMUNITY)

## 2024-08-25 ENCOUNTER — Other Ambulatory Visit: Payer: Self-pay | Admitting: Cardiology

## 2024-08-25 DIAGNOSIS — I6389 Other cerebral infarction: Secondary | ICD-10-CM

## 2024-08-25 DIAGNOSIS — E785 Hyperlipidemia, unspecified: Secondary | ICD-10-CM | POA: Diagnosis not present

## 2024-08-25 DIAGNOSIS — Z7984 Long term (current) use of oral hypoglycemic drugs: Secondary | ICD-10-CM

## 2024-08-25 DIAGNOSIS — Z7982 Long term (current) use of aspirin: Secondary | ICD-10-CM | POA: Diagnosis not present

## 2024-08-25 DIAGNOSIS — M7989 Other specified soft tissue disorders: Secondary | ICD-10-CM

## 2024-08-25 DIAGNOSIS — R21 Rash and other nonspecific skin eruption: Secondary | ICD-10-CM | POA: Diagnosis not present

## 2024-08-25 DIAGNOSIS — Z950 Presence of cardiac pacemaker: Secondary | ICD-10-CM | POA: Diagnosis not present

## 2024-08-25 DIAGNOSIS — E1151 Type 2 diabetes mellitus with diabetic peripheral angiopathy without gangrene: Secondary | ICD-10-CM | POA: Diagnosis not present

## 2024-08-25 DIAGNOSIS — I110 Hypertensive heart disease with heart failure: Secondary | ICD-10-CM | POA: Diagnosis not present

## 2024-08-25 DIAGNOSIS — Z515 Encounter for palliative care: Secondary | ICD-10-CM | POA: Diagnosis not present

## 2024-08-25 DIAGNOSIS — B370 Candidal stomatitis: Secondary | ICD-10-CM | POA: Diagnosis not present

## 2024-08-25 DIAGNOSIS — Z87891 Personal history of nicotine dependence: Secondary | ICD-10-CM | POA: Diagnosis not present

## 2024-08-25 DIAGNOSIS — I5021 Acute systolic (congestive) heart failure: Secondary | ICD-10-CM | POA: Diagnosis not present

## 2024-08-25 DIAGNOSIS — G9340 Encephalopathy, unspecified: Secondary | ICD-10-CM

## 2024-08-25 DIAGNOSIS — B9562 Methicillin resistant Staphylococcus aureus infection as the cause of diseases classified elsewhere: Secondary | ICD-10-CM | POA: Diagnosis not present

## 2024-08-25 DIAGNOSIS — R7881 Bacteremia: Secondary | ICD-10-CM | POA: Diagnosis not present

## 2024-08-25 DIAGNOSIS — Z7189 Other specified counseling: Secondary | ICD-10-CM

## 2024-08-25 DIAGNOSIS — I509 Heart failure, unspecified: Secondary | ICD-10-CM | POA: Diagnosis not present

## 2024-08-25 DIAGNOSIS — R29705 NIHSS score 5: Secondary | ICD-10-CM

## 2024-08-25 DIAGNOSIS — I5023 Acute on chronic systolic (congestive) heart failure: Secondary | ICD-10-CM | POA: Diagnosis not present

## 2024-08-25 LAB — GLUCOSE, CAPILLARY
Glucose-Capillary: 129 mg/dL — ABNORMAL HIGH (ref 70–99)
Glucose-Capillary: 157 mg/dL — ABNORMAL HIGH (ref 70–99)
Glucose-Capillary: 113 mg/dL — ABNORMAL HIGH (ref 70–99)
Glucose-Capillary: 120 mg/dL — ABNORMAL HIGH (ref 70–99)

## 2024-08-25 LAB — MINIMUM INHIBITORY CONC. (1 DRUG)

## 2024-08-25 LAB — LIPID PANEL
Cholesterol: 123 mg/dL (ref 0–200)
HDL: 32 mg/dL — ABNORMAL LOW
LDL Cholesterol: 69 mg/dL (ref 0–99)
Total CHOL/HDL Ratio: 3.8 ratio
Triglycerides: 107 mg/dL
VLDL: 21 mg/dL (ref 0–40)

## 2024-08-25 LAB — HEPARIN LEVEL (UNFRACTIONATED): Heparin Unfractionated: 0.14 [IU]/mL — ABNORMAL LOW (ref 0.30–0.70)

## 2024-08-25 LAB — MIC RESULT

## 2024-08-25 MED ORDER — HEPARIN (PORCINE) 25000 UT/250ML-% IV SOLN
1450.0000 [IU]/h | INTRAVENOUS | Status: DC
  Administered 2024-08-25: 900 [IU]/h via INTRAVENOUS
  Administered 2024-08-26: 1150 [IU]/h via INTRAVENOUS
  Filled 2024-08-25 (×2): qty 250

## 2024-08-25 MED ORDER — NYSTATIN 100000 UNIT/ML MT SUSP
5.0000 mL | Freq: Four times a day (QID) | OROMUCOSAL | Status: DC
  Administered 2024-08-25 – 2024-08-30 (×19): 500000 [IU] via ORAL
  Filled 2024-08-25 (×18): qty 5

## 2024-08-25 MED ORDER — PRAVASTATIN SODIUM 40 MG PO TABS
80.0000 mg | ORAL_TABLET | Freq: Every day | ORAL | Status: DC
  Administered 2024-08-25 – 2024-08-28 (×4): 80 mg via ORAL
  Filled 2024-08-25 (×4): qty 2

## 2024-08-25 NOTE — Progress Notes (Signed)
 PHARMACY - ANTICOAGULATION CONSULT NOTE  Pharmacy Consult for Heparin  Indication: DVT, bilateral upper extremities  Allergies[1]  Patient Measurements: Height: 5' 1 (154.9 cm) Weight: 81.4 kg (179 lb 7.3 oz) IBW/kg (Calculated) : 47.8 HEPARIN  DW (KG): 65.4  Vital Signs: Temp: 99.1 F (37.3 C) (01/02 1221) Temp Source: Oral (01/02 1221) BP: 120/56 (01/02 1221) Pulse Rate: 94 (01/02 1221)  Labs: Recent Labs    08/23/24 0452 08/23/24 0852 08/24/24 0522  HGB 13.7 13.7 12.8  HCT 41.4 41.3 38.8  PLT 224 252 228  CREATININE  --  0.70 0.71  CKTOTAL  --  123  --     Estimated Creatinine Clearance: 58.7 mL/min (by C-G formula based on SCr of 0.71 mg/dL).   Medical History: Past Medical History:  Diagnosis Date   Alkaline phosphatase elevation    Asthma    Breast cancer (HCC) 2011   Carpal tunnel syndrome    bilateral   Chronic HFrEF (heart failure with reduced ejection fraction) (HCC)    Cirrhosis, non-alcoholic (HCC)    Closed fracture of unspecified part of radius (alone)    DDD (degenerative disc disease), lumbar    Depression    pt denies   Diabetes mellitus without complication (HCC)    Enlarged heart    Fibromyalgia    GERD (gastroesophageal reflux disease)    History of kidney stones    HTN (hypertension)    Hypothyroidism    IBS (irritable bowel syndrome)    LBBB (left bundle branch block)    Microcytic anemia 05/14/2017   Mitral regurgitation    NICM (nonischemic cardiomyopathy) (HCC)    Pneumonia    Presence of permanent cardiac pacemaker    Pulmonary hypertension (HCC)    PVD (peripheral vascular disease)    2019   Sleep apnea    uses cpap   Tricuspid regurgitation    Ventricular tachyarrhythmia [facility])    Assessment: 74 yr old female to begin IV heparin  for bilateral upper extremity DVT per duplex. Has been on Heparin  5000 units SQ Q8hrs for VTE prophylaxis.  Last dose 0522am. Acute generalized weakness on 1/1 and Code Stroke called. CT of  head showed small age indeterminate lacunar infarct in right thalamus. Noted Neuro/Stroke team dose note feel this was a stroke and plan repeat CT scan in 24 hrs.  Will dose heparin  conservatively without bolus and use low therapeutic goal for now.  Goal of Therapy:  Heparin  level 0.3-0.5 units/ml Monitor platelets by anticoagulation protocol: Yes   Plan:  Discontinue SQ heparin . Heparin  drip to begin at 900 units/hr. Heparin  level ~8 hrs after drip begins. Daily heparin  level and CBC while on heparin .  Genaro Zebedee Calin, RPh 08/25/2024,1:10 PM      [1]  Allergies Allergen Reactions   Injectafer [Ferric Carboxymaltose] Shortness Of Breath    Mild SOB following injectafer infusion    Penicillins Anaphylaxis and Rash    Tolerated ceftin  2020   Accupril [Quinapril Hcl] Other (See Comments)    Hyperkalemia=high potassium level   Celebrex [Celecoxib] Other (See Comments)    Hx hematemesis, bleeding ulcer   Cozaar  [Losartan  Potassium] Itching   Dml Forte Rash   Entresto [Sacubitril-Valsartan] Other (See Comments)    Hyperkalemia   Flonase  [Fluticasone ] Other (See Comments)    Hx nasal ulcer   Lactose Intolerance (Gi) Diarrhea   Lipitor [Atorvastatin ] Other (See Comments)    Myalgias    Nasonex  [Mometasone  Furoate] Other (See Comments)    Epistaxis    Nsaids Other (  See Comments)    Hx hematemesis, bleeding ulcer   Advil [Ibuprofen] Nausea And Vomiting    Hx hematemesis, bleeding ulcer   Baking Soda-Fluoride [Sodium Fluoride] Itching and Rash   Bayer Aspirin  [Aspirin ] Other (See Comments)    Hx hematemesis, bleeding ulcer   Chamomile Hives   Clindamycin /Lincomycin Rash   Codeine Nausea And Vomiting   Lasix  [Furosemide ] Other (See Comments)    Tinnitus  Hypotension   Miralax  [Polyethylene Glycol (Macrogol)] Other (See Comments)    Leaky gut syndrome   Quinine Rash   Reclast  [Zoledronic  Acid] Rash and Other (See Comments)    Tremors    Rifadin [Rifampin] Other  (See Comments)    Unknown reaction   Singulair  [Montelukast ] Other (See Comments)    Dizziness    Sulfa Antibiotics Rash   Ultram  [Tramadol ] Anxiety and Rash   Vesicare  [Solifenacin ] Other (See Comments)    Urinary retention   Wellbutrin  [Bupropion ] Other (See Comments)    Myalgias  Skin crawling sensation   Wool Alcohol  [Lanolin] Rash

## 2024-08-25 NOTE — Progress Notes (Signed)
 " PROGRESS NOTE  CODA FILLER FMW:[id number] DOB: [birth year] DOA: 08/16/2024 PCP: [name], MD   LOS: 9 days   Brief Narrative / Interim history: 74 year old female with history of chronic systolic CHF, HTN, PVD, DM2, presence of pacemaker, hypothyroidism comes into the hospital with shortness of breath.  Of note, she was recently hospitalized mid-December for CHF exacerbation, sent home after diuresis, but developed recurrent dyspnea and lower extremity edema at home and came back to the ED.  She was found to have MRSA bacteremia.  ID consulted and following.  Due to presence of pacemaker cardiology consulted as well.  She is deemed to be a very poor candidate for pacer lead extraction given end-stage heart failure and poor prognosis.  Palliative consulted as well  Subjective / 24h Interval events: Seen in bed, complains of generalized weakness.  Neurology is at bedside.  Yesterday afternoon she complained of generalized weakness and a code stroke was initiated  Assesement and Plan: Principal problem MRSA bacteremia -ID following, appreciate input.  Currently she is on daptomycin .  She has a pacemaker in place but not a candidate to remove so likely needs suppressive prolonged antibiotics - A TEE was not pursued due to high risk.  Blood cultures were positive for MRSA on 12/25 and 12/27, and surveillance cultures on 12/29 initially appeared to be negative, now unfortunately 1 out of 4 bottles are still showing MRSA.  This is somewhat of a poor prognosis as she may not be able to completely clear her bacteremia without pacemaker removal.  I see that EP has been reengaged and they will readdress - Appreciate ID follow-up  Active problems Acute generalized weakness -this is happening on 08/24/2024.  A code stroke was called, she was evaluated at the bedside by the neurology team.  A CT of the head showed a small age-indeterminate lacunar infarct in the right thalamus.  Neurology/stroke  team evaluated patient today, discussed at bedside with Dr. Rosemarie, he does not really think this was a stroke but will repeat a CT scan at 24 hours.  She is unable to get an MRI due to pacemaker  Skin rash -in the setting of MRSA bacteremia, possibly IgA vasculitis.  Continue to closely monitor, improved today  Acute on chronic systolic CHF -most recent 2D echo showed LVEF less than 20%, global hypokinesis, severe LV dilatation, grade 3 diastolic dysfunction, RV systolic function moderate reduction, RVSP 48, biatrial severe dilatation and small pericardial effusion.  She was diuresed - Volume status better now.  Cardiology following, plan to resume Demadex  today/tomorrow  CHB-with pacemaker.  Recently had subsequent addition of RV-the lead on October 2025 for ATP for refractory VT  History of VT-no arrhythmias.  Continue suppressive treatment with amiodarone  as well as quinidine  Hypothyroidism -continue Synthroid   Essential hypertension-continue spironolactone   NASH cirrhosis-no signs of decompensation  Underlying COPD-no wheezing, stable, continue bronchodilator therapy  History of left breast cancer-outpatient follow-up, in remission  Microcytic anemia-continue to monitor, no bleeding  Hyponatremia-overall stable, no large shifts noted  Obesity, class I-BMI 33.3.    Scheduled Meds:  amiodarone   200 mg Oral Daily   arformoterol   15 mcg Nebulization BID   aspirin   81 mg Oral Daily   budesonide  (PULMICORT ) nebulizer solution  0.25 mg Nebulization BID   Chlorhexidine  Gluconate Cloth  6 each Topical Daily   heparin   5,000 Units Subcutaneous Q8H   insulin  aspart  0-5 Units Subcutaneous QHS   insulin  aspart  0-9 Units Subcutaneous TID WC  lactobacillus  1 g Oral TID WC   levothyroxine   112 mcg Oral Daily   multivitamin with minerals  1 tablet Oral Daily   mouth rinse  15 mL Mouth Rinse 4 times per day   pantoprazole   40 mg Oral QAC breakfast   pravastatin   40 mg Oral QHS    quiNIDine sulfate   200 mg Oral TID   revefenacin   175 mcg Nebulization Daily   sodium chloride  flush  3 mL Intravenous Q12H   sodium chloride  flush  3 mL Intravenous Q12H   spironolactone   12.5 mg Oral Daily   Continuous Infusions:  DAPTOmycin  500 mg (08/24/24 1607)   PRN Meds:.acetaminophen  **OR** acetaminophen , HYDROcodone -acetaminophen , levalbuterol , ondansetron  **OR** ondansetron  (ZOFRAN ) IV, mouth rinse, senna-docusate, traZODone   Current Outpatient Medications  Medication Instructions   albuterol  (PROVENTIL ) 2.5 mg, Nebulization, Daily PRN   albuterol  (VENTOLIN  HFA) 108 (90 Base) MCG/ACT inhaler 2 puffs, Inhalation, Every 6 hours PRN   alendronate  (FOSAMAX ) 70 MG tablet TAKE 1 TABLET BY MOUTH EVERY 7  DAYS WITH FULL GLASS OF WATER  ON AN EMPTY STOMACH   amiodarone  (PACERONE ) 200 mg, Oral, Daily   aspirin  81 mg, Oral, Daily   Calcium  Carbonate-Vit D-Min (CALCIUM  600+D PLUS MINERALS) 600-400 MG-UNIT TABS 1 tab p.o. twice daily   EPINEPHrine  (EPI-PEN) 0.3 mg, Intramuscular, As needed   fexofenadine  (ALLEGRA ) 180 mg, Oral, Daily   fluticasone -salmeterol (WIXELA INHUB) 250-50 MCG/ACT AEPB 1 puff, Inhalation, 2 times daily   levothyroxine  (SYNTHROID ) 112 mcg, Oral, Daily   metFORMIN  (GLUCOPHAGE -XR) 500 mg, Oral, Daily, Per 08/09/24 discharge states 1 tablet twice daily - patient states PCP has her on this once daily and they plan to discuss once or twice daily at 08/14/24 appt   Multiple Vitamins-Minerals (MULTIVITAMIN WOMEN 50+) TABS 1 tablet, Daily   pantoprazole  (PROTONIX ) 40 mg, Oral, Daily before breakfast   pravastatin  (PRAVACHOL ) 40 mg, Oral, Daily at bedtime   quiNIDine sulfate  200 mg, Oral, 3 times daily   SPIRIVA  RESPIMAT 2.5 MCG/ACT AERS 2 puffs, Inhalation, Daily   spironolactone  (ALDACTONE ) 12.5 mg, Oral, Daily   torsemide  (DEMADEX ) 20 mg, Oral, Daily, Take 2 tablets daily in case of weight gain 2 to 3 lbs in 24 hrs or 5 lbs in 7 days   valACYclovir  (VALTREX ) 1,000 mg,  Oral, 2 times daily    Diet Orders (From admission, onward)     Start     Ordered   08/20/24 1220  Diet Heart Room service appropriate? Yes with Assist; Fluid consistency: Thin  Diet effective now       Question Answer Comment  Room service appropriate? Yes with Assist   Fluid consistency: Thin      08/20/24 1219            DVT prophylaxis: heparin  injection 5,000 Units Start: 08/16/24 1400 SCDs Start: 08/16/24 1020   Lab Results  Component Value Date   PLT 228 08/24/2024      Code Status: Limited: Do not attempt resuscitation (DNR) -DNR-LIMITED -Do Not Intubate/DNI   Family Communication: No family at bedside  Status is: Inpatient Remains inpatient appropriate because: Severity of illness   Level of care: Telemetry  Consultants:  ID Cardiology  Objective: Vitals:   08/25/24 0259 08/25/24 0400 08/25/24 0500 08/25/24 0730  BP: (!) 121/59 99/62  119/71  Pulse: 88 89  89  Resp: 18 20  (!) 22  Temp: 97.9 F (36.6 C) 97.9 F (36.6 C)    TempSrc: Oral Oral    SpO2: 96%  97%  95%  Weight:   81.4 kg   Height:       No intake or output data in the 24 hours ending 08/25/24 1120  Wt Readings from Last 3 Encounters:  08/25/24 81.4 kg  08/14/24 81.2 kg  08/09/24 79.4 kg    Examination:  Constitutional: NAD Eyes: lids and conjunctivae normal, no scleral icterus ENMT: mmm Neck: normal, supple Respiratory: clear to auscultation bilaterally, no wheezing, no crackles. Normal respiratory effort.  Cardiovascular: Regular rate and rhythm, no murmurs / rubs / gallops. No LE edema. Abdomen: soft, no distention, no tenderness. Bowel sounds positive.   Data Reviewed: I have independently reviewed following labs and imaging studies   CBC Recent Labs  Lab 08/19/24 1115 08/20/24 0340 08/23/24 0452 08/23/24 0852 08/24/24 0522  WBC 13.2* 10.8* 10.2 10.6* 11.9*  HGB 12.7 12.6 13.7 13.7 12.8  HCT 39.5 37.8 41.4 41.3 38.8  PLT 157 176 224 252 228  MCV 93.8 92.9  94.7 92.8 92.6  MCH 30.2 31.0 31.4 30.8 30.5  MCHC 32.2 33.3 33.1 33.2 33.0  RDW 14.5 14.5 14.7 14.7 14.9  LYMPHSABS  --   --   --  1.3  --   MONOABS  --   --   --  0.5  --   EOSABS  --   --   --  0.1  --   BASOSABS  --   --   --  0.1  --     Recent Labs  Lab 08/19/24 1115 08/20/24 0340 08/21/24 0400 08/22/24 0516 08/23/24 0852 08/24/24 0522  NA 129* 134* 134* 133* 132* 133*  K 4.5 3.7 4.1 4.3 4.6 4.8  CL 98 99 101 100 97* 99  CO2 22 28 24 25 27 26   GLUCOSE 110* 112* 108* 112* 120* 117*  BUN 17 19 13 9 9 12   CREATININE 0.59 0.68 0.58 0.65 0.70 0.71  CALCIUM  9.4 9.1 9.5 9.5 9.8 9.9  AST 133* 144* 114* 112*  --  83*  ALT 82* 88* 77* 73*  --  55*  ALKPHOS 204* 208* 206* 234*  --  205*  BILITOT 1.1 0.7 0.6 0.6  --  0.7  ALBUMIN 2.9* 2.8* 2.8* 2.7*  --  2.9*  MG 2.2 2.1 2.0  --   --  1.9    ------------------------------------------------------------------------------------------------------------------ Recent Labs    08/25/24 1003  CHOL 123  HDL 32*  LDLCALC 69  TRIG 892  CHOLHDL 3.8    Lab Results  Component Value Date   HGBA1C 5.9 (H) 08/17/2024   ------------------------------------------------------------------------------------------------------------------ No results for input(s): TSH, T4TOTAL, T3FREE, THYROIDAB in the last 72 hours.  Invalid input(s): FREET3  Cardiac Enzymes No results for input(s): CKMB, TROPONINI, MYOGLOBIN in the last 168 hours.  Invalid input(s): CK ------------------------------------------------------------------------------------------------------------------    Component Value Date/Time   BNP 1,744.7 (H) 08/02/2024 1942   BNP 193 (H) 08/10/2022 1158    CBG: Recent Labs  Lab 08/24/24 1151 08/24/24 1526 08/24/24 1654 08/24/24 2147 08/25/24 0618  GLUCAP 120* 124* 106* 136* 129*    Recent Results (from the past 240 hours)  Resp panel by RT-PCR (RSV, Flu A&B, Covid) Anterior Nasal Swab     Status:  None   Collection Time: 08/16/24  8:19 AM   Specimen: Anterior Nasal Swab  Result Value Ref Range Status   SARS Coronavirus 2 by RT PCR NEGATIVE NEGATIVE Final   Influenza A by PCR NEGATIVE NEGATIVE Final   Influenza B by PCR NEGATIVE NEGATIVE  Final    Comment: (NOTE) The Xpert Xpress SARS-CoV-2/FLU/RSV plus assay is intended as an aid in the diagnosis of influenza from Nasopharyngeal swab specimens and should not be used as a sole basis for treatment. Nasal washings and aspirates are unacceptable for Xpert Xpress SARS-CoV-2/FLU/RSV testing.  Fact Sheet for Patients: bloggercourse.com  Fact Sheet for Healthcare Providers: seriousbroker.it  This test is not yet approved or cleared by the [place]  FDA and has been authorized for detection and/or diagnosis of SARS-CoV-2 by FDA under an Emergency Use Authorization (EUA). This EUA will remain in effect (meaning this test can be used) for the duration of the COVID-19 declaration under Section 564(b)(1) of the Act, 21 U.S.C. section 360bbb-3(b)(1), unless the authorization is terminated or revoked.     Resp Syncytial Virus by PCR NEGATIVE NEGATIVE Final    Comment: (NOTE) Fact Sheet for Patients: bloggercourse.com  Fact Sheet for Healthcare Providers: seriousbroker.it  This test is not yet approved or cleared by the [place]  FDA and has been authorized for detection and/or diagnosis of SARS-CoV-2 by FDA under an Emergency Use Authorization (EUA). This EUA will remain in effect (meaning this test can be used) for the duration of the COVID-19 declaration under Section 564(b)(1) of the Act, 21 U.S.C. section 360bbb-3(b)(1), unless the authorization is terminated or revoked.  Performed at [facility], [address] [place], [place] [address]   MRSA Next Gen by PCR, Nasal     Status: Abnormal   Collection Time:  08/17/24  4:51 PM   Specimen: Nasal Mucosa; Nasal Swab  Result Value Ref Range Status   MRSA by PCR Next Gen DETECTED (A) NOT DETECTED Final    Comment: RESULT CALLED TO, READ BACK BY AND VERIFIED WITH: L BROWN RN 08/17/2024 @ 2142 BY AB (NOTE) The GeneXpert MRSA Assay (FDA approved for NASAL specimens only), is one component of a comprehensive MRSA colonization surveillance program. It is not intended to diagnose MRSA infection nor to guide or monitor treatment for MRSA infections. Test performance is not FDA approved in patients less than 82 years old. Performed at [facility], [address], [place], [place] [address]   Culture, blood (Routine X 2) w Reflex to ID Panel     Status: Abnormal   Collection Time: 08/17/24  5:58 PM   Specimen: BLOOD LEFT HAND  Result Value Ref Range Status   Specimen Description BLOOD LEFT HAND  Final   Special Requests   Final    BOTTLES DRAWN AEROBIC AND ANAEROBIC Blood Culture adequate volume   Culture  Setup Time   Final    GRAM POSITIVE COCCI IN BOTH AEROBIC AND ANAEROBIC BOTTLES CRITICAL RESULT CALLED TO, READ BACK BY AND VERIFIED WITH: PHARMD J.FRENS AT 0802 ON 08/18/2024 BY T.SAAD.    Culture (A)  Final    METHICILLIN RESISTANT STAPHYLOCOCCUS AUREUS Sent to [facility] for further susceptibility testing. Performed at [facility], [address], [place], [place] [address]    Report Status 08/21/2024 FINAL  Final   Organism ID, Bacteria METHICILLIN RESISTANT STAPHYLOCOCCUS AUREUS  Final      Susceptibility   Methicillin resistant staphylococcus aureus - MIC*    CIPROFLOXACIN  >=8 RESISTANT Resistant     ERYTHROMYCIN  >=8 RESISTANT Resistant     GENTAMICIN  <=0.5 SENSITIVE Sensitive     OXACILLIN >=4 RESISTANT Resistant     TETRACYCLINE <=1 SENSITIVE Sensitive     VANCOMYCIN  1 SENSITIVE Sensitive     TRIMETH/SULFA <=10 SENSITIVE  Sensitive     CLINDAMYCIN  <=0.25 SENSITIVE Sensitive     RIFAMPIN <=0.5 SENSITIVE Sensitive      Inducible Clindamycin  NEGATIVE Sensitive     LINEZOLID 2 SENSITIVE Sensitive     * METHICILLIN RESISTANT STAPHYLOCOCCUS AUREUS  Blood Culture ID Panel (Reflexed)     Status: Abnormal   Collection Time: 08/17/24  5:58 PM  Result Value Ref Range Status   Enterococcus faecalis NOT DETECTED NOT DETECTED Final   Enterococcus Faecium NOT DETECTED NOT DETECTED Final   Listeria monocytogenes NOT DETECTED NOT DETECTED Final   Staphylococcus species DETECTED (A) NOT DETECTED Final    Comment: CRITICAL RESULT CALLED TO, READ BACK BY AND VERIFIED WITH: PHARMD J.FRENS AT 0802 ON 08/18/2024 BY T.SAAD.    Staphylococcus aureus (BCID) DETECTED (A) NOT DETECTED Final    Comment: Methicillin (oxacillin)-resistant Staphylococcus aureus (MRSA). MRSA is predictably resistant to beta-lactam antibiotics (except ceftaroline). Preferred therapy is vancomycin  unless clinically contraindicated. Patient requires contact precautions if  hospitalized. CRITICAL RESULT CALLED TO, READ BACK BY AND VERIFIED WITH: PHARMD J.FRENS AT 0802 ON 08/18/2024 BY T.SAAD.    Staphylococcus epidermidis NOT DETECTED NOT DETECTED Final   Staphylococcus lugdunensis NOT DETECTED NOT DETECTED Final   Streptococcus species NOT DETECTED NOT DETECTED Final   Streptococcus agalactiae NOT DETECTED NOT DETECTED Final   Streptococcus pneumoniae NOT DETECTED NOT DETECTED Final   Streptococcus pyogenes NOT DETECTED NOT DETECTED Final   A.calcoaceticus-baumannii NOT DETECTED NOT DETECTED Final   Bacteroides fragilis NOT DETECTED NOT DETECTED Final   Enterobacterales NOT DETECTED NOT DETECTED Final   Enterobacter cloacae complex NOT DETECTED NOT DETECTED Final   Escherichia coli NOT DETECTED NOT DETECTED Final   Klebsiella aerogenes NOT DETECTED NOT DETECTED Final   Klebsiella oxytoca NOT DETECTED NOT DETECTED Final   Klebsiella pneumoniae NOT DETECTED NOT DETECTED Final   Proteus species NOT DETECTED NOT DETECTED Final   Salmonella species NOT  DETECTED NOT DETECTED Final   Serratia marcescens NOT DETECTED NOT DETECTED Final   Haemophilus influenzae NOT DETECTED NOT DETECTED Final   Neisseria meningitidis NOT DETECTED NOT DETECTED Final   Pseudomonas aeruginosa NOT DETECTED NOT DETECTED Final   Stenotrophomonas maltophilia NOT DETECTED NOT DETECTED Final   Candida albicans NOT DETECTED NOT DETECTED Final   Candida auris NOT DETECTED NOT DETECTED Final   Candida glabrata NOT DETECTED NOT DETECTED Final   Candida krusei NOT DETECTED NOT DETECTED Final   Candida parapsilosis NOT DETECTED NOT DETECTED Final   Candida tropicalis NOT DETECTED NOT DETECTED Final   Cryptococcus neoformans/gattii NOT DETECTED NOT DETECTED Final   Meth resistant mecA/C and MREJ DETECTED (A) NOT DETECTED Final    Comment: CRITICAL RESULT CALLED TO, READ BACK BY AND VERIFIED WITH: PHARMD J.FRENS AT 0802 ON 08/18/2024 BY T.SAAD. Performed at [facility], [address], [place], [place] [address]   MIC (1 Drug)-     Status: Abnormal   Collection Time: 08/17/24  5:58 PM  Result Value Ref Range Status   Min Inhibitory Conc (1 Drug) Preliminary report (A)  Final    Comment: (NOTE) Performed At: [facility] [address] [place], [place] [id number] Jennette Shorter MD Ey:[id number]    Source LAB [phone] MRSA DAPTO BLOOS  Corrected    Comment: Performed at [facility], [address], [place], [place] [address] CORRECTED ON 12/30 AT 0800: PREVIOUSLY REPORTED AS BLOOD   MIC Result     Status: Abnormal   Collection Time: 08/17/24  5:58 PM  Result Value Ref Range Status   Result 1 (MIC) Comment (A)  Final    Comment: (NOTE) Methicillin - resistant Staphylococcus aureus Identification performed by account, not confirmed by this laboratory. DAPTOMYCIN  Performed At: [facility] [address] [place], [place] [id number] Jennette Shorter MD Ey:[id number]   Culture, blood (Routine X 2) w Reflex to ID Panel     Status: Abnormal    Collection Time: 08/17/24  6:01 PM   Specimen: BLOOD RIGHT HAND  Result Value Ref Range Status   Specimen Description BLOOD RIGHT HAND  Final   Special Requests   Final    BOTTLES DRAWN AEROBIC AND ANAEROBIC Blood Culture results may not be optimal due to an inadequate volume of blood received in culture bottles   Culture  Setup Time   Final    GRAM POSITIVE COCCI IN BOTH AEROBIC AND ANAEROBIC BOTTLES CRITICAL VALUE NOTED.  VALUE IS CONSISTENT WITH PREVIOUSLY REPORTED AND CALLED VALUE.    Culture (A)  Final    STAPHYLOCOCCUS AUREUS SUSCEPTIBILITIES PERFORMED ON PREVIOUS CULTURE WITHIN THE LAST 5 DAYS. Performed at [facility], [address] [place], [place] [address]    Report Status 08/21/2024 FINAL  Final  Culture, blood (Routine X 2) w Reflex to ID Panel     Status: Abnormal   Collection Time: 08/19/24 11:15 AM   Specimen: BLOOD  Result Value Ref Range Status   Specimen Description BLOOD BLOOD RIGHT ARM  Final   Special Requests   Final    BOTTLES DRAWN AEROBIC AND ANAEROBIC Blood Culture results may not be optimal due to an inadequate volume of blood received in culture bottles   Culture  Setup Time   Final    GRAM POSITIVE COCCI IN CLUSTERS IN BOTH AEROBIC AND ANAEROBIC BOTTLES Gram Stain Report Called to,Read Back By and Verified With: PHARMD G ABBOTT 08/20/2024 @ 0552 BY AB CRITICAL VALUE NOTED.  VALUE IS CONSISTENT WITH PREVIOUSLY REPORTED AND CALLED VALUE.    Culture (A)  Final    STAPHYLOCOCCUS AUREUS SUSCEPTIBILITIES PERFORMED ON PREVIOUS CULTURE WITHIN THE LAST 5 DAYS. Performed at [facility], [address] [place], [place] [address]    Report Status 08/22/2024 FINAL  Final  Culture, blood (Routine X 2) w Reflex to ID Panel     Status: Abnormal   Collection Time: 08/19/24 11:15 AM   Specimen: BLOOD LEFT ARM  Result Value Ref Range Status   Specimen Description BLOOD LEFT ARM  Final   Special Requests   Final    BOTTLES DRAWN AEROBIC ONLY  Blood Culture results may not be optimal due to an inadequate volume of blood received in culture bottles   Culture  Setup Time   Final    GRAM POSITIVE COCCI IN CLUSTERS AEROBIC BOTTLE ONLY CRITICAL VALUE NOTED.  VALUE IS CONSISTENT WITH PREVIOUSLY REPORTED AND CALLED VALUE.    Culture (A)  Final    STAPHYLOCOCCUS AUREUS SUSCEPTIBILITIES PERFORMED ON PREVIOUS CULTURE WITHIN THE LAST 5 DAYS. Performed at [facility], [address], [place], [place] [address]    Report Status 08/22/2024 FINAL  Final  Culture, blood (Routine X 2) w Reflex to ID Panel     Status: Abnormal (Preliminary result)   Collection Time: 08/21/24 10:46 AM   Specimen: BLOOD  Result Value Ref Range Status   Specimen Description BLOOD BLOOD RIGHT ARM  Final   Special Requests   Final    BOTTLES DRAWN AEROBIC AND ANAEROBIC Blood  Culture adequate volume   Culture  Setup Time   Final    GRAM POSITIVE COCCI ANAEROBIC BOTTLE ONLY CRITICAL VALUE NOTED.  VALUE IS CONSISTENT WITH PREVIOUSLY REPORTED AND CALLED VALUE.    Culture (A)  Final    STAPHYLOCOCCUS AUREUS SUSCEPTIBILITIES PERFORMED ON PREVIOUS CULTURE WITHIN THE LAST 5 DAYS. Performed at [facility], [address] [place], [place] [address]    Report Status PENDING  Incomplete  Culture, blood (Routine X 2) w Reflex to ID Panel     Status: None (Preliminary result)   Collection Time: 08/21/24 10:46 AM   Specimen: BLOOD  Result Value Ref Range Status   Specimen Description BLOOD BLOOD LEFT HAND  Final   Special Requests   Final    BOTTLES DRAWN AEROBIC AND ANAEROBIC Blood Culture results may not be optimal due to an inadequate volume of blood received in culture bottles   Culture   Final    NO GROWTH 4 DAYS Performed at [facility], [address], [place], [place] [address]    Report Status PENDING  Incomplete     Radiology Studies: CT ANGIO HEAD NECK W WO CM (CODE STROKE) Result Date: 08/24/2024 EXAM: CTA Head and Neck with  Intravenous Contrast. CLINICAL HISTORY: Neuro deficit, acute, stroke suspected. TECHNIQUE: Axial CTA images of the head and neck performed with intravenous contrast. MIP reconstructed images were created and reviewed. Axial computed tomography images of the head/brain performed without intravenous contrast. Note: Per PQRS, the description of internal carotid artery percent stenosis, including 0 percent or normal exam, is based on [employer] American Symptomatic Carotid Endarterectomy Trial (NASCET) criteria. Dose reduction technique was used including one or more of the following: automated exposure control, adjustment of mA and kV according to patient size, and/or iterative reconstruction. CONTRAST: With and without. COMPARISON: CT head 08/24/2024. FINDINGS: CTA NECK: COMMON CAROTID ARTERIES: Mild atherosclerosis at the right carotid bifurcation without hemodynamically significant stenosis. Mild atherosclerosis at the left carotid bifurcation without hemodynamically significant stenosis. No dissection or occlusion. INTERNAL CAROTID ARTERIES: No stenosis by NASCET criteria. No dissection or occlusion. VERTEBRAL ARTERIES: The right vertebral artery is dominant. The vertebral artery origins are slightly obscured due to streak artifact from arthroplasty hardware as well as artifact from dense venous contrast. Visualized portions of the vertebral arteries are patent to the vertebrobasilar confluence. Mild atherosclerosis of the right V4 segment resulting in mild stenosis. No dissection or occlusion. 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 The anterior cerebral arteries are patent bilaterally and primarily supplied by the left carotid artery. No aneurysm. MIDDLE CEREBRAL ARTERIES: The middle cerebral arteries are patent bilaterally and primarily supplied by the left carotid artery. There is moderate stenosis of a proximal M2 branch of the left  MCA. No aneurysm. POSTERIOR CEREBRAL ARTERIES: No significant stenosis. No occlusion. No aneurysm. BASILAR ARTERY: No significant stenosis. No occlusion. No aneurysm. OTHER: Partially visualized left chest wall pacer device with leads noted within the right atrium and ventricle as well as within the coronary sinus. Cardiomegaly. Bilateral pulmonary opacities and bronchovascular thickening which could reflect pulmonary edema. Moderate atherosclerosis at the left subclavian artery origin without significant stenosis. SOFT TISSUES: There is a 0.9 cm nodular focus of enhancing soft tissue in the right aspect of the vallecula abutting the epiglottis and the glossoepiglottic fold. Recommend nonemergent correlation with direct visualization. No masses or lymphadenopathy. BONES: Right shoulder arthroplasty hardware is partially visualized. No acute  osseous abnormality. IMPRESSION: 1. No acute large vessel occlusion. 2. Moderate stenosis of a proximal M2 branch of the left MCA. 3. Mild stenosis of the left cavernous and supraclinoid ICA. 4. Mild atherosclerosis of the right V4 segment resulting in mild stenosis. 5. 0.9 cm nodular focus of enhancing soft tissue in the right aspect of the vallecula abutting the epiglottis and the glossoepiglottic fold. Recommend nonemergent ENT evaluation with direct visualization. Electronically signed by: Donnice Mania MD 08/24/2024 04:28 PM EST RP Workstation: [id number]   CT HEAD CODE STROKE WO CONTRAST Result Date: 08/24/2024 EXAM: CT HEAD WITHOUT CONTRAST 08/24/2024 03:45:00 PM TECHNIQUE: CT of the head was performed without the administration of intravenous contrast. Automated exposure control, iterative reconstruction, and/or weight based adjustment of the mA/kV was utilized to reduce the radiation dose to as low as reasonably achievable. COMPARISON: 08/14/2023 CLINICAL HISTORY: Neuro deficit, acute, stroke suspected. FINDINGS: BRAIN AND VENTRICLES: Mild chronic microvascular ischemic  changes. Small age indeterminate lacunar infarct in the right thalamus. Atherosclerosis of the carotid siphons and intracranial right vertebral artery. No acute hemorrhage. No hydrocephalus. No extra-axial collection. No mass effect or midline shift. ORBITS: Bilateral lens replacement. SINUSES: Postsurgical changes of the left ethmoid sinus and left aspect of the nasal cavity. Mucosal thickening throughout the paranasal sinuses with findings suggestive of left sided chronic sinusitis. SOFT TISSUES AND SKULL: No acute soft tissue abnormality. No skull fracture. [place] Stroke Program Early CT (ASPECT) score: Ganglionic (caudate, ic, lentiform nucleus, insula, M1-m3): 7 Supraganglionic (m4-m6): 3 Total: 10 IMPRESSION: 1. No acute intracranial hemorrhage. 2. ASPECT score 10. 3. Small age indeterminate lacunar infarct in the right thalamus. 4. Findings messaged to Dr. Matthews via the [facility] messaging system at 3:57 PM on 08/24/24. Electronically signed by: Donnice Mania MD 08/24/2024 03:58 PM EST RP Workstation: [id number]     Nilda Fendt, MD, PhD [facility]  Between 7 am - 7 pm I am available, please contact me via Amion (for emergencies) or Securechat (non urgent messages)  Between 7 pm - 7 am I am not available, please contact night coverage MD/APP via Amion  "

## 2024-08-25 NOTE — Plan of Care (Signed)

## 2024-08-25 NOTE — Progress Notes (Signed)
 Physical Therapy Treatment Patient Details Name: Tanya Hogan MRN: [id number] DOB: April 02, 1951 Today's Date: 08/25/2024   History of Present Illness Pt is 74 yo with recent hospitalization on 12/11 with swelling and difficulty breathing. Currently pt was admitted 12/24 for acute on chronic HF. 12/25 was minimally responsive requiring bipap. Blood cultures showed MRSA. Had facial droop with numbness on 08/24/24 with code stroke called and CT showing age indeterminate R thalamic lacunar infarct, (unable to do MRI due to PPM).  EFY:[id number] revision of pacemaker/AICD, breast cancer, chronic heart failure, nonalcoholic cirrhosis, BiV pacemake, depression, DM2, GERD, HTN, hypothyroidism, valvular heart disease, pulmonary hypertension, sleep apnea and ventricular tachy arrhythmia    PT Comments  Patient continues with poor activity tolerance, seems to continue to need more assistance since onset of the rash and other medical issues.  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  Feel she may need post-acute inpatient rehab (<3 hours/day) prior to d/c home.  PT will continue to follow while in the acute setting.     If plan is discharge home, recommend the following: A little help with walking and/or transfers;A lot of help with bathing/dressing/bathroom   Can travel by private vehicle        Equipment Recommendations  None recommended by PT    Recommendations for Other Services       Precautions / Restrictions Precautions Precautions: Fall Recall of Precautions/Restrictions: Impaired Precaution/Restrictions Comments: watch BP     Mobility  Bed Mobility Overal bed mobility: Needs Assistance Bed Mobility: Supine to Sit, Sit to Supine     Supine to sit: Used rails, HOB elevated, Min assist Sit to supine: Mod assist   General bed mobility comments: assist for lifting trunk and cues for moving legs off EOB and pulling up with HHA; to  supine assist for legs into bed    Transfers Overall transfer level: Needs assistance Equipment used: Rolling Ruhlman (2 wheels) Transfers: Sit to/from Stand, Bed to chair/wheelchair/BSC Sit to Stand: Min assist   Step pivot transfers: Min assist       General transfer comment: up to [facility] with RW and increased time some help for sit to stand for anterior weight shift, stepping to BSC A for RW management and for balance with increased time    Ambulation/Gait Ambulation/Gait assistance: Contact guard assist Gait Distance (Feet): 30 Feet Assistive device: Rolling Cowger (2 wheels) Gait Pattern/deviations: Step-through pattern, Decreased stride length, Shuffle, Trunk flexed       General Gait Details: got to hallway then pt requesting to return then reported needing to urinate again so A for RW turning and proximity to get to [facility]   Stairs             Wheelchair Mobility     Tilt Bed    Modified Rankin (Stroke Patients Only)       Balance Overall balance assessment: Needs assistance   Sitting balance-[name] Scale: Fair     Standing balance support: Bilateral upper extremity supported Standing balance-[name] Scale: Poor Standing balance comment: UE support on RW                            Communication    Cognition Arousal: Alert Behavior During Therapy: WFL for tasks assessed/performed   PT - Cognitive impairments: No apparent impairments  Following commands: Intact      Cueing Cueing Techniques: Verbal cues  Exercises      General Comments General comments (skin integrity, edema, etc.): toileted x 2 during session with A for hygiene and applying cream due to pt c/o soreness      Pertinent Vitals/Pain Pain Assessment Pain Assessment: Faces Faces Pain Scale: Hurts little more Pain Location: all over Pain Descriptors / Indicators: Aching, Discomfort, Grimacing, Guarding, Tender Pain Intervention(s):  Repositioned, Monitored during session, Limited activity within patient's tolerance    Home Living                          Prior Function            PT Goals (current goals can now be found in the care plan section) Progress towards PT goals: Not progressing toward goals - comment    Frequency    Min 2X/week      PT Plan      Co-evaluation              AM-PAC PT 6 Clicks Mobility   Outcome Measure  Help needed turning from your back to your side while in a flat bed without using bedrails?: A Little Help needed moving from lying on your back to sitting on the side of a flat bed without using bedrails?: A Little Help needed moving to and from a bed to a chair (including a wheelchair)?: A Little Help needed standing up from a chair using your arms (e.g., wheelchair or bedside chair)?: A Little Help needed to walk in hospital room?: A Little Help needed climbing 3-5 steps with a railing? : Total 6 Click Score: 16    End of Session Equipment Utilized During Treatment: Gait belt Activity Tolerance: Patient limited by fatigue Patient left: in bed;with call bell/phone within reach   PT Visit Diagnosis: Other abnormalities of gait and mobility (R26.89);Muscle weakness (generalized) (M62.81);Pain     Time: 8486-8456 PT Time Calculation (min) (ACUTE ONLY): 30 min  Charges:    $Gait Training: 8-22 mins $Therapeutic Activity: 8-22 mins PT General Charges $$ ACUTE PT VISIT: 1 Visit                     Tanya Hogan, PT Acute Rehabilitation Services [phone] 08/25/2024    Tanya Hogan 08/25/2024, 4:41 PM

## 2024-08-25 NOTE — Progress Notes (Addendum)
 Spoke to Ms. Tanya Hogan today and reviewed all of her history as well as her current clinical status.  She is not a candidate for lead extraction at this time.  She is extremely weak and fatigued.  She has multiple medical comorbidities which complicate her care and even if we were able to get her through the procedure I do not think she would survive the associated recovery.  She understands this and for now wants to proceed with antibiotic treatment.  I will revisit additional discussion regarding goals of care with her after I talk with her family tonight.  For now she does not want to make any changes but acknowledges that we have no other options for management of her bacteremia other than abx and supportive care. ICD therapies are still active at this time. I spoke with Dr. Waddell and Dr. Lavona both about her trajectory and unfortunate clinical status.

## 2024-08-25 NOTE — Progress Notes (Signed)
 Bilateral upper extremity venous duplex has been completed.  Results can be found in chart review under CV Proc.  08/25/2024 12:49 PM  Tamicka Shimon Elden Appl, RVT.

## 2024-08-25 NOTE — Progress Notes (Signed)
 "   Progress Note  Patient Name: Tanya Hogan Date of Encounter: 08/25/2024  Primary Cardiologist:   Redell Shallow, MD   Subjective   Events of last night noted with code stroke called although neurology is not convinced there was a stroke.    She reports that she is week and that she is having some trouble swallowing foods.   She denies chest pain or new SOB.   She thinks that the swelling in her legs and joints has gone down.   Inpatient Medications    Scheduled Meds:  amiodarone   200 mg Oral Daily   arformoterol   15 mcg Nebulization BID   aspirin   81 mg Oral Daily   budesonide  (PULMICORT ) nebulizer solution  0.25 mg Nebulization BID   Chlorhexidine  Gluconate Cloth  6 each Topical Daily   heparin   5,000 Units Subcutaneous Q8H   insulin  aspart  0-5 Units Subcutaneous QHS   insulin  aspart  0-9 Units Subcutaneous TID WC   lactobacillus  1 g Oral TID WC   levothyroxine   112 mcg Oral Daily   multivitamin with minerals  1 tablet Oral Daily   mouth rinse  15 mL Mouth Rinse 4 times per day   pantoprazole   40 mg Oral QAC breakfast   pravastatin   40 mg Oral QHS   quiNIDine sulfate   200 mg Oral TID   revefenacin   175 mcg Nebulization Daily   sodium chloride  flush  3 mL Intravenous Q12H   sodium chloride  flush  3 mL Intravenous Q12H   spironolactone   12.5 mg Oral Daily   Continuous Infusions:  DAPTOmycin  500 mg (08/24/24 1607)   PRN Meds: acetaminophen  **OR** acetaminophen , HYDROcodone -acetaminophen , levalbuterol , ondansetron  **OR** ondansetron  (ZOFRAN ) IV, mouth rinse, senna-docusate, traZODone    Vital Signs    Vitals:   08/25/24 0400 08/25/24 0500 08/25/24 0730 08/25/24 1221  BP: 99/62  119/71 (!) 120/56  Pulse: 89  89 94  Resp: 20  (!) 22 20  Temp: 97.9 F (36.6 C)   99.1 F (37.3 C)  TempSrc: Oral   Oral  SpO2: 97%  95% 92%  Weight:  81.4 kg    Height:       No intake or output data in the 24 hours ending 08/25/24 1222  Filed Weights   08/20/24 0641 08/22/24  0411 08/25/24 0500  Weight: 80.8 kg 80 kg 81.4 kg    Telemetry    AV paced with PVCs - Personally Reviewed  ECG    NA - Personally Reviewed  Physical Exam   GEN: No  acute distress.   Frail and chronically ill appearing Neck: No  JVD Cardiac: RRR, soft apical systolic murmur murmurs, rubs, or gallops.  Respiratory:      Decreased breath sounds with fine dependent crackles.  GI: Soft, nontender, non-distended, normal bowel sounds  MS:  Moderate none pitting edema; No deformity. Neuro:   Nonfocal  Psych: Oriented and appropriate    Labs    Chemistry Recent Labs  Lab 08/21/24 0400 08/22/24 0516 08/23/24 0852 08/24/24 0522  NA 134* 133* 132* 133*  K 4.1 4.3 4.6 4.8  CL 101 100 97* 99  CO2 24 25 27 26   GLUCOSE 108* 112* 120* 117*  BUN 13 9 9 12   CREATININE 0.58 0.65 0.70 0.71  CALCIUM  9.5 9.5 9.8 9.9  PROT 6.0* 5.9*  --  6.0*  ALBUMIN 2.8* 2.7*  --  2.9*  AST 114* 112*  --  83*  ALT 77* 73*  --  55*  ALKPHOS 206* 234*  --  205*  BILITOT 0.6 0.6  --  0.7  GFRNONAA >60 >60 >60 >60  ANIONGAP 9 8 8 9      Hematology Recent Labs  Lab 08/23/24 0452 08/23/24 0852 08/24/24 0522  WBC 10.2 10.6* 11.9*  RBC 4.37 4.45 4.19  HGB 13.7 13.7 12.8  HCT 41.4 41.3 38.8  MCV 94.7 92.8 92.6  MCH 31.4 30.8 30.5  MCHC 33.1 33.2 33.0  RDW 14.7 14.7 14.9  PLT 224 252 228    Cardiac EnzymesNo results for input(s): TROPONINI in the last 168 hours. No results for input(s): TROPIPOC in the last 168 hours.   BNP No results for input(s): BNP, PROBNP in the last 168 hours.    DDimer No results for input(s): DDIMER in the last 168 hours.   Radiology    CT ANGIO HEAD NECK W WO CM (CODE STROKE) Result Date: 08/24/2024 EXAM: CTA Head and Neck with Intravenous Contrast. CLINICAL HISTORY: Neuro deficit, acute, stroke suspected. TECHNIQUE: Axial CTA images of the head and neck performed with intravenous contrast. MIP reconstructed images were created and reviewed. Axial  computed tomography images of the head/brain performed without intravenous contrast. Note: Per PQRS, the description of internal carotid artery percent stenosis, including 0 percent or normal exam, is based on [place] American Symptomatic Carotid Endarterectomy Trial (NASCET) criteria. Dose reduction technique was used including one or more of the following: automated exposure control, adjustment of mA and kV according to patient size, and/or iterative reconstruction. CONTRAST: With and without. COMPARISON: CT head 08/24/2024. FINDINGS: CTA NECK: COMMON CAROTID ARTERIES: Mild atherosclerosis at the right carotid bifurcation without hemodynamically significant stenosis. Mild atherosclerosis at the left carotid bifurcation without hemodynamically significant stenosis. No dissection or occlusion. INTERNAL CAROTID ARTERIES: No stenosis by NASCET criteria. No dissection or occlusion. VERTEBRAL ARTERIES: The right vertebral artery is dominant. The vertebral artery origins are slightly obscured due to streak artifact from arthroplasty hardware as well as artifact from dense venous contrast. Visualized portions of the vertebral arteries are patent to the vertebrobasilar confluence. Mild atherosclerosis of the right V4 segment resulting in mild stenosis. No dissection or occlusion. 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 The anterior cerebral arteries are patent bilaterally and primarily supplied by the left carotid artery. No aneurysm. MIDDLE CEREBRAL ARTERIES: The middle cerebral arteries are patent bilaterally and primarily supplied by the left carotid artery. There is moderate stenosis of a proximal M2 branch of the left MCA. No aneurysm. POSTERIOR CEREBRAL ARTERIES: No significant stenosis. No occlusion. No aneurysm. BASILAR ARTERY: No significant stenosis. No occlusion. No aneurysm. OTHER: Partially visualized left chest wall pacer device  with leads noted within the right atrium and ventricle as well as within the coronary sinus. Cardiomegaly. Bilateral pulmonary opacities and bronchovascular thickening which could reflect pulmonary edema. Moderate atherosclerosis at the left subclavian artery origin without significant stenosis. SOFT TISSUES: There is a 0.9 cm nodular focus of enhancing soft tissue in the right aspect of the vallecula abutting the epiglottis and the glossoepiglottic fold. Recommend nonemergent correlation with direct visualization. No masses or lymphadenopathy. BONES: Right shoulder arthroplasty hardware is partially visualized. No acute osseous abnormality. IMPRESSION: 1. No acute large vessel occlusion. 2. Moderate stenosis of a proximal M2 branch of the left MCA. 3. Mild stenosis of the left cavernous and supraclinoid ICA. 4. Mild atherosclerosis of the right V4 segment resulting in mild stenosis. 5. 0.9 cm nodular focus of  enhancing soft tissue in the right aspect of the vallecula abutting the epiglottis and the glossoepiglottic fold. Recommend nonemergent ENT evaluation with direct visualization. Electronically signed by: Donnice Mania MD 08/24/2024 04:28 PM EST RP Workstation: [id number]   CT HEAD CODE STROKE WO CONTRAST Result Date: 08/24/2024 EXAM: CT HEAD WITHOUT CONTRAST 08/24/2024 03:45:00 PM TECHNIQUE: CT of the head was performed without the administration of intravenous contrast. Automated exposure control, iterative reconstruction, and/or weight based adjustment of the mA/kV was utilized to reduce the radiation dose to as low as reasonably achievable. COMPARISON: 08/14/2023 CLINICAL HISTORY: Neuro deficit, acute, stroke suspected. FINDINGS: BRAIN AND VENTRICLES: Mild chronic microvascular ischemic changes. Small age indeterminate lacunar infarct in the right thalamus. Atherosclerosis of the carotid siphons and intracranial right vertebral artery. No acute hemorrhage. No hydrocephalus. No extra-axial collection. No  mass effect or midline shift. ORBITS: Bilateral lens replacement. SINUSES: Postsurgical changes of the left ethmoid sinus and left aspect of the nasal cavity. Mucosal thickening throughout the paranasal sinuses with findings suggestive of left sided chronic sinusitis. SOFT TISSUES AND SKULL: No acute soft tissue abnormality. No skull fracture. [place] Stroke Program Early CT (ASPECT) score: Ganglionic (caudate, ic, lentiform nucleus, insula, M1-m3): 7 Supraganglionic (m4-m6): 3 Total: 10 IMPRESSION: 1. No acute intracranial hemorrhage. 2. ASPECT score 10. 3. Small age indeterminate lacunar infarct in the right thalamus. 4. Findings messaged to Dr. Matthews via the [facility] messaging system at 3:57 PM on 08/24/24. Electronically signed by: Donnice Mania MD 08/24/2024 03:58 PM EST RP Workstation: [id number]   US  EKG SITE RITE Result Date: 08/23/2024 If Site Rite image not attached, placement could not be confirmed due to current cardiac rhythm.   Cardiac Studies    ECHO:  08/17/24  1. Left ventricular ejection fraction, by estimation, is <20%. The left  ventricle has severely decreased function. The left ventricle demonstrates  global hypokinesis. The left ventricular internal cavity size was severely  dilated. Left ventricular  diastolic parameters are consistent with Grade III diastolic dysfunction  (restrictive).   2. Right ventricular systolic function is moderately reduced. The right  ventricular size is moderately enlarged. There is moderately elevated  pulmonary artery systolic pressure. The estimated right ventricular  systolic pressure is 48.4 mmHg.   3. Left atrial size was severely dilated.   4. Right atrial size was severely dilated.   5. A small pericardial effusion is present. The pericardial effusion is  anterior to the right ventricle. There is no evidence of cardiac  tamponade.   6. The mitral valve is normal in structure. Trivial mitral valve  regurgitation. No evidence of mitral  stenosis.   7. The aortic valve is tricuspid. There is mild calcification of the  aortic valve. Aortic valve regurgitation is not visualized. Aortic valve  sclerosis/calcification is present, without any evidence of aortic  stenosis.   8. The inferior vena cava is dilated in size with <50% respiratory  variability, suggesting right atrial pressure of 15 mmHg.    Patient Profile     73 y.o. female with a NICM/CHB, HFrEF likely in part 2/2 pacing-induced CM s/p LEFT MDT CRTP (RA/RV/CS-AIV lead) with subsequent addition of RV-LBaP lead (11/03/21) with subsequent addition of RV-d lead (05/31/24) for ATP for refractory VT, DM2, NASH cirrhosis who presented with lethargy and respiratory distress and is now found to have 4/4 cultures with MRSA.   Assessment & Plan    Bacteremia:  MRSA.  Plan is suppressive antibiotic therapy although clinically she seems to be failing this with diffuse  joint pain, rash likely vasculitis, acute neurologic event possibly related.  12/29 cultures are 1 out of 4 positive. Repeat BC were drawn.    Dr. Almetta will comment further and I think we need to make a definitive statement one way or another about lead removal which would require TEE with the procedure being high risk vs an attempt at continued sterilization with palliative care and possible hospice care if this fails.    Non ischemic cardiomyopathy:  Unable to titrate meds secondary to hypotension.    VT:  No sustained arrhythmias.  Continue suppressive treatment with amio and quinidine.     Petechiae:   I would be inclined to think that it is related to the infection itself and possibly a concurrent vasculitis.   Platelets are OK.    For questions or updates, please contact [facility] Please consult [email or web address] for contact info under Cardiology/STEMI.   Signed, Lynwood Schilling, MD  08/25/2024, 12:22 PM    "

## 2024-08-25 NOTE — Progress Notes (Signed)
 Will come by and see Tanya Hogan today after clinic. 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 At this point it doesn't look like suppressive abx are likely to allow her to discharge. Will talk with her today and whether high risk lead extraction is within her GOC at this point. Will also need to discuss with ID as well. If we proceeded then would have in lab TEE at time of extraction as she wouldn't be a sternotomy candidate either way if she has vegetations on her valves.

## 2024-08-25 NOTE — Progress Notes (Signed)
 "                                                                                                                                                                                                       Daily Progress Note   Patient Name: Tanya Hogan       Date: 08/25/2024 DOB: Sep 13, 1950  Age: 74 y.o. MRN#: [id number] Attending Physician: Trixie Nilda HERO, MD Primary Care Physician: [name], MD Admit Date: 08/16/2024  Reason for Consultation/Follow-up: Establishing goals of care  Subjective: Medical records reviewed including progress notes, labs, imaging. Patient assessed at the bedside.  She tells me she is feeling better than yesterday, though still not good.  No visitors were present.  She was requesting BiPAP.  Discussed with RN.  Created space and opportunity for patient's thoughts and feelings on her current illness, particularly last night events and code stroke.  Reviewed that cardiology is concerned that her stroke symptoms may indicate suppressive antibiotic therapy is not sufficient and she may still need to consider extraction of her device.  She does not recall what cardiology has said to her thus far today.  Also reviewed ID's plan to redraw blood cultures today and the best case scenario that the delayed growth of her most recent blood culture may show decreased burden of infection. No other questions at this time.  Discussed with primary MD.  Called patient's daughter Tanya Hogan to review this week's hospitalization. She understands overall situation.  She inquired about status of advanced directives, which she has help patient fill out and are at the bedside.  We reviewed her previous conversations with cardiology and her thoughts on patient's options, lead extraction etc.  Her current feeling is that patient will want to do lead extraction if suppressive antibiotics are not working, doing everything she can to stay alive.  At the same time, she understands the medical  team is trying to keep patient comfortable through this process did not cause greater harm than benefit.  She had a good conversation with Dr. Almetta in the past and will be available for a phone call when he speaks with patient today.  Sent a request via secure chat for inclusion of family and today's discussion.  Questions and concerns addressed. PMT will continue to support holistically.   Length of Stay: 9   Physical Exam Vitals and nursing note reviewed.  Constitutional:      General: She is not in acute distress.    Appearance: She is ill-appearing.  HENT:     Head: Normocephalic and atraumatic.  Cardiovascular:     Rate and Rhythm: Normal rate.  Pulmonary:     Effort: Pulmonary effort is normal.  Musculoskeletal:     Left hand: Swelling present.  Skin:    General: Skin is dry.     Findings: Petechiae present.     Comments: Diffuse rash over bilateral feet  Neurological:     Mental Status: She is alert. Mental status is at baseline.  Psychiatric:        Mood and Affect: Affect is labile.        Behavior: Behavior normal.        Cognition and Memory: Cognition normal.            Vital Signs: BP 119/71 (BP Location: Left Arm)   Pulse 89   Temp 97.9 F (36.6 C) (Oral)   Resp (!) 22   Ht 5' 1 (1.549 m)   Wt 81.4 kg   SpO2 95%   BMI 33.91 kg/m  SpO2: SpO2: 95 % O2 Device: O2 Device: Room Air O2 Flow Rate: O2 Flow Rate (L/min): 2 L/min  Palliative Care Assessment & Plan   Patient Profile: 74 y.o. female  with past medical history of recent revision of pacemaker/AICD, breast cancer, end stage chronic heart failure, nonalcoholic cirrhosis, BiV pacemake, depression, DM2, GERD, HTN, hypothyroidism, valvular heart disease, pulmonary hypertension, sleep apnea and ventricular tachy arrhythmia  admitted on 08/16/2024 with swelling and difficulty breathing.    Admitted for acute on chronic HF. On 12/25 she became minimally responsive requiring BiPAP. She was found to  have MRSA bacteremia.   She has six inpatient hospitalizations in the last six months.   Recommendations/Plan: Continue DNR/DNI Continue current care plan Requested patient's daughter be included in discussions with Dr. Almetta regarding high risk lead extraction given that bacteremia is persistent with suppressive antibiotics and patient is now showing concerning neurological symptoms Ongoing GOC discussions Continued PMT support    Care plan was discussed with patient, bedside RN, primary MD, patient's daughter, EP physician   Mickle Fell, PA-C Palliative Medicine Team Team phone # [phone]  Thank you for allowing the Palliative Medicine Team to assist in the care of this patient. Please utilize secure chat with additional questions, if there is no response within 30 minutes please call the above phone number.  Palliative Medicine Team providers are available by phone from 7am to 7pm daily and can be reached through the team cell phone.  Should this patient require assistance outside of these hours, please call the patient's attending physician.   Time Total: 50  Visit consisted of counseling and education dealing with the complex and emotionally intense issues of symptom management and palliative care in the setting of serious and potentially life-threatening illness. Greater than 50% of this time was spent counseling and coordinating care related to the above assessment and plan.  Personally spent 50 minutes in patient care including extensive chart review (labs, imaging, progress/consult notes, vital signs), medically appropraite exam, discussed with treatment team, education to patient, family, and staff, documenting clinical information, medication review and management, coordination of care, and available advanced directive documents.    "

## 2024-08-25 NOTE — Progress Notes (Signed)
 Mobility Specialist Progress Note;   08/25/24 1200  Mobility  Activity Pivoted/transferred to/from BSC  Level of Assistance Moderate assist, patient does 50-74%  Assistive Device Front wheel [place]  Distance Ambulated (ft) 3 ft  Activity Response Tolerated fair  Mobility Referral Yes  Mobility visit 1 Mobility  Mobility Specialist Start Time (ACUTE ONLY) 1200  Mobility Specialist Stop Time (ACUTE ONLY) 1215  Mobility Specialist Time Calculation (min) (ACUTE ONLY) 15 min   Answered pts call light requesting assistance to [facility]. Required ModA for bed mobility w/ use of bed pads, ModA to stand, and MinA to safely transfer to and from [facility]. BM successful. Pt w/ cough throughout session, spitting up. Further mobility deferred. Pt returned to bed and left with all needs met. RN present.   Lauraine Erm Mobility Specialist Please contact via SecureChat or [employer] [phone]

## 2024-08-25 NOTE — Progress Notes (Signed)
 PHARMACY - ANTICOAGULATION CONSULT NOTE  Pharmacy Consult for Heparin  Indication: DVT, bilateral upper extremities  Allergies[1]  Patient Measurements: Height: 5' 1 (154.9 cm) Weight: 81.4 kg (179 lb 7.3 oz) IBW/kg (Calculated) : 47.8 HEPARIN  DW (KG): 65.4  Vital Signs: Temp: 98 F (36.7 C) (01/02 1939) Temp Source: Oral (01/02 1939) BP: 107/66 (01/02 1939) Pulse Rate: 89 (01/02 1939)  Labs: Recent Labs    08/23/24 0452 08/23/24 0852 08/24/24 0522 08/25/24 2302  HGB 13.7 13.7 12.8  --   HCT 41.4 41.3 38.8  --   PLT 224 252 228  --   HEPARINUNFRC  --   --   --  0.14*  CREATININE  --  0.70 0.71  --   CKTOTAL  --  123  --   --     Estimated Creatinine Clearance: 58.7 mL/min (by C-G formula based on SCr of 0.71 mg/dL).   Medical History: Past Medical History:  Diagnosis Date   Alkaline phosphatase elevation    Asthma    Breast cancer (HCC) 2011   Carpal tunnel syndrome    bilateral   Chronic HFrEF (heart failure with reduced ejection fraction) (HCC)    Cirrhosis, non-alcoholic (HCC)    Closed fracture of unspecified part of radius (alone)    DDD (degenerative disc disease), lumbar    Depression    pt denies   Diabetes mellitus without complication (HCC)    Enlarged heart    Fibromyalgia    GERD (gastroesophageal reflux disease)    History of kidney stones    HTN (hypertension)    Hypothyroidism    IBS (irritable bowel syndrome)    LBBB (left bundle branch block)    Microcytic anemia 05/14/2017   Mitral regurgitation    NICM (nonischemic cardiomyopathy) (HCC)    Pneumonia    Presence of permanent cardiac pacemaker    Pulmonary hypertension (HCC)    PVD (peripheral vascular disease)    2019   Sleep apnea    uses cpap   Tricuspid regurgitation    Ventricular tachyarrhythmia [facility])    Assessment: 74 yr old female to begin IV heparin  for bilateral upper extremity DVT per duplex. Has been on Heparin  5000 units SQ Q8hrs for VTE prophylaxis.  Last dose  0522am. Acute generalized weakness on 1/1 and Code Stroke called. CT of head showed small age indeterminate lacunar infarct in right thalamus. Noted Neuro/Stroke team dose note feel this was a stroke and plan repeat CT scan in 24 hrs.  Heparin  level this evening is below goal at 0.14.  No overt bleeding or complications noted.  CBC stable.  Goal of Therapy:  Heparin  level 0.3-0.5 units/ml Monitor platelets by anticoagulation protocol: Yes   Plan:  Increase IV heparin  gtt to 1050 units/hr. Repeat heparin  level in 8 hrs Daily heparin  level and CBC.  Harlene Barlow, [employer], BCCP Clinical Pharmacist  08/25/2024 11:53 PM   [facility] pharmacy phone numbers are listed on [email or web address]        [1]  Allergies Allergen Reactions   Injectafer [Ferric Carboxymaltose] Shortness Of Breath    Mild SOB following injectafer infusion    Penicillins Anaphylaxis and Rash    Tolerated ceftin  2020   Accupril [Quinapril Hcl] Other (See Comments)    Hyperkalemia=high potassium level   Celebrex [Celecoxib] Other (See Comments)    Hx hematemesis, bleeding ulcer   Cozaar  [Losartan  Potassium] Itching   Dml Forte Rash   Entresto [Sacubitril-Valsartan] Other (See Comments)    Hyperkalemia  Flonase  [Fluticasone ] Other (See Comments)    Hx nasal ulcer   Lactose Intolerance (Gi) Diarrhea   Lipitor [Atorvastatin ] Other (See Comments)    Myalgias    Nasonex  [Mometasone  Furoate] Other (See Comments)    Epistaxis    Nsaids Other (See Comments)    Hx hematemesis, bleeding ulcer   Advil [Ibuprofen] Nausea And Vomiting    Hx hematemesis, bleeding ulcer   Baking Soda-Fluoride [Sodium Fluoride] Itching and Rash   Bayer Aspirin  [Aspirin ] Other (See Comments)    Hx hematemesis, bleeding ulcer   Chamomile Hives   Clindamycin /Lincomycin Rash   Codeine Nausea And Vomiting   Lasix  [Furosemide ] Other (See Comments)    Tinnitus  Hypotension   Miralax  [Polyethylene Glycol (Macrogol)] Other (See Comments)     Leaky gut syndrome   Quinine Rash   Reclast  [Zoledronic  Acid] Rash and Other (See Comments)    Tremors    Rifadin [Rifampin] Other (See Comments)    Unknown reaction   Singulair  [Montelukast ] Other (See Comments)    Dizziness    Sulfa Antibiotics Rash   Ultram  [Tramadol ] Anxiety and Rash   Vesicare  [Solifenacin ] Other (See Comments)    Urinary retention   Wellbutrin  [Bupropion ] Other (See Comments)    Myalgias  Skin crawling sensation   Wool Alcohol  [Lanolin] Rash

## 2024-08-25 NOTE — Progress Notes (Addendum)
 "        [facility]  Date of Admission:  08/16/2024      Total days of antibiotics 9   Daptomycin           ASSESSMENT: Tanya Hogan is a 74 y.o. female admitted with:   MRSA Bacteremia -  PPM in Situ -  ?code stroke o/n with increase weakness suddenly -  BCx (+) 12/25 and 12/27. Unfortunately now growing again from 12/29 (grew on day 4 in 1 bottle). She is not able to undergo TEE due to overall medical complexity and frailty.  She is also not an extraction candidate for the same reasons.   We have not been able to clear her bacteremia with effective standard of care antibiotics. MIC for daptomycin  is pending. TMax 99.1 but never with fevers. She did have delay in growth this last culture with time to positive culture closer to 4 days and only in 1 bottle.  Neurology does not think the findings on CT of brain reflect acute emboli.  - continue daptomycin   - there are not any other antibiotic changes to make here; unfortunately treating infected metal is sometimes unsuccessful without removal.  - seems to have a lower burden with 12/29 cultures - repeat today and continue to follow for clearance.  - Cancelled PICC line  LCV Rash -  Nonblanching petechial/purpuric rash to lower extremities arms and abdomen/back characteristic of leukocytoclastic vasculitis which is immune complex phenomenon in the setting of staphylococcus aureus bacteremia. Fortunately there is no signs of secondary glomerulonephritis.  If there is any concerns we could biopsy in the outpatient setting if other differentials should be considered  - treating the bacteremia is primary recommendation here.  - rash appears improved today. Nothing advancing and seems to be receeding now.   Oral Thrush - Described poor appetitie and intake 2/2 oral pain and tongue soreness. On exam she has white patches noted and glossitis findings. Will start nystatin  swish for her > fluconazole  given  antiarrhythmics she is maintained on.  Isolation Recommendations -  Contact with MRSA infection   PLAN: Continue daptomycin  IV daily  FU MIC that is pending in [facility]  Redraw blood cultures today (hopefully we can take away the 4-d delay in growth and 1/4 bottles to be a sign she is starting to clear).  Supportive care for rash  Hold off on picc line until finalized sterile cultures  DW EP Nystatin  swishes QID    Principal Problem:   MRSA bacteremia Active Problems:   History of left breast cancer   Hypothyroidism   Type 2 diabetes mellitus with hyperlipidemia (HCC)   Essential hypertension   NASH (nonalcoholic steatohepatitis)   Microcytic anemia   Irritable bowel syndrome with constipation   Ventricular tachycardia, unspecified (HCC)   Gastroesophageal reflux disease   COPD (chronic obstructive pulmonary disease) (HCC)   Obesity, Class I, BMI 30-34.9   Acute on chronic systolic CHF (congestive heart failure) (HCC)   Hyponatremia    amiodarone   200 mg Oral Daily   arformoterol   15 mcg Nebulization BID   aspirin   81 mg Oral Daily   budesonide  (PULMICORT ) nebulizer solution  0.25 mg Nebulization BID   Chlorhexidine  Gluconate Cloth  6 each Topical Daily   heparin   5,000 Units Subcutaneous Q8H   insulin  aspart  0-5 Units Subcutaneous QHS   insulin  aspart  0-9 Units Subcutaneous TID WC   lactobacillus  1 g Oral TID WC   levothyroxine   112 mcg Oral Daily   multivitamin with minerals  1 tablet Oral Daily   mouth rinse  15 mL Mouth Rinse 4 times per day   pantoprazole   40 mg Oral QAC breakfast   pravastatin   40 mg Oral QHS   quiNIDine sulfate   200 mg Oral TID   revefenacin   175 mcg Nebulization Daily   sodium chloride  flush  3 mL Intravenous Q12H   sodium chloride  flush  3 mL Intravenous Q12H   spironolactone   12.5 mg Oral Daily    SUBJECTIVE: Feeling very week. Discussed events o/n     Review of Systems: Review of Systems  Constitutional:  Negative for chills  and fever.  Cardiovascular:  Positive for leg swelling. Negative for chest pain and palpitations.  Gastrointestinal:  Negative for abdominal pain, diarrhea and vomiting.  Genitourinary: Negative.   Skin:  Positive for rash (tender rash on legs, dependent areas of arms).  Neurological:  Negative for dizziness and headaches.    Allergies[1]  OBJECTIVE: Vitals:   08/25/24 0400 08/25/24 0500 08/25/24 0730 08/25/24 1221  BP: 99/62  119/71 (!) 120/56  Pulse: 89  89 94  Resp: 20  (!) 22 20  Temp: 97.9 F (36.6 C)   99.1 F (37.3 C)  TempSrc: Oral   Oral  SpO2: 97%  95% 92%  Weight:  81.4 kg    Height:       Body mass index is 33.91 kg/m.  Physical Exam Vitals reviewed.  Constitutional:      Appearance: She is well-developed. She is ill-appearing.  Cardiovascular:     Rate and Rhythm: Normal rate and regular rhythm.     Heart sounds: No murmur heard. Pulmonary:     Effort: Pulmonary effort is normal.  Musculoskeletal:     Right lower leg: Tenderness present. No edema.     Left lower leg: Tenderness present.  Skin:    Comments: Rash is not as violaceous today and appears to be fading in places.   Neurological:     Mental Status: She is alert.       Lab Results Lab Results  Component Value Date   WBC 11.9 (H) 08/24/2024   HGB 12.8 08/24/2024   HCT 38.8 08/24/2024   MCV 92.6 08/24/2024   PLT 228 08/24/2024    Lab Results  Component Value Date   CREATININE 0.71 08/24/2024   BUN 12 08/24/2024   NA 133 (L) 08/24/2024   K 4.8 08/24/2024   CL 99 08/24/2024   CO2 26 08/24/2024    Lab Results  Component Value Date   ALT 55 (H) 08/24/2024   AST 83 (H) 08/24/2024   ALKPHOS 205 (H) 08/24/2024   BILITOT 0.7 08/24/2024     Microbiology: Recent Results (from the past 240 hours)  Resp panel by RT-PCR (RSV, Flu A&B, Covid) Anterior Nasal Swab     Status: None   Collection Time: 08/16/24  8:19 AM   Specimen: Anterior Nasal Swab  Result Value Ref Range Status    SARS Coronavirus 2 by RT PCR NEGATIVE NEGATIVE Final   Influenza A by PCR NEGATIVE NEGATIVE Final   Influenza B by PCR NEGATIVE NEGATIVE Final    Comment: (NOTE) The Xpert Xpress SARS-CoV-2/FLU/RSV plus assay is intended as an aid in the diagnosis of influenza from Nasopharyngeal swab specimens and should not be used as a sole basis for treatment. Nasal washings and aspirates are unacceptable for Xpert Xpress SARS-CoV-2/FLU/RSV testing.  Fact Sheet for Patients: bloggercourse.com  Fact Sheet for Healthcare Providers: seriousbroker.it  This test is not yet approved or cleared by the [place]  FDA and has been authorized for detection and/or diagnosis of SARS-CoV-2 by FDA under an Emergency Use Authorization (EUA). This EUA will remain in effect (meaning this test can be used) for the duration of the COVID-19 declaration under Section 564(b)(1) of the Act, 21 U.S.C. section 360bbb-3(b)(1), unless the authorization is terminated or revoked.     Resp Syncytial Virus by PCR NEGATIVE NEGATIVE Final    Comment: (NOTE) Fact Sheet for Patients: bloggercourse.com  Fact Sheet for Healthcare Providers: seriousbroker.it  This test is not yet approved or cleared by the [place]  FDA and has been authorized for detection and/or diagnosis of SARS-CoV-2 by FDA under an Emergency Use Authorization (EUA). This EUA will remain in effect (meaning this test can be used) for the duration of the COVID-19 declaration under Section 564(b)(1) of the Act, 21 U.S.C. section 360bbb-3(b)(1), unless the authorization is terminated or revoked.  Performed at [facility], [address] [place], [place] [address]   MRSA Next Gen by PCR, Nasal     Status: Abnormal   Collection Time: 08/17/24  4:51 PM   Specimen: Nasal Mucosa; Nasal Swab  Result Value Ref Range Status   MRSA by PCR Next Gen  DETECTED (A) NOT DETECTED Final    Comment: RESULT CALLED TO, READ BACK BY AND VERIFIED WITH: L BROWN RN 08/17/2024 @ 2142 BY AB (NOTE) The GeneXpert MRSA Assay (FDA approved for NASAL specimens only), is one component of a comprehensive MRSA colonization surveillance program. It is not intended to diagnose MRSA infection nor to guide or monitor treatment for MRSA infections. Test performance is not FDA approved in patients less than 46 years old. Performed at [facility], [address] [place], [place] [address]   Culture, blood (Routine X 2) w Reflex to ID Panel     Status: Abnormal   Collection Time: 08/17/24  5:58 PM   Specimen: BLOOD LEFT HAND  Result Value Ref Range Status   Specimen Description BLOOD LEFT HAND  Final   Special Requests   Final    BOTTLES DRAWN AEROBIC AND ANAEROBIC Blood Culture adequate volume   Culture  Setup Time   Final    GRAM POSITIVE COCCI IN BOTH AEROBIC AND ANAEROBIC BOTTLES CRITICAL RESULT CALLED TO, READ BACK BY AND VERIFIED WITH: PHARMD J.FRENS AT 0802 ON 08/18/2024 BY T.SAAD.    Culture (A)  Final    METHICILLIN RESISTANT STAPHYLOCOCCUS AUREUS Sent to [facility] for further susceptibility testing. Performed at [facility], [address], [place], [place] [address]    Report Status 08/21/2024 FINAL  Final   Organism ID, Bacteria METHICILLIN RESISTANT STAPHYLOCOCCUS AUREUS  Final      Susceptibility   Methicillin resistant staphylococcus aureus - MIC*    CIPROFLOXACIN  >=8 RESISTANT Resistant     ERYTHROMYCIN  >=8 RESISTANT Resistant     GENTAMICIN  <=0.5 SENSITIVE Sensitive     OXACILLIN >=4 RESISTANT Resistant     TETRACYCLINE <=1 SENSITIVE Sensitive     VANCOMYCIN  1 SENSITIVE Sensitive     TRIMETH/SULFA <=10 SENSITIVE Sensitive     CLINDAMYCIN  <=0.25 SENSITIVE Sensitive     RIFAMPIN <=0.5 SENSITIVE Sensitive     Inducible Clindamycin  NEGATIVE Sensitive     LINEZOLID 2 SENSITIVE Sensitive     * METHICILLIN RESISTANT  STAPHYLOCOCCUS AUREUS  Blood Culture ID Panel (Reflexed)     Status: Abnormal  Collection Time: 08/17/24  5:58 PM  Result Value Ref Range Status   Enterococcus faecalis NOT DETECTED NOT DETECTED Final   Enterococcus Faecium NOT DETECTED NOT DETECTED Final   Listeria monocytogenes NOT DETECTED NOT DETECTED Final   Staphylococcus species DETECTED (A) NOT DETECTED Final    Comment: CRITICAL RESULT CALLED TO, READ BACK BY AND VERIFIED WITH: PHARMD J.FRENS AT 0802 ON 08/18/2024 BY T.SAAD.    Staphylococcus aureus (BCID) DETECTED (A) NOT DETECTED Final    Comment: Methicillin (oxacillin)-resistant Staphylococcus aureus (MRSA). MRSA is predictably resistant to beta-lactam antibiotics (except ceftaroline). Preferred therapy is vancomycin  unless clinically contraindicated. Patient requires contact precautions if  hospitalized. CRITICAL RESULT CALLED TO, READ BACK BY AND VERIFIED WITH: PHARMD J.FRENS AT 0802 ON 08/18/2024 BY T.SAAD.    Staphylococcus epidermidis NOT DETECTED NOT DETECTED Final   Staphylococcus lugdunensis NOT DETECTED NOT DETECTED Final   Streptococcus species NOT DETECTED NOT DETECTED Final   Streptococcus agalactiae NOT DETECTED NOT DETECTED Final   Streptococcus pneumoniae NOT DETECTED NOT DETECTED Final   Streptococcus pyogenes NOT DETECTED NOT DETECTED Final   A.calcoaceticus-baumannii NOT DETECTED NOT DETECTED Final   Bacteroides fragilis NOT DETECTED NOT DETECTED Final   Enterobacterales NOT DETECTED NOT DETECTED Final   Enterobacter cloacae complex NOT DETECTED NOT DETECTED Final   Escherichia coli NOT DETECTED NOT DETECTED Final   Klebsiella aerogenes NOT DETECTED NOT DETECTED Final   Klebsiella oxytoca NOT DETECTED NOT DETECTED Final   Klebsiella pneumoniae NOT DETECTED NOT DETECTED Final   Proteus species NOT DETECTED NOT DETECTED Final   Salmonella species NOT DETECTED NOT DETECTED Final   Serratia marcescens NOT DETECTED NOT DETECTED Final   Haemophilus  influenzae NOT DETECTED NOT DETECTED Final   Neisseria meningitidis NOT DETECTED NOT DETECTED Final   Pseudomonas aeruginosa NOT DETECTED NOT DETECTED Final   Stenotrophomonas maltophilia NOT DETECTED NOT DETECTED Final   Candida albicans NOT DETECTED NOT DETECTED Final   Candida auris NOT DETECTED NOT DETECTED Final   Candida glabrata NOT DETECTED NOT DETECTED Final   Candida krusei NOT DETECTED NOT DETECTED Final   Candida parapsilosis NOT DETECTED NOT DETECTED Final   Candida tropicalis NOT DETECTED NOT DETECTED Final   Cryptococcus neoformans/gattii NOT DETECTED NOT DETECTED Final   Meth resistant mecA/C and MREJ DETECTED (A) NOT DETECTED Final    Comment: CRITICAL RESULT CALLED TO, READ BACK BY AND VERIFIED WITH: PHARMD J.FRENS AT 0802 ON 08/18/2024 BY T.SAAD. Performed at [facility], [address], [place], [place] [address]   MIC (1 Drug)-     Status: Abnormal   Collection Time: 08/17/24  5:58 PM  Result Value Ref Range Status   Min Inhibitory Conc (1 Drug) Preliminary report (A)  Final    Comment: (NOTE) Performed At: [facility] [address] [place], [place] [id number] Jennette Shorter MD Ey:[id number]    Source LAB [phone] MRSA DAPTO BLOOS  Corrected    Comment: Performed at [facility], [address] [place], [place] [address] CORRECTED ON 12/30 AT 0800: PREVIOUSLY REPORTED AS BLOOD   MIC Result     Status: Abnormal   Collection Time: 08/17/24  5:58 PM  Result Value Ref Range Status   Result 1 (MIC) Comment (A)  Final    Comment: (NOTE) Methicillin - resistant Staphylococcus aureus Identification performed by account, not confirmed by this laboratory. DAPTOMYCIN  Performed At: [facility] [address] [place], [place] [id number] Jennette Shorter MD Ey:[id number]   Culture, blood (Routine X 2)  w Reflex to ID Panel     Status: Abnormal   Collection Time: 08/17/24  6:01 PM   Specimen: BLOOD RIGHT HAND  Result Value Ref Range Status    Specimen Description BLOOD RIGHT HAND  Final   Special Requests   Final    BOTTLES DRAWN AEROBIC AND ANAEROBIC Blood Culture results may not be optimal due to an inadequate volume of blood received in culture bottles   Culture  Setup Time   Final    GRAM POSITIVE COCCI IN BOTH AEROBIC AND ANAEROBIC BOTTLES CRITICAL VALUE NOTED.  VALUE IS CONSISTENT WITH PREVIOUSLY REPORTED AND CALLED VALUE.    Culture (A)  Final    STAPHYLOCOCCUS AUREUS SUSCEPTIBILITIES PERFORMED ON PREVIOUS CULTURE WITHIN THE LAST 5 DAYS. Performed at [facility], [address], [place], [place] [address]    Report Status 08/21/2024 FINAL  Final  Culture, blood (Routine X 2) w Reflex to ID Panel     Status: Abnormal   Collection Time: 08/19/24 11:15 AM   Specimen: BLOOD  Result Value Ref Range Status   Specimen Description BLOOD BLOOD RIGHT ARM  Final   Special Requests   Final    BOTTLES DRAWN AEROBIC AND ANAEROBIC Blood Culture results may not be optimal due to an inadequate volume of blood received in culture bottles   Culture  Setup Time   Final    GRAM POSITIVE COCCI IN CLUSTERS IN BOTH AEROBIC AND ANAEROBIC BOTTLES Gram Stain Report Called to,Read Back By and Verified With: PHARMD G ABBOTT 08/20/2024 @ 0552 BY AB CRITICAL VALUE NOTED.  VALUE IS CONSISTENT WITH PREVIOUSLY REPORTED AND CALLED VALUE.    Culture (A)  Final    STAPHYLOCOCCUS AUREUS SUSCEPTIBILITIES PERFORMED ON PREVIOUS CULTURE WITHIN THE LAST 5 DAYS. Performed at [facility], [address], [place], [place] [address]    Report Status 08/22/2024 FINAL  Final  Culture, blood (Routine X 2) w Reflex to ID Panel     Status: Abnormal   Collection Time: 08/19/24 11:15 AM   Specimen: BLOOD LEFT ARM  Result Value Ref Range Status   Specimen Description BLOOD LEFT ARM  Final   Special Requests   Final    BOTTLES DRAWN AEROBIC ONLY Blood Culture results may not be optimal due to an inadequate volume of blood received in culture  bottles   Culture  Setup Time   Final    GRAM POSITIVE COCCI IN CLUSTERS AEROBIC BOTTLE ONLY CRITICAL VALUE NOTED.  VALUE IS CONSISTENT WITH PREVIOUSLY REPORTED AND CALLED VALUE.    Culture (A)  Final    STAPHYLOCOCCUS AUREUS SUSCEPTIBILITIES PERFORMED ON PREVIOUS CULTURE WITHIN THE LAST 5 DAYS. Performed at [facility], [address], [place], [place] [address]    Report Status 08/22/2024 FINAL  Final  Culture, blood (Routine X 2) w Reflex to ID Panel     Status: Abnormal (Preliminary result)   Collection Time: 08/21/24 10:46 AM   Specimen: BLOOD  Result Value Ref Range Status   Specimen Description BLOOD BLOOD RIGHT ARM  Final   Special Requests   Final    BOTTLES DRAWN AEROBIC AND ANAEROBIC Blood Culture adequate volume   Culture  Setup Time   Final    GRAM POSITIVE COCCI ANAEROBIC BOTTLE ONLY CRITICAL VALUE NOTED.  VALUE IS CONSISTENT WITH PREVIOUSLY REPORTED AND CALLED VALUE.    Culture (A)  Final    STAPHYLOCOCCUS AUREUS SUSCEPTIBILITIES PERFORMED ON PREVIOUS CULTURE WITHIN THE LAST 5 DAYS.  Performed at [facility], [address], [place], [place] [address]    Report Status PENDING  Incomplete  Culture, blood (Routine X 2) w Reflex to ID Panel     Status: None (Preliminary result)   Collection Time: 08/21/24 10:46 AM   Specimen: BLOOD  Result Value Ref Range Status   Specimen Description BLOOD BLOOD LEFT HAND  Final   Special Requests   Final    BOTTLES DRAWN AEROBIC AND ANAEROBIC Blood Culture results may not be optimal due to an inadequate volume of blood received in culture bottles   Culture   Final    NO GROWTH 4 DAYS Performed at [facility], [address], [place], [place] [address]    Report Status PENDING  Incomplete     Corean Fireman, MSN, NP-C [facility]  [email or web address] Pager: [phone] Office: [phone] [facility] Main Line: [phone] *Secure Chat  Communication Welcome        [1]  Allergies Allergen Reactions   Injectafer [Ferric Carboxymaltose] Shortness Of Breath    Mild SOB following injectafer infusion    Penicillins Anaphylaxis and Rash    Tolerated ceftin  2020   Accupril [Quinapril Hcl] Other (See Comments)    Hyperkalemia=high potassium level   Celebrex [Celecoxib] Other (See Comments)    Hx hematemesis, bleeding ulcer   Cozaar  [Losartan  Potassium] Itching   Dml Forte Rash   Entresto [Sacubitril-Valsartan] Other (See Comments)    Hyperkalemia   Flonase  [Fluticasone ] Other (See Comments)    Hx nasal ulcer   Lactose Intolerance (Gi) Diarrhea   Lipitor [Atorvastatin ] Other (See Comments)    Myalgias    Nasonex  [Mometasone  Furoate] Other (See Comments)    Epistaxis    Nsaids Other (See Comments)    Hx hematemesis, bleeding ulcer   Advil [Ibuprofen] Nausea And Vomiting    Hx hematemesis, bleeding ulcer   Baking Soda-Fluoride [Sodium Fluoride] Itching and Rash   Bayer Aspirin  [Aspirin ] Other (See Comments)    Hx hematemesis, bleeding ulcer   Chamomile Hives   Clindamycin /Lincomycin Rash   Codeine Nausea And Vomiting   Lasix  [Furosemide ] Other (See Comments)    Tinnitus  Hypotension   Miralax  [Polyethylene Glycol (Macrogol)] Other (See Comments)    Leaky gut syndrome   Quinine Rash   Reclast  [Zoledronic  Acid] Rash and Other (See Comments)    Tremors    Rifadin [Rifampin] Other (See Comments)    Unknown reaction   Singulair  [Montelukast ] Other (See Comments)    Dizziness    Sulfa Antibiotics Rash   Ultram  [Tramadol ] Anxiety and Rash   Vesicare  [Solifenacin ] Other (See Comments)    Urinary retention   Wellbutrin  [Bupropion ] Other (See Comments)    Myalgias  Skin crawling sensation   Wool Alcohol  [Lanolin] Rash   "

## 2024-08-25 NOTE — Progress Notes (Addendum)
 STROKE TEAM PROGRESS NOTE    SIGNIFICANT HOSPITAL EVENTS  1/1: Inpatient code stroke called due to patient not following commands, left facial droop, generalized weakness, dysarthria.  CT head CTA negative for acute abnormality.  Unable to get MRI due to noncompatible pacemaker.  INTERIM HISTORY/SUBJECTIVE  1/2: Repeat CT again shows age-indeterminate infarct right thalamus, small remote lacunar infarct left corona radiata and small vessel disease.  When asked about yesterday's episode, patient states she remembers sitting up in chair, then couldn't move anything, still with generalized weakness on exam this morning. Says the only thing she could say yesterday during this episode was I can't, no speech difficulties on exam today. Endorses intense pain during episode, improved pain today--worse on rash.  She moves all extremities with some tremors.  Vasculitis rash from mrsa bacteramia noted.  Attending at bedside, plan discussed.   OBJECTIVE  CBC    Component Value Date/Time   WBC 11.9 (H) 08/24/2024 0522   RBC 4.19 08/24/2024 0522   HGB 12.8 08/24/2024 0522   HGB 13.9 03/29/2023 1454   HGB 12.5 10/30/2014 1118   HCT 38.8 08/24/2024 0522   HCT 41.3 03/29/2023 1454   HCT 39.9 10/30/2014 1118   PLT 228 08/24/2024 0522   PLT 265 03/29/2023 1454   MCV 92.6 08/24/2024 0522   MCV 96 03/29/2023 1454   MCV 90.5 10/30/2014 1118   MCH 30.5 08/24/2024 0522   MCHC 33.0 08/24/2024 0522   RDW 14.9 08/24/2024 0522   RDW 12.4 03/29/2023 1454   RDW 14.9 (H) 10/30/2014 1118   LYMPHSABS 1.3 08/23/2024 0852   LYMPHSABS 1.5 10/24/2021 1045   LYMPHSABS 1.8 10/30/2014 1118   MONOABS 0.5 08/23/2024 0852   MONOABS 0.3 10/30/2014 1118   EOSABS 0.1 08/23/2024 0852   EOSABS 0.1 10/24/2021 1045   BASOSABS 0.1 08/23/2024 0852   BASOSABS 0.0 10/24/2021 1045   BASOSABS 0.0 10/30/2014 1118    BMET    Component Value Date/Time   NA 133 (L) 08/24/2024 0522   NA 132 (L) 08/14/2024 1542   NA  142 10/30/2014 1119   K 4.8 08/24/2024 0522   K 4.5 10/30/2014 1119   CL 99 08/24/2024 0522   CL 106 09/23/2012 0947   CO2 26 08/24/2024 0522   CO2 25 10/30/2014 1119   GLUCOSE 117 (H) 08/24/2024 0522   GLUCOSE 204 (H) 10/30/2014 1119   GLUCOSE 163 (H) 09/23/2012 0947   BUN 12 08/24/2024 0522   BUN 12 08/14/2024 1542   BUN 11.8 10/30/2014 1119   CREATININE 0.71 08/24/2024 0522   CREATININE 0.92 11/04/2022 1412   CREATININE 0.8 10/30/2014 1119   CALCIUM  9.9 08/24/2024 0522   CALCIUM  9.1 10/30/2014 1119   EGFR 71 08/14/2024 1542   GFRNONAA >60 08/24/2024 0522   GFRNONAA 66 03/26/2020 0755    IMAGING past 24 hours CT ANGIO HEAD NECK W WO CM (CODE STROKE) Result Date: 08/24/2024 EXAM: CTA Head and Neck with Intravenous Contrast. CLINICAL HISTORY: Neuro deficit, acute, stroke suspected. TECHNIQUE: Axial CTA images of the head and neck performed with intravenous contrast. MIP reconstructed images were created and reviewed. Axial computed tomography images of the head/brain performed without intravenous contrast. Note: Per PQRS, the description of internal carotid artery percent stenosis, including 0 percent or normal exam, is based on [employer] Symptomatic Carotid Endarterectomy Trial (NASCET) criteria. Dose reduction technique was used including one or more of the following: automated exposure control, adjustment of mA and kV according to patient size, and/or iterative  reconstruction. CONTRAST: With and without. COMPARISON: CT head 08/24/2024. FINDINGS: CTA NECK: COMMON CAROTID ARTERIES: Mild atherosclerosis at the right carotid bifurcation without hemodynamically significant stenosis. Mild atherosclerosis at the left carotid bifurcation without hemodynamically significant stenosis. No dissection or occlusion. INTERNAL CAROTID ARTERIES: No stenosis by NASCET criteria. No dissection or occlusion. VERTEBRAL ARTERIES: The right vertebral artery is dominant. The vertebral artery origins are  slightly obscured due to streak artifact from arthroplasty hardware as well as artifact from dense venous contrast. Visualized portions of the vertebral arteries are patent to the vertebrobasilar confluence. Mild atherosclerosis of the right V4 segment resulting in mild stenosis. No dissection or occlusion. 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 The anterior cerebral arteries are patent bilaterally and primarily supplied by the left carotid artery. No aneurysm. MIDDLE CEREBRAL ARTERIES: The middle cerebral arteries are patent bilaterally and primarily supplied by the left carotid artery. There is moderate stenosis of a proximal M2 branch of the left MCA. No aneurysm. POSTERIOR CEREBRAL ARTERIES: No significant stenosis. No occlusion. No aneurysm. BASILAR ARTERY: No significant stenosis. No occlusion. No aneurysm. OTHER: Partially visualized left chest wall pacer device with leads noted within the right atrium and ventricle as well as within the coronary sinus. Cardiomegaly. Bilateral pulmonary opacities and bronchovascular thickening which could reflect pulmonary edema. Moderate atherosclerosis at the left subclavian artery origin without significant stenosis. SOFT TISSUES: There is a 0.9 cm nodular focus of enhancing soft tissue in the right aspect of the vallecula abutting the epiglottis and the glossoepiglottic fold. Recommend nonemergent correlation with direct visualization. No masses or lymphadenopathy. BONES: Right shoulder arthroplasty hardware is partially visualized. No acute osseous abnormality. IMPRESSION: 1. No acute large vessel occlusion. 2. Moderate stenosis of a proximal M2 branch of the left MCA. 3. Mild stenosis of the left cavernous and supraclinoid ICA. 4. Mild atherosclerosis of the right V4 segment resulting in mild stenosis. 5. 0.9 cm nodular focus of enhancing soft tissue in the right aspect of the vallecula  abutting the epiglottis and the glossoepiglottic fold. Recommend nonemergent ENT evaluation with direct visualization. Electronically signed by: Donnice Mania MD 08/24/2024 04:28 PM EST RP Workstation: [id number]   CT HEAD CODE STROKE WO CONTRAST Result Date: 08/24/2024 EXAM: CT HEAD WITHOUT CONTRAST 08/24/2024 03:45:00 PM TECHNIQUE: CT of the head was performed without the administration of intravenous contrast. Automated exposure control, iterative reconstruction, and/or weight based adjustment of the mA/kV was utilized to reduce the radiation dose to as low as reasonably achievable. COMPARISON: 08/14/2023 CLINICAL HISTORY: Neuro deficit, acute, stroke suspected. FINDINGS: BRAIN AND VENTRICLES: Mild chronic microvascular ischemic changes. Small age indeterminate lacunar infarct in the right thalamus. Atherosclerosis of the carotid siphons and intracranial right vertebral artery. No acute hemorrhage. No hydrocephalus. No extra-axial collection. No mass effect or midline shift. ORBITS: Bilateral lens replacement. SINUSES: Postsurgical changes of the left ethmoid sinus and left aspect of the nasal cavity. Mucosal thickening throughout the paranasal sinuses with findings suggestive of left sided chronic sinusitis. SOFT TISSUES AND SKULL: No acute soft tissue abnormality. No skull fracture. [place] Stroke Program Early CT (ASPECT) score: Ganglionic (caudate, ic, lentiform nucleus, insula, M1-m3): 7 Supraganglionic (m4-m6): 3 Total: 10 IMPRESSION: 1. No acute intracranial hemorrhage. 2. ASPECT score 10. 3. Small age indeterminate lacunar infarct in the right thalamus. 4. Findings messaged to Dr. Matthews via the [facility] messaging system at 3:57 PM on 08/24/24. Electronically signed by: Donnice Mania MD 08/24/2024 03:58 PM EST RP Workstation: [id number]  Vitals:   08/24/24 2115 08/25/24 0259 08/25/24 0400 08/25/24 0500  BP:  (!) 121/59 99/62   Pulse: 90 88 89   Resp:  18 20   Temp:  97.9 F (36.6 C) 97.9 F (36.6 C)    TempSrc:  Oral Oral   SpO2:  96% 97%   Weight:    81.4 kg  Height:        PHYSICAL EXAM General:  Alert, well-nourished, well-developed patient in no acute distress CV: Regular rate and rhythm on monitor Respiratory:  Regular, unlabored respirations on room air Skin: Painful rash noted on right leg   NEURO:  Mental Status: AA&Ox3, patient is able to give clear and coherent history Speech/Language: speech is without dysarthria or aphasia.  Naming, repetition, fluency, and comprehension intact.  Cranial Nerves:  II: PERRL. Visual fields full.  III, IV, VI: EOMI. Eyelids elevate symmetrically.  V: Sensation is intact to light touch and symmetrical to face.  VII: Face is symmetrical resting and smiling VIII: hearing intact to voice. IX, X: Palate elevates symmetrically.  No dysarthria KP:Dynloizm shrug 5/5. XII: tongue is midline without fasciculations. Motor: Generalized weakness to all extremities with slight drift present. Tone: is normal and bulk is normal Sensation- Intact to light touch bilaterally. Extinction absent to light touch to DSS.   Coordination: FTN intact bilaterally, ataxia present in upper extremities. Gait- deferred  Most Recent NIH 5     ASSESSMENT/PLAN  Tanya Hogan is a  74 y.o. female with hx of chronic systolic CHF, HTN, PVD, DM2, presence of pacemaker, hypothyroidism comes into the hospital with shortness of breath and was found to have MRSA bacteremia and acute on chronic CHF.  Inpatient code stroke called 1/1  with stroke code exam noting subjective numbness, facial droop, dysarthria, and generalized weakness with pain in her extremities. Imaging only shows small age-indeterminate infarct, likely incidental. NIH: 7.  Strokelike episode with negative CT head x 2.  Age-Indeterminate right thalamic infarct, likely incidental finding in patient with generalized weakness and altered mental status due to encephalopathy secondary to infectious  process Code Stroke CT head  No acute intracranial hemorrhage. ASPECT score 10. Small age indeterminate lacunar infarct right thalamus. CTA head & neck  No acute LVO Moderate stenosis proximal left M2 Mild stenosis left cavernous and supraclinoid ICA Repeat CTH 1/2: Redemonstration of age-indeterminate right thalamic infarct Small remote lacunar infarct left corona radiata Mild chronic microvascular ischemic changes 2D Echo 12/25: EF less than 20%, grade 3 diastolic dysfunction, moderately enlarged right ventricle, moderately elevated PASP, severely dilated LA/RA TEE not pursued per cardiology due to high risk.   LDL 73 HgbA1c 5.9 VTE prophylaxis - Heparin  IV aspirin  81 mg daily prior to admission, now continued on aspirin  81 mg daily with Heparin  IV  Bilateral upper extremity DVTs per ultrasound 1/2 Therapy recommendations:  Home Health PT and Home Health OT Disposition:  pending  MRSA Bacteremia Blood cultures positive for MRSA 12/25 and 12/27.  Blood cultures drawn 12/29 appear to be negative. Redrawn today, pending.  ?IgA vasculitis in response  Hx of Stroke/TIA Patient says she had a stroke 3 months ago?, no vision in both eyes, says vision came back completely in less than an hour.   CHFrEF Vtach, refractory NICM Hypertension Home meds: Amiodarone  200 mg daily, spironolactone  25 mg daily, torsemide  20 mg daily Now on amiodarone  and quinidine BP goal normotension. Avoid hypotension  Hyperlipidemia Home meds: Pravastatin  40 mg daily LDL 73, goal < 70 Increase  to 80mg  Continue statin at discharge  Diabetes type II Controlled Home meds: Metformin  500 mg daily HgbA1c 5.9, goal < 7.0 Recommend close follow-up with PCP   Tobacco Abuse Former cigarette smoker  Other Stroke Risk Factors Obesity, Body mass index is 33.91 kg/m., BMI >/= 30 associated with increased stroke risk, recommend weight loss, diet and exercise as appropriate  Obstructive sleep apnea  Other  Active Problems   Hospital day # 9  Pt seen by Neuro NP/APP with MD. Note/plan to be edited by MD as needed.    Tanya JAYSON Likes, DNP [facility] Please use AMION for contact information & EPIC for messaging.  I have personally obtained history,examined this patient, reviewed notes, independently viewed imaging studies, participated in medical decision making and plan of care.ROS completed by me personally and pertinent positives fully documented  I have made any additions or clarifications directly to the above note. Agree with note above.  Patient had an episode yesterday starting with intense pain in the left forearm followed by some generalized weakness speech and facial difficulties which appear to have resolved.  CT head x 2 is negative for acute abnormality but does show age-indeterminate right thalamic and small left subcortical infarcts and small vessel disease.  Upper extremity ultrasound shows evidence of acute DVT.  Recommend change aspirin  to anticoagulation with NOACs.  Mobilize out of bed.  Therapy consults.  No further stroke workup is necessary at this time.  Long discussion patient and answered questions.  Discussed with Dr.Gherghe   I personally spent a total of 50 minutes in the care of the patient today including getting/reviewing separately obtained history, performing a medically appropriate exam/evaluation, counseling and educating, placing orders, referring and communicating with other health care professionals, documenting clinical information in the EHR, independently interpreting results, and coordinating care.         Eather Popp, MD Medical Director [facility] Pager: [phone] 08/25/2024 4:27 PM   To contact Stroke Continuity provider, please refer to [email or web address]. After hours, contact General Neurology

## 2024-08-26 DIAGNOSIS — I509 Heart failure, unspecified: Secondary | ICD-10-CM | POA: Diagnosis not present

## 2024-08-26 DIAGNOSIS — I4729 Other ventricular tachycardia: Secondary | ICD-10-CM | POA: Diagnosis not present

## 2024-08-26 DIAGNOSIS — R7881 Bacteremia: Secondary | ICD-10-CM | POA: Diagnosis not present

## 2024-08-26 DIAGNOSIS — I5043 Acute on chronic combined systolic (congestive) and diastolic (congestive) heart failure: Secondary | ICD-10-CM | POA: Diagnosis not present

## 2024-08-26 DIAGNOSIS — I428 Other cardiomyopathies: Secondary | ICD-10-CM | POA: Diagnosis not present

## 2024-08-26 DIAGNOSIS — Z515 Encounter for palliative care: Secondary | ICD-10-CM | POA: Diagnosis not present

## 2024-08-26 DIAGNOSIS — B9562 Methicillin resistant Staphylococcus aureus infection as the cause of diseases classified elsewhere: Secondary | ICD-10-CM | POA: Diagnosis not present

## 2024-08-26 DIAGNOSIS — Z7189 Other specified counseling: Secondary | ICD-10-CM | POA: Diagnosis not present

## 2024-08-26 LAB — CBC
HCT: 37.2 % (ref 36.0–46.0)
Hemoglobin: 12.5 g/dL (ref 12.0–15.0)
MCH: 30.9 pg (ref 26.0–34.0)
MCHC: 33.6 g/dL (ref 30.0–36.0)
MCV: 91.9 fL (ref 80.0–100.0)
Platelets: 270 K/uL (ref 150–400)
RBC: 4.05 MIL/uL (ref 3.87–5.11)
RDW: 15.2 % (ref 11.5–15.5)
WBC: 17.5 K/uL — ABNORMAL HIGH (ref 4.0–10.5)
nRBC: 0 % (ref 0.0–0.2)

## 2024-08-26 LAB — CULTURE, BLOOD (ROUTINE X 2)
Culture: NO GROWTH
Special Requests: ADEQUATE

## 2024-08-26 LAB — COMPREHENSIVE METABOLIC PANEL WITH GFR
ALT: 48 U/L — ABNORMAL HIGH (ref 0–44)
AST: 88 U/L — ABNORMAL HIGH (ref 15–41)
Albumin: 2.9 g/dL — ABNORMAL LOW (ref 3.5–5.0)
Alkaline Phosphatase: 209 U/L — ABNORMAL HIGH (ref 38–126)
Anion gap: 9 (ref 5–15)
BUN: 13 mg/dL (ref 8–23)
CO2: 24 mmol/L (ref 22–32)
Calcium: 9.6 mg/dL (ref 8.9–10.3)
Chloride: 97 mmol/L — ABNORMAL LOW (ref 98–111)
Creatinine, Ser: 0.73 mg/dL (ref 0.44–1.00)
GFR, Estimated: >60 mL/min
Glucose, Bld: 128 mg/dL — ABNORMAL HIGH (ref 70–99)
Potassium: 5.1 mmol/L (ref 3.5–5.1)
Sodium: 129 mmol/L — ABNORMAL LOW (ref 135–145)
Total Bilirubin: 1.1 mg/dL (ref 0.0–1.2)
Total Protein: 6.2 g/dL — ABNORMAL LOW (ref 6.5–8.1)

## 2024-08-26 LAB — GLUCOSE, CAPILLARY
Glucose-Capillary: 127 mg/dL — ABNORMAL HIGH (ref 70–99)
Glucose-Capillary: 140 mg/dL — ABNORMAL HIGH (ref 70–99)
Glucose-Capillary: 117 mg/dL — ABNORMAL HIGH (ref 70–99)
Glucose-Capillary: 93 mg/dL (ref 70–99)

## 2024-08-26 LAB — HEPARIN LEVEL (UNFRACTIONATED)
Heparin Unfractionated: 0.2 [IU]/mL — ABNORMAL LOW (ref 0.30–0.70)
Heparin Unfractionated: 0.25 [IU]/mL — ABNORMAL LOW (ref 0.30–0.70)

## 2024-08-26 LAB — MAGNESIUM: Magnesium: 2.1 mg/dL (ref 1.7–2.4)

## 2024-08-26 MED ORDER — TORSEMIDE 20 MG PO TABS
20.0000 mg | ORAL_TABLET | Freq: Every day | ORAL | Status: DC
  Administered 2024-08-26 – 2024-08-27 (×2): 20 mg via ORAL
  Filled 2024-08-26 (×2): qty 1

## 2024-08-26 NOTE — Progress Notes (Signed)
 PHARMACY - ANTICOAGULATION CONSULT NOTE  Pharmacy Consult for Heparin  Indication: DVT, bilateral upper extremities  Allergies[1]  Patient Measurements: Height: 5' 1 (154.9 cm) Weight: 81.4 kg (179 lb 7.3 oz) IBW/kg (Calculated) : 47.8 HEPARIN  DW (KG): 65.4  Vital Signs: Temp: 98.1 F (36.7 C) (01/03 1150) Temp Source: Oral (01/03 1150) BP: 103/59 (01/03 1150) Pulse Rate: 90 (01/03 0722)  Labs: Recent Labs    08/24/24 0522 08/25/24 2302 08/26/24 1120  HGB 12.8  --  12.5  HCT 38.8  --  37.2  PLT 228  --  270  HEPARINUNFRC  --  0.14* 0.20*  CREATININE 0.71  --  0.73    Estimated Creatinine Clearance: 58.7 mL/min (by C-G formula based on SCr of 0.73 mg/dL).   Medical History: Past Medical History:  Diagnosis Date   Alkaline phosphatase elevation    Asthma    Breast cancer (HCC) 2011   Carpal tunnel syndrome    bilateral   Chronic HFrEF (heart failure with reduced ejection fraction) (HCC)    Cirrhosis, non-alcoholic (HCC)    Closed fracture of unspecified part of radius (alone)    DDD (degenerative disc disease), lumbar    Depression    pt denies   Diabetes mellitus without complication (HCC)    Enlarged heart    Fibromyalgia    GERD (gastroesophageal reflux disease)    History of kidney stones    HTN (hypertension)    Hypothyroidism    IBS (irritable bowel syndrome)    LBBB (left bundle branch block)    Microcytic anemia 05/14/2017   Mitral regurgitation    NICM (nonischemic cardiomyopathy) (HCC)    Pneumonia    Presence of permanent cardiac pacemaker    Pulmonary hypertension (HCC)    PVD (peripheral vascular disease)    2019   Sleep apnea    uses cpap   Tricuspid regurgitation    Ventricular tachyarrhythmia [facility])    Assessment: 74 yr old female to begin IV heparin  for bilateral upper extremity DVT per duplex. Has been on Heparin  5000 units SQ Q8hrs for VTE prophylaxis.  Last dose 1/2 0522am. Acute generalized weakness on 1/1 and Code Stroke  called. CT of head showed small age indeterminate lacunar infarct in right thalamus. Noted Neuro/Stroke team dose note feel this was a stroke and repeat CT was negative.  Heparin  level this morning is below goal at 0.20 on 1050 units/hr. Level was drawn 3.5 hours late, so likely falsely low. Will increase dose conservatively. CBC stable. No bleeding or issues with infusion per RN.   Goal of Therapy:  Heparin  level 0.3-0.5 units/ml Monitor platelets by anticoagulation protocol: Yes   Plan:  Increase IV heparin  gtt to 1150 units/hr. Repeat heparin  level in 8 hrs Daily heparin  level and CBC.  Izetta Carl, PharmD PGY1 Pharmacy Resident          [1]  Allergies Allergen Reactions   Pressley [Ferric Carboxymaltose] Shortness Of Breath    Mild SOB following injectafer infusion    Penicillins Anaphylaxis and Rash    Tolerated ceftin  2020   Accupril [Quinapril Hcl] Other (See Comments)    Hyperkalemia=high potassium level   Celebrex [Celecoxib] Other (See Comments)    Hx hematemesis, bleeding ulcer   Cozaar  [Losartan  Potassium] Itching   Dml Forte Rash   Entresto [Sacubitril-Valsartan] Other (See Comments)    Hyperkalemia   Flonase  [Fluticasone ] Other (See Comments)    Hx nasal ulcer   Lactose Intolerance (Gi) Diarrhea   Lipitor [Atorvastatin ] Other (See  Comments)    Myalgias    Nasonex  [Mometasone  Furoate] Other (See Comments)    Epistaxis    Nsaids Other (See Comments)    Hx hematemesis, bleeding ulcer   Advil [Ibuprofen] Nausea And Vomiting    Hx hematemesis, bleeding ulcer   Baking Soda-Fluoride [Sodium Fluoride] Itching and Rash   Bayer Aspirin  [Aspirin ] Other (See Comments)    Hx hematemesis, bleeding ulcer   Chamomile Hives   Clindamycin /Lincomycin Rash   Codeine Nausea And Vomiting   Lasix  [Furosemide ] Other (See Comments)    Tinnitus  Hypotension   Miralax  [Polyethylene Glycol (Macrogol)] Other (See Comments)    Leaky gut syndrome   Quinine Rash    Reclast  [Zoledronic  Acid] Rash and Other (See Comments)    Tremors    Rifadin [Rifampin] Other (See Comments)    Unknown reaction   Singulair  [Montelukast ] Other (See Comments)    Dizziness    Sulfa Antibiotics Rash   Ultram  [Tramadol ] Anxiety and Rash   Vesicare  [Solifenacin ] Other (See Comments)    Urinary retention   Wellbutrin  [Bupropion ] Other (See Comments)    Myalgias  Skin crawling sensation   Wool Alcohol  [Lanolin] Rash

## 2024-08-26 NOTE — Progress Notes (Addendum)
 "   Progress Note  Patient Name: Tanya Hogan Date of Encounter: 08/26/2024  Primary Cardiologist:   Redell Shallow, MD   Subjective    Inpatient Medications    Scheduled Meds:  amiodarone   200 mg Oral Daily   arformoterol   15 mcg Nebulization BID   aspirin   81 mg Oral Daily   budesonide  (PULMICORT ) nebulizer solution  0.25 mg Nebulization BID   Chlorhexidine  Gluconate Cloth  6 each Topical Daily   insulin  aspart  0-5 Units Subcutaneous QHS   insulin  aspart  0-9 Units Subcutaneous TID WC   lactobacillus  1 g Oral TID WC   levothyroxine   112 mcg Oral Daily   multivitamin with minerals  1 tablet Oral Daily   nystatin   5 mL Oral QID   mouth rinse  15 mL Mouth Rinse 4 times per day   pantoprazole   40 mg Oral QAC breakfast   pravastatin   80 mg Oral QHS   quiNIDine sulfate   200 mg Oral TID   revefenacin   175 mcg Nebulization Daily   sodium chloride  flush  3 mL Intravenous Q12H   sodium chloride  flush  3 mL Intravenous Q12H   spironolactone   12.5 mg Oral Daily   Continuous Infusions:  DAPTOmycin  500 mg (08/25/24 1441)   heparin  1,050 Units/hr (08/26/24 0653)   PRN Meds: acetaminophen  **OR** acetaminophen , HYDROcodone -acetaminophen , levalbuterol , ondansetron  **OR** ondansetron  (ZOFRAN ) IV, mouth rinse, senna-docusate, traZODone    Vital Signs    Vitals:   08/26/24 0342 08/26/24 0400 08/26/24 0722 08/26/24 0908  BP: 106/89  114/61   Pulse: 87 89 90   Resp: 20 (!) 23 (!) 21   Temp: (!) 97.4 F (36.3 C)  97.6 F (36.4 C)   TempSrc: Oral  Axillary   SpO2: 98% 95% 91% 94%  Weight:      Height:        Intake/Output Summary (Last 24 hours) at 08/26/2024 1120 Last data filed at 08/26/2024 9346 Gross per 24 hour  Intake 724.51 ml  Output --  Net 724.51 ml    Filed Weights   08/20/24 0641 08/22/24 0411 08/25/24 0500  Weight: 80.8 kg 80 kg 81.4 kg    Telemetry    AV paced - Personally Reviewed  ECG    NA - Personally Reviewed  Physical Exam   GEN: Well  nourished, well developed in no acute distress HEENT: Normal NECK: No JVD; No carotid bruits LYMPHATICS: No lymphadenopathy CARDIAC:RRR, no murmurs, rubs, gallops RESPIRATORY:  crackles at bases ABDOMEN: Soft, non-tender, non-distended MUSCULOSKELETAL:  No edema; No deformity  SKIN: Warm and dry NEUROLOGIC:  Alert and oriented x 3 PSYCHIATRIC:  Normal affect  Labs    Chemistry Recent Labs  Lab 08/21/24 0400 08/22/24 0516 08/23/24 0852 08/24/24 0522  NA 134* 133* 132* 133*  K 4.1 4.3 4.6 4.8  CL 101 100 97* 99  CO2 24 25 27 26   GLUCOSE 108* 112* 120* 117*  BUN 13 9 9 12   CREATININE 0.58 0.65 0.70 0.71  CALCIUM  9.5 9.5 9.8 9.9  PROT 6.0* 5.9*  --  6.0*  ALBUMIN 2.8* 2.7*  --  2.9*  AST 114* 112*  --  83*  ALT 77* 73*  --  55*  ALKPHOS 206* 234*  --  205*  BILITOT 0.6 0.6  --  0.7  GFRNONAA >60 >60 >60 >60  ANIONGAP 9 8 8 9      Hematology Recent Labs  Lab 08/23/24 0452 08/23/24 0852 08/24/24 0522  WBC 10.2 10.6*  11.9*  RBC 4.37 4.45 4.19  HGB 13.7 13.7 12.8  HCT 41.4 41.3 38.8  MCV 94.7 92.8 92.6  MCH 31.4 30.8 30.5  MCHC 33.1 33.2 33.0  RDW 14.7 14.7 14.9  PLT 224 252 228    Cardiac EnzymesNo results for input(s): TROPONINI in the last 168 hours. No results for input(s): TROPIPOC in the last 168 hours.   BNP No results for input(s): BNP, PROBNP in the last 168 hours.    DDimer No results for input(s): DDIMER in the last 168 hours.   Radiology    VAS US  UPPER EXTREMITY VENOUS DUPLEX Result Date: 08/26/2024 UPPER VENOUS STUDY  Patient Name:  LAMIRACLE CHAIDEZ  Date of Exam:   08/25/2024 Medical Rec #: [id number]        Accession #:    [id number] Date of Birth: 02/22/1951         Patient Gender: F Patient Age:   74 years Exam Location:  [facility] [place] Procedure:      VAS US  UPPER EXTREMITY VENOUS DUPLEX Referring Phys: COSTIN GHERGHE --------------------------------------------------------------------------------  Indications: Swelling, and rule out  DVT Comparison Study: No prior exam. Performing Technologist: Edilia Elden Appl  Examination Guidelines: A complete evaluation includes B-mode imaging, spectral Doppler, color Doppler, and power Doppler as needed of all accessible portions of each vessel. Bilateral testing is considered an integral part of a complete examination. Limited examinations for reoccurring indications may be performed as noted.  Right Findings: +----------+------------+---------+-----------+----------+-------+ RIGHT     CompressiblePhasicitySpontaneousPropertiesSummary +----------+------------+---------+-----------+----------+-------+ IJV           Full       Yes       Yes                      +----------+------------+---------+-----------+----------+-------+ Subclavian    Full       Yes       Yes                      +----------+------------+---------+-----------+----------+-------+ Axillary      Full       Yes       Yes                      +----------+------------+---------+-----------+----------+-------+ Brachial      Full       Yes       Yes                      +----------+------------+---------+-----------+----------+-------+ Radial        Full                                          +----------+------------+---------+-----------+----------+-------+ Ulnar         Full                                          +----------+------------+---------+-----------+----------+-------+ Cephalic      Full       Yes       Yes                      +----------+------------+---------+-----------+----------+-------+ Basilic       Full       Yes  Yes                      +----------+------------+---------+-----------+----------+-------+ Superficial vein thrombosis noted in the Antecubital vein by IV line.  Left Findings: +----------+------------+---------+-----------+----------+-------+ LEFT      CompressiblePhasicitySpontaneousPropertiesSummary  +----------+------------+---------+-----------+----------+-------+ IJV         Partial      Yes       Yes                      +----------+------------+---------+-----------+----------+-------+ Subclavian    Full       Yes       Yes                      +----------+------------+---------+-----------+----------+-------+ Axillary      Full       Yes       Yes                      +----------+------------+---------+-----------+----------+-------+ Brachial      Full       Yes       Yes                      +----------+------------+---------+-----------+----------+-------+ Radial        Full                                          +----------+------------+---------+-----------+----------+-------+ Ulnar         Full                                          +----------+------------+---------+-----------+----------+-------+ Cephalic    Partial      No        No                       +----------+------------+---------+-----------+----------+-------+ Basilic     Partial      Yes       Yes                      +----------+------------+---------+-----------+----------+-------+ Deep vein thrombosis noted in the left jugular vein. Superficial vein thrombosis noted in the cephalic vein from the antecubital fossa to the the middle forearm and in a short segment of the basilic vein in the distal upper arm.  Summary:  Right: Findings consistent with acute superficial vein thrombosis involving the antecubital vein.  Left: Findings consistent with acute to age indeterminate deep vein thrombosis involving the left internal jugular vein. Findings consistent with acute superficial vein thrombosis involving the left basilic vein and left cephalic vein.  *See table(s) above for measurements and observations.  Diagnosing physician: Penne Colorado MD Electronically signed by Penne Colorado MD on 08/26/2024 at 10:41:22 AM.    Final    CT HEAD WO CONTRAST ( ) Result Date: 08/25/2024 EXAM:  CT HEAD WITHOUT CONTRAST 08/25/2024 12:45:47 PM TECHNIQUE: CT of the head was performed without the administration of intravenous contrast. Automated exposure control, iterative reconstruction, and/or weight based adjustment of the mA/kV was utilized to reduce the radiation dose to as low as reasonably achievable. COMPARISON: CT Head dated 08/24/2024. CLINICAL HISTORY: Stroke, follow up. FINDINGS: BRAIN AND VENTRICLES: No acute hemorrhage. Redemonstration of  age indeterminate infarct in the right thalamus. There is a small remote lacunar infarct involving the left corona radiata. Mild atherosclerosis within the carotid siphons. Mild chronic microvascular ischemic changes. No hydrocephalus. No extra-axial collection. No mass effect or midline shift. ORBITS: Bilateral lens replacement. SINUSES: Paranasal sinus mucosal thickening. SOFT TISSUES AND SKULL: No acute soft tissue abnormality. No skull fracture. IMPRESSION: 1. Redemonstration of age indeterminate infarct in the right thalamus. 2. Small remote lacunar infarct involving the left corona radiata. 3. Mild chronic microvascular ischemic changes. Electronically signed by: Donnice Mania MD 08/25/2024 01:21 PM EST RP Workstation: [id number]   CT ANGIO HEAD NECK W WO CM (CODE STROKE) Result Date: 08/24/2024 EXAM: CTA Head and Neck with Intravenous Contrast. CLINICAL HISTORY: Neuro deficit, acute, stroke suspected. TECHNIQUE: Axial CTA images of the head and neck performed with intravenous contrast. MIP reconstructed images were created and reviewed. Axial computed tomography images of the head/brain performed without intravenous contrast. Note: Per PQRS, the description of internal carotid artery percent stenosis, including 0 percent or normal exam, is based on [place] [employer] Symptomatic Carotid Endarterectomy Trial (NASCET) criteria. Dose reduction technique was used including one or more of the following: automated exposure control, adjustment of mA and kV  according to patient size, and/or iterative reconstruction. CONTRAST: With and without. COMPARISON: CT head 08/24/2024. FINDINGS: CTA NECK: COMMON CAROTID ARTERIES: Mild atherosclerosis at the right carotid bifurcation without hemodynamically significant stenosis. Mild atherosclerosis at the left carotid bifurcation without hemodynamically significant stenosis. No dissection or occlusion. INTERNAL CAROTID ARTERIES: No stenosis by NASCET criteria. No dissection or occlusion. VERTEBRAL ARTERIES: The right vertebral artery is dominant. The vertebral artery origins are slightly obscured due to streak artifact from arthroplasty hardware as well as artifact from dense venous contrast. Visualized portions of the vertebral arteries are patent to the vertebrobasilar confluence. Mild atherosclerosis of the right V4 segment resulting in mild stenosis. No dissection or occlusion. 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 The anterior cerebral arteries are patent bilaterally and primarily supplied by the left carotid artery. No aneurysm. MIDDLE CEREBRAL ARTERIES: The middle cerebral arteries are patent bilaterally and primarily supplied by the left carotid artery. There is moderate stenosis of a proximal M2 branch of the left MCA. No aneurysm. POSTERIOR CEREBRAL ARTERIES: No significant stenosis. No occlusion. No aneurysm. BASILAR ARTERY: No significant stenosis. No occlusion. No aneurysm. OTHER: Partially visualized left chest wall pacer device with leads noted within the right atrium and ventricle as well as within the coronary sinus. Cardiomegaly. Bilateral pulmonary opacities and bronchovascular thickening which could reflect pulmonary edema. Moderate atherosclerosis at the left subclavian artery origin without significant stenosis. SOFT TISSUES: There is a 0.9 cm nodular focus of enhancing soft tissue in the right aspect of the vallecula  abutting the epiglottis and the glossoepiglottic fold. Recommend nonemergent correlation with direct visualization. No masses or lymphadenopathy. BONES: Right shoulder arthroplasty hardware is partially visualized. No acute osseous abnormality. IMPRESSION: 1. No acute large vessel occlusion. 2. Moderate stenosis of a proximal M2 branch of the left MCA. 3. Mild stenosis of the left cavernous and supraclinoid ICA. 4. Mild atherosclerosis of the right V4 segment resulting in mild stenosis. 5. 0.9 cm nodular focus of enhancing soft tissue in the right aspect of the vallecula abutting the epiglottis and the glossoepiglottic fold. Recommend nonemergent ENT evaluation with direct visualization. Electronically signed by: Donnice Mania MD 08/24/2024 04:28 PM EST RP Workstation: [id number]   CT HEAD CODE STROKE  WO CONTRAST Result Date: 08/24/2024 EXAM: CT HEAD WITHOUT CONTRAST 08/24/2024 03:45:00 PM TECHNIQUE: CT of the head was performed without the administration of intravenous contrast. Automated exposure control, iterative reconstruction, and/or weight based adjustment of the mA/kV was utilized to reduce the radiation dose to as low as reasonably achievable. COMPARISON: 08/14/2023 CLINICAL HISTORY: Neuro deficit, acute, stroke suspected. FINDINGS: BRAIN AND VENTRICLES: Mild chronic microvascular ischemic changes. Small age indeterminate lacunar infarct in the right thalamus. Atherosclerosis of the carotid siphons and intracranial right vertebral artery. No acute hemorrhage. No hydrocephalus. No extra-axial collection. No mass effect or midline shift. ORBITS: Bilateral lens replacement. SINUSES: Postsurgical changes of the left ethmoid sinus and left aspect of the nasal cavity. Mucosal thickening throughout the paranasal sinuses with findings suggestive of left sided chronic sinusitis. SOFT TISSUES AND SKULL: No acute soft tissue abnormality. No skull fracture. [place] Stroke Program Early CT (ASPECT) score: Ganglionic  (caudate, ic, lentiform nucleus, insula, M1-m3): 7 Supraganglionic (m4-m6): 3 Total: 10 IMPRESSION: 1. No acute intracranial hemorrhage. 2. ASPECT score 10. 3. Small age indeterminate lacunar infarct in the right thalamus. 4. Findings messaged to Dr. Matthews via the [facility] messaging system at 3:57 PM on 08/24/24. Electronically signed by: Donnice Mania MD 08/24/2024 03:58 PM EST RP Workstation: [id number]    Cardiac Studies    ECHO:  08/17/24  1. Left ventricular ejection fraction, by estimation, is <20%. The left  ventricle has severely decreased function. The left ventricle demonstrates  global hypokinesis. The left ventricular internal cavity size was severely  dilated. Left ventricular  diastolic parameters are consistent with Grade III diastolic dysfunction  (restrictive).   2. Right ventricular systolic function is moderately reduced. The right  ventricular size is moderately enlarged. There is moderately elevated  pulmonary artery systolic pressure. The estimated right ventricular  systolic pressure is 48.4 mmHg.   3. Left atrial size was severely dilated.   4. Right atrial size was severely dilated.   5. A small pericardial effusion is present. The pericardial effusion is  anterior to the right ventricle. There is no evidence of cardiac  tamponade.   6. The mitral valve is normal in structure. Trivial mitral valve  regurgitation. No evidence of mitral stenosis.   7. The aortic valve is tricuspid. There is mild calcification of the  aortic valve. Aortic valve regurgitation is not visualized. Aortic valve  sclerosis/calcification is present, without any evidence of aortic  stenosis.   8. The inferior vena cava is dilated in size with <50% respiratory  variability, suggesting right atrial pressure of 15 mmHg.    Patient Profile     74 y.o. female with a NICM/CHB, HFrEF likely in part 2/2 pacing-induced CM s/p LEFT MDT CRTP (RA/RV/CS-AIV lead) with subsequent addition of RV-LBaP lead  (11/03/21) with subsequent addition of RV-d lead (05/31/24) for ATP for refractory VT, DM2, NASH cirrhosis who presented with lethargy and respiratory distress and is now found to have 4/4 cultures with MRSA.   Assessment & Plan    Bacteremia:  MRSA.  Plan is suppressive antibiotic therapy although clinically she seems to be failing this with diffuse joint pain, rash likely vasculitis, acute neurologic event possibly related.  12/29 cultures are 1 out of 4 positive. Repeat BC were drawn.     -appreciate EP input yesterday.  Patient not a candidate at this time for lead extraction given comorbidities -continue antibx per ID  Non ischemic cardiomyopathy Acute on Chronic systolic CHF:  -2D echo EF <20% GHK, G3DD, moderate RV dysfunction,  PHTN PASP 48, small PE -s/p CRTP-P and felt not a candidate for device extraction in setting of bacteremia -BP soft overnight -GDMT limited by hypotension and allergies to ARBs and Entresto -has crackles at bases this am -continue spiro 12.5mg  daily -Add back home dose of Demadex  20mg  daily  VT:  No sustained arrhythmias.   -NSR on tele with PVCs today -Continue Amio 200mg  daily and Quinidine 200mg  TID  Petechiae:   I would be inclined to think that it is related to the infection itself and possibly a concurrent vasculitis.   Platelets are OK.    For questions or updates, please contact [facility] Please consult [email or web address] for contact info under Cardiology/STEMI.   Signed, Wilbert Bihari, MD  08/26/2024, 11:20 AM    "

## 2024-08-26 NOTE — Progress Notes (Signed)
 " PROGRESS NOTE  Tanya Hogan FMW:[id number] DOB: [birth year] DOA: 08/16/2024 PCP: [name], MD   LOS: 10 days   Brief Narrative / Interim history: 74 year old female with history of chronic systolic CHF, HTN, PVD, DM2, presence of pacemaker, hypothyroidism comes into the hospital with shortness of breath.  Of note, she was recently hospitalized mid-December for CHF exacerbation, sent home after diuresis, but developed recurrent dyspnea and lower extremity edema at home and came back to the ED.  She was found to have MRSA bacteremia.  ID consulted and following.  Due to presence of pacemaker cardiology consulted as well.  She is deemed to be a very poor candidate for pacer lead extraction given end-stage heart failure and poor prognosis.  Palliative consulted as well  Subjective / 24h Interval events: She is doing slightly better, denies any pain this morning.  No chest discomfort, no shortness of breath.  Awaiting breakfast  Assesement and Plan: Principal problem MRSA bacteremia -ID following, appreciate input.  Currently she is on daptomycin .  She has a pacemaker in place but not a candidate to remove so likely needs suppressive prolonged antibiotics - A TEE was not pursued due to high risk.  Blood cultures were positive for MRSA on 12/25 and 12/27, and surveillance cultures on 12/29 initially appeared to be negative, now unfortunately 1 out of 4 bottles are still showing MRSA.  This is somewhat of a poor prognosis as she may not be able to completely clear her bacteremia without pacemaker removal.  EP reevaluated patient 1/2, she is not a candidate for pacemaker and lead removal - Palliative care consulted as well - Repeat surveillance cultures were done on 1/2, currently without growth, continue to closely monitor - Appreciate ID follow-up  Active problems Acute generalized weakness -this is happening on 08/24/2024.  A code stroke was called, she was evaluated at the bedside by  the neurology team.  A CT of the head showed a small age-indeterminate lacunar infarct in the right thalamus.  Neurology/stroke team evaluated patient today, discussed at bedside with Dr. Rosemarie, he does not really think this was a stroke.  Skin rash -in the setting of MRSA bacteremia, possibly IgA vasculitis.  Continue to closely monitor, continues to improve  Acute on chronic systolic CHF -most recent 2D echo showed LVEF less than 20%, global hypokinesis, severe LV dilatation, grade 3 diastolic dysfunction, RV systolic function moderate reduction, RVSP 48, biatrial severe dilatation and small pericardial effusion.  She was diuresed - Volume status better now.  Cardiology following, resuming Demadex  today  CHB-with pacemaker.  Recently had subsequent addition of RV-the lead on October 2025 for ATP for refractory VT  History of VT-no arrhythmias.  Continue suppressive treatment with amiodarone  as well as quinidine  Hypothyroidism -continue Synthroid   Essential hypertension-continue spironolactone   NASH cirrhosis-no signs of decompensation  Underlying COPD-no wheezing, stable, continue bronchodilator therapy  History of left breast cancer-outpatient follow-up, in remission  Microcytic anemia-continue to monitor, no bleeding  Hyponatremia-overall stable, no large shifts noted  Obesity, class I-BMI 33.3.    Scheduled Meds:  amiodarone   200 mg Oral Daily   arformoterol   15 mcg Nebulization BID   aspirin   81 mg Oral Daily   budesonide  (PULMICORT ) nebulizer solution  0.25 mg Nebulization BID   Chlorhexidine  Gluconate Cloth  6 each Topical Daily   insulin  aspart  0-5 Units Subcutaneous QHS   insulin  aspart  0-9 Units Subcutaneous TID WC   lactobacillus  1 g Oral TID WC  levothyroxine   112 mcg Oral Daily   multivitamin with minerals  1 tablet Oral Daily   nystatin   5 mL Oral QID   mouth rinse  15 mL Mouth Rinse 4 times per day   pantoprazole   40 mg Oral QAC breakfast   pravastatin    80 mg Oral QHS   quiNIDine sulfate   200 mg Oral TID   revefenacin   175 mcg Nebulization Daily   sodium chloride  flush  3 mL Intravenous Q12H   sodium chloride  flush  3 mL Intravenous Q12H   spironolactone   12.5 mg Oral Daily   torsemide   20 mg Oral Daily   Continuous Infusions:  DAPTOmycin  500 mg (08/26/24 1240)   heparin  1,150 Units/hr (08/26/24 1241)   PRN Meds:.acetaminophen  **OR** acetaminophen , HYDROcodone -acetaminophen , levalbuterol , ondansetron  **OR** ondansetron  (ZOFRAN ) IV, mouth rinse, senna-docusate, traZODone   Current Outpatient Medications  Medication Instructions   albuterol  (PROVENTIL ) 2.5 mg, Nebulization, Daily PRN   albuterol  (VENTOLIN  HFA) 108 (90 Base) MCG/ACT inhaler 2 puffs, Inhalation, Every 6 hours PRN   alendronate  (FOSAMAX ) 70 MG tablet TAKE 1 TABLET BY MOUTH EVERY 7  DAYS WITH FULL GLASS OF WATER  ON AN EMPTY STOMACH   amiodarone  (PACERONE ) 200 mg, Oral, Daily   aspirin  81 mg, Oral, Daily   Calcium  Carbonate-Vit D-Min (CALCIUM  600+D PLUS MINERALS) 600-400 MG-UNIT TABS 1 tab p.o. twice daily   EPINEPHrine  (EPI-PEN) 0.3 mg, Intramuscular, As needed   fexofenadine  (ALLEGRA ) 180 mg, Oral, Daily   fluticasone -salmeterol (WIXELA INHUB) 250-50 MCG/ACT AEPB 1 puff, Inhalation, 2 times daily   levothyroxine  (SYNTHROID ) 112 mcg, Oral, Daily   metFORMIN  (GLUCOPHAGE -XR) 500 mg, Oral, Daily, Per 08/09/24 discharge states 1 tablet twice daily - patient states PCP has her on this once daily and they plan to discuss once or twice daily at 08/14/24 appt   Multiple Vitamins-Minerals (MULTIVITAMIN WOMEN 50+) TABS 1 tablet, Daily   pantoprazole  (PROTONIX ) 40 mg, Oral, Daily before breakfast   pravastatin  (PRAVACHOL ) 40 mg, Oral, Daily at bedtime   quiNIDine sulfate  200 mg, Oral, 3 times daily   SPIRIVA  RESPIMAT 2.5 MCG/ACT AERS 2 puffs, Inhalation, Daily   spironolactone  (ALDACTONE ) 12.5 mg, Oral, Daily   torsemide  (DEMADEX ) 20 mg, Oral, Daily, Take 2 tablets daily in case of  weight gain 2 to 3 lbs in 24 hrs or 5 lbs in 7 days   valACYclovir  (VALTREX ) 1,000 mg, Oral, 2 times daily    Diet Orders (From admission, onward)     Start     Ordered   08/20/24 1220  Diet Heart Room service appropriate? Yes with Assist; Fluid consistency: Thin  Diet effective now       Question Answer Comment  Room service appropriate? Yes with Assist   Fluid consistency: Thin      08/20/24 1219            DVT prophylaxis: SCDs Start: 08/16/24 1020   Lab Results  Component Value Date   PLT 270 08/26/2024      Code Status: Limited: Do not attempt resuscitation (DNR) -DNR-LIMITED -Do Not Intubate/DNI   Family Communication: No family at bedside  Status is: Inpatient Remains inpatient appropriate because: Severity of illness   Level of care: Telemetry  Consultants:  ID Cardiology  Objective: Vitals:   08/26/24 0400 08/26/24 0722 08/26/24 0908 08/26/24 1150  BP:  114/61  (!) 103/59  Pulse: 89 90    Resp: (!) 23 (!) 21  20  Temp:  97.6 F (36.4 C)  98.1 F (36.7 C)  TempSrc:  Axillary  Oral  SpO2: 95% 91% 94%   Weight:      Height:        Intake/Output Summary (Last 24 hours) at 08/26/2024 1250 Last data filed at 08/26/2024 9346 Gross per 24 hour  Intake 484.51 ml  Output --  Net 484.51 ml    Wt Readings from Last 3 Encounters:  08/25/24 81.4 kg  08/14/24 81.2 kg  08/09/24 79.4 kg    Examination:  Constitutional: NAD Eyes: lids and conjunctivae normal, no scleral icterus ENMT: mmm Neck: normal, supple Respiratory: clear to auscultation bilaterally, no wheezing, no crackles. Normal respiratory effort.  Cardiovascular: Regular rate and rhythm, no murmurs / rubs / gallops. Trace LE edema. Abdomen: soft, no distention, no tenderness. Bowel sounds positive.   Data Reviewed: I have independently reviewed following labs and imaging studies   CBC Recent Labs  Lab 08/20/24 0340 08/23/24 0452 08/23/24 0852 08/24/24 0522 08/26/24 1120  WBC  10.8* 10.2 10.6* 11.9* 17.5*  HGB 12.6 13.7 13.7 12.8 12.5  HCT 37.8 41.4 41.3 38.8 37.2  PLT 176 224 252 228 270  MCV 92.9 94.7 92.8 92.6 91.9  MCH 31.0 31.4 30.8 30.5 30.9  MCHC 33.3 33.1 33.2 33.0 33.6  RDW 14.5 14.7 14.7 14.9 15.2  LYMPHSABS  --   --  1.3  --   --   MONOABS  --   --  0.5  --   --   EOSABS  --   --  0.1  --   --   BASOSABS  --   --  0.1  --   --     Recent Labs  Lab 08/20/24 0340 08/21/24 0400 08/22/24 0516 08/23/24 0852 08/24/24 0522 08/26/24 1120  NA 134* 134* 133* 132* 133* 129*  K 3.7 4.1 4.3 4.6 4.8 5.1  CL 99 101 100 97* 99 97*  CO2 28 24 25 27 26 24   GLUCOSE 112* 108* 112* 120* 117* 128*  BUN 19 13 9 9 12 13   CREATININE 0.68 0.58 0.65 0.70 0.71 0.73  CALCIUM  9.1 9.5 9.5 9.8 9.9 9.6  AST 144* 114* 112*  --  83* 88*  ALT 88* 77* 73*  --  55* 48*  ALKPHOS 208* 206* 234*  --  205* 209*  BILITOT 0.7 0.6 0.6  --  0.7 1.1  ALBUMIN 2.8* 2.8* 2.7*  --  2.9* 2.9*  MG 2.1 2.0  --   --  1.9 2.1    ------------------------------------------------------------------------------------------------------------------ Recent Labs    08/25/24 1003  CHOL 123  HDL 32*  LDLCALC 69  TRIG 892  CHOLHDL 3.8    Lab Results  Component Value Date   HGBA1C 5.9 (H) 08/17/2024   ------------------------------------------------------------------------------------------------------------------ No results for input(s): TSH, T4TOTAL, T3FREE, THYROIDAB in the last 72 hours.  Invalid input(s): FREET3  Cardiac Enzymes No results for input(s): CKMB, TROPONINI, MYOGLOBIN in the last 168 hours.  Invalid input(s): CK ------------------------------------------------------------------------------------------------------------------    Component Value Date/Time   BNP 1,744.7 (H) 08/02/2024 1942   BNP 193 (H) 08/10/2022 1158    CBG: Recent Labs  Lab 08/25/24 1204 08/25/24 1631 08/25/24 2206 08/26/24 0647 08/26/24 1147  GLUCAP 157* 113* 120*  127* 140*    Recent Results (from the past 240 hours)  MRSA Next Gen by PCR, Nasal     Status: Abnormal   Collection Time: 08/17/24  4:51 PM   Specimen: Nasal Mucosa; Nasal Swab  Result Value Ref Range Status   MRSA by PCR  Next Gen DETECTED (A) NOT DETECTED Final    Comment: RESULT CALLED TO, READ BACK BY AND VERIFIED WITH: L BROWN RN 08/17/2024 @ 2142 BY AB (NOTE) The GeneXpert MRSA Assay (FDA approved for NASAL specimens only), is one component of a comprehensive MRSA colonization surveillance program. It is not intended to diagnose MRSA infection nor to guide or monitor treatment for MRSA infections. Test performance is not FDA approved in patients less than 47 years old. Performed at [facility], [address] [place], [place] [address]   Culture, blood (Routine X 2) w Reflex to ID Panel     Status: Abnormal   Collection Time: 08/17/24  5:58 PM   Specimen: BLOOD LEFT HAND  Result Value Ref Range Status   Specimen Description BLOOD LEFT HAND  Final   Special Requests   Final    BOTTLES DRAWN AEROBIC AND ANAEROBIC Blood Culture adequate volume   Culture  Setup Time   Final    GRAM POSITIVE COCCI IN BOTH AEROBIC AND ANAEROBIC BOTTLES CRITICAL RESULT CALLED TO, READ BACK BY AND VERIFIED WITH: PHARMD J.FRENS AT 0802 ON 08/18/2024 BY T.SAAD.    Culture (A)  Final    METHICILLIN RESISTANT STAPHYLOCOCCUS AUREUS Sent to [facility] for further susceptibility testing. Performed at [facility], [address], [place], [place] [address]    Report Status 08/21/2024 FINAL  Final   Organism ID, Bacteria METHICILLIN RESISTANT STAPHYLOCOCCUS AUREUS  Final      Susceptibility   Methicillin resistant staphylococcus aureus - MIC*    CIPROFLOXACIN  >=8 RESISTANT Resistant     ERYTHROMYCIN  >=8 RESISTANT Resistant     GENTAMICIN  <=0.5 SENSITIVE Sensitive     OXACILLIN >=4 RESISTANT Resistant     TETRACYCLINE <=1 SENSITIVE Sensitive     VANCOMYCIN  1 SENSITIVE Sensitive      TRIMETH/SULFA <=10 SENSITIVE Sensitive     CLINDAMYCIN  <=0.25 SENSITIVE Sensitive     RIFAMPIN <=0.5 SENSITIVE Sensitive     Inducible Clindamycin  NEGATIVE Sensitive     LINEZOLID 2 SENSITIVE Sensitive     * METHICILLIN RESISTANT STAPHYLOCOCCUS AUREUS  Blood Culture ID Panel (Reflexed)     Status: Abnormal   Collection Time: 08/17/24  5:58 PM  Result Value Ref Range Status   Enterococcus faecalis NOT DETECTED NOT DETECTED Final   Enterococcus Faecium NOT DETECTED NOT DETECTED Final   Listeria monocytogenes NOT DETECTED NOT DETECTED Final   Staphylococcus species DETECTED (A) NOT DETECTED Final    Comment: CRITICAL RESULT CALLED TO, READ BACK BY AND VERIFIED WITH: PHARMD J.FRENS AT 0802 ON 08/18/2024 BY T.SAAD.    Staphylococcus aureus (BCID) DETECTED (A) NOT DETECTED Final    Comment: Methicillin (oxacillin)-resistant Staphylococcus aureus (MRSA). MRSA is predictably resistant to beta-lactam antibiotics (except ceftaroline ). Preferred therapy is vancomycin  unless clinically contraindicated. Patient requires contact precautions if  hospitalized. CRITICAL RESULT CALLED TO, READ BACK BY AND VERIFIED WITH: PHARMD J.FRENS AT 0802 ON 08/18/2024 BY T.SAAD.    Staphylococcus epidermidis NOT DETECTED NOT DETECTED Final   Staphylococcus lugdunensis NOT DETECTED NOT DETECTED Final   Streptococcus species NOT DETECTED NOT DETECTED Final   Streptococcus agalactiae NOT DETECTED NOT DETECTED Final   Streptococcus pneumoniae NOT DETECTED NOT DETECTED Final   Streptococcus pyogenes NOT DETECTED NOT DETECTED Final   A.calcoaceticus-baumannii NOT DETECTED NOT DETECTED Final   Bacteroides fragilis NOT DETECTED NOT DETECTED Final   Enterobacterales NOT DETECTED NOT DETECTED Final   Enterobacter cloacae complex NOT DETECTED NOT DETECTED Final   Escherichia  coli NOT DETECTED NOT DETECTED Final   Klebsiella aerogenes NOT DETECTED NOT DETECTED Final   Klebsiella oxytoca NOT DETECTED NOT DETECTED Final    Klebsiella pneumoniae NOT DETECTED NOT DETECTED Final   Proteus species NOT DETECTED NOT DETECTED Final   Salmonella species NOT DETECTED NOT DETECTED Final   Serratia marcescens NOT DETECTED NOT DETECTED Final   Haemophilus influenzae NOT DETECTED NOT DETECTED Final   Neisseria meningitidis NOT DETECTED NOT DETECTED Final   Pseudomonas aeruginosa NOT DETECTED NOT DETECTED Final   Stenotrophomonas maltophilia NOT DETECTED NOT DETECTED Final   Candida albicans NOT DETECTED NOT DETECTED Final   Candida auris NOT DETECTED NOT DETECTED Final   Candida glabrata NOT DETECTED NOT DETECTED Final   Candida krusei NOT DETECTED NOT DETECTED Final   Candida parapsilosis NOT DETECTED NOT DETECTED Final   Candida tropicalis NOT DETECTED NOT DETECTED Final   Cryptococcus neoformans/gattii NOT DETECTED NOT DETECTED Final   Meth resistant mecA/C and MREJ DETECTED (A) NOT DETECTED Final    Comment: CRITICAL RESULT CALLED TO, READ BACK BY AND VERIFIED WITH: PHARMD J.FRENS AT 0802 ON 08/18/2024 BY T.SAAD. Performed at [facility], [address], [place], [place] [address]   MIC (1 Drug)-     Status: Abnormal   Collection Time: 08/17/24  5:58 PM  Result Value Ref Range Status   Min Inhibitory Conc (1 Drug) Final report (A)  Corrected    Comment: (NOTE) Performed At: [facility] [address] [place], [place] [id number] Jennette Shorter MD Ey:[id number] CORRECTED ON 01/02 AT 1335: PREVIOUSLY REPORTED AS Preliminary report    Source LAB [address] MRSA DAPTO BLOOS  Corrected    Comment: Performed at [facility], [address], [place], [place] [address] CORRECTED ON 12/30 AT 0800: PREVIOUSLY REPORTED AS BLOOD   MIC Result     Status: Abnormal   Collection Time: 08/17/24  5:58 PM  Result Value Ref Range Status   Result 1 (MIC) Comment (A)  Final    Comment: (NOTE) Methicillin - resistant Staphylococcus aureus Identification performed by account, not confirmed by  this laboratory. Testing performed by broth microdilution. DAPTOMYCIN   0.5 ug/mL  SUSCEPTIBLE Performed At: [facility] [address] [place], [place] [id number] Jennette Shorter MD Ey:[id number]   Culture, blood (Routine X 2) w Reflex to ID Panel     Status: Abnormal   Collection Time: 08/17/24  6:01 PM   Specimen: BLOOD RIGHT HAND  Result Value Ref Range Status   Specimen Description BLOOD RIGHT HAND  Final   Special Requests   Final    BOTTLES DRAWN AEROBIC AND ANAEROBIC Blood Culture results may not be optimal due to an inadequate volume of blood received in culture bottles   Culture  Setup Time   Final    GRAM POSITIVE COCCI IN BOTH AEROBIC AND ANAEROBIC BOTTLES CRITICAL VALUE NOTED.  VALUE IS CONSISTENT WITH PREVIOUSLY REPORTED AND CALLED VALUE.    Culture (A)  Final    STAPHYLOCOCCUS AUREUS SUSCEPTIBILITIES PERFORMED ON PREVIOUS CULTURE WITHIN THE LAST 5 DAYS. Performed at [facility], [address], [place], [place] [address]    Report Status 08/21/2024 FINAL  Final  Culture, blood (Routine X 2) w Reflex to ID Panel     Status: Abnormal   Collection Time: 08/19/24 11:15 AM   Specimen: BLOOD  Result Value Ref Range Status   Specimen Description BLOOD BLOOD RIGHT ARM  Final   Special Requests   Final  BOTTLES DRAWN AEROBIC AND ANAEROBIC Blood Culture results may not be optimal due to an inadequate volume of blood received in culture bottles   Culture  Setup Time   Final    GRAM POSITIVE COCCI IN CLUSTERS IN BOTH AEROBIC AND ANAEROBIC BOTTLES Gram Stain Report Called to,Read Back By and Verified With: PHARMD G ABBOTT 08/20/2024 @ 0552 BY AB CRITICAL VALUE NOTED.  VALUE IS CONSISTENT WITH PREVIOUSLY REPORTED AND CALLED VALUE.    Culture (A)  Final    STAPHYLOCOCCUS AUREUS SUSCEPTIBILITIES PERFORMED ON PREVIOUS CULTURE WITHIN THE LAST 5 DAYS. Performed at [facility], [address] [place], [place] [address]    Report Status 08/22/2024 FINAL   Final  Culture, blood (Routine X 2) w Reflex to ID Panel     Status: Abnormal   Collection Time: 08/19/24 11:15 AM   Specimen: BLOOD LEFT ARM  Result Value Ref Range Status   Specimen Description BLOOD LEFT ARM  Final   Special Requests   Final    BOTTLES DRAWN AEROBIC ONLY Blood Culture results may not be optimal due to an inadequate volume of blood received in culture bottles   Culture  Setup Time   Final    GRAM POSITIVE COCCI IN CLUSTERS AEROBIC BOTTLE ONLY CRITICAL VALUE NOTED.  VALUE IS CONSISTENT WITH PREVIOUSLY REPORTED AND CALLED VALUE.    Culture (A)  Final    STAPHYLOCOCCUS AUREUS SUSCEPTIBILITIES PERFORMED ON PREVIOUS CULTURE WITHIN THE LAST 5 DAYS. Performed at [facility], [address], [place], [place] [address]    Report Status 08/22/2024 FINAL  Final  Culture, blood (Routine X 2) w Reflex to ID Panel     Status: Abnormal (Preliminary result)   Collection Time: 08/21/24 10:46 AM   Specimen: BLOOD  Result Value Ref Range Status   Specimen Description BLOOD BLOOD RIGHT ARM  Final   Special Requests   Final    BOTTLES DRAWN AEROBIC AND ANAEROBIC Blood Culture adequate volume   Culture  Setup Time   Final    GRAM POSITIVE COCCI ANAEROBIC BOTTLE ONLY CRITICAL VALUE NOTED.  VALUE IS CONSISTENT WITH PREVIOUSLY REPORTED AND CALLED VALUE.    Culture (A)  Final    STAPHYLOCOCCUS AUREUS SUSCEPTIBILITIES PERFORMED ON PREVIOUS CULTURE WITHIN THE LAST 5 DAYS. Performed at [facility], [address], [place], [place] [address]    Report Status PENDING  Incomplete  Culture, blood (Routine X 2) w Reflex to ID Panel     Status: None   Collection Time: 08/21/24 10:46 AM   Specimen: BLOOD  Result Value Ref Range Status   Specimen Description BLOOD BLOOD LEFT HAND  Final   Special Requests   Final    BOTTLES DRAWN AEROBIC AND ANAEROBIC Blood Culture results may not be optimal due to an inadequate volume of blood received in culture bottles   Culture   Final     NO GROWTH 5 DAYS Performed at [facility], [address] [place], [place] [address]    Report Status 08/26/2024 FINAL  Final  Culture, blood (Routine X 2) w Reflex to ID Panel     Status: None (Preliminary result)   Collection Time: 08/25/24 10:03 AM   Specimen: BLOOD  Result Value Ref Range Status   Specimen Description BLOOD BLOOD RIGHT HAND  Final   Special Requests   Final    AEROBIC BOTTLE ONLY Blood Culture results may not be optimal due to an inadequate volume of  blood received in culture bottles   Culture  Setup Time   Final    GRAM POSITIVE COCCI AEROBIC BOTTLE ONLY CRITICAL VALUE NOTED.  VALUE IS CONSISTENT WITH PREVIOUSLY REPORTED AND CALLED VALUE. Performed at [facility], [address] [place], [place] [address]    Culture GRAM POSITIVE COCCI  Final   Report Status PENDING  Incomplete  Culture, blood (Routine X 2) w Reflex to ID Panel     Status: None (Preliminary result)   Collection Time: 08/25/24 10:03 AM   Specimen: BLOOD  Result Value Ref Range Status   Specimen Description BLOOD BLOOD LEFT HAND  Final   Special Requests AEROBIC BOTTLE ONLY Blood Culture adequate volume  Final   Culture   Final    NO GROWTH < 24 HOURS Performed at [facility], [address], [place], [place] [address]    Report Status PENDING  Incomplete     Radiology Studies: No results found.    Nilda Fendt, MD, PhD [facility]  Between 7 am - 7 pm I am available, please contact me via Amion (for emergencies) or Securechat (non urgent messages)  Between 7 pm - 7 am I am not available, please contact night coverage MD/APP via Amion  "

## 2024-08-26 NOTE — Progress Notes (Signed)
 PHARMACY - ANTICOAGULATION CONSULT NOTE  Pharmacy Consult for Heparin  Indication: DVT, bilateral upper extremities  Allergies[1]  Patient Measurements: Height: 5' 1 (154.9 cm) Weight: 81.4 kg (179 lb 7.3 oz) IBW/kg (Calculated) : 47.8 HEPARIN  DW (KG): 65.4  Vital Signs: Temp: 97.5 F (36.4 C) (01/03 2024) Temp Source: Oral (01/03 2024) BP: 133/75 (01/03 2024) Pulse Rate: 90 (01/03 2024)  Labs: Recent Labs    08/24/24 0522 08/25/24 2302 08/26/24 1120 08/26/24 2105  HGB 12.8  --  12.5  --   HCT 38.8  --  37.2  --   PLT 228  --  270  --   HEPARINUNFRC  --  0.14* 0.20* 0.25*  CREATININE 0.71  --  0.73  --     Estimated Creatinine Clearance: 58.7 mL/min (by C-G formula based on SCr of 0.73 mg/dL).   Medical History: Past Medical History:  Diagnosis Date   Alkaline phosphatase elevation    Asthma    Breast cancer (HCC) 2011   Carpal tunnel syndrome    bilateral   Chronic HFrEF (heart failure with reduced ejection fraction) (HCC)    Cirrhosis, non-alcoholic (HCC)    Closed fracture of unspecified part of radius (alone)    DDD (degenerative disc disease), lumbar    Depression    pt denies   Diabetes mellitus without complication (HCC)    Enlarged heart    Fibromyalgia    GERD (gastroesophageal reflux disease)    History of kidney stones    HTN (hypertension)    Hypothyroidism    IBS (irritable bowel syndrome)    LBBB (left bundle branch block)    Microcytic anemia 05/14/2017   Mitral regurgitation    NICM (nonischemic cardiomyopathy) (HCC)    Pneumonia    Presence of permanent cardiac pacemaker    Pulmonary hypertension (HCC)    PVD (peripheral vascular disease)    2019   Sleep apnea    uses cpap   Tricuspid regurgitation    Ventricular tachyarrhythmia [facility])    Assessment: 74 yr old female to begin IV heparin  for bilateral upper extremity DVT per duplex. Has been on Heparin  5000 units SQ Q8hrs for VTE prophylaxis.  Last dose 1/2 0522am. Acute  generalized weakness on 1/1 and Code Stroke called. CT of head showed small age indeterminate lacunar infarct in right thalamus. Noted Neuro/Stroke team dose note feel this was a stroke and repeat CT was negative.  Heparin  level this morning is below goal at 0.20 on 1050 units/hr. Level was drawn 3.5 hours late, so likely falsely low. Will increase dose conservatively. CBC stable. No bleeding or issues with infusion per RN.   PM update: heparin  level 0.25 is subtherapeutic on 1150 units/hr. No issues with the infusion or bleeding reported.  Goal of Therapy:  Heparin  level 0.3-0.5 units/ml Monitor platelets by anticoagulation protocol: Yes   Plan:  Increase IV heparin  gtt to 1300 units/hr. Repeat heparin  level in 8 hrs Daily heparin  level and CBC.  Rocky Slade, PharmD, BCPS 08/26/2024 9:49 PM  Please check AMION for all [facility] Pharmacy phone numbers After 10:00 PM, call Main Pharmacy [phone]            [1]  Allergies Allergen Reactions   Injectafer [Ferric Carboxymaltose] Shortness Of Breath    Mild SOB following injectafer infusion    Penicillins Anaphylaxis and Rash    Tolerated ceftin  2020   Accupril [Quinapril Hcl] Other (See Comments)    Hyperkalemia=high potassium level   Celebrex [Celecoxib] Other (See Comments)  Hx hematemesis, bleeding ulcer   Cozaar  [Losartan  Potassium] Itching   Dml Forte Rash   Entresto [Sacubitril-Valsartan] Other (See Comments)    Hyperkalemia   Flonase  [Fluticasone ] Other (See Comments)    Hx nasal ulcer   Lactose Intolerance (Gi) Diarrhea   Lipitor [Atorvastatin ] Other (See Comments)    Myalgias    Nasonex  [Mometasone  Furoate] Other (See Comments)    Epistaxis    Nsaids Other (See Comments)    Hx hematemesis, bleeding ulcer   Advil [Ibuprofen] Nausea And Vomiting    Hx hematemesis, bleeding ulcer   Baking Soda-Fluoride [Sodium Fluoride] Itching and Rash   Bayer Aspirin  [Aspirin ] Other (See Comments)    Hx hematemesis,  bleeding ulcer   Chamomile Hives   Clindamycin /Lincomycin Rash   Codeine Nausea And Vomiting   Lasix  [Furosemide ] Other (See Comments)    Tinnitus  Hypotension   Miralax  [Polyethylene Glycol (Macrogol)] Other (See Comments)    Leaky gut syndrome   Quinine Rash   Reclast  [Zoledronic  Acid] Rash and Other (See Comments)    Tremors    Rifadin [Rifampin] Other (See Comments)    Unknown reaction   Singulair  [Montelukast ] Other (See Comments)    Dizziness    Sulfa Antibiotics Rash   Ultram  [Tramadol ] Anxiety and Rash   Vesicare  [Solifenacin ] Other (See Comments)    Urinary retention   Wellbutrin  [Bupropion ] Other (See Comments)    Myalgias  Skin crawling sensation   Wool Alcohol  [Lanolin] Rash

## 2024-08-26 NOTE — Progress Notes (Signed)
 "                                                                                                                                                                                                       Daily Progress Note   Patient Name: Tanya Hogan       Date: 08/26/2024 DOB: [birth year]  Age: 74 y.o. MRN#: [id number] Attending Physician: Trixie Nilda HERO, MD Primary Care Physician: [name], MD Admit Date: 08/16/2024  Reason for Consultation/Follow-up: Establishing goals of care  Subjective: Medical records reviewed including progress notes, labs, imaging. Patient assessed at the bedside.  She is quite emotional today.  Having a hard time with poor appetite, oral pain, needing to use the restroom.  Discussed with RN who was already present assisting.  No family present.  Created space and opportunity for patient's thoughts and feelings on her current illness, particularly her discussion with EP physician yesterday evening.  She tells me that nothing was discussed that she did not already know.  She understands that she should not undergo the extraction.  She continues to  desire suppressive antibiotics and not yet ready for hospice philosophy.  She understands she can change her mind at any time.  I shared that I would be back on service on Tuesday and encouraged her to reach out to PMT should needs arise in the meantime.  Questions and concerns addressed. PMT will continue to support holistically.   Length of Stay: 10   Physical Exam Vitals and nursing note reviewed.  Constitutional:      General: She is not in acute distress.    Appearance: She is ill-appearing.  HENT:     Head: Normocephalic and atraumatic.  Cardiovascular:     Rate and Rhythm: Normal rate.  Pulmonary:     Effort: Pulmonary effort is normal.  Musculoskeletal:     Left hand: Swelling present.  Skin:    General: Skin is dry.     Findings: Petechiae present.     Comments: Diffuse rash over bilateral  feet  Neurological:     Mental Status: She is alert and oriented to person, place, and time. Mental status is at baseline.  Psychiatric:        Mood and Affect: Affect is labile.        Behavior: Behavior normal.        Cognition and Memory: Cognition normal.            Vital Signs: BP 114/61 (BP Location: Left Arm)   Pulse 90   Temp 97.6 F (  36.4 C) (Axillary)   Resp (!) 21   Ht 5' 1 (1.549 m)   Wt 81.4 kg   SpO2 91%   BMI 33.91 kg/m  SpO2: SpO2: 91 % O2 Device: O2 Device: CPAP O2 Flow Rate: O2 Flow Rate (L/min): 2 L/min  Palliative Care Assessment & Plan   Patient Profile: 74 y.o. female  with past medical history of recent revision of pacemaker/AICD, breast cancer, end stage chronic heart failure, nonalcoholic cirrhosis, BiV pacemake, depression, DM2, GERD, HTN, hypothyroidism, valvular heart disease, pulmonary hypertension, sleep apnea and ventricular tachy arrhythmia  admitted on 08/16/2024 with swelling and difficulty breathing.    Admitted for acute on chronic HF. On 12/25 she became minimally responsive requiring BiPAP. She was found to have MRSA bacteremia.   She has six inpatient hospitalizations in the last six months.   Recommendations/Plan: Continue DNR/DNI Continue current care plan Patient understands she is not a candidate for extraction and that the medical team is concerned suppressive antibiotics will not be effective.  She continues to desire a trial of this treatment and not yet ready for hospice Ongoing GOC discussions PMT will continue to follow and support.  I will be back on service Tuesday 1/6.  Please call team line if needs arise in the meantime   Care plan was discussed with patient, bedside RN, primary MD   Mickle Fell, PA-C Palliative Medicine Team Team phone # [phone]  Thank you for allowing the Palliative Medicine Team to assist in the care of this patient. Please utilize secure chat with additional questions, if there is no  response within 30 minutes please call the above phone number.  Palliative Medicine Team providers are available by phone from 7am to 7pm daily and can be reached through the team cell phone.  Should this patient require assistance outside of these hours, please call the patient's attending physician.   Time Total: 35  Visit consisted of counseling and education dealing with the complex and emotionally intense issues of symptom management and palliative care in the setting of serious and potentially life-threatening illness. Greater than 50% of this time was spent counseling and coordinating care related to the above assessment and plan.  Personally spent 35 minutes in patient care including extensive chart review (labs, imaging, progress/consult notes, vital signs), medically appropraite exam, discussed with treatment team, education to patient, family, and staff, documenting clinical information, medication review and management, coordination of care, and available advanced directive documents.    "

## 2024-08-27 ENCOUNTER — Inpatient Hospital Stay (HOSPITAL_COMMUNITY)

## 2024-08-27 ENCOUNTER — Other Ambulatory Visit (HOSPITAL_COMMUNITY): Payer: Self-pay

## 2024-08-27 DIAGNOSIS — I4729 Other ventricular tachycardia: Secondary | ICD-10-CM

## 2024-08-27 DIAGNOSIS — I428 Other cardiomyopathies: Secondary | ICD-10-CM | POA: Diagnosis not present

## 2024-08-27 DIAGNOSIS — R7881 Bacteremia: Secondary | ICD-10-CM | POA: Diagnosis not present

## 2024-08-27 DIAGNOSIS — I5043 Acute on chronic combined systolic (congestive) and diastolic (congestive) heart failure: Secondary | ICD-10-CM

## 2024-08-27 LAB — BASIC METABOLIC PANEL WITH GFR
Anion gap: 11 (ref 5–15)
BUN: 18 mg/dL (ref 8–23)
CO2: 22 mmol/L (ref 22–32)
Calcium: 9.1 mg/dL (ref 8.9–10.3)
Chloride: 97 mmol/L — ABNORMAL LOW (ref 98–111)
Creatinine, Ser: 0.92 mg/dL (ref 0.44–1.00)
GFR, Estimated: >60 mL/min
Glucose, Bld: 174 mg/dL — ABNORMAL HIGH (ref 70–99)
Potassium: 4.5 mmol/L (ref 3.5–5.1)
Sodium: 130 mmol/L — ABNORMAL LOW (ref 135–145)

## 2024-08-27 LAB — CBC
HCT: 35.7 % — ABNORMAL LOW (ref 36.0–46.0)
Hemoglobin: 11.7 g/dL — ABNORMAL LOW (ref 12.0–15.0)
MCH: 31 pg (ref 26.0–34.0)
MCHC: 32.8 g/dL (ref 30.0–36.0)
MCV: 94.4 fL (ref 80.0–100.0)
Platelets: 233 K/uL (ref 150–400)
RBC: 3.78 MIL/uL — ABNORMAL LOW (ref 3.87–5.11)
RDW: 15.7 % — ABNORMAL HIGH (ref 11.5–15.5)
WBC: 12.8 K/uL — ABNORMAL HIGH (ref 4.0–10.5)
nRBC: 0 % (ref 0.0–0.2)

## 2024-08-27 LAB — GLUCOSE, CAPILLARY
Glucose-Capillary: 121 mg/dL — ABNORMAL HIGH (ref 70–99)
Glucose-Capillary: 129 mg/dL — ABNORMAL HIGH (ref 70–99)
Glucose-Capillary: 102 mg/dL — ABNORMAL HIGH (ref 70–99)
Glucose-Capillary: 101 mg/dL — ABNORMAL HIGH (ref 70–99)

## 2024-08-27 LAB — HEPARIN LEVEL (UNFRACTIONATED): Heparin Unfractionated: 0.28 [IU]/mL — ABNORMAL LOW (ref 0.30–0.70)

## 2024-08-27 LAB — PRO BRAIN NATRIURETIC PEPTIDE: Pro Brain Natriuretic Peptide: 5797 pg/mL — ABNORMAL HIGH

## 2024-08-27 MED ORDER — APIXABAN 2.5 MG PO TABS
10.0000 mg | ORAL_TABLET | Freq: Two times a day (BID) | ORAL | Status: DC
  Administered 2024-08-27 – 2024-08-29 (×5): 10 mg via ORAL
  Filled 2024-08-27 (×5): qty 4

## 2024-08-27 MED ORDER — APIXABAN 2.5 MG PO TABS: 5.0000 mg | ORAL_TABLET | Freq: Two times a day (BID) | ORAL | Status: DC

## 2024-08-27 NOTE — Discharge Instructions (Signed)
 Information on my medicine - ELIQUIS  (apixaban )  This medication education was reviewed with me or my healthcare representative as part of my discharge preparation.   Why was Eliquis  prescribed for you? Eliquis  was prescribed to treat blood clots that may have been found in the veins of your legs (deep vein thrombosis) or in your lungs (pulmonary embolism) and to reduce the risk of them occurring again.  What do You need to know about Eliquis  ? The starting dose is 10 mg (two 5 mg tablets) taken TWICE daily for the FIRST SEVEN (7) DAYS, then on (enter date)  09/12/2024 (Sunday) the dose is reduced to ONE 5 mg tablet taken TWICE daily.  Eliquis  may be taken with or without food.   Try to take the dose about the same time in the morning and in the evening. If you have difficulty swallowing the tablet whole please discuss with your pharmacist how to take the medication safely.  Take Eliquis  exactly as prescribed and DO NOT stop taking Eliquis  without talking to the doctor who prescribed the medication.  Stopping may increase your risk of developing a new blood clot.  Refill your prescription before you run out.  After discharge, you should have regular check-up appointments with your healthcare provider that is prescribing your Eliquis .    What do you do if you miss a dose? If a dose of ELIQUIS  is not taken at the scheduled time, take it as soon as possible on the same day and twice-daily administration should be resumed. The dose should not be doubled to make up for a missed dose.  Important Safety Information A possible side effect of Eliquis  is bleeding. You should call your healthcare provider right away if you experience any of the following: Bleeding from an injury or your nose that does not stop. Unusual colored urine (red or dark brown) or unusual colored stools (red or black). Unusual bruising for unknown reasons. A serious fall or if you hit your head (even if there is no  bleeding).  Some medicines may interact with Eliquis  and might increase your risk of bleeding or clotting while on Eliquis . To help avoid this, consult your healthcare provider or pharmacist prior to using any new prescription or non-prescription medications, including herbals, vitamins, non-steroidal anti-inflammatory drugs (NSAIDs) and supplements.  This website has more information on Eliquis  (apixaban ): http://[email or web address] .com/eliquis dena

## 2024-08-27 NOTE — Plan of Care (Signed)
" °  Problem: Education: Goal: Knowledge of General Education information will improve Description: Including pain rating scale, medication(s)/side effects and non-pharmacologic comfort measures Outcome: Progressing   Problem: Clinical Measurements: Goal: Ability to maintain clinical measurements within normal limits will improve Outcome: Progressing Goal: Respiratory complications will improve Outcome: Progressing Goal: Cardiovascular complication will be avoided Outcome: Progressing   Problem: Activity: Goal: Risk for activity intolerance will decrease Outcome: Progressing   Problem: Pain Managment: Goal: General experience of comfort will improve and/or be controlled Outcome: Progressing   Problem: Safety: Goal: Ability to remain free from injury will improve Outcome: Progressing   Problem: Skin Integrity: Goal: Risk for impaired skin integrity will decrease Outcome: Progressing   Problem: Fluid Volume: Goal: Ability to maintain a balanced intake and output will improve Outcome: Progressing   Problem: Metabolic: Goal: Ability to maintain appropriate glucose levels will improve Outcome: Progressing   "

## 2024-08-27 NOTE — Progress Notes (Addendum)
 PHARMACY - ANTICOAGULATION CONSULT NOTE  Pharmacy Consult for Heparin  Indication: DVT, bilateral upper extremities  Allergies[1]  Patient Measurements: Height: 5' 1 (154.9 cm) Weight: 81.2 kg (179 lb) IBW/kg (Calculated) : 47.8 HEPARIN  DW (KG): 65.4  Vital Signs: Temp: 98 F (36.7 C) (01/04 0236) Temp Source: Oral (01/04 0236) BP: 95/68 (01/04 0236) Pulse Rate: 88 (01/04 0236)  Labs: Recent Labs    08/25/24 2302 08/26/24 1120 08/26/24 2105  HGB  --  12.5  --   HCT  --  37.2  --   PLT  --  270  --   HEPARINUNFRC 0.14* 0.20* 0.25*  CREATININE  --  0.73  --     Estimated Creatinine Clearance: 58.7 mL/min (by C-G formula based on SCr of 0.73 mg/dL).   Medical History: Past Medical History:  Diagnosis Date   Alkaline phosphatase elevation    Asthma    Breast cancer (HCC) 2011   Carpal tunnel syndrome    bilateral   Chronic HFrEF (heart failure with reduced ejection fraction) (HCC)    Cirrhosis, non-alcoholic (HCC)    Closed fracture of unspecified part of radius (alone)    DDD (degenerative disc disease), lumbar    Depression    pt denies   Diabetes mellitus without complication (HCC)    Enlarged heart    Fibromyalgia    GERD (gastroesophageal reflux disease)    History of kidney stones    HTN (hypertension)    Hypothyroidism    IBS (irritable bowel syndrome)    LBBB (left bundle branch block)    Microcytic anemia 05/14/2017   Mitral regurgitation    NICM (nonischemic cardiomyopathy) (HCC)    Pneumonia    Presence of permanent cardiac pacemaker    Pulmonary hypertension (HCC)    PVD (peripheral vascular disease)    2019   Sleep apnea    uses cpap   Tricuspid regurgitation    Ventricular tachyarrhythmia [facility])    Assessment: 74 yr old female to begin IV heparin  for bilateral upper extremity DVT per duplex. Has been on Heparin  5000 units SQ Q8hrs for VTE prophylaxis.  Last dose 1/2 0522am. Acute generalized weakness on 1/1 and Code Stroke called. CT  of head showed small age indeterminate lacunar infarct in right thalamus. Noted Neuro/Stroke team dose note feel this was a stroke and repeat CT was negative.  Heparin  level still subtherapeutic at 0.28 on 1300 units/hr. Hgb, PLT stable. No bleeding reported or issues with infusion per RN.   Goal of Therapy:  Heparin  level 0.3-0.5 units/ml Monitor platelets by anticoagulation protocol: Yes   Plan:  Increase IV heparin  gtt to 1450 units/hr Repeat heparin  level in 8 hrs Daily heparin  level and CBC. F/u when to transition to DOAC  Izetta Carl, PharmD PGY1 Pharmacy Resident  ----- [facility] 08/27/2024 11:10 AM  Transitioning to Eliquis  for bilateral DVT treatment. Discussed with Dr. Rosemarie about loading dose given code stroke on 1/1 -- believed it not to be a stroke, so will proceed with full loading dose.   Initiate Eliquis  10 mg PO BID x7d, followed by 5 mg PO BID.   Izetta Carl, PharmD PGY1 Pharmacy Resident         [1]  Allergies Allergen Reactions   Pressley [Ferric Carboxymaltose] Shortness Of Breath    Mild SOB following injectafer infusion    Penicillins Anaphylaxis and Rash    Tolerated ceftin  2020   Accupril [Quinapril Hcl] Other (See Comments)    Hyperkalemia=high potassium level   Celebrex [  Celecoxib] Other (See Comments)    Hx hematemesis, bleeding ulcer   Cozaar  [Losartan  Potassium] Itching   Dml Forte Rash   Entresto [Sacubitril-Valsartan] Other (See Comments)    Hyperkalemia   Flonase  [Fluticasone ] Other (See Comments)    Hx nasal ulcer   Lactose Intolerance (Gi) Diarrhea   Lipitor [Atorvastatin ] Other (See Comments)    Myalgias    Nasonex  [Mometasone  Furoate] Other (See Comments)    Epistaxis    Nsaids Other (See Comments)    Hx hematemesis, bleeding ulcer   Advil [Ibuprofen] Nausea And Vomiting    Hx hematemesis, bleeding ulcer   Baking Soda-Fluoride [Sodium Fluoride] Itching and Rash   Bayer Aspirin  [Aspirin ] Other (See Comments)    Hx  hematemesis, bleeding ulcer   Chamomile Hives   Clindamycin /Lincomycin Rash   Codeine Nausea And Vomiting   Lasix  [Furosemide ] Other (See Comments)    Tinnitus  Hypotension   Miralax  [Polyethylene Glycol (Macrogol)] Other (See Comments)    Leaky gut syndrome   Quinine Rash   Reclast  [Zoledronic  Acid] Rash and Other (See Comments)    Tremors    Rifadin [Rifampin] Other (See Comments)    Unknown reaction   Singulair  [Montelukast ] Other (See Comments)    Dizziness    Sulfa Antibiotics Rash   Ultram  [Tramadol ] Anxiety and Rash   Vesicare  [Solifenacin ] Other (See Comments)    Urinary retention   Wellbutrin  [Bupropion ] Other (See Comments)    Myalgias  Skin crawling sensation   Wool Alcohol  [Lanolin] Rash

## 2024-08-27 NOTE — Progress Notes (Signed)
 " PROGRESS NOTE  Tanya Hogan FMW:[id number] DOB: 1951-07-27 DOA: 08/16/2024 PCP: [name], MD   LOS: 11 days   Brief Narrative / Interim history: 74 year old female with history of chronic systolic CHF, HTN, PVD, DM2, presence of pacemaker, hypothyroidism comes into the hospital with shortness of breath.  Of note, she was recently hospitalized mid-December for CHF exacerbation, sent home after diuresis, but developed recurrent dyspnea and lower extremity edema at home and came back to the ED.  She was found to have MRSA bacteremia.  ID consulted and following.  Due to presence of pacemaker cardiology consulted as well.  She is deemed to be a very poor candidate for pacer lead extraction given end-stage heart failure and poor prognosis.  Palliative consulted as well  Subjective / 24h Interval events: She is having less generalized pain, seems to be in better spirits today.  She states that she is feeling overall better  Assesement and Plan: Principal problem MRSA bacteremia -ID following, appreciate input.  Currently she is on daptomycin .  She has a pacemaker in place but not a candidate to remove so likely needs suppressive prolonged antibiotics - A TEE was not pursued due to high risk.  Blood cultures were positive for MRSA on 12/25 and 12/27, and surveillance cultures on 12/29 initially appeared to be negative, now unfortunately 1 out of 4 bottles are still showing MRSA.  This is somewhat of a poor prognosis as she may not be able to completely clear her bacteremia without pacemaker removal.  EP reevaluated patient 1/2, she is not a candidate for pacemaker and lead removal - Palliative care consulted as well - Repeat surveillance cultures were done on 1/2, currently without growth, monitor - Appreciate ID follow-up  Active problems Acute generalized weakness -this is happening on 08/24/2024.  A code stroke was called, she was evaluated at the bedside by the neurology team.  A CT  of the head showed a small age-indeterminate lacunar infarct in the right thalamus.  Neurology/stroke team evaluated patient today, discussed at bedside with Dr. Rosemarie, he does not really think this was a stroke.  Skin rash -in the setting of MRSA bacteremia, possibly IgA vasculitis.  Overall improving  Acute on chronic systolic CHF -most recent 2D echo showed LVEF less than 20%, global hypokinesis, severe LV dilatation, grade 3 diastolic dysfunction, RV systolic function moderate reduction, RVSP 48, biatrial severe dilatation and small pericardial effusion.  She was diuresed - Volume status slightly up.  Cardiology following, diuretics  CHB-with pacemaker.  Recently had subsequent addition of RV-the lead on October 2025 for ATP for refractory VT  History of VT-no arrhythmias.  Continue suppressive treatment with amiodarone  as well as quinidine  Hypothyroidism -continue Synthroid   Essential hypertension-continue spironolactone   NASH cirrhosis-no signs of decompensation  Underlying COPD-no wheezing, stable, continue bronchodilator therapy  History of left breast cancer-outpatient follow-up, in remission  Microcytic anemia-continue to monitor, no bleeding  Hyponatremia-overall stable, no large shifts noted.  Sodium 130  Obesity, class I-BMI 33.3.    Scheduled Meds:  amiodarone   200 mg Oral Daily   apixaban   10 mg Oral BID   Followed by   NOREEN ON 08/26/2024] apixaban   5 mg Oral BID   arformoterol   15 mcg Nebulization BID   aspirin   81 mg Oral Daily   budesonide  (PULMICORT ) nebulizer solution  0.25 mg Nebulization BID   Chlorhexidine  Gluconate Cloth  6 each Topical Daily   insulin  aspart  0-5 Units Subcutaneous QHS   insulin   aspart  0-9 Units Subcutaneous TID WC   lactobacillus  1 g Oral TID WC   levothyroxine   112 mcg Oral Daily   multivitamin with minerals  1 tablet Oral Daily   nystatin   5 mL Oral QID   mouth rinse  15 mL Mouth Rinse 4 times per day   pantoprazole   40 mg  Oral QAC breakfast   pravastatin   80 mg Oral QHS   quiNIDine sulfate   200 mg Oral TID   revefenacin   175 mcg Nebulization Daily   sodium chloride  flush  3 mL Intravenous Q12H   sodium chloride  flush  3 mL Intravenous Q12H   spironolactone   12.5 mg Oral Daily   torsemide   20 mg Oral Daily   Continuous Infusions:  DAPTOmycin  500 mg (08/27/24 1302)   PRN Meds:.acetaminophen  **OR** acetaminophen , HYDROcodone -acetaminophen , levalbuterol , ondansetron  **OR** ondansetron  (ZOFRAN ) IV, mouth rinse, senna-docusate, traZODone   Current Outpatient Medications  Medication Instructions   albuterol  (PROVENTIL ) 2.5 mg, Nebulization, Daily PRN   albuterol  (VENTOLIN  HFA) 108 (90 Base) MCG/ACT inhaler 2 puffs, Inhalation, Every 6 hours PRN   alendronate  (FOSAMAX ) 70 MG tablet TAKE 1 TABLET BY MOUTH EVERY 7  DAYS WITH FULL GLASS OF WATER  ON AN EMPTY STOMACH   amiodarone  (PACERONE ) 200 mg, Oral, Daily   aspirin  81 mg, Oral, Daily   Calcium  Carbonate-Vit D-Min (CALCIUM  600+D PLUS MINERALS) 600-400 MG-UNIT TABS 1 tab p.o. twice daily   EPINEPHrine  (EPI-PEN) 0.3 mg, Intramuscular, As needed   fexofenadine  (ALLEGRA ) 180 mg, Oral, Daily   fluticasone -salmeterol (WIXELA INHUB) 250-50 MCG/ACT AEPB 1 puff, Inhalation, 2 times daily   levothyroxine  (SYNTHROID ) 112 mcg, Oral, Daily   metFORMIN  (GLUCOPHAGE -XR) 500 mg, Oral, Daily, Per 08/09/24 discharge states 1 tablet twice daily - patient states PCP has her on this once daily and they plan to discuss once or twice daily at 08/14/24 appt   Multiple Vitamins-Minerals (MULTIVITAMIN WOMEN 50+) TABS 1 tablet, Daily   pantoprazole  (PROTONIX ) 40 mg, Oral, Daily before breakfast   pravastatin  (PRAVACHOL ) 40 mg, Oral, Daily at bedtime   quiNIDine sulfate  200 mg, Oral, 3 times daily   SPIRIVA  RESPIMAT 2.5 MCG/ACT AERS 2 puffs, Inhalation, Daily   spironolactone  (ALDACTONE ) 12.5 mg, Oral, Daily   torsemide  (DEMADEX ) 20 mg, Oral, Daily, Take 2 tablets daily in case of weight  gain 2 to 3 lbs in 24 hrs or 5 lbs in 7 days   valACYclovir  (VALTREX ) 1,000 mg, Oral, 2 times daily    Diet Orders (From admission, onward)     Start     Ordered   08/20/24 1220  Diet Heart Room service appropriate? Yes with Assist; Fluid consistency: Thin  Diet effective now       Question Answer Comment  Room service appropriate? Yes with Assist   Fluid consistency: Thin      08/20/24 1219            DVT prophylaxis: apixaban  (ELIQUIS ) tablet 5 mg Start: 09/07/2024 1000 apixaban  (ELIQUIS ) tablet 10 mg Start: 08/27/24 1230 SCDs Start: 08/16/24 1020 apixaban  (ELIQUIS ) tablet 10 mg  apixaban  (ELIQUIS ) tablet 5 mg   Lab Results  Component Value Date   PLT 233 08/27/2024      Code Status: Limited: Do not attempt resuscitation (DNR) -DNR-LIMITED -Do Not Intubate/DNI   Family Communication: No family at bedside  Status is: Inpatient Remains inpatient appropriate because: Severity of illness   Level of care: Telemetry  Consultants:  ID Cardiology  Objective: Vitals:   08/27/24 9170 08/27/24 9141  08/27/24 1204 08/27/24 1215  BP: 117/67  (!) 111/54   Pulse: 89  88 88  Resp: 20  (!) 32 20  Temp:   97.8 F (36.6 C)   TempSrc:   Oral   SpO2: 97% 95% 96% 95%  Weight:      Height:        Intake/Output Summary (Last 24 hours) at 08/27/2024 1307 Last data filed at 08/27/2024 9373 Gross per 24 hour  Intake 749.2 ml  Output 100 ml  Net 649.2 ml    Wt Readings from Last 3 Encounters:  08/27/24 81.2 kg  08/14/24 81.2 kg  08/09/24 79.4 kg    Examination:  Constitutional: NAD Eyes: lids and conjunctivae normal, no scleral icterus ENMT: mmm Neck: normal, supple Respiratory: Faint bibasilar rhonchi, normal respiratory effort Cardiovascular: Regular rate and rhythm, no murmurs / rubs / gallops.  1+ LE edema. Abdomen: soft, no distention, no tenderness. Bowel sounds positive.   Data Reviewed: I have independently reviewed following labs and imaging studies    CBC Recent Labs  Lab 08/23/24 0452 08/23/24 0852 08/24/24 0522 08/26/24 1120 08/27/24 0737  WBC 10.2 10.6* 11.9* 17.5* 12.8*  HGB 13.7 13.7 12.8 12.5 11.7*  HCT 41.4 41.3 38.8 37.2 35.7*  PLT 224 252 228 270 233  MCV 94.7 92.8 92.6 91.9 94.4  MCH 31.4 30.8 30.5 30.9 31.0  MCHC 33.1 33.2 33.0 33.6 32.8  RDW 14.7 14.7 14.9 15.2 15.7*  LYMPHSABS  --  1.3  --   --   --   MONOABS  --  0.5  --   --   --   EOSABS  --  0.1  --   --   --   BASOSABS  --  0.1  --   --   --     Recent Labs  Lab 08/21/24 0400 08/22/24 0516 08/23/24 0852 08/24/24 0522 08/26/24 1120 08/27/24 0737  NA 134* 133* 132* 133* 129* 130*  K 4.1 4.3 4.6 4.8 5.1 4.5  CL 101 100 97* 99 97* 97*  CO2 24 25 27 26 24 22   GLUCOSE 108* 112* 120* 117* 128* 174*  BUN 13 9 9 12 13 18   CREATININE 0.58 0.65 0.70 0.71 0.73 0.92  CALCIUM  9.5 9.5 9.8 9.9 9.6 9.1  AST 114* 112*  --  83* 88*  --   ALT 77* 73*  --  55* 48*  --   ALKPHOS 206* 234*  --  205* 209*  --   BILITOT 0.6 0.6  --  0.7 1.1  --   ALBUMIN 2.8* 2.7*  --  2.9* 2.9*  --   MG 2.0  --   --  1.9 2.1  --     ------------------------------------------------------------------------------------------------------------------ Recent Labs    08/25/24 1003  CHOL 123  HDL 32*  LDLCALC 69  TRIG 892  CHOLHDL 3.8    Lab Results  Component Value Date   HGBA1C 5.9 (H) 08/17/2024   ------------------------------------------------------------------------------------------------------------------ No results for input(s): TSH, T4TOTAL, T3FREE, THYROIDAB in the last 72 hours.  Invalid input(s): FREET3  Cardiac Enzymes No results for input(s): CKMB, TROPONINI, MYOGLOBIN in the last 168 hours.  Invalid input(s): CK ------------------------------------------------------------------------------------------------------------------    Component Value Date/Time   BNP 1,744.7 (H) 08/02/2024 1942   BNP 193 (H) 08/10/2022 1158     CBG: Recent Labs  Lab 08/26/24 1147 08/26/24 1702 08/26/24 2056 08/27/24 0544 08/27/24 1209  GLUCAP 140* 117* 93 121* 129*    Recent Results (from the  past 240 hours)  MRSA Next Gen by PCR, Nasal     Status: Abnormal   Collection Time: 08/17/24  4:51 PM   Specimen: Nasal Mucosa; Nasal Swab  Result Value Ref Range Status   MRSA by PCR Next Gen DETECTED (A) NOT DETECTED Final    Comment: RESULT CALLED TO, READ BACK BY AND VERIFIED WITH: L BROWN RN 08/17/2024 @ 2142 BY AB (NOTE) The GeneXpert MRSA Assay (FDA approved for NASAL specimens only), is one component of a comprehensive MRSA colonization surveillance program. It is not intended to diagnose MRSA infection nor to guide or monitor treatment for MRSA infections. Test performance is not FDA approved in patients less than 92 years old. Performed at [facility], [address] [place], [place] [address]   Culture, blood (Routine X 2) w Reflex to ID Panel     Status: Abnormal   Collection Time: 08/17/24  5:58 PM   Specimen: BLOOD LEFT HAND  Result Value Ref Range Status   Specimen Description BLOOD LEFT HAND  Final   Special Requests   Final    BOTTLES DRAWN AEROBIC AND ANAEROBIC Blood Culture adequate volume   Culture  Setup Time   Final    GRAM POSITIVE COCCI IN BOTH AEROBIC AND ANAEROBIC BOTTLES CRITICAL RESULT CALLED TO, READ BACK BY AND VERIFIED WITH: PHARMD J.FRENS AT 0802 ON 08/18/2024 BY T.SAAD.    Culture (A)  Final    METHICILLIN RESISTANT STAPHYLOCOCCUS AUREUS Sent to [facility] for further susceptibility testing. Performed at [facility], [address] [place], [place] [address]    Report Status 08/21/2024 FINAL  Final   Organism ID, Bacteria METHICILLIN RESISTANT STAPHYLOCOCCUS AUREUS  Final      Susceptibility   Methicillin resistant staphylococcus aureus - MIC*    CIPROFLOXACIN  >=8 RESISTANT Resistant     ERYTHROMYCIN  >=8 RESISTANT Resistant     GENTAMICIN  <=0.5 SENSITIVE Sensitive      OXACILLIN >=4 RESISTANT Resistant     TETRACYCLINE <=1 SENSITIVE Sensitive     VANCOMYCIN  1 SENSITIVE Sensitive     TRIMETH/SULFA <=10 SENSITIVE Sensitive     CLINDAMYCIN  <=0.25 SENSITIVE Sensitive     RIFAMPIN <=0.5 SENSITIVE Sensitive     Inducible Clindamycin  NEGATIVE Sensitive     LINEZOLID 2 SENSITIVE Sensitive     * METHICILLIN RESISTANT STAPHYLOCOCCUS AUREUS  Blood Culture ID Panel (Reflexed)     Status: Abnormal   Collection Time: 08/17/24  5:58 PM  Result Value Ref Range Status   Enterococcus faecalis NOT DETECTED NOT DETECTED Final   Enterococcus Faecium NOT DETECTED NOT DETECTED Final   Listeria monocytogenes NOT DETECTED NOT DETECTED Final   Staphylococcus species DETECTED (A) NOT DETECTED Final    Comment: CRITICAL RESULT CALLED TO, READ BACK BY AND VERIFIED WITH: PHARMD J.FRENS AT 0802 ON 08/18/2024 BY T.SAAD.    Staphylococcus aureus (BCID) DETECTED (A) NOT DETECTED Final    Comment: Methicillin (oxacillin)-resistant Staphylococcus aureus (MRSA). MRSA is predictably resistant to beta-lactam antibiotics (except ceftaroline ). Preferred therapy is vancomycin  unless clinically contraindicated. Patient requires contact precautions if  hospitalized. CRITICAL RESULT CALLED TO, READ BACK BY AND VERIFIED WITH: PHARMD J.FRENS AT 0802 ON 08/18/2024 BY T.SAAD.    Staphylococcus epidermidis NOT DETECTED NOT DETECTED Final   Staphylococcus lugdunensis NOT DETECTED NOT DETECTED Final   Streptococcus species NOT DETECTED NOT DETECTED Final   Streptococcus agalactiae NOT DETECTED NOT DETECTED Final   Streptococcus pneumoniae NOT DETECTED NOT DETECTED Final   Streptococcus pyogenes NOT  DETECTED NOT DETECTED Final   A.calcoaceticus-baumannii NOT DETECTED NOT DETECTED Final   Bacteroides fragilis NOT DETECTED NOT DETECTED Final   Enterobacterales NOT DETECTED NOT DETECTED Final   Enterobacter cloacae complex NOT DETECTED NOT DETECTED Final   Escherichia coli NOT DETECTED NOT  DETECTED Final   Klebsiella aerogenes NOT DETECTED NOT DETECTED Final   Klebsiella oxytoca NOT DETECTED NOT DETECTED Final   Klebsiella pneumoniae NOT DETECTED NOT DETECTED Final   Proteus species NOT DETECTED NOT DETECTED Final   Salmonella species NOT DETECTED NOT DETECTED Final   Serratia marcescens NOT DETECTED NOT DETECTED Final   Haemophilus influenzae NOT DETECTED NOT DETECTED Final   Neisseria meningitidis NOT DETECTED NOT DETECTED Final   Pseudomonas aeruginosa NOT DETECTED NOT DETECTED Final   Stenotrophomonas maltophilia NOT DETECTED NOT DETECTED Final   Candida albicans NOT DETECTED NOT DETECTED Final   Candida auris NOT DETECTED NOT DETECTED Final   Candida glabrata NOT DETECTED NOT DETECTED Final   Candida krusei NOT DETECTED NOT DETECTED Final   Candida parapsilosis NOT DETECTED NOT DETECTED Final   Candida tropicalis NOT DETECTED NOT DETECTED Final   Cryptococcus neoformans/gattii NOT DETECTED NOT DETECTED Final   Meth resistant mecA/C and MREJ DETECTED (A) NOT DETECTED Final    Comment: CRITICAL RESULT CALLED TO, READ BACK BY AND VERIFIED WITH: PHARMD J.FRENS AT 0802 ON 08/18/2024 BY T.SAAD. Performed at [facility], [address], [place], [place] [address]   MIC (1 Drug)-     Status: Abnormal   Collection Time: 08/17/24  5:58 PM  Result Value Ref Range Status   Min Inhibitory Conc (1 Drug) Final report (A)  Corrected    Comment: (NOTE) Performed At: [facility] [address] [place], [place] [id number] Jennette Shorter MD Ey:[id number] CORRECTED ON 01/02 AT 1335: PREVIOUSLY REPORTED AS Preliminary report    Source LAB [address] MRSA DAPTO BLOOS  Corrected    Comment: Performed at [facility], [address] [place], [place] [address] CORRECTED ON 12/30 AT 0800: PREVIOUSLY REPORTED AS BLOOD   MIC Result     Status: Abnormal   Collection Time: 08/17/24  5:58 PM  Result Value Ref Range Status   Result 1 (MIC) Comment (A)  Final    Comment:  (NOTE) Methicillin - resistant Staphylococcus aureus Identification performed by account, not confirmed by this laboratory. Testing performed by broth microdilution. DAPTOMYCIN   0.5 ug/mL  SUSCEPTIBLE Performed At: [facility] [address] [place], [place] [id number] Jennette Shorter MD Ey:[id number]   Culture, blood (Routine X 2) w Reflex to ID Panel     Status: Abnormal   Collection Time: 08/17/24  6:01 PM   Specimen: BLOOD RIGHT HAND  Result Value Ref Range Status   Specimen Description BLOOD RIGHT HAND  Final   Special Requests   Final    BOTTLES DRAWN AEROBIC AND ANAEROBIC Blood Culture results may not be optimal due to an inadequate volume of blood received in culture bottles   Culture  Setup Time   Final    GRAM POSITIVE COCCI IN BOTH AEROBIC AND ANAEROBIC BOTTLES CRITICAL VALUE NOTED.  VALUE IS CONSISTENT WITH PREVIOUSLY REPORTED AND CALLED VALUE.    Culture (A)  Final    STAPHYLOCOCCUS AUREUS SUSCEPTIBILITIES PERFORMED ON PREVIOUS CULTURE WITHIN THE LAST 5 DAYS. Performed at [facility], [address] [place], [place] [address]    Report Status 08/21/2024 FINAL  Final  Culture, blood (Routine X 2) w Reflex to ID  Panel     Status: Abnormal   Collection Time: 08/19/24 11:15 AM   Specimen: BLOOD  Result Value Ref Range Status   Specimen Description BLOOD BLOOD RIGHT ARM  Final   Special Requests   Final    BOTTLES DRAWN AEROBIC AND ANAEROBIC Blood Culture results may not be optimal due to an inadequate volume of blood received in culture bottles   Culture  Setup Time   Final    GRAM POSITIVE COCCI IN CLUSTERS IN BOTH AEROBIC AND ANAEROBIC BOTTLES Gram Stain Report Called to,Read Back By and Verified With: PHARMD G ABBOTT 08/20/2024 @ 0552 BY AB CRITICAL VALUE NOTED.  VALUE IS CONSISTENT WITH PREVIOUSLY REPORTED AND CALLED VALUE.    Culture (A)  Final    STAPHYLOCOCCUS AUREUS SUSCEPTIBILITIES PERFORMED ON PREVIOUS CULTURE WITHIN THE LAST 5  DAYS. Performed at [facility], [address], [place], [place] [address]    Report Status 08/22/2024 FINAL  Final  Culture, blood (Routine X 2) w Reflex to ID Panel     Status: Abnormal   Collection Time: 08/19/24 11:15 AM   Specimen: BLOOD LEFT ARM  Result Value Ref Range Status   Specimen Description BLOOD LEFT ARM  Final   Special Requests   Final    BOTTLES DRAWN AEROBIC ONLY Blood Culture results may not be optimal due to an inadequate volume of blood received in culture bottles   Culture  Setup Time   Final    GRAM POSITIVE COCCI IN CLUSTERS AEROBIC BOTTLE ONLY CRITICAL VALUE NOTED.  VALUE IS CONSISTENT WITH PREVIOUSLY REPORTED AND CALLED VALUE.    Culture (A)  Final    STAPHYLOCOCCUS AUREUS SUSCEPTIBILITIES PERFORMED ON PREVIOUS CULTURE WITHIN THE LAST 5 DAYS. Performed at [facility], [address], [place], [place] [address]    Report Status 08/22/2024 FINAL  Final  Culture, blood (Routine X 2) w Reflex to ID Panel     Status: Abnormal   Collection Time: 08/21/24 10:46 AM   Specimen: BLOOD  Result Value Ref Range Status   Specimen Description BLOOD BLOOD RIGHT ARM  Final   Special Requests   Final    BOTTLES DRAWN AEROBIC AND ANAEROBIC Blood Culture adequate volume   Culture  Setup Time   Final    GRAM POSITIVE COCCI ANAEROBIC BOTTLE ONLY CRITICAL VALUE NOTED.  VALUE IS CONSISTENT WITH PREVIOUSLY REPORTED AND CALLED VALUE.    Culture (A)  Final    STAPHYLOCOCCUS AUREUS SUSCEPTIBILITIES PERFORMED ON PREVIOUS CULTURE WITHIN THE LAST 5 DAYS. Performed at [facility], [address], [place], [place] [address]    Report Status 08/26/2024 FINAL  Final  Culture, blood (Routine X 2) w Reflex to ID Panel     Status: None   Collection Time: 08/21/24 10:46 AM   Specimen: BLOOD  Result Value Ref Range Status   Specimen Description BLOOD BLOOD LEFT HAND  Final   Special Requests   Final    BOTTLES DRAWN AEROBIC AND ANAEROBIC Blood Culture results may  not be optimal due to an inadequate volume of blood received in culture bottles   Culture   Final    NO GROWTH 5 DAYS Performed at [facility], [address], [place], [place] [address]    Report Status 08/26/2024 FINAL  Final  Culture, blood (Routine X 2) w Reflex to ID Panel     Status: Abnormal (Preliminary result)   Collection Time: 08/25/24 10:03 AM   Specimen:  BLOOD  Result Value Ref Range Status   Specimen Description BLOOD BLOOD RIGHT HAND  Final   Special Requests   Final    AEROBIC BOTTLE ONLY Blood Culture results may not be optimal due to an inadequate volume of blood received in culture bottles   Culture  Setup Time   Final    GRAM POSITIVE COCCI AEROBIC BOTTLE ONLY CRITICAL VALUE NOTED.  VALUE IS CONSISTENT WITH PREVIOUSLY REPORTED AND CALLED VALUE.    Culture (A)  Final    STAPHYLOCOCCUS AUREUS CULTURE REINCUBATED FOR BETTER GROWTH Performed at [facility], [address], [place], [place] [address]    Report Status PENDING  Incomplete  Culture, blood (Routine X 2) w Reflex to ID Panel     Status: None (Preliminary result)   Collection Time: 08/25/24 10:03 AM   Specimen: BLOOD  Result Value Ref Range Status   Specimen Description BLOOD BLOOD LEFT HAND  Final   Special Requests AEROBIC BOTTLE ONLY Blood Culture adequate volume  Final   Culture   Final    NO GROWTH 2 DAYS Performed at [facility], [address] [place], [place] [address]    Report Status PENDING  Incomplete     Radiology Studies: DG Chest 2 View Result Date: 08/27/2024 CLINICAL DATA:  Congestive heart failure EXAM: CHEST - 2 VIEW COMPARISON:  August 17, 2024 FINDINGS: Stable cardiomegaly. Left-sided defibrillator is unchanged. Status post right shoulder arthroplasty. Mild central pulmonary vascular congestion is noted. Possible minimal bibasilar pulmonary edema is noted with small pleural effusions. IMPRESSION: Mild central pulmonary vascular congestion with possible  minimal bibasilar pulmonary edema and small pleural effusions. Electronically Signed   By: Lynwood Landy Raddle M.D.   On: 08/27/2024 11:52      Nilda Fendt, MD, PhD [facility]  Between 7 am - 7 pm I am available, please contact me via Amion (for emergencies) or Securechat (non urgent messages)  Between 7 pm - 7 am I am not available, please contact night coverage MD/APP via Amion  "

## 2024-08-27 NOTE — Progress Notes (Signed)
 "   Progress Note  Patient Name: Tanya Hogan Date of Encounter: 08/27/2024  Primary Cardiologist:   Redell Shallow, MD   Subjective   Denies any CP or SOB  Inpatient Medications    Scheduled Meds:  amiodarone   200 mg Oral Daily   arformoterol   15 mcg Nebulization BID   aspirin   81 mg Oral Daily   budesonide  (PULMICORT ) nebulizer solution  0.25 mg Nebulization BID   Chlorhexidine  Gluconate Cloth  6 each Topical Daily   insulin  aspart  0-5 Units Subcutaneous QHS   insulin  aspart  0-9 Units Subcutaneous TID WC   lactobacillus  1 g Oral TID WC   levothyroxine   112 mcg Oral Daily   multivitamin with minerals  1 tablet Oral Daily   nystatin   5 mL Oral QID   mouth rinse  15 mL Mouth Rinse 4 times per day   pantoprazole   40 mg Oral QAC breakfast   pravastatin   80 mg Oral QHS   quiNIDine sulfate   200 mg Oral TID   revefenacin   175 mcg Nebulization Daily   sodium chloride  flush  3 mL Intravenous Q12H   sodium chloride  flush  3 mL Intravenous Q12H   spironolactone   12.5 mg Oral Daily   torsemide   20 mg Oral Daily   Continuous Infusions:  DAPTOmycin  500 mg (08/26/24 1240)   heparin  1,300 Units/hr (08/26/24 2149)   PRN Meds: acetaminophen  **OR** acetaminophen , HYDROcodone -acetaminophen , levalbuterol , ondansetron  **OR** ondansetron  (ZOFRAN ) IV, mouth rinse, senna-docusate, traZODone    Vital Signs    Vitals:   08/27/24 0236 08/27/24 0418 08/27/24 0829 08/27/24 0858  BP: 95/68  117/67   Pulse: 88  89   Resp: 20  20   Temp: 98 F (36.7 C)     TempSrc: Oral     SpO2: 99%  97% 95%  Weight:  81.2 kg    Height:        Intake/Output Summary (Last 24 hours) at 08/27/2024 0900 Last data filed at 08/27/2024 9373 Gross per 24 hour  Intake 749.2 ml  Output 100 ml  Net 649.2 ml    Filed Weights   08/22/24 0411 08/25/24 0500 08/27/24 0418  Weight: 80 kg 81.4 kg 81.2 kg    Telemetry    AV paced - Personally Reviewed  ECG    NA - Personally Reviewed  Physical Exam    GEN: Well nourished, well developed in no acute distress HEENT: Normal NECK: No JVD; No carotid bruits LYMPHATICS: No lymphadenopathy CARDIAC:RRR, no murmurs, rubs, gallops RESPIRATORY:  Clear to auscultation without rales, wheezing or rhonchi  ABDOMEN: Soft, non-tender, non-distended MUSCULOSKELETAL:  No edema; No deformity  SKIN: Warm and dry NEUROLOGIC:  Alert and oriented x 3 PSYCHIATRIC:  Normal affect  Labs    Chemistry Recent Labs  Lab 08/22/24 0516 08/23/24 0852 08/24/24 0522 08/26/24 1120 08/27/24 0737  NA 133*   < > 133* 129* 130*  K 4.3   < > 4.8 5.1 4.5  CL 100   < > 99 97* 97*  CO2 25   < > 26 24 22   GLUCOSE 112*   < > 117* 128* 174*  BUN 9   < > 12 13 18   CREATININE 0.65   < > 0.71 0.73 0.92  CALCIUM  9.5   < > 9.9 9.6 9.1  PROT 5.9*  --  6.0* 6.2*  --   ALBUMIN 2.7*  --  2.9* 2.9*  --   AST 112*  --  83* 88*  --  ALT 73*  --  55* 48*  --   ALKPHOS 234*  --  205* 209*  --   BILITOT 0.6  --  0.7 1.1  --   GFRNONAA >60   < > >60 >60 >60  ANIONGAP 8   < > 9 9 11    < > = values in this interval not displayed.     Hematology Recent Labs  Lab 08/24/24 0522 08/26/24 1120 08/27/24 0737  WBC 11.9* 17.5* 12.8*  RBC 4.19 4.05 3.78*  HGB 12.8 12.5 11.7*  HCT 38.8 37.2 35.7*  MCV 92.6 91.9 94.4  MCH 30.5 30.9 31.0  MCHC 33.0 33.6 32.8  RDW 14.9 15.2 15.7*  PLT 228 270 233    Cardiac EnzymesNo results for input(s): TROPONINI in the last 168 hours. No results for input(s): TROPIPOC in the last 168 hours.   BNP No results for input(s): BNP, PROBNP in the last 168 hours.    DDimer No results for input(s): DDIMER in the last 168 hours.   Radiology    VAS US  UPPER EXTREMITY VENOUS DUPLEX Result Date: 08/26/2024 UPPER VENOUS STUDY  Patient Name:  Tanya Hogan  Date of Exam:   08/25/2024 Medical Rec #: [id number]        Accession #:    [id number] Date of Birth: August 09, 1951         Patient Gender: F Patient Age:   74 years Exam Location:  [facility] Procedure:      VAS US  UPPER EXTREMITY VENOUS DUPLEX Referring Phys: COSTIN GHERGHE --------------------------------------------------------------------------------  Indications: Swelling, and rule out DVT Comparison Study: No prior exam. Performing Technologist: Edilia Elden Appl  Examination Guidelines: A complete evaluation includes B-mode imaging, spectral Doppler, color Doppler, and power Doppler as needed of all accessible portions of each vessel. Bilateral testing is considered an integral part of a complete examination. Limited examinations for reoccurring indications may be performed as noted.  Right Findings: +----------+------------+---------+-----------+----------+-------+ RIGHT     CompressiblePhasicitySpontaneousPropertiesSummary +----------+------------+---------+-----------+----------+-------+ IJV           Full       Yes       Yes                      +----------+------------+---------+-----------+----------+-------+ Subclavian    Full       Yes       Yes                      +----------+------------+---------+-----------+----------+-------+ Axillary      Full       Yes       Yes                      +----------+------------+---------+-----------+----------+-------+ Brachial      Full       Yes       Yes                      +----------+------------+---------+-----------+----------+-------+ Radial        Full                                          +----------+------------+---------+-----------+----------+-------+ Ulnar         Full                                          +----------+------------+---------+-----------+----------+-------+  Cephalic      Full       Yes       Yes                      +----------+------------+---------+-----------+----------+-------+ Basilic       Full       Yes       Yes                      +----------+------------+---------+-----------+----------+-------+ Superficial vein thrombosis noted  in the Antecubital vein by IV line.  Left Findings: +----------+------------+---------+-----------+----------+-------+ LEFT      CompressiblePhasicitySpontaneousPropertiesSummary +----------+------------+---------+-----------+----------+-------+ IJV         Partial      Yes       Yes                      +----------+------------+---------+-----------+----------+-------+ Subclavian    Full       Yes       Yes                      +----------+------------+---------+-----------+----------+-------+ Axillary      Full       Yes       Yes                      +----------+------------+---------+-----------+----------+-------+ Brachial      Full       Yes       Yes                      +----------+------------+---------+-----------+----------+-------+ Radial        Full                                          +----------+------------+---------+-----------+----------+-------+ Ulnar         Full                                          +----------+------------+---------+-----------+----------+-------+ Cephalic    Partial      No        No                       +----------+------------+---------+-----------+----------+-------+ Basilic     Partial      Yes       Yes                      +----------+------------+---------+-----------+----------+-------+ Deep vein thrombosis noted in the left jugular vein. Superficial vein thrombosis noted in the cephalic vein from the antecubital fossa to the the middle forearm and in a short segment of the basilic vein in the distal upper arm.  Summary:  Right: Findings consistent with acute superficial vein thrombosis involving the antecubital vein.  Left: Findings consistent with acute to age indeterminate deep vein thrombosis involving the left internal jugular vein. Findings consistent with acute superficial vein thrombosis involving the left basilic vein and left cephalic vein.  *See table(s) above for measurements and  observations.  Diagnosing physician: Penne Colorado MD Electronically signed by Penne Colorado MD on 08/26/2024 at 10:41:22 AM.    Final    CT HEAD WO CONTRAST ( ) Result Date: 08/25/2024 EXAM:  CT HEAD WITHOUT CONTRAST 08/25/2024 12:45:47 PM TECHNIQUE: CT of the head was performed without the administration of intravenous contrast. Automated exposure control, iterative reconstruction, and/or weight based adjustment of the mA/kV was utilized to reduce the radiation dose to as low as reasonably achievable. COMPARISON: CT Head dated 08/24/2024. CLINICAL HISTORY: Stroke, follow up. FINDINGS: BRAIN AND VENTRICLES: No acute hemorrhage. Redemonstration of age indeterminate infarct in the right thalamus. There is a small remote lacunar infarct involving the left corona radiata. Mild atherosclerosis within the carotid siphons. Mild chronic microvascular ischemic changes. No hydrocephalus. No extra-axial collection. No mass effect or midline shift. ORBITS: Bilateral lens replacement. SINUSES: Paranasal sinus mucosal thickening. SOFT TISSUES AND SKULL: No acute soft tissue abnormality. No skull fracture. IMPRESSION: 1. Redemonstration of age indeterminate infarct in the right thalamus. 2. Small remote lacunar infarct involving the left corona radiata. 3. Mild chronic microvascular ischemic changes. Electronically signed by: Donnice Mania MD 08/25/2024 01:21 PM EST RP Workstation: [id number]    Cardiac Studies    ECHO:  08/17/24  1. Left ventricular ejection fraction, by estimation, is <20%. The left  ventricle has severely decreased function. The left ventricle demonstrates  global hypokinesis. The left ventricular internal cavity size was severely  dilated. Left ventricular  diastolic parameters are consistent with Grade III diastolic dysfunction  (restrictive).   2. Right ventricular systolic function is moderately reduced. The right  ventricular size is moderately enlarged. There is moderately elevated   pulmonary artery systolic pressure. The estimated right ventricular  systolic pressure is 48.4 mmHg.   3. Left atrial size was severely dilated.   4. Right atrial size was severely dilated.   5. A small pericardial effusion is present. The pericardial effusion is  anterior to the right ventricle. There is no evidence of cardiac  tamponade.   6. The mitral valve is normal in structure. Trivial mitral valve  regurgitation. No evidence of mitral stenosis.   7. The aortic valve is tricuspid. There is mild calcification of the  aortic valve. Aortic valve regurgitation is not visualized. Aortic valve  sclerosis/calcification is present, without any evidence of aortic  stenosis.   8. The inferior vena cava is dilated in size with <50% respiratory  variability, suggesting right atrial pressure of 15 mmHg.    Patient Profile     74 y.o. female with a NICM/CHB, HFrEF likely in part 2/2 pacing-induced CM s/p LEFT MDT CRTP (RA/RV/CS-AIV lead) with subsequent addition of RV-LBaP lead (11/03/21) with subsequent addition of RV-d lead (05/31/24) for ATP for refractory VT, DM2, NASH cirrhosis who presented with lethargy and respiratory distress and is now found to have 4/4 cultures with MRSA.   Assessment & Plan    Bacteremia:  MRSA.  Plan is suppressive antibiotic therapy although clinically she seems to be failing this with diffuse joint pain, rash likely vasculitis, acute neurologic event possibly related.  12/29 cultures are 1 out of 4 positive. Repeat BC were drawn.     -appreciate EP input.  Patient not a candidate at this time for lead extraction given comorbidities -continue antibx per ID  Non ischemic cardiomyopathy Acute on Chronic systolic CHF:  -2D echo EF <20% GHK, G3DD, moderate RV dysfunction, PHTN PASP 48, small PE -s/p CRTP-P and felt not a candidate for device extraction in setting of bacteremia -BP stable at 117/2mmHg -GDMT limited by hypotension and allergies to ARBs and  Entresto -restarted Demadex  20mg  daily yesterday due to crackles in lungs - I's and O's incomplete  - SCr  0.92 K+ 4.5 today; NA improved from 129->>130 this morning - still has crackles in lungs - check BNP - check Cxray today>>if edema then transition demadex  to Lasix  IV - Continue spironolactone  12.5 mg daily    VT:  No sustained arrhythmias.   - Normal sinus rhythm with PVCs on telemetry - Continue amiodarone  200 mg daily  - Continue Quinidine 200 mg 3 times daily   Petechiae:   I would be inclined to think that it is related to the infection itself and possibly a concurrent vasculitis.   Platelets are OK.    For questions or updates, please contact [facility] Please consult [email or web address] for contact info under Cardiology/STEMI.   Signed, Wilbert Bihari, MD  08/27/2024, 9:00 AM    "

## 2024-08-27 NOTE — Progress Notes (Signed)
 Mobility Specialist Progress Note;   08/27/24 1126  Mobility  Activity  (bed level exercises)  Level of Assistance Contact guard assist, steadying assist  Assistive Device None  Distance Ambulated (ft)  (pt deferred)  Range of Motion/Exercises Active Assistive;Right leg;Left leg  Activity Response Tolerated fair  Mobility Referral Yes  Mobility visit 1 Mobility  Mobility Specialist Start Time (ACUTE ONLY) 1126  Mobility Specialist Stop Time (ACUTE ONLY) 1135  Mobility Specialist Time Calculation (min) (ACUTE ONLY) 9 min   Pt deferring ambulation or transferring to chair d/t pain and stiffness this AM. Although encouraged OOB mobility pt still deferred although agreeable to bed level exercises to relieve stiffness. Actively assisted pt in exercises. VSS throughout. Pt left with all needs met, encouraged OOB mobility as tolerated.   Lauraine Erm Mobility Specialist Please contact via SecureChat or [employer] [phone]

## 2024-08-28 ENCOUNTER — Other Ambulatory Visit (HOSPITAL_COMMUNITY): Payer: Self-pay

## 2024-08-28 ENCOUNTER — Telehealth (HOSPITAL_COMMUNITY): Payer: Self-pay

## 2024-08-28 DIAGNOSIS — R21 Rash and other nonspecific skin eruption: Secondary | ICD-10-CM

## 2024-08-28 DIAGNOSIS — R7881 Bacteremia: Secondary | ICD-10-CM | POA: Diagnosis not present

## 2024-08-28 DIAGNOSIS — B9562 Methicillin resistant Staphylococcus aureus infection as the cause of diseases classified elsewhere: Secondary | ICD-10-CM | POA: Diagnosis not present

## 2024-08-28 DIAGNOSIS — I5020 Unspecified systolic (congestive) heart failure: Secondary | ICD-10-CM

## 2024-08-28 DIAGNOSIS — I110 Hypertensive heart disease with heart failure: Secondary | ICD-10-CM | POA: Diagnosis not present

## 2024-08-28 DIAGNOSIS — K7581 Nonalcoholic steatohepatitis (NASH): Secondary | ICD-10-CM | POA: Diagnosis not present

## 2024-08-28 DIAGNOSIS — K7460 Unspecified cirrhosis of liver: Secondary | ICD-10-CM | POA: Diagnosis not present

## 2024-08-28 LAB — CBC
HCT: 33.1 % — ABNORMAL LOW (ref 36.0–46.0)
Hemoglobin: 11 g/dL — ABNORMAL LOW (ref 12.0–15.0)
MCH: 30.9 pg (ref 26.0–34.0)
MCHC: 33.2 g/dL (ref 30.0–36.0)
MCV: 93 fL (ref 80.0–100.0)
Platelets: 260 K/uL (ref 150–400)
RBC: 3.56 MIL/uL — ABNORMAL LOW (ref 3.87–5.11)
RDW: 15.5 % (ref 11.5–15.5)
WBC: 10.3 K/uL (ref 4.0–10.5)
nRBC: 0 % (ref 0.0–0.2)

## 2024-08-28 LAB — COMPREHENSIVE METABOLIC PANEL WITH GFR
ALT: 36 U/L (ref 0–44)
AST: 64 U/L — ABNORMAL HIGH (ref 15–41)
Albumin: 2.6 g/dL — ABNORMAL LOW (ref 3.5–5.0)
Alkaline Phosphatase: 166 U/L — ABNORMAL HIGH (ref 38–126)
Anion gap: 7 (ref 5–15)
BUN: 16 mg/dL (ref 8–23)
CO2: 26 mmol/L (ref 22–32)
Calcium: 8.7 mg/dL — ABNORMAL LOW (ref 8.9–10.3)
Chloride: 99 mmol/L (ref 98–111)
Creatinine, Ser: 0.82 mg/dL (ref 0.44–1.00)
GFR, Estimated: >60 mL/min
Glucose, Bld: 95 mg/dL (ref 70–99)
Potassium: 4.3 mmol/L (ref 3.5–5.1)
Sodium: 132 mmol/L — ABNORMAL LOW (ref 135–145)
Total Bilirubin: 0.8 mg/dL (ref 0.0–1.2)
Total Protein: 5.5 g/dL — ABNORMAL LOW (ref 6.5–8.1)

## 2024-08-28 LAB — GLUCOSE, CAPILLARY
Glucose-Capillary: 108 mg/dL — ABNORMAL HIGH (ref 70–99)
Glucose-Capillary: 119 mg/dL — ABNORMAL HIGH (ref 70–99)
Glucose-Capillary: 121 mg/dL — ABNORMAL HIGH (ref 70–99)
Glucose-Capillary: 97 mg/dL (ref 70–99)

## 2024-08-28 LAB — MAGNESIUM: Magnesium: 2 mg/dL (ref 1.7–2.4)

## 2024-08-28 MED ORDER — TORSEMIDE 20 MG PO TABS
40.0000 mg | ORAL_TABLET | Freq: Every day | ORAL | Status: DC
  Administered 2024-08-28 – 2024-08-30 (×3): 40 mg via ORAL
  Filled 2024-08-28 (×3): qty 2

## 2024-08-28 MED ORDER — SODIUM CHLORIDE 0.9 % IV SOLN
600.0000 mg | Freq: Three times a day (TID) | INTRAVENOUS | Status: DC
  Administered 2024-08-28 – 2024-08-29 (×4): 600 mg via INTRAVENOUS
  Filled 2024-08-28 (×5): qty 20

## 2024-08-28 MED ORDER — GUAIFENESIN-DM 100-10 MG/5ML PO SYRP
5.0000 mL | ORAL_SOLUTION | ORAL | Status: DC | PRN
  Administered 2024-08-28 – 2024-08-29 (×3): 5 mL via ORAL
  Filled 2024-08-28 (×4): qty 5

## 2024-08-28 MED ORDER — MENTHOL 3 MG MT LOZG
1.0000 | LOZENGE | OROMUCOSAL | Status: DC | PRN
  Administered 2024-08-28 – 2024-08-30 (×3): 3 mg via ORAL
  Filled 2024-08-28: qty 9

## 2024-08-28 NOTE — Progress Notes (Signed)
"  Rounding Note   Patient Name: Tanya Hogan Date of Encounter: 08/28/2024  [facility] Cardiologist: Redell Shallow, MD   Subjective Denies dyspnea or CP  Scheduled Meds:  amiodarone   200 mg Oral Daily   apixaban   10 mg Oral BID   Followed by   NOREEN ON 09/17/2024] apixaban   5 mg Oral BID   arformoterol   15 mcg Nebulization BID   aspirin   81 mg Oral Daily   budesonide  (PULMICORT ) nebulizer solution  0.25 mg Nebulization BID   Chlorhexidine  Gluconate Cloth  6 each Topical Daily   insulin  aspart  0-5 Units Subcutaneous QHS   insulin  aspart  0-9 Units Subcutaneous TID WC   lactobacillus  1 g Oral TID WC   levothyroxine   112 mcg Oral Daily   multivitamin with minerals  1 tablet Oral Daily   nystatin   5 mL Oral QID   mouth rinse  15 mL Mouth Rinse 4 times per day   pantoprazole   40 mg Oral QAC breakfast   pravastatin   80 mg Oral QHS   quiNIDine sulfate   200 mg Oral TID   revefenacin   175 mcg Nebulization Daily   sodium chloride  flush  3 mL Intravenous Q12H   sodium chloride  flush  3 mL Intravenous Q12H   spironolactone   12.5 mg Oral Daily   torsemide   20 mg Oral Daily   Continuous Infusions:  DAPTOmycin  Stopped (08/27/24 1335)   PRN Meds: acetaminophen  **OR** acetaminophen , HYDROcodone -acetaminophen , levalbuterol , ondansetron  **OR** ondansetron  (ZOFRAN ) IV, mouth rinse, senna-docusate, traZODone    Vital Signs  Vitals:   08/28/24 0212 08/28/24 0246 08/28/24 0500 08/28/24 0745  BP:  112/62  (!) 109/59  Pulse: 89 89  89  Resp: (!) 25 (!) 21  18  Temp:  97.8 F (36.6 C)  97.8 F (36.6 C)  TempSrc:  Axillary  Oral  SpO2: 96% 97%  100%  Weight:   80.8 kg   Height:        Intake/Output Summary (Last 24 hours) at 08/28/2024 0755 Last data filed at 08/28/2024 0600 Gross per 24 hour  Intake 684.88 ml  Output 1200 ml  Net -515.12 ml      08/28/2024    5:00 AM 08/27/2024    4:18 AM 08/25/2024    5:00 AM  Last 3 Weights  Weight (lbs) 178 lb 1.6 oz 179 lb 179 lb  7.3 oz  Weight (kg) 80.786 kg 81.194 kg 81.4 kg      Telemetry AV paced - Personally Reviewed   Physical Exam  GEN: NAD Neck: no adenopathy Cardiac: RRR, no rub Respiratory: Diminished breath sounds bases GI: Soft, NT/ND MS: Trace lower extremity edema; rash bilaterally; 1+ edema left upper extremity Neuro:  Grossly intact Psych: Normal affect   Labs High Sensitivity Troponin:   Recent Labs  Lab 08/02/24 1942 08/02/24 2142  TROPONINIHS 19* 21*    Recent Labs  Lab 08/16/24 0707  TRNPT 33*       Chemistry Recent Labs  Lab 08/24/24 0522 08/26/24 1120 08/27/24 0737 08/28/24 0402  NA 133* 129* 130* 132*  K 4.8 5.1 4.5 4.3  CL 99 97* 97* 99  CO2 26 24 22 26   GLUCOSE 117* 128* 174* 95  BUN 12 13 18 16   CREATININE 0.71 0.73 0.92 0.82  CALCIUM  9.9 9.6 9.1 8.7*  MG 1.9 2.1  --  2.0  PROT 6.0* 6.2*  --  5.5*  ALBUMIN 2.9* 2.9*  --  2.6*  AST 83* 88*  --  64*  ALT 55* 48*  --  36  ALKPHOS 205* 209*  --  166*  BILITOT 0.7 1.1  --  0.8  GFRNONAA >60 >60 >60 >60  ANIONGAP 9 9 11 7      Hematology Recent Labs  Lab 08/26/24 1120 08/27/24 0737 08/28/24 0402  WBC 17.5* 12.8* 10.3  RBC 4.05 3.78* 3.56*  HGB 12.5 11.7* 11.0*  HCT 37.2 35.7* 33.1*  MCV 91.9 94.4 93.0  MCH 30.9 31.0 30.9  MCHC 33.6 32.8 33.2  RDW 15.2 15.7* 15.5  PLT 270 233 260    BNP Recent Labs  Lab 08/27/24 0737  PROBNP 5,797.0*      Radiology  DG Chest 2 View Result Date: 08/27/2024 CLINICAL DATA:  Congestive heart failure EXAM: CHEST - 2 VIEW COMPARISON:  August 17, 2024 FINDINGS: Stable cardiomegaly. Left-sided defibrillator is unchanged. Status post right shoulder arthroplasty. Mild central pulmonary vascular congestion is noted. Possible minimal bibasilar pulmonary edema is noted with small pleural effusions. IMPRESSION: Mild central pulmonary vascular congestion with possible minimal bibasilar pulmonary edema and small pleural effusions. Electronically Signed   By: Lynwood Landy Raddle M.D.   On: 08/27/2024 11:52    Patient Profile   74 y.o. female with a NICM/CHB, HFrEF likely in part 2/2 pacing-induced CM s/p LEFT MDT CRTP (RA/RV/CS-AIV lead) with subsequent addition of RV-LBaP lead (11/03/21) with subsequent addition of RV-d lead (05/31/24) for ATP for refractory VT, DM2, NASH cirrhosis who presented with lethargy and respiratory distress and is now found to have 4/4 cultures with MRSA.  Echocardiogram this admission shows ejection fraction less than 20%, severe left ventricular enlargement, grade 3 diastolic dysfunction, moderate right ventricular enlargement, moderate RV dysfunction, severe biatrial enlargement, small pericardial effusion.  Assessment & Plan   1 bacteremia-MRSA bacteremia noted.  Patient is felt not to be a good candidate for transesophageal echocardiogram or device extraction.  Continue daptomycin .  Infectious disease following.  Plan ultimately his home antibiotics.  2 nonischemic cardiomyopathy/acute on chronic systolic congestive heart failure-patient appears to be mildly volume overloaded on examination.  Will give Demadex  40 mg today and adjust based on follow-up exam.  Will continue spironolactone  at present dose. She apparently has had an allergy to ARBs and Entresto.  Will consider low-dose carvedilol  at discharge.  3 history of ventricular tachycardia-continue amiodarone  and quinidine.  No significant arrhythmias over the last 24 hours on telemetry.  4 status post CRT-P-as above felt not to be a candidate for device extraction.  5 no CODE BLUE   6 upper extremity DVT-apixaban  has been initiated.  For questions or updates, please contact [facility] Please consult [email or web address] for contact info under  Signed, Redell Shallow, MD  08/28/2024, 7:55 AM    "

## 2024-08-28 NOTE — Progress Notes (Signed)
 "        [facility]  Date of Admission:  08/16/2024     Assessment: 74 year old female with past medical history of NICM with heart failure EF 20 to 25% status post defibrillator, last replaced on 06/10/2024, NASH cirrhosis, diabetes type 2, hypertension, OSA on BiPAP, IBS presenting with bilateral lower extremity edema with some shortness of breath admitted with heart failure found to have   #MRSA bacteremia in the setting of AICD with recent upgrade on 06/10/2024 - On 06/10/24 patient recent upgraded of CRT-P to CRT-D,  additional RV lead palced - Chest x-ray on admission showed no acute abnormality, WBC 16K.  Patient afebrile. - not candidate for pacer removal -- no sign/sx of metastatic foci of infection -- 08/28/24 she has persistently been bacteremic (12/25, 12/27, 12/29, 1/02). Suspect due to pacemaker being nidus. No other site metastatic foci still. Guarded prognosis. Need to avoid rifampin; no role for gent. 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 -- involves whole body No suspicion for abx related rash (developed within 1 day switching to daptomycin ) Painful - not itchy To be complete will send hepatitis serology Tx of lcv by infection is tx of infection; no benefit for steroid     Micro: 12/25, 12/27 bcx mrsa 12/29 bcx in progress   Patient poor candidate for pacer leads extraction in setting cardiomyopathy So far blood cx not sterilized yet ?pacer endocarditis or other focus of deep abscess -- needs w/u  At this time tee can be deferred given nonsurgical candidate, unless persistent bacteremia    #Diabetes mellitus #NASH cirrhosis #Heart failure with reduced ejection fraction  Recommendations: - switch abx today to dapto ceftaroline  -- repeat bcx -- abd pelv ct with contrast -- hepatitis panel -- maintain contact isolation  precaution -- discussed with primary team    Subjective: 08/24/24 lethargic code stroke called -- pacer mri incompatible? Head ct and cta negative Rash whole body -- painful No other complaint Persistent bacteremia  Ros: All other ros negative  Microbiology:   Antibiotics:  vanc 12/26-c    Cultures: Blood 12/25, 12/27, 12/29, 1/02 1/05 in progress  Urine   Other  Principal Problem:   MRSA bacteremia Active Problems:   History of left breast cancer   Hypothyroidism   Type 2 diabetes mellitus with hyperlipidemia (HCC)   Essential hypertension   NASH (nonalcoholic steatohepatitis)   Microcytic anemia   Irritable bowel syndrome with constipation   Ventricular tachycardia, unspecified (HCC)   Gastroesophageal reflux disease   NICM (nonischemic cardiomyopathy) (HCC)   COPD (chronic obstructive pulmonary disease) (HCC)   Obesity, Class I, BMI 30-34.9   Acute on chronic combined systolic and diastolic CHF (congestive heart failure) (HCC)   Hyponatremia   Allergies[1]  Scheduled Meds:  amiodarone   200 mg Oral Daily   apixaban   10 mg Oral BID   Followed by   NOREEN ON 08/29/2024] apixaban   5 mg Oral BID   arformoterol   15 mcg Nebulization BID   aspirin   81 mg Oral Daily   budesonide  (PULMICORT ) nebulizer solution  0.25 mg Nebulization BID   Chlorhexidine  Gluconate Cloth  6 each Topical Daily   insulin  aspart  0-5 Units Subcutaneous QHS   insulin  aspart  0-9 Units Subcutaneous TID WC   lactobacillus  1 g Oral TID WC   levothyroxine   112 mcg Oral Daily   multivitamin with minerals  1 tablet Oral Daily   nystatin   5 mL Oral QID   mouth rinse  15 mL Mouth Rinse 4 times per day   pantoprazole   40 mg Oral QAC breakfast   pravastatin   80 mg Oral QHS   quiNIDine sulfate   200 mg Oral TID   revefenacin   175 mcg Nebulization Daily   sodium chloride  flush  3 mL Intravenous Q12H   sodium chloride  flush  3 mL Intravenous Q12H   spironolactone   12.5 mg Oral Daily    torsemide   40 mg Oral Daily   Continuous Infusions:  ceFTAROline  (TEFLARO ) IV 600 mg (08/28/24 1127)   DAPTOmycin  500 mg (08/28/24 1319)   PRN Meds:.acetaminophen  **OR** acetaminophen , guaiFENesin -dextromethorphan , HYDROcodone -acetaminophen , levalbuterol , menthol , ondansetron  **OR** ondansetron  (ZOFRAN ) IV, mouth rinse, senna-docusate, traZODone     OBJECTIVE: Vitals:   08/28/24 0858 08/28/24 0901 08/28/24 1257 08/28/24 1540  BP:   105/71 102/62  Pulse: 90  90 87  Resp: 19  (!) 25 20  Temp:   98.1 F (36.7 C) (!) 97.4 F (36.3 C)  TempSrc:   Oral Oral  SpO2: 98% 100% 97% 94%  Weight:      Height:       Body mass index is 33.65 kg/m.  Physical Exam General/constitutional: no distress, pleasant HEENT: Normocephalic, PER, Conj Clear, EOMI, Oropharynx clear Neck supple CV: rrr no mrg -- pacer icd site no tenderness Lungs: clear to auscultation, normal respiratory effort Abd: Soft, Nontender Ext: no edema Skin: diffuse macolopapular and palpable petechiael rash body, ext especially lower ext Neuro: nonfocal MSK: no peripheral joint swelling/tenderness/warmth; back spines nontender   Lab Results Lab Results  Component Value Date   WBC 10.3 08/28/2024   HGB 11.0 (L) 08/28/2024   HCT 33.1 (L) 08/28/2024   MCV 93.0 08/28/2024   PLT 260 08/28/2024    Lab Results  Component Value Date   CREATININE 0.82 08/28/2024   BUN 16 08/28/2024   NA 132 (L) 08/28/2024   K 4.3 08/28/2024   CL 99 08/28/2024   CO2 26 08/28/2024    Lab Results  Component Value Date   ALT 36 08/28/2024   AST 64 (H) 08/28/2024   ALKPHOS 166 (H) 08/28/2024   BILITOT 0.8 08/28/2024      Microbiology: Recent Results (from the past 240 hours)  Culture, blood (Routine X 2) w Reflex to ID Panel     Status: Abnormal   Collection Time: 08/19/24 11:15 AM   Specimen: BLOOD  Result Value Ref Range Status   Specimen Description BLOOD BLOOD RIGHT ARM  Final   Special Requests   Final    BOTTLES DRAWN  AEROBIC AND ANAEROBIC Blood Culture results may not be optimal due to an inadequate volume of blood received in culture bottles   Culture  Setup Time   Final    GRAM POSITIVE COCCI IN CLUSTERS IN BOTH AEROBIC AND ANAEROBIC BOTTLES Gram Stain Report Called to,Read Back By and Verified With: PHARMD G ABBOTT 08/20/2024 @ 0552 BY AB CRITICAL VALUE NOTED.  VALUE IS CONSISTENT WITH PREVIOUSLY REPORTED AND CALLED VALUE.    Culture (A)  Final    STAPHYLOCOCCUS AUREUS SUSCEPTIBILITIES PERFORMED ON PREVIOUS CULTURE WITHIN THE LAST 5 DAYS. Performed at [facility], [address] [place], [place] [address]    Report Status 08/22/2024 FINAL  Final  Culture, blood (Routine X 2) w Reflex to ID Panel     Status: Abnormal   Collection Time: 08/19/24 11:15 AM   Specimen: BLOOD  LEFT ARM  Result Value Ref Range Status   Specimen Description BLOOD LEFT ARM  Final   Special Requests   Final    BOTTLES DRAWN AEROBIC ONLY Blood Culture results may not be optimal due to an inadequate volume of blood received in culture bottles   Culture  Setup Time   Final    GRAM POSITIVE COCCI IN CLUSTERS AEROBIC BOTTLE ONLY CRITICAL VALUE NOTED.  VALUE IS CONSISTENT WITH PREVIOUSLY REPORTED AND CALLED VALUE.    Culture (A)  Final    STAPHYLOCOCCUS AUREUS SUSCEPTIBILITIES PERFORMED ON PREVIOUS CULTURE WITHIN THE LAST 5 DAYS. Performed at [facility], [address] [place], [place] [address]    Report Status 08/22/2024 FINAL  Final  Culture, blood (Routine X 2) w Reflex to ID Panel     Status: Abnormal   Collection Time: 08/21/24 10:46 AM   Specimen: BLOOD  Result Value Ref Range Status   Specimen Description BLOOD BLOOD RIGHT ARM  Final   Special Requests   Final    BOTTLES DRAWN AEROBIC AND ANAEROBIC Blood Culture adequate volume   Culture  Setup Time   Final    GRAM POSITIVE COCCI ANAEROBIC BOTTLE ONLY CRITICAL VALUE NOTED.  VALUE IS CONSISTENT WITH PREVIOUSLY REPORTED AND CALLED VALUE.     Culture (A)  Final    STAPHYLOCOCCUS AUREUS SUSCEPTIBILITIES PERFORMED ON PREVIOUS CULTURE WITHIN THE LAST 5 DAYS. Performed at [facility], [address], [place], [place] [address]    Report Status 08/26/2024 FINAL  Final  Culture, blood (Routine X 2) w Reflex to ID Panel     Status: None   Collection Time: 08/21/24 10:46 AM   Specimen: BLOOD  Result Value Ref Range Status   Specimen Description BLOOD BLOOD LEFT HAND  Final   Special Requests   Final    BOTTLES DRAWN AEROBIC AND ANAEROBIC Blood Culture results may not be optimal due to an inadequate volume of blood received in culture bottles   Culture   Final    NO GROWTH 5 DAYS Performed at [facility], [address], [place], [place] [address]    Report Status 08/26/2024 FINAL  Final  Culture, blood (Routine X 2) w Reflex to ID Panel     Status: Abnormal (Preliminary result)   Collection Time: 08/25/24 10:03 AM   Specimen: BLOOD  Result Value Ref Range Status   Specimen Description BLOOD BLOOD RIGHT HAND  Final   Special Requests   Final    AEROBIC BOTTLE ONLY Blood Culture results may not be optimal due to an inadequate volume of blood received in culture bottles   Culture  Setup Time   Final    GRAM POSITIVE COCCI AEROBIC BOTTLE ONLY CRITICAL VALUE NOTED.  VALUE IS CONSISTENT WITH PREVIOUSLY REPORTED AND CALLED VALUE.    Culture (A)  Final    STAPHYLOCOCCUS AUREUS SUSCEPTIBILITIES TO FOLLOW Performed at [facility], [address], [place], [place] [address]    Report Status PENDING  Incomplete  Culture, blood (Routine X 2) w Reflex to ID Panel     Status: None (Preliminary result)   Collection Time: 08/25/24 10:03 AM   Specimen: BLOOD  Result Value Ref Range Status   Specimen Description BLOOD BLOOD LEFT HAND  Final   Special Requests AEROBIC BOTTLE ONLY Blood Culture adequate volume  Final   Culture   Final    NO GROWTH 3 DAYS Performed at [facility], [address]  [place], [place] [address]    Report Status PENDING  Incomplete     Serology:   Imaging: If present, new imagings (plain films, ct scans, and mri) have been personally visualized and interpreted; radiology reports have been reviewed. Decision making incorporated into the Impression / Recommendations.  12/25 tte  1. Left ventricular ejection fraction, by estimation, is <20%. The left  ventricle has severely decreased function. The left ventricle demonstrates  global hypokinesis. The left ventricular internal cavity size was severely  dilated. Left ventricular  diastolic parameters are consistent with Grade III diastolic dysfunction  (restrictive).   2. Right ventricular systolic function is moderately reduced. The right  ventricular size is moderately enlarged. There is moderately elevated  pulmonary artery systolic pressure. The estimated right ventricular  systolic pressure is 48.4 mmHg.   3. Left atrial size was severely dilated.   4. Right atrial size was severely dilated.   5. A small pericardial effusion is present. The pericardial effusion is  anterior to the right ventricle. There is no evidence of cardiac  tamponade.   6. The mitral valve is normal in structure. Trivial mitral valve  regurgitation. No evidence of mitral stenosis.   7. The aortic valve is tricuspid. There is mild calcification of the  aortic valve. Aortic valve regurgitation is not visualized. Aortic valve  sclerosis/calcification is present, without any evidence of aortic  stenosis.   8. The inferior vena cava is dilated in size with <50% respiratory  variability, suggesting right atrial pressure of 15 mmHg.    1/1 ct angio head/neck 1. No acute large vessel occlusion. 2. Moderate stenosis of a proximal M2 branch of the left MCA. 3. Mild stenosis of the left cavernous and supraclinoid ICA. 4. Mild atherosclerosis of the right V4 segment resulting in mild stenosis. 5. 0.9 cm nodular focus of enhancing soft  tissue in the right aspect of the vallecula abutting the epiglottis and the glossoepiglottic fold. Recommend nonemergent ENT evaluation with direct visualization.  1/1 ct head code stroke 1. No acute intracranial hemorrhage. 2. ASPECT score 10. 3. Small age indeterminate lacunar infarct in the right thalamus. 4. Findings messaged to Dr. Matthews via the [facility] messaging system at 3:57 PM on 08/24/24.  Constance ONEIDA Passer, MD [facility] [phone] pager    08/28/2024, 5:31 PM      [1]  Allergies Allergen Reactions   Injectafer [Ferric Carboxymaltose] Shortness Of Breath    Mild SOB following injectafer infusion    Penicillins Anaphylaxis and Rash    Tolerated ceftin  2020   Accupril [Quinapril Hcl] Other (See Comments)    Hyperkalemia=high potassium level   Celebrex [Celecoxib] Other (See Comments)    Hx hematemesis, bleeding ulcer   Cozaar  [Losartan  Potassium] Itching   Dml Forte Rash   Entresto [Sacubitril-Valsartan] Other (See Comments)    Hyperkalemia   Flonase  [Fluticasone ] Other (See Comments)    Hx nasal ulcer   Lactose Intolerance (Gi) Diarrhea   Lipitor [Atorvastatin ] Other (See Comments)    Myalgias    Nasonex  [Mometasone  Furoate] Other (See Comments)    Epistaxis    Nsaids Other (See Comments)    Hx hematemesis, bleeding ulcer   Advil [Ibuprofen] Nausea And Vomiting    Hx hematemesis, bleeding ulcer   Baking Soda-Fluoride [Sodium Fluoride] Itching and Rash   Bayer Aspirin  [Aspirin ] Other (See Comments)    Hx hematemesis, bleeding ulcer   Chamomile Hives   Clindamycin /Lincomycin Rash  Codeine Nausea And Vomiting   Lasix  [Furosemide ] Other (See Comments)    Tinnitus  Hypotension   Miralax  [Polyethylene Glycol (Macrogol)] Other (See Comments)    Leaky gut syndrome   Quinine Rash   Reclast  [Zoledronic  Acid] Rash and Other (See Comments)    Tremors    Rifadin [Rifampin] Other (See Comments)    Unknown reaction    Singulair  [Montelukast ] Other (See Comments)    Dizziness    Sulfa Antibiotics Rash   Ultram  [Tramadol ] Anxiety and Rash   Vesicare  [Solifenacin ] Other (See Comments)    Urinary retention   Wellbutrin  [Bupropion ] Other (See Comments)    Myalgias  Skin crawling sensation   Wool Alcohol  [Lanolin] Rash   "

## 2024-08-28 NOTE — Progress Notes (Signed)
 This chaplain responded to PMT PA-Josseline's consult for creating the Pt. Advance Directive: HCPOA and Living Will.  The Pt. is sitting up in the bedside chair requesting to be moved to the bed at the time of the visit. The chaplain updated the Pt. RN.  The chaplain understands the Pt. prefers the presence of a family member while notarizing her AD. The chaplain offered notarizing the Pt. AD on the day of a family meeting.  The chaplain listened reflectively as the Pt. talked about this admission and her medical next steps.  The Pt. presents herself as tired and requesting a family meeting with her daughter and son. The chaplain shared PMT PA-Josseline returns on Tuesday. The chaplain understands support from the family is very important to the Pt. and the possibility of family not attending is upsetting to the Pt.   The Pt. asked about Hospice care and has basic information about Hospice from her friends. The chaplain invited the Pt. to learn more about hospice care and consider quality of life in her decision. The chaplain understands the Pt. is connecting the extraction to hospice care.   The Pt. faith and suffering is also part of the ongoing conversations with the chaplain.  This chaplain is available for F/U spiritual care as needed.  Chaplain Leeroy Hummer [phone]

## 2024-08-28 NOTE — Care Management Important Message (Signed)
 Important Message  Patient Details  Name: Tanya Hogan MRN: [id number] Date of Birth: March 11, 1951   Important Message Given:  Yes - Medicare IM     Vonzell Arrie Sharps 08/28/2024, 10:18 AM

## 2024-08-28 NOTE — Plan of Care (Signed)
 Pt is alert and fully oriented x 4, stable hemodynamically, AV- paced on the monitor, afebrile, on BiPAP at night. Pt has limit mobility from dyspnea with exertion to bedside commode. She is able to sleep well tonight with normal respiratory effort at rest.   Pt had complaints of constipation. Senokot was given. Her last BM was s small one on 08/27/24. Plan of care was reviewed. Continue to monitor.   Problem: Clinical Measurements: Goal: Ability to maintain clinical measurements within normal limits will improve Outcome: Progressing Goal: Will remain free from infection Outcome: Progressing Goal: Diagnostic test results will improve Outcome: Progressing Goal: Respiratory complications will improve Outcome: Progressing Goal: Cardiovascular complication will be avoided Outcome: Progressing   Problem: Activity: Goal: Risk for activity intolerance will decrease Outcome: Progressing   Problem: Elimination: Goal: Will not experience complications related to bowel motility Outcome: Progressing Goal: Will not experience complications related to urinary retention Outcome: Progressing   Problem: Safety: Goal: Ability to remain free from injury will improve Outcome: Progressing   Problem: Skin Integrity: Goal: Risk for impaired skin integrity will decrease Outcome: Progressing   Problem: Metabolic: Goal: Ability to maintain appropriate glucose levels will improve Outcome: Progressing   Wendi Dash, RN

## 2024-08-28 NOTE — Progress Notes (Addendum)
 " PROGRESS NOTE  Tanya Hogan FMW:[id number] DOB: May 16, 1951 DOA: 08/16/2024 PCP: [name], MD   LOS: 12 days   Brief Narrative / Interim history: 74 year old female with history of chronic systolic CHF, HTN, PVD, DM2, presence of pacemaker, hypothyroidism comes into the hospital with shortness of breath.  Of note, she was recently hospitalized mid-December for CHF exacerbation, sent home after diuresis, but developed recurrent dyspnea and lower extremity edema at home and came back to the ED.  She was found to have MRSA bacteremia.  ID consulted and following.  Due to presence of pacemaker cardiology consulted as well.  She is deemed to be a very poor candidate for pacer lead extraction given end-stage heart failure and poor prognosis.  Palliative consulted as well  Subjective / 24h Interval events: She complains of an incessant cough this morning.  Reports a history of intermittent coughing at home relieved by inhalers.  Assesement and Plan: Principal problem MRSA bacteremia -ID following, appreciate input.  Currently she is on daptomycin .  She has a pacemaker in place but not a candidate to remove so likely needs suppressive prolonged antibiotics - A TEE was not pursued due to high risk.  It appears that she is unable to clear her bacteremia, blood cultures were positive for MRSA on 12/25, 12/27, 12/29 and now 1/2.  She is not a candidate to remove the pacemaker.  This is overall confers a very poor prognosis.  Palliative also consulted.  ID added ceftaroline  today  Active problems Acute onset generalized weakness - on 08/24/2024.  A code stroke was called, she was evaluated at the bedside by the neurology team.  A CT of the head showed a small age-indeterminate lacunar infarct in the right thalamus.  Pete CT scan at 24 hours without change.  Neurology does not think this was a stroke stroke.  She is unable to get an MRI due to the pacemaker  Skin rash -in the setting of MRSA  bacteremia, possibly IgA vasculitis.  Overall improving  Acute on chronic systolic CHF -most recent 2D echo showed LVEF less than 20%, global hypokinesis, severe LV dilatation, grade 3 diastolic dysfunction, RV systolic function moderate reduction, RVSP 48, biatrial severe dilatation and small pericardial effusion.  She was diuresed - Volume status slightly up.  Cardiology following, continue diuretics  Left upper extremity, left IJ vein thrombosis-on Eliquis   CHB-with pacemaker.  Recently had subsequent addition of RV-the lead on October 2025 for ATP for refractory VT  History of VT-no arrhythmias.  Continue suppressive treatment with amiodarone  as well as quinidine  Hypothyroidism -continue Synthroid   Essential hypertension-continue spironolactone   NASH cirrhosis-no signs of decompensation  Underlying COPD-no wheezing, stable, continue bronchodilator therapy  History of left breast cancer-outpatient follow-up, in remission  Microcytic anemia-continue to monitor, no bleeding  Hyponatremia-overall stable, no large shifts noted.  Sodium 132  Obesity, class I-BMI 33.3.    Scheduled Meds:  amiodarone   200 mg Oral Daily   apixaban   10 mg Oral BID   Followed by   NOREEN ON 09/05/2024] apixaban   5 mg Oral BID   arformoterol   15 mcg Nebulization BID   aspirin   81 mg Oral Daily   budesonide  (PULMICORT ) nebulizer solution  0.25 mg Nebulization BID   Chlorhexidine  Gluconate Cloth  6 each Topical Daily   insulin  aspart  0-5 Units Subcutaneous QHS   insulin  aspart  0-9 Units Subcutaneous TID WC   lactobacillus  1 g Oral TID WC   levothyroxine   112 mcg  Oral Daily   multivitamin with minerals  1 tablet Oral Daily   nystatin   5 mL Oral QID   mouth rinse  15 mL Mouth Rinse 4 times per day   pantoprazole   40 mg Oral QAC breakfast   pravastatin   80 mg Oral QHS   quiNIDine sulfate   200 mg Oral TID   revefenacin   175 mcg Nebulization Daily   sodium chloride  flush  3 mL Intravenous Q12H    sodium chloride  flush  3 mL Intravenous Q12H   spironolactone   12.5 mg Oral Daily   torsemide   40 mg Oral Daily   Continuous Infusions:  ceFTAROline  (TEFLARO ) IV 600 mg (08/28/24 1127)   DAPTOmycin  500 mg (08/28/24 1319)   PRN Meds:.acetaminophen  **OR** acetaminophen , guaiFENesin -dextromethorphan , HYDROcodone -acetaminophen , levalbuterol , menthol , ondansetron  **OR** ondansetron  (ZOFRAN ) IV, mouth rinse, senna-docusate, traZODone   Current Outpatient Medications  Medication Instructions   albuterol  (PROVENTIL ) 2.5 mg, Nebulization, Daily PRN   albuterol  (VENTOLIN  HFA) 108 (90 Base) MCG/ACT inhaler 2 puffs, Inhalation, Every 6 hours PRN   alendronate  (FOSAMAX ) 70 MG tablet TAKE 1 TABLET BY MOUTH EVERY 7  DAYS WITH FULL GLASS OF WATER  ON AN EMPTY STOMACH   amiodarone  (PACERONE ) 200 mg, Oral, Daily   aspirin  81 mg, Oral, Daily   Calcium  Carbonate-Vit D-Min (CALCIUM  600+D PLUS MINERALS) 600-400 MG-UNIT TABS 1 tab p.o. twice daily   EPINEPHrine  (EPI-PEN) 0.3 mg, Intramuscular, As needed   fexofenadine  (ALLEGRA ) 180 mg, Oral, Daily   fluticasone -salmeterol (WIXELA INHUB) 250-50 MCG/ACT AEPB 1 puff, Inhalation, 2 times daily   levothyroxine  (SYNTHROID ) 112 mcg, Oral, Daily   metFORMIN  (GLUCOPHAGE -XR) 500 mg, Oral, Daily, Per 08/09/24 discharge states 1 tablet twice daily - patient states PCP has her on this once daily and they plan to discuss once or twice daily at 08/14/24 appt   Multiple Vitamins-Minerals (MULTIVITAMIN WOMEN 50+) TABS 1 tablet, Daily   pantoprazole  (PROTONIX ) 40 mg, Oral, Daily before breakfast   pravastatin  (PRAVACHOL ) 40 mg, Oral, Daily at bedtime   quiNIDine sulfate  200 mg, Oral, 3 times daily   SPIRIVA  RESPIMAT 2.5 MCG/ACT AERS 2 puffs, Inhalation, Daily   spironolactone  (ALDACTONE ) 12.5 mg, Oral, Daily   torsemide  (DEMADEX ) 20 mg, Oral, Daily, Take 2 tablets daily in case of weight gain 2 to 3 lbs in 24 hrs or 5 lbs in 7 days   valACYclovir  (VALTREX ) 1,000 mg, Oral, 2  times daily    Diet Orders (From admission, onward)     Start     Ordered   08/20/24 1220  Diet Heart Room service appropriate? Yes with Assist; Fluid consistency: Thin  Diet effective now       Question Answer Comment  Room service appropriate? Yes with Assist   Fluid consistency: Thin      08/20/24 1219            DVT prophylaxis: apixaban  (ELIQUIS ) tablet 5 mg Start: 09/13/2024 1000 apixaban  (ELIQUIS ) tablet 10 mg Start: 08/27/24 1230 SCDs Start: 08/16/24 1020 apixaban  (ELIQUIS ) tablet 10 mg  apixaban  (ELIQUIS ) tablet 5 mg   Lab Results  Component Value Date   PLT 260 08/28/2024      Code Status: Limited: Do not attempt resuscitation (DNR) -DNR-LIMITED -Do Not Intubate/DNI   Family Communication: No family at bedside  Status is: Inpatient Remains inpatient appropriate because: Severity of illness   Level of care: Telemetry  Consultants:  ID Cardiology  Objective: Vitals:   08/28/24 0745 08/28/24 0858 08/28/24 0901 08/28/24 1257  BP: (!) 109/59   105/71  Pulse: 89 90  90  Resp: 18 19  (!) 25  Temp: 97.8 F (36.6 C)   98.1 F (36.7 C)  TempSrc: Oral   Oral  SpO2: 100% 98% 100% 97%  Weight:      Height:        Intake/Output Summary (Last 24 hours) at 08/28/2024 1335 Last data filed at 08/28/2024 0600 Gross per 24 hour  Intake 684.88 ml  Output 1200 ml  Net -515.12 ml    Wt Readings from Last 3 Encounters:  08/28/24 80.8 kg  08/14/24 81.2 kg  08/09/24 79.4 kg    Examination:  Constitutional: NAD Eyes: lids and conjunctivae normal, no scleral icterus ENMT: mmm Neck: normal, supple Respiratory: clear to auscultation bilaterally, no wheezing, no crackles. Normal respiratory effort.  Cardiovascular: Regular rate and rhythm, no murmurs / rubs / gallops. Trace LE edema. Abdomen: soft, no distention, no tenderness. Bowel sounds positive.  Skin: no rashes Neurologic: no focal deficits, equal strength  Data Reviewed: I have independently reviewed  following labs and imaging studies   CBC Recent Labs  Lab 08/23/24 0852 08/24/24 0522 08/26/24 1120 08/27/24 0737 08/28/24 0402  WBC 10.6* 11.9* 17.5* 12.8* 10.3  HGB 13.7 12.8 12.5 11.7* 11.0*  HCT 41.3 38.8 37.2 35.7* 33.1*  PLT 252 228 270 233 260  MCV 92.8 92.6 91.9 94.4 93.0  MCH 30.8 30.5 30.9 31.0 30.9  MCHC 33.2 33.0 33.6 32.8 33.2  RDW 14.7 14.9 15.2 15.7* 15.5  LYMPHSABS 1.3  --   --   --   --   MONOABS 0.5  --   --   --   --   EOSABS 0.1  --   --   --   --   BASOSABS 0.1  --   --   --   --     Recent Labs  Lab 08/22/24 0516 08/23/24 0852 08/24/24 0522 08/26/24 1120 08/27/24 0737 08/28/24 0402  NA 133* 132* 133* 129* 130* 132*  K 4.3 4.6 4.8 5.1 4.5 4.3  CL 100 97* 99 97* 97* 99  CO2 25 27 26 24 22 26   GLUCOSE 112* 120* 117* 128* 174* 95  BUN 9 9 12 13 18 16   CREATININE 0.65 0.70 0.71 0.73 0.92 0.82  CALCIUM  9.5 9.8 9.9 9.6 9.1 8.7*  AST 112*  --  83* 88*  --  64*  ALT 73*  --  55* 48*  --  36  ALKPHOS 234*  --  205* 209*  --  166*  BILITOT 0.6  --  0.7 1.1  --  0.8  ALBUMIN 2.7*  --  2.9* 2.9*  --  2.6*  MG  --   --  1.9 2.1  --  2.0    ------------------------------------------------------------------------------------------------------------------ No results for input(s): CHOL, HDL, LDLCALC, TRIG, CHOLHDL, LDLDIRECT in the last 72 hours.   Lab Results  Component Value Date   HGBA1C 5.9 (H) 08/17/2024   ------------------------------------------------------------------------------------------------------------------ No results for input(s): TSH, T4TOTAL, T3FREE, THYROIDAB in the last 72 hours.  Invalid input(s): FREET3  Cardiac Enzymes No results for input(s): CKMB, TROPONINI, MYOGLOBIN in the last 168 hours.  Invalid input(s): CK ------------------------------------------------------------------------------------------------------------------    Component Value Date/Time   BNP 1,744.7 (H) 08/02/2024 1942    BNP 193 (H) 08/10/2022 1158    CBG: Recent Labs  Lab 08/27/24 1209 08/27/24 1800 08/27/24 2130 08/28/24 0625 08/28/24 1240  GLUCAP 129* 102* 101* 108* 119*    Recent Results (from the past 240 hours)  Culture, blood (Routine X 2) w Reflex to ID Panel     Status: Abnormal   Collection Time: 08/19/24 11:15 AM   Specimen: BLOOD  Result Value Ref Range Status   Specimen Description BLOOD BLOOD RIGHT ARM  Final   Special Requests   Final    BOTTLES DRAWN AEROBIC AND ANAEROBIC Blood Culture results may not be optimal due to an inadequate volume of blood received in culture bottles   Culture  Setup Time   Final    GRAM POSITIVE COCCI IN CLUSTERS IN BOTH AEROBIC AND ANAEROBIC BOTTLES Gram Stain Report Called to,Read Back By and Verified With: PHARMD G ABBOTT 08/20/2024 @ 0552 BY AB CRITICAL VALUE NOTED.  VALUE IS CONSISTENT WITH PREVIOUSLY REPORTED AND CALLED VALUE.    Culture (A)  Final    STAPHYLOCOCCUS AUREUS SUSCEPTIBILITIES PERFORMED ON PREVIOUS CULTURE WITHIN THE LAST 5 DAYS. Performed at [facility], [address], [place], [place] [address]    Report Status 08/22/2024 FINAL  Final  Culture, blood (Routine X 2) w Reflex to ID Panel     Status: Abnormal   Collection Time: 08/19/24 11:15 AM   Specimen: BLOOD LEFT ARM  Result Value Ref Range Status   Specimen Description BLOOD LEFT ARM  Final   Special Requests   Final    BOTTLES DRAWN AEROBIC ONLY Blood Culture results may not be optimal due to an inadequate volume of blood received in culture bottles   Culture  Setup Time   Final    GRAM POSITIVE COCCI IN CLUSTERS AEROBIC BOTTLE ONLY CRITICAL VALUE NOTED.  VALUE IS CONSISTENT WITH PREVIOUSLY REPORTED AND CALLED VALUE.    Culture (A)  Final    STAPHYLOCOCCUS AUREUS SUSCEPTIBILITIES PERFORMED ON PREVIOUS CULTURE WITHIN THE LAST 5 DAYS. Performed at [facility], [address], [place], [place] [address]    Report Status 08/22/2024 FINAL  Final  Culture,  blood (Routine X 2) w Reflex to ID Panel     Status: Abnormal   Collection Time: 08/21/24 10:46 AM   Specimen: BLOOD  Result Value Ref Range Status   Specimen Description BLOOD BLOOD RIGHT ARM  Final   Special Requests   Final    BOTTLES DRAWN AEROBIC AND ANAEROBIC Blood Culture adequate volume   Culture  Setup Time   Final    GRAM POSITIVE COCCI ANAEROBIC BOTTLE ONLY CRITICAL VALUE NOTED.  VALUE IS CONSISTENT WITH PREVIOUSLY REPORTED AND CALLED VALUE.    Culture (A)  Final    STAPHYLOCOCCUS AUREUS SUSCEPTIBILITIES PERFORMED ON PREVIOUS CULTURE WITHIN THE LAST 5 DAYS. Performed at [facility], [address], [place], [place] [address]    Report Status 08/26/2024 FINAL  Final  Culture, blood (Routine X 2) w Reflex to ID Panel     Status: None   Collection Time: 08/21/24 10:46 AM   Specimen: BLOOD  Result Value Ref Range Status   Specimen Description BLOOD BLOOD LEFT HAND  Final   Special Requests   Final    BOTTLES DRAWN AEROBIC AND ANAEROBIC Blood Culture results may not be optimal due to an inadequate volume of blood received in culture bottles   Culture   Final    NO GROWTH 5 DAYS Performed at [facility], [address] [place], [place] [address]    Report Status 08/26/2024 FINAL  Final  Culture, blood (Routine X 2) w Reflex to ID Panel     Status: Abnormal (Preliminary result)  Collection Time: 08/25/24 10:03 AM   Specimen: BLOOD  Result Value Ref Range Status   Specimen Description BLOOD BLOOD RIGHT HAND  Final   Special Requests   Final    AEROBIC BOTTLE ONLY Blood Culture results may not be optimal due to an inadequate volume of blood received in culture bottles   Culture  Setup Time   Final    GRAM POSITIVE COCCI AEROBIC BOTTLE ONLY CRITICAL VALUE NOTED.  VALUE IS CONSISTENT WITH PREVIOUSLY REPORTED AND CALLED VALUE.    Culture (A)  Final    STAPHYLOCOCCUS AUREUS SUSCEPTIBILITIES TO FOLLOW Performed at [facility], [address],  [place], [place] [address]    Report Status PENDING  Incomplete  Culture, blood (Routine X 2) w Reflex to ID Panel     Status: None (Preliminary result)   Collection Time: 08/25/24 10:03 AM   Specimen: BLOOD  Result Value Ref Range Status   Specimen Description BLOOD BLOOD LEFT HAND  Final   Special Requests AEROBIC BOTTLE ONLY Blood Culture adequate volume  Final   Culture   Final    NO GROWTH 3 DAYS Performed at [facility], [address], [place], [place] [address]    Report Status PENDING  Incomplete     Radiology Studies: No results found.     Nilda Fendt, MD, PhD [facility]  Between 7 am - 7 pm I am available, please contact me via Amion (for emergencies) or Securechat (non urgent messages)  Between 7 pm - 7 am I am not available, please contact night coverage MD/APP via Amion  "

## 2024-08-28 NOTE — Progress Notes (Signed)
 Occupational Therapy Treatment Patient Details Name: Tanya Hogan MRN: [id number] DOB: 04-Jun-1951 Today's Date: 08/28/2024   History of present illness Pt is 74 yo with recent hospitalization on 12/11 with swelling and difficulty breathing. Currently pt was admitted 12/24 for acute on chronic HF. 12/25 was minimally responsive requiring bipap. Blood cultures showed MRSA. Had facial droop with numbness on 08/24/24 with code stroke called and CT showing age indeterminate R thalamic lacunar infarct, (unable to do MRI due to PPM).  EFY:[id number] revision of pacemaker/AICD, breast cancer, chronic heart failure, nonalcoholic cirrhosis, BiV pacemake, depression, DM2, GERD, HTN, hypothyroidism, valvular heart disease, pulmonary hypertension, sleep apnea and ventricular tachy arrhythmia   OT comments  Patient seen in order to increase overall activity tolerance. Patient with need for increased time to transition to EOB, but able to complete at [facility], however unable to come into standing without increased assist. Patient able to stand x3 with min A and momentum, but unable to maintain static standing for longer than a minute. Increased difficulty to weight shift. Recommendation downgraded to rehab of a lesser intensity < 3 hours given current needs and patient would require 24/7 assist at her current level. OT will continue to follow acutely.   CCMD contacted prior to session      If plan is discharge home, recommend the following:  A lot of help with walking and/or transfers;A lot of help with bathing/dressing/bathroom;Assistance with cooking/housework;Assist for transportation;Direct supervision/assist for financial management;Direct supervision/assist for medications management   Equipment Recommendations  None recommended by OT    Recommendations for Other Services      Precautions / Restrictions Precautions Precautions: Fall Recall of Precautions/Restrictions: Impaired Precaution/Restrictions Comments:  watch BP Restrictions Weight Bearing Restrictions Per Provider Order: No       Mobility Bed Mobility Overal bed mobility: Needs Assistance Bed Mobility: Supine to Sit, Sit to Supine     Supine to sit: Used rails, HOB elevated, Supervision Sit to supine: Mod assist   General bed mobility comments: Assist to return BLEs back into bed and reposition trunk    Transfers Overall transfer level: Needs assistance Equipment used: Rolling Thum (2 wheels) Transfers: Sit to/from Stand Sit to Stand: Min assist           General transfer comment: min A x3 to come into standing, increased effort to weight shift, increase difficulty to attempt steps toward [facility]     Balance Overall balance assessment: Needs assistance Sitting-balance support: Bilateral upper extremity supported, Feet supported Sitting balance-[name] Scale: Fair     Standing balance support: Bilateral upper extremity supported, During functional activity, Reliant on assistive device for balance Standing balance-[name] Scale: Poor Standing balance comment: UE support on RW                           ADL either performed or assessed with clinical judgement   ADL Overall ADL's : Needs assistance/impaired Eating/Feeding: Set up;Sitting   Grooming: Wash/dry hands;Wash/dry face;Oral care;Brushing hair;Set up;Sitting       Lower Body Bathing: Maximal assistance;Sitting/lateral leans;Sit to/from stand       Lower Body Dressing: Moderate assistance;Sitting/lateral leans;Sit to/from stand   Toilet Transfer: Minimal assistance;Moderate assistance;BSC/3in1;Rolling [place] (2 wheels)   Toileting- Clothing Manipulation and Hygiene: Total assistance;Sitting/lateral lean;Sit to/from stand       Functional mobility during ADLs: Moderate assistance;Cueing for safety;Cueing for sequencing;Rolling [place] (2 wheels) General ADL Comments: Patient seen in order to increase overall activity tolerance. Patient with need  for increased time to transition to EOB, but able to complete at [facility], however unable to come into standing without increased assist. Patient able to stand x3 with min A and momentum, but unable to maintain static standing for longer than a minute. Increased difficulty to weight shift. Recommendation downgraded to rehab of a lesser intensity < 3 hours given current needs and patient would require 24/7 assist at her current level. OT will continue to follow acutely.    Extremity/Trunk Assessment              Occupational Psychologist Communication: No apparent difficulties   Cognition Arousal: Alert Behavior During Therapy: WFL for tasks assessed/performed Cognition: Cognition impaired     Awareness: Online awareness impaired   Attention impairment (select first level of impairment): Sustained attention Executive functioning impairment (select all impairments): Problem solving OT - Cognition Comments: Decreased attention, but oriented                 Following commands: Intact        Cueing   Cueing Techniques: Verbal cues  Exercises      Shoulder Instructions       General Comments VSS on RA    Pertinent Vitals/ Pain          Home Living                                          Prior Functioning/Environment              Frequency  Min 2X/week        Progress Toward Goals  OT Goals(current goals can now be found in the care plan section)  Progress towards OT goals: Progressing toward goals  Acute Rehab OT Goals Patient Stated Goal: to get better OT Goal Formulation: With patient Time For Goal Achievement: 09/01/24 Potential to Achieve Goals: Fair  Plan      Co-evaluation                 AM-PAC OT 6 Clicks Daily Activity     Outcome Measure   Help from another person eating meals?: None Help from another person taking care of personal grooming?: A Little Help  from another person toileting, which includes using toliet, bedpan, or urinal?: A Lot Help from another person bathing (including washing, rinsing, drying)?: A Lot Help from another person to put on and taking off regular upper body clothing?: A Little Help from another person to put on and taking off regular lower body clothing?: A Lot 6 Click Score: 16    End of Session Equipment Utilized During Treatment: Gait belt;Rolling [place] (2 wheels);Oxygen   OT Visit Diagnosis: Muscle weakness (generalized) (M62.81)   Activity Tolerance Patient limited by fatigue   Patient Left in bed;with call bell/phone within reach;with bed alarm set   Nurse Communication Mobility status        Time: 8652-8571 OT Time Calculation (min): 41 min  Charges: OT General Charges $OT Visit: 1 Visit OT Treatments $Self Care/Home Management : 38-52 mins  Ronal Gift E. Melane Windholz, OTR/L Acute Rehabilitation Services [phone]   Ronal Gift Salt 08/28/2024, 3:31 PM

## 2024-08-28 NOTE — Progress Notes (Signed)
" °   08/27/24 2100  Mobility (See group info for [facility] equipment and weight capacities recommendations)  HOB Elevated/Bed Position Self regulated  Activity Ambulated with assistance;Pivoted/transferred to/from [facility]  Range of Motion/Exercises Active Assistive;All extremities  Level of Assistance Standby assist, set-up cues, supervision of patient - no hands on  Assistive Device BSC;Front wheel [place]  Distance Ambulated (ft) 6 ft  Activity Response Tolerated fair  Transport method Ambulatory  Adult Inpatient Mobility Assessment: Perform at admission/transfer/every shift/prn  Does the patient have exclusion criteria? No- Perform mobility assessment  What is the highest level of mobility based on the mobility assessment? Level 3 (Stands with assistance) - Balance while standing  and cannot march in place  Is the above level different from baseline mobility prior to current illness? Yes - Recommend PT order   Wendi Dash, RN  "

## 2024-08-28 NOTE — Telephone Encounter (Signed)
 Pharmacy Patient Advocate Encounter  Insurance verification completed.    The patient is insured through [facility]. Patient has Medicare and is not eligible for a copay card, but may be able to apply for patient assistance or Medicare RX Payment Plan (Patient Must reach out to their plan, if eligible for payment plan), if available.    Ran test claim for Eliquis  5mg  tablet and the current 30 day co-pay is $12.65.   This test claim was processed through [facility]- copay amounts may vary at other pharmacies due to pharmacy/plan contracts, or as the patient moves through the different stages of their insurance plan.

## 2024-08-29 ENCOUNTER — Telehealth: Payer: Self-pay

## 2024-08-29 ENCOUNTER — Inpatient Hospital Stay (HOSPITAL_COMMUNITY)

## 2024-08-29 DIAGNOSIS — B9562 Methicillin resistant Staphylococcus aureus infection as the cause of diseases classified elsewhere: Secondary | ICD-10-CM | POA: Diagnosis not present

## 2024-08-29 DIAGNOSIS — Z515 Encounter for palliative care: Secondary | ICD-10-CM | POA: Diagnosis not present

## 2024-08-29 DIAGNOSIS — I509 Heart failure, unspecified: Secondary | ICD-10-CM | POA: Diagnosis not present

## 2024-08-29 DIAGNOSIS — R7881 Bacteremia: Secondary | ICD-10-CM | POA: Diagnosis not present

## 2024-08-29 DIAGNOSIS — Z7189 Other specified counseling: Secondary | ICD-10-CM | POA: Diagnosis not present

## 2024-08-29 LAB — CBC
HCT: 33.9 % — ABNORMAL LOW (ref 36.0–46.0)
Hemoglobin: 11.2 g/dL — ABNORMAL LOW (ref 12.0–15.0)
MCH: 30.8 pg (ref 26.0–34.0)
MCHC: 33 g/dL (ref 30.0–36.0)
MCV: 93.1 fL (ref 80.0–100.0)
Platelets: 262 K/uL (ref 150–400)
RBC: 3.64 MIL/uL — ABNORMAL LOW (ref 3.87–5.11)
RDW: 15.8 % — ABNORMAL HIGH (ref 11.5–15.5)
WBC: 9.5 K/uL (ref 4.0–10.5)
nRBC: 0 % (ref 0.0–0.2)

## 2024-08-29 LAB — URINALYSIS, ROUTINE W REFLEX MICROSCOPIC
Bacteria, UA: NONE SEEN
Bilirubin Urine: NEGATIVE
Glucose, UA: NEGATIVE mg/dL
Ketones, ur: NEGATIVE mg/dL
Leukocytes,Ua: NEGATIVE
Nitrite: NEGATIVE
Protein, ur: 30 mg/dL — AB
Specific Gravity, Urine: 1.038 — ABNORMAL HIGH (ref 1.005–1.030)
pH: 5 (ref 5.0–8.0)

## 2024-08-29 LAB — COMPREHENSIVE METABOLIC PANEL WITH GFR
ALT: 34 U/L (ref 0–44)
AST: 60 U/L — ABNORMAL HIGH (ref 15–41)
Albumin: 2.7 g/dL — ABNORMAL LOW (ref 3.5–5.0)
Alkaline Phosphatase: 153 U/L — ABNORMAL HIGH (ref 38–126)
Anion gap: 8 (ref 5–15)
BUN: 16 mg/dL (ref 8–23)
CO2: 26 mmol/L (ref 22–32)
Calcium: 8.3 mg/dL — ABNORMAL LOW (ref 8.9–10.3)
Chloride: 98 mmol/L (ref 98–111)
Creatinine, Ser: 0.85 mg/dL (ref 0.44–1.00)
GFR, Estimated: >60 mL/min
Glucose, Bld: 95 mg/dL (ref 70–99)
Potassium: 4.1 mmol/L (ref 3.5–5.1)
Sodium: 133 mmol/L — ABNORMAL LOW (ref 135–145)
Total Bilirubin: 0.9 mg/dL (ref 0.0–1.2)
Total Protein: 5.7 g/dL — ABNORMAL LOW (ref 6.5–8.1)

## 2024-08-29 LAB — CULTURE, BLOOD (ROUTINE X 2)

## 2024-08-29 LAB — HEPATITIS PANEL, ACUTE
HCV Ab: NONREACTIVE
Hep A IgM: NONREACTIVE
Hep B C IgM: NONREACTIVE
Hepatitis B Surface Ag: NONREACTIVE

## 2024-08-29 LAB — GLUCOSE, CAPILLARY: Glucose-Capillary: 96 mg/dL (ref 70–99)

## 2024-08-29 LAB — MAGNESIUM: Magnesium: 2 mg/dL (ref 1.7–2.4)

## 2024-08-29 LAB — HEPATITIS B SURFACE ANTIBODY,QUALITATIVE: Hep B S Ab: NONREACTIVE

## 2024-08-29 MED ORDER — POLYVINYL ALCOHOL 1.4 % OP SOLN: 1.0000 [drp] | Freq: Four times a day (QID) | OPHTHALMIC | Status: DC | PRN

## 2024-08-29 MED ORDER — LORAZEPAM 2 MG/ML IJ SOLN: 1.0000 mg | INTRAMUSCULAR | Status: DC | PRN

## 2024-08-29 MED ORDER — GLYCOPYRROLATE 0.2 MG/ML IJ SOLN: 0.2000 mg | INTRAMUSCULAR | Status: DC | PRN

## 2024-08-29 MED ORDER — BIOTENE DRY MOUTH MT LIQD: 15.0000 mL | OROMUCOSAL | Status: DC | PRN

## 2024-08-29 MED ORDER — LORAZEPAM 1 MG PO TABS
1.0000 mg | ORAL_TABLET | ORAL | Status: DC | PRN
  Administered 2024-08-29 – 2024-08-30 (×2): 1 mg via ORAL
  Filled 2024-08-29 (×2): qty 1

## 2024-08-29 MED ORDER — HYDROCODONE-ACETAMINOPHEN 5-325 MG PO TABS: 1.0000 | ORAL_TABLET | ORAL | Status: DC | PRN

## 2024-08-29 MED ORDER — GLYCOPYRROLATE 1 MG PO TABS: 1.0000 mg | ORAL_TABLET | ORAL | Status: DC | PRN

## 2024-08-29 MED ORDER — IOHEXOL 350 MG/ML SOLN
75.0000 mL | Freq: Once | INTRAVENOUS | Status: AC | PRN
  Administered 2024-08-29: 75 mL via INTRAVENOUS

## 2024-08-29 MED ORDER — DIPHENHYDRAMINE HCL 50 MG/ML IJ SOLN: 12.5000 mg | INTRAMUSCULAR | Status: DC | PRN

## 2024-08-29 MED ORDER — MORPHINE SULFATE (PF) 2 MG/ML IV SOLN: 0.5000 mg | INTRAVENOUS | Status: DC | PRN

## 2024-08-29 MED ORDER — LORAZEPAM 2 MG/ML PO CONC: 1.0000 mg | ORAL | Status: DC | PRN

## 2024-08-29 NOTE — Plan of Care (Signed)
 Id brief note   08/28/24 abd pelv ct no visceral or intraabdominal abscess from persistent mrsa bsi 08/29/24 hepatitis panel negative    A/p Persistent mrsa bsi Pacer present Rash -- suspect lcv due to mrsa    -continue dapto/ceftaroline  and f/u repeat bcx; if repeat bcx negative will do 2 weeks before reverting back to monotherapy -maintain contact isolation precaution

## 2024-08-29 NOTE — Telephone Encounter (Signed)
 Patients daughter came into office to drop off [employer] paperwork to be completed by PCP, forms placed in PCP's box, thanks.

## 2024-08-29 NOTE — Progress Notes (Signed)
 " PROGRESS NOTE  Tanya Hogan FMW:[id number] DOB: [birth year] DOA: 08/16/2024 PCP: [name], MD   LOS: 13 days   Brief Narrative / Interim history: 74 year old female with history of chronic systolic CHF, HTN, PVD, DM2, presence of pacemaker, hypothyroidism comes into the hospital with shortness of breath.  Of note, she was recently hospitalized mid-December for CHF exacerbation, sent home after diuresis, but developed recurrent dyspnea and lower extremity edema at home and came back to the ED.  She was found to have MRSA bacteremia.  ID consulted and following.  Due to presence of pacemaker cardiology consulted as well.  She is deemed to be a very poor candidate for pacer lead extraction given end-stage heart failure and poor prognosis.  Palliative consulted as well  Subjective / 24h Interval events: Complains of leg pain, otherwise feels okay.  Denies any chest pain, no shortness of breath  Assesement and Plan: Principal problem MRSA bacteremia -ID following, appreciate input.  Patient was admitted to the hospital and was found to have MRSA bacteremia.  She has a pacemaker in place.  ID, cardiology were consulted.  She was evaluated and not deemed a surgical candidate for pacemaker removal - Attempts were made to treat the infection initially with IV antibiotics for 4 weeks followed by lifelong suppression given presence of pacemaker and inability to remove.  However, her hospital course has been complicated by inability to clear the infection and subsequent blood cultures, and repeat surveillance cultures on 12/25, 12/27, 12/29 and 1/2 are all positive for MRSA.  This overall confers a very poor prognosis.  ID added ceftaroline  yesterday and a new set of cultures was obtained - Palliative consulted and following as well  Active problems Acute onset generalized weakness - on 08/24/2024.  A code stroke was called, she was evaluated at the bedside by the neurology team.  A CT of the  head showed a small age-indeterminate lacunar infarct in the right thalamus.  Pete CT scan at 24 hours without change.  Neurology does not think this was a stroke stroke.  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  Overall improving  Acute on chronic systolic CHF -most recent 2D echo showed LVEF less than 20%, global hypokinesis, severe LV dilatation, grade 3 diastolic dysfunction, RV systolic function moderate reduction, RVSP 48, biatrial severe dilatation and small pericardial effusion.  She was diuresed - Neurology following  Left upper extremity, left IJ vein thrombosis-on Eliquis   CHB-with pacemaker.  Recently had subsequent addition of RV-the lead on October 2025 for ATP for refractory VT  History of VT-no arrhythmias.  Continue suppressive treatment with amiodarone  as well as quinidine  Hypothyroidism -continue Synthroid   Essential hypertension-continue spironolactone   NASH cirrhosis-no signs of decompensation  Underlying COPD-no wheezing, stable, continue bronchodilator therapy  History of left breast cancer-outpatient follow-up, in remission  Microcytic anemia-continue to monitor, no bleeding  Hyponatremia-overall stable, no large shifts noted.  Sodium 132  Obesity, class I-BMI 33.3.    Goals of care-EP following for consideration for pacemaker removal.  Dr. Almetta has been evaluating patient few times, including this morning and he had a long discussion with the patient as well as the patient's daughter.  I have discussed with Dr. Almetta as well today.  It appears that, despite intravenous antibiotics, she is continue to show evidence of persistent MRSA bacteremia and it is very likely that her pacemaker is infected.  Unfortunately it cannot be removed.  In discussion with cardiology today, it appears that patient is open to hospice and discontinuation of all antibiotics to allow a  natural death, however she wishes to see her darra which is currently 70 months old.  Current hospital policy prevents young children from visiting, I have discussed with the charge nurse on 4 E, and in this case, given end-of-life issues an exception may be made.  I will discuss with palliative regarding this plan.  Another option would be for patient to move to residential hospice, discontinue antibiotics, and have the extended family visit her there.  Will ask palliative to see if she is a residential hospice candidate  Scheduled Meds:  amiodarone   200 mg Oral Daily   apixaban   10 mg Oral BID   Followed by   NOREEN ON 09/06/2024] apixaban   5 mg Oral BID   arformoterol   15 mcg Nebulization BID   aspirin   81 mg Oral Daily   budesonide  (PULMICORT ) nebulizer solution  0.25 mg Nebulization BID   Chlorhexidine  Gluconate Cloth  6 each Topical Daily   insulin  aspart  0-5 Units Subcutaneous QHS   insulin  aspart  0-9 Units Subcutaneous TID WC   lactobacillus  1 g Oral TID WC   levothyroxine   112 mcg Oral Daily   multivitamin with minerals  1 tablet Oral Daily   nystatin   5 mL Oral QID   mouth rinse  15 mL Mouth Rinse 4 times per day   pantoprazole   40 mg Oral QAC breakfast   pravastatin   80 mg Oral QHS   quiNIDine sulfate   200 mg Oral TID   revefenacin   175 mcg Nebulization Daily   sodium chloride  flush  3 mL Intravenous Q12H   sodium chloride  flush  3 mL Intravenous Q12H   spironolactone   12.5 mg Oral Daily   torsemide   40 mg Oral Daily   Continuous Infusions:  ceFTAROline  (TEFLARO ) IV 600 mg (08/29/24 0848)   DAPTOmycin  500 mg (08/28/24 1319)   PRN Meds:.acetaminophen  **OR** acetaminophen , guaiFENesin -dextromethorphan , HYDROcodone -acetaminophen , levalbuterol , menthol , ondansetron  **OR** ondansetron  (ZOFRAN ) IV, mouth rinse, senna-docusate, traZODone   Current Outpatient Medications  Medication Instructions   albuterol  (PROVENTIL ) 2.5 mg, Nebulization, Daily PRN   albuterol   (VENTOLIN  HFA) 108 (90 Base) MCG/ACT inhaler 2 puffs, Inhalation, Every 6 hours PRN   alendronate  (FOSAMAX ) 70 MG tablet TAKE 1 TABLET BY MOUTH EVERY 7  DAYS WITH FULL GLASS OF WATER  ON AN EMPTY STOMACH   amiodarone  (PACERONE ) 200 mg, Oral, Daily   aspirin  81 mg, Oral, Daily   Calcium  Carbonate-Vit D-Min (CALCIUM  600+D PLUS MINERALS) 600-400 MG-UNIT TABS 1 tab p.o. twice daily   EPINEPHrine  (EPI-PEN) 0.3 mg, Intramuscular, As needed   fexofenadine  (ALLEGRA ) 180 mg, Oral, Daily   fluticasone -salmeterol (WIXELA INHUB) 250-50 MCG/ACT AEPB 1 puff, Inhalation, 2 times daily   levothyroxine  (SYNTHROID ) 112 mcg, Oral, Daily   metFORMIN  (GLUCOPHAGE -XR) 500 mg, Oral, Daily, Per 08/09/24 discharge states 1 tablet twice daily - patient states PCP has her on this once daily and they plan to discuss once or twice daily at 08/14/24 appt   Multiple Vitamins-Minerals (MULTIVITAMIN WOMEN 50+) TABS 1 tablet, Daily   pantoprazole  (PROTONIX ) 40 mg, Oral, Daily before breakfast   pravastatin  (PRAVACHOL ) 40 mg, Oral, Daily at bedtime   quiNIDine sulfate  200 mg, Oral, 3 times daily   SPIRIVA  RESPIMAT 2.5 MCG/ACT AERS 2 puffs, Inhalation, Daily   spironolactone  (ALDACTONE ) 12.5 mg, Oral, Daily   torsemide  (DEMADEX ) 20 mg, Oral, Daily, Take  2 tablets daily in case of weight gain 2 to 3 lbs in 24 hrs or 5 lbs in 7 days   valACYclovir  (VALTREX ) 1,000 mg, Oral, 2 times daily    Diet Orders (From admission, onward)     Start     Ordered   08/20/24 1220  Diet Heart Room service appropriate? Yes with Assist; Fluid consistency: Thin  Diet effective now       Question Answer Comment  Room service appropriate? Yes with Assist   Fluid consistency: Thin      08/20/24 1219            DVT prophylaxis: apixaban  (ELIQUIS ) tablet 5 mg Start: 09/10/2024 1000 apixaban  (ELIQUIS ) tablet 10 mg Start: 08/27/24 1230 SCDs Start: 08/16/24 1020 apixaban  (ELIQUIS ) tablet 10 mg  apixaban  (ELIQUIS ) tablet 5 mg   Lab Results   Component Value Date   PLT 262 08/29/2024      Code Status: Limited: Do not attempt resuscitation (DNR) -DNR-LIMITED -Do Not Intubate/DNI   Family Communication: No family at bedside  Status is: Inpatient Remains inpatient appropriate because: Severity of illness   Level of care: Telemetry  Consultants:  ID Cardiology  Objective: Vitals:   08/28/24 2329 08/29/24 0620 08/29/24 0750 08/29/24 1000  BP: 116/62  (!) 103/56   Pulse:  89 89   Resp: 20 (!) 29 20 20   Temp: 97.9 F (36.6 C)  97.9 F (36.6 C)   TempSrc: Oral  Oral   SpO2: 97% 95% 99%   Weight:  82.3 kg    Height:        Intake/Output Summary (Last 24 hours) at 08/29/2024 1126 Last data filed at 08/29/2024 1000 Gross per 24 hour  Intake 630 ml  Output --  Net 630 ml    Wt Readings from Last 3 Encounters:  08/29/24 82.3 kg  08/14/24 81.2 kg  08/09/24 79.4 kg    Examination:  Constitutional: NAD Eyes: lids and conjunctivae normal, no scleral icterus ENMT: mmm Neck: normal, supple Respiratory: clear to auscultation bilaterally, no wheezing, no crackles. Normal respiratory effort.  Cardiovascular: Regular rate and rhythm, no murmurs / rubs / gallops. No LE edema. Abdomen: soft, no distention, no tenderness. Bowel sounds positive.   Data Reviewed: I have independently reviewed following labs and imaging studies   CBC Recent Labs  Lab 08/23/24 0852 08/24/24 0522 08/26/24 1120 08/27/24 0737 08/28/24 0402 08/29/24 0349  WBC 10.6* 11.9* 17.5* 12.8* 10.3 9.5  HGB 13.7 12.8 12.5 11.7* 11.0* 11.2*  HCT 41.3 38.8 37.2 35.7* 33.1* 33.9*  PLT 252 228 270 233 260 262  MCV 92.8 92.6 91.9 94.4 93.0 93.1  MCH 30.8 30.5 30.9 31.0 30.9 30.8  MCHC 33.2 33.0 33.6 32.8 33.2 33.0  RDW 14.7 14.9 15.2 15.7* 15.5 15.8*  LYMPHSABS 1.3  --   --   --   --   --   MONOABS 0.5  --   --   --   --   --   EOSABS 0.1  --   --   --   --   --   BASOSABS 0.1  --   --   --   --   --     Recent Labs  Lab 08/24/24 0522  08/26/24 1120 08/27/24 0737 08/28/24 0402 08/29/24 0349  NA 133* 129* 130* 132* 133*  K 4.8 5.1 4.5 4.3 4.1  CL 99 97* 97* 99 98  CO2 26 24 22 26 26   GLUCOSE 117* 128* 174*  95 95  BUN 12 13 18 16 16   CREATININE 0.71 0.73 0.92 0.82 0.85  CALCIUM  9.9 9.6 9.1 8.7* 8.3*  AST 83* 88*  --  64* 60*  ALT 55* 48*  --  36 34  ALKPHOS 205* 209*  --  166* 153*  BILITOT 0.7 1.1  --  0.8 0.9  ALBUMIN 2.9* 2.9*  --  2.6* 2.7*  MG 1.9 2.1  --  2.0 2.0    ------------------------------------------------------------------------------------------------------------------ No results for input(s): CHOL, HDL, LDLCALC, TRIG, CHOLHDL, LDLDIRECT in the last 72 hours.   Lab Results  Component Value Date   HGBA1C 5.9 (H) 08/17/2024   ------------------------------------------------------------------------------------------------------------------ No results for input(s): TSH, T4TOTAL, T3FREE, THYROIDAB in the last 72 hours.  Invalid input(s): FREET3  Cardiac Enzymes No results for input(s): CKMB, TROPONINI, MYOGLOBIN in the last 168 hours.  Invalid input(s): CK ------------------------------------------------------------------------------------------------------------------    Component Value Date/Time   BNP 1,744.7 (H) 08/02/2024 1942   BNP 193 (H) 08/10/2022 1158    CBG: Recent Labs  Lab 08/28/24 0625 08/28/24 1240 08/28/24 1542 08/28/24 2106 08/29/24 0619  GLUCAP 108* 119* 121* 97 96    Recent Results (from the past 240 hours)  Culture, blood (Routine X 2) w Reflex to ID Panel     Status: Abnormal   Collection Time: 08/21/24 10:46 AM   Specimen: BLOOD  Result Value Ref Range Status   Specimen Description BLOOD BLOOD RIGHT ARM  Final   Special Requests   Final    BOTTLES DRAWN AEROBIC AND ANAEROBIC Blood Culture adequate volume   Culture  Setup Time   Final    GRAM POSITIVE COCCI ANAEROBIC BOTTLE ONLY CRITICAL VALUE NOTED.  VALUE IS CONSISTENT  WITH PREVIOUSLY REPORTED AND CALLED VALUE.    Culture (A)  Final    STAPHYLOCOCCUS AUREUS SUSCEPTIBILITIES PERFORMED ON PREVIOUS CULTURE WITHIN THE LAST 5 DAYS. Performed at [facility], [address] [place], [place] [address]    Report Status 08/26/2024 FINAL  Final  Culture, blood (Routine X 2) w Reflex to ID Panel     Status: None   Collection Time: 08/21/24 10:46 AM   Specimen: BLOOD  Result Value Ref Range Status   Specimen Description BLOOD BLOOD LEFT HAND  Final   Special Requests   Final    BOTTLES DRAWN AEROBIC AND ANAEROBIC Blood Culture results may not be optimal due to an inadequate volume of blood received in culture bottles   Culture   Final    NO GROWTH 5 DAYS Performed at [facility], [address], [place], [place] [address]    Report Status 08/26/2024 FINAL  Final  Culture, blood (Routine X 2) w Reflex to ID Panel     Status: Abnormal   Collection Time: 08/25/24 10:03 AM   Specimen: BLOOD  Result Value Ref Range Status   Specimen Description BLOOD BLOOD RIGHT HAND  Final   Special Requests   Final    AEROBIC BOTTLE ONLY Blood Culture results may not be optimal due to an inadequate volume of blood received in culture bottles   Culture  Setup Time   Final    GRAM POSITIVE COCCI AEROBIC BOTTLE ONLY CRITICAL VALUE NOTED.  VALUE IS CONSISTENT WITH PREVIOUSLY REPORTED AND CALLED VALUE. Performed at [facility], [address] [place], [place] [address]    Culture METHICILLIN RESISTANT STAPHYLOCOCCUS AUREUS (A)  Final   Report Status 08/29/2024 FINAL  Final   Organism ID, Bacteria METHICILLIN  RESISTANT STAPHYLOCOCCUS AUREUS  Final      Susceptibility   Methicillin resistant staphylococcus aureus - MIC*    CIPROFLOXACIN  >=8 RESISTANT Resistant     ERYTHROMYCIN  >=8 RESISTANT Resistant     GENTAMICIN  <=0.5 SENSITIVE Sensitive     OXACILLIN >=4 RESISTANT Resistant     TETRACYCLINE <=1 SENSITIVE Sensitive     VANCOMYCIN  <=0.5 SENSITIVE  Sensitive     TRIMETH/SULFA <=10 SENSITIVE Sensitive     CLINDAMYCIN  <=0.25 SENSITIVE Sensitive     RIFAMPIN <=0.5 SENSITIVE Sensitive     Inducible Clindamycin  NEGATIVE Sensitive     LINEZOLID 2 SENSITIVE Sensitive     * METHICILLIN RESISTANT STAPHYLOCOCCUS AUREUS  Culture, blood (Routine X 2) w Reflex to ID Panel     Status: None (Preliminary result)   Collection Time: 08/25/24 10:03 AM   Specimen: BLOOD  Result Value Ref Range Status   Specimen Description BLOOD BLOOD LEFT HAND  Final   Special Requests AEROBIC BOTTLE ONLY Blood Culture adequate volume  Final   Culture   Final    NO GROWTH 4 DAYS Performed at [facility], [address] [place], [place] [address]    Report Status PENDING  Incomplete  MIC (1 Drug)-     Status: None (Preliminary result)   Collection Time: 08/25/24 10:03 AM  Result Value Ref Range Status   Min Inhibitory Conc (1 Drug) PENDING  Incomplete   Source BLOOD CULTURE  Final    Comment: Performed at [facility], [address], [place], [place] [address]  Culture, blood (Routine X 2) w Reflex to ID Panel     Status: None (Preliminary result)   Collection Time: 08/28/24 11:47 AM   Specimen: BLOOD LEFT ARM  Result Value Ref Range Status   Specimen Description BLOOD LEFT ARM  Final   Special Requests   Final    BOTTLES DRAWN AEROBIC ONLY Blood Culture adequate volume   Culture   Final    NO GROWTH < 24 HOURS Performed at [facility], [address], [place], [place] [address]    Report Status PENDING  Incomplete  Culture, blood (Routine X 2) w Reflex to ID Panel     Status: None (Preliminary result)   Collection Time: 08/28/24 11:52 AM   Specimen: BLOOD RIGHT HAND  Result Value Ref Range Status   Specimen Description BLOOD RIGHT HAND  Final   Special Requests   Final    BOTTLES DRAWN AEROBIC ONLY Blood Culture results may not be optimal due to an inadequate volume of blood received in culture bottles   Culture   Final    NO  GROWTH < 24 HOURS Performed at [facility], [address], [place], [place] [address]    Report Status PENDING  Incomplete     Radiology Studies: CT ABDOMEN PELVIS W CONTRAST Result Date: 08/29/2024 EXAM: CT ABDOMEN AND PELVIS WITH CONTRAST 08/29/2024 01:11:57 AM TECHNIQUE: CT of the abdomen and pelvis was performed with the administration of intravenous contrast. 75 mL (iohexol  (OMNIPAQUE ) 350 MG/ML injection 75 mL IOHEXOL  350 MG/ML SOLN) was administered. Multiplanar reformatted images are provided for review. Automated exposure control, iterative reconstruction, and/or weight-based adjustment of the mA/kV was utilized to reduce the radiation dose to as low as reasonably achievable. COMPARISON: CT with iv contrast 11/29/2023 and 07/13/2024. CLINICAL HISTORY: Persistent bacteremia. FINDINGS: LOWER CHEST: Small symmetric layering pleural effusions, both increased. Posterior basilar opacities which could be due to atelectasis or  aspiration with additional hazy lower lobe opacities which could be from low lung volumes or pneumonitis versus edema. The more anterior lung bases are clear. There is severe cardiomegaly. Small but circumferential pericardial effusion. There is pacemaker and AICD wiring in the heart. LIVER: Capsular nodularity of the left lobe of the liver is again noted suggesting at least early cirrhosis. The liver is 20 cm in length with no mass enhancement. GALLBLADDER AND BILE DUCTS: There is a subcentimeter stone in the distal gallbladder, surrounded by dense sludge. No gallbladder wall thickening or biliary dilatation. SPLEEN: No acute abnormality. PANCREAS: The pancreas is partially atrophic without mass. ADRENAL GLANDS: No acute abnormality. KIDNEYS, URETERS AND BLADDER: No stones in the kidneys or ureters. No hydronephrosis. No perinephric or periureteral stranding. There is no renal mass. There is a 2.1 cm parapelvic Bosniak 1 cyst in the inferior pole of the left kidney, Hounsfield  density is 19. No follow-up imaging recommended. Urinary bladder is unremarkable. GI AND BOWEL: Stomach demonstrates no acute abnormality. The appendix is normal. There is mild retained stool in the ascending and transverse colon. Left colonic diverticulosis greatest in sigmoid colon. No evidence of acute diverticulitis. There is interval new thickening in the rectum with perirectal stranding, most likely due to proctitis given the short interval appearance of this. Infiltrating disease would be less likely. There is no bowel obstruction. PERITONEUM AND RETROPERITONEUM: There is trace ascites in the presacral pelvis. No free air. VASCULATURE: Aorta is normal in caliber. Aortic atherosclerosis. LYMPH NODES: No lymphadenopathy. REPRODUCTIVE ORGANS: No acute abnormality. BONES AND SOFT TISSUES: Osteopenia and degenerative change Thoracic and lumbar spine. Continued body wall edema in the flanks and lateral hips. Post injection changes in the abdominal wall subcutaneous fat. There are small inguinal fat hernias. No acute osseous abnormality. No focal soft tissue abnormality. IMPRESSION: 1. Interval new thickening in the rectum with perirectal stranding, most likely due to proctitis given the short interval appearance. Infiltrating disease is less likely. 2. Posterior basilar opacities and additional hazy lower lobe opacities, which could be due to atelectasis or aspiration, with pneumonitis versus edema also considered. 3. Small symmetric layering pleural effusions, increased compared to prior study. 4. Severe cardiomegaly with small circumferential pericardial effusion. 5. Trace ascites in the presacral pelvis. 6. Early cirrhotic changes in the liver. No splenomegaly. 7. Cholelithiasis. 8. Constipation and diverticulosis. Electronically signed by: Francis Quam MD 08/29/2024 07:49 AM EST RP Workstation: [id number]       Nilda Fendt, MD, PhD [facility]  Between 7 am - 7 pm I am available, please  contact me via Amion (for emergencies) or Securechat (non urgent messages)  Between 7 pm - 7 am I am not available, please contact night coverage MD/APP via Amion  "

## 2024-08-29 NOTE — TOC Progression Note (Addendum)
 Transition of Care (TOC) - Progression Note  Rayfield Gobble RN, BSN Inpatient Care Management Unit 4E- RN Case Manager See Treatment Team for direct phone #   Patient Details  Name: Tanya Hogan MRN: [id number] Date of Birth: [birth year]  Transition of Care [facility]) CM/SW Contact  Gobble, Rayfield Hurst, RN Phone Number: 08/29/2024, 2:24 PM  Clinical Narrative:    Noted consult for Residential/INPT Hospice- family prefers [facility].   Referral called to [facility] liaison- Cheri for IP Hospice review.  Cheri to follow up with pt/family and have pt reviewed for Hospice eligibility.  ICM to follow.   1640- Received call from [facility] liaison- Hospice MD has reviewed and pt does meet eligibility- they have a bed available and can admit tomorrow.   Pt will need GOLD DNR for transport.  ICM will follow up in the am to coordinate transport.    Expected Discharge Plan: Hospice Medical Facility Barriers to Discharge: Barriers Unresolved (comment) (IP Hospice referral pending review)               Expected Discharge Plan and Services   Discharge Planning Services: CM Consult Post Acute Care Choice: Hospice Living arrangements for the past 2 months: Single Family Home                 DME Arranged: N/A DME Agency: NA       HH Arranged: RN, IV Antibiotics HH Agency: [employer] Date HH Agency Contacted: 08/23/24   Representative spoke with at [facility] Agency: Pam/Kelly   Social Drivers of Health (SDOH) Interventions SDOH Screenings   Food Insecurity: No Food Insecurity (08/16/2024)  Housing: Low Risk (08/16/2024)  Transportation Needs: No Transportation Needs (08/16/2024)  Recent Concern: Transportation Needs - Unmet Transportation Needs (08/09/2024)  Utilities: Not At Risk (08/16/2024)  Recent Concern: Utilities - At Risk (07/07/2024)   Received from [facility]  Alcohol  Screen: Low Risk  (08/10/2023)  Depression (PHQ2-9): Low Risk (08/14/2024)  Financial Resource Strain: Low Risk (07/07/2024)   Received from [facility]  Physical Activity: Sufficiently Active (08/10/2023)  Recent Concern: Physical Activity - Insufficiently Active (07/12/2023)  Social Connections: Moderately Integrated (08/16/2024)  Stress: No Stress Concern Present (06/22/2024)   Received from [facility]  Tobacco Use: Medium Risk (08/16/2024)  Health Literacy: Adequate Health Literacy (08/10/2023)    Readmission Risk Interventions    08/04/2024    1:33 PM 07/14/2024   11:11 AM  Readmission Risk Prevention Plan  Transportation Screening Complete Complete  PCP or Specialist Appt within 3-5 Days Complete Complete  HRI or Home Care Consult Complete Complete  Palliative Care Screening Not Applicable Not Applicable  Medication Review (RN Care Manager) Complete Complete

## 2024-08-29 NOTE — Progress Notes (Signed)
 "  Rounding Note   Patient Name: Tanya Hogan Date of Encounter: 08/29/2024  [facility] Cardiologist: Redell Shallow, MD   Subjective No CP or dyspnea  Scheduled Meds:  amiodarone   200 mg Oral Daily   apixaban   10 mg Oral BID   Followed by   NOREEN ON 08/31/2024] apixaban   5 mg Oral BID   arformoterol   15 mcg Nebulization BID   aspirin   81 mg Oral Daily   budesonide  (PULMICORT ) nebulizer solution  0.25 mg Nebulization BID   Chlorhexidine  Gluconate Cloth  6 each Topical Daily   insulin  aspart  0-5 Units Subcutaneous QHS   insulin  aspart  0-9 Units Subcutaneous TID WC   lactobacillus  1 g Oral TID WC   levothyroxine   112 mcg Oral Daily   multivitamin with minerals  1 tablet Oral Daily   nystatin   5 mL Oral QID   mouth rinse  15 mL Mouth Rinse 4 times per day   pantoprazole   40 mg Oral QAC breakfast   pravastatin   80 mg Oral QHS   quiNIDine sulfate   200 mg Oral TID   revefenacin   175 mcg Nebulization Daily   sodium chloride  flush  3 mL Intravenous Q12H   sodium chloride  flush  3 mL Intravenous Q12H   spironolactone   12.5 mg Oral Daily   torsemide   40 mg Oral Daily   Continuous Infusions:  ceFTAROline  (TEFLARO ) IV 600 mg (08/29/24 0400)   DAPTOmycin  500 mg (08/28/24 1319)   PRN Meds: acetaminophen  **OR** acetaminophen , guaiFENesin -dextromethorphan , HYDROcodone -acetaminophen , levalbuterol , menthol , ondansetron  **OR** ondansetron  (ZOFRAN ) IV, mouth rinse, senna-docusate, traZODone    Vital Signs  Vitals:   08/28/24 2118 08/28/24 2329 08/29/24 0620 08/29/24 0750  BP:  116/62  (!) 103/56  Pulse:   89 89  Resp:  20 (!) 29 20  Temp:  97.9 F (36.6 C)  97.9 F (36.6 C)  TempSrc:  Oral  Oral  SpO2: 96% 97% 95% 99%  Weight:   82.3 kg   Height:        Intake/Output Summary (Last 24 hours) at 08/29/2024 0818 Last data filed at 08/28/2024 2000 Gross per 24 hour  Intake 390 ml  Output --  Net 390 ml      08/29/2024    6:20 AM 08/28/2024    5:00 AM 08/27/2024     4:18 AM  Last 3 Weights  Weight (lbs) 181 lb 7 oz 178 lb 1.6 oz 179 lb  Weight (kg) 82.3 kg 80.786 kg 81.194 kg      Telemetry AV paced - Personally Reviewed   Physical Exam  GEN: NAD, WD, chronically ill appearing Neck: supple Cardiac: RRR Respiratory: Diminished breath sounds bases; no wheeze GI: Soft, NT/ND MS: Trace lower extremity edema; rash bilaterally; 1+ edema left upper extremity Neuro:  No focal findings Psych: Normal affect   Labs High Sensitivity Troponin:   Recent Labs  Lab 08/02/24 1942 08/02/24 2142  TROPONINIHS 19* 21*    Recent Labs  Lab 08/16/24 0707  TRNPT 33*       Chemistry Recent Labs  Lab 08/26/24 1120 08/27/24 0737 08/28/24 0402 08/29/24 0349  NA 129* 130* 132* 133*  K 5.1 4.5 4.3 4.1  CL 97* 97* 99 98  CO2 24 22 26 26   GLUCOSE 128* 174* 95 95  BUN 13 18 16 16   CREATININE 0.73 0.92 0.82 0.85  CALCIUM  9.6 9.1 8.7* 8.3*  MG 2.1  --  2.0 2.0  PROT 6.2*  --  5.5* 5.7*  ALBUMIN 2.9*  --  2.6* 2.7*  AST 88*  --  64* 60*  ALT 48*  --  36 34  ALKPHOS 209*  --  166* 153*  BILITOT 1.1  --  0.8 0.9  GFRNONAA >60 >60 >60 >60  ANIONGAP 9 11 7 8      Hematology Recent Labs  Lab 08/27/24 0737 08/28/24 0402 08/29/24 0349  WBC 12.8* 10.3 9.5  RBC 3.78* 3.56* 3.64*  HGB 11.7* 11.0* 11.2*  HCT 35.7* 33.1* 33.9*  MCV 94.4 93.0 93.1  MCH 31.0 30.9 30.8  MCHC 32.8 33.2 33.0  RDW 15.7* 15.5 15.8*  PLT 233 260 262    BNP Recent Labs  Lab 08/27/24 0737  PROBNP 5,797.0*      Radiology  CT ABDOMEN PELVIS W CONTRAST Result Date: 08/29/2024 EXAM: CT ABDOMEN AND PELVIS WITH CONTRAST 08/29/2024 01:11:57 AM TECHNIQUE: CT of the abdomen and pelvis was performed with the administration of intravenous contrast. 75 mL (iohexol  (OMNIPAQUE ) 350 MG/ML injection 75 mL IOHEXOL  350 MG/ML SOLN) was administered. Multiplanar reformatted images are provided for review. Automated exposure control, iterative reconstruction, and/or weight-based adjustment  of the mA/kV was utilized to reduce the radiation dose to as low as reasonably achievable. COMPARISON: CT with iv contrast 11/29/2023 and 07/13/2024. CLINICAL HISTORY: Persistent bacteremia. FINDINGS: LOWER CHEST: Small symmetric layering pleural effusions, both increased. Posterior basilar opacities which could be due to atelectasis or aspiration with additional hazy lower lobe opacities which could be from low lung volumes or pneumonitis versus edema. The more anterior lung bases are clear. There is severe cardiomegaly. Small but circumferential pericardial effusion. There is pacemaker and AICD wiring in the heart. LIVER: Capsular nodularity of the left lobe of the liver is again noted suggesting at least early cirrhosis. The liver is 20 cm in length with no mass enhancement. GALLBLADDER AND BILE DUCTS: There is a subcentimeter stone in the distal gallbladder, surrounded by dense sludge. No gallbladder wall thickening or biliary dilatation. SPLEEN: No acute abnormality. PANCREAS: The pancreas is partially atrophic without mass. ADRENAL GLANDS: No acute abnormality. KIDNEYS, URETERS AND BLADDER: No stones in the kidneys or ureters. No hydronephrosis. No perinephric or periureteral stranding. There is no renal mass. There is a 2.1 cm parapelvic Bosniak 1 cyst in the inferior pole of the left kidney, Hounsfield density is 19. No follow-up imaging recommended. Urinary bladder is unremarkable. GI AND BOWEL: Stomach demonstrates no acute abnormality. The appendix is normal. There is mild retained stool in the ascending and transverse colon. Left colonic diverticulosis greatest in sigmoid colon. No evidence of acute diverticulitis. There is interval new thickening in the rectum with perirectal stranding, most likely due to proctitis given the short interval appearance of this. Infiltrating disease would be less likely. There is no bowel obstruction. PERITONEUM AND RETROPERITONEUM: There is trace ascites in the presacral  pelvis. No free air. VASCULATURE: Aorta is normal in caliber. Aortic atherosclerosis. LYMPH NODES: No lymphadenopathy. REPRODUCTIVE ORGANS: No acute abnormality. BONES AND SOFT TISSUES: Osteopenia and degenerative change Thoracic and lumbar spine. Continued body wall edema in the flanks and lateral hips. Post injection changes in the abdominal wall subcutaneous fat. There are small inguinal fat hernias. No acute osseous abnormality. No focal soft tissue abnormality. IMPRESSION: 1. Interval new thickening in the rectum with perirectal stranding, most likely due to proctitis given the short interval appearance. Infiltrating disease is less likely. 2. Posterior basilar opacities and additional hazy lower lobe opacities, which could be due to atelectasis  or aspiration, with pneumonitis versus edema also considered. 3. Small symmetric layering pleural effusions, increased compared to prior study. 4. Severe cardiomegaly with small circumferential pericardial effusion. 5. Trace ascites in the presacral pelvis. 6. Early cirrhotic changes in the liver. No splenomegaly. 7. Cholelithiasis. 8. Constipation and diverticulosis. Electronically signed by: Francis Quam MD 08/29/2024 07:49 AM EST RP Workstation: [id number]   DG Chest 2 View Result Date: 08/27/2024 CLINICAL DATA:  Congestive heart failure EXAM: CHEST - 2 VIEW COMPARISON:  August 17, 2024 FINDINGS: Stable cardiomegaly. Left-sided defibrillator is unchanged. Status post right shoulder arthroplasty. Mild central pulmonary vascular congestion is noted. Possible minimal bibasilar pulmonary edema is noted with small pleural effusions. IMPRESSION: Mild central pulmonary vascular congestion with possible minimal bibasilar pulmonary edema and small pleural effusions. Electronically Signed   By: Lynwood Landy Raddle M.D.   On: 08/27/2024 11:52    Patient Profile   74 y.o. female with a NICM/CHB, HFrEF likely in part 2/2 pacing-induced CM s/p LEFT MDT CRTP (RA/RV/CS-AIV  lead) with subsequent addition of RV-LBaP lead (11/03/21) with subsequent addition of RV-d lead (05/31/24) for ATP for refractory VT, DM2, NASH cirrhosis who presented with lethargy and respiratory distress and is now found to have 4/4 cultures with MRSA.  Echocardiogram this admission shows ejection fraction less than 20%, severe left ventricular enlargement, grade 3 diastolic dysfunction, moderate right ventricular enlargement, moderate RV dysfunction, severe biatrial enlargement, small pericardial effusion.  Assessment & Plan   1 bacteremia-MRSA bacteremia noted.  Patient has been seen by electrophysiology and felt not to be a good candidate for transesophageal echocardiogram or device extraction.  Continue antibiotics per infectious disease recommendations.  2 nonischemic cardiomyopathy/acute on chronic systolic congestive heart failure-volume status appears to be reasonable.  Will continue Demadex  and spironolactone  at present dose.  She apparently has had an allergy to ARBs and Entresto.  Will consider low-dose carvedilol  at discharge.  3 history of ventricular tachycardia-continue amiodarone  and quinidine.  No significant arrhythmias over the last 24 hours on telemetry.  4 status post CRT-P-as above felt not to be a candidate for device extraction.  5 no CODE BLUE   6 upper extremity DVT-apixaban  has been initiated.  For questions or updates, please contact [facility] Please consult [email or web address] for contact info under  Signed, Redell Shallow, MD  08/29/2024, 8:18 AM    "

## 2024-08-29 NOTE — Progress Notes (Signed)
" ° °  This pt was referred to hospice services for the Hospice facility in [facility]. I have reviewed chart and spoke to the pt daughter to set up best time to meet. She was not at bedside and would like to be present when I discuss with the pt hospice care. She reports they are just all in shock at how fast this has happen and can not believe that is has happen now. We plan to meet tomorrow am to discuss with the pt and the daughter on going hospice care and then will present to my MD for approval.   Magdalena Berber RN [phone]  "

## 2024-08-29 NOTE — Progress Notes (Signed)
 "                                                                                                                                                                                                       Daily Progress Note   Patient Name: Tanya Hogan       Date: 08/29/2024 DOB: Sep 23, 1950  Age: 74 y.o. MRN#: [id number] Attending Physician: Trixie Nilda HERO, MD Primary Care Physician: [name], MD Admit Date: 08/16/2024  Reason for Consultation/Follow-up: Establishing goals of care  Subjective: AM: Medical records reviewed including progress notes, labs, imaging.  Called patient's daughter [name] to coordinate for a family meeting based on patient's expressed interest yesterday.  She confirms her understanding is that patient is more ready for hospice.  She tells me she did speak with Dr. Almetta again for quite a while yesterday.  She anticipates she will be available in the early afternoon and will call her brother to ensure he can get there as well.  She did call back to confirm availability for 12 PM family meeting.  PM: Patient assessed at the bedside.  She is sitting in bedside chair, no major complaints.  Her son and daughter arrived for the family meeting and I created space and opportunity for patient and family's thoughts and feelings on patient's current illness. She expressed how tired she is.  She is ready for hospice and both son and daughter are supportive.  I reviewed the hospice referral process with them.  They understand that she may not be eligible for hospice facility yet.  I anticipate a prognosis of weeks.  They would not be able to take care of her at home with hospice being limited.  Reviewed option of comfort focused care in the hospital while determining best setting for patient's hospice care after discharge.  Educated that patient would no longer receive aggressive medical interventions such as continuous vital signs, lab work, radiology testing, or  medications not focused on comfort. All care would focus on how the patient is looking and feeling. This would include management of any symptoms that may cause discomfort, pain, shortness of breath, cough, nausea, agitation, anxiety, and/or secretions etc. Symptoms would be managed with medications and other non-pharmacological interventions such as spiritual support if requested, repositioning, music therapy, or therapeutic listening. Family verbalized understanding and appreciation.  Patient is in agreement with transition to full comfort focused care today.  She desires to be slow and careful with narcotics given her past experiences.  Emotional support and therapeutic listening was provided as she reflected on the  things she would like to do before dying, as well as the family she would like to see.  Questions and concerns addressed. PMT will continue to support holistically.   Length of Stay: 13   Physical Exam Vitals and nursing note reviewed.  Constitutional:      General: She is not in acute distress.    Appearance: She is ill-appearing.  HENT:     Head: Normocephalic and atraumatic.  Cardiovascular:     Rate and Rhythm: Normal rate.  Pulmonary:     Effort: Pulmonary effort is normal.  Musculoskeletal:     Left hand: Swelling present.  Skin:    General: Skin is dry.     Findings: Petechiae present.     Comments: Diffuse rash over bilateral feet  Neurological:     Mental Status: She is alert and oriented to person, place, and time. Mental status is at baseline.  Psychiatric:        Mood and Affect: Affect is labile.        Behavior: Behavior normal.        Cognition and Memory: Cognition normal.            Vital Signs: BP (!) 103/56 (BP Location: Right Arm)   Pulse 89   Temp 97.9 F (36.6 C) (Oral)   Resp 20   Ht 5' 1 (1.549 m)   Wt 82.3 kg   SpO2 99%   BMI 34.28 kg/m  SpO2: SpO2: 99 % O2 Device: O2 Device: Nasal Cannula O2 Flow Rate: O2 Flow Rate (L/min): 2  L/min  Palliative Care Assessment & Plan   Patient Profile: 74 y.o. female  with past medical history of recent revision of pacemaker/AICD, breast cancer, end stage chronic heart failure, nonalcoholic cirrhosis, BiV pacemake, depression, DM2, GERD, HTN, hypothyroidism, valvular heart disease, pulmonary hypertension, sleep apnea and ventricular tachy arrhythmia  admitted on 08/16/2024 with swelling and difficulty breathing.    Admitted for acute on chronic HF. On 12/25 she became minimally responsive requiring BiPAP. She was found to have MRSA bacteremia.   She has six inpatient hospitalizations in the last six months.   Recommendations/Plan: Continue DNR/DNI Transition to comfort focused care today Morphine  PRN for pain/air hunger/comfort Robinul  PRN for excessive secretions Ativan  PRN for agitation/anxiety Zofran  PRN for nausea Liquifilm tears PRN for dry eyes May have comfort feeding Comfort cart for family Unrestricted visitations in the setting of EOL (per policy) Oxygen  PRN 2L or less for comfort. No escalation. TOC consulted for assistance with referral to hospice facility, [facility] of the [place] is patient and family's preference Psychosocial and emotional support provided PMT will continue to follow and support   Care plan was discussed with patient, bedside RN, primary MD, patient's son and daughter   Mickle Fell, PA-C Palliative Medicine Team Team phone # [phone]  Thank you for allowing the Palliative Medicine Team to assist in the care of this patient. Please utilize secure chat with additional questions, if there is no response within 30 minutes please call the above phone number.  Palliative Medicine Team providers are available by phone from 7am to 7pm daily and can be reached through the team cell phone.  Should this patient require assistance outside of these hours, please call the patient's attending physician.   Billing based on MDM:  High  Problems Addressed: One acute or chronic illness or injury that poses a threat to life or bodily function  Amount and/or Complexity of Data: Category 1:Review of prior external  note(s) from each unique source, Review of the result(s) of each unique test, and Assessment requiring an independent historian(s), Category 2:Independent interpretation of a test performed by another physician/other qualified health care professional (not separately reported), and Category 3:Discussion of management or test interpretation with external physician/other qualified health care professional/appropriate source (not separately reported)  Risks: Parenteral controlled substances and Decision not to resuscitate or to de-escalate care because of poor prognosis  "

## 2024-08-29 NOTE — Plan of Care (Signed)
  Problem: Nutrition: Goal: Adequate nutrition will be maintained Outcome: Progressing   Problem: Coping: Goal: Level of anxiety will decrease Outcome: Progressing   Problem: Pain Managment: Goal: General experience of comfort will improve and/or be controlled Outcome: Progressing   Problem: Safety: Goal: Ability to remain free from injury will improve Outcome: Progressing

## 2024-08-29 NOTE — Progress Notes (Addendum)
 This chaplain is present for F/U spiritual care after the Pt. family meeting. The chaplain entered as the Pt. children, Tanya Hogan and Tanya Hogan exited. The chaplain listened reflectively as the Pt. shared her decisions from the meeting. The chaplain understands the Pt. is planning for inpatient hospice in [facility].  The chaplain reviewed the Pt. Advance Directive:  HCPOA and Living Will with the Pt. The Pt. answered the chaplain's clarifying questions and is ready for a notary visit. Notaries are not available at this time. The Pt. incomplete document was placed in her seek and find puzzle book.  **1424 This chaplain is present with the Pt., notary, and witnesses for the notarizing of the Pt. AD:  HCPOA and LW.    The Pt. is naming Tanya Hogan as her healthcare agnet. If this person is unable or unwilling to serve in this role the Pt. next choice is Tanya Hogan Finder.  The chaplain gave the Pt. the original AD along with two copies. The chaplain scanned the Pt. AD into the Pt. EMR.  This chaplain is available for F/U spiritual care as needed.  Chaplain Leeroy Hummer [phone]

## 2024-08-29 NOTE — Progress Notes (Signed)
 PT Cancellation Note  Patient Details Name: Tanya Hogan MRN: [id number] DOB: 1951-03-20   Cancelled Treatment:    Reason Eval/Treat Not Completed: (P) Medical issues which prohibited therapy, per chart review and RN pt transitioning to comfort care and with increased fatigue. Will hold therapies at this time.   Therisa SAUNDERS. PTA Acute Rehabilitation Services Office: [phone]    Therisa CHRISTELLA Boor 08/29/2024, 3:05 PM

## 2024-08-30 ENCOUNTER — Telehealth (HOSPITAL_COMMUNITY): Payer: Self-pay

## 2024-08-30 DIAGNOSIS — I509 Heart failure, unspecified: Secondary | ICD-10-CM | POA: Diagnosis not present

## 2024-08-30 DIAGNOSIS — Z515 Encounter for palliative care: Secondary | ICD-10-CM | POA: Diagnosis not present

## 2024-08-30 DIAGNOSIS — R7881 Bacteremia: Secondary | ICD-10-CM | POA: Diagnosis not present

## 2024-08-30 DIAGNOSIS — B9562 Methicillin resistant Staphylococcus aureus infection as the cause of diseases classified elsewhere: Secondary | ICD-10-CM | POA: Diagnosis not present

## 2024-08-30 LAB — CULTURE, BLOOD (ROUTINE X 2)
Culture: NO GROWTH
Special Requests: ADEQUATE

## 2024-08-30 LAB — HEPATITIS B CORE ANTIBODY, TOTAL: HEP B CORE AB: NEGATIVE

## 2024-08-30 MED ORDER — GLYCOPYRROLATE 1 MG PO TABS: 1.0000 mg | ORAL_TABLET | ORAL | Status: DC | PRN

## 2024-08-30 MED ORDER — HYDROCODONE-ACETAMINOPHEN 5-325 MG PO TABS: 1.0000 | ORAL_TABLET | ORAL | Status: DC | PRN

## 2024-08-30 MED ORDER — POLYVINYL ALCOHOL 1.4 % OP SOLN: 1.0000 [drp] | Freq: Four times a day (QID) | OPHTHALMIC | Status: DC | PRN

## 2024-08-30 MED ORDER — ONDANSETRON HCL 4 MG PO TABS: 4.0000 mg | ORAL_TABLET | Freq: Four times a day (QID) | ORAL | Status: DC | PRN

## 2024-08-30 MED ORDER — BUDESONIDE 0.25 MG/2ML IN SUSP: 0.2500 mg | Freq: Two times a day (BID) | RESPIRATORY_TRACT | Status: DC

## 2024-08-30 MED ORDER — NYSTATIN 100000 UNIT/ML MT SUSP: 5.0000 mL | Freq: Four times a day (QID) | OROMUCOSAL | Status: DC

## 2024-08-30 MED ORDER — ARFORMOTEROL TARTRATE 15 MCG/2ML IN NEBU: 15.0000 ug | INHALATION_SOLUTION | Freq: Two times a day (BID) | RESPIRATORY_TRACT | Status: DC

## 2024-08-30 MED ORDER — ACETAMINOPHEN 325 MG PO TABS: 650.0000 mg | ORAL_TABLET | Freq: Four times a day (QID) | ORAL | Status: DC | PRN

## 2024-08-30 MED ORDER — LORAZEPAM 1 MG PO TABS: 1.0000 mg | ORAL_TABLET | ORAL | Status: DC | PRN

## 2024-08-30 MED ORDER — TRAZODONE HCL 50 MG PO TABS: 25.0000 mg | ORAL_TABLET | Freq: Every evening | ORAL | Status: DC | PRN

## 2024-08-30 MED ORDER — BIOTENE DRY MOUTH MT LIQD: 15.0000 mL | OROMUCOSAL | Status: DC | PRN

## 2024-08-30 MED ORDER — GUAIFENESIN-DM 100-10 MG/5ML PO SYRP: 5.0000 mL | ORAL_SOLUTION | ORAL | Status: DC | PRN

## 2024-08-30 MED ORDER — SENNOSIDES-DOCUSATE SODIUM 8.6-50 MG PO TABS: 1.0000 | ORAL_TABLET | Freq: Every evening | ORAL | Status: DC | PRN

## 2024-08-30 NOTE — Telephone Encounter (Signed)
 Called to confirm/remind patient of [facility] appointment [facility] on [facility].   Appointment:   [] Confirmed  [x] Left mess   [] No answer/No voice mail  [] VM Full/unable to leave message  [] Phone not in service  And to bring in all medications and/or complete list.

## 2024-08-30 NOTE — Progress Notes (Signed)
 "  Rounding Note   Patient Name: Tanya Hogan Date of Encounter: 08/30/2024  [facility] Cardiologist: Redell Shallow, MD   Subjective Pt denies CP or dyspnea  Scheduled Meds:  arformoterol   15 mcg Nebulization BID   budesonide  (PULMICORT ) nebulizer solution  0.25 mg Nebulization BID   Chlorhexidine  Gluconate Cloth  6 each Topical Daily   levothyroxine   112 mcg Oral Daily   nystatin   5 mL Oral QID   mouth rinse  15 mL Mouth Rinse 4 times per day   pantoprazole   40 mg Oral QAC breakfast   revefenacin   175 mcg Nebulization Daily   sodium chloride  flush  3 mL Intravenous Q12H   sodium chloride  flush  3 mL Intravenous Q12H   spironolactone   12.5 mg Oral Daily   torsemide   40 mg Oral Daily   Continuous Infusions:   PRN Meds: acetaminophen  **OR** acetaminophen , antiseptic oral rinse, artificial tears, diphenhydrAMINE , glycopyrrolate  **OR** glycopyrrolate  **OR** glycopyrrolate , guaiFENesin -dextromethorphan , HYDROcodone -acetaminophen , levalbuterol , LORazepam  **OR** [DISCONTINUED] LORazepam  **OR** LORazepam , menthol , morphine  injection, ondansetron  **OR** ondansetron  (ZOFRAN ) IV, mouth rinse, senna-docusate, traZODone    Vital Signs  Vitals:   08/29/24 1929 08/29/24 2038 08/29/24 2106 08/29/24 2121  BP: (!) 99/58 108/60    Pulse: 88 89    Resp: 20 20 20 16   Temp: (!) 97.3 F (36.3 C) 97.9 F (36.6 C)    TempSrc: Oral Oral    SpO2: 97% 93%    Weight:      Height:        Intake/Output Summary (Last 24 hours) at 08/30/2024 0806 Last data filed at 08/29/2024 2000 Gross per 24 hour  Intake 480 ml  Output --  Net 480 ml      08/29/2024    6:20 AM 08/28/2024    5:00 AM 08/27/2024    4:18 AM  Last 3 Weights  Weight (lbs) 181 lb 7 oz 178 lb 1.6 oz 179 lb  Weight (kg) 82.3 kg 80.786 kg 81.194 kg      Telemetry AV paced - Personally Reviewed   Physical Exam  GEN: NAD Neck: supple, no adenopathy Cardiac: RRR, no rub Respiratory: CTA GI: Soft, NT/ND, no masses MS:  Trace lower extremity edema; rash bilaterally; 1+ edema left upper extremity Neuro:  Grossly intact Psych: Normal affect   Labs High Sensitivity Troponin:   Recent Labs  Lab 08/02/24 1942 08/02/24 2142  TROPONINIHS 19* 21*    Recent Labs  Lab 08/16/24 0707  TRNPT 33*       Chemistry Recent Labs  Lab 08/26/24 1120 08/27/24 0737 08/28/24 0402 08/29/24 0349  NA 129* 130* 132* 133*  K 5.1 4.5 4.3 4.1  CL 97* 97* 99 98  CO2 24 22 26 26   GLUCOSE 128* 174* 95 95  BUN 13 18 16 16   CREATININE 0.73 0.92 0.82 0.85  CALCIUM  9.6 9.1 8.7* 8.3*  MG 2.1  --  2.0 2.0  PROT 6.2*  --  5.5* 5.7*  ALBUMIN 2.9*  --  2.6* 2.7*  AST 88*  --  64* 60*  ALT 48*  --  36 34  ALKPHOS 209*  --  166* 153*  BILITOT 1.1  --  0.8 0.9  GFRNONAA >60 >60 >60 >60  ANIONGAP 9 11 7 8      Hematology Recent Labs  Lab 08/27/24 0737 08/28/24 0402 08/29/24 0349  WBC 12.8* 10.3 9.5  RBC 3.78* 3.56* 3.64*  HGB 11.7* 11.0* 11.2*  HCT 35.7* 33.1* 33.9*  MCV 94.4 93.0 93.1  MCH 31.0 30.9 30.8  MCHC 32.8 33.2 33.0  RDW 15.7* 15.5 15.8*  PLT 233 260 262    BNP Recent Labs  Lab 08/27/24 0737  PROBNP 5,797.0*      Radiology  CT ABDOMEN PELVIS W CONTRAST Result Date: 08/29/2024 EXAM: CT ABDOMEN AND PELVIS WITH CONTRAST 08/29/2024 01:11:57 AM TECHNIQUE: CT of the abdomen and pelvis was performed with the administration of intravenous contrast. 75 mL (iohexol  (OMNIPAQUE ) 350 MG/ML injection 75 mL IOHEXOL  350 MG/ML SOLN) was administered. Multiplanar reformatted images are provided for review. Automated exposure control, iterative reconstruction, and/or weight-based adjustment of the mA/kV was utilized to reduce the radiation dose to as low as reasonably achievable. COMPARISON: CT with iv contrast 11/29/2023 and 07/13/2024. CLINICAL HISTORY: Persistent bacteremia. FINDINGS: LOWER CHEST: Small symmetric layering pleural effusions, both increased. Posterior basilar opacities which could be due to atelectasis  or aspiration with additional hazy lower lobe opacities which could be from low lung volumes or pneumonitis versus edema. The more anterior lung bases are clear. There is severe cardiomegaly. Small but circumferential pericardial effusion. There is pacemaker and AICD wiring in the heart. LIVER: Capsular nodularity of the left lobe of the liver is again noted suggesting at least early cirrhosis. The liver is 20 cm in length with no mass enhancement. GALLBLADDER AND BILE DUCTS: There is a subcentimeter stone in the distal gallbladder, surrounded by dense sludge. No gallbladder wall thickening or biliary dilatation. SPLEEN: No acute abnormality. PANCREAS: The pancreas is partially atrophic without mass. ADRENAL GLANDS: No acute abnormality. KIDNEYS, URETERS AND BLADDER: No stones in the kidneys or ureters. No hydronephrosis. No perinephric or periureteral stranding. There is no renal mass. There is a 2.1 cm parapelvic Bosniak 1 cyst in the inferior pole of the left kidney, Hounsfield density is 19. No follow-up imaging recommended. Urinary bladder is unremarkable. GI AND BOWEL: Stomach demonstrates no acute abnormality. The appendix is normal. There is mild retained stool in the ascending and transverse colon. Left colonic diverticulosis greatest in sigmoid colon. No evidence of acute diverticulitis. There is interval new thickening in the rectum with perirectal stranding, most likely due to proctitis given the short interval appearance of this. Infiltrating disease would be less likely. There is no bowel obstruction. PERITONEUM AND RETROPERITONEUM: There is trace ascites in the presacral pelvis. No free air. VASCULATURE: Aorta is normal in caliber. Aortic atherosclerosis. LYMPH NODES: No lymphadenopathy. REPRODUCTIVE ORGANS: No acute abnormality. BONES AND SOFT TISSUES: Osteopenia and degenerative change Thoracic and lumbar spine. Continued body wall edema in the flanks and lateral hips. Post injection changes in the  abdominal wall subcutaneous fat. There are small inguinal fat hernias. No acute osseous abnormality. No focal soft tissue abnormality. IMPRESSION: 1. Interval new thickening in the rectum with perirectal stranding, most likely due to proctitis given the short interval appearance. Infiltrating disease is less likely. 2. Posterior basilar opacities and additional hazy lower lobe opacities, which could be due to atelectasis or aspiration, with pneumonitis versus edema also considered. 3. Small symmetric layering pleural effusions, increased compared to prior study. 4. Severe cardiomegaly with small circumferential pericardial effusion. 5. Trace ascites in the presacral pelvis. 6. Early cirrhotic changes in the liver. No splenomegaly. 7. Cholelithiasis. 8. Constipation and diverticulosis. Electronically signed by: Francis Quam MD 08/29/2024 07:49 AM EST RP Workstation: [id number]    Patient Profile   74 y.o. female with a NICM/CHB, HFrEF likely in part 2/2 pacing-induced CM s/p LEFT MDT CRTP (RA/RV/CS-AIV lead) with subsequent addition of RV-LBaP lead (11/03/21)  with subsequent addition of RV-d lead (05/31/24) for ATP for refractory VT, DM2, NASH cirrhosis who presented with lethargy and respiratory distress and is now found to have 4/4 cultures with MRSA.  Echocardiogram this admission shows ejection fraction less than 20%, severe left ventricular enlargement, grade 3 diastolic dysfunction, moderate right ventricular enlargement, moderate RV dysfunction, severe biatrial enlargement, small pericardial effusion.  Assessment & Plan   1 bacteremia-MRSA bacteremia noted.  Patient has been seen by electrophysiology and felt not to be a good candidate for transesophageal echocardiogram or device extraction.  Patient has decided to proceed with hospice care.    2 nonischemic cardiomyopathy/acute on chronic systolic congestive heart failure-volume status appears to be reasonable.  Can continue Demadex  and  spironolactone  for now.  3 history of ventricular tachycardia-given hospice care amiodarone  and quinidine discontinued.  4 status post CRT-P-as above felt not to be a candidate for device extraction.  5 no CODE BLUE   6 upper extremity DVT-apixaban  discontinued.  Cardiology will sign off.  Please call with questions.  For questions or updates, please contact [facility] Please consult [email or web address] for contact info under  Signed, Redell Shallow, MD  08/30/2024, 8:06 AM    "

## 2024-08-30 NOTE — Telephone Encounter (Signed)
Forms completed and placed in Tonya B basket 

## 2024-08-30 NOTE — Progress Notes (Signed)
 "                                                                                                                                                                                                       Daily Progress Note   Patient Name: Tanya Hogan       Date: 08/30/2024 DOB: 1951-04-18  Age: 74 y.o. MRN#: [id number] Attending Physician: Cindy Garnette POUR, MD Primary Care Physician: [name], MD Admit Date: 08/16/2024  Reason for Consultation/Follow-up: Establishing goals of care  Subjective: Patient is unsure what medications she has tried from her options for comfort. She feels that she is getting there and making progress on her overall comfort level. Her daughter, sister, and granddaughter are present visiting.   Created space and opportunity for patient and family's thoughts and feelings on patient's current illness. Emotional support and therapeutic listening was provided as family shared their previous experience navigating end of life care with family members. Daughter is concerned about how to navigate whether or not darra is going to be visiting from out of town. Provided counseling to the best of my ability, recognizing the difficulty of being unable to meet her dying wish. [facility] hospice liaison arrived at the end of my visit and confirmed patient has been accepted at the hospice facility today with expectation of rapid decline.   Questions and concerns addressed.  Objective: Reviewed MAR. Patient has received 2 doses of PRN Ativan  in the past 24 hours. No other comfort medications utilized. Discussed with RN, who has no concerns about effectiveness of comfort medications when needed. Discussed prognostication with hospice liaison.  Physical Exam Vitals and nursing note reviewed.  Constitutional:      General: She is not in acute distress.    Appearance: She is ill-appearing.  HENT:     Head: Normocephalic and atraumatic.  Cardiovascular:     Rate and Rhythm:  Normal rate.  Pulmonary:     Effort: Pulmonary effort is normal.  Skin:    General: Skin is dry.     Findings: Petechiae present.     Comments: Diffuse rash over bilateral feet and arms  Neurological:     Mental Status: She is alert and oriented to person, place, and time. Mental status is at baseline.  Psychiatric:        Mood and Affect: Mood normal.        Behavior: Behavior normal.            Vital Signs: BP 90/73 (BP Location: Left Arm)   Pulse 88   Temp  97.6 F (36.4 C) (Oral)   Resp 17   Ht 5' 1 (1.549 m)   Wt 82.3 kg   SpO2 92%   BMI 34.28 kg/m  SpO2: SpO2: 92 % O2 Device: O2 Device: Room Air O2 Flow Rate: O2 Flow Rate (L/min): 2 L/min  Palliative Care Assessment & Plan   Patient Profile: 74 y.o. female  with past medical history of recent revision of pacemaker/AICD, breast cancer, end stage chronic heart failure, nonalcoholic cirrhosis, BiV pacemake, depression, DM2, GERD, HTN, hypothyroidism, valvular heart disease, pulmonary hypertension, sleep apnea and ventricular tachy arrhythmia  admitted on 08/16/2024 with swelling and difficulty breathing.    Admitted for acute on chronic HF. On 12/25 she became minimally responsive requiring BiPAP. She was found to have MRSA bacteremia.   She has six inpatient hospitalizations in the last six months.   Assessment: End of life care  Recommendations/Plan: Continue DNR/DNI Continue comfort focused care, no adjustments required today Patient is appropriate for hospice facility placement later today Psychosocial and emotional support provided PMT will continue to follow and support   Care plan was discussed with patient, bedside RN, patient's family, hospice liaison    Mickle Fell, PA-C Palliative Medicine Team Team phone # [phone]  Thank you for allowing the Palliative Medicine Team to assist in the care of this patient. Please utilize secure chat with additional questions, if there is no response within  30 minutes please call the above phone number.  Palliative Medicine Team providers are available by phone from 7am to 7pm daily and can be reached through the team cell phone.  Should this patient require assistance outside of these hours, please call the patient's attending physician.  I personally spent a total of 25 minutes in the care of the patient today including preparing to see the patient, performing a medically appropriate exam/evaluation, referring and communicating with other health care professionals, documenting clinical information in the EHR, and coordinating care.  "

## 2024-08-30 NOTE — Discharge Summary (Signed)
" Physician Discharge Summary   Patient: Tanya Hogan MRN: [id number] DOB: 08/06/1951  Admit date:     08/16/2024  Discharge date: 08/30/2024  Discharge Physician: Garnette Pelt   PCP: [name], MD   Recommendations at discharge:    Follow up with hospice services Follow up with PCP on as needed basis  Discharge Diagnoses: Principal Problem:   MRSA bacteremia Active Problems:   Acute on chronic combined systolic and diastolic CHF (congestive heart failure) (HCC)   Essential hypertension   Ventricular tachycardia, unspecified (HCC)   Type 2 diabetes mellitus with hyperlipidemia (HCC)   NASH (nonalcoholic steatohepatitis)   Gastroesophageal reflux disease   COPD (chronic obstructive pulmonary disease) (HCC)   Hypothyroidism   Irritable bowel syndrome with constipation   History of left breast cancer   Microcytic anemia   Hyponatremia   Obesity, Class I, BMI 30-34.9   NICM (nonischemic cardiomyopathy) (HCC)  Resolved Problems:   * No resolved hospital problems. *  Hospital Course: 74 year old female with history of chronic systolic CHF, HTN, PVD, DM2, presence of pacemaker, hypothyroidism comes into the hospital with shortness of breath. Of note, she was recently hospitalized mid-December for CHF exacerbation, sent home after diuresis, but developed recurrent dyspnea and lower extremity edema at home and came back to the ED. She was found to have MRSA bacteremia. ID consulted and following. Due to presence of pacemaker cardiology consulted as well. She is deemed to be a very poor candidate for pacer lead extraction given end-stage heart failure and poor prognosis. Palliative consulted as well   Assessment and Plan: Principal problem MRSA bacteremia -ID had been following, appreciate input.  Patient was admitted to the hospital and was found to have MRSA bacteremia.  She has a pacemaker in place.  ID, cardiology were consulted.  She was evaluated and not deemed a  surgical candidate for pacemaker removal - Attempts were made to treat the infection initially with IV antibiotics for 4 weeks followed by lifelong suppression given presence of pacemaker and inability to remove.  However, her hospital course has been complicated by inability to clear the infection and subsequent blood cultures, and repeat surveillance cultures on 12/25, 12/27, 12/29 and 1/2 are all positive for MRSA.  This overall confers a very poor prognosis.  ID later added ceftaroline  - Palliative consulted was consulted. Decision was made for more palliative care and transition to Hospice   Active problems Acute onset generalized weakness - on 08/24/2024.  A code stroke was called, she was evaluated at the bedside by the neurology team.  A CT of the head showed a small age-indeterminate lacunar infarct in the right thalamus.  Repeat CT scan at 24 hours without change.  Neurology does not think this was a stroke stroke.  She was unable to get an MRI due to the pacemaker   Skin rash -in the setting of MRSA bacteremia, possibly leukocytoclastic vasculitis.   Acute on chronic systolic CHF -most recent 2D echo showed LVEF less than 20%, global hypokinesis, severe LV dilatation, grade 3 diastolic dysfunction, RV systolic function moderate reduction, RVSP 48, biatrial severe dilatation and small pericardial effusion.  She was diuresed   Left upper extremity, left IJ vein thrombosis-was on Eliquis , later focus on comfort   CHB-with pacemaker.  Recently had subsequent addition of RV-the lead on October 2025 for ATP for refractory VT   History of VT-no arrhythmias.  Was continued with suppressive treatment with amiodarone  as well as quinidine Later transition to comfort  measures   Hypothyroidism -continued Synthroid    Essential hypertension-continued spironolactone    NASH cirrhosis-no signs of decompensation   Underlying COPD-continue bronchodilator therapy as needed   History of left breast  cancer-in remission   Microcytic anemia-no bleeding   Hyponatremia-overall improved   Obesity, class I-BMI 33.3.     Goals of care -See documentation by Palliative Care and Hospice. Decision was made to transition to comfort care with plan for inpt hospice  DNR   Consultants: Cardiology, Critical Care, ID, Palliative Care, Hospice Procedures performed:   Disposition: Hospice care Diet recommendation:  Regular diet DISCHARGE MEDICATION: Allergies as of 08/30/2024       Reactions   Injectafer [ferric Carboxymaltose] Shortness Of Breath   Mild SOB following injectafer infusion   Penicillins Anaphylaxis, Rash   Tolerated ceftin  2020   Accupril [quinapril Hcl] Other (See Comments)   Hyperkalemia=high potassium level   Celebrex [celecoxib] Other (See Comments)   Hx hematemesis, bleeding ulcer   Cozaar  [losartan  Potassium] Itching   Dml Forte Rash   Entresto [sacubitril-valsartan] Other (See Comments)   Hyperkalemia   Flonase  [fluticasone ] Other (See Comments)   Hx nasal ulcer   Lactose Intolerance (gi) Diarrhea   Lipitor [atorvastatin ] Other (See Comments)   Myalgias    Nasonex  [mometasone  Furoate] Other (See Comments)   Epistaxis    Nsaids Other (See Comments)   Hx hematemesis, bleeding ulcer   Advil [ibuprofen] Nausea And Vomiting   Hx hematemesis, bleeding ulcer   Baking Soda-fluoride [sodium Fluoride] Itching, Rash   Bayer Aspirin  [aspirin ] Other (See Comments)   Hx hematemesis, bleeding ulcer   Chamomile Hives   Clindamycin /lincomycin Rash   Codeine Nausea And Vomiting   Lasix  [furosemide ] Other (See Comments)   Tinnitus  Hypotension   Miralax  [polyethylene Glycol (macrogol)] Other (See Comments)   Leaky gut syndrome   Quinine Rash   Reclast  [zoledronic  Acid] Rash, Other (See Comments)   Tremors    Rifadin [rifampin] Other (See Comments)   Unknown reaction   Singulair  [montelukast ] Other (See Comments)   Dizziness    Sulfa Antibiotics Rash   Ultram   [tramadol ] Anxiety, Rash   Vesicare  [solifenacin ] Other (See Comments)   Urinary retention   Wellbutrin  [bupropion ] Other (See Comments)   Myalgias  Skin crawling sensation   Wool Alcohol  [lanolin] Rash        Medication List     STOP taking these medications    alendronate  70 MG tablet Commonly known as: FOSAMAX    amiodarone  200 MG tablet Commonly known as: PACERONE    aspirin  81 MG chewable tablet   Calcium  600+D Plus Minerals 600-400 MG-UNIT Tabs   EPINEPHrine  0.3 mg/0.3 mL Soaj injection Commonly known as: EPI-PEN   fexofenadine  180 MG tablet Commonly known as: ALLEGRA    metFORMIN  500 MG 24 hr tablet Commonly known as: GLUCOPHAGE -XR   Multivitamin Women 50+ Tabs   pravastatin  40 MG tablet Commonly known as: PRAVACHOL    quiNIDine sulfate  200 MG tablet   Spiriva  Respimat 2.5 MCG/ACT Aers Generic drug: Tiotropium Bromide    valACYclovir  1000 MG tablet Commonly known as: VALTREX    Wixela Inhub 250-50 MCG/ACT Aepb Generic drug: fluticasone -salmeterol       TAKE these medications    acetaminophen  325 MG tablet Commonly known as: TYLENOL  Take 2 tablets (650 mg total) by mouth every 6 (six) hours as needed for mild pain (pain score 1-3) (or Fever >/= 101).   albuterol  (2.5 MG/3ML) 0.083% nebulizer solution Commonly known as: PROVENTIL  Take 2.5 mg by nebulization daily  as needed for wheezing or shortness of breath.   albuterol  108 (90 Base) MCG/ACT inhaler Commonly known as: VENTOLIN  HFA Inhale 2 puffs into the lungs every 6 (six) hours as needed for wheezing or shortness of breath.   antiseptic oral rinse Liqd Apply 15 mLs topically as needed for dry mouth.   arformoterol  15 MCG/2ML Nebu Commonly known as: BROVANA  Take 2 mLs (15 mcg total) by nebulization 2 (two) times daily.   artificial tears ophthalmic solution Place 1 drop into both eyes 4 (four) times daily as needed for dry eyes.   budesonide  0.25 MG/2ML nebulizer solution Commonly known  as: PULMICORT  Take 2 mLs (0.25 mg total) by nebulization 2 (two) times daily.   glycopyrrolate  1 MG tablet Commonly known as: ROBINUL  Take 1 tablet (1 mg total) by mouth every 4 (four) hours as needed (excessive secretions).   guaiFENesin -dextromethorphan  100-10 MG/5ML syrup Commonly known as: ROBITUSSIN DM Take 5 mLs by mouth every 4 (four) hours as needed for cough.   HYDROcodone -acetaminophen  5-325 MG tablet Commonly known as: NORCO/VICODIN Take 1 tablet by mouth every 4 (four) hours as needed for moderate pain (pain score 4-6) or severe pain (pain score 7-10).   levothyroxine  112 MCG tablet Commonly known as: SYNTHROID  TAKE 1 TABLET BY MOUTH DAILY   LORazepam  1 MG tablet Commonly known as: ATIVAN  Take 1 tablet (1 mg total) by mouth every 4 (four) hours as needed for anxiety.   nystatin  100000 UNIT/ML suspension Commonly known as: MYCOSTATIN  Take 5 mLs (500,000 Units total) by mouth 4 (four) times daily.   ondansetron  4 MG tablet Commonly known as: ZOFRAN  Take 1 tablet (4 mg total) by mouth every 6 (six) hours as needed for nausea.   pantoprazole  40 MG tablet Commonly known as: PROTONIX  TAKE 1 TABLET BY MOUTH DAILY  BEFORE BREAKFAST   senna-docusate 8.6-50 MG tablet Commonly known as: Senokot-S Take 1 tablet by mouth at bedtime as needed for mild constipation.   spironolactone  25 MG tablet Commonly known as: ALDACTONE  Take 0.5 tablets (12.5 mg total) by mouth daily.   torsemide  20 MG tablet Commonly known as: DEMADEX  Take 1 tablet (20 mg total) by mouth daily. Take 2 tablets daily in case of weight gain 2 to 3 lbs in 24 hrs or 5 lbs in 7 days   traZODone  50 MG tablet Commonly known as: DESYREL  Take 0.5 tablets (25 mg total) by mouth at bedtime as needed for sleep.        Follow-up Information     [name], MD Follow up.   Specialty: Family Medicine Contact information: [address] [place] [address] [phone]          [facility] [place]. [facility] Follow up on 08/26/2024.   Specialty: Infectious Diseases Why: 09/22/2024 @ 3:00 pm with Corean to check in on rash progress / abx tolerance, Hospital Discharge Follow Up Contact information: [address] [place], [address] [place]  [address] [phone]        Follow up with Hospice services Follow up.                 Discharge Exam: Filed Weights   08/27/24 0418 08/28/24 0500 08/29/24 [id number]  Weight: 81.2 kg 80.8 kg 82.3 kg   General exam: Awake, laying in bed, in nad Respiratory system: Normal respiratory effort, no wheezing Cardiovascular system: regular rate, s1, s2 Gastrointestinal system: Soft, nondistended, positive  BS Central nervous system: CN2-12 grossly intact, strength intact Extremities: Perfused, no clubbing Skin: Normal skin turgor, no notable skin lesions seen Psychiatry: Mood normal // no visual hallucinations   Condition at discharge: stable  The results of significant diagnostics from this hospitalization (including imaging, microbiology, ancillary and laboratory) are listed below for reference.   Imaging Studies: CT ABDOMEN PELVIS W CONTRAST Result Date: 08/29/2024 EXAM: CT ABDOMEN AND PELVIS WITH CONTRAST 08/29/2024 01:11:57 AM TECHNIQUE: CT of the abdomen and pelvis was performed with the administration of intravenous contrast. 75 mL (iohexol  (OMNIPAQUE ) 350 MG/ML injection 75 mL IOHEXOL  350 MG/ML SOLN) was administered. Multiplanar reformatted images are provided for review. Automated exposure control, iterative reconstruction, and/or weight-based adjustment of the mA/kV was utilized to reduce the radiation dose to as low as reasonably achievable. COMPARISON: CT with iv contrast 11/29/2023 and 07/13/2024. CLINICAL HISTORY: Persistent bacteremia. FINDINGS: LOWER CHEST: Small symmetric layering pleural effusions, both increased. Posterior basilar opacities which  could be due to atelectasis or aspiration with additional hazy lower lobe opacities which could be from low lung volumes or pneumonitis versus edema. The more anterior lung bases are clear. There is severe cardiomegaly. Small but circumferential pericardial effusion. There is pacemaker and AICD wiring in the heart. LIVER: Capsular nodularity of the left lobe of the liver is again noted suggesting at least early cirrhosis. The liver is 20 cm in length with no mass enhancement. GALLBLADDER AND BILE DUCTS: There is a subcentimeter stone in the distal gallbladder, surrounded by dense sludge. No gallbladder wall thickening or biliary dilatation. SPLEEN: No acute abnormality. PANCREAS: The pancreas is partially atrophic without mass. ADRENAL GLANDS: No acute abnormality. KIDNEYS, URETERS AND BLADDER: No stones in the kidneys or ureters. No hydronephrosis. No perinephric or periureteral stranding. There is no renal mass. There is a 2.1 cm parapelvic Bosniak 1 cyst in the inferior pole of the left kidney, Hounsfield density is 19. No follow-up imaging recommended. Urinary bladder is unremarkable. GI AND BOWEL: Stomach demonstrates no acute abnormality. The appendix is normal. There is mild retained stool in the ascending and transverse colon. Left colonic diverticulosis greatest in sigmoid colon. No evidence of acute diverticulitis. There is interval new thickening in the rectum with perirectal stranding, most likely due to proctitis given the short interval appearance of this. Infiltrating disease would be less likely. There is no bowel obstruction. PERITONEUM AND RETROPERITONEUM: There is trace ascites in the presacral pelvis. No free air. VASCULATURE: Aorta is normal in caliber. Aortic atherosclerosis. LYMPH NODES: No lymphadenopathy. REPRODUCTIVE ORGANS: No acute abnormality. BONES AND SOFT TISSUES: Osteopenia and degenerative change Thoracic and lumbar spine. Continued body wall edema in the flanks and lateral hips.  Post injection changes in the abdominal wall subcutaneous fat. There are small inguinal fat hernias. No acute osseous abnormality. No focal soft tissue abnormality. IMPRESSION: 1. Interval new thickening in the rectum with perirectal stranding, most likely due to proctitis given the short interval appearance. Infiltrating disease is less likely. 2. Posterior basilar opacities and additional hazy lower lobe opacities, which could be due to atelectasis or aspiration, with pneumonitis versus edema also considered. 3. Small symmetric layering pleural effusions, increased compared to prior study. 4. Severe cardiomegaly with small circumferential pericardial effusion. 5. Trace ascites in the presacral pelvis. 6. Early cirrhotic changes in the liver. No splenomegaly. 7. Cholelithiasis. 8. Constipation and diverticulosis. Electronically signed by: Francis Quam MD 08/29/2024 07:49 AM EST RP Workstation: [id number]   DG Chest 2 View Result Date: 08/27/2024 CLINICAL DATA:  Congestive  heart failure EXAM: CHEST - 2 VIEW COMPARISON:  August 17, 2024 FINDINGS: Stable cardiomegaly. Left-sided defibrillator is unchanged. Status post right shoulder arthroplasty. Mild central pulmonary vascular congestion is noted. Possible minimal bibasilar pulmonary edema is noted with small pleural effusions. IMPRESSION: Mild central pulmonary vascular congestion with possible minimal bibasilar pulmonary edema and small pleural effusions. Electronically Signed   By: Lynwood Landy Raddle M.D.   On: 08/27/2024 11:52   VAS US  UPPER EXTREMITY VENOUS DUPLEX Result Date: 08/26/2024 UPPER VENOUS STUDY  Patient Name:  Tanya Hogan  Date of Exam:   08/25/2024 Medical Rec #: [id number]        Accession #:    [id number] Date of Birth: May 31, 1951         Patient Gender: F Patient Age:   73 years Exam Location:  [facility] Procedure:      VAS US  UPPER EXTREMITY VENOUS DUPLEX Referring Phys: COSTIN GHERGHE  --------------------------------------------------------------------------------  Indications: Swelling, and rule out DVT Comparison Study: No prior exam. Performing Technologist: Edilia Elden Appl  Examination Guidelines: A complete evaluation includes B-mode imaging, spectral Doppler, color Doppler, and power Doppler as needed of all accessible portions of each vessel. Bilateral testing is considered an integral part of a complete examination. Limited examinations for reoccurring indications may be performed as noted.  Right Findings: +----------+------------+---------+-----------+----------+-------+ RIGHT     CompressiblePhasicitySpontaneousPropertiesSummary +----------+------------+---------+-----------+----------+-------+ IJV           Full       Yes       Yes                      +----------+------------+---------+-----------+----------+-------+ Subclavian    Full       Yes       Yes                      +----------+------------+---------+-----------+----------+-------+ Axillary      Full       Yes       Yes                      +----------+------------+---------+-----------+----------+-------+ Brachial      Full       Yes       Yes                      +----------+------------+---------+-----------+----------+-------+ Radial        Full                                          +----------+------------+---------+-----------+----------+-------+ Ulnar         Full                                          +----------+------------+---------+-----------+----------+-------+ Cephalic      Full       Yes       Yes                      +----------+------------+---------+-----------+----------+-------+ Basilic       Full       Yes       Yes                      +----------+------------+---------+-----------+----------+-------+  Superficial vein thrombosis noted in the Antecubital vein by IV line.  Left Findings:  +----------+------------+---------+-----------+----------+-------+ LEFT      CompressiblePhasicitySpontaneousPropertiesSummary +----------+------------+---------+-----------+----------+-------+ IJV         Partial      Yes       Yes                      +----------+------------+---------+-----------+----------+-------+ Subclavian    Full       Yes       Yes                      +----------+------------+---------+-----------+----------+-------+ Axillary      Full       Yes       Yes                      +----------+------------+---------+-----------+----------+-------+ Brachial      Full       Yes       Yes                      +----------+------------+---------+-----------+----------+-------+ Radial        Full                                          +----------+------------+---------+-----------+----------+-------+ Ulnar         Full                                          +----------+------------+---------+-----------+----------+-------+ Cephalic    Partial      No        No                       +----------+------------+---------+-----------+----------+-------+ Basilic     Partial      Yes       Yes                      +----------+------------+---------+-----------+----------+-------+ Deep vein thrombosis noted in the left jugular vein. Superficial vein thrombosis noted in the cephalic vein from the antecubital fossa to the the middle forearm and in a short segment of the basilic vein in the distal upper arm.  Summary:  Right: Findings consistent with acute superficial vein thrombosis involving the antecubital vein.  Left: Findings consistent with acute to age indeterminate deep vein thrombosis involving the left internal jugular vein. Findings consistent with acute superficial vein thrombosis involving the left basilic vein and left cephalic vein.  *See table(s) above for measurements and observations.  Diagnosing physician: Penne Colorado MD  Electronically signed by Penne Colorado MD on 08/26/2024 at 10:41:22 AM.    Final    CT HEAD WO CONTRAST ( ) Result Date: 08/25/2024 EXAM: CT HEAD WITHOUT CONTRAST 08/25/2024 12:45:47 PM TECHNIQUE: CT of the head was performed without the administration of intravenous contrast. Automated exposure control, iterative reconstruction, and/or weight based adjustment of the mA/kV was utilized to reduce the radiation dose to as low as reasonably achievable. COMPARISON: CT Head dated 08/24/2024. CLINICAL HISTORY: Stroke, follow up. FINDINGS: BRAIN AND VENTRICLES: No acute hemorrhage. Redemonstration of age indeterminate infarct in the right thalamus. There is a small remote lacunar infarct involving the left corona radiata. Mild atherosclerosis within the  carotid siphons. Mild chronic microvascular ischemic changes. No hydrocephalus. No extra-axial collection. No mass effect or midline shift. ORBITS: Bilateral lens replacement. SINUSES: Paranasal sinus mucosal thickening. SOFT TISSUES AND SKULL: No acute soft tissue abnormality. No skull fracture. IMPRESSION: 1. Redemonstration of age indeterminate infarct in the right thalamus. 2. Small remote lacunar infarct involving the left corona radiata. 3. Mild chronic microvascular ischemic changes. Electronically signed by: Donnice Mania MD 08/25/2024 01:21 PM EST RP Workstation: [id number]   CT ANGIO HEAD NECK W WO CM (CODE STROKE) Result Date: 08/24/2024 EXAM: CTA Head and Neck with Intravenous Contrast. CLINICAL HISTORY: Neuro deficit, acute, stroke suspected. TECHNIQUE: Axial CTA images of the head and neck performed with intravenous contrast. MIP reconstructed images were created and reviewed. Axial computed tomography images of the head/brain performed without intravenous contrast. Note: Per PQRS, the description of internal carotid artery percent stenosis, including 0 percent or normal exam, is based on [employer] Symptomatic Carotid Endarterectomy Trial (NASCET)  criteria. Dose reduction technique was used including one or more of the following: automated exposure control, adjustment of mA and kV according to patient size, and/or iterative reconstruction. CONTRAST: With and without. COMPARISON: CT head 08/24/2024. FINDINGS: CTA NECK: COMMON CAROTID ARTERIES: Mild atherosclerosis at the right carotid bifurcation without hemodynamically significant stenosis. Mild atherosclerosis at the left carotid bifurcation without hemodynamically significant stenosis. No dissection or occlusion. INTERNAL CAROTID ARTERIES: No stenosis by NASCET criteria. No dissection or occlusion. VERTEBRAL ARTERIES: The right vertebral artery is dominant. The vertebral artery origins are slightly obscured due to streak artifact from arthroplasty hardware as well as artifact from dense venous contrast. Visualized portions of the vertebral arteries are patent to the vertebrobasilar confluence. Mild atherosclerosis of the right V4 segment resulting in mild stenosis. No dissection or occlusion. 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 The anterior cerebral arteries are patent bilaterally and primarily supplied by the left carotid artery. No aneurysm. MIDDLE CEREBRAL ARTERIES: The middle cerebral arteries are patent bilaterally and primarily supplied by the left carotid artery. There is moderate stenosis of a proximal M2 branch of the left MCA. No aneurysm. POSTERIOR CEREBRAL ARTERIES: No significant stenosis. No occlusion. No aneurysm. BASILAR ARTERY: No significant stenosis. No occlusion. No aneurysm. OTHER: Partially visualized left chest wall pacer device with leads noted within the right atrium and ventricle as well as within the coronary sinus. Cardiomegaly. Bilateral pulmonary opacities and bronchovascular thickening which could reflect pulmonary edema. Moderate atherosclerosis at the left subclavian artery origin without  significant stenosis. SOFT TISSUES: There is a 0.9 cm nodular focus of enhancing soft tissue in the right aspect of the vallecula abutting the epiglottis and the glossoepiglottic fold. Recommend nonemergent correlation with direct visualization. No masses or lymphadenopathy. BONES: Right shoulder arthroplasty hardware is partially visualized. No acute osseous abnormality. IMPRESSION: 1. No acute large vessel occlusion. 2. Moderate stenosis of a proximal M2 branch of the left MCA. 3. Mild stenosis of the left cavernous and supraclinoid ICA. 4. Mild atherosclerosis of the right V4 segment resulting in mild stenosis. 5. 0.9 cm nodular focus of enhancing soft tissue in the right aspect of the vallecula abutting the epiglottis and the glossoepiglottic fold. Recommend nonemergent ENT evaluation with direct visualization. Electronically signed by: Donnice Mania MD 08/24/2024 04:28 PM EST RP Workstation: [id number]   CT HEAD CODE STROKE WO CONTRAST Result Date: 08/24/2024 EXAM: CT HEAD WITHOUT CONTRAST 08/24/2024 03:45:00 PM TECHNIQUE: CT of the head was performed without the administration  of intravenous contrast. Automated exposure control, iterative reconstruction, and/or weight based adjustment of the mA/kV was utilized to reduce the radiation dose to as low as reasonably achievable. COMPARISON: 08/14/2023 CLINICAL HISTORY: Neuro deficit, acute, stroke suspected. FINDINGS: BRAIN AND VENTRICLES: Mild chronic microvascular ischemic changes. Small age indeterminate lacunar infarct in the right thalamus. Atherosclerosis of the carotid siphons and intracranial right vertebral artery. No acute hemorrhage. No hydrocephalus. No extra-axial collection. No mass effect or midline shift. ORBITS: Bilateral lens replacement. SINUSES: Postsurgical changes of the left ethmoid sinus and left aspect of the nasal cavity. Mucosal thickening throughout the paranasal sinuses with findings suggestive of left sided chronic sinusitis. SOFT  TISSUES AND SKULL: No acute soft tissue abnormality. No skull fracture. [place] Stroke Program Early CT (ASPECT) score: Ganglionic (caudate, ic, lentiform nucleus, insula, M1-m3): 7 Supraganglionic (m4-m6): 3 Total: 10 IMPRESSION: 1. No acute intracranial hemorrhage. 2. ASPECT score 10. 3. Small age indeterminate lacunar infarct in the right thalamus. 4. Findings messaged to Dr. Matthews via the [facility] messaging system at 3:57 PM on 08/24/24. Electronically signed by: Donnice Mania MD 08/24/2024 03:58 PM EST RP Workstation: [id number]   US  EKG SITE RITE Result Date: 08/23/2024 If Site Rite image not attached, placement could not be confirmed due to current cardiac rhythm.  DG CHEST PORT 1 VIEW Result Date: 08/17/2024 CLINICAL DATA:  Central line placement. EXAM: PORTABLE CHEST 1 VIEW COMPARISON:  Radiograph yesterday FINDINGS: Placement of left internal jugular central line. The tip overlies multiple left-sided pacemaker wires in the region of the innominate/SVC confluence. No pneumothorax. Multi lead left-sided pacemaker in place. Stable cardiomegaly. Mild vascular congestion. No large pleural effusion. No focal airspace disease. Right shoulder arthroplasty. IMPRESSION: 1. Placement of left internal jugular central line with tip overlying the innominate/SVC confluence. No pneumothorax. 2. Stable cardiomegaly and vascular congestion. Electronically Signed   By: Andrea Gasman M.D.   On: 08/17/2024 17:54   ECHOCARDIOGRAM COMPLETE Result Date: 08/17/2024    ECHOCARDIOGRAM REPORT   Patient Name:   Tanya Hogan Date of Exam: 08/17/2024 Medical Rec #:  [id number]       Height:       61.0 in Accession #:    [id number]      Weight:       172.5 lb Date of Birth:  Nov 23, 1950        BSA:          1.774 m Patient Age:    73 years        BP:           103/65 mmHg Patient Gender: F               HR:           89 bpm. Exam Location:  Inpatient Procedure: 2D Echo (Both Spectral and Color Flow Doppler were utilized during             procedure). Indications:    acute systolic chf  History:        Patient has prior history of Echocardiogram examinations, most                 recent 05/13/2024. Pacemaker and Defibrillator, Pulmonary HTN and                 Cirrhosis; Risk Factors:Diabetes, Hypertension and Sleep Apnea.  Sonographer:    Tinnie Barefoot RDCS Referring Phys: [id number] [facility] J PATWARDHAN IMPRESSIONS  1. Left ventricular ejection fraction, by estimation, is <20%. The left ventricle  has severely decreased function. The left ventricle demonstrates global hypokinesis. The left ventricular internal cavity size was severely dilated. Left ventricular diastolic parameters are consistent with Grade III diastolic dysfunction (restrictive).  2. Right ventricular systolic function is moderately reduced. The right ventricular size is moderately enlarged. There is moderately elevated pulmonary artery systolic pressure. The estimated right ventricular systolic pressure is 48.4 mmHg.  3. Left atrial size was severely dilated.  4. Right atrial size was severely dilated.  5. A small pericardial effusion is present. The pericardial effusion is anterior to the right ventricle. There is no evidence of cardiac tamponade.  6. The mitral valve is normal in structure. Trivial mitral valve regurgitation. No evidence of mitral stenosis.  7. The aortic valve is tricuspid. There is mild calcification of the aortic valve. Aortic valve regurgitation is not visualized. Aortic valve sclerosis/calcification is present, without any evidence of aortic stenosis.  8. The inferior vena cava is dilated in size with <50% respiratory variability, suggesting right atrial pressure of 15 mmHg. FINDINGS  Left Ventricle: Left ventricular ejection fraction, by estimation, is <20%. The left ventricle has severely decreased function. The left ventricle demonstrates global hypokinesis. The left ventricular internal cavity size was severely dilated. There is no left  ventricular hypertrophy. Left ventricular diastolic parameters are consistent with Grade III diastolic dysfunction (restrictive). Right Ventricle: The right ventricular size is moderately enlarged. No increase in right ventricular wall thickness. Right ventricular systolic function is moderately reduced. There is moderately elevated pulmonary artery systolic pressure. The tricuspid  regurgitant velocity is 2.89 m/s, and with an assumed right atrial pressure of 15 mmHg, the estimated right ventricular systolic pressure is 48.4 mmHg. Left Atrium: Left atrial size was severely dilated. Right Atrium: Right atrial size was severely dilated. Pericardium: A small pericardial effusion is present. The pericardial effusion is anterior to the right ventricle. There is no evidence of cardiac tamponade. Mitral Valve: The mitral valve is normal in structure. Trivial mitral valve regurgitation. No evidence of mitral valve stenosis. Tricuspid Valve: The tricuspid valve is normal in structure. Tricuspid valve regurgitation is not demonstrated. No evidence of tricuspid stenosis. Aortic Valve: The aortic valve is tricuspid. There is mild calcification of the aortic valve. Aortic valve regurgitation is not visualized. Aortic valve sclerosis/calcification is present, without any evidence of aortic stenosis. Pulmonic Valve: The pulmonic valve was normal in structure. Pulmonic valve regurgitation is trivial. No evidence of pulmonic stenosis. Aorta: The aortic root is normal in size and structure. Venous: The inferior vena cava is dilated in size with less than 50% respiratory variability, suggesting right atrial pressure of 15 mmHg. IAS/Shunts: No atrial level shunt detected by color flow Doppler. Additional Comments: A device lead is visualized.  LEFT VENTRICLE PLAX 2D LVOT diam:     2.10 cm      Diastology LV SV:         32           LV e' medial:   9.57 cm/s LV SV Index:   18           LV E/e' medial: 10.1 LVOT Area:     3.46 cm  LV  Volumes (MOD) LV vol d, MOD A2C: 266.0 ml LV vol d, MOD A4C: 179.0 ml LV vol s, MOD A2C: 231.0 ml LV vol s, MOD A4C: 163.0 ml LV SV MOD A2C:     35.0 ml LV SV MOD A4C:     179.0 ml LV SV MOD BP:  31.3 ml RIGHT VENTRICLE          IVC RV Basal diam:  3.40 cm  IVC diam: 2.30 cm TAPSE (M-mode): 2.1 cm LEFT ATRIUM              Index        RIGHT ATRIUM           Index LA Vol (A2C):   116.0 ml 65.40 ml/m  RA Area:     22.80 cm LA Vol (A4C):   71.2 ml  40.14 ml/m  RA Volume:   68.80 ml  38.79 ml/m LA Biplane Vol: 96.8 ml  54.58 ml/m  AORTIC VALVE             PULMONIC VALVE LVOT Vmax:   67.70 cm/s  PR End Diast Vel: 8.18 msec LVOT Vmean:  39.900 cm/s LVOT VTI:    0.092 m  AORTA Ao Root diam: 3.00 cm Ao Asc diam:  3.20 cm MITRAL VALVE               TRICUSPID VALVE MV Area (PHT): 6.17 cm    TR Peak grad:   33.4 mmHg MV Decel Time: 123 msec    TR Vmax:        289.00 cm/s MV E velocity: 96.70 cm/s MV A velocity: 80.50 cm/s  SHUNTS MV E/A ratio:  1.20        Systemic VTI:  0.09 m                            Systemic Diam: 2.10 cm Toribio Fuel MD Electronically signed by Toribio Fuel MD Signature Date/Time: 08/17/2024/4:42:35 PM    Final    DG Chest 2 View Result Date: 08/16/2024 EXAM: 2 VIEW(S) XRAY OF THE CHEST 08/16/2024 07:25:00 AM COMPARISON: 08/14/2024 CLINICAL HISTORY: 74 year old female with shortness of breath. FINDINGS: LINES, TUBES AND DEVICES: Left chest ICD noted. LUNGS AND PLEURA: Progressed pulmonary vascularity. No edema. No confluent lung opacity. No pleural effusion. No pneumothorax. HEART AND MEDIASTINUM: Cardiomegaly. BONES AND SOFT TISSUES: Right shoulder arthroplasty noted. Multilevel degenerative changes of thoracic spine. No acute osseous abnormality. IMPRESSION: 1. No acute cardiopulmonary abnormality. Electronically signed by: Helayne Hurst MD 08/16/2024 07:30 AM EST RP Workstation: [id number]   DG Chest 2 View Result Date: 08/14/2024 CLINICAL DATA:  Cough for 1 week EXAM: CHEST -  2 VIEW COMPARISON:  08/02/2024, CT 07/13/2024, chest x-ray 06/05/2024 FINDINGS: Right shoulder replacement. Left-sided multi lead pacing device, similar compared to prior. Cardiomegaly without focal airspace disease, pleural effusion or pneumothorax. IMPRESSION: No active cardiopulmonary disease. Cardiomegaly. Electronically Signed   By: Luke Bun M.D.   On: 08/14/2024 16:59   CUP PACEART REMOTE DEVICE CHECK Result Date: 08/03/2024 ICD: Scheduled remote reviewed. Normal device function.  Presenting rhythm: AP/VP 32 NSVT, V>A, V-rate 125-167 bpm, duration 1-2 sec Next remote transmission per protocol. ML, CVRS  DG Chest 2 View Result Date: 08/02/2024 CLINICAL DATA:  Chest pressure. EXAM: CHEST - 2 VIEW COMPARISON:  Chest radiograph dated 06/05/2024 FINDINGS: There is cardiomegaly with vascular congestion. No focal consolidation, pleural effusion, pneumothorax. Left pectoral AICD device. No acute osseous pathology. Right shoulder arthroplasty. IMPRESSION: Cardiomegaly with vascular congestion. No focal consolidation. Electronically Signed   By: Vanetta Chou M.D.   On: 08/02/2024 20:37    Microbiology: Results for orders placed or performed during the hospital encounter of 08/16/24  Resp panel by RT-PCR (RSV, Flu A&B, Covid) Anterior Nasal Swab  Status: None   Collection Time: 08/16/24  8:19 AM   Specimen: Anterior Nasal Swab  Result Value Ref Range Status   SARS Coronavirus 2 by RT PCR NEGATIVE NEGATIVE Final   Influenza A by PCR NEGATIVE NEGATIVE Final   Influenza B by PCR NEGATIVE NEGATIVE Final    Comment: (NOTE) The Xpert Xpress SARS-CoV-2/FLU/RSV plus assay is intended as an aid in the diagnosis of influenza from Nasopharyngeal swab specimens and should not be used as a sole basis for treatment. Nasal washings and aspirates are unacceptable for Xpert Xpress SARS-CoV-2/FLU/RSV testing.  Fact Sheet for Patients: bloggercourse.com  Fact Sheet for  Healthcare Providers: seriousbroker.it  This test is not yet approved or cleared by the [place]  FDA and has been authorized for detection and/or diagnosis of SARS-CoV-2 by FDA under an Emergency Use Authorization (EUA). This EUA will remain in effect (meaning this test can be used) for the duration of the COVID-19 declaration under Section 564(b)(1) of the Act, 21 U.S.C. section 360bbb-3(b)(1), unless the authorization is terminated or revoked.     Resp Syncytial Virus by PCR NEGATIVE NEGATIVE Final    Comment: (NOTE) Fact Sheet for Patients: bloggercourse.com  Fact Sheet for Healthcare Providers: seriousbroker.it  This test is not yet approved or cleared by the [place]  FDA and has been authorized for detection and/or diagnosis of SARS-CoV-2 by FDA under an Emergency Use Authorization (EUA). This EUA will remain in effect (meaning this test can be used) for the duration of the COVID-19 declaration under Section 564(b)(1) of the Act, 21 U.S.C. section 360bbb-3(b)(1), unless the authorization is terminated or revoked.  Performed at [facility], [address] [place], [place] [address]   MRSA Next Gen by PCR, Nasal     Status: Abnormal   Collection Time: 08/17/24  4:51 PM   Specimen: Nasal Mucosa; Nasal Swab  Result Value Ref Range Status   MRSA by PCR Next Gen DETECTED (A) NOT DETECTED Final    Comment: RESULT CALLED TO, READ BACK BY AND VERIFIED WITH: L BROWN RN 08/17/2024 @ 2142 BY AB (NOTE) The GeneXpert MRSA Assay (FDA approved for NASAL specimens only), is one component of a comprehensive MRSA colonization surveillance program. It is not intended to diagnose MRSA infection nor to guide or monitor treatment for MRSA infections. Test performance is not FDA approved in patients less than 56 years old. Performed at [facility], [address] [place], [place] [address]    Culture, blood (Routine X 2) w Reflex to ID Panel     Status: Abnormal   Collection Time: 08/17/24  5:58 PM   Specimen: BLOOD LEFT HAND  Result Value Ref Range Status   Specimen Description BLOOD LEFT HAND  Final   Special Requests   Final    BOTTLES DRAWN AEROBIC AND ANAEROBIC Blood Culture adequate volume   Culture  Setup Time   Final    GRAM POSITIVE COCCI IN BOTH AEROBIC AND ANAEROBIC BOTTLES CRITICAL RESULT CALLED TO, READ BACK BY AND VERIFIED WITH: PHARMD J.FRENS AT 0802 ON 08/18/2024 BY T.SAAD.    Culture (A)  Final    METHICILLIN RESISTANT STAPHYLOCOCCUS AUREUS Sent to [facility] for further susceptibility testing. Performed at [facility], [address] [place], [place] [address]    Report Status 08/21/2024 FINAL  Final   Organism ID, Bacteria METHICILLIN RESISTANT STAPHYLOCOCCUS AUREUS  Final      Susceptibility   Methicillin resistant staphylococcus aureus - MIC*  CIPROFLOXACIN  >=8 RESISTANT Resistant     ERYTHROMYCIN  >=8 RESISTANT Resistant     GENTAMICIN  <=0.5 SENSITIVE Sensitive     OXACILLIN >=4 RESISTANT Resistant     TETRACYCLINE <=1 SENSITIVE Sensitive     VANCOMYCIN  1 SENSITIVE Sensitive     TRIMETH/SULFA <=10 SENSITIVE Sensitive     CLINDAMYCIN  <=0.25 SENSITIVE Sensitive     RIFAMPIN <=0.5 SENSITIVE Sensitive     Inducible Clindamycin  NEGATIVE Sensitive     LINEZOLID 2 SENSITIVE Sensitive     * METHICILLIN RESISTANT STAPHYLOCOCCUS AUREUS  Blood Culture ID Panel (Reflexed)     Status: Abnormal   Collection Time: 08/17/24  5:58 PM  Result Value Ref Range Status   Enterococcus faecalis NOT DETECTED NOT DETECTED Final   Enterococcus Faecium NOT DETECTED NOT DETECTED Final   Listeria monocytogenes NOT DETECTED NOT DETECTED Final   Staphylococcus species DETECTED (A) NOT DETECTED Final    Comment: CRITICAL RESULT CALLED TO, READ BACK BY AND VERIFIED WITH: PHARMD J.FRENS AT 0802 ON 08/18/2024 BY T.SAAD.    Staphylococcus aureus (BCID) DETECTED (A) NOT  DETECTED Final    Comment: Methicillin (oxacillin)-resistant Staphylococcus aureus (MRSA). MRSA is predictably resistant to beta-lactam antibiotics (except ceftaroline ). Preferred therapy is vancomycin  unless clinically contraindicated. Patient requires contact precautions if  hospitalized. CRITICAL RESULT CALLED TO, READ BACK BY AND VERIFIED WITH: PHARMD J.FRENS AT 0802 ON 08/18/2024 BY T.SAAD.    Staphylococcus epidermidis NOT DETECTED NOT DETECTED Final   Staphylococcus lugdunensis NOT DETECTED NOT DETECTED Final   Streptococcus species NOT DETECTED NOT DETECTED Final   Streptococcus agalactiae NOT DETECTED NOT DETECTED Final   Streptococcus pneumoniae NOT DETECTED NOT DETECTED Final   Streptococcus pyogenes NOT DETECTED NOT DETECTED Final   A.calcoaceticus-baumannii NOT DETECTED NOT DETECTED Final   Bacteroides fragilis NOT DETECTED NOT DETECTED Final   Enterobacterales NOT DETECTED NOT DETECTED Final   Enterobacter cloacae complex NOT DETECTED NOT DETECTED Final   Escherichia coli NOT DETECTED NOT DETECTED Final   Klebsiella aerogenes NOT DETECTED NOT DETECTED Final   Klebsiella oxytoca NOT DETECTED NOT DETECTED Final   Klebsiella pneumoniae NOT DETECTED NOT DETECTED Final   Proteus species NOT DETECTED NOT DETECTED Final   Salmonella species NOT DETECTED NOT DETECTED Final   Serratia marcescens NOT DETECTED NOT DETECTED Final   Haemophilus influenzae NOT DETECTED NOT DETECTED Final   Neisseria meningitidis NOT DETECTED NOT DETECTED Final   Pseudomonas aeruginosa NOT DETECTED NOT DETECTED Final   Stenotrophomonas maltophilia NOT DETECTED NOT DETECTED Final   Candida albicans NOT DETECTED NOT DETECTED Final   Candida auris NOT DETECTED NOT DETECTED Final   Candida glabrata NOT DETECTED NOT DETECTED Final   Candida krusei NOT DETECTED NOT DETECTED Final   Candida parapsilosis NOT DETECTED NOT DETECTED Final   Candida tropicalis NOT DETECTED NOT DETECTED Final   Cryptococcus  neoformans/gattii NOT DETECTED NOT DETECTED Final   Meth resistant mecA/C and MREJ DETECTED (A) NOT DETECTED Final    Comment: CRITICAL RESULT CALLED TO, READ BACK BY AND VERIFIED WITH: PHARMD J.FRENS AT 0802 ON 08/18/2024 BY T.SAAD. Performed at [facility], [address] [place], [place] [address]   MIC (1 Drug)-     Status: Abnormal   Collection Time: 08/17/24  5:58 PM  Result Value Ref Range Status   Min Inhibitory Conc (1 Drug) Final report (A)  Corrected    Comment: (NOTE) Performed At: [facility] [address] [place], [place] [id number] Jennette Shorter MD Ey:[id number] CORRECTED ON 01/02 AT  1335: PREVIOUSLY REPORTED AS Preliminary report    Source LAB [id number] MRSA DAPTO BLOOS  Corrected    Comment: Performed at [facility], [address] [place], [place] [address] CORRECTED ON 12/30 AT 0800: PREVIOUSLY REPORTED AS BLOOD   MIC Result     Status: Abnormal   Collection Time: 08/17/24  5:58 PM  Result Value Ref Range Status   Result 1 (MIC) Comment (A)  Final    Comment: (NOTE) Methicillin - resistant Staphylococcus aureus Identification performed by account, not confirmed by this laboratory. Testing performed by broth microdilution. DAPTOMYCIN   0.5 ug/mL  SUSCEPTIBLE Performed At: [facility] [address] [place], [place] [id number] Jennette Shorter MD Ey:[id number]   Culture, blood (Routine X 2) w Reflex to ID Panel     Status: Abnormal   Collection Time: 08/17/24  6:01 PM   Specimen: BLOOD RIGHT HAND  Result Value Ref Range Status   Specimen Description BLOOD RIGHT HAND  Final   Special Requests   Final    BOTTLES DRAWN AEROBIC AND ANAEROBIC Blood Culture results may not be optimal due to an inadequate volume of blood received in culture bottles   Culture  Setup Time   Final    GRAM POSITIVE COCCI IN BOTH AEROBIC AND ANAEROBIC BOTTLES CRITICAL VALUE NOTED.  VALUE IS CONSISTENT WITH PREVIOUSLY REPORTED AND CALLED VALUE.    Culture  (A)  Final    STAPHYLOCOCCUS AUREUS SUSCEPTIBILITIES PERFORMED ON PREVIOUS CULTURE WITHIN THE LAST 5 DAYS. Performed at [facility], [address] [place], [place] [address]    Report Status 08/21/2024 FINAL  Final  Culture, blood (Routine X 2) w Reflex to ID Panel     Status: Abnormal   Collection Time: 08/19/24 11:15 AM   Specimen: BLOOD  Result Value Ref Range Status   Specimen Description BLOOD BLOOD RIGHT ARM  Final   Special Requests   Final    BOTTLES DRAWN AEROBIC AND ANAEROBIC Blood Culture results may not be optimal due to an inadequate volume of blood received in culture bottles   Culture  Setup Time   Final    GRAM POSITIVE COCCI IN CLUSTERS IN BOTH AEROBIC AND ANAEROBIC BOTTLES Gram Stain Report Called to,Read Back By and Verified With: PHARMD G ABBOTT 08/20/2024 @ 0552 BY AB CRITICAL VALUE NOTED.  VALUE IS CONSISTENT WITH PREVIOUSLY REPORTED AND CALLED VALUE.    Culture (A)  Final    STAPHYLOCOCCUS AUREUS SUSCEPTIBILITIES PERFORMED ON PREVIOUS CULTURE WITHIN THE LAST 5 DAYS. Performed at [facility], [address], [place], [place] [address]    Report Status 08/22/2024 FINAL  Final  Culture, blood (Routine X 2) w Reflex to ID Panel     Status: Abnormal   Collection Time: 08/19/24 11:15 AM   Specimen: BLOOD LEFT ARM  Result Value Ref Range Status   Specimen Description BLOOD LEFT ARM  Final   Special Requests   Final    BOTTLES DRAWN AEROBIC ONLY Blood Culture results may not be optimal due to an inadequate volume of blood received in culture bottles   Culture  Setup Time   Final    GRAM POSITIVE COCCI IN CLUSTERS AEROBIC BOTTLE ONLY CRITICAL VALUE NOTED.  VALUE IS CONSISTENT WITH PREVIOUSLY REPORTED AND CALLED VALUE.    Culture (A)  Final    STAPHYLOCOCCUS AUREUS SUSCEPTIBILITIES PERFORMED ON PREVIOUS CULTURE WITHIN THE LAST 5 DAYS. Performed at [facility], [address], [place], [place] [address]  Report Status 08/22/2024 FINAL   Final  Culture, blood (Routine X 2) w Reflex to ID Panel     Status: Abnormal   Collection Time: 08/21/24 10:46 AM   Specimen: BLOOD  Result Value Ref Range Status   Specimen Description BLOOD BLOOD RIGHT ARM  Final   Special Requests   Final    BOTTLES DRAWN AEROBIC AND ANAEROBIC Blood Culture adequate volume   Culture  Setup Time   Final    GRAM POSITIVE COCCI ANAEROBIC BOTTLE ONLY CRITICAL VALUE NOTED.  VALUE IS CONSISTENT WITH PREVIOUSLY REPORTED AND CALLED VALUE.    Culture (A)  Final    STAPHYLOCOCCUS AUREUS SUSCEPTIBILITIES PERFORMED ON PREVIOUS CULTURE WITHIN THE LAST 5 DAYS. Performed at [facility], [address] [place], [place] [address]    Report Status 08/26/2024 FINAL  Final  Culture, blood (Routine X 2) w Reflex to ID Panel     Status: None   Collection Time: 08/21/24 10:46 AM   Specimen: BLOOD  Result Value Ref Range Status   Specimen Description BLOOD BLOOD LEFT HAND  Final   Special Requests   Final    BOTTLES DRAWN AEROBIC AND ANAEROBIC Blood Culture results may not be optimal due to an inadequate volume of blood received in culture bottles   Culture   Final    NO GROWTH 5 DAYS Performed at [facility], [address] [place], [place] [address]    Report Status 08/26/2024 FINAL  Final  Culture, blood (Routine X 2) w Reflex to ID Panel     Status: Abnormal   Collection Time: 08/25/24 10:03 AM   Specimen: BLOOD  Result Value Ref Range Status   Specimen Description BLOOD BLOOD RIGHT HAND  Final   Special Requests   Final    AEROBIC BOTTLE ONLY Blood Culture results may not be optimal due to an inadequate volume of blood received in culture bottles   Culture  Setup Time   Final    GRAM POSITIVE COCCI AEROBIC BOTTLE ONLY CRITICAL VALUE NOTED.  VALUE IS CONSISTENT WITH PREVIOUSLY REPORTED AND CALLED VALUE. Performed at [facility], [address] [place], [place] [address]    Culture METHICILLIN RESISTANT STAPHYLOCOCCUS AUREUS (A)   Final   Report Status 08/29/2024 FINAL  Final   Organism ID, Bacteria METHICILLIN RESISTANT STAPHYLOCOCCUS AUREUS  Final      Susceptibility   Methicillin resistant staphylococcus aureus - MIC*    CIPROFLOXACIN  >=8 RESISTANT Resistant     ERYTHROMYCIN  >=8 RESISTANT Resistant     GENTAMICIN  <=0.5 SENSITIVE Sensitive     OXACILLIN >=4 RESISTANT Resistant     TETRACYCLINE <=1 SENSITIVE Sensitive     VANCOMYCIN  <=0.5 SENSITIVE Sensitive     TRIMETH/SULFA <=10 SENSITIVE Sensitive     CLINDAMYCIN  <=0.25 SENSITIVE Sensitive     RIFAMPIN <=0.5 SENSITIVE Sensitive     Inducible Clindamycin  NEGATIVE Sensitive     LINEZOLID 2 SENSITIVE Sensitive     * METHICILLIN RESISTANT STAPHYLOCOCCUS AUREUS  Culture, blood (Routine X 2) w Reflex to ID Panel     Status: None   Collection Time: 08/25/24 10:03 AM   Specimen: BLOOD  Result Value Ref Range Status   Specimen Description BLOOD BLOOD LEFT HAND  Final   Special Requests AEROBIC BOTTLE ONLY Blood Culture adequate volume  Final   Culture   Final    NO GROWTH 5 DAYS Performed at [facility], [address], [place], [place] [address]  Report Status 08/30/2024 FINAL  Final  MIC (1 Drug)-     Status: None (Preliminary result)   Collection Time: 08/25/24 10:03 AM  Result Value Ref Range Status   Min Inhibitory Conc (1 Drug) PENDING  Incomplete   Source BLOOD CULTURE  Final    Comment: Performed at [facility], [address], [place], [place] [address]  Culture, blood (Routine X 2) w Reflex to ID Panel     Status: None (Preliminary result)   Collection Time: 08/28/24 11:47 AM   Specimen: BLOOD LEFT ARM  Result Value Ref Range Status   Specimen Description BLOOD LEFT ARM  Final   Special Requests   Final    BOTTLES DRAWN AEROBIC ONLY Blood Culture adequate volume   Culture   Final    NO GROWTH 2 DAYS Performed at [facility], [address] [place], [place] [address]    Report Status PENDING  Incomplete  Culture, blood  (Routine X 2) w Reflex to ID Panel     Status: None (Preliminary result)   Collection Time: 08/28/24 11:52 AM   Specimen: BLOOD RIGHT HAND  Result Value Ref Range Status   Specimen Description BLOOD RIGHT HAND  Final   Special Requests   Final    BOTTLES DRAWN AEROBIC ONLY Blood Culture results may not be optimal due to an inadequate volume of blood received in culture bottles   Culture   Final    NO GROWTH 2 DAYS Performed at [facility], [address] [place], [place] [address]    Report Status PENDING  Incomplete  Culture, blood (Routine X 2) w Reflex to ID Panel     Status: None (Preliminary result)   Collection Time: 08/29/24 12:16 PM   Specimen: BLOOD RIGHT HAND  Result Value Ref Range Status   Specimen Description BLOOD RIGHT HAND  Final   Special Requests   Final    BOTTLES DRAWN AEROBIC AND ANAEROBIC Blood Culture adequate volume   Culture   Final    NO GROWTH < 24 HOURS Performed at [facility], [address], [place], [place] [address]    Report Status PENDING  Incomplete   *Note: Due to a large number of results and/or encounters for the requested time period, some results have not been displayed. A complete set of results can be found in Results Review.    Labs: CBC: Recent Labs  Lab 08/24/24 0522 08/26/24 1120 08/27/24 0737 08/28/24 0402 08/29/24 0349  WBC 11.9* 17.5* 12.8* 10.3 9.5  HGB 12.8 12.5 11.7* 11.0* 11.2*  HCT 38.8 37.2 35.7* 33.1* 33.9*  MCV 92.6 91.9 94.4 93.0 93.1  PLT 228 270 233 260 262   Basic Metabolic Panel: Recent Labs  Lab 08/24/24 0522 08/26/24 1120 08/27/24 0737 08/28/24 0402 08/29/24 0349  NA 133* 129* 130* 132* 133*  K 4.8 5.1 4.5 4.3 4.1  CL 99 97* 97* 99 98  CO2 26 24 22 26 26   GLUCOSE 117* 128* 174* 95 95  BUN 12 13 18 16 16   CREATININE 0.71 0.73 0.92 0.82 0.85  CALCIUM  9.9 9.6 9.1 8.7* 8.3*  MG 1.9 2.1  --  2.0 2.0   Liver Function Tests: Recent Labs  Lab 08/24/24 0522 08/26/24 1120  08/28/24 0402 08/29/24 0349  AST 83* 88* 64* 60*  ALT 55* 48* 36 34  ALKPHOS 205* 209* 166* 153*  BILITOT 0.7 1.1 0.8 0.9  PROT 6.0* 6.2* 5.5* 5.7*  ALBUMIN 2.9* 2.9* 2.6* 2.7*   CBG: Recent Labs  Lab 08/28/24 0625 08/28/24 1240 08/28/24 1542 08/28/24 2106 08/29/24 0619  GLUCAP 108* 119* 121* 97 96    Discharge time spent: less than 30 minutes.  Signed: Garnette Pelt, MD [facility] 08/30/2024 "

## 2024-08-30 NOTE — TOC Transition Note (Signed)
 Transition of Care (TOC) - Discharge Note Rayfield Gobble RN, BSN Inpatient Care Management Unit 4E- RN Case Manager See Treatment Team for direct phone #   Patient Details  Name: Tanya Hogan MRN: [id number] Date of Birth: Jun 06, 1951  Transition of Care [facility]) CM/SW Contact:  Gobble Rayfield Hurst, RN Phone Number: 08/30/2024, 12:52 PM   Clinical Narrative:    Notified by [facility] liaison that pt/family have completed needed paperwork for transition to [facility], pt has been approved and bed available today for transition to Hospice.   GOLD DNR on chart Paperwork for transport on chart.   1300- [facility] called for transport - per dispatch ETA for pickup is within the hour or a bit over. Bedside RN updated.   IP CM interventions have been completed no further needs noted.   Final next level of care: Hospice Medical Facility Barriers to Discharge: Barriers Resolved   Patient Goals and CMS Choice Patient states their goals for this hospitalization and ongoing recovery are:: Hospice   Choice offered to / list presented to : Patient, Adult Children      Discharge Placement                 Hospice IP facility       Discharge Plan and Services Additional resources added to the After Visit Summary for     Discharge Planning Services: CM Consult Post Acute Care Choice: Hospice          DME Arranged: N/A DME Agency: NA       HH Arranged: Disease Management HH Agency: [facility] Date [facility] Agency Contacted: 08/29/24 Time HH Agency Contacted: 1427 Representative spoke with at [facility] Agency: Magdalena Berber  Social Drivers of Health (SDOH) Interventions SDOH Screenings   Food Insecurity: No Food Insecurity (08/16/2024)  Housing: Low Risk (08/16/2024)  Transportation Needs: No Transportation Needs (08/16/2024)  Recent Concern: Transportation Needs - Unmet Transportation Needs (08/09/2024)  Utilities: Not At Risk (08/16/2024)  Recent  Concern: Utilities - At Risk (07/07/2024)   Received from [facility]  Alcohol  Screen: Low Risk (08/10/2023)  Depression (PHQ2-9): Low Risk (08/14/2024)  Financial Resource Strain: Low Risk (07/07/2024)   Received from [facility]  Physical Activity: Sufficiently Active (08/10/2023)  Recent Concern: Physical Activity - Insufficiently Active (07/12/2023)  Social Connections: Moderately Integrated (08/16/2024)  Stress: No Stress Concern Present (06/22/2024)   Received from [facility]  Tobacco Use: Medium Risk (08/16/2024)  Health Literacy: Adequate Health Literacy (08/10/2023)     Readmission Risk Interventions    08/30/2024   12:52 PM 08/04/2024    1:33 PM 07/14/2024   11:11 AM  Readmission Risk Prevention Plan  Transportation Screening Complete Complete Complete  PCP or Specialist Appt within 3-5 Days  Complete Complete  HRI or Home Care Consult  Complete Complete  Palliative Care Screening  Not Applicable Not Applicable  Medication Review (RN Care Manager) Complete Complete Complete  PCP or Specialist appointment within 3-5 days of discharge Complete    HRI or Home Care Consult Complete    SW Recovery Care/Counseling Consult Complete    Palliative Care Screening Complete    Skilled Nursing Facility Not Applicable

## 2024-08-30 NOTE — Telephone Encounter (Signed)
 Pt's daughters forms faxed confirmation received and scanned into her mothers chart.

## 2024-08-30 NOTE — Progress Notes (Signed)
 [facility] report called to nurse

## 2024-08-31 ENCOUNTER — Ambulatory Visit (HOSPITAL_COMMUNITY)

## 2024-08-31 ENCOUNTER — Other Ambulatory Visit: Payer: Self-pay

## 2024-08-31 NOTE — Patient Outreach (Signed)
 Social Drivers of Health  Community Resource and Care Coordination Visit Note   08/31/2024  Name: Tanya Hogan MRN: [id number] DOB:01/29/1951  Situation: Referral received for [facility] needs assessment and assistance related to Transportation. I obtained verbal consent from Patient.  Visit completed with Patient on the phone.   Background:      Assessment:   BSW conducted outreach to the patient to follow up on the [facility] application. During the call, the patient reported that she has been admitted into hospice care. Given this update, no further follow-up is needed at this time. BSW will close the case accordingly.   Goals Addressed             This Visit's Progress    COMPLETED: BSW Goals       Current SDOH Barriers:  Transportation  Interventions: Patient interviewed and appropriate screenings performed Referred patient to community resources  Provided patient with information about [facility] Advised patient to complete application sent by [facility] and return. BSW will follow up to confirm submission.          Recommendation:   attend all scheduled provider appointments call for transportation assistance at least one week before appointments ask for help if you don't understand your health insurance benefits  Follow Up Plan:   Patient has achieved all patient stated goals. [employer] will be closed. Patient has been provided contact information should new needs arise.   Orlean Fey, BSW [place]  [facility] Social Worker, [facility] [phone]

## 2024-08-31 NOTE — Progress Notes (Incomplete)
 "  ADVANCED HF CLINIC NOTE  Primary Care: [name], MD Primary Cardiologist: Redell Shallow, MD  HPI: Tanya Hogan is a 74 y.o. female with hypertension, hyperlipidemia, type 2 diabetes, nonischemic cardiomyopathy, obstructive sleep apnea on CPAP, complete heart block status post CRT-P and history of breast cancer status postmastectomy presenting today to establish care.  According to chart review, patient has longstanding history of chronic systolic heart failure secondary to nonischemic cardiomyopathy with device placement in 2004.  She had an upgrade for CRT-D roughly around 2013 and her last ischemic evaluation was in June 2021; at that time she had normal coronaries with an EF of 35 to 40%.  In January 2023 she was at admitted to [facility] health with decompensated heart failure with echocardiogram demonstrating EF of 20% at that time she was started on low-dose GDMT however stopped some medications due to fatigue and hypotension.  She was most recently admitted to [facility] from January 5 to 11, 2024 possibly secondary to ventricular tachycardia.  Repeat echocardiogram at that time with an EF of 25 to 30%.  She most recently had an appointment with Dr. Hobart who mention to her that she has terminal heart failure   Last seen in HF Clinic 4/24.  Admitted mid December for a/c CHF. Re-admitted 12/25-08/30/24 with MRSA bacteremia. Echo showed EF <20%, G3DD, mod RV dysfunction. She was treated with IV antibiotics and deemed not a candidate for ICD lead extraction given end-stage HF and poor prognosis. Palliative Care was consulted and decision was made to transition to hospice.     Past Medical History:  Diagnosis Date   Alkaline phosphatase elevation    Asthma    Breast cancer (HCC) 2011   Carpal tunnel syndrome    bilateral   Chronic HFrEF (heart failure with reduced ejection fraction) (HCC)    Cirrhosis, non-alcoholic (HCC)    Closed fracture of unspecified part of radius  (alone)    DDD (degenerative disc disease), lumbar    Depression    pt denies   Diabetes mellitus without complication (HCC)    Enlarged heart    Fibromyalgia    GERD (gastroesophageal reflux disease)    History of kidney stones    HTN (hypertension)    Hypothyroidism    IBS (irritable bowel syndrome)    LBBB (left bundle branch block)    Microcytic anemia 05/14/2017   Mitral regurgitation    NICM (nonischemic cardiomyopathy) (HCC)    Pneumonia    Presence of permanent cardiac pacemaker    Pulmonary hypertension (HCC)    PVD (peripheral vascular disease)    2019   Sleep apnea    uses cpap   Tricuspid regurgitation    Ventricular tachyarrhythmia (HCC)     Current Outpatient Medications  Medication Sig Dispense Refill   acetaminophen  (TYLENOL ) 325 MG tablet Take 2 tablets (650 mg total) by mouth every 6 (six) hours as needed for mild pain (pain score 1-3) (or Fever >/= 101).     albuterol  (PROVENTIL ) (2.5 MG/3ML) 0.083% nebulizer solution Take 2.5 mg by nebulization daily as needed for wheezing or shortness of breath.     albuterol  (VENTOLIN  HFA) 108 (90 Base) MCG/ACT inhaler Inhale 2 puffs into the lungs every 6 (six) hours as needed for wheezing or shortness of breath. 18 g 1   antiseptic oral rinse (BIOTENE) LIQD Apply 15 mLs topically as needed for dry mouth.     arformoterol  (BROVANA ) 15 MCG/2ML NEBU Take 2 mLs (15 mcg total) by  nebulization 2 (two) times daily.     artificial tears ophthalmic solution Place 1 drop into both eyes 4 (four) times daily as needed for dry eyes.     budesonide  (PULMICORT ) 0.25 MG/2ML nebulizer solution Take 2 mLs (0.25 mg total) by nebulization 2 (two) times daily.     glycopyrrolate  (ROBINUL ) 1 MG tablet Take 1 tablet (1 mg total) by mouth every 4 (four) hours as needed (excessive secretions).     guaiFENesin -dextromethorphan  (ROBITUSSIN DM) 100-10 MG/5ML syrup Take 5 mLs by mouth every 4 (four) hours as needed for cough.      HYDROcodone -acetaminophen  (NORCO/VICODIN) 5-325 MG tablet Take 1 tablet by mouth every 4 (four) hours as needed for moderate pain (pain score 4-6) or severe pain (pain score 7-10).     levothyroxine  (SYNTHROID ) 112 MCG tablet TAKE 1 TABLET BY MOUTH DAILY 100 tablet 0   LORazepam  (ATIVAN ) 1 MG tablet Take 1 tablet (1 mg total) by mouth every 4 (four) hours as needed for anxiety.     nystatin  (MYCOSTATIN ) 100000 UNIT/ML suspension Take 5 mLs (500,000 Units total) by mouth 4 (four) times daily.     ondansetron  (ZOFRAN ) 4 MG tablet Take 1 tablet (4 mg total) by mouth every 6 (six) hours as needed for nausea.     pantoprazole  (PROTONIX ) 40 MG tablet TAKE 1 TABLET BY MOUTH DAILY  BEFORE BREAKFAST 90 tablet 3   senna-docusate (SENOKOT-S) 8.6-50 MG tablet Take 1 tablet by mouth at bedtime as needed for mild constipation.     spironolactone  (ALDACTONE ) 25 MG tablet Take 0.5 tablets (12.5 mg total) by mouth daily.     torsemide  (DEMADEX ) 20 MG tablet Take 1 tablet (20 mg total) by mouth daily. Take 2 tablets daily in case of weight gain 2 to 3 lbs in 24 hrs or 5 lbs in 7 days 30 tablet 0   traZODone  (DESYREL ) 50 MG tablet Take 0.5 tablets (25 mg total) by mouth at bedtime as needed for sleep.     No current facility-administered medications for this visit.    Allergies[1]    Social History   Socioeconomic History   Marital status: Widowed    Spouse name: Not on file   Number of children: 2   Years of education: 14   Highest education level: Associate degree: academic program  Occupational History   Occupation: quality insurcance in sports coach    Comment: retired  Tobacco Use   Smoking status: Former    Current packs/day: 0.00    Types: Cigarettes    Quit date: 08/24/1966    Years since quitting: 58.0   Smokeless tobacco: Never  Vaping Use   Vaping status: Never Used  Substance and Sexual Activity   Alcohol  use: No   Drug use: No   Sexual activity: Not Currently  Other Topics Concern    Not on file  Social History Narrative   Retired. Lives with and helps her brother. Likes risk manager, nutritional therapist and music. Loves animals.   Social Drivers of Health   Tobacco Use: Medium Risk (08/16/2024)   Patient History    Smoking Tobacco Use: Former    Smokeless Tobacco Use: Never    Passive Exposure: Not on file  Financial Resource Strain: Low Risk (07/07/2024)   Received from [facility]   Overall Financial Resource Strain (CARDIA)    How hard is it for you to pay for the very basics like food, housing, medical care, and heating?: Not hard at all  Food  Insecurity: No Food Insecurity (08/16/2024)   Epic    Worried About Programme Researcher, Broadcasting/film/video in the Last Year: Never true    Ran Out of Food in the Last Year: Never true  Transportation Needs: No Transportation Needs (08/16/2024)   Epic    Lack of Transportation (Medical): No    Lack of Transportation (Non-Medical): No  Recent Concern: Transportation Needs - Unmet Transportation Needs (08/09/2024)   Epic    Lack of Transportation (Medical): Yes    Lack of Transportation (Non-Medical): Yes  Physical Activity: Sufficiently Active (08/10/2023)   Exercise Vital Sign    Days of Exercise per Week: 3 days    Minutes of Exercise per Session: 50 min  Recent Concern: Physical Activity - Insufficiently Active (07/12/2023)   Exercise Vital Sign    Days of Exercise per Week: 1 day    Minutes of Exercise per Session: 10 min  Stress: No Stress Concern Present (06/22/2024)   Received from [facility] of Occupational Health - Occupational Stress Questionnaire    Do you feel stress - tense, restless, nervous, or anxious, or unable to sleep at night because your mind is troubled all the time - these days?: Not at all  Social Connections: Moderately Integrated (08/16/2024)   Social Connection and Isolation Panel    Frequency of Communication with Friends and Family: More than three times a week    Frequency of  Social Gatherings with Friends and Family: More than three times a week    Attends Religious Services: More than 4 times per year    Active Member of [employer] or Organizations: Yes    Attends Banker Meetings: 1 to 4 times per year    Marital Status: Widowed  Intimate Partner Violence: Not At Risk (08/16/2024)   Epic    Fear of Current or Ex-Partner: No    Emotionally Abused: No    Physically Abused: No    Sexually Abused: No  Depression (PHQ2-9): Low Risk (08/14/2024)   Depression (PHQ2-9)    PHQ-2 Score: 4  Alcohol  Screen: Low Risk (08/10/2023)   Alcohol  Screen    Last Alcohol  Screening Score (AUDIT): 0  Housing: Low Risk (08/16/2024)   Epic    Unable to Pay for Housing in the Last Year: No    Number of Times Moved in the Last Year: 0    Homeless in the Last Year: No  Utilities: Not At Risk (08/16/2024)   Epic    Threatened with loss of utilities: No  Recent Concern: Utilities - At Risk (07/07/2024)   Received from [facility]    In the past 12 months has the electric, gas, oil, or water  company threatened to shut off services in your home?: Yes  Health Literacy: Adequate Health Literacy (08/10/2023)   B1300 Health Literacy    Frequency of need for help with medical instructions: Never      Family History  Problem Relation Age of Onset   Heart disease Mother    Melanoma Mother    Parkinson's disease Mother    Heart disease Father    Heart disease Brother    Heart failure Other    Breast cancer Other    Hypertension Other     There were no vitals filed for this visit.  There were no vitals filed for this visit.   ECG: 12/14/22: BiV paced rhythm as per my interpretation   ECHO: 10/30/22: LVEF 20-25%, moderately reduced RV  function. As per my personal interpretation 01/08/22: LVEF 20 to 25%, reduced RV function.  PHYSICAL EXAM: General: *** appearing. No distress  Cardiac: JVP ~*** cm. No murmurs  Resp: Lung sounds clear and equal  B/L Abdomen: Soft, non-distended.  Extremities: Warm and dry.  *** edema.  Neuro: A&O x3. Affect pleasant.   ECG:  ASSESSMENT & PLAN:  Heart failure with reduced ejection fraction Etiology of HF: Nonischemic cardiomyopathc NYHA class / AHA Stage: NYHA III, significantly limited by lower extremity arthritis and deconditioning along with likely low output heart failure Volume status & Diuretics: Euvolemic, bumex  1mg  BID Vasodilators: Unable to tolerate vasodilators Beta-Blocker:discontinue coreg , start digoxin  125mcg daily with digoxin  level and BMP next week.  FMJ:tpoo start at follow up.  Cardiometabolic:will discontinue jardiance ; reports feeling very fatigued and dehydrated with it. Devices therapies & Valvulopathies: BiV CRT-P in place Advanced therapies: Unfortunately due to her multiple comorbidities including deconditioning, arthritis, dependent on [place] for ambulation, minimal social support (she is the primary caretaker for her 15 year old brother), Tanya Hogan is not a candidate for any advanced therapies including LVAD or home inotropes.  We discussed this today.  At this time we will focus on symptom control and attempt to improve her overall functional status.  Will stop carvedilol  and start digoxin  125 mcg daily.  She is euvolemic on exam.  We discussed importance of increasing diuretics as needed.   2.HTN -Now hypotensive without the need for vasodilators   3.T2DM -A1c relatively well-controlled   4.HLD -Continue private statin   5.COPD - questionable hx of COPD - stable today, no wheezing on exam.    6.Breast Ca s/p mastectomy -No signs of recurrence  Follow up in *** with ***   Merci Walthers, NP 08/31/2024      [1]  Allergies Allergen Reactions   Injectafer [Ferric Carboxymaltose] Shortness Of Breath    Mild SOB following injectafer infusion    Penicillins Anaphylaxis and Rash    Tolerated ceftin  2020   Accupril [Quinapril Hcl] Other (See Comments)     Hyperkalemia=high potassium level   Celebrex [Celecoxib] Other (See Comments)    Hx hematemesis, bleeding ulcer   Cozaar  [Losartan  Potassium] Itching   Dml Forte Rash   Entresto [Sacubitril-Valsartan] Other (See Comments)    Hyperkalemia   Flonase  [Fluticasone ] Other (See Comments)    Hx nasal ulcer   Lactose Intolerance (Gi) Diarrhea   Lipitor [Atorvastatin ] Other (See Comments)    Myalgias    Nasonex  [Mometasone  Furoate] Other (See Comments)    Epistaxis    Nsaids Other (See Comments)    Hx hematemesis, bleeding ulcer   Advil [Ibuprofen] Nausea And Vomiting    Hx hematemesis, bleeding ulcer   Baking Soda-Fluoride [Sodium Fluoride] Itching and Rash   Bayer Aspirin  [Aspirin ] Other (See Comments)    Hx hematemesis, bleeding ulcer   Chamomile Hives   Clindamycin /Lincomycin Rash   Codeine Nausea And Vomiting   Lasix  [Furosemide ] Other (See Comments)    Tinnitus  Hypotension   Miralax  [Polyethylene Glycol (Macrogol)] Other (See Comments)    Leaky gut syndrome   Quinine Rash   Reclast  [Zoledronic  Acid] Rash and Other (See Comments)    Tremors    Rifadin [Rifampin] Other (See Comments)    Unknown reaction   Singulair  [Montelukast ] Other (See Comments)    Dizziness    Sulfa Antibiotics Rash   Ultram  [Tramadol ] Anxiety and Rash   Vesicare  [Solifenacin ] Other (See Comments)    Urinary retention   Wellbutrin  [Bupropion ] Other (See  Comments)    Myalgias  Skin crawling sensation   Wool Alcohol  [Lanolin] Rash   "

## 2024-08-31 NOTE — Patient Instructions (Signed)
 Visit Information  Thank you for taking time to visit with me today. Please don't hesitate to contact me if I can be of assistance to you before our next scheduled appointment.  Your next care management appointment is no further scheduled appointments.    Patient has met all care management goals. Care Management case will be closed. Patient has been provided contact information should new needs arise.   Please call the care guide team at [phone] if you need to cancel, schedule, or reschedule an appointment.   Please call the Suicide and Crisis Lifeline: 988 call the [place]  National Suicide Prevention Lifeline: 346-028-6558 or TTY: 512-796-3977 TTY 623-049-5590) to talk to a trained counselor call 1-800-273-TALK (toll free, 24 hour hotline) go to [facility] [address] [place] [phone]) call 911 if you are experiencing a Mental Health or Behavioral Health Crisis or need someone to talk to.  [place], BSW [place]  [facility] Social Worker, [facility] [phone]

## 2024-08-31 NOTE — Progress Notes (Signed)
 This encounter was created in error - please disregard.

## 2024-09-02 ENCOUNTER — Ambulatory Visit: Payer: Self-pay | Admitting: Infectious Diseases

## 2024-09-02 LAB — CULTURE, BLOOD (ROUTINE X 2)
Culture: NO GROWTH
Culture: NO GROWTH
Special Requests: ADEQUATE

## 2024-09-02 LAB — MIC RESULT

## 2024-09-02 LAB — MINIMUM INHIBITORY CONC. (1 DRUG)

## 2024-09-03 LAB — CULTURE, BLOOD (ROUTINE X 2)
Culture: NO GROWTH
Special Requests: ADEQUATE

## 2024-09-04 ENCOUNTER — Ambulatory Visit: Payer: Self-pay | Admitting: Infectious Diseases

## 2024-09-04 ENCOUNTER — Telehealth: Payer: Self-pay | Admitting: Family Medicine

## 2024-09-04 NOTE — Telephone Encounter (Signed)
 Pt daughter everlina) states Ms. Philipps passed away yesterday at 3:20am.

## 2024-09-12 ENCOUNTER — Ambulatory Visit: Admitting: Family Medicine

## 2024-09-13 ENCOUNTER — Ambulatory Visit: Admitting: Family Medicine

## 2024-09-18 ENCOUNTER — Ambulatory Visit: Admitting: Internal Medicine

## 2024-09-24 DEATH — deceased

## 2024-09-25 ENCOUNTER — Ambulatory Visit: Admitting: Cardiology

## 2024-10-05 ENCOUNTER — Ambulatory Visit: Admitting: Student in an Organized Health Care Education/Training Program

## 2024-10-26 ENCOUNTER — Ambulatory Visit: Admitting: Family Medicine

## 2024-10-31 ENCOUNTER — Encounter

## 2024-12-14 ENCOUNTER — Ambulatory Visit: Admitting: Family Medicine

## 2025-01-30 ENCOUNTER — Encounter

## 2025-05-01 ENCOUNTER — Encounter

## 2025-07-31 ENCOUNTER — Encounter
